# Patient Record
Sex: Male | Born: 1950 | Race: White | Hispanic: No | Marital: Single | Smoking: Never smoker
Health system: Western US, Academic
[De-identification: ages and names within clinical notes are randomized; demographics above are authoritative.]

## PROBLEM LIST (undated history)

## (undated) ENCOUNTER — Ambulatory Visit (HOSPITAL_BASED_OUTPATIENT_CLINIC_OR_DEPARTMENT_OTHER): Payer: Self-pay | Admitting: Anesthesiology

## (undated) ENCOUNTER — Ambulatory Visit (HOSPITAL_COMMUNITY): Payer: Self-pay | Admitting: Urology

## (undated) ENCOUNTER — Encounter (HOSPITAL_BASED_OUTPATIENT_CLINIC_OR_DEPARTMENT_OTHER): Admission: RE | Payer: Self-pay | Source: Ambulatory Visit

## (undated) ENCOUNTER — Ambulatory Visit (HOSPITAL_BASED_OUTPATIENT_CLINIC_OR_DEPARTMENT_OTHER): Payer: Medicaid Other | Admitting: Pain Medicine

## (undated) ENCOUNTER — Encounter (HOSPITAL_BASED_OUTPATIENT_CLINIC_OR_DEPARTMENT_OTHER): Payer: Self-pay

## (undated) ENCOUNTER — Ambulatory Visit (HOSPITAL_BASED_OUTPATIENT_CLINIC_OR_DEPARTMENT_OTHER): Payer: Self-pay | Admitting: Vascular & Interventional Radiology

## (undated) ENCOUNTER — Encounter (HOSPITAL_COMMUNITY): Payer: Self-pay

## (undated) ENCOUNTER — Ambulatory Visit (HOSPITAL_BASED_OUTPATIENT_CLINIC_OR_DEPARTMENT_OTHER): Payer: Medicare Other | Admitting: Anesthesiology

## (undated) ENCOUNTER — Ambulatory Visit (HOSPITAL_BASED_OUTPATIENT_CLINIC_OR_DEPARTMENT_OTHER): Admission: RE | Payer: Medicare Other | Source: Ambulatory Visit

## (undated) DIAGNOSIS — M25511 Pain in right shoulder: Secondary | ICD-10-CM

## (undated) DIAGNOSIS — I700 Atherosclerosis of aorta: Secondary | ICD-10-CM

## (undated) DIAGNOSIS — Z87891 Personal history of nicotine dependence: Secondary | ICD-10-CM

## (undated) DIAGNOSIS — R011 Cardiac murmur, unspecified: Secondary | ICD-10-CM

## (undated) DIAGNOSIS — J449 Chronic obstructive pulmonary disease, unspecified: Secondary | ICD-10-CM

## (undated) DIAGNOSIS — I6529 Occlusion and stenosis of unspecified carotid artery: Secondary | ICD-10-CM

## (undated) DIAGNOSIS — K5730 Diverticulosis of large intestine without perforation or abscess without bleeding: Secondary | ICD-10-CM

## (undated) DIAGNOSIS — I351 Nonrheumatic aortic (valve) insufficiency: Secondary | ICD-10-CM

## (undated) DIAGNOSIS — T7840XA Allergy, unspecified, initial encounter: Secondary | ICD-10-CM

## (undated) DIAGNOSIS — K760 Fatty (change of) liver, not elsewhere classified: Secondary | ICD-10-CM

## (undated) DIAGNOSIS — I2510 Atherosclerotic heart disease of native coronary artery without angina pectoris: Secondary | ICD-10-CM

## (undated) DIAGNOSIS — M1990 Unspecified osteoarthritis, unspecified site: Secondary | ICD-10-CM

## (undated) DIAGNOSIS — E785 Hyperlipidemia, unspecified: Secondary | ICD-10-CM

## (undated) DIAGNOSIS — K5792 Diverticulitis of intestine, part unspecified, without perforation or abscess without bleeding: Secondary | ICD-10-CM

## (undated) DIAGNOSIS — G510 Bell's palsy: Principal | ICD-10-CM

## (undated) DIAGNOSIS — R51 Headache: Secondary | ICD-10-CM

## (undated) DIAGNOSIS — Q620 Congenital hydronephrosis: Secondary | ICD-10-CM

## (undated) DIAGNOSIS — N289 Disorder of kidney and ureter, unspecified: Secondary | ICD-10-CM

## (undated) DIAGNOSIS — Z9359 Other cystostomy status: Secondary | ICD-10-CM

## (undated) DIAGNOSIS — M109 Gout, unspecified: Secondary | ICD-10-CM

## (undated) DIAGNOSIS — N200 Calculus of kidney: Secondary | ICD-10-CM

## (undated) DIAGNOSIS — H3320 Serous retinal detachment, unspecified eye: Secondary | ICD-10-CM

## (undated) DIAGNOSIS — N35919 Unspecified urethral stricture, male, unspecified site: Secondary | ICD-10-CM

## (undated) DIAGNOSIS — F329 Major depressive disorder, single episode, unspecified: Secondary | ICD-10-CM

## (undated) DIAGNOSIS — M130 Polyarthritis, unspecified: Secondary | ICD-10-CM

## (undated) DIAGNOSIS — R519 Headache, unspecified: Secondary | ICD-10-CM

## (undated) DIAGNOSIS — R319 Hematuria, unspecified: Secondary | ICD-10-CM

## (undated) DIAGNOSIS — M549 Dorsalgia, unspecified: Secondary | ICD-10-CM

## (undated) DIAGNOSIS — G8929 Other chronic pain: Secondary | ICD-10-CM

## (undated) HISTORY — PX: TRANSURETHRAL RESECTION OF PROSTATE: SHX73

## (undated) HISTORY — DX: Headache, unspecified: R51.9

## (undated) HISTORY — PX: APPENDECTOMY: SHX54

## (undated) HISTORY — DX: Headache: R51

## (undated) HISTORY — DX: Polyarthritis, unspecified: M13.0

## (undated) HISTORY — DX: Unspecified urethral stricture, male, unspecified site: N35.919

## (undated) HISTORY — DX: Hematuria, unspecified: R31.9

## (undated) HISTORY — PX: OTHER SURGICAL HISTORY: SHX170

## (undated) HISTORY — DX: Disorder of kidney and ureter, unspecified: N28.9

## (undated) HISTORY — DX: Calculus of kidney: N20.0

## (undated) HISTORY — DX: Congenital hydronephrosis: Q62.0

## (undated) HISTORY — PX: COLONOSCOPY: SHX174

## (undated) HISTORY — PX: CYSTOSCOPY W/ LASER LITHOTRIPSY: SHX1425

## (undated) HISTORY — DX: Other chronic pain: M54.9

## (undated) HISTORY — PX: SPINE SURGERY: SHX786

## (undated) HISTORY — DX: Other chronic pain: G89.29

## (undated) HISTORY — DX: Major depressive disorder, single episode, unspecified: F32.9

## (undated) HISTORY — DX: Other cystostomy status (CMS-HCC): Z93.59

## (undated) HISTORY — PX: CYSTOSCOPY: SHX1422B

## (undated) HISTORY — DX: Serous retinal detachment, unspecified eye: H33.20

## (undated) HISTORY — DX: Gout, unspecified: M10.9

## (undated) HISTORY — DX: Bell's palsy: G51.0

## (undated) HISTORY — DX: Pain in right shoulder: M25.511

## (undated) HISTORY — DX: Cardiac murmur, unspecified: R01.1

## (undated) HISTORY — DX: Personal history of nicotine dependence: Z87.891

## (undated) HISTORY — DX: Atherosclerotic heart disease of native coronary artery without angina pectoris: I25.10

## (undated) HISTORY — DX: Chronic obstructive pulmonary disease, unspecified: J44.9

## (undated) HISTORY — DX: Allergy, unspecified, initial encounter: T78.40XA

## (undated) HISTORY — PX: INCISE AND DRAIN ABCESS: PRO64

## (undated) HISTORY — DX: Diverticulitis of intestine, part unspecified, without perforation or abscess without bleeding: K57.92

## (undated) HISTORY — DX: Fatty (change of) liver, not elsewhere classified: K76.0

## (undated) HISTORY — PX: VASECTOMY: SHX75

## (undated) HISTORY — DX: Diverticulosis of large intestine without perforation or abscess without bleeding: K57.30

## (undated) HISTORY — DX: Nonrheumatic aortic (valve) insufficiency: I35.1

## (undated) HISTORY — DX: Atherosclerosis of aorta: I70.0

## (undated) HISTORY — DX: Hyperlipidemia, unspecified: E78.5

## (undated) HISTORY — DX: Unspecified osteoarthritis, unspecified site: M19.90

## (undated) HISTORY — DX: Occlusion and stenosis of unspecified carotid artery: I65.29

## (undated) SURGERY — PAIN INJECTION, SACROILIAC JOINT
Laterality: Bilateral

## (undated) SURGERY — CYSTOURETHROSCOPY, WITH DIRECT VISION INTERNAL URETHROTOMY
Anesthesia: General | Site: Urethra

## (undated) SURGERY — PAIN ESI LUMBAR TRANSFORAMINAL

## (undated) SURGERY — IR OTPT FLOURO CONSULT
Anesthesia: Local

## (undated) SURGERY — IR TUBE CHECK
Anesthesia: Local | Laterality: Left

## (undated) MED ORDER — ONDANSETRON HCL 4 MG/2ML IV SOLN
4.00 mg | INTRAMUSCULAR | Status: AC | PRN
Start: 2022-03-28 — End: 2022-03-28

## (undated) MED ORDER — EPINEPHRINE 0.3 MG/0.3ML IJ SOAJ
0.30 mg | INTRAMUSCULAR | Status: AC | PRN
Start: 2023-07-27 — End: 2023-07-27

## (undated) MED ORDER — ONDANSETRON HCL 4 MG/2ML IV SOLN
8.00 mg | Freq: Once | INTRAMUSCULAR | Status: AC
Start: 2023-02-17 — End: 2023-02-17

## (undated) MED ORDER — ACETAMINOPHEN 325 MG PO TABS
975.00 mg | ORAL_TABLET | Freq: Three times a day (TID) | ORAL | 0 refills | Status: AC
Start: 2019-09-10 — End: ?

## (undated) MED ORDER — GABAPENTIN 300 MG OR CAPS
300.00 mg | ORAL_CAPSULE | Freq: Three times a day (TID) | ORAL | 0 refills | Status: AC
Start: 2019-09-10 — End: ?

## (undated) MED ORDER — DULOXETINE HCL 20 MG OR CPEP
20.00 mg | ORAL_CAPSULE | Freq: Every day | ORAL | 3 refills | Status: AC
Start: 2022-06-26 — End: ?

## (undated) MED ORDER — SODIUM CHLORIDE 0.9 % IV SOLN
Freq: Once | INTRAVENOUS | Status: AC
Start: 2023-04-13 — End: 2023-03-30

## (undated) MED ORDER — LIDOCAINE ATOMIZER SYRINGE 4% SOLUTION (~~LOC~~)
0.50 mL | Freq: Once | Status: AC
Start: 2023-05-01 — End: 2023-05-01

## (undated) MED ORDER — PREGABALIN 100 MG OR CAPS
ORAL_CAPSULE | ORAL | 0 refills | Status: AC
Start: 2020-12-28 — End: ?

## (undated) MED ORDER — LIDOCAINE ATOMIZER SYRINGE 4% SOLUTION (~~LOC~~)
0.50 mL | Freq: Once | Status: AC
Start: 2023-05-02 — End: 2023-05-02

## (undated) MED ORDER — SODIUM CHLORIDE 0.9 % IV SOLN
INTRAVENOUS | Status: AC | PRN
Start: 2023-06-01 — End: ?

## (undated) MED ORDER — OXYMETAZOLINE HCL 0.05 % NA SOLN
2.00 | Freq: Once | NASAL | Status: AC
Start: 2023-05-02 — End: 2023-05-02

## (undated) MED ORDER — SODIUM CHLORIDE 0.9 % IV BOLUS
500.00 mL | INJECTION | Freq: Once | INTRAVENOUS | Status: AC | PRN
Start: 2023-02-17 — End: ?

## (undated) MED ORDER — SODIUM CHLORIDE 0.9 % IV SOLN
2000.0000 mg | Freq: Once | INTRAVENOUS | Status: AC
Start: 2021-10-26 — End: 2021-10-26

## (undated) MED ORDER — SODIUM CHLORIDE 0.9 % IV SOLN
INTRAVENOUS | Status: AC | PRN
Start: 2023-07-06 — End: ?

## (undated) MED ORDER — PREGABALIN 100 MG OR CAPS
100.0000 mg | ORAL_CAPSULE | Freq: Two times a day (BID) | ORAL | 0 refills | Status: AC
Start: 2023-07-03 — End: ?

## (undated) MED ORDER — PREGABALIN 75 MG OR CAPS
ORAL_CAPSULE | ORAL | 5 refills | Status: AC
Start: 2020-12-28 — End: ?

## (undated) MED ORDER — ONDANSETRON HCL 4 MG/2ML IV SOLN
8.00 mg | Freq: Once | INTRAMUSCULAR | Status: AC
Start: 2023-01-20 — End: 2023-01-20

## (undated) MED ORDER — LACTATED RINGERS IV SOLN
INTRAVENOUS | Status: AC
Start: 2021-04-29 — End: ?

## (undated) MED ORDER — LIDOCAINE VISCOUS 2 % MT SOLN
15.00 mL | Freq: Once | OROMUCOSAL | Status: AC
Start: 2023-05-02 — End: 2023-05-02

## (undated) MED ORDER — HYDROCORTISONE SOD SUCCINATE 100 MG IJ SOLR (CUSTOM)
100.00 mg | Freq: Once | INTRAMUSCULAR | Status: AC | PRN
Start: 2023-01-20 — End: 2023-01-20

## (undated) MED ORDER — ONDANSETRON HCL 4 MG/2ML IV SOLN
8.00 mg | Freq: Once | INTRAMUSCULAR | Status: AC
Start: 2023-06-01 — End: 2023-06-01

## (undated) MED ORDER — HEPARIN SODIUM (PORCINE) 5000 UNIT/ML IJ SOLN
5000.00 [IU] | Freq: Once | INTRAMUSCULAR | Status: AC
Start: 2022-08-18 — End: 2022-08-18

## (undated) MED ORDER — HEPARIN SODIUM (PORCINE) 5000 UNIT/ML IJ SOLN
5000.0000 [IU] | Freq: Once | INTRAMUSCULAR | Status: AC
Start: 2021-05-18 — End: 2021-05-18

## (undated) MED ORDER — ONDANSETRON HCL 4 MG/2ML IV SOLN
8.00 mg | Freq: Once | INTRAMUSCULAR | Status: AC
Start: 2023-04-13 — End: 2023-03-30

## (undated) MED ORDER — SODIUM CHLORIDE 0.9 % IV BOLUS
1000.0000 mL | INJECTION | Freq: Once | INTRAVENOUS | Status: AC
Start: 2023-02-17 — End: 2023-02-17

## (undated) MED ORDER — EPINEPHRINE 0.3 MG/0.3ML IJ SOAJ
0.30 mg | INTRAMUSCULAR | Status: AC | PRN
Start: 2023-02-17 — End: 2023-02-17

## (undated) MED ORDER — DIPHENHYDRAMINE HCL 50 MG/ML IJ SOLN
25.00 mg | Freq: Once | INTRAMUSCULAR | Status: AC | PRN
Start: 2023-07-20 — End: 2023-07-20

## (undated) MED ORDER — SODIUM CHLORIDE 0.9 % IV SOLN
0.75 mg/kg | Freq: Once | INTRAVENOUS | Status: AC
Start: 2023-06-08 — End: 2023-06-08

## (undated) MED ORDER — HEPARIN SODIUM LOCK FLUSH 100 UNIT/ML IJ SOLN
500.00 [IU] | Freq: Once | INTRAVENOUS | Status: AC | PRN
Start: 2022-03-28 — End: ?

## (undated) MED ORDER — ONDANSETRON HCL 4 MG/2ML IV SOLN
8.00 mg | Freq: Once | INTRAMUSCULAR | Status: AC
Start: 2023-07-27 — End: 2023-07-27

## (undated) MED ORDER — CELECOXIB 200 MG OR CAPS
200.00 mg | ORAL_CAPSULE | Freq: Once | ORAL | Status: AC
Start: 2022-08-18 — End: 2022-08-18

## (undated) MED ORDER — LIDOCAINE HCL 2% EX GEL (UROJET)
10.00 mL | Freq: Once | Status: AC | PRN
Start: 2022-04-18 — End: 2022-03-29

## (undated) MED ORDER — CELECOXIB 200 MG OR CAPS
200.00 mg | ORAL_CAPSULE | Freq: Once | ORAL | Status: AC
Start: 2022-05-03 — End: 2022-05-03

## (undated) MED ORDER — FAMOTIDINE (PF) 20 MG/2ML IV SOLN
20.00 mg | Freq: Once | INTRAVENOUS | Status: AC | PRN
Start: 2023-01-27 — End: 2023-01-27

## (undated) MED ORDER — ONDANSETRON HCL 4 MG/2ML IV SOLN
8.00 mg | Freq: Once | INTRAMUSCULAR | Status: AC
Start: 2023-03-03 — End: 2023-03-03

## (undated) MED ORDER — ALLOPURINOL 100 MG OR TABS
100.00 mg | ORAL_TABLET | Freq: Every day | ORAL | 0 refills | Status: AC
Start: 2023-09-18 — End: ?

## (undated) MED ORDER — CELECOXIB 200 MG OR CAPS
200.0000 mg | ORAL_CAPSULE | Freq: Once | ORAL | Status: AC
Start: 2021-05-18 — End: 2021-05-18

## (undated) MED ORDER — LACTATED RINGERS IV SOLN
INTRAVENOUS | Status: AC
Start: 2021-10-26 — End: ?

## (undated) MED ORDER — SODIUM CHLORIDE 0.9 % IV SOLN
Freq: Once | INTRAVENOUS | Status: AC
Start: 2023-07-20 — End: 2023-07-20

## (undated) MED ORDER — DULOXETINE HCL 20 MG OR CPEP
20.0000 mg | ORAL_CAPSULE | Freq: Every day | ORAL | 3 refills | Status: AC
Start: 2022-06-26 — End: ?

## (undated) MED ORDER — OXYMETAZOLINE HCL 0.05 % NA SOLN
2.00 | Freq: Once | NASAL | Status: AC
Start: 2023-05-01 — End: 2023-05-01

## (undated) MED ORDER — SODIUM CHLORIDE 0.9 % IV SOLN
INTRAVENOUS | Status: AC | PRN
Start: 2023-03-11 — End: ?

## (undated) MED ORDER — SODIUM CHLORIDE (PF) 0.9 % IJ SOLN
17.0000 mg | Freq: Once | INTRAVESICAL | Status: AC
Start: 2022-08-23 — End: 2022-08-16

## (undated) MED ORDER — ENFORTUMAB VEDOTIN-EJFV 30 MG IV SOLR
70.00 mg | Freq: Once | INTRAVENOUS | Status: AC
Start: 2023-05-18 — End: 2023-05-04

## (undated) MED ORDER — SODIUM CHLORIDE 0.9 % IV SOLN
Freq: Once | INTRAVENOUS | Status: AC
Start: 2023-01-20 — End: 2023-01-20

## (undated) MED ORDER — FAMOTIDINE (PF) 20 MG/2ML IV SOLN
20.00 mg | Freq: Once | INTRAVENOUS | Status: AC | PRN
Start: 2023-04-06 — End: 2023-03-23

## (undated) MED ORDER — FAMOTIDINE (PF) 20 MG/2ML IV SOLN
20.00 mg | Freq: Once | INTRAVENOUS | Status: AC | PRN
Start: 2023-07-27 — End: 2023-07-27

## (undated) MED ORDER — DIPHENHYDRAMINE HCL 50 MG/ML IJ SOLN
25.00 mg | Freq: Once | INTRAMUSCULAR | Status: AC | PRN
Start: 2023-01-20 — End: 2023-01-20

## (undated) MED ORDER — SODIUM CHLORIDE 0.9 % IV SOLN
1.00 mg/kg | Freq: Once | INTRAVENOUS | Status: AC
Start: 2023-04-06 — End: 2023-03-23

## (undated) MED ORDER — ALBUTEROL SULFATE (5 MG/ML) 0.5% IN NEBU
2.50 mg | INHALATION_SOLUTION | Freq: Once | RESPIRATORY_TRACT | Status: AC | PRN
Start: 2023-01-20 — End: 2023-01-20

## (undated) MED ORDER — OXYCODONE HCL 5 MG OR TABS
5.00 mg | ORAL_TABLET | ORAL | 0 refills | Status: AC | PRN
Start: 2022-06-26 — End: ?

## (undated) MED ORDER — DIPHENHYDRAMINE HCL 50 MG/ML IJ SOLN
25.00 mg | Freq: Once | INTRAMUSCULAR | Status: AC | PRN
Start: 2023-06-08 — End: 2023-06-08

## (undated) MED ORDER — EPINEPHRINE 0.3 MG/0.3ML IJ SOAJ
0.30 mg | INTRAMUSCULAR | Status: AC | PRN
Start: 2023-04-06 — End: 2023-03-23

## (undated) MED ORDER — OXYCODONE HCL 10 MG OR TABS
ORAL_TABLET | ORAL | 0 refills | Status: AC
Start: 2019-10-22 — End: ?

## (undated) MED ORDER — HYDROMORPHONE HCL 1 MG/ML IJ SOLN
0.50 mg | Freq: Once | INTRAMUSCULAR | Status: AC | PRN
Start: 2023-01-20 — End: ?

## (undated) MED ORDER — SODIUM CHLORIDE 0.9 % IV SOLN
200.0000 mg | Freq: Once | INTRAVENOUS | Status: AC
Start: 2023-06-29 — End: 2023-06-29

## (undated) MED ORDER — ALBUTEROL SULFATE (5 MG/ML) 0.5% IN NEBU
2.5000 mg | INHALATION_SOLUTION | Freq: Once | RESPIRATORY_TRACT | Status: AC | PRN
Start: 2023-06-29 — End: 2023-06-29

## (undated) MED ORDER — ENFORTUMAB VEDOTIN-EJFV 30 MG IV SOLR
50.00 mg | Freq: Once | INTRAVENOUS | Status: AC
Start: 2023-07-20 — End: 2023-07-20

## (undated) MED ORDER — DIPHENHYDRAMINE HCL 50 MG/ML IJ SOLN
25.0000 mg | Freq: Once | INTRAMUSCULAR | Status: AC | PRN
Start: 2023-07-06 — End: 2023-07-06

## (undated) MED ORDER — DIPHENHYDRAMINE HCL 50 MG/ML IJ SOLN
25.00 mg | Freq: Once | INTRAMUSCULAR | Status: AC | PRN
Start: 2023-08-11 — End: 2023-08-11

## (undated) MED ORDER — BUSPIRONE HCL 10 MG OR TABS
ORAL_TABLET | ORAL | 0 refills | Status: AC
Start: 2023-07-02 — End: ?

## (undated) MED ORDER — LIDOCAINE VISCOUS 2 % MT SOLN
15.00 mL | Freq: Once | OROMUCOSAL | Status: AC
Start: 2023-05-01 — End: 2023-05-01

## (undated) MED ORDER — CEFAZOLIN SODIUM-DEXTROSE 2-3 GM-%(50ML) IV SOLR
2000.00 mg | Freq: Once | INTRAVENOUS | Status: AC
Start: 2022-05-03 — End: 2022-05-03

## (undated) MED ORDER — EPINEPHRINE 0.3 MG/0.3ML IJ SOAJ
0.30 mg | INTRAMUSCULAR | Status: AC | PRN
Start: 2023-07-20 — End: 2023-07-20

## (undated) MED ORDER — SODIUM CHLORIDE (PF) 0.9 % IJ SOLN
50.0000 mg | Freq: Once | INTRAVESICAL | Status: AC
Start: 2022-08-09 — End: 2022-08-09

## (undated) MED ORDER — SODIUM CHLORIDE 0.9 % IV SOLN
200.00 mg | Freq: Once | INTRAVENOUS | Status: AC
Start: 2023-04-06 — End: 2023-03-23

## (undated) MED ORDER — MITOMYCIN (JELMYTO) 4 MG/ML PYELOCALYCEAL SOLUTION BLADDER INSTILLATION
60.0000 mg | Freq: Once | INTRAVESICAL | Status: AC
Start: 2021-08-05 — End: 2021-08-05

## (undated) MED ORDER — ENFORTUMAB VEDOTIN-EJFV 30 MG IV SOLR
70.00 mg | Freq: Once | INTRAVENOUS | Status: AC
Start: 2023-05-11 — End: 2023-04-27

## (undated) MED ORDER — LIDOCAINE 4 % EX PTCH
1.0000 | MEDICATED_PATCH | CUTANEOUS | 0 refills | Status: AC
Start: 2021-12-06 — End: ?

## (undated) MED ORDER — FAMOTIDINE (PF) 20 MG/2ML IV SOLN
20.00 mg | Freq: Once | INTRAVENOUS | Status: AC | PRN
Start: 2023-07-20 — End: 2023-07-20

## (undated) MED ORDER — SODIUM CHLORIDE (PF) 0.9 % IJ SOLN
50.0000 mg | Freq: Once | INTRAVESICAL | Status: AC
Start: 2022-07-29 — End: 2022-07-29

## (undated) MED ORDER — HYDROMORPHONE HCL 1 MG/ML IJ SOLN
1.00 mg | INTRAMUSCULAR | Status: AC | PRN
Start: 2022-03-28 — End: 2022-03-28

## (undated) MED ORDER — ZOLPIDEM TARTRATE 5 MG OR TABS
5.00 mg | ORAL_TABLET | Freq: Every evening | ORAL | 0 refills | Status: AC | PRN
Start: 2020-06-10 — End: ?

## (undated) MED ORDER — ALBUTEROL SULFATE (5 MG/ML) 0.5% IN NEBU
2.50 mg | INHALATION_SOLUTION | Freq: Once | RESPIRATORY_TRACT | Status: AC | PRN
Start: 2023-02-17 — End: 2023-02-17

## (undated) MED ORDER — ALBUTEROL SULFATE (5 MG/ML) 0.5% IN NEBU
2.50 mg | INHALATION_SOLUTION | Freq: Once | RESPIRATORY_TRACT | Status: AC | PRN
Start: 2023-03-11 — End: 2023-03-10

## (undated) MED ORDER — SODIUM CHLORIDE 0.9 % IV SOLN
200.00 mg | Freq: Once | INTRAVENOUS | Status: AC
Start: 2023-06-01 — End: 2023-06-01

## (undated) MED ORDER — SODIUM CHLORIDE 0.9 % IJ SOLN
50.00 mg | Freq: Once | INTRAMUSCULAR | Status: AC
Start: 2022-03-22 — End: 2022-03-08

## (undated) MED ORDER — LACTATED RINGERS IV SOLN
INTRAVENOUS | Status: AC
Start: 2021-05-18 — End: ?

## (undated) MED ORDER — HYDROCORTISONE SOD SUCCINATE 100 MG IJ SOLR (CUSTOM)
100.00 mg | Freq: Once | INTRAMUSCULAR | Status: AC | PRN
Start: 2023-08-11 — End: 2023-08-11

## (undated) MED ORDER — BCG LIVE 50 MG IS SUSR
50.00 mg | Freq: Once | INTRAVESICAL | Status: AC
Start: 2022-08-16 — End: 2022-08-02

## (undated) MED ORDER — SODIUM CHLORIDE (PF) 0.9 % IJ SOLN
50.0000 mg | Freq: Once | INTRAVESICAL | Status: AC
Start: 2022-08-16 — End: 2022-08-16

## (undated) MED ORDER — SODIUM CHLORIDE 0.9 % IV BOLUS
500.00 mL | INJECTION | Freq: Once | INTRAVENOUS | Status: AC | PRN
Start: 2023-08-11 — End: ?

## (undated) MED ORDER — EPINEPHRINE 0.3 MG/0.3ML IJ SOAJ
0.30 mg | INTRAMUSCULAR | Status: AC | PRN
Start: 2023-01-20 — End: 2023-01-20

## (undated) MED ORDER — SODIUM CHLORIDE 0.9 % IV BOLUS
500.0000 mL | INJECTION | Freq: Once | INTRAVENOUS | Status: AC | PRN
Start: 2023-05-11 — End: ?

## (undated) MED ORDER — HYDROXYZINE HCL 25 MG OR TABS
25.0000 mg | ORAL_TABLET | Freq: Three times a day (TID) | ORAL | 0 refills | Status: AC | PRN
Start: 2023-05-04 — End: ?

## (undated) MED ORDER — EPINEPHRINE 0.3 MG/0.3ML IJ SOAJ
0.30 mg | INTRAMUSCULAR | Status: AC | PRN
Start: 2023-04-13 — End: 2023-03-30

## (undated) MED ORDER — LACTATED RINGERS IV SOLN
INTRAVENOUS | Status: AC
Start: 2021-06-25 — End: ?

## (undated) MED ORDER — PEMBROLIZUMAB 100 MG/4ML IV SOLN
200.00 mg | Freq: Once | INTRAVENOUS | Status: AC
Start: 2023-07-20 — End: 2023-07-20

## (undated) MED ORDER — OXYBUTYNIN CHLORIDE 5 MG OR TABS
5.0000 mg | ORAL_TABLET | Freq: Three times a day (TID) | ORAL | 0 refills | Status: AC
Start: 2020-08-25 — End: ?

## (undated) MED ORDER — ALBUTEROL SULFATE (5 MG/ML) 0.5% IN NEBU
2.50 mg | INHALATION_SOLUTION | Freq: Once | RESPIRATORY_TRACT | Status: AC | PRN
Start: 2023-06-08 — End: 2023-06-08

## (undated) MED ORDER — PREGABALIN 100 MG OR CAPS
ORAL_CAPSULE | ORAL | 0 refills | Status: AC
Start: 2021-11-17 — End: ?

## (undated) MED ORDER — ALBUTEROL SULFATE (5 MG/ML) 0.5% IN NEBU
2.50 mg | INHALATION_SOLUTION | Freq: Once | RESPIRATORY_TRACT | Status: AC | PRN
Start: 2023-04-13 — End: 2023-03-30

## (undated) MED ORDER — SODIUM CHLORIDE 0.9 % IV SOLN
50.00 mg | Freq: Once | INTRAVENOUS | Status: AC
Start: 2023-07-27 — End: 2023-07-27

## (undated) MED ORDER — HYDROCORTISONE SOD SUCCINATE 100 MG IJ SOLR (CUSTOM)
100.00 mg | Freq: Once | INTRAMUSCULAR | Status: AC | PRN
Start: 2023-07-27 — End: 2023-07-27

## (undated) MED ORDER — SENNA 8.6 MG OR TABS
17.20 mg | ORAL_TABLET | Freq: Every morning | ORAL | 0 refills | Status: AC
Start: 2019-09-10 — End: ?

## (undated) MED ORDER — SODIUM CHLORIDE 0.9 % IV SOLN
Freq: Once | INTRAVENOUS | Status: AC
Start: 2023-06-29 — End: 2023-06-29

## (undated) MED ORDER — DIPHENHYDRAMINE HCL 50 MG/ML IJ SOLN
25.00 mg | Freq: Once | INTRAMUSCULAR | Status: AC | PRN
Start: 2023-03-03 — End: 2023-03-03

## (undated) MED ORDER — EPINEPHRINE 0.3 MG/0.3ML IJ SOAJ
0.3000 mg | INTRAMUSCULAR | Status: AC | PRN
Start: 2023-07-06 — End: 2023-07-06

## (undated) MED ORDER — SODIUM CHLORIDE 0.9 % IV SOLN
INTRAVENOUS | Status: AC | PRN
Start: 2023-07-27 — End: ?

## (undated) MED ORDER — BCG LIVE 50 MG IS SUSR
50.00 mg | Freq: Once | INTRAVESICAL | Status: AC
Start: 2022-07-26 — End: 2022-07-12

## (undated) MED ORDER — ACETAMINOPHEN 325 MG PO TABS
975.0000 mg | ORAL_TABLET | Freq: Once | ORAL | Status: AC
Start: 2021-05-18 — End: 2021-05-18

## (undated) MED ORDER — ONDANSETRON HCL 4 MG/2ML IV SOLN
8.0000 mg | Freq: Once | INTRAMUSCULAR | Status: AC
Start: 2023-07-06 — End: 2023-07-06

## (undated) MED ORDER — ALBUTEROL SULFATE (5 MG/ML) 0.5% IN NEBU
2.5000 mg | INHALATION_SOLUTION | Freq: Once | RESPIRATORY_TRACT | Status: AC | PRN
Start: 2023-05-11 — End: 2023-04-27

## (undated) MED ORDER — BUPIVACAINE HCL (PF) 0.25 % IJ SOLN
1.00 mL | Freq: Once | INTRAMUSCULAR | Status: AC
Start: 2018-06-26 — End: 2018-06-26

## (undated) MED ORDER — DIPHENHYDRAMINE HCL 50 MG/ML IJ SOLN
25.00 mg | Freq: Once | INTRAMUSCULAR | Status: AC | PRN
Start: 2023-03-11 — End: 2023-03-10

## (undated) MED ORDER — EPINEPHRINE 0.3 MG/0.3ML IJ SOAJ
0.30 mg | INTRAMUSCULAR | Status: AC | PRN
Start: 2023-03-03 — End: 2023-03-03

## (undated) MED ORDER — OXYCODONE HCL 5 MG OR TABS
ORAL_TABLET | ORAL | 0 refills | Status: AC
Start: 2019-09-10 — End: ?

## (undated) MED ORDER — SODIUM CHLORIDE 0.9 % IV BOLUS
500.00 mL | INJECTION | Freq: Once | INTRAVENOUS | Status: AC | PRN
Start: 2023-03-11 — End: ?

## (undated) MED ORDER — OXYCODONE HCL 5 MG OR TABS
5.0000 mg | ORAL_TABLET | ORAL | 0 refills | Status: AC | PRN
Start: 2020-10-20 — End: ?

## (undated) MED ORDER — EPINEPHRINE 0.3 MG/0.3ML IJ SOAJ
0.3000 mg | INTRAMUSCULAR | Status: AC | PRN
Start: 2023-06-29 — End: 2023-06-29

## (undated) MED ORDER — SODIUM CHLORIDE 0.9 % IV SOLN
1.25 mg/kg | Freq: Once | INTRAVENOUS | Status: AC
Start: 2023-03-10 — End: 2023-03-10

## (undated) MED ORDER — ENFORTUMAB VEDOTIN-EJFV 30 MG IV SOLR
0.75 mg/kg | Freq: Once | INTRAVENOUS | Status: AC
Start: 2023-06-01 — End: 2023-06-01

## (undated) MED ORDER — ALBUTEROL SULFATE (5 MG/ML) 0.5% IN NEBU
2.50 mg | INHALATION_SOLUTION | Freq: Once | RESPIRATORY_TRACT | Status: AC | PRN
Start: 2023-08-11 — End: 2023-08-11

## (undated) MED ORDER — TAMSULOSIN HCL 0.4 MG PO CAPS
0.4000 mg | ORAL_CAPSULE | Freq: Every day | ORAL | 3 refills | Status: AC
Start: 2021-12-31 — End: ?

## (undated) MED ORDER — ALBUTEROL SULFATE (5 MG/ML) 0.5% IN NEBU
2.50 mg | INHALATION_SOLUTION | Freq: Once | RESPIRATORY_TRACT | Status: AC | PRN
Start: 2023-04-06 — End: 2023-03-23

## (undated) MED ORDER — SODIUM CHLORIDE 0.9 % IV SOLN
INTRAVENOUS | Status: AC | PRN
Start: 2023-01-20 — End: ?

## (undated) MED ORDER — ONDANSETRON HCL 4 MG/2ML IV SOLN
8.00 mg | Freq: Once | INTRAMUSCULAR | Status: AC
Start: 2023-07-20 — End: 2023-07-20

## (undated) MED ORDER — BUPIVACAINE HCL (PF) 0.25 % IJ SOLN
10.0000 mL | Freq: Once | INTRAMUSCULAR | Status: AC
Start: 2021-06-28 — End: 2021-06-28

## (undated) MED ORDER — ONDANSETRON HCL 4 MG/2ML IV SOLN
8.00 mg | Freq: Once | INTRAMUSCULAR | Status: AC
Start: 2023-08-11 — End: 2023-08-11

## (undated) MED ORDER — SODIUM CHLORIDE 0.9 % IV SOLN
1.25 mg/kg | Freq: Once | INTRAVENOUS | Status: AC
Start: 2023-02-17 — End: 2023-02-17

## (undated) MED ORDER — HYDROXYZINE PAMOATE 25 MG OR CAPS
25.0000 mg | ORAL_CAPSULE | Freq: Three times a day (TID) | ORAL | 0 refills | Status: AC | PRN
Start: 2023-05-04 — End: ?

## (undated) MED ORDER — SODIUM CHLORIDE (PF) 0.9 % IJ SOLN
50.00 mg | Freq: Once | INTRAMUSCULAR | Status: AC
Start: 2022-08-02 — End: 2022-07-19

## (undated) MED ORDER — HYDROCORTISONE SOD SUCCINATE 100 MG IJ SOLR (CUSTOM)
100.00 mg | Freq: Once | INTRAMUSCULAR | Status: AC | PRN
Start: 2023-03-03 — End: 2023-03-03

## (undated) MED ORDER — HEPARIN SODIUM (PORCINE) 5000 UNIT/ML IJ SOLN
5000.0000 [IU] | Freq: Once | INTRAMUSCULAR | Status: AC
Start: 2021-10-26 — End: 2021-10-26

## (undated) MED ORDER — ZOLPIDEM TARTRATE 5 MG OR TABS
5.0000 mg | ORAL_TABLET | Freq: Every evening | ORAL | 0 refills | Status: AC | PRN
Start: 2020-10-20 — End: ?

## (undated) MED ORDER — ONDANSETRON HCL 4 MG/2ML IV SOLN
8.0000 mg | Freq: Once | INTRAMUSCULAR | Status: AC
Start: 2023-05-11 — End: 2023-04-27

## (undated) MED ORDER — BCG LIVE 50 MG IS SUSR
50.00 mg | Freq: Once | INTRAVESICAL | Status: AC
Start: 2022-04-04 — End: 2022-03-15

## (undated) MED ORDER — HYDROMORPHONE HCL 2 MG OR TABS
2.0000 mg | ORAL_TABLET | ORAL | 0 refills | Status: AC | PRN
Start: 2023-08-07 — End: 2023-11-05

## (undated) MED ORDER — SODIUM CHLORIDE 0.9 % IV SOLN
INTRAVENOUS | Status: AC | PRN
Start: 2023-05-18 — End: ?

## (undated) MED ORDER — LIDOCAINE HCL 2% EX GEL (UROJET)
10.00 mL | Freq: Once | Status: AC | PRN
Start: 2022-04-25 — End: 2022-04-05

## (undated) MED ORDER — IOHEXOL 240 MG/ML IJ SOLN
1.00 mL | Freq: Once | INTRAMUSCULAR | Status: AC
Start: 2017-10-18 — End: 2017-10-18

## (undated) MED ORDER — SODIUM CHLORIDE 0.9 % IV SOLN
Freq: Once | INTRAVENOUS | Status: AC
Start: 2023-05-18 — End: 2023-05-04

## (undated) MED ORDER — ACETAMINOPHEN 325 MG PO TABS
975.0000 mg | ORAL_TABLET | Freq: Once | ORAL | Status: AC
Start: 2021-06-25 — End: 2021-06-25

## (undated) MED ORDER — SODIUM CHLORIDE 0.9 % IV SOLN
50.00 mg | Freq: Once | INTRAVENOUS | Status: AC
Start: 2023-08-11 — End: 2023-08-11

## (undated) MED ORDER — HYDROMORPHONE HCL 2 MG OR TABS
ORAL_TABLET | ORAL | 0 refills | Status: AC
Start: 2023-09-09 — End: ?

## (undated) MED ORDER — FAMOTIDINE (PF) 20 MG/2ML IV SOLN
20.0000 mg | Freq: Once | INTRAVENOUS | Status: AC | PRN
Start: 2023-05-18 — End: 2023-05-04

## (undated) MED ORDER — HYDROCORTISONE SOD SUCCINATE 100 MG IJ SOLR (CUSTOM)
100.00 mg | Freq: Once | INTRAMUSCULAR | Status: AC | PRN
Start: 2023-04-13 — End: 2023-03-30

## (undated) MED ORDER — SODIUM CHLORIDE 0.9 % IV SOLN
Freq: Once | INTRAVENOUS | Status: AC
Start: 2023-06-08 — End: 2023-06-08

## (undated) MED ORDER — SODIUM CHLORIDE 0.9 % IV BOLUS
500.00 mL | INJECTION | Freq: Once | INTRAVENOUS | Status: AC | PRN
Start: 2023-04-13 — End: ?

## (undated) MED ORDER — FAMOTIDINE (PF) 20 MG/2ML IV SOLN
20.00 mg | Freq: Once | INTRAVENOUS | Status: AC | PRN
Start: 2023-02-17 — End: 2023-02-17

## (undated) MED ORDER — SODIUM CHLORIDE 0.9 % IV SOLN
Freq: Once | INTRAVENOUS | Status: AC
Start: 2023-07-06 — End: 2023-07-06

## (undated) MED ORDER — HYDROCORTISONE SOD SUCCINATE 100 MG IJ SOLR (CUSTOM)
100.00 mg | Freq: Once | INTRAMUSCULAR | Status: AC | PRN
Start: 2023-01-27 — End: 2023-01-27

## (undated) MED ORDER — DIPHENHYDRAMINE HCL 50 MG/ML IJ SOLN
25.00 mg | Freq: Once | INTRAMUSCULAR | Status: AC | PRN
Start: 2023-07-27 — End: 2023-07-27

## (undated) MED ORDER — HYDROXYZINE HCL 10 MG OR TABS
10.0000 mg | ORAL_TABLET | Freq: Three times a day (TID) | ORAL | 0 refills | Status: AC | PRN
Start: 2023-05-04 — End: ?

## (undated) MED ORDER — EPINEPHRINE 0.3 MG/0.3ML IJ SOAJ
0.3000 mg | INTRAMUSCULAR | Status: AC | PRN
Start: 2023-05-11 — End: 2023-04-27

## (undated) MED ORDER — LACTATED RINGERS IV SOLN
INTRAVENOUS | Status: AC
Start: 2019-08-27 — End: ?

## (undated) MED ORDER — PEMBROLIZUMAB 100 MG/4ML IV SOLN
200.00 mg | Freq: Once | INTRAVENOUS | Status: AC
Start: 2023-02-10 — End: 2023-02-10

## (undated) MED ORDER — FAMOTIDINE (PF) 20 MG/2ML IV SOLN
20.00 mg | Freq: Once | INTRAVENOUS | Status: AC | PRN
Start: 2023-06-08 — End: 2023-06-08

## (undated) MED ORDER — ENFORTUMAB VEDOTIN-EJFV 30 MG IV SOLR
100.00 mg | Freq: Once | INTRAVENOUS | Status: AC
Start: 2023-01-27 — End: 2023-01-27

## (undated) MED ORDER — SODIUM CHLORIDE 0.9 % IV SOLN
INTRAVENOUS | Status: AC | PRN
Start: 2023-07-20 — End: ?

## (undated) MED ORDER — SODIUM CHLORIDE 0.9 % IV BOLUS
500.00 mL | INJECTION | Freq: Once | INTRAVENOUS | Status: AC | PRN
Start: 2023-07-27 — End: ?

## (undated) MED ORDER — BCG LIVE 50 MG IS SUSR
16.70 mg | Freq: Once | INTRAVESICAL | Status: AC
Start: 2022-07-25 — End: 2022-07-25

## (undated) MED ORDER — LACTATED RINGERS IV SOLN
INTRAVENOUS | Status: AC
Start: 2022-05-03 — End: ?

## (undated) MED ORDER — ONDANSETRON HCL 4 MG/2ML IV SOLN
8.00 mg | Freq: Once | INTRAMUSCULAR | Status: AC
Start: 2023-06-08 — End: 2023-06-08

## (undated) MED ORDER — ACETAMINOPHEN 325 MG PO TABS
975.0000 mg | ORAL_TABLET | Freq: Once | ORAL | Status: AC
Start: 2021-10-26 — End: 2021-10-26

## (undated) MED ORDER — DIPHENHYDRAMINE HCL 50 MG/ML IJ SOLN
25.00 mg | Freq: Once | INTRAMUSCULAR | Status: AC | PRN
Start: 2023-02-17 — End: 2023-02-17

## (undated) MED ORDER — SODIUM CHLORIDE 0.9 % IV SOLN
Freq: Once | INTRAVENOUS | Status: AC
Start: 2023-08-11 — End: 2023-08-11

## (undated) MED ORDER — GABAPENTIN 100 MG OR CAPS
100.00 mg | ORAL_CAPSULE | Freq: Once | ORAL | Status: AC
Start: 2021-04-29 — End: 2021-04-29

## (undated) MED ORDER — SODIUM CHLORIDE 0.9 % IV BOLUS
500.0000 mL | INJECTION | Freq: Once | INTRAVENOUS | Status: AC | PRN
Start: 2023-06-29 — End: ?

## (undated) MED ORDER — OXYCODONE HCL 5 MG OR TABS
10.0000 mg | ORAL_TABLET | Freq: Four times a day (QID) | ORAL | 0 refills | Status: AC | PRN
Start: 2023-01-19 — End: ?

## (undated) MED ORDER — CELECOXIB 200 MG OR CAPS
200.0000 mg | ORAL_CAPSULE | Freq: Once | ORAL | Status: AC
Start: 2021-10-26 — End: 2021-10-26

## (undated) MED ORDER — SODIUM CHLORIDE (PF) 0.9 % IJ SOLN
50.0000 mg | Freq: Once | INTRAVESICAL | Status: AC
Start: 2022-08-02 — End: 2022-08-02

## (undated) MED ORDER — METOCLOPRAMIDE HCL 5 MG/ML IJ SOLN
10.00 mg | INTRAMUSCULAR | Status: AC | PRN
Start: 2022-03-28 — End: 2022-03-28

## (undated) MED ORDER — SENNA-TIME 8.6 MG OR TABS
ORAL_TABLET | ORAL | 0 refills | Status: AC
Start: 2021-09-16 — End: ?

## (undated) MED ORDER — SODIUM CHLORIDE 0.9 % IV SOLN
80.0000 mg | Freq: Once | INTRAVENOUS | Status: AC
Start: 2023-02-17 — End: 2023-02-17

## (undated) MED ORDER — SODIUM CHLORIDE 0.9 % IV SOLN
Freq: Once | INTRAVENOUS | Status: AC
Start: 2023-01-27 — End: 2023-01-27

## (undated) MED ORDER — OXYCODONE HCL 10 MG OR TABS
10.00 mg | ORAL_TABLET | ORAL | Status: AC | PRN
Start: 2023-04-27 — End: ?

## (undated) MED ORDER — SODIUM CHLORIDE 0.9 % IV SOLN
100.0000 mg | Freq: Once | INTRAVENOUS | Status: AC
Start: 2023-02-10 — End: 2023-02-10

## (undated) MED ORDER — SODIUM CHLORIDE 0.9 % IV SOLN
INTRAVENOUS | Status: AC | PRN
Start: 2023-03-03 — End: ?

## (undated) MED ORDER — SODIUM CHLORIDE 0.9 % IV SOLN
INTRAVENOUS | Status: AC | PRN
Start: 2023-04-13 — End: ?

## (undated) MED ORDER — SODIUM CHLORIDE 0.9 % IJ SOLN
4.00 mg | Freq: Once | INTRAMUSCULAR | Status: AC
Start: 2017-12-07 — End: ?

## (undated) MED ORDER — FAMOTIDINE (PF) 20 MG/2ML IV SOLN
20.0000 mg | Freq: Once | INTRAVENOUS | Status: AC | PRN
Start: 2023-06-29 — End: 2023-06-29

## (undated) MED ORDER — EPINEPHRINE 0.3 MG/0.3ML IJ SOAJ
0.30 mg | INTRAMUSCULAR | Status: AC | PRN
Start: 2023-02-10 — End: 2023-02-10

## (undated) MED ORDER — BCG LIVE 50 MG IS SUSR
50.00 mg | Freq: Once | INTRAVESICAL | Status: AC
Start: 2022-04-11 — End: 2022-03-22

## (undated) MED ORDER — SODIUM CHLORIDE 0.9 % IV SOLN
Freq: Once | INTRAVENOUS | Status: AC
Start: 2023-07-27 — End: 2023-07-27

## (undated) MED ORDER — FAMOTIDINE (PF) 20 MG/2ML IV SOLN
20.00 mg | Freq: Once | INTRAVENOUS | Status: AC | PRN
Start: 2023-01-20 — End: 2023-01-20

## (undated) MED ORDER — HYDROCORTISONE SOD SUCCINATE 100 MG IJ SOLR (CUSTOM)
100.00 mg | Freq: Once | INTRAMUSCULAR | Status: AC | PRN
Start: 2023-07-20 — End: 2023-07-20

## (undated) MED ORDER — SODIUM CHLORIDE 0.9 % IV SOLN
50.00 mg | Freq: Once | INTRAVENOUS | Status: AC
Start: 2023-05-18 — End: 2023-05-18

## (undated) MED ORDER — DIAZEPAM 5 MG OR TABS
5.00 mg | ORAL_TABLET | ORAL | 0 refills | Status: AC
Start: 2022-04-28 — End: 2022-04-28

## (undated) MED ORDER — LIDOCAINE HCL 2% EX GEL (UROJET)
10.00 mL | Freq: Once | Status: AC | PRN
Start: 2022-04-11 — End: 2022-03-22

## (undated) MED ORDER — SODIUM CHLORIDE 0.9 % IV SOLN
Freq: Once | INTRAVENOUS | Status: AC
Start: 2023-04-06 — End: 2023-03-23

## (undated) MED ORDER — LIDOCAINE HCL 2% EX GEL (UROJET)
10.00 mL | Freq: Once | Status: AC | PRN
Start: 2022-06-28 — End: 2022-06-28

## (undated) MED ORDER — SODIUM CHLORIDE 0.9 % IV SOLN
80.00 mg | Freq: Once | INTRAVENOUS | Status: AC
Start: 2023-04-13 — End: 2023-04-13

## (undated) MED ORDER — SODIUM CHLORIDE 0.9 % IV SOLN
50.0000 mg | Freq: Once | INTRAVENOUS | Status: AC
Start: 2023-06-29 — End: 2023-06-29

## (undated) MED ORDER — PEMBROLIZUMAB 100 MG/4ML IV SOLN
200.00 mg | Freq: Once | INTRAVENOUS | Status: AC
Start: 2023-01-20 — End: 2023-01-20

## (undated) MED ORDER — HYDROCORTISONE SOD SUCCINATE 100 MG IJ SOLR (CUSTOM)
100.0000 mg | Freq: Once | INTRAMUSCULAR | Status: AC | PRN
Start: 2023-05-18 — End: 2023-05-04

## (undated) MED ORDER — ONDANSETRON HCL 4 MG/2ML IV SOLN
8.0000 mg | Freq: Once | INTRAMUSCULAR | Status: AC
Start: 2023-06-29 — End: 2023-06-29

## (undated) MED ORDER — ALBUTEROL SULFATE (5 MG/ML) 0.5% IN NEBU
2.50 mg | INHALATION_SOLUTION | Freq: Once | RESPIRATORY_TRACT | Status: AC | PRN
Start: 2023-07-20 — End: 2023-07-20

## (undated) MED ORDER — FAMOTIDINE (PF) 20 MG/2ML IV SOLN
20.00 mg | Freq: Once | INTRAVENOUS | Status: AC | PRN
Start: 2023-03-03 — End: 2023-03-03

## (undated) MED ORDER — LIDOCAINE HCL 2% EX GEL (UROJET)
10.00 mL | Freq: Once | Status: AC | PRN
Start: 2022-03-01 — End: 2022-03-01

## (undated) MED ORDER — EPINEPHRINE 0.3 MG/0.3ML IJ SOAJ
0.30 mg | INTRAMUSCULAR | Status: AC | PRN
Start: 2023-01-27 — End: 2023-01-27

## (undated) MED ORDER — ALBUTEROL SULFATE (5 MG/ML) 0.5% IN NEBU
2.50 mg | INHALATION_SOLUTION | Freq: Once | RESPIRATORY_TRACT | Status: AC | PRN
Start: 2023-06-01 — End: 2023-06-01

## (undated) MED ORDER — ALBUTEROL SULFATE (5 MG/ML) 0.5% IN NEBU
2.50 mg | INHALATION_SOLUTION | Freq: Once | RESPIRATORY_TRACT | Status: AC | PRN
Start: 2023-02-10 — End: 2023-02-10

## (undated) MED ORDER — CEFAZOLIN SODIUM-DEXTROSE 2-3 GM-%(50ML) IV SOLR
2000.00 mg | Freq: Once | INTRAVENOUS | Status: AC
Start: 2022-08-18 — End: 2022-08-18

## (undated) MED ORDER — SODIUM CHLORIDE 0.9 % IV SOLN
200.0000 mg | Freq: Once | INTRAVENOUS | Status: AC
Start: 2023-05-11 — End: 2023-04-27

## (undated) MED ORDER — SODIUM CHLORIDE 0.9 % IV SOLN
Freq: Once | INTRAVENOUS | Status: AC
Start: 2023-06-01 — End: 2023-06-01

## (undated) MED ORDER — HEPARIN SODIUM (PORCINE) 5000 UNIT/ML IJ SOLN
5000.00 [IU] | Freq: Once | INTRAMUSCULAR | Status: AC
Start: 2022-05-03 — End: 2022-05-03

## (undated) MED ORDER — HYDROCORTISONE SOD SUCCINATE 100 MG IJ SOLR (CUSTOM)
100.0000 mg | Freq: Once | INTRAMUSCULAR | Status: AC | PRN
Start: 2023-06-29 — End: 2023-06-29

## (undated) MED ORDER — DIPHENHYDRAMINE HCL 50 MG/ML IJ SOLN
25.0000 mg | Freq: Once | INTRAMUSCULAR | Status: AC | PRN
Start: 2023-05-18 — End: 2023-05-04

## (undated) MED ORDER — SODIUM CHLORIDE 0.9 % IV SOLN
INTRAVENOUS | Status: AC | PRN
Start: 2023-01-27 — End: ?

## (undated) MED ORDER — SODIUM CHLORIDE 0.9 % IV SOLN
Freq: Once | INTRAVENOUS | Status: AC
Start: 2023-02-10 — End: 2023-02-10

## (undated) MED ORDER — SODIUM CHLORIDE 0.9 % IV BOLUS
1000.0000 mL | INJECTION | Freq: Once | INTRAVENOUS | Status: AC
Start: 2023-02-10 — End: 2023-02-10

## (undated) MED ORDER — SODIUM CHLORIDE 0.9 % IV SOLN
0.75 mg/kg | Freq: Once | INTRAVENOUS | Status: AC
Start: 2023-05-11 — End: 2023-05-11

## (undated) MED ORDER — PEMBROLIZUMAB 100 MG/4ML IV SOLN
200.00 mg | Freq: Once | INTRAVENOUS | Status: AC
Start: 2023-03-03 — End: 2023-03-03

## (undated) MED ORDER — SODIUM CHLORIDE (PF) 0.9 % IJ SOLN
16.70 mg | Freq: Once | INTRAMUSCULAR | Status: AC
Start: 2022-07-25 — End: 2022-07-25

## (undated) MED ORDER — FAMOTIDINE (PF) 20 MG/2ML IV SOLN
20.00 mg | Freq: Once | INTRAVENOUS | Status: AC | PRN
Start: 2023-04-13 — End: 2023-03-30

## (undated) MED ORDER — DULOXETINE HCL 20 MG OR CPEP
20.0000 mg | ORAL_CAPSULE | Freq: Every day | ORAL | Status: AC
Start: 2023-03-21 — End: ?

## (undated) MED ORDER — DIPHENHYDRAMINE HCL 50 MG/ML IJ SOLN
25.00 mg | Freq: Once | INTRAMUSCULAR | Status: AC | PRN
Start: 2023-04-06 — End: 2023-03-23

## (undated) MED ORDER — BCG LIVE 50 MG IS SUSR
50.00 mg | Freq: Once | INTRAVESICAL | Status: AC
Start: 2022-06-28 — End: 2022-06-28

## (undated) MED ORDER — DIPHENHYDRAMINE HCL 50 MG/ML IJ SOLN
25.00 mg | Freq: Once | INTRAMUSCULAR | Status: AC | PRN
Start: 2023-06-01 — End: 2023-06-01

## (undated) MED ORDER — SODIUM CHLORIDE 0.9 % IV BOLUS
500.0000 mL | INJECTION | Freq: Once | INTRAVENOUS | Status: AC | PRN
Start: 2023-07-06 — End: ?

## (undated) MED ORDER — SODIUM CHLORIDE 0.9 % IV BOLUS
1000.00 mL | INJECTION | Freq: Once | INTRAVENOUS | Status: AC
Start: 2023-05-04 — End: 2023-05-04

## (undated) MED ORDER — SODIUM CHLORIDE 0.9 % IV BOLUS
500.00 mL | INJECTION | Freq: Once | INTRAVENOUS | Status: AC | PRN
Start: 2023-03-03 — End: ?

## (undated) MED ORDER — SODIUM CHLORIDE 0.9 % IV SOLN
0.75 mg/kg | Freq: Once | INTRAVENOUS | Status: AC
Start: 2023-05-18 — End: 2023-05-18

## (undated) MED ORDER — BUPIVACAINE HCL (PF) 0.5 % IJ SOLN
1.00 mL | Freq: Once | INTRAMUSCULAR | Status: AC
Start: 2018-05-24 — End: 2018-05-24

## (undated) MED ORDER — ENFORTUMAB VEDOTIN-EJFV 30 MG IV SOLR
1.25 mg/kg | Freq: Once | INTRAVENOUS | Status: AC
Start: 2023-01-27 — End: 2023-01-27

## (undated) MED ORDER — PREGABALIN 100 MG OR CAPS
100.00 mg | ORAL_CAPSULE | Freq: Every evening | ORAL | 2 refills | Status: AC
Start: 2023-05-28 — End: ?

## (undated) MED ORDER — PREGABALIN 100 MG OR CAPS
100.0000 mg | ORAL_CAPSULE | Freq: Every evening | ORAL | 0 refills | Status: AC
Start: 2023-12-06 — End: ?

## (undated) MED ORDER — DULOXETINE HCL 20 MG OR CPEP
20.00 mg | ORAL_CAPSULE | Freq: Every day | ORAL | 3 refills | Status: AC
Start: 2022-07-27 — End: ?

## (undated) MED ORDER — LACTATED RINGERS IV SOLN
INTRAVENOUS | Status: AC
Start: 2022-08-18 — End: ?

## (undated) MED ORDER — CYCLOBENZAPRINE HCL 5 MG OR TABS
5.00 mg | ORAL_TABLET | Freq: Three times a day (TID) | ORAL | 0 refills | Status: AC | PRN
Start: 2023-01-05 — End: ?

## (undated) MED ORDER — SODIUM CHLORIDE 0.9 % IV BOLUS
1000.0000 mL | INJECTION | Freq: Once | INTRAVENOUS | Status: AC
Start: 2023-02-28 — End: 2023-02-24

## (undated) MED ORDER — PREGABALIN 100 MG OR CAPS
ORAL_CAPSULE | ORAL | 5 refills | Status: AC
Start: 2020-12-28 — End: ?

## (undated) MED ORDER — SODIUM CHLORIDE 0.9 % IV SOLN
INTRAVENOUS | Status: AC | PRN
Start: 2023-02-10 — End: ?

## (undated) MED ORDER — SODIUM CHLORIDE 0.9 % IV SOLN
Freq: Once | INTRAVENOUS | Status: AC
Start: 2023-03-03 — End: 2023-03-03

## (undated) MED ORDER — EPINEPHRINE 0.3 MG/0.3ML IJ SOAJ
0.30 mg | INTRAMUSCULAR | Status: AC | PRN
Start: 2023-03-11 — End: 2023-03-10

## (undated) MED ORDER — ALBUTEROL SULFATE (5 MG/ML) 0.5% IN NEBU
2.50 mg | INHALATION_SOLUTION | Freq: Once | RESPIRATORY_TRACT | Status: AC | PRN
Start: 2023-07-27 — End: 2023-07-27

## (undated) MED ORDER — SODIUM CHLORIDE 0.9 % IJ SOLN
50.00 mg | Freq: Once | INTRAMUSCULAR | Status: AC
Start: 2022-03-01 — End: 2022-03-01

## (undated) MED ORDER — SODIUM CHLORIDE 0.9 % IV SOLN
1.0000 mg/kg | Freq: Once | INTRAVENOUS | Status: AC
Start: 2023-05-04 — End: 2023-05-04

## (undated) MED ORDER — SODIUM CHLORIDE 0.9 % IV SOLN
INTRAVENOUS | Status: AC | PRN
Start: 2023-02-17 — End: ?

## (undated) MED ORDER — ONDANSETRON HCL 4 MG/2ML IV SOLN
8.00 mg | Freq: Once | INTRAMUSCULAR | Status: AC
Start: 2023-01-27 — End: 2023-01-27

## (undated) MED ORDER — BELLADONNA ALKALOIDS-OPIUM 16.2-60 MG RE SUPP
1.00 | Freq: Once | RECTAL | Status: AC
Start: 2021-04-29 — End: 2021-04-29

## (undated) MED ORDER — FAMOTIDINE (PF) 20 MG/2ML IV SOLN
20.00 mg | Freq: Once | INTRAVENOUS | Status: AC | PRN
Start: 2023-08-11 — End: 2023-08-11

## (undated) MED ORDER — SODIUM CHLORIDE 0.9 % IV SOLN
1.00 mg/kg | Freq: Once | INTRAVENOUS | Status: AC
Start: 2023-04-13 — End: 2023-03-30

## (undated) MED ORDER — LIDOCAINE HCL 2% EX GEL (UROJET)
10.00 mL | Freq: Once | Status: AC | PRN
Start: 2022-08-02 — End: 2022-07-19

## (undated) MED ORDER — BCG LIVE 50 MG IS SUSR
50.00 mg | Freq: Once | INTRAVESICAL | Status: AC
Start: 2022-08-09 — End: 2022-07-26

## (undated) MED ORDER — BUSPIRONE HCL 15 MG OR TABS
15.00 mg | ORAL_TABLET | Freq: Two times a day (BID) | ORAL | 1 refills | Status: AC
Start: 2023-08-24 — End: ?

## (undated) MED ORDER — BCG LIVE 50 MG IS SUSR
50.00 mg | Freq: Once | INTRAVESICAL | Status: AC
Start: 2022-04-18 — End: 2022-03-29

## (undated) MED ORDER — ACETAMINOPHEN 325 MG PO TABS
975.00 mg | ORAL_TABLET | Freq: Once | ORAL | Status: AC
Start: 2019-08-27 — End: 2019-08-27

## (undated) MED ORDER — ALBUTEROL SULFATE (5 MG/ML) 0.5% IN NEBU
2.5000 mg | INHALATION_SOLUTION | Freq: Once | RESPIRATORY_TRACT | Status: AC | PRN
Start: 2023-05-18 — End: 2023-05-04

## (undated) MED ORDER — SODIUM CHLORIDE 0.9 % IV SOLN
100.0000 mg | Freq: Once | INTRAVENOUS | Status: AC
Start: 2023-02-17 — End: 2023-02-17

## (undated) MED ORDER — PREGABALIN 75 MG OR CAPS
ORAL_CAPSULE | ORAL | 0 refills | Status: AC
Start: 2020-12-28 — End: ?

## (undated) MED ORDER — LIDOCAINE HCL 2% EX GEL (UROJET)
10.00 mL | Freq: Once | Status: AC | PRN
Start: 2022-08-15 — End: 2022-07-26

## (undated) MED ORDER — SODIUM CHLORIDE 0.9 % IV BOLUS
500.00 mL | INJECTION | Freq: Once | INTRAVENOUS | Status: AC | PRN
Start: 2023-01-27 — End: ?

## (undated) MED ORDER — ACETAMINOPHEN 325 MG PO TABS
975.00 mg | ORAL_TABLET | Freq: Once | ORAL | Status: AC
Start: 2022-05-03 — End: 2022-05-03

## (undated) MED ORDER — HYDROCORTISONE SOD SUCCINATE 100 MG IJ SOLR (CUSTOM)
100.00 mg | Freq: Once | INTRAMUSCULAR | Status: AC | PRN
Start: 2023-02-17 — End: 2023-02-17

## (undated) MED ORDER — SODIUM CHLORIDE 0.9 % IV SOLN
1.25 mg/kg | Freq: Once | INTRAVENOUS | Status: AC
Start: 2023-03-03 — End: 2023-03-03

## (undated) MED ORDER — ALBUTEROL SULFATE (5 MG/ML) 0.5% IN NEBU
2.5000 mg | INHALATION_SOLUTION | Freq: Once | RESPIRATORY_TRACT | Status: AC | PRN
Start: 2023-07-06 — End: 2023-07-06

## (undated) MED ORDER — SODIUM CHLORIDE 0.9 % IV SOLN
INTRAVENOUS | Status: AC | PRN
Start: 2023-08-11 — End: ?

## (undated) MED ORDER — EPINEPHRINE 0.3 MG/0.3ML IJ SOAJ
0.30 mg | INTRAMUSCULAR | Status: AC | PRN
Start: 2023-08-11 — End: 2023-08-11

## (undated) MED ORDER — FAMOTIDINE (PF) 20 MG/2ML IV SOLN
20.0000 mg | Freq: Once | INTRAVENOUS | Status: AC | PRN
Start: 2023-07-06 — End: 2023-07-06

## (undated) MED ORDER — SODIUM CHLORIDE 0.9 % IV SOLN
2000.0000 mg | Freq: Once | INTRAVENOUS | Status: AC
Start: 2021-05-18 — End: 2021-05-18

## (undated) MED ORDER — EPINEPHRINE 0.3 MG/0.3ML IJ SOAJ
0.30 mg | INTRAMUSCULAR | Status: AC | PRN
Start: 2023-06-01 — End: 2023-06-01

## (undated) MED ORDER — SODIUM CHLORIDE 0.9 % IJ SOLN
50.00 mg | Freq: Once | INTRAMUSCULAR | Status: AC
Start: 2022-04-25 — End: 2022-04-05

## (undated) MED ORDER — ACETAMINOPHEN 325 MG PO TABS
975.00 mg | ORAL_TABLET | Freq: Once | ORAL | Status: AC
Start: 2022-08-18 — End: 2022-08-18

## (undated) MED ORDER — ACETAMINOPHEN 325 MG PO TABS
975.00 mg | ORAL_TABLET | Freq: Once | ORAL | Status: AC
Start: 2021-04-29 — End: 2021-04-29

## (undated) MED ORDER — LIDOCAINE HCL 2% EX GEL (UROJET)
10.00 mL | Freq: Once | Status: AC | PRN
Start: 2022-03-22 — End: 2022-03-08

## (undated) MED ORDER — SODIUM CHLORIDE 0.9 % IV BOLUS
500.00 mL | INJECTION | Freq: Once | INTRAVENOUS | Status: AC | PRN
Start: 2023-06-08 — End: ?

## (undated) MED ORDER — EPINEPHRINE 0.3 MG/0.3ML IJ SOAJ
0.3000 mg | INTRAMUSCULAR | Status: AC | PRN
Start: 2023-05-18 — End: 2023-05-04

## (undated) MED ORDER — HYDROCORTISONE SOD SUCCINATE 100 MG IJ SOLR (CUSTOM)
100.00 mg | Freq: Once | INTRAMUSCULAR | Status: AC | PRN
Start: 2023-03-11 — End: 2023-03-10

## (undated) MED ORDER — DIPHENHYDRAMINE HCL 50 MG/ML IJ SOLN
25.00 mg | Freq: Once | INTRAMUSCULAR | Status: AC | PRN
Start: 2023-01-27 — End: 2023-01-27

## (undated) MED ORDER — SODIUM CHLORIDE 0.9 % IV BOLUS
500.00 mL | INJECTION | Freq: Once | INTRAVENOUS | Status: AC | PRN
Start: 2023-07-20 — End: ?

## (undated) MED ORDER — HYDROCORTISONE SOD SUCCINATE 100 MG IJ SOLR (CUSTOM)
100.00 mg | Freq: Once | INTRAMUSCULAR | Status: AC | PRN
Start: 2023-06-01 — End: 2023-06-01

## (undated) MED ORDER — SODIUM CHLORIDE 0.9 % IV BOLUS
500.00 mL | INJECTION | Freq: Once | INTRAVENOUS | Status: AC | PRN
Start: 2023-01-20 — End: ?

## (undated) MED ORDER — TIZANIDINE HCL 2 MG OR TABS
2.00 mg | ORAL_TABLET | Freq: Three times a day (TID) | ORAL | 0 refills | Status: AC | PRN
Start: 2019-09-10 — End: ?

## (undated) MED ORDER — SODIUM CHLORIDE 0.9 % IV SOLN
50.0000 mg | Freq: Once | INTRAVENOUS | Status: AC
Start: 2023-07-06 — End: 2023-07-06

## (undated) MED ORDER — PEMBROLIZUMAB 100 MG/4ML IV SOLN
200.00 mg | Freq: Once | INTRAVENOUS | Status: AC
Start: 2023-08-11 — End: 2023-08-11

## (undated) MED ORDER — SODIUM CHLORIDE 0.9 % IV BOLUS
500.00 mL | INJECTION | Freq: Once | INTRAVENOUS | Status: AC | PRN
Start: 2023-06-01 — End: ?

## (undated) MED ORDER — ENFORTUMAB VEDOTIN-EJFV 30 MG IV SOLR
50.00 mg | Freq: Once | INTRAVENOUS | Status: AC
Start: 2023-06-08 — End: 2023-06-08

## (undated) MED ORDER — SODIUM CHLORIDE 0.9 % IV SOLN
1.0000 mg/kg | Freq: Once | INTRAVENOUS | Status: AC
Start: 2023-02-17 — End: 2023-02-17

## (undated) MED ORDER — HYDROCODONE-ACETAMINOPHEN 5-325 MG OR TABS
1.00 | ORAL_TABLET | ORAL | Status: AC | PRN
Start: 2022-03-28 — End: 2022-03-28

## (undated) MED ORDER — DEXAMETHASONE SOD PHOSPHATE PF 10 MG/ML IJ SOLN
1.00 mg | Freq: Once | INTRAMUSCULAR | Status: AC
Start: 2017-10-18 — End: 2017-10-18

## (undated) MED ORDER — SODIUM CHLORIDE 0.9 % IV BOLUS
500.00 mL | INJECTION | Freq: Once | INTRAVENOUS | Status: AC | PRN
Start: 2023-02-10 — End: ?

## (undated) MED ORDER — FAMOTIDINE (PF) 20 MG/2ML IV SOLN
20.00 mg | Freq: Once | INTRAVENOUS | Status: AC | PRN
Start: 2023-03-11 — End: 2023-03-10

## (undated) MED ORDER — ONDANSETRON HCL 4 MG/2ML IV SOLN
8.00 mg | Freq: Once | INTRAMUSCULAR | Status: AC
Start: 2023-02-10 — End: 2023-02-10

## (undated) MED ORDER — HYDROXYZINE HCL 25 MG OR TABS
25.00 mg | ORAL_TABLET | Freq: Three times a day (TID) | ORAL | 0 refills | Status: AC | PRN
Start: 2023-05-17 — End: ?

## (undated) MED ORDER — LIDOCAINE HCL 2% EX GEL (UROJET)
10.00 mL | Freq: Once | Status: AC | PRN
Start: 2022-07-19 — End: 2022-07-05

## (undated) MED ORDER — ONDANSETRON HCL 4 MG/2ML IV SOLN
8.00 mg | Freq: Once | INTRAMUSCULAR | Status: AC
Start: 2023-03-11 — End: 2023-03-10

## (undated) MED ORDER — ONDANSETRON HCL 4 MG/2ML IV SOLN
8.0000 mg | Freq: Once | INTRAMUSCULAR | Status: AC
Start: 2023-05-18 — End: 2023-05-04

## (undated) MED ORDER — SODIUM CHLORIDE 0.9 % IV SOLN
INTRAVENOUS | Status: AC | PRN
Start: 2023-06-08 — End: ?

## (undated) MED ORDER — ONDANSETRON HCL 4 MG/2ML IV SOLN
8.00 mg | Freq: Once | INTRAMUSCULAR | Status: AC
Start: 2023-04-06 — End: 2023-03-23

## (undated) MED ORDER — LIDOCAINE HCL (PF) 1 % IJ SOLN
0.10 mL | INTRAMUSCULAR | Status: AC | PRN
Start: 2019-08-27 — End: ?

## (undated) MED ORDER — EPINEPHRINE 0.3 MG/0.3ML IJ SOAJ
0.30 mg | INTRAMUSCULAR | Status: AC | PRN
Start: 2023-06-08 — End: 2023-06-08

## (undated) MED ORDER — SODIUM CHLORIDE 0.9 % IV SOLN
Freq: Once | INTRAVENOUS | Status: AC
Start: 2023-02-17 — End: 2023-02-17

## (undated) MED ORDER — PROCHLORPERAZINE MALEATE 10 MG OR TABS
10.00 mg | ORAL_TABLET | Freq: Four times a day (QID) | ORAL | 5 refills | Status: AC | PRN
Start: 2023-02-25 — End: ?

## (undated) MED ORDER — SODIUM CHLORIDE 0.9 % IV SOLN
Freq: Once | INTRAVENOUS | Status: AC
Start: 2023-03-11 — End: 2023-03-10

## (undated) MED ORDER — HYDROCORTISONE SOD SUCCINATE 100 MG IJ SOLR (CUSTOM)
100.00 mg | Freq: Once | INTRAMUSCULAR | Status: AC | PRN
Start: 2023-04-06 — End: 2023-03-23

## (undated) MED ORDER — SODIUM CHLORIDE 0.9 % IV SOLN
Freq: Once | INTRAVENOUS | Status: AC
Start: 2023-05-11 — End: 2023-04-27

## (undated) MED ORDER — SODIUM CHLORIDE 0.9 % IV SOLN
INTRAVENOUS | Status: AC | PRN
Start: 2023-06-29 — End: ?

## (undated) MED ORDER — FAMOTIDINE (PF) 20 MG/2ML IV SOLN
20.00 mg | Freq: Once | INTRAVENOUS | Status: AC | PRN
Start: 2023-02-10 — End: 2023-02-10

## (undated) MED ORDER — HYDROCORTISONE SOD SUCCINATE 100 MG IJ SOLR (CUSTOM)
100.0000 mg | Freq: Once | INTRAMUSCULAR | Status: AC | PRN
Start: 2023-05-11 — End: 2023-04-27

## (undated) MED ORDER — BELLADONNA ALKALOIDS-OPIUM 16.2-60 MG RE SUPP
1.0000 | Freq: Once | RECTAL | Status: AC
Start: 2021-06-25 — End: 2021-06-25

## (undated) MED ORDER — ALBUTEROL SULFATE (5 MG/ML) 0.5% IN NEBU
2.50 mg | INHALATION_SOLUTION | Freq: Once | RESPIRATORY_TRACT | Status: AC | PRN
Start: 2023-01-27 — End: 2023-01-27

## (undated) MED ORDER — LIDOCAINE HCL 2% EX GEL (UROJET)
10.00 mL | Freq: Once | Status: AC | PRN
Start: 2022-07-26 — End: 2022-07-12

## (undated) MED ORDER — LIDOCAINE HCL 2 % IJ SOLN WRAPPED RECORD
10.00 mL | Freq: Once | INTRAMUSCULAR | Status: AC
Start: 2018-06-26 — End: 2018-06-26

## (undated) MED ORDER — HYDROCORTISONE SOD SUCCINATE 100 MG IJ SOLR (CUSTOM)
100.0000 mg | Freq: Once | INTRAMUSCULAR | Status: AC | PRN
Start: 2023-07-06 — End: 2023-07-06

## (undated) MED ORDER — SODIUM CHLORIDE 0.9 % IV BOLUS
500.00 mL | INJECTION | Freq: Once | INTRAVENOUS | Status: AC | PRN
Start: 2023-04-06 — End: ?

## (undated) MED ORDER — SODIUM CHLORIDE 0.9 % IV SOLN
INTRAVENOUS | Status: AC | PRN
Start: 2023-05-11 — End: ?

## (undated) MED ORDER — ENFORTUMAB VEDOTIN-EJFV 30 MG IV SOLR
1.25 mg/kg | Freq: Once | INTRAVENOUS | Status: AC
Start: 2023-01-20 — End: 2023-01-20

## (undated) MED ORDER — CIPROFLOXACIN HCL 500 MG OR TABS
500.00 mg | ORAL_TABLET | Freq: Once | ORAL | Status: AC
Start: 2023-03-06 — End: 2023-03-06

## (undated) MED ORDER — DOCUSATE SODIUM 250 MG OR CAPS
250.00 mg | ORAL_CAPSULE | Freq: Every evening | ORAL | 0 refills | Status: AC
Start: 2019-09-10 — End: ?

## (undated) MED ORDER — DIPHENHYDRAMINE HCL 50 MG/ML IJ SOLN
25.0000 mg | Freq: Once | INTRAMUSCULAR | Status: AC | PRN
Start: 2023-05-11 — End: 2023-04-27

## (undated) MED ORDER — SODIUM CHLORIDE 0.9 % IV SOLN
INTRAVENOUS | Status: AC | PRN
Start: 2023-04-06 — End: ?

## (undated) MED ORDER — DIPHENHYDRAMINE HCL 50 MG/ML IJ SOLN
25.00 mg | Freq: Once | INTRAMUSCULAR | Status: AC | PRN
Start: 2023-04-13 — End: 2023-03-30

## (undated) MED ORDER — SODIUM CHLORIDE 0.9 % IV SOLN
1.0000 mg/kg | Freq: Once | INTRAVENOUS | Status: AC
Start: 2023-04-27 — End: 2023-04-27

## (undated) MED ORDER — HYDROCORTISONE SOD SUCCINATE 100 MG IJ SOLR (CUSTOM)
100.00 mg | Freq: Once | INTRAMUSCULAR | Status: AC | PRN
Start: 2023-02-10 — End: 2023-02-10

## (undated) MED ORDER — SODIUM CHLORIDE 0.9 % IV SOLN
1.0000 mg/kg | Freq: Once | INTRAVENOUS | Status: AC
Start: 2023-02-10 — End: 2023-02-10

## (undated) MED ORDER — ALBUTEROL SULFATE (5 MG/ML) 0.5% IN NEBU
2.50 mg | INHALATION_SOLUTION | Freq: Once | RESPIRATORY_TRACT | Status: AC | PRN
Start: 2023-03-03 — End: 2023-03-03

## (undated) MED ORDER — DIPHENHYDRAMINE HCL 50 MG/ML IJ SOLN
25.00 mg | Freq: Once | INTRAMUSCULAR | Status: AC | PRN
Start: 2023-02-10 — End: 2023-02-10

## (undated) MED ORDER — SODIUM CHLORIDE 0.9 % IV BOLUS
500.0000 mL | INJECTION | Freq: Once | INTRAVENOUS | Status: AC | PRN
Start: 2023-05-18 — End: ?

## (undated) MED ORDER — BCG LIVE 50 MG IS SUSR
50.00 mg | Freq: Once | INTRAVESICAL | Status: AC
Start: 2022-07-19 — End: 2022-07-05

## (undated) MED ORDER — LIDOCAINE HCL 2% EX GEL (UROJET)
10.00 mL | Freq: Once | Status: AC | PRN
Start: 2022-08-23 — End: 2022-08-02

## (undated) MED ORDER — SODIUM CHLORIDE 0.9 % IV SOLN
1.00 mg/kg | Freq: Once | INTRAVENOUS | Status: AC
Start: 2023-03-11 — End: 2023-03-10

## (undated) MED ORDER — FAMOTIDINE (PF) 20 MG/2ML IV SOLN
20.00 mg | Freq: Once | INTRAVENOUS | Status: AC | PRN
Start: 2023-06-01 — End: 2023-06-01

## (undated) MED ORDER — DIPHENHYDRAMINE HCL 50 MG/ML IJ SOLN
25.0000 mg | Freq: Once | INTRAMUSCULAR | Status: AC | PRN
Start: 2023-06-29 — End: 2023-06-29

## (undated) MED ORDER — LIDOCAINE HCL 2% EX GEL (UROJET)
10.00 mL | Freq: Once | Status: AC | PRN
Start: 2022-04-04 — End: 2022-03-15

## (undated) MED ORDER — FAMOTIDINE (PF) 20 MG/2ML IV SOLN
20.0000 mg | Freq: Once | INTRAVENOUS | Status: AC | PRN
Start: 2023-05-11 — End: 2023-04-27

## (undated) MED ORDER — HYDROCORTISONE SOD SUCCINATE 100 MG IJ SOLR (CUSTOM)
100.00 mg | Freq: Once | INTRAMUSCULAR | Status: AC | PRN
Start: 2023-06-08 — End: 2023-06-08

## (undated) MED ORDER — SODIUM CHLORIDE 0.9 % IV SOLN
1.25 mg/kg | Freq: Once | INTRAVENOUS | Status: AC
Start: 2023-02-10 — End: 2023-02-10

---

## 1993-12-19 HISTORY — PX: NEPHRECTOMY: SHX65

## 2006-06-13 ENCOUNTER — Ambulatory Visit: Payer: Self-pay | Admitting: Family Medicine

## 2006-10-26 ENCOUNTER — Ambulatory Visit: Payer: Self-pay | Admitting: Family Medicine

## 2006-10-26 LAB — CONVERTED CEMR LAB: PSA: 1.56 ng/mL

## 2006-10-30 ENCOUNTER — Ambulatory Visit: Payer: Self-pay | Admitting: Family Medicine

## 2006-11-16 ENCOUNTER — Ambulatory Visit: Payer: Self-pay | Admitting: Internal Medicine

## 2006-11-30 ENCOUNTER — Ambulatory Visit: Payer: Self-pay | Admitting: Internal Medicine

## 2006-11-30 HISTORY — PX: COLONOSCOPY: SHX174

## 2006-11-30 LAB — HM COLONOSCOPY

## 2007-01-31 ENCOUNTER — Ambulatory Visit: Payer: Self-pay | Admitting: Family Medicine

## 2007-01-31 LAB — CONVERTED CEMR LAB
ALT: 30 units/L (ref 0–40)
AST: 22 units/L (ref 0–37)
Cholesterol: 169 mg/dL (ref 0–200)
HDL: 40 mg/dL (ref >39.0)
LDL Cholesterol: 112 mg/dL — ABNORMAL HIGH (ref 0–99)
Total CHOL/HDL Ratio: 4.2
Triglycerides: 85 mg/dL (ref 0–149)
VLDL: 17 mg/dL (ref 0–40)

## 2007-02-06 ENCOUNTER — Ambulatory Visit: Payer: Self-pay | Admitting: Family Medicine

## 2007-04-16 ENCOUNTER — Telehealth: Payer: Self-pay | Admitting: Family Medicine

## 2007-08-10 ENCOUNTER — Telehealth: Payer: Self-pay | Admitting: Family Medicine

## 2007-08-21 ENCOUNTER — Ambulatory Visit: Payer: Self-pay | Admitting: Family Medicine

## 2007-11-07 ENCOUNTER — Ambulatory Visit: Payer: Self-pay | Admitting: Family Medicine

## 2007-11-07 DIAGNOSIS — Z87891 Personal history of nicotine dependence: Secondary | ICD-10-CM | POA: Insufficient documentation

## 2007-11-07 DIAGNOSIS — K5730 Diverticulosis of large intestine without perforation or abscess without bleeding: Secondary | ICD-10-CM | POA: Insufficient documentation

## 2007-11-07 DIAGNOSIS — R7309 Other abnormal glucose: Secondary | ICD-10-CM | POA: Insufficient documentation

## 2007-11-07 DIAGNOSIS — Z8711 Personal history of peptic ulcer disease: Secondary | ICD-10-CM | POA: Insufficient documentation

## 2007-11-07 LAB — CONVERTED CEMR LAB
ALT: 28 units/L (ref 0–53)
AST: 22 units/L (ref 0–37)
Albumin: 3.6 g/dL (ref 3.5–5.2)
Alkaline Phosphatase: 58 units/L (ref 39–117)
BUN: 10 mg/dL (ref 6–23)
Basophils Absolute: 0 10*3/uL (ref 0.0–0.1)
Basophils Relative: 0.2 % (ref 0.0–1.0)
Bilirubin, Direct: 0.1 mg/dL (ref 0.0–0.3)
CO2: 30 meq/L (ref 19–32)
Calcium: 9.2 mg/dL (ref 8.4–10.5)
Chloride: 104 meq/L (ref 96–112)
Cholesterol: 156 mg/dL (ref 0–200)
Creatinine, Ser: 1.1 mg/dL (ref 0.4–1.5)
Eosinophils Absolute: 0.3 10*3/uL (ref 0.0–0.6)
Eosinophils Relative: 3.5 % (ref 0.0–5.0)
GFR calc Af Amer: 90 mL/min
GFR calc non Af Amer: 74 mL/min
Glucose, Bld: 95 mg/dL (ref 70–99)
HCT: 47.7 % (ref 39.0–52.0)
HDL: 50.4 mg/dL (ref >39.0)
Hemoglobin: 16.4 g/dL (ref 13.0–17.0)
LDL Cholesterol: 88 mg/dL (ref 0–99)
Lymphocytes Relative: 25.8 % (ref 12.0–46.0)
MCHC: 34.4 g/dL (ref 30.0–36.0)
MCV: 88.8 fL (ref 78.0–100.0)
Monocytes Absolute: 0.9 10*3/uL — ABNORMAL HIGH (ref 0.2–0.7)
Monocytes Relative: 9.6 % (ref 3.0–11.0)
Neutro Abs: 5.5 10*3/uL (ref 1.4–7.7)
Neutrophils Relative %: 60.9 % (ref 43.0–77.0)
PSA: 1.34 ng/mL (ref 0.10–4.00)
Platelets: 182 10*3/uL (ref 150–400)
Potassium: 4.2 meq/L (ref 3.5–5.1)
RBC: 5.37 M/uL (ref 4.22–5.81)
RDW: 12.5 % (ref 11.5–14.6)
Sodium: 138 meq/L (ref 135–145)
TSH: 1.66 microintl units/mL (ref 0.35–5.50)
Total Bilirubin: 0.8 mg/dL (ref 0.3–1.2)
Total CHOL/HDL Ratio: 3.1
Total Protein: 6.8 g/dL (ref 6.0–8.3)
Triglycerides: 90 mg/dL (ref 0–149)
VLDL: 18 mg/dL (ref 0–40)
WBC: 9 10*3/uL (ref 4.5–10.5)

## 2007-11-13 ENCOUNTER — Ambulatory Visit: Payer: Self-pay | Admitting: Family Medicine

## 2008-10-23 ENCOUNTER — Ambulatory Visit: Payer: Self-pay | Admitting: Family Medicine

## 2008-10-23 ENCOUNTER — Ambulatory Visit: Payer: Self-pay | Admitting: Cardiology

## 2008-10-23 LAB — CONVERTED CEMR LAB
ALT: 22 units/L (ref 0–53)
AST: 15 units/L (ref 0–37)
Albumin: 3.7 g/dL (ref 3.5–5.2)
Alkaline Phosphatase: 81 units/L (ref 39–117)
Amylase: 61 units/L (ref 27–131)
BUN: 14 mg/dL (ref 6–23)
Basophils Absolute: 0 10*3/uL (ref 0.0–0.1)
Basophils Relative: 0.3 % (ref 0.0–3.0)
Bilirubin, Direct: 0.1 mg/dL (ref 0.0–0.3)
CO2: 32 meq/L (ref 19–32)
Calcium: 9.3 mg/dL (ref 8.4–10.5)
Chloride: 100 meq/L (ref 96–112)
Creatinine, Ser: 1.1 mg/dL (ref 0.4–1.5)
Eosinophils Absolute: 0.3 10*3/uL (ref 0.0–0.7)
Eosinophils Relative: 2.1 % (ref 0.0–5.0)
GFR calc Af Amer: 90 mL/min
GFR calc non Af Amer: 74 mL/min
Glucose, Bld: 85 mg/dL (ref 70–99)
HCT: 47.7 % (ref 39.0–52.0)
Hemoglobin: 16.8 g/dL (ref 13.0–17.0)
Lipase: 20 units/L (ref 11.0–59.0)
Lymphocytes Relative: 20.5 % (ref 12.0–46.0)
MCHC: 35.3 g/dL (ref 30.0–36.0)
MCV: 89.4 fL (ref 78.0–100.0)
Monocytes Absolute: 1.3 10*3/uL — ABNORMAL HIGH (ref 0.1–1.0)
Monocytes Relative: 10.5 % (ref 3.0–12.0)
Neutro Abs: 7.9 10*3/uL — ABNORMAL HIGH (ref 1.4–7.7)
Neutrophils Relative %: 66.6 % (ref 43.0–77.0)
Platelets: 169 10*3/uL (ref 150–400)
Potassium: 4 meq/L (ref 3.5–5.1)
RBC: 5.33 M/uL (ref 4.22–5.81)
RDW: 12.2 % (ref 11.5–14.6)
Sodium: 139 meq/L (ref 135–145)
Total Bilirubin: 0.9 mg/dL (ref 0.3–1.2)
Total Protein: 7.6 g/dL (ref 6.0–8.3)
WBC: 12 10*3/uL — ABNORMAL HIGH (ref 4.5–10.5)

## 2008-10-24 ENCOUNTER — Telehealth (INDEPENDENT_AMBULATORY_CARE_PROVIDER_SITE_OTHER): Payer: Self-pay | Admitting: *Deleted

## 2008-10-24 ENCOUNTER — Encounter: Payer: Self-pay | Admitting: Family Medicine

## 2008-10-25 ENCOUNTER — Ambulatory Visit: Payer: Self-pay | Admitting: Family Medicine

## 2008-10-27 ENCOUNTER — Telehealth: Payer: Self-pay | Admitting: Family Medicine

## 2008-10-28 ENCOUNTER — Ambulatory Visit: Payer: Self-pay | Admitting: Family Medicine

## 2008-10-29 LAB — CONVERTED CEMR LAB: H Pylori IgG: NEGATIVE

## 2009-03-11 ENCOUNTER — Ambulatory Visit: Payer: Self-pay | Admitting: Family Medicine

## 2009-03-16 LAB — CONVERTED CEMR LAB
ALT: 18 units/L (ref 0–53)
AST: 15 units/L (ref 0–37)
Albumin: 3.6 g/dL (ref 3.5–5.2)
Alkaline Phosphatase: 58 units/L (ref 39–117)
BUN: 13 mg/dL (ref 6–23)
Basophils Absolute: 0 10*3/uL (ref 0.0–0.1)
Basophils Relative: 0.2 % (ref 0.0–3.0)
Bilirubin, Direct: 0.1 mg/dL (ref 0.0–0.3)
CO2: 30 meq/L (ref 19–32)
Calcium: 8.8 mg/dL (ref 8.4–10.5)
Chloride: 106 meq/L (ref 96–112)
Cholesterol: 166 mg/dL (ref 0–200)
Creatinine, Ser: 1.3 mg/dL (ref 0.4–1.5)
Creatinine,U: 331.3 mg/dL
Eosinophils Absolute: 0.3 10*3/uL (ref 0.0–0.7)
Eosinophils Relative: 3.6 % (ref 0.0–5.0)
GFR calc non Af Amer: 60.85 mL/min (ref >60)
Glucose, Bld: 88 mg/dL (ref 70–99)
HCT: 47.7 % (ref 39.0–52.0)
HDL: 47.8 mg/dL (ref >39.00)
Hemoglobin: 16.6 g/dL (ref 13.0–17.0)
LDL Cholesterol: 95 mg/dL (ref 0–99)
Lymphocytes Relative: 28.9 % (ref 12.0–46.0)
Lymphs Abs: 2.6 10*3/uL (ref 0.7–4.0)
MCHC: 34.8 g/dL (ref 30.0–36.0)
MCV: 89.6 fL (ref 78.0–100.0)
Microalb Creat Ratio: 2.7 mg/g (ref 0.0–30.0)
Microalb, Ur: 0.9 mg/dL (ref 0.0–1.9)
Monocytes Absolute: 0.9 10*3/uL (ref 0.1–1.0)
Monocytes Relative: 10.3 % (ref 3.0–12.0)
Neutro Abs: 5.3 10*3/uL (ref 1.4–7.7)
Neutrophils Relative %: 57 % (ref 43.0–77.0)
PSA: 1.56 ng/mL (ref 0.10–4.00)
Platelets: 169 10*3/uL (ref 150.0–400.0)
Potassium: 3.8 meq/L (ref 3.5–5.1)
RBC: 5.32 M/uL (ref 4.22–5.81)
RDW: 12.9 % (ref 11.5–14.6)
Sodium: 141 meq/L (ref 135–145)
TSH: 1.19 microintl units/mL (ref 0.35–5.50)
Total Bilirubin: 1 mg/dL (ref 0.3–1.2)
Total CHOL/HDL Ratio: 3
Total Protein: 6.8 g/dL (ref 6.0–8.3)
Triglycerides: 117 mg/dL (ref 0.0–149.0)
VLDL: 23.4 mg/dL (ref 0.0–40.0)
WBC: 9.1 10*3/uL (ref 4.5–10.5)

## 2009-03-17 ENCOUNTER — Ambulatory Visit: Payer: Self-pay | Admitting: Family Medicine

## 2009-12-28 ENCOUNTER — Telehealth: Payer: Self-pay | Admitting: Family Medicine

## 2010-07-20 ENCOUNTER — Telehealth (INDEPENDENT_AMBULATORY_CARE_PROVIDER_SITE_OTHER): Payer: Self-pay | Admitting: *Deleted

## 2010-07-21 ENCOUNTER — Ambulatory Visit: Payer: Self-pay | Admitting: Family Medicine

## 2010-07-21 LAB — CONVERTED CEMR LAB
ALT: 17 units/L (ref 0–53)
AST: 17 units/L (ref 0–37)
Albumin: 3.8 g/dL (ref 3.5–5.2)
Alkaline Phosphatase: 59 units/L (ref 39–117)
BUN: 16 mg/dL (ref 6–23)
Bilirubin, Direct: 0.1 mg/dL (ref 0.0–0.3)
CO2: 29 meq/L (ref 19–32)
Calcium: 9.2 mg/dL (ref 8.4–10.5)
Chloride: 102 meq/L (ref 96–112)
Creatinine, Ser: 1 mg/dL (ref 0.4–1.5)
GFR calc non Af Amer: 79.21 mL/min (ref >60)
Glucose, Bld: 87 mg/dL (ref 70–99)
PSA: 2.72 ng/mL (ref 0.10–4.00)
Potassium: 4.3 meq/L (ref 3.5–5.1)
Sodium: 141 meq/L (ref 135–145)
Total Bilirubin: 0.7 mg/dL (ref 0.3–1.2)
Total Protein: 6.8 g/dL (ref 6.0–8.3)

## 2010-07-26 ENCOUNTER — Ambulatory Visit: Payer: Self-pay | Admitting: Internal Medicine

## 2010-07-26 DIAGNOSIS — T753XXA Motion sickness, initial encounter: Secondary | ICD-10-CM | POA: Insufficient documentation

## 2010-11-22 ENCOUNTER — Ambulatory Visit: Payer: Self-pay | Admitting: Psychology

## 2010-12-08 ENCOUNTER — Ambulatory Visit: Payer: Self-pay | Admitting: Psychology

## 2010-12-22 ENCOUNTER — Encounter (INDEPENDENT_AMBULATORY_CARE_PROVIDER_SITE_OTHER): Payer: Self-pay | Admitting: *Deleted

## 2010-12-22 ENCOUNTER — Ambulatory Visit: Admit: 2010-12-22 | Payer: Self-pay | Admitting: Family Medicine

## 2011-01-18 NOTE — Progress Notes (Signed)
----   Converted from flag ---- ---- 07/20/2010 9:07 AM, Eustaquio Boyden  MD wrote: Can we draw CMP and PSA?  Dx 272.0 and V76.44   ---- 07/20/2010 7:55 AM, Liane Comber CMA (AAMA) wrote: Peri [facility] Morning! Pt is scheduled for cpx labs tomorrow, what labs to draw and dx codes to use? Thanks Tasha ------------------------------

## 2011-01-18 NOTE — Progress Notes (Signed)
Summary: Wants to know about [facility]  Phone Note Call from Patient Call back at Home Phone [phone]   Caller: Spouse Call For: Shaune Leeks MD/ Dr. Milinda Antis Summary of Call: Wife called and said that she got an email about [facility] Screening and it says that you can get 5 screenings for 150.00 and she want to know if it would be a good idea to do it or if it is a scam. I told her I didn't know ayhting about [facility]. Please advise.  Initial call taken by: Melody Comas,  December 28, 2009 2:37 PM  Follow-up for Phone Call        I will foward this to Dr Hetty Ely to see what he thinks  these are tests that are not normally recommended for random screening  let him know Dr Kathie Rhodes  is out for the week   Follow-up by: Judith Part MD,  December 28, 2009 3:17 PM  Additional Follow-up for Phone Call Additional follow up Details #1::        Advised pt's wife, she has decided against the pt having these tests done. Additional Follow-up by: Lowella Petties CMA,  December 28, 2009 3:27 PM

## 2011-01-18 NOTE — Assessment & Plan Note (Signed)
Summary: CPX AND ESTABH FROM [facility] NEEDS PATCH FOR SEA SICKNESS/DLO   Vital Signs:  Patient profile:   60 year old male Height:      68 inches Weight:      211.25 pounds BMI:     32.24 Temp:     98.4 degrees F oral Pulse rate:   76 / minute Pulse rhythm:   regular BP sitting:   118 / 80  (left arm) Cuff size:   regular  Vitals Entered By: Selena Batten Dance CMA Duncan Dull) (July 26, 2010 8:41 AM) CC: CPx/Needs motion sickness patch   History of Present Illness: CC: CPX  1. trip coming up for motion sickness.  Has tried dramamine.  Would like to try patch.    2. smoking - 1 ppd, started at age 27.  seriously thinking about quitting.  when ready will return.  h/o duodenal ulcer 26 yrs ago, no bleeding or issues since.  attack of diverticulitis last year.  Preventive Screening-Counseling & Management  Alcohol-Tobacco     Alcohol drinks/day: <1     Smoking Status: current     Smoking Cessation Counseling: yes     Packs/Day: 1.0     Year Started: 1970  Caffeine-Diet-Exercise     Caffeine use/day: 5     Caffeine Counseling: decrease use of caffeine  Safety-Violence-Falls     Seat Belt Use: yes      Drug Use:  never.    Colonoscopy  Procedure date:  11/30/2006  Findings:       Results: Polyp.  Results: Diverticulosis.         Current Medications (verified): 1)  Pravastatin Sodium 40 Mg Tabs (Pravastatin Sodium) .... 2 At Bedtime  By Mouth 2)  Transderm-Scop 1.5 Mg Pt72 (Scopolamine Base) .... Apply One Behind Ear At Least 4 Hours Prior To Exposure, May Change Q72 Hrs  Allergies: 1)  ! Erythromycin  Past History:  Past medical, surgical, family and social histories (including risk factors) reviewed for relevance to current acute and chronic problems.  Past Medical History: Diverticulosis, colon, with diverticulitis x1 duodenal ulcer HLD smoker  Past Surgical History: Reviewed history from 11/07/2007 and no changes required. Vasectomy x 2  Hemm I&D    Colonoscopy, divertics, 4 polyps 11/30/06  Family History: Reviewed history from 03/17/2009 and no changes required. Father: Died at age 75 of cirrhosis of the liver, with alcoholism, congestive heart failure, and emphysema from smoking; he had 4 heart attacks, the last one a few days prior to his death Mother: Alive 54  smoking COPD   Macular Degeneration, Catarracts bilat removed Siblings:4 sisters, one with depression and resolved alcohol abuse, epilepsy CV + Father deceased, MI x 4 HBP - none DM + PGM Prostate cancer - none Breast/Ovarian/Uterine cancer - maternal aunt with BRCA Depression + sister, ETOH ETOH/Drug abuse + father, sister Stroke - none Alzheimer's + aunt  No other CA     Social History: Reviewed history from 03/17/2009 and no changes required. 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         + tinnitus bilateral ears  Physical Exam  General:  Well-developed,well-nourished,in no acute distress; alert,appropriate and cooperative throughout examination Head:  Normocephalic and atraumatic without obvious abnormalities. No apparent alopecia or balding. Eyes:  No corneal or conjunctival inflammation noted. EOMI. Perrla.  Ears:  no external deformities.   Mouth:  Oral mucosa and oropharynx without lesions or exudates.  Teeth  in good repair. Neck:  No deformities, masses, or tenderness noted.  no thyromegaly noted Lungs:  Normal respiratory effort, chest expands symmetrically. Lungs are clear to auscultation, no crackles or wheezes. Heart:  Normal rate and regular rhythm. S1 and S2 normal without gallop, murmur, click, rub or other extra sounds. Abdomen:  Bowel sounds positive,abdomen soft and non-tender without masses, organomegaly or hernias noted. Rectal:  No external abnormalities noted. Normal sphincter tone. No rectal masses or tenderness.  mild-mod ext hemms, deflated. Prostate:  Prostate gland firm and smooth, no enlargement, nodularity, tenderness, mass, asymmetry or induration. 20-30gms. Msk:  full ROM, normal gait.  Pulses:  2+ periph pulses Extremities:  No clubbing, cyanosis, edema, or deformity noted with normal full range of motion of all joints.   Skin:  Intact without suspicious lesions or rashes   Impression & Recommendations:  Problem # 1:  HEALTH MAINTENANCE EXAM (ICD-V70.0)  Reviewed preventive care protocols, scheduled due services, and updated immunizations.  PSA vel 0.69, still low risk.  Rpt 1 year.  Colonoscopy ok 2007.  Problem # 2:  PURE HYPERCHOLESTEROLEMIA, 233/LDL 163 (ICD-272.0) advised restart pravastatin (off x months).  Return for FLP in 4 months to assess response. His updated medication list for this problem includes:    Pravastatin Sodium 40 Mg Tabs (Pravastatin sodium) .Marland Kitchen... 2 at bedtime  by mouth  Labs Reviewed: SGOT: 17 (07/21/2010)   SGPT: 17 (07/21/2010)   HDL:47.80 (03/11/2009), 50.4 (11/07/2007)  LDL:95 (03/11/2009), 88 (11/07/2007)  Chol:166 (03/11/2009), 156 (11/07/2007)  Trig:117.0 (03/11/2009), 90 (11/07/2007)  Problem # 3:  SPECIAL SCREENING MALIGNANT NEOPLASM OF PROSTATE (ICD-V76.44) PSA/DRE reassuring.  Problem # 4:  MOTION SICKNESS (ICD-994.6) scopalamine patch sent in today.  discussed possible side effects.  Problem # 5:  PEPTIC ULCER DISEASE, HX OF  (ICD-V12.71) aspirin use a few tiems a week recommended for CAD prevention.  Framingham risk 15%. Labs Reviewed: Hgb: 16.6 (03/11/2009)   Hct: 47.7 (03/11/2009)     Problem # 6:  NONDEPENDENT TOBACCO USE DISORDER (ICD-305.1)  Encouraged smoking cessation and discussed different methods for smoking cessation.   quitline number provided as well as website.  pt contemplative.  rtc when ready to quit.  Complete Medication List: 1)  Pravastatin Sodium 40 Mg Tabs (Pravastatin sodium) .... 2 at bedtime  by mouth 2)  Transderm-scop 1.5 Mg Pt72 (Scopolamine base) .... Apply one behind ear at least 4 hours prior to exposure, may change q72 hrs 3)  Baby Aspirin 81 Mg Chew (Aspirin) .... One a few times a week  Patient Instructions: 1)  Please restart pravastatin 2 daily. 2)  Prescription for scopalamine patch sent to your pharmacy. 3)  I'd recommend taking a baby aspirin a few times a week. 4)  Please return in 4-6 months for fasting cholesterol check [FLP 272.0].  Return in 1 year for next physical. 5)  Keep cutting back on smoking.  [place] Quitline: 1-800-QUIT-NOW for assistance with smoking cessation.  Quitline[place].com  Come see [name] if you want help. 6)  Pleasure to meet  you today.   Prescriptions: PRAVASTATIN SODIUM 40 MG TABS (PRAVASTATIN SODIUM) 2 at bedtime  by mouth  #180 Each x 3   Entered and Authorized by:   Eustaquio Boyden  MD   Signed by:   Eustaquio Boyden  MD on 07/26/2010   Method used:   Electronically to        [facility] [address] (retail)       [address], [address]       [place], [place]  [address]       Ph: [phone]       Fax: [phone]   RxID:   [id number] TRANSDERM-SCOP 1.5 MG PT72 (SCOPOLAMINE BASE) apply one behind ear at least 4 hours prior to exposure, may change Q72 hrs  #41 x 1   Entered and Authorized by:   Eustaquio Boyden  MD   Signed by:   Eustaquio Boyden  MD on 07/26/2010   Method used:   Electronically to         [facility] [address] (retail)       [address], [address]       [place], [place]  [address]       Ph: [phone]       Fax: [phone]   RxID:   [id number]   Current Allergies (reviewed today): ! ERYTHROMYCIN   Prevention & Chronic Care Immunizations   Influenza vaccine: Not documented   Influenza vaccine due: 08/19/2010    Tetanus booster: 10/20/2006: Td   Tetanus booster due: 10/20/2016    Pneumococcal vaccine: Not documented  Colorectal Screening   Hemoccult: Not documented   Hemoccult action/deferral: Not indicated  (07/26/2010)    Colonoscopy:  Results: Polyp.  Results: Diverticulosis.         (11/30/2006)   Colonoscopy due: 11/30/2016  Other Screening   PSA: 2.72  (07/21/2010)   PSA due due: 07/22/2011   Smoking status: current  (07/26/2010)   Smoking cessation counseling: yes  (07/26/2010)  Lipids   Total Cholesterol: 166  (03/11/2009)   LDL: 95  (03/11/2009)   LDL Direct: Not documented   HDL: 47.80  (03/11/2009)   Triglycerides: 117.0  (03/11/2009)    SGOT (AST): 17  (07/21/2010)   SGPT (ALT): 17  (07/21/2010)   Alkaline phosphatase: 59  (07/21/2010)   Total bilirubin: 0.7  (07/21/2010)    Lipid flowsheet reviewed?: Yes   Progress toward LDL goal: At goal  Self-Management Support :   Personal Goals (by the next clinic visit) :      Personal LDL goal: 130  (07/26/2010)    Lipid self-management support: Not documented

## 2011-01-20 NOTE — Letter (Signed)
Summary: Gerald No Show Letter  [facility]  [address] [place], [place] [address]   Phone: [phone]  Fax: [phone]    12/22/2010 MRN: [id number]  Avram LEGER [address] [place], [place]  [address]   Dear Mr. DEMELLO,   Our records indicate that you missed your scheduled appointment with __Laboratory___________________ on __1.4.2012__________.  Please contact this office to reschedule your appointment as soon as possible.  It is important that you keep your scheduled appointments with your physician, so we can provide you the best care possible.  Please be advised that there may be a charge for "no show" appointments.    Sincerely,    at [facility]

## 2011-08-16 ENCOUNTER — Other Ambulatory Visit: Payer: Self-pay | Admitting: *Deleted

## 2011-08-16 MED ORDER — PRAVASTATIN SODIUM 40 MG PO TABS: 80.0000 mg | ORAL_TABLET | Freq: Every day | ORAL | Status: DC

## 2011-08-16 NOTE — Telephone Encounter (Signed)
Re-sent pravastatin electronically.

## 2011-10-05 ENCOUNTER — Other Ambulatory Visit: Payer: Self-pay | Admitting: Family Medicine

## 2011-10-05 DIAGNOSIS — E785 Hyperlipidemia, unspecified: Secondary | ICD-10-CM | POA: Insufficient documentation

## 2011-10-05 DIAGNOSIS — R7309 Other abnormal glucose: Secondary | ICD-10-CM

## 2011-10-05 DIAGNOSIS — Z125 Encounter for screening for malignant neoplasm of prostate: Secondary | ICD-10-CM

## 2011-10-10 ENCOUNTER — Other Ambulatory Visit (INDEPENDENT_AMBULATORY_CARE_PROVIDER_SITE_OTHER): Payer: 59

## 2011-10-10 DIAGNOSIS — R7309 Other abnormal glucose: Secondary | ICD-10-CM

## 2011-10-10 DIAGNOSIS — Z125 Encounter for screening for malignant neoplasm of prostate: Secondary | ICD-10-CM

## 2011-10-10 DIAGNOSIS — E785 Hyperlipidemia, unspecified: Secondary | ICD-10-CM

## 2011-10-10 LAB — LIPID PANEL
Cholesterol: 212 mg/dL — ABNORMAL HIGH (ref 0–200)
HDL: 64 mg/dL (ref >39.00)
Total CHOL/HDL Ratio: 3
Triglycerides: 134 mg/dL (ref 0.0–149.0)
VLDL: 26.8 mg/dL (ref 0.0–40.0)

## 2011-10-10 LAB — BASIC METABOLIC PANEL
BUN: 17 mg/dL (ref 6–23)
CO2: 28 mEq/L (ref 19–32)
Calcium: 9.2 mg/dL (ref 8.4–10.5)
Chloride: 103 mEq/L (ref 96–112)
Creatinine, Ser: 1.1 mg/dL (ref 0.4–1.5)
GFR: 76.3 mL/min (ref >60.00)
Glucose, Bld: 90 mg/dL (ref 70–99)
Potassium: 4.3 mEq/L (ref 3.5–5.1)
Sodium: 139 mEq/L (ref 135–145)

## 2011-10-10 LAB — PSA: PSA: 2.12 ng/mL (ref 0.10–4.00)

## 2011-10-10 LAB — LDL CHOLESTEROL, DIRECT: Direct LDL: 130.1 mg/dL

## 2011-10-12 ENCOUNTER — Encounter: Payer: Self-pay | Admitting: Family Medicine

## 2011-10-13 ENCOUNTER — Encounter: Payer: Self-pay | Admitting: Family Medicine

## 2011-10-13 ENCOUNTER — Ambulatory Visit (INDEPENDENT_AMBULATORY_CARE_PROVIDER_SITE_OTHER): Payer: 59 | Admitting: Family Medicine

## 2011-10-13 DIAGNOSIS — F17200 Nicotine dependence, unspecified, uncomplicated: Secondary | ICD-10-CM

## 2011-10-13 DIAGNOSIS — Z0000 Encounter for general adult medical examination without abnormal findings: Secondary | ICD-10-CM | POA: Insufficient documentation

## 2011-10-13 DIAGNOSIS — Z1211 Encounter for screening for malignant neoplasm of colon: Secondary | ICD-10-CM

## 2011-10-13 LAB — POC HEMOCCULT BLD/STL (OFFICE/1-CARD/DIAGNOSTIC): Fecal Occult Blood, POC: NEGATIVE

## 2011-10-13 MED ORDER — PRAVASTATIN SODIUM 40 MG PO TABS: 80.0000 mg | ORAL_TABLET | Freq: Every day | ORAL | Status: DC

## 2011-10-13 NOTE — Assessment & Plan Note (Signed)
Reviewed preventative protocols, updated unless pt declined. Declines flu shot. UTD colonoscopy - hemoccult neg today. DRE/PSA reassuring.  Pt requests yearly screening.  Discussed risk/benefits.

## 2011-10-13 NOTE — Progress Notes (Signed)
Subjective:    Patient ID: Johnny Holland, male    DOB: September 05, 1951, 60 y.o.   MRN: [id number]  HPI CC: CPE  No complaints today.  Smoke free for 2 months!  Using e cig.  Quit date 08/13/2011.  HLD - compliant with pravastatin 80mg  at bedtime.  Uses 40mg  dose because [facility] doesn't have 80s.  Preventative: Colonoscopy 2007.  Diverticulosis Prostate - would like screening done. Tetanus 2007. Stopped flu shots - got sick with them. Wonders about shingles shot.  Medications and allergies reviewed and updated in chart.  Past histories reviewed and updated if relevant as below. Patient Active Problem List  Diagnoses  . NONDEPENDENT TOBACCO USE DISORDER  . DIVERTICULOSIS, COLON  . HYPERGLYCEMIA  . MOTION SICKNESS  . PEPTIC ULCER DISEASE, HX OF  . HLD (hyperlipidemia)   Past Medical History  Diagnosis Date  . Diverticulosis of colon   . Diverticulitis   . Duodenal ulcer   . HLD (hyperlipidemia)   . Tobacco use disorder   . Other abnormal glucose   . Motion sickness   . Colon polyps    Past Surgical History  Procedure Date  . Vasectomy     x 2  . Incise and drain abcess     Hemmorhoid  . Colonoscopy 11/30/06    diverticulosis; 4 polyps   History  Substance Use Topics  . Smoking status: Former Smoker    Quit date: 08/13/2011  . Smokeless tobacco: Not on file   Comment: Using electronic cigarettes now  . Alcohol Use: Yes     Social   Family History  Problem Relation Age of Onset  . Cirrhosis Father     + alcohol  . Heart failure Father   . Emphysema Father     + smoker  . Heart attack Father     x 4  . COPD Mother     + smoker  . Macular degeneration Mother   . Cataracts Mother     Bilateral--removed  . Diabetes Paternal Grandmother   . Breast cancer Maternal Aunt   . Depression Sister   . Alcohol abuse Sister   . Alcohol abuse Father   . Alzheimer's disease      Aunt   Allergies  Allergen Reactions  . Erythromycin     REACTION: ANAPHYLACTIC  SHOCK   No current outpatient prescriptions on file prior to visit.   Review of Systems  Constitutional: Negative for fever, chills, activity change, appetite change, fatigue and unexpected weight change.  HENT: Negative for hearing loss and neck pain.   Eyes: Negative for visual disturbance.  Respiratory: Negative for cough, chest tightness, shortness of breath and wheezing.   Cardiovascular: Negative for chest pain, palpitations and leg swelling.  Gastrointestinal: Negative for nausea, vomiting, abdominal pain, diarrhea, constipation, blood in stool and abdominal distention.  Genitourinary: Negative for hematuria and difficulty urinating.  Musculoskeletal: Negative for myalgias and arthralgias.  Skin: Negative for rash.  Neurological: Negative for dizziness, seizures, syncope and headaches.  Hematological: Does not bruise/bleed easily.  Psychiatric/Behavioral: Negative for dysphoric mood. The patient is not nervous/anxious.        Objective:   Physical Exam  Nursing note and vitals reviewed. Constitutional: He is oriented to person, place, and time. He appears well-developed and well-nourished. No distress.  HENT:  Head: Normocephalic and atraumatic.  Right Ear: External ear normal.  Left Ear: External ear normal.  Nose: Nose normal.  Mouth/Throat: Oropharynx is clear and moist. No oropharyngeal exudate.  Eyes: Conjunctivae and EOM are normal. Pupils are equal, round, and reactive to light. No scleral icterus.  Neck: Normal range of motion. Neck supple.  Cardiovascular: Normal rate, regular rhythm, normal heart sounds and intact distal pulses.   No murmur heard. Pulses:      Radial pulses are 2+ on the right side, and 2+ on the left side.  Pulmonary/Chest: Effort normal and breath sounds normal. No respiratory distress. He has no wheezes. He has no rales.  Abdominal: Soft. Bowel sounds are normal. He exhibits no distension and no mass. There is no tenderness. There is no  rebound and no guarding.  Genitourinary: Prostate normal. Rectal exam shows external hemorrhoid (right noninflammed). Rectal exam shows no internal hemorrhoid, no fissure, no mass, no tenderness and anal tone normal. Guaiac negative stool. Prostate is not enlarged and not tender.  Musculoskeletal: Normal range of motion.  Lymphadenopathy:    He has no cervical adenopathy.  Neurological: He is alert and oriented to person, place, and time.       CN grossly intact, station and gait intact  Skin: Skin is warm and dry. No rash noted.  Psychiatric: He has a normal mood and affect. His behavior is normal. Judgment and thought content normal.      Assessment & Plan:

## 2011-10-13 NOTE — Patient Instructions (Signed)
Good to see you today. Everything looking good today. Congratulations on stopping smoking!  Keep it up. Return to see me in 1 year or as needed.

## 2011-10-13 NOTE — Assessment & Plan Note (Signed)
Congratulated ,encouraged continued abstinence. 

## 2012-05-19 DEATH — deceased

## 2012-06-21 ENCOUNTER — Emergency Department: Payer: Self-pay | Admitting: Emergency Medicine

## 2012-10-09 ENCOUNTER — Ambulatory Visit (INDEPENDENT_AMBULATORY_CARE_PROVIDER_SITE_OTHER)
Admission: RE | Admit: 2012-10-09 | Discharge: 2012-10-09 | Disposition: A | Discharge status: Home / Self Care | Payer: 59 | Source: Ambulatory Visit | Attending: Family Medicine | Admitting: Family Medicine

## 2012-10-09 ENCOUNTER — Ambulatory Visit (INDEPENDENT_AMBULATORY_CARE_PROVIDER_SITE_OTHER): Payer: 59 | Admitting: Family Medicine

## 2012-10-09 ENCOUNTER — Encounter: Payer: Self-pay | Admitting: Family Medicine

## 2012-10-09 ENCOUNTER — Other Ambulatory Visit: Payer: 59

## 2012-10-09 VITALS — BP 128/82 | HR 64 | Temp 98.6°F | Wt 222.2 lb

## 2012-10-09 DIAGNOSIS — M25519 Pain in unspecified shoulder: Secondary | ICD-10-CM

## 2012-10-09 DIAGNOSIS — M25511 Pain in right shoulder: Secondary | ICD-10-CM

## 2012-10-09 DIAGNOSIS — M7581 Other shoulder lesions, right shoulder: Secondary | ICD-10-CM | POA: Insufficient documentation

## 2012-10-09 MED ORDER — NAPROXEN 500 MG PO TABS: ORAL_TABLET | ORAL | Status: DC

## 2012-10-09 MED ORDER — HYDROCODONE-ACETAMINOPHEN 5-500 MG PO TABS: 1.0000 | ORAL_TABLET | Freq: Three times a day (TID) | ORAL | Status: DC | PRN

## 2012-10-09 NOTE — Assessment & Plan Note (Addendum)
Given exam, concern for RTC tear.   Xray today to r/o dislocation or other bony pathology - clear on my read. Will refer to ortho for further evaluation of R shoulder pain. Treat with naprosyn twice daily with food and vicodin for breakthrough pain. Pt agrees with plan.

## 2012-10-09 NOTE — Progress Notes (Signed)
  Subjective:    Patient ID: Johnny Holland, male    DOB: 24-Oct-1951, 61 y.o.   MRN: [id number]  HPI CC: R shoulder pain  DOI: 10/08/2012 Laying on right side on floor reaching behind a dresser, felt painful pop in shoulder, trouble moving shoulder afterwards 2/2 pain.  Tried aleve which didn't help.  Heat to shoulder does help sometimes.  Unable to sleep last night 2/2 pain.  H/o R shoulder issues in past, initially injured at age 61.  Never as bad injury/pain as this pain.  Past Medical History  Diagnosis Date  . Diverticulosis of colon   . Diverticulitis   . Duodenal ulcer   . HLD (hyperlipidemia)   . History of smoking   . Colon polyps     colonoscopy 2007    Past Surgical History  Procedure Date  . Vasectomy     x 2  . Incise and drain abcess     Hemmorhoid  . Colonoscopy 11/30/06    diverticulosis; 4 polyps     Review of Systems Per HPI    Objective:   Physical Exam  Nursing note and vitals reviewed. Constitutional: He appears well-developed and well-nourished. No distress.  Musculoskeletal: He exhibits tenderness. He exhibits no edema.       Right shoulder: He exhibits decreased range of motion, tenderness and pain. He exhibits no bony tenderness, no swelling, no deformity and normal pulse.       Left shoulder: Normal.       No deformity. Holds R arm in adduction Tender to palpation at R shoulder inferior to coracoid process anteriorly as well as some discomfort to palpation posteriorly superior to shoulderblade. Significant pain with external rotation of right arm against resistance. Unable to actively or passively lift shoulder in abduction past 70 degrees. Unable to keep arm in external rotation  Skin: Skin is warm and dry. No rash noted.  Psychiatric: He has a normal mood and affect.       Assessment & Plan:

## 2012-10-09 NOTE — Patient Instructions (Addendum)
I'm worried about a rotator cuff tear. Treat with naprosyn twice daily with food and use hydrocodone as needed for breakthrough pain. I want to refer you to orthopedist for further evaluation - pass by Marion's office to set this up. Good to see you today, call us with questions. Don't take naprosyn with aleve or other over the counter medicine.

## 2012-10-10 ENCOUNTER — Other Ambulatory Visit: Payer: Self-pay | Admitting: Family Medicine

## 2012-10-10 ENCOUNTER — Other Ambulatory Visit: Payer: 59

## 2012-10-10 DIAGNOSIS — E785 Hyperlipidemia, unspecified: Secondary | ICD-10-CM

## 2012-10-10 DIAGNOSIS — Z125 Encounter for screening for malignant neoplasm of prostate: Secondary | ICD-10-CM

## 2012-10-12 ENCOUNTER — Other Ambulatory Visit (INDEPENDENT_AMBULATORY_CARE_PROVIDER_SITE_OTHER): Payer: 59

## 2012-10-12 ENCOUNTER — Encounter: Payer: 59 | Admitting: Family Medicine

## 2012-10-12 DIAGNOSIS — Z125 Encounter for screening for malignant neoplasm of prostate: Secondary | ICD-10-CM

## 2012-10-12 DIAGNOSIS — E785 Hyperlipidemia, unspecified: Secondary | ICD-10-CM

## 2012-10-12 LAB — BASIC METABOLIC PANEL
BUN: 20 mg/dL (ref 6–23)
CO2: 28 mEq/L (ref 19–32)
Calcium: 8.6 mg/dL (ref 8.4–10.5)
Chloride: 104 mEq/L (ref 96–112)
Creatinine, Ser: 1.1 mg/dL (ref 0.4–1.5)
GFR: 70.62 mL/min (ref >60.00)
Glucose, Bld: 87 mg/dL (ref 70–99)
Potassium: 4.4 mEq/L (ref 3.5–5.1)
Sodium: 138 mEq/L (ref 135–145)

## 2012-10-12 LAB — PSA: PSA: 2.11 ng/mL (ref 0.10–4.00)

## 2012-10-12 LAB — LIPID PANEL
Cholesterol: 150 mg/dL (ref 0–200)
HDL: 50.9 mg/dL (ref >39.00)
LDL Cholesterol: 87 mg/dL (ref 0–99)
Total CHOL/HDL Ratio: 3
Triglycerides: 61 mg/dL (ref 0.0–149.0)
VLDL: 12.2 mg/dL (ref 0.0–40.0)

## 2012-10-12 NOTE — Addendum Note (Signed)
Addended by: Alvina Chou on: 10/12/2012 09:42 AM   Modules accepted: Orders

## 2012-10-15 ENCOUNTER — Encounter: Payer: Self-pay | Admitting: Family Medicine

## 2012-10-15 ENCOUNTER — Ambulatory Visit (INDEPENDENT_AMBULATORY_CARE_PROVIDER_SITE_OTHER): Payer: 59 | Admitting: Family Medicine

## 2012-10-15 VITALS — BP 126/84 | HR 60 | Temp 97.9°F | Ht 68.0 in | Wt 222.2 lb

## 2012-10-15 DIAGNOSIS — I359 Nonrheumatic aortic valve disorder, unspecified: Secondary | ICD-10-CM | POA: Insufficient documentation

## 2012-10-15 DIAGNOSIS — M79672 Pain in left foot: Secondary | ICD-10-CM | POA: Insufficient documentation

## 2012-10-15 DIAGNOSIS — M79609 Pain in unspecified limb: Secondary | ICD-10-CM

## 2012-10-15 DIAGNOSIS — M25519 Pain in unspecified shoulder: Secondary | ICD-10-CM

## 2012-10-15 DIAGNOSIS — E785 Hyperlipidemia, unspecified: Secondary | ICD-10-CM

## 2012-10-15 DIAGNOSIS — M25511 Pain in right shoulder: Secondary | ICD-10-CM

## 2012-10-15 DIAGNOSIS — M25531 Pain in right wrist: Secondary | ICD-10-CM | POA: Insufficient documentation

## 2012-10-15 DIAGNOSIS — R011 Cardiac murmur, unspecified: Secondary | ICD-10-CM

## 2012-10-15 DIAGNOSIS — I351 Nonrheumatic aortic (valve) insufficiency: Secondary | ICD-10-CM | POA: Insufficient documentation

## 2012-10-15 DIAGNOSIS — R7309 Other abnormal glucose: Secondary | ICD-10-CM

## 2012-10-15 DIAGNOSIS — Z0000 Encounter for general adult medical examination without abnormal findings: Secondary | ICD-10-CM

## 2012-10-15 DIAGNOSIS — M25539 Pain in unspecified wrist: Secondary | ICD-10-CM

## 2012-10-15 HISTORY — DX: Nonrheumatic aortic (valve) insufficiency: I35.1

## 2012-10-15 MED ORDER — PRAVASTATIN SODIUM 40 MG PO TABS: 80.0000 mg | ORAL_TABLET | Freq: Every day | ORAL | Status: DC

## 2012-10-15 NOTE — Assessment & Plan Note (Signed)
Not typical of CTS but I do presume this is what is currently affecting him as well as mild wrist tendonitis. Treat with NSAIDs and provided with stretching exercises from [facility] pt advisor for CTS as well as wrist brace to use at night. If not better with this, pt will call me for referral to hand.

## 2012-10-15 NOTE — Patient Instructions (Addendum)
Good to see you today, call us with questions. For hand - use wrist brace at night time.  If not better, let me know for referral to hand doctor. Do stretching exercises as provided.

## 2012-10-15 NOTE — Assessment & Plan Note (Signed)
Much improved. Continue f/u with Dr. Dion Saucier.  MRI scheduled for later today.

## 2012-10-15 NOTE — Assessment & Plan Note (Signed)
New blowing murmur best at LUSB.  Will monitor.  Asxs.  If not resolved, consider checking echo.

## 2012-10-15 NOTE — Progress Notes (Addendum)
Subjective:    Patient ID: Johnny Holland, male    DOB: February 01, 1951, 61 y.o.   MRN: [id number]  HPI CC: CPE  See prior note for details.  Seen here last week, concern for RTC tear, so referred to ortho.  Seen by Dr. Dion Saucier.  Has MRI scheduled for today.  Actually shoulder better with NSAIDs and vicodin, but still with significant pain when raising shoulder above 90 degrees.  Wt Readings from Last 3 Encounters:  10/15/12 222 lb 4 oz (100.812 kg)  10/09/12 222 lb 4 oz (100.812 kg)  10/13/11 215 lb 12 oz (97.864 kg)  Poor dietary choices.  R hand pain going on for years.  Feels tightness across knuckles whenever makes fist.  Feels R hand swollen.  Denies numbness, denies weakness.  + h/o tendonitis of R hand.  + joint pains.  Naprosyn hasn't helped.  Also with L sole of foot pain going on for last several months - first 10 steps in morning are worse, also worse pain when standing up after prolonged sitting.  Naprosyn has significantly helped.  Sunscreen use discussed. Seatbelt use discussed.  Preventative: Fluvax - declines Td - 2007 Colonoscopy 2007 - no polyps, diverticulosis [facility]).  Rpt due 10 yrs. Prostate cancer screening - would like to continue.  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  Past histories reviewed and updated if relevant as below. Patient Active Problem List  Diagnosis  . History of smoking  . DIVERTICULOSIS, COLON  . HYPERGLYCEMIA  . MOTION SICKNESS  . PEPTIC ULCER DISEASE, HX OF  . HLD (hyperlipidemia)  . Healthcare maintenance  . Right shoulder pain   Past Medical History  Diagnosis Date  . Diverticulosis of colon   . Diverticulitis   . Duodenal ulcer   . HLD (hyperlipidemia)   . History of smoking   .  Colon polyps     colonoscopy 2007   Past Surgical History  Procedure Date  . Vasectomy     x 2  . Incise and drain abcess     Hemmorhoid  . Colonoscopy 11/30/06    diverticulosis; 4 polyps   History  Substance Use Topics  . Smoking status: Former Smoker -- 1.0 packs/day for 42 years    Quit date: 08/13/2011  . Smokeless tobacco: Never Used   Comment: Using electronic cigarettes now  . Alcohol Use: Yes     Social   Family History  Problem Relation Age of Onset  . Cirrhosis Father     + alcohol  . Heart failure Father   . Emphysema Father     + smoker  . Heart attack Father     x 4  . COPD Mother     + smoker  . Macular degeneration Mother   . Cataracts Mother     Bilateral--removed  . Diabetes Paternal Grandmother   . Breast cancer Maternal Aunt   . Depression Sister   . Alcohol abuse Sister   . Alcohol abuse Father   . Alzheimer's disease      Aunt   Allergies  Allergen Reactions  . Erythromycin     REACTION: ANAPHYLACTIC SHOCK   Current Outpatient Prescriptions on File Prior to Visit  Medication Sig Dispense Refill  . naproxen (NAPROSYN) 500 MG tablet Take one po bid x 1 week then  prn pain, take with food  60 tablet  0  . pravastatin (PRAVACHOL) 40 MG tablet Take 2 tablets (80 mg total) by mouth at bedtime.  180 tablet  3  . HYDROcodone-acetaminophen (VICODIN) 5-500 MG per tablet Take 1 tablet by mouth every 8 (eight) hours as needed for pain.  30 tablet  0     Review of Systems  Constitutional: Negative for fever, chills, activity change, appetite change, fatigue and unexpected weight change.  HENT: Negative for hearing loss and neck pain.   Eyes: Negative for visual disturbance.  Respiratory: Negative for cough, chest tightness, shortness of breath and wheezing.   Cardiovascular: Negative for chest pain, palpitations and leg swelling.  Gastrointestinal: Negative for nausea, vomiting, abdominal pain, diarrhea, constipation, blood in stool and abdominal  distention.  Genitourinary: Negative for hematuria and difficulty urinating.  Musculoskeletal: Negative for myalgias and arthralgias.  Skin: Negative for rash.  Neurological: Negative for dizziness, seizures, syncope and headaches.  Hematological: Does not bruise/bleed easily.  Psychiatric/Behavioral: Negative for dysphoric mood. The patient is not nervous/anxious.        Objective:   Physical Exam  Nursing note and vitals reviewed. Constitutional: He is oriented to person, place, and time. He appears well-developed and well-nourished. No distress.  HENT:  Head: Normocephalic and atraumatic.  Right Ear: External ear normal.  Left Ear: External ear normal.  Nose: Nose normal.  Mouth/Throat: Oropharynx is clear and moist. No oropharyngeal exudate.  Eyes: Conjunctivae normal and EOM are normal. Pupils are equal, round, and reactive to light. No scleral icterus.  Neck: Normal range of motion. Neck supple.  Cardiovascular: Normal rate, regular rhythm and intact distal pulses.   Murmur (blowing 3/6 systolic murmur) heard. Pulses:      Radial pulses are 2+ on the right side, and 2+ on the left side.  Pulmonary/Chest: Effort normal and breath sounds normal. No respiratory distress. He has no wheezes. He has no rales.  Abdominal: Soft. Bowel sounds are normal. He exhibits no distension and no mass. There is no tenderness. There is no rebound and no guarding.  Genitourinary: Prostate normal. Rectal exam shows external hemorrhoid (noninflammed). Rectal exam shows no internal hemorrhoid, no fissure, no mass, no tenderness and anal tone normal. Prostate is not enlarged and not tender.  Musculoskeletal: Normal range of motion. He exhibits no edema.       Pain along medial wrist and forearm of right hand. Pain with extension against resistance at R wrist. No deformity or atrophy noted.  Lymphadenopathy:    He has no cervical adenopathy.  Neurological: He is alert and oriented to person, place,  and time.       CN grossly intact, station and gait intact Neg tinel and phalen but ++ pain with tests at right wrist.  Skin: Skin is warm and dry. No rash noted.  Psychiatric: He has a normal mood and affect. His behavior is normal. Judgment and thought content normal.       Assessment & Plan:

## 2012-10-15 NOTE — Assessment & Plan Note (Signed)
No longer an issue.

## 2012-10-15 NOTE — Assessment & Plan Note (Signed)
Reviewed #s.  Good control on current regimen of 80mg  pravastatin (takes 40mg  2 daily because [facility] doesn't have 80mg  dose)

## 2012-10-15 NOTE — Assessment & Plan Note (Signed)
Preventative protocols reviewed and updated unless pt declined. Discussed healthy diet and lifestyle. Declines flu shot. UTD colonoscopy. DRE/PSA reassuring.  Pt requests to continue yearly screening.

## 2012-10-15 NOTE — Assessment & Plan Note (Signed)
Story consistent with plantar fasciitis - pt on his feet a good portion of every day. Improved with naprosyn. Discussed importance of stretching, provided with stretching exercises from [facility] pt advisor.

## 2012-10-24 ENCOUNTER — Encounter: Payer: Self-pay | Admitting: Family Medicine

## 2013-01-01 ENCOUNTER — Other Ambulatory Visit: Payer: Self-pay | Admitting: *Deleted

## 2013-01-01 MED ORDER — PRAVASTATIN SODIUM 40 MG PO TABS: 80.0000 mg | ORAL_TABLET | Freq: Every day | ORAL | Status: DC

## 2013-05-01 ENCOUNTER — Telehealth: Payer: Self-pay

## 2013-05-01 NOTE — Telephone Encounter (Signed)
Forms in your IN box for review/completion.

## 2013-05-01 NOTE — Telephone Encounter (Signed)
Pt is to start scoba diving training next week and needs form completed prior to classes. Pt will bring form this morning for Dr Sharen Hones to review to see if can fill out without being seen and if needs to be seen needs appt this week.

## 2013-05-01 NOTE — Telephone Encounter (Signed)
Johnny Holland dropped off a scuba diving form to be completed.

## 2013-05-02 DIAGNOSIS — Z0279 Encounter for issue of other medical certificate: Secondary | ICD-10-CM

## 2013-05-02 NOTE — Telephone Encounter (Signed)
Patient notified and paperwork placed up front for pick up.  

## 2013-05-02 NOTE — Telephone Encounter (Signed)
Filled out and placed in Kim's box. 

## 2013-07-16 ENCOUNTER — Other Ambulatory Visit: Payer: Self-pay

## 2013-07-16 MED ORDER — PRAVASTATIN SODIUM 40 MG PO TABS: 80.0000 mg | ORAL_TABLET | Freq: Every day | ORAL | Status: DC

## 2013-07-16 NOTE — Telephone Encounter (Signed)
Pt request refill pravastatin to [facility] [address]. Pt advised done.Pt has CPX already scheduled 09/2013.

## 2013-09-12 ENCOUNTER — Other Ambulatory Visit: Payer: Self-pay | Admitting: Family Medicine

## 2013-10-13 ENCOUNTER — Other Ambulatory Visit: Payer: Self-pay | Admitting: Family Medicine

## 2013-10-13 DIAGNOSIS — R011 Cardiac murmur, unspecified: Secondary | ICD-10-CM

## 2013-10-13 DIAGNOSIS — Z125 Encounter for screening for malignant neoplasm of prostate: Secondary | ICD-10-CM

## 2013-10-13 DIAGNOSIS — E785 Hyperlipidemia, unspecified: Secondary | ICD-10-CM

## 2013-10-14 ENCOUNTER — Other Ambulatory Visit (INDEPENDENT_AMBULATORY_CARE_PROVIDER_SITE_OTHER): Payer: 59

## 2013-10-14 DIAGNOSIS — R011 Cardiac murmur, unspecified: Secondary | ICD-10-CM

## 2013-10-14 DIAGNOSIS — Z87891 Personal history of nicotine dependence: Secondary | ICD-10-CM

## 2013-10-14 DIAGNOSIS — E785 Hyperlipidemia, unspecified: Secondary | ICD-10-CM

## 2013-10-14 DIAGNOSIS — Z125 Encounter for screening for malignant neoplasm of prostate: Secondary | ICD-10-CM

## 2013-10-14 DIAGNOSIS — Z0000 Encounter for general adult medical examination without abnormal findings: Secondary | ICD-10-CM

## 2013-10-14 DIAGNOSIS — K5730 Diverticulosis of large intestine without perforation or abscess without bleeding: Secondary | ICD-10-CM

## 2013-10-14 LAB — CBC WITH DIFFERENTIAL/PLATELET
Basophils Absolute: 0 10*3/uL (ref 0.0–0.1)
Basophils Relative: 0.4 % (ref 0.0–3.0)
Eosinophils Absolute: 0.2 10*3/uL (ref 0.0–0.7)
Eosinophils Relative: 1.9 % (ref 0.0–5.0)
HCT: 43.7 % (ref 39.0–52.0)
Hemoglobin: 14.8 g/dL (ref 13.0–17.0)
Lymphocytes Relative: 21.7 % (ref 12.0–46.0)
Lymphs Abs: 2 10*3/uL (ref 0.7–4.0)
MCHC: 33.8 g/dL (ref 30.0–36.0)
MCV: 86.1 fl (ref 78.0–100.0)
Monocytes Absolute: 0.9 10*3/uL (ref 0.1–1.0)
Monocytes Relative: 10.4 % (ref 3.0–12.0)
Neutro Abs: 6 10*3/uL (ref 1.4–7.7)
Neutrophils Relative %: 65.6 % (ref 43.0–77.0)
Platelets: 187 10*3/uL (ref 150.0–400.0)
RBC: 5.08 Mil/uL (ref 4.22–5.81)
RDW: 13.6 % (ref 11.5–14.6)
WBC: 9.1 10*3/uL (ref 4.5–10.5)

## 2013-10-14 LAB — BASIC METABOLIC PANEL
BUN: 17 mg/dL (ref 6–23)
CO2: 29 mEq/L (ref 19–32)
Calcium: 9.2 mg/dL (ref 8.4–10.5)
Chloride: 104 mEq/L (ref 96–112)
Creatinine, Ser: 1.3 mg/dL (ref 0.4–1.5)
GFR: 62.64 mL/min (ref >60.00)
Glucose, Bld: 99 mg/dL (ref 70–99)
Potassium: 4.7 mEq/L (ref 3.5–5.1)
Sodium: 140 mEq/L (ref 135–145)

## 2013-10-14 LAB — LIPID PANEL
Cholesterol: 190 mg/dL (ref 0–200)
HDL: 65.5 mg/dL (ref >39.00)
LDL Cholesterol: 107 mg/dL — ABNORMAL HIGH (ref 0–99)
Total CHOL/HDL Ratio: 3
Triglycerides: 86 mg/dL (ref 0.0–149.0)
VLDL: 17.2 mg/dL (ref 0.0–40.0)

## 2013-10-14 LAB — PSA: PSA: 1.93 ng/mL (ref 0.10–4.00)

## 2013-10-17 ENCOUNTER — Encounter: Payer: Self-pay | Admitting: Family Medicine

## 2013-10-17 ENCOUNTER — Ambulatory Visit (INDEPENDENT_AMBULATORY_CARE_PROVIDER_SITE_OTHER): Payer: 59 | Admitting: Family Medicine

## 2013-10-17 ENCOUNTER — Encounter: Payer: 59 | Admitting: Family Medicine

## 2013-10-17 VITALS — BP 140/78 | HR 64 | Temp 98.3°F | Ht 68.0 in | Wt 225.8 lb

## 2013-10-17 DIAGNOSIS — E785 Hyperlipidemia, unspecified: Secondary | ICD-10-CM

## 2013-10-17 DIAGNOSIS — Z0000 Encounter for general adult medical examination without abnormal findings: Secondary | ICD-10-CM

## 2013-10-17 DIAGNOSIS — K5730 Diverticulosis of large intestine without perforation or abscess without bleeding: Secondary | ICD-10-CM

## 2013-10-17 NOTE — Assessment & Plan Note (Signed)
Preventative protocols reviewed and updated unless pt declined. Discussed healthy diet and lifestyle.  DRE/PSA reassuring today. 

## 2013-10-17 NOTE — Progress Notes (Signed)
Subjective:    Patient ID: Johnny Holland, male    DOB: [birth year], 62 y.o.   MRN: [id number]  HPI CC: CPE  At home bp running slightly high (145/80).  Sunscreen use discussed.  No suspicious moles.  To see derm 11/2013.  Preventative:  Colonoscopy 2007 - no polyps, diverticulosis [facility]). Rpt due 10 yrs. Prostate cancer screening - would like to continue.  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  Past histories reviewed and updated if relevant as below. Patient Active Problem List   Diagnosis Date Noted  . Right wrist pain 10/15/2012  . Left foot pain 10/15/2012  . Systolic murmur 10/15/2012  . Tendinitis of right rotator cuff 10/09/2012  . Healthcare maintenance 10/13/2011  . HLD (hyperlipidemia) 10/05/2011  . MOTION SICKNESS 07/26/2010  . History of smoking 11/07/2007  . DIVERTICULOSIS, COLON 11/07/2007  . PEPTIC ULCER DISEASE, HX OF 11/07/2007   Past Medical History  Diagnosis Date  . Diverticulosis of colon   . Diverticulitis   . Duodenal ulcer   . HLD (hyperlipidemia)   . History of smoking    Past Surgical History  Procedure Laterality Date  . Vasectomy      x 2  . Incise and drain abcess      Hemmorhoid  . Colonoscopy  11/30/06    diverticulosis; no polyps   History  Substance Use Topics  . Smoking status: Former Smoker -- 1.00 packs/day for 42 years    Quit date: 08/13/2011  . Smokeless tobacco: Never Used     Comment: Using electronic cigarettes now  . Alcohol Use: Yes     Comment: Social   Family History  Problem Relation Age of Onset  . Cirrhosis Father     + alcohol  . Heart failure Father   . Emphysema Father      + smoker  . Heart attack Father 44    x 4  . COPD Mother     + smoker  . Macular degeneration Mother   . Cataracts Mother     Bilateral--removed  . Diabetes Paternal Grandmother   . Breast cancer Maternal Aunt   . Depression Sister   . Alcohol abuse Sister   . Alcohol abuse Father   . Alzheimer's disease      Aunt   Allergies  Allergen Reactions  . Erythromycin     REACTION: ANAPHYLACTIC SHOCK   Current Outpatient Prescriptions on File Prior to Visit  Medication Sig Dispense Refill  . HYDROcodone-acetaminophen (VICODIN) 5-500 MG per tablet Take 1 tablet by mouth every 8 (eight) hours as needed for pain.  30 tablet  0  . naproxen (NAPROSYN) 500 MG tablet Take one po bid x 1 week then prn pain, take with food  60 tablet  0  . pravastatin (PRAVACHOL) 40 MG tablet TAKE 2 TABLETS (80MG ) AT   BEDTIME  180 tablet  2   No current facility-administered medications on file prior to visit.      Review of Systems  Constitutional: Negative for fever, chills, activity change, appetite change, fatigue and unexpected weight change.  HENT: Negative for hearing loss.   Eyes: Negative for visual disturbance.  Respiratory: Negative for cough, chest tightness, shortness of  breath and wheezing.   Cardiovascular: Negative for chest pain and leg swelling.  Gastrointestinal: Negative for nausea, vomiting, abdominal pain, diarrhea, constipation, blood in stool and abdominal distention.  Genitourinary: Negative for hematuria and difficulty urinating.  Musculoskeletal: Negative for arthralgias, myalgias and neck pain.  Skin: Negative for rash.  Neurological: Negative for dizziness, seizures, syncope and headaches.  Hematological: Negative for adenopathy. Does not bruise/bleed easily.  Psychiatric/Behavioral: Negative for dysphoric mood. The patient is not nervous/anxious.        Objective:   Physical Exam  Nursing note and vitals reviewed. Constitutional: He is oriented to person, place,  and time. He appears well-developed and well-nourished. No distress.  HENT:  Head: Normocephalic and atraumatic.  Right Ear: Hearing, tympanic membrane, external ear and ear canal normal.  Left Ear: Hearing, tympanic membrane, external ear and ear canal normal.  Nose: Nose normal.  Mouth/Throat: Oropharynx is clear and moist. No oropharyngeal exudate.  Eyes: Conjunctivae and EOM are normal. Pupils are equal, round, and reactive to light. No scleral icterus.  Neck: Normal range of motion. Neck supple. Carotid bruit is not present. No thyromegaly present.  Cardiovascular: Normal rate, regular rhythm and intact distal pulses.   Murmur (2/6 SEM best at RUSB) heard. Pulses:      Radial pulses are 2+ on the right side, and 2+ on the left side.  Pulmonary/Chest: Effort normal and breath sounds normal. No respiratory distress. He has no wheezes. He has no rales.  Abdominal: Soft. Bowel sounds are normal. He exhibits no distension and no mass. There is no tenderness. There is no rebound and no guarding.  Genitourinary: Rectum normal and prostate normal. Rectal exam shows no external hemorrhoid, no internal hemorrhoid, no fissure, no mass, no tenderness and anal tone normal. Prostate is not enlarged (20gm) and not tender.  Musculoskeletal: Normal range of motion. He exhibits no edema.  Lymphadenopathy:    He has no cervical adenopathy.  Neurological: He is alert and oriented to person, place, and time.  CN grossly intact, station and gait intact  Skin: Skin is warm and dry. No rash noted.  Psychiatric: He has a normal mood and affect. His behavior is normal. Judgment and thought content normal.       Assessment & Plan:

## 2013-10-17 NOTE — Assessment & Plan Note (Signed)
Reviewed #s - given fmhx encouraged restart 80mg  pravastatin or monitor diet more closely.  Pt decided to increase pravastatin to 80mg  daily.

## 2013-10-17 NOTE — Patient Instructions (Signed)
Call your insurance about the shingles shot to see if it is covered or how much it would cost and where is cheaper (here or pharmacy).  If you want to receive here, call for nurse visit.  Either increase pravastatin to 80mg  daily or watch diet more closely. Good to see you today, call us with questions. Return as needdd or in 1 year for next physical.

## 2013-10-17 NOTE — Assessment & Plan Note (Signed)
Discussed need to be seen if persistent LLQ discomfort with fever.

## 2013-10-24 ENCOUNTER — Other Ambulatory Visit: Payer: Self-pay

## 2013-10-27 ENCOUNTER — Other Ambulatory Visit: Payer: Self-pay | Admitting: Family Medicine

## 2013-11-27 ENCOUNTER — Encounter: Payer: Self-pay | Admitting: Family Medicine

## 2013-12-03 ENCOUNTER — Telehealth: Payer: Self-pay

## 2013-12-03 MED ORDER — ZOSTER VACCINE LIVE 19400 UNT/0.65ML ~~LOC~~ SOLR: 0.6500 mL | Freq: Once | SUBCUTANEOUS | Status: DC

## 2013-12-03 NOTE — Telephone Encounter (Signed)
Pt left v/m requesting shingles vaccine order sent to [facility].pt wants to get vaccine this week.Please advise.

## 2013-12-03 NOTE — Telephone Encounter (Signed)
plz notify this has been sent in  To notify us when he receives it to update his chart.

## 2013-12-04 NOTE — Telephone Encounter (Signed)
Patient notified

## 2014-01-07 ENCOUNTER — Encounter: Payer: Self-pay | Admitting: Family Medicine

## 2014-08-06 ENCOUNTER — Other Ambulatory Visit: Payer: Self-pay | Admitting: *Deleted

## 2014-08-06 MED ORDER — PRAVASTATIN SODIUM 40 MG PO TABS: ORAL_TABLET | ORAL | Status: DC

## 2014-08-11 ENCOUNTER — Telehealth: Payer: Self-pay | Admitting: Family Medicine

## 2014-08-11 ENCOUNTER — Telehealth: Payer: Self-pay

## 2014-08-11 MED ORDER — PRAVASTATIN SODIUM 80 MG PO TABS: ORAL_TABLET | ORAL | Status: DC

## 2014-08-11 NOTE — Telephone Encounter (Signed)
Received a faxed refill request from pharmacy for Celecoxib. Medication is not on medication list. Is it okay to refill?

## 2014-08-11 NOTE — Telephone Encounter (Signed)
Johnny Holland said [facility] is less expensive than local pharmacy and request pravastatin 80 mg sent to [facility]. On med list is pravastatin 40 mg taking 2 @ hs. Pt request change to 80 mg taking one at hs. Please advise. Johnny Holland has already cancelled refill of pravastatin 40 mg to [facility] .

## 2014-08-11 NOTE — Telephone Encounter (Signed)
What dose and what sig was requested?

## 2014-08-11 NOTE — Telephone Encounter (Signed)
Johnny Holland called to ck on pravastatin refill; advised was requested and sent to [facility]. Johnny Holland voiced understanding and requested that [facility] be removed from pharmacy list.

## 2014-08-11 NOTE — Telephone Encounter (Signed)
plz notify this was sent to [facility]

## 2014-08-12 MED ORDER — CELECOXIB 200 MG PO CAPS: 200.0000 mg | ORAL_CAPSULE | Freq: Two times a day (BID) | ORAL | Status: DC | PRN

## 2014-08-12 NOTE — Telephone Encounter (Signed)
Message left notifying patient to call me back and advise.

## 2014-08-12 NOTE — Telephone Encounter (Signed)
200 mg 1 BID PRN. Requests 90 day supply if possible.

## 2014-08-12 NOTE — Telephone Encounter (Signed)
Can we check with patient what dose he's been taking of celebrex?

## 2014-08-12 NOTE — Telephone Encounter (Signed)
There was no dose or sig on the request. Form is in your in box.

## 2014-08-12 NOTE — Telephone Encounter (Signed)
Sent in with #90, as I want him to use PRN not daily.

## 2014-08-12 NOTE — Telephone Encounter (Signed)
Message left notifying patient.

## 2014-08-13 NOTE — Telephone Encounter (Signed)
Please close encounter if completed

## 2014-10-13 ENCOUNTER — Other Ambulatory Visit: Payer: Self-pay | Admitting: Family Medicine

## 2014-10-13 ENCOUNTER — Other Ambulatory Visit (INDEPENDENT_AMBULATORY_CARE_PROVIDER_SITE_OTHER): Payer: 59

## 2014-10-13 DIAGNOSIS — Z125 Encounter for screening for malignant neoplasm of prostate: Secondary | ICD-10-CM

## 2014-10-13 DIAGNOSIS — E785 Hyperlipidemia, unspecified: Secondary | ICD-10-CM

## 2014-10-13 LAB — COMPREHENSIVE METABOLIC PANEL
ALT: 20 U/L (ref 0–53)
AST: 24 U/L (ref 0–37)
Albumin: 3.3 g/dL — ABNORMAL LOW (ref 3.5–5.2)
Alkaline Phosphatase: 62 U/L (ref 39–117)
BUN: 15 mg/dL (ref 6–23)
CO2: 30 mEq/L (ref 19–32)
Calcium: 9.1 mg/dL (ref 8.4–10.5)
Chloride: 102 mEq/L (ref 96–112)
Creatinine, Ser: 1.2 mg/dL (ref 0.4–1.5)
GFR: 63.6 mL/min (ref >60.00)
Glucose, Bld: 90 mg/dL (ref 70–99)
Potassium: 4.3 mEq/L (ref 3.5–5.1)
Sodium: 139 mEq/L (ref 135–145)
Total Bilirubin: 0.7 mg/dL (ref 0.2–1.2)
Total Protein: 7.2 g/dL (ref 6.0–8.3)

## 2014-10-13 LAB — LIPID PANEL
Cholesterol: 183 mg/dL (ref 0–200)
HDL: 59.4 mg/dL (ref >39.00)
LDL Cholesterol: 98 mg/dL (ref 0–99)
NonHDL: 123.6
Total CHOL/HDL Ratio: 3
Triglycerides: 127 mg/dL (ref 0.0–149.0)
VLDL: 25.4 mg/dL (ref 0.0–40.0)

## 2014-10-13 LAB — PSA: PSA: 2.21 ng/mL (ref 0.10–4.00)

## 2014-10-20 ENCOUNTER — Ambulatory Visit (INDEPENDENT_AMBULATORY_CARE_PROVIDER_SITE_OTHER): Payer: 59 | Admitting: Family Medicine

## 2014-10-20 ENCOUNTER — Encounter: Payer: Self-pay | Admitting: Family Medicine

## 2014-10-20 VITALS — BP 136/74 | HR 64 | Temp 98.2°F | Ht 68.0 in | Wt 228.0 lb

## 2014-10-20 DIAGNOSIS — R011 Cardiac murmur, unspecified: Secondary | ICD-10-CM

## 2014-10-20 DIAGNOSIS — E785 Hyperlipidemia, unspecified: Secondary | ICD-10-CM

## 2014-10-20 DIAGNOSIS — Z0000 Encounter for general adult medical examination without abnormal findings: Secondary | ICD-10-CM

## 2014-10-20 MED ORDER — PRAVASTATIN SODIUM 80 MG PO TABS: ORAL_TABLET | ORAL | Status: DC

## 2014-10-20 NOTE — Assessment & Plan Note (Signed)
Preventative protocols reviewed and updated unless pt declined. Discussed healthy diet and lifestyle.  DRE/PSA reassuring

## 2014-10-20 NOTE — Assessment & Plan Note (Signed)
Stable, continue pravastatin

## 2014-10-20 NOTE — Assessment & Plan Note (Signed)
Anticipate AS - continue to monitor, overall unchanged from last year, pt asxs.

## 2014-10-20 NOTE — Patient Instructions (Signed)
Good to see you today, you are doing well. Return as needed or in 1 year for next physical.

## 2014-10-20 NOTE — Progress Notes (Signed)
BP 136/74 mmHg  Pulse 64  Temp(Src) 98.2 F (36.8 C)  Ht 5\' 8"  (1.727 m)  Wt 228 lb (103.42 kg)  BMI 34.68 kg/m2   CC: CPE  Subjective:    Patient ID: Johnny Holland, male    DOB: [birth year], 63 y.o.   MRN: [id number]  HPI: Johnny Holland is a 63 y.o. male presenting on 10/20/2014 for Annual Exam   Sunscreen use discussed. lots of sun exposure. No suspicious moles. Sees derm regularly  Preventative: Colonoscopy 2007 - no polyps, diverticulosis [facility]). Rpt due 10 yrs. Prostate cancer screening - would like to continue.  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  Allergies and medications reviewed and updated. Current Outpatient Prescriptions on File Prior to Visit  Medication Sig  . celecoxib (CELEBREX) 200 MG capsule Take 1 capsule (200 mg total) by mouth 2 (two) times daily as needed for moderate pain.  Marland Kitchen HYDROcodone-acetaminophen (VICODIN) 5-500 MG per tablet Take 1 tablet by mouth every 8 (eight) hours as needed for pain.   No current facility-administered medications on file prior to visit.    Review of Systems  Constitutional: Negative for fever, chills, activity change, appetite change, fatigue and unexpected weight change.  HENT: Negative for hearing loss.   Eyes: Negative for visual disturbance.  Respiratory: Positive for cough (recent chest cold). Negative for chest tightness, shortness of breath and wheezing.   Cardiovascular: Negative for chest pain, palpitations and leg swelling.  Gastrointestinal: Negative for nausea, vomiting, abdominal pain, diarrhea, constipation, blood in stool and abdominal distention.    Genitourinary: Negative for hematuria and difficulty urinating.  Musculoskeletal: Negative for myalgias, arthralgias and neck pain.  Skin: Negative for rash.  Neurological: Negative for dizziness, seizures, syncope and headaches (imrpoved when he quit smoking.).  Hematological: Negative for adenopathy. Does not bruise/bleed easily.  Psychiatric/Behavioral: Negative for dysphoric mood. The patient is not nervous/anxious.    Per HPI unless specifically indicated above    Objective:    BP 136/74 mmHg  Pulse 64  Temp(Src) 98.2 F (36.8 C)  Ht 5\' 8"  (1.727 m)  Wt 228 lb (103.42 kg)  BMI 34.68 kg/m2  Physical Exam  Constitutional: He is oriented to person, place, and time. He appears well-developed and well-nourished. No distress.  HENT:  Head: Normocephalic and atraumatic.  Right Ear: Hearing, tympanic membrane, external ear and ear canal normal.  Left Ear: Hearing, tympanic membrane, external ear and ear canal normal.  Nose: Nose normal.  Mouth/Throat: Uvula is midline, oropharynx is clear and moist and mucous membranes are normal. No oropharyngeal exudate, posterior oropharyngeal edema or posterior oropharyngeal erythema.  Eyes: Conjunctivae and EOM are normal. Pupils are equal, round, and reactive to light. No scleral icterus.  Neck: Normal range of motion. Neck supple. Carotid bruit is not present. No thyromegaly present.  Cardiovascular: Normal rate, regular rhythm and intact distal pulses.   Murmur (2/6 SEM best at LUSB with radiation to carotids) heard. Pulses:      Radial pulses are 2+ on the right side, and 2+ on the left side.  Pulmonary/Chest: Effort normal and breath sounds normal. No respiratory distress. He has no wheezes. He has no rales.  Abdominal: Soft. Bowel sounds are  normal. He exhibits no distension and no mass. There is no tenderness. There is no rebound and no guarding.  Genitourinary: Rectum normal and prostate normal. Rectal exam shows no external hemorrhoid,  no internal hemorrhoid, no fissure, no mass, no tenderness and anal tone normal. Prostate is not enlarged (20gm) and not tender.  Musculoskeletal: Normal range of motion. He exhibits no edema.  Lymphadenopathy:    He has no cervical adenopathy.  Neurological: He is alert and oriented to person, place, and time.  CN grossly intact, station and gait intact  Skin: Skin is warm and dry. No rash noted.  Psychiatric: He has a normal mood and affect. His behavior is normal. Judgment and thought content normal.  Nursing note and vitals reviewed.  Results for orders placed or performed in visit on 10/13/14  PSA  Result Value Ref Range   PSA 2.21 0.10 - 4.00 ng/mL  Comprehensive metabolic panel  Result Value Ref Range   Sodium 139 135 - 145 mEq/L   Potassium 4.3 3.5 - 5.1 mEq/L   Chloride 102 96 - 112 mEq/L   CO2 30 19 - 32 mEq/L   Glucose, Bld 90 70 - 99 mg/dL   BUN 15 6 - 23 mg/dL   Creatinine, Ser 1.2 0.4 - 1.5 mg/dL   Total Bilirubin 0.7 0.2 - 1.2 mg/dL   Alkaline Phosphatase 62 39 - 117 U/L   AST 24 0 - 37 U/L   ALT 20 0 - 53 U/L   Total Protein 7.2 6.0 - 8.3 g/dL   Albumin 3.3 (L) 3.5 - 5.2 g/dL   Calcium 9.1 8.4 - 10.5 mg/dL   GFR 63.60 >60.00 mL/min  Lipid panel  Result Value Ref Range   Cholesterol 183 0 - 200 mg/dL   Triglycerides 127.0 0.0 - 149.0 mg/dL   HDL 59.40 >39.00 mg/dL   VLDL 25.4 0.0 - 40.0 mg/dL   LDL Cholesterol 98 0 - 99 mg/dL   Total CHOL/HDL Ratio 3    NonHDL 123.60       Assessment & Plan:   Problem List Items Addressed This Visit    Systolic murmur    Anticipate AS - continue to monitor, overall unchanged from last year, pt asxs.    HLD (hyperlipidemia)    Stable, continue pravastatin.    Relevant Medications      pravastatin (PRAVACHOL) tablet   Healthcare maintenance - Primary    Preventative protocols reviewed and updated unless pt declined. Discussed healthy diet and lifestyle.  DRE/PSA reassuring        Follow up plan: No Follow-up  on file.

## 2015-07-21 ENCOUNTER — Telehealth: Payer: Self-pay

## 2015-07-21 MED ORDER — SCOPOLAMINE 1 MG/3DAYS TD PT72
1.0000 | MEDICATED_PATCH | TRANSDERMAL | Status: DC
Start: 2015-07-21 — End: 2016-05-05

## 2015-07-21 NOTE — Telephone Encounter (Signed)
V/M left; pt going on cruise and doing some diving also; request sea sick patch. Not on med list. Last annual exam on 10/23/2014. [facility].Please advise.

## 2015-07-21 NOTE — Telephone Encounter (Signed)
plz notify sent in. I've sent in 4 patches enough for 12 days at sea. Let me know if needs more.

## 2015-07-21 NOTE — Telephone Encounter (Signed)
Patient notified and said that he thinks 4 will be more than enough.

## 2015-09-19 DEATH — deceased

## 2015-09-25 HISTORY — PX: CT INSERTION OF SUPRAPUBIC CATH: HIS1566

## 2015-10-15 ENCOUNTER — Other Ambulatory Visit: Payer: 59

## 2015-10-22 ENCOUNTER — Encounter: Payer: 59 | Admitting: Family Medicine

## 2015-12-22 ENCOUNTER — Other Ambulatory Visit: Payer: Self-pay | Admitting: *Deleted

## 2015-12-22 MED ORDER — PRAVASTATIN SODIUM 80 MG PO TABS: ORAL_TABLET | ORAL | Status: DC

## 2016-01-04 ENCOUNTER — Other Ambulatory Visit: Payer: Self-pay

## 2016-01-06 ENCOUNTER — Encounter: Payer: Self-pay | Admitting: Family Medicine

## 2016-01-07 ENCOUNTER — Encounter: Payer: Self-pay | Admitting: Family Medicine

## 2016-01-08 ENCOUNTER — Other Ambulatory Visit: Payer: Self-pay | Admitting: Family Medicine

## 2016-01-08 ENCOUNTER — Other Ambulatory Visit (INDEPENDENT_AMBULATORY_CARE_PROVIDER_SITE_OTHER): Payer: 59

## 2016-01-08 DIAGNOSIS — Z125 Encounter for screening for malignant neoplasm of prostate: Secondary | ICD-10-CM

## 2016-01-08 DIAGNOSIS — Z1159 Encounter for screening for other viral diseases: Secondary | ICD-10-CM

## 2016-01-08 DIAGNOSIS — E785 Hyperlipidemia, unspecified: Secondary | ICD-10-CM

## 2016-01-08 LAB — COMPREHENSIVE METABOLIC PANEL
ALT: 20 U/L (ref 0–53)
AST: 18 U/L (ref 0–37)
Albumin: 4.2 g/dL (ref 3.5–5.2)
Alkaline Phosphatase: 65 U/L (ref 39–117)
BUN: 17 mg/dL (ref 6–23)
CO2: 29 mEq/L (ref 19–32)
Calcium: 9.4 mg/dL (ref 8.4–10.5)
Chloride: 103 mEq/L (ref 96–112)
Creatinine, Ser: 1.14 mg/dL (ref 0.40–1.50)
GFR: 69.15 mL/min (ref >60.00)
Glucose, Bld: 98 mg/dL (ref 70–99)
Potassium: 4.7 mEq/L (ref 3.5–5.1)
Sodium: 139 mEq/L (ref 135–145)
Total Bilirubin: 0.6 mg/dL (ref 0.2–1.2)
Total Protein: 7.5 g/dL (ref 6.0–8.3)

## 2016-01-08 LAB — LIPID PANEL
Cholesterol: 173 mg/dL (ref 0–200)
HDL: 60.4 mg/dL (ref >39.00)
LDL Cholesterol: 96 mg/dL (ref 0–99)
NonHDL: 112.28
Total CHOL/HDL Ratio: 3
Triglycerides: 81 mg/dL (ref 0.0–149.0)
VLDL: 16.2 mg/dL (ref 0.0–40.0)

## 2016-01-08 LAB — PSA: PSA: 2.07 ng/mL (ref 0.10–4.00)

## 2016-01-09 LAB — HEPATITIS C ANTIBODY: HCV Ab: NEGATIVE

## 2016-01-12 ENCOUNTER — Ambulatory Visit (INDEPENDENT_AMBULATORY_CARE_PROVIDER_SITE_OTHER): Payer: 59 | Admitting: Family Medicine

## 2016-01-12 ENCOUNTER — Encounter: Payer: Self-pay | Admitting: Family Medicine

## 2016-01-12 VITALS — BP 118/76 | HR 64 | Temp 98.5°F | Wt 229.0 lb

## 2016-01-12 DIAGNOSIS — I6529 Occlusion and stenosis of unspecified carotid artery: Secondary | ICD-10-CM | POA: Insufficient documentation

## 2016-01-12 DIAGNOSIS — E785 Hyperlipidemia, unspecified: Secondary | ICD-10-CM

## 2016-01-12 DIAGNOSIS — Z1211 Encounter for screening for malignant neoplasm of colon: Secondary | ICD-10-CM

## 2016-01-12 DIAGNOSIS — M7581 Other shoulder lesions, right shoulder: Secondary | ICD-10-CM

## 2016-01-12 DIAGNOSIS — R0989 Other specified symptoms and signs involving the circulatory and respiratory systems: Secondary | ICD-10-CM

## 2016-01-12 DIAGNOSIS — Z0000 Encounter for general adult medical examination without abnormal findings: Secondary | ICD-10-CM | POA: Diagnosis not present

## 2016-01-12 HISTORY — DX: Occlusion and stenosis of unspecified carotid artery: I65.29

## 2016-01-12 NOTE — Patient Instructions (Addendum)
HIV screen next visit. Pass by lab for stool kit.  Continue to cut down on diet soda intake - transition to water.  Pass by our referral coordinators to schedule carotid ultrasound. Return as needed or in 1 year for next physical.  Health Maintenance, Male A healthy lifestyle and preventative care can promote health and wellness.  Maintain regular health, dental, and eye exams.  Eat a healthy diet. Foods like vegetables, fruits, whole grains, low-fat dairy products, and lean protein foods contain the nutrients you need and are low in calories. Decrease your intake of foods high in solid fats, added sugars, and salt. Get information about a proper diet from your health care provider, if necessary.  Regular physical exercise is one of the most important things you can do for your health. Most adults should get at least 150 minutes of moderate-intensity exercise (any activity that increases your heart rate and causes you to sweat) each week. In addition, most adults need muscle-strengthening exercises on 2 or more days a week.   Maintain a healthy weight. The body mass index (BMI) is a screening tool to identify possible weight problems. It provides an estimate of body fat based on height and weight. Your health care provider can find your BMI and can help you achieve or maintain a healthy weight. For males 20 years and older:  A BMI below 18.5 is considered underweight.  A BMI of 18.5 to 24.9 is normal.  A BMI of 25 to 29.9 is considered overweight.  A BMI of 30 and above is considered obese.  Maintain normal blood lipids and cholesterol by exercising and minimizing your intake of saturated fat. Eat a balanced diet with plenty of fruits and vegetables. Blood tests for lipids and cholesterol should begin at age 37 and be repeated every 5 years. If your lipid or cholesterol levels are high, you are over age 42, or you are at high risk for heart disease, you may need your cholesterol levels  checked more frequently.Ongoing high lipid and cholesterol levels should be treated with medicines if diet and exercise are not working.  If you smoke, find out from your health care provider how to quit. If you do not use tobacco, do not start.  Lung cancer screening is recommended for adults aged 38-80 years who are at high risk for developing lung cancer because of a history of smoking. A yearly low-dose CT scan of the lungs is recommended for people who have at least a 30-pack-year history of smoking and are current smokers or have quit within the past 15 years. A pack year of smoking is smoking an average of 1 pack of cigarettes a day for 1 year (for example, a 30-pack-year history of smoking could mean smoking 1 pack a day for 30 years or 2 packs a day for 15 years). Yearly screening should continue until the smoker has stopped smoking for at least 15 years. Yearly screening should be stopped for people who develop a health problem that would prevent them from having lung cancer treatment.  If you choose to drink alcohol, do not have more than 2 drinks per day. One drink is considered to be 12 oz (360 mL) of beer, 5 oz (150 mL) of wine, or 1.5 oz (45 mL) of liquor.  Avoid the use of street drugs. Do not share needles with anyone. Ask for help if you need support or instructions about stopping the use of drugs.  High blood pressure causes heart disease and  increases the risk of stroke. High blood pressure is more likely to develop in:  People who have blood pressure in the end of the normal range (100-139/85-89 mm Hg).  People who are overweight or obese.  People who are African American.  If you are 56-55 years of age, have your blood pressure checked every 3-5 years. If you are 27 years of age or older, have your blood pressure checked every year. You should have your blood pressure measured twice--once when you are at a hospital or clinic, and once when you are not at a hospital or clinic.  Record the average of the two measurements. To check your blood pressure when you are not at a hospital or clinic, you can use:  An automated blood pressure machine at a pharmacy.  A home blood pressure monitor.  If you are 51-21 years old, ask your health care provider if you should take aspirin to prevent heart disease.  Diabetes screening involves taking a blood sample to check your fasting blood sugar level. This should be done once every 3 years after age 87 if you are at a normal weight and without risk factors for diabetes. Testing should be considered at a younger age or be carried out more frequently if you are overweight and have at least 1 risk factor for diabetes.  Colorectal cancer can be detected and often prevented. Most routine colorectal cancer screening begins at the age of 39 and continues through age 86. However, your health care provider may recommend screening at an earlier age if you have risk factors for colon cancer. On a yearly basis, your health care provider may provide home test kits to check for hidden blood in the stool. A small camera at the end of a tube may be used to directly examine the colon (sigmoidoscopy or colonoscopy) to detect the earliest forms of colorectal cancer. Talk to your health care provider about this at age 45 when routine screening begins. A direct exam of the colon should be repeated every 5-10 years through age 43, unless early forms of precancerous polyps or small growths are found.  People who are at an increased risk for hepatitis B should be screened for this virus. You are considered at high risk for hepatitis B if:  You were born in a country where hepatitis B occurs often. Talk with your health care provider about which countries are considered high risk.  Your parents were born in a high-risk country and you have not received a shot to protect against hepatitis B (hepatitis B vaccine).  You have HIV or AIDS.  You use needles to  inject street drugs.  You live with, or have sex with, someone who has hepatitis B.  You are a man who has sex with other men (MSM).  You get hemodialysis treatment.  You take certain medicines for conditions like cancer, organ transplantation, and autoimmune conditions.  Hepatitis C blood testing is recommended for all people born from [birth year] through 1963 and any individual with known risk factors for hepatitis C.  Healthy men should no longer receive prostate-specific antigen (PSA) blood tests as part of routine cancer screening. Talk to your health care provider about prostate cancer screening.  Testicular cancer screening is not recommended for adolescents or adult males who have no symptoms. Screening includes self-exam, a health care provider exam, and other screening tests. Consult with your health care provider about any symptoms you have or any concerns you have about testicular cancer.  Practice safe sex. Use condoms and avoid high-risk sexual practices to reduce the spread of sexually transmitted infections (STIs).  You should be screened for STIs, including gonorrhea and chlamydia if:  You are sexually active and are younger than 24 years.  You are older than 24 years, and your health care provider tells you that you are at risk for this type of infection.  Your sexual activity has changed since you were last screened, and you are at an increased risk for chlamydia or gonorrhea. Ask your health care provider if you are at risk.  If you are at risk of being infected with HIV, it is recommended that you take a prescription medicine daily to prevent HIV infection. This is called pre-exposure prophylaxis (PrEP). You are considered at risk if:  You are a man who has sex with other men (MSM).  You are a heterosexual man who is sexually active with multiple partners.  You take drugs by injection.  You are sexually active with a partner who has HIV.  Talk with your health care  provider about whether you are at high risk of being infected with HIV. If you choose to begin PrEP, you should first be tested for HIV. You should then be tested every 3 months for as long as you are taking PrEP.  Use sunscreen. Apply sunscreen liberally and repeatedly throughout the day. You should seek shade when your shadow is shorter than you. Protect yourself by wearing long sleeves, pants, a wide-brimmed hat, and sunglasses year round whenever you are outdoors.  Tell your health care provider of new moles or changes in moles, especially if there is a change in shape or color. Also, tell your health care provider if a mole is larger than the size of a pencil eraser.  A one-time screening for abdominal aortic aneurysm (AAA) and surgical repair of large AAAs by ultrasound is recommended for men aged 82-75 years who are current or former smokers.  Stay current with your vaccines (immunizations).   This information is not intended to replace advice given to you by your health care provider. Make sure you discuss any questions you have with your health care provider.   Document Released: 06/02/2008 Document Revised: 12/26/2014 Document Reviewed: 05/02/2011 Elsevier Interactive Patient Education [employer].

## 2016-01-12 NOTE — Assessment & Plan Note (Signed)
Improved with celebrex.

## 2016-01-12 NOTE — Assessment & Plan Note (Signed)
Preventative protocols reviewed and updated unless pt declined. Discussed healthy diet and lifestyle.  

## 2016-01-12 NOTE — Assessment & Plan Note (Signed)
Chronic, stable. Continue current regimen. 

## 2016-01-12 NOTE — Progress Notes (Signed)
BP 118/76 mmHg  Pulse 64  Temp(Src) 98.5 F (36.9 C) (Oral)  Wt 229 lb (103.874 kg)   CC: CPE  Subjective:    Patient ID: Johnny Holland, male    DOB: 1951/03/08, 65 y.o.   MRN: [id number]  HPI: Johnny Holland is a 65 y.o. male presenting on 01/12/2016 for Annual Exam   Mother passed away in 2023-10-10. R shoulder markedly improved after 2 wk course celebrex.   Preventative: Colonoscopy 2007 - no polyps, diverticulosis Carlean Purl). Rpt due this year.  Prostate cancer screening - will continue Fluvax - declines  Td - 2007 Zostavax - 2014 Seat belt use discussed Sunscreen use discussed.No suspicious moles.Sees derm regularly Scheduled to see eye doctor today.  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 Interim medical history since our last visit reviewed. Allergies and medications reviewed and updated. Current Outpatient Prescriptions on File Prior to Visit  Medication Sig  . pravastatin (PRAVACHOL) 80 MG tablet TAKE 1 TABLET AT BEDTIME   No current facility-administered medications on file prior to visit.    Review of Systems  Constitutional: Negative for fever, chills, activity change, appetite change, fatigue and unexpected weight change.  HENT: Positive for congestion (head colds x2) and sinus pressure. Negative for hearing loss.   Eyes: Negative for visual disturbance.  Respiratory: Negative for cough, chest tightness, shortness of breath and wheezing.   Cardiovascular: Negative for chest pain, palpitations and leg swelling.  Gastrointestinal: Negative for nausea, vomiting, abdominal pain, diarrhea, constipation, blood in stool and  abdominal distention.  Genitourinary: Negative for hematuria and difficulty urinating.  Musculoskeletal: Negative for myalgias, arthralgias and neck pain.       Burning ache in right 4th toe that wakes him up - ongoing for months.  Skin: Negative for rash.  Neurological: Negative for dizziness, seizures, syncope and headaches.  Hematological: Negative for adenopathy. Does not bruise/bleed easily.  Psychiatric/Behavioral: Negative for dysphoric mood. The patient is not nervous/anxious.    Per HPI unless specifically indicated in ROS section     Objective:    BP 118/76 mmHg  Pulse 64  Temp(Src) 98.5 F (36.9 C) (Oral)  Wt 229 lb (103.874 kg)  Wt Readings from Last 3 Encounters:  01/12/16 229 lb (103.874 kg)  10/20/14 228 lb (103.42 kg)  10/17/13 225 lb 12 oz (102.4 kg)    Physical Exam  Constitutional: He is oriented to person, place, and time. He appears well-developed and well-nourished. No distress.  HENT:  Head: Normocephalic and atraumatic.  Right Ear: Hearing, tympanic membrane, external ear and ear canal normal.  Left Ear: Hearing, tympanic membrane, external ear and ear canal normal.  Nose: Nose normal.  Mouth/Throat: Uvula is midline, oropharynx is clear and moist and mucous membranes are normal. No oropharyngeal exudate, posterior oropharyngeal edema or posterior oropharyngeal erythema.  Eyes: Conjunctivae and EOM are normal. Pupils are equal, round, and reactive to light. No scleral icterus.  Neck: Normal range of motion. Neck supple. Carotid bruit is present (faint L bruit). No thyromegaly present.  Cardiovascular: Normal rate, regular rhythm, normal heart sounds and intact distal pulses.   No murmur heard. Pulses:      Radial pulses are 2+ on the right side,  and 2+ on the left side.  Pulmonary/Chest: Effort normal and breath sounds normal. No respiratory distress. He has no wheezes. He has no rales.  Abdominal: Soft. Bowel sounds are normal. He exhibits no distension  and no mass. There is no tenderness. There is no rebound and no guarding.  Genitourinary: Rectum normal and prostate normal. Rectal exam shows no external hemorrhoid, no internal hemorrhoid, no fissure, no mass, no tenderness and anal tone normal. Prostate is not enlarged and not tender.  Musculoskeletal: Normal range of motion. He exhibits no edema.  2+ DP bilaterally Sensation intact No pain with axial loading at 4th right toe  Lymphadenopathy:    He has no cervical adenopathy.  Neurological: He is alert and oriented to person, place, and time.  CN grossly intact, station and gait intact  Skin: Skin is warm and dry. No rash noted.  Psychiatric: He has a normal mood and affect. His behavior is normal. Judgment and thought content normal.  Nursing note and vitals reviewed.  Results for orders placed or performed in visit on 01/08/16  PSA  Result Value Ref Range   PSA 2.07 0.10 - 4.00 ng/mL  Lipid panel  Result Value Ref Range   Cholesterol 173 0 - 200 mg/dL   Triglycerides 81.0 0.0 - 149.0 mg/dL   HDL 60.40 >39.00 mg/dL   VLDL 16.2 0.0 - 40.0 mg/dL   LDL Cholesterol 96 0 - 99 mg/dL   Total CHOL/HDL Ratio 3    NonHDL 112.28   Comprehensive metabolic panel  Result Value Ref Range   Sodium 139 135 - 145 mEq/L   Potassium 4.7 3.5 - 5.1 mEq/L   Chloride 103 96 - 112 mEq/L   CO2 29 19 - 32 mEq/L   Glucose, Bld 98 70 - 99 mg/dL   BUN 17 6 - 23 mg/dL   Creatinine, Ser 1.14 0.40 - 1.50 mg/dL   Total Bilirubin 0.6 0.2 - 1.2 mg/dL   Alkaline Phosphatase 65 39 - 117 U/L   AST 18 0 - 37 U/L   ALT 20 0 - 53 U/L   Total Protein 7.5 6.0 - 8.3 g/dL   Albumin 4.2 3.5 - 5.2 g/dL   Calcium 9.4 8.4 - 10.5 mg/dL   GFR 69.15 >60.00 mL/min      Assessment & Plan:  HIV screen next visit Problem List Items Addressed This Visit    Tendinitis of right rotator cuff    Improved with celebrex.       Left carotid bruit    Will order ultrasound for further evaluation      Relevant Orders    Carotid   HLD (hyperlipidemia)    Chronic, stable. Continue current regimen.      Healthcare maintenance - Primary    Preventative protocols reviewed and updated unless pt declined. Discussed healthy diet and lifestyle.        Other Visit Diagnoses    Special screening for malignant neoplasms, colon        Relevant Orders    Fecal occult blood, imunochemical        Follow up plan: Return in about 1 year (around 01/11/2017), or as needed, for annual exam, prior fasting for blood work.

## 2016-01-12 NOTE — Progress Notes (Signed)
Pre visit review using our clinic review tool, if applicable. No additional management support is needed unless otherwise documented below in the visit note. 

## 2016-01-12 NOTE — Assessment & Plan Note (Signed)
Will order ultrasound for further evaluation

## 2016-01-19 ENCOUNTER — Other Ambulatory Visit: Payer: 59

## 2016-01-19 DIAGNOSIS — Z1211 Encounter for screening for malignant neoplasm of colon: Secondary | ICD-10-CM

## 2016-01-19 LAB — FECAL OCCULT BLOOD, GUAIAC: Fecal Occult Blood: NEGATIVE

## 2016-01-19 LAB — FECAL OCCULT BLOOD, IMMUNOCHEMICAL: Fecal Occult Bld: NEGATIVE

## 2016-01-20 ENCOUNTER — Encounter: Payer: Self-pay | Admitting: *Deleted

## 2016-02-09 ENCOUNTER — Ambulatory Visit (HOSPITAL_COMMUNITY)
Admission: RE | Admit: 2016-02-09 | Discharge: 2016-02-09 | Disposition: A | Discharge status: Home / Self Care | Payer: 59 | Source: Ambulatory Visit | Attending: Family Medicine | Admitting: Family Medicine

## 2016-02-09 DIAGNOSIS — E785 Hyperlipidemia, unspecified: Secondary | ICD-10-CM | POA: Insufficient documentation

## 2016-02-09 DIAGNOSIS — R0989 Other specified symptoms and signs involving the circulatory and respiratory systems: Secondary | ICD-10-CM | POA: Insufficient documentation

## 2016-02-09 DIAGNOSIS — I6523 Occlusion and stenosis of bilateral carotid arteries: Secondary | ICD-10-CM | POA: Diagnosis not present

## 2016-02-14 ENCOUNTER — Encounter: Payer: Self-pay | Admitting: Family Medicine

## 2016-03-16 ENCOUNTER — Other Ambulatory Visit: Payer: Self-pay | Admitting: *Deleted

## 2016-03-16 MED ORDER — CELECOXIB 200 MG PO CAPS: 200.0000 mg | ORAL_CAPSULE | Freq: Two times a day (BID) | ORAL | Status: DC

## 2016-05-05 ENCOUNTER — Other Ambulatory Visit: Payer: Self-pay | Admitting: Family Medicine

## 2016-06-14 ENCOUNTER — Ambulatory Visit (INDEPENDENT_AMBULATORY_CARE_PROVIDER_SITE_OTHER): Payer: 59 | Admitting: Primary Care

## 2016-06-14 ENCOUNTER — Encounter: Payer: Self-pay | Admitting: Primary Care

## 2016-06-14 VITALS — BP 132/70 | HR 57 | Temp 98.7°F | Wt 220.0 lb

## 2016-06-14 DIAGNOSIS — R197 Diarrhea, unspecified: Secondary | ICD-10-CM

## 2016-06-14 LAB — COMPREHENSIVE METABOLIC PANEL
ALT: 24 U/L (ref 0–53)
AST: 19 U/L (ref 0–37)
Albumin: 4 g/dL (ref 3.5–5.2)
Alkaline Phosphatase: 70 U/L (ref 39–117)
BUN: 15 mg/dL (ref 6–23)
CO2: 29 mEq/L (ref 19–32)
Calcium: 9.1 mg/dL (ref 8.4–10.5)
Chloride: 105 mEq/L (ref 96–112)
Creatinine, Ser: 1.1 mg/dL (ref 0.40–1.50)
GFR: 71.96 mL/min (ref >60.00)
Glucose, Bld: 92 mg/dL (ref 70–99)
Potassium: 3.5 mEq/L (ref 3.5–5.1)
Sodium: 138 mEq/L (ref 135–145)
Total Bilirubin: 0.7 mg/dL (ref 0.2–1.2)
Total Protein: 7.4 g/dL (ref 6.0–8.3)

## 2016-06-14 LAB — CBC WITH DIFFERENTIAL/PLATELET
Basophils Absolute: 0 10*3/uL (ref 0.0–0.1)
Basophils Relative: 0.3 % (ref 0.0–3.0)
Eosinophils Absolute: 0.2 10*3/uL (ref 0.0–0.7)
Eosinophils Relative: 2.2 % (ref 0.0–5.0)
HCT: 42 % (ref 39.0–52.0)
Hemoglobin: 14.2 g/dL (ref 13.0–17.0)
Lymphocytes Relative: 20.6 % (ref 12.0–46.0)
Lymphs Abs: 1.7 10*3/uL (ref 0.7–4.0)
MCHC: 33.8 g/dL (ref 30.0–36.0)
MCV: 85.3 fl (ref 78.0–100.0)
Monocytes Absolute: 1.1 10*3/uL — ABNORMAL HIGH (ref 0.1–1.0)
Monocytes Relative: 12.6 % — ABNORMAL HIGH (ref 3.0–12.0)
Neutro Abs: 5.5 10*3/uL (ref 1.4–7.7)
Neutrophils Relative %: 64.3 % (ref 43.0–77.0)
Platelets: 222 10*3/uL (ref 150.0–400.0)
RBC: 4.93 Mil/uL (ref 4.22–5.81)
RDW: 13.5 % (ref 11.5–15.5)
WBC: 8.5 10*3/uL (ref 4.0–10.5)

## 2016-06-14 NOTE — Progress Notes (Signed)
Pre visit review using our clinic review tool, if applicable. No additional management support is needed unless otherwise documented below in the visit note. 

## 2016-06-14 NOTE — Progress Notes (Signed)
Subjective:    Patient ID: Johnny Holland, male    DOB: 1951/01/07, 65 y.o.   MRN: [id number]  HPI  Johnny Holland is a 65 year old male with a history of diverticulosis and PUD who presents today with a chief complaint of diarrhea. His diarrhea has been present for the past 8 days. He was recently on a trip to the [place] for which the diarrhea initiated. He was staying at an all inclusive resort and was careful not to eat or drink anything off of the resort. He's experiencing 5-6 episodes of diarrhea daily that will typically follow after eating.   He also reports abdominal pain that felt like his diverticulitis pain. This pain lasted for about 3 days and has since dissipated, but he does have residual discomfort to his right colon. Denies nausea, vomiting, bloody stools, fevers, chills. No one else in his family has his symptoms. He's tolerating fluids and solids without difficulty, although has experienced a decrease in appetite. He's taken Zantac and pepto bismol without improvement.   Review of Systems  Constitutional: Positive for appetite change. Negative for fever, chills and fatigue.  Respiratory: Negative for cough.   Gastrointestinal: Positive for abdominal pain and diarrhea. Negative for nausea, vomiting, constipation and blood in stool.  Musculoskeletal: Negative for myalgias.       Past Medical History  Diagnosis Date  . Diverticulosis of colon   . Diverticulitis   . Duodenal ulcer   . HLD (hyperlipidemia)   . History of smoking   . Right shoulder pain     impingement with partial RTC tear ([place])  . Carotid stenosis 01/12/2016    Minimal on [name] (01/2016) f/u PRN      Social History   Social History  . Marital Status: Unknown    Spouse Name: N/A  . Number of Children: 3  . Years of Education: N/A   Occupational History  . Sports administrator   Social History Main Topics  . Smoking status: Former Smoker -- 1.00 packs/day for 42  years    Quit date: 08/13/2011  . Smokeless tobacco: Never Used     Comment: Using electronic cigarettes now  . Alcohol Use: Yes     Comment: Social  . Drug Use: No  . Sexual Activity: Not on file   Other Topics Concern  . 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 Vasectomy      x 2  . Incise and drain abcess      Hemmorhoid  . Colonoscopy  11/30/06    diverticulosis; no polyps    Family History  Problem Relation Age of Onset  . Cirrhosis Father     + alcohol  . Heart failure Father   . Emphysema Father     + smoker  . Heart attack Father 30    x 4  . COPD Mother     + smoker  . Macular degeneration Mother   . Cataracts Mother     Bilateral--removed  . Diabetes Paternal Grandmother   . Breast cancer Maternal Aunt   . Depression Sister   . Alcohol abuse Sister   . Alcohol abuse Father   .  Alzheimer's disease      Aunt    Allergies  Allergen Reactions  . Erythromycin     REACTION: ANAPHYLACTIC SHOCK    Current Outpatient Prescriptions on File Prior to Visit  Medication Sig Dispense Refill  . celecoxib (CELEBREX) 200 MG capsule Take 1 capsule (200 mg total) by mouth 2 (two) times daily. 180 capsule 0  . pravastatin (PRAVACHOL) 80 MG tablet TAKE 1 TABLET AT BEDTIME 90 tablet 3  . TRANSDERM-SCOP, 1.5 MG, 1 MG/3DAYS APPLY 1 PATCH ONTO THE SKINEVERY 3 DAYS 4 patch 0   No current facility-administered medications on file prior to visit.    BP 132/70 mmHg  Pulse 57  Temp(Src) 98.7 F (37.1 C) (Oral)  Wt 220 lb (99.791 kg)  SpO2 97%    Objective:   Physical Exam  Constitutional: He appears well-nourished.  Does not appear acutely dehydrated  Neck: Neck supple.    Cardiovascular: Normal rate and regular rhythm.   Pulmonary/Chest: Effort normal and breath sounds normal.  Abdominal: Soft. Normal appearance and bowel sounds are normal. There is tenderness in the right upper quadrant and right lower quadrant. There is no rebound, no guarding, no tenderness at McBurney's point and negative Murphy's sign.  Skin: Skin is warm and dry.          Assessment & Plan:  Diarrhea:  Present for 8 days now, initiated during trip to [place] Exam today without evidence of acute dehydration. Is tolerating fluids and solids. Mild tenderness to right upper and middle colon. Suspect infectious, likely viral, but will rule out other causes.  CBC, CMP, Stool cultures pending. Discussed strict return precautions. Advised to stay hydrated and work on [employer]. Avoid Imodium at this time.

## 2016-06-14 NOTE — Addendum Note (Signed)
Addended by: Royann Shivers A on: 06/14/2016 04:40 PM   Modules accepted: Orders

## 2016-06-14 NOTE — Patient Instructions (Signed)
Complete lab work prior to leaving today. Also provide [name] with a stool specimen if possible. I will notify you of your results once received.   Ensure you are staying hydrated with water to avoid dehydration.  Please notify me if you develop nausea, vomiting, fevers, bloody stools.  It was a pleasure meeting you!  Food Choices to Help Relieve Diarrhea, Adult When you have diarrhea, the foods you eat and your eating habits are very important. Choosing the right foods and drinks can help relieve diarrhea. Also, because diarrhea can last up to 7 days, you need to replace lost fluids and electrolytes (such as sodium, potassium, and chloride) in order to help prevent dehydration.  WHAT GENERAL GUIDELINES DO I NEED TO FOLLOW?  Slowly drink 1 cup (8 oz) of fluid for each episode of diarrhea. If you are getting enough fluid, your urine will be clear or pale yellow.  Eat starchy foods. Some good choices include white rice, white toast, pasta, low-fiber cereal, baked potatoes (without the skin), saltine crackers, and bagels.  Avoid large servings of any cooked vegetables.  Limit fruit to two servings per day. A serving is  cup or 1 small piece.  Choose foods with less than 2 g of fiber per serving.  Limit fats to less than 8 tsp (38 g) per day.  Avoid fried foods.  Eat foods that have probiotics in them. Probiotics can be found in certain dairy products.  Avoid foods and beverages that may increase the speed at which food moves through the stomach and intestines (gastrointestinal tract). Things to avoid include:  High-fiber foods, such as dried fruit, raw fruits and vegetables, nuts, seeds, and whole grain foods.  Spicy foods and high-fat foods.  Foods and beverages sweetened with high-fructose corn syrup, honey, or sugar alcohols such as xylitol, sorbitol, and mannitol. WHAT FOODS ARE RECOMMENDED? Grains White rice. White, [place], or pita breads (fresh or toasted), including plain rolls,  buns, or bagels. White pasta. Saltine, soda, or graham crackers. Pretzels. Low-fiber cereal. Cooked cereals made with water (such as cornmeal, farina, or cream cereals). Plain muffins. Matzo. Melba toast. Zwieback.  Vegetables Potatoes (without the skin). Strained tomato and vegetable juices. Most well-cooked and canned vegetables without seeds. Tender lettuce. Fruits Cooked or canned applesauce, apricots, cherries, fruit cocktail, grapefruit, peaches, pears, or plums. Fresh bananas, apples without skin, cherries, grapes, cantaloupe, grapefruit, peaches, oranges, or plums.  Meat and Other Protein Products Baked or boiled chicken. Eggs. Tofu. Fish. Seafood. Smooth peanut butter. Ground or well-cooked tender beef, ham, veal, lamb, pork, or poultry.  Dairy Plain yogurt, kefir, and unsweetened liquid yogurt. Lactose-free milk, buttermilk, or soy milk. Plain hard cheese. Beverages Sport drinks. Clear broths. Diluted fruit juices (except prune). Regular, caffeine-free sodas such as ginger ale. Water. Decaffeinated teas. Oral rehydration solutions. Sugar-free beverages not sweetened with sugar alcohols. Other Bouillon, broth, or soups made from recommended foods.  The items listed above may not be a complete list of recommended foods or beverages. Contact your dietitian for more options. WHAT FOODS ARE NOT RECOMMENDED? Grains Whole grain, whole wheat, bran, or rye breads, rolls, pastas, crackers, and cereals. Wild or brown rice. Cereals that contain more than 2 g of fiber per serving. Corn tortillas or taco shells. Cooked or dry oatmeal. Granola. Popcorn. Vegetables Raw vegetables. Cabbage, broccoli, Brussels sprouts, artichokes, baked beans, beet greens, corn, kale, legumes, peas, sweet potatoes, and yams. Potato skins. Cooked spinach and cabbage. Fruits Dried fruit, including raisins and dates. Raw fruits. Stewed  or dried prunes. Fresh apples with skin, apricots, mangoes, pears, raspberries, and  strawberries.  Meat and Other Protein Products Chunky peanut butter. Nuts and seeds. Beans and lentils. Berniece Salines.  Dairy High-fat cheeses. Milk, chocolate milk, and beverages made with milk, such as milk shakes. Cream. Ice cream. Sweets and Desserts Sweet rolls, doughnuts, and sweet breads. Pancakes and waffles. Fats and Oils Butter. Cream sauces. Margarine. Salad oils. Plain salad dressings. Olives. Avocados.  Beverages Caffeinated beverages (such as coffee, tea, soda, or energy drinks). Alcoholic beverages. Fruit juices with pulp. Prune juice. Soft drinks sweetened with high-fructose corn syrup or sugar alcohols. Other Coconut. Hot sauce. Chili powder. Mayonnaise. Gravy. Cream-based or milk-based soups.  The items listed above may not be a complete list of foods and beverages to avoid. Contact your dietitian for more information. WHAT SHOULD I DO IF I BECOME DEHYDRATED? Diarrhea can sometimes lead to dehydration. Signs of dehydration include dark urine and dry mouth and skin. If you think you are dehydrated, you should rehydrate with an oral rehydration solution. These solutions can be purchased at pharmacies, retail stores, or online.  Drink -1 cup (120-240 mL) of oral rehydration solution each time you have an episode of diarrhea. If drinking this amount makes your diarrhea worse, try drinking smaller amounts more often. For example, drink 1-3 tsp (5-15 mL) every 5-10 minutes.  A general rule for staying hydrated is to drink 1-2 L of fluid per day. Talk to your health care provider about the specific amount you should be drinking each day. Drink enough fluids to keep your urine clear or pale yellow.   This information is not intended to replace advice given to you by your health care provider. Make sure you discuss any questions you have with your health care provider.   Document Released: 02/25/2004 Document Revised: 12/26/2014 Document Reviewed: 10/28/2013 Elsevier Interactive Patient  Education [employer].

## 2016-06-15 LAB — CLOSTRIDIUM DIFFICILE BY PCR: Toxigenic C. Difficile by PCR: NOT DETECTED

## 2016-06-17 ENCOUNTER — Telehealth: Payer: Self-pay | Admitting: Primary Care

## 2016-06-17 NOTE — Telephone Encounter (Signed)
Please notify patient that his stool cultures are looking normal. This is very reassuring. How's he feeling?

## 2016-06-17 NOTE — Telephone Encounter (Signed)
Spoken and notified patient of Kate's comments.   Patient stated that he is not any better. I went to ask Anda Kraft what can patient take. Anda Kraft stated to notified patient to take imodium and give [name] a call on Monday.   Spoken and notified patient of Kate's comments. Patient verbalized understanding.

## 2016-06-18 LAB — STOOL CULTURE

## 2016-06-19 ENCOUNTER — Encounter: Payer: Self-pay | Admitting: Primary Care

## 2016-06-20 NOTE — Progress Notes (Signed)
Pt called at 12:43pm to state he is feeling much better. Bowel movements have returned to regular. He will call if needs further help.

## 2016-06-23 ENCOUNTER — Other Ambulatory Visit: Payer: Self-pay | Admitting: Family Medicine

## 2016-10-14 ENCOUNTER — Ambulatory Visit: Payer: MEDICAID | Attending: Urology | Admitting: Urology

## 2016-10-14 ENCOUNTER — Encounter (HOSPITAL_BASED_OUTPATIENT_CLINIC_OR_DEPARTMENT_OTHER): Payer: Self-pay | Admitting: Urology

## 2016-10-14 VITALS — BP 118/73 | HR 89 | Temp 98.3°F | Resp 16 | Ht 68.0 in | Wt 187.0 lb

## 2016-10-14 DIAGNOSIS — Z9359 Other cystostomy status: Secondary | ICD-10-CM | POA: Insufficient documentation

## 2016-10-14 DIAGNOSIS — N359 Urethral stricture, unspecified: Secondary | ICD-10-CM | POA: Insufficient documentation

## 2016-10-14 MED ORDER — ALLOPURINOL 300 MG OR TABS: 300.00 mg | ORAL_TABLET | Freq: Every day | ORAL | Status: AC

## 2016-10-14 MED ORDER — ATENOLOL 25 MG OR TABS
25.00 mg | ORAL_TABLET | Freq: Every day | ORAL | Status: DC
Start: ? — End: 2020-07-29

## 2016-10-14 MED ORDER — AMLODIPINE 5 MG OR TABS: 10.00 mg | ORAL_TABLET | Freq: Every day | ORAL | Status: AC

## 2016-10-14 MED ORDER — TIZANIDINE HCL 4 MG OR CAPS
4.00 mg | ORAL_CAPSULE | Freq: Three times a day (TID) | ORAL | Status: DC
Start: ? — End: 2019-08-23

## 2016-10-14 NOTE — Progress Notes (Signed)
@APPTPROVNAME @  @APPTPROVADDR @    Reason for visit: Suprapubic catheter change  Demographics:        Date: 10/207/2017       Patient Name: Stephen [name]       Medical Record #: [id number]       DOB: Jul 25, 1951       Age: 65 year old       Sex: male    [place], [place]  Stephen [name]    HPI: 0000000 with complicated urologic history as listed below presents for SPT change. History was difficult to obtain since he is flustered and tangential with his story. 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 In 2015, he was found to have significant hematuria with clots and thinks that his urologist told him that his "bladder filled up with so much blood that it stretched and stopped working". He reports that when the urologist performed a cysto and clot evacuation, he might have had a bladder perforation. He did not need any surgery to repair this perforation. After the hematuria episode, he needed to start CIC. He reports that a few months into it, he was found to have a urethral stricture. He thinks that the stricture was caused by all the cystoscopies that he needed to undergo. He was then switched to a supra pubic tube. He reports that no one is routinely changing the SPT's. He just goes to the ER as needed for cathter changes and since they are reluctant to do these changes, they asked him to follow up with a urologist and hence he is here. Of note, he reports that he had an interstim placement for this "bladder stretching even though the rep said that it will not be fixed by interstim". But his records show that it was placed in 2012. 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 Prostate surgery in the past      Past Medical History:   Diagnosis Date   . Chronic suprapubic catheter (CMS-HCC)    . Congenital hydronephrosis    . Gout    . Hematuria    . Kidney stones    . Major depressive disorder, single episode    . Urethral stricture        Past Surgical History:   Procedure Laterality Date   . CT INSERTION OF SUPRAPUBIC CATH  09/25/2015   . NEPHRECTOMY Right 1995   . OTHER SURGICAL HISTORY      Interstim 01/29/2011       Allergies   Allergen Reactions   . Sulfa Drugs Unspecified       Current Outpatient Prescriptions   Medication Sig Dispense Refill   . allopurinol (ZYLOPRIM) 300 MG tablet Take 300 mg by mouth daily.     Marland Kitchen amLODIPINE (NORVASC) 5 MG tablet Take 10 mg by mouth daily.     Marland Kitchen atenolol (TENORMIN) 25 MG tablet Take 25 mg by mouth daily.     . tizanidine (ZANAFLEX) 4 MG capsule Take 4 mg by mouth 3 times daily.       No current  facility-administered medications for this visit.        Social History     Social History   . Marital status: Single     Spouse name: N/A   . Number of children: N/A   . Years of education: N/A     Occupational History   . Not on file.     Social History Main Topics   . Smoking status: Never Smoker   . Smokeless tobacco: Never Used   . Alcohol use Not on file   . Drug use: Not on file   . Sexual activity: No     Other Topics Concern   . Not on file     Social History Narrative   . No narrative on file       REVIEW OF SYSTEMS     Constitutional: denies fatigue, night sweats, weight loss, anorexia, fever.  Eyes: denies:  blurry vision, double vision.  Ears, Nose, Mouth, Throat: denies  sore throat, hearing loss.  CV: denies  palpitations, syncope, chest pain, orthopnea.  Resp: denies cough, shortness of breath.  GI: denies vomiting, nausea, abdominal pain, constipation, diarrhea.  GU: Please see HPI.  Musculoskeletal: denies joint pain, muscle weakness.  Integumentary: denies rash, itching.  Neuro: denies  headaches, numbness or tingling.  Psych: denies depressed mood.      Physical Exam:   Vitals:    10/14/16 0847   BP: 118/73   BP Location: Right arm   BP Patient Position: Sitting   BP cuff size: Regular   Pulse: 89   Resp: 16   Temp: 98.3 F (36.8 C)   TempSrc: Temporal Artery   SpO2: 97%   Weight: 84.8 kg (187 lb)   Height: 5\' 8"  (1.727 m)     GENERAL APPEARANCE: The patient is an alert, male in no acute distress who is pacing back and forth in the room.   ENT: Hearing and vision intact  LYMPH NODES: No palpable supra clavicular nodes.   CHEST: Non labored respirations.  ABDOMEN: Soft, Non tender and non distended. SPT with clear urine in place  BACK: No CVA ttp  EXTREMITIES: There is no edema or cyanosis.   SKIN: No rash    Labs/Diagnostic X-rays:    CT A/P 09/14/2016: Right kidney surgically absent. Mild left renal pelviectasis and perinephric stranding. Bladder decompressed with a foley. No bladder calculi    PSA 02/03/16: 0.39    UCx 04/10/15: ESBL sens to Ertapenem, Gent, Macrobid, Zosyn, Bactrim    Assessment/Plan:   0000000 with complicated urologic history that can best be summarized as below.   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 Prostate surgery    Plan:   - SPT changed in the office today without complications  - Offered teaching for SPT changes at home but he is not interested in it at this time  - Follow up in 1 month for a SPT change  - Discussed treatment options for urethral strictures since he reported that he had one. However records show that he actually has a [place]. Will discuss treatment options for [place] at next visit.    Thank you for allowing me to participate in this patient's care.   Sincerely,   Wyn Quaker, DO

## 2016-10-14 NOTE — Interdisciplinary (Signed)
Per Md order.  Pt. here for suprapubictube changed   Allergies verified  Correct patient  Correct site  Correct medicaiton   Correct procedure  16 SILICONE CLEAR CATHETER ,removed 16 F with difficulty at the time of removing catheter, removed very slowly from site  /replaced/irrigated with new foley catheter 49F with no difficulty,drained 146ml urine with small sediments . lidocaine jelly 2% given prior tolerated procedure well, attached to new  Leg drainage bag. Pt will follow   Up .

## 2016-10-14 NOTE — Patient Instructions (Signed)
Please follow up in 1 month for a catheter change    We will try to get medical records from your previous urologist in the interim

## 2016-11-14 ENCOUNTER — Ambulatory Visit: Payer: MEDICAID

## 2016-11-14 DIAGNOSIS — R339 Retention of urine, unspecified: Principal | ICD-10-CM

## 2016-11-14 NOTE — Interdisciplinary (Signed)
10:50    Per Md order.  Pt. here for suprapubictube changed   Allergies verified  Correct patient  Correct site  Correct medicaiton   Correct procedure  16 SILICONE CLEAR CATHETER ,removed 16 F with difficulty at the time of removing catheter, removed very slowly from site  /replaced/irrigated with new foley catheter 10F with no difficulty,drained 178ml  Clear urine  . lidocaine jelly 2% given prior tolerated procedure well, attached to new  Leg drainage bag. Pt will follow   Up .

## 2016-11-29 ENCOUNTER — Encounter: Payer: Self-pay | Admitting: Internal Medicine

## 2016-12-15 ENCOUNTER — Ambulatory Visit: Payer: Medicaid Other | Attending: Surgical

## 2016-12-15 ENCOUNTER — Encounter (HOSPITAL_BASED_OUTPATIENT_CLINIC_OR_DEPARTMENT_OTHER): Payer: Self-pay | Admitting: Urology

## 2016-12-15 DIAGNOSIS — Z9359 Other cystostomy status: Principal | ICD-10-CM

## 2016-12-15 NOTE — Interdisciplinary (Signed)
Order for Urine culture pended for review

## 2016-12-15 NOTE — Interdisciplinary (Signed)
Patient presents to clinic for routine SPT change, per standing order from MD. Patient ambulatory to clinic and in no acute distress.    Preparation:  Allergies verified with patient  Procedure explained  Patient verbalized understanding without objections  Patient positioned and prepped for procedure.    Procedure:  16 Fr SILICONE SPT catheter discontinued with no difficulty after fully deflating cath balloon - 6 mls of water aspirated from cath balloon. Patient tolerated well with minimal discomfort.  Under sterile technique patient prepped with betadine. 16 Fr SILICONE SPT catheter inserted into bladder with no difficulty. 8 mls of sterile water instilled within cath balloon. Bladder irrigated with 42ml sterile water. Clear, yellow urine drained - slight sediment noted. Patient tolerated procedure well with no discomfort. Connected catheter to leg bag to drain by gravity.     During procedure, patient reports foul odor to urine, along with abnormal drainage from SPT site. Urine culture pended for MD sign off.

## 2016-12-19 LAB — URINE CULTURE

## 2016-12-21 ENCOUNTER — Telehealth (INDEPENDENT_AMBULATORY_CARE_PROVIDER_SITE_OTHER): Payer: Self-pay | Admitting: Surgical

## 2016-12-21 NOTE — Telephone Encounter (Signed)
LVM for patient to call back to let us know if sx after SPT change.  If so, can prescribe abx, otherwise no need to treat.

## 2016-12-22 NOTE — Telephone Encounter (Signed)
LVM to call back let us know if he has sx after SPT change.

## 2016-12-23 NOTE — Telephone Encounter (Signed)
Pt calling to f/u on below messages.  Pt states he is feeling well, states was not feeling well during last procedure but now believes that was due to unrelated reasons.    Pt would like a call back to verify results of urine culture he had.    Best contact ph  [phone]

## 2016-12-23 NOTE — Telephone Encounter (Signed)
03:40    RN called & left message to VM to call back.  Message sent test results to [place].

## 2016-12-23 NOTE — Telephone Encounter (Signed)
Spoke with patient.  Confirmed that culture was positive, pt having low back pain but no distinct UTI symptoms.  Feeling much better than when he had his tube changed.  Abx not indicated at this time.  Pt understands, grateful for call.

## 2016-12-28 ENCOUNTER — Telehealth (HOSPITAL_BASED_OUTPATIENT_CLINIC_OR_DEPARTMENT_OTHER): Payer: Self-pay

## 2016-12-28 NOTE — Telephone Encounter (Signed)
LVM that he can try Online for medical supplies, [facility], [facility].

## 2016-12-28 NOTE — Telephone Encounter (Signed)
Pt last came in for a nurse visit 12/15/16 for a catheter change.  He normally gets special gauze that has a pre slit from his pharmacy and has also got some from Urology clinic.  Patient has a few left and is asking if there is a place he can purchase more, as pharmacy no longer carries the gauze.  Please contact patient at [phone] to assist.

## 2017-01-12 ENCOUNTER — Ambulatory Visit: Payer: Medicaid Other | Attending: Urology

## 2017-01-12 DIAGNOSIS — R339 Retention of urine, unspecified: Principal | ICD-10-CM | POA: Insufficient documentation

## 2017-01-12 NOTE — Interdisciplinary (Signed)
PER MD STANDING ORDER  Preparation:  Pt here for routine SPT   catheter change   Allergies verified with patient  Procedure explained  Patient verbalized understanding with no  objections  Patient positioned and prepped for procedure.    Procedure:  Verified the following;   Correct patient  Correct procedure  Correct side and site  Correct position  Removed old [id number] SILICONE foley catheter. . Deflated 9 ml of sterile water from catheter balloon  Prepped patient in sterile fashion with betadine prep, cleanse procedure site      . New 16 F SPT  Silicone foley inserted      Cleansed insertion site with betadine. Inserted foley  aseptically w/o any difficulty 63mls of sterile water  Inflated catheter  balloon. 49mL clear urine drained. Irrigated Therapist, art . Catheter was connected to (1)leg/ overnight bag     Post-procedure:  Patient tolerated procedure well.   Foley cath care instructions given to patient.   Verbalized good understanding of instructions given  Patient was discharged in stable condition   Pt will follow up on 3-4 weeks time

## 2017-01-17 ENCOUNTER — Other Ambulatory Visit: Payer: Self-pay | Admitting: Family Medicine

## 2017-01-19 DIAGNOSIS — J449 Chronic obstructive pulmonary disease, unspecified: Secondary | ICD-10-CM

## 2017-01-19 DIAGNOSIS — I700 Atherosclerosis of aorta: Secondary | ICD-10-CM | POA: Insufficient documentation

## 2017-01-19 DIAGNOSIS — K760 Fatty (change of) liver, not elsewhere classified: Secondary | ICD-10-CM

## 2017-01-19 DIAGNOSIS — I2510 Atherosclerotic heart disease of native coronary artery without angina pectoris: Secondary | ICD-10-CM

## 2017-01-19 HISTORY — DX: Atherosclerosis of aorta: I70.0

## 2017-01-19 HISTORY — DX: Chronic obstructive pulmonary disease, unspecified: J44.9

## 2017-01-19 HISTORY — DX: Fatty (change of) liver, not elsewhere classified: K76.0

## 2017-01-19 HISTORY — DX: Atherosclerotic heart disease of native coronary artery without angina pectoris: I25.10

## 2017-01-27 ENCOUNTER — Other Ambulatory Visit (INDEPENDENT_AMBULATORY_CARE_PROVIDER_SITE_OTHER): Payer: BLUE CROSS/BLUE SHIELD

## 2017-01-27 ENCOUNTER — Other Ambulatory Visit: Payer: Self-pay | Admitting: Family Medicine

## 2017-01-27 ENCOUNTER — Ambulatory Visit: Payer: Medicaid Other | Attending: Urology | Admitting: Urology

## 2017-01-27 ENCOUNTER — Encounter (HOSPITAL_BASED_OUTPATIENT_CLINIC_OR_DEPARTMENT_OTHER): Payer: Self-pay | Admitting: Urology

## 2017-01-27 VITALS — BP 137/75 | HR 54 | Temp 97.5°F | Resp 16

## 2017-01-27 DIAGNOSIS — Z125 Encounter for screening for malignant neoplasm of prostate: Secondary | ICD-10-CM

## 2017-01-27 DIAGNOSIS — E7800 Pure hypercholesterolemia, unspecified: Secondary | ICD-10-CM

## 2017-01-27 DIAGNOSIS — R339 Retention of urine, unspecified: Secondary | ICD-10-CM | POA: Insufficient documentation

## 2017-01-27 DIAGNOSIS — N200 Calculus of kidney: Secondary | ICD-10-CM | POA: Insufficient documentation

## 2017-01-27 LAB — LIPID PANEL
Cholesterol: 170 mg/dL (ref 0–200)
HDL: 55.2 mg/dL (ref >39.00)
LDL Cholesterol: 97 mg/dL (ref 0–99)
NonHDL: 114.77
Total CHOL/HDL Ratio: 3
Triglycerides: 87 mg/dL (ref 0.0–149.0)
VLDL: 17.4 mg/dL (ref 0.0–40.0)

## 2017-01-27 LAB — PSA: PSA: 2.56 ng/mL (ref 0.10–4.00)

## 2017-01-27 LAB — BASIC METABOLIC PANEL
BUN: 18 mg/dL (ref 6–23)
CO2: 29 mEq/L (ref 19–32)
Calcium: 8.9 mg/dL (ref 8.4–10.5)
Chloride: 101 mEq/L (ref 96–112)
Creatinine, Ser: 1.22 mg/dL (ref 0.40–1.50)
GFR: 63.73 mL/min (ref >60.00)
Glucose, Bld: 111 mg/dL — ABNORMAL HIGH (ref 70–99)
Potassium: 4 mEq/L (ref 3.5–5.1)
Sodium: 137 mEq/L (ref 135–145)

## 2017-01-27 MED ORDER — IBUPROFEN 200 MG OR TABS
200.00 mg | ORAL_TABLET | Freq: Four times a day (QID) | ORAL | Status: DC | PRN
Start: ? — End: 2019-08-23

## 2017-01-27 MED ORDER — MELOXICAM 7.5 MG OR TABS
ORAL_TABLET | ORAL | Status: DC
Start: 2017-01-12 — End: 2019-08-23

## 2017-01-27 NOTE — Progress Notes (Signed)
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 He reports that his SPT changes in the clinic have been going well. He denies fevers, chills, suprapubic or flank pain. He was not able to obtain any further medical records from his previous urologists. When asked about his general health, he reports that his arthritis has been worsening. He is interested in getting his [place] repaired so that he can void on his own again. He thinks that his bladder was "under active" at some point however he never had UDS. Regarding his sporadic care with multiple urologists, he reports that he was homeless at one point and is "trying to get his life together" now.     HPI 93/8101: 77Z with complicated urologic history as listed below presents for SPT change. History was difficult to obtain since he is flustered and tangential with his story. 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 In 2015, he was found to have significant hematuria with clots and thinks that his urologist told him that his "bladder filled up with so much blood that it stretched and stopped working". He reports that when the urologist performed a cysto and clot evacuation, he might have had a bladder perforation. He did not need any surgery to repair this perforation. After the hematuria episode, he needed to start CIC. He reports that a few months into it, he was found to have a urethral stricture. He thinks that the stricture was caused by all the cystoscopies that he needed to undergo.  He was then switched to a supra pubic tube. He reports that no one is routinely changing the SPT's. He just goes to the ER as needed for cathter changes and since they are reluctant to do these changes, they asked him to follow up with a urologist and hence he is here. Of note, he reports that he had an interstim placement for this "bladder stretching even though the rep said that it will not be fixed by interstim". But his records show that it was placed in 2012. Surgical time line per his written down records are as follows:    Lithotripsy 2005, 2006, 2007  Interstim 2012  SPT placement 09/25/2015  Last SPT change 08/23/2016 in the ER    History per medical records from [name] a few hours after his visit is as follows:  Cysto SPT 09/25/2015: Mild BNC  ? Prostate surgery in the past    Past Medical History:   Diagnosis Date    Chronic suprapubic catheter (CMS-HCC)     Congenital hydronephrosis     Gout     Hematuria     Kidney stones     Major depressive disorder, single episode     Urethral stricture        Past Surgical History:  Procedure Laterality Date    CT INSERTION OF SUPRAPUBIC CATH  09/25/2015    NEPHRECTOMY Right 1995    OTHER SURGICAL HISTORY      Interstim 01/29/2011       Allergies   Allergen Reactions    Sulfa Drugs Unspecified       Current Outpatient Prescriptions   Medication Sig Dispense Refill    allopurinol (ZYLOPRIM) 300 MG tablet Take 300 mg by mouth daily.      amLODIPINE (NORVASC) 5 MG tablet Take 10 mg by mouth daily.      atenolol (TENORMIN) 25 MG tablet Take 25 mg by mouth daily.      ibuprofen (MOTRIN) 200 MG tablet Take 200 mg by mouth every 6 hours as needed for Mild Pain (Pain Score 1-3).      meloxicam (MOBIC) 7.5 MG tablet        tizanidine (ZANAFLEX) 4 MG capsule Take 4 mg by mouth 3 times daily.       No current facility-administered medications for this visit.        Social History     Social History    Marital status: Single     Spouse name:  N/A    Number of children: N/A    Years of education: N/A     Occupational History    Not on file.     Social History Main Topics    Smoking status: Never Smoker    Smokeless tobacco: Never Used    Alcohol use Not on file    Drug use: Not on file    Sexual activity: No     Other Topics Concern    Not on file     Social History Narrative       REVIEW OF SYSTEMS   Constitutional: denies fatigue, night sweats, weight loss, anorexia, fever.  Eyes: denies:  blurry vision, double vision.  Ears, Nose, Mouth, Throat: denies  sore throat, hearing loss.  CV: denies  palpitations, syncope, chest pain, orthopnea.  Resp: denies cough, shortness of breath.  GI: denies vomiting, nausea, abdominal pain, constipation, diarrhea.  GU: Please see HPI.  Musculoskeletal: denies joint pain, muscle weakness.  Integumentary: denies rash, itching.  Neuro: denies headaches, numbness or tingling.  Psych: denies depressed mood.    Physical Exam:   Vitals:    01/27/17 0830   BP: 137/75   BP Location: Right arm   BP Patient Position: Sitting   BP cuff size: Regular   Pulse: 54   Resp: 16   Temp: 97.5 F (36.4 C)   TempSrc: Temporal Artery       GENERAL APPEARANCE: The patient is an alert, cooperative male in no acute distress.   ENT: Hearing and vision intact  LYMPH NODES: No palpable supra clavicular nodes.   CHEST: Non labored respirations.  ABDOMEN: Soft, Non tender and non distended. SPT site c/d/i  BACK: No CVA ttp  EXTREMITES: There is no edema or cyanosis. B/l finger contractures present  SKIN: No rash    Labs/Diagnostic X-rays:  No results found for: WBC, RBC, HGB, HCT, MCV, MCHC, RDW, PLT, MPV, SEGS, LYMPHS, MONOS, EOS, BASOS    No results found for: BUN, CREAT, CL, NA, K, [place], TBILI, ALB, TP, AST, ALK, BICARB, ALT, GLU    No results found for: PSA    No results found for: COLORUA, APPEARUA, GLUCOSEUA, BILIUA, [place], [place], [place], [place], [place], UROBILUA, NITRITEUA, LEUKESTUA, [place], RBCUA, [place], [place],  CRYSTALSUA, COMMENTSUA  CT A/P 09/14/2016: Right kidney surgically absent. Mild left renal pelviectasis and perinephric stranding. Bladder decompressed with a foley. 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 Prostate surgery    Interested in getting off SPT if possible    Plan:   Considering his history, it would be prudent of evaluate his bladder with UDS to see if it is functional.  If his bladder does function, we can consider a cysto to evaluate the extent of his stricture vs BNC and decide on surgical options.  Will order an RUS to evaluate for nephrolithiasis and hydro in a solitary kidney.  Follow up in 2 months with RUS and UDS results.  Will continue q monthly SPT changes in the clinic in the interim.    Thank you for allowing me to participate in this patient's care.   Sincerely,   Wyn Quaker, DO

## 2017-01-27 NOTE — Patient Instructions (Signed)
Please obtain the RUS and Urodynamics study and follow up with me with the results of the same

## 2017-01-31 ENCOUNTER — Encounter: Payer: Self-pay | Admitting: Family Medicine

## 2017-01-31 ENCOUNTER — Ambulatory Visit (INDEPENDENT_AMBULATORY_CARE_PROVIDER_SITE_OTHER): Payer: BLUE CROSS/BLUE SHIELD | Admitting: Family Medicine

## 2017-01-31 VITALS — BP 122/78 | HR 72 | Temp 98.4°F | Ht 68.0 in | Wt 232.0 lb

## 2017-01-31 DIAGNOSIS — R739 Hyperglycemia, unspecified: Secondary | ICD-10-CM

## 2017-01-31 DIAGNOSIS — H9319 Tinnitus, unspecified ear: Secondary | ICD-10-CM

## 2017-01-31 DIAGNOSIS — N529 Male erectile dysfunction, unspecified: Secondary | ICD-10-CM | POA: Insufficient documentation

## 2017-01-31 DIAGNOSIS — R011 Cardiac murmur, unspecified: Secondary | ICD-10-CM | POA: Diagnosis not present

## 2017-01-31 DIAGNOSIS — Z0000 Encounter for general adult medical examination without abnormal findings: Secondary | ICD-10-CM

## 2017-01-31 DIAGNOSIS — Z87891 Personal history of nicotine dependence: Secondary | ICD-10-CM | POA: Diagnosis not present

## 2017-01-31 DIAGNOSIS — E669 Obesity, unspecified: Secondary | ICD-10-CM | POA: Insufficient documentation

## 2017-01-31 DIAGNOSIS — Z6835 Body mass index (BMI) 35.0-35.9, adult: Secondary | ICD-10-CM | POA: Diagnosis not present

## 2017-01-31 DIAGNOSIS — E7800 Pure hypercholesterolemia, unspecified: Secondary | ICD-10-CM | POA: Diagnosis not present

## 2017-01-31 DIAGNOSIS — Z1211 Encounter for screening for malignant neoplasm of colon: Secondary | ICD-10-CM

## 2017-01-31 DIAGNOSIS — R7303 Prediabetes: Secondary | ICD-10-CM | POA: Insufficient documentation

## 2017-01-31 LAB — POC URINALSYSI DIPSTICK (AUTOMATED)
Bilirubin, UA: NEGATIVE
Blood, UA: NEGATIVE
Glucose, UA: NEGATIVE
Ketones, UA: NEGATIVE
Leukocytes, UA: NEGATIVE
Nitrite, UA: NEGATIVE
Protein, UA: NEGATIVE
Spec Grav, UA: >=1.03
Urobilinogen, UA: 0.2
pH, UA: 6

## 2017-01-31 MED ORDER — SILDENAFIL CITRATE 100 MG PO TABS: 50.0000 mg | ORAL_TABLET | Freq: Every day | ORAL | 3 refills | Status: DC | PRN

## 2017-01-31 NOTE — Addendum Note (Signed)
Addended by: Royann Shivers A on: 01/31/2017 10:54 AM   Modules accepted: Orders

## 2017-01-31 NOTE — Progress Notes (Signed)
BP 122/78   Pulse 72   Temp 98.4 F (36.9 C) (Oral)   Ht 5\' 8"  (1.727 m)   Wt 232 lb (105.2 kg)   BMI 35.28 kg/m    CC: CPE Subjective:    Patient ID: Johnny Holland, male    DOB: Jan 17, 1951, 66 y.o.   MRN: [id number]  HPI: Johnny Holland is a 66 y.o. male presenting on 01/31/2017 for Annual Exam   Retired, then restarted working part time at [employer].  Off diet sodas. Increased water.  Worsening tinnitus, affecting hearing.   Ongoing struggle with ED - maintaining erection. No chest pain or dyspnea with sex.   Preventative: Colonoscopy 2007 - no polyps, diverticulosis Carlean Purl). iFOB normal 2017. Requests continued iFOB Prostate cancer screening - continued.  Lung cancer screening - eligible, interested Fluvax - declines  Td - 2007  Zostavax - 2014 Seat belt use discussed Sunscreen use discussed.No suspicious moles.Sees derm regularly Scheduled to see eye doctor today. 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 Interim medical history since our last visit reviewed. Allergies and medications reviewed and updated. Current Outpatient Prescriptions on File Prior to Visit  Medication Sig  . pravastatin (PRAVACHOL) 80 MG tablet TAKE 1 TABLET AT BEDTIME  . TRANSDERM-SCOP, 1.5 MG, 1 MG/3DAYS APPLY 1 PATCH ONTO THE SKINEVERY 3 DAYS (Patient not taking: Reported on 01/31/2017)   No current facility-administered medications on file prior to visit.     Review of Systems  Constitutional: Negative for activity change, appetite change, chills, fatigue, fever and unexpected  weight change.  HENT: Negative for hearing loss.   Eyes: Negative for visual disturbance.  Respiratory: Positive for wheezing (mild). Negative for cough, chest tightness and shortness of breath.   Cardiovascular: Negative for chest pain, palpitations and leg swelling.  Gastrointestinal: Negative for abdominal distention, abdominal pain, blood in stool, constipation, diarrhea, nausea and vomiting.  Genitourinary: Negative for difficulty urinating and hematuria.  Musculoskeletal: Negative for arthralgias, myalgias and neck pain.  Skin: Negative for rash.  Neurological: Negative for dizziness, seizures, syncope and headaches.  Hematological: Negative for adenopathy. Does not bruise/bleed easily.  Psychiatric/Behavioral: Negative for dysphoric mood. The patient is not nervous/anxious.    Per HPI unless specifically indicated in ROS section     Objective:    BP 122/78   Pulse 72   Temp 98.4 F (36.9 C) (Oral)   Ht 5\' 8"  (1.727 m)   Wt 232 lb (105.2 kg)   BMI 35.28 kg/m   Wt Readings from Last 3 Encounters:  01/31/17 232 lb (105.2 kg)  06/14/16 220 lb (99.8 kg)  01/12/16 229 lb (103.9 kg)    Physical Exam  Constitutional: He is oriented to person, place, and time. He appears well-developed and well-nourished. No distress.  HENT:  Head: Normocephalic and atraumatic.  Right Ear: Hearing, tympanic membrane, external ear and ear canal normal.  Left Ear: Hearing, tympanic membrane, external ear and ear canal normal.  Nose: Nose normal.  Mouth/Throat: Uvula is midline, oropharynx is clear and moist and mucous membranes are normal. No oropharyngeal exudate, posterior oropharyngeal edema or posterior oropharyngeal erythema.  Eyes: Conjunctivae and EOM are normal. Pupils are equal, round, and reactive to light. No scleral icterus.  Neck: Normal range of motion. Neck supple. Carotid bruit is not present. No thyromegaly present.  Cardiovascular: Normal rate, regular rhythm and intact distal  pulses.   Murmur (3/6 SEM best at LUSB) heard. Pulses:      Radial pulses are 2+ on the right side, and 2+ on the left side.  Pulmonary/Chest: Effort normal and breath sounds normal. No respiratory distress. He has no wheezes. He has no rales.  Abdominal: Soft. Bowel sounds are normal. He exhibits no distension and no mass. There is no tenderness. There is no rebound and no guarding.  Genitourinary: Rectum normal and prostate normal. Rectal exam shows no external hemorrhoid, no internal hemorrhoid, no fissure, no mass, no tenderness and anal tone normal. Prostate is not enlarged (20gm) and not tender.  Musculoskeletal: Normal range of motion. He exhibits no edema.  Lymphadenopathy:    He has no cervical adenopathy.  Neurological: He is alert and oriented to person, place, and time.  CN grossly intact, station and gait intact  Skin: Skin is warm and dry. No rash noted.  Psychiatric: He has a normal mood and affect. His behavior is normal. Judgment and thought content normal.  Nursing note and vitals reviewed.  Results for orders placed or performed in visit on 01/27/17  Lipid panel  Result Value Ref Range   Cholesterol 170 0 - 200 mg/dL   Triglycerides 87.0 0.0 - 149.0 mg/dL   HDL 55.20 >39.00 mg/dL   VLDL 17.4 0.0 - 40.0 mg/dL   LDL Cholesterol 97 0 - 99 mg/dL   Total CHOL/HDL Ratio 3    NonHDL [id number]   Basic metabolic panel  Result Value Ref Range   Sodium 137 135 - 145 mEq/L   Potassium 4.0 3.5 - 5.1 mEq/L   Chloride 101 96 - 112 mEq/L   CO2 29 19 - 32 mEq/L   Glucose, Bld 111 (H) 70 - 99 mg/dL   BUN 18 6 - 23 mg/dL   Creatinine, Ser 1.22 0.40 - 1.50 mg/dL   Calcium 8.9 8.4 - 10.5 mg/dL   GFR 63.73 >60.00 mL/min  PSA  Result Value Ref Range   PSA 2.56 0.10 - 4.00 ng/mL      Assessment & Plan:   Problem List Items Addressed This Visit    Erectile dysfunction    Discussed causes.  Will start viagra 50-100mg  PRN Discussed common side effects including HA, monitoring for  priapism.       Healthcare maintenance - Primary    Preventative protocols reviewed and updated unless pt declined. Discussed healthy diet and lifestyle.       History of smoking    Quit late 2011. Discussed lung cancer screening CT scan - pt interested. Will refer.       HLD (hyperlipidemia)    Chronic, stable. Continue pravastatin 80mg  daily.       Relevant Medications   sildenafil (VIAGRA) 100 MG tablet   Hyperglycemia    New - discussed avoiding added sugars and sweetened beverages, rec increased regular exercise for goal weight loss.      Severe obesity (BMI 35.0-35.9 with comorbidity) ([place])    Discussed healthy diet and lifestyle changes to affect sustainable weight loss.       Systolic murmur    Anticipate AS - may be getting louder. Will check echocardiogram.       Relevant Orders   ECHOCARDIOGRAM COMPLETE  Tinnitus    Hearing screen today.        Other Visit Diagnoses    Special screening for malignant neoplasms, colon       Relevant Orders   Fecal occult blood, imunochemical   Ex-smoker       Relevant Orders   Ambulatory Referral for Lung Cancer Scre       Follow up plan: Return in about 1 year (around 01/31/2018) for annual exam, prior fasting for blood work.  Ria Bush, MD

## 2017-01-31 NOTE — Assessment & Plan Note (Signed)
Hearing screen today.

## 2017-01-31 NOTE — Assessment & Plan Note (Signed)
New - discussed avoiding added sugars and sweetened beverages, rec increased regular exercise for goal weight loss.

## 2017-01-31 NOTE — Assessment & Plan Note (Signed)
Discussed healthy diet and lifestyle changes to affect sustainable weight loss  

## 2017-01-31 NOTE — Assessment & Plan Note (Addendum)
Chronic, stable. Continue pravastatin 80mg daily. 

## 2017-01-31 NOTE — Patient Instructions (Addendum)
Urinalysis today  Hearing screen today.  Pass by lab to pick up stool kit. We will refer you for lung cancer screening CT.  We will order baseline heart ultrasound.  Try viagra - coupon provided today.  Return as needed or in 1 year or next physical.  Health Maintenance, Male A healthy lifestyle and preventative care can promote health and wellness.  Maintain regular health, dental, and eye exams.  Eat a healthy diet. Foods like vegetables, fruits, whole grains, low-fat dairy products, and lean protein foods contain the nutrients you need and are low in calories. Decrease your intake of foods high in solid fats, added sugars, and salt. Get information about a proper diet from your health care provider, if necessary.  Regular physical exercise is one of the most important things you can do for your health. Most adults should get at least 150 minutes of moderate-intensity exercise (any activity that increases your heart rate and causes you to sweat) each week. In addition, most adults need muscle-strengthening exercises on 2 or more days a week.   Maintain a healthy weight. The body mass index (BMI) is a screening tool to identify possible weight problems. It provides an estimate of body fat based on height and weight. Your health care provider can find your BMI and can help you achieve or maintain a healthy weight. For males 20 years and older:  A BMI below 18.5 is considered underweight.  A BMI of 18.5 to 24.9 is normal.  A BMI of 25 to 29.9 is considered overweight.  A BMI of 30 and above is considered obese.  Maintain normal blood lipids and cholesterol by exercising and minimizing your intake of saturated fat. Eat a balanced diet with plenty of fruits and vegetables. Blood tests for lipids and cholesterol should begin at age 2 and be repeated every 5 years. If your lipid or cholesterol levels are high, you are over age 35, or you are at high risk for heart disease, you may need your  cholesterol levels checked more frequently.Ongoing high lipid and cholesterol levels should be treated with medicines if diet and exercise are not working.  If you smoke, find out from your health care provider how to quit. If you do not use tobacco, do not start.  Lung cancer screening is recommended for adults aged 42-80 years who are at high risk for developing lung cancer because of a history of smoking. A yearly low-dose CT scan of the lungs is recommended for people who have at least a 30-pack-year history of smoking and are current smokers or have quit within the past 15 years. A pack year of smoking is smoking an average of 1 pack of cigarettes a day for 1 year (for example, a 30-pack-year history of smoking could mean smoking 1 pack a day for 30 years or 2 packs a day for 15 years). Yearly screening should continue until the smoker has stopped smoking for at least 15 years. Yearly screening should be stopped for people who develop a health problem that would prevent them from having lung cancer treatment.  If you choose to drink alcohol, do not have more than 2 drinks per day. One drink is considered to be 12 oz (360 mL) of beer, 5 oz (150 mL) of wine, or 1.5 oz (45 mL) of liquor.  Avoid the use of street drugs. Do not share needles with anyone. Ask for help if you need support or instructions about stopping the use of drugs.  High  blood pressure causes heart disease and increases the risk of stroke. High blood pressure is more likely to develop in:  People who have blood pressure in the end of the normal range (100-139/85-89 mm Hg).  People who are overweight or obese.  People who are African American.  If you are 61-50 years of age, have your blood pressure checked every 3-5 years. If you are 76 years of age or older, have your blood pressure checked every year. You should have your blood pressure measured twice-once when you are at a hospital or clinic, and once when you are not at a  hospital or clinic. Record the average of the two measurements. To check your blood pressure when you are not at a hospital or clinic, you can use:  An automated blood pressure machine at a pharmacy.  A home blood pressure monitor.  If you are 57-30 years old, ask your health care provider if you should take aspirin to prevent heart disease.  Diabetes screening involves taking a blood sample to check your fasting blood sugar level. This should be done once every 3 years after age 35 if you are at a normal weight and without risk factors for diabetes. Testing should be considered at a younger age or be carried out more frequently if you are overweight and have at least 1 risk factor for diabetes.  Colorectal cancer can be detected and often prevented. Most routine colorectal cancer screening begins at the age of 45 and continues through age 42. However, your health care provider may recommend screening at an earlier age if you have risk factors for colon cancer. On a yearly basis, your health care provider may provide home test kits to check for hidden blood in the stool. A small camera at the end of a tube may be used to directly examine the colon (sigmoidoscopy or colonoscopy) to detect the earliest forms of colorectal cancer. Talk to your health care provider about this at age 47 when routine screening begins. A direct exam of the colon should be repeated every 5-10 years through age 67, unless early forms of precancerous polyps or small growths are found.  People who are at an increased risk for hepatitis B should be screened for this virus. You are considered at high risk for hepatitis B if:  You were born in a country where hepatitis B occurs often. Talk with your health care provider about which countries are considered high risk.  Your parents were born in a high-risk country and you have not received a shot to protect against hepatitis B (hepatitis B vaccine).  You have HIV or AIDS.  You  use needles to inject street drugs.  You live with, or have sex with, someone who has hepatitis B.  You are a man who has sex with other men (MSM).  You get hemodialysis treatment.  You take certain medicines for conditions like cancer, organ transplantation, and autoimmune conditions.  Hepatitis C blood testing is recommended for all people born from [birth year] through 1965 and any individual with known risk factors for hepatitis C.  Healthy men should no longer receive prostate-specific antigen (PSA) blood tests as part of routine cancer screening. Talk to your health care provider about prostate cancer screening.  Testicular cancer screening is not recommended for adolescents or adult males who have no symptoms. Screening includes self-exam, a health care provider exam, and other screening tests. Consult with your health care provider about any symptoms you have or any concerns  you have about testicular cancer.  Practice safe sex. Use condoms and avoid high-risk sexual practices to reduce the spread of sexually transmitted infections (STIs).  You should be screened for STIs, including gonorrhea and chlamydia if:  You are sexually active and are younger than 24 years.  You are older than 24 years, and your health care provider tells you that you are at risk for this type of infection.  Your sexual activity has changed since you were last screened, and you are at an increased risk for chlamydia or gonorrhea. Ask your health care provider if you are at risk.  If you are at risk of being infected with HIV, it is recommended that you take a prescription medicine daily to prevent HIV infection. This is called pre-exposure prophylaxis (PrEP). You are considered at risk if:  You are a man who has sex with other men (MSM).  You are a heterosexual man who is sexually active with multiple partners.  You take drugs by injection.  You are sexually active with a partner who has HIV.  Talk with  your health care provider about whether you are at high risk of being infected with HIV. If you choose to begin PrEP, you should first be tested for HIV. You should then be tested every 3 months for as long as you are taking PrEP.  Use sunscreen. Apply sunscreen liberally and repeatedly throughout the day. You should seek shade when your shadow is shorter than you. Protect yourself by wearing long sleeves, pants, a wide-brimmed hat, and sunglasses year round whenever you are outdoors.  Tell your health care provider of new moles or changes in moles, especially if there is a change in shape or color. Also, tell your health care provider if a mole is larger than the size of a pencil eraser.  A one-time screening for abdominal aortic aneurysm (AAA) and surgical repair of large AAAs by ultrasound is recommended for men aged 14-75 years who are current or former smokers.  Stay current with your vaccines (immunizations). This information is not intended to replace advice given to you by your health care provider. Make sure you discuss any questions you have with your health care provider. Document Released: 06/02/2008 Document Revised: 12/26/2014 Document Reviewed: 09/08/2015 Elsevier Interactive Patient Education  2017 [employer].

## 2017-01-31 NOTE — Assessment & Plan Note (Addendum)
Quit late 2011. Discussed lung cancer screening CT scan - pt interested. Will refer.

## 2017-01-31 NOTE — Assessment & Plan Note (Signed)
Preventative protocols reviewed and updated unless pt declined. Discussed healthy diet and lifestyle.  

## 2017-01-31 NOTE — Assessment & Plan Note (Signed)
Discussed causes.  Will start viagra 50-100mg  PRN Discussed common side effects including HA, monitoring for priapism.

## 2017-01-31 NOTE — Progress Notes (Signed)
Pre visit review using our clinic review tool, if applicable. No additional management support is needed unless otherwise documented below in the visit note. 

## 2017-01-31 NOTE — Assessment & Plan Note (Signed)
Anticipate AS - may be getting louder. Will check echocardiogram.

## 2017-02-01 ENCOUNTER — Telehealth: Payer: Self-pay | Admitting: *Deleted

## 2017-02-01 NOTE — Telephone Encounter (Signed)
Received referral for low dose lung cancer screening CT scan. Voicemail left at phone number listed in EMR for patient to call me back to facilitate scheduling scan.  

## 2017-02-02 ENCOUNTER — Telehealth: Payer: Self-pay | Admitting: *Deleted

## 2017-02-02 DIAGNOSIS — Z87891 Personal history of nicotine dependence: Secondary | ICD-10-CM

## 2017-02-02 NOTE — Telephone Encounter (Signed)
Received referral for initial lung cancer screening scan. Contacted patient and obtained smoking history,(former, quit August 2011, 42 pack year) as well as answering questions related to screening process. Patient denies signs of lung cancer such as weight loss or hemoptysis. Patient denies comorbidity that would prevent curative treatment if lung cancer were found. Patient is tentatively scheduled for shared decision making visit and CT scan on 02/07/17, pending insurance approval from business office.

## 2017-02-07 ENCOUNTER — Ambulatory Visit
Admission: RE | Admit: 2017-02-07 | Discharge: 2017-02-07 | Disposition: A | Discharge status: Home / Self Care | Payer: BLUE CROSS/BLUE SHIELD | Source: Ambulatory Visit | Attending: Oncology | Admitting: Oncology

## 2017-02-07 ENCOUNTER — Inpatient Hospital Stay: Payer: BLUE CROSS/BLUE SHIELD | Attending: Oncology | Admitting: Oncology

## 2017-02-07 DIAGNOSIS — I700 Atherosclerosis of aorta: Secondary | ICD-10-CM | POA: Insufficient documentation

## 2017-02-07 DIAGNOSIS — Z122 Encounter for screening for malignant neoplasm of respiratory organs: Secondary | ICD-10-CM

## 2017-02-07 DIAGNOSIS — Z87891 Personal history of nicotine dependence: Secondary | ICD-10-CM

## 2017-02-07 DIAGNOSIS — K760 Fatty (change of) liver, not elsewhere classified: Secondary | ICD-10-CM | POA: Diagnosis not present

## 2017-02-07 DIAGNOSIS — I2510 Atherosclerotic heart disease of native coronary artery without angina pectoris: Secondary | ICD-10-CM | POA: Diagnosis not present

## 2017-02-09 ENCOUNTER — Ambulatory Visit: Payer: Medicaid Other | Attending: Urology

## 2017-02-09 ENCOUNTER — Telehealth (HOSPITAL_BASED_OUTPATIENT_CLINIC_OR_DEPARTMENT_OTHER): Payer: Self-pay | Admitting: Urology

## 2017-02-09 DIAGNOSIS — R339 Retention of urine, unspecified: Principal | ICD-10-CM | POA: Insufficient documentation

## 2017-02-09 DIAGNOSIS — Z9359 Other cystostomy status: Principal | ICD-10-CM

## 2017-02-09 NOTE — Telephone Encounter (Signed)
Disregard below, there is a standing order that is still valid. Routing to MD as Juluis Rainier and closing encounter.

## 2017-02-09 NOTE — Telephone Encounter (Signed)
Per MD LOV note pt to come in monthly for SPT silicone [id number] cath changes. Pended standing order at this time and routed to MD for review/signature.

## 2017-02-09 NOTE — Interdisciplinary (Signed)
Preparation:  Allergies verified with patient  Procedure explained  Patient verbalized understanding w/o objections  Patient positioned and prepped for procedure.    Procedure:  Verified the following;   Correct patient  Correct procedure  Correct side and site  Correct position    Patient presented to clinic for catheter change Per MD standing order. 24mls of water aspirated from cath balloon. Old Silicone [id number] foley catheter removed w/0 difficulty cath tip intact.  Cleansed insertion site with betadine. Inserted silicone foley cath [id number] aseptically w/o any difficulty 83mls of sterile H20 instilled within cath balloon. 229mL clear urine drained. Irrigated with sterile H2O. Catheter was connected to bag to drain by gravity.    Post-procedure:  Patient tolerated procedure well. 0 s/sx of any acute distress noted/reported.  Foley cath care instructions given to patient.   Verbalized good understanding of instructions given  Patient was discharged in stable condition

## 2017-02-11 NOTE — Progress Notes (Signed)
In accordance with CMS guidelines, patient has met eligibility criteria including age, absence of signs or symptoms of lung cancer.  Social History  Substance Use Topics  . Smoking status: Former Smoker    Packs/day: 1.00    Years: 42.00    Quit date: 08/13/2011  . Smokeless tobacco: Never Used     Comment: Using electronic cigarettes now  . Alcohol use Yes     Comment: Social     A shared decision-making session was conducted prior to the performance of CT scan. This includes one or more decision aids, includes benefits and harms of screening, follow-up diagnostic testing, over-diagnosis, false positive rate, and total radiation exposure.  Counseling on the importance of adherence to annual lung cancer LDCT screening, impact of co-morbidities, and ability or willingness to undergo diagnosis and treatment is imperative for compliance of the program.  Counseling on the importance of continued smoking cessation for former smokers; the importance of smoking cessation for current smokers, and information about tobacco cessation interventions have been given to patient including [place] and [facility] quit [place] programs.  Written order for lung cancer screening with LDCT has been given to the patient and any and all questions have been answered to the best of my abilities.   Yearly follow up will be coordinated by Burgess Estelle, Thoracic Navigator.

## 2017-02-13 ENCOUNTER — Other Ambulatory Visit (INDEPENDENT_AMBULATORY_CARE_PROVIDER_SITE_OTHER): Payer: BLUE CROSS/BLUE SHIELD

## 2017-02-13 ENCOUNTER — Telehealth: Payer: Self-pay | Admitting: *Deleted

## 2017-02-13 DIAGNOSIS — Z1211 Encounter for screening for malignant neoplasm of colon: Secondary | ICD-10-CM | POA: Diagnosis not present

## 2017-02-13 LAB — FECAL OCCULT BLOOD, IMMUNOCHEMICAL: Fecal Occult Bld: NEGATIVE

## 2017-02-13 LAB — FECAL OCCULT BLOOD, GUAIAC: Fecal Occult Blood: NEGATIVE

## 2017-02-13 NOTE — Telephone Encounter (Signed)
Notified patient of LDCT lung cancer screening results with recommendation for 12 month follow up imaging. Also notified of incidental finding noted below and encouraged to discuss with PCP. Patient verbalizes understanding. This note will be forwarded to PCP via Epic.  IMPRESSION: 1. Lung-RADS Category 1S, negative. Continue annual screening with low-dose chest CT without contrast in 12 months. 2. The "S" modifier above refers to potentially clinically significant non lung cancer related findings. Specifically, there is aortic atherosclerosis, in addition to left main and 3 vessel coronary artery disease. Please note that although the presence of coronary artery calcium documents the presence of coronary artery disease, the severity of this disease and any potential stenosis cannot be assessed on this non-gated CT examination. Assessment for potential risk factor modification, dietary therapy or pharmacologic therapy may be warranted, if clinically indicated. 3. There are calcifications of the aortic valve. Echocardiographic correlation for evaluation of potential valvular dysfunction may be warranted if clinically indicated. 4. Mild hepatic steatosis.

## 2017-02-14 ENCOUNTER — Encounter: Payer: Self-pay | Admitting: Family Medicine

## 2017-02-14 ENCOUNTER — Encounter: Payer: Self-pay | Admitting: *Deleted

## 2017-02-14 ENCOUNTER — Ambulatory Visit
Admission: RE | Admit: 2017-02-14 | Discharge: 2017-02-14 | Disposition: A | Discharge status: Home / Self Care | Payer: Medicaid Other | Attending: Body Imaging | Admitting: Body Imaging

## 2017-02-14 DIAGNOSIS — R339 Retention of urine, unspecified: Secondary | ICD-10-CM

## 2017-02-14 DIAGNOSIS — Z4682 Encounter for fitting and adjustment of non-vascular catheter: Secondary | ICD-10-CM | POA: Insufficient documentation

## 2017-02-14 DIAGNOSIS — N200 Calculus of kidney: Secondary | ICD-10-CM

## 2017-02-14 DIAGNOSIS — Z905 Acquired absence of kidney: Secondary | ICD-10-CM | POA: Insufficient documentation

## 2017-02-14 DIAGNOSIS — N1330 Unspecified hydronephrosis: Principal | ICD-10-CM | POA: Insufficient documentation

## 2017-02-16 ENCOUNTER — Ambulatory Visit (INDEPENDENT_AMBULATORY_CARE_PROVIDER_SITE_OTHER): Payer: BLUE CROSS/BLUE SHIELD

## 2017-02-16 ENCOUNTER — Other Ambulatory Visit: Payer: Self-pay

## 2017-02-16 ENCOUNTER — Other Ambulatory Visit: Payer: Self-pay | Admitting: Family Medicine

## 2017-02-16 DIAGNOSIS — R011 Cardiac murmur, unspecified: Secondary | ICD-10-CM

## 2017-02-16 DIAGNOSIS — I7789 Other specified disorders of arteries and arterioles: Secondary | ICD-10-CM

## 2017-02-20 ENCOUNTER — Ambulatory Visit (INDEPENDENT_AMBULATORY_CARE_PROVIDER_SITE_OTHER)
Admission: RE | Admit: 2017-02-20 | Discharge: 2017-02-20 | Disposition: A | Discharge status: Home / Self Care | Payer: BLUE CROSS/BLUE SHIELD | Source: Ambulatory Visit | Attending: Family Medicine | Admitting: Family Medicine

## 2017-02-20 DIAGNOSIS — R011 Cardiac murmur, unspecified: Secondary | ICD-10-CM

## 2017-02-20 DIAGNOSIS — I779 Disorder of arteries and arterioles, unspecified: Secondary | ICD-10-CM | POA: Diagnosis not present

## 2017-02-20 DIAGNOSIS — I7789 Other specified disorders of arteries and arterioles: Secondary | ICD-10-CM

## 2017-02-20 MED ORDER — IOPAMIDOL (ISOVUE-370) INJECTION 76%
100.0000 mL | Freq: Once | INTRAVENOUS | Status: AC | PRN
  Administered 2017-02-20: 100 mL via INTRAVENOUS

## 2017-02-23 ENCOUNTER — Telehealth: Payer: Self-pay | Admitting: Family Medicine

## 2017-02-23 ENCOUNTER — Telehealth (HOSPITAL_BASED_OUTPATIENT_CLINIC_OR_DEPARTMENT_OTHER): Payer: Self-pay | Admitting: Urology

## 2017-02-23 NOTE — Telephone Encounter (Signed)
Could you look at his results in Dr. Synthia Innocent absence, please? Thanks!

## 2017-02-23 NOTE — Telephone Encounter (Signed)
Pt called checking on his ct results.  He is at work he stated the best time to call him today would be between 11:30 and 12.

## 2017-02-23 NOTE — Telephone Encounter (Signed)
Message left advising patient.  

## 2017-02-23 NOTE — Telephone Encounter (Signed)
Call pt.  Good news.   1. No evidence of aortic root mass. 2. No acute findings in the thorax. 3. The thickening calcifications of the aortic valve redemonstrated. This isn't a new finding.   4. Aortic atherosclerosis, in addition to left main and 3 vessel coronary artery disease.  The the presence of coronary artery calcium documents the presence of coronary artery disease, the severity of this disease and any potential stenosis cannot be assessed on this non-gated CT examination. I'll defer that consideration to Dr. Darnell Level.  5. Mild diffuse bronchial wall thickening with mild centrilobular and paraseptal emphysema; imaging findings suggestive of underlying COPD.  Again I'll defer that to Dr. Darnell Level.   Long story short- no mass.  Thanks.

## 2017-02-23 NOTE — Telephone Encounter (Signed)
Pt calling requesting call with [name] result please assist by calling [phone] thank you.

## 2017-02-23 NOTE — Telephone Encounter (Signed)
Narrative   EXAM DESCRIPTION:  US KIDNEY COMPLETE    CLINICAL HISTORY:  S/p right nephrectomy    TECHNIQUE:  Per Protocol.    COMPARISON:  None available.    FINDINGS:  RIGHT KIDNEY:    S/p right nephrectomy            LEFT KIDNEY:    14.7 cm in long axis.    Within normal limits with no renal calculi. Mild left hydronephrosis.    Blood flow present.        BLADDER:    Suprapubic Foley catheter.    Suboptimally distended and unable to be completely evaluated.    CONCURRENT SUPERVISION:  I have reviewed the images and agree with the resident's interpretation.         Impression   IMPRESSION:  Mild left hydronephrosis. No renal stones.    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     Routing to MD for review.

## 2017-02-24 NOTE — Telephone Encounter (Signed)
Patient is calling to follow up his ultrasound results.  Please contact patient to further assist.

## 2017-02-26 ENCOUNTER — Encounter: Payer: Self-pay | Admitting: Family Medicine

## 2017-02-26 DIAGNOSIS — I351 Nonrheumatic aortic (valve) insufficiency: Secondary | ICD-10-CM

## 2017-02-27 ENCOUNTER — Telehealth (HOSPITAL_BASED_OUTPATIENT_CLINIC_OR_DEPARTMENT_OTHER): Payer: Self-pay | Admitting: Urology

## 2017-02-27 ENCOUNTER — Encounter (HOSPITAL_BASED_OUTPATIENT_CLINIC_OR_DEPARTMENT_OTHER): Payer: Self-pay | Admitting: Urology

## 2017-02-27 DIAGNOSIS — N1330 Unspecified hydronephrosis: Secondary | ICD-10-CM

## 2017-02-27 NOTE — Telephone Encounter (Signed)
Called patient at his home phone no [phone]) to inform him of his RUS results. 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 I asked him to obtain a BMP as soon as possible to evaluate for AKI. He also reports that he hasn't scheduled the VUDS yet. I asked him to call our office tomorrow during business hours to schedule the VUDS as soon as possible so that we can evaluate for reflux along with bladder function. He reports that he will do the same tomorrow.

## 2017-02-27 NOTE — Telephone Encounter (Signed)
Patient is returning Dr. Ephriam Jenkins call in regards of the results below.    Please advise:  [phone]

## 2017-02-28 ENCOUNTER — Other Ambulatory Visit: Payer: Medicaid Other | Attending: Surgical

## 2017-02-28 DIAGNOSIS — N1330 Unspecified hydronephrosis: Secondary | ICD-10-CM | POA: Insufficient documentation

## 2017-02-28 DIAGNOSIS — Z9359 Other cystostomy status: Principal | ICD-10-CM | POA: Insufficient documentation

## 2017-02-28 LAB — BASIC METABOLIC PANEL, BLOOD
Anion Gap: 15 mmol/L (ref 7–15)
BUN: 22 mg/dL (ref 8–23)
Bicarbonate: 25 mmol/L (ref 22–29)
Calcium: 9.3 mg/dL (ref 8.5–10.6)
Chloride: 100 mmol/L (ref 98–107)
Creatinine: 1.16 mg/dL (ref 0.67–1.17)
GFR: >60 mL/min
Glucose: 108 mg/dL — ABNORMAL HIGH (ref 70–99)
Potassium: 4.4 mmol/L (ref 3.5–5.1)
Sodium: 140 mmol/L (ref 136–145)

## 2017-03-02 ENCOUNTER — Encounter: Payer: Self-pay | Admitting: Family Medicine

## 2017-03-02 NOTE — Telephone Encounter (Signed)
This matter was handled in separate encounter by MD 02/27/17.

## 2017-03-03 LAB — URINE CULTURE

## 2017-03-06 ENCOUNTER — Telehealth (INDEPENDENT_AMBULATORY_CARE_PROVIDER_SITE_OTHER): Payer: Self-pay

## 2017-03-06 NOTE — Telephone Encounter (Signed)
Pt is being referred to have VUDS by Dr Reece Levy. Pt call back number [phone]. Please advise, thank you

## 2017-03-07 ENCOUNTER — Telehealth (HOSPITAL_BASED_OUTPATIENT_CLINIC_OR_DEPARTMENT_OTHER): Payer: Self-pay | Admitting: Urology

## 2017-03-07 NOTE — Telephone Encounter (Signed)
I have contacted patient, there's no answer. I left him a detailed message that his order for Video UDS has been received and I will now be able to schedule his appointment. Patient informed to return my call if he would like to schedule.    VIDEOURODYNAMICS SERVICE REQUEST   Ordering/Authorizing: Wyn Quaker, DO

## 2017-03-07 NOTE — Telephone Encounter (Signed)
Pt retuning call, aware once a date and time is available he will receive a call. Please advise, thank you

## 2017-03-07 NOTE — Telephone Encounter (Signed)
I called pt Stephen [name]  L/m stating that I do not have a date or time that I can give him, once I hear when Urology will be scheduling the VUDS .

## 2017-03-07 NOTE — Telephone Encounter (Signed)
Pt calling in regards to returning [facility] phone call and stated he will try tomorrow morning to schedule the appt.    Best call back number   [phone]

## 2017-03-08 ENCOUNTER — Telehealth (HOSPITAL_BASED_OUTPATIENT_CLINIC_OR_DEPARTMENT_OTHER): Payer: Self-pay | Admitting: Urology

## 2017-03-08 NOTE — Telephone Encounter (Addendum)
Questions for Scheduling Video Urodynamic Testing:     1. Is the patient ambulatory? Yes  a. (if yes, then ask if their wheel bound or gurney) - Neither   b. How is the patient moved or transferred at home? -self  c. What is the patient's height and weight? - 5'8, weight is: 190    2. What is the patient's best way to communicate? Verbal                         Preferred language? English                                       3. Does the patient have a urethral catheter, suprapubic catheter or use intermittent catheterization? Yes - SP Tube     4. When was the last time the patient was treated for a urinary tract infection? - pt unsure when he has the last UTI. His last urine culture was completed on 3/13.     5. Does the patient have a history of?                           Seizures? No                           Stroke? No                           Multiple systems atrophy? No                          Spinal cord injury (if yes, what level?): No       Appointment: 04/20/2017 at 10:30am (Dr. Colon Flattery)

## 2017-03-08 NOTE — Telephone Encounter (Signed)
I have called pt, there's no answer. I left him a message to return my call and provided pt with my direct number.

## 2017-03-08 NOTE — Telephone Encounter (Signed)
Pt calling back to see if he can schedule the VUDS states he missed a call from [place].     Please call pt back to schedule he will also be following up to get the appt made.     Pt may be reached at [phone], thank you,

## 2017-03-08 NOTE — Telephone Encounter (Signed)
Called patient at his home no ([phone]) to inform him of the results of his urine culture. 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 He will however need antibiotic coverage for his Urodynamics.

## 2017-03-09 ENCOUNTER — Ambulatory Visit: Payer: Medicaid Other | Attending: Urology

## 2017-03-09 DIAGNOSIS — Z9359 Other cystostomy status: Principal | ICD-10-CM | POA: Insufficient documentation

## 2017-03-09 NOTE — Interdisciplinary (Signed)
Preparation:  Allergies verified with patient  Procedure explained  Patient verbalized understanding w/o objections  Patient positioned and prepped for procedure.    Procedure:  Verified the following;   Correct patient  Correct procedure  Correct side and site  Correct position    Patient presented to clinic for catheter change Per MD standing order. 65mls of water aspirated from cath balloon. Old Fr16 foley catheter removed w/0 difficulty cath tip intact.  Cleansed insertion site with betadine. Inserted foley cath Fr16 aseptically w/o any difficulty 31mls of sterile H20 instilled within cath balloon.263mL clear urine drained. Irrigated with sterile H2O. Catheter was connected to bag to drain by gravity.    Post-procedure:  Patient tolerated procedure well. 0 s/sx of any acute distress noted/reported.  Foley cath care instructions given to patient.   Verbalized good understanding of instructions given  Patient was discharged in stable condition

## 2017-03-30 ENCOUNTER — Other Ambulatory Visit: Payer: Self-pay | Admitting: Family Medicine

## 2017-03-30 ENCOUNTER — Telehealth (HOSPITAL_BASED_OUTPATIENT_CLINIC_OR_DEPARTMENT_OTHER): Payer: Self-pay | Admitting: Urology

## 2017-03-30 NOTE — Telephone Encounter (Signed)
Will give order  to Dr.Ready to sign on 03/31/17

## 2017-03-30 NOTE — Telephone Encounter (Signed)
Pt calling today asking for Dr.Redy to place order for medical supplies.  Usually ordered by pt's PCP but is asking for Dr.Reddy to order urology supplies.      [place]  Ph: [phone]  Fax: not provided  Item number  [id number]- T Drane Sponges  Item number  [id number]- Reliamed paper tape

## 2017-04-06 ENCOUNTER — Ambulatory Visit: Payer: Medicaid Other | Attending: Urology

## 2017-04-06 DIAGNOSIS — Z9359 Other cystostomy status: Principal | ICD-10-CM | POA: Insufficient documentation

## 2017-04-06 NOTE — Interdisciplinary (Signed)
Preparation:  Allergies verified with patient  Procedure explained  Patient verbalized understanding w/o objections  Patient positioned and prepped for procedure.    Procedure:  Verified the following;   Correct patient  Correct procedure  Correct side and site  Correct position    Patient presented to clinic for catheter change Per MD standing order. 62mls of water aspirated from cath balloon. Old Fr16 foley catheter removed w/0 difficulty cath tip intact.  Cleansed insertion site with betadine. Inserted foley cath Fr16 aseptically w/o any difficulty 20mls of sterile H20 instilled within cath balloon. 273mL clear urine drained. Irrigated with sterile H2O. Catheter was connected to bag to drain by gravity.    Post-procedure:  Patient tolerated procedure well. 0 s/sx of any acute distress noted/reported.  Foley cath care instructions given to patient.   Verbalized good understanding of instructions given  Patient was discharged in stable condition

## 2017-04-14 ENCOUNTER — Ambulatory Visit (HOSPITAL_BASED_OUTPATIENT_CLINIC_OR_DEPARTMENT_OTHER): Payer: Medicaid Other | Admitting: Urology

## 2017-04-17 ENCOUNTER — Telehealth (HOSPITAL_BASED_OUTPATIENT_CLINIC_OR_DEPARTMENT_OTHER): Payer: Self-pay | Admitting: Urology

## 2017-04-17 NOTE — Telephone Encounter (Signed)
Patient is calling in regards of the urodynamics procedure scheduled on 5/3 and states he was supposed to start antibiotics prior to the procedure. No record found on file, but patient will like to confirm if any preparation prior.    Preferred pharmacy:  [id number] - [place], [place]      Please advise:  [phone]

## 2017-04-17 NOTE — Telephone Encounter (Signed)
Patient left me a voicemail asking about prep prior and if he needs antibiotics for VUDS scheduled on Thursday 5/3. Will route to PA to advise.

## 2017-04-18 ENCOUNTER — Telehealth (HOSPITAL_BASED_OUTPATIENT_CLINIC_OR_DEPARTMENT_OTHER): Payer: Self-pay | Admitting: Urology

## 2017-04-18 NOTE — Telephone Encounter (Signed)
Patient has an appt with Dr. Reece Levy 04/28/17.  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  Pt is requesting if clinic can contact the [place] to have a rep join Doctor and himself at his 04/28/17 appt.  Pt said this will determine if the implant needs to stay in or come out.  Please contact patient if needed at [phone]    [employer]    Ph:[phone]

## 2017-04-18 NOTE — Telephone Encounter (Signed)
LVM to call back.

## 2017-04-18 NOTE — Telephone Encounter (Signed)
I contacted patient there's no answer. I left a detailed message to inform patient there's no prep and he doesn't need antibiotic unless he is having symptoms. Provide urology phone number for pt to return call if he has any other questions.    Mahala Menghini, PA  You 16 hours ago (4:22 PM  Unless having symptoms does not need culture or abx (Routing comment)

## 2017-04-20 ENCOUNTER — Encounter (HOSPITAL_BASED_OUTPATIENT_CLINIC_OR_DEPARTMENT_OTHER): Payer: Self-pay | Admitting: Urology

## 2017-04-20 ENCOUNTER — Ambulatory Visit: Payer: Medicaid Other | Attending: Surgical | Admitting: Urology

## 2017-04-20 VITALS — BP 135/68 | HR 55

## 2017-04-20 DIAGNOSIS — N359 Urethral stricture, unspecified: Secondary | ICD-10-CM | POA: Insufficient documentation

## 2017-04-20 DIAGNOSIS — Z9359 Other cystostomy status: Secondary | ICD-10-CM | POA: Insufficient documentation

## 2017-04-20 DIAGNOSIS — R339 Retention of urine, unspecified: Principal | ICD-10-CM | POA: Insufficient documentation

## 2017-04-20 MED ORDER — GENTAMICIN SULFATE 40 MG/ML IJ SOLN
80.0000 mg | Freq: Once | INTRAMUSCULAR | Status: AC
Start: 2017-04-20 — End: 2017-04-20
  Administered 2017-04-20: 80 mg via INTRAMUSCULAR

## 2017-04-20 NOTE — Progress Notes (Signed)
VIDEOURODYNAMICS NURSING NOTE    Chief Complaint:   Chief Complaint   Patient presents with    Other     Chronic suprapubic catheter, VUDS       66 year old patient presents for videourodynamics     he did not have UTI symptoms    Procedure:  Patient arrived to clinic via ambulatory, alert and oriented x 3. Discussed with the patient testing plan and purpose, discussed possible complications due to testing, possible irritation, bleeding and infection regarding catheterization, signs/symptoms of infection and when to call physician. Pt verbalized understanding of the plan of care. Time out taken, verified patient's date of birth and name.  Vitals: BP 135/68 (BP Location: Right arm, BP Patient Position: Sitting, BP cuff size: Regular)   Pulse 55    Antibiotics given as Post procedure IM Injection: Gentamicin 80mg     Physician present during flouroscopy portion of the procedure.    Assessment:  Patient tolerated urodynamics without problems as ordered by Dr. Reece Levy.    Plan:  Pt to follow up with Dr. Reece Levy.

## 2017-04-20 NOTE — Progress Notes (Signed)
Date of Encounter: 04/20/2017      Indications for Procedure:  Patient is a 66 year old male who is referred by Dr. Reece Levy. He is scheduled for videourodynamic testing for the following indication:     ICD-10-CM ICD-9-CM   1. Urinary retention R33.9 788.20   2. Urethral stricture, unspecified stricture type N35.9 598.9   3. Suprapubic catheter (CMS-HCC) Z93.59 V44.59     Evaluation: The patient was evaluated using the [place] urodynamic equipment. A non-invasive uroflow study was not attempted. Patient presented with indwelling catheter.  He was catheterized for a residual of 0 mL.      A multichannel filling cystometry was performed. His urethra was then catheterized with a double lumen 7 French catheter in the bladder and a balloon catheter was placed in the rectum.  EMG surface electrodes were placed on the perineum. The study was conducted in the supine position. Baseline vesical pressure was 14 cm H2O,  the baseline abdominal pressure was 13 and the baseline detrusor pressure was 1. The bladder was filled at 30 mL/min. First desire was at 202 ml and maximum cystometric capacity was at 533mL determined by patient feeling desire to catheterize due to feeling of increasing pressure. Valsalva and cough maneuvers were performed beginning at a volume of 200 mL and then every 100 mL until [facility].Urodynamic stress incontinence was not demonstrated.  During filling the bladder demonstrated no detrusor overactivity although he did experience a small rise in vesical pressure to 26 cm H20 at 350cc of filling associated with a leak.  Bladder compliance was normal.    The patient remained supine for a pressure flow study. He initially felt the urge to void at 350cc.  At this time baseline vesical pressure was 28 cm H2O and abdominal pressure was 9 cm H2O with a baseline detrusor pressure of 18 cm H2O.  Patient voided volume of 20 ml.  Filling was restarted once his urge subsided and he was filled to his [facility] of 513mL.  He tried  to void twice more but was unable to initiate a void.  Pressure flow voiding mechanism was predominately valsalva. The post void residual obtained by catheterization was 500 mL.    Perineal surface patch EMG recordings were used during the study. They were synergistic .    Potassium testing was not performed.    Flouroscopy:  Images were obtained during storage and emptying.    Storage:   Closed bladder neck at rest and with strain, normal position of bladder in pelvis, no diverticuli however pt with some dilation of bladder on the right and posteriorly, no vesicoureteral reflux.    Emptying:  Patient voided 20 mLs, no BN funneling, PVR large, could not initiate a true void    After the study, a retrograde urethrogram was performed.  The meatus and very distal pendulous urethra were not well visualized.  The urethra appeared of normal caliber without evidence of stricture to ~1 inch below the symphysis, likely the EUS.    Impression:  Normal storage phase  Unable to assess voiding phase, no voluntary detrusor contraction noted.   No evidence of anterior urethral stricture on retrograde imaging    Of note, patient states his Interstim device has been off for many years but this was not confirmed before the study.    Plan:  Follow up with Dr. Reece Levy for review of urodynamics and further management.

## 2017-04-20 NOTE — Patient Instructions (Signed)
Patient Instructions - Urodynamics Aftercare    After your Urodynamics procedure you will be able to return to your normal activities.  You may have some burning or irritation when you urinate for the next 12-24 hours but this should resolve.      If you haven't already, please make an appointment with your physician, PA or NP to discuss the results of this test.  Please allow one week for the test to be read before your appointment.    Please call your doctor, NP or PA at [phone] if you have any of the following symptoms:    - burning/pain that lasts longer than 24 hours  - fever >100.4

## 2017-04-23 LAB — URINE CULTURE

## 2017-04-24 NOTE — Progress Notes (Addendum)
Received one dose of Cipro at time of VUD.  This won't cover the ESBL, however this represents colonization and if he remains asymptomatic he should not require treatment.    I reviewed the history and spoke with the patient .  I have examined the patient, was present for the entire study and I concur with the PA interpretation of the videourodynamic study.

## 2017-04-28 ENCOUNTER — Ambulatory Visit: Payer: Medicaid Other | Attending: Urology | Admitting: Urology

## 2017-04-28 VITALS — BP 98/73 | HR 60 | Temp 97.7°F | Resp 14 | Ht 68.0 in | Wt 199.0 lb

## 2017-04-28 DIAGNOSIS — N320 Bladder-neck obstruction: Secondary | ICD-10-CM | POA: Insufficient documentation

## 2017-04-28 DIAGNOSIS — R109 Unspecified abdominal pain: Secondary | ICD-10-CM | POA: Insufficient documentation

## 2017-04-28 NOTE — Patient Instructions (Signed)
Please follow up with Dr. Lucia Bitter regarding workup and treatment for possible bladder neck contracture    I will call [employer] and asked them to evaluate your Inters tim to see if its working.

## 2017-05-01 NOTE — Progress Notes (Signed)
@APPTPROVNAME @  @APPTPROVADDR @    Reason for visit: [place]  Demographics:        Date: 04/28/2017       Patient Name: Stephen [name]       Medical Record #: [id number]       DOB: February 22, 1951       Age: 66 year old       Sex: male    Wyn Quaker, DO  Department of Urology 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 He reports that his SPT changes in the clinic have been going well. He denies fevers, chills, suprapubic or flank pain. He was not able to obtain any further medical records from his previous urologists. When asked about his general health, he reports that his arthritis has been worsening. He is interested in getting his [place] repaired so that he can void on his own again. He thinks that his bladder was "under active" at some point however he never had UDS. Regarding his sporadic care with multiple urologists, he reports that he was homeless at one point and is "trying to get his life together" now.     HPI 95/6213: 10M with complicated urologic history as listed below presents for SPT change. History was difficult to obtain since he is flustered and tangential with his story. 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 In 2015, he was found to have significant hematuria with clots and thinks that his urologist told him that his "bladder filled up with so much blood that it stretched and stopped working". He reports that when the urologist performed a cysto and clot evacuation, he might have had a bladder perforation. He did not need any surgery to repair this perforation. After the hematuria episode, he needed to start CIC. He reports that a few months into it, he was found to have a urethral stricture. He thinks that the stricture was caused by all the  cystoscopies that he needed to undergo. He was then switched to a supra pubic tube. He reports that no one is routinely changing the SPT's. He just goes to the ER as needed for cathter changes and since they are reluctant to do these changes, they asked him to follow up with a urologist and hence he is here. Of note, he reports that he had an interstim placement for this "bladder stretching even though the rep said that it will not be fixed by interstim". But his records show that it was placed in 2012. Surgical time line per his written down records are as follows:    Lithotripsy 2005, 2006, 2007  Interstim 2012  SPT placement10/06/2015  Last SPT change 08/23/2016 in the ER    History per medical records from [facility] a few hours after his visit is as follows:  Cysto SPT 09/25/2015: Mild BNC  ? Prostate surgery in the past      Past Medical History:   Diagnosis Date    Chronic suprapubic catheter (CMS-HCC)     Congenital hydronephrosis     Gout     Hematuria     Kidney stones     Major depressive disorder, single episode     Urethral stricture  Past Surgical History:   Procedure Laterality Date    CT INSERTION OF SUPRAPUBIC CATH  09/25/2015    NEPHRECTOMY Right 1995    OTHER SURGICAL HISTORY      Interstim 01/29/2011       Allergies   Allergen Reactions    Sulfa Drugs Unspecified       Current Outpatient Prescriptions   Medication Sig Dispense Refill    allopurinol (ZYLOPRIM) 300 MG tablet Take 300 mg by mouth daily.      amLODIPINE (NORVASC) 5 MG tablet Take 10 mg by mouth daily.      atenolol (TENORMIN) 25 MG tablet Take 25 mg by mouth daily.      ibuprofen (MOTRIN) 200 MG tablet Take 200 mg by mouth every 6 hours as needed for Mild Pain (Pain Score 1-3).      meloxicam (MOBIC) 7.5 MG tablet        tizanidine (ZANAFLEX) 4 MG capsule Take 4 mg by mouth 3 times daily.       No current facility-administered medications for this visit.        Social History     Social History      Marital status: Single     Spouse name: N/A    Number of children: N/A    Years of education: N/A     Occupational History    Not on file.     Social History Main Topics    Smoking status: Never Smoker    Smokeless tobacco: Never Used    Alcohol use Not on file    Drug use: Not on file    Sexual activity: No     Other Topics Concern    Not on file     Social History Narrative       REVIEW OF SYSTEMS    Constitutional: denies fatigue, night sweats, weight loss, anorexia, fever.  Eyes: denies:  blurry vision, double vision.  Ears, Nose, Mouth, Throat: denies  sore throat, hearing loss.  CV: denies  palpitations, syncope, chest pain, orthopnea.  Resp: denies cough, shortness of breath.  GI: denies vomiting, nausea, abdominal pain, constipation, diarrhea.  GU: Please see HPI.  Musculoskeletal: denies joint pain, muscle weakness.  Integumentary: denies rash, itching.  Neuro: denies headaches, numbness or tingling.  Psych: denies depressed mood.    Physical Exam:   Vitals:    04/28/17 0955   BP: 98/73   BP Location: Left arm   BP Patient Position: Sitting   BP cuff size: Regular   Pulse: 60   Resp: 14   Temp: 97.7 F (36.5 C)   TempSrc: Temporal Artery   SpO2: 99%   Weight: 90.3 kg (199 lb)   Height: 5\' 8"  (1.727 m)     GENERAL APPEARANCE: The patient is an alert, cooperative male in no acute distress.   ENT: Hearing and vision intact  LYMPH NODES: No palpable supra clavicular nodes.   CHEST: Non labored respirations.  ABDOMEN: Soft, Non tender and non distended. SPT with clear urine in the bag  BACK: No CVA ttp   EXTREMITES: There is no edema or cyanosis.   SKIN: No rash    Labs/Diagnostic X-rays:  No results found for: WBC, RBC, HGB, HCT, MCV, MCHC, RDW, PLT, MPV, SEGS, LYMPHS, MONOS, EOS, BASOS    Lab Results   Component Value Date    BUN 22 02/28/2017    CREAT 1.16 02/28/2017    CL 100 02/28/2017    NA 140  02/28/2017    K 4.4 02/28/2017    [place] 9.3 02/28/2017    BICARB 25 02/28/2017    GLU 108 (H)  02/28/2017     VUDS w/ RUG (04/20/17):   Flouroscopy:  Images were obtained during storage and emptying.    Storage:   Closed bladder neck at rest and with strain, normal position of bladder in pelvis, no diverticuli however pt with some dilation of bladder on the right and posteriorly, no vesicoureteral reflux.    Emptying:  Patient voided 20 mLs, no BN funneling, PVR large, could not initiate a true void    After the study, a retrograde urethrogram was performed.  The meatus and very distal pendulous urethra were not well visualized.  The urethra appeared of normal caliber without evidence of stricture to ~1 inch below the symphysis, likely the EUS.    Impression:  Normal storage phase  Unable to assess voiding phase, no voluntary detrusor contraction noted.   No evidence of anterior urethral stricture on retrograde imaging    Of note, patient states his Interstim device has been off for many years but this was not confirmed before the study      RUS (02/14/17): IMPRESSION:  Mild left hydronephrosis. No renal stones.  S/p right nephrectomy with compensatory hypertrophy of the left kidney  Suprapubic Foley catheter.    CT A/P 09/14/2016: Right kidney surgically absent. Mild left renal pelviectasis and perinephric stranding. Bladder decompressed with a foley. No bladder calculi    PSA 02/03/16: 0.39    UCx 04/10/15: ESBL sens to Ertapenem, Gent, Macrobid, Zosyn, Bactrim    Assessment/Plan:   32Z with complicated history of  1) R nephrectomy as a child   2) Mild left hydro in solitary kidney with cr of 1.16  3) Interstim placement for ?voiding dysfunction. Not functional at present per patient.  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 Prostate surgery    Interested in getting off SPT if possible    Plan:   - UDS shows good storage phase however he was not able to initiate a void. PVR 500 ccs  - RUG showed no anterior urethra strictures  - VUDS report shows that bladder  neck was closed with straining. I wonder if this is secondary to the [facility]  - VUDS also didn't show VUR. His mild hydro may be physiologic since there was no obstruction on CT done on 09/14/16 and he is asymptomatic  - He will likely need a cysto to evaluate his bladder neck  - I will refer him to Dr. Lucia Bitter and leave the cysto up to her since she will perform any surgical intervention if needed.   - He wants the [employer] rep to interrogate his Interstim but they wont show up unless a physician orders it. I will call the [employer] service phone line to help co-ordinate a reps visit to the time he meets with Dr. Lucia Bitter.     Thank you for allowing me to participate in this patient's care.   Sincerely,   Wyn Quaker, DO

## 2017-05-04 ENCOUNTER — Ambulatory Visit: Payer: Medicaid Other | Attending: Urology

## 2017-05-04 DIAGNOSIS — N200 Calculus of kidney: Principal | ICD-10-CM

## 2017-05-08 ENCOUNTER — Telehealth (HOSPITAL_BASED_OUTPATIENT_CLINIC_OR_DEPARTMENT_OTHER): Payer: Self-pay | Admitting: Urology

## 2017-05-08 NOTE — Telephone Encounter (Signed)
Patient would like to schedule a follow up with Dr. Reece Levy.  Her schedule is not open, please contact patient at [phone] once her schedule is up.  Thank you

## 2017-05-09 ENCOUNTER — Ambulatory Visit
Admission: RE | Admit: 2017-05-09 | Discharge: 2017-05-09 | Disposition: A | Discharge status: Home / Self Care | Payer: Medicaid Other | Attending: Body Imaging | Admitting: Body Imaging

## 2017-05-09 DIAGNOSIS — N1330 Unspecified hydronephrosis: Principal | ICD-10-CM | POA: Insufficient documentation

## 2017-05-09 DIAGNOSIS — R109 Unspecified abdominal pain: Secondary | ICD-10-CM

## 2017-05-12 ENCOUNTER — Ambulatory Visit: Payer: Medicaid Other | Attending: Urology | Admitting: Urology

## 2017-05-12 ENCOUNTER — Encounter (HOSPITAL_BASED_OUTPATIENT_CLINIC_OR_DEPARTMENT_OTHER): Payer: Self-pay | Admitting: Urology

## 2017-05-12 VITALS — BP 125/67 | HR 52 | Temp 98.0°F | Resp 14 | Ht 68.0 in | Wt 199.0 lb

## 2017-05-12 DIAGNOSIS — N320 Bladder-neck obstruction: Secondary | ICD-10-CM | POA: Insufficient documentation

## 2017-05-12 DIAGNOSIS — N35013 Post-traumatic anterior urethral stricture: Principal | ICD-10-CM | POA: Insufficient documentation

## 2017-05-12 MED ORDER — LIDOCAINE HCL 2% EX GEL (UROJET)
10.0000 mL | Freq: Once | Status: AC
Start: 2017-05-12 — End: 2017-05-12
  Administered 2017-05-12: 10 mL via URETHRAL

## 2017-05-12 MED ORDER — ADVIL PM PO
ORAL | Status: DC | PRN
Start: ? — End: 2019-08-23

## 2017-05-12 NOTE — Interdisciplinary (Signed)
Patient prepped using sterile technique, Lidocaine Hydrochloride Jelly 1% (Urojet) inserted via urethra.   Pt tolerated well.  Pt left in satisfactory condition.    Urojet  Lot # [id number]  Exp 12/19    In room 1145  Time out 11:57  Procedure start 11:58  Procedure finish 12:00  Out room 1225

## 2017-05-12 NOTE — Progress Notes (Signed)
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     Patient has been seen by Dr. Reece Levy in clinic, had concerns for bladder dysfunction, therefore VUDS was completed     UDS shows good storage phase however he was not able to initiate a void. PVR 500 ccs,  - RUG showed no anterior urethra strictures  - VUDS report shows that bladder neck was closed with straining. I wonder if this is secondary to the [facility]  - VUDS also didn't show VUR. His mild hydro may be physiologic since there was no obstruction on CT done on 09/14/16 and he is asymptomatic.      He has had numerous surgeries     Lithotripsy 2005, 2006, 2007  Interstim 2012- Has not been working- Patient concerned with left thigh pain, thinks associated with SNS  SPT placement10/06/2015  Last SPT change 08/23/2016 in the ER    History per medical records from [facility] a few hours after his visit is as follows:    Cysto SPT 09/25/2015: Mild BNC  ? Prostate surgery in the past    Currently denies The patient denies gross hematuria, dysuria, fevers, chills, night sweats, abdominal pain, flank pain, pelvic pain, nausea, vomiting, diarrhea, chest pain, shortness of breath, feeling light-headed or dizzy, unintentional weight loss, bone pain, or changes in lower extremity sensation.         Past Medical History:   Diagnosis Date    Chronic suprapubic catheter (CMS-HCC)     Congenital hydronephrosis     Gout     Headache     Hematuria     Kidney disease     Kidney stones     Major depressive disorder, single episode     Polyarthropathy or polyarthritis of multiple sites     Urethral stricture        Past Surgical History:   Procedure Laterality Date    APPENDECTOMY      CT INSERTION OF SUPRAPUBIC  CATH  09/25/2015    CYSTOSCOPY      CYSTOSCOPY W/ LASER LITHOTRIPSY      NEPHRECTOMY Right 1995    OTHER SURGICAL HISTORY      Interstim 01/29/2011       Allergies   Allergen Reactions    Sulfa Drugs Unspecified       Current Outpatient Prescriptions   Medication Sig Dispense Refill    allopurinol (ZYLOPRIM) 300 MG tablet Take 300 mg by mouth daily.      amLODIPINE (NORVASC) 5 MG tablet Take 10 mg by mouth daily.      atenolol (TENORMIN) 25 MG tablet Take 25 mg by mouth daily.      ibuprofen (MOTRIN) 200 MG tablet Take 200 mg by mouth every 6 hours as needed for Mild Pain (Pain Score 1-3).      Ibuprofen-Diphenhydramine Cit (ADVIL PM PO) as needed (1 qhs).      meloxicam (MOBIC) 7.5 MG tablet        tizanidine (ZANAFLEX) 4 MG capsule Take 4 mg by mouth 3 times daily.       No current facility-administered medications for this visit.        Social History  Social History    Marital status: Single     Spouse name: N/A    Number of children: N/A    Years of education: N/A     Occupational History    Not on file.     Social History Main Topics    Smoking status: Never Smoker    Smokeless tobacco: Never Used    Alcohol use Not on file    Drug use: Not on file    Sexual activity: No     Other Topics Concern    Not on file     Social History Narrative       REVIEW OF SYSTEMS    Constitutional: denies fatigue, night sweats, weight loss, anorexia, fever.  Eyes: denies:  blurry vision, double vision.  Ears, Nose, Mouth, Throat: denies  sore throat, hearing loss.  CV: denies  palpitations, syncope, chest pain, orthopnea.  Resp: denies cough, shortness of breath.  GI: denies vomiting, nausea, abdominal pain, constipation, diarrhea.  GU: Please see HPI.  Musculoskeletal: denies joint pain, muscle weakness.  Integumentary: denies rash, itching.  Neuro: denies headaches, numbness or tingling.  Psych: denies depressed mood.    Physical Exam:   Vitals:    05/12/17 1000   BP: 125/67   BP Location: Left arm      BP Patient Position: Sitting   BP cuff size: Regular   Pulse: 52   Resp: 14   Temp: 98 F (36.7 C)   TempSrc: Oral   SpO2: 96%   Weight: 90.3 kg (199 lb)   Height: 5\' 8"  (1.727 m)     GENERAL APPEARANCE: The patient is an alert, cooperative male in no acute distress.   ENT: Hearing and vision intact  LYMPH NODES: No palpable supra clavicular nodes.   CHEST: Non labored respirations.  ABDOMEN: Soft, Non tender and non distended. SPT with clear urine in the bag  BACK: No CVA ttp   EXTREMITES: There is no edema or cyanosis.   SKIN: No rash    Labs/Diagnostic X-rays:  No results found for: WBC, RBC, HGB, HCT, MCV, MCHC, RDW, PLT, MPV, SEGS, LYMPHS, MONOS, EOS, BASOS    Lab Results   Component Value Date    BUN 22 02/28/2017    CREAT 1.16 02/28/2017    CL 100 02/28/2017    NA 140 02/28/2017    K 4.4 02/28/2017    [place] 9.3 02/28/2017    BICARB 25 02/28/2017    GLU 108 (H) 02/28/2017     VUDS w/ RUG (04/20/17):   Flouroscopy:  Images were obtained during storage and emptying.    Storage:   Closed bladder neck at rest and with strain, normal position of bladder in pelvis, no diverticuli however pt with some dilation of bladder on the right and posteriorly, no vesicoureteral reflux.    Emptying:  Patient voided 20 mLs, no BN funneling, PVR large, could not initiate a true void    After the study, a retrograde urethrogram was performed.  The meatus and very distal pendulous urethra were not well visualized.  The urethra appeared of normal caliber without evidence of stricture to ~1 inch below the symphysis, likely the EUS.      Cystoscopy 05/12/17:    Consent was obtained for an office cystoscopy. A time out was performed to confirm the MRN and procedure. The patient was prepped and draped in the usual sterile fashion. 2% lidocaine gel was inserted into the urethra. A 17 fr flexible cystoscope  was inserted into the meatus.     Findings:  Meatus-normal, no sign of LS  Penile urethra- normal  Bulbar urethra- normal  Membranous  urethra-normal. EUS looks intake  Prostatic urethra-Obliterated urethra, possible pinpoint lumen, smaller than a wire. Soft appearing    Patient tolerated the procedure without difficulty.      Impression:  Normal storage phase  Unable to assess voiding phase, no voluntary detrusor contraction noted.   No evidence of anterior urethral stricture on retrograde imaging    Of note, patient states his Interstim device has been off for many years but this was not confirmed before the study      RUS (02/14/17): IMPRESSION:  Mild left hydronephrosis. No renal stones.  S/p right nephrectomy with compensatory hypertrophy of the left kidney  Suprapubic Foley catheter.    CT A/P 09/14/2016: Right kidney surgically absent. Mild left renal pelviectasis and perinephric stranding. Bladder decompressed with a foley. No bladder calculi    PSA 02/03/16: 0.39    UCx 04/10/15: ESBL sens to Ertapenem, Gent, Macrobid, Zosyn, Bactrim    Assessment/Plan:     Patient has a complex urologic hx with numerous issues, but main issues are he is SPT depending, poor functioning bladder, BNC with obliterated urethra. Patient has a SNS, not working at this time.     On cystoscopy today, patient had obliterated urethra, but looked proximal to EUS.  We discussed in detail with the patient that we will need to open his obstruction first prior having SNS interrogation. We do no know if he will be able to urinate on his own, but after opening up his bladder neck, may be incontinent.     The risks, benefits and alternatives of the planned procedure have been discussed with the patient and/or her legal representative, all questions have been answered and they agree to proceed. The risks discussed include, but are not limited to the following:   -- The risk of bleeding requiring a blood transfusion. The risk of infection with the HIV virus (1 in 1-2 million, Hepatitis virus (1/500,000), and severe allergic reaction (1/50000) has been reviewed.   -- The  risks of infection requiring antibiotics, a prolonged hospital course, and possible reoperation have been reviewed.   -- Risks of wound complications, prolonged hospital stay  -- The risk of damage to adjacent organs ( bladder, ureters, nerves, and vessels) requiring further surgical intervention has been reviewed.   -- The risk of pain requiring IV analgesics has been reviewed.   -- The risk of medical complications including, but not limited to DVT, MI, CVA, and death has been reviewed.   -- The risk of failed procedure or need for repeat procedure      -DVIU with [facility]  UCx    - He wants the [employer] rep to interrogate his Interstim but they wont show up unless a physician orders it. I will call the [employer] service phone line to help co-ordinate a reps visit to the time he meets with Dr. Lucia Bitter.     ATTENDING NOTE:  I saw the patient with the resident whose note details the consultation/visit.  I performed key points of the physical exam and was present for the entire procedure. I agree with the assessment and recommendations described above. We discussed he may not be able to urinate even if we relieve the scar tissue. He is hopefully if we open the scar tissue, he can have his SNS revised and will be able to void via his  urethra. I discussed this may or may not happen. He understands the first step is opening the scar tissue.    Sharyne Peach Lucia Bitter, MD FACS

## 2017-05-15 ENCOUNTER — Encounter (INDEPENDENT_AMBULATORY_CARE_PROVIDER_SITE_OTHER): Payer: Medicaid Other | Admitting: Dermatology

## 2017-05-15 DIAGNOSIS — C44222 Squamous cell carcinoma of skin of right ear and external auricular canal: Secondary | ICD-10-CM

## 2017-05-16 ENCOUNTER — Telehealth (HOSPITAL_BASED_OUTPATIENT_CLINIC_OR_DEPARTMENT_OTHER): Payer: Self-pay | Admitting: Urology

## 2017-05-16 NOTE — Telephone Encounter (Addendum)
Patient was seen 05/12/17 and forgot to give the information of his last Urologist he had seen in the past.  Urologist name is Dr. Rollen Sox Roc with a ph: [phone].    Patient can be reached at [phone] if clinic may have any questions for him. Thank you

## 2017-05-18 ENCOUNTER — Telehealth (HOSPITAL_BASED_OUTPATIENT_CLINIC_OR_DEPARTMENT_OTHER): Payer: Self-pay

## 2017-05-18 DIAGNOSIS — N390 Urinary tract infection, site not specified: Principal | ICD-10-CM

## 2017-05-18 LAB — URINE CULTURE

## 2017-05-18 MED ORDER — FOSFOMYCIN TROMETHAMINE 3 GM OR PACK
3.00 g | PACK | Freq: Once | ORAL | 0 refills | Status: AC
Start: 2017-05-18 — End: 2017-05-18

## 2017-05-18 NOTE — Telephone Encounter (Signed)
Pt calling this morning asking to speak with [facility] again regarding medication prescribed.

## 2017-05-19 ENCOUNTER — Encounter (INDEPENDENT_AMBULATORY_CARE_PROVIDER_SITE_OTHER): Payer: Self-pay

## 2017-05-19 NOTE — Telephone Encounter (Signed)
Spoke to pt who states insurance will not cover fosfomycin ($110.00/dose). Sample was taken from SPT & pt is asymptomatic. When pt's surgery is scheduled, will obtain additional UCX.

## 2017-05-23 ENCOUNTER — Telehealth (HOSPITAL_BASED_OUTPATIENT_CLINIC_OR_DEPARTMENT_OTHER): Payer: Self-pay | Admitting: Urology

## 2017-05-23 DIAGNOSIS — N35013 Post-traumatic anterior urethral stricture: Principal | ICD-10-CM

## 2017-05-23 NOTE — Telephone Encounter (Signed)
L/M [phone] calling to schedule surgery (cysto w/DVIU and mito c injection) per Dr. Lucia Bitter epic order.  Left my direct number for call back.

## 2017-05-24 NOTE — Telephone Encounter (Signed)
Pt is calling requesting to speak with nurse, Haynes Dage in regards to message below and wants to know if he can get this approved somehow or a generic can be prescribed? Pt states he does not want to schedule the surgery without having the medication and wants to get this cleared as soon as possible. Please review and advise. Return call back to pt at [phone] to further assist.

## 2017-05-24 NOTE — Telephone Encounter (Signed)
Pt is calling back returning Ramona's call to schedule procedure appt. Please return call back to pt at [phone] .

## 2017-05-29 ENCOUNTER — Encounter (HOSPITAL_BASED_OUTPATIENT_CLINIC_OR_DEPARTMENT_OTHER): Payer: Self-pay

## 2017-05-29 DIAGNOSIS — N35012 Post-traumatic membranous urethral stricture: Principal | ICD-10-CM

## 2017-06-01 ENCOUNTER — Other Ambulatory Visit
Admit: 2017-06-01 | Discharge: 2017-06-01 | Disposition: A | Discharge status: Home / Self Care | Payer: Medicaid Other | Attending: Urology | Admitting: Urology

## 2017-06-01 ENCOUNTER — Ambulatory Visit: Payer: Medicaid Other | Attending: Urology

## 2017-06-01 DIAGNOSIS — N35013 Post-traumatic anterior urethral stricture: Principal | ICD-10-CM | POA: Insufficient documentation

## 2017-06-01 NOTE — Interdisciplinary (Signed)
Catheter Change:    Patient presents to clinic for routine catheter change, per standing order from MD. Patientambulatory to clinic and in no acute distress. No new sx or concerns reported.    Preparation:  Allergies verified with patient  Procedure explained  Patient verbalized understanding w/o objections  Patient positioned and prepped for procedure    Procedure:   16Fr indwelling catheter discontinued with no difficulty after fully deflating cath balloon - 10 mls of water aspirated from cath balloon. Patient tolerated well with minimal discomfort. Under sterile technique patient prepped with betadine. 16 Fr indwelling catheter inserted into bladder with no difficulty. 10 mls of sterile water instilled within cath balloon. Bladder irrigated with 10 ml sterile water. Clear, yellow urine drained.   Patient tolerated procedure well with no discomfort. Connected catheter to bag to drain by gravity. Patient stable at discharge.

## 2017-06-02 NOTE — Telephone Encounter (Signed)
I returned call patient to schedule surgery (Cysto w/DVIU) per Dr. Lucia Bitter epic order.        Per patient can not schedule surgery at this time because he has not started antibiotic Fosfomycin prescribed by Dr. Lucia Bitter due to medication requiring a PAR.    I do not see under medication orders or patient notes Fosfomycin was prescribed by Dr. Lucia Bitter.  Routing to Dr. Lucia Bitter and nurse Haynes Dage RN note to clarify.      If Fosfomycin was prescribed and requires PAR please route to nurse Delbert Phenix to submit urgent PAR.  Routing msg to Dr. Lucia Bitter, Haynes Dage RN and Carlos American LVN

## 2017-06-02 NOTE — Telephone Encounter (Incomplete)
Pt's UCX was repeated yesterday

## 2017-06-05 LAB — URINE CULTURE

## 2017-06-06 ENCOUNTER — Telehealth (HOSPITAL_BASED_OUTPATIENT_CLINIC_OR_DEPARTMENT_OTHER): Payer: Self-pay | Admitting: Urology

## 2017-06-06 NOTE — Telephone Encounter (Signed)
Routing results to provider   06/05/2017 12:01 PM - Electronic Interface To Epic, [facility] Lab Results   Component Results   Component Value Lab   Urine Culture (Abnormal) ESBL Escherichia coli   >100,000 colonies/mL CALM     Susceptibility    ESBL Escherichia coli     Not Specified     Amikacin <=8 mcg/mL Susceptible 1     Ampicillin >16 mcg/mL Resistant 2     Ampicillin/Sulbactam 16/8 mcg/mL Resistant 2     Cefazolin >16 mcg/mL Resistant 3     Cefepime 4 mcg/mL Resistant 3     Cefoxitin <=4 mcg/mL Resistant 3     Ceftazidime 4 mcg/mL Resistant 3     Ceftriaxone >32 mcg/mL Resistant 3     Ciprofloxacin >2 mcg/mL Resistant 3     Ertapenem <=0.25 mcg/mL Susceptible 3     Fosfomycin 2.0 mcg/mL Susceptible 1     Gentamicin <=2 mcg/mL Susceptible 1     Meropenem <=0.5 mcg/mL Susceptible 3     Nitrofurantoin <=16 mcg/mL Susceptible 4     Piperacillin/Tazobactam 8/4 mcg/mL Resistant 2     Tobramycin >8 mcg/mL Resistant 1     Trimethoprim/Sulfamethoxazole <=0.5/9.5 m... Susceptible 5

## 2017-06-07 ENCOUNTER — Telehealth (HOSPITAL_BASED_OUTPATIENT_CLINIC_OR_DEPARTMENT_OTHER): Payer: Self-pay | Admitting: Urology

## 2017-06-07 NOTE — Telephone Encounter (Signed)
[facility] is calling to request a status update on a PA for Monurol.  [facility] ph [phone]

## 2017-06-09 MED ORDER — FOSFOMYCIN TROMETHAMINE 3 GM OR PACK
3.0000 g | PACK | Freq: Every day | ORAL | 0 refills | Status: AC
Start: 2017-06-09 — End: 2017-06-12

## 2017-06-09 NOTE — Telephone Encounter (Signed)
Message forwarded to [facility] to submit for prior auth for fosfomycin.

## 2017-06-09 NOTE — Telephone Encounter (Addendum)
Faxed received, routing to clinical team for assistance.

## 2017-06-12 ENCOUNTER — Telehealth (HOSPITAL_BASED_OUTPATIENT_CLINIC_OR_DEPARTMENT_OTHER): Payer: Self-pay

## 2017-06-12 NOTE — Telephone Encounter (Signed)
Request for PA for fosfomycin forwarded to [facility].

## 2017-06-12 NOTE — Telephone Encounter (Signed)
Prior Authorization for fosfomycin (MONUROL) 3 g PACK  filled out and faxed, waiting for determination.

## 2017-06-16 ENCOUNTER — Telehealth (HOSPITAL_BASED_OUTPATIENT_CLINIC_OR_DEPARTMENT_OTHER): Payer: Self-pay | Admitting: Urology

## 2017-06-16 DIAGNOSIS — N390 Urinary tract infection, site not specified: Principal | ICD-10-CM

## 2017-06-16 NOTE — Telephone Encounter (Signed)
Pt calling regarding Rx fosfomycin (MONUROL) 3 g PACK , states he received a denial from insurance. Letter states this medication is not approved by the FDA and will require 2 healthcare studies that show this will help to treat pt. Pt is requesting to discuss this matter with Haynes Dage. Please contact pt

## 2017-06-23 ENCOUNTER — Telehealth (HOSPITAL_BASED_OUTPATIENT_CLINIC_OR_DEPARTMENT_OTHER): Payer: Self-pay | Admitting: Urology

## 2017-06-23 NOTE — Telephone Encounter (Signed)
Encounter opened in error

## 2017-06-27 NOTE — Telephone Encounter (Signed)
Spoke to pt. Pt stated that he received a letter of denial for his medication. Informed pt that sometimes this drug is not covered by his insurance and an appeal can be submitted and a message will be sent to Dr Lucia Bitter and Haynes Dage to advise if there is an alternative. Pt is going to fax over information received from insurance tomorrow and see if an appeal can happen.    Routing to [employer]

## 2017-06-27 NOTE — Telephone Encounter (Signed)
Pt calling today asking to speak with [facility] to discuss medication, pt states it is not covered by insurance.  Pt asking if there is another medication can be prescribed?

## 2017-06-29 ENCOUNTER — Ambulatory Visit: Payer: Medicaid Other | Attending: Urology

## 2017-06-29 ENCOUNTER — Telehealth (HOSPITAL_BASED_OUTPATIENT_CLINIC_OR_DEPARTMENT_OTHER): Payer: Self-pay | Admitting: Urology

## 2017-06-29 DIAGNOSIS — N35013 Post-traumatic anterior urethral stricture: Principal | ICD-10-CM

## 2017-06-29 NOTE — Telephone Encounter (Signed)
Patient is a previous patient of Dr.Reddy who is no longer in our clinic and has referred patient to [place] at [facility]. Patient presented to clinic for cath change, and current order has expired.     Potential new order pended, routing to MD for signature.   Patients last Office Visit:05/12/17

## 2017-06-29 NOTE — Interdisciplinary (Signed)
Catheter Change:  Patient presents to clinic for routine catheter change, per standing order from MD. Patientambulatory to clinic and in no acute distress. No new sx or concerns reported.    Preparation:  Allergies verified with patient  Procedure explained  Patient verbalized understanding w/o objections  Patient positioned and prepped for procedure    Procedure:  16 Fr indwelling catheter discontinued with no difficulty after fully deflating cath balloon - 10 mls of water aspirated from cath balloon. Patient tolerated well with minimal discomfort. Under sterile technique patient prepped with betadine. 16 Fr indwelling catheter inserted into bladder with no difficulty. 10 mls of sterile water instilled within cath balloon. Bladder irrigated with 100 ml sterile water. Clear, yellow urine drained.   Patient tolerated procedure well with no discomfort. Connected catheter to bag to drain by gravity. Patient stable at discharge.

## 2017-06-30 NOTE — Telephone Encounter (Signed)
Pt is calling regarding previous messages. Pt is requesting a call back from [place] at her earliest convenience. Pt states he believes diagnoses should be written on prescription in order for it to be approved.  Please assist. Thank you

## 2017-06-30 NOTE — Telephone Encounter (Signed)
Routing to [employer],  The prescription has the diagnosis of urinary tract infection without hematuria N39.0.    Unsure if Dr Lucia Bitter wanted to change the ABX or how to proceed.    Please advise     Call also placed to Carmelo RN in the clinic

## 2017-07-21 ENCOUNTER — Other Ambulatory Visit: Payer: Self-pay | Admitting: Family Medicine

## 2017-07-24 ENCOUNTER — Telehealth: Payer: Self-pay

## 2017-07-24 NOTE — Telephone Encounter (Signed)
Pt's wife (DPR signed) will ck with ins co about where less expensive to get tetanus shot; pt is due for tetanus injection. Pt's wife will cb if needed.

## 2017-07-24 NOTE — Telephone Encounter (Signed)
Received refill electronically Last refill 05/06/16 #4 Last office visit 01/31/17

## 2017-07-24 NOTE — Telephone Encounter (Signed)
Johnny Holland said tetanus shot is covered but not an adminstration fee or OV charge. Advised nurse visit would include admin fee. [facility]

## 2017-07-25 MED ORDER — SCOPOLAMINE 1 MG/3DAYS TD PT72: 1.0000 | MEDICATED_PATCH | TRANSDERMAL | 1 refills | Status: DC

## 2017-07-25 NOTE — Telephone Encounter (Signed)
Left detailed message on voicemail that script has been sent to the pharmacy per his request. (DPR)

## 2017-07-27 ENCOUNTER — Ambulatory Visit: Payer: Medicaid Other | Attending: Urology

## 2017-07-27 DIAGNOSIS — N35013 Post-traumatic anterior urethral stricture: Principal | ICD-10-CM | POA: Insufficient documentation

## 2017-07-27 NOTE — Interdisciplinary (Signed)
Catheter Change:    Patient presents to clinic for routine catheter change, per standing order from MD. Patientambulatory to clinic and in no acute distress. No new sx or concerns reported.    Preparation:  Allergies verified with patient  Procedure explained  Patient verbalized understanding w/o objections  Patient positioned and prepped for procedure    Procedure:  16 Fr indwelling catheter discontinued with no difficulty after fully deflating cath balloon - 8 mls of water aspirated from cath balloon. Patient tolerated well with minimal discomfort. Under sterile technique patient prepped with betadine. 16 Fr indwelling catheter inserted into bladder with no difficulty. 10 mls of sterile water instilled within cath balloon. Bladder irrigated with 80 ml sterile water. Clear, yellow urine drained.   Patient tolerated procedure well with no discomfort. Connected catheter to bag to drain by gravity. Patient stable at discharge.

## 2017-07-28 ENCOUNTER — Telehealth: Payer: Self-pay | Admitting: *Deleted

## 2017-07-28 MED ORDER — MECLIZINE HCL 25 MG PO TABS: 12.5000 mg | ORAL_TABLET | Freq: Two times a day (BID) | ORAL | 0 refills | Status: DC | PRN

## 2017-07-28 NOTE — Telephone Encounter (Signed)
Left detailed message on patient phone stating the mail service is completely out of generic and trade brand of the patches and will notify patient the Rx is on hold until patient is in need once they receive backorders. Patient can take meclizine for the moment until completely out and attempt to contact [facility] to see if they have receive their supplies. [facility] for [facility] to [facility] up

## 2017-07-28 NOTE — Telephone Encounter (Signed)
Could try oral meclizine - sent to pharmacy.

## 2017-07-28 NOTE — Telephone Encounter (Signed)
Spoke to Twyla at [facility] who states pts scopolamine patch is on back order and they are requesting a new Rx. Ok to sent to pharmacy or they can be reached at tele# on file. Ref# [id number]

## 2017-07-31 ENCOUNTER — Telehealth (HOSPITAL_BASED_OUTPATIENT_CLINIC_OR_DEPARTMENT_OTHER): Payer: Self-pay | Admitting: Urology

## 2017-07-31 MED ORDER — SCOPOLAMINE 1 MG/3DAYS TD PT72: 1.0000 | MEDICATED_PATCH | TRANSDERMAL | 1 refills | Status: DC

## 2017-07-31 NOTE — Telephone Encounter (Signed)
plz notify scopalamine patches sent to [facility].

## 2017-07-31 NOTE — Telephone Encounter (Signed)
Patient's wife left a voicemail stating that since the mail order pharmacy is out of the Scopolamine patches at this time they would like for the script to be sent to [facility].

## 2017-07-31 NOTE — Telephone Encounter (Signed)
Pt is requesting a call-back from [place] in regards to sx with Dr. Lucia Bitter. Pt would like to know what is going on with insurance/ getting cleared for sx. Please assist. Thank you

## 2017-07-31 NOTE — Telephone Encounter (Signed)
Pt returned call requesting to speak with nurse. Please [phone] thank you.

## 2017-07-31 NOTE — Telephone Encounter (Signed)
LVM for pt that will discuss case with Dr. Lucia Bitter tomorrow in terms of getting pt cleared for surgery (fosfomycin not covered by insurance).

## 2017-08-02 NOTE — Telephone Encounter (Signed)
Spoke to pt & relayed Dr. Edrick Oh plan to repeat UCX & if still in need of fosfomycin, we will pre-admit pt for surgery. Pt in agreement of plan & will call to book nurse visit to obtain UCX from SPT.

## 2017-08-03 ENCOUNTER — Telehealth (HOSPITAL_BASED_OUTPATIENT_CLINIC_OR_DEPARTMENT_OTHER): Payer: Self-pay | Admitting: Urology

## 2017-08-03 NOTE — Telephone Encounter (Signed)
Pt calling, would like to inform Latanya Presser that he will be stopping by on Tuesday at 10am to clinic for UA

## 2017-08-08 ENCOUNTER — Telehealth (HOSPITAL_BASED_OUTPATIENT_CLINIC_OR_DEPARTMENT_OTHER): Payer: Self-pay | Admitting: Urology

## 2017-08-08 ENCOUNTER — Ambulatory Visit (HOSPITAL_BASED_OUTPATIENT_CLINIC_OR_DEPARTMENT_OTHER): Payer: Medicaid Other

## 2017-08-08 ENCOUNTER — Ambulatory Visit: Payer: Medicaid Other | Attending: Urology

## 2017-08-08 ENCOUNTER — Other Ambulatory Visit (INDEPENDENT_AMBULATORY_CARE_PROVIDER_SITE_OTHER): Payer: Medicaid Other

## 2017-08-08 DIAGNOSIS — Z9359 Other cystostomy status: Principal | ICD-10-CM | POA: Insufficient documentation

## 2017-08-08 DIAGNOSIS — N35012 Post-traumatic membranous urethral stricture: Secondary | ICD-10-CM | POA: Insufficient documentation

## 2017-08-08 NOTE — Interdisciplinary (Signed)
Patient here for urine specimen collection per RN Haynes Dage.  Collected urine via SPT catheter.  Replaced leg bag per pt request.  Patient left in satisfactory condition.

## 2017-08-08 NOTE — Telephone Encounter (Signed)
Need to cancel 10/25 appt with provider hsieh.  Appt cancelled for now and rescheduled to 10/18 @ 2  Message left for pt to call back to confirm new appt.  CALL CENTER: Please confirm date and time and document this has been done.

## 2017-08-11 ENCOUNTER — Telehealth (HOSPITAL_BASED_OUTPATIENT_CLINIC_OR_DEPARTMENT_OTHER): Payer: Self-pay | Admitting: Urology

## 2017-08-11 NOTE — Telephone Encounter (Signed)
Patient is calling to request lab results from 8/21 by Dr.Buckley.

## 2017-08-12 LAB — URINE CULTURE

## 2017-08-14 NOTE — Telephone Encounter (Signed)
LVM for pt that UCX repeat is same as in past where fosfomycin is required. Requested call back on direct nurse line

## 2017-08-15 ENCOUNTER — Telehealth (HOSPITAL_BASED_OUTPATIENT_CLINIC_OR_DEPARTMENT_OTHER): Payer: Self-pay | Admitting: Urology

## 2017-08-15 DIAGNOSIS — N359 Urethral stricture, unspecified: Secondary | ICD-10-CM

## 2017-08-15 MED ORDER — FOSFOMYCIN TROMETHAMINE 3 GM OR PACK
3.00 g | PACK | ORAL | 0 refills | Status: AC
Start: 2017-08-15 — End: 2017-08-20

## 2017-08-15 NOTE — Telephone Encounter (Signed)
Will try obtain fosfomycin to treat urine culture prior to urethroplasty.  If not approved will need to be preadmitted for IV abx.

## 2017-08-16 NOTE — Telephone Encounter (Signed)
Spoke with pt this pt is seeing buckley, no further info needed.

## 2017-08-24 ENCOUNTER — Ambulatory Visit: Payer: Medicaid Other | Attending: Urology

## 2017-08-24 DIAGNOSIS — N35013 Post-traumatic anterior urethral stricture: Principal | ICD-10-CM | POA: Insufficient documentation

## 2017-08-24 NOTE — Interdisciplinary (Signed)
Patient here for Suprapubic catheter change per MD standing order.  Patient is alert and oriented. He denies pain or any discomfort   Name/DOB/Allergies verified. 16Fr indwelling foley catheter discontinued w/ no difficulty, catheter tip intact . 60mls of water aspirated from cath balloon. Under sterile technique; pt sterilely prepped w/ Iodine and Lidocaine HCL Jelly 2% instilled into bladder. 16Fr indwelling f/c inserted into bladder w/ no difficulty. 70mls of sterile water instilled within cath balloon. 90 mls urine drained. Bladder irrigated w/  Sterile water w/ return of clear urine. Pt tolerated procedure well w/ minimal discomfort. Connected to collection bag to drain by gravity. Catheter care education provided and verbalized understanding . Stable at discharge.

## 2017-09-21 ENCOUNTER — Ambulatory Visit: Payer: Medicaid Other | Attending: Urology

## 2017-09-21 DIAGNOSIS — N35013 Post-traumatic anterior urethral stricture: Principal | ICD-10-CM | POA: Insufficient documentation

## 2017-09-21 NOTE — Interdisciplinary (Signed)
Patient presents to clinic for routine SPT cath change, per standing order from MD.   Patient ambulatory to clinic and in no acute distress. No new symptoms or concerns reported.     Preparation:  Allergies verified with patient  Procedure explained  Patient verbalized understanding without objections  Patient positioned and prepped for procedure    Procedure:  16 Fr SPT catheter discontinued with no difficulty after fully deflating cath balloon - 7 mls of water aspirated from cath balloon. Patient tolerated well with minimal discomfort. Under sterile technique patient prepped with betadine. 16 Fr SPT catheter inserted into bladder with no difficulty. 10 mls of sterile water instilled within cath balloon. Bladder irrigated with 139ml sterile water. Clear, yellow urine drained.   Patient tolerated procedure well with no discomfort. Connected catheter to leg bag to drain by gravity. Catheter secured with stat lock. Patient stable at discharge.    Patient Education:  Foley cath care instructions given to patient   Verbalized good understanding of instructions given

## 2017-10-05 ENCOUNTER — Telehealth (HOSPITAL_BASED_OUTPATIENT_CLINIC_OR_DEPARTMENT_OTHER): Payer: Self-pay | Admitting: Urology

## 2017-10-05 DIAGNOSIS — Z87448 Personal history of other diseases of urinary system: Principal | ICD-10-CM

## 2017-10-05 NOTE — Telephone Encounter (Signed)
Pt is calling asking to speak w/RN Haynes Dage regarding a possible procedure and medication.  Pt did not elaborate further.    Requests call back  [phone]

## 2017-10-11 ENCOUNTER — Telehealth (HOSPITAL_BASED_OUTPATIENT_CLINIC_OR_DEPARTMENT_OTHER): Payer: Self-pay | Admitting: Urology

## 2017-10-11 DIAGNOSIS — N320 Bladder-neck obstruction: Secondary | ICD-10-CM | POA: Insufficient documentation

## 2017-10-11 NOTE — Telephone Encounter (Signed)
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  We will move forward with scheduling his surgery.  He should get a new urine culture approximately 2 weeks preop.  If there is a good PO option (bactrim would have been effective on his 08/08/2017) we will pretreat for at least 72hrs preop.  If there is not he will need to be preadmitted for 24hrs of IV abx.

## 2017-10-11 NOTE — Telephone Encounter (Signed)
Patient requesting to speak with RN Haynes Dage to discuss his medical and the possibility of surgery. Patient stated it's been ongoing for a month and he wishes to get this taken care of if possible.     Please advise [phone].

## 2017-10-11 NOTE — Telephone Encounter (Signed)
Left message for pt that Stephen [name] is currently on vacation. Advise to call back if another nurse can help out, if not message will be left to [place] that pt called.

## 2017-10-11 NOTE — Telephone Encounter (Addendum)
Patient is calling to request a status update for surgery scheduling with Dr.Buckley. Please contact the patient. He would also like to discuss an antibiotic.    Preferred phone [phone]  Ok to leave a detailed message.

## 2017-10-12 NOTE — Telephone Encounter (Signed)
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 We will move forward with scheduling his surgery. He should get a new urine culture approximately 2 weeks preop. If there is a good PO option (bactrim would have been effective on his 08/08/2017) we will pretreat for at least 72hrs preop. If there is not he will need to be preadmitted for 24hrs of IV abx.    (Routing comment)

## 2017-10-18 ENCOUNTER — Encounter (HOSPITAL_BASED_OUTPATIENT_CLINIC_OR_DEPARTMENT_OTHER): Payer: Self-pay | Admitting: Anesthesiology

## 2017-10-18 ENCOUNTER — Ambulatory Visit: Payer: Medicaid Other | Attending: Anesthesiology | Admitting: Anesthesiology

## 2017-10-18 VITALS — BP 138/61 | HR 50 | Temp 98.7°F

## 2017-10-18 DIAGNOSIS — M5416 Radiculopathy, lumbar region: Secondary | ICD-10-CM

## 2017-10-18 DIAGNOSIS — M5412 Radiculopathy, cervical region: Principal | ICD-10-CM | POA: Insufficient documentation

## 2017-10-18 NOTE — Patient Instructions (Signed)
*  Please allow up to 14 business days for the authorization to be processed and you will be contacted to schedule your procedure once it has been approved.  *Please call [phone] option #0     Follow Up Appointment  After procedure      If you have any questions please don't hesitate to call our clinic at [phone].      What is an Epidural Steroid Injection?   In the spine, the epidural space is the outermost portion of the spinal canal.     An epidural injection is a technique in which medication is introduced into this space, just outside the sheath that surrounds your nerves and spinal cord (dura mater). It can be performed in the neck (cervical) or low back (lumbar).     This injection includes a corticosteroid medication, sometimes mixed with a local anesthetic. This medication spreads through the epidural space near the site of injection and reduces the inflammation surrounding the nerves, hopefully relieving symptoms and breaking the pain cycle.     The injection is performed using X-ray guidance (fluoroscopy) to help your physician guide the needle into the proper location and also avoid surrounding tissues.     Conditions Commonly Treated with Epidural Steroid Injection:   Epidural steroid injections are thought to be most effective for treating pain that radiates into your limbs (also known as radiculopathy or sciatica), though they also can often be effective for simple back pain arising from the intervertebral discs and other structures in the spine.     Types of Epidural Steroid Injections:   Interlaminar Injection: After your skin is anesthetized, the needle will enter near mid-line of your back. Through a combination of the image guidance and a loss-of-resistance technique, the needle will be carefully guided into the epidural space. Contrast is then injected to confirm the needle placement. The medication is then delivered into the epidural space and spreads to the nerve roots on both  sides of the spine.     Transforaminal Injection: After your skin is anesthetized, the needle will enter from the side of the spine and be directed towards the opening (foramen) of the irritated nerve root. Contrast is then injected to confirm the needle location before medication is administered. This approach is more specific and treats one side at a time.     Caudal Epidural Injection: After your skin is anesthetized the needle enters the epidural space through an opening in your tailbone. Like the interlaminar injection, the medication will spread to both sides of your spine.     What are the risks?   Epidural steroid injections are considered safe in general.     However, as with any medical procedure, there are potential risks associated with the procedure. These include bleeding, infection, headache, and nerve damage. Furthermore, there are potential side effects of the corticosteroid medications also, which includes elevated blood sugars, elevated blood pressure, and temporary weakening of the immune system. Through the use of image guidance and sterile technique, we will take every measure to minimize these potential risks and maximize the therapeutic benefit.     How long does it last?   The amount and duration of pain relief will vary from person to person. Most patients report relief for about 2-3 months, though some patients will report relief that lasts longer than that.

## 2017-10-18 NOTE — Progress Notes (Signed)
PAIN NEW CONSULT NOTE    Referring Physician [name]  Primary Care Physician [name]    History of Illness:  This is a 66 year old male with a chief complaint of neck, low back and posterior back. This patient was seen for a pain consultation requested by [name]. 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  Low back pain started approx 10 years ago.  Worse on the left posterior low back and to his left posterior hamstring.  Pt c/o pain with sitting.  Pain is worse with everything.  Pain constant.  7/10.    Pt has had neck pain for many years.  C/l pain on his posterior neck.  Aching/burning, 7/10 pain.  Worse with turning his head from side to side.  No radicular pain down his arm but but he has right trapezius pain.    Pt had a pain management doctor and was not happy with his care, is transitioning his care to [facility]          Current Description of Symptoms:  Patient stated their pain today is 7/10. On the pain diagram today the patient shades in the areas of their neck, low back, posterior thigh.  They describe their pain as constant and excruciating pressure, aching and burning. Patient states their pain is associated with weakness and tightness. This pain has made it hard for the patient to walk, sleep, work, exercise and eat. Patient states pain is worse with can't predict.     Over the last 7 days the patient's pain has been at its worst 9/10, at best 8/10 and averages 10/10.   Significant PMH/PSH:   Past Medical History:   Diagnosis Date    Chronic suprapubic catheter (CMS-HCC)     Congenital hydronephrosis     Gout     Headache     Hematuria     Kidney disease     Kidney stones     Major depressive disorder, single episode     Polyarthropathy or polyarthritis of multiple sites     Urethral stricture      Past Surgical History:   Procedure Laterality Date    APPENDECTOMY      CT INSERTION OF SUPRAPUBIC CATH  09/25/2015    CYSTOSCOPY      CYSTOSCOPY W/ LASER LITHOTRIPSY       NEPHRECTOMY Right 1995    OTHER SURGICAL HISTORY      Interstim 01/29/2011     Social History     Social History    Marital status: Single     Spouse name: N/A    Number of children: N/A    Years of education: N/A     Occupational History    Not on file.     Social History Main Topics    Smoking status: Never Smoker    Smokeless tobacco: Never Used    Alcohol use Not on file    Drug use: Not on file    Sexual activity: No     Other Topics Concern    Not on file     Social History Narrative     Additional Social History:   Currently working/school: no  Open legal case related to pain: no  Alcohol or substance abuse/use: no  History of DUI: no. History of alcohol/substance abuse treatment: no  Current exercise: yes - walks  History of childhood sexual abuse: no  History of Depression/Anxiety/Mental Illness: no  Opioid Risk Tool Score:   Male Opioid Risk Tool  Family history of alcohol abuse: No  Family history of illegal drug use: No  Family history of prescription drugs use: No  Personal history of alcohol abuse: No  Personal history of illegal drug use : No  Personal history of prescription drug abuse : No  Age between 53 and 74: No  History of preadolescent sexual abuse: No  Psychological disease history of ADD, OCD, Bipolar, Schizophrenia: No  Psychological disease history of depression: Yes  Total score: 1  Risk Assessment (Low (0-3), Med (4-7), High (>8): Low Risk        Risk score based on score of opioid risk tool.    Family History   Problem Relation Age of Onset    Adopted: Yes    Family history unknown: Yes     Additional Family History:  Family history of Alcoholism: no  Family history of Substance Abuse: no    Review of Systems:   Review of Systems   Constitutional: Positive for activity change and fatigue. Negative for diaphoresis.   HENT: Negative for ear pain.    Eyes: Negative for photophobia.   Respiratory: Negative for chest tightness, shortness of breath and wheezing.       Cardiovascular: Negative for chest pain.   Gastrointestinal: Negative for abdominal distention, abdominal pain and constipation.   Endocrine: Negative for cold intolerance.   Musculoskeletal: Positive for arthralgias, back pain, myalgias and neck pain. Negative for joint swelling.   Skin: Negative for color change and rash.   Neurological: Positive for headaches. Negative for dizziness.   Psychiatric/Behavioral: Positive for dysphoric mood. Negative for agitation.           Diagnostic History:   Patient has had the following tests to evaluate their pain        Therapeutic History:   Patient has seen other pain providers to treat the current problem.   Prior interventional pain procedures include: Prior neck/lower back injections.  States the unknown neck injections didn't work  Non-interventional pain treatments include: Pt completed 6 weeks of physical therapy  Patient has tried the following pain medications: Has only tried ibuprofen and tylenol and tizanidine  Current Pain Medications:   Iburprofen  Tizanidine 4mg  TID    Current Outpatient Prescriptions   Medication Sig Dispense Refill    allopurinol (ZYLOPRIM) 300 MG tablet Take 300 mg by mouth daily.      amLODIPINE (NORVASC) 5 MG tablet Take 10 mg by mouth daily.      atenolol (TENORMIN) 25 MG tablet Take 25 mg by mouth daily.      ibuprofen (MOTRIN) 200 MG tablet Take 200 mg by mouth every 6 hours as needed for Mild Pain (Pain Score 1-3).      Ibuprofen-Diphenhydramine Cit (ADVIL PM PO) as needed (1 qhs).      meloxicam (MOBIC) 7.5 MG tablet        tizanidine (ZANAFLEX) 4 MG capsule Take 4 mg by mouth 3 times daily.       No current facility-administered medications for this visit.        Patient is not currentlyon any anticoagulation medications    Allergies   Allergen Reactions    Sulfa Drugs Unspecified       Physical Exam:   Physical Exam  Vitals: BP 138/61 (BP Location: Right arm, BP Patient Position: Sitting, BP cuff size: Regular)   Pulse 50    Temp 98.7 F (37.1 C) (Oral)  Physical Exam   Constitutional: He is well-developed, well-nourished, and in no distress. No distress.   HENT:   Head: Normocephalic and atraumatic.   Eyes: Pupils are equal, round, and reactive to light.   Pulmonary/Chest: Effort normal.   Abdominal:   Appears flat   Neurological: He is alert.   Skin: No rash noted. He is not diaphoretic.   Psychiatric: Affect normal.       C-Spine   Extension (normal 40): full without pain  Left Extension/Rotation: full without pain   Right Extension/Rotation: Limited with pain  Right Rotation (normal 50):   Limited with  Left Rotation (normal 50):  Limited with  Facet palpation: Right: tender; Left: tender    Upper Extremities  Shoulders ROM: full without pain  Shoulders Palpation: non-tender    Motor Strength  Shoulder Abduction:   5 bilaterally  Biceps:            5 bilaterally  Triceps:         5 bilaterally  Wrist Extension:         5 bilaterally  Inter-osseous:             5 bilaterally  Reflexes:    Musculoskeletal:   L-Spine    Flexion (normal 45):   Limited with pain  Extension (normal 25):   Limited with pain  Lateral Flexion Right (normal 25): Limited with pain  Lateral Flexion Left (normal 25): Limited with pain  Extension-Rotation Right:   Limited with pain  Extension-Rotation Left:   Limited with pain  Facet Palpation: Right: tender; Left: tender  Straight Leg Raise: Right negative; Left negative         Sacroiliac Joint   Palpation: Right: non-tender; Left: non-tender  Compression Test: Right negative; Left negative  Fortin Finger Test: Right negative; Left negative  Patrick's Test (Flexion Abduction Exter Rot): Right negative; Left negative    Lower-Extremities  Hips  (flex 100 ext 30 ab 40 ad 20 ir 40 er 45): full without pain  Knees  (flex 130): full without pain  Trochanteric bursa:  Right non-tender; Left non-tender  Motor Strength  Iliopsoas:  5 bilaterally  Quadriceps:  5 bilaterally  Hamstrings:    5  bilaterally  Ankle Dorsi-flexion:   5 bilaterally  Ankle Planar-flexion:  5 bilaterally  Ext-hallucis longus 5 bilaterally    Patient with myofascial pain. Examination of the tender areas using about 4kg/cm2 pressure with the examiner's nail blanching showed: Palpable taut bands were felt in the paraspinal, quadratus lomborum, mitifidus muscle with a positive jump sign. There was reproduction of a referred pain pattern upon stimulation of the trigger point.    Diagnosis  Encounter Diagnoses   Name Primary?    Cervical radiculopathy Yes    Lumbar radiculopathy        Assessment  This is a 66 year old male with neck and low axial back pain.  His cervical neck pain is likely cervical radiculopathy.  His low back pain is likely multifactorial with myofacial pain and lumbar radiculopathy.    Pt not on opiates and does not desire opiates.  Patient without bowel/bladder incontinence, no lower extremity weakness, no saddle anesthesia.  No back pain red flags on history or physical: Patient has no history of malignancy, unexplained weight loss, longstanding steroid or other immunosuppressant use, fevers, rigors, malaise, or recent infection. No history of IVDU or skin-popping.  Patient has no tenderness overlying spinous process and has a normal gait with retained ability to  walk on heels/ toes. No focal weakness, normal strenght both proximally and distally in lower and upper extremities     PLAN  Interventions: We are recommending a L5/S1 LESI and C7/T1 CESI in our procedures suite. He will first need a Cervical ESI before ordering a LESIThe pt has been educated regarding the risks (including bleeding, infection, increased pain, nerve damage, or allergic reaction), benefits, and alternatives. They state they understand and are eager to proceed. Once we assess the patient's response we may or may not consider pRFA of cervical spine    Medication recommendations: None    Tests/Other:  Cervical MRI prior to CESI    We also  recommend the patient start a low impact exercise program such as aqua therapy or recumbent bike as tolerated to improve cardiovascular function, core strength, and flexibility.     Follow-up: After procedure  Thank you for the consultation, please call with any questions.    Melina Modena, MD, Pain Fellow,was involved in the care of this patient.  I was responsible for all key aspects of the evaluation, treatment planning and discussion with the patient.

## 2017-10-19 ENCOUNTER — Ambulatory Visit: Payer: Medicaid Other | Attending: Urology

## 2017-10-19 ENCOUNTER — Telehealth (HOSPITAL_BASED_OUTPATIENT_CLINIC_OR_DEPARTMENT_OTHER): Payer: Self-pay | Admitting: Urology

## 2017-10-19 DIAGNOSIS — N35013 Post-traumatic anterior urethral stricture: Principal | ICD-10-CM | POA: Insufficient documentation

## 2017-10-19 NOTE — Telephone Encounter (Signed)
Spoke to patient.   Pt want to inform Dr Lucia Bitter that he has an interstim implant. Pt to undergo DVIU.   Informed pt I will inform Dr Lucia Bitter.

## 2017-10-19 NOTE — Telephone Encounter (Signed)
Patient is requesting a call back to discuss his inter steam implant.  Please contact pt to further assist at [phone]

## 2017-10-19 NOTE — Interdisciplinary (Signed)
Catheter Change:    Patient presents to clinic for routine catheter change, per standing order from MD. Patientambulatory to clinic and in no acute distress. No new sx or concerns reported.    Preparation:  Allergies verified with patient  Procedure explained  Patient verbalized understanding w/o objections  Patient positioned and prepped for procedure    Procedure:  16 Fr indwelling catheter discontinued with no difficulty after fully deflating cath balloon - 7 mls of water aspirated from cath balloon. Patient tolerated well with minimal discomfort. Under sterile technique patient prepped with betadine. 16 Fr indwelling catheter inserted into bladder with no difficulty. 10 mls of sterile water instilled within cath balloon. Bladder irrigated with 60 ml sterile water. Clear, yellow urine drained.   Patient tolerated procedure well with no discomfort. Connected catheter to bag to drain by gravity. Patient stable at discharge.

## 2017-10-20 ENCOUNTER — Telehealth (HOSPITAL_BASED_OUTPATIENT_CLINIC_OR_DEPARTMENT_OTHER): Payer: Self-pay | Admitting: Anesthesiology

## 2017-10-20 NOTE — Telephone Encounter (Signed)
Pt is returning call back from Dr. Edythe Lynn in regards to previous message to schedule MRI while having interstem implant. Please advise, he is requesting a call back again.

## 2017-10-20 NOTE — Telephone Encounter (Signed)
Pt states he thought he was getting an MRI of the lumbar spine but was told he can get an MRI of the cervical.

## 2017-10-20 NOTE — Telephone Encounter (Signed)
Called patient to discuss telephone call.  There was no answer.  I left a voicemail along with return clinic phone number.

## 2017-10-20 NOTE — Telephone Encounter (Signed)
Patient called regarding his consultation on 10/31. 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 Patient states that MRIs are not recommended with the implant. He is requesting a call back to further discuss his concerns at [phone]. Please advise.

## 2017-11-07 ENCOUNTER — Telehealth (HOSPITAL_BASED_OUTPATIENT_CLINIC_OR_DEPARTMENT_OTHER): Payer: Self-pay | Admitting: Anesthesiology

## 2017-11-07 NOTE — Telephone Encounter (Signed)
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  He can't get a cervical MRI.  We saw him in clinic and wanted a cervical MRI prior to a CESI.  Pt still wants procedure and the procedure clinic is calling to schedule.  Do you think he still needs his cervical MRI?

## 2017-11-07 NOTE — Telephone Encounter (Signed)
Patient called to inform Dr [name] that his epidural was approved, but that he is unable to have the MRI because Radiology will not perform it with his Medtronics implant. He is requesting a call back to further discuss his concern at [phone]. Please advise.

## 2017-11-15 NOTE — Addendum Note (Signed)
Addended by: Tera Mater on: 11/15/2017 02:47 PM     Modules accepted: Orders

## 2017-11-15 NOTE — Telephone Encounter (Signed)
Spoke to pt who is scheduled for surgery 12/10 & who is in need of a pre-op UCX. Pt states will have catheter changed at [facility] tomorrow. Instructed pt to ask nurse to take a cath sample for UCX. Agreed. Order placed. Message routed to [facility] to please take [facility]

## 2017-11-16 ENCOUNTER — Telehealth (HOSPITAL_BASED_OUTPATIENT_CLINIC_OR_DEPARTMENT_OTHER): Payer: Self-pay

## 2017-11-16 ENCOUNTER — Ambulatory Visit: Payer: Medicaid Other | Attending: Urology

## 2017-11-16 ENCOUNTER — Other Ambulatory Visit
Admission: RE | Admit: 2017-11-16 | Discharge: 2017-11-16 | Disposition: A | Discharge status: Home / Self Care | Payer: Medicaid Other | Attending: Urology | Admitting: Urology

## 2017-11-16 DIAGNOSIS — Z87448 Personal history of other diseases of urinary system: Principal | ICD-10-CM | POA: Insufficient documentation

## 2017-11-16 DIAGNOSIS — N35013 Post-traumatic anterior urethral stricture: Principal | ICD-10-CM | POA: Insufficient documentation

## 2017-11-16 NOTE — Telephone Encounter (Addendum)
Spoke to patient scheduled for surgery per Dr. Lucia Bitter epic order.  PT REQUESTING CALL FROM [facility] TO DISCUSS INJECTION HE'S SUPPOSED TO HAVE DAY BEFORE SURGERY. Pt gave urine sample today.    Also discussed with patient NPO status and asa precautions prior to surgery.        Patient aware will be unable to drive himself home will need to plan for designated family/friend to drive him day of discharge.    Surgery Plan:    [place] [facility] 12/07/2017 at 12noon   chk in 10am    H&P on dos    Consent in media     Surgery auth requested    *Routing to Dr. Lucia Bitter nurse Haynes Dage to review if any labs or additional prep needed prior to surgery.  If so Minna Antis to call patient with instructions*        ASPIRIN AND IBUPROFEN CAUTION SHEET  FOR SURGERY PATIENTS    For 7 days prior to surgery, do not take any medication containing aspirin or ibuprofen.  Please refer to the list below for some of the medications that contain aspirin and/or ibuprofen.  If it is necessary for you to take any medication, please use Tylenol or acetaminophen substitutes.     Advil     Gingkoba  Alka-Seltzer   Liquiprin  Alleve    Measurin  Anacin    Midol  APC     Motrin  ASA compound   Norgesic  Ascriptin    Novahistine with APC  Aspergum    Nuprin  Bufferin    PAC  CAMA    Percodan  Capron capsules   Phenaphen  Contact    Phensol  Cope     Relafen  Coricidin    Robaxisal  Counterpain   Sal-Fayne  Daprisal    Stanback  Darvon compound  Super Anahist  Dolene compound  Synalogos  Dristan    Talwin compound  Ecotrin    Trigesic  Edrisal    Triphen  Equagesic    Trilisate  Excedrin    Triaminic  Femcaps    Vanquish  Fiorinal    Vitamin E (or any other multi-vitamin)  Plavix    Zactirin    This list does not include every medication that contains aspirin or ibuprofen.  Before taking any medication prior to or after surgery, please read the label carefully for the active ingredients aspirin, salicylates, and/or ibuprofen.  If the medication  contains these ingredients do not use.  Please inform your physician of all medications you are taking, including non-prescription medications

## 2017-11-16 NOTE — Interdisciplinary (Signed)
PER MD STANDING ORDER  Preparation:  Pt here for routine 16 F SPT catheter change   Allergies verified with patient  Procedure explained  Patient verbalized understanding with no  objections  Patient positioned and prepped for procedure.    Procedure:  Verified the following;   Correct patient  Correct procedure  Correct side and site  Correct position  Removed old16F foley catheter. . Deflated 9 ml of sterile water from catheter balloon  Prepped patient in sterile fashion with betadine prep, cleanse procedure site           Cleansed insertion site with betadine. Inserted SPT   aseptically w/o any difficulty 10mls of sterile water  Inflated catheter  balloon. 110mL clear urine drained. Irrigated Therapist, art . Catheter was connected to leg bag  Post-procedure:  Patient tolerated procedure well.   Foley cath care instructions given to patient.   Verbalized good understanding of instructions given  Patient was discharged in stable condition   Pt will follow up on 3-4 weeks time

## 2017-11-17 NOTE — Telephone Encounter (Signed)
Called patient and instructed him to schedule his CESI

## 2017-11-19 LAB — URINE CULTURE

## 2017-11-20 ENCOUNTER — Ambulatory Visit (INDEPENDENT_AMBULATORY_CARE_PROVIDER_SITE_OTHER): Payer: BLUE CROSS/BLUE SHIELD | Admitting: Family Medicine

## 2017-11-20 ENCOUNTER — Ambulatory Visit (INDEPENDENT_AMBULATORY_CARE_PROVIDER_SITE_OTHER)
Admission: RE | Admit: 2017-11-20 | Discharge: 2017-11-20 | Disposition: A | Discharge status: Home / Self Care | Payer: BLUE CROSS/BLUE SHIELD | Source: Ambulatory Visit | Attending: Family Medicine | Admitting: Family Medicine

## 2017-11-20 ENCOUNTER — Encounter: Payer: Self-pay | Admitting: Family Medicine

## 2017-11-20 ENCOUNTER — Other Ambulatory Visit: Payer: Self-pay | Admitting: Family Medicine

## 2017-11-20 VITALS — BP 122/70 | HR 50 | Temp 97.9°F | Wt 238.0 lb

## 2017-11-20 DIAGNOSIS — M25551 Pain in right hip: Secondary | ICD-10-CM | POA: Insufficient documentation

## 2017-11-20 DIAGNOSIS — I351 Nonrheumatic aortic (valve) insufficiency: Secondary | ICD-10-CM | POA: Diagnosis not present

## 2017-11-20 DIAGNOSIS — M1611 Unilateral primary osteoarthritis, right hip: Secondary | ICD-10-CM | POA: Insufficient documentation

## 2017-11-20 NOTE — Assessment & Plan Note (Addendum)
No pain to palpation lateral hip at trochanteric bursa points against bursitis. Anticipate osteoarthritis. Check xrays to start evaluation. rec avoid repetitive exercise to conserve cartilage. Discussed OTC supplements (vit D, glucosamine), NSAID prn, consider ortho referral. Pt agrees with plan.

## 2017-11-20 NOTE — Patient Instructions (Signed)
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  May use ibuprofen 400mg  or aleve 220mg  as needed with meals.  Rest leg.  May start vitamin D 2000 units daily, glucosamine supplement for joint health.  If not improving, let us know for referral to orthopedist.

## 2017-11-20 NOTE — Assessment & Plan Note (Signed)
Will need echo updated 02/2018, sooner if worsening sxs.

## 2017-11-20 NOTE — Progress Notes (Signed)
   BP 122/70 (BP Location: Left Arm, Patient Position: Sitting, Cuff Size: Normal)   Pulse (!) 50   Temp 97.9 F (36.6 C) (Oral)   Wt 238 lb (108 kg)   SpO2 94%   BMI 36.19 kg/m    CC: R hip pain Subjective:    Patient ID: Johnny Holland, male    DOB: 1951/02/26, 66 y.o.   MRN: [id number]  HPI: Johnny Holland is a 66 y.o. male presenting on 11/20/2017 for Hip Pain (Right. Started 6-8 wks ago. Pain is dull and sometimes radiates down right leg to ankle. Also, feels sharp pain deep in hip and in right knee. Has not taken anything)   2 mo h/o R hip pain noticed worse at bedtime. Starts deep R hip, radiates down leg to foot. Denies inciting trauma/injury. No lower back pain. Treating with NSAID.   He has been moving heavy things over last several months (moving inlaws to a new house).   Relevant past medical, surgical, family and social history reviewed and updated as indicated. Interim medical history since our last visit reviewed. Allergies and medications reviewed and updated. Outpatient Medications Prior to Visit  Medication Sig Dispense Refill  . pravastatin (PRAVACHOL) 80 MG tablet TAKE 1 TABLET AT BEDTIME 90 tablet 2  . scopolamine (TRANSDERM-SCOP, 1.5 MG,) 1 MG/3DAYS Place 1 patch (1.5 mg total) onto the skin every 3 (three) days. 4 patch 1  . sildenafil (VIAGRA) 100 MG tablet Take 0.5-1 tablets (50-100 mg total) by mouth daily as needed for erectile dysfunction. 5 tablet 3   No facility-administered medications prior to visit.      Per HPI unless specifically indicated in ROS section below Review of Systems     Objective:    BP 122/70 (BP Location: Left Arm, Patient Position: Sitting, Cuff Size: Normal)   Pulse (!) 50   Temp 97.9 F (36.6 C) (Oral)   Wt 238 lb (108 kg)   SpO2 94%   BMI 36.19 kg/m   Wt Readings from Last 3 Encounters:  11/20/17 238 lb (108 kg)  01/31/17 232 lb (105.2 kg)  06/14/16 220 lb (99.8 kg)  Physical Exam  Constitutional: He is oriented to  person, place, and time and well-developed, well-nourished, and in no distress. No distress.  Neck: Carotid bruit is not present.  Cardiovascular:  Murmur (3/6 SEM USB) heard. Musculoskeletal: Normal range of motion. He exhibits no edema.  Neg SLR bilaterally. Pain with int rotation at R hip with seated testing, less pain with supine. Pain at groin with FABER. No pain at SIJ, GTB or sciatic notch bilaterally.   Neurological: He is alert and oriented to person, place, and time.  Nursing note and vitals reviewed.      Assessment & Plan:   Problem List Items Addressed This Visit    Aortic regurgitation    Will need echo updated 02/2018, sooner if worsening sxs.       Right hip pain - Primary    No pain to palpation lateral hip at trochanteric bursa points against bursitis. Anticipate osteoarthritis. Check xrays to start evaluation. rec avoid repetitive exercise to conserve cartilage. Discussed OTC supplements (vit D, glucosamine), NSAID prn, consider ortho referral. Pt agrees with plan.       Relevant Orders   DG HIP UNILAT WITH PELVIS 2-3 VIEWS RIGHT       Follow up plan: Return if symptoms worsen or fail to improve.  Ria Bush, MD

## 2017-11-23 ENCOUNTER — Other Ambulatory Visit (INDEPENDENT_AMBULATORY_CARE_PROVIDER_SITE_OTHER): Payer: Self-pay | Admitting: Urology

## 2017-11-23 ENCOUNTER — Other Ambulatory Visit (HOSPITAL_BASED_OUTPATIENT_CLINIC_OR_DEPARTMENT_OTHER): Payer: Self-pay | Admitting: Anesthesiology

## 2017-11-23 DIAGNOSIS — R52 Pain, unspecified: Principal | ICD-10-CM

## 2017-11-23 LAB — EMMI , ANESTHESIA (ADULT): EMMI Video Order Number: 12588184718

## 2017-11-23 LAB — EMMI , PATIENT SATISFACTION: HOSPITAL DISCHARGE EXPECTATIONS: EMMI Video Order Number: 11571598298

## 2017-11-24 ENCOUNTER — Ambulatory Visit
Admission: RE | Admit: 2017-11-24 | Discharge: 2017-11-24 | Disposition: A | Discharge status: Home / Self Care | Payer: Medicaid Other | Attending: Anesthesiology | Admitting: Anesthesiology

## 2017-11-24 ENCOUNTER — Encounter (HOSPITAL_BASED_OUTPATIENT_CLINIC_OR_DEPARTMENT_OTHER): Admission: RE | Disposition: A | Discharge status: Home Health | Payer: Self-pay | Attending: Anesthesiology

## 2017-11-24 ENCOUNTER — Ambulatory Visit: Payer: Medicaid Other | Attending: Anesthesiology

## 2017-11-24 DIAGNOSIS — Z882 Allergy status to sulfonamides status: Secondary | ICD-10-CM | POA: Insufficient documentation

## 2017-11-24 DIAGNOSIS — Z87442 Personal history of urinary calculi: Secondary | ICD-10-CM | POA: Insufficient documentation

## 2017-11-24 DIAGNOSIS — N289 Disorder of kidney and ureter, unspecified: Secondary | ICD-10-CM | POA: Insufficient documentation

## 2017-11-24 DIAGNOSIS — M5416 Radiculopathy, lumbar region: Principal | ICD-10-CM | POA: Insufficient documentation

## 2017-11-24 DIAGNOSIS — F329 Major depressive disorder, single episode, unspecified: Secondary | ICD-10-CM | POA: Insufficient documentation

## 2017-11-24 DIAGNOSIS — M109 Gout, unspecified: Secondary | ICD-10-CM | POA: Insufficient documentation

## 2017-11-24 DIAGNOSIS — R52 Pain, unspecified: Secondary | ICD-10-CM

## 2017-11-24 DIAGNOSIS — N35919 Unspecified urethral stricture, male, unspecified site: Secondary | ICD-10-CM | POA: Insufficient documentation

## 2017-11-24 DIAGNOSIS — Q620 Congenital hydronephrosis: Secondary | ICD-10-CM | POA: Insufficient documentation

## 2017-11-24 SURGERY — PAIN ESI LUMBAR WITH IMAGING
Anesthesia: Local | Laterality: Left | Wound class: Class I (Clean)

## 2017-11-24 MED ORDER — IOHEXOL 240 MG/ML IJ SOLN
INTRAMUSCULAR | Status: DC
Start: 2017-11-24 — End: 2017-11-24
  Filled 2017-11-24: qty 10

## 2017-11-24 MED ORDER — BETAMETHASONE ACET & SOD PHOS 6 (3-3) MG/ML IJ SUSP
INTRAMUSCULAR | Status: DC | PRN
Start: 2017-11-24 — End: 2017-11-24
  Administered 2017-11-24: 3 mg via EPIDURAL

## 2017-11-24 MED ORDER — IOHEXOL 240 MG/ML IJ SOLN
INTRAMUSCULAR | Status: DC | PRN
Start: 2017-11-24 — End: 2017-11-24
  Administered 2017-11-24: 2 mL via EPIDURAL

## 2017-11-24 MED ORDER — BETAMETHASONE ACET & SOD PHOS 6 (3-3) MG/ML IJ SUSP
INTRAMUSCULAR | Status: DC
Start: 2017-11-24 — End: 2017-11-24
  Filled 2017-11-24: qty 1

## 2017-11-24 MED ORDER — BUPIVACAINE HCL (PF) 0.25 % IJ SOLN
INTRAMUSCULAR | Status: DC
Start: 2017-11-24 — End: 2017-11-24
  Filled 2017-11-24: qty 10

## 2017-11-24 SURGICAL SUPPLY — 12 items
APPLICATOR CHLORAPREP 3ML, CLEAR (Misc Medical Supply) ×2 IMPLANT
GLOVE BIOGEL PI ULTRATOUCH SIZE 7 (Gloves/gowns) ×2 IMPLANT
GLOVE BIOGEL PI ULTRATOUCH SIZE 7.5 (Gloves/gowns) ×4
GLOVE BIOGEL PI ULTRATOUCH SIZE 8 (Gloves/gowns) IMPLANT
MARKER SECURELINE SURG SKIN (Misc Medical Supply) ×2 IMPLANT
NEEDLE BD HYPO 27G X 1.25" (Needles/punch/cannula/biopsy)
NEEDLE EPIDURAL TUOHY 20G X 4.5" (Needles/punch/cannula/biopsy)
NEEDLE SPINE QUINCKE 23G X 3.5" (Needles/punch/cannula/biopsy) IMPLANT
NEEDLE SPINE QUINCKE 25G X 3.5" (Needles/punch/cannula/biopsy) IMPLANT
SYRINGE HYPO LL 10CC (Needles/punch/cannula/biopsy) IMPLANT
SYRINGE HYPO LL 20CC (Needles/punch/cannula/biopsy) IMPLANT
TRAY SINGLE SHOT EPIDURAL (Procedure Packs/kits) ×2 IMPLANT

## 2017-11-24 NOTE — H&P (Signed)
Ambulatory Surgery/Invasive Procedure History and Physical      Primary Care Physician [name]    Chief Complaint:  low back and leg pain     66 year old male here for a lumbar ESI for pain control.    BP 120/69   Pulse 52   Temp 98.1 F (36.7 C)   SpO2 99%    Past Medical History:   Diagnosis Date    Chronic suprapubic catheter (CMS-HCC)     Congenital hydronephrosis     Gout     Headache     Hematuria     Kidney disease     Kidney stones     Major depressive disorder, single episode     Polyarthropathy or polyarthritis of multiple sites     Urethral stricture        Allergies   Allergen Reactions    Sulfa Drugs Unspecified       No current facility-administered medications for this encounter.        I have reviewed the past medical history, allergies and current medications as documented in the electronic health record.      Physical Exam       Can this patient make their own healthcare decisions?  Yes    Chest:  Breaths easily     Heart:  RRR    Abdomen:  Soft    Pain Management Needs/Options discussed.    No Advanced Directives.      Resuscitative Status:  Full Code, Full Care    Diagnosis:    ICD-10-CM ICD-9-CM    1. Lumbar radiculopathy M54.16 724.4     Added automatically from request for surgery [id number]   2. Pain R52 780.96        Procedure: Lumbar epidural steroid injection    This is the first injection of this type.     Discussed Risks, Benefits, and Alternatives to procedure.  Questions answered.  Patient voiced understanding and wished to proceed. Consent Signed.    See procedure note of same date.

## 2017-11-24 NOTE — Discharge Instructions (Signed)
Patient verbalized understanding of below instructions    Procedure Done Today: Lumbar epidural steroid injection with xray  Follow up will be a cervical epidural steroid injection that we will call to schedule.      Post Procedure Instructions  Continue present medication unless otherwise indicated.  Resume your normal diet after being discharge.  Ice pack to treatment site (no more than 20 minutes at a time) if needed for pain relief and/or muscle spasm.  Avoid strenuous activities and driving until tomorrow morning.  If you have a band-aid dressing, you may remove it tomorrow morning.  Resume normal activities tomorrow morning, unless otherwise directed.  No submersion in water for 24 hrs.  If your next appointment is at the Pain Clinic, then please call [phone] to make an appointment.  If your next appointment is another procedure, then please call [phone] to make an appointment.    Call the [facility],  [phone] and ask for the pain management provider on call for ANY sign of:  Temperature above 101.5 F  Redness or drainage at the treatment site  Severe, uncontrollable headache    Call [facility]

## 2017-11-24 NOTE — Op Note (Signed)
Procedure Note, [facility]    Preoperative Diagnosis: Lumbar radiculopathy    Postoperative Diagnosis: Lumbar radiculopathy    Procedure: 1. L5-S1 interlaminar lumbar  epidural steroid injection    2.   Fluoroscopy for needle guidance.  3.  Lumbar epidurography.     Surgeon/Staff: Ophelia Shoulder. [name], MD  Assistant: Earl Lites, M.D.    Anesthesia:  Lidocaine 1%       Fluoroscopy Time:   8     seconds                                 Indications: The patient c/o  Axial Low Back Pain and Lower Extremity Pain. The patient's history and physical findings are consistent with Lumbar radiculopathy. They are here for an injection at the site thought to be the source of their pain. This is the first injection performed for this patient.    Procedure in detail:  Written informed consent was obtained.  The chart was reviewed, questions were answered and the patient wished to proceed. The patient had no contraindications to the procedure.  Vital signs were stable. Standard monitoring was applied.  The patient, the procedure nurse, and the physician confirmed the site of injection after an official "time out." The skin over the injection site was also marked and confirmed as the site of the injection.    Localization Time Out:  An additional intraoperative timeout, specifically to confirm accurate localization, was conducted by the attending physician.     The patient remained awake and alert throughout the procedure.  The patient was placed in the prone position.  The skin was prepped with chlorhexidine and sterile drapes were applied. The skin and soft tissues were anesthetized with Lidocaine 1%. A  20 gauge 3.5 inch Touhy needle epidural needle was inserted percutaneously and advanced under fluoroscopic guidance using  AP, and lateral projections. Using loss of resistance to preservative free normal saline and a left sided interlaminar approach, the L5-S1 posterior epidural space was entered after the lamina was  contacted with the epidural needle.  No paraesthesias occurred and aspiration was negative   Epidurography and radiographic interpretation: Epidurography was performed by injection of Omnipaque 240 under live fluoroscopy.  Multiple Xray images were obtained.  Radiologic examination confirmed proper spread of the contrast medium within the epidural space.  There was no evidence of intrathecal or intravascular runoff..    The needle was injected with Betamethasone (6 mg/mL) 1 mL and Preservative free normal saline 2 mL.  The needle was removed.  A sterile bandage was applied.  The patient did not experience any hemodynamic or neurologic sequelae.  The attending physician was present during all critical points of the procedure.    The patient was transferred to the recovery area. Post operative instructions were explained and given to the  patient. The patient was discharged in stable health and given a procedure clinic appointment.

## 2017-11-28 ENCOUNTER — Encounter (HOSPITAL_BASED_OUTPATIENT_CLINIC_OR_DEPARTMENT_OTHER): Payer: Self-pay | Admitting: Urology

## 2017-11-28 DIAGNOSIS — N3000 Acute cystitis without hematuria: Principal | ICD-10-CM

## 2017-11-28 MED ORDER — NITROFURANTOIN MONOHYD MACRO 100 MG OR CAPS
100.00 mg | ORAL_CAPSULE | Freq: Two times a day (BID) | ORAL | 0 refills | Status: AC
Start: 2017-11-28 — End: 2017-12-05

## 2017-11-28 NOTE — Progress Notes (Signed)
Macrobid ordered and sent to preferred pharmacy. Nursing to call for him to start 1 week before surgery.

## 2017-11-30 ENCOUNTER — Telehealth (HOSPITAL_BASED_OUTPATIENT_CLINIC_OR_DEPARTMENT_OTHER): Payer: Self-pay | Admitting: Urology

## 2017-11-30 NOTE — Telephone Encounter (Signed)
RN called patient in which there was no answer at this time. Left a message, including clinic's phone number, for patient to call the clinic back.   E. Daryon Remmert RN [place]

## 2017-11-30 NOTE — Telephone Encounter (Signed)
Pt is calling, has upcoming surgery w/Dr Lucia Bitter on 12/20.  Pt asking to speak w/RN Haynes Dage about questions he has regarding surgery.    [phone]

## 2017-12-01 ENCOUNTER — Telehealth (HOSPITAL_BASED_OUTPATIENT_CLINIC_OR_DEPARTMENT_OTHER): Payer: Self-pay

## 2017-12-01 NOTE — Telephone Encounter (Signed)
LVM for pt to pick up & begin taking ABX today in preparation for surgery next Thurs., 12/20. Also mentioned pt to come in to clinic 12/19 for injection ABX. Will call later to verify that pt rec'd message

## 2017-12-01 NOTE — Telephone Encounter (Signed)
Apt scheduled with marianne per her request

## 2017-12-01 NOTE — Telephone Encounter (Signed)
Spoke to pt who states began pre-op Macrobid BID Monday, 12/10 & will come in to clinic at 2 PM Wed., 12/19 for nurse visit for Gentamycin 320 mg IM. Message routed to [place] staff to place nurse visit in [place] for Wed 12/19 at 2 PM.

## 2017-12-02 LAB — EMMI , PATIENT SATISFACTION: HOSPITAL VISIT EXPECTATIONS: EMMI Video Order Number: 18530885476

## 2017-12-06 ENCOUNTER — Ambulatory Visit: Payer: Medicaid Other | Attending: Urology

## 2017-12-06 VITALS — Wt 199.0 lb

## 2017-12-06 DIAGNOSIS — Z87448 Personal history of other diseases of urinary system: Principal | ICD-10-CM | POA: Insufficient documentation

## 2017-12-06 MED ORDER — GENTAMICIN SULFATE 40 MG/ML IJ SOLN
320.0000 mg | Freq: Once | INTRAMUSCULAR | Status: AC
Start: 2017-12-06 — End: 2017-12-06
  Administered 2017-12-06: 320 mg via INTRAMUSCULAR

## 2017-12-06 NOTE — Interdisciplinary (Signed)
Pt seen in clinic for pre-op ABX injection. Pt scheduled for DVIU &[facility] tomorrow at 1200 with Dr. Lucia Bitter. Gentamycin 320 mg given IM, Per Dr. Corky Sing order. All pre-op questions answered

## 2017-12-07 ENCOUNTER — Ambulatory Visit (HOSPITAL_BASED_OUTPATIENT_CLINIC_OR_DEPARTMENT_OTHER): Payer: Medicaid Other | Admitting: Anesthesiology

## 2017-12-07 ENCOUNTER — Ambulatory Visit (HOSPITAL_COMMUNITY): Payer: Medicaid Other | Admitting: Anesthesiology

## 2017-12-07 ENCOUNTER — Encounter (HOSPITAL_COMMUNITY): Admission: RE | Disposition: A | Discharge status: Home Health | Payer: Self-pay | Attending: Urology

## 2017-12-07 ENCOUNTER — Ambulatory Visit
Admission: RE | Admit: 2017-12-07 | Discharge: 2017-12-07 | Disposition: A | Discharge status: Home / Self Care | Payer: Medicaid Other | Attending: Urology | Admitting: Urology

## 2017-12-07 ENCOUNTER — Other Ambulatory Visit: Payer: Self-pay | Admitting: Urology

## 2017-12-07 DIAGNOSIS — M109 Gout, unspecified: Secondary | ICD-10-CM | POA: Insufficient documentation

## 2017-12-07 DIAGNOSIS — M130 Polyarthritis, unspecified: Secondary | ICD-10-CM | POA: Insufficient documentation

## 2017-12-07 DIAGNOSIS — F329 Major depressive disorder, single episode, unspecified: Secondary | ICD-10-CM | POA: Insufficient documentation

## 2017-12-07 DIAGNOSIS — Y836 Removal of other organ (partial) (total) as the cause of abnormal reaction of the patient, or of later complication, without mention of misadventure at the time of the procedure: Secondary | ICD-10-CM | POA: Insufficient documentation

## 2017-12-07 DIAGNOSIS — Z905 Acquired absence of kidney: Secondary | ICD-10-CM | POA: Insufficient documentation

## 2017-12-07 DIAGNOSIS — N319 Neuromuscular dysfunction of bladder, unspecified: Secondary | ICD-10-CM | POA: Insufficient documentation

## 2017-12-07 DIAGNOSIS — Z87442 Personal history of urinary calculi: Secondary | ICD-10-CM | POA: Insufficient documentation

## 2017-12-07 DIAGNOSIS — N9989 Other postprocedural complications and disorders of genitourinary system: Secondary | ICD-10-CM | POA: Insufficient documentation

## 2017-12-07 DIAGNOSIS — N289 Disorder of kidney and ureter, unspecified: Secondary | ICD-10-CM | POA: Insufficient documentation

## 2017-12-07 DIAGNOSIS — N320 Bladder-neck obstruction: Secondary | ICD-10-CM

## 2017-12-07 DIAGNOSIS — Z79899 Other long term (current) drug therapy: Secondary | ICD-10-CM | POA: Insufficient documentation

## 2017-12-07 DIAGNOSIS — Z435 Encounter for attention to cystostomy: Principal | ICD-10-CM | POA: Insufficient documentation

## 2017-12-07 SURGERY — CYSTOURETHROSCOPY, WITH DIRECT VISION INTERNAL URETHROTOMY
Anesthesia: General | Site: Urethra | Wound class: Class II (Clean Contaminated)

## 2017-12-07 MED ORDER — MEPERIDINE HCL 25 MG/ML IJ SOLN
12.5000 mg | INTRAMUSCULAR | Status: DC | PRN
Start: 2017-12-07 — End: 2017-12-07

## 2017-12-07 MED ORDER — DOCUSATE SODIUM 250 MG OR CAPS
250.0000 mg | ORAL_CAPSULE | Freq: Two times a day (BID) | ORAL | 0 refills | Status: DC
Start: 2017-12-07 — End: 2018-02-01

## 2017-12-07 MED ORDER — FENTANYL CITRATE (PF) 250 MCG/5ML IJ SOLN
INTRAMUSCULAR | Status: DC | PRN
Start: 2017-12-07 — End: 2017-12-07
  Administered 2017-12-07 (×4): 25 ug via INTRAVENOUS

## 2017-12-07 MED ORDER — ONDANSETRON HCL 4 MG/2ML IV SOLN
INTRAMUSCULAR | Status: DC | PRN
Start: 2017-12-07 — End: 2017-12-07
  Administered 2017-12-07: 4 mg via INTRAVENOUS

## 2017-12-07 MED ORDER — NALOXONE HCL 0.4 MG/ML IJ SOLN
0.1000 mg | INTRAMUSCULAR | Status: DC | PRN
Start: 2017-12-07 — End: 2017-12-07

## 2017-12-07 MED ORDER — FENTANYL CITRATE (PF) 100 MCG/2ML IJ SOLN
50.0000 ug | INTRAMUSCULAR | Status: DC | PRN
Start: 2017-12-07 — End: 2017-12-07

## 2017-12-07 MED ORDER — FENTANYL CITRATE (PF) 100 MCG/2ML IJ SOLN
25.0000 ug | INTRAMUSCULAR | Status: DC | PRN
Start: 2017-12-07 — End: 2017-12-07

## 2017-12-07 MED ORDER — OXYBUTYNIN CHLORIDE 5 MG OR TABS
ORAL_TABLET | ORAL | 0 refills | Status: AC
Start: 2017-12-07 — End: 2018-12-07

## 2017-12-07 MED ORDER — DEXAMETHASONE SODIUM PHOSPHATE 4 MG/ML IJ SOLN (CUSTOM)
INTRAMUSCULAR | Status: DC | PRN
Start: 2017-12-07 — End: 2017-12-07
  Administered 2017-12-07 (×2): 6 mg via INTRAVENOUS

## 2017-12-07 MED ORDER — ACETAMINOPHEN 10 MG/ML IV SOLN
INTRAVENOUS | Status: DC | PRN
Start: 2017-12-07 — End: 2017-12-07
  Administered 2017-12-07: 1000 mg via INTRAVENOUS

## 2017-12-07 MED ORDER — OXYBUTYNIN CHLORIDE 5 MG OR TABS
5.0000 mg | ORAL_TABLET | Freq: Three times a day (TID) | ORAL | 0 refills | Status: DC | PRN
Start: 2017-12-07 — End: 2018-02-01

## 2017-12-07 MED ORDER — SODIUM CHLORIDE 0.9 % IV SOLN
INTRAVENOUS | Status: DC | PRN
Start: 2017-12-07 — End: 2017-12-07
  Administered 2017-12-07: 1000 mg via INTRAVENOUS

## 2017-12-07 MED ORDER — LACTATED RINGERS IV SOLN
INTRAVENOUS | Status: DC | PRN
Start: 2017-12-07 — End: 2017-12-07
  Administered 2017-12-07: 13:00:00 via INTRAVENOUS

## 2017-12-07 MED ORDER — PROPOFOL 200 MG/20ML IV EMUL
INTRAVENOUS | Status: DC | PRN
Start: 2017-12-07 — End: 2017-12-07
  Administered 2017-12-07 (×2): 300 mg via INTRAVENOUS

## 2017-12-07 MED ORDER — LACTATED RINGERS IV SOLN
INTRAVENOUS | Status: DC
Start: 2017-12-07 — End: 2017-12-07

## 2017-12-07 MED ORDER — DOCUSATE SODIUM 250 MG OR CAPS
250.00 mg | ORAL_CAPSULE | ORAL | 0 refills | Status: DC
Start: 2017-12-07 — End: 2018-04-10

## 2017-12-07 MED ORDER — ONDANSETRON HCL 4 MG/2ML IV SOLN
4.0000 mg | Freq: Once | INTRAMUSCULAR | Status: DC | PRN
Start: 2017-12-07 — End: 2017-12-07

## 2017-12-07 MED ORDER — PHENAZOPYRIDINE HCL 100 MG OR TABS
100.00 mg | ORAL_TABLET | ORAL | 0 refills | Status: DC
Start: 2017-12-07 — End: 2018-06-24

## 2017-12-07 MED ORDER — HYDROCODONE-ACETAMINOPHEN 5-325 MG OR TABS
1.00 | ORAL_TABLET | ORAL | 0 refills | Status: AC
Start: 2017-12-07 — End: 2018-06-05

## 2017-12-07 MED ORDER — MITOMYCIN 5 MG IV SOLR
4.00 mg | Freq: Once | INTRAVENOUS | Status: AC
Start: 2017-12-07 — End: 2017-12-07
  Administered 2017-12-07: 4 mg via INTRALESIONAL
  Filled 2017-12-07: qty 4

## 2017-12-07 MED ORDER — OXYCODONE HCL 5 MG OR TABS
5.0000 mg | ORAL_TABLET | Freq: Once | ORAL | Status: DC | PRN
Start: 2017-12-07 — End: 2017-12-07

## 2017-12-07 MED ORDER — LIDOCAINE HCL (CARDIAC) 20 MG/ML IV SOLN
INTRAVENOUS | Status: DC | PRN
Start: 2017-12-07 — End: 2017-12-07
  Administered 2017-12-07: 100 mg via INTRAVENOUS

## 2017-12-07 MED ORDER — HYDROCODONE-ACETAMINOPHEN 5-325 MG OR TABS
1.0000 | ORAL_TABLET | Freq: Four times a day (QID) | ORAL | 0 refills | Status: DC | PRN
Start: 2017-12-07 — End: 2018-02-01

## 2017-12-07 MED ORDER — PHENAZOPYRIDINE HCL 100 MG OR TABS
100.0000 mg | ORAL_TABLET | Freq: Three times a day (TID) | ORAL | 0 refills | Status: DC | PRN
Start: 2017-12-07 — End: 2018-02-01

## 2017-12-07 MED FILL — HYDROCODONE/APAP TAB 5-325 MG: MG | 3 days supply | Qty: 12 | Fill #0

## 2017-12-07 MED FILL — OXYBUTYNIN CHLORIDE TAB 5 MG: MG | 5 days supply | Qty: 15 | Fill #0

## 2017-12-07 MED FILL — DOCUSATE CALCIUM CAP 250 MG: MG | 10 days supply | Qty: 20 | Fill #0

## 2017-12-07 MED FILL — PHENAZOPYRIDINE HCL TAB 100 MG: MG | 9 days supply | Qty: 20 | Fill #0

## 2017-12-07 SURGICAL SUPPLY — 18 items
BAG CYSTO DRAIN SKYTRON TABLE (Misc Surgical Supply) ×2 IMPLANT
BAG URINE DRAIN 200Ml (Drains/Catheter/Tubes/Reservoir) ×2
CATHETER FOLEY SILASTIC 16FR 5CC (Lines/Drains) ×2 IMPLANT
CATHETER FOLEY SILASTIC 18FR 5CC (Lines/Drains) ×2
CATHETER URETHRAL 5FR X 70CM OPEN ENDED (Drains/Catheter/Tubes/Reservoir) ×2 IMPLANT
CONTAINER PRECISION SPECIMEN, 4OZ- STERILE (Misc Medical Supply) ×2 IMPLANT
DRAIN BAG URINE 2000 ML (Drains/Catheter/Tubes/Reservoir) ×2 IMPLANT
GLOVE SURGICAL BIOGEL SIZE 6.5 (Gloves/gowns) ×8 IMPLANT
GOWN MICRO COOL LG BLUE, AAMI LVL 4 (Gloves/Gowns) ×4
GUIDEWIRE SENSOR DUAL FLEX STRAIGHT TIP .035 X 150CM NITINOL (Procedural wires/sheaths/catheters/balloons/dilators) ×2
NEEDLE SIDEKICK RIGID 14.6  INCH   21 GA (Needles/punch/cannula/biopsy) ×4
PAD GROUND VALLEYLAB REM ADULT E7507 (Misc Surgical Supply) ×2
SCALPEL URETHROTOME 20.5FR, LANCET 8667.98 (Disp Instruments) ×2 IMPLANT
SLEEVE SCD KNEE MEDIUM (Misc Medical Supply) ×2 IMPLANT
SOLUTION IRR BAG .9% N/S 3000ML (Non-Pharmacy Meds/Solutions) ×4
SOLUTION IRR POUR BTL H20 1000ML (Non-Pharmacy Meds/Solutions) ×2 IMPLANT
SURGICAL PACK CYSTO - SAME DAY (Procedure Packs/kits) ×2 IMPLANT
TUBING SUCTION MEDI-VAC 9/32" X 20' (Tubing/suction) ×2

## 2017-12-07 NOTE — Anesthesia Preprocedure Evaluation (Addendum)
ANESTHESIA PRE-OPERATIVE EVALUATION    Patient Information    Name: Stephen [name]    MRN: [id number]    DOB: Dec 11, 1951    Age: 66 year old    Sex: male  Procedure(s):  CYSTOURETHROSCOPY, WITH DIRECT VISION INTERNAL URETHROTOMY & Injection of Mitomycin C      Pre-op Vitals:   There were no vitals taken for this visit.        Primary language spoken:  English    ROS/Medical History:           General:  positive for Obesity,   Cardiovascular:  hypertension,     Anesthesia History:  negative anesthesia history ROS   Pulmonary:   negative pulmonary ROS     Neuro/Psych:   negative neuro/psych ROS   Hematology/Oncology:   hematologic/lymphatic negative      GI/Hepatic:  negative GI/hepatic ROS Infectious Disease:  negative for infectious disease     Renal:  negative renal ROS  Bladder neck contracture, urethral stricture Endocrine/Other:  arthritis,      Pregnancy History:   Pediatrics:    negative pediatric ROS       Pre Anesthesia Testing (PCC/CPC) notes/comments:                 Physical Exam    Airway:  Inter-inciser distance > 4 cm  Prognanth Able    Mallampati: II  Neck ROM: full  TM distance: > 6 cm  Short thick neck: No        Cardiovascular:  - cardiovascular exam normal         Pulmonary:  - pulmonary exam normal           Neuro/Neck/Skeletal/Skin:  - [place] ANE PHYS EXAM NEGATIVE ROS SKIN SKELETAL NEURO NECK          Dental:      Abdominal:      General: obesity   Abdomen: soft.     Additional Clinical Notes:               Last  OSA (STOP BANG) Score:  No Data Recorded    Last OSA  (STOP) Score for   No Data Recorded                 Past Medical History:   Diagnosis Date    Chronic suprapubic catheter (CMS-HCC)     Congenital hydronephrosis     Gout     Headache     Hematuria     Kidney disease     Kidney stones     Major depressive disorder, single episode     Polyarthropathy or polyarthritis of multiple sites     Urethral stricture      Past Surgical History:   Procedure Laterality Date    APPENDECTOMY       CT INSERTION OF SUPRAPUBIC CATH  09/25/2015    CYSTOSCOPY      CYSTOSCOPY W/ LASER LITHOTRIPSY      NEPHRECTOMY Right 1995    OTHER SURGICAL HISTORY      Interstim 01/29/2011     Social History     Tobacco Use    Smoking status: Never Smoker    Smokeless tobacco: Never Used   Substance Use Topics    Alcohol use: Not on file    Drug use: Not on file       No current facility-administered medications for this encounter.      Allergies   Allergen Reactions  Sulfa Drugs Unspecified       Labs and Other Data  Lab Results   Component Value Date    NA 140 02/28/2017    K 4.4 02/28/2017    CL 100 02/28/2017    BICARB 25 02/28/2017    BUN 22 02/28/2017    CREAT 1.16 02/28/2017    GLU 108 (H) 02/28/2017    [place] 9.3 02/28/2017     No results found for: AST, ALT, GGT, LDH, ALK, TP, ALB, TBILI, DBILI  No results found for: WBC, RBC, HGB, HCT, MCV, MCHC, RDW, PLT, PLCTEL, MPV, MPVH, SEG, LYMPHS, MONOS, EOS, BASOS  No results found for: INR, PTT  No results found for: ARTPH, ARTPO2, ARTPCO2    Anesthesia Plan:  Risks and Benefits of Anesthesia  I personally examined the patient immediately prior to the anesthetic and reviewed the pertinent medical history, drug and allergy history, laboratory and imaging studies and consultations. I have determined that the patient has had adequate assessment and testing.    Anesthetic techniques, invasive monitors, anesthetic drugs for induction, maintenance and post-operative analgesia, risks and alternatives have been explained to the patient and/or patient's representatives.    I have prescribed the anesthetic plan:         Planned anesthesia method: General         ASA 2 (Mild systemic disease)     Potential anesthesia problems identified and risks including but not limited to the following were discussed with patient and/or patient's representative: Adverse or allergic drug reaction, Administration of blood products, Recall, Ocular injury, Dental injury or sore throat, Nerve  injury, Injury to brain, heart and other organs and Death    No Beta Blocker Indicated: Patient not on beta blockersPlanned monitoring method: Routine monitoring    Informed Consent:  Anesthetic plan and risks discussed with Patient.    Plan discussed with Surgeon.

## 2017-12-07 NOTE — Anesthesia Postprocedure Evaluation (Signed)
Anesthesia Transfer of Care Note    Patient: Stephen [name]    Procedures performed: Procedure(s):  CYSTOURETHROSCOPY, WITH DIRECT VISION INTERNAL URETHROTOMY & Injection of Mitomycin C    Vital signs: stable           Anesthesia Post Note    Patient: Stephen [name]    Procedure(s) Performed: Procedure(s):  CYSTOURETHROSCOPY, WITH DIRECT VISION INTERNAL URETHROTOMY & Injection of Mitomycin C      Final anesthesia type: General    Patient location: PACU    Post anesthesia pain: adequate analgesia    Mental status: awake, alert  and oriented    Airway Patent: Yes    Last Vitals:   Vitals:    12/07/17 1500   BP: 121/64   Pulse: 56   Resp: 11   Temp:    SpO2: 93%       Post vital signs: stable    Hydration: adequate    N/V:no    Anesthetic complications: no    Plan of care per primary team.

## 2017-12-07 NOTE — H&P (Signed)
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     Patient has been seen by Dr. Reece Levy in clinic, had concerns for bladder dysfunction, therefore VUDS was completed     UDS shows good storage phase however he was not able to initiate a void. PVR 500 ccs,  - RUG showed no anterior urethra strictures  - VUDS report shows that bladder neck was closed with straining. I wonder if this is secondary to the [facility]  - VUDS also didn't show VUR. His mild hydro may be physiologic since there was no obstruction on CT done on 09/14/16 and he is asymptomatic.      He has had numerous surgeries     Lithotripsy 2005, 2006, 2007  Interstim 2012- Has not been working- Patient concerned with left thigh pain, thinks associated with SNS  SPT placement10/06/2015  Last SPT change 08/23/2016 in the ER    History per medical records from [facility] a few hours after his visit is as follows:    Cysto SPT 09/25/2015: Mild BNC  ? Prostate surgery in the past    Currently denies The patient denies gross hematuria, dysuria, fevers, chills, night sweats, abdominal pain, flank pain, pelvic pain, nausea, vomiting, diarrhea, chest pain, shortness of breath, feeling light-headed or dizzy, unintentional weight loss, bone pain, or changes in lower extremity sensation.       Has been taking Macrobid since Monday, received IM gentamicin in clinic yesterday.       ROS:  Per HPI    Past Medical and Surgical History:  Past Medical History:   Diagnosis Date    Chronic suprapubic catheter (CMS-HCC)     Congenital hydronephrosis     Gout     Headache     Hematuria     Kidney disease     Kidney stones     Major depressive disorder, single episode        Polyarthropathy or polyarthritis of multiple sites     Urethral stricture      Past Surgical History:   Procedure Laterality Date    APPENDECTOMY      CT INSERTION OF SUPRAPUBIC CATH  09/25/2015    CYSTOSCOPY      CYSTOSCOPY W/ LASER LITHOTRIPSY      NEPHRECTOMY Right 1995    OTHER SURGICAL HISTORY      Interstim 01/29/2011       Allergies:  Allergies   Allergen Reactions    Sulfa Drugs Unspecified       Medications:  No current facility-administered medications on file prior to encounter.      Current Outpatient Medications on File Prior to Encounter   Medication Sig Dispense Refill    allopurinol (ZYLOPRIM) 300 MG tablet Take 300 mg by mouth daily.      amLODIPINE (NORVASC) 5 MG tablet Take 10 mg by mouth daily.      atenolol (TENORMIN) 25 MG tablet Take 25 mg by mouth daily.      ibuprofen (MOTRIN) 200 MG tablet Take 200 mg by mouth every 6 hours as needed for Mild Pain (Pain Score 1-3).      Ibuprofen-Diphenhydramine Cit (ADVIL PM  PO) as needed (1 qhs).      meloxicam (MOBIC) 7.5 MG tablet        tizanidine (ZANAFLEX) 4 MG capsule Take 4 mg by mouth 3 times daily.         Social History:  Social History     Socioeconomic History    Marital status: Single     Spouse name: Not on file    Number of children: Not on file    Years of education: Not on file    Highest education level: Not on file   Occupational History    Not on file   Tobacco Use    Smoking status: Never Smoker    Smokeless tobacco: Never Used   Substance and Sexual Activity    Alcohol use: Not on file    Drug use: Not on file    Sexual activity: No   Social Activities of Daily Living Present    Not on file   Social History Narrative    Not on file       Family History:  Family History   Adopted: Yes   Family history unknown: Yes       ----------------------------------------------------------------------------------------------    Physical Exam:  No Data Recorded    No intake/output data recorded.     Gen-   NAD, alert  and oriented   Pulm-   Non-labored on room air   Abd/GU- Soft, NT, ND. GU deferred to OR.      Labs:   No results found for: WBC, RBC, HGB, HCT, MCV, MCHC, RDW, PLT, MPV    Lab Results   Component Value Date    NA 140 02/28/2017    K 4.4 02/28/2017    CL 100 02/28/2017    BICARB 25 02/28/2017    BUN 22 02/28/2017    CREAT 1.16 02/28/2017    GLU 108 (H) 02/28/2017    [place] 9.3 02/28/2017       No results found for: COLORUA, APPEARUA, GLUCOSEUA, BILIUA, KETONEUA, SGUA, BLOODUA, PHUA, PROTEINUA, UROBILUA, NITRITEUA, LEUKESTUA, WBCUA, RBCUA, EPITHCELLSUA, HYALINEUA, CRYSTALSUA, COMMENTSUA    No results found for: INR, PTT       Microbiology:  UCX 11/16/17  ESBL Escherichia coli   >100,000 colonies/mL   Produces Extended Spectrum Beta-Lactamase   Identification performed by [employer] [facility]( Maldi-ToF). This test   was developed and its performance characteristics determined by [facility]. It has not been cleared or   approved by the U.S. Food and Drug Administration. The FDA has   determined that such clearance or approval is not necessary.   CALM   Urine Culture (Abnormal) Abnormal   Staphylococcus aureus   >100,000 colonies/mL   Identification performed by Mass Spectrometry( Maldi-ToF). This test   was developed and its performance characteristics determined by [facility]. It has not been cleared or   approved by the U.S. Food and Drug Administration. The FDA has   determined that           ______________________________________________________________________  Assessment and Plan:  66yo M with complex urologic history with [place] s/p TURP.   - to OR for DVIU w/ [facility]    Staffed w/ attending, Dr. Lucia Bitter.  --Margaretha Sheffield, MD  Urology PGY-5  [phone]

## 2017-12-07 NOTE — Discharge Instructions (Signed)
Please remove your urethral catheter on Sunday morning. This is the catheter from your penis that is plugged. Do not remove your suprapubic catheter.    Post operative medications  -You may take pyridium for burning with urination. Be advised, it will make your urine [place]; this is normal.  -You may take oxybutynin for bladder spasm or pain. It may cause dry mouth and constipation.  -You may take norco as needed for pain. Once your pain improves, you may switch to tylenol.  -You should take colace twice per day while you're taking norco or any other narcotic.    Discharge Instructions  Some patients are sent home with a foley catheter (a hollow tube which drains urine from your bladder), while others go home urinating on their own. If you still have a catheter, you will be provided with instructions regarding its care.   It is common to have blood in the urine after your procedure. It may be pink or even red; inform your doctor if you have a significant amount of clots in the urine or if you are unable to urinate at all. Be sure to drink plenty of fluids at all times.   1. It is not uncommon to have some burning when you urinate or to even notice some stone fragments in your urine.   2. You may have an internal stent (a hollow tube that runs from the kidney to your bladder) after your procedure, helping the urine to drain down from the kidney to your bladder after your surgery. Some patients do not notice that they have a stent, while others complain of the sensation of needing to urinate frequently, burning on urination, or even some back pain (especially when they go to urinate). These sensations usually improve gradually, some faster than others. This is not uncommon, but may initially warrant the use of pain medication, which you were prescribed. While the stent is in place, your urine may continue to be bloody. Your urologist as an outpatient will remove this stent.   3. Drink at least 6-8 glasses of fluid per  day; minimize night-time drinking if this causes you to awaken regularly to urinate   4. You may resume your regular diet and regular medication regimen.   5. You may shower or bathe.   6. You will be given a prescription for pain medication; continue using this as long as your pain persists. As soon as Extra Strength Tylenol is adequate, you can switch to this instead - it is less constipating. If you have severe pain that does not improve with the pain medication or you have persistent vomiting, call your doctor.   7. As you have just underwent general anesthesia, you should refrain from driving, heavy lifting, smoking, alcohol consumption, or important decision   making for the next 24 hours. You may climb stairs and you may resume sexual activity.   8. Call your physician if you have a fever over 101F.   9. Make a follow up appointment with your urologist when you arrive home (or the next business day).   10. Call your urologist during normal business hours with any other questions.    Urology Contact Information:     If you have any questions about your hospital care, your medications, or if you have new or concerning symptoms soon after going home, and you need to contact your hospital physician, your hospital physician can be contacted in the following manner:      Dr. Edrick Oh  RN is Tera Mater [phone]). Her surgery scheduler is [facility] [phone]).    Clinic appointments: [phone]    Business Hours (Monday-Friday; 08:00AM - 4:30PM; Non-Urgent):  [facility] Office phone at [phone] or [phone]  [facility] Office phone at [phone]    After-Hours, Weekends/Holidays:  FOR EMERGENCY ISSUES ONLY, call [phone] and ask them to page the on-call urologist. If it is not an emergency, then wait for business hours and call the number above.    If your clinic appointment has not been scheduled before you leave the hospital and you do not receive any call  within 2 business days, call the clinic scheduler for your urologist's clinic to schedule the appointment.

## 2017-12-07 NOTE — Brief Op Note (Signed)
UROLOGY BRIEF OPERATIVE NOTE    CASE ID: [id number]    DATE: 12/07/2017  TIME: 2:27 PM    PREOPERATIVE DIAGNOSIS: bladder neck contracture    POSTOPERATIVE DIAGNOSIS: same    PROCEDURE:   Procedure(s):  CYSTOURETHROSCOPY, WITH DIRECT VISION INTERNAL URETHROTOMY & Injection of Mitomycin C    Primary: Stephen Artist, MD  Resident - Assisting: Sherlynn Stalls, MD    ANESTHESIA: general    Anesthesiologist: Aggie Hacker, MD     OR Staff:  Circulator: Clare Charon.; Philipp Deputy, RN  Scrub: Solmon Ice    FINDINGS: almost completely obliterated bladder neck though we were able to pass a wire, confirmed by cystoscopy via suprapubic channel. DVIU complete with 4 cuts. Mitomycin C injected in each groove. 63F catheter placed in urethra. SP tube changed for new 89F catheter. Urethral catheter plugged.    WOUND CLASSIFICATION:  Procedure(s):  CYSTOURETHROSCOPY, WITH DIRECT VISION INTERNAL URETHROTOMY & Injection of Mitomycin C - Wound Class: Class II (Clean Contaminated) - Incision Closure: N/A    WOUND CLOSURE STATUS:  Procedure(s):  CYSTOURETHROSCOPY, WITH DIRECT VISION INTERNAL URETHROTOMY & Injection of Mitomycin C - Wound Class: Class II (Clean Contaminated) - Incision Closure: N/A    SPECIMENS:  * No specimens in log *    Fluids/Blood Products:      IV Fluids: 500 ml crystalloid    Blood Products: 0    EBL: 20 ml    Urine Output: unmeasured    COMPLICATIONS: none    DISPOSITION:  Extubated in the OR, brought to PACU in stable condition  Ok to DC pending evaluation of hematuria  Will DC his urethral catheter on Sunday AM  Urology nursing visit in 4 weeks for SP tube change  Will RTC 8 weeks with cysto    Event Time In Time Out   In Facility 0934    Pre Procedure Start 1106    Pre Procedure Criteria Complete 1240    Surgeon Ready 1405    Room Ready 1411    In Room 1244    Incision 1326    Closing Started 1400    Closing Complete     Out of Room     In PACU     PACU Criteria Complete     In  Recovery     Recovery Criteria Complete     Ready For Visitors     Anesthesia Start 9861    Anesthesia Ready 1301    Anesthesia Stop 1417    Epidural to C-Section     Regional Anesthesia Start     Regional Anesthesia Stop     Regional Block Administered

## 2017-12-08 ENCOUNTER — Telehealth (HOSPITAL_BASED_OUTPATIENT_CLINIC_OR_DEPARTMENT_OTHER): Payer: Self-pay

## 2017-12-08 ENCOUNTER — Telehealth (HOSPITAL_BASED_OUTPATIENT_CLINIC_OR_DEPARTMENT_OTHER): Payer: Self-pay | Admitting: Urology

## 2017-12-08 ENCOUNTER — Encounter: Payer: Self-pay | Admitting: Hospital

## 2017-12-08 NOTE — Telephone Encounter (Signed)
Spoke with pt and scheduled both appts as requested. No further action needed, closing encounter.

## 2017-12-08 NOTE — Telephone Encounter (Signed)
Attempted to contact pt to schedule Post Op for Dr Lucia Bitter. Pt unavailable, LVM for pt to CB to schedule.      Call Center-When pt calls back, please transfer to me. [phone]. If unable to reach me, please refer to surgery scheduler Jamesetta So.

## 2017-12-08 NOTE — Telephone Encounter (Signed)
Pt is calling requesting to scheduled his post op appt. Per Dr. Maralyn Sago notes - Will RTC 8 weeks with cysto.    Attempt to contact FD - no answer.    Please review and return call back to pt at [phone] to further assist.

## 2017-12-08 NOTE — Telephone Encounter (Signed)
-----   Message from Sherlynn Stalls, MD sent at 12/07/2017  2:36 PM PST -----  Regarding: Lucia Bitter post op  Please schedule nursing visit in 4 weeks for SP tube exchange.    Please schedule cysto with Dr. Lucia Bitter in 8 weeks.        Thanks  Darrold Span

## 2017-12-08 NOTE — Op Note (Signed)
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     PROCEDURE PERFORMED:    1.  Complex cystoscopy.   2.  Incision of bladder neck with injection of Mitomycin-C into the  area.   3.  Suprapubic tube change.    SURGEON/STAFF:  Jolaine Artist, MD    ASSISTANT:  Dr. Darrold Span.    ANESTHESIA:  General.    CLINICAL INDICATIONS:  This is a gentleman who has had a known  complete obliteration of his bladder neck after TURP.  He is  currently managed with a suprapubic tube.  Urodynamics showed he was  unable to void to assess his bladder function.  He was counseled  about undergoing an incision of the bladder neck with placement of  Mitomycin-C in order to stabilize the area.  He understands this is  an off-label use of the medication.     After being well-informed, he wished to proceed forward.    OPERATIVE FINDINGS:  Near-complete obliteration.  We were able to get  a 0.035 wire through a tiny opening into the bladder.  We removed his  suprapubic tube in an antegrade fashion and placed the scope in the  bladder to confirm we were in the right location.        With this point, we did change the suprapubic tube sterilely on the  field, replaced it with a 16 green catheter with 10 cc of water in  the balloon.  The cystoscope was then brought back into the urethra.  We placed a DVIU camera into the urethra and were able to make  incisions at 2 o'clock, 4 o'clock, 8 o'clock, and 10 o'clock down to  healthy, bleeding tissue, or relatively healthy.  There was at least  blood present.  We injected approximately 7-8 cc of mitomycin-C in  the each of the grooves, 2 cc in each groove.  It was more  challenging at the 2 and 10 o'clock locations due to his anatomy.  We  were proximal to the external urinary sphincter at all times.  At the  end of the case, we placed an 18 catheter  across the urethra and  capped it.     He can take the 18 catheter out in 3-5 days.  He can do this on his  own.  He will keep the suprapubic tube in place until we can  demonstrate he can void.       Job #:  [id number]

## 2017-12-08 NOTE — Telephone Encounter (Signed)
LVM for pt that enquiring as to pt's post-op condition.

## 2017-12-08 NOTE — Telephone Encounter (Signed)
From: Mickle Mallory  To: Wyn Quaker, DO  Sent: 12/08/2017 11:18 AM PST  Subject: 20-Other    This is Stephen [name]/[id number] I still do my SPT change at [facility] you are not there anymore? Have been seeing Dr.Buckley for the procedures we talked about

## 2017-12-08 NOTE — Telephone Encounter (Signed)
Matter resolved. Please see previous encounter.

## 2017-12-11 ENCOUNTER — Other Ambulatory Visit: Payer: Self-pay | Admitting: Family Medicine

## 2017-12-14 ENCOUNTER — Ambulatory Visit (HOSPITAL_BASED_OUTPATIENT_CLINIC_OR_DEPARTMENT_OTHER): Payer: Medicaid Other

## 2018-01-04 ENCOUNTER — Ambulatory Visit: Payer: Medicaid Other | Attending: Urology

## 2018-01-04 ENCOUNTER — Ambulatory Visit (HOSPITAL_BASED_OUTPATIENT_CLINIC_OR_DEPARTMENT_OTHER): Payer: Medicaid Other

## 2018-01-04 ENCOUNTER — Telehealth (HOSPITAL_BASED_OUTPATIENT_CLINIC_OR_DEPARTMENT_OTHER): Payer: Self-pay

## 2018-01-04 DIAGNOSIS — N35013 Post-traumatic anterior urethral stricture: Principal | ICD-10-CM | POA: Insufficient documentation

## 2018-01-04 NOTE — Telephone Encounter (Signed)
Noted, closing encounter.

## 2018-01-04 NOTE — Interdisciplinary (Signed)
Catheter Change:    Patient presents to clinic for routine catheter change, per standing order from MD. Patientambulatory to clinic and in no acute distress. No new sx or concerns reported.    Chaperone offered, patient declined     Preparation:  Allergies verified with patient  Procedure explained  Patient verbalized understanding w/o objections  Patient positioned and prepped for procedure    Procedure:  16 Fr indwelling catheter discontinued with no difficulty after fully deflating cath balloon - 10 mls of water aspirated from cath balloon. Patient tolerated well with minimal discomfort. Under sterile technique patient prepped with betadine. 16 Fr indwelling catheter inserted into bladder with no difficulty. 10 mls of sterile water instilled within cath balloon. Bladder irrigated with 70 ml sterile water. Clear, yellow urine drained.   Patient tolerated procedure well with no discomfort. Connected catheter to bag to drain by gravity. Patient stable at discharge.

## 2018-01-04 NOTE — Telephone Encounter (Signed)
Pt is returning a call back to [place], pt is no longer having symptoms and said to disregard testing, pt will callback  If anything occurs.

## 2018-01-09 ENCOUNTER — Telehealth (HOSPITAL_BASED_OUTPATIENT_CLINIC_OR_DEPARTMENT_OTHER): Payer: Self-pay | Admitting: Urology

## 2018-01-09 NOTE — Telephone Encounter (Signed)
Pt is calling to follow up and states he was suppose to receive a call to schedule an appt with Dr. Neita Goodnight.    Pt wants to know if Dr. Lucia Bitter placed a referral? Pt states Dr. Lucia Bitter recommended Dr. Neita Goodnight to do a interstim removal/ procedure.    Pt also mentioned he had discussed this with nurse, Haynes Dage and is requesting to speak with a nurse.    Please review and advise. Return call back to pt at [phone] to further assist and discuss.

## 2018-01-15 ENCOUNTER — Telehealth (HOSPITAL_BASED_OUTPATIENT_CLINIC_OR_DEPARTMENT_OTHER): Payer: Self-pay | Admitting: Anesthesiology

## 2018-01-15 ENCOUNTER — Encounter (HOSPITAL_BASED_OUTPATIENT_CLINIC_OR_DEPARTMENT_OTHER): Payer: Self-pay | Admitting: Pain Medicine

## 2018-01-15 DIAGNOSIS — M5412 Radiculopathy, cervical region: Secondary | ICD-10-CM

## 2018-01-15 DIAGNOSIS — M5416 Radiculopathy, lumbar region: Principal | ICD-10-CM

## 2018-01-15 NOTE — Telephone Encounter (Signed)
Pt is requesting to speak with Nurse in regards to his cervical MRI. Per pt he is not able to do MRI so, he can have his CESI. Pt's sciatica has been getting worse and he is having difficulty walking, burning sensation and he is asking for possible a referral to a specialist and possible do a MRI. Please advise.

## 2018-01-22 ENCOUNTER — Telehealth (HOSPITAL_BASED_OUTPATIENT_CLINIC_OR_DEPARTMENT_OTHER): Payer: Self-pay

## 2018-01-22 ENCOUNTER — Telehealth (HOSPITAL_BASED_OUTPATIENT_CLINIC_OR_DEPARTMENT_OTHER): Payer: Self-pay | Admitting: Urology

## 2018-01-22 ENCOUNTER — Other Ambulatory Visit (HOSPITAL_BASED_OUTPATIENT_CLINIC_OR_DEPARTMENT_OTHER): Payer: Self-pay | Admitting: Anesthesiology

## 2018-01-22 DIAGNOSIS — R339 Retention of urine, unspecified: Secondary | ICD-10-CM

## 2018-01-22 DIAGNOSIS — Z419 Encounter for procedure for purposes other than remedying health state, unspecified: Principal | ICD-10-CM

## 2018-01-22 NOTE — Telephone Encounter (Signed)
Pt is calling to request an order for catheter change.   1. 2 x 2  2. Stat locks  3. Tape   Please advise.     [phone]

## 2018-01-22 NOTE — Telephone Encounter (Signed)
LVM for pt to call nurse direct # as not clear what pt is requesting.

## 2018-01-22 NOTE — Telephone Encounter (Signed)
Spoke to pt who is S/P DVIU & [facility] 12/202018. Has SPT until he demonstrates that he is able to void. Has app't with Dr. Lucia Bitter 2/13 for cysto. In the meantime, pt asking for order to be placed for 2X2's, Statlocks, & tape for SPT care. Order pended for Dr. Corky Sing sig.

## 2018-01-22 NOTE — Telephone Encounter (Signed)
Spoke to pt who gets his SPT changed monthly with [facility]. Sees Dr. Lucia Bitter for

## 2018-01-23 ENCOUNTER — Ambulatory Visit: Payer: Medicaid Other | Attending: Anesthesiology

## 2018-01-23 ENCOUNTER — Encounter (HOSPITAL_BASED_OUTPATIENT_CLINIC_OR_DEPARTMENT_OTHER): Admission: RE | Disposition: A | Discharge status: Home Health | Payer: Self-pay | Attending: Anesthesiology

## 2018-01-23 ENCOUNTER — Ambulatory Visit
Admission: RE | Admit: 2018-01-23 | Discharge: 2018-01-23 | Disposition: A | Discharge status: Home / Self Care | Payer: Medicaid Other | Attending: Anesthesiology | Admitting: Anesthesiology

## 2018-01-23 DIAGNOSIS — M5412 Radiculopathy, cervical region: Principal | ICD-10-CM | POA: Insufficient documentation

## 2018-01-23 DIAGNOSIS — N35919 Unspecified urethral stricture, male, unspecified site: Secondary | ICD-10-CM | POA: Insufficient documentation

## 2018-01-23 DIAGNOSIS — Z882 Allergy status to sulfonamides status: Secondary | ICD-10-CM | POA: Insufficient documentation

## 2018-01-23 DIAGNOSIS — Z419 Encounter for procedure for purposes other than remedying health state, unspecified: Secondary | ICD-10-CM

## 2018-01-23 DIAGNOSIS — Q620 Congenital hydronephrosis: Secondary | ICD-10-CM | POA: Insufficient documentation

## 2018-01-23 DIAGNOSIS — N289 Disorder of kidney and ureter, unspecified: Secondary | ICD-10-CM | POA: Insufficient documentation

## 2018-01-23 DIAGNOSIS — M130 Polyarthritis, unspecified: Secondary | ICD-10-CM | POA: Insufficient documentation

## 2018-01-23 DIAGNOSIS — Z87442 Personal history of urinary calculi: Secondary | ICD-10-CM | POA: Insufficient documentation

## 2018-01-23 DIAGNOSIS — F329 Major depressive disorder, single episode, unspecified: Secondary | ICD-10-CM | POA: Insufficient documentation

## 2018-01-23 SURGERY — PAIN ESI CERVICAL WITH IMAGING
Site: Neck

## 2018-01-23 MED ORDER — IOHEXOL 240 MG/ML IJ SOLN
INTRAMUSCULAR | Status: DC | PRN
Start: 2018-01-23 — End: 2018-01-23
  Administered 2018-01-23: 1 mL via EPIDURAL

## 2018-01-23 MED ORDER — DEXAMETHASONE SOD PHOSPHATE PF 10 MG/ML IJ SOLN
INTRAMUSCULAR | Status: DC
Start: 2018-01-23 — End: 2018-01-23
  Filled 2018-01-23: qty 1

## 2018-01-23 MED ORDER — IOHEXOL 240 MG/ML IJ SOLN
INTRAMUSCULAR | Status: DC
Start: 2018-01-23 — End: 2018-01-23
  Filled 2018-01-23: qty 10

## 2018-01-23 MED ORDER — DEXAMETHASONE SODIUM PHOSPHATE 10 MG/ML IJ SOLN (CUSTOM)
INTRAMUSCULAR | Status: DC | PRN
Start: 2018-01-23 — End: 2018-01-23
  Administered 2018-01-23: 5 mg via EPIDURAL

## 2018-01-23 SURGICAL SUPPLY — 12 items
APPLICATOR CHLORAPREP 3ML, CLEAR (Misc Medical Supply) ×2 IMPLANT
GLOVE BIOGEL PI ULTRATOUCH SIZE 7 (Gloves/gowns) ×2
GLOVE BIOGEL PI ULTRATOUCH SIZE 7.5 (Gloves/gowns) IMPLANT
GLOVE BIOGEL PI ULTRATOUCH SIZE 8 (Gloves/gowns)
MARKER SECURELINE SURG SKIN (Misc Medical Supply) ×2 IMPLANT
NEEDLE BD HYPO 27G X 1.25" (Needles/punch/cannula/biopsy) IMPLANT
NEEDLE EPIDURAL TUOHY 20G X 4.5" (Needles/punch/cannula/biopsy)
NEEDLE SPINE QUINCKE 23G X 3.5" (Needles/punch/cannula/biopsy) IMPLANT
NEEDLE SPINE QUINCKE 25G X 3.5" (Needles/punch/cannula/biopsy) IMPLANT
SYRINGE HYPO LL 10CC (Needles/punch/cannula/biopsy) IMPLANT
SYRINGE HYPO LL 20CC (Needles/punch/cannula/biopsy) IMPLANT
TRAY SINGLE SHOT EPIDURAL (Procedure Packs/kits) ×2 IMPLANT

## 2018-01-23 NOTE — H&P (Signed)
Ambulatory Surgery/Invasive Procedure History and Physical      Primary Care Physician [name]    Chief Complaint:  neck and arm pain     67 year old male here for a cervical ESI for pain control.    BP 151/84    Pulse 59    Temp 98.9 F (37.2 C)    Resp 12    SpO2 100%     Past Medical History:   Diagnosis Date    Chronic suprapubic catheter (CMS-HCC)     Congenital hydronephrosis     Gout     Headache     Hematuria     Kidney disease     Kidney stones     Major depressive disorder, single episode     Polyarthropathy or polyarthritis of multiple sites     Urethral stricture        Allergies   Allergen Reactions    Sulfa Drugs Unspecified       Current Facility-Administered Medications   Medication Dose Route Frequency Provider Last Rate Last Dose    dexamethasone (DECADRON) 10 MG/ML PF injection  - ADS OVERRIDE             iohexol (OMNIPAQUE 240) 240 MG/ML solution  - ADS OVERRIDE                I have reviewed the past medical history, allergies and current medications as documented in the electronic health record.      Physical Exam       Can this patient make their own healthcare decisions?  Yes    Chest:  Breaths easily     Heart:  RRR    Abdomen:  Soft    Pain Management Needs/Options discussed.    No Advanced Directives.      Resuscitative Status:  Full Code, Full Care    Diagnosis:    ICD-10-CM ICD-9-CM    1. Cervical radiculopathy M54.12 723.4     Added automatically from request for surgery [id number]   2. Surgery, elective Z41.9 V50.9        Procedure: Cervical epidural steroid injection    This is the first injection of this type.     Discussed Risks, Benefits, and Alternatives to procedure.  Questions answered.  Patient voiced understanding and wished to proceed. Consent Signed.    See procedure note of same date.

## 2018-01-23 NOTE — Telephone Encounter (Signed)
Needing advice regarding this.    Pt stopped by the office and would like to schedule interstim removal by Dr Neita Goodnight. Does pt need to be seen prior to removing? FD unsure as to how to schedule pt.    Please advise and route to front desk.    [phone] call to schedule patient-ok to leave a detailed message if no answer.

## 2018-01-23 NOTE — Op Note (Signed)
[facility] Note, [facility]    Preoperative Diagnosis: Cervical Radiculopathy    Postoperative Diagnosis: Cervical Radiculopathy    Procedure: 1. C7/T1 interlaminar cervical  epidural steroid injection    2.   Fluoroscopy for needle guidance.  3.  Cervical epidurography.     Surgeon/Staff: Ophelia Shoulder. [name], MD    Anesthesia:  Lidocaine 1%       Fluoroscopy Time:   8     seconds                                 Indications: The patient c/o  neck pain and arm apin. The patient's history and physical findings are consistent with Cervical Radiculopathy. They are here for an injection at the site thought to be the source of their pain. This is the first injection performed for this patient.    Procedure in detail:  Written informed consent was obtained.  The chart was reviewed, questions were answered and the patient wished to proceed. The patient had no contraindications to the procedure.  Vital signs were stable. Standard monitoring was applied.  The patient, the procedure nurse, and the physician confirmed the site of injection after an official "time out." The skin over the injection site was also marked and confirmed as the site of the injection.    Localization Time Out:  An additional intraoperative timeout, specifically to confirm accurate localization, was conducted by the attending physician.     The patient remained awake and alert throughout the procedure.  The patient was placed in the prone position.  The skin was prepped with chlorhexidine and sterile drapes were applied. The skin and soft tissues were anesthetized with Lidocaine 1%. A  20 gauge 3.5 inch Touhy needle epidural needle was inserted percutaneously and advanced under fluoroscopic guidance using  AP, and lateral projections. Using loss of resistance to preservative free normal saline and a left sided interlaminar approach, the C7/T1  posterior epidural space was entered after the lamina was contacted with the epidural needle.  No  paraesthesias occurred and aspiration was negative   Epidurography and radiographic interpretation: Epidurography was performed by injection of Omnipaque 240 under live fluoroscopy.  Multiple Xray images were obtained.  Radiologic examination confirmed proper spread of the contrast medium within the epidural space.  There was no evidence of intrathecal or intravascular runoff..    The needle was injected with Dexamethasone (10mg /mL)  0.5 mL and Preservative free normal saline 1.5 mL.  The needle was removed.  A sterile bandage was applied.  The patient did not experience any hemodynamic or neurologic sequelae.     The patient was transferred to the recovery area. Post operative instructions were explained and given to the  patient. The patient was discharged in stable health

## 2018-01-23 NOTE — Discharge Instructions (Signed)
Patient verbalized understanding of below instructions    Procedure Done Today: cervical epidural injection    Please follow-up in Pain Clinic to discuss issues    Post Procedure Instructions  Continue present medication unless otherwise indicated.  Resume your normal diet after being discharge.  Ice pack to treatment site (no more than 20 minutes at a time) if needed for pain relief and/or muscle spasm.  Avoid strenuous activities and driving until tomorrow morning.  If you have a band-aid dressing, you may remove it tomorrow morning.  Resume normal activities tomorrow morning, unless otherwise directed.  No submersion in water for 24 hrs.  If your next appointment is at the Pain Clinic, then please call [phone] to make an appointment.  If your next appointment is another procedure, then please call [phone] to make an appointment.    Call the [facility],  [phone] and ask for the pain management provider on call for ANY sign of:  Temperature above 101.5 F  Redness or drainage at the treatment site  Severe, uncontrollable headache    Call [place]

## 2018-01-30 NOTE — Progress Notes (Signed)
PCP: [name]  Date Today: 02/02/18     Chief complaint: BNC    HPI:   Stephen [name] is a 67 year old male here for follow-up of [place] [facility] & [place]:    1.  Complex cystoscopy.   2.  Incision of bladder neck with injection of Mitomycin-C into the area.   3.  Suprapubic tube change.    Current symptoms: no UTIs, low back pain, Has not urinated through his urethra yet    Medications:  Current Outpatient Medications on File Prior to Visit   Medication Sig Dispense Refill    allopurinol (ZYLOPRIM) 300 MG tablet Take 300 mg by mouth daily.      amLODIPINE (NORVASC) 5 MG tablet Take 10 mg by mouth daily.      atenolol (TENORMIN) 25 MG tablet Take 25 mg by mouth daily.      ibuprofen (MOTRIN) 200 MG tablet Take 200 mg by mouth every 6 hours as needed for Mild Pain (Pain Score 1-3).      Ibuprofen-Diphenhydramine Cit (ADVIL PM PO) as needed (1 qhs).      meloxicam (MOBIC) 7.5 MG tablet        tizanidine (ZANAFLEX) 4 MG capsule Take 4 mg by mouth 3 times daily.       No current facility-administered medications on file prior to visit.      OBJECTIVE:  Vital Signs:  BP 134/64 (BP Location: Left arm, BP Patient Position: Sitting, BP cuff size: Large)    Pulse 57    Temp 98.9 F (37.2 C) (Temporal)    Resp 18    Ht 5\' 7"  (1.702 m)    Wt 95.3 kg (210 lb)    BMI 32.89 kg/m     Physical:    GENERAL: Pleasant, cooperative and in no acute distress.   NEURO: Alert and Oriented x 3  HEENT: normalcephalic/atraumatic  NECK: supple, No LAD  RESP: non-labored breathing, no wheezing  BACK: No CVAT  GU: unchanged from last visit, SPT in place  EXT: warm, no edema     Cystoscopy:   The patient was prepped and draped in the usual sterile fashion. Lidocaine gel was injection into the urethra. A 42fr flexible cystoscope was introduce into the meatus.    Findings:  Meatus: normal  Anterior urethra: normal  Membranous urethra: normal  Prostatic Urethra: TURP defect 58fr opening into the bladder    Bladder: normal  mucosa, no masses or foreign bodies identified    Patient tolerated the procedure without difficulty. Findings were discussed with the patient.    ASSESSMENT:   67 year old male here for follow-up of BNI with [place].    PLAN:     - We will plan to repeat his cystoscopy in 3 months to re-evaluate the BN. He was able to void today. His SPT was clamped. If he is unable to void, he will unclamp his SPT.    He will lets [name] know how he is doing.    I personally reviewed the patient's history, and interviewed and examined the patient. I agree with the documentation completed by the fellow. I was present for the entire procedure. I directed the assessment and plan as stated above.       Sharyne Peach Lucia Bitter, MD FACS

## 2018-01-31 ENCOUNTER — Ambulatory Visit: Payer: Medicaid Other | Attending: Urology | Admitting: Urology

## 2018-01-31 ENCOUNTER — Encounter (HOSPITAL_BASED_OUTPATIENT_CLINIC_OR_DEPARTMENT_OTHER): Payer: Self-pay | Admitting: Urology

## 2018-01-31 VITALS — BP 134/64 | HR 57 | Temp 98.9°F | Resp 18 | Ht 67.0 in | Wt 210.0 lb

## 2018-01-31 DIAGNOSIS — N320 Bladder-neck obstruction: Secondary | ICD-10-CM

## 2018-01-31 DIAGNOSIS — R339 Retention of urine, unspecified: Principal | ICD-10-CM

## 2018-01-31 MED ORDER — LIDOCAINE HCL 2% EX GEL (UROJET)
10.00 mL | Freq: Once | Status: AC
Start: 2018-01-31 — End: 2018-02-01

## 2018-01-31 NOTE — Progress Notes (Deleted)
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   Since his last visit he has ***.    GU History   UDS shows good storage phase however he was not able to initiate a void. PVR 500 ccs,  - RUG showed no anterior urethra strictures  - VUDS report shows that bladder neck was closed with straining. I wonder if this is secondary to the [facility]  - VUDS also didn't show VUR. His mild hydro may be physiologic since there was no obstruction on CT done on 09/14/16 and he is asymptomatic.    No flowsheet data found.    Allergies   Allergen Reactions    Sulfa Drugs Unspecified       A comprehensive PFSH performed during a previous encounter in the office was re-examined and reviewed with the patient. There is nothing new to add today.   For details, please refer to my previous note in this chart, dated  *** .    Review of Systems       Objective:  Vitals: There were no vitals taken for this visit.  Neuro/psych:  Alert and oriented x 3 in a pleasant mood  Skin: Warm, dry and intact  {URO EXAM, SYSTEM SELECT:11690}    Diagnostic Studies Performed Today:  Uroflow:  Voided *** mLs with a Qmax of *** mLs/sec and a *** flow pattern.  PVR:  *** mLs obtained by  {CATHETERIZATION/BLADDER ZYYQ:825003}.    Lab Review:  BUN (mg/dL)   Date Value   02/28/2017 22     Creatinine (mg/dL)   Date Value   02/28/2017 1.16     No results found for: PSA  No results found for: UA    Assessment:  No diagnosis found.    Plan:  Based on my review of his symptoms, findings on physical exam and diagnostic studies performed the patient has ***.

## 2018-01-31 NOTE — Interdisciplinary (Signed)
Chief Complaint:   Chief Complaint   Patient presents with    Urethral Stricture       History:  67 year old patient who is here for cystoscopy.     Exam:    Vitals: BP 134/64 (BP Location: Left arm, BP Patient Position: Sitting, BP cuff size: Large)    Pulse 57    Temp 98.9 F (37.2 C) (Temporal)    Resp 18    Ht 5\' 7"  (1.702 m)    Wt 95.3 kg (210 lb)    BMI 32.89 kg/m     Procedure:   Patient was brought into the procedure room. A urine sample was not taken.Marland Kitchen Physician was notified if any signs/sympotoms of infection. A signed consent was obtained from the patient for the procedure.    No results found for: COLORUA, APPEARUA, GLUCOSEUA, BILIUA, KETONEUA, SGUA, BLOODUA, PHUA, PROTEINUA, UROBILUA, NITRITEUA, LEUKESTUA, WBCUA, RBCUA, EPITHCELLSUA, HYALINEUA, CRYSTALSUA, COMMENTSUA      2% lidocaine gel administered per urethra    Time out was called at 1210, making sure that the following elements were all correct:  Correct patient, procedure, side,  site and position.    Assessment/Plan:  See physician's note

## 2018-02-01 ENCOUNTER — Telehealth (HOSPITAL_BASED_OUTPATIENT_CLINIC_OR_DEPARTMENT_OTHER): Payer: Self-pay | Admitting: Urology

## 2018-02-01 ENCOUNTER — Ambulatory Visit
Admission: RE | Admit: 2018-02-01 | Discharge: 2018-02-01 | Disposition: A | Discharge status: Home / Self Care | Payer: Medicaid Other | Attending: Diagnostic Radiology | Admitting: Diagnostic Radiology

## 2018-02-01 ENCOUNTER — Encounter (HOSPITAL_BASED_OUTPATIENT_CLINIC_OR_DEPARTMENT_OTHER): Payer: Self-pay | Admitting: Urology

## 2018-02-01 ENCOUNTER — Ambulatory Visit (HOSPITAL_BASED_OUTPATIENT_CLINIC_OR_DEPARTMENT_OTHER): Payer: Medicaid Other | Admitting: Urology

## 2018-02-01 VITALS — BP 134/66 | HR 58 | Temp 99.0°F | Resp 18 | Ht 67.0 in | Wt 210.0 lb

## 2018-02-01 DIAGNOSIS — N320 Bladder-neck obstruction: Secondary | ICD-10-CM | POA: Insufficient documentation

## 2018-02-01 DIAGNOSIS — R339 Retention of urine, unspecified: Secondary | ICD-10-CM

## 2018-02-01 DIAGNOSIS — M5136 Other intervertebral disc degeneration, lumbar region: Principal | ICD-10-CM | POA: Insufficient documentation

## 2018-02-01 DIAGNOSIS — T85193D Other mechanical complication of implanted electronic neurostimulator, generator, subsequent encounter: Secondary | ICD-10-CM | POA: Insufficient documentation

## 2018-02-01 DIAGNOSIS — Z462 Encounter for fitting and adjustment of other devices related to nervous system and special senses: Secondary | ICD-10-CM | POA: Insufficient documentation

## 2018-02-01 DIAGNOSIS — M47897 Other spondylosis, lumbosacral region: Secondary | ICD-10-CM | POA: Insufficient documentation

## 2018-02-01 NOTE — Progress Notes (Signed)
UROLOGY CLINIC NOTE    CC: would like interstim removed.     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     Pt had interstim placed at outside urologist in 2013.  He states it never worked and that it has been turned off or non-functional for years.  He wants it removed because he has been having lower back pain and requires an MRI of his lumbar spine.      GU History  2007: Lithotripsy after which he developed clot retention  2010: TURP   Developed [place] was told he needed to catheterize  Recurrent [place]  2013 - Interstim placed; doesn't recall if he was able to urinate after, thinks he was still catheterizing.    Recurrent [place]  2016 - SPT placed   12/07/2017: DVIU with [facility] by Dr. Lucia Bitter     Cystoscopy 01/31/2018  Findings:  Meatus: normal  Anterior urethra: normal  Membranous urethra: normal  Prostatic Urethra: TURP defect 71fr opening into the bladder    Bladder: normal mucosa, no masses or foreign bodies identified    VUDS 04/20/2017  Impression:  Normal storage phase  Unable to assess voiding phase, no voluntary detrusor contraction noted.   No evidence of anterior urethral stricture on retrograde imaging    Of note, patient states his Interstim device has been off for many years but this was not confirmed before the study.      Attempted to interrogate interstim device today - unable to communicate with the device, likely due to dead battery.     PMH:  Past Medical History:   Diagnosis Date    Chronic suprapubic catheter (CMS-HCC)     Congenital hydronephrosis     Gout     Headache     Hematuria     Kidney disease     Kidney stones     Major depressive disorder, single episode      Polyarthropathy or polyarthritis of multiple sites     Urethral stricture      Patient Active Problem List   Diagnosis    Post-traumatic stricture of anterior urethra    Bladder neck contracture    Lumbar radiculopathy    Cervical radiculopathy    Incomplete bladder emptying     PSH:  Past Surgical History:   Procedure Laterality Date    APPENDECTOMY      CT INSERTION OF SUPRAPUBIC CATH  09/25/2015    CYSTOSCOPY      CYSTOSCOPY W/ LASER LITHOTRIPSY      NEPHRECTOMY Right 1995    OTHER SURGICAL HISTORY      Interstim 01/29/2011     Allergies   Allergen Reactions    Sulfa Drugs Unspecified     Social History     Socioeconomic History    Marital status: Single     Spouse name: Not on file    Number of children: Not on file    Years of education: Not on file    Highest education level: Not on file   Social Needs    Financial resource strain: Not on file    Food insecurity - worry: Not on file    Food insecurity - inability:  Not on file    Transportation needs - medical: Not on file    Transportation needs - non-medical: Not on file   Occupational History    Not on file   Tobacco Use    Smoking status: Never Smoker    Smokeless tobacco: Never Used   Substance and Sexual Activity    Alcohol use: Not on file    Drug use: Not on file    Sexual activity: No   Other Topics Concern    Not on file   Social History Narrative    Not on file     Family History   Adopted: Yes   Family history unknown: Yes       Current Outpatient Medications   Medication Sig    allopurinol (ZYLOPRIM) 300 MG tablet Take 300 mg by mouth daily.    amLODIPINE (NORVASC) 5 MG tablet Take 10 mg by mouth daily.    atenolol (TENORMIN) 25 MG tablet Take 25 mg by mouth daily.    ibuprofen (MOTRIN) 200 MG tablet Take 200 mg by mouth every 6 hours as needed for Mild Pain (Pain Score 1-3).    Ibuprofen-Diphenhydramine Cit (ADVIL PM PO) as needed (1 qhs).    meloxicam (MOBIC) 7.5 MG tablet      tizanidine (ZANAFLEX) 4 MG  capsule Take 4 mg by mouth 3 times daily.     No current facility-administered medications for this visit.         Review of Systems:   Review of Systems  Constitutional: Negative  Eyes: Glasses/Contacts  ENT: Negative  Cardiac: Negative  Pulmonary: Negative  Gastrointestional: Negative  Musculoskeletal: Pain in joints;Lower back pain;Muscle weakness  Skin: Negative  Neurologic: Negative  Psychiatric: Difficulty Sleeping  Endocrine: Dry skin;Sensitive to heat/cold  Blood Disease: Negative  Allergy: Negative  OB/Gyn: Negative    Physical Exam:   Vitals:    02/01/18 0938   BP: 134/66   BP Location: Left arm   BP Patient Position: Sitting   BP cuff size: Regular   Pulse: 58   Resp: 18   Temp: 99 F (37.2 C)   TempSrc: Temporal   SpO2: 98%   Weight: 95.3 kg (210 lb)   Height: 5\' 7"  (1.702 m)     GENERAL: The patient is an alert, overweight, cooperative male in no acute distress.   HEAD/NECK: normalcephalic/atraumatic; midline trachea.   PULM: unlabored breathing on room air without coughing or wheezing  ABD: soft, NT, ND; SPT in place, capped  BACK: no CVAT or midline tenderness  EXTREMITIES: No redness or swelling.   SKIN: There is no rash or cyanosis. Well healed scar over left upper buttocks with palpable battery beneath  NEURO: Alert and Oriented x 3, normal gait, ambulates without assistive device.      Results:   Lab Results   Component Value Date    NA 140 02/28/2017    K 4.4 02/28/2017    CL 100 02/28/2017    BICARB 25 02/28/2017    BUN 22 02/28/2017    CREAT 1.16 02/28/2017    GLU 108 (H) 02/28/2017    [place] 9.3 02/28/2017       PVR: 70mL    All pertinent lab results and imaging have been reviewed with this patient.      Assessment and Plan:    ICD-10-CM ICD-9-CM   1. Incomplete bladder emptying R33.9 788.21   2. Bladder neck contracture N32.0 596.0   3. Other mechanical complication of implanted  electronic neurostimulator, generator, subsequent encounter T85.193D [phone]     67 y/o M with h/o TURP, recurrent BNC  now s/p DVIU with [facility] on 12/07/2017.  Patient previously managed bladder with CIC and SPT, spontaneously voiding as of yesterday.  He had interstim placed in 2013 for incomplete emptying but this has been turned off for some time and is currently non-functional.  Due to patient need for MRI and non-function of device, we discussed option to remove device today.  We also discussed possibility of battery replacement but patient would like the device removed entirely.  We discussed the procedure in detail including risks such as infection, bleeding, damage to surrounding tissues.  He understands and wishes to proceed.     Plan:  Lumbosacral spine X-ray  OR for Interstim removal - consent signed      Etter Sjogren. Purvis Kilts    [facility]  Urology [facility]  [address]  [name], [place] [address]

## 2018-02-01 NOTE — Progress Notes (Signed)
Subjective:  I reviewed the history and spoke with the patient .    History of present illness (HPI): Briefly patient is presents for assessment of SNS   Review of Systems (ROS): As per  the PA note.  Past Medical, Family, Social History:  As per the PA note.    Objective:   BP 134/66 (BP Location: Left arm, BP Patient Position: Sitting, BP cuff size: Regular)    Pulse 58    Temp 99 F (37.2 C) (Temporal)    Resp 18    Ht 5\' 7"  (1.702 m)    Wt 95.3 kg (210 lb)    SpO2 98%    BMI 32.89 kg/m   I have examined the patient and I concur with the PA exam.    Assessment:    ICD-10-CM ICD-9-CM   1. Incomplete bladder emptying R33.9 788.21   2. Bladder neck contracture N32.0 596.0   3. Other mechanical complication of implanted electronic neurostimulator, generator, subsequent encounter T85.193D 996.2       Plan:  Reviewed with the PA physician.  I agree with the PA plan as documented.    Discussed bleeding, infection, failure to remove entire device, nerve damage.  Patient signed the informed consent.     See the PA note for further details.

## 2018-02-01 NOTE — Telephone Encounter (Signed)
Pt calling to provide MD's name of previous Urologist that started Morral A Anabel Bene in [place]  [facility]: [place] : [phone]

## 2018-02-01 NOTE — Patient Instructions (Signed)
-  Stephen [name]. Stephen Goodnight, MD  Co-Director, [place], Department of Urology   Professor of Urology    Stephen Herald, MD  Assistant Professor of Urology    Stephen [name], [place]    Nurse  Clinic Number [phone]  [id number]  Stephen [name], Stephen [name] [address]     Appointment Line #: [phone]     Stephen [name]  Office number # [phone]  Fax number # [phone]     Clinic Hours: 8-5pm     AFTER HOURS EMERGENCY NUMBER # [phone]  (Ask for the Urologist On-Call)

## 2018-02-06 ENCOUNTER — Telehealth: Payer: Self-pay | Admitting: *Deleted

## 2018-02-06 NOTE — Telephone Encounter (Signed)
Left message for patient to notify them that it is time to schedule annual low dose lung cancer screening CT scan. Instructed patient to call back to verify information prior to the scan being scheduled.  

## 2018-02-08 ENCOUNTER — Ambulatory Visit: Payer: Medicaid Other | Attending: Urology

## 2018-02-08 ENCOUNTER — Telehealth (HOSPITAL_BASED_OUTPATIENT_CLINIC_OR_DEPARTMENT_OTHER): Payer: Self-pay | Admitting: Urology

## 2018-02-08 DIAGNOSIS — R339 Retention of urine, unspecified: Principal | ICD-10-CM | POA: Insufficient documentation

## 2018-02-08 NOTE — Telephone Encounter (Signed)
Case Request Order Details   Procedure: INSERTION, NEUROSTIMULATOR, SACRAL REMOVAL  OR for Interstim removal - consent signed    Spoke with patient, he is aware I am working on coordinating a date and time for his surgical procedure and will let him know once I can confirm a date for his surgery. Pt understood and will wait on call back.

## 2018-02-08 NOTE — Interdisciplinary (Signed)
Catheter Change:    Patient presents to clinic for routine catheter change, per standing order from MD. Patientambulatory to clinic and in no acute distress. No new sx or concerns reported.    Chaperone offered, patient declined     Preparation:  Allergies verified with patient  Procedure explained  Patient verbalized understanding w/o objections  Patient positioned and prepped for procedure    Procedure:  Patient states he is doing "bladder training" so no bag is needed.   16 Fr indwelling catheter discontinued with no difficulty after fully deflating cath balloon - 10 mls of water aspirated from cath balloon. Patient tolerated well with minimal discomfort. Under sterile technique patient prepped with betadine. 16 Fr indwelling catheter inserted into bladder with no difficulty. 10 mls of sterile water instilled within cath balloon. Bladder irrigated with  47ml sterile water. Clear, yellow urine drained.   Patient tolerated procedure well with no discomfort. Patient stable at discharge.

## 2018-02-14 ENCOUNTER — Telehealth: Payer: Self-pay | Admitting: *Deleted

## 2018-02-14 NOTE — Telephone Encounter (Signed)
Left message for patient to notify them that it is time to schedule annual low dose lung cancer screening CT scan. Instructed patient to call back to verify information prior to the scan being scheduled.  

## 2018-02-16 ENCOUNTER — Telehealth: Payer: Self-pay | Admitting: *Deleted

## 2018-02-16 DIAGNOSIS — Z87891 Personal history of nicotine dependence: Secondary | ICD-10-CM

## 2018-02-16 DIAGNOSIS — Z122 Encounter for screening for malignant neoplasm of respiratory organs: Secondary | ICD-10-CM

## 2018-02-16 NOTE — Telephone Encounter (Signed)
Notified patient that annual lung cancer screening low dose CT scan is due currently or will be in near future. Confirmed that patient is within the age range of 55-77, and asymptomatic, (no signs or symptoms of lung cancer). Patient denies illness that would prevent curative treatment for lung cancer if found. Verified smoking history, (former, quit 8/12, 42 pack year). The shared decision making visit was done 02/07/17. Patient is agreeable for CT scan being scheduled.

## 2018-02-18 ENCOUNTER — Other Ambulatory Visit: Payer: Self-pay | Admitting: Family Medicine

## 2018-02-18 DIAGNOSIS — E7800 Pure hypercholesterolemia, unspecified: Secondary | ICD-10-CM

## 2018-02-18 DIAGNOSIS — R739 Hyperglycemia, unspecified: Secondary | ICD-10-CM

## 2018-02-18 DIAGNOSIS — Z125 Encounter for screening for malignant neoplasm of prostate: Secondary | ICD-10-CM

## 2018-02-21 ENCOUNTER — Ambulatory Visit: Admission: RE | Admit: 2018-02-21 | Payer: BLUE CROSS/BLUE SHIELD | Source: Ambulatory Visit

## 2018-02-21 ENCOUNTER — Ambulatory Visit
Admission: RE | Admit: 2018-02-21 | Discharge: 2018-02-21 | Disposition: A | Discharge status: Home / Self Care | Payer: BLUE CROSS/BLUE SHIELD | Source: Ambulatory Visit | Attending: Oncology | Admitting: Oncology

## 2018-02-21 ENCOUNTER — Other Ambulatory Visit (INDEPENDENT_AMBULATORY_CARE_PROVIDER_SITE_OTHER): Payer: BLUE CROSS/BLUE SHIELD

## 2018-02-21 DIAGNOSIS — E7800 Pure hypercholesterolemia, unspecified: Secondary | ICD-10-CM | POA: Diagnosis not present

## 2018-02-21 DIAGNOSIS — R739 Hyperglycemia, unspecified: Secondary | ICD-10-CM

## 2018-02-21 DIAGNOSIS — I700 Atherosclerosis of aorta: Secondary | ICD-10-CM | POA: Diagnosis not present

## 2018-02-21 DIAGNOSIS — J439 Emphysema, unspecified: Secondary | ICD-10-CM | POA: Insufficient documentation

## 2018-02-21 DIAGNOSIS — Z122 Encounter for screening for malignant neoplasm of respiratory organs: Secondary | ICD-10-CM | POA: Diagnosis present

## 2018-02-21 DIAGNOSIS — Z125 Encounter for screening for malignant neoplasm of prostate: Secondary | ICD-10-CM

## 2018-02-21 DIAGNOSIS — Z87891 Personal history of nicotine dependence: Secondary | ICD-10-CM

## 2018-02-21 LAB — COMPREHENSIVE METABOLIC PANEL
ALT: 18 U/L (ref 0–53)
AST: 14 U/L (ref 0–37)
Albumin: 4 g/dL (ref 3.5–5.2)
Alkaline Phosphatase: 60 U/L (ref 39–117)
BUN: 16 mg/dL (ref 6–23)
CO2: 31 mEq/L (ref 19–32)
Calcium: 9.7 mg/dL (ref 8.4–10.5)
Chloride: 101 mEq/L (ref 96–112)
Creatinine, Ser: 1.07 mg/dL (ref 0.40–1.50)
GFR: 73.89 mL/min (ref >60.00)
Glucose, Bld: 96 mg/dL (ref 70–99)
Potassium: 4.3 mEq/L (ref 3.5–5.1)
Sodium: 137 mEq/L (ref 135–145)
Total Bilirubin: 0.6 mg/dL (ref 0.2–1.2)
Total Protein: 7 g/dL (ref 6.0–8.3)

## 2018-02-21 LAB — LIPID PANEL
Cholesterol: 166 mg/dL (ref 0–200)
HDL: 56.1 mg/dL (ref >39.00)
LDL Cholesterol: 80 mg/dL (ref 0–99)
NonHDL: 109.77
Total CHOL/HDL Ratio: 3
Triglycerides: 149 mg/dL (ref 0.0–149.0)
VLDL: 29.8 mg/dL (ref 0.0–40.0)

## 2018-02-21 LAB — HEMOGLOBIN A1C: Hgb A1c MFr Bld: 6 % (ref 4.6–6.5)

## 2018-02-21 LAB — PSA: PSA: 2.73 ng/mL (ref 0.10–4.00)

## 2018-02-23 ENCOUNTER — Other Ambulatory Visit: Payer: BLUE CROSS/BLUE SHIELD

## 2018-02-23 NOTE — Telephone Encounter (Signed)
Patient calling to follow up with Collie Siad in regards to scheduling surgery interstim removal ordered by Dr. Neita Goodnight.      Patient aware Scherrie Merritts until this Monday 3/11 and will follow up with patient then.      Patient aware to expect call from Collie Siad early next week with status of surgery date.     Pt Phone# [phone]

## 2018-02-26 NOTE — Telephone Encounter (Signed)
Spoke with patient, had a cancellation for surgery for next week with Dr. Neita Goodnight. Also confirmed with [facility] [employer] with [employer] that he will be avalaible for the removal.     Surgery letter, ASA precaution list and pre-operative instructions have been emailed to patient: [email or web address]    Patient also aware NPO after midnight prior to surgery.    SURGERY PLAN:  Surgery Date: [facility] 3/19  Check in time: 10:00am  [facility]: Not available. Will informed Dr. Neita Goodnight he would need to complete H&P morning of surgery.  Planned Post-op Destination: Discharge to Home    Is Medical Clearance needed? No    Is urine culture completed?: No, since this is removal will ask provider if UCx is needed.    Sleep Apnea: No    Surgical Consent : has been signed and scanned into media.    Auth:  Will request as time gets closer per auth teams request.     I have informed the patient to please discontinue taking any aspirin/blood thinning medication and/or anti-inflammatory drugs at lease 7 days before their scheduled procedure.     ASPIRIN AND IBUPROFEN CAUTION SHEET  FOR SURGERY PATIENTS    For 7 days prior to surgery, do not take any medication containing aspirin or ibuprofen.  Please refer to the list below for some of the medications that contain aspirin and/or ibuprofen.  If it is necessary for you to take any medication, please use Tylenol or acetaminophen substitutes.    Advil    Gingkoba  Alka-Seltzer   Liquiprin  Alleve    Measurin  Anacin     Midol  APC     Motrin  ASA compound    Norgesic  Ascriptin    Novahistine with APC  Aspergum    Nuprin  Bufferin    PAC  CAMA    Percodan  Capron capsules   Phenaphen  Contact    Phensol  Cope     Plavix  Coricidin   Robaxisal  Counterpain    Sal-Fayne  Daprisal    Stanback  Darvon compound   Super Anahist  Dolene compound   Synalogos  Dristan     Talwin compound  Ecotrin     Trigesic  Edrisal     Triphen  Equagesic    Trilisate  Excedrin    Triaminic  Femcaps    Vanquish  Fiorinal    Vitamin  E (or any other multi-vitamin)  Gingkoba    Zactirin    This list does not include every medication that contains aspirin or ibuprofen.  Before taking any medication prior to or after surgery, please read the label carefully for the active ingredients aspirin, salicylates, and/or ibuprofen.  If the medication contains these ingredients do not use.  Please inform your physician of all medications you are taking, including non-prescription medications.    PRE-OPERATIVE NPO BOWEL PREPARATION    As soon as you wake up on the [place], please have a bowel movement if possible before you leave for the hospital.     Take your regular medications on the [place] (except Aspirin, Coumadin or Plavix).  If you regularly take any medication in the morning, especially insulin or other oral medication for diabetes you must discuss this with your doctor.  Please make sure that your doctor approves all the medications that you are taking.    DO NOT eat or drink anything after midnight on the night before your surgery.    REMEMBER:  You cannot take aspirin or other similar medications (Motrin, Ibuprofen, Naprosen) or blood thinners (coumadin, plavix) for 1 week prior to this procedure.      If you have any questions about this Bowel Preparation, please call the   Urology nurse line at # [phone].      PRE-OPERATIVE INSTRUCTIONS     If there is any change in your surgery time, you will be contacted by the operating room scheduling team after 5:00 p.m. the day before your surgery     1. For 7 days prior to surgery, please do not take any medications that contain aspirin, ibuprofen, or any non-steroidal anti-inflammatory medications.  If you must take pain medication, please take Tylenol or an acetaminophen substitute.  2. If you take prescription medications, check with the Anesthesiologist or your Physician/Cardiologist about whether you should take your medications on the day of surgery, especially blood  pressure and heart medications.    3. If you smoke, do not smoke for at least 24 hours prior to surgery.  Please be aware that [facility] is a non-smoking facility.  4. Wear comfortable, loose clothing to the hospital.  5. Leave all valuables at home.  This includes jewelry, credit cards, money (except for copayment for discharge medications).  6. ALL jewelry must be removed, including rings, earrings, necklaces, navel rings, etc.  7. If you wear contact lenses or glasses, bring a case for them.  Contact lenses must be removed prior to surgery.  8. Bring your insurance carrier cards and picture I.D. with you.  If your insurance plan is accepted at our [place] and you would like to have your discharge prescriptions filled there, you will need to bring money for your co-payment.  9. Please arrange for transportation home after your surgery and hospital stay. A taxi or shuttle bus is not acceptable! You will need to be accompanied home by a responsible adult.  You may want to have a pillow and/or blanket in the car for the ride home.  10. If you will be staying in the hospital, bring a robe, slippers and any items for grooming that you may need during your stay.   11. Please call your doctor if you have any questions regarding your surgery or if you develop any signs or symptoms of illness (fever, runny nose, cough, sore throat).    NOTE:   Your surgery may have to be cancelled if you:    1) You do not follow the fasting/bowel prep guidelines    2) You arrive late for check in at the hospital                3) You have not arranged for a responsible adult to accompany you home      From: Phonhthongsy, [place]   Sent: Monday, February 26, 2018 1:46 PM  To: '[email or web address]>  Subject: Surgery Confirmation with Dr. Neita Goodnight    Hello Mr. Olander,     Thank you for speaking with me earlier today regarding your upcoming surgery with Dr. Neita Goodnight. Please note your surgical letter, bowel prep  instructions and aspirin restriction form is attached. Please review it and call me with questions via Tel: [phone]. If you have any clinical questions, please feel free to contact our triage nurse at [phone] or [phone].     Please kindly reply to this email to acknowledge that you have received your surgery letter, bowel prep, pre-op instructions and Aspirin letters.  Thank you,  Ronnell Guadalajara Phonhthongsy  Clinical Coordinator   Department of Urology   [facility]  [address], [address]: [address]  [place], [place] [address]  Urology Admin Office # [phone]  Urology Admin Fax # [phone]  Patient Scheduling # [phone]  E   [email or web address]

## 2018-02-27 ENCOUNTER — Encounter: Payer: Self-pay | Admitting: Family Medicine

## 2018-02-27 ENCOUNTER — Ambulatory Visit (INDEPENDENT_AMBULATORY_CARE_PROVIDER_SITE_OTHER): Payer: BLUE CROSS/BLUE SHIELD | Admitting: Family Medicine

## 2018-02-27 ENCOUNTER — Other Ambulatory Visit (INDEPENDENT_AMBULATORY_CARE_PROVIDER_SITE_OTHER): Payer: Self-pay | Admitting: Urology

## 2018-02-27 VITALS — BP 124/62 | HR 60 | Temp 98.4°F | Ht 68.0 in | Wt 237.0 lb

## 2018-02-27 DIAGNOSIS — Z87891 Personal history of nicotine dependence: Secondary | ICD-10-CM

## 2018-02-27 DIAGNOSIS — J432 Centrilobular emphysema: Secondary | ICD-10-CM | POA: Diagnosis not present

## 2018-02-27 DIAGNOSIS — I2510 Atherosclerotic heart disease of native coronary artery without angina pectoris: Secondary | ICD-10-CM | POA: Diagnosis not present

## 2018-02-27 DIAGNOSIS — I700 Atherosclerosis of aorta: Secondary | ICD-10-CM

## 2018-02-27 DIAGNOSIS — M25551 Pain in right hip: Secondary | ICD-10-CM | POA: Diagnosis not present

## 2018-02-27 DIAGNOSIS — I351 Nonrheumatic aortic (valve) insufficiency: Secondary | ICD-10-CM

## 2018-02-27 DIAGNOSIS — R7303 Prediabetes: Secondary | ICD-10-CM | POA: Diagnosis not present

## 2018-02-27 DIAGNOSIS — Z1211 Encounter for screening for malignant neoplasm of colon: Secondary | ICD-10-CM

## 2018-02-27 DIAGNOSIS — E7800 Pure hypercholesterolemia, unspecified: Secondary | ICD-10-CM

## 2018-02-27 DIAGNOSIS — N529 Male erectile dysfunction, unspecified: Secondary | ICD-10-CM

## 2018-02-27 DIAGNOSIS — Z0000 Encounter for general adult medical examination without abnormal findings: Secondary | ICD-10-CM | POA: Diagnosis not present

## 2018-02-27 DIAGNOSIS — Z6835 Body mass index (BMI) 35.0-35.9, adult: Secondary | ICD-10-CM

## 2018-02-27 LAB — EMMI , PATIENT SATISFACTION: HOSPITAL DISCHARGE EXPECTATIONS: EMMI Video Order Number: 15772894059

## 2018-02-27 LAB — POC URINALSYSI DIPSTICK (AUTOMATED)
Bilirubin, UA: NEGATIVE
Blood, UA: NEGATIVE
Glucose, UA: NEGATIVE
Ketones, UA: NEGATIVE
Leukocytes, UA: NEGATIVE
Nitrite, UA: NEGATIVE
Protein, UA: NEGATIVE
Spec Grav, UA: >=1.03 — AB (ref 1.010–1.025)
Urobilinogen, UA: 0.2 E.U./dL
pH, UA: 6 (ref 5.0–8.0)

## 2018-02-27 MED ORDER — ASPIRIN 81 MG PO TABS: 81.0000 mg | ORAL_TABLET | Freq: Every day | ORAL | Status: AC

## 2018-02-27 MED ORDER — SILDENAFIL CITRATE 20 MG PO TABS: 80.0000 mg | ORAL_TABLET | Freq: Every day | ORAL | 3 refills | Status: DC | PRN

## 2018-02-27 MED ORDER — PRAVASTATIN SODIUM 80 MG PO TABS: 80.0000 mg | ORAL_TABLET | Freq: Every day | ORAL | 3 refills | Status: DC

## 2018-02-27 NOTE — Addendum Note (Signed)
Addended by: Brenton Grills on: 0/78/6754 49:20 AM   Modules accepted: Orders

## 2018-02-27 NOTE — Assessment & Plan Note (Signed)
Reviewed with patient, encouraged avoiding added sugars in diet.

## 2018-02-27 NOTE — Assessment & Plan Note (Signed)
Chronic, stable. Continue pravastatin 80mg  daily. The 10-year ASCVD risk score Mikey Bussing DC Brooke Bonito., et al., 2013) is: 9.1%   Values used to calculate the score:     Age: 67 years     Sex: Male     Is Non-Hispanic African American: No     Diabetic: No     Tobacco smoker: No     Systolic Blood Pressure: 449 mmHg     Is BP treated: No     HDL Cholesterol: 56.1 mg/dL     Total Cholesterol: 166 mg/dL

## 2018-02-27 NOTE — Patient Instructions (Addendum)
Price out generic sildenafil 50m 4-5 tablets at a time (sent locally) We will repeat heart ultrasound Start aspirin 825mdaily Pass by lab to pick up stool kit  Urinalysis today.  We will refer you to cardiology for a check on noted plaque in coronary arteries.   Health Maintenance, Male A healthy lifestyle and preventive care is important for your health and wellness. Ask your health care provider about what schedule of regular examinations is right for you. What should I know about weight and diet? Eat a Healthy Diet  Eat plenty of vegetables, fruits, whole grains, low-fat dairy products, and lean protein.  Do not eat a lot of foods high in solid fats, added sugars, or salt.  Maintain a Healthy Weight Regular exercise can help you achieve or maintain a healthy weight. You should:  Do at least 150 minutes of exercise each week. The exercise should increase your heart rate and make you sweat (moderate-intensity exercise).  Do strength-training exercises at least twice a week.  Watch Your Levels of Cholesterol and Blood Lipids  Have your blood tested for lipids and cholesterol every 5 years starting at 3575ears of age. If you are at high risk for heart disease, you should start having your blood tested when you are 20103ears old. You may need to have your cholesterol levels checked more often if: ? Your lipid or cholesterol levels are high. ? You are older than 5061ears of age. ? You are at high risk for heart disease.  What should I know about cancer screening? Many types of cancers can be detected early and may often be prevented. Lung Cancer  You should be screened every year for lung cancer if: ? You are a current smoker who has smoked for at least 30 years. ? You are a former smoker who has quit within the past 15 years.  Talk to your health care provider about your screening options, when you should start screening, and how often you should be screened.  Colorectal  Cancer  Routine colorectal cancer screening usually begins at 5087ears of age and should be repeated every 5-10 years until you are 7557ears old. You may need to be screened more often if early forms of precancerous polyps or small growths are found. Your health care provider may recommend screening at an earlier age if you have risk factors for colon cancer.  Your health care provider may recommend using home test kits to check for hidden blood in the stool.  A small camera at the end of a tube can be used to examine your colon (sigmoidoscopy or colonoscopy). This checks for the earliest forms of colorectal cancer.  Prostate and Testicular Cancer  Depending on your age and overall health, your health care provider may do certain tests to screen for prostate and testicular cancer.  Talk to your health care provider about any symptoms or concerns you have about testicular or prostate cancer.  Skin Cancer  Check your skin from head to toe regularly.  Tell your health care provider about any new moles or changes in moles, especially if: ? There is a change in a mole's size, shape, or color. ? You have a mole that is larger than a pencil eraser.  Always use sunscreen. Apply sunscreen liberally and repeat throughout the day.  Protect yourself by wearing long sleeves, pants, a wide-brimmed hat, and sunglasses when outside.  What should I know about heart disease, diabetes, and high blood pressure?  If you are 67-58 years of age, have your blood pressure checked every 3-5 years. If you are 21 years of age or older, have your blood pressure checked every year. You should have your blood pressure measured twice-once when you are at a hospital or clinic, and once when you are not at a hospital or clinic. Record the average of the two measurements. To check your blood pressure when you are not at a hospital or clinic, you can use: ? An automated blood pressure machine at a pharmacy. ? A home blood  pressure monitor.  Talk to your health care provider about your target blood pressure.  If you are between 13-53 years old, ask your health care provider if you should take aspirin to prevent heart disease.  Have regular diabetes screenings by checking your fasting blood sugar level. ? If you are at a normal weight and have a low risk for diabetes, have this test once every three years after the age of 53. ? If you are overweight and have a high risk for diabetes, consider being tested at a younger age or more often.  A one-time screening for abdominal aortic aneurysm (AAA) by ultrasound is recommended for men aged 36-75 years who are current or former smokers. What should I know about preventing infection? Hepatitis B If you have a higher risk for hepatitis B, you should be screened for this virus. Talk with your health care provider to find out if you are at risk for hepatitis B infection. Hepatitis C Blood testing is recommended for:  Everyone born from [birth year] through 1963.  Anyone with known risk factors for hepatitis C.  Sexually Transmitted Diseases (STDs)  You should be screened each year for STDs including gonorrhea and chlamydia if: ? You are sexually active and are younger than 65 years of age. ? You are older than 65 years of age and your health care provider tells you that you are at risk for this type of infection. ? Your sexual activity has changed since you were last screened and you are at an increased risk for chlamydia or gonorrhea. Ask your health care provider if you are at risk.  Talk with your health care provider about whether you are at high risk of being infected with HIV. Your health care provider may recommend a prescription medicine to help prevent HIV infection.  What else can I do?  Schedule regular health, dental, and eye exams.  Stay current with your vaccines (immunizations).  Do not use any tobacco products, such as cigarettes, chewing tobacco, and  e-cigarettes. If you need help quitting, ask your health care provider.  Limit alcohol intake to no more than 2 drinks per day. One drink equals 12 ounces of beer, 5 ounces of wine, or 1 ounces of hard liquor.  Do not use street drugs.  Do not share needles.  Ask your health care provider for help if you need support or information about quitting drugs.  Tell your health care provider if you often feel depressed.  Tell your health care provider if you have ever been abused or do not feel safe at home. This information is not intended to replace advice given to you by your health care provider. Make sure you discuss any questions you have with your health care provider. Document Released: 06/02/2008 Document Revised: 08/03/2016 Document Reviewed: 09/08/2015 Elsevier Interactive Patient Education  [employer].

## 2018-02-27 NOTE — Assessment & Plan Note (Signed)
Encouraged healthy diet and lifestyle changes to affect sustainable weight loss.  

## 2018-02-27 NOTE — Assessment & Plan Note (Signed)
Preventative protocols reviewed and updated unless pt declined. Discussed healthy diet and lifestyle.  

## 2018-02-27 NOTE — Assessment & Plan Note (Signed)
Update echo.

## 2018-02-27 NOTE — Assessment & Plan Note (Signed)
Undergoing lung cancer screening CT. Quit 2011

## 2018-02-27 NOTE — Assessment & Plan Note (Signed)
This has improved with changing wallet position

## 2018-02-27 NOTE — Assessment & Plan Note (Signed)
4v by CTA 2018. fmhx CAD. Continue statin. Start aspirin. Will refer to cards for further evaluation given endorsing chronic fatigue and dyspnea with mild exertion.

## 2018-02-27 NOTE — Progress Notes (Signed)
BP 124/62 (BP Location: Left Arm, Patient Position: Sitting, Cuff Size: Normal)   Pulse 60   Temp 98.4 F (36.9 C) (Oral)   Ht 5\' 8"  (1.727 m)   Wt 237 lb (107.5 kg)   SpO2 96%   BMI 36.04 kg/m    CC: CPE Subjective:    Patient ID: Johnny Holland, male    DOB: 1951-11-15, 67 y.o.   MRN: [id number]  HPI: Johnny Holland is a 67 y.o. male presenting on 02/27/2018 for Annual Exam (Requests sildenafil rx sent to [facility]. Wants to discuss lung screening.)   R hip pain - this has resolved.   Known AR - Echocardiogram (02/2017) showing calcified aortic valves, mild AS, mod AR, preserved EF 55-65%, mild LV dilation and LVH. Due for repeat echo. 4v CAD by CT scan (LAD and L circ) - strong fmhx CAD (father MI age 57, alcohol use). He had stress test years ago - unable to complete treadmill stress test at that time. Endorses increasing dyspnea and fatigue with exertion, becoming more noticeable. Denies chest pain with this.   Preventative: COLONOSCOPY 11/30/06 diverticulosis; no polyps. iFOB since then - requests repeat  Prostate cancer screening - yearly  Lung cancer screening - undergoing  Fluvax - declines Td - 2007, Tdap 2018 Zostavax - 2014 [facility]) Seat belt use discussed Sunscreen use discussed.No suspicious moles.Sees derm regularly.  Ex smoker - quit 2011 prior 1-2 ppd (42+ PY history). Continues vaping.  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 Interim medical history since our last visit reviewed. Allergies and medications reviewed and updated. Outpatient Medications Prior to Visit    Medication Sig Dispense Refill  . scopolamine (TRANSDERM-SCOP, 1.5 MG,) 1 MG/3DAYS Place 1 patch (1.5 mg total) onto the skin every 3 (three) days. 4 patch 1  . sildenafil (VIAGRA) 100 MG tablet TAKE 0.5-1 TABLETS (50-100 MG TOTAL) BY MOUTH DAILY AS NEEDED FOR ERECTILE DYSFUNCTION. 5 tablet 3  . pravastatin (PRAVACHOL) 80 MG tablet TAKE 1 TABLET AT BEDTIME 90 tablet 0   No facility-administered medications prior to visit.      Per HPI unless specifically indicated in ROS section below Review of Systems  Constitutional: Negative for activity change, appetite change, chills, fatigue, fever and unexpected weight change.  HENT: Negative for hearing loss.   Eyes: Negative for visual disturbance.  Respiratory: Positive for shortness of breath (with exertion). Negative for cough, chest tightness and wheezing.   Cardiovascular: Negative for chest pain, palpitations and leg swelling.  Gastrointestinal: Positive for abdominal pain (with overeating). Negative for abdominal distention, blood in stool, constipation, diarrhea, nausea and vomiting.  Genitourinary: Negative for difficulty urinating and hematuria.       "strong" dark urine   Musculoskeletal: Negative for arthralgias, myalgias and neck pain.  Skin: Negative for rash.  Neurological: Negative for dizziness, seizures, syncope and headaches.  Hematological: Negative for adenopathy. Does not bruise/bleed easily.  Psychiatric/Behavioral: Negative for dysphoric mood. The patient is not nervous/anxious.        Objective:    BP 124/62 (BP Location: Left Arm, Patient Position: Sitting, Cuff Size: Normal)   Pulse 60   Temp 98.4 F (36.9  C) (Oral)   Ht 5\' 8"  (1.727 m)   Wt 237 lb (107.5 kg)   SpO2 96%   BMI 36.04 kg/m   Wt Readings from Last 3 Encounters:  02/27/18 237 lb (107.5 kg)  02/21/18 230 lb (104.3 kg)  11/20/17 238 lb (108 kg)    Physical Exam  Constitutional: He is oriented to person, place, and time. He appears  well-developed and well-nourished. No distress.  HENT:  Head: Normocephalic and atraumatic.  Right Ear: Hearing, tympanic membrane, external ear and ear canal normal.  Left Ear: Hearing, tympanic membrane, external ear and ear canal normal.  Nose: Nose normal.  Mouth/Throat: Uvula is midline, oropharynx is clear and moist and mucous membranes are normal. No oropharyngeal exudate, posterior oropharyngeal edema or posterior oropharyngeal erythema.  Eyes: Conjunctivae and EOM are normal. Pupils are equal, round, and reactive to light. No scleral icterus.  Neck: Normal range of motion. Neck supple.  Cardiovascular: Normal rate, regular rhythm and intact distal pulses.  Murmur (3/6 systolic) heard. Pulses:      Radial pulses are 2+ on the right side, and 2+ on the left side.  Pulmonary/Chest: Effort normal and breath sounds normal. No respiratory distress. He has no wheezes. He has no rales.  Abdominal: Soft. Bowel sounds are normal. He exhibits no distension and no mass. There is no tenderness. There is no rebound and no guarding.  Genitourinary: Prostate normal. Rectal exam shows external hemorrhoid (non inflamed). Rectal exam shows no internal hemorrhoid, no fissure, no mass, no tenderness and anal tone normal. Prostate is not enlarged (15gm) and not tender.  Musculoskeletal: Normal range of motion. He exhibits no edema.  Lymphadenopathy:    He has no cervical adenopathy.  Neurological: He is alert and oriented to person, place, and time.  CN grossly intact, station and gait intact  Skin: Skin is warm and dry. No rash noted.  Psychiatric: He has a normal mood and affect. His behavior is normal. Judgment and thought content normal.  Nursing note and vitals reviewed.  Results for orders placed or performed in visit on 02/21/18  PSA  Result Value Ref Range   PSA 2.73 0.10 - 4.00 ng/mL  Hemoglobin A1c  Result Value Ref Range   Hgb A1c MFr Bld 6.0 4.6 - 6.5 %  Comprehensive metabolic panel    Result Value Ref Range   Sodium 137 135 - 145 mEq/L   Potassium 4.3 3.5 - 5.1 mEq/L   Chloride 101 96 - 112 mEq/L   CO2 31 19 - 32 mEq/L   Glucose, Bld 96 70 - 99 mg/dL   BUN 16 6 - 23 mg/dL   Creatinine, Ser 1.07 0.40 - 1.50 mg/dL   Total Bilirubin 0.6 0.2 - 1.2 mg/dL   Alkaline Phosphatase 60 39 - 117 U/L   AST 14 0 - 37 U/L   ALT 18 0 - 53 U/L   Total Protein 7.0 6.0 - 8.3 g/dL   Albumin 4.0 3.5 - 5.2 g/dL   Calcium 9.7 8.4 - 10.5 mg/dL   GFR 73.89 >60.00 mL/min  Lipid panel  Result Value Ref Range   Cholesterol 166 0 - 200 mg/dL   Triglycerides 149.0 0.0 - 149.0 mg/dL   HDL 56.10 >39.00 mg/dL   VLDL 29.8 0.0 - 40.0 mg/dL   LDL Cholesterol 80 0 - 99 mg/dL   Total CHOL/HDL Ratio 3    NonHDL 109.77       Assessment & Plan:   Problem List Items Addressed  This Visit    Aortic regurgitation    Update echo.       Relevant Medications   pravastatin (PRAVACHOL) 80 MG tablet   sildenafil (REVATIO) 20 MG tablet   aspirin 81 MG tablet   Other Relevant Orders   Ambulatory referral to Cardiology   ECHOCARDIOGRAM COMPLETE   CAD (coronary artery disease)    4v by CTA 2018. fmhx CAD. Continue statin. Start aspirin. Will refer to cards for further evaluation given endorsing chronic fatigue and dyspnea with mild exertion.       Relevant Medications   pravastatin (PRAVACHOL) 80 MG tablet   sildenafil (REVATIO) 20 MG tablet   aspirin 81 MG tablet   Other Relevant Orders   Ambulatory referral to Cardiology   COPD (chronic obstructive pulmonary disease) (HCC)    Mild by CT scan. eval heart, if stable consider spirometry to further eval dyspnea.       Erectile dysfunction    Discussed options - will price out generic sildenafil 20mg  dose - aware it will not be covered by insurance. If affordable, we can send in 90 d supply to [facility].       Healthcare maintenance - Primary    Preventative protocols reviewed and updated unless pt declined. Discussed healthy diet and  lifestyle.       HLD (hyperlipidemia)    Chronic, stable. Continue pravastatin 80mg  daily. The 10-year ASCVD risk score Mikey Bussing DC Brooke Bonito., et al., 2013) is: 9.1%   Values used to calculate the score:     Age: 27 years     Sex: Male     Is Non-Hispanic African American: No     Diabetic: No     Tobacco smoker: No     Systolic Blood Pressure: 299 mmHg     Is BP treated: No     HDL Cholesterol: 56.1 mg/dL     Total Cholesterol: 166 mg/dL       Relevant Medications   pravastatin (PRAVACHOL) 80 MG tablet   sildenafil (REVATIO) 20 MG tablet   aspirin 81 MG tablet   Personal history of tobacco use, presenting hazards to health    Undergoing lung cancer screening CT. Quit 2011      Prediabetes    Reviewed with patient, encouraged avoiding added sugars in diet.       Right hip pain    This has improved with changing wallet position      Severe obesity (BMI 35.0-35.9 with comorbidity) ([place])    Encouraged healthy diet and lifestyle changes to affect sustainable weight loss.       Thoracic aorta atherosclerosis (HCC)    Continue statin, start aspirin daily.      Relevant Medications   pravastatin (PRAVACHOL) 80 MG tablet   sildenafil (REVATIO) 20 MG tablet   aspirin 81 MG tablet    Other Visit Diagnoses    Special screening for malignant neoplasms, colon       Relevant Orders   Fecal occult blood, imunochemical       Meds ordered this encounter  Medications  . pravastatin (PRAVACHOL) 80 MG tablet    Sig: Take 1 tablet (80 mg total) by mouth at bedtime.    Dispense:  90 tablet    Refill:  3  . sildenafil (REVATIO) 20 MG tablet    Sig: Take 4-5 tablets (80-100 mg total) by mouth daily as needed (relations).    Dispense:  30 tablet    Refill:  3  . aspirin 81  MG tablet    Sig: Take 1 tablet (81 mg total) by mouth daily.   Orders Placed This Encounter  Procedures  . Fecal occult blood, imunochemical    Standing Status:   Future    Standing Expiration Date:   02/28/2019   . Ambulatory referral to Cardiology    Referral Priority:   Routine    Referral Type:   Consultation    Referral Reason:   Specialty Services Required    Requested Specialty:   Cardiology    Number of Visits Requested:   1  . ECHOCARDIOGRAM COMPLETE    Standing Status:   Future    Standing Expiration Date:   05/31/2019    Order Specific Question:   Where should this test be performed    Answer:   [facility]    Order Specific Question:   Perflutren DEFINITY (image enhancing agent) should be administered unless hypersensitivity or allergy exist    Answer:   Administer Perflutren    Order Specific Question:   Expected Date:    Answer:   1 month    Follow up plan: Return in about 1 year (around 02/28/2019) for annual exam, prior fasting for blood work.  Ria Bush, MD

## 2018-02-27 NOTE — Assessment & Plan Note (Signed)
Mild by CT scan. eval heart, if stable consider spirometry to further eval dyspnea.

## 2018-02-27 NOTE — Assessment & Plan Note (Signed)
Discussed options - will price out generic sildenafil 20mg  dose - aware it will not be covered by insurance. If affordable, we can send in 90 d supply to [facility].

## 2018-02-27 NOTE — Assessment & Plan Note (Signed)
Continue statin, start aspirin daily.

## 2018-02-27 NOTE — Telephone Encounter (Signed)
Received voicemail from patient requesting to discuss medication prior to surgery.    Patient also wants to know if his cath change can be done during surgery or if he should come to the clinic the day after since he is scheduled for a nurse visit 3/20 in [facility]

## 2018-02-27 NOTE — Telephone Encounter (Signed)
Called and spoke to patient using 2 identifiers. Patient stated that he was advised to take atenolol so he would like to take in the morning of his surgery, this RN advised he can with a little sip of water. Patient is also requesting to have his catheter changed on the day of procedure, this RN advised for patient to mention it to the staff upon checking so order can be placed and RN can do it and to still keep his appointment for nurse visit in case its not possible. Patient agreed and verbalized understanding.

## 2018-02-28 ENCOUNTER — Encounter: Payer: Self-pay | Admitting: *Deleted

## 2018-03-02 ENCOUNTER — Telehealth: Payer: Self-pay

## 2018-03-02 NOTE — Telephone Encounter (Signed)
Started PA for sildenafil citrate 20 mg tab, key:  [id number], PA case ID:  [id number], Rx #:  [id number]. Decision pending

## 2018-03-02 NOTE — Telephone Encounter (Signed)
Received voicemail from patient who states he is sick and was advise to cancel his surgical procedure for Tuesday 3/19    I called patient back and advise I will cancel his surgery and for him to give me a call back when he would like to reschedule this.    I have informed [facility] [employer] with [employer] that this case is cancelled for now.

## 2018-03-03 ENCOUNTER — Emergency Department (EMERGENCY_DEPARTMENT_HOSPITAL): Payer: Medicaid Other

## 2018-03-03 ENCOUNTER — Emergency Department
Admission: EM | Admit: 2018-03-03 | Discharge: 2018-03-03 | Disposition: A | Discharge status: Home / Self Care | Payer: Medicaid Other | Attending: Emergency Medicine | Admitting: Emergency Medicine

## 2018-03-03 DIAGNOSIS — F329 Major depressive disorder, single episode, unspecified: Secondary | ICD-10-CM | POA: Insufficient documentation

## 2018-03-03 DIAGNOSIS — Z882 Allergy status to sulfonamides status: Secondary | ICD-10-CM | POA: Insufficient documentation

## 2018-03-03 DIAGNOSIS — Z79899 Other long term (current) drug therapy: Secondary | ICD-10-CM | POA: Insufficient documentation

## 2018-03-03 DIAGNOSIS — Z87442 Personal history of urinary calculi: Secondary | ICD-10-CM | POA: Insufficient documentation

## 2018-03-03 DIAGNOSIS — N289 Disorder of kidney and ureter, unspecified: Secondary | ICD-10-CM | POA: Insufficient documentation

## 2018-03-03 DIAGNOSIS — Z905 Acquired absence of kidney: Secondary | ICD-10-CM | POA: Insufficient documentation

## 2018-03-03 DIAGNOSIS — M109 Gout, unspecified: Secondary | ICD-10-CM | POA: Insufficient documentation

## 2018-03-03 DIAGNOSIS — J111 Influenza due to unidentified influenza virus with other respiratory manifestations: Principal | ICD-10-CM | POA: Insufficient documentation

## 2018-03-03 DIAGNOSIS — R918 Other nonspecific abnormal finding of lung field: Secondary | ICD-10-CM

## 2018-03-03 LAB — ECG 12-LEAD
ATRIAL RATE: 63 {beats}/min
ECG INTERPRETATION: NORMAL
P AXIS: 47 degrees
PR INTERVAL: 186 ms
QRS INTERVAL/DURATION: 110 ms
QT: 408 ms
QTC INTERVAL: 417 ms
R AXIS: 6 degrees
T AXIS: 44 degrees
VENTRICULAR RATE: 63 {beats}/min

## 2018-03-03 LAB — MDIFF
Bands: 5 % (ref 0–15)
Number of Cells Counted: 114
Plt Est: ADEQUATE
RBC Comment: NORMAL
Reactive Lymphs: 3 %

## 2018-03-03 LAB — INFLUENZA A/B PANEL (CALM LAB)
Influenza A, Rapid: DETECTED — AB
Influenza B, Rapid: NOT DETECTED

## 2018-03-03 LAB — CBC WITH DIFF, BLOOD
ANC-Manual Mode: 5.1 10*3/uL (ref 1.6–7.0)
Abs Basophils: 0 10*3/uL (ref <0.1)
Abs Eosinophils: 0 10*3/uL (ref 0.1–0.5)
Abs Lymphs: 1.4 10*3/uL (ref 0.8–3.1)
Abs Monos: 0.8 10*3/uL (ref 0.2–0.8)
Basophils: 0 %
Eosinophils: 0 %
Hct: 38.5 % — ABNORMAL LOW (ref 40.0–50.0)
Hgb: 12.9 gm/dL — ABNORMAL LOW (ref 13.7–17.5)
Lymphocytes: 16 %
MCH: 31.1 pg (ref 26.0–32.0)
MCHC: 33.5 g/dL (ref 32.0–36.0)
MCV: 92.8 um3 (ref 79.0–95.0)
MPV: 10.2 fL (ref 9.4–12.4)
Monocytes: 11 %
Plt Count: 142 10*3/uL (ref 140–370)
RBC: 4.15 10*6/uL — ABNORMAL LOW (ref 4.60–6.10)
RDW: 13.8 % (ref 12.0–14.0)
Segs: 65 %
WBC: 7.3 10*3/uL (ref 4.0–10.0)

## 2018-03-03 LAB — COMPREHENSIVE METABOLIC PANEL, BLOOD
ALT (SGPT): 14 U/L (ref 0–41)
AST (SGOT): 27 U/L (ref 0–40)
Albumin: 4.7 g/dL (ref 3.5–5.2)
Alkaline Phos: 74 U/L (ref 40–129)
Anion Gap: 13 mmol/L (ref 7–15)
BUN: 24 mg/dL — ABNORMAL HIGH (ref 8–23)
Bicarbonate: 23 mmol/L (ref 22–29)
Bilirubin, Tot: 1.08 mg/dL (ref <1.2)
Calcium: 8.9 mg/dL (ref 8.5–10.6)
Chloride: 100 mmol/L (ref 98–107)
Creatinine: 1.63 mg/dL — ABNORMAL HIGH (ref 0.67–1.17)
GFR: 43 mL/min
Glucose: 113 mg/dL — ABNORMAL HIGH (ref 70–99)
Potassium: 3.7 mmol/L (ref 3.5–5.1)
Sodium: 136 mmol/L (ref 136–145)
Total Protein: 7.7 g/dL (ref 6.0–8.0)

## 2018-03-03 LAB — MAGNESIUM, BLOOD: Magnesium: 2 mg/dL (ref 1.6–2.4)

## 2018-03-03 MED ORDER — GUAIFENESIN-CODEINE 100-10 MG/5ML OR SYRP
5.00 mL | ORAL_SOLUTION | Freq: Three times a day (TID) | ORAL | 0 refills | Status: AC | PRN
Start: 2018-03-03 — End: 2018-03-10

## 2018-03-03 MED ORDER — BENZONATATE 100 MG OR CAPS
100.00 mg | ORAL_CAPSULE | Freq: Once | ORAL | Status: AC
Start: 2018-03-03 — End: 2018-03-03
  Administered 2018-03-03: 100 mg via ORAL
  Filled 2018-03-03: qty 1

## 2018-03-03 MED ORDER — ACETAMINOPHEN 325 MG PO TABS
650.0000 mg | ORAL_TABLET | Freq: Once | ORAL | Status: AC
Start: 2018-03-03 — End: 2018-03-03
  Administered 2018-03-03: 650 mg via ORAL
  Filled 2018-03-03: qty 2

## 2018-03-03 NOTE — Discharge Instructions (Signed)
Flu-Like Illness    You have been diagnosed with a flu-like illness.    A flu-like illness is caused by a virus. A viral infection is different from a bacterial infection because it cannot be killed with antibiotics. Viral infections are much more common than bacterial infections. Viral infections cause conditions like common cold, bronchitis, mononucleosis (mono) and pneumonia.     Common symptoms of a flu-like illness are:   Fever (temperature higher than 100.26F or 38C) and chills.   Cough.   Sore throat.   Headache.   Nausea (sick to the stomach) or vomiting (throwing up).   Diarrhea.   Muscle pains (myalgias).    At this time, it doesnt seem your symptoms are caused by anything dangerous. You do not need to stay in the hospital. You do not need treatment with antibiotics.    Though we dont believe your condition is dangerous right now, it is important to be careful. Sometimes a problem that seems mild can become serious later. This is why it is very important that you return here or go to the nearest Emergency Department if you are not improving or your symptoms are getting worse.    Some things you can try at home to improve symptoms are:   Acetaminophen (Tylenol) or NSAIDS like ibuprofen (Motrin) or naproxen (Aleve).   Over-the-counter decongestants.   Over-the-counter cough medications.    YOU SHOULD SEEK MEDICAL ATTENTION IMMEDIATELY, EITHER HERE OR AT THE NEAREST EMERGENCY DEPARTMENT, IF ANY OF THE FOLLOWING OCCUR:     Your symptoms get better and then come back or get worse. This may mean a bacterial infection is forming.   You have chest pain or shortness of breath.   You can't keep fluids down or your vomit is dark green.   You throw up blood or see blood in your stool. Blood might be bright red or dark red. It can also be black and look like tar.   You have a severe headache or notice you cannot bend your neck.    If you can't follow up with your doctor, or if at any time  you feel you need to be rechecked or seen again, come back here or go to the nearest emergency department.             - Take tamiflu as prescribed  - Take Robitussin for cough  - Drink plenty of water

## 2018-03-03 NOTE — ED Notes (Signed)
12 lead EKG completed ans signed by Dr. Emmit Pomfret. NO STEMI, no new orders at this time. Copy placed in EKG binder

## 2018-03-03 NOTE — ED Provider Notes (Signed)
Emergency Department Note  [facility] electronic medical record reviewed for pertinent medical history.     Nursing Triage Note:   Chief Complaint   Patient presents with    Flu Like Symptoms     Pt c/o flu symptoms x 1 wk, confirmed possitive by George Ina, pt reports temp at home of 103. Pt c/o sob, generalized body aches, and lymph swelling       HPI:   67 year old male with a PMH significant for depression, gout, congenital hydronephrosis s/p right nephrectomy who presents with flu like symptoms for 2 days. He reports congestion, rhinorrhea, dry cough, and shortness of breath. States he was seen at [facility] yesterday with a fever to 103. States he tested positive for the flu and was sent home with Tamiflu. He states that he has had his care at [facility] previously for his kidney and only went to [place] because it was convenient. He wanted to come to [place] today so that all of his care could be done here. Has not had any worsening of his symptoms. Feels like the tamiflu isn't working. States he is primarily concerned about his "bronchospasms". Reports that he is currently living with his sister, brother in law and niece all of whom have been sick.     Endorses fevers, chills, and sore throat from coughing. Cough is dry, denies hemoptysis. Denies chest pain. Denies n/v/abdominal pain/constipation/diarrhea.     HPI    Past Medical History:   Diagnosis Date    Chronic suprapubic catheter (CMS-HCC)     Congenital hydronephrosis     Gout     Headache     Hematuria     Kidney disease     Kidney stones     Major depressive disorder, single episode     Polyarthropathy or polyarthritis of multiple sites     Urethral stricture        Past Surgical History:   Procedure Laterality Date    APPENDECTOMY      CT INSERTION OF SUPRAPUBIC CATH  09/25/2015    CYSTOSCOPY      CYSTOSCOPY W/ LASER LITHOTRIPSY      NEPHRECTOMY Right 1995    OTHER SURGICAL HISTORY      Interstim 01/29/2011       Family History:      Family  History   Adopted: Yes   Family history unknown: Yes       Social History:  -tob, -EtOH, -drugs  lives with family in [place] with sister, brother in Sports [employer], and niece  Not working    Social History     Tobacco Use    Smoking status: Never Smoker    Smokeless tobacco: Never Used   Substance Use Topics    Alcohol use: Not on file    Drug use: Not on file       Medications:   Prior to Admission Medications   Prescriptions Last Dose Informant Patient Reported? Taking?   HYDROcodone-acetaminophen (NORCO) 5-325 MG tablet   No No   Sig: TAKE ONE TABLET BY MOUTH EVERY SIX HOURS AS NEEDED FOR PAIN   Ibuprofen-Diphenhydramine Cit (ADVIL PM PO)   Yes No   Sig: as needed (1 qhs).   allopurinol (ZYLOPRIM) 300 MG tablet   Yes No   Sig: Take 300 mg by mouth daily.   amLODIPINE (NORVASC) 5 MG tablet   Yes No   Sig: Take 10 mg by mouth daily.   atenolol (TENORMIN) 25 MG  tablet   Yes No   Sig: Take 25 mg by mouth daily.   docusate sodium (COLACE) 250 MG capsule   No No   Sig: TAKE 1 CAPSULE BY MOUTH 2 TIMES DAILY.   ibuprofen (MOTRIN) 200 MG tablet   Yes No   Sig: Take 200 mg by mouth every 6 hours as needed for Mild Pain (Pain Score 1-3).   meloxicam (MOBIC) 7.5 MG tablet   Yes No   Sig:     oxybutynin (DITROPAN) 5 MG tablet   No No   Sig: TAKE 1 TABLET BY MOUTH 3 TIMES DAILY AS NEEDED (BLADDER SPASM OR PAIN).   phenazopyridine (PYRIDIUM) 100 MG tablet   No No   Sig: TAKE 1 TABLET BY MOUTH 3 TIMES DAILY AS NEEDED FOR MILD PAIN   tizanidine (ZANAFLEX) 4 MG capsule   Yes No   Sig: Take 4 mg by mouth 3 times daily.      Facility-Administered Medications: None       Allergies: Sulfa drugs    Review of Systems:     Review of Systems   Constitutional: Negative for chills, fatigue and fever.   HENT:        See HPI   Eyes: Negative for redness.   Respiratory:        See HPI   Cardiovascular: Negative for chest pain, palpitations and leg swelling.   Gastrointestinal: Negative for abdominal pain, constipation, diarrhea, nausea and  vomiting.   Genitourinary: Negative for dysuria.   Musculoskeletal: Positive for arthralgias and myalgias.        Endorses chronic arthritis   Skin: Negative.    Neurological: Negative for dizziness, weakness, light-headedness and headaches.     All other systems reviewed and negative unless otherwise noted in the HPI or above. This was done per my custom and practice for systems appropriate to the chief complaint in an emergency department setting and varies depending on the quality of history that the patient is able to provide.      Physical Exam:   03/03/18  1401 03/03/18  1643   BP: 107/62 115/63   Pulse: 89 88   Resp: 18 18   Temp: (!) 100.5 F (38.1 C) 100.2 F (37.9 C)   SpO2: 93% 96%     Nursing note and vitals reviewed.     Physical Exam   Constitutional: He is oriented to person, place, and time. He appears well-developed and well-nourished. No distress.   HENT:   Head: Normocephalic and atraumatic.   Right Ear: External ear normal.   Left Ear: External ear normal.   Mouth/Throat: Oropharynx is clear and moist. No oropharyngeal exudate.   Mildly erythematous posterior pharynx   Eyes: Conjunctivae and EOM are normal. Pupils are equal, round, and reactive to light.   Cardiovascular: Normal rate, regular rhythm, normal heart sounds and intact distal pulses.   Pulmonary/Chest: Effort normal and breath sounds normal. No stridor. No respiratory distress. He has no wheezes. He has no rales.   Abdominal: Soft. There is no tenderness.   Ventral hernia present   Musculoskeletal: He exhibits no edema.   Neurological: He is alert and oriented to person, place, and time.   Skin: Skin is warm and dry. He is not diaphoretic.       Workup Review:     CXR  IMPRESSION:  Streaky bibasal opacities which may represent atelectasis although pneumonia is possible.    EKG  CBC, BMP  Rapid flu  Impression & Initial ED Plan:  67 year old  male presents with PMH significant for depression, gout, congenital hydronephrosis s/p  right nephrectomy who presents with flu-like symptoms for 2 day duration. Patient does not appear to be in respiratory distress, lungs clear on exam, satting well on RA, hemodynamically stable and CXR unremarkable. Mild fever to 100.6 upon admission however resolved during course of ED stay. No leukocytosis. Rapid flu positive.     Tessalon given for cough. Patient advised that we recommend the same treatment that [facility] recommended which is Tamiflu and medications for symptom management. He states he does not respond to tessalon and thus we will prescribe Robitussin for discharge.        I have discussed my evaluation and care plan for the patient with the attending physician Dr. Emmit Pomfret.     Armond Hang, MD  Resident  03/03/18 2123       Orpah Cobb, MD  03/08/18 [phone]

## 2018-03-03 NOTE — ED Notes (Signed)
DC instructions given to pt along with rx. Pt verbalizes understanding. Stable on discharge.

## 2018-03-03 NOTE — ED Notes (Signed)
MD at bedside for exam

## 2018-03-04 LAB — HIV 1/2 ANTIBODY & P24 ANTIGEN ASSAY, BLOOD: HIV 1/2 Antibody & P24 Antigen Assay: NONREACTIVE

## 2018-03-06 ENCOUNTER — Telehealth (HOSPITAL_BASED_OUTPATIENT_CLINIC_OR_DEPARTMENT_OTHER): Payer: Self-pay | Admitting: Urology

## 2018-03-06 NOTE — Telephone Encounter (Signed)
Called pt. LVM.    When pt calls back.  - why is the pt requesting a urine analysis?  - is pt experiencing any symptoms?

## 2018-03-06 NOTE — Telephone Encounter (Signed)
Pt is calling and would like order for Urine analysis.    Please assist

## 2018-03-07 ENCOUNTER — Ambulatory Visit: Payer: Medicaid Other | Attending: Urology

## 2018-03-07 DIAGNOSIS — R339 Retention of urine, unspecified: Secondary | ICD-10-CM | POA: Insufficient documentation

## 2018-03-07 DIAGNOSIS — Z9359 Other cystostomy status: Secondary | ICD-10-CM | POA: Insufficient documentation

## 2018-03-07 NOTE — Telephone Encounter (Signed)
Left message on vm per dpr informing pt his insurance does not cover sildenafil for ED so he will have to pay out of pocket.

## 2018-03-07 NOTE — Telephone Encounter (Addendum)
We don't need to do PAs for generic sildenafil for ED - as insurance will never cover this. I discuss this with patients when prescribed for ED. Just let pt know he will have to pay out of pocket.

## 2018-03-07 NOTE — Telephone Encounter (Signed)
Received faxed PA denial stating policy does not allow coverage of the requested medication unless the pt has dx of pulmonary arterial hypertension or secondary Raynaud's phenomenon.

## 2018-03-07 NOTE — Interdisciplinary (Signed)
PER MD STANDING ORDER  Preparation:  Pt here for routine  16 F catheter change   Allergies verified with patient  Procedure explained  Patient verbalized understanding with no  objections  Patient positioned and prepped for procedure.      Procedure:  Verified the following;   Correct patient  Correct procedure  Correct side and site  Correct position   Removed old 16  foley catheter. . Deflated 9 ml of sterile water from catheter balloon  Prepped patient in sterile fashion with betadine prep, cleanse procedure site           Cleansed insertion site with betadine. Inserted foley  aseptically w/o any difficulty 10mls of sterile water  Inflated catheter  balloon. 100mL clear urine drained. Irrigated with sterile water . Catheter was connected to leg/ overnight bag       Post-procedure:  Patient tolerated procedure well.   Foley cath care instructions given to patient.   Verbalized good understanding of instructions given  Patient was discharged in stable condition   Pt will follow up on 3-4 weeks time

## 2018-03-09 ENCOUNTER — Telehealth: Payer: Self-pay | Admitting: Cardiovascular Disease

## 2018-03-09 NOTE — Telephone Encounter (Signed)
Lmov for patient to call back need to reschedule Echo It was scheduled on a Vascular only day  Will try again at a later time

## 2018-03-12 NOTE — Telephone Encounter (Signed)
Lmov for patient to call back need to reschedule Echo It was scheduled on a Vascular only day  Will try again at a later time

## 2018-03-14 ENCOUNTER — Telehealth (HOSPITAL_BASED_OUTPATIENT_CLINIC_OR_DEPARTMENT_OTHER): Payer: Self-pay | Admitting: Urology

## 2018-03-14 NOTE — Telephone Encounter (Signed)
I contacted patient on phone number: [phone]. There's no answer. I left patient a message that I had a surgery cancellation for 4/30 and Legrand Como with [employer] is available  but only in the morning. Advise for patient to return my call if he wishes to reschedule his surgical procedure.     Case Request Order Details   Procedure: INSERTION, NEUROSTIMULATOR, SACRAL REMOVAL    Case Scheduling Comments  Need interstim rep and device for removal.

## 2018-03-15 ENCOUNTER — Encounter: Payer: Self-pay | Admitting: Cardiovascular Disease

## 2018-03-15 NOTE — Telephone Encounter (Signed)
lmov to reschedule echo.  Cancelled appt. Mailed Letter

## 2018-03-16 NOTE — Telephone Encounter (Signed)
Surgery letter, ASA precaution list and pre-operative instructions have been emailed to patient: [email or web address]     Patient also aware NPO after midnight prior to surgery.     SURGERY PLAN:  Surgery Date: [facility] 4/30  Check in time: 5:30am  [place]: MD to complete H&P morning or surgery.  Planned Post-op Destination: Discharge to Home

## 2018-03-22 ENCOUNTER — Other Ambulatory Visit (INDEPENDENT_AMBULATORY_CARE_PROVIDER_SITE_OTHER): Payer: BLUE CROSS/BLUE SHIELD

## 2018-03-22 DIAGNOSIS — Z1211 Encounter for screening for malignant neoplasm of colon: Secondary | ICD-10-CM | POA: Diagnosis not present

## 2018-03-22 LAB — FECAL OCCULT BLOOD, IMMUNOCHEMICAL: Fecal Occult Bld: NEGATIVE

## 2018-03-22 LAB — FECAL OCCULT BLOOD, GUAIAC: Fecal Occult Blood: NEGATIVE

## 2018-03-23 ENCOUNTER — Encounter: Payer: Self-pay | Admitting: Family Medicine

## 2018-03-26 ENCOUNTER — Other Ambulatory Visit: Payer: BLUE CROSS/BLUE SHIELD

## 2018-03-29 ENCOUNTER — Ambulatory Visit (INDEPENDENT_AMBULATORY_CARE_PROVIDER_SITE_OTHER): Payer: BLUE CROSS/BLUE SHIELD

## 2018-03-29 ENCOUNTER — Other Ambulatory Visit: Payer: Self-pay

## 2018-03-29 DIAGNOSIS — I351 Nonrheumatic aortic (valve) insufficiency: Secondary | ICD-10-CM | POA: Diagnosis not present

## 2018-04-02 ENCOUNTER — Telehealth (HOSPITAL_BASED_OUTPATIENT_CLINIC_OR_DEPARTMENT_OTHER): Payer: Self-pay | Admitting: Urology

## 2018-04-02 NOTE — Telephone Encounter (Signed)
PT is calling to return Sue's call. PT advised that this is in regards to his surgery with Dr. Neita Goodnight    Please advise: [phone]

## 2018-04-03 NOTE — Telephone Encounter (Signed)
Spoke with patient, informed him I had a cancellation for next Tuesday. patient has decided to move his surgical procedure up for 4/23    Surgery plan:  Date: 04/10/2018 [facility]  Check in time: 5:30am  Surgery time: 7:20am

## 2018-04-04 ENCOUNTER — Encounter (INDEPENDENT_AMBULATORY_CARE_PROVIDER_SITE_OTHER): Payer: Self-pay | Admitting: Otolaryngology

## 2018-04-04 ENCOUNTER — Ambulatory Visit (INDEPENDENT_AMBULATORY_CARE_PROVIDER_SITE_OTHER): Payer: Medicaid Other | Admitting: Otolaryngology

## 2018-04-04 ENCOUNTER — Ambulatory Visit: Payer: Medicaid Other | Attending: Urology

## 2018-04-04 VITALS — BP 124/64 | HR 60 | Temp 98.4°F | Resp 18 | Ht 66.0 in

## 2018-04-04 DIAGNOSIS — S01512A Laceration without foreign body of oral cavity, initial encounter: Secondary | ICD-10-CM

## 2018-04-04 DIAGNOSIS — N35013 Post-traumatic anterior urethral stricture: Secondary | ICD-10-CM | POA: Insufficient documentation

## 2018-04-04 DIAGNOSIS — K148 Other diseases of tongue: Secondary | ICD-10-CM

## 2018-04-04 DIAGNOSIS — R339 Retention of urine, unspecified: Principal | ICD-10-CM | POA: Insufficient documentation

## 2018-04-04 MED ORDER — LIDOCAINE VISCOUS 2 % MT SOLN
10.0000 mL | Freq: Four times a day (QID) | OROMUCOSAL | 0 refills | Status: AC
Start: 2018-04-04 — End: 2018-04-18

## 2018-04-04 NOTE — Patient Instructions (Signed)
SEE DENTIST ASAP.

## 2018-04-04 NOTE — Telephone Encounter (Signed)
Spoke with patient, informed him I had a cancellation for next Tuesday. patient has decided to move his surgical procedure up for 4/23    Surgery plan:  Date: 04/10/2018 [facility]  Check in time: 5:30am  Surgery time: 7:20am

## 2018-04-04 NOTE — Interdisciplinary (Signed)
Patient presents to clinic for SPT cath change, per standing order from MD. Patient ambulatory to clinic and in no acute distress.   No new symptoms or concerns reported.     Preparation:  Allergies verified with patient  Procedure explained  Patient verbalized understanding without objections  Patient positioned and prepped for procedure    Procedure:  16 Fr SPT catheter capped. Drained bladder prior to catheter reinsertion - approx 167ml in bladder.  16Fr SPT catheter discontinued with no difficulty after fully deflating cath balloon - 10 mls of water aspirated from cath balloon. Patient tolerated well with minimal discomfort. Under sterile technique patient's SPT site prepped with betadine. 16 Fr indwelling catheter inserted into bladder with no difficulty. 10 mls of sterile water instilled within cath balloon. Clear, yellow urine drained.     Patient tolerated procedure well with no discomfort. Catheter re-capped, per patient, and secured with stat lock. Patient stable at discharge.

## 2018-04-04 NOTE — Progress Notes (Signed)
[facility]     CHIEF COMPLAINT:  Tongue pain    HISTORY OF PRESENT ILLNESS:  This is a 67 year old male who is referred by Dr. Areta Haber for evaluation of Tongue mass. Several weeks ago had bronchopneumonia. Was given abx then developed thrush. Took nystatin but tongue has remained sore. Patient with bad gag. Has been unable to see dentist. Does not drink or smoke. No prior cancer.    Denies fevers / chills / weight loss / night sweats / loss of appetite.    Past Medical History:   Diagnosis Date    Chronic suprapubic catheter (CMS-HCC)     Congenital hydronephrosis     Gout     Headache     Hematuria     Kidney disease     Kidney stones     Major depressive disorder, single episode     Polyarthropathy or polyarthritis of multiple sites     Urethral stricture        Past Surgical History:   Procedure Laterality Date    APPENDECTOMY      CT INSERTION OF SUPRAPUBIC CATH  09/25/2015    CYSTOSCOPY      CYSTOSCOPY W/ LASER LITHOTRIPSY      NEPHRECTOMY Right 1995    OTHER SURGICAL HISTORY      Interstim 01/29/2011       Current Outpatient Medications on File Prior to Visit   Medication Sig Dispense Refill    allopurinol (ZYLOPRIM) 300 MG tablet Take 300 mg by mouth daily.      amLODIPINE (NORVASC) 5 MG tablet Take 10 mg by mouth daily.      atenolol (TENORMIN) 25 MG tablet Take 25 mg by mouth daily.      docusate sodium (COLACE) 250 MG capsule TAKE 1 CAPSULE BY MOUTH 2 TIMES DAILY. 20 each 0    HYDROcodone-acetaminophen (NORCO) 5-325 MG tablet TAKE ONE TABLET BY MOUTH EVERY SIX HOURS AS NEEDED FOR PAIN 12 each 0    ibuprofen (MOTRIN) 200 MG tablet Take 200 mg by mouth every 6 hours as needed for Mild Pain (Pain Score 1-3).      Ibuprofen-Diphenhydramine Cit (ADVIL PM PO) as needed (1 qhs).      meloxicam (MOBIC) 7.5 MG tablet        oxybutynin (DITROPAN) 5 MG tablet TAKE 1 TABLET BY MOUTH 3 TIMES DAILY AS NEEDED (BLADDER SPASM OR PAIN). 15 each 0     phenazopyridine (PYRIDIUM) 100 MG tablet TAKE 1 TABLET BY MOUTH 3 TIMES DAILY AS NEEDED FOR MILD PAIN 20 each 0    tizanidine (ZANAFLEX) 4 MG capsule Take 4 mg by mouth 3 times daily.       No current facility-administered medications on file prior to visit.        Allergies   Allergen Reactions    Sulfa Drugs Unspecified       Social History     Socioeconomic History    Marital status: Single     Spouse name: Not on file    Number of children: Not on file    Years of education: Not on file    Highest education level: Not on file   Occupational History    Not on file   Social Needs    Financial resource strain: Not on file    Food insecurity:     Worry: Not on file     Inability: Not  on file    Transportation needs:     Medical: Not on file     Non-medical: Not on file   Tobacco Use    Smoking status: Never Smoker    Smokeless tobacco: Never Used   Substance and Sexual Activity    Alcohol use: Not on file    Drug use: Not on file    Sexual activity: No   Lifestyle    Physical activity:     Days per week: Not on file     Minutes per session: Not on file    Stress: Not on file   Relationships    Social connections:     Talks on phone: Not on file     Gets together: Not on file     Attends religious service: Not on file     Active member of club or organization: Not on file     Attends meetings of clubs or organizations: Not on file     Relationship status: Not on file    Intimate partner violence:     Fear of current or ex partner: Not on file     Emotionally abused: Not on file     Physically abused: Not on file     Forced sexual activity: Not on file   Other Topics Concern    Not on file   Social History Narrative    Not on file       ROS:  A 14 system review was performed. All systems are negative, excepts as detailed in the HPI above.    PHYSICAL EXAMINATION  BP 124/64    Pulse 60    Temp 98.4 F (36.9 C) (Oral)    Resp 18    Ht 5\' 6"  (1.676 m)    SpO2 99%    BMI 33.49 kg/m   VOICE:  Mild  roughness  GENERAL:  No apparent distress.  Normal affect.  EARS:  Right:  External auditory canal patent.  Tympanic membrane intact.  Middle ear aerated.  Left:  External auditory canal patent.  Tympanic membrane intact.  Middle ear aerated.  NOSE: clear anteriorly.  Nasal mucosa healthy.  Septum midline.  ORAL CAVITY: no masses or ulcerations. Right lateral tongue ulcer, easily bleeds; adjacent molar is very sharp and appears to be culprit  OROPHARYNX: no masses or ulcerations.  Palate elevates midline.  NECK: No masses.  No cervical adenopathy.  No thyroid masses.  NEURO:  CN's V, VII, and XII intact and symmetrical.    PROCEDURE:  Flexible Laryngoscopy:    Flexible laryngoscopy was performed.  After anesthetization and verbal consent, an endoscope was inserted through the right nasal pasage.  The nasopharynx, base of tongue, pyriform sinus, and post-cricoid region were free of masses, lesions, and ulcerations.  The vocal folds adduct and abduct normally and symmetrically.  There is a patent glottic airway.  There is no edema and no erythema noted .  Endolaryngeal examination reveals no masses or lesions.    Right tongue laceration - adjacent to broken molar - very sharp    Patient Questionnaire   No flowsheet data found.   No flowsheet data found.   No flowsheet data found.   No flowsheet data found.     Daily hydration: No flowsheet data found.     ASSESSMENT &  PLAN:  (1). Tongue sore appears to be traumatic related to sharp and broken right lower molar  Dental referral ASAP  Magic mouthwash  RTC if  does not improve    Thank you for the referral of this patient to the [facility] [place].  Should you have any questions about the care of this patient or any other, please call us at [phone].

## 2018-04-06 ENCOUNTER — Telehealth (INDEPENDENT_AMBULATORY_CARE_PROVIDER_SITE_OTHER): Payer: Self-pay | Admitting: Otolaryngology

## 2018-04-06 NOTE — Telephone Encounter (Signed)
Called pt- left voicemail message with info requested.

## 2018-04-06 NOTE — Telephone Encounter (Signed)
Pt called requesting the spelling of name and the address of the oral surgeon he was recommended to see by Dr. Birdie Sons.    Pt stated Dr. Glenetta Hew in the [facility] center?       Please leave the spelling of the name and the clinic address on pts VM [phone] pt is trynig to email the oral surgeon thank you.

## 2018-04-09 ENCOUNTER — Telehealth (HOSPITAL_BASED_OUTPATIENT_CLINIC_OR_DEPARTMENT_OTHER): Payer: Self-pay | Admitting: Urology

## 2018-04-09 NOTE — Telephone Encounter (Signed)
Pt is calling states he is scheduled for surgery tomorrow with Dr. Neita Goodnight.    Pt states he didn't get anything in regards to his surgery instructions and preparations.    Pt states his surgery date/ time changed but wants to be sure and confirm.    Informed pt of per Sue's note from TE 3/29 but request to speak with Collie Siad for clarification.    Please review and return call back to pt at  [phone] to further assist and discuss.

## 2018-04-09 NOTE — Telephone Encounter (Signed)
Spoke with patient and confirmed his surgery for tomorrow.    Aware NPO after midnight prior to surgery.    Surgery plan:  Date: 4/23 [facility]  Check in time: 5:30am  Surgery time: 7:20am

## 2018-04-10 ENCOUNTER — Encounter (HOSPITAL_COMMUNITY): Admission: RE | Disposition: A | Discharge status: Home Health | Payer: Self-pay | Attending: Urology

## 2018-04-10 ENCOUNTER — Other Ambulatory Visit: Payer: Self-pay

## 2018-04-10 ENCOUNTER — Ambulatory Visit (HOSPITAL_BASED_OUTPATIENT_CLINIC_OR_DEPARTMENT_OTHER): Payer: Medicaid Other | Admitting: Anesthesiology

## 2018-04-10 ENCOUNTER — Ambulatory Visit (HOSPITAL_BASED_OUTPATIENT_CLINIC_OR_DEPARTMENT_OTHER): Payer: Medicaid Other

## 2018-04-10 ENCOUNTER — Ambulatory Visit
Admission: RE | Admit: 2018-04-10 | Discharge: 2018-04-10 | Disposition: A | Discharge status: Home / Self Care | Payer: Medicaid Other | Attending: Urology | Admitting: Urology

## 2018-04-10 ENCOUNTER — Ambulatory Visit (HOSPITAL_COMMUNITY): Payer: Medicaid Other | Admitting: Anesthesiology

## 2018-04-10 DIAGNOSIS — R339 Retention of urine, unspecified: Secondary | ICD-10-CM | POA: Insufficient documentation

## 2018-04-10 DIAGNOSIS — N35919 Unspecified urethral stricture, male, unspecified site: Secondary | ICD-10-CM | POA: Insufficient documentation

## 2018-04-10 DIAGNOSIS — Z882 Allergy status to sulfonamides status: Secondary | ICD-10-CM | POA: Insufficient documentation

## 2018-04-10 DIAGNOSIS — F329 Major depressive disorder, single episode, unspecified: Secondary | ICD-10-CM | POA: Insufficient documentation

## 2018-04-10 DIAGNOSIS — Z95818 Presence of other cardiac implants and grafts: Secondary | ICD-10-CM | POA: Insufficient documentation

## 2018-04-10 DIAGNOSIS — T85191S Other mechanical complication of implanted electronic neurostimulator (electrode) of peripheral nerve, sequela: Secondary | ICD-10-CM

## 2018-04-10 DIAGNOSIS — N289 Disorder of kidney and ureter, unspecified: Secondary | ICD-10-CM | POA: Insufficient documentation

## 2018-04-10 DIAGNOSIS — Z87442 Personal history of urinary calculi: Secondary | ICD-10-CM | POA: Insufficient documentation

## 2018-04-10 DIAGNOSIS — Z87448 Personal history of other diseases of urinary system: Secondary | ICD-10-CM

## 2018-04-10 DIAGNOSIS — M109 Gout, unspecified: Secondary | ICD-10-CM | POA: Insufficient documentation

## 2018-04-10 DIAGNOSIS — Z79899 Other long term (current) drug therapy: Secondary | ICD-10-CM | POA: Insufficient documentation

## 2018-04-10 DIAGNOSIS — Z462 Encounter for fitting and adjustment of other devices related to nervous system and special senses: Secondary | ICD-10-CM

## 2018-04-10 SURGERY — INSERTION, NEUROSTIMULATOR, SACRAL
Anesthesia: Monitored Anesthesia Care (MAC) | Wound class: Class I (Clean)

## 2018-04-10 MED ORDER — FENTANYL CITRATE (PF) 100 MCG/2ML IJ SOLN
50.0000 ug | INTRAMUSCULAR | Status: DC | PRN
Start: 2018-04-10 — End: 2018-04-10

## 2018-04-10 MED ORDER — ACETAMINOPHEN 10 MG/ML IV SOLN
INTRAVENOUS | Status: DC | PRN
Start: 2018-04-10 — End: 2018-04-10
  Administered 2018-04-10: 1000 mg via INTRAVENOUS

## 2018-04-10 MED ORDER — LABETALOL HCL 5 MG/ML IV SOLN
5.0000 mg | INTRAVENOUS | Status: DC | PRN
Start: 2018-04-10 — End: 2018-04-10

## 2018-04-10 MED ORDER — DOCUSATE SODIUM 250 MG OR CAPS
ORAL_CAPSULE | ORAL | 0 refills | Status: DC
Start: 2018-04-10 — End: 2019-08-23
  Filled 2018-04-10: qty 60, 30d supply, fill #0

## 2018-04-10 MED ORDER — EPHEDRINE SULFATE 50 MG/ML IJ SOLN
INTRAMUSCULAR | Status: DC | PRN
Start: 2018-04-10 — End: 2018-04-10
  Administered 2018-04-10: 10 mg via INTRAVENOUS

## 2018-04-10 MED ORDER — LIDOCAINE HCL (CARDIAC) 20 MG/ML IV SOLN
INTRAVENOUS | Status: DC | PRN
Start: 2018-04-10 — End: 2018-04-10
  Administered 2018-04-10: 40 mg via INTRAVENOUS

## 2018-04-10 MED ORDER — ONDANSETRON HCL 4 MG/2ML IV SOLN
4.0000 mg | Freq: Once | INTRAMUSCULAR | Status: DC | PRN
Start: 2018-04-10 — End: 2018-04-10

## 2018-04-10 MED ORDER — ONDANSETRON HCL 4 MG/2ML IV SOLN
INTRAMUSCULAR | Status: DC | PRN
Start: 2018-04-10 — End: 2018-04-10
  Administered 2018-04-10: 4 mg via INTRAVENOUS

## 2018-04-10 MED ORDER — PROPOFOL 200 MG/20ML IV EMUL
INTRAVENOUS | Status: DC | PRN
Start: 2018-04-10 — End: 2018-04-10
  Administered 2018-04-10: 20 mg via INTRAVENOUS
  Administered 2018-04-10: 30 mg via INTRAVENOUS

## 2018-04-10 MED ORDER — TRAMADOL HCL 50 MG OR TABS
ORAL_TABLET | ORAL | 0 refills | Status: DC
Start: 2018-04-10 — End: 2019-08-23
  Filled 2018-04-10: qty 10, 2d supply, fill #0

## 2018-04-10 MED ORDER — NALOXONE HCL 0.4 MG/ML IJ SOLN
0.1000 mg | INTRAMUSCULAR | Status: DC | PRN
Start: 2018-04-10 — End: 2018-04-10

## 2018-04-10 MED ORDER — PROPOFOL 1000 MG/100ML IV EMUL
INTRAVENOUS | Status: DC | PRN
Start: 2018-04-10 — End: 2018-04-10
  Administered 2018-04-10: 25 ug/kg/min via INTRAVENOUS
  Administered 2018-04-10: 75 ug/kg/min via INTRAVENOUS
  Administered 2018-04-10: 50 ug/kg/min via INTRAVENOUS

## 2018-04-10 MED ORDER — STERILE WATER FOR IRRIGATION IR SOLN
Status: AC | PRN
Start: 2018-04-10 — End: 2018-04-10
  Administered 2018-04-10: 08:00:00

## 2018-04-10 MED ORDER — MIDAZOLAM HCL 2 MG/2ML IJ SOLN
INTRAMUSCULAR | Status: DC | PRN
Start: 2018-04-10 — End: 2018-04-10
  Administered 2018-04-10: 1 mg via INTRAVENOUS

## 2018-04-10 MED ORDER — LIDOCAINE HCL (PF) 1 % IJ SOLN
0.1000 mL | Freq: Once | INTRAMUSCULAR | Status: DC | PRN
Start: 2018-04-10 — End: 2018-04-10

## 2018-04-10 MED ORDER — LACTATED RINGERS IV SOLN
INTRAVENOUS | Status: DC | PRN
Start: 2018-04-10 — End: 2018-04-10
  Administered 2018-04-10: 07:00:00 via INTRAVENOUS

## 2018-04-10 MED ORDER — LACTATED RINGERS IV SOLN
INTRAVENOUS | Status: DC
Start: 2018-04-10 — End: 2018-04-10

## 2018-04-10 MED ORDER — OXYCODONE HCL 5 MG OR TABS
5.0000 mg | ORAL_TABLET | Freq: Once | ORAL | Status: DC | PRN
Start: 2018-04-10 — End: 2018-04-10

## 2018-04-10 MED ORDER — LIDOCAINE-EPINEPHRINE 1 %-1:100000 IJ SOLN
INTRAMUSCULAR | Status: DC | PRN
Start: 2018-04-10 — End: 2018-04-10
  Administered 2018-04-10: 20 mL

## 2018-04-10 MED ORDER — CEFAZOLIN SODIUM 1 GM IJ SOLR
INTRAMUSCULAR | Status: DC | PRN
Start: 2018-04-10 — End: 2018-04-10
  Administered 2018-04-10: 2000 mg via INTRAVENOUS

## 2018-04-10 MED ORDER — FENTANYL CITRATE (PF) 100 MCG/2ML IJ SOLN
25.0000 ug | INTRAMUSCULAR | Status: DC | PRN
Start: 2018-04-10 — End: 2018-04-10

## 2018-04-10 SURGICAL SUPPLY — 24 items
BENZOIN TINCTURE AMPULE (Misc Medical Supply) ×2 IMPLANT
CLIPPER BLADE ASSY FOR 9670 CLIPPER (Knives/Blades)
DRAPE IOBAN 2 ANTIMICROBIAL 23" X 17" (Misc Surgical Supply) ×1
DRAPE LAPAROTOMY W/ARMBOARD COVERS (T-DRAPE) (Drapes/towels) ×2
DRAPE ORTHO BAR SHEET 100 X 60" (Drapes/towels) ×2 IMPLANT
FILM IOBAN 2 ANTIMICROBIAL 23" X 17" (Misc Surgical Supply) ×1 IMPLANT
GLOVE BIOGEL INDICATOR UNDERGLOVE SIZE 8 (Gloves/gowns) ×10 IMPLANT
GLOVE BIOGEL SUPER-SENSITIVE SIZE 8 (Gloves/gowns) ×10 IMPLANT
GOWN SIRUS XLG BLUE, AAMI LVL 4 (Gloves/Gowns) ×6 IMPLANT
NEEDLE BD HYPO 27G X 0.5" (Needles/punch/cannula/biopsy) ×2 IMPLANT
NEEDLE INTERSTIM FORAMEN 20GA 5.0" (Needles/punch/cannula/biopsy)
PAD GROUND VALLEYLAB REM ADULT E7507 (Misc Surgical Supply) ×2 IMPLANT
PREP TRAY WET SKIN SCRUB KENDALL (Prep Solutions) ×2
PROTECTOR ULNAR NERVE PAD, YELLOW (Misc Medical Supply) ×2 IMPLANT
SKIN AFFIX 0.4ML HV (Dressings/packing) ×2 IMPLANT
SOLUTION IRR POUR BTL 0.9% NS 1000ML (Non-Pharmacy Meds/Solutions) ×2
SOLUTION IRR POUR BTL H20 1000ML (Non-Pharmacy Meds/Solutions) ×2 IMPLANT
SPONGES BIOSEAL PEANUT IN HOLDER 5/PACK (Sponges) ×2
SURGICAL PACK BASIC MAJOR SET-UP (Procedure Packs/kits) ×2 IMPLANT
SUTURE MONOCRYL PLUS 4-0 27" PS-2 MCP426 (Suture) ×4 IMPLANT
SUTURE PERMA-HAND SILK 2-0 18" TIES A185H (Suture) ×2
SYRINGE 10CC LL CONTRL (Misc Medical Supply) ×2 IMPLANT
TOWELS OR BLUE 4-PACK STERILE, DISPOSABLE (Drapes/towels)
TUBE FRAZIER MEDI-VAC SUCTION 12FR (Tubing/Suction) ×2 IMPLANT

## 2018-04-10 NOTE — Progress Notes (Signed)
Cardiology Office Note  Date:  04/10/2018   ID:  Johnny Holland, DOB 1951-11-12, MRN [id number]  PCP:  Ria Bush, MD   No chief complaint on file.   HPI:   CAD Aortic valve disease COPD Morbid obesity Hyperlipidemia  echo (02/2017) showing calcified aortic valves, mild AS, mod AR, preserved EF 55-65%, mild LV dilation and LVH.    CAD by CT scan (LAD and L circ)   Echo 03/2018 - Mild to moderate aortioc valve reguritation, aortic valve   sclerosis without significant stenosis.  strong fmhx CAD (father MI age 46, alcohol use) . He had stress test 65 years ago - unable to complete treadmill stress test at that time.   Endorses increasing dyspnea and fatigue with exertion, becoming more noticeable. Denies chest pain with this.   Carotid u/s <39% B/L   PMH:   has a past medical history of Aortic regurgitation (10/15/2012), CAD (coronary artery disease) (01/2017), Carotid stenosis (01/12/2016), COPD (chronic obstructive pulmonary disease) ([place]) (01/2017), Diverticulitis, Diverticulosis of colon, Duodenal ulcer, Hepatic steatosis (01/2017), History of smoking, HLD (hyperlipidemia), Right shoulder pain, and Thoracic aorta atherosclerosis ([place]) (01/2017).  PSH:    Past Surgical History:  Procedure Laterality Date  . COLONOSCOPY  11/30/06   diverticulosis; no polyps  . INCISE AND DRAIN ABCESS     Hemmorhoid  . VASECTOMY     x 2    Current Outpatient Medications  Medication Sig Dispense Refill  . aspirin 81 MG tablet Take 1 tablet (81 mg total) by mouth daily.    . pravastatin (PRAVACHOL) 80 MG tablet Take 1 tablet (80 mg total) by mouth at bedtime. 90 tablet 3  . scopolamine (TRANSDERM-SCOP, 1.5 MG,) 1 MG/3DAYS Place 1 patch (1.5 mg total) onto the skin every 3 (three) days. 4 patch 1  . sildenafil (REVATIO) 20 MG tablet Take 4-5 tablets (80-100 mg total) by mouth daily as needed (relations). 30 tablet 3  . sildenafil (VIAGRA) 100 MG tablet TAKE 0.5-1 TABLETS (50-100 MG  TOTAL) BY MOUTH DAILY AS NEEDED FOR ERECTILE DYSFUNCTION. 5 tablet 3   No current facility-administered medications for this visit.      Allergies:   Erythromycin   Social History:  The patient  reports that he quit smoking about 6 years ago. He has a 42.00 pack-year smoking history. He has never used smokeless tobacco. He reports that he drinks alcohol. He reports that he does not use drugs.   Family History:   family history includes Alcohol abuse in his father and sister; Alzheimer's disease in his unknown relative; Breast cancer in his maternal aunt; COPD in his mother; Cataracts in his mother; Cirrhosis in his father; Depression in his sister; Diabetes in his paternal grandmother; Emphysema in his father; Heart attack (age of onset: 75) in his father; Heart failure in his father; Macular degeneration in his mother.    Review of Systems: ROS   PHYSICAL EXAM: VS:  There were no vitals taken for this visit. , BMI There is no height or weight on file to calculate BMI. GEN: Well nourished, well developed, in no acute distress HEENT: normal Neck: no JVD, carotid bruits, or masses Cardiac: RRR; no murmurs, rubs, or gallops,no edema  Respiratory:  clear to auscultation bilaterally, normal work of breathing GI: soft, nontender, nondistended, + BS MS: no deformity or atrophy Skin: warm and dry, no rash Neuro:  Strength and sensation are intact Psych: euthymic mood, full affect    Recent Labs: 02/21/2018: ALT 18; BUN 16;  Creatinine, Ser 1.07; Potassium 4.3; Sodium 137    Lipid Panel Lab Results  Component Value Date   CHOL 166 02/21/2018   HDL 56.10 02/21/2018   LDLCALC 80 02/21/2018   TRIG 149.0 02/21/2018      Wt Readings from Last 3 Encounters:  02/27/18 237 lb (107.5 kg)  02/21/18 230 lb (104.3 kg)  11/20/17 238 lb (108 kg)       ASSESSMENT AND PLAN:  No diagnosis found.   Disposition:   F/U  6 months  No orders of the defined types were placed in this  encounter.    Signed, Esmond Plants, M.D., Ph.D. 04/10/2018  [place]  This encounter was created in error - please disregard.

## 2018-04-10 NOTE — H&P (Signed)
UROLOGY HISTORY & PHYSICAL    CC:  interstim in situ    History of Present Illness:     Stephen [name] is a 67 year old male h/o BNC, underactive bladder s/p interstim who is in need of interstim removal so that he can undergo MRI of lumbar spine. No changes in his health since last visit 02/01/18.    ROS:  Review of Systems   Constitutional: Negative.    HENT: Negative.    Eyes: Negative.    Respiratory: Negative.    Cardiovascular: Negative.    Gastrointestinal: Negative.    Endocrine: Negative.    Genitourinary: Negative.    Musculoskeletal: Negative.    Skin: Negative.    Allergic/Immunologic: Negative.    Neurological: Negative.    Hematological: Negative.    Psychiatric/Behavioral: Negative.      Past Medical and Surgical History:  Past Medical History:   Diagnosis Date    Chronic suprapubic catheter (CMS-HCC)     Congenital hydronephrosis     Gout     Headache     Hematuria     Kidney disease     Kidney stones     Major depressive disorder, single episode     Polyarthropathy or polyarthritis of multiple sites     Urethral stricture      Past Surgical History:   Procedure Laterality Date    APPENDECTOMY      CT INSERTION OF SUPRAPUBIC CATH  09/25/2015    CYSTOSCOPY      CYSTOSCOPY W/ LASER LITHOTRIPSY      NEPHRECTOMY Right 1995    OTHER SURGICAL HISTORY      Interstim 01/29/2011       Allergies:  Allergies   Allergen Reactions    Sulfa Drugs Unspecified       Medications:  No current facility-administered medications on file prior to encounter.      Current Outpatient Medications on File Prior to Encounter   Medication Sig Dispense Refill    allopurinol (ZYLOPRIM) 300 MG tablet Take 300 mg by mouth daily.      amLODIPINE (NORVASC) 5 MG tablet Take 10 mg by mouth daily.      atenolol (TENORMIN) 25 MG tablet Take 25 mg by mouth daily.      docusate sodium (COLACE) 250 MG capsule TAKE 1 CAPSULE BY MOUTH 2 TIMES DAILY. 20 each 0    HYDROcodone-acetaminophen (NORCO) 5-325 MG tablet TAKE ONE  TABLET BY MOUTH EVERY SIX HOURS AS NEEDED FOR PAIN 12 each 0    ibuprofen (MOTRIN) 200 MG tablet Take 200 mg by mouth every 6 hours as needed for Mild Pain (Pain Score 1-3).      Ibuprofen-Diphenhydramine Cit (ADVIL PM PO) as needed (1 qhs).      meloxicam (MOBIC) 7.5 MG tablet        oxybutynin (DITROPAN) 5 MG tablet TAKE 1 TABLET BY MOUTH 3 TIMES DAILY AS NEEDED (BLADDER SPASM OR PAIN). 15 each 0    phenazopyridine (PYRIDIUM) 100 MG tablet TAKE 1 TABLET BY MOUTH 3 TIMES DAILY AS NEEDED FOR MILD PAIN 20 each 0    tizanidine (ZANAFLEX) 4 MG capsule Take 4 mg by mouth 3 times daily.         Social History:  Social History     Socioeconomic History    Marital status: Single     Spouse name: Not on file    Number of children: Not on file    Years of education: Not  on file    Highest education level: Not on file   Occupational History    Not on file   Tobacco Use    Smoking status: Never Smoker    Smokeless tobacco: Never Used   Substance and Sexual Activity    Alcohol use: Not on file    Drug use: Not on file    Sexual activity: No   Social Activities of Daily Living Present    Not on file   Social History Narrative    Not on file       Family History:  Family History   Adopted: Yes   Family history unknown: Yes       ----------------------------------------------------------------------------------------------    Physical Exam:  BP  Min: 125/77  Max: 125/77  Temp  Min: 97.3 F (36.3 C)  Max: 97.3 F (36.3 C)  Pulse  Min: 50  Max: 50  Resp  Min: 15  Max: 15  SpO2  Min: 99 %  Max: 99 %  Height  Min: 5\' 8"  (172.7 cm)  Max: 5\' 8"  (172.7 cm)  Weight  Min: 89.1 kg (196 lb 6.4 oz)  Max: 89.1 kg (196 lb 6.4 oz)    No intake/output data recorded.  GENERAL: Pleasant, cooperative and in no acute distress.   NEURO: Alert and Oriented x 3  HEENT: normalcephalic/atraumatic  NECK: midline trachea  PULM: non-labored breathing  CV: distal pulses intact  GI: soft, NT, ND  BACK: No CVAT  SKIN: no obvious rashes or  lesions  EXT: warm and well perfused.   PSYCH: mood appropriate     Labs:   Lab Results   Component Value Date    WBC 7.3 03/03/2018    RBC 4.15 (L) 03/03/2018    HGB 12.9 (L) 03/03/2018    HCT 38.5 (L) 03/03/2018    MCV 92.8 03/03/2018    MCHC 33.5 03/03/2018    RDW 13.8 03/03/2018    PLT 142 03/03/2018    MPV 10.2 03/03/2018       Lab Results   Component Value Date    NA 136 03/03/2018    K 3.7 03/03/2018    CL 100 03/03/2018    BICARB 23 03/03/2018    BUN 24 (H) 03/03/2018    CREAT 1.63 (H) 03/03/2018    GLU 113 (H) 03/03/2018    [place] 8.9 03/03/2018       ______________________________________________________________________  Assessment and Plan:  66 M w interstim in situ, here for interstim removal.    Proceed to OR.    Discussed with Dr. Neita Goodnight, attending of record.    ---  [name]. Jacqlyn Larsen, [facility], PGY3    Urology pagers  Please do not hesitate to contact the appropriate service with any questions or concerns.  [facility] ([facility]): [phone]  [facility]: [phone]

## 2018-04-10 NOTE — Op Note (Signed)
OPERATIVE NOTE    CASE ID: [id number]    DATE OF OPERATION: 04/10/2018    PREOPERATIVE DIAGNOSIS: History of Interstim placement    POSTOPERATIVE DIAGNOSIS: Same    PROCEDURE:  REMOVAL OF INTERSTIM NEUROSTIMULATOR LEAD AND BATTERY    Surgeon(s) and Role:     * Albo, Michael Edward, MD - Primary     * Greear, Garrick McLeod, MD - Resident - Assisting    ANESTHESIA: MAC    Anesthesiologist: Abanobi, Maryann U, MD     OR Staff:  Circulator: Padilla, Jose Jr.; Sorrel, Joshua Eusantos, RN  Scrub: Hagedon, Troy Brooks; Kennedy, Mark  X-Ray Tech: Macherzak, Terry    FINDINGS: Lead and battery/pulse generator remove intact in its entirety    INDICATIONS FOR PROCEDURE: The patient is a 67 year old male with a history of voiding dysfunction who had undergone prior Interstim placement and has not had good function from the device. He is in need of a lumbar spine MRI and desires device removal.    PROCEDURE IN DETAIL:  The patient was met and examined in the preoperative area. The indications, risks, benefits, and alternatives were explained to the patient.  All questions were answered and informed consent was obtained. The patient was then brought to the operating room and positioned supine on the surgical bed. Anesthesia was induced. The patient was placed in a prone position, prepped and draped in the usual sterile fashion. Antibiotics consisting of cefazolin 1g were administered. A final time out was performed, confirming the correct patient, patient information, procedure, imaging, and equipment.     We palpated the pulse generator over the left gluteal area and infiltrated the skin and subcutaneous tissues with 1% lidocaine with epinephrine. A 4 cm incision was made at the prior scar down to the capsule overlying the device. The capsule was incised and the device delivered. The lead screw was unscrewed and the battery/pulse generator passed off the field. A counter incision was made over the sacrum at the site of prior  insertion after infiltration with local anesthetic and the lead was tunneled back through this incision carefully. We then advanced the lead locking device through the lead, securing it, and using gentle traction to withdraw it through the incision. All four device electrodes were intact, as were the device tip and tines.    The capsule was incised in multiple places to ensure no fluid collection would develop. The subcutaneous tissue was approximated with 3-0 vicryl interrupted. The skin on both incision was closed with 4-0 monocryl subcuticular. Skin glue was applied.    The attending surgeon was present and scrubbed for the entirety of the case.    Procedure(s):  INSERTION, NEUROSTIMULATOR, SACRAL REMOVAL - Wound Class: Class I (Clean) - Incision Closure: Superficial Layers Only    SPECIMENS:  ID Type Source Tests Collected by Time Destination   A : EXPLANTED HARDWARES - BACK FOR GROSS ONLY Other Hardware Back PATHOLOGY TISSUE EXAM Albo, Michael Edward, MD 04/10/2018 0755        IMPLANTS:  None    Fluids/Blood Products:      IV Fluids: 300 ml crystalloid    Blood Products: none    EBL: 1 ml    Urine Output: unknown    COMPLICATIONS: None    DISPOSITION:  Patient emerged from sedation uneventfully and brought to PACU in stable condition  Discharge to home

## 2018-04-10 NOTE — Anesthesia Postprocedure Evaluation (Signed)
Anesthesia Transfer of Care Note    Patient: Stephen [name]    Procedures performed: Procedure(s):  INSERTION, NEUROSTIMULATOR, SACRAL REMOVAL    Vital signs: stable           Anesthesia Post Note    Patient: Stephen [name]    Procedure(s) Performed: Procedure(s):  INSERTION, NEUROSTIMULATOR, SACRAL REMOVAL      Final anesthesia type: Monitored Anesthesia Care    Patient location: PACU    Post anesthesia pain: adequate analgesia    Mental status: awake, alert  and oriented    Airway Patent: Yes    Last Vitals:   Vitals:    04/10/18 0915   BP: 106/62   Pulse: 56   Resp: 14   Temp:    SpO2: 98%       Post vital signs: stable    Hydration: adequate    N/V:no    Anesthetic complications: no    Plan of care per primary team.

## 2018-04-10 NOTE — Plan of Care (Signed)
Problem: Promotion of Perioperative Health and Safety  Goal: Promotion of Health and Safety of the Perioperative Patient  The patient remains safe, receives treatment appropriate to the surgical intervention and patient's physiological needs and is discharged or transferred to the appropriate level of care.    Information below is the current care plan.  Outcome: Progressing   04/10/18 0802   Patient /Family stated Goal   Patient /Family stated Goal tolerable pain level   Perioperative Plan of Care   Guidelines PACU   Individualized Interventions/Recommendations #1 provide restful environment   Individualized Interventions/Recommendations #2 (if applicable) encourage relaxation techniques

## 2018-04-10 NOTE — Anesthesia Preprocedure Evaluation (Addendum)
ANESTHESIA PRE-OPERATIVE EVALUATION    Patient Information    Name: Stephen [name]    MRN: [id number]    DOB: 05/16/1951    Age: 67 year old    Sex: male  Procedure(s):  INSERTION, NEUROSTIMULATOR, SACRAL REMOVAL (N/A )      Pre-op Vitals:   There were no vitals taken for this visit.        Primary language spoken:  English    ROS/Medical History:           General:  positive for Obesity,   Cardiovascular:  hypertension,     Anesthesia History:  negative anesthesia history ROS   Pulmonary:   negative pulmonary ROS     Neuro/Psych:   negative neuro/psych ROS   Hematology/Oncology:   hematologic/lymphatic negative      GI/Hepatic:  negative GI/hepatic ROS Infectious Disease:  negative for infectious disease     Renal:  negative renal ROS  Bladder neck contracture, urethral stricture Endocrine/Other:  arthritis,      Pregnancy History:   Pediatrics:    negative pediatric ROS       Pre Anesthesia Testing (PCC/CPC) notes/comments:                 Physical Exam    Airway:  Inter-inciser distance > 4 cm  Prognanth Able    Mallampati: II  Neck ROM: full  TM distance: > 6 cm  Short thick neck: No        Cardiovascular:  - cardiovascular exam normal         Pulmonary:  - pulmonary exam normal           Neuro/Neck/Skeletal/Skin:  - [place] ANE PHYS EXAM NEGATIVE ROS SKIN SKELETAL NEURO NECK          Dental:      Abdominal:      General: obesity   Abdomen: soft.     Additional Clinical Notes:               Last  OSA (STOP BANG) Score:  No Data Recorded    Last OSA  (STOP) Score for   No Data Recorded                 Past Medical History:   Diagnosis Date    Chronic suprapubic catheter (CMS-HCC)     Congenital hydronephrosis     Gout     Headache     Hematuria     Kidney disease     Kidney stones     Major depressive disorder, single episode     Polyarthropathy or polyarthritis of multiple sites     Urethral stricture      Past Surgical History:   Procedure Laterality Date    APPENDECTOMY      CT INSERTION OF SUPRAPUBIC CATH   09/25/2015    CYSTOSCOPY      CYSTOSCOPY W/ LASER LITHOTRIPSY      NEPHRECTOMY Right 1995    OTHER SURGICAL HISTORY      Interstim 01/29/2011     Social History     Tobacco Use    Smoking status: Never Smoker    Smokeless tobacco: Never Used   Substance Use Topics    Alcohol use: Not on file    Drug use: Not on file       No current facility-administered medications for this visit.      No current outpatient medications on file.     Facility-Administered Medications  Ordered in Other Visits   Medication Dose Route Frequency Provider Last Rate Last Dose    lactated ringers infusion   IntraVENOUS Continuous Steva Ready, MD        lidocaine, PF 1% injection 0.1 mL  0.1 mL IntraDERMAL Once PRN Steva Ready, MD         Allergies   Allergen Reactions    Sulfa Drugs Unspecified       Labs and Other Data  Lab Results   Component Value Date    NA 136 03/03/2018    K 3.7 03/03/2018    CL 100 03/03/2018    BICARB 23 03/03/2018    BUN 24 (H) 03/03/2018    CREAT 1.63 (H) 03/03/2018    GLU 113 (H) 03/03/2018    [place] 8.9 03/03/2018     Lab Results   Component Value Date    AST 27 03/03/2018    ALT 14 03/03/2018    ALK 74 03/03/2018    TP 7.7 03/03/2018    ALB 4.7 03/03/2018    TBILI 1.08 03/03/2018     Lab Results   Component Value Date    WBC 7.3 03/03/2018    RBC 4.15 (L) 03/03/2018    HGB 12.9 (L) 03/03/2018    HCT 38.5 (L) 03/03/2018    MCV 92.8 03/03/2018    MCHC 33.5 03/03/2018    RDW 13.8 03/03/2018    PLT 142 03/03/2018    MPV 10.2 03/03/2018    SEG 65 03/03/2018    LYMPHS 16 03/03/2018    MONOS 11 03/03/2018    EOS 0 03/03/2018    BASOS 0 03/03/2018     No results found for: INR, PTT  No results found for: ARTPH, ARTPO2, ARTPCO2    Anesthesia Plan:  Risks and Benefits of Anesthesia  I personally examined the patient immediately prior to the anesthetic and reviewed the pertinent medical history, drug and allergy history, laboratory and imaging studies and consultations. I have determined that the  patient has had adequate assessment and testing.    Anesthetic techniques, invasive monitors, anesthetic drugs for induction, maintenance and post-operative analgesia, risks and alternatives have been explained to the patient and/or patient's representatives.    I have prescribed the anesthetic plan:         Planned anesthesia method: Monitored Anesthesia Care         ASA 2 (Mild systemic disease)     Potential anesthesia problems identified and risks including but not limited to the following were discussed with patient and/or patient's representative: Adverse or allergic drug reaction, Administration of blood products, Recall, Ocular injury, Dental injury or sore throat, Nerve injury, Injury to brain, heart and other organs and Death    Planned monitoring method: Routine monitoring    Informed Consent:  Anesthetic plan and risks discussed with Patient.    Plan discussed with Surgeon.

## 2018-04-10 NOTE — Discharge Instructions (Addendum)
Urology Discharge Instructions    You may experience any of the following after an operation:   Soreness from urethra.   A sore throat if you were put to sleep and a breathing tube was placed in your throat.   Sleepiness from anesthesia or other drugs given to you prior to or during your surgery.   Fatigue just from having surgery.   Nausea occurs sometimes, but depends largely on your reaction to surgery and drugs.  These discomforts should improve rapidly and are usually gone the day following surgery.    You may speed your own recovery by the following precautions and self care:    1. Dressing and/or incision care:  n/a    2. Diet:   Progress to your normal diet as tolerated.     3. Activity:  Avoid driving or operating machinery while you require pain medication    4. Bathing:  You may take a shower today.     5. Medications:  Your physician will advise you on what medications to take for discomfort, and provide prescriptions as needed., Take only the medications your physician has prescribed. and Avoid aspirin for pain.    6. Return Appointment:  Follow up in 2 weeks for wound check    7. Other Instructions:  See below.    Call your doctor or come to the Emergency Department at [facility] if you have:   severe chills or fever   excessive bleeding from the site of your surgery, or if your dressing becomes soaked with blood   excessive pain, swelling, or odor at the site of your surgery   fainting, trouble breathing, or persistent dizziness   unable to urinate      Urology Contact Information:     If you have any questions about your hospital care, your medications, or if you have new or concerning symptoms soon after going home, and you need to contact your hospital physician, your hospital physician can be contacted in the following manner:      Dr. Jaci Carrel RN is Tera Mater [phone]). His surgery scheduler is Salvadore Farber [phone]).    Business Hours (Monday - Friday;  8:00 AM - 4:30 PM; Non-Urgent):  [facility] Office phone at [phone]  [facility] Office phone at [phone]    After-Hours, Weekends/Holidays:  FOR EMERGENCY ISSUES ONLY, call [phone] and ask them to page the on-call urologist. If it is not an emergency, then wait for business hours and call the number above.    If your clinic appointment has not been scheduled before you leave the hospital and you do not receive a call within 2 business days, call the clinic scheduler for your urologist's clinic to schedule the appointment.

## 2018-04-11 ENCOUNTER — Ambulatory Visit: Payer: BLUE CROSS/BLUE SHIELD | Admitting: Cardiovascular Disease

## 2018-04-11 ENCOUNTER — Telehealth: Payer: Self-pay

## 2018-04-11 ENCOUNTER — Encounter: Payer: Self-pay | Admitting: Cardiovascular Disease

## 2018-04-11 ENCOUNTER — Encounter: Payer: BLUE CROSS/BLUE SHIELD | Admitting: Cardiovascular Disease

## 2018-04-11 ENCOUNTER — Telehealth (HOSPITAL_BASED_OUTPATIENT_CLINIC_OR_DEPARTMENT_OTHER): Payer: Self-pay | Admitting: Anesthesiology

## 2018-04-11 ENCOUNTER — Encounter (HOSPITAL_BASED_OUTPATIENT_CLINIC_OR_DEPARTMENT_OTHER): Payer: Self-pay | Admitting: Pain Medicine

## 2018-04-11 DIAGNOSIS — F40240 Claustrophobia: Secondary | ICD-10-CM

## 2018-04-11 MED ORDER — DIAZEPAM 5 MG OR TABS
5.0000 mg | ORAL_TABLET | Freq: Four times a day (QID) | ORAL | 0 refills | Status: AC | PRN
Start: 2018-04-11 — End: 2018-04-12

## 2018-04-11 NOTE — Telephone Encounter (Signed)
Lmov for patient need to change time of appointment   Will try again at a later time

## 2018-04-11 NOTE — Telephone Encounter (Signed)
Pt called to request medication for MRI, to help sedate him before and during his MRI. Appointment for MRI date is 05/01/18. Please advise, thank you.

## 2018-04-13 NOTE — Telephone Encounter (Signed)
Done

## 2018-04-18 HISTORY — PX: CARDIOVASCULAR STRESS TEST: SHX262

## 2018-04-20 NOTE — Progress Notes (Signed)
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    No pain or discharge from incision.      GU History  2007: Lithotripsy after which he developed clot retention  2010: TURP   Developed [place] was told he needed to catheterize  Recurrent [place]  2013 - Interstim placed; doesn't recall if he was able to urinate after, thinks he was still catheterizing.    Recurrent [place]  2016 - SPT placed     VUDS 04/20/2017  Impression:  Normal storage phase  Unable to assess voiding phase, no voluntary detrusor contraction noted.   No evidence of anterior urethral stricture on retrograde imaging    12/07/2017: DVIU with [facility] by Dr. Lucia Bitter     01/31/2018 Cystoscopy   Findings:  Meatus: normal  Anterior urethra: normal  Membranous urethra: normal  Prostatic Urethra:TURP defect 22fr opening into the bladder    Bladder: normal mucosa, no masses or foreign bodies identified    01/31/18   SPT Capped and has been voiding per urethra since    04/10/18 Insterstim Removal       [facility] IPSS REVIEW FS 04/26/2018   Incomplete Emptying 0   Frequency 0   Intermittencey 0   Urgency 0   Weak Stream 0   Straining 1   Nocturia 0   IPSS Total Score 1   Quality of Life 2       Allergies   Allergen Reactions    Sulfa Drugs Unspecified       A comprehensive PFSH performed during a previous encounter in the office was re-examined and reviewed with the patient. There is nothing new to add today.   For details, please refer to my previous note in this chart, dated  02/01/18 .    Review of Systems  Review of Systems  Constitutional: Negative  Eyes: Glasses/Contacts  ENT: Negative  Cardiac: Negative  Pulmonary: Negative  Gastrointestional: Negative  Musculoskeletal: Pain in joints;Lower back pain;Muscle weakness  Skin:  Negative  Neurologic: Negative  Psychiatric: Difficulty Sleeping  Endocrine: Dry skin  Blood Disease: Negative  Allergy: Negative    Objective:  Vitals: BP 118/70 (BP Location: Left arm, BP Patient Position: Sitting, BP cuff size: Regular)    Pulse (!) 47    Temp 97.8 F (36.6 C) (Oral)    Resp 17    Ht 5\' 8"  (1.727 m)    Wt 89.4 kg (197 lb)    SpO2 98%    BMI 29.95 kg/m   Neuro/psych:  Alert and oriented x 3 in a pleasant mood  Skin: Warm, dry and intact    Incision looks good.      Assessment:    ICD-10-CM ICD-9-CM   1. Other mechanical complication of implanted electronic neurostimulator, generator, subsequent encounter T85.193D 996.2   2. Urinary retention R33.9 788.20       Plan:  Based on my review of his symptoms, findings on physical exam and diagnostic studies performed the patient will follow up with Dr. Lucia Bitter.

## 2018-04-23 ENCOUNTER — Other Ambulatory Visit: Payer: Self-pay

## 2018-04-26 ENCOUNTER — Encounter (HOSPITAL_BASED_OUTPATIENT_CLINIC_OR_DEPARTMENT_OTHER): Payer: Self-pay | Admitting: Urology

## 2018-04-26 ENCOUNTER — Ambulatory Visit: Payer: Medicaid Other | Admitting: Urology

## 2018-04-26 VITALS — BP 118/70 | HR 47 | Temp 97.8°F | Resp 17 | Ht 68.0 in | Wt 197.0 lb

## 2018-04-26 DIAGNOSIS — T85193D Other mechanical complication of implanted electronic neurostimulator, generator, subsequent encounter: Principal | ICD-10-CM

## 2018-04-26 DIAGNOSIS — R339 Retention of urine, unspecified: Secondary | ICD-10-CM

## 2018-04-26 NOTE — Interdisciplinary (Signed)
Patient was seen in the clinic today by Dr. Neita Goodnight. AVS instructions were provided.    Plan:   Continue catheter changes until appt with Dr. Lucia Bitter    Patient agreed and verbalized understanding.

## 2018-04-26 NOTE — Patient Instructions (Addendum)
Continue catheter changes monthly until follow up with Dr. Meribeth Mattes E. Neita Goodnight, MD  Co-Director, [facility], Department of Urology   Professor of Urology    Zane Herald, MD  Assistant Professor of Urology    Sarita Bottom, [place]    Leslie Andrea, RN  Clinic Number [phone]  [address], [address]  [place], [place] [address]     Appointment Line #: [phone]     [facility]  Office number # [phone]  Fax number # [phone]     Clinic Hours: 8-4:30pm     AFTER HOURS EMERGENCY NUMBER # [phone]  (Ask for the Urologist On-Call)

## 2018-04-27 ENCOUNTER — Encounter: Payer: Self-pay | Admitting: Cardiovascular Disease

## 2018-04-27 ENCOUNTER — Ambulatory Visit (INDEPENDENT_AMBULATORY_CARE_PROVIDER_SITE_OTHER): Payer: BLUE CROSS/BLUE SHIELD | Admitting: Cardiovascular Disease

## 2018-04-27 VITALS — BP 134/60 | HR 58 | Ht 68.0 in | Wt 238.5 lb

## 2018-04-27 DIAGNOSIS — Z720 Tobacco use: Secondary | ICD-10-CM | POA: Diagnosis not present

## 2018-04-27 DIAGNOSIS — R0602 Shortness of breath: Secondary | ICD-10-CM

## 2018-04-27 DIAGNOSIS — E785 Hyperlipidemia, unspecified: Secondary | ICD-10-CM | POA: Diagnosis not present

## 2018-04-27 DIAGNOSIS — I351 Nonrheumatic aortic (valve) insufficiency: Secondary | ICD-10-CM

## 2018-04-27 NOTE — Progress Notes (Signed)
Cardiology Office Note   Date:  04/27/2018   ID:  Johnny Holland, DOB Apr 27, 1951, MRN [id number]  PCP:  Ria Bush, MD  Cardiologist:   Kathlyn Sacramento, MD   Chief Complaint  Patient presents with  . other    Ref by Dr. Danise Mina for aortic valve disease. Meds reviewed by the pt. verbally. Pt. c/o LE edema, fatigue Johnny shortness of breath.       History of Present Illness:Johnny Holland is a 67 y.o. male who was referred by Dr. Danise Mina for evaluation of aortic valve disease Johnny coronary artery disease noticed on CT scan in 2018. He has chronic medical conditions that include COPD, hyperlipidemia, obesity tobacco use Johnny erectile dysfunction. He reports progressive exertional dyspnea over the years which he thinks is due to his lung disease Johnny physical deconditioning.  He does not exercise on a regular basis.  He denies any chest pain except for one isolated episode recently when he was at the beach.  No orthopnea, PND or leg edema.  No leg claudication.  He has prolonged history of tobacco use but currently uses E cigarettes with nicotine.  He has not been able to quit.  He reports family history of congestive heart failure as both his father Johnny grandfather died of congestive heart failure.  They were alcoholics Johnny heavy smokers.  Echocardiogram in April 2019 showed normal LV systolic function, grade 2 diastolic dysfunction, mildly calcified aortic valve without significant stenosis, mild to moderate aortic regurgitation Johnny no evidence of pulmonary hypertension.  Previous CT scan showed LAD Johnny left circumflex calcifications.   Past Medical History:  Diagnosis Date  . Aortic regurgitation 10/15/2012   Echocardiogram showing calcified aortic valves, mild AS, mod AR, preserved EF 55-65%, mild LV dilation Johnny LVH (02/2017). rec rpt 1 yr.   [place] CAD (coronary artery disease) 01/2017   4v by CT  . Carotid stenosis 01/12/2016   Minimal on [name] (01/2016) f/u PRN   . COPD  (chronic obstructive pulmonary disease) ([place]) 01/2017   mild paraseptal Johnny centrilobular by CT  . Diverticulitis   . Diverticulosis of colon   . Duodenal ulcer   . Hepatic steatosis 01/2017   by CT  . History of smoking   . HLD (hyperlipidemia)   . Right shoulder pain    impingement with partial RTC tear ([place])  . Thoracic aorta atherosclerosis ([place]) 01/2017   by CT    Past Surgical History:  Procedure Laterality Date  . COLONOSCOPY  11/30/06   diverticulosis; no polyps  . INCISE Johnny DRAIN ABCESS     Hemmorhoid  . VASECTOMY     x 2     Current Outpatient Medications  Medication Sig Dispense Refill  . aspirin 81 MG tablet Take 1 tablet (81 mg total) by mouth daily.    . Multiple Vitamin (MULTI-VITAMIN DAILY PO) Take by mouth.    . pravastatin (PRAVACHOL) 80 MG tablet Take 1 tablet (80 mg total) by mouth at bedtime. 90 tablet 3  . scopolamine (TRANSDERM-SCOP, 1.5 MG,) 1 MG/3DAYS Place 1 patch (1.5 mg total) onto the skin every 3 (three) days. 4 patch 1  . sildenafil (VIAGRA) 100 MG tablet TAKE 0.5-1 TABLETS (50-100 MG TOTAL) BY MOUTH DAILY AS NEEDED FOR ERECTILE DYSFUNCTION. 5 tablet 3   No current facility-administered medications for this visit.     Allergies:   Erythromycin    Social History:  The patient  reports that he quit smoking about 6  years ago. He has a 42.00 pack-year smoking history. He has never used smokeless tobacco. He reports that he drinks alcohol. He reports that he does not use drugs.   Family History:  The patient's family history includes Alcohol abuse in his father Johnny sister; Alzheimer's disease in his unknown relative; Breast cancer in his maternal aunt; COPD in his mother; Cataracts in his mother; Cirrhosis in his father; Depression in his sister; Diabetes in his paternal grandmother; Emphysema in his father; Heart attack (age of onset: 10) in his father; Heart failure in his father; Macular degeneration in his mother.    ROS:  Please see the  history of present illness.   Otherwise, review of systems are positive for none.   All other systems are reviewed Johnny negative.    PHYSICAL EXAM: VS:  BP 134/60 (BP Location: Right Arm, Patient Position: Sitting, Cuff Size: Normal)   Pulse (!) 58   Ht 5\' 8"  (1.727 m)   Wt 238 lb 8 oz (108.2 kg)   BMI 36.26 kg/m  , BMI Body mass index is 36.26 kg/m. GEN: Well nourished, well developed, in no acute distress  HEENT: normal  Neck: no JVD, carotid bruits, or masses Cardiac: RRR; no  rubs, or gallops,no edema .  2 out of 6 systolic ejection murmur in the aortic area which is early peaking with radiation to the carotid arteries Respiratory:  clear to auscultation bilaterally, normal work of breathing GI: soft, nontender, nondistended, + BS MS: no deformity or atrophy  Skin: warm Johnny dry, no rash Neuro:  Strength Johnny sensation are intact Psych: euthymic mood, full affect Vascular: Radial pulses slightly diminished on the right side Johnny normal on the left.  Distal pulses are normal.  EKG:  EKG is ordered today. The ekg ordered today demonstrates sinus bradycardia with left axis deviation Johnny possible old septal infarct.   Recent Labs: 02/21/2018: ALT 18; BUN 16; Creatinine, Ser 1.07; Potassium 4.3; Sodium 137    Lipid Panel    Component Value Date/Time   CHOL 166 02/21/2018 0840   TRIG 149.0 02/21/2018 0840   HDL 56.10 02/21/2018 0840   CHOLHDL 3 02/21/2018 0840   VLDL 29.8 02/21/2018 0840   LDLCALC 80 02/21/2018 0840   LDLDIRECT 130.1 10/10/2011 0938      Wt Readings from Last 3 Encounters:  04/27/18 238 lb 8 oz (108.2 kg)  02/27/18 237 lb (107.5 kg)  02/21/18 230 lb (104.3 kg)       PAD Screen 04/27/2018  Previous PAD dx? No  Previous surgical procedure? No  Pain with walking? No  Feet/toe relief with dangling? No  Painful, non-healing ulcers? No  Extremities discolored? No      ASSESSMENT Johnny PLAN:  1.  Exertional dyspnea: Certainly this could be due to  physical deconditioning Johnny lung disease related to prolonged tobacco use.  However, he has multiple risk factors for coronary artery disease Johnny thus I recommend evaluation with a pharmacologic nuclear stress test.  The patient had a previous treadmill stress test Johnny was not able to get his heart rate up to 85% maximal predicted heart rate. I discussed with him the importance of controlling his risk factors.  2.  Aortic valve disease: The patient has aortic murmur consistent with mild aortic stenosis.  There was mild to moderate aortic insufficiency.  Continue to monitor this clinically with repeat echocardiogram in 1 to 2 years.  3.  Hyperlipidemia: Currently on pravastatin with most recent LDL of 80.  4.  Tobacco use: I discussed with him the importance of smoking cessation.    Disposition:   FU with me in 1 year  Signed,  Kathlyn Sacramento, MD  04/27/2018 1:47 PM    [place]

## 2018-04-27 NOTE — Patient Instructions (Addendum)
Medication Instructions:  Your physician recommends that you continue on your current medications as directed. Please refer to the Current Medication list given to you today.   Labwork: None ordered  Testing/Procedures: Your physician has requested that you have a lexiscan myoview. For further information please visit [email or web address]. Please follow instruction sheet, as given.   Follow-Up: Your physician recommends that you schedule a follow-up appointment in: 1 year with Dr.Arida   Any Other Special Instructions Will Be Listed Below (If Applicable).     If you need a refill on your cardiac medications before your next appointment, please  call your pharmacy.   [place]  Your caregiver has ordered a Stress Test with nuclear imaging. The purpose of this test is to evaluate the blood supply to your heart muscle. This procedure is referred to as a "Non-Invasive Stress Test." This is because other than having an IV started in your vein, nothing is inserted or "invades" your body. Cardiac stress tests are done to find areas of poor blood flow to the heart by determining the extent of coronary artery disease (CAD). Some patients exercise on a treadmill, which naturally increases the blood flow to your heart, while others who are  unable to walk on a treadmill due to physical limitations have a pharmacologic/chemical stress agent called Lexiscan . This medicine will mimic walking on a treadmill by temporarily increasing your coronary blood flow.   Please note: these test may take anywhere between 2-4 hours to complete  PLEASE REPORT TO [facility] AT THE FIRST DESK WILL DIRECT YOU WHERE TO GO  Date of Procedure:_____________________________________  Arrival Time for Procedure:______________________________  Instructions regarding medication:    N/A_:  Hold other medications as  follows:_________________________________________________________________________________________________________________________________________________________________________________________________________________________________________________________________________________________  PLEASE NOTIFY THE OFFICE AT LEAST 24 HOURS IN ADVANCE IF YOU ARE UNABLE TO KEEP YOUR APPOINTMENT.  [phone] AND  PLEASE NOTIFY NUCLEAR MEDICINE AT [facility] AT LEAST 24 HOURS IN ADVANCE IF YOU ARE UNABLE TO KEEP YOUR APPOINTMENT. [phone]  How to prepare for your Myoview test:  1. Do not eat or drink after midnight 2. No caffeine for 24 hours prior to test 3. No smoking 24 hours prior to test. 4. Your medication may be taken with water.  If your doctor stopped a medication because of this test, do not take that medication. 5. Ladies, please do not wear dresses.  Skirts or pants are appropriate. Please wear a short sleeve shirt. 6. No perfume, cologne or lotion. 7. Wear comfortable walking shoes. No heels!

## 2018-05-01 ENCOUNTER — Ambulatory Visit
Admission: RE | Admit: 2018-05-01 | Discharge: 2018-05-01 | Disposition: A | Discharge status: Home / Self Care | Payer: Medicaid Other | Attending: Radiology | Admitting: Radiology

## 2018-05-01 DIAGNOSIS — M9961 Osseous and subluxation stenosis of intervertebral foramina of cervical region: Secondary | ICD-10-CM | POA: Insufficient documentation

## 2018-05-01 DIAGNOSIS — M5412 Radiculopathy, cervical region: Secondary | ICD-10-CM

## 2018-05-01 DIAGNOSIS — M4722 Other spondylosis with radiculopathy, cervical region: Secondary | ICD-10-CM | POA: Insufficient documentation

## 2018-05-01 DIAGNOSIS — M4802 Spinal stenosis, cervical region: Secondary | ICD-10-CM | POA: Insufficient documentation

## 2018-05-01 DIAGNOSIS — M8938 Hypertrophy of bone, other site: Secondary | ICD-10-CM | POA: Insufficient documentation

## 2018-05-03 ENCOUNTER — Ambulatory Visit: Payer: Medicaid Other | Attending: Urology

## 2018-05-03 DIAGNOSIS — R339 Retention of urine, unspecified: Principal | ICD-10-CM | POA: Insufficient documentation

## 2018-05-03 NOTE — Interdisciplinary (Signed)
Catheter Change:     Patient presents to clinic for routine catheter change, per standing order from MD.     Patient ambulatory:  yes   Chaperone: No  Patient in acute distress: No  New symptoms/concerns:/No     Preparation:  Allergies verified with patient   Procedure explained  Patient verbalized understanding w/o objections  Patient positioned and prepped for procedure      Procedure:  16 Fr Suprapubic catheter discontinued with no difficulty after fully deflating cath balloon - 10 mls of water aspirated from cath balloon. Patient tolerated well with minimal discomfort. Under sterile technique patient prepped with betadine. 16 Fr Suprapubic catheter inserted into bladder with no difficulty. 10 mls of sterile water instilled within cath balloon. Bladder irrigated with 40 ml sterile water. Clear, yellow urine drained. Patient tolerated procedure well with no discomfort. Connected cap (bladder training per MD) Patient stable at discharge.

## 2018-05-04 ENCOUNTER — Ambulatory Visit
Admission: RE | Admit: 2018-05-04 | Discharge: 2018-05-04 | Disposition: A | Discharge status: Home / Self Care | Payer: BLUE CROSS/BLUE SHIELD | Source: Ambulatory Visit | Attending: Cardiovascular Disease | Admitting: Cardiovascular Disease

## 2018-05-04 DIAGNOSIS — R0602 Shortness of breath: Secondary | ICD-10-CM | POA: Insufficient documentation

## 2018-05-04 LAB — NM MYOCAR MULTI W/SPECT W/WALL MOTION / EF
LV dias vol: 16 mL (ref 62–150)
LV sys vol: 79 mL
SDS: 0
SRS: 0
SSS: 0
TID: 1.06

## 2018-05-04 MED ORDER — TECHNETIUM TC 99M TETROFOSMIN IV KIT
30.0000 | PACK | Freq: Once | INTRAVENOUS | Status: AC | PRN
  Administered 2018-05-04: 33.16 via INTRAVENOUS

## 2018-05-04 MED ORDER — TECHNETIUM TC 99M TETROFOSMIN IV KIT
10.0000 | PACK | Freq: Once | INTRAVENOUS | Status: AC | PRN
  Administered 2018-05-04: 13.57 via INTRAVENOUS

## 2018-05-04 MED ORDER — REGADENOSON 0.4 MG/5ML IV SOLN
0.4000 mg | Freq: Once | INTRAVENOUS | Status: AC
  Administered 2018-05-04: 0.4 mg via INTRAVENOUS

## 2018-05-17 ENCOUNTER — Telehealth (HOSPITAL_BASED_OUTPATIENT_CLINIC_OR_DEPARTMENT_OTHER): Payer: Self-pay | Admitting: Anesthesiology

## 2018-05-17 NOTE — Telephone Encounter (Signed)
Will have our staff contact the patient tomorrow to inform him he should reach out to Dr. [name] via [place] in order to discuss the MRI results and the plan going forward.

## 2018-05-17 NOTE — Telephone Encounter (Signed)
Pt is requesting a call back on cervical spine MRI completed on 05/01/18, please advise.

## 2018-05-18 NOTE — Telephone Encounter (Signed)
Called patient. No answer. Left voicemail telling him that he should make a clinic appointment to discuss MRI and what will be the plan going forward based on the results.

## 2018-05-18 NOTE — Telephone Encounter (Signed)
Patient is requesting a call back regarding previous message to discuss MRI results because he says he does not know how to use MyChart. Please call back at [phone], please advise.

## 2018-05-21 ENCOUNTER — Other Ambulatory Visit: Payer: Self-pay

## 2018-05-24 ENCOUNTER — Encounter (HOSPITAL_BASED_OUTPATIENT_CLINIC_OR_DEPARTMENT_OTHER): Payer: Self-pay | Admitting: Anesthesiology

## 2018-05-24 ENCOUNTER — Ambulatory Visit: Payer: Medicaid Other | Attending: Anesthesiology | Admitting: Anesthesiology

## 2018-05-24 ENCOUNTER — Ambulatory Visit (HOSPITAL_BASED_OUTPATIENT_CLINIC_OR_DEPARTMENT_OTHER): Payer: Medicaid Other | Admitting: Anesthesiology

## 2018-05-24 VITALS — BP 125/59 | HR 50 | Temp 98.6°F

## 2018-05-24 DIAGNOSIS — M47817 Spondylosis without myelopathy or radiculopathy, lumbosacral region: Principal | ICD-10-CM | POA: Insufficient documentation

## 2018-05-24 NOTE — Patient Instructions (Signed)
Please allow up to 14 business days for the authorization to be processed and you will be contacted to schedule your procedure once it has been approved.    Please call [phone] To Check on Status of Authorization    Please call [phone] option #0 To Schedule Your Procedure once it's appoved    Follow Up Appointment  After procedure     If you have any questions please don't hesitate to call our clinic at [phone].    What are Facet Joints?  The facet joints are small paired joints that are located in the back of the spinal column.     The facet joints help to facilitate motions of the spine, such as bending and twisting. Like other joints in the body, the facet joints contain cartilage and synovial fluid that allow the surfaces to slide easily against each other. Each facet joint is surrounded by a fibrous capsule.    These joints are present throughout the spine, including the neck, mid-back, and lower back.     Why Do Facet Joints Become Painful?  As with other joints in the body, the facet joints can wear out over time. The cartilage within the joint can degenerate, or the capsule of the joint can be injured, leading to inflammation within the joint. Motions such as back-bending and twisting tend to increase the strain on these joints and provoke facet joint pain.     Pain signals from the joints are carried along small nerves called the medial branches.     What is a Medial Branch Block?  It is a diagnostic procedure used to determine if the facet joint is the source of your pain.     As noted above, the medial branch nerves carry pain signals from facet joints. A medial branch block is an injection of anesthetic medication onto these nerves, stopping them from transmitting pain signals for several hours after the procedure.     It is very important to pay attention to how your pain responds in the hours following the block. You should fill out a pain diary for your physician, recording your  pain scores at set intervals after the procedure.     If your pain significantly improves in the period after your block, then we have confirmed that the facet joints are indeed causing your pain, and we should proceed with the longer-term treatment, known as a radiofrequency ablation.     On the other hand, if the block did not have any effect on your pain, this suggests there may be another cause of your pain.     How is Medial Branch Block Performed?   Cleaning solution will be applied to the affected area, and then local anesthetic will be injected to numb the skin.     Needles will then be directed to the affected medial branches. Low-dose X-ray (fluoroscopy) will be utilized during the procedure to give your physician a series of real-time images that will ensure accurate placement and avoidance of injury to the surrounding tissues. After confirming the appropriate needle position, a small amount of local anesthetic will be injected at each level.     Risks and Complications  Medial branch blocks are considered very safe in general.   However, as with any medical procedure, there are potential risks associated with the procedure. These include bleeding, infection, headache, and nerve damage. Through the use of image guidance and sterile technique, we will take every measure to minimize these   potential risks and maximize the therapeutic benefit.

## 2018-05-24 NOTE — Progress Notes (Signed)
PAIN CLINIC FOLLOW UP NOTE    Primary Care Physician [name]    SUBJECTIVE:  This is a 67 year old male.  The patient is here for a follow up for: neck pain and low back pain.  He also has other nonspecific complaints including leg stiffness when initiating ambulation and leg swelling.  He has had a cervical and lumbar ESI with minimal relief.  Had a cervical MRI.  Most pain is in low back.     05/04/2018 1:32 PM - Electronic Interface To Epic, Radiant Results     Narrative     EXAM DESCRIPTION:  MRI CERVICAL SPINE W/O CONTRAST    CLINICAL HISTORY:  Persistent neck pain with cervical radiculopathy. Pain questionnaire reports midline neck pain extending to the bilateral shoulders. Patient also describes midline lower back pain extending into the bilateral legs.    TECHNIQUE:  MRI of the cervical spine was performed without intravenous contrast on a 3 Tesla magnet with acquisition of the following sequences: Localizer, sagittal T1, T2 and STIR. Axial T2 and 2D merge.    COMPARISON:  None    FINDINGS:  There is straightening of the normal cervical lordosis. There is 2-3 mm of spondylolisthesis of C7 on T1. Otherwise, the cervical alignment is maintained. The craniocervical alignment is maintained.    There are fibrovascular (Modic type 1) changes in the C7-T1 endplates, eccentric to the right. There are mixed predominantly fatty rather than fibrovascular (Modic type 1 and 2) endplate changes in E2-A8, with the fibrovascular changes eccentric to the left. There are fatty (Modic type 2) changes within the C5-C6 endplates. There are fatty changes within the left C2 through C7 facets and fibrovascular changes within the right C2 through C7 facets. There are minimal surrounding periarticular inflammatory changes bilaterally.    No abnormal signal within the posterior fossa.    There is no abnormal cord signal confirmed within 2 planes.    There is no prevertebral soft tissue swelling. The visualized paraspinal soft  tissues are otherwise unremarkable. The flow voids in the neck are preserved.    There are multilevel degenerative disc changes with facet arthropathy, asymmetric to the left. The axial levels are subsequently described:    C2-C3: Small posterior disc osteophyte complex. Infolding of the ligamentum flavum. Minimal spinal canal narrowing. Bilateral uncovertebral hypertrophy. Moderate left and mild right facet arthropathy causes mild left greater than right neural foraminal narrowing.    C3-C4: Posterior disc osteophyte complex, eccentric to the left. Infolding of the ligamentum flavum. There is minimal spinal canal narrowing. Asymmetric left uncovertebral hypertrophy with severe left and moderate facet arthropathy causes severe left and mild right neural foraminal narrowing.    C4-C5: Posterior disc osteophyte complex, eccentric to the left. Infolding of ligamentum flavum. There is mild spinal canal narrowing. Asymmetric left uncovertebral hypertrophy with extremely severe left and moderate right facet arthropathy causes severe left and mild right neural foraminal narrowing.    C5-C6: Moderate disc height loss with a posterior disc osteophyte complex. Infolding of the ligamentum flavum. Collectively this causes moderately severe spinal canal stenosis with contact of the ventral and dorsal cord without frank cord compression. Bilateral uncovertebral hypertrophy with moderately severe left and moderate right facet arthropathy causes severe bilateral neural foraminal narrowing.    C6-C7: Advanced disc height loss with a posterior disc osteophyte complex and infolding of ligamentum flavum with contact of the ventral and dorsal cord reflecting moderately spinal canal stenosis without frank compression of the cord. Severe  left and moderately severe right facet arthropathy results in moderate to severe left (in combination with assessment of the sagittal views) and moderate right neural foraminal narrowing.    C7-T1:  Spondylolisthesis with uncovering of the disc. Infolding of ligamentum flavum. There is mild spinal canal narrowing. Severe bilateral facet arthropathy in conjunction with the spondylolisthesis causes moderately severe right and mild left neural foraminal narrowing.    T1-T2: Only assessed on the sagittal images. Disc height loss with a posterior disc osteophyte complex. Infolding of ligamentum flavum. There is mild spinal canal narrowing. Moderate right and mild left facet arthropathy. Moderate right and mild left neural foraminal narrowing.    T2-T3: Only assessed on the sagittal images. Posterior disc osteophyte complex with infolding of ligamentum flavum causes mild spinal canal narrowing. Severe right and mild left facet arthropathy causes severe right and mild left neural foraminal narrowing.         Current Pain Description  Patient stated their pain score today is 8/10 . Since the last visit in Pain Clinic patient reports 20% improvement in their pain from pain treatments.  Patient reports 20% improvement in their pain with use of their current pain medications. On the pain diagram patient shades in the neck, low back, bilateral LEs. They describe their pain as constant, radiating. Patient states their pain is associated with weakness, tightness and muscle spasms.     This pain has made it hard for the patient to sleep, sit and exercise. Patient's highest pain level in the last week is 8/10. Patient's lowest pain level in the past week is 8/10. Patient's average pain level in the last week is: 8/10.    Pain Treatments  Previous pain procedures and response include:   Cervical ESI 01/23/2018  Lumbar ESI 11/24/2018  Patient is currently using the following pain medications: meloxicam, tizanidine which he feels is not helping.  This was being prescribed by his last pain physician.  When asked who is prescribing this now, he repsponded that I was taking it over.    Patient's significant social history is:      Social History     Socioeconomic History    Marital status: Single     Spouse name: Not on file    Number of children: Not on file    Years of education: Not on file    Highest education level: Not on file   Occupational History    Not on file   Social Needs    Financial resource strain: Not on file    Food insecurity:     Worry: Not on file     Inability: Not on file    Transportation needs:     Medical: Not on file     Non-medical: Not on file   Tobacco Use    Smoking status: Never Smoker    Smokeless tobacco: Never Used   Substance and Sexual Activity    Alcohol use: Not on file    Drug use: Not on file    Sexual activity: Never   Lifestyle    Physical activity:     Days per week: Not on file     Minutes per session: Not on file    Stress: Not on file   Relationships    Social connections:     Talks on phone: Not on file     Gets together: Not on file     Attends religious service: Not on file     Active member  of club or organization: Not on file     Attends meetings of clubs or organizations: Not on file     Relationship status: Not on file    Intimate partner violence:     Fear of current or ex partner: Not on file     Emotionally abused: Not on file     Physically abused: Not on file     Forced sexual activity: Not on file   Other Topics Concern    Not on file   Social History Narrative    Not on file       Review of Systems   General: Poor sleep  Cardiovascular: Negative  Gastrointestinal: Negative  Genito/Reproductive: Negative  Endocrine: Negative  Psychiatry: Difficulty falling asleep  EENT: Negative  Respiratory: Negative  Urinary: Negative  Musculoskeletal: Joint pain/swelling and Muscle pain  Skin: Dryness/itching  Neurological: Negative    Past medical history is significant for:   has a past medical history of Chronic suprapubic catheter (CMS-HCC), Congenital hydronephrosis, Gout, Headache, Hematuria, Kidney disease, Kidney stones, Major depressive disorder, single episode,  Polyarthropathy or polyarthritis of multiple sites, and Urethral stricture.    Current medications include:  Current Outpatient Medications   Medication Sig Dispense Refill    allopurinol (ZYLOPRIM) 300 MG tablet Take 300 mg by mouth daily.      amLODIPINE (NORVASC) 5 MG tablet Take 10 mg by mouth daily.      atenolol (TENORMIN) 25 MG tablet Take 25 mg by mouth daily.      docusate sodium (COLACE) 250 MG capsule Take 1 capsule by mouth twice daily while taking narcotic and as needed for constipation. 60 capsule 0    HYDROcodone-acetaminophen (NORCO) 5-325 MG tablet TAKE ONE TABLET BY MOUTH EVERY SIX HOURS AS NEEDED FOR PAIN 12 each 0    ibuprofen (MOTRIN) 200 MG tablet Take 200 mg by mouth every 6 hours as needed for Mild Pain (Pain Score 1-3).      Ibuprofen-Diphenhydramine Cit (ADVIL PM PO) as needed (1 qhs).      meloxicam (MOBIC) 7.5 MG tablet        oxybutynin (DITROPAN) 5 MG tablet TAKE 1 TABLET BY MOUTH 3 TIMES DAILY AS NEEDED (BLADDER SPASM OR PAIN). 15 each 0    phenazopyridine (PYRIDIUM) 100 MG tablet TAKE 1 TABLET BY MOUTH 3 TIMES DAILY AS NEEDED FOR MILD PAIN 20 each 0    tizanidine (ZANAFLEX) 4 MG capsule Take 4 mg by mouth 3 times daily.      traMADol (ULTRAM) 50 MG tablet Take 1-2 tablets by mouth every 6 hours as needed for severe pain. 10 tablet 0     No current facility-administered medications for this visit.        Patient's current allergies are:  Sulfa drugs    OBJECTIVE:  Physical Exam  Vitals: BP 125/59 (BP Location: Right arm, BP Patient Position: Sitting, BP cuff size: Large)    Pulse 50    Temp 98.6 F (37 C) (Oral)   General:  Well-developed, well-nourished, cooperative, in no acute distress.  Mental Status:  Alert, oriented x3. Speech is clear and fluent.  Affect:  Euthymic.  Skin:  No rashes or bruises.  HEENT:  Pupils equal, not pinpoint.  Pulmonary:  Breathing easily without tachypnea or bradypnea.  Cardiac:  No LE edema.  Abdomen:  Not distended.  Ambulation: Pt is able  to raise from a seated position without difficulty. Gait is not antalgic and the patient ambulates without assistance.  Musculoskeletal: Neck: limited ROM due to pain.  Diffusely tender with no focal areas of pain.   Back: Diffuse pain to palpation.  R > L.  Pain with extension and rotation.  Patrick's and FABERS negative.  SLR negative   Upper Ext: NL   Lower Ext: NL  Neurosensory: Motor exam: intact. Sensory exam intact.    No diagnosis found.    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  He was difficult to follow and not quite sure what his goals are.  I also made it clear to him that I would not prescribe medications and this would need to come from his PCP.  He also need to follow up with PCP to evaluation the LE swelling he reports (although I could not detect any) and his LE stiffness.  Since he reports no benefit from the meloxicam and tizanidine, I recommend stopping.  He was asking about lyrica feel it is reasonable to try but would need to discuss with his PCP.    PLAN  Medication Plan: Trial of lyrica 75 qhs.  May titrate up to 150 mg BID if tolerated.  Could also consider duloxetine 60 mg/day.    Procedure Plan: He is interested in trying a lumbar medial branch block and if successful, radiofrequency lesioning.    Follow-up: after injection.

## 2018-06-04 ENCOUNTER — Other Ambulatory Visit: Payer: Self-pay

## 2018-06-07 ENCOUNTER — Ambulatory Visit: Payer: Medicaid Other | Attending: Urology

## 2018-06-07 DIAGNOSIS — R339 Retention of urine, unspecified: Principal | ICD-10-CM | POA: Insufficient documentation

## 2018-06-07 NOTE — Interdisciplinary (Signed)
Catheter Change:    Patient presents to clinic for routine catheterchange, per standing order from MD.    Patient ambulatory:  yes   Chaperone: No  Patient in acute distress: No  New symptoms/concerns:/No    Preparation:  Allergies verified with patient   Procedure explained  Patient verbalized understanding w/o objections  Patient positioned and prepped for procedure    Procedure:  16Fr Suprapubic catheter discontinued with no difficulty after fully deflating cath balloon - 17mls of water aspirated from cath balloon. Patient tolerated well with minimal discomfort. Under sterile technique patient prepped with betadine. 16Fr Suprapubic catheter inserted into bladder with no difficulty. 72mls of sterile water instilled within cath balloon. Bladder irrigated with 40 ml sterile water. Clear, yellow urine drained. Patient tolerated procedure well with no discomfort. Connected cap (bladder training per MD) Patient stable at discharge.

## 2018-06-11 ENCOUNTER — Telehealth (HOSPITAL_BASED_OUTPATIENT_CLINIC_OR_DEPARTMENT_OTHER): Payer: Self-pay | Admitting: Anesthesiology

## 2018-06-11 NOTE — Telephone Encounter (Signed)
Patient is calling regarding the facet injections ordered on 05/24/2018. Patient was made aware that the request is still being processed by our office. He is requesting a call back with an update on the status of the authorization at ph#[phone]. Per patient, it is OK to leave a message. Please advise.

## 2018-06-11 NOTE — Telephone Encounter (Signed)
facet injections auth submitted. Patient is aware.

## 2018-06-24 ENCOUNTER — Emergency Department
Admission: EM | Admit: 2018-06-24 | Discharge: 2018-06-24 | Disposition: A | Discharge status: Home / Self Care | Payer: Medicaid Other | Attending: Emergency Medicine | Admitting: Emergency Medicine

## 2018-06-24 ENCOUNTER — Encounter (HOSPITAL_COMMUNITY): Payer: Self-pay

## 2018-06-24 DIAGNOSIS — N289 Disorder of kidney and ureter, unspecified: Secondary | ICD-10-CM | POA: Insufficient documentation

## 2018-06-24 DIAGNOSIS — M549 Dorsalgia, unspecified: Secondary | ICD-10-CM | POA: Insufficient documentation

## 2018-06-24 DIAGNOSIS — R319 Hematuria, unspecified: Principal | ICD-10-CM | POA: Insufficient documentation

## 2018-06-24 DIAGNOSIS — Z79899 Other long term (current) drug therapy: Secondary | ICD-10-CM | POA: Insufficient documentation

## 2018-06-24 DIAGNOSIS — Z9359 Other cystostomy status: Secondary | ICD-10-CM | POA: Insufficient documentation

## 2018-06-24 DIAGNOSIS — Z466 Encounter for fitting and adjustment of urinary device: Secondary | ICD-10-CM | POA: Insufficient documentation

## 2018-06-24 DIAGNOSIS — M109 Gout, unspecified: Secondary | ICD-10-CM | POA: Insufficient documentation

## 2018-06-24 DIAGNOSIS — G8929 Other chronic pain: Secondary | ICD-10-CM | POA: Insufficient documentation

## 2018-06-24 DIAGNOSIS — Z882 Allergy status to sulfonamides status: Secondary | ICD-10-CM | POA: Insufficient documentation

## 2018-06-24 DIAGNOSIS — Z87798 Personal history of other (corrected) congenital malformations: Secondary | ICD-10-CM | POA: Insufficient documentation

## 2018-06-24 DIAGNOSIS — R339 Retention of urine, unspecified: Secondary | ICD-10-CM | POA: Insufficient documentation

## 2018-06-24 DIAGNOSIS — Z905 Acquired absence of kidney: Secondary | ICD-10-CM | POA: Insufficient documentation

## 2018-06-24 DIAGNOSIS — Z87442 Personal history of urinary calculi: Secondary | ICD-10-CM | POA: Insufficient documentation

## 2018-06-24 LAB — URINALYSIS WITH CULTURE REFLEX, WHEN INDICATED
Bilirubin: NEGATIVE
Leuk Esterase: NEGATIVE
Nitrite: NEGATIVE
RBC: >50 — AB (ref 0–?)
Specific Gravity: 1.017 (ref 1.002–1.030)
Urobilinogen: NEGATIVE
pH: 6 (ref 5.0–8.0)

## 2018-06-24 LAB — BASIC METABOLIC PANEL, BLOOD
Anion Gap: 13 mmol/L (ref 7–15)
BUN: 21 mg/dL (ref 8–23)
Bicarbonate: 24 mmol/L (ref 22–29)
Calcium: 9.3 mg/dL (ref 8.5–10.6)
Chloride: 105 mmol/L (ref 98–107)
Creatinine: 1.19 mg/dL — ABNORMAL HIGH (ref 0.67–1.17)
GFR: >60 mL/min
Glucose: 144 mg/dL — ABNORMAL HIGH (ref 70–99)
Potassium: 3.8 mmol/L (ref 3.5–5.1)
Sodium: 142 mmol/L (ref 136–145)

## 2018-06-24 LAB — CBC WITH DIFF, BLOOD
ANC-Automated: 4.1 10*3/uL (ref 1.6–7.0)
Abs Basophils: 0 10*3/uL (ref <0.1)
Abs Eosinophils: 0.3 10*3/uL (ref 0.1–0.5)
Abs Lymphs: 3.7 10*3/uL — ABNORMAL HIGH (ref 0.8–3.1)
Abs Monos: 0.7 10*3/uL (ref 0.2–0.8)
Basophils: 0 %
Eosinophils: 3 %
Hct: 43.7 % (ref 40.0–50.0)
Hgb: 14.5 gm/dL (ref 13.7–17.5)
Lymphocytes: 42 %
MCH: 30.4 pg (ref 26.0–32.0)
MCHC: 33.2 g/dL (ref 32.0–36.0)
MCV: 91.6 um3 (ref 79.0–95.0)
MPV: 10 fL (ref 9.4–12.4)
Monocytes: 8 %
Plt Count: 205 10*3/uL (ref 140–370)
RBC: 4.77 10*6/uL (ref 4.60–6.10)
RDW: 14.4 % — ABNORMAL HIGH (ref 12.0–14.0)
Segs: 46 %
WBC: 8.9 10*3/uL (ref 4.0–10.0)

## 2018-06-24 LAB — PROTHROMBIN TIME, BLOOD
INR: 1
PT,Patient: 11.2 s (ref 9.7–12.5)

## 2018-06-24 LAB — APTT, BLOOD: PTT: 32 s (ref 25–34)

## 2018-06-24 MED ORDER — PHENAZOPYRIDINE HCL 100 MG OR TABS
100.0000 mg | ORAL_TABLET | Freq: Three times a day (TID) | ORAL | 0 refills | Status: DC
Start: 2018-06-24 — End: 2019-08-23

## 2018-06-24 MED ORDER — LIDOCAINE HCL 2% EX GEL (UROJET)
10.0000 mL | Freq: Once | Status: DC
Start: 2018-06-24 — End: 2018-06-24
  Filled 2018-06-24: qty 10

## 2018-06-24 NOTE — Consults (Signed)
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 Patient normally voids per urethra, occasionally has to uncap SPT to drain bladder.    Patient states that he woke up this morning and had hematuria when he voided through his urethra. He was only able to void a small amount. He uncapped his SPT and nothing came out. He presented to the ED. In the ED, the SPT was again uncapped without return. He was able to void small amounts of dark wine-colored urine through his urethra. Bladder scan showed >999cc in bladder.     Patient denies trauma and is unsure what caused the hematuria. He has had hematuria in the past.     Past Medical and Surgical History:  Past Medical History:   Diagnosis Date    Chronic suprapubic catheter (CMS-HCC)     Congenital hydronephrosis     Gout     Headache     Hematuria     Kidney disease     Kidney stones     Major depressive disorder, single episode     Polyarthropathy or polyarthritis of multiple sites     Urethral stricture      Past Surgical History:   Procedure Laterality Date    APPENDECTOMY      CT INSERTION OF SUPRAPUBIC CATH  09/25/2015    CYSTOSCOPY      CYSTOSCOPY W/ LASER LITHOTRIPSY      NEPHRECTOMY Right 1995    OTHER SURGICAL HISTORY      Interstim 01/29/2011       Allergies:  Allergies   Allergen Reactions    Sulfa Drugs Unspecified       Medications:  No current facility-administered medications on file prior to encounter.      Current Outpatient Medications on File Prior to Encounter   Medication Sig Dispense Refill    allopurinol (ZYLOPRIM) 300 MG tablet Take 300 mg by mouth daily.      amLODIPINE (NORVASC) 5 MG tablet Take  10 mg by mouth daily.      atenolol (TENORMIN) 25 MG tablet Take 25 mg by mouth daily.      docusate sodium (COLACE) 250 MG capsule Take 1 capsule by mouth twice daily while taking narcotic and as needed for constipation. 60 capsule 0    ibuprofen (MOTRIN) 200 MG tablet Take 200 mg by mouth every 6 hours as needed for Mild Pain (Pain Score 1-3).      Ibuprofen-Diphenhydramine Cit (ADVIL PM PO) as needed (1 qhs).      meloxicam (MOBIC) 7.5 MG tablet        oxybutynin (DITROPAN) 5 MG tablet TAKE 1 TABLET BY MOUTH 3 TIMES DAILY AS NEEDED (BLADDER SPASM OR PAIN). 15 each 0    phenazopyridine (PYRIDIUM) 100 MG tablet TAKE 1 TABLET BY MOUTH 3 TIMES DAILY AS NEEDED FOR MILD PAIN 20 each 0    tizanidine (ZANAFLEX) 4 MG capsule Take 4 mg by mouth 3 times daily.      traMADol (ULTRAM) 50 MG tablet Take 1-2 tablets by mouth every 6 hours as needed for severe pain. 10 tablet 0  Current Facility-Administered Medications   Medication    lidocaine (UROJET) 2 % topical jelly 10 mL     Current Outpatient Medications   Medication Sig    allopurinol (ZYLOPRIM) 300 MG tablet Take 300 mg by mouth daily.    amLODIPINE (NORVASC) 5 MG tablet Take 10 mg by mouth daily.    atenolol (TENORMIN) 25 MG tablet Take 25 mg by mouth daily.    docusate sodium (COLACE) 250 MG capsule Take 1 capsule by mouth twice daily while taking narcotic and as needed for constipation.    ibuprofen (MOTRIN) 200 MG tablet Take 200 mg by mouth every 6 hours as needed for Mild Pain (Pain Score 1-3).    Ibuprofen-Diphenhydramine Cit (ADVIL PM PO) as needed (1 qhs).    meloxicam (MOBIC) 7.5 MG tablet      oxybutynin (DITROPAN) 5 MG tablet TAKE 1 TABLET BY MOUTH 3 TIMES DAILY AS NEEDED (BLADDER SPASM OR PAIN).    phenazopyridine (PYRIDIUM) 100 MG tablet TAKE 1 TABLET BY MOUTH 3 TIMES DAILY AS NEEDED FOR MILD PAIN    tizanidine (ZANAFLEX) 4 MG capsule Take 4 mg by mouth 3 times daily.    traMADol (ULTRAM) 50 MG tablet Take 1-2 tablets by mouth  every 6 hours as needed for severe pain.       Social History:  Social History     Socioeconomic History    Marital status: Single     Spouse name: Not on file    Number of children: Not on file    Years of education: Not on file    Highest education level: Not on file   Occupational History    Not on file   Tobacco Use    Smoking status: Never Smoker    Smokeless tobacco: Never Used   Substance and Sexual Activity    Alcohol use: Not Currently    Drug use: Not on file    Sexual activity: Never   Social Activities of Daily Living Present    Not on file   Social History Narrative    Not on file       Family History:  Family History   Adopted: Yes   Family history unknown: Yes       ROS:  Positive for +hematuria.  A complete review of systems was performed and was otherwise negative except as noted in HPI above.    ----------------------------------------------------------------------------------------------    Physical Exam:  BP  Min: 139/70  Max: 139/70  Temp  Min: 98 F (36.7 C)  Max: 98 F (36.7 C)  Pulse  Min: 69  Max: 69  Resp  Min: 16  Max: 16  SpO2  Min: 98 %  Max: 98 %  Weight  Min: 88.5 kg (195 lb)  Max: 88.5 kg (195 lb)    No intake/output data recorded.    GENERAL: Pleasant, cooperative and in no acute distress.   NEURO: Alert and Oriented x 3  HEENT: normalcephalic/atraumatic  NECK: midline trachea  PULM: non-labored breathing on RA  GI: soft, nondistended, mild suprapubic tenderness, 16Fr SPT in place, capped  BACK: No CVAT  GU: Normal uncircumcised phallus, bilateral descended anodular testes and normal cord structures.  SKIN: no obvious rashes or lesions  EXT: warm and well perfused.   PSYCH: mood appropriate      Labs:   Lab Results   Component Value Date    WBC 8.9 06/24/2018    RBC 4.77 06/24/2018    HGB 14.5 06/24/2018  HCT 43.7 06/24/2018    MCV 91.6 06/24/2018    MCHC 33.2 06/24/2018    RDW 14.4 (H) 06/24/2018    PLT 205 06/24/2018    MPV 10.0 06/24/2018     Lab Results      Component Value Date    NA 142 06/24/2018    K 3.8 06/24/2018    CL 105 06/24/2018    BICARB 24 06/24/2018    BUN 21 06/24/2018    CREAT 1.19 (H) 06/24/2018    GLU 144 (H) 06/24/2018    Badger 9.3 06/24/2018       Lab Results   Component Value Date    COLORUA Red (A) 06/24/2018    APPEARUA Bloody (A) 06/24/2018    GLUCOSEUA 1+ (A) 06/24/2018    BILIUA Negative 06/24/2018    KETONEUA 1+ (A) 06/24/2018    SGUA 1.017 06/24/2018    BLOODUA 2+ (A) 06/24/2018    PHUA 6.0 06/24/2018    PROTEINUA 2+ (A) 06/24/2018    UROBILUA Negative 06/24/2018    NITRITEUA Negative 06/24/2018    LEUKESTUA Negative 06/24/2018    WBCUA 0-2 06/24/2018    RBCUA >50 (A) 06/24/2018       Lab Results   Component Value Date    INR 1.0 06/24/2018    PTT 32 06/24/2018         No results found for: PSA    Microbiology:  none    Imaging:  none    ______________________________________________________________________    Assessment and Plan:  67 year old male with h/o solitary L kidney, TURP c/b recurrent BNC previously managed with [facility] and then SPT, now s/p DVIU with [facility] on 12/07/2017 primarily voids through urethra presents today with hematuria.     SPT uncapped at bedside with immediate return of 1100cc merlot colored urine. Attempted manual irrigation through existing 16Fr SPT with minimal success. Up-sized SPT to 18Fr and manually irrigated bladder with 1L sterile water. Irrigated many small clots until urine was crystal clear. Attached SPT to drainage and continued to drain clear after ~37min observation. Appears that all blood/clot was old and no active bleeding persists.     -please monitor for the next few hours to ensure urine does not darken and that there is any persistent bleeding  -continue SPT to drainage for 1 week to monitor for bleeding  -if hematuria has resolved, okay to cap SPT in 1 week and return to voiding per urethra  -patient scheduled to undergo cystoscopy with Dr. Lucia Bitter on 8/13   -does not need SPT change prior to this  appointment, as it was exchanged today in the ED    Staffed with attending, Dr. Edwyna Perfect. Discussed with chief resident Dr. Rita Ohara, MD  [facility], PGY-2

## 2018-06-24 NOTE — ED Provider Notes (Signed)
Emergency Department Provider Note    Stephen [name]  MRN: [id number]  DOB: April 05, 1951    The Date of Service for the Emergency Room encounter is 06/24/2018  6:26 AM     History obtained from patient    Chief Complaint:  Chief Complaint   Patient presents with    Hematuria     woke  up this am and had one episode of hematuria.   pt has suprapubic cath x years.       HPI:  Stephen [name] is a 67 year old male w/ hx of congenital hydronephrosis s/p R nephrectomy, chronic suprapubic catheter, intermittent hematuria, chronic back pain followed by pain management who presents with hematuria. Pt reports he awoke this morning, noticed dark red urine, given history of clotting came here for further eval. Pt urinates primarily via urethra but does have suprapubic catheter that he uses if he is unable to completely void. Pt has chronic back pain that is unchanged, followed by pain management and gets injections, none recently, has had bladder stimulator in the past which was recently removed, follow by urology and has an upcoming appt for cystoscopy per patient. Has a hx of TURP In the past, bladder neck narrowing that required intervention. Pt otherwise not on thinners, no dysuria, no frequency or urgency. nO abdominal pain, n/v.     Patient's medical history has been reviewed today as available in EPIC chart.    ROS:   Constitutional: No Fever  Eyes: No vision changes, No discharge  ENT:  No sore throat  Cardiovascular: No Chest pain  Respiratory: No cough, No SOB   GI: No abd pain, No nausea, No vomiting  GU:No dysuria, + hematuria  MSK: No joint pain   Skin: No Rash   Neuro: No Weakness, No headache       Home Medications:  Prior to Admission Medications   Prescriptions Last Dose Informant Patient Reported? Taking?   Ibuprofen-Diphenhydramine Cit (ADVIL PM PO)   Yes No   Sig: as needed (1 qhs).   allopurinol (ZYLOPRIM) 300 MG tablet   Yes No   Sig: Take 300 mg by mouth daily.   amLODIPINE (NORVASC) 5 MG tablet   Yes No   Sig:  Take 10 mg by mouth daily.   atenolol (TENORMIN) 25 MG tablet   Yes No   Sig: Take 25 mg by mouth daily.   docusate sodium (COLACE) 250 MG capsule   No No   Sig: Take 1 capsule by mouth twice daily while taking narcotic and as needed for constipation.   ibuprofen (MOTRIN) 200 MG tablet   Yes No   Sig: Take 200 mg by mouth every 6 hours as needed for Mild Pain (Pain Score 1-3).   meloxicam (MOBIC) 7.5 MG tablet   Yes No   Sig:     oxybutynin (DITROPAN) 5 MG tablet   No No   Sig: TAKE 1 TABLET BY MOUTH 3 TIMES DAILY AS NEEDED (BLADDER SPASM OR PAIN).   phenazopyridine (PYRIDIUM) 100 MG tablet   No No   Sig: TAKE 1 TABLET BY MOUTH 3 TIMES DAILY AS NEEDED FOR MILD PAIN   tizanidine (ZANAFLEX) 4 MG capsule   Yes No   Sig: Take 4 mg by mouth 3 times daily.   traMADol (ULTRAM) 50 MG tablet   No No   Sig: Take 1-2 tablets by mouth every 6 hours as needed for severe pain.      Facility-Administered Medications: None  Allergies:   Sulfa drugs    Past Medical History:   Past Medical History:   Diagnosis Date    Chronic suprapubic catheter (CMS-HCC)     Congenital hydronephrosis     Gout     Headache     Hematuria     Kidney disease     Kidney stones     Major depressive disorder, single episode     Polyarthropathy or polyarthritis of multiple sites     Urethral stricture        Past Surgical History:   Past Surgical History:   Procedure Laterality Date    APPENDECTOMY      CT INSERTION OF SUPRAPUBIC CATH  09/25/2015    CYSTOSCOPY      CYSTOSCOPY W/ LASER LITHOTRIPSY      NEPHRECTOMY Right 1995    OTHER SURGICAL HISTORY      Interstim 01/29/2011       Family History:  Family History   Adopted: Yes   Family history unknown: Yes       Social History:   Denies drug use    PHYSICAL EXAM  Vitals:    06/24/18 0615   BP: 139/70   Pulse: 69   Resp: 16   Temp: 98 F (36.7 C)   SpO2: 98%   Weight: 88.5 kg (195 lb)     Vital Signs stable  SpO2 measured to be 98% and interpreted as wnl    Gen: alert, non-toxic  appearing,  NAD, very well appearing  HEENT:Atraumatic, EOMI, MMM  Neck: full ROM   CV: RRR, no murmurs  Pulm: CTA b/l, non-labored breathing   Abd/GU: soft, non-tender, no rebound/guarding, suprapubic catheter c/d/i, no blood at external meatus, otherwise normal GU   Back:no CVA tenderness  MSK: MAEx4  Neuro: Alert, acting appropriately, SILTx4, normal gait, 5/5 strength in lower extremities   Skin: wwp  Ext:  no LE edema    Results for orders placed or performed during the hospital encounter of 06/24/18   Urinalysis with Culture Reflex, when indicated   Result Value Ref Range    Type Clean catch     Color Red (A) Yellow    Appearance Bloody (A) Clear    Specific Gravity 1.017 1.002 - 1.030    pH 6.0 5.0 - 8.0    Protein 2+ (A) Negative    Glucose 1+ (A) Negative    Ketones 1+ (A) Negative    Bilirubin Negative Negative    Blood 2+ (A) Negative    Urobilinogen Negative Negative    Nitrite Negative Negative    Leuk Esterase Negative Negative    WBC 0-2 0-2/HPF    RBC >50 (A) 0-2/HPF    Bacteria None None-Rare/HPF   CBC w/ Diff Lavender   Result Value Ref Range    WBC 8.9 4.0 - 10.0 1000/mm3    RBC 4.77 4.60 - 6.10 mill/mm3    Hgb 14.5 13.7 - 17.5 gm/dL    Hct 43.7 40.0 - 50.0 %    MCV 91.6 79.0 - 95.0 um3    MCH 30.4 26.0 - 32.0 pgm    MCHC 33.2 32.0 - 36.0 g/dL    RDW 14.4 (H) 12.0 - 14.0 %    MPV 10.0 9.4 - 12.4 fL    Plt Count 205 140 - 370 1000/mm3    Segs 46 %    Lymphocytes 42 %    Monocytes 8 %    Eosinophils 3 %  Basophils 0 %    ANC-Automated 4.1 1.6 - 7.0 1000/mm3    Abs Lymphs 3.7 (H) 0.8 - 3.1 1000/mm3    Abs Monos 0.7 0.2 - 0.8 1000/mm3    Abs Eosinophils 0.3 <0.1 - 0.5 1000/mm3    Abs Basophils 0.0 <0.1 1000/mm3    Diff Type Automated    Basic Metabolic Panel, Blood Green Plasma Separator Tube   Result Value Ref Range    Glucose 144 (H) 70 - 99 mg/dL    BUN 21 8 - 23 mg/dL    Creatinine 1.19 (H) 0.67 - 1.17 mg/dL    GFR >60 mL/min    Sodium 142 136 - 145 mmol/L    Potassium 3.8 3.5 - 5.1 mmol/L     Chloride 105 98 - 107 mmol/L    Bicarbonate 24 22 - 29 mmol/L    Anion Gap 13 7 - 15 mmol/L    Calcium 9.3 8.5 - 10.6 mg/dL   aPTT, Blood Blue   Result Value Ref Range    PTT 32 25 - 34 sec   Prothrombin Time, Blood Blue   Result Value Ref Range    PT,Patient 11.2 9.7 - 12.5 sec    INR 1.0          Medical Decision-Making & ED Course  67 year old male w/ hx as noted above significant for suprapubic catheter, history of urinary tension and intermittent difficulty voiding who presents with painless hematuria.  Patient's renal function is at baseline, no significant anemia, coags are normal patient does not take blood thinners.  Patient's bladder scan was notable for 1 L of fluid.  Attempted to flush catheter at bedside but was unsuccessful.  Urology was consulted and they were able to flush and replaced with a larger catheter.  Patient had resolution of his hematuria.  He was observed for 2 hours without any return of bloody urine. No e/o UTI. Pt has close f/u with urology, per their request pyridium prescribed and catheter supplies provided.       Urology recs which were verbally discussed:  Observe for 2 hours to ensure that urine does not become dark again.  Give pyridium at discharge  Pt has f/u all ready scheduled  Leave catheter to drainage, if not longer bloody after 1 week can cap again and void normally.       Patient seen and discussed with ED attending, Dr. Raynaldo Opitz.            Gordan Payment, MD  Resident  06/24/18 1341       Ramond Marrow, MD  06/24/18 1346

## 2018-06-24 NOTE — ED Notes (Addendum)
PVR >999.  Unable to void. No output through suprapubic catheter.

## 2018-06-24 NOTE — ED Notes (Signed)
Pt to ED from home for c/o hematuria that started this morning. PT voids but has a suprapubic cath in place. Pt c/o flank pain in triage. Pt AOx4 in NAD MD at bedside for evaluations.

## 2018-06-24 NOTE — Discharge Instructions (Signed)
Please leave the suprapubic catheter open to drain for 1 week. If it does not get bloody again, you can cap it at that time.  Follow up as planned with urology.        Hematuria    You have been diagnosed with hematuria.    Hematuria is the medical term for having blood in the urine. There are many causes for this condition. Some are serious, while others are not. Injury, infection, prostate enlargement and kidney stones are the most common causes.    The main symptom of hematuria is red or dark-colored urine. Sometimes blood clots block the opening of the bladder. This makes it hard to urinate (pee).    Some of the less common causes of hematuria can be very serious. For this reason, it is important to follow up with your regular doctor or a urologist to make sure your hematuria does not have a serious cause.    YOU SHOULD SEEK MEDICAL ATTENTION IMMEDIATELY, EITHER HERE OR AT THE NEAREST EMERGENCY DEPARTMENT, IF ANY OF THE FOLLOWING OCCURS:   You can't urinate or empty your bladder.   Weakness or lightheadedness at any time.   Any worsening or new symptoms or any other concerns.

## 2018-06-24 NOTE — ED Notes (Signed)
Leg bag placed.  Patient understands use.  Discharge instructions reviewed and understood.  avs signed and filed.  No further questions

## 2018-06-25 ENCOUNTER — Other Ambulatory Visit (HOSPITAL_BASED_OUTPATIENT_CLINIC_OR_DEPARTMENT_OTHER): Payer: Self-pay | Admitting: Pain Medicine

## 2018-06-25 ENCOUNTER — Ambulatory Visit: Admit: 2018-06-25 | Discharge: 2018-06-25 | Disposition: A | Discharge status: Home / Self Care | Payer: Medicaid Other

## 2018-06-25 DIAGNOSIS — R52 Pain, unspecified: Principal | ICD-10-CM

## 2018-06-25 LAB — HCV ANTIBODY WITH REFLEX QUANT: Hepatitis C Ab: NONREACTIVE

## 2018-06-26 ENCOUNTER — Ambulatory Visit
Admission: RE | Admit: 2018-06-26 | Discharge: 2018-06-26 | Disposition: A | Discharge status: Home / Self Care | Payer: Medicaid Other | Attending: Pain Medicine | Admitting: Pain Medicine

## 2018-06-26 ENCOUNTER — Telehealth (HOSPITAL_BASED_OUTPATIENT_CLINIC_OR_DEPARTMENT_OTHER): Payer: Self-pay | Admitting: Urology

## 2018-06-26 ENCOUNTER — Encounter (HOSPITAL_BASED_OUTPATIENT_CLINIC_OR_DEPARTMENT_OTHER): Admission: RE | Disposition: A | Discharge status: Home Health | Payer: Self-pay | Attending: Pain Medicine

## 2018-06-26 ENCOUNTER — Ambulatory Visit: Payer: Medicaid Other

## 2018-06-26 DIAGNOSIS — M47817 Spondylosis without myelopathy or radiculopathy, lumbosacral region: Secondary | ICD-10-CM

## 2018-06-26 DIAGNOSIS — M109 Gout, unspecified: Secondary | ICD-10-CM | POA: Insufficient documentation

## 2018-06-26 DIAGNOSIS — F329 Major depressive disorder, single episode, unspecified: Secondary | ICD-10-CM | POA: Insufficient documentation

## 2018-06-26 DIAGNOSIS — Z87442 Personal history of urinary calculi: Secondary | ICD-10-CM | POA: Insufficient documentation

## 2018-06-26 DIAGNOSIS — M47816 Spondylosis without myelopathy or radiculopathy, lumbar region: Secondary | ICD-10-CM | POA: Insufficient documentation

## 2018-06-26 DIAGNOSIS — R52 Pain, unspecified: Secondary | ICD-10-CM

## 2018-06-26 DIAGNOSIS — Z882 Allergy status to sulfonamides status: Secondary | ICD-10-CM | POA: Insufficient documentation

## 2018-06-26 SURGERY — PAIN INJECTION, PARAVERT FACET LUMBAR OR SACRAL 1ST
Anesthesia: Local | Laterality: Right

## 2018-06-26 MED ORDER — IOHEXOL 240 MG/ML IJ SOLN
INTRAMUSCULAR | Status: DC
Start: 2018-06-26 — End: 2018-06-26
  Filled 2018-06-26: qty 10

## 2018-06-26 MED ORDER — BUPIVACAINE HCL (PF) 0.5 % IJ SOLN
INTRAMUSCULAR | Status: DC
Start: 2018-06-26 — End: 2018-06-26
  Filled 2018-06-26: qty 10

## 2018-06-26 MED ORDER — BUPIVACAINE HCL (PF) 0.5 % IJ SOLN
INTRAMUSCULAR | Status: DC | PRN
Start: 2018-06-26 — End: 2018-06-26
  Administered 2018-06-26: 3 mL via PERINEURAL

## 2018-06-26 MED ORDER — DEXAMETHASONE SOD PHOSPHATE PF 10 MG/ML IJ SOLN
INTRAMUSCULAR | Status: DC
Start: 2018-06-26 — End: 2018-06-26
  Filled 2018-06-26: qty 1

## 2018-06-26 SURGICAL SUPPLY — 8 items
APPLICATOR CHLORAPREP 3ML, CLEAR (Misc Medical Supply) ×2 IMPLANT
GLOVE BIOGEL PI ULTRATOUCH SIZE 7 (Gloves/gowns) IMPLANT
GLOVE BIOGEL PI ULTRATOUCH SIZE 7.5 (Gloves/gowns)
GLOVE BIOGEL PI ULTRATOUCH SIZE 8 (Gloves/gowns)
MARKER SECURELINE SURG SKIN (Misc Medical Supply) ×2 IMPLANT
NEEDLE SPINE QUINCKE 23G X 3.5" (Needles/punch/cannula/biopsy) IMPLANT
NEEDLE SPINE QUINCKE 25G X 3.5" (Needles/punch/cannula/biopsy) IMPLANT
TRAY SINGLE SHOT EPIDURAL (Procedure Packs/kits) ×2

## 2018-06-26 NOTE — H&P (Signed)
Ambulatory Surgery/Invasive Procedure History and Physical      Primary Care Physician [name]    Chief Complaint:  low back pain     67 year old male here for scheduled procedure.    BP 122/66    Pulse 54    Temp 98.2 F (36.8 C)    SpO2 98%     Past Medical History:   Diagnosis Date    Chronic suprapubic catheter (CMS-HCC)     Congenital hydronephrosis     Gout     Headache     Hematuria     Kidney disease     Kidney stones     Major depressive disorder, single episode     Polyarthropathy or polyarthritis of multiple sites     Urethral stricture        Allergies   Allergen Reactions    Sulfa Drugs Unspecified       Current Facility-Administered Medications   Medication Dose Route Frequency Provider Last Rate Last Dose    dexamethasone (DECADRON) 10 MG/ML PF injection  - ADS OVERRIDE             iohexol (OMNIPAQUE 240) 240 MG/ML solution  - ADS OVERRIDE                I have reviewed the past medical history, allergies and current medications as documented in the electronic health record.      Physical Exam       Can this patient make their own healthcare decisions?  Yes    Chest:  Breaths easily     Heart:  RRR    Abdomen:  Soft    Pain Management Needs/Options discussed.    No Advanced Directives.      Resuscitative Status:  Full Code, Full Care    Diagnosis:    ICD-10-CM ICD-9-CM    1. Lumbosacral spondylosis without myelopathy M47.817 721.3     Added automatically from request for surgery [id number]   2. Pain R52 780.96        Procedure: Diagnostic Lumbar medial branch block    The procedure is designed to be diagnostic.    Discussed Risks, Benefits, and Alternatives to procedure.  Questions answered.  Patient voiced understanding and wished to proceed. Consent Signed.    See procedure note of same date.

## 2018-06-26 NOTE — Telephone Encounter (Signed)
Hello Stephen [name]     Patient stopped by office  was wondering if you can call him, has concerns in regards to catheter. Please review and advise .

## 2018-06-26 NOTE — Discharge Instructions (Signed)
Patient verbalized understanding of below instructions    Procedure Done Today: Lumbar diagnostic medial branch block    Follow up with Radio Frequency Ablation. Ordered by Dr. Thereasa Parkin  Scheduling will contact you.    Post Procedure Instructions  Continue present medication unless otherwise indicated.  Resume your normal diet after being discharge.  Ice pack to treatment site (no more than 20 minutes at a time) if needed for pain relief and/or muscle spasm.  Avoid strenuous activities and driving until tomorrow morning.  If you have a band-aid dressing, you may remove it tomorrow morning.  Resume normal activities tomorrow morning, unless otherwise directed.  No submersion in water for 24 hrs.  If your next appointment is at the Pain Clinic, then please call [phone] to make an appointment.  If your next appointment is another procedure, then please call [phone] to make an appointment.    Call the [facility],  [phone] and ask for the pain management provider on call for ANY sign of:  Temperature above 101.5 F  Redness or drainage at the treatment site  Severe, uncontrollable headache    Call [place]

## 2018-06-26 NOTE — Op Note (Signed)
Procedure Note, [facility]    Preoperative Diagnosis: Lumbar spondylosis without myelopathy    Postoperative Diagnosis: Lumbar spondylosis without myelopathy     Procedure : 1.  Injection, anesthetic agent and/or steroid, facet nerve, lumbar or sacral: Left L3 medial branch, Left L4 medial branch and Left L5 medial branch  2. Fluoroscopy for needle guidance.      Surgeon/Staff: Peter Minium, MD  Assistant: Maryellen Pile, MD     Anesthesia:  Lidocaine 0.5%    Fluoroscopy Time:   6     seconds    Indications: The patient's history and physical findings include axial low back pain consistent with Lumbar spondylosis without myelopathy . The patient is here for injection of anesthetic to the paravertebral facet joint nerves and to determine if they are a candidate for radiofrequency ablation. The pain has been present for greater than 3 months.  The patient has failed conservative therapy.  History and physical exam are consistent with lumbar spondylosis with axial pain.  The procedure is designed to be diagnostic.    Procedure in detail:  Written informed consent was obtained.  The chart was reviewed, questions were answered and the patient wished to proceed. The patient had no contraindications to the procedure. Vital signs were stable. Standard monitoring was applied. The patient, the procedure nurse, and the physician confirmed the site of injection after an official "time out." The skin over the injection site was also marked and confirmed as the site of the injection.    Localization Time Out:  An additional intraoperative timeout, specifically to confirm accurate localization, was conducted by the attending physician.     The patient remained awake and alert throughout the procedure.  The patient was placed in the prone position.  The skin was prepped with chlorhexidine and sterile drapes were applied. The skin and soft tissues were anesthetized with Lidocaine 0.5% at each site.      At the  Left L3 medial branch, Left L4 medial branch and Left L5 medial branch levels, a 23  gauge 3.5 inch needle was inserted percutaneously and advanced under fluoroscopic guidance using dorsal, lateral, and oblique projections to its radiographic target. For each of the lumbar levels, the tip of the needle was placed at the junction of the superior articular process and the transverse process. No paraesthesias occurred and aspiration was negative.  Next, paravertebral facet joint nerve blockade was performed by injection of Bupivicaine (0.5%)  0.5 mL at each level.     The needles were removed. Sterile bandages were applied. The patient did not experience any hemodynamic or neurologic sequelae. The attending physician was present during all critical points of the procedure.    The patient was transferred to the recovery area. The patient reoprted approximately 50% relief of their pain. Post operative instructions were explained and given to the patient.  The patient was discharged in stable health and given a The patient's response to this procedure meets the criteria to proceed with radiofrequency ablation of the treated nerves and  they agreed to proceed with this procedure . They will be scheduled for the next available appointment.Marland Kitchen

## 2018-06-28 NOTE — Telephone Encounter (Signed)
Incoming call from Patient requesting to speak with nurse    Patient expressed frustration no one has  returned his call     Patient informs on Sunday he had a relapse and had blood clots, Patient wants to know if this happens again, how to reach if clinic is closed     Please reach Patient at [phone]

## 2018-06-28 NOTE — Telephone Encounter (Signed)
Called pt. 2 id verified.  Pt stated that he had a bleeding and clotting episode that started on Sunday.   Pt wanted to know if this happens again does he need to go to [facility] and get a cystoscopy done by someone other than Dr Lucia Bitter.  Pt stated that as of today his urine is clear and everything is fine he is just trying to be preventative and make a plan as he has not had bleeding in a while.  Pt is aware that Haynes Dage is out of the office today but a message will be sent to her and Dr. Lucia Bitter to advise in pts next steps.

## 2018-06-29 NOTE — Telephone Encounter (Signed)
Spoke to pt & discussed recent bladder bleed & visit to ED. Pt booked for cysto 8/5 with Dr. Lucia Bitter. Encouraged to call if any bleeding prior to app't

## 2018-07-05 ENCOUNTER — Ambulatory Visit (HOSPITAL_BASED_OUTPATIENT_CLINIC_OR_DEPARTMENT_OTHER): Payer: Medicaid Other

## 2018-07-10 ENCOUNTER — Other Ambulatory Visit: Payer: Self-pay | Admitting: Family Medicine

## 2018-07-13 ENCOUNTER — Telehealth (HOSPITAL_BASED_OUTPATIENT_CLINIC_OR_DEPARTMENT_OTHER): Payer: Self-pay | Admitting: Anesthesiology

## 2018-07-13 NOTE — Telephone Encounter (Signed)
Pt is f/up on PA status for L-lumbar RFA, pt aware order has not been process to [facility]. Please advise.

## 2018-07-24 ENCOUNTER — Other Ambulatory Visit: Payer: Self-pay

## 2018-07-25 ENCOUNTER — Other Ambulatory Visit: Payer: Self-pay

## 2018-07-31 ENCOUNTER — Ambulatory Visit: Payer: Medicaid Other | Attending: Urology | Admitting: Urology

## 2018-07-31 VITALS — BP 129/64 | HR 59 | Temp 98.9°F | Resp 17 | Ht 68.0 in | Wt 210.8 lb

## 2018-07-31 DIAGNOSIS — R339 Retention of urine, unspecified: Principal | ICD-10-CM | POA: Insufficient documentation

## 2018-07-31 DIAGNOSIS — N3289 Other specified disorders of bladder: Secondary | ICD-10-CM

## 2018-07-31 DIAGNOSIS — N320 Bladder-neck obstruction: Secondary | ICD-10-CM | POA: Insufficient documentation

## 2018-07-31 DIAGNOSIS — Z905 Acquired absence of kidney: Secondary | ICD-10-CM

## 2018-07-31 MED ORDER — LIDOCAINE HCL 2% EX GEL (UROJET)
10.0000 mL | Freq: Once | Status: AC
Start: 2018-07-31 — End: 2018-07-31
  Administered 2018-07-31: 10 mL via URETHRAL

## 2018-07-31 NOTE — Patient Instructions (Signed)
Patient Instructions - Cystoscopy After Care     Post-Procedure Information: For approximately 8-12 hours following the cystoscopy, you might have to urinate more frequently than usual and you may experience burning during urination. These minor discomforts should go away within 12-24 hours. If they continue, contact your doctor.     Your urine may be slightly red following your cystoscopy, this is normal. However, if your urine is bright red or if you are passing clots, call the Urology Clinic. If you are unable to urinate, try sitting in a tub of warm water to help you relax. Urinate into the bath water.      If you are still unable to urinate, call the Urology Clinic and ask to speak to the Urology Nurse.     If you develop a fever and/or chills, you may have an infection. Continue to take any antibiotics your doctor may have prescribed. Do not use an old prescription.     If you break out in a rash, you may be having an allergic reaction to a drug. Call your doctor immediately. Discontinue taking your medication until you have been advised by your doctor.    Severe pain in the lower abdomen or low back may be an indication of bladder spasms or an infection. Contact the doctor if you experience continued discomfort.    Call the clinic and ask to speak to the nurse if you experience any of the following:   bright red bleeding or blood clots in your urine   burning or frequent urination after 24 hours    inability to urinate   fever and/or chills   severe pain in lower abdomen or low back   you break out in a rash     If you have any questions and it is after business hours of 8am to 4:30pm, please call the page operator at # [phone] and ask to speak to the provider on-call.

## 2018-07-31 NOTE — Interdisciplinary (Signed)
Patient prepped using sterile technique, Lidocaine Hydrochloride Jelly 2% (Urojet) inserted via urethra. Pt tolerated well. Pt left in satisfactory condition.    Urojet  Lot # [id number]  Exp 03/21    In room 0937  Time out called  0958   Procedure start   0958            Procedure finish 1004  Out room 1020

## 2018-07-31 NOTE — Progress Notes (Signed)
PCP: [name]  Date Today: 07/31/18     Chief complaint: hematuria    HPI:   Stephen [name] is a 67 year old male here for follow-up of hematuria.    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 Lucia Bitter, was seen recently in ED 06/24/2018 due to hematuria, clot retention, hand irrigated through SPT.    Current symptoms: He presents today for cystoscopy.    Medications:  Current Outpatient Medications on File Prior to Visit   Medication Sig Dispense Refill   . allopurinol (ZYLOPRIM) 300 MG tablet Take 300 mg by mouth daily.     Marland Kitchen amLODIPINE (NORVASC) 5 MG tablet Take 10 mg by mouth daily.     Marland Kitchen atenolol (TENORMIN) 25 MG tablet Take 25 mg by mouth daily.     Marland Kitchen docusate sodium (COLACE) 250 MG capsule Take 1 capsule by mouth twice daily while taking narcotic and as needed for constipation. 60 capsule 0   . ibuprofen (MOTRIN) 200 MG tablet Take 200 mg by mouth every 6 hours as needed for Mild Pain (Pain Score 1-3).     . Ibuprofen-Diphenhydramine Cit (ADVIL PM PO) as needed (1 qhs).     . meloxicam (MOBIC) 7.5 MG tablet       . oxybutynin (DITROPAN) 5 MG tablet TAKE 1 TABLET BY MOUTH 3 TIMES DAILY AS NEEDED (BLADDER SPASM OR PAIN). 15 each 0   . phenazopyridine (PYRIDIUM) 100 MG tablet Take 1 tablet (100 mg) by mouth 3 times daily. 20 tablet 0   . tizanidine (ZANAFLEX) 4 MG capsule Take 4 mg by mouth 3 times daily.     . traMADol (ULTRAM) 50 MG tablet Take 1-2 tablets by mouth every 6 hours as needed for severe pain. 10 tablet 0     No current facility-administered medications on file prior to visit.        Review of systems: no flu like sxs         OBJECTIVE:  Vital Signs:  BP 129/64 (BP Location: Left arm, BP Patient Position: Sitting, BP cuff size: Large)   Pulse 59   Temp 98.9 F (37.2 C) (Temporal)   Resp 17   Ht 5\' 8"  (1.727 m)   Wt 95.6 kg (210 lb 12.8 oz)    SpO2 97%   BMI 32.05 kg/m     Physical:    General: No acute distress, pleasant  Neuro:  Alert, oriented  Head:  Normocephalic, atraumatic  Neck:  Midline trachea, supple  Chest:  Nonlabored, symmetric  CV:  Regular rate, rhythm  GI:  Soft, nontender, nondistended  GU:  No CVA tenderness, no suprapubic tenderness  Skin:  No obvious rashes, no lesions  Psych:  Appropriate mood    Labs:  Lab Results   Component Value Date    WBC 8.9 06/24/2018    RBC 4.77 06/24/2018    HGB 14.5 06/24/2018    HCT 43.7 06/24/2018    MCV 91.6 06/24/2018    MCHC 33.2 06/24/2018    RDW 14.4 (H) 06/24/2018    PLT 205 06/24/2018    MPV 10.0 06/24/2018       Lab Results   Component Value Date    NA 142 06/24/2018    K 3.8 06/24/2018    CL 105 06/24/2018    BICARB 24 06/24/2018  BUN 21 06/24/2018    CREAT 1.19 (H) 06/24/2018    GLU 144 (H) 06/24/2018    [place] 9.3 06/24/2018       Lab Results   Component Value Date    COLORUA Red (A) 06/24/2018    APPEARUA Bloody (A) 06/24/2018    GLUCOSEUA 1+ (A) 06/24/2018    BILIUA Negative 06/24/2018    KETONEUA 1+ (A) 06/24/2018    SGUA 1.017 06/24/2018    BLOODUA 2+ (A) 06/24/2018    PHUA 6.0 06/24/2018    PROTEINUA 2+ (A) 06/24/2018    UROBILUA Negative 06/24/2018    NITRITEUA Negative 06/24/2018    LEUKESTUA Negative 06/24/2018    WBCUA 0-2 06/24/2018    RBCUA >50 (A) 06/24/2018       Lab Results   Component Value Date    INR 1.0 06/24/2018    PTT 32 06/24/2018         Creatinine   Date/Time Value Ref Range Status   06/24/2018 06:27 AM 1.19 (H) 0.67 - 1.17 mg/dL Final   03/03/2018 08:16 PM 1.63 (H) 0.67 - 1.17 mg/dL Final   02/28/2017 11:00 AM 1.16 0.67 - 1.17 mg/dL Final       No results found for: PSA    Cystoscopy 07/31/18    The patient was prepped and draped in the usual sterile fashion. Lidocaine gel was injected into the urethra. A 34fr flexible cystoscope was introduced into the meatus.    Findings:  Meatus:    Normal    Urethra   Anterior: Normal   Membranous: Normal   Prostatic: Normal    Bladder   UO:  Present bilaterally   Mucosa: Normal   Trabeculation: Moderate-Severe   Stones: None   Diverticuli: Yes     Patient tolerated the procedure without difficulty. Findings were discussed with the patient.    Open BN- able to take the scope without resistance. 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 Lucia Bitter, was seen recently in ED 06/24/2018 due to hematuria, clot retention, hand irrigated through SPT      - RTC in Dec 4 months    Lowry Ram, MD  Urology fellow    I personally reviewed the patient's history, and interviewed and examined the patient. I agree with the documentation completed by the fellow. I was present for the entire procedure. I directed  the assessment and plan as stated above.   Repeat cystoscopy in 4 months- stable [place]. Lucia Bitter, MD FACS

## 2018-08-07 NOTE — Telephone Encounter (Signed)
Patient is calling back regarding the previous message. He is aware that the order is still being processed. Patient is requesting to be contacted once a response is received from [place]. Please advise.

## 2018-08-13 NOTE — Telephone Encounter (Signed)
Lumbar facet completed on 06/26/18.

## 2018-08-21 NOTE — Telephone Encounter (Signed)
Patient is calling back regarding the previous message. He is aware that the order is still being processed. Offered to send a message so that he can be contacted with an update, but pt declined. Per patient, "I'm sure they'll let me know when they finish processing it." Please note.

## 2018-08-21 NOTE — Telephone Encounter (Signed)
AUTH submitted

## 2018-08-24 ENCOUNTER — Encounter (HOSPITAL_BASED_OUTPATIENT_CLINIC_OR_DEPARTMENT_OTHER): Payer: Self-pay | Admitting: Physical Medicine & Rehabilitation

## 2018-08-24 NOTE — Telephone Encounter (Signed)
AUTH denied. Denial letter has been uploaded to the media
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
## 2018-08-31 NOTE — Telephone Encounter (Signed)
Pt is f/up on previous messages in regards to his L-lumbar RFA, pt aware MD has received his message in regards to his PT not working. Pt is aware an appeal can be possible done. Please advise.

## 2018-09-17 ENCOUNTER — Telehealth (HOSPITAL_BASED_OUTPATIENT_CLINIC_OR_DEPARTMENT_OTHER): Payer: Self-pay | Admitting: Anesthesiology

## 2018-09-17 NOTE — Telephone Encounter (Signed)
Patient is calling back regarding the previous MyChart message dated 08/24/2018. He is requesting a call back with an update at [phone]. Please advise.
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
## 2018-10-01 ENCOUNTER — Other Ambulatory Visit: Payer: Self-pay

## 2018-10-04 ENCOUNTER — Encounter (HOSPITAL_BASED_OUTPATIENT_CLINIC_OR_DEPARTMENT_OTHER): Payer: Self-pay | Admitting: Anesthesiology

## 2018-10-04 ENCOUNTER — Ambulatory Visit: Payer: Medicaid Other | Attending: Anesthesiology | Admitting: Anesthesiology

## 2018-10-04 VITALS — BP 115/70 | HR 58 | Temp 98.0°F

## 2018-10-04 DIAGNOSIS — M533 Sacrococcygeal disorders, not elsewhere classified: Secondary | ICD-10-CM | POA: Insufficient documentation

## 2018-10-04 DIAGNOSIS — M47817 Spondylosis without myelopathy or radiculopathy, lumbosacral region: Secondary | ICD-10-CM | POA: Insufficient documentation

## 2018-10-04 NOTE — Patient Instructions (Signed)
Please allow up to 14 business days for the authorization to be processed and you will be contacted to schedule your procedure once it has been approved.    Please call [phone] To Check on Status of Authorization    Please call [phone] option #0 To Schedule Your Procedure once it's appoved     Follow Up Appointment  After procedure      If you have any questions please don't hesitate to call our clinic at [phone].

## 2018-10-04 NOTE — Progress Notes (Signed)
Attending Attestation:    I personally interviewed and examined the patient on 10/04/2018 , and I have reviewed the note by Dr. Vernard Gambles from 10/04/2018 .    I agree w/ the fellow history, exam, assessment, and plan with the following additions:     Treatment Planning Summary:  He reports >50% pain reduction for 8 hours after the lumbar medial branch block.  He also has pain over his left SI joint.  Recommend proceeding with the left L3-S1 medial branch RFA.  If no relief, left sacroiliac joint injection.

## 2018-10-04 NOTE — Progress Notes (Signed)
PAIN CLINIC FOLLOW UP NOTE    Primary Care Physician [name]    SUBJECTIVE:  This is a 67 year old male.  The patient is here for a follow up for: neck pain and low back pain.  He also has other nonspecific complaints including leg stiffness when initiating ambulation and leg swelling.  He has had a cervical and lumbar ESI with minimal relief.  Had a cervical MRI.  Most pain is in low back.    Current Pain Description  Patient comes back after left L3-5 MBB on 06/26/2018. Patient states his pain was 8/10 prior to injection and 4/10 for 8 hours post MBB. Continues to report similar pain. Describes pain as dull, ache 'feels like arthritis'. Non radiating. Associated with tightness/spasms, but no weakness, sensation disturbances, or any neurological symptoms. Worse in the morning and movements. Denies any motor weakness, saddle anesthesia, bowel and bladder incontinences.    Patient also describes pain: similar arthritic pain in nature, isolated in left SI joint, worsen with sitting/immobility, without motor weakness and sensation disturbances.    Patient reports 20% improvement in their pain with use of their current pain medications. On the pain diagram patient shades in the neck, low back, bilateral buttocks.     Patient stated their pain score today is 8/10 . This pain has made it hard for the patient to sleep, sit and exercise. Patient's highest pain level in the last week is 8/10. Patient's lowest pain level in the past week is 8/10. Patient's average pain level in the last week is: 8/10. Interfered with enjoyment of life and general activity: 5/10 and 7/10 respectively.     Pain Treatments  Previous pain procedures and response include:   Left L3-5 MBB 06/26/2018  Cervical ESI 01/23/2018   Lumbar ESI 11/24/2018  Patient is currently using the following pain medications: meloxicam, tizanidine which he feels is not helping.  This was being prescribed by his last pain physician.  When asked who is prescribing this  now, he repsponded that I was taking it over.    Patient's significant social history is:   Social History     Socioeconomic History   . Marital status: Single     Spouse name: Not on file   . Number of children: Not on file   . Years of education: Not on file   . Highest education level: Not on file   Occupational History   . Not on file   Social Needs   . Financial resource strain: Not on file   . Food insecurity:     Worry: Not on file     Inability: Not on file   . Transportation needs:     Medical: Not on file     Non-medical: Not on file   Tobacco Use   . Smoking status: Never Smoker   . Smokeless tobacco: Never Used   Substance and Sexual Activity   . Alcohol use: Not Currently   . Drug use: Not on file   . Sexual activity: Never   Lifestyle   . Physical activity:     Days per week: Not on file     Minutes per session: Not on file   . Stress: Not on file   Relationships   . Social connections:     Talks on phone: Not on file     Gets together: Not on file     Attends religious service: Not on file  Active member of club or organization: Not on file     Attends meetings of clubs or organizations: Not on file     Relationship status: Not on file   . Intimate partner violence:     Fear of current or ex partner: Not on file     Emotionally abused: Not on file     Physically abused: Not on file     Forced sexual activity: Not on file   Other Topics Concern   . Not on file   Social History Narrative   . Not on file       Review of Systems   General: Poor sleep  Cardiovascular: Negative  Gastrointestinal: Negative  Genito/Reproductive: Negative  Endocrine: Negative  Psychiatry: Difficulty falling asleep  EENT: Negative  Respiratory: Negative  Urinary: Negative  Musculoskeletal: Joint pain/swelling and Muscle pain  Skin: Dryness/itching  Neurological: Negative    Past medical history is significant for:   has a past medical history of Chronic suprapubic catheter (CMS-HCC), Congenital hydronephrosis, Gout,  Headache, Hematuria, Kidney disease, Kidney stones, Major depressive disorder, single episode, Polyarthropathy or polyarthritis of multiple sites, and Urethral stricture.    Current medications include:  Current Outpatient Medications   Medication Sig Dispense Refill   . allopurinol (ZYLOPRIM) 300 MG tablet Take 300 mg by mouth daily.     Marland Kitchen amLODIPINE (NORVASC) 5 MG tablet Take 10 mg by mouth daily.     Marland Kitchen atenolol (TENORMIN) 25 MG tablet Take 25 mg by mouth daily.     Marland Kitchen docusate sodium (COLACE) 250 MG capsule Take 1 capsule by mouth twice daily while taking narcotic and as needed for constipation. 60 capsule 0   . ibuprofen (MOTRIN) 200 MG tablet Take 200 mg by mouth every 6 hours as needed for Mild Pain (Pain Score 1-3).     . Ibuprofen-Diphenhydramine Cit (ADVIL PM PO) as needed (1 qhs).     . meloxicam (MOBIC) 7.5 MG tablet       . oxybutynin (DITROPAN) 5 MG tablet TAKE 1 TABLET BY MOUTH 3 TIMES DAILY AS NEEDED (BLADDER SPASM OR PAIN). 15 each 0   . phenazopyridine (PYRIDIUM) 100 MG tablet Take 1 tablet (100 mg) by mouth 3 times daily. 20 tablet 0   . tizanidine (ZANAFLEX) 4 MG capsule Take 4 mg by mouth 3 times daily.     . traMADol (ULTRAM) 50 MG tablet Take 1-2 tablets by mouth every 6 hours as needed for severe pain. 10 tablet 0     No current facility-administered medications for this visit.        Patient's current allergies are:  Sulfa drugs    OBJECTIVE:  Physical Exam  Vitals: BP 115/70 (BP Location: Right arm, BP Patient Position: Sitting, BP cuff size: Large)   Pulse 58   Temp 98 F (36.7 C) (Oral)   General:  Well-developed, well-nourished, cooperative, in no acute distress.  Mental Status:  Alert, oriented x3. Speech is clear and fluent.  Affect:  Euthymic.  Skin:  No rashes or bruises.  HEENT:  Pupils equal, not pinpoint.  Pulmonary:  Breathing easily without tachypnea or bradypnea.  Cardiac:  No LE edema.  Abdomen:  Not distended.  Ambulation: Pt is able to raise from a seated position without  difficulty. Gait is not antalgic and the patient ambulates without assistance.     Musculoskeletal:   Neck: limited ROM due to pain.  Diffusely tender with no focal areas of pain.  Lumbar: +TTP over  lumbar facets and facet loading.  Pain with extension and rotation, otherwise good ROM.  Left SI: +Fortin fingers, Gaenselen's, Faber's  Upper Ext: NL  Lower Ext: NL  Neurosensory: Motor exam: intact. Sensory exam intact. Strength intact.    Encounter Diagnoses   Name Primary?   . Lumbosacral spondylosis without myelopathy Yes   . Sacroiliac joint dysfunction        ASSESSMENT  This is a 67 year old male with chronic low back pain who comes after left L3-5 MBB on 06/26/2018. Patient states his pain was 8/10 prior to injection and 4/10 for 8 hours post MBB. Given positive response to block, we will proceed with RFA.     Patient also has a component of SI joint pain given history (arthritic pain, isolated pain to over SI joint), physical exam (+Fortin's finger, Faber's, and Gaenslen's), we may consider SI joint injection in the future pending response to his RFA.    Of note, He was again difficult to follow and not quite sure what his goals are. Patient was not clear on why he could not communicate with PCP for his BLE swelling, although we re-emphasized the importance to follow up with his PCP.    PLAN  Medication Plan:   1. None at this time. We will proceed with interventions and may consider Cymbalta in the future.    Procedure Plan:   1. Again, given the positive response to diagnostic block, we will proceed with left L3-5 RFA.  2. We may consider left SI joint injection in the future.    Follow-up: after above      Jay Pain Fellow

## 2018-11-19 ENCOUNTER — Other Ambulatory Visit: Payer: Self-pay

## 2018-11-20 ENCOUNTER — Ambulatory Visit: Payer: BLUE CROSS/BLUE SHIELD | Admitting: Family Medicine

## 2018-11-21 ENCOUNTER — Ambulatory Visit (INDEPENDENT_AMBULATORY_CARE_PROVIDER_SITE_OTHER): Payer: Managed Care, Other (non HMO) | Admitting: Internal Medicine

## 2018-11-21 ENCOUNTER — Encounter

## 2018-11-21 ENCOUNTER — Encounter: Payer: Self-pay | Admitting: Internal Medicine

## 2018-11-21 VITALS — BP 126/72 | HR 84 | Temp 98.6°F | Wt 235.0 lb

## 2018-11-21 DIAGNOSIS — J0100 Acute maxillary sinusitis, unspecified: Secondary | ICD-10-CM

## 2018-11-21 DIAGNOSIS — J441 Chronic obstructive pulmonary disease with (acute) exacerbation: Secondary | ICD-10-CM

## 2018-11-21 MED ORDER — DEXAMETHASONE SODIUM PHOSPHATE 10 MG/ML IJ SOLN
10.0000 mg | Freq: Once | INTRAMUSCULAR | Status: AC
  Administered 2018-11-21: 10 mg via INTRAMUSCULAR

## 2018-11-21 MED ORDER — ALBUTEROL SULFATE HFA 108 (90 BASE) MCG/ACT IN AERS: 2.0000 | INHALATION_SPRAY | Freq: Four times a day (QID) | RESPIRATORY_TRACT | 0 refills | Status: DC | PRN

## 2018-11-21 MED ORDER — CEFTRIAXONE SODIUM 1 G IJ SOLR
1.0000 g | Freq: Once | INTRAMUSCULAR | Status: AC
  Administered 2018-11-21: 1 g via INTRAMUSCULAR

## 2018-11-21 NOTE — Addendum Note (Signed)
Addended by: Lurlean Nanny on: 11/21/2018 04:46 PM   Modules accepted: Orders

## 2018-11-21 NOTE — Patient Instructions (Signed)
Chronic Obstructive Pulmonary Disease Exacerbation  Chronic obstructive pulmonary disease (COPD) is a common lung problem. In COPD, the flow of air from the lungs is limited. COPD exacerbations are times that breathing gets worse and you need extra treatment. Without treatment they can be life threatening. If they happen often, your lungs can become more damaged. If your COPD gets worse, your doctor may treat you with:  ? Medicines.  ? Oxygen.  ? Different ways to clear your airway, such as using a mask.    Follow these instructions at home:  ? Do not smoke.  ? Avoid tobacco smoke and other things that bother your lungs.  ? If given, take your antibiotic medicine as told. Finish the medicine even if you start to feel better.  ? Only take medicines as told by your doctor.  ? Drink enough fluids to keep your pee (urine) clear or pale yellow (unless your doctor has told you not to).  ? Use a cool mist machine (vaporizer).  ? If you use oxygen or a machine that turns liquid medicine into a mist (nebulizer), continue to use them as told.  ? Keep up with shots (vaccinations) as told by your doctor.  ? Exercise regularly.  ? Eat healthy foods.  ? Keep all doctor visits as told.  Get help right away if:  ? You are very short of breath and it gets worse.  ? You have trouble talking.  ? You have bad chest pain.  ? You have blood in your spit (sputum).  ? You have a fever.  ? You keep throwing up (vomiting).  ? You feel weak, or you pass out (faint).  ? You feel confused.  ? You keep getting worse.  This information is not intended to replace advice given to you by your health care provider. Make sure you discuss any questions you have with your health care provider.  Document Released: 11/24/2011 Document Revised: 05/12/2016 Document Reviewed: 08/09/2013  Elsevier Interactive Patient Education ? 2017 Elsevier Inc.

## 2018-11-21 NOTE — Progress Notes (Signed)
HPI  Pt presents to the clinic today with c/o nasal congestion, cough and chest congestion. He reports this started 1 week ago. He is not blowing anything out of his nose. The cough is productive of green mucous. He denies runny nose, ear pain, sore throat or shortness of breath. He reports fever up to 100.0, chills and body aches. He has not tried anything OTC. He has a history of COPD. He has not had sick contacts that he is aware of.  Review of Systems     Past Medical History:  Diagnosis Date  . Aortic regurgitation 10/15/2012   Echocardiogram showing calcified aortic valves, mild AS, mod AR, preserved EF 55-65%, mild LV dilation and LVH (02/2017). rec rpt 1 yr.   Marland Kitchen CAD (coronary artery disease) 01/2017   4v by CT  . Carotid stenosis 01/12/2016   Minimal on [name] (01/2016) f/u PRN   . COPD (chronic obstructive pulmonary disease) ([place]) 01/2017   mild paraseptal and centrilobular by CT  . Diverticulitis   . Diverticulosis of colon   . Duodenal ulcer   . Hepatic steatosis 01/2017   by CT  . History of smoking   . HLD (hyperlipidemia)   . Right shoulder pain    impingement with partial RTC tear ([place])  . Thoracic aorta atherosclerosis ([place]) 01/2017   by CT    Family History  Problem Relation Age of Onset  . Cirrhosis Father        + alcohol  . Heart failure Father   . Emphysema Father        + smoker  . Heart attack Father 15       x 4  . Alcohol abuse Father   . COPD Mother        + smoker  . Macular degeneration Mother   . Cataracts Mother        Bilateral--removed  . Diabetes Paternal Grandmother   . Breast cancer Maternal Aunt   . Depression Sister   . Alcohol abuse Sister   . Alzheimer's disease Unknown        Aunt    Social History   Socioeconomic History  . Marital status: Married    Spouse name: Not on file  . Number of children: 3  . Years of education: Not on file  . Highest education level: Not on file  Occupational History  . Occupation:  Runner, broadcasting/film/video: [employer]  . Financial resource strain: Not on file  . Food insecurity:    Worry: Not on file    Inability: Not on file  . Transportation needs:    Medical: Not on file    Non-medical: Not on file  Tobacco Use  . Smoking status: Former Smoker    Packs/day: 1.00    Years: 42.00    Pack years: 42.00    Last attempt to quit: 08/13/2011    Years since quitting: 7.2  . Smokeless tobacco: Never Used  . Tobacco comment: Using electronic cigarettes now  Substance and Sexual Activity  . Alcohol use: Yes    Comment: Social  . Drug use: No  . Sexual activity: Not on file  Lifestyle  . Physical activity:    Days per week: Not on file    Minutes per session: Not on file  . Stress: Not on file  Relationships  . Social connections:    Talks on phone: Not on file    Gets together: Not  on file    Attends religious service: Not on file    Active member of club or organization: Not on file    Attends meetings of clubs or organizations: Not on file    Relationship status: Not on file  . Intimate partner violence:    Fear of current or ex partner: Not on file    Emotionally abused: Not on file    Physically abused: Not on file    Forced sexual activity: Not on file  Other Topics Concern  . 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 Erythromycin     REACTION: ANAPHYLACTIC SHOCK     Constitutional: Positive fever. Denies headache, fatigue, abrupt weight changes.  HEENT:  Positive facial pain, nasal congestion. Denies eye redness, ear pain, ringing in the ears, wax buildup, runny nose or bloody nose. Respiratory: Positive cough. Denies difficulty breathing or  shortness of breath.  Cardiovascular: Denies chest pain, chest tightness, palpitations or swelling in the hands or feet.   No other specific complaints in a complete review of systems (except as listed in HPI above).  Objective:   BP 126/72   Pulse 84   Temp 98.6 F (37 C) (Oral)   Wt 235 lb (106.6 kg)   SpO2 97%   BMI 35.73 kg/m   General: Appears his stated age, in NAD. HEENT: Head: normal shape and size, maxillary sinus tenderness noted;  Ears: Tm's gray and intact, normal light reflex; Nose: mucosa boggy and moist, septum midline; Throat/Mouth: + PND. Teeth present, mucosa pink and moist, no exudate noted, no lesions or ulcerations noted.  Neck:  No adenopathy noted.  Cardiovascular: Normal rate and rhythm.  Pulmonary/Chest: Normal effort and positive vesicular breath sounds with bilateral expiratory wheezing noted. No respiratory distress. No rales or ronchi noted.       Assessment & Plan:   Acute Maxillary Sinusitis, COPD Exacerbation:  Can use a Neti Pot which can be purchased from your local drug store. Decadron 10 mg IM today Rocephin 1 gm IM today RX for Albuterol inhaler Delsym as needed for cough  RTC as needed or if symptoms persist. Webb Silversmith, NP

## 2018-11-23 ENCOUNTER — Other Ambulatory Visit (HOSPITAL_BASED_OUTPATIENT_CLINIC_OR_DEPARTMENT_OTHER): Payer: Self-pay | Admitting: Anesthesiology

## 2018-11-23 DIAGNOSIS — M47817 Spondylosis without myelopathy or radiculopathy, lumbosacral region: Secondary | ICD-10-CM

## 2018-11-26 ENCOUNTER — Encounter (HOSPITAL_BASED_OUTPATIENT_CLINIC_OR_DEPARTMENT_OTHER): Admission: RE | Disposition: A | Discharge status: Home Health | Payer: Self-pay | Attending: Anesthesiology

## 2018-11-26 ENCOUNTER — Ambulatory Visit
Admission: RE | Admit: 2018-11-26 | Discharge: 2018-11-26 | Disposition: A | Discharge status: Home / Self Care | Payer: Medicare Other | Attending: Anesthesiology | Admitting: Anesthesiology

## 2018-11-26 ENCOUNTER — Ambulatory Visit: Payer: Medicaid Other

## 2018-11-26 DIAGNOSIS — F329 Major depressive disorder, single episode, unspecified: Secondary | ICD-10-CM | POA: Insufficient documentation

## 2018-11-26 DIAGNOSIS — M130 Polyarthritis, unspecified: Secondary | ICD-10-CM | POA: Insufficient documentation

## 2018-11-26 DIAGNOSIS — M109 Gout, unspecified: Secondary | ICD-10-CM | POA: Insufficient documentation

## 2018-11-26 DIAGNOSIS — M545 Low back pain: Secondary | ICD-10-CM | POA: Insufficient documentation

## 2018-11-26 DIAGNOSIS — Z882 Allergy status to sulfonamides status: Secondary | ICD-10-CM | POA: Insufficient documentation

## 2018-11-26 DIAGNOSIS — M47817 Spondylosis without myelopathy or radiculopathy, lumbosacral region: Secondary | ICD-10-CM

## 2018-11-26 DIAGNOSIS — M47816 Spondylosis without myelopathy or radiculopathy, lumbar region: Secondary | ICD-10-CM | POA: Insufficient documentation

## 2018-11-26 DIAGNOSIS — Z87442 Personal history of urinary calculi: Secondary | ICD-10-CM | POA: Insufficient documentation

## 2018-11-26 SURGERY — PAIN RFA PARAVERT FACET, LUMBAR, SINGLE

## 2018-11-26 MED ORDER — BUPIVACAINE HCL (PF) 0.5 % IJ SOLN
INTRAMUSCULAR | Status: DC
Start: 2018-11-26 — End: 2018-11-26
  Filled 2018-11-26: qty 10

## 2018-11-26 MED ORDER — DEXAMETHASONE SODIUM PHOSPHATE 10 MG/ML IJ SOLN (CUSTOM)
INTRAMUSCULAR | Status: DC
Start: 2018-11-26 — End: 2018-11-26
  Filled 2018-11-26: qty 1

## 2018-11-26 MED ORDER — DEXAMETHASONE SODIUM PHOSPHATE 10 MG/ML IJ SOLN (CUSTOM)
INTRAMUSCULAR | Status: DC | PRN
Start: 2018-11-26 — End: 2018-11-26
  Administered 2018-11-26: 5 mg via INTRAMUSCULAR

## 2018-11-26 MED ORDER — BUPIVACAINE HCL (PF) 0.5 % IJ SOLN
INTRAMUSCULAR | Status: DC | PRN
Start: 2018-11-26 — End: 2018-11-26
  Administered 2018-11-26: 1.5 mL

## 2018-11-26 MED ORDER — LIDOCAINE HCL (PF) 2 % IJ SOLN
INTRAMUSCULAR | Status: DC | PRN
Start: 2018-11-26 — End: 2018-11-26
  Administered 2018-11-26: 1.5 mL

## 2018-11-26 MED ORDER — LIDOCAINE HCL (PF) 2 % IJ SOLN
INTRAMUSCULAR | Status: DC
Start: 2018-11-26 — End: 2018-11-26
  Filled 2018-11-26: qty 5

## 2018-11-26 SURGICAL SUPPLY — 15 items
APPLICATOR CHLORAPREP 3ML, CLEAR (Misc Medical Supply) ×2 IMPLANT
CANNULA RF 16G X 100MM, CURVED SHARP (Needles/punch/cannula/biopsy) IMPLANT
CANNULA RF 20G X 100CM, CURVED SHARP, 10MM TIP (Needles/punch/cannula/biopsy) ×8
CANNULA RF 20G X 145CM, CURVED SHARP, 10MM TIP (Needles/punch/cannula/biopsy) IMPLANT
CANNULA RF 22G X 100CM, CURVED SHARP, 10MM TIP (Needles/punch/cannula/biopsy)
CANNULA RF 22G X 54CM, CURVED SHARP, 5MM TIP (Needles/punch/cannula/biopsy)
GLOVE BIOGEL PI ULTRATOUCH SIZE 7 (Gloves/gowns) IMPLANT
GLOVE BIOGEL PI ULTRATOUCH SIZE 7.5 (Gloves/gowns) IMPLANT
GLOVE BIOGEL PI ULTRATOUCH SIZE 8 (Gloves/gowns) IMPLANT
MARKER SECURELINE SURG SKIN (Misc Medical Supply) ×2 IMPLANT
PAD GROUNDING THERMOGARD (Misc Surgical Supply) ×2 IMPLANT
PROBE RF CURVED DISPOSABLE 20G 10CM (Needles/punch/cannula/biopsy) ×8
PROBE RF CURVED DISPOSABLE 22G 10CM (Needles/punch/cannula/biopsy) IMPLANT
PROBE RF DISPOSABLE 22G X 54MM (Needles/punch/cannula/biopsy) IMPLANT
TRAY SINGLE SHOT EPIDURAL (Procedure Packs/kits) ×2 IMPLANT

## 2018-11-26 NOTE — Discharge Instructions (Signed)
Patient verbalized understanding of below instructions    Procedure Done Today: left L3,4,5 high temperature radiofrequency ablation    Return to Pain Clinic as needed.  Please give at least a month for this procedure to work    Post Procedure Instructions  Continue present medication unless otherwise indicated.  Resume your normal diet after being discharge.  Ice pack to treatment site (no more than 20 minutes at a time) if needed for pain relief and/or muscle spasm.  Avoid strenuous activities and driving until tomorrow morning.  If you have a band-aid dressing, you may remove it tomorrow morning.  Resume normal activities tomorrow morning, unless otherwise directed.  No submersion in water for 24 hrs.  If your next appointment is at the Pain Clinic, then please call [phone] to make an appointment.  If your next appointment is another procedure, then please call [phone] to make an appointment.    Call the [facility],  [phone] and ask for the pain management provider on call for ANY sign of:  Temperature above 101.5 F  Redness or drainage at the treatment site  Severe, uncontrollable headache    Call [place]

## 2018-11-26 NOTE — Op Note (Signed)
Procedure Note, [facility]    Preoperative Diagnosis: Lumbar spondylosis without myelopathy    Postoperative Diagnosis: Lumbar spondylosis without myelopathy    Procedure:  1.  Destruction by radiofrequency thermocoagulation of paravertebral facet joint nerves;, left L4-5 and L5-S1 facet joint     2. Fluoroscopy for needle guidance.      Surgeon/Staff:  Sunday Corn, MD  Assistant: Elon Jester, M.D.    Anesthesia:  Lidocaine 1%                            Indications: The patient's history and physical findings include axial low back pain consistent with Lumbar spondylosis without myelopathy.  The patient is here for radiofrequency thermocoagulation of the paravertebral facet joint nerves to provide prolonged symptomatic pain relief.    History and physical exam are consistent with lumbar spondylosis with axial pain.  The patient has shown greater than 50% improvement of index pain with 0.73ml of local anesthetic for at least 3 hours.  The patient has had extended relief with this procedure at the same levels of greater than 50% for at least 3 months.  The procedure is designed to be therapeutic.    Procedure in detail:  Written informed consent was obtained.  The chart was reviewed, questions were answered and the patient wished to proceed. The patient had no contraindications to the procedure.  Vital signs were stable. Standard monitoring was applied.  The patient, the procedure nurse, and the physician confirmed the site of injection after an official "time out."  The skin over the injection site was also marked and confirmed as the site of the injection.    Localization Time Out:  An additional intraoperative timeout, specifically to confirm accurate localization, was conducted by the attending physician.    The patient remained awake and alert throughout the procedure.  The patient was placed in the prone position.  The skin was prepped with chlorhexidine and sterile drapes were  applied. The skin and soft tissues were anesthetized with Lidocaine 1% at each site.      At the L3-S1 levels, a 20 gauge 100 mm 10 mm curved sharp RF Cannula  was inserted percutaneously and advanced under fluoroscopic guidance using dorsal, lateral, and oblique projections to its radiographic target. For each of the lumbar levels, the tip of the needle was placed at the junction of the superior articular process and the transverse process.  For each of the sacral levels, the tip of the needle was placed at the superior lateral border of the respective neuroforamen.  No paraesthesias occurred and aspiration was negative  Needle placement was confirmed radiographically., Needle placement was confirmed with neurologic stimulation., Axial sensory perception threshold using 50 Hz sensory stimulation was achieved at 0.5V or less at each level., Extremity motor stimulation at 2 Hz was negative at a minimum of 1.5 V, or 2-3 times the sensory threshold at each level., Motor stimulation was positive for multifidus stimulation.  Next, paravertebral facet joint nerve blockade was performed by injection of 1 ml of a solution of Dexamethasone (10mg /mL)  0.5 mL, Lidocaine (2%) 2 mL and Bupivicaine (0.5%)  1.5 mL.    Each nerve was then treated at 80 degress C in continuous RF mode, for 1.5 min, each needle was rotated 90 degrees and repeated..    The needles were removed. Sterile bandages were applied. The patient did not experience any hemodynamic or neurologic sequelae. The attending physician  was present during all critical points of the procedure.     The patient was transferred to the recovery area.  Post operative instructions were explained and given to the patient.  The patient was discharged in stable health

## 2018-11-26 NOTE — H&P (Signed)
Ambulatory Surgery/Invasive Procedure History and Physical      Primary Care Physician [name]    Chief Complaint:  low back pain     67 year old male here for a left L3-S1 MBB RFA for pain control.    BP 146/76   Pulse 72   Temp 99 F (37.2 C)   Resp 10   SpO2 96%     Past Medical History:   Diagnosis Date   . Chronic suprapubic catheter (CMS-HCC)    . Congenital hydronephrosis    . Gout    . Headache    . Hematuria    . Kidney disease    . Kidney stones    . Major depressive disorder, single episode    . Polyarthropathy or polyarthritis of multiple sites    . Urethral stricture        Allergies   Allergen Reactions   . Sulfa Drugs Unspecified       Current Facility-Administered Medications   Medication Dose Route Frequency Provider Last Rate Last Dose   . bupivacaine 0.5 % PF injection  - ADS OVERRIDE            . bupivacaine 0.5 % PF injection    Intra-Op PRN Sunday Corn, MD   1.5 mL at 11/26/18 1551   . dexAMETHasone (DECADRON) 10 MG/ML 10 mg/mL injection  - ADS OVERRIDE            . dexAMETHasone (DECADRON) 10 mg/mL injection    Intra-Op PRN Sunday Corn, MD   5 mg at 11/26/18 1551   . lidocaine (XYLOCAINE) 2% PF injection  - ADS OVERRIDE            . lidocaine (XYLOCAINE) 2% PF injection  - ADS OVERRIDE            . lidocaine (XYLOCAINE) 2% PF injection    Intra-Op PRN Sunday Corn, MD   1.5 mL at 11/26/18 1551       I have reviewed the past medical history, allergies and current medications as documented in the electronic health record.      Physical Exam       Can this patient make their own healthcare decisions?  Yes    Chest:  Breaths easily     Heart:  RRR    Abdomen:  Soft    Pain Management Needs/Options discussed.    No Advanced Directives.      Resuscitative Status:  Full Code, Full Care    Diagnosis:    ICD-10-CM ICD-9-CM    1. Lumbosacral spondylosis without myelopathy M47.817 721.3        Procedure: Lumbar medial branch radiofrequency ablation    This is the  first injection of this type.     Discussed Risks, Benefits, and Alternatives to procedure.  Questions answered.  Patient voiced understanding and wished to proceed. Consent Signed.    See procedure note of same date.

## 2018-12-03 ENCOUNTER — Other Ambulatory Visit: Payer: Self-pay

## 2018-12-03 ENCOUNTER — Emergency Department
Admission: EM | Admit: 2018-12-03 | Discharge: 2018-12-04 | Disposition: A | Discharge status: Home / Self Care | Payer: Managed Care, Other (non HMO) | Attending: Emergency Medicine | Admitting: Emergency Medicine

## 2018-12-03 ENCOUNTER — Emergency Department: Payer: Managed Care, Other (non HMO)

## 2018-12-03 DIAGNOSIS — I2510 Atherosclerotic heart disease of native coronary artery without angina pectoris: Secondary | ICD-10-CM | POA: Insufficient documentation

## 2018-12-03 DIAGNOSIS — R202 Paresthesia of skin: Secondary | ICD-10-CM | POA: Diagnosis present

## 2018-12-03 DIAGNOSIS — Z7982 Long term (current) use of aspirin: Secondary | ICD-10-CM | POA: Insufficient documentation

## 2018-12-03 DIAGNOSIS — Z87891 Personal history of nicotine dependence: Secondary | ICD-10-CM | POA: Diagnosis not present

## 2018-12-03 DIAGNOSIS — J449 Chronic obstructive pulmonary disease, unspecified: Secondary | ICD-10-CM | POA: Diagnosis not present

## 2018-12-03 DIAGNOSIS — G510 Bell's palsy: Secondary | ICD-10-CM | POA: Insufficient documentation

## 2018-12-03 LAB — DIFFERENTIAL
Abs Immature Granulocytes: 0.06 10*3/uL (ref 0.00–0.07)
Basophils Absolute: 0 10*3/uL (ref 0.0–0.1)
Basophils Relative: 1 %
Eosinophils Absolute: 0.3 10*3/uL (ref 0.0–0.5)
Eosinophils Relative: 3 %
Immature Granulocytes: 1 %
Lymphocytes Relative: 21 %
Lymphs Abs: 1.9 10*3/uL (ref 0.7–4.0)
Monocytes Absolute: 0.8 10*3/uL (ref 0.1–1.0)
Monocytes Relative: 9 %
Neutro Abs: 5.8 10*3/uL (ref 1.7–7.7)
Neutrophils Relative %: 65 %

## 2018-12-03 LAB — COMPREHENSIVE METABOLIC PANEL
ALT: 26 U/L (ref 0–44)
AST: 27 U/L (ref 15–41)
Albumin: 3.9 g/dL (ref 3.5–5.0)
Alkaline Phosphatase: 69 U/L (ref 38–126)
Anion gap: 9 (ref 5–15)
BUN: 17 mg/dL (ref 8–23)
CO2: 25 mmol/L (ref 22–32)
Calcium: 8.6 mg/dL — ABNORMAL LOW (ref 8.9–10.3)
Chloride: 101 mmol/L (ref 98–111)
Creatinine, Ser: 1.24 mg/dL (ref 0.61–1.24)
GFR calc Af Amer: >60 mL/min (ref >60)
GFR calc non Af Amer: >60 mL/min (ref >60)
Glucose, Bld: 153 mg/dL — ABNORMAL HIGH (ref 70–99)
Potassium: 3.5 mmol/L (ref 3.5–5.1)
Sodium: 135 mmol/L (ref 135–145)
Total Bilirubin: 0.5 mg/dL (ref 0.3–1.2)
Total Protein: 7.2 g/dL (ref 6.5–8.1)

## 2018-12-03 LAB — CBC
HCT: 43.8 % (ref 39.0–52.0)
Hemoglobin: 14.6 g/dL (ref 13.0–17.0)
MCH: 29.2 pg (ref 26.0–34.0)
MCHC: 33.3 g/dL (ref 30.0–36.0)
MCV: 87.6 fL (ref 80.0–100.0)
Platelets: 233 10*3/uL (ref 150–400)
RBC: 5 MIL/uL (ref 4.22–5.81)
RDW: 12.6 % (ref 11.5–15.5)
WBC: 8.8 10*3/uL (ref 4.0–10.5)
nRBC: 0 % (ref 0.0–0.2)

## 2018-12-03 LAB — APTT: aPTT: 30 seconds (ref 24–36)

## 2018-12-03 LAB — PROTIME-INR
INR: 0.81
Prothrombin Time: 11.1 seconds — ABNORMAL LOW (ref 11.4–15.2)

## 2018-12-03 LAB — TROPONIN I: Troponin I: <0.03 ng/mL (ref <0.03)

## 2018-12-03 NOTE — ED Notes (Addendum)
As per patient noticed at 5pm  today he had right side facial drooping. Also can not blink right eye, feels numbness around lips. Awaiting mri r/o stroke vs bells palsy. nuero to consult.

## 2018-12-03 NOTE — ED Triage Notes (Addendum)
Pt arrives to ED via POV from home with c/o right-sided facial numbness since noon today with increasing numbness throughout the day. Pt denies any c/o numbness or weakness in his arms or legs, MAEW, grip is equal and strong bilaterally. Pt denies trouble speaking or understanding speech, mild right-sided facial droop is noticeable. Pt denies CP, no SHOB, no N/V/D or recent fever. Pt is A&O, in NAD; RR even, regular and unlabored.

## 2018-12-03 NOTE — ED Provider Notes (Addendum)
[facility] Emergency Department Provider Note   ____________________________________________   First MD Initiated Contact with Patient 12/03/18 2129     (approximate)  I have reviewed the triage vital signs and the nursing notes.   HISTORY  Chief Complaint Numbness   HPI Johnny Holland is a 67 y.o. male patient reports lip numbness starting around noon.  This progressed from the right side of the mouth to the whole lips and then he began having numbness on the right cheek and his right side of the mouth is not working right his right eyes not closing well either his forehead is working well though.  He had a headache last week but nothing now.  No other symptoms.  He did have a cold last week.   Past Medical History:  Diagnosis Date  . Aortic regurgitation 10/15/2012   Echocardiogram showing calcified aortic valves, mild AS, mod AR, preserved EF 55-65%, mild LV dilation and LVH (02/2017). rec rpt 1 yr.   Marland Kitchen CAD (coronary artery disease) 01/2017   4v by CT  . Carotid stenosis 01/12/2016   Minimal on [name] (01/2016) f/u PRN   . COPD (chronic obstructive pulmonary disease) ([place]) 01/2017   mild paraseptal and centrilobular by CT  . Diverticulitis   . Diverticulosis of colon   . Duodenal ulcer   . Hepatic steatosis 01/2017   by CT  . History of smoking   . HLD (hyperlipidemia)   . Right shoulder pain    impingement with partial RTC tear ([place])  . Thoracic aorta atherosclerosis ([place]) 01/2017   by CT    Patient Active Problem List   Diagnosis Date Noted  . Right hip pain 11/20/2017  . Severe obesity (BMI 35.0-35.9 with comorbidity) ([place]) 01/31/2017  . Prediabetes 01/31/2017  . Tinnitus 01/31/2017  . Erectile dysfunction 01/31/2017  . Thoracic aorta atherosclerosis ([place]) 01/19/2017  . CAD (coronary artery disease) 01/19/2017  . COPD (chronic obstructive pulmonary disease) ([place]) 01/19/2017  . Hepatic steatosis 01/19/2017  . Carotid stenosis  01/12/2016  . Aortic regurgitation 10/15/2012  . Tendinitis of right rotator cuff 10/09/2012  . Healthcare maintenance 10/13/2011  . HLD (hyperlipidemia) 10/05/2011  . MOTION SICKNESS 07/26/2010  . Personal history of tobacco use, presenting hazards to health 11/07/2007  . DIVERTICULOSIS, COLON 11/07/2007  . PEPTIC ULCER DISEASE, HX OF 11/07/2007    Past Surgical History:  Procedure Laterality Date  . COLONOSCOPY  11/30/06   diverticulosis; no polyps  . INCISE AND DRAIN ABCESS     Hemmorhoid  . VASECTOMY     x 2    Prior to Admission medications   Medication Sig Start Date End Date Taking? Authorizing Provider  albuterol (PROVENTIL HFA;VENTOLIN HFA) 108 (90 Base) MCG/ACT inhaler Inhale 2 puffs into the lungs every 6 (six) hours as needed for wheezing or shortness of breath. 11/21/18   Jearld Fenton, NP  aspirin 81 MG tablet Take 1 tablet (81 mg total) by mouth daily. 02/27/18   Ria Bush, MD  Multiple Vitamin (MULTI-VITAMIN DAILY PO) Take by mouth.    [provider]  pravastatin (PRAVACHOL) 80 MG tablet Take 1 tablet (80 mg total) by mouth at bedtime. 02/27/18   Ria Bush, MD  predniSONE (DELTASONE) 10 MG tablet Take 1 tablet (10 mg total) by mouth daily. Take 6 pills every day for 6 days then Take 4 pills a day for 2 days then Take 2 pills a day for 2 days then Take 1 pill a day  for 2 days then stop 12/04/18   Nena Polio, MD  scopolamine (TRANSDERM-SCOP, 1.5 MG,) 1 MG/3DAYS Place 1 patch (1.5 mg total) onto the skin every 3 (three) days. 07/31/17   Ria Bush, MD  sildenafil (VIAGRA) 100 MG tablet TAKE 0.5-1 TABLETS (50-100 MG TOTAL) BY MOUTH DAILY AS NEEDED FOR ERECTILE DYSFUNCTION. 07/11/18   Ria Bush, MD  valACYclovir (VALTREX) 1000 MG tablet Take 1 tablet (1,000 mg total) by mouth daily for 6 days. 12/04/18 12/10/18  Nena Polio, MD    Allergies Erythromycin  Family History  Problem Relation Age of Onset  . Cirrhosis Father         + alcohol  . Heart failure Father   . Emphysema Father        + smoker  . Heart attack Father 34       x 4  . Alcohol abuse Father   . COPD Mother        + smoker  . Macular degeneration Mother   . Cataracts Mother        Bilateral--removed  . Diabetes Paternal Grandmother   . Breast cancer Maternal Aunt   . Depression Sister   . Alcohol abuse Sister   . Alzheimer's disease Other        Aunt    Social History Social History   Tobacco Use  . Smoking status: Former Smoker    Packs/day: 1.00    Years: 42.00    Pack years: 42.00    Last attempt to quit: 08/13/2011    Years since quitting: 7.3  . Smokeless tobacco: Never Used  . Tobacco comment: Using electronic cigarettes now  Substance Use Topics  . Alcohol use: Yes    Comment: Social  . Drug use: No    Review of Systems  Constitutional: No fever/chills Eyes: No visual changes. ENT: No sore throat. Cardiovascular: Denies chest pain. Respiratory: Denies shortness of breath. Gastrointestinal: No abdominal pain.  No nausea, no vomiting.  No diarrhea.  No constipation. Genitourinary: Negative for dysuria. Musculoskeletal: Negative for back pain. Skin: Negative for rash. Neurological: See HPI ____________________________________________   PHYSICAL EXAM:  VITAL SIGNS: ED Triage Vitals  Enc Vitals Group     BP 12/03/18 1926 (!) 140/58     Pulse Rate 12/03/18 1926 (!) 57     Resp 12/03/18 1926 16     Temp 12/03/18 1926 98.7 F (37.1 C)     Temp Source 12/03/18 1926 Oral     SpO2 12/03/18 1926 95 %     Weight 12/03/18 1921 230 lb (104.3 kg)     Height 12/03/18 1921 5\' 8"  (1.727 m)     Head Circumference --      Peak Flow --      Pain Score 12/03/18 1921 0     Pain Loc --      Pain Edu? --      Excl. in [place]? --     Constitutional: Alert and oriented. Well appearing and in no acute distress. Eyes: Conjunctivae are normal. PER. EOMI. right eye not closing as well Head: Atraumatic. Nose: No  congestion/rhinnorhea. Mouth/Throat: Mucous membranes are moist.  Oropharynx non-erythematous. Neck: No stridor.  Cardiovascular: Normal rate, regular rhythm. Grossly normal heart sounds.  Good peripheral circulation. Respiratory: Normal respiratory effort.  No retractions. Lungs CTAB. Gastrointestinal: Soft and nontender. No distention. No abdominal bruits. No CVA tenderness. Musculoskeletal: No lower extremity tenderness nor edema.   Neurologic:  Normal speech and language.  There is some numbness on the right cheek and the whole lips.  Right side of the face is not working well with the exception of the forehead which is working normally bilaterally.  No tongue deviation t otherwise cranial nerves are working well. Skin:  Skin is warm, dry and intact. No rash noted. Psychiatric: Mood and affect are normal. Speech and behavior are normal.  ____________________________________________   LABS (all labs ordered are listed, but only abnormal results are displayed)  Labs Reviewed  PROTIME-INR - Abnormal; Notable for the following components:      Result Value   Prothrombin Time 11.1 (*)    All other components within normal limits  COMPREHENSIVE METABOLIC PANEL - Abnormal; Notable for the following components:   Glucose, Bld 153 (*)    Calcium 8.6 (*)    All other components within normal limits  APTT  CBC  DIFFERENTIAL  TROPONIN I  CBG MONITORING, ED   ____________________________________________  EKG EKG read and interpreted by me shows sinus bradycardia rate of 58 leftward axis no acute changes  ____________________________________________  RADIOLOGY  ED MD interpretation: MRI read by radiology shows only a right cerebellar stroke.  Official radiology report(s): Ct Head Wo Contrast  Result Date: 12/03/2018 CLINICAL DATA:  Pt arrives to ED via POV from home with c/o right-sided facial numbness since noon today with increasing numbness throughout the day. Pt denies any c/o  numbness or weakness in his arms, MAEW, grip is equal and strong bilaterally.*comment was truncated*TIA, initial exam EXAM: CT HEAD WITHOUT CONTRAST TECHNIQUE: Contiguous axial images were obtained from the base of the skull through the vertex without intravenous contrast. COMPARISON:  None. FINDINGS: Brain: No acute intracranial hemorrhage. No focal mass lesion. No CT evidence of acute infarction. No midline shift or mass effect. No hydrocephalus. Basilar cisterns are patent. Vascular: No hyperdense vessel or unexpected calcification. Skull: Normal. Negative for fracture or focal lesion. Sinuses/Orbits: Paranasal sinuses and mastoid air cells are clear. Orbits are clear. Other: None. IMPRESSION: Acute intracranial findings.  Normal head CT for age Electronically Signed   By: Suzy Bouchard M.D.   On: 12/03/2018 20:03   Mr Brain Wo Contrast  Result Date: 12/04/2018 CLINICAL DATA:  RIGHT facial droop and numbness. Assess for stroke versus Bell's palsy. History of hyperlipidemia. EXAM: MRI HEAD WITHOUT CONTRAST TECHNIQUE: Multiplanar, multiecho pulse sequences of the brain and surrounding structures were obtained without intravenous contrast. COMPARISON:  CT HEAD December 03, 2018 FINDINGS: INTRACRANIAL CONTENTS: No reduced diffusion to suggest acute ischemia. No susceptibility artifact to suggest hemorrhage. The ventricles and sulci are normal for patient's age. A few punctate supratentorial white matter FLAIR T2 hyperintensities. Old small RIGHT cerebellar infarct. No suspicious parenchymal signal, masses, mass effect. No abnormal extra-axial fluid collections. No extra-axial masses. VASCULAR: Normal major intracranial vascular flow voids present at skull base. SKULL AND UPPER CERVICAL SPINE: No abnormal sellar expansion. No suspicious calvarial bone marrow signal. Craniocervical junction maintained. SINUSES/ORBITS: Small bilateral maxillary sinus air-fluid levels.The included ocular globes and orbital  contents are non-suspicious. OTHER: None. IMPRESSION: 1. No acute intracranial process. 2. Mild chronic small vessel ischemic changes. Old small RIGHT cerebellar infarct. Electronically Signed   By: Elon Alas M.D.   On: 12/04/2018 00:57    ____________________________________________   PROCEDURES  Procedure(s) performed:   Procedures  Critical Care performed:   ____________________________________________   INITIAL IMPRESSION / ASSESSMENT AND PLAN / ED COURSE  Tele-neurology feels this is difficult to tell if it is Bell's palsy due  to the numbness if the MRI is negative go ahead and treat him for Bell's palsy.  MRI is negative we will therefore go ahead and treat him with Bell's palsy with prednisone and valacyclovir as was suggested by neurology.     Clinical Course as of Dec 04 112  Mon Dec 03, 2018  2358 Basophil: 1 [PM]    Clinical Course User Index [PM] Nena Polio, MD     ____________________________________________   FINAL CLINICAL IMPRESSION(S) / ED DIAGNOSES  Final diagnoses:  Bell's palsy     ED Discharge Orders         Ordered    predniSONE (DELTASONE) 10 MG tablet  Daily     12/04/18 0113    valACYclovir (VALTREX) 1000 MG tablet  Daily     12/04/18 0113           Note:  This document was prepared using Dragon voice recognition software and may include unintentional dictation errors.    Nena Polio, MD 12/04/18 2706    Nena Polio, MD 12/17/18 [phone]

## 2018-12-03 NOTE — ED Notes (Signed)
Tele stroke machine at patients bedside. Patient having tele consult NIH stroke scale 0 as per neurologist. Possible bells palsy.

## 2018-12-03 NOTE — Consult Note (Signed)
   [facility] Tele[facility]urology Consult Services   Impression:  Most likely Bell's palsy, has discomfort as well beind his right ear, but given has numbness and decrease sensation on exam, agree with MRI to r/o stroke.    Not a tpa candidate due to: LKN > 4.5 hours Does not meet LVO screening criteria (no aphasia, neglect, gaze deviation, dense hemiparesis, or visual field deficits on exam), therefore advanced imaging is not indicated.   Comments:  STAT  Recommendations:  - MRI brain ordered, if its neg for stroke then this confirms Bell's and ok to send home with neuro clinic follow up. Steroid and antiviral coarse. Eye patch and artificial tears. If stroke then need admission for stroke work up and ASA   Discussed with ED MD  Please call with questions   -----------------------------------------------------------------------------------------   CC Right face weakness  History of Present Illness   Patient is a 67 year old man who presented with right facial weakness. He said he noted he did bite his lip at lunch around noon today. Then around 5 pm he noted in mirror his face is weak on the right side. Also has numbness in his right side of his face.   Diagnostic:  CT head NAF  Exam:  Patient is in no apparent distress. Patient appears as stated age. Patient is well groomed and well-nourished.   NIHSS score:   NIH Stroke Scale: Level of consciousness ( Alert = 0 ), Ask patient current month and age ( Answers both correctly = 0 ), Ask patient to open and close eyes ( Obeys both correctly = 0 ), Best gaze ( Normal = 0 ), Visual field testing ( No visual field loss = 0 ), Facial paresis 2 ( Normal symmetric movement = 0 ), Motor function left arm ( Normal = 0 ), Motor function right arm ( Normal = 0 ), Motor function left leg ( Normal = 0 ), Motor function right leg ( Normal = 0 ), Limb ataxia ( No ataxia = 0 ), Sensory 1 ( Normal = 0 ), Best language ( No aphasia = 0 ), Dysarthria (  Normal articulation = 0 ), Extinction and inattention ( Normal = 0 ), Total score  3.     Medical Decision Making:  - Extensive number of diagnosis or management options are considered above.  - Extensive amount of complex data reviewed.  - High risk of complication and/or morbidity or mortality are associated with differential diagnostic considerations above.  - There may be Uncertain outcome and increased probability of prolonged functional impairment or high probability of severe prolonged functional impairment associated with some of these differential diagnosis.  Medical Data Reviewed:  1.Data reviewed include clinical labs, radiology,Medical Tests;  2.Tests results discussed w/performing or interpreting physician;  3.Obtaining/reviewing old medical records;  4.Obtaining case history from another source;  5.Independent review of image, tracing or specimen.   Patient was informed the Neurology Consult would happen via telehealth (remote video) and consented to receiving care in this manner.

## 2018-12-04 ENCOUNTER — Telehealth: Payer: Self-pay

## 2018-12-04 MED ORDER — VALACYCLOVIR HCL 1 G PO TABS: 1000.0000 mg | ORAL_TABLET | Freq: Every day | ORAL | 0 refills | Status: DC

## 2018-12-04 MED ORDER — PREDNISONE 20 MG PO TABS
60.0000 mg | ORAL_TABLET | Freq: Once | ORAL | Status: AC
  Administered 2018-12-04: 60 mg via ORAL
  Filled 2018-12-04: qty 3

## 2018-12-04 MED ORDER — PREDNISONE 10 MG PO TABS: 10.0000 mg | ORAL_TABLET | Freq: Every day | ORAL | 0 refills | Status: DC

## 2018-12-04 MED ORDER — VALACYCLOVIR HCL 500 MG PO TABS
1000.0000 mg | ORAL_TABLET | Freq: Once | ORAL | Status: AC
  Administered 2018-12-04: 1000 mg via ORAL
  Filled 2018-12-04: qty 2

## 2018-12-04 MED ORDER — VALACYCLOVIR HCL 1 G PO TABS: 1000.0000 mg | ORAL_TABLET | Freq: Every day | ORAL | 0 refills | Status: AC

## 2018-12-04 NOTE — Discharge Instructions (Signed)
Please follow-up with Dr. George Ina the eye doctor.  Call his office in the morning let him know you are in ER with Bell's palsy and having trouble closing your eye.  He should be out of see you quickly.  In the meantime use the paper tape to keep your eye taped shut at night and put artificial tears in your eye 6-10 times a day during the day.  Take the prednisone as directed.  That should help keep you from having a complete palsy.  Also take the valacyclovir once a day for 6 days.  Please return for worsening or any new symptoms.  Follow-up with your primary care doctor in the next few days as well.

## 2018-12-04 NOTE — Telephone Encounter (Signed)
[facility] faxed note; pts face is droopy and numbness on rt side of face. Per chart review tab pt went to [facility] ED on 12/03/18 and dx with bells palsy. Pt to FU with Dr Birder Robson, ophthalmologist. Copy of [place] note in Dr Gutierrez's in box.

## 2018-12-05 ENCOUNTER — Encounter: Payer: Self-pay | Admitting: Family Medicine

## 2018-12-05 DIAGNOSIS — G510 Bell's palsy: Secondary | ICD-10-CM

## 2018-12-06 ENCOUNTER — Encounter: Payer: Self-pay | Admitting: Family Medicine

## 2018-12-06 DIAGNOSIS — G510 Bell's palsy: Secondary | ICD-10-CM

## 2018-12-06 HISTORY — DX: Bell's palsy: G51.0

## 2018-12-10 ENCOUNTER — Telehealth (HOSPITAL_BASED_OUTPATIENT_CLINIC_OR_DEPARTMENT_OTHER): Payer: Self-pay | Admitting: Urology

## 2018-12-10 DIAGNOSIS — N320 Bladder-neck obstruction: Principal | ICD-10-CM

## 2018-12-10 NOTE — Telephone Encounter (Signed)
Spoke with pt using two pt identifiers.    States last week he experienced severe abdominal pain and nausea. States on 12/07/18 he went to [facility]  and was advised of and "incidental finding" on the MRI and CT that should be address with urology. Pt states he still has some pain and a little nausea now but pain is controlled and nausea has improved with medications provided at ED. Denies any new symptoms. ED precautions advised.    Pt states he has been speaking with nurse Haynes Dage in regards to this and wanted to inform her and Dr. Lucia Bitter. States he was given a copy of imaging reports, labs, and the imaging disc and will bring them into clinic this week for review.    Will route to provider and provider's nurse.

## 2018-12-10 NOTE — Telephone Encounter (Signed)
Pt is calling in regards to requesting a call back from [facility]. Pt states he is experiencing symptoms. Pt states he has severe abdominal pain and would like to be seen this week. Pt states something is wrong with his kidney.    Best call back number  [phone] (H)

## 2018-12-10 NOTE — Telephone Encounter (Signed)
Called pt, no answer, left message for pt to call.

## 2018-12-13 ENCOUNTER — Other Ambulatory Visit: Payer: Self-pay

## 2018-12-13 ENCOUNTER — Telehealth (HOSPITAL_BASED_OUTPATIENT_CLINIC_OR_DEPARTMENT_OTHER): Payer: Self-pay | Admitting: Anesthesiology

## 2018-12-13 NOTE — Telephone Encounter (Signed)
Called pt X2. No response. We will attempt tomorrow.

## 2018-12-13 NOTE — Telephone Encounter (Signed)
Pt came into clinic to inform Dr. [name] that he is having severe pain in his lower back and in the back of his thighs, especially in the morning. Pt stated that he has problems walking. Please advise. Thank you.

## 2018-12-14 ENCOUNTER — Encounter (HOSPITAL_BASED_OUTPATIENT_CLINIC_OR_DEPARTMENT_OTHER): Payer: Self-pay | Admitting: Hospital

## 2018-12-14 ENCOUNTER — Other Ambulatory Visit: Payer: Self-pay

## 2018-12-14 NOTE — Telephone Encounter (Signed)
My Chart message sent

## 2018-12-14 NOTE — Telephone Encounter (Signed)
Pt has been scheduled with PA on 12/20/18 with PA.

## 2018-12-14 NOTE — Telephone Encounter (Signed)
Called pt. No answer will send Mychart message    Reesa Chew, LVN Float

## 2018-12-20 ENCOUNTER — Encounter (HOSPITAL_BASED_OUTPATIENT_CLINIC_OR_DEPARTMENT_OTHER): Payer: Self-pay | Admitting: Physician Assistant

## 2018-12-20 ENCOUNTER — Ambulatory Visit: Payer: Medicare Other | Attending: Physician Assistant | Admitting: Physician Assistant

## 2018-12-20 VITALS — BP 129/75 | HR 56 | Temp 97.8°F | Resp 16 | Ht 68.0 in | Wt 210.8 lb

## 2018-12-20 DIAGNOSIS — G8929 Other chronic pain: Secondary | ICD-10-CM | POA: Insufficient documentation

## 2018-12-20 DIAGNOSIS — M533 Sacrococcygeal disorders, not elsewhere classified: Secondary | ICD-10-CM | POA: Insufficient documentation

## 2018-12-20 DIAGNOSIS — M5410 Radiculopathy, site unspecified: Secondary | ICD-10-CM | POA: Insufficient documentation

## 2018-12-20 DIAGNOSIS — M5416 Radiculopathy, lumbar region: Secondary | ICD-10-CM

## 2018-12-20 NOTE — Progress Notes (Signed)
PAIN CLINIC FOLLOW UP NOTE    Primary Care Physician [name]    Chief Complaint: Back Pain      SUBJECTIVE:  This is a 68 year old male with chronic neck and back pain who presents with ongoing low back pain and new onset of posterior thigh pain.   He states that burning pain involving posterior aspect of bilateral thighs began a couple of weeks ago. At times, feeling is so severe that he feels he cannot walk.  He also has intermittent pain in left gluteal pain.     He states that recently his kidneys are being worked up. He states that there may be a "neoplasm."   MRI Abdomen was recently performed.    He is resistant to PT since in the past it made his pain worse.     On the pain diagram today the patient shades in the areas of their neck and low back.     Since the last visit in Pain Clinic patient reports minimal improvement in their pain from pain procedures.  Patient reports 30% improvement in their pain with use of their current pain medications.  The patient is doing a home exercise program: walking    They describe their pain as pressure, burning, stinging and squeezing. Patient states their pain is associated with weakness, coldness and tightness. This pain has made it hard for the patient to walk, sleep, sit and exercise.    The patient denies saddle anesthesia or new onset bowel or bladder incontinence.    The patient stated their pain today is Pain Score: 10/10.   Over the past week the patient's pain has been at its worst 10/10, at best 10/10 and averages 10/10.   During the past week, it has interfered with enjoyment of life 10/10 and general activity 10/10.    Pain Treatments  Previous pain procedures, dates, and response include:  RFA Left 11/26/18 did not provide much relief   Left L3-5 MBB 06/26/2018  Cervical ESI 01/23/2018   Lumbar ESI 11/24/2018      Past Medical History:   Diagnosis Date   . Chronic suprapubic catheter (CMS-HCC)    . Congenital hydronephrosis    . Gout    . Headache    .  Hematuria    . Kidney disease    . Kidney stones    . Major depressive disorder, single episode    . Polyarthropathy or polyarthritis of multiple sites    . Urethral stricture      Past Surgical History:   Procedure Laterality Date   . CT INSERTION OF SUPRAPUBIC CATH  09/25/2015   . NEPHRECTOMY Right 1995   . APPENDECTOMY     . CYSTOSCOPY     . CYSTOSCOPY W/ LASER LITHOTRIPSY     . OTHER SURGICAL HISTORY      Interstim 01/29/2011     Current Outpatient Medications   Medication Sig Dispense Refill   . allopurinol (ZYLOPRIM) 300 MG tablet Take 300 mg by mouth daily.     Marland Kitchen amLODIPINE (NORVASC) 5 MG tablet Take 10 mg by mouth daily.     Marland Kitchen atenolol (TENORMIN) 25 MG tablet Take 25 mg by mouth daily.     Marland Kitchen docusate sodium (COLACE) 250 MG capsule Take 1 capsule by mouth twice daily while taking narcotic and as needed for constipation. 60 capsule 0   . ibuprofen (MOTRIN) 200 MG tablet Take 200 mg by mouth every 6 hours as needed for  Mild Pain (Pain Score 1-3).     . Ibuprofen-Diphenhydramine Cit (ADVIL PM PO) as needed (1 qhs).     . meloxicam (MOBIC) 7.5 MG tablet       . phenazopyridine (PYRIDIUM) 100 MG tablet Take 1 tablet (100 mg) by mouth 3 times daily. 20 tablet 0   . tizanidine (ZANAFLEX) 4 MG capsule Take 4 mg by mouth 3 times daily.     . traMADol (ULTRAM) 50 MG tablet Take 1-2 tablets by mouth every 6 hours as needed for severe pain. 10 tablet 0     No current facility-administered medications for this visit.      Allergies   Allergen Reactions   . Sulfa Drugs Unspecified     Social History     Socioeconomic History   . Marital status: Single     Spouse name: Not on file   . Number of children: Not on file   . Years of education: Not on file   . Highest education level: Not on file   Occupational History   . Not on file   Social Needs   . Financial resource strain: Not on file   . Food insecurity:     Worry: Not on file     Inability: Not on file   . Transportation needs:     Medical: Not on file     Non-medical:  Not on file   Tobacco Use   . Smoking status: Never Smoker   . Smokeless tobacco: Never Used   Substance and Sexual Activity   . Alcohol use: Not Currently   . Drug use: Not on file   . Sexual activity: Never   Lifestyle   . Physical activity:     Days per week: Not on file     Minutes per session: Not on file   . Stress: Not on file   Relationships   . Social connections:     Talks on phone: Not on file     Gets together: Not on file     Attends religious service: Not on file     Active member of club or organization: Not on file     Attends meetings of clubs or organizations: Not on file     Relationship status: Not on file   . Intimate partner violence:     Fear of current or ex partner: Not on file     Emotionally abused: Not on file     Physically abused: Not on file     Forced sexual activity: Not on file   Other Topics Concern   . Not on file   Social History Narrative   . Not on file     Family History   Adopted: Yes   Family history unknown: Yes       Review of Systems  Constitutional: Unusual fatigue  Eyes: Glasses/Contacts  ENT: Negative  Cardiac: Negative  Pulmonary: Negative  Gastrointestional: Negative  Musculoskeletal: Muscle weakness;Lower back pain;Pain in joints  Skin: Negative  Neurologic: Negative  Psychiatric: Difficulty Sleeping  Endocrine: Negative  Blood Disease: Negative  Allergy: Negative  OB/Gyn: Negative  Remainder of complete ROS is negative except as above and scanned under Media.    Physical Exam:   Vitals: BP 129/75 (BP Location: Left arm, BP Patient Position: Sitting, BP cuff size: Large)   Pulse 56   Temp 97.8 F (36.6 C) (Oral)   Resp 16   Ht 5\' 8"  (1.727 m)  Wt 95.6 kg (210 lb 12.2 oz)   SpO2 97%   BMI 32.05 kg/m   Constitutional: Vital signs listed above. Well-developed, well-nourished, and alert, no distress  Psych: alert and oriented. Speech is pressured. Affect is anxious  Eyes: Sclera white, conjunctiva clear, lids are without lag. Pupils equal, not pinpoint.  ENT:  Oropharynx clear and moist without erythema  CV:  Skin warm and dry. No lower extremity edema.  Respiratory:  Breathing easily without tachypnea or bradypnea. Not using accessory muscles.  GI/Abdomen: non-distended.  Skin: Skin color, texture, turgor normal. No rashes or lesions.  Musculoskeletal:    L-Spine    Facet Loading:  Right positive; Left positive  Straight Leg Raise: Right negative; Left negative  Lumbar taut, tender bands consistent with active trigger points: none     Sacroiliac Joint   Fortin Finger Test: Right positive; Left positive  .Tenderness with palpation of SI Joint bilaterally    Buttock   Buttock taut, tender bands consistent with active trigger points: Bilateral, piriformis    Neurological:  Mental Status; Awake, alert, oriented  Motor: Normal bulk and tone.                                      Left Right     Iliopsoas:  5/5 5/5  Quadriceps:  5/5 5/5  Hamstrings:    5/5 5/5  Ankle Dorsiflexion:   5/5 5/5  Ankle Plantarflexion:  5/5 5/5    Gait: Pt is able to raise from a seated position without difficulty. Gait  is not antalgic and the patient ambulates without assistance.   Labs and Imaging:    MRI Abdomen      Assessment and Plan:  Encounter Diagnoses   Name Primary?   . Radicular pain Yes   . Chronic SI joint pain        This is a 68 year old male  chronic neck and back pain who presents with ongoing low back pain and new onset of posterior thigh pain.   MRI Lumbar Spine has been ordered to further evaluate his complaints. Based on examination, Low back pain appears to be consistent with SI Joint. We have discussed recommendations for SI Joint Injection.   Additionally, we strongly advised on a course of PT to work on stretching techniques and strengthening. Patient is resistant to PT since he did not benefit in the past.     Interventional Pain Procedures: Bilateral SI Joint INjection    Regarding the above interventions, the patient has been educated regarding the risks (including  bleeding, infection, increased pain, nerve damage, or allergic reaction), benefits, and alternatives. The patient states he/she understands and is eager to proceed.    MRI Lumbar Spine has been ordered to further evalaute posterior thigh pain      Recommed PT but patient not willing to do since he states it did not help in the past    Follow-up: after above     Dr. Noland Fordyce was present in the clinic as the supervising physician and was immediately available.

## 2018-12-21 ENCOUNTER — Encounter (HOSPITAL_BASED_OUTPATIENT_CLINIC_OR_DEPARTMENT_OTHER): Payer: Self-pay | Admitting: Physician Assistant

## 2018-12-21 NOTE — Telephone Encounter (Signed)
Referral an medical records were received via fax, please see under Media    Advise if Patient should be scheduled with Dr Lucia Bitter or suggest Provider

## 2018-12-21 NOTE — Telephone Encounter (Signed)
Routed to scheduler for f/u w Dr. Lucia Bitter per notes:  PLAN:     - We will plan to repeat his cystoscopy in 3 months to re-evaluate the [facility]. He was able to void today. His SPT was clamped. If he is unable to void, he will unclamp his SPT.

## 2018-12-24 ENCOUNTER — Encounter: Payer: Self-pay | Admitting: Family Medicine

## 2018-12-24 ENCOUNTER — Telehealth (HOSPITAL_BASED_OUTPATIENT_CLINIC_OR_DEPARTMENT_OTHER): Payer: Self-pay | Admitting: Physician Assistant

## 2018-12-24 DIAGNOSIS — F40240 Claustrophobia: Secondary | ICD-10-CM

## 2018-12-24 MED ORDER — LORAZEPAM 1 MG OR TABS
1.00 mg | ORAL_TABLET | Freq: Once | ORAL | 0 refills | Status: AC
Start: 2018-12-24 — End: 2018-12-24

## 2018-12-24 NOTE — Telephone Encounter (Signed)
Per pt he is scheduled for lumbar spine MRI on 01/09/19 and he wants to know if MD/PA can assist on helping him get a sooner appt with Radiology. Pt is also requesting a medication (Ativan) for claustrophobic for MRI.       [id number] - [place], [place]

## 2018-12-24 NOTE — Telephone Encounter (Signed)
Pt called per previous messages. I offered to schedule CYSTO as mention below, but pt is requesting to speak with nurse in regards to MRI uploaded to epic. Pt will like to know results. Please advise pt.    [phone] ok LVM      If Cysto is to be scheduled please place order and referral for CYSTO.

## 2018-12-26 ENCOUNTER — Other Ambulatory Visit: Payer: Self-pay

## 2018-12-26 NOTE — Telephone Encounter (Signed)
Called patient and informed him.     Boone Master, DO  You; Lars Pinks, PA 2 days ago      Please advise patient that one time dose of At[facility]. Unfortunately, regarding scheduling, we do not have any influence. Thanks,     [employer]

## 2018-12-27 ENCOUNTER — Telehealth (HOSPITAL_BASED_OUTPATIENT_CLINIC_OR_DEPARTMENT_OTHER): Payer: Self-pay | Admitting: Urology

## 2018-12-27 NOTE — Telephone Encounter (Signed)
Per MD note:  Repeat cystoscopy in 4 months- stable Gordon and transferred to scheduling line for cysto appt.

## 2018-12-27 NOTE — Telephone Encounter (Signed)
Routing encounter to Dr Lucia Bitter and Team to review POC for pt.

## 2018-12-27 NOTE — Telephone Encounter (Signed)
Incoming call from pt navigator, wanted to clarify it was okay to reschedule pt for cysto, advised to reschedule for cysto with Dr. Maudie Mercury. Pt scheduled for cysto with Dr. Lucia Bitter on 01/01/19, will route to see if this is appropriate.    Stephen Ram, MD  Smitty Cords, RN; Jolaine Artist, MD; Tera Mater E 2 days ago      Since he needs a cystoscopy appointment, please cancel his clinic appointment on 12/28/2018 and switch to cysto.    Routing comment

## 2018-12-28 ENCOUNTER — Other Ambulatory Visit: Payer: Self-pay

## 2018-12-28 ENCOUNTER — Encounter (HOSPITAL_BASED_OUTPATIENT_CLINIC_OR_DEPARTMENT_OTHER): Payer: Self-pay | Admitting: Urology

## 2018-12-28 NOTE — Telephone Encounter (Signed)
Lowry Ram, MD  You 13 hours ago (5:34 PM)      Yes, thank you    Routing comment

## 2018-12-28 NOTE — Telephone Encounter (Signed)
Per Dr. Maudie Mercury, pt to have cysto at 1/14 app't

## 2018-12-31 NOTE — Progress Notes (Signed)
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 Lucia Bitter, was seen recently in ED 06/24/2018 due to hematuria, clot retention, hand irrigated through SPT.    Current symptoms: No hematuria, no UTI, no weight loss, no kidney pain    Medications:  Current Outpatient Medications on File Prior to Visit   Medication Sig Dispense Refill   . allopurinol (ZYLOPRIM) 300 MG tablet Take 300 mg by mouth daily.     Marland Kitchen amLODIPINE (NORVASC) 5 MG tablet Take 10 mg by mouth daily.     Marland Kitchen atenolol (TENORMIN) 25 MG tablet Take 25 mg by mouth daily.     Marland Kitchen docusate sodium (COLACE) 250 MG capsule Take 1 capsule by mouth twice daily while taking narcotic and as needed for constipation. 60 capsule 0   . ibuprofen (MOTRIN) 200 MG tablet Take 200 mg by mouth every 6 hours as needed for Mild Pain (Pain Score 1-3).     . Ibuprofen-Diphenhydramine Cit (ADVIL PM PO) as needed (1 qhs).     . meloxicam (MOBIC) 7.5 MG tablet       . phenazopyridine (PYRIDIUM) 100 MG tablet Take 1 tablet (100 mg) by mouth 3 times daily. 20 tablet 0   . tizanidine (ZANAFLEX) 4 MG capsule Take 4 mg by mouth 3 times daily.     . traMADol (ULTRAM) 50 MG tablet Take 1-2 tablets by mouth every 6 hours as needed for severe pain. 10 tablet 0     No current facility-administered medications on file prior to visit.        Review of systems: no flu like sxs         OBJECTIVE:  Vital Signs:  There were no vitals taken for this visit.    Physical:    General: No acute distress, pleasant  Neuro:  Alert, oriented  Head:  Normocephalic, atraumatic  Neck:  Midline trachea, supple  Chest:  Nonlabored, symmetric  CV:  Regular rate, rhythm  GI:  Soft, nontender, nondistended  GU:  No CVA tenderness, no suprapubic  tenderness  Skin:  No obvious rashes, no lesions  Psych:  Appropriate mood    Labs:  Lab Results   Component Value Date    WBC 8.9 06/24/2018    RBC 4.77 06/24/2018    HGB 14.5 06/24/2018    HCT 43.7 06/24/2018    MCV 91.6 06/24/2018    MCHC 33.2 06/24/2018    RDW 14.4 (H) 06/24/2018    PLT 205 06/24/2018    MPV 10.0 06/24/2018       Lab Results   Component Value Date    NA 142 06/24/2018    K 3.8 06/24/2018    CL 105 06/24/2018    BICARB 24 06/24/2018    BUN 21 06/24/2018    CREAT 1.19 (H) 06/24/2018    GLU 144 (H) 06/24/2018    Bridgeton 9.3 06/24/2018       Lab Results   Component Value Date    COLORUA Red (A) 06/24/2018    APPEARUA Bloody (A) 06/24/2018    GLUCOSEUA 1+ (A) 06/24/2018    BILIUA Negative 06/24/2018    KETONEUA 1+ (A) 06/24/2018  SGUA 1.017 06/24/2018    BLOODUA 2+ (A) 06/24/2018    PHUA 6.0 06/24/2018    PROTEINUA 2+ (A) 06/24/2018    UROBILUA Negative 06/24/2018    NITRITEUA Negative 06/24/2018    LEUKESTUA Negative 06/24/2018    WBCUA 0-2 06/24/2018    RBCUA >50 (A) 06/24/2018       Lab Results   Component Value Date    INR 1.0 06/24/2018    PTT 32 06/24/2018         Creatinine   Date/Time Value Ref Range Status   06/24/2018 06:27 AM 1.19 (H) 0.67 - 1.17 mg/dL Final   03/03/2018 08:16 PM 1.63 (H) 0.67 - 1.17 mg/dL Final   02/28/2017 11:00 AM 1.16 0.67 - 1.17 mg/dL Final       No results found for: PSA    Cystoscopy 07/31/2018   Trabeculation: Moderate-Severe   Stones: None   Diverticuli: Yes   Open BN- able to take the scope without resistance. SPT seen in the bladder    Flow study 07/31/2018  FR=13cc/sec  VOL=288cc  PVR=5cc  Curve= no sign of obstruction    Cystoscopy 01/01/2019    The patient was prepped and draped in the usual sterile fashion. Lidocaine gel was injected into the urethra. A 81fr flexible cystoscope was introduced into the meatus.    Findings:  Meatus:   Normal    Urethra   Anterior: Normal   Membranous: Normal   Prostatic: Patent  repair    Bladder   Mucosa: Normal   Trabeculation: severe   Stones: None   Diverticuli:        yes    Patient tolerated the procedure without difficulty. Findings were discussed with the patient.    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 The area is wide open.     I personally reviewed the patient's history, and interviewed and examined the patient. I agree with the documentation completed.    We will have his MRI reviewed by our radiologist.     Check Urine cytology.      Sharyne Peach Lucia Bitter, MD FACS

## 2019-01-01 ENCOUNTER — Other Ambulatory Visit: Payer: Self-pay | Admitting: Family Medicine

## 2019-01-01 ENCOUNTER — Ambulatory Visit: Payer: Medicare Other | Attending: Urology | Admitting: Urology

## 2019-01-01 VITALS — BP 126/66 | HR 57 | Temp 98.4°F | Resp 17 | Ht 68.0 in | Wt 220.7 lb

## 2019-01-01 DIAGNOSIS — N320 Bladder-neck obstruction: Secondary | ICD-10-CM | POA: Insufficient documentation

## 2019-01-01 DIAGNOSIS — R339 Retention of urine, unspecified: Principal | ICD-10-CM | POA: Insufficient documentation

## 2019-01-01 MED ORDER — LIDOCAINE HCL 2% EX GEL (UROJET)
10.00 mL | Freq: Once | Status: AC
Start: 2019-01-01 — End: 2019-01-01
  Administered 2019-01-01: 10 mL via URETHRAL

## 2019-01-01 MED ORDER — PRAVASTATIN SODIUM 80 MG PO TABS: 80.0000 mg | ORAL_TABLET | Freq: Every day | ORAL | 0 refills | Status: DC

## 2019-01-01 NOTE — Telephone Encounter (Signed)
Pt's wife Diane (on dpr) called office stating her husband was confused and that he needs all medications sent to [facility]. Pt's wife is requesting a call from Dr.G's CMA. Best # [phone]

## 2019-01-01 NOTE — Telephone Encounter (Signed)
Pt called office stating he will now use [facility] at [facility]. He needs all refills sent to this pharmacy from here on out. Pt is also requesting a refill on the Pravastatin as well.

## 2019-01-01 NOTE — Addendum Note (Signed)
Addended by: Brenton Grills on: 8/86/4847 20:72 PM   Modules accepted: Orders

## 2019-01-01 NOTE — Telephone Encounter (Addendum)
Viagra Last rx:  07/11/18, #5/3 Last OV:  11/21/18, acute Next OV: 03/01/19, CPE  Spoke with pt's wife, Diane (on dpr), confirming pt now uses [facility].    Updated pt's pharmacy list. E-scribed pravastatin.

## 2019-01-01 NOTE — Patient Instructions (Signed)
Patient Instructions - Cystoscopy After Care     Post-Procedure Information: For approximately 8-12 hours following the cystoscopy, you might have to urinate more frequently than usual and you may experience burning during urination. These minor discomforts should go away within 12-24 hours. If they continue, contact your doctor.     Your urine may be slightly red following your cystoscopy, this is normal. However, if your urine is bright red or if you are passing clots, call the Urology Clinic. If you are unable to urinate, try sitting in a tub of warm water to help you relax. Urinate into the bath water.      Call the Urology office at # [phone] and ask to speak to the Urology Nurse, if you experience any of the following:   bright red bleeding or blood clots in your urine   burning or frequent urination after 24 hours    inability to urinate   fever and/or chills   severe pain in lower abdomen or low back   you break out in a rash     If you have any questions and it is after business hours of 8am to 4:30pm, please call the page operator at # [phone] and ask to speak to the Urologist on-call.

## 2019-01-01 NOTE — Interdisciplinary (Signed)
09:Placed @ Tx#2, prep for cystoscopy, 2% Lidocaine UROJET applied  1000:Md arrived, Time out called  1000:START  1010:END

## 2019-01-02 MED ORDER — SILDENAFIL CITRATE 100 MG PO TABS: 50.0000 mg | ORAL_TABLET | Freq: Every day | ORAL | 3 refills | Status: DC | PRN

## 2019-01-08 ENCOUNTER — Ambulatory Visit (HOSPITAL_BASED_OUTPATIENT_CLINIC_OR_DEPARTMENT_OTHER): Payer: Medicaid Other

## 2019-01-09 ENCOUNTER — Ambulatory Visit (HOSPITAL_BASED_OUTPATIENT_CLINIC_OR_DEPARTMENT_OTHER): Payer: Medicaid Other

## 2019-01-14 ENCOUNTER — Ambulatory Visit
Admission: RE | Admit: 2019-01-14 | Discharge: 2019-01-14 | Disposition: A | Discharge status: Home / Self Care | Payer: Medicare Other | Attending: Diagnostic Radiology | Admitting: Diagnostic Radiology

## 2019-01-14 DIAGNOSIS — M5410 Radiculopathy, site unspecified: Secondary | ICD-10-CM

## 2019-01-14 DIAGNOSIS — M48061 Spinal stenosis, lumbar region without neurogenic claudication: Secondary | ICD-10-CM | POA: Insufficient documentation

## 2019-01-14 DIAGNOSIS — G834 Cauda equina syndrome: Secondary | ICD-10-CM | POA: Insufficient documentation

## 2019-01-14 DIAGNOSIS — M4316 Spondylolisthesis, lumbar region: Secondary | ICD-10-CM | POA: Insufficient documentation

## 2019-01-14 NOTE — Telephone Encounter (Signed)
Patient walked in clinic requesting a call back from MD/Nurse regarding Avitan, he wants to if the one dose will be enough, please call at [phone].

## 2019-01-17 ENCOUNTER — Telehealth (HOSPITAL_BASED_OUTPATIENT_CLINIC_OR_DEPARTMENT_OTHER): Payer: Self-pay | Admitting: Orthopaedic Surgery of the Spine

## 2019-01-17 ENCOUNTER — Telehealth (HOSPITAL_BASED_OUTPATIENT_CLINIC_OR_DEPARTMENT_OTHER): Payer: Self-pay | Admitting: Physician Assistant

## 2019-01-17 ENCOUNTER — Encounter (HOSPITAL_BASED_OUTPATIENT_CLINIC_OR_DEPARTMENT_OTHER): Payer: Self-pay | Admitting: Physician Assistant

## 2019-01-17 DIAGNOSIS — M5416 Radiculopathy, lumbar region: Secondary | ICD-10-CM

## 2019-01-17 NOTE — Telephone Encounter (Signed)
Left vm for pt to call back and schedule with Spine provider.   Please schedule and attach referral.

## 2019-01-17 NOTE — Progress Notes (Signed)
Left message for patient in regards to MRI Lumbar

## 2019-01-17 NOTE — Progress Notes (Signed)
MRI Lumbar Spine reviewed.  Case discussed with Dr. [name]. Will refer patient to Ortho

## 2019-01-25 ENCOUNTER — Other Ambulatory Visit: Payer: Self-pay

## 2019-01-28 ENCOUNTER — Ambulatory Visit: Payer: Medicare Other | Attending: Physician Assistant | Admitting: Nurse Practitioner

## 2019-01-28 VITALS — BP 131/67 | HR 55 | Temp 98.7°F

## 2019-01-28 DIAGNOSIS — M5416 Radiculopathy, lumbar region: Secondary | ICD-10-CM | POA: Insufficient documentation

## 2019-01-28 DIAGNOSIS — M48062 Spinal stenosis, lumbar region with neurogenic claudication: Principal | ICD-10-CM | POA: Insufficient documentation

## 2019-01-28 MED ORDER — ZYRTEC PO
10.00 mg | ORAL | Status: DC
Start: ? — End: 2021-06-02

## 2019-01-28 MED ORDER — CICLOPIROX 0.77 % EX GEL
Freq: Two times a day (BID) | CUTANEOUS | Status: DC
Start: ? — End: 2021-10-29

## 2019-01-28 MED ORDER — TRIAMCINOLONE ACETONIDE 0.1 % EX CREA
1.00 | TOPICAL_CREAM | Freq: Two times a day (BID) | CUTANEOUS | Status: DC
Start: ? — End: 2022-01-19

## 2019-01-28 NOTE — Progress Notes (Signed)
ORTHOPAEDIC SPINE SURGERY     Visit Type: Consult Visit    Requesting Provider: Lars Pinks    Reason for Visit: Back Pain; Neck Pain; and Leg Pain        History Of Present Illness: 68 year old male complains of back, bilateral leg, neck and left arm pain and buttocks pain. Patient rates his pain as 10. Pain is constant described as aching, burning and catching in the left buttocks. He is mostly c/o pain across the waste with some buttocks pain. He states he has difficulty walking due to calf cramping and thigh pain bilaterally. Patient has difficulty walking. The problem started greater than 1 year ago. However in the last few months the walking became more difficult.  The pain started with no precipitating event. Pain is made worse by activity, sitting and walking. Pain is reduced by rest, lying down and medications. Patient has tried  physical therapy for the last 2 months weekly in the last six months, medications and epidural steroid injections. Patient reports no changes in bowel and bladder function.    Allergies:   Allergies   Allergen Reactions   . Sulfa Drugs Unspecified       Medications:   Current Outpatient Medications   Medication Sig   . allopurinol (ZYLOPRIM) 300 MG tablet Take 300 mg by mouth daily.   Marland Kitchen amLODIPINE (NORVASC) 5 MG tablet Take 10 mg by mouth daily.   Marland Kitchen atenolol (TENORMIN) 25 MG tablet Take 25 mg by mouth daily.   . Cetirizine HCl (ZYRTEC PO) 10 mg.    . ciclopirox (LOPROX) 0.77 % GEL Apply topically 2 times daily.   Marland Kitchen docusate sodium (COLACE) 250 MG capsule Take 1 capsule by mouth twice daily while taking narcotic and as needed for constipation.   Marland Kitchen ibuprofen (MOTRIN) 200 MG tablet Take 200 mg by mouth every 6 hours as needed for Mild Pain (Pain Score 1-3).   . Ibuprofen-Diphenhydramine Cit (ADVIL PM PO) as needed (1 qhs).   . meloxicam (MOBIC) 7.5 MG tablet     . phenazopyridine (PYRIDIUM) 100 MG tablet Take 1 tablet (100 mg) by mouth 3 times daily.   . tizanidine (ZANAFLEX) 4  MG capsule Take 4 mg by mouth 3 times daily.   . traMADol (ULTRAM) 50 MG tablet Take 1-2 tablets by mouth every 6 hours as needed for severe pain.   Marland Kitchen triamcinolone (KENALOG) 0.1 % cream Apply 1 Application topically 2 times daily. Apply a thin layer as directed     No current facility-administered medications for this visit.                                 Past Medical History:  has a past medical history of Chronic suprapubic catheter (CMS-HCC), Congenital hydronephrosis, Gout, Headache, Hematuria, Kidney disease, Kidney stones, Major depressive disorder, single episode, Polyarthropathy or polyarthritis of multiple sites, and Urethral stricture.    Past Surgical History:  has a past surgical history that includes Nephrectomy (Right, 1995); CT Insertion Of Suprapubic Cath (09/25/2015); Other surgical history; Appendectomy; Cystoscopy; and Cystoscopy w/ laser lithotripsy.    Social History:  reports that he has never smoked. He has never used smokeless tobacco. He reports previous alcohol use.    Family History: He was adopted. Family history is unknown by patient.    Review of Systems:  CONSTITUTIONAL: No unexpected weight loss, fevers, chills, or anorexia. SKIN: Noncontributory. EARS, NOSE  AND THROAT: Noncontributory. EYES: Noncontributory. LUNGS: Noncontributory. CARDIAC: Noncontributory. GASTROINTESTINAL: Noncontributory. GENITOURINARY: Symptoms as noted above in HPI. NEUROLOGIC: Symptoms as noted above in HPI. MUSCULOSKELETAL: Symptoms as noted above in HPI.    Physical Examination:  BP 131/67   Pulse 55   Temp 98.7 F (37.1 C)   CONSTITUTIONAL: Well appearing and well groomed. PSYCHOLOGICAL: Alert and oriented and appropriate to situation. INTEGUMENT: Intact. VASCULAR: Radial and pedal pulses are intact and symmetric. EXTREMITIES: Bilateral upper and lower extremities have good range of motion and no significant deformities. GAIT: Brisk with good coordination.     CERVICAL SPINE: Nontender to palpation.  Spurling's test negative. Sensation intact of the upper extremities.    RANGE OF MOTION (in degrees)       RT LT  Lateral motion   80  80  Lateral bend   45  45  Flexion 40 degrees. Extension 35 degrees. Motion in all planes is nonpainful.    MOTOR    RT LT  Trapezius   5/5  5/5  Deltoid    5/5  5/5  Bicep    5/5  5/5  Tricep    5/5  5/5  Wrist flexor   5/5  5/5  Wrist extensor  5/5  5/5  Intrinsics   5/5  5/5  Grip    5/5  5/5    DEEP TENDON REFLEXES       RT  LT  Bicep    2+  2+  Brachioradialis  2+  2+  Tricep    2+  2+    THORACIC SPINE  Nontender to percussion. Alignment - normal, no paravertebral muscle fullness. Sensation intact. Beevor's negative. Abdominal reflexes are absent and symmetric.     LUMBAR SPINE  Nontender to percussion. Heel to toe walk without deficit. Sensation intact of the lower extremities. Trendelenburg sign negative. Skin intact.    RANGE OF MOTION        RT  LT  lateral bend, normal at 25 degrees 25  25  rotation, normal at 30 degrees 30  30  Flexion - normal at 40 degrees. Extension - normal at 10 degrees.    MOTOR STRENGTH       RT  LT  Iliopsoas   5/5  5/5  Quadricep   5/5  5/5  Anterior tibialis   5/5  5/5  Extensor hallucis longus 5/5  5/5  Gastrocsoleus  5/5  5/5    DEEP TENDON REFLEXES       RT  LT  Patellar   2+  2+  Achilles   2+  2+    PATHOLOGIC REFLEXES      RT LT  Clonus    Neg Neg  Babinski   Neg Neg  Hoffmans   Neg Neg    STRAIGHT LEG RAISING       RT  LT  Sitting @ 90 degrees   Neg Neg    IMAGING STUDIES:   04/2018 MRi  IMPRESSION:  Multilevel, multifactorial degenerative changes results in up to moderately severe spinal canal stenosis at C5-C6 where there is contact of the ventral and dorsal surfaces of the cervical spinal cord without frank cord compression or internal signal abnormality seen. There is an additional region of moderate spinal canal narrowing immediately inferiorly at C6-C7 with contact of the ventral surface of the cervical spinal cord.    Multilevel  severe facet hypertrophy, asymmetric to the left in the upper to mid cervical spine and asymmetric  to the right in the lower cervical and upper thoracic spine. In conjunction with uncovertebral hypertrophy this results in severe or moderately severe neuroforaminal narrowing on the left at C3-C4 and C4-C5, bilaterally at C5-C6 and C6-C7, and on the right at C7-T1 and T2-T3 with potential compression of the exiting nerve roots at these levels. Minimal periarticular inflammatory changes adjacent to the facet joints could also result in a component of paraspinal cervical pain.    Advanced degenerative disc changes as described above with mixed fatty and fibrovascular end plate changes at L7-L8, C6-C7 and C7-T1 which could be additional source of locoregional/midline pain.    MRI  01/14/19  IMPRESSION:  Compression of the cauda equina at L4-5, in the setting of severe spinal canal narrowing and spondylolisthesis. There is also contact of the exiting L4 nerve root and traversing L5 nerve roots at this level.  At L3-4, severe spinal canal narrowing with preservation of the CSF space.  The right kidney is absent.    OTHER MEDICAL RECORDS/IMAGING STUDIES REVIEWED: None    IMPRESSION: severe lumbar stenosis     PLAN/DISCUSSION: The plan is for an evaluation and possible surgical discussion with Dr. Earnest Conroy. We spent 45 minutes with this patient. More than 50 percent of this time was spent discussing the diagnosis and the treatment plan. All questions were answered and Stephen [name] understood and was satisfied with this plan.     FOLLOWUP: Return to clinic one week. The patient is encouraged to call us with any questions or problems in the interim. Our contact numbers were given to the patient.

## 2019-02-13 ENCOUNTER — Telehealth: Payer: Self-pay | Admitting: *Deleted

## 2019-02-13 NOTE — Telephone Encounter (Signed)
Left message for patient to notify them that it is time to schedule annual low dose lung cancer screening CT scan. Instructed patient to call back to verify information prior to the scan being scheduled.  

## 2019-02-15 ENCOUNTER — Other Ambulatory Visit: Payer: Self-pay

## 2019-02-16 ENCOUNTER — Telehealth: Payer: Self-pay

## 2019-02-16 NOTE — Telephone Encounter (Signed)
Call pt regarding lung screening. Left message to return call. 

## 2019-02-18 ENCOUNTER — Encounter (INDEPENDENT_AMBULATORY_CARE_PROVIDER_SITE_OTHER): Payer: Self-pay | Admitting: Orthopaedic Surgery of the Spine

## 2019-02-18 DIAGNOSIS — M545 Low back pain, unspecified: Secondary | ICD-10-CM

## 2019-02-19 ENCOUNTER — Telehealth: Payer: Self-pay | Admitting: *Deleted

## 2019-02-19 DIAGNOSIS — Z87891 Personal history of nicotine dependence: Secondary | ICD-10-CM

## 2019-02-19 DIAGNOSIS — Z122 Encounter for screening for malignant neoplasm of respiratory organs: Secondary | ICD-10-CM

## 2019-02-19 NOTE — Telephone Encounter (Signed)
Patient has been notified that annual lung cancer screening low dose CT scan is due currently or will be in near future. Confirmed that patient is within the age range of 55-77, and asymptomatic, (no signs or symptoms of lung cancer). Patient denies illness that would prevent curative treatment for lung cancer if found. Verified smoking history, (former, quit 07/20/2011, 42 pack year). The shared decision making visit was done 02/07/17. Patient is agreeable for CT scan being scheduled.

## 2019-02-20 ENCOUNTER — Ambulatory Visit (INDEPENDENT_AMBULATORY_CARE_PROVIDER_SITE_OTHER): Payer: Medicare Other | Admitting: Orthopaedic Surgery of the Spine

## 2019-02-20 ENCOUNTER — Encounter (INDEPENDENT_AMBULATORY_CARE_PROVIDER_SITE_OTHER): Payer: Self-pay | Admitting: Orthopaedic Surgery of the Spine

## 2019-02-20 ENCOUNTER — Inpatient Hospital Stay (INDEPENDENT_AMBULATORY_CARE_PROVIDER_SITE_OTHER): Admit: 2019-02-20 | Discharge: 2019-02-20 | Disposition: A | Discharge status: Home Health | Payer: Medicare Other

## 2019-02-20 VITALS — BP 138/77 | HR 58 | Temp 97.8°F | Ht 68.0 in | Wt 220.0 lb

## 2019-02-20 DIAGNOSIS — M5136 Other intervertebral disc degeneration, lumbar region: Secondary | ICD-10-CM

## 2019-02-20 DIAGNOSIS — M4316 Spondylolisthesis, lumbar region: Secondary | ICD-10-CM

## 2019-02-20 DIAGNOSIS — M4310 Spondylolisthesis, site unspecified: Secondary | ICD-10-CM

## 2019-02-20 DIAGNOSIS — M545 Low back pain, unspecified: Secondary | ICD-10-CM

## 2019-02-20 DIAGNOSIS — M48062 Spinal stenosis, lumbar region with neurogenic claudication: Secondary | ICD-10-CM

## 2019-02-20 MED ORDER — OMEPRAZOLE 20 MG OR CPDR
20.00 mg | DELAYED_RELEASE_CAPSULE | Freq: Every day | ORAL | Status: DC
Start: ? — End: 2021-06-02

## 2019-02-21 ENCOUNTER — Encounter: Payer: Self-pay | Admitting: *Deleted

## 2019-02-21 ENCOUNTER — Encounter (HOSPITAL_BASED_OUTPATIENT_CLINIC_OR_DEPARTMENT_OTHER): Payer: Self-pay | Admitting: Nurse Practitioner

## 2019-02-22 ENCOUNTER — Ambulatory Visit
Admission: RE | Admit: 2019-02-22 | Discharge: 2019-02-22 | Disposition: A | Discharge status: Home / Self Care | Payer: Managed Care, Other (non HMO) | Source: Ambulatory Visit | Attending: Nurse Practitioner | Admitting: Nurse Practitioner

## 2019-02-22 ENCOUNTER — Telehealth (HOSPITAL_BASED_OUTPATIENT_CLINIC_OR_DEPARTMENT_OTHER): Payer: Self-pay | Admitting: Orthopaedic Surgery of the Spine

## 2019-02-22 DIAGNOSIS — Z122 Encounter for screening for malignant neoplasm of respiratory organs: Secondary | ICD-10-CM

## 2019-02-22 DIAGNOSIS — Z87891 Personal history of nicotine dependence: Secondary | ICD-10-CM | POA: Insufficient documentation

## 2019-02-22 NOTE — Telephone Encounter (Signed)
From: Lovena Le  To: Sheria Lang, NP  Sent: 02/21/2019 2:21 PM PST  Subject: 2-Procedural Question    This is Mehmet Scally MB # [id number] saw you on February 10 in regards to my spinal situation saw the spinal surgeon yesterday Vinko Ziomislic please give me your professional opinion are continued injections with Doctor [name] going to be worth doing or is surgery going to be the way to go I am on a cross roads on this decision thanks for your input

## 2019-02-22 NOTE — Telephone Encounter (Signed)
Stephen [name]  requested callback for advice on:  Pt is calling in because he is going to forward with surgery, he states he already signed consent forms , and would like to get on the books for surgery , I explained there is no order yet , to give [name] some time to work on this and will call him back ,Pt states that he has been calling [name] to schedule ,  please advice       Treating Physician:Dr. Zlomislic       LOV notes/plan: 02/20/2019  No notes         Best number to be reached at:  [phone]          Route to RN pool if requesting clinical advice, route to MA if administrative advice

## 2019-02-23 ENCOUNTER — Other Ambulatory Visit: Payer: Self-pay | Admitting: Family Medicine

## 2019-02-23 DIAGNOSIS — K760 Fatty (change of) liver, not elsewhere classified: Secondary | ICD-10-CM

## 2019-02-23 DIAGNOSIS — Z125 Encounter for screening for malignant neoplasm of prostate: Secondary | ICD-10-CM

## 2019-02-23 DIAGNOSIS — R7303 Prediabetes: Secondary | ICD-10-CM

## 2019-02-23 DIAGNOSIS — E7800 Pure hypercholesterolemia, unspecified: Secondary | ICD-10-CM

## 2019-02-25 ENCOUNTER — Encounter: Payer: Self-pay | Admitting: *Deleted

## 2019-02-25 ENCOUNTER — Telehealth (HOSPITAL_BASED_OUTPATIENT_CLINIC_OR_DEPARTMENT_OTHER): Payer: Self-pay | Admitting: Orthopaedic Surgery of the Spine

## 2019-02-25 NOTE — Telephone Encounter (Signed)
A user error has taken place: encounter opened in error, closed for administrative reasons.

## 2019-02-25 NOTE — Telephone Encounter (Signed)
Routing to Dr. Earnest Conroy and team for surgery order. Will call pt back when order is complete.

## 2019-02-26 ENCOUNTER — Other Ambulatory Visit (INDEPENDENT_AMBULATORY_CARE_PROVIDER_SITE_OTHER): Payer: Managed Care, Other (non HMO)

## 2019-02-26 DIAGNOSIS — K760 Fatty (change of) liver, not elsewhere classified: Secondary | ICD-10-CM

## 2019-02-26 DIAGNOSIS — R7303 Prediabetes: Secondary | ICD-10-CM | POA: Diagnosis not present

## 2019-02-26 DIAGNOSIS — Z125 Encounter for screening for malignant neoplasm of prostate: Secondary | ICD-10-CM | POA: Diagnosis not present

## 2019-02-26 DIAGNOSIS — E7800 Pure hypercholesterolemia, unspecified: Secondary | ICD-10-CM | POA: Diagnosis not present

## 2019-02-26 LAB — BASIC METABOLIC PANEL
BUN: 23 mg/dL (ref 6–23)
CO2: 28 mEq/L (ref 19–32)
Calcium: 9.2 mg/dL (ref 8.4–10.5)
Chloride: 104 mEq/L (ref 96–112)
Creatinine, Ser: 1.15 mg/dL (ref 0.40–1.50)
GFR: 63.77 mL/min (ref >60.00)
Glucose, Bld: 93 mg/dL (ref 70–99)
Potassium: 4.3 mEq/L (ref 3.5–5.1)
Sodium: 140 mEq/L (ref 135–145)

## 2019-02-26 LAB — LIPID PANEL
Cholesterol: 167 mg/dL (ref 0–200)
HDL: 51.9 mg/dL (ref >39.00)
LDL Cholesterol: 85 mg/dL (ref 0–99)
NonHDL: 115.09
Total CHOL/HDL Ratio: 3
Triglycerides: 149 mg/dL (ref 0.0–149.0)
VLDL: 29.8 mg/dL (ref 0.0–40.0)

## 2019-02-26 LAB — TSH: TSH: 1.25 u[IU]/mL (ref 0.35–4.50)

## 2019-02-26 LAB — HEMOGLOBIN A1C: Hgb A1c MFr Bld: 5.9 % (ref 4.6–6.5)

## 2019-02-26 LAB — PSA: PSA: 3.59 ng/mL (ref 0.10–4.00)

## 2019-02-27 ENCOUNTER — Telehealth (HOSPITAL_BASED_OUTPATIENT_CLINIC_OR_DEPARTMENT_OTHER): Payer: Self-pay | Admitting: Physician Assistant

## 2019-02-27 NOTE — Telephone Encounter (Signed)
Pt is requesting medication for his arthritis and he will be scheduling a procedure with Dr. Earnest Conroy, please advise no further info provided.

## 2019-02-27 NOTE — Telephone Encounter (Signed)
Per Lattie Haw,     Based on review of chart, I can't find record of Stephen [name] prescribing any medication for patient. Please let him know to touch base with PCP since we never prescribed anything in past.

## 2019-02-28 NOTE — Progress Notes (Signed)
BP 118/62 (BP Location: Left Arm, Patient Position: Sitting, Cuff Size: Large)   Pulse (!) 57   Temp 98.6 F (37 C) (Oral)   Ht '5\' 8"'$  (1.727 m)   Wt 243 lb 3 oz (110.3 kg)   SpO2 94%   BMI 36.98 kg/m    CC: CPE Subjective:    Patient ID: Johnny Holland, male    DOB: Oct 13, 1951, 68 y.o.   MRN: [id number]  HPI: Johnny Holland is a 68 y.o. male presenting on 03/01/2019 for Annual Exam   Suffered R bell's palsy 11/2018, initially saw eye doctor. Fully recovered.   Ongoing weight gain noted. Poor dietary habits.   Preventative: COLONOSCOPY 11/30/06 diverticulosis; no polyps. iFOB since then - requests repeat  Prostate cancer screening -yearly Lung cancer screening - yearly, reassuring Fluvax - declines Td - 2007, Tdap 2018 prevnar13 02/2019 Zostavax - 2014 shingrix - completed ([facility]) Advanced directive discussion - has at home, wife is HCPOA. Asked to bring [name] copy.  Seat belt use discussed Sunscreen use discussed.No suspicious moles.Sees derm regularly.  Ex smoker - quit 2011 prior 1-2 ppd (42+ PY history). Continues vaping.  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 Interim medical history since our last visit reviewed. Allergies and medications reviewed and updated. Outpatient Medications Prior to Visit  Medication Sig Dispense Refill  . albuterol (PROVENTIL HFA;VENTOLIN HFA) 108 (90 Base) MCG/ACT inhaler Inhale 2 puffs into the lungs every 6 (six) hours as needed for wheezing or shortness of breath. 1 Inhaler 0  .  aspirin 81 MG tablet Take 1 tablet (81 mg total) by mouth daily.    . Multiple Vitamin (MULTI-VITAMIN DAILY PO) Take by mouth.    . pravastatin (PRAVACHOL) 80 MG tablet Take 1 tablet (80 mg total) by mouth at bedtime. 90 tablet 0  . scopolamine (TRANSDERM-SCOP, 1.5 MG,) 1 MG/3DAYS Place 1 patch (1.5 mg total) onto the skin every 3 (three) days. (Patient taking differently: Place 1 patch onto the skin every 3 (three) days. As needed) 4 patch 1  . sildenafil (VIAGRA) 100 MG tablet Take 0.5-1 tablets (50-100 mg total) by mouth daily as needed for erectile dysfunction. 15 tablet 3  . predniSONE (DELTASONE) 10 MG tablet Take 1 tablet (10 mg total) by mouth daily. Take 6 pills every day for 6 days then Take 4 pills a day for 2 days then Take 2 pills a day for 2 days then Take 1 pill a day for 2 days then stop 50 tablet 0   No facility-administered medications prior to visit.      Per HPI unless specifically indicated in ROS section below Review of Systems  Constitutional: Negative for activity change, appetite change, chills, fatigue, fever and unexpected weight change.  HENT: Negative for hearing loss.   Eyes: Negative for visual disturbance.  Respiratory: Negative for cough, chest tightness, shortness of breath and wheezing.   Cardiovascular: Negative for chest pain, palpitations and leg swelling.  Gastrointestinal: Negative for abdominal distention, abdominal pain, blood in stool, constipation, diarrhea, nausea and vomiting.  Genitourinary: Negative for difficulty  urinating and hematuria.  Musculoskeletal: Negative for arthralgias, myalgias and neck pain.  Skin: Negative for rash.  Neurological: Positive for headaches. Negative for dizziness, seizures and syncope.  Hematological: Negative for adenopathy. Does not bruise/bleed easily.  Psychiatric/Behavioral: Negative for dysphoric mood. The patient is nervous/anxious (intermittent quickly goes away).    Objective:    BP 118/62 (BP  Location: Left Arm, Patient Position: Sitting, Cuff Size: Large)   Pulse (!) 57   Temp 98.6 F (37 C) (Oral)   Ht '5\' 8"'$  (1.727 m)   Wt 243 lb 3 oz (110.3 kg)   SpO2 94%   BMI 36.98 kg/m   Wt Readings from Last 3 Encounters:  03/01/19 243 lb 3 oz (110.3 kg)  02/22/19 235 lb (106.6 kg)  12/03/18 230 lb (104.3 kg)    Physical Exam Vitals signs and nursing note reviewed.  Constitutional:      General: He is not in acute distress.    Appearance: Normal appearance. He is well-developed.  HENT:     Head: Normocephalic and atraumatic.     Right Ear: Hearing, tympanic membrane, ear canal and external ear normal.     Left Ear: Hearing, tympanic membrane, ear canal and external ear normal.     Nose: Nose normal. No congestion.     Mouth/Throat:     Mouth: Mucous membranes are moist.     Pharynx: Uvula midline. No oropharyngeal exudate or posterior oropharyngeal erythema.  Eyes:     General: No scleral icterus.    Conjunctiva/sclera: Conjunctivae normal.     Pupils: Pupils are equal, round, and reactive to light.  Neck:     Musculoskeletal: Normal range of motion and neck supple.     Vascular: No carotid bruit.  Cardiovascular:     Rate and Rhythm: Normal rate and regular rhythm.     Pulses: Normal pulses.          Radial pulses are 2+ on the right side and 2+ on the left side.     Heart sounds: Murmur (4/6 systolic at USB) present.  Pulmonary:     Effort: Pulmonary effort is normal. No respiratory distress.     Breath sounds: Normal breath sounds. No wheezing, rhonchi or rales.  Abdominal:     General: Bowel sounds are normal. There is no distension.     Palpations: Abdomen is soft. There is no mass.     Tenderness: There is no abdominal tenderness. There is no guarding or rebound.  Genitourinary:    Prostate: Enlarged (30gm). Not tender and no nodules present.     Rectum: External hemorrhoid (noninflamed) present. No mass, tenderness, anal fissure or internal hemorrhoid. Normal  anal tone.  Musculoskeletal: Normal range of motion.  Lymphadenopathy:     Cervical: No cervical adenopathy.  Skin:    General: Skin is warm and dry.     Findings: No rash.  Neurological:     Mental Status: He is alert and oriented to person, place, and time.     Comments: CN grossly intact, station and gait intact  Psychiatric:        Mood and Affect: Mood normal.        Behavior: Behavior normal.        Thought Content: Thought content normal.        Judgment: Judgment normal.       Results for orders placed or performed in visit on 57/84/69  Basic metabolic panel  Result Value Ref Range   Sodium 140  135 - 145 mEq/L   Potassium 4.3 3.5 - 5.1 mEq/L   Chloride 104 96 - 112 mEq/L   CO2 28 19 - 32 mEq/L   Glucose, Bld 93 70 - 99 mg/dL   BUN 23 6 - 23 mg/dL   Creatinine, Ser 1.15 0.40 - 1.50 mg/dL   Calcium 9.2 8.4 - 10.5 mg/dL   GFR 63.77 >60.00 mL/min  PSA  Result Value Ref Range   PSA 3.59 0.10 - 4.00 ng/mL  Hemoglobin A1c  Result Value Ref Range   Hgb A1c MFr Bld 5.9 4.6 - 6.5 %  TSH  Result Value Ref Range   TSH 1.25 0.35 - 4.50 uIU/mL  Lipid panel  Result Value Ref Range   Cholesterol 167 0 - 200 mg/dL   Triglycerides 149.0 0.0 - 149.0 mg/dL   HDL 51.90 >39.00 mg/dL   VLDL 29.8 0.0 - 40.0 mg/dL   LDL Cholesterol 85 0 - 99 mg/dL   Total CHOL/HDL Ratio 3    NonHDL 115.09    Depression screen PHQ 2/9 03/01/2019  Decreased Interest 0  Down, Depressed, Hopeless 1  PHQ - 2 Score 1    Assessment & Plan:   Problem List Items Addressed This Visit    Thoracic aorta atherosclerosis (HCC)    Continue aspirin, statin.       Severe obesity (BMI 35.0-35.9 with comorbidity) ([place])    Continue to encourage healthy diet and lifestyle changes to affect sustainable weight loss.       Right-sided Bell's palsy    Fortunately has recovered very well from this.       Prediabetes    Encouraged avoiding added sugars in diet.       Personal history of tobacco use,  presenting hazards to health    Reviewed latest reassuring CT scan last week      HLD (hyperlipidemia)    Chronic, stable. Continue current regimen. The 10-year ASCVD risk score Mikey Bussing DC Brooke Bonito., et al., 2013) is: 9.6%   Values used to calculate the score:     Age: 65 years     Sex: Male     Is Non-Hispanic African American: No     Diabetic: No     Tobacco smoker: No     Systolic Blood Pressure: 884 mmHg     Is BP treated: No     HDL Cholesterol: 51.9 mg/dL     Total Cholesterol: 167 mg/dL       Healthcare maintenance - Primary    Preventative protocols reviewed and updated unless pt declined. Discussed healthy diet and lifestyle.       COPD (chronic obstructive pulmonary disease) (HCC)    Mild by CT. Will recommend spirometry next visit.       CAD (coronary artery disease)    By CT. He did have reassuring stress test 04/2018.       Aortic regurgitation    Update echo. Pt would like to see cards Q2 yrs.       Relevant Orders   ECHOCARDIOGRAM COMPLETE   Advanced care planning/counseling discussion    Advanced directive discussion - has at home, wife is HCPOA. Asked to bring [name] copy.        Other Visit Diagnoses    Need for vaccination with 13-polyvalent pneumococcal conjugate vaccine       Relevant Orders   Pneumococcal conjugate vaccine 13-valent IM (Completed)   Special screening for malignant neoplasms, colon       Relevant Orders  Fecal occult blood, imunochemical       No orders of the defined types were placed in this encounter.  Orders Placed This Encounter  Procedures  . Fecal occult blood, imunochemical    Standing Status:   Future    Standing Expiration Date:   02/29/2020  . Pneumococcal conjugate vaccine 13-valent IM  . ECHOCARDIOGRAM COMPLETE    Standing Status:   Future    Standing Expiration Date:   05/31/2020    Order Specific Question:   Where should this test be performed    Answer:   [facility]    Order Specific Question:   Perflutren  DEFINITY (image enhancing agent) should be administered unless hypersensitivity or allergy exist    Answer:   Administer Perflutren    Order Specific Question:   Reason for exam-Echo    Answer:   Murmur  785.2 / R01.1    Order Specific Question:   Other Comments    Answer:   please schedule after 04/01/2019    Patient instructions: Pass by lab to pick up stool kit.  Prevnar today.  We will set you up for repeat ultrasound.  Bring [name] copy of your advanced directives to update chart.  Good to see you today. Return as needed or in 1 year for next physical.   Follow up plan: Return in about 1 year (around 02/29/2020) for annual exam, prior fasting for blood work.  Ria Bush, MD

## 2019-03-01 ENCOUNTER — Other Ambulatory Visit: Payer: Self-pay

## 2019-03-01 ENCOUNTER — Encounter: Payer: Self-pay | Admitting: Family Medicine

## 2019-03-01 ENCOUNTER — Ambulatory Visit (INDEPENDENT_AMBULATORY_CARE_PROVIDER_SITE_OTHER): Payer: Managed Care, Other (non HMO) | Admitting: Family Medicine

## 2019-03-01 VITALS — BP 118/62 | HR 57 | Temp 98.6°F | Ht 68.0 in | Wt 243.2 lb

## 2019-03-01 DIAGNOSIS — I351 Nonrheumatic aortic (valve) insufficiency: Secondary | ICD-10-CM | POA: Diagnosis not present

## 2019-03-01 DIAGNOSIS — Z0000 Encounter for general adult medical examination without abnormal findings: Secondary | ICD-10-CM | POA: Diagnosis not present

## 2019-03-01 DIAGNOSIS — Z1211 Encounter for screening for malignant neoplasm of colon: Secondary | ICD-10-CM

## 2019-03-01 DIAGNOSIS — I2510 Atherosclerotic heart disease of native coronary artery without angina pectoris: Secondary | ICD-10-CM

## 2019-03-01 DIAGNOSIS — Z23 Encounter for immunization: Secondary | ICD-10-CM | POA: Diagnosis not present

## 2019-03-01 DIAGNOSIS — Z6835 Body mass index (BMI) 35.0-35.9, adult: Secondary | ICD-10-CM

## 2019-03-01 DIAGNOSIS — G510 Bell's palsy: Secondary | ICD-10-CM

## 2019-03-01 DIAGNOSIS — E7800 Pure hypercholesterolemia, unspecified: Secondary | ICD-10-CM

## 2019-03-01 DIAGNOSIS — R7303 Prediabetes: Secondary | ICD-10-CM

## 2019-03-01 DIAGNOSIS — Z87891 Personal history of nicotine dependence: Secondary | ICD-10-CM

## 2019-03-01 DIAGNOSIS — I700 Atherosclerosis of aorta: Secondary | ICD-10-CM

## 2019-03-01 DIAGNOSIS — Z7189 Other specified counseling: Secondary | ICD-10-CM

## 2019-03-01 DIAGNOSIS — J432 Centrilobular emphysema: Secondary | ICD-10-CM

## 2019-03-01 NOTE — Assessment & Plan Note (Signed)
Mild by CT. Will recommend spirometry next visit.

## 2019-03-01 NOTE — Assessment & Plan Note (Signed)
Preventative protocols reviewed and updated unless pt declined. Discussed healthy diet and lifestyle.  

## 2019-03-01 NOTE — Assessment & Plan Note (Signed)
Continue aspirin, statin.  

## 2019-03-01 NOTE — Assessment & Plan Note (Signed)
Update echo. Pt would like to see cards Q2 yrs.

## 2019-03-01 NOTE — Assessment & Plan Note (Signed)
By CT. He did have reassuring stress test 04/2018.

## 2019-03-01 NOTE — Assessment & Plan Note (Signed)
Advanced directive discussion - has at home, wife is HCPOA. Asked to bring us copy. 

## 2019-03-01 NOTE — Assessment & Plan Note (Signed)
Continue to encourage healthy diet and lifestyle changes to affect sustainable weight loss.  

## 2019-03-01 NOTE — Assessment & Plan Note (Signed)
Reviewed latest reassuring CT scan last week

## 2019-03-01 NOTE — Assessment & Plan Note (Signed)
Chronic, stable. Continue current regimen. The 10-year ASCVD risk score Mikey Bussing DC Brooke Bonito., et al., 2013) is: 9.6%   Values used to calculate the score:     Age: 68 years     Sex: Male     Is Non-Hispanic African American: No     Diabetic: No     Tobacco smoker: No     Systolic Blood Pressure: 830 mmHg     Is BP treated: No     HDL Cholesterol: 51.9 mg/dL     Total Cholesterol: 167 mg/dL

## 2019-03-01 NOTE — Assessment & Plan Note (Signed)
Encouraged avoiding added sugars in diet.  

## 2019-03-01 NOTE — Patient Instructions (Addendum)
Pass by lab to pick up stool kit.  Prevnar today.  We will set you up for repeat ultrasound.  Bring [name] copy of your advanced directives to update chart.  Good to see you today. Return as needed or in 1 year for next physical.   Health Maintenance After Age 66 After age 58, you are at a higher risk for certain long-term diseases and infections as well as injuries from falls. Falls are a major cause of broken bones and head injuries in people who are older than age 86. Getting regular preventive care can help to keep you healthy and well. Preventive care includes getting regular testing and making lifestyle changes as recommended by your health care provider. Talk with your health care provider about:  Which screenings and tests you should have. A screening is a test that checks for a disease when you have no symptoms.  A diet and exercise plan that is right for you. What should I know about screenings and tests to prevent falls? Screening and testing are the best ways to find a health problem early. Early diagnosis and treatment give you the best chance of managing medical conditions that are common after age 31. Certain conditions and lifestyle choices may make you more likely to have a fall. Your health care provider may recommend:  Regular vision checks. Poor vision and conditions such as cataracts can make you more likely to have a fall. If you wear glasses, make sure to get your prescription updated if your vision changes.  Medicine review. Work with your health care provider to regularly review all of the medicines you are taking, including over-the-counter medicines. Ask your health care provider about any side effects that may make you more likely to have a fall. Tell your health care provider if any medicines that you take make you feel dizzy or sleepy.  Osteoporosis screening. Osteoporosis is a condition that causes the bones to get weaker. This can make the bones weak and cause them to  break more easily.  Blood pressure screening. Blood pressure changes and medicines to control blood pressure can make you feel dizzy.  Strength and balance checks. Your health care provider may recommend certain tests to check your strength and balance while standing, walking, or changing positions.  Foot health exam. Foot pain and numbness, as well as not wearing proper footwear, can make you more likely to have a fall.  Depression screening. You may be more likely to have a fall if you have a fear of falling, feel emotionally low, or feel unable to do activities that you used to do.  Alcohol use screening. Using too much alcohol can affect your balance and may make you more likely to have a fall. What actions can I take to lower my risk of falls? General instructions  Talk with your health care provider about your risks for falling. Tell your health care provider if: ? You fall. Be sure to tell your health care provider about all falls, even ones that seem minor. ? You feel dizzy, sleepy, or off-balance.  Take over-the-counter and prescription medicines only as told by your health care provider. These include any supplements.  Eat a healthy diet and maintain a healthy weight. A healthy diet includes low-fat dairy products, low-fat (lean) meats, and fiber from whole grains, beans, and lots of fruits and vegetables. Home safety  Remove any tripping hazards, such as rugs, cords, and clutter.  Install safety equipment such as grab bars in bathrooms  and safety rails on stairs.  Keep rooms and walkways well-lit. Activity   Follow a regular exercise program to stay fit. This will help you maintain your balance. Ask your health care provider what types of exercise are appropriate for you.  If you need a cane or walker, use it as recommended by your health care provider.  Wear supportive shoes that have nonskid soles. Lifestyle  Do not drink alcohol if your health care provider tells  you not to drink.  If you drink alcohol, limit how much you have: ? 0-1 drink a day for women. ? 0-2 drinks a day for men.  Be aware of how much alcohol is in your drink. In the [place] one drink equals one typical bottle of beer (12 oz), one-half glass of wine (5 oz), or one shot of hard liquor (1 oz).  Do not use any products that contain nicotine or tobacco, such as cigarettes and e-cigarettes. If you need help quitting, ask your health care provider. Summary  Having a healthy lifestyle and getting preventive care can help to protect your health and wellness after age 74.  Screening and testing are the best way to find a health problem early and help you avoid having a fall. Early diagnosis and treatment give you the best chance for managing medical conditions that are more common for people who are older than age 24.  Falls are a major cause of broken bones and head injuries in people who are older than age 46. Take precautions to prevent a fall at home.  Work with your health care provider to learn what changes you can make to improve your health and wellness and to prevent falls. This information is not intended to replace advice given to you by your health care provider. Make sure you discuss any questions you have with your health care provider. Document Released: 10/18/2017 Document Revised: 10/18/2017 Document Reviewed: 10/18/2017 Elsevier Interactive Patient Education  2019 [employer].

## 2019-03-01 NOTE — Assessment & Plan Note (Signed)
Fortunately has recovered very well from this.

## 2019-03-01 NOTE — Telephone Encounter (Signed)
Pt is f/up on previous message, pt aware he will need to contact PCP for medication management.

## 2019-03-06 ENCOUNTER — Encounter: Payer: Self-pay | Admitting: *Deleted

## 2019-03-18 ENCOUNTER — Other Ambulatory Visit (INDEPENDENT_AMBULATORY_CARE_PROVIDER_SITE_OTHER): Payer: Managed Care, Other (non HMO)

## 2019-03-18 ENCOUNTER — Encounter (HOSPITAL_BASED_OUTPATIENT_CLINIC_OR_DEPARTMENT_OTHER): Payer: Self-pay | Admitting: Anesthesiology

## 2019-03-18 DIAGNOSIS — Z1211 Encounter for screening for malignant neoplasm of colon: Secondary | ICD-10-CM | POA: Diagnosis not present

## 2019-03-18 LAB — FECAL OCCULT BLOOD, IMMUNOCHEMICAL: Fecal Occult Bld: NEGATIVE

## 2019-03-18 LAB — FECAL OCCULT BLOOD, GUAIAC: Fecal Occult Blood: NEGATIVE

## 2019-03-19 ENCOUNTER — Encounter: Payer: Self-pay | Admitting: Family Medicine

## 2019-03-19 NOTE — Telephone Encounter (Signed)
From: Lovena Le  To: Sunday Corn, MD  Sent: 03/18/2019 12:34 PM PDT  Subject: 2-Procedural Question    MR# [id number] saw Dr. Carollee Massed and his P.A. in regards to surgery as an option vs continuing injections for my progressive arthritis what is your advice on my situation? I know it's my call but I am at a crossroads on this/ I respect your opinion as you have seen so many patients in my situation/ thank you for sharing your Shaheed Schmuck / when will you be doing more procedures?

## 2019-03-20 ENCOUNTER — Encounter (HOSPITAL_BASED_OUTPATIENT_CLINIC_OR_DEPARTMENT_OTHER): Payer: Self-pay | Admitting: Hospital

## 2019-03-24 DIAGNOSIS — M48062 Spinal stenosis, lumbar region with neurogenic claudication: Secondary | ICD-10-CM | POA: Insufficient documentation

## 2019-03-24 DIAGNOSIS — M4310 Spondylolisthesis, site unspecified: Secondary | ICD-10-CM | POA: Insufficient documentation

## 2019-03-24 NOTE — Progress Notes (Addendum)
ORTHOPAEDIC SPINE SURGERY     Visit Type: New Visit    Requesting Provider: Sheria Lang    Reason for Visit: New Patient (LBP Bilateral thigh and calf pain)        History Of Present Illness: 68 year old male presents to spine clinic for initial evaluation of complaints of low back pain. He states that he has long history of low back pain that he has been able to tolerate, however over the past several months he has had progression of weakness, with associated paresthesias and numbness. Symptoms are worse with activity and relieved by rest.  He also has complaints of neck pain with associated upper extremity paresthesias.  Patient rates his pain as 8. He states that symptoms are worse with activity and relieved by rest. He has had extensive course of conservative management in the form of time, activity modification, pain medications, PT and injections without any sustained relief.  Patient reports no changes in bowel and bladder function.    Allergies:   Allergies   Allergen Reactions   . Sulfa Drugs Unspecified       Medications:   Current Outpatient Medications   Medication Sig   . allopurinol (ZYLOPRIM) 300 MG tablet Take 300 mg by mouth daily.   Marland Kitchen amLODIPINE (NORVASC) 5 MG tablet Take 10 mg by mouth daily.   Marland Kitchen atenolol (TENORMIN) 25 MG tablet Take 25 mg by mouth daily.   . Cetirizine HCl (ZYRTEC PO) 10 mg.    . ciclopirox (LOPROX) 0.77 % GEL Apply topically 2 times daily.   Marland Kitchen docusate sodium (COLACE) 250 MG capsule Take 1 capsule by mouth twice daily while taking narcotic and as needed for constipation.   Marland Kitchen ibuprofen (MOTRIN) 200 MG tablet Take 200 mg by mouth every 6 hours as needed for Mild Pain (Pain Score 1-3).   . Ibuprofen-Diphenhydramine Cit (ADVIL PM PO) as needed (1 qhs).   . meloxicam (MOBIC) 7.5 MG tablet     . omeprazole (PRILOSEC) 20 MG capsule Take 20 mg by mouth daily.   . phenazopyridine (PYRIDIUM) 100 MG tablet Take 1 tablet (100 mg) by mouth 3 times daily.   . tizanidine (ZANAFLEX) 4 MG  capsule Take 4 mg by mouth 3 times daily.   . traMADol (ULTRAM) 50 MG tablet Take 1-2 tablets by mouth every 6 hours as needed for severe pain.   Marland Kitchen triamcinolone (KENALOG) 0.1 % cream Apply 1 Application topically 2 times daily. Apply a thin layer as directed     No current facility-administered medications for this visit.                                 Past Medical History:  has a past medical history of Chronic suprapubic catheter (CMS-HCC), Congenital hydronephrosis, Gout, Headache, Hematuria, Kidney disease, Kidney stones, Major depressive disorder, single episode, Polyarthropathy or polyarthritis of multiple sites, and Urethral stricture.    Past Surgical History:  has a past surgical history that includes Nephrectomy (Right, 1995); CT Insertion Of Suprapubic Cath (09/25/2015); Other surgical history; Appendectomy; Cystoscopy; and Cystoscopy w/ laser lithotripsy.    Social History:  reports that he has never smoked. He has never used smokeless tobacco. He reports previous alcohol use.    Family History: He was adopted. Family history is unknown by patient.    Review of Systems:  CONSTITUTIONAL: No unexpected weight loss, fevers, chills, or anorexia. SKIN: Noncontributory. EARS, NOSE  AND THROAT: Noncontributory. EYES: Noncontributory. LUNGS: Noncontributory. CARDIAC: Noncontributory. GASTROINTESTINAL: Noncontributory. GENITOURINARY: Symptoms as noted above in HPI. NEUROLOGIC: Symptoms as noted above in HPI. MUSCULOSKELETAL: Symptoms as noted above in HPI.    Physical Examination:  BP 138/77 (BP Location: Left arm, BP Patient Position: Sitting, BP cuff size: Regular)   Pulse 58   Temp 97.8 F (36.6 C) (Oral)   Ht 5\' 8"  (1.727 m)   Wt 99.8 kg (220 lb)   BMI 33.45 kg/m   CONSTITUTIONAL: Well appearing and well groomed. PSYCHOLOGICAL: Alert and oriented and appropriate to situation. INTEGUMENT: Intact. VASCULAR: Radial and pedal pulses are intact and symmetric. EXTREMITIES: Bilateral upper and lower  extremities have good range of motion and no significant deformities. GAIT: Brisk with good coordination.     CERVICAL SPINE: Nontender to palpation. Spurling's test negative. Sensation intact of the upper extremities.    RANGE OF MOTION (in degrees)       RT LT  Lateral motion   80  80  Lateral bend   45  45  Flexion 40 degrees. Extension 35 degrees. Motion in all planes is nonpainful.    MOTOR    RT LT  Trapezius   5/5  5/5  Deltoid    5/5  5/5  Bicep    5/5  5/5  Tricep    5/5  5/5  Wrist flexor   5/5  5/5  Wrist extensor  5/5  5/5  Intrinsics   5/5  5/5  Grip    5/5  5/5    DEEP TENDON REFLEXES       RT  LT  Bicep    2+  2+  Brachioradialis  2+  2+  Tricep    2+  2+    THORACIC SPINE  Nontender to percussion. Alignment - normal, no paravertebral muscle fullness. Sensation intact. Beevor's negative. Abdominal reflexes are absent and symmetric.     LUMBAR SPINE  Nontender to percussion. Heel to toe walk without deficit. Sensation intact of the lower extremities. Trendelenburg sign negative. Skin intact.    RANGE OF MOTION        RT  LT  lateral bend, normal at 25 degrees 25  25  rotation, normal at 30 degrees 30  30  Flexion - normal at 40 degrees. Extension - normal at 10 degrees.    MOTOR STRENGTH       RT  LT  Iliopsoas   5/5  5/5  Quadricep   5/5  5/5  Anterior tibialis   5/5  5/5  Extensor hallucis longus 5/5  5/5  Gastrocsoleus  5/5  5/5    DEEP TENDON REFLEXES       RT  LT  Patellar   2+  2+  Achilles   2+  2+    PATHOLOGIC REFLEXES      RT LT  Clonus    Neg Neg  Babinski   Neg Neg  Hoffmans   Neg Neg    STRAIGHT LEG RAISING       RT  LT  Sitting @ 90 degrees   Neg Neg    IMAGING STUDIES:     MRI  01/14/19 shows:  IMPRESSION:  Compression of the cauda equina at L4-5, in the setting of severe spinal canal narrowing and spondylolisthesis. There is also contact of the exiting L4 nerve root and traversing L5 nerve roots at this level.  At L3-4, severe spinal canal narrowing with preservation of the CSF  space.  The right kidney is absent.    MRI cervical spine 2019 shows:  MRI cervical spine show:  IMPRESSION:  Multilevel, multifactorial degenerative changes results in up to moderately severe spinal canal stenosis at C5-C6 where there is contact of the ventral and dorsal surfaces of the cervical spinal cord without frank cord compression or internal signal abnormality seen. There is an additional region of moderate spinal canal narrowing immediately inferiorly at C6-C7 with contact of the ventral surface of the cervical spinal cord.    Multilevel severe facet hypertrophy, asymmetric to the left in the upper to mid cervical spine and asymmetric to the right in the lower cervical and upper thoracic spine. In conjunction with uncovertebral hypertrophy this results in severe or moderately severe neuroforaminal narrowing on the left at C3-C4 and C4-C5, bilaterally at C5-C6 and C6-C7, and on the right at C7-T1 and T2-T3 with potential compression of the exiting nerve roots at these levels. Minimal periarticular inflammatory changes adjacent to the facet joints could also result in a component of paraspinal cervical pain.    Advanced degenerative disc changes as described above with mixed fatty and fibrovascular end plate changes at O7-F6, C6-C7 and C7-T1 which could be additional source of locoregional/midline pain.    OTHER MEDICAL RECORDS/IMAGING STUDIES REVIEWED: None    IMPRESSION: No diagnosis found.    PLAN/DISCUSSION: Reviewed clinical history as well as exam results and imaging findings with patient in detail. 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 He has had extensive and exhaustive course of conservative management with time, activity modification, pain medications, PT and injections, without any sustained relief of symptoms.  He is interested in surgical address. We have explained that this would involve XLIF L3  thru L5 with laminectomy and PSIF L3 thru L5.  We have discussed the planned procedure as well as r/b/a in detail. I have described the surgery / procedure in detail.  All the patient's questions were answered. Lovena Le understood the possible complications of surgery /  procedure which include but are not limited to: ongoing pain, numbness in the extremities, weakness, nerve injury, dural tear, infection, paralysis, spinal cord injury, need for further surgery, failure of fusion, possible need for reoperation if the fusion fails, instrumentation failure to include the need for repositioning of the implant, breakage, prolonged recuperation, bleeding, need for blood transfusions which includes the risk of a blood reaction, infection or disease, medical complications including pneumonia, prolonged intubation requiring respiratory support, tracheostomy, heart attack, stroke, deep vein thrombosis, pulmonary embolism, blood vessel injury, anesthesia complications and even death.  There is a risk of unsuccessful results from either foreseen or unforeseen causes. Having understood the risks and benefits the patient asked [name] to proceed with surgery / procedure. He will contact our scheduler to set up a time for surgery. We spent 45 minutes with this patient. More than 50 percent of this time was spent discussing the diagnosis and the treatment plan. All questions were answered and Lux Meaders understood and was satisfied with this plan.     FOLLOWUP: Return to clinic for post-operative examination or six weeks. The patient is encouraged to call us with any questions or problems in the interim. Our contact numbers were given to the patient.

## 2019-04-15 NOTE — Telephone Encounter (Signed)
Mychart message sent to update on status of injections and procedures.

## 2019-04-17 ENCOUNTER — Telehealth: Payer: Self-pay | Admitting: Cardiovascular Disease

## 2019-04-17 ENCOUNTER — Encounter (HOSPITAL_BASED_OUTPATIENT_CLINIC_OR_DEPARTMENT_OTHER): Payer: Self-pay | Admitting: Orthopaedic Surgery of the Spine

## 2019-04-17 NOTE — Telephone Encounter (Signed)
Message fwd to [facility] scheduling.

## 2019-04-17 NOTE — Telephone Encounter (Signed)
  Patient is calling because someone called him yesterday regarding possibly changing his echo appt on Friday. I looked for a note to see what needed to be done but could not find a note. He said that he cannot get calls inside his work but he can get texts or leave a number where he can call back. He is okay to still come for his appt on Friday.

## 2019-04-18 ENCOUNTER — Other Ambulatory Visit: Payer: Self-pay | Admitting: Family Medicine

## 2019-04-18 NOTE — Telephone Encounter (Signed)
From: Stephen [name]  To: [name], MD  Sent: 7/40/9796 3:58 PM PDT  Subject: 2-Procedural Question    [name] has me on the surgery list going to talk with Dr. [name] one more time Monday May 4th yes my MRI picture (especially on the bottom left those discs stuck together and a bone spur) does not look like continued injections are going to save me my situation is not getting better especially my neck thank you for your message will get back with [name] after my talk with [name]/MR#[id number]

## 2019-04-19 ENCOUNTER — Other Ambulatory Visit: Payer: Managed Care, Other (non HMO)

## 2019-04-22 ENCOUNTER — Ambulatory Visit: Payer: Medicare Other | Admitting: Anesthesiology

## 2019-04-22 DIAGNOSIS — Z01812 Encounter for preprocedural laboratory examination: Secondary | ICD-10-CM | POA: Insufficient documentation

## 2019-04-22 DIAGNOSIS — M47812 Spondylosis without myelopathy or radiculopathy, cervical region: Secondary | ICD-10-CM

## 2019-04-22 DIAGNOSIS — M533 Sacrococcygeal disorders, not elsewhere classified: Secondary | ICD-10-CM

## 2019-04-22 DIAGNOSIS — M5416 Radiculopathy, lumbar region: Secondary | ICD-10-CM

## 2019-04-22 NOTE — Progress Notes (Addendum)
Pain Clinic Telemedicine MyChart Video Visit    ---------------------(data below generated by Nickola Major, DO)--------------------    Patient Verification & Telemedicine Consent:    Due to COVID-19 pandemic and a federally declared state of public health emergency, this service is being conducted via video.    I am proceeding with this evaluation at the direct request of the patient.  I have verified this is the correct patient and have obtained verbal consent and written consent from the patient/ surrogate to perform this voluntary telemedicine evaluation (including obtaining history, performing examination and reviewing data provided by the patient).   The patient/ surrogate has the right to refuse this evaluation.  I have explained risks (including potential loss of confidentiality), benefits, alternatives, and the potential need for subsequent face to face care. Patient/ surrogate understands that there is a risk of medical inaccuracies given that our recommendations will be made based on reported data (and we must therefore assume this information is accurate).  Knowing that there is a risk that this information is not reported accurately, and that the telemedicine video, audio, or data feed may be incomplete, the patient agrees to proceed with evaluation and holds [name] harmless knowing these risks. In this evaluation, we will be providing recommendations only.  The ultimate decision to follow, or not follow, these recommendations will be left to the bedside treating/ requesting practitioner.  The patient/ surrogate has been notified that other healthcare professionals (including students, residents and Metallurgist) may be involved in this audio-video evaluation.   All laws concerning confidentiality and patient access to medical records and copies of medical records apply to telemedicine.  The patient/ surrogate has received the [place] Notice of Privacy Practices.  I have reviewed this above  verification and consent paragraph with the patient/ surrogate.  If the patient is not capacitated to understand the above, and no surrogate is available, since this is not an emergency evaluation, the visit will be rescheduled until such time that the patient can consent, or the surrogate is available to consent.    Demographics:   Medical Record #: [id number]   Date: Apr 22, 2019   Patient Name: Stephen [name]   DOB: [birth year]  Age: 68 year old  Sex: male  Location: Home address on file      Evaluator(s):   Willys Salvino was evaluated by me today.    Clinic Location:  [facility]  [address]  [place] [address]    PAIN CLINIC FOLLOW UP NOTE    Primary Care Physician [name]    Chief Complaint: Low back pain     SUBJECTIVE:  This is a 68 year old male with chronic neck and low back pain presents for a follow up visit. Patient with long standing axial low back pain and more recent posterior thigh and gluteal burning pain. Since last encounter, patient had a MRI of lumbar spine which showed severe lumbar stenosis. He has seen Dr. Lelon Huh on 06/23/63 where they considered XLIF L3 thru L5 with laminectomy and PSIF L3 thru L5. Patient reports that the injections have not been helping him as much. He thinks his pain is progressively getting worse and that the conservative options have not provided much of any relief.     Regarding his neck pain, patient mostly had axial neck pain with radiation to left upper shoulder. Pain is worse with neck extension and rotation.  He is resistant to PT since in the past it made his pain worse.     Since the last visit in Pain Clinic patient reports minimal improvement in their pain from pain procedures. Patient reports 30% improvement in their pain with use of their current pain medications.  The patient is doing a home exercise program: walking    They describe their pain as pressure, stabing, stinging, sharp and  squeezing. Patient states their pain is associated with weakness, coldness and tightness. This pain has made it hard for the patient to walk, sleep, sit and exercise.    The patient denies saddle anesthesia or new onset bowel or bladder incontinence.    The patient stated their pain today is Pain Score: 9/10.   Over the past week the patient's pain has been at its worst 10/10, at best 8/10 and averages 9/10.   During the past week, it has interfered with enjoyment of life 10/10 and general activity 10/10.    Pain Treatments  Previous pain procedures, dates, and response include:  RFA Left L3-L5 11/26/18 did not provide much relief   Left L3-5 MBB 06/26/2018  Cervical ESI 01/23/2018   Lumbar ESI 11/24/2018    Past Medical History:   Diagnosis Date   . Chronic suprapubic catheter (CMS-HCC)    . Congenital hydronephrosis    . Gout    . Headache    . Hematuria    . Kidney disease    . Kidney stones    . Major depressive disorder, single episode    . Polyarthropathy or polyarthritis of multiple sites    . Urethral stricture      Past Surgical History:   Procedure Laterality Date   . CT INSERTION OF SUPRAPUBIC CATH  09/25/2015   . NEPHRECTOMY Right 1995   . APPENDECTOMY     . CYSTOSCOPY     . CYSTOSCOPY W/ LASER LITHOTRIPSY     . OTHER SURGICAL HISTORY      Interstim 01/29/2011     Current Outpatient Medications   Medication Sig Dispense Refill   . allopurinol (ZYLOPRIM) 300 MG tablet Take 300 mg by mouth daily.     Marland Kitchen amLODIPINE (NORVASC) 5 MG tablet Take 10 mg by mouth daily.     Marland Kitchen atenolol (TENORMIN) 25 MG tablet Take 25 mg by mouth daily.     . Cetirizine HCl (ZYRTEC PO) 10 mg.      . ciclopirox (LOPROX) 0.77 % GEL Apply topically 2 times daily.     Marland Kitchen docusate sodium (COLACE) 250 MG capsule Take 1 capsule by mouth twice daily while taking narcotic and as needed for constipation. 60 capsule 0   . ibuprofen (MOTRIN) 200 MG tablet Take 200 mg by mouth every 6 hours as needed for Mild Pain (Pain Score 1-3).     .  Ibuprofen-Diphenhydramine Cit (ADVIL PM PO) as needed (1 qhs).     . meloxicam (MOBIC) 7.5 MG tablet       . omeprazole (PRILOSEC) 20 MG capsule Take 20 mg by mouth daily.     . phenazopyridine (PYRIDIUM) 100 MG tablet Take 1 tablet (100 mg) by mouth 3 times daily. 20 tablet 0   . tizanidine (ZANAFLEX) 4 MG capsule Take 4 mg by mouth 3 times daily.     . traMADol (ULTRAM) 50 MG tablet Take 1-2 tablets by mouth every 6 hours as needed for severe pain. 10 tablet 0   . triamcinolone (KENALOG) 0.1 % cream Apply 1  Application topically 2 times daily. Apply a thin layer as directed       No current facility-administered medications for this visit.      Allergies   Allergen Reactions   . Sulfa Drugs Unspecified     Social History     Socioeconomic History   . Marital status: Single     Spouse name: Not on file   . Number of children: Not on file   . Years of education: Not on file   . Highest education level: Not on file   Occupational History   . Not on file   Social Needs   . Financial resource strain: Not on file   . Food insecurity:     Worry: Not on file     Inability: Not on file   . Transportation needs:     Medical: Not on file     Non-medical: Not on file   Tobacco Use   . Smoking status: Never Smoker   . Smokeless tobacco: Never Used   Substance and Sexual Activity   . Alcohol use: Not Currently   . Drug use: Not on file   . Sexual activity: Never   Lifestyle   . Physical activity:     Days per week: Not on file     Minutes per session: Not on file   . Stress: Not on file   Relationships   . Social connections:     Talks on phone: Not on file     Gets together: Not on file     Attends religious service: Not on file     Active member of club or organization: Not on file     Attends meetings of clubs or organizations: Not on file     Relationship status: Not on file   . Intimate partner violence:     Fear of current or ex partner: Not on file     Emotionally abused: Not on file     Physically abused: Not on file      Forced sexual activity: Not on file   Other Topics Concern   . Not on file   Social History Narrative   . Not on file     Family History   Adopted: Yes   Family history unknown: Yes       Review of Systems:  General: no fever  Respiratory: no cough  Remainder as her HPI    Physical Exam:   Vitals: Not obtained given Video Visit  Constitutional: Well-developed, well-nourished, and no distress, cooperative  Psych: alert, awake and oriented, oriented. Speech is clear/ normal. Affect is depressed.  Eyes: Sclera white, conjunctiva clear, lids are without lag. Pupils equal, not pinpoint. .  CV:  No lower extremity edema.  Respiratory:  Breathing easily without tachypnea or bradypnea. Not using accessory muscles.  Musculoskeletal:  Patient identified tender locations identified by patient and shared with examiner: Mid cervical region with pain on rotation and extension.   Provocative maneuvers demonstrated by patient to examiner: None performed due to pain and concern of fall and pain exacerbation.   Neuro:  MS: Awake, alert, fluent speech, memory intact  Motor: Moves all extremities spontaneously against gravity    Labs and Imaging:  MRI of Lumbar spine 01/14/19  "IMPRESSION:  Compression of the cauda equina at L4-5, in the setting of severe spinal canal narrowing and spondylolisthesis. There is also contact of the exiting L4 nerve root and traversing L5 nerve roots at  this level.    At L3-4, severe spinal canal narrowing with preservation of the CSF space.    The right kidney is absent."    Assessment and Plan:  Encounter Diagnoses   Name Primary?   . Pre-procedure lab exam Yes   . Lumbar radiculopathy    . Sacroiliac joint dysfunction    . Cervical spondylosis without myelopathy        This is a 68 year old male with chronic neck and low back pain due to cervical spondylosis and lumbar stenosis with claudications and radiculopathy who returns for follow up of LBP>neck pain. Recent MRI of lumbar spine, showed  compression of the cauda equina at L4-L5 and severe spinal narrowing at L3-L4. Patient has seen Dr. Lelon Huh on 08/25/27 where they considered XLIF L3 thru L5 with laminectomy and PSIF L3 thru L5. We think that it is less likely that the patient will benefit from continued lumbar injections and thus he should proceed with the surgery as he desires. Regarding patient's neck pain, patient does have marked cervical spondylosis on imaging with facetogenic pain referral patterns. As such, we will order for left C4-C6 Mbb with further consideration for RFA.     PLAN:  - Will order for left C4-C6 Mbb with further consideration of RFA with adequate response.  - Pt to undergo lumbar surgery with Dr. Earnest Conroy. We will follow up with his back pain after his surgery.      Regarding the above interventions, the patient has been educated regarding the risks (including bleeding, infection, increased pain, nerve damage, or allergic reaction), benefits, and alternatives. The patient states he/she understands and is eager to proceed.    Follow-up: after procedure    Total duration of encounter spent in pre-visit (reviewing last visit, reviewing Care Everywhere and reviewing recent images), intra-visit (updating relevant history, performing a video-based physical exam, creating a treatment plan and medical discussion with patient), and post-visit (note completion, placing of orders and coordination of care) excluding separately reportable services/procedures: 40 minutes.      Nickola Major, DO  [facility], was involved in the care of this patient.  I was responsible for all key aspects of the evaluation, treatment planning and discussion with the patient.

## 2019-05-24 ENCOUNTER — Other Ambulatory Visit: Payer: Self-pay

## 2019-05-27 ENCOUNTER — Telehealth: Payer: Self-pay | Admitting: Physician Assistant

## 2019-05-27 NOTE — Telephone Encounter (Signed)
Pending

## 2019-05-28 ENCOUNTER — Ambulatory Visit: Payer: Medicare Other | Attending: Family Medicine

## 2019-05-28 ENCOUNTER — Other Ambulatory Visit
Admit: 2019-05-28 | Discharge: 2019-05-28 | Disposition: A | Discharge status: Home / Self Care | Payer: Medicare Other | Attending: Anesthesiology | Admitting: Anesthesiology

## 2019-05-28 DIAGNOSIS — Z1159 Encounter for screening for other viral diseases: Secondary | ICD-10-CM | POA: Insufficient documentation

## 2019-05-28 DIAGNOSIS — Z01818 Encounter for other preprocedural examination: Secondary | ICD-10-CM

## 2019-05-28 DIAGNOSIS — Z01812 Encounter for preprocedural laboratory examination: Secondary | ICD-10-CM | POA: Insufficient documentation

## 2019-05-28 NOTE — Patient Instructions (Signed)
•Isolation information for Patients  (updated 04/22/2019)    Your health care provider will evaluate whether you can be cared for at home. If it is determined that you do not need hospitalization and can be isolated at home, you will be monitored by your health care provider.     You should follow the prevention steps below and even if your test is negative follow these guidelines including waiting to leave home until you are no longer contagious as per instructions at end of this document. Please follow any additional instructions for return to your workplace if you are an essential worker.    Contact our dedicated nurse line [phone] if you are noting worsening respiratory symptoms and need advice. This line is open 8 am -5 pm 7 days a week.  If sudden and severe worsening in symptoms please call 911 and let them know you are being tested or are COVID-19 positive.        Stay home except to get medical care  [place] has a Health order for quarantine and it is a misdemeanor if you are not following:  https://www.[place].gov/content/dam/sdc/hhsa/programs/phs/Epidemiology/covid19/HealthOfficerOrder-Isolation.pdf  • You should do no activities outside your home, except for getting medical care. Do not go to work, school, or public areas. Do not use public transportation, ride-sharing, or taxis.  • If you have a medical appointment, call the healthcare provider and tell them that you have or may have COVID-19. This will help the healthcare provider’s office take steps to keep other people from getting infected or exposed.  ; Have all essential items (eg groceries) delivered to your home and left at your doorstep. If you need assistance with this, call 211 to learn about available services.    Separate yourself from other people and animals in your home  • People: As much as possible, stay in a specific room and away from other people in your home. If available, you should use a separate  bathroom.  • Animals: Restrict contact with pets and other animals while you are sick with COVID-19, just like you would around other people. Avoid petting, snuggling, being kissed or licked, and sharing food with pets. When possible, have another member of your household care for your animals while you are sick. If you must care for your pet or be around animals while you are sick, wash your hands before and after you interact with them and wear a facemask.     Notify contacts of your illness if you test positive:  • Because people infected with COVID can spread the illness before they develop symptoms, you need to make a list of all people you’ve been in contact with from 48 hours prior to your first symptom through until you started home isolation.  • Contact the people on this list and inform them of your COVID19 diagnosis or potential diagnosis if you have symptoms but are not tested.   • Inform all of your contacts that they need to quarantine themselves in their homes for 14 days from the last point of contact.  They may leave their homes only to get medical care.    • If any of your contacts are an essential worker, they should contact their employer about their return to work policy.  If their employer does not have a policy, they may follow the CDC guidelines for exposed essential workers:  https://www.cdc.gov/coronavirus/2019-ncov/community/critical-workers/implementing-safety-practices.html     Wear a facemask   • You should wear a facemask when you are   around other people (e.g., sharing a room or vehicle) or pets and before you enter a health care provider’s office. If you are unable to wear a facemask (for example, because it causes trouble breathing), then people who live with you should not stay in the same room with you, or they should wear a facemask if they enter your room.    Cover your coughs and sneezes  • Cover your mouth and nose with a tissue when you cough or sneeze. Throw used tissues in a  lined trash can; immediately wash your hands with soap and water for at least 20 seconds or clean your hands with an alcohol-based hand sanitizer that contains 60 to 95% alcohol, covering all surfaces of your hands and rubbing them together until they feel dry. Soap and water should be used preferentially if hands are visibly dirty.    Clean your hands often  • Wash your hands often with soap and water for at least 20 seconds or clean your hands with an alcohol-based hand sanitizer that contains 60 to 95% alcohol, covering all surfaces of your hands and rubbing them together until they feel dry. Soap and water should be used preferentially if hands are visibly dirty. Avoid touching your eyes, nose, and mouth with unwashed hands.    Avoid sharing personal household items  • You should not share dishes, drinking glasses, cups, eating utensils, towels, or bedding with other people or pets in your home. After using these items, they should be washed thoroughly with soap and water.    Clean all “high-touch” surfaces everyday  • High touch surfaces include counters, tabletops, doorknobs, bathroom fixtures, toilets, phones, keyboards, tablets, and bedside tables. Also, clean any surfaces that may have blood, stool, or bodily fluids on them. Use a household cleaning spray or wipe, according to the label instructions. Labels contain instructions for safe and effective use of the cleaning product including precautions you should take when applying the product, such as wearing gloves and making sure you have good ventilation during use of the product.    Monitor your symptoms  • Seek prompt medical attention if your illness is worsening (e.g., difficulty breathing). Before seeking care, call your health care provider and tell them that you have, or are being evaluated for, COVID-19. Put on a facemask if you have one before you enter the facility. These steps will help the health care provider’s office to keep other people in  the office or waiting room from being infected or exposed.   Persons who are placed under active monitoring or facilitated self-monitoring should follow instructions provided by their local health department or occupational health professionals, as appropriate.  • If you have a medical emergency and need to call 911, notify the dispatch personnel that you have, or are being evaluated for COVID-19. If possible, put on a facemask before emergency medical services arrive.    Discontinuing home isolation  Patients with confirmed COVID-19 or with respiratory symptoms and a negative COVID-19 test should remain under home isolation precautions until the following three things have happened:    1. You have had no fever for at least 72 hours (that is three full days of no fever without the use medicine that reduces fevers)  AND  2. Other symptoms have improved (for example, when your cough or shortness of breath have improved)  AND  3. At least 10 days have passed since your symptoms first appeared    If you are an essential worker,   please contact your employer after you meet the above criteria to discuss their specific return to work policy. Please abide by their policies.    Please go to https://www.cdc.gov/coronavirus/2019-ncov/if-you-are-sick/index.html for additional information.

## 2019-05-28 NOTE — Interdisciplinary (Signed)
Holly Cate ,MA  Verified Patient with two identifiers.  Obtained nasal specimen from patient and submitted to lab. Patient given self care islation instructions to follow while waiting for test results.

## 2019-05-29 ENCOUNTER — Encounter (INDEPENDENT_AMBULATORY_CARE_PROVIDER_SITE_OTHER): Payer: Self-pay

## 2019-05-29 ENCOUNTER — Telehealth (HOSPITAL_BASED_OUTPATIENT_CLINIC_OR_DEPARTMENT_OTHER): Payer: Self-pay | Admitting: Orthopaedic Surgery of the Spine

## 2019-05-29 DIAGNOSIS — Z01818 Encounter for other preprocedural examination: Secondary | ICD-10-CM

## 2019-05-29 LAB — COVID-19 CORONAVIRUS DETECTION ASSAY AT ~~LOC~~ LAB: COVID-19 Coronavirus Result: NOT DETECTED

## 2019-05-29 NOTE — Telephone Encounter (Signed)
Left message for patient to call back to schedule surgery with Dr. Zlomislic.

## 2019-05-30 ENCOUNTER — Other Ambulatory Visit (HOSPITAL_BASED_OUTPATIENT_CLINIC_OR_DEPARTMENT_OTHER): Payer: Self-pay | Admitting: Anesthesiology

## 2019-05-30 DIAGNOSIS — Z01812 Encounter for preprocedural laboratory examination: Secondary | ICD-10-CM

## 2019-05-31 ENCOUNTER — Ambulatory Visit (INDEPENDENT_AMBULATORY_CARE_PROVIDER_SITE_OTHER): Payer: Managed Care, Other (non HMO)

## 2019-05-31 ENCOUNTER — Other Ambulatory Visit: Payer: Self-pay

## 2019-05-31 ENCOUNTER — Encounter (HOSPITAL_BASED_OUTPATIENT_CLINIC_OR_DEPARTMENT_OTHER): Admission: RE | Disposition: A | Discharge status: Home Health | Payer: Self-pay | Attending: Anesthesiology

## 2019-05-31 ENCOUNTER — Ambulatory Visit
Admission: RE | Admit: 2019-05-31 | Discharge: 2019-05-31 | Disposition: A | Discharge status: Home / Self Care | Payer: Medicare Other | Attending: Anesthesiology | Admitting: Anesthesiology

## 2019-05-31 ENCOUNTER — Ambulatory Visit: Payer: Medicare Other

## 2019-05-31 DIAGNOSIS — I351 Nonrheumatic aortic (valve) insufficiency: Secondary | ICD-10-CM | POA: Diagnosis not present

## 2019-05-31 DIAGNOSIS — Z01812 Encounter for preprocedural laboratory examination: Secondary | ICD-10-CM

## 2019-05-31 DIAGNOSIS — F329 Major depressive disorder, single episode, unspecified: Secondary | ICD-10-CM | POA: Insufficient documentation

## 2019-05-31 DIAGNOSIS — Z882 Allergy status to sulfonamides status: Secondary | ICD-10-CM | POA: Insufficient documentation

## 2019-05-31 DIAGNOSIS — M109 Gout, unspecified: Secondary | ICD-10-CM | POA: Insufficient documentation

## 2019-05-31 DIAGNOSIS — Z87442 Personal history of urinary calculi: Secondary | ICD-10-CM | POA: Insufficient documentation

## 2019-05-31 DIAGNOSIS — N35919 Unspecified urethral stricture, male, unspecified site: Secondary | ICD-10-CM | POA: Insufficient documentation

## 2019-05-31 DIAGNOSIS — M47812 Spondylosis without myelopathy or radiculopathy, cervical region: Secondary | ICD-10-CM | POA: Insufficient documentation

## 2019-05-31 SURGERY — PAIN INJECTION, PARAVERT FACET CERVICAL OR THORACIC 1ST
Anesthesia: Local | Laterality: Left

## 2019-05-31 MED ORDER — BUPIVACAINE HCL (PF) 0.5 % IJ SOLN
INTRAMUSCULAR | Status: AC
Start: 2019-05-31 — End: 2019-05-31
  Filled 2019-05-31: qty 10

## 2019-05-31 MED ORDER — BUPIVACAINE HCL (PF) 0.5 % IJ SOLN
INTRAMUSCULAR | Status: DC | PRN
Start: 2019-05-31 — End: 2019-05-31
  Administered 2019-05-31: 10:00:00 1.5 mL

## 2019-05-31 SURGICAL SUPPLY — 8 items
APPLICATOR CHLORAPREP 3ML, CLEAR (Misc Medical Supply) ×2
GLOVE BIOGEL PI ULTRATOUCH SIZE 7 (Gloves/gowns)
GLOVE BIOGEL PI ULTRATOUCH SIZE 7.5 (Gloves/gowns)
GLOVE BIOGEL PI ULTRATOUCH SIZE 8 (Gloves/gowns)
MARKER SECURELINE SURG SKIN (Misc Medical Supply) ×2 IMPLANT
NEEDLE SPINE QUINCKE 23G X 3.5" (Needles/punch/cannula/biopsy) IMPLANT
NEEDLE SPINE QUINCKE 25G X 3.5" (Needles/punch/cannula/biopsy) IMPLANT
TRAY SINGLE SHOT EPIDURAL (Procedure Packs/kits) ×2 IMPLANT

## 2019-05-31 NOTE — H&P (Signed)
Ambulatory Surgery/Invasive Procedure History and Physical      Primary Care Physician [name]    Chief Complaint:  neck pain     68 year old male here for a left C4-6 MBB diagnostic.    BP (P) 120/70   Pulse (!) (P) 46   Resp (P) 16   SpO2 (P) 99%     Past Medical History:   Diagnosis Date   . Chronic suprapubic catheter (CMS-HCC)    . Congenital hydronephrosis    . Gout    . Headache    . Hematuria    . Kidney disease    . Kidney stones    . Major depressive disorder, single episode    . Polyarthropathy or polyarthritis of multiple sites    . Urethral stricture        Allergies   Allergen Reactions   . Sulfa Drugs Unspecified       No current facility-administered medications for this encounter.        I have reviewed the past medical history, allergies and current medications as documented in the electronic health record.      Physical Exam       Can this patient make their own healthcare decisions?  Yes    Chest:  Breaths easily     Heart:  RRR    Abdomen:  Soft    Pain Management Needs/Options discussed.    No Advanced Directives.      Resuscitative Status:  Full Code, Full Care    Diagnosis:    ICD-10-CM ICD-9-CM    1. Pre-procedure lab exam Z01.812 V72.63     Added automatically from request for surgery [phone]   2. Cervical spondylosis without myelopathy M47.812 721.0     Added automatically from request for surgery [id number]   3. Pre-procedure lab exam Z01.812 V72.63        Procedure: Diagnostic Cervical medial branch block    This is the first injection of this type.     Discussed Risks, Benefits, and Alternatives to procedure.  Questions answered.  Patient voiced understanding and wished to proceed. Consent Signed.    See procedure note of same date.

## 2019-05-31 NOTE — Discharge Instructions (Signed)
Patient verbalized understanding of below instructions    Procedure Done Today: Left cervical medial branch block of C4, C5, C6    Follow up: complete a pain diary and make a TeleHealth video appointment in the pain clinic with Dr [name] in 1-2 weeks    Post Procedure Instructions  Continue present medication unless otherwise indicated.  Resume your normal diet after being discharge.  Ice pack to treatment site (no more than 20 minutes at a time) if needed for pain relief and/or muscle spasm.  Avoid strenuous activities and driving until tomorrow morning.  If you have a band-aid dressing, you may remove it tomorrow morning.  Resume normal activities tomorrow morning, unless otherwise directed.  No submersion in water for 24 hrs.  If your next appointment is at the Pain Clinic, then please call [phone] to make an appointment.  If your next appointment is another procedure, then please call [phone] to make an appointment.    Call the [facility] at [phone] and ask for the Regional Anesthesia Team on-call for ANY signs of:  Temperature above 101.5 F  Redness or drainage at the treatment site  Severe, uncontrollable headache    Call 911 IN CASE OF EMERGENCY

## 2019-05-31 NOTE — Op Note (Signed)
Procedure Note, [facility]    Preoperative Diagnosis: Cervical Spondylosis without myelopathy    Postoperative Diagnosis: Cervical Spondylosis without myelopathy    Procedure: 1. Cervical medial branch nerve blocks. Levels performed:  Left C4 medial branch  Left C5 medial branch  Left C6 medial branch    2.  Fluoroscopy for needle guidance.    Surgeon/Staff: Sunday Corn, MD  Assistant: Alcide Clever, M.D.    Anesthesia :none                        Indications: Cervical Spondylosis without myelopathy    Procedure in detail:  Written informed consent was obtained.  The chart was reviewed, questions were answered and the patient wished to proceed. The patient had no contraindications to the procedure.  Vital signs were stable. Standard monitoring was applied.  The patient, the procedure nurse, and the physician confirmed the site of injection after an official "time out."  The skin over the injection site was also marked and confirmed as the site of the injection.     Localization Time Out:  An additional intraoperative timeout, specifically to confirm accurate localization, was conducted by the attending physician.     The patient was placed in the prone position.  The skin was prepped with chlorhexidine and sterile drapes were applied.     A 25 gauge 3.5 inch needle was inserted under frontal, lateral and oblique fluoroscopic projections using a lateral approach on the left .  Each needle tip was advanced to contact the centroid of the lateral articular pillar at each respective level.   Aspiration of each needle was negative for blood, CSF and paresthesia prior to injection.    The nerve blocks were then performed by injecting 0.5 ml of local anesthetic solution through each needle.  The local anesthetic solution consisted of  Bupivicaine (0.5%)  0.25 mL.  The needles were removed.  A sterile bandage was applied.  The patient did not experience any untoward hemodynamic or neurologic sequelae. The  attending physician was present during all critical points of the procedure.    The patient was transferred to the recovery area. Post operative instructions were explained and given to the patient.  The patient was discharged in stable health

## 2019-06-11 ENCOUNTER — Encounter (HOSPITAL_BASED_OUTPATIENT_CLINIC_OR_DEPARTMENT_OTHER): Payer: Self-pay | Admitting: Physician Assistant

## 2019-06-11 ENCOUNTER — Ambulatory Visit: Payer: Medicare Other | Attending: Physician Assistant | Admitting: Physician Assistant

## 2019-06-11 DIAGNOSIS — Z01812 Encounter for preprocedural laboratory examination: Secondary | ICD-10-CM | POA: Insufficient documentation

## 2019-06-11 DIAGNOSIS — M47812 Spondylosis without myelopathy or radiculopathy, cervical region: Secondary | ICD-10-CM | POA: Insufficient documentation

## 2019-06-11 DIAGNOSIS — M5412 Radiculopathy, cervical region: Secondary | ICD-10-CM

## 2019-06-11 NOTE — Progress Notes (Signed)
PAIN CLINIC FOLLOW UP TELEMEDICINE NOTE    Primary Care Physician [name]    SUBJECTIVE:  This is a 68 year old male presenting for a tele video visit with chronic neck and low back pain following up for re-evaluation after undergoing a left C4-C6 diagnostic MBB on 05/31/19 and reports  80% relief of left cervical neck pain. He also reports that there was improvement in function and mobility. He states increased ROM. Continues to have pain with straining and sleeping. Ordered a new pillow and is hopeful that will be helpful.    Patient has been compliant with social isolation guidelines. Denies any cold or flu like symptoms.    Pain Treatments  Previous pain procedures and response include:   -05/31/19 left C4-C 6 diagnostic MBB (80% relief)  -RFA Left L3-L5 11/26/18(did not provide much relief)  -Left L3-5 MBB 06/26/2018  -Cervical ESI 01/23/2018   -Lumbar ESI 11/24/2018    Medications:  Meloxicam (No longer taking)  Tizanidine 6mg  BID      Current Pain Description  Patient states that their left neck and low back is most troublesome.    The patient stated their pain today is 5/10.     Patient's significant social history is:   Social History     Socioeconomic History   . Marital status: Single     Spouse name: Not on file   . Number of children: Not on file   . Years of education: Not on file   . Highest education level: Not on file   Occupational History   . Not on file   Social Needs   . Financial resource strain: Not on file   . Food insecurity:     Worry: Not on file     Inability: Not on file   . Transportation needs:     Medical: Not on file     Non-medical: Not on file   Tobacco Use   . Smoking status: Never Smoker   . Smokeless tobacco: Never Used   Substance and Sexual Activity   . Alcohol use: Not Currently   . Drug use: Not on file   . Sexual activity: Never   Lifestyle   . Physical activity:     Days per week: Not on file     Minutes per session: Not on file   . Stress: Not on file   Relationships   .  Social connections:     Talks on phone: Not on file     Gets together: Not on file     Attends religious service: Not on file     Active member of club or organization: Not on file     Attends meetings of clubs or organizations: Not on file     Relationship status: Not on file   . Intimate partner violence:     Fear of current or ex partner: Not on file     Emotionally abused: Not on file     Physically abused: Not on file     Forced sexual activity: Not on file   Other Topics Concern   . Not on file   Social History Narrative   . Not on file       Review of Systems:  General: No fever  Respiratory: No cough/wheezing  Gastrointestinal: No nausea/vomiting  Neurological: Paresthesias  Musculoskeletal: Joint and muscle pain    Past medical history is significant for:   has a past medical history of  Chronic suprapubic catheter (CMS-HCC), Congenital hydronephrosis, Gout, Headache, Hematuria, Kidney disease, Kidney stones, Major depressive disorder, single episode, Polyarthropathy or polyarthritis of multiple sites, and Urethral stricture.    Current medications include:  Current Outpatient Medications   Medication Sig Dispense Refill   . allopurinol (ZYLOPRIM) 300 MG tablet Take 300 mg by mouth daily.     Marland Kitchen amLODIPINE (NORVASC) 5 MG tablet Take 10 mg by mouth daily.     Marland Kitchen atenolol (TENORMIN) 25 MG tablet Take 25 mg by mouth daily.     . Cetirizine HCl (ZYRTEC PO) 10 mg.      . ciclopirox (LOPROX) 0.77 % GEL Apply topically 2 times daily.     Marland Kitchen docusate sodium (COLACE) 250 MG capsule Take 1 capsule by mouth twice daily while taking narcotic and as needed for constipation. 60 capsule 0   . ibuprofen (MOTRIN) 200 MG tablet Take 200 mg by mouth every 6 hours as needed for Mild Pain (Pain Score 1-3).     . Ibuprofen-Diphenhydramine Cit (ADVIL PM PO) as needed (1 qhs).     . meloxicam (MOBIC) 7.5 MG tablet       . omeprazole (PRILOSEC) 20 MG capsule Take 20 mg by mouth daily.     . phenazopyridine (PYRIDIUM) 100 MG tablet Take  1 tablet (100 mg) by mouth 3 times daily. 20 tablet 0   . tizanidine (ZANAFLEX) 4 MG capsule Take 4 mg by mouth 3 times daily.     . traMADol (ULTRAM) 50 MG tablet Take 1-2 tablets by mouth every 6 hours as needed for severe pain. 10 tablet 0   . triamcinolone (KENALOG) 0.1 % cream Apply 1 Application topically 2 times daily. Apply a thin layer as directed       No current facility-administered medications for this visit.        Patient's current allergies are:  Sulfa drugs    OBJECTIVE:  Physical Exam  General: alert, no distress, cooperative  Mental Status:  alert, oriented x 3. Speech is clear/ normal  Affect: euthymic  Eyes: Sclera white, conjunctiva clear, lids are without lag. Pupils not pinpoint.  CV:  No visible extremity edema.  Respiratory:  Breathing easily without tachypnea or bradypnea.  GI/Abdomen: Not-distended.  Skin: No rash visible  Musculoskeletal:    Neck: Patient identified tender locations and shared with examiner: Mid cervical region with pain on rotation and extension   Upper Ext: wnls   Lower Ext: wnls  Neurosensory: Motor exam: antigravity strength throughout.     ASSESSMENT  Encounter Diagnoses   Name Primary?   . Cervical spondylosis    . Pre-procedure lab exam    . Cervical spondylosis without myelopathy    . Cervical radiculopathy      This is a 68 year old male presenting for a tele-video visit with chronic neck and low back pain due to cervical spondylosis and lumbar stenosis with claudications and radiculopathy following up for re-evaluation after undergoing a left C4-C6 diagnostic MBB and reports 80% relief in pain and improvement in function and mobility.  Recommendation is to undergo a left C4-C6 RFA.  The risks, benefits, and alternatives are discussed (including coming to hospital en light of COVID-19) and patient is eager to proceed.    Patient aware that examination and assessment may be limited due to the nature of this telemedicine consultation visit and the inability to do  a hands-on physical examination.    PLAN  Medication Plan:   -No changes  Procedure Plan:   -left C4-C6 RFA    Follow-up: One month post procedure for re-evaluation.    Dr. Trinna Post was present in the clinic as the supervising physician and was immediately available.       ---------------------(data below generated by Elvera Bicker, PA)--------------------    Patient Verification & Telemedicine Consent:    I am proceeding with this evaluation at the direct request of the patient.  I have verified this is the correct patient and have obtained verbal consent and written consent from the patient/ surrogate to perform this voluntary telemedicine evaluation (including obtaining history, performing examination and reviewing data provided by the patient).   The patient/ surrogate has the right to refuse this evaluation.  I have explained risks (including potential loss of confidentiality), benefits, alternatives, and the potential need for subsequent face to face care. Patient/ surrogate understands that there is a risk of medical inaccuracies given that our recommendations will be made based on reported data (and we must therefore assume this information is accurate).  Knowing that there is a risk that this information is not reported accurately, and that the telemedicine video, audio, or data feed may be incomplete, the patient agrees to proceed with evaluation and holds [name] harmless knowing these risks. In this evaluation, we will be providing recommendations only.  The ultimate decision to follow, or not follow, these recommendations will be left to the bedside treating/ requesting practitioner.  The patient/ surrogate has been notified that other healthcare professionals (including students, residents and Metallurgist) may be involved in this audio-video evaluation.   All laws concerning confidentiality and patient access to medical records and copies of medical records apply to telemedicine.  The patient/  surrogate has received the [place] Notice of Privacy Practices.  I have reviewed this above verification and consent paragraph with the patient/ surrogate.  If the patient is not capacitated to understand the above, and no surrogate is available, since this is not an emergency evaluation, the visit will be rescheduled until such time that the patient can consent, or the surrogate is available to consent.    Demographics:   Medical Record #: [id number]   Date: June 11, 2019   Patient Name: Stephen [name]   DOB: [birth year]  Age: 51 year old  Sex: male  Location: Home address on file      Evaluator(s):   Brit Wernette was evaluated by me today.    Clinic Location:  [facility]   [facility]  [address]  [place] [address]    20 minutes of what became a 30 minute appointment was spent face to face with patient/caregiver in coordinating care and counseling for the below issues.

## 2019-06-23 ENCOUNTER — Other Ambulatory Visit: Payer: Self-pay

## 2019-06-26 ENCOUNTER — Other Ambulatory Visit: Payer: Self-pay

## 2019-06-28 ENCOUNTER — Ambulatory Visit: Payer: Medicare Other | Attending: Physician Assistant

## 2019-06-28 ENCOUNTER — Other Ambulatory Visit (HOSPITAL_BASED_OUTPATIENT_CLINIC_OR_DEPARTMENT_OTHER): Payer: Self-pay | Admitting: Anesthesiology

## 2019-06-28 DIAGNOSIS — Z01812 Encounter for preprocedural laboratory examination: Secondary | ICD-10-CM

## 2019-06-28 DIAGNOSIS — Z1159 Encounter for screening for other viral diseases: Secondary | ICD-10-CM | POA: Insufficient documentation

## 2019-06-28 DIAGNOSIS — Z01818 Encounter for other preprocedural examination: Secondary | ICD-10-CM | POA: Insufficient documentation

## 2019-06-28 DIAGNOSIS — M47812 Spondylosis without myelopathy or radiculopathy, cervical region: Secondary | ICD-10-CM

## 2019-06-28 NOTE — Patient Instructions (Signed)
Isolation and Result Information for [facility] Drive Up Testing  (updated 06/18/2019)     PLEASE KEEP THIS DOCUMENT UNTIL YOU RECEIVE YOUR RESULTS     RESULT INFORMATION:  If your COVID-19 Coronavirus Assay swab is resulted as Detected (Positive) you will receive a call from a Provider or Team that ordered your test, you will receive follow up monitoring for 10 days.      If your COVID-19 Coronavirus Assay swab is resulted as Not Detected (Negative) this suggests that the collected specimen from your nose did not have genetic material consistent with COVID-19. There is a small chance that the test could be falsely negative. Your negative results will be released only to MyChart, if you do not have MyChart we will communicate the results to you by telephone.      More information:  Your health care provider will evaluate whether you can be cared for at home. If it is determined that you do not need hospitalization and can be isolated at home, you will be monitored by your health care provider.      You should follow the prevention steps below and even if your test is negative follow these guidelines including waiting to leave home until you are no longer contagious as per instructions at end of this document.      Contact our dedicated nurse line [phone] if you are noting worsening respiratory symptoms and need advice. This line is open 8 am -5 pm 7 days a week.  If sudden and severe worsening in symptoms please call 911 and let them know you are being tested or are COVID-19 positive.         Stay home except to get medical care  [place] has a Health order for quarantine and it is a misdemeanor if you are not following:  https://www.[place].gov/content/dam/sdc/hhsa/programs/phs/Epidemiology/covid19/HealthOfficerOrder-Isolation.pdf  · You should do no activities outside your home, except for getting medical care. Do not go to work, school, or public areas. Do not use public transportation,  ride-sharing, or taxis.  · If you have a medical appointment, call the healthcare provider and tell them that you have or may have COVID-19. This will help the healthcare provider's office take steps to keep other people from getting infected or exposed.  · Have all essential items (e.g. groceries) delivered to your home and left at your doorstep. If you need assistance with this, call 211 to learn about available services.     Separate yourself from other people and animals in your home  · People: As much as possible, stay in a specific room and away from other people in your home. If available, you should use a separate bathroom.  · Animals: Restrict contact with pets and other animals while you are sick with COVID-19, just like you would around other people. Avoid petting, snuggling, being kissed or licked, and sharing food with pets. When possible, have another member of your household care for your animals while you are sick. If you must care for your pet or be around animals while you are sick, wash your hands before and after you interact with them and wear a facemask.      Notify contacts of your illness if you test positive:  · Because people infected with COVID can spread the illness before they develop symptoms, you need to make a list of all people you've been in contact with from 48 hours prior to your first symptom through until you started home isolation.  ·   Contact the people on this list and inform them of your COVID19 diagnosis or potential diagnosis if you have symptoms but are not tested.   · Inform all of your contacts that they need to quarantine themselves in their homes for 14 days from the last point of contact.  They may leave their homes only to get medical care.    · If any of your contacts are an essential worker, they should contact their employer about their return to work policy.  If their employer does not have a policy, they may follow the CDC guidelines for exposed essential  workers:  https://www.cdc.gov/coronavirus/2019-ncov/community/critical-workers/implementing-safety-practices.html      Wear a facemask   · You should wear a facemask when you are around other people (e.g., sharing a room or vehicle) or pets and before you enter a health care provider's office. If you are unable to wear a facemask (for example, because it causes trouble breathing), then people who live with you should not stay in the same room with you, or they should wear a facemask if they enter your room.     Cover your coughs and sneezes  · Cover your mouth and nose with a tissue when you cough or sneeze. Throw used tissues in a lined trash can; immediately wash your hands with soap and water for at least 20 seconds or clean your hands with an alcohol-based hand sanitizer that contains 60 to 95% alcohol, covering all surfaces of your hands and rubbing them together until they feel dry. Soap and water should be used preferentially if hands are visibly dirty.     Clean your hands often  · Wash your hands often with soap and water for at least 20 seconds or clean your hands with an alcohol-based hand sanitizer that contains 60 to 95% alcohol, covering all surfaces of your hands and rubbing them together until they feel dry. Soap and water should be used preferentially if hands are visibly dirty. Avoid touching your eyes, nose, and mouth with unwashed hands.     Avoid sharing personal household items  · You should not share dishes, drinking glasses, cups, eating utensils, towels, or bedding with other people or pets in your home. After using these items, they should be washed thoroughly with soap and water.     Clean all “high-touch” surfaces everyday  · High touch surfaces include counters, tabletops, doorknobs, bathroom fixtures, toilets, phones, keyboards, tablets, and bedside tables. Also, clean any surfaces that may have blood, stool, or bodily fluids on them. Use a household cleaning spray or wipe, according to  the label instructions. Labels contain instructions for safe and effective use of the cleaning product including precautions you should take when applying the product, such as wearing gloves and making sure you have good ventilation during use of the product.     Monitor your symptoms  · Seek prompt medical attention if your illness is worsening (e.g., difficulty breathing). Before seeking care, call your health care provider and tell them that you have, or are being evaluated for, COVID-19. Put on a facemask if you have one before you enter the facility. These steps will help the health care provider's office to keep other people in the office or waiting room from being infected or exposed.   Persons who are placed under active monitoring or facilitated self-monitoring should follow instructions provided by their local health department or occupational health professionals, as appropriate.  · If you have a medical emergency and need to call   911, notify the dispatch personnel that you have, or are being evaluated for COVID-19. If possible, put on a facemask before emergency medical services arrive.     Discontinuing home isolation  Patients with confirmed COVID-19 or with respiratory symptoms and a negative COVID-19 test should remain under home isolation precautions until the following three things have happened:     · You have had no fever for at least 72 hours (that is three full days of no fever without the use medicine that reduces fevers)  AND  · Other symptoms have improved (for example, when your cough or shortness of breath have improved)  AND  · At least 10 days have passed since your symptoms first appeared     · You should continue to use personal protective equipment.   · If you have any symptoms or questions please contact your Health Care Provider or Primary Care Physician for further guidance.  · Patients established with [facility] PCP please contact your PCP   · For those that do not have a [facility] PCP but  would like additional guidance from [facility] please schedule an express care video visit by contacting [phone] [email or web address]     Please go to https://www.cdc.gov/coronavirus/2019-ncov/if-you-are-sick/index.html for additional information.    If you are an essential worker, please contact your employer after you meet the above criteria to discuss their specific return to work policy. Please abide by their policies.      *For symptomatic [employer] employee please follow up with supervisor for return to work information. Please go to [email or web address] for additional information.

## 2019-06-28 NOTE — Interdisciplinary (Signed)
Stephen Orizaga MA Verified Patient with two identifiers.  Obtained nasal specimen from patient and submitted to lab. Patient given self care islation instructions to follow while waiting for test results.

## 2019-06-29 LAB — COVID-19 CORONAVIRUS DETECTION ASSAY AT ~~LOC~~ LAB: COVID-19 Coronavirus Result: NOT DETECTED

## 2019-07-01 ENCOUNTER — Ambulatory Visit
Admission: RE | Admit: 2019-07-01 | Discharge: 2019-07-01 | Disposition: A | Discharge status: Home / Self Care | Payer: Medicare Other | Attending: Anesthesiology | Admitting: Anesthesiology

## 2019-07-01 ENCOUNTER — Encounter (HOSPITAL_BASED_OUTPATIENT_CLINIC_OR_DEPARTMENT_OTHER): Admission: RE | Disposition: A | Discharge status: Home Health | Payer: Self-pay | Attending: Anesthesiology

## 2019-07-01 ENCOUNTER — Ambulatory Visit: Payer: Medicare Other

## 2019-07-01 DIAGNOSIS — F329 Major depressive disorder, single episode, unspecified: Secondary | ICD-10-CM | POA: Insufficient documentation

## 2019-07-01 DIAGNOSIS — M47812 Spondylosis without myelopathy or radiculopathy, cervical region: Secondary | ICD-10-CM | POA: Insufficient documentation

## 2019-07-01 DIAGNOSIS — M109 Gout, unspecified: Secondary | ICD-10-CM | POA: Insufficient documentation

## 2019-07-01 DIAGNOSIS — M130 Polyarthritis, unspecified: Secondary | ICD-10-CM | POA: Insufficient documentation

## 2019-07-01 DIAGNOSIS — Z880 Allergy status to penicillin: Secondary | ICD-10-CM | POA: Insufficient documentation

## 2019-07-01 DIAGNOSIS — Z882 Allergy status to sulfonamides status: Secondary | ICD-10-CM | POA: Insufficient documentation

## 2019-07-01 DIAGNOSIS — Z87442 Personal history of urinary calculi: Secondary | ICD-10-CM | POA: Insufficient documentation

## 2019-07-01 DIAGNOSIS — Z960 Presence of urogenital implants: Secondary | ICD-10-CM | POA: Insufficient documentation

## 2019-07-01 DIAGNOSIS — Z87448 Personal history of other diseases of urinary system: Secondary | ICD-10-CM | POA: Insufficient documentation

## 2019-07-01 SURGERY — PAIN RFA PARAVERT FACET, CERVICAL OR THORACIC, SINGLE
Anesthesia: Local | Laterality: Left

## 2019-07-01 MED ORDER — VITAMIN D PO
125.00 mg | Freq: Every day | ORAL | Status: DC
Start: ? — End: 2022-12-01

## 2019-07-01 MED ORDER — DEXAMETHASONE SOD PHOSPHATE PF 10 MG/ML IJ SOLN
INTRAMUSCULAR | Status: AC
Start: 2019-07-01 — End: 2019-07-01
  Filled 2019-07-01: qty 1

## 2019-07-01 MED ORDER — BUPIVACAINE HCL (PF) 0.5 % IJ SOLN
INTRAMUSCULAR | Status: DC | PRN
Start: 2019-07-01 — End: 2019-07-01
  Administered 2019-07-01: 09:00:00 1 mL

## 2019-07-01 MED ORDER — LAMISIL PO
Freq: Every day | ORAL | Status: DC
Start: ? — End: 2020-07-29

## 2019-07-01 MED ORDER — LIDOCAINE HCL (PF) 2 % IJ SOLN
INTRAMUSCULAR | Status: AC
Start: 2019-07-01 — End: 2019-07-01
  Filled 2019-07-01: qty 5

## 2019-07-01 MED ORDER — ALAWAY 0.025 % OP SOLN
OPHTHALMIC | Status: DC
Start: 2019-06-08 — End: 2022-01-19

## 2019-07-01 MED ORDER — OLOPATADINE HCL 0.1 % OP SOLN
OPHTHALMIC | Status: DC
Start: 2019-06-06 — End: 2019-08-23

## 2019-07-01 MED ORDER — BUPIVACAINE HCL (PF) 0.5 % IJ SOLN
INTRAMUSCULAR | Status: AC
Start: 2019-07-01 — End: 2019-07-01
  Filled 2019-07-01: qty 10

## 2019-07-01 MED ORDER — DEXAMETHASONE SODIUM PHOSPHATE 10 MG/ML IJ SOLN
INTRAMUSCULAR | Status: DC | PRN
Start: 2019-07-01 — End: 2019-07-01
  Administered 2019-07-01: 5 mg via INTRAVENOUS

## 2019-07-01 MED ORDER — LIDOCAINE HCL (PF) 2 % IJ SOLN
INTRAMUSCULAR | Status: DC | PRN
Start: 2019-07-01 — End: 2019-07-01
  Administered 2019-07-01: 1.5 mL

## 2019-07-01 SURGICAL SUPPLY — 15 items
APPLICATOR CHLORAPREP 3ML, CLEAR (Misc Medical Supply) ×2
CANNULA RF 16G X 100MM, CURVED SHARP (Needles/punch/cannula/biopsy) IMPLANT
CANNULA RF 20G X 100CM, CURVED SHARP, 10MM TIP (Needles/punch/cannula/biopsy) IMPLANT
CANNULA RF 20G X 145CM, CURVED SHARP, 10MM TIP (Needles/punch/cannula/biopsy) IMPLANT
CANNULA RF 22G X 100CM, CURVED SHARP, 10MM TIP (Needles/punch/cannula/biopsy)
CANNULA RF 22G X 54CM, CURVED SHARP, 5MM TIP (Needles/punch/cannula/biopsy) ×6 IMPLANT
GLOVE BIOGEL PI ULTRATOUCH SIZE 7 (Gloves/gowns) IMPLANT
GLOVE BIOGEL PI ULTRATOUCH SIZE 7.5 (Gloves/gowns) ×2 IMPLANT
GLOVE BIOGEL PI ULTRATOUCH SIZE 8 (Gloves/gowns) IMPLANT
MARKER SECURELINE SURG SKIN (Misc Medical Supply) ×2 IMPLANT
PAD GROUNDING THERMOGARD (Misc Surgical Supply) ×2 IMPLANT
PROBE RF CURVED DISPOSABLE 20G 10CM (Needles/punch/cannula/biopsy) IMPLANT
PROBE RF CURVED DISPOSABLE 22G 10CM (Needles/punch/cannula/biopsy) IMPLANT
PROBE RF DISPOSABLE 22G X 54MM (Needles/punch/cannula/biopsy) ×6 IMPLANT
TRAY SINGLE SHOT EPIDURAL (Procedure Packs/kits) ×2 IMPLANT

## 2019-07-01 NOTE — RN OR/Procedure Note (Signed)
Pt brought to procedure room via wheelchair. Chart reviewed.  Consent signed with Dr. [name] prior to procedure  Pt able to position self on procedure table without assistance or difficulty.  Pt placed on NIBP and Spo2 for monitoring  All VSS  Pt tolerated procedure well.

## 2019-07-01 NOTE — Op Note (Signed)
[facility] Note, [facility]    Preoperative Diagnosis: Cervical Spondylosis without myelopathy    Postoperative Diagnosis: Cervical Spondylosis without myelopathy    Procedure: 1.  Destruction by radiofrequency thermocoagulation of paravertebral facet joint nerves; left  C2/3 and C3/4  2. Fluoroscopy for needle guidance.     Surgeon/Staff: Ophelia Shoulder. [name], MD    Anesthesia :Lidocaine 1%                         Indications: The patient's history and physical findings include cervical pain consistent with Cervical Spondylosis without myelopathy.  The patient is here for radiofrequency thermocoagulation of paravertebral facet joint nerves to provide prolonged symptomatic pain relief.    History and physical exam are consistent with cervical spondylosis and no evidence of radicular symptoms.  The patient has had extended relief with this procedure at the same levels of greater than 50% for at least 2 months.  The procedure is designed to be therapeutic.    Procedure in detail:  Written informed consent was obtained.  The chart was reviewed, questions were answered and the patient wished to proceed. The patient had no contraindications to the procedure. Vital signs were stable. Standard monitoring was applied. The patient, the procedure nurse, and the physician confirmed the site of injection after an official "time out." The skin over the injection site was also marked and confirmed as the site of the injection.    Localization Time Out: A second timeout was performed for localization of the procedure and documented.    The patient remained awake and alert throughout the procedure.  The patient was placed in the prone position.  The skin was prepped with chlorhexidine and sterile drapes were applied. The skin and soft tissues were anesthetized with Lidocaine 1% at each site.      From a posterior approach, at the left C2/3 and C3/4 levels, a 22 gauge 54 mm 5 mm curved sharp RF Cannula  was inserted  percutaneously and advanced under fluoroscopic guidance using dorsal, lateral and oblique projections to its radiographic target. For each of the cervical levels, the tip of the needle was placed in the center of the respective lateral mass. No paraesthesias occurred and aspiration was negative.   Needle placement was confirmed radiographically., Needle placement was confirmed with neurologic stimulation., Axial sensory perception threshold using 50 Hz sensory stimulation was achieved at 0.5V or less at each level., Extremity motor stimulation at 2 Hz was negative at a minimum of 1.5 V, or 2-3 times the sensory threshold at each level., Motor stimulation was positive for multifidus stimulation..   Next, paravertebral facet joint nerve blockade was performed by injection of 1 ml of a solution of Dexamethasone (10mg /mL)  0.5 mL, Lidocaine (2%) 2 mL and Bupivicaine (0.5%)  1.5 mL at each level.     Each nerve was then treated at 80 degrees C in continuous RF mode for 60 seconds X4 after needle rotation 90 degrees at C3 and4.  After 2 attempts at the 3rd occipital, the pain was too much so treatment was done using the pulsed mode for 4 minutes at this level    The needles were removed. Sterile bandages were applied. The patient did not experience any hemodynamic or neurologic sequelae.   The patient was transferred to the recovery area.  Post operative instructions were explained and given to the patient.  The patient was discharged in stable health.

## 2019-07-01 NOTE — Discharge Instructions (Signed)
Patient verbalized understanding of below instructions    Procedure Done Today: Left cervical radiofreuqency ablation.  Please follow up with Pain Clinic 4-6 weeks via [place] video, phone or in person, call for appointment.    Post Procedure Instructions  Continue present medication unless otherwise indicated.  Resume your normal diet after being discharge.  Ice pack to treatment site (no more than 20 minutes at a time) if needed for pain relief and/or muscle spasm.  Avoid strenuous activities and driving until tomorrow morning.  If you have a band-aid dressing, you may remove it tomorrow morning.  Resume normal activities tomorrow morning, unless otherwise directed.  No submersion in water for 24 hrs.  If your next appointment is at the Pain Clinic, then please call [phone] to make an appointment.  If your next appointment is another procedure, then please call [phone] to make an appointment.    Call the [facility] at [phone] and ask for the Pain Management Fellow on-call for ANY signs of:  Temperature above 101.5 F  Redness or drainage at the treatment site  Severe, uncontrollable headache    Call 911 IN CASE OF EMERGENCY

## 2019-07-01 NOTE — H&P (Signed)
Ambulatory Surgery/Invasive Procedure History and Physical      Primary Care Physician [name]    Chief Complaint:  neck pain     68 year old male here for a left C2-4 MBB RFA  For pain control.    BP 139/81   Pulse 54   Temp 97.8 F (36.6 C)   Resp 14   SpO2 97%     Past Medical History:   Diagnosis Date   . Chronic suprapubic catheter (CMS-HCC)    . Congenital hydronephrosis    . Gout    . Headache    . Hematuria    . Kidney disease    . Kidney stones    . Major depressive disorder, single episode    . Polyarthropathy or polyarthritis of multiple sites    . Urethral stricture        Allergies   Allergen Reactions   . Amoxicillin Rash   . Sulfa Drugs Unspecified       No current facility-administered medications for this encounter.        I have reviewed the past medical history, allergies and current medications as documented in the electronic health record.      Physical Exam       Can this patient make their own healthcare decisions?  Yes    Chest:  Breaths easily     Heart:  RRR    Abdomen:  Soft    Pain Management Needs/Options discussed.    No Advanced Directives.      Resuscitative Status:  Full Code, Full Care    Diagnosis:    ICD-10-CM ICD-9-CM    1. Cervical spondylosis M47.812 721.0     Added automatically from request for surgery [id number]       Procedure: Cervical medial branch radiofrequency ablatioin    The patient has shown greater than 80% improvement of index pain with 0.28ml of local anesthetic for at least 6 hours.    Discussed Risks, Benefits, and Alternatives to procedure.  Questions answered.  Patient voiced understanding and wished to proceed. Consent Signed.    See procedure note of same date.

## 2019-07-05 ENCOUNTER — Other Ambulatory Visit: Payer: Self-pay | Admitting: Family Medicine

## 2019-07-12 ENCOUNTER — Telehealth: Payer: Managed Care, Other (non HMO) | Admitting: Physician Assistant

## 2019-07-16 ENCOUNTER — Telehealth (HOSPITAL_BASED_OUTPATIENT_CLINIC_OR_DEPARTMENT_OTHER): Payer: Self-pay | Admitting: Orthopaedic Surgery of the Spine

## 2019-07-16 NOTE — Telephone Encounter (Signed)
Returned patient call. Left message for patient to call back to schedule surgery with Dr. Lelon Huh. Please transfer call when patient calls back to schedule.

## 2019-07-17 ENCOUNTER — Encounter (HOSPITAL_BASED_OUTPATIENT_CLINIC_OR_DEPARTMENT_OTHER): Payer: Self-pay | Admitting: Orthopaedic Surgery of the Spine

## 2019-07-22 ENCOUNTER — Encounter: Payer: Self-pay | Admitting: Hospital

## 2019-07-23 ENCOUNTER — Encounter: Payer: Self-pay | Admitting: Hospital

## 2019-07-23 NOTE — Progress Notes (Signed)
Cardiology Office Note    Date:  07/26/2019   ID:  Johnny Holland, DOB Jul 20, 1951, MRN [id number]  PCP:  Ria Bush, MD  Cardiologist:  Kathlyn Sacramento, MD  Electrophysiologist:  None   Chief Complaint: Follow up  History of Present Illness:   Johnny Holland is a 68 y.o. male with history of CAD noted on piror CT imaging, aortic insufficiency, COPD secondary to tobacco abuse, HLD, Bell's palsy, diverticulosis, fatty liver disease, obesity, and ED who presents for follow up of his CAD and aortic valve disease.    Prior echo from 2018 showed an EF of 55-60%, mild LVH, no RWMA, Gr1DD, mild AS with moderate AI, an ill defined nonmobile density was noted in the aortic root area. Follow up CTA aorta in 02/2017 showed no evidence of aortic root mass with no acute findings. There was notation of aortic atherosclerosis as well an left main and 3 vessel coronary artery disease. Follow up echo in 03/2018 showed an EF of 60-65%, normal wall motion, Gr2DD, mild to moderate AI, mildly dilated left atrium, normal RVSF, PASP normal. Patient was evaluated by Dr. Fletcher Anon in 04/2018 for mild to moderate aortic valve insufficiency and multivessel CAD noted on prior CT imaging. He reported progressive exertional dyspnea over the years, which he had attributed to his lung disease and physical deconditioning. He underwent nuclear stress testing in 04/2018 that showed a small defect of mild severity in the apex location felt to likely represent artifact with an EF of 55-65%. This was a low risk study. He underwent follow up echo in 05/2019 that showed an EF of 60-65%, mildly dilated LV cavity with systolic dimension of 3.9 cm and diastolic dimension of 5.8 cm, diastolic dysfunction, normal RVSF, normal RV cavity size, mild MR, moderate calcification of the aortic valve with mild AI and mild AS.  Normal dimensions of the aortic root and ascending aorta.  Patient comes in doing well from a cardiac perspective.  He states he  continues to have stable exertional dyspnea and fatigue.  In the past, these have been attributed to his underlying aortic valve insufficiency, obesity, and physical deconditioning.  He denies any chest pain, palpitations, dizziness, presyncope, or syncope.  No lower extremity swelling, abdominal distention, orthopnea, PND, early satiety.  No falls since he was last seen.  No BRBPR or melena.  His weight does continue to increase and is up 5 pounds today compared to his last visit in the spring 2019.  Patient attributes this to portion size.  He also eats fast food 5 days/week for lunch while at work.  He does plan to retire later this year which will improve his lunch diet.  He has a goal of losing approximately 10 pounds over the next year.  Labs: 02/2019 - TC 167, TG 149, HDL 51, LDL 85, TSH normal, A1c 5.9, K+ 4.3, SCr 1.15 11/2018 - AST/ALT normal, albumin 3.9, HGB 14.6, PLT 233  Past Medical History:  Diagnosis Date   Aortic regurgitation 10/15/2012   Echocardiogram showing calcified aortic valves, mild AS, mod AR, preserved EF 55-65%, mild LV dilation and LVH (02/2017). rec rpt 1 yr.    CAD (coronary artery disease) 01/2017   4v by CT   Carotid stenosis 01/12/2016   Minimal on [name] (01/2016) f/u PRN    COPD (chronic obstructive pulmonary disease) ([place]) 01/2017   mild paraseptal and centrilobular by CT   Diverticulitis    Diverticulosis of colon    Duodenal ulcer  Hepatic steatosis 01/2017   by CT   History of smoking    HLD (hyperlipidemia)    Right shoulder pain    impingement with partial RTC tear ([place])   Right-sided Bell's palsy 12/06/2018   Thoracic aorta atherosclerosis ([place]) 01/2017   by CT    Past Surgical History:  Procedure Laterality Date   CARDIOVASCULAR STRESS TEST  04/2018   low risk study, no ischemia   COLONOSCOPY  11/30/06   diverticulosis; no polyps   INCISE AND DRAIN ABCESS     Hemmorhoid   VASECTOMY     x 2    Current  Medications: Current Meds  Medication Sig   albuterol (PROVENTIL HFA;VENTOLIN HFA) 108 (90 Base) MCG/ACT inhaler Inhale 2 puffs into the lungs every 6 (six) hours as needed for wheezing or shortness of breath.   aspirin 81 MG tablet Take 1 tablet (81 mg total) by mouth daily.   Multiple Vitamin (MULTI-VITAMIN DAILY PO) Take by mouth.   pravastatin (PRAVACHOL) 80 MG tablet TAKE 1 TABLET BY MOUTH AT BEDTIME. GENERIC EQUIVALENT FOR PRAVACHOL   scopolamine (TRANSDERM-SCOP, 1.5 MG,) 1 MG/3DAYS Place 1 patch (1.5 mg total) onto the skin every 3 (three) days. (Patient taking differently: Place 1 patch onto the skin every 3 (three) days. As needed)   sildenafil (VIAGRA) 100 MG tablet Take 0.5-1 tablets (50-100 mg total) by mouth daily as needed for erectile dysfunction.    Allergies:   Erythromycin   Social History   Socioeconomic History   Marital status: Married    Spouse name: Not on file   Number of children: 3   Years of education: Not on file   Highest education level: Not on file  Occupational History   Occupation: Runner, broadcasting/film/video: [employer] resource strain: Not on file   Food insecurity    Worry: Not on file    Inability: Not on file   Transportation needs    Medical: Not on file    Non-medical: Not on file  Tobacco Use   Smoking status: Former Smoker    Packs/day: 1.00    Years: 42.00    Pack years: 42.00    Quit date: 08/13/2011    Years since quitting: 7.9   Smokeless tobacco: Never Used   Tobacco comment: Using electronic cigarettes now  Substance and Sexual Activity   Alcohol use: Yes    Comment: Social   Drug use: No   Sexual activity: Not on file  Lifestyle   Physical activity    Days per week: Not on file    Minutes per session: Not on file   Stress: Not on file  Relationships   Social connections    Talks on phone: Not on file    Gets together: Not on file    Attends religious  service: Not on file    Active member of club or organization: Not on file    Attends meetings of clubs or organizations: Not on file    Relationship status: Not on file  Other Topics Concern   Not on file  Social History Narrative   Married; lives with wife   1 daughter, 2 sons; 2 grandchildren   Investment banker, 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  ROS:   Review of Systems  Constitutional: Positive for malaise/fatigue. Negative for chills, diaphoresis, fever and weight loss.  HENT: Negative for congestion.   Eyes: Negative for discharge and redness.  Respiratory: Positive for shortness of breath. Negative for cough, hemoptysis, sputum production and wheezing.   Cardiovascular: Negative for chest pain, palpitations, orthopnea, claudication, leg swelling and PND.  Gastrointestinal: Negative for abdominal pain, blood in stool, heartburn, melena, nausea and vomiting.  Genitourinary: Negative for hematuria.  Musculoskeletal: Negative for falls and myalgias.  Skin: Negative for rash.  Neurological: Positive for weakness. Negative for dizziness, tingling, tremors, sensory change, speech change, focal weakness and loss of consciousness.  Endo/Heme/Allergies: Does not bruise/bleed easily.  Psychiatric/Behavioral: Negative for substance abuse. The patient is not nervous/anxious.   All other systems reviewed and are  negative.    EKGs/Labs/Other Studies Reviewed:    Studies reviewed were summarized above. The additional studies were reviewed today:  2D Echo 05/2019: 1. The left ventricle has normal systolic function with an ejection fraction of 60-65%. The cavity size was mildly dilated. Left ventricular diastolic Doppler parameters are consistent with impaired relaxation.  2. The right ventricle has normal systolic function. The cavity was normal. There is no increase in right ventricular wall thickness.  3. The tricuspid valve is grossly normal.  4. The aortic valve was not well visualized. Moderate calcification of the aortic valve. Aortic valve regurgitation is mild by color flow Doppler. Mild stenosis of the aortic valve. __________  Myoview 04/2018:  There was no ST segment deviation noted during stress.  No T wave inversion was noted during stress.  Defect 1: There is a small defect of mild severity present in the apex location. likely artifact.  The study is normal.  This is a low risk study.  The left ventricular ejection fraction is normal (55-65%).  EKG:  EKG is ordered today.  The EKG ordered today demonstrates NSR, 60 bpm, left axis deviation, possible old septal infarct, no acute ST-T changes (unchanged from prior)  Recent Labs: 12/03/2018: ALT 26; Hemoglobin 14.6; Platelets 233 02/26/2019: BUN 23; Creatinine, Ser 1.15; Potassium 4.3; Sodium 140; TSH 1.25  Recent Lipid Panel    Component Value Date/Time   CHOL 167 02/26/2019 0804   TRIG 149.0 02/26/2019 0804   HDL 51.90 02/26/2019 0804   CHOLHDL 3 02/26/2019 0804   VLDL 29.8 02/26/2019 0804   LDLCALC 85 02/26/2019 0804   LDLDIRECT 130.1 10/10/2011 0938    PHYSICAL EXAM:    VS:  BP (!) 148/80    Pulse (!) 59    Ht 5\' 8"  (1.727 m)    Wt 243 lb 12.8 oz (110.6 kg)    SpO2 95% Comment: Room air   BMI 37.07 kg/m   BMI: Body mass index is 37.07 kg/m.  Physical Exam  Constitutional: He is oriented to person, place, and  time. He appears well-developed and well-nourished.  HENT:  Head: Normocephalic and atraumatic.  Eyes: Right eye exhibits no discharge. Left eye exhibits no discharge.  Neck: Normal range of motion. No JVD present.  Cardiovascular: Normal rate, regular rhythm, S1 normal and S2 normal. Exam reveals no distant heart sounds, no friction  rub, no midsystolic click and no opening snap.  Murmur heard. High-pitched blowing decrescendo early diastolic murmur is present with a grade of 1/6 at the upper right sternal border radiating to the apex. Pulses:      Posterior tibial pulses are 2+ on the right side and 2+ on the left side.  Pulmonary/Chest: Effort normal and breath sounds normal. No respiratory distress. He has no decreased breath sounds. He has no wheezes. He has no rales. He exhibits no tenderness.  Abdominal: Soft. He exhibits no distension. There is no abdominal tenderness.  Musculoskeletal:        General: No edema.  Neurological: He is alert and oriented to person, place, and time.  Skin: Skin is warm and dry. No cyanosis. Nails show no clubbing.  Psychiatric: He has a normal mood and affect. His speech is normal and behavior is normal. Judgment and thought content normal.    Wt Readings from Last 3 Encounters:  07/26/19 243 lb 12.8 oz (110.6 kg)  03/01/19 243 lb 3 oz (110.3 kg)  02/22/19 235 lb (106.6 kg)     ASSESSMENT & PLAN:   1. Exertional dyspnea with known multivessel CAD: Overall, symptoms are stable and have been present for the past several years.  Patient does have a long history of prior tobacco abuse.  Prior CTA of the chest demonstrated multivessel CAD including left main, LAD, LCx, and RCA.  Most recent echo did demonstrate stable to slightly improved aortic valve insufficiency.  His symptoms are likely multifactorial including obesity, physical deconditioning, COPD, underlying valvular heart disease, and possibly underlying ischemic heart disease.  In this setting, we  have agreed to proceed with coronary CTA to definitively evaluate for significant ischemia playing a role in his symptoms.  Continue aspirin and current dose of pravastatin for now.  2. Aortic insufficiency: Most recent echo from 05/2019 demonstrated stable to slightly improved AI with normal LV systolic function and mild LV cavity dilation not meeting requirement for surgical intervention.  Continue to monitor with annual echo.  3. Hyperlipidemia: Most recent LDL of 85 from 02/2019 with normal liver function from 11/2018.  Following patient's coronary CTA recommend transitioning from pravastatin to high intensity statin to achieve goal LDL less than 70.  We will await this transition to be undertaken after his coronary CTA as there can be some remodeling of unstable plaque with high intensity statins which could potentially falsely elevate his calcium score.  4. Obesity: Patient indicates he needs to do better with portion control and decrease his fast food consumption.  He has set a goal of a total of 10 pounds weight reduction over the next year.  Disposition: F/u with Dr. Fletcher Anon or an APP in 2 months.   Medication Adjustments/Labs and Tests Ordered: Current medicines are reviewed at length with the patient today.  Concerns regarding medicines are outlined above. Medication changes, Labs and Tests ordered today are summarized above and listed in the Patient Instructions accessible in Encounters.   Signed, Christell Faith, PA-C 07/26/2019 10:27 AM     [facility] [address] [place] [address] [place], [place] [address] [phone]

## 2019-07-26 ENCOUNTER — Ambulatory Visit (INDEPENDENT_AMBULATORY_CARE_PROVIDER_SITE_OTHER): Payer: Managed Care, Other (non HMO) | Admitting: Physician Assistant

## 2019-07-26 ENCOUNTER — Other Ambulatory Visit: Payer: Self-pay

## 2019-07-26 ENCOUNTER — Encounter: Payer: Self-pay | Admitting: Physician Assistant

## 2019-07-26 VITALS — BP 148/80 | HR 59 | Ht 68.0 in | Wt 243.8 lb

## 2019-07-26 DIAGNOSIS — Z01812 Encounter for preprocedural laboratory examination: Secondary | ICD-10-CM | POA: Diagnosis not present

## 2019-07-26 DIAGNOSIS — E785 Hyperlipidemia, unspecified: Secondary | ICD-10-CM

## 2019-07-26 DIAGNOSIS — I2510 Atherosclerotic heart disease of native coronary artery without angina pectoris: Secondary | ICD-10-CM | POA: Diagnosis not present

## 2019-07-26 DIAGNOSIS — E669 Obesity, unspecified: Secondary | ICD-10-CM

## 2019-07-26 DIAGNOSIS — R0602 Shortness of breath: Secondary | ICD-10-CM | POA: Diagnosis not present

## 2019-07-26 MED ORDER — METOPROLOL TARTRATE 100 MG PO TABS: ORAL_TABLET | ORAL | 0 refills | Status: DC

## 2019-07-26 NOTE — Patient Instructions (Signed)
Medication Instructions:  - Your physician recommends that you continue on your current medications as directed. Please refer to the Current Medication list given to you today.  If you need a refill on your cardiac medications before your next appointment, please call your pharmacy.   Lab work: - Your physician recommends that you have lab work today: BMP  If you have labs (blood work) drawn today and your tests are completely normal, you will receive your results only by: Marland Kitchen MyChart Message (if you have MyChart) OR . A paper copy in the mail If you have any lab test that is abnormal or we need to change your treatment, we will call you to review the results.  Testing/Procedures: - Your physician has recommended that you have a Cardiac CT  - Your cardiac CT will be scheduled at one of the below locations:   [facility] [address] [place], [place] [address] (336) [place] [address] [place], [place] [address] [phone]  Please arrive at _________________ on __________________  Please follow these instructions carefully (unless otherwise directed):  Hold all erectile dysfunction medications at least 48 hours prior to test.  On the Night Before the Test: . Be sure to Drink plenty of water. . Do not consume any caffeinated/decaffeinated beverages or chocolate 12 hours prior to your test. . Do not take any antihistamines 12 hours prior to your test. . If you take Metformin do not take 24 hours prior to test.  On the Day of the Test: . Drink plenty of water. Do not drink any water within one hour of the test. . Do not eat any food 4 hours prior to the test. . You may take your regular medications prior to the test.  . Take metoprolol (Lopressor) 100 mg x 1 dose  two hours prior to test (please check your heart rate prior to taking this medication, if your heart rate is < 55 bpm the morning of  your test, then do not take this) . HOLD Furosemide/Hydrochlorothiazide morning of the test. . FEMALES- please wear underwire-free bra if available   After the Test: . Drink plenty of water. . After receiving IV contrast, you may experience a mild flushed feeling. This is normal. . On occasion, you may experience a mild rash up to 24 hours after the test. This is not dangerous. If this occurs, you can take Benadryl 25 mg and increase your fluid intake. . If you experience trouble breathing, this can be serious. If it is severe call 911 IMMEDIATELY. If it is mild, please call our office. . If you take any of these medications: Glipizide/Metformin, Avandament, Glucavance, please do not take 48 hours after completing test.    Please contact the cardiac imaging nurse navigator should you have any questions/concerns Marchia Bond, RN Navigator Cardiac Imaging [facility] [phone] Office  [phone] Cell   Follow-Up: At [facility], you and your health needs are our priority.  As part of our continuing mission to provide you with exceptional heart care, we have created designated Provider Care Teams.  These Care Teams include your primary Cardiologist (physician) and Advanced Practice Providers (APPs -  Physician Assistants and Nurse Practitioners) who all work together to provide you with the care you need, when you need it. . in 2 months with Dr. Okey Regal, PA  Any Other Special Instructions Will Be Listed Below (If Applicable). -  N/A

## 2019-07-27 LAB — BASIC METABOLIC PANEL
BUN/Creatinine Ratio: 11 (ref 10–24)
BUN: 13 mg/dL (ref 8–27)
CO2: 25 mmol/L (ref 20–29)
Calcium: 9.7 mg/dL (ref 8.6–10.2)
Chloride: 102 mmol/L (ref 96–106)
Creatinine, Ser: 1.15 mg/dL (ref 0.76–1.27)
GFR calc Af Amer: 77 mL/min/{1.73_m2} (ref >59)
GFR calc non Af Amer: 66 mL/min/{1.73_m2} (ref >59)
Glucose: 93 mg/dL (ref 65–99)
Potassium: 4.7 mmol/L (ref 3.5–5.2)
Sodium: 141 mmol/L (ref 134–144)

## 2019-08-16 ENCOUNTER — Telehealth (HOSPITAL_COMMUNITY): Payer: Self-pay | Admitting: Emergency Medicine

## 2019-08-16 NOTE — Telephone Encounter (Signed)
Reaching out to patient to offer assistance regarding upcoming cardiac imaging study; pt verbalizes understanding of appt date/time, parking situation and where to check in, pre-test NPO status and medications ordered, and verified current allergies; name and call back number provided for further questions should they arise Jaidan Stachnik RN Navigator Cardiac Imaging [name] [phone] office [phone] cell 

## 2019-08-19 ENCOUNTER — Ambulatory Visit (HOSPITAL_COMMUNITY)
Admission: RE | Admit: 2019-08-19 | Discharge: 2019-08-19 | Disposition: A | Discharge status: Home / Self Care | Payer: Managed Care, Other (non HMO) | Source: Ambulatory Visit | Attending: Physician Assistant | Admitting: Physician Assistant

## 2019-08-19 ENCOUNTER — Encounter (HOSPITAL_COMMUNITY): Payer: Self-pay

## 2019-08-19 ENCOUNTER — Other Ambulatory Visit: Payer: Self-pay

## 2019-08-19 DIAGNOSIS — I2510 Atherosclerotic heart disease of native coronary artery without angina pectoris: Secondary | ICD-10-CM

## 2019-08-19 DIAGNOSIS — R0602 Shortness of breath: Secondary | ICD-10-CM | POA: Insufficient documentation

## 2019-08-19 MED ORDER — NITROGLYCERIN 0.4 MG SL SUBL
0.8000 mg | SUBLINGUAL_TABLET | Freq: Once | SUBLINGUAL | Status: AC
  Administered 2019-08-19: 08:00:00 0.8 mg via SUBLINGUAL

## 2019-08-19 MED ORDER — IOHEXOL 350 MG/ML SOLN
80.0000 mL | Freq: Once | INTRAVENOUS | Status: AC | PRN
  Administered 2019-08-19: 80 mL via INTRAVENOUS

## 2019-08-19 MED ORDER — NITROGLYCERIN 0.4 MG SL SUBL
SUBLINGUAL_TABLET | SUBLINGUAL | Status: AC
  Filled 2019-08-19: qty 2

## 2019-08-20 ENCOUNTER — Other Ambulatory Visit: Payer: Self-pay

## 2019-08-21 ENCOUNTER — Telehealth: Payer: Self-pay

## 2019-08-21 DIAGNOSIS — I2510 Atherosclerotic heart disease of native coronary artery without angina pectoris: Secondary | ICD-10-CM | POA: Diagnosis not present

## 2019-08-21 DIAGNOSIS — E7800 Pure hypercholesterolemia, unspecified: Secondary | ICD-10-CM

## 2019-08-21 MED ORDER — ATORVASTATIN CALCIUM 40 MG PO TABS: 40.0000 mg | ORAL_TABLET | Freq: Every day | ORAL | 3 refills | Status: DC

## 2019-08-21 NOTE — Telephone Encounter (Signed)
Patient made aware of Cor Ct results with verbal understanding. Patient is agree able with Christell Faith, PA recommendation. Patient will d/c pravastatin. Rx for Atorvastatin 40mg  daily sent to the patients mail order pharmacy. Orders for 2 mo fasting lipid and lft in Epic.

## 2019-08-21 NOTE — Telephone Encounter (Signed)
Called to give the patient Cor Ct results. lmctb.  Notes recorded by Rise Mu, PA-C on 08/20/2019 at 7:18 AM EDT  Cardiac findings:  Calcium score 580.  Proximal LAD lesion sent for FFR, await results.  LDL of 85 from 02/2019.  Once FFR is back, will need to change pravastatin to Lipitor 40 mg daily with planned recheck fasting lipid and liver function in ~ 8 weeks with goal LDL < 70.

## 2019-08-21 NOTE — Telephone Encounter (Signed)
-----   Message from Rise Mu, PA-C sent at 08/21/2019  3:57 PM EDT ----- FFR negative.  Aggressive risk factor modification is recommended. Please see result note on coronary CTA with recommendation to change pravastatin to Lipitor with dosage and follow up information.

## 2019-08-21 NOTE — Telephone Encounter (Signed)
Called to give the patient Cor CT results. lmtcb

## 2019-08-23 ENCOUNTER — Ambulatory Visit: Payer: Medicare Other | Admitting: Physician Assistant

## 2019-08-23 ENCOUNTER — Encounter (HOSPITAL_BASED_OUTPATIENT_CLINIC_OR_DEPARTMENT_OTHER): Payer: Self-pay | Admitting: Physician Assistant

## 2019-08-23 VITALS — BP 124/75 | HR 58 | Temp 97.9°F | Ht 68.0 in | Wt 208.0 lb

## 2019-08-23 DIAGNOSIS — Z01818 Encounter for other preprocedural examination: Secondary | ICD-10-CM

## 2019-08-23 MED ORDER — ZANAFLEX PO
ORAL | Status: DC
Start: ? — End: 2019-08-27

## 2019-08-23 MED ORDER — MUPIROCIN 2 % EX OINT
1.00 | TOPICAL_OINTMENT | Freq: Two times a day (BID) | CUTANEOUS | 0 refills | Status: DC
Start: 2019-08-23 — End: 2019-09-11

## 2019-08-23 MED ORDER — SYSTANE ULTRA OP
OPHTHALMIC | Status: DC
Start: ? — End: 2022-01-19

## 2019-08-23 NOTE — Patient Instructions (Addendum)
Marland KitchenMarland KitchenMarland KitchenMarland Kitchen....PREOPERATIVE SURGICAL INFORMATION    Your surgery is currently scheduled at [place], [facility], on 09/03/19  With a planned report time of 9:15am (this time may change, the surgery center will call or send mychart message the day before if this time needs to be changed)    [place], [facility], [place], [address] [place], [place] [phone]   Please check in at Main Admissions on the 1st floor    QUESTIONS   If you have any questions between now and the day of your surgery, please do not hesitate to call your surgeon    DAY OF SURGERY ARRIVAL TIME:  On the day of your Surgery/Procedure, please arrive at the time provided by the surgery/preop team.  If you have any questions regarding your arrival time, please call:   Preoperative Surgical Admissions at [facility]: [place]:     MEDICATIONS TO STOP 7 DAYS BEFORE SURGERY/PROCEDURE:   PLEASE HOLD ASPIRIN AND ALL NSAIDS (non-steroidal anti-inflammatory drugs) SUCH AS advil, aleve, motrin, ibuprofen, relafen, lodine, feldene, Diclofenac, voltaren, indomethacin, naproxen, celebrex, Mobic.    Please hold vitamins, supplements, herbs & fish oil.   If you do not have liver issues, Tylenol (acetaminophen) is okay    REGULAR PRESCRIPTION MEDICATIONS:   Regular prescription medications should be taken the day of surgery with sips of water    AFTER YOUR VISIT WITH [name], IF YOU START TAKING A NEW MEDICATION BEFORE SURGERY, PLEASE CALL us TO MAKE SURE IT IS SAFE TO TAKE & WON'T EFFECT YOUR SURGERY.       EATING/DRINKING   PLEASE DO NOT EAT OR DRINK ANYTHING AFTER MIDNIGHT THE NIGHT BEFORE SURGERY.      Preparing for your Surgery:   Please wear clean loose-fitting clothes and leave valuables at home   Bring a picture ID and your insurance card, and be prepared to pay your deductible or co-insurance by cash, check, or credit card when you arrive.   If you are going home after  your surgery, please make sure to arrange for an adult to drive you home.  If you do not do so, your surgery may be cancelled.      If you are a woman of child bearing age, please note that you may be asked to give a urine sample upon checkin.    On The Day of Your Surgery:    Check in at the location mentioned above   You will meet your anesthesia and surgery teams before surgery.   Once surgery is over, you will wake up in the recovery room where you will be able to see your friends/family.   Once your time in the recovery room is complete, you will either go home or be admitted as planned.   If you go home, someone will need to stay with you for the first 24 hours after surgery.     Additional information about what to expect before & after surgery is available online at:  [email or web address]    Or by searching "You-tube" for [place] before surgery and [place] after surgery    You medical records are available to you at [email or web address]  Select create account.  ------------------------------------------------------------------     Preparing for your surgery    Shower with Chlorhexidine (CHG) soap to prevent infection    Instructions:   You should shower with CHG soap a minimum of three times before your surgery,  or more often as directed by your surgeon.    Showering several times before surgery blocks germ growth and provides the best protection when used at least 3 times in a row.                      3 =                    +                  +               At least 3 showers       the morning the night the morning of   before surgery before surgery before surgery admission to surgery       Date:__9/14_____       Date:__9/14_____            Date:__9/15_____    How to shower with CHG Soap:  1. Rinse your body with warm water.  2. Wash your hair with regular shampoo. Rinse your hair with water. If you are having neck surgery, use CHG soap instead of your  regular shampoo to wash your hair. Rinse your hair with water.  3. Wet a clean sponge. Turn off the water. Apply CHG liberally.  4. Firmly massage all areas: neck, arms, chest, back, abdomen, hips, groin, genitals (external only) and buttocks. Clean your legs and feet and between your fingers and toes. Pay special attention to the site of your surgery and all surrounding skin. Ask for help to clean your back if you have a spinal surgery.   5. Lather again before rinsing.  6. Turn on the water and rinse CHG off your body.  7. Dry off with a clean towel.  8. Don't apply lotions or powders.   9. Use clean clothes and freshly laundered bed linens.          Repeat steps 1- 9 each time you shower.      Caution: When using CHG soap, avoid contact with eyes, nose, ear canals and mouth.      Important reminders:  . Do not use any other soaps or body wash when using CHG. Other soaps can block the CHG benefits.  . After showering, do not apply lotion, cream, powder, deodorant, or hair conditioner.  . Do not shave or remove body hair. Facial shaving is permitted. If you are having head surgery, ask your doctor whether you can shave.  . CHG is safe to use on minor wounds, rashes, burns, and over staples and stiches.  . Allergic reactions are rare but may occur. If you have an allergic reaction, stop using CHG and call your doctor if you have a skin irritation.  . If you are allergic to CHG, please follow the bathing instructions above using an over-the-counter regular soap instead of CHG.                          ----------------------------------------------------------------  MRSA DECOLONIZATION PROCESS  1. Nasal Ointment twice a day for 5 days leading up to date of surgery  2. Shower or Bathe with chlorhexidine soap (Hibiclens)  3. Wash bed sheets and pajamas at the start of decolonization process then wash everything, clothes/sheets/pajamas/pillow cases 1&2 days before.      How to use the nasal ointment (mupirocin 2%)    Your ointment requires a prescription and will come  in several small, single-use tubes or in one larger tube.   . If you have the small tubes, you should use half of a tube inside each nostril each time you apply the ointment. Throw away the small tube and use a new one next time.   . If you have the large tube, you should use a pea-sized amount of ointment inside each nostril each time you apply the ointment. Save the large tube and use it for all your doses.   1. Clean your hands using a sanitizer gel or wash with soap and water for 15 to 20 seconds just before using your ointment.   2. Tilt your head back and use a cotton swab to apply the ointment to the inside of each nostril.   3. Press your nostrils together and massage for about 1 minute.   4. Don't get the ointment near your eyes.   . If any of it gets into your eyes, rinse them well with cool water.   5. Apply the nasal ointment twice a day for 5 days unless otherwise directed by your doctor.   6. Clean your hands using a sanitizer gel or wash with soap and water (for 15 to 20 seconds) as soon as you are finished.   Do not use any topical medicines or inside the nose medicines (such as nasal sprays) during the 5 days you are using the ointment.       How to use the soap (4% chlorhexidine) (Hibiclens)   Your soap will come in either a bottle or in packets.   1. If you have packets, use two packets for each application in the shower or bath. If you have the bottle, use about 2 tablespoons of soap for each application in the shower or bath.   2. First, shampoo and rinse your hair with your usual shampoo. This is done first so the Hibiclens soap isn't washed off by your shampoo.   3. Using a clean washcloth, apply the Hibiclens to all areas, avoiding your face. Keep out of your eyes, ears and mouth. Make sure to wash your armpits, behind your ears and your knees, your groin area, and between any skin folds. The soap will not bubble or lather very much, and  that is fine.   . If you get the soap in your eyes, ears or mouth - rinse well with cool water.   4. When you've covered your whole body with the soap, do not rinse, but replenish the Hibiclens on your washcloth and repeat step 3.   5. When finished, leave the soap on your skin for 2 minutes.   6. Rinse the Hibiclens off your skin thoroughly.   Do not wash with any other soap or cleanser.   7. Dry off with a clean towel and put on clean clothing.   8. Using lotion for dry skin is OK, but do not use lotion if you are having a surgical procedure.

## 2019-08-26 NOTE — H&P (Cosign Needed)
PREOPERATIVE HISTORY & PHYSICAL EXAM      Referring Physician Self, Referred  Primary Care Physician [name]    History of Illness:  This is a 68 year old male here for preoperative exam prior to XLIF L3 thru L5 with laminectomy and PSIF L3 thru L5 with Dr. Lelon Huh on Q000111Q.       Allergies   Allergen Reactions   . Amoxicillin Rash   . Sulfa Drugs Unspecified       Patient Active Problem List   Diagnosis   . Post-traumatic stricture of anterior urethra   . Bladder neck contracture   . Lumbar radiculopathy   . Cervical radiculopathy   . Incomplete bladder emptying   . Lumbosacral spondylosis without myelopathy   . Chronic SI joint pain   . Degenerative spondylolisthesis   . Lumbar stenosis with neurogenic claudication   . Pre-procedure lab exam   . Cervical spondylosis without myelopathy   . Cervical spondylosis       Significant PMH/PSH:  Past Medical History:   Diagnosis Date   . Chronic suprapubic catheter (CMS-HCC)    . Congenital hydronephrosis    . Gout    . Headache    . Hematuria    . Kidney disease    . Kidney stones    . Major depressive disorder, single episode    . Polyarthropathy or polyarthritis of multiple sites    . Retinal detachment    . Urethral stricture      Past Surgical History:   Procedure Laterality Date   . CT INSERTION OF SUPRAPUBIC CATH  09/25/2015   . NEPHRECTOMY Right 1995   . APPENDECTOMY     . COLONOSCOPY     . CYSTOSCOPY     . CYSTOSCOPY W/ LASER LITHOTRIPSY     . OTHER SURGICAL HISTORY      Interstim 01/29/2011   . TRANSURETHRAL RESECTION OF PROSTATE       Social History     Socioeconomic History   . Marital status: Single     Spouse name: Not on file   . Number of children: Not on file   . Years of education: Not on file   . Highest education level: Not on file   Occupational History   . Not on file   Social Needs   . Financial resource strain: Not on file   . Food insecurity     Worry: Not on file     Inability: Not on file   . Transportation needs     Medical: Not on  file     Non-medical: Not on file   Tobacco Use   . Smoking status: Never Smoker   . Smokeless tobacco: Never Used   Substance and Sexual Activity   . Alcohol use: Not Currently   . Drug use: Not Currently   . Sexual activity: Never   Lifestyle   . Physical activity     Days per week: Not on file     Minutes per session: Not on file   . Stress: Not on file   Relationships   . Social Product manager on phone: Not on file     Gets together: Not on file     Attends religious service: Not on file     Active member of club or organization: Not on file     Attends meetings of clubs or organizations: Not on file     Relationship status:  Not on file   . Intimate partner violence     Fear of current or ex partner: Not on file     Emotionally abused: Not on file     Physically abused: Not on file     Forced sexual activity: Not on file   Other Topics Concern   . Not on file   Social History Narrative   . Not on file       Review of Systems:  General:  Negative for fevers, chills or night sweats.  Skin: Negative for rashes, sores or infection.  Eyes: Hx retinal detachment.  Negative for visual changes, diplopia or blurry vision.  Ears/Nose/Throat/Mouth: Negative for dental infection or problems.  Negative for sore throat or congestion.   Respiratory: Negative for cough, sputum production, wheezing, sleep apnea.  Cardiovascular: Hx HTN.  Negative for chest pain, palpitations, syncope, orthopnea or pnd.  Gastrointestinal: Negative for nausea, vomiting, diarrhea, melena or hematochezia.    Genitourinary: Hx kidney stones, urethral stricture, hematuria, hydronephrosis. Negative for recent dysuria, frequency, hesitancy or nocturia.  Musculoskeletal: See HPI  Neurologic: Negative for history of seizures, numbness, tingling or weakness.  Psychiatric: Depression  Hematologic/Lymphatic/Immunologic: Negative for anemia or bleeding problems.  Negative for DVT or PE history.  Endocrine: negative  Hx gout      Medications  Current  Outpatient Medications   Medication Sig Dispense Refill   . ALAWAY 0.025 % ophthalmic solution INSTILL 1 DROP INTO BOTH EYES TWICE A DAY AS DIRECTED     . allopurinol (ZYLOPRIM) 300 MG tablet Take 300 mg by mouth daily.     Marland Kitchen amLODIPINE (NORVASC) 5 MG tablet Take 10 mg by mouth daily.     Marland Kitchen atenolol (TENORMIN) 25 MG tablet Take 25 mg by mouth daily.     . Cetirizine HCl (ZYRTEC PO) 10 mg.      . ciclopirox (LOPROX) 0.77 % GEL Apply topically 2 times daily.     . mupirocin (BACTROBAN) 2 % ointment Apply 1 Application topically 2 times daily. Use a small amount as directed 2 times a day for 5 days before surgery. 1 Tube 0   . omeprazole (PRILOSEC) 20 MG capsule Take 20 mg by mouth daily.     [name] Glycol-Propyl Glycol (SYSTANE ULTRA OP)      . Terbinafine HCl (LAMISIL PO) Take by mouth daily.     Marland Kitchen tiZANidine HCl (ZANAFLEX PO)      . triamcinolone (KENALOG) 0.1 % cream Apply 1 Application topically 2 times daily. Apply a thin layer as directed     . VITAMIN D PO Take 500 mg by mouth daily.        No current facility-administered medications for this visit.        Patient is not currentlyon any anticoagulation medications    Physical Exam:     Vitals: BP 124/75 (BP Location: Left arm, BP Patient Position: Sitting, BP cuff size: Large)   Pulse 58   Temp 97.9 F (36.6 C) (Temporal)   Ht 5\' 8"  (1.727 m)   Wt 94.3 kg (208 lb)   BMI 31.63 kg/m     General: alert, no distress, cooperative  HEENT:  Pupils equal, not pinpoint.  Cardiac:  Regular rate and rhythm.  Pulmonary:clear to auscultation  Abdomen: nondistended  Neuro:normal without focal findings, mental status, speech normal, alert and oriented x iii, PERLA  Skin: Lumbar spine-skin color, texture, turgor normal. No rashes or lesions.  Musculoskeletal:  Pt ambulatory? YES  Labs/Tests/Imaging     Per anesthesia      Diagnosis  Encounter Diagnoses   Name Primary?   . Pre-op exam Yes       Assessment  Stephen [name] is a 68 year old male with history as  stated above presenting for preoperative evaluation prior to XLIF L3 thru L5 with laminectomy and PSIF L3 thru L5 with Dr. Lelon Huh on Q000111Q.        PLAN   Anesthesia pre-op: 08/27/19   Pt has covid testing scheduled.    Final clearance is made by anesthesia team on day of surgery   Per ortho department protocol, bactroban nasal prescribed twice daily for 5 days prior to surgery as MRSA prophylaxis   CHG given   Pt advised:   Nothing to eat or drink after midnight the night prior to surgery.   Take their medications as prescribed unless otherwise directed by anesthesia team   Hold any aspirin (unless advised otherwise by anesthesia), ibuprofen, aleve, naprosyn, celebrex, and other nonsteroidal anti-inflammatory drugs, fish oil, vitamins and supplements for 7 days prior to surgery.  If no liver disease, it is OK to take acetaminophen, (Tylenol) as needed.

## 2019-08-27 ENCOUNTER — Ambulatory Visit (INDEPENDENT_AMBULATORY_CARE_PROVIDER_SITE_OTHER): Payer: Medicare Other | Admitting: Nurse Practitioner

## 2019-08-27 ENCOUNTER — Encounter (INDEPENDENT_AMBULATORY_CARE_PROVIDER_SITE_OTHER): Payer: Self-pay | Admitting: Nurse Practitioner

## 2019-08-27 ENCOUNTER — Encounter (INDEPENDENT_AMBULATORY_CARE_PROVIDER_SITE_OTHER): Payer: Self-pay | Admitting: Anesthesiology

## 2019-08-27 DIAGNOSIS — Z01818 Encounter for other preprocedural examination: Secondary | ICD-10-CM

## 2019-08-27 NOTE — Patient Instructions (Signed)
PREOPERATIVE SURGICAL INFORMATION     Your surgery is currently scheduled at [facility] on 9/15   With a planned report time of [facility], [place], [address], [place], [place] [phone]   Please check in at Patient Services in [place] on 1st floor     [facility] (including [facility]): [employer] structure, Microbiologist structure, or Dance movement psychotherapist parking (7am-5pm at [employer]; 5am-5pm at [employer]) for same cost as self-parking in the front entrance of the [facility]   [email or web address]     QUESTIONS    If you have any questions between now and the day of your surgery, please do not hesitate to call:      [facility]: [phone]     DAY OF SURGERY ARRIVAL TIME:    On the day of your Surgery/Procedure, please arrive at the time provided by the surgery/preop team. If you have any questions regarding your arrival time, please call:     Preoperative Surgical Admissions at [place]:       Medications to hold prior to surgery:none       OK to take the following medications as scheduled with a small sip of water on the morning of surgery:  :  atenolol, prilosec allopurinol       PLEASE HOLD ALL NSAIDS (non-steroidal anti-inflammatory drugs) SUCH AS advil, aleve, motrin, ibuprofen, relafen, lodine, feldene, Diclofenac, voltaren, indomethacin, naproxen, celebrex, Mobic 7 days before surgery.        Please hold vitamins, supplements, herbs & fish oil 7 days before surgery.      It is OK to take acetaminophen (Tylenol) for pain around the time of surgery unless you have liver disease.       AFTER YOUR VISIT WITH [name], IF YOU START TAKING A NEW MEDICATION BEFORE SURGERY, PLEASE CALL us TO MAKE SURE IT IS SAFE TO TAKE &  WILL NOT AFFECT YOUR SURGERY.         OSA INSTRUCTIONS:      If you use a CPAP machine, please bring the entire CPAP machine, including mask and tubing, with you on the day of surgery.    TO DO LIST:    Surgical/procedure patients are required to have COVID 19 screening test 24-72 hrs prior to surgery.   Please call the scheduling line at [phone] between 8am and 5pm M-F to schedule an appointment if you do not have an appointment already.   Drive up testing locations at [place] require appointments, and are open 7 days a week except some holidays.             EATING/DRINKING          DO NOT EAT OR DRINK ANYTHING AFTER MIDNIGHT ON THE DAY OF SURGERY    Preparing for your Surgery:      Please wear clean loose-fitting clothes and leave valuables at home    Do not shave or remove body hair. Facial shaving is permitted. If you are having head surgery, ask your doctor whether you can shave.   Bring a picture ID and your insurance card, and be prepared to pay your deductible or co-insurance by cash, check, or credit card when you arrive.  If you are going home after your surgery, please make sure to arrange for an adult to drive you home. You CANNOT use UBER or LYFT. If you do not have a ride, your surgery may be cancelled.     Visitor policy during the XX123456 pandemic is subject to change. Current visitor policy can be found at [email or web address]     On The Day of Your Surgery:       Check in at the location mentioned above    If you are a woman of child bearing age, please note that you may be asked to give a urine sample upon check-in   You will meet your anesthesia and surgery teams in the preoperative holding area before surgery.    Once surgery is over, you will wake up in the recovery room.   If you go home, an adult chaperone will need to stay with you for the first 24 hours after surgery.    Visitor policy during the [id number] pandemic is subject to change.  Current visitor policy can be found at [email or web address]    A video about what to expect for the day of surgery can be found here:    [email or web address]  Or by searching "You-tube" for [place] before surgery and [place] after surgery     You medical records are available to you at [email or web address]      Preparing for your surgery    Shower with Chlorhexidine (CHG) soap to prevent infection    Instructions:   You should shower with CHG soap a minimum of three times before your surgery, or more often as directed by your surgeon.    Showering several times before surgery blocks germ growth and provides the best protection when used at least 3 times in a row.                      3 =                    +                  +               At least 3 showers       the morning the night the morning of   before surgery before surgery before surgery admission to surgery               Date:___9/14____       Date:_9/14______            Date:_9/15______    How to shower with CHG Soap:  1. Rinse your body with warm water.  2. Wash your hair with regular shampoo. Rinse your hair with water. If you are having neck surgery, use CHG soap instead of your regular shampoo to wash your hair. Rinse your hair with water.  3. Wet a clean sponge. Turn off the water. Apply CHG liberally.  4. Firmly massage all areas: neck, arms, chest, back, abdomen, hips, groin, genitals (external only) and buttocks. Clean your legs and feet and between your fingers and toes. Pay special attention to the site of your surgery and all surrounding skin. Ask for help to clean your back if you have a spinal surgery.   5. Lather again before rinsing.  6. Turn on the water and rinse CHG off your body.  7. Dry off with a clean towel.  8. Don't apply lotions or powders.  9. Use clean clothes and freshly laundered bed linens.          Repeat steps 1- 9 each time you shower.      Caution: When using CHG  soap, avoid contact with eyes, nose, ear canals and mouth.      Important reminders:  . Do not use any other soaps or body wash when using CHG. Other soaps can block the CHG benefits.  . After showering, do not apply lotion, cream, powder, deodorant, or hair conditioner.  . Do not shave or remove body hair. Facial shaving is permitted. If you are having head surgery, ask your doctor whether you can shave.  . CHG is safe to use on minor wounds, rashes, burns, and over staples and stiches.  . Allergic reactions are rare but may occur. If you have an allergic reaction, stop using CHG and call your doctor if you have a skin irritation.  . If you are allergic to CHG, please follow the bathing instructions above using an over-the-counter regular soap instead of CHG.

## 2019-08-27 NOTE — Addendum Note (Signed)
Addendum  created 08/27/19 1525 by Rosalie Doctor, NP    Flowsheet accepted

## 2019-08-27 NOTE — Anesthesia Preprocedure Evaluation (Addendum)
ANESTHESIA PRE-OPERATIVE EVALUATION    Patient Information    Name: Stephen [name]    MRN: [id number]    DOB: [birth year]    Age: 68 year old    Sex: male  Procedure(s):  FUSION, SPINE, LUMBAR, extreme lateral interbody fusion, 2 LEVELS  LAMINECTOMY, SPINE, LUMBAR, 2 LEVELS, WITH FUSION USING INSTRUMENTATION, POSTERIOR APPROACH      Pre-op Vitals:   BP 132/85 (BP Location: Right arm, BP Patient Position: Semi-Fowlers)   Pulse 55   Temp 36.4 C   Resp 18   Ht 5' 7.5" (1.715 m)   Wt 94.9 kg (209 lb 4.8 oz)   SpO2 97%   BMI 32.30 kg/m    BMI kg/m2: 32.3 kg/m2    Primary language spoken:  English    ROS/Medical History:      History of Present Illness: 68yo M (5'8" 94 kg BMI 33.5) w/ PMH of congenital hydronephrosis, complicated urologic hx and CLBP s/f XLIF L3-L5      General:  positive for Obesity,  not able to climb flight of stairs//Exercise tolaerance <4 mets,   Cardiovascular:  no CAD/Angina/MI/CABG/Stents,   hypertension,  Ekg sb w/ 1 st degree av block  Labs reviewed wnl    Anesthesia History:  negative anesthesia history ROS  no history of anesthetic complications,  no PONV,  no family history of anesthetic complications,   Pulmonary:   negative pulmonary ROS     Neuro/Psych:   negative neuro/psych ROS  psychiatric history,   Hematology/Oncology:   history of cancer (skin cancer ),      GI/Hepatic:  GERD (on protonix, well controlled), well controlled,  no liver disease,   Infectious Disease:  negative for infectious disease     Renal:  chronic renal disease,  Chronic suprabubic cath- in past now resolved   urethral stricture  Congenital hydronephrosis   Cr 1.19 Endocrine/Other:  no diabetes,  no history of thyroid disease,  arthritis,   back pain,  Chronic neck and low back pain following up for re-evaluation after undergoing a left C4-C6 diagnostic MBB     Hx venous stasis    Pregnancy History:   Pediatrics:         Pre Anesthesia Testing (PCC/CPC) notes/comments:    [facility] Test & records reviewed by [facility]  Provider.                                   Physical Exam    Airway:    Inter-inciser distance < 3 cm  Prognanth Able    Mallampati: II  Neck ROM: full  TM distance: < 4.0 cm  Short thick neck: No          Cardiovascular:  - cardiovascular exam normal         Pulmonary:  - pulmonary exam normal           Neuro/Neck/Skeletal/Skin:  - [place] ANE PHYS EXAM NEGATIVE ROS SKIN SKELETAL NEURO NECK          Dental:    Comment: Many missing teeth      Abdominal:   - normal exam         Additional Clinical Notes:               Last  OSA (STOP BANG) Score:  No data recorded    Last OSA  (STOP) Score for   No data recorded  Past Medical History:   Diagnosis Date   . Chronic suprapubic catheter (CMS-HCC)    . Congenital hydronephrosis    . Gout    . Headache    . Hematuria    . Kidney disease    . Kidney stones    . Major depressive disorder, single episode    . Polyarthropathy or polyarthritis of multiple sites    . Retinal detachment    . Urethral stricture      Past Surgical History:   Procedure Laterality Date   . CT INSERTION OF SUPRAPUBIC CATH  09/25/2015   . NEPHRECTOMY Right 1995   . APPENDECTOMY     . COLONOSCOPY     . CYSTOSCOPY     . CYSTOSCOPY W/ LASER LITHOTRIPSY     . OTHER SURGICAL HISTORY      Interstim 01/29/2011   . TRANSURETHRAL RESECTION OF PROSTATE       Social History     Tobacco Use   . Smoking status: Never Smoker   . Smokeless tobacco: Never Used   Substance Use Topics   . Alcohol use: Not Currently   . Drug use: Not Currently       Current Facility-Administered Medications   Medication Dose Route Frequency Provider Last Rate Last Dose   . lactated ringers infusion   IntraVENOUS Continuous Rosalie Doctor, NP 10 mL/hr at 09/06/19 0625 10 mL/hr at 09/06/19 1610   . lidocaine (XYLOCAINE) 1% PF injection 0.1 mL  0.1 mL IntraDERMAL Q5 Min PRN Rosalie Doctor, NP         Allergies   Allergen Reactions   . Amoxicillin Rash   . Sulfa Drugs Unspecified       Labs and Other Data  Lab Results     Component Value Date    NA 142 06/24/2018    K 3.8 06/24/2018    CL 105 06/24/2018    BICARB 24 06/24/2018    BUN 21 06/24/2018    CREAT 1.19 (H) 06/24/2018    GLU 144 (H) 06/24/2018    [place] 9.3 06/24/2018     Lab Results   Component Value Date    AST 27 03/03/2018    ALT 14 03/03/2018    ALK 74 03/03/2018    TP 7.7 03/03/2018    ALB 4.7 03/03/2018    TBILI 1.08 03/03/2018     Lab Results   Component Value Date    WBC 8.9 06/24/2018    RBC 4.77 06/24/2018    HGB 14.5 06/24/2018    HCT 43.7 06/24/2018    MCV 91.6 06/24/2018    MCHC 33.2 06/24/2018    RDW 14.4 (H) 06/24/2018    PLT 205 06/24/2018    MPV 10.0 06/24/2018    SEG 46 06/24/2018    LYMPHS 42 06/24/2018    MONOS 8 06/24/2018    EOS 3 06/24/2018    BASOS 0 06/24/2018     Lab Results   Component Value Date    INR 1.0 06/24/2018    PTT 32 06/24/2018     No results found for: ARTPH, ARTPO2, ARTPCO2    Anesthesia Plan:  Risks and Benefits of Anesthesia  I have personally performed an appropriate pre-anesthesia physical exam of the patient (including heart, lungs, and airway) prior to the anesthetic and reviewed the pertinent medical history, drug and allergy history, laboratory and imaging studies and consultations.   I have determined that the patient has had adequate assessment and testing.  I have validated the documentation of these elements of the patient exam and/or have made necessary changes to reflect my own observations during my pre-anesthesia exam.  Anesthetic techniques, invasive monitors, anesthetic drugs for induction, maintenance and post-operative analgesia, risks and alternatives have been explained to the patient and/or patient's representatives.    I have prescribed the anesthetic plan:         Planned anesthesia method: General         ASA 2 (Mild systemic disease)     Potential anesthesia problems identified and risks including but not limited to the following were discussed with patient and/or patient's representative: Adverse or allergic  drug reaction, Administration of blood products, Recall, Ocular injury, Dental injury or sore throat, Nerve injury and Injury to brain, heart and other organs    No Beta Blocker Indicated: Patient not on beta blockers    Planned monitoring method: Arterial line monitoring  Comments: (Limited TTE to assess ventricular function.  Mildly dilated RV with normal function.  LV with normal systolic function.  Impaired relaxation.  Low normal cardiac output.    Of note: Intubation was difficult.  Easy mask.  G3 with Mac 3 and cricoid.  Secured ETT with bougey.)    Informed Consent:  Anesthetic plan and risks discussed with Patient.  Use of blood products discussed with patient who.

## 2019-08-29 ENCOUNTER — Other Ambulatory Visit: Payer: Self-pay

## 2019-08-29 NOTE — Interdisciplinary (Signed)
Received records from Dr Areta Haber ( [facility]) reviewed by NP Melrose Nakayama, D on 08/29/19

## 2019-09-01 ENCOUNTER — Other Ambulatory Visit (INDEPENDENT_AMBULATORY_CARE_PROVIDER_SITE_OTHER): Payer: Medicare Other

## 2019-09-04 ENCOUNTER — Other Ambulatory Visit (INDEPENDENT_AMBULATORY_CARE_PROVIDER_SITE_OTHER): Payer: Medicare Other | Attending: Anesthesiology

## 2019-09-04 DIAGNOSIS — Z87442 Personal history of urinary calculi: Secondary | ICD-10-CM

## 2019-09-04 DIAGNOSIS — I129 Hypertensive chronic kidney disease with stage 1 through stage 4 chronic kidney disease, or unspecified chronic kidney disease: Secondary | ICD-10-CM | POA: Diagnosis present

## 2019-09-04 DIAGNOSIS — M2578 Osteophyte, vertebrae: Secondary | ICD-10-CM | POA: Diagnosis present

## 2019-09-04 DIAGNOSIS — Z1159 Encounter for screening for other viral diseases: Secondary | ICD-10-CM | POA: Insufficient documentation

## 2019-09-04 DIAGNOSIS — M48062 Spinal stenosis, lumbar region with neurogenic claudication: Principal | ICD-10-CM | POA: Diagnosis present

## 2019-09-04 DIAGNOSIS — E669 Obesity, unspecified: Secondary | ICD-10-CM | POA: Diagnosis present

## 2019-09-04 DIAGNOSIS — I440 Atrioventricular block, first degree: Secondary | ICD-10-CM | POA: Diagnosis present

## 2019-09-04 DIAGNOSIS — M533 Sacrococcygeal disorders, not elsewhere classified: Secondary | ICD-10-CM | POA: Diagnosis present

## 2019-09-04 DIAGNOSIS — K219 Gastro-esophageal reflux disease without esophagitis: Secondary | ICD-10-CM | POA: Diagnosis present

## 2019-09-04 DIAGNOSIS — Z87718 Personal history of other specified (corrected) congenital malformations of genitourinary system: Secondary | ICD-10-CM

## 2019-09-04 DIAGNOSIS — M4316 Spondylolisthesis, lumbar region: Secondary | ICD-10-CM | POA: Diagnosis present

## 2019-09-04 DIAGNOSIS — Z01818 Encounter for other preprocedural examination: Secondary | ICD-10-CM | POA: Insufficient documentation

## 2019-09-04 DIAGNOSIS — N189 Chronic kidney disease, unspecified: Secondary | ICD-10-CM | POA: Diagnosis present

## 2019-09-04 DIAGNOSIS — Z9079 Acquired absence of other genital organ(s): Secondary | ICD-10-CM

## 2019-09-04 DIAGNOSIS — M130 Polyarthritis, unspecified: Secondary | ICD-10-CM | POA: Diagnosis present

## 2019-09-04 DIAGNOSIS — G8929 Other chronic pain: Secondary | ICD-10-CM | POA: Diagnosis present

## 2019-09-04 DIAGNOSIS — M4722 Other spondylosis with radiculopathy, cervical region: Secondary | ICD-10-CM | POA: Diagnosis present

## 2019-09-04 DIAGNOSIS — Z882 Allergy status to sulfonamides status: Secondary | ICD-10-CM

## 2019-09-04 DIAGNOSIS — Z6832 Body mass index (BMI) 32.0-32.9, adult: Secondary | ICD-10-CM

## 2019-09-04 DIAGNOSIS — Z23 Encounter for immunization: Secondary | ICD-10-CM

## 2019-09-04 DIAGNOSIS — I10 Essential (primary) hypertension: Secondary | ICD-10-CM | POA: Diagnosis present

## 2019-09-04 DIAGNOSIS — M4727 Other spondylosis with radiculopathy, lumbosacral region: Secondary | ICD-10-CM | POA: Diagnosis present

## 2019-09-04 DIAGNOSIS — Z9049 Acquired absence of other specified parts of digestive tract: Secondary | ICD-10-CM

## 2019-09-04 DIAGNOSIS — Z85828 Personal history of other malignant neoplasm of skin: Secondary | ICD-10-CM

## 2019-09-04 DIAGNOSIS — Z905 Acquired absence of kidney: Secondary | ICD-10-CM

## 2019-09-04 DIAGNOSIS — M4186 Other forms of scoliosis, lumbar region: Secondary | ICD-10-CM | POA: Diagnosis present

## 2019-09-04 DIAGNOSIS — G834 Cauda equina syndrome: Secondary | ICD-10-CM | POA: Diagnosis present

## 2019-09-04 DIAGNOSIS — I878 Other specified disorders of veins: Secondary | ICD-10-CM | POA: Diagnosis present

## 2019-09-04 DIAGNOSIS — Z880 Allergy status to penicillin: Secondary | ICD-10-CM

## 2019-09-04 LAB — COVID-19 CORONAVIRUS DETECTION ASSAY AT ~~LOC~~ LAB: COVID-19 Coronavirus Result: NOT DETECTED

## 2019-09-04 NOTE — Patient Instructions (Signed)
Isolation and Result Information for [facility] Testing  (updated 07/28/2019)    PLEASE KEEP THIS DOCUMENT UNTIL YOU RECEIVE YOUR RESULTS    RESULT INFORMATION:  If your COVID-19 Coronavirus Assay swab is resulted as Detected (Positive) you will receive a call from a Provider or Team that ordered your test, you will receive follow up monitoring for 10 days.     If your COVID-19 Coronavirus Assay swab is resulted as Not Detected (Negative) this suggests that the collected specimen from your nose did not have genetic material consistent with COVID-19. There is a small chance that the test could be falsely negative. Your negative results will be released only to MyChart, if you do not have MyChart we will communicate the results to you by telephone.     More information:  Your health care provider will evaluate whether you can be cared for at home. If it is determined that you do not need hospitalization and can be isolated at home, you will be monitored by your health care provider.      You should follow the prevention steps below and even if your test is negative follow these guidelines including waiting to leave home until you are no longer contagious as per instructions at end of this document.     Contact our dedicated nurse line [phone] if you are noting worsening respiratory symptoms and need advice. This line is open 8 am -5 pm 7 days a week.  If sudden and severe worsening in symptoms please call 911 and let them know you are being tested or are COVID-19 positive.         Stay home except to get medical care  Stephen [name] has a Health order for quarantine and it is a misdemeanor if you are not following:  https://www.[place].gov/content/dam/sdc/hhsa/programs/phs/Epidemiology/covid19/HealthOfficerOrder-Isolation.pdf  · You should do no activities outside your home, except for getting medical care. Do not go to work, school, or public areas. Do not use public transportation, ride-sharing, or  taxis.  · If you have a medical appointment, call the healthcare provider and tell them that you have or may have COVID-19. This will help the healthcare provider's office take steps to keep other people from getting infected or exposed.  · Have all essential items (e.g. groceries) delivered to your home and left at your doorstep. If you need assistance with this, call 211 to learn about available services.     Separate yourself from other people and animals in your home  · People: As much as possible, stay in a specific room and away from other people in your home. If available, you should use a separate bathroom.  · Animals: Restrict contact with pets and other animals while you are sick with COVID-19, just like you would around other people. Avoid petting, snuggling, being kissed or licked, and sharing food with pets. When possible, have another member of your household care for your animals while you are sick. If you must care for your pet or be around animals while you are sick, wash your hands before and after you interact with them and wear a facemask.      Notify contacts of your illness if you test positive:  · Because people infected with COVID can spread the illness before they develop symptoms, you need to make a list of all people you've been in contact with from 48 hours prior to your first symptom through until you started home isolation.  · Contact the people on   this list and inform them of your COVID19 diagnosis or potential diagnosis if you have symptoms but are not tested.   · Inform all of your contacts that they need to quarantine themselves in their homes for 14 days from the last point of contact.  They may leave their homes only to get medical care.    · If any of your contacts are an essential worker, they should contact their employer about their return to work policy.  If their employer does not have a policy, they may follow the CDC guidelines for exposed essential workers:   https://www.cdc.gov/coronavirus/2019-ncov/community/critical-workers/implementing-safety-practices.html      Wear a facemask   · You should wear a facemask when you are around other people (e.g., sharing a room or vehicle) or pets and before you enter a health care provider's office. If you are unable to wear a facemask (for example, because it causes trouble breathing), then people who live with you should not stay in the same room with you, or they should wear a facemask if they enter your room.     Cover your coughs and sneezes  · Cover your mouth and nose with a tissue when you cough or sneeze. Throw used tissues in a lined trash can; immediately wash your hands with soap and water for at least 20 seconds or clean your hands with an alcohol-based hand sanitizer that contains 60 to 95% alcohol, covering all surfaces of your hands and rubbing them together until they feel dry. Soap and water should be used preferentially if hands are visibly dirty.     Clean your hands often  · Wash your hands often with soap and water for at least 20 seconds or clean your hands with an alcohol-based hand sanitizer that contains 60 to 95% alcohol, covering all surfaces of your hands and rubbing them together until they feel dry. Soap and water should be used preferentially if hands are visibly dirty. Avoid touching your eyes, nose, and mouth with unwashed hands.     Avoid sharing personal household items  · You should not share dishes, drinking glasses, cups, eating utensils, towels, or bedding with other people or pets in your home. After using these items, they should be washed thoroughly with soap and water.     Clean all “high-touch” surfaces everyday  · High touch surfaces include counters, tabletops, doorknobs, bathroom fixtures, toilets, phones, keyboards, tablets, and bedside tables. Also, clean any surfaces that may have blood, stool, or bodily fluids on them. Use a household cleaning spray or wipe, according to the label  instructions. Labels contain instructions for safe and effective use of the cleaning product including precautions you should take when applying the product, such as wearing gloves and making sure you have good ventilation during use of the product.     Monitor your symptoms  · Seek prompt medical attention if your illness is worsening (e.g., difficulty breathing). Before seeking care, call your health care provider and tell them that you have, or are being evaluated for, COVID-19. Put on a facemask if you have one before you enter the facility. These steps will help the health care provider's office to keep other people in the office or waiting room from being infected or exposed.   Persons who are placed under active monitoring or facilitated self-monitoring should follow instructions provided by their local health department or occupational health professionals, as appropriate.  · If you have a medical emergency and need to call 911, notify the dispatch   personnel that you have, or are being evaluated for COVID-19. If possible, put on a facemask before emergency medical services arrive.     Discontinuing home isolation**  Patients with confirmed COVID-19 or with respiratory symptoms and a negative COVID-19 test should remain under home isolation precautions until the following three things have happened:     • You have had no fever for at least 72 hours (that is three full days of no fever without the use medicine that reduces fevers)  AND  • Other symptoms have improved (for example, when your cough or shortness of breath have improved)  AND  • At least 10 days have passed since your symptoms first appeared     • You should continue to use personal protective equipment.   • If you have any symptoms or questions please contact your Health Care Provider or Primary Care Physician for further guidance.  • Patients established with [place] PCP please contact your PCP   • For those that do not have a [facility] PCP but would  like additional guidance from [place] please schedule an express care video visit by contacting [phone] [email or web address]    **For symptomatic [employer] employee please follow up with supervisor for return to work information. Please go to [email or web address] for additional information.     Please go to https://www.cdc.gov/coronavirus/2019-ncov/if-you-are-sick/index.html for additional information.    If you are an essential worker, please contact your employer after you meet the above criteria to discuss their specific return to work policy. Please abide by their policies.     Information for [employer] Students:  • If your results are negative, they will automatically be released to MyStudentChart.  • If your result is positive you will be called by a Student Health Provider and they will provide clinical guidance and initiate contact tracing.  • All [employer] Students will be provided daily medical checks until they are fully recovered  • [place] students living on campus with a positive test will be provided isolation housing on campus including daily meal support.  • Questions for [place] students: Please call [phone] to connect to an Advice Nurse both during clinic and after hours,or send a message to Ask-A-Nurse in MyStudentChart if you have questions

## 2019-09-04 NOTE — Interdisciplinary (Signed)
Verified patient with two identifiers. Obtained nasal specimen from patient and submitted to lab. Patient given self care isolation instructions to follow while waiting for test results    Aubert Choyce, MA

## 2019-09-05 ENCOUNTER — Other Ambulatory Visit (HOSPITAL_BASED_OUTPATIENT_CLINIC_OR_DEPARTMENT_OTHER): Payer: Self-pay | Admitting: Orthopaedic Surgery of the Spine

## 2019-09-05 DIAGNOSIS — R52 Pain, unspecified: Secondary | ICD-10-CM

## 2019-09-06 ENCOUNTER — Inpatient Hospital Stay (HOSPITAL_COMMUNITY): Payer: Medicare Other | Admitting: Student in an Organized Health Care Education/Training Program

## 2019-09-06 ENCOUNTER — Inpatient Hospital Stay
Admission: RE | Admit: 2019-09-06 | Discharge: 2019-09-12 | DRG: 454 | Disposition: A | Discharge status: Home Health | Payer: Medicare Other | Attending: Orthopaedic Surgery of the Spine | Admitting: Orthopaedic Surgery of the Spine

## 2019-09-06 ENCOUNTER — Inpatient Hospital Stay (HOSPITAL_COMMUNITY): Payer: Medicare Other | Admitting: Nurse Practitioner

## 2019-09-06 ENCOUNTER — Inpatient Hospital Stay (HOSPITAL_BASED_OUTPATIENT_CLINIC_OR_DEPARTMENT_OTHER): Payer: Medicare Other

## 2019-09-06 ENCOUNTER — Encounter (HOSPITAL_COMMUNITY)
Admission: RE | Disposition: A | Discharge status: Home Health | Payer: Self-pay | Attending: Orthopaedic Surgery of the Spine

## 2019-09-06 DIAGNOSIS — Z981 Arthrodesis status: Secondary | ICD-10-CM

## 2019-09-06 DIAGNOSIS — M4310 Spondylolisthesis, site unspecified: Secondary | ICD-10-CM

## 2019-09-06 DIAGNOSIS — M4726 Other spondylosis with radiculopathy, lumbar region: Secondary | ICD-10-CM

## 2019-09-06 DIAGNOSIS — M5116 Intervertebral disc disorders with radiculopathy, lumbar region: Secondary | ICD-10-CM

## 2019-09-06 DIAGNOSIS — M48062 Spinal stenosis, lumbar region with neurogenic claudication: Secondary | ICD-10-CM

## 2019-09-06 DIAGNOSIS — R2689 Other abnormalities of gait and mobility: Secondary | ICD-10-CM

## 2019-09-06 DIAGNOSIS — Z789 Other specified health status: Secondary | ICD-10-CM

## 2019-09-06 DIAGNOSIS — G549 Nerve root and plexus disorder, unspecified: Secondary | ICD-10-CM

## 2019-09-06 DIAGNOSIS — M4316 Spondylolisthesis, lumbar region: Secondary | ICD-10-CM

## 2019-09-06 DIAGNOSIS — R52 Pain, unspecified: Secondary | ICD-10-CM

## 2019-09-06 DIAGNOSIS — M48061 Spinal stenosis, lumbar region without neurogenic claudication: Secondary | ICD-10-CM

## 2019-09-06 DIAGNOSIS — G834 Cauda equina syndrome: Secondary | ICD-10-CM

## 2019-09-06 LAB — ABG+O2HBA+O2S A+O2CNA
BE, Art: 0.5 mmol/L (ref <=1.2)
BE, Art: -1.3 mmol/L (ref <=1.2)
FIO2: 58 %
FIO2: 49 %
HCO3, Art: 25 mmol/L (ref 23–29)
HCO3, Art: 24 mmol/L (ref 23–29)
Hct (Est), Art: 38 % — ABNORMAL LOW (ref 40–50)
Hct (Est), Art: 31 % — ABNORMAL LOW (ref 40–50)
Hgb, Art: 12.7 g/dL — ABNORMAL LOW (ref 14.0–17.0)
Hgb, Art: 10.2 g/dL — ABNORMAL LOW (ref 14.0–17.0)
O2 Content, Art: 17.9 vol % (ref 15.0–23.0)
O2 Content, Art: 14.4 vol % — ABNORMAL LOW (ref 15.0–23.0)
O2 Hgb, Art: 97.6 — ABNORMAL HIGH (ref 95.0–97.0)
O2 Hgb, Art: 97.3 — ABNORMAL HIGH (ref 95.0–97.0)
O2 Sat, Art: 100 % (ref 94.0–100.0)
O2 Sat, Art: 99.9 % (ref 94.0–100.0)
Temp: 34 'C
Temp: 37.7 'C
pCO2, Art (T): 36 mmHg (ref 36–46)
pCO2, Art (T): 48 mmHg — ABNORMAL HIGH (ref 36–46)
pCO2, Art (Uncorr): 41 mmHg (ref 36–46)
pCO2, Art (Uncorr): 47 mmHg (ref 36–46)
pH, Art (T): 7.44 (ref 7.35–7.46)
pH, Art (T): 7.32 — ABNORMAL LOW (ref 7.35–7.46)
pH, Art (Uncorr): 7.4 (ref 7.35–7.46)
pH, Art (Uncorr): 7.33 (ref 7.35–7.46)
pO2, Art (T): 207 mmHg — ABNORMAL HIGH (ref 74–109)
pO2, Art (T): 206 mmHg — ABNORMAL HIGH (ref 74–109)
pO2, Art (Uncorr): 221 mmHg (ref 74–109)
pO2, Art (Uncorr): 202 mmHg (ref 74–109)

## 2019-09-06 LAB — TYPE & SCREEN
ABO/RH: A POS
Antibody Screen: NEGATIVE

## 2019-09-06 LAB — CALCIUM, IONIZED, ARTERIAL
Ca Ionized, Art: 1.14 mmol/L (ref 1.13–1.32)
Ca Ionized, Art: 1.1 mmol/L — ABNORMAL LOW (ref 1.13–1.32)

## 2019-09-06 LAB — GLUCOSE, ARTERIAL WHOLE BLOOD
Glucose, Art: 130 mg/dL — ABNORMAL HIGH (ref 65–110)
Glucose, Art: 163 mg/dL — ABNORMAL HIGH (ref 65–110)

## 2019-09-06 LAB — SODIUM, ART WHOLE BLOOD
Sodium, Art: 137 mmol/L (ref 135–145)
Sodium, Art: 138 mmol/L (ref 135–145)

## 2019-09-06 LAB — ABO/RH CONFIRMATION: ABO/RH: A POS

## 2019-09-06 LAB — POTASSIUM, ART WHOLE BLOOD
Potassium, Art: 3.5 mmol/L (ref 3.5–5.0)
Potassium, Art: 4.4 mmol/L (ref 3.5–5.0)

## 2019-09-06 SURGERY — FUSION, SPINE, LUMBAR, XLIF, 2 LEVELS
Anesthesia: General | Site: Spine Lumbar | Wound class: Class I (Clean)

## 2019-09-06 MED ORDER — BUPIVACAINE HCL (PF) 0.5 % IJ SOLN
INTRAMUSCULAR | Status: AC
Start: 2019-09-06 — End: 2019-09-06
  Filled 2019-09-06: qty 30

## 2019-09-06 MED ORDER — LIDOCAINE-EPINEPHRINE 0.5 %-1:200000 IJ SOLN
INTRAMUSCULAR | Status: DC | PRN
Start: 2019-09-06 — End: 2019-09-06
  Administered 2019-09-06 (×2): 5 mL via SUBCUTANEOUS

## 2019-09-06 MED ORDER — ALBUMIN HUMAN 5 % IV SOLN
INTRAVENOUS | Status: AC
Start: 2019-09-06 — End: 2019-09-06
  Filled 2019-09-06: qty 2000

## 2019-09-06 MED ORDER — THROMBIN (RECOMBINANT) 20000 UNIT EX SOLR
CUTANEOUS | Status: DC | PRN
Start: 2019-09-06 — End: 2019-09-06
  Administered 2019-09-06: 20000 [IU] via TOPICAL

## 2019-09-06 MED ORDER — BISACODYL 10 MG RE SUPP
10.0000 mg | Freq: Every day | RECTAL | Status: DC | PRN
Start: 2019-09-06 — End: 2019-09-12

## 2019-09-06 MED ORDER — ROCURONIUM BROMIDE 100 MG/10ML IV SOLN
INTRAVENOUS | Status: DC | PRN
Start: 2019-09-06 — End: 2019-09-06
  Administered 2019-09-06 (×2): 20 mg via INTRAVENOUS
  Administered 2019-09-06: 08:00:00 60 mg via INTRAVENOUS

## 2019-09-06 MED ORDER — VITAMIN C 500 MG OR TABS
500.0000 mg | ORAL_TABLET | Freq: Two times a day (BID) | ORAL | Status: DC
Start: 2019-09-06 — End: 2019-09-12
  Administered 2019-09-07 – 2019-09-12 (×11): 500 mg via ORAL
  Filled 2019-09-06 (×12): qty 1

## 2019-09-06 MED ORDER — ONDANSETRON HCL 4 MG/2ML IV SOLN
INTRAMUSCULAR | Status: DC | PRN
Start: 2019-09-06 — End: 2019-09-06
  Administered 2019-09-06 (×2): 4 mg via INTRAVENOUS

## 2019-09-06 MED ORDER — DEXAMETHASONE SODIUM PHOSPHATE 4 MG/ML IJ SOLN (CUSTOM)
INTRAMUSCULAR | Status: DC | PRN
Start: 2019-09-06 — End: 2019-09-06
  Administered 2019-09-06: 10:00:00 10 mg via INTRAVENOUS

## 2019-09-06 MED ORDER — KETAMINE HCL 50 MG/5ML IJ SOSY
PREFILLED_SYRINGE | INTRAMUSCULAR | Status: AC
Start: 2019-09-06 — End: 2019-09-06
  Filled 2019-09-06: qty 5

## 2019-09-06 MED ORDER — FENTANYL CITRATE (PF) 250 MCG/5ML IJ SOLN
INTRAMUSCULAR | Status: AC
Start: 2019-09-06 — End: 2019-09-06
  Filled 2019-09-06: qty 5

## 2019-09-06 MED ORDER — ACETAMINOPHEN 10 MG/ML IV SOLN
INTRAVENOUS | Status: AC
Start: 2019-09-06 — End: 2019-09-06
  Filled 2019-09-06: qty 100

## 2019-09-06 MED ORDER — CEFTRIAXONE SODIUM 1 GM IJ SOLR
INTRAMUSCULAR | Status: AC
Start: 2019-09-06 — End: 2019-09-06
  Filled 2019-09-06: qty 1000

## 2019-09-06 MED ORDER — POLYETHYLENE GLYCOL 3350 OR PACK
17.0000 g | PACK | Freq: Every day | ORAL | Status: DC
Start: 2019-09-07 — End: 2019-09-12
  Administered 2019-09-09 – 2019-09-11 (×3): 17 g via ORAL
  Filled 2019-09-06 (×6): qty 1

## 2019-09-06 MED ORDER — HYDROMORPHONE HCL 1 MG/ML IJ SOLN
1.0000 mg | INTRAMUSCULAR | Status: AC | PRN
Start: 2019-09-06 — End: 2019-09-07
  Administered 2019-09-06 – 2019-09-07 (×2): 1 mg via INTRAVENOUS
  Filled 2019-09-06 (×2): qty 1

## 2019-09-06 MED ORDER — LIDOCAINE HCL (PF) 1 % IJ SOLN
0.1000 mL | INTRAMUSCULAR | Status: DC | PRN
Start: 2019-09-06 — End: 2019-09-06

## 2019-09-06 MED ORDER — MENTHOL 3 MG MT LOZG
1.0000 | LOZENGE | Freq: Three times a day (TID) | OROMUCOSAL | Status: DC | PRN
Start: 2019-09-06 — End: 2019-09-12

## 2019-09-06 MED ORDER — LACTATED RINGERS IV SOLN
INTRAVENOUS | Status: DC
Start: 2019-09-06 — End: 2019-09-06

## 2019-09-06 MED ORDER — CEFAZOLIN SODIUM 1 GM IJ SOLR
INTRAMUSCULAR | Status: DC | PRN
Start: 2019-09-06 — End: 2019-09-06
  Administered 2019-09-06: 10:00:00 1000 mg via INTRAVENOUS
  Administered 2019-09-06 (×2): 2000 mg via INTRAVENOUS

## 2019-09-06 MED ORDER — GABAPENTIN 300 MG OR CAPS
300.0000 mg | ORAL_CAPSULE | Freq: Three times a day (TID) | ORAL | Status: DC
Start: 2019-09-06 — End: 2019-09-12
  Administered 2019-09-06 – 2019-09-12 (×18): 300 mg via ORAL
  Filled 2019-09-06 (×17): qty 1

## 2019-09-06 MED ORDER — SODIUM CHLORIDE 0.9 % IV SOLN
2000.0000 mg | Freq: Three times a day (TID) | INTRAVENOUS | Status: DC
Start: 2019-09-06 — End: 2019-09-10
  Administered 2019-09-06 – 2019-09-10 (×11): 2000 mg via INTRAVENOUS
  Filled 2019-09-06 (×13): qty 2000

## 2019-09-06 MED ORDER — ACETAMINOPHEN 325 MG PO TABS
975.0000 mg | ORAL_TABLET | Freq: Once | ORAL | Status: AC
Start: 2019-09-06 — End: 2019-09-06
  Administered 2019-09-06 (×2): 975 mg via ORAL

## 2019-09-06 MED ORDER — PSYLLIUM 58.12 % PO PACK
1.0000 | PACK | Freq: Every day | ORAL | Status: DC
Start: 2019-09-07 — End: 2019-09-12
  Administered 2019-09-07 – 2019-09-12 (×6): 1 via ORAL
  Filled 2019-09-06 (×6): qty 1

## 2019-09-06 MED ORDER — HYDROMORPHONE HCL 1 MG/ML IJ SOLN
0.5000 mg | INTRAMUSCULAR | Status: DC | PRN
Start: 2019-09-06 — End: 2019-09-06

## 2019-09-06 MED ORDER — MINERAL OIL LIGHT OIL
TOPICAL_OIL | Status: AC
Start: 2019-09-06 — End: 2019-09-06
  Filled 2019-09-06: qty 10

## 2019-09-06 MED ORDER — ATENOLOL 25 MG OR TABS
25.0000 mg | ORAL_TABLET | Freq: Every day | ORAL | Status: DC
Start: 2019-09-07 — End: 2019-09-12
  Administered 2019-09-08 – 2019-09-10 (×3): 25 mg via ORAL
  Filled 2019-09-06 (×6): qty 1

## 2019-09-06 MED ORDER — EPHEDRINE SULFATE 50 MG/ML IJ SOLN
INTRAMUSCULAR | Status: DC | PRN
Start: 2019-09-06 — End: 2019-09-06
  Administered 2019-09-06 (×6): 5 mg via INTRAVENOUS

## 2019-09-06 MED ORDER — FAMOTIDINE 20 MG OR TABS
20.0000 mg | ORAL_TABLET | Freq: Two times a day (BID) | ORAL | Status: DC
Start: 2019-09-06 — End: 2019-09-12
  Administered 2019-09-06 – 2019-09-12 (×12): 20 mg via ORAL
  Filled 2019-09-06 (×12): qty 1

## 2019-09-06 MED ORDER — PROPOFOL 200 MG/20ML IV EMUL
INTRAVENOUS | Status: DC | PRN
Start: 2019-09-06 — End: 2019-09-06
  Administered 2019-09-06: 08:00:00 20 mg via INTRAVENOUS
  Administered 2019-09-06: 08:00:00 150 mg via INTRAVENOUS
  Administered 2019-09-06: 08:00:00 30 mg via INTRAVENOUS
  Administered 2019-09-06: 08:00:00 20 mg via INTRAVENOUS

## 2019-09-06 MED ORDER — ROPIVACAINE HCL 5 MG/ML IJ SOLN
INTRAMUSCULAR | Status: AC
Start: 2019-09-06 — End: 2019-09-06
  Filled 2019-09-06: qty 30

## 2019-09-06 MED ORDER — AMLODIPINE 5 MG OR TABS
5.0000 mg | ORAL_TABLET | Freq: Every day | ORAL | Status: DC
Start: 2019-09-07 — End: 2019-09-12
  Administered 2019-09-08 – 2019-09-11 (×4): 5 mg via ORAL
  Filled 2019-09-06 (×6): qty 1

## 2019-09-06 MED ORDER — ONDANSETRON HCL 4 MG/2ML IV SOLN
4.0000 mg | Freq: Once | INTRAMUSCULAR | Status: DC | PRN
Start: 2019-09-06 — End: 2019-09-06

## 2019-09-06 MED ORDER — OXYCODONE HCL 10 MG OR TABS
10.0000 mg | ORAL_TABLET | ORAL | Status: DC | PRN
Start: 2019-09-06 — End: 2019-09-11
  Administered 2019-09-06 – 2019-09-11 (×23): 10 mg via ORAL
  Filled 2019-09-06 (×23): qty 1

## 2019-09-06 MED ORDER — LIDOCAINE HCL 20 MG/ML IV INJECTION WRAPPED RECORD
INTRAVENOUS | Status: DC | PRN
Start: 2019-09-06 — End: 2019-09-06
  Administered 2019-09-06: 08:00:00 100 mg via INTRAVENOUS

## 2019-09-06 MED ORDER — PHENYLEPHRINE DILUTION 100 MCG/ML IJ SOLN
INTRAVENOUS | Status: DC | PRN
Start: 2019-09-06 — End: 2019-09-06
  Administered 2019-09-06 (×8): 100 ug via INTRAVENOUS

## 2019-09-06 MED ORDER — GLYCOPYRROLATE 1 MG/5ML IJ SOLN
INTRAMUSCULAR | Status: DC | PRN
Start: 2019-09-06 — End: 2019-09-06
  Administered 2019-09-06: 08:00:00 .3 mg via INTRAVENOUS

## 2019-09-06 MED ORDER — FENTANYL CITRATE (PF) 50 MCG/ML IJ SOLN (WRAPPED RECORD) ~~LOC~~
50.0000 ug | INTRAMUSCULAR | Status: DC | PRN
Start: 2019-09-06 — End: 2019-09-06

## 2019-09-06 MED ORDER — VANCOMYCIN HCL 1 GM IV SOLR
INTRAVENOUS | Status: AC
Start: 2019-09-06 — End: 2019-09-06
  Filled 2019-09-06: qty 2000

## 2019-09-06 MED ORDER — EPHEDRINE SULFATE (PRESSORS) 50 MG/ML IV SOLN
INTRAVENOUS | Status: DC | PRN
Start: 2019-09-06 — End: 2019-09-06
  Administered 2019-09-06 (×2): 50 mg via SUBCUTANEOUS

## 2019-09-06 MED ORDER — DEXAMETHASONE SODIUM PHOSPHATE 4 MG/ML IJ SOLN (CUSTOM)
6.0000 mg | Freq: Four times a day (QID) | INTRAMUSCULAR | Status: AC
Start: 2019-09-07 — End: 2019-09-07
  Administered 2019-09-06 – 2019-09-07 (×4): 6 mg via INTRAVENOUS
  Filled 2019-09-06 (×4): qty 2

## 2019-09-06 MED ORDER — DOCUSATE SODIUM 250 MG OR CAPS
250.0000 mg | ORAL_CAPSULE | Freq: Every evening | ORAL | Status: DC
Start: 2019-09-06 — End: 2019-09-12
  Administered 2019-09-07 – 2019-09-11 (×3): 250 mg via ORAL
  Filled 2019-09-06 (×6): qty 1

## 2019-09-06 MED ORDER — MAGNESIUM HYDROXIDE 400 MG/5ML OR SUSP
30.0000 mL | Freq: Every evening | ORAL | Status: DC | PRN
Start: 2019-09-06 — End: 2019-09-12

## 2019-09-06 MED ORDER — ACETAMINOPHEN 325 MG PO TABS
975.0000 mg | ORAL_TABLET | Freq: Three times a day (TID) | ORAL | Status: DC
Start: 2019-09-06 — End: 2019-09-12
  Administered 2019-09-06 – 2019-09-12 (×18): 975 mg via ORAL
  Filled 2019-09-06 (×17): qty 3

## 2019-09-06 MED ORDER — TIZANIDINE HCL 4 MG OR TABS
2.0000 mg | ORAL_TABLET | Freq: Three times a day (TID) | ORAL | Status: DC | PRN
Start: 2019-09-06 — End: 2019-09-10
  Administered 2019-09-06 – 2019-09-10 (×7): 2 mg via ORAL
  Filled 2019-09-06 (×7): qty 1

## 2019-09-06 MED ORDER — LACTATED RINGERS IV SOLN
INTRAVENOUS | Status: DC
Start: 2019-09-06 — End: 2019-09-06
  Administered 2019-09-06: 06:00:00 10 mL/h via INTRAVENOUS

## 2019-09-06 MED ORDER — ACETAMINOPHEN 325 MG PO TABS
650.0000 mg | ORAL_TABLET | ORAL | Status: DC | PRN
Start: 2019-09-06 — End: 2019-09-12

## 2019-09-06 MED ORDER — BUPIVACAINE HCL (PF) 0.5 % IJ SOLN
INTRAMUSCULAR | Status: DC | PRN
Start: 2019-09-06 — End: 2019-09-06
  Administered 2019-09-06 (×2): 5 mL via SUBCUTANEOUS

## 2019-09-06 MED ORDER — ACETAMINOPHEN 650 MG RE SUPP
650.0000 mg | RECTAL | Status: DC | PRN
Start: 2019-09-06 — End: 2019-09-12

## 2019-09-06 MED ORDER — TOBRAMYCIN SULFATE 80 MG/2ML IJ SOLN
INTRAMUSCULAR | Status: AC
Start: 2019-09-06 — End: 2019-09-06
  Filled 2019-09-06: qty 2

## 2019-09-06 MED ORDER — OXYCODONE HCL 5 MG OR TABS
5.0000 mg | ORAL_TABLET | ORAL | Status: DC | PRN
Start: 2019-09-06 — End: 2019-09-11
  Filled 2019-09-06: qty 1

## 2019-09-06 MED ORDER — ACETAMINOPHEN 10 MG/ML IV SOLN
INTRAVENOUS | Status: DC | PRN
Start: 2019-09-06 — End: 2019-09-06
  Administered 2019-09-06: 1000 mg via INTRAVENOUS

## 2019-09-06 MED ORDER — LIDOCAINE-EPINEPHRINE 0.5 %-1:200000 IJ SOLN
INTRAMUSCULAR | Status: AC
Start: 2019-09-06 — End: 2019-09-06
  Filled 2019-09-06: qty 50

## 2019-09-06 MED ORDER — SODIUM CHLORIDE 0.9 % IV SOLN
INTRAVENOUS | Status: DC | PRN
Start: 2019-09-06 — End: 2019-09-06
  Administered 2019-09-06: 09:00:00 15 ug/min via INTRAVENOUS

## 2019-09-06 MED ORDER — NALOXONE HCL 0.4 MG/ML IJ SOLN
0.1000 mg | INTRAMUSCULAR | Status: DC | PRN
Start: 2019-09-06 — End: 2019-09-06

## 2019-09-06 MED ORDER — FENTANYL CITRATE (PF) 50 MCG/ML IJ SOLN (WRAPPED RECORD) ~~LOC~~
25.0000 ug | INTRAMUSCULAR | Status: DC | PRN
Start: 2019-09-06 — End: 2019-09-06

## 2019-09-06 MED ORDER — SODIUM CHLORIDE 0.9 % IV SOLN
INTRAVENOUS | Status: DC
Start: 2019-09-06 — End: 2019-09-12
  Administered 2019-09-06: 21:00:00 via INTRAVENOUS

## 2019-09-06 MED ORDER — FENTANYL CITRATE (PF) 250 MCG/5ML IJ SOLN
INTRAMUSCULAR | Status: DC | PRN
Start: 2019-09-06 — End: 2019-09-06
  Administered 2019-09-06 (×3): 50 ug via INTRAVENOUS
  Administered 2019-09-06: 100 ug via INTRAVENOUS

## 2019-09-06 MED ORDER — THROMBIN (RECOMBINANT) 20000 UNIT EX SOLR
CUTANEOUS | Status: AC
Start: 2019-09-06 — End: 2019-09-06
  Filled 2019-09-06: qty 1

## 2019-09-06 MED ORDER — ROPIVACAINE HCL 5 MG/ML IJ SOLN
INTRAMUSCULAR | Status: DC | PRN
Start: 2019-09-06 — End: 2019-09-06
  Administered 2019-09-06 (×2): 30 mL via PERINEURAL

## 2019-09-06 MED ORDER — MULTI-VITAMINS PO TABS
1.0000 | ORAL_TABLET | Freq: Every day | ORAL | Status: DC
Start: 2019-09-07 — End: 2019-09-12
  Administered 2019-09-07 – 2019-09-12 (×6): 1 via ORAL
  Filled 2019-09-06 (×6): qty 1

## 2019-09-06 MED ORDER — VANCOMYCIN HCL 1 GM IV SOLR
INTRAVENOUS | Status: DC | PRN
Start: 2019-09-06 — End: 2019-09-06
  Administered 2019-09-06: 1000 mg via TOPICAL
  Administered 2019-09-06: 16:00:00 2000 mg via TOPICAL

## 2019-09-06 MED ORDER — LACTATED RINGERS IV SOLN
INTRAVENOUS | Status: DC | PRN
Start: 2019-09-06 — End: 2019-09-06
  Administered 2019-09-06 (×3): via INTRAVENOUS

## 2019-09-06 MED ORDER — SENNA 8.6 MG OR TABS
2.0000 | ORAL_TABLET | Freq: Every morning | ORAL | Status: DC
Start: 2019-09-07 — End: 2019-09-12
  Administered 2019-09-09 – 2019-09-11 (×4): 17.2 mg via ORAL
  Filled 2019-09-06 (×6): qty 2

## 2019-09-06 MED ORDER — LACTATED RINGERS IV SOLN
INTRAVENOUS | Status: DC | PRN
Start: 2019-09-06 — End: 2019-09-06
  Administered 2019-09-06 (×2): via INTRAVENOUS

## 2019-09-06 MED ORDER — PROPOFOL 1000 MG/100ML IV EMUL
INTRAVENOUS | Status: DC | PRN
Start: 2019-09-06 — End: 2019-09-06
  Administered 2019-09-06 (×2): 125 ug/kg/min via INTRAVENOUS

## 2019-09-06 MED ORDER — CETIRIZINE HCL 10 MG OR TABS
10.0000 mg | ORAL_TABLET | Freq: Every day | ORAL | Status: DC | PRN
Start: 2019-09-06 — End: 2019-09-12

## 2019-09-06 MED ORDER — SODIUM CHLORIDE 0.9 % IV SOLN
INTRAVENOUS | Status: DC | PRN
Start: 2019-09-06 — End: 2019-09-06
  Administered 2019-09-06: 08:00:00 via INTRAVENOUS

## 2019-09-06 MED ORDER — FAMOTIDINE IN NACL 20 MG/50ML IV SOLN
20.0000 mg | Freq: Two times a day (BID) | INTRAVENOUS | Status: DC
Start: 2019-09-06 — End: 2019-09-12
  Filled 2019-09-06: qty 50

## 2019-09-06 MED ORDER — HYDROMORPHONE HCL 1 MG/ML IJ SOLN
0.5000 mg | INTRAMUSCULAR | Status: AC | PRN
Start: 2019-09-06 — End: 2019-09-07

## 2019-09-06 MED ORDER — ALBUMIN HUMAN 5 % IV SOLN
INTRAVENOUS | Status: DC | PRN
Start: 2019-09-06 — End: 2019-09-06
  Administered 2019-09-06 (×2): via INTRAVENOUS

## 2019-09-06 MED ORDER — SUFENTANIL CITRATE 50 MCG/ML IV SOLN
INTRAVENOUS | Status: AC
Start: 2019-09-06 — End: 2019-09-06
  Filled 2019-09-06: qty 5

## 2019-09-06 MED ORDER — ACETAMINOPHEN 160 MG/5ML OR SOLN
650.0000 mg | ORAL | Status: DC | PRN
Start: 2019-09-06 — End: 2019-09-12

## 2019-09-06 MED ORDER — ONDANSETRON HCL 4 MG/2ML IV SOLN
4.0000 mg | Freq: Four times a day (QID) | INTRAMUSCULAR | Status: DC | PRN
Start: 2019-09-06 — End: 2019-09-12

## 2019-09-06 MED ORDER — KETAMINE HCL 50 MG/5ML IJ SOSY
PREFILLED_SYRINGE | INTRAMUSCULAR | Status: DC | PRN
Start: 2019-09-06 — End: 2019-09-06
  Administered 2019-09-06 (×4): 20 mg via INTRAVENOUS

## 2019-09-06 MED ORDER — SUFENTANIL CITRATE 50 MCG/ML IV SOLN
INTRAVENOUS | Status: DC | PRN
Start: 2019-09-06 — End: 2019-09-06
  Administered 2019-09-06 (×2): .008 ug/kg/min via INTRAVENOUS

## 2019-09-06 MED ORDER — VANCOMYCIN HCL 1 GM IV SOLR
INTRAVENOUS | Status: AC
Start: 2019-09-06 — End: 2019-09-06
  Filled 2019-09-06: qty 1000

## 2019-09-06 SURGICAL SUPPLY — 94 items
3M STERI-DRAPE 1000 (Drapes/towels) ×21 IMPLANT
BLADE SURGEON #10 STERILE (Knives/Blades) ×3 IMPLANT
BLADE SURGEON #15 STERILE (Knives/Blades) ×3 IMPLANT
BONE GRAFT SUB FORMAGRAFT XL BLOCK, LARGE, SYNTHETIC (Bone filler/cement- synthetic) ×6 IMPLANT
BRUSH SRGN SCRUB CHG 4% (Prep Solutions) ×6 IMPLANT
CAUTERY TIP EDGE BLADE ELECTRODE 2.75", INSULATED (Cautery) ×3 IMPLANT
CAUTERY TIP EDGE BLADE ELECTRODE 4.0", INSULATED (Cautery) ×3 IMPLANT
CAUTERY TIP EDGE BLADE ELECTRODE 6.5", INSULATING (Cautery) IMPLANT
CLIPPER BLADE ASSY FOR 9670 CLIPPER (Knives/Blades) ×3 IMPLANT
CONTAINER PRECISION SPECIMEN, 4OZ- STERILE (Misc Medical Supply) ×3 IMPLANT
CORD VALLEYLAB BIPOLAR FORCEP- SINGE USE (Cautery) ×6 IMPLANT
COROENT XLW 15"" 12 X 22 X 60MM (Plate) ×3 IMPLANT
COVER MAYO STAND 23 X 54" (Drapes/towels) ×3 IMPLANT
COVER SNAP KOVER BANDED BAG 30" X 36" (Drapes/towels) ×3 IMPLANT
DBM PUTTY EVO3 ACELL 10ML (Uncategorized implant- biologic) ×6 IMPLANT
DERMABOND ADVANCE 0.7ML (Suture) ×3 IMPLANT
DISSECTOR 3 ENDOPATH BLUNT TIP 5MM X 40.5CM (Lap/Endo/Arthroscopy) ×6 IMPLANT
DRAIN RELIAVAC FLAT 10MM X 20CM 0070440 (Lines/Drains) IMPLANT
DRAIN SUCTION EVAC RELIAVAC 100CC (Lines/Drains) ×3 IMPLANT
DRAPE C-ARMOR FLUOROSCAN IMAGING (Drapes/towels) ×6 IMPLANT
DRESSING MEPILEX BORDER 3 X 3 IN (Dressings/packing) ×3 IMPLANT
DRESSING MEPILEX BORDER LITE 2 X 5 IN (Dressings/packing) ×9 IMPLANT
DRESSING MEPILEX BORDER SACRUM 23 X 23CM (Dressings/packing) IMPLANT
DRESSING MEPILEX BORDER SACRUM SM 16 X 20CM (Dressings/packing) ×27 IMPLANT
DRESSING MEPILEX BORDER SILICONE 4" X 10" (Dressings/packing) ×3 IMPLANT
DRESSING TEGADERM 6" X 8" (Dressings/packing) ×6 IMPLANT
DRILL BIT STD SST TWIST 2 MM X 127MM (Drills/Bits/Burs/Taps/Reamers) ×18 IMPLANT
DRILL PRECISION NEURO 3.0 X 3.8MM (Drills/Bits/Burs/Taps/Reamers) ×3 IMPLANT
FORCEP BIPOLAR 8 1/2 1.0MM TIP SINGLE USE (Cautery) ×3 IMPLANT
FORCEP BIPOLAR BAYONET 10.5" 1.0MM TIP (Misc Medical Supply) ×3 IMPLANT
GLOVE BIOGEL INDICATOR UNDERGLOVE SIZE 7 (Gloves/Gowns) ×15 IMPLANT
GLOVE BIOGEL INDICATOR UNDERGLOVE SIZE 7.5 (Gloves/Gowns) ×6 IMPLANT
GLOVE BIOGEL INDICATOR UNDERGLOVE SIZE 8 (Gloves/Gowns) ×6 IMPLANT
GLOVE BIOGEL INDICATOR UNDERGLOVE SIZE 8.5 (Gloves/Gowns) ×6 IMPLANT
GLOVE BIOGEL PI INDICATOR SIZE 7 (Gloves/Gowns) ×9 IMPLANT
GLOVE BIOGEL PI INDICATOR SIZE 8.5 (Gloves/Gowns) ×6 IMPLANT
GLOVE BIOGEL PI ULTRATOUCH SIZE 6.5 (Gloves/Gowns) ×6 IMPLANT
GLOVE BIOGEL PI ULTRATOUCH SIZE 7 (Gloves/Gowns) ×3 IMPLANT
GLOVE BIOGEL PI ULTRATOUCH SIZE 8 (Gloves/Gowns) ×6 IMPLANT
GLOVE SURGEON BIOGEL SIZE 7 (Gloves/Gowns) ×9 IMPLANT
GLOVE SURGICAL BIOGEL SIZE 6.5 (Gloves/Gowns) ×6 IMPLANT
GLOVE SURGICAL BIOGEL SIZE 8 (Gloves/Gowns) ×6 IMPLANT
GLOVE SURGICAL BIOGEL SIZE 8.5 (Gloves/Gowns) ×6 IMPLANT
GOWN SURGICAL ULTRA LG BLUE, AAMI LVL 3 (Gloves/Gowns) ×6 IMPLANT
GOWN SURGICAL ULTRA XL BLUE, AAMI LVL 3 (Gloves/Gowns) ×6 IMPLANT
GOWN SURGICAL XXLG BLUE (Gloves/Gowns) ×6 IMPLANT
HEADREST PRONE POSITION (Patient Care Supply) IMPLANT
HEMOSTATIC MATRIX SURGIFLO W/O THROMBIN 2991 (Hemostatic agents/wax/sealants-absorbable) ×9 IMPLANT
HOVERMATT VELCRO SINGLE PATIENT USE 34" (NEW/REPROCESSED) (Patient Care Supply) ×3 IMPLANT
IMPLANT COROENT XLW 15 DEG 10 X 22 X 60MM ×3 IMPLANT
KIT DILATOR NEUROVISION NVM5 XLIF, DISP (Kits/Sets/Trays) ×3 IMPLANT
KIT INFUSE BONE GRAFT MEDIUM 5.6CC (Grafts) ×3 IMPLANT
KIT JACKSON PRONE VIEW PATIENT CARE KIT (Patient Care Supply) IMPLANT
KIT PT CARE JACKSON TABLE (Kits/Sets/Trays) IMPLANT
MILL BONE STRYKER MEDIUM, DISP (Misc Surgical Supply) ×3 IMPLANT
MODULE MAXCESS 4 (Misc Medical Supply) ×3 IMPLANT
MODULE NEUROVISION NEEDLE (Misc Medical Supply) ×3 IMPLANT
PACK AUTOGRAFT (Drape/Gowns/Gloves/Pack) IMPLANT
PACK SPINAL POSITIONING (Procedure Packs/kits) ×3 IMPLANT
PACK SPINAL SURGERY (Procedure Packs/kits) ×3 IMPLANT
PAD ARMBOARD CONV FOAM 2X8X20 (Misc Surgical Supply) ×12 IMPLANT
PAD GROUND VALLEYLAB REM ADULT E7507 (Misc Surgical Supply) ×9 IMPLANT
PROBE NERVE STIMULATOR PRASS W/PROTECTED PIN OD 0.5MM MONOPOLAR (Misc Surgical Supply) ×3 IMPLANT
PROTECTOR ULNAR NERVE PAD, YELLOW (Misc Surgical Supply) IMPLANT
PUTTY MAGNETOS 10CC 1-2MM (Bone/chips/putty) ×3 IMPLANT
PUTTY MAGNETOS 5CC 1-2MM (Bone/chips/putty) ×3 IMPLANT
ROD SPINE EXPEDIUM 5.5 X 75MM PRE-BENT (Spine cages/spacers/discs/fixation) ×6 IMPLANT
SCREW SET EXPEDIUM 5.5 SINGLE INNER (Screws/anchors/cables) ×18 IMPLANT
SCREW VIPER FENESTRATED CORTICAL 6 X 55MM (Screws/anchors/cables) ×18 IMPLANT
SEALER AQUAMANTYS 2.3 BIPOLAR (Lap/Endo/Arthroscopy) ×3 IMPLANT
SLEEVE SCD KNEE MEDIUM (Misc Medical Supply) ×3 IMPLANT
SOLUTION IRR POUR BTL 0.9% NS 1000ML (Non-Pharmacy Meds/Solutions) ×6 IMPLANT
SOLUTION IRR POUR BTL H20 1000ML (Non-Pharmacy Meds/Solutions) ×6 IMPLANT
SPONGE LAP RF DETECT 18" X 18" XRAY STERILE (Sponges) ×3 IMPLANT
SPONGE LAP RF DETECT 4" X 18" XRAY STERILE (5 PIECES) (Sponges) ×12 IMPLANT
SPONGE SURGIFOAM LARGE (GELFOAM) (Hemostatic agents/wax/sealants-absorbable) ×3 IMPLANT
STRAP POSITIONING KNEE/BODY, HOOK AND LOOP (Misc Surgical Supply) IMPLANT
SURGICAL PACK SPINAL STAGING (Procedure Packs/kits) ×3 IMPLANT
SURGILUBE JELLY 2OZ- STERILE (Misc Surgical Supply) ×3 IMPLANT
SUTURE BOOT YELLOW (Misc Medical Supply) IMPLANT
SUTURE ETHILON 2-0 30" FSLX (Suture) IMPLANT
SUTURE MONOCRYL PLUS 3-0 27" PS-2 (MCP427) (Suture) ×9 IMPLANT
SUTURE PROLENE 6-0 24" BV-1 (Suture)
SUTURE PROLENE 6-0 24" BV-1 8805H (Suture) IMPLANT
SUTURE STRATAFIX PDS PLUS 1 CTX 45CM (Suture) ×3 IMPLANT
SUTURE VICRYL PLUS 0 18" CT-1 CR VCP840D (Suture) IMPLANT
SUTURE VICRYL PLUS 0 18" MO-4 CR VCP701D (Suture) ×9 IMPLANT
SUTURE VICRYL PLUS 1 18" MO-4 CR VCP702D (Suture) ×6 IMPLANT
SUTURE VICRYL PLUS 1-0 18" CT-1 POPS (Suture) IMPLANT
SUTURE VICRYL PLUS 2-0 18" CT-1 (Suture) ×3
SUTURE VICRYL PLUS 2-0 18" CT-1 VCP839D (Suture) ×6 IMPLANT
TOWELS OR BLUE 4-PACK STERILE, DISPOSABLE (Drapes/towels) ×15 IMPLANT
TRAY FOLEY SURESTEP LUBRI-SIL I.C.16FR URIMETER, LF, TEMP SENSING (Lines/Drains) ×6 IMPLANT
WAX BONE 2.5 GRAM HEMOSTATIC (Hemostatic agents/wax/sealants-absorbable) ×12 IMPLANT

## 2019-09-06 NOTE — Anesthesia Procedure Notes (Signed)
Arterial Line Procedure Note  Procedure: Arterial Line Insertion Date & Time: Date & Time: 09/06/2019 8:00 AM   Preparation: Patient was prepped and draped in usual sterile fashion  Indications:multiple ABGs and hemodynamic monitoring  Location: left radial Universal Protocol: Universal Protocol: Verbal consent obtained, risks and benefits discussed, patient states understanding of the procedure being performed, the patient's understanding of the procedure matches consent given and required blood products, implants, devices, and special equipment available  Consent given by: Consent given by: patient  Patient identity confirmed: Patient identity confirmed by: arm band   Anesthesia:    Anesthetic total:  mL Sedation: patient sedated   Procedure Details: 20, Seldinger technique used and 2  Needle Gauge: 20  Number of insertion attempts 2  Ultrasound Guided Ultrasound Guided Procedure    Medications Administered at:  09/06/2019 8:00 AM  Post Procedure: dressing applied  dressing applied   Comments: I, Monte Fantasia, MD, was present for the entire procedure.    Monte Fantasia, MD  09/06/2019 11:35 AM

## 2019-09-06 NOTE — Plan of Care (Signed)
Problem: Promotion of Perioperative Health and Safety  Goal: Promotion of Health and Safety of the Perioperative Patient  Description: The patient remains safe, receives treatment appropriate to the surgical intervention and patient's physiological needs and is discharged or transferred to the appropriate level of care.    Information below is the current care plan.  Outcome: Progressing  Flowsheets (Taken 09/06/2019 1628)  Guidelines: PACU  Individualized Interventions/Recommendations #1: reposition for pain

## 2019-09-06 NOTE — Consults (Signed)
UROLOGY CONSULT NOTE    CONSULT RE:  Difficult foley placement    History of Present Illness:     Stephen [name] is a 68 year old here for back surgery, now with difficult foley placement. 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 Lucia [name].      Past Medical and Surgical History:  Past Medical History:   Diagnosis Date   . Chronic suprapubic catheter (CMS-HCC)    . Congenital hydronephrosis    . Gout    . Headache    . Hematuria    . Kidney disease    . Kidney stones    . Major depressive disorder, single episode    . Polyarthropathy or polyarthritis of multiple sites    . Retinal detachment    . Urethral stricture      Past Surgical History:   Procedure Laterality Date   . CT INSERTION OF SUPRAPUBIC CATH  09/25/2015   . NEPHRECTOMY Right 1995   . APPENDECTOMY     . COLONOSCOPY     . CYSTOSCOPY     . CYSTOSCOPY W/ LASER LITHOTRIPSY     . OTHER SURGICAL HISTORY      Interstim 01/29/2011   . TRANSURETHRAL RESECTION OF PROSTATE         Allergies:  Allergies   Allergen Reactions   . Amoxicillin Rash   . Sulfa Drugs Unspecified       Medications:  No current facility-administered medications on file prior to encounter.      Current Outpatient Medications on File Prior to Encounter   Medication Sig Dispense Refill   . allopurinol (ZYLOPRIM) 300 MG tablet Take 300 mg by mouth daily.     Marland Kitchen amLODIPINE (NORVASC) 5 MG tablet Take 10 mg by mouth daily.     Marland Kitchen atenolol (TENORMIN) 25 MG tablet Take 25 mg by mouth daily.     . Cetirizine HCl (ZYRTEC PO) 10 mg.      . ciclopirox (LOPROX) 0.77 % GEL Apply topically 2 times daily.     Marland Kitchen omeprazole (PRILOSEC) 20 MG capsule Take 20 mg by mouth daily.     Marland Kitchen triamcinolone (KENALOG) 0.1 % cream Apply 1 Application topically 2 times daily. Apply a thin layer as directed         Social History:  Social History     Socioeconomic  History   . Marital status: Single     Spouse name: Not on file   . Number of children: Not on file   . Years of education: Not on file   . Highest education level: Not on file   Occupational History   . Not on file   Tobacco Use   . Smoking status: Never Smoker   . Smokeless tobacco: Never Used   Substance and Sexual Activity   . Alcohol use: Not Currently   . Drug use: Not Currently   . Sexual activity: Never   Social Activities of Daily Living Present   . Not on file   Social History Narrative   . Not on file       Family History:  Family History   Adopted: Yes   Family history unknown: Yes       ----------------------------------------------------------------------------------------------    Physical Exam:  BP  Min: 132/85  Max: 132/85  Temp  Min:  97.5 F (36.4 C)  Max: 97.5 F (36.4 C)  Pulse  Min: 55  Max: 55  Resp  Min: 18  Max: 18  SpO2  Min: 97 %  Max: 97 %  Height  Min: 5' 7.5" (171.5 cm)  Max: 5' 7.5" (171.5 cm)  Weight  Min: 94.9 kg (209 lb 4.8 oz)  Max: 94.9 kg (209 lb 4.8 oz)    No intake/output data recorded.    HEAD: normalcephalic/atraumatic  NECK: midline trachea. No jugular venous distention  EYES: no erythema   CV: distal pulses intact  PULM/RESP: normal effort of breathing  GI: soft, ND, no masses.  EXTREMITES/HEME: Joints are normal without redness or swelling.   SKIN: There is no edema or cyanosis.     Laboratory :  No results for input(s): WBC, HGB, HCT, PLT, BAND, SEG, LYMPHS, MONOS in the last 72 hours.     No results for input(s): NA, K, CL, BICARB, BUN, CREAT, GLU, [place], MG, PHOS, IONCA in the last 72 hours.    Invalid input(s): CAI2  No results for input(s): ALK, AST, ALT, TBILI, DBILI, ALB in the last 72 hours.       No results for input(s): PT, PTT, INR in the last 72 hours.    Lab Results   Component Value Date    COLORUA Red (A) 06/24/2018    APPEARUA Bloody (A) 06/24/2018    GLUCOSEUA 1+ (A) 06/24/2018    BILIUA Negative 06/24/2018    KETONEUA 1+ (A) 06/24/2018    SGUA 1.017  06/24/2018    BLOODUA 2+ (A) 06/24/2018    PHUA 6.0 06/24/2018    PROTEINUA 2+ (A) 06/24/2018    UROBILUA Negative 06/24/2018    NITRITEUA Negative 06/24/2018    LEUKESTUA Negative 06/24/2018    WBCUA 0-2 06/24/2018    RBCUA >50 (A) 06/24/2018       No results found for: PSA    Lab Results   Component Value Date    CREAT 1.19 (H) 06/24/2018    CREAT 1.63 (H) 03/03/2018    CREAT 1.16 02/28/2017       Microbiology:   Microbiology Results (last 30 days)     Procedure Component Value - Date/Time    COVID-19 Coronavirus Detection Assay at [facility] [[id number]] Collected: 09/04/19 0800    Lab Status: Final result Updated: 09/04/19 2142     COVID-19 Source Nasal     COVID-19 Coronavirus Detection Not Detected     Comment: COVID-19 coronavirus RNA not detected by nucleic acid amplification.  A negative test does NOT completely  rule out COVID-19 infection or preclude   the possibility of recent exposure to COVID-19.  --------------------------------------------------   [facility]'s method of detection is nucleic acid detection.  This test was developed and its performance  characteristics determined by the [name]. It has not been cleared or   approved by the U.S. Food and Drug Administration (FDA).  The FDA has determined that such clearance or approval  is not necessary. This test is used for clinical purposes.  It should not be regarded as investigational or for   research. This laboratory is certified under the Clinical  Laboratory Improvement Amendments (CLIA) as qualified   to perform high complexity clinical laboratory testing.  .  Expected result: Not Detected               Radiology:  No results found.    ______________________________________________________________________  Assessment and Plan:  Stephen [name]  Stephen [name] is a 68 year old here for back surgery, now with difficult foley placement. 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 Lucia [name].    The patient was asleep on the OR table.     Nursing attempted a 37F foley and was able to get urine return but was not able to fully advance it into the bladder. I utilized a 95K silicone foley and was able to place it into the bladder without any issues. Clear yellow urine.    - foley per primary team. Would keep foley in place until mobilizing at bedside and not dependent on nacrotics.    If the foley needs to be replaced, it may be replaced by nursing. They should use a 93O or 67T silicone foley catheter with plenty of lube.      Discussed with Dr. Garlon Hatchet.      ---  Darrold Span, MD  [facility], PGY-4    Urology pagers  Please do not hesitate to contact the appropriate service with any questions or concerns.  Prudence Davidson ([facility]): [phone]  [facility]: [phone]

## 2019-09-06 NOTE — H&P (Signed)
HISTORY & PHYSICAL - INTERVAL ASSESSMENT    **ONLY TO BE USED IN ADDITION TO A HISTORY & PHYSICAL**    Stephen [name]  [id number]    Orthopaedic Spine Surgery Interval H&P Update    Stephen [name] is a 68 year old male presenting for elective spine surgery today as scheduled, 09/06/2019. Patient denies any interval changes from their most recent documented H&P, completed 08-23-19    Current Medical Status:  Unchanged    Medications / Allergies:  Unchanged    Review of Systems:  Unchanged    Physical Examination:  I have examined the patient today.  BP 132/85 (BP Location: Right arm, BP Patient Position: Semi-Fowlers)   Pulse 55   Temp 97.5 F (36.4 C)   Resp 18   Ht 5' 7.5" (1.715 m)   Wt 94.9 kg (209 lb 4.8 oz)   SpO2 97%   BMI 32.30 kg/m       General: patient awake, alert, and responding to commands; no apparent distress  Cardio: regular rate and rhythm per pulse  Respiratory: patient breathing quietly without use of accessory muscles    Bilateral Lower Extremity   General:        Atraumatic, normal muscle tone, no atrophy.   Motor:       Right           Left        Iliopsoas (L2/3)                     5/5           5/5         Quadriceps (L4)                    5/5           5/5        Tibialis Anterior (L4/5)           5/5           5/5        EHL (L5)                               5/5           5/5        GSC (S1)                              5/5           5/5   Sensation:        SILT in L2-S1 distributions.   Vascular:        Palpable DP pulse. Capillary refill <2s.     Laboratory or Clinical Data:  Unchanged    Modifications of Initial Care Plan:  Unchanged    Orthopaedic Spine Surgery Service - Consent Documentation  Discussed recommended operative procedure with patient in detail. Indications and procedural description provided in depth. All questions were answered. Specific risks discussed included but were not limited to blood loss, infection, nerve damage, partial or complete paralysis, bowel  or bladder dysfunction, development of adjacent segment disease, incomplete resolution of symptoms, symptomatic hardware, hardware failure, the possible need for additional operations, and the risks associated with anaesthesia including deep venous thrombosis, pulmonary embolism, stroke, heart attack, and potentially death. Patient verbalized an understanding of these risks and signed  consent can be found in the chart and EMR.    Frederic Jericho, MD  Orthopaedic Spine Fellow

## 2019-09-06 NOTE — Interdisciplinary (Signed)
2000 - Hand off report received from [place].

## 2019-09-07 LAB — BASIC METABOLIC PANEL, BLOOD
Anion Gap: 14 mmol/L (ref 7–15)
BUN: 17 mg/dL (ref 8–23)
Bicarbonate: 22 mmol/L (ref 22–29)
Calcium: 8.4 mg/dL — ABNORMAL LOW (ref 8.5–10.6)
Chloride: 105 mmol/L (ref 98–107)
Creatinine: 1.2 mg/dL — ABNORMAL HIGH (ref 0.67–1.17)
GFR: >60 mL/min
Glucose: 174 mg/dL — ABNORMAL HIGH (ref 70–99)
Potassium: 4.7 mmol/L (ref 3.5–5.1)
Sodium: 141 mmol/L (ref 136–145)

## 2019-09-07 LAB — HEMOGRAM, BLOOD
Hct: 26.5 % — ABNORMAL LOW (ref 40.0–50.0)
Hgb: 8.7 gm/dL — ABNORMAL LOW (ref 13.7–17.5)
MCH: 29.6 pg (ref 26.0–32.0)
MCHC: 32.8 g/dL (ref 32.0–36.0)
MCV: 90.1 um3 (ref 79.0–95.0)
MPV: 10.7 fL (ref 9.4–12.4)
Plt Count: 138 10*3/uL — ABNORMAL LOW (ref 140–370)
RBC: 2.94 10*6/uL — ABNORMAL LOW (ref 4.60–6.10)
RDW: 14.9 % — ABNORMAL HIGH (ref 12.0–14.0)
WBC: 12.7 10*3/uL — ABNORMAL HIGH (ref 4.0–10.0)

## 2019-09-07 NOTE — Interdisciplinary (Signed)
09/07/19 1619   Initial Assessment   CM Initial Assessment * Completed   Patient Information   Where was the patient admitted from? * Home   Prior to Level of Function * Ambulatory/Independent with ADL's   Assistive Device * Not applicable   Primary Caretaker(s) * Self;Family   Primary Contact Name, Number and Relationship [name] - sister, [phone]   Permission to Contact * Yes   Discharge Planning   Living Arrangements * Family Member   Type of [place] * [name]   Anticipated Discharge Dispostion/Needs Home with Family   Barriers to Discharge * Awaiting clinical improvement   Patient/Family/Other Engaged in Discharge Planning * Yes   Name, Relationship and Phone Number of Person Engaged in the Discharge Plan [name] - sister, [phone]   Patient Has Decision Making Capacity * Yes   Patient/Family/Legal/Surrogate Decision Maker Has Been Given a List Options And Choice In The Selection of Post-Acute Care Providers * Not Applicable   Family/Caregiver's Assessed for * Readiness, willingness, and ability to provide or support self-management activities;Readiness to provide care to the patient   Respite Care * Not Applicable   Patient/Family/Other Are In Agreement With Discharge Stephen [name]   Social Worker Consult   Do you need to see a Education officer, museum? * [name]   Readmission Risk Assessment   Readmission Within 30 Days of Discharge * [name]   Recent Hospitalizations (Within Last 6 Months) * [name]   High Risk For Readmission * [name]   Medical Necessity:    LOS at time of Initial Assessment: 1 Day 11 Hours  Pt admitted on 09/06/2019  5:06 AM    LACE+ Score: 26    Address verified as discharge address:   [place] [address] - [phone] (home)    PCP verified:  [name]  [address] [place] [address]  telephone [phone]  fax [phone]    Pharmacy:  [id number] - [place], [address]    PLOF:  independent    Hx of SNF placement:  none    Hx of Home health services:  none   - Use previous home health agency upon discharge (yes/[name]):     DME (includes ALL home equipment): none    Hx of HD/PD: none  Maintenance Dialysis History    Patient has [name] recorded history of maintenance dialysis.     Clinic:  Address:   Phone:  Chair schedule:  Transport:  Verified by:     DISCHARGE PLANNING    Support system:  [name] - sister, [phone]    Anticipated DC disposition (home, SNF, etc) :  home , pt lives with sister    Anticipated DC needs (HH, DME, none, etc): TBD      Anticipated barriers to discharge:  none     Transportation: sister will drive home            Expected discharge date:  TBD       Gwynne Edinger, RN

## 2019-09-07 NOTE — Progress Notes (Signed)
Orthopaedic Spine Surgery Progress Note for Stephen [name]  Current Length of Stay:   1 day - Admitted on: 09/06/2019    Subjective:  Had increased pain overnight, also 626ml drainage per RN from drain. Overall feeling OK, sitting up and eating     Objective:  Vital Signs:  BP 109/65 (BP Location: Right arm, BP Patient Position: Semi-Fowlers)   Pulse 73   Temp 98.1 F (36.7 C)   Resp 18   Ht 5\' 7"  (1.702 m)   Wt 94.3 kg (208 lb)   SpO2 95%   BMI 32.58 kg/m     Physical Exam:  General: patient awake, alert, and responding to commands; no apparent distress  Cardio: regular rate and rhythm per pulse  Respiratory: patient breathing quietly without use of accessory muscles  Neck/Back: dressings in place with some saturation, serosanguinous output in drain       Bilateral Lower Extremity   Motor:       Right           Left        Iliopsoas (L2/3)                     5/5           5/5         Quadriceps (L4)                    5/5           5/5        Tibialis Anterior (L4/5)           5/5           5/5        EHL (L5)                               5/5           5/5        GSC (S1)                              5/5           5/5        FHL (S2)                               5/5           5/5   Sensation:        SILT in L2-S15 distributions.   .     Laboratory Data:   Lab Results   Component Value Date    WBC 12.7 (H) 09/07/2019    HGB 8.7 (L) 09/07/2019    HCT 26.5 (L) 09/07/2019    PLT 138 (L) 09/07/2019       Input/Output:      Intake/Output Summary (Last 24 hours) at 09/07/2019 0828  Last data filed at 09/07/2019 0439  Gross per 24 hour   Intake 5781.25 ml   Output 4715 ml   Net 1066.25 ml           Current Medications:  Current Facility-Administered Medications   Medication   . acetaminophen (TYLENOL) tablet 650 mg    Or   . acetaminophen (TYLENOL) solution 650 mg    Or   . acetaminophen (  TYLENOL) suppository 650 mg   . acetaminophen (TYLENOL) tablet 975 mg   . amLODIPINE (NORVASC) tablet 5 mg   . ascorbic  acid (VITAMIN C) tablet 500 mg   . atenolol (TENORMIN) tablet 25 mg   . bisacodyl (DULCOLAX) suppository 10 mg   . ceFAZolin (ANCEF) 2,000 mg in sodium chloride 0.9 % 100 mL IVPB   . cetirizine (ZYRTEC) tablet 10 mg   . dexAMETHasone (DECADRON) injection 6 mg   . docusate sodium (COLACE) capsule 250 mg   . famotidine (PEPCID) IVPB 20 mg    Or   . famotidine (PEPCID) tablet 20 mg   . gabapentin (NEURONTIN) capsule 300 mg   . HYDROmorphone (DILAUDID) injection 0.5 mg   . HYDROmorphone (DILAUDID) injection 1 mg   . magnesium hydroxide (MILK OF MAGNESIA) suspension 30 mL   . menthol (CEPACOL) lozenge 3 mg   . multivitamin tablet 1 tablet   . ondansetron (ZOFRAN) injection 4 mg   . oxyCODONE (ROXICODONE) tablet 10 mg   . oxyCODONE (ROXICODONE) tablet 5 mg   . polyethylene glycol (MIRALAX) packet 17 g   . psyllium (METAMUCIL) 58.12 % packet 1 packet   . senna (SENOKOT) tablet 17.2 mg   . sodium chloride 0.9% infusion   . tiZANidine (ZANAFLEX) tablet 2 mg       Assessment:   68 year old male s/p XLIF L3-4, L4-5, PISF L3-5 (9/18; Dr. Earnest Conroy). Patient doing well and progressing appropriately.     Plan:  - Immobilization: LSO for mobilization   - Antibiotics: complete peri-operative   - DVT Prophylaxis: no pharmacologic prophylaxis; SCDs/TED hose, ambulation  - Pain Medication: multimodal   - Drain: maintain for now   - Foley: out when mobilizing   - Other: aggressive incentive spirometry  - Dispo: pending decreased drain output, XRs, PT clearance, pain control on oral pain medication, CM clearance      Corky Sing, MD  Resident Physician, Orthopaedic Surgery       Please page the Orthopaedic Spine Surgery Team with questions or concerns based on patient location:     Established spine floor patients at [facility]: Ortho Spine 1 - [phone]      Established spine floor patients at [facility]: Ortho Spine 2 - [phone]      New spine consultations not yet followed by spine team:    View [facility] on call for "SPINE CONSULT"  call schedule (Orthopaedic Spine versus Neurosurgery)   If Orthopaedic Spine is on call please page the [place] on call:   [place]: ORTHOPEDICS/TH   [facility]: ORTHOPEDICS/HC

## 2019-09-07 NOTE — Interdisciplinary (Signed)
Physical Therapy Evaluation    Admitting Physician:  Zlomislic, Vinko, MD  Admission Date 09/06/2019    Inpatient Diagnosis:   Problem List       Codes    Pain     ICD-10-CM: R52  ICD-9-CM: 780.96    Relevant Orders    X-Ray Fluoroscopy Up To 1 Hr - OR (Completed)    Decreased functional mobility     ICD-10-CM: R26.89  ICD-9-CM: 781.99          IP Start of Service   Start of Care: 09/07/19  Onset Date: 09/06/2019  Reason for referral: Decline in functional ability/mobility    Preferred [facility]         Past Medical History:   Diagnosis Date   . Chronic suprapubic catheter (CMS-HCC)    . Congenital hydronephrosis    . Gout    . Headache    . Hematuria    . Kidney disease    . Kidney stones    . Major depressive disorder, single episode    . Polyarthropathy or polyarthritis of multiple sites    . Retinal detachment    . Urethral stricture       Past Surgical History:   Procedure Laterality Date   . CT INSERTION OF SUPRAPUBIC CATH  09/25/2015   . NEPHRECTOMY Right 1995   . APPENDECTOMY     . COLONOSCOPY     . CYSTOSCOPY     . CYSTOSCOPY W/ LASER LITHOTRIPSY     . OTHER SURGICAL HISTORY      Interstim 01/29/2011   . TRANSURETHRAL RESECTION OF PROSTATE         PT Acute     Row Name 09/07/19 1200          Type of Visit    Type of Physical Therapy note  Physical Therapy Evaluation     Row Name 09/07/19 1200          Treatment Precautions/Restrictions    Precautions/Restrictions  Spine;Postsurgical/procedural     Other Precautions/Restrictions Information  LSO OOB, drain, catheter     Row Name 09/07/19 1200          Medical History    History of presenting condition  68 year old male POD#1 s/p XLIF L3-4, L4-5, PISF L3-5      Fall history  No falls reported in the last 6 months     [place] Name 09/07/19 1200          Functional History    Prior Level of Function  No deficits     Equipment required for mobility in the home  None;Cane     Other Functional History Information  Patient recently purchased a cane     Row Name  09/07/19 1200          Social History    Living Situation  Lives with [place] accessibility   Stairs present     Number of steps to enter home  2     Row Name 09/07/19 1200          Subjective    Subjective Information  Patient received supine in bed     Patient status  Patient agreeable to treatment;Nursing in agreement for treatment;Patient pain control adequate to participate in therapy     [place] Name 09/07/19 1200          Pain Assessment    Pain Asssessment Tool  Numeric Pain Rating Scale     Row Name 09/07/19 1200          Numeric Pain Rating Scale    Pain Intensity - rating at present  3     Pain Intensity- rating after treatment  4     Location  low back, premedicated by RN     [place] Name 09/07/19 1200          Objective    Overall Cognitive Status  Intact - no cognitive limitations or impairments noted     Communication  No communication limitations or impairments noted. Current status of hearing, speech and vision allow functional communication.     Coordination/Motor control  No limitations or impairments noted. Movement patterns are fluid and coordinated throughout     Balance  Balance limitations present     Static Sitting Balance  Normal - able to maintain steady balance without handhold support     Dynamic Sitting Balance  Good - accepts moderate challenge, able to maintain balance while picking object off floor     Static Standing Balance  Good - able to maintain balance without handhold support, limited postural sway     Dynamic Standing Balance  Good - accepts moderate challenge, able to maintain balance while picking object off floor     Other Balance Information  Able to maintain static standing balance without UE support     Extremity Assessment  Range of motion, strength,  muscle tone and/or sensation limitations present     LLE findings  WNL     RLE findings  RLE grossly >=3/5 throughout     Other  Extremity Assessment  Information  intact sensation in  BLEs     Functional Mobility  Functional mobility deficits present     Bed Mobility  Supervised     Bed Mobility Comments  via logroll with cues for sequencing     Transfers to/from Stand  Independent     Gait  Supervised     Gait Comments  Patient demonstrates step through gait pattern with good control and no LOB with cane.      Device used for ambulation/mobility  Cane     Ambulation Distance  277ft     Step Navigation  CGA to ascend/descend 3 low stairs with rail and cane               Eval cont.     [place] Name 09/07/19 1200          [place] AM-PAC: Basic Mobility    Assistance Needed to Turn from Back to Side While in a Flat Bed Without Using Bedrails  4 - None (independent)     Difficulty with Supine to Sit Transfer  3 - A little (supervised/min assist)     How Much Help Needed to Move to/from Bed to Chair  4 - None (independent)     Difficulty with Sit to Stand Transfer from Chair with Arms  4 - None (independent)     How Much Help Needed to Walk in Room  4 - None (independent)     How Much Help Needed to Climb 3-5 Steps with a Rail  3 - A little (supervised/min assist)     AMPAC Total Score  22     Assessment: AM-PAC Basic Mobility Impairment Rating  Score 19-22 - 20-39% impaired     Row Name 09/07/19 1200          Patient/Family Education  Learner(s)  Patient     Learner response to rehab patient education interventions  Verbalizes understanding;Able to return demonstrate teaching     Patient/family training comments  role of PT, PT plan of care, spine precautions, donning/doffing LSO     Row Name 09/07/19 1200          Assessment    Assessment  Patient did well with therapy today with ability to ambulate 246ft with supervision and negotiate stairs with cane and rail with CGA. Patient ind at baseline and lives with family however reports having minimal help at home. Patient is presenting below his functional baseline and would benefit from continued therapy to maximize functional mobility, balance and  safety. Anticipate patient will be able to discharge home once medically ready with recommendation for use of cane initially and [facility] PT however patient is requesting to go to a SNF. Inpatient PT will continue to follow and progress while in the hospital upating recommendations and d/c plan as needed.       Rehab Potential  Good     Row Name 09/07/19 1200          Patient stated Goal    Patient stated goal  to go to a rehab facility     [place] Name 09/07/19 1200          Goal 1 (Short Term)    Impairment  Gait impairment     Custom goal  Patient will be Mod I to ambulate household distances with [facility] to promote safe ambulation     Number of visits  3     Goal Status  New     Row Name 09/07/19 1200          Goal 2 (Short Term)    Impairment  Functional mobility limitation     Custom goal  Patient will demonstrate supervision to ascend/descend 2 stairs with [facility] to safely enter/exit home     Number of visits  2     Goal Status  New     Row Name 09/07/19 1200          Goal 3 (Short Term)    Impairment  Functional mobility limitation     Custom goal  Patient will be ind for bed mobility via logroll to promote safe transfers OOB     Number of visits  3     Goal [place] Name 09/07/19 1200          Goal 4 (Short Term)    Impairment  Education need     Custom goal  Patient will be ind to don/doff LSO to promote safe recovery     Number of visits  2     Goal Status  [place] Name 09/07/19 1200          Planned Therapy Interventions and Rationale    Gait Training  to normalize gait pattern and improve safety while ambulating with assistive device;to improve safety with stair navigation     Neuromuscular Re-Education  to improve safety during dynamic activities     Orthotic/Prosthetic Assessment and Training  to improve independence with donning/doffing device and skin care     Therapeutic Activities  to improve functional mobility and ability to navigate in the home and/or community;to improve transfers between  surfaces     Row Name 09/07/19 [place]  Treatment Plan Discussion and Agreement  Patient/family/caregiver stated understanding and agreement with the therapy plan     Row Name 09/07/19 1200          Treatment Plan    Continue therapy to address  Decline in functional ability/mobility     Frequency of treatment  7 times per week     Duration of treatment (number of visits)  2 visits     Status of treatment  Patient evaluated and will benefit from ongoing skilled therapy     [place] Name 09/07/19 1200          Patient Safety Considerations    Patient safety considerations  Patient left sitting at end of treatment;Call light left in reach and fall precautions in place     Patient assistive device requirements for safe ambulation  [place] Name 09/07/19 1200          Therapy Plan Communication    Therapy Plan Communication  Discussed therapy plan with Nursing and/or Physician;Encouraged out of bed with assistance by     Encouraged out of bed with assistance by  Nursing;Staff     Row Name 09/07/19 1200          Physical Therapy Patient Discharge Instructions    Your Physical Therapist suggests the following  Continue to follow your prescribed mobility precautions when moving in and out of bed and walking  as instructed;Supervision with walking is suggested for increased safety;Continue to use your assistive device as instructed when walking to improve your stability and prevent falls     [place] Name 09/07/19 1200          Type of Eval    Low Complexity [id number])  Completed     [place] Name 09/07/19 1200          Therapeutic Procedures    Gait Training [id number])  Assistive device training;Gait pattern analysis and treatment of deviations;Patient education;Postural alighnment/biomechanic training during gait;Stair/curb/obstacle navigation training;Weight shift and postural control activities during gait        Total TIMED Treatment (min)   20     Orthotic Mgmt/Training, initial encounter, each 15  minutes [id number]  Patient education;Skin care instruction;Wear time instruction;Orthotic/splint training        Total TIMED Treatment (min)   10     Row Name 09/07/19 1200          Treatment Time     Treatment start time  1030     Total TIMED Treatment  (min)  30     Total Treatment Time (min)  45         Post Acute Discharge Recommendations  Discharge Rehabilitation Reccomendations ([place] ONLY): If medically appropriate and available, patient demonstrates tolerance to participate in skilled therapy at the following anticipated level  Therapy level: Home health (patient asking to go a SNF)  Equipment recommendations: No equipment needed - patient has own equipment    The physical therapist of record is endorsed by evaluating physical therapist.

## 2019-09-07 NOTE — Plan of Care (Signed)
Problem: Promotion of Health and Safety  Goal: Promotion of Health and Safety  Description: The patient remains safe, receives appropriate treatment and achieves optimal outcomes (physically, psychosocially, and spiritually) within the limitations of the disease process by discharge.    Information below is the current care plan.  Outcome: Progressing  Flowsheets  Taken 09/07/2019 1748  Guidelines: Inpatient Nursing Guidelines  Individualized Interventions/Recommendations #1: Assess and treat pain using multimodal pain management.  Individualized Interventions/Recommendations #2 (if applicable): Educate pt regarding spine precautions and use of TLSO brace when oob.  Individualized Interventions/Recommendations #3 (if applicable): Monitor incision site for drainage, s/s of infection.  Outcome Evaluation (rationale for progressing/not progressing) every shift: Pt a/o x 4, VSS, pain well managed with current regimen.  Pt compliant with spine precautions and use of brace when oob.  Pt ambulated in hall several times throughout the day and was up to the chair for all meals.  Taken 09/07/2019 0730  Patient /Family stated Goal: pain management

## 2019-09-07 NOTE — Interdisciplinary (Signed)
Occupational Therapy Evaluation and Discharge    Admitting Physician:  Zlomislic, Vinko, MD  Admission Date 09/06/2019    Inpatient Diagnosis:   Problem List       Codes    Pain     ICD-10-CM: R52  ICD-9-CM: 780.96    Relevant Orders    X-Ray Fluoroscopy Up To 1 Hr - OR (Completed)    Decreased functional mobility     ICD-10-CM: R26.89  ICD-9-CM: 781.99    Decreased activities of daily living (ADL)     ICD-10-CM: Z78.9  ICD-9-CM: V49.89          IP Start of Service  Start of Care: 09/07/19  Reason for referral: Decline in performance of activities of daily living (ADL)    Preferred [place]         Past Medical History:   Diagnosis Date   . Chronic suprapubic catheter (CMS-HCC)    . Congenital hydronephrosis    . Gout    . Headache    . Hematuria    . Kidney disease    . Kidney stones    . Major depressive disorder, single episode    . Polyarthropathy or polyarthritis of multiple sites    . Retinal detachment    . Urethral stricture       Past Surgical History:   Procedure Laterality Date   . CT INSERTION OF SUPRAPUBIC CATH  09/25/2015   . NEPHRECTOMY Right 1995   . APPENDECTOMY     . COLONOSCOPY     . CYSTOSCOPY     . CYSTOSCOPY W/ LASER LITHOTRIPSY     . OTHER SURGICAL HISTORY      Interstim 01/29/2011   . TRANSURETHRAL RESECTION OF PROSTATE         OT Acute     Row Name 09/07/19 1200          Type of Visit    Type of Occupational Therapy note  Occupational Therapy Evaluation and Discharge     [place] Name 09/07/19 1200          Treatment Time    Treatment Start Time  [id number]     Total TIMED Treatment (min)  30     Total Treatment Time (min)  48     Row Name 09/07/19 1200          Treatment Precautions/Restrictions    Precautions/Restrictions  Fall;Postsurgical/procedural;Spine     Fall  Socks/charm     Other Precautions/Restrictions Information  LSO     Row Name 09/07/19 1200          Medical History    History of presenting condition  68 year old male s/p XLIF L3-4, L4-5, PISF L3-5     Fall history  No falls  reported in the last 6 months     [place] Name 09/07/19 1200          Functional History    Prior Level of Function  Minimal deficits     General ADL/Self-Care Assistance Needs  None- Independent with ADLs and self care     Equipment required for mobility in the home  [place]     Other Functional History Information  Pt bought a hurrycane, states he does not know why he bought it     [place] Name 09/07/19 1200          Social History    Living Situation  Lives alone     [place]  Home accessibility  Accessible with wheelchair or walker;Performs activities of daily living (ADL's) on one level     [place]  Pt reports his living situation is "complicated" and that he essentially lives alone     [place] Name 09/07/19 1200          Subjective    Patient status  Patient agreeable to treatment;Nursing in agreement for treatment     [place] Name 09/07/19 1200          Pain Assessment    Pain Asssessment Tool  Numeric Pain Rating Scale     Row Name 09/07/19 1200          Numeric Pain Rating Scale    Pain Intensity - rating at present  6     Pain Intensity- rating after treatment  7     Location  Spine     Row Name 09/07/19 1200          Activities of Daily Living (ADLs)    Self Feeding  Independent     Self Grooming  Independent     Upper Body Dressing  Independent     Lower Body Dressing  Independent     Bathing  Independent     Toileting  Independent     Toilet Transfers  Independent     [place] Name 09/07/19 1200          [place] AM-PAC: Daily Activity    Assistance Needed to Put on and Take off Regular Lower Body Clothing  4     Assistance Needed to Bathe, Including Washing, Rinsing, and Drying  4     Assistance Needed to Toilet Environmental manager, Bedpan, or Urinal)  4     Assistance Needed to Put on and Take off Regular Upper Body Clothing  4     Assistance Needed to Take Care of Personal Grooming Such as Brushing Teeth  4     Assistance Needed to Eat Meals  4     AM-PAC  Daily Activity Total Score  24     AMP-PAC Daily Activity Impairment rating  Score 24 - 0% impaired     Row Name 09/07/19 1200          Objective    Overall Cognitive Status  At baseline level     Other  Communication Information  Hyperverbal     Coordination/Motor control  No limitations or impairments noted. Movement patterns are fluid and coordinated throughout     Balance  No balance limitations or impairments noted. Patient is able to maintain balance during mobility skills and daily activities.     Extremity Assessment  Flexibility, strength, muscle tone and sensation grossly within functional limits throughout     Functional Mobility  No limitations or impairments in functional mobility noted. Patient independent in mobility activities of daily living     Other Objective Findings  Pt received seated at EOB, agreeable to therapy. LSO donned. Pt able to demo BLE figure four position; reports he wear slip on shoes at home. Independent ambulation within room; pt has hurry cane but observed to be able to ambulate without using it. Pt demonstrated toilet transfer independently. Transferred to chair, practiced donning/doffing LSO brace. Pt with poor adherence to spine precautions; educated on importance of maintaining precautions. Pt seated in chair at end of treatment with all needs in reach.            Eval  cont.     Row Name 09/07/19 1200          Patient/Family Education    Learner(s)  Patient     Learner response to rehab patient education interventions  Verbalizes understanding;Able to return demonstrate teaching     [place] Name 09/07/19 1200          Assessment    Assessment  Pt is independent for all ADLs and functional mobility; limited by spine precautions and pain. Pt educated on role of OT, ADL/home safety, and POC. Pt has no further need for skilled IP OT and is safe to d/c home but is requesting to d/c to SNF.     Rehab Potential  Good     Row Name 09/07/19 1200          Treatment Plan Disussion     Treatment Plan Discussion and Agreement  Patient support system determined and all questions were asked and answered;Patient/family/caregiver stated understanding and agreement with the therapy plan     [place] Name 09/07/19 1200          Treatment Plan    Duration of treatment (number of visits)  One time only, further treatment not indicated     Status of treatment  One time only treatment, further skilled therapy not indicated     [place] Name 09/07/19 1200          Patient Safety Considerations    Patient safety considerations  Patient left sitting at end of treatment;Call light left in reach and fall precautions in place     Patient assistive device requirements for safe ambulation  No device required     [place] Name 09/07/19 1200          Post Acute Discharge Recommendations    Discharge Rehabilitation Reccomendations ([place])  None- patient currently  has no further skilled therapy needs;If medically appropriate and available, patient demonstrates tolerance to participate in skilled therapy at the following anticipated level     Therapy level  Skilled nursing pt requesting to d/c to SNF     Equipment recommendations  No equipment needed - patient has own equipment     [place] Name 09/07/19 1200          Therapy Plan Communication    Therapy Plan Communication  Discussed therapy plan with Nursing and/or Physician     [place] Name 09/07/19 1200          Occupational Therapy Patient Discharge Instructions    Your Occupational Therapist suggests the following  Continue to complete your self care Activities of Daily Living as frequently as possible;Continue to use energy conservation, pursed lip breathing and self-pacing when completing your self care Activities of Daily Living;Continue to follow your prescribed mobility precautions when transferring to the chair and toilet as instructed     [place] Name 09/07/19 1300          Type of Eval    Low Complexity [phone])  Completed     [place] Name 09/07/19 1300          Therapeutic  Procedures    Self-Care/ADL Training [phone])  Activities of daily living training;Dressing;Grooming;Patient education;Personal hygiene;Safety procedures;Self-care activities of dally living         Total TIMED Treatment (min)  30           The occupational therapist of record is endorsed by evaluating occupational therapist.

## 2019-09-07 NOTE — Interdisciplinary (Signed)
Nutrition Note     Note Type: Chewing trigger    Current Diet Rx: Diet Regular    Current diet texture accepted; ONS declined; Relayed findings to RD.    Will continue to follow patient per approved [facility] Nutrition Prioritization Schedule guidelines. Nutrition Services remains available via [place] should patient medical status change.    Synetta Fail, DTR  09/07/19

## 2019-09-08 ENCOUNTER — Inpatient Hospital Stay (HOSPITAL_COMMUNITY): Payer: Medicare Other

## 2019-09-08 DIAGNOSIS — Z981 Arthrodesis status: Secondary | ICD-10-CM

## 2019-09-08 LAB — HEMOGRAM, BLOOD
Hct: 22.5 % — ABNORMAL LOW (ref 40.0–50.0)
Hgb: 7.5 gm/dL — ABNORMAL LOW (ref 13.7–17.5)
MCH: 29.6 pg (ref 26.0–32.0)
MCHC: 33.3 g/dL (ref 32.0–36.0)
MCV: 88.9 um3 (ref 79.0–95.0)
MPV: 10.9 fL (ref 9.4–12.4)
Plt Count: 120 10*3/uL — ABNORMAL LOW (ref 140–370)
RBC: 2.53 10*6/uL — ABNORMAL LOW (ref 4.60–6.10)
RDW: 15.2 % — ABNORMAL HIGH (ref 12.0–14.0)
WBC: 15 10*3/uL — ABNORMAL HIGH (ref 4.0–10.0)

## 2019-09-08 NOTE — Progress Notes (Signed)
Orthopaedic Spine Surgery Progress Note for Stephen [name]  Current Length of Stay:   2 days - Admitted on: 09/06/2019    Subjective:  Continues to have incisional back pain without leg symptoms when at rest, when standing or ambulating he continues to have anterior right thigh symptoms similar to pre-op. Drain with decreasing output, now 185/170 mL SS. H/H 7.5/22.5 from 8.7/26.5    Objective:  Vital Signs:  BP 99/53 (BP Location: Left arm, BP Patient Position: Semi-Fowlers)   Pulse 62   Temp 98.2 F (36.8 C)   Resp 18   Ht 5\' 7"  (1.702 m)   Wt 94.3 kg (208 lb)   SpO2 98%   BMI 32.58 kg/m     Physical Exam:  General: patient awake, alert, and responding to commands; no apparent distress  Cardio: regular rate and rhythm per pulse  Respiratory: patient breathing quietly without use of accessory muscles  Neck/Back: incisional dressing c/d/i, drain dressing with mild SS strike through    Bilateral Lower Extremity   Motor:       Right           Left        Iliopsoas (L2/3)                     5/5           5/5         Quadriceps (L4)                    5/5           5/5        Tibialis Anterior (L4/5)           5/5           5/5        EHL (L5)                               5/5           5/5        GSC (S1)                              5/5           5/5        FHL (S2)                               5/5           5/5   Sensation:        SILT in L2-S15 distributions.    Laboratory Data:   Lab Results   Component Value Date    WBC 15.0 (H) 09/08/2019    HGB 7.5 (L) 09/08/2019    HCT 22.5 (L) 09/08/2019    PLT 120 (L) 09/08/2019     Input/Output:      Intake/Output Summary (Last 24 hours) at 09/08/2019 0820  Last data filed at 09/08/2019 0559  Gross per 24 hour   Intake 900 ml   Output 2380 ml   Net -1480 ml       Current Medications:  Current Facility-Administered Medications   Medication   . acetaminophen (TYLENOL) tablet 650 mg    Or   . acetaminophen (TYLENOL) solution 650 mg  Or   . acetaminophen (TYLENOL)  suppository 650 mg   . acetaminophen (TYLENOL) tablet 975 mg   . amLODIPINE (NORVASC) tablet 5 mg   . ascorbic acid (VITAMIN C) tablet 500 mg   . atenolol (TENORMIN) tablet 25 mg   . bisacodyl (DULCOLAX) suppository 10 mg   . ceFAZolin (ANCEF) 2,000 mg in sodium chloride 0.9 % 100 mL IVPB   . cetirizine (ZYRTEC) tablet 10 mg   . docusate sodium (COLACE) capsule 250 mg   . famotidine (PEPCID) IVPB 20 mg    Or   . famotidine (PEPCID) tablet 20 mg   . gabapentin (NEURONTIN) capsule 300 mg   . magnesium hydroxide (MILK OF MAGNESIA) suspension 30 mL   . menthol (CEPACOL) lozenge 3 mg   . multivitamin tablet 1 tablet   . ondansetron (ZOFRAN) injection 4 mg   . oxyCODONE (ROXICODONE) tablet 10 mg   . oxyCODONE (ROXICODONE) tablet 5 mg   . polyethylene glycol (MIRALAX) packet 17 g   . psyllium (METAMUCIL) 58.12 % packet 1 packet   . senna (SENOKOT) tablet 17.2 mg   . sodium chloride 0.9% infusion   . tiZANidine (ZANAFLEX) tablet 2 mg     Assessment:   68 year old male s/p XLIF L3-4, L4-5, PISF L3-5 (9/18; Dr. Earnest Conroy). Patient doing well and progressing appropriately.     Plan:  - Immobilization: LSO for mobilization   - Antibiotics: continue while drain in place  - DVT Prophylaxis: no pharmacologic prophylaxis; SCDs/TED hose, ambulation  - Pain Medication: multimodal   - Drain: to self suction, strip and record Qshift  - Other: aggressive incentive spirometry  - Dispo: pending decreased drain output, XRs, PT clearance, pain control on oral pain medication, CM clearance    Ane Payment, MD  Orthopedic Spine Fellow    Please page the Orthopaedic Spine Surgery Team with questions or concerns based on patient location:     Established spine floor patients at [place]: Ortho Spine 1 - [phone]      Established spine floor patients at [facility]: Ortho Spine 2 - [phone]      New spine consultations not yet followed by spine team:    [facility] on call for "SPINE CONSULT" call schedule (Orthopaedic Spine versus  Neurosurgery)   If Orthopaedic Spine is on call please page the [place] on call:   [place]: ORTHOPEDICS/TH   [place]: ORTHOPEDICS/HC

## 2019-09-08 NOTE — Interdisciplinary (Signed)
Physical Therapy Daily Treatment Note    Admitting Physician:  Zlomislic, Vinko, MD  Admission Date 09/06/2019    Inpatient Diagnosis:   Problem List       Codes    Pain     ICD-10-CM: R52  ICD-9-CM: 780.96    Relevant Orders    X-Ray Fluoroscopy Up To 1 Hr - OR (Completed)    Decreased functional mobility     ICD-10-CM: R26.89  ICD-9-CM: 781.99    Decreased activities of daily living (ADL)     ICD-10-CM: Z78.9  ICD-9-CM: V49.89          IP Start of Service   Start of Care: 09/07/19  Onset Date: 09/06/2019  Reason for referral: Decline in functional ability/mobility    Preferred Bowlegs         Past Medical History:   Diagnosis Date   . Chronic suprapubic catheter (CMS-HCC)    . Congenital hydronephrosis    . Gout    . Headache    . Hematuria    . Kidney disease    . Kidney stones    . Major depressive disorder, single episode    . Polyarthropathy or polyarthritis of multiple sites    . Retinal detachment    . Urethral stricture       Past Surgical History:   Procedure Laterality Date   . CT INSERTION OF SUPRAPUBIC CATH  09/25/2015   . NEPHRECTOMY Right 1995   . APPENDECTOMY     . COLONOSCOPY     . CYSTOSCOPY     . CYSTOSCOPY W/ LASER LITHOTRIPSY     . OTHER SURGICAL HISTORY      Interstim 01/29/2011   . TRANSURETHRAL RESECTION OF PROSTATE         PT Acute     Row Name 09/08/19 1200          Type of Visit    Type of Physical Therapy note  Physical Therapy Daily Treatment Note     Row Name 09/08/19 1200          Treatment Precautions/Restrictions    Precautions/Restrictions  Spine;Postsurgical/procedural     Other Precautions/Restrictions Information  LSO OOB     Row Name 09/08/19 1200          Medical History    Fall history  No falls reported in the last 6 months     Stephen [name] Name 09/08/19 1200          Functional History    Prior Level of Function  No deficits     Equipment required for mobility in the home  None;Cane     Other Functional History Information  Patient recently purchased a cane     Row Name  09/08/19 1200          Social History    Living Situation  Lives with parent/family     Stephen [name] accessibility   Stairs present     Number of steps to enter home  2     Row Name 09/08/19 1200          Subjective    Subjective Information  states bed at where he is staying is in bad shape.      Patient status  Patient agreeable to treatment;Nursing in agreement for treatment;Patient pain control adequate to participate in therapy     Stephen [name] Name 09/08/19 1200          Pain Assessment  Pain Asssessment Tool  Numeric Pain Rating Scale     Row Name 09/08/19 1200          Numeric Pain Rating Scale    Pain Intensity - rating at present  2     Pain Intensity- rating after treatment  2     Location  low back, premedicated by RN     Stephen [name] Name 09/08/19 1200          Objective    Overall Cognitive Status  Intact - no cognitive limitations or impairments noted     Communication  No communication limitations or impairments noted. Current status of hearing, speech and vision allow functional communication.     Coordination/Motor control  No limitations or impairments noted. Movement patterns are fluid and coordinated throughout     Balance  Balance limitations present     Static Sitting Balance  Normal - able to maintain steady balance without handhold support     Dynamic Sitting Balance  Good - accepts moderate challenge, able to maintain balance while picking object off floor     Static Standing Balance  Good - able to maintain balance without handhold support, limited postural sway     Dynamic Standing Balance  Good - accepts moderate challenge, able to maintain balance while picking object off floor     Other Balance Information  Able to maintain static standing balance without UE support     Extremity Assessment  Range of motion, strength,  muscle tone and/or sensation limitations present     LLE findings  WNL     RLE findings  WNL     Other  Extremity Assessment  Information  intact sensation in BLEs       Functional Mobility  Functional mobility deficits present     Bed Mobility  Supervised     Bed Mobility Comments  via logroll with cues for sequencing     Transfers to/from Stand  Independent     Transfer Comments  able to transfer bed<->chair w/o AD     Gait  Modified independent     Gait Comments  Patient demonstrates step through gait pattern with good control and no LOB with cane. Able to walk from bed to chair w/o AD and good stability     Device used for ambulation/mobility  [employer]     Ambulation Distance  300     Step Navigation  Mod Indep with rail and cane     Other Objective Findings  Brace prep, fitting. standing heel raises with hand on support surface. Extensive education and practice in logroll technique, and brace management. Left up in a chair, all needs in reach.                Eval cont.     Stephen [name] Name 09/08/19 1200          [place] AM-PAC: Basic Mobility    Assistance Needed to Turn from Back to Side While in a Flat Bed Without Using Bedrails  4 - None (independent)     Difficulty with Supine to Sit Transfer  3 - A little (supervised/min assist)     How Much Help Needed to Move to/from Bed to Chair  4 - None (independent)     Difficulty with Sit to Stand Transfer from Chair with Arms  4 - None (independent)     How Much Help Needed to Walk in Room  4 - None (independent)     How Much  Help Needed to Climb 3-5 Steps with a Rail  3 - A little (supervised/min assist)     AMPAC Total Score  22     Assessment: AM-PAC Basic Mobility Impairment Rating  Score 19-22 - 20-39% impaired     Row Name 09/08/19 1200          Patient/Family Education    Learner(s)  Patient     Learner response to rehab patient education interventions  Verbalizes understanding;Able to return demonstrate teaching     Patient/family training comments  role of PT, PT plan of care, spine precautions, donning/doffing LSO     Row Name 09/08/19 1200          Assessment    Assessment Patient is mobilizing and ambulating well with a SPC  outside the room and no AD at bedside. Able to don/doff own brace, manage 2 steps with hand rail and cane. Required cueing for logroll sequencing and technique. Patient is reluctant to go home but from a PT prospective he is appropriate for DC home with his own cane, Standby supervision, and HHPT. Will benefit from ongoing IPPT while he remains in-house mainly to re-enforce logroll technique.      Rehab Potential  Excellent     Row Name 09/08/19 1200          Patient stated Goal    Patient stated goal  stay        Planned Therapy Interventions and Rationale    Gait Training  to normalize gait pattern and improve safety while ambulating with assistive device;to improve safety with stair navigation     Neuromuscular Re-Education  to improve safety during dynamic activities     Orthotic/Prosthetic Assessment and Training  to improve independence with donning/doffing device and skin care     Therapeutic Activities  to improve functional mobility and ability to navigate in the home and/or community;to improve transfers between surfaces     Row Name 09/08/19 1200          Treatment Plan Disussion    Treatment Plan Discussion and Agreement  Patient/family/caregiver stated understanding and agreement with the therapy plan     Row Name 09/08/19 1200          Treatment Plan    Continue therapy to address  Decline in functional ability/mobility     Frequency of treatment  7 times per week     Duration of treatment (number of visits)  2 visits     Status of treatment  Patient evaluated and will benefit from ongoing skilled therapy     Row Name 09/08/19 1200          Patient Safety Considerations    Patient safety considerations  Patient left sitting at end of treatment;Call light left in reach and fall precautions in place     Patient assistive device requirements for safe ambulation  Odelia Gage Name 09/08/19 1200          Therapy Plan Communication    Therapy Plan Communication  Discussed therapy plan with Nursing and/or  Physician;Encouraged out of bed with assistance by     Encouraged out of bed with assistance by  Nursing;Staff     Row Name 09/08/19 1200          Physical Therapy Patient Discharge Instructions    Your Physical Therapist suggests the following  Continue to follow your prescribed mobility precautions when moving in and out of bed and walking  as  instructed;Supervision with walking is suggested for increased safety;Continue to use your assistive device as instructed when walking to improve your stability and prevent falls     Row Name 09/08/19 1200          Type of Eval    Low Complexity [phone])  Completed     Row Name 09/08/19 1200          Therapeutic Procedures    Gait Training [phone])  Assistive device training;Gait pattern analysis and treatment of deviations;Patient education;Postural alighnment/biomechanic training during gait;Stair/curb/obstacle navigation training;Weight shift and postural control activities during gait        Total TIMED Treatment (min)   20     Neuromuscular re-education [id number])   Anticipatory postural adjustment training;Balance activities to improve control of center of gravity over base of support;Coordination activities;Dynamic balance training;Patient education;Postural control activities in relation to gravity and surgaces;Static balance training;Trunk alighment/stability activities        Total TIMED Treatment (min)   10     Therapeutic Activities ([id number])   Assistance/facilitation of bed mobility;Manual dexterity task training;Patient education        Total TIMED Treatment (min)   10     Therapeutic exercise  ([id number])   Balance reaches - lower extremity;Strengthening exercises        Total TIMED Treatment (min)   5     Orthotic Mgmt/Training, initial encounter, each 15 minutes [id number])  Patient education;Skin care instruction;Wear time instruction;Orthotic/splint training        Total TIMED Treatment (min)   10     Row Name 09/08/19 1200          Treatment Time     Treatment start  time  1030     Total TIMED Treatment  (min)  45     Total Treatment Time (min)  45         Post Acute Discharge Recommendations  Discharge Rehabilitation Reccomendations ([place] ONLY): If medically appropriate and available, patient demonstrates tolerance to participate in skilled therapy at the following anticipated level  Therapy level: Home health  Equipment recommendations: No equipment needed - patient has own equipment    The physical therapist of record is endorsed by evaluating physical therapist.

## 2019-09-09 MED ORDER — OXYCODONE HCL 5 MG OR TABS
5.0000 mg | ORAL_TABLET | ORAL | Status: DC | PRN
Start: 2019-09-09 — End: 2019-09-12
  Administered 2019-09-09 – 2019-09-11 (×4): 5 mg via ORAL
  Filled 2019-09-09 (×4): qty 1

## 2019-09-09 NOTE — Op Note (Addendum)
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 After anesthetic induction, sterile electrodes were placed for purposes of multimodal stimulation and recording.  Per the neurologist, baseline finding under anesthesia & intraoperative course by modality were reported to the surgeon  and anesthesiologist and a verbal acknowledgement received (details below):  -  Neuromuscular Junction (NMJ) testing: NMJ testing was explored at the [place] at baseline.  The reading neurologist provided comment at their discretion and as appropriate, and baseline NMJ responses were  communicated to the surgeon as ADEQUATE for motor element monitoring.  -  Electromyography (EMG): Recording electrodes were placed bilaterally in the following muscles to record free running  audible and visual electromyography (EMG):  Lower Extremity: PSOAS VASTUS LATERALIS TIBIALIS ANTERIOR ABDUCTOR HALLUCIS  ABDUCTOR DIGITI MINIMI PERONEUS LONGUS  The reading neurologist provided comment at their discretion and as appropriate, and baseline EMG responses were  communicated to the surgeon as ADEQUATE THROUGHOUT. See post-procedural notes for subsequent  procedural details.  -  Pedicle  Screw Testing: Triggered EMG stimulation of hardware was used to assess for compromise of the spinal cord or  associated roots. A baseline stimulation threshold of 92mA was utilized, and all thresholds were at or above this level.  The reading neurologist provided comment at their discretion and as appropriate, and the threshold responses  were communicated to the surgeon with acknowledgement (Please note strict criteria for appropriate placement can be  applied only to lumbar and sacral levels, otherwise at the discretion of the surgeon and/or reading neurologist).  -  Motor Evoked Potentials (MEP): Bite block placement was CONFIRMED. Stimulating electrodes were placed in the  scalp over the primary motor cortex to generate baseline trans-cranial electric MEPs with a centrally facilitated double  train of 2 and 7, an inter-train interval of 20uS, a pulse width of 75uS, and stimulation of 280V on the left & 280V on the  right. An attempt was made to record compound muscle action potentials bilaterally from the musculature noted below:  Upper Extremities: THENAR HYPOTHENAR  Lower Extremity: PSOAS VASTUS LATERALIS TIBIALIS ANTERIOR ABDUCTOR HALLUCIS  ABDUCTOR DIGITI MINIMI PERONEUS LONGUS  A reliable, reproducible MEP is not always generated from every muscle at baseline in patients due to multiple variables  (individual response to anesthesia, proximal musculature, underlying pathology, etc).  The reading neurologist provided comment at their discretion and as appropriate, and baseline MEP responses were  communicated to the surgeon as ADEQUATE THROUGHOUT. See post-procedural notes for subsequent  procedural details.  -  Somatosensory Evoked Potentials (SEP):  Upper Extremity SEPs: Bilateral ULNAR nerves were stimulated to generate a cortical SEP. Baseline stimulation  parameters included a repetition rate of 2.38 Hz, pulse duration of 427mS, with current intensities of 32mA on the left and  68mA on the right. First  cortical negativity (N20)  response latency was 21.1mS on the left & 22.35mS on the right, with  amplitudes of .73uV on the left & .94uV on the right.  Lower Extremity SEPs: Bilateral POSTERIOR TIBIAL nerves were stimulated to generate a cortical SEP. Baseline  stimulation parameters included a repetition rate of 2.38 Hz, pulse duration of 545mS, with current intensities of 82mA on  the left and 78mA on the right. First cortical positivity (P37) response latency was 47.89mS on the left & 46.69mS on the  right, with amplitudes of .88uV on the left &1.21uV on the right.  The reading neurologist provided comment at their discretion and as appropriate, and baseline SEP responses were  communicated to the surgeon as ADEQUATE THROUGHOUT. See post-procedural notes for subsequent  procedural details.  -  Electroencephalography (EEG): Scalp EEG was recorded from EIGHT channels covering the head. At baseline, per  neurologist, the overall voltage was NORMAL, the tracing was bilaterally SYMMETRICAL, and the frequency spectrum  varied between 4HZ  and 12HZ . Anesthesia was given information on presence of burst-suppression (B/S) in order to  assist in their determination of anesthetic depth; at baseline this was NO BURST SUPPRESSION.  The reading neurologist provided comment at their discretion and as appropriate, and this was communicated to  anesthesia/surgeons as appropriate. See post-procedural notes for subsequent details.  -  PROCEDURAL COURSE AND NOTES:  Per the neurologist, the surgeon and/or anesthesiologist was informed of the following post procedural notes and  acknowledged.  -  ANESTHETIC FADE: Anesthetic fade was WAS NOT OBSERVED in this case.  -  NMJ: NMJ demonstrated ADEQUATE motor responsiveness throughout the case.  -  EMG: NO SIGNIFICANT EMG ACTIVITY  -  MEP: NO ADVERSE EVENTS WERE OBSERVED  -  SEP: NO ADVERSE EVENTS WERE OBSERVED  -  EEG: NO ADVERSE EVENTS WERE OBSERVED Anesthesia staff was given data on B/S  at multiple points during  the procedure, as requested, in order to assist in their determination of anesthetic depth (the primary purpose for EEG in  this procedure).  -  Please see Professional Report below for neurologist interpretation.  -  PROFESSIONAL INTERPRETATION: (Dr. Barron Alvine MD)    Baseline Waveforms: (evaluated under conditions of general anesthesia unless otherwise noted):  NMJ Testing: Within normal limits/unremarkable.  Free Run EMG: Within normal limits/unremarkable.  MEP: Within normal limits/unremarkable in musculature wherein reliable baseline signals generated.  SEP: Within normal limits/unremarkable.  EEG: Within normal limits/unremarkable. Anesthetic depth was evaluated with EEG and discussed with anesthesia as appropriate, this was the primary purpose for EEG testing and considered essential to providing assistance to anesthesia in order to obtain optimal cortically-mediated evoked responses.     Post Procedural Notes:  NMJ Testing: The waveforms were substantively unchanged from baseline. No adverse electrodiagnostic events were encountered during NMJ testing/monitoring.  Free Run EMG: The waveforms were substantively unchanged from baseline. No adverse electrodiagnostic events were encountered during monitoring.  Triggered EMG/Direct Nerve Stimulation: Triggered EMG/direct Nerve stimulation successfully performed and screw testing reported as noted above.  MEP: The waveforms were substantively unchanged from baseline. No adverse electrodiagnostic events were encountered during monitoring.  SEP: The waveforms were substantively unchanged from baseline. No adverse electrodiagnostic events were encountered during monitoring.  EEG: EEG varied with the anesthetic regimen and multiple variables as above; anesthesia was notified as data requested to assist in their determination of anesthetic depth. No adverse electrodiagnostic events were encountered during monitoring.    IMPRESSION: This  was a successful multimodal intra-operative neurophysiological monitoring study. There was  no substantive evidence of intra-operative impairment of neural structures based upon monitoring in the modalities above, & further clinical correlation is recommended.    Perioperative remote real-time monitoring dedicated personally to this patient alone performed for 2 hour(s) & 23 minute(s).    In-room monitoring dedicated personally to this patient alone performed for  23  minute(s).        Barron Alvine MD    Associate Professor of Neurosciences  Director, Interventional Neurophysiology Service  [facility]

## 2019-09-09 NOTE — Discharge Instructions (Signed)
Surgeon:   Dr. Lelon Huh     Follow-up Appointment:  Your clinic appointment will be:    Future Appointments   Date Time Provider [place]   [id number]  [id number] PM Zlomislic, Illa Level, MD KOP Rosiland Oz       If this appointment was not scheduled while you were in the hospital then our clinic scheduler will call you in the next few days to schedule this appointment. If you do not hear from Stephen [name] within 3 days you should call the clinic at [phone] (for [facility]) or [phone] (for [place]). You should see Stephen [name] in clinic within 2 weeks from hospital discharge.       Reasons to Contact a Doctor Urgently:     Call 911 or return to the hospital immediately if: If you have chest pain, sudden onset shortness of breath, difficulty breathing, dizziness, lightheadedness, change in mental status or any other concerning issues.    If you develop a fever (>101), redness or drainage from the surgical incision site, swelling or redness of an extremity, profuse bleeding or excessive drainage from incisional site, or other concerning issues please call our office to arrange for an evaluation or go to the emergency department.     If you have any questions about your hospital care, your medications, or if you have new or concerning symptoms soon after going home from the hospital, your hospital physician can be contacted in the following manner:   [facility] operator at [phone].  [place] Phone Number: [phone] ([place]) or [phone] [facility]).    Once you are able to see your primary care physician (PCP), your PCP will then be responsible for further medication refills, or appointment referrals.      General Instructions for After Discharge    Your activity level at home should be:  As tolerated with no bending, twisting, or lifting over 5 lbs. (approximately a gallon of milk).    If you were given instructions to wear a back brace, please wear the brace when out of bed  except when in the shower.    Wound Care/Hygiene:  Please leave your surgical dressing on for 5 days from the date of surgery. No showering during that time. Sponge baths only.     Keep dressing clean and dry at all time.     Prior to discharge, ask your nurse to provide you with extra bandages should the existing one get dirty or fall off.    On day 5, take your dressing off and you may shower. Please pat the wound dry with a clean towel so you do not disturb any of the scab formation. Do not pull at the Dermabond, but leave it in place until it falls off on its own.  Cover the incision with sterile guaze and paper tape and change dressing daily until your first post operative visit.    Please call Stephen [name] if there is redness, swelling or drainage.     If any concerns with showering, then OK to leave all dressings in place and sponge bathe only.    Do not bathe in a tub or swim or Jacuzzi (ie do not submerge the incision) until instructed to do so by your surgeon.    Do not use ointments or creams on the incision.      You may notice some bruising around the incision.  This is not uncommon and should begin to go away within the first 2 weeks after  surgery.      Diet:  Your diet at home should be a regular diet.      Medications:    Take medications as prescribed. The short acting narcotic pain medication (usually Oxycodone) can be continued as needed, not to exceed the directions on the prescription (usually up to every 4 hours or 6 pills per day). To help wean off of the pain medications or to supplement your pain control you can use Tylenol to help with pain. If you run out of medication before your follow up visit, please let our clinic know so that we may authorize a refill if indicated. Please keep in mind it may take up to 72 hours for a refill to be authorized.    You may be given a one time prescription for Lidocaine patches. These are usually only covered once by insurance. However, 4% lidocaine patches can be  purchased over the counter if you run out.    Your medication list is located on this After Visit Summary in the Current Discharge Medication List section.  Your nurse will review this information with you before you leave the hospital.    It is very important for you to keep a current medication list with you in order to assist your doctors with your medical care.  Bring this After Visit Summary with you to your follow up appointments.    While you are taking the narcotic pain medication, take the stool softener and drink plenty of water to prevent constipation.    Do not drive while on narcotic medications.    Do not mix narcotic medications with alcohol.       What Needs to Happen Next After Discharge -- Appointments and Follow Up    You should have an office appointment about 2 weeks following your discharge from the hospital.  If you do not please call [phone] ([place]) or [phone] [facility]).     Generally, you should return to see your surgeon at the following intervals, but this may be individualized depending on special circumstances.    Any appointments already scheduled at [facility] clinics will be listed in the Future Appointments section at the top of this After Visit Summary.  Any appointments that have been requested, but have not yet been scheduled, will be listed below that under Post Discharge Referrals.

## 2019-09-09 NOTE — Plan of Care (Signed)
Problem: Promotion of Health and Safety  Goal: Promotion of Health and Safety  Description: The patient remains safe, receives appropriate treatment and achieves optimal outcomes (physically, psychosocially, and spiritually) within the limitations of the disease process by discharge.    Information below is the current care plan.  Outcome: Progressing  Flowsheets  Taken 09/09/2019 1458 by Towanda Octave, RN  Individualized Interventions/Recommendations #2 (if applicable): instructed pt to use call light for assistance  Taken 09/09/2019 0840 by Towanda Octave, RN  Patient /Family stated Goal: pain control  Taken 09/07/2019 1748 by Mackie Pai, RN  Guidelines: Inpatient Nursing Guidelines  Individualized Interventions/Recommendations #1: Assess and treat pain using multimodal pain management.  Individualized Interventions/Recommendations #3 (if applicable): Monitor incision site for drainage, s/s of infection.  Outcome Evaluation (rationale for progressing/not progressing) every shift: Pt a/o x 4, VSS, pain well managed with current regimen.  Pt compliant with spine precautions and use of brace when oob.  Pt ambulated in hall several times throughout the day and was up to the chair for all meals.

## 2019-09-09 NOTE — Interdisciplinary (Signed)
Physical Therapy Daily Treatment Note    Admitting Physician:  Zlomislic, Vinko, MD  Admission Date 09/06/2019    Inpatient Diagnosis:   Problem List       Codes    Pain     ICD-10-CM: R52  ICD-9-CM: 780.96    Relevant Orders    X-Ray Fluoroscopy Up To 1 Hr - OR (Completed)    Decreased functional mobility     ICD-10-CM: R26.89  ICD-9-CM: 781.99    Decreased activities of daily living (ADL)     ICD-10-CM: Z78.9  ICD-9-CM: V49.89    Spinal stenosis of lumbar region, unspecified whether neurogenic claudication present     ICD-10-CM: M48.061  ICD-9-CM: 724.02    Cauda equina compression (CMS-HCC)     ICD-10-CM: G83.4  ICD-9-CM: 344.60    Nerve root compression     ICD-10-CM: G54.9  ICD-9-CM: 729.2          IP Start of Service   Start of Care: 09/07/19  Onset Date: 09/06/2019  Reason for referral: Decline in functional ability/mobility    Preferred Language:English         Past Medical History:   Diagnosis Date   . Chronic suprapubic catheter (CMS-HCC)    . Congenital hydronephrosis    . Gout    . Headache    . Hematuria    . Kidney disease    . Kidney stones    . Major depressive disorder, single episode    . Polyarthropathy or polyarthritis of multiple sites    . Retinal detachment    . Urethral stricture       Past Surgical History:   Procedure Laterality Date   . CT INSERTION OF SUPRAPUBIC CATH  09/25/2015   . NEPHRECTOMY Right 1995   . APPENDECTOMY     . COLONOSCOPY     . CYSTOSCOPY     . CYSTOSCOPY W/ LASER LITHOTRIPSY     . OTHER SURGICAL HISTORY      Interstim 01/29/2011   . TRANSURETHRAL RESECTION OF PROSTATE         PT Acute     Row Name 09/09/19 1300          Type of Visit    Type of Physical Therapy note  Physical Therapy Daily Treatment Note     Row Name 09/09/19 1300          Treatment Precautions/Restrictions    Precautions/Restrictions  Spine;Postsurgical/procedural     Other Precautions/Restrictions Information  LSO OOB     Row Name 09/09/19 1300          Medical History    Fall history  No falls reported  in the last 6 months     [place] Name 09/09/19 1300          Functional History    Prior Level of Function  No deficits     Equipment required for mobility in the home  None;Cane     Other Functional History Information  Patient recently purchased a cane     Row Name 09/09/19 1300          Social History    Living Situation  Lives with parent/family     [place] accessibility   Stairs present     Number of steps to enter home  2     Row Name 09/09/19 1300          Subjective    Subjective Information  Pt received in supine and agreeable to PT     Patient status  Patient agreeable to treatment;Nursing in agreement for treatment;Patient pain control adequate to participate in therapy     [place] Name 09/09/19 1300          Pain Assessment    Pain Asssessment Tool  Numeric Pain Rating Scale     Row Name 09/09/19 1300          Numeric Pain Rating Scale    Pain Intensity - rating at present  6     Pain Intensity- rating after treatment  5     Location  low back, premedicated by RN     [place] Name 09/09/19 1300          Objective    Overall Cognitive Status  Intact - no cognitive limitations or impairments noted     Communication  No communication limitations or impairments noted. Current status of hearing, speech and vision allow functional communication.     Coordination/Motor control  No limitations or impairments noted. Movement patterns are fluid and coordinated throughout     Balance  Balance limitations present     Static Sitting Balance  Normal - able to maintain steady balance without handhold support     Dynamic Sitting Balance  Good - accepts moderate challenge, able to maintain balance while picking object off floor     Static Standing Balance  Good - able to maintain balance without handhold support, limited postural sway     Dynamic Standing Balance  Good - accepts moderate challenge, able to maintain balance while picking object off floor     Other Balance Information  Able to maintain static  standing balance without UE support     Functional Mobility  Functional mobility deficits present     Bed Mobility  Supervised     Bed Mobility Comments  via logroll with HOB flat and no rails, cues for sequencing     Transfers to/from Stand  Independent     Transfer Comments  able to transfer bed<->chair w/o AD     Gait  Modified independent     Gait Comments  Pt able to ambulate 400' x with SPC with SUPV, pt presents with steady step through pattern with no LOB noted during directional changes.     Device used for ambulation/mobility  [employer]  400     Step Navigation  Mod Indep with rail and cane 3 steps.     Other Objective Findings  Pt able to recall 3/3 spinal precaution with good compliance throughout. Pt able to don/dof LSO independently. Pt with good retrun demo of BEL seated exercises. Pt asymtpomatic throughout treatment with VSS. Pt left sitting in chair with all needs met, call light in hand and in NAD.               Eval cont.     [place] Name 09/09/19 1300          [place] AM-PAC: Basic Mobility    Assistance Needed to Turn from Back to Side While in a Flat Bed Without Using Bedrails  4 - None (independent)     Difficulty with Supine to Sit Transfer  3 - A little (supervised/min assist)     How Much Help Needed to Move to/from Bed to Chair  4 - None (independent)     Difficulty with Sit to Stand Transfer from Chair with Arms  4 - None (  independent)     How Much Help Needed to Walk in Room  4 - None (independent)     How Much Help Needed to Climb 3-5 Steps with a Rail  3 - A little (supervised/min assist)     AMPAC Total Score  22     Assessment: AM-PAC Basic Mobility Impairment Rating  Score 19-22 - 20-39% impaired     Row Name 09/09/19 1300          Patient/Family Education    Learner(s)  Patient     Learner response to rehab patient education interventions  Verbalizes understanding;Able to return demonstrate teaching     Patient/family training comments  role of PT, PT plan of care,  spine precautions, donning/doffing LSO     Row Name 09/09/19 1300          Assessment    Assessment  Pt demonstrating good activity tolerance with PT this date as indicated by meeting all IPPT goals. Pt overall Indep-SUPV for all functional mobility. Pt will be D/C from IPPT caseload, PTA to consult with primary PT for further PT needs. Anticipate patient will be able to discharge home once medically ready with recommendation for use of cane initially and HH PT if available.     Rehab Potential  Excellent     Row Name 09/09/19 1300          Patient stated Goal    Patient stated goal  to walk     [place] Name 09/09/19 1300          Goal 1 (Short Term)    Custom goal  Patient will be Mod I to ambulate household distances with [facility] to promote safe ambulation     Goal Status  Met     Row Name 09/09/19 1300          Goal 2 (Short Term)    Custom goal  Patient will demonstrate supervision to ascend/descend 2 stairs with SPC to safely enter/exit home     Goal Status  Met     Row Name 09/09/19 1300          Goal 3 (Short Term)    Custom goal  Patient will be ind for bed mobility via logroll to promote safe transfers OOB     Goal Status  Met     Row Name 09/09/19 1300          Goal 4 (Short Term)    Custom goal  Patient will be ind to don/doff LSO to promote safe recovery     Goal Status  Met     Row Name 09/09/19 1300          Planned Therapy Interventions and Rationale    Gait Training  to normalize gait pattern and improve safety while ambulating with assistive device;to improve safety with stair navigation     Neuromuscular Re-Education  to improve safety during dynamic activities     Orthotic/Prosthetic Assessment and Training  to improve independence with donning/doffing device and skin care     Therapeutic Activities  to improve functional mobility and ability to navigate in the home and/or community;to improve transfers between surfaces     Row Name 09/09/19 1300          Treatment Plan Disussion    Treatment Plan  Discussion and Agreement  Patient/family/caregiver stated understanding and agreement with the therapy plan     Row Name 09/09/19 1300  Treatment Plan    Frequency of treatment  Patient appropriate for discharge from therapy     Status of treatment  Patient appropriate for discharge from therapy     [place] Name 09/09/19 1300          Patient Safety Considerations    Patient safety considerations  Patient left sitting at end of treatment;Call light left in reach and fall precautions in place     Patient assistive device requirements for safe ambulation  [place] Name 09/09/19 1300          Therapy Plan Communication    Therapy Plan Communication  Discussed therapy plan with Nursing and/or Physician;Encouraged out of bed with assistance by     Encouraged out of bed with assistance by  Nursing;Staff     Row Name 09/09/19 1300          Physical Therapy Patient Discharge Instructions    Your Physical Therapist suggests the following  Continue to follow your prescribed mobility precautions when moving in and out of bed and walking  as instructed;Supervision with walking is suggested for increased safety;Continue to use your assistive device as instructed when walking to improve your stability and prevent falls     Row Name 09/09/19 1300          Therapeutic Procedures    Gait Training [phone])  Assistive device training;Gait pattern analysis and treatment of deviations;Patient education;Postural alighnment/biomechanic training during gait;Stair/curb/obstacle navigation training;Weight shift and postural control activities during gait        Total TIMED Treatment (min)   15     Neuromuscular re-education [phone])   Anticipatory postural adjustment training;Balance activities to improve control of center of gravity over base of support;Coordination activities;Dynamic balance training;Patient education;Postural control activities in relation to gravity and surgaces;Static balance training;Trunk alighment/stability  activities        Total TIMED Treatment (min)   10     Therapeutic Activities (93570)   Assistance/facilitation of bed mobility;Manual dexterity task training;Patient education        Total TIMED Treatment (min)   10     Therapeutic exercise  (97110)   Balance reaches - lower extremity;Strengthening exercises        Total TIMED Treatment (min)   5     Row Name 09/09/19 1300          Treatment Time     Treatment start time  1030     Total TIMED Treatment  (min)  45     Total Treatment Time (min)  40         Post Acute Discharge Recommendations  Discharge Rehabilitation Reccomendations ([place] ONLY): If medically appropriate and available, patient demonstrates tolerance to participate in skilled therapy at the following anticipated level  Therapy level: Home health  Equipment recommendations: No equipment needed - patient has own equipment    The physical therapist of record is endorsed by evaluating physical therapist.

## 2019-09-09 NOTE — Progress Notes (Signed)
Orthopaedic Spine Surgery Progress Note for Stephen [name]  Current Length of Stay:   3 days - Admitted on: 09/06/2019    DOS 09-06-19  Stage 1:   XLIF L3-4, L4-5  Stage 2:  PISF L3-5 with laminectomy     Subjective:  Back pain better controlled. Drain output downtrending, last shift 60cc. hgb 7.5 yesterday, denies SOB/CP/lightheadedness. No new weakness/paresthesias. Ambulating well with PT.    Objective:  Vital Signs:  BP 133/62 (BP Location: Left arm, BP Patient Position: Semi-Fowlers)   Pulse 58   Temp 98 F (36.7 C)   Resp 18   Ht 5\' 7"  (1.702 m)   Wt 94.3 kg (208 lb)   SpO2 97%   BMI 32.58 kg/m     Physical Exam:  General: patient awake, alert, and responding to commands; no apparent distress  Cardio: regular rate and rhythm per pulse  Respiratory: patient breathing quietly without use of accessory muscles  Neck/Back: incisional dressing c/d/i, drain dressing with SS strike through    Bilateral Lower Extremity   Motor:       Right           Left        Iliopsoas (L2/3)                     5/5           5/5         Quadriceps (L4)                    5/5           5/5        Tibialis Anterior (L4/5)           5/5           5/5        EHL (L5)                               5/5           5/5        GSC (S1)                              5/5           5/5        FHL (S2)                               5/5           5/5   Sensation:        SILT in L2-S15 distributions.    Laboratory Data:   Lab Results   Component Value Date    WBC 15.0 (H) 09/08/2019    HGB 7.5 (L) 09/08/2019    HCT 22.5 (L) 09/08/2019    PLT 120 (L) 09/08/2019     Input/Output:      Intake/Output Summary (Last 24 hours) at 09/09/2019 0831  Last data filed at 09/09/2019 0538  Gross per 24 hour   Intake 520 ml   Output 2390 ml   Net -1870 ml       Current Medications:  Current Facility-Administered Medications   Medication   . acetaminophen (TYLENOL) tablet 650 mg    Or   . acetaminophen (TYLENOL)  solution 650 mg    Or   . acetaminophen  (TYLENOL) suppository 650 mg   . acetaminophen (TYLENOL) tablet 975 mg   . amLODIPINE (NORVASC) tablet 5 mg   . ascorbic acid (VITAMIN C) tablet 500 mg   . atenolol (TENORMIN) tablet 25 mg   . bisacodyl (DULCOLAX) suppository 10 mg   . ceFAZolin (ANCEF) 2,000 mg in sodium chloride 0.9 % 100 mL IVPB   . cetirizine (ZYRTEC) tablet 10 mg   . docusate sodium (COLACE) capsule 250 mg   . famotidine (PEPCID) IVPB 20 mg    Or   . famotidine (PEPCID) tablet 20 mg   . gabapentin (NEURONTIN) capsule 300 mg   . magnesium hydroxide (MILK OF MAGNESIA) suspension 30 mL   . menthol (CEPACOL) lozenge 3 mg   . multivitamin tablet 1 tablet   . ondansetron (ZOFRAN) injection 4 mg   . oxyCODONE (ROXICODONE) tablet 10 mg   . oxyCODONE (ROXICODONE) tablet 5 mg   . polyethylene glycol (MIRALAX) packet 17 g   . psyllium (METAMUCIL) 58.12 % packet 1 packet   . senna (SENOKOT) tablet 17.2 mg   . sodium chloride 0.9% infusion   . tiZANidine (ZANAFLEX) tablet 2 mg     Assessment:   68 year old male s/p XLIF L3-4, L4-5, PISF L3-5 (9/18; Dr. Earnest Conroy). Patient doing well and progressing appropriately. XR demonstrates good position of fixation and alignment. Cleared by PT, pain controlled on PO. Drain output downtrending, anticipate dc drain later today    Plan:  - Immobilization: LSO for mobilization   - Antibiotics: continue while drain in place  - DVT Prophylaxis: no pharmacologic prophylaxis; SCDs/TED hose, ambulation  - Pain Medication: multimodal   - Drain: downtrending, possibly dc later today.   - Other: aggressive incentive spirometry  - Dispo: pending drain, CM clearance    Frederic Jericho, MD  Orthopaedic Spine Fellow    Please page the Orthopaedic Spine Surgery Team with questions or concerns based on patient location:     Established spine floor patients at [place]: Ortho Spine 1 - [phone]      Established spine floor patients at [facility]: Ortho Spine 2 - [phone]      New spine consultations not yet followed by spine team:     View [facility] on call for "SPINE CONSULT" call schedule (Orthopaedic Spine versus Neurosurgery)   If Orthopaedic Spine is on call please page the [facility] on call:   [place]: ORTHOPEDICS/TH   [place]: ORTHOPEDICS/HC

## 2019-09-09 NOTE — Interdisciplinary (Signed)
Procedure: [place]  [phone]) w/ Anterior Thoracic Extension [phone])  applied      Location: Spine  Instruction/Education Provided: yes    Ordering Physician:  Zlomislic, Vinko MD    [place] Product: No     Brace was issued over the weekend by [facility] team and was fit by PT/OT.

## 2019-09-09 NOTE — Op Note (Signed)
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 After anesthetic induction, sterile electrodes were placed for purposes of multimodal stimulation and recording.  Per the neurologist, baseline finding under anesthesia & intraoperative course by modality were reported to the surgeon  and anesthesiologist and a verbal acknowledgement received (details below):  -  Neuromuscular Junction (NMJ) testing: NMJ testing was explored at the [place] at baseline.  The reading neurologist provided comment at their discretion and as appropriate, and baseline NMJ responses were  communicated to the surgeon as ADEQUATE for motor element monitoring.  -  Electromyography (EMG): Recording electrodes were placed bilaterally in the following muscles to record free running  audible and visual electromyography (EMG):  Lower Extremity: PSOAS VASTUS LATERALIS TIBIALIS ANTERIOR ABDUCTOR HALLUCIS  ABDUCTOR DIGITI MINIMI PERONEUS LONGUS  The reading neurologist provided comment at their discretion and as appropriate, and baseline EMG responses were  communicated to the surgeon as ADEQUATE THROUGHOUT. See post-procedural notes for subsequent  procedural details.  -  Motor  Evoked Potentials (MEP): Bite block placement was CONFIRMED. Stimulating electrodes were placed in the  scalp over the primary motor cortex to generate baseline trans-cranial electric MEPs with a centrally facilitated double  train of 2 and 7, an inter-train interval of 20uS, a pulse width of 75uS, and stimulation of 200V on the left & 200V on the  right. An attempt was made to record compound muscle action potentials bilaterally from the musculature noted below:  Upper Extremities: THENAR HYPOTHENAR  Lower Extremity: PSOAS VASTUS LATERALIS TIBIALIS ANTERIOR ABDUCTOR HALLUCIS  ABDUCTOR DIGITI MINIMI PERONEUS LONGUS  A reliable, reproducible MEP is not always generated from every muscle at baseline in patients due to multiple variables  (individual response to anesthesia, proximal musculature, underlying pathology, etc).  The reading neurologist provided comment at their discretion and as appropriate, and baseline MEP responses were  communicated to the surgeon as ADEQUATE THROUGHOUT. See post-procedural notes for subsequent  procedural details.  -  Somatosensory Evoked Potentials (SEP):  Upper Extremity SEPs: Bilateral ULNAR nerves were stimulated to generate a cortical SEP. Baseline stimulation  parameters included a repetition rate of 2.38 Hz, pulse duration of 410mS, with current intensities of 41mA on the left and  33mA on the right. First cortical negativity (N20) response latency was 22.70mS on the left & 23.81mS on the right, with  amplitudes of .51uV on the left & .7uV on the right.  Lower Extremity SEPs: Bilateral SAPHENOUS nerves were stimulated to generate a cortical SEP. Baseline stimulation  parameters included a repetition rate of 2.38 Hz, pulse duration of 787mS, with current intensities of 85mA on the left and  47mA on the right. First cortical positivity (P37) response latency was 32.38mS on the left & 52mS on the right, with  amplitudes of .3uV on the left &.23uV on  the right.  The reading neurologist  provided comment at their discretion and as appropriate, and baseline SEP responses were  communicated to the surgeon as ADEQUATE THROUGHOUT. See post-procedural notes for subsequent  procedural details.  Electroencephalography (EEG): Scalp EEG was recorded from EIGHT channels covering the head. At baseline, per  neurologist, the overall voltage was NORMAL, the tracing was bilaterally SYMMETRICAL, and the frequency spectrum  varied between 4HZ  and 12HZ . Anesthesia was given information on presence of burst-suppression (B/S) in order to  assist in their determination of anesthetic depth; at baseline this was RELATIVE B/S.  The reading neurologist provided comment at their discretion and as appropriate, and this was communicated to  anesthesia/surgeons as appropriate. See post-procedural notes for subsequent details.  -  PROCEDURAL COURSE AND NOTES:  Per the neurologist, the surgeon and/or anesthesiologist was informed of the following post procedural notes and  acknowledged.  -  ANESTHETIC FADE: Anesthetic fade was WAS NOT OBSERVED in this case.  -  NMJ: NMJ demonstrated ADEQUATE motor responsiveness throughout the case.  -  EMG: NO SIGNIFICANT EMG ACTIVITY  -  MEP: NO ADVERSE EVENTS WERE OBSERVED  -  SEP: NO ADVERSE EVENTS WERE OBSERVED  -  EEG: NO ADVERSE EVENTS WERE OBSERVED Anesthesia staff was given data on B/S at multiple points during  the procedure, as requested, in order to assist in their determination of anesthetic depth (the primary purpose for EEG in  this procedure).  -  Please see Professional Report below for neurologist interpretation.  -  PROFESSIONAL INTERPRETATION: (Dr. Barron Alvine MD)    Baseline Waveforms: (evaluated under conditions of general anesthesia unless otherwise noted):  NMJ Testing: Within normal limits/unremarkable.  Free Run EMG: Within normal limits/unremarkable.  MEP: Within normal limits/unremarkable in musculature wherein reliable baseline signals generated.  SEP: Within  normal limits/unremarkable.  EEG: Within normal limits/unremarkable. Anesthetic depth was evaluated with EEG and discussed with anesthesia as appropriate, this was the primary purpose for EEG testing and considered essential to providing assistance to anesthesia in order to obtain optimal cortically-mediated evoked responses.     Post Procedural Notes:  NMJ Testing: The waveforms were substantively unchanged from baseline. No adverse electrodiagnostic events were encountered during NMJ testing/monitoring.  Free Run EMG: The waveforms were substantively unchanged from baseline. No adverse electrodiagnostic events were encountered during monitoring.  MEP: The waveforms were substantively unchanged from baseline. No adverse electrodiagnostic events were encountered during monitoring.  SEP: The waveforms were substantively unchanged from baseline. No adverse electrodiagnostic events were encountered during monitoring.  EEG: EEG varied with the anesthetic regimen and multiple variables as above; anesthesia was notified as data requested to assist in their determination of anesthetic depth. No adverse electrodiagnostic events were encountered during monitoring.    IMPRESSION: This was a successful multimodal intra-operative neurophysiological monitoring study. There was no substantive evidence of intra-operative impairment of neural structures based upon monitoring in the modalities above, & further clinical correlation is recommended.    Perioperative remote real-time monitoring dedicated personally to this patient alone performed for 1 hour(s) & 38 minute(s).    In-room monitoring dedicated personally to this patient alone performed for  23  minute(s).        Barron Alvine MD    Associate Professor of Neurosciences  Director, Interventional Neurophysiology Service  [place] [facility]

## 2019-09-10 MED ORDER — TIZANIDINE HCL 4 MG OR TABS
2.0000 mg | ORAL_TABLET | Freq: Four times a day (QID) | ORAL | Status: DC
Start: 2019-09-10 — End: 2019-09-12
  Administered 2019-09-10 – 2019-09-12 (×9): 2 mg via ORAL
  Filled 2019-09-10 (×8): qty 1

## 2019-09-10 MED ORDER — TIZANIDINE HCL 4 MG OR TABS
2.0000 mg | ORAL_TABLET | Freq: Three times a day (TID) | ORAL | Status: DC | PRN
Start: 2019-09-10 — End: 2019-09-12

## 2019-09-10 MED ORDER — TIZANIDINE HCL 4 MG OR TABS
4.0000 mg | ORAL_TABLET | Freq: Three times a day (TID) | ORAL | Status: DC | PRN
Start: 2019-09-10 — End: 2019-09-10

## 2019-09-10 MED ORDER — LIDOCAINE 4 % EX PTCH
1.0000 | MEDICATED_PATCH | CUTANEOUS | Status: DC
Start: 2019-09-10 — End: 2019-09-12
  Administered 2019-09-10 – 2019-09-12 (×3): 1 via TRANSDERMAL
  Filled 2019-09-10 (×3): qty 1

## 2019-09-10 NOTE — Discharge Summary (Signed)
Patient Name:  Stephen [name]    Principal Diagnosis (required):  Severe lumbar stenosis     Hospital Problem List (required):    Patient Active Problem List   Diagnosis   . Post-traumatic stricture of anterior urethra   . Bladder neck contracture   . Lumbar radiculopathy   . Cervical radiculopathy   . Incomplete bladder emptying   . Lumbosacral spondylosis without myelopathy   . Chronic SI joint pain   . Degenerative spondylolisthesis   . Lumbar stenosis with neurogenic claudication   . Pre-procedure lab exam   . Cervical spondylosis without myelopathy   . Cervical spondylosis   . S/P lumbar fusion       Additional Hospital Diagnoses ("rule out" or "suspected" diagnoses, etc.):  None         Principal Procedure During This Hospitalization (required):  Procedure(s):  FUSION, SPINE, LUMBAR, extreme lateral interbody fusion, 2 LEVELS, L3 through L5  LAMINECTOMY, SPINE, LUMBAR, 2 LEVELS, WITH FUSION USING INSTRUMENTATION, POSTERIOR APPROACH L3 through4 L5      Other Procedures Performed During This Hospitalization (required):  Foley placement in OR by Urology    Consultations Obtained During This Hospitalization:  PT  OT  Pain   CM  Neurology  Urology    Key consultant recommendations:  Therapy regimen  Discharge disposition      Reason for Admission to the Hospital / History of Present Illness:  Per H&P note: [name] - 08/23/19  "This is a 68 year old male here for preoperative exam prior to XLIF L3 thru L5 with laminectomy and PSIF L3 thru L5 with Dr. Lelon Huh on [id number]. "    Hospital Course by Problem (required):  The patient was admitted and taken to the operating room for the planned procedure.  The procedure proceeded uneventfully and without complication - please see operative details for report.  Post-operatively the patient was admitted to the ward for recovery.  The post-operative plan  included the following:  Mechanical DVT prophylaxis  IV and PO pain medication  Prophylactic  antibiotics  Working with PT/OT    Urology - 09/06/19  "Assessment and Plan:  Stephen [name] is a 30 year old here for back surgery, now with difficult foley placement. 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 Lucia Bitter.    The patient was asleep on the OR table.     Nursing attempted a 35F foley and was able to get urine return but was not able to fully advance it into the bladder. I utilized a [id number] silicone foley and was able to place it into the bladder without any issues. Clear yellow urine.    - foley per primary team. Would keep foley in place until mobilizing at bedside and not dependent on nacrotics.    If the foley needs to be replaced, it may be replaced by nursing. They should use a [id number] or [id number] silicone foley catheter with plenty of lube."      On the day of discharge the patient was medically stable, urinating without difficulty, tolerating oral diet, pain well controlled, and was cleared for discharge by PT/OT. The patient was given strict return precautions, wound care instructions, and activity restrictions and demonstrated clear understanding.      Required 2 additional nights for pain control.     Patient will follow-up in  2 weeks in clinic.    Tests Outstanding at Discharge Requiring Follow Up:  None    Discharge Condition (required):  Stable    Key Physical Exam Findings at Discharge:  Exam preformed by Nanine Means 9/21  General: patient awake, alert, and responding to commands; no apparent distress  Cardio: regular rate and rhythm per pulse  Respiratory: patient breathing quietly without use of accessory muscles  Neck/Back: incisional dressing c/d/i, drain dressing with SS strike through    Bilateral Lower Extremity              Motor:                                         Right           Left                   Iliopsoas (L2/3)                      5/5              5/5                            Quadriceps (L4)                    5/5              5/5                   Tibialis Anterior (L4/5)           5/5             5/5                   EHL (L5)                               5/5              5/5                   GSC (S1)                              5/5              5/5                   FHL (S2)                               5/5              5/5              Sensation:                   SILT in L2-S15 distributions.    Discharge Diet:  rgeular    Discharge Medications:     What To Do With Your Medications      START taking these medications      Add'l Info   acetaminophen 325 MG tablet  Commonly known as: TYLENOL  Take 2 tablets (  650 mg) by mouth every 6 hours as needed for Mild Pain (Pain Score 1-3).   Quantity: 90 tablet  Refills: 0     docusate sodium 250 MG capsule  Commonly known as: COLACE  Take 1 capsule (250 mg) by mouth 2 times daily as needed for Constipation.   Quantity: 60 capsule  Refills: 0     gabapentin 300 MG capsule  Commonly known as: NEURONTIN  Take 1 capsule (300 mg) by mouth 3 times daily.   Quantity: 90 capsule  Refills: 0     oxyCODONE 10 MG tablet  Commonly known as: ROXICODONE  Take 1 tablet (10 mg) by mouth every 4 hours as needed for Severe Pain (Pain Score 7-10).   Quantity: 50 tablet  Refills: 0     senna 8.6 MG tablet  Commonly known as: SENOKOT  Take 2 tablets (17.2 mg) by mouth every morning as needed for Constipation.   Quantity: 30 tablet  Refills: 0     tizanidine 2 MG tablet  Commonly known as: ZANAFLEX  Take 1 tablet (2 mg) by mouth every 8 hours as needed for muscle spasms   Quantity: 40 tablet  Refills: 0        CONTINUE taking these medications      Add'l Info   [place] 0.025 % ophthalmic solution  INSTILL 1 DROP INTO BOTH EYES TWICE A DAY AS DIRECTED  Generic drug: ketotifen   Refills: 0     allopurinol 300 MG tablet  Commonly known as: ZYLOPRIM  Take 300 mg by mouth daily.   Refills: 0      atenolol 25 MG tablet  Commonly known as: TENORMIN  Take 25 mg by mouth daily.   Refills: 0     ciclopirox 0.77 % Gel  Commonly known as: LOPROX  Apply topically 2 times daily.   Refills: 0     LAMISIL PO  Take by mouth daily.   Refills: 0     Norvasc 5 MG tablet  Take 10 mg by mouth daily.  Generic drug: amLODIPINE   Refills: 0     omeprazole 20 MG capsule  Commonly known as: PRILOSEC  Take 20 mg by mouth daily.   Refills: 0     SYSTANE ULTRA OP   Refills: 0     triamcinolone 0.1 % cream  Commonly known as: KENALOG  Apply 1 Application topically 2 times daily. Apply a thin layer as directed   Refills: 0     VITAMIN D PO  Take 500 mg by mouth daily.   Refills: 0     ZYRTEC PO  10 mg.   Refills: 0        STOP taking these medications    mupirocin 2 % ointment  Commonly known as: BACTROBAN           Where to Get Your Medications      These medications were sent to [facility] [id number], [place] [address]    Hours: Mon-Fri: 8:30am-7:00pm; Sat-Sun & Holidays: 9:00am-5:00pm Phone: [phone]    acetaminophen 325 MG tablet   docusate sodium 250 MG capsule   gabapentin 300 MG capsule   oxyCODONE 10 MG tablet   senna 8.6 MG tablet   tizanidine 2 MG tablet         Allergies:  Amoxicillin and Sulfa drugs    Discharge Disposition:  Home.    Discharge Code Status:  Full code / full  care  This code status is not changed from the time of admission.    Follow Up Appointments:    Scheduled appointments:  Future Appointments   Date Time Provider [place]   [id number]  [id number] PM Zlomislic, Illa Level, MD [facility]       For appointments requested for after discharge that have not yet been scheduled, refer to the [facility] Discharge Referrals section of the After Visit Summary.    Discharging 31 Contact Information:  [facility] operator at [phone].

## 2019-09-10 NOTE — Interdisciplinary (Signed)
Physical Therapy Discharge Summary    Admitting Physician:  Zlomislic, Vinko, MD  Admission Date 09/06/2019    Inpatient Diagnosis:   Problem List       Codes    Pain     ICD-10-CM: R52  ICD-9-CM: 780.96    Relevant Orders    X-Ray Fluoroscopy Up To 1 Hr - OR (Completed)    Decreased functional mobility     ICD-10-CM: R26.89  ICD-9-CM: 781.99    Decreased activities of daily living (ADL)     ICD-10-CM: Z78.9  ICD-9-CM: V49.89    Spinal stenosis of lumbar region, unspecified whether neurogenic claudication present     ICD-10-CM: M48.061  ICD-9-CM: 724.02    Cauda equina compression (CMS-HCC)     ICD-10-CM: G83.4  ICD-9-CM: 344.60    Nerve root compression     ICD-10-CM: G54.9  ICD-9-CM: 729.2          IP Start of Service   Start of Care: 09/07/19  Onset Date: 09/06/2019  Reason for referral: Decline in functional ability/mobility    Preferred Language:English         Past Medical History:   Diagnosis Date   . Chronic suprapubic catheter (CMS-HCC)    . Congenital hydronephrosis    . Gout    . Headache    . Hematuria    . Kidney disease    . Kidney stones    . Major depressive disorder, single episode    . Polyarthropathy or polyarthritis of multiple sites    . Retinal detachment    . Urethral stricture       Past Surgical History:   Procedure Laterality Date   . CT INSERTION OF SUPRAPUBIC CATH  09/25/2015   . NEPHRECTOMY Right 1995   . APPENDECTOMY     . COLONOSCOPY     . CYSTOSCOPY     . CYSTOSCOPY W/ LASER LITHOTRIPSY     . OTHER SURGICAL HISTORY      Interstim 01/29/2011   . TRANSURETHRAL RESECTION OF PROSTATE         PT Acute     Row Name 09/10/19 0700          Type of Visit    Type of Physical Therapy note  Physical Therapy Discharge Summary               Eval cont.     [place] Name 09/10/19 0700          [place] AM-PAC: Basic Mobility    Assistance Needed to Turn from Back to Side While in a Flat Bed Without Using Bedrails  4 - None (independent)     Difficulty with Supine to Sit Transfer  4 - None (independent)     How Much Help Needed to Move to/from Bed to Chair  4 - None (independent)     Difficulty with Sit to Stand Transfer from Chair with Arms  4 - None (independent)     How Much Help Needed to Walk in Room  4 - None (independent)     How Much Help Needed to Climb 3-5 Steps with a Rail  4 - None (independent)     AMPAC Total Score  24     Assessment: AM-PAC Basic Mobility Impairment Rating  Score 24 - 0% impaired     Row Name 09/10/19 0700          Assessment    Assessment  At this time, patient has met all goals set at  initial evaluation and does not demonstrate need for continued skilled PT during hospitalization. Anticipate safe discharge home with HHPT and family supervision when medically stable.      [place] Name 09/10/19 0700          Goal 1 (Short Term)    Impairment  Gait impairment     Custom goal  Patient will be Mod I to ambulate household distances with [facility] to promote safe ambulation     Goal Status  Met     Row Name 09/10/19 0700          Goal 2 (Short Term)    Impairment  Functional mobility limitation     Custom goal  Patient will demonstrate supervision to ascend/descend 2 stairs with SPC to safely enter/exit home     Goal Status  Met     Row Name 09/10/19 0700          Goal 3 (Short Term)    Impairment  Functional mobility limitation     Custom goal  Patient will be ind for bed mobility via logroll to promote safe transfers OOB     Goal Status  Met     Row Name 09/10/19 0700          Goal 4 (Short Term)    Impairment  Education need     Custom goal  Patient will be ind to don/doff LSO to promote safe recovery     Goal Status  Met     Row Name 09/10/19 0700          Treatment Plan    Frequency of treatment  Patient appropriate for discharge from therapy     Duration of treatment (number of visits)  Other (comment)     Status of treatment  Patient appropriate for discharge from therapy     [place] Name 09/10/19 0700          Discharge Report    Discharge Report Date  09/10/19     Reason for discharge  Goals met,  no further skilled treatment indicated     Patient participation  Excellent, patient participated in all treatment sessions     Patient compliance with therapy program  Excellent     Response to therapy  Excellent              The physical therapist of record is endorsed by evaluating physical therapist.

## 2019-09-10 NOTE — Interdisciplinary (Signed)
Anticipated discharge today pending PT clearance, pain control and final MD order.  Final discharge recommendations and arrangement discussed and agreed upon by patient .  No further DC planning needs. RN informed.      09/10/19 1013   Discharge Plan   Living Arrangements * Family Member  (Lives at sister's home but will be alone)   Patient/Family/Other Engaged in Discharge Planning * Yes   Name, Relationship and Phone Number of Person Engaged in the Discharge Plan Patient   Family/Caregiver's Assessed for * Readiness, willingness, and ability to provide or support self-management activities   Respite Care * Not Applicable   Patient/Family/Other Are In Agreement With Discharge Plan * Yes   Patient Has Decision Making Capacity * Yes   Verified phone number for DC location * Yes   Verified address for DC location * Yes   No DC address or phone # available for the patient * NA   Patient/Family/Legal/Surrogate Decision Maker Has Been Given a List Options And Choice In The Selection of Post-Acute Care Providers * Yes   CM discussed the following with pt, and/or family, and/or DPOA [place] has agreements with select post-acute care providers in the collaborative care network   Patient's Top 3 Discharge [place] Clearance Needed * No   If Medicare or Medicare Managed Care: Important Message from Medicare Given   If Medicare or Medicare Managed Care: Important Message from Medicare Given No   MOON   MOON Provided to Patient Not Applicable   Discharge Planning Needs   Actions Needed for Discharge Final DC Order - Post-acute care arranged   Planned living arrangements after discharge: Home   Does this patient have CM discharge planning needs? Yes   CM Needs Met? Yes   Discharge Transportation   Transportation * Family/Friend   Final Discharge Destination/Services   Final Discharge Destination/Services * DME;Home Health;Home   Home Health/Home [place]  ([place] and nurse visit.  Provider will call prior to initial visit 1-2 days after discharge)   Phone * [phone]   DME   [place] (DME) * Has a cane       [place]

## 2019-09-10 NOTE — Plan of Care (Signed)
Problem: Promotion of Health and Safety  Goal: Promotion of Health and Safety  Description: The patient remains safe, receives appropriate treatment and achieves optimal outcomes (physically, psychosocially, and spiritually) within the limitations of the disease process by discharge.    Information below is the current care plan.  Outcome: Progressing  Flowsheets  Taken 09/10/2019 1816  Individualized Interventions/Recommendations #1: Assess pain. Medicated with oxy prn  Individualized Interventions/Recommendations #2 (if applicable): Encouraged pt to call for assistance before getting out of bed  Individualized Interventions/Recommendations #3 (if applicable): Monitor back incision. Dressing changed this morning by MD  Outcome Evaluation (rationale for progressing/not progressing) every shift: Pt A/Ox 4, VSS, continue reporting back pain. medicated with oxy and zanaflex. No distress noted. Will continue to monitor.  Taken 09/10/2019 1046  Patient /Family stated Goal: less pain

## 2019-09-10 NOTE — Progress Notes (Signed)
Orthopaedic Spine Surgery Progress Note for Stephen [name]  Current Length of Stay:   4 days - Admitted on: 09/06/2019    DOS 09-06-19  Stage 1:   XLIF L3-4, L4-5  Stage 2:  PISF L3-5 with laminectomy     Subjective:  Back pain worse this AM. Focal to surgical site, non radiating. Denies SOB/CP/lightheadedness. No new weakness/paresthesias. Previously ambulating well with PT    Objective:  Vital Signs:  BP 131/58 (BP Location: Left arm, BP Patient Position: Semi-Fowlers)   Pulse 88   Temp 98 F (36.7 C)   Resp 18   Ht 5\' 7"  (1.702 m)   Wt 94.3 kg (208 lb)   SpO2 96%   BMI 32.58 kg/m     Physical Exam:  General: patient awake, alert, and responding to commands; no apparent distress  Cardio: regular rate and rhythm per pulse  Respiratory: patient breathing quietly without use of accessory muscles  Back: wound cdi, drain removed.    Bilateral Lower Extremity   Motor:       Right           Left        Iliopsoas (L2/3)                     4/5           5/5         Quadriceps (L4)                    5/5           5/5        Tibialis Anterior (L4/5)           5/5           5/5        EHL (L5)                               5/5           5/5        GSC (S1)                              5/5           5/5        FHL (S2)                               5/5           5/5   Sensation:        SILT in L2-S15 distributions.    Laboratory Data:   Lab Results   Component Value Date    WBC 15.0 (H) 09/08/2019    HGB 7.5 (L) 09/08/2019    HCT 22.5 (L) 09/08/2019    PLT 120 (L) 09/08/2019     Input/Output:      Intake/Output Summary (Last 24 hours) at 09/10/2019 1232  Last data filed at 09/10/2019 0615  Gross per 24 hour   Intake 700 ml   Output 20 ml   Net 680 ml       Current Medications:  Current Facility-Administered Medications   Medication   . acetaminophen (TYLENOL) tablet 650 mg    Or   . acetaminophen (TYLENOL) solution 650 mg  Or   . acetaminophen (TYLENOL) suppository 650 mg   . acetaminophen (TYLENOL) tablet 975 mg     . amLODIPINE (NORVASC) tablet 5 mg   . ascorbic acid (VITAMIN C) tablet 500 mg   . atenolol (TENORMIN) tablet 25 mg   . bisacodyl (DULCOLAX) suppository 10 mg   . ceFAZolin (ANCEF) 2,000 mg in sodium chloride 0.9 % 100 mL IVPB   . cetirizine (ZYRTEC) tablet 10 mg   . docusate sodium (COLACE) capsule 250 mg   . famotidine (PEPCID) IVPB 20 mg    Or   . famotidine (PEPCID) tablet 20 mg   . gabapentin (NEURONTIN) capsule 300 mg   . lidocaine (ASPERCREME) 4 % patch 1 patch   . magnesium hydroxide (MILK OF MAGNESIA) suspension 30 mL   . menthol (CEPACOL) lozenge 3 mg   . multivitamin tablet 1 tablet   . ondansetron (ZOFRAN) injection 4 mg   . oxyCODONE (ROXICODONE) tablet 10 mg   . oxyCODONE (ROXICODONE) tablet 5 mg   . oxyCODONE (ROXICODONE) tablet 5 mg   . polyethylene glycol (MIRALAX) packet 17 g   . psyllium (METAMUCIL) 58.12 % packet 1 packet   . senna (SENOKOT) tablet 17.2 mg   . sodium chloride 0.9% infusion   . tiZANidine (ZANAFLEX) tablet 2 mg   . tiZANidine (ZANAFLEX) tablet 2 mg     Assessment:   68 year old male s/p above procedure. Worsened pain issues this AM. Focal to surgical site. Will add lidocaine patch, increase tizandine frequency, consider increasing oxycodone, and reassess. Otherwise patient doing well and progressing appropriately, cleared by PT    Plan:  - Immobilization: LSO for mobilization   - DVT Prophylaxis: no pharmacologic prophylaxis; SCDs/TED hose, ambulation  - Pain Medication: multimodal   - Drain: removed  - Other: aggressive incentive spirometry  - Dispo: pending pain control, CM clearance    Frederic Jericho, MD  Orthopaedic Spine Fellow    Please page the Orthopaedic Spine Surgery Team with questions or concerns based on patient location:     Established spine floor patients at [place]: Ortho Spine 1 - [phone]      Established spine floor patients at [facility]: Ortho Spine 2 - [phone]      New spine consultations not yet followed by spine team:    View [facility] on  call for "SPINE CONSULT" call schedule (Orthopaedic Spine versus Neurosurgery)   If Orthopaedic Spine is on call please page the [place] on call:   [place]: ORTHOPEDICS/TH   [place]: ORTHOPEDICS/HC

## 2019-09-11 ENCOUNTER — Other Ambulatory Visit: Payer: Self-pay

## 2019-09-11 MED ORDER — DOCUSATE SODIUM 250 MG OR CAPS
250.0000 mg | ORAL_CAPSULE | Freq: Two times a day (BID) | ORAL | 0 refills | Status: DC | PRN
Start: 2019-09-11 — End: 2020-05-06
  Filled 2019-09-11: qty 60, 30d supply, fill #0

## 2019-09-11 MED ORDER — TIZANIDINE HCL 2 MG OR TABS
2.0000 mg | ORAL_TABLET | Freq: Three times a day (TID) | ORAL | 0 refills | Status: DC | PRN
Start: 2019-09-11 — End: 2019-10-09
  Filled 2019-09-11: qty 40, 14d supply, fill #0

## 2019-09-11 MED ORDER — GABAPENTIN 300 MG OR CAPS
300.0000 mg | ORAL_CAPSULE | Freq: Three times a day (TID) | ORAL | 0 refills | Status: DC
Start: 2019-09-11 — End: 2019-11-06
  Filled 2019-09-11: qty 90, 30d supply, fill #0

## 2019-09-11 MED ORDER — OXYCODONE HCL 5 MG OR TABS
15.0000 mg | ORAL_TABLET | ORAL | Status: DC | PRN
Start: 2019-09-11 — End: 2019-09-12
  Administered 2019-09-11 – 2019-09-12 (×3): 15 mg via ORAL
  Filled 2019-09-11 (×3): qty 1

## 2019-09-11 MED ORDER — OXYCODONE HCL 10 MG OR TABS
10.0000 mg | ORAL_TABLET | ORAL | Status: DC | PRN
Start: 2019-09-11 — End: 2019-09-12
  Administered 2019-09-11: 10 mg via ORAL
  Filled 2019-09-11: qty 1

## 2019-09-11 MED ORDER — PNEUMOCOCCAL VAC POLYVALENT 25 MCG/0.5ML IJ INJ (CUSTOM)
0.5000 mL | INJECTION | INTRAMUSCULAR | Status: AC
Start: 2019-09-11 — End: 2019-09-11
  Administered 2019-09-11: 0.5 mL via INTRAMUSCULAR
  Filled 2019-09-11: qty 0.5

## 2019-09-11 MED ORDER — ACETAMINOPHEN 325 MG PO TABS
650.0000 mg | ORAL_TABLET | Freq: Four times a day (QID) | ORAL | 0 refills | Status: DC | PRN
Start: 2019-09-11 — End: 2019-10-22
  Filled 2019-09-11: qty 90, 12d supply, fill #0

## 2019-09-11 MED ORDER — OXYCODONE HCL 10 MG OR TABS
10.0000 mg | ORAL_TABLET | ORAL | 0 refills | Status: DC | PRN
Start: 2019-09-11 — End: 2019-09-23
  Filled 2019-09-11: qty 50, 9d supply, fill #0

## 2019-09-11 MED ORDER — SENNA 8.6 MG OR TABS
17.2000 mg | ORAL_TABLET | ORAL | 0 refills | Status: DC | PRN
Start: 2019-09-11 — End: 2020-07-29
  Filled 2019-09-11: qty 30, 30d supply, fill #0

## 2019-09-11 MED ORDER — INFLUENZA VAC 65+ YRS WRAPPED RECORD (~~LOC~~)
1.0000 | PREFILLED_SYRINGE | INTRAMUSCULAR | Status: AC
Start: 2019-09-11 — End: 2019-09-11
  Administered 2019-09-11: 17:00:00 1 via INTRAMUSCULAR
  Filled 2019-09-11: qty 1

## 2019-09-11 NOTE — Plan of Care (Signed)
Problem: Promotion of Health and Safety  Goal: Promotion of Health and Safety  Description: The patient remains safe, receives appropriate treatment and achieves optimal outcomes (physically, psychosocially, and spiritually) within the limitations of the disease process by discharge.    Information below is the current care plan.  Flowsheets  Taken 09/11/2019 1931 by Beau Fanny, RN  Guidelines: Inpatient Nursing Guidelines  Outcome Evaluation (rationale for progressing/not progressing) every shift: remained alert and oreinted,  pain is controlled with po pain medication, voiding via urinal, incision with old serous drainage intact, tolerating regular diet, ptient did not want to go home today possible discharge tomorrow, up with assistance with unsteady gait, refuses to have bed alarm and he is getting up unattended, no acute distress noted at this time.  Taken 09/11/2019 0931 by Beau Fanny, RN  Patient /Family stated Goal: pain control  Taken 09/10/2019 1816 by Hassell Done, RN  Individualized Interventions/Recommendations #1: Assess pain. Medicated with oxy prn  Individualized Interventions/Recommendations #2 (if applicable): Encouraged pt to call for assistance before getting out of bed  Individualized Interventions/Recommendations #3 (if applicable): Monitor back incision. Dressing changed this morning by MD

## 2019-09-11 NOTE — Progress Notes (Signed)
Orthopaedic Spine Surgery Progress Note for Stephen [name]  Current Length of Stay:   5 days - Admitted on: 09/06/2019    DOS 09-06-19  Stage 1:   XLIF L3-4, L4-5  Stage 2:  PISF L3-5 with laminectomy     Subjective:  Back pain improved today, focal to surgical site, non radiating. Denies SOB/CP/lightheadedness. No new weakness/paresthesias. Ambulating around room with cane this AM.    Objective:  Vital Signs:  BP 143/70   Pulse 59   Temp 98.5 F (36.9 C)   Resp 17   Ht 5\' 7"  (1.702 m)   Wt 94.3 kg (208 lb)   SpO2 98%   BMI 32.58 kg/m     Physical Exam:  General: patient awake, alert, and responding to commands; no apparent distress  Cardio: regular rate and rhythm per pulse  Respiratory: patient breathing quietly without use of accessory muscles  Back: wound cdi, minimal strike through over drain site. drain removed.    Bilateral Lower Extremity   Motor:       Right           Left        Iliopsoas (L2/3)                     4/5           5/5         Quadriceps (L4)                    5/5           5/5        Tibialis Anterior (L4/5)           5/5           5/5        EHL (L5)                               5/5           5/5        GSC (S1)                              5/5           5/5        FHL (S2)                               5/5           5/5   Sensation:        SILT in L2-S15 distributions.    Laboratory Data:   Lab Results   Component Value Date    WBC 15.0 (H) 09/08/2019    HGB 7.5 (L) 09/08/2019    HCT 22.5 (L) 09/08/2019    PLT 120 (L) 09/08/2019     Input/Output:      Intake/Output Summary (Last 24 hours) at 09/11/2019 1023  Last data filed at 09/10/2019 1411  Gross per 24 hour   Intake 240 ml   Output -   Net 240 ml       Current Medications:  Current Facility-Administered Medications   Medication   . acetaminophen (TYLENOL) tablet 650 mg    Or   . acetaminophen (TYLENOL) solution 650 mg    Or   .  acetaminophen (TYLENOL) suppository 650 mg   . acetaminophen (TYLENOL) tablet 975 mg   .  amLODIPINE (NORVASC) tablet 5 mg   . ascorbic acid (VITAMIN C) tablet 500 mg   . atenolol (TENORMIN) tablet 25 mg   . bisacodyl (DULCOLAX) suppository 10 mg   . cetirizine (ZYRTEC) tablet 10 mg   . docusate sodium (COLACE) capsule 250 mg   . famotidine (PEPCID) IVPB 20 mg    Or   . famotidine (PEPCID) tablet 20 mg   . gabapentin (NEURONTIN) capsule 300 mg   . lidocaine (ASPERCREME) 4 % patch 1 patch   . magnesium hydroxide (MILK OF MAGNESIA) suspension 30 mL   . menthol (CEPACOL) lozenge 3 mg   . multivitamin tablet 1 tablet   . ondansetron (ZOFRAN) injection 4 mg   . oxyCODONE (ROXICODONE) tablet 10 mg   . oxyCODONE (ROXICODONE) tablet 15 mg   . oxyCODONE (ROXICODONE) tablet 5 mg   . polyethylene glycol (MIRALAX) packet 17 g   . psyllium (METAMUCIL) 58.12 % packet 1 packet   . senna (SENOKOT) tablet 17.2 mg   . sodium chloride 0.9% infusion   . tiZANidine (ZANAFLEX) tablet 2 mg   . tiZANidine (ZANAFLEX) tablet 2 mg     Assessment:   68 year old male s/p above procedure.  Back pain improved but still bothersome. Increased oxycodone. Pt agreeable with pain regimen. Dc today    Plan:  - Immobilization: LSO for mobilization   - DVT Prophylaxis: no pharmacologic prophylaxis; SCDs/TED hose, ambulation  - Pain Medication: multimodal, increased oxycodone  - Drain: removed  - Other: aggressive incentive spirometry  - Dispo: dc today    Frederic Jericho, MD  Orthopaedic Spine Fellow    Please page the Orthopaedic Spine Surgery Team with questions or concerns based on patient location:     Established spine floor patients at [facility]: Ortho Spine 1 - [phone]      Established spine floor patients at [facility]: Ortho Spine 2 - [phone]      New spine consultations not yet followed by spine team:    View [facility] on call for "SPINE CONSULT" call schedule (Orthopaedic Spine versus Neurosurgery)   If Orthopaedic Spine is on call please page the [place] on call:   [place]: ORTHOPEDICS/TH   [place]:  ORTHOPEDICS/HC

## 2019-09-11 NOTE — Interdisciplinary (Signed)
09/11/19 0932   Discharge Planning Needs   Does this patient have CM discharge planning needs? Yes   CM Needs Met? Yes     DC was held yesterday due to pain.  DC needs met.    [place]

## 2019-09-12 ENCOUNTER — Encounter (HOSPITAL_COMMUNITY): Payer: Self-pay | Admitting: Orthopaedic Surgery of the Spine

## 2019-09-12 ENCOUNTER — Other Ambulatory Visit: Payer: Self-pay

## 2019-09-12 NOTE — Plan of Care (Signed)
Problem: Promotion of Health and Safety  Goal: Promotion of Health and Safety  Description: The patient remains safe, receives appropriate treatment and achieves optimal outcomes (physically, psychosocially, and spiritually) within the limitations of the disease process by discharge.    Information below is the current care plan.  Flowsheets  Taken 09/12/2019 1057 by Kandy Garrison, RN  Outcome Evaluation (rationale for progressing/not progressing) every shift: A/O oob with cane. Reeducated on importance of spine precuations and weraing brace. Pt impulsive and wants to go home. Oxy continued as needed. Dressing changed by team this am. Refused bp meds this am,, will f\u with PCP. Plan for DC today. Sister will pick up at noon.Goals ongoing.  Taken 09/12/2019 [id number] by Kandy Garrison, RN  Patient /Family stated Goal: pain control   Taken 09/12/2019 0504 by Herbie Drape., RN  Individualized Interventions/Recommendations #1: oob with stand by assit with the use of fww/ cane  Individualized Interventions/Recommendations #2 (if applicable): prn pain meds given  Individualized Interventions/Recommendations #4 (if applicable): assessed surgical incision on back for any s/sx of infection  Individualized Interventions/Recommendations #5 (if applicable): LSO brace on while OOB  Taken 09/11/2019 1931 by Beau Fanny, RN  Guidelines: Inpatient Nursing Guidelines

## 2019-09-12 NOTE — Plan of Care (Signed)
Problem: Promotion of Health and Safety  Goal: Promotion of Health and Safety  Description: The patient remains safe, receives appropriate treatment and achieves optimal outcomes (physically, psychosocially, and spiritually) within the limitations of the disease process by discharge.    Information below is the current care plan.  Outcome: Progressing  Flowsheets  Taken 09/12/2019 0504  Individualized Interventions/Recommendations #1: oob with stand by assit with the use of fww/ cane  Individualized Interventions/Recommendations #2 (if applicable): prn pain meds given  Individualized Interventions/Recommendations #3 (if applicable): vitals checked  Individualized Interventions/Recommendations #4 (if applicable): assessed surgical incision on back for any s/sx of infection  Individualized Interventions/Recommendations #5 (if applicable): LSO brace on while OOB  Taken 09/11/2019 2000  Patient /Family stated Goal: "pain and rest"  Note: VSS, pain well-managed with current regimen, remains afebrile, oob with fww and walked around the unit with ccp, gait is even, but unsteady at times. Dressing on back is dry and intact with old, scant amt of serous drainage, LSO brace on while oob. Will continue to monitor, call-light within reach, pt refuses bed alarm and explained to pt about the risk,pt verbalizes and understands.

## 2019-09-12 NOTE — Plan of Care (Signed)
Patient Discharge/Education  AVS printed/explained to pt: Yes  Learner: patient  Language: English  Use of Interpreter: N/A  D/c medication or prescription: delivered at bedside  Method: Explanation and Handout  Treatment education given: Yes, dressing changed and extra dressing given to the patient  Response: Verbalizes and demonstrate understanding  Stable upon discharge: All belongings with the patient.

## 2019-09-12 NOTE — Progress Notes (Signed)
Orthopaedic Spine Surgery Progress Note for Stephen [name]  Current Length of Stay:   6 days - Admitted on: 09/06/2019    DOS 09-06-19  Stage 1:   XLIF L3-4, L4-5  Stage 2:  PISF L3-5 with laminectomy     Subjective:  Back pain much improved today. Pt is ambulating around his room. Denies SOB/CP/lightheadedness. No new weakness/paresthesias. Feels ready to go home, no complaints.    Objective:  Vital Signs:  BP 118/51   Pulse 72   Temp 98.3 F (36.8 C)   Resp 16   Ht 5\' 7"  (1.702 m)   Wt 94.3 kg (208 lb)   SpO2 98%   BMI 32.58 kg/m     Physical Exam:  General: patient awake, alert, and responding to commands; no apparent distress  Cardio: regular rate and rhythm per pulse  Respiratory: patient breathing quietly without use of accessory muscles  Back: dsg cdi    Bilateral Lower Extremity   Motor:       Right           Left        Iliopsoas (L2/3)                     4/5           5/5         Quadriceps (L4)                    5/5           5/5        Tibialis Anterior (L4/5)           5/5           5/5        EHL (L5)                               5/5           5/5        GSC (S1)                              5/5           5/5        FHL (S2)                               5/5           5/5   Sensation:        SILT in L2-S15 distributions.    Laboratory Data:   Lab Results   Component Value Date    WBC 15.0 (H) 09/08/2019    HGB 7.5 (L) 09/08/2019    HCT 22.5 (L) 09/08/2019    PLT 120 (L) 09/08/2019     Input/Output:      Intake/Output Summary (Last 24 hours) at 09/12/2019 0956  Last data filed at 09/11/2019 2000  Gross per 24 hour   Intake 770 ml   Output 500 ml   Net 270 ml       Current Medications:  Current Facility-Administered Medications   Medication   . acetaminophen (TYLENOL) tablet 650 mg    Or   . acetaminophen (TYLENOL) solution 650 mg    Or   . acetaminophen (TYLENOL) suppository 650  mg   . acetaminophen (TYLENOL) tablet 975 mg   . amLODIPINE (NORVASC) tablet 5 mg   . ascorbic acid (VITAMIN C)  tablet 500 mg   . atenolol (TENORMIN) tablet 25 mg   . bisacodyl (DULCOLAX) suppository 10 mg   . cetirizine (ZYRTEC) tablet 10 mg   . docusate sodium (COLACE) capsule 250 mg   . famotidine (PEPCID) IVPB 20 mg    Or   . famotidine (PEPCID) tablet 20 mg   . gabapentin (NEURONTIN) capsule 300 mg   . lidocaine (ASPERCREME) 4 % patch 1 patch   . magnesium hydroxide (MILK OF MAGNESIA) suspension 30 mL   . menthol (CEPACOL) lozenge 3 mg   . multivitamin tablet 1 tablet   . ondansetron (ZOFRAN) injection 4 mg   . oxyCODONE (ROXICODONE) tablet 10 mg   . oxyCODONE (ROXICODONE) tablet 15 mg   . oxyCODONE (ROXICODONE) tablet 5 mg   . polyethylene glycol (MIRALAX) packet 17 g   . psyllium (METAMUCIL) 58.12 % packet 1 packet   . senna (SENOKOT) tablet 17.2 mg   . sodium chloride 0.9% infusion   . tiZANidine (ZANAFLEX) tablet 2 mg   . tiZANidine (ZANAFLEX) tablet 2 mg     Assessment:   68 year old male s/p above procedure.  Back pain improved on pain regimen. Agreeable to dc today    Plan:  - Immobilization: LSO for mobilization   - DVT Prophylaxis: no pharmacologic prophylaxis; SCDs/TED hose, ambulation  - Pain Medication: multimodal,  - Drain: removed  - Other: aggressive incentive spirometry  - Dispo: dc today    Frederic Jericho, MD  Orthopaedic Spine Fellow    Please page the Orthopaedic Spine Surgery Team with questions or concerns based on patient location:     Established spine floor patients at [place]: Ortho Spine 1 - [phone]      Established spine floor patients at [facility]: Ortho Spine 2 - [phone]      New spine consultations not yet followed by spine team:    View [facility] on call for "SPINE CONSULT" call schedule (Orthopaedic Spine versus Neurosurgery)   If Orthopaedic Spine is on call please page the [place] on call:   [place]: ORTHOPEDICS/TH   [place]: ORTHOPEDICS/HC

## 2019-09-13 NOTE — Brief Op Note (Signed)
BRIEF OPERATIVE NOTE    DATE: 09/06/2019  TIME: 12:46 PM    PREOPERATIVE DIAGNOSIS:   1. 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 Far lateral interbody fusion L4-5, L3-4 with PEEK cages (Nuvasive), formagraft, rhBMP-2  2. Placement of biomechanical vertebral structural devices consisting of PEEK cages x 2  3. Interpretation of intraoperative fluoroscopy  4. Interpretation of intraoperative neuromonitoring (Nuva+evokes)    ATTENDING SURGEON: Decarlo Rivet  ASSISTANTS(s): Attenello    ANESTHESIA: GETA    FINDINGS: spondylolisthesis    WOUND CLASSIFICATION:Class I (clean)    WOUND CLOSURE STATUS:All layers of surgical incision (deep and superficial) were fully closed.    SPECIMENS: None    Fluids/Blood Products:      IV Fluids: 1500cc LR    Blood Products: None    EBL: 10cc    Urine Output: [id number]    COMPLICATIONS: None    DISPOSITION: Stable to Stage 2

## 2019-09-13 NOTE — Brief Op Note (Signed)
BRIEF OPERATIVE NOTE    DATE: 09/06/2019  TIME: 3:48 PM    PREOPERATIVE DIAGNOSIS:   1. 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 S/p Stage 1 XLIF L4-5, L3-4    POSTOPERATIVE DIAGNOSIS:   Same    PROCEDURE:   1. Laminectomy L4, partial lamy L5 and L3 with bilateral facetectomies and foraminotomies L3-4 and L4-5  2. Smith Peterson posterior column osteotomies L4-5 and L3-4  3. Decompression of cauda equina and exploration of nerve roots bilateral L5, L4, L3  4. Posterior segmental spinal instrumentation and fusion L3 thru L5 with screws and rods (Depuy Synthes), local bone autograft, DBM (accel), beta-tricalcium phosphate (Magnetos)  5. Interpretation of intraoperative fluoroscopy  6. Interpretation of intraoperative neuromonitoring    ATTENDING SURGEON: Advik Weatherspoon  ASSISTANTS(s): Attenello    ANESTHESIA: GETA    FINDINGS: severe stenosis, facet hypertrophy, locked facets    WOUND CLASSIFICATION:Class I (clean)    WOUND CLOSURE STATUS:All layers of surgical incision (deep and superficial) were fully closed.    SPECIMENS: None    Fluids/Blood Products:      IV Fluids: 1500cc LR, 1L albumin    Blood Products: None    EBL: 250cc    Urine Output: 0000000    COMPLICATIONS: None    DISPOSITION: Stable to PACU

## 2019-09-13 NOTE — Op Note (Signed)
DATE OF SERVICE:  09/06/2019    PREOPERATIVE DIAGNOSIS:    1.  Degenerative spondylolisthesis, L4-L5 and L3-L4.  2.  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    POSTOPERATIVE DIAGNOSIS:    1.  Degenerative spondylolisthesis, L4-L5 and L3-L4.  2.  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    PROCEDURE PERFORMED:    1.  Far lateral interbody fusion, L4-L5, L3-L4, with PEEK cages  (NuVasive), Formagraft, rhBMP-2.   2.  Placement of biomechanical vertebral structural devices consisting  of polyetheretherketone cages x2.   3.  Interpretation of intraoperative fluoroscopy.  4.  Interpretation of intraoperative neuromonitoring (NuVasive and  evoked potentials).     SURGEON/STAFF:  Janeece Blok, MD    ASSISTANT:  John Da Vinci Beryle Flock, MD     Note, there was no qualified orthopedic surgery resident available to  assist with this case.     ANESTHESIA:  General.    FINDINGS:  Spondylolisthesis.    WOUND CLASSIFICATION:  Class 1.    WOUND CLOSURE STATUS:  All layers closed.    SPECIMENS:  None.    IV FLUIDS:  1500 cc LR.    BLOOD PRODUCTS:  None.    ESTIMATED BLOOD LOSS:  10 cc.    URINE OUTPUT:  200 cc.    COMPLICATIONS:  None.    INDICATIONS:  Patient is a 69 year old male who has ongoing worsening  complaints of low back pain and radiating bilateral leg pain with  progression of feelings of numbness and weakness and fatigue.  He has  pain that is especially limited with activities to the point where he  is unable to stand for more than a few minutes at a time and unable  to walk more than 50 feet due to complaints of low back pain as well  as feelings of progressive lower extremity fatigue and pain and  weakness.  He has relief with stopping to sit and rest as well as  with lying flat.  However, otherwise has ongoing and worsening  complaints of pain, which are significantly activity limiting and  interfering with activities of daily living.  Imaging demonstrates  evidence  of degenerative spondylolisthesis L4-L5 as well as L3-L4  with associated facet hypertrophy and spondylosis with resultant  severe stenosis with near complete obliteration of the cauda equina,  which does correlate with the distribution of his symptoms of low  back pain as well as radicular pain and neurogenic claudication.  He  has had extensive and exhaustive conservative management in the form  of time, activity modification, pain medication, physical therapy,  and injections without any sustained relief of his symptoms and with  ongoing progression.  He was interested in definitive surgical  address as we did discuss additional conservative options.  We  explained that the surgery would involve staged completion of far  lateral interbody fusion L4-L5 and L3-L4, followed then by  laminectomies and posterior segmental spinal instrumentation and  fusion L3 through L5.  We discussed the planned procedure as well as  risks benefits and alternatives with him in detail.  Risks include,  but are not limited to infection, bleeding, damage to nerves and  vessels, dural tear, ongoing back pain as well as leg pain and/or  weakness and/or numbness and paresthesias, hip flexor and/or  quadriceps pain, weakness, numbness, paresthesias, hardware  complications or failure, pseudarthrosis or nonunion, additional  procedures, as well as risk of general anesthesia including but not  limited to DVT, PE, stroke,  myocardial infarction, loss of life.  He  expressed his understanding and willingness to proceed in the form of  a signed consent.     PROCEDURE NARRATIVE:  The patient was met in the preoperative holding  area, where we again reviewed the planned procedure with him and  answered all of his questions.  The operative site was marked, and he  was subsequently taken to the operating theater where he succumbed to  general endotracheal anesthesia and was placed in the left lateral  decubitus position with the right side up with  all of his extremities  and bony prominences appropriately padded and protected with an  axillary roll and positioned.  He received appropriate preoperative  antibiotic prophylaxis.  Neuromonitoring needles were placed.  Preoperative fluoroscopic images were obtained for level confirmation  and incisional planning.  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  However, given the  kyphoscoliosis deformity and inability to access from the left, this  made right-sided approach a necessity.  The area over the right flank  again was prepped and draped in the usual sterile fashion.  Prior to  proceeding, we performed a formal preoperative verification and  time-out procedure in which we confirmed patient identity by name,  number, and birth date, as well as the planned procedure and  availability of all necessary equipment.     A 15 blade scalpel used to make a 1 cm incision incorporating a very  small portion of his extensive prior right retroperitoneal flank  incision, and this was carried down through superficial layers using  electrocautery maintaining excellent hemostasis.  He did have  extensive scar tissue present.  However, we were able to navigate  through this and we identified the deep fascial layer which was then  bluntly penetrated allowing access to the retroperitoneal space.  There were extensive adhesions present in this region.  However, we  were able to gently develop the plane with blunt finger dissection.  At this point, a 2 cm incision was made directly in the right flank  region overlying the L4 vertebral body.  This was carried down  through superficial layers using electrocautery and maintaining  excellent hemostasis.  Patient again was obese with significant depth  of tissue planes, and we therefore used deep retractors to even allow  visualization of the oblique fascia.  We were, however, able to  have  adequate access, and we turned our attention to the level in line  with the L4-L5 disk space.  The fibers of the oblique muscle layers  were bluntly separated, following which the transversalis fascia was  then bluntly penetrated allowing access to the previously developed  retroperitoneal space.  We then sequentially placed dilators onto the  psoas muscle, and a tubular retractor was then positioned which  allowed confirmation of integrity of our planned approach with  presence of the psoas muscle within our field.  The innermost dilator  was then attached to neuromonitoring and under fluoroscopic and  neuromonitoring guidance, we traversed the psoas muscle onto the  lateral aspect of the L4-L5 disk space.  There were significant  osteophytes present laterally, which made access challenging, in  addition to spondylolisthesis at the L4-L5 level.  However, we were  able to safely navigate and achieve access, and after confirming  positioning, a guide pin was placed to secure position.  We then  sequentially placed dilators with neuromonitoring  guidance, and a  tubular retractor was then positioned again with neuromonitoring  guidance and attached to a rigid table with side arm.  AP and lateral  fluoroscopic images were obtained demonstrating appropriate  positioning, and at this point we then used neuromonitoring probe to  once again confirm integrity of our retractor field confirming that  the critical neurologic structures were well posterior to the  retractor blades.  We then proceeded with diskectomy in standard  fashion with the annulotomy knife followed by Cobb elevators to  develop the cartilaginous endplates and release the contralateral  annulus.  Pituitary and Kerrison rongeurs in addition to ring  curettes and endplate scrapers and rasps were then used to further  complete diskectomy and prepare the endplates, taking care not to  violate the bony endplate.  We then placed blunt paddle rotators  which  we sized up to 12 mm in height for restoration of disk height  and to mobilize the disk space, and then we proceeded with trialing.  We first started with a 10-degree lordotic 8 x 18 x 50 mm trial,  which had loose interference fit.  We were able to obtain AP and  lateral fluoroscopic imaging demonstrating appropriate positioning,  and at this point then proceeded with sizing up of the trial  ultimately to 15 degree lordotic and 12 x 22 x 55 mm in dimension,  which was impacted into position again with excellent interference  fit and improved position with restoration of segmental parameters as  well as indirect foraminal decompression with restoration of  foraminal volume.  After confirming appropriate positioning on AP and  lateral fluoroscopic imaging, we then completed definitive endplate  preparation with ring curettes and endplate scrapers and rasps, and I  proceeded with preparation of the definitive PEEK cage, which was  similarly sized (NuVasive) and prepared with Formagraft and rhBMP-2.  This was then impacted into position again under direct visual as  well as fluoroscopic guidance.  After confirming appropriate  positioning, bone wax was placed over the lateral aspect of the cage.   Hemostatic agents were placed.  Tubular retractor was then removed  under direct visual guidance confirming excellent hemostasis as well  as integrity of all tissue planes.     At this point, we then turned our attention to approach to the L3-L4  disk space.  In similar fashion, we identified the oblique muscle  fascia and the fibers of the oblique muscle layers were then bluntly  separated, following which the transversalis fascia was bluntly  penetrated allowing access to the previously developed  retroperitoneal space.  We then sequentially placed dilators onto the  psoas muscle, once more confirming integrity of our planned approach  with placement of the retractor and presence of the psoas muscle  within our field.   Innermost dilator was then attached to  neuromonitoring and under fluoroscopic and neuromonitoring guidance,  we traversed the psoas muscle again onto the lateral aspect of the  L3-L4 disk space.  After confirming appropriate positioning, a guide  pin was used to secure placement of the inner dilator, and we then  sequentially dilated up with neuromonitoring guidance, following  which a tubular retractor was placed and attached to rigid table side  arm.  AP and lateral fluoroscopic images were obtained demonstrating  appropriate positioning.  At this point, we then once more confirmed  integrity of our planned approach with neuromonitoring to evaluate  the retractor field, and we identified neurologic structures well  posterior  to the retractor blades.  We then proceeded with diskectomy  in standard fashion with annulotomy knife followed by Cobb elevators  to develop the cartilaginous endplates and release the contralateral  annulus.  Pituitary and Kerrison rongeurs in addition to ring  curettes and endplate scrapers and rasps were used to complete  diskectomy.  We then proceeded with trialing in similar fashion,  first with placement of blunt paddle rotators.  We then placed 10  degree lordotic 8 x 18 x 55 mm trial which had loose interference  fit.  However, we were able to evaluate appropriate positioning on AP  and lateral fluoroscopic imaging.  We then sized up to a 15-degree  lordotic 12 x 22 x 55 mm trial, which had excellent interference fit  with restoration of segmental parameters and improvement of foraminal  volume and disk height.  We completed definitive endplate preparation  and proceeded with placement of definitive PEEK cage (NuVasive),  which was prepared with Formagraft and rhBMP-2.  This was then  impacted in its position under direct visual as well as fluoroscopic  guidance with excellent interference fit.  Bone wax was placed over  the lateral aspect of the cage.  Hemostatic agents were  placed.  Tubular retractor was then removed under direct visual guidance  confirming excellent hemostasis as well as integrity of all tissue  planes.     At this point, we then proceeded with closure.  Both incisions were  similarly copiously irrigated, and we confirmed excellent hemostasis.   Vancomycin powder was placed.  Fascia was reapproximated with #1  Vicryl in figure-of-eight fashion.  Superficial layers were closed in  layered fashion with 0 and 2-0 Vicryl.  Skin was closed with 4-0  Monocryl in running subcuticular fashion.  Dermabond was placed as a  skin sealant.  Sterile dressings were placed.     DISPOSITION:  Patient tolerated first stage of surgery without  difficulty and is deemed appropriate to proceed with completion of  final stage of surgery, which will be dictated under separate  heading.     There were no neuromonitoring issues throughout this case.      Job #:  [id number]

## 2019-09-17 ENCOUNTER — Telehealth (HOSPITAL_BASED_OUTPATIENT_CLINIC_OR_DEPARTMENT_OTHER): Payer: Self-pay

## 2019-09-17 NOTE — Op Note (Signed)
DATE OF SERVICE:  09/06/2019    PREOPERATIVE DIAGNOSIS:    1.  Degenerative spondylolisthesis L4-L5 and L3-L4.  2.  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  3.  Status post stage 1 far lateral interbody fusion L4-L5, L3-L4.    POSTOPERATIVE DIAGNOSIS:    1.  Degenerative spondylolisthesis L4-L5 and L3-L4.  2.  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  3.  Status post stage 1 far lateral interbody fusion L4-L5, L3-L4.    PROCEDURE PERFORMED:    1.  Laminectomy L4, partial laminectomies L5 and L3 with bilateral  facetectomies and foraminotomies L3-L4 and L4-L5.   2.  Stephen [name] posterior column osteotomies, L4-L5 and L3-L4.  3.  Decompression of cauda equina and exploration of nerve roots  bilateral L5, L4, L3.   4.  Posterior segmental spinal instrumentation and fusion L3 through  L5 with screws and rods (DePuy Synthes), local bone autograft, DBM  (Accell), beta tricalcium phosphate (MagnetOs).   5.  Interpretation of intraoperative fluoroscopy.  6.  Interpretation of intraoperative neuromonitoring.    SURGEON/STAFF:  Noele Icenhour, MD    ASSISTANT:  John Da Vinci Beryle Flock, MD     Note, there was no qualified orthopedic surgery resident available to  assist with this case.     ANESTHESIA:  General.    FINDINGS:  Severe stenosis, facet hypertrophy, locked facets.    WOUND CLASSIFICATION:  Class 1.    WOULD CLOSURE STATUS:  All layers closed.    SPECIMENS:  None.    IV FLUIDS:  1500 cc LR, 1 L albumin.    BLOOD PRODUCTS:  None.    ESTIMATED BLOOD LOSS:  250 cc.    URINE OUTPUT:  300 cc.    COMPLICATIONS:  None.    INDICATIONS:  The patient is a 68 year old male who has ongoing  complaints of worsening low back pain as well as radiating bilateral  leg pain with progression of feelings of numbness and weakness  throughout bilateral lower extremities.  He notes that he is unable  to stand or walk for any prolonged length of time due to feelings of  weakness and his  leg giving way in addition to fatigability.  He has  relief with sitting to stop and rest, however, cannot walk more than  50 feet or stand for more than a few minutes at a time.  Imaging  demonstrates evidence of degenerative spondylolisthesis L4-L5 as well  as L3-L4 with associated facet hypertrophy and severe stenosis which  does correlate with the distribution of his symptoms.  He has had  extensive and exhaustive conservative management in the form of time,  activity modification, pain medications, physical therapy, and  injections without any sustained relief of his symptoms and with  ongoing progression.  We discussed options in extensive detail  including continued conservative measures, however, he was interested  in definitive options including surgical address.  We explained that  this would involve staged approach with far lateral interbody fusion  L4-L5 and L3-L4 followed by laminectomies and posterior segmental  spinal instrumentation and fusion L3 through L5.  We discussed the  planned procedure as well as risks, benefits, and alternatives with  him in detail.  Risks include but are not limited to infection,  bleeding, damage to nerves and vessels, dural tear, ongoing back pain  as well as leg pain and/or weakness and/or numbness and paresthesias,  hardware complications or failure, pseudarthrosis or nonunion,  additional procedures, as well as the risk  of general anesthesia  including but not limited to DVT, PE, stroke, myocardial infarction,  loss of life.  He expressed an understanding and willingness to  proceed in the form of a signed consent.     DESCRIPTION OF PROCEDURE:  The patient was met in the preoperative  holding area where we again reviewed the planned procedure with him  and answered all of his questions.  The operative site was marked and  he was subsequently taken to the operating theater where he succumbed  to general endotracheal anesthesia and was placed first in the  lateral  decubitus position with all of his extremities and bony  prominences appropriately padded and protected.  Neuromonitoring  needles were placed.  He received appropriate preoperative antibiotic  prophylaxis.  He then underwent the first stage of surgery consisting  of far lateral interbody fusion L4-L5 and L3-L4.  For additional  details with regard to this portion of the procedure, please refer to  the separately dictated operative note.  Upon completion of the first  stage, he was deemed appropriate to proceed with the 2nd stage of  surgery and as such was transferred from the lateral decubitus  position and placed prone on a Jackson frame with all his extremities  and bony prominences appropriately padded and protected.  Neuromonitoring needles were retained.  Antibiotic prophylaxis was  appropriately redosed.  The area over the low back was prepped and  draped in the usual sterile fashion.  Prior to proceeding we  performed a formal preoperative verification time-out procedure in  which we confirmed patient identity by name, number, and birth date,  as well as the planned procedure and availability of all necessary  equipment.     A 15-blade scalpel was used to make a midline incision extending from  L3 through L5 and this was carried down through superficial layers  using electrocautery maintaining excellent hemostasis.  We identified  lumbodorsal fascia which was then incised bilaterally and the deep  paraspinal muscles were elevated in subperiosteal fashion exposing  the underlying bony elements from L3 through L5.  A Kocher clamp was  placed at the L4-L5 interspinous ligament and lateral intraoperative  fluoroscopic image was obtained for level of confirmation.  This was  recorded as an intraoperative radiographic time-out procedure.  We  continued with exposure of the bony elements, placing deep retractors  again to facilitate visualization.  After adequate exposure was  achieved we then proceeded with  laminectomies.  A Leksell rongeur was  used to resect the spinous process and lamina of L4 in addition to  the cephalad aspect of the spinous process and lamina of L5 and  caudal aspect of the spinous process and lamina of L3.  We further  thinned out the lamina again with high-speed matchstick bur.  The  patient had significant facet hypertrophy with extension into the  interlaminar space with resultant severe stenosis as a result of bony  as well as capsular hypertrophy.  The large facets which were  overgrown and locked into position.  Therefore, a Leksell rongeur was  used to resect the inferior articular processes at L3 as well as L4  as well as a portion of the superior articular process to complete  facetectomies to facilitate mobilization of the spinal elements.  We  then turned our attention again to decompression using straight and  forward angle Epstein curettes to develop a plane between ligamentum  flavum and the overlying bony insertion on the undersurface of the  L4  lamina.  We then proceeded with completion of laminectomies with 3  and 4 mm Kerrison rongeur first completing central laminectomy of L4,  which we then widened from pedicle to pedicle.  There was extensive  ligamentum flavum hypertrophy resulting in severe stenosis with near  complete obliteration of the cauda equina at L4-L5 as well as L3-L4.  We completed in a meticulous fashion to elevate hypertrophic  ligamentum flavum, which was essentially scarred down to the cauda  equina in multiple locations and taking care not to violate the dura.   We achieved wide decompression again from pedicle to pedicle and  resected hypertrophied ligamentum flavum at L4-L5 and L3-L4,  achieving decompression of the cauda equina with marked improvement  in the overall caliber and turgor of the dura.  We then completed  partial laminectomy of the caudal aspect of L3 as well as cephalad  aspect of L5, once more with 3 and 4 mm Kerrison rongeurs to  achieve  first central decompression, which we then widened from pedicle to  pedicle to ensure wide decompression of the lateral recesses.  We  then completed bilateral facetectomies with a combination of  quarter-inch osteotome as well as high-speed matchstick bur at L3-L4,  as well as L4-L5 to allow access and decompression of the  subarticular region and further decompress the lateral recesses.  We  completed bilateral foraminotomies at both L3-L4 as well as L4-L5  with 2 and 3 mm Kerrison rongeurs to ensure wide decompression of the  exiting nerve roots.  At this point, in attempt to mobilize the spine  further given a large hypertrophic nature of continued presence of  residual facet components, we proceeded with posterior column  Smith-Petersen osteotomy.  A high-speed matchstick bur in addition to  Leksell rongeur was used to resect across the pars first of L3 which  then allowed [name] to remove the intra-articular process and we then  used a high-speed matchstick bur to resected the superior articular  process of L4 to the level of the superior edge of the pedicle.  Following which we thoroughly resected the periarticular process of  L3 as well as superior articular process of L4 to allow for a  thorough decompression of the exiting nerve roots.     In a similar fashion we then turned our attention to the L4-L5 level.   A high-speed matchstick bur was used to resect across the pars of  L4, allowing [name] to use a Leksell rongeur to resect the inferior  articular process of L4 and similarly we resected the superior  articular process of L5 to the level of the cephalad edge of the  pedicle of L5 to allow for thorough decompression and completion of  posterior column Smith-Petersen osteotomies at L3-L4, as well as  L4-L5.  We confirmed wide decompression of the exiting nerve roots  and exploration of L3, L4, and L5 roots which were found to be widely  decompressed and patent.     At this point we then turned our  attention to placement of  instrumentation.  We first copiously irrigated the wound and  confirmed excellent hemostasis.  We used visual anatomic landmarks to  identify planned starting points for pedicle screws at L3, L4, and  L5.  A high-speed matchstick bur was used to create pilot hole  bilaterally.  A gearshift pedicle finder was used to cannulate  pedicles bilaterally accessing the respective vertebral bodies of L3  through L5.  Radiographic markers were  placed.  AP and lateral  fluoroscopic images were obtained demonstrating appropriate  positioning of planned pedicle screw tracks.  We then proceeded with  placement of screws.  A 4.35 mm tap was used to prepare screw tracts  bilaterally at L3, L4, and L5.  Tap was tested with neuromonitoring  at each level and fell well within acceptable limits.  We then  proceeded with placement of screws through which 6 x 55 mm screws  were placed bilaterally at L3, L4, and L5 with adequate purchase  (DePuy Synthes Cortical Fix Expedium).  All screws were tested with  neuromonitoring and fell well within acceptable limits.  AP and  lateral fluoroscopic images were obtained demonstrating appropriate  positioning of screws.     At this point, we then proceeded with rod placement.  We sized two 75  mm rods which were contoured appropriately with a [place] bender to  introduce additional lordosis to allow for continued segmental  correction.  Rods were then placed bilaterally and reduced with  reduction towers and following which we proceeded with placement of  set screws.  Rods were then locked into position with torque limiting  screwdriver compressing as we final tightened between L4-L5 and L3-L4  in order to achieve additional segmental sagittal alignment  correction.  We then proceeded with preparation of the posterolateral  fusion bed.  A high-speed matchstick bur was used to decorticate the  bony elements including transverse processes as well as residual bony  elements  and lamina, and superior and inferior articular processes at  L3 through L5.  We then placed local bone autograft in addition to  demineralized bone matrix putty (Accell) into the posterolateral  fusion bed.  This was then reinforced with beta tricalcium phosphate  (MagnetOs).  We obtained final AP and lateral fluoroscopic imaging  demonstrating excellent positioning of instrumentation with  restoration of segmental parameters.     At this point, we then proceeded with closure.  Vancomycin powder was  placed.  A 10 mm flat drain was placed deep to the lumbodorsal  fascia.  Fascia was reapproximated with #1 Vicryl in figure-of-eight  fashion.  Superficial layers were closed in layered fashion with 0  and 2-0 Vicryl.  Skin was closed with 3-0 Monocryl in running  subcuticular fashion.  Dermabond was placed as a skin sealant.  Sterile dressings were placed.  Patient was then transferred to a  regular bed and extubated without difficulty.     DISPOSITION:  Patient returned to PACU in stable condition.  We will  continue mechanical DVT prophylaxis.  Mobilize with physical therapy  and LSO brace.  Continue drain with prophylactic antibiotics while  drain in place.     There were no neuromonitoring issues throughout this case.      Job #:  [id number]

## 2019-09-17 NOTE — Telephone Encounter (Signed)
Transitional Telephonic Nurse (TTN) Post Discharge Manual Follow-Up Telephone Encounter   Inpatient Encounter CSN: [id number]  Admission Date: 2019-09-06  Discharge Date: 2019-09-12  Autocall Status: No Response  Call Date: 2019-09-13  Contact Number: [phone]  Primary Diagnosis: DEGENERATIVE SPONDYLOLISTHESIS  Clinician: Fritz Pickerel MSN, RN  Phone: [phone]   Email: [email or web address]   2019-09-17 10:40:09 - Pending Resolution; No Answer/Voicemail 1st Attempt;     2019-09-17 10:44:13 - Patient Reached - All Issues Resolved; Encounter Closed   Contacted: Caregiver    Other Interventions: Educated Patient/Caregiver  Spoke with Stephen [name], pt's sister and authorized person to contact. She relayed that the pt is doing well, [place] came in today for dressing change, denies redness, discharge, bleeding or opening of the incision site. Denies fever, chills, nausea, vomiting, uncontrolled pain. She mentioned that the patient is taking all his home medications as ordered, denies any questions or issues with meds. Pt is managing his own meds and pain is well controlled at the moment. HH PT will be in tomorrow at 9 AM. Pt reports spontaneously voiding, ambulating, tolerating regular diet. Reviewed AVS instruction on activity: no BLT (bending, lifting over 5 lbs, twisting). She relayed that the pt is following this recommendation. Aware of the wound care/shower instructions. Reviewed the s/sx to seek immediate medical attention or return to the nearest ED as per AVS. She verbalized understanding. Reviewed upcoming appt with Dr. Lelon Huh (Ortho) on [id number] at 1320. Transportation provided by [employer].

## 2019-09-17 NOTE — Anesthesia Postprocedure Evaluation (Signed)
Anesthesia Post Note    Patient: Stephen [name]    Procedure(s) Performed: Procedure(s):  FUSION, SPINE, LUMBAR, extreme lateral interbody fusion, 2 LEVELS, L3 through L5  LAMINECTOMY, SPINE, LUMBAR, 2 LEVELS, WITH FUSION USING INSTRUMENTATION, POSTERIOR APPROACH L3 through4 L5      Final anesthesia type: General    Patient location: PACU    Post anesthesia pain: adequate analgesia    Mental status: awake, alert  and oriented    Airway Patent: Yes    Last Vitals:   Vitals Value Taken Time   BP 122/73 09/06/19 1845   Temp 36.6 C 09/06/19 1830   Pulse 82 09/06/19 1845   Resp 10 09/06/19 1845   SpO2 94 % 09/06/19 1845        Post vital signs: stable    Hydration: adequate    N/V:no    Anesthetic complications: no    Plan of care per primary team.

## 2019-09-20 ENCOUNTER — Other Ambulatory Visit: Payer: Self-pay

## 2019-09-23 ENCOUNTER — Ambulatory Visit
Admission: RE | Admit: 2019-09-23 | Discharge: 2019-09-23 | Disposition: A | Discharge status: Home / Self Care | Payer: Medicare Other | Attending: Orthopaedic Surgery of the Spine | Admitting: Orthopaedic Surgery of the Spine

## 2019-09-23 ENCOUNTER — Other Ambulatory Visit: Payer: Self-pay

## 2019-09-23 ENCOUNTER — Ambulatory Visit (HOSPITAL_BASED_OUTPATIENT_CLINIC_OR_DEPARTMENT_OTHER): Payer: Medicare Other | Admitting: Orthopaedic Surgery of the Spine

## 2019-09-23 ENCOUNTER — Encounter (HOSPITAL_BASED_OUTPATIENT_CLINIC_OR_DEPARTMENT_OTHER): Payer: Self-pay | Admitting: Orthopaedic Surgery of the Spine

## 2019-09-23 VITALS — BP 112/50 | HR 63 | Wt 208.0 lb

## 2019-09-23 DIAGNOSIS — Z981 Arthrodesis status: Secondary | ICD-10-CM

## 2019-09-23 DIAGNOSIS — Z09 Encounter for follow-up examination after completed treatment for conditions other than malignant neoplasm: Secondary | ICD-10-CM

## 2019-09-23 MED ORDER — TIZANIDINE HCL 4 MG OR TABS
4.0000 mg | ORAL_TABLET | Freq: Four times a day (QID) | ORAL | 0 refills | Status: DC | PRN
Start: 2019-09-23 — End: 2019-10-09
  Filled 2019-09-23: qty 60, 15d supply, fill #0

## 2019-09-23 MED ORDER — OXYCODONE HCL 10 MG OR TABS
ORAL_TABLET | ORAL | 0 refills | Status: DC
Start: 2019-09-23 — End: 2020-02-14
  Filled 2019-09-23: qty 90, 23d supply, fill #0

## 2019-09-23 NOTE — Interdisciplinary (Signed)
Applied steri strips to spine incision site and applied mepilex post op AG dressing to all incision sites per DR. Z

## 2019-09-23 NOTE — Patient Instructions (Signed)
Hello, my name is Abhay Godbolt I am the Medical Assistant who helped you today.     It was a pleasure assisting you today. If you have any questions please feel free to call us at [phone].     We are committed to making sure our patients receive the best care. We mail and e-mail satisfaction surveys randomly to patients after their visits. If you happen to get one, we'd greatly appreciate you taking a few minutes to fill it out to let us know what you liked best about your visit and if there is anything we can improve. We use that information to be sure you are receiving the best care, as well as to recognize staff members who do a great job.    Thank you for choosing [facility] for your healthcare needs. Have a great day!

## 2019-09-23 NOTE — Progress Notes (Signed)
[facility] [place] [facility]  Orthopaedic Spine - Clinic Note    PRIMARY CARE PROVIDER:   [name]    REASON FOR VISIT: Thoracolumbar Spine postoperative evaluation    DATE OF SURGERY: 09/06/19    PROCEDURE: XLIF L4/5 and L5/S1, PSIF L4-S1     Subjective:     Stephen [name] is a 68 year old male who presents to clinic for postoperative evaluation of above. The patient is doing well overall. He does have some muscular paraspinal back pain. He is using a cane to ambulate. He states that [place] and [employer] nursing have been excellent, and he is interested in [place]. He has been wearing his LSO brace without issue. He denies leg pain, weakness, numbness, tingling. He denies fever/chills.    Physical Examination:     Vital signs reviewed BP 112/50 (BP Location: Left arm, BP Patient Position: Sitting, BP cuff size: Regular)   Pulse 63   Wt 94.3 kg (208 lb)   BMI 32.58 kg/m   Psychiatric: Alert and oriented to person, place, and date. Normal affect.  General Appearance: healthy, alert, no distress, pleasant affect, cooperative.  HEENT: NC/AT, EOMI, sclera anicteric    CARDIO: RRR  PULM: NLB  ABD: ND  NEURO/MUSCULOSKELETAL:     Lumbar Spine Exam  General: Dressings c/d/i. Posterior back incision healing well with no erythema, fluctuance, or drainage. Distal most aspect of incision with slight delay in healing, but no evidence of infection. Lateral incision c/d/i.    Lower Extremity Motor Strength    Muscle Left/5 Right/5 Comment   Hip flexion (L2) 5 5    Knee extension (L3) 5 5    Tibialis anterior (L4) 5 5    Extensor hallucis (L5) 5 5    gastrocsoleus (S1) 5 5      Lower Extremity Sensation to Light Touch  Nerve Left Right Comment   L2 Intact Intact    L3 Intact Intact    L4 Intact Intact    L5 Intact Intact    S1 Intact Intact      Special Testing    Patellar Reflex:  2+ L, 2+ R  Achilles Reflex:  2+ L, 2+ R  Clonus    negative  Straight Leg Raise  Deferred due to post-op  state  Babinski   downgoing        Imaging Studies:     X-ray Lumbosacral Spine 2 Or 3 Views    Result Date: 09/23/2019  IMPRESSION: Redemonstration of posterior instrumented fixation and interbody grafts from L3-L5 without interval hardware complication/failure. Interbody grafts are not yet incorporated.  Other likely chronic findings are not significantly changed.    X-ray Lumbosacral Spine 2 Or 3 Views    Result Date: 09/08/2019  IMPRESSION: Satisfactory immediate postop alignment from L3 through L5 fixation/fusion.    Assessment:     ICD-10-CM ICD-9-CM    1. S/P lumbar fusion  Z98.1 V45.4 X-Ray Lumbosacral Spine 2 Or 3 Views      Ortho Cast Room Serv Req      Home Health     Plan:    Patient with slight delay in healing of most distal aspect of incision, but no evidence of infection or dehiscence. Silver mepilex dressing and steri strips applied. HHPT referral placed. The plan and findings on exam and/or imaging were discussed with the patient.  We discussed the standard postoperative course and reviewed possible signs of complications.  We recommended continuing  with LSO brace, HHOT, [place] nursing.  All the patient's questions were answered today in clinic.    Follow-up: 4 weeks    Patient seen and evaluated with Dr. Lelon Huh.    Rachael Darby, MD  Resident Physician  Department of Orthopaedic Surgery, [place]

## 2019-09-24 ENCOUNTER — Encounter (HOSPITAL_COMMUNITY): Payer: Self-pay | Admitting: Orthopaedic Surgery of the Spine

## 2019-09-25 NOTE — Addendum Note (Signed)
Addendum  created 09/25/19 1433 by Estelle Grumbles, MD    Clinical Note Signed

## 2019-09-27 ENCOUNTER — Other Ambulatory Visit: Payer: Self-pay

## 2019-09-27 ENCOUNTER — Telehealth (INDEPENDENT_AMBULATORY_CARE_PROVIDER_SITE_OTHER): Payer: Managed Care, Other (non HMO) | Admitting: Cardiovascular Disease

## 2019-09-27 ENCOUNTER — Encounter: Payer: Self-pay | Admitting: Cardiovascular Disease

## 2019-09-27 VITALS — HR 64 | Ht 68.0 in | Wt 240.0 lb

## 2019-09-27 DIAGNOSIS — I359 Nonrheumatic aortic valve disorder, unspecified: Secondary | ICD-10-CM

## 2019-09-27 DIAGNOSIS — I2510 Atherosclerotic heart disease of native coronary artery without angina pectoris: Secondary | ICD-10-CM | POA: Diagnosis not present

## 2019-09-27 DIAGNOSIS — E785 Hyperlipidemia, unspecified: Secondary | ICD-10-CM

## 2019-09-27 NOTE — Patient Instructions (Signed)
Medication Instructions:  Continue same medications  If you need a refill on your cardiac medications before your next appointment, please call your pharmacy.   Lab work: Labs to be done in November as planned If you have labs (blood work) drawn today and your tests are completely normal, you will receive your results only by: Marland Kitchen MyChart Message (if you have MyChart) OR . A paper copy in the mail If you have any lab test that is abnormal or we need to change your treatment, we will call you to review the results.  Testing/Procedures: None  Follow-Up: At [facility], you and your health needs are our priority.  As part of our continuing mission to provide you with exceptional heart care, we have created designated Provider Care Teams.  These Care Teams include your primary Cardiologist (physician) and Advanced Practice Providers (APPs -  Physician Assistants and Nurse Practitioners) who all work together to provide you with the care you need, when you need it. You will need a follow up appointment in 6 months.  Please call our office 2 months in advance to schedule this appointment.  You may see Kathlyn Sacramento, MD or one of the following Advanced Practice Providers on your designated Care Team:   Murray Hodgkins, NP Christell Faith, PA-C . Marrianne Mood, PA-C

## 2019-09-27 NOTE — Progress Notes (Signed)
Virtual Visit via Telephone Note   This visit type was conducted due to national recommendations for restrictions regarding the COVID-19 Pandemic (e.g. social distancing) in an effort to limit this patient's exposure and mitigate transmission in our community.  Due to his co-morbid illnesses, this patient is at least at moderate risk for complications without adequate follow up.  This format is felt to be most appropriate for this patient at this time.  The patient did not have access to video technology/had technical difficulties with video requiring transitioning to audio format only (telephone).  All issues noted in this document were discussed and addressed.  No physical exam could be performed with this format.  Please refer to the patient's chart for his  consent to telehealth for [facility].   Date:  09/27/2019   ID:  Johnny Holland, DOB [birth year], MRN [id number]  Patient Location: Home Provider Location: Office  PCP:  Ria Bush, MD  Cardiologist:  Kathlyn Sacramento, MD  Electrophysiologist:  None   Evaluation Performed:  Follow-Up Visit  Chief Complaint: Follow-up visit.  History of Present Illness:    Johnny Holland is a 68 y.o. male was reached via phone visit for follow-up regarding CAD noted on prior CT imaging and aortic insufficiency.  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  Echocardiogram in April 2019 showed normal LV systolic function, grade 2 diastolic dysfunction, mildly calcified aortic valve without significant stenosis, mild to moderate aortic regurgitation and no evidence of pulmonary hypertension.  Previous CT scan showed LAD and left circumflex calcifications. Nuclear stress test in 2019 showed small apical defect felt to represent artifact with normal ejection fraction. Most recent echocardiogram in June 2020 showed an EF of 60 to 65%,  moderately calcified aortic valve with mild aortic insufficiency and stenosis.    He was seen by Christell Faith in August of this year with worsening exertional dyspnea.  He underwent CTA of the coronary arteries which showed a calcium score of 580, nondominant RCA with 50% proximal stenosis and moderate proximal LAD stenosis.  No significant lesions by FFR.  Based on this, he was switched from pravastatin to atorvastatin.  He is doing very well with no chest pain, shortness of breath or myalgia.  The patient does not have symptoms concerning for COVID-19 infection (fever, chills, cough, or new shortness of breath).    Past Medical History:  Diagnosis Date  . Aortic regurgitation 10/15/2012   Echocardiogram showing calcified aortic valves, mild AS, mod AR, preserved EF 55-65%, mild LV dilation and LVH (02/2017). rec rpt 1 yr.   Marland Kitchen CAD (coronary artery disease) 01/2017   4v by CT  . Carotid stenosis 01/12/2016   Minimal on [name] (01/2016) f/u PRN   . COPD (chronic obstructive pulmonary disease) ([place]) 01/2017   mild paraseptal and centrilobular by CT  . Diverticulitis   . Diverticulosis of colon   . Duodenal ulcer   . Hepatic steatosis 01/2017   by CT  . History of smoking   . HLD (hyperlipidemia)   . Right shoulder pain    impingement with partial RTC tear ([place])  . Right-sided Bell's palsy 12/06/2018  . Thoracic aorta atherosclerosis ([place]) 01/2017   by CT   Past Surgical History:  Procedure Laterality Date  . CARDIOVASCULAR STRESS TEST  04/2018   low risk study, no ischemia  . COLONOSCOPY  11/30/06   diverticulosis; no polyps  . INCISE  AND DRAIN ABCESS     Hemmorhoid  . VASECTOMY     x 2     Current Meds  Medication Sig  . albuterol (PROVENTIL HFA;VENTOLIN HFA) 108 (90 Base) MCG/ACT inhaler Inhale 2 puffs into the lungs every 6 (six) hours as needed for wheezing or shortness of breath.  Marland Kitchen aspirin 81 MG tablet Take 1 tablet (81 mg total) by mouth daily.  Marland Kitchen atorvastatin (LIPITOR)  40 MG tablet Take 1 tablet (40 mg total) by mouth daily.  . Multiple Vitamin (MULTI-VITAMIN DAILY PO) Take by mouth.  . sildenafil (VIAGRA) 100 MG tablet Take 0.5-1 tablets (50-100 mg total) by mouth daily as needed for erectile dysfunction.     Allergies:   Erythromycin   Social History   Tobacco Use  . Smoking status: Former Smoker    Packs/day: 1.00    Years: 42.00    Pack years: 42.00    Quit date: 08/13/2011    Years since quitting: 8.1  . Smokeless tobacco: Never Used  . Tobacco comment: Using electronic cigarettes now  Substance Use Topics  . Alcohol use: Yes    Comment: Social  . Drug use: No     Family Hx: The patient's family history includes Alcohol abuse in his father and sister; Alzheimer's disease in an other family member; Breast cancer in his maternal aunt; COPD in his mother; Cataracts in his mother; Cirrhosis in his father; Depression in his sister; Diabetes in his paternal grandmother; Emphysema in his father; Heart attack (age of onset: 3) in his father; Heart failure in his father; Macular degeneration in his mother.  ROS:   Please see the history of present illness.     All other systems reviewed and are negative.   Prior CV studies:   The following studies were reviewed today:  I discussed results of recent cardiac CTA with him.  Labs/Other Tests and Data Reviewed:    EKG:  No ECG reviewed.  Recent Labs: 12/03/2018: ALT 26; Hemoglobin 14.6; Platelets 233 02/26/2019: TSH 1.25 07/26/2019: BUN 13; Creatinine, Ser 1.15; Potassium 4.7; Sodium 141   Recent Lipid Panel Lab Results  Component Value Date/Time   CHOL 167 02/26/2019 08:04 AM   TRIG 149.0 02/26/2019 08:04 AM   HDL 51.90 02/26/2019 08:04 AM   CHOLHDL 3 02/26/2019 08:04 AM   LDLCALC 85 02/26/2019 08:04 AM   LDLDIRECT 130.1 10/10/2011 09:38 AM    Wt Readings from Last 3 Encounters:  09/27/19 240 lb (108.9 kg)  07/26/19 243 lb 12.8 oz (110.6 kg)  03/01/19 243 lb 3 oz (110.3 kg)      Objective:    Vital Signs:  Pulse 64   Ht 5\' 8"  (1.727 m)   Wt 240 lb (108.9 kg)   BMI 36.49 kg/m    VITAL SIGNS:  reviewed  ASSESSMENT & PLAN:     1.  Coronary artery disease involving native coronary arteries without angina: Recent CTA showed moderate nonobstructive coronary artery disease.  No significant lesions by FFR.  I discussed with him the importance of aggressive medical therapy and I agree with the recent switch of pravastatin to atorvastatin.  Continue low-dose aspirin.  2.  Aortic valve disease: Most recent echocardiogram showed mild aortic stenosis and mild aortic insufficiency.  Recommend a follow-up echocardiogram in 2 years.  3.  Hyperlipidemia: He is tolerating atorvastatin very well.  He is coming back for repeat labs next month.  4.  Previous tobacco use: He quit.  COVID-19 Education: The signs  and symptoms of COVID-19 were discussed with the patient and how to seek care for testing (follow up with PCP or arrange E-visit).  The importance of social distancing was discussed today.  Time:   Today, I have spent 5 minutes with the patient with telehealth technology discussing the above problems.     Medication Adjustments/Labs and Tests Ordered: Current medicines are reviewed at length with the patient today.  Concerns regarding medicines are outlined above.   Tests Ordered: No orders of the defined types were placed in this encounter.   Medication Changes: No orders of the defined types were placed in this encounter.   Follow Up:  Either In Person or Virtual Visit in 6 month(s)  Signed, Kathlyn Sacramento, MD  09/27/2019 10:58 AM    [place]

## 2019-10-08 ENCOUNTER — Telehealth (HOSPITAL_BASED_OUTPATIENT_CLINIC_OR_DEPARTMENT_OTHER): Payer: Self-pay | Admitting: Orthopaedic Surgery of the Spine

## 2019-10-08 NOTE — Telephone Encounter (Signed)
DOS 09/06/19: L3-5 fusion. Dr. Earnest Conroy. Left message for call back.

## 2019-10-08 NOTE — Telephone Encounter (Signed)
Patient is calling stating that he is in a lot of pain.    He states that he is currently taking oxyCODONE (ROXICODONE) 10 MG tablet and it is not helping.    He is experiencing more pain than usual in the sacral area and he is doing PT and everything else he was told to do but its not helping.     He is having trouble getting out of bed.     Please f/u with patient to further assess.    Pharmacy of choice is:     [id number] [place], [place]   [address], [place] [address]   Phone:  [phone] Fax:  [phone]   DEA #:  [id number]

## 2019-10-08 NOTE — Telephone Encounter (Signed)
Spoke to pt: His pain has increased over the past few weeks to lumbar area. "It's hard to walk now." he has no new swelling or drainage to surgical site. No fever. No change in B/B function. I explained that he is asking for something stronger than Oxycodone 10mg  . He will need recheck with Gwenyth Ober for 10/22 11am. He will call his insurance for transport. If he can make an appt 10/21 at 2:30pm-please change to [facility] with [place].

## 2019-10-08 NOTE — Telephone Encounter (Signed)
LOV 09/23/19: Dr. Earnest Conroy:    The patient is doing well overall. He does have some muscular paraspinal back pain. He is using a cane to ambulate.     Left VM. He was doing well on 09/23/19 when seen by Dr. Earnest Conroy. Now with increased pain and Oxycodone 10mg  is not helping. He needs reassessment. There are open appts for Gwenyth Ober in [facility] Wednesday and [place] Thursday.

## 2019-10-08 NOTE — Telephone Encounter (Signed)
Pt returning [place] call.      Best # [phone]

## 2019-10-08 NOTE — Telephone Encounter (Signed)
Patient calling RN back.    Please try patient again.

## 2019-10-09 ENCOUNTER — Ambulatory Visit: Payer: Medicare Other | Attending: Nurse Practitioner | Admitting: Nurse Practitioner

## 2019-10-09 ENCOUNTER — Ambulatory Visit (HOSPITAL_BASED_OUTPATIENT_CLINIC_OR_DEPARTMENT_OTHER)
Admission: RE | Admit: 2019-10-09 | Discharge: 2019-10-09 | Disposition: A | Discharge status: Home Health | Payer: Medicare Other

## 2019-10-09 ENCOUNTER — Encounter (HOSPITAL_BASED_OUTPATIENT_CLINIC_OR_DEPARTMENT_OTHER): Payer: Self-pay | Admitting: Nurse Practitioner

## 2019-10-09 VITALS — BP 111/70 | HR 73 | Temp 98.6°F

## 2019-10-09 DIAGNOSIS — Z09 Encounter for follow-up examination after completed treatment for conditions other than malignant neoplasm: Secondary | ICD-10-CM

## 2019-10-09 DIAGNOSIS — Z981 Arthrodesis status: Secondary | ICD-10-CM

## 2019-10-09 DIAGNOSIS — M62838 Other muscle spasm: Secondary | ICD-10-CM

## 2019-10-09 DIAGNOSIS — Z4789 Encounter for other orthopedic aftercare: Secondary | ICD-10-CM

## 2019-10-09 MED ORDER — BACLOFEN 5 MG PO TABS
1.0000 | ORAL_TABLET | Freq: Three times a day (TID) | ORAL | 0 refills | Status: DC | PRN
Start: 2019-10-09 — End: 2019-11-06

## 2019-10-09 NOTE — Progress Notes (Signed)
[facility] [place] [facility]  Orthopaedic Spine - Clinic Note    PRIMARY CARE PROVIDER:   [name]    REASON FOR VISIT: Thoracolumbar Spine postoperative evaluation    DATE OF SURGERY: 09/06/19    PROCEDURE: XLIF L4/5 and L5/S1, PSIF L4-S1     Subjective:     Stephen [name] is a 68 year old male who presents to clinic for postoperative evaluation of above. The patient is doing well overall. He does have some muscular paraspinal back pain. He is using a cane to ambulate. He states that [place] and [place] nursing have been excellent, and he is interested in [place]. He has been wearing his LSO brace without issue. He denies leg pain, weakness, numbness, tingling. He denies fever/chills.    Interval History: patient states he has increased back pain and left sided buttocks and leg pain with change in positions and getting out of bed and up from the bathroom. No numbness or tingling down the leg.    Physical Examination:     Vital signs reviewed BP 111/70   Pulse 73   Temp 98.6 F (37 C) (Temporal)   Psychiatric: Alert and oriented to person, place, and date. Normal affect.  General Appearance: healthy, alert, no distress, pleasant affect, cooperative.  HEENT: NC/AT, EOMI, sclera anicteric    CARDIO: RRR  PULM: NLB  ABD: ND  NEURO/MUSCULOSKELETAL:     Lumbar Spine Exam  General: Dressings c/d/i. Posterior back incision healing well with no erythema, fluctuance, or drainage. Distal most aspect of incision with slight delay in healing, but no evidence of infection. Lateral incision c/d/i.    Lower Extremity Motor Strength    Muscle Left/5 Right/5 Comment   Hip flexion (L2) 5 5    Knee extension (L3) 5 5    Tibialis anterior (L4) 5 5    Extensor hallucis (L5) 5 5    gastrocsoleus (S1) 5 5      Lower Extremity Sensation to Light Touch  Nerve Left Right Comment   L2 Intact Intact    L3 Intact Intact    L4 Intact Intact    L5 Intact Intact    S1 Intact Intact      Special  Testing    Patellar Reflex:  2+ L, 2+ R  Achilles Reflex:  2+ L, 2+ R  Clonus    negative  Straight Leg Raise  Deferred due to post-op state  Babinski   downgoing        Imaging Studies:     X-ray Lumbosacral Spine 2 Or 3 Views    Result Date: 09/23/2019  IMPRESSION: Redemonstration of posterior instrumented fixation and interbody grafts from L3-L5 without interval hardware complication/failure. Interbody grafts are not yet incorporated.  Other likely chronic findings are not significantly changed.    X-ray Lumbosacral Spine 2 Or 3 Views    Result Date: 09/08/2019  IMPRESSION: Satisfactory immediate postop alignment from L3 through L5 fixation/fusion.    Assessment:   No diagnosis found.  Plan:    Patient with a healed incision. Will switch the Tizanidine to Baclofen. Will start Tylenol with Oxycodone. Will increase Gabapentin at night. Reassured the patient of his progress.   We discussed the standard postoperative course and reviewed possible signs of complications.  We recommended continuing with LSO brace, HHOT, [place] nursing.  All the patient's questions were answered today in clinic.    Follow-up: 4 weeks

## 2019-10-09 NOTE — Patient Instructions (Addendum)
Please take Tylenol along with your dose of Oxycodone. Do not exceed 3 G of tylenol per day.  Please stop Tizanidine and start Baclofen.   Please increase Gabapentin to 300mg  twice daily and 600mg  at night

## 2019-10-10 ENCOUNTER — Ambulatory Visit (HOSPITAL_BASED_OUTPATIENT_CLINIC_OR_DEPARTMENT_OTHER): Payer: Medicare Other | Admitting: Nurse Practitioner

## 2019-10-14 ENCOUNTER — Other Ambulatory Visit: Payer: Self-pay

## 2019-10-22 ENCOUNTER — Telehealth (HOSPITAL_BASED_OUTPATIENT_CLINIC_OR_DEPARTMENT_OTHER): Payer: Self-pay | Admitting: Nurse Practitioner

## 2019-10-22 DIAGNOSIS — Z981 Arthrodesis status: Secondary | ICD-10-CM

## 2019-10-22 MED ORDER — ACETAMINOPHEN 325 MG PO TABS
650.0000 mg | ORAL_TABLET | Freq: Four times a day (QID) | ORAL | 0 refills | Status: DC | PRN
Start: 2019-10-22 — End: 2020-07-10

## 2019-10-22 MED ORDER — OXYCODONE HCL 5 MG OR TABS
5.0000 mg | ORAL_TABLET | Freq: Four times a day (QID) | ORAL | 0 refills | Status: DC | PRN
Start: 2019-10-22 — End: 2019-11-06

## 2019-10-22 NOTE — Telephone Encounter (Signed)
Rn called pt, Pt having same pain was having previously, small spot in low back about the size of quarter and radiates. Pain is constant.   Baclofen, and gabapentin are only meds left  Pt has been out of pain meds for 3 days.     Pt is icing intermittently throughout day.   Discussed positioning to help with pain.   Pt denies new sy, no b/b issues.       Will route to provider to advise further for

## 2019-10-22 NOTE — Telephone Encounter (Signed)
Advised pt of new Rx. He v/u.

## 2019-10-22 NOTE — Telephone Encounter (Signed)
Ordered 5 mg Oxy and Tylenol

## 2019-10-22 NOTE — Telephone Encounter (Signed)
Stephen [name] 68 year old   ESTABLISHED PAT ONLY   Reason for call having pain, patient reports to have pain level 10/10. Patient feels this isn't normal. Patient having a difficult time sleeping. Patient would like something for pain.      Provider Name: Dr. Lelon Huh      If  Patient had surgery:FUSION, SPINE, LUMBAR, extreme lateral interbody fusion, 2 LEVELS, L3 through L5 09/06/2019      LOV Plan: 10/09/2019    Plan:    Patient with a healed incision. Will switch the Tizanidine to Baclofen. Will start Tylenol with Oxycodone. Will increase Gabapentin at night. Reassured the patient of his progress.   We discussed the standard postoperative course and reviewed possible signs of complications.  We recommended continuing with LSO brace, HHOT, [place] nursing.  All the patient's questions were answered today in clinic.    Follow-up: 4 weeks      Best time to call patient:  [phone]        Reminder:  Symptom calls should be responded to within 2 hours by RN. If you need a more urgent callback to patient, please use RN chat group and/or page. Use messaging guidelines for red-flag symptoms. Please verify that your patient has received callback with-in the requested timeframe.

## 2019-10-30 ENCOUNTER — Other Ambulatory Visit: Payer: Self-pay

## 2019-11-01 ENCOUNTER — Telehealth: Payer: Self-pay

## 2019-11-01 ENCOUNTER — Other Ambulatory Visit
Admission: RE | Admit: 2019-11-01 | Discharge: 2019-11-01 | Disposition: A | Discharge status: Home / Self Care | Payer: Managed Care, Other (non HMO) | Source: Ambulatory Visit | Attending: Physician Assistant | Admitting: Physician Assistant

## 2019-11-01 DIAGNOSIS — E7800 Pure hypercholesterolemia, unspecified: Secondary | ICD-10-CM

## 2019-11-01 DIAGNOSIS — I2510 Atherosclerotic heart disease of native coronary artery without angina pectoris: Secondary | ICD-10-CM

## 2019-11-01 LAB — LIPID PANEL
Cholesterol: 164 mg/dL (ref 0–200)
HDL: 55 mg/dL (ref >40)
LDL Cholesterol: 86 mg/dL (ref 0–99)
Total CHOL/HDL Ratio: 3 RATIO
Triglycerides: 113 mg/dL (ref <150)
VLDL: 23 mg/dL (ref 0–40)

## 2019-11-01 LAB — HEPATIC FUNCTION PANEL
ALT: 24 U/L (ref 0–44)
AST: 22 U/L (ref 15–41)
Albumin: 4 g/dL (ref 3.5–5.0)
Alkaline Phosphatase: 81 U/L (ref 38–126)
Bilirubin, Direct: <0.1 mg/dL (ref 0.0–0.2)
Total Bilirubin: 0.9 mg/dL (ref 0.3–1.2)
Total Protein: 7.7 g/dL (ref 6.5–8.1)

## 2019-11-01 MED ORDER — ATORVASTATIN CALCIUM 80 MG PO TABS: 80.0000 mg | ORAL_TABLET | Freq: Every day | ORAL | 3 refills | Status: DC

## 2019-11-01 NOTE — Telephone Encounter (Signed)
Call to patient to discuss results and POC from Christell Faith, [place].  Rx updated and blood ordered.   Advised pt to call for any further questions or concerns.

## 2019-11-01 NOTE — Telephone Encounter (Signed)
-----   Message from Rise Mu, PA-C sent at 11/01/2019 11:41 AM EST ----- Liver function normal. LDL remains above goal of 70. Please increase atorvastatin to 80 mg daily. Schedule fasting lipid panel and liver function approximately 3 months after the titration of atorvastatin.

## 2019-11-05 ENCOUNTER — Encounter (INDEPENDENT_AMBULATORY_CARE_PROVIDER_SITE_OTHER): Payer: Self-pay | Admitting: Orthopaedic Surgery of the Spine

## 2019-11-05 DIAGNOSIS — Z981 Arthrodesis status: Secondary | ICD-10-CM

## 2019-11-06 ENCOUNTER — Inpatient Hospital Stay (INDEPENDENT_AMBULATORY_CARE_PROVIDER_SITE_OTHER): Admit: 2019-11-06 | Discharge: 2019-11-06 | Disposition: A | Discharge status: Home Health | Payer: Medicare Other

## 2019-11-06 ENCOUNTER — Other Ambulatory Visit
Admission: RE | Admit: 2019-11-06 | Discharge: 2019-11-06 | Disposition: A | Discharge status: Home / Self Care | Payer: Medicare Other | Attending: Orthopaedic Surgery of the Spine | Admitting: Orthopaedic Surgery of the Spine

## 2019-11-06 ENCOUNTER — Ambulatory Visit (INDEPENDENT_AMBULATORY_CARE_PROVIDER_SITE_OTHER): Payer: Medicare Other | Admitting: Orthopaedic Surgery of the Spine

## 2019-11-06 VITALS — BP 115/69 | HR 50 | Temp 98.4°F | Ht 67.0 in | Wt 207.9 lb

## 2019-11-06 DIAGNOSIS — M5136 Other intervertebral disc degeneration, lumbar region: Secondary | ICD-10-CM

## 2019-11-06 DIAGNOSIS — Z09 Encounter for follow-up examination after completed treatment for conditions other than malignant neoplasm: Secondary | ICD-10-CM

## 2019-11-06 DIAGNOSIS — Z981 Arthrodesis status: Secondary | ICD-10-CM

## 2019-11-06 DIAGNOSIS — M5134 Other intervertebral disc degeneration, thoracic region: Secondary | ICD-10-CM

## 2019-11-06 DIAGNOSIS — D649 Anemia, unspecified: Secondary | ICD-10-CM

## 2019-11-06 DIAGNOSIS — M62838 Other muscle spasm: Secondary | ICD-10-CM

## 2019-11-06 LAB — CBC WITH DIFF, BLOOD
ANC-Automated: 3.8 10*3/uL (ref 1.6–7.0)
Abs Basophils: 0.1 10*3/uL
Abs Eosinophils: 0.1 10*3/uL (ref 0.0–0.5)
Abs Lymphs: 2.3 10*3/uL (ref 0.8–3.1)
Abs Monos: 0.6 10*3/uL (ref 0.2–0.8)
Basophils: 1 %
Eosinophils: 1 %
Hct: 33.3 % — ABNORMAL LOW (ref 40.0–50.0)
Hgb: 10 gm/dL — ABNORMAL LOW (ref 13.7–17.5)
Lymphocytes: 34 %
MCH: 25.3 pg — ABNORMAL LOW (ref 26.0–32.0)
MCHC: 30 g/dL — ABNORMAL LOW (ref 32.0–36.0)
MCV: 84.3 um3 (ref 79.0–95.0)
MPV: 10.6 fL (ref 9.4–12.4)
Monocytes: 8 %
Plt Count: 275 10*3/uL (ref 140–370)
RBC: 3.95 10*6/uL — ABNORMAL LOW (ref 4.60–6.10)
RDW: 15.9 % — ABNORMAL HIGH (ref 12.0–14.0)
Segs: 56 %
WBC: 6.8 10*3/uL (ref 4.0–10.0)

## 2019-11-06 MED ORDER — BACLOFEN 5 MG PO TABS
1.0000 | ORAL_TABLET | Freq: Three times a day (TID) | ORAL | 0 refills | Status: DC | PRN
Start: 2019-11-06 — End: 2020-05-06

## 2019-11-06 MED ORDER — OXYCODONE HCL 5 MG OR TABS
5.0000 mg | ORAL_TABLET | Freq: Three times a day (TID) | ORAL | 0 refills | Status: DC | PRN
Start: 2019-11-06 — End: 2020-02-14

## 2019-11-06 MED ORDER — GABAPENTIN 300 MG OR CAPS
300.0000 mg | ORAL_CAPSULE | Freq: Three times a day (TID) | ORAL | 0 refills | Status: DC
Start: 2019-11-06 — End: 2019-12-04

## 2019-11-18 NOTE — Progress Notes (Signed)
Attending Note:    Subjective:  I reviewed the history.  Patient interviewed and examined.  History of present illness (HPI):  Post-op Visit     Review of Systems (ROS): As per  the resident's note.  Past Medical, Family, Social History:  As per  the resident's  note.    Objective:   I have examined the patient and I concur with the resident's exam.    Assessment and plan reviewed with the resident physician.  I agree with the resident's plan as documented.    See the resident's note for further details.

## 2019-12-04 ENCOUNTER — Other Ambulatory Visit (HOSPITAL_BASED_OUTPATIENT_CLINIC_OR_DEPARTMENT_OTHER): Payer: Self-pay | Admitting: Orthopaedic Surgery of the Spine

## 2019-12-04 DIAGNOSIS — Z981 Arthrodesis status: Secondary | ICD-10-CM

## 2019-12-04 MED ORDER — GABAPENTIN 300 MG OR CAPS
300.0000 mg | ORAL_CAPSULE | Freq: Three times a day (TID) | ORAL | 0 refills | Status: DC
Start: 2019-12-04 — End: 2020-05-06

## 2019-12-04 NOTE — Telephone Encounter (Signed)
Received rf req for gabapentin 300 mg  Last rf: 11/07/2019  Last OV: 11/06/2019

## 2019-12-16 ENCOUNTER — Encounter (INDEPENDENT_AMBULATORY_CARE_PROVIDER_SITE_OTHER): Payer: Self-pay | Admitting: Orthopaedic Surgery of the Spine

## 2019-12-16 DIAGNOSIS — Z981 Arthrodesis status: Secondary | ICD-10-CM

## 2019-12-18 ENCOUNTER — Ambulatory Visit (INDEPENDENT_AMBULATORY_CARE_PROVIDER_SITE_OTHER): Payer: Medicare Other | Admitting: Orthopaedic Surgery of the Spine

## 2019-12-18 ENCOUNTER — Inpatient Hospital Stay (INDEPENDENT_AMBULATORY_CARE_PROVIDER_SITE_OTHER): Admit: 2019-12-18 | Discharge: 2019-12-18 | Disposition: A | Discharge status: Home Health | Payer: Medicare Other

## 2019-12-18 ENCOUNTER — Encounter (INDEPENDENT_AMBULATORY_CARE_PROVIDER_SITE_OTHER): Payer: Self-pay | Admitting: Orthopaedic Surgery of the Spine

## 2019-12-18 VITALS — BP 109/61 | HR 47 | Temp 97.8°F | Ht 67.0 in | Wt 202.0 lb

## 2019-12-18 DIAGNOSIS — Z981 Arthrodesis status: Secondary | ICD-10-CM

## 2019-12-18 DIAGNOSIS — Z4789 Encounter for other orthopedic aftercare: Secondary | ICD-10-CM

## 2019-12-18 MED ORDER — FERROUS SULFATE 325 (65 FE) MG OR TABS
325.0000 mg | ORAL_TABLET | Freq: Three times a day (TID) | ORAL | Status: DC
Start: ? — End: 2020-07-29

## 2019-12-18 MED ORDER — BENAZEPRIL-HYDROCHLOROTHIAZIDE 20-25 MG OR TABS
1.0000 | ORAL_TABLET | Freq: Every day | ORAL | Status: DC
Start: ? — End: 2020-07-29

## 2019-12-18 NOTE — Progress Notes (Signed)
Orthopaedics Spine Clinic Evaluation  12/18/2019    Patient ID:  Name: Akida Urman  MRN: [id number]  DOB: [birth year]    Reason for Visit: Follow-up.    DATE OF SURGERY: 09/06/19    PROCEDURE: XLIF L4/5 and L5/S1, PSIF L4-S1     HPI:   Secundino Haldane is a 68 year old male who presents to clinic for follow-up evaluation ~3.5 months from the above procedures. He continues to take gabapentin for pain but is no longer taking narcotic pain medication. He reports left buttocks pain that is almost always present. There is some radiation into his left lateral thigh but his most significant complaint is burning calf pain left greater than right. The pain is sometimes worse with activity but is not necessarily better with rest. Sometimes his calves bother him when is laying down as well. He also reports aggregating his low back while lifting something recently. He started outpatient physical therapy recently and continues with this. He continues to uses a bone stimulator. He also reports feeling colder than usual and states he continues to have anemia with Hgb 10. He recently started iron supplementation with his PCP.    Past medical history, past surgical history, social history, family history, allergies, and medications are unchanged from prior visit.    Physical Examination:   BP 109/61 (BP Location: Left arm, BP Patient Position: Sitting, BP cuff size: Regular)   Pulse (!) 47   Temp 97.8 F (36.6 C) (Temporal)   Ht 5\' 7"  (1.702 m)   Wt 91.6 kg (202 lb)   SpO2 98%   BMI 31.64 kg/m     General / Psychiatric: healthy, no distress, cooperative, AOx3, normal affect, appropriate  Cardio: RRR per peripheral pulses  Pulmonary: breathing quietly without use of accessory muscles  Neck/Back: Posterior and lateral incision well healed without surrounding erythema or active drainage    Bilateral Lower Extremity   Motor:       Right           Left        Iliopsoas (L2/3)                     5/5           5/5          Quadriceps (L4)                    5/5           5/5        Tibialis Anterior (L4/5)           5/5           5/5        EHL (L5)                               5/5           5/5        GSC (S1)                              5/5           5/5   Sensation:        SILT in L2-S1 distributions.   Vascular:        Toes WWP.    Imaging:   Radiographs of the  lumbar spine, AP and lateral, were obtained today 12/18/2019 and reviewed personally in clinic which demonstrate intact hardware and maintained alignment.      Assessment & Plan:  68 year old male presenting for ~3.5 month follow-up evaluation of XLIF L4/5 and L5/S1, PSIF L4-S1. He presents with symptoms consistent with aggravating his low back and left piriformis syndrome. Base on his most recent laboratory results his hemoglobin has been up-trending to 10 from 7.5 post-operatively.     - Continue outpatient physical therapy and expand focus to include left piriformis syndrome  - Continue gabapentin and use anti-inflammatories as needed for low back aggravation.   - Can come out of lumbar brace and use as needed     FOLLOWUP: Return to clinic after 3 months with XR lumbar spine, AP and lateral (order placed). The patient is encouraged to call us with any questions or problems in the interim. Our contact numbers were given to the patient.    Ane Payment, MD  Orthopedic Spine Fellow

## 2019-12-19 NOTE — Progress Notes (Signed)
[facility] [place] [facility]  Orthopaedic Spine - Clinic Note    PRIMARY CARE PROVIDER:   [name]    REASON FOR VISIT: Thoracolumbar Spine postoperative evaluation    DATE OF SURGERY: 09/06/19    PROCEDURE: XLIF L4/5 and L5/S1, PSIF L4-S1     Subjective:     Stephen [name] is a 68 year old male who presents to clinic for postoperative evaluation of above. The patient is doing well overall. He does have some muscular paraspinal back pain. He is using a cane to ambulate. He states that [place] and [place] nursing have been excellent, and he is interested in [place]. He has been wearing his LSO brace without issue. He denies leg pain, weakness, numbness, tingling. He denies fever/chills.    Physical Examination:     Vital signs reviewed BP 115/69 (BP Location: Left arm, BP Patient Position: Sitting, BP cuff size: Regular)   Pulse 50   Temp 98.4 F (36.9 C) (Temporal)   Ht 5\' 7"  (1.702 m)   Wt 94.3 kg (207 lb 14.3 oz)   BMI 32.56 kg/m   Psychiatric: Alert and oriented to person, place, and date. Normal affect.  General Appearance: healthy, alert, no distress, pleasant affect, cooperative.  HEENT: NC/AT, EOMI, sclera anicteric    CARDIO: RRR  PULM: NLB  ABD: ND  NEURO/MUSCULOSKELETAL:     Lumbar Spine Exam  General: Dressings c/d/i. Posterior back incision healing well with no erythema, fluctuance, or drainage. Distal most aspect of incision with slight delay in healing, but no evidence of infection. Lateral incision c/d/i.    Lower Extremity Motor Strength    Muscle Left/5 Right/5 Comment   Hip flexion (L2) 5 5    Knee extension (L3) 5 5    Tibialis anterior (L4) 5 5    Extensor hallucis (L5) 5 5    gastrocsoleus (S1) 5 5      Lower Extremity Sensation to Light Touch  Nerve Left Right Comment   L2 Intact Intact    L3 Intact Intact    L4 Intact Intact    L5 Intact Intact    S1 Intact Intact      Special Testing    Patellar Reflex:  2+ L, 2+ R  Achilles Reflex:  2+ L, 2+  R  Clonus    negative  Straight Leg Raise  Deferred due to post-op state  Babinski   downgoing        Imaging Studies:     X-ray Lumbosacral Spine 2 Or 3 Views    Result Date: 09/23/2019  IMPRESSION: Redemonstration of posterior instrumented fixation and interbody grafts from L3-L5 without interval hardware complication/failure. Interbody grafts are not yet incorporated.  Other likely chronic findings are not significantly changed.    X-ray Lumbosacral Spine 2 Or 3 Views    Result Date: 09/08/2019  IMPRESSION: Satisfactory immediate postop alignment from L3 through L5 fixation/fusion.    Assessment:     ICD-10-CM ICD-9-CM    1. S/P lumbar fusion  Z98.1 V45.4 Physical Therapy - Outside (Non-[place])      CBC w/ Diff [place]      oxyCODONE (ROXICODONE) 5 MG immediate release tablet      DISCONTINUED: gabapentin (NEURONTIN) 300 MG capsule   2. Anemia, unspecified type  D64.9 285.9 CBC w/ Diff [place]   3. Muscle spasms of both lower extremities  M62.838 728.85 Baclofen 5 MG TABS     Plan:  Reviewed clinical history as well as exam results and imaging findings with patient in detail. He has had interval progression of incision healing. We discussed ongoing wound care. Will plan for referral to PT.     All the patient's questions were answered today in clinic.    Follow-up: 6 weeks

## 2020-01-02 ENCOUNTER — Encounter: Payer: Self-pay | Admitting: Hospital

## 2020-01-18 NOTE — Progress Notes (Signed)
Attending Note:    Subjective:  I reviewed the history.  Patient interviewed and examined.  History of present illness (HPI):  Post-op Visit (pain 8/10 lower back)     Review of Systems (ROS): As per the fellow's note.  Past Medical, Family, Social History:  As per the fellow's  note.    Objective:   I have examined the patient and I concur with the fellow's exam.  Assessment and plan reviewed with the fellow. I agree with the fellow's plan as documented.  See the fellow's note for further details.

## 2020-01-20 ENCOUNTER — Other Ambulatory Visit: Payer: Self-pay | Admitting: Family Medicine

## 2020-01-27 ENCOUNTER — Encounter (INDEPENDENT_AMBULATORY_CARE_PROVIDER_SITE_OTHER): Payer: Self-pay

## 2020-01-27 ENCOUNTER — Encounter (INDEPENDENT_AMBULATORY_CARE_PROVIDER_SITE_OTHER): Payer: Self-pay | Admitting: Hospital

## 2020-02-04 ENCOUNTER — Encounter (INDEPENDENT_AMBULATORY_CARE_PROVIDER_SITE_OTHER): Payer: Self-pay | Admitting: Hospital

## 2020-02-12 ENCOUNTER — Other Ambulatory Visit: Payer: Self-pay

## 2020-02-14 ENCOUNTER — Telehealth (HOSPITAL_BASED_OUTPATIENT_CLINIC_OR_DEPARTMENT_OTHER): Payer: Self-pay | Admitting: Orthopaedic Surgery of the Spine

## 2020-02-14 DIAGNOSIS — Z981 Arthrodesis status: Secondary | ICD-10-CM

## 2020-02-14 MED ORDER — OXYCODONE HCL 5 MG OR TABS
5.0000 mg | ORAL_TABLET | Freq: Three times a day (TID) | ORAL | 0 refills | Status: DC | PRN
Start: 2020-02-14 — End: 2020-05-06

## 2020-02-14 NOTE — Telephone Encounter (Signed)
Pt calling to request pain medication. He is still having a lot of lower back pain.  He is scheduled for 02/19/20 f/u, requesting pain medication in the mean time.    [id number] [place] [facility] [place]

## 2020-02-14 NOTE — Telephone Encounter (Signed)
DOS 08/27/19: Lumbar fusion    Pt will have f/u with Dr. Earnest Conroy on 02/19/20.    Left VM to see if he is taking OTC meds for pain. He has not had refill of Oxycodone since 10/2019.

## 2020-02-14 NOTE — Telephone Encounter (Signed)
Eprescribed oxycodone 5mg  every 8 hrs #15.  This should be enough until he sees Dr Zlomislic.  He should resume OTC NSAIDs as well.

## 2020-02-14 NOTE — Telephone Encounter (Signed)
Pt states that he is not using any OTC pain meds, he is currently using Gabapentin. He is currently taking 300mg  3 times daily.

## 2020-02-14 NOTE — Telephone Encounter (Addendum)
I have left message : This should be enough until he sees Dr Zlomislic.  He should resume OTC NSAIDs as well.

## 2020-02-18 ENCOUNTER — Encounter: Payer: Self-pay | Admitting: *Deleted

## 2020-02-18 ENCOUNTER — Telehealth: Payer: Self-pay | Admitting: *Deleted

## 2020-02-18 ENCOUNTER — Encounter (INDEPENDENT_AMBULATORY_CARE_PROVIDER_SITE_OTHER): Payer: Self-pay | Admitting: Orthopaedic Surgery of the Spine

## 2020-02-18 ENCOUNTER — Telehealth (INDEPENDENT_AMBULATORY_CARE_PROVIDER_SITE_OTHER): Payer: Self-pay | Admitting: Orthopaedic Surgery of the Spine

## 2020-02-18 DIAGNOSIS — Z87891 Personal history of nicotine dependence: Secondary | ICD-10-CM

## 2020-02-18 DIAGNOSIS — Z981 Arthrodesis status: Secondary | ICD-10-CM

## 2020-02-18 NOTE — Telephone Encounter (Signed)
Left message for patient to notify them that it is time to schedule annual low dose lung cancer screening CT scan. Instructed patient to call back to verify information prior to the scan being scheduled.  

## 2020-02-18 NOTE — Telephone Encounter (Signed)
Tried to call patient regarding door issue at [employer]. Mailbox full unable to LVM.

## 2020-02-19 ENCOUNTER — Ambulatory Visit (INDEPENDENT_AMBULATORY_CARE_PROVIDER_SITE_OTHER): Payer: Medicare Other | Admitting: Orthopaedic Surgery of the Spine

## 2020-02-19 ENCOUNTER — Encounter (INDEPENDENT_AMBULATORY_CARE_PROVIDER_SITE_OTHER): Payer: Self-pay | Admitting: Orthopaedic Surgery of the Spine

## 2020-02-19 ENCOUNTER — Telehealth (INDEPENDENT_AMBULATORY_CARE_PROVIDER_SITE_OTHER): Payer: Self-pay | Admitting: Orthopaedic Surgery of the Spine

## 2020-02-19 ENCOUNTER — Inpatient Hospital Stay (INDEPENDENT_AMBULATORY_CARE_PROVIDER_SITE_OTHER): Admit: 2020-02-19 | Discharge: 2020-02-19 | Disposition: A | Discharge status: Home Health | Payer: Medicare Other

## 2020-02-19 VITALS — BP 123/74 | HR 58 | Temp 97.6°F | Ht 67.0 in | Wt 202.0 lb

## 2020-02-19 DIAGNOSIS — G8929 Other chronic pain: Secondary | ICD-10-CM

## 2020-02-19 DIAGNOSIS — M533 Sacrococcygeal disorders, not elsewhere classified: Secondary | ICD-10-CM

## 2020-02-19 DIAGNOSIS — Z981 Arthrodesis status: Secondary | ICD-10-CM

## 2020-02-19 DIAGNOSIS — M5416 Radiculopathy, lumbar region: Secondary | ICD-10-CM

## 2020-02-19 DIAGNOSIS — M7989 Other specified soft tissue disorders: Secondary | ICD-10-CM

## 2020-02-19 NOTE — Telephone Encounter (Signed)
Attempted to reach patient, he did not answer and mail box if full.  Wanted to let him know Dr. Earnest Conroy is running behind.

## 2020-02-26 NOTE — Telephone Encounter (Signed)
Patient has been notified that annual lung cancer screening low dose CT scan is due currently or will be in near future. Confirmed that patient is within the age range of 55-77, and asymptomatic, (no signs or symptoms of lung cancer). Patient denies illness that would prevent curative treatment for lung cancer if found. Verified smoking history, (former, quit 2012, 42 pack year). The shared decision making visit was done 02/07/17. Patient is agreeable for CT scan being scheduled.

## 2020-02-27 ENCOUNTER — Telehealth (HOSPITAL_BASED_OUTPATIENT_CLINIC_OR_DEPARTMENT_OTHER): Payer: Self-pay | Admitting: Orthopaedic Surgery of the Spine

## 2020-02-27 NOTE — Telephone Encounter (Signed)
Pt has 2 order for injections.  Case Request: PAIN INJECTION, SACROILIAC JOINT [SUR1] (Order [id number]      Case Request: PAIN ESI LUMBAR TRANSFORAMINAL [SUR1] (Order [id number]    Pt wants to know witch one should he have done first. Or both at the same time.     No dictation on file 02/19/20 visit

## 2020-02-27 NOTE — Telephone Encounter (Signed)
Patient called, message left.   Message sent to provider, will call him back.

## 2020-02-27 NOTE — Telephone Encounter (Signed)
Can not be done at the same time usually. He can do Si injections first then TFESI if the pain still persists

## 2020-02-28 ENCOUNTER — Other Ambulatory Visit: Payer: Self-pay

## 2020-02-28 ENCOUNTER — Ambulatory Visit
Admission: RE | Admit: 2020-02-28 | Discharge: 2020-02-28 | Disposition: A | Discharge status: Home / Self Care | Payer: Managed Care, Other (non HMO) | Source: Ambulatory Visit | Attending: Oncology | Admitting: Oncology

## 2020-02-28 DIAGNOSIS — Z87891 Personal history of nicotine dependence: Secondary | ICD-10-CM | POA: Diagnosis present

## 2020-03-02 NOTE — Telephone Encounter (Signed)
Patient called, advised he can start with getting the SI joint injection first.

## 2020-03-03 ENCOUNTER — Other Ambulatory Visit: Payer: Self-pay

## 2020-03-03 ENCOUNTER — Other Ambulatory Visit (INDEPENDENT_AMBULATORY_CARE_PROVIDER_SITE_OTHER): Payer: Managed Care, Other (non HMO)

## 2020-03-03 ENCOUNTER — Other Ambulatory Visit: Payer: Self-pay | Admitting: Family Medicine

## 2020-03-03 ENCOUNTER — Encounter: Payer: Self-pay | Admitting: *Deleted

## 2020-03-03 DIAGNOSIS — R7303 Prediabetes: Secondary | ICD-10-CM

## 2020-03-03 DIAGNOSIS — E7800 Pure hypercholesterolemia, unspecified: Secondary | ICD-10-CM

## 2020-03-03 DIAGNOSIS — Z125 Encounter for screening for malignant neoplasm of prostate: Secondary | ICD-10-CM

## 2020-03-03 LAB — COMPREHENSIVE METABOLIC PANEL
ALT: 48 U/L (ref 0–53)
AST: 35 U/L (ref 0–37)
Albumin: 3.8 g/dL (ref 3.5–5.2)
Alkaline Phosphatase: 229 U/L — ABNORMAL HIGH (ref 39–117)
BUN: 16 mg/dL (ref 6–23)
CO2: 30 mEq/L (ref 19–32)
Calcium: 9.1 mg/dL (ref 8.4–10.5)
Chloride: 101 mEq/L (ref 96–112)
Creatinine, Ser: 1.18 mg/dL (ref 0.40–1.50)
GFR: 61.71 mL/min (ref >60.00)
Glucose, Bld: 94 mg/dL (ref 70–99)
Potassium: 4.5 mEq/L (ref 3.5–5.1)
Sodium: 138 mEq/L (ref 135–145)
Total Bilirubin: 0.5 mg/dL (ref 0.2–1.2)
Total Protein: 6.8 g/dL (ref 6.0–8.3)

## 2020-03-03 LAB — PSA: PSA: 1.89 ng/mL (ref 0.10–4.00)

## 2020-03-03 LAB — LIPID PANEL
Cholesterol: 142 mg/dL (ref 0–200)
HDL: 57 mg/dL (ref >39.00)
LDL Cholesterol: 66 mg/dL (ref 0–99)
NonHDL: 84.63
Total CHOL/HDL Ratio: 2
Triglycerides: 94 mg/dL (ref 0.0–149.0)
VLDL: 18.8 mg/dL (ref 0.0–40.0)

## 2020-03-03 LAB — HEMOGLOBIN A1C: Hgb A1c MFr Bld: 6 % (ref 4.6–6.5)

## 2020-03-06 ENCOUNTER — Telehealth: Payer: Self-pay

## 2020-03-06 ENCOUNTER — Encounter: Payer: Self-pay | Admitting: Family Medicine

## 2020-03-06 ENCOUNTER — Other Ambulatory Visit: Payer: Self-pay

## 2020-03-06 ENCOUNTER — Ambulatory Visit (INDEPENDENT_AMBULATORY_CARE_PROVIDER_SITE_OTHER): Payer: Managed Care, Other (non HMO) | Admitting: Family Medicine

## 2020-03-06 VITALS — BP 124/62 | HR 62 | Temp 97.4°F | Ht 68.0 in | Wt 235.6 lb

## 2020-03-06 DIAGNOSIS — Z1211 Encounter for screening for malignant neoplasm of colon: Secondary | ICD-10-CM | POA: Diagnosis not present

## 2020-03-06 DIAGNOSIS — E7800 Pure hypercholesterolemia, unspecified: Secondary | ICD-10-CM | POA: Diagnosis not present

## 2020-03-06 DIAGNOSIS — R748 Abnormal levels of other serum enzymes: Secondary | ICD-10-CM

## 2020-03-06 DIAGNOSIS — Z87891 Personal history of nicotine dependence: Secondary | ICD-10-CM

## 2020-03-06 DIAGNOSIS — Z0000 Encounter for general adult medical examination without abnormal findings: Secondary | ICD-10-CM

## 2020-03-06 DIAGNOSIS — I2510 Atherosclerotic heart disease of native coronary artery without angina pectoris: Secondary | ICD-10-CM

## 2020-03-06 DIAGNOSIS — I700 Atherosclerosis of aorta: Secondary | ICD-10-CM

## 2020-03-06 DIAGNOSIS — Z6835 Body mass index (BMI) 35.0-35.9, adult: Secondary | ICD-10-CM

## 2020-03-06 DIAGNOSIS — K760 Fatty (change of) liver, not elsewhere classified: Secondary | ICD-10-CM

## 2020-03-06 DIAGNOSIS — J432 Centrilobular emphysema: Secondary | ICD-10-CM

## 2020-03-06 DIAGNOSIS — I351 Nonrheumatic aortic (valve) insufficiency: Secondary | ICD-10-CM

## 2020-03-06 DIAGNOSIS — R7303 Prediabetes: Secondary | ICD-10-CM

## 2020-03-06 NOTE — Telephone Encounter (Signed)
[place] Night - Client Nonclinical Telephone Record AccessNurse Client [place] Night - Client Client Site [place] Physician Ria Bush - MD Contact Type Call Who Is Calling Patient / Member / Family / Caregiver Caller Name Lavor Sacha Caller Phone Number [phone] Patient Name Johnny Holland Patient DOB 1951/05/15 Call Type Message Only Information Provided Reason for Call Request for General Office Information Initial Comment Caller states he is calling to confirm his appointment for 3pm tomorrow. Additional Comment Provided office hours Disp. Time Disposition Final User 03/05/2020 5:57:56 PM General Information Provided Yes Rulon Eisenmenger Call Closed By: Rulon Eisenmenger Transaction Date/Time: 03/05/2020 5:55:42 PM (ET)

## 2020-03-06 NOTE — Assessment & Plan Note (Signed)
Continue to encourage healthy diet and lifestyle choices.  

## 2020-03-06 NOTE — Assessment & Plan Note (Signed)
Chronic, improved on atorvastatin - continue. The 10-year ASCVD risk score Mikey Bussing DC Brooke Bonito., et al., 2013) is: 9.7%   Values used to calculate the score:     Age: 69 years     Sex: Male     Is Non-Hispanic African American: No     Diabetic: No     Tobacco smoker: No     Systolic Blood Pressure: [id number] mmHg     Is BP treated: No     HDL Cholesterol: 57 mg/dL     Total Cholesterol: 142 mg/dL

## 2020-03-06 NOTE — Assessment & Plan Note (Signed)
By CT - continue aspirin and statin.

## 2020-03-06 NOTE — Progress Notes (Signed)
This visit was conducted in person.  BP 124/62 (BP Location: Left Arm, Patient Position: Sitting, Cuff Size: Normal)   Pulse 62   Temp (!) 97.4 F (36.3 C) (Temporal)   Ht '5\' 8"'$  (1.727 m)   Wt 235 lb 9.6 oz (106.9 kg)   SpO2 93%   BMI 35.82 kg/m    CC: CPE Subjective:    Patient ID: Johnny Holland, male    DOB: 09-29-1951, 69 y.o.   MRN: [id number]  HPI: Johnny Holland is a 69 y.o. male presenting on 03/06/2020 for Annual Exam (dental procedure this morning - crown placed. )   Just had dental crown planned 2nd retirement planned in 2 weeks.  Son has been hired to replace him.   Sees cardiology.   Noted elevated ALP - denies abd pain or heartburn or nausea/vomiting or body aches or bone pains.  H/o duodenal ulcer 1980s  Preventative: COLONOSCOPY 11/30/06 diverticulosis; no polyps. iFOB since then - requests repeat Prostate cancer screening -yearly Lung cancer screening -yearly, reassuring Fluvax - declines Td - 2007, Tdap 2018  Prevnar-13 02/2019  Zostavax - 2014  Shingrix - completed 2019 ([facility])  COVID - declines  Advanced directive discussion - has at home, wife is HCPOA. Asked to bring [name] copy.  Seat belt use discussed  Sunscreen use discussed.No suspicious moles.Sees derm regularly.  Ex smoker - quit 2011 prior 1-2 ppd (42+ PY history). Continues vaping up to 2 cartridges/day. Has decided to stop nicotine this year 2021. Will work towards this.  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 Interim medical history  since our last visit reviewed. Allergies and medications reviewed and updated. Outpatient Medications Prior to Visit  Medication Sig Dispense Refill  . albuterol (PROVENTIL HFA;VENTOLIN HFA) 108 (90 Base) MCG/ACT inhaler Inhale 2 puffs into the lungs every 6 (six) hours as needed for wheezing or shortness of breath. 1 Inhaler 0  . aspirin 81 MG tablet Take 1 tablet (81 mg total) by mouth daily.    . Multiple Vitamin (MULTI-VITAMIN DAILY PO) Take by mouth.    Marland Kitchen scopolamine (TRANSDERM-SCOP, 1.5 MG,) 1 MG/3DAYS Place 1 patch (1.5 mg total) onto the skin every 3 (three) days. 4 patch 1  . sildenafil (VIAGRA) 100 MG tablet TAKE ONE-HALF TO ONE TABLET(50 TO '100MG'$  TOTAL) BY MOUTH DAILY AS NEEDED FOR ERECTILE DYSFUNCTION. GENERIC EQUIVALENT FOR VIAGRA 15 tablet 3  . atorvastatin (LIPITOR) 80 MG tablet Take 1 tablet (80 mg total) by mouth daily. 90 tablet 3   No facility-administered medications prior to visit.     Per HPI unless specifically indicated in ROS section below Review of Systems  Constitutional: Negative for activity change, appetite change, chills, fatigue, fever and unexpected weight change.  HENT: Negative for hearing loss.   Eyes: Negative for visual disturbance.  Respiratory: Positive for wheezing (occasional). Negative for cough, chest tightness and shortness of breath.   Cardiovascular: Negative for chest pain, palpitations and leg swelling.  Gastrointestinal: Negative for abdominal distention, abdominal pain, blood in stool, constipation, diarrhea, nausea and vomiting.  Genitourinary: Negative for difficulty urinating and hematuria.  Musculoskeletal: Negative  for arthralgias, myalgias and neck pain.  Skin: Negative for rash.  Neurological: Negative for dizziness, seizures, syncope and headaches.  Hematological: Negative for adenopathy. Does not bruise/bleed easily.  Psychiatric/Behavioral: Negative for dysphoric mood. The patient is not nervous/anxious.    Objective:    BP  124/62 (BP Location: Left Arm, Patient Position: Sitting, Cuff Size: Normal)   Pulse 62   Temp (!) 97.4 F (36.3 C) (Temporal)   Ht '5\' 8"'$  (1.727 m)   Wt 235 lb 9.6 oz (106.9 kg)   SpO2 93%   BMI 35.82 kg/m   Wt Readings from Last 3 Encounters:  03/06/20 235 lb 9.6 oz (106.9 kg)  02/28/20 234 lb (106.1 kg)  09/27/19 240 lb (108.9 kg)    Physical Exam Vitals and nursing note reviewed.  Constitutional:      General: He is not in acute distress.    Appearance: Normal appearance. He is well-developed. He is not ill-appearing.  HENT:     Head: Normocephalic and atraumatic.     Right Ear: Hearing, tympanic membrane, ear canal and external ear normal.     Left Ear: Hearing, tympanic membrane, ear canal and external ear normal.     Mouth/Throat:     Pharynx: Uvula midline.  Eyes:     General: No scleral icterus.    Extraocular Movements: Extraocular movements intact.     Conjunctiva/sclera: Conjunctivae normal.     Pupils: Pupils are equal, round, and reactive to light.  Cardiovascular:     Rate and Rhythm: Normal rate and regular rhythm.     Pulses: Normal pulses.          Radial pulses are 2+ on the right side and 2+ on the left side.     Heart sounds: Murmur (3/6 systolic at USB) present.  Pulmonary:     Effort: Pulmonary effort is normal. No respiratory distress.     Breath sounds: Normal breath sounds. No wheezing, rhonchi or rales.  Abdominal:     General: Abdomen is flat. Bowel sounds are normal. There is no distension.     Palpations: Abdomen is soft. There is no mass.     Tenderness: There is no abdominal tenderness. There is no guarding or rebound.     Hernia: No hernia is present.     Comments: No HSM  Musculoskeletal:        General: Normal range of motion.     Cervical back: Normal range of motion and neck supple.     Right lower leg: No edema.     Left lower leg: No edema.  Lymphadenopathy:     Cervical: No cervical adenopathy.  Skin:    General: Skin is warm  and dry.     Findings: No rash.  Neurological:     General: No focal deficit present.     Mental Status: He is alert and oriented to person, place, and time.     Comments: CN grossly intact, station and gait intact  Psychiatric:        Mood and Affect: Mood normal.        Behavior: Behavior normal.        Thought Content: Thought content normal.        Judgment: Judgment normal.       Results for orders placed or performed in visit on 03/03/20  PSA  Result Value Ref Range   PSA 1.89 0.10 - 4.00 ng/mL  Hemoglobin A1c  Result Value Ref Range   Hgb  A1c MFr Bld 6.0 4.6 - 6.5 %  Comprehensive metabolic panel  Result Value Ref Range   Sodium 138 135 - 145 mEq/L   Potassium 4.5 3.5 - 5.1 mEq/L   Chloride 101 96 - 112 mEq/L   CO2 30 19 - 32 mEq/L   Glucose, Bld 94 70 - 99 mg/dL   BUN 16 6 - 23 mg/dL   Creatinine, Ser 1.18 0.40 - 1.50 mg/dL   Total Bilirubin 0.5 0.2 - 1.2 mg/dL   Alkaline Phosphatase 229 (H) 39 - 117 U/L   AST 35 0 - 37 U/L   ALT 48 0 - 53 U/L   Total Protein 6.8 6.0 - 8.3 g/dL   Albumin 3.8 3.5 - 5.2 g/dL   GFR 61.71 >60.00 mL/min   Calcium 9.1 8.4 - 10.5 mg/dL  Lipid panel  Result Value Ref Range   Cholesterol 142 0 - 200 mg/dL   Triglycerides 94.0 0.0 - 149.0 mg/dL   HDL 57.00 >39.00 mg/dL   VLDL 18.8 0.0 - 40.0 mg/dL   LDL Cholesterol 66 0 - 99 mg/dL   Total CHOL/HDL Ratio 2    NonHDL 84.63    Assessment & Plan:  This visit occurred during the SARS-CoV-2 public health emergency.  Safety protocols were in place, including screening questions prior to the visit, additional usage of staff PPE, and extensive cleaning of exam room while observing appropriate contact time as indicated for disinfecting solutions.   Problem List Items Addressed This Visit    Thoracic aorta atherosclerosis ([place])    By CT - continue aspirin and statin.       Severe obesity (BMI 35.0-35.9 with comorbidity) ([place])    Continue to encourage healthy diet and lifestyle choices.         Prediabetes    Encouraged avoiding added sugars in diet.       Personal history of tobacco use, presenting hazards to health    Undergoing yearly CT. Encouraged full nicotine cessation. He currently vapes daily.       HLD (hyperlipidemia)    Chronic, improved on atorvastatin - continue. The 10-year ASCVD risk score Mikey Bussing DC Brooke Bonito., et al., 2013) is: 9.7%   Values used to calculate the score:     Age: 78 years     Sex: Male     Is Non-Hispanic African American: No     Diabetic: No     Tobacco smoker: No     Systolic Blood Pressure: 706 mmHg     Is BP treated: No     HDL Cholesterol: 57 mg/dL     Total Cholesterol: 142 mg/dL       Hepatic steatosis    H/o this.       Healthcare maintenance - Primary    Preventative protocols reviewed and updated unless pt declined. Discussed healthy diet and lifestyle.       Elevated alkaline phosphatase level    New, significantly elevated - this is a change from 4 months ago. Recheck today (fractionated) along with GGT. Consider updating abdominal imaging.       Relevant Orders   Gamma GT   Alkaline Phosphatase Isoenzymes   COPD (chronic obstructive pulmonary disease) ([place])    Present on CT. Patient asymptomatic.       CAD (coronary artery disease)    Continue aspirin, statin       Aortic regurgitation    Known h/o this, has seen cardiology planned echo Q2 yrs  Other Visit Diagnoses    Special screening for malignant neoplasms, colon       Relevant Orders   Fecal occult blood, imunochemical       No orders of the defined types were placed in this encounter.  Orders Placed This Encounter  Procedures  . Fecal occult blood, imunochemical    Standing Status:   Future    Standing Expiration Date:   03/06/2021  . Gamma GT  . Alkaline Phosphatase Isoenzymes    Patient instructions: Pass by lab to pick up stool kit.  Blood work today.  Congrats on upcoming retirement! Return for welcome to medicare visit when  you get medicare.   Follow up plan: Return if symptoms worsen or fail to improve.  Ria Bush, MD

## 2020-03-06 NOTE — Assessment & Plan Note (Addendum)
New, significantly elevated - this is a change from 4 months ago. Recheck today (fractionated) along with GGT. Consider updating abdominal imaging.

## 2020-03-06 NOTE — Assessment & Plan Note (Signed)
Preventative protocols reviewed and updated unless pt declined. Discussed healthy diet and lifestyle.  

## 2020-03-06 NOTE — Assessment & Plan Note (Signed)
Present on CT. Patient asymptomatic.

## 2020-03-06 NOTE — Assessment & Plan Note (Addendum)
Undergoing yearly CT. Encouraged full nicotine cessation. He currently vapes daily.

## 2020-03-06 NOTE — Assessment & Plan Note (Signed)
Continue aspirin, statin.  

## 2020-03-06 NOTE — Assessment & Plan Note (Signed)
Encouraged avoiding added sugars in diet.  

## 2020-03-06 NOTE — Telephone Encounter (Signed)
I left a detailed message on patient's voice mail w/ appointment time and covid screening.

## 2020-03-06 NOTE — Assessment & Plan Note (Signed)
H/o this.  ?

## 2020-03-06 NOTE — Patient Instructions (Addendum)
Pass by lab to pick up stool kit.  Blood work today.  Congrats on upcoming retirement! Return for welcome to medicare visit when you get medicare.   Health Maintenance After Age 67 After age 64, you are at a higher risk for certain long-term diseases and infections as well as injuries from falls. Falls are a major cause of broken bones and head injuries in people who are older than age 23. Getting regular preventive care can help to keep you healthy and well. Preventive care includes getting regular testing and making lifestyle changes as recommended by your health care provider. Talk with your health care provider about:  Which screenings and tests you should have. A screening is a test that checks for a disease when you have no symptoms.  A diet and exercise plan that is right for you. What should I know about screenings and tests to prevent falls? Screening and testing are the best ways to find a health problem early. Early diagnosis and treatment give you the best chance of managing medical conditions that are common after age 47. Certain conditions and lifestyle choices may make you more likely to have a fall. Your health care provider may recommend:  Regular vision checks. Poor vision and conditions such as cataracts can make you more likely to have a fall. If you wear glasses, make sure to get your prescription updated if your vision changes.  Medicine review. Work with your health care provider to regularly review all of the medicines you are taking, including over-the-counter medicines. Ask your health care provider about any side effects that may make you more likely to have a fall. Tell your health care provider if any medicines that you take make you feel dizzy or sleepy.  Osteoporosis screening. Osteoporosis is a condition that causes the bones to get weaker. This can make the bones weak and cause them to break more easily.  Blood pressure screening. Blood pressure changes and  medicines to control blood pressure can make you feel dizzy.  Strength and balance checks. Your health care provider may recommend certain tests to check your strength and balance while standing, walking, or changing positions.  Foot health exam. Foot pain and numbness, as well as not wearing proper footwear, can make you more likely to have a fall.  Depression screening. You may be more likely to have a fall if you have a fear of falling, feel emotionally low, or feel unable to do activities that you used to do.  Alcohol use screening. Using too much alcohol can affect your balance and may make you more likely to have a fall. What actions can I take to lower my risk of falls? General instructions  Talk with your health care provider about your risks for falling. Tell your health care provider if: ? You fall. Be sure to tell your health care provider about all falls, even ones that seem minor. ? You feel dizzy, sleepy, or off-balance.  Take over-the-counter and prescription medicines only as told by your health care provider. These include any supplements.  Eat a healthy diet and maintain a healthy weight. A healthy diet includes low-fat dairy products, low-fat (lean) meats, and fiber from whole grains, beans, and lots of fruits and vegetables. Home safety  Remove any tripping hazards, such as rugs, cords, and clutter.  Install safety equipment such as grab bars in bathrooms and safety rails on stairs.  Keep rooms and walkways well-lit. Activity   Follow a regular exercise program to  stay fit. This will help you maintain your balance. Ask your health care provider what types of exercise are appropriate for you.  If you need a cane or walker, use it as recommended by your health care provider.  Wear supportive shoes that have nonskid soles. Lifestyle  Do not drink alcohol if your health care provider tells you not to drink.  If you drink alcohol, limit how much you have: ? 0-1  drink a day for women. ? 0-2 drinks a day for men.  Be aware of how much alcohol is in your drink. In the [place] one drink equals one typical bottle of beer (12 oz), one-half glass of wine (5 oz), or one shot of hard liquor (1 oz).  Do not use any products that contain nicotine or tobacco, such as cigarettes and e-cigarettes. If you need help quitting, ask your health care provider. Summary  Having a healthy lifestyle and getting preventive care can help to protect your health and wellness after age 42.  Screening and testing are the best way to find a health problem early and help you avoid having a fall. Early diagnosis and treatment give you the best chance for managing medical conditions that are more common for people who are older than age 42.  Falls are a major cause of broken bones and head injuries in people who are older than age 8. Take precautions to prevent a fall at home.  Work with your health care provider to learn what changes you can make to improve your health and wellness and to prevent falls. This information is not intended to replace advice given to you by your health care provider. Make sure you discuss any questions you have with your health care provider. Document Revised: 03/28/2019 Document Reviewed: 10/18/2017 Elsevier Patient Education  2020 [employer].

## 2020-03-06 NOTE — Assessment & Plan Note (Addendum)
Known h/o this, has seen cardiology planned echo Q2 yrs

## 2020-03-07 ENCOUNTER — Encounter: Payer: Self-pay | Admitting: Family Medicine

## 2020-03-07 DIAGNOSIS — R748 Abnormal levels of other serum enzymes: Secondary | ICD-10-CM

## 2020-03-07 DIAGNOSIS — K760 Fatty (change of) liver, not elsewhere classified: Secondary | ICD-10-CM

## 2020-03-07 LAB — GAMMA GT: GGT: 349 U/L — ABNORMAL HIGH (ref 3–70)

## 2020-03-09 NOTE — Telephone Encounter (Signed)
See other mychart note.

## 2020-03-09 NOTE — Telephone Encounter (Signed)
GGT and ALP elevated since last check 10/2019 (atorvastatin was increased to 80mg  at that time). Fractionated ALP pending.  I suggested pt drop back to atorva 40mg  with recheck labs in 3 wks, and I will also check abd [name].  fyi to Dr Fletcher Anon.

## 2020-03-10 LAB — ALKALINE PHOSPHATASE ISOENZYMES
Alkaline phosphatase (APISO): 226 U/L — ABNORMAL HIGH (ref 35–144)
Bone Isoenzymes: 6 % — ABNORMAL LOW (ref 28–66)
Intestinal Isoenzymes: 2 % (ref 1–24)
Liver Isoenzymes: 81 % — ABNORMAL HIGH (ref 25–69)
Macrohepatic isoenzymes: 11 % — ABNORMAL HIGH

## 2020-03-13 ENCOUNTER — Telehealth: Payer: Self-pay

## 2020-03-13 ENCOUNTER — Other Ambulatory Visit: Payer: Self-pay

## 2020-03-13 ENCOUNTER — Other Ambulatory Visit (INDEPENDENT_AMBULATORY_CARE_PROVIDER_SITE_OTHER): Payer: Managed Care, Other (non HMO)

## 2020-03-13 ENCOUNTER — Encounter: Payer: Self-pay | Admitting: Family Medicine

## 2020-03-13 ENCOUNTER — Ambulatory Visit
Admission: RE | Admit: 2020-03-13 | Discharge: 2020-03-13 | Disposition: A | Discharge status: Home / Self Care | Payer: Managed Care, Other (non HMO) | Source: Ambulatory Visit | Attending: Family Medicine | Admitting: Family Medicine

## 2020-03-13 DIAGNOSIS — K760 Fatty (change of) liver, not elsewhere classified: Secondary | ICD-10-CM

## 2020-03-13 DIAGNOSIS — K824 Cholesterolosis of gallbladder: Secondary | ICD-10-CM | POA: Insufficient documentation

## 2020-03-13 DIAGNOSIS — R748 Abnormal levels of other serum enzymes: Secondary | ICD-10-CM

## 2020-03-13 DIAGNOSIS — Z1211 Encounter for screening for malignant neoplasm of colon: Secondary | ICD-10-CM | POA: Diagnosis not present

## 2020-03-13 DIAGNOSIS — I714 Abdominal aortic aneurysm, without rupture, unspecified: Secondary | ICD-10-CM

## 2020-03-13 DIAGNOSIS — K769 Liver disease, unspecified: Secondary | ICD-10-CM

## 2020-03-13 LAB — FECAL OCCULT BLOOD, IMMUNOCHEMICAL: Fecal Occult Bld: POSITIVE — AB

## 2020-03-13 NOTE — Telephone Encounter (Signed)
[facility] called in a positive IFOB @ 1400

## 2020-03-13 NOTE — Telephone Encounter (Addendum)
Spoke with patient.  Will refer to GI for positive iFOB.  Also reviewed [name] results - will proceed with CT scan for further eval of abnormal liver. Patient requests late PM on Mon Tues or Wed

## 2020-03-13 NOTE — Addendum Note (Signed)
Addended by: Ria Bush on: 03/13/2020 05:07 PM   Modules accepted: Orders

## 2020-03-18 ENCOUNTER — Ambulatory Visit (INDEPENDENT_AMBULATORY_CARE_PROVIDER_SITE_OTHER)
Admission: RE | Admit: 2020-03-18 | Discharge: 2020-03-18 | Disposition: A | Discharge status: Home / Self Care | Payer: Managed Care, Other (non HMO) | Source: Ambulatory Visit | Attending: Family Medicine | Admitting: Family Medicine

## 2020-03-18 ENCOUNTER — Other Ambulatory Visit: Payer: Self-pay

## 2020-03-18 DIAGNOSIS — R748 Abnormal levels of other serum enzymes: Secondary | ICD-10-CM

## 2020-03-18 DIAGNOSIS — I714 Abdominal aortic aneurysm, without rupture, unspecified: Secondary | ICD-10-CM

## 2020-03-18 DIAGNOSIS — K769 Liver disease, unspecified: Secondary | ICD-10-CM | POA: Diagnosis not present

## 2020-03-18 MED ORDER — IOHEXOL 300 MG/ML  SOLN
100.0000 mL | Freq: Once | INTRAMUSCULAR | Status: AC | PRN
  Administered 2020-03-18: 16:00:00 100 mL via INTRAVENOUS

## 2020-03-18 NOTE — Progress Notes (Signed)
[facility] Evaluation  03/18/2020    Patient ID:  Name: Stephen [name]  MRN: [id number]  DOB: 25-Nov-1951    Reason for Visit: Follow-up.    DATE OF SURGERY: 09/06/19    PROCEDURE: XLIF L3 thru L5; Laminectomy an PSIF L3 thru L5    HPI:   Stephen [name] is a 69 year old male who presents to clinic for follow-up evaluation 6 months from s/p lumbar decompression and fusion as noted above.  He continues to take gabapentin for pain but is no longer taking narcotic pain medication. He states that he continues to improve with regard to his activities although he does have some pain involving the left lumbosacral and SI joint region, with radiation into the left buttocks and hamstring as well as the hip. He also relates that he has been having issues with leg edema and swelling. He is trying to be more active and is walking more.  He does relate some ongoing left greater than right calf pain as well.  pain is sometimes worse with activity but is not necessarily better with rest. Sometimes his calves bother him when is laying down as well. He also reports aggregating his low back while lifting something recently. He started outpatient physical therapy recently and continues with this. He continues to uses a bone stimulator. He also reports feeling colder than usual and states he continues to have anemia with Hgb 10. He recently started iron supplementation with his PCP.    Past medical history, past surgical history, social history, family history, allergies, and medications are unchanged from prior visit.    Physical Examination:   BP 123/74 (BP Location: Left arm, BP Patient Position: Sitting, BP cuff size: Regular)   Pulse 58   Temp 97.6 F (36.4 C) (Oral)   Ht 5\' 7"  (1.702 m)   Wt 91.6 kg (202 lb)   BMI 31.64 kg/m     General / Psychiatric: healthy, no distress, cooperative, AOx3, normal affect, appropriate  Cardio: RRR per peripheral pulses  Pulmonary: breathing quietly without use of accessory  muscles  Neck/Back: Posterior and lateral incision well healed without surrounding erythema or active drainage    Bilateral Lower Extremity   Motor:       Right           Left        Iliopsoas (L2/3)                     5/5           5/5         Quadriceps (L4)                    5/5           5/5        Tibialis Anterior (L4/5)           5/5           5/5        EHL (L5)                               5/5           5/5        GSC (S1)  5/5           5/5   Sensation:        SILT in L2-S1 distributions.   Vascular:        Toes WWP.    Imaging:   Radiographs of the lumbar spine, AP and lateral, were obtained today 12/18/2019 and reviewed personally in clinic which demonstrate intact hardware and maintained alignment.      Assessment & Plan:  69 year old male presenting for 6 month follow-up evaluation of XLIF L3 thru L5 with PSIF L3 thru L5. Imaging looks great and he is doing well with regard to his surgical region. He presents with symptoms consistent with aggravating his low back and left piriformis syndrome, as well as possible component of left SI joint pain. We discussed options in detail. Placed referral for pain management for staged completion of left SI joint injection as well as left L5-S1 TFESI. Provided referral to vascular surgery for leg edema and swelling.Continue HEP and PT. Over 45 minutes were spent in discussion and counseling and review of plan and he was in agreement with plan as discussed.     FOLLOWUP: Return to clinic after 3 months with XR lumbar spine, AP and lateral (order placed). The patient is encouraged to call us with any questions or problems in the interim. Our contact numbers were given to the patient.

## 2020-03-19 ENCOUNTER — Other Ambulatory Visit: Payer: Self-pay

## 2020-03-19 ENCOUNTER — Encounter (HOSPITAL_BASED_OUTPATIENT_CLINIC_OR_DEPARTMENT_OTHER): Payer: Self-pay | Admitting: Physical Medicine & Rehabilitation

## 2020-03-21 ENCOUNTER — Other Ambulatory Visit: Payer: Medicare Other | Attending: Orthopaedic Surgery of the Spine

## 2020-03-21 DIAGNOSIS — M533 Sacrococcygeal disorders, not elsewhere classified: Secondary | ICD-10-CM | POA: Insufficient documentation

## 2020-03-21 DIAGNOSIS — Z981 Arthrodesis status: Secondary | ICD-10-CM | POA: Insufficient documentation

## 2020-03-21 DIAGNOSIS — G8929 Other chronic pain: Secondary | ICD-10-CM | POA: Insufficient documentation

## 2020-03-21 DIAGNOSIS — Z20828 Contact with and (suspected) exposure to other viral communicable diseases: Secondary | ICD-10-CM | POA: Insufficient documentation

## 2020-03-21 DIAGNOSIS — M5416 Radiculopathy, lumbar region: Secondary | ICD-10-CM | POA: Insufficient documentation

## 2020-03-21 LAB — COVID-19 CORONAVIRUS DETECTION ASSAY AT ~~LOC~~ LAB: COVID-19 Coronavirus Result: NOT DETECTED

## 2020-03-23 ENCOUNTER — Ambulatory Visit (INDEPENDENT_AMBULATORY_CARE_PROVIDER_SITE_OTHER): Payer: Self-pay | Admitting: Gastroenterology

## 2020-03-23 ENCOUNTER — Other Ambulatory Visit: Payer: Self-pay

## 2020-03-23 ENCOUNTER — Other Ambulatory Visit (HOSPITAL_BASED_OUTPATIENT_CLINIC_OR_DEPARTMENT_OTHER): Payer: Self-pay | Admitting: Physical Medicine & Rehabilitation

## 2020-03-23 DIAGNOSIS — Z1211 Encounter for screening for malignant neoplasm of colon: Secondary | ICD-10-CM

## 2020-03-23 DIAGNOSIS — M5416 Radiculopathy, lumbar region: Secondary | ICD-10-CM

## 2020-03-23 MED ORDER — PEG 3350-KCL-NA BICARB-NACL 420 G PO SOLR: 4000.0000 mL | Freq: Once | ORAL | 0 refills | Status: AC

## 2020-03-23 NOTE — Progress Notes (Signed)
Gastroenterology Pre-Procedure Review  Request Date: Monday 04/13/20 Requesting Physician: Dr. Marius Ditch  PATIENT REVIEW QUESTIONS: The patient responded to the following health history questions as indicated:    1. Are you having any GI issues? Recuring diverticulitis from time to tim3 2. Do you have a personal history of Polyps? no 3. Do you have a family history of Colon Cancer or Polyps? no 4. Diabetes Mellitus? no 5. Joint replacements in the past 12 months?no 6. Major health problems in the past 3 months?no 7. Any artificial heart valves, MVP, or defibrillator?no    MEDICATIONS & ALLERGIES:    Patient reports the following regarding taking any anticoagulation/antiplatelet therapy:   Plavix, Coumadin, Eliquis, Xarelto, Lovenox, Pradaxa, Brilinta, or Effient? no Aspirin? yes (81 mg daily)  Patient confirms/reports the following medications:  Current Outpatient Medications  Medication Sig Dispense Refill  . albuterol (PROVENTIL HFA;VENTOLIN HFA) 108 (90 Base) MCG/ACT inhaler Inhale 2 puffs into the lungs every 6 (six) hours as needed for wheezing or shortness of breath. 1 Inhaler 0  . aspirin 81 MG tablet Take 1 tablet (81 mg total) by mouth daily.    Marland Kitchen atorvastatin (LIPITOR) 80 MG tablet Take 1 tablet (80 mg total) by mouth daily. 90 tablet 3  . Multiple Vitamin (MULTI-VITAMIN DAILY PO) Take by mouth.    . polyethylene glycol-electrolytes (NULYTELY) 420 g solution Take 4,000 mLs by mouth once for 1 dose. 4000 mL 0  . scopolamine (TRANSDERM-SCOP, 1.5 MG,) 1 MG/3DAYS Place 1 patch (1.5 mg total) onto the skin every 3 (three) days. 4 patch 1  . sildenafil (VIAGRA) 100 MG tablet TAKE ONE-HALF TO ONE TABLET(50 TO 100MG  TOTAL) BY MOUTH DAILY AS NEEDED FOR ERECTILE DYSFUNCTION. GENERIC EQUIVALENT FOR VIAGRA 15 tablet 3   No current facility-administered medications for this visit.    Patient confirms/reports the following allergies:  Allergies  Allergen Reactions  . Erythromycin    REACTION: ANAPHYLACTIC SHOCK    No orders of the defined types were placed in this encounter.   AUTHORIZATION INFORMATION Primary Insurance: 1D#: Group #:  Secondary Insurance: 1D#: Group #:  SCHEDULE INFORMATION: Date: 04/13/20 Time: Location:[facility]

## 2020-03-24 ENCOUNTER — Encounter (HOSPITAL_BASED_OUTPATIENT_CLINIC_OR_DEPARTMENT_OTHER)
Admission: RE | Disposition: A | Discharge status: Home Health | Payer: Self-pay | Attending: Physical Medicine & Rehabilitation

## 2020-03-24 ENCOUNTER — Ambulatory Visit (HOSPITAL_BASED_OUTPATIENT_CLINIC_OR_DEPARTMENT_OTHER)
Admission: RE | Admit: 2020-03-24 | Discharge: 2020-03-24 | Disposition: A | Discharge status: Home Health | Payer: Medicare Other

## 2020-03-24 ENCOUNTER — Ambulatory Visit
Admission: RE | Admit: 2020-03-24 | Discharge: 2020-03-24 | Disposition: A | Discharge status: Home / Self Care | Payer: Medicare Other | Attending: Physical Medicine & Rehabilitation | Admitting: Physical Medicine & Rehabilitation

## 2020-03-24 DIAGNOSIS — Q620 Congenital hydronephrosis: Secondary | ICD-10-CM | POA: Insufficient documentation

## 2020-03-24 DIAGNOSIS — M130 Polyarthritis, unspecified: Secondary | ICD-10-CM | POA: Insufficient documentation

## 2020-03-24 DIAGNOSIS — M5416 Radiculopathy, lumbar region: Secondary | ICD-10-CM | POA: Insufficient documentation

## 2020-03-24 DIAGNOSIS — M109 Gout, unspecified: Secondary | ICD-10-CM | POA: Insufficient documentation

## 2020-03-24 DIAGNOSIS — M533 Sacrococcygeal disorders, not elsewhere classified: Secondary | ICD-10-CM | POA: Insufficient documentation

## 2020-03-24 DIAGNOSIS — Z981 Arthrodesis status: Secondary | ICD-10-CM | POA: Insufficient documentation

## 2020-03-24 DIAGNOSIS — Z87442 Personal history of urinary calculi: Secondary | ICD-10-CM | POA: Insufficient documentation

## 2020-03-24 DIAGNOSIS — Z960 Presence of urogenital implants: Secondary | ICD-10-CM | POA: Insufficient documentation

## 2020-03-24 DIAGNOSIS — N289 Disorder of kidney and ureter, unspecified: Secondary | ICD-10-CM | POA: Insufficient documentation

## 2020-03-24 DIAGNOSIS — Z882 Allergy status to sulfonamides status: Secondary | ICD-10-CM | POA: Insufficient documentation

## 2020-03-24 DIAGNOSIS — F329 Major depressive disorder, single episode, unspecified: Secondary | ICD-10-CM | POA: Insufficient documentation

## 2020-03-24 DIAGNOSIS — Z880 Allergy status to penicillin: Secondary | ICD-10-CM | POA: Insufficient documentation

## 2020-03-24 DIAGNOSIS — G8929 Other chronic pain: Secondary | ICD-10-CM | POA: Insufficient documentation

## 2020-03-24 SURGERY — PAIN INJECTION, SACROILIAC JOINT
Laterality: Left

## 2020-03-24 MED ORDER — BUPIVACAINE HCL (PF) 0.25 % IJ SOLN
INTRAMUSCULAR | Status: DC | PRN
Start: 2020-03-24 — End: 2020-03-24
  Administered 2020-03-24 (×2): 1.5 mL via PERINEURAL

## 2020-03-24 MED ORDER — TRIAMCINOLONE ACETONIDE 40 MG/ML IJ SUSP
INTRAMUSCULAR | Status: DC | PRN
Start: 2020-03-24 — End: 2020-03-24
  Administered 2020-03-24: 20 mg via PERINEURAL

## 2020-03-24 MED ORDER — TRIAMCINOLONE ACETONIDE 40 MG/ML IJ SUSP
INTRAMUSCULAR | Status: AC
Start: 2020-03-24 — End: 2020-03-24
  Filled 2020-03-24: qty 1

## 2020-03-24 MED ORDER — IOHEXOL 240 MG/ML IJ SOLN
INTRAMUSCULAR | Status: AC
Start: 2020-03-24 — End: 2020-03-24
  Filled 2020-03-24: qty 10

## 2020-03-24 MED ORDER — IOHEXOL 240 MG/ML IJ SOLN
INTRAMUSCULAR | Status: DC | PRN
Start: 2020-03-24 — End: 2020-03-24
  Administered 2020-03-24: 1 mL via PERINEURAL

## 2020-03-24 MED ORDER — BUPIVACAINE HCL (PF) 0.25 % IJ SOLN
INTRAMUSCULAR | Status: AC
Start: 2020-03-24 — End: 2020-03-24
  Filled 2020-03-24: qty 10

## 2020-03-24 SURGICAL SUPPLY — 14 items
APPLICATOR CHLORAPREP 3ML, CLEAR (Misc Medical Supply) ×2 IMPLANT
COVER ULTRASOUND PROBE W/ GEL (Drapes/towels) IMPLANT
GLOVE SURGEON BIOGEL SIZE 7 (Gloves/Gowns) ×2
GLOVE SURGEON BIOGEL SIZE 7.5 (Gloves/Gowns) ×2 IMPLANT
GLOVE SURGICAL BIOGEL SIZE 8 (Gloves/Gowns) ×2 IMPLANT
MARKER SECURELINE SURG SKIN (Misc Medical Supply) ×2
NEEDLE ECHOBLOCK MSK 21G X 3 1/8" (Needles/punch/cannula/biopsy) IMPLANT
NEEDLE ECHOBLOCK MSK 21G X 4" (Needles/punch/cannula/biopsy)
NEEDLE ECHOBLOCK MSK 22G X 2" (Needles/punch/cannula/biopsy)
NEEDLE SPINAL BD 22G X 3.5" (Needles/punch/cannula/biopsy) IMPLANT
NEEDLE SPINAL BD 25G X 3.5" (Needles/punch/cannula/biopsy)
NEEDLE SPINE QUINCKE 23G X 3.5" (Needles/punch/cannula/biopsy) ×2 IMPLANT
NEEDLE SPINE QUINCKE 25G X 3.5" (Needles/punch/cannula/biopsy) IMPLANT
TRAY SINGLE SHOT EPIDURAL (Procedure Packs/kits) ×2 IMPLANT

## 2020-03-24 NOTE — H&P (Signed)
Ambulatory Surgery/Invasive Procedure History and Physical      Primary Care Physician [name]    Chief Complaint:  Low back pain     69 year old male     BP 147/81 (BP Location: Left arm, BP Patient Position: Prone)    Pulse (!) 49    Temp 97.6 F (36.4 C)    Resp 10    SpO2 98%     Past Medical History:   Diagnosis Date    Chronic suprapubic catheter (CMS-HCC)     Congenital hydronephrosis     Gout     Headache     Hematuria     Kidney disease     Kidney stones     Major depressive disorder, single episode     Polyarthropathy or polyarthritis of multiple sites     Retinal detachment     Urethral stricture        Allergies   Allergen Reactions    Amoxicillin Rash    Sulfa Drugs Unspecified       No current facility-administered medications for this encounter.        I have reviewed the past medical history, allergies and current medications as documented in the electronic health record.      Physical Exam       Can this patient make their own healthcare decisions?  Yes    Chest:  Breaths easily     Heart:  RRR    Abdomen:  Soft    Pain Management Needs/Options discussed.    No Advanced Directives.      Resuscitative Status:  Full Code, Full Care    Diagnosis:sacroiliac pain    Procedure: sacroiliac joint injection    This is the first injection of this type.  The procedure is designed to be diagnostic and therapeutic.    Discussed Risks, Benefits, and Alternatives to procedure.  Questions answered.  Patient voiced understanding and wished to proceed. Consent Signed.    Risk include:    Minor adverse effects: Superficial infection, bleeding, bruising, headache, worsening of pain, lack of benefit, flushing or redness of the face and neck, elevated blood pressure, increased blood sugar, difficulty sleeping, changes in mood, nausea, or vomiting. A transient sensation of tingling or shooting during the injection.    Rare but serious adverse effects: Allergic reaction, internal bleeding, infection in  deeper tissue such as the discs or spine requiring prolonged or intravenous antibiotics or surgery, punctured lungs, changes in blood pressure, pulse and respirations, cardiac arrest, permanented injury to nerves or spinal cord, paralysis, stroke and even death.     See procedure note of same date.

## 2020-03-24 NOTE — Discharge Instructions (Signed)
Patient verbalized understanding of below instructions    Procedure Done Today: left sacroiliac joint injection    Follow-up in Orthopedics    Post Procedure Instructions  Continue present medication unless otherwise indicated.  Resume your normal diet after being discharge.  Ice pack to treatment site (no more than 20 minutes at a time) if needed for pain relief and/or muscle spasm.  Avoid strenuous activities and driving until tomorrow morning.  If you have a band-aid dressing, you may remove it tomorrow morning.  Resume normal activities tomorrow morning, unless otherwise directed.  No submersion in water for 24 hrs.      If your next appointment is at the Pain Clinic, then please call [phone] to make an appointment.    If your next appointment is another procedure, then please call [phone] to make an appointment.    If another procedure was ordered, please allow up to 14 business days for authorization to be processed and you will be contacted to schedule your procedure once it has been approved.  If you have not heard from anyone in 3 weeks, please call us for an update.      [facility], [address], [place], [place],  [place]    Call the [facility] at [phone] and ask for the Pain Management Fellow on-call for ANY signs of:  Temperature above 101.5 F  Redness or drainage at the treatment site  Severe, uncontrollable headache    Call 911 IN CASE OF EMERGENCY

## 2020-03-24 NOTE — Op Note (Signed)
[facility] Note, [facility]    Preoperative Diagnosis: Left  Sacroiliac Joint Dysfunction     Postoperative Diagnosis: Left  Sacroiliac Joint Dysfunction    Procedure:  1. Left  sacroiliac steroid injection    2.   Fluoroscopy for needle guidance.  3. Sacroiliac Joint Arthrogram     Surgeon/Staff:  Bobbie Stack MD, MHS  Assistant: Claud Kelp, M.D.    Anesthesia: Lidocaine 1%            Indications: The patient c/o  Buttocks Pain. The patient's history and physical findings are consistent with  left Sacroiliac Joint Dysfunction. They are here for an injection at the site thought to be the source of their pain. This is the first injection performed for this patient.     Procedure in detail:  Written informed consent was obtained.  The chart was reviewed, questions were answered and the patient wished to proceed. The patient had no contraindications to the procedure.  Vital signs were stable. Standard monitoring was applied.  The patient, the procedure nurse, and the physician confirmed the site of injection after an official "time out." The skin over the injection site was also marked and confirmed as the site of the injection.    Localization Time Out:  An additional intraoperative timeout, specifically to confirm accurate localization, was conducted by the attending physician.     The patient remained awake and alert throughout the procedure.  The patient was placed in the prone position.  The skin was prepped with chlorhexidine and sterile drapes were applied. The skin and soft tissues were anesthetized with Lidocaine 1%. A  22  gauge 3.5 inch needle was inserted percutaneously and advanced under fluoroscopic guidance using dorsal oblique, AP, and lateral projections.  The needle tip entered the left sacroiliac joint. No paraesthesias occurred and aspiration was negative  Fluoroscopy was also used in multiple planes to ensure proper and safe placement of the needle.  The joint was then inject with  Omnipaque 240 under live fluoroscopy and it show flow within the joint.    The needle(s) were injected with Triamcinolome (40 mg/mL)  0.5 mL and Bupivicaine (0.25%)  1.5 mL and removed.  A sterile bandage was applied.  The patient did not experience any hemodynamic or neurologic sequelae. The attending physician was present during all critical points of the procedure.    The patient was transferred to the recovery area.  Post operative instructions were explained and given to the patient. The patient was discharged in stable health and given a follow up clinic in 2 weeks with Ortho spine.

## 2020-03-30 ENCOUNTER — Telehealth (HOSPITAL_BASED_OUTPATIENT_CLINIC_OR_DEPARTMENT_OTHER): Payer: Self-pay | Admitting: Vascular Surgery

## 2020-03-30 NOTE — Telephone Encounter (Signed)
Patient is scheduled for in-clinic appointment.    Appointment scheduled for 04/13/20    1. Do you have or have you had in the last 24 hours: :  • Fever:  N  • New cough (not chronic) :  N  • Shortness of breath:  N  o If yes to any of these, we will not schedule and route an encounter to department specific triage for both NEW and RET patients to assess  1. If patient has an appointment, we will cancel the appointment & route a message to the triage pool.    2. Have you knowingly been exposed to anyone having any, some or all of the symptoms listed above?   N    3. Have you traveled outside of the [place] in the last 14 days? .   N    If scheduling appointment: schedule according to specific clinic guidelines.    If no to all questions, document and close encounter.  Do not route.      Did you inform patient of no visitor policy Y

## 2020-04-01 ENCOUNTER — Other Ambulatory Visit: Payer: Self-pay

## 2020-04-04 ENCOUNTER — Other Ambulatory Visit (HOSPITAL_BASED_OUTPATIENT_CLINIC_OR_DEPARTMENT_OTHER): Payer: Medicare Other

## 2020-04-05 ENCOUNTER — Other Ambulatory Visit: Payer: Medicare Other | Attending: Orthopaedic Surgery of the Spine

## 2020-04-05 DIAGNOSIS — Z981 Arthrodesis status: Secondary | ICD-10-CM | POA: Insufficient documentation

## 2020-04-05 DIAGNOSIS — M533 Sacrococcygeal disorders, not elsewhere classified: Secondary | ICD-10-CM | POA: Insufficient documentation

## 2020-04-05 DIAGNOSIS — M5416 Radiculopathy, lumbar region: Secondary | ICD-10-CM | POA: Insufficient documentation

## 2020-04-05 DIAGNOSIS — Z20822 Contact with and (suspected) exposure to covid-19: Secondary | ICD-10-CM | POA: Insufficient documentation

## 2020-04-05 DIAGNOSIS — G8929 Other chronic pain: Secondary | ICD-10-CM | POA: Insufficient documentation

## 2020-04-05 LAB — COVID-19 CORONAVIRUS DETECTION ASSAY AT ~~LOC~~ LAB: COVID-19 Coronavirus Result: NOT DETECTED

## 2020-04-07 ENCOUNTER — Other Ambulatory Visit (HOSPITAL_BASED_OUTPATIENT_CLINIC_OR_DEPARTMENT_OTHER): Payer: Self-pay | Admitting: Physical Medicine & Rehabilitation

## 2020-04-07 DIAGNOSIS — M5416 Radiculopathy, lumbar region: Secondary | ICD-10-CM

## 2020-04-08 ENCOUNTER — Ambulatory Visit (HOSPITAL_BASED_OUTPATIENT_CLINIC_OR_DEPARTMENT_OTHER): Payer: Medicare Other

## 2020-04-08 ENCOUNTER — Encounter (HOSPITAL_BASED_OUTPATIENT_CLINIC_OR_DEPARTMENT_OTHER)
Admission: RE | Disposition: A | Discharge status: Home Health | Payer: Self-pay | Attending: Physical Medicine & Rehabilitation

## 2020-04-08 ENCOUNTER — Ambulatory Visit
Admission: RE | Admit: 2020-04-08 | Discharge: 2020-04-08 | Disposition: A | Discharge status: Home / Self Care | Payer: Medicare Other | Attending: Physical Medicine & Rehabilitation | Admitting: Physical Medicine & Rehabilitation

## 2020-04-08 DIAGNOSIS — G8929 Other chronic pain: Secondary | ICD-10-CM | POA: Insufficient documentation

## 2020-04-08 DIAGNOSIS — Z981 Arthrodesis status: Secondary | ICD-10-CM | POA: Insufficient documentation

## 2020-04-08 DIAGNOSIS — Z882 Allergy status to sulfonamides status: Secondary | ICD-10-CM | POA: Insufficient documentation

## 2020-04-08 DIAGNOSIS — M5416 Radiculopathy, lumbar region: Secondary | ICD-10-CM | POA: Insufficient documentation

## 2020-04-08 DIAGNOSIS — M533 Sacrococcygeal disorders, not elsewhere classified: Secondary | ICD-10-CM | POA: Insufficient documentation

## 2020-04-08 DIAGNOSIS — Z87442 Personal history of urinary calculi: Secondary | ICD-10-CM | POA: Insufficient documentation

## 2020-04-08 DIAGNOSIS — Z880 Allergy status to penicillin: Secondary | ICD-10-CM | POA: Insufficient documentation

## 2020-04-08 SURGERY — PAIN ESI LUMBAR TRANSFORAMINAL
Laterality: Left

## 2020-04-08 MED ORDER — IOHEXOL 240 MG/ML IJ SOLN
INTRAMUSCULAR | Status: DC | PRN
Start: 2020-04-08 — End: 2020-04-08
  Administered 2020-04-08: 1 mL via EPIDURAL

## 2020-04-08 MED ORDER — DEXAMETHASONE SOD PHOSPHATE PF 10 MG/ML IJ SOLN
INTRAMUSCULAR | Status: AC
Start: 2020-04-08 — End: 2020-04-08
  Filled 2020-04-08: qty 1

## 2020-04-08 MED ORDER — DEXAMETHASONE SODIUM PHOSPHATE 10 MG/ML IJ SOLN (CUSTOM)
INTRAMUSCULAR | Status: DC | PRN
Start: 2020-04-08 — End: 2020-04-08
  Administered 2020-04-08: 10:00:00 5 mg via EPIDURAL

## 2020-04-08 MED ORDER — IOHEXOL 240 MG/ML IJ SOLN
INTRAMUSCULAR | Status: AC
Start: 2020-04-08 — End: 2020-04-08
  Filled 2020-04-08: qty 10

## 2020-04-08 SURGICAL SUPPLY — 10 items
APPLICATOR CHLORAPREP 3ML, CLEAR (Misc Medical Supply) ×2
CANNULA NASAL FLARED W/7' TUBE (Misc Medical Supply) IMPLANT
ELECTRODE MONITOR W/ GEL (Misc Medical Supply)
EXTENSION SET M20 LEN 2ML (Tubing/Suction) ×2
FILTERLINE ETC02  ~~LOC~~ (SHORT TERM) (Misc Medical Supply) IMPLANT
MARKER SECURELINE SURG SKIN (Misc Medical Supply) ×2 IMPLANT
NEEDLE PROTECT 18G X 1.5" (Needles/punch/cannula/biopsy) ×2 IMPLANT
NEEDLE SPINAL BD 22G X 3.5" (Needles/punch/cannula/biopsy)
NEEDLE SPINAL BD 25G X 3.5" (Needles/punch/cannula/biopsy) ×2
TRAY SINGLE SHOT EPIDURAL (Procedure Packs/kits) ×2 IMPLANT

## 2020-04-08 NOTE — Op Note (Signed)
Procedure Note, [facility]    Preoperative Diagnosis: Lumbar radiculopathy    Postoperative Diagnosis: Lumbar radiculopathy    Procedure: 1.Left    L5-S1   transforminal epidural steroid injection.   2.   Fluoroscopy for needle guidance.      Surgeon/Staff:  Bobbie Stack MD, MHS  Assistant: Arvella Merles, M.D.    Anesthesia:  Lidocaine 1%                                          Indications: The patient c/o Axial Low Back Pain and Lower Extremity Pain. Patient's history and physical findings are consistent with Lumbar radiculopathy. They are here for an injection at the site thought to be the source of their pain. This is the first injection performed for this patient.    Procedure in detail:  Written informed consent was obtained.  The chart was reviewed, questions were answered and the patient wished to proceed. The patient had no contraindications to the procedure. Vital signs were stable.   Standard monitoring was applied.  The patient, the procedure nurse, and the physician confirmed the site of injection after an official "time out." The skin over the injection site was also marked and confirmed as the site of the injection.    Localization Time Out:  An additional intraoperative timeout, specifically to confirm accurate localization, was conducted by the attending physician.     The patient remained awake and alert throughout the procedure.  The patient was placed in the prone position.  The skin was prepped with chlorhexidine and sterile drapes were applied. The skin and soft tissues were anesthetized with Lidocaine 1%.     At each level a 23  gauge 3.5 inch needle was inserted percutaneously and advanced under fluoroscopic guidance using dorsal oblique, AP, and lateral projections.  The needle tip entered the neural foramen at each level defined above to rest in the anterior epidural space. No paraesthesias occurred and aspiration was negative Epidurography and radiographic interpretation:  Epidurography was performed by injection of Omnipaque 240 under live fluoroscopy.  Multiple Xray images were obtained.  Radiologic examination confirmed proper spread of the contrast medium within the epidural space.  There was no evidence of intrathecal or intravascular runoff. Lumbar epidurography and radiographic interpretation:  Lumbar epidurography was performed by injection of Omnipaque 240 under live fluoroscopy. Radiologic examination demonstrated the following:  left L5 nerve root, epidural spread..     Each needle was then injected with Dexamethasone (10mg /mL)  0.5 mL and Preservative free normal saline 1 mL.  Each needle was removed.  A sterile bandage was applied.  The patient did not experience any hemodynamic or neurologic sequelae. The attending physician was present during all critical points of the procedure..    The patient was transferred to the recovery area. Post operative instructions were explained and given to the patient.  The patient was discharged in stable health and given a follow up clinic in 4 weeks with Ortho spine.

## 2020-04-08 NOTE — H&P (Signed)
Ambulatory Surgery/Invasive Procedure History and Physical      Primary Care Physician [name]    Chief Complaint:  low back and leg pain     69 year old male     BP 130/72    Pulse 51    Temp 97.7 F (36.5 C)    Resp 14    SpO2 97%     Past Medical History:   Diagnosis Date    Chronic suprapubic catheter (CMS-HCC)     Congenital hydronephrosis     Gout     Headache     Hematuria     Kidney disease     Kidney stones     Major depressive disorder, single episode     Polyarthropathy or polyarthritis of multiple sites     Retinal detachment     Urethral stricture        Allergies   Allergen Reactions    Amoxicillin Rash    Sulfa Drugs Unspecified       No current facility-administered medications for this encounter.        I have reviewed the past medical history, allergies and current medications as documented in the electronic health record.      Physical Exam       Can this patient make their own healthcare decisions?  Yes    Chest:  Breaths easily     Heart:  RRR    Abdomen:  Soft    Pain Management Needs/Options discussed.    No Advanced Directives.      Resuscitative Status:  Full Code, Full Care    Diagnosis:    ICD-10-CM ICD-9-CM    1. Chronic left SI joint pain  M53.3 724.6     G89.29 338.29     Added automatically from request for surgery [id number]   2. Lumbar radiculopathy  M54.16 724.4        Procedure: Lumbar transforaminal epidural steroid injection    This is the first injection of this type.  The procedure is designed to be diagnostic and therapeutic.    Discussed Risks, Benefits, and Alternatives to procedure.  Questions answered.  Patient voiced understanding and wished to proceed. Consent Signed.    Risk include:    Minor adverse effects: Superficial infection, bleeding, bruising, headache, worsening of pain, lack of benefit, flushing or redness of the face and neck, elevated blood pressure, increased blood sugar, difficulty sleeping, changes in mood, nausea, or vomiting. A  transient sensation of tingling or shooting during the injection.    Rare but serious adverse effects: Allergic reaction, internal bleeding, infection in deeper tissue such as the discs or spine requiring prolonged or intravenous antibiotics or surgery, punctured lungs, changes in blood pressure, pulse and respirations, cardiac arrest, permanented injury to nerves or spinal cord, paralysis, stroke and even death.     See procedure note of same date.

## 2020-04-08 NOTE — Discharge Instructions (Signed)
Patient verbalized understanding of below instructions    Procedure Done Today:   Left lumbar transforaminal epidural steroid injection.  Please follow up with Ortho Clinic.    Post Procedure Instructions  Continue present medication unless otherwise indicated.  Resume your normal diet after being discharge.  Ice pack to treatment site (no more than 20 minutes at a time) if needed for pain relief and/or muscle spasm.  Avoid strenuous activities and driving until tomorrow morning.  If you have a band-aid dressing, you may remove it tomorrow morning.  Resume normal activities tomorrow morning, unless otherwise directed.  No submersion in water for 24 hrs.      If your next appointment is at the Pain Clinic, then please call [phone] to make an appointment.    If your next appointment is another procedure, then please call [phone] to make an appointment.    If another procedure was ordered, please allow up to 14 business days for authorization to be processed and you will be contacted to schedule your procedure once it has been approved.  If you have not heard from anyone in 3 weeks, please call us for an update.      [facility], [address] [place], [place],  [place]    Call the [facility] at [phone] and ask for the Pain Management Fellow on-call for ANY signs of:  Temperature above 101.5 F  Redness or drainage at the treatment site  Severe, uncontrollable headache    Call 911 IN CASE OF EMERGENCY

## 2020-04-09 ENCOUNTER — Other Ambulatory Visit: Payer: Self-pay

## 2020-04-09 ENCOUNTER — Other Ambulatory Visit
Admission: RE | Admit: 2020-04-09 | Discharge: 2020-04-09 | Disposition: A | Discharge status: Home / Self Care | Payer: Managed Care, Other (non HMO) | Source: Ambulatory Visit | Attending: Gastroenterology | Admitting: Gastroenterology

## 2020-04-09 ENCOUNTER — Encounter (INDEPENDENT_AMBULATORY_CARE_PROVIDER_SITE_OTHER): Payer: Self-pay | Admitting: Orthopaedic Surgery of the Spine

## 2020-04-09 ENCOUNTER — Telehealth (HOSPITAL_BASED_OUTPATIENT_CLINIC_OR_DEPARTMENT_OTHER): Payer: Self-pay

## 2020-04-09 DIAGNOSIS — Z01812 Encounter for preprocedural laboratory examination: Secondary | ICD-10-CM | POA: Insufficient documentation

## 2020-04-09 DIAGNOSIS — Z20822 Contact with and (suspected) exposure to covid-19: Secondary | ICD-10-CM | POA: Diagnosis not present

## 2020-04-09 DIAGNOSIS — M7989 Other specified soft tissue disorders: Secondary | ICD-10-CM

## 2020-04-09 LAB — SARS CORONAVIRUS 2 (TAT 6-24 HRS): SARS Coronavirus 2: NEGATIVE

## 2020-04-09 NOTE — Telephone Encounter (Addendum)
CallerJavarus [name]  Phone # [phone]  Relationship to patient: Self  Notes:     Pt had [facility] done at a [facility],  a place in [place], and states he will bring the records of it with him to the appointment.     I notified the patient Dr. Orene Desanctis does not see for his Diagnose but pt indicated his referral is to dr. Orene Desanctis.   Please assist.    Caller has been advised of 48 hr turnaround time.

## 2020-04-09 NOTE — Telephone Encounter (Signed)
Patient's appointment with Dr Orene Desanctis has been cancelled on 04/26    Dr Orene Desanctis does not see for Leg swelling and US venous insufficiency is required     Please assist in rescheduling with [name] same day    Thank you

## 2020-04-09 NOTE — Telephone Encounter (Signed)
Hi can you please review this message patient being referred to Dr. Orene Desanctis for leg swelling.    Stephen [name]

## 2020-04-09 NOTE — Telephone Encounter (Signed)
If direct referral ok to see Stephen [name] per [employer]

## 2020-04-10 ENCOUNTER — Encounter: Payer: Self-pay | Admitting: Family

## 2020-04-10 ENCOUNTER — Ambulatory Visit (INDEPENDENT_AMBULATORY_CARE_PROVIDER_SITE_OTHER): Payer: Managed Care, Other (non HMO) | Admitting: Family

## 2020-04-10 VITALS — BP 128/64 | HR 56 | Ht 68.0 in | Wt 233.4 lb

## 2020-04-10 DIAGNOSIS — E785 Hyperlipidemia, unspecified: Secondary | ICD-10-CM

## 2020-04-10 DIAGNOSIS — I350 Nonrheumatic aortic (valve) stenosis: Secondary | ICD-10-CM | POA: Diagnosis not present

## 2020-04-10 DIAGNOSIS — Z720 Tobacco use: Secondary | ICD-10-CM | POA: Insufficient documentation

## 2020-04-10 DIAGNOSIS — I2510 Atherosclerotic heart disease of native coronary artery without angina pectoris: Secondary | ICD-10-CM | POA: Diagnosis not present

## 2020-04-10 DIAGNOSIS — I351 Nonrheumatic aortic (valve) insufficiency: Secondary | ICD-10-CM

## 2020-04-10 DIAGNOSIS — K760 Fatty (change of) liver, not elsewhere classified: Secondary | ICD-10-CM

## 2020-04-10 NOTE — Patient Instructions (Addendum)
Medication Instructions:  Continue your current medications.   *If you need a refill on your cardiac medications before your next appointment, please call your pharmacy*   Lab Work: Your physician recommends that you return for lab work today: lipid panel, CMET  Our goal if for your LDL or "lousy cholesterol" to be less than 70.   If you have labs (blood work) drawn today and your tests are completely normal, you will receive your results only by: Marland Kitchen MyChart Message (if you have MyChart) OR . A paper copy in the mail If you have any lab test that is abnormal or we need to change your treatment, we will call you to review the results.   Testing/Procedures:  Your EKG today shows sinus bradycardia which is a stable finding.   Follow-Up: At [facility], you and your health needs are our priority.  As part of our continuing mission to provide you with exceptional heart care, we have created designated Provider Care Teams.  These Care Teams include your primary Cardiologist (physician) and Advanced Practice Providers (APPs -  Physician Assistants and Nurse Practitioners) who all work together to provide you with the care you need, when you need it.  We recommend signing up for the patient portal called "MyChart".  Sign up information is provided on this After Visit Summary.  MyChart is used to connect with patients for Virtual Visits (Telemedicine).  Patients are able to view lab/test results, encounter notes, upcoming appointments, etc.  Non-urgent messages can be sent to your provider as well.   To learn more about what you can do with MyChart, go to [email or web address].    Your next appointment:  In 6 months with Dr. Fletcher Anon or APP

## 2020-04-10 NOTE — Progress Notes (Signed)
Office Visit    Patient Name: Johnny Holland Date of Encounter: 04/10/2020  Primary Care Provider:  Ria Bush, MD Primary Cardiologist:  Kathlyn Sacramento, MD Electrophysiologist:  None   Chief Complaint    Johnny Holland is a 69 y.o. 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 Aortic regurgitation 10/15/2012   Echocardiogram showing calcified aortic valves, mild AS, mod AR, preserved EF 55-65%, mild LV dilation and LVH (02/2017). rec rpt 1 yr.   Marland Kitchen CAD (coronary artery disease) 01/2017   4v by CT  . Carotid stenosis 01/12/2016   Minimal on [name] (01/2016) f/u PRN   . COPD (chronic obstructive pulmonary disease) ([place]) 01/2017   mild paraseptal and centrilobular by CT  . Diverticulitis   . Diverticulosis of colon   . Duodenal ulcer   . Hepatic steatosis 01/2017   by CT  . History of smoking   . HLD (hyperlipidemia)   . Right shoulder pain    impingement with partial RTC tear ([place])  . Right-sided Bell's palsy 12/06/2018  . Thoracic aorta atherosclerosis ([place]) 01/2017   by CT   Past Surgical History:  Procedure Laterality Date  . CARDIOVASCULAR STRESS TEST  04/2018   low risk study, no ischemia  . COLONOSCOPY  11/30/06   diverticulosis; no polyps  . INCISE AND DRAIN ABCESS     Hemmorhoid  . VASECTOMY     x 2    Allergies  Allergies  Allergen Reactions  . Erythromycin     REACTION: ANAPHYLACTIC SHOCK    History of Present Illness    Johnny Holland is a 69 y.o. 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 He was last seen 09/27/2019 by Dr. Fletcher Anon via telemedicine.   Echo April  [id number] normal LV systolic function, grade 2 diastolic dysfunction, mildly calcified aortic valve without significant stenosis, mild to moderate AI and no evidence of pulmonary hypertension.  Previous CT showing LAD and left circumflex calcifications.  Nuclear stress test 2019 with small apical defect felt to represent artifact with normal LVEF.  Most recent echo June 2020 with LVEF 60-65%, moderately calcified aortic valve with mild AI and stenosis.  He underwent CTA of the coronary arteries due to worsening exertional dyspnea with calcium score 580, nondominant RCA with 50% proximal stenosis and moderate proximal LAD stenosis.  No significant lesions by FFR.  At that time he was transitioned from pravastatin to atorvastatin.  Repeat LDL greater than 70 on atorvastatin 40 mg daily and as such he was transitioned to 80 mg.  Tells me he recently retired April 1.  He is spent the last 3 weeks [place].  He is very excited about being retired.    Reports intermittent lightheadedness that lasts seconds with position changes consistent with orthostatic hypotension.  Tells me this has been ongoing for many years and happens very rarely.  We discussed orthostatic hypotension  He reports no chest pain, pressure, tightness.  Reports no shortness of breath at rest or dyspnea on exertion.  Reports no edema, orthopnea, PND.  She has with me that he quit smoking greater  than 10 years ago but has been vaping with nicotine since that time.  He notes he needs to set a quit date and is hoping to quit this year.  Tells me he has not shared this with anyone else yet.  Tells me his PCP Dr. Danise Mina has offered multiple times to help him with nicotine patches or quitting plan.  Tells me he thinks that he just needs to quit "cold [place]'.  We had a long discussion regarding quitting nicotine use, approaches, resources.  In regards to his lipid therapy, tells me his ED is actually improved after being switched  from pravastatin to atorvastatin.  His LDL was 86 on atorvastatin 40 mg.  This then improved to LDL 66 while on atorvastatin 80 mg.  However, intermittent increase in his alkaline phosphatase and GGT but consistently normal AST and ALT.  Abdominal ultrasound 02/2020 showed heterogenous echotexture of hepatic parenchyma suggesting hepatic steatosis or other diffuse hepatocellular disease and 3.4 cm infrarenal aortic aneurysm recommended for repeat ultrasound in 3 years.  Subsequent CT 02/2020 showed hepatic steatosis, aortic aneurysm recommended for repeat ultrasound in 3 years, and aortic atherosclerosis.  He has established with GI for following of his hepatic steatosis and elevated ALP and GGT.  Has upcoming colonoscopy Monday.  EKGs/Labs/Other Studies Reviewed:   The following studies were reviewed today: Cardiac CTA 08/19/19 IMPRESSION: 1. Coronary artery calcium score 580 Agatston units. This places the patient in the 85th percentile for age and gender, suggesting high risk for future cardiac events.   2.  Nondominant RCA, possible 50% proximal stenosis.   3. Possibly up to moderate (51-69%) stenosis in the proximal LAD. Will send for FFR to assess.  CT  Abdomen 03/18/20 FINDINGS: Lower chest: Minimal scarring in the lingula. Lung bases are otherwise clear. Heart size normal. No pericardial or pleural effusion. Distal esophagus is unremarkable.   Hepatobiliary: Liver is slightly decreased in attenuation diffusely and measures at the upper limits of normal in size, 17.8 cm. Liver and gallbladder are otherwise unremarkable. No biliary ductal dilatation.   Pancreas: Negative.   Spleen: Negative.   Adrenals/Urinary Tract: Adrenal glands and right kidney are unremarkable. Subcentimeter low-attenuation lesion in the left kidney is too small to characterize but statistically, a cyst is likely. Scarring in the upper pole left kidney. Visualized portions of the ureters are decompressed.    Stomach/Bowel: Stomach and visualized portions of the small bowel and colon are unremarkable.   Vascular/Lymphatic: Atherosclerotic calcification of the aorta. Infrarenal aorta measures up to 3.4 cm (sagittal image 69). No pathologically enlarged lymph nodes.   Other: No free fluid.  Mesenteries and peritoneum are unremarkable.   Musculoskeletal: Degenerative changes in the spine. No worrisome lytic or sclerotic lesions.   IMPRESSION: 1. Hepatic steatosis. 2. Aortic aneurysm NOS (ICD10-I71.9). Recommend followup by ultrasound in 3 years. This recommendation follows ACR consensus guidelines: White Paper of the ACR Incidental Findings Committee II on Vascular Findings. J Am Coll Radiol 2013; 10:789-794. 3.  Aortic atherosclerosis (ICD10-I70.0  EKG:  EKG is ordered today.  The ekg ordered today demonstrates SB 56 bpm left axis deviation and no acute ST/T wave changes.   Recent Labs: 03/03/2020: ALT 48; BUN 16; Creatinine, Ser 1.18; Potassium 4.5; Sodium 138  Recent Lipid Panel    Component Value Date/Time   CHOL 142 03/03/2020 0735   TRIG 94.0 03/03/2020 0735   HDL 57.00 03/03/2020 0735   CHOLHDL 2 03/03/2020 0735   VLDL 18.8 03/03/2020 0735   LDLCALC 66  03/03/2020 0735   LDLDIRECT 130.1 10/10/2011 0938    Home Medications   Current Meds  Medication Sig  . albuterol (PROVENTIL HFA;VENTOLIN HFA) 108 (90 Base) MCG/ACT inhaler Inhale 2 puffs into the lungs every 6 (six) hours as needed for wheezing or shortness of breath.  Marland Kitchen atorvastatin (LIPITOR) 80 MG tablet Take 1 tablet (80 mg total) by mouth daily. (Patient taking differently: Take 40 mg by mouth daily. )  . scopolamine (TRANSDERM-SCOP, 1.5 MG,) 1 MG/3DAYS Place 1 patch (1.5 mg total) onto the skin every 3 (three) days. (Patient taking differently: Place 1 patch onto the skin as needed. )  . sildenafil (VIAGRA) 100 MG tablet TAKE ONE-HALF TO ONE TABLET(50 TO 100MG  TOTAL) BY MOUTH DAILY AS NEEDED FOR ERECTILE DYSFUNCTION.  GENERIC EQUIVALENT FOR VIAGRA    Review of Systems      Review of Systems  Constitution: Negative for chills, fever and malaise/fatigue.  Cardiovascular: Negative for chest pain, dyspnea on exertion, leg swelling, near-syncope, orthopnea, palpitations and syncope.  Respiratory: Negative for cough, shortness of breath and wheezing.   Gastrointestinal: Negative for nausea and vomiting.  Neurological: Negative for dizziness, light-headedness and weakness.   All other systems reviewed and are otherwise negative except as noted above.  Physical Exam    VS:  BP 128/64 (BP Location: Left Arm, Patient Position: Sitting, Cuff Size: Normal)   Pulse (!) 56   Ht 5\' 8"  (1.727 m)   Wt 233 lb 6 oz (105.9 kg)   SpO2 97%   BMI 35.48 kg/m  , BMI Body mass index is 35.48 kg/m. GEN: Well nourished, well developed, in no acute distress. HEENT: normal. Neck: Supple, no JVD, carotid bruits, or masses. Cardiac: RRR, no rubs, or gallops. Gr 1-2/6 murmur. No clubbing, cyanosis, edema.  Radials/DP/PT 2+ and equal bilaterally.  Respiratory:  Respirations regular and unlabored, clear to auscultation bilaterally. GI: Soft, nontender, nondistended, BS + x 4. MS: No deformity or atrophy. Skin: Warm and dry, no rash. Neuro:  Strength and sensation are intact. Psych: Normal affect.  Assessment & Plan    1. CAD - Stable with no anginal symptoms.  EKG with no acute changes.  No indication for ischemic evaluation at this time.  Continue GDMT including aspirin, statin.  No beta-blocker secondary to baseline bradycardia.  2. AV disease - Echo 05/31/19 LVEF 60-65%, gr1DD, RV normal size/function, AV with mild regurgitation and stenosis.  Stable murmur appreciated on exam.  Reports no lightheadedness, chest pain, shortness of breath.  Recommend repeat echocardiogram 05/2021 for monitoring.  3. HLD - Lipid panel 03/03/20 LDL 66.  Subsequent elevation in alkaline phosphatase with normal ALT and AST led to atorvastatin  being decreased to 40 mg. Repeat lipid, direct LDL, CMET today. If LDL >70, consider addition of Zetia 10mg  daily. He is following with GI for hepatic steatosis.   4. Vapes nicotine containing substance - Importance of nicotine cessation was discussed.  Tells me he plans to quit within the year.  We discussed resources for quitting including nicotine patches, Chantix, Wellbutrin.  Tells me he plans to reach out to his primary care. Encouraged him to call our office if he needs additional assistance.   5. Hepatic steatosis - New diagnosis. Established with GI. Upcoming colonoscopy for positive iFOB.   Disposition: Follow up in 6 month(s) with Dr. Fletcher Anon or APP.    Loel Dubonnet, NP 04/10/2020, 10:47 AM

## 2020-04-13 ENCOUNTER — Encounter
Admission: RE | Disposition: A | Discharge status: Home / Self Care | Payer: Self-pay | Source: Home / Self Care | Attending: Gastroenterology

## 2020-04-13 ENCOUNTER — Ambulatory Visit: Payer: Managed Care, Other (non HMO) | Admitting: Certified Registered Nurse Anesthetist

## 2020-04-13 ENCOUNTER — Ambulatory Visit
Admission: RE | Admit: 2020-04-13 | Discharge: 2020-04-13 | Disposition: A | Discharge status: Home / Self Care | Payer: Managed Care, Other (non HMO) | Attending: Gastroenterology | Admitting: Gastroenterology

## 2020-04-13 ENCOUNTER — Encounter: Payer: Self-pay | Admitting: Gastroenterology

## 2020-04-13 ENCOUNTER — Other Ambulatory Visit: Payer: Self-pay

## 2020-04-13 ENCOUNTER — Ambulatory Visit (HOSPITAL_BASED_OUTPATIENT_CLINIC_OR_DEPARTMENT_OTHER): Payer: Medicare Other | Admitting: Vascular Surgery

## 2020-04-13 ENCOUNTER — Encounter (HOSPITAL_BASED_OUTPATIENT_CLINIC_OR_DEPARTMENT_OTHER): Payer: Self-pay

## 2020-04-13 ENCOUNTER — Ambulatory Visit: Payer: Medicare Other | Attending: Orthopaedic Surgery of the Spine | Admitting: Vascular Surgery

## 2020-04-13 ENCOUNTER — Encounter (HOSPITAL_BASED_OUTPATIENT_CLINIC_OR_DEPARTMENT_OTHER): Payer: Self-pay | Admitting: Vascular Surgery

## 2020-04-13 ENCOUNTER — Telehealth (HOSPITAL_BASED_OUTPATIENT_CLINIC_OR_DEPARTMENT_OTHER): Payer: Self-pay | Admitting: Vascular Surgery

## 2020-04-13 ENCOUNTER — Encounter (HOSPITAL_COMMUNITY): Payer: Self-pay

## 2020-04-13 VITALS — BP 131/70 | HR 49 | Temp 98.1°F | Resp 20 | Ht 67.0 in | Wt 212.2 lb

## 2020-04-13 DIAGNOSIS — K5730 Diverticulosis of large intestine without perforation or abscess without bleeding: Secondary | ICD-10-CM | POA: Diagnosis not present

## 2020-04-13 DIAGNOSIS — Z87891 Personal history of nicotine dependence: Secondary | ICD-10-CM | POA: Insufficient documentation

## 2020-04-13 DIAGNOSIS — K760 Fatty (change of) liver, not elsewhere classified: Secondary | ICD-10-CM | POA: Insufficient documentation

## 2020-04-13 DIAGNOSIS — Z79899 Other long term (current) drug therapy: Secondary | ICD-10-CM | POA: Insufficient documentation

## 2020-04-13 DIAGNOSIS — K644 Residual hemorrhoidal skin tags: Secondary | ICD-10-CM | POA: Insufficient documentation

## 2020-04-13 DIAGNOSIS — N529 Male erectile dysfunction, unspecified: Secondary | ICD-10-CM | POA: Diagnosis not present

## 2020-04-13 DIAGNOSIS — I2510 Atherosclerotic heart disease of native coronary artery without angina pectoris: Secondary | ICD-10-CM | POA: Insufficient documentation

## 2020-04-13 DIAGNOSIS — Z7982 Long term (current) use of aspirin: Secondary | ICD-10-CM | POA: Diagnosis not present

## 2020-04-13 DIAGNOSIS — Z1211 Encounter for screening for malignant neoplasm of colon: Secondary | ICD-10-CM

## 2020-04-13 DIAGNOSIS — D128 Benign neoplasm of rectum: Secondary | ICD-10-CM | POA: Insufficient documentation

## 2020-04-13 DIAGNOSIS — Z8711 Personal history of peptic ulcer disease: Secondary | ICD-10-CM | POA: Insufficient documentation

## 2020-04-13 DIAGNOSIS — E785 Hyperlipidemia, unspecified: Secondary | ICD-10-CM | POA: Insufficient documentation

## 2020-04-13 DIAGNOSIS — K635 Polyp of colon: Secondary | ICD-10-CM

## 2020-04-13 DIAGNOSIS — K621 Rectal polyp: Secondary | ICD-10-CM | POA: Diagnosis not present

## 2020-04-13 DIAGNOSIS — J449 Chronic obstructive pulmonary disease, unspecified: Secondary | ICD-10-CM | POA: Diagnosis not present

## 2020-04-13 DIAGNOSIS — Z8249 Family history of ischemic heart disease and other diseases of the circulatory system: Secondary | ICD-10-CM | POA: Diagnosis not present

## 2020-04-13 DIAGNOSIS — D124 Benign neoplasm of descending colon: Secondary | ICD-10-CM | POA: Diagnosis not present

## 2020-04-13 DIAGNOSIS — Z881 Allergy status to other antibiotic agents status: Secondary | ICD-10-CM | POA: Diagnosis not present

## 2020-04-13 DIAGNOSIS — M7989 Other specified soft tissue disorders: Secondary | ICD-10-CM | POA: Insufficient documentation

## 2020-04-13 HISTORY — PX: COLONOSCOPY WITH PROPOFOL: SHX5780

## 2020-04-13 SURGERY — COLONOSCOPY WITH PROPOFOL
Anesthesia: General

## 2020-04-13 MED ORDER — LIDOCAINE HCL (CARDIAC) PF 100 MG/5ML IV SOSY
PREFILLED_SYRINGE | INTRAVENOUS | Status: DC | PRN
  Administered 2020-04-13: 50 mg via INTRAVENOUS

## 2020-04-13 MED ORDER — PROPOFOL 10 MG/ML IV BOLUS
INTRAVENOUS | Status: DC | PRN
  Administered 2020-04-13: 40 mg via INTRAVENOUS
  Administered 2020-04-13: 150 ug/kg/min via INTRAVENOUS

## 2020-04-13 MED ORDER — PROPOFOL 500 MG/50ML IV EMUL
INTRAVENOUS | Status: AC
  Filled 2020-04-13: qty 50

## 2020-04-13 MED ORDER — SODIUM CHLORIDE (PF) 0.9 % IJ SOLN
INTRAMUSCULAR | Status: DC | PRN
  Administered 2020-04-13: 6 mL

## 2020-04-13 MED ORDER — PHENYLEPHRINE HCL (PRESSORS) 10 MG/ML IV SOLN
INTRAVENOUS | Status: DC | PRN
  Administered 2020-04-13: 50 ug via INTRAVENOUS
  Administered 2020-04-13: 100 ug via INTRAVENOUS
  Administered 2020-04-13: 50 ug via INTRAVENOUS

## 2020-04-13 MED ORDER — PROPOFOL 10 MG/ML IV BOLUS
INTRAVENOUS | Status: AC
  Filled 2020-04-13: qty 20

## 2020-04-13 MED ORDER — GLYCOPYRROLATE 0.2 MG/ML IJ SOLN
INTRAMUSCULAR | Status: DC | PRN
  Administered 2020-04-13: .2 mg via INTRAVENOUS

## 2020-04-13 MED ORDER — SODIUM CHLORIDE 0.9 % IV SOLN: INTRAVENOUS | Status: DC

## 2020-04-13 NOTE — H&P (Signed)
Johnny Darby, MD [address]  [place], [place] [address]  Main: [phone]  Fax: [phone] Pager: [phone]  Primary Care Physician:  Ria Bush, MD Primary Gastroenterologist:  Dr. Cephas Holland  Pre-Procedure History & Physical: HPI:  Johnny Holland is a 69 y.o. male is here for an colonoscopy.   Past Medical History:  Diagnosis Date   Aortic regurgitation 10/15/2012   Echocardiogram showing calcified aortic valves, mild AS, mod AR, preserved EF 55-65%, mild LV dilation and LVH (02/2017). rec rpt 1 yr.    CAD (coronary artery disease) 01/2017   4v by CT   Carotid stenosis 01/12/2016   Minimal on [name] (01/2016) f/u PRN    COPD (chronic obstructive pulmonary disease) ([place]) 01/2017   mild paraseptal and centrilobular by CT   Diverticulitis    Diverticulosis of colon    Duodenal ulcer    Hepatic steatosis 01/2017   by CT   History of smoking    HLD (hyperlipidemia)    Right shoulder pain    impingement with partial RTC tear ([place])   Right-sided Bell's palsy 12/06/2018   Thoracic aorta atherosclerosis ([place]) 01/2017   by CT    Past Surgical History:  Procedure Laterality Date   CARDIOVASCULAR STRESS TEST  04/2018   low risk study, no ischemia   COLONOSCOPY  11/30/06   diverticulosis; no polyps   INCISE AND DRAIN ABCESS     Hemmorhoid   VASECTOMY     x 2    Prior to Admission medications   Medication Sig Start Date End Date Taking? Authorizing Provider  albuterol (PROVENTIL HFA;VENTOLIN HFA) 108 (90 Base) MCG/ACT inhaler Inhale 2 puffs into the lungs every 6 (six) hours as needed for wheezing or shortness of breath. 11/21/18   Jearld Fenton, NP  aspirin 81 MG tablet Take 1 tablet (81 mg total) by mouth daily. Patient not taking: Reported on 04/10/2020 02/27/18   Ria Bush, MD  atorvastatin (LIPITOR) 80 MG tablet Take 1 tablet (80 mg total) by mouth daily. Patient taking differently: Take 40 mg by mouth daily.  11/01/19 04/10/20   Rise Mu, PA-C  Multiple Vitamin (MULTI-VITAMIN DAILY PO) Take by mouth.    [provider]  scopolamine (TRANSDERM-SCOP, 1.5 MG,) 1 MG/3DAYS Place 1 patch (1.5 mg total) onto the skin every 3 (three) days. Patient taking differently: Place 1 patch onto the skin as needed.  07/31/17   Ria Bush, MD  sildenafil (VIAGRA) 100 MG tablet TAKE ONE-HALF TO ONE TABLET(50 TO 100MG  TOTAL) BY MOUTH DAILY AS NEEDED FOR ERECTILE DYSFUNCTION. GENERIC EQUIVALENT FOR VIAGRA 01/20/20   Ria Bush, MD    Allergies as of 03/23/2020 - Review Complete 03/18/2020  Allergen Reaction Noted   Erythromycin  08/21/2007    Family History  Problem Relation Age of Onset   Cirrhosis Father        + alcohol   Heart failure Father    Emphysema Father        + smoker   Heart attack Father 65       x 4   Alcohol abuse Father    COPD Mother        + smoker   Macular degeneration Mother    Cataracts Mother        Bilateral--removed   Diabetes Paternal Grandmother    Breast cancer Maternal Aunt    Depression Sister    Alcohol abuse Sister    Alzheimer's disease Other  Aunt    Social History   Socioeconomic History   Marital status: Married    Spouse name: Not on file   Number of children: 3   Years of education: Not on file   Highest education level: Not on file  Occupational History   Occupation: Runner, broadcasting/film/video: [employer]  Tobacco Use   Smoking status: Former Smoker    Packs/day: 1.00    Years: 42.00    Pack years: 42.00    Quit date: 08/13/2011    Years since quitting: 8.6   Smokeless tobacco: Never Used   Tobacco comment: Using electronic cigarettes now  Substance and Sexual Activity   Alcohol use: Yes    Comment: Social   Drug use: No   Sexual activity: Not on file  Other Topics Concern   Not on file  Social History Narrative   Married; lives with wife   1 daughter, 2 sons; 2 grandchildren   Investment banker, 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)   Activity: maintains 3 properties   Diet: poor diet, seldom water, occasional fruits/vegetables   Social Determinants of Radio broadcast assistant Strain:    Difficulty of Paying Living Expenses:   Food Insecurity:    Worried About Charity fundraiser in the Last Year:    Arboriculturist in the Last Year:   Transportation Needs:    Film/video editor (Medical):    Lack of Transportation (Non-Medical):   Physical Activity:    Days of Exercise per Week:    Minutes of Exercise per Session:   Stress:    Feeling of Stress :   Social Connections:    Frequency of Communication with Friends and Family:    Frequency of Social Gatherings with Friends and Family:    Attends Religious Services:    Active Member of Clubs or Organizations:    Attends Music therapist:    Marital Status:   Intimate Partner Violence:    Fear of Current or Ex-Partner:    Emotionally Abused:    Physically Abused:    Sexually Abused:     Review of Systems: See HPI, otherwise negative ROS  Physical Exam: BP 125/74   Pulse (!) 59   Temp (!) 96.6 F (35.9 C) (Temporal)   Resp 17   Ht 5\' 8"  (1.727 m)   Wt 224 lb (101.6 kg)   SpO2 95%   BMI 34.06 kg/m  General:   Alert,  pleasant and cooperative in NAD Head:  Normocephalic and atraumatic. Neck:  Supple; no masses or thyromegaly. Lungs:  Clear throughout to auscultation.    Heart:  Regular rate and rhythm. Abdomen:  Soft, nontender and nondistended. Normal bowel sounds, without guarding, and without rebound.   Neurologic:  Alert and  oriented x4;  grossly normal neurologically.  Impression/Plan: Johnny Holland is here for an colonoscopy to be performed for colon cancer screening  Risks, benefits, limitations, and alternatives regarding  colonoscopy have been reviewed with the patient.  Questions have been answered.  All parties agreeable.   Sherri Sear, MD  04/13/2020, 10:16 AM

## 2020-04-13 NOTE — Op Note (Signed)
[facility] Gastroenterology Patient Name: Johnny Holland Procedure Date: 04/13/2020 10:18 AM MRN: [id number] Account #: [id number] Date of Birth: Jul 24, 1951 Admit Type: Outpatient Age: 69 Room: [facility] ENDO ROOM 1 Gender: Male Note Status: Finalized Procedure:             Colonoscopy Indications:           Screening for colorectal malignant neoplasm Providers:             Lin Landsman MD, MD Medicines:             Monitored Anesthesia Care Complications:         No immediate complications. Estimated blood loss:                         Minimal. Procedure:             Pre-Anesthesia Assessment:                        - Prior to the procedure, a History and Physical was                         performed, and patient medications and allergies were                         reviewed. The patient is competent. The risks and                         benefits of the procedure and the sedation options and                         risks were discussed with the patient. All questions                         were answered and informed consent was obtained.                         Patient identification and proposed procedure were                         verified by the physician, the nurse, the                         anesthesiologist, the anesthetist and the technician                         in the pre-procedure area in the procedure room in the                         endoscopy suite. Mental Status Examination: alert and                         oriented. Airway Examination: normal oropharyngeal                         airway and neck mobility. Respiratory Examination:                         clear to auscultation. CV Examination: normal.  Prophylactic Antibiotics: The patient does not require                         prophylactic antibiotics. Prior Anticoagulants: The                         patient has taken no previous anticoagulant or           antiplatelet agents. ASA Grade Assessment: II - A                         patient with mild systemic disease. After reviewing                         the risks and benefits, the patient was deemed in                         satisfactory condition to undergo the procedure. The                         anesthesia plan was to use monitored anesthesia care                         (MAC). Immediately prior to administration of                         medications, the patient was re-assessed for adequacy                         to receive sedatives. The heart rate, respiratory                         rate, oxygen saturations, blood pressure, adequacy of                         pulmonary ventilation, and response to care were                         monitored throughout the procedure. The physical                         status of the patient was re-assessed after the                         procedure.                        After obtaining informed consent, the colonoscope was                         passed under direct vision. Throughout the procedure,                         the patient's blood pressure, pulse, and oxygen                         saturations were monitored continuously. The                         Colonoscope was  introduced through the anus and                         advanced to the the cecum, identified by appendiceal                         orifice and ileocecal valve. The colonoscopy was                         performed without difficulty. The patient tolerated                         the procedure well. The quality of the bowel                         preparation was adequate to identify polyps 6 mm and                         larger in size. Findings:      The perianal and digital rectal examinations were normal. Pertinent       negatives include normal sphincter tone and no palpable rectal lesions.      A 7 mm polyp was found in the descending colon. The polyp was  flat.       Preparations were made for mucosal resection. Saline was injected to       raise the lesion. Snare mucosal resection was performed. Resection and       retrieval were complete.      A 6 mm polyp was found in the rectum. The polyp was sessile. The polyp       was removed with a cold snare. Resection and retrieval were complete.      Multiple diverticula were found in the left colon.      Non-bleeding external hemorrhoids were found during retroflexion. The       hemorrhoids were medium-sized. Impression:            - One 7 mm polyp in the descending colon, removed with                         mucosal resection. Resected and retrieved.                        - One 6 mm polyp in the rectum, removed with a cold                         snare. Resected and retrieved.                        - Diverticulosis in the left colon.                        - Non-bleeding external hemorrhoids.                        - Mucosal resection was performed. Resection and                         retrieval were complete. Recommendation:        - Discharge patient to home (with escort).                        -  Resume previous diet today.                        - Continue present medications.                        - Await pathology results.                        - Repeat colonoscopy in 5 years for surveillance. Procedure Code(s):     --- Professional ---                        [phone], Colonoscopy, flexible; with endoscopic mucosal                         resection                        [id number], 79, Colonoscopy, flexible; with removal of                         tumor(s), polyp(s), or other lesion(s) by snare                         technique Diagnosis Code(s):     --- Professional ---                        Z12.11, Encounter for screening for malignant neoplasm                         of colon                        K63.5, Polyp of colon                        K62.1, Rectal polyp                         K57.30, Diverticulosis of large intestine without                         perforation or abscess without bleeding                        K64.4, Residual hemorrhoidal skin tags CPT copyright 2019 American Medical Association. All rights reserved. The codes documented in this report are preliminary and upon coder review may  be revised to meet current compliance requirements. Dr. Ulyess Mort Lin Landsman MD, MD 04/13/2020 10:43:05 AM This report has been signed electronically. Number of Addenda: 0 Note Initiated On: 04/13/2020 10:18 AM Scope Withdrawal Time: 0 hours 14 minutes 26 seconds  Total Procedure Duration: 0 hours 17 minutes 42 seconds  Estimated Blood Loss:  Estimated blood loss was minimal.      [facility]

## 2020-04-13 NOTE — Transfer of Care (Signed)
Immediate Anesthesia Transfer of Care Note  Patient: Johnny Holland  Procedure(s) Performed: COLONOSCOPY WITH PROPOFOL (N/A )  Patient Location: PACU  Anesthesia Type:General  Level of Consciousness: awake, alert  and oriented  Airway & Oxygen Therapy: Patient Spontanous Breathing and Patient connected to nasal cannula oxygen  Post-op Assessment: Report given to RN and Post -op Vital signs reviewed and stable  Post vital signs: Reviewed and stable  Last Vitals:  Vitals Value Taken Time  BP    Temp    Pulse    Resp    SpO2      Last Pain:  Vitals:   04/13/20 0947  TempSrc: Temporal  PainSc: 0-No pain         Complications: No apparent anesthesia complications

## 2020-04-13 NOTE — Anesthesia Preprocedure Evaluation (Addendum)
Anesthesia Evaluation  Patient identified by MRN, date of birth, ID band Patient awake    Reviewed: Allergy & Precautions, H&P , NPO status , Patient's Chart, lab work & pertinent test results  Airway Mallampati: III  TM Distance: >3 FB Neck ROM: full    Dental   Pulmonary COPD, former smoker,    breath sounds clear to auscultation       Cardiovascular (-) angina+ CAD  (-) dysrhythmias + Valvular Problems/Murmurs AS and AI  Rhythm:regular Rate:Normal  Carotid stenosis   Neuro/Psych negative neurological ROS  negative psych ROS   GI/Hepatic Neg liver ROS, PUD,   Endo/Other  negative endocrine ROS  Renal/GU negative Renal ROS  negative genitourinary   Musculoskeletal   Abdominal   Peds  Hematology negative hematology ROS (+)   Anesthesia Other Findings Past Medical History: 10/15/2012: Aortic regurgitation     Comment:  Echocardiogram showing calcified aortic valves, mild AS,              mod AR, preserved EF 55-65%, mild LV dilation and LVH               (02/2017). rec rpt 1 yr.  01/2017: CAD (coronary artery disease)     Comment:  4v by CT 01/12/2016: Carotid stenosis     Comment:  Minimal on [name] (01/2016) f/u PRN  01/2017: COPD (chronic obstructive pulmonary disease) ([place])     Comment:  mild paraseptal and centrilobular by CT No date: Diverticulitis No date: Diverticulosis of colon No date: Duodenal ulcer 01/2017: Hepatic steatosis     Comment:  by CT No date: History of smoking No date: HLD (hyperlipidemia) No date: Right shoulder pain     Comment:  impingement with partial RTC tear Mardelle Matte) 12/06/2018: Right-sided Bell's palsy 01/2017: Thoracic aorta atherosclerosis ([place])     Comment:  by CT  Past Surgical History: 04/2018: CARDIOVASCULAR STRESS TEST     Comment:  low risk study, no ischemia 11/30/06: COLONOSCOPY     Comment:  diverticulosis; no polyps No date: INCISE AND DRAIN ABCESS     Comment:   Hemmorhoid No date: VASECTOMY     Comment:  x 2     Reproductive/Obstetrics negative OB ROS                            Anesthesia Physical Anesthesia Plan  ASA: III  Anesthesia Plan: General   Post-op Pain Management:    Induction:   PONV Risk Score and Plan: Propofol infusion and TIVA  Airway Management Planned: Natural Airway and Nasal Cannula  Additional Equipment:   Intra-op Plan:   Post-operative Plan:   Informed Consent: I have reviewed the patients History and Physical, chart, labs and discussed the procedure including the risks, benefits and alternatives for the proposed anesthesia with the patient or authorized representative who has indicated his/her understanding and acceptance.     Dental Advisory Given  Plan Discussed with: Anesthesiologist  Anesthesia Plan Comments:         Anesthesia Quick Evaluation

## 2020-04-13 NOTE — Patient Instructions (Addendum)
We have ordered ultrasound of your legs. Please get it done at your earliest convenience.     We have ordered 15-20 mmHg compression stockings for you.  A referral to lymphedema clinic has been made. Please call them to set up an appointment.     Please go to one of there medical supply stores for compression stockings.    [facility] [employer]P[employer] COMPRESSION GARMENTS    * [place]    [address]   [place], [place] [address]    [place], [place] [address]  PH: [phone]     PH: [phone]   [place] [address]    [address]  [place], [place] [address]    [place], [place] [address]  PH: [phone]     PH: [phone]      * [facility]     [facility]         SUPPLY    [phone]    [address]  [place], [place] [address]     [place], [place] [address]  [place]: [phone]     PH: [phone]            [facility]    [place], [place] [address]    [place], [place] [address]  [place]: [phone]     [place]: [phone]      [facility]    [place]   [address]  [place], [place] [address]    [place], [place] [address]   [place]: [phone]     PH: [phone]        [facility]    [address]    [address] [place], [place] [address]    [place], [place] [address]  [place]: [phone]     [place]: [phone]        [place] [address]     [place], [place] [address]    [place], [place] [address]  [place]: [phone]     [place]: [phone]                [place]   [facility]    [address]   [place], [place] [address]    [place], [place] [address]  [place]: [phone]     [place]: [phone]    [place]    [place], [place] [address]    [place], [place] [address]  [place]: [phone]     [place]: [phone]          [place]    [address]  [place] [address]  [place]: [phone]        [place]  [place], [place] [address]    [place], [place] [address]  [place]: [phone]     [place]: [phone]               * [address], [address]    [address]  [place], [place] [address]    [place], [place] [address]  [place]: [phone]     [place]: [phone]          [place]    [place]    [place], [place] [address]    [place], [place] [address]  PH: [phone]      PH: [phone]            PLEASE NOTE, COMPRESSION STOCKING CAN ALSO BE ORDER ONLINE AT:  [email or web address]  [email or web address]  [email or web address]  [email or web address]   [email or web address] (10% coupon code: [facility])      * CERTIFIED IN CUSTOM COMPRESSION GARMENTS    * ALSO [employer] UPPER EXTREMITY LYMPHEDEMA PRODUCTS OR SPECIALIZED IN LYMPHEDEMA GARMENTS.

## 2020-04-13 NOTE — Progress Notes (Signed)
Date: April 13, 2020   Patient Name: Stephen [name]   Medical Record #: [id number]   DOB: [birth year]  Age: 69 year old  Sex: male    Referring MD:  Zlomislic, Vinko, MD  [id number] [place] [address]    Reason for Visit  Chief Complaint   Patient presents with    New Patient        History of Present Illness:     Stephen [name] is a 69 year old male w/ spondylolisthesis s/p PLIF and XLIF 09/06/19 who is here for calf pain and ankle swelling.     He reports 30-40 years of calf pain/leg swelling, feels as though legs are heavy and has been especially uncomfortable in the last few months. Pain in calves is worse when lying down, doesn't help when elevated. This pain has gotten worse so he came in today. Started getting cramping in the last few weeks, mainly when lying down. Hasn't tried compression socks.    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 Referred to lymphedema clinic, didn't go because they didn't call him.    Past Medical History  Past Medical History:   Diagnosis Date    Chronic suprapubic catheter (CMS-HCC)     Congenital hydronephrosis     Gout     Headache     Hematuria     Kidney disease     Kidney stones     Major depressive disorder, single episode     Polyarthropathy or polyarthritis of multiple sites     Retinal detachment     Urethral stricture    HTN  No CHF, h/o MI    Past Surgical History  Past Surgical History:   Procedure Laterality Date    CT INSERTION OF SUPRAPUBIC CATH  09/25/2015    NEPHRECTOMY Right 1995    APPENDECTOMY      COLONOSCOPY      CYSTOSCOPY      CYSTOSCOPY W/ LASER LITHOTRIPSY      OTHER SURGICAL HISTORY      Interstim 01/29/2011    TRANSURETHRAL RESECTION OF PROSTATE       XLIF L3-L5, Laminectomy and PSIF L3-L5 09/06/2019    Lumbar transforaminal epidural steroid injection 04/08/20  Sacroiliac joint injection 03/24/20    Allergies  Allergies   Allergen  Reactions    Amoxicillin Rash    Sulfa Drugs Unspecified     Does patient have allergy to contrast or iodine - No    Medications  Current Outpatient Medications   Medication Sig Dispense Refill    acetaminophen (TYLENOL) 325 MG tablet Take 2 tablets (650 mg) by mouth every 6 hours as needed for Mild Pain (Pain Score 1-3). 90 tablet 0    ALAWAY 0.025 % ophthalmic solution INSTILL 1 DROP INTO BOTH EYES TWICE A DAY AS DIRECTED      allopurinol (ZYLOPRIM) 300 MG tablet Take 300 mg by mouth daily.      amLODIPINE (NORVASC) 5 MG tablet Take 10 mg by mouth daily.      atenolol (TENORMIN) 25 MG tablet Take 25 mg by mouth daily.      Baclofen 5 MG TABS Take 1 tablet by mouth 3 times daily as needed (spasms). 50 tablet 0    benazepril-hydrochlorthiazide (LOTENSIN HCT) 20-25 MG tablet Take 1 tablet by mouth daily.      Cetirizine HCl (ZYRTEC PO) 10 mg.  ciclopirox (LOPROX) 0.77 % GEL Apply topically 2 times daily.      docusate sodium (COLACE) 250 MG capsule Take 1 capsule (250 mg) by mouth 2 times daily as needed for Constipation. 60 capsule 0    ferrous sulfate 325 (65 Fe) MG tablet Take 325 mg by mouth 3 times daily.      gabapentin (NEURONTIN) 300 MG capsule Take 1 capsule (300 mg) by mouth 3 times daily. 90 capsule 0    omeprazole (PRILOSEC) 20 MG capsule Take 20 mg by mouth daily.      oxyCODONE (ROXICODONE) 5 MG immediate release tablet Take 1 tablet (5 mg) by mouth every 8 hours as needed for Severe Pain (Pain Score 7-10). 15 tablet 0    Polyethyl Glycol-Propyl Glycol (SYSTANE ULTRA OP)       senna (SENOKOT) 8.6 MG tablet Take 2 tablets (17.2 mg) by mouth every morning as needed for Constipation. 30 tablet 0    Terbinafine HCl (LAMISIL PO) Take by mouth daily.      triamcinolone (KENALOG) 0.1 % cream Apply 1 Application topically 2 times daily. Apply a thin layer as directed      VITAMIN D PO Take 500 mg by mouth daily.        No current facility-administered medications for this visit.           Social History  Social History     Socioeconomic History    Marital status: Single     Spouse name: Not on file    Number of children: Not on file    Years of education: Not on file    Highest education level: Not on file   Occupational History    Not on file   Tobacco Use    Smoking status: Never Smoker    Smokeless tobacco: Never Used   Substance and Sexual Activity    Alcohol use: Not Currently    Drug use: Not Currently    Sexual activity: Never   Social Activities of Daily Living Present    Not on file   Social History Narrative    Not on file     Family History  Family History   Adopted: Yes   Family history unknown: Yes       Review of Systems    Pertinent items are noted in HPI. denies chest pain, Abdominal pain, SOB. Able to walk.    Physical Exam  Patient is a 69 year old male who appeared alert  BP 131/70 (BP Location: Left arm, BP Patient Position: Sitting, BP cuff size: Regular)    Pulse (!) 49    Temp 98.1 F (36.7 C) (Temporal)    Resp 20    Ht 5\' 7"  (1.702 m)    Wt 96.3 kg (212 lb 3.2 oz)    SpO2 97%    BMI 33.24 kg/m   Body mass index is 33.24 kg/m.  General Appearance: healthy, alert, no distress, pleasant affect, cooperative.  Heart:  rrr.  Lungs: non-labored on room air.  Abdomen: Abdomen soft, non-tender. No palpable masses  Extremities:  no cyanosis, clubbing, or edema, distal pulses normal and 1+ peripheral edema at ankles bilaterally. DP/PT palpable bilaterally.     Medical Decision Making  Test Results:  Results for orders placed or performed in visit on 04/05/20   COVID-19 Coronavirus Detection Assay at [facility]   Result Value Ref Range    COVID-19 Source Nasal     COVID-19 Coronavirus Result Not Detected  No new imaging    Assessment/Plan  Stephen [name] is a 69 year old male w/ spondylolisthesis s/p PLIF and XLIF 09/06/19 who is here for chronic leg pain and swelling. Unlikely due to arterial source due to normal pulses. Will assess for venous insufficiency with  ultrasound. Possibly due to nerve damage from lumbar spine disease    - 15-20 mmHg Compression stockings  - U/S Venous duplex bilat. LE to assess for venous insufficiency  - Refer to lymphedema clinic    Patient seen and evaluated with Dr. [name], [place]

## 2020-04-13 NOTE — Progress Notes (Signed)
I have personally seen Lovena Le with [facility].  I agree with the note written by the nurse practioner/resident/medical student.  The patient presents with a history of long standing b/l LE heaviness, mild swelling. Known Lumbar spine disease.  Pertinent physical exam findings are palpable pulses, no VV, mild edema.  Radiologic exams show outside duplex with mild GSV reflux/perforators.  My assessment is leg heavines.  My plan for care is repeat duplex for reflux, light prescription garment, has appt with lymphedema clinic.    Return to clinic as needed .    Betsey Amen MD, FACS  Professor of Surgery  [facility]

## 2020-04-13 NOTE — Telephone Encounter (Signed)
Filled out fitting form and faxed to [facility]. They will do the measurements. Called and left msg with pt to contact [facility]. Gave phone number.    Darrall Dears RN, Clinic Nurse   Vascular and Endovascular Surgery  [place] [facility] [place]

## 2020-04-13 NOTE — Telephone Encounter (Signed)
Faxed compression stocking DME order, face sheet, progress note to [facility]. Pt has not had US done with [name] yet. Scheduled 5/4.    Darrall Dears RN, Clinic Nurse   Vascular and Endovascular Surgery  [place] [facility] [place]

## 2020-04-14 ENCOUNTER — Encounter: Payer: Self-pay | Admitting: *Deleted

## 2020-04-14 LAB — LIPID PANEL
Chol/HDL Ratio: 2.4 ratio (ref 0.0–5.0)
Cholesterol, Total: 141 mg/dL (ref 100–199)
HDL: 60 mg/dL (ref >39)
LDL Chol Calc (NIH): 59 mg/dL (ref 0–99)
Triglycerides: 124 mg/dL (ref 0–149)
VLDL Cholesterol Cal: 22 mg/dL (ref 5–40)

## 2020-04-14 LAB — COMPREHENSIVE METABOLIC PANEL
ALT: 37 IU/L (ref 0–44)
AST: 29 IU/L (ref 0–40)
Albumin/Globulin Ratio: 1.4 (ref 1.2–2.2)
Albumin: 4 g/dL (ref 3.8–4.8)
Alkaline Phosphatase: 230 IU/L — ABNORMAL HIGH (ref 39–117)
BUN/Creatinine Ratio: 11 (ref 10–24)
BUN: 12 mg/dL (ref 8–27)
Bilirubin Total: 0.5 mg/dL (ref 0.0–1.2)
CO2: 22 mmol/L (ref 20–29)
Calcium: 8.9 mg/dL (ref 8.6–10.2)
Chloride: 107 mmol/L — ABNORMAL HIGH (ref 96–106)
Creatinine, Ser: 1.06 mg/dL (ref 0.76–1.27)
GFR calc Af Amer: 84 mL/min/{1.73_m2} (ref >59)
GFR calc non Af Amer: 73 mL/min/{1.73_m2} (ref >59)
Globulin, Total: 2.8 g/dL (ref 1.5–4.5)
Glucose: 111 mg/dL — ABNORMAL HIGH (ref 65–99)
Potassium: 4.6 mmol/L (ref 3.5–5.2)
Sodium: 141 mmol/L (ref 134–144)
Total Protein: 6.8 g/dL (ref 6.0–8.5)

## 2020-04-14 LAB — LDL CHOLESTEROL, DIRECT: LDL Direct: 65 mg/dL (ref 0–99)

## 2020-04-14 LAB — SURGICAL PATHOLOGY

## 2020-04-14 NOTE — Anesthesia Postprocedure Evaluation (Signed)
Anesthesia Post Note  Patient: Johnny Holland  Procedure(s) Performed: COLONOSCOPY WITH PROPOFOL (N/A )  Patient location during evaluation: PACU Anesthesia Type: General Level of consciousness: awake and alert Pain management: pain level controlled Vital Signs Assessment: post-procedure vital signs reviewed and stable Respiratory status: spontaneous breathing, nonlabored ventilation and respiratory function stable Cardiovascular status: blood pressure returned to baseline and stable Postop Assessment: no apparent nausea or vomiting Anesthetic complications: no     Last Vitals:  Vitals:   04/13/20 1050 04/13/20 1100  BP: (!) 98/55 121/64  Pulse: (!) 50 (!) 50  Resp: 18 13  Temp:    SpO2: 95% 97%    Last Pain:  Vitals:   04/13/20 1040  TempSrc: Temporal  PainSc:                  Tera Mater

## 2020-04-15 ENCOUNTER — Encounter: Payer: Self-pay | Admitting: Family Medicine

## 2020-04-15 ENCOUNTER — Encounter: Payer: Self-pay | Admitting: Gastroenterology

## 2020-04-18 ENCOUNTER — Other Ambulatory Visit: Payer: Self-pay

## 2020-04-21 ENCOUNTER — Ambulatory Visit
Admission: RE | Admit: 2020-04-21 | Discharge: 2020-04-21 | Disposition: A | Discharge status: Home / Self Care | Payer: Medicare Other | Attending: Registered Nurse | Admitting: Registered Nurse

## 2020-04-21 DIAGNOSIS — I872 Venous insufficiency (chronic) (peripheral): Secondary | ICD-10-CM

## 2020-04-21 DIAGNOSIS — M7989 Other specified soft tissue disorders: Secondary | ICD-10-CM | POA: Insufficient documentation

## 2020-04-22 ENCOUNTER — Other Ambulatory Visit: Payer: Self-pay

## 2020-04-28 ENCOUNTER — Ambulatory Visit: Payer: Medicare Other | Attending: Registered Nurse | Admitting: Rehabilitative and Restorative Service Providers"

## 2020-04-28 DIAGNOSIS — M7989 Other specified soft tissue disorders: Secondary | ICD-10-CM

## 2020-04-28 DIAGNOSIS — R600 Localized edema: Secondary | ICD-10-CM | POA: Insufficient documentation

## 2020-04-28 NOTE — Interdisciplinary (Signed)
Occupational Therapy Evaluation and Discharge      Current Functional Status        Lymphedema Life Impact Scale Evaluation Progress Note Discharge         1. Amount of pain associated w/ my lymphedema is 3     2. Amount of limb heaviness associated w/ my lymphedema is 4     3. Amount of skin tightness associated w/ my lymphedema is 3     4. The size of my swollen limb(s) seems 2     5. Lymphedema affects the movement of my swollen limb(s) 3     6. The strength in my swollen limb(s) is 3     7. Lymphedema affects my body image (how I think I look) 0     8. Lymphedema affects my socializing with others  0     9. Lymphedema affects my intimate relations w/ spouse or partner 0     10. Lymphedema "gets me down" (i.e., feelings of depression, frustration, or anger due to the lymphedema) 1     11. I must rely on others for help due to my lymphedema 0     12. I know what to do to manage my lymphedema 2     13. Lymphedema affects my abilities to perform self-care activities (i.e, eating, dressing, hygiene) 1     14. Lymphedema affects my ability to perform routine home/work-related activities  2     15. Lymphedema affects my performance of preferred leisure activities 3     16. Lymphedema affects the proper fit of clothing/shoes 1     17. Lymphedema affects my sleep 4     18. In the past year, I have become ill with an infection in my swollen limb requiring oral antibiotics or hospitalization 0     LLIS score 32 0 0   Percent of impairment  [id number] 0 0            Ordering Physician: Alcide Evener R         Outpatient Diagnosis     ICD-10-CM ICD-9-CM    1. Edema leg  R60.0 782.3    2. Leg swelling  M79.89 729.81        Preferred Language:English       Patient History   Medical History  Dominant side: Right  History of presenting condition: This 69 y/o male reports h/o "heavy calves" w/ pain in legs all his life. Pt has h/o spondylilothesisis and is s/p PLIF and XLIF 09/06/19. Pt had [name] which was (-) for DVT and (-)  for VI. Pt was measured for 15-20 mmHg compression stockings and is waiting for them to arrive at [facility] in [place]. Pt unsure of past family hx, as he was adopted. Pt is referred to OT for eval & tx.  Surgery/Procedure affecting therapy: PLIF, XLIP  Date of Surgery/Procedure: 09/06/19  Additional past medical history comments: 1 kidney- removed as a toddler.  Medications affecting therapy : No significant medications  Fall History: No reported falls in the last 6 months  Past Medical History:   Diagnosis Date    Chronic suprapubic catheter (CMS-HCC)     Congenital hydronephrosis     Gout     Headache     Hematuria     Kidney disease     Kidney stones     Major depressive disorder, single episode     Polyarthropathy or polyarthritis of multiple sites  Retinal detachment     Urethral stricture      Past Surgical History:   Procedure Laterality Date    CT INSERTION OF SUPRAPUBIC CATH  09/25/2015    NEPHRECTOMY Right 1995    APPENDECTOMY      COLONOSCOPY      CYSTOSCOPY      CYSTOSCOPY W/ LASER LITHOTRIPSY      OTHER SURGICAL HISTORY      Interstim 01/29/2011    TRANSURETHRAL RESECTION OF PROSTATE          Functional History  Prior Level of Function: No deficits  Communication : No communication impairment  Employment Status: Retired  Teacher, 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 Pt was seen by PT post-operatively.        Subjective  Subjective Information: Pt reports legs feel heavy.         Activity Restrictions   Activity Restrictions: None          Objective  Circumferential measurements     Circumferential Measurements:  Date 04/28/20 04/28/20     Area Measured L leg R leg     40 39 40     30 42.5 44.5     20 34.5 34     10 26.5 27     6  ankle 28.5  30     12  26.5 26.5     19 MTP 26 25.5                                                                                                  OT Lymphedema     Row Name 04/28/20 1300       Hand Dominance    Hand Dominance  Right    Row Name 04/28/20 1300       General ADL/Self-Care Assessment - Lymphedema    Assistance Level  Independent in all ADLs    Functional Mobility  Ambulates independently    Support System  Occasional support    Row Name 04/28/20 1300       Areas Affected    Right Lower Extremity  Ankle creases;Malleoli;Calf    Left Lower Extremity  Ankle creases;Malleoli;Calf    Row Name 04/28/20 1300       Skin Condition    Skin Condition  Color;Texture;Turgor    Skin Color  Mild redness    Skin Texture  Dry;Shiny    Skin Turgor  Firm    Other Skin Condition Findings  1+pitting edema in B legs    Lymphedema Stage (Fldi Scale)  1-Mild (spontaneously reversible)            Assessment: Pt presents w/ mild swelling in B feet, ankles, and lower legs:1+ pitting edema. Pt reports pain since adolesence with sx of "heaviness and tightness" in B legs. Pt states he has exercises that he was given in PT but needs to get back to doing them.  Pt ordered compression stockings and is awaiting arrival. Presentation of legs do not warrant compression bandaging at this time. Recommend pt engage in consistent performance of  "muscle pumping" exercises, including diaphragmatic breathing. Recommend pt begin wearing compression stockings daily, remove at night. Pt expressed reluctance to wearing garments. Inst pt to give it a try for least one week, as typically the use of compression garments can be beneficial in addressing sx of pain, heaviness and tightness. Discussed option for use of compression pump, however pt did not express interest. Pt fit w/ temporary compression using size F Tubigrip stockinettes on B lower legs. Inst pt to wear daily and remove at night, until garments received. Pt appears to understand all that was  discussed today. All questions answered. No further OT intervention indicated @ this time.    Plan  The plan of care was developed in conjunction with the patients goals. It was reviewed with the patient, including a review of the physical findings, proposed treatment, frequency and duration of treatment sessions, precautions, limitations and expected outcomes. The patient acknowledged understanding of all of the above and agreed to the treatment plan as stated.    Patient Goals :Patient Stated Goal: Figure out why legs are swollen.           Outpatient Treatment Plan  Amount of Treatment: 1 time per day  Frequency of Treatment: One time only, further treatment not indicated           Patient/Family Education  Learner(s): Patient  Patient/Family Training in Appropriate Therapeutic Interventions: Completed this visit  Education Topic(s): Exercise Program;Lymphedema Management          Treatment Today     OT Charging [place] Name 04/28/20 1300          Type of Eval    Low Complexity [id number])  Completed     Row Name 04/28/20 1300          Therapeutic Procedures    Manual Therapy ([id number])  Girth measurements/fit with compression garment: Tubigrip stockinettes size F;Patient education on lymphatic system and possible dx of exclusion being Primary Lymphedema, given hx of sx since adolescent years. [name] (-) for both DVT and VI. Pt educated on importance of exercise and consistent compression.         Total TIMED Treatment (min)  15     Therapeutic Exercise ([id number])  Range of motion exercises;"Muscle Pumping" Home Exercise Program;Home Exercise Program (HEP) demonstration and performance     Location  Lower extremity; Diaphragmatic breathing          Total TIMED Treatment (min)  15             Timed Treatment      Treatment Start Time: 1000  Total TIMED Treatment (min): 30  Total Treatment Time (min): 45

## 2020-05-05 ENCOUNTER — Encounter (INDEPENDENT_AMBULATORY_CARE_PROVIDER_SITE_OTHER): Payer: Self-pay | Admitting: Orthopaedic Surgery of the Spine

## 2020-05-05 DIAGNOSIS — Z981 Arthrodesis status: Secondary | ICD-10-CM

## 2020-05-06 ENCOUNTER — Encounter: Payer: Self-pay | Admitting: Family Medicine

## 2020-05-06 ENCOUNTER — Inpatient Hospital Stay (INDEPENDENT_AMBULATORY_CARE_PROVIDER_SITE_OTHER): Admit: 2020-05-06 | Discharge: 2020-05-06 | Disposition: A | Discharge status: Home Health | Payer: Medicare Other

## 2020-05-06 ENCOUNTER — Ambulatory Visit (INDEPENDENT_AMBULATORY_CARE_PROVIDER_SITE_OTHER): Payer: Medicare Other | Admitting: Orthopaedic Surgery of the Spine

## 2020-05-06 DIAGNOSIS — G8929 Other chronic pain: Secondary | ICD-10-CM

## 2020-05-06 DIAGNOSIS — M546 Pain in thoracic spine: Secondary | ICD-10-CM

## 2020-05-06 DIAGNOSIS — G6289 Other specified polyneuropathies: Secondary | ICD-10-CM

## 2020-05-06 DIAGNOSIS — Z981 Arthrodesis status: Secondary | ICD-10-CM

## 2020-05-06 DIAGNOSIS — G4700 Insomnia, unspecified: Secondary | ICD-10-CM

## 2020-05-06 DIAGNOSIS — M4310 Spondylolisthesis, site unspecified: Secondary | ICD-10-CM

## 2020-05-06 DIAGNOSIS — M5416 Radiculopathy, lumbar region: Secondary | ICD-10-CM

## 2020-05-06 DIAGNOSIS — M4316 Spondylolisthesis, lumbar region: Secondary | ICD-10-CM

## 2020-05-06 MED ORDER — ZOLPIDEM TARTRATE 5 MG OR TABS
5.0000 mg | ORAL_TABLET | Freq: Every evening | ORAL | 0 refills | Status: DC | PRN
Start: 2020-05-06 — End: 2020-07-10

## 2020-05-06 MED ORDER — LORAZEPAM 1 MG OR TABS
1.0000 mg | ORAL_TABLET | Freq: Once | ORAL | 0 refills | Status: AC
Start: 2020-05-06 — End: 2020-05-06

## 2020-05-06 MED ORDER — PREGABALIN 75 MG OR CAPS
75.0000 mg | ORAL_CAPSULE | Freq: Two times a day (BID) | ORAL | 0 refills | Status: DC
Start: 2020-05-06 — End: 2020-06-10

## 2020-05-06 NOTE — Progress Notes (Signed)
[facility] Evaluation  05/06/2020    Patient ID:  Name: Pradeep Keirsey  MRN: [id number]  DOB: Sep 15, 1951    Reason for Visit: Follow-up.    Procedure(s):  DOS: 09/06/2019 - XLIF L3 thru L5; Laminectomy an PSIF L3 thru L5    HPI:   Rajdeep Abbs is a 69 year old male who presents to clinic for follow-up evaluation 8 months from the above procedure.  He continues to have low back pain and left buttocks pain.  He states the left lower extremity symptoms he was having prior to surgery have improved.  Reports a longstanding history of bilateral lower extremities feeling heavy "like concrete".  He also reports bilateral calf burning pain that has been present for years.  He was evaluated by Dr. lane from vascular surgery who ordered a bilateral lower extremity ultrasound and referred him to lymphedema clinic where they recommended compression stockings.  He also underwent Left sacroiliac steroid injection on 4/6 and Left L5-S1 transforminal epidural steroid injection on 4/21 both with Dr. Bridgett Larsson.  Neither injection provided any relief.    Past medical history, past surgical history, social history, family history, allergies, and medications are unchanged from prior visit.    Physical Examination:   There were no vitals taken for this visit.    General / Psychiatric: healthy, no distress, cooperative, AOx3, normal affect, appropriate  Cardio: RRR per peripheral pulses  Pulmonary: breathing quietly without use of accessory muscles  Back: Posterior and lateral incision well healed without surrounding erythema or active drainage    Bilateral Lower Extremity   Motor:       Right           Left        Iliopsoas (L2/3)                     5/5           5/5         Quadriceps (L4)                    5/5           5/5        Tibialis Anterior (L4/5)           5/5           5/5        EHL (L5)                               5/5           5/5        GSC (S1)                              5/5           5/5   Sensation:         SILT in L2-S1 distributions.   Vascular:        Toes WWP.    Imaging:   Radiographs of the lumbar were obtained and reviewed personally in clinic which demonstrate intact hardware.  There has been development of L5-S1 spondylolisthesis since prior to surgery.    Assessment & Plan:  69 year old male presenting for follow-up evaluation approximately 8 months from L3 through L5 XLIF/PSIF who presents with continued low back and left buttocks  pain.  Radiographs demonstrate intact hardware and maintained alignment through the surgical area.  However there has been development of L5-S1 spondylolisthesis.  Given this change and continued low back and buttock symptoms, we will proceed with an MRI the lumbar spine.  He is also having thoracic back symptoms therefore we will obtain an MRI of the thoracic spine.  Pending these results plan will be for stage I L5-S1 ALIF and Stage 2 revision posterior fusion.    - MRI lumbar and thoracic spine  - Ativan 1 mg prior to MRI  - Lyrica 75 mg BID #60 tablets  - Ambien 5 mg PRN nightly #30 tablets    Plan for   Stage 1: ALIF L5-S1  Stage 2: revision posterior fusion with extension to S1, possible pelvis     FOLLOWUP: Return to clinic after MRI for imaging completed a and vascular surgery evaluation for anterior approach to the spine. The patient is encouraged to call us with any questions or problems in the interim. Our contact numbers were given to the patient.    Orthopaedic Spine Surgery Service - Consent Documentation  Discussed recommended operative procedure with patient in detail. Indications and procedural description provided in depth. All questions answered. Risks of the surgery were discussed which included but were not limited to - blood loss, superficial or deep infection, damage to nerve roots or the spinal cord resulting in anaesthesia, paralysis, or loss of bowel or bladder function, dural tear requiring repair and immobilization, incomplete resolution of  symptoms, need for future procedures, chronic pain, cement extravasation, iatrogenic fracture, retrograde ejaculation, and risks associated with anaesthesia including deep venous thrombosis, pulmonary embolism, heart attack, stroke, or possibly death. Patient verbalized an understanding of these risks and signed consent can be found in the chart and EMR.    Ane Payment, MD  Orthopedic Spine Fellow

## 2020-05-07 ENCOUNTER — Ambulatory Visit (HOSPITAL_BASED_OUTPATIENT_CLINIC_OR_DEPARTMENT_OTHER): Payer: Medicare Other | Admitting: Rehabilitative and Restorative Service Providers"

## 2020-05-15 ENCOUNTER — Ambulatory Visit (HOSPITAL_BASED_OUTPATIENT_CLINIC_OR_DEPARTMENT_OTHER): Payer: Medicare Other | Admitting: Rehabilitative and Restorative Service Providers"

## 2020-05-20 ENCOUNTER — Other Ambulatory Visit: Payer: Self-pay

## 2020-05-20 NOTE — Progress Notes (Signed)
Attending Note:    Subjective:  I reviewed the history.  Patient interviewed and examined.  History of present illness (HPI):  Low Back Pain     Review of Systems (ROS): As per the fellow's note.  Past Medical, Family, Social History:  As per the fellow's  note.    Objective:   I have examined the patient and I concur with the fellow's exam.  Assessment and plan reviewed with the fellow. I agree with the fellow's plan as documented.  See the fellow's note for further details.

## 2020-05-21 ENCOUNTER — Ambulatory Visit (HOSPITAL_BASED_OUTPATIENT_CLINIC_OR_DEPARTMENT_OTHER): Payer: Medicare Other | Admitting: Rehabilitative and Restorative Service Providers"

## 2020-05-22 ENCOUNTER — Inpatient Hospital Stay (INDEPENDENT_AMBULATORY_CARE_PROVIDER_SITE_OTHER)
Admit: 2020-05-22 | Discharge: 2020-05-22 | Disposition: A | Discharge status: Home Health | Payer: Medicare Other | Attending: Orthopaedic Surgery of the Spine | Admitting: Orthopaedic Surgery of the Spine

## 2020-05-22 DIAGNOSIS — G8929 Other chronic pain: Secondary | ICD-10-CM

## 2020-05-22 DIAGNOSIS — M4310 Spondylolisthesis, site unspecified: Secondary | ICD-10-CM

## 2020-05-22 DIAGNOSIS — M5416 Radiculopathy, lumbar region: Secondary | ICD-10-CM

## 2020-05-27 ENCOUNTER — Ambulatory Visit (HOSPITAL_BASED_OUTPATIENT_CLINIC_OR_DEPARTMENT_OTHER): Payer: Medicare Other | Admitting: Rehabilitative and Restorative Service Providers"

## 2020-06-01 ENCOUNTER — Encounter (INDEPENDENT_AMBULATORY_CARE_PROVIDER_SITE_OTHER): Payer: Self-pay | Admitting: Orthopaedic Surgery of the Spine

## 2020-06-02 NOTE — Telephone Encounter (Signed)
From: Stephen [name]  To: [name], MD  Sent: 8/41/6606 3:38 PM PDT  Subject: 2-Procedural Question    saw you for a visit May 19 had a MRI JUNE 4 just wondering about any plans for my medical treatment surgery or? [id number] Thank You

## 2020-06-03 ENCOUNTER — Encounter (HOSPITAL_BASED_OUTPATIENT_CLINIC_OR_DEPARTMENT_OTHER): Payer: Self-pay | Admitting: Orthopaedic Surgery of the Spine

## 2020-06-03 ENCOUNTER — Telehealth (HOSPITAL_BASED_OUTPATIENT_CLINIC_OR_DEPARTMENT_OTHER): Payer: Self-pay | Admitting: Orthopaedic Surgery of the Spine

## 2020-06-03 NOTE — Telephone Encounter (Signed)
Good afternoon Stephen [name],    I have scheduled your surgery with Dr. Lelon Huh on 05/31/23. I have send you the surgery information letter via Mychart please review and contact me with any questions at [phone] and ask to be transferred to [facility].    I have tried calling on your cell phone number but have not been able to contact you as voicemail is full. Please call me to confirm surgery.    Thank you  Jeanett Schlein

## 2020-06-03 NOTE — Telephone Encounter (Signed)
Returned patient call. Unable to leave message voicemail full.

## 2020-06-04 ENCOUNTER — Encounter: Payer: Self-pay | Admitting: Hospital

## 2020-06-08 ENCOUNTER — Encounter (HOSPITAL_COMMUNITY): Payer: Self-pay | Admitting: Orthopaedic Surgery of the Spine

## 2020-06-09 NOTE — Telephone Encounter (Signed)
From: Lovena Le  To: [name], MD  Sent: 03/02/1760 4:40 PM PDT  Subject: 20-Other    This is Stephen [name] YW#[id number] please prescribe something for this back pain it is constant and affecting many areas I am awaiting refills on Lyrica and Ambien my surgery is not until late August thank you

## 2020-06-09 NOTE — Telephone Encounter (Signed)
Please schdule a follow up for this patient.

## 2020-06-10 ENCOUNTER — Other Ambulatory Visit (HOSPITAL_BASED_OUTPATIENT_CLINIC_OR_DEPARTMENT_OTHER): Payer: Self-pay | Admitting: Orthopaedic Surgery of the Spine

## 2020-06-10 DIAGNOSIS — G4700 Insomnia, unspecified: Secondary | ICD-10-CM

## 2020-06-10 DIAGNOSIS — G6289 Other specified polyneuropathies: Secondary | ICD-10-CM

## 2020-06-10 NOTE — Telephone Encounter (Signed)
DOS: 08/18/20 Lumbar fusion    Refill request sent to provider.

## 2020-06-10 NOTE — Telephone Encounter (Signed)
Prescription Refill Request -     Please update preferred Pharmacy in patient Registration prior to sending a message, if the pharmacy is not on file. This step is crucial, patients may want a prescription filled at a different pharmacy each time and we need to assure that we have that information in our system.    Done.       Preferred Pharmacy for e-prescription [employer] insurance will not be approved at [facility]. [facility] and [facility] will approve):  [id number] - [place], [place]      Preferred clinic pick-up location or desires medication to be sent electronically to pharmacy if possible?  E-Prescribe      What order is the Preferred Pharmacy for this prescription in registration (1,2,3) ?  1      Medication:       pregabalin (LYRICA) 75 MG capsule (Order# [id number])  Date and Time: 05/06/2020 11:41 AM Department: [facility] Ordering/Authorizing: Zlomislic, Vinko, MD     zolpidem (AMBIEN) 5 MG tablet (Order# [id number])  Date and Time: 05/06/2020 11:39 AM Department: [facility] Ordering/Authorizing: Zlomislic, Vinko, MD         *Patient would also like to have a prescription for some type of pain       mediation.  He is currently not taking any.*    Date of last refill:   05/06/20    How many Pills/Tablets does the patient have left?  0    Name of original prescribing physician:  Dr. Lelon Huh     DOS:  40/09/2724    LOV notes and plan:  05/06/20  Assessment & Plan:  69 year old male presenting for follow-up evaluation approximately 8 months from L3 through L5 XLIF/PSIF who presents with continued low back and left buttocks pain.  Radiographs demonstrate intact hardware and maintained alignment through the surgical area.  However there has been development of L5-S1 spondylolisthesis.  Given this change and continued low back and buttock symptoms, we will proceed with an MRI the lumbar spine.  He is also having thoracic back  symptoms therefore we will obtain an MRI of the thoracic spine.  Pending these results plan will be for stage I L5-S1 ALIF and Stage 2 revision posterior fusion.    - MRI lumbar and thoracic spine  - Ativan 1 mg prior to MRI  - Lyrica 75 mg BID #60 tablets  - Ambien 5 mg PRN nightly #30 tablets    Plan for   Stage 1: ALIF L5-S1  Stage 2: revision posterior fusion with extension to S1, possible pelvis    FOLLOWUP: Return to clinic after MRI for imaging completed a and vascular surgery evaluation for anterior approach to the spine. The patient is encouraged to call us with any questions or problems in the interim. Our contact numbers were given to the patient.    Orthopaedic Spine Surgery Service - Consent Documentation  Discussed recommended operative procedure with patient in detail. Indications and procedural description provided in depth. All questions answered. Risks of the surgery were discussed which included but were not limited to - blood loss, superficial or deep infection, damage to nerve roots or the spinal cord resulting in anaesthesia, paralysis, or loss of bowel or bladder function, dural tear requiring repair and immobilization, incomplete resolution of symptoms, need for future procedures, chronic pain, cement extravasation, iatrogenic fracture, retrograde ejaculation, and risks associated with anaesthesia including deep venous thrombosis, pulmonary embolism,  heart attack, stroke, or possibly death. Patient verbalized an understanding of these risks and signed consent can be found in the chart and EMR.    Ane Payment, MD  Orthopedic Spine Fellow    Best callback phone number and best time:  Phone# [phone]        Department Policy: We are only able to prescribe medications for patients that are current established patients (seen within 6 months, unless their notes state return in a year).       Please allow up to up to 72 hours for our office to process your medications request    Please  verify any new medication allergies with patient, Please review chart to see notes from last refill request if patient has obtained previous refills of same medication.

## 2020-06-11 MED ORDER — PREGABALIN 75 MG OR CAPS
75.0000 mg | ORAL_CAPSULE | Freq: Two times a day (BID) | ORAL | 0 refills | Status: DC
Start: 2020-06-11 — End: 2020-07-10

## 2020-06-11 NOTE — Telephone Encounter (Signed)
Refilled lyrica. Pt informed to go to PCP for Ambien refill. Left VM.

## 2020-06-11 NOTE — Telephone Encounter (Signed)
Refilled Lyrica, but Stephen [name] would have to be from PCP

## 2020-06-16 ENCOUNTER — Encounter (HOSPITAL_COMMUNITY): Payer: Self-pay | Admitting: Orthopaedic Surgery of the Spine

## 2020-06-17 NOTE — Telephone Encounter (Signed)
From: Lovena Le  To: [name], MD  Sent: 0/37/9444 3:52 PM PDT  Subject: 2-Procedural Question    QF#[id number] am I going to get an information needed packet to take to my GP that is needed for pre-op? I have an appointment on July9 and [phone]

## 2020-06-18 NOTE — Telephone Encounter (Signed)
Returned patient call.. Informed him medical clearance letter was faxed to his PCP Dr. Areta Haber. Please call with any questions at [phone].

## 2020-07-06 ENCOUNTER — Encounter (HOSPITAL_COMMUNITY): Payer: Self-pay | Admitting: Orthopaedic Surgery of the Spine

## 2020-07-07 NOTE — Telephone Encounter (Signed)
From: Lovena Le  To: [name], MD  Sent: 9/43/2761 4:46 PM PDT  Subject: 20-Other    Would you order some pain medicine for my back it's radiating all over I know my surgery is late August it would help me my GP does not want to order ambien I do not sleep as this discomfort is more in my lumbar and my legs at rest time thanks

## 2020-07-09 ENCOUNTER — Encounter (HOSPITAL_BASED_OUTPATIENT_CLINIC_OR_DEPARTMENT_OTHER): Payer: Self-pay | Admitting: Nurse Practitioner

## 2020-07-09 DIAGNOSIS — G6289 Other specified polyneuropathies: Secondary | ICD-10-CM

## 2020-07-09 DIAGNOSIS — Z981 Arthrodesis status: Secondary | ICD-10-CM

## 2020-07-09 DIAGNOSIS — G4700 Insomnia, unspecified: Secondary | ICD-10-CM

## 2020-07-10 MED ORDER — ZOLPIDEM TARTRATE 5 MG OR TABS
5.0000 mg | ORAL_TABLET | Freq: Every evening | ORAL | 0 refills | Status: DC | PRN
Start: 2020-07-10 — End: 2020-08-18

## 2020-07-10 MED ORDER — ACETAMINOPHEN 325 MG PO TABS
650.0000 mg | ORAL_TABLET | Freq: Four times a day (QID) | ORAL | 0 refills | Status: DC | PRN
Start: 2020-07-10 — End: 2020-08-22

## 2020-07-10 MED ORDER — PREGABALIN 75 MG OR CAPS
75.0000 mg | ORAL_CAPSULE | Freq: Two times a day (BID) | ORAL | 0 refills | Status: DC
Start: 2020-07-10 — End: 2020-08-18

## 2020-07-10 NOTE — Telephone Encounter (Signed)
Noted  Will route to provider to advise further for rx as per LOV   Lyrica 75 mg BID #60 tablets  - Ambien 5 mg PRN nightly #30 tablets  Prev encounter notes PCP to fill Lorrin Mais however PCP is declining

## 2020-07-10 NOTE — Telephone Encounter (Signed)
From: Lovena Le  To: Sheria Lang, NP  Sent: 07/09/2020 12:03 PM PDT  Subject: 20-Other    Asking for some pain medication for my whole back and spine area more progressive now whole back area sitting walking very uncomfortable not sleeping well at all my GP does not want to order Ambien as Dr.V did on my last visit any suggestions would help my surgery is August end Thanks did leave a message for Dr.V office a few days ago I know they are busy

## 2020-07-10 NOTE — Telephone Encounter (Signed)
Covering for [place], NP.  Pt with upcoming spine surgery.  Requested refills sent.

## 2020-07-18 ENCOUNTER — Encounter: Payer: Self-pay | Admitting: Family Medicine

## 2020-07-18 ENCOUNTER — Encounter: Payer: Self-pay | Admitting: Family

## 2020-07-18 DIAGNOSIS — R748 Abnormal levels of other serum enzymes: Secondary | ICD-10-CM

## 2020-07-20 ENCOUNTER — Other Ambulatory Visit: Payer: Self-pay

## 2020-07-20 MED ORDER — ATORVASTATIN CALCIUM 80 MG PO TABS: 40.0000 mg | ORAL_TABLET | Freq: Every day | ORAL | 0 refills | Status: DC

## 2020-07-29 ENCOUNTER — Encounter (HOSPITAL_BASED_OUTPATIENT_CLINIC_OR_DEPARTMENT_OTHER): Payer: Self-pay | Admitting: Physician Assistant

## 2020-07-29 ENCOUNTER — Ambulatory Visit: Payer: Medicare Other | Admitting: Physician Assistant

## 2020-07-29 DIAGNOSIS — Z981 Arthrodesis status: Secondary | ICD-10-CM

## 2020-07-29 DIAGNOSIS — Z01818 Encounter for other preprocedural examination: Secondary | ICD-10-CM

## 2020-07-29 DIAGNOSIS — M4310 Spondylolisthesis, site unspecified: Secondary | ICD-10-CM

## 2020-07-29 DIAGNOSIS — M5416 Radiculopathy, lumbar region: Secondary | ICD-10-CM

## 2020-07-29 MED ORDER — MUPIROCIN 2 % EX OINT
1.0000 | TOPICAL_OINTMENT | Freq: Two times a day (BID) | CUTANEOUS | 0 refills | Status: DC
Start: 2020-08-13 — End: 2020-08-22

## 2020-07-29 MED ORDER — CHLORHEXIDINE GLUCONATE 4 % EX LIQD (CUSTOM)
CUTANEOUS | 0 refills | Status: DC
Start: 2020-07-29 — End: 2020-08-22

## 2020-07-29 NOTE — H&P (Cosign Needed)
---------------------(data below generated by Caprice Renshaw, PA)--------------------     Patient Verification & Telemedicine Consent & Financial Waiver:    1.   Identity: I have verified this patient's identity to be accurate.  2.   Consent: I verify consent has been secured in one of the following methods: (a) obtained written/ online attestation consent (via MyChartVideoVisit pathway), (b) the spoke-side provider has obtained verbal or written consent from patient/surrogate (if this is a "provider to provider" evaluation), or (c) in all other cases, I have personally obtained verbal consent from the patient/ surrogate (noting all elements below) to perform this voluntary telemedicine evaluation (including obtaining history, performing examination and reviewing data provided by the patient).   The patient/ surrogate has the right to refuse this evaluation.  I have explained risks (including potential loss of confidentiality), benefits, alternatives, and the potential need for subsequent face to face care. Patient/ surrogate understands that there is a risk of medical inaccuracies given that our recommendations will be made based on reported data (and we must therefore assume this information is accurate).  Knowing that there is a risk that this information is not reported accurately, and that the telemedicine video, audio, or data feed may be incomplete, the patient agrees to proceed with evaluation and holds [name] harmless knowing these risks.  3.   Healthcare Team: The patient/ surrogate has been notified that other healthcare professionals (including students, residents and Metallurgist) may be involved in this audio-video evaluation.   All laws concerning confidentiality and patient access to medical records and copies of medical records apply to telemedicine.  4.   Privacy: If this is a Radiographer, therapeutic Visit, the patient/ surrogate has received the [place] Notice of Privacy Practices via E-Checkin process.   For all other video visit techniques, I have verbally provided the patient/ surrogate with the [place] in [place] ([email or web address]) or Spanish ([email or web address]://[email or web address]://[email or web address]).  The patient/ surrogate acknowledges both being provided the NPP link, and has been offered to have the NPP mailed to the patient/ surrogate by [name] mail.  The patient/ surrogate has voiced understanding an acknowledgement of receipt of this NPP web address.  If the patient/surrogate has elected to receive the NPP via [name] mail, I verify that the NPP will be sent promptly to the patient/surrogate via [name] mail.  5.   Capacity: I have reviewed this above verification and consent paragraph with the patient/ surrogate and the patient is capacitated or has a surrogate. If the patient is not capacitated to understand the above, and no surrogate is available, since this is not an emergency evaluation, the visit will be rescheduled until such time that the patient can consent, or the surrogate is available to consent. If this is an emergency evaluation and the patient is not capacitated to understand the above, and no surrogate is available, I am proceeding with this evaluation as this is felt to be an emergency setting and no appropriate specialist is available at the bedside to perform these evaluations.  6.   Financial Waiver: If this is a Radiographer, therapeutic Visit, the patient has been made aware of the financial waiver via E-Checkin process.  For all other video visit techniques, an E-Checkin process is not performed.  As such, I have personally verbally informed the patient/ surrogate that this evaluation will be a billable encounter similar to an in-person clinic visit, and the patient/ surrogate has agreed to pay the fee for services rendered.  If we are billing insurance for the  patient's telehealth visit, his out-of-pocket cost will be determined based on his plan and will be billed to him.  The  patient/ surrogate has also been informed that if the patient does not have insurance or does not wish to use insurance, [place] [employer] price for a primary care telehealth visit is $59.00 and specialist telehealth visit is $88.00.  I have further informed the patient/ surrogate that in the event the patient has additional services provided in conjunction with the specialty visit (Ex. Psychotherapy services), those services will be billed at the current rate less a 45% discount.  7.   Intra-State Location: The patient/ surrogate attests to understanding that if the patient accesses these services from a location outside of [place], that the patient does so at the patient's own risk and initiative and that the patient is ultimately responsible for compliance with any laws or regulations associated with the patient's use.  8.   Specific Use:The patient/ surrogate understands that [place] makes no representation that materials or servicesdelivered via telecommunication services, or listed on telemedicine websites, are appropriate or available for use in any other location.           Demographics:  Medical Record #: [id number]  Date: July 29, 2020  Patient Name: Stephen [name]  DOB: [birth year]  Age: 69 year old  Sex: male  Location: Home address on file     Evaluator(s):  Percival Glasheen was evaluated by me today.    Clinic Location: [facility]  [address]  [place] [address]     PREOPERATIVE HISTORY & PHYSICAL EXAM      Referring Physician Self, Referred  Primary Care Physician [name]    History of Illness:  This is a 69 year old male here for preoperative exam prior to Stage 1: ALIF L5-S1 ; Stage 2 revision laminectomy and PSIF L3 thru S1 by Dr Zlomislic on 01/06/13.       Allergies   Allergen Reactions    Amoxicillin Rash    Sulfa Drugs Unspecified       Patient Active Problem List   Diagnosis    Post-traumatic stricture of anterior urethra     Bladder neck contracture    Lumbar radiculopathy    Cervical radiculopathy    Incomplete bladder emptying    Lumbosacral spondylosis without myelopathy    Chronic SI joint pain    Degenerative spondylolisthesis    Lumbar stenosis with neurogenic claudication    Pre-procedure lab exam    Cervical spondylosis without myelopathy    Cervical spondylosis    S/P lumbar fusion    Chronic left SI joint pain    Chronic midline thoracic back pain       Significant PMH/PSH:  Past Medical History:   Diagnosis Date    Chronic suprapubic catheter (CMS-HCC)     Congenital hydronephrosis     Gout     Headache     Hematuria     Kidney disease     Kidney stones     Major depressive disorder, single episode     Polyarthropathy or polyarthritis of multiple sites     Retinal detachment     Urethral stricture      Past Surgical History:   Procedure Laterality Date    CT INSERTION OF SUPRAPUBIC CATH  09/25/2015    NEPHRECTOMY Right 1995    APPENDECTOMY      COLONOSCOPY  CYSTOSCOPY      CYSTOSCOPY W/ LASER LITHOTRIPSY      OTHER SURGICAL HISTORY      Interstim 01/29/2011    TRANSURETHRAL RESECTION OF PROSTATE       Social History     Socioeconomic History    Marital status: Single     Spouse name: Not on file    Number of children: Not on file    Years of education: Not on file    Highest education level: Not on file   Occupational History    Not on file   Tobacco Use    Smoking status: Never Smoker    Smokeless tobacco: Never Used   Substance and Sexual Activity    Alcohol use: Not Currently    Drug use: Not Currently    Sexual activity: Never   Other Topics Concern    Not on file   Social History Narrative    Not on file     Social Determinants of Health     Financial Resource Strain:     Difficulty of Paying Living Expenses:    Food Insecurity:     Worried About Charity fundraiser in the Last Year:     Arboriculturist in the Last Year:    Transportation Needs:     Lexicographer (Medical):     Lack of Transportation (Non-Medical):    Physical Activity:     Days of Exercise per Week:     Minutes of Exercise per Session:    Stress:     Feeling of Stress :    Social Connections:     Frequency of Communication with Friends and Family:     Frequency of Social Gatherings with Friends and Family:     Attends Religious Services:     Active Member of Clubs or Organizations:     Attends Music therapist:     Marital Status:    Intimate Partner Violence:     Fear of Current or Ex-Partner:     Emotionally Abused:     Physically Abused:     Sexually Abused:        Medications  Current Outpatient Medications   Medication Sig Dispense Refill    acetaminophen (TYLENOL) 325 MG tablet Take 2 tablets (650 mg) by mouth every 6 hours as needed for Mild Pain (Pain Score 1-3). 90 tablet 0    ALAWAY 0.025 % ophthalmic solution INSTILL 1 DROP INTO BOTH EYES TWICE A DAY AS DIRECTED      allopurinol (ZYLOPRIM) 300 MG tablet Take 300 mg by mouth daily.      amLODIPINE (NORVASC) 5 MG tablet Take 10 mg by mouth daily.      Cetirizine HCl (ZYRTEC PO) 10 mg.       chlorhexidine (HIBICLENS) 4 % liquid Take two showers the day before surgery (morning and evening) and one the morning of.  Rinse thoroughly. 240 mL 0    ciclopirox (LOPROX) 0.77 % GEL Apply topically 2 times daily.      [START ON 08/13/2020] mupirocin (BACTROBAN) 2 % ointment Apply 1 Application topically 2 times daily. Use a small amount as directed 1 each 0    omeprazole (PRILOSEC) 20 MG capsule Take 20 mg by mouth daily.      Polyethyl Glycol-Propyl Glycol (SYSTANE ULTRA OP)       pregabalin (LYRICA) 75 MG capsule Take 1 capsule (75 mg) by mouth 2 times daily. 60 capsule  0    triamcinolone (KENALOG) 0.1 % cream Apply 1 Application topically 2 times daily. Apply a thin layer as directed      VITAMIN D PO Take 500 mg by mouth daily.       zolpidem (AMBIEN) 5 MG tablet Take 1 tablet (5 mg) by mouth nightly  as needed for Insomnia. 30 tablet 0     No current facility-administered medications for this visit.       Patient is not currentlyon any anticoagulation medications    Review of Systems:  General:  Negative for fevers, chills or night sweats.  Skin: Negative for rashes, sores or infection.  Eyes: Negative for visual changes, diplopia or blurry vision.  Ears/Nose/Throat/Mouth: Negative for dental infection or problems.  Negative for sore throat or congestion.   Respiratory: Negative for cough, sputum production, wheezing, sleep apnea.  Cardiovascular: Negative for chest pain, palpitations, syncope, orthopnea or pnd.  Gastrointestinal: Negative for nausea, vomiting, diarrhea, melena or hematochezia.    Genitourinary: Negative for dysuria, frequency, hesitancy or nocturia.  Musculoskeletal: See HPI  Neurologic: Negative for history of seizures.  Psychiatric: negative  Hematologic/Lymphatic/Immunologic: Negative for anemia or bleeding problems.  Negative for DVT or PE history.  Endocrine: negative    Physical Exam:     Vitals: There were no vitals taken for this visit.    PHYSICAL EXAM:   VITALS: There were no vitals taken for this visit., There is no height or weight on file to calculate BMI.   GENERAL: A+Ox3. INAD. Well appearing and well groomed.   MENTAL STATUS: Pleasant and cooperative. Alert and oriented x3 with normal mood and affect.   Vascular: Extremities appear grossly well perfused   Skin: No rashes/wounds/lesions/ulcers. Skin appears dry.   Respiratory: No respiratory distress   Cardiac: Equal chest expansion with respiration.  No cyanosis.       Labs/Tests/Imaging     Per anesthesia      Diagnosis  Encounter Diagnoses   Name Primary?    Degenerative spondylolisthesis Yes    Lumbar radiculopathy     S/P lumbar fusion     Pre-op exam        Assessment  Eidan Muellner is a 69 year old male with history as stated above presenting for preoperative evaluation prior to Stage 1: ALIF L5-S1 ; Stage 2  revision laminectomy and PSIF L3 thru S1 by Dr Zlomislic on 5/70/17.        PLAN   Anesthesia pre-op:  08/03/20   Pt has covid testing scheduled.  08/15/20   Vascular clearance for ALIF not completed.  Pt advised to make appt with vascular team.   PCP clearance completed per pt on 7/14.  Will request records.   Final clearance is made by anesthesia team on day of surgery   Per ortho department protocol, bactroban nasal prescribed twice daily for 5 days prior to surgery as MRSA prophylaxis   CHG given   Pt advised:   Nothing to eat or drink after midnight the night prior to surgery.   Take their medications as prescribed unless otherwise directed by anesthesia team   Hold any aspirin (unless advised otherwise by anesthesia), ibuprofen, aleve, naprosyn, celebrex, and other nonsteroidal anti-inflammatory drugs, fish oil, vitamins and supplements for 7 days prior to surgery.  If no liver disease, it is OK to take acetaminophen, (Tylenol) as needed.    Caprice Renshaw, PA-C  Orthopaedic Surgery Physician Assistant  Supervising Physician - R Kenn File, MD, PhD

## 2020-07-29 NOTE — Progress Notes (Deleted)
PREOPERATIVE HISTORY & PHYSICAL EXAM      Referring Physician Self, Referred  Primary Care Physician [name]    History of Illness:  This is a 69 year old male here for preoperative exam prior to Stage 1: ALIF L5-S1 ; Stage 2 revision laminectomy and PSIF L3 thru S1 by Dr Zlomislic on 2/40/97.       Allergies   Allergen Reactions    Amoxicillin Rash    Sulfa Drugs Unspecified       Patient Active Problem List   Diagnosis    Post-traumatic stricture of anterior urethra    Bladder neck contracture    Lumbar radiculopathy    Cervical radiculopathy    Incomplete bladder emptying    Lumbosacral spondylosis without myelopathy    Chronic SI joint pain    Degenerative spondylolisthesis    Lumbar stenosis with neurogenic claudication    Pre-procedure lab exam    Cervical spondylosis without myelopathy    Cervical spondylosis    S/P lumbar fusion    Chronic left SI joint pain    Chronic midline thoracic back pain       Significant PMH/PSH:  Past Medical History:   Diagnosis Date    Chronic suprapubic catheter (CMS-HCC)     Congenital hydronephrosis     Gout     Headache     Hematuria     Kidney disease     Kidney stones     Major depressive disorder, single episode     Polyarthropathy or polyarthritis of multiple sites     Retinal detachment     Urethral stricture      Past Surgical History:   Procedure Laterality Date    CT INSERTION OF SUPRAPUBIC CATH  09/25/2015    NEPHRECTOMY Right 1995    APPENDECTOMY      COLONOSCOPY      CYSTOSCOPY      CYSTOSCOPY W/ LASER LITHOTRIPSY      OTHER SURGICAL HISTORY      Interstim 01/29/2011    TRANSURETHRAL RESECTION OF PROSTATE       Social History     Socioeconomic History    Marital status: Single     Spouse name: Not on file    Number of children: Not on file    Years of education: Not on file    Highest education level: Not on file   Occupational History    Not on file   Tobacco Use    Smoking status: Never Smoker    Smokeless tobacco:  Never Used   Substance and Sexual Activity    Alcohol use: Not Currently    Drug use: Not Currently    Sexual activity: Never   Other Topics Concern    Not on file   Social History Narrative    Not on file     Social Determinants of Health     Financial Resource Strain:     Difficulty of Paying Living Expenses:    Food Insecurity:     Worried About Charity fundraiser in the Last Year:     Arboriculturist in the Last Year:    Transportation Needs:     Lack of Transportation (Medical):     Lack of Transportation (Non-Medical):    Physical Activity:     Days of Exercise per Week:     Minutes of Exercise per Session:    Stress:     Feeling of Stress :  Social Connections:     Frequency of Communication with Friends and Family:     Frequency of Social Gatherings with Friends and Family:     Attends Religious Services:     Active Member of Clubs or Organizations:     Attends Music therapist:     Marital Status:    Intimate Partner Violence:     Fear of Current or Ex-Partner:     Emotionally Abused:     Physically Abused:     Sexually Abused:        Medications  Current Outpatient Medications   Medication Sig Dispense Refill    acetaminophen (TYLENOL) 325 MG tablet Take 2 tablets (650 mg) by mouth every 6 hours as needed for Mild Pain (Pain Score 1-3). 90 tablet 0    ALAWAY 0.025 % ophthalmic solution INSTILL 1 DROP INTO BOTH EYES TWICE A DAY AS DIRECTED      allopurinol (ZYLOPRIM) 300 MG tablet Take 300 mg by mouth daily.      amLODIPINE (NORVASC) 5 MG tablet Take 10 mg by mouth daily.      benazepril-hydrochlorthiazide (LOTENSIN HCT) 20-25 MG tablet Take 1 tablet by mouth daily.      Cetirizine HCl (ZYRTEC PO) 10 mg.       ciclopirox (LOPROX) 0.77 % GEL Apply topically 2 times daily.      omeprazole (PRILOSEC) 20 MG capsule Take 20 mg by mouth daily.      Polyethyl Glycol-Propyl Glycol (SYSTANE ULTRA OP)       pregabalin (LYRICA) 75 MG capsule Take 1 capsule (75 mg) by  mouth 2 times daily. 60 capsule 0    triamcinolone (KENALOG) 0.1 % cream Apply 1 Application topically 2 times daily. Apply a thin layer as directed      VITAMIN D PO Take 500 mg by mouth daily.       zolpidem (AMBIEN) 5 MG tablet Take 1 tablet (5 mg) by mouth nightly as needed for Insomnia. 30 tablet 0     No current facility-administered medications for this visit.       Patient {ON ANTICOG QMGQ:67619}    Review of Systems:  General:  Negative for fevers, chills or night sweats.  Skin: Negative for rashes, sores or infection.  Eyes: Negative for visual changes, diplopia or blurry vision.  Ears/Nose/Throat/Mouth: Negative for dental infection or problems.  Negative for sore throat or congestion.   Respiratory: Negative for cough, sputum production, wheezing, sleep apnea.  Cardiovascular: Negative for chest pain, palpitations, syncope, orthopnea or pnd.  Gastrointestinal: Negative for nausea, vomiting, diarrhea, melena or hematochezia.    Genitourinary: Negative for dysuria, frequency, hesitancy or nocturia.  Musculoskeletal: See HPI  Neurologic: Negative for history of seizures.  Psychiatric: negative  Hematologic/Lymphatic/Immunologic: Negative for anemia or bleeding problems.  Negative for DVT or PE history.  Endocrine: negative    Physical Exam:     Vitals: There were no vitals taken for this visit.    General: {GENERAL APPEARANCE:50}  HEENT:  Pupils equal, not pinpoint.  Cardiac:  {Cardiac Exam:14559}  Pulmonary:{LUNGS BRIEF EXAM:404}  Abdomen: nondistended  Neuro:{neuro:5902::"normal without focal findings","mental status, speech normal, alert and oriented x iii","PERLA","reflexes normal and symmetric"}  Skin: {SKIN EXAM:120131}  Musculoskeletal:  Pt ambulatory? {YES/NO:11202}    Labs/Tests/Imaging     Per anesthesia      Diagnosis  No diagnosis found.    Assessment  Stephen [name] is a 69 year old male with history as stated above presenting for  preoperative evaluation prior to Stage 1: ALIF L5-S1 ;  Stage 2 revision laminectomy and PSIF L3 thru S1 by Dr Zlomislic on 2/35/36.        PLAN   Anesthesia pre-op:  08/03/20   Pt has covid testing scheduled.  08/15/20   Vascular clearance for ALIF ***   PCP clearance ***   Final clearance is made by anesthesia team on day of surgery   Per ortho department protocol, bactroban nasal prescribed twice daily for 5 days prior to surgery as MRSA prophylaxis   CHG given   Pt advised:   Nothing to eat or drink after midnight the night prior to surgery.   Take their medications as prescribed unless otherwise directed by anesthesia team   Hold any aspirin (unless advised otherwise by anesthesia), ibuprofen, aleve, naprosyn, celebrex, and other nonsteroidal anti-inflammatory drugs, fish oil, vitamins and supplements for 7 days prior to surgery.  If no liver disease, it is OK to take acetaminophen, (Tylenol) as needed.    Caprice Renshaw, PA-C  Orthopaedic Surgery Physician Assistant  Supervising Physician - R Kenn File, MD, PhD

## 2020-07-29 NOTE — Patient Instructions (Addendum)
PREOPERATIVE SURGICAL INFORMATION    Your surgery is currently scheduled at [place], [facility], on 08/18/20  With a planned report time of 5:20am (this time may change, the surgery center will call or send mychart message the day before if this time needs to be changed)      [place], [facility], [place], [address] [place], [place] [address]   Please check in at Main Admissions on the 1st floor    QUESTIONS   If you have any questions between now and the day of your surgery, please do not hesitate to call your surgeon    DAY OF SURGERY ARRIVAL TIME:  On the day of your Surgery/Procedure, please arrive at the time provided by the surgery/preop team.  If you have any questions regarding your arrival time, please call:   Preoperative Surgical Admissions at [facility]: [place]:     MEDICATIONS TO STOP 7 DAYS BEFORE SURGERY/PROCEDURE:   PLEASE HOLD ASPIRIN AND ALL NSAIDS (non-steroidal anti-inflammatory drugs) SUCH AS advil, aleve, motrin, ibuprofen, relafen, lodine, feldene, Diclofenac, voltaren, indomethacin, naproxen, celebrex, Mobic.    Please hold vitamins, supplements, herbs & fish oil.   If you do not have liver issues, Tylenol (acetaminophen) is okay    REGULAR PRESCRIPTION MEDICATIONS:   Regular prescription medications should be taken the day of surgery with sips of water.   AFTER YOUR VISIT WITH [name], IF YOU START TAKING A NEW MEDICATION BEFORE SURGERY, PLEASE CALL us TO MAKE SURE IT IS SAFE TO TAKE & WON'T EFFECT YOUR SURGERY.      TO DO LIST:   Bring Cpap supplies with you on day of surgery if you have sleep apnea         EATING/DRINKING   PLEASE DO NOT EAT OR DRINK ANYTHING AFTER MIDNIGHT THE NIGHT BEFORE SURGERY.      Preparing for your Surgery:   Please wear clean loose-fitting clothes and leave valuables at home   Bring a picture ID and your insurance card, and be prepared to pay your deductible  or co-insurance by cash, check, or credit card when you arrive.   If you are going home after your surgery, please make sure to arrange for an adult to drive you home.  If you do not do so, your surgery may be cancelled.      If you are a woman of child bearing age, please note that you may be asked to give a urine sample upon checkin.    On The Day of Your Surgery:    Check in at the location mentioned above   You will meet your anesthesia and surgery teams before surgery.   Once surgery is over, you will wake up in the recovery room where you will be able to see your friends/family.   Once your time in the recovery room is complete, you will either go home or be admitted as planned.   If you go home, someone will need to stay with you for the first 24 hours after surgery.     Additional information about what to expect before & after surgery is available online at:  [email or web address]    Or by searching You-tube for [place] before surgery and [place] after surgery    You medical records are available to you at [email or web address]  Select create account.  ------------------------------------------------------------------     Preparing for your surgery    Shower  with Chlorhexidine (CHG) soap to prevent infection    Instructions:   You should shower with CHG soap a minimum of three times before your surgery, or more often as directed by your surgeon.    Showering several times before surgery blocks germ growth and provides the best protection when used at least 3 times in a row.                      3 =                    +                  +               At least 3 showers       the morning the night the morning of   before surgery before surgery before surgery admission to surgery               Date:_______       Date:_______            Date:_______    How to shower with CHG Soap:  1. Rinse your body with warm water.  2. Wash your hair with regular  shampoo. Rinse your hair with water. If you are having neck surgery, use CHG soap instead of your regular shampoo to wash your hair. Rinse your hair with water.  3. Wet a clean sponge. Turn off the water. Apply CHG liberally.  4. Firmly massage all areas: neck, arms, chest, back, abdomen, hips, groin, genitals (external only) and buttocks. Clean your legs and feet and between your fingers and toes. Pay special attention to the site of your surgery and all surrounding skin. Ask for help to clean your back if you have a spinal surgery.   5. Lather again before rinsing.  6. Turn on the water and rinse CHG off your body.  7. Dry off with a clean towel.  8. Dont apply lotions or powders.   9. Use clean clothes and freshly laundered bed linens.          Repeat steps 1- 9 each time you shower.      Caution: When using CHG soap, avoid contact with eyes, nose, ear canals and mouth.      Important reminders:   Do not use any other soaps or body wash when using CHG. Other soaps can block the CHG benefits.   After showering, do not apply lotion, cream, powder, deodorant, or hair conditioner.   Do not shave or remove body hair. Facial shaving is permitted. If you are having head surgery, ask your doctor whether you can shave.   CHG is safe to use on minor wounds, rashes, burns, and over staples and stiches.   Allergic reactions are rare but may occur. If you have an allergic reaction, stop using CHG and call your doctor if you have a skin irritation.   If you are allergic to CHG, please follow the bathing instructions above using an over-the-counter regular soap instead of CHG.                          ----------------------------------------------------------------  MRSA DECOLONIZATION PROCESS  1. Nasal Ointment twice a day for 5 days leading up to date of surgery  2. Shower or Bathe with chlorhexidine soap (Hibiclens)  3. Wash bed sheets and pajamas at the start  of decolonization process then wash everything,  clothes/sheets/pajamas/pillow cases 1&2 days before.      How to use the nasal ointment (mupirocin 2%)   Your ointment requires a prescription and will come in several small, single-use tubes or in one larger tube.    If you have the small tubes, you should use half of a tube inside each nostril each time you apply the ointment. Throw away the small tube and use a new one next time.    If you have the large tube, you should use a pea-sized amount of ointment inside each nostril each time you apply the ointment. Save the large tube and use it for all your doses.   1. Clean your hands using a sanitizer gel or wash with soap and water for 15 to 20 seconds just before using your ointment.   2. Tilt your head back and use a cotton swab to apply the ointment to the inside of each nostril.   3. Press your nostrils together and massage for about 1 minute.   4. Dont get the ointment near your eyes.    If any of it gets into your eyes, rinse them well with cool water.   5. Apply the nasal ointment twice a day for 5 days unless otherwise directed by your doctor.   6. Clean your hands using a sanitizer gel or wash with soap and water (for 15 to 20 seconds) as soon as you are finished.   Do not use any topical medicines or inside the nose medicines (such as nasal sprays) during the 5 days you are using the ointment.       How to use the soap (4% chlorhexidine) (Hibiclens)   Your soap will come in either a bottle or in packets.   1. If you have packets, use two packets for each application in the shower or bath. If you have the bottle, use about 2 tablespoons of soap for each application in the shower or bath.   2. First, shampoo and rinse your hair with your usual shampoo. This is done first so the Hibiclens soap isnt washed off by your shampoo.   3. Using a clean washcloth, apply the Hibiclens to all areas, avoiding your face. Keep out of your eyes, ears and mouth. Make sure to wash your armpits, behind your ears and your  knees, your groin area, and between any skin folds. The soap will not bubble or lather very much, and that is fine.    If you get the soap in your eyes, ears or mouth - rinse well with cool water.   4. When youve covered your whole body with the soap, do not rinse, but replenish the Hibiclens on your washcloth and repeat step 3.   5. When finished, leave the soap on your skin for 2 minutes.   6. Rinse the Hibiclens off your skin thoroughly.   Do not wash with any other soap or cleanser.   7. Dry off with a clean towel and put on clean clothing.   8. Using lotion for dry skin is OK, but do not use lotion if you are having a surgical procedure.

## 2020-07-30 ENCOUNTER — Encounter (HOSPITAL_COMMUNITY): Payer: Self-pay | Admitting: Orthopaedic Surgery of the Spine

## 2020-08-02 ENCOUNTER — Other Ambulatory Visit: Payer: Self-pay

## 2020-08-03 ENCOUNTER — Encounter (HOSPITAL_BASED_OUTPATIENT_CLINIC_OR_DEPARTMENT_OTHER): Payer: Self-pay | Admitting: Family

## 2020-08-03 ENCOUNTER — Ambulatory Visit: Payer: Medicare Other | Attending: Family | Admitting: Family

## 2020-08-03 ENCOUNTER — Other Ambulatory Visit: Payer: Self-pay

## 2020-08-03 ENCOUNTER — Ambulatory Visit (HOSPITAL_BASED_OUTPATIENT_CLINIC_OR_DEPARTMENT_OTHER): Payer: Medicare Other | Admitting: Vascular Surgery

## 2020-08-03 ENCOUNTER — Telehealth (HOSPITAL_BASED_OUTPATIENT_CLINIC_OR_DEPARTMENT_OTHER): Payer: Self-pay | Admitting: Vascular Surgery

## 2020-08-03 DIAGNOSIS — Z981 Arthrodesis status: Secondary | ICD-10-CM | POA: Insufficient documentation

## 2020-08-03 DIAGNOSIS — M47816 Spondylosis without myelopathy or radiculopathy, lumbar region: Secondary | ICD-10-CM

## 2020-08-03 DIAGNOSIS — M4310 Spondylolisthesis, site unspecified: Secondary | ICD-10-CM | POA: Insufficient documentation

## 2020-08-03 DIAGNOSIS — M5416 Radiculopathy, lumbar region: Secondary | ICD-10-CM | POA: Insufficient documentation

## 2020-08-03 DIAGNOSIS — Z01818 Encounter for other preprocedural examination: Secondary | ICD-10-CM | POA: Insufficient documentation

## 2020-08-03 NOTE — Progress Notes (Signed)
Patient rescheduled for in person evaluation.

## 2020-08-03 NOTE — Telephone Encounter (Signed)
Outgoing call to patient to schedule 1 week in clinic follow up. Spoke with patient and scheduled patient at 4:15pm with Dr. Orene Desanctis. Provided patient with clinic address and patient confirmed appointment. Thank you.

## 2020-08-03 NOTE — Progress Notes (Unsigned)
Video visit    Date: August 03, 2020   Patient Name: Stephen [name]   Medical Record #: [id number]   DOB: Apr 10, 1951  Age: 69 year old  Sex: male    Referring MD:  Betsey Amen, MD  [address]  [place],  [place] [address]    Reason for Visit  Chief Complaint   Patient presents with   . 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 Here for consult for ALIF approach.   Stage 1: ALIF L5-S1 ; Stage 2 revision laminectomy and PSIF L3 thru S1 by Dr Zlomislic on 9/56/21.     Prior abdominal surgery: appendectomy. 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   Prior spine surgery: lumbar fusion 08/2019    MI or CVA: denies  Prior vascular disease: denies    5"7- 211lbs        Past Medical History  Past Medical History:   Diagnosis Date   . Chronic suprapubic catheter (CMS-HCC)    . Congenital hydronephrosis    . Gout    . Headache    . Hematuria    . Kidney disease    . Kidney stones    . Major depressive disorder, single episode    . Polyarthropathy or polyarthritis of multiple sites    . Retinal detachment    . Urethral stricture        Past Surgical History  Past Surgical History:   Procedure Laterality Date   . CT INSERTION OF SUPRAPUBIC CATH  09/25/2015   . NEPHRECTOMY Right 1995   . APPENDECTOMY     . COLONOSCOPY     . CYSTOSCOPY     . CYSTOSCOPY W/ LASER LITHOTRIPSY     . OTHER SURGICAL HISTORY      Interstim 01/29/2011   . TRANSURETHRAL RESECTION OF PROSTATE         Allergies  Allergies   Allergen Reactions   . Amoxicillin Rash   . Sulfa Drugs Unspecified       Medications  Current Outpatient Medications   Medication Sig Dispense Refill   . acetaminophen (TYLENOL) 325 MG tablet Take 2 tablets (650 mg) by mouth every 6 hours as needed for Mild Pain (Pain Score 1-3). 90 tablet 0   . ALAWAY 0.025 % ophthalmic solution INSTILL 1 DROP  INTO BOTH EYES TWICE A DAY AS DIRECTED     . allopurinol (ZYLOPRIM) 300 MG tablet Take 300 mg by mouth daily.     Marland Kitchen amLODIPINE (NORVASC) 5 MG tablet Take 10 mg by mouth daily.     . Cetirizine HCl (ZYRTEC PO) 10 mg.      . chlorhexidine (HIBICLENS) 4 % liquid Take two showers the day before surgery (morning and evening) and one the morning of.  Rinse thoroughly. 240 mL 0   . ciclopirox (LOPROX) 0.77 % GEL Apply topically 2 times daily.     Derrill Memo ON 08/13/2020] mupirocin (BACTROBAN) 2 % ointment Apply 1 Application topically 2 times daily. Use a small amount as directed 1 each 0   . omeprazole (PRILOSEC) 20 MG capsule Take 20 mg by mouth daily.     [name] Glycol-Propyl Glycol (SYSTANE ULTRA OP)      .  pregabalin (LYRICA) 75 MG capsule Take 1 capsule (75 mg) by mouth 2 times daily. 60 capsule 0   . triamcinolone (KENALOG) 0.1 % cream Apply 1 Application topically 2 times daily. Apply a thin layer as directed     . VITAMIN D PO Take 500 mg by mouth daily.      Marland Kitchen zolpidem (AMBIEN) 5 MG tablet Take 1 tablet (5 mg) by mouth nightly as needed for Insomnia. 30 tablet 0     No current facility-administered medications for this visit.       Social History  Social History     Socioeconomic History   . Marital status: Single     Spouse name: Not on file   . Number of children: Not on file   . Years of education: Not on file   . Highest education level: Not on file   Occupational History   . Not on file   Tobacco Use   . Smoking status: Never Smoker   . Smokeless tobacco: Never Used   Substance and Sexual Activity   . Alcohol use: No   . Drug use: Not Currently   . Sexual activity: Not Currently     Partners: Female   Social Activities of Daily Living Present   . Military Service No   . Blood Transfusions Yes   . Caffeine Concern No   . Occupational Exposure No   . Hobby Hazards No   . Sleep Concern Yes     Comment: due to my lower back discomfort and leg discomfort   . Stress Concern No   . Weight Concern No   . Special  Diet No   . Back Care Yes     Comment: Am very careful with any activity   . Exercises Regularly Yes   . Seat Belt Use Yes   . Performs Self-Exams Yes   Social History Narrative   . Not on file         Family History  Family History   Adopted: Yes   Family history unknown: Yes       Review of Systems    Pertinent items are noted in HPI. denies chest pain, Abdominal pain, SOB.    Physical Exam  None - video visit    Medical Decision Making  Test Results:      Impression:  Lumbar DDD    Plan:  Needs in person visit due to hx of abdominal surgery. Will review MRI with patient at that time.         Patient seen and evaluated with Dr. Orene Desanctis on video    Stephen Evener, NP  Vascular surgery    ---------------------(data below generated by Elmarie Mainland, NP)--------------------     Patient Verification & Telemedicine Consent & Financial Waiver:    1.   Identity: I have verified this patient's identity to be accurate.  2.   Consent: I verify consent has been secured in one of the following methods: (a) obtained written/ online attestation consent (via MyChartVideoVisit pathway), (b) the spoke-side provider has obtained verbal or written consent from patient/surrogate (if this is a "provider to provider" evaluation), or (c) in all other cases, I have personally obtained verbal consent from the patient/ surrogate (noting all elements below) to perform this voluntary telemedicine evaluation (including obtaining history, performing examination and reviewing data provided by the patient).   The patient/ surrogate has the right to refuse this evaluation.  I have explained risks (including potential loss  of confidentiality), benefits, alternatives, and the potential need for subsequent face to face care. Patient/ surrogate understands that there is a risk of medical inaccuracies given that our recommendations will be made based on reported data (and we must therefore assume this information is accurate).  Knowing that there  is a risk that this information is not reported accurately, and that the telemedicine video, audio, or data feed may be incomplete, the patient agrees to proceed with evaluation and holds [name] harmless knowing these risks.  3.   Healthcare Team: The patient/ surrogate has been notified that other healthcare professionals (including students, residents and Metallurgist) may be involved in this audio-video evaluation.   All laws concerning confidentiality and patient access to medical records and copies of medical records apply to telemedicine.  4.   Privacy: If this is a Radiographer, therapeutic Visit, the patient/ surrogate has received the [place] Notice of Privacy Practices via E-Checkin process.  For all other video visit techniques, I have verbally provided the patient/ surrogate with the [place] in [place] ([email or web address]) or Spanish ([email or web address]://[email or web address]).  The patient/ surrogate acknowledges both being provided the NPP link, and has been offered to have the NPP mailed to the patient/ surrogate by [name] mail.  The patient/ surrogate has voiced understanding an acknowledgement of receipt of this NPP web address.  If the patient/surrogate has elected to receive the NPP via [name] mail, I verify that the NPP will be sent promptly to the patient/surrogate via [name] mail.  5.   Capacity: I have reviewed this above verification and consent paragraph with the patient/ surrogate and the patient is capacitated or has a surrogate. If the patient is not capacitated to understand the above, and no surrogate is available, since this is not an emergency evaluation, the visit will be rescheduled until such time that the patient can consent, or the surrogate is available to consent. If this is an emergency evaluation and the patient is not capacitated to understand the above, and no surrogate is available, I am proceeding with this evaluation as this is felt to be an emergency  setting and no appropriate specialist is available at the bedside to perform these evaluations.  6.   Financial Waiver: If this is a Radiographer, therapeutic Visit, the patient has been made aware of the financial waiver via E-Checkin process.  For all other video visit techniques, an E-Checkin process is not performed.  As such, I have personally verbally informed the patient/ surrogate that this evaluation will be a billable encounter similar to an in-person clinic visit, and the patient/ surrogate has agreed to pay the fee for services rendered.  If we are billing insurance for the patient's telehealth visit, his out-of-pocket cost will be determined based on his plan and will be billed to him.  The patient/ surrogate has also been informed that if the patient does not have insurance or does not wish to use insurance, [facility] [employer] price for a primary care telehealth visit is $59.00 and specialist telehealth visit is $88.00.  I have further informed the patient/ surrogate that in the event the patient has additional services provided in conjunction with the specialty visit (Ex. Psychotherapy services), those services will be billed at the current rate less a 45% discount.  7.   Intra-State Location: The patient/ surrogate attests to understanding that if the patient accesses these services from a location outside of [place], that the patient does so at the patient's own risk and initiative  and that the patient is ultimately responsible for compliance with any laws or regulations associated with the patient's use.  8.   Specific Use:The patient/ surrogate understands that [facility] makes no representation that materials or servicesdelivered via telecommunication services, or listed on telemedicine websites, are appropriate or available for use in any other location.           Demographics:  Medical Record #: [id number]  Date: August 03, 2020  Patient Name: Stephen [name]  DOB: October 15, 1951  Age: 69 year old  Sex:  male  Location: Home address on file     Evaluator(s):  Stephen [name] was evaluated by Dr. Orene Desanctis and myself today.    Clinic Location: [facility]  [address]  [place] [address]

## 2020-08-03 NOTE — Anesthesia Preprocedure Evaluation (Addendum)
ANESTHESIA PRE-OPERATIVE EVALUATION    Patient Information    Name: Stephen [name]    MRN: [id number]    DOB: 01-Oct-1951    Age: 69 year old    Sex: male  Procedure(s):  Stage 1: Anterior lumbar interbody fusion with possible bone morphogenic protein, lumbar 5- sacral 1  Stage 2: Lumbar decompression and fusions with instrumentation, allograft versus autograft, lumbar 5-sacral 1  ANTERIOR SPINE EXPOSURE FOR ANTERIOR LUMBAR INTERBODY FUSION      Pre-op Vitals:   There were no vitals taken for this visit.        Primary language spoken:  English    ROS/Medical History:          General:  positive for Obesity,  able to climb flight of stairs/Exercise tolaerance >4 mets,  able to dress/bathe self,   Cardiovascular:  hypertension,  Walks to house and yard, stairs 2/2 back pain he is limited. Denies SOB or C/P   Anesthesia History:  negative anesthesia history ROS  no PONV,  Previous Grade III needed bougie Pulmonary:   negative pulmonary ROS  no sleep apnea,     Neuro/Psych:   psychiatric history,   Hematology/Oncology:   anemia (on past labs),  no history of cancer,      GI/Hepatic:  GERD,   Infectious Disease:  no hepatitis,  no HIV,     Renal:  H/o urethral stricture in past Endocrine/Other:  arthritis,   back pain,     Pregnancy History:   Pediatrics:         Pre Anesthesia Testing (PCC/CPC) notes/comments:                 Physical Exam    Airway:    Inter-inciser distance < 3 cm  Prognanth Able    Mallampati: III  Neck ROM: limited  TM distance: 4-5 cm  Short thick neck: No          Cardiovascular:    Rhythm: regular   Rate: normal         Pulmonary:       breath sounds clear to auscultation        Neuro/Neck/Skeletal/Skin:      Dental:    Comment: Multiple missing      Abdominal:      General: obesity     Additional Clinical Notes:               Last  OSA (STOP BANG) Score:  No data recorded    Last OSA  (STOP) Score for   No data recorded                 Past Medical History:   Diagnosis Date    Chronic  suprapubic catheter (CMS-HCC)     Congenital hydronephrosis     Gout     Headache     Hematuria     Kidney disease     Kidney stones     Major depressive disorder, single episode     Polyarthropathy or polyarthritis of multiple sites     Retinal detachment     Urethral stricture      Past Surgical History:   Procedure Laterality Date    CT INSERTION OF SUPRAPUBIC CATH  09/25/2015    NEPHRECTOMY Right 1995    APPENDECTOMY      COLONOSCOPY      CYSTOSCOPY      CYSTOSCOPY W/ LASER LITHOTRIPSY      OTHER SURGICAL HISTORY  Interstim 01/29/2011    TRANSURETHRAL RESECTION OF PROSTATE       Social History     Tobacco Use    Smoking status: Never Smoker    Smokeless tobacco: Never Used   Substance Use Topics    Alcohol use: No    Drug use: Not Currently        Alcohol Use:     Frequency of Alcohol Consumption:     Average Number of Drinks:     Frequency of Binge Drinking:        Current Outpatient Medications   Medication Sig Dispense Refill    acetaminophen (TYLENOL) 325 MG tablet Take 2 tablets (650 mg) by mouth every 6 hours as needed for Mild Pain (Pain Score 1-3). 90 tablet 0    ALAWAY 0.025 % ophthalmic solution INSTILL 1 DROP INTO BOTH EYES TWICE A DAY AS DIRECTED      allopurinol (ZYLOPRIM) 300 MG tablet Take 300 mg by mouth daily.      amLODIPINE (NORVASC) 5 MG tablet Take 10 mg by mouth daily.      Cetirizine HCl (ZYRTEC PO) 10 mg.       chlorhexidine (HIBICLENS) 4 % liquid Take two showers the day before surgery (morning and evening) and one the morning of.  Rinse thoroughly. 240 mL 0    ciclopirox (LOPROX) 0.77 % GEL Apply topically 2 times daily.      [START ON 08/13/2020] mupirocin (BACTROBAN) 2 % ointment Apply 1 Application topically 2 times daily. Use a small amount as directed 1 each 0    omeprazole (PRILOSEC) 20 MG capsule Take 20 mg by mouth daily.      Polyethyl Glycol-Propyl Glycol (SYSTANE ULTRA OP)       pregabalin (LYRICA) 75 MG capsule Take 1 capsule (75 mg) by  mouth 2 times daily. 60 capsule 0    triamcinolone (KENALOG) 0.1 % cream Apply 1 Application topically 2 times daily. Apply a thin layer as directed      VITAMIN D PO Take 500 mg by mouth daily.       zolpidem (AMBIEN) 5 MG tablet Take 1 tablet (5 mg) by mouth nightly as needed for Insomnia. 30 tablet 0     No current facility-administered medications for this visit.     Allergies   Allergen Reactions    Amoxicillin Rash    Sulfa Drugs Unspecified       Labs and Other Data  Lab Results   Component Value Date    NA 141 09/07/2019    K 4.7 09/07/2019    CL 105 09/07/2019    BICARB 22 09/07/2019    BUN 17 09/07/2019    CREAT 1.20 (H) 09/07/2019    GLU 174 (H) 09/07/2019    [place] 8.4 (L) 09/07/2019     Lab Results   Component Value Date    AST 27 03/03/2018    ALT 14 03/03/2018    ALK 74 03/03/2018    TP 7.7 03/03/2018    ALB 4.7 03/03/2018    TBILI 1.08 03/03/2018     Lab Results   Component Value Date    WBC 6.8 11/06/2019    RBC 3.95 (L) 11/06/2019    HGB 10.0 (L) 11/06/2019    HCT 33.3 (L) 11/06/2019    MCV 84.3 11/06/2019    MCHC 30.0 (L) 11/06/2019    RDW 15.9 (H) 11/06/2019    PLT 275 11/06/2019    MPV 10.6 11/06/2019  SEG 56 11/06/2019    LYMPHS 34 11/06/2019    MONOS 8 11/06/2019    EOS 1 11/06/2019    BASOS 1 11/06/2019     Lab Results   Component Value Date    INR 1.0 06/24/2018    PTT 32 06/24/2018     Lab Results   Component Value Date    ARTPH 7.32 (L) 09/06/2019    ARTPO2 206 (H) 09/06/2019    ARTPCO2 48 (H) 09/06/2019       Anesthesia Plan:  Risks and Benefits of Anesthesia  I have personally performed an appropriate pre-anesthesia physical exam of the patient (including heart, lungs, and airway) prior to the anesthetic and reviewed the pertinent medical history, drug and allergy history, laboratory and imaging studies and consultations.   I have determined that the patient has had adequate assessment and testing.  I have validated the documentation of these elements of the patient exam and/or have  made necessary changes to reflect my own observations during my pre-anesthesia exam.  Anesthetic techniques, invasive monitors, anesthetic drugs for induction, maintenance and post-operative analgesia, risks and alternatives have been explained to the patient and/or patient's representatives.    I have prescribed the anesthetic plan:         Planned anesthesia method: General         ASA 2 (Mild systemic disease)     Potential anesthesia problems identified and risks including but not limited to the following were discussed with patient and/or patient's representative: Adverse or allergic drug reaction, Recall, Dental injury or sore throat, Nerve injury, Injury to brain, heart and other organs and Patient declined further  discussion of risks of anesthesia        Planned monitoring method: Routine monitoring    Informed Consent:  Anesthetic plan and risks discussed with Patient.    Plan discussed with CRNA, Attending and Surgeon.

## 2020-08-03 NOTE — Patient Instructions (Signed)
PREOPERATIVE SURGICAL INFORMATION     Your surgery is currently scheduled at [facility] on 08/18/2020  The scheduler will be contacting you with the check in time      [facility], [place], [address] [place], [place] [address]   Please check in at Patient Services in [place] on 1st floor     [facility] (including [facility]): [employer] structure, Microbiologist structure, or Dance movement psychotherapist parking (7am-5pm at [employer]; 5am-5pm at [employer]) for same cost as self-parking in the front entrance of the [facility]   [email or web address]     QUESTIONS    If you have any questions between now and the day of your surgery, please do not hesitate to call:      [facility]: [phone]     DAY OF SURGERY ARRIVAL TIME:    On the day of your Surgery/Procedure, please arrive at the time provided by the surgery/preop team. If you have any questions regarding your arrival time, please call:     Preoperative Surgical Admissions at [facility]:         OK to take the following medications as scheduled with a small sip of water on the morning of surgery: Allopurinol, Lyrica, Omeprazole     PLEASE HOLD ALL NSAIDS (non-steroidal anti-inflammatory drugs) SUCH AS advil, aleve, motrin, ibuprofen, relafen, lodine, feldene, Diclofenac, voltaren, indomethacin, naproxen, celebrex, Mobic 7 days before surgery.        Please hold vitamins, supplements, herbs & fish oil 7 days before surgery. Vitamin D is OK     It is OK to take acetaminophen (Tylenol) for pain around the time of surgery unless you have liver disease.       AFTER YOUR VISIT WITH [name], IF YOU START TAKING A NEW MEDICATION BEFORE SURGERY, PLEASE CALL us TO MAKE SURE IT IS SAFE TO TAKE & WILL  NOT AFFECT YOUR SURGERY.         TO DO LIST:     Labs to be done prior to surgery:  Please go to the LAB. Locations and hours can be found at [email or web address] Call before you go as some locations require appointments.      EATING/DRINKING        DO NOT EAT OR DRINK ANYTHING AFTER MIDNIGHT ON THE DAY OF SURGERY    Preparing for your Surgery:      Please wear clean loose-fitting clothes and leave valuables at home    Do not shave or remove body hair. Facial shaving is permitted. If you are having head surgery, ask your doctor whether you can shave.   Bring a picture ID and your insurance card, and be prepared to pay your deductible or co-insurance by cash, check, or credit card when you arrive.    If you are going home after your surgery, please make sure to arrange for an adult to drive you home. You CANNOT use UBER or LYFT. If you do not have a ride, your surgery may be cancelled.     Visitor policy during the PXTGG-26 pandemic is subject to change. Current visitor policy can be found at [email or web address]     On The Day of Your Surgery:  Check in at the location mentioned above    If you are a woman of child bearing age, please note that you may be asked to give a urine sample upon check-in   You will meet your anesthesia and surgery teams in the preoperative holding area before surgery.    Once surgery is over, you will wake up in the recovery room.   If you go home, an adult chaperone will need to stay with you for the first 24 hours after surgery.    Visitor policy during the [id number] pandemic is subject to change. Current visitor policy can be found at [email or web address]    A video about what to expect for the day of surgery can be found here:    [email or web address]  Or by searching "You-tube" for [place] before surgery and [place] after surgery     You medical  records are available to you at [email or web address]

## 2020-08-10 ENCOUNTER — Ambulatory Visit: Payer: Medicare Other | Attending: Vascular Surgery | Admitting: Vascular Surgery

## 2020-08-10 VITALS — BP 129/73 | HR 67 | Temp 98.1°F | Ht 67.0 in | Wt 213.5 lb

## 2020-08-10 DIAGNOSIS — M48062 Spinal stenosis, lumbar region with neurogenic claudication: Secondary | ICD-10-CM

## 2020-08-10 NOTE — Progress Notes (Signed)
Vascular Surgery Consult Note    Reason for consult:  ALIF, anterior approach    History of present illness:  Stephen [name] is a 69 year old year-old male with history of degenerative disc disease who presents to discuss anterior lumbar interbody fusion. Patient has had multiple abdominal surgeries including appendectomy, right nephrectomy, and suprapubic catheter placement.  No longer has the suprapubic catheter. No midline or left sided surgeries.     Past Medical History:   Diagnosis Date    Chronic suprapubic catheter (CMS-HCC)     Congenital hydronephrosis     Gout     Headache     Hematuria     Kidney disease     Kidney stones     Major depressive disorder, single episode     Polyarthropathy or polyarthritis of multiple sites     Retinal detachment     Urethral stricture        Past Surgical History:   Procedure Laterality Date    CT INSERTION OF SUPRAPUBIC CATH  09/25/2015    NEPHRECTOMY Right 1995    APPENDECTOMY      COLONOSCOPY      CYSTOSCOPY      CYSTOSCOPY W/ LASER LITHOTRIPSY      OTHER SURGICAL HISTORY      Interstim 01/29/2011    TRANSURETHRAL RESECTION OF PROSTATE         Social History     Socioeconomic History    Marital status: Single     Spouse name: Not on file    Number of children: Not on file    Years of education: Not on file    Highest education level: Not on file   Occupational History    Not on file   Tobacco Use    Smoking status: Never Smoker    Smokeless tobacco: Never Used   Substance and Sexual Activity    Alcohol use: No    Drug use: Not Currently    Sexual activity: Not Currently     Partners: Female   Social Activities of Daily Living Present    Military Service No    Blood Transfusions Yes    Caffeine Concern No    Occupational Exposure No    Hobby Hazards No    Sleep Concern Yes     Comment: due to my lower back discomfort and leg discomfort    Stress Concern No    Weight Concern No    Special Diet No    Back Care Yes     Comment: Am  very careful with any activity    Exercises Regularly Yes    Seat Belt Use Yes    Performs Self-Exams Yes   Social History Narrative    Not on file        Family History   Adopted: Yes   Family history unknown: Yes       Current Outpatient Medications   Medication Sig    acetaminophen (TYLENOL) 325 MG tablet Take 2 tablets (650 mg) by mouth every 6 hours as needed for Mild Pain (Pain Score 1-3).    ALAWAY 0.025 % ophthalmic solution INSTILL 1 DROP INTO BOTH EYES TWICE A DAY AS DIRECTED    allopurinol (ZYLOPRIM) 300 MG tablet Take 300 mg by mouth daily.    amLODIPINE (NORVASC) 5 MG tablet Take 10 mg by mouth daily.    Cetirizine HCl (ZYRTEC PO) 10 mg.     chlorhexidine (HIBICLENS) 4 % liquid Take two  showers the day before surgery (morning and evening) and one the morning of.  Rinse thoroughly.    ciclopirox (LOPROX) 0.77 % GEL Apply topically 2 times daily.    [START ON 08/13/2020] mupirocin (BACTROBAN) 2 % ointment Apply 1 Application topically 2 times daily. Use a small amount as directed    omeprazole (PRILOSEC) 20 MG capsule Take 20 mg by mouth daily.    Polyethyl Glycol-Propyl Glycol (SYSTANE ULTRA OP)     pregabalin (LYRICA) 75 MG capsule Take 1 capsule (75 mg) by mouth 2 times daily.    triamcinolone (KENALOG) 0.1 % cream Apply 1 Application topically 2 times daily. Apply a thin layer as directed    VITAMIN D PO Take 500 mg by mouth daily.     zolpidem (AMBIEN) 5 MG tablet Take 1 tablet (5 mg) by mouth nightly as needed for Insomnia.     No current facility-administered medications for this visit.       Allergies   Allergen Reactions    Amoxicillin Rash    Sulfa Drugs Unspecified       Review of Systems  Negative per HPI;   Constitutional: negative for night sweats, weight loss, malaise.   HEENT: negative for headaches, vision problems   CV: negative for chest pain   Resp: no SOB   GI: negative for diarrhea   GU: negative for dysuria, hematuria.     Exam:  BP 129/73 (BP Location: Left arm,  BP Patient Position: Sitting, BP cuff size: Regular)    Pulse 67    Temp 98.1 F (36.7 C) (Temporal)    Ht 5\' 7"  (1.702 m)    Wt 96.8 kg (213 lb 8 oz)    SpO2 99%    BMI 33.44 kg/m   A0x3, NAD  Breathing unlabored on RA  Soft abd, central adiposity. Well healed right flank incision.   No pedal edema    Labs:  Lab Results   Component Value Date    WBC 6.8 11/06/2019    BAND 5 03/03/2018    HGB 10.0 (L) 11/06/2019    HCT 33.3 (L) 11/06/2019    PLT 275 11/06/2019     @IPBRIEFLAB (INR,PTT)@  Lab Results   Component Value Date    NA 141 09/07/2019    K 4.7 09/07/2019    CL 105 09/07/2019    BICARB 22 09/07/2019    BUN 17 09/07/2019    CREAT 1.20 (H) 09/07/2019    GLU 174 (H) 09/07/2019    [place] 8.4 (L) 09/07/2019    MG 2.0 03/03/2018       Imaging:  MRI Lumbar spine:   L4-L5: Postsurgical level. Minimal osseous fusion. Improved patency of the spinal canal, with mild canal stenosis secondary to aforementioned collection. Suboptimally characterized neural foramina. Probable mild-to-moderate right and up-to-moderate left foraminal stenosis with contact of the exiting left L4 nerve root, with possible slight mass effect; foraminal findings are not well characterized due to susceptibility artifact, though appeared to significantly improved compared to the pre-surgical study.    L5-S1: New grade 1 anterolisthesis with small disc bulge/uncovering. Increased now severe facet arthropathy. Increased moderate-to-severe right foraminal stenosis with contact of the exiting right L5 nerve root, though without definite compression. Increased moderate-to-severe left neural foramen stenosis with contact and possible slight mass effect on the exiting left L5 nerve root. Foramina are suboptimally characterized due to hardware artifact.      Assessment & Recommendations:  69 year old year-old male with history of degenerative  disc disease who presents to discuss anterior lumbar interbody fusion. Plan for 1-level fusion (L5-S1). Imaging looks  like it is a level amenable to anterior approach. Anticipate some retroperitoneal scarring from previous nephrectomy. Encourage weight loss before surgery.    - Plan for ALIF L5-S1  - Encourage weight loss    Dawson Bills, MD (he/him)  General Surgery, PGY-6

## 2020-08-10 NOTE — Progress Notes (Signed)
I have personally seen Stephen [name] with [facility].  I agree with the note written by the nurse practioner/resident/medical student.  The patient presents with a history of lumbar fusion with adjacent segment degeneration.   Request 5-1 ALIF, Z.  Pertinent physical exam findings are BMI 33, R nephrectomy, SP tube scar.  Radiologic exams show MRI with previous fusion, anatomy adequate.  My assessment is 5-1 ALIF, previous PLIF.  My plan for care is OK to proceed.    Betsey Amen MD, FACS  Professor of Surgery  [facility]

## 2020-08-11 ENCOUNTER — Other Ambulatory Visit: Payer: Self-pay

## 2020-08-13 ENCOUNTER — Other Ambulatory Visit (INDEPENDENT_AMBULATORY_CARE_PROVIDER_SITE_OTHER): Payer: Self-pay | Admitting: Orthopaedic Surgery of the Spine

## 2020-08-13 DIAGNOSIS — M5416 Radiculopathy, lumbar region: Secondary | ICD-10-CM

## 2020-08-13 DIAGNOSIS — M4310 Spondylolisthesis, site unspecified: Secondary | ICD-10-CM

## 2020-08-13 DIAGNOSIS — G4700 Insomnia, unspecified: Secondary | ICD-10-CM

## 2020-08-14 ENCOUNTER — Encounter (HOSPITAL_BASED_OUTPATIENT_CLINIC_OR_DEPARTMENT_OTHER): Payer: Self-pay | Admitting: Physician Assistant

## 2020-08-15 ENCOUNTER — Other Ambulatory Visit (INDEPENDENT_AMBULATORY_CARE_PROVIDER_SITE_OTHER): Payer: Medicare Other

## 2020-08-15 DIAGNOSIS — M4722 Other spondylosis with radiculopathy, cervical region: Secondary | ICD-10-CM | POA: Diagnosis present

## 2020-08-15 DIAGNOSIS — M5416 Radiculopathy, lumbar region: Secondary | ICD-10-CM | POA: Insufficient documentation

## 2020-08-15 DIAGNOSIS — M48062 Spinal stenosis, lumbar region with neurogenic claudication: Secondary | ICD-10-CM | POA: Diagnosis present

## 2020-08-15 DIAGNOSIS — D62 Acute posthemorrhagic anemia: Secondary | ICD-10-CM | POA: Diagnosis not present

## 2020-08-15 DIAGNOSIS — M4317 Spondylolisthesis, lumbosacral region: Secondary | ICD-10-CM | POA: Diagnosis present

## 2020-08-15 DIAGNOSIS — Z87442 Personal history of urinary calculi: Secondary | ICD-10-CM

## 2020-08-15 DIAGNOSIS — M48061 Spinal stenosis, lumbar region without neurogenic claudication: Secondary | ICD-10-CM | POA: Diagnosis present

## 2020-08-15 DIAGNOSIS — M130 Polyarthritis, unspecified: Secondary | ICD-10-CM | POA: Diagnosis present

## 2020-08-15 DIAGNOSIS — Z6831 Body mass index (BMI) 31.0-31.9, adult: Secondary | ICD-10-CM

## 2020-08-15 DIAGNOSIS — M4726 Other spondylosis with radiculopathy, lumbar region: Principal | ICD-10-CM | POA: Diagnosis present

## 2020-08-15 DIAGNOSIS — Z9359 Other cystostomy status: Secondary | ICD-10-CM

## 2020-08-15 DIAGNOSIS — Z79899 Other long term (current) drug therapy: Secondary | ICD-10-CM

## 2020-08-15 DIAGNOSIS — G4700 Insomnia, unspecified: Secondary | ICD-10-CM | POA: Diagnosis present

## 2020-08-15 DIAGNOSIS — Z905 Acquired absence of kidney: Secondary | ICD-10-CM

## 2020-08-15 DIAGNOSIS — E669 Obesity, unspecified: Secondary | ICD-10-CM | POA: Diagnosis present

## 2020-08-15 DIAGNOSIS — M4310 Spondylolisthesis, site unspecified: Secondary | ICD-10-CM | POA: Insufficient documentation

## 2020-08-15 DIAGNOSIS — M2578 Osteophyte, vertebrae: Secondary | ICD-10-CM | POA: Diagnosis present

## 2020-08-15 DIAGNOSIS — I10 Essential (primary) hypertension: Secondary | ICD-10-CM | POA: Diagnosis present

## 2020-08-15 LAB — COVID-19 CORONAVIRUS DETECTION ASSAY AT ~~LOC~~ LAB: COVID-19 Coronavirus Result: NOT DETECTED

## 2020-08-17 ENCOUNTER — Encounter: Payer: Self-pay | Admitting: Hospital

## 2020-08-17 ENCOUNTER — Encounter (HOSPITAL_BASED_OUTPATIENT_CLINIC_OR_DEPARTMENT_OTHER): Payer: Self-pay | Admitting: Nurse Practitioner

## 2020-08-17 ENCOUNTER — Encounter (INDEPENDENT_AMBULATORY_CARE_PROVIDER_SITE_OTHER): Payer: Self-pay | Admitting: Hospital

## 2020-08-17 ENCOUNTER — Other Ambulatory Visit (HOSPITAL_BASED_OUTPATIENT_CLINIC_OR_DEPARTMENT_OTHER): Payer: Self-pay | Admitting: Orthopaedic Surgery of the Spine

## 2020-08-17 DIAGNOSIS — R52 Pain, unspecified: Secondary | ICD-10-CM

## 2020-08-17 DIAGNOSIS — G6289 Other specified polyneuropathies: Secondary | ICD-10-CM

## 2020-08-17 NOTE — Telephone Encounter (Signed)
Duplicate message. 

## 2020-08-17 NOTE — Telephone Encounter (Signed)
From: Lovena Le  To: Sheria Lang, NP  Sent: 08/17/2020 8:51 AM PDT  Subject: 20-Other    trying to get refills been out of Lyrica and Ambien a week now 11/08/1951

## 2020-08-17 NOTE — Telephone Encounter (Signed)
From: Lovena Le  To: Scharlene Corn, PA  Sent: 08/14/2020 1:22 PM PDT  Subject: 20-Other    PT of Dr Illa Level trying to get refills on my Lyrica and Ambien been out all week [phone] [birth year]53/05/[birth year] thanks

## 2020-08-17 NOTE — Telephone Encounter (Signed)
Called [facility].    Patient is due for     Lyrica 75mg  1 pill twice daily # 60    Ambien 5mg  #30 1 pill at bedtime as needed.    Attempted to tee up order, unable, routed to [facility]

## 2020-08-18 ENCOUNTER — Inpatient Hospital Stay
Admission: RE | Admit: 2020-08-18 | Discharge: 2020-08-22 | DRG: 454 | Disposition: A | Discharge status: Home Health | Payer: Medicare Other | Attending: Orthopaedic Surgery of the Spine | Admitting: Orthopaedic Surgery of the Spine

## 2020-08-18 ENCOUNTER — Encounter (HOSPITAL_COMMUNITY)
Admission: RE | Disposition: A | Discharge status: Home Health | Payer: Self-pay | Attending: Orthopaedic Surgery of the Spine

## 2020-08-18 ENCOUNTER — Inpatient Hospital Stay (HOSPITAL_COMMUNITY): Payer: Medicare Other | Admitting: Anesthesiology

## 2020-08-18 ENCOUNTER — Other Ambulatory Visit (HOSPITAL_BASED_OUTPATIENT_CLINIC_OR_DEPARTMENT_OTHER): Payer: Self-pay

## 2020-08-18 ENCOUNTER — Inpatient Hospital Stay (HOSPITAL_COMMUNITY): Payer: Medicare Other | Admitting: Family

## 2020-08-18 ENCOUNTER — Inpatient Hospital Stay (HOSPITAL_BASED_OUTPATIENT_CLINIC_OR_DEPARTMENT_OTHER): Payer: Medicare Other

## 2020-08-18 DIAGNOSIS — D62 Acute posthemorrhagic anemia: Secondary | ICD-10-CM

## 2020-08-18 DIAGNOSIS — M4310 Spondylolisthesis, site unspecified: Secondary | ICD-10-CM | POA: Insufficient documentation

## 2020-08-18 DIAGNOSIS — M130 Polyarthritis, unspecified: Secondary | ICD-10-CM | POA: Diagnosis present

## 2020-08-18 DIAGNOSIS — Z9359 Other cystostomy status: Secondary | ICD-10-CM

## 2020-08-18 DIAGNOSIS — G6289 Other specified polyneuropathies: Secondary | ICD-10-CM

## 2020-08-18 DIAGNOSIS — Z789 Other specified health status: Secondary | ICD-10-CM

## 2020-08-18 DIAGNOSIS — M546 Pain in thoracic spine: Secondary | ICD-10-CM | POA: Insufficient documentation

## 2020-08-18 DIAGNOSIS — M2578 Osteophyte, vertebrae: Secondary | ICD-10-CM | POA: Diagnosis present

## 2020-08-18 DIAGNOSIS — M48061 Spinal stenosis, lumbar region without neurogenic claudication: Secondary | ICD-10-CM

## 2020-08-18 DIAGNOSIS — K5900 Constipation, unspecified: Secondary | ICD-10-CM

## 2020-08-18 DIAGNOSIS — M4726 Other spondylosis with radiculopathy, lumbar region: Secondary | ICD-10-CM

## 2020-08-18 DIAGNOSIS — Z87442 Personal history of urinary calculi: Secondary | ICD-10-CM

## 2020-08-18 DIAGNOSIS — Z981 Arthrodesis status: Secondary | ICD-10-CM

## 2020-08-18 DIAGNOSIS — R52 Pain, unspecified: Secondary | ICD-10-CM

## 2020-08-18 DIAGNOSIS — I10 Essential (primary) hypertension: Secondary | ICD-10-CM | POA: Diagnosis present

## 2020-08-18 DIAGNOSIS — Z7409 Other reduced mobility: Secondary | ICD-10-CM

## 2020-08-18 DIAGNOSIS — G4700 Insomnia, unspecified: Secondary | ICD-10-CM

## 2020-08-18 DIAGNOSIS — M5117 Intervertebral disc disorders with radiculopathy, lumbosacral region: Secondary | ICD-10-CM

## 2020-08-18 DIAGNOSIS — E669 Obesity, unspecified: Secondary | ICD-10-CM | POA: Diagnosis present

## 2020-08-18 DIAGNOSIS — Z79899 Other long term (current) drug therapy: Secondary | ICD-10-CM

## 2020-08-18 DIAGNOSIS — M4317 Spondylolisthesis, lumbosacral region: Secondary | ICD-10-CM | POA: Diagnosis present

## 2020-08-18 DIAGNOSIS — Z905 Acquired absence of kidney: Secondary | ICD-10-CM

## 2020-08-18 DIAGNOSIS — M5416 Radiculopathy, lumbar region: Secondary | ICD-10-CM | POA: Insufficient documentation

## 2020-08-18 DIAGNOSIS — G8929 Other chronic pain: Secondary | ICD-10-CM | POA: Insufficient documentation

## 2020-08-18 DIAGNOSIS — Z6831 Body mass index (BMI) 31.0-31.9, adult: Secondary | ICD-10-CM

## 2020-08-18 DIAGNOSIS — Z472 Encounter for removal of internal fixation device: Secondary | ICD-10-CM

## 2020-08-18 LAB — ABG+O2HBA+O2S A+O2CNA
Arterial pF Ratio: 308 mmHg
Arterial pF Ratio: 454 mmHg
Arterial pF Ratio: 373 mmHg
BE, Art: 0.5 mmol/L (ref <=1.2)
BE, Art: 0.1 mmol/L (ref <=1.2)
BE, Art: -1.6 mmol/L (ref <=1.2)
FIO2: 50 %
FIO2: 100 %
FIO2: 60 %
HCO3, Art: 25 mmol/L (ref 23–29)
HCO3, Art: 25 mmol/L (ref 23–29)
HCO3, Art: 24 mmol/L (ref 23–29)
Hct (Est), Art: 38 % — ABNORMAL LOW (ref 40–50)
Hct (Est), Art: 37 % — ABNORMAL LOW (ref 40–50)
Hct (Est), Art: 34 % — ABNORMAL LOW (ref 40–50)
Hgb, Art: 12.6 g/dL — ABNORMAL LOW (ref 14.0–17.0)
Hgb, Art: 12.3 g/dL — ABNORMAL LOW (ref 14.0–17.0)
Hgb, Art: 11.3 g/dL — ABNORMAL LOW (ref 14.0–17.0)
O2 Content, Art: 17.6 vol % (ref 15.0–23.0)
O2 Content, Art: 18 vol % (ref 15.0–23.0)
O2 Content, Art: 16 vol % (ref 15.0–23.0)
O2 Hgb, Art: 97.8 — ABNORMAL HIGH (ref 95.0–97.0)
O2 Hgb, Art: 96.9 (ref 95.0–97.0)
O2 Hgb, Art: 97.6 — ABNORMAL HIGH (ref 95.0–97.0)
O2 Sat, Art: 100.4 % — ABNORMAL HIGH (ref 94.0–100.0)
O2 Sat, Art: 100.2 % — ABNORMAL HIGH (ref 94.0–100.0)
O2 Sat, Art: 100.2 % — ABNORMAL HIGH (ref 94.0–100.0)
Temp: 35.5 'C
Temp: 36.1 'C
Temp: 35.7 'C
pCO2, Art (T): 35 mmHg — ABNORMAL LOW (ref 36–46)
pCO2, Art (T): 47 mmHg — ABNORMAL HIGH (ref 36–46)
pCO2, Art (T): 35 mmHg — ABNORMAL LOW (ref 36–46)
pCO2, Art (Uncorr): 37 mmHg (ref 36–46)
pCO2, Art (Uncorr): 49 mmHg (ref 36–46)
pCO2, Art (Uncorr): 37 mmHg (ref 36–46)
pH, Art (T): 7.45 (ref 7.35–7.46)
pH, Art (T): 7.35 (ref 7.35–7.46)
pH, Art (T): 7.42 (ref 7.35–7.46)
pH, Art (Uncorr): 7.43 (ref 7.35–7.46)
pH, Art (Uncorr): 7.34 (ref 7.35–7.46)
pH, Art (Uncorr): 7.4 (ref 7.35–7.46)
pO2, Art (T): 145 mmHg — ABNORMAL HIGH (ref 74–109)
pO2, Art (T): 448 mmHg — ABNORMAL HIGH (ref 74–109)
pO2, Art (T): 218 mmHg — ABNORMAL HIGH (ref 74–109)
pO2, Art (Uncorr): 154 mmHg (ref 74–109)
pO2, Art (Uncorr): 454 mmHg (ref 74–109)
pO2, Art (Uncorr): 224 mmHg (ref 74–109)

## 2020-08-18 LAB — COMPREHENSIVE METABOLIC PANEL, BLOOD
ALT (SGPT): 9 U/L (ref 0–41)
AST (SGOT): 27 U/L (ref 0–40)
Albumin: 4.7 g/dL (ref 3.5–5.2)
Alkaline Phos: 64 U/L (ref 40–129)
Anion Gap: 14 mmol/L (ref 7–15)
BUN: 14 mg/dL (ref 8–23)
Bicarbonate: 22 mmol/L (ref 22–29)
Bilirubin, Tot: 1 mg/dL (ref <1.2)
Calcium: 8.6 mg/dL (ref 8.5–10.6)
Chloride: 105 mmol/L (ref 98–107)
Creatinine: 1.03 mg/dL (ref 0.67–1.17)
GFR: >60 mL/min
Glucose: 185 mg/dL — ABNORMAL HIGH (ref 70–99)
Potassium: 4.3 mmol/L (ref 3.5–5.1)
Sodium: 141 mmol/L (ref 136–145)
Total Protein: 6.8 g/dL (ref 6.0–8.0)

## 2020-08-18 LAB — PROTHROMBIN TIME, BLOOD
INR: 1.2
PT,Patient: 12.6 s — ABNORMAL HIGH (ref 9.7–12.5)

## 2020-08-18 LAB — CBC WITH DIFF, BLOOD
ANC-Automated: 10.8 10*3/uL — ABNORMAL HIGH (ref 1.6–7.0)
Abs Basophils: 0 10*3/uL
Abs Eosinophils: 0 10*3/uL (ref 0.0–0.5)
Abs Lymphs: 0.7 10*3/uL — ABNORMAL LOW (ref 0.8–3.1)
Abs Monos: 0.6 10*3/uL (ref 0.2–0.8)
Basophils: 0 %
Eosinophils: 0 %
Hct: 36.5 % — ABNORMAL LOW (ref 40.0–50.0)
Hgb: 12 gm/dL — ABNORMAL LOW (ref 13.7–17.5)
Lymphocytes: 6 %
MCH: 29.8 pg (ref 26.0–32.0)
MCHC: 32.9 g/dL (ref 32.0–36.0)
MCV: 90.6 um3 (ref 79.0–95.0)
MPV: 10.4 fL (ref 9.4–12.4)
Monocytes: 5 %
Plt Count: 178 10*3/uL (ref 140–370)
RBC: 4.03 10*6/uL — ABNORMAL LOW (ref 4.60–6.10)
RDW: 14.6 % — ABNORMAL HIGH (ref 12.0–14.0)
Segs: 89 %
WBC: 12.2 10*3/uL — ABNORMAL HIGH (ref 4.0–10.0)

## 2020-08-18 LAB — SODIUM, ART WHOLE BLOOD
Sodium, Art: 138 mmol/L (ref 135–145)
Sodium, Art: 138 mmol/L (ref 135–145)
Sodium, Art: 135 mmol/L (ref 135–145)

## 2020-08-18 LAB — CALCIUM, IONIZED, ARTERIAL
Ca Ionized, Art: 1.12 mmol/L — ABNORMAL LOW (ref 1.13–1.32)
Ca Ionized, Art: 1.11 mmol/L — ABNORMAL LOW (ref 1.13–1.32)
Ca Ionized, Art: 1.03 mmol/L — ABNORMAL LOW (ref 1.13–1.32)

## 2020-08-18 LAB — GLUCOSE, ARTERIAL WHOLE BLOOD
Glucose, Art: 146 mg/dL — ABNORMAL HIGH (ref 65–110)
Glucose, Art: 174 mg/dL — ABNORMAL HIGH (ref 65–110)
Glucose, Art: 171 mg/dL — ABNORMAL HIGH (ref 65–110)

## 2020-08-18 LAB — TYPE & SCREEN
ABO/RH: A POS
Antibody Screen: NEGATIVE

## 2020-08-18 LAB — POTASSIUM, ART WHOLE BLOOD
Potassium, Art: 3.6 mmol/L (ref 3.5–5.0)
Potassium, Art: 4 mmol/L (ref 3.5–5.0)
Potassium, Art: 4 mmol/L (ref 3.5–5.0)

## 2020-08-18 LAB — APTT, BLOOD: PTT: 28 s (ref 25–34)

## 2020-08-18 SURGERY — FUSION, SPINE, ANTERIOR SPINAL COLUMN, LUMBAR, ANTERIOR APPROACH
Anesthesia: General | Site: Spine Lumbar | Wound class: Class I (Clean)

## 2020-08-18 MED ORDER — BISACODYL 10 MG RE SUPP
10.0000 mg | Freq: Every day | RECTAL | Status: DC | PRN
Start: 2020-08-18 — End: 2020-08-22

## 2020-08-18 MED ORDER — ALBUMIN HUMAN 5 % IV SOLN
INTRAVENOUS | Status: DC | PRN
Start: 2020-08-18 — End: 2020-08-18

## 2020-08-18 MED ORDER — ONDANSETRON HCL 4 MG/2ML IV SOLN
4.0000 mg | Freq: Once | INTRAMUSCULAR | Status: DC | PRN
Start: 2020-08-18 — End: 2020-08-18

## 2020-08-18 MED ORDER — OXYCODONE HCL 10 MG OR TABS
10.0000 mg | ORAL_TABLET | ORAL | Status: DC | PRN
Start: 2020-08-18 — End: 2020-08-22
  Administered 2020-08-18 – 2020-08-22 (×12): 10 mg via ORAL
  Filled 2020-08-18 (×12): qty 1

## 2020-08-18 MED ORDER — PROPOFOL 200 MG/20ML IV EMUL
INTRAVENOUS | Status: DC | PRN
Start: 2020-08-18 — End: 2020-08-18
  Administered 2020-08-18: 160 mg via INTRAVENOUS

## 2020-08-18 MED ORDER — MINERAL OIL LIGHT OIL
TOPICAL_OIL | Status: DC | PRN
Start: 2020-08-18 — End: 2020-08-18
  Administered 2020-08-18: 1 via TOPICAL

## 2020-08-18 MED ORDER — ZOLPIDEM TARTRATE 5 MG OR TABS
5.0000 mg | ORAL_TABLET | Freq: Every evening | ORAL | 0 refills | Status: DC | PRN
Start: 2020-08-18 — End: 2021-12-01

## 2020-08-18 MED ORDER — SUGAMMADEX SODIUM 200 MG/2ML IV SOLN
INTRAVENOUS | Status: AC
Start: 2020-08-18 — End: ?
  Filled 2020-08-18: qty 2

## 2020-08-18 MED ORDER — FENTANYL CITRATE (PF) 50 MCG/ML IJ SOLN (WRAPPED RECORD) ~~LOC~~
50.0000 ug | INTRAMUSCULAR | Status: DC | PRN
Start: 2020-08-18 — End: 2020-08-18
  Administered 2020-08-18: 50 ug via INTRAVENOUS
  Filled 2020-08-18: qty 1

## 2020-08-18 MED ORDER — ROCURONIUM BROMIDE 100 MG/10ML IV SOLN
INTRAVENOUS | Status: DC | PRN
Start: 2020-08-18 — End: 2020-08-18
  Administered 2020-08-18: 30 mg via INTRAVENOUS
  Administered 2020-08-18: 70 mg via INTRAVENOUS
  Administered 2020-08-18: 30 mg via INTRAVENOUS
  Administered 2020-08-18: 15 mg via INTRAVENOUS

## 2020-08-18 MED ORDER — LACTATED RINGERS IV SOLN
INTRAVENOUS | Status: DC
Start: 2020-08-18 — End: 2020-08-18
  Administered 2020-08-18: 1 mL via INTRAVENOUS

## 2020-08-18 MED ORDER — FENTANYL CITRATE (PF) 100 MCG/2ML IJ SOLN
INTRAMUSCULAR | Status: AC
Start: 2020-08-18 — End: ?
  Filled 2020-08-18: qty 2

## 2020-08-18 MED ORDER — LIDOCAINE HCL 2 % IJ SOLN WRAPPED RECORD
INTRAMUSCULAR | Status: DC | PRN
Start: 2020-08-18 — End: 2020-08-18
  Administered 2020-08-18: 80 mg via INTRAVENOUS

## 2020-08-18 MED ORDER — THROMBIN (RECOMBINANT) 20000 UNIT EX SOLR
CUTANEOUS | Status: DC | PRN
Start: 2020-08-18 — End: 2020-08-18
  Administered 2020-08-18: 20000 [IU] via TOPICAL

## 2020-08-18 MED ORDER — OXYCODONE HCL 5 MG OR TABS
5.0000 mg | ORAL_TABLET | ORAL | Status: DC | PRN
Start: 2020-08-18 — End: 2020-08-22
  Administered 2020-08-22: 5 mg via ORAL
  Filled 2020-08-18: qty 1

## 2020-08-18 MED ORDER — ONDANSETRON HCL 4 MG/2ML IV SOLN
4.0000 mg | Freq: Four times a day (QID) | INTRAMUSCULAR | Status: DC | PRN
Start: 2020-08-18 — End: 2020-08-22

## 2020-08-18 MED ORDER — ROPIVACAINE HCL 5 MG/ML IJ SOLN
INTRAMUSCULAR | Status: AC
Start: 2020-08-18 — End: ?
  Filled 2020-08-18: qty 30

## 2020-08-18 MED ORDER — ALBUMIN HUMAN 5 % IV SOLN
INTRAVENOUS | Status: AC
Start: 2020-08-18 — End: ?
  Filled 2020-08-18: qty 1000

## 2020-08-18 MED ORDER — ACETAMINOPHEN 325 MG PO TABS
975.0000 mg | ORAL_TABLET | Freq: Three times a day (TID) | ORAL | Status: DC
Start: 2020-08-18 — End: 2020-08-22
  Administered 2020-08-18 – 2020-08-22 (×11): 975 mg via ORAL
  Filled 2020-08-18 (×11): qty 3

## 2020-08-18 MED ORDER — METOPROLOL TARTRATE 5 MG/5ML IV SOLN
5.0000 mg | INTRAVENOUS | Status: DC | PRN
Start: 2020-08-18 — End: 2020-08-18

## 2020-08-18 MED ORDER — VANCOMYCIN HCL 1 GM IV SOLR
INTRAVENOUS | Status: DC | PRN
Start: 2020-08-18 — End: 2020-08-18
  Administered 2020-08-18: 1000 mg via TOPICAL
  Administered 2020-08-18: 2000 mg via TOPICAL

## 2020-08-18 MED ORDER — PROPOFOL 1000 MG/100ML IV EMUL
INTRAVENOUS | Status: DC | PRN
Start: 2020-08-18 — End: 2020-08-18
  Administered 2020-08-18: 50 ug/kg/min via INTRAVENOUS
  Administered 2020-08-18: 100 ug/kg/min via INTRAVENOUS
  Administered 2020-08-18: 125 ug/kg/min via INTRAVENOUS
  Administered 2020-08-18: 75 ug/kg/min via INTRAVENOUS
  Administered 2020-08-18: 50 ug/kg/min via INTRAVENOUS
  Administered 2020-08-18: 100 ug/kg/min via INTRAVENOUS

## 2020-08-18 MED ORDER — BUPIVACAINE HCL (PF) 0.5 % IJ SOLN
INTRAMUSCULAR | Status: AC
Start: 2020-08-18 — End: ?
  Filled 2020-08-18: qty 20

## 2020-08-18 MED ORDER — ONDANSETRON HCL 4 MG/2ML IV SOLN
INTRAMUSCULAR | Status: DC | PRN
Start: 2020-08-18 — End: 2020-08-18
  Administered 2020-08-18 (×2): 4 mg via INTRAVENOUS

## 2020-08-18 MED ORDER — MIDAZOLAM HCL 2 MG/2ML IJ SOLN
INTRAMUSCULAR | Status: DC | PRN
Start: 2020-08-18 — End: 2020-08-18
  Administered 2020-08-18: 2 mg via INTRAVENOUS

## 2020-08-18 MED ORDER — OXYCODONE HCL 5 MG OR TABS
5.0000 mg | ORAL_TABLET | Freq: Once | ORAL | Status: DC | PRN
Start: 2020-08-18 — End: 2020-08-18

## 2020-08-18 MED ORDER — THROMBIN (RECOMBINANT) 20000 UNIT EX SOLR
CUTANEOUS | Status: AC
Start: 2020-08-18 — End: ?
  Filled 2020-08-18: qty 1

## 2020-08-18 MED ORDER — MIDAZOLAM HCL 2 MG/2ML IJ SOLN
INTRAMUSCULAR | Status: AC
Start: 2020-08-18 — End: ?
  Filled 2020-08-18: qty 2

## 2020-08-18 MED ORDER — HYDROMORPHONE HCL 1 MG/ML IJ SOLN
0.5000 mg | INTRAMUSCULAR | Status: AC | PRN
Start: 2020-08-18 — End: 2020-08-19

## 2020-08-18 MED ORDER — DEXAMETHASONE SODIUM PHOSPHATE 4 MG/ML IJ SOLN (CUSTOM)
INTRAMUSCULAR | Status: DC | PRN
Start: 2020-08-18 — End: 2020-08-18
  Administered 2020-08-18 (×2): 10 mg via INTRAVENOUS

## 2020-08-18 MED ORDER — HEPARIN SODIUM (PORCINE) 20000 UNIT/ML IJ SOLN
INTRAMUSCULAR | Status: DC | PRN
Start: 2020-08-18 — End: 2020-08-18
  Administered 2020-08-18 (×2): 1000 mL

## 2020-08-18 MED ORDER — POLYETHYLENE GLYCOL 3350 OR PACK
17.0000 g | PACK | Freq: Every day | ORAL | Status: DC
Start: 2020-08-19 — End: 2020-08-22
  Administered 2020-08-19 – 2020-08-22 (×3): 17 g via ORAL
  Filled 2020-08-18 (×3): qty 1

## 2020-08-18 MED ORDER — LIDOCAINE-EPINEPHRINE 1 %-1:100000 IJ SOLN
INTRAMUSCULAR | Status: AC
Start: 2020-08-18 — End: ?
  Filled 2020-08-18: qty 30

## 2020-08-18 MED ORDER — SODIUM CHLORIDE 0.9 % IV SOLN
1000.0000 mg | Freq: Three times a day (TID) | INTRAVENOUS | Status: DC
Start: 2020-08-18 — End: 2020-08-22
  Administered 2020-08-18 – 2020-08-22 (×13): 1000 mg via INTRAVENOUS
  Filled 2020-08-18 (×12): qty 1000

## 2020-08-18 MED ORDER — BUPIVACAINE HCL (PF) 0.5 % IJ SOLN
INTRAMUSCULAR | Status: AC
Start: 2020-08-18 — End: ?
  Filled 2020-08-18: qty 30

## 2020-08-18 MED ORDER — SODIUM CHLORIDE 0.9 % IV SOLN
INTRAVENOUS | Status: DC | PRN
Start: 2020-08-18 — End: 2020-08-18

## 2020-08-18 MED ORDER — SUGAMMADEX SODIUM 200 MG/2ML IV SOLN
INTRAVENOUS | Status: DC | PRN
Start: 2020-08-18 — End: 2020-08-18
  Administered 2020-08-18: 400 mg via INTRAVENOUS

## 2020-08-18 MED ORDER — SENNA 8.6 MG OR TABS
2.0000 | ORAL_TABLET | Freq: Every morning | ORAL | Status: DC
Start: 2020-08-19 — End: 2020-08-22
  Administered 2020-08-19 – 2020-08-22 (×4): 17.2 mg via ORAL
  Filled 2020-08-18 (×3): qty 2

## 2020-08-18 MED ORDER — HYDROMORPHONE HCL 1 MG/ML IJ SOLN
1.0000 mg | INTRAMUSCULAR | Status: AC | PRN
Start: 2020-08-18 — End: 2020-08-19
  Administered 2020-08-19: 1 mg via INTRAVENOUS
  Filled 2020-08-18: qty 1

## 2020-08-18 MED ORDER — MINERAL OIL LIGHT OIL
TOPICAL_OIL | Status: AC
Start: 2020-08-18 — End: ?
  Filled 2020-08-18: qty 20

## 2020-08-18 MED ORDER — LIDOCAINE HCL (PF) 1 % IJ SOLN
0.1000 mL | Freq: Once | INTRAMUSCULAR | Status: DC | PRN
Start: 2020-08-18 — End: 2020-08-18

## 2020-08-18 MED ORDER — FENTANYL CITRATE (PF) 50 MCG/ML IJ SOLN (WRAPPED RECORD) ~~LOC~~
25.0000 ug | INTRAMUSCULAR | Status: DC | PRN
Start: 2020-08-18 — End: 2020-08-18
  Administered 2020-08-18: 25 ug via INTRAVENOUS
  Filled 2020-08-18: qty 1

## 2020-08-18 MED ORDER — PHENYLEPHRINE HCL 10 MG/ML INJECTION WRAPPED RECORD
INTRAMUSCULAR | Status: DC | PRN
Start: 2020-08-18 — End: 2020-08-18
  Administered 2020-08-18: 10 ug/min via INTRAVENOUS
  Administered 2020-08-18: 20 ug/min via INTRAVENOUS

## 2020-08-18 MED ORDER — MEPERIDINE HCL 25 MG/ML IJ SOLN
12.5000 mg | INTRAMUSCULAR | Status: DC | PRN
Start: 2020-08-18 — End: 2020-08-18

## 2020-08-18 MED ORDER — MENTHOL 3 MG MT LOZG
1.0000 | LOZENGE | Freq: Three times a day (TID) | OROMUCOSAL | Status: DC | PRN
Start: 2020-08-18 — End: 2020-08-22

## 2020-08-18 MED ORDER — DOCUSATE SODIUM 100 MG OR CAPS
200.0000 mg | ORAL_CAPSULE | Freq: Every evening | ORAL | Status: DC
Start: 2020-08-18 — End: 2020-08-22
  Administered 2020-08-18 – 2020-08-21 (×3): 200 mg via ORAL
  Filled 2020-08-18 (×4): qty 2

## 2020-08-18 MED ORDER — DEXAMETHASONE SODIUM PHOSPHATE 4 MG/ML IJ SOLN (CUSTOM)
4.0000 mg | Freq: Four times a day (QID) | INTRAMUSCULAR | Status: DC
Start: 2020-08-18 — End: 2020-08-22
  Administered 2020-08-18 – 2020-08-22 (×15): 4 mg via INTRAVENOUS
  Filled 2020-08-18 (×15): qty 1

## 2020-08-18 MED ORDER — FENTANYL CITRATE (PF) 250 MCG/5ML IJ SOLN
INTRAMUSCULAR | Status: DC | PRN
Start: 2020-08-18 — End: 2020-08-18
  Administered 2020-08-18 (×3): 100 ug via INTRAVENOUS
  Administered 2020-08-18: 50 ug via INTRAVENOUS

## 2020-08-18 MED ORDER — PREGABALIN 75 MG OR CAPS
75.0000 mg | ORAL_CAPSULE | Freq: Two times a day (BID) | ORAL | 2 refills | Status: DC
Start: 2020-08-18 — End: 2020-09-30

## 2020-08-18 MED ORDER — DIPHENHYDRAMINE HCL 50 MG/ML IJ SOLN
12.5000 mg | Freq: Once | INTRAMUSCULAR | Status: DC | PRN
Start: 2020-08-18 — End: 2020-08-18

## 2020-08-18 MED ORDER — HYDROMORPHONE HCL 1 MG/ML IJ SOLN
0.5000 mg | INTRAMUSCULAR | Status: DC | PRN
Start: 2020-08-18 — End: 2020-08-18
  Administered 2020-08-18 (×3): 0.5 mg via INTRAVENOUS
  Filled 2020-08-18 (×3): qty 0.5

## 2020-08-18 MED ORDER — VANCOMYCIN HCL 1 GM IV SOLR
INTRAVENOUS | Status: AC
Start: 2020-08-18 — End: ?
  Filled 2020-08-18: qty 4000

## 2020-08-18 MED ORDER — EPHEDRINE SULFATE 50 MG/ML IJ SOLN
INTRAMUSCULAR | Status: DC | PRN
Start: 2020-08-18 — End: 2020-08-18
  Administered 2020-08-18: 10 mg via INTRAVENOUS

## 2020-08-18 MED ORDER — LACTATED RINGERS IV SOLN
INTRAVENOUS | Status: DC
Start: 2020-08-18 — End: 2020-08-18

## 2020-08-18 MED ORDER — MULTI-VITAMINS PO TABS
1.0000 | ORAL_TABLET | Freq: Every day | ORAL | Status: DC
Start: 2020-08-19 — End: 2020-08-22
  Administered 2020-08-19 – 2020-08-22 (×4): 1 via ORAL
  Filled 2020-08-18 (×4): qty 1

## 2020-08-18 MED ORDER — LACTATED RINGERS IV SOLN
INTRAVENOUS | Status: DC | PRN
Start: 2020-08-18 — End: 2020-08-18

## 2020-08-18 MED ORDER — LACTATED RINGERS IV SOLN
INTRAVENOUS | Status: DC
Start: 2020-08-18 — End: 2020-08-22

## 2020-08-18 MED ORDER — NALOXONE HCL 0.4 MG/ML IJ SOLN
0.1000 mg | INTRAMUSCULAR | Status: DC | PRN
Start: 2020-08-18 — End: 2020-08-18

## 2020-08-18 MED ORDER — HEPARIN SODIUM (PORCINE) 20000 UNIT/ML IJ SOLN
INTRAMUSCULAR | Status: AC
Start: 2020-08-18 — End: ?
  Filled 2020-08-18: qty 2

## 2020-08-18 MED ORDER — ACETAMINOPHEN 325 MG PO TABS
975.0000 mg | ORAL_TABLET | Freq: Once | ORAL | Status: AC
Start: 2020-08-18 — End: 2020-08-18
  Administered 2020-08-18: 975 mg via ORAL
  Filled 2020-08-18: qty 3

## 2020-08-18 MED ORDER — CEFAZOLIN SODIUM 1 GM IJ SOLR
INTRAMUSCULAR | Status: DC | PRN
Start: 2020-08-18 — End: 2020-08-18
  Administered 2020-08-18 (×2): 2000 mg via INTRAVENOUS

## 2020-08-18 MED ORDER — FENTANYL CITRATE (PF) 250 MCG/5ML IJ SOLN
INTRAMUSCULAR | Status: AC
Start: 2020-08-18 — End: ?
  Filled 2020-08-18: qty 5

## 2020-08-18 MED ORDER — GABAPENTIN 300 MG OR CAPS
300.0000 mg | ORAL_CAPSULE | Freq: Three times a day (TID) | ORAL | Status: DC
Start: 2020-08-18 — End: 2020-08-19
  Administered 2020-08-18 – 2020-08-19 (×2): 300 mg via ORAL
  Filled 2020-08-18 (×2): qty 1

## 2020-08-18 MED ORDER — LIDOCAINE-EPINEPHRINE 1 %-1:100000 IJ SOLN
INTRAMUSCULAR | Status: DC | PRN
Start: 2020-08-18 — End: 2020-08-18
  Administered 2020-08-18 (×2): 10 mL

## 2020-08-18 SURGICAL SUPPLY — 120 items
ALLGRAFT AMNIOBAND MEMBRANE 4CM X 4CM (Skin substitutes/matrix) ×4 IMPLANT
ASSEMBLY SUCTION AAL ASPIRATION AND ANTICOAGULATION LINE 1/4" (Perfusion supply) ×4 IMPLANT
BASIN SINGLE BASIC STERILE (Misc Surgical Supply) ×8 IMPLANT
BLADE SURGEON #10 STERILE (Knives/Blades) ×4 IMPLANT
BLANKET BAIR HUGGER LOWER BODY (Misc Medical Supply) ×4 IMPLANT
BRUSH SRGN SCRUB CHG 4% (Prep Solutions) ×4 IMPLANT
CAUTERY TIP EDGE BLADE ELECTRODE 2.75", INSULATED (Cautery) ×4 IMPLANT
CAUTERY TIP EDGE BLADE ELECTRODE 6.5", INSULATING (Cautery) ×4 IMPLANT
CLIP HEMOSTATIC HORIZON LRG, TITANIUM (6), ~~LOC~~ (Staplers and staple reloads) ×4 IMPLANT
CLIP HEMOSTATIC HORIZON MED/LRG, TITANIUM (6), GREEN (Staplers and staple reloads) ×4 IMPLANT
CLIP HEMOSTATIC HORIZON MEDIUM, TITANIUM (6), BLUE (Staplers and staple reloads) ×8 IMPLANT
CLIP HEMOSTATIC HORIZON SMALL, TITANIUM (6), YELLOW (Staplers and staple reloads) ×8 IMPLANT
CLIPPER BLADE ASSY FOR 9670 CLIPPER (Knives/Blades) ×4 IMPLANT
CONTAINER PRECISION SPECIMEN, 4OZ- STERILE (Misc Medical Supply) ×12 IMPLANT
CONTAINER SPECIMEN 4 OZ (Misc Medical Supply) ×16 IMPLANT
CORD VALLEYLAB BIPOLAR FORCEP- SINGE USE (Cautery) ×4 IMPLANT
COVER SNAP KOVER BANDED BAG 30" X 36" (Drapes/towels) ×4 IMPLANT
DBM PUTTY EVO3 ACELL 10ML (Bone/chips/putty) ×8 IMPLANT
DERMABOND ADVANCE 0.7ML (Suture) ×8 IMPLANT
DRAIN RELIAVAC FLAT 10MM X 20CM 0070440 (Lines/Drains) ×4 IMPLANT
DRAIN SUCTION EVAC RELIAVAC 100CC (Lines/Drains) ×4 IMPLANT
DRAPE C-ARMOR FLUOROSCAN IMAGING (Drapes/towels) ×4 IMPLANT
DRAPE ECOLAB SOLUTION WARMER 44" X 44" {ORS-100} (Drapes/towels) ×4 IMPLANT
DRESSING 4X4 SPONGE DRAIN - 6 PLY STRL (Dressings/packing) ×4 IMPLANT
DRESSING MEPILEX BORDER SACRUM 23 X 23CM (Dressings/packing) ×4 IMPLANT
DRESSING MEPILEX BORDER SACRUM SM 16 X 20CM (Dressings/packing) ×40 IMPLANT
DRESSING MEPILEX BORDER SILICONE 4" X 10" (Dressings/packing) ×8 IMPLANT
DRILL BIT STD SST TWIST 2 MM X 127MM (Drills/Bits/Burs/Taps/Reamers) ×24 IMPLANT
DRILL PRECISION NEURO 3.0 X 3.8MM (Drills/Bits/Burs/Taps/Reamers) ×4 IMPLANT
EQUIPMENT COVER SNAP KAP 18" DOME STERILE (Drapes/towels) IMPLANT
FORCEP BIPOLAR 8 1/2 1.0MM TIP SINGLE USE (Cautery) ×4 IMPLANT
FORCEP BIPOLAR BAYONET 10.5" 1.0MM TIP (Misc Medical Supply) ×4 IMPLANT
GLOVE BIOGEL INDICATOR UNDERGLOVE SIZE 7 (Gloves/Gowns) ×12 IMPLANT
GLOVE BIOGEL INDICATOR UNDERGLOVE SIZE 7.5 (Gloves/Gowns) ×8 IMPLANT
GLOVE BIOGEL INDICATOR UNDERGLOVE SIZE 8.5 (Gloves/Gowns) ×8 IMPLANT
GLOVE BIOGEL PI INDICATOR SIZE 7 (Gloves/Gowns) ×8 IMPLANT
GLOVE BIOGEL PI INDICATOR SIZE 8.5 (Gloves/Gowns) ×32 IMPLANT
GLOVE BIOGEL PI ULTRATOUCH SIZE 6.5 (Gloves/Gowns) ×12 IMPLANT
GLOVE BIOGEL PI ULTRATOUCH SIZE 8 (Gloves/Gowns) ×16 IMPLANT
GLOVE BIOGEL PI ULTRATOUCH SIZE 8.5 (Gloves/Gowns) ×20 IMPLANT
GLOVE SURGEON BIOGEL SIZE 7 (Gloves/Gowns) ×16 IMPLANT
GLOVE SURGEON BIOGEL SIZE 7.5 (Gloves/Gowns) ×20 IMPLANT
GLOVE SURGICAL BIOGEL SIZE 6.5 (Gloves/Gowns) ×20 IMPLANT
GLOVE SURGICAL BIOGEL SIZE 8 (Gloves/Gowns) ×8 IMPLANT
GLOVE SURGICAL BIOGEL SIZE 8.5 (Gloves/Gowns) ×12 IMPLANT
GOWN SURGICAL ULTRA XL BLUE, AAMI LVL 3 (Gloves/Gowns) ×28 IMPLANT
GOWN SURGICAL XXLG BLUE (Gloves/Gowns) ×8 IMPLANT
GRAFT VIVIGEN FORMABLE 5CC (Bone/chips/putty) ×4 IMPLANT
HEADREST PRONE POSITION (Patient Care Supply) IMPLANT
HEMOSTAT SURGICEL FIBRILLAR 4" X 4" (Hemostatic agents/wax/sealants-absorbable) ×4 IMPLANT
HEMOSTATIC MATRIX SURGIFLO W/O THROMBIN 2991 (Hemostatic agents/wax/sealants-absorbable) ×8 IMPLANT
HOVERMATT VELCRO SINGLE PATIENT USE 34" (NEW/REPROCESSED) (Patient Care Supply) IMPLANT
KIT INFUSE BONE GRAFT MEDIUM 5.6CC (Grafts) ×4 IMPLANT
KIT INFUSE BONE GRAFT SMALL (Grafts) ×4 IMPLANT
KIT JACKSON PRONE VIEW PATIENT CARE KIT (Patient Care Supply) IMPLANT
KIT PT CARE JACKSON TABLE (Kits/Sets/Trays) IMPLANT
LIGASURE MARYLAND JAW 23CM OPEN SEALER/ DIVIDER (Lap/Endo/Arthroscopy) IMPLANT
LOOP VESSEL VASC BLUE MAXI (Misc Surgical Supply) IMPLANT
LOOP VESSEL VASC RED MAXI (Misc Surgical Supply) IMPLANT
LOOP VESSEL VASC WHITE MINI SILICONE (Misc Surgical Supply) IMPLANT
MILL BONE STRYKER MEDIUM, DISP (Misc Surgical Supply) ×4 IMPLANT
PACK SPINAL POSITIONING (Procedure Packs/kits) ×4 IMPLANT
PACK SPINAL SURGERY (Procedure Packs/kits) ×4 IMPLANT
PAD GROUND VALLEYLAB REM ADULT E7507 (Misc Surgical Supply) ×12 IMPLANT
PEANUT SPONGES BIOSEAL IN HOLDER 5/PACK (Sponges) ×21 IMPLANT
PERF ONLY RESERVOIR BLOOD COLLECTION XRES* (Perfusion supply) ×4 IMPLANT
PERF ONLY XTRA VACUUM EXTENSION LINE VE* (Perfusion supply) ×4 IMPLANT
PROTECTOR ULNAR NERVE PAD, YELLOW (Misc Surgical Supply) ×8 IMPLANT
PUTTY MAGNETOS 10CC 1-2MM (Bone/chips/putty) ×8 IMPLANT
ROD SPINE EXPEDIUM 5.5 X 85MM PRE-BENT (Spine cages/spacers/discs/fixation) ×8 IMPLANT
SCREW SET EXPEDIUM 5.5 SINGLE INNER (Screws/anchors/cables) ×36 IMPLANT
SCREW SYNFIX EVOLUTION TI 4 X 25MM, LCK, FINE TIP (2 PCS) (Screws/anchors/cables) ×8 IMPLANT
SCREW VIPER FENESTRATED CORTICAL  6 X 40MM (Screws/anchors/cables) ×8 IMPLANT
SCREW VIPER FENESTRATED CORTICAL  7 X 50MM (Screws/anchors/cables) ×8 IMPLANT
SEALER AQUAMANTYS 2.3 BIPOLAR (Lap/Endo/Arthroscopy) ×4 IMPLANT
SENSOR ADULT ADHESIVE LNCS (Misc Medical Supply) ×4 IMPLANT
SLEEVE PROTECTION SYNFIX FOR SCREWDRIVER/AWL (Misc Surgical Supply) ×4 IMPLANT
SLEEVE SCD KNEE MEDIUM (Misc Medical Supply) ×4 IMPLANT
SLEEVE SYNFIX EVOLUTION THREAD LOCK (Disp Instruments) ×8 IMPLANT
SOLUTION IRR POUR BTL 0.9% NS 1000ML (Non-Pharmacy Meds/Solutions) ×12 IMPLANT
SOLUTION IRR POUR BTL H20 1000ML (Non-Pharmacy Meds/Solutions) ×4 IMPLANT
SOLUTION IV 0.9% NS 1000ML (Non-Pharmacy Meds/Solutions) ×4 IMPLANT
SPACER SYNFIX EVOLUTION MED 18°, A 19MM, P 10.9MM (Spine cages/spacers/discs/fixation) ×4 IMPLANT
SPONGE LAP RF DETECT 18" X 18" XRAY STERILE (Sponges) ×12 IMPLANT
SPONGE LAP RF DETECT 4" X 18" XRAY STERILE (5 PIECES) (Sponges) IMPLANT
SPONGE SURGIFOAM LARGE (GELFOAM) (Hemostatic agents/wax/sealants-absorbable) ×4 IMPLANT
SPONGES BIOSEAL PEANUT IN HOLDER 5/PACK (Sponges) ×28
STAPLER PROXIMATE SKIN 35 WIDE (Staplers and staple reloads) ×4 IMPLANT
STRAP POSITIONING KNEE/BODY, HOOK AND LOOP (Misc Surgical Supply) ×8 IMPLANT
SURGICAL PACK SPINAL STAGING (Procedure Packs/kits) ×4 IMPLANT
SUTURE BOOT YELLOW (Misc Medical Supply) IMPLANT
SUTURE ETHILON 2-0 30" FSLX (Suture) IMPLANT
SUTURE MONOCRYL 3-0 27" PS-2 Y427H (Suture) IMPLANT
SUTURE MONOCRYL PLUS 3-0 27" PS-2 (MCP427) (Suture) ×4 IMPLANT
SUTURE MONOCRYL PLUS 4-0 27" PS-2 MCP426 (Suture) ×8 IMPLANT
SUTURE PDS PLUS #1 96" LOOPED TP-1 (Suture) IMPLANT
SUTURE PDS PLUS 1 54" TP-1 (Suture) ×8 IMPLANT
SUTURE PERMA-HAND SILK 0 30" FS-1 A306H (Suture) ×4
SUTURE PERMA-HAND SILK 0 30" TIE (Suture) ×3 IMPLANT
SUTURE PERMA-HAND SILK 2-0 12-30" A305 (Suture) ×4 IMPLANT
SUTURE PERMA-HAND SILK 3-0 24" (Suture) ×4
SUTURE PERMA-HAND SILK 3-0 30" TIE (Suture) ×3 IMPLANT
SUTURE PROLENE 3-0 54" SH, DBL ARM (D5526) (Suture) IMPLANT
SUTURE PROLENE 4-0 36"" DBL BB (Suture) IMPLANT
SUTURE PROLENE 5-0 36"" DBL BB (Suture) IMPLANT
SUTURE PROLENE 6-0 24" BV-1 (Suture)
SUTURE PROLENE 6-0 24" BV-1 8805H (Suture) IMPLANT
SUTURE STRATAFIX PDS PLUS 1 CTX 45CM (Suture) IMPLANT
SUTURE VICRYL 2-0 36" CT-1 (Suture) ×2
SUTURE VICRYL 2-0 36" CT-1 VCP945H (Suture) ×6 IMPLANT
SUTURE VICRYL PLUS 0 18" MO-4 CR VCP701D (Suture) ×8 IMPLANT
SUTURE VICRYL PLUS 1 18" MO-4 CR VCP702D (Suture) ×8 IMPLANT
SUTURE VICRYL PLUS 2-0 18" CT-1 (Suture) ×2
SUTURE VICRYL PLUS 2-0 18" CT-1 VCP839D (Suture) ×6 IMPLANT
SUTURE VICRYL PLUS 3-0 27" SH VCP416 (Suture) ×8 IMPLANT
SWAB COTTON TIP STERILE 6 (Misc Medical Supply) IMPLANT
SYRINGE HYPO LL 3CC (Needles/punch/cannula/biopsy) ×4 IMPLANT
TAP DUAL LEAD 5MM (Drills/Bits/Burs/Taps/Reamers) ×4 IMPLANT
TOWELS OR BLUE 4-PACK STERILE, DISPOSABLE (Drapes/towels) ×12 IMPLANT
WAX BONE 2.5 GRAM HEMOSTATIC (Hemostatic agents/wax/sealants-absorbable) ×12 IMPLANT

## 2020-08-18 NOTE — Plan of Care (Signed)
Problem: Promotion of Perioperative Health and Safety  Goal: Promotion of Health and Safety of the Perioperative Patient  Description: The patient remains safe, receives treatment appropriate to the surgical intervention and patient's physiological needs and is discharged or transferred to the appropriate level of care.    Information below is the current care plan.  Outcome: Progressing  Flowsheets (Taken 08/18/2020 1605)  Guidelines: PACU  Individualized Interventions/Recommendations #1: anticipate needs

## 2020-08-18 NOTE — Plan of Care (Signed)
Problem: Promotion of Health and Safety  Goal: Promotion of Health and Safety  Description: The patient remains safe, receives appropriate treatment and achieves optimal outcomes (physically, psychosocially, and spiritually) within the limitations of the disease process by discharge.    Information below is the current care plan.  08/18/2020 2059 by Britt Boozer, RN  Outcome: Progressing  Flowsheets  Taken 08/18/2020 2058  Guidelines: Inpatient Nursing Guidelines  Individualized Interventions/Recommendations #1: address pain problems  Individualized Interventions/Recommendations #2 (if applicable): provide comfort  Individualized Interventions/Recommendations #3 (if applicable): encourage rest  Individualized Interventions/Recommendations #4 (if applicable): assist in turning  Taken 08/18/2020 1900  Patient /Family stated Goal: decrease pain  08/18/2020 2058 by Britt Boozer, RN  Outcome: Progressing  Flowsheets  Taken 08/18/2020 2058  Guidelines: Inpatient Nursing Guidelines  Individualized Interventions/Recommendations #1: address pain problems  Individualized Interventions/Recommendations #2 (if applicable): provide comfort  Individualized Interventions/Recommendations #3 (if applicable): encourage rest  Individualized Interventions/Recommendations #4 (if applicable): assist in turning  Taken 08/18/2020 1900  Patient /Family stated Goal: decrease pain

## 2020-08-18 NOTE — Brief Op Note (Signed)
BRIEF OPERATIVE NOTE    DATE: 08/18/2020  TIME: 10:19 AM    PREOPERATIVE DIAGNOSIS:   1. Degenerative spondylosis and stenosis with radiculopathy  2. S/p remote lumbar fusion with adjacent segment degeneration    POSTOPERATIVE DIAGNOSIS:   Same    PROCEDURE:   1. Anterior retroperitoneal approach per vascular surgery  2. Anterior lumbar interbody fusion L5-S1 with PEEK cage (Depuy synthes), formagraft, rhBMP-2  3. Placement of biomechanical vertebral structural device consisting of PEEK cage  4. Anterior spinal instrumentation L5-S1 with anterior plate and screws (Depuy Synthes)  5. Interpretation of intraoperative fluoroscopy    ATTENDING SURGEON: Lailoni Baquera  ASSISTANTS(s): choi, chun    ANESTHESIA: GETA    FINDINGS: ddd, spondylosis stenosis    WOUND CLASSIFICATION:Class I (clean)    WOUND CLOSURE STATUS:All layers of surgical incision (deep and superficial) were fully closed.    SPECIMENS: disc culture to micro    Fluids/Blood Products:      IV Fluids: 1L LR    Blood Products: None    EBL: 50cc    Urine Output: 709UK    COMPLICATIONS: None    DISPOSITION: Stable to stage 2

## 2020-08-18 NOTE — Plan of Care (Signed)
Problem: Promotion of Health and Safety  Goal: Promotion of Health and Safety  Description: The patient remains safe, receives appropriate treatment and achieves optimal outcomes (physically, psychosocially, and spiritually) within the limitations of the disease process by discharge.    Information below is the current care plan.  Outcome: Progressing  Flowsheets  Taken 08/18/2020 2058  Guidelines: Inpatient Nursing Guidelines  Individualized Interventions/Recommendations #1: address pain problems  Individualized Interventions/Recommendations #2 (if applicable): provide comfort  Individualized Interventions/Recommendations #3 (if applicable): encourage rest  Individualized Interventions/Recommendations #4 (if applicable): assist in turning  Taken 08/18/2020 1900  Patient /Family stated Goal: decrease pain

## 2020-08-18 NOTE — Brief Op Note (Signed)
BRIEF OPERATIVE NOTE    DATE: 08/18/2020  TIME: 11:05 AM    PREOPERATIVE DIAGNOSIS: Degenerative spondylosis and stenosis with radiculopathy    POSTOPERATIVE DIAGNOSIS: same    PROCEDURE INFORMATION:  Procedure(s):  Stage 1: Anterior lumbar interbody fusion with possible bone morphogenic protein, lumbar 5- sacral 1  Stage 2: Lumbar decompression and fusions with instrumentation, allograft versus autograft, lumbar 5-sacral 1  ANTERIOR SPINE EXPOSURE FOR ANTERIOR LUMBAR INTERBODY FUSION - Wound Class: Class I (Clean) - Incision Closure: Deep and Superficial Layers    ATTENDING SURGEON:   Surgeon(s) and Role:  Panel 1:     * Zlomislic, Vinko, MD - Primary     * Tommy Rainwater, MD - Fellow  Panel 2:     * Betsey Amen, MD - Primary     * Dumitru, Lyn Records, MD - Resident - Assisting     * Wellington Hampshire, MD    ANESTHESIA: General    FINDINGS: Anterior exposure done to the level of L5-S1. Extensive inflammation noted with scarring suggestive of active or former infection. See Orthopedic Surgery note for complete operative findings for the second panel.    SPECIMENS:   ID Type Source Tests Collected by Time Destination   1 : Lumber 5 - Sacral 1 Disc  Tissue Spine ANAEROBIC CULTURE, FUNGAL CULTURE, AFB CULTURE W/STAIN, TISSUE CULTURE W/GRAM STAIN, AEROBIC (Canceled), GENERAL AEROBIC CULTURE, OR ONLY Zlomislic, Vinko, MD 5/85/2778 1051    A : lumbar 5-sacral 1 disc Disc Spine PATHOLOGY TISSUE EXAM Zlomislic, Vinko, MD 2/42/3536 0940           Fluids/Blood Products:      IV Fluids: per anesthesia report    Blood Products: none    EBL: 41mL for vascular exposure portion    Urine Output: see anesthesia report     COMPLICATIONS: none    DISPOSITION: PACU

## 2020-08-18 NOTE — Plan of Care (Signed)
Problem: Promotion of Perioperative Health and Safety  Goal: Promotion of Health and Safety of the Perioperative Patient  Description: The patient remains safe, receives treatment appropriate to the surgical intervention and patient's physiological needs and is discharged or transferred to the appropriate level of care.    Information below is the current care plan.  Outcome: Progressing  Flowsheets  Taken 08/18/2020 2041  Guidelines: PACU  Individualized Interventions/Recommendations #1: assist in perioperative needs  Taken 08/18/2020 1900  Patient /Family stated Goal: decrease pain

## 2020-08-18 NOTE — H&P (Signed)
RAPID RECOVERY UNIT ADMISSION/PROGRESS NOTE    HPI: Stephen [name] is a 69 year old male with PMH HTN, degenerative spondylolithesis and lumbar radiculopathy now s/p Stage 1: ALIF L5-S1 ; Stage 2 revision laminectomy and PSIF L3 thru S1 by Dr Zlomislic on 2/68/34.    Op Course:   Vascular exposure findings: Anterior exposure done to the level of L5-S1. Extensive inflammation noted with scarring suggestive of active or former infection. See Orthopedic Surgery note for complete operative findings for the second panel.  Ortho spine findings: ddd, spondylosis stenosis    Fluids/Blood Products:    IV Fluids:2L crystalloid, 1L albumin     Blood Products: None    EBL: 350 cc    Urine Output: 800cc    Vitals:  Temperature:  [97 F (36.1 C)] 97 F (36.1 C) (08/31 0555)  Blood pressure (BP): (146)/(74) 146/74 (08/31 0555)  Heart Rate:  [64] 64 (08/31 0555)  Respirations:  [18] 18 (08/31 0555)  Pain Score: 10 (08/31 0636)  SpO2:  [97 %] 97 % (08/31 0555)   BP  Min: 146/74  Max: 146/74  Temp  Min: 97 F (36.1 C)  Max: 97 F (36.1 C)  Pulse  Min: 64  Max: 64  Resp  Min: 18  Max: 18  SpO2  Min: 97 %  Max: 97 %  Height  Min: 5' 7.87" (172.4 cm)  Max: 5' 7.87" (172.4 cm)  Weight  Min: 94.7 kg (208 lb 12.8 oz)  Max: 94.7 kg (208 lb 12.8 oz)      Physical Exam:  General: NAD, somnolent   Neuro/Mental Status: somnolent but arousable and responsive to voice  CV:RRR, no m/r/g  Pulmonary: CTAB  Abd: hypoactive bowel sounds, soft, nontender, nondistended, ALIF incision c/d/i  Extremities: warm, well perfused, 2+ pulses    Medications:  Scheduled Meds      Home Meds:  No current facility-administered medications on file prior to encounter.     Current Outpatient Medications on File Prior to Encounter   Medication Sig Dispense Refill    ALAWAY 0.025 % ophthalmic solution INSTILL 1 DROP INTO BOTH EYES TWICE A DAY AS DIRECTED      allopurinol (ZYLOPRIM) 300 MG tablet Take 300 mg by mouth daily.      amLODIPINE (NORVASC) 5 MG tablet  Take 10 mg by mouth daily.      Cetirizine HCl (ZYRTEC PO) 10 mg.       ciclopirox (LOPROX) 0.77 % GEL Apply topically 2 times daily.      omeprazole (PRILOSEC) 20 MG capsule Take 20 mg by mouth daily.      Polyethyl Glycol-Propyl Glycol (SYSTANE ULTRA OP)       triamcinolone (KENALOG) 0.1 % cream Apply 1 Application topically 2 times daily. Apply a thin layer as directed      VITAMIN D PO Take 500 mg by mouth daily.           Radiologic Data:  None     Assessment and Plan:    69 year old male with PMH HTN, degenerative spondylolithesis and lumbar radiculopathy now s/p Stage 1: ALIF L5-S1 ; Stage 2 revision laminectomy and PSIF L3 thru S1 by Dr Zlomislic on 1/96/22.    Neuro:  #Acute post operative pain:  - Multimodal pain control (tylenol scheduled, gabapentin, dilaudid)    #s/p ALIF L5-S1, PSIF L3-S1  -decadron 51m q6h     CV:   HDS    Pulm:  #post op hypoxemia  -  Vent settings/Supplemental O2: simple face mask, transition to NC then RA as tolerated     Renal/Fluid/Lytes:  No results for input(s): NA, K, CL, BICARB, BUN, CREAT, GLU, [place], MG, PHOS, IONCA in the last 72 hours.    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  No results for input(s): ALK, AST, ALT, TBILI, DBILI, ALB in the last 72 hours.      ID:  No results for input(s): WBC in the last 72 hours.   #periop ppx  -ancef 1g q8h    Heme:  No results for input(s): WBC, HGB, HCT, PLT, BAND, SEG in the last 72 hours.  No results for input(s): PT, PTT, INR in the last 72 hours.    Invalid input(s): FBG     #acute blood loss anemia   Hgb 11.3/Hct 34 on last intraop ABG, EBL 350cc  HDS  -monitor drain output   -CBC in am    Endocrine:  no h/o DM    Dispo: RRU for 24  hours    FASTHUGs BID / ABCDEF LIBERATION BUNDLE:  Feedings:yes   Analgesia:yes   Sedation:Not applicable    Thromboprophylaxis:yes   Head-of-bed elevation:Not applicable  Ulcer prophylaxis:Not applicable   Glycemic control:Not applicable   Spontaneous breathing trial: Not applicable    Bowel care: yes   Indwelling catheter removal: yes   De-escalation of antibiotics: yes    Note started by Melina Copa, NP; Completed by Dayton Bailiff, MD  ACCM/RRU  RRU pager x [phone]

## 2020-08-18 NOTE — Event / Update (Signed)
Pt in recovery, has been non verbal but following simple commands and shakes head yes/no but otherwise does not smile or open mouth/stick out tongue and does not track well with eyes. No unilateral deficit noted, MAE, wiggles toes and squeezes hands. Unsure if having difficulty coming out of anesthesia, MD Songolo with ACCM called to bedside for eval. Will continue to monitor, only given 52mcg fentanyl in PACU mode.

## 2020-08-18 NOTE — Anesthesia Postprocedure Evaluation (Signed)
Anesthesia Post Note    Patient: Stephen [name]    Procedure(s) Performed: Procedure(s):  Stage 1: Anterior lumbar interbody fusion, bone morphogenic protein, lumbar 5- sacral 1  Stage 2: Lumbar decompression and fusions with instrumentation, allograft versus autograft, lumbar 5-sacral 1  ANTERIOR SPINE EXPOSURE FOR ANTERIOR LUMBAR INTERBODY FUSION      Final anesthesia type: General    Patient location: PACU    Post anesthesia pain: adequate analgesia    Mental status: awake, alert  and oriented    Airway Patent: Yes    Last Vitals:   Vitals Value Taken Time   BP 122/72 08/18/20 1700   Temp 36.2 C 08/18/20 1621   Pulse 77 08/18/20 1709   Resp 16 08/18/20 1709   SpO2 93 % 08/18/20 1709   Vitals shown include unvalidated device data.     Post vital signs: stable    Hydration: adequate    N/V:no    Anesthetic complications: no    Plan of care per primary team.

## 2020-08-18 NOTE — Anesthesia Procedure Notes (Addendum)
Arterial Line Procedure Note  Procedure:  Date & Time: 08/18/2020 8:00 AM   Preparation: Patient was prepped and draped in usual sterile fashion  Indications:hemodynamic monitoring  Location: right radial Universal Protocol: risks and benefits discussed, patient states understanding of the procedure being performed, the patient's understanding of the procedure matches consent given, procedure consent matches procedure scheduled, relevant documents present and verified, test results available and properly labeled, site marked, imaging studies available, required blood products, implants, devices, and special equipment available and Immediately prior to procedure a time out was called to verify the correct patient, procedure, equipment, support staff and site/side marked as required  Consent given by: Consent given by: patient  Patient identity confirmed: Patient identity confirmed by: verbally with patient and arm band   Anesthesia: see MAR for details   Anesthetic total:  mL Sedation: patient sedated   Procedure Details: 20 and 1  Needle Gauge: 20  Number of insertion attempts 1  No Ultrasound Guidance Utilized    Medications Administered at:  08/18/2020 8:00 AM  Post Procedure: dressing applied and patient tolerated the procedure well with no immediate complications  dressing applied   Comments: I, Ardeen Fillers, MD, was present for the entire procedure.    Ardeen Fillers, MD  08/18/2020 8:00 AM

## 2020-08-18 NOTE — H&P (Addendum)
HISTORY & PHYSICAL - INTERVAL ASSESSMENT     Based on [place] Visit on 07/29/2020      **ONLY TO BE USED IN ADDITION TO A HISTORY & PHYSICAL**   Based on   Kharon Hixon  [id number]     This interval assessment is required for History & Physical completed less than 30 days prior to the admission or surgery. A History & Physical completed more than 30 days prior to the admission or surgery must be repeated.     Current Medical Status:  Unchanged     Medications / Allergies:  Unchanged     Review of Systems:  Denies fevers, chills, nausea, vomiting, shortness of breath     Physical Examination:  I have examined the patient today.  Unchanged     Laboratory or Clinical Data:  Unchanged    BP 146/74 (BP Location: Right arm, BP Patient Position: Sitting)    Pulse 64    Temp 97 F (36.1 C)    Resp 18    Ht 5' 7.87" (1.724 m)    Wt 94.7 kg (208 lb 12.8 oz)    SpO2 97%    BMI 31.87 kg/m      Modifications of Initial Care Plan:  Unchanged     Karan Ramnauth A. Moss Mc, M.D.  Orthopaedic Surgery  [facility] [place]    Cell: [phone]  Pager: [phone]

## 2020-08-18 NOTE — Brief Op Note (Signed)
BRIEF OPERATIVE NOTE    DATE: 08/18/2020  TIME: 3:35 PM    PREOPERATIVE DIAGNOSIS:   1. Degenerative spondylosis and stenosis with radiculopathy  2. S/p remote lumbar fusion with adjacent segment degeneration  3. S/p stage 1 ALIF L5-S1    POSTOPERATIVE DIAGNOSIS:   Same    PROCEDURE:   1. Removal of retained instrumentation L3 thru L5 consisting of screws and rods (Depuy Synthes)  2. Exploration of fusion L3 thru L5  3. Revision laminectomy L5, partial laminectomy S1 with bilateral facetectomies and foraminotomies L5-S1  3. Decompression of cauda equina and exploration of nerve roots bilateral S1, L5  4. Posterior segmental spinal instrumentation and fusion L3 thru S1 with screws and rods (Depuy Synthes), local bone autograft, DBM (accel), rhBMP-2, beta tricalcium phosphate (magnetos)  5. Interpretation of intraoperative fluoroscopy    ATTENDING SURGEON: Cataleia Gade  ASSISTANTS(s): Maxcine Ham    ANESTHESIA: GETA    FINDINGS: stenosis, spondylosis    WOUND CLASSIFICATION:Class I (clean)    WOUND CLOSURE STATUS:All layers of surgical incision (deep and superficial) were fully closed.    SPECIMENS: Retained screws to path for gross    Fluids/Blood Products:      IV Fluids: 2L LR, 1L albumin    Blood Products: None    EBL: 350cc    Urine Output: 276RW    COMPLICATIONS: Nonce    DISPOSITION: Stable to PACU

## 2020-08-18 NOTE — Op Note (Unsigned)
DATE OF SERVICE:  08/18/2020    PREOPERATIVE DIAGNOSIS:  Lumbar degenerative spine disease, level  L5-S1.     POSTOPERATIVE DIAGNOSIS:  Lumbar degenerative spine disease, level  L5-S1.     PROCEDURE PERFORMED:    1. Anterior exposure for lumbar interbody fusion levels L5-S1.  2. Supervision and interpretation of intraoperative fluoroscopy.  3. Placement of amniotic tissue 4 x 4 square cm, level L5-S1.    SURGEON/STAFF:  Betsey Amen, MD    CO-SURGEONIlla Level Zlomislic, MD    ANESTHESIA:  General.    ESTIMATED BLOOD LOSS:  40 cc.    COMPLICATIONS:  None.    DRAINS:  None.    SPECIMENS:  L5-S1 disk.    CULTURES:  L5-S1 disk culture.    INDICATION FOR PROCEDURE:  This is a 69 year old male with a history  of previous lumbar fusion.  He has a degenerating segment of the  L5-S1 below the previous fusion.  Anterior exposure was requested by  Dr. Lelon Huh and his team.  The patient was seen and counseled in  clinic prior to the procedure.  Risks and benefits were discussed  with the patient.  Risks including bleeding, infection, need for  further operation, damage to abdominal wall, abdominal wall hernia,  failure to heal, abdominal wall wound, damage to intraabdominal  structures including bowel, bladder, and ureter, injury to iliac  artery and vein, need for vascular repair, risk of deep venous  thrombosis, risk of retrograde ejaculation, risk of postoperative  fluid collection requiring drainage.  The patient understands these  risk and agrees to the procedure.     DESCRIPTION OF PROCEDURE:  The patient was brought to the operating  room.  He was placed under general anesthesia.  His abdominal wall  was sterilely prepped and draped.  Time-out procedure was performed.  A left paramedian incision was made between the umbilicus and pubis.  The soft tissue was divided with Bovie electrocautery.  The anterior  rectus sheath was opened along its course with Bovie electrocautery.  The rectus muscle was mobilized from medial  to lateral.  The  posterior rectus sheath was incised with Metzenbaum scissors.  The  peritoneum was swept from lateral to medial.  An Omni retractor was  placed.  It should be noted there was considerable inflammation  likely either due to the previous fusion and/or the possibility of  infection.  There was a heavy rind of tissue over the L5-S1 disk  space.  This was scored with Bovie electrocautery.  The middle sacral  artery and vein were surrounded with a right angle clamp.  This was  clipped both proximally and distally and divided with Metzenbaum  scissors.  The soft tissue was difficult to clear due to its heavy  adherence.  The posterior retroperitoneum was scored medial to the  right and left iliac veins to clear enough space.  A needle was  placed in midline position.  This was confirmed on AP and lateral  fluoroscopy.  Dr. Lelon Huh and his team then performed the disk  removal and fusion.  A specimen of the disk was sent both for  permanent section as well as culture.  Once the implant had been  successfully performed, Dr. Lelon Huh and his team was satisfied.  The retroperitoneum was inspected for bleeding and hemostasis was  achieved.  The disk level was then treated with vancomycin powder,  fibrillar topical hemostatic agent.  A 4 x 4 square cm piece of  AmnioBand was  then placed over the disk level.  The retractors were  removed.  A small rent in the peritoneum was closed with a single  figure-of-eight 3-0 Vicryl suture.  An AP radiograph was performed  which showed no evidence of retained foreign bodies.  The anterior  rectus sheath was then closed using running #1 PDS suture.  The soft  tissue was closed with 2-0 Vicryl subcutaneous suture and the skin  was closed with 4-0 Monocryl subcuticular suture.  Dermabond adhesive  and sterile dressings were placed.  The patient was then repositioned  for the posterior fusion.  Please see Dr. Zlomislic's dictated notes.   There were no complications for  this portion of the procedure.       Job #:  [id number]

## 2020-08-18 NOTE — Telephone Encounter (Signed)
Refills sent to provider. Do you want Ambien to PCP?

## 2020-08-18 NOTE — Plan of Care (Signed)
Problem: Promotion of Perioperative Health and Safety  Goal: Promotion of Health and Safety of the Perioperative Patient  Description: The patient remains safe, receives treatment appropriate to the surgical intervention and patient's physiological needs and is discharged or transferred to the appropriate level of care.    Information below is the current care plan.  08/18/2020 2057 by Britt Boozer, RN  Outcome: Resolved  08/18/2020 2041 by Britt Boozer, RN  Outcome: Progressing  Flowsheets  Taken 08/18/2020 2041  Guidelines: PACU  Individualized Interventions/Recommendations #1: assist in perioperative needs  Taken 08/18/2020 1900  Patient /Family stated Goal: decrease pain

## 2020-08-18 NOTE — Telephone Encounter (Signed)
I can do both for now.

## 2020-08-19 LAB — BASIC METABOLIC PANEL, BLOOD
Anion Gap: 13 mmol/L (ref 7–15)
BUN: 14 mg/dL (ref 8–23)
Bicarbonate: 25 mmol/L (ref 22–29)
Calcium: 8.7 mg/dL (ref 8.5–10.6)
Chloride: 106 mmol/L (ref 98–107)
Creatinine: 1.02 mg/dL (ref 0.67–1.17)
GFR: >60 mL/min
Glucose: 160 mg/dL — ABNORMAL HIGH (ref 70–99)
Potassium: 4.5 mmol/L (ref 3.5–5.1)
Sodium: 144 mmol/L (ref 136–145)

## 2020-08-19 LAB — HEMOGRAM, BLOOD
Hct: 34.8 % — ABNORMAL LOW (ref 40.0–50.0)
Hgb: 11.7 gm/dL — ABNORMAL LOW (ref 13.7–17.5)
MCH: 30.4 pg (ref 26.0–32.0)
MCHC: 33.6 g/dL (ref 32.0–36.0)
MCV: 90.4 um3 (ref 79.0–95.0)
MPV: 11 fL (ref 9.4–12.4)
Plt Count: 187 10*3/uL (ref 140–370)
RBC: 3.85 10*6/uL — ABNORMAL LOW (ref 4.60–6.10)
RDW: 14.7 % — ABNORMAL HIGH (ref 12.0–14.0)
WBC: 13.8 10*3/uL — ABNORMAL HIGH (ref 4.0–10.0)

## 2020-08-19 MED ORDER — MELATONIN 5 MG OR TABS
5.0000 mg | ORAL_TABLET | Freq: Every evening | ORAL | Status: DC
Start: 2020-08-19 — End: 2020-08-22
  Administered 2020-08-19 – 2020-08-21 (×4): 5 mg via ORAL
  Filled 2020-08-19 (×3): qty 1

## 2020-08-19 MED ORDER — PREGABALIN 75 MG OR CAPS
75.0000 mg | ORAL_CAPSULE | Freq: Two times a day (BID) | ORAL | Status: DC
Start: 2020-08-19 — End: 2020-08-22
  Administered 2020-08-19 – 2020-08-22 (×7): 75 mg via ORAL
  Filled 2020-08-19 (×7): qty 1

## 2020-08-19 MED ORDER — ALLOPURINOL 300 MG OR TABS
300.0000 mg | ORAL_TABLET | Freq: Every day | ORAL | Status: DC
Start: 2020-08-19 — End: 2020-08-22
  Administered 2020-08-19 – 2020-08-22 (×4): 300 mg via ORAL
  Filled 2020-08-19: qty 1
  Filled 2020-08-19: qty 3
  Filled 2020-08-19 (×2): qty 1

## 2020-08-19 MED ORDER — AMLODIPINE 5 MG OR TABS
5.0000 mg | ORAL_TABLET | Freq: Every day | ORAL | Status: DC
Start: 2020-08-19 — End: 2020-08-19

## 2020-08-19 MED ORDER — AMLODIPINE 5 MG OR TABS
5.0000 mg | ORAL_TABLET | Freq: Every evening | ORAL | Status: DC
Start: 2020-08-19 — End: 2020-08-22
  Administered 2020-08-19 – 2020-08-21 (×3): 5 mg via ORAL
  Filled 2020-08-19 (×3): qty 1

## 2020-08-19 MED ORDER — TIZANIDINE HCL 4 MG OR TABS
2.0000 mg | ORAL_TABLET | Freq: Three times a day (TID) | ORAL | Status: DC | PRN
Start: 2020-08-19 — End: 2020-08-22
  Administered 2020-08-19: 2 mg via ORAL
  Filled 2020-08-19: qty 1

## 2020-08-19 NOTE — Progress Notes (Signed)
Orthopedics Spine Progress Note  08/19/2020    Patient ID:  Name: Stephen [name]  MRN: [id number]  DOB: 1951-03-31    Procedures:  08/18/2020 L5-S1 ALIF, revision L3-S1 PSIF and decompression    Subjective:  Overall no issues overnight. Had some difficulty sleeping because of pillows not optimal. Denies cp/sob/f/c/n/v. No new numbness/tingling.    Objective:  Vital Signs:  BP (!) 126/95 (BP Location: Right arm, BP Patient Position: Semi-Fowlers)    Pulse 75    Temp 98.1 F (36.7 C)    Resp 24    Ht 5' 7.87" (1.724 m)    Wt 94.7 kg (208 lb 12.8 oz)    SpO2 100%    BMI 31.87 kg/m     Physical Exam:  General: patient awake, alert, and responding to commands; no apparent distress  Cardio: regular rate and rhythm per pulse  Respiratory: patient breathing comfortably without use of accessory muscles  Neck/Back/Abdomen: dressings in place, clean/dry/intact, Drain in place, output ss        Bilateral Lower Extremity   Motor:       Right           Left        Iliopsoas (L2/3)                     5/5           5/5         Quadriceps (L4)                    5/5           5/5        Tibialis Anterior (L4/5)           5/5           5/5        EHL (L5)                               5/5           5/5        GSC (S1)                              5/5           5/5   Sensation:        SILT in L2-S1 distributions.      Vascular Exam:        Warm and well perfused distally    Laboratory Data:   Lab Results   Component Value Date    WBC 13.8 (H) 08/19/2020    HGB 11.7 (L) 08/19/2020    HCT 34.8 (L) 08/19/2020    PLT 187 08/19/2020     Lab Results   Component Value Date    NA 144 08/19/2020    K 4.5 08/19/2020    CL 106 08/19/2020    BICARB 25 08/19/2020    BUN 14 08/19/2020    CREAT 1.02 08/19/2020    GLU 160 (H) 08/19/2020    [place] 8.7 08/19/2020     Lab Results   Component Value Date    INR 1.2 08/18/2020    PTT 28 08/18/2020         Input/Output:    Intake/Output Summary (Last 24 hours) at 08/19/2020 0911  Last data filed at 08/19/2020  0800  Gross per 24 hour   Intake 3354.17 ml   Output 4830 ml   Net -1475.83 ml       Current Medications:  Current Facility-Administered Medications   Medication    acetaminophen (TYLENOL) tablet 975 mg    allopurinol (ZYLOPRIM) tablet 300 mg    amLODIPINE (NORVASC) tablet 5 mg    bisacodyl (DULCOLAX) suppository 10 mg    ceFAZolin (ANCEF) 1,000 mg in sodium chloride 0.9 % 50 mL IVPB    dexAMETHasone (DECADRON) injection 4 mg    docusate sodium (COLACE) capsule 200 mg    HYDROmorphone (DILAUDID) injection 0.5 mg    HYDROmorphone (DILAUDID) injection 1 mg    lactated ringers infusion    melatonin tablet 5 mg    menthol (CEPACOL) lozenge 3 mg    multivitamin tablet 1 tablet    ondansetron (ZOFRAN) injection 4 mg    oxyCODONE (ROXICODONE) tablet 10 mg    oxyCODONE (ROXICODONE) tablet 5 mg    polyethylene glycol (MIRALAX) packet 17 g    pregabalin (LYRICA) capsule 75 mg    senna (SENOKOT) tablet 17.2 mg    tiZANidine (ZANAFLEX) tablet 2 mg       Assessment/Plan:  69 year old male 1 Day Post-Op s/p above procedures. Patient doing well and progressing appropriately.   - Antibiotics: ancef while drain in place  - acute post op anemia, asymptomatic  - DVT Prophylaxis: no pharmacologic prophylaxis; SCDs/TED hose, ambulation  - Pain Medication: multimodal  - Diet: per vascular  - PT/OT:OOB with TLSO brace  - Post op imaging: upright XR pending  - Drain: continue  - Foley: DC today  - Other: aggressive incentive spirometry    - Dispo: pending  pain control on oral pain medication, CM clearance, PT clearance (need for continued inpatient physical therapy evaluation to assess patient's mobility, in order to ensure a safe dispo plan)    Tommy Rainwater, MD  Orthopedic Spine Fellow      Please page the Orthopaedic Spine Surgery Team with questions or concerns based on patient location:     Established spine floor patients at [facility]: Ortho Spine 1 - [phone]      Established spine floor patients at [facility]:  Ortho Spine 2 - [phone]      New spine consultations not yet followed by spine team:    [facility] on call for "SPINE CONSULT" call schedule (Orthopaedic Spine versus Neurosurgery)   If Orthopaedic Spine is on call please page the PRIMARY CONSULT RESIDENT on call:   [place]: ORTHOPEDICS/TH   [place]: ORTHOPEDICS/HC

## 2020-08-19 NOTE — Progress Notes (Signed)
Vascular Surgery Progress Note    Patient Name: Stephen [name]  MRN: [id number]  Hospital Day:   1 day - Admitted on: 08/18/2020  Primary Vascular Complaint: anterior spine exposure    Past 24 hour events:   OR yesterday for spinal procedure: ALIF L5-S1 and then posterior instrumentation.    Subjective:  Patient complains of primarily posterior back pain today. Is surprised that he doesn't feel more abdominal discomfort.  Is hungry but denies flatus/bowel movements. No other acute complaints.     Objective  Last Vitals  Temperature:  [97.2 F (36.2 C)-98.3 F (36.8 C)] 97.9 F (36.6 C) (09/01 0400)  Blood pressure (BP): (111-151)/(58-106) 133/89 (09/01 0628)  Heart Rate:  [61-85] 63 (09/01 0628)  Respirations:  [9-21] 15 (09/01 0628)  Pain Score: 5 (09/01 0600)  O2 Device: None (Room air) (09/01 0628)  O2 Flow Rate (L/min):  [2 l/min-6 l/min] 2 l/min (09/01 0000)  SpO2:  [94 %-99 %] 96 % (09/01 0628)  Intake/Output                       08/18/20 0600 - 08/19/20 0559 08/19/20 0600 - 08/20/20 0559     [id number] 6213-0865 Total 0600-1759 7846-9629 Total                 Intake    P.O.  --  90 90  --  -- --    I.V.  3000  864.2 3864.2  50  -- 50    Total Intake 3000 954.2 3954.2 50 -- 50       Output    Urine  1125  2320 3445  100  -- 100    Drains  100  470 570  --  -- --    Blood  350  -- 350  --  -- --    Total Output 1575 2790 4365 100 -- 100       Net I/O     1425 -1835.8 -410.8 -50 -- -50          Physical Exam  General: NAD, A+Ox3  Cardiac: RRR  Pulm: Non-labored  Abdomen: Incision site CDI.  No erythema. No palpable collections. Soft. Non-distended  Extremities: Warm and well perfused, no significant edema    Labs:  Lab Results   Component Value Date    WBC 13.8 (H) 08/19/2020    RBC 3.85 (L) 08/19/2020    HGB 11.7 (L) 08/19/2020    HCT 34.8 (L) 08/19/2020    MCV 90.4 08/19/2020    MCHC 33.6 08/19/2020    RDW 14.7 (H) 08/19/2020    PLT 187 08/19/2020    MPV 11.0 08/19/2020     Lab Results   Component Value  Date    NA 144 08/19/2020    K 4.5 08/19/2020    CL 106 08/19/2020    BICARB 25 08/19/2020    BUN 14 08/19/2020    CREAT 1.02 08/19/2020    GLU 160 (H) 08/19/2020    [place] 8.7 08/19/2020    MG 2.0 03/03/2018     Lab Results   Component Value Date    INR 1.2 08/18/2020    PTT 28 08/18/2020       Microbiology:  No results found for: BLOODCULT  No results found for: CXBS      Assessment & Plan  Assessment:  69 year old male POD #1 s/p ALIF L5-S1 and posterior spinal instrumentation    Plan:  Advance diet to sips and chips today   OK for PO meds   Bowel regimen: colace [place], senna [place], miralax [place], bisacodyl PRN   Remainder of care per primary team    Myrtie Hawk, PA-C  Vascular Surgery  08/19/20  7:08 AM

## 2020-08-19 NOTE — Interdisciplinary (Signed)
Occupational Therapy Evaluation    Admitting Physician:  Zlomislic, Vinko, MD  Admission Date 08/18/2020    Inpatient Diagnosis:   Problem List       Codes    Pain     ICD-10-CM: R52  ICD-9-CM: 780.96    Relevant Orders    X-Ray Fluoroscopy Up To 1 Hr - OR (Completed)    Impaired functional mobility, balance, gait, and endurance     ICD-10-CM: Z74.09  ICD-9-CM: V49.89    Decreased activities of daily living (ADL)     ICD-10-CM: Z78.9  ICD-9-CM: V49.89          IP Start of Service  Start of Care: 08/19/20  Reason for referral: Decline in functional ability/mobility;Decline in performance of activities of daily living (ADL)    Preferred [place]         Past Medical History:   Diagnosis Date    Chronic suprapubic catheter (CMS-HCC)     Congenital hydronephrosis     Gout     Headache     Hematuria     Kidney disease     Kidney stones     Major depressive disorder, single episode     Polyarthropathy or polyarthritis of multiple sites     Retinal detachment     Urethral stricture       Past Surgical History:   Procedure Laterality Date    CT INSERTION OF SUPRAPUBIC CATH  09/25/2015    NEPHRECTOMY Right 1995    APPENDECTOMY      COLONOSCOPY      CYSTOSCOPY      CYSTOSCOPY W/ LASER LITHOTRIPSY      OTHER SURGICAL HISTORY      Interstim 01/29/2011    TRANSURETHRAL RESECTION OF PROSTATE         OT Acute     Row Name 08/19/20 1400          Type of Visit    Type of Occupational Therapy note  Occupational Therapy Evaluation     Row Name 08/19/20 1400          Treatment Time    Treatment Start Time  1250     Total TIMED Treatment (min)  45     Total Treatment Time (min)  60     Row Name 08/19/20 1400          Treatment Precautions/Restrictions    Precautions/Restrictions  Fall;Postsurgical/procedural;Spine     Fall  Socks/charm     Row Name 08/19/20 1400          Medical History    History of presenting condition  Kalijah Zeiss is a 69 year old male with PMH HTN, degenerative spondylolithesis and  lumbar radiculopathy now s/p Stage 1: ALIF L5-S1 ; Stage 2 revision laminectomy and PSIF L3 thru S1 by Dr Zlomislic on 05/24/29.     [place] Name 08/19/20 1400          Functional History    Prior Level of Function  No deficits     General ADL/Self-Care Assistance Needs  None- Independent with ADLs and self care     Other ADL/General Self-Care Information  Increased time for LB dressing     Equipment required for mobility in the home  Cane no AD usually     Row Name 08/19/20 1400          Social History    Living Situation  Lives with family sister, unable to provide assist  [place] accessibility  Accessible with wheelchair or walker;Performs activities of daily living (ADL's) on one level     Bathroom accessibility  [place] Name 08/19/20 1400          Subjective    Subjective information  Reporting some incontinence with urination     Patient status  Patient agreeable to treatment;Nursing in agreement for treatment;Patient pain control adequate to participate in therapy     [place] Name 08/19/20 1400          Pain Assessment    Pain Asssessment Tool  Numeric Pain Rating Scale     Row Name 08/19/20 1400          Numeric Pain Rating Scale    Pain Intensity - rating at present  5     Pain Intensity- rating after treatment  5     Location  spine incision     Row Name 08/19/20 1400          Activities of Daily Living (ADLs)    Upper Body Dressing  Maximum assistance (50-75% assistance)     Other Upper Body Dressing Information  Instructed in TLSO management prior to putting on, attempts to don incorrectly 3x before therapist assited in proper form, requires cues to redirect and attend to task      Lower Body Dressing  Minimum assistance (25% assistance)     Other Lower Body Dressing Information  to don underwear, able to demo [place] Name 08/19/20 1400          [place] AM-PAC: Daily Activity    Assistance Needed to Put on and Take off Regular Lower Body Clothing  3     Assistance  Needed to Bathe, Including Washing, Rinsing, and Drying  3     Assistance Needed to Toilet Environmental manager, Bedpan, or Urinal)  4     Assistance Needed to Put on and Take off Regular Upper Body Clothing  2     Assistance Needed to Take Care of Personal Grooming Such as Brushing Teeth  4     Assistance Needed to Eat Meals  4     AM-PAC Daily Activity Total Score  20     AMP-PAC Daily Activity Impairment rating  Score 20-22 - 20-39% impaired     Row Name 08/19/20 1400          Objective    Overall Cognitive Status  Intact - no cognitive limitations or impairments noted;At baseline level     Other  Cognitive Status Information  Hyperverbal, tangential in speech, requires cues to attend to task     Communication  No communication limitations or impairments noted. Current status of hearing, speech and vision allow functional communication.     Coordination/Motor control  No limitations or impairments noted. Movement patterns are fluid and coordinated throughout     Balance  Balance limitations present     Static Sitting Balance  Good - able to maintain balance without handhold support, limited postural sway     Dynamic Sitting Balance  Good - accepts moderate challenge, able to maintain balance while picking object off floor     Static Standing Balance  Fair - able to maintain balance with handhold support, may require occasional minimal assistance     Dynamic Standing Balance  Fair - accepts minimal challenge, able to maintain balance while turning head/trunk     Extremity Assessment  Range of motion, strength,  muscle tone and/or sensation limitations present     LUE findings  WFL     RUE findings  WFL     LLE findings  See PT eval     RLE findings  See PT eval     Functional Mobility  Functional mobility deficits present     Bed Mobility  Supervised     Bed Mobility Comments  Education and cuing for log roll and adherence to precautions     Transfers to/from Stand  Supervised     Transfer Comments  SBA     Ambulation during  functional tasks  Supervised SBA, cues for upright posture     Device used for ambulation/mobility  Front wheeled walker     Ambulation Distance  around RN unit to simulate home environement         OT Acute Tool Box     Row Name 08/19/20 1400          Cognition Assessment    Overall Cognitive Status  Intact - no cognitive limitations or impairments noted;At baseline level             Eval cont.     [place] Name 08/19/20 1400          Patient/Family Education    Learner(s)  Patient     Learner response to rehab patient education interventions  Verbalizes understanding;Needs reinforcement     Row Name 08/19/20 1400          Assessment    Assessment  Patient seen for OT EVAL, tolerates well. Educated on role of OT, OT POC, spine precautions and TLSO wearing protocol. Patient educated on compensatory tech for ADL management. Requires increased cuing and assist for TLSO management. FUnctioning below his baseline, req MAXA for UB dressing, MINA for LB dressing and SBA for functional mob using FWW. Pt limited by pain, dec attention, dec activity tolerance and dec upright balance. Will benefit from skilled IP OT to address deficits in order to maximize safety/IND with OOB ADLs/mobility. Pt safe to d/c home once medically cleared and IP OT goals met/TLSO brace goal met.      [place] Name 08/19/20 1400          Goal 1 (Short Term)    Impairment  Activities of Daily Living - Upper Body Dressing     Custom goal  Don/doff TLSO with SUP and no cues     Number of visits  3     Goal Status  New     Row Name 08/19/20 1400          Goal 2 (Short Term)    Impairment  Activities of Daily Living - Lower Body Dressing     Custom goal  SUP for LB dressing      Number of visits  3     Goal Status  [place] Name 08/19/20 1400          Treatment Plan    Continue therapy to address  Decline in functional ability/mobility;Decline in performance of activities of daily living (ADL)     Frequency of treatment  6 times per week     Duration of  treatment (number of visits)  Treatment will continue while in hospital and in need of skilled therapy services     Status of treatment  Patient evaluated and will benefit from ongoing skilled therapy     [place] Name 08/19/20 1400  Patient Safety Considerations    Patient safety considerations  Patient left sitting at end of treatment;Call light left in reach and fall precautions in place     Patient assistive device requirements for safe ambulation  [place] Name 08/19/20 1400          Post Acute Discharge Recommendations    Discharge Rehabilitation Reccomendations ([place])  None- patient currently  has no further skilled therapy needs     Equipment recommendations  [place] Name 08/19/20 1400          Therapy Plan Communication    Therapy Plan Communication  Discussed therapy plan and patient's mobility status with Case Manager;Discussed therapy plan with Nursing and/or Physician     Row Name 08/19/20 1400          Occupational Therapy Patient Discharge Instructions    Your Occupational Therapist suggests the following  Continue to follow your prescribed mobility precautions when transferring to the chair and toilet as instructed;Continue to complete your self care Activities of Daily Living as frequently as possible;Continue to use energy conservation, pursed lip breathing and self-pacing when completing your self care Activities of Daily Living;Supervision is suggested when you     Supervision is suggested when you  dress;bath     Row Name 08/19/20 1500          Type of Eval    Low Complexity [phone])  Completed     Row Name 08/19/20 1500          Therapeutic Procedures    Therapeutic Activities ([id number])  Assistance/facilitation of bed mobility;Dynamic activities to improve performance of functional tasks/activities;Facilitation of safety awareness/responses during functional tasks;Progressive mobilization to improve functional independence;Patient education;Transfer training with  weight shift and direction change         Total TIMED Treatment (min)  15     Self-Care/ADL Training [phone])  Activities of daily living training;Patient education;Personal hygiene;Safety procedures;Self-care activities of dally living;Grooming;Dressing;Compensatory training         Total TIMED Treatment (min)  30           The occupational therapist of record is endorsed by evaluating occupational therapist.

## 2020-08-19 NOTE — Interdisciplinary (Signed)
Physical Therapy Evaluation    Admitting Physician:  Zlomislic, Vinko, MD  Admission Date 08/18/2020    Inpatient Diagnosis:   Problem List       Codes    Pain     ICD-10-CM: R52  ICD-9-CM: 780.96    Relevant Orders    X-Ray Fluoroscopy Up To 1 Hr - OR (Completed)    Impaired functional mobility, balance, gait, and endurance     ICD-10-CM: Z74.09  ICD-9-CM: V49.89          IP Start of Service   Start of Care: 08/19/20  Onset Date: 08/18/2020  Reason for referral: Activity tolerance limitation;Range of motion/strength limitations;Decline in functional ability/mobility;Safety/judgement impairment    Preferred Language:English         Past Medical History:   Diagnosis Date   . Chronic suprapubic catheter (CMS-HCC)    . Congenital hydronephrosis    . Gout    . Headache    . Hematuria    . Kidney disease    . Kidney stones    . Major depressive disorder, single episode    . Polyarthropathy or polyarthritis of multiple sites    . Retinal detachment    . Urethral stricture       Past Surgical History:   Procedure Laterality Date   . CT INSERTION OF SUPRAPUBIC CATH  09/25/2015   . NEPHRECTOMY Right 1995   . APPENDECTOMY     . COLONOSCOPY     . CYSTOSCOPY     . CYSTOSCOPY W/ LASER LITHOTRIPSY     . OTHER SURGICAL HISTORY      Interstim 01/29/2011   . TRANSURETHRAL RESECTION OF PROSTATE         PT Acute     Row Name 08/19/20 1100          Type of Visit    Type of Physical Therapy note  Physical Therapy Evaluation     Row Name 08/19/20 1100          Treatment Precautions/Restrictions    Precautions/Restrictions  Fall;Multiple lines;Postsurgical/procedural;Spine     Fall  Socks/charm;Bed/chair alarm     Other Precautions/Restrictions Information  spinal precautions with TLSO for OOB mobility; telemetry, PIV, JP drain, foley catheter      Row Name 08/19/20 1100          Medical History    History of presenting condition  Pt "is a 69 year old male with PMH HTN, degenerative spondylolithesis and lumbar radiculopathy now s/p Stage 1:  ALIF L5-S1 ; Stage 2 revision laminectomy and PSIF L3 thru S1 by Dr Zlomislic on 3/53/61."      Fall history  No falls reported in the last 6 months     [place] Name 08/19/20 1100          Functional History    Prior Level of Function  No deficits     Equipment required for mobility in the home  None     Other Functional History Information  independent PTA, no DME use. voiced owning SPC within home- "hurry cane"     [place] Name 08/19/20 1100          Social History    Living Situation  Lives with parent/family     [place] accessibility   Accessible with wheelchair or walker     Other Social History Information  lives in a [facility] without steps to enter at sisters home, family able to assist  some as needed. was using bus for transportation prior to admission.      [place] Name 08/19/20 1100          Subjective    Subjective Information  pt pleasant, R sidelying in bed, agreeable to PT.      Patient status  Patient agreeable to treatment;Nursing in agreement for treatment;Patient pain control adequate to participate in therapy     Row Name 08/19/20 1100          Pain Assessment    Pain Asssessment Tool  Numeric Pain Rating Scale     Row Name 08/19/20 1100          Numeric Pain Rating Scale    Pain Intensity - rating at present  8     Pain Intensity- rating after treatment  8     Location  back at site of surgical incision, did not increase during mobility. RN aware and voiced premedicated pt prior to PT arrival.      [place] Name 08/19/20 1100          Objective    Overall Cognitive Status  Intact - no cognitive limitations or impairments noted     Other  Cognitive Status Information  oriented x3-4, able to follow all simple 1-2 step commands. hyperverbal requiring cues to redirect      Communication  No communication limitations or impairments noted. Current status of hearing, speech and vision allow functional communication.     Coordination/Motor control  No limitations or impairments noted. Movement  patterns are fluid and coordinated throughout     Balance  Balance limitations present     Static Sitting Balance  Good - able to maintain balance without handhold support, limited postural sway     Dynamic Sitting Balance  Fair - accepts minimal challenge, able to maintain balance while turning head/trunk     Static Standing Balance  Good - able to maintain balance without handhold support, limited postural sway     Dynamic Standing Balance  Fair - accepts minimal challenge, able to maintain balance while turning head/trunk     Other Balance Information  improved stability with FWW use vs no AD at this time due to increased postural sway and use of bed rail once in standing for stability without Ad use.      Extremity Assessment  Range of motion, strength,  muscle tone and/or sensation limitations present     LLE findings  hip flexion: 4/5, knee extension/flexion: 4/5, ankle DF/PF: WFL; ROM WFL     RLE findings  hip flexion: 4/5, knee extension/flexion: 4/5, ankle DF/PF: WFL; ROM WFL     Other  Extremity Assessment  Information  light touch sensation intact BLE     Functional Mobility  Functional mobility deficits present     Bed Mobility  Supervised/Modified independent     Bed Mobility Comments  cues for log roll technique with good return demo noted, increased time needed to perform. HOB slightly elevated + bed rail use - has grab bar/pole at home for assistance OOB.     Transfers to/from Stand  [facility]     Transfer Comments  cues for hand/foot placement and posture once in standing. performed with/without AD use but pt reaching for bedrail for additional stability without AD use. improved balance with FWW during transfers at this time.      Gait  - CGA     Gait Comments  gait with FWW use noted with decreased B  foot clearace, decreased B heel strike, decreased step length, decreased cadence and increased trunk flexion with tendency to progress FWW too far forward despite cues given for posture/correct FWW  positioning. pt also with tendency to ambulation outside FWW base of support when turning. no LOB noted, denied dizziness or increased pain reports.      Device used for [employer] used Comments  improved balance with FWW use      Ambulation Distance  150 ft x2 - standing rest break needed 2/2 fatigue.      Other Objective Findings  pt educated on spinal precautions as well as TLSO brace fit/wear/use - verbalized understanding but needs continued education. Pt required maxA to donn TLSO brace following adjustment of fit by PT. Hand over hand assistance needed for task performance and continued VC/TCS on use - needs continued education on fit/wear/use. Pt with minor dizziness reports initially in standing, VSS an did not increase during mobility.   Repositioned pt sitting bedside chair at end of session, VSS and RN aware.     BP sitting EOB: 117/78 (92) - asymptomatic  BP sitting x10 minutes: 106/67 (79) - asymptomatic  BP standing: 110/68 (80) - minor dizziness reported.  BP sitting post gait: 111/76 (87) - asymptomatic               Eval cont.     [place] Name 08/19/20 1100          [place] AM-PAC: Basic Mobility    Assistance Needed to Turn from Back to Side While in a Flat Bed Without Using Bedrails  4 - None (independent)     Difficulty with Supine to Sit Transfer  4 - None (independent)     How Much Help Needed to Move to/from Bed to Chair  3 - A little (supervised/min assist)     Difficulty with Sit to Stand Transfer from Chair with Arms  3 - A little (supervised/min assist)     How Much Help Needed to Walk in Room  3 - A little (supervised/min assist)     How Much Help Needed to Climb 3-5 Steps with a Rail  3 - A little (supervised/min assist)     AMPAC Total Score  20     Assessment: AM-PAC Basic Mobility Impairment Rating  Score 19-22 - 20-39% impaired     [place] Name 08/19/20 1100          Patient/Family Education    Learner(s)  Patient     Learner response to rehab  patient education interventions  Verbalizes understanding;Able to return demonstrate teaching;Needs reinforcement     Patient/family training comments  role of IPPT, safety, importance of mobility. spinal precautions/maintenance, TLSO brace fit/wear/use     [place] Name 08/19/20 1100          Assessment    Assessment  Pt currently able to perform bed mobility with S/mod indep, transfers with/without FWW and CGA. Pt able to ambulate 150 ft x2 with FWW and CGA - standing rest breaks as needed, max cues for posture/correct FWW positioning/use throughout session. All functional mobility currently limited due to decreased activity tolerance, decreased static/dyanamic standing balance, post op pain reports and post op spinal precautions. Pt would benefit from continued skilled IP PT to address current functional mobility deficits and increase independence with all functional mobility tasks. Once medically cleared for d/c and IP PT goals met, anticipate pt to return to home environment with family  assistance/S, FWW use and outpatient PT (requesting outaptient vs HHPT) - if available.     Recommend OOB to chair 3x/day for meals, ambulate with FWW and nursing assistance daily.        Rehab Potential  Good     Row Name 08/19/20 1100          Patient stated Goal    Patient stated goal  none stated      [place] Name 08/19/20 1100          Goal 1 (Short Term)    Impairment  Functional mobility limitation     Custom goal  pt will be able to perform all functional transfers with LRAD and S/mod indep consistently to allow for safe OOB mobilitly .     Number of visits  2-4     Goal Status  New     Row Name 08/19/20 1100          Goal 2 (Short Term)    Impairment  Gait impairment     Custom goal  pt will be able to perform ambulation of 250 ft with LRAD and S/mod indep to allow for safe household/community mobility upon return to home environment.      Number of visits  3-5     Goal Status  New     Row Name 08/19/20 1100          Goal 3  (Short Term)    Impairment  Education need     Custom goal  pt will be able to recall/maintain spianl precautions and be able to donn/doff TLSO brace mod indep to allow for safe OOB mobility prior to return to home environment.      Number of visits  4-6     Goal Status  New     Row Name 08/19/20 1100          Planned Therapy Interventions and Rationale    Gait Training  to normalize gait pattern and improve safety while ambulating with assistive device;to normalize gait pattern and improve safety while ambulating     Neuromuscular Re-Education  to improve safety during dynamic activities     Orthotic/Prosthetic Assessment and Training  to improve independence with donning/doffing device and skin care;to increase knowledge of proper wear and care of orthotic/splint     Therapeutic Activities  to improve functional mobility and ability to navigate in the home and/or community;to improve transfers between surfaces     Theraputic Exercise  to increase strength to allow greater independence with functional mobility skills;to improve activity tolerance to allow greater independence with functional mobility skills     [place] Name 08/19/20 1100          Treatment Plan Disussion    Treatment Plan Discussion and Agreement  Patient/family/caregiver stated understanding and agreement with the therapy plan;No family/caregiver available     [place] Name 08/19/20 1100          Treatment Plan    Continue therapy to address  Decline in functional ability/mobility;Activity tolerance limitation;Range of Motion/Strength limitations;Safety/judgement impairment     Frequency of treatment  7 times per week     Duration of treatment (number of visits)  While patient is hospitalized and in need of skilled therapy services     Status of treatment  Patient evaluated and will benefit from ongoing skilled therapy     Interdisciplinary Recommendations  Occupational Therapy consult     [place] Name 08/19/20 1100  Patient Safety Considerations     Patient safety considerations  Patient left sitting at end of treatment;Call light left in reach and fall precautions in place;Patient left  in appropriate pressure relieving position;Patient may be at risk for falls;Nursing notified of safety considerations at end of treatment VSS, asymptomatic     Patient assistive device requirements for safe ambulation  [place] Name 08/19/20 1100          Therapy Plan Communication    Therapy Plan Communication  Discussed therapy plan with Nursing and/or Physician;Encouraged out of bed with assistance by     Encouraged out of bed with assistance by  Nursing chair for meals, ambulate with FWW and RN assist      [place] Name 08/19/20 1100          Physical Therapy Patient Discharge Instructions    Your Physical Therapist suggests the following  Continue to complete your home exercise program daily as instructed;Continue to follow your prescribed mobility precautions when moving in and out of bed and walking  as instructed;Supervision with walking is suggested for increased safety;Continue to use your assistive device as instructed when walking to improve your stability and prevent falls     [place] Name 08/19/20 1100          Type of Eval    Low Complexity [phone])  Completed     Row Name 08/19/20 1100          Therapeutic Procedures    Gait Training [phone])  Assistive device training;Dynamic activities while walking;Gait pattern analysis and treatment of deviations;Patient education;Postural alighnment/biomechanic training during gait;Weight shift and postural control activities during gait        Total TIMED Treatment (min)   15     Therapeutic Activities (89381)   Assistance/facilitation of bed mobility;Dynamic activities to improve performance of  functional tasks/activities;Graded physical assist for functional activities;Functional activities;Patient education;Progressive mobilization to improve functional independence;Transfer training with weight shift and direction  change;Weight shift activities to improve safety in unsupported sitting or standing        Total TIMED Treatment (min)   15     Orthotic Mgmt/Training, initial encounter, each 15 minutes (01751)  Patient education;Safety precaution training;Skin care instruction;Wear time instruction -TLSO brace use/fit/wear        Total TIMED Treatment (min)   10     Row Name 08/19/20 1100          Treatment Time     Total TIMED Treatment  (min)  - 40     Total Treatment Time (min)  55     Treatment start time  0904         Post Acute Discharge Recommendations  Discharge Rehabilitation Reccomendations ([place]): If medically appropriate and available, patient demonstrates tolerance to participate in skilled therapy at the following anticipated level  Therapy level: Outpatient (-pt prefers outpatient vs HHPT. )  Equipment recommendations: To be determined as patient progresses in therapy;Loyal Buba Justification: Patient safety and mobility is enhanced by the use of a walker. Patient has indicated agreement to utilize the walker during Mobility Related Activities of Daily Living (MRADLs) and is able to complete MRADLs in a more timely manner using a walker.    The physical therapist of record is endorsed by evaluating physical therapist.

## 2020-08-19 NOTE — Interdisciplinary (Signed)
08/19/20 1128   Initial Assessment   CM Initial Assessment * Completed   Patient Information   Where was the patient admitted from? * Home   Prior to Level of Function * Ambulatory/Independent with ADL's   Assistive Device * Cane;BS commode   Primary Caretaker(s) * Family   Primary Contact Name, Number and Relationship * [name], Sister, [phone]   Permission to Contact * Not Applicable   Discharge Planning   Living Arrangements * Family Member   Available Assistance/Support System * None  (Lives with sister and brother-in-law , however, pt cannot depend on their support)   Type of Residence * [place] * No   Anticipated Discharge Dispostion/Needs Home;Home with Family   Patient's Discharge Goal(s) Home;Other (Comment)  (Pt wants outpatient instead of HH)   Barriers to Discharge * None   Patient/Family/Other Engaged in Discharge Planning * Yes   Name, Relationship and Phone Number of Person Engaged in the Discharge Plan self   Patient Has Decision Making Capacity * Yes   Patient/Family/Legal/Surrogate Decision Maker Has Been Given a List Options And Choice In The Selection of Post-Acute Care Providers * Not Applicable  (Pt declined HH at this time. Pt prefers outpatient)   Family/Caregiver's Assessed for * Readiness, willingness, and ability to provide or support self-management activities;Readiness to provide care to the patient   Respite Care * Not Applicable   Patient/Family/Other Are In Agreement With Discharge Plan * To be determined   Public Health Clearance Needed * Not Applicable   Social Worker Consult   Do you need to see a Education officer, museum? * Yes  (Resources for other housing  housing options)   Readmission Risk Assessment   Readmission Within 30 Days of Discharge * No   Recent Hospitalizations (Within Last 6 Months) * No   High Risk For Readmission * No       Medical Necessity:  POD #1  PROCEDURE INFORMATION:  Procedure(s):  Stage 1: Anterior lumbar interbody fusion  with possible bone morphogenic protein, lumbar 5- sacral 1  Stage 2: Lumbar decompression and fusions with instrumentation, allograft versus autograft, lumbar 5-sacral 1  ANTERIOR SPINE EXPOSURE FOR ANTERIOR LUMBAR INTERBODY FUSION - Wound Class: Class I (Clean) - Incision Closure: Deep and Superficial Layers       LOS at time of Initial Assessment: 1 Day 6 Hours  Pt admitted on 08/18/2020  5:05 AM  LACE+ Score: 26    PCP verified:  [name]  [address] / [place] [address]  Phone: [phone]  Fax: [phone]  Pharmacy:  [id number] [place] - [address]    PLOF: Independent  Hx of SNF placement: none  [facility]  DME:  cane, commode       DISCHARGE PLANNING  Case Manager met with patient for initial DC planning.  Confirmed phone number and address on face sheet.  Patient will return to address in face sheet  Support system: None. Pt lives with sister and brother-in-law, however, he cannot depend on their support.  Pt's [employer] Parcelprovides transportation to his appts.  Anticipated DC disposition (home, SNF, etc) :  Pending PT/OT recommendation. Pt will Return to his home address upon DC ( sister's home ). Address on FS.  Anticipated DC needs (HH, DME, none, etc):  Pending PT/OT recommendation. Pt declined HH at this time. He wants Outpt instead.  SW  consulted to see pt for housing  resources per his request    Anticipated barriers to discharge: none  Transportation:  Environmental health practitioner      Expected discharge date:  2-3 days      Case Manager will continue to assess needs for safe transition to home or next level of care.

## 2020-08-19 NOTE — Plan of Care (Signed)
Problem: Promotion of Health and Safety  Goal: Promotion of Health and Safety  Description: The patient remains safe, receives appropriate treatment and achieves optimal outcomes (physically, psychosocially, and spiritually) within the limitations of the disease process by discharge.    Information below is the current care plan.  08/19/2020 0908 by Leanord Hawking, RN  Flowsheets  Taken 08/19/2020 0800 by Leanord Hawking, RN  Patient /Family stated Goal: Pain control  Taken 08/18/2020 2058 by Britt Boozer, RN  Guidelines: Inpatient Nursing Guidelines  Individualized Interventions/Recommendations #2 (if applicable): provide comfort  Individualized Interventions/Recommendations #3 (if applicable): encourage rest  08/19/2020 0907 by Leanord Hawking, RN  Outcome: Progressing

## 2020-08-19 NOTE — Progress Notes (Signed)
RAPID RECOVERY UNIT ADMISSION/PROGRESS NOTE    HPI: Stephen [name] is a 69 year old male with PMH HTN, degenerative spondylolithesis and lumbar radiculopathy now s/p Stage 1: ALIF L5-S1 ; Stage 2 revision laminectomy and PSIF L3 thru S1 by Dr Zlomislic on 1/61/09.    Op Course:   Vascular exposure findings: Anterior exposure done to the level of L5-S1. Extensive inflammation noted with scarring suggestive of active or former infection. See Orthopedic Surgery note for complete operative findings for the second panel.  Ortho spine findings: ddd, spondylosis stenosis    Fluids/Blood Products:    IV Fluids:2L crystalloid, 1L albumin     Blood Products: None    EBL: 350 cc    Urine Output: 800cc    24 hour events:  -noted high JP output drainage--f/u H/H stable and remained HDS      Vitals:  Temperature:  [97.2 F (36.2 C)-98.3 F (36.8 C)] 98.1 F (36.7 C) (09/01 0800)  Blood pressure (BP): (106-151)/(58-106) 106/67 (09/01 0930)  Heart Rate:  [61-85] 82 (09/01 0930)  Respirations:  [9-24] 14 (09/01 0930)  Pain Score: Patient Sleeping, Respiratory Assessment Done (09/01 0911)  O2 Device: None (Room air) (09/01 0930)  O2 Flow Rate (L/min):  [2 l/min-6 l/min] 2 l/min (09/01 0000)  SpO2:  [94 %-100 %] 97 % (09/01 0930)   BP  Min: 106/67  Max: 151/99  Temp  Min: 97.2 F (36.2 C)  Max: 98.3 F (36.8 C)  Pulse  Min: 61  Max: 85  Resp  Min: 9  Max: 24  SpO2  Min: 94 %  Max: 100 %      Physical Exam:  General: NAD, lying on side  Neuro/Mental Status: MAE, no focal deficits  CV:RRR  Pulmonary: CTAB  Abd: hypoactive bowel sounds, soft, nontender, nondistended, ALIF incision c/d/i  Extremities: warm, well perfused, 2+ pulses    Medications:  Scheduled Meds   acetaminophen  975 mg Q8H    allopurinol  300 mg Daily    amLODIPINE  5 mg HS    ceFAZolin (ANCEF) IV  1,000 mg Q8H    dexAMETHasone  4 mg Q6H    docusate sodium  200 mg HS    melatonin  5 mg HS    multivitamin  1 tablet Daily    polyethylene glycol  17 g  Daily    pregabalin  75 mg BID    senna  2 tablet QAM       Home Meds:  No current facility-administered medications on file prior to encounter.     Current Outpatient Medications on File Prior to Encounter   Medication Sig Dispense Refill    ALAWAY 0.025 % ophthalmic solution INSTILL 1 DROP INTO BOTH EYES TWICE A DAY AS DIRECTED      allopurinol (ZYLOPRIM) 300 MG tablet Take 300 mg by mouth daily.      amLODIPINE (NORVASC) 5 MG tablet Take 10 mg by mouth daily.      Cetirizine HCl (ZYRTEC PO) 10 mg.       ciclopirox (LOPROX) 0.77 % GEL Apply topically 2 times daily.      omeprazole (PRILOSEC) 20 MG capsule Take 20 mg by mouth daily.      Polyethyl Glycol-Propyl Glycol (SYSTANE ULTRA OP)       triamcinolone (KENALOG) 0.1 % cream Apply 1 Application topically 2 times daily. Apply a thin layer as directed      VITAMIN D PO Take 500 mg by mouth daily.  Radiologic Data:  None     Assessment and Plan:    69 year old male with PMH HTN, degenerative spondylolithesis and lumbar radiculopathy now s/p Stage 1: ALIF L5-S1 ; Stage 2 revision laminectomy and PSIF L3 thru S1 by Dr Zlomislic on 8/78/67.    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  #urethral strictures  -making adequate u/o, ~100/hr  -gap 13/ bicarb 25  -creat stable  -dc foley after PT  -LR at 50 cc/hr given limited diet    GI: Diet Sips/Ice Chips  Recent Labs     08/18/20  2215   ALK 64   AST 27   ALT 9   TBILI 1.00   ALB 4.7      -vascular recommending sips chips for diet  -senna/colace    ID:  Recent Labs     08/18/20  2215 08/19/20  0550   WBC 12.2* 13.8*      #periop ppx  -ancef 1g q8h  -wbc 12.2  -tmax 98.7    Heme:  Recent Labs     08/18/20  2215 08/19/20  0550   WBC 12.2* 13.8*   HGB 12.0* 11.7*   HCT 36.5* 34.8*   PLT 178 187   SEG 89  --      Recent Labs     08/18/20  2215   PT 12.6*   PTT 28   INR 1.2        #acute blood loss anemia   Hgb 11.3/Hct 34 on last intraop ABG, EBL 350cc  HDS  -h/h now 11/34.8--stable    #high JP output  -JP serosang--orthospine team aware of high jp output  -cont IV fluids   -H/H stable    Endocrine:  no h/o DM    Dispo: tx to med surg    FASTHUGs BID / ABCDEF LIBERATION BUNDLE:  Feedings:yes   Analgesia:yes   Sedation:Not applicable    Thromboprophylaxis:needs to be addreessed   Head-of-bed elevation:Not applicable  Ulcer prophylaxis:Not applicable   Glycemic control:Not applicable   Spontaneous breathing trial: Not applicable    Bowel care: yes   Indwelling catheter removal: yes   De-escalation of antibiotics: yes    KMelina Copa NP  ACCM/RRU  RRU pager x [phone]

## 2020-08-19 NOTE — Interdisciplinary (Signed)
Procedure: [place]  [phone]) w/ Anterior Thoracic Extension [phone])  applied      Location: Spine  Instruction/Education Provided: yes    Ordering Physician: Zlomislic, Vinko MD    Breg Product: No     Brace was issued by [facility] [place] staff    Brace was fit and applied by PT staff

## 2020-08-20 ENCOUNTER — Inpatient Hospital Stay (HOSPITAL_BASED_OUTPATIENT_CLINIC_OR_DEPARTMENT_OTHER): Payer: Medicare Other

## 2020-08-20 DIAGNOSIS — Z981 Arthrodesis status: Secondary | ICD-10-CM

## 2020-08-20 LAB — PREPARE/CROSSMATCH PRBCS
Barcoded ABO/RH: 6200
Expiration: 202109012359
Type: A POS

## 2020-08-20 MED ORDER — OXYBUTYNIN CHLORIDE 5 MG OR TABS
2.5000 mg | ORAL_TABLET | Freq: Three times a day (TID) | ORAL | Status: AC
Start: 2020-08-20 — End: 2020-08-22
  Administered 2020-08-20 – 2020-08-22 (×6): 2.5 mg via ORAL
  Filled 2020-08-20 (×6): qty 1

## 2020-08-20 NOTE — Interdisciplinary (Signed)
Physical Therapy Discharge Summary    Admitting Physician:  Zlomislic, Vinko, MD  Admission Date 08/18/2020    Inpatient Diagnosis:   Problem List       Codes    Pain     ICD-10-CM: R52  ICD-9-CM: 780.96    Relevant Orders    X-Ray Fluoroscopy Up To 1 Hr - OR (Completed)    Impaired functional mobility, balance, gait, and endurance     ICD-10-CM: Z74.09  ICD-9-CM: V49.89    Decreased activities of daily living (ADL)     ICD-10-CM: Z78.9  ICD-9-CM: V49.89          IP Start of Service   Start of Care: 08/19/20  Onset Date: 08/18/2020  Reason for referral: Activity tolerance limitation;Range of motion/strength limitations;Decline in functional ability/mobility;Safety/judgement impairment    Preferred Language:English         Past Medical History:   Diagnosis Date   . Chronic suprapubic catheter (CMS-HCC)    . Congenital hydronephrosis    . Gout    . Headache    . Hematuria    . Kidney disease    . Kidney stones    . Major depressive disorder, single episode    . Polyarthropathy or polyarthritis of multiple sites    . Retinal detachment    . Urethral stricture       Past Surgical History:   Procedure Laterality Date   . CT INSERTION OF SUPRAPUBIC CATH  09/25/2015   . NEPHRECTOMY Right 1995   . APPENDECTOMY     . COLONOSCOPY     . CYSTOSCOPY     . CYSTOSCOPY W/ LASER LITHOTRIPSY     . OTHER SURGICAL HISTORY      Interstim 01/29/2011   . TRANSURETHRAL RESECTION OF PROSTATE         PT Acute     Row Name 08/20/20 0900          Type of Visit    Type of Physical Therapy note  Physical Therapy Discharge Summary     Row Name 08/20/20 0900          Medical History    History of presenting condition  Pt "is a 69 year old male with PMH HTN, degenerative spondylolithesis and lumbar radiculopathy now s/p Stage 1: ALIF L5-S1 ; Stage 2 revision laminectomy and PSIF L3 thru S1 by Dr Zlomislic on 0/25/42."      Fall history  No falls reported in the last 6 months     [place] Name 08/20/20 0900          Subjective    Subjective Information   Patient perseverating on his urination this morning. He reports he dribbled in the room and bathroom and had to change his socks, underwear and gown.      Patient status  Patient agreeable to treatment;Nursing in agreement for treatment;Patient pain control adequate to participate in therapy     Row Name 08/20/20 0900          Pain Assessment    Pain Asssessment Tool  Numeric Pain Rating Scale     Row Name 08/20/20 0900          Numeric Pain Rating Scale    Pain Intensity - rating at present  6     Pain Intensity- rating after treatment  6     Location  back incision     Row Name 08/20/20 0900          Objective  Overall Cognitive Status  Intact - no cognitive limitations or impairments noted     Communication  No communication limitations or impairments noted. Current status of hearing, speech and vision allow functional communication.     Other  Communication Information  hyperverbal     Coordination/Motor control  No limitations or impairments noted. Movement patterns are fluid and coordinated throughout     Balance  Balance limitations present     Static Sitting Balance  Good - able to maintain balance without handhold support, limited postural sway     Dynamic Sitting Balance  Good - accepts moderate challenge, able to maintain balance while picking object off floor     Static Standing Balance  Good - able to maintain balance without handhold support, limited postural sway     Dynamic Standing Balance  Fair - accepts minimal challenge, able to maintain balance while turning head/trunk     Other Balance Information  with SPC in standing     Bed Mobility  Modified independent     Bed Mobility Comments  Patient is able to perform supine>sit with log roll and + bed features     Transfers to/from Stand  Modified independent     Transfer Comments  Patient is able to perform sit to stand from bed with [facility]     Gait  Modified independent     Gait Comments  Patient is able to ambulate 1000' with SPC and no LOB. Patient  was able to demonstrate step through gait pattern and B heel strike. Pt uses SPC for support for improved balance. Patient demonstrated upright posture with forward gaze. Pt had no difficulty with turns or obstacle navigation     Device used for ambulation/mobility  Cane     Ambulation Distance  1000'     Other Objective Findings  Patient educated on importance of log roll and to abide by spinal precautions. Pt was able to don TLSO with no difficulty and recall 3/3 spinal precautions. Patient left sitting in chair with all needs met.                Eval cont.     [place] Name 08/20/20 0900          [place] AM-PAC: Basic Mobility    Assistance Needed to Turn from Back to Side While in a Flat Bed Without Using Bedrails  4 - None (independent)     Difficulty with Supine to Sit Transfer  4 - None (independent)     How Much Help Needed to Move to/from Bed to Chair  4 - None (independent)     Difficulty with Sit to Stand Transfer from Chair with Arms  4 - None (independent)     How Much Help Needed to Walk in Room  4 - None (independent)     How Much Help Needed to Climb 3-5 Steps with a Rail  4 - None (independent)     AMPAC Total Score  24     Assessment: AM-PAC Basic Mobility Impairment Rating  Score 24 - 0% impaired     Row Name 08/20/20 0900          Patient/Family Education    Learner(s)  Patient     Learner response to rehab patient education interventions  Verbalizes understanding;Able to return demonstrate teaching;Needs reinforcement     Patient/family training comments  role of IPPT, safety, importance of mobility. spinal precautions/maintenance, TLSO brace fit/wear/use     Row Name 08/20/20 0900  Assessment    Assessment  Patient seen for skilled IP PT treatment session. Patient is able to ambulate 1000' with mod I and SPC. Patient was able to don TLSO and recall spinal precautions. Pt has met all goals and has no further IP PT needs. Pt to DC to nursing care for remainder of admission. Once pt is medically  stable recommend pt DC with HH PT and no DME     Rehab Potential  Good     Row Name 08/20/20 0900          Patient stated Goal    Patient stated goal  none stated      [place] Name 08/20/20 0900          Goal 1 (Short Term)    Custom goal  pt will be able to perform all functional transfers with LRAD and S/mod indep consistently to allow for safe OOB mobilitly .     Goal Status  Met     Row Name 08/20/20 0900          Goal 2 (Short Term)    Custom goal  pt will be able to perform ambulation of 250 ft with LRAD and S/mod indep to allow for safe household/community mobility upon return to home environment.      Goal Status  Met     Row Name 08/20/20 0900          Goal 3 (Short Term)    Custom goal  pt will be able to recall/maintain spianl precautions and be able to donn/doff TLSO brace mod indep to allow for safe OOB mobility prior to return to home environment.      Goal Status  Met     Row Name 08/20/20 0900          Planned Therapy Interventions and Rationale    Gait Training  to normalize gait pattern and improve safety while ambulating with assistive device;to normalize gait pattern and improve safety while ambulating     Neuromuscular Re-Education  to improve safety during dynamic activities     Orthotic/Prosthetic Assessment and Training  to improve independence with donning/doffing device and skin care;to increase knowledge of proper wear and care of orthotic/splint     Therapeutic Activities  to improve functional mobility and ability to navigate in the home and/or community;to improve transfers between surfaces     Theraputic Exercise  to increase strength to allow greater independence with functional mobility skills;to improve activity tolerance to allow greater independence with functional mobility skills     Row Name 08/20/20 0900          Treatment Plan Disussion    Treatment Plan Discussion and Agreement  Patient/family/caregiver stated understanding and agreement with the therapy plan;No family/caregiver  available     Row Name 08/20/20 0900          Treatment Plan    Frequency of treatment  Patient appropriate for discharge from therapy     Status of treatment  Patient appropriate for discharge from therapy     [employer] Name 08/20/20 0900          Patient Safety Considerations    Patient safety considerations  Patient left sitting at end of treatment;Call light left in reach and fall precautions in place;Patient left  in appropriate pressure relieving position;Patient may be at risk for falls;Nursing notified of safety considerations at end of treatment     Patient assistive device requirements for safe ambulation  Walker;[employer]  [place] Name 08/20/20 0900          Therapy Plan Communication    Therapy Plan Communication  Discussed therapy plan with Nursing and/or Physician;Encouraged out of bed with assistance by     Encouraged out of bed with assistance by  Nursing     Row Name 08/20/20 0900          Physical Therapy Patient Discharge Instructions    Your Physical Therapist suggests the following  Continue to complete your home exercise program daily as instructed;Continue to follow your prescribed mobility precautions when moving in and out of bed and walking  as instructed;Supervision with walking is suggested for increased safety;Continue to use your assistive device as instructed when walking to improve your stability and prevent falls     Row Name 08/20/20 0900          Therapeutic Procedures    Gait Training [phone])  Assistive device training;Dynamic activities while walking;Gait pattern analysis and treatment of deviations;Patient education;Postural alighnment/biomechanic training during gait;Weight shift and postural control activities during gait        Total TIMED Treatment (min)   30     Therapeutic Activities (35009)   Assistance/facilitation of bed mobility;Dynamic activities to improve performance of  functional tasks/activities;Graded physical assist for functional activities;Functional activities;Patient  education;Progressive mobilization to improve functional independence;Transfer training with weight shift and direction change;Weight shift activities to improve safety in unsupported sitting or standing        Total TIMED Treatment (min)   15     Row Name 08/20/20 0900          Treatment Time     Total TIMED Treatment  (min)  45     Total Treatment Time (min)  45     Treatment start time  0915         Post Acute Discharge Recommendations  Discharge Rehabilitation Reccomendations ([place]): If medically appropriate and available, patient demonstrates tolerance to participate in skilled therapy at the following anticipated level  Therapy level: Home health  Equipment recommendations: To be determined as patient progresses in therapy;Loyal [place] Justification: Patient safety and mobility is enhanced by the use of a walker. Patient has indicated agreement to utilize the walker during Mobility Related Activities of Daily Living (MRADLs) and is able to complete MRADLs in a more timely manner using a walker.    The physical therapist of record is endorsed by evaluating physical therapist.

## 2020-08-20 NOTE — Interdisciplinary (Signed)
Occupational Therapy Discharge Summary    Admitting Physician:  Zlomislic, Vinko, MD  Admission Date 08/18/2020    Inpatient Diagnosis:   Problem List       Codes    Pain     ICD-10-CM: R52  ICD-9-CM: 780.96    Relevant Orders    X-Ray Fluoroscopy Up To 1 Hr - OR (Completed)    Impaired functional mobility, balance, gait, and endurance     ICD-10-CM: Z74.09  ICD-9-CM: V49.89    Decreased activities of daily living (ADL)     ICD-10-CM: Z78.9  ICD-9-CM: V49.89        IP Start of Service  Start of Care: 08/19/20  Reason for referral: Decline in functional ability/mobility;Decline in performance of activities of daily living (ADL)    Preferred [place]    Past Medical History:   Diagnosis Date   . Chronic suprapubic catheter (CMS-HCC)    . Congenital hydronephrosis    . Gout    . Headache    . Hematuria    . Kidney disease    . Kidney stones    . Major depressive disorder, single episode    . Polyarthropathy or polyarthritis of multiple sites    . Retinal detachment    . Urethral stricture       Past Surgical History:   Procedure Laterality Date   . CT INSERTION OF SUPRAPUBIC CATH  09/25/2015   . NEPHRECTOMY Right 1995   . APPENDECTOMY     . COLONOSCOPY     . CYSTOSCOPY     . CYSTOSCOPY W/ LASER LITHOTRIPSY     . OTHER SURGICAL HISTORY      Interstim 01/29/2011   . TRANSURETHRAL RESECTION OF PROSTATE         OT Acute     Row Name 08/20/20 1300          Type of Visit    Type of Occupational Therapy note  Occupational Therapy Discharge Summary     Row Name 08/20/20 1300          Treatment Time    Treatment Start Time  1000     Total TIMED Treatment (min)  45     Total Treatment Time (min)  45     Row Name 08/20/20 1300          Treatment Precautions/Restrictions    Precautions/Restrictions  Fall;Postsurgical/procedural;Spine     Fall  Socks/charm     Row Name 08/20/20 1300          Medical History    History of presenting condition  Stephen [name] is a 69 year old male with PMH HTN, degenerative spondylolithesis  and lumbar radiculopathy now s/p Stage 1: ALIF L5-S1 ; Stage 2 revision laminectomy and PSIF L3 thru S1 by Dr Zlomislic on 04/10/52.     [place] Name 08/20/20 1300          Subjective    Subjective information  Pt reporting concern with urinary incontinence, RN and NP aware.      Patient status  Patient agreeable to treatment;Nursing in agreement for treatment;Patient pain control adequate to participate in therapy     Row Name 08/20/20 1300          Pain Assessment    Pain Asssessment Tool  Numeric Pain Rating Scale     Row Name 08/20/20 1300          Numeric Pain Rating Scale    Pain Intensity - rating at present  4     Pain Intensity- rating after treatment  4     Location  spine incision     Row Name 08/20/20 1300          Activities of Daily Living (ADLs)    Self Feeding  Independent     Self Grooming  Independent     Other Self Grooming Information  standing at sink, completing oral care and washing face.      Upper Body Dressing  Independent     Other Upper Body Dressing Information  demos good understanding of brace management      Lower Body Dressing  Independent     Other Lower Body Dressing Information  Pt demos partial figure 4 and functional reach for LB dressing within precautions.      Toileting  Independent     Other Toileting Information  standing at toilet, pericare     Toilet Transfers  Modified independent     Other Toilet Transfers Information  using R grab bar     Row Name 08/20/20 1300          [place] AM-PAC: Daily Activity    Assistance Needed to Put on and Take off Regular Lower Body Clothing  4     Assistance Needed to Bathe, Including Washing, Rinsing, and Drying  3     Assistance Needed to Toilet [employer], Bedpan, or Urinal)  4     Assistance Needed to Put on and Take off Regular Upper Body Clothing  4     Assistance Needed to Take Care of Personal Grooming Such as Brushing Teeth  4     Assistance Needed to Eat Meals  4     AM-PAC Daily Activity Total Score  23     AMP-PAC Daily Activity  Impairment rating  Score 24 - 0% impaired     Row Name 08/20/20 1300          Objective    Overall Cognitive Status  Intact - no cognitive limitations or impairments noted;At baseline level     Other  Cognitive Status Information  Hyperverbal, tangential in speech, requires cues to attend to task     Communication  No communication limitations or impairments noted. Current status of hearing, speech and vision allow functional communication.     Coordination/Motor control  No limitations or impairments noted. Movement patterns are fluid and coordinated throughout     Balance  Balance limitations present     Static Sitting Balance  Good - able to maintain balance without handhold support, limited postural sway     Dynamic Sitting Balance  Good - accepts moderate challenge, able to maintain balance while picking object off floor     Static Standing Balance  Good - able to maintain balance without handhold support, limited postural sway     Dynamic Standing Balance  Fair - accepts minimal challenge, able to maintain balance while turning head/trunk     Extremity Assessment  Range of motion, strength,  muscle tone and/or sensation limitations present     LUE findings  WFL     RUE findings  WFL     LLE findings  See PT eval     RLE findings  See PT eval     Functional Mobility  Functional mobility deficits present     Bed Mobility Comments  NT, received, sitting in chair.      Transfers to/from Stand  Modified independent     Transfer Comments  from chair with unilateral UE support (cane)     Ambulation during functional tasks  Modified independent;Supervised     Device used for ambulation/mobility  Cane     Ambulation Distance  Pt demos functional mobility without AD, SUP short household distances. Pt mod I with SPC cane.      Other Objective Findings Pt received sitting in chair upon OT arrival and is agreeable to therapy. Pt demos good recall of spine precautions and carryover of brace management education. Pt  completes functional mobility in room, without AD with SUP, mod I with SPC and good safety. Pt reports continued questions or concerns for safety with ADL's and mobility upon d/c home. Pt left seated in chair with call light in place and all needs met at this time. RN updated.          OT Acute Tool Box     Row Name 08/20/20 1300          Cognition Assessment    Overall Cognitive Status  Intact - no cognitive limitations or impairments noted;At baseline level         Eval cont.     [place] Name 08/20/20 1300          Patient/Family Education    Learner(s)  Patient     Learner response to rehab patient education interventions  Verbalizes understanding;Able to return demonstrate teaching     Patient/family training comments  POC, precautions and implciations for ADL's and mobility, brace management.      [place] Name 08/20/20 1300          Assessment    Assessment Pt tolerated session well this date,  Demonstrating good carryover of precautions and brace management from previous session. Pt is overall Mod I for OOB ADL's and functional mobility with distant SUP suggested for bathing.     Pt has met all IP OT goals and has no continued skilled therapy needs at this time. Anticipate safe d/c home, when medically stable.      [place] Name 08/20/20 1300          Patient stated Goal    Patient stated goal  figure out incontinence.     [place] Name 08/20/20 1300          Goal 1 (Short Term)    Impairment  Activities of Daily Living - Upper Body Dressing     Custom goal  Don/doff TLSO with SUP and no cues     Goal Status  Met     Row Name 08/20/20 1300          Goal 2 (Short Term)    Impairment  Activities of Daily Living - Lower Body Dressing     Custom goal  SUP for LB dressing      Goal Status  Met     Row Name 08/20/20 1300          Treatment Plan Disussion    Treatment Plan Discussion and Agreement  Patient/family/caregiver stated understanding and agreement with the therapy plan     Row Name 08/20/20 1300          Treatment Plan     Duration of treatment (number of visits)  One time only, further treatment not indicated     Status of treatment  One time only treatment, further skilled therapy not indicated     [place] Name 08/20/20 1300          Patient Safety Considerations    Patient  safety considerations  Patient left sitting at end of treatment;Call light left in reach and fall precautions in place;Nursing notified of safety considerations at end of treatment     Patient assistive device requirements for safe ambulation  [place] Name 08/20/20 1300          Post Acute Discharge Recommendations    Discharge Rehabilitation Reccomendations ([place])  None- patient currently  has no further skilled therapy needs     Equipment recommendations  [place] Name 08/20/20 1300          Therapy Plan Communication    Therapy Plan Communication  Discussed therapy plan and patient's mobility status with Case Manager;Discussed therapy plan with Nursing and/or Physician     Row Name 08/20/20 1300          Occupational Therapy Patient Discharge Instructions    Your Occupational Therapist suggests the following  Continue to follow your prescribed mobility precautions when transferring to the chair and toilet as instructed;Continue to complete your self care Activities of Daily Living as frequently as possible;Continue to use energy conservation, pursed lip breathing and self-pacing when completing your self care Activities of Daily Living;Supervision is suggested when you     [place] Name 08/20/20 1300          Discharge Report    Discharge Report Date  08/20/20     Reason for discharge  Goals met, no further skilled treatment indicated     Discharge Destination  home     Patient participation  Excellent, patient participated in all treatment sessions     Patient compliance with therapy program  Excellent     Response to therapy  Excellent     Row Name 08/20/20 1300          Therapeutic Procedures    Therapeutic Activities ([id number])   Assistance/facilitation of bed mobility;Dynamic activities to improve performance of functional tasks/activities;Facilitation of safety awareness/responses during functional tasks;Progressive mobilization to improve functional independence;Patient education;Transfer training with weight shift and direction change;Weight shift activities to improve safety in unsupported sitting or standing;Graded physical assist for functional activities;Functional activities         Total TIMED Treatment (min)  15     Self-Care/ADL Training [phone])  Activities of daily living training;Patient education;Personal hygiene;Safety procedures;Self-care activities of dally living;Grooming;Dressing;Compensatory training         Total TIMED Treatment (min)  30           The occupational therapist of record is endorsed by evaluating occupational therapist.

## 2020-08-20 NOTE — Progress Notes (Signed)
Orthopedics Spine Progress Note  08/20/2020    Patient ID:  Name: Stephen [name]  MRN: [id number]  DOB: 06-15-1951    Procedures:  08/18/2020 L5-S1 ALIF, revision L3-S1 PSIF and decompression    Subjective:  Pain well controlled. Did well with PT. Voiding regularly but having some residual dribbling which he thinks is related to his prior bladder issues. Passing lots of gas. Wants to eat. Denies cp/sob/f/c/n/v. No new numbness/tingling.    Objective:  Vital Signs:  BP 121/55 (BP Location: Left arm, BP Patient Position: Semi-Fowlers)   Pulse 62   Temp 97.6 F (36.4 C)   Resp 16   Ht 5' 7.87" (1.724 m)   Wt 94.7 kg (208 lb 12.8 oz)   SpO2 96%   BMI 31.87 kg/m     Physical Exam:  General: patient awake, alert, and responding to commands; no apparent distress  Cardio: regular rate and rhythm per pulse  Respiratory: patient breathing comfortably without use of accessory muscles  Neck/Back/Abdomen: dressings in place, clean/dry/intact, Drain in place, output ss        Bilateral Lower Extremity   Motor:       Right           Left        Iliopsoas (L2/3)                     5/5           5/5         Quadriceps (L4)                    5/5           5/5        Tibialis Anterior (L4/5)           5/5           5/5        EHL (L5)                               5/5           5/5        GSC (S1)                              5/5           5/5   Sensation:        SILT in L2-S1 distributions.      Vascular Exam:        Warm and well perfused distally    Laboratory Data:   Lab Results   Component Value Date    WBC 13.8 (H) 08/19/2020    HGB 11.7 (L) 08/19/2020    HCT 34.8 (L) 08/19/2020    PLT 187 08/19/2020     Lab Results   Component Value Date    NA 144 08/19/2020    K 4.5 08/19/2020    CL 106 08/19/2020    BICARB 25 08/19/2020    BUN 14 08/19/2020    CREAT 1.02 08/19/2020    GLU 160 (H) 08/19/2020     8.7 08/19/2020     Lab Results   Component Value Date    INR 1.2 08/18/2020    PTT 28 08/18/2020          Input/Output:    Intake/Output Summary (Last  24 hours) at 08/20/2020 0704  Last data filed at 08/20/2020 0600  Gross per 24 hour   Intake 720 ml   Output 2010 ml   Net -1290 ml       Current Medications:  Current Facility-Administered Medications   Medication   . acetaminophen (TYLENOL) tablet 975 mg   . allopurinol (ZYLOPRIM) tablet 300 mg   . amLODIPINE (NORVASC) tablet 5 mg   . bisacodyl (DULCOLAX) suppository 10 mg   . ceFAZolin (ANCEF) 1,000 mg in sodium chloride 0.9 % 50 mL IVPB   . dexAMETHasone (DECADRON) injection 4 mg   . docusate sodium (COLACE) capsule 200 mg   . lactated ringers infusion   . melatonin tablet 5 mg   . menthol (CEPACOL) lozenge 3 mg   . multivitamin tablet 1 tablet   . ondansetron (ZOFRAN) injection 4 mg   . oxyCODONE (ROXICODONE) tablet 10 mg   . oxyCODONE (ROXICODONE) tablet 5 mg   . polyethylene glycol (MIRALAX) packet 17 g   . pregabalin (LYRICA) capsule 75 mg   . senna (SENOKOT) tablet 17.2 mg   . tiZANidine (ZANAFLEX) tablet 2 mg       Assessment/Plan:  69 year old male 2 Days Post-Op s/p above procedures. Patient doing well and progressing appropriately.   - Antibiotics: ancef while drain in place  - acute post op anemia, asymptomatic  - DVT Prophylaxis: no pharmacologic prophylaxis; SCDs/TED hose, ambulation  - Pain Medication: multimodal  - Diet: per vascular  - PT/OT:OOB with TLSO brace  - Post op imaging: upright XR pending  - Drain: continue  - Foley: DCed  - Other: aggressive incentive spirometry    - Dispo: pending  pain control on oral pain medication, CM clearance, PT clearance (need for continued inpatient physical therapy evaluation to assess patient's mobility, in order to ensure a safe dispo plan)    Tommy Rainwater, MD  Orthopedic Spine Fellow      Please page the Orthopaedic Spine Surgery Team with questions or concerns based on patient location:     Established spine floor patients at [place]: Ortho Spine 1 - [phone]      Established spine floor patients at  [facility]: Ortho Spine 2 - [phone]      New spine consultations not yet followed by spine team:    [facility] on call for "SPINE CONSULT" call schedule (Orthopaedic Spine versus Neurosurgery)   If Orthopaedic Spine is on call please page the PRIMARY CONSULT RESIDENT on call:   [place]: ORTHOPEDICS/TH   [place]: ORTHOPEDICS/HC

## 2020-08-20 NOTE — Plan of Care (Signed)
Problem: Promotion of Health and Safety  Goal: Promotion of Health and Safety  Description: The patient remains safe, receives appropriate treatment and achieves optimal outcomes (physically, psychosocially, and spiritually) within the limitations of the disease process by discharge.    Information below is the current care plan.  Outcome: Progressing  Flowsheets  Taken 08/20/2020 1617 by Towanda Octave, RN  Individualized Interventions/Recommendations #1: assess pt's vital signs and pain level  Individualized Interventions/Recommendations #2 (if applicable): medicated with oxycodone according to pain level  Individualized Interventions/Recommendations #3 (if applicable): ambulate with PT, using walker and TLSO brace one  Individualized Interventions/Recommendations #4 (if applicable): cont to monitor JP drain output  Outcome Evaluation (rationale for progressing/not progressing) every shift: Pt remained calm and cooperative, vital signs stable, pain is well controlled with oxy prn, needs attended and will cont to monitor.  Taken 08/20/2020 0808 by Towanda Octave, RN  Patient /Family stated Goal: pain control  Taken 08/18/2020 2058 by Britt Boozer, RN  Guidelines: Inpatient Nursing Guidelines

## 2020-08-20 NOTE — Progress Notes (Signed)
Vascular Surgery Progress Note    Patient Name: Stephen [name]  MRN: [id number]  Hospital Day:   2 days - Admitted on: 08/18/2020  Primary Vascular Complaint: anterior spine exposure    Past 24 hour events:   Started on sips and chips diet yesterday.    Subjective:  Patient denies nausea, vomiting. Passing flatus but no bowel movement yet. Tolerated sips and chips, hungry this AM. Reports some dribbling with urination which he has experienced in the past     Objective  Last Vitals  Temperature:  [97.6 F (36.4 C)-98.2 F (36.8 C)] 97.9 F (36.6 C) (09/02 0808)  Blood pressure (BP): (104-132)/(55-77) 132/61 (09/02 0808)  Heart Rate:  [60-91] 60 (09/02 0808)  Respirations:  [12-19] 18 (09/02 0808)  Pain Score: 8 (09/02 0834)  O2 Device: None (Room air) (09/02 0536)  SpO2:  [95 %-98 %] 97 % (09/02 0808)    Intake/Output                       08/19/20 0600 - 08/20/20 0559 08/20/20 0600 - 08/21/20 0559     [id number] 9528-4132 Total 0600-1759 4401-0272 Total                 Intake    P.O.  250  120 370  --  -- --    I.V.  500  -- 500  --  -- --    Total Intake 750 120 870 -- -- --       Output    Urine  1050  500 1550  --  -- --    Drains  260  240 500  60  -- 60    Total Output 1310 740 2050 60 -- 60       Net I/O     -560 -620 -1180 -60 -- -60          Physical Exam  General: NAD, A+Ox3  Cardiac: RRR  Pulm: Non-labored  Abdomen: Incision site CDI with small ecchymoses. No palpable collections. Soft. Non-distended  Extremities: Warm and well perfused, no significant edema    Labs:    Lab Results   Component Value Date    WBC 13.8 (H) 08/19/2020    RBC 3.85 (L) 08/19/2020    HGB 11.7 (L) 08/19/2020    HCT 34.8 (L) 08/19/2020    MCV 90.4 08/19/2020    MCHC 33.6 08/19/2020    RDW 14.7 (H) 08/19/2020    PLT 187 08/19/2020    MPV 11.0 08/19/2020     Lab Results   Component Value Date    NA 144 08/19/2020    K 4.5 08/19/2020    CL 106 08/19/2020    BICARB 25 08/19/2020    BUN 14 08/19/2020    CREAT 1.02 08/19/2020    GLU  160 (H) 08/19/2020    [place] 8.7 08/19/2020    MG 2.0 03/03/2018     Lab Results   Component Value Date    INR 1.2 08/18/2020    PTT 28 08/18/2020       Microbiology:  No results found for: BLOODCULT  No results found for: CXBS      Assessment & Plan  Assessment:  69 year old male POD #2 s/p ALIF L5-S1 and posterior spinal instrumentation    Plan:   Advance diet to clear liquids today   OK for PO meds   Bowel regimen: colace [place], senna [place], miralax [place], bisacodyl PRN  Remainder of care per primary team    Patient discussed with Vascular Surgery Fellow Dr. Claiborne Rigg, MD  Vascular Surgery   Floor [phone]  Consult [phone]  ICU [phone]

## 2020-08-20 NOTE — Interdisciplinary (Addendum)
POD#2 for spine surgery.  Patient agreeable to send referral to [facility].  He was serviced by same agency last year.  Possible DC tomorrow pending medical clearance.  No DME needed.  CM to follow.     13:28 - patient accepted by [facility] for PT/RN

## 2020-08-21 LAB — GENERAL AEROBIC CULTURE, OR ONLY: General Aerobic Culture, OR Only: NO GROWTH

## 2020-08-21 LAB — HEMOGRAM, BLOOD
Hct: 33.4 % — ABNORMAL LOW (ref 40.0–50.0)
Hgb: 11.2 gm/dL — ABNORMAL LOW (ref 13.7–17.5)
MCH: 30 pg (ref 26.0–32.0)
MCHC: 33.5 g/dL (ref 32.0–36.0)
MCV: 89.5 um3 (ref 79.0–95.0)
MPV: 11.2 fL (ref 9.4–12.4)
Plt Count: 166 10*3/uL (ref 140–370)
RBC: 3.73 10*6/uL — ABNORMAL LOW (ref 4.60–6.10)
RDW: 14.9 % — ABNORMAL HIGH (ref 12.0–14.0)
WBC: 15 10*3/uL — ABNORMAL HIGH (ref 4.0–10.0)

## 2020-08-21 NOTE — Plan of Care (Signed)
Problem: Promotion of Health and Safety  Goal: Promotion of Health and Safety  Description: The patient remains safe, receives appropriate treatment and achieves optimal outcomes (physically, psychosocially, and spiritually) within the limitations of the disease process by discharge.    Information below is the current care plan.  Outcome: Progressing  Flowsheets  Taken 08/21/2020 1740 by Stephanie Coup, RN  Guidelines: Inpatient Nursing Guidelines  Outcome Evaluation (rationale for progressing/not progressing) every shift: Pt. AOX4, NAD, MAE. TLSO when OOB. Report got from [facility]. continue with POC  Taken 08/21/2020 0900 by Mackie Pai, RN  Patient /Family stated Goal: go home soon

## 2020-08-21 NOTE — Progress Notes (Signed)
Orthopedics Spine Progress Note  08/21/2020    Patient ID:  Name: Stephen [name]  MRN: [id number]  DOB: May 28, 1951    Procedures:  08/18/2020 L5-S1 ALIF, revision L3-S1 PSIF and decompression    Subjective:  Did well yesterday. Walked 1053ft with PT. Pain well controlled. All of prior leg pain resolved. Very pleased. Passing lots of gas and thinks he may need to stool today.  Denies cp/sob/f/c/n/v. No new numbness/tingling.    Objective:  Vital Signs:  BP 120/64 (BP Location: Right arm, BP Patient Position: Semi-Fowlers)   Pulse 52   Temp 97.4 F (36.3 C)   Resp 18   Ht 5' 7.87" (1.724 m)   Wt 94.7 kg (208 lb 12.8 oz)   SpO2 96%   BMI 31.87 kg/m     Physical Exam:  General: patient awake, alert, and responding to commands; no apparent distress  Cardio: regular rate and rhythm per pulse  Respiratory: patient breathing comfortably without use of accessory muscles  Neck/Back/Abdomen: dressings in place, clean/dry/intact, Drain in place, output ss        Bilateral Lower Extremity   Motor:       Right           Left        Iliopsoas (L2/3)                     5/5           5/5         Quadriceps (L4)                    5/5           5/5        Tibialis Anterior (L4/5)           5/5           5/5        EHL (L5)                               5/5           5/5        GSC (S1)                              5/5           5/5   Sensation:        SILT in L2-S1 distributions.      Vascular Exam:        Warm and well perfused distally    Laboratory Data:   Lab Results   Component Value Date    WBC 13.8 (H) 08/19/2020    HGB 11.7 (L) 08/19/2020    HCT 34.8 (L) 08/19/2020    PLT 187 08/19/2020     Lab Results   Component Value Date    NA 144 08/19/2020    K 4.5 08/19/2020    CL 106 08/19/2020    BICARB 25 08/19/2020    BUN 14 08/19/2020    CREAT 1.02 08/19/2020    GLU 160 (H) 08/19/2020    [place] 8.7 08/19/2020     No results found for: INR, PTT      Input/Output:    Intake/Output Summary (Last 24 hours) at 08/21/2020 0734  Last  data filed at 08/21/2020 0556  Gross per 24  hour   Intake 2377.5 ml   Output 1980 ml   Net 397.5 ml       Current Medications:  Current Facility-Administered Medications   Medication   . acetaminophen (TYLENOL) tablet 975 mg   . allopurinol (ZYLOPRIM) tablet 300 mg   . amLODIPINE (NORVASC) tablet 5 mg   . bisacodyl (DULCOLAX) suppository 10 mg   . ceFAZolin (ANCEF) 1,000 mg in sodium chloride 0.9 % 50 mL IVPB   . dexAMETHasone (DECADRON) injection 4 mg   . docusate sodium (COLACE) capsule 200 mg   . lactated ringers infusion   . melatonin tablet 5 mg   . menthol (CEPACOL) lozenge 3 mg   . multivitamin tablet 1 tablet   . ondansetron (ZOFRAN) injection 4 mg   . oxybutynin (DITROPAN) tablet 2.5 mg   . oxyCODONE (ROXICODONE) tablet 10 mg   . oxyCODONE (ROXICODONE) tablet 5 mg   . polyethylene glycol (MIRALAX) packet 17 g   . pregabalin (LYRICA) capsule 75 mg   . senna (SENOKOT) tablet 17.2 mg   . tiZANidine (ZANAFLEX) tablet 2 mg       Assessment/Plan:  69 year old male 3 Days Post-Op s/p above procedures. Patient doing well and progressing appropriately.   - Antibiotics: ancef while drain in place  - acute post op anemia, asymptomatic  - DVT Prophylaxis: no pharmacologic prophylaxis; SCDs/TED hose, ambulation  - Pain Medication: multimodal  - Diet: per vascular  - PT/OT:OOB with TLSO brace  - Post op imaging: upright XR pending  - Drain: continue  - Foley: DCed  - Other: aggressive incentive spirometry    - Dispo: pending drain management, diet and return of bowel function    Tommy Rainwater, MD  Orthopedic Spine Fellow      Please page the Orthopaedic Spine Surgery Team with questions or concerns based on patient location:     Established spine floor patients at [place]: Ortho Spine 1 - [phone]      Established spine floor patients at [facility]: Ortho Spine 2 - [phone]      New spine consultations not yet followed by spine team:    View [facility] on call for "SPINE CONSULT" call schedule (Orthopaedic Spine  versus Neurosurgery)   If Orthopaedic Spine is on call please page the [place] on call:   [place]: ORTHOPEDICS/TH   [place]: ORTHOPEDICS/HC

## 2020-08-21 NOTE — Plan of Care (Signed)
Problem: Promotion of Health and Safety  Goal: Promotion of Health and Safety  Description: The patient remains safe, receives appropriate treatment and achieves optimal outcomes (physically, psychosocially, and spiritually) within the limitations of the disease process by discharge.    Information below is the current care plan.  Outcome: Progressing  Flowsheets  Taken 08/21/2020 0520 by Flora Lipps, RN  Individualized Interventions/Recommendations #3 (if applicable): Reinforced fall precautions with patient and instructed patient to call for assistance as needed.  Outcome Evaluation (rationale for progressing/not progressing) every shift: Patient's VSS, afebrile, A&Ox4, tolerating clear diet, pain well controlled with current pain regimen, drain output moderate, incisions sites, CDI.  Taken 08/20/2020 2100 by Flora Lipps, RN  Patient /Family stated Goal: sleep tonight  Taken 08/20/2020 1617 by Towanda Octave, RN  Individualized Interventions/Recommendations #1: assess pt's vital signs and pain level  Individualized Interventions/Recommendations #2 (if applicable): medicated with oxycodone according to pain level  Individualized Interventions/Recommendations #4 (if applicable): cont to monitor JP drain output  Taken 08/18/2020 2058 by Britt Boozer, RN  Guidelines: Inpatient Nursing Guidelines

## 2020-08-21 NOTE — Progress Notes (Signed)
Vascular Surgery Progress Note    Patient Name: Stephen [name]  MRN: [id number]  Hospital Day:   3 days - Admitted on: 08/18/2020  Primary Vascular Complaint: Anterior spine exposure      Subjective:  Patient reports tolerating clear liquid diet. No bowel movements yet. Tolerating OOB with brace. Pain well controlled.     Objective  Last Vitals  Temperature:  [97.4 F (36.3 C)-98.4 F (36.9 C)] 97.6 F (36.4 C) (09/03 1217)  Blood pressure (BP): (120-145)/(59-79) 145/79 (09/03 1217)  Heart Rate:  [52-62] 58 (09/03 1217)  Respirations:  [18-22] 18 (09/03 1217)  Pain Score: NA (pre med, non-pain or scheduled) (09/03 1404)  O2 Device: None (Room air) (09/03 0749)  SpO2:  [95 %-98 %] 97 % (09/03 1217)  Intake/Output                       08/20/20 0600 - 08/21/20 0559 08/21/20 0600 - 08/22/20 0559     5520-8022 3361-2244 Total 0600-1759 9753-0051 Total                 Intake    P.O.  760  -- 760  240  -- 240    I.V.  1617.5  -- 1617.5  50  -- 50    Total Intake 2377.5 -- 2377.5 290 -- 290       Output    Urine  --  1600 1600  --  -- --    Drains  190  250 440  60  -- 60    Total Output 190 1850 2040 60 -- 60       Net I/O     2187.5 -1850 337.5 230 -- 230          Physical Exam  General: NAD, A+Ox3  Cardiac: RRR  Pulm: Non-labored  Abdomen: Soft, non-tender  Extremities: No edema, warm.     Labs:  Lab Results   Component Value Date    WBC 15.0 (H) 08/21/2020    RBC 3.73 (L) 08/21/2020    HGB 11.2 (L) 08/21/2020    HCT 33.4 (L) 08/21/2020    MCV 89.5 08/21/2020    MCHC 33.5 08/21/2020    RDW 14.9 (H) 08/21/2020    PLT 166 08/21/2020    MPV 11.2 08/21/2020     Lab Results   Component Value Date    NA 144 08/19/2020    K 4.5 08/19/2020    CL 106 08/19/2020    BICARB 25 08/19/2020    BUN 14 08/19/2020    CREAT 1.02 08/19/2020    GLU 160 (H) 08/19/2020    [place] 8.7 08/19/2020    MG 2.0 03/03/2018     No results found for: INR, PTT    Microbiology:  No results found for: BLOODCULT  No results found for: CXBS      Assessment &  Plan  Assessment:  69 year old male POD# 3 s/p ALIF    Plan:   Bowel regimen   Advance diet to regular   Care per primary team    Patient discussed with Vascular Surgery Fellow    Myrtie Hawk, [place]  Vascular Surgery  08/21/20  3:39 PM

## 2020-08-21 NOTE — Discharge Instructions (Signed)
Surgeon:   Dr. Lelon Huh    Follow-up Appointment:  Your clinic appointment will be:    Future Appointments   Date Time Provider [place]   09/10/2020 10:00 AM Sheria Lang, NP [facility]       If this appointment was not scheduled while you were in the hospital then our clinic scheduler will call you in the next few days to schedule this appointment. If you do not hear from [name] within 3 days you should call the clinic at [phone] (for [facility]) or [phone] (for [place]). You should see [name] in clinic within 2 weeks from hospital discharge.       Reasons to Contact a Doctor Urgently:     Call 911 or return to the hospital immediately if: If you have chest pain, sudden onset shortness of breath, difficulty breathing, dizziness, lightheadedness, change in mental status or any other concerning issues.    If you develop a fever (>101), redness or drainage from the surgical incision site, swelling or redness of an extremity, profuse bleeding or excessive drainage from incisional site, or other concerning issues please call our office to arrange for an evaluation or go to the emergency department.     If you have any questions about your hospital care, your medications, or if you have new or concerning symptoms soon after going home from the hospital, your hospital physician can be contacted in the following manner:   [facility] operator at [phone].  [place] Phone Number: [phone] ([place]) or [phone] [facility]).    Once you are able to see your primary care physician (PCP), your PCP will then be responsible for further medication refills, or appointment referrals.      General Instructions for After Discharge    Your activity level at home should be:  As tolerated with no bending, twisting, or lifting over 5 lbs. (approximately a gallon of milk).    If you were given instructions to wear a back brace (TLSO) , please wear the brace when out of  bed except when in the shower.      Wound Care/Hygiene:  Please leave your surgical dressing on for 5 days from the date of surgery. No showering during that time. Sponge baths only.     Keep dressing clean and dry at all time.     Prior to discharge, ask your nurse to provide you with extra bandages should the existing one get dirty or fall off.    On day 5 (September 5th) , take your dressing off and you may shower. After you shower Please pat the wound dry with a clean towel so you do not disturb any of the scab formation. Do not pull at the Dermabond, but leave it in place until it falls off on its own.  Cover the incision with sterile guaze and paper tape and change dressing daily until your first post operative visit.    Please call us if there is redness, swelling or drainage.     If any concerns with showering, then OK to leave all dressings in place and sponge bathe only.    Do not bathe in a tub or swim or Jacuzzi (ie do not submerge the incision) until instructed to do so by your surgeon.    Do not use ointments or creams on the incision.      You may notice some bruising around the incision.  This is not uncommon and should begin  to go away within the first 2 weeks after surgery.      Diet:    Medications:    Take medications as prescribed. The short acting narcotic pain medication (usually Oxycodone) can be continued as needed, not to exceed the directions on the prescription (usually up to every 4 hours or 6 pills per day). To help wean off of the pain medications or to supplement your pain control you can use Tylenol to help with pain. If you run out of medication before your follow up visit, please let our clinic know so that we may authorize a refill if indicated. Please keep in mind it may take up to 72 hours for a refill to be authorized.    You may be given a one time prescription for Lidocaine patches. These are usually only covered once by insurance. However, 4% lidocaine patches can be  purchased over the counter if you run out.    Your medication list is located on this After Visit Summary in the Current Discharge Medication List section.  Your nurse will review this information with you before you leave the hospital.    It is very important for you to keep a current medication list with you in order to assist your doctors with your medical care.  Bring this After Visit Summary with you to your follow up appointments.    While you are taking the narcotic pain medication, take the stool softener and drink plenty of water to prevent constipation.    Do not drive while on narcotic medications.    Do not mix narcotic medications with alcohol.       What Needs to Happen Next After Discharge -- Appointments and Follow Up    You should have an office appointment about 2 weeks following your discharge from the hospital.  If you do not please call [phone] ([place]) or [phone] [facility]).     Generally, you should return to see your surgeon at the following intervals, but this may be individualized depending on special circumstances.    Any appointments already scheduled at [place] clinics will be listed in the Future Appointments section at the top of this After Visit Summary.  Any appointments that have been requested, but have not yet been scheduled, will be listed below that under Post Discharge Referrals.

## 2020-08-22 ENCOUNTER — Other Ambulatory Visit: Payer: Self-pay

## 2020-08-22 MED ORDER — PNEUMOCOCCAL VAC POLYVALENT 25 MCG/0.5ML IJ INJ (CUSTOM)
0.5000 mL | INJECTION | INTRAMUSCULAR | Status: DC
Start: 2020-08-22 — End: 2020-08-22

## 2020-08-22 MED ORDER — SENNA 8.6 MG OR TABS
17.2000 mg | ORAL_TABLET | Freq: Every morning | ORAL | 0 refills | Status: DC
Start: 2020-08-22 — End: 2020-09-07
  Filled 2020-08-22: qty 15, 7d supply, fill #0

## 2020-08-22 MED ORDER — OXYCODONE HCL 5 MG OR TABS
5.0000 mg | ORAL_TABLET | ORAL | 0 refills | Status: DC | PRN
Start: 2020-08-22 — End: 2020-09-03
  Filled 2020-08-22: qty 60, 10d supply, fill #0

## 2020-08-22 MED ORDER — ACETAMINOPHEN 325 MG PO TABS
975.0000 mg | ORAL_TABLET | Freq: Three times a day (TID) | ORAL | 0 refills | Status: DC
Start: 2020-08-22 — End: 2020-09-21
  Filled 2020-08-22: qty 100, 11d supply, fill #0

## 2020-08-22 MED ORDER — DOCUSATE SODIUM 100 MG OR CAPS
200.0000 mg | ORAL_CAPSULE | Freq: Every evening | ORAL | 0 refills | Status: DC
Start: 2020-08-22 — End: 2020-09-07
  Filled 2020-08-22: qty 15, 7d supply, fill #0

## 2020-08-22 NOTE — Interdisciplinary (Signed)
08/22/20 1413   [place], [place], [place], [place], [place]  [phone] RN/PT   Final Discharge Destination/Services   Final Discharge Destination/Services * Home;Home Health

## 2020-08-22 NOTE — Plan of Care (Signed)
Problem: Promotion of Health and Safety  Goal: Promotion of Health and Safety  Description: The patient remains safe, receives appropriate treatment and achieves optimal outcomes (physically, psychosocially, and spiritually) within the limitations of the disease process by discharge.    Information below is the current care plan.  Outcome: Progressing  Flowsheets  Taken 08/22/2020 0406 by Flora Lipps, RN  Outcome Evaluation (rationale for progressing/not progressing) every shift: Patient's VSS, afebrile, tolerating (and enjoying) diet, pain well controlled with current pain regimen, incision sites intact. Drain output moderate.  Taken 08/21/2020 2100 by Flora Lipps, RN  Patient /Family stated Goal: sleep tonight  Taken 08/21/2020 1740 by Stephanie Coup, RN  Guidelines: Inpatient Nursing Guidelines  Taken 08/21/2020 0520 by Flora Lipps, RN  Individualized Interventions/Recommendations #3 (if applicable): Reinforced fall precautions with patient and instructed patient to call for assistance as needed.  Taken 08/20/2020 1617 by Towanda Octave, RN  Individualized Interventions/Recommendations #1: assess pt's vital signs and pain level  Individualized Interventions/Recommendations #2 (if applicable): medicated with oxycodone according to pain level  Individualized Interventions/Recommendations #4 (if applicable): cont to monitor JP drain output

## 2020-08-22 NOTE — Plan of Care (Addendum)
Problem: Promotion of Health and Safety  Goal: Promotion of Health and Safety  Description: The patient remains safe, receives appropriate treatment and achieves optimal outcomes (physically, psychosocially, and spiritually) within the limitations of the disease process by discharge.    Information below is the current care plan.  Outcome: Discharged  Flowsheets  Taken 08/22/2020 1544  Guidelines: Inpatient Nursing Guidelines  Outcome Evaluation (rationale for progressing/not progressing) every shift: Pt. able to talk to SW prior to d/c  Taken 08/22/2020 0810  Patient /Family stated Goal: " to go home"  Note:   Patient Discharge/Education  AVS printed/explained to pt: Yes  Learner: patient  Language: English  Use of Interpreter: N/A  D/c medication or prescription: will be delivered at bedside  Method: Explanation and Handout  Treatment education given: Yes  Response: Verbalizes and demonstrate understanding  Stable upon discharge: Yes;  all belongings with the patient. Will eat dinner first prior to d/c     D/cd w/c transport

## 2020-08-22 NOTE — Op Note (Unsigned)
DATE OF SERVICE:  08/18/2020    PREOPERATIVE DIAGNOSIS:    1. Degenerative spondylosis and stenosis with radiculopathy.  2. Status post remote lumbar fusion L3 through L5 with adjacent  segment degeneration.     POSTOPERATIVE DIAGNOSIS:    1. Degenerative spondylosis and stenosis with radiculopathy.  2. Status post remote lumbar fusion L3 through L5 with adjacent  segment degeneration.     PROCEDURE PERFORMED:    1. Anterior retroperitoneal approach per Vascular Surgery.  2. Anterior lumbar interbody fusion L5-S1 with PEEK cage (DePuy  Synthes), Formagraft, rhBMP-2.   3. Placement of biomechanical vertebral structural device consisting  of polyetheretherketone cage.   4. Anterior spinal instrumentation L5-S1 with anterior plate and  screws (DePuy Synthes), placed independent and separate from  interbody cage device.   5. Anterior lumbar diskectomy L5-S1 with decompression of cauda  equina and exploration of nerve roots bilateral L5-S1.   6. Interpretation of intraoperative fluoroscopy.    SURGEON/STAFF:  Obadiah Dennard, MD    ATTENDING SURGEON:  Jeramiah Mccaughey, MD (for spine).   Darryl Nestle, MD (for vascular).     Note, the presence of Dr. Orene Desanctis was required due to complexity of  anterior retroperitoneal approach for vertebral column access.     ASSISTANTS:  Verdon Cummins, MD.   Wellington Hampshire, MD.     Note, there was no qualified orthopedic surgery resident available to  assist with this case.     ANESTHESIA:  General.    FINDINGS:    1. Degenerative disk disease, spondylosis.  2. Stenosis.    WOUND CLASSIFICATION:  Class 1.    WOUND CLOSURE STATUS:  All layers closed.    SPECIMENS:  Disk cultures to Micro.    INTRAVENOUS FLUIDS:  1 L LR.    BLOOD PRODUCTS:  None.    ESTIMATED BLOOD LOSS:  50 cc.    URINE OUTPUT:  200 cc.    COMPLICATIONS:  None.    INDICATIONS:  Patient is a 69 year old male who has ongoing worsening  complaints of low back pain and radiating bilateral lower extremity  pain.  He is well  known to Spine Service having previously undergone  lumbar decompression and fusion following which he had done very  well, however, over the last several months he has developed  recurrent low back pain with pain radiating into the gluteal region  and down the posterior legs.  He states that symptoms have become  increasingly painful, significantly limiting his activities and  hobbies as well as quality of life.  He is unable to stand or walk  for any significant length of time and has significant limitation of  activities of daily living.  He has had extensive and exhaustive  conservative management in the form of time, activity modification,  pain medication, physical therapy, and injections without any relief  of his symptoms and with ongoing progression.  Imaging demonstrates  evidence of prior lumbar fusion L3 through L5 with retained  instrumentation with associated degenerative disk disease and  spondylosis at L5-S1 with severe foraminal stenosis and disk collapse  which does correlate with the distribution of his symptoms.  We  discussed options again in extensive detail and he was interested in  surgical address which we explained would involve staged approach  with anterior lumbar interbody fusion L5-S1 with anterior  retroperitoneal approach per Vascular Surgery Service, followed then  by staged completion of revision lumbar decompression and fusion L3  through sacrum.  We have discussed  the planned procedure as well as  risks, benefits, and alternatives with him in detail.  Risks include,  but are not limited to infection, bleeding, damage to nerves and  vessels, dural tear, ongoing back pain as well as leg pain and/or  weakness and/or numbness and paresthesias, hardware complications or  failure, pseudarthrosis or nonunion, additional procedures, as well  as the risk of general anesthesia including but not limited to DVT,  PE, stroke, myocardial infarction, loss of life.  He expressed  his  understanding and willingness to proceed in the form of a signed  consent.     PROCEDURE NARRATIVE:  The patient was met in the preoperative holding  area where we again reviewed the planned procedure with him and  answered all of his questions.  Operative site was marked and he was  subsequently taken to the operating theater where he succumbed to  general endotracheal anesthesia and was placed supine on a regular  table with all of his extremities and bony prominences appropriately  padded and protected.  He received appropriate preoperative  antibiotic prophylaxis.  The area over the anterior abdomen was then  prepped and draped in usual sterile fashion.  Prior to proceeding, we  performed a formal preoperative verification and time-out procedure  in which we confirmed patient identity by name, number and birth date  as well as the planned procedure and availability of all necessary  equipment.     Anterior retroperitoneal approach was carried out per the Vascular  Surgery service.  For additional details, please refer to separately  dictated operative note.  Upon accessing the vertebral column, a  spinal needle was placed at the L5-S1 disk space and AP and lateral  fluoroscopic imaging was obtained for level of confirmation.  This  was recorded as an intraoperative radiographic time-out procedure.  The Vascular team continued exposure and placed retractors.  At this  point, we then proceeded with diskectomy in standard fashion with  annulotomy knife followed by Cobb elevators to develop the  cartilaginous endplates and we completed diskectomy with pituitary  and Kerrison rongeurs.  There was significant degenerative disk  disease and disk collapse with marked dissolution of the disk and  there was phlegmonous changes throughout the disk space in addition  over the anterior disk, concerning for potential inflammatory versus  infectious process.  As such, disk cultures were sent to Micro and  Pathology for  further evaluation.  We continued diskectomy in  standard fashion with Cobb curettes to debride the cartilaginous  endplates, taking care not to violate the bony endplates.  We  continued circumferential release of the annulus and a high-speed  matchstick bur was used to resect prominent bony sclerotic endplates  as well as to resect the posterior disk osteophyte complex, which was  quite severe and resulting in severe central as well as foraminal  stenosis.  Straight and forward angle Cobb curettes were used to  develop a plane between the posterior disk osteophyte complex and PLL  and the underlying dura and we used 2 and 4 mm Kerrison rongeurs to  further resect the posterior disk osteophyte complex and PLL, first  centrally, which we then widened bilaterally, ensuring wide  decompression of the cauda equina centrally as well as the exiting  nerve roots bilaterally at L5-S1.  This allowed for further  mobilization of the disk space.  We then proceeded with trialing.  We  trialed a medium footprint 18 degree lordotic cage, which we sized up  to 19 mm in height, which demonstrated excellent restoration of  segmental parameters and foraminal volume and disk height on AP and  lateral fluoroscopic imaging.  We therefore then proceeded with the  preparation of the definitive PEEK cage (DePuy Synthes) which was  similarly sized and prepared with Formagraft and rhBMP-2.  This was  inserted under direct visual as well as fluoroscopic guidance with  excellent interference fit.  We then proceeded with anterior  instrumentation, performed separately and independent of the cage  device.  Anterior plate was sized and we used a 15 mm awl to prepare  screw tracts into both L5 and S1.  5 x 25 mm screws were positioned  into L5 and S1 with adequate purchase and locked into the anterior  plate with torque-limiting screwdriver.  We obtained final AP and  lateral fluoroscopic imaging demonstrating excellent positioning  of  instrumentation with restoration of segmental parameters as well as  disk height and foraminal volume.     At this point, we confirmed excellent hemostasis.  Wound was  copiously irrigated.  Wound closure was carried out per the Vascular  Surgery service.  For additional details, please refer to separately  dictated operative note.     DISPOSITION:  Upon completion of first stage of surgery, the patient  was deemed appropriate to proceed with completion of second stage of  surgery, which will be dictated under separate heading.       Job #:  [id number]

## 2020-08-22 NOTE — Interdisciplinary (Signed)
08/22/20 1444   Assessment   Assessment Type Discharge   Referral Information   Referral Type Community Resources/Referrals   Plans/Interventions/Discharge   Plan/Interventions Resources given   Discharge Information for Patient - THIS GROUP FILES TO THE PATIENT'S AVS   Information from Social Work [facility] [phone]; Care Placement Services [phone]; [facility] [employer] [facility] management services [phone]       Social Work:  Per Therapist, sports, pt requested to speak w/ social work prior to discharge today. This sw spoke with pt who requested assistance with applying for disability and alternative living at [facility]; SW provided brief overview of process, education, and navigation of systems; provided resources for pt to follow through on on own; pt verbalized appreciation and plan to f/u. Plan for pt discharge to previous living situation w/ sister and has arranged d/c transport himself. Please page weekend social worker should additional needs arise this weekend, otherwise please page assigned social worker.  Weekend pager: [address], LCSW

## 2020-08-22 NOTE — Progress Notes (Signed)
Orthopedics Spine Progress Note  08/22/2020    Patient ID:  Name: Stephen [name]  MRN: [id number]  DOB: October 29, 1951    Procedures:  08/18/2020 L5-S1 ALIF, revision L3-S1 PSIF and decompression    Subjective:  Continues to be very happy that prior leg pain has resolved. Back pain is well controlled. Denies new numbness/tingling.     Objective:  Vital Signs:  BP 120/55 (BP Location: Right arm, BP Patient Position: Semi-Fowlers)   Pulse 51   Temp 97.3 F (36.3 C)   Resp 18   Ht 5' 7.87" (1.724 m)   Wt 94.7 kg (208 lb 12.8 oz)   SpO2 95%   BMI 31.87 kg/m     Physical Exam:  General: patient awake, alert, and responding to commands; no apparent distress  Cardio: regular rate and rhythm per pulse  Respiratory: patient breathing comfortably without use of accessory muscles  Neck/Back/Abdomen: incision c/d/i, dressing changed, Drain in place, output ss (140/60) - removed        Bilateral Lower Extremity   Motor:       Right           Left        Iliopsoas (L2/3)                     5/5           5/5         Quadriceps (L4)                    5/5           5/5        Tibialis Anterior (L4/5)           5/5           5/5        EHL (L5)                               5/5           5/5        GSC (S1)                              5/5           5/5   Sensation:        SILT in L2-S1 distributions.      Vascular Exam:        Warm and well perfused distally    Laboratory Data:   Lab Results   Component Value Date    WBC 15.0 (H) 08/21/2020    HGB 11.2 (L) 08/21/2020    HCT 33.4 (L) 08/21/2020    PLT 166 08/21/2020     Lab Results   Component Value Date    NA 144 08/19/2020    K 4.5 08/19/2020    CL 106 08/19/2020    BICARB 25 08/19/2020    BUN 14 08/19/2020    CREAT 1.02 08/19/2020    GLU 160 (H) 08/19/2020    [place] 8.7 08/19/2020     No results found for: INR, PTT      Input/Output:    Intake/Output Summary (Last 24 hours) at 08/22/2020 1012  Last data filed at 08/22/2020 0855  Gross per 24 hour   Intake 1270 ml   Output 2251 ml  Net -981 ml       Current Medications:  Current Facility-Administered Medications   Medication   . acetaminophen (TYLENOL) tablet 975 mg   . allopurinol (ZYLOPRIM) tablet 300 mg   . amLODIPINE (NORVASC) tablet 5 mg   . bisacodyl (DULCOLAX) suppository 10 mg   . ceFAZolin (ANCEF) 1,000 mg in sodium chloride 0.9 % 50 mL IVPB   . dexAMETHasone (DECADRON) injection 4 mg   . docusate sodium (COLACE) capsule 200 mg   . lactated ringers infusion   . melatonin tablet 5 mg   . menthol (CEPACOL) lozenge 3 mg   . multivitamin tablet 1 tablet   . ondansetron (ZOFRAN) injection 4 mg   . oxyCODONE (ROXICODONE) tablet 10 mg   . oxyCODONE (ROXICODONE) tablet 5 mg   . polyethylene glycol (MIRALAX) packet 17 g   . pregabalin (LYRICA) capsule 75 mg   . senna (SENOKOT) tablet 17.2 mg   . tiZANidine (ZANAFLEX) tablet 2 mg       Assessment/Plan:  69 year old male 4 Days Post-Op s/p above procedures. Patient doing well and progressing appropriately.   - Antibiotics: dc ancef  - acute post op anemia, asymptomatic  - DVT Prophylaxis: no pharmacologic prophylaxis; SCDs/TED hose, ambulation  - Pain Medication: multimodal  - Diet: per vascular  - PT/OT:OOB with TLSO brace  - Post op imaging: upright XR pending  - Drain: removed today  - Foley: DCed  - Other: aggressive incentive spirometry    - Dispo: home today with HH-PT    Isaac Bliss, MD  Orthopedic Surgery Resident      Please page the Orthopaedic Spine Surgery Team with questions or concerns based on patient location:     Established spine floor patients at [facility]: Ortho Spine 1 - [phone]      Established spine floor patients at [facility]: Ortho Spine 2 - [phone]      New spine consultations not yet followed by spine team:    [facility] on call for "SPINE CONSULT" call schedule (Orthopaedic Spine versus Neurosurgery)   If Orthopaedic Spine is on call please page the [place] on call:   [place]: ORTHOPEDICS/TH   [place]: ORTHOPEDICS/HC

## 2020-08-25 ENCOUNTER — Telehealth (HOSPITAL_BASED_OUTPATIENT_CLINIC_OR_DEPARTMENT_OTHER): Payer: Self-pay | Admitting: Urology

## 2020-08-25 ENCOUNTER — Telehealth (HOSPITAL_BASED_OUTPATIENT_CLINIC_OR_DEPARTMENT_OTHER): Payer: Self-pay | Admitting: Orthopaedic Surgery of the Spine

## 2020-08-25 DIAGNOSIS — N39492 Postural (urinary) incontinence: Secondary | ICD-10-CM

## 2020-08-25 DIAGNOSIS — N320 Bladder-neck obstruction: Secondary | ICD-10-CM

## 2020-08-25 LAB — ANAEROBIC CULTURE

## 2020-08-25 NOTE — Telephone Encounter (Signed)
This would have to be a urology call

## 2020-08-25 NOTE — Telephone Encounter (Signed)
Spoke with pt using two pt identifiers.    Pt states he since his spinal surgery 1 week ago he has been dribbling urine and has episodes in which "a lot of urine shoots out." Denies any other symptoms.    Denies any gross hematuria, dysuria, frequency, urgency, cloudy urine or foul odor, fevers, chills, night sweats, abdominal pain/pressure/distention, flank pain, pelvic pain, nausea, or vomiting. ER precautions advised.    Order pended for Oxybutynin, will route to MD to review.

## 2020-08-25 NOTE — Telephone Encounter (Signed)
Called pt, no answer, left message for a return call.

## 2020-08-25 NOTE — Telephone Encounter (Signed)
Stephen [name] 69 year old   ESTABLISHED PAT ONLY     Caller's Name, Relationship to patient:  [name], Self        Reason for call (including the symptoms/concerns patient has):   Patient is having a lot of drainage from his foley. He says ever time he gets up he has alot of drainage. He would like to further discuss with RN/MD in case he needs an urgent referral to Urology.      Current Pain Level (0 Lowest -10 highest) :   7-8        Provider Name:  Dr. Earnest Conroy       If  Patient had surgery: (Procedure Name) (DOS)  08/18/20  Procedure: Stage 1: Anterior lumbar interbody fusion, bone morphogenic protein, lumbar 5- sacral 1   Procedure: Stage 2: Lumbar decompression and fusions with instrumentation, allograft versus autograft, lumbar 5-sacral     LOV Date and Plan:  07/29/20  [facility]  Assessment  Stephen [name] is a 69 year old male with history as stated above presenting for preoperative evaluation prior to Stage 1: ALIF L5-S1 ; Stage 2 revision laminectomy and PSIF L3 thru S1 by Dr Zlomislic on 6/64/40.        PLAN   Anesthesia pre-op:  08/03/20   Pt has covid testing scheduled.  08/15/20   Vascular clearance for ALIF not completed.  Pt advised to make appt with vascular team.   PCP clearance completed per pt on 7/14.  Will request records.   Final clearance is made by anesthesia team on day of surgery   Per ortho department protocol, bactroban nasal prescribed twice daily for 5 days prior to surgery as MRSA prophylaxis   CHG given   Pt advised:  ? Nothing to eat or drink after midnight the night prior to surgery.  ? Take their medications as prescribed unless otherwise directed by anesthesia team  ? Hold any aspirin (unless advised otherwise by anesthesia), ibuprofen, aleve, naprosyn, celebrex, and other nonsteroidal anti-inflammatory drugs, fish oil, vitamins and supplements for 7 days prior to surgery.  If no liver disease, it is OK to take acetaminophen, (Tylenol) as needed.    Caprice Renshaw,  PA-C          Best time to call patient and/or [place], and best contact ph# (RN please document outbound call):  Phone# [phone]          Reminder:  Symptom calls should be responded to within 2 hours by RN. If you need a more urgent callback to patient, please use RN chat group and/or page. Use messaging guidelines for red-flag symptoms. Please verify that your patient has received callback with-in the requested timeframe.

## 2020-08-25 NOTE — Telephone Encounter (Addendum)
There was an order to d/c foley catheter on 08/19/20 by NP K. [place]  Per documentation from IP team the foley catheter was d/c'd    RN called pt to clarify, no answer. Left pt a voicemail advising to call back to [employer].       Pt called back- his catheter is out but he has dribbling of urine.   Pt denies abd pain, min straining with urination, no bad smell. Any time pt stands up he has leaking urine. Denies bowel incontinence.     Pt will contact urology now and Will route to provider to review and advise further- pt is wondering if he should be on medication- from ortho vs urology.

## 2020-08-25 NOTE — Telephone Encounter (Signed)
Spoke with pt using two pt identifiers.    Offered for to have a RN visit for a PVR +/- catheter placement.He may have urinary retention and this is overflow incontinence, so I don't want to give him ditropan without knowing if his bladder can empty. Pt voiced understanding. Will call with any new or worsening symptoms.    States he needs at least three days to get a ride. Spoke with nurse supervisor and pt will come into clinic on Friday 08/28/20 at 9 am. Will route to front desk to assist with scheduling.

## 2020-08-25 NOTE — Telephone Encounter (Signed)
Left Vm to f/u with urology.

## 2020-08-25 NOTE — Telephone Encounter (Signed)
Patient had Spinal surgery last Tuesday and cath removed.  Since then he is having uncontrolled leakage/drinage.  He would like to be seen with MD or someone in our clinic.  He needs a 3 day notice for transportion issues.  Pt to be reached at [phone].  Thank you

## 2020-08-26 ENCOUNTER — Telehealth (HOSPITAL_BASED_OUTPATIENT_CLINIC_OR_DEPARTMENT_OTHER): Payer: Self-pay | Admitting: Orthopaedic Surgery of the Spine

## 2020-08-26 NOTE — Telephone Encounter (Addendum)
DOS: 08/18/20: Lumbar fusion    Returned call to patient-

## 2020-08-26 NOTE — Telephone Encounter (Signed)
Pt was discharge on Saturday. He states that he has had drainage on his lumbar wound. He wanted to wait until Friday to "drop of a picture." Difficulty understanding my request that his drainage is of concern. We needed pix today. He is at clinic and will have nurse help send. No fever.

## 2020-08-26 NOTE — Telephone Encounter (Signed)
Stephen [name] 69 year old   ESTABLISHED PAT ONLY     Caller's Name, Relationship to patient:  Stephen [name], Self         Reason for call (including the symptoms/concerns patient has):   Patient is concerned with the way his incision looks.    States he has some drainage. It's red and irritated.     He would like to send a picture and discuss with RN.       Current Pain Level (0 Lowest -10 highest) :           Provider Name:  Dr. Earnest Conroy       If  Patient had surgery: (Procedure Name) (DOS)  08/18/20  Procedure: Stage 1: Anterior lumbar interbody fusion, bone morphogenic protein, lumbar 5- sacral 1  Procedure: Stage 2: Lumbar decompression and fusions with instrumentation, allograft versus autograft, lumbar 5-sacral 1    LOV Date and Plan:  07/29/20  [facility]  H&P   Assessment  Stephen [name] is a 69 year old male with history as stated above presenting for preoperative evaluation prior to Stage 1: ALIF L5-S1 ; Stage 2 revision laminectomy and PSIF L3 thru S1 by Dr Zlomislic on 6/80/88.        PLAN   Anesthesia pre-op:  08/03/20   Pt has covid testing scheduled.  08/15/20   Vascular clearance for ALIF not completed.  Pt advised to make appt with vascular team.   PCP clearance completed per pt on 7/14.  Will request records.   Final clearance is made by anesthesia team on day of surgery   Per ortho department protocol, bactroban nasal prescribed twice daily for 5 days prior to surgery as MRSA prophylaxis   CHG given   Pt advised:  ? Nothing to eat or drink after midnight the night prior to surgery.  ? Take their medications as prescribed unless otherwise directed by anesthesia team  ? Hold any aspirin (unless advised otherwise by anesthesia), ibuprofen, aleve, naprosyn, celebrex, and other nonsteroidal anti-inflammatory drugs, fish oil, vitamins and supplements for 7 days prior to surgery.  If no liver disease, it is OK to take acetaminophen, (Tylenol) as needed.    Caprice Renshaw, PA-C  Orthopaedic Surgery  Physician Assistant  Supervising Physician - R Kenn File, MD, PhD          Best time to call patient and/or [place], and best contact ph# (RN please document outbound call):  Anytime, Phone# [phone]          Reminder:  Symptom calls should be responded to within 2 hours by RN. If you need a more urgent callback to patient, please use RN chat group and/or page. Use messaging guidelines for red-flag symptoms. Please verify that your patient has received callback with-in the requested timeframe.

## 2020-08-27 ENCOUNTER — Ambulatory Visit: Payer: Medicare Other | Attending: Nurse Practitioner | Admitting: Nurse Practitioner

## 2020-08-27 ENCOUNTER — Telehealth (HOSPITAL_BASED_OUTPATIENT_CLINIC_OR_DEPARTMENT_OTHER): Payer: Self-pay

## 2020-08-27 DIAGNOSIS — Z9889 Other specified postprocedural states: Secondary | ICD-10-CM | POA: Insufficient documentation

## 2020-08-27 DIAGNOSIS — Z09 Encounter for follow-up examination after completed treatment for conditions other than malignant neoplasm: Secondary | ICD-10-CM

## 2020-08-27 MED ORDER — DOXYCYCLINE HYCLATE 100 MG OR CAPS
100.0000 mg | ORAL_CAPSULE | Freq: Two times a day (BID) | ORAL | 0 refills | Status: DC
Start: 2020-08-27 — End: 2020-11-24

## 2020-08-27 NOTE — Telephone Encounter (Signed)
Patient returning Bell call. He will have his phone with him now. Please follow up with patient.

## 2020-08-27 NOTE — Telephone Encounter (Signed)
Left message for pt to CB ASAP-Increased drainage.

## 2020-08-27 NOTE — Telephone Encounter (Signed)
Patient is calling back to advise he can make it today.     Please confirm time of appt.       Thanks

## 2020-08-27 NOTE — Telephone Encounter (Addendum)
Transitional Telephonic Nurse (TTN) Post Discharge Manual Follow-Up Telephone Encounter   Inpatient Encounter CSN: [id number]  Admission Date: 2020-08-18  Discharge Date: 2020-08-22  Autocall Status: No Response  Call Date: 2020-08-23  Contact Number: [phone]  Primary Diagnosis: CHRONIC MIDLINE THORACIC BACK PAIN  Clinician: Dana Allan MSN, RN  Phone: [phone]   Email: [email or web address]   2020-08-27 10:30:51 - Pending Resolution; No Answer/Voicemail 1st Attempt;   Left message at above number; review of Epic shows patient with clinic appointment 08/27/20 at 12:30 today    2020-08-27 16:12:21 - Patient Not Reached; Encounter Closed

## 2020-08-27 NOTE — Telephone Encounter (Signed)
Spoke to pt and he can not make appt today. He does not think that he can have transportation. He will call back and confirm.     He will need to go to the ED due to increased drainage.

## 2020-08-27 NOTE — Progress Notes (Signed)
Spine Surgery Post Operative Visit  08/22/2020 Dr. Earnest Conroy  PROCEDURE PERFORMED:    1.  Laminectomy L4, partial laminectomies L5 and L3 with bilateral  facetectomies and foraminotomies L3-L4 and L4-L5.   2.  Maylene Roes posterior column osteotomies, L4-L5 and L3-L4.  3.  Decompression of cauda equina and exploration of nerve roots  bilateral L5, L4, L3.   4.  Posterior segmental spinal instrumentation and fusion L3 through  L5 with screws and rods (DePuy Synthes), local bone autograft, DBM  (Accell), beta tricalcium phosphate (MagnetOs).     Subjective: The patient returns today now approximately 5 days following the above surgery.  The patient c/o posterior incision redness and drainage. There drainage was present 2 days ago, it has stopped in the last few days. He feels that the brace is rubbing at the incision The patient rates his pain at 10 today. The patient denies problems with the incision or any fevers, chills or night sweats. He   is wearing the brace and tolerating it well.    Objective: Examination of the incision reveals some redness but no drainage from the incision.       Imaging: n/a  Impression: Patient doing well status post     Plan: We have again discussed post-operative instructions. Silver dressing to wound and will start antibiotics. With strict follow up in 1-2 weeks depending on the healing of the incision.   All of the patient's questions were answered. We will plan on seeing the patient back in the office in 1 weeks. If any problems develop prior to that time the patient will let us know and we can see him right away.

## 2020-08-27 NOTE — Interdisciplinary (Signed)
Procedure: Mepilex AG Border Dressing  and Extra Dressings Supplied      Location: Spine  Instruction/Education Provided: yes    Ordering Physician: Bilenkaya, Irina NP    Breg Product: No

## 2020-08-28 ENCOUNTER — Ambulatory Visit: Payer: Medicare Other | Attending: Urology

## 2020-08-28 ENCOUNTER — Telehealth (HOSPITAL_BASED_OUTPATIENT_CLINIC_OR_DEPARTMENT_OTHER): Payer: Self-pay | Admitting: Urology

## 2020-08-28 DIAGNOSIS — N320 Bladder-neck obstruction: Secondary | ICD-10-CM

## 2020-08-28 DIAGNOSIS — N3281 Overactive bladder: Secondary | ICD-10-CM

## 2020-08-28 MED ORDER — OXYBUTYNIN CHLORIDE 5 MG OR TABS
5.0000 mg | ORAL_TABLET | Freq: Three times a day (TID) | ORAL | 4 refills | Status: DC
Start: 2020-08-28 — End: 2021-04-29

## 2020-08-28 NOTE — Interdisciplinary (Addendum)
Patient came in ambulatory alert and oriented X 3. 2 patient's identifiers verified.   Pt here for PVR scan ordered by Dr. Lucia Bitter.   PVR scan 48 mL. Informed patient's Dr. Lucia Bitter plan. Discharge home and pt states understanding.

## 2020-08-28 NOTE — Op Note (Signed)
DATE OF SERVICE:  08/18/2020    PREOPERATIVE DIAGNOSIS:    1. Degenerative spondylosis and stenosis with radiculopathy.  2. Status post remote lumbar fusion L3 through L5 with adjacent  segment degeneration.   3. Status post stage I anterior lumbar interbody fusion L5-S1.    POSTOPERATIVE DIAGNOSIS:    1. Degenerative spondylosis and stenosis with radiculopathy.  2. Status post remote lumbar fusion L3 through L5 with adjacent  segment degeneration.   3. Status post stage I anterior lumbar interbody fusion L5-S1.    PROCEDURE PERFORMED:    1. Removal of retained instrumentation L3 through L5, consisting of  screws and rods (DePuy Synthes).   2. Exploration of fusion L3 through L5 (intact).  3. Revision laminectomy L5, partial laminectomy S1 with bilateral  facetectomies and foraminotomies L5-S1.   4. Decompression of cauda equina and exploration of nerve roots  bilateral S1, L5.   5. Posterior segmental spinal instrumentation and fusion L3 through  S1 with screws and rods (DePuy Synthes), local bone autograft, DBM  (Accell), rhBMP-2, beta tricalcium phosphate (MagnetOs).   6. Interpretation of intraoperative fluoroscopy.    SURGEON/STAFF:  Micai Apolinar, MD    ASSISTANT:  Verdon Cummins, MD.   Wellington Hampshire, MD.     Note, there was no qualified orthopedic surgery resident available to  assist with this case.     ANESTHESIA:  General.    FINDINGS:  Severe stenosis, prior fusion L3 through L5 intact.    WOUND CLASSIFICATION:  Class 1.    WOUND CLOSURE STATUS:  All layers closed.    SPECIMENS:  Retained instrumentation to Path for gross.    INTRAVENOUS FLUIDS:  2 L LR, 1 L albumin.    BLOOD PRODUCTS:  None.    ESTIMATED BLOOD LOSS:  350 cc.    URINE OUTPUT:  400 cc.    COMPLICATIONS:  None.    INDICATIONS:  Patient is a 69 year old male who has had progression  of low back pain and severe radiating bilateral lower extremity pain  with which extends from his gluteal region down the back of his legs  to his feet  and ankles.  He has a complex history with regard to his  lumbar spine, having previously undergone lumbar decompression and  fusion L3 through L5 about 1 year ago following which he did very  well with complete resolution of his complaints of low back pain and  radiculopathy at that time.  He had been at his baseline with return  to activities up until a few months ago, at which point he developed  recurrent onset of pain with complaints of worsening low back and  lower extremity radicular pain as previously noted.  Imaging  demonstrates evidence of marked interval progression of significant  degenerative disk disease and spondylosis at L5-S1 just caudal to the  prior instrumented fusion from L3 through L5 with associated severe  foraminal stenosis which does correlate with the distribution of his  symptoms.  He underwent extensive and exhaustive course of  conservative management including time, activity modification, pain  medication, physical therapy, and injections without any sustained  relief of his symptoms and with ongoing progression.  We discussed  options in detail.  He was interested in surgical address which we  explained would involve staged completion of anterior lumbar  interbody fusion L5-S1 with anterior retroperitoneal approach  followed then by revision laminectomy and posterior spinal  instrumentation and fusion L3 through S1.  We have discussed the  planned procedure previously including risks, benefits and  alternatives.  Risks include, but are not limited to infection,  bleeding, damage to nerves and vessels, dural tear, ongoing back pain  as well as leg pain and/or weakness and/or numbness and paresthesias,  hardware complications or failure, pseudarthrosis or nonunion,  additional procedures, as well as risk of general anesthesia  including but not limited to DVT, PE, stroke, myocardial infarction,  loss of life.  He expresses understanding and willingness to proceed  in the form of a  signed consent.     PROCEDURE NARRATIVE:  The patient was met in the preoperative holding  area where we again reviewed the planned procedure with him and  answered all of his questions.  Operative site was marked and he was  subsequently taken to the operating theater where he succumbed to  general endotracheal anesthesia and was placed first supine on a  regular table with all of his extremities and bony prominences  appropriately padded and protected.  He received appropriate  preoperative antibiotic prophylaxis.  He then underwent first stage  of surgery consisting of anterior lumbar interbody fusion L5-S1 with  anterior retroperitoneal approach.  For additional details, please  refer to separately dictated operative notes.  Upon completion of  first stage of surgery, he was deemed appropriate to proceed with  completion of second stage and as such was transferred from supine  and placed prone on a Jackson frame with all of his extremities and  bony prominences appropriately padded and protected once more.  Antibiotic prophylaxis was appropriately redosed.  The area over the  low back was prepped and draped in the usual sterile fashion.  Prior  to proceeding, we performed a formal preoperative verification and  time-out procedure once more, in which we confirmed patient identity  by name, number and birth date as well as the planned procedure and  availability of all necessary equipment.     A 15 blade scalpel was used to make a midline incision from L3  through S1 reopening a prior midline incision and we continued  dissection through superficial layers using electrocautery,  maintaining excellent hemostasis.  There was extensive scar present,  especially deep to the lumbodorsal fascia which was identified and  incised, and the deep paraspinal muscles were elevated in  subperiosteal fashion exposing the bony elements from L3 through S1  including the retained instrumentation from L3 through L5 which was  easily  identified.  Deep retractors were placed to facilitate  visualization.  We used vertebral body counts based on known  positioning of retained instrumentation, confirming intraoperative  levels.  We continued exposure again of bony elements from L3 through  S1.  At this point, we proceeded with removal of retained  instrumentation consisting of screws and rods from L3 through L5.  A  standard proprietary instrumentation was used to remove screws and  rods without difficulty and instrumentation was sent to Pathology for  gross.  We then proceeded with exploration of fusion.  Large Cobb  curettes were used to debride the posterolateral bony fusion mass  which appeared to be well incorporated.  We then used Epstein  curettes to segmentally stress L3-L4 as well as L4-L5 levels, which  demonstrated no evidence of movement in axial, coronal, or sagittal  plane, consistent with intact fusion.  At this point, we then  proceeded with placement of instrumentation.  7 x 50 mm pedicle  screws were placed at the L3, L4, and L5 levels without  difficulty in  sequential fashion (DePuy Synthes).  We then used standard visual  anatomic landmarks to identify planned starting point for pedicle  screw placement at the S1 pedicle.  High-speed matchstick bur was  used to create pilot hole and Gearshift pedicle finder was then used  to cannulate the pedicles bilaterally accessing the S1 vertebral  body.  Radiographic markers were placed.  AP and lateral fluoroscopic  imaging was obtained demonstrating appropriate positioning of planned  pedicle screw tracks at S1 as well as previously placed segmental  pedicle screw instrumentation from L3 through L5.  We then used a  4.35 mm tap to prepare screw tracts at S1 and positioned 7 x 40 mm  pedicle screws with excellent purchase.  Again, we obtained AP and  lateral fluoroscopic imaging demonstrating excellent positioning of  instrumentation.     At this point, we then proceeded with revision  lumbar decompression.  Large Cobb curettes were used to debride the posterolateral bony  fusion mass and we were then able to elevate epidural scar which was  confluent with the medial column of the posterolateral fusion.  We in  meticulous fashion proceeded with elevation and resection of epidural  scar tissue and at this point then proceeded with revision  laminectomy of L5 as well as partial laminectomy of S1.  Leksell  rongeur as well as Garment/textile technologist was used to resect the  spinous process as well as the residual lamina of L5.  Straight and  forward angle Epstein curettes were used to develop a plane between  ligamentum flavum and the overlying insertion on the undersurface of  the L5 lamina at the L5-S1 interlaminar space.  We then used  high-speed matchstick bur again to further thin out the residual  lamina of L5 as well as cephalad aspect of the S1 lamina.  3 and 4 mm  Kerrison rongeurs were then used to complete laminectomy first  centrally, which we widened bilaterally from pedicle to pedicle to  ensure wide decompression of the cauda equina.  After completing  revision laminectomy of L5, we then completed partial laminectomy of  the cephalad aspect of S1 in similar fashion with 3 and 4 mm Kerrison  rongeurs, first decompressed centrally which we then widened  bilaterally from pedicle to pedicle.  We then completed bilateral  facetectomies and foraminotomies, again with combination of  high-speed matchstick bur as well as 3 and 4 mm Kerrison rongeur at  L5-S1 in order to achieve thorough decompression of the exiting nerve  roots.  There was severe compression of the exiting nerve root and we  therefore proceeded in meticulous fashion with foraminotomies with 2  and 3 mm Kerrison rongeurs to resect the inferior articular process  of L5 as well as superior articular process of S1, ensuring wide  decompression of the exiting nerve roots, which was confirmed both  visually as well as with  palpation with a Surveyor, quantity.     At this point, we then proceeded with preparation of the  posterolateral fusion bed.  A high-speed matchstick bur was used to  decorticate the bony elements from L3 through the sacrum.  Local bone  autograft was then combined with demineralized bone matrix putty  (Accell) and rhBMP-2, and placed into the posterolateral fusion bed.  This was then reinforced with beta tricalcium phosphate (MagnetOs).  We then sized two 130 mm rods, which were contoured with a [place]  bender, to introduce appropriate lordosis.  Rods were then placed  without difficulty and set screws were positioned.  We proceeded with  final tightening with torque limiting screwdriver, compressing as we  final tightened between L5-S1 for restoration of segmental  parameters.  After this was complete, we obtained final AP and  lateral fluoroscopic imaging, again demonstrating excellent placement  of instrumentation with interval improvement of segmental parameters  at L5-S1 and restoration of foraminal volume and disk height.     We then proceeded with closure.  We confirmed excellent hemostasis.  A 10 mm flat drain was placed deep to the lumbodorsal fascia.  Fascia  was reapproximated with #1 Vicryl in figure-of-eight fashion.  Superficial layers were closed in layered fashion with 0 and 2-0  Vicryl.  Skin was closed with 3-0 Monocryl in running subcuticular  fashion.  Dermabond was placed as a skin sealant.  Sterile dressings  were placed.  The patient was then transferred to a regular bed and  extubated without difficulty.     DISPOSITION:  The patient returned to PACU in stable condition.  We  will continue mechanical DVT prophylaxis.  Mobilize with physical  therapy in LSO brace.  Continue to trend drain output with  prophylactic antibiotics while drain in place.       Job #:  [id number]

## 2020-08-28 NOTE — Telephone Encounter (Signed)
Pt came in today for PVR scan (48 mL)   Per message pt need PVR result prior starting Ditropan.     Pended order to Ditropan, routing to MD to sign if appropriate.

## 2020-08-30 NOTE — Discharge Summary (Signed)
Patient Name:  Stephen [name]    Principal Diagnosis (required):  Degenerative spondylosis and stenosis with radiculopathy, s/p remote lumbar fusion with adjacent segment degeneration    Hospital Problem List (required):    Patient Active Problem List   Diagnosis    Post-traumatic stricture of anterior urethra    Bladder neck contracture    Lumbar radiculopathy    Cervical radiculopathy    Incomplete bladder emptying    Lumbosacral spondylosis without myelopathy    Chronic SI joint pain    Degenerative spondylolisthesis    Lumbar stenosis with neurogenic claudication    Pre-procedure lab exam    Cervical spondylosis without myelopathy    Cervical spondylosis    S/P lumbar fusion    Chronic left SI joint pain    Chronic midline thoracic back pain    Spondylolisthesis at L5-S1 level       Additional Hospital Diagnoses ("rule out" or "suspected" diagnoses, etc.):  None         Principal Procedure During This Hospitalization (required):  Stage 1 (08/18/20):  1. Anterior retroperitoneal approach per vascular surgery  2. Anterior lumbar interbody fusion L5-S1 with PEEK cage (Depuy synthes), formagraft, rhBMP-2  3. Placement of biomechanical vertebral structural device consisting of PEEK cage  4. Anterior spinal instrumentation L5-S1 with anterior plate and screws (Depuy Synthes)  5. Interpretation of intraoperative fluoroscopy    Stage 2 (08/18/20):  1. Removal of retained instrumentation L3 thru L5 consisting of screws and rods (Depuy Synthes)  2. Exploration of fusion L3 thru L5  3. Revision laminectomy L5, partial laminectomy S1 with bilateral facetectomies and foraminotomies L5-S1  3. Decompression of cauda equina and exploration of nerve roots bilateral S1, L5  4. Posterior segmental spinal instrumentation and fusion L3 thru S1 with screws and rods (Depuy Synthes), local bone autograft, DBM (accel), rhBMP-2, beta tricalcium phosphate (magnetos)  5. Interpretation of intraoperative  fluoroscopy    Other Procedures Performed During This Hospitalization (required):  None    Consultations Obtained During This Hospitalization:  Vascular Surgery  PT  OT  CM    Key consultant recommendations:  Therapy regimen  Discharge disposition      Reason for Admission to the Hospital / History of Present Illness:  Per H&P note: 05/20/20  "Stephen [name] is a 69 year old male who presents to clinic for follow-up evaluation 8 months from the above procedure.  He continues to have low back pain and left buttocks pain.  He states the left lower extremity symptoms he was having prior to surgery have improved.  Reports a longstanding history of bilateral lower extremities feeling heavy like concrete.  He also reports bilateral calf burning pain that has been present for years.  He was evaluated by Dr. lane from vascular surgery who ordered a bilateral lower extremity ultrasound and referred him to lymphedema clinic where they recommended compression stockings.  He also underwent Leftsacroiliac steroid injection on 4/6 and LeftL5-S1transforminal epidural steroid injection on 4/21 both with Dr. Bridgett Larsson.  Neither injection provided any relief."    Hospital Course by Problem (required):  The patient was admitted and taken to the operating room for the planned procedure.  The procedure proceeded uneventfully and without complication - please see operative details for report.  Post-operatively the patient was admitted to the ward for recovery.  The post-operative plan  included the following:  - Antibiotics: ancef while drain in place  - acute post op anemia, asymptomatic  - DVT Prophylaxis: no pharmacologic  prophylaxis; SCDs/TED hose, ambulation  - Pain Medication: multimodal  - Diet: per vascular  - PT/OT:OOB with TLSO brace  - Post op imaging: upright XR pending  - Drain: continue  - Foley: DC today  - Other: aggressive incentive spirometry    On the day of discharge the patient was medically stable, urinating  without difficulty, tolerating regular oral diet, pain well controlled, and was cleared for discharge by PT/OT. The patient was given strict return precautions, wound care instructions, and activity restrictions and demonstrated clear understanding.      Patient will follow-up in 2 weeks in clinic.    Tests Outstanding at Discharge Requiring Follow Up:  None    Discharge Condition (required):  Stable    Key Physical Exam Findings at Discharge:  General: patient awake, alert, and responding to commands; no apparent distress  Cardio: regular rate and rhythm per pulse  Respiratory: patient breathing comfortably without use of accessory muscles  Neck/Back/Abdomen: incision c/d/i, dressing changed, Drain in place, output ss (140/60) - removed prior to discharge  Bilateral Lower Extremity              Motor:                                         Right           Left                   Iliopsoas (L2/3)                     5/5              5/5                            Quadriceps (L4)                    5/5              5/5                   Tibialis Anterior (L4/5)           5/5             5/5                   EHL (L5)                               5/5              5/5                   GSC (S1)                              5/5              5/5              Sensation:                   SILT in L2-S1 distributions.  Vascular Exam:                   Warm and well perfused distally    Discharge Diet:  Regular diet    Discharge Medications:     What To Do With Your Medications      START taking these medications      Add'l Info   docusate sodium 100 MG capsule  Commonly known as: COLACE  Take 2 capsules (200 mg) by mouth at bedtime.   Quantity: 15 capsule  Refills: 0     Geri-kot 8.6 MG tablet  Take 2 tablets (17.2 mg) by mouth every morning.  Generic drug: senna   Quantity: 15 tablet  Refills: 0     oxyCODONE 5 MG immediate release tablet  Commonly known as: ROXICODONE  Take 1 to 2 tablets by  mouth every 4 hours as needed for Moderate to Severe pain   Quantity: 60 tablet  Refills: 0        CHANGE how you take these medications      Add'l Info   acetaminophen 325 MG tablet  Commonly known as: TYLENOL  Take 3 tablets (975 mg) by mouth every 8 hours.   Quantity: 130 tablet  Refills: 0  What changed:    how much to take   when to take this   reasons to take this        CONTINUE taking these medications      Add'l Info   Alaway 0.025 % ophthalmic solution  INSTILL 1 DROP INTO BOTH EYES TWICE A DAY AS DIRECTED  Generic drug: ketotifen   Refills: 0     allopurinol 300 MG tablet  Commonly known as: ZYLOPRIM  Take 300 mg by mouth daily.   Refills: 0     ciclopirox 0.77 % Gel  Commonly known as: LOPROX  Apply topically 2 times daily.   Refills: 0     Norvasc 5 MG tablet  Take 10 mg by mouth daily.  Generic drug: amLODIPINE   Refills: 0     omeprazole 20 MG capsule  Commonly known as: PRILOSEC  Take 20 mg by mouth daily.   Refills: 0     pregabalin 75 MG capsule  Commonly known as: LYRICA  Take 1 capsule (75 mg) by mouth 2 times daily.   Quantity: 60 capsule  Refills: 2     SYSTANE ULTRA OP   Refills: 0     triamcinolone 0.1 % cream  Commonly known as: KENALOG  Apply 1 Application topically 2 times daily. Apply a thin layer as directed   Refills: 0     VITAMIN D PO  Take 500 mg by mouth daily.   Refills: 0     zolpidem 5 MG tablet  Commonly known as: AMBIEN  Take 1 tablet (5 mg) by mouth nightly as needed for Insomnia.   Quantity: 30 tablet  Refills: 0     ZYRTEC PO  10 mg.   Refills: 0        STOP taking these medications    chlorhexidine 4 % liquid  Commonly known as: HIBICLENS     mupirocin 2 % ointment  Commonly known as: BACTROBAN           Where to Get Your Medications      These medications were sent to [id number] - [place], [place]  [address], [place] [address]    Hours: Mon-Fri  8am-9pm, Sat 9am-6pm, Sun 10am-6pm Phone: [phone]    pregabalin 75 MG capsule   zolpidem 5  MG tablet     These medications were sent to [id number], [place] [address]    Hours: Mon-Fri: 8:30am-7:00pm; Sat-Sun & Holidays: 9:00am-5:00pm Phone: [phone]    acetaminophen 325 MG tablet   docusate sodium 100 MG capsule   Geri-kot 8.6 MG tablet   oxyCODONE 5 MG immediate release tablet         Allergies:  Amoxicillin and Sulfa drugs    Discharge Disposition:  Home with home care services:  home physical therapy.    Discharge Code Status:  Full code / full care  This code status is not changed from the time of admission.    Follow Up Appointments:    Scheduled appointments:  Future Appointments   Date Time Provider [place]   09/10/2020 10:00 AM Sheria Lang, NP [facility]       For appointments requested for after discharge that have not yet been scheduled, refer to the Post Discharge Referrals section of the After Visit Summary.    Discharging 30 Contact Information:  [facility] operator at [phone].

## 2020-08-31 NOTE — Telephone Encounter (Signed)
Left message to patient's answering machine  Informing that the medication Ditropan has been approved and [facility].

## 2020-08-31 NOTE — Telephone Encounter (Signed)
Incoming call from patient, confirming medication was picked up and he is taking it, no further action needed.

## 2020-09-03 ENCOUNTER — Other Ambulatory Visit (HOSPITAL_BASED_OUTPATIENT_CLINIC_OR_DEPARTMENT_OTHER): Payer: Self-pay | Admitting: Orthopaedic Surgery of the Spine

## 2020-09-03 DIAGNOSIS — R52 Pain, unspecified: Secondary | ICD-10-CM

## 2020-09-03 DIAGNOSIS — Z981 Arthrodesis status: Secondary | ICD-10-CM

## 2020-09-03 MED ORDER — OXYCODONE HCL 5 MG OR TABS
5.0000 mg | ORAL_TABLET | ORAL | 0 refills | Status: DC | PRN
Start: 2020-09-03 — End: 2020-09-14

## 2020-09-03 NOTE — Telephone Encounter (Signed)
Prescription Refill Request -     Please update preferred Pharmacy in patient Registration prior to sending a message, if the pharmacy is not on file. This step is crucial, patients may want a prescription filled at a different pharmacy each time and we need to assure that we have that information in our system.          Preferred Pharmacy for e-prescription ([id number] -  [place], [place] - [address]        Preferred clinic pick-up location or desires medication to be sent electronically to pharmacy if possible?        What order is the Preferred Pharmacy for this prescription in registration (1,2,3) ?  1        Medication:                   oxyCODONE (ROXICODONE) 5 MG immediate release tablet (Order# [id number])        Date of last refill: 08/22/2020      How many Pills/Tablets does the patient have left? 2 tablets       Name of original prescribing physician: Dr. Maylene Roes      DOS: 08/18/2020      LOV notes and plan: 08/27/2020    Plan: We have again discussed post-operative instructions. Silver dressing to wound and will start antibiotics. With strict follow up in 1-2 weeks depending on the healing of the incision.   All of the patient's questions were answered. We will plan on seeing the patient back in the office in 1 weeks. If any problems develop prior to that time the patient will let us know and we can see him right away        Best callback phone number and best time: [phone]          Department Policy: We are only able to prescribe medications for patients that are current established patients (seen within 6 months, unless their notes state return in a year).       Please allow up to up to 72 hours for our office to process your medications request    Please verify any new medication allergies with patient, Please review chart to see notes from last refill request if patient has obtained previous refills of same medication.

## 2020-09-03 NOTE — Telephone Encounter (Signed)
Routing to Karolee Ohs NP for pain med refill approval if approprriate.  Pt is still with pain on his left lower buttock area.  Oxy helps ease the pain.    RTC 09/10/20  08/18/20 Status post lumbar spine surgery for decompression of spinal cord  =====================================================  Per pt's report , he has this unresolved left lower buttock burning-shooting pain 8/10 with every leg movement he makes, when pt takes pan med Oxy, pain drops to @ least 4/10.Requesting refill till his pain is controlled.    Takes 1 tab Oxy q 4 hours with minimal relief.  Pt seldom takes 2 tabs,

## 2020-09-07 ENCOUNTER — Telehealth (HOSPITAL_BASED_OUTPATIENT_CLINIC_OR_DEPARTMENT_OTHER): Payer: Self-pay | Admitting: Orthopaedic Surgery of the Spine

## 2020-09-07 DIAGNOSIS — Z981 Arthrodesis status: Secondary | ICD-10-CM

## 2020-09-07 DIAGNOSIS — K5900 Constipation, unspecified: Secondary | ICD-10-CM

## 2020-09-07 MED ORDER — SENNA 8.6 MG OR TABS
17.2000 mg | ORAL_TABLET | Freq: Every morning | ORAL | 0 refills | Status: DC
Start: 2020-09-07 — End: 2020-09-23

## 2020-09-07 MED ORDER — DOCUSATE SODIUM 100 MG OR CAPS
200.0000 mg | ORAL_CAPSULE | Freq: Every evening | ORAL | 0 refills | Status: DC
Start: 2020-09-07 — End: 2020-09-23

## 2020-09-07 NOTE — Telephone Encounter (Signed)
Stephen [name] 69 year old   ESTABLISHED PAT ONLY     Caller's Name, Relationship to patient:          Reason for call (including the symptoms/concerns patient has):     Patient is calling to reports burning in his back down R leg. Patient states the pain is bad the morning. Also requesting Rx refill for senna (SENOKOT) 8.6 MG tablet and docusate sodium (COLACE) 100 MG capsule .    [id number] - [place], [place] - [address]      Current Pain Level (0 Lowest -10 highest) : 10          Provider Name: Dr Earnest Conroy        If  Patient had surgery:     DATE OF SERVICE:  08/18/2020    PREOPERATIVE DIAGNOSIS:  Lumbar degenerative spine disease, level  L5-S1.     POSTOPERATIVE DIAGNOSIS:  Lumbar degenerative spine disease, level  L5-S1.     PROCEDURE PERFORMED:    1. Anterior exposure for lumbar interbody fusion levels L5-S1.  2. Supervision and interpretation of intraoperative fluoroscopy.  3. Placement of amniotic tissue 4 x 4 square cm, level L5-S1.    LOV Date and Plan:    Plan: We have again discussed post-operative instructions. Silver dressing to wound and will start antibiotics. With strict follow up in 1-2 weeks depending on the healing of the incision.   All of the patient's questions were answered. We will plan on seeing the patient back in the office in 1 weeks. If any problems develop prior to that time the patient will let us know and we can see him right away.        Best time to call patient and/or [place], and best contact ph# (RN please document outbound call):            Reminder:  Symptom calls should be responded to within 2 hours by RN. If you need a more urgent callback to patient, please use RN chat group and/or page. Use messaging guidelines for red-flag symptoms. Please verify that your patient has received callback with-in the requested timeframe.

## 2020-09-07 NOTE — Telephone Encounter (Signed)
Sent to provider 

## 2020-09-08 ENCOUNTER — Other Ambulatory Visit (INDEPENDENT_AMBULATORY_CARE_PROVIDER_SITE_OTHER): Payer: Medicare Other

## 2020-09-08 ENCOUNTER — Other Ambulatory Visit: Payer: Managed Care, Other (non HMO)

## 2020-09-08 ENCOUNTER — Other Ambulatory Visit: Payer: Self-pay

## 2020-09-08 ENCOUNTER — Encounter (HOSPITAL_BASED_OUTPATIENT_CLINIC_OR_DEPARTMENT_OTHER): Payer: Self-pay | Admitting: Nurse Practitioner

## 2020-09-08 DIAGNOSIS — R748 Abnormal levels of other serum enzymes: Secondary | ICD-10-CM | POA: Diagnosis not present

## 2020-09-08 DIAGNOSIS — Z981 Arthrodesis status: Secondary | ICD-10-CM

## 2020-09-08 LAB — HEPATIC FUNCTION PANEL
ALT: 22 U/L (ref 0–53)
AST: 21 U/L (ref 0–37)
Albumin: 3.8 g/dL (ref 3.5–5.2)
Alkaline Phosphatase: 106 U/L (ref 39–117)
Bilirubin, Direct: 0.1 mg/dL (ref 0.0–0.3)
Total Bilirubin: 0.6 mg/dL (ref 0.2–1.2)
Total Protein: 6.8 g/dL (ref 6.0–8.3)

## 2020-09-09 ENCOUNTER — Other Ambulatory Visit: Payer: Self-pay

## 2020-09-10 ENCOUNTER — Ambulatory Visit
Admission: RE | Admit: 2020-09-10 | Discharge: 2020-09-10 | Disposition: A | Discharge status: Home / Self Care | Payer: Medicare Other | Attending: Nurse Practitioner | Admitting: Nurse Practitioner

## 2020-09-10 ENCOUNTER — Encounter (HOSPITAL_BASED_OUTPATIENT_CLINIC_OR_DEPARTMENT_OTHER): Payer: Self-pay | Admitting: Nurse Practitioner

## 2020-09-10 ENCOUNTER — Ambulatory Visit (HOSPITAL_BASED_OUTPATIENT_CLINIC_OR_DEPARTMENT_OTHER): Payer: Medicare Other | Admitting: Nurse Practitioner

## 2020-09-10 VITALS — BP 99/69 | HR 81 | Temp 99.2°F | Resp 18 | Ht 67.87 in | Wt 208.8 lb

## 2020-09-10 DIAGNOSIS — Z981 Arthrodesis status: Secondary | ICD-10-CM

## 2020-09-10 DIAGNOSIS — M5136 Other intervertebral disc degeneration, lumbar region: Secondary | ICD-10-CM

## 2020-09-10 DIAGNOSIS — Z09 Encounter for follow-up examination after completed treatment for conditions other than malignant neoplasm: Secondary | ICD-10-CM

## 2020-09-10 NOTE — Addendum Note (Signed)
Addended by: Renato Gails on: 09/10/2020 11:02 AM     Modules accepted: Orders

## 2020-09-10 NOTE — Progress Notes (Signed)
Spine Surgery Post Operative Visit  08/22/2020 Dr. Earnest Conroy  PROCEDURE PERFORMED:    1.  Laminectomy L4, partial laminectomies L5 and L3 with bilateral  facetectomies and foraminotomies L3-L4 and L4-L5.   2.  Maylene Roes posterior column osteotomies, L4-L5 and L3-L4.  3.  Decompression of cauda equina and exploration of nerve roots  bilateral L5, L4, L3.   4.  Posterior segmental spinal instrumentation and fusion L3 through  L5 with screws and rods (DePuy Synthes), local bone autograft, DBM  (Accell), beta tricalcium phosphate (MagnetOs).     Subjective: The patient returns today now approximately 5 days following the above surgery.  The patient c/o posterior incision redness and drainage. There drainage was present 2 days ago, it has stopped in the last few days. He feels that the brace is rubbing at the incision The patient rates his pain at 10 today. The patient denies problems with the incision or any fevers, chills or night sweats. He   is wearing the brace and tolerating it well.    INterval History: wound healing improved. No breakdown.. using silver dressings    Objective: Examination of the incision reveals improved redness, there is no breakdown in incision  Imaging: n/a  Impression: Patient doing well status post above surgery, pain improved.     Plan: We have again discussed post-operative instructions. Silver dressing to wound and will start antibiotics.  F/u in 4 weeks.    All of the patient's questions were answered. We will plan on seeing the patient back in the office in 1 weeks. If any problems develop prior to that time the patient will let us know and we can see him right away.

## 2020-09-10 NOTE — Interdisciplinary (Signed)
Mepitel AG applied to lumbar incison, one extra dressing provided per Renato Gails NP

## 2020-09-10 NOTE — Patient Instructions (Signed)
Home health RN will change dressing every 3-5 days additional 2 times. Then discontinue

## 2020-09-13 ENCOUNTER — Encounter (HOSPITAL_BASED_OUTPATIENT_CLINIC_OR_DEPARTMENT_OTHER): Payer: Self-pay | Admitting: Nurse Practitioner

## 2020-09-14 ENCOUNTER — Other Ambulatory Visit (HOSPITAL_BASED_OUTPATIENT_CLINIC_OR_DEPARTMENT_OTHER): Payer: Self-pay | Admitting: Orthopaedic Surgery of the Spine

## 2020-09-14 DIAGNOSIS — R52 Pain, unspecified: Secondary | ICD-10-CM

## 2020-09-14 DIAGNOSIS — Z981 Arthrodesis status: Secondary | ICD-10-CM

## 2020-09-14 MED ORDER — OXYCODONE HCL 5 MG OR TABS
5.0000 mg | ORAL_TABLET | ORAL | 0 refills | Status: DC | PRN
Start: 2020-09-14 — End: 2020-09-21

## 2020-09-14 NOTE — Telephone Encounter (Signed)
DOS 08/18/20: L/ S fusion Dr. Earnest Conroy   refill sent to provider.

## 2020-09-14 NOTE — Telephone Encounter (Signed)
From: Lovena Le  To: Sheria Lang, NP  Sent: 09/13/2020 4:32 PM PDT  Subject: 20-Other    Thank you for working with me I am out of my pain med hope you can get me a refill

## 2020-09-14 NOTE — Telephone Encounter (Signed)
Prescription Refill Request -     Please update preferred Pharmacy in patient Registration prior to sending a message, if the pharmacy is not on file. This step is crucial, patients may want a prescription filled at a different pharmacy each time and we need to assure that we have that information in our system.          Preferred Pharmacy for e-prescription [employer] insurance will not be approved [facility]. [facility] and [facility] will approve):        Preferred clinic pick-up location or desires medication to be sent electronically to pharmacy if possible?        What order is the Preferred Pharmacy for this prescription in registration (1,2,3) ? [id number] - [place], [place]   [address], [place] [address]   Phone:  [phone] Fax:  [phone]   DEA #:  [id number]        Medication: oxyCODONE (ROXICODONE) 5 MG immediate release tablet                           Date of last refill: 09/03/2020      How many Pills/Tablets does the patient have left? none      Name of original prescribing physician:    Cecile Hearing, NP           DOS: [place] INTERBODY FUSION 8/31.          LOV notes and plan: Plan: We have again discussed post-operative instructions. Silver dressing to wound and will start antibiotics.  F/u in 4 weeks.    All of the patient's questions were answered. We will plan on seeing the patient back in the office in 1 weeks. If any problems develop prior to that time the patient will let us know and we can see him right away.        Best callback phone number and best time:  [phone]          Department Policy: We are only able to prescribe medications for patients that are current established patients (seen within 6 months, unless their notes state return in a year).       Please allow up to up to 72 hours for our office to process your medications request    Please verify any new medication allergies with patient, Please  review chart to see notes from last refill request if patient has obtained previous refills of same medication.

## 2020-09-14 NOTE — Telephone Encounter (Signed)
Pt informed of pain med refill.

## 2020-09-15 LAB — FUNGAL CULTURE: Fungus Culture Result: NO GROWTH

## 2020-09-21 ENCOUNTER — Emergency Department
Admission: EM | Admit: 2020-09-21 | Discharge: 2020-09-21 | Disposition: A | Discharge status: Home / Self Care | Payer: Medicare Other | Attending: Emergency Medicine | Admitting: Emergency Medicine

## 2020-09-21 ENCOUNTER — Emergency Department (HOSPITAL_COMMUNITY): Payer: Medicare Other

## 2020-09-21 ENCOUNTER — Telehealth (HOSPITAL_BASED_OUTPATIENT_CLINIC_OR_DEPARTMENT_OTHER): Payer: Self-pay | Admitting: Nurse Practitioner

## 2020-09-21 DIAGNOSIS — W19XXXA Unspecified fall, initial encounter: Secondary | ICD-10-CM

## 2020-09-21 DIAGNOSIS — R52 Pain, unspecified: Secondary | ICD-10-CM

## 2020-09-21 DIAGNOSIS — R32 Unspecified urinary incontinence: Secondary | ICD-10-CM | POA: Insufficient documentation

## 2020-09-21 DIAGNOSIS — Z87442 Personal history of urinary calculi: Secondary | ICD-10-CM | POA: Insufficient documentation

## 2020-09-21 DIAGNOSIS — Z882 Allergy status to sulfonamides status: Secondary | ICD-10-CM | POA: Insufficient documentation

## 2020-09-21 DIAGNOSIS — G8918 Other acute postprocedural pain: Secondary | ICD-10-CM | POA: Insufficient documentation

## 2020-09-21 DIAGNOSIS — Z043 Encounter for examination and observation following other accident: Secondary | ICD-10-CM

## 2020-09-21 DIAGNOSIS — Z9889 Other specified postprocedural states: Secondary | ICD-10-CM | POA: Insufficient documentation

## 2020-09-21 DIAGNOSIS — F329 Major depressive disorder, single episode, unspecified: Secondary | ICD-10-CM | POA: Insufficient documentation

## 2020-09-21 DIAGNOSIS — M79651 Pain in right thigh: Secondary | ICD-10-CM | POA: Insufficient documentation

## 2020-09-21 DIAGNOSIS — Z981 Arthrodesis status: Secondary | ICD-10-CM

## 2020-09-21 DIAGNOSIS — G8929 Other chronic pain: Secondary | ICD-10-CM

## 2020-09-21 DIAGNOSIS — Z880 Allergy status to penicillin: Secondary | ICD-10-CM | POA: Insufficient documentation

## 2020-09-21 DIAGNOSIS — M545 Low back pain, unspecified: Secondary | ICD-10-CM | POA: Insufficient documentation

## 2020-09-21 DIAGNOSIS — M79652 Pain in left thigh: Secondary | ICD-10-CM | POA: Insufficient documentation

## 2020-09-21 DIAGNOSIS — R5383 Other fatigue: Secondary | ICD-10-CM | POA: Insufficient documentation

## 2020-09-21 DIAGNOSIS — Z9049 Acquired absence of other specified parts of digestive tract: Secondary | ICD-10-CM | POA: Insufficient documentation

## 2020-09-21 DIAGNOSIS — Z79899 Other long term (current) drug therapy: Secondary | ICD-10-CM | POA: Insufficient documentation

## 2020-09-21 DIAGNOSIS — Z905 Acquired absence of kidney: Secondary | ICD-10-CM | POA: Insufficient documentation

## 2020-09-21 MED ORDER — OXYCODONE HCL 5 MG OR TABS
5.0000 mg | ORAL_TABLET | ORAL | 0 refills | Status: DC | PRN
Start: 2020-09-21 — End: 2020-10-21

## 2020-09-21 MED ORDER — ACETAMINOPHEN 325 MG PO TABS
975.0000 mg | ORAL_TABLET | Freq: Three times a day (TID) | ORAL | 0 refills | Status: DC
Start: 2020-09-21 — End: 2020-10-20

## 2020-09-21 MED ORDER — ACETAMINOPHEN 325 MG PO TABS
975.0000 mg | ORAL_TABLET | Freq: Once | ORAL | Status: AC
Start: 2020-09-21 — End: 2020-09-21
  Administered 2020-09-21 (×2): 975 mg via ORAL
  Filled 2020-09-21: qty 3

## 2020-09-21 NOTE — ED Notes (Signed)
Pt is A&O x4. Pt vss. Pt is able to amulate to lobby w/ steady gait. Pt breathing is equal and unlabored. Pt is able to speak in full sentences without difficulty. Pt cleared for discharge by ermd. Pt verbalizes understanding of plan of care and need for f/u with pcp.

## 2020-09-21 NOTE — Consults (Cosign Needed)
ORTHOPEDIC SPINE SURGERY CONSULT NOTE  10/09/2020    Consulted by: emergency department  Reason for Consult: post op pain w/ recent ground level fall    08/22/2020 Dr. Earnest Conroy  PROCEDURE PERFORMED:   1. Laminectomy L4, partial laminectomies L5 and L3 with bilateral  facetectomies and foraminotomies L3-L4 and L4-L5.   2. Maylene Roes posterior column osteotomies, L4-L5 and L3-L4.  3. Decompression of cauda equina and exploration of nerve roots  bilateral L5, L4, L3.   4. Posterior segmental spinal instrumentation and fusion L3 through  L5 with screws and rods (DePuy Synthes), local bone autograft, DBM  (Accell), beta tricalcium phosphate (MagnetOs).     HPI: 69 year old male s/p above surgery sent to ER by Renato Gails due GLF two days ago. Notes worsening raidcular leg pain and fatigue since surgery. Also ran out of oxycodone and was in need of refill. Endorses feeling generally weak. Endorses low back pain and anterior thigh pain bilaterally. Has history of urinary incontinence, which has been stable and unchanged. No new bowel incontinence. No paresthesias. Remains ambulatory with cane. Was last seen in clinic 09/10/20 by Renato Gails, who prescribed patient course of oral antibiotics due to concerns for incisional erythema and drainage. Patient completed antibiotics and has been undergoing weekly silver mepilex dressing changes with interval improvement in incision. Denies fever or chills. Has been compliant with his brace. Has follow-up scheduled with Renato Gails 10/05/20.    PMH/PSH:  Past Medical History:   Diagnosis Date    Chronic suprapubic catheter (CMS-HCC)     Congenital hydronephrosis     Gout     Headache     Hematuria     Kidney disease     Kidney stones     Major depressive disorder, single episode     Polyarthropathy or polyarthritis of multiple sites     Retinal detachment     Urethral stricture      Past Surgical History:   Procedure Laterality Date    CT INSERTION OF  SUPRAPUBIC CATH  09/25/2015    NEPHRECTOMY Right 1995    APPENDECTOMY      COLONOSCOPY      CYSTOSCOPY      CYSTOSCOPY W/ LASER LITHOTRIPSY      OTHER SURGICAL HISTORY      Interstim 01/29/2011    TRANSURETHRAL RESECTION OF PROSTATE          Medications:  No current facility-administered medications for this encounter.     Current Outpatient Medications   Medication Sig    acetaminophen (TYLENOL) 325 MG tablet Take 3 tablets (975 mg) by mouth every 8 hours.    ALAWAY 0.025 % ophthalmic solution INSTILL 1 DROP INTO BOTH EYES TWICE A DAY AS DIRECTED    allopurinol (ZYLOPRIM) 300 MG tablet Take 300 mg by mouth daily.    amLODIPINE (NORVASC) 5 MG tablet Take 10 mg by mouth daily.    Cetirizine HCl (ZYRTEC PO) 10 mg.     ciclopirox (LOPROX) 0.77 % GEL Apply topically 2 times daily.    docusate sodium (COLACE) 100 MG capsule Take 2 capsules (200 mg) by mouth at bedtime.    doxyCYCLINE (VIBRAMYCIN) 100 MG capsule Take 1 capsule (100 mg) by mouth 2 times daily.    omeprazole (PRILOSEC) 20 MG capsule Take 20 mg by mouth daily.    oxybutynin (DITROPAN) 5 MG tablet Take 1 tablet (5 mg) by mouth 3 times daily.    oxyCODONE (ROXICODONE) 5 MG immediate release tablet  Take 1 to 2 tablets by mouth every 4 hours as needed for Moderate to Severe pain    Polyethyl Glycol-Propyl Glycol (SYSTANE ULTRA OP)     pregabalin (LYRICA) 75 MG capsule Take 1 capsule (75 mg) by mouth 2 times daily.    senna (SENOKOT) 8.6 MG tablet Take 2 tablets (17.2 mg) by mouth every morning.    triamcinolone (KENALOG) 0.1 % cream Apply 1 Application topically 2 times daily. Apply a thin layer as directed    VITAMIN D PO Take 500 mg by mouth daily.     zolpidem (AMBIEN) 5 MG tablet Take 1 tablet (5 mg) by mouth nightly as needed for Insomnia.       Allergies:  Allergies   Allergen Reactions    Amoxicillin Rash    Sulfa Drugs Unspecified       Social History:  Smoking: denies  Alcohol: denies  Illicits: denies  Activity: independent  ambulator with use of cane  Occupation: retired  Living situation: lives in [place] History:  Non-contributory    Review of Systems:    Negative except as in HPI    Physical Exam:  BP 132/67    Pulse 56    Temp 98 F (36.7 C)    Resp 16    Ht 5\' 7"  (1.702 m)    Wt 91.7 kg (202 lb 2.6 oz)    SpO2 96%    BMI 31.66 kg/m   Wt Readings from Last 1 Encounters:   09/19/2020 91.7 kg (202 lb 2.6 oz)       General: patient awake, alert, and responding to commands; no apparent distress  HEENT: hearing and vision grossly intact  Cardio: regular rate and rhythm per peripheral pulses  Respiratory: patient breathing quietly without use of accessory muscles    Lumbar Spine Exam  General: incision c/d/i with minimal erythema and no drainage; no effusion, TTP, or step-offs noted at lumbar spine    Lower Extremity Motor Strength    Muscle Left/5 Right/5 Comment   Hip flexion (L2) 4+ 4+    Knee extension (L3) 5 5-    Tibialis anterior (L4) 5 5    Extensor hallucis (L5) 5 5    gastrocsoleus (S1) 5 5      Lower Extremity Sensation to Light Touch  Nerve Left Right Comment   L2 Intact Intact    L3 Intact Intact    L4 Intact Intact    L5 Intact Intact    S1 Intact Intact      Special Testing    Patellar Reflex:  3+ L, 2+ R  Achilles Reflex:  1+ L, 1+ R  Clonus    0 beats bilaterally  Straight Leg Raise  Neg bilaterally  Babinski   Neg bilaterally      Labs: no new labs    Imaging:  Radiographs demonstrate intact hardware and no evidence of complication.    Assessment:  69 year old male s/p above surgery sent to ER by Renato Gails due GLF two days ago. Please note patient elected to leave prior to staffing consult. He was agitated and eager to discharge upon my initial presentation. No acute hardware complication or failure noted on plain radiographs. Patient with continued pain in the post-operative setting and recently ran out of pain meds. Endorses generalized fatigue of unclear etiology. No UMN signs on exam and mild pain  dependent strength deficits in bilateral hip flexors.    Plan & Recommendations:  -  Patient elected to leave AMA prior to staffing consult and discussion with staff  - Follow-up scheduled with ortho spine NP 10/05/20    Please contact ortho on call with any questions or new concerns.    Case discussed with fellow on call, Dr. Maylene Roes.    Attending of record for this encounter is Dr. Lelon Huh.    Please page the Orthopaedic Spine Surgery Team with questions or concerns based on patient location:      Established spine floor patients at [place] [facility] [place] [facility]: Ortho Spine 1 - [phone]       Established spine floor patients at [facility]: Ortho Spine 2 - [phone]       New spine consultations not yet followed by spine team:    View [facility] on call for "SPINE CONSULT" call schedule (Orthopaedic Spine versus Neurosurgery)   If Orthopaedic Spine is on call please page the [place] on call:   [place]: ORTHOPEDICS/TH   [place]: ORTHOPEDICS/HC    Rachael Darby, MD  Resident Physician PGY-4  Department of Orthopaedic Surgery, [place]

## 2020-09-21 NOTE — Discharge Instructions (Signed)
Follow-up with your orthopedic spine physician in clinic.  Return to the emergency department if you are having urinary or bowel incontinence, P of severe pain not controlled with the medications prescribed, have severe weakness in your lower extremities and cannot walk

## 2020-09-21 NOTE — Telephone Encounter (Signed)
FYI, relayed below message.     Patient states he is walking into the ER now.

## 2020-09-21 NOTE — Telephone Encounter (Signed)
Stephen [name] 68 year old   ESTABLISHED PAT ONLY     Caller's Name, Relationship to patient: self patient          Reason for call thigh and leg pain. Patient is very weak. He is not able to sleep. He is very tired all the time. Pain level has increased. He is not getting and relieve from the pain medication.    He also reports he had a fall 10/01 because he tripped. Patient advice he didn't hit anything. He reports to be on a very small room.      Current Pain Level (0 Lowest -10 highest) : 10/10          Provider Name: Renato Gails        If  Patient had surgery: 08/18/2020 Stage 1: Anterior lumbar interbody fusion, bone morphogenic protein, lumbar 5- sacral 1          LOV Date and Plan:09/10/2020  Plan: We have again discussed post-operative instructions. Silver dressing to wound and will start antibiotics.  F/u in 4 weeks.    All of the patient's questions were answered. We will plan on seeing the patient back in the office in 1 weeks. If any problems develop prior to that time the patient will let us know and we can see him right away            Best time - [phone]              Reminder:  Symptom calls should be responded to within 2 hours by RN. If you need a more urgent callback to patient, please use RN chat group and/or page. Use messaging guidelines for red-flag symptoms. Please verify that your patient has received callback with-in the requested timeframe.

## 2020-09-21 NOTE — ED Notes (Signed)
Ortho at bedside.

## 2020-09-21 NOTE — Telephone Encounter (Signed)
Refills sent.  Also order placed for thoracolumbar xray.    If lyrica is helping, can add on a 25 mg to night dose.  Let me know if he feels it helps to increase it.

## 2020-09-21 NOTE — Telephone Encounter (Signed)
I have left provider message:   Refills sent. Also order placed for thoracolumbar xray.    If lyrica is helping, can add on a 25 mg to night dose.       Left message if he goes to the ED they will do xray.

## 2020-09-21 NOTE — Telephone Encounter (Addendum)
DOS 08/18/20: Lumbar fusion: Dr Earnest Conroy  LOV: 09/10/20 Gwenyth Ober, NP    Patient states that he is feeling overall weakness, arms, hands. Legs. He has pain levels at 10. "Most of my pain is in my back. "    He does not have a fever.   States that his wound is not draining or red. He is taking 1 tablet of Oxycodone "every 4-6 hours and I am trying to stop."   He is not taking tylenol.   He is taking lyrica 75mg  BID.    Long conversation about his "overall weakness. I have advised to go to the ED but he does not want too. I have discussed with [place].   He will take Oxycodone 10mg  very 4 hurs ATC for the next 48 hours. Will need a refill.     I discussed this with him and then again he states, "Well I don't know that I need this."  I explained that you are calling us with a pain level of 10. He agrees to this.     He will start tylenol again.    At the end of our conversation he states that he fell on 10/2. It is not clear if he fell to his back but states, "I rolled over a few times."   I explained that whenever he has had spine surgery and then falls, he will need to go to the ED for a complete assessment. He agrees to this.   I have changed the f/u appt with Irina to 10/18.

## 2020-09-21 NOTE — ED Notes (Signed)
Dr. Stone at bedside

## 2020-09-21 NOTE — ED Provider Notes (Signed)
Emergency Department Note  [facility] electronic medical record reviewed for pertinent medical history.     Patient: Stephen [name], MRN [id number], DOB [birth year]  The Date of Service for the Emergency Room encounter is 09/21/2020  3:36 PM     Nursing Triage Note:   Chief Complaint   Patient presents with    Back Pain     x 1 month after spinal surgery, c/o back pain and bilateral leg pain, per pt ran out of oxycode a couple of days ago.        History of Present Illness:   Arlee Santosuosso is a 69 year old male with PMHx spinal surgery (L3, 4, 5 laminectomies, decompression of cauda equina and exploration of nerve roots L3 through L5, posterior spinal instrumentation and fusion L3 through L5) on 08/22/2020 with Dr. Earnest Conroy. on chronic opioids who presents with low back pain in the setting of running out of his oxycodone.  Patient states that he has had left gluteal pain with associated bilateral thigh pain for approximately 3 weeks.  Patient recently ran out of his oxycodone and feels like his pain is not well controlled without that medication.  He had a recent fall 10/2 without back trauma where he tripped and fell onto his side.  Denies trauma consciousness.  Patient has not had worsened weakness or numbness to his lower extremities.  Has had mild urinary incontinence at baseline since the surgery which is improving over time.  No fecal incontinence.  No saddle anesthesia.  Denies fevers or chills or drainage from his back or abdominal wounds.  Called the orthopedic self online nurse-practitioner who refilled his oxycodone and recommended presentation to the emergency department for an x-ray of his lumbar spine.  He has a follow-up appointment with Orthopedics on 10/18.     Review of Systems:  All other systems reviewed and negative unless otherwise noted in the HPI.    Past Medical History:   Past Medical History:   Diagnosis Date    Chronic suprapubic catheter (CMS-HCC)     Congenital hydronephrosis     Gout      Headache     Hematuria     Kidney disease     Kidney stones     Major depressive disorder, single episode     Polyarthropathy or polyarthritis of multiple sites     Retinal detachment     Urethral stricture        Past Surgical History:   Past Surgical History:   Procedure Laterality Date    CT INSERTION OF SUPRAPUBIC CATH  09/25/2015    NEPHRECTOMY Right 1995    APPENDECTOMY      COLONOSCOPY      CYSTOSCOPY      CYSTOSCOPY W/ LASER LITHOTRIPSY      OTHER SURGICAL HISTORY      Interstim 01/29/2011    TRANSURETHRAL RESECTION OF PROSTATE         Family History:   Family History   Adopted: Yes   Family history unknown: Yes       Social History:  Social History     Tobacco Use    Smoking status: Never Smoker    Smokeless tobacco: Never Used   Substance Use Topics    Alcohol use: No    Drug use: Not Currently       Medications:   Prior to Admission Medications   Prescriptions Last Dose Informant Patient Reported? Taking?   ALAWAY 0.025 % ophthalmic solution  Yes No   Sig: INSTILL 1 DROP INTO BOTH EYES TWICE A DAY AS DIRECTED   Cetirizine HCl (ZYRTEC PO)   Yes No   Sig: 10 mg.    Polyethyl Glycol-Propyl Glycol (SYSTANE ULTRA OP)   Yes No   VITAMIN D PO   Yes No   Sig: Take 500 mg by mouth daily.    acetaminophen (TYLENOL) 325 MG tablet   No No   Sig: Take 3 tablets (975 mg) by mouth every 8 hours.   allopurinol (ZYLOPRIM) 300 MG tablet   Yes No   Sig: Take 300 mg by mouth daily.   amLODIPINE (NORVASC) 5 MG tablet   Yes No   Sig: Take 10 mg by mouth daily.   ciclopirox (LOPROX) 0.77 % GEL   Yes No   Sig: Apply topically 2 times daily.   docusate sodium (COLACE) 100 MG capsule   No No   Sig: Take 2 capsules (200 mg) by mouth at bedtime.   doxyCYCLINE (VIBRAMYCIN) 100 MG capsule   No No   Sig: Take 1 capsule (100 mg) by mouth 2 times daily.   omeprazole (PRILOSEC) 20 MG capsule   Yes No   Sig: Take 20 mg by mouth daily.   oxyCODONE (ROXICODONE) 5 MG immediate release tablet   No No   Sig: Take 1 to 2  tablets by mouth every 4 hours as needed for Moderate to Severe pain   oxybutynin (DITROPAN) 5 MG tablet   No No   Sig: Take 1 tablet (5 mg) by mouth 3 times daily.   pregabalin (LYRICA) 75 MG capsule   No No   Sig: Take 1 capsule (75 mg) by mouth 2 times daily.   senna (SENOKOT) 8.6 MG tablet   No No   Sig: Take 2 tablets (17.2 mg) by mouth every morning.   triamcinolone (KENALOG) 0.1 % cream   Yes No   Sig: Apply 1 Application topically 2 times daily. Apply a thin layer as directed   zolpidem (AMBIEN) 5 MG tablet   No No   Sig: Take 1 tablet (5 mg) by mouth nightly as needed for Insomnia.      Facility-Administered Medications: None       Allergies: Amoxicillin and Sulfa drugs      Physical Exam:  Vital signs reviewed and noted:   09/21/20  1158 09/21/20  1559 09/21/20  1621 09/21/20  1853   BP: 154/74  138/67 132/67   Pulse: 95  56 56   Resp: 16  17 16    Temp: 98 F (36.7 C)  98 F (36.7 C)    SpO2: 98% 98% 94% 96%     General: alert, no distress, well appearing, sitting comfortably in bed  Head: moist mucous membranes   Eyes: pupils round and reactive bilaterally   Neck: no lymphadenopathy   CV: RRR, no murmurs   Pulm: Breathing comfortably, clear to ascultation bilaterally  Abdomen: soft, non distended, non tender, midline surgical scar well approximated, healing, no surrounding erythema, her discharge, induration   Back: no spinal tenderness to palpations, midline surgical scar, healing appropriately, no areas of erythema or drainage , no fluctuance or induration, no palpable step off, no sign of trauma. Negative straight leg raise, mild L gluteal TTL  Lower extremities: No TTP along all bones in extremities  - ROM- full active & passive at hip, knee, ankle.    - SILT L1-S1  - 5 / 5 hip flexion BL;  -  5 / 5 knee extension BL;  - 5 / 5 plantarflexion BL;  - 5 / 5 dorsiflexion BL;  - 5 / 5 great toe extension BL;  - Distal pulses intact  - No edema  Neuro: Awake, alert, and oriented. Mentation appropriate.  Speech normal. No facial droop. Uvula midline. MAE. Ambulates with steady gait.   Psychiatric: Normal affect. Mood not labile nor depressed.   Skin: No rashes, lesions, or wounds.   Rectal: deferred    Orders:   Orders Placed This Encounter   Procedures    X-Ray Lumbosacral Spine 2 Or 3 Views     Medications   acetaminophen (TYLENOL) tablet 975 mg (975 mg Oral Given 09/21/20 1720)       Diagnostic Imaging Studies and Lab Results  X-Ray Lumbosacral Spine 2 Or 3 Views   Final Result   IMPRESSION:   No change from the prior radiograph on 09/10/2020. No acute osseous abnormality.      Redemonstration of posterior instrumented fixation and interbody fusions of L3-S1 without interval hardware complication or failure.           ED Course:   Workup Review as of Sep 22 1405   Agustina Caroli [facility] Documentation   Mon Sep 21, 2020   2131 Pt would like to leave      2131 XR no change      1909 Ortho wull fu      1903 Letitia Libra paged          Medical Decision Making:  Patient is a 69 year old male with lumbar spinal fusion on 8/31 who presented with chronic low back pain.  Patient is most likely presenting with chronic low back pain in the setting of running out of his oxycodone.  Recent fall reported patient does not have midline lumbar spinal tenderness to palpation so low suspicion for hardware fracture but cannot be ruled out so will obtain x-ray.  No new neurologic symptoms to suggest nerve compression.  No signs of cauda equina or cord compression.  No evidence of infection on exam or by vitals.  Patient's outpatient oxycodone was refilled and he has cooperative follow-up with [name].    X-ray of the lumbar spine reveals no changed from priors.  Orthopedics consulted given he has a recent surgical patient.  Final recommendations pending.  Patient requested discharge prior to official recommendations being provided fluid felt that he was comfortable with following up outpatient rather than staying for full  recommendations.    Diagnosis:  Chronic low back pain, post-surgical    Disposition:  Home with Ortho fu     I have discussed my thought process and plan for the patient with the attending physician.    This note was created using voice recognition technology.  Due to environmental circumstances and may contain grammatical errors, punctuation errors, and spelling errors.     Agustina Caroli, MD  [facility] Resident, Romie Levee, Charyl Dancer, MD  Resident  09/22/20 [address], [place], [place]  09/22/20 1408

## 2020-09-22 ENCOUNTER — Other Ambulatory Visit (HOSPITAL_BASED_OUTPATIENT_CLINIC_OR_DEPARTMENT_OTHER): Payer: Self-pay | Admitting: Nurse Practitioner

## 2020-09-22 DIAGNOSIS — Z981 Arthrodesis status: Secondary | ICD-10-CM

## 2020-09-22 DIAGNOSIS — G6289 Other specified polyneuropathies: Secondary | ICD-10-CM

## 2020-09-22 NOTE — Telephone Encounter (Signed)
It appears pt has 2 RFs.    Called [facility] at [phone]. Confirmed with Erlene Quan and he states he will fill the 75 mg capsule rx. Pt will have one remaining RF after this is filled.     Called pt and LVM that [facility] 75 [facility] him and that he has 1 remaining RF left. Encouraged pt to return call to clinic with any additional questions.

## 2020-09-22 NOTE — Telephone Encounter (Signed)
Patient requesting refill on the following medications    Prescription Refill Request -     Please update preferred Pharmacy in patient Registration prior to sending a message, if the pharmacy is not on file. This step is crucial, patients may want a prescription filled at a different pharmacy each time and we need to assure that we have that information in our system.          Preferred Pharmacy for e-prescription [employer] insurance will not be approved at [facility]. [facility] and [facility] will approve):        Preferred clinic pick-up location or desires medication to be sent electronically to pharmacy if possible?  [id number] [place] [place]      What order is the Preferred Pharmacy for this prescription in registration (1,2,3) ?  1    Medication: Lyrica 75 +25 -  Per nurse patient can up dosage patient is requesting this                        Date of last refill: 08/18/20      How many Pills/Tablets does the patient have left? 0      Name of original prescribing physician:Capozzi, Wandra Feinstein, NP      DOS: 07/19/20  Medication: docusate sodium (COLACE) 100 MG capsule     How many Pills/Tablets does the patient have left? 0    LOV notes and plan:  09/10/20  Plan: We have again discussed post-operative instructions. Silver dressing to wound and will start antibiotics.  F/u in 4 weeks.    All of the patient's questions were answered. We will plan on seeing the patient back in the office in 1 weeks. If any problems develop prior to that time the patient will let us know and we can see him right away.      Best callback phone number and best time:  [phone]        Department Policy: We are only able to prescribe medications for patients that are current established patients (seen within 6 months, unless their notes state return in a year).       Please allow up to up to 72 hours for our office to process your medications request    Please verify any new medication allergies with patient,  Please review chart to see notes from last refill request if patient has obtained previous refills of same medication.

## 2020-09-22 NOTE — ED Follow-up Note (Signed)
Follow-up type: Callback       Routine ED Patient Call Back    Patient unable to be contacted, no message left     Pia Jedlicka, MD  Emergency Medicine PGY-4

## 2020-09-23 ENCOUNTER — Other Ambulatory Visit (HOSPITAL_BASED_OUTPATIENT_CLINIC_OR_DEPARTMENT_OTHER): Payer: Self-pay | Admitting: Orthopaedic Surgery of the Spine

## 2020-09-23 ENCOUNTER — Telehealth (HOSPITAL_BASED_OUTPATIENT_CLINIC_OR_DEPARTMENT_OTHER): Payer: Self-pay | Admitting: Orthopaedic Surgery of the Spine

## 2020-09-23 DIAGNOSIS — Z981 Arthrodesis status: Secondary | ICD-10-CM

## 2020-09-23 DIAGNOSIS — K5900 Constipation, unspecified: Secondary | ICD-10-CM

## 2020-09-23 MED ORDER — SENNA 8.6 MG OR TABS
17.2000 mg | ORAL_TABLET | Freq: Every morning | ORAL | 0 refills | Status: DC
Start: 2020-09-23 — End: 2021-06-02

## 2020-09-23 MED ORDER — DOCUSATE SODIUM 100 MG OR CAPS
200.0000 mg | ORAL_CAPSULE | Freq: Every evening | ORAL | 0 refills | Status: DC
Start: 2020-09-23 — End: 2020-09-23

## 2020-09-23 MED ORDER — DOCUSATE SODIUM 100 MG OR CAPS
200.0000 mg | ORAL_CAPSULE | Freq: Every evening | ORAL | 0 refills | Status: DC
Start: 2020-09-23 — End: 2021-06-02

## 2020-09-23 NOTE — Telephone Encounter (Signed)
Lovena Le  requested callback for advice on:  Patient is requesting  all back from the RN to discuss his pain medication regimen.    He would like specific instructions on how to take the Tylenol and oxyCodone for optimal pain control.      Treating Physician:   Dr. Earnest Conroy     DOS:   08/18/20  Procedure: Stage 1: Anterior lumbar interbody fusion, bone morphogenic protein, lumbar 5- sacral 1  Procedure: Stage 2: Lumbar decompression and fusions with instrumentation, allograft versus autograft, lumbar 5-sacral 1    LOV notes/plan:   09/10/20  Impression: Patient doing well status post above surgery, pain improved.     Plan: We have again discussed post-operative instructions. Silver dressing to wound and will start antibiotics.  F/u in 4 weeks.    All of the patient's questions were answered. We will plan on seeing the patient back in the office in 1 weeks. If any problems develop prior to that time the patient will let us know and we can see him right away.    Best number to be reached at:  Ph# [phone]          Route to RN pool if requesting clinical advice, route to MA if administrative advice

## 2020-09-23 NOTE — Telephone Encounter (Signed)
Refill sent to provider

## 2020-09-23 NOTE — Telephone Encounter (Signed)
DOS: 08/18/20 L/S fusion    Spoke to pt about his pain meds. I have reviewed his Oxycodone. He went to ED on 10/4. They have continued his meds.     Will send to Provider for Lyrica.   He will need to have 100mg  every night. He already has 75mg  for am dosing.

## 2020-09-23 NOTE — Telephone Encounter (Signed)
Prescription Refill Request -     Please update preferred Pharmacy in patient Registration prior to sending a message, if the pharmacy is not on file. This step is crucial, patients may want a prescription filled at a different pharmacy each time and we need to assure that we have that information in our system.    Done.       Preferred Pharmacy for e-prescription [employer] insurance will not be approved [facility]. [facility] and [facility] will approve):  [id number] [place], [place]   [address], [place] [address]   Phone:  [phone] Fax:  [phone]   DEA #:  [id number]      Preferred clinic pick-up location or desires medication to be sent electronically to pharmacy if possible?  E-Prescribe       What order is the Preferred Pharmacy for this prescription in registration (1,2,3) ?  1      Medication:    docusate sodium (COLACE) 100 MG capsule (Order# [id number])    Date and Time: 09/07/2020 10:06 AM Department: [facility] Ordering/Authorizing: Cecile Hearing, NP                         senna (SENOKOT) 8.6 MG tablet (Order# [id number])    Date and Time: 09/07/2020 10:06 AM Department: [facility] Ordering/Authorizing: Cecile Hearing, NP       Date of last refill:   09/07/20    How many Pills/Tablets does the patient have left?      Name of original prescribing physician:  NP, Capozzi     DOS:  08/18/20    LOV notes and plan:  09/10/20  Impression: Patient doing well status post above surgery, pain improved.     Plan: We have again discussed post-operative instructions. Silver dressing to wound and will start antibiotics.  F/u in 4 weeks.    All of the patient's questions were answered. We will plan on seeing the patient back in the office in 1 weeks. If any problems develop prior to that time the patient will let us know and we can see him right away.        Best callback phone number and best time:  [phone]        Department Policy: We are  only able to prescribe medications for patients that are current established patients (seen within 6 months, unless their notes state return in a year).       Please allow up to up to 72 hours for our office to process your medications request    Please verify any new medication allergies with patient, Please review chart to see notes from last refill request if patient has obtained previous refills of same medication.

## 2020-09-29 ENCOUNTER — Other Ambulatory Visit: Payer: Self-pay

## 2020-09-30 ENCOUNTER — Encounter (HOSPITAL_BASED_OUTPATIENT_CLINIC_OR_DEPARTMENT_OTHER): Payer: Self-pay | Admitting: Nurse Practitioner

## 2020-09-30 MED ORDER — PREGABALIN 75 MG OR CAPS
ORAL_CAPSULE | ORAL | 0 refills | Status: DC
Start: 2020-09-30 — End: 2020-11-24

## 2020-09-30 MED ORDER — PREGABALIN 100 MG OR CAPS
ORAL_CAPSULE | ORAL | 0 refills | Status: DC
Start: 2020-09-30 — End: 2020-10-29

## 2020-09-30 NOTE — Progress Notes (Signed)
Eprecription sent:    lyrica 75 mg in am #30  lyrica 100 mg in pm #30

## 2020-09-30 NOTE — Telephone Encounter (Signed)
Pt is calling to f/u on previous encounter.

## 2020-09-30 NOTE — Telephone Encounter (Signed)
Refills sent to provider.

## 2020-10-01 NOTE — Telephone Encounter (Signed)
Left VM for pt: His Lyrica 100mg  is at pharmacy.

## 2020-10-01 NOTE — Telephone Encounter (Signed)
Called pt and LVM informing that requested prescriptions have been sent to pharmacy.

## 2020-10-05 ENCOUNTER — Ambulatory Visit (HOSPITAL_BASED_OUTPATIENT_CLINIC_OR_DEPARTMENT_OTHER): Payer: Medicare Other | Admitting: Nurse Practitioner

## 2020-10-05 ENCOUNTER — Ambulatory Visit
Admission: RE | Admit: 2020-10-05 | Discharge: 2020-10-05 | Disposition: A | Discharge status: Home / Self Care | Payer: Medicare Other | Attending: Nurse Practitioner | Admitting: Nurse Practitioner

## 2020-10-05 DIAGNOSIS — Z4789 Encounter for other orthopedic aftercare: Secondary | ICD-10-CM

## 2020-10-05 DIAGNOSIS — Z09 Encounter for follow-up examination after completed treatment for conditions other than malignant neoplasm: Secondary | ICD-10-CM

## 2020-10-05 DIAGNOSIS — W19XXXA Unspecified fall, initial encounter: Secondary | ICD-10-CM | POA: Insufficient documentation

## 2020-10-05 DIAGNOSIS — Z981 Arthrodesis status: Secondary | ICD-10-CM | POA: Insufficient documentation

## 2020-10-05 NOTE — Progress Notes (Signed)
Spine Surgery Post Operative Visit  08/22/2020 Dr. Earnest Conroy  PROCEDURE PERFORMED:    1.  Laminectomy L4, partial laminectomies L5 and L3 with bilateral  facetectomies and foraminotomies L3-L4 and L4-L5.   2.  Maylene Roes posterior column osteotomies, L4-L5 and L3-L4.  3.  Decompression of cauda equina and exploration of nerve roots  bilateral L5, L4, L3.   4.  Posterior segmental spinal instrumentation and fusion L3 through  L5 with screws and rods ([employer]ynthes), local bone autograft, DBM  ([employer]ell), beta tricalcium phosphate (MagnetOs).     Subjective:  He is now about 6 weeks after the surgery. The patient rates his pain at 9 today, however when he sits down his pain is at 4-5. The patient denies problems with the incision or any fevers, chills or night sweats. He   is wearing the brace and tolerating it well. He is c/o of new onset of diarrhea that has started in the last few weeks. He  Has had abdominal pain accompanying that. He has decreased appetite.     INterval History: wound healed.     Objective: Examination of the incision reveals improved redness, there is no breakdown in incision  Imaging:   IMPRESSION:    No acute fracture or dislocation.    Redemonstration of posterior instrumented fixation and interbody fusions from L3-S1 without interval hardware complication/failure. Spinal alignment is not significantly changed.    Other chronic findings, including mild multilevel degenerative disc disease above the level of instrumentation, not significantly changed.    Impression: Patient doing well status post above surgery, pain improved.     Plan:    F/u in 4 weeks.  Recommended patient sees his PCP for diarrhea work up. And he states he has an appointment in 2 days.  All of the patient's questions were answered. We will plan on seeing the patient back in the office in 4 weeks. If any problems develop prior to that time the patient will let us know and we can see him right away.

## 2020-10-08 ENCOUNTER — Ambulatory Visit (HOSPITAL_BASED_OUTPATIENT_CLINIC_OR_DEPARTMENT_OTHER): Payer: Medicare Other | Admitting: Nurse Practitioner

## 2020-10-18 ENCOUNTER — Other Ambulatory Visit: Payer: Self-pay | Admitting: Family

## 2020-10-19 ENCOUNTER — Telehealth (HOSPITAL_BASED_OUTPATIENT_CLINIC_OR_DEPARTMENT_OTHER): Payer: Self-pay | Admitting: Nurse Practitioner

## 2020-10-19 LAB — AFB CULTURE W/STAIN
AFB Culture Result: NO GROWTH
AFB Smear Result: NEGATIVE

## 2020-10-19 NOTE — Telephone Encounter (Signed)
..  Called patient and left voicemail about need to cancel and reschedule appointment with NP. Bilenkaya due to ooo. When patient calls back, please confirm rescheduled appointment to 12/7 at 9:30am.      Call center: Please add encounter note once patient has been rescheduled to indicate matter is resolved and edit appointment note if needed.

## 2020-10-20 ENCOUNTER — Encounter: Payer: Self-pay | Admitting: Cardiovascular Disease

## 2020-10-20 ENCOUNTER — Ambulatory Visit (INDEPENDENT_AMBULATORY_CARE_PROVIDER_SITE_OTHER): Payer: Medicare Other | Admitting: Cardiovascular Disease

## 2020-10-20 ENCOUNTER — Other Ambulatory Visit: Payer: Self-pay

## 2020-10-20 ENCOUNTER — Other Ambulatory Visit (HOSPITAL_BASED_OUTPATIENT_CLINIC_OR_DEPARTMENT_OTHER): Payer: Self-pay | Admitting: Hospital

## 2020-10-20 VITALS — BP 138/78 | HR 51 | Ht 68.0 in | Wt 218.5 lb

## 2020-10-20 DIAGNOSIS — I6523 Occlusion and stenosis of bilateral carotid arteries: Secondary | ICD-10-CM | POA: Diagnosis not present

## 2020-10-20 DIAGNOSIS — I351 Nonrheumatic aortic (valve) insufficiency: Secondary | ICD-10-CM | POA: Diagnosis not present

## 2020-10-20 DIAGNOSIS — I2510 Atherosclerotic heart disease of native coronary artery without angina pectoris: Secondary | ICD-10-CM

## 2020-10-20 DIAGNOSIS — I714 Abdominal aortic aneurysm, without rupture, unspecified: Secondary | ICD-10-CM

## 2020-10-20 DIAGNOSIS — Z981 Arthrodesis status: Secondary | ICD-10-CM

## 2020-10-20 DIAGNOSIS — R52 Pain, unspecified: Secondary | ICD-10-CM

## 2020-10-20 DIAGNOSIS — G4700 Insomnia, unspecified: Secondary | ICD-10-CM

## 2020-10-20 NOTE — Patient Instructions (Signed)
Medication Instructions:  Your physician recommends that you continue on your current medications as directed. Please refer to the Current Medication list given to you today.  *If you need a refill on your cardiac medications before your next appointment, please call your pharmacy*   Lab Work: None ordered If you have labs (blood work) drawn today and your tests are completely normal, you will receive your results only by: Marland Kitchen MyChart Message (if you have MyChart) OR . A paper copy in the mail If you have any lab test that is abnormal or we need to change your treatment, we will call you to review the results.   Testing/Procedures: Your physician has requested that you have an echocardiogram. Echocardiography is a painless test that uses sound waves to create images of your heart. It provides your doctor with information about the size and shape of your heart and how well your heart's chambers and valves are working. This procedure takes approximately one hour. There are no restrictions for this procedure. (To be scheduled in June 2022)   Follow-Up: At [facility], you and your health needs are our priority.  As part of our continuing mission to provide you with exceptional heart care, we have created designated Provider Care Teams.  These Care Teams include your primary Cardiologist (physician) and Advanced Practice Providers (APPs -  Physician Assistants and Nurse Practitioners) who all work together to provide you with the care you need, when you need it.  We recommend signing up for the patient portal called "MyChart".  Sign up information is provided on this After Visit Summary.  MyChart is used to connect with patients for Virtual Visits (Telemedicine).  Patients are able to view lab/test results, encounter notes, upcoming appointments, etc.  Non-urgent messages can be sent to your provider as well.   To learn more about what you can do with MyChart, go to [email or web address].     Your next appointment:   12 month(s)  The format for your next appointment:   In Person  Provider:   You may see Kathlyn Sacramento, MD or one of the following Advanced Practice Providers on your designated Care Team:    Murray Hodgkins, NP  Christell Faith, PA-C  Marrianne Mood, PA-C  Cadence Kathlen Mody, [place]    Other Instructions N/A

## 2020-10-20 NOTE — Progress Notes (Signed)
Cardiology Office Note   Date:  10/20/2020   ID:  Johnny Holland, DOB 12/20/1950, MRN [id number]  PCP:  Ria Bush, MD  Cardiologist:   Kathlyn Sacramento, MD   Chief Complaint  Patient presents with  . OTHER    6 month f/u c/o shoulder discomfort discuss midsternum. Meds reviewed verbally with pt.      History of Present Illness:And Johnny Holland is a 69 y.o. male who is here today for a follow-up visit regarding nonobstructive coronary artery disease and aortic valve disease.   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  Echocardiogram in April 2019 showed normal LV systolic function, grade 2 diastolic dysfunction, mildly calcified aortic valve without significant stenosis, mild to moderate aortic regurgitation and no evidence of pulmonary hypertension. Nuclear stress test in 2019 showed small apical defect felt to represent artifact with normal ejection fraction. Most recent echocardiogram in June 2020 showed an EF of 60 to 65%, moderately calcified aortic valve with mild aortic insufficiency and stenosis.    CTA of the coronary arteries done in August 2020 showed a calcium score of 580, nondominant RCA with 50% proximal stenosis and moderate proximal LAD stenosis.  No significant lesions by FFR.  He had myalgia with high-dose atorvastatin that resolved after decreasing the dose to 40 mg once daily.  He has been doing well with no recent chest pain, shortness of breath or palpitations.  He injured his right shoulder again and he is known to have rotator cuff injury.  He had abdominal aortic ultrasound done in March of this year which showed small infrarenal aortic aneurysm measuring 3.4 cm.  Past Medical History:  Diagnosis Date  . Aortic regurgitation 10/15/2012   Echocardiogram showing calcified aortic valves, mild AS, mod AR, preserved EF 55-65%, mild LV  dilation and LVH (02/2017). rec rpt 1 yr.   Marland Kitchen CAD (coronary artery disease) 01/2017   4v by CT  . Carotid stenosis 01/12/2016   Minimal on [name] (01/2016) f/u PRN   . COPD (chronic obstructive pulmonary disease) ([place]) 01/2017   mild paraseptal and centrilobular by CT  . Diverticulitis   . Diverticulosis of colon   . Duodenal ulcer   . Hepatic steatosis 01/2017   by CT  . History of smoking   . HLD (hyperlipidemia)   . Right shoulder pain    impingement with partial RTC tear ([place])  . Right-sided Bell's palsy 12/06/2018  . Thoracic aorta atherosclerosis ([place]) 01/2017   by CT    Past Surgical History:  Procedure Laterality Date  . CARDIOVASCULAR STRESS TEST  04/2018   low risk study, no ischemia  . COLONOSCOPY  11/30/06   diverticulosis; no polyps  . COLONOSCOPY WITH PROPOFOL N/A 04/13/2020   TA x2, divrticulosis, rpt 5 yrs (Vanga, Tally Due, MD)  . INCISE AND DRAIN ABCESS     Hemmorhoid  . VASECTOMY     x 2     Current Outpatient Medications  Medication Sig Dispense Refill  . albuterol (PROVENTIL HFA;VENTOLIN HFA) 108 (90 Base) MCG/ACT inhaler Inhale 2 puffs into the lungs every 6 (six) hours as needed for wheezing or shortness of breath. 1 Inhaler 0  . aspirin 81 MG tablet Take 1 tablet (81 mg total) by mouth daily.    Marland Kitchen atorvastatin (LIPITOR) 80 MG tablet TAKE 1/2 TABLET(40MG  TOTAL)DAILY 45 tablet 0  . Multiple Vitamin (MULTI-VITAMIN DAILY PO) Take by mouth.    Marland Kitchen  scopolamine (TRANSDERM-SCOP, 1.5 MG,) 1 MG/3DAYS Place 1 patch (1.5 mg total) onto the skin every 3 (three) days. (Patient taking differently: Place 1 patch onto the skin as needed. ) 4 patch 1  . sildenafil (VIAGRA) 100 MG tablet TAKE ONE-HALF TO ONE TABLET(50 TO 100MG  TOTAL) BY MOUTH DAILY AS NEEDED FOR ERECTILE DYSFUNCTION. GENERIC EQUIVALENT FOR VIAGRA 15 tablet 3   No current facility-administered medications for this visit.    Allergies:   Erythromycin    Social History:  The patient  reports that he  quit smoking about 9 years ago. He has a 42.00 pack-year smoking history. He has never used smokeless tobacco. He reports current alcohol use. He reports that he does not use drugs.   Family History:  The patient's family history includes Alcohol abuse in his father and sister; Alzheimer's disease in an other family member; Breast cancer in his maternal aunt; COPD in his mother; Cataracts in his mother; Cirrhosis in his father; Depression in his sister; Diabetes in his paternal grandmother; Emphysema in his father; Heart attack (age of onset: 11) in his father; Heart failure in his father; Macular degeneration in his mother.    ROS:  Please see the history of present illness.   Otherwise, review of systems are positive for none.   All other systems are reviewed and negative.    PHYSICAL EXAM: VS:  BP 138/78 (BP Location: Left Arm, Patient Position: Sitting, Cuff Size: Normal)   Pulse (!) 51   Ht 5\' 8"  (1.727 m)   Wt 218 lb 8 oz (99.1 kg)   SpO2 98%   BMI 33.22 kg/m  , BMI Body mass index is 33.22 kg/m. GEN: Well nourished, well developed, in no acute distress  HEENT: normal  Neck: no JVD, carotid bruits, or masses Cardiac: RRR; no  rubs, or gallops,no edema .  2 / 6 systolic ejection murmur in the aortic area which is early peaking with radiation to the carotid arteries Respiratory:  clear to auscultation bilaterally, normal work of breathing GI: soft, nontender, nondistended, + BS MS: no deformity or atrophy  Skin: warm and dry, no rash Neuro:  Strength and sensation are intact Psych: euthymic mood, full affect   EKG:  EKG is ordered today. The ekg ordered today demonstrates sinus bradycardia with left axis deviation and poor R wave progression in the anterior leads.  Recent Labs: 04/10/2020: BUN 12; Creatinine, Ser 1.06; Potassium 4.6; Sodium 141 09/08/2020: ALT 22    Lipid Panel    Component Value Date/Time   CHOL 141 04/10/2020 1127   TRIG 124 04/10/2020 1127   HDL 60  04/10/2020 1127   CHOLHDL 2.4 04/10/2020 1127   CHOLHDL 2 03/03/2020 0735   VLDL 18.8 03/03/2020 0735   LDLCALC 59 04/10/2020 1127   LDLDIRECT 65 04/10/2020 1127   LDLDIRECT 130.1 10/10/2011 0938      Wt Readings from Last 3 Encounters:  10/20/20 218 lb 8 oz (99.1 kg)  04/13/20 224 lb (101.6 kg)  04/10/20 233 lb 6 oz (105.9 kg)       PAD Screen 04/27/2018  Previous PAD dx? No  Previous surgical procedure? No  Pain with walking? No  Feet/toe relief with dangling? No  Painful, non-healing ulcers? No  Extremities discolored? No      ASSESSMENT AND PLAN:  1.  Coronary artery disease involving native coronary arteries without angina: The patient is known to have mild to moderate nonobstructive coronary artery disease on previous CTA.  Recommend continuing medical  therapy.  He is currently with no anginal symptoms.  Continue low-dose aspirin.    2.  Aortic valve disease: Most recent echocardiogram showed mild aortic stenosis and mild aortic insufficiency.  I requested a follow-up echocardiogram to be done in June of next year.  3.  Hyperlipidemia: He had myalgia with high-dose atorvastatin that resolved after decreasing the dose to 40 mg daily.  Most recent lipid profile showed an LDL of 59.  4.  Previous tobacco use: He quit.  5.  Small abdominal aortic aneurysm.  This measured 3.4 cm in March of this year.  The plan is to repeat duplex ultrasound in March 2023.  Disposition:   FU with me in 1 year  Signed,  Kathlyn Sacramento, MD  10/20/2020 1:49 PM    [place]

## 2020-10-21 ENCOUNTER — Other Ambulatory Visit (HOSPITAL_BASED_OUTPATIENT_CLINIC_OR_DEPARTMENT_OTHER): Payer: Self-pay

## 2020-10-21 DIAGNOSIS — Z981 Arthrodesis status: Secondary | ICD-10-CM

## 2020-10-21 DIAGNOSIS — R52 Pain, unspecified: Secondary | ICD-10-CM

## 2020-10-21 MED ORDER — ACETAMINOPHEN 325 MG PO TABS
975.0000 mg | ORAL_TABLET | Freq: Three times a day (TID) | ORAL | 0 refills | Status: DC
Start: 2020-10-21 — End: 2021-05-10

## 2020-10-21 MED ORDER — OXYCODONE HCL 5 MG OR TABS
5.0000 mg | ORAL_TABLET | ORAL | 0 refills | Status: DC | PRN
Start: 2020-10-21 — End: 2020-11-24

## 2020-10-21 NOTE — Telephone Encounter (Signed)
Left message for pt: We have refilled his tylenol. He will need to see PCP for Ambien refill.

## 2020-10-21 NOTE — Telephone Encounter (Addendum)
Prescription Refill Request -     Please update preferred Pharmacy in patient Registration prior to sending a message, if the pharmacy is not on file. This step is crucial, patients may want a prescription filled at a different pharmacy each time and we need to assure that we have that information in our system.          Preferred Pharmacy for e-prescription ([id number] - [place], [place] - [address]      Preferred clinic pick-up location or desires medication to be sent electronically to pharmacy if possible?        What order is the Preferred Pharmacy for this prescription in registration (1,2,3) ?  1      Medication:               oxyCODONE (ROXICODONE) 5 MG immediate release tablet (Order# [id number])      Date of last refill: 09/21/2020      How many Pills/Tablets does the patient have left? 2 tablets left      Name of original prescribing physician: : Cecile Hearing, NP      DOS: 08/18/2020      LOV notes and plan: 10/05/2020  Plan:    F/u in 4 weeks.  Recommended patient sees his PCP for diarrhea work up. And he states he has an appointment in 2 days.  All of the patient's questions were answered. We will plan on seeing the patient back in the office in 4 weeks. If any problems develop prior to that time the patient will let us know and we can see him right away.        Best callback phone number and best time: [phone]          Department Policy: We are only able to prescribe medications for patients that are current established patients (seen within 6 months, unless their notes state return in a year).       Please allow up to up to 72 hours for our office to process your medications request    Please verify any new medication allergies with patient, Please review chart to see notes from last refill request if patient has obtained previous refills of same medication.

## 2020-10-21 NOTE — Telephone Encounter (Signed)
DOS: 08/18/20  LOV:  10/05/20  LRD: 09/21/20    I will forward refill request to provider for review.

## 2020-10-21 NOTE — Addendum Note (Signed)
Addended byLoreli Dollar on: 10/21/2020 03:10 PM     Modules accepted: Orders

## 2020-10-21 NOTE — Telephone Encounter (Signed)
DOS 08/18/20. L/S fusion. Will send to provider for PCP for refill sleep med.   10/05/20  Irina, NP--rating pain at 9. Order for Oxycodone is still at 5mg  1-2 tabs every 4 hours prn.       Not on Gabapentin, muscle relaxants . Will send to provider to consider request for refills.

## 2020-10-21 NOTE — Addendum Note (Signed)
Addended by: Renato Gails on: 10/21/2020 03:12 PM     Modules accepted: Orders

## 2020-10-27 ENCOUNTER — Encounter: Payer: Self-pay | Admitting: Family Medicine

## 2020-10-29 ENCOUNTER — Other Ambulatory Visit (HOSPITAL_BASED_OUTPATIENT_CLINIC_OR_DEPARTMENT_OTHER): Payer: Self-pay | Admitting: Hospital

## 2020-10-29 DIAGNOSIS — Z981 Arthrodesis status: Secondary | ICD-10-CM

## 2020-10-29 NOTE — Telephone Encounter (Signed)
Please call pharmacy to clarify what is being requested.  Request may need to go to cardiology office if they ordered med.

## 2020-10-30 ENCOUNTER — Encounter: Payer: Self-pay | Admitting: Family

## 2020-10-30 MED ORDER — PREGABALIN 100 MG OR CAPS
ORAL_CAPSULE | ORAL | 0 refills | Status: DC
Start: 2020-10-30 — End: 2020-11-24

## 2020-10-30 MED ORDER — ATORVASTATIN CALCIUM 80 MG PO TABS: ORAL_TABLET | ORAL | 3 refills | Status: DC

## 2020-10-30 NOTE — Telephone Encounter (Signed)
Spoke with [facility] about rx.  They confirmed it came from [facility].    Spoke with pt informing him he would need to contact cards about refill.  Pt verbalizes understanding.

## 2020-11-16 ENCOUNTER — Ambulatory Visit (HOSPITAL_BASED_OUTPATIENT_CLINIC_OR_DEPARTMENT_OTHER): Payer: Medicare Other | Admitting: Nurse Practitioner

## 2020-11-18 DIAGNOSIS — U071 COVID-19: Secondary | ICD-10-CM

## 2020-11-18 HISTORY — DX: COVID-19: U07.1

## 2020-11-24 ENCOUNTER — Ambulatory Visit (HOSPITAL_BASED_OUTPATIENT_CLINIC_OR_DEPARTMENT_OTHER): Payer: Medicare Other | Admitting: Nurse Practitioner

## 2020-11-24 ENCOUNTER — Ambulatory Visit
Admission: RE | Admit: 2020-11-24 | Discharge: 2020-11-24 | Disposition: A | Discharge status: Home / Self Care | Payer: Medicare Other | Attending: Nurse Practitioner | Admitting: Nurse Practitioner

## 2020-11-24 ENCOUNTER — Encounter (HOSPITAL_BASED_OUTPATIENT_CLINIC_OR_DEPARTMENT_OTHER): Payer: Self-pay | Admitting: Nurse Practitioner

## 2020-11-24 VITALS — BP 119/64 | HR 58 | Temp 98.0°F | Resp 18 | Ht 67.0 in | Wt 202.2 lb

## 2020-11-24 DIAGNOSIS — M545 Low back pain, unspecified: Secondary | ICD-10-CM | POA: Insufficient documentation

## 2020-11-24 DIAGNOSIS — Z981 Arthrodesis status: Secondary | ICD-10-CM

## 2020-11-24 DIAGNOSIS — Z9889 Other specified postprocedural states: Secondary | ICD-10-CM

## 2020-11-24 DIAGNOSIS — Z4789 Encounter for other orthopedic aftercare: Secondary | ICD-10-CM

## 2020-11-24 MED ORDER — PREGABALIN 75 MG OR CAPS
ORAL_CAPSULE | ORAL | 0 refills | Status: DC
Start: 2020-11-24 — End: 2020-12-28

## 2020-11-24 MED ORDER — PREGABALIN 100 MG OR CAPS
ORAL_CAPSULE | ORAL | 0 refills | Status: DC
Start: 2020-11-24 — End: 2020-12-28

## 2020-11-24 NOTE — Progress Notes (Signed)
Spine Surgery Post Operative Visit  08/22/2020 Dr. Earnest Conroy  PROCEDURE PERFORMED:    1.  Laminectomy L4, partial laminectomies L5 and L3 with bilateral  facetectomies and foraminotomies L3-L4 and L4-L5.   2.  Maylene Roes posterior column osteotomies, L4-L5 and L3-L4.  3.  Decompression of cauda equina and exploration of nerve roots  bilateral L5, L4, L3.   4.  Posterior segmental spinal instrumentation and fusion L3 through  L5 with screws and rods (DePuy Synthes), local bone autograft, DBM  (Accell), beta tricalcium phosphate (MagnetOs).     Subjective:  He is now about 3 months after the surgery. The patient rates his pain at   today, however when he sits down his pain is at 4-5. The patient denies problems with the incision or any fevers, chills or night sweats. He   is wearing the brace and tolerating it well.  Feels that his walking improved.   Has some bilaiteral buttocks pain with sitting and when lays down in bed.   Objective: Examination of the incision reveals a healed incision    Imaging:   IMPRESSION:    No acute fracture or dislocation.    Redemonstration of posterior instrumented fixation and interbody fusions from L3-S1 without interval hardware complication/failure. Spinal alignment is not significantly changed.    Other chronic findings, including mild multilevel degenerative disc disease above the level of instrumentation, not significantly changed.    Impression: Patient doing well status post above surgery, pain improved.     Plan:     Will start Pt, may start to wean out of the brace. . We will plan on seeing the patient back in the office in 2 mopnths. If any problems develop prior to that time the patient will let us know and we can see him right away.

## 2020-12-14 ENCOUNTER — Telehealth: Payer: Self-pay | Admitting: Family Medicine

## 2020-12-14 NOTE — Telephone Encounter (Addendum)
I've reported pt information to mAb infusion team.  First day of symptoms 12/12/2020.  Looks like COVID test is pending.  plz call tomorrow for update on symptoms.

## 2020-12-16 ENCOUNTER — Telehealth: Payer: Self-pay | Admitting: Family

## 2020-12-16 ENCOUNTER — Encounter: Payer: Self-pay | Admitting: Family Medicine

## 2020-12-16 NOTE — Telephone Encounter (Signed)
Glad to hear. Plz call again on Monday for final update on symptoms.

## 2020-12-16 NOTE — Telephone Encounter (Signed)
Spoke with pt asking for an update.  States he is feeling great.  Sore throat, headache and everything is gone.  Says he declined infusion since he was feeling better.

## 2020-12-16 NOTE — Telephone Encounter (Signed)
Called to Discuss with patient about Covid symptoms and the use of the monoclonal antibody infusion for those with mild to moderate Covid symptoms and at a high risk of hospitalization.     Pt appears to qualify for this infusion due to co-morbid conditions and/or a member of an at-risk group in accordance with the FDA Emergency Use Authorization.    Johnny Holland began having symptoms on 12/25. He is unvaccinated. On 12/27 he was experiencing mild symptoms including headache and sore throat. COVID testing was positive at [facility]. Qualifying risk factors include BMI >25, COPD, hyperlipidemia, older age, and coronary artery disease.   Unable to get in contact with Mr. Knutzen. Left a voicemail with hotline number and sent MyChart with additional information.   Marcos Eke, NP 12/16/2020 10:54 AM

## 2020-12-17 NOTE — Telephone Encounter (Signed)
Noted  

## 2020-12-27 ENCOUNTER — Other Ambulatory Visit (HOSPITAL_BASED_OUTPATIENT_CLINIC_OR_DEPARTMENT_OTHER): Payer: Self-pay | Admitting: Nurse Practitioner

## 2020-12-27 DIAGNOSIS — Z981 Arthrodesis status: Secondary | ICD-10-CM

## 2020-12-28 ENCOUNTER — Other Ambulatory Visit (HOSPITAL_BASED_OUTPATIENT_CLINIC_OR_DEPARTMENT_OTHER): Payer: Self-pay | Admitting: Nurse Practitioner

## 2020-12-28 DIAGNOSIS — Z981 Arthrodesis status: Secondary | ICD-10-CM

## 2020-12-28 MED ORDER — PREGABALIN 100 MG OR CAPS
ORAL_CAPSULE | ORAL | 5 refills | Status: DC
Start: 2020-12-28 — End: 2020-12-30

## 2020-12-28 MED ORDER — PREGABALIN 75 MG OR CAPS
ORAL_CAPSULE | ORAL | 5 refills | Status: DC
Start: 2020-12-28 — End: 2020-12-30

## 2020-12-28 NOTE — Telephone Encounter (Signed)
RX already done today

## 2020-12-29 NOTE — Telephone Encounter (Signed)
Pt needs both rx.  75mg  in morning and 100mg  in evening.

## 2020-12-29 NOTE — Telephone Encounter (Signed)
Routing to provider to clarify, there were 2 different refills completed yesterday, one for 75mg  and one for 100mg .

## 2020-12-29 NOTE — Telephone Encounter (Signed)
Left Vm for pt with provider message:  Pt needs both rx.  75mg  in morning and 100mg  in evening.

## 2020-12-29 NOTE — Telephone Encounter (Signed)
Patient is calling back state he was told his Rx refill for Lyrica was declined.

## 2020-12-29 NOTE — Telephone Encounter (Signed)
RN called to f/u with pt to find out issue with rx for Lyrica.  NO ANSWER, LVM FOR CB

## 2020-12-30 MED ORDER — PREGABALIN 75 MG OR CAPS
ORAL_CAPSULE | ORAL | 5 refills | Status: DC
Start: 2020-12-30 — End: 2021-06-30

## 2020-12-30 MED ORDER — PREGABALIN 100 MG OR CAPS
ORAL_CAPSULE | ORAL | 5 refills | Status: DC
Start: 2020-12-30 — End: 2021-06-29

## 2020-12-30 NOTE — Telephone Encounter (Signed)
Noted will re send to provide e script

## 2020-12-30 NOTE — Telephone Encounter (Signed)
Pt returning phone call and would like to speak with someone to get a refill on lyrica. Pt was declined refill.

## 2020-12-30 NOTE — Telephone Encounter (Signed)
Patient would like his Rx's routed electronically to his pharmacy, in [place] it looks like the Rx's were printed but he wants them E-Prescribed:    pregabalin (LYRICA) 100 MG capsule (Order# [id number])    Date and Time: 12/28/2020 9:52 AM Department: [place] Ordering: Timmie Foerster, NP Authorizing: Zlomislic, Vinko, MD     pregabalin (LYRICA) 75 MG capsule (Order# [id number])    Date and Time: 12/28/2020 9:52 AM Department: [facility] Ordering: Timmie Foerster, NP Authorizing: Zlomislic, Vinko, MD     Please send message via My Chart when matter resolved.     Pt phone# [phone]

## 2020-12-30 NOTE — Addendum Note (Signed)
Addended by: Elwin Mocha on: 12/30/2020 12:41 PM     Modules accepted: Orders

## 2021-01-18 ENCOUNTER — Telehealth (HOSPITAL_BASED_OUTPATIENT_CLINIC_OR_DEPARTMENT_OTHER): Payer: Self-pay | Admitting: Urology

## 2021-01-18 NOTE — Telephone Encounter (Signed)
error 

## 2021-01-26 MED ORDER — ATORVASTATIN CALCIUM 80 MG PO TABS: ORAL_TABLET | ORAL | 3 refills | Status: DC

## 2021-02-02 ENCOUNTER — Ambulatory Visit
Admission: RE | Admit: 2021-02-02 | Discharge: 2021-02-02 | Disposition: A | Discharge status: Home / Self Care | Payer: Medicare Other | Attending: Medical | Admitting: Medical

## 2021-02-02 ENCOUNTER — Ambulatory Visit (HOSPITAL_BASED_OUTPATIENT_CLINIC_OR_DEPARTMENT_OTHER): Payer: Medicare Other | Admitting: Nurse Practitioner

## 2021-02-02 ENCOUNTER — Encounter (HOSPITAL_BASED_OUTPATIENT_CLINIC_OR_DEPARTMENT_OTHER): Payer: Self-pay | Admitting: Nurse Practitioner

## 2021-02-02 DIAGNOSIS — Z981 Arthrodesis status: Secondary | ICD-10-CM

## 2021-02-02 NOTE — Progress Notes (Signed)
Division of Spine Surgery    Post Op Progress Note    Date Of Surgery:   08/18/2020    Chief Complaint:   Post Op from Stage 1: Anterior lumbar interbody fusion, bone morphogenic protein, lumbar 5- sacral 1, Stage 2: Lumbar decompression and fusions with instrumentation, allograft versus autograft, lumbar 5-sacral 1, and Anterior Spine Exposure For Anterior Lumbar Interbody Fusion     HPI:  Stephen [name] is a 70 year old male who returns for his post operative visit and is doing very well.  He only has some occasional midline lower back discomfort with activity.  He reports no radiating symptoms down his lower extremities.  He has been going to physical therapy once a week.  He no longer wears the back brace.  He is not taking any pain medications.  He denies any fevers or chills.    Physical Exam:  General: 70 year old male in no acute distress.   Vitals: There were no vitals filed for this visit.  Motor:       Right  Left    Iliopsoas (L2/3)   5/5   5/5    Quads (L4)   5/5   5/5   Tib Ant (L4/5)    5/5     5/5   EHL (L5)    5/5   5/5   Gastroc (S1)   5/5   5/5    Sensory:   Bilaterally, intact to light touch from L1-S1    Reflexes:   Bilaterally, no hoffmans or clonus    Skin:   Incision is clean, dry and intact without signs of erythema or drainage.  He has chronic lower extremity lymph edema bilaterally.    Vascular:   2+ DP/PT    Radiographs:  Radiographs from today were reviewed and are notable for stable hardware L3-S1.  No changes from prior xrays    Impression:  Post Op from Above Surgery    Plan:  Continue physical therapy and HEP.      After a discussion we were able to answer all of his questions and he voiced understanding of this plan and will follow up in 6 months.    On follow up he will need repeat radiographs of the lumbar spine.    Kallie Edward, PA-C  Physician Assistant, Orthopaedic Spine Surgery    Attending Physician  R. Kenn File, MD  Orthopaedic Spine Surgery

## 2021-02-17 ENCOUNTER — Telehealth: Payer: Self-pay | Admitting: *Deleted

## 2021-02-17 ENCOUNTER — Telehealth (HOSPITAL_BASED_OUTPATIENT_CLINIC_OR_DEPARTMENT_OTHER): Payer: Self-pay | Admitting: Nurse Practitioner

## 2021-02-17 NOTE — Telephone Encounter (Signed)
Attempted to contact patient to schedule lung screening. Left message to call Shawn at [phone]. 

## 2021-02-17 NOTE — Telephone Encounter (Signed)
[facility] sent over progress notes to the fax#[phone] on 02/17/2021. PT facility is requesting a signed copy back.  Please advise.    Fax: [phone]

## 2021-02-18 NOTE — Telephone Encounter (Signed)
Routed to forms

## 2021-02-19 ENCOUNTER — Telehealth: Payer: Self-pay | Admitting: *Deleted

## 2021-02-19 NOTE — Telephone Encounter (Signed)
Pt scheduled for LCS on 3/15 at 9:30.

## 2021-02-22 ENCOUNTER — Other Ambulatory Visit: Payer: Self-pay | Admitting: *Deleted

## 2021-02-22 DIAGNOSIS — Z87891 Personal history of nicotine dependence: Secondary | ICD-10-CM

## 2021-02-22 DIAGNOSIS — Z122 Encounter for screening for malignant neoplasm of respiratory organs: Secondary | ICD-10-CM

## 2021-02-22 NOTE — Progress Notes (Signed)
Contacted and scheduled for annual lung screening scan. Patient is a former smoker, quit 07/2011, 42 pack year history.

## 2021-02-23 NOTE — Telephone Encounter (Signed)
Faxed POC to [phone].  Matter resolved

## 2021-03-02 ENCOUNTER — Ambulatory Visit
Admission: RE | Admit: 2021-03-02 | Discharge: 2021-03-02 | Disposition: A | Discharge status: Home / Self Care | Payer: Medicare Other | Source: Ambulatory Visit | Attending: Nurse Practitioner | Admitting: Nurse Practitioner

## 2021-03-02 ENCOUNTER — Other Ambulatory Visit: Payer: Self-pay

## 2021-03-02 DIAGNOSIS — Z122 Encounter for screening for malignant neoplasm of respiratory organs: Secondary | ICD-10-CM

## 2021-03-02 DIAGNOSIS — Z87891 Personal history of nicotine dependence: Secondary | ICD-10-CM

## 2021-03-04 ENCOUNTER — Other Ambulatory Visit: Payer: Self-pay | Admitting: Family Medicine

## 2021-03-04 DIAGNOSIS — Z125 Encounter for screening for malignant neoplasm of prostate: Secondary | ICD-10-CM

## 2021-03-04 DIAGNOSIS — R7303 Prediabetes: Secondary | ICD-10-CM

## 2021-03-04 DIAGNOSIS — E7800 Pure hypercholesterolemia, unspecified: Secondary | ICD-10-CM

## 2021-03-04 DIAGNOSIS — R748 Abnormal levels of other serum enzymes: Secondary | ICD-10-CM

## 2021-03-05 ENCOUNTER — Other Ambulatory Visit (INDEPENDENT_AMBULATORY_CARE_PROVIDER_SITE_OTHER): Payer: Medicare Other

## 2021-03-05 ENCOUNTER — Other Ambulatory Visit: Payer: Self-pay

## 2021-03-05 DIAGNOSIS — R7303 Prediabetes: Secondary | ICD-10-CM

## 2021-03-05 DIAGNOSIS — R748 Abnormal levels of other serum enzymes: Secondary | ICD-10-CM

## 2021-03-05 DIAGNOSIS — E7800 Pure hypercholesterolemia, unspecified: Secondary | ICD-10-CM | POA: Diagnosis not present

## 2021-03-05 DIAGNOSIS — Z125 Encounter for screening for malignant neoplasm of prostate: Secondary | ICD-10-CM | POA: Diagnosis not present

## 2021-03-05 LAB — LIPID PANEL
Cholesterol: 139 mg/dL (ref 0–200)
HDL: 53.9 mg/dL (ref >39.00)
LDL Cholesterol: 67 mg/dL (ref 0–99)
NonHDL: 85.27
Total CHOL/HDL Ratio: 3
Triglycerides: 93 mg/dL (ref 0.0–149.0)
VLDL: 18.6 mg/dL (ref 0.0–40.0)

## 2021-03-05 LAB — CBC WITH DIFFERENTIAL/PLATELET
Basophils Absolute: 0 10*3/uL (ref 0.0–0.1)
Basophils Relative: 0.6 % (ref 0.0–3.0)
Eosinophils Absolute: 0.2 10*3/uL (ref 0.0–0.7)
Eosinophils Relative: 2.1 % (ref 0.0–5.0)
HCT: 44.1 % (ref 39.0–52.0)
Hemoglobin: 14.9 g/dL (ref 13.0–17.0)
Lymphocytes Relative: 26.7 % (ref 12.0–46.0)
Lymphs Abs: 1.9 10*3/uL (ref 0.7–4.0)
MCHC: 33.8 g/dL (ref 30.0–36.0)
MCV: 87.4 fl (ref 78.0–100.0)
Monocytes Absolute: 0.8 10*3/uL (ref 0.1–1.0)
Monocytes Relative: 11.1 % (ref 3.0–12.0)
Neutro Abs: 4.3 10*3/uL (ref 1.4–7.7)
Neutrophils Relative %: 59.5 % (ref 43.0–77.0)
Platelets: 183 10*3/uL (ref 150.0–400.0)
RBC: 5.04 Mil/uL (ref 4.22–5.81)
RDW: 13.7 % (ref 11.5–15.5)
WBC: 7.2 10*3/uL (ref 4.0–10.5)

## 2021-03-05 LAB — COMPREHENSIVE METABOLIC PANEL
ALT: 17 U/L (ref 0–53)
AST: 16 U/L (ref 0–37)
Albumin: 4 g/dL (ref 3.5–5.2)
Alkaline Phosphatase: 94 U/L (ref 39–117)
BUN: 14 mg/dL (ref 6–23)
CO2: 30 mEq/L (ref 19–32)
Calcium: 9.4 mg/dL (ref 8.4–10.5)
Chloride: 104 mEq/L (ref 96–112)
Creatinine, Ser: 1.03 mg/dL (ref 0.40–1.50)
GFR: 75.28 mL/min (ref >60.00)
Glucose, Bld: 86 mg/dL (ref 70–99)
Potassium: 4.4 mEq/L (ref 3.5–5.1)
Sodium: 140 mEq/L (ref 135–145)
Total Bilirubin: 1 mg/dL (ref 0.2–1.2)
Total Protein: 6.9 g/dL (ref 6.0–8.3)

## 2021-03-05 LAB — HEMOGLOBIN A1C: Hgb A1c MFr Bld: 5.7 % (ref 4.6–6.5)

## 2021-03-05 LAB — PSA, MEDICARE: PSA: 2.57 ng/ml (ref 0.10–4.00)

## 2021-03-08 ENCOUNTER — Encounter: Payer: Self-pay | Admitting: Family Medicine

## 2021-03-08 ENCOUNTER — Ambulatory Visit (INDEPENDENT_AMBULATORY_CARE_PROVIDER_SITE_OTHER): Payer: Medicare Other | Admitting: Family Medicine

## 2021-03-08 ENCOUNTER — Other Ambulatory Visit: Payer: Self-pay

## 2021-03-08 VITALS — BP 110/60 | HR 54 | Temp 97.2°F | Ht 68.0 in | Wt 220.1 lb

## 2021-03-08 DIAGNOSIS — Z0000 Encounter for general adult medical examination without abnormal findings: Secondary | ICD-10-CM | POA: Diagnosis not present

## 2021-03-08 DIAGNOSIS — I351 Nonrheumatic aortic (valve) insufficiency: Secondary | ICD-10-CM

## 2021-03-08 DIAGNOSIS — Z7189 Other specified counseling: Secondary | ICD-10-CM | POA: Insufficient documentation

## 2021-03-08 DIAGNOSIS — I350 Nonrheumatic aortic (valve) stenosis: Secondary | ICD-10-CM

## 2021-03-08 DIAGNOSIS — E7800 Pure hypercholesterolemia, unspecified: Secondary | ICD-10-CM

## 2021-03-08 DIAGNOSIS — N529 Male erectile dysfunction, unspecified: Secondary | ICD-10-CM

## 2021-03-08 DIAGNOSIS — K760 Fatty (change of) liver, not elsewhere classified: Secondary | ICD-10-CM

## 2021-03-08 DIAGNOSIS — Z720 Tobacco use: Secondary | ICD-10-CM | POA: Diagnosis not present

## 2021-03-08 DIAGNOSIS — I700 Atherosclerosis of aorta: Secondary | ICD-10-CM

## 2021-03-08 DIAGNOSIS — I714 Abdominal aortic aneurysm, without rupture, unspecified: Secondary | ICD-10-CM

## 2021-03-08 DIAGNOSIS — J432 Centrilobular emphysema: Secondary | ICD-10-CM

## 2021-03-08 DIAGNOSIS — R7303 Prediabetes: Secondary | ICD-10-CM

## 2021-03-08 DIAGNOSIS — E669 Obesity, unspecified: Secondary | ICD-10-CM

## 2021-03-08 DIAGNOSIS — I2510 Atherosclerotic heart disease of native coronary artery without angina pectoris: Secondary | ICD-10-CM

## 2021-03-08 DIAGNOSIS — I6529 Occlusion and stenosis of unspecified carotid artery: Secondary | ICD-10-CM

## 2021-03-08 DIAGNOSIS — R748 Abnormal levels of other serum enzymes: Secondary | ICD-10-CM

## 2021-03-08 MED ORDER — SILDENAFIL CITRATE 100 MG PO TABS: 50.0000 mg | ORAL_TABLET | ORAL | 3 refills | Status: DC | PRN

## 2021-03-08 NOTE — Assessment & Plan Note (Addendum)
Sees cardiology yearly w/ echocardiogram.

## 2021-03-08 NOTE — Assessment & Plan Note (Signed)
viagra dosing effective.

## 2021-03-08 NOTE — Assessment & Plan Note (Signed)
Continued vaping. Encouraged full cessation. Precontemplative. Continues lung cancer screening CTs.

## 2021-03-08 NOTE — Assessment & Plan Note (Signed)
By imaging. Pt asymptomatic.

## 2021-03-08 NOTE — Progress Notes (Signed)
Patient ID: Johnny Holland, male    DOB: Dec 25, 1950, 70 y.o.   MRN: [id number]  This visit was conducted in person.  BP 110/60 (BP Location: Left Arm, Patient Position: Sitting, Cuff Size: Normal)   Pulse (!) 54   Temp (!) 97.2 F (36.2 C) (Temporal)   Ht 5\' 8"  (1.727 m)   Wt 220 lb 2 oz (99.8 kg)   SpO2 94%   BMI 33.47 kg/m    CC: CPE Subjective:   HPI: Johnny Holland is a 70 y.o. male presenting on 03/08/2021 for Medicare Wellness   Esophageal air fluid level - denies GERD or dysphagia symptoms.  12/2020 mild COVID - symptoms have resolved except for dry mouth.   Fatty liver by CT abdomen 02/2020.   Preventative: COLONOSCOPY WITH PROPOFOL 04/13/2020 - TA x2, divrticulosis, rpt 5 yrs (Vanga, Tally Due, MD) Prostate cancer screening -yearly, no significant nocturia  Lung cancer screening -yearly, reassuring  Fluvax - declines COVID - declines  Td - 2007, Tdap 2018  Prevnar-13 02/2019, pneumovax today  Zostavax - 2014  Shingrix - 08/2017, 12/2017 ([facility])  Advanced directive discussion - has at home, wife is HCPOA. Asked to bring [name] copy. Seat belt use discussed  Sunscreen use discussed.No suspicious moles.Sees derm regularly.  Ex smoker - quit smoking 2011 prior 1-2 ppd (42+ PY history). Continues vaping 1 cartridge/day.  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 Interim medical history since our last visit reviewed. Allergies and medications reviewed and updated. Outpatient Medications Prior to Visit   Medication Sig Dispense Refill  . albuterol (PROVENTIL HFA;VENTOLIN HFA) 108 (90 Base) MCG/ACT inhaler Inhale 2 puffs into the lungs every 6 (six) hours as needed for wheezing or shortness of breath. 1 Inhaler 0  . aspirin 81 MG tablet Take 1 tablet (81 mg total) by mouth daily.    Marland Kitchen atorvastatin (LIPITOR) 80 MG tablet TAKE 1/2 TABLET(40MG  TOTAL)DAILY 45 tablet 3  . Multiple Vitamin (MULTI-VITAMIN DAILY PO) Take by mouth.    Marland Kitchen scopolamine (TRANSDERM-SCOP, 1.5 MG,) 1 MG/3DAYS Place 1 patch (1.5 mg total) onto the skin every 3 (three) days. (Patient taking differently: Place 1 patch onto the skin as needed.) 4 patch 1  . sildenafil (VIAGRA) 100 MG tablet TAKE ONE-HALF TO ONE TABLET(50 TO 100MG  TOTAL) BY MOUTH DAILY AS NEEDED FOR ERECTILE DYSFUNCTION. GENERIC EQUIVALENT FOR VIAGRA 15 tablet 3   No facility-administered medications prior to visit.     Per HPI unless specifically indicated in ROS section below Review of Systems Objective:  BP 110/60 (BP Location: Left Arm, Patient Position: Sitting, Cuff Size: Normal)   Pulse (!) 54   Temp (!) 97.2 F (36.2 C) (Temporal)   Ht 5\' 8"  (1.727 m)   Wt 220 lb 2 oz (99.8 kg)   SpO2 94%   BMI 33.47 kg/m   Wt Readings from Last 3 Encounters:  03/08/21 220 lb 2 oz (99.8 kg)  03/02/21 218 lb (98.9 kg)  10/20/20 218 lb 8 oz (99.1 kg)      Physical Exam Vitals and nursing note reviewed.  Constitutional:      General: He is not in acute distress.    Appearance: Normal appearance. He is well-developed. He is not ill-appearing.  HENT:     Head: Normocephalic and atraumatic.     Right Ear: Hearing, tympanic membrane, ear canal and external ear normal.     Left Ear: Hearing, tympanic membrane, ear canal and external ear normal.     Ears:     Comments: Wears hearing aides    Mouth/Throat:     Pharynx: Uvula midline.  Eyes:     General: No scleral icterus.    Extraocular Movements: Extraocular movements intact.     Conjunctiva/sclera:  Conjunctivae normal.     Pupils: Pupils are equal, round, and reactive to light.  Cardiovascular:     Rate and Rhythm: Normal rate and regular rhythm.     Pulses: Normal pulses.          Radial pulses are 2+ on the right side and 2+ on the left side.     Heart sounds: Murmur (3/6 systolic best USB) heard.    Pulmonary:     Effort: Pulmonary effort is normal. No respiratory distress.     Breath sounds: Normal breath sounds. No wheezing, rhonchi or rales.  Abdominal:     General: Abdomen is flat. Bowel sounds are normal. There is no distension.     Palpations: Abdomen is soft. There is no mass.     Tenderness: There is no abdominal tenderness. There is no guarding or rebound.     Hernia: No hernia is present.  Musculoskeletal:        General: Normal range of motion.     Cervical back: Normal range of motion and neck supple.     Right lower leg: No edema.     Left lower leg: No edema.  Lymphadenopathy:     Cervical: No cervical adenopathy.  Skin:    General: Skin is warm and dry.     Findings: No rash.  Neurological:     General: No focal deficit present.     Mental Status: He is alert and oriented to person, place, and time.     Comments:  CN grossly intact, station and gait intact Recall 2/3, 3/3 with cue Calculation 5/5 DLROW  Psychiatric:        Mood and Affect: Mood normal.        Behavior: Behavior normal.        Thought Content: Thought content normal.        Judgment: Judgment normal.       Results for orders placed or performed in visit on 03/05/21  CBC with Differential/Platelet  Result Value Ref Range   WBC 7.2 4.0 - 10.5 K/uL   RBC 5.04 4.22 - 5.81 Mil/uL   Hemoglobin 14.9 13.0 - 17.0 g/dL   HCT 44.1 39.0 - 52.0 %   MCV 87.4 78.0 - 100.0 fl   MCHC 33.8 30.0 - 36.0 g/dL   RDW 13.7 11.5 - 15.5 %   Platelets 183.0 150.0 - 400.0 K/uL   Neutrophils Relative % 59.5 43.0 - 77.0 %   Lymphocytes Relative 26.7 12.0 - 46.0 %   Monocytes Relative 11.1 3.0 - 12.0 %    Eosinophils Relative 2.1 0.0 - 5.0 %   Basophils Relative 0.6 0.0 - 3.0 %   Neutro Abs 4.3 1.4 - 7.7 K/uL   Lymphs Abs 1.9 0.7 - 4.0 K/uL   Monocytes Absolute 0.8 0.1 - 1.0 K/uL  Eosinophils Absolute 0.2 0.0 - 0.7 K/uL   Basophils Absolute 0.0 0.0 - 0.1 K/uL  PSA, Medicare  Result Value Ref Range   PSA 2.57 0.10 - 4.00 ng/ml  Hemoglobin A1c  Result Value Ref Range   Hgb A1c MFr Bld 5.7 4.6 - 6.5 %  Comprehensive metabolic panel  Result Value Ref Range   Sodium 140 135 - 145 mEq/L   Potassium 4.4 3.5 - 5.1 mEq/L   Chloride 104 96 - 112 mEq/L   CO2 30 19 - 32 mEq/L   Glucose, Bld 86 70 - 99 mg/dL   BUN 14 6 - 23 mg/dL   Creatinine, Ser 1.03 0.40 - 1.50 mg/dL   Total Bilirubin 1.0 0.2 - 1.2 mg/dL   Alkaline Phosphatase 94 39 - 117 U/L   AST 16 0 - 37 U/L   ALT 17 0 - 53 U/L   Total Protein 6.9 6.0 - 8.3 g/dL   Albumin 4.0 3.5 - 5.2 g/dL   GFR 75.28 >60.00 mL/min   Calcium 9.4 8.4 - 10.5 mg/dL  Lipid panel  Result Value Ref Range   Cholesterol 139 0 - 200 mg/dL   Triglycerides 93.0 0.0 - 149.0 mg/dL   HDL 53.90 >39.00 mg/dL   VLDL 18.6 0.0 - 40.0 mg/dL   LDL Cholesterol 67 0 - 99 mg/dL   Total CHOL/HDL Ratio 3    NonHDL 85.27    Assessment & Plan:  This visit occurred during the SARS-CoV-2 public health emergency.  Safety protocols were in place, including screening questions prior to the visit, additional usage of staff PPE, and extensive cleaning of exam room while observing appropriate contact time as indicated for disinfecting solutions.   Problem List Items Addressed This Visit    HLD (hyperlipidemia)    Chronic, stable. Continue atorvastatin 80mg  1/2 dose The 10-year ASCVD risk score Mikey Bussing DC Jr., et al., 2013) is: 8.8%   Values used to calculate the score:     Age: 84 years     Sex: Male     Is Non-Hispanic African American: No     Diabetic: No     Tobacco smoker: No     Systolic Blood Pressure: 202 mmHg     Is BP treated: No     HDL Cholesterol: 53.9  mg/dL     Total Cholesterol: 139 mg/dL       Relevant Medications   sildenafil (VIAGRA) 100 MG tablet   Aortic regurgitation    Sees cardiology yearly w/ echocardiogram.       Relevant Medications   sildenafil (VIAGRA) 100 MG tablet   Carotid stenosis    Minimal - largely referred from heart murmur      Relevant Medications   sildenafil (VIAGRA) 100 MG tablet   Obesity, Class I, BMI 30-34.9    Congratulated on 15 lb weight loss since last year. Encouraged ongoing efforts towards healthy diet and lifestyle choices to affect sustainable weight loss.       Prediabetes    Stable period - will continue to monitor.       Erectile dysfunction    viagra dosing effective.       Thoracic aorta atherosclerosis (HCC)    Continue aspirin, statin.       Relevant Medications   sildenafil (VIAGRA) 100 MG tablet   CAD (coronary artery disease)   Relevant Medications   sildenafil (VIAGRA) 100 MG tablet   COPD (chronic obstructive pulmonary disease) ([place])    By imaging. Pt  asymptomatic.       Hepatic steatosis    H/o this by prior imaging, anticipate improvement with 15 lb weight loss over the past year.       Elevated alkaline phosphatase level    H/o this, now levels normal - anticipate improvement due to 15 lb weight loss over the past year.       Abdominal aortic aneurysm (AAA) without rupture ([place])    Will be due for rpt [name] 02/2023      Relevant Medications   sildenafil (VIAGRA) 100 MG tablet   Mild aortic stenosis    Sees cards yearly.       Relevant Medications   sildenafil (VIAGRA) 100 MG tablet   Vapes nicotine containing substance    Continued vaping. Encouraged full cessation. Precontemplative. Continues lung cancer screening CTs.       Medicare annual wellness visit, initial - Primary    I have personally reviewed the Medicare Annual Wellness questionnaire and have noted 1. The patient's medical and social history 2. Their use of alcohol, tobacco or  illicit drugs 3. Their current medications and supplements 4. The patient's functional ability including ADL's, fall risks, home safety risks and hearing or visual impairment. Cognitive function has been assessed and addressed as indicated.  5. Diet and physical activity 6. Evidence for depression or mood disorders The patients weight, height, BMI have been recorded in the chart. I have made referrals, counseling and provided education to the patient based on review of the above and I have provided the pt with a written personalized care plan for preventive services. Provider list updated.. See scanned questionairre as needed for further documentation. Reviewed preventative protocols and updated unless pt declined.       Advanced directives, counseling/discussion    Advanced directive discussion - has at home, wife is HCPOA. Asked to bring [name] copy.          Meds ordered this encounter  Medications  . sildenafil (VIAGRA) 100 MG tablet    Sig: Take 0.5-1 tablets (50-100 mg total) by mouth as needed for erectile dysfunction.    Dispense:  15 tablet    Refill:  3   No orders of the defined types were placed in this encounter.   Patient instructions: Bring [name] a copy of your living will/advanced directive to update your chart.  You are doing well today. Return as needed or in 1 year for next wellness visit.   Follow up plan: Return in about 1 year (around 03/08/2022) for medicare wellness visit.  Ria Bush, MD

## 2021-03-08 NOTE — Assessment & Plan Note (Signed)
H/o this, now levels normal - anticipate improvement due to 15 lb weight loss over the past year.

## 2021-03-08 NOTE — Assessment & Plan Note (Addendum)
Will be due for rpt [name] 02/2023

## 2021-03-08 NOTE — Assessment & Plan Note (Signed)
Sees cards yearly.

## 2021-03-08 NOTE — Assessment & Plan Note (Signed)

## 2021-03-08 NOTE — Assessment & Plan Note (Signed)
H/o this by prior imaging, anticipate improvement with 15 lb weight loss over the past year.

## 2021-03-08 NOTE — Assessment & Plan Note (Signed)
Continue aspirin, statin.  

## 2021-03-08 NOTE — Assessment & Plan Note (Signed)
Stable period - will continue to monitor.

## 2021-03-08 NOTE — Assessment & Plan Note (Signed)
Advanced directive discussion - has at home, wife is HCPOA. Asked to bring [name] copy.

## 2021-03-08 NOTE — Assessment & Plan Note (Signed)
Minimal - largely referred from heart murmur

## 2021-03-08 NOTE — Assessment & Plan Note (Signed)
Congratulated on 15 lb weight loss since last year. Encouraged ongoing efforts towards healthy diet and lifestyle choices to affect sustainable weight loss.

## 2021-03-08 NOTE — Patient Instructions (Addendum)
Bring [name] a copy of your living will/advanced directive to update your chart.  You are doing well today. Return as needed or in 1 year for next wellness visit.   Health Maintenance After Age 68 After age 39, you are at a higher risk for certain long-term diseases and infections as well as injuries from falls. Falls are a major cause of broken bones and head injuries in people who are older than age 5. Getting regular preventive care can help to keep you healthy and well. Preventive care includes getting regular testing and making lifestyle changes as recommended by your health care provider. Talk with your health care provider about:  Which screenings and tests you should have. A screening is a test that checks for a disease when you have no symptoms.  A diet and exercise plan that is right for you. What should I know about screenings and tests to prevent falls? Screening and testing are the best ways to find a health problem early. Early diagnosis and treatment give you the best chance of managing medical conditions that are common after age 54. Certain conditions and lifestyle choices may make you more likely to have a fall. Your health care provider may recommend:  Regular vision checks. Poor vision and conditions such as cataracts can make you more likely to have a fall. If you wear glasses, make sure to get your prescription updated if your vision changes.  Medicine review. Work with your health care provider to regularly review all of the medicines you are taking, including over-the-counter medicines. Ask your health care provider about any side effects that may make you more likely to have a fall. Tell your health care provider if any medicines that you take make you feel dizzy or sleepy.  Osteoporosis screening. Osteoporosis is a condition that causes the bones to get weaker. This can make the bones weak and cause them to break more easily.  Blood pressure screening. Blood pressure changes  and medicines to control blood pressure can make you feel dizzy.  Strength and balance checks. Your health care provider may recommend certain tests to check your strength and balance while standing, walking, or changing positions.  Foot health exam. Foot pain and numbness, as well as not wearing proper footwear, can make you more likely to have a fall.  Depression screening. You may be more likely to have a fall if you have a fear of falling, feel emotionally low, or feel unable to do activities that you used to do.  Alcohol use screening. Using too much alcohol can affect your balance and may make you more likely to have a fall. What actions can I take to lower my risk of falls? General instructions  Talk with your health care provider about your risks for falling. Tell your health care provider if: ? You fall. Be sure to tell your health care provider about all falls, even ones that seem minor. ? You feel dizzy, sleepy, or off-balance.  Take over-the-counter and prescription medicines only as told by your health care provider. These include any supplements.  Eat a healthy diet and maintain a healthy weight. A healthy diet includes low-fat dairy products, low-fat (lean) meats, and fiber from whole grains, beans, and lots of fruits and vegetables. Home safety  Remove any tripping hazards, such as rugs, cords, and clutter.  Install safety equipment such as grab bars in bathrooms and safety rails on stairs.  Keep rooms and walkways well-lit. Activity  Follow a regular exercise  program to stay fit. This will help you maintain your balance. Ask your health care provider what types of exercise are appropriate for you.  If you need a cane or walker, use it as recommended by your health care provider.  Wear supportive shoes that have nonskid soles.   Lifestyle  Do not drink alcohol if your health care provider tells you not to drink.  If you drink alcohol, limit how much you  have: ? 0-1 drink a day for women. ? 0-2 drinks a day for men.  Be aware of how much alcohol is in your drink. In the [place] one drink equals one typical bottle of beer (12 oz), one-half glass of wine (5 oz), or one shot of hard liquor (1 oz).  Do not use any products that contain nicotine or tobacco, such as cigarettes and e-cigarettes. If you need help quitting, ask your health care provider. Summary  Having a healthy lifestyle and getting preventive care can help to protect your health and wellness after age 3.  Screening and testing are the best way to find a health problem early and help you avoid having a fall. Early diagnosis and treatment give you the best chance for managing medical conditions that are more common for people who are older than age 70.  Falls are a major cause of broken bones and head injuries in people who are older than age 58. Take precautions to prevent a fall at home.  Work with your health care provider to learn what changes you can make to improve your health and wellness and to prevent falls. This information is not intended to replace advice given to you by your health care provider. Make sure you discuss any questions you have with your health care provider. Document Revised: 03/28/2019 Document Reviewed: 10/18/2017 Elsevier Patient Education  2021 [employer].

## 2021-03-08 NOTE — Assessment & Plan Note (Signed)
Chronic, stable. Continue atorvastatin 80mg  1/2 dose The 10-year ASCVD risk score Mikey Bussing DC Jr., et al., 2013) is: 8.8%   Values used to calculate the score:     Age: 70 years     Sex: Male     Is Non-Hispanic African American: No     Diabetic: No     Tobacco smoker: No     Systolic Blood Pressure: 161 mmHg     Is BP treated: No     HDL Cholesterol: 53.9 mg/dL     Total Cholesterol: 139 mg/dL

## 2021-03-09 NOTE — Progress Notes (Signed)
Department of Urology  Clinic Note    Referring Provider: Self, Referred  Chief complaint: No chief complaint on file.      IMPRESSION AND TREATMENT PLAN   Stephen [name] is a 70 year old male with congenital hydronephrosis at 70 yo, TURP and recurrent BNC previously managed with [facility], then SPT, now s/p DVIU with [facility] 12/07/2017:    #BNC 2/2 TURP s/p BNI with [facility] (12/07/2017)  -IPSS  -Open on last cysto 12/2018  -Able to void 560ml with PVR 55ml. Could perform timed voiding Q6 hours with additional voids during the day and before bed, as well as other behavioral management including elevating legs early in the evening to mobilize fluid.  -Schedule for cystoscopy    #Solitary left kidney, hydronephrosis  [name] with hydronephrosis and debris. Could be due to upper or lower tract obstruction.   -Obtain BMP today  -Schedule for CTU to evaluate kidney drainage    RTC: schedule for cysto, obtain CTU prior to next appt    All of the patient's questions were answered to satisfaction and completion.    Dixon Boos, MD  Urology PGY3    Burnetta Sabin, MD  03/10/2021 10:49 AM     I reviewed the patient's history, pertinent labs and imaging and counseled the patient. I agree with the documentation completed.    Cysto- confirm BN is open, PVR= low on repeat, Cr today and CT Urogram if normal Cr.    Consider- Nuc Med study     Sharyne Peach. 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 Lucia Bitter, was seen in ED 06/24/2018 due to hematuria, clot retention, hand irrigated through SPT.    Interval history:  Has lower back spasms. H/o spinal surgery. No new pain in left flank.   Voids ~1348ml in the morning. Another void with 400-550ml. Gets urge to void in the middle of the night but does not  get up.  Unsure how much he drinks. Has lymphedema in his legs. Wears compression stockings sometimes.  Denies UTIs, f/c/n/v, dysuria, gross hematuria. Feels he empties all the way.    Creatinine 08/19/20: 1.02    Abd [name] 01/11/21  IMPRESSION:    Marked left-sided hydronephrosis with debris in the renal collecting system. Please correlate for superimposed infection.     Hepatomegaly with hepatic steatosis.    IPSS Form--patient completed  [place] URO IPSS REVIEW FS 01/01/2019 04/26/2018   Incomplete Emptying 0 0   Frequency 0 0   Intermittencey 0 0   Urgency 0 0   Weak Stream 0 0   Straining 1 1   Nocturia 1 0   IPSS Total Score 2 1   Quality of Life 3 2       No flowsheet data found.    REVIEW OF SYSTEMS         PATIENT HISTORY     Past Medical History:   Diagnosis Date    Chronic suprapubic catheter (CMS-HCC)     Congenital hydronephrosis     Gout     Headache     Hematuria     Kidney disease     Kidney stones     Major depressive disorder, single episode  Polyarthropathy or polyarthritis of multiple sites     Retinal detachment     Urethral stricture      Past Surgical History:   Procedure Laterality Date    CT INSERTION OF SUPRAPUBIC CATH  09/25/2015    NEPHRECTOMY Right 1995    APPENDECTOMY      COLONOSCOPY      CYSTOSCOPY      CYSTOSCOPY W/ LASER LITHOTRIPSY      OTHER SURGICAL HISTORY      Interstim 01/29/2011    TRANSURETHRAL RESECTION OF PROSTATE         FAMILY HISTORY     Family History   Adopted: Yes   Family history unknown: Yes       SOCIAL HISTORY     Social History     Tobacco Use    Smoking status: Never Smoker    Smokeless tobacco: Never Used   Substance Use Topics    Alcohol use: No       ALLERGIES     Allergies   Allergen Reactions    Amoxicillin Rash    Sulfa Drugs Unspecified       PHYSICAL EXAMINATION      BP 141/61 (BP Location: Left arm, BP Patient Position: Sitting, BP cuff size: Large)    Pulse 67    Temp 97.2 F (36.2 C) (Temporal)    Resp 16    SpO2 96%      Physical exam  chaperone offered by staff : declined     Exam:   General: No acute distress  Neuro:  Alert, oriented  Head: Normocephalic, atraumatic  Neck: Midline trachea  Chest: Nonlabored, symmetric  CV: Regular rate  GI: Soft, nontender, nondistended  Back: no CVAT  GU: deferred  Ext: mild peripheral edema  Skin: No obvious rashes, no lesions  Psych: Appropriate mood      PROCEDURES     Cystoscopy 07/31/2018              Trabeculation: Moderate-Severe              Stones:            None              Diverticuli:       Yes   Open BN- able to take the scope without resistance. SPT seen in the bladder    Flow study 07/31/2018  FR=13cc/sec  VOL=288cc  PVR=5cc  Curve= no sign of obstruction    Cystoscopy 01/01/2019    The patient was prepped and draped in the usual sterile fashion. Lidocaine gel was injected into the urethra. A 61fr flexible cystoscope was introduced into the meatus.    Findings:  Meatus:                       Normal    Urethra              Anterior:          Normal              Membranous:  Normal              Prostatic:         Patent repair    Bladder              Mucosa:          Normal              Trabeculation: severe  Stones:            None              Diverticuli:        yes    Patient tolerated the procedure without difficulty. Findings were discussed with the patient.    Flow study  03/10/2021  Maximum flow: 33.3 ml/sec  Average flow:  22.2 mL/sec  Total volume voided: 501 ml  PVR:   5 ml  Pattern:  Plateau    LABORATORY STUDIES     Lab Results   Component Value Date    NA 144 08/19/2020    K 4.5 08/19/2020    CL 106 08/19/2020    BUN 14 08/19/2020    CREAT 1.02 08/19/2020    GFRNON >60 08/19/2020    [place] 8.7 08/19/2020       Lab Results   Component Value Date    WBC 15.0 (H) 08/21/2020    HGB 11.2 (L) 08/21/2020    HCT 33.4 (L) 08/21/2020    MCV 89.5 08/21/2020    PLT 166 08/21/2020       No results found for: PSA    IMAGING   The following studies were personally reviewed with findings  as described:    RBUS 05/09/17:   LK: 13.8cm  Mild hydronephrosis    Abd [name] 01/11/21  IMPRESSION:    Marked left-sided hydronephrosis with debris in the renal collecting system. Please correlate for superimposed infection.     Hepatomegaly with hepatic steatosis.    HOME MEDICATIONS     Current Outpatient Medications on File Prior to Visit   Medication Sig Dispense Refill    acetaminophen (TYLENOL) 325 MG tablet Take 3 tablets (975 mg) by mouth every 8 hours. 130 tablet 0    ALAWAY 0.025 % ophthalmic solution INSTILL 1 DROP INTO BOTH EYES TWICE A DAY AS DIRECTED      allopurinol (ZYLOPRIM) 300 MG tablet Take 300 mg by mouth daily.      amLODIPINE (NORVASC) 5 MG tablet Take 10 mg by mouth daily.      Cetirizine HCl (ZYRTEC PO) 10 mg.       ciclopirox (LOPROX) 0.77 % GEL Apply topically 2 times daily.      docusate sodium (COLACE) 100 MG capsule Take 2 capsules (200 mg) by mouth at bedtime. 60 capsule 0    omeprazole (PRILOSEC) 20 MG capsule Take 20 mg by mouth daily.      oxybutynin (DITROPAN) 5 MG tablet Take 1 tablet (5 mg) by mouth 3 times daily. 336 tablet 4    Polyethyl Glycol-Propyl Glycol (SYSTANE ULTRA OP)       pregabalin (LYRICA) 100 MG capsule TAKE 1 CAPSULE BY MOUTH DAILY AT BEDTIME 30 capsule 5    pregabalin (LYRICA) 75 MG capsule TAKE 1 CAPSULE BY MOUTH EVERY MORNING 30 capsule 5    senna (SENOKOT) 8.6 MG tablet Take 2 tablets (17.2 mg) by mouth every morning. 15 tablet 0    triamcinolone (KENALOG) 0.1 % cream Apply 1 Application topically 2 times daily. Apply a thin layer as directed      VITAMIN D PO Take 500 mg by mouth daily.       zolpidem (AMBIEN) 5 MG tablet Take 1 tablet (5 mg) by mouth nightly as needed for Insomnia. 30 tablet 0     No current facility-administered medications on file prior to visit.

## 2021-03-10 ENCOUNTER — Encounter: Payer: Self-pay | Admitting: *Deleted

## 2021-03-10 ENCOUNTER — Other Ambulatory Visit (INDEPENDENT_AMBULATORY_CARE_PROVIDER_SITE_OTHER): Payer: Medicare Other | Attending: Urology

## 2021-03-10 ENCOUNTER — Encounter (HOSPITAL_BASED_OUTPATIENT_CLINIC_OR_DEPARTMENT_OTHER): Payer: Self-pay

## 2021-03-10 ENCOUNTER — Ambulatory Visit: Payer: Medicare Other | Attending: Urology | Admitting: Urology

## 2021-03-10 ENCOUNTER — Encounter (HOSPITAL_BASED_OUTPATIENT_CLINIC_OR_DEPARTMENT_OTHER): Payer: Self-pay | Admitting: Urology

## 2021-03-10 VITALS — BP 141/61 | HR 67 | Temp 97.2°F | Resp 16

## 2021-03-10 DIAGNOSIS — Z905 Acquired absence of kidney: Secondary | ICD-10-CM | POA: Insufficient documentation

## 2021-03-10 DIAGNOSIS — N1330 Unspecified hydronephrosis: Secondary | ICD-10-CM

## 2021-03-10 DIAGNOSIS — N320 Bladder-neck obstruction: Secondary | ICD-10-CM | POA: Insufficient documentation

## 2021-03-10 LAB — BASIC METABOLIC PANEL, BLOOD
Anion Gap: 13 mmol/L (ref 7–15)
BUN: 18 mg/dL (ref 8–23)
Bicarbonate: 25 mmol/L (ref 22–29)
Calcium: 9.7 mg/dL (ref 8.5–10.6)
Chloride: 104 mmol/L (ref 98–107)
Creatinine: 1.19 mg/dL — ABNORMAL HIGH (ref 0.67–1.17)
GFR: >60 mL/min
Glucose: 95 mg/dL (ref 70–99)
Potassium: 4.6 mmol/L (ref 3.5–5.1)
Sodium: 142 mmol/L (ref 136–145)

## 2021-03-10 NOTE — Patient Instructions (Addendum)
PLAN:  1. Complete BMP bloodwork today   2. Call radiology and schedule CT Urogram [phone]  3. Schedule cystoscopy with Dr.Buckley within two weeks (after CT is scheduled)    Cystoscopy Procedure      Please review Patient Instructions Below:    Purpose:  Your physician or PA/NP has ordered this test to examine your urethra and bladder under direct vision through a camera called a scope.  With this procedure we can take a biopsy, collect urine, and better assess urinary structures.    Procedure Preparation: For a cystoscopy performed under local anesthesia, there are no restrictions.  You may eat and drink normally and take your medications, unless your physician gives you other instructions.  Occasionally, antibiotics are ordered for a short time before and following the cystoscopy.    Procedure:  Procedure: If you are male, you may lie on the exam table with your legs in a frog leg position, or you may be positioned in stirrups.  If you are a male, you will lie flat on your back.  Please let someone know if you have concerns about your positioning.  A local anesthetic will be instilled into the urethra.  Then, your physician, PA, or NP will insert a small scope.  As the scope passes through the opening into the bladder, you may feel a desire to urinate; this will pass. The scope will usually be within the body for 1-2 minutes; however, the entire procedure, including setting up, takes approximately 30 minutes.     Post-Procedure Information: For approximately 8-12 hours following the cystoscopy, you may feel the urge to urinate more frequently than usual or you may experience burning with urination.  These discomforts should be minor and should resolve no more than 24 hours after the procedure.  If they continue, please contact the clinic.     Your urine may be slightly red following the cystoscopy; this is normal.  If your urine is bright red or with many clots, please call us immediately.      If you  have the urge to urinate and you are unable to do so, try sitting in a tub of warm water to help you relax and urinate into the bath water.  If you are still unable to urinate, please call us.     Call the Urology office at [phone] and ask to speak to a nurse if you experience any of the following:    Bright red bleeding or many blood clots in your urine   Burning or frequent urination after 24 hours   Inability to urinate   Fever greater than 100.4 degrees Fahrenheit and/or chills   Severe pain in the back or lower abdomen   Rash    If you have any questions and it is after business hours (8am to 5pm), please call the page operator at [phone] and ask to speak to the Urologist on call.           Cecile Hearing, MD     Mailing Address:  [facility], Mail [address]: [id number]  [place], [place] [address]     Clinic Address:  [facility]  Urology Department  [address] [place], [place]  [place], [place] [address]  Appointment Line # [phone]        Executive Assistant/Surgery Coordinator  Inocencio Homes  Office number # [phone]          Clinic Hours: Monday-Friday  8:00am-5:00pm   (Closed Weekends and Holidays)     AFTER HOURS EMERGENCY NUMBER # [phone]  (Ask for the Urologist On-Call)

## 2021-03-10 NOTE — Interdisciplinary (Signed)
PVR Result: 5ml  Catheter: no  Ultrasound: yes

## 2021-03-11 ENCOUNTER — Other Ambulatory Visit: Payer: Self-pay

## 2021-03-12 ENCOUNTER — Telehealth (HOSPITAL_BASED_OUTPATIENT_CLINIC_OR_DEPARTMENT_OTHER): Payer: Self-pay | Admitting: Urology

## 2021-03-12 NOTE — Telephone Encounter (Signed)
-----   Message from Jolaine Artist, MD sent at 03/12/2021  9:17 AM PDT -----  Regarding: RE: Anesthesia / IV Sedation  That works for me. Always better to not have to do sedation.    jcb    ----- Message -----  From: Maudry Diego  Sent: 03/11/2021   5:24 PM PDT  To: Jolaine Artist, MD  Subject: Anesthesia / IV Sedation                         Hello Dr. Lucia Bitter, Patient Stephen [name] MRN [id number] called to schedule his CT Uro that was placed 03/10/21 order was marked with Anesthesia/ IV Sedation. Per patients request to have CT Scan done without Anesthesia or IV Sedation will take Valium medication instead. I did advise patient to verify with you prior to scheduling but patient insisted just wanted to clarify with you.

## 2021-03-12 NOTE — Telephone Encounter (Signed)
Attempted call to preferred phone number on file, no answer, left voicemail to return call to [phone] to advise pt that radiology contacted Dr.Buckley and okay to have CT WITHOUT IV sedation/anesthia. Pt opt to take valium prior to study and would like to advise pt that is okay as long as pt has driver to and from CT appt

## 2021-03-15 NOTE — Telephone Encounter (Signed)
Pt is returning call from previous message. Pt states he would like to keep the CT with IV sedation/anesthia.     Pt states he was not sure if his insurance would cover. Pt is requesting for order to be kept. Please advise/assist.    [phone]

## 2021-03-15 NOTE — Telephone Encounter (Signed)
Routing to radiology team and primary nurse to follow up

## 2021-03-25 ENCOUNTER — Encounter: Payer: Self-pay | Admitting: Family Medicine

## 2021-04-01 ENCOUNTER — Other Ambulatory Visit: Payer: Self-pay

## 2021-04-08 ENCOUNTER — Ambulatory Visit (INDEPENDENT_AMBULATORY_CARE_PROVIDER_SITE_OTHER): Payer: Medicare Other

## 2021-04-08 DIAGNOSIS — Z01818 Encounter for other preprocedural examination: Secondary | ICD-10-CM

## 2021-04-08 NOTE — Interdisciplinary (Signed)
Anesthesia Preparedness Clinic [facility]) RN PHONE CALL NOTE    Phone call to patient from [facility] RN today.     Confirmed medical history & that medications listed in Epic are accurate and up to date.     Patient scheduled for procedure 04/22/2021 at [facility].    Confirmed preoperative instructions with patient including NPO instructions.  Discussed pt with [facility] anesthesia (Dr Lennette Bihari) as pt requesting NPO instructions to be modified.  Pt to have nothing solid to eat after midnight and clear liquids after midnight.  Pt to hold clear liquids 2 hours prior to procedure.  Instructions sent via mychart.    Planning your surgery information sent to patient via mychart.    No questions or concerns at this time.

## 2021-04-08 NOTE — Patient Instructions (Signed)
PREOPERATIVE SURGICAL INFORMATION     Your procedure is currently scheduled at [facility] on 04/22/2021  The scheduler will be contacting you with the check in time                                                                         [facility], [place], [place], [place], [place] [address]    Please check in at [employer], [place], 1st floor.     PARKING    For [employer]:   [employer] manager (at the end of [address]) or in [phone] (at [employer] and [place]: [place] parking is available between 8 a.m. and 4:30 p.m. weekdays at the main entrance to the [employer] on [address]. The cost is the same as self parking. With a handicapped placard, [place] parking is free.   [email or web address]       QUESTIONS    If you have any questions between now and the day of your surgery, please do not hesitate to call:     [facility]: [phone]     DAY OF SURGERY ARRIVAL TIME:    On the day of your Surgery/Procedure, please arrive at the time provided by the surgery/preop team. If you have any questions regarding your arrival time, please call:     Preoperative Surgical Admissions at [facility]: [place]:       MEDICATION INSTRUCTIONS BEFORE SURGERY/PROCEDURE:    OK to take your regular morning prescription medications as scheduled with a small sip of water on the morning of procedure.    PLEASE HOLD ALL NSAIDS (non-steroidal anti-inflammatory drugs) SUCH AS advil, aleve, motrin, ibuprofen, relafen, lodine, feldene, Diclofenac, voltaren, indomethacin, naproxen, celebrex, Mobic 7 days before surgery.       Please hold vitamins, supplements, herbs & fish oil 7 days before surgery.     It is OK to take acetaminophen (Tylenol) for pain around the time of surgery unless you have liver disease.      AFTER YOUR VISIT WITH  [name], IF YOU START TAKING A NEW MEDICATION BEFORE SURGERY, PLEASE CALL us TO MAKE SURE IT IS SAFE TO TAKE & WILL NOT AFFECT YOUR SURGERY.         OSA INSTRUCTIONS:     KOP/HC/JMC: If you use a CPAP machine, please bring the entire machine, including mask and tubing, with you on the day of surgery.       EATING/DRINKING     PLEASE DO NOT EAT ANY SOLID FOODS AFTER MIDNIGHT THE NIGHT BEFORE PROCEDURE.   You may continue to drink CLEAR liquids up until 2 hours prior to your scheduled procedure start time. A list of drinks you CAN have includes:   Black coffee -- NO cream, NO sugar   Apple Juice   Water   Cranberry Juice   Please only choose drinks from this list. Everything else is NOT allowed.     Preparing for your Surgery:     Please wear clean loose-fitting clothes and leave valuables at home   Do not shave or remove  body hair. Facial shaving is permitted. If you are having head surgery, ask your doctor whether you can shave.  Bring a picture ID and your insurance card, and be prepared to pay your deductible or co-insurance by cash, check, or credit card when you arrive.   If you are going home after your surgery, please make sure to arrange for an adult to drive you home. You CANNOT use UBER or LYFT. If you do not have a ride, your surgery may be cancelled.       On The Day of Your Surgery:      Check in at the location mentioned above   If you are a woman of child bearing age, please note that you may be asked to give a urine sample upon check-in  You will meet your anesthesia and surgery teams in the preoperative holding area before surgery.   Once surgery is over, you will wake up in the recovery room.  If you go home, an adult chaperone will need to stay with you for the first 24 hours after surgery.   Visitor policy during the EVOJJ-00 pandemic is subject to change. Current visitor policy can be found at [email or web address]      A video about what to expect for the day of surgery  can be found here:    [email or web address]  Or by searching You-tube for [place] before surgery and [place] after surgery     You medical records are available to you at [email or web address] click sign up now.

## 2021-04-09 ENCOUNTER — Ambulatory Visit (HOSPITAL_BASED_OUTPATIENT_CLINIC_OR_DEPARTMENT_OTHER): Payer: Medicare Other

## 2021-04-14 ENCOUNTER — Ambulatory Visit (HOSPITAL_BASED_OUTPATIENT_CLINIC_OR_DEPARTMENT_OTHER): Payer: Medicare Other | Admitting: Urology

## 2021-04-15 ENCOUNTER — Other Ambulatory Visit: Payer: Self-pay

## 2021-04-20 ENCOUNTER — Ambulatory Visit (HOSPITAL_BASED_OUTPATIENT_CLINIC_OR_DEPARTMENT_OTHER): Admit: 2021-04-20 | Payer: Medicare Other

## 2021-04-21 ENCOUNTER — Other Ambulatory Visit: Payer: Self-pay

## 2021-04-21 NOTE — Anesthesia Preprocedure Evaluation (Addendum)
ANESTHESIA PRE-OPERATIVE EVALUATION    Patient Information    Name: Stephen [name]    MRN: [id number]    DOB: 1951/08/17    Age: 70 year old    Sex: male  Procedure(s):  CT WITH ANESTHESIA UROGRAPHY      Pre-op Vitals:   There were no vitals taken for this visit.        Primary language spoken:  English    ROS/Medical History:      History of Present Illness: Stephen [name] is a 71 year old male with congenital hydronephrosis at 70 yo, TURP and recurrent BNC previously managed with [facility], then SPT, now s/p DVIU with [facility] 12/07/2017. Hx of extensive lumbar-sacral spinal fusion.    Pt now s/f CT with anesthesia urography.    General:  positive for Obesity,  able to climb flight of stairs/Exercise tolaerance >4 mets,  able to dress/bathe self,   Cardiovascular:  no CAD/Angina/MI/CABG/Stents, Anti-platelet drugs: No,   no LV failure/CHF,   hypertension,  no pacemaker,  no ICD,  Walks to house and yard, stairs 2/2 back pain he is limited. Denies SOB or C/P.    Baseline HR 40-50's on monitor.   Anesthesia History:  negative anesthesia history ROS  no history of anesthetic complications,  no PONV,  chronic pain patient,  no family history of anesthetic complications,  Previous Grade III needed [facility] Pulmonary:   negative pulmonary ROS  no COPD,  no sleep apnea,     Neuro/Psych:   negative for TIA/CVA,  no seizures,  psychiatric history,  Multiple spinal surgeries; Pt ambulatory, used to use a cain for assistance. Hematology/Oncology:   anemia (on past labs),  no history of cancer,      GI/Hepatic:  GERD,  no liver disease,   Infectious Disease:  no hepatitis,  no HIV,     Renal:  H/o urethral stricture and TURP in the past. Hx of prior nephrectomy 2/2 congenital hydronephrosis.     BLE edematous and erythemic.  Endocrine/Other:  no diabetes,  arthritis,   back pain,  Hx of lumbar-sacral spinal fusion; multiple back surgeries.   Pregnancy History:   Pediatrics:         Pre Anesthesia Testing (PCC/CPC) notes/comments:                  Physical Exam    Airway:      Comment: Pt previously intubated with a glidescope for spinal surgery; G1 view per notes. Inter-inciser distance 3-4 cm    Mallampati: III  Neck ROM: limited  TM distance: 5-6 cm  Short thick neck: No          Cardiovascular:  - cardiovascular exam normal   Rhythm: regular   Rate: normal         Pulmonary:  - pulmonary exam normal           Neuro/Neck/Skeletal/Skin:    Comment: psoriasis  Positive for Skin abnormalities and Skeletal abnormalities       Dental:    Comment: Multiple missing; poor dentition      Abdominal:      General: obesity     Additional Clinical Notes:               Last  OSA (STOP BANG) Score:  No data recorded    Last OSA  (STOP) Score for   Has a physician diagnosed you with sleep apnea?: No  Do you use a CPAP at home?: No  Past Medical History:   Diagnosis Date    Chronic suprapubic catheter (CMS-HCC)     Congenital hydronephrosis     Gout     Headache     Hematuria     Kidney disease     Kidney stones     Major depressive disorder, single episode     Polyarthropathy or polyarthritis of multiple sites     Retinal detachment     Urethral stricture      Past Surgical History:   Procedure Laterality Date    CT INSERTION OF SUPRAPUBIC CATH  09/25/2015    NEPHRECTOMY Right 1995    APPENDECTOMY      COLONOSCOPY      CYSTOSCOPY      CYSTOSCOPY W/ LASER LITHOTRIPSY      OTHER SURGICAL HISTORY      Interstim 01/29/2011    TRANSURETHRAL RESECTION OF PROSTATE       Social History     Socioeconomic History    Marital status: Single   Tobacco Use    Smoking status: Never Smoker    Smokeless tobacco: Never Used   Substance and Sexual Activity    Alcohol use: No    Drug use: Not Currently    Sexual activity: Not Currently     Partners: Female   Other Topics Concern    Military Service No    Blood Transfusions Yes    Caffeine Concern No    Occupational Exposure No    Hobby Hazards No    Sleep Concern Yes     Comment:  due to my lower back discomfort and leg discomfort    Stress Concern No    Weight Concern No    Special Diet No    Back Care Yes     Comment: Am very careful with any activity    Exercises Regularly Yes    Seat Belt Use Yes    Performs Self-Exams Yes     Alcohol Use: Not on file       No current facility-administered medications for this encounter.     Current Outpatient Medications   Medication Sig Dispense Refill    acetaminophen (TYLENOL) 325 MG tablet Take 3 tablets (975 mg) by mouth every 8 hours. 130 tablet 0    ALAWAY 0.025 % ophthalmic solution INSTILL 1 DROP INTO BOTH EYES TWICE A DAY AS DIRECTED      allopurinol (ZYLOPRIM) 300 MG tablet Take 300 mg by mouth daily.      amLODIPINE (NORVASC) 5 MG tablet Take 10 mg by mouth daily.      Cetirizine HCl (ZYRTEC PO) 10 mg.       ciclopirox (LOPROX) 0.77 % GEL Apply topically 2 times daily.      docusate sodium (COLACE) 100 MG capsule Take 2 capsules (200 mg) by mouth at bedtime. 60 capsule 0    omeprazole (PRILOSEC) 20 MG capsule Take 20 mg by mouth daily.      oxybutynin (DITROPAN) 5 MG tablet Take 1 tablet (5 mg) by mouth 3 times daily. 336 tablet 4    Polyethyl Glycol-Propyl Glycol (SYSTANE ULTRA OP)       pregabalin (LYRICA) 100 MG capsule TAKE 1 CAPSULE BY MOUTH DAILY AT BEDTIME 30 capsule 5    pregabalin (LYRICA) 75 MG capsule TAKE 1 CAPSULE BY MOUTH EVERY MORNING 30 capsule 5    senna (SENOKOT) 8.6 MG tablet Take 2 tablets (17.2 mg) by mouth every morning. 15 tablet 0  triamcinolone (KENALOG) 0.1 % cream Apply 1 Application topically 2 times daily. Apply a thin layer as directed      VITAMIN D PO Take 500 mg by mouth daily.       zolpidem (AMBIEN) 5 MG tablet Take 1 tablet (5 mg) by mouth nightly as needed for Insomnia. 30 tablet 0     Allergies   Allergen Reactions    Amoxicillin Rash    Sulfa Drugs Unspecified       Labs and Other Data  Lab Results   Component Value Date    NA 142 03/10/2021    K 4.6 03/10/2021    CL 104  03/10/2021    BICARB 25 03/10/2021    BUN 18 03/10/2021    CREAT 1.19 (H) 03/10/2021    GLU 95 03/10/2021    [place] 9.7 03/10/2021     Lab Results   Component Value Date    AST 27 08/18/2020    ALT 9 08/18/2020    ALK 64 08/18/2020    TP 6.8 08/18/2020    ALB 4.7 08/18/2020    TBILI 1.00 08/18/2020     Lab Results   Component Value Date    WBC 15.0 (H) 08/21/2020    RBC 3.73 (L) 08/21/2020    HGB 11.2 (L) 08/21/2020    HCT 33.4 (L) 08/21/2020    MCV 89.5 08/21/2020    MCHC 33.5 08/21/2020    RDW 14.9 (H) 08/21/2020    PLT 166 08/21/2020    MPV 11.2 08/21/2020    SEG 89 08/18/2020    LYMPHS 6 08/18/2020    MONOS 5 08/18/2020    EOS 0 08/18/2020    BASOS 0 08/18/2020     Lab Results   Component Value Date    INR 1.2 08/18/2020    PTT 28 08/18/2020     Lab Results   Component Value Date    ARTPH 7.42 08/18/2020    ARTPO2 218 (H) 08/18/2020    ARTPCO2 35 (L) 08/18/2020       Anesthesia Plan:  Risks and Benefits of Anesthesia  I have personally performed an appropriate pre-anesthesia physical exam of the patient (including heart, lungs, and airway) prior to the anesthetic and reviewed the pertinent medical history, drug and allergy history, laboratory and imaging studies and consultations.   I have determined that the patient has had adequate assessment and testing.  I have validated the documentation of these elements of the patient exam and/or have made necessary changes to reflect my own observations during my pre-anesthesia exam.  Anesthetic techniques, invasive monitors, anesthetic drugs for induction, maintenance and post-operative analgesia, risks and alternatives have been explained to the patient and/or patient's representatives.    I have prescribed the anesthetic plan:         Planned anesthesia method: Monitored Anesthesia Care         ASA 3 (Severe systemic disease)     Potential anesthesia problems identified and risks including but not limited to the following were discussed with patient and/or patient's  representative: Adverse or allergic drug reaction, Recall, Ocular injury, Dental injury or sore throat, Injury to brain, heart and other organs and Death    No Beta Blocker Indicated: Does not meet criteria and Patient not on beta blockers    Planned monitoring method: Routine monitoring    Informed Consent:  Anesthetic plan and risks discussed with Patient.    Plan discussed with CRNA, Attending, OR Nurse and Surgeon.

## 2021-04-22 ENCOUNTER — Ambulatory Visit (HOSPITAL_BASED_OUTPATIENT_CLINIC_OR_DEPARTMENT_OTHER)
Admission: RE | Admit: 2021-04-22 | Discharge: 2021-04-22 | Disposition: A | Discharge status: Home Health | Payer: Medicare Other | Attending: Urology | Admitting: Urology

## 2021-04-22 ENCOUNTER — Ambulatory Visit
Admission: RE | Admit: 2021-04-22 | Discharge: 2021-04-22 | Disposition: A | Discharge status: Home / Self Care | Payer: Medicare Other | Attending: Anesthesiology | Admitting: Anesthesiology

## 2021-04-22 ENCOUNTER — Ambulatory Visit (HOSPITAL_BASED_OUTPATIENT_CLINIC_OR_DEPARTMENT_OTHER): Payer: Medicare Other | Admitting: Certified Registered Nurse Anesthetist

## 2021-04-22 ENCOUNTER — Encounter (HOSPITAL_BASED_OUTPATIENT_CLINIC_OR_DEPARTMENT_OTHER): Admission: RE | Disposition: A | Discharge status: Home Health | Payer: Self-pay | Attending: Anesthesiology

## 2021-04-22 DIAGNOSIS — E669 Obesity, unspecified: Secondary | ICD-10-CM

## 2021-04-22 DIAGNOSIS — Z905 Acquired absence of kidney: Secondary | ICD-10-CM

## 2021-04-22 DIAGNOSIS — Z9049 Acquired absence of other specified parts of digestive tract: Secondary | ICD-10-CM | POA: Insufficient documentation

## 2021-04-22 DIAGNOSIS — M549 Dorsalgia, unspecified: Secondary | ICD-10-CM

## 2021-04-22 DIAGNOSIS — L409 Psoriasis, unspecified: Secondary | ICD-10-CM | POA: Insufficient documentation

## 2021-04-22 DIAGNOSIS — Z880 Allergy status to penicillin: Secondary | ICD-10-CM | POA: Insufficient documentation

## 2021-04-22 DIAGNOSIS — K219 Gastro-esophageal reflux disease without esophagitis: Secondary | ICD-10-CM

## 2021-04-22 DIAGNOSIS — Z9079 Acquired absence of other genital organ(s): Secondary | ICD-10-CM | POA: Insufficient documentation

## 2021-04-22 DIAGNOSIS — I10 Essential (primary) hypertension: Secondary | ICD-10-CM

## 2021-04-22 DIAGNOSIS — Z79899 Other long term (current) drug therapy: Secondary | ICD-10-CM | POA: Insufficient documentation

## 2021-04-22 DIAGNOSIS — N2889 Other specified disorders of kidney and ureter: Secondary | ICD-10-CM

## 2021-04-22 DIAGNOSIS — N1330 Unspecified hydronephrosis: Secondary | ICD-10-CM

## 2021-04-22 DIAGNOSIS — Z981 Arthrodesis status: Secondary | ICD-10-CM | POA: Insufficient documentation

## 2021-04-22 DIAGNOSIS — Z882 Allergy status to sulfonamides status: Secondary | ICD-10-CM | POA: Insufficient documentation

## 2021-04-22 DIAGNOSIS — M1990 Unspecified osteoarthritis, unspecified site: Secondary | ICD-10-CM

## 2021-04-22 DIAGNOSIS — D649 Anemia, unspecified: Secondary | ICD-10-CM

## 2021-04-22 SURGERY — CT WITH ANESTHESIA
Anesthesia: Monitored Anesthesia Care (MAC)

## 2021-04-22 MED ORDER — IOHEXOL 350 MG/ML IV SOLN
100.0000 mL | Freq: Once | INTRAVENOUS | Status: AC
Start: 2021-04-22 — End: 2021-04-22
  Administered 2021-04-22: 100 mL via INTRAVENOUS
  Filled 2021-04-22: qty 100

## 2021-04-22 MED ORDER — SODIUM CHLORIDE 0.9 % IV SOLN
INTRAVENOUS | Status: DC | PRN
Start: 2021-04-22 — End: 2021-04-22

## 2021-04-22 MED ORDER — MIDAZOLAM HCL 2 MG/2ML IJ SOLN
INTRAMUSCULAR | Status: DC | PRN
Start: 2021-04-22 — End: 2021-04-22
  Administered 2021-04-22 (×4): 2 mg via INTRAVENOUS

## 2021-04-22 NOTE — Anesthesia Postprocedure Evaluation (Signed)
Anesthesia Post Note    Patient: Stephen [name]    Procedure(s) Performed: Procedure(s):  CT WITH ANESTHESIA UROGRAPHY      Final anesthesia type: Monitored Anesthesia Care    Patient location: PACU    Post anesthesia pain: adequate analgesia    Mental status: awake, alert  and oriented    Airway Patent: Yes    Last Vitals:   Vitals Value Taken Time   BP 135/71 04/22/21 1717   Temp 36.5 04/22/21 1717   Pulse 52 04/22/21 1717   Resp 14 04/22/21 1717   SpO2 98 04/22/21 1717        Post vital signs: stable    Hydration: adequate    N/V:no    Anesthetic complications: no    Plan of care per primary team.

## 2021-04-22 NOTE — Progress Notes (Signed)
All monitoring and airway management done by CRNA Arline Asp

## 2021-04-27 NOTE — Progress Notes (Signed)
Department of Urology  Clinic Note    Referring Provider: Jolaine [name]  Chief complaint: No chief complaint on file.      IMPRESSION AND TREATMENT PLAN   Stephen [name] is a 70 year old male with congenital hydronephrosis at 70 yo, TURP and recurrent BNC previously managed with [facility], then SPT, now s/p DVIU with [facility] 12/07/2017:    #BNC 2/2 TURP s/p BNI with [facility] (12/07/2017)  -IPSS  -Open on last cysto 12/2018  -Able to void 542ml with PVR 5ml. Could perform timed voiding Q6 hours with additional voids during the day and before bed, as well as other behavioral management including elevating legs early in the evening to mobilize fluid.  -Cysto - patent BN    #Solitary left kidney, hydronephrosis 2/2 enhancing filling defect (2.2 cm) concerning for Stephen [name].    -Urologic Oncology referral; discussed with Dr. Sallyanne [name]    #Gross hematuria with right anterior bladder wall thickening and enhancing renal pelvic mass (2.2 cm)  -CTU complete   -Cytology sent  -UA and BMP today  -Cysto with asymmetric bladder, unable to completely evaluate right posterior; he will need a complete repeat cysto at the time of his URS    RTC: Urologic Oncology Referral for enhancing renal pelvis mass    All of the patient's questions were answered to satisfaction and completion.      Stephen Sabin, MD  04/27/2021 8:33 PM         HISTORY OF PRESENT ILLNESS   Stephen [name] is a 70 year old male with a history of solitary left kidney after right nephrectomy for congenital hydronephrosis at 70 yo, TURP and recurrent BNC previously managed with [facility], then SPT, now s/p DVIU with [facility] 12/07/2017 with Dr. Lucia [name], was seen in ED 06/24/2018 due to hematuria, clot retention, hand irrigated through SPT.    Interval history:  Has lower back spasms. H/o spinal surgery. No new pain in left flank.   Voids ~1317ml in the morning. Another void with 400-541ml. Gets urge to void in the middle of the night but does not get up.  Unsure how much he drinks.  Has lymphedema in his legs. Wears compression stockings sometimes.  Denies UTIs, f/c/n/v, dysuria, gross hematuria. Feels he empties all the way.    Creatinine 08/19/20: 1.02        Hepatomegaly with hepatic steatosis.    Interval-  Newly identified left renal pelvic enhancing mass.      IPSS Form--patient completed  [place] [facility] IPSS REVIEW FS 04/21/2021 01/01/2019 04/26/2018   Incomplete Emptying 0 0 0   Frequency 0 0 0   Intermittencey 0 0 0   Urgency 0 0 0   Weak Stream 0 0 0   Straining 5 1 1    Nocturia 0 1 0   IPSS Total Score 5 2 1    Quality of Life 3 3 2        No flowsheet data found.    REVIEW OF SYSTEMS         PATIENT HISTORY     Past Medical History:   Diagnosis Date    Chronic suprapubic catheter (CMS-HCC)     Congenital hydronephrosis     Gout     Headache     Hematuria     Kidney disease     Kidney stones     Major depressive disorder, single episode     Polyarthropathy or polyarthritis of multiple sites     Retinal detachment  Urethral stricture      Past Surgical History:   Procedure Laterality Date    CT INSERTION OF SUPRAPUBIC CATH  09/25/2015    NEPHRECTOMY Right 1995    APPENDECTOMY      COLONOSCOPY      CYSTOSCOPY      CYSTOSCOPY W/ LASER LITHOTRIPSY      OTHER SURGICAL HISTORY      Interstim 01/29/2011    TRANSURETHRAL RESECTION OF PROSTATE         FAMILY HISTORY     Family History   Adopted: Yes   Family history unknown: Yes       SOCIAL HISTORY     Social History     Tobacco Use    Smoking status: Never Smoker    Smokeless tobacco: Never Used   Substance Use Topics    Alcohol use: No       ALLERGIES     Allergies   Allergen Reactions    Amoxicillin Rash    Sulfa Drugs Unspecified       PHYSICAL EXAMINATION      There were no vitals taken for this visit.     Physical exam chaperone offered by staff : declined     Exam:   General: No acute distress  Neuro:  Alert, oriented  Head: Normocephalic, atraumatic  Neck: Midline trachea  Chest: Nonlabored, symmetric  CV: Regular rate  GI:  Soft, nontender, nondistended  Back: no CVAT  GU: Meatus patent   DRE deferred   Ext: mild peripheral edema  Skin: No obvious rashes, no lesions  Psych: Appropriate mood      PROCEDURES     Cystoscopy 07/31/2018              Trabeculation: Moderate-Severe              Stones:            None              Diverticuli:       Yes   Open BN- able to take the scope without resistance. SPT seen in the bladder    Flow study 07/31/2018  FR=13cc/sec  VOL=288cc  PVR=5cc  Curve= no sign of obstruction    Cystoscopy 01/01/2019    The patient was prepped and draped in the usual sterile fashion. Lidocaine gel was injected into the urethra. A 67fr flexible cystoscope was introduced into the meatus.    Findings:  Meatus:                       Normal    Urethra              Anterior:          Normal              Membranous:  Normal              Prostatic:         Patent repair    Bladder              Mucosa:          Normal              Trabeculation: severe              Stones:            None              Diverticuli:  yes    Patient tolerated the procedure without difficulty. Findings were discussed with the patient.    Flow study  03/10/2021  Maximum flow: 33.3 ml/sec  Average flow:  22.2 mL/sec  Total volume voided: 501 ml  PVR:   5 ml  Pattern:  Plateau    Cystoscopy 04/28/21  Consent was obtained for an office cystoscopy. A time out was performed to confirm the MRN and procedure. The patient was prepped and draped in the usual sterile fashion. 2% lidocaine gel was inserted into the urethra. A 17 fr flexible cystoscope was inserted into the meatus.     Findings:  Meatus- normal, no sign of LS  Penile urethra- normal  Bulbar urethra- normal  Membranous urethra- normal  Prostatic urethra- normal with open ~20 Fr bladder neck ring  Bladder mucosa- no obvious mucosal abnormalities, but difficult to examine completely due to patient not tolerating well, moderate trabeculations  Will need a  Repeat Cysto at the time of URS  to fully examine the bladder     Patient tolerated the procedure without difficulty.      LABORATORY STUDIES     Lab Results   Component Value Date    NA 142 03/10/2021    K 4.6 03/10/2021    CL 104 03/10/2021    BUN 18 03/10/2021    CREAT 1.19 (H) 03/10/2021    GFRNON >60 03/10/2021    [place] 9.7 03/10/2021       Lab Results   Component Value Date    WBC 15.0 (H) 08/21/2020    HGB 11.2 (L) 08/21/2020    HCT 33.4 (L) 08/21/2020    MCV 89.5 08/21/2020    PLT 166 08/21/2020       No results found for: PSA    IMAGING   The following studies were personally reviewed with findings as described:    RBUS 05/09/17:   LK: 13.8cm  Mild hydronephrosis    Abd [name] 01/11/21  IMPRESSION:    Marked left-sided hydronephrosis with debris in the renal collecting system. Please correlate for superimposed infection.     Hepatomegaly with hepatic steatosis.    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    Moderate left ureteropelvic junctional and mild right anterior bladder wall urothelial thickening and irregularity, which are indeterminate, but may also represent additional sites of urothelial disease.    Moderate left hydronephrosis.    Status post right nephrectomy.    No evidence of metastatic disease in the abdomen or pelvis outside the urothelial tract.                  HOME MEDICATIONS     Current Outpatient Medications on File Prior to Visit   Medication Sig Dispense Refill    acetaminophen (TYLENOL) 325 MG tablet Take 3 tablets (975 mg) by mouth every 8 hours. 130 tablet 0    ALAWAY 0.025 % ophthalmic solution INSTILL 1 DROP INTO BOTH EYES TWICE A DAY AS DIRECTED      allopurinol (ZYLOPRIM) 300 MG tablet Take 300 mg by mouth daily.      amLODIPINE (NORVASC) 5 MG tablet Take 10 mg by mouth daily.      Cetirizine HCl (ZYRTEC PO) 10 mg.       ciclopirox (LOPROX) 0.77 % GEL Apply topically 2 times  daily.      docusate sodium (  COLACE) 100 MG capsule Take 2 capsules (200 mg) by mouth at bedtime. 60 capsule 0    omeprazole (PRILOSEC) 20 MG capsule Take 20 mg by mouth daily.      oxybutynin (DITROPAN) 5 MG tablet Take 1 tablet (5 mg) by mouth 3 times daily. 336 tablet 4    Polyethyl Glycol-Propyl Glycol (SYSTANE ULTRA OP)       pregabalin (LYRICA) 100 MG capsule TAKE 1 CAPSULE BY MOUTH DAILY AT BEDTIME 30 capsule 5    pregabalin (LYRICA) 75 MG capsule TAKE 1 CAPSULE BY MOUTH EVERY MORNING 30 capsule 5    senna (SENOKOT) 8.6 MG tablet Take 2 tablets (17.2 mg) by mouth every morning. 15 tablet 0    triamcinolone (KENALOG) 0.1 % cream Apply 1 Application topically 2 times daily. Apply a thin layer as directed      VITAMIN D PO Take 500 mg by mouth daily.       zolpidem (AMBIEN) 5 MG tablet Take 1 tablet (5 mg) by mouth nightly as needed for Insomnia. 30 tablet 0     No current facility-administered medications on file prior to visit.

## 2021-04-28 ENCOUNTER — Other Ambulatory Visit (INDEPENDENT_AMBULATORY_CARE_PROVIDER_SITE_OTHER): Payer: Medicare Other

## 2021-04-28 ENCOUNTER — Ambulatory Visit (HOSPITAL_BASED_OUTPATIENT_CLINIC_OR_DEPARTMENT_OTHER): Payer: Medicare Other | Admitting: Urology

## 2021-04-28 ENCOUNTER — Encounter (HOSPITAL_BASED_OUTPATIENT_CLINIC_OR_DEPARTMENT_OTHER): Payer: Self-pay | Admitting: Student in an Organized Health Care Education/Training Program

## 2021-04-28 ENCOUNTER — Ambulatory Visit: Payer: Medicare Other | Attending: Urology | Admitting: Student in an Organized Health Care Education/Training Program

## 2021-04-28 VITALS — BP 136/67 | HR 58 | Temp 98.5°F | Resp 16 | Ht 67.0 in | Wt 206.0 lb

## 2021-04-28 DIAGNOSIS — N2889 Other specified disorders of kidney and ureter: Secondary | ICD-10-CM | POA: Insufficient documentation

## 2021-04-28 DIAGNOSIS — N320 Bladder-neck obstruction: Secondary | ICD-10-CM | POA: Insufficient documentation

## 2021-04-28 DIAGNOSIS — R310 Gross hematuria: Secondary | ICD-10-CM

## 2021-04-28 LAB — URINALYSIS WITH CULTURE REFLEX, WHEN INDICATED
Bilirubin: NEGATIVE
Glucose: NEGATIVE
Ketones: NEGATIVE
Leuk Esterase: NEGATIVE Leu/uL
Nitrite: NEGATIVE
Protein: NEGATIVE
Specific Gravity: 1.013 (ref 1.002–1.030)
Urobilinogen: NEGATIVE
pH: 5.5 (ref 5.0–8.0)

## 2021-04-28 LAB — BASIC METABOLIC PANEL, BLOOD
Anion Gap: 13 mmol/L (ref 7–15)
BUN: 21 mg/dL (ref 8–23)
Bicarbonate: 24 mmol/L (ref 22–29)
Calcium: 10 mg/dL (ref 8.5–10.6)
Chloride: 102 mmol/L (ref 98–107)
Creatinine: 1.25 mg/dL — ABNORMAL HIGH (ref 0.67–1.17)
GFR: 58 mL/min
Glucose: 96 mg/dL (ref 70–99)
Potassium: 4.3 mmol/L (ref 3.5–5.1)
Sodium: 139 mmol/L (ref 136–145)
eGFR Based on CKD-EPI 2021 Equation: >60 mL/min

## 2021-04-28 MED ORDER — LIDOCAINE HCL 2% EX GEL (UROJET)
10.0000 mL | Freq: Once | Status: AC
Start: 2021-04-28 — End: 2021-04-28
  Administered 2021-04-28 (×2): 10 mL via URETHRAL

## 2021-04-28 MED ORDER — LIDOCAINE HCL 2% EX GEL (UROJET)
Status: AC
Start: 2021-04-28 — End: ?
  Filled 2021-04-28: qty 10

## 2021-04-28 NOTE — Interdisciplinary (Signed)
Patient was seen in clinic today by Dr. Bronson Curb. AVS instructions were provided.    Plan:  1. We will submit a urine sample today for urinalysis and cytology   Results for cytology will typically take about 1 week  2. Complete BMP bloodwork today   3. Call radiology and schedule CT scan at [phone]  4. You have been referred to [facility]     Patient verbalized understanding and agrees with the plan.

## 2021-04-28 NOTE — Interdisciplinary (Signed)
PVR Result: 107ml  Catheter: no  Ultrasound: yes

## 2021-04-28 NOTE — Patient Instructions (Addendum)
Plan:   We will submit a urine sample today for urinalysis and cytology   Results for cytology will typically take about 1 week  2. Complete BMP bloodwork today   3. Call radiology and schedule CT scan at [phone]  4. You have been referred to Dr.Bagrodia     Arvil Persons, MD     Mailing Address:  [facility], Mail [address]: [address]  [place], [place] [address]     Clinic Address:  [facility]  Urology Department  [address] [place], [place]  [place], [place] [address]  Appointment Line # [phone]        Executive Assistant/Surgery Coordinator  Inocencio Homes  Office number # [phone]          Clinic Hours: Monday-Friday 8:00am-5:00pm   (Closed Weekends and Holidays)     AFTER HOURS EMERGENCY NUMBER # [phone]  (Ask for the Urologist On-Call)

## 2021-04-28 NOTE — Interdisciplinary (Signed)
0955 Patient placed in room tx #4 for Cystoscopy  1010 prep completed using sterile technique, 2% lidocaine inserted into the urethra pt tolerated well  1024 MD arrived, time out called  1025 START  1032 END    Time out of room 1045

## 2021-04-29 ENCOUNTER — Telehealth (HOSPITAL_BASED_OUTPATIENT_CLINIC_OR_DEPARTMENT_OTHER): Payer: Self-pay | Admitting: Urology

## 2021-04-29 ENCOUNTER — Other Ambulatory Visit: Payer: Self-pay

## 2021-04-29 ENCOUNTER — Encounter (HOSPITAL_BASED_OUTPATIENT_CLINIC_OR_DEPARTMENT_OTHER): Payer: Self-pay | Admitting: Urology

## 2021-04-29 DIAGNOSIS — N2889 Other specified disorders of kidney and ureter: Secondary | ICD-10-CM

## 2021-04-29 MED ORDER — GABAPENTIN 100 MG OR CAPS
100.0000 mg | ORAL_CAPSULE | Freq: Three times a day (TID) | ORAL | 0 refills | Status: DC
Start: 2021-04-29 — End: 2021-06-25

## 2021-04-29 MED ORDER — PHENAZOPYRIDINE HCL 100 MG OR TABS
100.0000 mg | ORAL_TABLET | Freq: Three times a day (TID) | ORAL | 0 refills | Status: AC
Start: 2021-04-29 — End: 2021-05-06

## 2021-04-29 NOTE — Telephone Encounter (Signed)
SURGERY: Diagnostic Ureteroscopy with laser, possible biopsy, ureteral stent placement, gemcitabine instillation, possible TURBT    Surgeon: Dr. Baron Hamper with patient - scheduled surgery at [facility] and provided plan below.    Reviewed pre op instructions with patient - NPO after midnight,Transportation with family/friend after discharge     Reviewed [facility] visitor policy - provide proof of vaccination or negative COVID test within 72 hours of the visit    Pt verbalized understanding    SURGERY PLAN  DATE - 05/18  CHECK IN - 6:20 AM  APC - 05/16  COVID-19 test - not needed  POV - 05/31  [Patient aware surgery time is subject to change]    QUESTIONS  Sleep Apnea - no  Pacemaker/Cardiac Issues - no  Catheter/Port/NT - no  Blood Thinners -  no  Smoking? - no   [Patient aware to discontinue any blood thinning medications/vitamins 7 days prior to surgery]    Hospitalized w/in 3 months - no  Able to walk to walk up 2 flight of stairs - yes  Difficulty w/ anesthesia or airway - no      Urine Culture - to be completed w/in 30 days - completed on 05/11    Is Medical Clearance needed - not needed    Surgery Letter - to be sent via MyChart      Surgical Consent - media     Auth:  Will request as time gets closer per auth teams request.       *NOTES IF ANY

## 2021-04-30 ENCOUNTER — Other Ambulatory Visit (HOSPITAL_BASED_OUTPATIENT_CLINIC_OR_DEPARTMENT_OTHER): Payer: Self-pay | Admitting: Urology

## 2021-05-03 ENCOUNTER — Ambulatory Visit (INDEPENDENT_AMBULATORY_CARE_PROVIDER_SITE_OTHER): Payer: Medicare Other

## 2021-05-03 ENCOUNTER — Encounter (INDEPENDENT_AMBULATORY_CARE_PROVIDER_SITE_OTHER): Payer: Self-pay

## 2021-05-03 DIAGNOSIS — Z01818 Encounter for other preprocedural examination: Secondary | ICD-10-CM

## 2021-05-03 NOTE — Interdisciplinary (Signed)
Anesthesia Preparedness Clinic [facility]) RN PHONE CALL NOTE    Phone call to patient from [facility] RN today.     Confirmed medical history & that medications listed in Epic are accurate and up to date.     Patient scheduled for surgery on 05/05/21 at [facility]    Confirmed preoperative instructions with patient including NPO instructions.     Planning your surgery information sent to patient via mychart    No questions or concerns at this time.

## 2021-05-03 NOTE — H&P (Addendum)
Urology H&P    Patient Name: Stephen [name]  MRN: [id number]    Date: 05/03/21        HPI: Stephen [name] is a 70 year old male that presents for surgery.    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 Could perform timed voiding Q6 hours with additional voids during the day and before bed, as well as other behavioral management including elevating legs early in the evening to mobilize fluid.  -Cysto - patent BN    #Solitary left kidney, hydronephrosis 2/2 enhancing filling defect (2.2 cm) concerning for [place].      #Gross hematuria with right anterior bladder wall thickening and enhancing renal pelvic mass (2.2 cm)  -Cysto with asymmetric bladder, unable to completely evaluate right posterior; he will need a complete repeat cysto at the time of his URS    UA 5/11 neg for UTI; 11-20RBC  Cytology 5/11 atypia    Past Medical History:   Diagnosis Date    Chronic suprapubic catheter (CMS-HCC)     Congenital hydronephrosis     Gout     Headache     Hematuria     Kidney disease     Kidney stones     Major depressive disorder, single episode     Polyarthropathy or polyarthritis of multiple sites     Retinal detachment     Urethral stricture        Past Surgical History:   Procedure Laterality Date    CT INSERTION OF SUPRAPUBIC CATH  09/25/2015    NEPHRECTOMY Right 1995    APPENDECTOMY      COLONOSCOPY      CYSTOSCOPY      CYSTOSCOPY W/ LASER LITHOTRIPSY      OTHER SURGICAL HISTORY      Interstim 01/29/2011    TRANSURETHRAL RESECTION OF PROSTATE         Medications:  No current facility-administered medications on file prior to encounter.     Current Outpatient Medications on File Prior to Encounter   Medication Sig Dispense Refill    acetaminophen (TYLENOL) 325 MG tablet  Take 3 tablets (975 mg) by mouth every 8 hours. 130 tablet 0    ALAWAY 0.025 % ophthalmic solution INSTILL 1 DROP INTO BOTH EYES TWICE A DAY AS DIRECTED      allopurinol (ZYLOPRIM) 300 MG tablet Take 300 mg by mouth daily.      amLODIPINE (NORVASC) 5 MG tablet Take 10 mg by mouth daily.      Cetirizine HCl (ZYRTEC PO) 10 mg.       ciclopirox (LOPROX) 0.77 % GEL Apply topically 2 times daily.      docusate sodium (COLACE) 100 MG capsule Take 2 capsules (200 mg) by mouth at bedtime. 60 capsule 0    gabapentin (NEURONTIN) 100 MG capsule Take 1 capsule (100 mg) by mouth 3 times daily for 7 days. Start taking after your procedure is completed. 21 capsule 0    omeprazole (PRILOSEC) 20 MG capsule Take 20 mg by mouth daily.      [DISCONTINUED] oxybutynin (DITROPAN) 5 MG tablet Take 1 tablet (5 mg) by mouth 3 times daily. 336 tablet 4    phenazopyridine (PYRIDIUM) 100 MG tablet Take 1 tablet (100 mg) by mouth 3 times daily  for 7 days. Start taking after your procedure is completed. 21 tablet 0    Polyethyl Glycol-Propyl Glycol (SYSTANE ULTRA OP)       pregabalin (LYRICA) 100 MG capsule TAKE 1 CAPSULE BY MOUTH DAILY AT BEDTIME 30 capsule 5    pregabalin (LYRICA) 75 MG capsule TAKE 1 CAPSULE BY MOUTH EVERY MORNING 30 capsule 5    senna (SENOKOT) 8.6 MG tablet Take 2 tablets (17.2 mg) by mouth every morning. 15 tablet 0    triamcinolone (KENALOG) 0.1 % cream Apply 1 Application topically 2 times daily. Apply a thin layer as directed      VITAMIN D PO Take 500 mg by mouth daily.       zolpidem (AMBIEN) 5 MG tablet Take 1 tablet (5 mg) by mouth nightly as needed for Insomnia. 30 tablet 0       ROS  General: Denies fatigue, weakness  HEENT: Denies hearing loss, sore throat  CV:  Denies chest pain, palpitations  Chest:  Denies shortness of breath, coughing  GI:  Denies nausea, vomit  GU:  See HPI  MSK:  Denies joint paint, back pain  Skin:  Denies itching, rash  Neuro:  Denies headache, dizziness  Psych:  Denies  insomnia, depression  Endocrine: Denies intolerance to cold, heat  Heme:  Denies anemia, unexplained swollen areas    Vitals:  Pain Score: 9 (05/18 0626)  There were no vitals filed for this visit.      Intake and Output:  Intake/Output     None          Physical Exam:   GENERAL: The patient is an alert, cooperative, no acute distress.   HEAD: normalcephalic/atraumatic  NECK: midline trachea. No jugular venous distention  EYES: no erythema   CV: distal pulses intact  PULM/RESP: normal effort of breathing  GI: soft, NT, ND, no masses.  BACK: no CVAT  EXTREMITES/HEME: Joints are normal without redness or swelling.   SKIN: There is no edema or cyanosis.   NEURO: no motor or sensory deficits. Alert and Oriented x 3  PSYCH: mood appropriate    Labs:  Lab Results   Component Value Date    NA 139 04/28/2021    K 4.3 04/28/2021    CL 102 04/28/2021    BICARB 24 04/28/2021    BUN 21 04/28/2021    CREAT 1.25 (H) 04/28/2021    GLU 96 04/28/2021     10.0 04/28/2021       Lab Results   Component Value Date    WBC 15.0 (H) 08/21/2020    RBC 3.73 (L) 08/21/2020    HGB 11.2 (L) 08/21/2020    HCT 33.4 (L) 08/21/2020    MCV 89.5 08/21/2020    MCHC 33.5 08/21/2020    RDW 14.9 (H) 08/21/2020    PLT 166 08/21/2020    MPV 11.2 08/21/2020       Lab Results   Component Value Date    COLORUA Colorless 04/28/2021    APPEARUA Clear 04/28/2021    GLUCOSEUA Negative 04/28/2021    BILIUA Negative 04/28/2021    KETONEUA Negative 04/28/2021    SGUA 1.013 04/28/2021    BLOODUA 1+ (A) 04/28/2021    PHUA 5.5 04/28/2021    PROTEINUA Negative 04/28/2021    UROBILUA Negative 04/28/2021    NITRITEUA Negative 04/28/2021    LEUKESTUA Negative 04/28/2021    WBCUA 0-2 04/28/2021    RBCUA 11-20 (A) 04/28/2021  Micro:  Microbiology Results (last 30 days)     ** No results found for the last 720 hours. 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 Moderate left ureteropelvic junctional and mild right anterior bladder wall urothelial thickening and irregularity, which are indeterminate, but may also represent additional sites of urothelial disease. Moderate left hydronephrosis. Status post right nephrectomy. No evidence of metastatic disease in the abdomen or pelvis outside the urothelial tract.       ASSESSMENT / RECOMMENDATIONS:  Stephen [name] is a 70 year old male that is presenting for surgery.     - To OR for diagnostic L URS, possible biopsy, possible stent, gemcitabine instillation, possible TURBT      -------------------------------------  Patient seen and discussed with attending.

## 2021-05-03 NOTE — Patient Instructions (Signed)
PREOPERATIVE SURGICAL INFORMATION     Your surgery is currently scheduled at [facility] on 05/05/21  The scheduler will be contacting you with the check in time      [facility], [place], [address] [place], [place] [address]  Please check in at Patient Services in [place] on 1st floor     [facility] (including [facility]): [employer] structure, Microbiologist structure, or Dance movement psychotherapist parking (7am-5pm at [employer]; 5am-5pm at [employer]) for same cost as self-parking in the front entrance of the [facility]   [email or web address]     QUESTIONS    If you have any questions between now and the day of your surgery, please do not hesitate to call:     [facility]: [phone]     DAY OF SURGERY ARRIVAL TIME:    On the day of your Surgery/Procedure, please arrive at the time provided by the surgery/preop team. If you have any questions regarding your arrival time, please call:    Preoperative Surgical Admissions at [facility]:        OK to take the following medications as scheduled with a small sip of water on the morning of surgery: lyrica, amlodipine, allopurinol    PLEASE HOLD ALL NSAIDS (non-steroidal anti-inflammatory drugs) SUCH AS advil, aleve, motrin, ibuprofen, relafen, lodine, feldene, Diclofenac, voltaren, indomethacin, naproxen, celebrex, Mobic 7 days before surgery.       Please hold vitamins, supplements, herbs & fish oil 7 days before surgery.     It is OK to take acetaminophen (Tylenol) for pain around the time of surgery unless you have liver disease.      AFTER YOUR VISIT WITH [name], IF YOU START TAKING A NEW MEDICATION BEFORE SURGERY, PLEASE CALL us TO MAKE SURE IT IS SAFE TO TAKE & WILL NOT AFFECT YOUR SURGERY.         OSA  INSTRUCTIONS:     If you use a CPAP machine, please bring the entire CPAP machine, including mask and tubing, with you on the day of surgery.       EATING/DRINKING        DO NOT EAT OR DRINK ANYTHING AFTER MIDNIGHT ON THE DAY OF SURGERY    Preparing for your Surgery:     Please wear clean loose-fitting clothes and leave valuables at home   Do not shave or remove body hair. Facial shaving is permitted. If you are having head surgery, ask your doctor whether you can shave.  Bring a picture ID and your insurance card, and be prepared to pay your deductible or co-insurance by cash, check, or credit card when you arrive.   If you are going home after your surgery, please make sure to arrange for an adult to drive you home. You CANNOT use UBER or LYFT. If you do not have a ride, your surgery may be cancelled.    Visitor policy during the NIDPO-24 pandemic is subject to change. Current visitor policy can be found at [email or web address]     On The Day of Your Surgery:      Check in at the location mentioned above   If you are a woman of child bearing age, please note that you may be asked  to give a urine sample upon check-in  You will meet your anesthesia and surgery teams in the preoperative holding area before surgery.   Once surgery is over, you will wake up in the recovery room.  If you go home, an adult chaperone will need to stay with you for the first 24 hours after surgery.   Visitor policy during the YIFOY-77 pandemic is subject to change. Current visitor policy can be found at [email or web address]    A video about what to expect for the day of surgery can be found here:    [email or web address]  Or by searching You-tube for [place] before surgery and [place] after surgery     Your medical records are available to you at [email or web address]

## 2021-05-04 ENCOUNTER — Other Ambulatory Visit (HOSPITAL_BASED_OUTPATIENT_CLINIC_OR_DEPARTMENT_OTHER): Payer: Self-pay | Admitting: Urology

## 2021-05-04 DIAGNOSIS — R52 Pain, unspecified: Secondary | ICD-10-CM

## 2021-05-04 NOTE — Anesthesia Preprocedure Evaluation (Addendum)
ANESTHESIA PRE-OPERATIVE EVALUATION    Patient Information    Name: Stephen [name]    MRN: [id number]    DOB: 22-Jan-1951    Age: 70 year old    Sex: male  Procedure(s):  CT WITH ANESTHESIA UROGRAPHY      Pre-op Vitals:   There were no vitals taken for this visit.        Primary language spoken:  English    ROS/Medical History:      History of Present Illness: Stephen [name] is a 70 year old male with congenital hydronephrosis at 70 yo, TURP and recurrent BNC previously managed with [facility], then SPT, now s/p DVIU with [facility] 12/07/2017. Hx of extensive lumbar-sacral spinal fusion.    Pt now s/f Diagnostic ureteroscopy with laser, ureteral stent placement, gemcitabine instillation, possible biopsy, possible TRANSURETHRAL RESECTION OF BLADDER TUMOR    General:  positive for Obesity,  able to climb flight of stairs/Exercise tolaerance >4 mets,   Cardiovascular:  no CAD/Angina/MI/CABG/Stents, Anti-platelet drugs: No,   no LV failure/CHF,   hypertension,  Walks to house and yard, stairs 2/2 back pain he is limited. Denies SOB or C/P.    Baseline HR 40-50's on monitor.   Anesthesia History:  negative anesthesia history ROS  no history of anesthetic complications,  no PONV,  chronic pain patient,  Previous Grade III needed bougie Pulmonary:   negative pulmonary ROS  no asthma,  no COPD,  no sleep apnea,     Neuro/Psych:   negative for TIA/CVA,  no seizures,  Multiple spinal surgeries Hematology/Oncology:   anemia (on past labs),      GI/Hepatic:  GERD, well controlled,  no liver disease,   Infectious Disease:     Renal:  H/o urethral stricture and TURP in the past. Hx of prior nephrectomy 2/2 congenital hydronephrosis.     BLE edematous and erythemic.  Endocrine/Other:  no diabetes,  arthritis,   back pain,  Hx of lumbar-sacral spinal fusion; multiple back surgeries.   Pregnancy History:   Pediatrics:         Pre Anesthesia Testing (PCC/CPC) notes/comments:                 Physical Exam    Airway:      Comment: Pt previously  intubated with a glidescope for spinal surgery; G1 view per notes. Inter-inciser distance 3-4 cm    Mallampati: III  Neck ROM: limited  TM distance: 5-6 cm  Short thick neck: No          Cardiovascular:  - cardiovascular exam normal   Rhythm: regular   Rate: normal         Pulmonary:  - pulmonary exam normal           Neuro/Neck/Skeletal/Skin:    Comment: psoriasis  Positive for Skin abnormalities and Skeletal abnormalities       Dental:    Comment: Multiple missing; poor dentition      Abdominal:      General: obesity     Additional Clinical Notes:               Last  OSA (STOP BANG) Score:  No data recorded    Last OSA  (STOP) Score for   Has a physician diagnosed you with sleep apnea?: No  Do you use a CPAP at home?: No                   Past Medical History:   Diagnosis  Date    Chronic suprapubic catheter (CMS-HCC)     Congenital hydronephrosis     Gout     Headache     Hematuria     Kidney disease     Kidney stones     Major depressive disorder, single episode     Polyarthropathy or polyarthritis of multiple sites     Retinal detachment     Urethral stricture      Past Surgical History:   Procedure Laterality Date    CT INSERTION OF SUPRAPUBIC CATH  09/25/2015    NEPHRECTOMY Right 1995    APPENDECTOMY      COLONOSCOPY      CYSTOSCOPY      CYSTOSCOPY W/ LASER LITHOTRIPSY      OTHER SURGICAL HISTORY      Interstim 01/29/2011    TRANSURETHRAL RESECTION OF PROSTATE       Social History     Socioeconomic History    Marital status: Single   Tobacco Use    Smoking status: Never Smoker    Smokeless tobacco: Never Used   Substance and Sexual Activity    Alcohol use: No    Drug use: Not Currently    Sexual activity: Not Currently     Partners: Female   Other Topics Concern    Military Service No    Blood Transfusions Yes    Caffeine Concern No    Occupational Exposure No    Hobby Hazards No    Sleep Concern Yes     Comment: due to my lower back discomfort and leg discomfort    Stress  Concern No    Weight Concern No    Special Diet No    Back Care Yes     Comment: Am very careful with any activity    Exercises Regularly Yes    Seat Belt Use Yes    Performs Self-Exams Yes     Alcohol Use: Not on file       Current Outpatient Medications   Medication Sig Dispense Refill    acetaminophen (TYLENOL) 325 MG tablet Take 3 tablets (975 mg) by mouth every 8 hours. 130 tablet 0    ALAWAY 0.025 % ophthalmic solution INSTILL 1 DROP INTO BOTH EYES TWICE A DAY AS DIRECTED      allopurinol (ZYLOPRIM) 300 MG tablet Take 300 mg by mouth daily.      amLODIPINE (NORVASC) 5 MG tablet Take 10 mg by mouth daily.      Cetirizine HCl (ZYRTEC PO) 10 mg.       ciclopirox (LOPROX) 0.77 % GEL Apply topically 2 times daily.      docusate sodium (COLACE) 100 MG capsule Take 2 capsules (200 mg) by mouth at bedtime. 60 capsule 0    gabapentin (NEURONTIN) 100 MG capsule Take 1 capsule (100 mg) by mouth 3 times daily for 7 days. Start taking after your procedure is completed. 21 capsule 0    omeprazole (PRILOSEC) 20 MG capsule Take 20 mg by mouth daily.      phenazopyridine (PYRIDIUM) 100 MG tablet Take 1 tablet (100 mg) by mouth 3 times daily for 7 days. Start taking after your procedure is completed. 21 tablet 0    Polyethyl Glycol-Propyl Glycol (SYSTANE ULTRA OP)       pregabalin (LYRICA) 100 MG capsule TAKE 1 CAPSULE BY MOUTH DAILY AT BEDTIME 30 capsule 5    pregabalin (LYRICA) 75 MG capsule TAKE 1 CAPSULE BY MOUTH EVERY MORNING 30 capsule  5    senna (SENOKOT) 8.6 MG tablet Take 2 tablets (17.2 mg) by mouth every morning. 15 tablet 0    triamcinolone (KENALOG) 0.1 % cream Apply 1 Application topically 2 times daily. Apply a thin layer as directed      VITAMIN D PO Take 500 mg by mouth daily.       zolpidem (AMBIEN) 5 MG tablet Take 1 tablet (5 mg) by mouth nightly as needed for Insomnia. 30 tablet 0     No current facility-administered medications for this visit.     Allergies   Allergen Reactions     Amoxicillin Rash    Sulfa Drugs Unspecified       Labs and Other Data  Lab Results   Component Value Date    NA 139 04/28/2021    K 4.3 04/28/2021    CL 102 04/28/2021    BICARB 24 04/28/2021    BUN 21 04/28/2021    CREAT 1.25 (H) 04/28/2021    GLU 96 04/28/2021    CA 10.0 04/28/2021     Lab Results   Component Value Date    AST 27 08/18/2020    ALT 9 08/18/2020    ALK 64 08/18/2020    TP 6.8 08/18/2020    ALB 4.7 08/18/2020    TBILI 1.00 08/18/2020     Lab Results   Component Value Date    WBC 15.0 (H) 08/21/2020    RBC 3.73 (L) 08/21/2020    HGB 11.2 (L) 08/21/2020    HCT 33.4 (L) 08/21/2020    MCV 89.5 08/21/2020    MCHC 33.5 08/21/2020    RDW 14.9 (H) 08/21/2020    PLT 166 08/21/2020    MPV 11.2 08/21/2020    SEG 89 08/18/2020    LYMPHS 6 08/18/2020    MONOS 5 08/18/2020    EOS 0 08/18/2020    BASOS 0 08/18/2020     Lab Results   Component Value Date    INR 1.2 08/18/2020    PTT 28 08/18/2020     Lab Results   Component Value Date    ARTPH 7.42 08/18/2020    ARTPO2 218 (H) 08/18/2020    ARTPCO2 35 (L) 08/18/2020       Anesthesia Plan:  Risks and Benefits of Anesthesia  I have personally performed an appropriate pre-anesthesia physical exam of the patient (including heart, lungs, and airway) prior to the anesthetic and reviewed the pertinent medical history, drug and allergy history, laboratory and imaging studies and consultations.   I have determined that the patient has had adequate assessment and testing.  I have validated the documentation of these elements of the patient exam and/or have made necessary changes to reflect my own observations during my pre-anesthesia exam.  Anesthetic techniques, invasive monitors, anesthetic drugs for induction, maintenance and post-operative analgesia, risks and alternatives have been explained to the patient and/or patient's representatives.    I have prescribed the anesthetic plan:         Planned anesthesia method: General         ASA 2 (Mild systemic disease)      Potential anesthesia problems identified and risks including but not limited to the following were discussed with patient and/or patient's representative: Adverse or allergic drug reaction, Ocular injury, Dental injury or sore throat, Injury to brain, heart and other organs, Death and Recall    No Beta Blocker Indicated: Does not meet criteria and Patient not on beta blockers  Planned monitoring method: Routine monitoring    Informed Consent:  Anesthetic plan and risks discussed with Patient.    Plan discussed with OR Nurse and Surgeon.

## 2021-05-04 NOTE — Telephone Encounter (Addendum)
Pt calling in regards to surgery tomorrow. Pt states he has some Hibiclens soap and will like to know if he can use it before surgery. Pt will like a call or a message to MyChart. Please assist    Thank you     [phone]

## 2021-05-04 NOTE — Telephone Encounter (Signed)
Routing to Anne-Marie to assist.

## 2021-05-04 NOTE — Telephone Encounter (Signed)
Left msg for pt - advised ok to use hibiclens soap before surgery since we have patients with incision based surgery use CHG soap prior.    Also, confirm surgery check in time at 6:20 AM at [facility] and NPO after midnight.    Request call back at [phone] if he has any other questions.

## 2021-05-05 ENCOUNTER — Ambulatory Visit (HOSPITAL_BASED_OUTPATIENT_CLINIC_OR_DEPARTMENT_OTHER): Payer: Medicare Other

## 2021-05-05 ENCOUNTER — Other Ambulatory Visit: Payer: Self-pay

## 2021-05-05 ENCOUNTER — Ambulatory Visit (HOSPITAL_BASED_OUTPATIENT_CLINIC_OR_DEPARTMENT_OTHER): Payer: Medicare Other | Admitting: Student in an Organized Health Care Education/Training Program

## 2021-05-05 ENCOUNTER — Encounter (HOSPITAL_COMMUNITY): Admission: RE | Disposition: A | Discharge status: Home Health | Payer: Self-pay | Attending: Urology

## 2021-05-05 ENCOUNTER — Ambulatory Visit
Admission: RE | Admit: 2021-05-05 | Discharge: 2021-05-05 | Disposition: A | Discharge status: Home / Self Care | Payer: Medicare Other | Attending: Urology | Admitting: Urology

## 2021-05-05 ENCOUNTER — Ambulatory Visit (HOSPITAL_COMMUNITY): Payer: Medicare Other | Admitting: Student in an Organized Health Care Education/Training Program

## 2021-05-05 ENCOUNTER — Encounter (HOSPITAL_COMMUNITY): Payer: Self-pay

## 2021-05-05 DIAGNOSIS — R310 Gross hematuria: Secondary | ICD-10-CM | POA: Insufficient documentation

## 2021-05-05 DIAGNOSIS — C642 Malignant neoplasm of left kidney, except renal pelvis: Secondary | ICD-10-CM

## 2021-05-05 DIAGNOSIS — Z79899 Other long term (current) drug therapy: Secondary | ICD-10-CM | POA: Insufficient documentation

## 2021-05-05 DIAGNOSIS — Z9049 Acquired absence of other specified parts of digestive tract: Secondary | ICD-10-CM | POA: Insufficient documentation

## 2021-05-05 DIAGNOSIS — Z882 Allergy status to sulfonamides status: Secondary | ICD-10-CM | POA: Insufficient documentation

## 2021-05-05 DIAGNOSIS — Z905 Acquired absence of kidney: Secondary | ICD-10-CM | POA: Insufficient documentation

## 2021-05-05 DIAGNOSIS — M109 Gout, unspecified: Secondary | ICD-10-CM | POA: Insufficient documentation

## 2021-05-05 DIAGNOSIS — C652 Malignant neoplasm of left renal pelvis: Secondary | ICD-10-CM | POA: Insufficient documentation

## 2021-05-05 DIAGNOSIS — Z881 Allergy status to other antibiotic agents status: Secondary | ICD-10-CM | POA: Insufficient documentation

## 2021-05-05 DIAGNOSIS — N3289 Other specified disorders of bladder: Secondary | ICD-10-CM | POA: Insufficient documentation

## 2021-05-05 DIAGNOSIS — R52 Pain, unspecified: Secondary | ICD-10-CM

## 2021-05-05 DIAGNOSIS — N2889 Other specified disorders of kidney and ureter: Secondary | ICD-10-CM | POA: Insufficient documentation

## 2021-05-05 DIAGNOSIS — Z6833 Body mass index (BMI) 33.0-33.9, adult: Secondary | ICD-10-CM | POA: Insufficient documentation

## 2021-05-05 DIAGNOSIS — E669 Obesity, unspecified: Secondary | ICD-10-CM | POA: Insufficient documentation

## 2021-05-05 LAB — COVID-19 BINAXNOW ANTIGEN (POCT): COVID-19 Antigen (POCT): NEGATIVE

## 2021-05-05 SURGERY — URETEROSCOPY, NON-STONE
Anesthesia: General | Site: Ureter | Wound class: Class II (Clean Contaminated)

## 2021-05-05 MED ORDER — BELLADONNA ALKALOIDS-OPIUM 16.2-60 MG RE SUPP
1.0000 | Freq: Once | RECTAL | Status: AC
Start: 2021-05-05 — End: 2021-05-05
  Administered 2021-05-05: 60 mg via RECTAL

## 2021-05-05 MED ORDER — ACETAMINOPHEN 325 MG PO TABS
975.0000 mg | ORAL_TABLET | Freq: Once | ORAL | Status: AC
Start: 2021-05-05 — End: 2021-05-05
  Administered 2021-05-05: 975 mg via ORAL
  Filled 2021-05-05: qty 3

## 2021-05-05 MED ORDER — FAMOTIDINE (PF) 20 MG/2ML IV SOLN
INTRAVENOUS | Status: DC | PRN
Start: 2021-05-05 — End: 2021-05-05
  Administered 2021-05-05: 20 mg via INTRAVENOUS

## 2021-05-05 MED ORDER — FENTANYL CITRATE (PF) 100 MCG/2ML IJ SOLN
INTRAMUSCULAR | Status: AC
Start: 2021-05-05 — End: ?
  Filled 2021-05-05: qty 2

## 2021-05-05 MED ORDER — LIDOCAINE HCL 2% EX GEL (UROJET)
Status: AC
Start: 2021-05-05 — End: ?
  Filled 2021-05-05: qty 10

## 2021-05-05 MED ORDER — OXYCODONE HCL 5 MG OR TABS
5.0000 mg | ORAL_TABLET | Freq: Once | ORAL | Status: DC | PRN
Start: 2021-05-05 — End: 2021-05-05

## 2021-05-05 MED ORDER — STERILE WATER FOR INJECTION IJ SOLN
1000.0000 mg | Freq: Once | INTRAVENOUS | Status: AC
Start: 2021-05-05 — End: 2021-05-05
  Administered 2021-05-05: 1000 mg via INTRAVESICAL
  Filled 2021-05-05: qty 1000

## 2021-05-05 MED ORDER — SUGAMMADEX SODIUM 200 MG/2ML IV SOLN
INTRAVENOUS | Status: AC
Start: 2021-05-05 — End: ?
  Filled 2021-05-05: qty 2

## 2021-05-05 MED ORDER — LACTATED RINGERS IV SOLN
INTRAVENOUS | Status: DC | PRN
Start: 2021-05-05 — End: 2021-05-05

## 2021-05-05 MED ORDER — GLYCOPYRROLATE 1 MG/5ML IJ SOLN
INTRAMUSCULAR | Status: DC | PRN
Start: 2021-05-05 — End: 2021-05-05
  Administered 2021-05-05: .1 mg via INTRAVENOUS

## 2021-05-05 MED ORDER — PROPOFOL 200 MG/20ML IV EMUL
INTRAVENOUS | Status: DC | PRN
Start: 2021-05-05 — End: 2021-05-05
  Administered 2021-05-05: 50 mg via INTRAVENOUS
  Administered 2021-05-05: 150 mg via INTRAVENOUS

## 2021-05-05 MED ORDER — LACTATED RINGERS IV SOLN
INTRAVENOUS | Status: DC
Start: 2021-05-05 — End: 2021-05-05
  Administered 2021-05-05: 1 mL via INTRAVENOUS

## 2021-05-05 MED ORDER — FENTANYL CITRATE (PF) 50 MCG/ML IJ SOLN (WRAPPED RECORD) ~~LOC~~
25.0000 ug | INTRAMUSCULAR | Status: DC | PRN
Start: 2021-05-05 — End: 2021-05-05

## 2021-05-05 MED ORDER — BELLADONNA ALKALOIDS-OPIUM 16.2-60 MG RE SUPP
RECTAL | Status: AC
Start: 2021-05-05 — End: ?
  Filled 2021-05-05: qty 1

## 2021-05-05 MED ORDER — LIDOCAINE HCL 2 % IJ SOLN WRAPPED RECORD
INTRAMUSCULAR | Status: DC | PRN
Start: 2021-05-05 — End: 2021-05-05
  Administered 2021-05-05 (×2): 100 mg via INTRAVENOUS

## 2021-05-05 MED ORDER — FENTANYL CITRATE (PF) 250 MCG/5ML IJ SOLN
INTRAMUSCULAR | Status: DC | PRN
Start: 2021-05-05 — End: 2021-05-05
  Administered 2021-05-05 (×3): 50 ug via INTRAVENOUS

## 2021-05-05 MED ORDER — ONDANSETRON HCL 4 MG/2ML IV SOLN
INTRAMUSCULAR | Status: DC | PRN
Start: 2021-05-05 — End: 2021-05-05
  Administered 2021-05-05 (×2): 4 mg via INTRAVENOUS

## 2021-05-05 MED ORDER — FENTANYL CITRATE (PF) 50 MCG/ML IJ SOLN (WRAPPED RECORD) ~~LOC~~
50.0000 ug | INTRAMUSCULAR | Status: DC | PRN
Start: 2021-05-05 — End: 2021-05-05

## 2021-05-05 MED ORDER — OXYBUTYNIN CHLORIDE 5 MG OR TABS
5.0000 mg | ORAL_TABLET | Freq: Three times a day (TID) | ORAL | 0 refills | Status: DC | PRN
Start: 2021-05-05 — End: 2021-06-02
  Filled 2021-05-05: qty 30, 10d supply, fill #0

## 2021-05-05 MED ORDER — DEXAMETHASONE SODIUM PHOSPHATE 4 MG/ML IJ SOLN (CUSTOM)
INTRAMUSCULAR | Status: DC | PRN
Start: 2021-05-05 — End: 2021-05-05
  Administered 2021-05-05 (×2): 4 mg via INTRAVENOUS

## 2021-05-05 MED ORDER — GABAPENTIN 100 MG OR CAPS
100.0000 mg | ORAL_CAPSULE | Freq: Once | ORAL | Status: AC
Start: 2021-05-05 — End: 2021-05-05
  Administered 2021-05-05: 100 mg via ORAL
  Filled 2021-05-05: qty 1

## 2021-05-05 MED ORDER — ROCURONIUM BROMIDE 100 MG/10ML IV SOLN
INTRAVENOUS | Status: DC | PRN
Start: 2021-05-05 — End: 2021-05-05
  Administered 2021-05-05: 50 mg via INTRAVENOUS

## 2021-05-05 MED ORDER — HYDROMORPHONE HCL 1 MG/ML IJ SOLN
0.5000 mg | INTRAMUSCULAR | Status: DC | PRN
Start: 2021-05-05 — End: 2021-05-05

## 2021-05-05 MED ORDER — MIDAZOLAM HCL 2 MG/2ML IJ SOLN
INTRAMUSCULAR | Status: AC
Start: 2021-05-05 — End: ?
  Filled 2021-05-05: qty 2

## 2021-05-05 MED ORDER — CEFAZOLIN SODIUM 1 GM IJ SOLR
INTRAMUSCULAR | Status: DC | PRN
Start: 2021-05-05 — End: 2021-05-05
  Administered 2021-05-05 (×2): 2000 mg via INTRAVENOUS

## 2021-05-05 MED ORDER — NALOXONE HCL 0.4 MG/ML IJ SOLN
0.1000 mg | INTRAMUSCULAR | Status: DC | PRN
Start: 2021-05-05 — End: 2021-05-05

## 2021-05-05 MED ORDER — LIDOCAINE HCL 2% EX GEL (UROJET)
Status: DC | PRN
Start: 2021-05-05 — End: 2021-05-05
  Administered 2021-05-05: 10 mL via URETHRAL

## 2021-05-05 MED ORDER — IOTHALAMATE MEGLUMINE 60 % IJ SOLN
INTRAMUSCULAR | Status: AC
Start: 2021-05-05 — End: ?
  Filled 2021-05-05: qty 50

## 2021-05-05 MED ORDER — ONDANSETRON HCL 4 MG/2ML IV SOLN
4.0000 mg | Freq: Once | INTRAMUSCULAR | Status: DC | PRN
Start: 2021-05-05 — End: 2021-05-05

## 2021-05-05 MED ORDER — SUGAMMADEX SODIUM 200 MG/2ML IV SOLN
INTRAVENOUS | Status: DC | PRN
Start: 2021-05-05 — End: 2021-05-05
  Administered 2021-05-05 (×2): 200 mg via INTRAVENOUS

## 2021-05-05 MED ORDER — MIDAZOLAM HCL 2 MG/2ML IJ SOLN
INTRAMUSCULAR | Status: DC | PRN
Start: 2021-05-05 — End: 2021-05-05
  Administered 2021-05-05: 2 mg via INTRAVENOUS

## 2021-05-05 SURGICAL SUPPLY — 40 items
BAG CYSTO DRAIN SKYTRON TABLE (Misc Surgical Supply) ×3
BAG URINE DRAIN 200Ml (Lines/Drains)
BASKET ZERO TIP NITINOL 1.9FR Ø12MM X 120CM (Procedural wires/sheaths/catheters/balloons/dilators) ×3 IMPLANT
CATHETER FOLEY BARDEX I.C. 18FR 30CC, 3-WAY (Lines/Drains) IMPLANT
CATHETER PLUG WITH DRAINAGE TUBE COVER (Misc Medical Supply) IMPLANT
CATHETER URETERAL OPEN END 5FR X 70CM (Procedural wires/sheaths/catheters/balloons/dilators) ×3 IMPLANT
CONTAINER CHEMOGUARD 7 GALLON - 9300 CAMPUS PT DRIVE ONLY (Misc Medical Supply)
CONTAINER PRECISION SPECIMEN, 4OZ- STERILE (Misc Medical Supply) ×3 IMPLANT
DRAIN BAG URINE 2000 ML (Lines/Drains) IMPLANT
DRAPE X-RAY C ARM 41" X 74" (Drapes/towels) ×3 IMPLANT
DRESSING TELFA STRL 3" X 8" (Dressings/packing) IMPLANT
ELECTRODE CUTTING BIPOLAR 22FR 12/30 DEGREE (Cautery) IMPLANT
ELECTRODE CUTTING BIPOLAR 24FR 12/30 DEGREE (Cautery) IMPLANT
ELECTRODE CUTTING LOOP ROUND MONOPOLAR 22FR 12/30 DEGREE (Cautery) IMPLANT
ELECTRODE MONOPOLAR COAGULATOR BALL SHAPE 22-24FR 12/30 DEGREE (Cautery)
EVACUATOR BLADDER UROVAC (Misc Medical Supply) IMPLANT
FEE FORTEC HOLMIUM LASER HIGH WATT W/ NON FORTEC LASER (Services) ×3
FIBER LASER HOLMIUM 200 (Misc Medical Supply) ×3 IMPLANT
GLOVE SURGEON BIOGEL SIZE 7 (Gloves/Gowns) ×9 IMPLANT
GLOVE SURGEON BIOGEL SIZE 7.5 (Gloves/Gowns) ×3 IMPLANT
GLOVE SURGICAL BIOGEL SIZE 6.5 (Gloves/Gowns) ×3 IMPLANT
GOWN SURGICAL ULTRA XL BLUE, AAMI LVL 3 (Gloves/Gowns) ×3 IMPLANT
GUIDEWIRE SENSOR DUAL FLEX STRAIGHT TIP .035 X 150CM NITINOL (Procedural wires/sheaths/catheters/balloons/dilators) ×6
HOLDER FOLEY CATH SECUREMENT DEVICE STATLOCK (Patient Care Supply) IMPLANT
JMC ONLY- O.R. CAMERA COVER LIGHT, STERILE (Misc Surgical Supply)
KIT SPILL CHEMO USP 800 (Kits/Sets/Trays) IMPLANT
PAD GROUND VALLEYLAB REM ADULT E7507 (Cautery) ×3 IMPLANT
POSITIONER HEAD FOAM 9" DONUT (Misc Medical Supply) ×3 IMPLANT
SCRUB, SURGICAL, EXIDINE, 4%, CHG, 4OZ (Prep Solutions) ×3
SHEATH URETERAL NAVIGATOR HD 12/14FR X 46CM (Procedural wires/sheaths/catheters/balloons/dilators) ×3 IMPLANT
SLEEVE SCD KNEE MEDIUM (Patient Care Supply) ×3 IMPLANT
SOLUTION IRR BAG .9% N/S 3000ML (Non-Pharmacy Meds/Solutions) ×6
SOLUTION IRR BAG H20 3000ML (Non-Pharmacy Meds/Solutions) IMPLANT
SOLUTION IRR POUR BTL 0.9% NS 1000ML (Non-Pharmacy Meds/Solutions) ×3 IMPLANT
SOLUTION IRR POUR BTL H20 1000ML (Non-Pharmacy Meds/Solutions) ×3
SPOON METAL STERILE (Disp Instruments) IMPLANT
STENT ASCERTA FIRM 6FR X 26CM (Ureteral Stents) ×3 IMPLANT
SURGICAL PACK CYSTO - SAME DAY (Procedure Packs/kits) ×3
TUBING SUCTION MEDI-VAC 9/32" X 20' (Tubing/Suction) ×3 IMPLANT
VALVE ENDOSCOPIC ADJUSTABLE UROSEAL (Misc Surgical Supply) ×3 IMPLANT

## 2021-05-05 NOTE — H&P (Signed)
HISTORY & PHYSICAL - INTERVAL ASSESSMENT    **ONLY TO BE USED IN ADDITION TO A HISTORY & PHYSICAL**    Stephen [name]  [id number]      This interval H&P update references the history and physical documentation from this date:  05/03/2021    Current Medical Status:  Unchanged    Medications / Allergies:  Unchanged    Review of Systems:  Unchanged    Physical Examination:  BP 115/76 (BP Location: Right arm, BP Patient Position: Semi-Fowlers)   Pulse 56   Temp 97.5 F (36.4 C)   Resp 18   Ht '5\' 6"'  (1.676 m)   Wt 93.7 kg (206 lb 8 oz)   SpO2 98%   BMI 33.33 kg/m   I have examined the patient today.  Unchanged    Laboratory or Clinical Data:  Lab Results   Component Value Date    CO19 Not Detected 08/15/2020     Lab Results   Component Value Date    NA 139 04/28/2021    K 4.3 04/28/2021    CL 102 04/28/2021    BICARB 24 04/28/2021    BUN 21 04/28/2021    CREAT 1.25 (H) 04/28/2021    GLU 96 04/28/2021    [place] 10.0 04/28/2021     Lab Results   Component Value Date    WBC 15.0 (H) 08/21/2020    RBC 3.73 (L) 08/21/2020    HGB 11.2 (L) 08/21/2020    HCT 33.4 (L) 08/21/2020    MCV 89.5 08/21/2020    MCHC 33.5 08/21/2020    RDW 14.9 (H) 08/21/2020    PLT 166 08/21/2020    MPV 11.2 08/21/2020    SEG 89 08/18/2020    LYMPHS 6 08/18/2020    MONOS 5 08/18/2020    EOS 0 08/18/2020    BASOS 0 08/18/2020     Lab Results   Component Value Date    AST 27 08/18/2020    ALT 9 08/18/2020    ALK 64 08/18/2020    TP 6.8 08/18/2020    ALB 4.7 08/18/2020    TBILI 1.00 08/18/2020     No results found for: INR, PTT      Modifications of Initial Care Plan:  The care plan has been discussed with the attending physician of record and remains unchanged. This was discussed with the patient today on the morning of the procedure. The patient understands the current plan and verbally consents to the proposed treatment.     Polo Riley Javier-Desloges     05/05/21     8:03 AM

## 2021-05-05 NOTE — Discharge Instructions (Signed)
You have a stent placed in your left ureter. This may cause an increase in the number of times you urinate, blood in urine, and burning on urination. Take prescribed medication or Tylenol for your discomfort.    Medications: OK to restart medications as indicated on your discharge list. You have been provided narcotics pain medication for pain. If tylenol is enough, take that. Pain medication can cause constipation. Take stool softener (Docusate) while taking pain medication. Do not operate heavy machinery or drive while taking pain medication.    You may also be provided with Tamsulosin. Please take as prescribed for stent pain/discomfort.   Take oxybutynin for bladder spasms (side effects include dry eyes, dry mouth, constipation)    Please seek immediate medical attention or come to the emergency department if you experience fevers >101.4, pain not controlled by pain meds and intractable nausea and vomiting, chest pain or shortness of breath, inability to urinate.    Expect to see blood tinged urine after this procedure. As long as you are able to urinate, this should resolve on its own.    You will follow up with [name] in 2 weeks. Someone will call you to schedule this appointment.     Urology Contact Information:     If you have any questions about your hospital care, your medications, or if you have new or concerning symptoms soon after going home, and you need to contact your hospital physician, your hospital physician can be contacted in the following manner:    Business Hours (Monday - Friday; 8:00 AM - 4:30 PM): [phone]    After-Hours, Weekends/Holidays:  FOR EMERGENCY ISSUES ONLY, call [phone] and ask them to page the on-call Urologist. If it is not an emergency, then wait for business hours and call one of the numbers above.    If your clinic appointment has not been scheduled before you leave the hospital and you do not receive a call within 2 business days, call the clinic scheduler for your  urologist's clinic to schedule the appointment.

## 2021-05-05 NOTE — Brief Op Note (Signed)
BRIEF OPERATIVE NOTE    DATE: 05/05/2021  TIME: 10:37 AM    PREOPERATIVE DIAGNOSIS: upper tract mass, solitary left kidney    POSTOPERATIVE DIAGNOSIS: same    PROCEDURE INFORMATION:  Procedure(s):  Diagnostic ureteroscopy with laser, ureteral stent placement, gemcitabine instillation, biopsy, - Wound Class: Class II (Clean Contaminated) - Incision Closure: No Incision / NA    ATTENDING SURGEON:   Surgeon(s) and Role:     * Carlota Raspberry, MD - Primary     * Macie Burows, MD - Resident - Assisting     * Floydene Flock, MD - Resident - Assisting    ANESTHESIA: general    FINDINGS: 1) large papillary mass in renal pelvis, about 3cm, with two small satellite lesions.   2) all lesions fulgurated or removed endoscopically  3) cytology sent  4) 21fr x 26cm stent placed    Implant Name Type Inv. Item Serial No. Manufacturer Lot No. LRB No. Used Action   STENT ASCERTA FIRM 6FR X [id number] - [id number] Ureteral Stents STENT ASCERTA FIRM 6FR X 32NV  [employer]  Left 1 Implanted         SPECIMENS:   ID Type Source Tests Collected by Time Destination   1 : Left kidney cytology Body Fluid Kidney, Left CYTOPATH NON GYN Carlota Raspberry, MD 05/05/2021 1019    A : LEFT RENAL PELVIC TUMOR Tissue Kidney, Left PATHOLOGY TISSUE Veronia Beets, MD 05/05/2021 0954          Fluids/Blood Products:      IV Fluids: 500cc    Blood Products: none    EBL: 41ml    Urine Output: unknown    COMPLICATIONS: none    DISPOSITION: PACU  Post op gemcitabine  Follow up two weeks for path

## 2021-05-05 NOTE — Discharge Summary (Shared)
[facility]   Discharge Summary      Date Today:  05/05/21     Patient Name:  Stephen [name]  MRN:    [id number]  PCP:   [name]  Attending:   Carlota Raspberry, MD  Service:   Urology    Principal Diagnosis (required):  Left renal mass [facility] Problem List (required):  Active Hospital Problems    Diagnosis    *Left renal mass [N28.89]      Resolved Hospital Problems   No resolved problems to display.     Past Medical History:   Diagnosis Date    Chronic suprapubic catheter (CMS-HCC)     Congenital hydronephrosis     Gout     Headache     Hematuria     Kidney disease     Kidney stones     Major depressive disorder, single episode     Polyarthropathy or polyarthritis of multiple sites     Retinal detachment     Urethral stricture      Past Surgical History:   Procedure Laterality Date    CT INSERTION OF SUPRAPUBIC CATH  09/25/2015    NEPHRECTOMY Right 1995    APPENDECTOMY      COLONOSCOPY      CYSTOSCOPY      CYSTOSCOPY W/ LASER LITHOTRIPSY      OTHER SURGICAL HISTORY      Interstim 01/29/2011    TRANSURETHRAL RESECTION OF PROSTATE         Additional Hospital Diagnoses ("rule out" or "suspected" diagnoses, etc.):  ***None    Principal Procedure During This Hospitalization (required):  Procedure(s):  Diagnostic ureteroscopy with laser, ureteral stent placement, gemcitabine instillation, possible biopsy,  possible TRANSURETHRAL RESECTION OF BLADDER TUMOR on 05/05/2021    Other Procedures Performed During This Hospitalization (required):  CT Urography    Result Date: 04/22/2021  Narrative: EXAM DESCRIPTION: CT UROGRAPHY CLINICAL HISTORY: Marked left hydronephrosis, solitary kidney, evaluate drainage. TECHNIQUE: COVERAGE: Abdomen and pelvis IV CONTRAST: 100 ml Omnipaque 350 PHASES ACQUIRED: Noncontrast, nephrographic, delayed supine and prone POSITIVE ORAL CONTRAST GIVEN: No ADVERSE EVENTS: None RECONSTRUCTIONS: Axial 2.65mm and sagittal/coronal 29mm Up-to-date CT equipment and  radiation dose reduction techniques were employed. CTDIvol: 20.4 - 22.8 mGy. DLP: 8841 mGy-cm. COMPARISON: None FINDINGS: LUNG BASES: Bilateral basal atelectasis. LIVER:Right hepatic subcentimeter hypoenhancing focus, too small to characterize, possibly small cyst. BILIARY:Unremarkable PANCREAS: Unremarkable SPLEEN:Unremarkable ADRENAL GLANDS: Unremarkable KIDNEYS: Postsurgical changes of a right nephrectomy. Left anterior renal pelvic mural-based enhancing filling defect measuring 22 x 12 mm, with scattered calcifications. No other nephrolithiasis. Moderate left hydronephrosis. Moderate left ureteropelvic junctional urothelial thickening and irregularity. STOMACH/DUODENUM: Unremarkable VASCULATURE: Moderate atherosclerotic calcifications. Replaced right hepatic artery. LYMPHATIC: Unremarkable SMALL & LARGE BOWEL: Normal appendix. Extensive diverticulosis. No diverticulitis. BLADDER/PELVIC ORGANS: Mild right anterior bladder wall thickening and irregularity. Postprocedural changes of TURP. BONES/SOFT TISSUES: Multilevel spine degenerative changes. Bilateral inguinal hernias containing fat. Postsurgical changes of posterior lumbar laminectomy and fusion. OTHER: None CONCURRENT SUPERVISION: I have reviewed the images and agree with the resident interpretation. DOSE STATEMENT: "[place] CT scanners employ modern techniques for CT dose reduction, including protocol review, automatic exposure control, and iterative reconstruction techniques. 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 Moderate left ureteropelvic junctional and mild right anterior bladder wall urothelial thickening and irregularity, which are  indeterminate, but may also represent additional sites of urothelial disease. Moderate left hydronephrosis. Status post right nephrectomy. No evidence of metastatic disease in the abdomen or pelvis outside the urothelial tract.       Consultations Obtained During This Hospitalization:  {Consulting Services:12736}    Key consultant recommendations:  ***    Reason for Admission to the Hospital / History of Present Illness:  Stephen [name] is a 70 year old male who has a past medical history of Chronic suprapubic catheter (CMS-HCC), Congenital hydronephrosis, Gout, Headache, Hematuria, Kidney disease, Kidney stones, Major depressive disorder, single episode, Polyarthropathy or polyarthritis of multiple sites, Retinal detachment, and Urethral stricture.    He has no past medical history of AF (atrial fibrillation) (CMS-HCC), Asthma, Chronic kidney disease (CKD), Chronic obstructive airway disease (CMS-HCC), Congestive heart failure (CMS-HCC), Heart disease, Hypertension, or Hypothyroidism. He was admitted with Left renal mass [N28.89]. He underwent Diagnostic ureteroscopy with laser, ureteral stent placement, gemcitabine instillation, possible biopsy,, possible TRANSURETHRAL RESECTION OF BLADDER TUMOR for Left renal mass [N28.89] by Dr. Carlota Raspberry, [place] by Problem (required):  Stephen [name] underwent Diagnostic ureteroscopy with laser, ureteral stent placement, gemcitabine instillation, possible biopsy,, possible TRANSURETHRAL RESECTION OF BLADDER TUMOR for Left renal mass [N28.89] by Dr. Carlota Raspberry, MD. The patient tolerated the procedure well. Post-operatively, patient was admitted to the hospital for recovery. The patient had an uneventful post-operative course.     On day of discharge, the patient's pain was well controlled on oral pain medications, voided without assistance, ambulated without difficulty, and tolerated a Diet NPO; Yes; Patient NPO for procedure; Stone procedure  diet. The patient was discharged {with/without} a drain. He was discharged on Day of Surgery to ***. Post discharge plan includes follow up with Dr. Carlota Raspberry, MD  *** in 1-2 weeks. Pt expressed understanding of follow up plans.    Tests Outstanding at Discharge Requiring Follow Up:  * No specimens in log *    Discharge Condition (required):  Stable.    Key Physical Exam Findings at Discharge:  Physical examination is significant for:  General: alert and oriented x3, no apparent distress  Respiratory: nonlabored respirations  Cardiovascular: regular rate and rhythm, well-perfused  Abdomen: soft, ***appropriately tender, nondistended  Genitourinary: no CVA tenderness, foley in place draining clear urine    Discharge Diet:    Diet NPO; Yes; Patient NPO for procedure; Stone procedure    Discharge Medications:  {Fire This Link Only When ALL Discharge Medication Documentation Has Been Completed:13193}    Allergies:  Allergies   Allergen Reactions    Amoxicillin Rash    Sulfa Drugs Unspecified       Discharge Disposition:  Home.    Discharge Code Status: Orders Placed This Encounter      Full Code - Call Code    This code status is not changed from the time of admission.    Follow Up Appointments:  Scheduled appointments:  {Fire This Link Only When Discharge Summary is Ready for Final Signature:13194}    For appointments requested for after discharge that have not yet been scheduled, refer to the Post Discharge Referrals section of the After Visit Summary.

## 2021-05-05 NOTE — Anesthesia Postprocedure Evaluation (Signed)
Anesthesia Post Note    Patient: Stephen [name]    Procedure(s) Performed: Procedure(s):  Diagnostic ureteroscopy with laser, ureteral stent placement, gemcitabine instillation, biopsy,      Final anesthesia type: General    Patient location: PACU    Post anesthesia pain: adequate analgesia    Mental status: awake, alert  and oriented    Airway Patent: Yes    Last Vitals:   Vitals Value Taken Time   BP 153/83 05/05/21 1105   Temp 36 C 05/05/21 1050   Pulse 70 05/05/21 1113   Resp 24 05/05/21 1113   SpO2 90 % 05/05/21 1113   Vitals shown include unvalidated device data.     Post vital signs: stable    Hydration: adequate    N/V:no    Anesthetic complications: no    Plan of care per primary team.

## 2021-05-05 NOTE — Plan of Care (Signed)
Problem: Promotion of Perioperative Health and Safety  Goal: Promotion of Health and Safety of the Perioperative Patient  Description: The patient remains safe, receives treatment appropriate to the surgical intervention and patient's physiological needs and is discharged or transferred to the appropriate level of care.    Information below is the current care plan.  Outcome: Progressing  Flowsheets (Taken 05/05/2021 1032)  Patient /Family stated Goal: UTA  Guidelines: PACU  Individualized Interventions/Recommendations #1: anticipate needs  Individualized Interventions/Recommendations #2 (if applicable): provide restful environment

## 2021-05-05 NOTE — Op Note (Signed)
DATE OF SERVICE:  05/05/2021    PREOPERATIVE DIAGNOSIS:  Solitary left kidney, upper tract renal  pelvic mass.     POSTOPERATIVE DIAGNOSIS:  Solitary left kidney, upper tract renal  pelvic mass.     PROCEDURE PERFORMED:  Diagnostic left ureteroscopy with laser  fulguration and removal of tumor, left ureteral stent placement,  intravesical gemcitabine installation.     SURGEON/STAFF:  Carlota Raspberry, MD    ASSISTANTS:  Macie Burows, MD   Vi Alfonse Spruce    ANESTHESIA:  General.    FINDINGS:    1. Large papillary mass in renal pelvis about 3 cm in size with 2  small satellite lesions.   2. All lesions fulgurated or removed endoscopically.  3. Cytology sent.  4. 6-French x 26 cm stent placed under fluoroscopic guidance.    SPECIMENS:  Left kidney cytology and left renal pelvic tumor for  permanent.     IV FLUID:  0.5 L crystalloid.    BLOOD PRODUCTS:  None.    ESTIMATED BLOOD LOSS:  5 cc.    URINE OUTPUT:  None.    COMPLICATIONS:  None.    DISPOSITION:  PACU.    INDICATIONS FOR PROCEDURE:  This is a 70 year old gentleman with  history of a right nephrectomy in the past, who presented and was  found to have a renal pelvis tumor in his solitary kidney on the  left.  He was counseled on all options and elected to proceed with  diagnostic and potentially therapeutic ureteroscopy.     PROCEDURE IN DETAIL:  Patient was met in the preoperative area.  Informed consent was obtained.  He was taken to the operating room in  stable condition.  SCDs were applied prior to the induction of  general anesthesia.  He was given Ancef for perioperative antibiotic  prophylaxis.  He had a negative urine culture preoperatively.  He was  prepped and draped in standard fashion, with a final safety time-out  to confirm the correct patient and procedure.  We began by  lubricating and inserting a 21-French rigid cystoscope into the  bladder.  Of note, he had a somewhat pendulous urethra with a  somewhat fixed bladder neck which was  evident from his prior  endoscopy in the past.  We performed a complete cystoscopic survey  and noted there were no abnormal lesions in the bladder.  We then  cannulated his left ureteral orifice which was actually somewhat  challenging given the somewhat distorted bladder trigone architecture  and placed a wire into the kidney under fluoroscopic guidance.  We  then passed a semi-rigid ureteroscope into the kidney and immediately  located a large 3 cm renal pelvis tumor.  Using a 200 nanometer  holmium laser fiber, we attempted to disassociate this tumor from its  urothelial base.  We performed approximately 70% of this and then  decided that the other aspect which was more lateral would be best  handled with the flexible ureteroscope given the somewhat challenging  angle with the semi-rigid scope.  On our way out, we noted there were  2 small satellite papillary lesions which were completely fulgurated  with the fiber.  We then placed a 2nd safety wire through an access  sheath and placed a 12 14 x 46 cm access sheath into the kidney under  fluoroscopic guidance. Using a flexible ureteroscope we were able to  completely disassociate the remainder of the renal pelvis tumor and  basket retrieved the specimen completely in multiple  passes.  We  performed a complete nephroscopy at the conclusion and did not locate  any other concerning areas.  We sent a post treatment cytology for  analysis as well.  We then concluded by placing a 6-French x 26 cm  double-J curled stent with fluoroscopic guidance and visually saw a  distal curl in the bladder.  We then placed a 20-French 2 way  catheter and then used intravesical gemcitabine for an hour  instillation.  The attending physician, Dr. Carlota Raspberry, was  present and scrubbed for all key portions of the procedure.       Job #:  [id number]

## 2021-05-08 ENCOUNTER — Encounter (HOSPITAL_BASED_OUTPATIENT_CLINIC_OR_DEPARTMENT_OTHER): Payer: Self-pay | Admitting: Nurse Practitioner

## 2021-05-10 ENCOUNTER — Other Ambulatory Visit (HOSPITAL_BASED_OUTPATIENT_CLINIC_OR_DEPARTMENT_OTHER): Payer: Self-pay | Admitting: Nurse Practitioner

## 2021-05-10 DIAGNOSIS — Z981 Arthrodesis status: Secondary | ICD-10-CM

## 2021-05-10 MED ORDER — ACETAMINOPHEN 325 MG PO TABS
975.0000 mg | ORAL_TABLET | Freq: Three times a day (TID) | ORAL | 0 refills | Status: DC
Start: 2021-05-10 — End: 2021-08-19

## 2021-05-10 NOTE — Telephone Encounter (Signed)
RN called pt.   LBP started to increase in the last 2-3 weeks, no trauma/precipitating event  Sharp pain   Worse with movement  Denies n/t.   Pt has not has not tried any medications. Takes Lyrica and allopurinol.     Extra strength 1000 mg Q8h and NSAID OTC per bottle dosing instructions.   Pt will try icing as well too.   Advised no BLT's    Will route to provider to review and advise.

## 2021-05-10 NOTE — Telephone Encounter (Signed)
Routed to triage rn as te

## 2021-05-10 NOTE — Telephone Encounter (Signed)
Patient following up on message bellow, per patient would like pain medication before 05/24/21.    Ph: [phone].

## 2021-05-10 NOTE — Telephone Encounter (Signed)
From: Stephen [name]  To: Sheria Lang, NP  Sent: 05/08/2021 8:18 PM PDT  Subject: Pain control     Please order me something for my back pain constant whole lower back mainly base of spine I see you onJune 6 it might be muscle my back is what it is you have done a lot glad I am still mobile /thank you/ [id number]

## 2021-05-10 NOTE — Telephone Encounter (Signed)
Per NP Bilenkaya,  Tylenol was sent to the pharmacy.  MyChart message sent to patient.  (last log in was 05-10-21)

## 2021-05-10 NOTE — Telephone Encounter (Signed)
See my chart message

## 2021-05-10 NOTE — Telephone Encounter (Signed)
MyChart Message on 05/08/21  Please order me something for my back pain constant whole lower back mainly base of spine I see you onJune 6 it might be muscle my back is what it is you have done a lot glad I am still mobile /thank you/ [id number]    DOS 08/18/20 Lumbar fusion L3-S1  LOV 02/02/21- WNL, doing well, f/u in 6 mo    Left pt a voicemail advising to call back to [employer].

## 2021-05-11 ENCOUNTER — Other Ambulatory Visit: Payer: Self-pay

## 2021-05-11 ENCOUNTER — Encounter (HOSPITAL_BASED_OUTPATIENT_CLINIC_OR_DEPARTMENT_OTHER): Payer: Self-pay

## 2021-05-12 ENCOUNTER — Encounter (HOSPITAL_COMMUNITY): Payer: Self-pay | Admitting: Urology

## 2021-05-13 ENCOUNTER — Other Ambulatory Visit: Payer: Self-pay

## 2021-05-17 ENCOUNTER — Other Ambulatory Visit: Payer: Self-pay

## 2021-05-18 ENCOUNTER — Ambulatory Visit: Payer: Medicare Other | Attending: Urology | Admitting: Urology

## 2021-05-18 ENCOUNTER — Encounter (HOSPITAL_BASED_OUTPATIENT_CLINIC_OR_DEPARTMENT_OTHER): Payer: Self-pay | Admitting: Nurse Practitioner

## 2021-05-18 ENCOUNTER — Other Ambulatory Visit (HOSPITAL_BASED_OUTPATIENT_CLINIC_OR_DEPARTMENT_OTHER): Payer: Medicare Other

## 2021-05-18 ENCOUNTER — Encounter (HOSPITAL_BASED_OUTPATIENT_CLINIC_OR_DEPARTMENT_OTHER): Payer: Self-pay | Admitting: Urology

## 2021-05-18 ENCOUNTER — Other Ambulatory Visit (HOSPITAL_BASED_OUTPATIENT_CLINIC_OR_DEPARTMENT_OTHER): Payer: Self-pay | Admitting: Urology

## 2021-05-18 VITALS — BP 128/65 | HR 53 | Temp 98.1°F | Resp 18 | Ht 66.0 in | Wt 203.0 lb

## 2021-05-18 DIAGNOSIS — R310 Gross hematuria: Secondary | ICD-10-CM

## 2021-05-18 DIAGNOSIS — M545 Low back pain, unspecified: Secondary | ICD-10-CM

## 2021-05-18 DIAGNOSIS — N2889 Other specified disorders of kidney and ureter: Secondary | ICD-10-CM | POA: Insufficient documentation

## 2021-05-18 LAB — EMMI , ANESTHESIA (ADULT): EMMI Video Order Number: 14164509797

## 2021-05-18 LAB — EMMI, NOSE TO TOES PRE-OPERATIVE CLEANSING: EMMI Video Order Number: 18828915704

## 2021-05-18 MED ORDER — VITAMIN C 500 MG/5ML OR SYRP
ORAL_SOLUTION | ORAL | Status: DC
Start: ? — End: 2022-01-19

## 2021-05-18 NOTE — Progress Notes (Signed)
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  CT urography on 04/22/2021 confirmed a filling defect in the anterior left renal pelvis.  Chest CT has been ordered but not performed yet.  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  He tolerated the procedure well.  Stent is still in place.      Review of Systems: No TIA's or unusual headaches, no dysphagia.  No prolonged cough. No dyspnea or chest pain on exertion.  No abdominal pain, change in bowel habits, black or bloody stools.  No urinary tract or BPH symptoms.  No new or unusual musculoskeletal symptoms.    Physical Exam:  There were no vitals filed for this visit.   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    -we discussed the pathology and how the most definitive option from an oncologic perspective would be radical nephro ureterectomy with lymphadenectomy bladder cuff excision with or without neoadjuvant chemotherapy.  This would leave the patient anephric and dialysis dependent until transplant could be considered.    Alternatively, endoscopic management could be considered though this is not a standard option for high-grade urothelial carcinoma.  This would entail repeat ureteroscopy  in 4-6 weeks with repeat biopsies and ablation and depending on the status of the upper tracts, postoperative intracavitary chemotherapy could be instilled.    This may be preferable from a quality of life perspective, but again is not the most definitive oncologic option.    I will order tumor sequencing in the event that he has microsatellite instability or FGFR3 mutations that may make him more susceptible to systemic therapy, targeted therapy or immunotherapy.  Finally upfront systemic therapy and endoscopic management would be another consideration.

## 2021-05-18 NOTE — Patient Instructions (Addendum)
PLAN:   -Bloodwork today at [facility]  -Repeat Ureterscopy 4-6 weeks        Carlota Raspberry MD  Department of Urology     [facility] - Urology  Monday-Friday 8:00- 5:00p.m. (Closed Holidays and Weekends)     For any question or to schedule any appointment call [phone]    AFTER HOURS EMERGENCY NUMBER [phone]  Ask for the Urologist/Oncologist Physician On-Call.        [place] - Urology  Fax number: [phone]     [id number] Assistant/Surgery [place]   Phone [phone]  Fax [phone]    [place]   Phone [phone] Option 2  Chemotherapy appointments  [place] Monday - Friday 7:30AM - 9:30PM  Holidays/Weekends 8:00AM -- 6:00PM     Radiation Oncology   Phone [phone]    Radiation appointments     MRI    Phone [phone] or [phone]  MRI appointments     Radiology Scheduling   Phone [phone]   [name], CT Scans, or studies.       Social Worker  Agnes Lawrence, [place]  Phone [phone]      [employer] [phone]     Ostomy nurse  "STAR" Carmie End, Shelly Bombard, BSN, [facility]  Phone [phone]  Fax [phone]  Clinic days: Tuesday/Thursday  Office days: Wednesday/Friday

## 2021-05-20 ENCOUNTER — Telehealth (HOSPITAL_BASED_OUTPATIENT_CLINIC_OR_DEPARTMENT_OTHER): Payer: Self-pay | Admitting: Urology

## 2021-05-20 ENCOUNTER — Ambulatory Visit
Admission: RE | Admit: 2021-05-20 | Discharge: 2021-05-20 | Disposition: A | Discharge status: Home / Self Care | Payer: Medicare Other | Attending: Student in an Organized Health Care Education/Training Program | Admitting: Student in an Organized Health Care Education/Training Program

## 2021-05-20 DIAGNOSIS — R1900 Intra-abdominal and pelvic swelling, mass and lump, unspecified site: Secondary | ICD-10-CM

## 2021-05-20 DIAGNOSIS — R310 Gross hematuria: Secondary | ICD-10-CM | POA: Insufficient documentation

## 2021-05-20 DIAGNOSIS — N2889 Other specified disorders of kidney and ureter: Secondary | ICD-10-CM | POA: Insufficient documentation

## 2021-05-20 LAB — URINE CULTURE: Urine Culture Result: NO GROWTH

## 2021-05-20 MED ORDER — IOHEXOL 350 MG/ML IV SOLN
75.0000 mL | Freq: Once | INTRAVENOUS | Status: AC
Start: 2021-05-20 — End: 2021-05-20
  Administered 2021-05-20: 75 mL via INTRAVENOUS
  Filled 2021-05-20: qty 75

## 2021-05-20 NOTE — Telephone Encounter (Signed)
SURGERY: Diagnostic ureteroscopy with laser, ureteral stent placement, gemcitabine instillation, biopsy     Surgeon: Dr. Sallyanne Kuster    Received surgical order in Epic from Dr. Sallyanne Kuster    Per Nurse Vicente Males, schedule pt in 4-6 weeks from 05/31     I contacted patient at [phone] to discuss surgery scheduling, there's no answer.     I left a detailed message and provided patient with my direct number to return my call to coordinate.    1st attempt to contact pt, sent MyChart msg as well      ===View-only below this line===  ----- Message -----  From: Bethena Roys  Sent: 05/18/2021  10:04 AM PDT  To: Lucianne Muss    Please schedule patient for URS in 4-6 weeks.  Urine collected.

## 2021-05-21 NOTE — Telephone Encounter (Signed)
Spoke with patient - scheduled surgery at [facility] and provided plan below.    Reviewed pre op instructions with patient - NPO after midnight    Reviewed Transportation with family/friend after discharge   - Please arrange for transportation home after your surgery and hospital stay. A taxi, shuttle bus, uber or lyft is NOT acceptable! You will need to be accompanied home by a responsible adult.  - You must have a friend or relative accompany you on the day of surgery.  This person will be asked to review discharge instructions with the nurse and to take you home. If you do not bring someone, your surgery may be cancelled.   - You CANNOT drive yourself home or use public transportation. You CANNOT use a ride-share service (Uber/Lyft) or a taxi unless you have your friend or relative accompany you    **Pt will have medical transportation**    Reviewed [place] visitor policy - provide proof of vaccination or negative COVID test within 72 hours of the visit    Pt verbalized understanding    SURGERY PLAN  DATE - 06/29   CHECK IN - 12:30 PM (needs to be notified 3-5 days if time changes)   APC - 06/15  COVID-19 test - not needed   POV - 07/08  [Patient aware surgery time is subject to change]    QUESTIONS  Sleep Apnea - no  Pacemaker/Cardiac Issues - no  Catheter/Port/NT - no  Blood Thinners -  no  Smoking? - no   [Patient aware to discontinue any blood thinning medications/vitamins 7 days prior to surgery]    Hospitalized w/in 3 months - no  Able to walk to walk up 2 flight of stairs - yes  Difficulty w/ anesthesia or airway - no    Urine Culture - to be completed w/in 30 days - completed on 05/31    Is Medical Clearance needed - not needed    Surgery Letter - to be sent via MyChart      Surgical Consent - media     Auth:  Will request as time gets closer per auth teams request.       *NOTES IF ANY

## 2021-05-22 ENCOUNTER — Other Ambulatory Visit (HOSPITAL_BASED_OUTPATIENT_CLINIC_OR_DEPARTMENT_OTHER): Payer: Self-pay | Admitting: Urology

## 2021-05-22 LAB — EMMI , PATIENT SATISFACTION: HOSPITAL DISCHARGE EXPECTATIONS: EMMI Video Order Number: 17884382494

## 2021-05-22 LAB — EMMI,  PAIN MANAGEMENT: ACUTE IN HOSPITAL: EMMI Video Order Number: 16002623532

## 2021-05-24 ENCOUNTER — Ambulatory Visit: Payer: Medicare Other | Attending: Nurse Practitioner | Admitting: Nurse Practitioner

## 2021-05-24 ENCOUNTER — Encounter (HOSPITAL_BASED_OUTPATIENT_CLINIC_OR_DEPARTMENT_OTHER): Payer: Self-pay | Admitting: Nurse Practitioner

## 2021-05-24 ENCOUNTER — Encounter (HOSPITAL_BASED_OUTPATIENT_CLINIC_OR_DEPARTMENT_OTHER): Payer: Self-pay | Admitting: Hospital

## 2021-05-24 ENCOUNTER — Ambulatory Visit (HOSPITAL_BASED_OUTPATIENT_CLINIC_OR_DEPARTMENT_OTHER): Payer: Medicare Other | Admitting: Nurse Practitioner

## 2021-05-24 DIAGNOSIS — M549 Dorsalgia, unspecified: Secondary | ICD-10-CM

## 2021-05-24 MED ORDER — TIZANIDINE HCL 4 MG OR TABS
4.0000 mg | ORAL_TABLET | Freq: Four times a day (QID) | ORAL | 0 refills | Status: DC | PRN
Start: 2021-05-24 — End: 2021-08-05

## 2021-05-24 NOTE — Progress Notes (Signed)
Division of Spine Surgery    Post Op Progress Note    Date Of Surgery:   05/05/2021    Chief Complaint:   Post Op from ALIF L5-S1    HPI:  Stephen [name] is a 70 year old male who returns for his post operative visit and is doing very well.  He only has some occasional midline lower back discomfort with activity.  He reports no radiating symptoms down his lower extremities.  He has been going to physical therapy once a week.  He no longer wears the back brace.  He is not taking any pain medications.  He denies any fevers or chills.    Interval history: states he has lowe back pain across the waste that is always present. He is ambulating. He has difficulty getting to PT due to transportation issues.     Physical Exam:   differed due to type of visit.  Radiographs:  none     Impression:  Post Op from Above Surgery with persistent pain.     Plan:  Continue physical therapy and HEP.  Will obtain an Xray. Will refer to pain clinic for consideration of injections.  F/u in 6 weeks     ---------------------(data below generated by Sheria Lang, NP)--------------------     Patient Verification & Telemedicine Consent & Financial Waiver:    1.   Identity: I have verified this patient's identity to be accurate.  2.   Consent: I verify consent has been secured in one of the following methods: (a) obtained written/ online attestation consent (via MyChartVideoVisit pathway), (b) the spoke-side provider has obtained verbal or written consent from patient/surrogate (if this is a "provider to provider" evaluation), or (c) in all other cases, I have personally obtained verbal consent from the patient/ surrogate (noting all elements below) to perform this voluntary telemedicine evaluation (including obtaining history, performing examination and reviewing data provided by the patient).   The patient/ surrogate has the right to refuse this evaluation.  I have explained risks (including potential loss of confidentiality),  benefits, alternatives, and the potential need for subsequent face to face care. Patient/ surrogate understands that there is a risk of medical inaccuracies given that our recommendations will be made based on reported data (and we must therefore assume this information is accurate).  Knowing that there is a risk that this information is not reported accurately, and that the telemedicine video, audio, or data feed may be incomplete, the patient agrees to proceed with evaluation and holds [name] harmless knowing these risks.  3.   Healthcare Team: The patient/ surrogate has been notified that other healthcare professionals (including students, residents and Metallurgist) may be involved in this audio-video evaluation.   All laws concerning confidentiality and patient access to medical records and copies of medical records apply to telemedicine.  4.   Privacy: If this is a Radiographer, therapeutic Visit, the patient/ surrogate has received the [place] Notice of Privacy Practices via E-Checkin process.  For all other video visit techniques, I have verbally provided the patient/ surrogate with the [place] in [place] ([email or web address]) or Spanish ([email or web address]).  The patient/ surrogate acknowledges both being provided the NPP link, and has been offered to have the NPP mailed to the patient/ surrogate by [name] mail.  The patient/ surrogate has voiced understanding an acknowledgement of receipt of this NPP web address.  If the patient/surrogate has elected to receive the NPP via [name] mail, I verify that the NPP will be  sent promptly to the patient/surrogate via [name] mail.  5.   Capacity: I have reviewed this above verification and consent paragraph with the patient/ surrogate and the patient is capacitated or has a surrogate. If the patient is not capacitated to understand the above, and no surrogate is available, since this is not an emergency evaluation, the visit  will be rescheduled until such time that the patient can consent, or the surrogate is available to consent. If this is an emergency evaluation and the patient is not capacitated to understand the above, and no surrogate is available, I am proceeding with this evaluation as this is felt to be an emergency setting and no appropriate specialist is available at the bedside to perform these evaluations.  6.   Financial Waiver: If this is a Radiographer, therapeutic Visit, the patient has been made aware of the financial waiver via E-Checkin process.  For all other video visit techniques, an E-Checkin process is not performed.  As such, I have personally verbally informed the patient/ surrogate that this evaluation will be a billable encounter similar to an in-person clinic visit, and the patient/ surrogate has agreed to pay the fee for services rendered.  If we are billing insurance for the patient's telehealth visit, his out-of-pocket cost will be determined based on his plan and will be billed to him.  The patient/ surrogate has also been informed that if the patient does not have insurance or does not wish to use insurance, [place] [employer] price for a primary care telehealth visit is $59.00 and specialist telehealth visit is $88.00.  I have further informed the patient/ surrogate that in the event the patient has additional services provided in conjunction with the specialty visit (Ex. Psychotherapy services), those services will be billed at the current rate less a 45% discount.  7.   Intra-State Location: The patient/ surrogate attests to understanding that if the patient accesses these services from a location outside of [place], that the patient does so at the patient's own risk and initiative and that the patient is ultimately responsible for compliance with any laws or regulations associated with the patient's use.  8.   Specific Use:The patient/ surrogate understands that [facility] makes no representation that  materials or servicesdelivered via telecommunication services, or listed on telemedicine websites, are appropriate or available for use in any other location.           Demographics:  Medical Record #: [id number]  Date: May 24, 2021  Patient Name: Stephen [name]  DOB: 04-11-1951  Age: 70 year old  Sex: male  Location: Home address on file     Evaluator(s):  Tesean Stump was evaluated by me today.    Clinic Location: [facility]  [address]  [place] [address]      .

## 2021-05-26 ENCOUNTER — Other Ambulatory Visit: Payer: Self-pay

## 2021-05-27 ENCOUNTER — Ambulatory Visit
Admission: RE | Admit: 2021-05-27 | Discharge: 2021-05-27 | Disposition: A | Discharge status: Home / Self Care | Payer: Medicare Other | Attending: Nurse Practitioner | Admitting: Nurse Practitioner

## 2021-05-27 DIAGNOSIS — M549 Dorsalgia, unspecified: Secondary | ICD-10-CM | POA: Insufficient documentation

## 2021-05-27 DIAGNOSIS — M4317 Spondylolisthesis, lumbosacral region: Secondary | ICD-10-CM

## 2021-05-27 DIAGNOSIS — Z981 Arthrodesis status: Secondary | ICD-10-CM

## 2021-05-29 ENCOUNTER — Encounter (HOSPITAL_BASED_OUTPATIENT_CLINIC_OR_DEPARTMENT_OTHER): Payer: Self-pay | Admitting: Nurse Practitioner

## 2021-05-31 NOTE — Telephone Encounter (Signed)
05-24-21     Impression:  Post Op from Above Surgery with persistent pain.     Plan:  Continue physical therapy and HEP.  Will obtain an Xray. Will refer to pain clinic for consideration of injections.  F/u in 6 weeks

## 2021-05-31 NOTE — Telephone Encounter (Signed)
From: Lovena Le  To: Sheria Lang, NP  Sent: 05/29/2021 10:51 AM PDT  Subject: Question regarding X-RAY LUMBOSACRAL SPINE 2 OR 3 VIEWS    Thank you for your time and patience hope to you are feeling better what is a simple explanation from my X-ray that is causing my back discomfort at the base of my spine? 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

## 2021-06-01 ENCOUNTER — Other Ambulatory Visit: Payer: Self-pay

## 2021-06-01 ENCOUNTER — Ambulatory Visit (INDEPENDENT_AMBULATORY_CARE_PROVIDER_SITE_OTHER): Payer: Medicare Other

## 2021-06-01 DIAGNOSIS — I351 Nonrheumatic aortic (valve) insufficiency: Secondary | ICD-10-CM | POA: Diagnosis not present

## 2021-06-01 LAB — ECHOCARDIOGRAM COMPLETE
AR max vel: 2.31 cm2
AV Area VTI: 2.36 cm2
AV Area mean vel: 2.31 cm2
AV Mean grad: 16 mmHg
AV Peak grad: 27.7 mmHg
Ao pk vel: 2.63 m/s
Area-P 1/2: 2.29 cm2
Calc EF: 57.4 %
P 1/2 time: 646 msec
S' Lateral: 3.3 cm
Single Plane A2C EF: 58.9 %
Single Plane A4C EF: 57.6 %

## 2021-06-02 ENCOUNTER — Ambulatory Visit (INDEPENDENT_AMBULATORY_CARE_PROVIDER_SITE_OTHER): Payer: Medicare Other

## 2021-06-02 ENCOUNTER — Encounter (INDEPENDENT_AMBULATORY_CARE_PROVIDER_SITE_OTHER): Payer: Self-pay

## 2021-06-02 DIAGNOSIS — Z01818 Encounter for other preprocedural examination: Secondary | ICD-10-CM

## 2021-06-02 NOTE — Patient Instructions (Signed)
PREOPERATIVE SURGICAL INFORMATION     Your surgery is currently scheduled at [facility] on 06/16/21  The scheduler will be contacting you with the check in time      [facility], [place], [address], [place] [address]  Please check in at Patient Services in [place] on 1st floor     [facility] (including [facility]): [employer] structure, Microbiologist structure, or Dance movement psychotherapist parking (7am-5pm at [employer]; 5am-5pm at [employer]) for same cost as self-parking in the front entrance of the [facility]   [email or web address]     QUESTIONS    If you have any questions between now and the day of your surgery, please do not hesitate to call:     [facility]: [phone]     DAY OF SURGERY ARRIVAL TIME:    On the day of your Surgery/Procedure, please arrive at the time provided by the surgery/preop team. If you have any questions regarding your arrival time, please call:    Preoperative Surgical Admissions at [facility]:     OK to take the following medications as scheduled with a small sip of water on the morning of surgery: Amlodipine    PLEASE HOLD ALL NSAIDS (non-steroidal anti-inflammatory drugs) SUCH AS advil, aleve, motrin, ibuprofen, relafen, lodine, feldene, Diclofenac, voltaren, indomethacin, naproxen, celebrex, Mobic 7 days before surgery (starting on 06/09/21).       Please hold vitamins, supplements, herbs & fish oil 7 days before surgery (starting on 06/09/21).     It is OK to take acetaminophen (Tylenol) for pain around the time of surgery unless you have liver disease.      AFTER YOUR VISIT WITH [name], IF YOU START TAKING A NEW MEDICATION BEFORE SURGERY, PLEASE CALL us TO MAKE SURE IT IS SAFE TO TAKE & WILL NOT AFFECT YOUR  SURGERY.         OSA INSTRUCTIONS:     If you use a CPAP machine, please bring the entire CPAP machine, including mask and tubing, with you on the day of surgery.    TO DO LIST:         EATING/DRINKING     DO NOT EAT OR DRINK ANYTHING AFTER MIDNIGHT ON THE DAY OF SURGERY    Preparing for your Surgery:     Please wear clean loose-fitting clothes and leave valuables at home   Do not shave or remove body hair. Facial shaving is permitted. If you are having head surgery, ask your doctor whether you can shave.  Bring a picture ID and your insurance card, and be prepared to pay your deductible or co-insurance by cash, check, or credit card when you arrive.   If you are going home after your surgery, please make sure to arrange for an adult to drive you home. You CANNOT use UBER or LYFT. If you do not have a ride, your surgery may be cancelled.    Visitor policy during the WUXLK-44 pandemic is subject to change. Current visitor policy can be found at [email or web address]     On The Day of Your Surgery:      Check in at the location mentioned above   COVID testing may be done on arrival to the Pre-op  or Procedural areas according to the current CDPH mandate.   If you are a woman of child bearing age, please note that you may be asked to give a urine sample upon check-in  You will meet your anesthesia and surgery teams in the preoperative holding area before surgery.   Once surgery is over, you will wake up in the recovery room.  If you go home, an adult chaperone will need to stay with you for the first 24 hours after surgery.   Visitor policy during the YBOFB-51 pandemic is subject to change. Current visitor policy can be found at [email or web address]    A video about what to expect for the day of surgery can be found here:    [email or web address]  Or by searching You-tube for [place] before surgery and [place] after surgery     You medical  records are available to you at [email or web address]

## 2021-06-02 NOTE — Interdisciplinary (Signed)
Anesthesia Preparedness Clinic [facility]) RN PHONE CALL NOTE    Phone call to patient from [facility] RN today.     Confirmed medical history & that medications listed in Epic are accurate and up to date. Reviewed anesthesia notes from 8/00/34, no complications. Denies any SOB, chest pain, palpitations, dizziness or syncope. No changes to health since the last procedure 4 weeks ago. Pt c/o chronic back pain from the arthritis. Able to walk 2 blocks and climb 2 flights of stairs but its difficult d/t pain.     Patient scheduled for surgery on 06/16/21 at [facility].    Confirmed preoperative instructions with patient including NPO instructions.     Planning your surgery information sent to patient via mychart.    No questions or concerns at this time.

## 2021-06-03 ENCOUNTER — Other Ambulatory Visit: Payer: Self-pay

## 2021-06-03 NOTE — Telephone Encounter (Signed)
TC to pt, he was scheduled for 6/23. cx appt he was just seen 6/6, and told to come back in 6 weeks.     Per pt he sent mychart message regarding xray. Would like to know her thoughts, he is in a lot of pain. Should he get more PT, would that help?   Knows about the pain clinic referral scheduled w/[facility] 7/11. He just is just concerned about it more than just muscle related, wants to be sure nothing further is going on w/his back.

## 2021-06-04 ENCOUNTER — Encounter (HOSPITAL_BASED_OUTPATIENT_CLINIC_OR_DEPARTMENT_OTHER): Payer: Self-pay

## 2021-06-04 ENCOUNTER — Ambulatory Visit (HOSPITAL_BASED_OUTPATIENT_CLINIC_OR_DEPARTMENT_OTHER): Payer: Medicare Other | Admitting: Urology

## 2021-06-08 NOTE — Telephone Encounter (Signed)
Follow up with pain clinic. Return to our clinic in 3 months post last visit.

## 2021-06-09 ENCOUNTER — Encounter: Payer: Self-pay | Admitting: Hospital

## 2021-06-09 NOTE — Telephone Encounter (Signed)
Mellody Memos 6 days ago     LT       TC to pt, he was scheduled for 6/23. cx appt he was just seen 6/6, and told to come back in 6 weeks.     Per pt he sent mychart message regarding xray. Would like to know her thoughts, he is in a lot of pain. Should he get more PT, would that help?   Knows about the pain clinic referral scheduled w/[facility] 7/11. He just is just concerned about it more than just muscle related, wants to be sure nothing further is going on w/his back.          Documentation

## 2021-06-10 ENCOUNTER — Ambulatory Visit (HOSPITAL_BASED_OUTPATIENT_CLINIC_OR_DEPARTMENT_OTHER): Payer: Medicare Other | Admitting: Nurse Practitioner

## 2021-06-11 ENCOUNTER — Encounter (HOSPITAL_BASED_OUTPATIENT_CLINIC_OR_DEPARTMENT_OTHER): Payer: Self-pay | Admitting: Nurse Practitioner

## 2021-06-15 ENCOUNTER — Other Ambulatory Visit (HOSPITAL_BASED_OUTPATIENT_CLINIC_OR_DEPARTMENT_OTHER): Payer: Self-pay | Admitting: Urology

## 2021-06-15 ENCOUNTER — Telehealth (HOSPITAL_BASED_OUTPATIENT_CLINIC_OR_DEPARTMENT_OTHER): Payer: Self-pay | Admitting: Urology

## 2021-06-15 DIAGNOSIS — R52 Pain, unspecified: Secondary | ICD-10-CM

## 2021-06-15 NOTE — Telephone Encounter (Signed)
BUMPED FROM DR. BAGRODIA'S 07/08 SCHEDULE    Left msg for pt advising of new appt time at 4:15 PM on July 8.    Request call back at [phone] to reschedule if needed.    Sent MyChart msg to patient as well

## 2021-06-15 NOTE — Anesthesia Preprocedure Evaluation (Addendum)
ANESTHESIA PRE-OPERATIVE EVALUATION    Patient Information    Name: Stephen [name]    MRN: [id number]    DOB: Jan 15, 1951    Age: 70 year old    Sex: male  Procedure(s):  CT WITH ANESTHESIA UROGRAPHY      Pre-op Vitals:   BP 145/76 (BP Location: Right arm, BP Patient Position: Semi-Fowlers)    Pulse 54    Temp 36.4 C    Resp 16    Ht '5\' 6"'  (1.676 m)    Wt 92.7 kg (204 lb 5.9 oz)    SpO2 98%    BMI 32.99 kg/m    BMI kg/m2: 32.99 kg/m2    Primary language spoken:  English    ROS/Medical History:      History of Present Illness: Stephen [name] is a 70 year old male with congenital hydronephrosis at 70 yo, TURP and recurrent BNC previously managed with [facility], then SPT, now s/p DVIU with [facility] 12/07/2017. Hx of extensive lumbar-sacral spinal fusion.    Pt now s/f Diagnostic ureteroscopy with laser, ureteral stent placement, gemcitabine instillation, possible biopsy, LEFT ureter    General:  positive for Obesity,  able to climb flight of stairs/Exercise tolaerance >4 mets,  Previous airway note:    05/05/21;; Placed By Anesthesia, Insertion Attempts 1; Laryngoscope View Grade 1; Laryngoscopy Technique Glidescope (T3); Airway Device ETT - Cuffed; Size (mm) 7.0 Cardiovascular:  no CAD/Angina/MI/CABG/Stents, Anti-platelet drugs: No,   no LV failure/CHF,   hypertension,  Walks to house and yard, stairs 2/2 back pain he is limited. Denies SOB or C/P.    Baseline HR 40-50's on monitor.   Anesthesia History:  negative anesthesia history ROS  no history of anesthetic complications,  no PONV,  chronic pain patient,  Previous Grade III needed bougie Pulmonary:   negative pulmonary ROS  no asthma,  no COPD,  no sleep apnea,     Neuro/Psych:   negative for TIA/CVA,  no seizures,  psychiatric history,  Multiple spinal surgeries Hematology/Oncology:   anemia (on past labs),      GI/Hepatic:  GERD,  no liver disease,   Infectious Disease:     Renal:  H/o urethral stricture and TURP in the past. Hx of prior nephrectomy 2/2 congenital  hydronephrosis.     BLE edematous and erythemic.  Endocrine/Other:  no diabetes,  arthritis,   back pain,  Hx of lumbar-sacral spinal fusion; multiple back surgeries.   Pregnancy History:   Pediatrics:         Pre Anesthesia Testing (PCC/CPC) notes/comments:                 Physical Exam    Airway:      Comment: Pt previously intubated with a glidescope for spinal surgery; G1 view per notes. Inter-inciser distance 3-4 cm    Mallampati: III  Neck ROM: limited  TM distance: 5-6 cm  Short thick neck: No          Cardiovascular:  - cardiovascular exam normal   Rhythm: regular   Rate: normal         Pulmonary:  - pulmonary exam normal           Neuro/Neck/Skeletal/Skin:    Comment: psoriasis  Positive for Skin abnormalities and Skeletal abnormalities       Dental:    Comment: Multiple missing; extremely poor dentition, with many missing teeth and all remaining dentition shows active decay with enamel fractures.      Abdominal:  General: obesity     Additional Clinical Notes:               Last  OSA (STOP BANG) Score:  No data recorded    Last OSA  (STOP) Score for   Has a physician diagnosed you with sleep apnea?: No  Do you use a CPAP at home?: No        Has a physician diagnosed you with sleep apnea?: No  Do you use a CPAP at home?: No  OSA total score (A score of 2 or more is high risk. Offer patient sleep study.): 0    Past Medical History:   Diagnosis Date    Chronic back pain     Congenital hydronephrosis     Gout     Headache     Hematuria     Kidney disease     Kidney stones     Major depressive disorder, single episode     Polyarthropathy or polyarthritis of multiple sites     Retinal detachment     Urethral stricture      Past Surgical History:   Procedure Laterality Date    CT INSERTION OF SUPRAPUBIC CATH  09/25/2015    NEPHRECTOMY Right 1995    APPENDECTOMY      COLONOSCOPY      CYSTOSCOPY      CYSTOSCOPY W/ LASER LITHOTRIPSY      OTHER SURGICAL HISTORY      Interstim 01/29/2011     SPINE SURGERY  09/21    Lumbar-sacral fusion    TRANSURETHRAL RESECTION OF PROSTATE       Social History     Socioeconomic History    Marital status: Single   Tobacco Use    Smoking status: Never Smoker    Smokeless tobacco: Never Used   Substance and Sexual Activity    Alcohol use: No    Drug use: Not Currently    Sexual activity: Not Currently     Partners: Female   Other Topics Concern    Military Service No    Blood Transfusions Yes    Caffeine Concern No    Occupational Exposure No    Hobby Hazards No    Sleep Concern Yes     Comment: due to my lower back discomfort and leg discomfort    Stress Concern Yes     Comment: Health/ housing/ financial    Weight Concern No    Special Diet No    Back Care Yes     Comment: Am very careful with any activity    Exercises Regularly Yes    Seat Belt Use Yes    Performs Self-Exams Yes    Bike Helmet Use No     Alcohol Use: Not on file       Current Facility-Administered Medications   Medication Dose Route Frequency Provider Last Rate Last Admin    ceFAZolin in dextrose (ANCEF) IVPB 2,000 mg  2,000 mg IntraVENOUS Once Carlota Raspberry, MD        gemCITAbine (GEMZAR) 1,000 mg in sterile water (PF) 50 mL chemo instillation  1,000 mg IntraVESICAL Once Carlota Raspberry, MD        heparin injection 5,000 Units  5,000 Units Subcutaneous Once Carlota Raspberry, MD        iohexol (OMNIPAQUE 240) 240 MG/ML 10 mL in sodium chloride 0.9 % 10 mL solution    Intra-Op PRN Carlota Raspberry, MD   Given at 06/16/21 1657  lactated ringers infusion   IntraVENOUS Continuous Carlota Raspberry, MD 30 mL/hr at 06/16/21 1434 New Bag at 06/16/21 1434     Facility-Administered Medications Ordered in Other Encounters   Medication Dose Route Frequency Provider Last Rate Last Admin    ceFAZolin (ANCEF) injection   IntraVENOUS Intra-Op PRN Terrance Mass, CRNA   2,000 mg at 06/16/21 1647    dexAMETHasone (DECADRON) injection   IntraVENOUS Intra-Op PRN Terrance Mass, CRNA   4 mg at 06/16/21 1644    fentaNYL injection   IntraVENOUS Intra-Op PRN Terrance Mass, CRNA   100 mcg at 06/16/21 1637    lactated ringers infusion   IntraVENOUS Intra-Op Continuous Terrance Mass, CRNA   New Bag at 06/16/21 1634    lidocaine 2% injection   IntraVENOUS Intra-Op PRN Terrance Mass, CRNA   100 mg at 06/16/21 1637    midazolam (VERSED) injection   IntraVENOUS Intra-Op PRN Terrance Mass, CRNA   2 mg at 06/16/21 1637    ondansetron (ZOFRAN) injection   IntraVENOUS Intra-Op PRN Terrance Mass, CRNA   4 mg at 06/16/21 1656    propofol (DIPRIVAN) injection (200 mg/20 mL)   IntraVENOUS Intra-Op PRN Terrance Mass, CRNA   150 mg at 06/16/21 1637    rocuronium (ZEMURON) injection   IntraVENOUS Intra-Op PRN Terrance Mass, CRNA   50 mg at 06/16/21 1638     Allergies   Allergen Reactions    Amoxicillin Rash    Sulfa Drugs Unspecified       Labs and Other Data  Lab Results   Component Value Date    NA 139 04/28/2021    K 4.3 04/28/2021    CL 102 04/28/2021    BICARB 24 04/28/2021    BUN 21 04/28/2021    CREAT 1.25 (H) 04/28/2021    GLU 96 04/28/2021    CA 10.0 04/28/2021     Lab Results   Component Value Date    AST 27 08/18/2020    ALT 9 08/18/2020    ALK 64 08/18/2020    TP 6.8 08/18/2020    ALB 4.7 08/18/2020    TBILI 1.00 08/18/2020     Lab Results   Component Value Date    WBC 15.0 (H) 08/21/2020    RBC 3.73 (L) 08/21/2020    HGB 11.2 (L) 08/21/2020    HCT 33.4 (L) 08/21/2020    MCV 89.5 08/21/2020    MCHC 33.5 08/21/2020    RDW 14.9 (H) 08/21/2020    PLT 166 08/21/2020    MPV 11.2 08/21/2020    SEG 89 08/18/2020    LYMPHS 6 08/18/2020    MONOS 5 08/18/2020    EOS 0 08/18/2020    BASOS 0 08/18/2020     Lab Results   Component Value Date    INR 1.2 08/18/2020    PTT 28 08/18/2020     Lab Results   Component Value Date    ARTPH 7.42 08/18/2020    ARTPO2 218 (H) 08/18/2020    ARTPCO2 35 (L) 08/18/2020        Anesthesia Plan:  Risks and Benefits of Anesthesia  I have personally performed an appropriate pre-anesthesia physical exam of the patient (including heart, lungs, and airway) prior to the anesthetic and reviewed the pertinent medical history, drug and allergy history, laboratory and imaging studies and consultations.   I have determined that the patient has had adequate assessment and testing.  I have  validated the documentation of these elements of the patient exam and/or have made necessary changes to reflect my own observations during my pre-anesthesia exam.  Anesthetic techniques, invasive monitors, anesthetic drugs for induction, maintenance and post-operative analgesia, risks and alternatives have been explained to the patient and/or patient's representatives.    I have prescribed the anesthetic plan:         Planned anesthesia method: General         ASA 3 (Severe systemic disease)     Potential anesthesia problems identified and risks including but not limited to the following were discussed with patient and/or patient's representative: Adverse or allergic drug reaction, Ocular injury, Dental injury or sore throat, Injury to brain, heart and other organs, Death, Recall, Nerve injury and Administration of blood products    No Beta Blocker Indicated: Does not meet criteria and Patient not on beta blockers    Planned monitoring method: Routine monitoring    Informed Consent:  Anesthetic plan and risks discussed with Patient.    Plan discussed with Surgeon, CRNA and Attending.

## 2021-06-16 ENCOUNTER — Other Ambulatory Visit: Payer: Self-pay

## 2021-06-16 ENCOUNTER — Ambulatory Visit
Admission: RE | Admit: 2021-06-16 | Discharge: 2021-06-16 | Disposition: A | Discharge status: Home / Self Care | Payer: Medicare Other | Attending: Urology | Admitting: Urology

## 2021-06-16 ENCOUNTER — Ambulatory Visit (HOSPITAL_BASED_OUTPATIENT_CLINIC_OR_DEPARTMENT_OTHER): Payer: Medicare Other

## 2021-06-16 ENCOUNTER — Ambulatory Visit (HOSPITAL_BASED_OUTPATIENT_CLINIC_OR_DEPARTMENT_OTHER): Payer: Medicare Other | Admitting: Certified Registered Nurse Anesthetist

## 2021-06-16 ENCOUNTER — Ambulatory Visit (HOSPITAL_COMMUNITY): Payer: Medicare Other | Admitting: Certified Registered Nurse Anesthetist

## 2021-06-16 ENCOUNTER — Encounter (HOSPITAL_COMMUNITY): Admission: RE | Disposition: A | Discharge status: Home Health | Payer: Self-pay | Attending: Urology

## 2021-06-16 DIAGNOSIS — Z881 Allergy status to other antibiotic agents status: Secondary | ICD-10-CM | POA: Insufficient documentation

## 2021-06-16 DIAGNOSIS — Z466 Encounter for fitting and adjustment of urinary device: Secondary | ICD-10-CM | POA: Insufficient documentation

## 2021-06-16 DIAGNOSIS — N2889 Other specified disorders of kidney and ureter: Secondary | ICD-10-CM

## 2021-06-16 DIAGNOSIS — Z882 Allergy status to sulfonamides status: Secondary | ICD-10-CM | POA: Insufficient documentation

## 2021-06-16 DIAGNOSIS — C652 Malignant neoplasm of left renal pelvis: Secondary | ICD-10-CM

## 2021-06-16 DIAGNOSIS — R52 Pain, unspecified: Secondary | ICD-10-CM

## 2021-06-16 DIAGNOSIS — Z905 Acquired absence of kidney: Secondary | ICD-10-CM | POA: Insufficient documentation

## 2021-06-16 DIAGNOSIS — C662 Malignant neoplasm of left ureter: Secondary | ICD-10-CM

## 2021-06-16 DIAGNOSIS — Z79899 Other long term (current) drug therapy: Secondary | ICD-10-CM | POA: Insufficient documentation

## 2021-06-16 LAB — COVID-19 BINAXNOW ANTIGEN (POCT): COVID-19 Antigen (POCT): NEGATIVE

## 2021-06-16 SURGERY — URETEROSCOPY, NON-STONE
Anesthesia: General | Site: Ureter | Laterality: Left

## 2021-06-16 MED ORDER — FENTANYL CITRATE (PF) 100 MCG/2ML IJ SOLN
INTRAMUSCULAR | Status: AC
Start: 2021-06-16 — End: ?
  Filled 2021-06-16: qty 2

## 2021-06-16 MED ORDER — MIDAZOLAM HCL 2 MG/2ML IJ SOLN
INTRAMUSCULAR | Status: AC
Start: 2021-06-16 — End: ?
  Filled 2021-06-16: qty 2

## 2021-06-16 MED ORDER — ONDANSETRON HCL 4 MG/2ML IV SOLN
INTRAMUSCULAR | Status: DC | PRN
Start: 2021-06-16 — End: 2021-06-16
  Administered 2021-06-16: 4 mg via INTRAVENOUS

## 2021-06-16 MED ORDER — SUGAMMADEX SODIUM 200 MG/2ML IV SOLN
INTRAVENOUS | Status: AC
Start: 2021-06-16 — End: ?
  Filled 2021-06-16: qty 2

## 2021-06-16 MED ORDER — LACTATED RINGERS IV SOLN
INTRAVENOUS | Status: DC
Start: 2021-06-16 — End: 2021-06-17

## 2021-06-16 MED ORDER — NALOXONE HCL 0.4 MG/ML IJ SOLN
0.1000 mg | INTRAMUSCULAR | Status: DC | PRN
Start: 2021-06-16 — End: 2021-06-17

## 2021-06-16 MED ORDER — IOHEXOL 240 MG/ML IJ SOLN
INTRAMUSCULAR | Status: DC | PRN
Start: 2021-06-16 — End: 2021-06-16

## 2021-06-16 MED ORDER — FENTANYL CITRATE (PF) 250 MCG/5ML IJ SOLN
INTRAMUSCULAR | Status: DC | PRN
Start: 2021-06-16 — End: 2021-06-16
  Administered 2021-06-16: 100 ug via INTRAVENOUS

## 2021-06-16 MED ORDER — LIDOCAINE HCL 2% EX GEL (UROJET)
Status: AC
Start: 2021-06-16 — End: ?
  Filled 2021-06-16: qty 10

## 2021-06-16 MED ORDER — ACETAMINOPHEN 325 MG PO TABS
975.0000 mg | ORAL_TABLET | Freq: Once | ORAL | Status: AC
Start: 2021-06-16 — End: 2021-06-16
  Administered 2021-06-16: 975 mg via ORAL
  Filled 2021-06-16: qty 3

## 2021-06-16 MED ORDER — FENTANYL CITRATE (PF) 50 MCG/ML IJ SOLN (WRAPPED RECORD) ~~LOC~~
25.0000 ug | INTRAMUSCULAR | Status: DC | PRN
Start: 2021-06-16 — End: 2021-06-17

## 2021-06-16 MED ORDER — DIPHENHYDRAMINE HCL 50 MG/ML IJ SOLN
12.5000 mg | Freq: Once | INTRAMUSCULAR | Status: DC | PRN
Start: 2021-06-16 — End: 2021-06-17

## 2021-06-16 MED ORDER — LIDOCAINE HCL 2 % IJ SOLN WRAPPED RECORD
INTRAMUSCULAR | Status: DC | PRN
Start: 2021-06-16 — End: 2021-06-16
  Administered 2021-06-16 (×2): 100 mg via INTRAVENOUS

## 2021-06-16 MED ORDER — CEFAZOLIN SODIUM 1 GM IJ SOLR
INTRAMUSCULAR | Status: DC | PRN
Start: 2021-06-16 — End: 2021-06-16
  Administered 2021-06-16: 2000 mg via INTRAVENOUS

## 2021-06-16 MED ORDER — ROCURONIUM BROMIDE 100 MG/10ML IV SOLN
INTRAVENOUS | Status: DC | PRN
Start: 2021-06-16 — End: 2021-06-16
  Administered 2021-06-16: 50 mg via INTRAVENOUS

## 2021-06-16 MED ORDER — CEFAZOLIN SODIUM-DEXTROSE 2-4 GM/100ML-% IV SOLN
2000.0000 mg | Freq: Once | INTRAVENOUS | Status: DC
Start: 2021-06-16 — End: 2021-06-17

## 2021-06-16 MED ORDER — LACTATED RINGERS IV SOLN
INTRAVENOUS | Status: DC | PRN
Start: 2021-06-16 — End: 2021-06-16

## 2021-06-16 MED ORDER — HEPARIN SODIUM (PORCINE) 5000 UNIT/ML IJ SOLN
5000.0000 [IU] | Freq: Once | INTRAMUSCULAR | Status: DC
Start: 2021-06-16 — End: 2021-06-17

## 2021-06-16 MED ORDER — PHENAZOPYRIDINE HCL 200 MG OR TABS
200.0000 mg | ORAL_TABLET | Freq: Three times a day (TID) | ORAL | 0 refills | Status: DC
Start: 2021-06-16 — End: 2021-08-05
  Filled 2021-06-16: qty 6, 2d supply, fill #0

## 2021-06-16 MED ORDER — FENTANYL CITRATE (PF) 50 MCG/ML IJ SOLN (WRAPPED RECORD) ~~LOC~~
50.0000 ug | INTRAMUSCULAR | Status: DC | PRN
Start: 2021-06-16 — End: 2021-06-17

## 2021-06-16 MED ORDER — MIDAZOLAM HCL 2 MG/2ML IJ SOLN
INTRAMUSCULAR | Status: DC | PRN
Start: 2021-06-16 — End: 2021-06-16
  Administered 2021-06-16: 2 mg via INTRAVENOUS

## 2021-06-16 MED ORDER — ONDANSETRON HCL 4 MG/2ML IV SOLN
4.0000 mg | Freq: Once | INTRAMUSCULAR | Status: DC | PRN
Start: 2021-06-16 — End: 2021-06-17

## 2021-06-16 MED ORDER — PROPOFOL 200 MG/20ML IV EMUL
INTRAVENOUS | Status: DC | PRN
Start: 2021-06-16 — End: 2021-06-16
  Administered 2021-06-16 (×2): 150 mg via INTRAVENOUS

## 2021-06-16 MED ORDER — SUGAMMADEX SODIUM 200 MG/2ML IV SOLN
INTRAVENOUS | Status: DC | PRN
Start: 2021-06-16 — End: 2021-06-16
  Administered 2021-06-16: 200 mg via INTRAVENOUS

## 2021-06-16 MED ORDER — IOHEXOL 240 MG/ML IJ SOLN
INTRAMUSCULAR | Status: AC
Start: 2021-06-16 — End: ?
  Filled 2021-06-16: qty 20

## 2021-06-16 MED ORDER — DEXAMETHASONE SODIUM PHOSPHATE 4 MG/ML IJ SOLN (CUSTOM)
INTRAMUSCULAR | Status: DC | PRN
Start: 2021-06-16 — End: 2021-06-16
  Administered 2021-06-16 (×2): 4 mg via INTRAVENOUS

## 2021-06-16 MED ORDER — STERILE WATER FOR INJECTION IJ SOLN
1000.0000 mg | Freq: Once | INTRAVENOUS | Status: AC
Start: 2021-06-16 — End: 2021-06-16
  Administered 2021-06-16 (×2): 1000 mg via INTRAVESICAL
  Filled 2021-06-16: qty 1000

## 2021-06-16 SURGICAL SUPPLY — 35 items
BAG CYSTO DRAIN SKYTRON TABLE (Misc Surgical Supply) ×2 IMPLANT
BAG URINE DRAIN 200Ml (Lines/Drains) ×2
BASKET SEGURA RETRIEVAL 2.4F X 120CM X 16MM 0 TIP (Misc Medical Supply) ×2 IMPLANT
CATHETER FOLEY BARDEX I.C. 16FR 5CC, 2-WAY (Lines/Drains) ×2 IMPLANT
CATHETER PLUG WITH DRAINAGE TUBE COVER (Misc Medical Supply) ×2
CATHETER URETERAL OPEN END 5FR X 70CM (Procedural wires/sheaths/catheters/balloons/dilators) ×2
CATHETER URETHRAL DUAL LUMEN 10 FR X 54 CM (Lines/Drains) ×2 IMPLANT
CONTAINER PRECISION SPECIMEN, 4OZ- STERILE (Misc Medical Supply) ×2
DRAIN BAG URINE 2000 ML (Lines/Drains) ×1 IMPLANT
DRAPE X-RAY C ARM 41" X 74" (Drapes/towels) IMPLANT
DRESSING TELFA STRL 3" X 8" (Dressings/packing) ×2 IMPLANT
FEE RENTAL HOLMIUM LASER (Services) ×2
FIBER (Laser Fibers/Probes) ×2 IMPLANT
FORCEP BIOPSY BIGOPSY BACKLOADING 2.4FR X 115CM (Procedural wires/sheaths/catheters/balloons/dilators) IMPLANT
FORCEPS BIOPSY URETEROSCOPIC PIRANHA 3F 1.1MM X 115 CM FLEXIBLE (Procedural wires/sheaths/catheters/balloons/dilators)
FUEL SURCHARGE (Services) ×2 IMPLANT
GLOVE BIOGEL PI INDICATOR SIZE 6 (Gloves/Gowns) ×2
GLOVE SURGEON BIOGEL SIZE 7.5 (Gloves/Gowns) ×2 IMPLANT
GLOVE SURGICAL BIOGEL SIZE 6 (Gloves/Gowns) ×2
GOWN SURGICAL ULTRA LG BLUE, AAMI LVL 3 (Gloves/Gowns) ×2 IMPLANT
GUIDEWIRE SENSOR DUAL FLEX STRAIGHT TIP .035 X 150CM NITINOL (Procedural wires/sheaths/catheters/balloons/dilators) ×2 IMPLANT
GUIDEWIRE SENSOR STRAIGHT .038 X 150CMNITINOL (Procedural wires/sheaths/catheters/balloons/dilators) ×2 IMPLANT
HOLDER FOLEY CATH SECUREMENT DEVICE STATLOCK (Patient Care Supply) IMPLANT
NEEDLE PROTECT 22G X 1.5" (Needles/punch/cannula/biopsy) IMPLANT
SCRUB, SURGICAL, EXIDINE, 4%, CHG, 4OZ (Prep Solutions) ×2
SHEATH URETERAL NAVAGATOR HD 12/14 FR X 36CM (Procedural wires/sheaths/catheters/balloons/dilators) ×2 IMPLANT
SLEEVE SCD KNEE MEDIUM (Patient Care Supply) ×2
SOLUTION IRR BAG .9% N/S 3000ML (Non-Pharmacy Meds/Solutions) ×4
SOLUTION IRR POUR BTL 0.9% NS 1000ML (Non-Pharmacy Meds/Solutions) ×2
SOLUTION IRR POUR BTL H20 1000ML (Non-Pharmacy Meds/Solutions) ×2 IMPLANT
STENT ASCERTA FIRM 6FR X 26CM (Ureteral Stents) ×2 IMPLANT
SURGICAL PACK CYSTO - SAME DAY (Procedure Packs/kits) ×2 IMPLANT
TOWELS OR BLUE 4-PACK STERILE, DISPOSABLE (Drapes/towels) ×2 IMPLANT
TUBING SUCTION MEDI-VAC 9/32" X 20' (Tubing/Suction) ×2 IMPLANT
VALVE ENDOSCOPIC ADJUSTABLE UROSEAL (Misc Surgical Supply) ×2 IMPLANT

## 2021-06-16 NOTE — Brief Op Note (Signed)
BRIEF OPERATIVE NOTE     DATE: 06/16/2021  TIME: 6:22 PM    CASE ID: [id number]    PREOPERATIVE DIAGNOSIS: Left renal mass [N28.89]    POSTOPERATIVE DIAGNOSIS: same    PROCEDURE:   Procedure(s):  Diagnostic ureteroscopy with laser, left ureteral stent placement, gemcitabine instillation, biopsy-LEFT - Wound Class: N/A - Incision Closure: No Incision / NA  * Missing case tracking time(s) *    SURGEONS:  Surgeon(s) and Role:     * Carlota Raspberry, MD - Primary     * Aleksei Goodlin, Tawny Hopping, MD - Resident - Assisting     * Sarajane Marek, MD - Resident - Assisting     * Penny Pia, MD - Fellow    NURSING:  Circulator: Ruthy Dick, RN; Jory Sims, RN  Scrub: Lois Huxley Dysico, RN; [place], [facility]  X-Ray Tech: Natasha Bence, Leanna Sato    ASSISTANT(S):      ANESTHESIA:   * No anesthesia type entered *  Anesthesiologist: Ancil Linsey, MD; Delos Haring, MD  CRNA: Terrance Mass, CRNA     FINDINGS:   1. 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 Placement of 6x26Fr JJ stent  3. Instillation of 60cc gemcitabine intervesicularly      WOUND CLASSIFICATION:  Procedure(s):  Diagnostic ureteroscopy with laser, left ureteral stent placement, gemcitabine instillation, biopsy-LEFT - Wound Class: N/A - Incision Closure: No Incision / NA    WOUND CLOSURE STATUS:  Procedure(s):  Diagnostic ureteroscopy with laser, left ureteral stent placement, gemcitabine instillation, biopsy-LEFT - Wound Class: N/A - Incision Closure: No Incision / NA    SPECIMENS:  ID Type Source Tests Collected by Time Destination   A : left ureteral tumor Tissue Ureter, Left PATHOLOGY TISSUE Veronia Beets, MD 06/16/2021 1707        IMPLANTS:     Implant Name Type Inv. Item Serial No. Manufacturer Lot No. LRB No. Used Action   STENT ASCERTA FIRM 6FR X [id number] - [id number] Ureteral Stents STENT ASCERTA FIRM 6FR X [id number]  [employer]  [id number] Left 1 Implanted       Fluids/Blood Products:      IV Fluids: 900cc crystalloid    Blood Products: None    EBL: 5cc    Urine Output: None    COMPLICATIONS: None    DISPOSITION:   Gemcitabine instillation x1 hr  Discharge to home      Alinda Dooms, MD  Urology PGY1  Pager: [phone]

## 2021-06-16 NOTE — Anesthesia Postprocedure Evaluation (Signed)
Anesthesia Post Note    Patient: Stephen [name]    Procedure(s) Performed: Procedure(s):  Diagnostic ureteroscopy with laser, left ureteral stent placement, gemcitabine instillation, biopsy-LEFT      Final anesthesia type: General    Patient location: PACU    Post anesthesia pain: adequate analgesia    Mental status: awake, alert  and oriented    Airway Patent: Yes    Last Vitals:   Vitals Value Taken Time   BP 131/71 06/16/21 2030   Temp 36.4 C 06/16/21 1900   Pulse 71 06/16/21 2031   Resp 19 06/16/21 2031   SpO2 93 % 06/16/21 2031   Vitals shown include unvalidated device data.     Post vital signs: stable    Hydration: adequate    N/V:no    Anesthetic complications: no    Plan of care per primary team.

## 2021-06-16 NOTE — Plan of Care (Signed)
Problem: Promotion of Perioperative Health and Safety  Goal: Promotion of Health and Safety of the Perioperative Patient  Description: The patient remains safe, receives treatment appropriate to the surgical intervention and patient's physiological needs and is discharged or transferred to the appropriate level of care.    Information below is the current care plan.  Outcome: Resolved  Flowsheets  Taken 06/16/2021 1904  Guidelines: PACU  Individualized Interventions/Recommendations #1: anticipate needs  Individualized Interventions/Recommendations #2 (if applicable): contact medical transport  Taken 06/16/2021 1830  Patient /Family stated Goal: To know why its different than last time

## 2021-06-16 NOTE — H&P (Signed)
HISTORY & PHYSICAL - INTERVAL ASSESSMENT    **ONLY TO BE USED IN ADDITION TO A HISTORY & PHYSICAL**    Camila Boyd Yiu  [id number]      This interval H&P update references the history and physical documentation from this date:  05/18/21    Current Medical Status:  Unchanged    Medications / Allergies:  Unchanged    Review of Systems:  Unchanged    Physical Examination:  BP 145/76 (BP Location: Right arm, BP Patient Position: Semi-Fowlers)   Pulse 54   Temp 97.5 F (36.4 C)   Resp 16   Ht 5' 6" (1.676 m)   Wt 92.7 kg (204 lb 5.9 oz)   SpO2 98%   BMI 32.99 kg/m   I have examined the patient today.  Unchanged    Laboratory or Clinical Data:  Lab Results   Component Value Date    CO19 Not Detected 08/15/2020     Lab Results   Component Value Date    NA 139 04/28/2021    K 4.3 04/28/2021    CL 102 04/28/2021    BICARB 24 04/28/2021    BUN 21 04/28/2021    CREAT 1.25 (H) 04/28/2021    GLU 96 04/28/2021    CA 10.0 04/28/2021     Lab Results   Component Value Date    WBC 15.0 (H) 08/21/2020    RBC 3.73 (L) 08/21/2020    HGB 11.2 (L) 08/21/2020    HCT 33.4 (L) 08/21/2020    MCV 89.5 08/21/2020    MCHC 33.5 08/21/2020    RDW 14.9 (H) 08/21/2020    PLT 166 08/21/2020    MPV 11.2 08/21/2020    SEG 89 08/18/2020    LYMPHS 6 08/18/2020    MONOS 5 08/18/2020    EOS 0 08/18/2020    BASOS 0 08/18/2020     Lab Results   Component Value Date    AST 27 08/18/2020    ALT 9 08/18/2020    ALK 64 08/18/2020    TP 6.8 08/18/2020    ALB 4.7 08/18/2020    TBILI 1.00 08/18/2020     No results found for: INR, PTT      Modifications of Initial Care Plan:  The care plan has been discussed with the attending physician of record and remains unchanged. This was discussed with the patient today on the morning of the procedure. The patient understands the current plan and verbally consents to the proposed treatment.     Margaret Frances Meagher     06/16/21     3:40 PM

## 2021-06-16 NOTE — Discharge Instructions (Signed)
Discharge Instructions Following Ureteroscopy      It is normal to have some burning when you urinate.      It is common to have blood in the urine after this procedure. It may be pink or even red; inform your doctor if you have a significant amount of clots in the urine or if you are unable to urinate at all. Be sure to drink plenty of fluids at all times.    You may have an internal stent (a hollow tube that runs from the kidney to your bladder), helping the urine to drain down from the kidney to your bladder after your surgery. Some patients do not notice that they have a stent, while others complain of the sensation of needing to urinate frequently, burning on urination, or even some back pain (especially when they go to urinate). These sensations usually improve gradually, some faster than others. This is not uncommon, but may initially warrant the use of pain medication, which you were prescribed. While the stent is in place, your urine may continue to be bloody. Your urologist will remove this stent as an outpatient. Occasionally these stents are left with a string exiting your urethra, so they can be removed by yourself at home. If this is the case, you will be instructed as to when you should remove the stent.     Some patients are sent home with a foley catheter (a hollow tube which drains urine from your bladder), while others go home urinating on their own. If you still have a catheter, you will be provided with instructions regarding its care.     Drink at least 6-8 glasses of fluid per day; minimize night-time drinking if this causes you to awaken regularly to urinate.    You may resume your regular diet and regular medication regimen.     You may shower or bathe.     You will be given a prescription for multiple pain medications; Take these medications as prescribed. Taking multiple different types of pain medications has been shown to work better than a stronger narcotic pain medication for these  types of procedures. Generally, taking Extra Strength Tylenol around the clock is adequate. If you have severe pain that does not improve with the pain medication, or you have persistent vomiting, call your doctor. If you have been provided narcotic pain medications, stop taking them as soon as possible. Also, narcotic pain medication can cause constipation, so take a stool softener (Docusate) while taking these. Do not operate heavy machinery or drive while taking narcotics.    Additional medications for pain and stent discomfort that you may be prescribed: Take oxybutynin for bladder spasms (side effects include dry eyes, dry mouth, constipation). Take pyridium for burning pain in your urethra. This will cause your urine to turn , which is completely normal. You may also be provided with Tamsulosin, which helps relax the urinary tract.    As you have just undergone general anesthesia, you should refrain from driving, heavy lifting, smoking, alcohol consumption, or important decision making for the next 24 hours. You may climb stairs and you may resume sexual activity.     Call your physician if you have a fever over 101F.     Make a follow up appointment with your urologist. A clinic scheduler should call you within the next three business days to schedule an appointment. If you do not hear from somebody, please call the number listed below.     For all   other questions, contact the urology clinic using the following numbers.      Urology Contact Information:     If you have any questions about your hospital care, your medications, or if you have new or concerning symptoms soon after going home, and you need to contact your hospital physician, your hospital physician can be contacted in the following manner:    Business Hours (Monday - Friday; 8:00 AM - 4:30 PM): [phone]    After-Hours, Weekends/Holidays:  FOR EMERGENCY ISSUES ONLY, call [phone] and ask them to page the on-call Urologist. If it  is not an emergency, then wait for business hours and call one of the numbers above.    If your clinic appointment has not been scheduled before you leave the hospital and you do not receive a call within 2 business days, call the clinic scheduler for your urologist's clinic to schedule the appointment.

## 2021-06-17 NOTE — Op Note (Signed)
UROLOGY OPERATIVE REPORT    PATIENT NAME: Stephen [name]    MRN: [id number]    DATE OF OPERATION: 06/16/21    CASE ID: '@[email or web address]' @    ATTENDING SURGEON: [id number]    ASSISTANT SURGEON(S):   1. Alinda Dooms, MD  2. Sarajane Marek, MD    PREOPERATIVE DIGNOSIS: Upper tract urothelial carcinoma    POSTOPERATIVE DIAGNOSIS: Upper tract urothelial carcinoma    PROCEDURE(s):   1. Diagnostic ureteroscopy with laser, left ureteral stent placement, gemcitabine instillation, biopsy    ANESTHESIA: General    ANTIBIOTICS: Ancef     OPERATIVE FINDINGS:  1. 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 Placement of 6x26Fr JJ stent  3. Instillation of 60cc gemcitabine intervesicularly      INDICATIONS FOR PROCEDURE:  This is a 70 year old male with history of solitary kidney with UTUC presenting for endoscopic management.    PROCEDURE IN DETAIL:  The patient was met and examined in the preoperative area. The indications, risks, benefits, and alternatives were explained to the patient.  All questions were answered and informed consent was obtained. The patient was then brought to the operating room and positioned supine on the surgical bed. Sequential compression devices were applied to bilateral lower extremities. Prior to induction of anesthesia, OR brief was performed confirming the correct patient, patient information, procedure and equipment. Once anesthesia was induced, the patient was placed in a lithotomy position, prepped and draped in the usual sterile fashion, and antibiotics were administered.    After a time-out was performed, the 21-French rigid cystoscope was passed through the urethra into the bladder.     The left sided ureteral stent was identified and brought to the meatus with rigid graspers. A straight sensor wire was then fed through the ureteral stent and advanced to the level of the kidney, verified with fluoroscopy. The stent was then removed.    We then used a dual lumen  catheter to introduce a second wire which was confirmed to be in the renal pelvis by fluoroscopy. This was secured as the safety wire.    We then introduced a 11x13x45 ureteral access sheath over a wire. Once this was confirmed to reach the renal pelvis we removed the inner sheath and wire. We introduced the flexible ureteroscope and performed a systematic survey of the left kidney which revealed a 2-3cm papillary tumor in the renal pelvis. Using a combination of basket and laser we were able to visually clear the pelvis of viable tumor. We obtained several tissue specimens for pathologic analysis.    We then placed a 6x26 cm ureteral stent without strings strings.  This was advanced up to the renal pelvis without difficulty and correct location of the proximal curl was confirmed under fluoroscopy. The distal curl was then confirmed in the bladder. We then emptied the bladder and instilled 60cc gemcitabine for 1 hour with plans to remove in PACU.     The attending surgeon, Dr. Sallyanne Kuster, was present for the key portions of the case.    SPECIMENS:   Left renal tumor    ESTIMATED BLOOD LOSS: 5cc    URINE OUTPUT: None    COMPLICATIONS: None    DISPOSITION: The patient was successfully extubated and taken to the post-anesthesia care unit in stable condition accompanied by a member of the surgical team    POST-OPERATIVE PLAN:  F.u with Dr. Sallyanne Kuster to review pathology  Maintain ureteral stent

## 2021-06-21 ENCOUNTER — Other Ambulatory Visit: Payer: Self-pay

## 2021-06-23 ENCOUNTER — Encounter (HOSPITAL_COMMUNITY): Payer: Self-pay | Admitting: Urology

## 2021-06-25 ENCOUNTER — Ambulatory Visit: Payer: Medicare Other | Attending: Urology | Admitting: Urology

## 2021-06-25 ENCOUNTER — Encounter (HOSPITAL_BASED_OUTPATIENT_CLINIC_OR_DEPARTMENT_OTHER): Payer: Self-pay | Admitting: Urology

## 2021-06-25 VITALS — BP 121/65 | HR 63 | Temp 97.2°F | Resp 17 | Ht 66.0 in | Wt 205.6 lb

## 2021-06-25 DIAGNOSIS — C679 Malignant neoplasm of bladder, unspecified: Secondary | ICD-10-CM | POA: Insufficient documentation

## 2021-06-25 DIAGNOSIS — C662 Malignant neoplasm of left ureter: Secondary | ICD-10-CM | POA: Insufficient documentation

## 2021-06-25 LAB — URINALYSIS
Bilirubin: NEGATIVE
Glucose: NEGATIVE
Ketones: NEGATIVE
Leuk Esterase: 500 Leu/uL — AB
Nitrite: NEGATIVE
RBC: >50 — AB (ref 0–?)
Specific Gravity: 1.018 (ref 1.002–1.030)
Urobilinogen: NEGATIVE
pH: 6 (ref 5.0–8.0)

## 2021-06-25 NOTE — Interdisciplinary (Signed)
Patient signed [facility] Eligibility form and [place] Release of Information.  Patient is aware that eligibility form and release of information will be sent to [employer] for eligibility determination for the Patient Assistance Program.  Provided patient resource site for [facility].  Consent for procedure signed.  Patient aware and verbalized understanding that upon beginning treatment with Jel[facility]to he will be receiving treatment for 6 consecutive weeks initially and that schedule for maintenance thereafter will be discussed closer to completion of initial treatment.  He also understands that this procedure is an outpatient procedure and will be released same day if procedure is uneventful.

## 2021-06-25 NOTE — Patient Instructions (Signed)
PLAN:   -For more information on Jelmyto, visit: [email or web address]    Morene Antu will contact you to schedule procedure  Please complete pretreatment lab work 4 business days before procedure  Morene Antu will let you know if COVID test is required prior to surgery   If you are taking blood thinners, please obtain clearance from the prescribing physician to hold them prior to surgery   PLEASE HOLD ALL NSAIDS (non-steroidal anti-inflammatory drugs) SUCH AS advil, aleve, motrin, ibuprofen, relafen, lodine, feldene, Diclofenac, voltaren, indomethacin, naproxen, celebrex, Mobic 7 days before surgery.   Please hold vitamins, supplements, herbs & fish oil 7 days before surgery.  See below for a more extensive list of medications that may thin your blood.    -For more information on Urology, please visit: [email or web address]      Carlota Raspberry MD  Department of Urology     [facility] - Urology  Monday-Friday 8:00- 5:00p.m. (Closed Holidays and Weekends)     For any question or to schedule any appointment call [phone]    AFTER HOURS EMERGENCY NUMBER [phone]  Ask for the Urologist/Oncologist Physician On-Call.        [facility] - Urology  Fax number: [phone]     Clinical Nurse  Vicente Males  Phone: [phone]  Fax: [phone]    Administrative Assistant/Surgery [place]   Phone [phone]  Fax [phone]    [place]   Phone [phone] Option 2  Chemotherapy appointments  [place] Monday - Friday 7:30AM - 9:30PM  Holidays/Weekends 8:00AM -- 6:00PM     Radiation Oncology   Phone [phone]    Radiation appointments     MRI    Phone [phone] or [phone]  MRI appointments     Radiology Scheduling   Phone [phone]   [name], CT Scans, or studies.       Social Worker  Agnes Lawrence, [place]  Phone [phone]      [employer]  [phone]     Ostomy nurse  "STAR" Carmie End, Shelly Bombard, BSN, [facility]  Phone [phone]  Fax [phone]  Clinic days: Tuesday/Thursday  Office days: Wednesday/Friday

## 2021-06-25 NOTE — Progress Notes (Signed)
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  CT urography on 04/22/2021 confirmed a filling defect in the anterior left renal pelvis.  Chest CT has been ordered but not performed yet.  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  He tolerated the procedure well.      Repeat ureteroscopy on June 16, 2021 confirmed primary low-grade disease.  The patient is feeling well on the whole and returning to normal activities.  They deny any significant pain, problems with eating, or difficulty with bowel movements.        Review of Systems: No TIA's or unusual headaches, no dysphagia.  No prolonged cough. No dyspnea or chest pain on exertion.  No abdominal pain, change in bowel habits, black or bloody stools.  No urinary tract or BPH symptoms.  No new or unusual musculoskeletal symptoms.    Physical Exam:  There were no vitals filed for this visit.   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    -we discussed the pathology and how the most definitive option from an oncologic perspective would be radical nephro ureterectomy with lymphadenectomy bladder cuff excision with or without neoadjuvant chemotherapy.  This would leave the patient anephric and  dialysis dependent until transplant could be considered.    Alternatively, endoscopic management could be considered though this is not a standard option for high-grade urothelial carcinoma. Postoperative intracavitary chemotherapy will be instilled.    This may be preferable from a quality of life perspective, but again is not the most definitive oncologic option.    I will order tumor sequencing in the event that he has microsatellite instability or FGFR3 mutations that may make him more susceptible to systemic therapy, targeted therapy or immunotherapy.  Finally upfront systemic therapy and endoscopic management would be another consideration.

## 2021-06-28 ENCOUNTER — Encounter (HOSPITAL_BASED_OUTPATIENT_CLINIC_OR_DEPARTMENT_OTHER): Payer: Self-pay | Admitting: Anesthesiology

## 2021-06-28 ENCOUNTER — Ambulatory Visit: Payer: Medicare Other | Attending: Anesthesiology | Admitting: Anesthesiology

## 2021-06-28 VITALS — Temp 97.5°F | Resp 16

## 2021-06-28 DIAGNOSIS — M539 Dorsopathy, unspecified: Secondary | ICD-10-CM | POA: Insufficient documentation

## 2021-06-28 LAB — URINE CULTURE: Urine Culture Result: <10000 — AB

## 2021-06-28 MED ORDER — NITROFURANTOIN MONOHYD MACRO 100 MG OR CAPS
100.0000 mg | ORAL_CAPSULE | Freq: Two times a day (BID) | ORAL | 0 refills | Status: DC
Start: 2021-06-28 — End: 2021-08-05

## 2021-06-28 NOTE — Progress Notes (Signed)
PAIN CLINIC FOLLOW UP NOTE    Primary Care Physician [name]    Chief Complaint: Low Back Pain (Chronic; radiating to legs)      SUBJECTIVE:  This is a 70 year old male with a chief complaint of low back pain.  The patient is here for a follow up of back pain    S/p ALIF L5-S1 05/05/2021  Referred to pain clinic for consideration of injections    Location: 1-2 months, lower back pain. No radiating. Around L4-S1. When he moves around, he has LBP  The surgery in May 2022 was LBP in his lower left buttock radiating down his left leg. Reports that current pain is different. He does not think he had any triggers for this current pain. He denies radiation of pain to legs but does note he has had burning pain in his legs and was told he might have lymphedema.    The patient has seen a physical therapist to treat the current problem.   Over the last 12 months, they have done over 10 sessions and over the last 6 months they have done over 10 sessions.   PT helped partially but had difficulty continuing PT due to changes in his PT provider  The patient is doing a home exercise program: walks around home, does stationary bicycle (though recently decreased because of the pain and not to exacerbate in setting of LBP)    Pain Treatments  Previous pain procedures and response include:   - 05/05/21: ALIF L5-S1  - 08/22/20: Laminectomy L4, partial laminectomies L5 and L3 with bilateral  facetectomies and foraminotomies L3-L4 and L4-L5. Posterior segmental spinal instrumentation and fusion L3 through L5   -05/31/19 left C4-C 6 diagnostic MBB (80% relief)  -RFA LeftL3-L512/9/19(did not provide much relief)  -Left L3-5 MBB 06/26/2018  -Cervical ESI 01/23/2018 (did not provide relief)  -Lumbar ESI 11/24/2018 (did not provide relief)    Patient is currently using the following pain medications:   None    Current Pain Description  On the pain diagram today the patient shades in the areas of their lower back.    Since the last visit in Pain  Clinic patient reports N/A improvement in their pain from pain procedures.  Patient reports N/A improvement in their pain with use of their current pain medications. They describe their pain as pressure, nagging and squeezing. Patient states their pain is associated with weakness and tingling. This pain has made it hard for the patient to walk, sleep, work and exercise.    The patient stated their pain today is Pain Score: 8/10.   Over the past week the patient's pain has been at its worst 9/10, at best 3/10 and averages 7/10.   During the past week, it has interfered with enjoyment of life 8/10 and general activity 8/10.    OBJECTIVE:  Physical Exam  Vitals: Temp 97.5 F (36.4 C)    Resp 16   General: healthy  Respiratory:  Breathing easily without tachypnea or bradypnea. Not using accessory muscles.  GI/Abdomen: non-distended  Ambulation: Pt is able to raise from a seated position without difficulty. the patient ambulates without assistance.   Musculoskeletal:    Back: tender to palpation L3-S1 and lateral sides of back muscles. Negative straight leg test. No SI joint TTP    Imaging:  L-spine XR 05/27/21-  No acute osseous abnormality.  Status post revision lumbar spinal fusion without hardware complication/failure.  Unchanged anterolisthesis of L5 on S1.  ASSESSMENT  Encounter Diagnoses   Name Primary?    Dorsopathy Yes     This is a 70 year old male here with low back pain, likely MSK origin given TTP on exam across L spine and lumbar muscles     PLAN  Medication Plan: No changes    Procedure Plan: Referral for Trigger point injections    Follow-up: After above procedure    Medical Decision Making  Today reviewed notes from prior visit(s) with [place] [facility] for Pain Medicine    Risks applicable to today's encounter and procedural risks relevant to the patient include:   decision regarding procedure(s) with identified patient or procedure risk factors with identified patient or procedure risk factors  risk of  worsening of pain in setting of chronic pain disorder with possible central sensitization    Total duration of encounter spent in pre-visit (reviewing last visit, reviewing prior Epic notes and reviewing images), intra-visit (creating a treatment plan and medical discussion with patient), and post-visit (note completion and placing of orders) on the day of the encounter, excluding separately reportable services/procedures: 30 minutes.    Seen and discussed with Dr. Glendell Docker, MD  Internal Medicine PGY-3  Pager [phone]

## 2021-06-28 NOTE — Progress Notes (Signed)
Attending Note:     Subjective:   I reviewed the history.   Patient interviewed and examined.   This is a 70 year old male who has a past medical history of Chronic back pain, Congenital hydronephrosis, Gout, Headache, Hematuria, Kidney disease, Kidney stones, Major depressive disorder, single episode, Polyarthropathy or polyarthritis of multiple sites, Retinal detachment, and Urethral stricture. he is here today for Low Back Pain (Chronic; radiating to legs)    This is a follow-up.    [facility] Patient Questionnaire  Main reason for today's visit: Severe lower back pain base of spine  Any other concerns: ongoing medical issue  Have you had a pain procedure since your last visit? No  Since your LAST visit to the pain clinic until NOW, how much relief have pain medications provided? 0% (no relief)  Over the last 12 months, how many sessions of physical therapy have you done? 1-7  Of those sessions of physical therapy, how many were over the last 6 months? 1-7  If you had physical therapy, did it help? Didn't help  Do you do home exercises? Yes  What does the pain feel like? Pressure;Aching;Radiating;Squeezing;Sharp;Pinching;Heavy  Do you have? Weakness;Muscle Spasms;Tightness  Does the pain make it hard for you to: Walk;Sit;Exercise  Rate your pain at its worst in the past week: 7  Rate your pain at its least in the past week: 7  Rate your pain on average in the past week: 7  What number best describes how, during the past week, pain has interfered with your enjoyment of life: 7  What number best describes how, during the past week, pain has interfered with your general activity: 7  PEG Score: 7  Have you had any new studies done to evaluate your pain since your last visit? Yes  Please list: X-Ray    Objective:   I have examined the patient and I concur with the fellow physician/resident physician/medical student exam as documented.   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 Moderate left ureteropelvic junctional and mild right anterior bladder wall urothelial thickening and irregularity, which are indeterminate, but may also represent additional sites of urothelial disease. Moderate left hydronephrosis. Status post right nephrectomy. No evidence of metastatic disease in the abdomen or pelvis outside the urothelial tract.     CT Chest With Contrast    Result Date: 05/20/2021  IMPRESSION: No evidence of metastatic disease in the thorax    X-Ray Thoracolumbar Spine 2 Views    Result Date: 10/05/2020  IMPRESSION: No acute fracture or dislocation. Redemonstration of posterior instrumented fixation and interbody fusions from L3-S1 without interval hardware complication/failure. Spinal alignment is not significantly changed. Other chronic findings, including mild multilevel degenerative disc disease above the level of instrumentation, not significantly changed.    X-Ray Lumbosacral Spine 2 Or 3 Views    Result Date: 05/27/2021  IMPRESSION: No acute osseous abnormality. Status post revision lumbar spinal fusion without hardware complication/failure. Unchanged anterolisthesis of L5 on S1.     X-Ray Lumbosacral Spine 2 Or 3 Views    Result Date: 02/02/2021  IMPRESSION: Redemonstration of revision surgery, consisting of interbody fusion at L5-S1 and extension of posterior hardware down to S1. As compared to postoperative radiographs dating back to 08/20/2020, there is gradual anterolisthesis of L5 on S1, now measuring approximately 9 mm. This may in  part be related to differences in projection. Hardware are grossly intact, including more remote posterior instrumented fixation and interbody fusions from L3-L5. Redemonstration of grade 1 retrolisthesis of L2 on L3. 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 Bones are demineralized.  Vascular  calcifications are present.    X-Ray Lumbosacral Spine 2 Or 3 Views    Result Date: 11/24/2020  IMPRESSION: No acute osseous abnormality of the lumbar spine. No complications at combined interbody and posterior instrumented fusion from L3 through S1.     X-Ray Lumbosacral Spine 2 Or 3 Views    Result Date: 09/21/2020  IMPRESSION: No change from the prior radiograph on 09/10/2020. No acute osseous abnormality. Redemonstration of posterior instrumented fixation and interbody fusions of L3-S1 without interval hardware complication or failure.     X-Ray Lumbosacral Spine 2 Or 3 Views    Result Date: 09/10/2020  IMPRESSION: Redemonstration of posterior instrumented fixation and interbody fusions from L3-S1 without interval hardware complication/failure. Lumbar alignment is not significantly changed. Multilevel degenerative disc disease above the level of instrumentation, not significantly changed.    X-Ray Lumbosacral Spine 2 Or 3 Views    Result Date: 08/20/2020  IMPRESSION: Good alignment status post additional anterior and posterior instrumented spinal fusion down to S1 with new morselized bone graft.     Assessment and plan reviewed with the fellow physician/resident physician/medical student.   I agree with the fellow physician/resident physician/medical student as documented.     Patient last seen 04/22/2019.  He has since had a fusion from L3-S1.  This has relieved his leg pain but now has a new lumbar pain.  Exam is consistent with myofascial.  Discussed trigger point injections.    Encounter Diagnoses   Name Primary?    Dorsopathy Yes       Harl was seen today for low back pain.    Diagnoses and all orders for this visit:    Dorsopathy  -     Case Request: Trigger point injection low back muscles; Standing  -     bupivacaine 0.25 % PF injection 10 mL    Other orders  -     Vital signs; Standing  -     Nursing Misc Order: Verify Consent for Procedure; Standing  -     Glucose (POC); Standing  -     Prothrombin Time,  Blood Blue; Standing  -     INR & Protime (POCT); Standing  -     Oxygen Non-Protocol; Standing  -     Discharge Patient - Review & Print AVS; Standing        Patient Instructions     What is a Trigger Point?  These are tight, dysfunctional, and painful bands of muscle that are often located in the neck, upper back, and lower back.     These trigger points are usually very tender to applied pressure, and can often radiates pain outwards when pressed. Over time, this muscular tension can reduce blood flow to the painful regions and worsen the pain cycle.     This condition is often referred to as myofascial pain syndrome by healthcare providers.     What is a Doctor, hospital?  A trigger point injection involves the use of a small needle to administer local anesthetic medication directly into painful areas within the muscles. This helps to relax the muscles and break the cycle of dysfunction. Your physician may also use a technique known as dry needling to help  break up the tight tissue and stimulate increased blood flow to the area.      In certain cases, your physician may choose to inject Botox (botulinum toxin) into the trigger points. This medication reduces muscle contraction at a molecular level, and can also be very effective in treating myofascial pain.     How Are Trigger Point Injections Performed?  You will be asked to sit or lie in a position in which the affected are be accessible to your physician.     Cleaning solution is applied to site of the pain. Your physician will then use a small needle to inject some local anesthetic medication into each of the painful areas of the affected muscles.     For trigger point injections of certain structures, such as the piriformis muscle, image guidance with either ultrasound or fluoroscopy (X-ray) will be utilized to ensure accurate placement of the needle.     Risks and Complications:  Trigger point injections are considered very safe in general.      However, as with any minor medical procedure, there are potential risks. This includes soreness, bleeding, bruising, or infection. If the procedure is done in the upper back or chest, then pneumothorax (collapsed lung) is also a potential rare complication.     We will take every measure to minimize these potential risks and maximize the therapeutic benefit.       I have examined the patient, discussed the findings, reviewed the plan, and answered all questions with the patient.  See the fellow physician/resident physician/medical student note for further details.    Medical Decision Making  Today reviewed notes from prior visit(s) with [facility], reviewed MRI L-spine report, reviewed XR L-spine report and independently reviewed XR L-spine    Risks applicable to today's encounter and procedural risks relevant to the patient include:   decision regarding procedure(s) with identified patient or procedure risk factors with identified patient or procedure risk factors  risk of worsening of pain in setting of chronic pain disorder with possible central sensitization    Total duration of encounter spent in pre-visit (reviewing last visit, reviewing prior Epic notes and reviewing images), intra-visit (performing physical exam, creating a treatment plan and medical discussion with patient), and post-visit (note completion and placing of orders) on the day of the encounter, excluding separately reportable services/procedures: 30 minutes.

## 2021-06-28 NOTE — Patient Instructions (Addendum)
What is a Trigger Point?  These are tight, dysfunctional, and painful bands of muscle that are often located in the neck, upper back, and lower back.     These trigger points are usually very tender to applied pressure, and can often radiates pain outwards when pressed. Over time, this muscular tension can reduce blood flow to the painful regions and worsen the pain cycle.     This condition is often referred to as myofascial pain syndrome by healthcare providers.     What is a Trigger Point Injection?  A trigger point injection involves the use of a small needle to administer local anesthetic medication directly into painful areas within the muscles. This helps to relax the muscles and break the cycle of dysfunction. Your physician may also use a technique known as dry needling to help break up the tight tissue and stimulate increased blood flow to the area.      In certain cases, your physician may choose to inject Botox (botulinum toxin) into the trigger points. This medication reduces muscle contraction at a molecular level, and can also be very effective in treating myofascial pain.     How Are Trigger Point Injections Performed?  You will be asked to sit or lie in a position in which the affected are be accessible to your physician.     Cleaning solution is applied to site of the pain. Your physician will then use a small needle to inject some local anesthetic medication into each of the painful areas of the affected muscles.     For trigger point injections of certain structures, such as the piriformis muscle, image guidance with either ultrasound or fluoroscopy (X-ray) will be utilized to ensure accurate placement of the needle.     Risks and Complications:  Trigger point injections are considered very safe in general.     However, as with any minor medical procedure, there are potential risks. This includes soreness, bleeding, bruising, or infection. If the procedure is done in the upper back or chest,  then pneumothorax (collapsed lung) is also a potential rare complication.     We will take every measure to minimize these potential risks and maximize the therapeutic benefit.    For procedure orders that are for first available procedures, please allow up to 10 business days for your procedure order to be submitted to your insurance and you will be contacted to schedule your procedure once it has been approved. If you have not been contacted in 10 business days, please give the clinic a call for an update on your authorization.     For procedures that are ordered for the future upcoming months, the order will be submitted to your insurance one month prior to the procedure due date.     Please call [phone] to check on the status of your authorization   Please call [phone] to schedule your procedure if you have not been contacted once your procedure is approved     Follow up Appointment Please call to make a follow up appointment after your procedure or series of procedures for documentation purposed to submit to your insurance for future procedure approvals.

## 2021-06-29 ENCOUNTER — Encounter (HOSPITAL_BASED_OUTPATIENT_CLINIC_OR_DEPARTMENT_OTHER): Payer: Self-pay | Admitting: Urology

## 2021-06-29 ENCOUNTER — Other Ambulatory Visit (HOSPITAL_BASED_OUTPATIENT_CLINIC_OR_DEPARTMENT_OTHER): Payer: Self-pay | Admitting: Nurse Practitioner

## 2021-06-29 ENCOUNTER — Telehealth (HOSPITAL_BASED_OUTPATIENT_CLINIC_OR_DEPARTMENT_OTHER): Payer: Self-pay | Admitting: Urology

## 2021-06-29 DIAGNOSIS — Z981 Arthrodesis status: Secondary | ICD-10-CM

## 2021-06-29 NOTE — Telephone Encounter (Addendum)
Per Dr. Sallyanne Kuster, urine culture noted with bacteria.  Antibiotic has been sent to pharmacy on file.    Left message to call back.  Please inform patient of above upon call return.

## 2021-06-30 ENCOUNTER — Telehealth (HOSPITAL_BASED_OUTPATIENT_CLINIC_OR_DEPARTMENT_OTHER): Payer: Self-pay | Admitting: Urology

## 2021-06-30 MED ORDER — PREGABALIN 100 MG OR CAPS
ORAL_CAPSULE | ORAL | 5 refills | Status: DC
Start: 2021-06-30 — End: 2021-09-14

## 2021-06-30 NOTE — Telephone Encounter (Signed)
DOS 07/2020 L/S fusion   LOV: 05/24/21  Gwenyth Ober, NP      Refill sent to provider.

## 2021-06-30 NOTE — Telephone Encounter (Signed)
SURGERY: CYSTOSCOPY, WITH RETROGRADE PYELOGRAM    Surgeon: Dr. Sallyanne Kuster    Left msg for pt - advised I have orders to schedule procedure starting mid August. I am still working on confirming dates and will call Stephen [name] to review once confirmed.

## 2021-07-01 ENCOUNTER — Other Ambulatory Visit: Payer: Self-pay

## 2021-07-01 ENCOUNTER — Encounter (HOSPITAL_BASED_OUTPATIENT_CLINIC_OR_DEPARTMENT_OTHER): Payer: Self-pay | Admitting: Hospital

## 2021-07-01 NOTE — Telephone Encounter (Signed)
Spoke with patient - reviewed scheduling process with him - enrollment form to be completed, find OR time,  will need to provide urine sample 4-5 days prior to each procedure, and procedure is outpatient so will need a driver.    Advised patient I will call him once I can confirm details for surgery.    Pt verbalized understanding.

## 2021-07-07 ENCOUNTER — Telehealth (HOSPITAL_BASED_OUTPATIENT_CLINIC_OR_DEPARTMENT_OTHER): Payer: Self-pay | Admitting: Anesthesiology

## 2021-07-07 NOTE — Telephone Encounter (Signed)
Patient called needing to CX and reschedule procedure appt. July 21, 22 due to being exposed to COVID-19. Please contact patient to reschedule.

## 2021-07-08 DIAGNOSIS — M539 Dorsopathy, unspecified: Secondary | ICD-10-CM | POA: Insufficient documentation

## 2021-07-12 NOTE — Telephone Encounter (Signed)
SURGERY: CYSTOSCOPY, WITH RETROGRADE PYELOGRAM WITH [place] INSTILLATION    Surgeon: Dr. Baron Hamper with pt - request call back tomorrow at 10 AM, but asked to review dates. Provides surgery dates below.    SURGERY PLAN  *Schedule 1 dose/wk for 6 weeks at 10 AM or 11 AM only at [facility] - 08/18   CHECK IN - 8:00 AM    DATE -  08/25  CHECK IN - 8:00 AM    DATE - 09/01  CHECK IN - 8:00 AM    DATE - 09/08  CHECK IN - 8:00 AM    DATE -  09/15  CHECK IN - 8:00 AM    DATE -  09/22  CHECK IN - 8:00 AM    APC - 08/10  COVID-19 test - not needed  POV - TBD

## 2021-07-13 NOTE — Telephone Encounter (Signed)
Left msg for pt - apologized I wasn't able to call at 10 AM. Request call back at [phone] to advise when he would be available to connect,

## 2021-07-13 NOTE — Telephone Encounter (Signed)
SURGERY: CYSTOSCOPY, WITH RETROGRADE PYELOGRAM WITH [place] INSTILLATION    Surgeon: Dr. Sallyanne Kuster    **TO BE SCHEDULED AT [facility]**    Enrollment form  - Enrollment for emailed to [facility] on 7/26    Spoke with patient - scheduled surgery at [facility] and provided plan below.    Reviewed pre op instructions with patient  - NPO after midnight  - Complete urine culture 4-5 days prior to each procedure    Reviewed Transportation with family/friend after discharge   - Please arrange for transportation home after your surgery and hospital stay. A taxi, shuttle bus, uber or lyft is NOT acceptable! You will need to be accompanied home by a responsible adult.  - You must have a friend or relative accompany you on the day of surgery.  This person will be asked to review discharge instructions with the nurse and to take you home. If you do not bring someone, your surgery may be cancelled.   - You CANNOT drive yourself home or use public transportation. You CANNOT use a ride-share service (Uber/Lyft) or a taxi unless you have your friend or relative accompany you    Reviewed [facility] visitor policy - provide proof of vaccination or negative COVID test within 72 hours of the visit    Pt aware time may change to the afternoon    Pt verbalized understanding    SURGERY PLAN  *Schedule 1 dose/wk for 6 weeks at 10 AM or 11 AM only at [facility] - 08/18   CHECK IN - 8:00 AM    DATE -  08/25  CHECK IN - 8:00 AM    DATE - 09/01  CHECK IN - 8:00 AM    DATE - 09/08  CHECK IN - 8:00 AM    DATE -  09/15  CHECK IN - 8:00 AM    DATE -  09/22  CHECK IN - 8:00 AM    APC - 08/10  COVID-19 test - not needed  POV - TBD  [Patient aware surgery time is subject to change]    QUESTIONS  Sleep Apnea -no  Pacemaker/Cardiac Issues -no  Catheter/Port/NT -no  Blood Thinners -no  Smoking? - no   [Patient aware to discontinue any blood thinning medications/vitamins 7 days prior to surgery]    Hospitalized w/in 3 months -no  Able to walk to  walk up 2 flight of stairs -yes  Difficulty w/ anesthesia or airway -no    ENROLLMENT FORM COMPLETED - Yes  [should be completed and submitted prior to scheduling procedure]    Urine Culture - to be completed w/in 4-5 days prior to each procedure - pt aware to complete on sundays at [facility] prior to all scheduled procedure.    Is Medical Clearance needed - not needed    Surgery Letter - to be sent via MyChart      Surgical Consent - media     Auth:  Will request as time gets closer per auth teams request.       *NOTES IF ANY

## 2021-07-13 NOTE — Telephone Encounter (Signed)
Enrollment form emailed to [employer] at [email or web address]

## 2021-07-14 ENCOUNTER — Telehealth (HOSPITAL_BASED_OUTPATIENT_CLINIC_OR_DEPARTMENT_OTHER): Payer: Self-pay | Admitting: Nurse Practitioner

## 2021-07-14 DIAGNOSIS — Z981 Arthrodesis status: Secondary | ICD-10-CM

## 2021-07-14 MED ORDER — PREGABALIN 75 MG OR CAPS
75.0000 mg | ORAL_CAPSULE | Freq: Every morning | ORAL | 0 refills | Status: DC
Start: 2021-07-14 — End: 2021-08-10

## 2021-07-14 NOTE — Telephone Encounter (Addendum)
Pt informed of refill via VM.   I have attempted to contact this patient by phone with the following results: called patient, no answer, left voicemail to return call to clinic, phone number included..  Mychart message also sent to pt with Orthopedics Call back number. Call Center [phone]     CALL CENTER:  When patient calls back: Let him know his prescription    For Lyrica 75 mg was sent to Pharmacy and ready for pickup.

## 2021-07-14 NOTE — Telephone Encounter (Signed)
Sent to pharmacy 

## 2021-07-14 NOTE — Telephone Encounter (Signed)
Patient is requesting 75 MG Capsule    Medication: pregabalin (LYRICA) 75 MG capsule (Order# [id number])    Date of last refill: 06/30/21      How many Pills/Tablets does the patient have left? 1      Name of original prescribing physician: Renato Gails NP      Preferred clinic pick-up location or desires medication to be sent electronically to pharmacy if possible?    Pharmacy  [id number] [place] [place]  [address] [id number]  Phone:  [phone]:  [phone]  DEA #:  [id number]      What order is the Preferred Pharmacy for this prescription in registration (1,2,3) ?    1        Best callback phone number and best time:    [phone]      Department Policy: We are only able to prescribe medications for patients that are current established patients (seen within 6 months, unless their notes state return in a year).       Please allow up to up to 72 hours for our office to process your medications request    Please verify any new medication allergies with patient, Please review chart to see notes from last refill request if patient has obtained previous refills of same medication.

## 2021-07-14 NOTE — Telephone Encounter (Signed)
Received fax re: missing dx info on [facility] form.    Covering AA called [employer] support at [phone].    Spoke with [employer]. 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     Anderson Malta states that since this is "off label diagnosis" she will need to check with her supervisor. Was placed on hold.    Per Supervisor, will need to provide diagnosis from what is  "on label" (any of the 6 boxes).    Routing to [employer] for review and notification. Please modify the form and add the correct diagnosis on the box and can then re-send.     [employer] contact info below  Phone: [phone]  Hours of operation: M-F 8am - 8pm ET    Adding Morene Antu for notification.

## 2021-07-14 NOTE — Telephone Encounter (Signed)
Medication Refill Request      Rx:  pregabalin (LYRICA)  75 MG capsule   Last Refill Date (LRD):  12/30/20   DOS:n/a   LOV: 05/24/21   NOV:   Future Appointments   Date Time Provider [place]   07/28/2021 10:30 AM NURSE D [place] ANES [facility]    Per [facility] 05/24/21 discharge instructions:       [facility] pt has refill for Lyriac 100 mg, but no refill left for the '75mg'$  ,

## 2021-07-15 ENCOUNTER — Other Ambulatory Visit: Payer: Self-pay

## 2021-07-15 NOTE — Telephone Encounter (Signed)
Artis Delay, LVN  You; [place], [place] Mentes; Artis Delay, LVN Yesterday (11:14 AM)     Will discuss with Dr. Sallyanne Kuster once he is back in clinic on 07/20/21.    Routing comment

## 2021-07-19 NOTE — Telephone Encounter (Signed)
Pt asking if he can complete urine culture on Saturday instead

## 2021-07-20 ENCOUNTER — Encounter (HOSPITAL_BASED_OUTPATIENT_CLINIC_OR_DEPARTMENT_OTHER): Payer: Self-pay | Admitting: Hospital

## 2021-07-21 NOTE — Telephone Encounter (Signed)
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

## 2021-07-22 ENCOUNTER — Other Ambulatory Visit: Payer: Self-pay

## 2021-07-22 ENCOUNTER — Ambulatory Visit
Admission: RE | Admit: 2021-07-22 | Discharge: 2021-07-22 | Disposition: A | Discharge status: Home / Self Care | Payer: Medicare Other | Attending: Anesthesiology | Admitting: Anesthesiology

## 2021-07-22 ENCOUNTER — Encounter (HOSPITAL_BASED_OUTPATIENT_CLINIC_OR_DEPARTMENT_OTHER): Admission: RE | Disposition: A | Discharge status: Home Health | Payer: Self-pay | Attending: Anesthesiology

## 2021-07-22 DIAGNOSIS — M549 Dorsalgia, unspecified: Secondary | ICD-10-CM

## 2021-07-22 DIAGNOSIS — M539 Dorsopathy, unspecified: Secondary | ICD-10-CM | POA: Insufficient documentation

## 2021-07-22 DIAGNOSIS — M5387 Other specified dorsopathies, lumbosacral region: Secondary | ICD-10-CM

## 2021-07-22 DIAGNOSIS — Z87442 Personal history of urinary calculi: Secondary | ICD-10-CM | POA: Insufficient documentation

## 2021-07-22 DIAGNOSIS — Z882 Allergy status to sulfonamides status: Secondary | ICD-10-CM | POA: Insufficient documentation

## 2021-07-22 DIAGNOSIS — Z880 Allergy status to penicillin: Secondary | ICD-10-CM | POA: Insufficient documentation

## 2021-07-22 SURGERY — PAIN TPI (2 OR MORE MUSCLES)
Anesthesia: Local | Laterality: Bilateral

## 2021-07-22 MED ORDER — BUPIVACAINE HCL (PF) 0.25 % IJ SOLN
INTRAMUSCULAR | Status: DC | PRN
Start: 2021-07-22 — End: 2021-07-22
  Administered 2021-07-22: 9 mL

## 2021-07-22 MED ORDER — BUPIVACAINE HCL (PF) 0.25 % IJ SOLN
INTRAMUSCULAR | Status: AC
Start: 2021-07-22 — End: ?
  Filled 2021-07-22: qty 10

## 2021-07-22 SURGICAL SUPPLY — 16 items
APPLICATOR CHLORAPREP 3ML, CLEAR (Prep Solutions) ×2 IMPLANT
COVER ULTRASOUND PROBE W/ GEL (Drapes/towels) IMPLANT
MARKER SECURELINE SURG SKIN (Misc Medical Supply) ×2 IMPLANT
NEEDLE BD HYPO 27G X 1.25" (Needles/punch/cannula/biopsy) IMPLANT
NEEDLE BD HYPO 30G X 1" (Needles/punch/cannula/biopsy) ×2
NEEDLE ECHOBLOCK MSK 21G X 3 1/8" (Needles/punch/cannula/biopsy)
NEEDLE ECHOBLOCK MSK 21G X 4" (Needles/punch/cannula/biopsy)
NEEDLE ECHOBLOCK MSK 22G X 2" (Needles/punch/cannula/biopsy) IMPLANT
NEEDLE PROTECT 18G X 1.5" (Needles/punch/cannula/biopsy) ×2 IMPLANT
NEEDLE PROTECT 25G X 1.5" (Needles/punch/cannula/biopsy) IMPLANT
NEEDLE SPINE QUINCKE 23G X 3.5" (Needles/punch/cannula/biopsy) IMPLANT
NEEDLE SPINE QUINCKE 23G X 5" (Needles/punch/cannula/biopsy)
NEEDLE SPINE QUINCKE 25G X 3.5" (Needles/punch/cannula/biopsy)
NEEDLE SPINE QUINCKE 25G X 6" (Needles/punch/cannula/biopsy) IMPLANT
TOWELS OR BLUE 4-PACK STERILE, DISPOSABLE (Drapes/towels)
TRAY SINGLE SHOT EPIDURAL (Procedure Packs/kits) ×2

## 2021-07-22 NOTE — H&P (Signed)
Ambulatory Surgery/Invasive Procedure History and Physical      Primary Care Physician [name]    Chief Complaint:  low back pain     70 year old male     There were no vitals taken for this visit.    Past Medical History:   Diagnosis Date    Chronic back pain     Congenital hydronephrosis     Gout     Headache     Hematuria     Kidney disease     Kidney stones     Major depressive disorder, single episode     Polyarthropathy or polyarthritis of multiple sites     Retinal detachment     Urethral stricture        Allergies   Allergen Reactions    Amoxicillin Rash    Sulfa Drugs Unspecified       No current facility-administered medications for this encounter.       I have reviewed the past medical history, allergies and current medications as documented in the electronic health record.      Physical Exam       Can this patient make their own healthcare decisions?  Yes    Chest:  Breaths easily     Heart:  RRR    Abdomen:  Soft    Pain Management Needs/Options discussed.    No Advanced Directives.      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    Discussed Risks, Benefits, and Alternatives to procedure.  Questions answered.  Patient voiced understanding and wished to proceed. Consent Signed.    Risk include:    Minor adverse effects: Superficial infection, bleeding, bruising, headache, worsening of pain, lack of benefit, flushing or redness of the face and neck, elevated blood pressure, increased blood sugar, difficulty sleeping, changes in mood, nausea, or vomiting. A transient sensation of tingling or shooting during the injection.    Rare but serious adverse effects: Allergic reaction, internal bleeding, infection in deeper tissue such as the discs or spine requiring prolonged or  intravenous antibiotics or surgery, punctured lungs, changes in blood pressure, pulse and respirations, cardiac arrest, permanent injury to nerves or spinal cord, paralysis, stroke and even death.     See procedure note of same date.

## 2021-07-22 NOTE — Op Note (Signed)
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 This is the first such procedure performed for this patient.    Procedure in detail:  Written informed consent was obtained.  The chart was reviewed, questions were answered and the patient wished to proceed. The patient had no contraindications to the procedure.  Vital signs were stable. Standard monitoring was applied.  The patient and the physician assistant confirmed the site of injection after an official "time out."     Localization Time Out:  An additional intraoperative timeout, specifically to confirm accurate localization, was conducted by the physician assistant.     The patient remained awake and alert throughout the procedure.  The patient was placed in the sitting position.  The skin was prepped with chlorhexidine and sterile drapes were applied.  Using palpation of bony and soft tissue landmarks, active trigger points were located in the muscle groups listed above.    Next these muscles were injected with a total of Bupivacaine (0.25%)  9 mL using a 30 gauge 1 inch needle. No paresthesias occurred. The needle was removed. The patient did not experience any hemodynamic or neurologic sequelae.      Post operative instructions were explained and given to the patient. The patient tolerated the procedure well and was discharged in stable health.    Dr. Trinna Post  is the attending physician and was immediately available.

## 2021-07-22 NOTE — Telephone Encounter (Signed)
Routing to Nurse Vicente Males to enter all lab orders needed prior to pt's Jelmyto procedures

## 2021-07-22 NOTE — RN OR/Procedure Note (Signed)
Chart reviewed.  Consent signed with PA Halter prior to procedure.  Pt able to position self on procedure gurney.  Pt placed on NIBP and Spo2 for monitoring.  All VSS  Pt tolerated procedure well.  Pt recovered in the room and assessed by PA Halter and discharged instructions were given.  Patient cleared for discharge.  Patient was able to ambulate and left home in good health.

## 2021-07-24 ENCOUNTER — Other Ambulatory Visit: Payer: Self-pay

## 2021-07-26 NOTE — Telephone Encounter (Signed)
Spoke with pt - advised to complete labs this Friday per Nurse Vicente Males.     Advised surgery time may change for each week and I will confirm every week when he needs to check in for the following week. At this time, reviewed the following surgery dates/times with patient:    DATE -08/18  CHECK IN -9:00 AM    DATE -08/25  CHECK IN -1:00 PM    DATE -09/01  CHECK IN -8:00 AM     DATE -09/08  CHECK IN -8:00 AM    DATE -09/15  CHECK IN -8:00 AM    DATE -09/22  CHECK IN -1:00 PM    [facility] -08/10  COVID-19 test -not needed    Pt agreed and verbalized understanding.

## 2021-07-28 ENCOUNTER — Ambulatory Visit (INDEPENDENT_AMBULATORY_CARE_PROVIDER_SITE_OTHER): Payer: Medicare Other

## 2021-07-28 DIAGNOSIS — Z01818 Encounter for other preprocedural examination: Secondary | ICD-10-CM

## 2021-07-28 NOTE — Patient Instructions (Signed)
Your surgery is currently scheduled at [facility] on 08/05/2021  The scheduler will be contacting you with the check in time                                                                                 [place], [place], [place] [phone]  Check in and Operating room is down the elevator on the lower level (LL)    [place] structure, Microbiologist structure, or Dance movement psychotherapist parking (7am-5pm at [employer] entrance and [facility] Social research officer, government; 5am-5pm at [employer] by Emergency room/Labor and Delivery) for same cost as self-parking.  [email or web address]       QUESTIONS    If you have any questions between now and the day of your surgery, please do not hesitate to call:     [facility]: [place] [phone]      DAY OF SURGERY ARRIVAL TIME:    On the day of your Surgery/Procedure, please arrive at the time provided by the surgeon's clinic.  [facility] surgery Pre-op team will contact you the day before surgery.      MEDICATION INSTRUCTIONS BEFORE SURGERY/PROCEDURE:     OK to take your regular morning prescription medications as scheduled with a small sip of water on the morning of surgery.    PLEASE HOLD ALL NSAIDS (non-steroidal anti-inflammatory drugs) SUCH AS advil, aleve, motrin, ibuprofen, relafen, lodine, feldene, Diclofenac, voltaren, indomethacin, naproxen, celebrex, Mobic 7 days before surgery.       Please hold vitamins, supplements, herbs & fish oil 7 days before surgery.     It is OK to take acetaminophen (Tylenol) for pain around the time of surgery unless you have liver disease.      AFTER YOUR VISIT WITH [name], IF YOU START TAKING A NEW MEDICATION BEFORE SURGERY, PLEASE CALL us TO MAKE SURE IT IS SAFE TO TAKE & WILL NOT AFFECT YOUR SURGERY.         OSA INSTRUCTIONS:     If you use a CPAP machine, please bring  the entire machine, including mask and tubing, with you on the day of surgery.       EATING/DRINKING     DO NOT EAT OR DRINK ANYTHING AFTER MIDNIGHT ON THE DAY OF SURGERY      Preparing for your Surgery:     Please wear clean loose-fitting clothes and leave valuables at home   Do not shave or remove body hair. Facial shaving is permitted. If you are having head/face surgery, ask your surgeon's office whether you can shave.  Bring a picture ID and your insurance card, and be prepared to pay your deductible or co-insurance by cash, check, or credit card when you arrive.   All patients [facility] go home after their surgery, Please make sure to arrange for an adult to drive you home. You CANNOT use UBER or LYFT. If you do not have a ride, your surgery may be cancelled.       On The Day of Your Surgery:      Check in at  the location mentioned above   COVID testing may be done on arrival to the Pre-op or Procedural areas according to the current [facility]PH mandate.   If you are a woman of child bearing age, please note that you may be asked to give a urine sample upon check-in  You will meet your anesthesia and surgery teams in the preoperative holding area before surgery.   Once surgery is over, you will wake up in the recovery room.  An adult chaperone will need to stay with you for the first 24 hours after surgery.   Visitor policy during the MUWBQ-93 pandemic is subject to change. Current visitor policy can be found at [email or web address]      A video about what to expect for the day of surgery can be found here:    [email or web address]  Or by searching You-tube for [place] before surgery and [place] after surgery     Your medical records are available to you at [email or web address] click sign up now.

## 2021-07-28 NOTE — Interdisciplinary (Signed)
Anesthesia Preparedness Clinic [facility]) RN PHONE CALL NOTE    Phone call to patient from [facility]: Lambert Rhonda W RN today.     Confirmed medical history & that medications listed in Epic are accurate and up to date.    Last anesthesia event 06/16/2021 and no anesthesai complications.  Stated no change in medical condition since last surgery.  Pt denies any cardiac or pulmonary issues at this time.     Discussed pt with [facility] anesthesia (Dr Lennette Bihari) and ok for [facility].    Patient scheduled for surgery on 08/05/2021 at [facility].    Confirmed preoperative instructions with patient including NPO instructions.     Planning your surgery information sent to patient via mychart.    No questions or concerns at this time.

## 2021-07-29 ENCOUNTER — Other Ambulatory Visit: Payer: Self-pay

## 2021-07-30 ENCOUNTER — Other Ambulatory Visit: Payer: Medicare Other | Attending: Urology

## 2021-07-30 DIAGNOSIS — C679 Malignant neoplasm of bladder, unspecified: Secondary | ICD-10-CM | POA: Insufficient documentation

## 2021-07-30 DIAGNOSIS — N2889 Other specified disorders of kidney and ureter: Secondary | ICD-10-CM | POA: Insufficient documentation

## 2021-07-30 LAB — CBC WITH DIFF, BLOOD
ANC-Automated: 2.5 10*3/uL (ref 1.6–7.0)
Abs Basophils: 0 10*3/uL (ref <0.2)
Abs Eosinophils: 0.1 10*3/uL (ref 0.0–0.5)
Abs Lymphs: 2.2 10*3/uL (ref 0.8–3.1)
Abs Monos: 0.5 10*3/uL (ref 0.2–0.8)
Basophils: 1 %
Eosinophils: 3 %
Hct: 42.4 % (ref 40.0–50.0)
Hgb: 14.5 gm/dL (ref 13.7–17.5)
Lymphocytes: 41 %
MCH: 30.8 pg (ref 26.0–32.0)
MCHC: 34.2 g/dL (ref 32.0–36.0)
MCV: 90 um3 (ref 79.0–95.0)
MPV: 10.9 fL (ref 9.4–12.4)
Monocytes: 10 %
Plt Count: 180 10*3/uL (ref 140–370)
RBC: 4.71 10*6/uL (ref 4.60–6.10)
RDW: 13.8 % (ref 12.0–14.0)
Segs: 46 %
WBC: 5.4 10*3/uL (ref 4.0–10.0)

## 2021-07-30 LAB — URINALYSIS WITH CULTURE REFLEX, WHEN INDICATED
Bilirubin: NEGATIVE
Glucose: NEGATIVE
Ketones: NEGATIVE
Leuk Esterase: 75 Leu/uL — AB
Nitrite: NEGATIVE
Protein: NEGATIVE
Specific Gravity: 1.009 (ref 1.002–1.030)
Urobilinogen: NEGATIVE
pH: 6 (ref 5.0–8.0)

## 2021-07-31 LAB — URINE CULTURE: Urine Culture Result: NO GROWTH

## 2021-08-02 ENCOUNTER — Encounter (HOSPITAL_BASED_OUTPATIENT_CLINIC_OR_DEPARTMENT_OTHER): Payer: Self-pay | Admitting: Urology

## 2021-08-02 DIAGNOSIS — N1330 Unspecified hydronephrosis: Secondary | ICD-10-CM

## 2021-08-02 DIAGNOSIS — C662 Malignant neoplasm of left ureter: Secondary | ICD-10-CM

## 2021-08-04 ENCOUNTER — Other Ambulatory Visit (HOSPITAL_BASED_OUTPATIENT_CLINIC_OR_DEPARTMENT_OTHER): Payer: Self-pay | Admitting: Urology

## 2021-08-04 ENCOUNTER — Encounter (HOSPITAL_BASED_OUTPATIENT_CLINIC_OR_DEPARTMENT_OTHER): Payer: Self-pay

## 2021-08-04 ENCOUNTER — Telehealth (HOSPITAL_BASED_OUTPATIENT_CLINIC_OR_DEPARTMENT_OTHER): Payer: Self-pay | Admitting: Urology

## 2021-08-04 DIAGNOSIS — Z419 Encounter for procedure for purposes other than remedying health state, unspecified: Secondary | ICD-10-CM

## 2021-08-04 NOTE — Telephone Encounter (Signed)
Left message to call back    Please confirm that patient feeling well/asymptomatic (free from flank pain, fever, hematuria, etc).

## 2021-08-04 NOTE — Telephone Encounter (Signed)
Pt calling back per prior message pt states he just missed [place] phone call.

## 2021-08-04 NOTE — Telephone Encounter (Signed)
Pt is calling to confirm appmnt for tomorrow, he said he will in early.    Ph:[phone]

## 2021-08-04 NOTE — Telephone Encounter (Signed)
Left message to call back    Courtesy call to see how patient is doing.  Would like to confirm tomorrow's procedure and that patient feeling well/asymptomatic (free from flank pain, fever, hematuria, etc).

## 2021-08-04 NOTE — Telephone Encounter (Signed)
Called and spoke with patient.  Confirms he is feeling well/asymptomatic(free fromflank pain, fever, hematuria, etc).

## 2021-08-05 ENCOUNTER — Ambulatory Visit
Admission: RE | Admit: 2021-08-05 | Discharge: 2021-08-05 | Disposition: A | Discharge status: Home / Self Care | Payer: Medicare Other | Attending: Urology | Admitting: Urology

## 2021-08-05 ENCOUNTER — Other Ambulatory Visit: Payer: Self-pay

## 2021-08-05 ENCOUNTER — Ambulatory Visit (HOSPITAL_BASED_OUTPATIENT_CLINIC_OR_DEPARTMENT_OTHER): Payer: Medicare Other | Admitting: Anesthesiology

## 2021-08-05 ENCOUNTER — Encounter (HOSPITAL_BASED_OUTPATIENT_CLINIC_OR_DEPARTMENT_OTHER): Admission: RE | Disposition: A | Discharge status: Home Health | Payer: Self-pay | Attending: Urology

## 2021-08-05 ENCOUNTER — Ambulatory Visit (HOSPITAL_BASED_OUTPATIENT_CLINIC_OR_DEPARTMENT_OTHER): Payer: Medicare Other

## 2021-08-05 DIAGNOSIS — C662 Malignant neoplasm of left ureter: Secondary | ICD-10-CM | POA: Insufficient documentation

## 2021-08-05 DIAGNOSIS — E669 Obesity, unspecified: Secondary | ICD-10-CM

## 2021-08-05 DIAGNOSIS — G8929 Other chronic pain: Secondary | ICD-10-CM

## 2021-08-05 DIAGNOSIS — Z9049 Acquired absence of other specified parts of digestive tract: Secondary | ICD-10-CM | POA: Insufficient documentation

## 2021-08-05 DIAGNOSIS — Z880 Allergy status to penicillin: Secondary | ICD-10-CM | POA: Insufficient documentation

## 2021-08-05 DIAGNOSIS — K219 Gastro-esophageal reflux disease without esophagitis: Secondary | ICD-10-CM

## 2021-08-05 DIAGNOSIS — C679 Malignant neoplasm of bladder, unspecified: Secondary | ICD-10-CM

## 2021-08-05 DIAGNOSIS — I10 Essential (primary) hypertension: Secondary | ICD-10-CM

## 2021-08-05 DIAGNOSIS — Z87442 Personal history of urinary calculi: Secondary | ICD-10-CM | POA: Insufficient documentation

## 2021-08-05 DIAGNOSIS — Z905 Acquired absence of kidney: Secondary | ICD-10-CM | POA: Insufficient documentation

## 2021-08-05 DIAGNOSIS — Z419 Encounter for procedure for purposes other than remedying health state, unspecified: Secondary | ICD-10-CM

## 2021-08-05 DIAGNOSIS — Z9889 Other specified postprocedural states: Secondary | ICD-10-CM

## 2021-08-05 DIAGNOSIS — C676 Malignant neoplasm of ureteric orifice: Secondary | ICD-10-CM

## 2021-08-05 DIAGNOSIS — Z882 Allergy status to sulfonamides status: Secondary | ICD-10-CM | POA: Insufficient documentation

## 2021-08-05 DIAGNOSIS — C652 Malignant neoplasm of left renal pelvis: Secondary | ICD-10-CM

## 2021-08-05 LAB — COVID-19 BINAXNOW ANTIGEN (POCT): COVID-19 Antigen (POCT): NEGATIVE

## 2021-08-05 SURGERY — CYSTOSCOPY, WITH RETROGRADE PYELOGRAM
Anesthesia: General | Site: Pelvis

## 2021-08-05 MED ORDER — LACTATED RINGERS IV SOLN
INTRAVENOUS | Status: DC
Start: 2021-08-05 — End: 2021-08-05

## 2021-08-05 MED ORDER — LACTATED RINGERS IV SOLN
INTRAVENOUS | Status: DC | PRN
Start: 2021-08-05 — End: 2021-08-05

## 2021-08-05 MED ORDER — ONDANSETRON HCL 4 MG/2ML IV SOLN
4.0000 mg | Freq: Once | INTRAMUSCULAR | Status: DC | PRN
Start: 2021-08-05 — End: 2021-08-05

## 2021-08-05 MED ORDER — IOHEXOL 240 MG/ML IJ SOLN
INTRAMUSCULAR | Status: DC | PRN
Start: 2021-08-05 — End: 2021-08-05
  Administered 2021-08-05 (×2): 20 mL

## 2021-08-05 MED ORDER — PROPOFOL 200 MG/20ML IV EMUL
INTRAVENOUS | Status: DC | PRN
Start: 2021-08-05 — End: 2021-08-05
  Administered 2021-08-05 (×2): 150 mg via INTRAVENOUS

## 2021-08-05 MED ORDER — DIPHENHYDRAMINE HCL 50 MG/ML IJ SOLN
12.5000 mg | Freq: Once | INTRAMUSCULAR | Status: DC | PRN
Start: 2021-08-05 — End: 2021-08-05

## 2021-08-05 MED ORDER — VANCOMYCIN HCL 1 GM IV SOLR
INTRAVENOUS | Status: DC | PRN
Start: 2021-08-05 — End: 2021-08-05
  Administered 2021-08-05 (×2): 1000 mg via INTRAVENOUS

## 2021-08-05 MED ORDER — DEXAMETHASONE SODIUM PHOSPHATE 4 MG/ML IJ SOLN (CUSTOM)
INTRAMUSCULAR | Status: DC | PRN
Start: 2021-08-05 — End: 2021-08-05
  Administered 2021-08-05 (×2): 8 mg via INTRAVENOUS

## 2021-08-05 MED ORDER — FENTANYL CITRATE (PF) 250 MCG/5ML IJ SOLN
INTRAMUSCULAR | Status: DC | PRN
Start: 2021-08-05 — End: 2021-08-05
  Administered 2021-08-05 (×2): 100 ug via INTRAVENOUS

## 2021-08-05 MED ORDER — CIPROFLOXACIN HCL 500 MG OR TABS
500.0000 mg | ORAL_TABLET | Freq: Two times a day (BID) | ORAL | 0 refills | Status: DC
Start: 2021-08-05 — End: 2021-08-12

## 2021-08-05 MED ORDER — MIDAZOLAM HCL 2 MG/2ML IJ SOLN
INTRAMUSCULAR | Status: AC
Start: 2021-08-05 — End: ?
  Filled 2021-08-05: qty 2

## 2021-08-05 MED ORDER — EPINEPHRINE DILUTION 10 MCG/ML IJ SOLN
INTRATRACHEAL | Status: DC | PRN
Start: 2021-08-05 — End: 2021-08-05
  Administered 2021-08-05 (×2): 1 ug via INTRAVENOUS

## 2021-08-05 MED ORDER — PROPOFOL 1000 MG/100ML IV EMUL
INTRAVENOUS | Status: DC | PRN
Start: 2021-08-05 — End: 2021-08-05
  Administered 2021-08-05: 30 ug/kg/min via INTRAVENOUS

## 2021-08-05 MED ORDER — FENTANYL CITRATE (PF) 50 MCG/ML IJ SOLN (WRAPPED RECORD) ~~LOC~~
25.0000 ug | INTRAMUSCULAR | Status: DC | PRN
Start: 2021-08-05 — End: 2021-08-05

## 2021-08-05 MED ORDER — GLYCOPYRROLATE 1 MG/5ML IJ SOLN
INTRAMUSCULAR | Status: DC | PRN
Start: 2021-08-05 — End: 2021-08-05
  Administered 2021-08-05 (×3): .2 mg via INTRAVENOUS

## 2021-08-05 MED ORDER — MIDAZOLAM HCL 2 MG/2ML IJ SOLN
INTRAMUSCULAR | Status: DC | PRN
Start: 2021-08-05 — End: 2021-08-05
  Administered 2021-08-05 (×2): 2 mg via INTRAVENOUS

## 2021-08-05 MED ORDER — ONDANSETRON HCL 4 MG/2ML IV SOLN
INTRAMUSCULAR | Status: DC | PRN
Start: 2021-08-05 — End: 2021-08-05
  Administered 2021-08-05 (×2): 4 mg via INTRAVENOUS

## 2021-08-05 MED ORDER — FENTANYL CITRATE (PF) 50 MCG/ML IJ SOLN (WRAPPED RECORD) ~~LOC~~
50.0000 ug | INTRAMUSCULAR | Status: DC | PRN
Start: 2021-08-05 — End: 2021-08-05

## 2021-08-05 MED ORDER — LIDOCAINE HCL 2% EX GEL (UROJET)
Status: AC
Start: 2021-08-05 — End: ?
  Filled 2021-08-05: qty 10

## 2021-08-05 MED ORDER — MITOMYCIN 80 (2 X 40) MG UL SOLR
60.0000 mg | Freq: Once | URETERAL | Status: DC
Start: 2021-08-05 — End: 2021-08-05

## 2021-08-05 MED ORDER — HYDROMORPHONE HCL 1 MG/ML IJ SOLN
0.5000 mg | INTRAMUSCULAR | Status: DC | PRN
Start: 2021-08-05 — End: 2021-08-05

## 2021-08-05 MED ORDER — BELLADONNA ALKALOIDS-OPIUM 16.2-60 MG RE SUPP
1.0000 | Freq: Once | RECTAL | Status: DC
Start: 2021-08-05 — End: 2021-08-05

## 2021-08-05 MED ORDER — EPHEDRINE SULFATE (PRESSORS) 50 MG/ML IV SOLN
INTRAVENOUS | Status: DC | PRN
Start: 2021-08-05 — End: 2021-08-05
  Administered 2021-08-05 (×2): 7.5 mg via INTRAVENOUS

## 2021-08-05 MED ORDER — OXYCODONE HCL 5 MG OR TABS
5.0000 mg | ORAL_TABLET | Freq: Once | ORAL | Status: DC | PRN
Start: 2021-08-05 — End: 2021-08-05

## 2021-08-05 MED ORDER — FENTANYL CITRATE (PF) 100 MCG/2ML IJ SOLN
INTRAMUSCULAR | Status: AC
Start: 2021-08-05 — End: ?
  Filled 2021-08-05: qty 2

## 2021-08-05 MED ORDER — MITOMYCIN (JELMYTO) 4 MG/ML PYELOCALYCEAL SOLUTION BLADDER INSTILLATION
60.0000 mg | Freq: Once | INTRAVESICAL | Status: AC
Start: 2021-08-05 — End: 2021-08-05
  Administered 2021-08-05: 60 mg via INTRAVESICAL
  Filled 2021-08-05: qty 15

## 2021-08-05 MED ORDER — NALOXONE HCL 0.4 MG/ML IJ SOLN
0.1000 mg | INTRAMUSCULAR | Status: DC | PRN
Start: 2021-08-05 — End: 2021-08-05

## 2021-08-05 MED ORDER — ACETAMINOPHEN 325 MG PO TABS
975.0000 mg | ORAL_TABLET | Freq: Once | ORAL | Status: AC
Start: 2021-08-05 — End: 2021-08-05
  Administered 2021-08-05 (×2): 975 mg via ORAL
  Filled 2021-08-05: qty 3

## 2021-08-05 SURGICAL SUPPLY — 14 items
BAG CYSTO DRAIN SKYTRON TABLE (Misc Surgical Supply) ×2 IMPLANT
CATHETER URETERAL OPEN END 5FR X 70CM (Procedural wires/sheaths/catheters/balloons/dilators) IMPLANT
GLOVE BIOGEL PI INDICATOR SIZE 6.5 (Gloves/Gowns) ×2
GLOVE BIOGEL PI ULTRATOUCH SIZE 6.5 (Gloves/Gowns) ×2 IMPLANT
GLOVE SURGEON BIOGEL SIZE 7.5 (Gloves/Gowns) ×2 IMPLANT
GUIDEWIRE SENSOR DUAL FLEX STRAIGHT TIP .035 X 150CM NITINOL (Procedural wires/sheaths/catheters/balloons/dilators) ×2 IMPLANT
SCRUB, SURGICAL, EXIDINE, 4%, CHG, 4OZ (Prep Solutions) ×2
SLEEVE SCD KNEE MEDIUM (Patient Care Supply) ×2 IMPLANT
SOLUTION IRR BAG .9% N/S 3000ML (Non-Pharmacy Meds/Solutions) ×2
SOLUTION IRR POUR BTL 0.9% NS 1000ML (Non-Pharmacy Meds/Solutions) IMPLANT
SURGICAL PACK CYSTO - SAME DAY (Procedure Packs/kits) ×2 IMPLANT
TRAY FOLEY SURESTEP LUBRI-SIL I.C.16FR URIMETER, LF (Lines/Drains)
TUBING SUCTION MEDI-VAC 9/32" X 20' (Tubing/Suction) ×2
UNDERPAD 30X36 (Patient Care Supply) ×2 IMPLANT

## 2021-08-05 NOTE — Anesthesia Preprocedure Evaluation (Addendum)
ANESTHESIA PRE-OPERATIVE EVALUATION    Patient Information    Name: Stephen [name]    MRN: [id number]    DOB: December 31, 1950    Age: 70 year old    Sex: male  Procedure(s) with comments:  CYSTOSCOPY, WITH RETROGRADE PYELOGRAM WITH JELMYTO INSTILLATION - with jelmyto, will be done once/week for six weeks consecutive      Pre-op Vitals:   BP 122/81 (BP Location: Left arm, BP Patient Position: Sitting)    Pulse 54    Temp 36 C    Resp 17    Ht '5\' 6"'  (1.676 m)    Wt 92.2 kg (203 lb 4.8 oz)    SpO2 97%    BMI 32.81 kg/m    BMI kg/m2: 32.81 kg/m2    Primary language spoken:  English    ROS/Medical History:      History of Present Illness: Stephen [name] is a 70 year old male with congenital hydronephrosis at 70 yo, TURP and recurrent BNC previously managed with [facility], then SPT, now s/p DVIU with [facility] 12/07/2017. Hx of extensive lumbar-sacral spinal fusion.    Found to have UTUC planned for cystoscopy w/ retrograde pyelogram with jelmyto instillation.     General:  positive for Obesity,  able to climb flight of stairs/Exercise tolaerance >4 mets,   Cardiovascular:  no CAD/Angina/MI/CABG/Stents, Anti-platelet drugs: No,   no LV failure/CHF,   hypertension,  Walks to house and yard, stairs 2/2 back pain he is limited. Denies SOB or C/P.    Baseline HR 40-50's on monitor.   Anesthesia History:  negative anesthesia history ROS  no history of anesthetic complications,  no PONV,  chronic pain patient,  Previous Grade III needed bougie.    Most recent airway note:   05/05/21; Placed By Anesthesia, Insertion Attempts 1; Laryngoscope View Grade 1; Laryngoscopy Technique Glidescope (T3); Airway Device ETT - Cuffed; Size (mm) 7.0 Pulmonary:   negative pulmonary ROS  no asthma,  no COPD,  no sleep apnea,     Neuro/Psych:   negative for TIA/CVA,  no seizures,  psychiatric history,  Multiple spinal surgeries Hematology/Oncology:       GI/Hepatic:  GERD,  no liver disease,   Infectious Disease:     Renal:  H/o urethral stricture and TURP in  the past. Hx of prior nephrectomy 2/2 congenital hydronephrosis.     BLE edematous and erythemic.  Endocrine/Other:  no diabetes,  arthritis,   back pain,  Hx of lumbar-sacral spinal fusion; multiple back surgeries.   Pregnancy History:   Pediatrics:         Pre Anesthesia Testing (PCC/CPC) notes/comments:                 Physical Exam    Airway:      Comment: Beard Inter-inciser distance > 4 cm  Prognanth Able    Mallampati: III  Neck ROM: full  TM distance: 5-6 cm  Short thick neck: No          Cardiovascular:    Comment: Bradycardia   Rhythm: regular   Rate: abnormal         Pulmonary:  - pulmonary exam normal      breath sounds clear to auscultation        Neuro/Neck/Skeletal/Skin:  - Neck/Neuro/Skeletal/Skin exam normal          Dental:    Comment: Multiple cracked and broken teeth, enamel fractures, poor dentition    Abdominal:      General:  obesity     Additional Clinical Notes:               Last  OSA (STOP BANG) Score:  No data recorded    Last OSA  (STOP) Score for   Has a physician diagnosed you with sleep apnea?: No  Do you use a CPAP at home?: No  Do you snore loudly (loud enough to be heard through a closed door)?: 0  Do you often feel tired, fatigued or sleepy during the day?: 1  Has anyone observed that you stop breathing while you are sleeping?: 0  Have you ever been treated for high blood pressure?: 1  OSA total score (A score of 2 or more is high risk. Offer patient sleep study.): 2                   Past Medical History:   Diagnosis Date    Chronic back pain     Congenital hydronephrosis     Gout     Headache     Hematuria     Kidney disease     Kidney stones     Major depressive disorder, single episode     Polyarthropathy or polyarthritis of multiple sites     Retinal detachment     Urethral stricture      Past Surgical History:   Procedure Laterality Date    CT INSERTION OF SUPRAPUBIC CATH  09/25/2015    NEPHRECTOMY Right 1995    APPENDECTOMY      COLONOSCOPY      CYSTOSCOPY       CYSTOSCOPY W/ LASER LITHOTRIPSY      OTHER SURGICAL HISTORY      Interstim 01/29/2011    SPINE SURGERY  09/21    Lumbar-sacral fusion    TRANSURETHRAL RESECTION OF PROSTATE       Social History     Socioeconomic History    Marital status: Single   Tobacco Use    Smoking status: Never Smoker    Smokeless tobacco: Never Used   Substance and Sexual Activity    Alcohol use: No    Drug use: Not Currently    Sexual activity: Not Currently     Partners: Female   Other Topics Concern    Military Service No    Blood Transfusions Yes    Caffeine Concern No    Occupational Exposure No    Hobby Hazards No    Sleep Concern Yes     Comment: due to my lower back discomfort and leg discomfort    Stress Concern Yes     Comment: Health/ housing/ financial    Weight Concern No    Special Diet No    Back Care Yes     Comment: Am very careful with any activity    Exercises Regularly Yes    Seat Belt Use Yes    Performs Self-Exams Yes    Bike Helmet Use No     Alcohol Use: Not on file       Current Facility-Administered Medications   Medication Dose Route Frequency Provider Last Rate Last Admin    lactated ringers infusion   IntraVENOUS Continuous Carlota Raspberry, MD 30 mL/hr at 08/05/21 1200 New Bag at 08/05/21 1200    mitomycin (JELMYTO) 4 mg/mL pyelocalyceal solution bladder instillation 60 mg  60 mg IntraVESICAL Once Carlota Raspberry, MD         Allergies   Allergen Reactions    Amoxicillin Rash  Sulfa Drugs Unspecified       Labs and Other Data  Lab Results   Component Value Date    NA 139 04/28/2021    K 4.3 04/28/2021    CL 102 04/28/2021    BICARB 24 04/28/2021    BUN 21 04/28/2021    CREAT 1.25 (H) 04/28/2021    GLU 96 04/28/2021    [place] 10.0 04/28/2021     Lab Results   Component Value Date    AST 27 08/18/2020    ALT 9 08/18/2020    ALK 64 08/18/2020    TP 6.8 08/18/2020    ALB 4.7 08/18/2020    TBILI 1.00 08/18/2020     Lab Results   Component Value Date    WBC 5.4 07/30/2021    RBC 4.71  07/30/2021    HGB 14.5 07/30/2021    HCT 42.4 07/30/2021    MCV 90.0 07/30/2021    MCHC 34.2 07/30/2021    RDW 13.8 07/30/2021    PLT 180 07/30/2021    MPV 10.9 07/30/2021    SEG 46 07/30/2021    LYMPHS 41 07/30/2021    MONOS 10 07/30/2021    EOS 3 07/30/2021    BASOS 1 07/30/2021     Lab Results   Component Value Date    INR 1.2 08/18/2020    PTT 28 08/18/2020     Lab Results   Component Value Date    ARTPH 7.42 08/18/2020    ARTPO2 218 (H) 08/18/2020    ARTPCO2 35 (L) 08/18/2020       Anesthesia Plan:  Risks and Benefits of Anesthesia  I have personally performed an appropriate pre-anesthesia physical exam of the patient (including heart, lungs, and airway) prior to the anesthetic and reviewed the pertinent medical history, drug and allergy history, laboratory and imaging studies and consultations.   I have determined that the patient has had adequate assessment and testing.  I have validated the documentation of these elements of the patient exam and/or have made necessary changes to reflect my own observations during my pre-anesthesia exam.  Anesthetic techniques, invasive monitors, anesthetic drugs for induction, maintenance and post-operative analgesia, risks and alternatives have been explained to the patient and/or patient's representatives.    I have prescribed the anesthetic plan:         Planned anesthesia method: General         ASA 3 (Severe systemic disease)     Potential anesthesia problems identified and risks including but not limited to the following were discussed with patient and/or patient's representative: Adverse or allergic drug reaction, Recall, Dental injury or sore throat, Ocular injury and Injury to brain, heart and other organs    No Beta Blocker Indicated: Does not meet criteria    Planned monitoring method: Routine monitoring    Informed Consent:  Anesthetic plan and risks discussed with Patient.    Plan discussed with Resident and Attending.

## 2021-08-05 NOTE — Anesthesia Postprocedure Evaluation (Signed)
Anesthesia Post Note    Patient: Stephen [name]    Procedure(s) Performed: Procedure(s) with comments:  CYSTOSCOPY, WITH RETROGRADE PYELOGRAM WITH JELMYTO INSTILLATION - with jelmyto, will be done once/week for six weeks consecutive      Final anesthesia type: General    Patient location: PACU    Post anesthesia pain: adequate analgesia    Mental status: awake, alert  and oriented    Airway Patent: Yes    Last Vitals:   Vitals Value Taken Time   BP 122/80 08/05/21 1555   Temp 36.3 C 08/05/21 1550   Pulse 67 08/05/21 1555   Resp 16 08/05/21 1550   SpO2 97 % 08/05/21 1556   Vitals shown include unvalidated device data.     Post vital signs: stable    Hydration: adequate    N/V:no    Anesthetic complications: no    Plan of care per primary team.

## 2021-08-05 NOTE — Plan of Care (Signed)
Problem: Promotion of Perioperative Health and Safety  Goal: Promotion of Health and Safety of the Perioperative Patient  Description: The patient remains safe, receives treatment appropriate to the surgical intervention and patient's physiological needs and is discharged or transferred to the appropriate level of care.    Information below is the current care plan.  Outcome: Progressing  Flowsheets (Taken 08/05/2021 1527)  Guidelines: PACU

## 2021-08-05 NOTE — Discharge Instructions (Signed)
Resume all previous activities.    Resume all previous medications. Take cipro x3 days.    Please call [place] if unable to urinate, fever, nausea, vomiting, or chills.

## 2021-08-05 NOTE — H&P (Signed)
UROLOGY HISTORY AND PHYSICAL    Chief complaint:  UTUC    History of Present Illness:     Stephen [name] is a 70 year old male with UTUC s/p URS.    Past Medical and Surgical History:  Past Medical History:   Diagnosis Date    Chronic back pain     Congenital hydronephrosis     Gout     Headache     Hematuria     Kidney disease     Kidney stones     Major depressive disorder, single episode     Polyarthropathy or polyarthritis of multiple sites     Retinal detachment     Urethral stricture      Past Surgical History:   Procedure Laterality Date    CT INSERTION OF SUPRAPUBIC CATH  09/25/2015    NEPHRECTOMY Right 1995    APPENDECTOMY      COLONOSCOPY      CYSTOSCOPY      CYSTOSCOPY W/ LASER LITHOTRIPSY      OTHER SURGICAL HISTORY      Interstim 01/29/2011    SPINE SURGERY  09/21    Lumbar-sacral fusion    TRANSURETHRAL RESECTION OF PROSTATE         Allergies:  Allergies   Allergen Reactions    Amoxicillin Rash    Sulfa Drugs Unspecified       Medications:  Current Facility-Administered Medications   Medication    lactated ringers infusion    mitomycin (JELMYTO) 4 mg/mL pyelocalyceal solution bladder instillation 60 mg       Social History:  Social History     Socioeconomic History    Marital status: Single   Tobacco Use    Smoking status: Never Smoker    Smokeless tobacco: Never Used   Substance and Sexual Activity    Alcohol use: No    Drug use: Not Currently    Sexual activity: Not Currently     Partners: Female   Social Activities of Daily Living Present    Military Service No    Blood Transfusions Yes    Caffeine Concern No    Occupational Exposure No    Hobby Hazards No    Sleep Concern Yes     Comment: due to my lower back discomfort and leg discomfort    Stress Concern Yes     Comment: Health/ housing/ financial    Weight Concern No    Special Diet No    Back Care Yes     Comment: Am very careful with any activity    Exercises Regularly Yes    Seat Belt Use Yes     Performs Self-Exams Yes    Bike Helmet Use No       Family History:  Family History   Adopted: Yes   Family history unknown: Yes       Review of Systems:  Constitutional: denies fatigue, night sweats, weight loss, fever.  Eyes: denies changes in vision  Ears, Nose, Mouth, Throat: denies difficulty swallowing, sore throat, nosebleeds, rhinorrhea.  CV: denies palpitations, chest pain, lower extremity edema, or claudication.  Resp: denies productive cough, shortness of breath, wheezing, dyspnea on exertion.  GI: denies heartburn, hematemesis, bleeding from rectum, melena, jaundice, constipation or diarrhea.  GU: Per HPI  Musculoskeletal: denies joint stiffness, joint swelling, joint pain, joint redness, back pain, or muscle pain.  Neuro: denies confusion, loss of consciousness, headaches, paralysis/weakness, numbness or tingling.  Endo: denies symptoms of diabetes  Heme/Lymphatic: denies abnormal bleeding, bruising, or swollen lymph nodes.    Physical Exam:  BP 122/81 (BP Location: Left arm, BP Patient Position: Sitting)    Pulse 54    Temp 96.8 F (36 C)    Resp 17    Ht '5\' 6"'  (1.676 m)    Wt 92.2 kg (203 lb 4.8 oz)    SpO2 97%    BMI 32.81 kg/m   GEN: NAD, awake, alert/oriented   HEENT: EOMI, atraumatic  CARDIO: Regular rate and rhythm, warm extremities  PULM: Non-labored breathing on room air, speaking in full sentences  GI: Abdomen soft, non-distended, non-tender  GU: Deferred  SKIN: warm and dry  NEURO: No gross neurological deficits      Labs and Other Data:  No results found for: NA, K, CL, BICARB, BUN, CREAT, GLU, [place]  No results found for: WBC, HGB, HCT, PLT, SEG, BAND, LYMPHS, MONOS, EOS, NRBC  No results found for: AST, ALT, ALK, TBILI, DBILI, TP, ALB  No results found for: INR, PTT      Imaging:  No results found.    Assessment and Plan:  70 year old male with UTUC s/p URS presenting for repeat procedure    - Proceed to OR for planned procedure    Discussed with attending Dr. Sallyanne Kuster.    Alinda Dooms, MD  Urology PGY2    08/05/21, 1:34 PM

## 2021-08-05 NOTE — Anesthesia Preprocedure Evaluation (Deleted)
ANESTHESIA PRE-OPERATIVE EVALUATION    Patient Information    Name: Stephen [name]    MRN: [id number]    DOB: 02/08/1951    Age: 70 year old    Sex: male  Procedure(s) with comments:  CYSTOSCOPY, WITH RETROGRADE PYELOGRAM WITH JELMYTO INSTILLATION - with jelmyto, will be done once/week for six weeks consecutive      Pre-op Vitals:   Temp 36 C    Ht '5\' 6"'  (1.676 m)    Wt 92.2 kg (203 lb 4.8 oz)    BMI 32.81 kg/m    BMI kg/m2: 32.81 kg/m2    Primary language spoken:  English    ROS/Medical History:          General:  positive for Obesity,   Cardiovascular:  hypertension,     Anesthesia History:  history of difficult intubation,   Pulmonary:   negative pulmonary ROS     Neuro/Psych:   psychiatric history (depression),   Hematology/Oncology:   hematologic/lymphatic negative      GI/Hepatic:  GERD,   Infectious Disease:  negative for infectious disease     Renal:  Congenital hydronephrosis  Kidney stones  Urethral stricture  Hematuria   Endocrine/Other:  arthritis,   back pain,  Gout   Pregnancy History:   Pediatrics:         Pre Anesthesia Testing (PCC/CPC) notes/comments:                 Physical Exam    Airway:    Inter-inciser distance 3-4 cm    Mallampati: III  Neck ROM: limited  TM distance: 5-6 cm            Cardiovascular:  - cardiovascular exam normal         Pulmonary:  - pulmonary exam normal           Neuro/Neck/Skeletal/Skin:  - Neck/Neuro/Skeletal/Skin exam normal          Dental:    Comment: Multiple missing; extremely poor dentition, with many missing teeth and all remaining dentition shows active decay with enamel fractures.      Abdominal:              Last  OSA (STOP BANG) Score:  No data recorded    Last OSA  (STOP) Score for   Has a physician diagnosed you with sleep apnea?: No  Do you use a CPAP at home?: No  Do you snore loudly (loud enough to be heard through a closed door)?: 0  Do you often feel tired, fatigued or sleepy during the day?: 1  Has anyone observed that you stop breathing while you  are sleeping?: 0  Have you ever been treated for high blood pressure?: 1  OSA total score (A score of 2 or more is high risk. Offer patient sleep study.): 2                   Past Medical History:   Diagnosis Date    Chronic back pain     Congenital hydronephrosis     Gout     Headache     Hematuria     Kidney disease     Kidney stones     Major depressive disorder, single episode     Polyarthropathy or polyarthritis of multiple sites     Retinal detachment     Urethral stricture      Past Surgical History:   Procedure Laterality Date    CT INSERTION  OF SUPRAPUBIC CATH  09/25/2015    NEPHRECTOMY Right 1995    APPENDECTOMY      COLONOSCOPY      CYSTOSCOPY      CYSTOSCOPY W/ LASER LITHOTRIPSY      OTHER SURGICAL HISTORY      Interstim 01/29/2011    SPINE SURGERY  09/21    Lumbar-sacral fusion    TRANSURETHRAL RESECTION OF PROSTATE       Social History     Socioeconomic History    Marital status: Single   Tobacco Use    Smoking status: Never Smoker    Smokeless tobacco: Never Used   Substance and Sexual Activity    Alcohol use: No    Drug use: Not Currently    Sexual activity: Not Currently     Partners: Female   Other Topics Concern    Military Service No    Blood Transfusions Yes    Caffeine Concern No    Occupational Exposure No    Hobby Hazards No    Sleep Concern Yes     Comment: due to my lower back discomfort and leg discomfort    Stress Concern Yes     Comment: Health/ housing/ financial    Weight Concern No    Special Diet No    Back Care Yes     Comment: Am very careful with any activity    Exercises Regularly Yes    Seat Belt Use Yes    Performs Self-Exams Yes    Bike Helmet Use No     Alcohol Use: Not on file       Current Facility-Administered Medications   Medication Dose Route Frequency Provider Last Rate Last Admin    acetaminophen (TYLENOL) tablet 975 mg  975 mg Oral Once Carlota Raspberry, MD        lactated ringers infusion   IntraVENOUS Continuous Carlota Raspberry, MD        mitomycin (JELMYTO) 4 mg/mL pyelocalyceal solution bladder instillation 60 mg  60 mg IntraVESICAL Once Carlota Raspberry, MD         Allergies   Allergen Reactions    Amoxicillin Rash    Sulfa Drugs Unspecified       Labs and Other Data  Lab Results   Component Value Date    NA 139 04/28/2021    K 4.3 04/28/2021    CL 102 04/28/2021    BICARB 24 04/28/2021    BUN 21 04/28/2021    CREAT 1.25 (H) 04/28/2021    GLU 96 04/28/2021    [place] 10.0 04/28/2021     Lab Results   Component Value Date    AST 27 08/18/2020    ALT 9 08/18/2020    ALK 64 08/18/2020    TP 6.8 08/18/2020    ALB 4.7 08/18/2020    TBILI 1.00 08/18/2020     Lab Results   Component Value Date    WBC 5.4 07/30/2021    RBC 4.71 07/30/2021    HGB 14.5 07/30/2021    HCT 42.4 07/30/2021    MCV 90.0 07/30/2021    MCHC 34.2 07/30/2021    RDW 13.8 07/30/2021    PLT 180 07/30/2021    MPV 10.9 07/30/2021    SEG 46 07/30/2021    LYMPHS 41 07/30/2021    MONOS 10 07/30/2021    EOS 3 07/30/2021    BASOS 1 07/30/2021     Lab Results   Component Value Date    INR  1.2 08/18/2020    PTT 28 08/18/2020     Lab Results   Component Value Date    ARTPH 7.42 08/18/2020    ARTPO2 218 (H) 08/18/2020    ARTPCO2 35 (L) 08/18/2020       Anesthesia Plan:  Risks and Benefits of Anesthesia  I have personally performed an appropriate pre-anesthesia physical exam of the patient (including heart, lungs, and airway) prior to the anesthetic and reviewed the pertinent medical history, drug and allergy history, laboratory and imaging studies and consultations.   I have determined that the patient has had adequate assessment and testing.  I have validated the documentation of these elements of the patient exam and/or have made necessary changes to reflect my own observations during my pre-anesthesia exam.  Anesthetic techniques, invasive monitors, anesthetic drugs for induction, maintenance and post-operative analgesia, risks and alternatives have been explained to the patient  and/or patient's representatives.    I have prescribed the anesthetic plan:         Planned anesthesia method: General         ASA 3 (Severe systemic disease)           Planned monitoring method: Routine monitoring    Informed Consent:  Anesthetic plan and risks discussed with Patient.

## 2021-08-06 NOTE — Op Note (Signed)
UROLOGY OPERATIVE REPORT    PATIENT NAME: Stephen [name]    MRN: [id number]    DATE OF OPERATION: 08/05/21    CASE ID: [id number]    ATTENDING SURGEON: Carlota Raspberry, MD    ASSISTANT SURGEON(S):   1. Alinda Dooms, MD    PREOPERATIVE DIGNOSIS: recurrent UTUC    POSTOPERATIVE DIAGNOSIS: same    PROCEDURE(s):   1. Cystoscopy, retrograde pyelogram, insertion of Jelmyto    ANESTHESIA: Gen    ANTIBIOTICS: Vancomycin     OPERATIVE FINDINGS:  1. Normal cystoscopy  2. Left retrograde pyelogram without filling defects, average contrast volume ~15cc  3. Instillation of Jelmyto     INDICATIONS FOR PROCEDURE:  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 He is now s/p ureteroscopy with laser fulguration of lesions 05/05/21 and 06/16/21. He has elected to proceed with mitomycin injections for disease control. He presents today for first instillation.    PROCEDURE IN DETAIL:  The patient was met and examined in the preoperative area. The indications, risks, benefits, and alternatives were explained to the patient.  All questions were answered and informed consent was obtained. The patient was then brought to the operating room and positioned supine on the surgical bed. Sequential compression devices were applied to bilateral lower extremities. Prior to induction of anesthesia, OR brief was performed confirming the correct patient, patient information, procedure and equipment. Once anesthesia was induced, the patient was placed in a lithotomy position, prepped and draped in the usual sterile fashion, and antibiotics were administered.    After a timeout was performed, we began by entering the bladder with a 21Fr cystoscope. The bladder did not have any lesions. We grasped the indwelling left ureteral stent with flexible graspers and brought it to the meatus. We then placed a straight sensor wire through the stent to the level of the renal pelvis  confirmed under fluorsoscopy. The Jelmyto injection ureteral catheter was placed over the wire to the level of the L UPJ under fluoroscopy. We then proceeded to obtain retrograde pyelogram x3 for volumetric estimation of the left renal pelvis. This averaged to 15cc. We then proceeded to connect the Jelmyto instillation apparatus to the ureteral catheter. The Jelmyto was injected without difficulty. The ureteral catheter was then removed to conclude the case.    The attending surgeon, Dr. Sallyanne Kuster, was present for the entirety of the case.    SPECIMENS:   None    ESTIMATED BLOOD LOSS: None    URINE OUTPUT:  None recorded    COMPLICATIONS:   None    DISPOSITION: The patient was successfully extubated and taken to the post-anesthesia care unit in stable condition accompanied by a member of the surgical team    POST-OPERATIVE PLAN:  Proceed with serial Jelmyto instillations as scheduled

## 2021-08-07 LAB — URINE CULTURE: Urine Culture Result: NO GROWTH

## 2021-08-10 ENCOUNTER — Other Ambulatory Visit (HOSPITAL_BASED_OUTPATIENT_CLINIC_OR_DEPARTMENT_OTHER): Payer: Self-pay | Admitting: Nurse Practitioner

## 2021-08-10 DIAGNOSIS — Z981 Arthrodesis status: Secondary | ICD-10-CM

## 2021-08-10 MED ORDER — PREGABALIN 75 MG OR CAPS
75.0000 mg | ORAL_CAPSULE | Freq: Every morning | ORAL | 0 refills | Status: DC
Start: 2021-08-10 — End: 2021-09-14

## 2021-08-10 NOTE — Telephone Encounter (Signed)
Spoke to patient via phone to let patient know that provider has sent refill directly to pharmacy. Patient can contact pharmacy to see if medication is ready for pickup.

## 2021-08-10 NOTE — Telephone Encounter (Signed)
Medication: pregabalin (LYRICA) 75 MG capsule                            Date of last refill: 07/14/2021    How many Pills/Tablets does the patient have left?  2 days left      Name of original prescribing physician:  Renato Gails    DOS:      Preferred Pharmacy for e-prescription [facility] insurance will not be approved at [facility]. [facility] and [facility] will approve):        Preferred clinic pick-up location or desires medication to be sent electronically to pharmacy if possible?        What order is the Preferred Pharmacy for this prescription in registration (1,2,3) ?  [id number] [place]  [address] [place] [address]  Phone:  [phone]:  [phone]        Best callback phone number and best time:    [phone]       Department Policy: We are only able to prescribe medications for patients that are current established patients (seen within 6 months, unless their notes state return in a year).       Please allow up to up to 72 hours for our office to process your medications request    Please verify any new medication allergies with patient, Please review chart to see notes from last refill request if patient has obtained previous refills of same medication.

## 2021-08-10 NOTE — Telephone Encounter (Signed)
Medication refilled via Electronic Prescribing of Controlled Substances directly to pharmacy.  Please let patient know refill sent directly to pharmacy.

## 2021-08-10 NOTE — Telephone Encounter (Signed)
DOS: 08/18/2020; Stage 1: Anterior lumbar interbody fusion, bone morphogenic protein, lumbar 5- sacral 1 - Dr. Lelon Huh  LOV: [id number] [place] [facility]; Back pain - Jason Coop    Medication refill sent to provider.    Do not send back to me. Please send to ortho RN pool.

## 2021-08-11 ENCOUNTER — Other Ambulatory Visit (HOSPITAL_BASED_OUTPATIENT_CLINIC_OR_DEPARTMENT_OTHER): Payer: Self-pay | Admitting: Urology

## 2021-08-11 DIAGNOSIS — Z419 Encounter for procedure for purposes other than remedying health state, unspecified: Secondary | ICD-10-CM

## 2021-08-12 ENCOUNTER — Ambulatory Visit (HOSPITAL_BASED_OUTPATIENT_CLINIC_OR_DEPARTMENT_OTHER): Payer: Medicare Other | Admitting: Anesthesiology

## 2021-08-12 ENCOUNTER — Other Ambulatory Visit: Payer: Self-pay

## 2021-08-12 ENCOUNTER — Encounter (HOSPITAL_BASED_OUTPATIENT_CLINIC_OR_DEPARTMENT_OTHER): Admission: RE | Disposition: A | Discharge status: Home Health | Payer: Self-pay | Attending: Urology

## 2021-08-12 ENCOUNTER — Ambulatory Visit (HOSPITAL_BASED_OUTPATIENT_CLINIC_OR_DEPARTMENT_OTHER): Payer: Medicare Other

## 2021-08-12 ENCOUNTER — Ambulatory Visit
Admission: RE | Admit: 2021-08-12 | Discharge: 2021-08-12 | Disposition: A | Discharge status: Home / Self Care | Payer: Medicare Other | Attending: Urology | Admitting: Urology

## 2021-08-12 DIAGNOSIS — Z6832 Body mass index (BMI) 32.0-32.9, adult: Secondary | ICD-10-CM | POA: Insufficient documentation

## 2021-08-12 DIAGNOSIS — C662 Malignant neoplasm of left ureter: Secondary | ICD-10-CM

## 2021-08-12 DIAGNOSIS — I10 Essential (primary) hypertension: Secondary | ICD-10-CM

## 2021-08-12 DIAGNOSIS — G8929 Other chronic pain: Secondary | ICD-10-CM

## 2021-08-12 DIAGNOSIS — C679 Malignant neoplasm of bladder, unspecified: Secondary | ICD-10-CM

## 2021-08-12 DIAGNOSIS — Z01818 Encounter for other preprocedural examination: Secondary | ICD-10-CM

## 2021-08-12 DIAGNOSIS — Z905 Acquired absence of kidney: Secondary | ICD-10-CM | POA: Insufficient documentation

## 2021-08-12 DIAGNOSIS — Z87442 Personal history of urinary calculi: Secondary | ICD-10-CM | POA: Insufficient documentation

## 2021-08-12 DIAGNOSIS — Z9889 Other specified postprocedural states: Secondary | ICD-10-CM | POA: Insufficient documentation

## 2021-08-12 DIAGNOSIS — Z419 Encounter for procedure for purposes other than remedying health state, unspecified: Secondary | ICD-10-CM

## 2021-08-12 DIAGNOSIS — Z9049 Acquired absence of other specified parts of digestive tract: Secondary | ICD-10-CM | POA: Insufficient documentation

## 2021-08-12 DIAGNOSIS — E669 Obesity, unspecified: Secondary | ICD-10-CM | POA: Insufficient documentation

## 2021-08-12 DIAGNOSIS — K219 Gastro-esophageal reflux disease without esophagitis: Secondary | ICD-10-CM

## 2021-08-12 DIAGNOSIS — Z880 Allergy status to penicillin: Secondary | ICD-10-CM | POA: Insufficient documentation

## 2021-08-12 DIAGNOSIS — C652 Malignant neoplasm of left renal pelvis: Secondary | ICD-10-CM

## 2021-08-12 DIAGNOSIS — Z882 Allergy status to sulfonamides status: Secondary | ICD-10-CM | POA: Insufficient documentation

## 2021-08-12 SURGERY — CYSTOSCOPY, WITH RETROGRADE PYELOGRAM
Anesthesia: General | Wound class: Class II (Clean Contaminated)

## 2021-08-12 MED ORDER — OXYCODONE HCL 5 MG OR TABS
5.0000 mg | ORAL_TABLET | Freq: Once | ORAL | Status: DC | PRN
Start: 2021-08-12 — End: 2021-08-12

## 2021-08-12 MED ORDER — GLYCOPYRROLATE 1 MG/5ML IJ SOLN
INTRAMUSCULAR | Status: DC | PRN
Start: 2021-08-12 — End: 2021-08-12
  Administered 2021-08-12: 10:00:00 .2 mg via INTRAVENOUS
  Administered 2021-08-12 (×2): .1 mg via INTRAVENOUS

## 2021-08-12 MED ORDER — ONDANSETRON HCL 4 MG/2ML IV SOLN
INTRAMUSCULAR | Status: DC | PRN
Start: 2021-08-12 — End: 2021-08-12
  Administered 2021-08-12 (×2): 4 mg via INTRAVENOUS

## 2021-08-12 MED ORDER — DEXAMETHASONE SODIUM PHOSPHATE 4 MG/ML IJ SOLN (CUSTOM)
INTRAMUSCULAR | Status: DC | PRN
Start: 2021-08-12 — End: 2021-08-12
  Administered 2021-08-12 (×2): 4 mg via INTRAVENOUS

## 2021-08-12 MED ORDER — IOTHALAMATE MEGLUMINE 60 % IJ SOLN
INTRAMUSCULAR | Status: AC
Start: 2021-08-12 — End: ?
  Filled 2021-08-12: qty 50

## 2021-08-12 MED ORDER — MIDAZOLAM HCL 2 MG/2ML IJ SOLN
INTRAMUSCULAR | Status: DC | PRN
Start: 2021-08-12 — End: 2021-08-12
  Administered 2021-08-12 (×3): 1 mg via INTRAVENOUS

## 2021-08-12 MED ORDER — MIDAZOLAM HCL 2 MG/2ML IJ SOLN
INTRAMUSCULAR | Status: AC
Start: 2021-08-12 — End: ?
  Filled 2021-08-12: qty 2

## 2021-08-12 MED ORDER — LACTATED RINGERS IV SOLN
INTRAVENOUS | Status: DC | PRN
Start: 2021-08-12 — End: 2021-08-12

## 2021-08-12 MED ORDER — FENTANYL CITRATE (PF) 250 MCG/5ML IJ SOLN
INTRAMUSCULAR | Status: DC | PRN
Start: 2021-08-12 — End: 2021-08-12
  Administered 2021-08-12 (×2): 50 ug via INTRAVENOUS

## 2021-08-12 MED ORDER — SODIUM CHLORIDE 0.9 % IV SOLN
INTRAVENOUS | Status: DC | PRN
Start: 2021-08-12 — End: 2021-08-12
  Administered 2021-08-12 (×2): 1000 mg via INTRAVENOUS

## 2021-08-12 MED ORDER — ACETAMINOPHEN 325 MG PO TABS
975.0000 mg | ORAL_TABLET | Freq: Once | ORAL | Status: AC
Start: 2021-08-12 — End: 2021-08-12
  Administered 2021-08-12 (×2): 975 mg via ORAL
  Filled 2021-08-12: qty 3

## 2021-08-12 MED ORDER — ONDANSETRON HCL 4 MG/2ML IV SOLN
4.0000 mg | Freq: Once | INTRAMUSCULAR | Status: DC | PRN
Start: 2021-08-12 — End: 2021-08-12

## 2021-08-12 MED ORDER — NALOXONE HCL 0.4 MG/ML IJ SOLN
0.1000 mg | INTRAMUSCULAR | Status: DC | PRN
Start: 2021-08-12 — End: 2021-08-12

## 2021-08-12 MED ORDER — MITOMYCIN (JELMYTO) 4 MG/ML PYELOCALYCEAL SOLUTION BLADDER INSTILLATION
60.0000 mg | Freq: Once | INTRAVESICAL | Status: AC
Start: 2021-08-12 — End: 2021-08-12
  Administered 2021-08-12: 11:00:00 60 mg via INTRAVESICAL
  Filled 2021-08-12: qty 15

## 2021-08-12 MED ORDER — LIDOCAINE HCL 2 % IJ SOLN WRAPPED RECORD
INTRAMUSCULAR | Status: DC | PRN
Start: 2021-08-12 — End: 2021-08-12
  Administered 2021-08-12 (×2): 90 mg via INTRAVENOUS

## 2021-08-12 MED ORDER — PROPOFOL 200 MG/20ML IV EMUL
INTRAVENOUS | Status: DC | PRN
Start: 2021-08-12 — End: 2021-08-12
  Administered 2021-08-12 (×2): 160 mg via INTRAVENOUS

## 2021-08-12 MED ORDER — FENTANYL CITRATE (PF) 50 MCG/ML IJ SOLN (WRAPPED RECORD) ~~LOC~~
50.0000 ug | INTRAMUSCULAR | Status: DC | PRN
Start: 2021-08-12 — End: 2021-08-12

## 2021-08-12 MED ORDER — FENTANYL CITRATE (PF) 50 MCG/ML IJ SOLN (WRAPPED RECORD) ~~LOC~~
25.0000 ug | INTRAMUSCULAR | Status: DC | PRN
Start: 2021-08-12 — End: 2021-08-12

## 2021-08-12 MED ORDER — FENTANYL CITRATE (PF) 100 MCG/2ML IJ SOLN
INTRAMUSCULAR | Status: AC
Start: 2021-08-12 — End: ?
  Filled 2021-08-12: qty 2

## 2021-08-12 MED ORDER — FAMOTIDINE (PF) 20 MG/2ML IV SOLN
INTRAVENOUS | Status: DC | PRN
Start: 2021-08-12 — End: 2021-08-12
  Administered 2021-08-12 (×2): 20 mg via INTRAVENOUS

## 2021-08-12 MED ORDER — LIDOCAINE HCL (PF) 1 % IJ SOLN
0.1000 mL | Freq: Once | INTRAMUSCULAR | Status: DC | PRN
Start: 2021-08-12 — End: 2021-08-12

## 2021-08-12 MED ORDER — LACTATED RINGERS IV SOLN
INTRAVENOUS | Status: DC
Start: 2021-08-12 — End: 2021-08-12

## 2021-08-12 MED ORDER — DIPHENHYDRAMINE HCL 50 MG/ML IJ SOLN
12.5000 mg | Freq: Once | INTRAMUSCULAR | Status: DC | PRN
Start: 2021-08-12 — End: 2021-08-12

## 2021-08-12 SURGICAL SUPPLY — 14 items
BAG CYSTO DRAIN SKYTRON TABLE (Misc Surgical Supply) ×2 IMPLANT
CATHETER URETERAL OPEN END 5FR X 70CM (Procedural wires/sheaths/catheters/balloons/dilators) ×2 IMPLANT
GLOVE BIOGEL PI INDICATOR SIZE 7 (Gloves/Gowns) ×2 IMPLANT
GLOVE SURGEON BIOGEL SIZE 7.5 (Gloves/Gowns) ×4
GUIDEWIRE SENSOR DUAL FLEX STRAIGHT TIP .035 X 150CM NITINOL (Procedural wires/sheaths/catheters/balloons/dilators) ×2 IMPLANT
SCRUB, SURGICAL, EXIDINE, 4%, CHG, 4OZ (Prep Solutions) ×2 IMPLANT
SLEEVE SCD KNEE MEDIUM (Patient Care Supply) ×2 IMPLANT
SOLUTION IRR BAG .9% N/S 3000ML (Non-Pharmacy Meds/Solutions) ×4 IMPLANT
SOLUTION IRR POUR BTL 0.9% NS 1000ML (Non-Pharmacy Meds/Solutions) IMPLANT
SURGICAL PACK CYSTO - SAME DAY (Procedure Packs/kits) ×2
SYRINGE HYPO LL 30CC (Needles/punch/cannula/biopsy) ×2 IMPLANT
TRAY FOLEY SURESTEP LUBRI-SIL I.C.16FR URIMETER, LF (Lines/Drains)
TUBING SUCTION MEDI-VAC 9/32" X 20' (Tubing/Suction) ×2 IMPLANT
UNDERPAD 30X36 (Patient Care Supply) ×2

## 2021-08-12 NOTE — Anesthesia Preprocedure Evaluation (Addendum)
ANESTHESIA PRE-OPERATIVE EVALUATION    Patient Information    Name: Stephen [name]    MRN: [id number]    DOB: 18-Mar-1951    Age: 70 year old    Sex: male  Procedure(s) with comments:  CYSTOSCOPY, WITH RETROGRADE PYELOGRAM WITH JELMYTO INSTILLATION - with jelmyto, will be done once/week for six weeks consecutive      Pre-op Vitals:   BP 117/75 (BP Location: Left arm, BP Patient Position: Sitting)    Pulse 50    Temp 36.1 C    Resp 17    Ht _0  (1.676 m)    Wt 92.2 kg (203 lb 3.2 oz)    SpO2 97%    BMI 32.80 kg/m    BMI kg/m2: 32.8 kg/m2    Primary language spoken:  English    ROS/Medical History:      History of Present Illness: Stephen [name] is a 70 year old male with congenital hydronephrosis at 70 yo, TURP and recurrent BNC previously managed with [facility], then SPT, now s/p DVIU with [facility] 12/07/2017. Hx of extensive lumbar-sacral spinal fusion.    Found to have UTUC planned for cystoscopy w/ retrograde pyelogram with jelmyto instillation.     General:  positive for Obesity,  able to climb flight of stairs/Exercise tolaerance >4 mets,  BMI 32.8 Cardiovascular:  no CAD/Angina/MI/CABG/Stents, Anti-platelet drugs: No,   no LV failure/CHF,   hypertension,  Walks to house and yard, stairs 2/2 back pain he is limited. Denies SOB or C/P.    Baseline HR 40-50's on monitor.   Anesthesia History:  negative anesthesia history ROS  no history of anesthetic complications,  no PONV,  chronic pain patient,  Previous Grade III needed bougie, glidescope used on most recent intubation without issue    Most recent anesthetic (for same procedure) - LMA #4 Pulmonary:   negative pulmonary ROS  no asthma,  no COPD,  no sleep apnea,     Neuro/Psych:   negative for TIA/CVA,  no seizures,  psychiatric history,  Multiple spinal surgeries Hematology/Oncology:   hematologic/lymphatic negative      GI/Hepatic:  GERD,  no liver disease,   Infectious Disease:  negative for infectious disease     Renal:  H/o urethral stricture and TURP in the  past. Hx of prior nephrectomy 2/2 congenital hydronephrosis.     BLE edematous and erythemic.  Endocrine/Other:  no diabetes,  arthritis,   back pain,  Hx of lumbar-sacral spinal fusion; multiple back surgeries.   Pregnancy History:   Pediatrics:         Pre Anesthesia Testing (PCC/CPC) notes/comments:                 Physical Exam    Airway:      Comment: Beard Inter-inciser distance 3-4 cm    Mallampati: III  Neck ROM: full  TM distance: 5-6 cm  Short thick neck: No          Cardiovascular:  - cardiovascular exam normal   Comment: Bradycardia         Pulmonary:  - pulmonary exam normal           Neuro/Neck/Skeletal/Skin:  - Neck/Neuro/Skeletal/Skin exam normal          Dental:    Comment: Multiple cracked and broken teeth, enamel fractures, poor dentition    Abdominal:      General: obesity     Additional Clinical Notes:  Last  OSA (STOP BANG) Score:  No data recorded    Last OSA  (STOP) Score for   Has a physician diagnosed you with sleep apnea?: No  Do you use a CPAP at home?: No  Do you snore loudly (loud enough to be heard through a closed door)?: 0  Do you often feel tired, fatigued or sleepy during the day?: 1  Has anyone observed that you stop breathing while you are sleeping?: 0  Have you ever been treated for high blood pressure?: 1  OSA total score (A score of 2 or more is high risk. Offer patient sleep study.): 2                   Past Medical History:   Diagnosis Date    Chronic back pain     Congenital hydronephrosis     Gout     Headache     Hematuria     Kidney disease     Kidney stones     Major depressive disorder, single episode     Polyarthropathy or polyarthritis of multiple sites     Retinal detachment     Urethral stricture      Past Surgical History:   Procedure Laterality Date    CT INSERTION OF SUPRAPUBIC CATH  09/25/2015    NEPHRECTOMY Right 1995    APPENDECTOMY      COLONOSCOPY      CYSTOSCOPY      CYSTOSCOPY W/ LASER LITHOTRIPSY      OTHER SURGICAL HISTORY       Interstim 01/29/2011    SPINE SURGERY  09/21    Lumbar-sacral fusion    TRANSURETHRAL RESECTION OF PROSTATE       Social History     Socioeconomic History    Marital status: Single   Tobacco Use    Smoking status: Never Smoker    Smokeless tobacco: Never Used   Substance and Sexual Activity    Alcohol use: No    Drug use: Not Currently    Sexual activity: Not Currently     Partners: Female   Other Topics Concern    Military Service No    Blood Transfusions Yes    Caffeine Concern No    Occupational Exposure No    Hobby Hazards No    Sleep Concern Yes     Comment: due to my lower back discomfort and leg discomfort    Stress Concern Yes     Comment: Health/ housing/ financial    Weight Concern No    Special Diet No    Back Care Yes     Comment: Am very careful with any activity    Exercises Regularly Yes    Seat Belt Use Yes    Performs Self-Exams Yes    Bike Helmet Use No     Alcohol Use: Not on file       Current Facility-Administered Medications   Medication Dose Route Frequency Provider Last Rate Last Admin    acetaminophen (TYLENOL) tablet 975 mg  975 mg Oral Once Carlota Raspberry, MD        lactated ringers infusion   IntraVENOUS Continuous Carlota Raspberry, MD        lidocaine (XYLOCAINE) 1% PF injection 0.1 mL  0.1 mL IntraDERMAL Once PRN Carlota Raspberry, MD        mitomycin (JELMYTO) 4 mg/mL pyelocalyceal solution bladder instillation 60 mg  60 mg IntraVESICAL Once Carlota Raspberry, MD  Allergies   Allergen Reactions    Amoxicillin Rash    Sulfa Drugs Unspecified       Labs and Other Data  Lab Results   Component Value Date    NA 139 04/28/2021    K 4.3 04/28/2021    CL 102 04/28/2021    BICARB 24 04/28/2021    BUN 21 04/28/2021    CREAT 1.25 (H) 04/28/2021    GLU 96 04/28/2021    [place] 10.0 04/28/2021     Lab Results   Component Value Date    AST 27 08/18/2020    ALT 9 08/18/2020    ALK 64 08/18/2020    TP 6.8 08/18/2020    ALB 4.7 08/18/2020    TBILI 1.00 08/18/2020      Lab Results   Component Value Date    WBC 5.4 07/30/2021    RBC 4.71 07/30/2021    HGB 14.5 07/30/2021    HCT 42.4 07/30/2021    MCV 90.0 07/30/2021    MCHC 34.2 07/30/2021    RDW 13.8 07/30/2021    PLT 180 07/30/2021    MPV 10.9 07/30/2021    SEG 46 07/30/2021    LYMPHS 41 07/30/2021    MONOS 10 07/30/2021    EOS 3 07/30/2021    BASOS 1 07/30/2021     Lab Results   Component Value Date    INR 1.2 08/18/2020    PTT 28 08/18/2020     Lab Results   Component Value Date    ARTPH 7.42 08/18/2020    ARTPO2 218 (H) 08/18/2020    ARTPCO2 35 (L) 08/18/2020       Anesthesia Plan:  Risks and Benefits of Anesthesia  I have personally performed an appropriate pre-anesthesia physical exam of the patient (including heart, lungs, and airway) prior to the anesthetic and reviewed the pertinent medical history, drug and allergy history, laboratory and imaging studies and consultations.   I have determined that the patient has had adequate assessment and testing.  I have validated the documentation of these elements of the patient exam and/or have made necessary changes to reflect my own observations during my pre-anesthesia exam.  Anesthetic techniques, invasive monitors, anesthetic drugs for induction, maintenance and post-operative analgesia, risks and alternatives have been explained to the patient and/or patient's representatives.    I have prescribed the anesthetic plan:         Planned anesthesia method: General         ASA 3 (Severe systemic disease)     Potential anesthesia problems identified and risks including but not limited to the following were discussed with patient and/or patient's representative: Adverse or allergic drug reaction, Ocular injury, Dental injury or sore throat, Injury to brain, heart and other organs and Recall    No Beta Blocker Indicated: Does not meet criteria and Patient not on beta blockers    Planned monitoring method: Routine monitoring    Informed Consent:  Anesthetic plan and risks  discussed with Patient.    Plan discussed with OR Nurse and Surgeon.

## 2021-08-12 NOTE — Discharge Instructions (Signed)
Please call the office if you are unable to void or for fevers >100.4,

## 2021-08-12 NOTE — H&P (Signed)
UROLOGY HISTORY AND PHYSICAL    Chief complaint:  UTUC.    History of Present Illness:     Stephen [name] is a 70 year old male with UTUC.     Past Medical and Surgical History:  Past Medical History:   Diagnosis Date    Chronic back pain     Congenital hydronephrosis     Gout     Headache     Hematuria     Kidney disease     Kidney stones     Major depressive disorder, single episode     Polyarthropathy or polyarthritis of multiple sites     Retinal detachment     Urethral stricture      Past Surgical History:   Procedure Laterality Date    CT INSERTION OF SUPRAPUBIC CATH  09/25/2015    NEPHRECTOMY Right 1995    APPENDECTOMY      COLONOSCOPY      CYSTOSCOPY      CYSTOSCOPY W/ LASER LITHOTRIPSY      OTHER SURGICAL HISTORY      Interstim 01/29/2011    SPINE SURGERY  09/21    Lumbar-sacral fusion    TRANSURETHRAL RESECTION OF PROSTATE         Allergies:  Allergies   Allergen Reactions    Amoxicillin Rash    Sulfa Drugs Unspecified       Medications:  Current Facility-Administered Medications   Medication    lactated ringers infusion    lidocaine (XYLOCAINE) 1% PF injection 0.1 mL    mitomycin (JELMYTO) 4 mg/mL pyelocalyceal solution bladder instillation 60 mg       Social History:  Social History     Socioeconomic History    Marital status: Single   Tobacco Use    Smoking status: Never Smoker    Smokeless tobacco: Never Used   Substance and Sexual Activity    Alcohol use: No    Drug use: Not Currently    Sexual activity: Not Currently     Partners: Female   Social Activities of Daily Living Present    Military Service No    Blood Transfusions Yes    Caffeine Concern No    Occupational Exposure No    Hobby Hazards No    Sleep Concern Yes     Comment: due to my lower back discomfort and leg discomfort    Stress Concern Yes     Comment: Health/ housing/ financial    Weight Concern No    Special Diet No    Back Care Yes     Comment: Am very careful with any activity     Exercises Regularly Yes    Seat Belt Use Yes    Performs Self-Exams Yes    Bike Helmet Use No       Family History:  Family History   Adopted: Yes   Family history unknown: Yes       Review of Systems:  Constitutional: denies fatigue, night sweats, weight loss, fever.  Eyes: denies changes in vision  Ears, Nose, Mouth, Throat: denies difficulty swallowing, sore throat, nosebleeds, rhinorrhea.  CV: denies palpitations, chest pain, lower extremity edema, or claudication.  Resp: denies productive cough, shortness of breath, wheezing, dyspnea on exertion.  GI: denies heartburn, hematemesis, bleeding from rectum, melena, jaundice, constipation or diarrhea.  GU: Per HPI  Musculoskeletal: denies joint stiffness, joint swelling, joint pain, joint redness, back pain, or muscle pain.  Neuro: denies confusion, loss of consciousness, headaches, paralysis/weakness,  numbness or tingling.  Endo: denies symptoms of diabetes  Heme/Lymphatic: denies abnormal bleeding, bruising, or swollen lymph nodes.    Physical Exam:  BP 117/75 (BP Location: Left arm, BP Patient Position: Sitting)    Pulse 50    Temp 97 F (36.1 C)    Resp 17    Ht _0  (1.676 m)    Wt 92.2 kg (203 lb 3.2 oz)    SpO2 97%    BMI 32.80 kg/m   GEN: NAD, awake, alert/oriented   HEENT: EOMI, atraumatic  CARDIO: Regular rate and rhythm, warm extremities  PULM: Non-labored breathing on room air, speaking in full sentences  GI: Abdomen soft, non-distended, non-tender  GU: Deferred  SKIN: warm and dry  NEURO: No gross neurological deficits      Labs and Other Data:  No results found for: NA, K, CL, BICARB, BUN, CREAT, GLU, [place]  No results found for: WBC, HGB, HCT, PLT, SEG, BAND, LYMPHS, MONOS, EOS, NRBC  No results found for: AST, ALT, ALK, TBILI, DBILI, TP, ALB  No results found for: INR, PTT      Imaging:  X-Ray Fluoroscopy Up To 1 Hr - OR    Result Date: 08/05/2021  Narrative: This exam was to provide Fluoroscopic Guidance.  No Radiologist interpretation will be  given.      Assessment and Plan:  70 year old male with UTUC.     - Proceed to OR for planned procedure    Discussed with attending Dr. Earl Lites, MD  Urology PGY2    08/12/21, 9:46 AM

## 2021-08-12 NOTE — Anesthesia Postprocedure Evaluation (Signed)
Anesthesia Post Note    Patient: Stephen [name]    Procedure(s) Performed: Procedure(s) with comments:  CYSTOSCOPY, WITH RETROGRADE JELMYTO INSTILLATION - with jelmyto, will be done once/week for six weeks consecutive      Final anesthesia type: General    Patient location: PACU    Post anesthesia pain: adequate analgesia    Mental status: awake, alert  and oriented    Airway Patent: Yes    Last Vitals:   Vitals Value Taken Time   BP 117/58 08/12/21 1100   Temp 36.1 C 08/12/21 1046   Pulse 54 08/12/21 1101   Resp 12 08/12/21 1101   SpO2 97 % 08/12/21 1100   Vitals shown include unvalidated device data.     Post vital signs: stable    Hydration: adequate    N/V:no    Anesthetic complications: no    Plan of care per primary team.

## 2021-08-12 NOTE — Op Note (Signed)
UROLOGY OPERATIVE REPORT    PATIENT NAME: Stephen [name]    MRN: [id number]    DATE OF OPERATION: 08/12/21    CASE ID: [id number]    ATTENDING SURGEON: Carlota Raspberry, MD    ASSISTANT SURGEON(S):   1. Alinda Dooms, MD    PREOPERATIVE DIGNOSIS: UTUC    POSTOPERATIVE DIAGNOSIS: same    PROCEDURE(s):   1. Cystoscopy with retrograde instillation of Jelmyto    ANESTHESIA: General    ANTIBIOTICS: Vancomycin     OPERATIVE FINDINGS:  1. Early dysplastic/papillary changes around L UO c/f stent edema vs recurrence  2. 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 He is now s/p ureteroscopy with laser fulguration of lesions 05/05/21 and 06/16/21. He has elected to proceed with mitomycin injections for disease control. He presents today for second instillation.    PROCEDURE IN DETAIL:  The patient was met and examined in the preoperative area. The indications, risks, benefits, and alternatives were explained to the patient.  All questions were answered and informed consent was obtained. The patient was then brought to the operating room and positioned supine on the surgical bed. Sequential compression devices were applied to bilateral lower extremities. Prior to induction of anesthesia, OR brief was performed confirming the correct patient, patient information, procedure and equipment. Once anesthesia was induced, the patient was placed in a lithotomy position, prepped and draped in the usual sterile fashion, and antibiotics were administered.    After a timeout was performed, we began by entering the bladder with a 21Fr cystoscope. We noted early dysplastic/papillary changes around left UO concerning for stent edema versus recurrence. We then cannulated the left UO with a straight sensor wire which was advanced to the level of the renal pelvis confirmed under fluorsoscopy. The Jelmyto  injection ureteral catheter was placed over the wire to the level of the L UPJ under fluoroscopy. We then proceeded to connect the Jelmyto instillation apparatus to the ureteral catheter. The Jelmyto was injected without difficulty. The ureteral catheter was then removed to conclude the case.    The attending surgeon, Dr. Sallyanne Kuster, was present for the entirety of the case.    SPECIMENS:   None    ESTIMATED BLOOD LOSS:   None    URINE OUTPUT:  None recorded    COMPLICATIONS:   None    DISPOSITION: The patient was successfully extubated and taken to the post-anesthesia care unit in stable condition accompanied by a member of the surgical team    POST-OPERATIVE PLAN:  Proceed with Jelmyto instillation next week  Urine culture prior  Add bladder biopsy and TURBT

## 2021-08-12 NOTE — Addendum Note (Signed)
Addendum  created 08/12/21 1103 by Lanier Prude, MD    Clinical Note Signed

## 2021-08-13 ENCOUNTER — Other Ambulatory Visit: Payer: Self-pay

## 2021-08-14 LAB — URINE CULTURE: Urine Culture Result: NO GROWTH

## 2021-08-18 ENCOUNTER — Other Ambulatory Visit (HOSPITAL_BASED_OUTPATIENT_CLINIC_OR_DEPARTMENT_OTHER): Payer: Self-pay | Admitting: Urology

## 2021-08-18 DIAGNOSIS — Z419 Encounter for procedure for purposes other than remedying health state, unspecified: Secondary | ICD-10-CM

## 2021-08-19 ENCOUNTER — Ambulatory Visit (HOSPITAL_BASED_OUTPATIENT_CLINIC_OR_DEPARTMENT_OTHER): Payer: Medicare Other | Admitting: Anesthesiology

## 2021-08-19 ENCOUNTER — Ambulatory Visit
Admission: RE | Admit: 2021-08-19 | Discharge: 2021-08-19 | Disposition: A | Discharge status: Home / Self Care | Payer: Medicare Other | Attending: Urology | Admitting: Urology

## 2021-08-19 ENCOUNTER — Ambulatory Visit (HOSPITAL_BASED_OUTPATIENT_CLINIC_OR_DEPARTMENT_OTHER): Payer: Medicare Other

## 2021-08-19 ENCOUNTER — Encounter (HOSPITAL_BASED_OUTPATIENT_CLINIC_OR_DEPARTMENT_OTHER): Admission: RE | Disposition: A | Discharge status: Home Health | Payer: Self-pay | Attending: Urology

## 2021-08-19 DIAGNOSIS — Z981 Arthrodesis status: Secondary | ICD-10-CM

## 2021-08-19 DIAGNOSIS — Z419 Encounter for procedure for purposes other than remedying health state, unspecified: Secondary | ICD-10-CM

## 2021-08-19 DIAGNOSIS — Z8554 Personal history of malignant neoplasm of ureter: Secondary | ICD-10-CM | POA: Insufficient documentation

## 2021-08-19 DIAGNOSIS — C679 Malignant neoplasm of bladder, unspecified: Secondary | ICD-10-CM | POA: Insufficient documentation

## 2021-08-19 DIAGNOSIS — Z905 Acquired absence of kidney: Secondary | ICD-10-CM | POA: Insufficient documentation

## 2021-08-19 DIAGNOSIS — Z881 Allergy status to other antibiotic agents status: Secondary | ICD-10-CM | POA: Insufficient documentation

## 2021-08-19 DIAGNOSIS — Z85528 Personal history of other malignant neoplasm of kidney: Secondary | ICD-10-CM | POA: Insufficient documentation

## 2021-08-19 DIAGNOSIS — Z8551 Personal history of malignant neoplasm of bladder: Secondary | ICD-10-CM | POA: Insufficient documentation

## 2021-08-19 DIAGNOSIS — Z882 Allergy status to sulfonamides status: Secondary | ICD-10-CM | POA: Insufficient documentation

## 2021-08-19 DIAGNOSIS — C672 Malignant neoplasm of lateral wall of bladder: Secondary | ICD-10-CM

## 2021-08-19 SURGERY — CYSTOSCOPY, WITH RETROGRADE PYELOGRAM
Anesthesia: General | Wound class: Class II (Clean Contaminated)

## 2021-08-19 MED ORDER — AMISULPRIDE (ANTIEMETIC) 10 MG/4ML IV SOLN
10.0000 mg | Freq: Once | INTRAVENOUS | Status: DC
Start: 2021-08-19 — End: 2021-08-19

## 2021-08-19 MED ORDER — ACETAMINOPHEN 325 MG PO TABS
650.0000 mg | ORAL_TABLET | Freq: Four times a day (QID) | ORAL | 0 refills | Status: DC | PRN
Start: 2021-08-19 — End: 2021-08-26

## 2021-08-19 MED ORDER — NALOXONE HCL 0.4 MG/ML IJ SOLN
0.1000 mg | INTRAMUSCULAR | Status: DC | PRN
Start: 2021-08-19 — End: 2021-08-19

## 2021-08-19 MED ORDER — LIDOCAINE INFUSION FOR PAIN
INTRAVENOUS | Status: DC | PRN
Start: 2021-08-19 — End: 2021-08-19
  Administered 2021-08-19 (×2): 30 mg via INTRAVENOUS

## 2021-08-19 MED ORDER — ONDANSETRON HCL 4 MG/2ML IV SOLN
4.0000 mg | Freq: Once | INTRAMUSCULAR | Status: DC | PRN
Start: 2021-08-19 — End: 2021-08-19

## 2021-08-19 MED ORDER — LIDOCAINE HCL (PF) 1 % IJ SOLN
0.1000 mL | Freq: Once | INTRAMUSCULAR | Status: DC | PRN
Start: 2021-08-19 — End: 2021-08-19

## 2021-08-19 MED ORDER — ACETAMINOPHEN 325 MG PO TABS
975.0000 mg | ORAL_TABLET | Freq: Once | ORAL | Status: AC
Start: 2021-08-19 — End: 2021-08-19
  Administered 2021-08-19 (×2): 975 mg via ORAL

## 2021-08-19 MED ORDER — OXYCODONE HCL 5 MG OR TABS
5.0000 mg | ORAL_TABLET | Freq: Once | ORAL | Status: DC | PRN
Start: 2021-08-19 — End: 2021-08-19
  Administered 2021-08-19 (×2): 5 mg via ORAL
  Filled 2021-08-19: qty 1

## 2021-08-19 MED ORDER — CEFAZOLIN SODIUM 1 GM IJ SOLR
INTRAMUSCULAR | Status: DC | PRN
Start: 2021-08-19 — End: 2021-08-19
  Administered 2021-08-19 (×2): 2000 mg via INTRAVENOUS

## 2021-08-19 MED ORDER — MITOMYCIN (JELMYTO) 4 MG/ML PYELOCALYCEAL SOLUTION BLADDER INSTILLATION
60.0000 mg | Freq: Once | INTRAVESICAL | Status: AC
Start: 2021-08-19 — End: 2021-08-19
  Administered 2021-08-19 (×2): 60 mg via INTRAVESICAL
  Filled 2021-08-19: qty 15

## 2021-08-19 MED ORDER — SODIUM CHLORIDE 0.9 % IV SOLN
12.5000 mg | Freq: Four times a day (QID) | INTRAVENOUS | Status: DC | PRN
Start: 2021-08-19 — End: 2021-08-19

## 2021-08-19 MED ORDER — DEXAMETHASONE SODIUM PHOSPHATE 4 MG/ML IJ SOLN (CUSTOM)
INTRAMUSCULAR | Status: DC | PRN
Start: 2021-08-19 — End: 2021-08-19
  Administered 2021-08-19 (×2): 6 mg via INTRAVENOUS

## 2021-08-19 MED ORDER — FENTANYL CITRATE (PF) 50 MCG/ML IJ SOLN (WRAPPED RECORD) ~~LOC~~
25.0000 ug | INTRAMUSCULAR | Status: DC | PRN
Start: 2021-08-19 — End: 2021-08-19

## 2021-08-19 MED ORDER — SENNA 8.6 MG OR TABS
8.6000 mg | ORAL_TABLET | Freq: Every day | ORAL | 0 refills | Status: DC
Start: 2021-08-19 — End: 2022-12-01

## 2021-08-19 MED ORDER — ONDANSETRON HCL 4 MG/2ML IV SOLN
INTRAMUSCULAR | Status: DC | PRN
Start: 2021-08-19 — End: 2021-08-19
  Administered 2021-08-19 (×2): 4 mg via INTRAVENOUS

## 2021-08-19 MED ORDER — MENTHOL 3 MG MT LOZG
1.0000 | LOZENGE | OROMUCOSAL | Status: DC | PRN
Start: 2021-08-19 — End: 2021-08-19

## 2021-08-19 MED ORDER — FENTANYL CITRATE (PF) 50 MCG/ML IJ SOLN (WRAPPED RECORD) ~~LOC~~
50.0000 ug | INTRAMUSCULAR | Status: DC | PRN
Start: 2021-08-19 — End: 2021-08-19

## 2021-08-19 MED ORDER — LACTATED RINGERS IV SOLN
INTRAVENOUS | Status: DC | PRN
Start: 2021-08-19 — End: 2021-08-19

## 2021-08-19 MED ORDER — HYDROMORPHONE HCL 1 MG/ML IJ SOLN
0.5000 mg | INTRAMUSCULAR | Status: DC | PRN
Start: 2021-08-19 — End: 2021-08-19

## 2021-08-19 MED ORDER — LACTATED RINGERS IV SOLN
INTRAVENOUS | Status: DC
Start: 2021-08-19 — End: 2021-08-19

## 2021-08-19 MED ORDER — PROPOFOL 200 MG/20ML IV EMUL
INTRAVENOUS | Status: DC | PRN
Start: 2021-08-19 — End: 2021-08-19
  Administered 2021-08-19 (×2): 150 mg via INTRAVENOUS

## 2021-08-19 SURGICAL SUPPLY — 13 items
BAG CYSTO DRAIN SKYTRON TABLE (Misc Surgical Supply) ×2 IMPLANT
CATHETER URETERAL OPEN END 5FR X 70CM (Procedural wires/sheaths/catheters/balloons/dilators) ×2 IMPLANT
GLOVE SURGEON BIOGEL SIZE 7.5 (Gloves/Gowns) ×2 IMPLANT
GUIDEWIRE SENSOR DUAL FLEX STRAIGHT TIP .035 X 150CM NITINOL (Procedural wires/sheaths/catheters/balloons/dilators) ×2 IMPLANT
SCRUB, SURGICAL, EXIDINE, 4%, CHG, 4OZ (Prep Solutions) ×2 IMPLANT
SLEEVE SCD KNEE MEDIUM (Patient Care Supply) ×2 IMPLANT
SOLUTION IRR BAG .9% N/S 3000ML (Non-Pharmacy Meds/Solutions) ×2 IMPLANT
SOLUTION IRR POUR BTL 0.9% NS 1000ML (Non-Pharmacy Meds/Solutions) IMPLANT
SURGICAL PACK CYSTO - SAME DAY (Procedure Packs/kits) ×2 IMPLANT
SYRINGE HYPO LL 30CC (Needles/punch/cannula/biopsy) ×2 IMPLANT
TRAY FOLEY SURESTEP LUBRI-SIL I.C.16FR URIMETER, LF (Lines/Drains) IMPLANT
TUBING SUCTION MEDI-VAC 9/32" X 20' (Tubing/Suction) ×2 IMPLANT
UNDERPAD 30X36 (Patient Care Supply) ×2 IMPLANT

## 2021-08-19 NOTE — Discharge Instructions (Signed)
[facility] Department of Urology      Postoperative Instructions      Diagnosis and Reason for Admission    Date of Procedure: 08/19/21     You were admitted to the hospital for the following reason(s):  upper tract urothelial cancer    Your full diagnosis list is located on this After Visit Summary in the Hospital Problems section.    What [facility] Hospital Stay    The main tests and treatments done for you during this hospitalization were:    Gel Mito instillation      Instructions for After Discharge    Diet:  Your diet at home should be a regular diet.    Activity:  You may resume light normal daily activities such as walking.  Specific activity restrictions:    Do not drive while taking narcotic pain medications.  Do not drive with a urinary catheter in place.  Do not work with heavy or complex machinery while taking narcotic pain medications.  Do not lift more than 10 pounds, perform vigorous or strenuous activity for 6 weeks.    Wound/Tube Care (if you have an incision or a tube):  Wound or tube care instructions:  Keep area clean and dry.  Do not scrub any incision sites.  Showering is OK and pat dry the surgical site, with care as instructed.  Do not submerge area under water until fully healed and at least 3 weeks.    If you previously had a drain, it is not uncommon for the prior drain site to drain fluid for 2-3 days until the skin heals.  Please replace with new gauze and medical tape daily after showering and as needed. You may be given gauze at the time of discharge and/or you can also obtain surgical gauze at any pharmacy.     Your medication list is located on this After Visit Summary in the Current Discharge Medication List section.  Your nurse will review this information with you before you leave the hospital.  New Medications or Changes:  Tylenole    It is very important for you to keep a current medication list with you in order to assist your doctors with your medical care.  Bring  this After Visit Summary with you to your follow up appointments.    Follow Up:  Our schedulers will call you to set up your next appointment. If you do not hear from [name] in 1-2 business days, please call our clinic to discuss  Plan to follow up with Dr. Sallyanne Kuster    Reasons to Contact a Doctor Urgently    Call 911 or return to the hospital immediately if:  Shortness of breath or difficulty breathing.  Chest pains or palpitations.    You should contact either your primary care physician or your hospital physician for any of the following reasons:   Increased or uncontrolled pain.  Severe abdominal pain.  Nausea and vomiting.  Severe abdominal bloating.  Redness or swelling at the surgical (or wound) site.  Discharge or leakage from the surgical (or wound) site.  Fevers or chills.  Bleeding from surgical (or wound) site.    How to contact us:  Urology Contact Information:     If you have any questions about your hospital care, your medications, or if you have new or concerning symptoms soon after going home, and you need to contact your hospital physician, your hospital physician can be contacted in the following manner:  Business Hours (Monday - Friday; 8:00 AM - 4:30 PM):  [facility] Office phone at [phone]  [facility] Office phone at [phone]  [facility] [place] phone at [phone]    After-Hours, Weekends/Holidays:  [place], call [phone] and ask them to page the on-call Urologist. If it is not an emergency, then wait for business hours and call one of the numbers above.    If your clinic appointment has not been scheduled before you leave the hospital and you do not receive a call within 2 business days, call the clinic scheduler for your urologist's clinic to schedule the appointment.        What Needs to Happen Next After Discharge -- Appointments and Follow Up    Any appointments already scheduled at [facility] will be listed in the Future  Appointments section at the top of this After Visit Summary.  Any appointments that have been requested, but have not yet been scheduled, will be listed below that under Post Discharge Referrals.      Additional Information for You from your Case Manager and/or Social Worker (if applicable)        Additional Information for You from your Inpatient Therapists (if applicable)        Additional Information for You from your Discharging Nurse (if applicable)        Handouts Given to You (if applicable)

## 2021-08-19 NOTE — Anesthesia Preprocedure Evaluation (Addendum)
ANESTHESIA PRE-OPERATIVE EVALUATION    Patient Information    Name: Stephen [name]    MRN: [id number]    DOB: Jan 27, 1951    Age: 70 year old    Sex: male  Procedure(s) with comments:  CYSTOSCOPY, [place] - with jelmyto, will be done once/week for six weeks consecutive      Pre-op Vitals:   There were no vitals taken for this visit.        Primary language spoken:  English    ROS/Medical History:      History of Present Illness: CYSTOSCOPY, WITH RETROGRADE PYELOGRAM WITH JELMYTO INSTILLATION    General:  negative for General ROS  able to climb flight of stairs/Exercise tolaerance >4 mets,   Cardiovascular:  negative cardio ROS     Anesthesia History:  negative anesthesia history ROS  Previous Grade III needed bougie, glidescope used on most recent intubation without issue Pulmonary:   negative pulmonary ROS     Neuro/Psych:   psychiatric history,   Hematology/Oncology:   history of cancer,      GI/Hepatic:  negative GI/hepatic ROS Infectious Disease:     Renal:  chronic renal disease,  Kidney Stones Endocrine/Other:  arthritis,   Gout   Pregnancy History:   Pediatrics:         Pre Anesthesia Testing (PCC/CPC) notes/comments:                 Physical Exam    Airway:    Inter-inciser distance > 4 cm  Prognanth Able    Mallampati: II  Neck ROM: full  TM distance: 5-6 cm  Short thick neck: No          Cardiovascular:  - cardiovascular exam normal         Pulmonary:  - pulmonary exam normal           Neuro/Neck/Skeletal/Skin:      Dental:    Comment: Multiple broken, missing and rotted teeth.    Abdominal:              Last  OSA (STOP BANG) Score:  No data recorded    Last OSA  (STOP) Score for   Has a physician diagnosed you with sleep apnea?: No  Do you use a CPAP at home?: No  Do you snore loudly (loud enough to be heard through a closed door)?: 0  Do you often feel tired, fatigued or sleepy during the day?: 1  Has anyone observed that you stop breathing while you are  sleeping?: 0  Have you ever been treated for high blood pressure?: 1  OSA total score (A score of 2 or more is high risk. Offer patient sleep study.): 2                   Past Medical History:   Diagnosis Date    Chronic back pain     Congenital hydronephrosis     Gout     Headache     Hematuria     Kidney disease     Kidney stones     Major depressive disorder, single episode     Polyarthropathy or polyarthritis of multiple sites     Retinal detachment     Urethral stricture      Past Surgical History:   Procedure Laterality Date    CT INSERTION OF SUPRAPUBIC CATH  09/25/2015    NEPHRECTOMY Right 1995    APPENDECTOMY  COLONOSCOPY      CYSTOSCOPY      CYSTOSCOPY W/ LASER LITHOTRIPSY      OTHER SURGICAL HISTORY      Interstim 01/29/2011    SPINE SURGERY  09/21    Lumbar-sacral fusion    TRANSURETHRAL RESECTION OF PROSTATE       Social History     Socioeconomic History    Marital status: Single   Tobacco Use    Smoking status: Never Smoker    Smokeless tobacco: Never Used   Substance and Sexual Activity    Alcohol use: No    Drug use: Not Currently    Sexual activity: Not Currently     Partners: Female   Other Topics Concern    Military Service No    Blood Transfusions Yes    Caffeine Concern No    Occupational Exposure No    Hobby Hazards No    Sleep Concern Yes     Comment: due to my lower back discomfort and leg discomfort    Stress Concern Yes     Comment: Health/ housing/ financial    Weight Concern No    Special Diet No    Back Care Yes     Comment: Am very careful with any activity    Exercises Regularly Yes    Seat Belt Use Yes    Performs Self-Exams Yes    Bike Helmet Use No     Alcohol Use: Not on file       Current Facility-Administered Medications   Medication Dose Route Frequency Provider Last Rate Last Admin    acetaminophen (TYLENOL) tablet 975 mg  975 mg Oral Once Carlota Raspberry, MD        lactated ringers infusion   IntraVENOUS Continuous Carlota Raspberry, MD        lidocaine (XYLOCAINE) 1% PF injection 0.1 mL  0.1 mL IntraDERMAL Once PRN Carlota Raspberry, MD         Allergies   Allergen Reactions    Amoxicillin Rash    Sulfa Drugs Unspecified       Labs and Other Data  Lab Results   Component Value Date    NA 139 04/28/2021    K 4.3 04/28/2021    CL 102 04/28/2021    BICARB 24 04/28/2021    BUN 21 04/28/2021    CREAT 1.25 (H) 04/28/2021    GLU 96 04/28/2021    [place] 10.0 04/28/2021     Lab Results   Component Value Date    AST 27 08/18/2020    ALT 9 08/18/2020    ALK 64 08/18/2020    TP 6.8 08/18/2020    ALB 4.7 08/18/2020    TBILI 1.00 08/18/2020     Lab Results   Component Value Date    WBC 5.4 07/30/2021    RBC 4.71 07/30/2021    HGB 14.5 07/30/2021    HCT 42.4 07/30/2021    MCV 90.0 07/30/2021    MCHC 34.2 07/30/2021    RDW 13.8 07/30/2021    PLT 180 07/30/2021    MPV 10.9 07/30/2021    SEG 46 07/30/2021    LYMPHS 41 07/30/2021    MONOS 10 07/30/2021    EOS 3 07/30/2021    BASOS 1 07/30/2021     Lab Results   Component Value Date    INR 1.2 08/18/2020    PTT 28 08/18/2020     Lab Results   Component Value Date  ARTPH 7.42 08/18/2020    ARTPO2 218 (H) 08/18/2020    ARTPCO2 35 (L) 08/18/2020       Anesthesia Plan:  Risks and Benefits of Anesthesia  I have personally performed an appropriate pre-anesthesia physical exam of the patient (including heart, lungs, and airway) prior to the anesthetic and reviewed the pertinent medical history, drug and allergy history, laboratory and imaging studies and consultations.   I have determined that the patient has had adequate assessment and testing.  I have validated the documentation of these elements of the patient exam and/or have made necessary changes to reflect my own observations during my pre-anesthesia exam.  Anesthetic techniques, invasive monitors, anesthetic drugs for induction, maintenance and post-operative analgesia, risks and alternatives have been explained to the patient and/or patient's  representatives.    I have prescribed the anesthetic plan:         Planned anesthesia method: General         ASA 3 (Severe systemic disease)     Potential anesthesia problems identified and risks including but not limited to the following were discussed with patient and/or patient's representative: Adverse or allergic drug reaction and Dental injury or sore throat    No Beta Blocker Indicated: Patient not on beta blockers    Planned monitoring method: Routine monitoring    Informed Consent:  Anesthetic plan and risks discussed with Patient.

## 2021-08-19 NOTE — Op Note (Signed)
DATE OF SERVICE:  08/19/2021    PREOPERATIVE DIAGNOSIS:    UTUC (left) bladder lesion    POSTOPERATIVE DIAGNOSIS:    UTUC (left) bladder lesion    PROCEDURE PERFORMED:    1. Cystoscopy.  2. Gel mitomycin installation, left kidney.  3. Bladder biopsy with fulguration.    SURGEON/STAFF:  Carlota Raspberry, MD    FINDINGS:    1. Erythematous changes indistinguishable from papillary tumor  adjacent to the left UO, biopsied and cauterized.   2. Gel mitomycin installation, unremarkable.    COMPLICATION:  None.    BLOOD LOSS:  Minimal.    ANESTHESIA:  General endotracheal.    ANESTHESIOLOGIST:  Please see Anesthesia record.    SPECIMENS:  Bladder tumor.    COMPLICATIONS:  None.    ATTESTATION:  I was present for all portions of the case.    POSTOPERATIVE PLAN:  The patient will be discharged home and have gel  mitomycin instilled again.     INDICATIONS FOR SURGERY:  The patient is a 70 year old male, solitary  kidney, low-grade upper tract urothelial carcinoma, who was counseled  on management options and elected for gel mitomycin installation.  Risks, benefits, and alternatives extensively discussed.     DESCRIPTION OF PROCEDURE:  Patient was brought to the operative room,  confirmed by name, date of birth, and MRN.  Placed on the operating  room table.  General anesthesia was induced.  Appropriate lines were  placed.  IV antibiotic was given.  Placed in dorsal lithotomy  position.  Extreme care was taken to pad all pressure points.  Prepped and draped in standard sterile fashion.  Time-out was  performed.  With a 30-degree lens and 21.5-French sheath, the bladder  was surveyed.  Left UO was identified, intubated with a Sensor wire,  and subsequently an angiographic catheter was advanced over this into  the renal pelvis.  15 mm of gel mitomycin in a liquid state was  instilled into the renal pelvis over the course of 1 minute and   then the catheter remained in place for 1 minute and then withdrawn.  We re-entered with a  cystoscope.  Cold cup biopsy x2 was taken of a  papillary-appearing lesion indistinguishable from edema versus cancer  recurrence.  These were cauterized.  The UO was visualized and  protected.  The patient's bladder was emptied.  He was extubated and  taken to PACU in good condition.       Job #:  [id number]

## 2021-08-19 NOTE — H&P (Signed)
HISTORY & PHYSICAL - INTERVAL ASSESSMENT    **ONLY TO BE USED IN ADDITION TO A HISTORY & PHYSICAL**    Stephen [name]  [id number]      This interval H&P update references the history and physical documentation from this date:  08/12/21    Current Medical Status:  Unchanged    Medications / Allergies:  Unchanged    Review of Systems:  Unchanged    Physical Examination:  BP 130/71 (BP Location: Left arm, BP Patient Position: Sitting)   Pulse 52   Temp 96.8 F (36 C)   Resp 16   Ht '5\' 6"'  (1.676 m)   Wt 92 kg (202 lb 12.8 oz)   SpO2 97%   BMI 32.73 kg/m   I have examined the patient today.  Unchanged    Laboratory or Clinical Data:  Lab Results   Component Value Date    CO19 Not Detected 08/15/2020     Lab Results   Component Value Date    NA 139 04/28/2021    K 4.3 04/28/2021    CL 102 04/28/2021    BICARB 24 04/28/2021    BUN 21 04/28/2021    CREAT 1.25 (H) 04/28/2021    GLU 96 04/28/2021    [place] 10.0 04/28/2021     Lab Results   Component Value Date    WBC 5.4 07/30/2021    RBC 4.71 07/30/2021    HGB 14.5 07/30/2021    HCT 42.4 07/30/2021    MCV 90.0 07/30/2021    MCHC 34.2 07/30/2021    RDW 13.8 07/30/2021    PLT 180 07/30/2021    MPV 10.9 07/30/2021    SEG 46 07/30/2021    LYMPHS 41 07/30/2021    MONOS 10 07/30/2021    EOS 3 07/30/2021    BASOS 1 07/30/2021     Lab Results   Component Value Date    AST 27 08/18/2020    ALT 9 08/18/2020    ALK 64 08/18/2020    TP 6.8 08/18/2020    ALB 4.7 08/18/2020    TBILI 1.00 08/18/2020     No results found for: INR, PTT      Modifications of Initial Care Plan:  The care plan has been discussed with the attending physician of record and remains unchanged. This was discussed with the patient today on the morning of the procedure. The patient understands the current plan and verbally consents to the proposed treatment.     Stephen [name] Stephen [name]     08/19/21     8:34 AM

## 2021-08-19 NOTE — Anesthesia Postprocedure Evaluation (Signed)
Anesthesia Post Note    Patient: Stephen [name]    Procedure(s) Performed: Procedure(s) with comments:  CYSTOSCOPY, WITH JELMYTO INSTILLATION,  bladder biopsy - with jelmyto, will be done once/week for six weeks consecutive      Final anesthesia type: General    Patient location: PACU    Post anesthesia pain: adequate analgesia    Mental status: awake, alert  and oriented    Airway Patent: Yes    Last Vitals:   Vitals Value Taken Time   BP 130/95 08/19/21 1100   Temp 36.6 C 08/19/21 1100   Pulse 53 08/19/21 1100   Resp 14 08/19/21 1100   SpO2 96 % 08/19/21 1100        Post vital signs: stable    Hydration: adequate    N/V:no    Anesthetic complications: no    Plan of care per primary team.

## 2021-08-20 ENCOUNTER — Ambulatory Visit: Payer: Medicare Other | Attending: Physician Assistant | Admitting: Physician Assistant

## 2021-08-20 ENCOUNTER — Encounter (HOSPITAL_BASED_OUTPATIENT_CLINIC_OR_DEPARTMENT_OTHER): Payer: Self-pay | Admitting: Physician Assistant

## 2021-08-20 VITALS — Temp 99.1°F

## 2021-08-20 DIAGNOSIS — M5442 Lumbago with sciatica, left side: Secondary | ICD-10-CM | POA: Insufficient documentation

## 2021-08-20 DIAGNOSIS — M4310 Spondylolisthesis, site unspecified: Secondary | ICD-10-CM

## 2021-08-20 DIAGNOSIS — M533 Sacrococcygeal disorders, not elsewhere classified: Secondary | ICD-10-CM | POA: Insufficient documentation

## 2021-08-20 DIAGNOSIS — G8929 Other chronic pain: Secondary | ICD-10-CM | POA: Insufficient documentation

## 2021-08-20 DIAGNOSIS — Z981 Arthrodesis status: Secondary | ICD-10-CM | POA: Insufficient documentation

## 2021-08-20 NOTE — Progress Notes (Signed)
PAIN CLINIC FOLLOW UP NOTE    Primary Care Physician [name]    SUBJECTIVE:  This is a 70 year old male with chronic neck and low back pain s/p ALIF L5-S1 05/05/2021 following up for re-evaluation after undergoing a lumbar TPIs on 07/22/21 and reports minimal relief in pain. Back pain is located above and below the  level of the belt line. Denies any radiation. Pain is associated with numbness in left foot most notable with sitting.  Pain is exacerbated with sitting,leaning forward,standing and walking.  Denies any pain with coughing, sneezing or Valsalva like maneuvers Pain is reduced with "nothing". No red flag symptoms He is interested in referral to discuss medicinal cannabis.    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 Since the last visit in Pain Clinic patient reports 10% improvement in their pain from pain treatments. On the pain diagram patient shades in the low back. They describe their pain as constant, pressure, tingling, nagging, radiating, cramping and sharp. Pain is associated with numbness,weakness and muscle spasms.    Patient's highest pain level in the last week is 8/10. Patient's lowest pain level in the past week is 8/10. Patient's average pain level in the last week is: 8/10.    Patient's significant social history is:   Social History     Socioeconomic History    Marital status: Single     Spouse name: Not on file    Number of children: Not on file    Years of education: Not on file    Highest education level: Not on file   Occupational History    Not on file   Tobacco Use    Smoking status:  Never Smoker    Smokeless tobacco: Never Used   Substance and Sexual Activity    Alcohol use: No    Drug use: Not Currently    Sexual activity: Not Currently     Partners: Female   Other Topics Concern    Military Service No    Blood Transfusions Yes    Caffeine Concern No    Occupational Exposure No    Hobby Hazards No    Sleep Concern Yes     Comment: due to my lower back discomfort and leg discomfort    Stress Concern Yes     Comment: Health/ housing/ financial    Weight Concern No    Special Diet No    Back Care Yes     Comment: Am very careful with any activity    Exercises Regularly Yes    Seat Belt Use Yes    Performs Self-Exams Yes    Bike Helmet Use No   Social History Narrative    Not on file     Social Determinants of Health     Financial Resource Strain: Not on file   Food Insecurity: Not on file   Transportation Needs: Not on file   Physical Activity: Not on file   Stress: Not on file   Social Connections: Not on file   Intimate Partner Violence: Not on file   Housing  Stability: Not on file       Past medical history is significant for:   has a past medical history of Chronic back pain, Congenital hydronephrosis, Gout, Headache, Hematuria, Kidney disease, Kidney stones, Major depressive disorder, single episode, Polyarthropathy or polyarthritis of multiple sites, Retinal detachment, and Urethral stricture.    He has no past medical history of AF (atrial fibrillation) (CMS-HCC), Asthma, Chronic kidney disease (CKD), Chronic obstructive airway disease (CMS-HCC), Congestive heart failure (CMS-HCC), Heart disease, Hypertension, or Hypothyroidism.    Current medications include:  Current Outpatient Medications   Medication Sig Dispense Refill    acetaminophen (TYLENOL) 325 MG tablet Take 2 tablets (650 mg) by mouth every 6 hours as needed for Mild Pain (Pain Score 1-3). 30 tablet 0    ALAWAY 0.025 % ophthalmic solution INSTILL 1 DROP INTO BOTH EYES TWICE A DAY AS DIRECTED       allopurinol (ZYLOPRIM) 300 MG tablet Take 300 mg by mouth daily.      amLODIPINE (NORVASC) 5 MG tablet Take 10 mg by mouth daily.      ascorbic acid (VITAMIN C) 500 MG/5ML suspension       ciclopirox (LOPROX) 0.77 % GEL Apply topically 2 times daily.      Polyethyl Glycol-Propyl Glycol (SYSTANE ULTRA OP)       pregabalin (LYRICA) 100 MG capsule TAKE 1 CAPSULE BY MOUTH DAILY AT BEDTIME 30 capsule 5    pregabalin (LYRICA) 75 MG capsule Take 1 capsule (75 mg) by mouth every morning. 30 capsule 0    senna (SENOKOT) 8.6 MG tablet Take 1 tablet (8.6 mg) by mouth daily. 30 tablet 0    triamcinolone (KENALOG) 0.1 % cream Apply 1 Application topically 2 times daily. Apply a thin layer as directed      VITAMIN D PO Take 125 mg by mouth daily.      zolpidem (AMBIEN) 5 MG tablet Take 1 tablet (5 mg) by mouth nightly as needed for Insomnia. 30 tablet 0     No current facility-administered medications for this visit.       Patient's current allergies are:  Amoxicillin and Sulfa drugs    OBJECTIVE:  Physical Exam  Vitals: Temp 99.1 F (37.3 C) (Temporal)   General: no distress, cooperative  Mental Status:  alert, oriented x 3. Speech is clear/ normal  Affect: anxious  Skin:  No rashes or bruises.  HEENT:  Pupils equal, not pinpoint.  Pulmonary:  Breathing easily without tachypnea or bradypnea.  Cardiac:  No LE edema.  Abdomen:  Not distended.  Ambulation: Pt is able to raise from a seated position without difficulty. Gait is not antalgic and the patient ambulates without assistance.   Musculoskeletal:    Back: tender to palpation of the midline spine, bilateral PSIS and SI joint. Increase pain with flexion > extension   Upper Ext: wnls   Lower Ext: wnls      ASSESSMENT  Encounter Diagnoses   Name Primary?    Chronic bilateral low back pain with left-sided sciatica Yes    S/P lumbar fusion     Chronic left SI joint pain     Degenerative spondylolisthesis      This is a 70 year old male undergoing treatment for left  upper tract urothelial carcinoma with chronic neck and low back pain s/p ALIF L5-S1 05/05/2021 following up for re-evaluation after undergoing a lumbar TPIs on 07/22/21 and reports minimal relief in pain. Pain is likely multifactorial including lumbar spondylosis, discogenic pain  as well as SI joint pathology. He is not interested in injections at this time. Requesting referral to [facility] for discussion of medicinal cannabis.    PLAN  Medication Plan:   Referral to [facility] to see [facility]. Will provide medicinal cannabis paperwork at a later time.    Procedure Plan:   Defers at this time    Other:  Interested in low back pain studies. Phone number to coordinator is provided    Follow-up:   As needed    Dr. Noland Fordyce is the attending physician and was immediately available.      Total duration of encounter spent in pre-visit (reviewing last visit, reviewing prior Epic notes and reviewing images), intra-visit (updating relevant history, performing physical exam, creating a treatment plan and medical discussion with patient), and post-visit (note completion and placing of orders) on the day of the encounter, excluding separately reportable services/procedures: 30 minutes.

## 2021-08-20 NOTE — Interdisciplinary (Signed)
Patient is ambulatory via cane.

## 2021-08-21 LAB — URINE CULTURE: Urine Culture Result: NO GROWTH

## 2021-08-24 ENCOUNTER — Other Ambulatory Visit: Payer: Self-pay

## 2021-08-24 NOTE — Telephone Encounter (Signed)
Spoke with pt - advised surgery check in time changed to 7 AM on 09/08. Pt states he arranged pick up at 6 AM, so should not be an issue.

## 2021-08-25 ENCOUNTER — Other Ambulatory Visit (HOSPITAL_BASED_OUTPATIENT_CLINIC_OR_DEPARTMENT_OTHER): Payer: Self-pay | Admitting: Urology

## 2021-08-25 DIAGNOSIS — Z419 Encounter for procedure for purposes other than remedying health state, unspecified: Secondary | ICD-10-CM

## 2021-08-26 ENCOUNTER — Encounter (HOSPITAL_BASED_OUTPATIENT_CLINIC_OR_DEPARTMENT_OTHER): Admission: RE | Disposition: A | Discharge status: Home Health | Payer: Self-pay | Attending: Urology

## 2021-08-26 ENCOUNTER — Ambulatory Visit (HOSPITAL_BASED_OUTPATIENT_CLINIC_OR_DEPARTMENT_OTHER): Payer: Medicare Other | Admitting: Anesthesiology

## 2021-08-26 ENCOUNTER — Ambulatory Visit
Admission: RE | Admit: 2021-08-26 | Discharge: 2021-08-26 | Disposition: A | Discharge status: Home / Self Care | Payer: Medicare Other | Attending: Urology | Admitting: Urology

## 2021-08-26 ENCOUNTER — Ambulatory Visit (HOSPITAL_BASED_OUTPATIENT_CLINIC_OR_DEPARTMENT_OTHER): Payer: Medicare Other

## 2021-08-26 DIAGNOSIS — Z881 Allergy status to other antibiotic agents status: Secondary | ICD-10-CM | POA: Insufficient documentation

## 2021-08-26 DIAGNOSIS — Z79899 Other long term (current) drug therapy: Secondary | ICD-10-CM

## 2021-08-26 DIAGNOSIS — Z419 Encounter for procedure for purposes other than remedying health state, unspecified: Secondary | ICD-10-CM

## 2021-08-26 DIAGNOSIS — Z905 Acquired absence of kidney: Secondary | ICD-10-CM

## 2021-08-26 DIAGNOSIS — C652 Malignant neoplasm of left renal pelvis: Secondary | ICD-10-CM

## 2021-08-26 DIAGNOSIS — C679 Malignant neoplasm of bladder, unspecified: Secondary | ICD-10-CM

## 2021-08-26 DIAGNOSIS — N2889 Other specified disorders of kidney and ureter: Secondary | ICD-10-CM

## 2021-08-26 DIAGNOSIS — Z882 Allergy status to sulfonamides status: Secondary | ICD-10-CM | POA: Insufficient documentation

## 2021-08-26 DIAGNOSIS — Z8551 Personal history of malignant neoplasm of bladder: Secondary | ICD-10-CM | POA: Insufficient documentation

## 2021-08-26 DIAGNOSIS — N3090 Cystitis, unspecified without hematuria: Secondary | ICD-10-CM

## 2021-08-26 DIAGNOSIS — C662 Malignant neoplasm of left ureter: Secondary | ICD-10-CM | POA: Insufficient documentation

## 2021-08-26 SURGERY — CYSTOSCOPY, WITH RETROGRADE PYELOGRAM
Anesthesia: General | Wound class: Class II (Clean Contaminated)

## 2021-08-26 MED ORDER — LACTATED RINGERS IV SOLN
INTRAVENOUS | Status: DC | PRN
Start: 2021-08-26 — End: 2021-08-26

## 2021-08-26 MED ORDER — MENTHOL 3 MG MT LOZG
1.0000 | LOZENGE | OROMUCOSAL | Status: DC | PRN
Start: 2021-08-26 — End: 2021-08-26

## 2021-08-26 MED ORDER — FENTANYL CITRATE (PF) 250 MCG/5ML IJ SOLN
INTRAMUSCULAR | Status: DC | PRN
Start: 2021-08-26 — End: 2021-08-26
  Administered 2021-08-26 (×2): 100 ug via INTRAVENOUS

## 2021-08-26 MED ORDER — MIDAZOLAM HCL 2 MG/2ML IJ SOLN
INTRAMUSCULAR | Status: AC
Start: 2021-08-26 — End: ?
  Filled 2021-08-26: qty 2

## 2021-08-26 MED ORDER — OXYCODONE HCL 5 MG OR TABS
5.0000 mg | ORAL_TABLET | Freq: Once | ORAL | Status: DC | PRN
Start: 2021-08-26 — End: 2021-08-26

## 2021-08-26 MED ORDER — HYDROMORPHONE HCL 1 MG/ML IJ SOLN
0.5000 mg | INTRAMUSCULAR | Status: DC | PRN
Start: 2021-08-26 — End: 2021-08-26

## 2021-08-26 MED ORDER — NALOXONE HCL 0.4 MG/ML IJ SOLN
0.1000 mg | INTRAMUSCULAR | Status: DC | PRN
Start: 2021-08-26 — End: 2021-08-26

## 2021-08-26 MED ORDER — FENTANYL CITRATE (PF) 50 MCG/ML IJ SOLN (WRAPPED RECORD) ~~LOC~~
50.0000 ug | INTRAMUSCULAR | Status: DC | PRN
Start: 2021-08-26 — End: 2021-08-26

## 2021-08-26 MED ORDER — DEXAMETHASONE SODIUM PHOSPHATE 4 MG/ML IJ SOLN (CUSTOM)
INTRAMUSCULAR | Status: DC | PRN
Start: 2021-08-26 — End: 2021-08-26
  Administered 2021-08-26 (×2): 6 mg via INTRAVENOUS

## 2021-08-26 MED ORDER — GLYCOPYRROLATE 1 MG/5ML IJ SOLN
INTRAMUSCULAR | Status: DC | PRN
Start: 2021-08-26 — End: 2021-08-26
  Administered 2021-08-26 (×2): .2 mg via INTRAVENOUS

## 2021-08-26 MED ORDER — LACTATED RINGERS IV SOLN
INTRAVENOUS | Status: DC
Start: 2021-08-26 — End: 2021-08-26

## 2021-08-26 MED ORDER — MITOMYCIN (JELMYTO) 4 MG/ML PYELOCALYCEAL SOLUTION BLADDER INSTILLATION
60.0000 mg | Freq: Once | INTRAVESICAL | Status: AC
Start: 2021-08-26 — End: 2021-08-26
  Administered 2021-08-26 (×2): 60 mg via INTRAVESICAL
  Filled 2021-08-26: qty 15

## 2021-08-26 MED ORDER — PROPOFOL 200 MG/20ML IV EMUL
INTRAVENOUS | Status: DC | PRN
Start: 2021-08-26 — End: 2021-08-26
  Administered 2021-08-26 (×2): 150 mg via INTRAVENOUS

## 2021-08-26 MED ORDER — MIDAZOLAM HCL 2 MG/2ML IJ SOLN
INTRAMUSCULAR | Status: DC | PRN
Start: 2021-08-26 — End: 2021-08-26
  Administered 2021-08-26 (×2): 2 mg via INTRAVENOUS

## 2021-08-26 MED ORDER — ONDANSETRON HCL 4 MG/2ML IV SOLN
4.0000 mg | Freq: Once | INTRAMUSCULAR | Status: DC | PRN
Start: 2021-08-26 — End: 2021-08-26

## 2021-08-26 MED ORDER — FENTANYL CITRATE (PF) 100 MCG/2ML IJ SOLN
INTRAMUSCULAR | Status: AC
Start: 2021-08-26 — End: ?
  Filled 2021-08-26: qty 2

## 2021-08-26 MED ORDER — FENTANYL CITRATE (PF) 50 MCG/ML IJ SOLN (WRAPPED RECORD) ~~LOC~~
25.0000 ug | INTRAMUSCULAR | Status: DC | PRN
Start: 2021-08-26 — End: 2021-08-26

## 2021-08-26 MED ORDER — IOHEXOL 240 MG/ML IJ SOLN
INTRAMUSCULAR | Status: DC | PRN
Start: 2021-08-26 — End: 2021-08-26
  Administered 2021-08-26 (×2): 50 mL via INTRAVENOUS

## 2021-08-26 MED ORDER — CEFAZOLIN SODIUM 1 GM IJ SOLR
INTRAMUSCULAR | Status: DC | PRN
Start: 2021-08-26 — End: 2021-08-26
  Administered 2021-08-26 (×2): 2000 mg via INTRAVENOUS

## 2021-08-26 MED ORDER — LIDOCAINE HCL (PF) 1 % IJ SOLN
0.1000 mL | Freq: Once | INTRAMUSCULAR | Status: DC | PRN
Start: 2021-08-26 — End: 2021-08-26

## 2021-08-26 MED ORDER — LIDOCAINE HCL 2 % IJ SOLN WRAPPED RECORD
INTRAMUSCULAR | Status: DC | PRN
Start: 2021-08-26 — End: 2021-08-26
  Administered 2021-08-26 (×2): 50 mg via INTRAVENOUS

## 2021-08-26 MED ORDER — ACETAMINOPHEN 325 MG PO TABS
975.0000 mg | ORAL_TABLET | Freq: Once | ORAL | Status: AC
Start: 2021-08-26 — End: 2021-08-26
  Administered 2021-08-26 (×2): 975 mg via ORAL
  Filled 2021-08-26: qty 3

## 2021-08-26 SURGICAL SUPPLY — 15 items
BAG CYSTO DRAIN SKYTRON TABLE (Misc Surgical Supply) ×2 IMPLANT
CATHETER URETERAL OPEN END 5FR X 70CM (Procedural wires/sheaths/catheters/balloons/dilators) ×2 IMPLANT
ELECTRODE CUTTING BIPOLAR 22FR 12/30 DEGREE (Cautery) ×2 IMPLANT
ELECTRODE CUTTING LOOP ROUND MONOPOLAR 22FR 12/30 DEGREE (Cautery) ×2 IMPLANT
GLOVE SURGEON BIOGEL SIZE 7.5 (Gloves/Gowns) ×2 IMPLANT
GUIDEWIRE SENSOR DUAL FLEX STRAIGHT TIP .035 X 150CM NITINOL (Procedural wires/sheaths/catheters/balloons/dilators) ×2 IMPLANT
SCRUB, SURGICAL, EXIDINE, 4%, CHG, 4OZ (Prep Solutions) IMPLANT
SLEEVE SCD KNEE MEDIUM (Patient Care Supply) ×2 IMPLANT
SOLUTION IRR BAG H20 3000ML (Non-Pharmacy Meds/Solutions) ×4 IMPLANT
SOLUTION IRR POUR BTL 0.9% NS 1000ML (Non-Pharmacy Meds/Solutions) IMPLANT
SURGICAL PACK CYSTO - SAME DAY (Procedure Packs/kits) ×2 IMPLANT
SYRINGE HYPO LL 30CC (Needles/punch/cannula/biopsy) ×2 IMPLANT
TRAY FOLEY SURESTEP LUBRI-SIL I.C.16FR URIMETER, LF (Lines/Drains) IMPLANT
TUBING SUCTION MEDI-VAC 9/32" X 20' (Tubing/Suction) ×2 IMPLANT
UNDERPAD 30X36 (Patient Care Supply) ×2 IMPLANT

## 2021-08-26 NOTE — Plan of Care (Signed)
Problem: Promotion of Perioperative Health and Safety  Goal: Promotion of Health and Safety of the Perioperative Patient  Description: The patient remains safe, receives treatment appropriate to the surgical intervention and patient's physiological needs and is discharged or transferred to the appropriate level of care.    Information below is the current care plan.  Outcome: Progressing  Flowsheets (Taken 08/26/2021 1155)  Guidelines: PACU  Individualized Interventions/Recommendations #1: manage pain

## 2021-08-26 NOTE — Op Note (Signed)
PREOPERATIVE DIAGNOSIS:    UTUC (left) bladder lesion    POSTOPERATIVE DIAGNOSIS:    UTUC (left) bladder lesion    PROCEDURE PERFORMED:    1. Cystoscopy.  2. Gel mitomycin installation, left kidney.  3. Transurethral resection of bladder tumor    SURGEON/STAFF:  Carlota Raspberry, MD  RESIDENTReeves Forth    FINDINGS:    1. Biopsy proven urothelial cancer near left UO resected and cauterized.   2. Gel mitomycin installation, unremarkable.    COMPLICATION:  None.    BLOOD LOSS:  Minimal.    ANESTHESIA:  General endotracheal.    ANESTHESIOLOGIST:  Please see Anesthesia record.    SPECIMENS:  Bladder tumor.    COMPLICATIONS:  None.    ATTESTATION:  I was present for all portions of the case.    POSTOPERATIVE PLAN:  The patient will be discharged home and have gel  mitomycin instilled again.     INDICATIONS FOR SURGERY:  The patient is a 70 year old male, solitary  kidney, low-grade upper tract urothelial carcinoma, who was counseled  on management options and elected for gel mitomycin installation.  Risks, benefits, and alternatives extensively discussed.     DESCRIPTION OF PROCEDURE:  Patient was brought to the operative room,  confirmed by name, date of birth, and MRN.  Placed on the operating  room table.  General anesthesia was induced.  Appropriate lines were  placed.  IV antibiotic was given.  Placed in dorsal lithotomy  position.  Extreme care was taken to pad all pressure points.  Prepped and draped in standard sterile fashion.  Time-out was  performed.  With a 30-degree lens and 21.5-French sheath, the bladder  was surveyed.  Left UO was identified, intubated with a Sensor wire,  and subsequently an angiographic catheter was advanced over this into  the renal pelvis.  15 mm of gel mitomycin in a liquid state was  instilled into the renal pelvis over the course of 1 minute and   then the catheter remained in place for 1 minute and then withdrawn.  We re-entered with a resectoscopy.  Any abnormal  appearing mucosa on left lateral wall was resected These were cauterized.  The UO was visualized and  protected.  The patient's bladder was emptied.  He was extubated and  taken to PACU in good condition.

## 2021-08-26 NOTE — Discharge Instructions (Signed)
If you have a foley catheter, your foley catheter will stay in place until your follow-up appointment with Urology for removal.  You may resume your normal activity and your normal diet as tolerated.   Your nurse will teach you how to take care of the catheter before you go home.    OK to restart all home medications except aspirin/aleve/ibuprofen, plavix, coumadin, lovenox until notified by your physician at clinic visit.   You may be provided with pyridium for bladder discomfort or bladder spasms. Take this medication as needed. This medication turns your urine Hartman which is expected.      You may see blood in your urine which is normal.This will clear up over time. Drinking lots of water helps clear up urine faster. Seek immediate attention if abdominal pain, passing large clots, urine turns ketch-up color or stops flowing and you cannot empty your bladder accompanied by abdominal pain, fevers >101.4, pain not controlled by pain medication and intractable nausea and vomiting, chest pain or shortness of breath, inability to urinate.     You will be called to schedule a follow up appointment for our outpatient Urology Clinic to discuss your pathology results.

## 2021-08-26 NOTE — Anesthesia Preprocedure Evaluation (Addendum)
ANESTHESIA PRE-OPERATIVE EVALUATION    Patient Information    Name: Stephen [name]    MRN: [id number]    DOB: September 02, 1951    Age: 70 year old    Sex: male  Procedure(s) with comments:  CYSTOSCOPY, [place] - with jelmyto, will be done once/week for six weeks consecutive      Pre-op Vitals:   There were no vitals taken for this visit.        Primary language spoken:  English    ROS/Medical History:      History of Present Illness: 70 yo:  CYSTOSCOPY, WITH RETROGRADE PYELOGRAM WITH JELMYTO INSTILLATION, TURP, possible stent    General:  negative for General ROS  able to climb flight of stairs/Exercise tolaerance >4 mets,   Cardiovascular:  negative cardio ROS     Anesthesia History:  negative anesthesia history ROS  Previous Grade III needed bougie, glidescope used on most recent intubation without issue Pulmonary:   negative pulmonary ROS     Neuro/Psych:   psychiatric history,   Hematology/Oncology:   history of cancer,      GI/Hepatic:  negative GI/hepatic ROS Infectious Disease:     Renal:  chronic renal disease,  Kidney Stones Endocrine/Other:  arthritis,   back pain,  Gout   Pregnancy History:   Pediatrics:         Pre Anesthesia Testing (PCC/CPC) notes/comments:                 Physical Exam    Airway:    Inter-inciser distance 3-4 cm  Prognanth Able    Mallampati: III  Neck ROM: limited  TM distance: 5-6 cm  Short thick neck: No          Cardiovascular:  - cardiovascular exam normal         Pulmonary:  - pulmonary exam normal           Neuro/Neck/Skeletal/Skin:  - Neck/Neuro/Skeletal/Skin exam normal          Dental:    Comment: Multiple broken, missing and rotted teeth.      Abdominal:      General: obesity     Additional Clinical Notes:               Last  OSA (STOP BANG) Score:  No data recorded    Last OSA  (STOP) Score for   Has a physician diagnosed you with sleep apnea?: No  Do you use a CPAP at home?: No  Do you snore loudly (loud enough to be heard through a  closed door)?: 0  Do you often feel tired, fatigued or sleepy during the day?: 1  Has anyone observed that you stop breathing while you are sleeping?: 0  Have you ever been treated for high blood pressure?: 1  OSA total score (A score of 2 or more is high risk. Offer patient sleep study.): 2                   Past Medical History:   Diagnosis Date    Chronic back pain     Congenital hydronephrosis     Gout     Headache     Hematuria     Kidney disease     Kidney stones     Major depressive disorder, single episode     Polyarthropathy or polyarthritis of multiple sites     Retinal detachment     Urethral stricture  Past Surgical History:   Procedure Laterality Date    CT INSERTION OF SUPRAPUBIC CATH  09/25/2015    NEPHRECTOMY Right 1995    APPENDECTOMY      COLONOSCOPY      CYSTOSCOPY      CYSTOSCOPY W/ LASER LITHOTRIPSY      OTHER SURGICAL HISTORY      Interstim 01/29/2011    SPINE SURGERY  09/21    Lumbar-sacral fusion    TRANSURETHRAL RESECTION OF PROSTATE       Social History     Socioeconomic History    Marital status: Single   Tobacco Use    Smoking status: Never Smoker    Smokeless tobacco: Never Used   Substance and Sexual Activity    Alcohol use: No    Drug use: Not Currently    Sexual activity: Not Currently     Partners: Female   Other Topics Concern    Military Service No    Blood Transfusions Yes    Caffeine Concern No    Occupational Exposure No    Hobby Hazards No    Sleep Concern Yes     Comment: due to my lower back discomfort and leg discomfort    Stress Concern Yes     Comment: Health/ housing/ financial    Weight Concern No    Special Diet No    Back Care Yes     Comment: Am very careful with any activity    Exercises Regularly Yes    Seat Belt Use Yes    Performs Self-Exams Yes    Bike Helmet Use No     Alcohol Use: Not on file       No current facility-administered medications for this visit.     No current outpatient medications on file.      Facility-Administered Medications Ordered in Other Visits   Medication Dose Route Frequency Provider Last Rate Last Admin    lactated ringers infusion   IntraVENOUS Continuous Carlota Raspberry, MD 30 mL/hr at 08/26/21 0710 New Bag at 08/26/21 0710    lidocaine (XYLOCAINE) 1% PF injection 0.1 mL  0.1 mL IntraDERMAL Once PRN Carlota Raspberry, MD        mitomycin (JELMYTO) 4 mg/mL pyelocalyceal solution bladder instillation 60 mg  60 mg IntraVESICAL Once Carlota Raspberry, MD         Allergies   Allergen Reactions    Amoxicillin Rash    Sulfa Drugs Unspecified       Labs and Other Data  Lab Results   Component Value Date    NA 139 04/28/2021    K 4.3 04/28/2021    CL 102 04/28/2021    BICARB 24 04/28/2021    BUN 21 04/28/2021    CREAT 1.25 (H) 04/28/2021    GLU 96 04/28/2021    [place] 10.0 04/28/2021     Lab Results   Component Value Date    AST 27 08/18/2020    ALT 9 08/18/2020    ALK 64 08/18/2020    TP 6.8 08/18/2020    ALB 4.7 08/18/2020    TBILI 1.00 08/18/2020     Lab Results   Component Value Date    WBC 5.4 07/30/2021    RBC 4.71 07/30/2021    HGB 14.5 07/30/2021    HCT 42.4 07/30/2021    MCV 90.0 07/30/2021    MCHC 34.2 07/30/2021    RDW 13.8 07/30/2021    PLT 180 07/30/2021    MPV 10.9  07/30/2021    SEG 46 07/30/2021    LYMPHS 41 07/30/2021    MONOS 10 07/30/2021    EOS 3 07/30/2021    BASOS 1 07/30/2021     Lab Results   Component Value Date    INR 1.2 08/18/2020    PTT 28 08/18/2020     Lab Results   Component Value Date    ARTPH 7.42 08/18/2020    ARTPO2 218 (H) 08/18/2020    ARTPCO2 35 (L) 08/18/2020       Anesthesia Plan:  Risks and Benefits of Anesthesia  I have personally performed an appropriate pre-anesthesia physical exam of the patient (including heart, lungs, and airway) prior to the anesthetic and reviewed the pertinent medical history, drug and allergy history, laboratory and imaging studies and consultations.   I have determined that the patient has had adequate assessment and testing.  I  have validated the documentation of these elements of the patient exam and/or have made necessary changes to reflect my own observations during my pre-anesthesia exam.  Anesthetic techniques, invasive monitors, anesthetic drugs for induction, maintenance and post-operative analgesia, risks and alternatives have been explained to the patient and/or patient's representatives.    I have prescribed the anesthetic plan:         Planned anesthesia method: General         ASA 3 (Severe systemic disease)     Potential anesthesia problems identified and risks including but not limited to the following were discussed with patient and/or patient's representative: Adverse or allergic drug reaction, Dental injury or sore throat and Injury to brain, heart and other organs    No Beta Blocker Indicated: Patient not on beta blockers    Planned monitoring method: Routine monitoring    Informed Consent:  Anesthetic plan and risks discussed with Patient.

## 2021-08-26 NOTE — H&P (Signed)
HISTORY & PHYSICAL - INTERVAL ASSESSMENT    **ONLY TO BE USED IN ADDITION TO A HISTORY & PHYSICAL**    Stephen [name]  [id number]      This interval H&P update references the history and physical documentation from this date:  08/19/21    Current Medical Status:  Unchanged    Medications / Allergies:  Unchanged    Review of Systems:  Unchanged    Physical Examination:  BP 127/74 (BP Location: Left arm, BP Patient Position: Sitting)   Pulse 52   Temp 97.2 F (36.2 C)   Resp 17   Ht '5\' 6"'  (1.676 m)   Wt 91.5 kg (201 lb 11.2 oz)   SpO2 95%   BMI 32.56 kg/m   I have examined the patient today.  Unchanged    Laboratory or Clinical Data:  Lab Results   Component Value Date    CO19 Not Detected 08/15/2020     Lab Results   Component Value Date    NA 139 04/28/2021    K 4.3 04/28/2021    CL 102 04/28/2021    BICARB 24 04/28/2021    BUN 21 04/28/2021    CREAT 1.25 (H) 04/28/2021    GLU 96 04/28/2021    [place] 10.0 04/28/2021     Lab Results   Component Value Date    WBC 5.4 07/30/2021    RBC 4.71 07/30/2021    HGB 14.5 07/30/2021    HCT 42.4 07/30/2021    MCV 90.0 07/30/2021    MCHC 34.2 07/30/2021    RDW 13.8 07/30/2021    PLT 180 07/30/2021    MPV 10.9 07/30/2021    SEG 46 07/30/2021    LYMPHS 41 07/30/2021    MONOS 10 07/30/2021    EOS 3 07/30/2021    BASOS 1 07/30/2021     Lab Results   Component Value Date    AST 27 08/18/2020    ALT 9 08/18/2020    ALK 64 08/18/2020    TP 6.8 08/18/2020    ALB 4.7 08/18/2020    TBILI 1.00 08/18/2020     No results found for: INR, PTT      Modifications of Initial Care Plan:  The care plan has been discussed with the attending physician of record and remains unchanged. This was discussed with the patient today on the morning of the procedure. The patient understands the current plan and verbally consents to the proposed treatment.     Tawny Hopping Nailea Whitehorn     08/26/21     10:26 AM

## 2021-08-26 NOTE — Anesthesia Postprocedure Evaluation (Signed)
Anesthesia Post Note    Patient: Stephen [name]    Procedure(s) Performed: Procedure(s) with comments:  CYSTOSCOPY, WITH RETROGRADE PYELOGRAM WITH JELMYTO INSTILLATION,TRANSURETHRAL RESECTION OF BLADDER TUMOR - with jelmyto, will be done once/week for six weeks consecutive      Final anesthesia type: General    Patient location: PACU    Post anesthesia pain: adequate analgesia    Mental status: awake, alert  and oriented    Airway Patent: Yes    Last Vitals:   Vitals Value Taken Time   BP 116/66 08/26/21 1145   Temp 36.7 C 08/26/21 1138   Pulse 53 08/26/21 1149   Resp 10 08/26/21 1149   SpO2 96 % 08/26/21 1149   Vitals shown include unvalidated device data.     Post vital signs: stable    Hydration: adequate    N/V:no    Anesthetic complications: no    Plan of care per primary team.

## 2021-08-27 ENCOUNTER — Telehealth (HOSPITAL_BASED_OUTPATIENT_CLINIC_OR_DEPARTMENT_OTHER): Payer: Self-pay | Admitting: Urology

## 2021-08-27 NOTE — Telephone Encounter (Signed)
Stephen [name] / Medical Oncology Social Work Telephone note:    Today I received an epic message from Dr. Victorino December office that pt sent a MyChart message: "I need a case manager to discuss my options of where to go should my health gets more involved. Staying where I am at will not be an option.  My nurse yesterday said a referral will need to come from Dr.B"    I performed a chart review.  This pt has upper tract urothelial carcinoma and has had surgical intervention with Dr. Sallyanne Kuster. He has other significant health issues and is managed by [facility].     Prior RN CM notes from inpatient stay dated 08/19/20 show that pt resided with family at that time : "Lives with sister and brother-in-law , however, pt cannot depend on their support".  Pt has had  services from [facility]    I called pt and left a message.  Will await return call.    I also called [facility] and was given the number of pt.'s case manager, Stephen [name], direct office # [phone] or [phone].  This may be a helpful contact for our staff for pt's resource needs.    Dr. Victorino December office updated via epic message.    [name], [place] Social Worker  [phone]

## 2021-08-27 NOTE — Telephone Encounter (Signed)
Hi Jane,    Are you able to assist this patient with his request? Sounds like he needs assistance with where to stay if his health gets more involved. Appreciate you looking into this.    Thank you,  Lisette Abu, Lynnell Chad "[name]"  You 1 hour ago (9:38 AM)     DW      [id number] I need a case manager to discuss my options of where to go should my health gets more involved staying where I am at will not be an option my nurse yesterday said a referral will need to come from Dr.B

## 2021-08-28 LAB — URINE CULTURE: Urine Culture Result: <10000

## 2021-08-29 ENCOUNTER — Other Ambulatory Visit: Payer: Self-pay

## 2021-08-29 ENCOUNTER — Encounter (HOSPITAL_BASED_OUTPATIENT_CLINIC_OR_DEPARTMENT_OTHER): Payer: Self-pay | Admitting: Physician Assistant

## 2021-08-30 ENCOUNTER — Telehealth (HOSPITAL_BASED_OUTPATIENT_CLINIC_OR_DEPARTMENT_OTHER): Payer: Self-pay

## 2021-08-30 NOTE — Telephone Encounter (Signed)
[place] / Medical Oncology Social Work Telephone note:    Interview data:  Stephen [name] returned my call today.  He presented as open, talkative, a bit pressured speech to tell his story.  He said he currently lives with his sister, but her husband and daughter are not supportive of him staying there.  He is vulnerable with his cancer and health condition and does not want the other family members to know.  And, even with this knowledge, they would still want him to leave.    Stephen [name] has tried homeless resources by calling 2-1-1, but they were not helpful.  He has a Education officer, museum through [place], Stephen [name], and has worked with her but to no avail for housing placement.    Stephen [name] stated that he has lost everything d/t relationship where someone took advantage of him.  He also stated cancer treatment has been a financial hardship.  He said he gets about $900/month in social security which leave little to live on Stephen [name], which is understandable.    Stephen [name] uses the bus for transportation and also [place] transportation services for medical appts.  He expresses sincere gratitude for the help from [place].    I reviewed housing placement program in [place] that provides an in-person housing assessment "VI-SPDAT" (Vulnerability IndexService Prioritization Decision Assistance Tool).  I strongly encouraged Stephen [name] to call 2-1-1 or go to [facility] in [place] and make an appt for an intake.  Informed him that they have appts available Mondays and Tuesdays from 8:30 am to 1 pm.  Also informed Stephen [name] that this is not emergency housing but long term solution.  Told him to emphasize his basic needs priorities and share his vulnerability (e.g. cancer treatment) during assessment.    Stephen [name] and I discussed that he does not meet the medical criteria for a SNF.  I informed him that such placements only occur from inpatient when an individual becomes significantly impaired physically.  He v/u.    Offered several  times to connect Stephen [name] via 3-way phone call to [place] for a transportation grant that could pay somewhere between $100-$300.  Informed Stephen [name] he will only qualify while on chemotherapy.  I offered to complete app for him, he just needs to be on the call to request.  Stephen [name] said he'd think about it, but he'd have to call back when he had privacy b/c he doesn't want his sister's family to know his actions.  (They will eavesdrop on his phone calls.)    Intervention:  Anticipatory guidance, Collaboration with medical team, Promoted positive self advocacy skills, and Provided empathic listening.       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  Pt is able to voice his resource needs and has complex psychosocial history.  Pt was open to social work intervention and expressed thanks for my call.     Plan:   E-mail sent to Stephen [name] ([email or web address]) with PDF explaining homeless assessment program, "VI-SPDAT".   Message left for Stephen [name], case Metallurgist, of [employer].  Stephen [name] gave me verbal permission to call her.   Stephen [name] strongly encouraged to call me back, so I can connect him via 3-way call to [place] for a  grant.   Dr. Sallyanne [name] and team updated via epic message.   SW to follow as needed.    Stephen [name], [place] Social Worker  [phone]

## 2021-09-01 ENCOUNTER — Other Ambulatory Visit (HOSPITAL_BASED_OUTPATIENT_CLINIC_OR_DEPARTMENT_OTHER): Payer: Self-pay | Admitting: Urology

## 2021-09-01 DIAGNOSIS — Z419 Encounter for procedure for purposes other than remedying health state, unspecified: Secondary | ICD-10-CM

## 2021-09-02 ENCOUNTER — Encounter (HOSPITAL_BASED_OUTPATIENT_CLINIC_OR_DEPARTMENT_OTHER): Payer: Self-pay | Admitting: Physician Assistant

## 2021-09-02 ENCOUNTER — Encounter (HOSPITAL_BASED_OUTPATIENT_CLINIC_OR_DEPARTMENT_OTHER): Payer: Self-pay | Admitting: Urology

## 2021-09-02 ENCOUNTER — Ambulatory Visit (HOSPITAL_BASED_OUTPATIENT_CLINIC_OR_DEPARTMENT_OTHER): Payer: Medicare Other

## 2021-09-02 ENCOUNTER — Encounter (HOSPITAL_BASED_OUTPATIENT_CLINIC_OR_DEPARTMENT_OTHER): Admission: RE | Disposition: A | Discharge status: Home Health | Payer: Self-pay | Attending: Urology

## 2021-09-02 ENCOUNTER — Ambulatory Visit (HOSPITAL_BASED_OUTPATIENT_CLINIC_OR_DEPARTMENT_OTHER): Payer: Medicare Other | Admitting: Anesthesiology

## 2021-09-02 ENCOUNTER — Ambulatory Visit
Admission: RE | Admit: 2021-09-02 | Discharge: 2021-09-02 | Disposition: A | Discharge status: Home / Self Care | Payer: Medicare Other | Attending: Urology | Admitting: Urology

## 2021-09-02 DIAGNOSIS — Z79899 Other long term (current) drug therapy: Secondary | ICD-10-CM | POA: Insufficient documentation

## 2021-09-02 DIAGNOSIS — Z419 Encounter for procedure for purposes other than remedying health state, unspecified: Secondary | ICD-10-CM

## 2021-09-02 DIAGNOSIS — Z881 Allergy status to other antibiotic agents status: Secondary | ICD-10-CM | POA: Insufficient documentation

## 2021-09-02 DIAGNOSIS — Z882 Allergy status to sulfonamides status: Secondary | ICD-10-CM | POA: Insufficient documentation

## 2021-09-02 DIAGNOSIS — Z6832 Body mass index (BMI) 32.0-32.9, adult: Secondary | ICD-10-CM | POA: Insufficient documentation

## 2021-09-02 DIAGNOSIS — C679 Malignant neoplasm of bladder, unspecified: Secondary | ICD-10-CM | POA: Insufficient documentation

## 2021-09-02 DIAGNOSIS — N2889 Other specified disorders of kidney and ureter: Secondary | ICD-10-CM

## 2021-09-02 DIAGNOSIS — C662 Malignant neoplasm of left ureter: Secondary | ICD-10-CM | POA: Insufficient documentation

## 2021-09-02 DIAGNOSIS — E669 Obesity, unspecified: Secondary | ICD-10-CM | POA: Insufficient documentation

## 2021-09-02 DIAGNOSIS — Z905 Acquired absence of kidney: Secondary | ICD-10-CM | POA: Insufficient documentation

## 2021-09-02 SURGERY — CYSTOSCOPY, WITH RETROGRADE PYELOGRAM
Anesthesia: General | Wound class: Class II (Clean Contaminated)

## 2021-09-02 MED ORDER — PROPOFOL 200 MG/20ML IV EMUL
INTRAVENOUS | Status: DC | PRN
Start: 2021-09-02 — End: 2021-09-02
  Administered 2021-09-02 (×2): 200 mg via INTRAVENOUS

## 2021-09-02 MED ORDER — ACETAMINOPHEN 325 MG PO TABS
650.0000 mg | ORAL_TABLET | ORAL | 0 refills | Status: DC | PRN
Start: 2021-09-02 — End: 2021-11-11

## 2021-09-02 MED ORDER — LACTATED RINGERS IV SOLN
INTRAVENOUS | Status: DC | PRN
Start: 2021-09-02 — End: 2021-09-02

## 2021-09-02 MED ORDER — LIDOCAINE HCL (PF) 1 % IJ SOLN
0.1000 mL | Freq: Once | INTRAMUSCULAR | Status: DC | PRN
Start: 2021-09-02 — End: 2021-09-02

## 2021-09-02 MED ORDER — NALOXONE HCL 0.4 MG/ML IJ SOLN
0.1000 mg | INTRAMUSCULAR | Status: DC | PRN
Start: 2021-09-02 — End: 2021-09-02

## 2021-09-02 MED ORDER — DIPHENHYDRAMINE HCL 50 MG/ML IJ SOLN
12.5000 mg | Freq: Once | INTRAMUSCULAR | Status: DC | PRN
Start: 2021-09-02 — End: 2021-09-02

## 2021-09-02 MED ORDER — MIDAZOLAM HCL 2 MG/2ML IJ SOLN
INTRAMUSCULAR | Status: AC
Start: 2021-09-02 — End: ?
  Filled 2021-09-02: qty 2

## 2021-09-02 MED ORDER — ACETAMINOPHEN 325 MG PO TABS
975.0000 mg | ORAL_TABLET | Freq: Once | ORAL | Status: AC
Start: 2021-09-02 — End: 2021-09-02
  Administered 2021-09-02 (×2): 975 mg via ORAL
  Filled 2021-09-02: qty 3

## 2021-09-02 MED ORDER — MITOMYCIN (JELMYTO) 4 MG/ML PYELOCALYCEAL SOLUTION BLADDER INSTILLATION
60.0000 mg | Freq: Once | INTRAVESICAL | Status: DC
Start: 2021-09-02 — End: 2021-09-02
  Filled 2021-09-02: qty 15

## 2021-09-02 MED ORDER — FENTANYL CITRATE (PF) 50 MCG/ML IJ SOLN (WRAPPED RECORD) ~~LOC~~
50.0000 ug | INTRAMUSCULAR | Status: DC | PRN
Start: 2021-09-02 — End: 2021-09-02

## 2021-09-02 MED ORDER — LIDOCAINE HCL 2 % IJ SOLN WRAPPED RECORD
INTRAMUSCULAR | Status: DC | PRN
Start: 2021-09-02 — End: 2021-09-02
  Administered 2021-09-02 (×2): 80 mg via INTRAVENOUS

## 2021-09-02 MED ORDER — ONDANSETRON HCL 4 MG/2ML IV SOLN
INTRAMUSCULAR | Status: DC | PRN
Start: 2021-09-02 — End: 2021-09-02
  Administered 2021-09-02 (×2): 4 mg via INTRAVENOUS

## 2021-09-02 MED ORDER — LACTATED RINGERS IV SOLN
INTRAVENOUS | Status: DC
Start: 2021-09-02 — End: 2021-09-02

## 2021-09-02 MED ORDER — IOHEXOL 240 MG/ML IJ SOLN
INTRAMUSCULAR | Status: DC | PRN
Start: 2021-09-02 — End: 2021-09-02
  Administered 2021-09-02 (×2): 5 mL

## 2021-09-02 MED ORDER — MIDAZOLAM HCL 2 MG/2ML IJ SOLN
INTRAMUSCULAR | Status: DC | PRN
Start: 2021-09-02 — End: 2021-09-02
  Administered 2021-09-02 (×2): 2 mg via INTRAVENOUS

## 2021-09-02 MED ORDER — CEFAZOLIN SODIUM 1 GM IJ SOLR
INTRAMUSCULAR | Status: DC | PRN
Start: 2021-09-02 — End: 2021-09-02
  Administered 2021-09-02 (×2): 2000 mg via INTRAVENOUS

## 2021-09-02 MED ORDER — FENTANYL CITRATE (PF) 50 MCG/ML IJ SOLN (WRAPPED RECORD) ~~LOC~~
25.0000 ug | INTRAMUSCULAR | Status: DC | PRN
Start: 2021-09-02 — End: 2021-09-02

## 2021-09-02 MED ORDER — ONDANSETRON HCL 4 MG/2ML IV SOLN
4.0000 mg | Freq: Once | INTRAMUSCULAR | Status: DC | PRN
Start: 2021-09-02 — End: 2021-09-02

## 2021-09-02 MED ORDER — MITOMYCIN (JELMYTO) 4 MG/ML PYELOCALYCEAL SOLUTION BLADDER INSTILLATION
INTRAVESICAL | Status: DC | PRN
Start: 2021-09-02 — End: 2021-09-02
  Administered 2021-09-02 (×2): 60 mg via INTRAVESICAL

## 2021-09-02 MED ORDER — OXYCODONE HCL 5 MG OR TABS
5.0000 mg | ORAL_TABLET | Freq: Once | ORAL | Status: DC | PRN
Start: 2021-09-02 — End: 2021-09-02

## 2021-09-02 MED ORDER — DEXAMETHASONE SODIUM PHOSPHATE 4 MG/ML IJ SOLN (CUSTOM)
INTRAMUSCULAR | Status: DC | PRN
Start: 2021-09-02 — End: 2021-09-02
  Administered 2021-09-02 (×2): 6 mg via INTRAVENOUS

## 2021-09-02 MED ORDER — FENTANYL CITRATE (PF) 100 MCG/2ML IJ SOLN
INTRAMUSCULAR | Status: AC
Start: 2021-09-02 — End: ?
  Filled 2021-09-02: qty 2

## 2021-09-02 MED ORDER — HYDROMORPHONE HCL 1 MG/ML IJ SOLN
0.5000 mg | INTRAMUSCULAR | Status: DC | PRN
Start: 2021-09-02 — End: 2021-09-02

## 2021-09-02 SURGICAL SUPPLY — 16 items
BAG CYSTO DRAIN SKYTRON TABLE (Misc Surgical Supply) ×2 IMPLANT
CATHETER URETERAL OPEN END 5FR X 70CM (Procedural wires/sheaths/catheters/balloons/dilators) ×2 IMPLANT
GLOVE BIOGEL PI INDICATOR SIZE 7 (Gloves/Gowns) ×2 IMPLANT
GLOVE BIOGEL PI ULTRATOUCH SIZE 7.5 (Gloves/Gowns) ×2
GLOVE SURGEON BIOGEL SIZE 7.5 (Gloves/Gowns) ×4 IMPLANT
GOWN SURGICAL ULTRA LG BLUE, AAMI LVL 3 (Gloves/Gowns) ×2 IMPLANT
GUIDEWIRE SENSOR DUAL FLEX STRAIGHT TIP .035 X 150CM NITINOL (Procedural wires/sheaths/catheters/balloons/dilators) ×2 IMPLANT
SCRUB, SURGICAL, EXIDINE, 4%, CHG, 4OZ (Prep Solutions) ×2
SLEEVE SCD KNEE MEDIUM (Patient Care Supply) ×2 IMPLANT
SOLUTION IRR BAG .9% N/S 3000ML (Non-Pharmacy Meds/Solutions) ×4 IMPLANT
SOLUTION IRR POUR BTL 0.9% NS 1000ML (Non-Pharmacy Meds/Solutions) IMPLANT
SURGICAL PACK CYSTO - SAME DAY (Procedure Packs/kits) ×2
SYRINGE HYPO LL 30CC (Needles/punch/cannula/biopsy) ×2 IMPLANT
TRAY FOLEY SURESTEP LUBRI-SIL I.C.16FR URIMETER, LF (Lines/Drains)
TUBING SUCTION MEDI-VAC 9/32" X 20' (Tubing/Suction) ×2
UNDERPAD 30X36 (Patient Care Supply) ×2

## 2021-09-02 NOTE — Brief Op Note (Signed)
BRIEF OPERATIVE NOTE     CASE ID: [id number]    DATE: 09/02/2021  TIME: 9:59 AM    Preoperative Diagnosis:  Malignant neoplasm of urinary bladder, unspecified site (CMS-HCC) [C67.9]  Postoperative Diagnosis: Urothelial carcinoma, left proximal ureter    Procedure:  Procedure(s):  CYSTOSCOPY, WITH RETROGRADE PYELOGRAM WITH JELMYTO INSTILLATION - Wound Class: Class II (Clean Contaminated) - Incision Closure: No Incision / NA     Surgeons:  Primary: Carlota Raspberry, MD  Resident - Assisting: Bernardo Heater, MD    Anesthesia:   Anesthesiologist: Johney Maine, MD     OR Staff  Circulator: Si Gaul, RN; Joline Maxcy, RN  Scrub: Lawerance Cruel    Findings: Left renal pelvis dilated on retrograde pyelogram, no changes compared to previous    Wound classification :  Procedure(s):  CYSTOSCOPY, WITH RETROGRADE PYELOGRAM WITH JELMYTO INSTILLATION - Wound Class: Class II (Clean Contaminated) - Incision Closure: No Incision / NA    Wound closure status:  Procedure(s):  CYSTOSCOPY, WITH RETROGRADE PYELOGRAM WITH JELMYTO INSTILLATION - Wound Class: Class II (Clean Contaminated) - Incision Closure: No Incision / NA    Specimens: * No specimens in log *    Implants: * No implants in log *    Antibiotics:  Ancef    DVT Prophylaxis: SCD    Chemo Prophylaxis: No; Clarify why Brief proecedure    Fluids/Blood Products:    IV Fluids: 300 cc    Blood Products: None    EBL: 0 cc    Urine Output: Not Recorded     Complications: None    Dispo: PACU then home    Event Time In   In Facility 0704   Pre Procedure Start 0733   PreOp Nurse Complete 0821   Pre Procedure Tasks Complete 0852   Room Setup Start 0851   Room Ready for Patient 0900   In Room 0902   Incision 0915   Closing Started 0938   Closing Complete 0939   Out of Room 0943   Room Cleanup Start    Room Cleanup End    In PACU 0945   PACU Criteria Complete    In Recovery    Recovery Criteria Complete    Ready For Visitors    Anesthesia Start 0902   Anesthesia  Ready 0907   Anesthesia Stop 0954   Epidural to C-Section    Regional Anesthesia Start    Regional Anesthesia Stop    Regional Block Administered         Bernardo Heater, MD  Urology PGY4

## 2021-09-02 NOTE — Plan of Care (Signed)
Problem: Promotion of Perioperative Health and Safety  Goal: Promotion of Health and Safety of the Perioperative Patient  Description: The patient remains safe, receives treatment appropriate to the surgical intervention and patient's physiological needs and is discharged or transferred to the appropriate level of care.    Information below is the current care plan.  Outcome: Progressing  Flowsheets (Taken 09/02/2021 [id number])  Guidelines: PACU  Individualized Interventions/Recommendations #1: reposition and medicate for pain goal

## 2021-09-02 NOTE — Plan of Care (Signed)
Problem: Promotion of Perioperative Health and Safety  Goal: Promotion of Health and Safety of the Perioperative Patient  Description: The patient remains safe, receives treatment appropriate to the surgical intervention and patient's physiological needs and is discharged or transferred to the appropriate level of care.    Information below is the current care plan.  09/02/2021 1050 by Norton Pastel, RN  Outcome: Discharged  09/02/2021 0952 by Norton Pastel, RN  Outcome: Progressing  Flowsheets (Taken 09/02/2021 [phone])  Guidelines: PACU  Individualized Interventions/Recommendations #1: reposition and medicate for pain goal

## 2021-09-02 NOTE — H&P (Signed)
HISTORY & PHYSICAL - INTERVAL ASSESSMENT    **ONLY TO BE USED IN ADDITION TO A HISTORY & PHYSICAL**    Stephen [name]  [id number]      This interval H&P update references the history and physical documentation from this date:  08/26/21    Current Medical Status:  Unchanged    Medications / Allergies:  Unchanged    Review of Systems:  Unchanged    Physical Examination:  BP 115/68 (BP Location: Left arm, BP Patient Position: Sitting)   Pulse 52   Temp 97 F (36.1 C)   Resp 16   Ht 5' 6" (1.676 m)   Wt 90.3 kg (199 lb 1.6 oz)   SpO2 97%   BMI 32.14 kg/m   I have examined the patient today.  Unchanged    Laboratory or Clinical Data:  Lab Results   Component Value Date    CO19 Not Detected 08/15/2020     Lab Results   Component Value Date    NA 139 04/28/2021    K 4.3 04/28/2021    CL 102 04/28/2021    BICARB 24 04/28/2021    BUN 21 04/28/2021    CREAT 1.25 (H) 04/28/2021    GLU 96 04/28/2021    [place] 10.0 04/28/2021     Lab Results   Component Value Date    WBC 5.4 07/30/2021    RBC 4.71 07/30/2021    HGB 14.5 07/30/2021    HCT 42.4 07/30/2021    MCV 90.0 07/30/2021    MCHC 34.2 07/30/2021    RDW 13.8 07/30/2021    PLT 180 07/30/2021    MPV 10.9 07/30/2021    SEG 46 07/30/2021    LYMPHS 41 07/30/2021    MONOS 10 07/30/2021    EOS 3 07/30/2021    BASOS 1 07/30/2021     Lab Results   Component Value Date    AST 27 08/18/2020    ALT 9 08/18/2020    ALK 64 08/18/2020    TP 6.8 08/18/2020    ALB 4.7 08/18/2020    TBILI 1.00 08/18/2020     No results found for: INR, PTT      Modifications of Initial Care Plan:  The care plan has been discussed with the attending physician of record and remains unchanged. This was discussed with the patient today on the morning of the procedure. The patient understands the current plan and verbally consents to the proposed treatment.     Gerardo Territo     09/02/21     8:50 AM

## 2021-09-02 NOTE — Op Note (Signed)
PREOPERATIVE DIAGNOSIS:UTUC (left) bladder lesion    POSTOPERATIVE DIAGNOSIS:UTUC (left) bladder lesion    PROCEDURE PERFORMED:   1. Cystoscopy.  2. Gel mitomycin installation, left kidney.  3. Retrograde pyelogram, left    SURGEON/STAFF: Carlota Raspberry, MD  RESIDENTKenn File     FINDINGS:   1. Left retrograde pyelogram showed dilated renal pelvis, otherwise unremarkable  2. Gel mitomycin installation, unremarkable.    COMPLICATION: None.    BLOOD LOSS: Minimal.    ANESTHESIA: General endotracheal.    ANESTHESIOLOGIST: Please see Anesthesia record.    SPECIMENS: NONE.    COMPLICATIONS: None.    ATTESTATION: I was present for all portions of the case.    POSTOPERATIVE PLAN: The patient will be discharged home and have gel  mitomycin instilled again.     INDICATIONS FOR SURGERY: The patient is a 70 year old male, solitary  kidney, low-grade upper tract urothelial carcinoma, who was counseled  on management options and elected for gel mitomycin installation.  Risks, benefits, and alternatives extensively discussed.     DESCRIPTION OF PROCEDURE: Patient was brought to the operative room, confirmed by name, date of birth, and MRN. Placed on the operating room table. General anesthesia was induced. Appropriate lines were  placed. IV antibiotic (ancef) was given. Placed in dorsal lithotomy position. Extreme care was taken to pad all pressure points.    Prepped and draped in standard sterile fashion. Time-out was  performed. With a 30-degree lens and 21-French sheath, the bladder was surveyed. Left UO was identified, intubated with a Sensor wire, and subsequently an angiographic catheter was advanced over this into the renal pelvis, location confirmed under fluoroscopy. 7 ccs of contrast were injected through angiographic catheter and images were taken of the left renal pelvis, which showed dilation but was otherwise unremarkable. Contrast and urine were aspirated out and 75 ccs  were withdrawn. Cystoscope was removed and gel mitomycin applicator was attached to angiographic catheter. 15 mm of gel mitomycin in a liquid state was  instilled into the renal pelvis over the course of 1 minute and   then the catheter remained in place for 1 minute and then withdrawn.    The patient's bladder was emptied. He was extubated and  taken to PACU in good condition.

## 2021-09-02 NOTE — Interval H&P Note (Deleted)
[facility]  History and Physical - INTERVAL ASSESSMENT  **ONLY TO BE USED IN ADDITION TO A HISTORY & PHYSICAL**      Date Today:  09/02/21     Patient Name:  Stephen [name]  MRN:    [id number]  Room#:   [place] PERIOP/[facility] PERIOP  Attending:  Dr. Carlota Raspberry, MD  Service:   Urology     This interval H&P update references the history and physical documentation from this date:  08/26/21    Vitals:    08/26/21 1208 08/26/21 1215 08/26/21 1230 08/26/21 1415   BP:  114/60 113/60 123/68   BP Location:       BP Patient Position:       Pulse: (!) 49 (!) 48 (!) 44    Resp: '10 10 16    '$ Temp: 97.6 F (36.4 C)      SpO2: 94% 95% 94%    Weight:       Height:           Current Medical Status:  Unchanged    Medications / Allergies:  Unchanged    Review of Systems:  Unchanged    Physical Examination:  Unchanged.   I have personally examined the patient today    Laboratory or Clinical Data:  Unchanged    Modifications of Initial Care Plan:  Unchanged    ASSESSMENT/PLAN:    - Proceed to OR for Jelmyto instillation as planned without changes or modifications     Seen and discussed with attending physician, Dr. Sallyanne Kuster    ___  Bernardo Heater, MD  Urology PGY1  Pager #: [phone]

## 2021-09-02 NOTE — Anesthesia Preprocedure Evaluation (Addendum)
ANESTHESIA PRE-OPERATIVE EVALUATION    Patient Information    Name: Stephen [name]    MRN: [id number]    DOB: November 03, 1951    Age: 70 year old    Sex: male  Procedure(s) with comments:  CYSTOSCOPY, WITH RETROGRADE PYELOGRAM WITH JELMYTO INSTILLATION - with jelmyto, will be done once/week for six weeks consecutive      Pre-op Vitals:   BP 115/68 (BP Location: Left arm, BP Patient Position: Sitting)    Pulse 52    Temp 36.1 C    Resp 16    Ht _0  (1.676 m)    Wt 90.3 kg (199 lb 1.6 oz)    SpO2 97%    BMI 32.14 kg/m    BMI kg/m2: 32.14 kg/m2    Primary language spoken:  English    ROS/Medical History:          General:  positive for Obesity,   Cardiovascular:  negative cardio ROS     Anesthesia History:  history of difficult intubation,  Previous Grade III needed bougie, glidescope used on most recent intubation without issue Pulmonary:   negative pulmonary ROS     Neuro/Psych:   psychiatric history (depression),   Hematology/Oncology:   history of cancer,      GI/Hepatic:  negative GI/hepatic ROS Infectious Disease:  negative for infectious disease     Renal:  chronic renal disease,  Congenital hydronephrosis  Kidney stones  Urethral stricture  Hematuria  S/p nephrectomy    Endocrine/Other:  arthritis,   back pain,     Pregnancy History:   Pediatrics:         Pre Anesthesia Testing (PCC/CPC) notes/comments:                 Physical Exam    Airway:    Inter-inciser distance 3-4 cm  Prognanth Able    Mallampati: III  Neck ROM: full  TM distance: 5-6 cm  Short thick neck: No          Cardiovascular:  - cardiovascular exam normal         Pulmonary:  - pulmonary exam normal           Neuro/Neck/Skeletal/Skin:  - Neck/Neuro/Skeletal/Skin exam normal          Dental:    Comment: Multiple missing and chipped, poor dentition     Abdominal:              Last  OSA (STOP BANG) Score:  No data recorded    Last OSA  (STOP) Score for   Has a physician diagnosed you with sleep apnea?: No  Do you use a CPAP at home?: No  Do you  snore loudly (loud enough to be heard through a closed door)?: 0  Do you often feel tired, fatigued or sleepy during the day?: 0  Has anyone observed that you stop breathing while you are sleeping?: 0  Have you ever been treated for high blood pressure?: 1  OSA total score (A score of 2 or more is high risk. Offer patient sleep study.): 1                   Past Medical History:   Diagnosis Date    Chronic back pain     Congenital hydronephrosis     Gout     Headache     Hematuria     Kidney disease     Kidney stones     Major depressive  disorder, single episode     Polyarthropathy or polyarthritis of multiple sites     Retinal detachment     Urethral stricture      Past Surgical History:   Procedure Laterality Date    CT INSERTION OF SUPRAPUBIC CATH  09/25/2015    NEPHRECTOMY Right 1995    APPENDECTOMY      COLONOSCOPY      CYSTOSCOPY      CYSTOSCOPY W/ LASER LITHOTRIPSY      OTHER SURGICAL HISTORY      Interstim 01/29/2011    SPINE SURGERY  09/21    Lumbar-sacral fusion    TRANSURETHRAL RESECTION OF PROSTATE       Social History     Socioeconomic History    Marital status: Single   Tobacco Use    Smoking status: Never Smoker    Smokeless tobacco: Never Used   Substance and Sexual Activity    Alcohol use: No    Drug use: Not Currently    Sexual activity: Not Currently     Partners: Female   Other Topics Concern    Military Service No    Blood Transfusions Yes    Caffeine Concern No    Occupational Exposure No    Hobby Hazards No    Sleep Concern Yes     Comment: due to my lower back discomfort and leg discomfort    Stress Concern Yes     Comment: Health/ housing/ financial    Weight Concern No    Special Diet No    Back Care Yes     Comment: Am very careful with any activity    Exercises Regularly Yes    Seat Belt Use Yes    Performs Self-Exams Yes    Bike Helmet Use No     Alcohol Use: Not on file       Current Facility-Administered Medications   Medication Dose Route  Frequency Provider Last Rate Last Admin    lactated ringers infusion   IntraVENOUS Continuous Carlota Raspberry, MD 30 mL/hr at 09/02/21 0800 New Bag at 09/02/21 0800    lidocaine (XYLOCAINE) 1% PF injection 0.1 mL  0.1 mL IntraDERMAL Once PRN Carlota Raspberry, MD        mitomycin (JELMYTO) 4 mg/mL pyelocalyceal solution bladder instillation 60 mg  60 mg IntraVESICAL Once Carlota Raspberry, MD         Allergies   Allergen Reactions    Amoxicillin Rash    Sulfa Drugs Unspecified       Labs and Other Data  Lab Results   Component Value Date    NA 139 04/28/2021    K 4.3 04/28/2021    CL 102 04/28/2021    BICARB 24 04/28/2021    BUN 21 04/28/2021    CREAT 1.25 (H) 04/28/2021    GLU 96 04/28/2021    [place] 10.0 04/28/2021     Lab Results   Component Value Date    AST 27 08/18/2020    ALT 9 08/18/2020    ALK 64 08/18/2020    TP 6.8 08/18/2020    ALB 4.7 08/18/2020    TBILI 1.00 08/18/2020     Lab Results   Component Value Date    WBC 5.4 07/30/2021    RBC 4.71 07/30/2021    HGB 14.5 07/30/2021    HCT 42.4 07/30/2021    MCV 90.0 07/30/2021    MCHC 34.2 07/30/2021    RDW 13.8 07/30/2021    PLT 180  07/30/2021    MPV 10.9 07/30/2021    SEG 46 07/30/2021    LYMPHS 41 07/30/2021    MONOS 10 07/30/2021    EOS 3 07/30/2021    BASOS 1 07/30/2021     Lab Results   Component Value Date    INR 1.2 08/18/2020    PTT 28 08/18/2020     Lab Results   Component Value Date    ARTPH 7.42 08/18/2020    ARTPO2 218 (H) 08/18/2020    ARTPCO2 35 (L) 08/18/2020       Anesthesia Plan:  Risks and Benefits of Anesthesia  I have personally performed an appropriate pre-anesthesia physical exam of the patient (including heart, lungs, and airway) prior to the anesthetic and reviewed the pertinent medical history, drug and allergy history, laboratory and imaging studies and consultations.   I have determined that the patient has had adequate assessment and testing.  I have validated the documentation of these elements of the patient exam and/or have made  necessary changes to reflect my own observations during my pre-anesthesia exam.  Anesthetic techniques, invasive monitors, anesthetic drugs for induction, maintenance and post-operative analgesia, risks and alternatives have been explained to the patient and/or patient's representatives.    I have prescribed the anesthetic plan:         Planned anesthesia method: General         ASA 3 (Severe systemic disease)           Planned monitoring method: Routine monitoring    Informed Consent:  Anesthetic plan and risks discussed with Patient.

## 2021-09-02 NOTE — Discharge Instructions (Addendum)
You may resume your normal activity and your normal diet as tolerated.       OK to restart all home medications except aspirin/aleve/ibuprofen, plavix, coumadin, lovenox until notified by your physician at clinic visit.   You may be provided with pyridium for bladder discomfort or bladder spasms. Take this medication as needed. This medication turns your urine Dawson which is expected.      You may see blood in your urine which is normal.This will clear up over time. Drinking lots of water helps clear up urine faster. Seek immediate attention if abdominal pain, passing large clots, urine turns ketch-up color or stops flowing and you cannot empty your bladder accompanied by abdominal pain, fevers >101.4, pain not controlled by pain medication and intractable nausea and vomiting, chest pain or shortness of breath, inability to urinate.    Please return for your next appointment in 1 week to have additional gel mitomycin instillation.

## 2021-09-02 NOTE — Anesthesia Postprocedure Evaluation (Signed)
Anesthesia Post Note    Patient: Stephen [name]    Procedure(s) Performed: Procedure(s) with comments:  CYSTOSCOPY, WITH RETROGRADE PYELOGRAM WITH JELMYTO INSTILLATION - with jelmyto, will be done once/week for six weeks consecutive      Final anesthesia type: General    Patient location: PACU    Post anesthesia pain: adequate analgesia    Mental status: awake, alert  and oriented    Airway Patent: Yes    Last Vitals:   Vitals Value Taken Time   BP 114/62 09/02/21 0955   Temp 36.4 C 09/02/21 0945   Pulse 44 09/02/21 0956   Resp 9 09/02/21 0956   SpO2 98 % 09/02/21 0956   Vitals shown include unvalidated device data.     Post vital signs: stable    Hydration: adequate    N/V:no    Anesthetic complications: no    Plan of care per primary team.

## 2021-09-03 ENCOUNTER — Telehealth (HOSPITAL_BASED_OUTPATIENT_CLINIC_OR_DEPARTMENT_OTHER): Payer: Self-pay

## 2021-09-03 ENCOUNTER — Other Ambulatory Visit: Payer: Self-pay

## 2021-09-03 NOTE — Telephone Encounter (Addendum)
[place] / Medical Oncology Social Work Telephone note:    Current status:  Stephen [name] called to update me that he has an appt with [facility] for a VI-SPDAT housing assessment application.  He will be meeting with their housing coordinator on Monday, 9/19 at 11:00 am.  We talked about him being prepared with certain documents.  I encouraged him that the agency was very easy to work with when I called.  He voiced agreement and expressed feeling positive about their assistance.    Stephen [name] has an appt in Orthopedics at [facility] on Tuesday at 9:00 am for his back.  We agreed to meet in the waiting room 2nd floor of [facility] at 10:30 am, so I can assist with a call to [employer] for transportation grant.    Impression:  Overall, Stephen [name] appears cooperative and motivated to f/u on resource referrals.  He remains open to social work and seems focused on problem solving to improve his psychosocial situation.  Stephen [name] also presents with a good understanding of his medical issues and treatment plan.  He continues to express appreciation for Dr. Victorino December intervention.    Summary/Plan:   Pt to meet with community housing coordinator on 9/19.   SW to meet with pt on 9/20 at 10:30 am to facilitate call to [employer] for grant application.    SW to follow as needed.    Update:  Larene Beach, Education officer, museum from [place], returned my call today.  According to [facility], she and Stephen [name] exchanged voicemails, but she was never able to reach him d/t him not answering his phone.  Due to no contact, she closed his case.  However, Larene Beach is willing to provide support and resrouces as needed.  We discussed that current resource needs are being met to extent possible, and I reassured her that Stephen [name] is able to access needed support from [place] (like transportation).  For now, Estevan Ryder SW will keep case closed unless otherwise indicated.     [name], [place] Social Worker  [phone]

## 2021-09-04 LAB — URINE CULTURE: Urine Culture Result: NO GROWTH

## 2021-09-07 ENCOUNTER — Encounter (HOSPITAL_BASED_OUTPATIENT_CLINIC_OR_DEPARTMENT_OTHER): Payer: Self-pay | Admitting: Nurse Practitioner

## 2021-09-07 ENCOUNTER — Ambulatory Visit (HOSPITAL_BASED_OUTPATIENT_CLINIC_OR_DEPARTMENT_OTHER): Admit: 2021-09-07 | Discharge: 2021-09-07 | Disposition: A | Discharge status: Home Health | Payer: Medicare Other

## 2021-09-07 ENCOUNTER — Encounter (HOSPITAL_BASED_OUTPATIENT_CLINIC_OR_DEPARTMENT_OTHER): Payer: Self-pay

## 2021-09-07 ENCOUNTER — Ambulatory Visit: Payer: Medicare Other | Attending: Nurse Practitioner | Admitting: Nurse Practitioner

## 2021-09-07 DIAGNOSIS — Z981 Arthrodesis status: Secondary | ICD-10-CM

## 2021-09-07 DIAGNOSIS — M5136 Other intervertebral disc degeneration, lumbar region: Secondary | ICD-10-CM

## 2021-09-07 DIAGNOSIS — M4316 Spondylolisthesis, lumbar region: Secondary | ICD-10-CM

## 2021-09-07 NOTE — Progress Notes (Signed)
[place] / Medical Oncology Social Work note:    [name] had an Orthopedic appt today and we agreed to meet afterwards in order for him to apply for a grant.  We met in consult room in Urology clinic.    Assisted with call to [place] to apply for transportation grant.  An application was then e-mailed to me, and [name] gave me permission to fill out on his behalf.  [name] is aware he needs to provide a copy of his Clinical biochemist.    [name] also mentioned he has been to [employer] to fill out housing application.  He is unsure about the type of housing help to be offered through that program.  He was also concerned "how am I going to move?" as all his household belongings are in storage.  He then commented "maybe I should stay" at his sister's, even though it is not optimal.  I provided supportive counseling and active listening.  Encouraged him to make the best decision possible for himself.    Summary/Plan:   [name] thanked me for helping him connect with [facility] and apply for grant.     [name] gave me permission to fill out and submit grant on his behalf.     [name] to provide me with copy his SS award letter, as required for grant app.     SW to follow as needed.    [name], [place] Social Worker  [phone]

## 2021-09-07 NOTE — Progress Notes (Signed)
Division of Spine Surgery    Post Op Progress Note    Date Of Surgery:   08/18/2020    Chief Complaint:   Post Op from ALIF L5-S1    HPI:  Stephen [name] is a 70 year old male who returns for his post operative visit and is doing very well.  He only has some occasional midline lower back discomfort with activity.  He reports no radiating symptoms down his lower extremities.  He has been going to physical therapy once a week.  He no longer wears the back brace.  He is not taking any pain medications.  He denies any fevers or chills.    Interval history: went to see the pain clinic and is planning injections with Dr. [name]. He has pain across the waste and bilateral Si joints. He is currently undergoing chemotherapy for urethral carcinoma. He did not qualify for cannabis program due to insurance.      Physical Exam:  Bilateral Si tenderness  Bilaterally:   Motor: 5/5 all lower extremity groups including: IP/Quad/TA/EHL/GSC   Sensory: Intact to light touch from L1 - S1 Distributions   Reflexes: No clonus noted   Vascular: 2+ DP/PT    Radiographs:   Status post revision lumbar spinal fusion without hardware complication/failure.    Unchanged anterolisthesis of L5 on S1.     Impression:  Progressed DDD above the fusion with possible pain related to that level. Would benefit from facet as well as si injections.     Plan:  Will follow up with the pain clinic for consideration of injections, facet and SI.   F/u in 6 months   Time spend reviewing chart, outside records, communication and imaging studies in preparation for visit, facet -to- face time, counseling, completing order, and documenting encounter: 30  minutes

## 2021-09-08 ENCOUNTER — Other Ambulatory Visit (HOSPITAL_BASED_OUTPATIENT_CLINIC_OR_DEPARTMENT_OTHER): Payer: Self-pay | Admitting: Urology

## 2021-09-08 DIAGNOSIS — Z419 Encounter for procedure for purposes other than remedying health state, unspecified: Secondary | ICD-10-CM

## 2021-09-09 ENCOUNTER — Ambulatory Visit (HOSPITAL_BASED_OUTPATIENT_CLINIC_OR_DEPARTMENT_OTHER): Payer: Medicare Other

## 2021-09-09 ENCOUNTER — Ambulatory Visit
Admission: RE | Admit: 2021-09-09 | Discharge: 2021-09-09 | Disposition: A | Discharge status: Home / Self Care | Payer: Medicare Other | Attending: Urology | Admitting: Urology

## 2021-09-09 ENCOUNTER — Ambulatory Visit (HOSPITAL_BASED_OUTPATIENT_CLINIC_OR_DEPARTMENT_OTHER): Payer: Medicare Other | Admitting: Anesthesiology

## 2021-09-09 ENCOUNTER — Encounter (HOSPITAL_BASED_OUTPATIENT_CLINIC_OR_DEPARTMENT_OTHER): Admission: RE | Disposition: A | Discharge status: Home Health | Payer: Self-pay | Attending: Urology

## 2021-09-09 DIAGNOSIS — N2889 Other specified disorders of kidney and ureter: Secondary | ICD-10-CM

## 2021-09-09 DIAGNOSIS — Z8554 Personal history of malignant neoplasm of ureter: Secondary | ICD-10-CM | POA: Insufficient documentation

## 2021-09-09 DIAGNOSIS — Z419 Encounter for procedure for purposes other than remedying health state, unspecified: Secondary | ICD-10-CM

## 2021-09-09 DIAGNOSIS — Z6831 Body mass index (BMI) 31.0-31.9, adult: Secondary | ICD-10-CM | POA: Insufficient documentation

## 2021-09-09 DIAGNOSIS — Z981 Arthrodesis status: Secondary | ICD-10-CM | POA: Insufficient documentation

## 2021-09-09 DIAGNOSIS — C679 Malignant neoplasm of bladder, unspecified: Secondary | ICD-10-CM

## 2021-09-09 DIAGNOSIS — Z85528 Personal history of other malignant neoplasm of kidney: Secondary | ICD-10-CM | POA: Insufficient documentation

## 2021-09-09 DIAGNOSIS — Z882 Allergy status to sulfonamides status: Secondary | ICD-10-CM | POA: Insufficient documentation

## 2021-09-09 DIAGNOSIS — M5136 Other intervertebral disc degeneration, lumbar region: Secondary | ICD-10-CM | POA: Insufficient documentation

## 2021-09-09 DIAGNOSIS — E669 Obesity, unspecified: Secondary | ICD-10-CM | POA: Insufficient documentation

## 2021-09-09 DIAGNOSIS — Z905 Acquired absence of kidney: Secondary | ICD-10-CM | POA: Insufficient documentation

## 2021-09-09 DIAGNOSIS — Z880 Allergy status to penicillin: Secondary | ICD-10-CM | POA: Insufficient documentation

## 2021-09-09 DIAGNOSIS — M4317 Spondylolisthesis, lumbosacral region: Secondary | ICD-10-CM | POA: Insufficient documentation

## 2021-09-09 LAB — BASIC METABOLIC PANEL, BLOOD
Anion Gap: 11 mmol/L (ref 7–15)
BUN: 19 mg/dL (ref 8–23)
Bicarbonate: 25 mmol/L (ref 22–29)
Calcium: 8.9 mg/dL (ref 8.5–10.6)
Chloride: 103 mmol/L (ref 98–107)
Creatinine: 1.28 mg/dL — ABNORMAL HIGH (ref 0.67–1.17)
GFR: 56 mL/min
Glucose: 130 mg/dL — ABNORMAL HIGH (ref 70–99)
Potassium: 4 mmol/L (ref 3.5–5.1)
Sodium: 139 mmol/L (ref 136–145)
eGFR Based on CKD-EPI 2021 Equation: >60 mL/min

## 2021-09-09 SURGERY — CYSTOSCOPY, WITH RETROGRADE PYELOGRAM
Anesthesia: General | Site: Urethra | Wound class: Class II (Clean Contaminated)

## 2021-09-09 MED ORDER — OXYCODONE HCL 5 MG OR TABS
5.0000 mg | ORAL_TABLET | Freq: Once | ORAL | Status: DC | PRN
Start: 2021-09-09 — End: 2021-09-09

## 2021-09-09 MED ORDER — FENTANYL CITRATE (PF) 250 MCG/5ML IJ SOLN
INTRAMUSCULAR | Status: DC | PRN
Start: 2021-09-09 — End: 2021-09-09
  Administered 2021-09-09 (×2): 50 ug via INTRAVENOUS

## 2021-09-09 MED ORDER — EPHEDRINE SULFATE (PRESSORS) 50 MG/ML IV SOLN
INTRAVENOUS | Status: DC | PRN
Start: 2021-09-09 — End: 2021-09-09
  Administered 2021-09-09 (×2): 10 mg via INTRAVENOUS

## 2021-09-09 MED ORDER — LACTATED RINGERS IV SOLN
INTRAVENOUS | Status: DC
Start: 2021-09-09 — End: 2021-09-09

## 2021-09-09 MED ORDER — NALOXONE HCL 0.4 MG/ML IJ SOLN
0.1000 mg | INTRAMUSCULAR | Status: DC | PRN
Start: 2021-09-09 — End: 2021-09-09

## 2021-09-09 MED ORDER — MIDAZOLAM HCL 2 MG/2ML IJ SOLN
INTRAMUSCULAR | Status: AC
Start: 2021-09-09 — End: ?
  Filled 2021-09-09: qty 2

## 2021-09-09 MED ORDER — CEFAZOLIN SODIUM 1 GM IJ SOLR
INTRAMUSCULAR | Status: DC | PRN
Start: 2021-09-09 — End: 2021-09-09
  Administered 2021-09-09 (×2): 2000 mg via INTRAVENOUS

## 2021-09-09 MED ORDER — MIDAZOLAM HCL 2 MG/2ML IJ SOLN
INTRAMUSCULAR | Status: DC | PRN
Start: 2021-09-09 — End: 2021-09-09
  Administered 2021-09-09 (×2): 2 mg via INTRAVENOUS

## 2021-09-09 MED ORDER — ONDANSETRON HCL 4 MG/2ML IV SOLN
INTRAMUSCULAR | Status: DC | PRN
Start: 2021-09-09 — End: 2021-09-09
  Administered 2021-09-09 (×2): 4 mg via INTRAVENOUS

## 2021-09-09 MED ORDER — FENTANYL CITRATE (PF) 100 MCG/2ML IJ SOLN
INTRAMUSCULAR | Status: AC
Start: 2021-09-09 — End: ?
  Filled 2021-09-09: qty 2

## 2021-09-09 MED ORDER — ONDANSETRON HCL 4 MG/2ML IV SOLN
4.0000 mg | Freq: Once | INTRAMUSCULAR | Status: DC | PRN
Start: 2021-09-09 — End: 2021-09-09

## 2021-09-09 MED ORDER — DIPHENHYDRAMINE HCL 50 MG/ML IJ SOLN
12.5000 mg | Freq: Once | INTRAMUSCULAR | Status: DC | PRN
Start: 2021-09-09 — End: 2021-09-09

## 2021-09-09 MED ORDER — HYDROMORPHONE HCL 1 MG/ML IJ SOLN
0.5000 mg | INTRAMUSCULAR | Status: DC | PRN
Start: 2021-09-09 — End: 2021-09-09

## 2021-09-09 MED ORDER — MITOMYCIN (JELMYTO) 4 MG/ML PYELOCALYCEAL SOLUTION BLADDER INSTILLATION
INTRAVESICAL | Status: DC | PRN
Start: 2021-09-09 — End: 2021-09-09
  Administered 2021-09-09 (×2): 60 mg via INTRAVESICAL

## 2021-09-09 MED ORDER — ACETAMINOPHEN 325 MG PO TABS
975.0000 mg | ORAL_TABLET | Freq: Once | ORAL | Status: AC
Start: 2021-09-09 — End: 2021-09-09
  Administered 2021-09-09 (×2): 975 mg via ORAL
  Filled 2021-09-09: qty 3

## 2021-09-09 MED ORDER — LACTATED RINGERS IV SOLN
INTRAVENOUS | Status: DC | PRN
Start: 2021-09-09 — End: 2021-09-09

## 2021-09-09 MED ORDER — LIDOCAINE HCL (PF) 1 % IJ SOLN
0.1000 mL | Freq: Once | INTRAMUSCULAR | Status: DC | PRN
Start: 2021-09-09 — End: 2021-09-09

## 2021-09-09 MED ORDER — DEXAMETHASONE SODIUM PHOSPHATE 4 MG/ML IJ SOLN (CUSTOM)
INTRAMUSCULAR | Status: DC | PRN
Start: 2021-09-09 — End: 2021-09-09
  Administered 2021-09-09 (×2): 6 mg via INTRAVENOUS

## 2021-09-09 MED ORDER — FENTANYL CITRATE (PF) 50 MCG/ML IJ SOLN (WRAPPED RECORD) ~~LOC~~
25.0000 ug | INTRAMUSCULAR | Status: DC | PRN
Start: 2021-09-09 — End: 2021-09-09

## 2021-09-09 MED ORDER — MITOMYCIN (JELMYTO) 4 MG/ML PYELOCALYCEAL SOLUTION BLADDER INSTILLATION
60.0000 mg | Freq: Once | INTRAVESICAL | Status: DC
Start: 2021-09-09 — End: 2021-09-09
  Filled 2021-09-09: qty 15

## 2021-09-09 MED ORDER — FENTANYL CITRATE (PF) 50 MCG/ML IJ SOLN (WRAPPED RECORD) ~~LOC~~
50.0000 ug | INTRAMUSCULAR | Status: DC | PRN
Start: 2021-09-09 — End: 2021-09-09

## 2021-09-09 MED ORDER — PROPOFOL 200 MG/20ML IV EMUL
INTRAVENOUS | Status: DC | PRN
Start: 2021-09-09 — End: 2021-09-09
  Administered 2021-09-09 (×2): 200 mg via INTRAVENOUS

## 2021-09-09 MED ORDER — LIDOCAINE HCL 2 % IJ SOLN WRAPPED RECORD
INTRAMUSCULAR | Status: DC | PRN
Start: 2021-09-09 — End: 2021-09-09
  Administered 2021-09-09 (×2): 80 mg via INTRAVENOUS

## 2021-09-09 SURGICAL SUPPLY — 20 items
BAG CYSTO DRAIN SKYTRON TABLE (Misc Surgical Supply)
CATHETER URETERAL OPEN END 5FR X 70CM (Procedural wires/sheaths/catheters/balloons/dilators) ×2 IMPLANT
DRAPE URO CATCHER BAG-STERILE (Drapes/towels) ×2 IMPLANT
GLOVE BIOGEL PI INDICATOR SIZE 8.5 (Gloves/Gowns) ×2 IMPLANT
GLOVE SURGEON BIOGEL SIZE 7.5 (Gloves/Gowns) ×2
GLOVE SURGICAL BIOGEL SIZE 8 (Gloves/Gowns) ×2 IMPLANT
GOWN SURGICAL ULTRA XL BLUE, AAMI LVL 3 (Gloves/Gowns) ×2
GUIDEWIRE SENSOR DUAL FLEX ANGLED TIP .035 X 150CM NITINOL (Procedural wires/sheaths/catheters/balloons/dilators) ×2 IMPLANT
GUIDEWIRE SENSOR DUAL FLEX STRAIGHT TIP .035 X 150CM NITINOL (Procedural wires/sheaths/catheters/balloons/dilators)
GUIDEWIRE SENSOR STRAIGHT .038 X 150CMNITINOL (Procedural wires/sheaths/catheters/balloons/dilators) ×2 IMPLANT
PAD ARMBOARD CONV FOAM 2X8X20 (Patient Care Supply) ×2
SCRUB, SURGICAL, EXIDINE, 4%, CHG, 4OZ (Prep Solutions) ×2
SLEEVE SCD KNEE MEDIUM (Patient Care Supply) ×2 IMPLANT
SOLUTION IRR BAG .9% N/S 3000ML (Non-Pharmacy Meds/Solutions) IMPLANT
SOLUTION IRR POUR BTL 0.9% NS 1000ML (Non-Pharmacy Meds/Solutions)
SURGICAL PACK CYSTO - SAME DAY (Procedure Packs/kits) ×2
SYRINGE HYPO LL 30CC (Needles/punch/cannula/biopsy) ×2 IMPLANT
TRAY FOLEY SURESTEP LUBRI-SIL I.C.16FR URIMETER, LF (Lines/Drains) IMPLANT
TUBING SUCTION MEDI-VAC 9/32" X 20' (Tubing/Suction) ×2
UNDERPAD 30X36 (Patient Care Supply) ×2 IMPLANT

## 2021-09-09 NOTE — H&P (Signed)
UROLOGY HISTORY AND PHYSICAL    Chief complaint:  UTUC    History of Present Illness:     Stephen [name] is a 70 year old male with UTUC s/p URS.    Past Medical and Surgical History:  Past Medical History:   Diagnosis Date    Chronic back pain     Congenital hydronephrosis     Gout     Headache     Hematuria     Kidney disease     Kidney stones     Major depressive disorder, single episode     Polyarthropathy or polyarthritis of multiple sites     Retinal detachment     Urethral stricture      Past Surgical History:   Procedure Laterality Date    CT INSERTION OF SUPRAPUBIC CATH  09/25/2015    NEPHRECTOMY Right 1995    APPENDECTOMY      COLONOSCOPY      CYSTOSCOPY      CYSTOSCOPY W/ LASER LITHOTRIPSY      OTHER SURGICAL HISTORY      Interstim 01/29/2011    SPINE SURGERY  09/21    Lumbar-sacral fusion    TRANSURETHRAL RESECTION OF PROSTATE         Allergies:  Allergies   Allergen Reactions    Amoxicillin Rash    Sulfa Drugs Unspecified       Medications:  Current Facility-Administered Medications   Medication    lactated ringers infusion    lidocaine (XYLOCAINE) 1% PF injection 0.1 mL    mitomycin (JELMYTO) 4 mg/mL pyelocalyceal solution bladder instillation 60 mg       Social History:  Social History     Socioeconomic History    Marital status: Single   Tobacco Use    Smoking status: Never Smoker    Smokeless tobacco: Never Used   Substance and Sexual Activity    Alcohol use: No    Drug use: Not Currently    Sexual activity: Not Currently     Partners: Female   Social Activities of Daily Living Present    Military Service No    Blood Transfusions Yes    Caffeine Concern No    Occupational Exposure No    Hobby Hazards No    Sleep Concern Yes     Comment: due to my lower back discomfort and leg discomfort    Stress Concern Yes     Comment: Health/ housing/ financial    Weight Concern No    Special Diet No    Back Care Yes     Comment: Am very careful with any activity     Exercises Regularly Yes    Seat Belt Use Yes    Performs Self-Exams Yes    Bike Helmet Use No       Family History:  Family History   Adopted: Yes   Family history unknown: Yes       Review of Systems:  Constitutional: denies fatigue, night sweats, weight loss, fever.  Eyes: denies changes in vision  Ears, Nose, Mouth, Throat: denies difficulty swallowing, sore throat, nosebleeds, rhinorrhea.  CV: denies palpitations, chest pain, lower extremity edema, or claudication.  Resp: denies productive cough, shortness of breath, wheezing, dyspnea on exertion.  GI: denies heartburn, hematemesis, bleeding from rectum, melena, jaundice, constipation or diarrhea.  GU: Per HPI  Musculoskeletal: denies joint stiffness, joint swelling, joint pain, joint redness, back pain, or muscle pain.  Neuro: denies confusion, loss of consciousness, headaches,  paralysis/weakness, numbness or tingling.  Endo: denies symptoms of diabetes  Heme/Lymphatic: denies abnormal bleeding, bruising, or swollen lymph nodes.    Physical Exam:  BP 126/64 (BP Location: Left arm, BP Patient Position: Sitting)    Pulse 51    Temp 97 F (36.1 C)    Resp 16    Ht '5\' 6"'  (1.676 m)    Wt 89.7 kg (197 lb 12.8 oz)    SpO2 96%    BMI 31.93 kg/m   GEN: NAD, awake, alert/oriented   HEENT: EOMI, atraumatic  CARDIO: Regular rate and rhythm, warm extremities  PULM: Non-labored breathing on room air, speaking in full sentences  GI: Abdomen soft, non-distended, non-tender  GU: Deferred  SKIN: warm and dry  NEURO: No gross neurological deficits      Labs and Other Data:  No results found for: NA, K, CL, BICARB, BUN, CREAT, GLU, Ferron  No results found for: WBC, HGB, HCT, PLT, SEG, BAND, LYMPHS, MONOS, EOS, NRBC  No results found for: AST, ALT, ALK, TBILI, DBILI, TP, ALB  No results found for: INR, PTT      Imaging:  X-Ray Fluoroscopy Up To 1 Hr - OR    Result Date: 09/02/2021  Narrative: This exam was to provide Fluoroscopic Guidance.  No Radiologist interpretation will be  given.    X-Ray Fluoroscopy Up To 1 Hr - OR    Result Date: 08/26/2021  Narrative: This exam was to provide Fluoroscopic Guidance.  No Radiologist interpretation will be given.    X-Ray Fluoroscopy Up To 1 Hr - OR    Result Date: 08/19/2021  Narrative: This exam was to provide Fluoroscopic Guidance.  No Radiologist interpretation will be given.    X-Ray Fluoroscopy Up To 1 Hr - OR    Result Date: 08/12/2021  Narrative: This exam was to provide Fluoroscopic Guidance.  No Radiologist interpretation will be given.    X-Ray Lumbosacral Spine 2 Or 3 Views    Result Date: 09/07/2021  Narrative: EXAM DESCRIPTION: X-RAY LUMBOSACRAL SPINE 2 OR 3 VIEWS CLINICAL HISTORY:  S/P lumbar fusion TECHNIQUE: Two views lumbosacral spine COMPARISON: Lumbosacral spine radiographs 05/27/2021 FINDINGS: Redemonstration of posterior instrumented fusion from L3-S1, with interbody cages at L3-L4, L4-L5 and L5-S1. No evidence of hardware complication. Alignment is unchanged. Unchanged grade 1 retrolisthesis of L2 on L3 with moderate degenerative disc disease. Similar 10 mm, grade 2 anterolisthesis of L5 on S1, unchanged. There is no acute osseous abnormality. 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 Unchanged moderate degenerative disc disease at L2-L3 with grade 1 retrolisthesis of L2 on L3.      Assessment and Plan:  70 year old male with UTUC s/p URS presenting for repeat procedure (Jelmyto instillation)    - Proceed to OR for planned procedure    Discussed with attending Dr. Merril Abbe, MD  Urology PGY1    09/09/21, 9:29 AM

## 2021-09-09 NOTE — Op Note (Signed)
PREOPERATIVE DIAGNOSIS:UTUC (left) bladder lesion    POSTOPERATIVE DIAGNOSIS:UTUC (left) bladder lesion    PROCEDURE PERFORMED:   1. Cystoscopy.  2. Gel mitomycin installation, left kidney.  3.Retrograde pyelogram, left    SURGEON/STAFF: Carlota Raspberry, MD  RESIDENTKenn File     FINDINGS:   1.Left retrograde pyelogram showed dilated renal pelvis, otherwise unremarkable  2. Gel mitomycin installation, unremarkable.    COMPLICATION: None.    BLOOD LOSS: Minimal.    ANESTHESIA: General endotracheal.    ANESTHESIOLOGIST: Please see Anesthesia record.    SPECIMENS: NONE.    COMPLICATIONS: None.    ATTESTATION: I was present for all portions of the case.    POSTOPERATIVE PLAN: The patient will be discharged home and have gel  mitomycin instilled again.     INDICATIONS FOR SURGERY: The patient is a 70 year old male, solitary  kidney, low-grade upper tract urothelial carcinoma, who was counseled  on management options and elected for gel mitomycin installation.  Risks, benefits, and alternatives extensively discussed.     DESCRIPTION OF PROCEDURE: Patient was brought to the operative room, confirmed by name, date of birth, and MRN. Placed on the operating room table. General anesthesia was induced. Appropriate lines were  placed. IV antibiotic (ancef) was given. Placed in dorsal lithotomy position. Extreme care was taken to pad all pressure points.    Prepped and draped in standard sterile fashion. Time-out was  performed. With a 30-degree lens and 21-French sheath, the bladder was surveyed. Left UO was identified, intubated with a Sensor wire, and subsequently an angiographic catheter was advanced over this into the renal pelvis, location confirmed under fluoroscopy. Gel mitomycin applicator was attached to angiographic catheter. 15 mm of gel mitomycin in a liquid state was  instilled into the renal pelvis over the course of 1 minute and   then the catheter remained in  place for 1 minute and then withdrawn.    The patient's bladder was emptied. He was extubated and  taken to PACU in good condition.

## 2021-09-09 NOTE — Anesthesia Preprocedure Evaluation (Signed)
ANESTHESIA PRE-OPERATIVE EVALUATION    Patient Information    Name: Stephen [name]    MRN: [id number]    DOB: 1951/06/12    Age: 70 year old    Sex: male  Procedure(s) with comments:  CYSTOSCOPY, WITH RETROGRADE PYELOGRAM WITH JELMYTO INSTILLATION - with jelmyto, will be done once/week for six weeks consecutive      Pre-op Vitals:   BP 126/64 (BP Location: Left arm, BP Patient Position: Sitting)    Pulse 51    Temp 36.1 C    Resp 16    Ht 5' 6" (1.676 m)    Wt 89.7 kg (197 lb 12.8 oz)    SpO2 96%    BMI 31.93 kg/m    BMI kg/m2: 31.93 kg/m2    Primary language spoken:  English    ROS/Medical History:          General:  positive for Obesity,  not able to dress/bathe self,   Cardiovascular:  negative cardio ROS     Anesthesia History:  history of difficult intubation,  Previous G3V needing bougie w/ DL intubation   H/o easy glidescope intubation Pulmonary:   negative pulmonary ROS     Neuro/Psych:   psychiatric history (depression),   Hematology/Oncology:   history of cancer,      GI/Hepatic:  negative GI/hepatic ROS Infectious Disease:  negative for infectious disease     Renal:  chronic renal disease, CRI,  Congenital hydronephrosis  Kidney stones  Urethral stricture  Hematuria  S/p nephrectomy    Endocrine/Other:  arthritis,   back pain,     Pregnancy History:   Pediatrics:         Pre Anesthesia Testing (PCC/CPC) notes/comments:                 Physical Exam    Airway:    Inter-inciser distance 3-4 cm  Prognanth Able    Mallampati: III  Neck ROM: full  TM distance: 5-6 cm  Short thick neck: No          Cardiovascular:  - cardiovascular exam normal         Pulmonary:  - pulmonary exam normal           Neuro/Neck/Skeletal/Skin:  - Neck/Neuro/Skeletal/Skin exam normal          Dental:  - normal exam    Abdominal:              Last  OSA (STOP BANG) Score:  No data recorded    Last OSA  (STOP) Score for   Has a physician diagnosed you with sleep apnea?: No  Do you use a CPAP at home?: No  Do you snore loudly (loud  enough to be heard through a closed door)?: 0  Do you often feel tired, fatigued or sleepy during the day?: 0  Has anyone observed that you stop breathing while you are sleeping?: 0  Have you ever been treated for high blood pressure?: 1  OSA total score (A score of 2 or more is high risk. Offer patient sleep study.): 1                   Past Medical History:   Diagnosis Date    Chronic back pain     Congenital hydronephrosis     Gout     Headache     Hematuria     Kidney disease     Kidney stones     Major depressive  disorder, single episode     Polyarthropathy or polyarthritis of multiple sites     Retinal detachment     Urethral stricture      Past Surgical History:   Procedure Laterality Date    CT INSERTION OF SUPRAPUBIC CATH  09/25/2015    NEPHRECTOMY Right 1995    APPENDECTOMY      COLONOSCOPY      CYSTOSCOPY      CYSTOSCOPY W/ LASER LITHOTRIPSY      OTHER SURGICAL HISTORY      Interstim 01/29/2011    SPINE SURGERY  09/21    Lumbar-sacral fusion    TRANSURETHRAL RESECTION OF PROSTATE       Social History     Socioeconomic History    Marital status: Single   Tobacco Use    Smoking status: Never Smoker    Smokeless tobacco: Never Used   Substance and Sexual Activity    Alcohol use: No    Drug use: Not Currently    Sexual activity: Not Currently     Partners: Female   Other Topics Concern    Military Service No    Blood Transfusions Yes    Caffeine Concern No    Occupational Exposure No    Hobby Hazards No    Sleep Concern Yes     Comment: due to my lower back discomfort and leg discomfort    Stress Concern Yes     Comment: Health/ housing/ financial    Weight Concern No    Special Diet No    Back Care Yes     Comment: Am very careful with any activity    Exercises Regularly Yes    Seat Belt Use Yes    Performs Self-Exams Yes    Bike Helmet Use No     Alcohol Use: Not on file       Current Facility-Administered Medications   Medication Dose Route Frequency Provider Last  Rate Last Admin    acetaminophen (TYLENOL) tablet 975 mg  975 mg Oral Once Carlota Raspberry, MD        lactated ringers infusion   IntraVENOUS Continuous Carlota Raspberry, MD        lidocaine (XYLOCAINE) 1% PF injection 0.1 mL  0.1 mL IntraDERMAL Once PRN Carlota Raspberry, MD         Allergies   Allergen Reactions    Amoxicillin Rash    Sulfa Drugs Unspecified       Labs and Other Data  Lab Results   Component Value Date    NA 139 04/28/2021    K 4.3 04/28/2021    CL 102 04/28/2021    BICARB 24 04/28/2021    BUN 21 04/28/2021    CREAT 1.25 (H) 04/28/2021    GLU 96 04/28/2021     10.0 04/28/2021     Lab Results   Component Value Date    AST 27 08/18/2020    ALT 9 08/18/2020    ALK 64 08/18/2020    TP 6.8 08/18/2020    ALB 4.7 08/18/2020    TBILI 1.00 08/18/2020     Lab Results   Component Value Date    WBC 5.4 07/30/2021    RBC 4.71 07/30/2021    HGB 14.5 07/30/2021    HCT 42.4 07/30/2021    MCV 90.0 07/30/2021    MCHC 34.2 07/30/2021    RDW 13.8 07/30/2021    PLT 180 07/30/2021    MPV 10.9 07/30/2021    SEG  46 07/30/2021    LYMPHS 41 07/30/2021    MONOS 10 07/30/2021    EOS 3 07/30/2021    BASOS 1 07/30/2021     Lab Results   Component Value Date    INR 1.2 08/18/2020    PTT 28 08/18/2020     Lab Results   Component Value Date    ARTPH 7.42 08/18/2020    ARTPO2 218 (H) 08/18/2020    ARTPCO2 35 (L) 08/18/2020       Anesthesia Plan:  Risks and Benefits of Anesthesia  I have personally performed an appropriate pre-anesthesia physical exam of the patient (including heart, lungs, and airway) prior to the anesthetic and reviewed the pertinent medical history, drug and allergy history, laboratory and imaging studies and consultations.   I have determined that the patient has had adequate assessment and testing.  I have validated the documentation of these elements of the patient exam and/or have made necessary changes to reflect my own observations during my pre-anesthesia exam.  Anesthetic techniques, invasive  monitors, anesthetic drugs for induction, maintenance and post-operative analgesia, risks and alternatives have been explained to the patient and/or patient's representatives.    I have prescribed the anesthetic plan:         Planned anesthesia method: General         ASA 3 (Severe systemic disease)           Planned monitoring method: Routine monitoring    Informed Consent:  Anesthetic plan and risks discussed with Patient.

## 2021-09-09 NOTE — Brief Op Note (Signed)
BRIEF OPERATIVE NOTE     CASE ID: [id number]    DATE: 09/09/2021  TIME: 11:13 AM    Preoperative Diagnosis:  Malignant neoplasm of urinary bladder, unspecified site (CMS-HCC) [C67.9]  Postoperative Diagnosis: Same    Procedure:  Procedure(s):  CYSTOSCOPY, WITH RETROGRADE PYELOGRAM WITH JELMYTO INSTILLATION - Wound Class: Class II (Clean Contaminated) - Incision Closure: No Incision / NA     Surgeons:  Primary: Carlota Raspberry, MD  Resident - Assisting: Bernardo Heater, MD    Anesthesia:   Anesthesiologist: Johney Maine, MD     OR Staff  Circulator: Starr Sinclair, RN  Scrub: Roxanna Mew    Findings: L UO cannulated, advanced catheter, injected gel mitomycin with applicator    Wound classification :  Procedure(s):  CYSTOSCOPY, WITH RETROGRADE PYELOGRAM WITH JELMYTO INSTILLATION - Wound Class: Class II (Clean Contaminated) - Incision Closure: No Incision / NA    Wound closure status:  Procedure(s):  CYSTOSCOPY, WITH RETROGRADE PYELOGRAM WITH JELMYTO INSTILLATION - Wound Class: Class II (Clean Contaminated) - Incision Closure: No Incision / NA    Specimens: * No specimens in log *    Implants: * No implants in log *    Antibiotics: ancef    DVT Prophylaxis: SCD    Chemo Prophylaxis: No; Clarify why brief procedure    Fluids/Blood Products:    IV Fluids: 400 cc    Blood Products: None    EBL: 0 cc    Urine Output: Not Recorded     Complications: None    Dispo: PACU then home    Event Time In   In [place]   Pre Procedure Start 0832   PreOp Nurse Complete 0915   Pre Procedure Tasks Complete 0934   Room Setup Start 1014   Room Ready for Patient 1020   In Room 1033   Incision 1053   Closing Started 1110   Closing Complete 1110   Out of Room    Room Cleanup Start    Room Cleanup End    In PACU    PACU Criteria Complete    In Recovery    Recovery Criteria Complete    Ready For Visitors    Anesthesia Start 1033   Anesthesia Ready 1046   Anesthesia Stop    Epidural to C-Section    Regional Anesthesia Start     Regional Anesthesia Stop    Regional Block Administered         C. Lianne Moris, MD  Urology PGY4

## 2021-09-09 NOTE — Discharge Instructions (Addendum)
Diagnosis and Reason for Admission    You were admitted to the hospital for the following reason(s): Urothelial carcinoma    Your full diagnosis list is located on this After Visit Summary in the Hospital Problems section.    What [facility] Stay    The main tests and treatments done for you during this hospitalization were:    Cytoscopy and gel mitomycin instillation    The following evaluation is still important to complete after discharge from the hospital:  none    Instructions for After Discharge    Your diet at home should be a regular diet.    Your activity level at home should be:  As tolerated    Specific activity restrictions:    Do not drive while taking narcotic pain medications.    Wound or tube care instructions:  Ok to shower and bathe    Your medication list is located on this After Visit Summary in the Current Discharge Medication List section.  Your nurse will review this information with you before you leave the hospital.    Reasons to Contact a Doctor Urgently    Call or go to ED if:  Increased or uncontrolled pain.  Severe abdominal pain.  Nausea and vomiting.  Discharge or leakage from the surgical (or wound) site.  Fevers or chills.  Shortness of breath or difficulty breathing.  Chest pains or palpitations.  Inability to urinate    If you have any questions about your hospital care, your medications, or if you have new or concerning symptoms soon after going home from the hospital, and you need to contact your hospital physician, your hospital physician can be contacted in the following manner:  [facility] operator at [phone].    Once you are able to see your primary care physician (PCP), your PCP will then be responsible for further medication refills, or appointment referrals.    What Needs to Happen Next After Discharge -- Appointments and Follow Up    Any appointments already scheduled at [facility] will be listed in the Future Appointments section at the  top of this After Visit Summary.  Any appointments that have been requested, but have not yet been scheduled, will be listed below that under Orders After Discharge.    Sometimes tests performed in the hospital do not yet have results by the time a patient goes home.  The following key tests will need to be followed up at your next appointment: None    Additional Information for You from your Case Manager and/or Social Worker       Additional Information for You from your Discharging Nurse    Follow up with Dr. Sallyanne Kuster, someone will call you regarding an appointment

## 2021-09-09 NOTE — Anesthesia Postprocedure Evaluation (Signed)
Anesthesia Post Note    Patient: Stephen [name]    Procedure(s) Performed: Procedure(s) with comments:  CYSTOSCOPY, WITH RETROGRADE PYELOGRAM WITH JELMYTO INSTILLATION - with jelmyto, will be done once/week for six weeks consecutive      Final anesthesia type: General    Patient location: PACU    Post anesthesia pain: adequate analgesia    Mental status: awake, alert  and oriented    Airway Patent: Yes    Last Vitals:   Vitals Value Taken Time   BP 119/64 09/09/21 1120   Temp 36.7 09/09/21 1129   Pulse 62 09/09/21 1128   Resp 10 09/09/21 1128   SpO2 95 % 09/09/21 1128   Vitals shown include unvalidated device data.     Post vital signs: stable    Hydration: adequate    N/V:no    Anesthetic complications: no    Plan of care per primary team.

## 2021-09-09 NOTE — Plan of Care (Signed)
Problem: Promotion of Perioperative Health and Safety  Goal: Promotion of Health and Safety of the Perioperative Patient  Description: The patient remains safe, receives treatment appropriate to the surgical intervention and patient's physiological needs and is discharged or transferred to the appropriate level of care.    Information below is the current care plan.  Outcome: Progressing  Flowsheets (Taken 09/09/2021 1113)  Individualized Interventions/Recommendations #1: pain control

## 2021-09-10 ENCOUNTER — Telehealth (INDEPENDENT_AMBULATORY_CARE_PROVIDER_SITE_OTHER): Payer: Self-pay

## 2021-09-10 NOTE — Telephone Encounter (Signed)
New Patient Pre-Visit Planning    Mychart offered: Yes    Establish Visit Provider: Jeanie Sewer, MD    Establish Visit Date: 11/01/2021    Agenda Setting:    1) new/discuss medical issues    2) n/a   3) n/a      Send to:     [id number] - [place], [place]  [place] [address]  Phone: [phone] Fax: [phone]        Previous Medical Provider/Facility:    n/a

## 2021-09-11 LAB — URINE CULTURE: Urine Culture Result: NO GROWTH

## 2021-09-13 ENCOUNTER — Encounter (HOSPITAL_BASED_OUTPATIENT_CLINIC_OR_DEPARTMENT_OTHER): Payer: Self-pay | Admitting: Urology

## 2021-09-13 ENCOUNTER — Other Ambulatory Visit (HOSPITAL_BASED_OUTPATIENT_CLINIC_OR_DEPARTMENT_OTHER): Payer: Self-pay | Admitting: Physician Assistant

## 2021-09-13 DIAGNOSIS — C662 Malignant neoplasm of left ureter: Secondary | ICD-10-CM

## 2021-09-13 DIAGNOSIS — Z981 Arthrodesis status: Secondary | ICD-10-CM

## 2021-09-14 ENCOUNTER — Encounter (HOSPITAL_BASED_OUTPATIENT_CLINIC_OR_DEPARTMENT_OTHER): Payer: Self-pay | Admitting: Hospital

## 2021-09-14 DIAGNOSIS — C662 Malignant neoplasm of left ureter: Secondary | ICD-10-CM

## 2021-09-14 MED ORDER — PREGABALIN 75 MG OR CAPS
ORAL_CAPSULE | ORAL | 0 refills | Status: DC
Start: 2021-09-14 — End: 2021-10-05

## 2021-09-14 NOTE — Telephone Encounter (Signed)
Received msg from Dr. Sallyanne Kuster    ===View-only below this line===  ----- Message -----  From: Carlota Raspberry, MD  Sent: 09/13/2021   2:50 PM PDT  To: Artis Delay, LVN, Lucianne Muss  Subject: needs to see me in 6 weeks with MR urogram p*      ----- Message -----  From: Electronic Interface To Epic, [facility] Lab Results  Sent: 09/10/2021  12:11 PM PDT  To: Carlota Raspberry, MD

## 2021-09-15 NOTE — Telephone Encounter (Signed)
Routing to Dr. Sallyanne Kuster to advise if MRI is appropriate for pt since he has a hardware in his back per patient.

## 2021-09-16 ENCOUNTER — Other Ambulatory Visit (HOSPITAL_BASED_OUTPATIENT_CLINIC_OR_DEPARTMENT_OTHER): Payer: Self-pay | Admitting: Urology

## 2021-09-16 ENCOUNTER — Telehealth (HOSPITAL_BASED_OUTPATIENT_CLINIC_OR_DEPARTMENT_OTHER): Payer: Self-pay

## 2021-09-16 DIAGNOSIS — C679 Malignant neoplasm of bladder, unspecified: Secondary | ICD-10-CM

## 2021-09-16 NOTE — Telephone Encounter (Signed)
[place] / Medical Oncology Social Work Telephone note:    Outgoing call to [employer] [phone]) to verify if they received [name]'s application.  They did not have it on file, as they are unable to un-encrypt my e-mail.    Application was then faxed ([phone]), and the rep Hassan Rowan) called me back to verify it was received.    I will call back in one week to check on status and date check will be mailed.    Will update [name] when I have more information.    [name], [place] Social Worker  [phone]

## 2021-09-23 NOTE — Telephone Encounter (Signed)
Dr. Sallyanne Kuster - pt requesting valium for MRI

## 2021-09-27 ENCOUNTER — Encounter (HOSPITAL_BASED_OUTPATIENT_CLINIC_OR_DEPARTMENT_OTHER): Payer: Self-pay | Admitting: Hospital

## 2021-09-27 ENCOUNTER — Telehealth (HOSPITAL_BASED_OUTPATIENT_CLINIC_OR_DEPARTMENT_OTHER): Payer: Self-pay

## 2021-09-27 NOTE — Telephone Encounter (Signed)
[place] / Medical Oncology Social Work Telephone note:    Interview data:  Stephen [name] called for ongoing support. He shared his many efforts for obtaining health care, dental care, and dealing with psychosocial challenges of living with his sister's family.  He may have housing options with the agency I connected him to for a housing navigator.  He will be receiving a check from [facility] in a couple weeks.    Stephen [name] asked that we meet on 11/08 after he finishes his appt with Dr. Sallyanne Kuster.  Stephen [name] may have results from tests (labs, MRI) and expects to have a treatment plan outlined that day.    Intervention:  I provided empathic listening, validation of feelings, focus on client strengths.    Encouraged Stephen [name] to f/u with housing referral and continue self advocacy efforts with is health care.    Plan:  Stephen [name] and I agreed to meet in the clinic on 11/08 around 11:30 or after appt with Sallyanne Kuster is completed.    [name], [place] Social Worker  [phone]

## 2021-09-28 MED ORDER — DIAZEPAM 5 MG OR TABS
5.0000 mg | ORAL_TABLET | Freq: Once | ORAL | 0 refills | Status: AC
Start: 2021-09-28 — End: 2021-09-28

## 2021-09-28 NOTE — Addendum Note (Signed)
Addended by: Carlota Raspberry on: 09/28/2021 09:00 AM     Modules accepted: Orders

## 2021-10-01 ENCOUNTER — Other Ambulatory Visit: Payer: Medicare Other | Attending: Urology

## 2021-10-01 DIAGNOSIS — N1330 Unspecified hydronephrosis: Secondary | ICD-10-CM

## 2021-10-01 DIAGNOSIS — C679 Malignant neoplasm of bladder, unspecified: Secondary | ICD-10-CM | POA: Insufficient documentation

## 2021-10-01 DIAGNOSIS — C662 Malignant neoplasm of left ureter: Secondary | ICD-10-CM | POA: Insufficient documentation

## 2021-10-01 LAB — COMPREHENSIVE METABOLIC PANEL, BLOOD
ALT (SGPT): 12 U/L (ref 0–41)
AST (SGOT): 20 U/L (ref 0–40)
Albumin: 5.2 g/dL (ref 3.5–5.2)
Alkaline Phos: 103 U/L (ref 40–129)
Anion Gap: 12 mmol/L (ref 7–15)
BUN: 14 mg/dL (ref 8–23)
Bicarbonate: 28 mmol/L (ref 22–29)
Bilirubin, Tot: 1.83 mg/dL — ABNORMAL HIGH (ref <1.2)
Calcium: 9.6 mg/dL (ref 8.5–10.6)
Chloride: 103 mmol/L (ref 98–107)
Creatinine: 1.32 mg/dL — ABNORMAL HIGH (ref 0.67–1.17)
GFR: 54 mL/min
Glucose: 120 mg/dL — ABNORMAL HIGH (ref 70–99)
Potassium: 4.3 mmol/L (ref 3.5–5.1)
Sodium: 143 mmol/L (ref 136–145)
Total Protein: 7.3 g/dL (ref 6.0–8.0)
eGFR Based on CKD-EPI 2021 Equation: 59 mL/min

## 2021-10-01 LAB — URIC ACID, BLOOD: Uric Acid: 5.2 mg/dL (ref 3.4–7.0)

## 2021-10-03 LAB — URINE CULTURE

## 2021-10-04 ENCOUNTER — Other Ambulatory Visit: Payer: Self-pay

## 2021-10-05 ENCOUNTER — Encounter (HOSPITAL_BASED_OUTPATIENT_CLINIC_OR_DEPARTMENT_OTHER): Payer: Self-pay | Admitting: Nurse Practitioner

## 2021-10-05 ENCOUNTER — Other Ambulatory Visit (HOSPITAL_BASED_OUTPATIENT_CLINIC_OR_DEPARTMENT_OTHER): Payer: Self-pay | Admitting: Nurse Practitioner

## 2021-10-05 DIAGNOSIS — Z981 Arthrodesis status: Secondary | ICD-10-CM

## 2021-10-05 MED ORDER — PREGABALIN 100 MG OR CAPS
ORAL_CAPSULE | ORAL | 0 refills | Status: DC
Start: 2021-10-05 — End: 2021-10-22

## 2021-10-07 NOTE — Telephone Encounter (Signed)
From: Lovena Le  To: Sheria Lang, NP  Sent: 10/05/2021 2:35 PM PDT  Subject: medication request     out of my Lyrica 100 mg it is not on my medication list MB#[id number]

## 2021-10-08 ENCOUNTER — Encounter (HOSPITAL_BASED_OUTPATIENT_CLINIC_OR_DEPARTMENT_OTHER): Payer: Self-pay | Admitting: Urology

## 2021-10-08 NOTE — Telephone Encounter (Signed)
Routing to [employer] to review patient's message and advise. Please note, pt completed 6 Jelmyto treatments a few weeks ago.

## 2021-10-08 NOTE — Telephone Encounter (Signed)
See encounter dated 10/10.

## 2021-10-11 ENCOUNTER — Ambulatory Visit: Payer: Medicare Other | Attending: Anesthesiology | Admitting: Anesthesiology

## 2021-10-11 ENCOUNTER — Encounter (HOSPITAL_BASED_OUTPATIENT_CLINIC_OR_DEPARTMENT_OTHER): Payer: Self-pay | Admitting: Hospital

## 2021-10-11 VITALS — Temp 97.4°F

## 2021-10-11 DIAGNOSIS — M47817 Spondylosis without myelopathy or radiculopathy, lumbosacral region: Secondary | ICD-10-CM | POA: Insufficient documentation

## 2021-10-11 MED ORDER — DULOXETINE HCL 20 MG OR CPEP
20.0000 mg | ORAL_CAPSULE | Freq: Every day | ORAL | 3 refills | Status: DC
Start: 2021-10-11 — End: 2022-07-28

## 2021-10-11 NOTE — Progress Notes (Signed)
PAIN CLINIC FOLLOW UP NOTE    Primary Care Physician Stephen [name]    Chief Complaint: Low Back Pain (Chronic, severe, progression), Ankle Pain (Bilateral, chronic, severe, progression), Foot Pain (Bilateral, chronic, severe, progression), and Leg Pain (Bilateral, chronic, severe, progression)      SUBJECTIVE:  This is a 70 year old male who  has a past medical history of Chronic back pain, Congenital hydronephrosis, Gout, Headache, Hematuria, Kidney disease, Kidney stones, Major depressive disorder, single episode, Polyarthropathy or polyarthritis of multiple sites, Retinal detachment, and Urethral stricture. and  has a past surgical history that includes Nephrectomy (Right, 1995); CT Insertion Of Suprapubic Cath (09/25/2015); Other surgical history; Appendectomy; Cystoscopy; Cystoscopy w/ laser lithotripsy; Colonoscopy; Transurethral resection of prostate; and Spine surgery (09/21). presenting for Low Back Pain (Chronic, severe, progression), Ankle Pain (Bilateral, chronic, severe, progression), Foot Pain (Bilateral, chronic, severe, progression), and Leg Pain (Bilateral, chronic, severe, progression)      [facility]  Main reason for today's visit: Continuous entire lower back pain radiating to other areas inner right thigh cramping as left ankle still have bilateral calf heaviness  Any other concerns: just would like to qualify for the [place] /CBD program interested if symbalta would work want to maintain ADL just with less discomfort  Have you had a pain procedure since your last visit? No  What procedure(s)?    How much relief have pain procedures provided?    Since your LAST visit to the pain clinic until NOW, how much relief have pain medications provided? Not Applicable  Over the last 12 months, how many sessions of physical therapy have you done? Zero  Of those sessions of physical therapy, how many were over the last 6 months? Zero  If you had physical therapy,  did it help? Helped partially  Do you do home exercises? Yes  What exercises do you do? walking some bicycle riding stretches  What does the pain feel like? Pressure;Tingling;Pulsing;Aching;Numbing;Radiating;Cramping;Squeezing;Itching;Pinching;Heavy  Do you have? Numbness;Increased Sweating;Weakness;Muscle Spasms;Tightness  Does the pain make it hard for you to: Walk;Sleep;Sit;Exercise  What other things are hard for you to do?    Rate your pain at its worst in the past week: 8  Rate your pain at its least in the past week: 8  Rate your pain on average in the past week: 8  What number best describes how, during the past week, pain has interfered with your enjoyment of life: 6  What number best describes how, during the past week, pain has interfered with your general activity: 6  PEG Score: 7  Have you had any new studies done to evaluate your pain since your last visit? No  Please list:      On the pain diagram today the patient shades in the areas of their low back.    The patient reports no relief from any of the injections we have done.  Pain is in the low lumbar region.  He wants to try medical cannabis but cannot afford the dosing consultation.  Also c/o pressure in his bilateral lower extremities worse at night.    The patient denies saddle anesthesia or bowel or bladder incontinence.    The patient stated their pain today is Pain Score: 6/10.       Pain Treatments  Previous pain procedures, dates, and response include:  11/24/2017 Lumbar ESI minimal relief  01/23/2018 Cervical ESI minimal relief  05/31/2019 Left L3-5 MBB >50% relief  11/26/2018 Left L3-5  MBB RFA No relief  03/24/2020 Left SI joint injection minimal relief  04/08/2020 Left L5-S1 TF ESI minimal relief  07/22/2021 TPI low back - no relief      Patient is currently using the following pain medications:  pregabalin - not helpful    Past Medical History:   Diagnosis Date    Chronic back pain     Congenital hydronephrosis     Gout     Headache      Hematuria     Kidney disease     Kidney stones     Major depressive disorder, single episode     Polyarthropathy or polyarthritis of multiple sites     Retinal detachment     Urethral stricture      Past Surgical History:   Procedure Laterality Date    CT INSERTION OF SUPRAPUBIC CATH  09/25/2015    NEPHRECTOMY Right 1995    APPENDECTOMY      COLONOSCOPY      CYSTOSCOPY      CYSTOSCOPY W/ LASER LITHOTRIPSY      OTHER SURGICAL HISTORY      Interstim 01/29/2011    SPINE SURGERY  09/21    Lumbar-sacral fusion    TRANSURETHRAL RESECTION OF PROSTATE       Current Outpatient Medications   Medication Sig Dispense Refill    acetaminophen (TYLENOL) 325 MG tablet Take 2 tablets (650 mg) by mouth every 4 hours as needed for Mild Pain (Pain Score 1-3). 30 tablet 0    ALAWAY 0.025 % ophthalmic solution INSTILL 1 DROP INTO BOTH EYES TWICE A DAY AS DIRECTED      allopurinol (ZYLOPRIM) 300 MG tablet Take 300 mg by mouth daily.      amLODIPINE (NORVASC) 5 MG tablet Take 10 mg by mouth daily.      ascorbic acid (VITAMIN C) 500 MG/5ML suspension       ciclopirox (LOPROX) 0.77 % GEL Apply topically 2 times daily.      DULoxetine (CYMBALTA) 20 MG CR capsule Take 1 capsule (20 mg) by mouth daily. 90 capsule 3    Polyethyl Glycol-Propyl Glycol (SYSTANE ULTRA OP)       pregabalin (LYRICA) 100 MG capsule TAKE 1 CAPSULE BY MOUTH EVERYDAY AT BEDTIME 30 capsule 0    senna (SENOKOT) 8.6 MG tablet Take 1 tablet (8.6 mg) by mouth daily. 30 tablet 0    triamcinolone (KENALOG) 0.1 % cream Apply 1 Application topically 2 times daily. Apply a thin layer as directed      VITAMIN D PO Take 125 mg by mouth daily.      zolpidem (AMBIEN) 5 MG tablet Take 1 tablet (5 mg) by mouth nightly as needed for Insomnia. 30 tablet 0     No current facility-administered medications for this visit.     Allergies   Allergen Reactions    Amoxicillin Rash    Sulfa Drugs Unspecified     Social History     Socioeconomic History    Marital  status: Single     Spouse name: Not on file    Number of children: Not on file    Years of education: Not on file    Highest education level: Not on file   Occupational History    Not on file   Tobacco Use    Smoking status: Never    Smokeless tobacco: Never   Substance and Sexual Activity    Alcohol use: No    Drug use: Not  Currently    Sexual activity: Not Currently     Partners: Female   Other Topics Concern    Military Service No    Blood Transfusions Yes    Caffeine Concern No    Occupational Exposure No    Hobby Hazards No    Sleep Concern Yes     Comment: due to my lower back discomfort and leg discomfort    Stress Concern Yes     Comment: Health/ housing/ financial    Weight Concern No    Special Diet No    Back Care Yes     Comment: Am very careful with any activity    Exercises Regularly Yes    Seat Belt Use Yes    Performs Self-Exams Yes    Bike Helmet Use No   Social History Narrative    Not on file     Social Determinants of Health     Financial Resource Strain: Not on file   Food Insecurity: Not on file   Transportation Needs: Not on file   Physical Activity: Not on file   Stress: Not on file   Social Connections: Not on file   Intimate Partner Violence: Not on file   Housing Stability: Not on file     Family History   Adopted: Yes   Family history unknown: Yes       Physical Exam:   Vitals: Temp 97.4 F (36.3 C) (Temporal)   Constitutional: Vital signs listed above. Well-developed, well-nourished, and no distress  Psych: alert, oriented. Speech is clear/ normal. Affect is euthymic.  Eyes: Sclera white, conjunctiva clear, lids are without lag. Pupils equal, not pinpoint.    Musculoskeletal:  L-Spine    Facet Loading:  Right positive; Left positive  Lumbar taut, tender bands consistent with active trigger points: Bilateral, lumbar paraspinals, quadratus lumborum     Sacroiliac Joint   Fortin Finger Test: Right positive; Left positive    Buttock   Buttock taut, tender bands  consistent with active trigger points: none      Neurological:  Mental Status; Awake, alert, oriented  Cranial Nerves: II-XII grossly intact  Motor: Normal bulk and tone.   Iliopsoas:  5/5 5/5  Quadriceps:  5/5 5/5  Hamstrings:    5/5 5/5  Ankle Dorsiflexion:   5/5 5/5  Ankle Plantarflexion:  5/5 5/5  Gait: Pt is able to raise from a seated position without difficulty. Gait  is not antalgic and the patient ambulates without assistance.   Normal casual, toes, heels, tandem.    Labs and Imaging:  X-Ray Lumbosacral Spine 2 Or 3 Views    Result Date: 09/07/2021  IMPRESSION: No complication in the B2-W4 posterior instrumented fusion and interbody cage placement as described above. Unchanged moderate degenerative disc disease at L2-L3 with grade 1 retrolisthesis of L2 on L3.    X-Ray Lumbosacral Spine 2 Or 3 Views    Result Date: 05/27/2021  IMPRESSION: No acute osseous abnormality. Status post revision lumbar spinal fusion without hardware complication/failure. Unchanged anterolisthesis of L5 on S1.     X-Ray Lumbosacral Spine 2 Or 3 Views    Result Date: 02/02/2021  IMPRESSION: Redemonstration of revision surgery, consisting of interbody fusion at L5-S1 and extension of posterior hardware down to S1. As compared to postoperative radiographs dating back to 08/20/2020, there is gradual anterolisthesis of L5 on S1, now measuring approximately 9 mm. This may in part be related to differences in projection. Hardware are grossly intact, including more remote  posterior instrumented fixation and interbody fusions from L3-L5. Redemonstration of grade 1 retrolisthesis of L2 on L3. 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 Bones are demineralized.  Vascular calcifications are present.    X-Ray Lumbosacral Spine 2 Or 3 Views    Result Date: 11/24/2020  IMPRESSION: No acute osseous abnormality of the lumbar spine. No complications at combined interbody and posterior instrumented fusion from L3 through S1.         Assessment and Plan:  Encounter Diagnoses   Name Primary?    Lumbosacral spondylosis without myelopathy Yes       This is a 70 year old male who  has a past medical history of Chronic back pain, Congenital hydronephrosis, Gout, Headache, Hematuria, Kidney disease, Kidney stones, Major depressive disorder, single episode, Polyarthropathy or polyarthritis of multiple sites, Retinal detachment, and Urethral stricture. and  has a past surgical history that includes Nephrectomy (Right, 1995); CT Insertion Of Suprapubic Cath (09/25/2015); Other surgical history; Appendectomy; Cystoscopy; Cystoscopy w/ laser lithotripsy; Colonoscopy; Transurethral resection of prostate; and Spine surgery (09/21). who presents for Low Back Pain (Chronic, severe, progression), Ankle Pain (Bilateral, chronic, severe, progression), Foot Pain (Bilateral, chronic, severe, progression), and Leg Pain (Bilateral, chronic, severe, progression)    Recommend cymblata 20 mg qAM.  Titrate up to 60 mg a day.  I will provide him with some cannabis dosing guidelines.    Stephen [name] was seen today for low back pain, ankle pain, foot pain and leg pain.    Diagnoses and all orders for this visit:    Lumbosacral spondylosis without myelopathy  Overview:  Added automatically from request for surgery [id number]    Orders:  -     DULoxetine (CYMBALTA) 20 MG CR capsule; Take 1 capsule (20 mg) by mouth daily.      Patient Instructions   Dr [name] has ordered cymbalta for your pain.  He will submit recommendations for titrating this medication to your primary care physician     Dosing Guidelines for Cannabis to Treat Pain    The primary active ingredient in Cannabis is delta-9THC which has long been studied as an analgesic compound.  It has been shown to be effective in pain caused by nervous system injury.    The most common model for dosing of cannabis and associated products is a self-titration model.  This is where the initial dose is very low and the dose is only  increased to tolerance to side effects and dose escalation is stopped when achieving pain relief.    Some studies have shown that increasing dose of THC too high may actually worsen, rather than improve pain. There are no human trials of CBD for treating pain, as pharmacologists have never reported CBD to be analgesic.  CBDs main action may be by providing anti-anxiety benefit, but has also been shown to decrease the side-effects of THC.    Start by using low dose administration    At low dose, there is less chance of side effects that will impair any function.   The lowest effective dose is very patient specific, and when using a low dose there is reduced chance of building tolerance.    Inhaled cannabis can provide very rapid effects (within 30 minutes) and short duration (around 2 hours).  Oral administration has a much slower and variable onset (between 2-4 hours) but will provide more prolonged relief (between 4-8 hours depending on individual metabolism).    Vaporization is the best method for inhalation which  avoids the tar and irritants when the cannabis is combusted into smoke.  It is recommended to vaporize organically grown cannabis flower and avoid inhaling cannabis oil concentrates with vape pens.  The extraction process for the oils may have unknown contaminates.  Inhalation is not recommended if you are immunocompromised as the cannabis flower may have fungus contamination.  Check with your doctor about whether it is safe for you to inhale cannabis.    Many licensed dispensaries have a variety of formulations with different ratios of THC to CBD. The dose needed will vary from person to person and the best approach is a self-titration model.  This entails starting with a low dose of THC, and then slowly increasing to your tolerance. Look for cannabis chemotypes for inhalation that are CBD dominant meaning that the [facility] content is 5% or less or start with a low oral dose of about 2 mg.      Any  liquid preparation you purchase should be clearly labeled providing a specific milligram (amount of drug) per milliliter (amount of liquid) potency (mg/ml).  In addition, ask to see the dropper in the bottle as is should have markings so you know how much of a milliliter you are taking.  This allows for accurate dosing using the marked eyedropper or you can also use a 1 milliliter syringe to draw up your dose (like an insulin syringe without a needle).  Dosing by number of drops is not accurate.  The cannabis oral form should be taken 2-3 times daily or as needed and to-tolerance. It is suggested to initially wait 6 hours between doses, until you know how long the effect lasts for you.  This is highly variable across individuals.                               The content of CBD and THC is important. For most patients, a CBD:THC ratio of approximately 1:1 is broadly effective and well tolerated.  If you find you are very sensitive to the [facility] (have dizziness, anxiety, nausea or cognitive effects), then shop for a 20:1 or 30:1 ratio of CBD to THC. This will allow for starting your THC dose at less than 1 milligram and work up slowly. If you tolerate the high CBD to THC ratios during the day, you may find using the 1:1 ratio at night will improve your sleep.    Use caution when receiving advice from the retail sales people in cannabis dispensaries. Remember that they are there for sales and have a conflict of interest when serving patients.  Be educated about what to purchase and dont allow them to sell you products that you are not seeking.        Follow-up: with primary care    Medical Decision Making  Today reviewed notes from prior visit(s) with [facility], reviewed note(s) from orthopedics and reviewed XR L-spine report    Total duration of encounter spent in pre-visit (reviewing last visit, reviewing prior Epic notes and reviewing images), intra-visit (performing physical exam and medical  discussion with patient), and post-visit (note completion and placing of orders) on the day of the encounter, excluding separately reportable services/procedures: 20 minutes.

## 2021-10-11 NOTE — Patient Instructions (Signed)
Dr [name] has ordered cymbalta for your pain.  He will submit recommendations for titrating this medication to your primary care physician     Dosing Guidelines for Cannabis to Treat Pain    The primary active ingredient in Cannabis is delta-9THC which has long been studied as an analgesic compound.  It has been shown to be effective in pain caused by nervous system injury.    The most common model for dosing of cannabis and associated products is a self-titration model.  This is where the initial dose is very low and the dose is only increased to tolerance to side effects and dose escalation is stopped when achieving pain relief.    Some studies have shown that increasing dose of THC too high may actually worsen, rather than improve pain. There are no human trials of CBD for treating pain, as pharmacologists have never reported CBD to be analgesic.  CBDs main action may be by providing anti-anxiety benefit, but has also been shown to decrease the side-effects of THC.    Start by using low dose administration   At low dose, there is less chance of side effects that will impair any function.  The lowest effective dose is very patient specific, and when using a low dose there is reduced chance of building tolerance.    Inhaled cannabis can provide very rapid effects (within 30 minutes) and short duration (around 2 hours).  Oral administration has a much slower and variable onset (between 2-4 hours) but will provide more prolonged relief (between 4-8 hours depending on individual metabolism).    Vaporization is the best method for inhalation which avoids the tar and irritants when the cannabis is combusted into smoke.  It is recommended to vaporize organically grown cannabis flower and avoid inhaling cannabis oil concentrates with vape pens.  The extraction process for the oils may have unknown contaminates.  Inhalation is not recommended if you are immunocompromised as the cannabis flower may have fungus  contamination.  Check with your doctor about whether it is safe for you to inhale cannabis.    Many licensed dispensaries have a variety of formulations with different ratios of THC to CBD. The dose needed will vary from person to person and the best approach is a self-titration model.  This entails starting with a low dose of THC, and then slowly increasing to your tolerance. Look for cannabis chemotypes for inhalation that are CBD dominant meaning that the [facility] content is 5% or less or start with a low oral dose of about 2 mg.      Any liquid preparation you purchase should be clearly labeled providing a specific milligram (amount of drug) per milliliter (amount of liquid) potency (mg/ml).  In addition, ask to see the dropper in the bottle as is should have markings so you know how much of a milliliter you are taking.  This allows for accurate dosing using the marked eyedropper or you can also use a 1 milliliter syringe to draw up your dose (like an insulin syringe without a needle).  Dosing by number of drops is not accurate.  The cannabis oral form should be taken 2-3 times daily or as needed and to-tolerance. It is suggested to initially wait 6 hours between doses, until you know how long the effect lasts for you.  This is highly variable across individuals.  The content of CBD and THC is important. For most patients, a CBD:THC ratio of approximately 1:1 is broadly effective and well tolerated.  If you find you are very sensitive to the [facility] (have dizziness, anxiety, nausea or cognitive effects), then shop for a 20:1 or 30:1 ratio of CBD to THC. This will allow for starting your THC dose at less than 1 milligram and work up slowly. If you tolerate the high CBD to THC ratios during the day, you may find using the 1:1 ratio at night will improve your sleep.    Use caution when receiving advice from the retail sales people in cannabis dispensaries. Remember that they are there for  sales and have a conflict of interest when serving patients.  Be educated about what to purchase and dont allow them to sell you products that you are not seeking.

## 2021-10-12 ENCOUNTER — Other Ambulatory Visit: Payer: Self-pay | Admitting: Cardiovascular Disease

## 2021-10-14 ENCOUNTER — Telehealth (HOSPITAL_BASED_OUTPATIENT_CLINIC_OR_DEPARTMENT_OTHER): Payer: Self-pay

## 2021-10-14 NOTE — Telephone Encounter (Signed)
[place] / Medical Oncology Social Work Telephone note:    Received an e-mail today from [place] stating he'd received the grant award from [email or web address]  He wanted their contact info, so he could call and thank them... "it has helped".    I sent him e-mail response ([email or web address] and [email or web address]) with their phone number and website.    I will follow up in person on 11/08  after his appt with Dr. Sallyanne Kuster.     Will continue to follow PRN.  [name] reaches out when needed.    [name], [place] Social Worker  [phone]

## 2021-10-15 ENCOUNTER — Encounter (HOSPITAL_BASED_OUTPATIENT_CLINIC_OR_DEPARTMENT_OTHER): Payer: Self-pay | Admitting: Nurse Practitioner

## 2021-10-18 ENCOUNTER — Telehealth (HOSPITAL_BASED_OUTPATIENT_CLINIC_OR_DEPARTMENT_OTHER): Payer: Self-pay

## 2021-10-18 ENCOUNTER — Other Ambulatory Visit: Payer: Self-pay

## 2021-10-18 NOTE — Telephone Encounter (Signed)
[place] / Medical Oncology Social Work Telephone note:    Stephen [name] left me a general voicemail to check in today.  Said he received the grant but the envelope from [employer] had "cancer" written on envelope and he does not want family to know if his diagnosis.  He understandably values his privacy.    Stephen [name] wanted to confirm that we are meeting on 11/8 after Dr. Sallyanne Kuster appt.      Called Stephen [name] back to acknowledge I receivde his voicemial and e-mail and that I still plan to meet with him on 11/08.    [name], [place] Social Worker  [phone]

## 2021-10-19 NOTE — Telephone Encounter (Signed)
From: Lovena Le  To: Sheria Lang, NP  Sent: 10/15/2021 12:26 PM PDT  Subject: Medication refill request     I need a refill on my Lyrica75 it is not on my med list thanks [facility]

## 2021-10-19 NOTE — Telephone Encounter (Signed)
Routing to RN pool.

## 2021-10-20 ENCOUNTER — Telehealth (HOSPITAL_BASED_OUTPATIENT_CLINIC_OR_DEPARTMENT_OTHER): Payer: Self-pay

## 2021-10-20 NOTE — Telephone Encounter (Signed)
Left VM for pt: Does he want 75mg  or 100mg  of Lyrica.

## 2021-10-20 NOTE — Telephone Encounter (Signed)
Called patient. No answer. Left voicemail to call RN directly at [facility]    DOS: 08/18/2020; Dr. Earnest Conroy  Stage 1: Anterior lumbar interbody fusion, bone morphogenic protein, lumbar 5- sacral 1  Stage 2: Lumbar decompression and fusions with instrumentation, allograft versus autograft, lumbar 5-sacral 1    LOV: 09/07/21        RN called patient. No answer. Per MyChart message, the patient states he is taking Lyrica 75 mg HS, but RX is for Lyrica 100 mg HS.    Need clarification prior to sending to provider.

## 2021-10-22 ENCOUNTER — Other Ambulatory Visit (HOSPITAL_BASED_OUTPATIENT_CLINIC_OR_DEPARTMENT_OTHER): Payer: Self-pay

## 2021-10-22 DIAGNOSIS — R52 Pain, unspecified: Secondary | ICD-10-CM

## 2021-10-22 DIAGNOSIS — Z981 Arthrodesis status: Secondary | ICD-10-CM

## 2021-10-22 MED ORDER — PREGABALIN 75 MG OR CAPS
75.0000 mg | ORAL_CAPSULE | Freq: Every day | ORAL | 0 refills | Status: DC
Start: 2021-10-22 — End: 2021-11-02

## 2021-10-22 MED ORDER — PREGABALIN 100 MG OR CAPS
100.0000 mg | ORAL_CAPSULE | Freq: Every evening | ORAL | 0 refills | Status: DC
Start: 2021-10-22 — End: 2021-11-02

## 2021-10-22 NOTE — Telephone Encounter (Signed)
Prescription sent to pharmacy.

## 2021-10-22 NOTE — Telephone Encounter (Signed)
Patient also discusses his pain to right leg.   "Dr [name] said there is nothing else he can do to help me. I just have this sharp pain running down my right leg and it hurts so bad. "    He does not report weakness in either leg or new onset of B/B incontinence.     I have may [place] with Dr Earnest Conroy for Monday. If he orders CT or MRI, I have asked pt to ask for this STAT. He has inp person visit on 11/08/21.

## 2021-10-22 NOTE — Telephone Encounter (Signed)
There are 2 prescriptions for lyrica.  Which one is he taking?

## 2021-10-22 NOTE — Telephone Encounter (Signed)
Per MyChart message, patient needs a refill of Lyrica.    He takes Lyrica 75 mg in AM  And  Lyrica 100 mg HS.    Will tee up for Provider Review.

## 2021-10-22 NOTE — Telephone Encounter (Signed)
He takes Lyrica 100mg  at [facility] and 75mg  in am.

## 2021-10-25 ENCOUNTER — Ambulatory Visit
Admission: RE | Admit: 2021-10-25 | Discharge: 2021-10-25 | Disposition: A | Discharge status: Home / Self Care | Payer: Medicare Other | Attending: Urology | Admitting: Urology

## 2021-10-25 ENCOUNTER — Ambulatory Visit: Payer: Medicare Other | Admitting: Orthopaedic Surgery of the Spine

## 2021-10-25 DIAGNOSIS — M5416 Radiculopathy, lumbar region: Secondary | ICD-10-CM

## 2021-10-25 DIAGNOSIS — Z981 Arthrodesis status: Secondary | ICD-10-CM | POA: Insufficient documentation

## 2021-10-25 DIAGNOSIS — Z905 Acquired absence of kidney: Secondary | ICD-10-CM

## 2021-10-25 DIAGNOSIS — N1330 Unspecified hydronephrosis: Secondary | ICD-10-CM

## 2021-10-25 DIAGNOSIS — M4310 Spondylolisthesis, site unspecified: Secondary | ICD-10-CM | POA: Insufficient documentation

## 2021-10-25 DIAGNOSIS — M48061 Spinal stenosis, lumbar region without neurogenic claudication: Secondary | ICD-10-CM | POA: Insufficient documentation

## 2021-10-25 DIAGNOSIS — Z8554 Personal history of malignant neoplasm of ureter: Secondary | ICD-10-CM

## 2021-10-25 DIAGNOSIS — Z08 Encounter for follow-up examination after completed treatment for malignant neoplasm: Secondary | ICD-10-CM

## 2021-10-25 DIAGNOSIS — C662 Malignant neoplasm of left ureter: Secondary | ICD-10-CM | POA: Insufficient documentation

## 2021-10-25 MED ORDER — LORAZEPAM 1 MG OR TABS
1.0000 mg | ORAL_TABLET | Freq: Four times a day (QID) | ORAL | 0 refills | Status: DC | PRN
Start: 2021-10-25 — End: 2021-12-01

## 2021-10-25 MED ORDER — GADOBUTROL 1 MMOL/ML IV SOLN (WRAPPED RECORD)
INTRAVENOUS | Status: AC
Start: 2021-10-25 — End: ?
  Filled 2021-10-25: qty 10

## 2021-10-25 MED ORDER — GADOBUTROL 1 MMOL/ML IV SOLN (WRAPPED RECORD)
8.5000 mL | Freq: Once | INTRAVENOUS | Status: AC
Start: 2021-10-25 — End: 2021-10-25
  Administered 2021-10-25 (×2): 8.5 mL via INTRAVENOUS

## 2021-10-25 NOTE — Telephone Encounter (Signed)
Informed patient of refill sent to his [facility]. He confirms receipt.

## 2021-10-26 ENCOUNTER — Encounter (HOSPITAL_BASED_OUTPATIENT_CLINIC_OR_DEPARTMENT_OTHER): Payer: Self-pay | Admitting: Urology

## 2021-10-26 ENCOUNTER — Ambulatory Visit (HOSPITAL_BASED_OUTPATIENT_CLINIC_OR_DEPARTMENT_OTHER): Payer: Medicare Other | Admitting: Urology

## 2021-10-26 ENCOUNTER — Encounter (HOSPITAL_BASED_OUTPATIENT_CLINIC_OR_DEPARTMENT_OTHER): Payer: Self-pay

## 2021-10-26 VITALS — BP 133/65 | HR 61 | Temp 98.0°F | Resp 16 | Ht 66.0 in | Wt 202.0 lb

## 2021-10-26 DIAGNOSIS — G8929 Other chronic pain: Secondary | ICD-10-CM | POA: Diagnosis present

## 2021-10-26 DIAGNOSIS — C662 Malignant neoplasm of left ureter: Secondary | ICD-10-CM | POA: Diagnosis present

## 2021-10-26 DIAGNOSIS — M48061 Spinal stenosis, lumbar region without neurogenic claudication: Secondary | ICD-10-CM | POA: Diagnosis present

## 2021-10-26 DIAGNOSIS — M130 Polyarthritis, unspecified: Secondary | ICD-10-CM | POA: Diagnosis present

## 2021-10-26 DIAGNOSIS — Z8551 Personal history of malignant neoplasm of bladder: Secondary | ICD-10-CM

## 2021-10-26 DIAGNOSIS — Z882 Allergy status to sulfonamides status: Secondary | ICD-10-CM

## 2021-10-26 DIAGNOSIS — R339 Retention of urine, unspecified: Secondary | ICD-10-CM | POA: Diagnosis present

## 2021-10-26 DIAGNOSIS — Z905 Acquired absence of kidney: Secondary | ICD-10-CM

## 2021-10-26 DIAGNOSIS — Z9079 Acquired absence of other genital organ(s): Secondary | ICD-10-CM

## 2021-10-26 DIAGNOSIS — N35919 Unspecified urethral stricture, male, unspecified site: Secondary | ICD-10-CM | POA: Diagnosis present

## 2021-10-26 DIAGNOSIS — M109 Gout, unspecified: Secondary | ICD-10-CM | POA: Diagnosis present

## 2021-10-26 DIAGNOSIS — Z79899 Other long term (current) drug therapy: Secondary | ICD-10-CM

## 2021-10-26 DIAGNOSIS — G834 Cauda equina syndrome: Secondary | ICD-10-CM | POA: Diagnosis present

## 2021-10-26 DIAGNOSIS — Q620 Congenital hydronephrosis: Secondary | ICD-10-CM

## 2021-10-26 DIAGNOSIS — Z20822 Contact with and (suspected) exposure to covid-19: Secondary | ICD-10-CM | POA: Diagnosis present

## 2021-10-26 DIAGNOSIS — F329 Major depressive disorder, single episode, unspecified: Secondary | ICD-10-CM | POA: Diagnosis present

## 2021-10-26 DIAGNOSIS — Z981 Arthrodesis status: Secondary | ICD-10-CM

## 2021-10-26 DIAGNOSIS — M5116 Intervertebral disc disorders with radiculopathy, lumbar region: Principal | ICD-10-CM | POA: Diagnosis present

## 2021-10-26 DIAGNOSIS — D62 Acute posthemorrhagic anemia: Secondary | ICD-10-CM | POA: Diagnosis present

## 2021-10-26 DIAGNOSIS — Z880 Allergy status to penicillin: Secondary | ICD-10-CM

## 2021-10-26 LAB — EMMI , ANESTHESIA (ADULT): EMMI Video Order Number: 13976818974

## 2021-10-26 LAB — EMMI, NOSE TO TOES PRE-OPERATIVE CLEANSING: EMMI Video Order Number: 14898831343

## 2021-10-26 MED ORDER — PREGABALIN 100 MG OR CAPS
ORAL_CAPSULE | ORAL | 0 refills | Status: DC
Start: 2021-10-26 — End: 2021-11-02

## 2021-10-26 MED ORDER — PREGABALIN 75 MG OR CAPS
75.0000 mg | ORAL_CAPSULE | Freq: Every day | ORAL | 0 refills | Status: DC
Start: 2021-10-26 — End: 2021-11-02

## 2021-10-26 NOTE — Patient Instructions (Addendum)
PLAN:    Morene Antu will contact you in 3-5 business days to schedule surgery  Please submit a urine sample 2-3 weeks before surgery  Morene Antu will let you know if COVID test is required prior to surgery   If you are taking blood thinners, please obtain clearance from the prescribing physician to hold them prior to surgery   PLEASE HOLD ALL NSAIDS (non-steroidal anti-inflammatory drugs) SUCH AS advil, aleve, motrin, ibuprofen, relafen, lodine, feldene, Diclofenac, voltaren, indomethacin, naproxen, celebrex, Mobic 7 days before surgery.   Please hold vitamins, supplements, herbs & fish oil 7 days before surgery.  See below for a more extensive list of medications that may thin your blood.      Follow up in 1 week after your scheduled ureteroscopy with Dr. Sallyanne Kuster (Cysto, stent removal)    You have been referred to the Department of Renal for a clinic consult. Please call to schedule an appointment at your earliest convenience.  Insurance varies from person to person and you may also need an insurance authorization prior to being seen. Renal Department scheduling phone number:  [phone].     For more information on Urology, please visit:   [email or web address]    [email or web address]      Carlota Raspberry MD  Department of Urology     [facility] - Urology  Monday-Friday 8:00- 5:00p.m. (Closed Holidays and Weekends)     For any question or to schedule any appointment call [phone]    AFTER HOURS EMERGENCY NUMBER [phone]  Ask for the Urologist/Oncologist Physician On-Call.        [facility] - Urology  Fax number: [phone]     Clinical Nurse  Vicente Males  Phone: [phone]  Fax: [phone]    Administrative Assistant/Surgery [place]   Phone [phone]  Fax [phone]    [place]   Phone [phone] Option 2  Chemotherapy appointments  [place] Monday - Friday 7:30AM -  9:30PM  Holidays/Weekends 8:00AM -- 6:00PM     Radiation Oncology   Phone [phone]    Radiation appointments     MRI    Phone [phone] or [phone]  MRI appointments     Radiology Scheduling   Phone [phone]   [name], CT Scans, or studies.       Social Worker  Agnes Lawrence, LCSW  Phone [phone]      Ostomy Nurse  "STAR" Carmie End, RN, BSN, [employer] [phone]  Fax [phone]  Clinic days: Tuesday/Thursday  Office days: Wednesday/Friday

## 2021-10-26 NOTE — Progress Notes (Signed)
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  CT urography on 04/22/2021 confirmed a filling defect in the anterior left renal pelvis.  Chest CT has been ordered but not performed yet.  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  He tolerated the procedure well.      He received 6 instillations of gelatinous mitomycin between August 05, 2021 and September 09, 2021.    MR urogram from October 26, 2021 with mild thickening in the left renal pelvis otherwise unremarkable.    Baseline creatinine has remained stable at 1.32    Notably, TaHG bladder cancer on 08-19-2021; repeat on 08-26-2021 no cancer.    Review of Systems: No TIA's or unusual headaches, no dysphagia.  No prolonged cough. No dyspnea or chest pain on exertion.  No abdominal pain, change in bowel habits, black or bloody stools.  No urinary tract or BPH symptoms.  No new or unusual musculoskeletal symptoms.    Physical Exam:  There were no vitals filed for this visit.   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    -I am overall quite pleased with the way that [name] has done.  At this point I would like to perform a ureteroscopy in approximately 6 weeks to investigate his upper tracts  as well as bladder    -nephrology referral

## 2021-10-26 NOTE — Telephone Encounter (Signed)
signed

## 2021-10-27 ENCOUNTER — Encounter (HOSPITAL_BASED_OUTPATIENT_CLINIC_OR_DEPARTMENT_OTHER): Payer: Self-pay | Admitting: Urology

## 2021-10-27 NOTE — Progress Notes (Signed)
[facility] / Medical Oncology Social Work Note    Current status:  Stephen [name] returned to clinic to see Dr. Sallyanne Kuster for 6 week post op visit.  Pt requested to meet with this LCSW in person for ongoing psychosocial support.  As per Artis Delay, LVN, pt is stable and doing well.  Pt also related that Dr. Sallyanne Kuster was pleased with his progress.  Stephen [name] stated he will have another surgical procedure in the next few weeks.    Stephen [name] and I reviewed his social situation.  While he's not content with the support from his sister's family, his current living situation is safe and stable.  His sister is a support to him but her husband and daughter remain a challenge.  I encouraged Stephen [name] to utilize self care measure and coping strategies to deal with the stressors at home.  He was receptive.    Stephen [name] changed topics several times, relating his other health (back pain) and social concerns.  He eventually returned to discussion of his cancer, and he's aware he's currently cancer free and very grateful for this.  He expressed appreciation for Dr. Victorino December care.    In the past, I have provided referrals for transportation [employer] transportation services and $100 [place]), housing (2-1-1 and [facility] services to complete [place]-SPDAT application for housing assessment).  Stephen [name] also has a bus pass for transportation to appts.     Plan:   Consult with Artis Delay, LVN.   Stephen [name] to contact me PRN for ongoing social support.   Will follow PRN.    [name], [place] Social Worker  [phone]

## 2021-10-28 ENCOUNTER — Encounter (HOSPITAL_BASED_OUTPATIENT_CLINIC_OR_DEPARTMENT_OTHER): Payer: Self-pay | Admitting: Orthopaedic Surgery of the Spine

## 2021-10-28 ENCOUNTER — Telehealth (INDEPENDENT_AMBULATORY_CARE_PROVIDER_SITE_OTHER): Payer: Self-pay

## 2021-10-28 ENCOUNTER — Other Ambulatory Visit: Payer: Self-pay

## 2021-10-28 LAB — URINE CULTURE

## 2021-10-28 NOTE — Telephone Encounter (Signed)
From: Lovena Le  To: [name], MD  Sent: 20/35/5974 7:59 AM PST  Subject: MRI for back    November 19 at 1 PM at [facility] for my back MRI lucky I got that hope to see you November 21 for my appointment is there anything for this nerve pain in my thigh and back for now? its really painful/ MR#[id number]/Stephen [name]/ thanks for your phone call Monday

## 2021-10-28 NOTE — Telephone Encounter (Signed)
Patient is being referred to Nephrology  for Ureter malignant neoplasm, left      Referring Provider: Carlota Raspberry, MD    Patients PCP (update PCP on chart if needed) :     Please review and advise on how to proceed with patient    Internal or External Referral internal         If external referral- were referral & notes scanned    Authorization expiration date:10/26/2022    Best Call Back Number: [phone]    Best Call Back Time: any time    Is it ok to leave a message? yes     Was pt informed of turnaround for review? yes

## 2021-10-29 ENCOUNTER — Emergency Department (HOSPITAL_BASED_OUTPATIENT_CLINIC_OR_DEPARTMENT_OTHER): Payer: Medicare Other

## 2021-10-29 ENCOUNTER — Encounter (HOSPITAL_BASED_OUTPATIENT_CLINIC_OR_DEPARTMENT_OTHER): Payer: Self-pay

## 2021-10-29 ENCOUNTER — Emergency Department (HOSPITAL_COMMUNITY): Payer: Medicare Other

## 2021-10-29 ENCOUNTER — Inpatient Hospital Stay
Admission: EM | Admit: 2021-10-29 | Discharge: 2021-11-02 | DRG: 552 | Disposition: A | Discharge status: Home / Self Care | Payer: Medicare Other | Attending: Orthopaedic Surgery of the Spine | Admitting: Orthopaedic Surgery of the Spine

## 2021-10-29 DIAGNOSIS — Z905 Acquired absence of kidney: Secondary | ICD-10-CM

## 2021-10-29 DIAGNOSIS — M2578 Osteophyte, vertebrae: Secondary | ICD-10-CM

## 2021-10-29 DIAGNOSIS — M4317 Spondylolisthesis, lumbosacral region: Secondary | ICD-10-CM

## 2021-10-29 DIAGNOSIS — M5126 Other intervertebral disc displacement, lumbar region: Secondary | ICD-10-CM

## 2021-10-29 DIAGNOSIS — Z882 Allergy status to sulfonamides status: Secondary | ICD-10-CM

## 2021-10-29 DIAGNOSIS — M5116 Intervertebral disc disorders with radiculopathy, lumbar region: Principal | ICD-10-CM | POA: Diagnosis present

## 2021-10-29 DIAGNOSIS — Z880 Allergy status to penicillin: Secondary | ICD-10-CM

## 2021-10-29 DIAGNOSIS — N35919 Unspecified urethral stricture, male, unspecified site: Secondary | ICD-10-CM | POA: Diagnosis present

## 2021-10-29 DIAGNOSIS — M47817 Spondylosis without myelopathy or radiculopathy, lumbosacral region: Secondary | ICD-10-CM

## 2021-10-29 DIAGNOSIS — Z8554 Personal history of malignant neoplasm of ureter: Secondary | ICD-10-CM

## 2021-10-29 DIAGNOSIS — G8929 Other chronic pain: Secondary | ICD-10-CM | POA: Diagnosis present

## 2021-10-29 DIAGNOSIS — D62 Acute posthemorrhagic anemia: Secondary | ICD-10-CM | POA: Diagnosis present

## 2021-10-29 DIAGNOSIS — Z20822 Contact with and (suspected) exposure to covid-19: Secondary | ICD-10-CM | POA: Diagnosis present

## 2021-10-29 DIAGNOSIS — Z9079 Acquired absence of other genital organ(s): Secondary | ICD-10-CM

## 2021-10-29 DIAGNOSIS — M109 Gout, unspecified: Secondary | ICD-10-CM | POA: Diagnosis present

## 2021-10-29 DIAGNOSIS — Z8551 Personal history of malignant neoplasm of bladder: Secondary | ICD-10-CM

## 2021-10-29 DIAGNOSIS — R2689 Other abnormalities of gait and mobility: Secondary | ICD-10-CM

## 2021-10-29 DIAGNOSIS — M130 Polyarthritis, unspecified: Secondary | ICD-10-CM | POA: Diagnosis present

## 2021-10-29 DIAGNOSIS — M5136 Other intervertebral disc degeneration, lumbar region: Secondary | ICD-10-CM

## 2021-10-29 DIAGNOSIS — F329 Major depressive disorder, single episode, unspecified: Secondary | ICD-10-CM | POA: Diagnosis present

## 2021-10-29 DIAGNOSIS — M48061 Spinal stenosis, lumbar region without neurogenic claudication: Secondary | ICD-10-CM | POA: Diagnosis present

## 2021-10-29 DIAGNOSIS — G834 Cauda equina syndrome: Secondary | ICD-10-CM | POA: Diagnosis present

## 2021-10-29 DIAGNOSIS — R339 Retention of urine, unspecified: Secondary | ICD-10-CM | POA: Diagnosis present

## 2021-10-29 DIAGNOSIS — Z79899 Other long term (current) drug therapy: Secondary | ICD-10-CM

## 2021-10-29 DIAGNOSIS — Q620 Congenital hydronephrosis: Secondary | ICD-10-CM

## 2021-10-29 DIAGNOSIS — C662 Malignant neoplasm of left ureter: Secondary | ICD-10-CM | POA: Diagnosis present

## 2021-10-29 DIAGNOSIS — M4807 Spinal stenosis, lumbosacral region: Secondary | ICD-10-CM

## 2021-10-29 DIAGNOSIS — Z981 Arthrodesis status: Secondary | ICD-10-CM

## 2021-10-29 LAB — CBC WITH DIFF, BLOOD
ANC-Automated: 8 10*3/uL — ABNORMAL HIGH (ref 1.6–7.0)
Abs Basophils: 0 10*3/uL (ref <0.2)
Abs Eosinophils: 0 10*3/uL (ref 0.0–0.5)
Abs Lymphs: 1.1 10*3/uL (ref 0.8–3.1)
Abs Monos: 0.6 10*3/uL (ref 0.2–0.8)
Basophils: 0 %
Eosinophils: 0 %
Hct: 45.2 % (ref 40.0–50.0)
Hgb: 15.9 gm/dL (ref 13.7–17.5)
Imm Gran Abs: 0.1 10*3/uL — ABNORMAL HIGH (ref <0.1)
Lymphocytes: 11 %
MCH: 31.3 pg (ref 26.0–32.0)
MCHC: 35.2 g/dL (ref 32.0–36.0)
MCV: 89 um3 (ref 79.0–95.0)
MPV: 10.8 fL (ref 9.4–12.4)
Monocytes: 6 %
Plt Count: 191 10*3/uL (ref 140–370)
RBC: 5.08 10*6/uL (ref 4.60–6.10)
RDW: 13.4 % (ref 12.0–14.0)
Segs: 82 %
WBC: 9.8 10*3/uL (ref 4.0–10.0)

## 2021-10-29 LAB — URINALYSIS WITH CULTURE REFLEX, WHEN INDICATED
Bilirubin: NEGATIVE
Blood: NEGATIVE
Glucose: NEGATIVE
Ketones: NEGATIVE
Leuk Esterase: NEGATIVE Leu/uL
Nitrite: NEGATIVE
Specific Gravity: 1.013 (ref 1.002–1.030)
Urobilinogen: NEGATIVE
pH: 7 (ref 5.0–8.0)

## 2021-10-29 LAB — BASIC METABOLIC PANEL, BLOOD
Anion Gap: 15 mmol/L (ref 7–15)
BUN: 18 mg/dL (ref 8–23)
Bicarbonate: 26 mmol/L (ref 22–29)
Calcium: 9.8 mg/dL (ref 8.5–10.6)
Chloride: 102 mmol/L (ref 98–107)
Creatinine: 1.1 mg/dL (ref 0.67–1.17)
Glucose: 126 mg/dL — ABNORMAL HIGH (ref 70–99)
Potassium: 3.5 mmol/L (ref 3.5–5.1)
Sodium: 143 mmol/L (ref 136–145)
eGFR Based on CKD-EPI 2021 Equation: >60 mL/min

## 2021-10-29 LAB — INFLUENZA A/B & SARS-COV-2 PCR COMBO FOR RAPID RESPONSE LAB
Influenza A PCR, RRL: NOT DETECTED
Influenza B PCR, RRL: NOT DETECTED
SARS-CoV-2 PCR, RRL: NOT DETECTED

## 2021-10-29 LAB — PROTHROMBIN TIME, BLOOD
INR: 1
PT,Patient: 11.2 s (ref 9.7–12.5)

## 2021-10-29 LAB — APTT, BLOOD: PTT: 30 s (ref 27–36)

## 2021-10-29 MED ORDER — MORPHINE SULFATE 4 MG/ML IJ SOLN
4.0000 mg | Freq: Once | INTRAMUSCULAR | Status: AC
Start: 2021-10-29 — End: 2021-10-29
  Administered 2021-10-29 (×2): 4 mg via INTRAVENOUS
  Filled 2021-10-29: qty 1

## 2021-10-29 MED ORDER — GADOBUTROL 1 MMOL/ML IV SOLN (WRAPPED RECORD)
9.0000 mL | Freq: Once | INTRAVENOUS | Status: AC
Start: 2021-10-29 — End: 2021-10-29
  Administered 2021-10-29 (×2): 9 mL via INTRAVENOUS
  Filled 2021-10-29: qty 9

## 2021-10-29 MED ORDER — GADOBUTROL 1 MMOL/ML IV SOLN (WRAPPED RECORD)
INTRAVENOUS | Status: AC
Start: 2021-10-29 — End: ?
  Filled 2021-10-29: qty 10

## 2021-10-29 MED ORDER — LORAZEPAM 2 MG/ML IJ SOLN
1.0000 mg | Freq: Once | INTRAMUSCULAR | Status: AC
Start: 2021-10-29 — End: 2021-10-29
  Administered 2021-10-29 (×2): 1 mg via INTRAVENOUS
  Filled 2021-10-29: qty 1

## 2021-10-29 MED ORDER — KETOROLAC TROMETHAMINE 15 MG/ML IJ SOLN
15.0000 mg | Freq: Once | INTRAMUSCULAR | Status: AC
Start: 2021-10-29 — End: 2021-10-29
  Administered 2021-10-29 (×2): 15 mg via INTRAVENOUS
  Filled 2021-10-29: qty 1

## 2021-10-29 MED ORDER — DEXAMETHASONE SOD PHOSPHATE PF 10 MG/ML IJ SOLN
6.0000 mg | Freq: Four times a day (QID) | INTRAMUSCULAR | Status: DC
Start: 2021-10-30 — End: 2021-11-02
  Administered 2021-10-30 – 2021-11-02 (×16): 6 mg via INTRAVENOUS
  Filled 2021-10-29 (×15): qty 1

## 2021-10-29 MED ORDER — ACETAMINOPHEN 325 MG PO TABS
975.0000 mg | ORAL_TABLET | Freq: Once | ORAL | Status: AC
Start: 2021-10-29 — End: 2021-10-29
  Administered 2021-10-29 (×2): 975 mg via ORAL
  Filled 2021-10-29: qty 3

## 2021-10-29 MED ORDER — LIDOCAINE 4 % EX PTCH
1.0000 | MEDICATED_PATCH | Freq: Once | CUTANEOUS | Status: AC
Start: 2021-10-29 — End: 2021-10-29
  Administered 2021-10-29 (×2): 1 via TRANSDERMAL
  Filled 2021-10-29: qty 1

## 2021-10-29 MED ORDER — SODIUM CHLORIDE 0.9 % IV SOLN
INTRAVENOUS | Status: DC
Start: 2021-10-30 — End: 2021-11-02

## 2021-10-29 MED ORDER — MORPHINE SULFATE 4 MG/ML IJ SOLN
4.0000 mg | INTRAMUSCULAR | Status: DC | PRN
Start: 2021-10-29 — End: 2021-10-31
  Administered 2021-10-29 (×4): 4 mg via INTRAVENOUS
  Filled 2021-10-29 (×4): qty 1

## 2021-10-29 NOTE — ED Notes (Signed)
Pt ambulated w/ shuffled gait unassisted to restroom. Pt states he has been having trouble controlling his bowels.

## 2021-10-29 NOTE — ED Provider Notes (Signed)
History  The date of service for the Emergency Department encounter is 10/29/2021  5:51 AM     Patient is a 70 year old male who  has a past medical history of Chronic back pain, Congenital hydronephrosis, Gout, Headache, Hematuria, Kidney disease, Kidney stones, Major depressive disorder, single episode, Polyarthropathy or polyarthritis of multiple sites, Retinal detachment, and Urethral stricture.    He has no past medical history of AF (atrial fibrillation) (CMS-HCC), Asthma, Chronic kidney disease (CKD), Chronic obstructive airway disease (CMS-HCC), Congestive heart failure (CMS-HCC), Heart disease, Hypertension, or Hypothyroidism. Patient presents to ED with acute on chronic low back pain.  Patient states he has had back surgery in the past, but generally his back pain is mild and tolerable.  Over the past few days he states his pain in the low back has increased, and is radiating to his right thigh.  Patient also endorses some difficulty urinating which she states is new.  No dysuria or hematuria.  No fever chills.  No chest pain or dyspnea, no abdominal pain no flank pain.    Of note, patient has history of malignant neoplasm of the ureter, but states he has completed his treatment and is no longer on chemotherapy.    Patient states the back pain is dull and constant.  He says is difficult to walk.    No fall or trauma.  Patient states he contacted his spine specialist who ordered an outpatient MRI, but as his pain was getting worse and he was having difficulty ambulating, he presents to ED today.    PMD: [name]    Past Medical History:   Diagnosis Date   . Chronic back pain    . Congenital hydronephrosis    . Gout    . Headache    . Hematuria    . Kidney disease    . Kidney stones    . Major depressive disorder, single episode    . Polyarthropathy or polyarthritis of multiple sites    . Retinal detachment    . Urethral stricture        Past Surgical History:   Procedure Laterality Date   . CT  INSERTION OF SUPRAPUBIC CATH  09/25/2015   . NEPHRECTOMY Right 1995   . APPENDECTOMY     . COLONOSCOPY     . CYSTOSCOPY     . CYSTOSCOPY W/ LASER LITHOTRIPSY     . OTHER SURGICAL HISTORY      Interstim 01/29/2011   . SPINE SURGERY  09/21    Lumbar-sacral fusion   . TRANSURETHRAL RESECTION OF PROSTATE         Family History   Adopted: Yes   Family history unknown: Yes       Social History     Socioeconomic History   . Marital status: Single   Tobacco Use   . Smoking status: Never   . Smokeless tobacco: Never   Substance and Sexual Activity   . Alcohol use: No   . Drug use: Not Currently   . Sexual activity: Not Currently     Partners: Female   Other Topics Concern   . Military Service No   . Blood Transfusions Yes   . Caffeine Concern No   . Occupational Exposure No   . Hobby Hazards No   . Sleep Concern Yes     Comment: due to my lower back discomfort and leg discomfort   . Stress Concern Yes     Comment: Health/  housing/ financial   . Weight Concern No   . Special Diet No   . Back Care Yes     Comment: Am very careful with any activity   . Exercises Regularly Yes   . Seat Belt Use Yes   . Performs Self-Exams Yes   . Bike Helmet Use No       Home Medication List  Prior to Admission Medications   Prescriptions Last Dose Informant Patient Reported? Taking?   ALAWAY 0.025 % ophthalmic solution   Yes No   Sig: INSTILL 1 DROP INTO BOTH EYES TWICE A DAY AS DIRECTED   DULoxetine (CYMBALTA) 20 MG CR capsule   No No   Sig: Take 1 capsule (20 mg) by mouth daily.   LORazepam (ATIVAN) 1 MG tablet   No No   Sig: Take 1 tablet (1 mg) by mouth every 6 hours as needed for Anxiety.   Polyethyl Glycol-Propyl Glycol (SYSTANE ULTRA OP)   Yes No   VITAMIN D PO   Yes No   Sig: Take 125 mg by mouth daily.   acetaminophen (TYLENOL) 325 MG tablet   No No   Sig: Take 2 tablets (650 mg) by mouth every 4 hours as needed for Mild Pain (Pain Score 1-3).   allopurinol (ZYLOPRIM) 300 MG tablet   Yes No   Sig: Take 300 mg by mouth daily.    amLODIPINE (NORVASC) 5 MG tablet   Yes No   Sig: Take 10 mg by mouth daily.   ascorbic acid (VITAMIN C) 500 MG/5ML suspension   Yes No   ciclopirox (LOPROX) 0.77 % GEL   Yes No   Sig: Apply topically 2 times daily.   pregabalin (LYRICA) 100 MG capsule   No No   Sig: TAKE 1 CAPSULE BY MOUTH EVERYDAY AT BEDTIME   pregabalin (LYRICA) 100 MG capsule   No No   Sig: Take 1 capsule (100 mg) by mouth at bedtime.   pregabalin (LYRICA) 75 MG capsule   No No   Sig: Take 1 capsule (75 mg) by mouth daily.   pregabalin (LYRICA) 75 MG capsule   No No   Sig: Take 1 capsule (75 mg) by mouth daily.   senna (SENOKOT) 8.6 MG tablet   No No   Sig: Take 1 tablet (8.6 mg) by mouth daily.   triamcinolone (KENALOG) 0.1 % cream   Yes No   Sig: Apply 1 Application topically 2 times daily. Apply a thin layer as directed   zolpidem (AMBIEN) 5 MG tablet   No No   Sig: Take 1 tablet (5 mg) by mouth nightly as needed for Insomnia.      Facility-Administered Medications: None       Review of Systems:   ROS was performed with pertinent positives and negatives noted in the HPI; all other systems are negative. This was done per my custom and practice for systems appropriate to the chief complaint in an Emergency Department setting    Physical Exam     10/29/21  0557 10/29/21  0558   BP: 142/80    Pulse: 89    Resp: 16    Temp: 97.7 F (36.5 C) 97.7 F (36.5 C)   SpO2: 97%        VS noted and nl, O2 sat wnl    Pt appears uncomfortable  Speaks in full sentences with fluid speech   Face and scalp atraumatic  OP dry  Neck supple, no cervical spine tenderness  RRR, no murms  Chest with nl RR, CTAB  abd soft NTND, no r/g  Back no CVAT  No thoracic spine tenderness to palpation  Lumbar spine with healed surgical scar.  There is no midline tenderness to palpation, but there is right paraspinous tenderness in the lumbar region.  No swelling erythema or warmth noted  extrs wwp no edema  Awake alert, MAE and follows commands.  Lower extremity strength 5-/5  bilaterally, limited due to pain.  Sensation to light touch is intact throughout.  Patient is hyperreflexic to the left lower extremity with 3-4 beats of ankle clonus on the left.  Patellar reflexes are normal on the right, no ankle clonus on the right.  Skin warm, dry, no rashes or lesions    Impression/MDM:   Older gentleman with acute on chronic back pain, with concern for urinary retention, as well as hyperreflexia on the left lower extremity.  Patient has history of neoplasm of the ureter, with worsening back pain, and urinary retention.  Concern for mass effect, cauda equina.    Initial ED Plan:   Pain control, preop labs, and MRI of the lumbar spine.  We will measure postvoid residual, and place Foley catheter if warranted.    ED Course: The subsequent ED course, lab/imaging results, and final disposition of the patient are documented in a separate continuation/progress note. Please see that note for details.            Fernande Bras, MD  10/29/21 [phone]

## 2021-10-29 NOTE — ED Floor Report (Signed)
ED to IP Handoff    Report created by Donneta Romberg, RN at 11:10 PM 10/29/2021.     HANDOFF REPORT UPDATE/CHANGES (changes in patient status/care/events prior to transfer)  By who:  Time:   Additional information:                                                                                                                                                     Kion Huntsberry is a 70 year old male.    Brief Summary of ED Visit (to include focused assessment and neuro status):  Patient is a 70 year old male who  has a past medical history of Chronic back pain, Congenital hydronephrosis, Gout, Headache, Hematuria, Kidney disease, Kidney stones, Major depressive disorder, single episode, Polyarthropathy or polyarthritis of multiple sites, Retinal detachment, and Urethral stricture.     He has no past medical history of AF (atrial fibrillation) (CMS-HCC), Asthma, Chronic kidney disease (CKD), Chronic obstructive airway disease (CMS-HCC), Congestive heart failure (CMS-HCC), Heart disease, Hypertension, or Hypothyroidism. Patient presents to ED with acute on chronic low back pain.  Patient states he has had back surgery in the past, but generally his back pain is mild and tolerable.  Over the past few days he states his pain in the low back has increased, and is radiating to his right thigh.  Patient also endorses some difficulty urinating which she states is new.  No dysuria or hematuria.  No fever chills.  No chest pain or dyspnea, no abdominal pain no flank pain.     Of note, patient has history of malignant neoplasm of the ureter, but states he has completed his treatment and is no longer on chemotherapy.     Patient states the back pain is dull and constant.  He says is difficult to walk.     No fall or trauma.  Patient states he contacted his spine specialist who ordered an outpatient MRI, but as his pain was getting worse and he was having difficulty ambulating, he presents to ED today.    RN shift assessment  exceptions to WDL: pain to right leg     Any significant events and interventions with responses:  urinary retention pt was voiding but still retaining urine    Radiologic studies not completed:   (None unless otherwise noted)    Chief Complaint   Patient presents with    Low Back Pain     Pt BIBA c/o Right leg to lower back pain. Outpatient MRI scheduled for this Saturday but couldn't wait.        Admitted for: Lumbar Adjacent Segment Disease with Spondylolisthesis    Code Status:  Please refer to In-pt admitting doctors orders     Level of Care: MS     Sepsis Section:    Is patient septic? no If  yes, complete below:    BC x 2 drawn? no  If No explain:      Repeat lactate needed? no  If Yes, when is it due?      Have TWO Blood Pressures (with MAPs) been documented AFTER crystalloids completed?  yes     All initial antibiotics given?  no  If No, explain:      Amount of IV fluids received 0 mL  _________________________________________________________________    Is patient on Heparin? no If yes, complete below:     Time Heparin bolus was given:     Additional drips patient is on:     Cardiac rhythm:    Oxygen Delivery: None    Past Medical History:   Diagnosis Date    Chronic back pain     Congenital hydronephrosis     Gout     Headache     Hematuria     Kidney disease     Kidney stones     Major depressive disorder, single episode     Polyarthropathy or polyarthritis of multiple sites     Retinal detachment     Urethral stricture        Past Surgical History:   Procedure Laterality Date    CT INSERTION OF SUPRAPUBIC CATH  09/25/2015    NEPHRECTOMY Right 1995    APPENDECTOMY      COLONOSCOPY      CYSTOSCOPY      CYSTOSCOPY W/ LASER LITHOTRIPSY      OTHER SURGICAL HISTORY      Interstim 01/29/2011    SPINE SURGERY  09/21    Lumbar-sacral fusion    TRANSURETHRAL RESECTION OF PROSTATE         Allergies: Amoxicillin and Sulfa drugs    ED Fall Risk: (!) Yes    Skin issues:  no    >> If yes, note areas of skin breakdown.  See appropriate photos.      Ambulatory:  yes    Sitter needed: no    CSSRS Suicide Risk Level Screening in Triage: Minimal Risk    Does this patient have a history of aggressive behavior and/or is this patient a green banner patient? no    Isolation Required: no     >> If yes , what type of isolation:     Is patient in custody?  no    Is patient in restraints? no    Swallow Screen:      Swallow Screen Result:      Was the patient made NPO & a Speech Language Pathology Consult Ordered? no    Vitals:    10/29/21 1132 10/29/21 1545 10/29/21 1957 10/29/21 2259   BP: 143/76 144/83 153/82 121/57   BP Location: Right arm  Right arm    BP Patient Position: Lying right side  Lying right side Sitting   Pulse: 66 66 66 69   Resp: 16 18 18 18    Temp:   98 F (36.7 C) 97.8 F (36.6 C)   SpO2: 96% 98% 96% 96%   Weight:           Tubes Collected: (S) Blue, Yellow SST, Green PST, Lavender (10/29/21 0817 : Alvera Novel, RN)    Lab Results   Component Value Date    WBC 9.8 10/29/2021    RBC 5.08 10/29/2021    HGB 15.9 10/29/2021    HCT 45.2 10/29/2021    MCV 89.0 10/29/2021    MCHC 35.2 10/29/2021  RDW 13.4 10/29/2021    PLT 191 10/29/2021    MPV 10.8 10/29/2021       Lab Results   Component Value Date    NA 143 10/29/2021    K 3.5 10/29/2021    CL 102 10/29/2021    BICARB 26 10/29/2021    BUN 18 10/29/2021    CREAT 1.10 10/29/2021    GLU 126 (H) 10/29/2021    Frederic 9.8 10/29/2021       No results found for: BNP, PHOS, MG, LACTATE, AMMONIA, IONCA, ARTIONCA    No results found for: CPK, CKMBH, TROPONIN    No results found for: PH, PCO2, O2CONTENT, IVHC3, IVBE, O2SAT, UNPH, UNPCO2, ARTPH, ARTPCO2, ARTO2CNT, IAHC3, IABE, ARTO2SAT, UNAPH, UNAPCO2    No results found for this visit on 10/29/21.      Patient Lines/Drains/Airways Status       Active PICC Line / CVC Line / PIV Line / Drain / Airway / Intraosseous Line / Epidural Line / ART Line / Line Type / Wound / Pressure Ulcer Injury       Name Placement date Placement time Site Days     Peripheral IV - 20 G Left Antecubital 10/29/21  0817  Antecubital  less than 1    Indwelling Urinary Catheter -  10/29/21 RN 16 fr 10 ml Yes 10/29/21  2300  --  less than 1                        ED Handoff Report is ready for review.  Admitting RN may reach Emergency Department RN, Donneta Romberg, RN, at [phone] with any questions.

## 2021-10-29 NOTE — ED Notes (Signed)
Notified mri staff meds are ordered, awaiting eta, plan to give 20 min prior to mri scan.

## 2021-10-29 NOTE — ED Notes (Signed)
Pt urgently ambulated to restroom by himself. Pt educated on use of call bell and need to let staff know due to his fall risk status. Pt states "he can walk by himself" and prefers the restroom to the commode. Pt provided with walker and placed near restroom for easy access. Call bell placed within reach. This Office manager aware of need for frequent rounds.

## 2021-10-29 NOTE — ED Notes (Signed)
Pt requested more pain medicine. Pt will not give a pain number on the pain scale.

## 2021-10-29 NOTE — EMS Narrative (Addendum)
Alert:  [id number]  [id number]  [id number]  BLS07  BLS-Basic /EMT  Incident Location Type: Y92.00  [address]  [id number]  [id number]    CC:    Impression:  Primary Symptom: R10.84 - Generalized abdominal pain  Provider's Primary Impression: R10.84 - Generalized abdominal pain  Lower Acuity Nyoka Cowden)    HPI:Pt Age: 70 Years; Gender: Male; Is there any reason to suspect patient   may HAVE or may have been EXPOSED to COVID-19: No; Provide specific details   for suspecting patient may HAVE or have been EXPOSED to COVID-19: ;Primary   Impression: Abdominal Pain/Problems;BLS07 arrived on scene to find a 70   year old male with a chief complaint of nerve pain to his right upper leg   radiating to his back. Patient states his pain to his right leg started   about 1 week ago, patient states his pain started to become unbearable   yesterday morning and activated 911 due to increased pain and having no   transportation to the hospital. Patient is alert and oriented X3, patient   has a patent airway, patient is breathing full and effective, patient has   no signs of major bleeding or trauma. Patient states he has no head or neck   pain, patient states he has pain radiating from his Right thigh, all the   way to his lower back due to nerve damage. Patients vitals are all within   normal limits. Secondary unremarkable.Patient was assisted onto gurney by   crew of BLS07, patient was further assessed and vitals were taken in back   of ambulance.Base Called: [id number] en route to [place] transporting   code 50, full report given to RN upon arrival.    Medical History: not recorded    Allergies to Medications: not recorded    Environmental and Food Allergies:    Current Medications:    Assessment:    Cardiac Arrest:  No    EMS Airway:    EMS Medications:    EMS Procedures:  Date/Time Procedure Performed: 2022-11-11T04:57:16.000-08:00  Procedure Performed Prior to this Unit's EMS Care: No  Procedure: Pulse Oximetry Monitored  Procedure  Complication: None  Response to Procedure: Unchanged    EMS Exam:    Patient Demographics:  Last Name: [name]  First Name: Stephen: [id number]  Patient's Home State: [place] of Residence: [name]  Gender: Male  Race: White  Age: 69  Age Units: Years    Demographics Practitioner:    Advanced Directives:    Demographics Payment:  Medi-Cal  Stephen [name]    Incident Times:  Unit Notified by Dispatch Date/Time: 2022-11-11T04:40:59.000-08:00  Unit En Route Date/Time: 2022-11-11T04:42:06.000-08:00  Unit Arrived on Scene Date/Time: 2022-11-11T04:53:08.000-08:00  Arrived at Patient Date/Time: 2022-11-11T05:01:08.000-08:00  Unit Left Scene Date/Time: 2022-11-11T05:01:05.000-08:00  Patient Arrived at Destination Date/Time: 2022-11-11T05:40:29.000-08:00

## 2021-10-29 NOTE — ED MD Progress Note (Signed)
Results for orders placed or performed during the hospital encounter of 85/27/78   Basic Metabolic Panel, Blood Green Plasma Separator Tube   Result Value Ref Range    Glucose 126 (H) 70 - 99 mg/dL    BUN 18 8 - 23 mg/dL    Creatinine 1.10 0.67 - 1.17 mg/dL    eGFR Based on CKD-EPI 2021 Equation >60 mL/min    Sodium 143 136 - 145 mmol/L    Potassium 3.5 3.5 - 5.1 mmol/L    Chloride 102 98 - 107 mmol/L    Bicarbonate 26 22 - 29 mmol/L    Anion Gap 15 7 - 15 mmol/L    Calcium 9.8 8.5 - 10.6 mg/dL   CBC w/ Diff Lavender   Result Value Ref Range    WBC 9.8 4.0 - 10.0 1000/mm3    RBC 5.08 4.60 - 6.10 mill/mm3    Hgb 15.9 13.7 - 17.5 gm/dL    Hct 45.2 40.0 - 50.0 %    MCV 89.0 79.0 - 95.0 um3    MCH 31.3 26.0 - 32.0 pgm    MCHC 35.2 32.0 - 36.0 g/dL    RDW 13.4 12.0 - 14.0 %    MPV 10.8 9.4 - 12.4 fL    Plt Count 191 140 - 370 1000/mm3    Segs 82 %    Lymphocytes 11 %    Monocytes 6 %    Eosinophils 0 %    Basophils 0 %    ANC-Automated 8.0 (H) 1.6 - 7.0 1000/mm3    Imm Gran Abs 0.1 (H) <0.1 1000/mm3    Abs Lymphs 1.1 0.8 - 3.1 1000/mm3    Abs Monos 0.6 0.2 - 0.8 1000/mm3    Abs Eosinophils 0.0 0.0 - 0.5 1000/mm3    Abs Basophils 0.0 <0.2 1000/mm3    Diff Type Automated    aPTT, Blood Blue   Result Value Ref Range    PTT 30 27 - 36 sec   Prothrombin Time, Blood Blue   Result Value Ref Range    PT,Patient 11.2 9.7 - 12.5 sec    INR 1.0      Chemistry normal  CBC normal  INR normal    PVR: > 999 mL (repeated and verified)     MRI L Spine:  Pending at time of dictation.    I have signed out patient care to the oncoming physician who will follow up on MRI results and make final disposition.

## 2021-10-29 NOTE — ED Notes (Signed)
Rounding on pt- pt denies of new or worsening symptoms. Repositioned self, vitals rechecked, pt has call light in reach. Need PVR, MRI. Pt verbalized understanding.

## 2021-10-29 NOTE — ED Notes (Addendum)
Past Medical History:   Diagnosis Date    Chronic back pain     Congenital hydronephrosis     Gout     Headache     Hematuria     Kidney disease     Kidney stones     Major depressive disorder, single episode     Polyarthropathy or polyarthritis of multiple sites     Retinal detachment     Urethral stricture

## 2021-10-29 NOTE — ED Notes (Addendum)
Spent another 30 minutes with pt. Pt agreeable to change into patient gown, nonslip fallrisk socks on, placed urinal in front of pt and call light in reach. Awaiting bloodtests, xr results, and need mri scan. Again instructed pt to call for help if he needs to urinate as pt is high fall risk. Pvr was also again discussed. Pt verbalized understanding.

## 2021-10-29 NOTE — ED Notes (Signed)
Pt up to restroom to void- pt using walker

## 2021-10-29 NOTE — ED MD Progress Note (Signed)
ED Course as of 10/29/21 1722   [name]'s Documentation   Fri Oct 29, 2021   1703 Received a message from Radiology.  Concern for worsening cauda equina.  Will consult spine surgery.   1625 S/o AM. Dispo per MRI. Hx of GU Stephen [name]. Also hx of acute on chronic back pain. S/p surgery to L spine. Pain radiating to RLE. Urine retention.  - MRI

## 2021-10-29 NOTE — ED Notes (Signed)
Pt does not want catheter at this time. Pt states he will try to urinate more while he is waiting for results to come in.

## 2021-10-29 NOTE — ED Notes (Signed)
Pt reported claustrophobia associated with mri, notified Dr. Ferdinand Lango.

## 2021-10-29 NOTE — ED Notes (Signed)
Mri staff [name] at bedside for mri screening.

## 2021-10-29 NOTE — ED Notes (Addendum)
Pt is at xr at this time. Nonslip fallrisk socks placed on pt. Spent about 20 minutes with this pt, discussing plan of care, plan for bloodwork, pain medicine, xr, mri, pt talking in full clear sentences, denies recent fall/injury, denies previous blood clot, denies cp/sob, reports new right thigh pain, ongoing low back pain, pmh spinal surgeries, has left kidney, received chemo. Repeatedly asked patient to pls ask for help if needed, educated multiple times on call light use, not get out of bed, use urinal, pt agreeable at this time, states "I use the urinal at home." Will round on pt frequently. Pvr discussed, pt stated he urinated "over an hour ago." Will plan for pvr next urination.

## 2021-10-29 NOTE — ED Notes (Signed)
PVR=>999 mL. Notified pt, pt in disbelief, stating he fully emptied his bladder and does not need catheter. Pt again tried to empty his bladder, able to urinate 400 additional mL into urinal, again repeated the PVR and bladder scan shows >942ml again. Notified Dr. Ferdinand Lango. Pt does not believe the bladder scan is accurate. Notified MD. Durene Cal pt to mri at this time with mri staff.

## 2021-10-29 NOTE — H&P (Signed)
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 Does not use cane or walker at baseline (before a couple days ago). Pain started RIGHT medial knee on Tues/Wed this week and now involves the whole right anterior/medial thigh. Severe constant throbbing/burning pain.     I asked the patient to be VERY specific about his urinary symptoms. He says he is urinating the exact same amount at the exact same times of the day. Not straining to pee. No hesitancy. He thinks he is peeing exactly the same today as he was a week ago. He denies numbness around the penis, testes, anus; no saddle anesthesia. Sometimes he says it feels 'weird' to have a bowel movement, maybe 'loose' but he denies frank incontinence or is unwilling to endorse.    Cramping in LEFT calf and ankle is chronic. Hx of BLE edema, unsure if venous stasis or lymphedema.    PVR in ED with >923ml, patient voided 410ml, bladder scan again >922ml  Then straight cath performed with 1519ml output    Past Medical History:  Past Medical History:   Diagnosis Date   . Chronic back pain    . Congenital hydronephrosis    . Gout    . Headache    . Hematuria    . Kidney disease    . Kidney stones    . Major depressive disorder, single episode    . Polyarthropathy or polyarthritis of multiple sites    . Retinal  detachment    . Urethral stricture        Past Surgical History:  Past Surgical History:   Procedure Laterality Date   . CT INSERTION OF SUPRAPUBIC CATH  09/25/2015   . NEPHRECTOMY Right 1995   . APPENDECTOMY     . COLONOSCOPY     . CYSTOSCOPY     . CYSTOSCOPY W/ LASER LITHOTRIPSY     . OTHER SURGICAL HISTORY      Interstim 01/29/2011   . SPINE SURGERY  09/21    Lumbar-sacral fusion   . TRANSURETHRAL RESECTION OF PROSTATE         Medications:  Prior to Admission Medications   Prescriptions Last Dose Informant Patient Reported? Taking?   ALAWAY 0.025 % ophthalmic solution   Yes No   Sig: INSTILL 1 DROP INTO BOTH EYES TWICE A DAY AS DIRECTED   DULoxetine (CYMBALTA) 20 MG CR capsule   No No   Sig: Take 1 capsule (20 mg) by mouth daily.   LORazepam (ATIVAN) 1 MG tablet   No No   Sig: Take 1 tablet (1 mg) by mouth every 6 hours as needed for Anxiety.   Polyethyl Glycol-Propyl Glycol (SYSTANE ULTRA OP)   Yes No   VITAMIN D PO   Yes No   Sig: Take 125  mg by mouth daily.   acetaminophen (TYLENOL) 325 MG tablet   No No   Sig: Take 2 tablets (650 mg) by mouth every 4 hours as needed for Mild Pain (Pain Score 1-3).   allopurinol (ZYLOPRIM) 300 MG tablet   Yes No   Sig: Take 300 mg by mouth daily.   amLODIPINE (NORVASC) 5 MG tablet   Yes No   Sig: Take 10 mg by mouth daily.   ascorbic acid (VITAMIN C) 500 MG/5ML suspension   Yes No   ciclopirox (LOPROX) 0.77 % GEL   Yes No   Sig: Apply topically 2 times daily.   pregabalin (LYRICA) 100 MG capsule   No No   Sig: TAKE 1 CAPSULE BY MOUTH EVERYDAY AT BEDTIME   pregabalin (LYRICA) 100 MG capsule   No No   Sig: Take 1 capsule (100 mg) by mouth at bedtime.   pregabalin (LYRICA) 75 MG capsule   No No   Sig: Take 1 capsule (75 mg) by mouth daily.   pregabalin (LYRICA) 75 MG capsule   No No   Sig: Take 1 capsule (75 mg) by mouth daily.   senna (SENOKOT) 8.6 MG tablet   No No   Sig: Take 1 tablet (8.6 mg) by mouth daily.   triamcinolone (KENALOG) 0.1 % cream   Yes No   Sig: Apply 1  Application topically 2 times daily. Apply a thin layer as directed   zolpidem (AMBIEN) 5 MG tablet   No No   Sig: Take 1 tablet (5 mg) by mouth nightly as needed for Insomnia.      Facility-Administered Medications: None       Allergies:  Amoxicillin and Sulfa drugs    Family History:  Non-contributory    Social History:  Social History     Socioeconomic History   . Marital status: Single   Tobacco Use   . Smoking status: Never   . Smokeless tobacco: Never   Substance and Sexual Activity   . Alcohol use: No   . Drug use: Not Currently   . Sexual activity: Not Currently     Partners: Female   Other Topics Concern   . Military Service No   . Blood Transfusions Yes   . Caffeine Concern No   . Occupational Exposure No   . Hobby Hazards No   . Sleep Concern Yes     Comment: due to my lower back discomfort and leg discomfort   . Stress Concern Yes     Comment: Health/ housing/ financial   . Weight Concern No   . Special Diet No   . Back Care Yes     Comment: Am very careful with any activity   . Exercises Regularly Yes   . Seat Belt Use Yes   . Performs Self-Exams Yes   . Bike Helmet Use No        Review of Systems:  - constitutional (f/c/wt loss)  - eyes (blurry vision)  - ears, nose, mouth, throat (pain, congestion, dysphagia)  - cardiovascular (chest pain, palpitations)  - respiratory (sob, cough)  - gastrointestinal (n/v/diarrhea)  - genitourinary (blood, pain)  + musculoskeletal: back pain, leg pain  - integumentary (rash)  - neurological (numbness, weakness)  - psychiatric (depression)  - endocrine (wt loss or gain, excess hunger or thirst)  - hematologic/ lymphatic (easy bleeding or bruising)  - allergic/ immunologic (HIV, AIDS, allergies)     Physical Exam:  Vital Signs:   10/29/21  1029 10/29/21  1132 10/29/21  1545 10/29/21  1957   BP: 135/82 143/76 144/83 153/82   Pulse: 82 66 66 66   Resp: 17 16 18 18    Temp: 98.1 F (36.7 C)   98 F (36.7 C)   SpO2: 98% 96% 98% 96%      Body mass index is 32.45  kg/m.    General: no acute distress  HEENT: hearing & vision grossly intact  CV: regular rate per peripheral pulses  Pulm: non-labored breathing  Abd: soft, non-distended  Neuro: alert, awake, and oriented x 4    Spine Exam  Skin: atraumatic, incision healed  No cervical, no thoracic, no lumbar, no sacral tenderness  No palpable stepoffs  Endorses paraspinal muscle soreness    Bilateral Lower Extremity   General:        Atraumatic, normal muscle tone, no atrophy, appropriate active range of motion   Motor:       Right           Left        Iliopsoas (L2/3)                     5-/5           5/5         Quadriceps (L4)                    5/5           5/5        Tibialis Anterior (L4/5)           5/5           5/5        EHL (L5)                               5/5           5/5        GSC (S1)                              5/5           5/5        FHL (S2)                               5/5           5/5   Reflexes:        Patellar (L3/4)                        3+               3+        Achilles (S1)                           2+              2+   Sensation:        SILT in L2-S1 distributions. SILT to the entire groin, genitals, perineal and perianal regions.   Special Tests:        1 beat clonus RLE. 3 beats clonus LLE. Rectal exam by ED reportedly with normal tone.  Labs:  Lab Results   Component Value Date    NA 143 10/29/2021    K 3.5 10/29/2021    CL 102 10/29/2021    BICARB 26 10/29/2021    BUN 18 10/29/2021    CREAT 1.10 10/29/2021    GLU 126 (H) 10/29/2021    [place] 9.8 10/29/2021     Lab Results   Component Value Date    WBC 9.8 10/29/2021    HGB 15.9 10/29/2021    HCT 45.2 10/29/2021    PLT 191 10/29/2021     Lab Results   Component Value Date    INR 1.0 10/29/2021    PTT 30 10/29/2021       Imaging:  New MRI L spine with progression of L2-L3 ASD and DDD, causing severe canal stenosis. Retrolisthesis at this level has progressed. Severe right NFS at this level too, moderate on the left.    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 No further inpatient testing.  - decadron IV  - multimodal pain regimen    Please page the Orthopaedic Spine Surgery Team with questions or concerns based on patient location:     Established spine floor patients at [place]: Ortho Spine 1 - [phone]      Established spine floor patients at [facility]: Ortho Spine 2 - [phone]      New spine consultations not yet followed by spine team:    View [facility] on call for "[place]" call schedule (Orthopaedic Spine versus Neurosurgery)   If Orthopaedic Spine is on call please page the [place] on call:   [place]: ORTHOPEDICS/TH   [place]: ORTHOPEDICS/HC    Patient discussed with fellow on call, Dr. Mamie Nick.  The attending of record for this encounter is Dr. Lelon Huh.    Darlys Gales, [place] [facility]

## 2021-10-30 MED ORDER — AMLODIPINE 10 MG OR TABS
10.0000 mg | ORAL_TABLET | Freq: Every day | ORAL | Status: DC
Start: 2021-10-30 — End: 2021-11-02
  Administered 2021-10-30 – 2021-11-02 (×5): 10 mg via ORAL
  Filled 2021-10-30 (×2): qty 1
  Filled 2021-10-30: qty 2
  Filled 2021-10-30: qty 1

## 2021-10-30 MED ORDER — MORPHINE SULFATE 2 MG/ML IJ SOLN
2.0000 mg | INTRAMUSCULAR | Status: DC | PRN
Start: 2021-10-30 — End: 2021-11-02
  Administered 2021-10-31 (×2): 2 mg via INTRAVENOUS
  Filled 2021-10-30: qty 1

## 2021-10-30 MED ORDER — OXYCODONE HCL 10 MG OR TABS
10.0000 mg | ORAL_TABLET | ORAL | Status: DC | PRN
Start: 2021-10-30 — End: 2021-10-30
  Administered 2021-10-30 (×2): 10 mg via ORAL
  Filled 2021-10-30: qty 1

## 2021-10-30 MED ORDER — KETOROLAC TROMETHAMINE 15 MG/ML IJ SOLN
15.0000 mg | Freq: Four times a day (QID) | INTRAMUSCULAR | Status: DC
Start: 2021-10-30 — End: 2021-11-02
  Administered 2021-10-30 – 2021-11-02 (×15): 15 mg via INTRAVENOUS
  Filled 2021-10-30 (×14): qty 1

## 2021-10-30 MED ORDER — OXYCODONE HCL 5 MG OR TABS
5.0000 mg | ORAL_TABLET | ORAL | Status: DC | PRN
Start: 2021-10-30 — End: 2021-10-30

## 2021-10-30 MED ORDER — NALOXONE HCL 0.4 MG/ML IJ SOLN
0.1000 mg | INTRAMUSCULAR | Status: DC | PRN
Start: 2021-10-30 — End: 2021-11-02

## 2021-10-30 MED ORDER — OXYCODONE HCL 10 MG OR TABS
10.0000 mg | ORAL_TABLET | ORAL | Status: DC | PRN
Start: 2021-10-30 — End: 2021-11-02
  Administered 2021-10-30 (×2): 10 mg via ORAL
  Filled 2021-10-30: qty 1

## 2021-10-30 MED ORDER — ZOLPIDEM TARTRATE 5 MG OR TABS
5.0000 mg | ORAL_TABLET | Freq: Every evening | ORAL | Status: DC | PRN
Start: 2021-10-30 — End: 2021-11-02

## 2021-10-30 MED ORDER — OXYCODONE HCL 5 MG OR TABS
15.0000 mg | ORAL_TABLET | ORAL | Status: DC | PRN
Start: 2021-10-30 — End: 2021-11-02
  Administered 2021-10-30 – 2021-11-02 (×11): 15 mg via ORAL
  Filled 2021-10-30 (×11): qty 1

## 2021-10-30 MED ORDER — VITAMIN C 500 MG/5ML OR LIQD
500.0000 mg | Freq: Every day | ORAL | Status: DC
Start: 2021-10-30 — End: 2021-11-02
  Administered 2021-10-30 – 2021-11-02 (×5): 500 mg via ORAL
  Filled 2021-10-30 (×7): qty 5

## 2021-10-30 MED ORDER — DULOXETINE HCL 20 MG OR CPEP
20.0000 mg | ORAL_CAPSULE | Freq: Every day | ORAL | Status: DC
Start: 2021-10-30 — End: 2021-11-02
  Administered 2021-10-30 – 2021-11-02 (×5): 20 mg via ORAL
  Filled 2021-10-30 (×4): qty 1

## 2021-10-30 MED ORDER — OXYCODONE HCL 5 MG OR TABS
5.0000 mg | ORAL_TABLET | ORAL | Status: DC | PRN
Start: 2021-10-30 — End: 2021-11-02

## 2021-10-30 MED ORDER — ACETAMINOPHEN 325 MG PO TABS
975.0000 mg | ORAL_TABLET | Freq: Three times a day (TID) | ORAL | Status: DC
Start: 2021-10-30 — End: 2021-11-02
  Administered 2021-10-30 – 2021-11-02 (×12): 975 mg via ORAL
  Filled 2021-10-30 (×11): qty 3

## 2021-10-30 MED ORDER — ALLOPURINOL 300 MG OR TABS
300.0000 mg | ORAL_TABLET | Freq: Every day | ORAL | Status: DC
Start: 2021-10-30 — End: 2021-11-02
  Administered 2021-10-30 – 2021-11-02 (×5): 300 mg via ORAL
  Filled 2021-10-30: qty 3
  Filled 2021-10-30 (×3): qty 1

## 2021-10-30 MED ORDER — DEXAMETHASONE SOD PHOSPHATE PF 10 MG/ML IJ SOLN
6.0000 mg | Freq: Four times a day (QID) | INTRAMUSCULAR | Status: DC
Start: 2021-10-30 — End: 2021-10-30

## 2021-10-30 MED ORDER — PREGABALIN 100 MG OR CAPS
100.0000 mg | ORAL_CAPSULE | Freq: Every evening | ORAL | Status: DC
Start: 2021-10-30 — End: 2021-11-01
  Administered 2021-10-30 – 2021-10-31 (×4): 100 mg via ORAL
  Filled 2021-10-30 (×3): qty 1

## 2021-10-30 MED ORDER — LORAZEPAM 1 MG OR TABS
1.0000 mg | ORAL_TABLET | Freq: Four times a day (QID) | ORAL | Status: DC | PRN
Start: 2021-10-30 — End: 2021-11-02
  Administered 2021-10-31 – 2021-11-01 (×3): 1 mg via ORAL
  Filled 2021-10-30 (×2): qty 1

## 2021-10-30 NOTE — Interdisciplinary (Signed)
10/30/21 1637   Vital Signs   Observations Admit   Temperature 97.8 F (36.6 C)   Temp source Oral   Heart Rate 76   Source Monitor   Respirations 20   Blood pressure (BP) 147/77   MAP (mmHg) 100   BP Source Monitor   BP Location Right arm   BP Patient Position Semi-Fowlers   Oxygen Therapy   SpO2 97 %   O2 Device None (Room air)   Sedation & Agitation   RASS Score 0   POSS 1   Self Report   Pain Score 6   Patient's Stated Pain Goal 5   Pain Site Assessment   Pain Orientation (primary site) Right   Pain Location Back;Leg   Pain Intervention(s) Rest   Patient admitted from [place]. A&Ox4. In NAD. MAE x4 strong/equal. Denies numbness/tingling. Reports back and right leg pain. Was medicated w/ Roxicodone prior to transfer. Oriented patient to room, call light and continuation of MD orders.

## 2021-10-30 NOTE — Progress Notes (Cosign Needed)
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  * acute right L3 radiculopathy  * Concern for cauda equina syndrome    DOS:  09/06/19 XLIF L3-L5, PSF Y7-W2 (Zlomislic)  9/56/21 ALIF H0-Q6, revision PSF V7-Q4 (Zlomislic)    Chief Complaint: acute on chronic back pain and R thigh pain    Subjective:  Doing well   Pain controlled   Still using foley   Reports pain improving     Objective:  Vital Signs:  BP 147/77 (BP Location: Right arm, BP Patient Position: Semi-Fowlers)   Pulse 76   Temp 97.8 F (36.6 C)   Resp 20   Ht 5\' 6"  (1.676 m)   Wt 91.2 kg (201 lb 1 oz)   SpO2 97%   BMI 32.45 kg/m     Physical Exam:  General: patient awake, alert, and responding to commands; no apparent distress  Cardio: regular rate and rhythm per pulse  Respiratory: patient breathing comfortably without use of accessory muscles    Bilateral Lower Extremity   Motor:       Right           Left        Iliopsoas (L2/3)                     5/5           5/5         Quadriceps (L4)                    5/5           5/5        Tibialis Anterior (L4/5)           5/5           5/5        EHL (L5)                               5/5           5/5        GSC (S1)                              5/5           5/5   Sensation:        SILT in L2-S1 distributions.   Special Tests:        Downgoing plantar response. No clonus.    Vascular Exam:        Warm and well perfused distally    Laboratory Data:   Lab Results   Component Value Date    WBC 9.8 10/29/2021    HGB 15.9 10/29/2021    HCT 45.2 10/29/2021    PLT 191 10/29/2021     Lab Results   Component Value Date    NA 143 10/29/2021    K 3.5 10/29/2021    CL 102 10/29/2021    BICARB 26 10/29/2021    BUN 18 10/29/2021    CREAT 1.10 10/29/2021    GLU 126 (H) 10/29/2021    [place] 9.8 10/29/2021     Lab Results   Component Value Date    INR 1.0 10/29/2021    PTT 30 10/29/2021  Input/Output:    Intake/Output Summary (Last 24 hours) at  10/30/2021 1653  Last data filed at 10/29/2021 2300  Gross per 24 hour   Intake --   Output 1900 ml   Net -1900 ml       Current Medications:  Current Facility-Administered Medications   Medication   . acetaminophen (TYLENOL) tablet 975 mg   . allopurinol (ZYLOPRIM) tablet 300 mg   . amLODIPINE (NORVASC) tablet 10 mg   . ascorbic acid (VITAMIN C) liquid 500 mg   . dexAMETHasone (DECADRON) PF injection 6 mg   . DULoxetine (CYMBALTA) CR capsule 20 mg   . ketOROLAC (TORADOL) injection 15 mg   . LORazepam (ATIVAN) tablet 1 mg   . oxyCODONE (ROXICODONE) tablet 5 mg    Or   . morphine injection 2 mg   . morphine injection 4 mg   . nalOXone (NARCAN) injection 0.1 mg   . oxyCODONE (ROXICODONE) tablet 10 mg    Or   . oxyCODONE (ROXICODONE) tablet 15 mg   . pregabalin (LYRICA) capsule 100 mg   . sodium chloride 0.9% infusion   . zolpidem (AMBIEN) tablet 5 mg       Assessment:   71 year old male   s/p above issue patient doing well and progressing appropriately.   - Acute Blood Loss Anemia with component of hemodilution    Plan:  - PT: OOBTC no restrictions  - Pain Medication: Multimodal if pain persists consut acute pain   - Diet: regular  - Foley: keep foley until dc by urology in 1 week in urology clinic  - Other: aggressive incentive spirometry  - Dispo: pending, PT clearance, pain control on oral pain medication, CM clearance, PT clearance (need for continued inpatient physical therapy evaluation to assess patient's mobility, in order to ensure a safe dispo plan)    Please page the Orthopaedic Spine Surgery Team with questions or concerns based on patient location:     Established spine floor patients at [place]: Ortho Spine 1 - [phone]      Established spine floor patients at [facility]: Ortho Spine 2 - [phone]      New spine consultations not yet followed by spine team:    View [facility] on call for "SPINE CONSULT" call schedule (Orthopaedic Spine versus Neurosurgery)   If Orthopaedic Spine is on call  please page the [place] on call:   [place]: ORTHOPEDICS/TH   [place]: ORTHOPEDICS/HC

## 2021-10-30 NOTE — ED Notes (Signed)
Bed: 39  Expected date:   Expected time:   Means of arrival:   Comments:  13

## 2021-10-30 NOTE — Event / Update (Signed)
Brief GU Note    Urology asked to evaluate acute urinary retention.     Ortho has ruled out CE syndrome.     Per primary team he states that he normally retains urine; per RN reports he has PVRs >1000 on scan and 1500cc with straight cath.     Discussed that a foley catheter should be placed and maintained at least 7d before repeat void trial attempt. Okay to start flomax 0.4mg  nightly if not contraindicated.    Please page urology prior to discharge to arrange for void trial in clinic.    Alecia Lemming, MD, MBA  Urology, PGY-3  [facility]

## 2021-10-30 NOTE — Interdisciplinary (Signed)
10/30/21 1323   Initial Assessment   CM Initial Assessment * Completed   Readmission Risk Assessment   Readmission Within 30 Days of Discharge * No   Recent Hospitalizations (Within Last 6 Months) * No   High Risk For Readmission * Yes   High Risk Indicators Anticipated long term health care needs (e.g. new diabetic, CHF, Stroke, MI, etc.)   Action Taken To Prevent Readmission After Discharge * PT OT ST evaluation and recommendation   Recommendations to the Physician* Case management consult;Social worker evaluation   Patient Information   Where was the patient admitted from? * Home   Address on Facesheet correct?* Yes   Patient contact phone number on Facesheet correct? Yes   PCP listed on Facesheet correct? Yes   Prior to Level of Function * Use Assisted Devices;Independent with ADL's   [place] [facility] * Yes   Type of Home Care Services on Admission Home PT   Home Service Name,  Address and Phone Number on Admission [facility]   Additional Services on Admission Not Applicable   Available Assistance/Support System  and Prior Community Resources Family member(s)   Primary Caretaker(s) * Self;Family   Primary Family/Caregiver Contact Name, Number and Relationship * [name], sister, [phone]   Permission to Contact * Yes   Social Worker Consult   Do you need to see a Education officer, museum? * No   Income Information   Income Source Product manager (Comment)  Estevan Ryder)   Military History Not Applicable   Veterans Affiliation No   Do you have difficulty affording your medications No   Discharge Planning   Living Arrangements * Family Member   Type of Residence * One Story Home   Stairs/Steps to home  No   Patient's Discharge Goal(s) Home Health;Home   Anticipated Discharge Disposition/Needs Home with Family;Home   Anticipated DischargeTransportation *  Taxi   Anticipated Discharge Transportation Details will need transport set up   Barriers to  Discharge * None   Patient Engaged in Discharge Planning * Yes   Others Engaged in Discharge Plan: Name, Relationship and Phone Number patient   Family/Caregiver's Assessed for * Readiness to provide care to the patient;Readiness, willingness, and ability to provide or support self-management activities   Respite Care * Not Applicable   Patient/Family/Other Are In Agreement With Discharge Plan * To be determined   Public Health Clearance Needed * Not Applicable     Medical Necessity: low back pain    LOS at time of Initial Assessment: 1 Day 7 Hours  Pt admitted on 10/29/2021  5:51 AM    LACE+ Score: 70    Address verified as discharge address: verified with pt  [place] [address] - [phone] (home)    PCP verified:  [name]  [address] / [place] [address]  telephone [phone]  fax [phone]    [id number] [address]    PLOF:  iADL's and uses cane    Hx of SNF placement:  none    Hx of Home health services:  [facility]   - Use previous home health agency upon discharge (yes/no): yes    DME (includes ALL home equipment): cane    Hx of HD/PD: n/a   Maintenance Dialysis History    Patient has no recorded history of maintenance dialysis.     Clinic:  Address:  Phone:  Chair schedule:  Transport:  Verified by:     DISCHARGE PLANNING    Support system:  CM met with pt at bedside.  He stated that he lives in a [facility] with her sister and brother in [place].  He stated that he has had [facility] in the past but does not feel that he needs it until after the surgery and then he wants to use [facility] again.     Anticipated DC disposition (home, SNF, etc) :  home with family    Anticipated DC needs (HH, DME, none, etc):  pending PT/OT/MD recs.  Pt declined [place] until after surgery and then he wants to use [facility] again.      Anticipated barriers to discharge:   none    Transportation: will need transport set up            Expected discharge  date:  pending clinical progression       Marlon Pel, RN

## 2021-10-31 LAB — MRSA SURVEILLANCE CULTURE

## 2021-10-31 NOTE — Progress Notes (Cosign Needed)
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  * acute right L3 radiculopathy  * Concern for cauda equina syndrome    DOS:  09/06/19 XLIF L3-L5, PSF X3-G1 (Zlomislic)  08/16/92 ALIF Z1-I9, revision PSF C7-E9 (Zlomislic)    Chief Complaint: acute on chronic back pain and R thigh pain    Subjective:  Doing well   Pain controlled   Still using foley   Reports pain improving  However had flare up last night and wants surgery     Objective:  Vital Signs:  BP 157/80 (BP Location: Right arm, BP Patient Position: Semi-Fowlers)   Pulse 63   Temp 97.9 F (36.6 C)   Resp 17   Ht 5\' 6"  (1.676 m)   Wt 91.2 kg (201 lb 1 oz)   SpO2 97%   BMI 32.45 kg/m     Physical Exam:  General: patient awake, alert, and responding to commands; no apparent distress  Cardio: regular rate and rhythm per pulse  Respiratory: patient breathing comfortably without use of accessory muscles    Bilateral Lower Extremity   Motor:       Right           Left        Iliopsoas (L2/3)                     5/5           5/5         Quadriceps (L4)                    5/5           5/5        Tibialis Anterior (L4/5)           5/5           5/5        EHL (L5)                               5/5           5/5        GSC (S1)                              5/5           5/5   Sensation:        SILT in L2-S1 distributions.   Special Tests:        Downgoing plantar response. No clonus.    Vascular Exam:        Warm and well perfused distally    Laboratory Data:   Lab Results   Component Value Date    WBC 9.8 10/29/2021    HGB 15.9 10/29/2021    HCT 45.2 10/29/2021    PLT 191 10/29/2021     Lab Results   Component Value Date    NA 143 10/29/2021    K 3.5 10/29/2021    CL 102 10/29/2021    BICARB 26 10/29/2021    BUN 18 10/29/2021    CREAT 1.10 10/29/2021    GLU 126 (H) 10/29/2021    [place] 9.8 10/29/2021     No results found for: INR, PTT      Input/Output:  Intake/Output Summary (Last 24 hours) at  10/31/2021 1838  Last data filed at 10/31/2021 1739  Gross per 24 hour   Intake 3828.75 ml   Output 2200 ml   Net 1628.75 ml       Current Medications:  Current Facility-Administered Medications   Medication   . acetaminophen (TYLENOL) tablet 975 mg   . allopurinol (ZYLOPRIM) tablet 300 mg   . amLODIPINE (NORVASC) tablet 10 mg   . ascorbic acid (VITAMIN C) liquid 500 mg   . dexAMETHasone (DECADRON) PF injection 6 mg   . DULoxetine (CYMBALTA) CR capsule 20 mg   . ketOROLAC (TORADOL) injection 15 mg   . LORazepam (ATIVAN) tablet 1 mg   . oxyCODONE (ROXICODONE) tablet 5 mg    Or   . morphine injection 2 mg   . nalOXone (NARCAN) injection 0.1 mg   . oxyCODONE (ROXICODONE) tablet 10 mg    Or   . oxyCODONE (ROXICODONE) tablet 15 mg   . pregabalin (LYRICA) capsule 100 mg   . sodium chloride 0.9% infusion   . zolpidem (AMBIEN) tablet 5 mg       Assessment:   70 year old male   s/p above issue patient doing well and progressing appropriately.   - Acute Blood Loss Anemia with component of hemodilution    Plan:  - PT: OOBTC no restrictions  - Pain Medication: Multimodal if pain persists consut acute pain   - Diet: regular  - Foley: keep foley until dc by urology in 1 week in urology clinic  - Other: aggressive incentive spirometry  - Dispo: pending, PT clearance, pain control on oral pain medication, CM clearance, PT clearance (need for continued inpatient physical therapy evaluation to assess patient's mobility, in order to ensure a safe dispo plan)  -possible or this admission pending OR availability     Please page the Orthopaedic Spine Surgery Team with questions or concerns based on patient location:     Established spine floor patients at [name]: Ortho Spine 1 - [phone]      Established spine floor patients at [facility]: Ortho Spine 2 - [phone]      New spine consultations not yet followed by spine team:    View [facility] on call for "SPINE CONSULT" call schedule (Orthopaedic Spine versus  Neurosurgery)   If Orthopaedic Spine is on call please page the [place] on call:   [place]: ORTHOPEDICS/TH   [place]: ORTHOPEDICS/HC

## 2021-10-31 NOTE — Plan of Care (Signed)
Problem: Promotion of Health and Safety  Goal: Promotion of Health and Safety  Description: The patient remains safe, receives appropriate treatment and achieves optimal outcomes (physically, psychosocially, and spiritually) within the limitations of the disease process by discharge.    Information below is the current care plan.  Outcome: Not Progressing  Flowsheets  Taken 10/31/2021 1406  Guidelines: Inpatient Nursing Guidelines  Individualized Interventions/Recommendations #1: Monitor pain level. Administer multimodal pain regimen.  Individualized Interventions/Recommendations #2 (if applicable): Assist with OOB activity. Collaborate with PT.  Outcome Evaluation (rationale for progressing/not progressing) every shift: Patient A&Ox4. In NAD. VS w/n ordered parameters. MAE x4 strong/equal. Denies numbness/tingling. Reports lower back and right leg pain. Medicated w/ PRN Roxicodone and scheduled Toradol, Tylenol and decadron. Patient participated with PT this afternoon.   Taken 10/31/2021 7253  Patient /Family stated Goal: pain control  Note:

## 2021-10-31 NOTE — Interdisciplinary (Signed)
Physical Therapy Evaluation    Admitting Physician:  Zlomislic, Vinko, MD  Admission Date 10/29/2021    Inpatient Diagnosis:   Problem List       Codes    Decreased functional mobility     ICD-10-CM: R26.89  ICD-9-CM: 781.99          IP Start of Service   Start of Care: 10/31/21  Onset Date: 10/29/2021  Reason for referral: Decline in functional ability/mobility    Preferred [place]         Past Medical History:   Diagnosis Date   . Chronic back pain    . Congenital hydronephrosis    . Gout    . Headache    . Hematuria    . Kidney disease    . Kidney stones    . Major depressive disorder, single episode    . Polyarthropathy or polyarthritis of multiple sites    . Retinal detachment    . Urethral stricture       Past Surgical History:   Procedure Laterality Date   . CT INSERTION OF SUPRAPUBIC CATH  09/25/2015   . NEPHRECTOMY Right 1995   . APPENDECTOMY     . COLONOSCOPY     . CYSTOSCOPY     . CYSTOSCOPY W/ LASER LITHOTRIPSY     . OTHER SURGICAL HISTORY      Interstim 01/29/2011   . SPINE SURGERY  09/21    Lumbar-sacral fusion   . 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 Sister able to assist as needed upon d/c     Row Name 10/31/21 1400          Subjective    Subjective Information Pt states that he is having a lot of pain in his R thigh but would like to try to walk     Patient status Nursing in agreement for treatment;Patient agreeable to treatment     [place] Name 10/31/21 1400  Pain Assessment    Pain Asssessment Tool Numeric Pain Rating Scale     Row Name 10/31/21 1400          Numeric Pain Rating Scale    Pain Intensity - rating at present 10     Pain Intensity- rating after treatment 10     Location R anterior thigh     Row Name 10/31/21 1400          Objective    Overall Cognitive Status Intact - no cognitive limitations or impairments noted     Other  Cognitive Status Information decreased safety awareness     Communication No communication limitations or impairments noted. Current status of hearing, speech and vision allow functional communication.     Coordination/Motor control No limitations or impairments noted. Movement patterns are fluid and coordinated throughout     Balance Balance limitations present     Static Sitting Balance Normal - able to maintain steady balance without handhold support     Dynamic Sitting Balance Good - accepts moderate challenge, able to maintain balance while picking object off floor     Static Standing Balance Good - able to maintain balance without handhold support, limited postural sway     Dynamic Standing Balance Fair - accepts minimal challenge, able to maintain balance while turning head/trunk     Other Balance Information Utilized FWW for standing balance     Extremity Assessment Range of motion, strength,  muscle tone and/or sensation  limitations present     LLE findings grossly 5/5, sensation intact     RLE findings grossly 5/5, sensation intact     Functional Mobility Functional mobility deficits present     Bed Mobility Comments NT, started and ended session sitting at EOB. Educated on use of log roll technique.     Transfers to/from Stand Modified independent     Transfer Comments with FWW     Gait Modified independent     Gait Comments Pt ambulates with decreased gait speed and forward flexed posture. Pt stops every few feet to lean forward/rest forearms on FWW. Informed pt that this is unsafe and encouraged to take seated rest breaks as opposed to leaning. Distance limited by R thigh pain.     Device used for [employer] 20 ft. Frequent standing rest breaks.     Other Objective Findings Educated on spine precautions and encouraged to maintain spine precautions to prevent exacerbating symptoms. Educated on safety and importance of pacing.                      Eval cont.     [place] Name 10/31/21 1400          [place] AM-PAC: Basic Mobility    Assistance Needed to Turn from Back to Side While in a Flat Bed Without Using Bedrails 4 - None (independent)     Difficulty with Supine to Sit Transfer 4 - None (independent)     How Much Help Needed to Move to/from Bed to Chair 4 - None (independent)     Difficulty with Sit to Stand Transfer from Chair with Arms 4 - None (independent)     How Much Help Needed to Walk in Room 4 - None (independent)     How Much Help Needed to Climb 3-5 Steps with a Rail 3 - A little (supervised/min assist)     AMPAC Total Score 23  Assessment: AM-PAC Basic Mobility Impairment Rating Score 23 - 1-19% impaired     Row Name 10/31/21 1400          Patient/Family Education    Learner(s) Patient     Learner response to rehab patient education interventions Verbalizes understanding     Patient/family training comments PT role in care, PT [employer] Name 10/31/21 1400           Assessment    Assessment Stephen [name] is a 70 year old male with PMH notable for above spine surgery, solitary kidney, transurethral resection of the prostate recurrent bladder neck contracture, and 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 Pt demonstrates deficits in functional mobility secondary to RLE pain, which is preventing pt from ambulating >20 ft. However, pt does not currently require any assistance for mobility. Therefore, will place pt on hold for PT and follow up once plan for hospital stay/possible surgery is established. If pt is to have surgery, PT will follow up post-op as appropriate. At this time, would rec d/c home with assist from sister, FWW, and OP PT once medically stable.     Rehab Potential Good     Row Name 10/31/21 1400          Patient stated Goal    Patient stated goal to have surgery so his pain improves     Row Name 10/31/21 1400          Goal 1 (Short Term)    Impairment Gait impairment     Custom goal Pt will be able to walk 150 ft with LRAD and SBA to allow for safe community ambulation     Number of visits 3-5     Goal Status New     Row Name 10/31/21 1400          Planned Therapy Interventions and Rationale    Gait Training to normalize gait pattern and improve safety while ambulating     Neuromuscular Re-Education to improve safety during dynamic activities;to improve kinesthetic awareness and postural control     Therapeutic Activities to improve functional mobility and ability to navigate in the home and/or community;to improve transfers between surfaces     Theraputic Exercise to increase strength to allow greater independence with functional mobility skills;to improve activity tolerance to allow greater independence with functional mobility skills     Row Name 10/31/21 1400          Treatment Plan Disussion    Treatment Plan Discussion and Agreement  Patient/family/caregiver stated understanding and agreement with the therapy plan     [place] Name 10/31/21 1400          Treatment Plan    Frequency of treatment Hold therapy at this time     [place] Name 10/31/21 1400          Patient Safety Considerations    Patient safety considerations Patient left sitting at end of treatment;Call light left in reach and fall precautions in place;Patient may be at risk for falls;Nursing notified of safety considerations at end of treatment     Patient assistive device requirements for safe ambulation [place] Name 10/31/21 1400          Therapy Plan Communication    Therapy Plan Communication Discussed therapy plan with Nursing and/or Physician;Encouraged out of bed with assistance by  Encouraged out of bed with assistance by Nursing;Staff     Row Name 10/31/21 1400          Physical Therapy Patient Discharge Instructions    Your Physical Therapist suggests the following Continue to follow your prescribed mobility precautions when moving in and out of bed and walking  as instructed     Row Name 10/31/21 1500          Type of Eval    Low Complexity [phone]) Completed     Row Name 10/31/21 1500          Therapeutic Procedures    Gait Training [phone]) Dynamic activities while walking;Assistive device training;Gait pattern analysis and treatment of deviations;Patient education        Total TIMED Treatment (min)  15     Row Name 10/31/21 1400          Treatment Time     Total TIMED Treatment  (min) 15     Total Treatment Time (min) 30     Treatment start time 1400               Post Acute Discharge Recommendations  Discharge Rehabilitation Recommendations : If available, recommend discharge to supervised living situation;Patient requires assistance;If available, patient would benefit from ongoing therapy  Level of Care : Home with assitance  Therapy type : Outpatient  Current Level of Assistance : Modified Independent with device  Equipment recommendations: Loyal Buba  Justification: Patient safety and mobility is enhanced by the use of a walker. Patient has indicated agreement to utilize the walker during Mobility Related Activities of Daily Living (MRADLs) and is able to complete MRADLs in a more timely manner using a walker.    The physical therapist of record is endorsed by evaluating physical therapist.

## 2021-11-01 ENCOUNTER — Inpatient Hospital Stay (HOSPITAL_BASED_OUTPATIENT_CLINIC_OR_DEPARTMENT_OTHER): Payer: Medicare Other

## 2021-11-01 ENCOUNTER — Telehealth (HOSPITAL_BASED_OUTPATIENT_CLINIC_OR_DEPARTMENT_OTHER): Payer: Self-pay | Admitting: Urology

## 2021-11-01 ENCOUNTER — Ambulatory Visit (INDEPENDENT_AMBULATORY_CARE_PROVIDER_SITE_OTHER): Payer: Medicare Other | Admitting: Student in an Organized Health Care Education/Training Program

## 2021-11-01 DIAGNOSIS — M545 Low back pain, unspecified: Secondary | ICD-10-CM

## 2021-11-01 LAB — TYPE & SCREEN
ABO/RH: A POS
Antibody Screen: NEGATIVE

## 2021-11-01 MED ORDER — PREGABALIN 100 MG OR CAPS
100.0000 mg | ORAL_CAPSULE | Freq: Two times a day (BID) | ORAL | Status: DC
Start: 2021-11-01 — End: 2021-11-02
  Administered 2021-11-01 – 2021-11-02 (×3): 100 mg via ORAL
  Filled 2021-11-01 (×2): qty 1

## 2021-11-01 NOTE — Telephone Encounter (Signed)
SURGERY: Procedure: Left ureteroscopy, possible biopsy, with laser fulguration of lesion, possible cytology washings, possible insertion of ureteral stent     Surgeon: Dr. Sallyanne Kuster    Received surgical order in Epic from Dr. Sallyanne Kuster     I contacted patient at [phone] to discuss surgery scheduling, there's no answer.     I left a detailed message and provided patient with my direct number to return my call to coordinate.    1st attempt to contact pt, sent MyChart msg as well    Pt currently in the hospital

## 2021-11-01 NOTE — Discharge Instructions (Addendum)
Surgeon:   Dr. Illa Level Zlomislic      Follow-up Appointment:  Your clinic appointment will be:    Future Appointments   Date Time Provider [place]   11/06/2021  1:00 PM TH MOBILE MRI [facility] MRI [place]   54/08/8118  1:47 PM Zlomislic, Illa Level, MD [facility]       . You should see [name] in clinic within 1 week from hospital discharge.   If you need to get ahold of spine clinic the number is [phone]    A surgery order will be put in by Dr. Illa Level Zlomislic to get surgery scheduled as an outpatient.     A referral for urology was placed for you to follow-up in 1 week.  The urology clinic number is [phone]      Reasons to Contact a Doctor Urgently:     Call 911 or return to the hospital immediately if: If you have chest pain, sudden onset shortness of breath, difficulty breathing, dizziness, lightheadedness, change in mental status or any other concerning issues.    If you develop a fever (>101), redness or drainage from the surgical incision site, swelling or redness of an extremity, profuse bleeding or excessive drainage from incisional site, or other concerning issues please call our office to arrange for an evaluation or go to the emergency department.     If you have any questions about your hospital care, your medications, or if you have new or concerning symptoms soon after going home from the hospital, your hospital physician can be contacted in the following manner:   [facility] operator at [phone].  [place] Phone Number: [phone] ([place]) or [phone] [facility]).    Once you are able to see your primary care physician (PCP), your PCP will then be responsible for further medication refills, or appointment referrals.      General Instructions for After Discharge    Your activity level at home should be:  As tolerated, no heavy lifting or twisting    Diet:  Your diet at home should be a regular diet.      Medications:    Take medications as  prescribed. The short acting narcotic pain medication (usually Oxycodone) can be continued as needed, not to exceed the directions on the prescription (usually up to every 4 hours or 6 pills per day). To help wean off of the pain medications or to supplement your pain control you can use Tylenol to help with pain. If you run out of medication before your follow up visit, please let our clinic know so that we may authorize a refill if indicated. Please keep in mind it may take up to 72 hours for a refill to be authorized.      Your medication list is located on this After Visit Summary in the Current Discharge Medication List section.  Your nurse will review this information with you before you leave the hospital.    It is very important for you to keep a current medication list with you in order to assist your doctors with your medical care.  Bring this After Visit Summary with you to your follow up appointments.    While you are taking the narcotic pain medication, take the stool softener and drink plenty of water to prevent constipation.    Do not drive while on narcotic medications.    Do not mix narcotic medications with alcohol.       What Needs to Happen  Next After Discharge -- Appointments and Follow Up    You should have an office appointment about 1- 2 weeks following your discharge from the hospital. If you do not please call [phone] ([place]) or [phone] [facility]).     Generally, you should return to see your surgeon at the following intervals, but this may be individualized depending on special circumstances.    Any appointments already scheduled at [place] clinics will be listed in the Future Appointments section at the top of this After Visit Summary.  Any appointments that have been requested, but have not yet been scheduled, will be listed below that under Post Discharge Referrals.

## 2021-11-01 NOTE — Interdisciplinary (Signed)
11/01/21 1059   Follow Up/Progress   Is the Patient Medically Stable for Discharge * No   CM Discharge Arrangements Complete No   Barriers to Discharge * None   Where was the patient admitted from? * Home   Anticipated Discharge Disposition/Needs Home   Post Acute Services Referred To Not Applicable   Respite Care * Not Applicable   Patient/Family/Other Are In Agreement With Discharge Plan * To be determined   Public Health Clearance Needed * Not Applicable   Anticipated DischargeTransportation *  Taxi   Anticipated Discharge Transportation Details will need transport set up   11/01/21  11:03 AM    Medical Intervention(s) requiring continued Hospital Stay:  Pain control, ambulation    Anticipated discharge plan/needs:  Pt declined FWW    Barriers to Discharge:  none    Action Steps/Follow Up:  Pending medical clearance    CM met with pt at bedside. PT recommended FWW. Patient declined walker. He stated at his home he does not have room to use a walker and he feels he will be able to manage with his cane. CM discussed PT recommendations for FWW for safety, pt continued to decline.     Charlaine Dalton, RN  Care Manager

## 2021-11-01 NOTE — Progress Notes (Cosign Needed)
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  * acute right L3 radiculopathy  * Concern for cauda equina syndrome    DOS:  09/06/19 XLIF L3-L5, PSF C5-E5 (Zlomislic)  2/77/82 ALIF U2-P5, revision PSF T6-R4 (Zlomislic)    Chief Complaint: acute on chronic back pain and R thigh pain    Subjective:  Doing well   Pain controlled   Still using foley   Reports pain improving  Got out of bed   Wants surgery   Have explained that or availability and staffing issues are making that extremely difficult     Objective:  Vital Signs:  BP 151/69 (BP Location: Left arm, BP Patient Position: Semi-Fowlers)   Pulse 67   Temp 98.3 F (36.8 C)   Resp 18   Ht 5\' 6"  (1.676 m)   Wt 91.2 kg (201 lb 1 oz)   SpO2 95%   BMI 32.45 kg/m     Physical Exam:  General: patient awake, alert, and responding to commands; no apparent distress  Cardio: regular rate and rhythm per pulse  Respiratory: patient breathing comfortably without use of accessory muscles    Bilateral Lower Extremity   Motor:       Right           Left        Iliopsoas (L2/3)                     5/5           5/5         Quadriceps (L4)                    5/5           5/5        Tibialis Anterior (L4/5)           5/5           5/5        EHL (L5)                               5/5           5/5        GSC (S1)                              5/5           5/5   Sensation:        SILT in L2-S1 distributions.   Special Tests:        Downgoing plantar response. No clonus.    Vascular Exam:        Warm and well perfused distally    Laboratory Data:   Lab Results   Component Value Date    WBC 9.8 10/29/2021    HGB 15.9 10/29/2021    HCT 45.2 10/29/2021    PLT 191 10/29/2021     Lab Results   Component Value Date    NA 143 10/29/2021    K 3.5 10/29/2021    CL 102 10/29/2021    BICARB 26 10/29/2021    BUN 18 10/29/2021    CREAT 1.10 10/29/2021    GLU 126 (H) 10/29/2021    [place] 9.8 10/29/2021     No  results found  for: INR, PTT      Input/Output:    Intake/Output Summary (Last 24 hours) at 11/01/2021 0805  Last data filed at 11/01/2021 0600  Gross per 24 hour   Intake 3515 ml   Output 2700 ml   Net 815 ml       Current Medications:  Current Facility-Administered Medications   Medication   . acetaminophen (TYLENOL) tablet 975 mg   . allopurinol (ZYLOPRIM) tablet 300 mg   . amLODIPINE (NORVASC) tablet 10 mg   . ascorbic acid (VITAMIN C) liquid 500 mg   . dexAMETHasone (DECADRON) PF injection 6 mg   . DULoxetine (CYMBALTA) CR capsule 20 mg   . ketOROLAC (TORADOL) injection 15 mg   . LORazepam (ATIVAN) tablet 1 mg   . oxyCODONE (ROXICODONE) tablet 5 mg    Or   . morphine injection 2 mg   . nalOXone (NARCAN) injection 0.1 mg   . oxyCODONE (ROXICODONE) tablet 10 mg    Or   . oxyCODONE (ROXICODONE) tablet 15 mg   . pregabalin (LYRICA) capsule 100 mg   . sodium chloride 0.9% infusion   . zolpidem (AMBIEN) tablet 5 mg       Assessment:   70 year old male   s/p above issue patient doing well and progressing appropriately.   - Acute Blood Loss Anemia with component of hemodilution    Plan:  - PT: OOBTC no restrictions  - Pain Medication: Multimodal if pain persists consut acute pain   - Diet: regular  - Foley: keep foley until dc by urology in 1 week in urology clinic  - Other: aggressive incentive spirometry  - Dispo: pending, PT clearance, pain control on oral pain medication, CM clearance, PT clearance (need for continued inpatient physical therapy evaluation to assess patient's mobility, in order to ensure a safe dispo plan)  -possible or this admission pending OR availability     Please page the Orthopaedic Spine Surgery Team with questions or concerns based on patient location:     Established spine floor patients at [place]: Ortho Spine 1 - [phone]      Established spine floor patients at [facility]: Ortho Spine 2 - [phone]      New spine consultations not yet followed by spine team:    View [facility] on call for  "SPINE CONSULT" call schedule (Orthopaedic Spine versus Neurosurgery)   If Orthopaedic Spine is on call please page the [place] on call:   [place]: ORTHOPEDICS/TH   [place]: ORTHOPEDICS/HC

## 2021-11-02 ENCOUNTER — Other Ambulatory Visit: Payer: Self-pay

## 2021-11-02 MED ORDER — PREGABALIN 100 MG OR CAPS
ORAL_CAPSULE | ORAL | 0 refills | Status: DC
Start: 2021-11-02 — End: 2021-11-16
  Filled 2021-11-02: qty 30, 30d supply, fill #0

## 2021-11-02 MED ORDER — INFLUENZA VAC 65+ YRS WRAPPED RECORD (~~LOC~~)
1.0000 | PREFILLED_SYRINGE | INTRAMUSCULAR | Status: AC
Start: 2021-11-02 — End: 2021-11-02

## 2021-11-02 MED ORDER — NALOXONE HCL 4 MG/0.1ML NA LIQD
NASAL | 0 refills | Status: DC
Start: 2021-11-02 — End: 2023-01-23
  Filled 2021-11-02: qty 2, 1d supply, fill #0

## 2021-11-02 MED ORDER — METHYLPREDNISOLONE 4 MG OR KIT
ORAL_TABLET | ORAL | 0 refills | Status: DC
Start: 2021-11-02 — End: 2021-11-11
  Filled 2021-11-02: qty 21, 6d supply, fill #0

## 2021-11-02 MED ORDER — OXYCODONE HCL 5 MG OR TABS
5.0000 mg | ORAL_TABLET | Freq: Four times a day (QID) | ORAL | 0 refills | Status: DC | PRN
Start: 2021-11-02 — End: 2021-11-11
  Filled 2021-11-02: qty 42, 7d supply, fill #0

## 2021-11-02 MED ORDER — LIDOCAINE 4 % EX PTCH
1.0000 | MEDICATED_PATCH | CUTANEOUS | 0 refills | Status: DC
Start: 2021-11-02 — End: 2021-12-01
  Filled 2021-11-02: qty 10, 10d supply, fill #0

## 2021-11-02 NOTE — Interdisciplinary (Signed)
DC home today.  PT recommending outpatient therapy and FWW.  FWW given bedside.  SW to assist with transport home. No other home DC needs reported by patient.

## 2021-11-02 NOTE — Discharge Summary (Signed)
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 Post-traumatic stricture of anterior urethra   . Bladder neck contracture   . Lumbar radiculopathy   . Cervical radiculopathy   . Incomplete bladder emptying   . Lumbosacral spondylosis without myelopathy   . Chronic SI joint pain   . Degenerative spondylolisthesis   . Lumbar stenosis with neurogenic claudication   . Pre-procedure lab exam   . Cervical spondylosis without myelopathy   . Cervical spondylosis   . S/P lumbar fusion   . Chronic left SI joint pain   . Chronic midline thoracic back pain   . Spondylolisthesis at L5-S1 level   . Left renal mass   . Malignant neoplasm of urinary bladder, unspecified site (CMS-HCC)   . Dorsopathy   . Ureter malignant neoplasm, left (CMS-HCC)   . Cauda equina compression (CMS-HCC)       Additional Hospital Diagnoses ("rule out" or "suspected" diagnoses, etc.):  acute urinary retention         Principal Procedure During This Hospitalization (required):  No surgical interventions    Other Procedures Performed During This Hospitalization (required):  MRI imaging of L spine  CT L spine    Consultations Obtained During This Hospitalization:  PT  OT  CM  Urology    Key consultant recommendations:  Therapy regimen: as tolerated  Discharge disposition: home      Hospital Course by Problem (required):  The patient was admitted to the ortho unit for closer monitoring.       On the day of discharge the patient was medically stable, urinating via foley cath, tolerating oral diet, pain well controlled, and was cleared for discharge by PT/OT. The patient was given strict return precautions, and activity restrictions and demonstrated clear understanding.      Patient will follow-up in 1- 2 weeks in clinic.    Tests Outstanding at Discharge  Requiring Follow Up:  follow up in uro clinic for void trial   Follow up in spine clinic for surgery planning     Discharge Condition (required):  Stable    Key Physical Exam Findings at Discharge:  Exam preformed by Dr. Mamie Nick  General: patient awake, alert, and responding to commands; no apparent distress  Cardio: regular rate and rhythm per pulse  Respiratory: patient breathing comfortably without use of accessory muscles    Bilateral Lower Extremity              Motor:                                         Right           Left                   Iliopsoas (L2/3)                     5/5              5/5                            Quadriceps (  L4)                    5/5              5/5                   Tibialis Anterior (L4/5)           5/5             5/5                   EHL (L5)                               5/5              5/5                   GSC (S1)                              5/5              5/5              Sensation:                   SILT in L2-S1 distributions.              Special Tests:                   Downgoing plantar response. No clonus.               Vascular Exam:                   Warm and well perfused distally      Discharge Diet:  reg    Discharge Medications:     What To Do With Your Medications      START taking these medications      Add'l Info   Lidocaine Pain Relief 4 % patch  Apply 1 patch topically every 24 hours. Leave patch on for 12 hours, then remove for 12 hours.  Generic drug: lidocaine   Quantity: 15 patch  Refills: 0     methylPREDNISolone 4 MG tablet  Commonly known as: MEDROL DOSEPACK  Take as directed on package   Quantity: 21 each  Refills: 0     naloxone 4 mg/0.1 mL nasal spray  Commonly known as: NARCAN  For suspected opioid overdose, call 911! Then spray once in one nostril. Repeat after 3 minutes if no or minimal response using a new spray in other nostril.   Quantity: 2 each  Refills: 0     oxyCODONE 5 MG immediate release tablet  Commonly known as:  ROXICODONE  Take 1 to 2 tablets by mouth every 6 hours as needed for Moderate Pain (Pain Score 4-6) or Severe Pain (Pain Score 7-10)   Quantity: 45 tablet  Refills: 0        CHANGE how you take these medications      Add'l Info   pregabalin 100 MG capsule  Commonly known as: LYRICA  TAKE 1 CAPSULE BY MOUTH EVERY NIGHT AT BEDTIME   Quantity: 30 capsule  Refills: 0  What changed:    additional instructions   Another medication with the same name was removed. Continue taking this  medication, and follow the directions you see here.        CONTINUE taking these medications      Add'l Info   acetaminophen 325 MG tablet  Commonly known as: TYLENOL  Take 2 tablets (650 mg) by mouth every 4 hours as needed for Mild Pain (Pain Score 1-3).   Quantity: 30 tablet  Refills: 0     [place] 0.025 % ophthalmic solution  INSTILL 1 DROP INTO BOTH EYES TWICE A DAY AS DIRECTED  Generic drug: ketotifen   Refills: 0     allopurinol 300 MG tablet  Commonly known as: ZYLOPRIM  Take 300 mg by mouth daily.   Refills: 0     amLODIPINE 5 MG tablet  Commonly known as: NORVASC  Take 10 mg by mouth daily.   Refills: 0     ascorbic acid 500 MG/5ML suspension  Commonly known as: VITAMIN C   Refills: 0     DULoxetine 20 MG CR capsule  Commonly known as: CYMBALTA  Take 1 capsule (20 mg) by mouth daily.   Quantity: 90 capsule  Refills: 3     LORazepam 1 MG tablet  Commonly known as: ATIVAN  Take 1 tablet (1 mg) by mouth every 6 hours as needed for Anxiety.   Quantity: 2 tablet  Refills: 0     senna 8.6 MG tablet  Commonly known as: SENOKOT  Take 1 tablet (8.6 mg) by mouth daily.   Quantity: 30 tablet  Refills: 0     SYSTANE ULTRA OP   Refills: 0     triamcinolone 0.1 % cream  Commonly known as: KENALOG  Apply 1 Application topically 2 times daily. Apply a thin layer as directed   Refills: 0     VITAMIN D PO  Take 125 mg by mouth daily.   Refills: 0     zolpidem 5 MG tablet  Commonly known as: AMBIEN  Take 1 tablet (5 mg) by mouth nightly as needed for  Insomnia.   Quantity: 30 tablet  Refills: 0           Where to Get Your Medications      These medications were sent to [facility] [id number], [place] [address]    Hours: Mon-Fri: 8:30am-7:00pm; Sat-Sun & Holidays: 9:00am-5:00pm Phone: [phone]    Lidocaine Pain Relief 4 % patch   methylPREDNISolone 4 MG tablet   naloxone 4 mg/0.1 mL nasal spray   oxyCODONE 5 MG immediate release tablet   pregabalin 100 MG capsule         Allergies:  Amoxicillin and Sulfa drugs    Discharge Disposition:  Home.    Discharge Code Status:  Full code / full care  This code status is not changed from the time of admission.    Follow Up Appointments:    Scheduled appointments:  Future Appointments   Date Time Provider [place]   11/06/2021  1:00 PM TH MOBILE MRI [facility] MRI Winona [place]ato   [id number]  0:92 PM Zlomislic, Illa Level, MD [facility]       For appointments requested for after discharge that have not yet been scheduled, refer to the [facility] Discharge Referrals section of the After Visit Summary.    Discharging 60 Contact Information:  [facility] operator at [phone].

## 2021-11-02 NOTE — Interdisciplinary (Addendum)
Patient given written and verbal d/c instructions.  Instructions included s&s to report, wound care, d/c medications, diet, activity, and f/u appointments.  Pt going home with Foley catheter. Leg bag teaching and supplies provided.  Reviewed med rec of home medications with patient.   Copy of discharge instructions given to patient.  Patient verbalizes understanding for discharge, all questions addressed.   Condition stable for discharge.   Influenza declined since already received for 2022  IV removed with tip intact x1  Pharmacy will deliver home meds to bedside  SW to arrange transportation home.

## 2021-11-02 NOTE — Plan of Care (Signed)
Problem: Promotion of Health and Safety  Goal: Promotion of Health and Safety  Description: The patient remains safe, receives appropriate treatment and achieves optimal outcomes (physically, psychosocially, and spiritually) within the limitations of the disease process by discharge.    Information below is the current care plan.  Outcome: Progressing  Flowsheets  Taken 11/02/2021 1016 by Kalman Jewels, RN  Guidelines: Inpatient Nursing Guidelines  Individualized Interventions/Recommendations #3 (if applicable): monitor I&O, critical foley in place  Outcome Evaluation (rationale for progressing/not progressing) every shift: VSS, afebrile, Ambulating in room with assistance devise. Declining PRN pain meds. Encouraged to use call light when he needs assitance getting OOB. Will continue to monitor  Taken 11/02/2021 0830 by Kalman Jewels, RN  Patient /Family stated Goal: Pain control  Taken 10/31/2021 1406 by Sherald Barge, RN  Individualized Interventions/Recommendations #1: Monitor pain level. Administer multimodal pain regimen.  Individualized Interventions/Recommendations #2 (if applicable): Assist with OOB activity. Collaborate with PT.  Note:

## 2021-11-02 NOTE — Progress Notes (Cosign Needed)
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  * acute right L3 radiculopathy  * Concern for cauda equina syndrome    DOS:  09/06/19 XLIF L3-L5, PSF W6-F6 (Zlomislic)  07/30/74 ALIF T7-G0, revision PSF F7-C9 (Zlomislic)    Chief Complaint: acute on chronic back pain and R thigh pain    Subjective:  Doing well   Pain controlled   Still using foley   Reports pain improving  Got out of bed  Surgery scheduled in outpt setting  Will be discharged today     Objective:  Vital Signs:  BP 157/83 (BP Location: Right arm, BP Patient Position: Semi-Fowlers)   Pulse 74   Temp 98 F (36.7 C)   Resp 18   Ht 5\' 6"  (1.676 m)   Wt 91.2 kg (201 lb 1 oz)   SpO2 95%   BMI 32.45 kg/m     Physical Exam:  General: patient awake, alert, and responding to commands; no apparent distress  Cardio: regular rate and rhythm per pulse  Respiratory: patient breathing comfortably without use of accessory muscles    Bilateral Lower Extremity   Motor:       Right           Left        Iliopsoas (L2/3)                     5/5           5/5         Quadriceps (L4)                    5/5           5/5        Tibialis Anterior (L4/5)           5/5           5/5        EHL (L5)                               5/5           5/5        GSC (S1)                              5/5           5/5   Sensation:        SILT in L2-S1 distributions.   Special Tests:        Downgoing plantar response. No clonus.    Vascular Exam:        Warm and well perfused distally    Laboratory Data:   Lab Results   Component Value Date    WBC 9.8 10/29/2021    HGB 15.9 10/29/2021    HCT 45.2 10/29/2021    PLT 191 10/29/2021     Lab Results   Component Value Date    NA 143 10/29/2021    K 3.5 10/29/2021    CL 102 10/29/2021    BICARB 26 10/29/2021    BUN 18 10/29/2021    CREAT 1.10 10/29/2021    GLU 126 (H) 10/29/2021    [place] 9.8 10/29/2021     No results found for: INR, PTT  Input/Output:    Intake/Output  Summary (Last 24 hours) at 11/02/2021 1121  Last data filed at 11/02/2021 1110  Gross per 24 hour   Intake 980 ml   Output 4800 ml   Net -3820 ml       Current Medications:  Current Facility-Administered Medications   Medication   . acetaminophen (TYLENOL) tablet 975 mg   . allopurinol (ZYLOPRIM) tablet 300 mg   . amLODIPINE (NORVASC) tablet 10 mg   . ascorbic acid (VITAMIN C) liquid 500 mg   . dexAMETHasone (DECADRON) PF injection 6 mg   . DULoxetine (CYMBALTA) CR capsule 20 mg   . ketOROLAC (TORADOL) injection 15 mg   . LORazepam (ATIVAN) tablet 1 mg   . oxyCODONE (ROXICODONE) tablet 5 mg    Or   . morphine injection 2 mg   . nalOXone (NARCAN) injection 0.1 mg   . oxyCODONE (ROXICODONE) tablet 10 mg    Or   . oxyCODONE (ROXICODONE) tablet 15 mg   . pregabalin (LYRICA) capsule 100 mg   . sodium chloride 0.9% infusion   . zolpidem (AMBIEN) tablet 5 mg       Assessment:   70 year old male   s/p above issue patient doing well and progressing appropriately.   - Acute Blood Loss Anemia with component of hemodilution    Plan:  - PT: OOBTC no restrictions  - Pain Medication: Multimodal if pain persists consut acute pain   - Diet: regular  - Foley: keep foley until dc by urology in 1 week in urology clinic  - Other: aggressive incentive spirometry  - Dispo: scheduled for outpt surgery have given him Jeanetts Avalos number instructed to call her after discharge. Will follow up with urology for void trial and with [name] in 1-2weeks     Please page the Orthopaedic Spine Surgery Team with questions or concerns based on patient location:     Established spine floor patients at [place]: Ortho Spine 1 - [phone]      Established spine floor patients at [facility]: Ortho Spine 2 - [phone]      New spine consultations not yet followed by spine team:    View [facility] on call for "SPINE CONSULT" call schedule (Orthopaedic Spine versus Neurosurgery)   If Orthopaedic Spine is on call please page the [place]  on call:   [place]: ORTHOPEDICS/TH   [place]: ORTHOPEDICS/HC

## 2021-11-02 NOTE — Interdisciplinary (Signed)
11/02/21 1316   Assessment   Assessment Type Discharge  (d/c transportation)   Referral Information   Referral Type Community Resources/Referrals     Verbal Consult: D/C transportation    Intervention: SW met with pt at bedside and confirmed need for transport. SW confirmed pt address and access to home upon arrival. SW created taxi voucher and provided to bedside RN to utilize once pt is fully ready to discharge. Pt expressed appreciation for time spent.      SW team remains available as needed during patient admission.

## 2021-11-02 NOTE — Telephone Encounter (Signed)
Agent called pt to schedule new pt appt, no answer. Agent lvm to please call back. CC please assist when pt calls back.

## 2021-11-02 NOTE — Plan of Care (Signed)
Problem: Promotion of Health and Safety  Goal: Promotion of Health and Safety  Description: The patient remains safe, receives appropriate treatment and achieves optimal outcomes (physically, psychosocially, and spiritually) within the limitations of the disease process by discharge.    Information below is the current care plan.  Outcome: Progressing  Flowsheets  Taken 11/02/2021 0302 by Su Ley, RN  Outcome Evaluation (rationale for progressing/not progressing) every shift:   Patient resting comfortably in bed.  Bed in low position, call ight within reach.  Patient encouraged to use call light when needing assistance or when getting up from bed.  Patient transported to have CT of spine completed   tolerated well.  STAT lab completed as ordered.  Patient with c/o pain to right leg, prn given x1 with relief noted.  Patient continues on IOV fluids as ordered   tolerating well.  Assisted patient to ambulate at side of bed x1 person via walker.  Continues with foley in place draining clear and yellow urine   tolerating well.  Will continue to monitor.  Taken 11/01/2021 2010 by Su Ley, RN  Patient /Family stated Goal:   rest   know if hes having surgery or not tomorrow  Taken 11/01/2021 1824 by Kalman Jewels, RN  Guidelines: Inpatient Nursing Guidelines  Note:

## 2021-11-03 ENCOUNTER — Other Ambulatory Visit: Payer: Self-pay

## 2021-11-03 ENCOUNTER — Telehealth (HOSPITAL_BASED_OUTPATIENT_CLINIC_OR_DEPARTMENT_OTHER): Payer: Self-pay | Admitting: Orthopaedic Surgery of the Spine

## 2021-11-03 NOTE — Telephone Encounter (Signed)
msg received in surgery scheduling pool.    Routing to [employer] to review and assist.

## 2021-11-03 NOTE — Telephone Encounter (Signed)
Population Health Team RN Case  Manager Patient Outreach 2nd Call Attempt    Placed call  to pt. No answer, left message  No calls received after two TCM outreach attempts.    Plan  Refer to [facility] triage team if [facility] eligible pt    Close case due to unable to reach pt.    Monia Sabal, BSN, RN, CCM  TOC Case Manager, Care Connections Hub    [place] [place] [facility]  [email or web address]  [phone]

## 2021-11-03 NOTE — Telephone Encounter (Signed)
Spoke with pt - states he went to ER and was supposed to be scheduled for ortho surgery, but was bumped. He is now waiting to reschedule ortho surgery, but was also advised to schedule follow up with Urology in 1 week.    Advised pt I was calling to schedule surgery with Dr. Sallyanne Kuster, but pt states he would like to focus on scheduling ortho surgery first since he's in pain.     Asked to schedule 1 week follow up appt he was recommended to schedule with Urology. Advised, per referral, appt is for void trial. Appt scheduled on 11/22 at 9:30 AM.    Pt request call back to schedule surgery with Dr. Sallyanne Kuster.    Routing to Dr. Sallyanne Kuster as Juluis Rainier. Also, please enter nurse order for void trial.

## 2021-11-03 NOTE — Telephone Encounter (Signed)
Population Health Team RN Case Manager Patient Outreach  1st Call Attempt     Notes    First TCM call to follow up post discharge from hospital.  Placed call  to pt.  No answer.  Left message and requested a call back    Plan  Follow up with 2nd attempt phone call if no call received    Stephen [name], BSN, RN, CCM  TOC Case Manager, Care Connections Hub    [facility]  [email or web address]  [phone]

## 2021-11-03 NOTE — Telephone Encounter (Signed)
Pt returned call and left VM asking what procedure is about since he was not aware.    Left msg for pt - advised per Dr. Victorino December notes, he would like to schedule surgery in about 6 weeks to look at his upper tracts and bladder. Advised I can schedule on 12/21, request call back to confirm availability. I will also send him MyChart msg.

## 2021-11-03 NOTE — Telephone Encounter (Signed)
Pt is returning the phone call for surgery scheduler. Please assist, thank you.  Urgent request         [phone]

## 2021-11-03 NOTE — Telephone Encounter (Signed)
Stephen [name]  requested callback for advice on:  Pt had MRI of lumbar 10/29/21  He is scheduled for another MRI on 11/06/21. Wants to know if he needs to have another MRI 11/06/21. Will he need to f/u with Dr Zlomislic 59/74/16 please call to advice.   Pt wants to schedule surgery.         Treating Physician:V Zlomislic       LOV notes/plan:  Plan:  Will follow up with the pain clinic for consideration of injections, facet and SI.   F/u in 6 months   Time spend reviewing chart, outside records, communication and imaging studies in preparation for visit, facet -to- face time, counseling, completing order, and documenting encounter: 30  minutes        Best number to be reached at: [phone]            Route to RN pool if requesting clinical advice, route to MA if administrative advice    If an action is needed from the call center please route to P Ortho Scheduling Pool (not directly to cc staff)

## 2021-11-03 NOTE — Telephone Encounter (Signed)
Routed to Renato Gails NP to review triage below to review and advise further.   ===============================================================================    Reason for call:   Pt had MRI of lumbar 10/29/21;   He is scheduled for another MRI on 11/06/21.   Wants to know if he needs to have another MRI 11/06/21.   Will he need to f/u with Dr Zlomislic 12/04/23   Pt wants to schedule surgery     DOS: 08/18/20 state I anterior lumbar interbody fusion (Dr. Earnest Conroy)   LOV: 09/07/21 status post lumbar fusion   NOV: 11/08/21 (discuss MRI results)     MRI Impression from 10/29/21:

## 2021-11-04 ENCOUNTER — Inpatient Hospital Stay (HOSPITAL_COMMUNITY): Payer: Medicare Other

## 2021-11-04 ENCOUNTER — Telehealth (HOSPITAL_BASED_OUTPATIENT_CLINIC_OR_DEPARTMENT_OTHER): Payer: Self-pay | Admitting: Orthopaedic Surgery of the Spine

## 2021-11-04 ENCOUNTER — Emergency Department: Admit: 2021-11-04 | Payer: Medicare Other

## 2021-11-04 ENCOUNTER — Inpatient Hospital Stay
Admission: EM | Admit: 2021-11-04 | Discharge: 2021-11-12 | DRG: 454 | Disposition: A | Discharge status: Home Health | Payer: Medicare Other | Attending: Orthopaedic Surgery of the Spine | Admitting: Orthopaedic Surgery of the Spine

## 2021-11-04 DIAGNOSIS — M48061 Spinal stenosis, lumbar region without neurogenic claudication: Secondary | ICD-10-CM

## 2021-11-04 DIAGNOSIS — C662 Malignant neoplasm of left ureter: Secondary | ICD-10-CM | POA: Diagnosis present

## 2021-11-04 DIAGNOSIS — Z6832 Body mass index (BMI) 32.0-32.9, adult: Secondary | ICD-10-CM

## 2021-11-04 DIAGNOSIS — N189 Chronic kidney disease, unspecified: Secondary | ICD-10-CM | POA: Diagnosis present

## 2021-11-04 DIAGNOSIS — D62 Acute posthemorrhagic anemia: Secondary | ICD-10-CM | POA: Diagnosis not present

## 2021-11-04 DIAGNOSIS — Z79899 Other long term (current) drug therapy: Secondary | ICD-10-CM

## 2021-11-04 DIAGNOSIS — M48062 Spinal stenosis, lumbar region with neurogenic claudication: Principal | ICD-10-CM | POA: Diagnosis present

## 2021-11-04 DIAGNOSIS — M5136 Other intervertebral disc degeneration, lumbar region: Secondary | ICD-10-CM

## 2021-11-04 DIAGNOSIS — R2689 Other abnormalities of gait and mobility: Secondary | ICD-10-CM

## 2021-11-04 DIAGNOSIS — Z20822 Contact with and (suspected) exposure to covid-19: Secondary | ICD-10-CM | POA: Diagnosis present

## 2021-11-04 DIAGNOSIS — M4316 Spondylolisthesis, lumbar region: Secondary | ICD-10-CM

## 2021-11-04 DIAGNOSIS — Z880 Allergy status to penicillin: Secondary | ICD-10-CM

## 2021-11-04 DIAGNOSIS — M5416 Radiculopathy, lumbar region: Secondary | ICD-10-CM | POA: Diagnosis present

## 2021-11-04 DIAGNOSIS — Z905 Acquired absence of kidney: Secondary | ICD-10-CM

## 2021-11-04 DIAGNOSIS — Z789 Other specified health status: Secondary | ICD-10-CM

## 2021-11-04 DIAGNOSIS — Z01818 Encounter for other preprocedural examination: Secondary | ICD-10-CM

## 2021-11-04 DIAGNOSIS — Z882 Allergy status to sulfonamides status: Secondary | ICD-10-CM

## 2021-11-04 DIAGNOSIS — M109 Gout, unspecified: Secondary | ICD-10-CM | POA: Diagnosis present

## 2021-11-04 DIAGNOSIS — R52 Pain, unspecified: Secondary | ICD-10-CM

## 2021-11-04 DIAGNOSIS — Z981 Arthrodesis status: Secondary | ICD-10-CM

## 2021-11-04 DIAGNOSIS — E669 Obesity, unspecified: Secondary | ICD-10-CM | POA: Diagnosis present

## 2021-11-04 DIAGNOSIS — G8929 Other chronic pain: Secondary | ICD-10-CM | POA: Diagnosis present

## 2021-11-04 DIAGNOSIS — M4317 Spondylolisthesis, lumbosacral region: Secondary | ICD-10-CM | POA: Diagnosis present

## 2021-11-04 DIAGNOSIS — G834 Cauda equina syndrome: Secondary | ICD-10-CM | POA: Diagnosis present

## 2021-11-04 DIAGNOSIS — F329 Major depressive disorder, single episode, unspecified: Secondary | ICD-10-CM | POA: Diagnosis present

## 2021-11-04 LAB — COMPREHENSIVE METABOLIC PANEL, BLOOD
ALT (SGPT): 19 U/L (ref 0–41)
AST (SGOT): 21 U/L (ref 0–40)
Albumin: 4.6 g/dL (ref 3.5–5.2)
Alkaline Phos: 74 U/L (ref 40–129)
Anion Gap: 15 mmol/L (ref 7–15)
BUN: 38 mg/dL — ABNORMAL HIGH (ref 8–23)
Bicarbonate: 26 mmol/L (ref 22–29)
Bilirubin, Tot: 1.42 mg/dL — ABNORMAL HIGH (ref <1.2)
Calcium: 9.6 mg/dL (ref 8.5–10.6)
Chloride: 99 mmol/L (ref 98–107)
Creatinine: 0.94 mg/dL (ref 0.67–1.17)
Glucose: 170 mg/dL — ABNORMAL HIGH (ref 70–99)
Potassium: 4.4 mmol/L (ref 3.5–5.1)
Sodium: 140 mmol/L (ref 136–145)
Total Protein: 7.1 g/dL (ref 6.0–8.0)
eGFR Based on CKD-EPI 2021 Equation: >60 mL/min

## 2021-11-04 LAB — CBC WITH DIFF, BLOOD
ANC-Manual Mode: 12.7 10*3/uL — ABNORMAL HIGH (ref 1.6–7.0)
Abs Basophils: 0 10*3/uL (ref <0.2)
Abs Eosinophils: 0 10*3/uL (ref 0.0–0.5)
Abs Lymphs: 1 10*3/uL (ref 0.8–3.1)
Abs Monos: 0.9 10*3/uL — ABNORMAL HIGH (ref 0.2–0.8)
Basophils: 0 %
Eosinophils: 0 %
Hct: 45.3 % (ref 40.0–50.0)
Hgb: 15.9 gm/dL (ref 13.7–17.5)
Lymphocytes: 7 %
MCH: 30.8 pg (ref 26.0–32.0)
MCHC: 35.1 g/dL (ref 32.0–36.0)
MCV: 87.6 um3 (ref 79.0–95.0)
MPV: 10.7 fL (ref 9.4–12.4)
Monocytes: 6 %
Plt Count: 223 10*3/uL (ref 140–370)
RBC: 5.17 10*6/uL (ref 4.60–6.10)
RDW: 13.4 % (ref 12.0–14.0)
Segs: 87 %
WBC: 14.6 10*3/uL — ABNORMAL HIGH (ref 4.0–10.0)

## 2021-11-04 LAB — MDIFF
Number of Cells Counted: 115
Plt Est: ADEQUATE

## 2021-11-04 LAB — URINALYSIS WITH CULTURE REFLEX, WHEN INDICATED
Bilirubin: NEGATIVE
Blood: NEGATIVE
Glucose: NEGATIVE
Ketones: NEGATIVE
Leuk Esterase: 25 Leu/uL — AB
Nitrite: NEGATIVE
Specific Gravity: 1.02 (ref 1.002–1.030)
Urobilinogen: NEGATIVE
pH: 5.5 (ref 5.0–8.0)

## 2021-11-04 LAB — PROTHROMBIN TIME, BLOOD
INR: 1
PT,Patient: 10.9 s (ref 9.7–12.5)

## 2021-11-04 LAB — INFLUENZA A/B & SARS-COV-2 PCR COMBO FOR RAPID RESPONSE LAB
Influenza A PCR, RRL: NOT DETECTED
Influenza B PCR, RRL: NOT DETECTED
SARS-CoV-2 PCR, RRL: NOT DETECTED

## 2021-11-04 LAB — APTT, BLOOD: PTT: 24 s — ABNORMAL LOW (ref 27–36)

## 2021-11-04 MED ORDER — DULOXETINE HCL 20 MG OR CPEP
20.0000 mg | ORAL_CAPSULE | Freq: Every day | ORAL | Status: DC
Start: 2021-11-04 — End: 2021-11-12
  Administered 2021-11-05 – 2021-11-12 (×9): 20 mg via ORAL
  Filled 2021-11-04 (×8): qty 1

## 2021-11-04 MED ORDER — AMLODIPINE 10 MG OR TABS
10.0000 mg | ORAL_TABLET | Freq: Every day | ORAL | Status: DC
Start: 2021-11-04 — End: 2021-11-12
  Administered 2021-11-05 – 2021-11-12 (×9): 10 mg via ORAL
  Filled 2021-11-04 (×9): qty 1

## 2021-11-04 MED ORDER — ALLOPURINOL 300 MG OR TABS
300.0000 mg | ORAL_TABLET | Freq: Every day | ORAL | Status: DC
Start: 2021-11-04 — End: 2021-11-12
  Administered 2021-11-05 – 2021-11-12 (×9): 300 mg via ORAL
  Filled 2021-11-04: qty 3
  Filled 2021-11-04: qty 1
  Filled 2021-11-04 (×2): qty 3
  Filled 2021-11-04: qty 1
  Filled 2021-11-04 (×2): qty 3
  Filled 2021-11-04 (×2): qty 1

## 2021-11-04 MED ORDER — ACETAMINOPHEN 325 MG PO TABS
975.0000 mg | ORAL_TABLET | Freq: Three times a day (TID) | ORAL | Status: DC
Start: 2021-11-04 — End: 2021-11-12
  Administered 2021-11-04 – 2021-11-12 (×20): 975 mg via ORAL
  Filled 2021-11-04 (×19): qty 3

## 2021-11-04 MED ORDER — OXYCODONE HCL 10 MG OR TABS
10.0000 mg | ORAL_TABLET | ORAL | Status: DC | PRN
Start: 2021-11-04 — End: 2021-11-06
  Administered 2021-11-04 – 2021-11-06 (×5): 10 mg via ORAL
  Filled 2021-11-04 (×4): qty 1

## 2021-11-04 MED ORDER — HYDROMORPHONE HCL 1 MG/ML IJ SOLN
1.0000 mg | Freq: Once | INTRAMUSCULAR | Status: AC
Start: 2021-11-04 — End: 2021-11-04
  Administered 2021-11-04 (×2): 1 mg via INTRAVENOUS
  Filled 2021-11-04: qty 1

## 2021-11-04 MED ORDER — KETOROLAC TROMETHAMINE 15 MG/ML IJ SOLN
15.0000 mg | Freq: Once | INTRAMUSCULAR | Status: AC
Start: 2021-11-04 — End: 2021-11-04
  Administered 2021-11-04 (×2): 15 mg via INTRAVENOUS
  Filled 2021-11-04: qty 1

## 2021-11-04 MED ORDER — PREGABALIN 100 MG OR CAPS
100.0000 mg | ORAL_CAPSULE | Freq: Every evening | ORAL | Status: DC
Start: 2021-11-04 — End: 2021-11-12
  Administered 2021-11-04 – 2021-11-11 (×8): 100 mg via ORAL
  Filled 2021-11-04 (×7): qty 1

## 2021-11-04 MED ORDER — LIDOCAINE 4 % EX PTCH
1.0000 | MEDICATED_PATCH | CUTANEOUS | Status: DC
Start: 2021-11-04 — End: 2021-11-12
  Administered 2021-11-04 – 2021-11-12 (×7): 1 via TRANSDERMAL
  Filled 2021-11-04 (×8): qty 1

## 2021-11-04 MED ORDER — OXYCODONE HCL 5 MG OR TABS
5.0000 mg | ORAL_TABLET | ORAL | Status: DC | PRN
Start: 2021-11-04 — End: 2021-11-06

## 2021-11-04 MED ORDER — ONDANSETRON HCL 4 MG/2ML IV SOLN
4.0000 mg | Freq: Once | INTRAMUSCULAR | Status: AC
Start: 2021-11-04 — End: 2021-11-04
  Administered 2021-11-04 (×2): 4 mg via INTRAVENOUS
  Filled 2021-11-04: qty 2

## 2021-11-04 MED ORDER — SENNA 8.6 MG OR TABS
8.6000 mg | ORAL_TABLET | Freq: Every day | ORAL | Status: DC
Start: 2021-11-04 — End: 2021-11-09
  Administered 2021-11-04 – 2021-11-09 (×7): 8.6 mg via ORAL
  Filled 2021-11-04 (×6): qty 1

## 2021-11-04 NOTE — Telephone Encounter (Signed)
[facility] 10/25/21 Dr Earnest Conroy no notes  ED 10/29/21: MRI:    Postsurgical changes from prior L3 with adjacent level disease at L2-L3-S1 spinal fusion, in conjunction with right paracentral disc extrusion and retrolisthesis, resulting in severe compression of the cauda equina    Spoke to pt and he is in severe pain to L/S and bilateral legs.He is 'barely" able to walk at home. He continues with foley.He was seen in ED this week and surgery was cancelled.     He will go back to the ED for assessment now. Will send ED referral and notify surgeon.

## 2021-11-04 NOTE — ED Notes (Signed)
Genie RN at bedside evaluating patient

## 2021-11-04 NOTE — ED Geriatric Nursing Note (Signed)
Chief Complaint   Stephen [name] is a 70 year old year old male that presented to the Emergency Department today with the chief complaint of: Leg Pain (BIBM from home, has Right leg pain that stems from pre-existing chronic nerve/back pain. Had a recent MRI and CT done last Thursday. Was discharge home with pain control, came back due to unrelieved pain.)     Pertinent Medical History   Past Medical History:   Diagnosis Date    Chronic back pain     Congenital hydronephrosis     Gout     Headache     Hematuria     Kidney disease     Kidney stones     Major depressive disorder, single episode     Polyarthropathy or polyarthritis of multiple sites     Retinal detachment     Urethral stricture       Emergency Contact   Extended Emergency Contact Information  Primary Emergency Contact: Latina Craver [place] Phone: [phone]  Mobile Phone: [phone]  Relation: SISTER  Other needs: None  Preferred language: English  Interpreter needed? No   PCP   [name] - [phone]   Consent   Lovena Le gave verbal consent to comprehensive geriatric assessment. The Emergency Department Attending Provider is aware of geriatric screening.       Their ISAR (Identify Seniors At Risk) score today was ISAR Score (Scores 2 or higher indicate High Risk): 3.    A score of 2 or > means if they are discharged home, it could result in a poor outcome.       4Ms   The 4Ms framework guides the geriatric nursing assessment: Mobility, Mentation, Medications, what Matters.     Home Environment    Who do you live with? Family Member   Type of Residence: Private Home   Are there stairs in the home? No   Suspicion of Abuse    Elder Abuse Suspicion Index (EASI) NEGATIVE  A positive EASI should be reported to APS and/or Education officer, museum.     4Ms: Mobility    Do you use any DME:     Yes; Cane and Walker   Have you had any falls in the past 3 months?   No   GUG Score= GUG Score: 2 - Very slightly  abnormal  Consider a Physical Therapy Consult for a patient with a GUG Score of 3 or higher.   Physical Therapy Referral? No   Pt provided with a walker or cane? No   KATZ ADLs Activities of Daily Living   Bathing Bathing: 1 - Independence   Dressing Dressing: 1 - Independence   Toileting Toileting: 1 - Independence   Transferring Transferring: 1 - Independence   Continence Continence: 1 - Independence   Feeding Feeding: 1 - Independence   KATZ Score KATZ Total (Score of 3 or less is Abnormal): 6           IADL's Instrumental Activities of Daily Living   Ability to Use Telephone:   Independent   Shopping:   Independent   Food Preparation:   Independent   Housekeeping:   Independent   Laundry:   Independent   Mode of Transportation:   Independent   Responsible for Own Medications:   Independent   Ability to Handle Finances:   Independent     Caregiver Strain    Caregiver Strain Score     Social Work  referral? No   Caregiver Gender No Caregiver   4Ms: Medications    BEERS List Medication(s)     Pharmacy Referral Yes   4Ms: Mentation    Dementia: AMT4 Score  Abbreviated Mental Test 4 AMT4 Score: 4  A score of <4 suggests possible cognitive impairment   Alzheimer's: BAS  Brief Alzheimer's Screen BAS Total Score: 37  A score of <23 suggests possible cognitive impairment   Delirium: CAM-ICU  Confusion Assessment Method for the Intensive Care Unit NEGATIVE   Depression: PHQ2 & PHQ9  Patient Health Questionnaire PHQ2 Total: 0  A score of 3+ suggests possible depressive disorder.     Total Score               Depression Severity        1-4                       Minimal depression        5-9                       Mild depression       10-14                    Moderate depression       15-19                    Mod/severe depress       20-27                    Severe depression   Referral None   Nutrition    Last six (6) documented weights Date Weight Recorded 10/29/2021 10/26/2021 10/26/2021 09/15/2021 09/09/2021 09/02/2021    Metric 91.2 kg 91.627 kg 91.627 kg 89.359 kg 89.721 kg 90.311 kg   Pounds/Ounces 201 lb 1 oz 202 lb 202 lb 197 lb 197 lb 12.8 oz 199 lb 1.6 oz      MNA Score  Mini Nutritional Assessment MNA Score (8 or less is abnormal): 14  Consider a Nutrition Consult when MNA Score < 11   Referral None   4Ms: What Matters    What Matters  Example: What matters the most to you? What does a good day look like for you? What health goal is most important to you? Pt came to the ER with the C/O RT leg pain D/T his chronic back pain.  Hx of Cauda Equina Compression and admission for the same problem on 10/29/21.  Denies a need for any home health.     Palliative performance scale : 70%   Disposition Admit   GENIE Referrals    Referrals today Pharmacy and [facility]   PCP Updated? No               GENIE Communication   See notes         Screened by   Geriatric Emergency Nurse Initiative Expert (GENIE)  Jamelle Rushing, RN  [facility]  11/04/2021 1:44 PM

## 2021-11-04 NOTE — ED MD Progress Note (Signed)
ED Attending Documentation    Briefly, the patient is a 70 year old male presenting with back and leg pain.  Patient was recently hospitalized at this facility from 10/29/2021 through 11/02/2021 for management of cauda equina syndrome.  Patient was to undergo decompressive surgery but this had to be pushed back and patient was instead discharged home with plan for outpatient surgery.  Since returning home, patient reports increasing pain across the lower portion of his back wrapping around into the right thigh.  States that the discomfort has become unbearable and as result he return to ED for re-evaluation.  He denies any new numbness or weakness within his lower extremities.  Does have difficulty standing and walking because of the pain.  He denies any saddle anesthesia.  Foley catheter was placed during hospitalization and has been outputting urine appropriately with no penile or GU symptoms.  He otherwise denies any fever, chest pain, dyspnea, or palpitations.  Does have constipation from home analgesics.  Denies any recent traumatic injury.    Physical examination demonstrates alert and interactive individual.  Cardiopulmonary auscultation without significant abnormality.  Abdominal exam with minimal distension but otherwise no significant abnormalities.  Patient does have some mild paraspinal tenderness on the right lower lumbar spine.  No current midline spinal tenderness or step-off.  Patient does also report pain within the right anteromedial thigh though there is no discernible tenderness or soft tissue abnormality in this region.  Patient appears to have full strength and sensation in lower extremities.  No saddle anesthesia.  Overall hemodynamically stable with no evidence of respiratory distress or fever.    Suspect symptoms likely due to previously diagnosed cauda equina syndrome.  Thankfully, no evidence of progression in terms of new neurologic compromise or deficits as compared to prior  hospitalization.  Will obtain screening laboratory studies.  Will discuss patient with ortho spine.  Further management pending workup but anticipate admission.    Please refer to separate resident/APP ED provider note from today for additional information on history, findings, and plan of care.

## 2021-11-04 NOTE — ED Notes (Signed)
Geriatric Emergency Medicine Pharmacist Consult  The following medication list has been reviewed for BEERS criteria and geriatric considerations (including, but not limited to, drug-drug, drug-disease, and drug-age interactions).  The below finding reflect medications that should typically be avoided in most older patients, medications that should be avoided in older patients with certain conditions, medications that should be used with caution because of benefits that may offset risks, medication interactions, and changes in dosing based on kidney function.  However, these recommendations reflect potentially inappropriate medications that may clinically benefit the patient and in no means are meant to be a substitution for clinical decisions made by the provider.    No current facility-administered medications on file prior to encounter.     Current Outpatient Medications on File Prior to Encounter   Medication Sig Dispense Refill    acetaminophen (TYLENOL) 325 MG tablet Take 2 tablets (650 mg) by mouth every 4 hours as needed for Mild Pain (Pain Score 1-3). 30 tablet 0    ALAWAY 0.025 % ophthalmic solution INSTILL 1 DROP INTO BOTH EYES TWICE A DAY AS DIRECTED      allopurinol (ZYLOPRIM) 300 MG tablet Take 300 mg by mouth daily.      amLODIPINE (NORVASC) 5 MG tablet Take 10 mg by mouth daily.      ascorbic acid (VITAMIN C) 500 MG/5ML suspension       DULoxetine (CYMBALTA) 20 MG CR capsule Take 1 capsule (20 mg) by mouth daily. 90 capsule 3    [DISCONTINUED] gabapentin (NEURONTIN) 100 MG capsule Take 1 capsule (100 mg) by mouth 3 times daily for 7 days. Start taking after your procedure is completed. 21 capsule 0    lidocaine (ASPERCREME) 4 % patch Apply 1 patch topically every 24 hours. Leave patch on for 12 hours, then remove for 12 hours. 15 patch 0    LORazepam (ATIVAN) 1 MG tablet Take 1 tablet (1 mg) by mouth every 6 hours as needed for Anxiety. 2 tablet 0    methylPREDNISolone (MEDROL DOSEPACK) 4 MG  tablet Take as directed on package 21 each 0    naloxone (NARCAN) 4 mg/0.1 mL nasal spray For suspected opioid overdose, call 911! Then spray once in one nostril. Repeat after 3 minutes if no or minimal response using a new spray in other nostril. 2 each 0    oxyCODONE (ROXICODONE) 5 MG immediate release tablet Take 1 to 2 tablets by mouth every 6 hours as needed for Moderate Pain (Pain Score 4-6) or Severe Pain (Pain Score 7-10) 45 tablet 0    Polyethyl Glycol-Propyl Glycol (SYSTANE ULTRA OP)       pregabalin (LYRICA) 100 MG capsule TAKE 1 CAPSULE BY MOUTH EVERY NIGHT AT BEDTIME 30 capsule 0    [DISCONTINUED] pregabalin (LYRICA) 100 MG capsule TAKE 1 CAPSULE BY MOUTH EVERYDAY AT BEDTIME 30 capsule 0    [DISCONTINUED] pregabalin (LYRICA) 100 MG capsule Take 1 capsule (100 mg) by mouth at bedtime. 30 capsule 0    [DISCONTINUED] pregabalin (LYRICA) 75 MG capsule Take 1 capsule (75 mg) by mouth daily. 30 capsule 0    [DISCONTINUED] pregabalin (LYRICA) 75 MG capsule Take 1 capsule (75 mg) by mouth daily. 30 capsule 0    senna (SENOKOT) 8.6 MG tablet Take 1 tablet (8.6 mg) by mouth daily. 30 tablet 0    triamcinolone (KENALOG) 0.1 % cream Apply 1 Application topically 2 times daily. Apply a thin layer as directed      VITAMIN D PO Take 125  mg by mouth daily.      zolpidem (AMBIEN) 5 MG tablet Take 1 tablet (5 mg) by mouth nightly as needed for Insomnia. 30 tablet 0       Pharmacist findings and recommendations:    Lorazepam, Zolpidem  - Benzodiazepines and "Z" hypnotics increase the risk of cognitive impairment, delirium, falls, and fractures; potency and duration are increased in elderly patients.  Tolerance, psychological and physical dependence may occur with prolonged use (beyond 2 to 4 weeks).  These agents should be avoided whenever possible.  If prescribed, evaluate the need for continued treatment prior to extending therapy duration.  - Recommended alternatives for insomnia: Nonpharmacologic  approaches should be utilized first. If nonpharmacologic approaches are ineffective, pharmacologic options include: ramelteon, mirtazapine, trazodone, gabapentin (if concomitant neuropathic pain or restless leg syndrome, with dose adjusted for renal function).  - Recommended alternatives for anxiety: SSRI (except paroxetine and fluoxetine), SNRI, mirtazapine, buspirone, gabapentin (with dose adjusted for renal function).    Oxycodone  - Although not included on the BEERS list individually, opioids increase the risk of sedation, falls, and respiratory depression.  Risks must be weighed against benefits of therapy in selected individuals.  - Recommend continuing therapy only if benefits outweigh risks; using the lowest dose necessary to relieve severe pain; and encouraging use of non-opioid analgesics (acetaminophen).    Duloxetine + Lorazepam + Oxycodone + Pregabalin + Zolpidem  - The combination of opioids and benzodiazepines increases the risk of overdose.  - The combination of opioids with gabapentin or pregabalin increases the risk of severe sedation-related adverse events, including respiratory depression.  - The combination of any three or more CNS-active agents (including antidepressants, anti-seizure medications, opioids, benzodiazepines, and antipsychotics) increases the risk of falls and fractures.  - It is recommended to avoid these combinations when possible, or to use the lowest dose and duration necessary when use is unavoidable.      Radene Gunning, PharmD, BCPS  Staff Pharmacist  [place]      References:    [place] Abbreviated Beers Criteria for Potentially Inappropriate Medication Use in Older Adults:  https://pulse.ComputerFly.si.pdf    American Geriatrics Society 2019 Updated AGS Beers Criteria for Potentially Inappropriate Medication Use in Older Adults:  LimitBuy.nl.97847

## 2021-11-04 NOTE — ED Notes (Signed)
Bed: 1AMB  Expected date:   Expected time:   Means of arrival:   Comments:  44 M, leg pain

## 2021-11-04 NOTE — ED Provider Notes (Signed)
Emergency Department Note  [name] electronic medical record reviewed for pertinent medical history.     Nursing Triage Note:   Chief Complaint   Patient presents with    Leg Pain     BIBM from home, has Right leg pain that stems from pre-existing chronic nerve/back pain. Had a recent MRI and CT done last Thursday. Was discharge home with pain control, came back due to unrelieved pain.       HPI:   70 year old male with a PMH significant for lumbar spine disc protrusion, recent ED visit on 10/29/2021 with diagnosis of severe cauda equina, presented emergency department with for back pain.  Patient endorses pain in the legs bilaterally as well, primarily on the right.  Patient states that there is an area of the right anterior thigh that seems to have decreased sensation however patient endorses normal sensation of the distal lower extremities.  Patient has a Foley in place that was placed during his previous presentation to the hospital or urinary retention.  Patient denies bowel incontinence, fever, new weakness or numbness, acute worsening of pain.    HPI    Past Medical History:   Diagnosis Date    Chronic back pain     Congenital hydronephrosis     Gout     Headache     Hematuria     Kidney disease     Kidney stones     Major depressive disorder, single episode     Polyarthropathy or polyarthritis of multiple sites     Retinal detachment     Urethral stricture        Past Surgical History:   Procedure Laterality Date    CT INSERTION OF SUPRAPUBIC CATH  09/25/2015    NEPHRECTOMY Right 1995    APPENDECTOMY      COLONOSCOPY      CYSTOSCOPY      CYSTOSCOPY W/ LASER LITHOTRIPSY      OTHER SURGICAL HISTORY      Interstim 01/29/2011    SPINE SURGERY  09/21    Lumbar-sacral fusion    TRANSURETHRAL RESECTION OF PROSTATE         Family History:  For past Medical/Surgical/Social/Family History, refer to HPI, unless explicitly noted here.    Family History   Adopted: Yes   Family history unknown: Yes        Social History [facility]):    Medications:   Prior to Admission Medications   Prescriptions Last Dose Informant Patient Reported? Taking?   ALAWAY 0.025 % ophthalmic solution   Yes No   Sig: INSTILL 1 DROP INTO BOTH EYES TWICE A DAY AS DIRECTED   DULoxetine (CYMBALTA) 20 MG CR capsule   No No   Sig: Take 1 capsule (20 mg) by mouth daily.   LORazepam (ATIVAN) 1 MG tablet   No No   Sig: Take 1 tablet (1 mg) by mouth every 6 hours as needed for Anxiety.   Polyethyl Glycol-Propyl Glycol (SYSTANE ULTRA OP)   Yes No   VITAMIN D PO   Yes No   Sig: Take 125 mg by mouth daily.   acetaminophen (TYLENOL) 325 MG tablet   No No   Sig: Take 2 tablets (650 mg) by mouth every 4 hours as needed for Mild Pain (Pain Score 1-3).   allopurinol (ZYLOPRIM) 300 MG tablet   Yes No   Sig: Take 300 mg by mouth daily.   amLODIPINE (NORVASC) 5 MG tablet   Yes No  Sig: Take 10 mg by mouth daily.   ascorbic acid (VITAMIN C) 500 MG/5ML suspension   Yes No   lidocaine (ASPERCREME) 4 % patch   No No   Sig: Apply 1 patch topically every 24 hours. Leave patch on for 12 hours, then remove for 12 hours.   methylPREDNISolone (MEDROL DOSEPACK) 4 MG tablet   No No   Sig: Take as directed on package   naloxone (NARCAN) 4 mg/0.1 mL nasal spray   No No   Sig: For suspected opioid overdose, call 911! Then spray once in one nostril. Repeat after 3 minutes if no or minimal response using a new spray in other nostril.   oxyCODONE (ROXICODONE) 5 MG immediate release tablet   No No   Sig: Take 1 to 2 tablets by mouth every 6 hours as needed for Moderate Pain (Pain Score 4-6) or Severe Pain (Pain Score 7-10)   pregabalin (LYRICA) 100 MG capsule   No No   Sig: TAKE 1 CAPSULE BY MOUTH EVERY NIGHT AT BEDTIME   senna (SENOKOT) 8.6 MG tablet   No No   Sig: Take 1 tablet (8.6 mg) by mouth daily.   triamcinolone (KENALOG) 0.1 % cream   Yes No   Sig: Apply 1 Application topically 2 times daily. Apply a thin layer as directed   zolpidem (AMBIEN) 5 MG tablet   No No   Sig:  Take 1 tablet (5 mg) by mouth nightly as needed for Insomnia.      Facility-Administered Medications: None       Allergies: Amoxicillin and Sulfa drugs    Review of Systems:   Review of Systems   Constitutional: Negative for fever.   Gastrointestinal: Negative for abdominal pain.   Neurological: Negative for weakness and numbness.   All other systems reviewed and are negative.    All other systems reviewed and negative unless otherwise noted in the HPI or above. This was done per my custom and practice for systems appropriate to the chief complaint in an emergency department setting and varies depending on the quality of history that the patient is able to provide.      Physical Exam:   11/04/21  1111   BP: 148/84   Pulse: 83   Resp: 19   Temp: 98.4 F (36.9 C)   SpO2: 98%     Orthostatic Vitals & Visual Acuity (all recorded)     ED Orthostatic Vitals     Row Name 11/04/21 1116       Visual Acuity    Visual Acuity Assess/Not Assessed Not Assessed    Row Name 11/04/21 11:11:07       Orthostatic Vital Signs    Resp 19    SpO2 98 %            Nursing note and vitals reviewed.     Physical Exam  Vitals and nursing note reviewed.   Constitutional:       General: He is not in acute distress.     Appearance: Normal appearance. He is not ill-appearing, toxic-appearing or diaphoretic.   HENT:      Head: Normocephalic.   Eyes:      Conjunctiva/sclera: Conjunctivae normal.   Cardiovascular:      Rate and Rhythm: Normal rate.   Abdominal:      General: Abdomen is flat. There is no distension.      Palpations: Abdomen is soft.      Tenderness: There is no abdominal tenderness.  There is no guarding.   Musculoskeletal:      Cervical back: No rigidity or tenderness.      Right lower leg: No edema.      Left lower leg: No edema.   Skin:     General: Skin is warm and dry.   Neurological:      Mental Status: He is alert.      Sensory: No sensory deficit (Sensation intact in the L4, L5, S1 nerve distributions of the feet bilaterally).       Motor: No weakness (Dorsiflexion, plantar flexion 5/5 bilaterally).   Psychiatric:         Mood and Affect: Mood normal.         Behavior: Behavior normal.         Workup Review:  ED Course as of 11/04/21 1220   Dorthula Rue Documentation   Thu Nov 04, 2021   1212 Ortho will see the patient    1205 Paged ortho      Labs Reviewed   URINALYSIS WITH CULTURE REFLEX, WHEN INDICATED - Abnormal; Notable for the following components:       Result Value    Protein 1+ (*)     Leuk Esterase 25 (*)     All other components within normal limits   COMPREHENSIVE METABOLIC PANEL, BLOOD - Abnormal; Notable for the following components:    Glucose 170 (*)     BUN 38 (*)     Bilirubin, Tot 1.42 (*)     All other components within normal limits   CBC WITH DIFF, BLOOD - Abnormal; Notable for the following components:    WBC 14.6 (*)     ANC-Manual Mode 12.7 (*)     Abs Monos 0.9 (*)     All other components within normal limits   APTT, BLOOD - Abnormal; Notable for the following components:    PTT 24 (*)     All other components within normal limits   PROTHROMBIN TIME, BLOOD   MDIFF   INFLUENZA A/B & SARS-COV-2 PCR COMBO FOR RAPID RESPONSE LAB    Narrative:     Collect using a flocked swab in the COPAN UTM only.  Is the patient a [employer] employee?->No   URINE CULTURE   MRSA SURVEILLANCE CULTURE     X-Ray Chest Single View    (Results Pending)     Medications   allopurinol (ZYLOPRIM) tablet 300 mg ( Oral Not given 11/04/21 1529)   amLODIPINE (NORVASC) tablet 10 mg (10 mg Oral Not given 11/04/21 1500)   DULoxetine (CYMBALTA) CR capsule 20 mg (20 mg Oral Not given 11/04/21 1500)   lidocaine (ASPERCREME) 4 % patch 1 patch (1 patch Transdermal Patch Applied 11/04/21 1531)   pregabalin (LYRICA) capsule 100 mg (has no administration in time range)   senna (SENOKOT) tablet 8.6 mg (8.6 mg Oral Given 11/04/21 1529)   acetaminophen (TYLENOL) tablet 975 mg (975 mg Oral Given 11/04/21 1527)   oxyCODONE (ROXICODONE) tablet 5 mg  ( Oral See Alternative 11/04/21 1530)     Or   oxyCODONE (ROXICODONE) tablet 10 mg (10 mg Oral Given 11/04/21 1530)   oxyCODONE (ROXICODONE) tablet 5 mg (has no administration in time range)   ketOROLAC (TORADOL) injection 15 mg (15 mg IntraVENOUS Given 11/04/21 1206)   HYDROmorphone (DILAUDID) injection 1 mg (1 mg IntraVENOUS Given 11/04/21 1314)   ondansetron (ZOFRAN) injection 4 mg (4 mg IntraVENOUS Given 11/04/21 1314)  ED EKG Documentation:     ED EKG Note      ED EKG Interpretation by You, Elberta Leatherwood, MD at 11/04/2021  1:54 PM    Version 1 of 1    Author: Dennis Bast, Elberta Leatherwood, MD Service: -- Author Type: Attending Physician      Filed: 11/04/2021  1:54 PM Date of Service: 11/04/2021  1:54 PM Status:   Signed    Editor: You, Elberta Leatherwood, MD (Attending Physician)       ED EKG Interpretation    EKG: Normal Sinus Rhythm with Normal Axis and nonspecific ST and T wave   changes.           ED Orders:  Orders Placed This Encounter   Procedures    X-Ray Chest Single View    Urinalysis with Culture Reflex, when indicated    Hemogram Lavender    Basic Metabolic Panel, Blood Green Plasma Separator Tube    CMP    CBC w/ Diff Lavender    aPTT, Blood Blue    Prothrombin Time, Blood Blue    MDIFF    IP Consult to Orthopedics, Spine    ECG 12 Lead       Impression & ED Plan:  70 year old  male presents with severe back pain radiating to the legs.  Unable to obtain PVR as patient has a Foley catheter in place however patient denies bowel or bladder incontinence.  Per review of medical record, patient had an MRI on 10/29/2021 that is needed that there was severe cauda equina compression.  Orthopedic surgery spine consulted who saw the patient.  Patient admitted to Ortho Spine.    I have discussed my evaluation and care plan for the patient with the attending physician Dr. Dennis Bast.        Judy Pimple, MD  Resident  11/04/21 1556       You, Elberta Leatherwood, MD  11/04/21 [place]

## 2021-11-04 NOTE — Telephone Encounter (Signed)
The MRI he got is sufficient

## 2021-11-04 NOTE — Telephone Encounter (Signed)
Patient is calling wanting information on when he can schedule his surgery, he is requesting  a call back .please follow up with pt     Best call back number: [phone]

## 2021-11-04 NOTE — Goals of Care (Signed)
Pt alert and oriented. VSS. Afebrile. Pain managed w/ prn medication w/ some relief. Urine bag intact and patent. Education made about cares.

## 2021-11-04 NOTE — ED Notes (Signed)
Given report to [facility]

## 2021-11-04 NOTE — Telephone Encounter (Signed)
RN called and left a voicemail for pt on the phone about provider recommendations:     Per provider the MRI from 10/29/21 and pt does not need the MRI on 11/06/21     RN advised that pt call back with any questions at [phone]     Encounter closed

## 2021-11-04 NOTE — ED Notes (Signed)
Patient has a leg foley catheter. No need for PVR per dr. Lynder Parents

## 2021-11-04 NOTE — ED Floor Report (Signed)
ED to [place] created by Deandre Stansel Rogers Seeds, RN at 2:39 PM 11/04/2021.     HANDOFF REPORT UPDATE/CHANGES (changes in patient status/care/events prior to transfer)  By who:  Time:   Additional information:                                                                                                                                                     Stephen [name] is a 70 year old male.    Brief Summary of ED Visit (to include focused assessment and neuro status):    70 year old male with a PMH significant for lumbar spine disc protrusion, recent ED visit on 10/29/2021 with diagnosis of severe cauda equina, presented emergency department with for back pain.  Patient endorses pain in the legs bilaterally as well, primarily on the right.  Patient states that there is an area of the right anterior thigh that seems to have decreased sensation however patient endorses normal sensation of the distal lower extremities.  Patient has a Foley in place that was placed during his previous presentation to the hospital or urinary retention.  Patient denies bowel incontinence, fever, new weakness or numbness, acute worsening of pain.     RN shift assessment exceptions to WDL:     Any significant events and interventions with responses:      Radiologic studies not completed:   (None unless otherwise noted)    Chief Complaint   Patient presents with    Leg Pain     BIBM from home, has Right leg pain that stems from pre-existing chronic nerve/back pain. Had a recent MRI and CT done last Thursday. Was discharge home with pain control, came back due to unrelieved pain.       Admitted for: cauda equia compression    Code Status:  Please refer to In-pt admitting doctors orders     Level of Care: medsurg     Sepsis Section:    Is patient septic? no If yes, complete below:    BC x 2 drawn? no  If No explain:      Repeat lactate needed? no  If Yes, when is it due?      Have TWO Blood Pressures (with MAPs) been  documented AFTER crystalloids completed?  no     All initial antibiotics given?  no  If No, explain:      Amount of IV fluids received 0 mL  _________________________________________________________________    Is patient on Heparin? no If yes, complete below:     Time Heparin bolus was given:     Additional drips patient is on:     Cardiac rhythm:     Oxygen Delivery: None    Past Medical History:   Diagnosis Date    Chronic back pain  Congenital hydronephrosis     Gout     Headache     Hematuria     Kidney disease     Kidney stones     Major depressive disorder, single episode     Polyarthropathy or polyarthritis of multiple sites     Retinal detachment     Urethral stricture        Past Surgical History:   Procedure Laterality Date    CT INSERTION OF SUPRAPUBIC CATH  09/25/2015    NEPHRECTOMY Right 1995    APPENDECTOMY      COLONOSCOPY      CYSTOSCOPY      CYSTOSCOPY W/ LASER LITHOTRIPSY      OTHER SURGICAL HISTORY      Interstim 01/29/2011    SPINE SURGERY  09/21    Lumbar-sacral fusion    TRANSURETHRAL RESECTION OF PROSTATE         Allergies: Amoxicillin and Sulfa drugs    ED Fall Risk:      Skin issues:  no    >> If yes, note areas of skin breakdown. See appropriate photos.      Ambulatory: yes with a cane    Sitter needed: no    CSSRS Suicide Risk Level Screening in Triage: Minimal Risk    Does this patient have a history of aggressive behavior and/or is this patient a green banner patient? no    Isolation Required: no     >> If yes , what type of isolation:     Is patient in custody?  no    Is patient in restraints? no    Swallow Screen:      Swallow Screen Result:      Was the patient made NPO & a Speech Language Pathology Consult Ordered? no    Vitals:    11/04/21 1111 11/04/21 1437   BP: 148/84 135/83   BP Location: Left arm Left arm   BP Patient Position: Semi-Fowlers Semi-Fowlers   Pulse: 83 75   Resp: 19 16   Temp: 98.4 F (36.9 C) 98 F (36.7 C)   SpO2: 98% 99%            Lab Results   Component  Value Date    WBC 14.6 (H) 11/04/2021    RBC 5.17 11/04/2021    HGB 15.9 11/04/2021    HCT 45.3 11/04/2021    MCV 87.6 11/04/2021    MCHC 35.1 11/04/2021    RDW 13.4 11/04/2021    PLT 223 11/04/2021    MPV 10.7 11/04/2021       Lab Results   Component Value Date    NA 140 11/04/2021    K 4.4 11/04/2021    CL 99 11/04/2021    BICARB 26 11/04/2021    BUN 38 (H) 11/04/2021    CREAT 0.94 11/04/2021    GLU 170 (H) 11/04/2021    [place] 9.6 11/04/2021       No results found for: BNP, PHOS, MG, LACTATE, AMMONIA, IONCA, ARTIONCA    No results found for: CPK, CKMBH, TROPONIN    No results found for: PH, PCO2, O2CONTENT, IVHC3, IVBE, O2SAT, UNPH, UNPCO2, ARTPH, ARTPCO2, ARTO2CNT, IAHC3, IABE, ARTO2SAT, UNAPH, UNAPCO2    No results found for this visit on 11/04/21.      Patient Lines/Drains/Airways Status       Active PICC Line / CVC Line / PIV Line / Drain / Airway / Intraosseous Line / Epidural Line / ART Line /  Line Type / Wound / Pressure Ulcer Injury       Name Placement date Placement time Site Days    Peripheral IV - 20 G Left Antecubital 11/04/21  1315  Antecubital  less than 1    Indwelling Urinary Catheter -  10/29/21 RN 16 fr 10 ml Yes 10/29/21  2300  --  5                        ED Handoff Report is ready for review.  Admitting RN may reach Emergency Department RN, [place], RN, at [phone] with any questions.

## 2021-11-04 NOTE — ED Notes (Signed)
Dr. You at bedside evaluating patient

## 2021-11-04 NOTE — Telephone Encounter (Signed)
Stephen [name] 70 year old   ESTABLISHED PAT ONLY     Caller's Name, Relationship to patient: [name], [name]          Reason for call (including the symptoms/concerns patient has): patient is unable to walk, pt was admitted 10/29/2021 discharged  11/02/2021, patient is having severe pain from back to whole R leg. Patient is expressing excruciating pain.        Current Pain Level(0 Lowest -10 highest : 10/10          Provider Name: Zlomislic,Vinko        If  Patient had surgery: (Procedure Name) (DOS) N/A      LOV Date and Plan:  10/25/2021 unable to find plan          Best time to call patient and/or [place], and best contact ph# (RN please document outbound call): [phone] , anytime is ok to call back, ok to leave detailed message            Reminder:  Symptom calls should be responded to within 2 hours by RN. If you need a more urgent callback to patient, please use RN chat group and/or page. Use messaging guidelines for red-flag symptoms. Please verify that your patient has received callback with-in the requested timeframe.

## 2021-11-04 NOTE — ED Notes (Addendum)
Ortho/Spine MD's at bedside evaluating patient

## 2021-11-04 NOTE — EMS Narrative (Addendum)
Alert:  [id number]  [id number]  [id number]  [id number]  BLS-Basic /EMT  Incident Location Type: Y92.00  [address]  [id number]  [id number]    CC:  Extremity-Lower    Impression:  Primary Symptom: M79.6 - Pain in right arm  Provider's Primary Impression: G89.1 - Acute pain due to trauma  Lower Acuity Nyoka Cowden)    HPI:Pt Age: 70 Years; Gender: Male; Is there any reason to suspect patient   may HAVE or may have been EXPOSED to COVID-19: No; Provide specific details   for suspecting patient may HAVE or have been EXPOSED to COVID-19: ;Primary   Impression: Body Pain (non-traumatic);[id number] responded to private residence   for a 70 year old male standing in front yard awaiting EMTs arrival   complaining of right leg pain, non traumatic. Pt states that he has been   dealing with nerve issues in his back causing him to experience moderate to   severe pain in his right leg. Pt has been seen for this issue recently and   was discharged with prescriptions to help combat the pain. Pt was also   scheduled to have surgery soon however it was rescheduled for a later date.   The pain has not subsided with prescribed meds and pt states that he is   not able to wait for scheduled surgery which led him to activate 911 today   for tx to [facility] for further care and eval. Date/Time: 11/04/2021   10:02:11; Heart Assessment: Not Done; ABCs intact. A/Ox4 GCS 15. Pulse   rapid and regular at the radial site. Pt breathing 20 full and effective   99% on room air. Lungs clear. Eyes Perl, skins warm, pink and dry. Temp   99.1. Pt complains 10/10 leg pain, dull in nature, constant. Pt states that   the pain is related to a nerve issue his back. No outward signs of   bruising or trauma noted to right leg/back. Pain is preexisting. Denies any   new pain/injuries. Denies any recent falls. Pt MAEx4 PMS intact x 4. Pt   capable of ambulating with cane. Pt denies CP/SOB. Denies nausea/vomiting.   Stroke scale negative. Secondary assessment unremarkable. Pt  assisted to   gurney and placed in poc. All safety precautions taken. Vitals monitored.Pt   Transported to [employer] code 50 without any change in condition. Pt   assisted to hospital bed and turned over to rn.     Medical History: not recorded    Allergies to Medications: not recorded    Environmental and Food Allergies:    Current Medications:    Assessment:  Not Done    Cardiac Arrest:  No    EMS Airway:    EMS Medications:    EMS Procedures:  Date/Time Procedure Performed: 2022-11-17T10:02:11.000-08:00  Procedure Performed Prior to this Unit's EMS Care: No  Procedure: Pulse Oximetry Monitored  Procedure Complication: None  Response to Procedure: Unchanged    EMS Exam:    Patient Demographics:  Last Name: [name]  First Name: Stephen  Patient's Home Address: [place]: [id number]  [place]: [phone]  Patient's Home State: [place]  Patient's Home ZIP Code: [place] of Residence: [name]  Gender: Male  Race: White  Age: 33  Age Units: Years    Demographics Practitioner:    Advanced Directives:    Demographics Payment:  No Insurance Identified  Medicare  [id number]  Stephen [name]  Incident Times:  Unit Notified by Dispatch Date/Time: 2022-11-17T09:38:47.000-08:00  Unit En Route Date/Time: 2022-11-17T09:39:34.000-08:00  Unit Arrived on Scene Date/Time: 2022-11-17T09:56:24.000-08:00  Arrived at Patient Date/Time: 2022-11-17T09:58:00.000-08:00  Unit Left Scene Date/Time: 2022-11-17T09:59:56.000-08:00  Patient Arrived at Destination Date/Time: 2022-11-17T10:34:18.000-08:00  Destination Patient Transfer of Care Date/Time:   2022-11-17T11:07:00.000-08:00

## 2021-11-04 NOTE — ED EKG Interpretation (Signed)
ED EKG Interpretation    EKG: Normal Sinus Rhythm with Normal Axis and nonspecific ST and T wave changes.

## 2021-11-04 NOTE — Consults (Cosign Needed)
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     Patient was admitted on 11/11 to the orthopaedic spine service after patient presented to the ED w/ severe back pain and inability to walk. MRI at the time found severe spinal stenosis and he was admitted to the service for pain management and possible surgery. Patient was discharged on 11/15 for scheduling of outpatient surgery 2/2 lack of acute motor/sensory symptoms, controlled pain, and difficulties w/ inpatient scheduling.     Patient returns to the ED today for continued uncontrolled pain since his discharge. Denies change in severity of pain since onset approximately 2 weeks ago. Notes that it radiates from his back to the inside of his R thigh and knee and feels like a stabbing sensation. He has been on a multitude of pain medication prescribed by outpatient pain clinic that was not helpful in controlling his pain. Denies numbness, tingling in his LE or groin area.Marland Kitchen Has had a foley since prior admission due to chronic urinary retention with planned appointment with urology for a void trial tomorrow. Denies any bowel incontinence.     Review of Systems:  Negative other than described in HPI    PMH:  Past Medical History:   Diagnosis Date    Chronic back pain     Congenital hydronephrosis     Gout     Headache     Hematuria     Kidney disease     Kidney stones     Major depressive disorder, single episode      Polyarthropathy or polyarthritis of multiple sites     Retinal detachment     Urethral stricture         PSH:  Past Surgical History:   Procedure Laterality Date    CT INSERTION OF SUPRAPUBIC CATH  09/25/2015    NEPHRECTOMY Right 1995    APPENDECTOMY      COLONOSCOPY      CYSTOSCOPY      CYSTOSCOPY W/ LASER LITHOTRIPSY      OTHER SURGICAL HISTORY      Interstim 01/29/2011    SPINE SURGERY  09/21    Lumbar-sacral fusion    TRANSURETHRAL RESECTION OF PROSTATE          Medications:  No current facility-administered medications for this encounter.     Current Outpatient Medications   Medication Sig    acetaminophen (TYLENOL) 325 MG tablet Take 2 tablets (650 mg) by mouth every 4 hours as needed for Mild Pain (Pain Score 1-3).    ALAWAY 0.025 % ophthalmic solution INSTILL 1 DROP INTO BOTH EYES TWICE A DAY AS DIRECTED    allopurinol (ZYLOPRIM) 300 MG tablet Take 300 mg by mouth daily.    amLODIPINE (NORVASC) 5 MG tablet Take 10 mg by mouth daily.    ascorbic acid (VITAMIN C) 500 MG/5ML suspension     DULoxetine (CYMBALTA) 20 MG CR capsule Take 1 capsule (20 mg) by mouth  daily.    lidocaine (ASPERCREME) 4 % patch Apply 1 patch topically every 24 hours. Leave patch on for 12 hours, then remove for 12 hours.    LORazepam (ATIVAN) 1 MG tablet Take 1 tablet (1 mg) by mouth every 6 hours as needed for Anxiety.    methylPREDNISolone (MEDROL DOSEPACK) 4 MG tablet Take as directed on package    naloxone (NARCAN) 4 mg/0.1 mL nasal spray For suspected opioid overdose, call 911! Then spray once in one nostril. Repeat after 3 minutes if no or minimal response using a new spray in other nostril.    oxyCODONE (ROXICODONE) 5 MG immediate release tablet Take 1 to 2 tablets by mouth every 6 hours as needed for Moderate Pain (Pain Score 4-6) or Severe Pain (Pain Score 7-10)    Polyethyl Glycol-Propyl Glycol (SYSTANE ULTRA OP)     pregabalin (LYRICA) 100 MG capsule TAKE 1 CAPSULE BY MOUTH EVERY NIGHT AT BEDTIME     senna (SENOKOT) 8.6 MG tablet Take 1 tablet (8.6 mg) by mouth daily.    triamcinolone (KENALOG) 0.1 % cream Apply 1 Application topically 2 times daily. Apply a thin layer as directed    VITAMIN D PO Take 125 mg by mouth daily.    zolpidem (AMBIEN) 5 MG tablet Take 1 tablet (5 mg) by mouth nightly as needed for Insomnia.        Allergies:  Allergies   Allergen Reactions    Amoxicillin Rash    Sulfa Drugs Unspecified     Family History:   Non-contributory     Physical Exam:  BP 148/84 (BP Location: Left arm, BP Patient Position: Semi-Fowlers)    Pulse 83    Temp 98.4 F (36.9 C)    Resp 19    SpO2 98%     General: patient awake, alert, and responding to commands; no apparent distress  HEENT: hearing and vision grossly intact  Cardio: regular rate and rhythm per peripheral pulses  Respiratory: patient breathing quietly without use of accessory muscles    Spine Exam:    5/5 upper extremity grip strength w/ negative hoffman's    Lower Extremity Motor Strength  Muscle Left/5 Right/5 Comment   Hip flexion (L2) 5 5    Knee extension (L3) 5 5    Tibialis anterior (L4) 5 5    Extensor hallucis (L5) 5 5    Gastrocsoleus (S1) 5 5      Lower Extremity Sensation to Light Touch  Nerve Left Right Comment   L2 Intact Intact    L3 Intact Intact    L4 Intact Intact    L5 Intact Intact    S1 Intact Intact      Lower Extremity Special Testing        Left   Right  Clonus    Positive  Positive  Patellar Reflex:  3+   3+  Vascular:   2+ DP/PT  2+ DP/PT      Labs:  Lab Results   Component Value Date    NA 143 10/29/2021    K 3.5 10/29/2021    CL 102 10/29/2021    BICARB 26 10/29/2021    BUN 18 10/29/2021    CREAT 1.10 10/29/2021    GLU 126 (H) 10/29/2021    [place] 9.8 10/29/2021     Lab Results   Component Value Date    WBC 9.8 10/29/2021    HGB 15.9 10/29/2021    HCT 45.2 10/29/2021    PLT 191  10/29/2021     No results found for: INR, PTT  No results found for: CRP  No results found for: ESR    Imaging:  MRI Lumbar spine  (11/11):  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 Plan to admit patient for pain control with surgical scheduling pending.     Plan/Recs:  - admit to ortho spine for pain control   - Plan for OR Monday   - multimodal pain regimen  - all questions and concerns addressed at bedside    Attending of record is Dr. Lelon Huh    Horton Chin, MD  Resident Physician  [facility]

## 2021-11-04 NOTE — ED Notes (Addendum)
Per Dr. Aileen Fass, not PVR need as pt has indwelling Foley cath draining adequetly.

## 2021-11-05 LAB — HEMOGRAM, BLOOD
Hct: 40 % (ref 40.0–50.0)
Hgb: 13.9 gm/dL (ref 13.7–17.5)
MCH: 31 pg (ref 26.0–32.0)
MCHC: 34.8 g/dL (ref 32.0–36.0)
MCV: 89.3 um3 (ref 79.0–95.0)
MPV: 10.1 fL (ref 9.4–12.4)
Plt Count: 163 10*3/uL (ref 140–370)
RBC: 4.48 10*6/uL — ABNORMAL LOW (ref 4.60–6.10)
RDW: 13.6 % (ref 12.0–14.0)
WBC: 11.8 10*3/uL — ABNORMAL HIGH (ref 4.0–10.0)

## 2021-11-05 LAB — BASIC METABOLIC PANEL, BLOOD
Anion Gap: 9 mmol/L (ref 7–15)
BUN: 39 mg/dL — ABNORMAL HIGH (ref 8–23)
Bicarbonate: 27 mmol/L (ref 22–29)
Calcium: 8.3 mg/dL — ABNORMAL LOW (ref 8.5–10.6)
Chloride: 103 mmol/L (ref 98–107)
Creatinine: 1.16 mg/dL (ref 0.67–1.17)
Glucose: 118 mg/dL — ABNORMAL HIGH (ref 70–99)
Potassium: 4 mmol/L (ref 3.5–5.1)
Sodium: 139 mmol/L (ref 136–145)
eGFR Based on CKD-EPI 2021 Equation: >60 mL/min

## 2021-11-05 LAB — ECG 12-LEAD
ATRIAL RATE: 65 {beats}/min
ECG INTERPRETATION: NORMAL
P AXIS: 52 degrees
PR INTERVAL: 156 ms
QRS INTERVAL/DURATION: 98 ms
QT: 392 ms
QTc (Bazett): 407 ms
R AXIS: -14 degrees
T AXIS: 46 degrees
VENTRICULAR RATE: 65 {beats}/min

## 2021-11-05 LAB — MRSA SURVEILLANCE CULTURE

## 2021-11-05 MED ORDER — FAMOTIDINE 20 MG OR TABS
20.0000 mg | ORAL_TABLET | Freq: Two times a day (BID) | ORAL | Status: DC
Start: 2021-11-05 — End: 2021-11-12
  Administered 2021-11-05 – 2021-11-12 (×15): 20 mg via ORAL
  Filled 2021-11-05 (×14): qty 1

## 2021-11-05 MED ORDER — LORAZEPAM 1 MG OR TABS
1.0000 mg | ORAL_TABLET | Freq: Every evening | ORAL | Status: DC | PRN
Start: 2021-11-05 — End: 2021-11-12
  Administered 2021-11-05 – 2021-11-11 (×8): 1 mg via ORAL
  Filled 2021-11-05 (×7): qty 1

## 2021-11-05 MED ORDER — KETOROLAC TROMETHAMINE 15 MG/ML IJ SOLN
15.0000 mg | Freq: Once | INTRAMUSCULAR | Status: AC
Start: 2021-11-05 — End: 2021-11-05
  Administered 2021-11-05 (×2): 15 mg via INTRAVENOUS
  Filled 2021-11-05: qty 1

## 2021-11-05 MED ORDER — LORAZEPAM 2 MG/ML IJ SOLN
1.0000 mg | Freq: Once | INTRAMUSCULAR | Status: DC
Start: 2021-11-05 — End: 2021-11-05

## 2021-11-05 NOTE — Interdisciplinary (Signed)
[place] Name 11/05/21 1018       Therapy Contact Note    Contact Time 1015    Therapy not provided at this time as Other (comment)      Additional Comments OT consult received, chart reviewed. Per MD note, pain likely secondary to severe cord compression noted on prior MRI and scheduled for the OR on Monday.     Will hold off for OT evalulation until post-op and clear exisiting order. Recommend ADL participation and functional mobility with nursing staff until surgery.     Please re-order skilled OT post-op. Thank you.

## 2021-11-05 NOTE — Plan of Care (Signed)
Problem: Promotion of Health and Safety  Goal: Promotion of Health and Safety  Description: The patient remains safe, receives appropriate treatment and achieves optimal outcomes (physically, psychosocially, and spiritually) within the limitations of the disease process by discharge.    Information below is the current care plan.  Outcome: Progressing  Flowsheets  Taken 11/05/2021 1527  Guidelines: Inpatient Nursing Guidelines  Individualized Interventions/Recommendations #1: Asess and tx pain as needed.  Individualized Interventions/Recommendations #2 (if applicable): Safety and fall precations implemented due to pain in RLE/generalized weakness.  Individualized Interventions/Recommendations #3 (if applicable): Encourage OOB activities as tolerated.  Individualized Interventions/Recommendations #4 (if applicable): Monitor for BM, currently constipated, on scheduled senna.  Outcome Evaluation (rationale for progressing/not progressing) every shift: A&Ox4, VSS. Reporting persistent pain to RLE, PRN Oxycodone ineffective per patient. 1 x dose of Ketorolac given with temporary relief. Foley catheter remains in place, draining adequately. Good appetite. No BM on shift, 1 attempt made. Ambulating in room with 1 person assist and cane. Patient open to idea of PCA, due to lack of pain releif, team aware. Will continue to monitor.  Taken 11/05/2021 0754  Patient /Family stated Goal: pain control  Note:

## 2021-11-05 NOTE — Progress Notes (Signed)
[facility] Progress Note for Stephen [name]  Current Length of Stay:   1 day - Admitted on: 11/04/2021    Surgery:  09/06/19 XLIF L3-L5, PSF D9-I3 ([facility])  3/82/50 ALIF N3-Z7, revision PSF Q7-H4 ([facility])    Subjective:  Has primarily R knee pain that has been stable. Pain relatively well controlled.  Denies new numbness/tingling/weakness in extremities.     Objective:  Vital Signs:  BP 110/70 (BP Location: Right arm, BP Patient Position: Semi-Fowlers)   Pulse 60   Temp 98.3 F (36.8 C)   Resp 17   Ht 5\' 6"  (1.676 m)   Wt 88.8 kg (195 lb 12.3 oz)   SpO2 94%   BMI 31.60 kg/m     Physical Exam:  General: patient awake, alert, and responding to commands; no apparent distress  HEENT: hearing and vision grossly intact  Cardio: regular rate and rhythm per peripheral pulses  Respiratory: patient breathing quietly without use of accessory muscles    Spine Exam:    5/5 upper extremity grip strength w/ negative hoffman's    Lower Extremity Motor Strength  Muscle Left/5 Right/5 Comment   Hip flexion (L2) 5 5    Knee extension (L3) 5 5    Tibialis anterior (L4) 5 5    Extensor hallucis (L5) 5 5    Gastrocsoleus (S1) 5 5      Lower Extremity Sensation to Light Touch  Nerve Left Right Comment   L2 Intact Intact    L3 Intact Intact    L4 Intact Intact    L5 Intact Intact    S1 Intact Intact      Lower Extremity Special Testing                                                                  Left                              Right  Clonus                                     Positive                       Positive  Patellar Reflex:                       3+                                3+  Vascular:                                 2+ DP/PT                    2+ DP/PT      Laboratory Data:   Lab Results   Component Value Date    WBC 14.6 (H) 11/04/2021    HGB 15.9 11/04/2021    HCT 45.3 11/04/2021  PLT 223 11/04/2021       Input/Output:    Intake/Output Summary (Last 24 hours) at  11/05/2021 0653  Last data filed at 11/05/2021 0547  Gross per 24 hour   Intake 240 ml   Output 1650 ml   Net -1410 ml       Current Medications:  Current Facility-Administered Medications   Medication   . acetaminophen (TYLENOL) tablet 975 mg   . allopurinol (ZYLOPRIM) tablet 300 mg   . amLODIPINE (NORVASC) tablet 10 mg   . DULoxetine (CYMBALTA) CR capsule 20 mg   . famotidine (PEPCID) tablet 20 mg   . lidocaine (ASPERCREME) 4 % patch 1 patch   . LORazepam (ATIVAN) tablet 1 mg   . oxyCODONE (ROXICODONE) tablet 5 mg    Or   . oxyCODONE (ROXICODONE) tablet 10 mg   . oxyCODONE (ROXICODONE) tablet 5 mg   . pregabalin (LYRICA) capsule 100 mg   . 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 Plan to admit patient for pain control with surgical scheduling pending.     Plan/Recs:  - admit to ortho spine for pain control   - Plan for OR Monday   - multimodal pain regimen  - all questions and concerns addressed at bedside      Please page the Orthopaedic Spine Surgery Team with questions or concerns based on patient location:     Established spine floor patients at [facility]: Ortho Spine 1 - [phone]      Established spine floor patients at [facility]: Ortho Spine 2 - [phone]      New spine consultations not yet followed by spine team:    View [facility] on call for "SPINE CONSULT" call schedule (Orthopaedic Spine versus Neurosurgery)   If Orthopaedic Spine is on call please page the [facility] on call:   [place]: ORTHOPEDICS/TH   [facility]: ORTHOPEDICS/HC

## 2021-11-05 NOTE — Plan of Care (Signed)
Problem: Promotion of Health and Safety  Goal: Promotion of Health and Safety  Description: The patient remains safe, receives appropriate treatment and achieves optimal outcomes (physically, psychosocially, and spiritually) within the limitations of the disease process by discharge.    Information below is the current care plan.  11/05/2021 0104 by Berdine Addison, RN  Outcome: Progressing  Flowsheets (Taken 11/05/2021 0104)  Individualized Interventions/Recommendations #1: oriented to room and floor, instructed to use call light for needs and assistance  Individualized Interventions/Recommendations #2 (if applicable): safety and fall precautions implemented, bed at low position, call light within reach, bed alarm on  Individualized Interventions/Recommendations #3 (if applicable): assessed and monitored level of pain and give pain med as needed  Individualized Interventions/Recommendations #4 (if applicable): ativan given for sleep  Outcome Evaluation (rationale for progressing/not progressing) every shift: VSS, afebrile, not in distress, pain controlled with current pain regimen. will continue plan of care  Note:          11/05/2021 0051 by Casimir Barcellos, Enid Baas, RN  Outcome: Progressing  Note:

## 2021-11-05 NOTE — Interdisciplinary (Signed)
PT Contact     [place] Name 11/05/21 1428       Therapy Contact Note    Contact Time 1400    Additional Comments PT orders were received and chart reviewed. Per MD note, pain likely secondary to severe cord compression noted on prior MRI and scheduled for the OR on Monday.  Will hold  PT evalulation until post-op and discontinue exisiting order. Recommend functional mobility with nursing staff until surgery.  Please re-order Physical Therapy post-op. Thank you.

## 2021-11-06 ENCOUNTER — Ambulatory Visit (HOSPITAL_BASED_OUTPATIENT_CLINIC_OR_DEPARTMENT_OTHER): Payer: Medicare Other

## 2021-11-06 LAB — URINE CULTURE: Urine Culture Result: NO GROWTH

## 2021-11-06 MED ORDER — OXYCODONE HCL 10 MG OR TABS
10.0000 mg | ORAL_TABLET | ORAL | Status: DC | PRN
Start: 2021-11-06 — End: 2021-11-07
  Administered 2021-11-06 (×2): 10 mg via ORAL
  Filled 2021-11-06: qty 1

## 2021-11-06 MED ORDER — OXYCODONE HCL 5 MG OR TABS
15.0000 mg | ORAL_TABLET | ORAL | Status: DC | PRN
Start: 2021-11-06 — End: 2021-11-07
  Administered 2021-11-06 – 2021-11-07 (×6): 15 mg via ORAL
  Filled 2021-11-06 (×5): qty 1

## 2021-11-06 MED ORDER — OXYCODONE HCL 10 MG OR TABS
10.0000 mg | ORAL_TABLET | ORAL | Status: DC | PRN
Start: 2021-11-06 — End: 2021-11-07

## 2021-11-06 NOTE — Telephone Encounter (Signed)
Returned patient call. Left voicemail to please call me back to schedule surgery.

## 2021-11-06 NOTE — Interdisciplinary (Signed)
11/06/21 1111   Initial Assessment   CM Initial Assessment * Completed   Readmission Risk Assessment   Readmission Within 30 Days of Discharge * Yes   Admission Was* Unplanned   Explanation* Pain not under control   Did you see your doctor before coming back to hospital Yes   Did you have the help you needed at home to take care of yourself Yes   Were you able to get your medications upon discharge? Yes   30 Day Readmission Comments* Pain likely secondary to severe cord compression noted on prior MRI   Recent Hospitalizations (Within Last 6 Months) * Yes   High Risk For Readmission * Yes   High Risk Indicators Multiple diagnoses and comorbidities;Chronic illness   Action Taken To Prevent Readmission After Discharge * Home health referral;Social work evaluation and recommendations;PT OT ST evaluation and recommendation;Hospital pharmacy intervention at the bedside   Recommendations to the Physician* To be determined   Patient Information   Where was the patient admitted from? * Home   Address on Facesheet correct?* Yes   Patient contact phone number on Facesheet correct? Yes   PCP listed on Facesheet correct? Yes   Prior to Level of Function * Ambulatory;Independent with ADL's   Assistive Device * Walker;Cane   Home Care Services * Yes   Type of Home Care Services on Admission None   Additional Services on Admission Not Applicable   Available Assistance/Support System  and Prior Community Resources Family member(s)   Primary Caretaker(s) * Family   Primary Family/Caregiver Contact Name, Number and Relationship * sister [name] [phone]   Permission to Contact * Yes   Social Worker Consult   Do you need to see a Education officer, museum? * Yes   Income Information   Income Source Designer, multimedia History Not Applicable   V[employer] Affiliation No   Do you have difficulty affording your medications No   Discharge Planning   Living Arrangements * Family Member   Type of Residence *  One Story Home   Stairs/Steps to home  No   Patient's Discharge Goal(s) Home;Home Health   Anticipated Discharge Disposition/Needs Home   Anticipated DischargeTransportation *  Family/Friend   Barriers to Discharge * None   Patient Engaged in Discharge Planning * Yes   PATIENT CHOICE: Patient/Family/Legal/Surrogate Decision Maker Has Been Given a List Options And Choice In The Selection of Post-Acute Care Providers Yes   CM discussed the following with pt, and/or family, and/or DPOA If patient has chosen [facility] or [facility] for post-acute care, they were informed that we partner with, and have a financial interest in these organizations   Family/Caregiver's Assessed for * Readiness, willingness, and ability to provide or support self-management activities   Respite Care * Not Applicable   Patient/Family/Other Are In Agreement With Discharge Plan * Yes   Vaccinated for [place] Clearance Needed * No   Medical Necessity:    Pt with recent admission for spinal stenosis (11/11), readmitted for back pain and leg pain.   Pain likely secondary to severe cord compression noted on prior MRI. Plan to admit patient for pain control with surgery on Monday.        LACE+ Score: 88    Address verified as discharge address:   [address] [place] [address] [phone] (home)    PCP verified:  Hassell Done  [place] / [place] [address]  telephone [phone]  fax [phone]    Pharmacy:  [id number] [place] - [id number]    PLOF:  I-ADLs    Hx of SNF placement:  none    [place] [facility]  [facility]  - Use previous home health agency upon discharge (yes/no): yes    DME (includes ALL home equipment): cane, FWW    Hx of HD/PD:  Maintenance Dialysis History    Patient has no recorded history of maintenance dialysis.     Clinic:  Address:   Phone:  Chair schedule:  Transport:  Verified by:     DISCHARGE  PLANNING    Support system:  sister, brother in law    Anticipated DC disposition (home, SNF, etc) :  TBD    Anticipated DC needs (HH, DME, none, etc):  TBD    Anticipated barriers to discharge:   none    Transportation: family             Expected discharge date:  2-3days     Met with patient at the bedside. He [facility] his sister and brother in [facility] is I-ADLs, started using cane, mostly to manage pain. He was complaining of pain during the assessment, asking for PCA. Surgery scheduled for Monday.       SMarjory Sneddon RN, MSN, CCM  Case Manager  [phone]

## 2021-11-06 NOTE — Telephone Encounter (Signed)
Patient scheduled for surgery 11/08/21.

## 2021-11-06 NOTE — Progress Notes (Signed)
[facility] Progress Note for Stephen [name]  Current Length of Stay:   2 days - Admitted on: 11/04/2021    Surgery:  09/06/19 XLIF L3-L5, PSF O0-B5 ([facility]lic)  5/97/41 ALIF U3-A4, revision PSF T3-M4 ([facility]islic)    Subjective:  Con;'t leg pain  Objective:  Vital Signs:  BP (!) 164/80 (BP Location: Right arm, BP Patient Position: Semi-Fowlers)   Pulse 65   Temp 98 F (36.7 C)   Resp 18   Ht 5\' 6"  (1.676 m)   Wt 88.8 kg (195 lb 12.3 oz)   SpO2 99%   BMI 31.60 kg/m     Physical Exam:  General: patient awake, alert, and responding to commands; no apparent distress  HEENT: hearing and vision grossly intact  Cardio: regular rate and rhythm per peripheral pulses  Respiratory: patient breathing quietly without use of accessory muscles    Spine Exam:    5/5 upper extremity grip strength w/ negative hoffman's    Lower Extremity Motor Strength  Muscle Left/5 Right/5 Comment   Hip flexion (L2) 5 5    Knee extension (L3) 5 5    Tibialis anterior (L4) 5 5    Extensor hallucis (L5) 5 5    Gastrocsoleus (S1) 5 5      Lower Extremity Sensation to Light Touch  Nerve Left Right Comment   L2 Intact Intact    L3 Intact Intact    L4 Intact Intact    L5 Intact Intact    S1 Intact Intact      Lower Extremity Special Testing                                                                  Left                              Right  Clonus                                     Positive                       Positive  Patellar Reflex:                       3+                                3+  Vascular:                                 2+ DP/PT                    2+ DP/PT      Laboratory Data:   Lab Results   Component Value Date    WBC 11.8 (H) 11/05/2021    HGB 13.9 11/05/2021    HCT 40.0 11/05/2021    PLT 163 11/05/2021       Input/Output:    Intake/Output Summary (Last 24  hours) at 11/05/2021 0653  Last data filed at 11/05/2021 0547  Gross per 24 hour   Intake 240 ml   Output 1650 ml   Net -1410 ml        Current Medications:  Current Facility-Administered Medications   Medication   . acetaminophen (TYLENOL) tablet 975 mg   . allopurinol (ZYLOPRIM) tablet 300 mg   . amLODIPINE (NORVASC) tablet 10 mg   . DULoxetine (CYMBALTA) CR capsule 20 mg   . famotidine (PEPCID) tablet 20 mg   . lidocaine (ASPERCREME) 4 % patch 1 patch   . LORazepam (ATIVAN) tablet 1 mg   . oxyCODONE (ROXICODONE) tablet 10 mg    Or   . oxyCODONE (ROXICODONE) tablet 15 mg   . oxyCODONE (ROXICODONE) tablet 10 mg   . pregabalin (LYRICA) capsule 100 mg   . 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: Ortho Spine 1 - [phone]      Established spine floor patients at [facility]: Ortho Spine 2 - [phone]      New spine consultations not yet followed by spine team:    [facility] on call for "SPINE CONSULT" call schedule (Orthopaedic Spine versus Neurosurgery)   If Orthopaedic Spine is on call please page the [facility] on call:   [place]: ORTHOPEDICS/TH   [facility]: ORTHOPEDICS/HC

## 2021-11-06 NOTE — Plan of Care (Signed)
Problem: Promotion of Health and Safety  Goal: Promotion of Health and Safety  Description: The patient remains safe, receives appropriate treatment and achieves optimal outcomes (physically, psychosocially, and spiritually) within the limitations of the disease process by discharge.    Information below is the current care plan.  11/06/2021 1832 by Maryruth Hancock, RN  Outcome: Progressing  Flowsheets  Taken 11/06/2021 1832 by Maryruth Hancock, RN  Patient /Family stated Goal: pain relief  Outcome Evaluation (rationale for progressing/not progressing) every shift: A&OX4. Pt c/o of 10/10 back and leg pain. MD increased Oxycodone dose from 5-10 mg to 10-15 mg PRN. 15 mg dose effective for patient. Still waiting on patient to have BM. On scheduled Senna. Decompressive surgery pending, scheduled for Monday 11/21.  Taken 11/05/2021 1527 by Chrystie Nose, RN  Guidelines: Inpatient Nursing Guidelines  Individualized Interventions/Recommendations #1: Asess and tx pain as needed.  Individualized Interventions/Recommendations #2 (if applicable): Safety and fall precations implemented due to pain in RLE/generalized weakness.  Individualized Interventions/Recommendations #3 (if applicable): Encourage OOB activities as tolerated.  Individualized Interventions/Recommendations #4 (if applicable): Monitor for BM, currently constipated, on scheduled senna.  Note:

## 2021-11-07 LAB — TYPE & SCREEN
ABO/RH: A POS
Antibody Screen: NEGATIVE

## 2021-11-07 MED ORDER — HYDROMORPHONE PCA 0.2 MG/ML SYRINGE
INTRAMUSCULAR | Status: AC
Start: 2021-11-07 — End: ?
  Filled 2021-11-07 (×4): qty 50

## 2021-11-07 NOTE — Progress Notes (Signed)
[facility] Progress Note  [place]  Current Length of Stay:   3 days - Admitted on: 11/04/2021    Surgery:  09/06/19 XLIF L3-L5, PSF Z9-D3 ([facility]lic)  5/70/17 ALIF B9-T9, revision PSF Q3-E0 (Zlomislic)    Subjective:  OR tomorrow  NPO at midnight     Objective:  Vital Signs:  BP 123/67 (BP Location: Right arm, BP Patient Position: Semi-Fowlers)   Pulse 68   Temp 97.7 F (36.5 C)   Resp 16   Ht 5\' 6"  (1.676 m)   Wt 88.8 kg (195 lb 12.3 oz)   SpO2 96%   BMI 31.60 kg/m     Physical Exam:  General: patient awake, alert, and responding to commands; no apparent distress  HEENT: hearing and vision grossly intact  Cardio: regular rate and rhythm per peripheral pulses  Respiratory: patient breathing quietly without use of accessory muscles    Spine Exam:    5/5 upper extremity grip strength w/ negative hoffman's    Lower Extremity Motor Strength  Muscle Left/5 Right/5 Comment   Hip flexion (L2) 5 5    Knee extension (L3) 5 5    Tibialis anterior (L4) 5 5    Extensor hallucis (L5) 5 5    Gastrocsoleus (S1) 5 5      Lower Extremity Sensation to Light Touch  Nerve Left Right Comment   L2 Intact Intact    L3 Intact Intact    L4 Intact Intact    L5 Intact Intact    S1 Intact Intact      Lower Extremity Special Testing                                                                  Left                              Right  Clonus                                     Positive                       Positive  Patellar Reflex:                       3+                                3+  Vascular:                                 2+ DP/PT                    2+ DP/PT      Laboratory Data:   Lab Results   Component Value Date    WBC 11.8 (H) 11/05/2021    HGB 13.9 11/05/2021    HCT 40.0 11/05/2021    PLT 163 11/05/2021       Input/Output:    Intake/Output  Summary (Last 24 hours) at 11/05/2021 0653  Last data filed at 11/05/2021 0547  Gross per 24 hour   Intake 240 ml   Output 1650 ml   Net -1410 ml        Current Medications:  Current Facility-Administered Medications   Medication   . acetaminophen (TYLENOL) tablet 975 mg   . allopurinol (ZYLOPRIM) tablet 300 mg   . amLODIPINE (NORVASC) tablet 10 mg   . DULoxetine (CYMBALTA) CR capsule 20 mg   . famotidine (PEPCID) tablet 20 mg   . HYDROmorphone (DILAUDID) 0.2 mg/mL PCA syringe   . lidocaine (ASPERCREME) 4 % patch 1 patch   . LORazepam (ATIVAN) tablet 1 mg   . pregabalin (LYRICA) capsule 100 mg   . 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, and recent admission for spinal stenosis (11/11) who presents with continued lower back pain and leg pain      Plan/Recs:  - admit to ortho spine for pain control   - Plan for OR Monday   - multimodal pain regimen  - all questions and concerns addressed at bedside      Please page the Orthopaedic Spine Surgery Team with questions or concerns based on patient location:     Established spine floor patients at [place]: Ortho Spine 1 - [phone]      Established spine floor patients at [facility]): Ortho Spine 2 - [phone]      New spine consultations not yet followed by spine team:    [facility] on call for "SPINE CONSULT" call schedule (Orthopaedic Spine versus Neurosurgery)   If Orthopaedic Spine is on call please page the [place] on call:   [place]: ORTHOPEDICS/TH   [place]: ORTHOPEDICS/HC

## 2021-11-07 NOTE — Anesthesia Preprocedure Evaluation (Signed)
ANESTHESIA PRE-OPERATIVE EVALUATION    Patient Information    Name: Stephen [name]    MRN: [id number]    DOB: Dec 26, 1950    Age: 70 year old    Sex: male  Procedure(s):  Revision lumbar spine, posterior, lumbar Lumbar 1-Pelvis  FUSION, SPINE, LUMBAR, XLIF, 1 LEVEL      Pre-op Vitals:   There were no vitals taken for this visit.        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 Does not use cane or walker at baseline (before a couple days ago). Pain started RIGHT medial knee on Tues/Wed this week and now involves the whole right anterior/medial thigh. Severe constant throbbing/burning pain.         Previous Surgeries:  09/06/19 XLIF L3-L5, PSF B2-W4 ([facility])  1/32/44 ALIF W1-U2, revision PSF V2-Z3 (Zlomislic)    General:  positive for Obesity,  not able to climb flight of stairs//Exercise tolaerance <4 mets,   Cardiovascular:  EKG 11/22: NSR   Anesthesia History:  history of difficult intubation,  G3V required bougie with DL  [facility] Pulmonary:      Neuro/Psych:   psychiatric history,  Depression Hematology/Oncology:   history of cancer,      GI/Hepatic:   Infectious Disease:     Renal:  chronic renal disease,  Congenital Hydronephrosis s/p nephrectomy (R) Endocrine/Other:  arthritis,   back pain,     Pregnancy History:   Pediatrics:         Pre Anesthesia Testing (PCC/CPC) notes/comments:                 Physical Exam    Airway:    Inter-inciser distance > 4 cm  Prognanth Unable    Mallampati: II  Neck ROM: limited  TM distance: > 6 cm  Short thick neck: No          Cardiovascular:  - cardiovascular exam normal         Pulmonary:  - pulmonary exam normal            Neuro/Neck/Skeletal/Skin:  - Neck/Neuro/Skeletal/Skin exam normal          Dental:    Comment: Multiple decayed teeth       Abdominal:   - normal exam         Additional Clinical Notes:               Last  OSA (STOP BANG) Score:  No data recorded    Last OSA  (STOP) Score for   No data recorded                 Past Medical History:   Diagnosis Date   . Chronic back pain    . Congenital hydronephrosis    . Gout    . Headache    . Hematuria    . Kidney disease    . Kidney stones    . Major depressive disorder, single episode    . Polyarthropathy or polyarthritis of multiple sites    . Retinal detachment    . Urethral stricture      Past Surgical History:   Procedure  Laterality Date   . CT INSERTION OF SUPRAPUBIC CATH  09/25/2015   . NEPHRECTOMY Right 1995   . APPENDECTOMY     . COLONOSCOPY     . CYSTOSCOPY     . CYSTOSCOPY W/ LASER LITHOTRIPSY     . OTHER SURGICAL HISTORY      Interstim 01/29/2011   . SPINE SURGERY  09/21    Lumbar-sacral fusion   . TRANSURETHRAL RESECTION OF PROSTATE       Social History     Socioeconomic History   . Marital status: Single   Tobacco Use   . Smoking status: Never   . Smokeless tobacco: Never   Substance and Sexual Activity   . Alcohol use: No   . Drug use: Not Currently   . Sexual activity: Not Currently     Partners: Female   Other Topics Concern   . Military Service No   . Blood Transfusions Yes   . Caffeine Concern No   . Occupational Exposure No   . Hobby Hazards No   . Sleep Concern Yes     Comment: due to my lower back discomfort and leg discomfort   . Stress Concern Yes     Comment: Health/ housing/ financial   . Weight Concern No   . Special Diet No   . Back Care Yes     Comment: Am very careful with any activity   . Exercises Regularly Yes   . Seat Belt Use Yes   . Performs Self-Exams Yes   . Bike Helmet Use No     Alcohol Use: Not on file       No current facility-administered medications for this encounter.     No current outpatient medications on file.      Facility-Administered Medications Ordered in Other Encounters   Medication Dose Route Frequency Provider Last Rate Last Admin   . acetaminophen (TYLENOL) tablet 975 mg  975 mg Oral Q8H Korrapati, Avinaash, MD   975 mg at 11/07/21 1323   . allopurinol (ZYLOPRIM) tablet 300 mg  300 mg Oral Daily Horton Chin, MD   300 mg at 11/07/21 0813   . amLODIPINE (NORVASC) tablet 10 mg  10 mg Oral Daily Horton Chin, MD   10 mg at 11/07/21 0814   . DULoxetine (CYMBALTA) CR capsule 20 mg  20 mg Oral Daily Horton Chin, MD   20 mg at 11/07/21 0813   . famotidine (PEPCID) tablet 20 mg  20 mg Oral Q12H Hulbert, Latrelle Dodrill, MD   20 mg at 11/07/21 0813   . HYDROmorphone (DILAUDID) 0.2 mg/mL PCA syringe   IntraVENOUS Continuous Clide Dales, MD   New Syringe/Cartridge at 11/07/21 1034   . lidocaine (ASPERCREME) 4 % patch 1 patch  1 patch Transdermal Q24H Horton Chin, MD   1 patch at 11/05/21 0800   . LORazepam (ATIVAN) tablet 1 mg  1 mg Oral Nightly PRN Jaci Lazier, MD   1 mg at 11/06/21 2007   . pregabalin (LYRICA) capsule 100 mg  100 mg Oral HS Korrapati, Avinaash, MD   100 mg at 11/06/21 2141   . senna (SENOKOT) tablet 8.6 mg  8.6 mg Oral Daily Korrapati, Avinaash, MD   8.6 mg at 11/07/21 0813     Allergies   Allergen Reactions   . Amoxicillin Rash   . Sulfa Drugs Unspecified       Labs and Other Data  Lab Results   Component Value Date  NA 139 11/05/2021    K 4.0 11/05/2021    CL 103 11/05/2021    BICARB 27 11/05/2021    BUN 39 (H) 11/05/2021    CREAT 1.16 11/05/2021    GLU 118 (H) 11/05/2021    [place] 8.3 (L) 11/05/2021     Lab Results   Component Value Date    AST 21 11/04/2021    ALT 19 11/04/2021    ALK 74 11/04/2021    TP 7.1 11/04/2021    ALB 4.6 11/04/2021    TBILI 1.42 (H) 11/04/2021     Lab Results   Component Value Date    WBC 11.8 (H) 11/05/2021    RBC 4.48 (L) 11/05/2021    HGB 13.9 11/05/2021    HCT 40.0 11/05/2021    MCV 89.3 11/05/2021    MCHC 34.8 11/05/2021    RDW 13.6  11/05/2021    PLT 163 11/05/2021    MPV 10.1 11/05/2021    SEG 87 11/04/2021    LYMPHS 7 11/04/2021    MONOS 6 11/04/2021    EOS 0 11/04/2021    BASOS 0 11/04/2021     Lab Results   Component Value Date    INR 1.0 11/04/2021    PTT 24 (L) 11/04/2021     Lab Results   Component Value Date    ARTPH 7.42 08/18/2020    ARTPO2 218 (H) 08/18/2020    ARTPCO2 35 (L) 08/18/2020       Anesthesia Plan:  Risks and Benefits of Anesthesia  I have personally performed an appropriate pre-anesthesia physical exam of the patient (including heart, lungs, and airway) prior to the anesthetic and reviewed the pertinent medical history, drug and allergy history, laboratory and imaging studies and consultations.   I have determined that the patient has had adequate assessment and testing.  I have validated the documentation of these elements of the patient exam and/or have made necessary changes to reflect my own observations during my pre-anesthesia exam.  Anesthetic techniques, invasive monitors, anesthetic drugs for induction, maintenance and post-operative analgesia, risks and alternatives have been explained to the patient and/or patient's representatives.    I have prescribed the anesthetic plan:         Planned anesthesia method: General         ASA 3 (Severe systemic disease)     Potential anesthesia problems identified and risks including but not limited to the following were discussed with patient and/or patient's representative: Adverse or allergic drug reaction, Administration of blood products, Recall, Ocular injury, Dental injury or sore throat, Nerve injury, Injury to brain, heart and other organs and Patient declined further  discussion of risks of anesthesia    No Beta Blocker Indicated: Patient not on beta blockers    Planned monitoring method: Arterial line monitoring    Informed Consent:  Anesthetic plan and risks discussed with Patient.    Plan discussed with CRNA, Attending, Surgeon and OR Nurse.

## 2021-11-07 NOTE — Plan of Care (Addendum)
Problem: Promotion of Health and Safety  Goal: Promotion of Health and Safety  Description: The patient remains safe, receives appropriate treatment and achieves optimal outcomes (physically, psychosocially, and spiritually) within the limitations of the disease process by discharge.    Information below is the current care plan.  Outcome: Progressing  Flowsheets  Taken 11/07/2021 0119  Guidelines: Inpatient Nursing Guidelines  Individualized Interventions/Recommendations #1: Encouraged use of call light for assistance. Indwelling foley catheter unobstructed, draining good volume of clear, yellow urine. Hourly rounds ongoing. Patient safety maintained.  Individualized Interventions/Recommendations #2 (if applicable): Vital signs stable and afebrile. Pain controlled. Medications administered as ordered. Patient rested well without any signs of distress.  Individualized Interventions/Recommendations #3 (if applicable): Patient care clustered to encourage rest and healing.  Taken 11/06/2021 2000  Patient /Family stated Goal: Pain relief and rest

## 2021-11-08 ENCOUNTER — Inpatient Hospital Stay (HOSPITAL_COMMUNITY): Admission: RE | Admit: 2021-11-08 | Payer: Medicare Other | Admitting: Orthopaedic Surgery of the Spine

## 2021-11-08 ENCOUNTER — Inpatient Hospital Stay (HOSPITAL_COMMUNITY): Payer: Medicare Other | Admitting: Chronic Disease Hospital

## 2021-11-08 ENCOUNTER — Ambulatory Visit (HOSPITAL_BASED_OUTPATIENT_CLINIC_OR_DEPARTMENT_OTHER): Payer: Medicare Other | Admitting: Orthopaedic Surgery of the Spine

## 2021-11-08 ENCOUNTER — Encounter (HOSPITAL_COMMUNITY)
Admission: EM | Disposition: A | Discharge status: Home Health | Payer: Self-pay | Attending: Orthopaedic Surgery of the Spine

## 2021-11-08 ENCOUNTER — Inpatient Hospital Stay (HOSPITAL_BASED_OUTPATIENT_CLINIC_OR_DEPARTMENT_OTHER): Payer: Medicare Other

## 2021-11-08 DIAGNOSIS — M4316 Spondylolisthesis, lumbar region: Secondary | ICD-10-CM

## 2021-11-08 DIAGNOSIS — G834 Cauda equina syndrome: Secondary | ICD-10-CM

## 2021-11-08 DIAGNOSIS — Z472 Encounter for removal of internal fixation device: Secondary | ICD-10-CM

## 2021-11-08 DIAGNOSIS — Z981 Arthrodesis status: Secondary | ICD-10-CM

## 2021-11-08 DIAGNOSIS — M48062 Spinal stenosis, lumbar region with neurogenic claudication: Principal | ICD-10-CM

## 2021-11-08 DIAGNOSIS — M48061 Spinal stenosis, lumbar region without neurogenic claudication: Secondary | ICD-10-CM

## 2021-11-08 DIAGNOSIS — M5416 Radiculopathy, lumbar region: Secondary | ICD-10-CM

## 2021-11-08 DIAGNOSIS — M5136 Other intervertebral disc degeneration, lumbar region: Secondary | ICD-10-CM

## 2021-11-08 LAB — ABG+O2HBA+O2S A+O2CNA
Arterial pF Ratio: 271 mmHg
Arterial pF Ratio: 290 mmHg
Arterial pF Ratio: 350 mmHg
BE, Art: 3.5 mmol/L — ABNORMAL HIGH (ref <=1.2)
BE, Art: 1.7 mmol/L — ABNORMAL HIGH (ref <=1.2)
BE, Art: 0.9 mmol/L (ref <=1.2)
FIO2: 80 %
FIO2: 80 %
FIO2: 60 %
HCO3, Art: 28 mmol/L (ref 23–29)
HCO3, Art: 26 mmol/L (ref 23–29)
HCO3, Art: 26 mmol/L (ref 23–29)
Hct (Est), Art: 38 % — ABNORMAL LOW (ref 40–50)
Hct (Est), Art: 37 % — ABNORMAL LOW (ref 40–50)
Hct (Est), Art: 38 % — ABNORMAL LOW (ref 40–50)
Hgb, Art: 12.8 g/dL — ABNORMAL LOW (ref 14.0–17.0)
Hgb, Art: 12.4 g/dL — ABNORMAL LOW (ref 14.0–17.0)
Hgb, Art: 12.7 g/dL — ABNORMAL LOW (ref 14.0–17.0)
O2 Content, Art: 17.9 vol % (ref 15.0–23.0)
O2 Content, Art: 17.5 vol % (ref 15.0–23.0)
O2 Content, Art: 17.8 vol % (ref 15.0–23.0)
O2 Hgb, Art: 96.8 % (ref 95.0–97.0)
O2 Hgb, Art: 97.3 % — ABNORMAL HIGH (ref 95.0–97.0)
O2 Hgb, Art: 97.2 % — ABNORMAL HIGH (ref 95.0–97.0)
O2 Sat, Art: 99.3 % (ref 94.0–100.0)
O2 Sat, Art: 100.5 % — ABNORMAL HIGH (ref 94.0–100.0)
O2 Sat, Art: 100.4 % — ABNORMAL HIGH (ref 94.0–100.0)
Temp: 35 'C
Temp: 35 'C
Temp: 37.5 'C
pCO2, Art (T): 43 mmHg (ref 36–46)
pCO2, Art (T): 38 mmHg (ref 36–46)
pCO2, Art (T): 32 mmHg — ABNORMAL LOW (ref 36–46)
pCO2, Art (Uncorr): 47 mmHg — ABNORMAL HIGH (ref 36–46)
pCO2, Art (Uncorr): 42 mmHg (ref 36–46)
pCO2, Art (Uncorr): 31 mmHg — ABNORMAL LOW (ref 36–46)
pH, Art (T): 7.43 (ref 7.35–7.46)
pH, Art (T): 7.44 (ref 7.35–7.46)
pH, Art (T): 7.48 — ABNORMAL HIGH (ref 7.35–7.46)
pH, Art (Uncorr): 7.4 (ref 7.35–7.46)
pH, Art (Uncorr): 7.41 (ref 7.35–7.46)
pH, Art (Uncorr): 7.49 — ABNORMAL HIGH (ref 7.35–7.46)
pO2, Art (T): 207 mmHg — ABNORMAL HIGH (ref 74–109)
pO2, Art (T): 223 mmHg — ABNORMAL HIGH (ref 74–109)
pO2, Art (T): 213 mmHg — ABNORMAL HIGH (ref 74–109)
pO2, Art (Uncorr): 217 mmHg — ABNORMAL HIGH (ref 74–109)
pO2, Art (Uncorr): 232 mmHg — ABNORMAL HIGH (ref 74–109)
pO2, Art (Uncorr): 210 mmHg — ABNORMAL HIGH (ref 74–109)

## 2021-11-08 LAB — GLUCOSE, ARTERIAL WHOLE BLOOD
Glucose, Art: 126 mg/dL — ABNORMAL HIGH (ref 65–110)
Glucose, Art: 166 mg/dL — ABNORMAL HIGH (ref 65–110)
Glucose, Art: 121 mg/dL — ABNORMAL HIGH (ref 65–110)

## 2021-11-08 LAB — CALCIUM, IONIZED, ARTERIAL
Ca Ionized, Art: 1.08 mmol/L — ABNORMAL LOW (ref 1.13–1.32)
Ca Ionized, Art: 1.06 mmol/L — ABNORMAL LOW (ref 1.13–1.32)
Ca Ionized, Art: 1.1 mmol/L — ABNORMAL LOW (ref 1.13–1.32)

## 2021-11-08 LAB — SODIUM, ART WHOLE BLOOD
Sodium, Art: 132 mmol/L — ABNORMAL LOW (ref 135–145)
Sodium, Art: 131 mmol/L — ABNORMAL LOW (ref 135–145)
Sodium, Art: 130 mmol/L — ABNORMAL LOW (ref 135–145)

## 2021-11-08 LAB — POTASSIUM, ART WHOLE BLOOD
Potassium, Art: 3.7 mmol/L (ref 3.5–5.0)
Potassium, Art: 4.1 mmol/L (ref 3.5–5.0)
Potassium, Art: 4.2 mmol/L (ref 3.5–5.0)

## 2021-11-08 SURGERY — REVISION, FUSION, SPINE, LUMBAR, POSTERIOR APPROACH
Anesthesia: General | Site: Spine Lumbar | Wound class: Class I (Clean)

## 2021-11-08 MED ORDER — THROMBIN (RECOMBINANT) 20000 UNIT EX SOLR
CUTANEOUS | Status: AC
Start: 2021-11-08 — End: ?
  Filled 2021-11-08: qty 2

## 2021-11-08 MED ORDER — DIPHENHYDRAMINE HCL 50 MG/ML IJ SOLN
12.5000 mg | Freq: Once | INTRAMUSCULAR | Status: DC | PRN
Start: 2021-11-08 — End: 2021-11-09

## 2021-11-08 MED ORDER — PROPOFOL 200 MG/20ML IV EMUL
INTRAVENOUS | Status: DC | PRN
Start: 2021-11-08 — End: 2021-11-08
  Administered 2021-11-08: 14:00:00 150 mg via INTRAVENOUS
  Administered 2021-11-08: 30 mg via INTRAVENOUS
  Administered 2021-11-08: 150 mg via INTRAVENOUS

## 2021-11-08 MED ORDER — HYDROMORPHONE HCL 1 MG/ML IJ SOLN
INTRAMUSCULAR | Status: DC
Start: 2021-11-08 — End: 2021-11-09
  Filled 2021-11-08: qty 1

## 2021-11-08 MED ORDER — SODIUM CHLORIDE 0.9 % IV SOLN
INTRAVENOUS | Status: DC | PRN
Start: 2021-11-08 — End: 2021-11-08

## 2021-11-08 MED ORDER — KETAMINE HCL 50 MG/5ML IJ SOSY
PREFILLED_SYRINGE | INTRAMUSCULAR | Status: DC | PRN
Start: 2021-11-08 — End: 2021-11-08
  Administered 2021-11-08 (×2): 10 mg via INTRAVENOUS
  Administered 2021-11-08: 15 mg via INTRAVENOUS
  Administered 2021-11-08: 45 mg via INTRAVENOUS
  Administered 2021-11-08 (×3): 10 mg via INTRAVENOUS

## 2021-11-08 MED ORDER — NALOXONE HCL 0.4 MG/ML IJ SOLN
0.1000 mg | INTRAMUSCULAR | Status: DC | PRN
Start: 2021-11-08 — End: 2021-11-09

## 2021-11-08 MED ORDER — FENTANYL CITRATE (PF) 50 MCG/ML IJ SOLN (WRAPPED RECORD) ~~LOC~~
50.0000 ug | INTRAMUSCULAR | Status: DC | PRN
Start: 2021-11-08 — End: 2021-11-09

## 2021-11-08 MED ORDER — LACTATED RINGERS IV SOLN
INTRAVENOUS | Status: AC
Start: 2021-11-08 — End: 2021-11-09

## 2021-11-08 MED ORDER — DEXAMETHASONE SODIUM PHOSPHATE 4 MG/ML IJ SOLN (CUSTOM)
INTRAMUSCULAR | Status: DC | PRN
Start: 2021-11-08 — End: 2021-11-08
  Administered 2021-11-08 (×2): 10 mg via INTRAVENOUS

## 2021-11-08 MED ORDER — FENTANYL CITRATE (PF) 100 MCG/2ML IJ SOLN
INTRAMUSCULAR | Status: AC
Start: 2021-11-08 — End: ?
  Filled 2021-11-08: qty 2

## 2021-11-08 MED ORDER — HYDROMORPHONE HCL 1 MG/ML IJ SOLN
0.5000 mg | INTRAMUSCULAR | Status: DC | PRN
Start: 2021-11-08 — End: 2021-11-09

## 2021-11-08 MED ORDER — ROPIVACAINE HCL 5 MG/ML IJ SOLN
INTRAMUSCULAR | Status: AC
Start: 2021-11-08 — End: ?
  Filled 2021-11-08: qty 30

## 2021-11-08 MED ORDER — SUCCINYLCHOLINE CHLORIDE 20 MG/ML IJ SOLN
INTRAMUSCULAR | Status: DC | PRN
Start: 2021-11-08 — End: 2021-11-08
  Administered 2021-11-08 (×2): 120 mg via INTRAVENOUS

## 2021-11-08 MED ORDER — FENTANYL CITRATE (PF) 250 MCG/5ML IJ SOLN
INTRAMUSCULAR | Status: DC | PRN
Start: 2021-11-08 — End: 2021-11-08
  Administered 2021-11-08 (×5): 50 ug via INTRAVENOUS

## 2021-11-08 MED ORDER — TRANEXAMIC ACID 1000 MG/10ML IV SOLN
INTRAVENOUS | Status: AC
Start: 2021-11-08 — End: ?
  Filled 2021-11-08: qty 20

## 2021-11-08 MED ORDER — ALBUMIN HUMAN 5 % IV SOLN
INTRAVENOUS | Status: DC | PRN
Start: 2021-11-08 — End: 2021-11-08

## 2021-11-08 MED ORDER — PHENYLEPHRINE HCL-NACL 25-0.9 MG/250ML-% IV SOLN
INTRAVENOUS | Status: DC | PRN
Start: 2021-11-08 — End: 2021-11-08
  Administered 2021-11-08: 25 ug/min via INTRAVENOUS
  Administered 2021-11-08 (×3): 15 ug/min via INTRAVENOUS

## 2021-11-08 MED ORDER — ALBUMIN HUMAN 5 % IV SOLN
INTRAVENOUS | Status: AC
Start: 2021-11-08 — End: ?
  Filled 2021-11-08: qty 500

## 2021-11-08 MED ORDER — VANCOMYCIN HCL 1 GM IV SOLR
INTRAVENOUS | Status: AC
Start: 2021-11-08 — End: ?
  Filled 2021-11-08: qty 4000

## 2021-11-08 MED ORDER — CEFTRIAXONE SODIUM 1 GM IJ SOLR
INTRAMUSCULAR | Status: AC
Start: 2021-11-08 — End: ?
  Filled 2021-11-08: qty 1000

## 2021-11-08 MED ORDER — LIDOCAINE-EPINEPHRINE 1 %-1:100000 IJ SOLN
INTRAMUSCULAR | Status: AC
Start: 2021-11-08 — End: ?
  Filled 2021-11-08: qty 30

## 2021-11-08 MED ORDER — OXYCODONE HCL 5 MG OR TABS
5.0000 mg | ORAL_TABLET | Freq: Once | ORAL | Status: DC | PRN
Start: 2021-11-08 — End: 2021-11-09

## 2021-11-08 MED ORDER — DEXMEDETOMIDINE HCL 200 MCG/2ML IV SOLN
INTRAVENOUS | Status: DC | PRN
Start: 2021-11-08 — End: 2021-11-08
  Administered 2021-11-08 (×2): 2 ug via INTRAVENOUS
  Administered 2021-11-08: 4 ug via INTRAVENOUS

## 2021-11-08 MED ORDER — VANCOMYCIN HCL 1 GM IV SOLR
INTRAVENOUS | Status: DC | PRN
Start: 2021-11-08 — End: 2021-11-08
  Administered 2021-11-08 (×2): 2000 mg via TOPICAL
  Administered 2021-11-08: 1000 mg via TOPICAL

## 2021-11-08 MED ORDER — FENTANYL CITRATE (PF) 50 MCG/ML IJ SOLN (WRAPPED RECORD) ~~LOC~~
25.0000 ug | INTRAMUSCULAR | Status: DC | PRN
Start: 2021-11-08 — End: 2021-11-09

## 2021-11-08 MED ORDER — ROPIVACAINE HCL 5 MG/ML IJ SOLN
INTRAMUSCULAR | Status: DC | PRN
Start: 2021-11-08 — End: 2021-11-08
  Administered 2021-11-08 (×2): 20 mL

## 2021-11-08 MED ORDER — PROPOFOL 1000 MG/100ML IV EMUL
INTRAVENOUS | Status: DC | PRN
Start: 2021-11-08 — End: 2021-11-08
  Administered 2021-11-08: 120 ug/kg/min via INTRAVENOUS
  Administered 2021-11-08: 140 ug/kg/min via INTRAVENOUS
  Administered 2021-11-08: 100 ug/kg/min via INTRAVENOUS
  Administered 2021-11-08 (×2): 125 ug/kg/min via INTRAVENOUS
  Administered 2021-11-08: 14:00:00 100 ug/kg/min via INTRAVENOUS
  Administered 2021-11-08: 150 ug/kg/min via INTRAVENOUS
  Administered 2021-11-08: 100 ug/kg/min via INTRAVENOUS

## 2021-11-08 MED ORDER — ONDANSETRON HCL 4 MG/2ML IV SOLN
4.0000 mg | Freq: Once | INTRAMUSCULAR | Status: DC | PRN
Start: 2021-11-08 — End: 2021-11-09

## 2021-11-08 MED ORDER — THROMBIN (RECOMBINANT) 20000 UNIT EX SOLR
CUTANEOUS | Status: DC | PRN
Start: 2021-11-08 — End: 2021-11-08
  Administered 2021-11-08 (×2): 20000 [IU] via TOPICAL

## 2021-11-08 MED ORDER — ACETAMINOPHEN 10 MG/ML IV SOLN
1000.0000 mg | Freq: Once | INTRAVENOUS | Status: AC
Start: 2021-11-08 — End: 2021-11-08
  Administered 2021-11-08 (×2): 1000 mg via INTRAVENOUS
  Filled 2021-11-08: qty 100

## 2021-11-08 MED ORDER — LACTATED RINGERS IV SOLN
INTRAVENOUS | Status: DC | PRN
Start: 2021-11-08 — End: 2021-11-08

## 2021-11-08 MED ORDER — CEFAZOLIN SODIUM 1 GM IJ SOLR
INTRAMUSCULAR | Status: DC | PRN
Start: 2021-11-08 — End: 2021-11-08
  Administered 2021-11-08 (×4): 2000 mg via INTRAVENOUS

## 2021-11-08 MED ORDER — CALCIUM CHLORIDE 10 % IV SOLN
INTRAVENOUS | Status: DC | PRN
Start: 2021-11-08 — End: 2021-11-08
  Administered 2021-11-08 (×3): 500 mg via INTRAVENOUS

## 2021-11-08 MED ORDER — SODIUM CHLORIDE 0.9 % IV SOLN
INTRAVENOUS | Status: DC | PRN
Start: 2021-11-08 — End: 2021-11-08
  Administered 2021-11-08 (×2): 1000 mg via INTRAVENOUS

## 2021-11-08 MED ORDER — LIDOCAINE-EPINEPHRINE 1 %-1:100000 IJ SOLN
INTRAMUSCULAR | Status: DC | PRN
Start: 2021-11-08 — End: 2021-11-08
  Administered 2021-11-08 (×3): 10 mL

## 2021-11-08 MED ORDER — GLYCOPYRROLATE 1 MG/5ML IJ SOLN
INTRAMUSCULAR | Status: DC | PRN
Start: 2021-11-08 — End: 2021-11-08
  Administered 2021-11-08 (×2): .2 mg via INTRAVENOUS

## 2021-11-08 MED ORDER — KETAMINE HCL 50 MG/5ML IJ SOSY
PREFILLED_SYRINGE | INTRAMUSCULAR | Status: AC
Start: 2021-11-08 — End: ?
  Filled 2021-11-08: qty 5

## 2021-11-08 MED ORDER — LIDOCAINE HCL 2 % IJ SOLN WRAPPED RECORD
INTRAMUSCULAR | Status: DC | PRN
Start: 2021-11-08 — End: 2021-11-08
  Administered 2021-11-08 (×2): 80 mg via INTRAVENOUS

## 2021-11-08 MED ORDER — PROPOFOL 1000 MG/100ML IV EMUL
INTRAVENOUS | Status: AC
Start: 2021-11-08 — End: ?
  Filled 2021-11-08: qty 400

## 2021-11-08 MED ORDER — ONDANSETRON HCL 4 MG/2ML IV SOLN
INTRAMUSCULAR | Status: DC | PRN
Start: 2021-11-08 — End: 2021-11-08
  Administered 2021-11-08 (×3): 4 mg via INTRAVENOUS

## 2021-11-08 MED ORDER — BUPIVACAINE HCL (PF) 0.5 % IJ SOLN
INTRAMUSCULAR | Status: AC
Start: 2021-11-08 — End: ?
  Filled 2021-11-08: qty 30

## 2021-11-08 MED ORDER — HYDROMORPHONE HCL 1 MG/ML IJ SOLN
1.0000 mg | Freq: Once | INTRAMUSCULAR | Status: AC
Start: 2021-11-08 — End: 2021-11-08
  Administered 2021-11-08 (×2): 1 mg via INTRAVENOUS

## 2021-11-08 MED ORDER — SODIUM CHLORIDE 0.9 % IV SOLN
5.0000 mg/kg | Freq: Once | INTRAVENOUS | Status: DC
Start: 2021-11-08 — End: 2021-11-09
  Filled 2021-11-08: qty 8

## 2021-11-08 SURGICAL SUPPLY — 72 items
3M STERI-DRAPE 1000 (Drapes/towels) ×3 IMPLANT
BASIN SINGLE BASIC STERILE (Misc Surgical Supply) ×3 IMPLANT
BLANKET BAIR HUGGER LOWER BODY (Misc Medical Supply) ×3 IMPLANT
BLANKET BAIR HUGGER UPPER BODY (Patient Care Supply) ×3 IMPLANT
BRUSH SRGN SCRUB CHG 4% (Prep Solutions) ×3 IMPLANT
CAUTERY TIP EDGE BLADE ELECTRODE 6.5", INSULATING (Cautery) ×3 IMPLANT
CONTAINER PRECISION SPECIMEN, 4OZ- STERILE (Misc Medical Supply) ×9 IMPLANT
CORD VALLEYLAB BIPOLAR FORCEP- SINGE USE (Cautery) ×6 IMPLANT
COUNTER NEEDLE SHARPS DISP (Needles/punch/cannula/biopsy) ×3
DBM PUTTY EVO3 ACELL 10ML (Bone/chips/putty) ×3 IMPLANT
DERMABOND ADVANCE 0.7ML (Dressings/packing) ×3 IMPLANT
DISSECTOR 3 ENDOPATH BLUNT TIP 5MM X 40.5CM (Lap/Endo/Arthroscopy) ×3 IMPLANT
DRAIN RELIAVAC FLAT 10MM X 20CM 0070440 (Lines/Drains) ×3 IMPLANT
DRAIN SUCTION EVAC RELIAVAC 100CC (Lines/Drains) ×3
DRAPE C-ARMOR FLUOROSCAN IMAGING (Drapes/towels) ×3 IMPLANT
DRESSING MEPILEX BORDER LITE 2 X 5 IN (Dressings/packing) IMPLANT
DRESSING MEPILEX BORDER SILICON 4X4 (Dressings/packing) ×30 IMPLANT
DRESSING MEPILEX BORDER SILICONE 4" X 10" (Dressings/packing) ×3 IMPLANT
DRESSING TEGADERM 8" X 12" (Dressings/packing) ×3 IMPLANT
DRILL BIT STD SST TWIST 2 MM X 127MM (Drills/Bits/Burs/Taps/Reamers) ×12
DRILL PRECISION NEURO 3.0 X 3.8MM (Drills/Bits/Burs/Taps/Reamers) ×6 IMPLANT
FORCEP BIPOLAR 8 1/2 1.0MM TIP SINGLE USE (Cautery) ×6 IMPLANT
GLOVE BIOGEL INDICATOR UNDERGLOVE SIZE 8.5 (Gloves/Gowns) ×12 IMPLANT
GLOVE BIOGEL PI INDICATOR SIZE 7.5 (Gloves/Gowns) ×24 IMPLANT
GLOVE SURGICAL BIOGEL SIZE 8.5 (Gloves/Gowns) ×12 IMPLANT
GOWN SIRUS XLG BLUE, AAMI LVL 4 (Gloves/Gowns) ×15 IMPLANT
GOWN SURGICAL ULTRA XL BLUE, AAMI LVL 3 (Gloves/Gowns) ×9 IMPLANT
GOWN SURGICAL XXLG BLUE (Gloves/Gowns) ×6
HEADREST PRONE POSITION (Patient Care Supply) ×3 IMPLANT
HEMOSTATIC MATRIX SURGIFLO W/O THROMBIN 2991 (Hemostatic agents/wax/sealants-absorbable) ×9
JMC ONLY- O.R. CAMERA COVER LIGHT, STERILE (Misc Surgical Supply) ×3 IMPLANT
KIT DILATOR NEUROVISION NVM5 XLIF, DISP (Procedure Packs/kits) ×3 IMPLANT
KIT INFUSE BONE GRAFT SMALL (Grafts) ×3 IMPLANT
KIT PT CARE JACKSON TABLE (Patient Care Supply) ×3 IMPLANT
MILL BONE STRYKER MEDIUM, DISP (Misc Surgical Supply) ×3 IMPLANT
MODULE MAXCESS 4 (Procedure Packs/kits) ×3 IMPLANT
PACK SPINAL SURGERY (Procedure Packs/kits) ×3 IMPLANT
PAD ARMBOARD CONV FOAM 2X8X20 (Patient Care Supply) ×3 IMPLANT
PAD GROUND VALLEYLAB REM ADULT E7507 (Cautery) ×6 IMPLANT
PROBE NERVE STIMULATOR PRASS W/PROTECTED PIN OD 0.5MM MONOPOLAR (Misc Surgical Supply) ×3 IMPLANT
PROTECTOR ULNAR NERVE PAD, YELLOW (Patient Care Supply) ×3
PUTTY MAGNETOS 10CC 1-2MM (Bone/chips/putty) ×3 IMPLANT
PUTTY STRATOFUSE FIBER 5CC (Bone/chips/putty) ×3 IMPLANT
ROD SPINE EXPEDIUM 5.5 X 120MM PRE-BENT (Spine cages/spacers/discs/fixation) ×6 IMPLANT
SCREW SET EXPEDIUM 5.5 SINGLE INNER (Screws/anchors/cables) ×30 IMPLANT
SCREW VIPER FENESTRATED CORTICAL 6 X 55MM (Screws/anchors/cables) ×6 IMPLANT
SEALER AQUAMANTYS 2.3 BIPOLAR (Cautery) ×3 IMPLANT
SKIN CLOSURE SYSTEM PRINEO 22CM (Suture) ×3 IMPLANT
SOLUTION IRR POUR BTL 0.9% NS 1000ML (Non-Pharmacy Meds/Solutions) ×6 IMPLANT
SOLUTION IRR POUR BTL H20 1000ML (Non-Pharmacy Meds/Solutions) ×3 IMPLANT
SPONGE LAP RF DETECT 18" X 18" XRAY STERILE (Sponges) ×3 IMPLANT
SPONGE LAP RF DETECT 4" X 18" XRAY STERILE (5 PIECES) (Sponges) ×6 IMPLANT
SPONGE SURGIFOAM LARGE (GELFOAM) (Hemostatic agents/wax/sealants-absorbable) ×3 IMPLANT
STRAND ISOTIS MEDIUM 5CC (Bone/chips/putty) ×3 IMPLANT
STRAND ISOTIS SMALL 2.5CC (Bone/chips/putty) ×3 IMPLANT
STRAP POSITIONING KNEE/BODY, HOOK AND LOOP (Patient Care Supply) ×3 IMPLANT
SURGICAL PACK SPINAL STAGING (Procedure Packs/kits) ×3 IMPLANT
SUTURE 3-0 MONOCRYL PLUS 3-0 (Suture) ×3 IMPLANT
SUTURE ETHILON 2-0 18" FS (Suture) ×12 IMPLANT
SUTURE ETHILON 2-0 30" FSLX (Suture) ×9
SUTURE VICRYL PLUS 0 18" CT-1 CR VCP840D (Suture) ×3 IMPLANT
SUTURE VICRYL PLUS 0 18" MO-4 CR VCP701D (Suture) ×9 IMPLANT
SUTURE VICRYL PLUS 0 27" UR6 (VCP603) (Suture) ×6
SUTURE VICRYL PLUS 0 27" UR6 VCP603 (Suture) ×4 IMPLANT
SUTURE VICRYL PLUS 1 18" MO-4 CR VCP702D (Suture) ×9
SUTURE VICRYL PLUS 2-0 18" CT-1 (Suture) ×9
SUTURE VICRYL PLUS 2-0 18" CT-1 VCP839D (Suture) ×6 IMPLANT
SUTURE VICRYL PLUS 2-0 27" CT-2 VCP269 (Suture) ×9 IMPLANT
SYSTEM MAXVIEW LATERAL X-SHORT (Kits/Sets/Trays) ×3 IMPLANT
TOWELS OR BLUE 4-PACK STERILE, DISPOSABLE (Drapes/towels) ×12 IMPLANT
WAVEFORM-L IMPLANT 23 X 10 X 55MM 15 DEG (Spine cages/spacers/discs/fixation) ×3 IMPLANT
YANKAUER BULB TIP ON/OFF CONTROL STERILE (Tubing/Suction) ×3 IMPLANT

## 2021-11-08 NOTE — Plan of Care (Signed)
Problem: Promotion of Health and Safety  Goal: Promotion of Health and Safety  Description: The patient remains safe, receives appropriate treatment and achieves optimal outcomes (physically, psychosocially, and spiritually) within the limitations of the disease process by discharge.    Information below is the current care plan.  Flowsheets (Taken 11/07/2021 1940)  Individualized Interventions/Recommendations #1:   Fall precaution   Upper bed rails raised with bed kept in its lowest and locked position. Call bell secured in bed within patient's reach. Advised to call whenever help or assistance is needed. Personal cane at bedside.  Individualized Interventions/Recommendations #2 (if applicable):   Assess for pain   Rates pain at 6/10 to his lower back. PCA in place and is aware to press the PCA button for pain control. Will continue to monitor.  Individualized Interventions/Recommendations #3 (if applicable): Re-emphasized instructions to be NPO post midnight tonight for surgery tomorrow. Pt is aware.  Individualized Interventions/Recommendations #4 (if applicable): Cluster nursing care and interventions to promote rest and sleep.  Note:

## 2021-11-08 NOTE — Progress Notes (Cosign Needed)
[facility] Progress Note  [place]  Current Length of Stay:   4 days - Admitted on: 11/04/2021    Surgery:  09/06/19 XLIF L3-L5, PSF W2-O3 ([facility])  7/85/88 ALIF F0-Y7, revision PSF X4-J2 (Zlomislic)    Subjective:  No acute events overnight  Pain controlled  OR today  NPO    Objective:  Vital Signs:  BP 109/65 (BP Location: Right arm, BP Patient Position: Sitting)   Pulse 66   Temp 98.1 F (36.7 C)   Resp 16   Ht 5\' 6"  (1.676 m)   Wt 88.8 kg (195 lb 12.3 oz)   SpO2 97%   BMI 31.60 kg/m     Physical Exam:  General: patient awake, alert, and responding to commands; no apparent distress  HEENT: hearing and vision grossly intact  Cardio: regular rate and rhythm per peripheral pulses  Respiratory: patient breathing quietly without use of accessory muscles    Spine Exam:    5/5 upper extremity grip strength w/ negative hoffman's    Lower Extremity Motor Strength  Muscle Left/5 Right/5 Comment   Hip flexion (L2) 5 5    Knee extension (L3) 5 5    Tibialis anterior (L4) 5 5    Extensor hallucis (L5) 5 5    Gastrocsoleus (S1) 5 5      Lower Extremity Sensation to Light Touch  Nerve Left Right Comment   L2 Intact Intact    L3 Intact Intact    L4 Intact Intact    L5 Intact Intact    S1 Intact Intact      Lower Extremity Special Testing                                                                  Left                              Right  Clonus                                     Positive                       Positive  Patellar Reflex:                       3+                                3+  Vascular:                                 2+ DP/PT                    2+ DP/PT      Laboratory Data:   Lab Results   Component Value Date    WBC 11.8 (H) 11/05/2021    HGB 13.9 11/05/2021    HCT 40.0 11/05/2021    PLT 163 11/05/2021  Input/Output:    Intake/Output Summary (Last 24 hours) at 11/05/2021 0653  Last data filed at 11/05/2021 0547  Gross per 24 hour   Intake 240 ml   Output  1650 ml   Net -1410 ml       Current Medications:  Current Facility-Administered Medications   Medication   . acetaminophen (TYLENOL) tablet 975 mg   . allopurinol (ZYLOPRIM) tablet 300 mg   . amLODIPINE (NORVASC) tablet 10 mg   . DULoxetine (CYMBALTA) CR capsule 20 mg   . famotidine (PEPCID) tablet 20 mg   . HYDROmorphone (DILAUDID) 0.2 mg/mL PCA syringe   . lidocaine (ASPERCREME) 4 % patch 1 patch   . LORazepam (ATIVAN) tablet 1 mg   . pregabalin (LYRICA) capsule 100 mg   . 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, and recent admission for spinal stenosis (11/11) who presents with continued lower back pain and leg pain      Plan/Recs:  - admit to ortho spine for pain control   - OR today 1 level XLIF and revision L1- Pelvis   - multimodal pain regimen  - all questions and concerns addressed at bedside       Please page the Orthopaedic Spine Surgery Team with questions or concerns based on patient location:     Established spine floor patients at [place]: Ortho Spine 1 - [phone]      Established spine floor patients at [facility]: Ortho Spine 2 - [phone]      New spine consultations not yet followed by spine team:    View [facility] on call for "SPINE CONSULT" call schedule (Orthopaedic Spine versus Neurosurgery)   If Orthopaedic Spine is on call please page the [place] on call:   [place]: ORTHOPEDICS/TH   [place]: ORTHOPEDICS/HC

## 2021-11-08 NOTE — Brief Op Note (Signed)
BRIEF OPERATIVE NOTE    DATE: 11/08/2021  TIME: 8:54 PM    PREOPERATIVE DIAGNOSIS:   1. Degenerative spondylolisthesis  2. 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 S/p remote lumbar fusion with ASD  4. S/p stage 1 XLIF L2-3    POSTOPERATIVE DIAGNOSIS:   Same    PROCEDURE:   1. ROI L3 thru sacrum consisting of rods (Depuy synthes)  2. Exploration of fusion L3 thru sacrum (intact)  3. Revision posterior spinal instrumentation and fusion L2 thru sacrum consisting of screws and rods (Depuy Synthes), local bone autograft, DBM, beta tricalcium phosphate (Magneots)  4. Revision lamy L4, L3, partial L2 lamy with bilateral facetectomies and foraminotomies L2-3, L3-4  5. Decompression of cauda equina and exploration of nerve roots bilateral L4, L3, L2  6. Interpretation of intraoperative fluoroscopy  7. Interpretation of intraoperative neuromonitoring    ATTENDING SURGEON: Cynithia Hakimi  ASSISTANTS(s): Lavell Anchors    ANESTHESIA: GETA    FINDINGS: stenosis    WOUND CLASSIFICATION:Class I (clean)    WOUND CLOSURE STATUS:All layers of surgical incision (deep and superficial) were fully closed.    SPECIMENS: rods to path     Fluids/Blood Products:      IV Fluids: 2L LR    Blood Products: None    EBL: 400cc    Urine Output: 546TK    COMPLICATIONS: None    DISPOSITION: Stable to PACU

## 2021-11-08 NOTE — Plan of Care (Signed)
Problem: Promotion of Health and Safety  Goal: Promotion of Health and Safety  Description: The patient remains safe, receives appropriate treatment and achieves optimal outcomes (physically, psychosocially, and spiritually) within the limitations of the disease process by discharge.    Information below is the current care plan.  Outcome: Progressing  Flowsheets  Taken 11/08/2021 1051 by Alfonso Ramus, RN  Individualized Interventions/Recommendations #1: Assess for pain, respiratory depression, and mentation. PCA running.  Individualized Interventions/Recommendations #2 (if applicable): Complete CHG and preop checklist  Outcome Evaluation (rationale for progressing/not progressing) every shift: Alert and oriented x4. Neurovascular check intact, however, pt having shooting pain down right leg. Pain 10/10 today b/c patient stating he "tweaked" his back when walking overnight. Good output via foley catheter. No substantial BM in a week per pt. Ambulating with assistive device. NPO for surgery today. Still in OR.   Taken 11/08/2021 0719 by Alfonso Ramus, RN  Patient /Family stated Goal: pain control and get surgery done  Taken 11/07/2021 1940 by Merril Abbe, RN  Individualized Interventions/Recommendations #4 (if applicable): Cluster nursing care and interventions to promote rest and sleep.  Note:

## 2021-11-08 NOTE — Progress Notes (Addendum)
ORTHOPAEDIC SPINE SURGERY     Visit Type: Office Visit    Reason For Visit: No chief complaint on file.       History Of Present Illness: 70 year old male returns to spine clinic for follow up evaluation of complaints of low back pain. He states that he has worsening low back pain with intermittent radiating leg pain. Patient is seen today by video telehealth as a result of covid pandemic.  He has pain with activities relieved with rest.  Patient rates his pain as  . Patient reports no changes in bowel and bladder function. Patient returns to discuss surgery.    Allergies:   Allergies   Allergen Reactions   . Amoxicillin Rash   . Sulfa Drugs Unspecified       Medications:   No current facility-administered medications for this visit.     No current outpatient medications on file.     Facility-Administered Medications Ordered in Other Visits   Medication   . [MAR Hold] acetaminophen (TYLENOL) tablet 975 mg   . [MAR Hold] allopurinol (ZYLOPRIM) tablet 300 mg   . [MAR Hold] amLODIPINE (NORVASC) tablet 10 mg   . diphenhydrAMINE (BENADRYL) injection 12.5 mg   . [MAR Hold] DULoxetine (CYMBALTA) CR capsule 20 mg   . [MAR Hold] famotidine (PEPCID) tablet 20 mg   . fentaNYL injection 25 mcg   . fentaNYL injection 50 mcg   . gentamicin 320 mg in sodium chloride 0.9 % 100 mL IVPB   . HYDROmorphone (DILAUDID) 0.2 mg/mL PCA syringe   . HYDROmorphone (DILAUDID) 1 MG/ML injection  - ADS OVERRIDE   . HYDROmorphone (DILAUDID) injection 0.5 mg   . lactated ringers infusion   . [MAR Hold] lidocaine (ASPERCREME) 4 % patch 1 patch   . [MAR Hold] LORazepam (ATIVAN) tablet 1 mg   . nalOXone (NARCAN) injection 0.1 mg   . ondansetron (ZOFRAN) injection 4 mg   . oxyCODONE (ROXICODONE) tablet 5 mg   . [MAR Hold] pregabalin (LYRICA) capsule 100 mg   . [MAR Hold] senna (SENOKOT) tablet 8.6 mg         Review of Systems:  CONSTITUTIONAL: No unexpected weight loss, fevers, chills, or anorexia. SKIN: Noncontributory. GENITOURINARY: Symptoms as  noted above in HPI. NEUROLOGIC: Symptoms as noted above in HPI. MUSCULOSKELETAL: Symptoms as noted above in HPI.    Physical Examination:  There were no vitals taken for this visit.  CONSTITUTIONAL: Well appearing and well groomed. PSYCHOLOGICAL: Alert and oriented and appropriate to situation. INTEGUMENT: Intact. VASCULAR: Radial and pedal pulses are intact and symmetric. EXTREMITIES: Bilateral upper and lower extremities have good range of motion and no significant deformities. GAIT: Brisk with good coordination.     CERVICAL SPINE: Nontender to palpation. Spurling's test negative. Sensation intact of the upper extremities.    RANGE OF MOTION (in degrees)       RT LT  Lateral motion   80  80  Lateral bend   45  45  Flexion 40 degrees. Extension 35 degrees. Motion in all planes is nonpainful.    MOTOR    RT LT  Trapezius   5/5  5/5  Deltoid    5/5  5/5  Bicep    5/5  5/5  Tricep    5/5  5/5  Wrist flexor   5/5  5/5  Wrist extensor  5/5  5/5  Intrinsics   5/5  5/5  Grip    5/5  5/5    DEEP TENDON  REFLEXES       RT  LT  Bicep    2+  2+  Brachioradialis  2+  2+  Tricep    2+  2+    THORACIC SPINE  Nontender to percussion. Alignment - normal, no paravertebral muscle fullness. Sensation intact. Beevor's negative. Abdominal reflexes are absent and symmetric.     LUMBAR SPINE  Tender to percussion. Heel to toe walk without deficit. Sensation intact of the lower extremities. Trendelenburg sign positive (on left). Skin intact.    RANGE OF MOTION        RT  LT  lateral bend, normal at 25 degrees 25  25  rotation, normal at 30 degrees 30  30  Flexion - normal at 40 degrees. Extension - normal at 10 degrees.    MOTOR STRENGTH       RT  LT  Iliopsoas   5/5  5/5  Quadricep   5/5  5/5  Anterior tibialis   5/5  5/5  Extensor hallucis longus 5/5  5/5  Gastrocsoleus  5/5  5/5    DEEP TENDON REFLEXES       RT  LT  Patellar   2+  2+  Achilles   2+  2+    PATHOLOGIC  REFLEXES      RT LT  Clonus    Neg Neg  Babinski   Neg Neg  Hoffmans   Neg Neg    STRAIGHT LEG RAISING       RT  LT  Sitting @ 90 degrees   Neg Neg    IMAGING STUDIES:   Plain radiographs lumbar spine shows:  IMPRESSION:  No acute findings.        OTHER MEDICAL RECORDS/IMAGING STUDIES REVIEWED: None    IMPRESSION:   Encounter Diagnoses   Name Primary?   . Degenerative spondylolisthesis Yes   . Spinal stenosis of lumbar region with radiculopathy         PLAN/DISCUSSION: Reviewed clinical history as well as exam results and imaging findings with patient and family in detail. Imaging shows evidence of ddd and spondylosis with stenosis and radiculopathy which may correlate with distribution and progression of symptoms. We have discussed plan for surgery if indicated which would involve revision XLIF L2 thru L3 followed by lumbar decompression and fusion L2 thru sacrum. Will order MRI lumbar spine for further evaluation and referral to pain management in the interim.  All questions were answered and Stephen [name] understood and was satisfied with this plan.       FOLLOWUP: Return to clinic: six weeks. The patient is encouraged to call us with any questions or problems in the interim. Our contact numbers were given to the patient.

## 2021-11-08 NOTE — Plan of Care (Signed)
Problem: Promotion of Perioperative Health and Safety  Goal: Promotion of Health and Safety of the Perioperative Patient  Description: The patient remains safe, receives treatment appropriate to the surgical intervention and patient's physiological needs and is discharged or transferred to the appropriate level of care.    Information below is the current care plan.  Outcome: Resolved  Flowsheets (Taken 11/08/2021 2244)  Guidelines: PACU  Individualized Interventions/Recommendations #1: anticipate needs  Individualized Interventions/Recommendations #2 (if applicable): provide warm blankets prn

## 2021-11-08 NOTE — H&P (Signed)
[facility] Progress Note  [place]  Current Length of Stay:   4 days - Admitted on: 11/04/2021    Surgery:  09/06/19 XLIF L3-L5, PSF A2-Z3 (Zlomislic)  0/86/57 ALIF Q4-O9, revision PSF G2-X5 (Zlomislic)    Subjective:  No acute events overnight  Pain controlled  OR today  NPO    Objective:  Vital Signs:  BP 142/72 (BP Location: Left arm)   Pulse 68   Temp 98.2 F (36.8 C)   Resp 16   Ht 5\' 6"  (1.676 m)   Wt 91.2 kg (201 lb 1 oz)   SpO2 100%   BMI 32.45 kg/m     Physical Exam:  General: patient awake, alert, and responding to commands; no apparent distress  HEENT: hearing and vision grossly intact  Cardio: regular rate and rhythm per peripheral pulses  Respiratory: patient breathing quietly without use of accessory muscles    Spine Exam:    5/5 upper extremity grip strength w/ negative hoffman's    Lower Extremity Motor Strength  Muscle Left/5 Right/5 Comment   Hip flexion (L2) 5 5    Knee extension (L3) 5 5    Tibialis anterior (L4) 5 5    Extensor hallucis (L5) 5 5    Gastrocsoleus (S1) 5 5      Lower Extremity Sensation to Light Touch  Nerve Left Right Comment   L2 Intact Intact    L3 Intact Intact    L4 Intact Intact    L5 Intact Intact    S1 Intact Intact      Lower Extremity Special Testing                                                                  Left                              Right  Clonus                                     Positive                       Positive  Patellar Reflex:                       3+                                3+  Vascular:                                 2+ DP/PT                    2+ DP/PT      Laboratory Data:   Lab Results   Component Value Date    WBC 11.8 (H) 11/05/2021    HGB 13.9 11/05/2021    HCT 40.0 11/05/2021    PLT 163 11/05/2021       Input/Output:  Intake/Output Summary (Last 24 hours) at 11/05/2021 0653  Last data filed at 11/05/2021 0547  Gross per 24 hour   Intake 240 ml   Output 1650 ml   Net -1410 ml        Current Medications:  Current Facility-Administered Medications   Medication   . [MAR Hold] acetaminophen (TYLENOL) tablet 975 mg   . [MAR Hold] allopurinol (ZYLOPRIM) tablet 300 mg   . [MAR Hold] amLODIPINE (NORVASC) tablet 10 mg   . [MAR Hold] DULoxetine (CYMBALTA) CR capsule 20 mg   . [MAR Hold] famotidine (PEPCID) tablet 20 mg   . HYDROmorphone (DILAUDID) 0.2 mg/mL PCA syringe   . [MAR Hold] lidocaine (ASPERCREME) 4 % patch 1 patch   . [MAR Hold] LORazepam (ATIVAN) tablet 1 mg   . [MAR Hold] pregabalin (LYRICA) capsule 100 mg   . 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, and recent admission for spinal stenosis (11/11) who presents with continued lower back pain and leg pain      Plan/Recs:  - admit to ortho spine for pain control   - OR today 1 level XLIF L2-L3 and revision T10- Pelvis   - multimodal pain regimen  - all questions and concerns addressed at bedside       Please page the Orthopaedic Spine Surgery Team with questions or concerns based on patient location:     Established spine floor patients at [place]: Ortho Spine 1 - [phone]      Established spine floor patients at [facility]: Ortho Spine 2 - [phone]      New spine consultations not yet followed by spine team:    View [facility] on call for "SPINE CONSULT" call schedule (Orthopaedic Spine versus Neurosurgery)   If Orthopaedic Spine is on call please page the [facility] on call:   : ORTHOPEDICS/TH   [facility]: ORTHOPEDICS/HC

## 2021-11-08 NOTE — Plan of Care (Signed)
Pt to be transferred from [place] to 3W after PACU. Belongings bagged, patient labels placed, and at [place] while waiting for assigned bed on 3W. PCA Dilaudid that was disconnected prior to OR was wasted with 2nd RN witness and documented in pyxis.

## 2021-11-08 NOTE — Anesthesia Postprocedure Evaluation (Signed)
Anesthesia Post Note    Patient: Stephen [name]    Procedure(s) Performed: Procedure(s):  Revision lumbar spine, posterior, lumbar Lumbar 2-Sacrum  FUSION, SPINE, LUMBAR, XLIF, 1 LEVEL      Final anesthesia type: General    Patient location: PACU    Post anesthesia pain: adequate analgesia    Mental status: awake, alert  and oriented    Airway Patent: Yes    Last Vitals:   Vitals Value Taken Time   BP 121/67 11/08/21 2245   Temp 36.1 C 11/08/21 2220   Pulse 74 11/08/21 2253   Resp 13 11/08/21 2253   SpO2 97 % 11/08/21 2253   Vitals shown include unvalidated device data.     Post vital signs: stable    Hydration: adequate    N/V:no    Anesthetic complications: no    Plan of care per primary team.

## 2021-11-08 NOTE — Anesthesia Procedure Notes (Addendum)
Arterial Line Procedure Note  Procedure: Arterial Line Insertion Date & Time: 11/08/2021 1:52 PM   Preparation: Patient was prepped and draped in usual sterile fashion  Indications:hemodynamic monitoring  Location: left radial Universal Protocol: Verbal consent obtained, risks and benefits discussed, patient states understanding of the procedure being performed, the patient's understanding of the procedure matches consent given, procedure consent matches procedure scheduled, relevant documents present and verified, test results available and properly labeled, site marked, imaging studies available, required blood products, implants, devices, and special equipment available and Immediately prior to procedure a time out was called to verify the correct patient, procedure, equipment, support staff and site/side marked as required    Consent given by: patient  Patient identity confirmed by: verbally with patient and arm band   Anesthesia: Anesthesia method: under general anesthesia.    Sedation: patient sedated   Procedure Details:     Needle Gauge: 20        No Ultrasound Guidance Utilized    Post Procedure:   dressing applied    patient tolerated the procedure well with no immediate complications Comments:

## 2021-11-08 NOTE — Brief Op Note (Signed)
BRIEF OPERATIVE NOTE    DATE: 11/08/2021  TIME: 3:51 PM    PREOPERATIVE DIAGNOSIS:   1. Degenerative spondylolisthesis  2. 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 S/p remote lumbar fusion with ASD    POSTOPERATIVE DIAGNOSIS:   Same    PROCEDURE:   1. Far lateral interbody fusion L2-3 with titanium cage (Seaspine), DBM, rhBMP-2  2. Placement of biomechanical vertebral structural device consisting of titanium cage  3. Use of intraoperative microscope (viseon)  4. Interpretation of intraoperative flurosocopy  5. Interpretation of intraoperative neuromonitoring (Nuva+evokes)    ATTENDING SURGEON: Gwynneth Fabio  ASSISTANTS(s): Lovena Le    ANESTHESIA: GETA    FINDINGS: spondy, ddd    WOUND CLASSIFICATION:Class I (clean)    WOUND CLOSURE STATUS:All layers of surgical incision (deep and superficial) were fully closed.    SPECIMENS: None    Fluids/Blood Products:      IV Fluids: 1L LR    Blood Products: none    EBL: 5cc    Urine Output: 89QJ    COMPLICATIONS: None    DISPOSITION: Stable to stage 2

## 2021-11-09 ENCOUNTER — Inpatient Hospital Stay (HOSPITAL_BASED_OUTPATIENT_CLINIC_OR_DEPARTMENT_OTHER): Payer: Medicare Other

## 2021-11-09 ENCOUNTER — Ambulatory Visit (HOSPITAL_BASED_OUTPATIENT_CLINIC_OR_DEPARTMENT_OTHER): Payer: Self-pay

## 2021-11-09 DIAGNOSIS — Z9889 Other specified postprocedural states: Secondary | ICD-10-CM

## 2021-11-09 DIAGNOSIS — Z981 Arthrodesis status: Secondary | ICD-10-CM

## 2021-11-09 LAB — PREPARE/CROSSMATCH PRBCS
Barcoded ABO/RH: 6200
Barcoded ABO/RH: 6200
Barcoded ABO/RH: 6200
Barcoded ABO/RH: 6200
Expiration: 202211252359
Expiration: 202211252359
Expiration: 202211252359
Expiration: 202211252359
Type: A POS
Type: A POS
Type: A POS
Type: A POS

## 2021-11-09 LAB — COMPREHENSIVE METABOLIC PANEL, BLOOD
ALT (SGPT): 17 U/L (ref 0–41)
AST (SGOT): 23 U/L (ref 0–40)
Albumin: 3.9 g/dL (ref 3.5–5.2)
Alkaline Phos: 64 U/L (ref 40–129)
Anion Gap: 12 mmol/L (ref 7–15)
BUN: 18 mg/dL (ref 8–23)
Bicarbonate: 25 mmol/L (ref 22–29)
Bilirubin, Tot: 0.79 mg/dL (ref <1.2)
Calcium: 8.8 mg/dL (ref 8.5–10.6)
Chloride: 100 mmol/L (ref 98–107)
Creatinine: 1.1 mg/dL (ref 0.67–1.17)
Glucose: 206 mg/dL — ABNORMAL HIGH (ref 70–99)
Potassium: 4.8 mmol/L (ref 3.5–5.1)
Sodium: 137 mmol/L (ref 136–145)
Total Protein: 6 g/dL (ref 6.0–8.0)
eGFR Based on CKD-EPI 2021 Equation: >60 mL/min

## 2021-11-09 LAB — CBC WITH DIFF, BLOOD
ANC-Automated: 16.2 10*3/uL — ABNORMAL HIGH (ref 1.6–7.0)
Abs Basophils: 0 10*3/uL (ref <0.2)
Abs Eosinophils: 0 10*3/uL (ref 0.0–0.5)
Abs Lymphs: 0.7 10*3/uL — ABNORMAL LOW (ref 0.8–3.1)
Abs Monos: 0.6 10*3/uL (ref 0.2–0.8)
Basophils: 0 %
Eosinophils: 0 %
Hct: 34.1 % — ABNORMAL LOW (ref 40.0–50.0)
Hgb: 11.9 gm/dL — ABNORMAL LOW (ref 13.7–17.5)
Imm Gran %: 1 % — ABNORMAL HIGH (ref <1)
Imm Gran Abs: 0.2 10*3/uL (ref <0.1)
Lymphocytes: 4 %
MCH: 30.9 pg (ref 26.0–32.0)
MCHC: 34.9 g/dL (ref 32.0–36.0)
MCV: 88.6 um3 (ref 79.0–95.0)
MPV: 10.5 fL (ref 9.4–12.4)
Monocytes: 4 %
Plt Count: 162 10*3/uL (ref 140–370)
RBC: 3.85 10*6/uL — ABNORMAL LOW (ref 4.60–6.10)
RDW: 13.3 % (ref 12.0–14.0)
Segs: 91 %
WBC: 17.7 10*3/uL — ABNORMAL HIGH (ref 4.0–10.0)

## 2021-11-09 MED ORDER — OXYCODONE HCL 5 MG OR TABS
5.0000 mg | ORAL_TABLET | ORAL | Status: DC | PRN
Start: 2021-11-09 — End: 2021-11-12
  Administered 2021-11-09 (×2): 5 mg via ORAL
  Filled 2021-11-09: qty 1

## 2021-11-09 MED ORDER — MAGNESIUM HYDROXIDE 400 MG/5ML OR SUSP
30.0000 mL | Freq: Two times a day (BID) | ORAL | Status: DC | PRN
Start: 2021-11-09 — End: 2021-11-12
  Administered 2021-11-11 (×2): 30 mL via ORAL
  Filled 2021-11-09: qty 30

## 2021-11-09 MED ORDER — OXYCODONE HCL 10 MG OR TABS
10.0000 mg | ORAL_TABLET | ORAL | Status: DC | PRN
Start: 2021-11-09 — End: 2021-11-12
  Administered 2021-11-09 – 2021-11-12 (×13): 10 mg via ORAL
  Filled 2021-11-09 (×12): qty 1

## 2021-11-09 MED ORDER — SENNA 8.6 MG OR TABS
2.0000 | ORAL_TABLET | Freq: Every day | ORAL | Status: DC
Start: 2021-11-09 — End: 2021-11-12
  Administered 2021-11-10 – 2021-11-11 (×3): 17.2 mg via ORAL
  Filled 2021-11-09 (×3): qty 2

## 2021-11-09 MED ORDER — HYDROMORPHONE HCL 1 MG/ML IJ SOLN
0.5000 mg | INTRAMUSCULAR | Status: AC | PRN
Start: 2021-11-09 — End: 2021-11-10

## 2021-11-09 MED ORDER — OXYCODONE HCL 5 MG OR TABS
5.0000 mg | ORAL_TABLET | ORAL | Status: DC | PRN
Start: 2021-11-09 — End: 2021-11-12
  Administered 2021-11-09 – 2021-11-10 (×3): 5 mg via ORAL
  Filled 2021-11-09 (×2): qty 1

## 2021-11-09 MED ORDER — CEFAZOLIN SODIUM-DEXTROSE 2-4/5 GM/100ML-% IV SOLN WRAPPER
2000.0000 mg | Freq: Three times a day (TID) | INTRAVENOUS | Status: DC
Start: 2021-11-09 — End: 2021-11-12
  Administered 2021-11-09 – 2021-11-12 (×10): 2000 mg via INTRAVENOUS
  Filled 2021-11-09 (×9): qty 100

## 2021-11-09 MED ORDER — BISACODYL 10 MG RE SUPP
10.0000 mg | RECTAL | Status: DC
Start: 2021-11-11 — End: 2021-11-12

## 2021-11-09 MED ORDER — CEFAZOLIN SODIUM-DEXTROSE 2-4/5 GM/100ML-% IV SOLN WRAPPER
2000.0000 mg | Freq: Three times a day (TID) | INTRAVENOUS | Status: DC
Start: 2021-11-09 — End: 2021-11-09
  Administered 2021-11-09 (×2): 2000 mg via INTRAVENOUS
  Filled 2021-11-09: qty 100

## 2021-11-09 MED ORDER — ONDANSETRON HCL 4 MG/2ML IV SOLN
4.0000 mg | Freq: Four times a day (QID) | INTRAMUSCULAR | Status: DC | PRN
Start: 2021-11-09 — End: 2021-11-12

## 2021-11-09 MED ORDER — ACETAMINOPHEN 325 MG PO TABS
975.0000 mg | ORAL_TABLET | Freq: Three times a day (TID) | ORAL | Status: DC
Start: 2021-11-09 — End: 2021-11-09

## 2021-11-09 MED ORDER — LORAZEPAM 1 MG OR TABS
1.0000 mg | ORAL_TABLET | Freq: Four times a day (QID) | ORAL | Status: DC | PRN
Start: 2021-11-09 — End: 2021-11-12

## 2021-11-09 MED ORDER — BISACODYL 10 MG RE SUPP
10.0000 mg | Freq: Every evening | RECTAL | Status: AC
Start: 2021-11-09 — End: 2021-11-11

## 2021-11-09 MED ORDER — DOCUSATE SODIUM 100 MG OR CAPS
100.0000 mg | ORAL_CAPSULE | Freq: Three times a day (TID) | ORAL | Status: DC
Start: 2021-11-09 — End: 2021-11-12
  Administered 2021-11-09 – 2021-11-11 (×7): 100 mg via ORAL
  Filled 2021-11-09 (×7): qty 1

## 2021-11-09 MED ORDER — DEXAMETHASONE SODIUM PHOSPHATE 4 MG/ML IJ SOLN (CUSTOM)
4.0000 mg | Freq: Four times a day (QID) | INTRAMUSCULAR | Status: AC
Start: 2021-11-09 — End: 2021-11-09
  Administered 2021-11-09 (×5): 4 mg via INTRAVENOUS
  Filled 2021-11-09 (×4): qty 1

## 2021-11-09 MED ORDER — POLYETHYLENE GLYCOL 3350 OR PACK
17.0000 g | PACK | Freq: Every day | ORAL | Status: DC
Start: 2021-11-09 — End: 2021-11-12
  Administered 2021-11-09 – 2021-11-11 (×4): 17 g via ORAL
  Filled 2021-11-09 (×3): qty 1

## 2021-11-09 MED ORDER — MORPHINE SULFATE 2 MG/ML IJ SOLN
2.0000 mg | INTRAMUSCULAR | Status: DC | PRN
Start: 2021-11-09 — End: 2021-11-09

## 2021-11-09 NOTE — Op Note (Signed)
[facility] - Procedure Report  -  Patient Name: Stephen [name], Stephen [name]  Date of Birth: 1951/03/06  Medical Record Number: [id number]  Pre/Post-Operative Diagnosis: Severe lumbar spinal stenosis, spondylolisthesis, degenerative disc disease, and radiculopathy  Date of Procedure: 11/08/21  Begin/End Record Time: 1443 to 1540  Surgeon(s): ZLOMISLIC  Procedure: Stage 1 of 2/lateral approach - Lumbar lateral interbody fusion (XLIF) L2/3 - FIRST OF TWO UNIQUE PROCEDURES PERFORMED IN [place] WITH TWO DISTINCT SURGERIES ON THE SAME PERSON IN THE SAME DAY  Technologist/Technical documentation: Ileana Ladd PhD/CNIM  Neurologist: Barron Alvine, MD  Procedural Notes:  -  PRE-INCISION BASELINE DATA:  -  The surgeon(s) requested surgical neurophysiology in order to protect/identify neural elements at risk during the   operation. After anesthetic induction, sterile electrodes were placed for purposes of multimodal stimulation and recording.   Per the neurologist, baseline finding under anesthesia & intraoperative course by modality were reported to the surgeon   and anesthesiologist and a verbal acknowledgement received (details below):  -  Neuromuscular Junction (NMJ) testing: NMJ testing was explored at the [place] at baseline.   The reading neurologist provided comment at their discretion and as appropriate, and baseline NMJ responses were  communicated to the surgeon as ADEQUATE for motor element monitoring.   -  Electromyography (EMG): Recording electrodes were placed bilaterally in the following muscles to record free running   audible and visual electromyography (EMG):   Upper Extremity: N/A    Lower Extremity: PSOAS ADDUCTOR LONGUS VASTUS LATERALIS VASTUS MEDIALIS TIBIALIS ANTERIOR   GASTROCNEMIUS ABDUCTOR HALLUCIS   The reading neurologist provided comment at their discretion and as appropriate, and baseline EMG responses were   communicated to the surgeon as ADEQUATE THROUGHOUT. See  post-procedural notes for subsequent   procedural details.   -  Motor Evoked Potentials (MEP): Bite block placement was CONFIRMED. Stimulating electrodes were placed in the   scalp over the primary motor cortex to generate baseline trans-cranial electric MEPs with a centrally facilitated double   train of 2 and 7, an inter-train interval of 20uS, a pulse width of 75uS, and stimulation of 220V on the left & 160V on the   right. An attempt was made to record compound muscle action potentials bilaterally from the musculature noted below:  Upper Extremities: THENAR HYPOTHENAR   Lower Extremities: PSOAS ADDUCTOR LONGUS VASTUS LATERALIS VASTUS MEDIALIS   TIBIALIS ANTERIOR GASTROCNEMIUS ABDUCTOR HALLUCIS   A reliable, reproducible MEP is not always generated from every muscle at baseline in patients due to multiple variables   (individual response to anesthesia, proximal musculature, underlying pathology, etc).   The reading neurologist provided comment at their discretion and as appropriate, and baseline MEP responses were   communicated to the surgeon as ADEQUATE THROUGHOUT. See post-procedural notes for subsequent   procedural details.   -  Somatosensory Evoked Potentials (SEP):   Upper Extremity SEPs: Bilateral ULNAR nerves were stimulated to generate a cortical SEP. Baseline stimulation   parameters included a repetition rate of 2.79 Hz, pulse duration of 460mS, with current intensities of 60mA on the left and   52mA on the right. First cortical negativity (N20) response latency was 23.30mS on the left & 24.75mS on the right, with   amplitudes of 1.69uV on the left & 1.22uV on the right.  Lower Extremity SEPs: Bilateral SAPHENOUS nerves were stimulated to generate a cortical SEP. Baseline stimulation  parameters included a repetition rate of 2.79 Hz, pulse duration of 556mS, with current intensities of 76mA  on the left and   20mA on the right. First cortical positivity (P37) response latency was 40.67mS on the left &  41.90mS on the right, with   amplitudes of 0.91uV on the left & 0.38 uV on the right.   The reading neurologist provided comment at their discretion and as appropriate, and b aseline SEP responses were   communicated to the surgeon as PARTIALLY/MINIMALLY PRESENT, Right lower saphenous response was very   small at baseline despite optimization attempts. See post-procedural notes for subsequent procedural details.   -  Electroencephalography (EEG): Scalp EEG was recorded from EIGHT channels covering the head. At baseline, per   neurologist, the overall voltage was NORMAL , the tracing was bilaterally SYMMETRICAL , and the frequency spectrum   varied between 1HZ  and 12HZ . Anesthesia was given information on presence of burst-suppression (B/S) in order to   assist in their determination of anesthetic depth; at baseline this was >50% B/S.  The reading neurologist provided comment at their discretion and as appropriate, and this was communicated to   anesthesia/surgeons as appropriate. See post-procedural notes for subsequent details.  -  PROCEDURAL COURSE AND NOTES:  Per the neurologist, the surgeon and/or anesthesiologist was informed of the following post procedural notes and   acknowledged.  -  ANESTHETIC FADE: Anesthetic fade was WAS NOT OBSERVED in this case.  -  NMJ: NMJ demonstrated ADEQUATE motor responsiveness throughout the case.   -  EMG: NO SIGNIFICANT EMG ACTIVITY   -  MEP: NO ADVERSE EVENTS WERE OBSERVED   -  SEP: NO ADVERSE EVENTS WERE OBSERVED   -  EEG: NO ADVERSE EVENTS WERE OBSERVED Anesthesia staff was given data on B/S at multiple points during   the procedure, as requested, in order to assist in their determination of anesthetic depth (the primary purpose for EEG in   this procedure).   -  Please see Professional Report below for neurologist interpretation.  -  PROFESSIONAL INTERPRETATION: (Dr. Barron Alvine MD)    Baseline Waveforms: (evaluated under conditions of general anesthesia unless  otherwise noted):  NMJ Testing: Within normal limits/unremarkable.  Free Run EMG: Within normal limits/unremarkable; additionally the surgeons employed an independent, surgeon-directed neurophysiological stimulator in order to identify the Lumbar nerves and roots.  MEP: Within normal limits/unremarkable in musculature wherein reliable baseline signals generated.  SEP: Within normal limits/unremarkable in the upper extremities, lower extremities present bilaterally but smaller as typical of saphenous and required modified presence-absence alarm criteria.  EEG: Within normal limits/unremarkable. Anesthetic depth was evaluated with EEG and discussed with anesthesia as appropriate, this was the primary purpose for EEG testing and considered essential to providing assistance to anesthesia in order to obtain optimal cortically-mediated evoked responses.     Post Procedural Notes:  NMJ Testing: The waveforms were substantively unchanged from baseline. No adverse electrodiagnostic events were encountered during NMJ testing/monitoring.  Free Run EMG: The waveforms were substantively unchanged from baseline. No adverse electrodiagnostic events were encountered during monitoring.  MEP: The waveforms were substantively unchanged from baseline. No adverse electrodiagnostic events were encountered during monitoring.  SEP: The waveforms were substantively unchanged from baseline. No adverse electrodiagnostic events were encountered during monitoring.  EEG: EEG varied with the anesthetic regimen and multiple variables as above; anesthesia was notified as data requested to assist in their determination of anesthetic depth. No adverse electrodiagnostic events were encountered during monitoring.    IMPRESSION: This was a successful multimodal intra-operative neurophysiological monitoring study. There was no substantive evidence of intra-operative impairment  of neural structures based upon monitoring in the modalities above, & further  clinical correlation is recommended.    The case was monitored in its entirety; perioperative remote real-time monitoring dedicated personally to this patient alone performed for 1 hour(s) & 8 minute(s).    In-room monitoring dedicated personally to this patient alone performed for  8  minute(s).          Barron Alvine MD    Professor of Runner, broadcasting/film/video, Interventional Neurophysiology Service  [place] [facility]

## 2021-11-09 NOTE — Interdisciplinary (Signed)
Occupational Therapy Evaluation    Admitting Physician:  Zlomislic, Vinko, MD  Admission Date 11/04/2021    Inpatient Diagnosis:   Problem List       Codes    Pain     ICD-10-CM: R52  ICD-9-CM: 780.96    Relevant Orders    Consult/Referral to Primary Care    Decreased functional mobility     ICD-10-CM: R26.89  ICD-9-CM: 781.99    Spinal stenosis of lumbar region with radiculopathy     ICD-10-CM: M48.061, M54.16  ICD-9-CM: 724.02, 724.4    Spondylolisthesis of lumbar region     ICD-10-CM: M43.16  ICD-9-CM: 738.4    DDD (degenerative disc disease), lumbar     ICD-10-CM: M51.36  ICD-9-CM: 722.52    Decreased activities of daily living (ADL)     ICD-10-CM: Z78.9  ICD-9-CM: V49.89          IP Start of Service  Start of Care: 11/09/21  Onset Date: 11/04/2021  Reason for referral: Activity tolerance limitation;Decline in functional ability/mobility;Decline in performance of activities of daily living (ADL)    Preferred [place]         Past Medical History:   Diagnosis Date   . Chronic back pain    . Congenital hydronephrosis    . Gout    . Headache    . Hematuria    . Kidney disease    . Kidney stones    . Major depressive disorder, single episode    . Polyarthropathy or polyarthritis of multiple sites    . Retinal detachment    . Urethral stricture       Past Surgical History:   Procedure Laterality Date   . CT INSERTION OF SUPRAPUBIC CATH  09/25/2015   . NEPHRECTOMY Right 1995   . APPENDECTOMY     . COLONOSCOPY     . CYSTOSCOPY     . CYSTOSCOPY W/ LASER LITHOTRIPSY     . OTHER SURGICAL HISTORY      Interstim 01/29/2011   . SPINE SURGERY  09/21    Lumbar-sacral fusion   . TRANSURETHRAL RESECTION OF PROSTATE          OT Acute     Row Name 11/09/21 1500          Type of Visit    Type of Occupational Therapy note Occupational Therapy Evaluation     Row Name 11/09/21 1500          Treatment Time    Treatment Start Time 1130     Total TIMED Treatment (min) 30     Total Treatment Time (min) 42     Row Name 11/09/21  1500          Treatment Precautions/Restrictions    Precautions/Restrictions Fall;Multiple lines;Postsurgical/procedural;Spine     Fall Socks/charm;Bed/chair alarm     Other Precautions/Restrictions Information lumbar spine precautions, TLSO when [place] Name 11/09/21 1500          Medical History    History of presenting condition Pt is a 70 year old male with hx of XLIF L3-L5, PSF L3-L5 (2020 and ALIF L5-S1, revision PSF L3-S1 (2021) admitted to [facility] on 11/04/2021 with continued lower back pain and leg pain. 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, spinal stenosis  [place] Name 11/09/21 1500          Functional History    Prior Level of Function Minimal deficits     General ADL/Self-Care Assistance Needs Independent with ADLs and self care using adaptive device/equipment     Equipment required for mobility in the home Cane;Walker     Other Functional History Information PTA pt was mod (I) with ADLs, IADLs and mobility using cane     Row Name 11/09/21 1500          Social History    Living Situation Lives with family  sister and brother in law     Home accessibility Performs activities of daily living (ADL's) on one level     Bathroom accessibility [place] Name 11/09/21 1500          Subjective    Subjective information "I am just very tired and in pain."     Patient status Patient agreeable to treatment;Nursing in agreement for treatment;Patient pain control adequate to participate in therapy;Nursing notified of increased pain after treatment     Row Name 11/09/21 1500          Pain Assessment    Pain Asssessment Tool Numeric Pain Rating Scale     Other Pain Assessment Information 0-10     Row Name 11/09/21 1500          Numeric Pain Rating Scale    Pain Intensity - rating at present 10      Pain Intensity- rating after treatment 10     Location surgical site     Row Name 11/09/21 1500          Activities of Daily Living (ADLs)    Self Grooming Supervised     Other Self Grooming Information standing at sink; max cueing to maintain precautions     Upper Body Dressing Minimum assistance (25% assistance);Moderate assistance (25-50% assistance)     Other Upper Body Dressing Information donning/doffing TLSO brace     Toilet Transfers Supervised     Other Toilet Transfers Information walk to bathroom and transfer onto standard toilet     Row Name 11/09/21 1500          [place] AM-PAC: Daily Activity    Assistance Needed to Put on and Take off Regular Lower Body Clothing 2     Assistance Needed to Bathe, Including Washing, Rinsing, and Drying 3     Assistance Needed to Toilet [employer], Bedpan, or Urinal) 3     Assistance Needed to Put on and Take off Regular Upper Body Clothing 3     Assistance Needed to Take Care of Personal Grooming Such as Brushing Teeth 3     Assistance Needed to Eat Meals 4     AM-PAC Daily Activity Total Score 18     AMP-PAC Daily Activity Impairment rating Score 14-19 - 40-59% impaired     Row Name 11/09/21 1500          Objective    Overall Cognitive Status At baseline level;Unable to assess     Other  Cognitive Status Information A&Ox4, max cueing to attend to task due to increased distraction, difficulty retaining information and education     Communication No communication limitations or impairments noted. Current status of hearing, speech and vision allow functional communication.     Coordination/Motor control No limitations or impairments noted. Movement patterns are fluid and coordinated throughout     Balance Balance limitations present  Static Sitting Balance Normal - able to maintain steady balance without handhold support     Dynamic Sitting Balance Good - accepts moderate challenge, able to maintain balance while picking object off floor     Static Standing Balance Good -  able to maintain balance without handhold support, limited postural sway     Dynamic Standing Balance Good - accepts moderate challenge, able to maintain balance while picking object off floor     Other Balance Information able to stand at sink without UE support for grooming tasks     Extremity Assessment Flexibility, strength, muscle tone and sensation grossly within functional limits throughout     Functional Mobility Functional mobility deficits present     Bed Mobility Supervised     Bed Mobility Comments supine >< sit utilizing log roll technique with mod cueing     Transfers to/from Stand Supervised     Transfer Comments sit >< stand bed and standard toilet     Ambulation during functional tasks Supervised     Device used for ambulation/mobility Front wheeled walker     Ambulation Distance short distance mobility in room     Other Objective Findings Pt received sitting upright in bed, in NAD and agreeable for therapy. Pt able to recall 3/3 spinal precautions at start of session. Supine >< sit performed with supervision with mod cueing for proper log roll technique. Sitting EOB, pt required mod A to don TLSO appropriately with max cueing for proper technique and sequencing, as well as cueing to maintain precautions due to excessive twisting. Pt unwilling to simulate LB dressing at this time after AE education/demonstration, reporting he knows how to perform however when talking therapist through it, reported he would bend to perform. All functional transfers and mobility within room performed with supervision using RW, including toilet transfer and walking to and from bathroom. Cueing required for proper placement of RW and to avoid twisting when attempting to leave RW off to the side for standing tasks. Oral care and hand hygiene performed with supervision in standing, with cues for widen BOS and to avoid excessive bending with spitting/rinsing. Pt assisted back to bed and left comfortable with all needs in  reach and alarm on. Worksheet provided on precautions.                 OT Acute Tool Box     Row Name 11/09/21 1500          Cognition Assessment    Overall Cognitive Status At baseline level;Unable to assess                    Eval cont.     [place] Name 11/09/21 1500          Patient/Family Education    Learner(s) Patient     Learner response to rehab patient education interventions Needs reinforcement     Patient/family training comments OT role, POC, spinal precautions, AE training, bed mobility     Row Name 11/09/21 1500          Assessment    Assessment Pt presenting to OT below baseline level of function due to safety awareness impairments, and difficulty maintaining spinal precautions. Max cueing required throughout session to maintain precautions during session, and pt demonstrating poor receptiveness of education and training. Pt reports he lives with family that are not able to help him, however reported to other therapist they would be able to help. Pt with poor attention, inconsistent responses  to questions, difficulty maintaining precautions and impulsivity at this time. Supervision required for all transfers and mobility, with cueing for pacing self and utilizing RW appropriately. Anticipate d/c to home with supervision/assist. OT to follow-up with pt 1-2 more sessions to ensure carryover of precautions during ADLs and mobility.      Rehab Potential Good     Row Name 11/09/21 1500          Patient stated Goal    Patient stated goal Discharge home     [place] Name 11/09/21 1500          Goal 1 (Short Term)    Impairment Safety/judgement     Custom goal Pt will recall and maintain 3/3 spinal precautions 100% of the time during ADLs/mobility     Number of visits 1-2     Goal [place] Name 11/09/21 1500          Goal 2 (Short Term)    Impairment Activities of Daily Living - Lower Body Dressing     Custom goal Pt will perform LB dressing in sitting/standing positions with supervision utilizing AE as  needed     Number of visits 1-2     Goal Status [place] Name 11/09/21 1500          Goal 3 (Short Term)    Impairment Activities of Daily Living - Upper Body Dressing     Custom goal Pt will don/doff TLSO brace with supervision and min verbal cueing at EOB     Number of visits 1-2     Goal Status [place] Name 11/09/21 1500          Planned Therapy Interventions and Rationale    Patient Education to increase independence in functional activities;to increase independence with correct body mechanics technique during functional activities     Self-Care/ADL Training to improve independence with compensatory strategies;to improve independence with adaptive equipment;to improve home safety;to improve safety when completing daily activities and self care     Therapeutic Activities to improve ability to perform self care and ADL's     [place] Name 11/09/21 1500          Treatment Plan Disussion    Treatment Plan Discussion and Agreement Patient support system determined and all questions were asked and answered     [place] Name 11/09/21 1500          Treatment Plan    Continue therapy to address Activity tolerance limitation;Decline in functional ability/mobility;Decline in performance of activities of daily living (ADL)     Frequency of treatment 5 times per week     Duration of treatment (number of visits) Treatment will continue while in hospital and in need of skilled therapy services     Status of treatment Patient evaluated and will benefit from ongoing skilled therapy     [place] Name 11/09/21 1500          Patient Safety Considerations    Patient safety considerations Patient returned to bed at end of treatment;Call light left in reach and fall precautions in place;Patient left  in appropriate pressure relieving position;Patient may be at risk for falls;Nursing notified of safety considerations at end of treatment     Patient assistive device requirements for safe ambulation [place] Name 11/09/21 1500           Post Acute Discharge Recommendations    Discharge  Rehabilitation Recommendations If available, recommend discharge to supervised living situation;Patient requires assistance     Deficits Mobility ADLs;Self-Care ADLs     Level of Care  Home with assitance     Therapy Justification Home safety assessment needs     Current Level of Assistance  Minimum assistance (25% assistance)     Equipment recommendations To be determined as patient progresses in therapy     Row Name 11/09/21 1500          Patient Mobilization Recommendations (as tolerated)     Toileting Patient able to use bathroom     Ambulation Patient to ambulate with nursing two times per day shift, once per night shift     Out of bed Patient out of bed to chair for all meals     Device  Walker     Safe patient handling equipment  None needed     [place] Name 11/09/21 1500          Therapy Plan Communication    Therapy Plan Communication Discussed therapy plan with Nursing and/or Physician     [place] Name 11/09/21 1500          Occupational Therapy Patient Discharge Instructions    Your Occupational Therapist suggests the following Continue to follow your prescribed mobility precautions when transferring to the chair and toilet as instructed;Continue to complete your self care Activities of Daily Living as frequently as possible;Supervision is suggested when you     Supervision is suggested when you bath;dress;toilet     Row Name 11/09/21 1500          Type of Eval    Low Complexity [id number]) Completed     Row Name 11/09/21 1500          Therapeutic Procedures    Therapeutic Activities (33832) Assistance/facilitation of bed mobility;Dynamic activities to improve performance of functional tasks/activities;Facilitation of safety awareness/responses during functional tasks;Functional activities;Progressive mobilization to improve functional independence;Patient education;Simulation of functional activities;Transfer training with weight shift and direction change          Total TIMED Treatment (min) 15     Self-Care/ADL Training [phone]) Activities of daily living training;Dressing;Grooming;Patient education;Personal hygiene;Safety procedures;Self-care activities of dally living         Total TIMED Treatment (min) 15                 The occupational therapist of record is endorsed by evaluating occupational therapist.

## 2021-11-09 NOTE — Progress Notes (Cosign Needed)
Orthopedics Spine Progress Note  11/09/2021    Patient ID:  Name: Stephen [name]  MRN: [id number]  DOB: 1951/09/07    Procedures:  11/08/21  XLIF L2-L3  PSIF L2-S1    Subjective:  Doing well pain stable   No acute events overnight  Getting out of bed on his own     Objective:  Vital Signs:  BP 118/87 (BP Location: Left arm, BP Patient Position: Sitting)   Pulse 104   Temp 98.2 F (36.8 C)   Resp 17   Ht 5\' 6"  (1.676 m)   Wt 91.2 kg (201 lb 1 oz)   SpO2 95%   BMI 32.45 kg/m     Physical Exam:  General: patient awake, alert, and responding to commands; no apparent distress  Cardio: regular rate and rhythm per pulse  Respiratory: patient breathing comfortably without use of accessory muscles  Neck/Back/Abdomen: dressings in place, clean/dry/intact, Drain in place, output ss      Bilateral Lower Extremity   Motor:       Right           Left        Iliopsoas (L2/3)                     5/5           5/5         Quadriceps (L4)                    5/5           5/5        Tibialis Anterior (L4/5)           5/5           5/5        EHL (L5)                               5/5           5/5        GSC (S1)                              5/5           5/5   Sensation:        SILT in L2-S1 distributions.   Special Tests:        Downgoing plantar response. No clonus.    Vascular Exam:        Warm and well perfused distally    Laboratory Data:   Lab Results   Component Value Date    WBC 17.7 (H) 11/09/2021    HGB 11.9 (L) 11/09/2021    HCT 34.1 (L) 11/09/2021    PLT 162 11/09/2021     Lab Results   Component Value Date    NA 137 11/09/2021    K 4.8 11/09/2021    CL 100 11/09/2021    BICARB 25 11/09/2021    BUN 18 11/09/2021    CREAT 1.10 11/09/2021    GLU 206 (H) 11/09/2021    [place] 8.8 11/09/2021     No results found for: INR, PTT      Input/Output:    Intake/Output Summary (Last 24 hours) at 11/09/2021 1356  Last data filed at 11/09/2021 1216  Gross per 24 hour   Intake 2820 ml  Output 4090 ml   Net -1270 ml       Current  Medications:  Current Facility-Administered Medications   Medication   . acetaminophen (TYLENOL) tablet 975 mg   . allopurinol (ZYLOPRIM) tablet 300 mg   . amLODIPINE (NORVASC) tablet 10 mg   . bisacodyl (DULCOLAX) suppository 10 mg    Followed by   . [START ON 11/11/2021] bisacodyl (DULCOLAX) suppository 10 mg   . ceFAZolin in dextrose (ANCEF) IVPB 2,000 mg   . dexAMETHasone (DECADRON) injection 4 mg   . docusate sodium (COLACE) capsule 100 mg   . DULoxetine (CYMBALTA) CR capsule 20 mg   . famotidine (PEPCID) tablet 20 mg   . HYDROmorphone (DILAUDID) injection 0.5 mg   . lidocaine (ASPERCREME) 4 % patch 1 patch   . LORazepam (ATIVAN) tablet 1 mg   . LORazepam (ATIVAN) tablet 1 mg   . magnesium hydroxide (MILK OF MAGNESIA) suspension 30 mL   . ondansetron (ZOFRAN) injection 4 mg   . oxyCODONE (ROXICODONE) tablet 5 mg    Or   . oxyCODONE (ROXICODONE) tablet 10 mg   . oxyCODONE (ROXICODONE) tablet 5 mg   . polyethylene glycol (MIRALAX) packet 17 g   . pregabalin (LYRICA) capsule 100 mg   . senna (SENOKOT) tablet 17.2 mg       Assessment:   71 year old male 1 Day Post-Op s/p above procedures. Patient doing well and progressing appropriately.   - Acute Blood Loss Anemia with component of hemodilution    Plan:  - PT: OOBTC w/ TLSO  - Antibiotics: complete peri-operative   - DVT Prophylaxis: no pharmacologic prophylaxis; SCDs/TED hose, ambulation  - Pain Medication: multimodal   - Diet: regular  - Post op imaging: POD 2 upright xrays  - Drain: suctions  - Foley: dc  - Other: aggressive incentive spirometry  - Dispo: pending , PT clearance, pain control on oral pain medication, CM clearance, PT clearance (need for continued inpatient physical therapy evaluation to assess patient's mobility, in order to ensure a safe dispo plan)        Please page the Orthopaedic Spine Surgery Team with questions or concerns based on patient location:     Established spine floor patients at [facility]: Ortho Spine 1 - [phone]       Established spine floor patients at [facility]: Ortho Spine 2 - [phone]      New spine consultations not yet followed by spine team:    View [facility] on call for "SPINE CONSULT" call schedule (Orthopaedic Spine versus Neurosurgery)   If Orthopaedic Spine is on call please page the [place] on call:   [place]: ORTHOPEDICS/TH   [place]: ORTHOPEDICS/HC

## 2021-11-09 NOTE — Interdisciplinary (Signed)
11/09/21 1222   Readmission Risk   Readmission Within 30 Days of Discharge * Yes   Follow Up/Progress   Is the Patient Medically Stable for Discharge * No   CM Discharge Arrangements Complete No   Where was the patient admitted from? * Home   Barriers to Discharge * None   Anticipated Discharge Disposition/Needs Home   Does the patient have a complex care manager? No   Post Acute Services Referred To [facility] agencies status  Accepted   Family/Caregiver's Assessed for * Readiness, willingness, and ability to provide or support self-management activities   Respite Care * Not Applicable   Patient/Family/Other Are In Agreement With Discharge Plan * To be determined   Public Health Clearance Needed * No   Actions Needed for Discharge Results:  consults/recommendations   Anticipated DischargeTransportation *  Family/Friend       11/09/21  12:24 PM    Medical Intervention(s) requiring continued Hospital Stay:  11/08/2021 Procedure(s):  Revision lumbar spine, posterior, lumbar Lumbar 2-Sacrum  FUSION, SPINE, LUMBAR, XLIF, 1 LEVEL  - PT/OT  - pain mgt  - post op milestone    Anticipated discharge plan/needs:  Patient was IPLOF, lives with sister and brother in law , [facility] , has cane and FWW  HH referral initiated to [facility].  Pending PT/OT recs and final HH orders,     Barriers to Discharge:  None     Action Steps/Follow Up:  Ongoing DCP     Tamera Punt, RN  Care Manager

## 2021-11-09 NOTE — Discharge Instructions (Signed)
Surgeon:   Dr. Lelon Huh    Follow-up Appointment:  Your clinic appointment will be:    Future Appointments   Date Time Provider [place]   11/09/2021  9:30 AM [facility] Shirl Harris     An order has been entered for you to follow up in ortho spine clinic for a post op visit in 2 weeks.   A scheduler will call you to arrange.     If this appointment was not scheduled while you were in the hospital then our clinic scheduler will call you in the next few days to schedule this appointment. If you do not hear from [name] within 3 days you should call the clinic at [phone] (for [facility]) or [phone] (for [place]). You should see [name] in clinic within 2 weeks from hospital discharge.       Reasons to Contact a Doctor Urgently:     Call 911 or return to the hospital immediately if: If you have chest pain, sudden onset shortness of breath, difficulty breathing, dizziness, lightheadedness, change in mental status or any other concerning issues.    If you develop a fever (>101), redness or drainage from the surgical incision site, swelling or redness of an extremity, profuse bleeding or excessive drainage from incisional site, or other concerning issues please call our office to arrange for an evaluation or go to the emergency department.     If you have any questions about your hospital care, your medications, or if you have new or concerning symptoms soon after going home from the hospital, your hospital physician can be contacted in the following manner:   [facility] operator at [phone].  [place] Phone Number: [phone] ([place]) or [phone] [facility]).    Once you are able to see your primary care physician (PCP), your PCP will then be responsible for further medication refills, or appointment referrals.      General Instructions for After Discharge    Your activity level at home should be: no bending, twisting, or lifting over 5 lbs. (approximately a  gallon of milk).  No strenuous or strengthening exercises to be done     you were given instructions to wear a back brace, please wear the brace when out of bed except when in the shower.        Wound Care/Hygiene:  Please leave your surgical dressing on for 5 days from the date of surgery. No showering during that time. Sponge baths only.     Keep dressing clean and dry at all time.     Prior to discharge, ask your nurse to provide you with extra bandages should the existing one get dirty or fall off.    On day 5, take your dressing off and you may shower. Please pat the wound dry with a clean towel so you do not disturb any of the scab formation. Do not pull at the Dermabond, but leave it in place until it falls off on its own.  Cover the incision with sterile guaze and paper tape and change dressing daily until your first post operative visit.    Please call us if there is redness, swelling or drainage.     If any concerns with showering, then OK to leave all dressings in place and sponge bathe only.    Do not bathe in a tub or swim or Jacuzzi (ie do not submerge the incision) until instructed to do so by your surgeon.  Do not use ointments or creams on the incision.      You may notice some bruising around the incision.  This is not uncommon and should begin to go away within the first 2 weeks after surgery.      Medications:    Take medications as prescribed. The short acting narcotic pain medication (usually Oxycodone) can be continued as needed, not to exceed the directions on the prescription (usually up to every 4 hours or 6 pills per day). To help wean off of the pain medications or to supplement your pain control you can use Tylenol to help with pain. If you run out of medication before your follow up visit, please let our clinic know so that we may authorize a refill if indicated. Please keep in mind it may take up to 72 hours for a refill to be authorized.    You may be given a one time prescription  for Lidocaine patches. These are usually only covered once by insurance. However, 4% lidocaine patches can be purchased over the counter if you run out.    Your medication list is located on this After Visit Summary in the Current Discharge Medication List section.  Your nurse will review this information with you before you leave the hospital.    It is very important for you to keep a current medication list with you in order to assist your doctors with your medical care.  Bring this After Visit Summary with you to your follow up appointments.    While you are taking the narcotic pain medication, take the stool softener and drink plenty of water to prevent constipation.    Do not drive while on narcotic medications.    Do not mix narcotic medications with alcohol.       What Needs to Happen Next After Discharge -- Appointments and Follow Up    You should have an office appointment about 2 weeks following your discharge from the hospital.  If you do not please call [phone] ([place]) or [phone] [facility]).     Generally, you should return to see your surgeon at the following intervals, but this may be individualized depending on special circumstances.    Any appointments already scheduled at [place] clinics will be listed in the Future Appointments section at the top of this After Visit Summary.  Any appointments that have been requested, but have not yet been scheduled, will be listed below that under Post Discharge Referrals.

## 2021-11-09 NOTE — Plan of Care (Signed)
Problem: Promotion of Health and Safety  Goal: Promotion of Health and Safety  Description: The patient remains safe, receives appropriate treatment and achieves optimal outcomes (physically, psychosocially, and spiritually) within the limitations of the disease process by discharge.    Information below is the current care plan.  Outcome: Progressing  Flowsheets  Taken 11/09/2021 0100 by Blenda Nicely, RN  Patient /Family stated Goal: Get some rest  Taken 11/08/2021 1051 by Alfonso Ramus, RN  Individualized Interventions/Recommendations #1: Assess for pain, respiratory depression, and mentation. PCA running.  Taken 11/07/2021 1940 by Merril Abbe, RN  Individualized Interventions/Recommendations #4 (if applicable): Cluster nursing care and interventions to promote rest and sleep.  Note: Aox4, forgetful. Pt remains on PCA pump and complains of back pain. Room air with CO2 monitor connected. VSS. Foley in place- 1440mL out overnight. JP drained 160cc serousanguinos.

## 2021-11-09 NOTE — Op Note (Signed)
[place] Interventional Neurophysiology Service - Procedure Report  -  Patient Name: Stephen [name], Stephen [name]  Date of Birth: 25-Aug-1951  Medical Record Number: [id number]  Pre/Post-Operative Diagnosis: Severe lumbar spinal stenosis, spondylolisthesis, degenerative disc disease, and radiculopathy  Date of Procedure: 11/08/21  Begin/End Record Time: 4627 to 2159  Surgeon(s): ZLOMISLIC  Procedure: Stage 2 of 2/posterior approach - Lumbar revision of posterior instrumented fusion - SECOND OF TWO UNIQUE PROCEDURES PERFORMED IN [place] WITH TWO DISTINCT SURGERIES ON THE SAME PERSON IN THE SAME DAY  Technologist/Technical documentation: Ileana Ladd PhD/CNIM  Neurologist: Barron Alvine, MD  Procedural Notes:  -  PRE-INCISION BASELINE DATA:  -  The surgeon(s) requested surgical neurophysiology in order to protect/identify neural elements at risk during the   operation. After anesthetic induction, sterile electrodes were placed for purposes of multimodal stimulation and recording.   Per the neurologist, baseline finding under anesthesia & intraoperative course by modality were reported to the surgeon   and anesthesiologist and a verbal acknowledgement received (details below):  -  Neuromuscular Junction (NMJ) testing: NMJ testing was explored at the [place] at baseline.   The reading neurologist provided comment at their discretion and as appropriate, and baseline NMJ responses were  communicated to the surgeon as ADEQUATE for motor element monitoring.   -  Electromyography (EMG): Recording electrodes were placed bilaterally in the following muscles to record free running   audible and visual electromyography (EMG):   Upper Extremity: N/A   Lower Extremity: PSOAS ADDUCTOR LONGUS VASTUS LATERALIS VASTUS MEDIALIS TIBIALIS ANTERIOR   GASTROCNEMIUS ABDUCTOR HALLUCIS   The reading neurologist provided comment at their discretion and as appropriate, and baseline EMG responses were   communicated to the surgeon as ADEQUATE THROUGHOUT.  See post-procedural notes for subsequent   procedural details.   -  Motor Evoked Potentials (MEP): Bite block placement was CONFIRMED. Stimulating electrodes were placed in the   scalp over the primary motor cortex to generate baseline trans-cranial electric MEPs with a centrally facilitated double   train of 2 and 7, an inter-train interval of 20uS, a pulse width of 75uS, and stimulation of 220V on the left & 160V on the   right. An attempt was made to record compound muscle action potentials bilaterally from the musculature noted below:   Upper Extremities: THENAR HYPOTHENAR   Lower Extremities: PSOAS ADDUCTOR LONGUS VASTUS LATERALIS VASTUS MEDIALIS   TIBIALIS ANTERIOR GASTROCNEMIUS ABDUCTOR HALLUCIS   A reliable, reproducible MEP is not always generated from every muscle at baseline in patients due to multiple variables   (individual response to anesthesia, proximal musculature, underlying pathology, etc).   The reading neurologist provided comment at their discretion and as appropriate, and baseline MEP responses were   communicated to the surgeon as PARTIALLY/MINIMALLY PRESENT, Left adductor response was extremely small.  See post-procedural notes for subsequent procedural details.   -  Somatosensory Evoked Potentials (SEP):   Upper Extremity SEPs: Bilateral ULNAR nerves were stimulated to generate a cortical SEP. Baseline stimulation   parameters included a repetition rate of 2.79 Hz, pulse duration of 423mS, with current intensities of 48mA on the left and   39mA on the right. First cortical negativity (N20) response latency was 23.25mS on the left & 24.75mS on the right, with   amplitudes of 1.69uV on the left & 1.22uV on the right.  Lower Extremity SEPs: Bilateral POSTERIOR TIBIAL nerves were stimulated to generate a cortical SEP. Baseline   stimulation parameters included a repetition rate of 2.79 Hz, pulse duration  of 539mS, with current intensities of 87mA on   the left and 64mA on the right. First  cortical positivity (P37) response latency was 39mS on the left & 50.20mS on the right,   with amplitudes of 1.27uV on the left &1.66uV on the right.   The reading neurologist provided comment at their discretion and as appropriate, and b aseline SEP responses were   communicated to the surgeon as ADEQUATE THROUGHOUT. See post-procedural notes for subsequent   procedural details.   -  Electroencephalography (EEG): Scalp EEG was recorded from EIGHT channels covering the head. At baseline, per   neurologist, the overall voltage was NORMAL , the tracing was bilaterally SYMMETRICAL , and the frequency spectrum   varied between 1HZ  and 12HZ . Anesthesia was given information on presence of burst-suppression (B/S) in order to   assist in their determination of anesthetic depth; at baseline this was >50% B/S.  The reading neurologist provided comment at their discretion and as appropriate, and this was communicated to   anesthesia/surgeons as appropriate. See post-procedural notes for subsequent details.  -  PROCEDURAL COURSE AND NOTES:  Per the neurologist, the surgeon and/or anesthesiologist was informed of the following post procedural notes and   acknowledged.  -  ANESTHETIC FADE: Anesthetic fade was OBSERVED TO A SMALL DEGREE in this case.  -  NMJ: NMJ demonstrated ADEQUATE motor responsiveness throughout the case.   -  EMG: NO SIGNIFICANT EMG ACTIVITY   -  MEP: NO ADVERSE EVENTS WERE OBSERVED   -  SEP: NO ADVERSE EVENTS WERE OBSERVED   -  EEG: NO ADVERSE EVENTS WERE OBSERVED Anesthesia staff was given data on B/S at multiple points during   the procedure, as requested, in order to assist in their determination of anesthetic depth (the primary purpose for EEG in   this procedure).   -  Please see Professional Report below for neurologist interpretation.  -  PROFESSIONAL INTERPRETATION: (Dr. Barron Alvine MD)    Baseline Waveforms: (evaluated under conditions of general anesthesia unless otherwise noted):  NMJ Testing:  Within normal limits/unremarkable.  Free Run EMG: Within normal limits/unremarkable.  MEP: Within normal limits/unremarkable in musculature wherein reliable baseline signals generated.  SEP: Within normal limits/unremarkable.  EEG: Within normal limits/unremarkable. Anesthetic depth was evaluated with EEG and discussed with anesthesia as appropriate, this was the primary purpose for EEG testing and considered essential to providing assistance to anesthesia in order to obtain optimal cortically-mediated evoked responses.     Post Procedural Notes:  NMJ Testing: The waveforms were substantively unchanged from baseline. No adverse electrodiagnostic events were encountered during NMJ testing/monitoring.  Free Run EMG: The waveforms were substantively unchanged from baseline. No adverse electrodiagnostic events were encountered during monitoring.  MEP: The waveforms were substantively unchanged from baseline. No adverse electrodiagnostic events were encountered during monitoring.  SEP: The waveforms were substantively unchanged from baseline. No adverse electrodiagnostic events were encountered during monitoring.  EEG: EEG varied with the anesthetic regimen and multiple variables as above; anesthesia was notified as data requested to assist in their determination of anesthetic depth. No adverse electrodiagnostic events were encountered during monitoring.    IMPRESSION: This was a successful multimodal intra-operative neurophysiological monitoring study. There was no substantive evidence of intra-operative impairment of neural structures based upon monitoring in the modalities above, & further clinical correlation is recommended.    The case was monitored in its entirety; perioperative remote real-time monitoring dedicated personally to this patient alone performed for 2 hour(s) & 53 minute(s).  Barron Alvine MD    Professor of Runner, broadcasting/film/video, Interventional Neurophysiology Service  [place] [facility]  [facility]

## 2021-11-09 NOTE — Interdisciplinary (Signed)
Physical Therapy Evaluation    Admitting Physician:  Zlomislic, Vinko, MD  Admission Date 11/04/2021    Inpatient Diagnosis:   Problem List       Codes    Pain     ICD-10-CM: R52  ICD-9-CM: 780.96    Relevant Orders    Consult/Referral to Primary Care    Decreased functional mobility     ICD-10-CM: R26.89  ICD-9-CM: 781.99          IP Start of Service   Start of Care: 11/09/21  Onset Date: 11/04/21  Reason for referral: Decline in functional ability/mobility    Preferred [place]         Past Medical History:   Diagnosis Date   . Chronic back pain    . Congenital hydronephrosis    . Gout    . Headache    . Hematuria    . Kidney disease    . Kidney stones    . Major depressive disorder, single episode    . Polyarthropathy or polyarthritis of multiple sites    . Retinal detachment    . Urethral stricture       Past Surgical History:   Procedure Laterality Date   . CT INSERTION OF SUPRAPUBIC CATH  09/25/2015   . NEPHRECTOMY Right 1995   . APPENDECTOMY     . COLONOSCOPY     . CYSTOSCOPY     . CYSTOSCOPY W/ LASER LITHOTRIPSY     . OTHER SURGICAL HISTORY      Interstim 01/29/2011   . SPINE SURGERY  09/21    Lumbar-sacral fusion   . TRANSURETHRAL RESECTION OF PROSTATE          PT Acute     Row Name 11/09/21 1400          Type of Visit    Type of Physical Therapy note Physical Therapy Evaluation     Row Name 11/09/21 1400          Treatment Precautions/Restrictions    Precautions/Restrictions Fall;Spine     Fall Socks/charm     Other Precautions/Restrictions Information TLSO OOB     Row Name 11/09/21 1400          Medical History    History of presenting condition Patient is a 70 year old male s/p revision lateral interbody fusion L2-3     Fall history No falls reported in the last 6 months     Stephen [name] Name 11/09/21 1400          Functional History    Prior Level of Function Minimal deficits     Equipment required for mobility in the home Stephen [name]     Other Functional History Information uses cane as needed, owns Stephen [name] Name 11/09/21 1400          Social History    Living Situation Lives with parent/family     Stephen [name] accessibility  Accessible with wheelchair or walker     Other Social History Information Pt lives with his sister and brother in [place]. Sister able to assist as needed upon d/c     Row Name 11/09/21 1400          Subjective    Subjective Information Patient reports that he wants to walk     Patient status Patient agreeable to treatment;Nursing in agreement for treatment     Stephen [name] Name 11/09/21 1400  Pain Assessment    Pain Asssessment Tool Numeric Pain Rating Scale     Row Name 11/09/21 1400          Numeric Pain Rating Scale    Pain Intensity - rating at present 9     Pain Intensity- rating after treatment 9     Location low back and RLE     Row Name 11/09/21 1400          Objective    Overall Cognitive Status Impaired     Other  Cognitive Status Information A&O x4, but with decreased safety awareness, forgetful - forgot he had surgery yesterday but then corrects himself     Communication No communication limitations or impairments noted. Current status of hearing, speech and vision allow functional communication.     Coordination/Motor control No limitations or impairments noted. Movement patterns are fluid and coordinated throughout     Balance Balance limitations present     Static Sitting Balance Normal - able to maintain steady balance without handhold support     Dynamic Sitting Balance Good - accepts moderate challenge, able to maintain balance while picking object off floor     Static Standing Balance Good - able to maintain balance without handhold support, limited postural sway     Dynamic Standing Balance Fair - accepts minimal challenge, able to maintain balance while turning head/trunk     Other Balance Information standing balance with UE support on FWW     Extremity Assessment Flexibility, strength, muscle tone and sensation grossly within functional limits throughout      Other  Extremity Assessment  Information reports mild increase in sensitivity to RLE with light touch sensation - intact     Functional Mobility Functional mobility deficits present     Bed Mobility Supervised     Bed Mobility Comments OOB towards patient's R     Transfers to/from Stand Supervised     Transfer Comments with use of FWW, cues for safety     Gait Other (comments)  SBA     Gait Comments decreased cadence, forward flexed posture, moderate verbal cues for upright posture with fair effect, standing (but forward flexed - leaning on walker) rest breaks required due to pain and fatigue, short step length bilaterally, no LOB     Device used for ambulation/mobility Front wheeled walker     Ambulation Distance 200 feet total with multiple standing rest breaks     Other Objective Findings Patient presented supine in bed, agreeable to PT. Patient assisted to don TLSO at EOB. Patient performed mobility as described above, requiring frequent cueing for trunk/hip extension. Patient left seated EOB at end of session with needs met.                      Eval cont.     Stephen [name] Name 11/09/21 1400          Patient/Family Education    Learner(s) Patient     Learner response to rehab patient education interventions Verbalizes understanding     Patient/family training comments role of PT, safe mobility, spine precautions     Row Name 11/09/21 1400          Assessment    Assessment Patient is a 70 year old male s/p revision lateral interbody fusion L2-3. Patient presents below his functional baseline, limited by pain, impaired balance, and decreased activity tolerance impacting his overall functional mobility. Patient will benefit from skilled PT, likely 1-2 more  sessions, to address deficits identified. Recommend DC home with assist and HHPT when appropriate.     Rehab Potential Good     Row Name 11/09/21 1400          Patient stated Goal    Patient stated goal to walk     Row Name 11/09/21 1400          Goal 1 (Short Term)     Impairment Gait impairment     Custom goal Patient will require no more than supervision for ambulation x200+ feet with FWW     Number of visits 2     Goal Status New     Row Name 11/09/21 1400          Goal 2 (Short Term)    Impairment Balance impairment     Balance To improve postural control and safety in standing, patient able to maintain static standing balance without handhold support and limited postural sway     Number of visits 2     Goal Status New     Row Name 11/09/21 1400          Planned Therapy Interventions and Rationale    Gait Training to normalize gait pattern and improve safety while ambulating with assistive device     Neuromuscular Re-Education to improve safety during dynamic activities;to improve kinesthetic awareness and postural control     Therapeutic Activities to improve functional mobility and ability to navigate in the home and/or community;to improve transfers between surfaces     Theraputic Exercise to increase strength to allow greater independence with functional mobility skills     Row Name 11/09/21 1400          Treatment Plan Disussion    Treatment Plan Discussion and Agreement Patient/family/caregiver stated understanding and agreement with the therapy plan     [place] Name 11/09/21 1400          Treatment Plan    Continue therapy to address Decline in functional ability/mobility     Frequency of treatment 7 times per week     Duration of treatment (number of visits) While patient is hospitalized and in need of skilled therapy services     Status of treatment Patient evaluated and will benefit from ongoing skilled therapy     Row Name 11/09/21 1400          Patient Safety Considerations    Patient safety considerations Patient left sitting at end of treatment;Call light left in reach and fall precautions in place;Nursing notified of safety considerations at end of treatment     Patient assistive device requirements for safe ambulation [place] Name 11/09/21 [place] Communication Discussed therapy plan with Nursing and/or Physician;Encouraged out of bed with assistance by     Encouraged out of bed with assistance by Nursing;Staff     Row Name 11/09/21 1400          Physical Therapy Patient Discharge Instructions    Your Physical Therapist suggests the following Supervision with walking is suggested for increased safety;Continue to use your assistive device as instructed when walking to improve your stability and prevent falls     Row Name 11/09/21 1400          Type of Eval    Low Complexity (70350) Completed     Row Name 11/09/21 1400  Therapeutic Procedures    Gait Training [phone]) Assistive device training;Dynamic activities while walking;Gait pattern analysis and treatment of deviations;Patient education        Total TIMED Treatment (min)  15     Therapeutic Activities [phone])  Assistance/facilitation of bed mobility;Patient education;Progressive mobilization to improve functional independence;Transfer training with weight shift and direction change        Total TIMED Treatment (min)  15     Row Name 11/09/21 1400          Treatment Time     Total TIMED Treatment  (min) 30     Total Treatment Time (min) 40     Treatment start time 1030               Post Acute Discharge Recommendations  Discharge Rehabilitation Recommendations : If available, recommend discharge to supervised living situation;If available, patient would benefit from ongoing therapy  Level of Care : Home with assitance  Therapy type : Home health  Therapy Justification: Mobility ADL training needs  Current Level of Assistance : Other (Comments) (SBA)  Equipment recommendations: No equipment needed - patient has own equipment    The physical therapist of record is endorsed by evaluating physical therapist.

## 2021-11-10 ENCOUNTER — Inpatient Hospital Stay (HOSPITAL_BASED_OUTPATIENT_CLINIC_OR_DEPARTMENT_OTHER): Payer: Medicare Other

## 2021-11-10 LAB — MRSA SURVEILLANCE CULTURE

## 2021-11-10 NOTE — Plan of Care (Signed)
Problem: Promotion of Health and Safety  Goal: Promotion of Health and Safety  Description: The patient remains safe, receives appropriate treatment and achieves optimal outcomes (physically, psychosocially, and spiritually) within the limitations of the disease process by discharge.    Information below is the current care plan.  Outcome: Progressing  Flowsheets  Taken 11/10/2021 1312 by Anne Ng, RN  Individualized Interventions/Recommendations #1: Monitor pain and manage w/ prns  Individualized Interventions/Recommendations #2 (if applicable): Promote mobility, TLSO when OOB, ambulate x4, PT/OT  Individualized Interventions/Recommendations #3 (if applicable): Monitor I&Os, JP to gravity for now per MD  Individualized Interventions/Recommendations #4 (if applicable): Maintain safety precautions - general spine, fall  Outcome Evaluation (rationale for progressing/not progressing) every shift: A&Ox4. VSS, afebrile, RA. Moderate-severe back pain, oxycodone given x1. Neurovasc intact - cap refill +2, full sensation w/o numbness/tingling. Able to ambulate x3, works well w/ PT/OT, TLSO brace when OOB. Back surgical site dressing c/d/i w/o s/s of infection. Per MD will keep JP to gravity for now, 157ml total of bloody output. Tolerating diet, passing gas, voiding. Xray of TL spine done.  Taken 11/10/2021 0800 by Anne Ng, RN  Patient /Family stated Goal: Ambulate 3x today  Taken 11/07/2021 0119 by Lovie Macadamia, RN  Guidelines: Inpatient Nursing Guidelines  Note:

## 2021-11-10 NOTE — Interdisciplinary (Signed)
Occupational Therapy Discharge Summary    Admitting Physician:  Zlomislic, Vinko, MD  Admission Date 11/04/2021    Inpatient Diagnosis:   Problem List       Codes    Pain     ICD-10-CM: R52  ICD-9-CM: 780.96    Relevant Orders    Consult/Referral to Primary Care    Decreased functional mobility     ICD-10-CM: R26.89  ICD-9-CM: 781.99    Spinal stenosis of lumbar region with radiculopathy     ICD-10-CM: M48.061, M54.16  ICD-9-CM: 724.02, 724.4    Spondylolisthesis of lumbar region     ICD-10-CM: M43.16  ICD-9-CM: 738.4    DDD (degenerative disc disease), lumbar     ICD-10-CM: M51.36  ICD-9-CM: 722.52    Decreased activities of daily living (ADL)     ICD-10-CM: Z78.9  ICD-9-CM: V49.89          IP Start of Service  Start of Care: 11/09/21  Onset Date: 11/04/2021  Reason for referral: Activity tolerance limitation;Decline in functional ability/mobility;Decline in performance of activities of daily living (ADL)    Preferred [facility]         Past Medical History:   Diagnosis Date   . Chronic back pain    . Congenital hydronephrosis    . Gout    . Headache    . Hematuria    . Kidney disease    . Kidney stones    . Major depressive disorder, single episode    . Polyarthropathy or polyarthritis of multiple sites    . Retinal detachment    . Urethral stricture       Past Surgical History:   Procedure Laterality Date   . CT INSERTION OF SUPRAPUBIC CATH  09/25/2015   . NEPHRECTOMY Right 1995   . APPENDECTOMY     . COLONOSCOPY     . CYSTOSCOPY     . CYSTOSCOPY W/ LASER LITHOTRIPSY     . OTHER SURGICAL HISTORY      Interstim 01/29/2011   . SPINE SURGERY  09/21    Lumbar-sacral fusion   . TRANSURETHRAL RESECTION OF PROSTATE          OT Acute     Row Name 11/10/21 1400          Type of Visit    Type of Occupational Therapy note Occupational Therapy Discharge Summary     Row Name 11/10/21 1400          Treatment Time    Treatment Start Time 9485     Total TIMED Treatment (min) 30     Total Treatment Time (min) 25     Row  Name 11/10/21 1400          Treatment Precautions/Restrictions    Precautions/Restrictions Fall;Postsurgical/procedural;Spine     Fall Socks/charm     Other Precautions/Restrictions Information lumbar spine precautions, TLSO when [place] Name 11/10/21 1400          Medical History    History of presenting condition Pt is a 70 year old male with hx of XLIF L3-L5, PSF L3-L5 (2020 and ALIF L5-S1, revision PSF L3-S1 (2021) admitted to [facility] on 11/04/2021 with continued lower back pain and leg pain. 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 Pt lives with his sister and additional family members. Pt states his sister can provide SUP/assist as needed.     [place] Name 11/10/21 1400          Subjective    Subjective information Pt feeling well and agreeable to therapy     Patient status Patient agreeable to treatment;Nursing in agreement for treatment;Patient pain control adequate to participate in therapy;Nursing notified of increased pain after treatment     Row Name 11/10/21 1400          Pain Assessment    Pain Asssessment Tool Numeric  Pain Rating Scale     Row Name 11/10/21 1400          Numeric Pain Rating Scale    Pain Intensity - rating at present 0     Pain Intensity- rating after treatment 0     Location no c/o pain     Row Name 11/10/21 1400          Activities of Daily Living (ADLs)    Self Feeding Independent     Upper Body Dressing Independent     Lower Body Dressing Independent     Other Lower Body Dressing Information don/doff pants     Toilet Transfers Independent     Other Toilet Transfers Information STS from regular toilet     [place] Name 11/10/21 1400          [place] AM-PAC: Daily Activity    Assistance Needed to Put on and Take off Regular Lower Body Clothing 4     Assistance Needed to Bathe, Including Washing, Rinsing, and Drying 3     Assistance Needed to Toilet Environmental manager, Bedpan, or Urinal) 4     Assistance Needed to Put on and Take off Regular Upper Body Clothing 4     Assistance Needed to Take Care of Personal Grooming Such as Brushing Teeth 4     Assistance Needed to Eat Meals 4     AM-PAC Daily Activity Total Score 23     AMP-PAC Daily Activity Impairment rating Score 23 - 1-19% impaired     Row Name 11/10/21 1400          Objective    Overall Cognitive Status At baseline level;Unable to assess     Other  Cognitive Status Information A&Ox 4, baseline     Communication No communication limitations or impairments noted. Current status of hearing, speech and vision allow functional communication.     Coordination/Motor control No limitations or impairments noted. Movement patterns are fluid and coordinated throughout     Balance Balance limitations present     Static Sitting Balance Normal -  able to maintain steady balance without handhold support     Dynamic Sitting Balance Good - accepts moderate challenge, able to maintain balance while picking object off floor     Static Standing Balance Good - able to maintain balance without handhold support, limited postural sway     Dynamic Standing Balance Good - accepts moderate  challenge, able to maintain balance while picking object off floor     Extremity Assessment Flexibility, strength, muscle tone and sensation grossly within functional limits throughout     Functional Mobility Functional mobility deficits present     Bed Mobility Other (comments)     Bed Mobility Comments Pt received sitting up in chair     Transfers to/from Stand Modified independent     Transfer Comments STS from bedside chair and toilet w/ FWW     Ambulation during functional tasks Supervised     Device used for ambulation/mobility Front wheeled walker     Ambulation Distance within room and hallway     Device used Comments FWW     Other Objective Findings Pt received sitting up in chair adn agreeable to therapy. Pt able to recall 3/3 spine precautions IND. Pt educated on LB dressing via figure 4 and able to return demo to don/doff pants IND. Pt demos donning TLSO brace w/ SUP. Pt demos STS and functioanl mobility to bathroom w/ FWW and MOD I. Pt demos toilet transfer IND. Pt demos functional mobility in hallway to increase activity tolerance w/ FWW and SUP. Pt returns to room and left sitting up in chair w/ call bell and all needs met. RN made aware.                OT Acute Tool Box     Row Name 11/10/21 1400          Cognition Assessment    Overall Cognitive Status At baseline level;Unable to assess                    Eval cont.     [place] Name 11/10/21 1400          Patient/Family Education    Learner(s) Patient     Learner response to rehab patient education interventions Verbalizes understanding;Able to return demonstrate teaching     Patient/family training comments OT role, POC, spinal precautions, AE training, bed mobility     Row Name 11/10/21 1400          Assessment    Assessment Pt presents as overall SUP-IND for ADL's and functional mobility. Pt presents with good return for LB dressing, TLSO mgmt, and all functional transfers and mobility. Pt appears to be functioning at/near baseline wiht no further  skilled IPOT needs at this time. Anticipate safe d/c home when medically stable. Recommend shower chair however pt declining to use.     Rehab Potential Good     Row Name 11/10/21 1400          Patient stated Goal    Patient stated goal Discharge home     [place] Name 11/10/21 1400          Goal 1 (Short Term)    Impairment Safety/judgement     Custom goal Pt will recall and maintain 3/3 spinal precautions 100% of the time during ADLs/mobility     Number of visits 1-2     Goal Status Met     Row Name 11/10/21 1400  Goal 2 (Short Term)    Impairment Activities of Daily Living - Lower Body Dressing     Custom goal Pt will perform LB dressing in sitting/standing positions with supervision utilizing AE as needed     Number of visits 1-2     Goal Status Met     Row Name 11/10/21 1400          Goal 3 (Short Term)    Impairment Activities of Daily Living - Upper Body Dressing     Custom goal Pt will don/doff TLSO brace with supervision and min verbal cueing at EOB     Number of visits 1-2     Goal Status Met     Row Name 11/10/21 1400          Treatment Plan Discussion    Treatment Plan Discussion and Agreement Patient support system determined and all questions were asked and answered     [place] Name 11/10/21 1400          Treatment Plan    Continue therapy to address Other (comments)     Frequency of treatment Other (comments)     Duration of treatment (number of visits) Other (comments)     Status of treatment Patient appropriate for discharge from therapy     [place] Name 11/10/21 1400          Patient Safety Considerations    Patient safety considerations Patient returned to bed at end of treatment;Call light left in reach and fall precautions in place;Patient left  in appropriate pressure relieving position;Patient may be at risk for falls;Nursing notified of safety considerations at end of treatment     Patient assistive device requirements for safe ambulation [place] Name 11/10/21 1400          Post Acute  Discharge Recommendations    Discharge Rehabilitation Recommendations If available, recommend discharge to supervised living situation;Patient requires assistance     Deficits Mobility ADLs;Self-Care ADLs     Level of Care  Home with assistance     Therapy Justification Home safety assessment needs     Current Level of Assistance  Supervised;Modified Independent with device     Equipment recommendations Shower Chair/Bench  OT rec shower chair however pt declining recommendation     Row Name 11/10/21 1400          Patient Mobilization Recommendations (as tolerated)     Toileting Patient able to use bathroom     Ambulation Patient to ambulate with nursing two times per day shift, once per night shift     Out of bed Patient out of bed to chair for all meals     Device  Walker     Safe patient handling equipment  None needed     [place] Name 11/10/21 1400          Therapy Plan Communication    Therapy Plan Communication Discussed therapy plan with Nursing and/or Physician     [place] Name 11/10/21 1400          Occupational Therapy Patient Discharge Instructions    Your Occupational Therapist suggests the following Continue to follow your prescribed mobility precautions when transferring to the chair and toilet as instructed;Continue to complete your self care Activities of Daily Living as frequently as possible;Supervision is suggested when you     Supervision is suggested when you bath;dress;toilet     Row Name 11/10/21 1400  Discharge Report    Discharge Report Date 11/10/21     Reason for discharge Goals met, no further skilled treatment indicated     Discharge [place]     Patient participation Excellent, patient participated in all treatment sessions     Patient compliance with therapy program Excellent     Response to therapy Excellent     Row Name 11/10/21 1400          Therapeutic Procedures    Therapeutic Activities (57017) Dynamic activities to improve performance of functional  tasks/activities;Facilitation of safety awareness/responses during functional tasks;Functional activities;Progressive mobilization to improve functional independence;Patient education;Simulation of functional activities;Transfer training with weight shift and direction change;Graded physical assist for functional activities         Total TIMED Treatment (min) 15     Self-Care/ADL Training [phone] Self-care activities of dally living;Safety procedures;Patient education;Dressing;Other (comment)  TLSO mgmt         Total TIMED Treatment (min) 10                 The occupational therapist of record is endorsed by evaluating occupational therapist.

## 2021-11-10 NOTE — Progress Notes (Signed)
Orthopedics Spine Progress Note  11/10/2021    Patient ID:  Name: Stephen [name]  MRN: [id number]  DOB: Jan 05, 1951    Procedures:  11/08/21  XLIF L2-L3  PSIF L2-S1    Subjective:  Doing well  Reporting mod back/surg site pain  Leg pain resolved     Objective:  Vital Signs:  BP (!) 97/55 (BP Location: Left arm, BP Patient Position: Semi-Fowlers)   Pulse 63   Temp 97.9 F (36.6 C)   Resp 18   Ht 5\' 6"  (1.676 m)   Wt 91.2 kg (201 lb 1 oz)   SpO2 95%   BMI 32.45 kg/m     Physical Exam:  General: patient awake, alert, and responding to commands; no apparent distress  Cardio: regular rate and rhythm per pulse  Respiratory: patient breathing comfortably without use of accessory muscles  Neck/Back/Abdomen: dressings in place, clean/dry/intact, Drain in place, output ss      Bilateral Lower Extremity   Motor:       Right           Left        Iliopsoas (L2/3)                     5/5           5/5         Quadriceps (L4)                    5/5           5/5        Tibialis Anterior (L4/5)           5/5           5/5        EHL (L5)                               5/5           5/5        GSC (S1)                              5/5           5/5   Sensation:        SILT in L2-S1 distributions.   Special Tests:        Downgoing plantar response. No clonus.    Vascular Exam:        Warm and well perfused distally    Laboratory Data:   Lab Results   Component Value Date    WBC 17.7 (H) 11/09/2021    HGB 11.9 (L) 11/09/2021    HCT 34.1 (L) 11/09/2021    PLT 162 11/09/2021     Lab Results   Component Value Date    NA 137 11/09/2021    K 4.8 11/09/2021    CL 100 11/09/2021    BICARB 25 11/09/2021    BUN 18 11/09/2021    CREAT 1.10 11/09/2021    GLU 206 (H) 11/09/2021    [place] 8.8 11/09/2021     No results found for: INR, PTT      Input/Output:    Intake/Output Summary (Last 24 hours) at 11/10/2021 0802  Last data filed at 11/10/2021 0654  Gross per 24 hour   Intake 920 ml   Output 970 ml  Net -50 ml       Current  Medications:  Current Facility-Administered Medications   Medication   . acetaminophen (TYLENOL) tablet 975 mg   . allopurinol (ZYLOPRIM) tablet 300 mg   . amLODIPINE (NORVASC) tablet 10 mg   . bisacodyl (DULCOLAX) suppository 10 mg    Followed by   . [START ON 11/11/2021] bisacodyl (DULCOLAX) suppository 10 mg   . ceFAZolin in dextrose (ANCEF) IVPB 2,000 mg   . docusate sodium (COLACE) capsule 100 mg   . DULoxetine (CYMBALTA) CR capsule 20 mg   . famotidine (PEPCID) tablet 20 mg   . HYDROmorphone (DILAUDID) injection 0.5 mg   . lidocaine (ASPERCREME) 4 % patch 1 patch   . LORazepam (ATIVAN) tablet 1 mg   . LORazepam (ATIVAN) tablet 1 mg   . magnesium hydroxide (MILK OF MAGNESIA) suspension 30 mL   . ondansetron (ZOFRAN) injection 4 mg   . oxyCODONE (ROXICODONE) tablet 5 mg    Or   . oxyCODONE (ROXICODONE) tablet 10 mg   . oxyCODONE (ROXICODONE) tablet 5 mg   . polyethylene glycol (MIRALAX) packet 17 g   . pregabalin (LYRICA) capsule 100 mg   . senna (SENOKOT) tablet 17.2 mg       Assessment:   70 year old male 2 Days Post-Op s/p above procedures. Patient doing well and progressing appropriately.   - Acute Blood Loss Anemia with component of hemodilution    Plan:  - PT: OOBTC w/ TLSO  - Antibiotics: complete peri-operative   - DVT Prophylaxis: no pharmacologic prophylaxis; SCDs/TED hose, ambulation  - Pain Medication: multimodal   - Diet: regular  - Post op imaging: POD 2 upright xrays  - Drain: to gravity currently   - Foley: dc  - Other: aggressive incentive spirometry  - Dispo: pending , PT clearance, pain control on oral pain medication, CM clearance, PT clearance (need for continued inpatient physical therapy evaluation to assess patient's mobility, in order to ensure a safe dispo plan)        Please page the Orthopaedic Spine Surgery Team with questions or concerns based on patient location:     Established spine floor patients at [facility]: Ortho Spine 1 - [phone]      Established spine floor patients at  [facility]: Ortho Spine 2 - [phone]      New spine consultations not yet followed by spine team:    [facility] on call for "SPINE CONSULT" call schedule (Orthopaedic Spine versus Neurosurgery)   If Orthopaedic Spine is on call please page the [facility] on call:   [facility] [place]: ORTHOPEDICS/TH   [facility]: ORTHOPEDICS/HC

## 2021-11-10 NOTE — Interdisciplinary (Signed)
Physical Therapy Daily Treatment Note    Admitting Physician:  Zlomislic, Vinko, MD  Admission Date 11/04/2021    Inpatient Diagnosis:   Problem List       Codes    Pain     ICD-10-CM: R52  ICD-9-CM: 780.96    Relevant Orders    Consult/Referral to Primary Care    Decreased functional mobility     ICD-10-CM: R26.89  ICD-9-CM: 781.99    Spinal stenosis of lumbar region with radiculopathy     ICD-10-CM: M48.061, M54.16  ICD-9-CM: 724.02, 724.4    Spondylolisthesis of lumbar region     ICD-10-CM: M43.16  ICD-9-CM: 738.4    DDD (degenerative disc disease), lumbar     ICD-10-CM: M51.36  ICD-9-CM: 722.52    Decreased activities of daily living (ADL)     ICD-10-CM: Z78.9  ICD-9-CM: V49.89          IP Start of Service   Start of Care: 11/09/21  Onset Date: 11/04/21  Reason for referral: Decline in functional ability/mobility    Preferred [place]         Past Medical History:   Diagnosis Date   . Chronic back pain    . Congenital hydronephrosis    . Gout    . Headache    . Hematuria    . Kidney disease    . Kidney stones    . Major depressive disorder, single episode    . Polyarthropathy or polyarthritis of multiple sites    . Retinal detachment    . Urethral stricture       Past Surgical History:   Procedure Laterality Date   . CT INSERTION OF SUPRAPUBIC CATH  09/25/2015   . NEPHRECTOMY Right 1995   . APPENDECTOMY     . COLONOSCOPY     . CYSTOSCOPY     . CYSTOSCOPY W/ LASER LITHOTRIPSY     . OTHER SURGICAL HISTORY      Interstim 01/29/2011   . SPINE SURGERY  09/21    Lumbar-sacral fusion   . TRANSURETHRAL RESECTION OF PROSTATE          PT Acute     Row Name 11/10/21 1300          Type of Visit    Type of Physical Therapy note Physical Therapy Daily Treatment Note     Row Name 11/10/21 1300          Treatment Precautions/Restrictions    Precautions/Restrictions Fall;Postsurgical/procedural;Spine     Fall Socks/charm     Other Precautions/Restrictions Information TLSO OOB, spine precautions, drain     Row Name  11/10/21 1300          Subjective    Subjective Information Patient received sitting EOB     Patient status Patient agreeable to treatment;Nursing in agreement for treatment;Patient pain control adequate to participate in therapy     [place] Name 11/10/21 1300          Pain Assessment    Pain Asssessment Tool FACES Scale     Row Name 11/10/21 1300          FACES Scale    FACES Scale Face 4- hurts a little more     Pain Location back     Row Name 11/10/21 1300          Objective    Overall Cognitive Status Impaired     Other  Cognitive Status Information oriented to name and DOB but decreased safety awareness with difficulty  following recommendations and easily distracted     Communication No communication limitations or impairments noted. Current status of hearing, speech and vision allow functional communication.     Coordination/Motor control No limitations or impairments noted. Movement patterns are fluid and coordinated throughout     Balance Balance limitations present     Static Sitting Balance Normal - able to maintain steady balance without handhold support     Dynamic Sitting Balance Good - accepts moderate challenge, able to maintain balance while picking object off floor     Static Standing Balance Good - able to maintain balance without handhold support, limited postural sway     Dynamic Standing Balance Fair - accepts minimal challenge, able to maintain balance while turning head/trunk     Other Balance Information Able to maintain static standing balance without UE support     Extremity Assessment Range of motion, strength,  muscle tone and/or sensation limitations present     LUE findings WNL     RUE findings WNL     LLE findings LLE strength grossly >=3/5 throughout     RLE findings RLE strength grossly >=3/5 throughout     Functional Mobility Functional mobility deficits present     Transfers to/from Stand Supervised     Transfer Comments STS with and without use of FWW with use of chair or hand rail      Gait Supervised     Gait Comments Patient demonstrates forward flexed posture with cues for hip and shoulder extension to improve upright posture with difficulty maintaining.     Device used for [employer] used Comments cues for proper postioning within [employer]     Ambulation Distance 457f     Other Objective Findings Reviewed spine precautions with patient requiring adjustment of brace with incorrect placement of straps.     Patient educated on therex including seated BLE hip flexion and LAQ and standing marching and mini squats with UE support.                       Eval cont.     [place] 11/10/21 1300          [place] AM-PAC: Basic Mobility    Assistance Needed to Turn from Back to Side While in a Flat Bed Without Using Bedrails 4 - None (independent)     Difficulty with Supine to Sit Transfer 4 - None (independent)     How Much Help Needed to Move to/from Bed to Chair 4 - None (independent)     Difficulty with Sit to Stand Transfer from Chair with Arms 4 - None (independent)     How Much Help Needed to Walk in Room 3 - A little (supervised/min assist)     How Much Help Needed to Climb 3-5 Steps with a Rail 3 - A little (supervised/min assist)     AMPAC Total Score 22     Assessment: AM-PAC Basic Mobility Impairment Rating Score 19-22 - 20-39% impaired     Row Name 11/10/21 1300          Patient/Family Education    Learner(s) Patient     Learner response to rehab patient education interventions Verbalizes understanding;Able to return demonstrate teaching;Needs reinforcement     Patient/family training comments role of PT, PT plan of care, spine precautions, properly wearing TLSO, importance of mobility and HEP     Row Name 11/10/21 1300  Assessment    Assessment Patient did well with PT today with ability to ambulate 468f with supervision and FWW however slightly impulsive with decreased safety awareness. Patient is presenting below his functional baseline and  would benefit from continued therapy to maximize functional mobility, balance, strength and safety. Currently recommend supervision, use of a FWW for ambulation and ongoing therapy with ability to discharge home once medically ready. Inpatient PT will continue to follow and progress while in the hospital updating recommendations and d/c plan as needed with patient encouraged to ambulate 2-3x/day daily.     Rehab Potential Good     Row Name 11/10/21 1300          Patient stated Goal    Patient stated goal to walk     Row Name 11/10/21 1300          Goal 1 (Short Term)    Impairment Gait impairment     Custom goal Patient will require no more than supervision for ambulation x200+ feet with FWW     Goal Status Met     Row Name 11/10/21 1300          Goal 2 (Short Term)    Impairment Balance impairment     Balance To improve postural control and safety in standing, patient able to maintain static standing balance without handhold support and limited postural sway     Goal Status Met     Row Name 11/10/21 1300          Goal 3 (Short Term)    Impairment Education need     Custom goal Patient will be ind to don/doff TLSO brace correcntly     Number of visits 2-3     Goal Status New     Row Name 11/10/21 1300          Goal 4 (Short Term)    Impairment Safety/judgement impairment     Safety/judgement Patient able to verbalize good understanding of presented safety recommendations     Number of visits 2-3     Goal Status [place] 11/10/21 1300          Planned Therapy Interventions and Rationale    Gait Training to normalize gait pattern and improve safety while ambulating;to normalize gait pattern and improve safety while ambulating with assistive device     Neuromuscular Re-Education to improve safety during dynamic activities     Orthotic/Prosthetic Assessment and Training to improve independence with donning/doffing device and skin care;to increase knowledge of proper wear and care of orthotic/splint      Therapeutic Activities to improve functional mobility and ability to navigate in the home and/or community     Theraputic Exercise to increase strength to allow greater independence with functional mobility skills;to improve activity tolerance to allow greater independence with functional mobility skills     Row Name 11/10/21 1300          Treatment Plan Disussion    Treatment Plan Discussion and Agreement Patient/family/caregiver stated understanding and agreement with the therapy plan     Row Name 11/10/21 1300          Treatment Plan    Continue therapy to address Decline in functional ability/mobility     Frequency of treatment 7 times per week     Duration of treatment (number of visits) While patient is hospitalized and in need of skilled therapy services     Status of treatment Patient evaluated and  will benefit from ongoing skilled therapy     Row Name 11/10/21 1300          Patient Safety Considerations    Patient safety considerations Patient left sitting at end of treatment;Call light left in reach and fall precautions in place     Patient assistive device requirements for safe ambulation [place] Name 11/10/21 1300          Therapy Plan Communication    Therapy Plan Communication Discussed therapy plan and patient's mobility status with Case Manager;Discussed therapy plan with Nursing and/or Physician;Encouraged out of bed with assistance by     Encouraged out of bed with assistance by Nursing;Staff     Row Name 11/10/21 1300          Physical Therapy Patient Discharge Instructions    Your Physical Therapist suggests the following Continue to complete your home exercise program daily as instructed;Continue to follow your prescribed mobility precautions when moving in and out of bed and walking  as instructed;Continue to use correct body mechanics when moving in and out of bed as instructed;Supervision with walking is suggested for increased safety;Continue to use your assistive device as  instructed when walking to improve your stability and prevent falls     Row Name 11/10/21 1300          Therapeutic Procedures    Gait Training [phone]) Assistive device training;Gait pattern analysis and treatment of deviations;Patient education;Postural alighnment/biomechanic training during gait;Weight shift and postural control activities during gait        Total TIMED Treatment (min)  15     Therapeutic exercise  ([id number])  Home Exercise Program (HEP) demonstration and performance;Patient education;Strengthening exercises        Total TIMED Treatment (min)  15     Orthotic Mgmt/Training, initial encounter, each 15 minutes ([id number]) Patient education;Wear time instruction;Orthotic/splint fitting        Total TIMED Treatment (min)  15     Row Name 11/10/21 1300          Treatment Time     Total TIMED Treatment  (min) 45     Total Treatment Time (min) 45     Treatment start time 1000               Post Acute Discharge Recommendations  Discharge Rehabilitation Recommendations : If available, recommend discharge to supervised living situation;Patient requires assistance;If available, patient would benefit from ongoing therapy  Deficits: Mobility ADLs;Self-Care ADLs  Level of Care : Home with assitance  Therapy type : Home health  Therapy Justification: Home safety assessment needs;Mobility ADL training needs  Current Level of Assistance : Supervised  Equipment recommendations: No equipment needed - patient has own equipment    The physical therapist of record is endorsed by evaluating physical therapist.

## 2021-11-10 NOTE — Plan of Care (Signed)
Problem: Promotion of Health and Safety  Goal: Promotion of Health and Safety  Description: The patient remains safe, receives appropriate treatment and achieves optimal outcomes (physically, psychosocially, and spiritually) within the limitations of the disease process by discharge.    Information below is the current care plan.  Outcome: Progressing  Flowsheets  Taken 11/10/2021 0000 by Deliah [place], RN  Individualized Interventions/Recommendations #1: Assess for pain using Numeric Scale. Administer prn pain med as ordered  Individualized Interventions/Recommendations #2 (if applicable): TLSO when OOB  Individualized Interventions/Recommendations #3 (if applicable): Administer IV abx as ordered  Individualized Interventions/Recommendations #4 (if applicable): Provide safe and restful environment  Outcome Evaluation (rationale for progressing/not progressing) every shift: Patient resting in his room. Complained of back pain. PRN pain med given as ordered. Back & L side chest  incision covered with mepilex C/D/I. VS stable.  Taken 11/09/2021 2000 by Deliah [place], RN  Patient /Family stated Goal: sleep  Taken 11/07/2021 0119 by Lovie Macadamia, RN  Guidelines: Inpatient Nursing Guidelines  Note:

## 2021-11-11 ENCOUNTER — Other Ambulatory Visit: Payer: Self-pay | Admitting: Cardiovascular Disease

## 2021-11-11 ENCOUNTER — Other Ambulatory Visit: Payer: Self-pay

## 2021-11-11 LAB — COMPREHENSIVE METABOLIC PANEL, BLOOD
ALT (SGPT): 8 U/L (ref 0–41)
AST (SGOT): 17 U/L (ref 0–40)
Albumin: 3.3 g/dL — ABNORMAL LOW (ref 3.5–5.2)
Alkaline Phos: 62 U/L (ref 40–129)
Anion Gap: 10 mmol/L (ref 7–15)
BUN: 23 mg/dL (ref 8–23)
Bicarbonate: 25 mmol/L (ref 22–29)
Bilirubin, Tot: 0.59 mg/dL (ref <1.2)
Calcium: 8.3 mg/dL — ABNORMAL LOW (ref 8.5–10.6)
Chloride: 104 mmol/L (ref 98–107)
Creatinine: 1.04 mg/dL (ref 0.67–1.17)
Glucose: 120 mg/dL — ABNORMAL HIGH (ref 70–99)
Potassium: 4.5 mmol/L (ref 3.5–5.1)
Sodium: 139 mmol/L (ref 136–145)
Total Protein: 5.4 g/dL — ABNORMAL LOW (ref 6.0–8.0)
eGFR Based on CKD-EPI 2021 Equation: >60 mL/min

## 2021-11-11 LAB — CBC WITH DIFF, BLOOD
ANC-Automated: 8.2 10*3/uL — ABNORMAL HIGH (ref 1.6–7.0)
Abs Basophils: 0 10*3/uL (ref <0.2)
Abs Eosinophils: 0 10*3/uL (ref 0.0–0.5)
Abs Lymphs: 2.1 10*3/uL (ref 0.8–3.1)
Abs Monos: 1.2 10*3/uL — ABNORMAL HIGH (ref 0.2–0.8)
Basophils: 0 %
Eosinophils: 0 %
Hct: 31.3 % — ABNORMAL LOW (ref 40.0–50.0)
Hgb: 10.2 gm/dL — ABNORMAL LOW (ref 13.7–17.5)
Imm Gran %: 1 % — ABNORMAL HIGH (ref <1)
Imm Gran Abs: 0.1 10*3/uL — ABNORMAL HIGH (ref <0.1)
Lymphocytes: 18 %
MCH: 30.4 pg (ref 26.0–32.0)
MCHC: 32.6 g/dL (ref 32.0–36.0)
MCV: 93.2 um3 (ref 79.0–95.0)
MPV: 10.2 fL (ref 9.4–12.4)
Monocytes: 10 %
Plt Count: 155 10*3/uL (ref 140–370)
RBC: 3.36 10*6/uL — ABNORMAL LOW (ref 4.60–6.10)
RDW: 13.7 % (ref 12.0–14.0)
Segs: 70 %
WBC: 11.7 10*3/uL — ABNORMAL HIGH (ref 4.0–10.0)

## 2021-11-11 MED ORDER — DOCUSATE SODIUM 100 MG OR CAPS
100.0000 mg | ORAL_CAPSULE | Freq: Three times a day (TID) | ORAL | 0 refills | Status: DC
Start: 2021-11-11 — End: 2021-11-23
  Filled 2021-11-11: qty 15, 5d supply, fill #0

## 2021-11-11 MED ORDER — BISACODYL 10 MG RE SUPP
10.0000 mg | Freq: Once | RECTAL | Status: AC
Start: 2021-11-11 — End: 2021-11-11
  Administered 2021-11-11 (×2): 10 mg via RECTAL
  Filled 2021-11-11: qty 1

## 2021-11-11 MED ORDER — LACTULOSE ENEMA RE SOLN
300.0000 mL | Freq: Once | Status: DC
Start: 2021-11-11 — End: 2021-11-12
  Filled 2021-11-11: qty 300

## 2021-11-11 MED ORDER — OXYCODONE HCL 5 MG OR TABS
5.0000 mg | ORAL_TABLET | ORAL | 0 refills | Status: DC | PRN
Start: 2021-11-11 — End: 2021-11-23
  Filled 2021-11-11: qty 50, 9d supply, fill #0

## 2021-11-11 MED ORDER — FAMOTIDINE 20 MG OR TABS
20.0000 mg | ORAL_TABLET | Freq: Two times a day (BID) | ORAL | 0 refills | Status: DC
Start: 2021-11-11 — End: 2021-12-01
  Filled 2021-11-11: qty 60, 30d supply, fill #0

## 2021-11-11 NOTE — Interdisciplinary (Signed)
Physical Therapy Daily Treatment Note    Admitting Physician:  Zlomislic, Vinko, MD  Admission Date 11/04/2021    Inpatient Diagnosis:   Problem List       Codes    Pain     ICD-10-CM: R52  ICD-9-CM: 780.96    Relevant Orders    Consult/Referral to Primary Care    Decreased functional mobility     ICD-10-CM: R26.89  ICD-9-CM: 781.99    Spinal stenosis of lumbar region with radiculopathy     ICD-10-CM: M48.061, M54.16  ICD-9-CM: 724.02, 724.4    Spondylolisthesis of lumbar region     ICD-10-CM: M43.16  ICD-9-CM: 738.4    DDD (degenerative disc disease), lumbar     ICD-10-CM: M51.36  ICD-9-CM: 722.52    Decreased activities of daily living (ADL)     ICD-10-CM: Z78.9  ICD-9-CM: V49.89          IP Start of Service   Start of Care: 11/09/21  Onset Date: 11/04/21  Reason for referral: Decline in functional ability/mobility    Preferred Language:English         Past Medical History:   Diagnosis Date    Chronic back pain     Congenital hydronephrosis     Gout     Headache     Hematuria     Kidney disease     Kidney stones     Major depressive disorder, single episode     Polyarthropathy or polyarthritis of multiple sites     Retinal detachment     Urethral stricture       Past Surgical History:   Procedure Laterality Date    CT INSERTION OF SUPRAPUBIC CATH  09/25/2015    NEPHRECTOMY Right 1995    APPENDECTOMY      COLONOSCOPY      CYSTOSCOPY      CYSTOSCOPY W/ LASER LITHOTRIPSY      OTHER SURGICAL HISTORY      Interstim 01/29/2011    SPINE SURGERY  09/21    Lumbar-sacral fusion    TRANSURETHRAL RESECTION OF PROSTATE          PT Acute     Row Name 11/11/21 0900          Type of Visit    Type of Physical Therapy note Physical Therapy Daily Treatment Note     Row Name 11/11/21 0900          Treatment Precautions/Restrictions    Precautions/Restrictions Fall;Postsurgical/procedural;Spine     Fall Socks/charm     Other Precautions/Restrictions Information TLSO OOB, spine precautions, drain     Row Name  11/11/21 0900          Medical History    History of presenting condition Patient is a 70 year old male s/p revision lateral interbody fusion L2-3     Fall history No falls reported in the last 6 months     [place] Name 11/11/21 0900          Functional History    Prior Level of Function Minimal deficits     Equipment required for mobility in the home [place]     Other Functional History Information uses cane as needed, owns [place] Name 11/11/21 0900          Social History    Living Situation Lives with parent/family     [place] accessibility  Accessible with wheelchair or walker  Other Social History Information Pt lives with his sister and brother in [place]. Sister able to assist as needed upon d/c     [place] Name 11/11/21 0900          Subjective    Subjective Information "I think I over did it yesterady"     Patient status Patient agreeable to treatment;Nursing in agreement for treatment;Patient pain control adequate to participate in therapy     [place] Name 11/11/21 0900          Pain Assessment    Pain Asssessment Tool Numeric Pain Rating Scale     Row Name 11/11/21 0900          Numeric Pain Rating Scale    Pain Intensity - rating at present 9     Pain Intensity- rating after treatment 8     Location low back and RLE     Row Name 11/11/21 0900          Objective    Overall Cognitive Status Intact - no cognitive limitations or impairments noted     Other  Cognitive Status Information A&O x 4, increased processing time     Communication No communication limitations or impairments noted. Current status of hearing, speech and vision allow functional communication.     Coordination/Motor control No limitations or impairments noted. Movement patterns are fluid and coordinated throughout     Balance Balance limitations present     Static Sitting Balance Normal - able to maintain steady balance without handhold support     Dynamic Sitting Balance Good - accepts moderate challenge, able to maintain  balance while picking object off floor     Static Standing Balance Good - able to maintain balance without handhold support, limited postural sway     Dynamic Standing Balance Fair - accepts minimal challenge, able to maintain balance while turning head/trunk     Other Balance Information standing balance enhanced with FWW, close SBA for stnading balance without AD     Functional Mobility Functional mobility deficits present     Bed Mobility Supervised     Bed Mobility Comments SUPV for bed mobility without bed features, cues for log roll sequencing     Transfers to/from Stand Supervised     Transfer Comments SUPV for STS and stand step transfers with FWW, cues for hand placement and AD management     Gait Supervised     Gait Comments SUPV for gait training with FWW 150'x1, Pt demo'd steady step through gait pattern, cues for maintain closer proximity within AD and upright standing posture with fair return     Device used for ambulation/mobility Front wheeled walker     Ambulation Distance 150'     Other Objective Findings Pt received in supine and agreeable to PT treatment. Pt able to state 3/3 spine precautions and demo'd goog complianace throughout. Pt able to don/dof TLSO brace independently with SUPV. Pt educated on donning brace for all OOB activities- pt verbalized understnading. Skilled interventions focused on safety with bed mobility, transfer training and gait training with FWW. Pt asymaptomtic throughout PT session with VSS. Pt left on toilet, instructed on using call light when finished with toileting task, RN aware.                      Eval cont.     [place] Name 11/11/21 0900          [place] AM-PAC: Basic Mobility    Assistance  Needed to Turn from Back to Side While in a Flat Bed Without Using Bedrails 4 - None (independent)     Difficulty with Supine to Sit Transfer 4 - None (independent)     How Much Help Needed to Move to/from Bed to Chair 4 - None (independent)     Difficulty with Sit to Stand  Transfer from Chair with Arms 4 - None (independent)     How Much Help Needed to Walk in Room 3 - A little (supervised/min assist)     How Much Help Needed to Climb 3-5 Steps with a Rail 3 - A little (supervised/min assist)     AMPAC Total Score 22     Assessment: AM-PAC Basic Mobility Impairment Rating Score 19-22 - 20-39% impaired     Row Name 11/11/21 0900          Patient/Family Education    Learner(s) Patient     Learner response to rehab patient education interventions Verbalizes understanding;Able to return demonstrate teaching;Needs reinforcement     Patient/family training comments role of PT, PT plan of care, spine precautions, properly wearing TLSO, importance of mobility and HEP     Row Name 11/11/21 0900          Assessment    Assessment Pt making steady progress with PT and demo'd good ability to don/dof TLSO brace. Pt overall SUPV for all OOB mobility and is safest with FWW at this time. Pt has met all IPPT goals and will be cleared from caseload. Once medically stable, recommend D/C to home with family assistance, FWW for all OOB mobility and HHPT if available.     Rehab Potential Good     Row Name 11/11/21 0900          Patient stated Goal    Patient stated goal to have a BM     Row Name 11/11/21 0900          Goal 1 (Short Term)    Custom goal Patient will require no more than supervision for ambulation x200+ feet with FWW     Goal Status Completed;Met     Row Name 11/11/21 0900          Goal 2 (Short Term)    Balance To improve postural control and safety in standing, patient able to maintain static standing balance without handhold support and limited postural sway     Goal Status Completed;Met     Row Name 11/11/21 0900          Goal 3 (Short Term)    Custom goal Patient will be ind to don/doff TLSO brace correcntly     Goal Status Completed;Met     Row Name 11/11/21 0900          Goal 4 (Short Term)    Safety/judgement Patient able to verbalize good understanding of presented safety  recommendations     Goal Status Completed;Met     Row Name 11/11/21 0900          Planned Therapy Interventions and Rationale    Gait Training to normalize gait pattern and improve safety while ambulating;to normalize gait pattern and improve safety while ambulating with assistive device     Neuromuscular Re-Education to improve safety during dynamic activities     Orthotic/Prosthetic Assessment and Training to improve independence with donning/doffing device and skin care;to increase knowledge of proper wear and care of orthotic/splint     Therapeutic Activities to improve functional mobility and ability to   navigate in the home and/or community     Theraputic Exercise to increase strength to allow greater independence with functional mobility skills;to improve activity tolerance to allow greater independence with functional mobility skills     Row Name 11/11/21 0900          Treatment Plan Disussion    Treatment Plan Discussion and Agreement Patient/family/caregiver stated understanding and agreement with the therapy plan     Row Name 11/11/21 0900          Treatment Plan    Frequency of treatment Patient appropriate for discharge from therapy     Status of treatment Patient appropriate for discharge from therapy     [place] Name 11/11/21 0900          Patient Safety Considerations    Patient safety considerations Patient left sitting at end of treatment;Call light left in reach and fall precautions in place     Patient assistive device requirements for safe ambulation [place] Name 11/11/21 0900          Therapy Plan Communication    Therapy Plan Communication Discussed therapy plan and patient's mobility status with Case Manager;Discussed therapy plan with Nursing and/or Physician;Encouraged out of bed with assistance by     Encouraged out of bed with assistance by Nursing;Staff     Row Name 11/11/21 0900          Physical Therapy Patient Discharge Instructions    Your Physical Therapist suggests the following  Continue to complete your home exercise program daily as instructed;Continue to follow your prescribed mobility precautions when moving in and out of bed and walking  as instructed;Continue to use correct body mechanics when moving in and out of bed as instructed;Supervision with walking is suggested for increased safety;Continue to use your assistive device as instructed when walking to improve your stability and prevent falls     Row Name 11/11/21 0900          Therapeutic Procedures    Gait Training [phone]) Assistive device training;Gait pattern analysis and treatment of deviations;Patient education;Postural alighnment/biomechanic training during gait;Weight shift and postural control activities during gait        Total TIMED Treatment (min)  10     Therapeutic Activities (28315)  Assistance/facilitation of bed mobility;Patient education;Progressive mobilization to improve functional independence;Transfer training with weight shift and direction change        Total TIMED Treatment (min)  20     Row Name 11/11/21 0900          Treatment Time     Total TIMED Treatment  (min) 30     Total Treatment Time (min) 30     Treatment start time 0915               Post Acute Discharge Recommendations  Discharge Rehabilitation Recommendations : If available, recommend discharge to supervised living situation;Patient requires assistance;If available, patient would benefit from ongoing therapy  Deficits: Mobility ADLs;Self-Care ADLs  Level of Care : Home with assitance  Therapy type : Home health  Therapy Justification: Home safety assessment needs;Mobility ADL training needs  Current Level of Assistance : Supervised  Equipment recommendations: No equipment needed - patient has own equipment  Walker Justification: Patient safety and mobility is enhanced by the use of a walker. Patient has indicated agreement to utilize the walker during Mobility Related Activities of Daily Living (MRADLs) and is able to complete MRADLs in a  more timely manner using  a walker.    The physical therapist of record is endorsed by evaluating physical therapist.

## 2021-11-11 NOTE — Discharge Summary (Signed)
Patient Name:  Stephen [name]    Principal Diagnosis (required):    Lumbar spinal stenosis    Hospital Problem List (required):  Patient Active Problem List   Diagnosis    Post-traumatic stricture of anterior urethra    Bladder neck contracture    Lumbar radiculopathy    Cervical radiculopathy    Incomplete bladder emptying    Lumbosacral spondylosis without myelopathy    Chronic SI joint pain    Degenerative spondylolisthesis    Lumbar stenosis with neurogenic claudication    Pre-procedure lab exam    Cervical spondylosis without myelopathy    Cervical spondylosis    S/P lumbar fusion    Chronic left SI joint pain    Chronic midline thoracic back pain    Spondylolisthesis at L5-S1 level    Left renal mass    Malignant neoplasm of urinary bladder, unspecified site (CMS-HCC)    Dorsopathy    Ureter malignant neoplasm, left (CMS-HCC)    Cauda equina compression (CMS-HCC)       Additional Hospital Diagnoses ("rule out" or "suspected" diagnoses, etc.):  None    Principal Procedure During This Hospitalization (required):  11/08/21  XLIF L2-L3  PSIF L2-S1    Other Procedures Performed During This Hospitalization (required):  None    Consultations Obtained During This Hospitalization:  PT  OT  CM  SW       Key consultant recommendations:  Pain management  Physical therapy regimen  Discharge disposition      Reason for Admission to the Hospital / History of Present Illness:  Per preop H&P  Stephen [name] is a 70 year old male with PMH notable for above spine surgery, solitary kidney, transurethral resection of the prostate recurrent bladder neck contracture, and 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 Does not use cane or walker at baseline (before a couple days ago). Pain started RIGHT medial knee on Tues/Wed this week and now involves the whole right anterior/medial thigh. Severe constant  throbbing/burning pain.      I asked the patient to be VERY specific about his urinary symptoms. He says he is urinating the exact same amount at the exact same times of the day. Not straining to pee. No hesitancy. He thinks he is peeing exactly the same today as he was a week ago. He denies numbness around the penis, testes, anus; no saddle anesthesia. Sometimes he says it feels 'weird' to have a bowel movement, maybe 'loose' but he denies frank incontinence or is unwilling to endorse.     Cramping in LEFT calf and ankle is chronic. Hx of BLE edema, unsure if venous stasis or lymphedema.     PVR in ED with >927ml, patient voided 44ml, bladder scan again >928ml  Then straight cath performed with 1567ml output    Hospital Course by Problem (required):  The patient presented to the preoperative holding area on day of surgery and admission. The patient was taken to the operating room for the planned operation. The operation proceeded uneventfully and without complication - please see operative details for report.  The patient was transferred to the PACU.     Post-operatively the patient was admitted to the orthopedic floor.  Postoperative care included:  Foley removal on POD#1  Mechanical DVT ppx  PO pain medication, IV steroids,   24 hours of prophylactic antibiotics  Working with PT and OT, and  use of a brace    A hemogram was also performed and was consistent with expected acute blood loss anemia; the patient remained asymptomatic and stable throughout the hospitalization.      Patient was ready for Discharge on POD#5.    On the day of discharge the patient was medically stable, urinating without difficulty, tolerating oral diet, pain well controlled, and was cleared for discharge by PT/OT. Patient will have HHPT and RN evaluation. The patient was given strict return precautions, wound care instructions, and activity restrictions and demonstrated clear understanding.       Patient will follow-up in 2 weeks in  clinic.    Tests Outstanding at Discharge Requiring Follow Up:  None    Discharge Condition (required):  Stable    Key Physical Exam Findings at Discharge:  Bilateral Lower Extremity              Motor:                                         Right           Left                   Iliopsoas (L2/3)                     5/5              5/5                            Quadriceps (L4)                    5/5              5/5                   Tibialis Anterior (L4/5)           5/5             5/5                   EHL (L5)                               5/5              5/5                   GSC (S1)                              5/5              5/5              Sensation:                   SILT in L2-S1 distributions.              Special Tests:                   Downgoing plantar response. No clonus.               Vascular Exam:  Warm and well perfused distally    Discharge Diet:  Regular.      Discharge Medications:     What To Do With Your Medications        START taking these medications        Add'l Info   docusate sodium 100 MG capsule  Commonly known as: COLACE  Take 1 capsule (100 mg) by mouth 3 times daily for 5 days.   Quantity: 15 capsule  Refills: 0     famotidine 20 MG tablet  Commonly known as: PEPCID  Take 1 tablet (20 mg) by mouth every 12 hours.   Quantity: 60 tablet  Refills: 0            CHANGE how you take these medications        Add'l Info   oxyCODONE 5 MG immediate release tablet  Commonly known as: ROXICODONE  Take 1 to 2 tablets by mouth every 3 hours as needed for Moderate Pain or Severe Pain (Pain Score 7-10).   Quantity: 50 tablet  Refills: 0  What changed:   when to take this  reasons to take this            CONTINUE taking these medications        Add'l Info   [place] 0.025 % ophthalmic solution  INSTILL 1 DROP INTO BOTH EYES TWICE A DAY AS DIRECTED  Generic drug: ketotifen   Refills: 0     allopurinol 300 MG tablet  Commonly known as: ZYLOPRIM  Take 300 mg by mouth daily.    Refills: 0     amLODIPINE 5 MG tablet  Commonly known as: NORVASC  Take 10 mg by mouth daily.   Refills: 0     ascorbic acid 500 MG/5ML suspension  Commonly known as: VITAMIN C   Refills: 0     DULoxetine 20 MG CR capsule  Commonly known as: CYMBALTA  Take 1 capsule (20 mg) by mouth daily.   Quantity: 90 capsule  Refills: 3     Lidocaine Pain Relief 4 % patch  Apply 1 patch topically every 24 hours. Leave patch on for 12 hours, then remove for 12 hours.  Generic drug: lidocaine   Quantity: 15 patch  Refills: 0     LORazepam 1 MG tablet  Commonly known as: ATIVAN  Take 1 tablet (1 mg) by mouth every 6 hours as needed for Anxiety.   Quantity: 2 tablet  Refills: 0     naloxone 4 mg/0.1 mL nasal spray  Commonly known as: NARCAN  For suspected opioid overdose, call 911! Then spray once in one nostril. Repeat after 3 minutes if no or minimal response using a new spray in other nostril.   Quantity: 2 each  Refills: 0     pregabalin 100 MG capsule  Commonly known as: LYRICA  TAKE 1 CAPSULE BY MOUTH EVERY NIGHT AT BEDTIME   Quantity: 30 capsule  Refills: 0     senna 8.6 MG tablet  Commonly known as: SENOKOT  Take 1 tablet (8.6 mg) by mouth daily.   Quantity: 30 tablet  Refills: 0     SYSTANE ULTRA OP   Refills: 0     triamcinolone 0.1 % cream  Commonly known as: KENALOG  Apply 1 Application topically 2 times daily. Apply a thin layer as directed   Refills: 0     VITAMIN D PO  Take 125 mg by mouth daily.   Refills: 0  zolpidem 5 MG tablet  Commonly known as: AMBIEN  Take 1 tablet (5 mg) by mouth nightly as needed for Insomnia.   Quantity: 30 tablet  Refills: 0            STOP taking these medications      acetaminophen 325 MG tablet  Commonly known as: TYLENOL     methylPREDNISolone 4 MG tablet  Commonly known as: MEDROL DOSEPACK               Where to Get Your Medications        These medications were sent to [facility] [id number], [place] [address]      Hours: Mon-Fri:  8:30am-7:00pm; Sat-Sun & Holidays: 9:00am-5:00pm Phone: [phone]   docusate sodium 100 MG capsule  famotidine 20 MG tablet  oxyCODONE 5 MG immediate release tablet         Allergies:  Amoxicillin and Sulfa drugs    Discharge Disposition:  Home.    Discharge Code Status:  Full code / full care  This code status is not changed from the time of admission.    Follow Up Appointments:    Scheduled appointments:  No future appointments.    For appointments requested for after discharge that have not yet been scheduled, refer to the Post Discharge Referrals section of the After Visit Summary.    Discharging 25 Contact Information:  [facility] operator at [phone].

## 2021-11-11 NOTE — Progress Notes (Signed)
Orthopedics Spine Progress Note  11/11/2021    Patient ID:  Name: Stephen [name]  MRN: [id number]  DOB: [birth year]    Procedures:  11/08/21  XLIF L2-L3  PSIF L2-S1    Subjective:  Doing well  Reporting mod back/surg site pain  Leg pain resolved though endorsing right knee pain due to "overdoing it yesterday"    Objective:  Vital Signs:  BP (!) 98/54 (BP Location: Left arm, BP Patient Position: Sitting)   Pulse 62   Temp 98.8 F (37.1 C)   Resp 18   Ht 5\' 6"  (1.676 m)   Wt 91.2 kg (201 lb 1 oz)   SpO2 97%   BMI 32.45 kg/m     Physical Exam:  General: patient awake, alert, and responding to commands; no apparent distress  Cardio: regular rate and rhythm per pulse  Respiratory: patient breathing comfortably without use of accessory muscles  Neck/Back/Abdomen: dressings in place, clean/dry/intact, Drain in place, output ss      Bilateral Lower Extremity   Motor:       Right           Left        Iliopsoas (L2/3)                     5/5           5/5         Quadriceps (L4)                    5/5           5/5        Tibialis Anterior (L4/5)           5/5           5/5        EHL (L5)                               5/5           5/5        GSC (S1)                              5/5           5/5   Sensation:        SILT in L2-S1 distributions.   Special Tests:        Downgoing plantar response. No clonus.    Vascular Exam:        Warm and well perfused distally    Laboratory Data:   Lab Results   Component Value Date    WBC 11.7 (H) 11/11/2021    HGB 10.2 (L) 11/11/2021    HCT 31.3 (L) 11/11/2021    PLT 155 11/11/2021     Lab Results   Component Value Date    NA 139 11/11/2021    K 4.5 11/11/2021    CL 104 11/11/2021    BICARB 25 11/11/2021    BUN 23 11/11/2021    CREAT 1.04 11/11/2021    GLU 120 (H) 11/11/2021    [place] 8.3 (L) 11/11/2021     No results found for: INR, PTT      Input/Output:    Intake/Output Summary (Last 24 hours) at 11/11/2021 0741  Last data filed at 11/11/2021 0543  Gross per 24 hour  Intake 600  ml   Output 590 ml   Net 10 ml       Current Medications:  Current Facility-Administered Medications   Medication   . acetaminophen (TYLENOL) tablet 975 mg   . allopurinol (ZYLOPRIM) tablet 300 mg   . amLODIPINE (NORVASC) tablet 10 mg   . bisacodyl (DULCOLAX) suppository 10 mg   . bisacodyl (DULCOLAX) suppository 10 mg    Followed by   . bisacodyl (DULCOLAX) suppository 10 mg   . ceFAZolin in dextrose (ANCEF) IVPB 2,000 mg   . docusate sodium (COLACE) capsule 100 mg   . DULoxetine (CYMBALTA) CR capsule 20 mg   . famotidine (PEPCID) tablet 20 mg   . lidocaine (ASPERCREME) 4 % patch 1 patch   . LORazepam (ATIVAN) tablet 1 mg   . LORazepam (ATIVAN) tablet 1 mg   . magnesium hydroxide (MILK OF MAGNESIA) suspension 30 mL   . ondansetron (ZOFRAN) injection 4 mg   . oxyCODONE (ROXICODONE) tablet 5 mg    Or   . oxyCODONE (ROXICODONE) tablet 10 mg   . oxyCODONE (ROXICODONE) tablet 5 mg   . polyethylene glycol (MIRALAX) packet 17 g   . pregabalin (LYRICA) capsule 100 mg   . senna (SENOKOT) tablet 17.2 mg       Assessment:   70 year old male 3 Days Post-Op s/p above procedures. Patient doing well and progressing appropriately.   - Acute Blood Loss Anemia with component of hemodilution    Plan:  - PT: OOBTC w/ TLSO  - Antibiotics: complete peri-operative   - DVT Prophylaxis: no pharmacologic prophylaxis; SCDs/TED hose, ambulation  - Pain Medication: multimodal   - Diet: regular  - Post op imaging: uprights complete  - Drain: to gravity currently   - Foley: dc  - Other: aggressive incentive spirometry  - Dispo: pending , PT clearance, pain control on oral pain medication, CM clearance, PT clearance (need for continued inpatient physical therapy evaluation to assess patient's mobility, in order to ensure a safe dispo plan)    Bonnetta Barry, MD  Plan discussed with fellow surgeon, Dr. Lovena Le and attending surgeon, Dr. Zlomislic        Please page the Orthopaedic Spine Surgery Team with questions or concerns based on patient  location:     Established spine floor patients at [employer]: Ortho Spine 1 - [phone]      Established spine floor patients at [facility]: Ortho Spine 2 - [phone]      New spine consultations not yet followed by spine team:    View [facility] on call for "SPINE CONSULT" call schedule (Orthopaedic Spine versus Neurosurgery)   If Orthopaedic Spine is on call please page the PRIMARY CONSULT RESIDENT on call:   [place]: ORTHOPEDICS/TH   [place]: ORTHOPEDICS/HC

## 2021-11-11 NOTE — Interdisciplinary (Signed)
Nutrition Note     Note Type: Initial Screening      HPI Per MD: 70 year old male 2 Days Post-Op s/p above procedures. Patient doing well and progressing appropriately.   - Acute Blood Loss Anemia with component of hemodilution    Nutrition Summary:  Current Diet Rx: Diet Regular  Nutritional Supplement - Oral ERAS: Gum; Deliver Supplements: TID  Source of Information: Chart Review;Spoke with patient  Barriers to Intake: None: good appetite & oral intake  Additional Comments: Pt reports good appetite and PO. Data shows: good PO.  Percentage of Meals Eaten: >75% of meals  Tray Items Taken for the past 168 hrs:   Number of Items Taken Number of Items on Tray Diet Tolerance   11/05/21 0935 4 4 Tolerates   11/05/21 1444 3 3 Tolerates   11/05/21 2000 -- -- Tolerates   11/06/21 0920 4 4 Tolerates   11/06/21 1422 3 3 Tolerates   11/06/21 1757 4 4 Tolerates   11/06/21 2000 3 4 Tolerates   11/07/21 0847 3 3 Tolerates   11/07/21 1203 3 3 Tolerates   11/07/21 1940 -- -- Tolerates   11/09/21 0815 3 3 Tolerates   11/09/21 1322 3 3 Tolerates   11/10/21 0800 4 4 Tolerates   11/10/21 1200 4 4 Tolerates   11/10/21 1700 4 4 Tolerates     No data found.    Anthropometrics:  Height: '5\' 6"'  (167.6 cm)  Weight For Nutrition Equations: 88.5 kg (195 lb) (most recent wt via bed scale 11/17)     Ideal Body Weight (kg): 64.36  Percent of Ideal Body Weight: 137.43 %  BMI for Nutrition Calculations: 31.47  Usual Body Weight (Dietary): 91.2 kg (201 lb) (per review)  Change from UBW (%): -2.99 %     Weight Hx: No sig wt changes. Pt denies any wt changes.  Wt Readings from Last 20 Encounters:   11/08/21 91.2 kg (201 lb 1 oz)   10/29/21 91.2 kg (201 lb 1 oz)   10/26/21 91.6 kg (202 lb)   10/26/21 91.6 kg (202 lb)   09/15/21 89.4 kg (197 lb)   09/09/21 89.7 kg (197 lb 12.8 oz)   09/02/21 90.3 kg (199 lb 1.6 oz)   08/26/21 91.5 kg (201 lb 11.2 oz)   08/19/21 92 kg (202 lb 12.8 oz)   08/12/21 92.2 kg (203 lb 3.2 oz)   08/05/21 92.2 kg (203 lb 4.8 oz)    06/25/21 93.3 kg (205 lb 9.6 oz)   06/16/21 92.7 kg (204 lb 5.9 oz)   05/18/21 92.1 kg (203 lb)   05/05/21 93.7 kg (206 lb 8 oz)   04/28/21 93.4 kg (206 lb)   11/24/20 91.7 kg (202 lb 2.6 oz)   09/21/20 91.7 kg (202 lb 2.6 oz)   09/10/20 94.7 kg (208 lb 12.4 oz)   08/18/20 94.7 kg (208 lb 12.8 oz)     Weights (last 14 days)     Date/Time Weight Weight Source Percentage Weight Change (%) Who    11/08/21 1150 91.2 kg (201 lb 1 oz) --  2.7 % MS    Weight Source: reported weight by Ronney Asters, RN at 11/08/21 1150    11/04/21 2353 88.8 kg (195 lb 12.3 oz) Bed scale 0 % MD          Clinical Considerations:   Allergies:   Allergies   Allergen Reactions    Amoxicillin Rash    Sulfa Drugs Unspecified  GI:  Stool Assessment for the past 168 hrs:   Stool (mL) Stool Occurrence   11/05/21 0754 -- 0   11/06/21 1710 -- 1   11/08/21 0500 0 ml --   11/08/21 1000 -- 0   11/08/21 1140 -- 0       Skin Integrity:  Skin Integrity (WDL): Exceptions to WDL  Skin Integrity  Generalized Skin Integrity: Redness;Other (Comment) (redness to previous neuromonitoring sites)    Wounds/Incisions:  Impaired Skin Integrity -  Skin tear Hip Anterior (Active)       Incision -  11/08/21 1517 Back Lower (Active)       Incision -  11/08/21 2220 Abdomen Lateral;Left;Upper (Active)       Pressure Injuries:       Edema:         Labs: reviewed   Recent Labs     11/09/21  0440 11/11/21  0537   NA 137 139   K 4.8 4.5   CL 100 104   BICARB 25 25   BUN 18 23   CREAT 1.10 1.04   GLU 206* 120*   [place] 8.8 8.3*   ALK 64 62   ALT 17 8   AST 23 17   TBILI 0.79 0.59   ALB 3.9 3.3*   WBC 17.7* 11.7*   ABSNEUTRO 16.2* 8.2*       No results found for: CHOL, HDL, LDLCALC, TRIG, LDLDIRECT    No results found for: A1C    No results for input(s): GLUCPOCT in the last 72 hours.    No results found for: VITD25HYDROX, VITAMIND25HY, VD2, VD3, VDT    Medications  IV:   Scheduled:    acetaminophen  975 mg Q8H    allopurinol  300 mg Daily    amLODIPINE  10 mg Daily     bisacodyl  10 mg Once    bisacodyl  10 mg HS    Followed by    bisacodyl  10 mg Q48H NR    ceFAZolin (ANCEF) IV  2,000 mg Q8H    docusate sodium  100 mg TID    DULoxetine  20 mg Daily    famotidine  20 mg Q12H    lidocaine  1 patch Q24H    polyethylene glycol  17 g Daily    pregabalin  100 mg HS    senna  2 tablet Daily       Discharge: pending clinical course    Education: when clinically appropriate    RD/DTR to monitor/evaluate: labs, wt trend, and po/nutrition support status, and S/S of new skin concerns.  Relayed findings to RD.    Will continue to follow patient per approved [place] Nutrition Prioritization Schedule guidelines. Nutrition Services remains available via [place] should patient medical status change.    Baird Kay, [place]    11/11/21

## 2021-11-12 ENCOUNTER — Other Ambulatory Visit: Payer: Self-pay

## 2021-11-12 LAB — COMPREHENSIVE METABOLIC PANEL, BLOOD
ALT (SGPT): 6 U/L (ref 0–41)
AST (SGOT): 13 U/L (ref 0–40)
Albumin: 3.3 g/dL — ABNORMAL LOW (ref 3.5–5.2)
Alkaline Phos: 56 U/L (ref 40–129)
Anion Gap: 9 mmol/L (ref 7–15)
BUN: 20 mg/dL (ref 8–23)
Bicarbonate: 28 mmol/L (ref 22–29)
Bilirubin, Tot: 0.6 mg/dL (ref <1.2)
Calcium: 8.4 mg/dL — ABNORMAL LOW (ref 8.5–10.6)
Chloride: 103 mmol/L (ref 98–107)
Creatinine: 1.03 mg/dL (ref 0.67–1.17)
Glucose: 130 mg/dL — ABNORMAL HIGH (ref 70–99)
Potassium: 3.9 mmol/L (ref 3.5–5.1)
Sodium: 140 mmol/L (ref 136–145)
Total Protein: 5.4 g/dL — ABNORMAL LOW (ref 6.0–8.0)
eGFR Based on CKD-EPI 2021 Equation: >60 mL/min

## 2021-11-12 LAB — CBC WITH DIFF, BLOOD
ANC-Automated: 6.1 10*3/uL (ref 1.6–7.0)
Abs Basophils: 0 10*3/uL (ref <0.2)
Abs Eosinophils: 0.1 10*3/uL (ref 0.0–0.5)
Abs Lymphs: 1.7 10*3/uL (ref 0.8–3.1)
Abs Monos: 0.7 10*3/uL (ref 0.2–0.8)
Basophils: 0 %
Eosinophils: 1 %
Hct: 30.2 % — ABNORMAL LOW (ref 40.0–50.0)
Hgb: 10.2 gm/dL — ABNORMAL LOW (ref 13.7–17.5)
Imm Gran %: 1 % — ABNORMAL HIGH (ref <1)
Imm Gran Abs: 0.1 10*3/uL — ABNORMAL HIGH (ref <0.1)
Lymphocytes: 20 %
MCH: 30.9 pg (ref 26.0–32.0)
MCHC: 33.8 g/dL (ref 32.0–36.0)
MCV: 91.5 um3 (ref 79.0–95.0)
MPV: 10.4 fL (ref 9.4–12.4)
Monocytes: 8 %
Plt Count: 185 10*3/uL (ref 140–370)
RBC: 3.3 10*6/uL — ABNORMAL LOW (ref 4.60–6.10)
RDW: 13.6 % (ref 12.0–14.0)
Segs: 70 %
WBC: 8.8 10*3/uL (ref 4.0–10.0)

## 2021-11-12 MED ORDER — INFLUENZA VAC 65+ YRS WRAPPED RECORD (~~LOC~~)
1.0000 | PREFILLED_SYRINGE | INTRAMUSCULAR | Status: AC
Start: 2021-11-12 — End: 2021-11-12

## 2021-11-12 MED ORDER — PNEUMOCOCCAL VAC POLYVALENT 25 MCG/0.5ML IJ INJ (CUSTOM)
0.5000 mL | INJECTION | INTRAMUSCULAR | Status: DC
Start: 2021-11-12 — End: 2021-11-12

## 2021-11-12 NOTE — Progress Notes (Signed)
Orthopedics Spine Progress Note  11/12/2021    Patient ID:  Name: Stephen [name]  MRN: [id number]  DOB: [birth year]    Procedures:  11/08/21  XLIF L2-L3  PSIF L2-S1    Subjective:  Feeling well this morning  Mod R knee pain this morning but preop leg pain still resolved     Objective:  Vital Signs:  BP (!) 107/51 (BP Location: Right arm, BP Patient Position: Semi-Fowlers)   Pulse 64   Temp 98.4 F (36.9 C)   Resp 18   Ht 5\' 6"  (1.676 m)   Wt 91.2 kg (201 lb 1 oz)   SpO2 95%   BMI 32.45 kg/m     Physical Exam:  General: patient awake, alert, and responding to commands; no apparent distress  Cardio: regular rate and rhythm per pulse  Respiratory: patient breathing comfortably without use of accessory muscles  Neck/Back/Abdomen: dressings in place, clean/dry/intact, Drain in place, output ss      Bilateral Lower Extremity   Motor:       Right           Left        Iliopsoas (L2/3)                     5/5           5/5         Quadriceps (L4)                    5/5           5/5        Tibialis Anterior (L4/5)           5/5           5/5        EHL (L5)                               5/5           5/5        GSC (S1)                              5/5           5/5   Sensation:        SILT in L2-S1 distributions.   Special Tests:        Downgoing plantar response. No clonus.    Vascular Exam:        Warm and well perfused distally    Laboratory Data:   Lab Results   Component Value Date    WBC 8.8 11/12/2021    HGB 10.2 (L) 11/12/2021    HCT 30.2 (L) 11/12/2021    PLT 185 11/12/2021     Lab Results   Component Value Date    NA 140 11/12/2021    K 3.9 11/12/2021    CL 103 11/12/2021    BICARB 28 11/12/2021    BUN 20 11/12/2021    CREAT 1.03 11/12/2021    GLU 130 (H) 11/12/2021     8.4 (L) 11/12/2021     No results found for: INR, PTT      Input/Output:    Intake/Output Summary (Last 24 hours) at 11/12/2021 0956  Last data filed at 11/12/2021 0900  Gross per 24 hour   Intake 680 ml  Output 140 ml   Net 540 ml        Current Medications:  Current Facility-Administered Medications   Medication   . acetaminophen (TYLENOL) tablet 975 mg   . allopurinol (ZYLOPRIM) tablet 300 mg   . amLODIPINE (NORVASC) tablet 10 mg   . bisacodyl (DULCOLAX) suppository 10 mg   . ceFAZolin in dextrose (ANCEF) IVPB 2,000 mg   . docusate sodium (COLACE) capsule 100 mg   . DULoxetine (CYMBALTA) CR capsule 20 mg   . famotidine (PEPCID) tablet 20 mg   . lactulose enema (CEPHULAC) enema 300 mL   . lidocaine (ASPERCREME) 4 % patch 1 patch   . LORazepam (ATIVAN) tablet 1 mg   . LORazepam (ATIVAN) tablet 1 mg   . magnesium hydroxide (MILK OF MAGNESIA) suspension 30 mL   . ondansetron (ZOFRAN) injection 4 mg   . oxyCODONE (ROXICODONE) tablet 5 mg    Or   . oxyCODONE (ROXICODONE) tablet 10 mg   . oxyCODONE (ROXICODONE) tablet 5 mg   . polyethylene glycol (MIRALAX) packet 17 g   . pregabalin (LYRICA) capsule 100 mg   . senna (SENOKOT) tablet 17.2 mg       Assessment:   70 year old male 4 Days Post-Op s/p above procedures. Patient doing well and progressing appropriately.   - Acute Blood Loss Anemia with component of hemodilution    Plan:  - PT: OOBTC w/ TLSO  - Antibiotics: complete peri-operative   - DVT Prophylaxis: no pharmacologic prophylaxis; SCDs/TED hose, ambulation  - Pain Medication: multimodal   - Diet: regular  - Post op imaging: uprights complete  - Drain: to gravity currently   - Foley: dc  - Other: aggressive incentive spirometry  - Dispo: pending , PT clearance, pain control on oral pain medication, CM clearance, PT clearance (need for continued inpatient physical therapy evaluation to assess patient's mobility, in order to ensure a safe dispo plan)    Freida Busman A. Lovena Le, MD  Spine Surgery Fellow  Dept. Of Orthopaedic Surgery  P: [phone]          Please page the Orthopaedic Spine Surgery Team with questions or concerns based on patient location:     Established spine floor patients at [facility]: Ortho Spine 1 - [phone]      Established  spine floor patients at [facility]: Ortho Spine 2 - [phone]      New spine consultations not yet followed by spine team:    View [facility] on call for "SPINE CONSULT" call schedule (Orthopaedic Spine versus Neurosurgery)   If Orthopaedic Spine is on call please page the [place] on call:   [place]: ORTHOPEDICS/TH   [place]: ORTHOPEDICS/HC

## 2021-11-12 NOTE — Plan of Care (Signed)
Problem: Promotion of Health and Safety  Goal: Promotion of Health and Safety  Description: The patient remains safe, receives appropriate treatment and achieves optimal outcomes (physically, psychosocially, and spiritually) within the limitations of the disease process by discharge.    Information below is the current care plan.  Outcome: Progressing  Flowsheets  Taken 11/12/2021 1013  Guidelines: Inpatient Nursing Guidelines  Taken 11/12/2021 0920  Patient /Family stated Goal: walk  Note:      Pt alert and oriented x4, often anxious at times but cooperative with care. Neuros intact, no new deficits. AVSS, afebrile. NSR on monitor. Sp02 stable on RA. Encourage IS use.   Voiding without difficulty via bathroom.   LBM >3 overnight/diarrhea  s/t extensive BM regimen- held supplements this shift.  DSGs CDI, no drainage. Small shallow intact/scabbed skin tear to left upper leg/groin area- remains CDI no current s/s infection. JP to gravity drainage per order, output as documented.  Pain controlled on current regimen. Cleared with PT. Ambulated halls x3 this AM with FWW and TLSO, tolerated well.   Plan to dc home with services when cleared.

## 2021-11-12 NOTE — Plan of Care (Addendum)
Pt cleared for discharge home. AVS/Discharge, and new medication education provided-pt states understanding. Pt aware of follow up appts. DSG supplies and wound/incision instructions given . DC pharmacy to deliver medications to bedside prior to leaving hospital. All belongings in pt possession. Will transport pt off unit via wheelchair when appropriate    Transport pending for possible after 1430- will remove PIV at that time prior to leaving.       Addendum @ 5259- PIV removed, tip intact. Patient transported off unit via wheelchair with all belongings.      @1500 - picked up by scheduled lyft driver plate [place] # [id number]

## 2021-11-12 NOTE — Interdisciplinary (Signed)
11/12/21 1136   Assessment   Assessment Type Initial   Referral Information   Referral Type Discharge Planning   Social Assessment   Primary Family/Caregiver Contact Name, Number and Relationship * sister [name] [phone]   Social Determinants of Health   Has discharge transport been arranged? Yes   Discharge Transportation Details *  Ambulatory transport (curb to curb) requested for 2:30pm pick up; Confirmation #[id number]   Transportation Company/Phone Number *  Manuel[employer] Neptune [phone]     SOCIAL WORK: Responding to request to assist with arranging transportation home for the patient, who is expected to discharge today.     SW spoke with the patient and verified pt's home address ([place] [id number]) and cell phone number [phone]); pt expressed appreciation for assistance in arranging transport.     SW then called patient's managed [employer] plan [employer] [phone]) and requested ambulatory pick up for 2:30pm. SW provided patient's cell and nurse's station number as points of contact. Reference #: [id number]    SW updated bedside RN and CM of arrangements made.    PLAN: Pt expected to discharge today with transportation arranged on patient's behalf.    Corena Pilgrim, LCSW  Pager: [phone]

## 2021-11-12 NOTE — Interdisciplinary (Signed)
11/12/21 1049   Discharge Plan   Is the Patient Medically Stable for Discharge * Yes   CM Discharge Arrangements Complete Yes   Living Arrangements * Family Member   Post Acute Services Referred To [place]   Patient Engaged in Discharge Planning * Yes   Family/Caregiver's Assessed for * Readiness, willingness, and ability to provide or support self-management activities   Respite Care * Not Applicable   Patient/Family/Other Are In Agreement With Discharge Plan * Yes   PATIENT CHOICE: Patient/Family/Legal/Surrogate Decision Maker Has Been Given a List Options And Choice In The Selection of Post-Acute Care Providers Yes   Discharge Music therapist   Final Discharge Destination/Services   Final Discharge Destination/Services [facility] [place]   Phone [phone]     Cleared for DC home.  [place] informed to contact patient.  No other home DC needs reported.

## 2021-11-15 ENCOUNTER — Other Ambulatory Visit: Payer: Self-pay | Admitting: Case Management

## 2021-11-15 ENCOUNTER — Telehealth (HOSPITAL_BASED_OUTPATIENT_CLINIC_OR_DEPARTMENT_OTHER): Payer: Self-pay | Admitting: Orthopaedic Surgery of the Spine

## 2021-11-15 NOTE — Telephone Encounter (Signed)
Please schedule F/U appointment for refills. Thank you! °

## 2021-11-15 NOTE — Telephone Encounter (Signed)
Population Health Team RN Case  Manager Patient Outreach 2nd Call Attempt    Placed call  to pt. No answer, left message  No calls received after two TCM outreach attempts.    Plan  Close case due to unable to reach pt.

## 2021-11-15 NOTE — Telephone Encounter (Signed)
Open in error

## 2021-11-15 NOTE — Telephone Encounter (Signed)
RN Called and left voicemail for pt that appoint was rescheduled to the below time         RN advised pt to callback with any questions     If pt calls please let them know that the appointment was changed to 11/23/21 at 4:00 with Renato Gails

## 2021-11-15 NOTE — Telephone Encounter (Signed)
Population Health Team RN Case Manager Patient Outreach  1st Call Attempt     Notes    First TCM call to follow up post discharge from hospital.  Placed call  to pt.  No answer.  Left message and requested a call back    Plan  Follow up with 2nd attempt phone call if no call received

## 2021-11-15 NOTE — Op Note (Unsigned)
DATE OF SERVICE:    11/12/2021    PREOPERATIVE DIAGNOSIS:    1. Degenerative spondylolisthesis.  2. 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  3. Status post remote lumbar fusion L3 through sacrum with adjacent  segment degeneration and stenosis.   4. Status post stage I far lateral interbody fusion L2-L3.    POSTOPERATIVE DIAGNOSIS:    1. Degenerative spondylolisthesis.  2. 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  3. Status post remote lumbar fusion L3 through sacrum with adjacent  segment degeneration and stenosis.   4. Status post stage I far lateral interbody fusion L2-L3.    PROCEDURE PERFORMED:    1. Removal of instrumentation L3 through sacrum consisting of rods  and screws (DePuy Synthes).   2. Exploration of fusion L3 through S1 (intact).  3. Revision posterior segmental spinal instrumentation and fusion L2  through sacrum, consisting of screws and rods (DePuy Synthes), local  bone autograft, DBM, beta tricalcium phosphate (Magnetos).   4. Revision laminectomies L4, L3, partial laminectomy, L2 with  bilateral facetectomies and foraminotomies L2-L3, L3-L4.   5. Decompression of cauda equina and exploration of nerve roots  bilateral L4, L3, L2.   6. Interpretation of intraoperative fluoroscopy.  7. Interpretation of intraoperative neuromonitoring.    SURGEON/STAFF:  Illa Level Zaccheus Edmister, MD    ASSISTANT STAFF:    1. Chauncy Passy, MD.  2. Bonnetta Barry, MD.   3. Daivd Council, MD.        Note, there was no qualified orthopedic surgery resident available to  assist with this case.     ANESTHESIA:  General.    EXAM FINDINGS:  Severe stenosis.    WOUND CLASSIFICATION:  Class 1.    WOUND CLOSURE STATUS:  All layers closed.    SPECIMENS:  Rods to path.    IV FLUIDS:  2 L LR.    BLOOD PRODUCTS:  None.    BLOOD LOSS:  400 cc.    URINE OUTPUT:  200 cc.    COMPLICATIONS:  None.    INDICATIONS:  Patient is a 70 year old male with multiple medical  issues who has ongoing worsening  complaints of low back pain as well  as radiating bilateral lower extremity pain with associated numbness  and weakness.  He has a complex history with regard to his lumbar  spine having previously undergone lumbar fusion several years ago  from L3 through S1 following which he had marked improvement in his  back pain as well as radicular symptoms with return to activities.  He has noted significant deterioration of his symptoms over the last  several months to the point where he has presented to the emergency  department where imaging demonstrated evidence of retrolisthesis  L2-L3 in the setting of prior lumbar fusion L3 through S1, which  appeared to be well formed status post wide decompression.  There was  evidence of adjacent segment degeneration, severe stenosis which may correlate  with the distribution and progression of his symptoms.  We discussed  options in detail including possible management electively, however,  he was interested in immediate surgical address.  We therefore  discussed the planned procedure as well as risks, benefits,  alternatives with plan for staged completion of far-lateral interbody  fusion L2-L3 followed by revision laminectomy and posterior spinal  instrumentation and fusion L2 through sacrum.  We discussed the  planned procedure as well as risks, benefits and alternatives in  detail.  Risks include, but not limited to infection, bleeding,  damage  to nerves, vessels, dural tear, ongoing back pain as well as  leg pain and/or weakness and/or numbness and paresthesias, hardware  complications or failure, pseudarthrosis or nonunion, additional  procedures, as well as risk of general anesthesia including but not  limited to DVT, PE, stroke, myocardial infarction, loss of life.  He  expressed understanding and willingness to proceed in the form of a  signed consent.     PROCEDURE NARRATIVE:  Patient was met in the preoperative holding  area where we again reviewed the planned procedure  with him and  answered all his questions.  Operative site was marked he was  subsequently taken to the operating theater where he succumbed to  general endotracheal anesthesia and was placed in the lateral  decubitus position with all his extremities and bony prominences  appropriately padded and protected.  He underwent first stage of  surgery consisting of far lateral interbody fusion L2-L3.  For  additional details, please refer to separately dictated operative  note.  Upon completion he was deemed appropriate to proceed and was  as such was transferred and placed prone on a Jackson frame with all  his extremities and bony prominences appropriately padded and  protected.  Neuromonitoring needles were retained.  Antibiotic  prophylaxis was appropriately redosed.  The area over the low back  was prepped and draped in usual sterile fashion.  Prior to  proceeding, we again performed a formal preoperative verification  time-out procedure in which we confirmed patient identity by name,  number, birth date as well as the planned procedure and availability  of all necessary equipment.     A 15 blade scalpel was used to make midline incision extending in  line with his prior incision which was reopened and slightly extended  cephalad.  We continued dissection through superficial layers using  electrocautery, maintaining excellent hemostasis where there was  extensive scar tissue, however, excellent healing.  We identified  lumbodorsal fascia which was then incised bilaterally and the deep  paraspinal muscles were elevated in subperiosteal fashion to expose the bony elements as  well as retained instrumentation from L3 through sacrum, which helped  facilitate visualization and we utilized a position of  instrumentation for level confirmation.  This was recorded as  intraoperative radiographic time-out procedure.  We continued  exposure of the bony elements at the L2 level and at this point,  proceeded with removal of  retained instrumentation consisting of set  screws and rods from L3 through sacrum using proprietary  instrumentation (DePuy Synthes).  This was performed without  difficulty.  We then proceeded to proceed with exploration of fusion,  both visually as well as with mechanical stressing.  There was  extensive and robust posterolateral bony fusion, which had grown over  the rods and this was removed in part with osteotomes to allow  adequate exposure of retained instrumentation.  We then continued to  evaluate the posterolateral fusion bed, which again was robust and  intact.  We then proceeded with mechanical stressing of screws in the  sagittal as well as coronal and axial planes which appeared to be  well intact with no evidence of segmental motion.  At this point, we  then proceeded with instrumentation at the adjacent L2 level.  Again,  we used standard visual anatomic landmarks to identify planned  starting point for pedicle screw placement.  High-speed matchstick  bur was used to create a pilot hole bilaterally at the start points  for the L2 pedicle.  Gearshift pedicle finder was then used to  cannulate the pedicles, accessing the L2 vertebral body and  radiographic markers were placed.  AP and lateral fluoroscopic images  were obtained demonstrating appropriate positioning.  We then  proceeded with screw placement.  A 4.35 mm tap was used to prepare  screw tracts and we positioned 6 x 55 mm screws with adequate  purchase.  We again obtained AP and lateral fluoroscopic imaging  demonstrating adequate positioning of instrumentation.     At this point, we then proceeded with revision lumbar decompression.  A Leksell rongeur as well as high-speed matchstick bur was used to  resect the spinous processes of the lamina of L4 as well as L3 and  caudal portion of L2.  There was extensive epidural scar tissue  present over the area prior laminectomy and a defect from prior  laminectomy bed and we used large Cobb  curettes to elevate scar from  the posterolateral bony fusion off the medial columns and we were  then able to elevate en bloc fashion extensive epidural scar tissue  off the underlying dura which was removed without difficulty.  We  then used large Cobb curettes as well as straight and forward angle  Epstein curettes to identify the bony margins of the prior  laminectomy defect to all 4 and straight and forward angle Epstein  curettes again were used to develop a plane between epidural scar and  the overlying bony elements including an adequate plane.  We further  thinned out the residual bone of L4 and L3 with high-speed matchstick  bur and straight and forward angle Epstein curettes were again used  to delineate the plane between of the bone and underlying dura and  scar tissue.  We therefore then proceeded with decompression with  revision laminectomy of L4 followed by L3 with 3 and 4 mm Kerrison  rongeurs achieving thorough decompression of the cauda equina  centrally.  We then widened the laminectomies from pedicle to  pedicle, ensuring thorough decompression of the canal and proceeded  with cephalad.  At the L2-L3 level, there was extensive ligamentum  flavum hypertrophy with near severe stenosis with complete  obliteration of the cauda equina.  3 and 4 mm Kerrison rongeurs were  used to resect hypertrophic ligamentum flavum as well as complete  partial laminectomy of the caudal portion of the L2 lamina to achieve  thorough decompression of the cauda equina.  We then completed  bilateral facetectomies at L2-L3 as well as L3-L4 in addition to  bilateral foraminotomies to ensure thorough decompression of the  exiting nerve roots as well as mobilize the L5 level for correction  of segmental sagittal alignment using combination of high-speed  matchstick bur, as well as 3 and 4 mm Kerrison rongeurs to resect the  inferior articular process of L2 and L3 as well as superior articular  processes of L3 and L4.  Upon  completion of revision laminectomies,  there was marked improvement in overall caliber and turgor of the  dura with restoration of parameters and we confirmed wide patency of  the exiting nerve roots L4, L3, L2 bilaterally both visually as well  as with palpation with Surveyor, quantity.     At this point, we then proceeded with sizing and contouring of rods.  We sized two 85 mm rods which were contoured with a [place] bender to  introduce appropriate lordosis for segmental sagittal correction.  Rods were secured distally without difficulty with set screws and  reduction towers were used to help facilitate rod positioning and  placement.  We then sequentially placed set screws bilaterally and  proceeded with final tightening with torque-limiting screwdriver  segmentally from L2 through the sacrum with revision of the fusion  bed.  A high-speed matchstick bur was used to decorticate the bony  elements posterior laterally from L2 through the sacrum.  This was  then packed with local bone autograft as well as DBM and was  subsequently then reinforced with beta tricalcium phosphate ( Magnetos).  We then obtained final AP and lateral fluoroscopic imaging  demonstrating excellent positioning of instrumentation with  restoration of segmental parameters.     We then proceeded with closure.  A 10 mm flat drain was placed deep  to the lumbodorsal fascia.  Vancomycin powder was placed.  Fascia was  reapproximated with #1 Vicryl.  Superficial layers were closed in  layered fashion with 0 and 2-0 Vicryl.  Skin was closed with 2-0  nylon.  Sterile dressings were placed.  Patient was then transferred  to a regular bed and extubated without difficulty.     DISPOSITION:  Patient returned to PACU in stable condition.  We will  continue mechanical DVT prophylaxis and mobilize with TLSO brace with  physical therapy.  Continue to trend drain output with prophylactic  antibiotics while drain in place.     There were no neuromonitoring  issues throughout this case.      Job #:  [id number]

## 2021-11-15 NOTE — Telephone Encounter (Signed)
Patient calling to scheduled 2 week Post-op appointment for surgery on 11/09/21. 1st available appointment on 12/06/21 which patient is scheduled for, as well as placed on watlist.Requesting a call back to discuss sooner post op time and date.      Please assist        Cb [phone]

## 2021-11-15 NOTE — Op Note (Unsigned)
DATE OF SERVICE:  11/08/2021    PREOPERATIVE DIAGNOSIS:    1. Degenerative spondylolisthesis.  2. Lumbar stenosis and claudication.  3. Status post remote lumbar fusion L3 through S1 with adjacent  segment degeneration and stenosis.     POSTOPERATIVE DIAGNOSIS:    1. Degenerative spondylolisthesis.  2. Lumbar stenosis and claudication.  3. Status post remote lumbar fusion L3 through S1 with adjacent  segment degeneration and stenosis.     PROCEDURE PERFORMED:    1. Far lateral interbody fusion L2-L3 with titanium cage (C-spine),  DBM, rhBMP-2.   2. Placement of biomechanical vertebral structural device consisting  of titanium cage.   3. Use of intraoperative microscope (Viseon).   4. Interpretation of intraoperative fluoroscopy.  5. Interpretation of intraoperative neuromonitoring (NuVasive and  evoked potentials).     SURGEON/STAFF:  Ahmyah Gidley, MD    ASSISTANT:  Lacey Jensen, MD     Note, there was no qualified orthopaedic surgery resident available to assist with this case.    ANESTHESIA:  General.    FINDINGS:  Spondylolisthesis, degenerative disk disease.    WOUND CLASSIFICATION:  Class 1.    WOUND CLOSURE STATUS:  All layers closed.    SPECIMENS:  None.    IV FLUIDS:  1 L LR.    BLOOD PRODUCTS:  None.    BLOOD LOSS:  5 cc.    URINE OUTPUT:  75 cc.    COMPLICATIONS:  None.    INDICATIONS:  Patient is a 70 year old male who has ongoing worsening  complaints of back pain as well as radiating bilateral lower  extremity pain with associated numbness and weakness.  He has a  complex history with regard to his lumbar spine having previously  undergone lumbar decompression and fusion several years ago from L3  through S1 following which he has had an uneventful course and had  marked improvement in his symptoms with return to regular activities.   He notes over the last several months, he has had progressive  worsening of symptoms to the point where he presented to the  emergency department with acute and  worsening complaints of back pain  as well as lower extremity pain and numbness and weakness as noted.  Imaging shows evidence of prior lumbar fusion intact from L3 through  S1 with well-formed fusion with adjacent segment degeneration and  retrolisthesis at L2-L3 with associated severe stenosis which may  correlate with the distribution and progression of his symptoms.  We  discussed options in detail and he was interested in surgical  address, which he did wish to expedite as much as possible given  worsening subjective symptoms.  We did attempt to coordinate  electively as an outpatient.  However, given inability to mobilize,  plan was to proceed with definitive surgical address during this  admission with plan for staged completion of far lateral interbody  fusion, L2-L3, followed then by revision laminectomy and posterior  spinal instrumentation and fusion L2 through sacrum.  We discussed  the planned procedure as well as risks, benefits and alternatives in  detail.  Risks include, but not limited to infection, bleeding,  damage to nerves vessels, dural tear, ongoing back pain as well as  leg pain and/or weakness and/or numbness and paresthesias, hardware  complications or failure, pseudarthrosis or nonunion, additional  procedures, as well as risk of general anesthesia including but not  limited to DVT, PE, stroke, myocardial infarction, loss of life.  He  expressed understanding and willingness to proceed in  the form of a  signed consent.     PROCEDURE NARRATIVE:  Patient was met in the preoperative holding  area where we again reviewed the planned procedure with him and  answered all his questions.  Operative site was marked. He was  subsequently taken to the operating theater where he succumbed to  general endotracheal anesthesia and was placed in the lateral  decubitus position with all his extremities and bony prominences  appropriately padded and protected.  Neuromonitoring needles were  placed.  He  received appropriate preoperative antibiotic prophylaxis.   The area over the flank was prepped and draped in usual sterile  fashion.  Prior to proceeding, we performed a formal preoperative  verification time-out procedure in which we confirmed patient  identity by name number birth date as well as the planned procedure  and availability of all necessary equipment.     A 15 blade scalpel used to make a 1 cm transverse incision in the  posterolateral flank region just lateral to the paraspinal muscles  and this was carried down through superficial layers using  electrocautery, maintaining excellent hemostasis.  We identified the  fascial plane which was then bluntly penetrated, allowing access to  the retroperitoneal space which was developed with gentle blunt  finger dissection.  At this point, a 2 cm transverse incision was  made directly in line with the L3-L3 disk space in the lateral flank  region.  This was carried down through superficial layers using  electrocautery, maintaining excellent hemostasis.  We identified the  oblique fascia and the fibers of the oblique muscle layers bluntly  separated allowing access to the previously developed retroperitoneal  space.  We then sequentially placed dilators onto the psoas muscle  and tubular retractor was positioned and we confirmed integrity of  our planned approach with presence of the psoas muscle within our  field.  Innermost dilator was then attached to neuromonitoring and  under fluoroscopic and neuromonitoring guidance, we traversed the  psoas muscle onto the lateral aspect of the L2-L3 disk space there  was significant lateral osteophytes as well as retrolisthesis.  We  were able to ultimately achieve adequate positioning and placement.  Again, this was secured with a guide pin.  We sequentially dilated up  under neuromonitoring guidance and tubular retractor was positioned  and attached to rigid table side arm.  We obtained AP and lateral  fluoroscopic  imaging demonstrating appropriate positioning.  Neuromonitoring probe was used to evaluate the retractor field,  identifying neurologic structures well posterior to the retractor  blades.  We then attached the microscope to the retractor blade and  the procedure was carried out under microscopic visualization  (Viseon).  We then proceeded with diskectomy in standard fashion with  annulotomy knife followed by Cobb elevators to develop the  cartilaginous endplates and release the contralateral annulus.  Pituitary and Kerrison rongeurs in addition to ring curettes,  endplate scrapers, and rasps were used to complete diskectomy.  There  was significant disk space collapse, especially asymmetric making  access to the disk space challenging.  We therefore used blunt  distractors to develop the disk space, 1st starting with a 4 x 16,  followed then by 6 x 18 mm paddle distractor to develop the disk  space and complete diskectomy.  We then proceeded with trialing and sized  up to a 15-degree lordotic 10 x 23 x 55 mm trial, which had excellent  interference fit with restoration of segmental parameters.  We  completed definitive endplate preparation with ring curettes and  endplate scrapers and rasps.  After confirming appropriate  positioning of trial, we then proceeded with preparation of  definitive titanium cage (C-spine) which was similarly sized and  packed with DBM as well as rhBMP-2.  The cage was then inserted under  direct visual as well as fluoroscopic guidance with excellent  interference fit and restoration of segmental parameters as well as  disk height and foraminal volume.  Bone wax was placed over the  lateral aspect of the cage.  Hemostatic agents were placed.  Tubular  retractor was removed under direct visual guidance confirming  excellent hemostasis as well as integrity of all tissue planes.  We  then obtained final AP and lateral fluoroscopic imaging demonstrating  excellent positioning of  instrumentation with restoration of  segmental parameters.  We then proceeded with closure.  Incisions  were copiously irrigated.  Vancomycin powder was placed.  Fascia was  reapproximated with #1 Vicryl.  Superficial layers were closed in  layered fashion with 0 and 2-0 Vicryl.  Skin was closed with 3-0  Monocryl.  Sterile dressings were placed.     DISPOSITION:  Patient tolerated first stage of surgery without  difficulty and is deemed appropriate to proceed with completion of  second stage which will be dictated under separate heading.     There were no neuromonitoring issues throughout this case.      Job #:  [id number]

## 2021-11-15 NOTE — Telephone Encounter (Signed)
OUTBOUND CALL: Left voicemail to please call [facility] to confirm upcoming appointment ph. [phone]. I have also sent my chart message.     When patient calls back please document and close encounter.

## 2021-11-16 ENCOUNTER — Other Ambulatory Visit (HOSPITAL_BASED_OUTPATIENT_CLINIC_OR_DEPARTMENT_OTHER): Payer: Self-pay | Admitting: Nurse Practitioner

## 2021-11-16 DIAGNOSIS — Z981 Arthrodesis status: Secondary | ICD-10-CM

## 2021-11-16 MED ORDER — PREGABALIN 100 MG OR CAPS
ORAL_CAPSULE | ORAL | 0 refills | Status: DC
Start: 2021-11-16 — End: 2022-02-14

## 2021-11-16 NOTE — Telephone Encounter (Signed)
LVM to schedule appt

## 2021-11-17 ENCOUNTER — Other Ambulatory Visit (HOSPITAL_BASED_OUTPATIENT_CLINIC_OR_DEPARTMENT_OTHER): Payer: Self-pay | Admitting: Nurse Practitioner

## 2021-11-17 DIAGNOSIS — Z981 Arthrodesis status: Secondary | ICD-10-CM

## 2021-11-17 NOTE — Telephone Encounter (Signed)
DOS 11/08/21  Revision lumbar spine, posterior, lumbar Lumbar 2-Sacrum    Code     Will send for refill.

## 2021-11-19 ENCOUNTER — Other Ambulatory Visit: Payer: Self-pay | Admitting: Cardiovascular Disease

## 2021-11-19 ENCOUNTER — Encounter: Payer: Self-pay | Admitting: Cardiovascular Disease

## 2021-11-19 ENCOUNTER — Telehealth (HOSPITAL_BASED_OUTPATIENT_CLINIC_OR_DEPARTMENT_OTHER): Payer: Self-pay | Admitting: Orthopaedic Surgery of the Spine

## 2021-11-19 DIAGNOSIS — M5416 Radiculopathy, lumbar region: Secondary | ICD-10-CM

## 2021-11-19 DIAGNOSIS — G8918 Other acute postprocedural pain: Secondary | ICD-10-CM

## 2021-11-19 NOTE — Telephone Encounter (Signed)
DOS: 11/08/21- Revision Lumbar Spine      Re: Outpatient Physical therapy

## 2021-11-19 NOTE — Telephone Encounter (Signed)
Noted that medication refill was sent on 11-16-21.  No further action needed at this time, matter resolved and encounter closed.

## 2021-11-19 NOTE — Telephone Encounter (Signed)
Did HH PT assessment, pt demonstrates mobility without assistive devices. Recommending outpatient PT.

## 2021-11-22 NOTE — Telephone Encounter (Signed)
Outpatient pt order and facesheet faxed to [facility] in [place]  [phone]

## 2021-11-23 ENCOUNTER — Ambulatory Visit: Payer: Medicare Other | Attending: Nurse Practitioner | Admitting: Nurse Practitioner

## 2021-11-23 ENCOUNTER — Ambulatory Visit (HOSPITAL_BASED_OUTPATIENT_CLINIC_OR_DEPARTMENT_OTHER): Admit: 2021-11-23 | Discharge: 2021-11-23 | Disposition: A | Discharge status: Home Health | Payer: Medicare Other

## 2021-11-23 DIAGNOSIS — M5416 Radiculopathy, lumbar region: Secondary | ICD-10-CM

## 2021-11-23 DIAGNOSIS — Z9889 Other specified postprocedural states: Secondary | ICD-10-CM

## 2021-11-23 DIAGNOSIS — Z981 Arthrodesis status: Secondary | ICD-10-CM

## 2021-11-23 DIAGNOSIS — Z09 Encounter for follow-up examination after completed treatment for conditions other than malignant neoplasm: Secondary | ICD-10-CM

## 2021-11-23 MED ORDER — DOCUSATE SODIUM 100 MG OR CAPS
100.0000 mg | ORAL_CAPSULE | Freq: Three times a day (TID) | ORAL | 0 refills | Status: DC
Start: 2021-11-23 — End: 2022-01-19

## 2021-11-23 MED ORDER — OXYCODONE HCL 5 MG OR TABS
5.0000 mg | ORAL_TABLET | Freq: Two times a day (BID) | ORAL | 0 refills | Status: DC | PRN
Start: 2021-11-23 — End: 2022-08-02

## 2021-11-23 NOTE — Interdisciplinary (Signed)
Procedure: D/C sutures:  Steri strips applied      Location: Spine  Instruction/Education Provided: yes    Ordering Physician: Bilenkaya, Irina NP    Breg Product: No

## 2021-11-23 NOTE — Telephone Encounter (Signed)
SURGERY: Left ureteroscopy, possible biopsy, with laser fulguration of lesion, possible cytology washings, possible insertion of ureteral stent     Surgeon: Dr. Baron Hamper with patient - advised Dr. Sallyanne Kuster ordered surgery to be scheduled approximately 6 weeks from last visit on 11/08. Provided pt with next available surgery date on 01/06 with check in at 8 AM at [facility]. Pt agreed and request to send the rest of the information via MyChart since he has another appt.

## 2021-11-23 NOTE — Telephone Encounter (Signed)
Pt left me a voicemail message asking to schedule follow up with Dr. Sallyanne Kuster.    Left msg for pt - request call back to schedule procedure with Dr. Sallyanne Kuster. Will also send MyChart msg

## 2021-11-23 NOTE — Progress Notes (Signed)
[facility]  Spine Service Progress Note    Date Of Surgery: 11/12/21    Chief Complaint:   Post op from   PROCEDURE PERFORMED:    1. Removal of instrumentation L3 through sacrum consisting of rods  and screws (DePuy Synthes).   2. Exploration of fusion L3 through S1 (intact).  3. Revision posterior segmental spinal instrumentation and fusion L2  through sacrum, consisting of screws and rods (DePuy Synthes), local  bone autograft, DBM, beta tricalcium phosphate (Magnetos).   4. Revision laminectomies L4, L3, partial laminectomy, L2 with  bilateral facetectomies and foraminotomies L2-L3, L3-L4.   5. Decompression of cauda equina and exploration of nerve roots  bilateral L4, L3, L2.     1. Far lateral interbody fusion L2-L3 with titanium cage (C-spine),  DBM, rhBMP-2.   2. Placement of biomechanical vertebral structural device consisting  of titanium cage.   3. Use of intraoperative microscope (Viseon).     HPI:  Stephen [name] is a 70 year old male who returns for his post operative visit and is doing well.  They note improvement in pain and denies any fevers or chills.    Physical Exam:  General: 70 year old male in no acute distress.   Vitals: There were no vitals filed for this visit.  Motor:      Right  Left    Iliopsoas   5/5   5/5    Quads   5/5   5/5   Tib Ant    5/5     5/5   EHL   5/5   5/5   Gastroc   5/5   5/5    Sensory:   Bilaterally, intact to light touch from L1-S1    Reflexes:   Bilaterally, no hoffmans or clonus    Skin:   Incision is clean, dry and intact without signs of erythema or drainage. Sutures removed    Vascular:   2+ DP/PT    Radiographs:  Radiographs from current were reviewed and are notable for no changes since post op    Impression:  Post Op from [facility] Surgery    Plan:  Continue bracing, pain management and PT>    After a discussion we were able to answer all of his questions and he  voiced understanding of this plan and will follow up in 4  weeks.    On follow up he  will  need repeat radiographs.

## 2021-11-24 NOTE — Telephone Encounter (Signed)
APC call scheduled on 12/21

## 2021-11-26 ENCOUNTER — Telehealth: Payer: Self-pay | Admitting: Cardiovascular Disease

## 2021-11-26 ENCOUNTER — Other Ambulatory Visit (HOSPITAL_BASED_OUTPATIENT_CLINIC_OR_DEPARTMENT_OTHER): Payer: Self-pay | Admitting: Urology

## 2021-11-26 DIAGNOSIS — E785 Hyperlipidemia, unspecified: Secondary | ICD-10-CM

## 2021-11-26 LAB — EMMI,  PAIN MANAGEMENT: ACUTE IN HOSPITAL: EMMI Video Order Number: 19160168027

## 2021-11-26 LAB — EMMI , ANESTHESIA (ADULT): EMMI Video Order Number: 14381037119

## 2021-11-26 LAB — EMMI , PATIENT SATISFACTION: HOSPITAL DISCHARGE EXPECTATIONS: EMMI Video Order Number: 17216936835

## 2021-11-26 NOTE — Telephone Encounter (Signed)
Lab orders placed for fasting lipid and lft.  Ok to schedule a lab appt and o/v shortly after the lab.

## 2021-11-26 NOTE — Telephone Encounter (Signed)
Patient overdue for visit and wants labs (lipid mentioned) prior to visit.

## 2021-11-27 ENCOUNTER — Encounter (HOSPITAL_COMMUNITY): Payer: Self-pay | Admitting: Orthopaedic Surgery of the Spine

## 2021-12-01 ENCOUNTER — Inpatient Hospital Stay
Admission: EM | Admit: 2021-12-01 | Discharge: 2021-12-06 | DRG: 177 | Disposition: A | Discharge status: Home / Self Care | Payer: Medicare Other | Attending: Internal Medicine | Admitting: Internal Medicine

## 2021-12-01 ENCOUNTER — Emergency Department (HOSPITAL_BASED_OUTPATIENT_CLINIC_OR_DEPARTMENT_OTHER): Payer: Medicare Other

## 2021-12-01 ENCOUNTER — Other Ambulatory Visit: Payer: Self-pay | Admitting: Internal Medicine

## 2021-12-01 ENCOUNTER — Encounter (HOSPITAL_BASED_OUTPATIENT_CLINIC_OR_DEPARTMENT_OTHER): Payer: Self-pay | Admitting: Student in an Organized Health Care Education/Training Program

## 2021-12-01 DIAGNOSIS — R197 Diarrhea, unspecified: Secondary | ICD-10-CM | POA: Diagnosis not present

## 2021-12-01 DIAGNOSIS — F329 Major depressive disorder, single episode, unspecified: Secondary | ICD-10-CM | POA: Diagnosis present

## 2021-12-01 DIAGNOSIS — G8929 Other chronic pain: Secondary | ICD-10-CM | POA: Diagnosis present

## 2021-12-01 DIAGNOSIS — C689 Malignant neoplasm of urinary organ, unspecified: Secondary | ICD-10-CM

## 2021-12-01 DIAGNOSIS — M1711 Unilateral primary osteoarthritis, right knee: Secondary | ICD-10-CM

## 2021-12-01 DIAGNOSIS — Z981 Arthrodesis status: Secondary | ICD-10-CM

## 2021-12-01 DIAGNOSIS — Z880 Allergy status to penicillin: Secondary | ICD-10-CM

## 2021-12-01 DIAGNOSIS — K5900 Constipation, unspecified: Secondary | ICD-10-CM | POA: Diagnosis present

## 2021-12-01 DIAGNOSIS — N1330 Unspecified hydronephrosis: Secondary | ICD-10-CM | POA: Diagnosis present

## 2021-12-01 DIAGNOSIS — Z8551 Personal history of malignant neoplasm of bladder: Secondary | ICD-10-CM

## 2021-12-01 DIAGNOSIS — I10 Essential (primary) hypertension: Secondary | ICD-10-CM | POA: Diagnosis present

## 2021-12-01 DIAGNOSIS — Z8554 Personal history of malignant neoplasm of ureter: Secondary | ICD-10-CM

## 2021-12-01 DIAGNOSIS — M109 Gout, unspecified: Secondary | ICD-10-CM | POA: Diagnosis present

## 2021-12-01 DIAGNOSIS — Z9049 Acquired absence of other specified parts of digestive tract: Secondary | ICD-10-CM

## 2021-12-01 DIAGNOSIS — R5383 Other fatigue: Secondary | ICD-10-CM | POA: Diagnosis present

## 2021-12-01 DIAGNOSIS — M5416 Radiculopathy, lumbar region: Secondary | ICD-10-CM | POA: Diagnosis present

## 2021-12-01 DIAGNOSIS — J9811 Atelectasis: Secondary | ICD-10-CM | POA: Diagnosis present

## 2021-12-01 DIAGNOSIS — R339 Retention of urine, unspecified: Secondary | ICD-10-CM | POA: Diagnosis present

## 2021-12-01 DIAGNOSIS — Z9889 Other specified postprocedural states: Secondary | ICD-10-CM

## 2021-12-01 DIAGNOSIS — Z79899 Other long term (current) drug therapy: Secondary | ICD-10-CM

## 2021-12-01 DIAGNOSIS — J189 Pneumonia, unspecified organism: Secondary | ICD-10-CM

## 2021-12-01 DIAGNOSIS — Z9221 Personal history of antineoplastic chemotherapy: Secondary | ICD-10-CM

## 2021-12-01 DIAGNOSIS — Z5320 Procedure and treatment not carried out because of patient's decision for unspecified reasons: Secondary | ICD-10-CM | POA: Diagnosis not present

## 2021-12-01 DIAGNOSIS — Z905 Acquired absence of kidney: Secondary | ICD-10-CM

## 2021-12-01 DIAGNOSIS — M130 Polyarthritis, unspecified: Secondary | ICD-10-CM | POA: Diagnosis present

## 2021-12-01 DIAGNOSIS — J9601 Acute respiratory failure with hypoxia: Secondary | ICD-10-CM | POA: Diagnosis present

## 2021-12-01 DIAGNOSIS — Z882 Allergy status to sulfonamides status: Secondary | ICD-10-CM

## 2021-12-01 DIAGNOSIS — U071 COVID-19: Principal | ICD-10-CM | POA: Diagnosis present

## 2021-12-01 DIAGNOSIS — C662 Malignant neoplasm of left ureter: Secondary | ICD-10-CM | POA: Diagnosis present

## 2021-12-01 DIAGNOSIS — Z7409 Other reduced mobility: Secondary | ICD-10-CM

## 2021-12-01 DIAGNOSIS — J984 Other disorders of lung: Secondary | ICD-10-CM | POA: Diagnosis present

## 2021-12-01 DIAGNOSIS — Z87442 Personal history of urinary calculi: Secondary | ICD-10-CM

## 2021-12-01 DIAGNOSIS — Z9079 Acquired absence of other genital organ(s): Secondary | ICD-10-CM

## 2021-12-01 DIAGNOSIS — N320 Bladder-neck obstruction: Secondary | ICD-10-CM | POA: Diagnosis present

## 2021-12-01 DIAGNOSIS — J1282 Pneumonia due to coronavirus disease 2019: Secondary | ICD-10-CM | POA: Diagnosis present

## 2021-12-01 DIAGNOSIS — M79604 Pain in right leg: Secondary | ICD-10-CM

## 2021-12-01 DIAGNOSIS — Z2889 Immunization not carried out for other reason: Secondary | ICD-10-CM

## 2021-12-01 DIAGNOSIS — R338 Other retention of urine: Secondary | ICD-10-CM

## 2021-12-01 LAB — CBC WITH DIFF, BLOOD
ANC-Automated: 6 10*3/uL (ref 1.6–7.0)
Abs Basophils: 0 10*3/uL (ref <0.2)
Abs Eosinophils: 0 10*3/uL (ref 0.0–0.5)
Abs Lymphs: 1.2 10*3/uL (ref 0.8–3.1)
Abs Monos: 0.7 10*3/uL (ref 0.2–0.8)
Basophils: 0 %
Eosinophils: 0 %
Hct: 31.8 % — ABNORMAL LOW (ref 40.0–50.0)
Hgb: 11 gm/dL — ABNORMAL LOW (ref 13.7–17.5)
Lymphocytes: 15 %
MCH: 31 pg (ref 26.0–32.0)
MCHC: 34.6 g/dL (ref 32.0–36.0)
MCV: 89.6 um3 (ref 79.0–95.0)
MPV: 9.5 fL (ref 9.4–12.4)
Monocytes: 9 %
Plt Count: 205 10*3/uL (ref 140–370)
RBC: 3.55 10*6/uL — ABNORMAL LOW (ref 4.60–6.10)
RDW: 13.4 % (ref 12.0–14.0)
Segs: 76 %
WBC: 7.9 10*3/uL (ref 4.0–10.0)

## 2021-12-01 LAB — COMPREHENSIVE METABOLIC PANEL, BLOOD
ALT (SGPT): 7 U/L (ref 0–41)
AST (SGOT): 14 U/L (ref 0–40)
Albumin: 3.9 g/dL (ref 3.5–5.2)
Alkaline Phos: 101 U/L (ref 40–129)
Anion Gap: 16 mmol/L — ABNORMAL HIGH (ref 7–15)
BUN: 16 mg/dL (ref 8–23)
Bicarbonate: 23 mmol/L (ref 22–29)
Bilirubin, Tot: 0.96 mg/dL (ref <1.2)
Calcium: 8.5 mg/dL (ref 8.5–10.6)
Chloride: 97 mmol/L — ABNORMAL LOW (ref 98–107)
Creatinine: 1.1 mg/dL (ref 0.67–1.17)
Glucose: 178 mg/dL — ABNORMAL HIGH (ref 70–99)
Potassium: 4 mmol/L (ref 3.5–5.1)
Sodium: 136 mmol/L (ref 136–145)
Total Protein: 6.5 g/dL (ref 6.0–8.0)
eGFR Based on CKD-EPI 2021 Equation: >60 mL/min

## 2021-12-01 LAB — INFLUENZA A/B & SARS-COV-2 PCR COMBO FOR RAPID RESPONSE LAB
Influenza A PCR, RRL: NOT DETECTED
Influenza B PCR, RRL: NOT DETECTED
SARS-CoV-2 PCR, RRL: DETECTED — AB

## 2021-12-01 LAB — URINALYSIS WITH CULTURE REFLEX, WHEN INDICATED
Bilirubin: NEGATIVE
Blood: NEGATIVE
Glucose: NEGATIVE
Hyaline Cast: >5 — AB (ref 0–?)
Ketones: NEGATIVE
Leuk Esterase: NEGATIVE Leu/uL
Nitrite: NEGATIVE
Specific Gravity: 1.016 (ref 1.002–1.030)
Urobilinogen: NEGATIVE
pH: 5.5 (ref 5.0–8.0)

## 2021-12-01 MED ORDER — ONDANSETRON 4 MG OR TBDP
8.0000 mg | ORAL_TABLET | Freq: Three times a day (TID) | ORAL | Status: DC | PRN
Start: 2021-12-01 — End: 2021-12-06
  Administered 2021-12-01 – 2021-12-05 (×10): 8 mg via ORAL
  Filled 2021-12-01 (×9): qty 2

## 2021-12-01 MED ORDER — DOCUSATE SODIUM 100 MG OR CAPS
100.0000 mg | ORAL_CAPSULE | Freq: Three times a day (TID) | ORAL | Status: DC
Start: 2021-12-02 — End: 2021-12-06
  Administered 2021-12-02 – 2021-12-05 (×7): 100 mg via ORAL
  Filled 2021-12-01 (×6): qty 1

## 2021-12-01 MED ORDER — AMLODIPINE 10 MG OR TABS
10.0000 mg | ORAL_TABLET | Freq: Every day | ORAL | Status: DC
Start: 2021-12-02 — End: 2021-12-06
  Filled 2021-12-01: qty 2

## 2021-12-01 MED ORDER — IPRATROPIUM-ALBUTEROL 0.5-2.5 (3) MG/3ML IN SOLN
3.0000 mL | Freq: Four times a day (QID) | RESPIRATORY_TRACT | Status: DC | PRN
Start: 2021-12-01 — End: 2021-12-06
  Administered 2021-12-02 (×2): 3 mL via RESPIRATORY_TRACT
  Filled 2021-12-01: qty 1

## 2021-12-01 MED ORDER — ALLOPURINOL 100 MG OR TABS
300.0000 mg | ORAL_TABLET | Freq: Every day | ORAL | Status: DC
Start: 2021-12-02 — End: 2021-12-06
  Administered 2021-12-02 – 2021-12-05 (×5): 300 mg via ORAL
  Filled 2021-12-01 (×4): qty 3

## 2021-12-01 MED ORDER — PREGABALIN 100 MG OR CAPS
100.0000 mg | ORAL_CAPSULE | Freq: Every evening | ORAL | Status: DC
Start: 2021-12-01 — End: 2021-12-06
  Administered 2021-12-01 – 2021-12-05 (×6): 100 mg via ORAL
  Filled 2021-12-01 (×4): qty 2
  Filled 2021-12-01: qty 1

## 2021-12-01 MED ORDER — ALBUTEROL SULFATE 108 (90 BASE) MCG/ACT IN AERS
2.0000 | INHALATION_SPRAY | Freq: Once | RESPIRATORY_TRACT | Status: AC
Start: 2021-12-01 — End: 2021-12-01
  Administered 2021-12-01 (×2): 2 via RESPIRATORY_TRACT
  Filled 2021-12-01: qty 6.7

## 2021-12-01 MED ORDER — ALBUTEROL SULFATE 108 (90 BASE) MCG/ACT IN AERS
2.0000 | INHALATION_SPRAY | RESPIRATORY_TRACT | 0 refills | Status: DC | PRN
Start: 2021-12-01 — End: 2022-09-29
  Filled 2021-12-02: qty 6.7, 16d supply, fill #0

## 2021-12-01 MED ORDER — SODIUM CHLORIDE 0.9 % IV SOLN
100.0000 mg | INTRAVENOUS | Status: DC
Start: 2021-12-02 — End: 2021-12-05
  Administered 2021-12-02 – 2021-12-04 (×4): 100 mg via INTRAVENOUS
  Filled 2021-12-01 (×3): qty 100

## 2021-12-01 MED ORDER — DULOXETINE HCL 20 MG OR CPEP
20.0000 mg | ORAL_CAPSULE | Freq: Every day | ORAL | Status: DC
Start: 2021-12-02 — End: 2021-12-06
  Administered 2021-12-02 – 2021-12-05 (×5): 20 mg via ORAL
  Filled 2021-12-01 (×4): qty 1

## 2021-12-01 MED ORDER — HYDROCODONE-ACETAMINOPHEN 5-325 MG OR TABS
1.0000 | ORAL_TABLET | Freq: Once | ORAL | Status: AC
Start: 2021-12-01 — End: 2021-12-01
  Administered 2021-12-01 (×2): 1 via ORAL
  Filled 2021-12-01: qty 1

## 2021-12-01 MED ORDER — SODIUM CHLORIDE 0.9 % IV SOLN
200.0000 mg | Freq: Once | INTRAVENOUS | Status: AC
Start: 2021-12-01 — End: 2021-12-01
  Administered 2021-12-01 (×2): 200 mg via INTRAVENOUS
  Filled 2021-12-01: qty 200

## 2021-12-01 MED ORDER — SODIUM CHLORIDE 0.9% TKO INFUSION
INTRAVENOUS | Status: DC | PRN
Start: 2021-12-01 — End: 2021-12-06

## 2021-12-01 MED ORDER — OXYCODONE HCL 5 MG OR TABS
5.0000 mg | ORAL_TABLET | Freq: Four times a day (QID) | ORAL | Status: DC | PRN
Start: 2021-12-01 — End: 2021-12-06
  Administered 2021-12-05 (×2): 5 mg via ORAL
  Filled 2021-12-01: qty 1

## 2021-12-01 MED ORDER — ONDANSETRON 4 MG OR TBDP
4.0000 mg | ORAL_TABLET | Freq: Once | ORAL | Status: AC
Start: 2021-12-01 — End: 2021-12-01
  Administered 2021-12-01 (×2): 4 mg via ORAL
  Filled 2021-12-01: qty 1

## 2021-12-01 MED ORDER — SODIUM CHLORIDE 0.9 % IJ SOLN (CUSTOM)
3.0000 mL | Freq: Three times a day (TID) | INTRAMUSCULAR | Status: DC
Start: 2021-12-01 — End: 2021-12-06
  Administered 2021-12-01 – 2021-12-06 (×14): 3 mL via INTRAVENOUS

## 2021-12-01 MED ORDER — SODIUM CHLORIDE 0.9 % IJ SOLN (CUSTOM)
3.0000 mL | INTRAMUSCULAR | Status: DC | PRN
Start: 2021-12-01 — End: 2021-12-06

## 2021-12-01 MED ORDER — HEPARIN SODIUM (PORCINE) 5000 UNIT/ML IJ SOLN
5000.0000 [IU] | Freq: Three times a day (TID) | INTRAMUSCULAR | Status: DC
Start: 2021-12-01 — End: 2021-12-06
  Administered 2021-12-01 – 2021-12-06 (×15): 5000 [IU] via SUBCUTANEOUS
  Filled 2021-12-01 (×14): qty 1

## 2021-12-01 MED ORDER — DEXAMETHASONE 2 MG OR TABS
6.0000 mg | ORAL_TABLET | Freq: Every day | ORAL | Status: DC
Start: 2021-12-02 — End: 2021-12-02

## 2021-12-01 NOTE — ED Floor Report (Signed)
ED to IP Handoff    Report created by Jearld Fenton, RN at 9:43 PM 12/01/2021.     HANDOFF REPORT UPDATE/CHANGES (changes in patient status/care/events prior to transfer)  By who:  Time:   Additional information:                                                                                                                                                     Stephen [name] is a 70 year old male.    Brief Summary of ED Visit (to include focused assessment and neuro status):    Pt presented via [facility] with a c/o RLE pain as well as decreased urinary output.  Pt's hx includes spinal surgery 11/08/2021 L2-L4 interbody fusion (steri strips are dry and intact).   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  Post void residual was 400cc therefore a foley catheter was placed, currently connected to a leg bag.   Pt was found to be Covid (+) and his room air saturations were consistently 87% and 91-93% on 3 liters of oxygen.  RN shift assessment exceptions to WDL:  n/a    Any significant events and interventions with responses:  n/a    Radiologic studies not completed: n/a  (None unless otherwise noted)    Chief Complaint   Patient presents with    Leg Pain     R leg pain x "a while" with recent worsening, unable to get comfortable at home and sleep. POC glucose 262. Denies recent trauma/injury to extremity, did have procedure recently.        Admitted for: Covid    Code Status:  Please refer to In-pt admitting doctors orders     Level of Care: MST    Sepsis Section:    Is patient septic? n/af yes, complete below:    BC x 2 drawn? yes If No explain:  n/a    Repeat lactate needed? N/aIf Yes, when is it due?  N/a    Have TWO Blood Pressures (with MAPs) been documented AFTER crystalloids completed? N/a    All initial antibiotics given?  N/a If No, explain:  n/a    Amount of IV fluids received 0  mL  _________________________________________________________________    Is patient on Heparin? N/aIf yes, complete below:     Time Heparin bolus was given: n/a    Additional drips patient is on: n/a    Cardiac rhythm: Sinus Rhythm    Oxygen Delivery: n/a    Past Medical History:   Diagnosis Date    Chronic back pain     Congenital hydronephrosis     Gout     Headache     Hematuria     Kidney disease  Kidney stones     Major depressive disorder, single episode     Polyarthropathy or polyarthritis of multiple sites     Retinal detachment     Urethral stricture        Past Surgical History:   Procedure Laterality Date    CT INSERTION OF SUPRAPUBIC CATH  09/25/2015    NEPHRECTOMY Right 1995    APPENDECTOMY      COLONOSCOPY      CYSTOSCOPY      CYSTOSCOPY W/ LASER LITHOTRIPSY      OTHER SURGICAL HISTORY      Interstim 01/29/2011    SPINE SURGERY  09/21    Lumbar-sacral fusion    TRANSURETHRAL RESECTION OF PROSTATE         Allergies: Amoxicillin and Sulfa drugs    ED Fall Risk: No    Skin issues:  n/a    >> If yes, note areas of skin breakdown. See appropriate photos.      Ambulatory:  yes    Sitter needed:n/a    CSSRS Suicide Risk Level Screening in Triage: n/a    Does this patient have a history of aggressive behavior and/or is this patient a green banner patient? N/a    Isolation Required: n/a    >> If yes , what type of isolation: n/a    Is patient in custody? N/a    Is patient in restraints? N/a    Swallow Screen:  n/a    Swallow Screen Result:  n/a    Was the patient made NPO & a Speech Language Pathology Consult Ordered? N/a    Vitals:    12/01/21 1938 12/01/21 1939 12/01/21 2043 12/01/21 2123   BP:  122/57 114/52 139/72   BP Location:   Right arm Left arm   BP Patient Position:   Sitting Semi-Fowlers   Pulse:   82 72   Resp:   22 18   Temp:       SpO2: (!) 87% 94% 93% 99%   Weight:       Height:                Lab Results   Component Value Date    WBC 7.9 12/01/2021    RBC 3.55 (L) 12/01/2021    HGB 11.0 (L)  12/01/2021    HCT 31.8 (L) 12/01/2021    MCV 89.6 12/01/2021    MCHC 34.6 12/01/2021    RDW 13.4 12/01/2021    PLT 205 12/01/2021    MPV 9.5 12/01/2021       Lab Results   Component Value Date    NA 136 12/01/2021    K 4.0 12/01/2021    CL 97 (L) 12/01/2021    BICARB 23 12/01/2021    BUN 16 12/01/2021    CREAT 1.10 12/01/2021    GLU 178 (H) 12/01/2021    [place] 8.5 12/01/2021       No results found for: BNP, PHOS, MG, LACTATE, AMMONIA, IONCA, ARTIONCA    No results found for: CPK, CKMBH, TROPONIN    No results found for: PH, PCO2, O2CONTENT, IVHC3, IVBE, O2SAT, UNPH, UNPCO2, ARTPH, ARTPCO2, ARTO2CNT, IAHC3, IABE, ARTO2SAT, UNAPH, UNAPCO2    No results found for this visit on 12/01/21.      Patient Lines/Drains/Airways Status       Active PICC Line / CVC Line / PIV Line / Drain / Airway / Intraosseous Line / Epidural Line / ART Line / Line Type / Wound /  Pressure Ulcer Injury       Name Placement date Placement time Site Days    Peripheral IV - 20 G Left Forearm 12/01/21  1422  Forearm  less than 1    Indwelling Urinary Catheter -  12/01/21 RN Coude 16 fr 10 ml Yes 12/01/21  1631  Coude  less than 1                        ED Handoff Report is ready for review.  Admitting RN may reach Emergency Department RN, Jearld Fenton, RN, at [phone] with any questions.

## 2021-12-01 NOTE — ED Notes (Signed)
ERMD at bedside.   Pt co feeling he is retaining urine.   Bladder scanner performed  337ml

## 2021-12-01 NOTE — Consults (Signed)
[facility]  Urology Consult Note    Patient Name: Stephen [name] MRN: [id number] Room#: 18/18     Requesting physician: Dr. Sigmund Hazel, Clint Lipps, *    Reason for Consult: Urinary retention    History of Present Illness: Stephen [name] is a 70 year old male with hx solitary left kidney, UTUC s/p Jelmyto instillations 08/06/21-09/09/21 presenting with acute urinary retention and fatigue.    Patient states he has only been able to pee drops at a time for the past week. Denies hematuria, fevers, n/v. No flank pain.    ED course:  HDS,AF  + Covid  Cr 1.17 (~baseline)  UA bland  No leukocytosis  RBUS mild-mod L hydronephrosis, similar to prior      Past Medical History:   Diagnosis Date   . Chronic back pain    . Congenital hydronephrosis    . Gout    . Headache    . Hematuria    . Kidney disease    . Kidney stones    . Major depressive disorder, single episode    . Polyarthropathy or polyarthritis of multiple sites    . Retinal detachment    . Urethral stricture        Past Surgical History:   Procedure Laterality Date   . CT INSERTION OF SUPRAPUBIC CATH  09/25/2015   . NEPHRECTOMY Right 1995   . APPENDECTOMY     . COLONOSCOPY     . CYSTOSCOPY     . CYSTOSCOPY W/ LASER LITHOTRIPSY     . OTHER SURGICAL HISTORY      Interstim 01/29/2011   . SPINE SURGERY  09/21    Lumbar-sacral fusion   . TRANSURETHRAL RESECTION OF PROSTATE         No current facility-administered medications on file prior to encounter.     Current Outpatient Medications on File Prior to Encounter   Medication Sig Dispense Refill   . ALAWAY 0.025 % ophthalmic solution INSTILL 1 DROP INTO BOTH EYES TWICE A DAY AS DIRECTED     . allopurinol (ZYLOPRIM) 300 MG tablet Take 300 mg by mouth daily.     Marland Kitchen amLODIPINE (NORVASC) 5 MG tablet Take 10 mg by mouth daily.     Marland Kitchen ascorbic acid (VITAMIN C) 500 MG/5ML suspension      . docusate sodium (COLACE) 100 MG capsule Take 1 capsule (100 mg) by mouth 3 times daily for 5 days. 15 capsule 0   . DULoxetine  (CYMBALTA) 20 MG CR capsule Take 1 capsule (20 mg) by mouth daily. 90 capsule 3   . famotidine (PEPCID) 20 MG tablet Take 1 tablet (20 mg) by mouth every 12 hours. 60 tablet 0   . [DISCONTINUED] gabapentin (NEURONTIN) 100 MG capsule Take 1 capsule (100 mg) by mouth 3 times daily for 7 days. Start taking after your procedure is completed. 21 capsule 0   . lidocaine (ASPERCREME) 4 % patch Apply 1 patch topically every 24 hours. Leave patch on for 12 hours, then remove for 12 hours. 15 patch 0   . LORazepam (ATIVAN) 1 MG tablet Take 1 tablet (1 mg) by mouth every 6 hours as needed for Anxiety. 2 tablet 0   . naloxone (NARCAN) 4 mg/0.1 mL nasal spray For suspected opioid overdose, call 911! Then spray once in one nostril. Repeat after 3 minutes if no or minimal response using a new spray in other nostril. 2 each 0   . oxyCODONE (ROXICODONE) 5  MG immediate release tablet Take 1 tablet (5 mg) by mouth 2 times daily as needed for Moderate Pain (Pain Score 4-6) or Severe Pain (Pain Score 7-10). 30 tablet 0   . Polyethyl Glycol-Propyl Glycol (SYSTANE ULTRA OP)      . pregabalin (LYRICA) 100 MG capsule TAKE 1 CAPSULE BY MOUTH EVERYDAY AT BEDTIME 30 capsule 0   . senna (SENOKOT) 8.6 MG tablet Take 1 tablet (8.6 mg) by mouth daily. 30 tablet 0   . triamcinolone (KENALOG) 0.1 % cream Apply 1 Application topically 2 times daily. Apply a thin layer as directed     . VITAMIN D PO Take 125 mg by mouth daily.     Marland Kitchen zolpidem (AMBIEN) 5 MG tablet Take 1 tablet (5 mg) by mouth nightly as needed for Insomnia. 30 tablet 0       Allergies   Allergen Reactions   . Amoxicillin Rash   . Sulfa Drugs Unspecified        Family History   Adopted: Yes   Family history unknown: Yes        Social History:  Tobacco: The patient reports that he has never smoked. He has never used smokeless tobacco.  Alcohol: The patient reports no history of alcohol use.  Drugs: The patient reports that he does not currently use drugs.    Review of Systems:   Per  HPI    Physical Exam:  BP 115/55   Pulse 89   Temp 99.6 F (37.6 C)   Resp 18   Ht '5\' 6"'  (1.676 m)   Wt 90 kg (198 lb 6.6 oz)   SpO2 93%   BMI 32.02 kg/m    GENERAL: Pleasant, cooperative and in no acute distress.   NEURO: Alert and Oriented x 3  HEENT: normalcephalic/atraumatic  NECK: midline trachea  PULM: non-labored breathing  GI: soft, NT, ND  BACK: No CVAT  GU: FC CYU.  EXT: warm and well perfused.     Laboratory :  CBC  Recent Labs     12/01/21  1329   WBC 7.9   HGB 11.0*   HCT 31.8*   PLT 205   SEG 76   LYMPHS 15   MONOS 9      Chemistry  Recent Labs     12/01/21  1329   NA 136   K 4.0   CL 97*   BICARB 23   BUN 16   CREAT 1.10   GLU 178*   [place] 8.5     Recent Labs     12/01/21  1329   ALK 101   AST 14   ALT 7   TBILI 0.96   ALB 3.9        Coags  No results for input(s): PT, PTT, INR in the last 72 hours.  UA  Lab Results   Component Value Date    COLORUA Light-Yellow 12/01/2021    APPEARUA Clear 12/01/2021    GLUCOSEUA Negative 12/01/2021    BILIUA Negative 12/01/2021    KETONEUA Negative 12/01/2021    SGUA 1.016 12/01/2021    BLOODUA Negative 12/01/2021    PHUA 5.5 12/01/2021    PROTEINUA Trace (A) 12/01/2021    UROBILUA Negative 12/01/2021    NITRITEUA Negative 12/01/2021    LEUKESTUA Negative 12/01/2021    WBCUA 3-5 (A) 12/01/2021    RBCUA 0-2 12/01/2021    HYALINEUA >5 (A) 12/01/2021       Microbiology:   Follow up  Radiology:  X-Ray Knee 1 Or 2 Views - Right    Result Date: 12/01/2021  Narrative: EXAM DESCRIPTION:  X-RAY KNEE 1 OR 2 VIEWS - RIGHT CLINICAL HISTORY: May need seen injury. COMPARISON: Concurrently performed lower extremity duplex ultrasound FINDINGS: Minimal tricompartmental degenerative changes with tiny marginal osteophytes. No acute fracture or dislocation.  No knee joint effusion.    Signed by: Lubertha South 12/01/2021 17:28:11    Impression: IMPRESSION: Minimal tricompartmental degeneration of the knee joint.    X-Ray Fluoroscopy Up To 1 Hr - OR    Result Date:  11/08/2021  Narrative: This exam was to provide Fluoroscopic Guidance.  No Radiologist interpretation will be given.    [name] Lower Extremity Venous Duplex Bilateral    Result Date: 12/01/2021  Narrative: EXAM DESCRIPTION: [name] LOWER EXTREMITY VENOUS DUPLEX BILATERAL CLINICAL HISTORY: Right leg pain. Recent procedure. TECHNIQUE: Real time grey scale and color Doppler were performed. Spectral Doppler waveforms were obtained. COMPARISON: None. FINDINGS: RIGHT: Common femoral vein: Normal compressibility and flow. Deep femoral vein: Normal flow Greater saphenous vein: Normal compressibility and flow. Femoral vein: Normal compressibility and flow. Popliteal vein: Normal compressibility and flow. LEFT: Common femoral vein: Normal compressibility and flow. Deep femoral vein: Normal flow Greater saphenous vein: Normal compressibility and flow. Femoral vein: Normal compressibility and flow. Popliteal vein: Normal compressibility and flow. Color flow was normal. Spectral waveforms are within normal limits. CONCURRENT SUPERVISION: I have reviewed the images and agree with the resident's interpretation.    Preliminary created by: Wilfred Lacy     Impression: IMPRESSION: No evidence of deep venous thrombosis in the examined veins of the bilateral lower extremities.     X-Ray Chest Single View    Result Date: 11/04/2021  Narrative: EXAM DESCRIPTION: X-RAY CHEST SINGLE VIEW CLINICAL HISTORY: Preoperative evaluation COMPARISON: 03/03/2018 TECHNIQUE: Frontal chest radiograph. FINDINGS: Lungs are well expanded and clear. No pleural effusion or pneumothorax. Normal trachea and hilar regions. Cardiomediastinal silhouette is within normal limits. Mild aortic calcifications. No acute osseous abnormality.    Signed by: Rolly Salter 11/04/2021 16:07:30    Impression: IMPRESSION: No acute findings.    X-Ray Thoracolumbar Spine 2 Views    Result Date: 11/10/2021  Narrative: EXAM DESCRIPTION: X-RAY THORACOLUMBAR SPINE 2 VIEWS CLINICAL  HISTORY: Postoperative evaluation. TECHNIQUE: Two views of the thoracolumbar spine COMPARISON: Lumbar spine radiographs 11/09/2021 FINDINGS: The spine is imaged from mid T4 to lower T5 on the AP view and from mid T4 to the L4-L5 intervertebral disc space on the lateral view. There is partial visualization of recent revision fusion with extension of posterior instrumented fusion to L2 and interbody cage placement at L2-L3. Surgical drain, postoperative soft tissue swelling, and immature bone graft material remain visible. There is no acute fracture. The vertebral body heights are maintained. Spinal alignment is preserved. There is mild multilevel degenerative disc disease throughout the imaged thoracic spine with multilevel bridging ossification suggestive of diffuse idiopathic skeletal hyperostosis.    Signed by: Dwaine Deter 11/10/2021 15:25:41    Impression: IMPRESSION: No acute fracture of the imaged thoracolumbar spine. Recent revision spinal fusion from L2 to S1 is incompletely imaged on these radiographs and is better assessed on previous day lumbosacral spine radiographs.    X-Ray Lumbosacral Spine 2 Or 3 Views    Result Date: 11/23/2021  Narrative: EXAM DESCRIPTION: X-RAY LUMBOSACRAL SPINE 2 OR 3 VIEWS CLINICAL HISTORY: Lumbar surgery TECHNIQUE: AP and lateral views of the lumbosacral spine COMPARISON: Lumbosacral spine radiographs 11/09/2021 FINDINGS: Again noted are recent changes of  revision spinal fixation with posterior instrumented fixation extending from L2 through S1, recently placed XLIF cage at L2-L3, and pre-existing XLIF PEEK cages at L3-L4 and L4-L5 and ALIF at L5-S1. Immature bone graft material remains visible posteriorly. There has been no interval change in alignment or hardware complication. There is similar mild retrolisthesis of L2 on L3. There is persistent soft tissue swelling. Previously noted surgical drain has been removed. Mild multilevel intervertebral disc height loss in the lower  thoracic spine is similar to the prior imaging. The sacroiliac joints and visible portions of the hip joints are congruent.    Signed by: Dwaine Deter 11/23/2021 16:16:18    Impression: IMPRESSION: No complications at recent revision spinal fusion, with combined interbody and posterior instrumented fixation from L2 through S1.    X-Ray Lumbosacral Spine 2 Or 3 Views    Result Date: 11/09/2021  Narrative: EXAM DESCRIPTION: X-RAY LUMBOSACRAL SPINE 2 OR 3 VIEWS CLINICAL HISTORY: Status post lumbar spine implanted hardware COMPARISON: Multiple prior studies, most recently 10/29/2021 TECHNIQUE: Two views of the lumbar spine taken upright with the patient wearing a TLSO. FINDINGS:  The patient is now status post revision of the previously seen spinal fixation with extension one level superiorly with placement of additional L2 transpedicular fixation with new interconnecting rods posteriorly and placement of XLIF interbody metallic cage at F7/5. No significant change to the more inferior fixation with XLIF interbody PEEK cages at L3/4 and L4/5 and ALIF at L5/S1. drain in situ posteriorly. Partial correction of previously seen L2/3 retrolisthesis.    Signed by: Lynnae Prude 11/09/2021 15:13:36    Impression: IMPRESSION: Satisfactory appearance status post extension of spinal fixation of to L2.    CT L-Spine W/O IV Contrast    Result Date: 11/02/2021  Narrative: EXAM DESCRIPTION: CT L-SPINE W/O IV CONTRAST CLINICAL HISTORY: Acute on chronic lower back pain and acute right L3 radiculopathy. Concern for cauda equinus syndrome. 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 TECHNIQUE: Helical CT was obtained from T12 through S1 without contrast and reconstructed in axial, sagittal, and coronal planes using soft tissue and bone window algorithms. Up-to-date CT equipment and radiation dose reduction techniques were employed. CTDIvol: 34.2 mGy. DLP: 970 mGy-cm. COMPARISON:  MRI of the lumbar spine from 10/29/2021. Radiographs of lumbosacral spine from 10/29/2021. CT urography from 04/22/2021. MRI of the lumbar spine from 01/14/2019. FINDINGS: The bones are demineralized. There are 5 lumbar-type vertebral bodies. Posterior fixation extending bilaterally from L3 through S1 is redemonstrated with anterior interbody grafts without hardware complication. No acute fracture. The graft remains unincorporated. There is persistent grade 1 anterolisthesis of L5 on S1 compared to CT from 04/22/2021. There is moderate intervertebral disc space narrowing at L2-L3 where there is also unchanged, degenerative retrolisthesis of L2 on L3 of 0.7 cm . 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 The partially imaged lung apices are unremarkable. Mild atherosclerotic calcification of the partially imaged abdominal aorta. CONCURRENT SUPERVISION: I have reviewed the images and agree with the fellow's report.    Preliminary created by: Chaney Malling, MeNore Signed by: Pandora Leiter 11/02/2021 15:11:59    Impression: IMPRESSION: Post-surgical changes from prior L3-S1 posterior spinal fusion interbody fusion at these levels. No evidence of hardware complication. No significant change in the alignment compared to the MRI of the lumbar spine from 10/29/2021. Refer to  the report from that exam regarding the degree of spinal canal and neural foraminal stenosis.     US Kidney Complete    Result Date: 12/01/2021  Narrative: EXAM DESCRIPTION: US KIDNEY COMPLETE CLINICAL HISTORY: History of urothelial cancer with prior right nephrectomy and a resection of a renal pelvic filling defect on the left. Evaluate for left-sided hydronephrosis TECHNIQUE: Per Protocol COMPARISON: MR urogram 10/25/2021 FINDINGS: RIGHT KIDNEY: Surgically absent. LEFT KIDNEY: 14.2 cm in long axis.  Persistent mild-to-moderate hydronephrosis, which is overall similar compared to MR 10/25/2021. No obstructing stones visualized. Hydronephrosis slightly improved but persistent on postvoid imaging. Flow is present in the main renal artery and vein. BLADDER: Heavily trabeculated. CONCURRENT SUPERVISION: I have reviewed the images and agree with the resident's interpretation.    Preliminary created by: Johny Blamer     Impression: IMPRESSION: Persistent stable mild-to-moderate hydronephrosis, which is overall similar compared to MR 10/25/2021. No obstructing stones visualized.        Assessment:  Lymon Kidney is a 70 year old male with hx solitary left kidney, UTUC s/p Jelmyto instillations 08/06/21-09/09/21 presenting with acute urinary retention and fatigue.    No e/o UTI or new obstruction. Stable hydronephrosis.    Recommendations:  - No indication for acute urologic intervention.  - Maintain FC x1 week, urology to arrange TOV    Thank you for involving [name] in the care of this patient. We will be available if you have further questions or concerns.    Discussed with chief resident and attending Dr. Sallyanne Kuster.    Alinda Dooms, MD  Urology PGY2    Urology pagers  Please do not hesitate to contact the appropriate service with any questions or concerns.  Prudence Davidson ([facility]): [phone]  [facility]: [phone]

## 2021-12-01 NOTE — Discharge Instructions (Addendum)
Diagnosis and Reason for Admission    You were admitted to the hospital for the following reason(s):  Acute hypoxemic respiratory failure, due to COVID and pneumonia    Your full diagnosis list is located on this After Visit Summary in the Hospital Problems section.    What Happened During [place] Hospital Stay  You were admitted into the hospital for acute shortness of breath and were found to be COVID positive. We performed a chest x-ray in the ED which suggested a pneumonia. It is likely that you developed an overlying bacterial pneumonia which is common in patients who have COVID as it makes them more susceptible to additional infection. We started you on antiviral and steroid treatment for COVID and IV antibiotics for bacterial pneumonia, and saw you improve during the hospital stay. You were eventually transitioned to room air, and have been oxygenating well even while walking.     When you discharge from the hospital, it is important that you:  - Take one more day of Augmentin (two tablets in one day). This will complete the antibiotic course for your pneumonia.   - Follow up with urology to discuss your foley catheter. Referral provided.  - Follow up with orthopedics for post-operative evaluation. Referral provided.   - Follow up with your primary care doctor in 1-2 weeks.     Instructions for After Discharge    Your diet at home should be a regular diet.    Your activity level at home should be:  regular activity.    Specific activity restrictions:    None    Wound or tube care instructions:  None    Your medication list is located on this After Visit Summary in the Current Discharge Medication List section.  Your nurse will review this information with you before you leave the hospital.    It is very important for you to keep a current medication list with you in order to assist your doctors with your medical care.  Bring this After Visit Summary with you to your follow up appointments.    Reasons to Contact  a Doctor Urgently    Call 911 or return to the hospital immediately if:  your breathing worsens, cough worsens, you develop lightheadedness/dizziness, acute difficulty defecating or urinating, acute back pain, numbness/tingling of your legs, fevers/chills.    You should contact either your primary care physician or your hospital physician for any of the following reasons:  any problems taking your medications or above indications.    Your hospital physician at the time of hospital discharge is  Dr. Jodelle Gross .    Your physicians strive to explain things in a way you can understand. If you have any questions about your hospital care, your medications, or if you have new or concerning symptoms soon after going home from the hospital, and you need to contact your hospital physician, they can be contacted in the following manner:     During business hours (Monday-Friday 8 a.m.-5 p.m.), please contact the hospital medicine office at [phone].  Outside of business hours, please contact the [facility] operator at [phone] who can page the on-call physician.       New problems and symptoms unrelated to your hospitalization are best addressed by your primary outpatient physician (PCP), as are requests for medication refills or appointment referrals after hospital discharge.  However, if new issues arise before you can see or contact your PCP, your hospital physician may be able  to assist with these issues during this time.    What Needs to Happen Next After Discharge -- Appointments and Follow Up    Any appointments already scheduled at [facility] clinics will be listed in the Future Appointments section at the top of this After Visit Summary.      Sometimes tests performed in the hospital do not yet have results by the time a patient goes home.  The following key tests will need to be followed up at your next appointment: None    Medical Home Information    Your primary care provider or clinic currently  on file at [facility] is: Areta Haber, Mingo Amber currently have an advance directive or living will on file at [facility]: Yes    Handouts Given to You (if applicable)

## 2021-12-01 NOTE — ED Notes (Signed)
Pts problem list from 11/04/21  ER  Visit    Patient Active Problem List   Diagnosis    Post-traumatic stricture of anterior urethra    Bladder neck contracture    Lumbar radiculopathy    Cervical radiculopathy    Incomplete bladder emptying    Lumbosacral spondylosis without myelopathy    Chronic SI joint pain    Degenerative spondylolisthesis    Lumbar stenosis with neurogenic claudication    Pre-procedure lab exam    Cervical spondylosis without myelopathy    Cervical spondylosis    S/P lumbar fusion    Chronic left SI joint pain    Chronic midline thoracic back pain    Spondylolisthesis at L5-S1 level    Left renal mass    Malignant neoplasm of urinary bladder, unspecified site (CMS-HCC)    Dorsopathy    Ureter malignant neoplasm, left (CMS-HCC)    Cauda equina compression (CMS-HCC

## 2021-12-01 NOTE — ED Notes (Signed)
Repeat vs sat 89-92%   resps even unlabored speaks in full sentences    Dr informed and aware   CXR ordered and albuterol inhaler ordered

## 2021-12-01 NOTE — ED Notes (Signed)
Iv attempt x2 unsuccessfull    COVID swab sent to lab

## 2021-12-01 NOTE — ED Notes (Signed)
Pt aox3 requesting/adamant about sitting at edge of bed   despite possible fall precautions.   Pt with his own rubber soled slippers on.   Pt declines/refuses fall precautions   Pt sitting at edge of bed

## 2021-12-01 NOTE — ED Notes (Signed)
Hand off to [employer]

## 2021-12-01 NOTE — ED Notes (Signed)
Foley bag changed to leg bag.

## 2021-12-01 NOTE — ED Notes (Signed)
To US via gurney.

## 2021-12-01 NOTE — ED Notes (Signed)
Pt COVID +   Charge RN informed Pt moved to room 18.  Droplet/Airborne precautions on door..  Pt informed and aware to wear mask when anyone enters room or around others.

## 2021-12-01 NOTE — H&P (Signed)
MEDICINE SERVICE HISTORY AND PHYSICAL    Attending MD:   Elaina Pattee, MD    Chief Complaint     Low UOP      History of Present Illness   Stephen [name] is a 70 year old male with PMhx of solitary kideny, MDD, Urothelial [place] on recently completed chemotherapy, and recent XLIF & PSIL on 11/21 severe cord compression for  who presented for decreased urinary output    Thinks that the cough had been going on about 1wk. Worse over the past couple of days. Low energy. Denies fevers. Feeling constipated. Endorses nausea and poor appetite. Bothered by sore throat.hadn't noted any other changes    Overall felt that he was doing well after the surgery.     In the ED, HR 89, BP 115/55, RR 18, afebrile. Bladder scan demonstrated ~400cc retained urine and a foley was placed. It was noted that he was hypoxic and COVID + and he was admitted for further manatement    Patient Background     Past Medical and Surgical History:  Past Medical History:   Diagnosis Date   . Chronic back pain    . Congenital hydronephrosis    . Gout    . Headache    . Hematuria    . Kidney disease    . Kidney stones    . Major depressive disorder, single episode    . Polyarthropathy or polyarthritis of multiple sites    . Retinal detachment    . Urethral stricture      Allergies:  Allergies   Allergen Reactions   . Amoxicillin Rash   . Sulfa Drugs Unspecified       Medications:  Prior to Admission Medications   Prescriptions Last Dose Informant Patient Reported? Taking?   ALAWAY 0.025 % ophthalmic solution   Yes No   Sig: INSTILL 1 DROP INTO BOTH EYES TWICE A DAY AS DIRECTED   DULoxetine (CYMBALTA) 20 MG CR capsule   No No   Sig: Take 1 capsule (20 mg) by mouth daily.   LORazepam (ATIVAN) 1 MG tablet   No No   Sig: Take 1 tablet (1 mg) by mouth every 6 hours as needed for Anxiety.   Polyethyl Glycol-Propyl Glycol (SYSTANE ULTRA OP)   Yes No   VITAMIN D PO   Yes No   Sig: Take 125 mg by mouth daily.   allopurinol (ZYLOPRIM) 300 MG tablet   Yes No   Sig:  Take 300 mg by mouth daily.   amLODIPINE (NORVASC) 5 MG tablet   Yes No   Sig: Take 10 mg by mouth daily.   ascorbic acid (VITAMIN C) 500 MG/5ML suspension   Yes No   docusate sodium (COLACE) 100 MG capsule   No No   Sig: Take 1 capsule (100 mg) by mouth 3 times daily for 5 days.   famotidine (PEPCID) 20 MG tablet   No No   Sig: Take 1 tablet (20 mg) by mouth every 12 hours.   lidocaine (ASPERCREME) 4 % patch   No No   Sig: Apply 1 patch topically every 24 hours. Leave patch on for 12 hours, then remove for 12 hours.   naloxone (NARCAN) 4 mg/0.1 mL nasal spray   No No   Sig: For suspected opioid overdose, call 911! Then spray once in one nostril. Repeat after 3 minutes if no or minimal response using a new spray in other nostril.   oxyCODONE (ROXICODONE) 5 MG  immediate release tablet   No No   Sig: Take 1 tablet (5 mg) by mouth 2 times daily as needed for Moderate Pain (Pain Score 4-6) or Severe Pain (Pain Score 7-10).   pregabalin (LYRICA) 100 MG capsule   No No   Sig: TAKE 1 CAPSULE BY MOUTH EVERYDAY AT BEDTIME   senna (SENOKOT) 8.6 MG tablet   No No   Sig: Take 1 tablet (8.6 mg) by mouth daily.   triamcinolone (KENALOG) 0.1 % cream   Yes No   Sig: Apply 1 Application topically 2 times daily. Apply a thin layer as directed   zolpidem (AMBIEN) 5 MG tablet   No No   Sig: Take 1 tablet (5 mg) by mouth nightly as needed for Insomnia.      Facility-Administered Medications: None     Social History:  He reports that he has never smoked. He has never used smokeless tobacco.  He reports no history of alcohol use.  He reports that he does not currently use drugs.    Family History:  Family History   Adopted: Yes   Family history unknown: Yes     Review of Systems:  Review of Systems   Constitutional: Positive for malaise/fatigue. Negative for chills and fever.   Respiratory: Positive for cough, sputum production and shortness of breath.    Cardiovascular: Negative for chest pain, palpitations and orthopnea.    Genitourinary: Negative for dysuria and urgency.       Physical Exam       BP 122/57   Pulse 68   Temp 99.6 F (37.6 C)   Resp 24   Ht '5\' 6"'  (1.676 m)   Wt 90 kg (198 lb 6.6 oz)   SpO2 94%   BMI 32.02 kg/m   Physical Exam  Constitutional:       General: He is awake. He is not in acute distress.     Appearance: Normal appearance. He is well-developed. He is not ill-appearing or toxic-appearing.   HENT:      Head: Normocephalic and atraumatic.   Eyes:      Extraocular Movements: Extraocular movements intact.      Pupils: Pupils are equal, round, and reactive to light.   Pulmonary:      Effort: Pulmonary effort is normal.      Breath sounds: Wheezing, rhonchi and rales present.   Musculoskeletal:         General: Normal range of motion.      Cervical back: Normal range of motion.   Neurological:      General: No focal deficit present.      Mental Status: He is alert and oriented to person, place, and time. Mental status is at baseline.   Psychiatric:         Mood and Affect: Mood normal.         Behavior: Behavior normal. Behavior is cooperative.         Thought Content: Thought content normal.         Judgment: Judgment normal.         Data   Labs  WBC 7.9 (12/14) HGB 11.0* (12/14) PLT 205 (12/14)    HCT 31.8* (12/14)      %Neutrophils 76 (12/14) %Bands   %Lymphs 15 (12/14) %Monos 9 (12/14) %Eos 0 (12/14) %Basos 0 (12/14)  Na 136 (12/14) CL 97* (12/14) BUN 16 (12/14) GLU   178* (12/14)   K 4.0 (12/14) CO2 23 (12/14) Cr 1.10 (12/14)  Dean 8.5 (12/14) Mg   Phos    TP 6.5 (12/14) ALT 7 (12/14) TBILI 0.96 (12/14) ALK PHOS  101 (12/14)   ALB 3.9 (12/14) AST 14 (12/14) DBILI        PT   PTT     INR         Urinalysis  pH 5.5 (12/14) Spec Grav 1.016 (12/14) Glucose Negative (12/14) Ketones Negative (12/14)   Blood Negative (12/14) Protein Trace* (12/14) Urobilinogen Negative (12/14) Leuk Est Negative (12/14)   Nitrite Negative (12/14) WBCs 3-5* (12/14) RBC 0-2 (12/14) Bacteria Rare (12/14)   Epith Cells    Crystals   Comments        Imaging:  X-Ray Knee 1 Or 2 Views - Right    Result Date: 12/01/2021  IMPRESSION: Minimal tricompartmental degeneration of the knee joint.    [name] Lower Extremity Venous Duplex Bilateral    Result Date: 12/01/2021  IMPRESSION: No evidence of deep venous thrombosis in the examined veins of the bilateral lower extremities.     US Kidney Complete    Result Date: 12/01/2021  IMPRESSION: Persistent stable mild-to-moderate hydronephrosis, which is overall similar compared to MR 10/25/2021. No obstructing stones visualized.       Assessment and Care Plan   Stephen [name] is a 70 year old male with PMhx of HTN, solitary kideny, MDD, Gout Urothelial [place] on recently completed chemotherapy, and recent XLIF & PSIL on 11/21 severe cord compression for  who presented for decreased urinary output and was admitted for incidentally found COVID PNA    #COVID  1wk of symptoms prior to presentation. Noted to be hypoxic  - Start remdesivir/Dexamethasone  - Contact precautions    #urinary retention  #L urothelial [place] recently completed chemotherapy  - urology consulted in ED and now with Coude catheter  - Appreciate Urology assistance, will f/u note    #Low back pain  - cont home Cymbalta, lyrica    #Gout  - Cont home allopurinol    #HTN  - Cont home amlodipine    Inpatient Checklist  Patient Lines/Drains/Airways Status     Active PICC Line / CVC Line / PIV Line / Drain / Airway / Intraosseous Line / Epidural Line / ART Line / Line Type / Wound / Pressure Ulcer Injury     Name Placement date Placement time Site Days    Peripheral IV - 20 G Left Forearm 12/01/21  1422  Forearm  less than 1    Indwelling Urinary Catheter -  12/01/21 RN Coude 16 fr 10 ml Yes 12/01/21  1631  Coude  less than 1              VTE prophylaxis: SQ heparin  Diet: Diet NPO; No; Patient NPO for procedure; uro  IV fluids: No IV fluids  GI: n/a  PT/OT: not ordered on admit    This plan and alternatives have been discussed with the  patient.    Code Status:  History    [facility]  Pager [phone]

## 2021-12-01 NOTE — ED Provider Notes (Signed)
Emergency Department Note  [facility] electronic medical record reviewed for pertinent medical history.     Patient: Stephen [name], MRN [id number], DOB January 06, 1951  The Date of Service for the Emergency Room encounter is 12/01/2021 12:27 PM     Nursing Triage Note :   Chief Complaint   Patient presents with   . Leg Pain     R leg pain x "a while" with recent worsening, unable to get comfortable at home and sleep. POC glucose 262. Denies recent trauma/injury to extremity, did have procedure recently.      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  Patient is a poor historian, however, patient reports decreased urinary output over the past couple of days where he has been only urinating a "small cup" each day.  He denies any dysuria, abdominal pain as well as hematuria.  He denies any low back pain.  He denies any fevers, rectal numbness, bowel incontinence.  States that right knee pain is chronic and has been ongoing for months.  He denies any recent injuries. Scheduled for ureteroscopy on 1/6.    Seen by ortho spine on 12/6 s/p interbody fusion of L2-L3 that went well on 11/21.     Recent lumbosacral radiograph on 12/6:  Signed by: Dwaine Deter 11/23/2021 16:16:18  IMPRESSION:  IMPRESSION:  No complications at recent revision spinal fusion, with combined interbody and posterior instrumented fixation from L2 through S1.    Past Medical History :   Past Medical History:   Diagnosis Date   . Chronic back pain    . Congenital hydronephrosis    . Gout    . Headache    . Hematuria    . Kidney disease    . Kidney stones    . Major depressive disorder, single episode    . Polyarthropathy or polyarthritis of multiple sites    . Retinal  detachment    . Urethral stricture      Reviewed    Past Surgical history :   Past Surgical History:   Procedure Laterality Date   . CT INSERTION OF SUPRAPUBIC CATH  09/25/2015   . NEPHRECTOMY Right 1995   . APPENDECTOMY     . COLONOSCOPY     . CYSTOSCOPY     . CYSTOSCOPY W/ LASER LITHOTRIPSY     . OTHER SURGICAL HISTORY      Interstim 01/29/2011   . SPINE SURGERY  09/21    Lumbar-sacral fusion   . TRANSURETHRAL RESECTION OF PROSTATE       Reviewed    Family History:   Family History   Adopted: Yes   Family history unknown: Yes     Reviewed    Social History:  Social History     Socioeconomic History   . Marital status: Single   Tobacco Use   . Smoking status: Never   . Smokeless tobacco: Never   Substance and Sexual Activity   . Alcohol use: No   . Drug use: Not Currently   . Sexual activity: Not Currently     Partners: Female   Other Topics Concern   . Military Service No   . Blood Transfusions Yes   . Caffeine Concern No   .  Occupational Exposure No   . Hobby Hazards No   . Sleep Concern Yes     Comment: due to my lower back discomfort and leg discomfort   . Stress Concern Yes     Comment: Health/ housing/ financial   . Weight Concern No   . Special Diet No   . Back Care Yes     Comment: Am very careful with any activity   . Exercises Regularly Yes   . Seat Belt Use Yes   . Performs Self-Exams Yes   . Bike Helmet Use No     Medications:   Prior to Admission Medications   Prescriptions Last Dose Informant Patient Reported? Taking?   ALAWAY 0.025 % ophthalmic solution   Yes No   Sig: INSTILL 1 DROP INTO BOTH EYES TWICE A DAY AS DIRECTED   DULoxetine (CYMBALTA) 20 MG CR capsule   No No   Sig: Take 1 capsule (20 mg) by mouth daily.   LORazepam (ATIVAN) 1 MG tablet   No No   Sig: Take 1 tablet (1 mg) by mouth every 6 hours as needed for Anxiety.   Polyethyl Glycol-Propyl Glycol (SYSTANE ULTRA OP)   Yes No   VITAMIN D PO   Yes No   Sig: Take 125 mg by mouth daily.   allopurinol (ZYLOPRIM) 300 MG tablet   Yes No   Sig:  Take 300 mg by mouth daily.   amLODIPINE (NORVASC) 5 MG tablet   Yes No   Sig: Take 10 mg by mouth daily.   ascorbic acid (VITAMIN C) 500 MG/5ML suspension   Yes No   docusate sodium (COLACE) 100 MG capsule   No No   Sig: Take 1 capsule (100 mg) by mouth 3 times daily for 5 days.   famotidine (PEPCID) 20 MG tablet   No No   Sig: Take 1 tablet (20 mg) by mouth every 12 hours.   lidocaine (ASPERCREME) 4 % patch   No No   Sig: Apply 1 patch topically every 24 hours. Leave patch on for 12 hours, then remove for 12 hours.   naloxone (NARCAN) 4 mg/0.1 mL nasal spray   No No   Sig: For suspected opioid overdose, call 911! Then spray once in one nostril. Repeat after 3 minutes if no or minimal response using a new spray in other nostril.   oxyCODONE (ROXICODONE) 5 MG immediate release tablet   No No   Sig: Take 1 tablet (5 mg) by mouth 2 times daily as needed for Moderate Pain (Pain Score 4-6) or Severe Pain (Pain Score 7-10).   pregabalin (LYRICA) 100 MG capsule   No No   Sig: TAKE 1 CAPSULE BY MOUTH EVERYDAY AT BEDTIME   senna (SENOKOT) 8.6 MG tablet   No No   Sig: Take 1 tablet (8.6 mg) by mouth daily.   triamcinolone (KENALOG) 0.1 % cream   Yes No   Sig: Apply 1 Application topically 2 times daily. Apply a thin layer as directed   zolpidem (AMBIEN) 5 MG tablet   No No   Sig: Take 1 tablet (5 mg) by mouth nightly as needed for Insomnia.      Facility-Administered Medications: None     Allergies: Amoxicillin and Sulfa drugs    ROS:   All other systems reviewed and negative unless otherwise noted in the HPI or above. This was done per my custom and practice for systems appropriate to the chief complaint in an emergency department setting  and varies depending on the quality of history that the patient is able to provide.      Physical Exam:   12/01/21  1242   BP: 115/55   Pulse: 89   Resp: 18   Temp: 99.6 F (37.6 C)   SpO2: 93%     Nursing note and vitals reviewed. Pt not hypoxic.  Gen: A&Ox4, patient is in NAD  HEENT:  NC/AT, PERRL  Neck: Supple, trachea midline with normal ROM  Cardiovascular: RRR no m/r/g  Pulmonary/Chest: CTAB, no increased WOB.  GI: Soft. NT/ND, PVR 390s  Back: NTTP   MSK: no edema, no tenderness, FROM and 5/5 strength of BLLEs  Neuro: CN II-XII grossly intact, normal speech, sensation intact, good coordination  Skin: Warm, dry, incision site of low back c/d/i  Psych: Appropriate mood and affect    ED Course & Clinical Decision Making:  70 year old male with PMHx as listed in HPI presents with urinary retention and chronic RLE pain. Both issues have been ongoing for some time. Vitals are overall unremarkable. On exam, patient's back is nontender to palpation. Incision site is clean, dry and intact.  Doubt cauda equina or acute spinal etiology of his pain.  He denies any preceding injury to the right knee, but will obtain radiographs although doubt fracture or dislocation. PVR obtained at bedside close to 400 cc. Abdomen is soft and nontender. He does have known history of unilateral left-sided kidney with ureteral carcinoma.  Suspect possible progression of his cancer.  He does have an a procedure with Urology in January.  Will obtain basic labs to assess renal function, renal ultrasound and urinalysis. Will place Foley here in the ED.    Initial workup as below. Will continue to reassess patient while in the ER and adjust workup as needed. Symptomatic control as below. Disposition pending workup.    Orders Placed This Encounter   Procedures   . X-Ray Knee 1 Or 2 Views - Right   . US Kidney Complete   . [name] Lower Extremity Venous Duplex Bilateral   . CMP   . CBC w/ Diff [place]   . Urinalysis with Culture Reflex, when indicated   . IP Consult to Urology     Medications   HYDROcodone-acetaminophen (NORCO) 5-325 MG tablet 1 tablet (1 tablet Oral Given 12/01/21 1350)     ED Course  ED Course as of 12/01/21 2207   Quenten Nawaz, [place] Documentation   Wed Dec 01, 2021   1354 Urology would like to be paged once labs  are back   1350 Urology paged   1257 PVR 396       Others' Documentation   Wed Dec 01, 2021   2206 P/w urinary retention, covid, + oxygen requirement. Admitted.  [KS]   2206 S/o JS: urinary retention, new O2 requirement with COVID, admitted for COVID hypoxia  [facility] paged for admission new 3L o2 requirement, desats to 87% on RA on my ambulation trial. Reports 3 days of cough. Vaccinated and boosted. Hx cancer, CXR pending. No smoking history, nonproductive cough [JS]   1906 Repeat vs sat 89-92% [JS]   1837 Satting 90 - 92% at rest, reports SOB. COVID+, cxr and ambulatory sat ordered. DC cancelled [JS]   1517 [name] Lower Extremity Venous Duplex Bilateral  IMPRESSION:  No evidence of deep venous thrombosis in the examined veins of the bilateral lower extremities. 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 PVF 400 [JS]      ED Course User Index  [[place]] Arefieva, Christa Arthor Captain, MD  [JS] Consuella Lose Norberta Keens, MD  [[place]] Heloise Ochoa Job Founds, MD     ED Clinical Impression    ICD-10-CM ICD-9-CM    1. Urinary retention  R33.9 788.20       2. Other chronic pain  G89.29 338.29       3. History of cancer of ureter  Z85.54 V10.59         I have discussed my thought process and plan for the patient with the attending physician, Sigmund Hazel, Clint Lipps, *.    This note was created using voice recognition technology.  Due to environmental circumstances and may contain errors.  The errors include but are not limited to grammatical errors, punctuation errors, spelling errors, etc.     Janeice Robinson, MD  Emergency Medicine Resident, PGY2  [facility]       Despina Hick,  MD  Resident  12/01/21 1428       Sigmund Hazel, Clint Lipps, MD  12/01/21 [phone]

## 2021-12-02 ENCOUNTER — Other Ambulatory Visit: Payer: Self-pay

## 2021-12-02 ENCOUNTER — Inpatient Hospital Stay (HOSPITAL_BASED_OUTPATIENT_CLINIC_OR_DEPARTMENT_OTHER): Payer: Medicare Other

## 2021-12-02 DIAGNOSIS — J189 Pneumonia, unspecified organism: Secondary | ICD-10-CM

## 2021-12-02 DIAGNOSIS — J1282 Pneumonia due to coronavirus disease 2019: Secondary | ICD-10-CM

## 2021-12-02 DIAGNOSIS — J9601 Acute respiratory failure with hypoxia: Secondary | ICD-10-CM

## 2021-12-02 DIAGNOSIS — J9811 Atelectasis: Secondary | ICD-10-CM

## 2021-12-02 DIAGNOSIS — U071 COVID-19: Principal | ICD-10-CM

## 2021-12-02 DIAGNOSIS — R918 Other nonspecific abnormal finding of lung field: Secondary | ICD-10-CM

## 2021-12-02 LAB — PROCALCITONIN, BLOOD
Procalcitonin: 4.3 ng/mL — ABNORMAL HIGH (ref 0.00–0.08)
Procalcitonin: 3.07 ng/mL — ABNORMAL HIGH (ref 0.00–0.08)

## 2021-12-02 LAB — CBC WITH DIFF, BLOOD
ANC-Automated: 4.9 10*3/uL (ref 1.6–7.0)
Abs Basophils: 0 10*3/uL (ref <0.2)
Abs Eosinophils: 0 10*3/uL (ref 0.0–0.5)
Abs Lymphs: 1.9 10*3/uL (ref 0.8–3.1)
Abs Monos: 0.7 10*3/uL (ref 0.2–0.8)
Basophils: 0 %
Eosinophils: 0 %
Hct: 31.4 % — ABNORMAL LOW (ref 40.0–50.0)
Hgb: 10.3 gm/dL — ABNORMAL LOW (ref 13.7–17.5)
Lymphocytes: 25 %
MCH: 29.9 pg (ref 26.0–32.0)
MCHC: 32.8 g/dL (ref 32.0–36.0)
MCV: 91.3 um3 (ref 79.0–95.0)
MPV: 9.7 fL (ref 9.4–12.4)
Monocytes: 9 %
Plt Count: 202 10*3/uL (ref 140–370)
RBC: 3.44 10*6/uL — ABNORMAL LOW (ref 4.60–6.10)
RDW: 13.4 % (ref 12.0–14.0)
Segs: 66 %
WBC: 7.5 10*3/uL (ref 4.0–10.0)

## 2021-12-02 LAB — COMPREHENSIVE METABOLIC PANEL, BLOOD
ALT (SGPT): 7 U/L (ref 0–41)
AST (SGOT): 14 U/L (ref 0–40)
Albumin: 3.5 g/dL (ref 3.5–5.2)
Alkaline Phos: 88 U/L (ref 40–129)
Anion Gap: 13 mmol/L (ref 7–15)
BUN: 22 mg/dL (ref 8–23)
Bicarbonate: 25 mmol/L (ref 22–29)
Bilirubin, Tot: 0.67 mg/dL (ref <1.2)
Calcium: 8.5 mg/dL (ref 8.5–10.6)
Chloride: 101 mmol/L (ref 98–107)
Creatinine: 1.17 mg/dL (ref 0.67–1.17)
Glucose: 139 mg/dL — ABNORMAL HIGH (ref 70–99)
Potassium: 3.5 mmol/L (ref 3.5–5.1)
Sodium: 139 mmol/L (ref 136–145)
Total Protein: 6.4 g/dL (ref 6.0–8.0)
eGFR Based on CKD-EPI 2021 Equation: >60 mL/min

## 2021-12-02 LAB — PRO BNP, BLOOD: BNPP: 166 pg/mL (ref 0–899)

## 2021-12-02 LAB — SED RATE, BLOOD: Sed Rate: 41 mm/hr — ABNORMAL HIGH (ref 0–20)

## 2021-12-02 LAB — D-DIMER HIGHLY SENSITIVE, BLOOD: D-Dimer HS: 1003 ng/mL D-DU — ABNORMAL HIGH (ref <241)

## 2021-12-02 LAB — TROPONIN T GEN 5: Troponin T Gen 5: 26 ng/L — ABNORMAL HIGH (ref <22)

## 2021-12-02 LAB — PHOSPHORUS, BLOOD: Phosphorous: 3.8 mg/dL (ref 2.7–4.5)

## 2021-12-02 LAB — C-REACTIVE PROTEIN, BLOOD: CRP: 18.94 mg/dL — ABNORMAL HIGH (ref <0.5)

## 2021-12-02 LAB — MAGNESIUM, BLOOD: Magnesium: 2 mg/dL (ref 1.6–2.4)

## 2021-12-02 MED ORDER — SODIUM CHLORIDE 0.9 % IV SOLN
1000.0000 mg | INTRAVENOUS | Status: DC
Start: 2021-12-02 — End: 2021-12-05
  Administered 2021-12-02 – 2021-12-05 (×5): 1000 mg via INTRAVENOUS
  Filled 2021-12-02 (×4): qty 1000

## 2021-12-02 MED ORDER — VANCOMYCIN 2 G IN 500 ML NS PREMIX IVPB
2000.0000 mg | Freq: Once | INTRAVENOUS | Status: AC
Start: 2021-12-02 — End: 2021-12-02
  Administered 2021-12-02 (×2): 2000 mg via INTRAVENOUS
  Filled 2021-12-02: qty 500

## 2021-12-02 MED ORDER — IOHEXOL 350 MG/ML IV SOLN
100.0000 mL | Freq: Once | INTRAVENOUS | Status: AC
Start: 2021-12-02 — End: 2021-12-02
  Administered 2021-12-02 (×2): 80 mL via INTRAVENOUS
  Filled 2021-12-02: qty 100

## 2021-12-02 MED ORDER — DEXAMETHASONE 2 MG OR TABS
6.0000 mg | ORAL_TABLET | Freq: Every day | ORAL | Status: DC
Start: 2021-12-02 — End: 2021-12-05
  Administered 2021-12-02 – 2021-12-05 (×5): 6 mg via ORAL
  Filled 2021-12-02 (×4): qty 3

## 2021-12-02 MED ORDER — VANCOMYCIN PER PHARMACY
INTRAVENOUS | Status: DC
Start: 2021-12-02 — End: 2021-12-02

## 2021-12-02 MED ORDER — BENZONATATE 100 MG OR CAPS
100.0000 mg | ORAL_CAPSULE | Freq: Three times a day (TID) | ORAL | Status: DC | PRN
Start: 2021-12-02 — End: 2021-12-06

## 2021-12-02 MED ORDER — AZITHROMYCIN 250 MG OR TABS
500.0000 mg | ORAL_TABLET | Freq: Every day | ORAL | Status: DC
Start: 2021-12-02 — End: 2021-12-05
  Administered 2021-12-02 – 2021-12-05 (×5): 500 mg via ORAL
  Filled 2021-12-02 (×4): qty 2

## 2021-12-02 MED ORDER — LACTATED RINGERS IV SOLN
Freq: Once | INTRAVENOUS | Status: AC
Start: 2021-12-02 — End: 2021-12-02

## 2021-12-02 MED ORDER — VANCOMYCIN HCL 750 MG IV SOLR
750.0000 mg | Freq: Two times a day (BID) | INTRAVENOUS | Status: DC
Start: 2021-12-02 — End: 2021-12-03
  Administered 2021-12-02 – 2021-12-03 (×3): 750 mg via INTRAVENOUS
  Filled 2021-12-02 (×2): qty 750

## 2021-12-02 NOTE — Interdisciplinary (Addendum)
12/02/21 1016   Initial Assessment   CM Initial Assessment * Completed   Readmission Risk Assessment   Readmission Within 30 Days of Discharge * Yes   Admission Was* Unplanned   Explanation* Re-admitted for acute medical condition unrelated to prior admission or preexisting chronic conditions   Did you see your doctor before coming back to hospital Yes   Did you have the help you needed at home to take care of yourself Yes   Were you able to get your medications upon discharge? Yes   2 Day Readmission Comments* 70 year old male with PMhx of solitary kideny, MDD, Urothelial [place] on recently completed chemotherapy, and recent XLIF & PSIL on 11/21 severe cord compression for  who presented for decreased urinary output     Thinks that the cough had been going on about 1wk. Worse over the past couple of days. Low energy. Denies fevers. Feeling constipated. Endorses nausea and poor appetite. Bothered by sore throat.hadn't noted any other changes. COVID +   Recent Hospitalizations (Within Last 6 Months) * Yes   High Risk For Readmission * No   Action Taken To Prevent Readmission After Discharge * Not Applicable   Patient Information   Where was the patient admitted from? * Home   Address on Facesheet correct?* Yes   Patient contact phone number on Facesheet correct? Yes   PCP listed on Facesheet correct? Yes   Prior to Level of Function * Ambulatory   Assistive Device * [place] * No   Additional Services on Admission Not Applicable   Available Assistance/Support System  and Prior Community Resources Family member(s)   Primary Caretaker(s) * Self   Primary Family/Caregiver Contact Name, Number and Relationship * Sister [name] [phone]   Permission to Contact * Yes   Social Worker Consult   Do you need to see a Education officer, museum? * No   Discharge Planning   Living Arrangements on Admission* Family Member   Type of Residence * One Story Home   Stairs/Steps to home  No   Patient's Discharge Goal(s) Home    Anticipated Discharge Disposition/Needs Home with Family   Anticipated DischargeTransportation *  Taxi   Barriers to Discharge * Clinical reason   Patient Engaged in Discharge Planning * Yes   Others Engaged in Discharge Plan: Name, Relationship and Phone Number na   Family/Caregiver's Assessed for * Not Applicable   Respite Care * Not Applicable   Patient/Family/Other Are In Agreement With Discharge Plan * To be determined   Vaccinated for COVID - 19 Yes   Public Health Clearance Needed * No   Medical Necessity:   70 year old male with PMhx of solitary kideny, MDD, Urothelial [place] on recently completed chemotherapy, and recent XLIF & PSIL on 11/21 severe cord compression for  who presented for decreased urinary output     Thinks that the cough had been going on about 1wk. Worse over the past couple of days. Low energy. Denies fevers. Feeling constipated. Endorses nausea and poor appetite. Bothered by sore throat.hadn't noted any other changes. COVID +    Interpreter Used: na    LACE+ Score: 70      PCP verified:  [name]  [address] / [place] [address]  telephone [phone]  fax [phone]    Pharmacy:  [id number] - [place] - [address]    PLOF: IND    SNF Hx & preference:  none  HH Hx & preference:  none  DME Hx & preference: can4     DISCHARGE PLANNING    Social Support: sister    Anticipated DC disposition: home with sister    Anticipated DC needs: transportation, CM to order a PCP f/u     Anticipated barriers to discharge: Covid, transportation Futures trader @ discharge: bus or taxi            Covid Vax status: x2         Jerolyn Shin, RN   Case Freight forwarder

## 2021-12-02 NOTE — Plan of Care (Signed)
Problem: Promotion of Health and Safety  Goal: Promotion of Health and Safety  Description: The patient remains safe, receives appropriate treatment and achieves optimal outcomes (physically, psychosocially, and spiritually) within the limitations of the disease process by discharge.    Information below is the current care plan.  Outcome: Progressing  Flowsheets  Taken 12/02/2021 0351  Guidelines: Inpatient Nursing Guidelines  Individualized Interventions/Recommendations #1: PRN med for Nausea  Individualized Interventions/Recommendations #2 (if applicable): Pain control  Individualized Interventions/Recommendations #3 (if applicable): Increased need for Oxygen. RT called to bedside, MD notifited and aware  Taken 12/02/2021 0000  Patient /Family stated Goal: to rest overnight  Note:

## 2021-12-02 NOTE — Progress Notes (Signed)
Vancomycin Initiation Note  Vancomycin Indication: Pneumonia (Goal: Trough 15 - 20 mg/L, AUC 400-600 mg-h/L)  70 year old male, Height: 5\' 6"  (167.6 cm), Weight: 90 kg (198 lb 6.6 oz), Kidney Function: SCr: 1.17 mg/dL.    Assessment / Plan:    Initial Pharmacokinetic Parameters: Volume of distribution: 52 L, Clearance: 2.8 L/h, Half-life: 13h   Give a loading dose of 2000mg . Will start 750mg  IV every 12 hours for an estimated steady state trough of 15.5 mg/L and AUC 540.69 mg-h/L.    Monitor kidney function and plan trough level on 12/17 or sooner if clinically indicated.    Pharmacist will continue to monitor and make adjustments as needed.    Talitha Givens, PHARMD

## 2021-12-02 NOTE — Consults (Cosign Needed)
ORTHOPAEDIC SPINE SURGERY CONSULT NOTE  12/02/21    Consulting Service: Vicenta Dunning, MD, IM    CC: Urinary retention s/p XLIF/PSIL L2-3 revision fusion 11/21 with Dr. 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     Patient states he is only able to urinate a few drops at time. Prior to this, the patient states that it has taken him a long time to urinate since his foley was removed following his most recent procedure. In ED, PVRs had been measured at 390 cc and 400 cc at different times. Patient evaluated by urology who placed foley 12/14. States he has been constipated since discharge. Last BM 5 days ago.      Patient denies any pain in legs, pain well controlled in back, some incisional back pain when he lays on it. Endorses fevers/chills for the past few days. Able to ambulate since surgery without issues. Denies saddle anesthesia. Patient states that he has also had cough and difficulty breathing recently, tested positive for COVID 12/14    Review of Systems:  Constitutional: See HPI  Eyes: negative.  Ears, Nose, Mouth, Throat: negative.  CV: negative.  Resp: See HPI  GI: negative.  GU: negative.  Musculoskeletal: see HPI.  Integumentary: negative.  Neuro: negative.  Psych: negative.  Endo: negative.  Heme/Lymphatic: negative.    PMH:  Past Medical History:   Diagnosis Date    Chronic back pain     Congenital hydronephrosis     Gout     Headache     Hematuria     Kidney disease     Kidney stones     Major depressive disorder, single episode     Polyarthropathy or polyarthritis of multiple sites     Retinal detachment     Urethral stricture         PSH:  Past Surgical History:   Procedure Laterality Date    CT INSERTION OF SUPRAPUBIC CATH  09/25/2015    NEPHRECTOMY Right 1995    APPENDECTOMY      COLONOSCOPY      CYSTOSCOPY       CYSTOSCOPY W/ LASER LITHOTRIPSY      OTHER SURGICAL HISTORY      Interstim 01/29/2011    SPINE SURGERY  09/21    Lumbar-sacral fusion    TRANSURETHRAL RESECTION OF PROSTATE          Medications:  Current Facility-Administered Medications   Medication    allopurinol (ZYLOPRIM) tablet 300 mg    [Manual MAR Hold] amLODIPINE (NORVASC) tablet 10 mg    azithromycin (ZITHROMAX) tablet 500 mg    cefTRIAXone (ROCEPHIN) 1,000 mg in sodium chloride 0.9 % 50 mL IVPB    dexAMETHasone (DECADRON) tablet 6 mg    docusate sodium (COLACE) capsule 100 mg    DULoxetine (CYMBALTA) CR capsule 20 mg    heparin injection 5,000 Units    ipratropium-albuterol (DUO-NEB) 0.5-3 MG/3ML nebulizer solution 3 mL    ondansetron (ZOFRAN ODT) disintegrating tablet 8 mg    oxyCODONE (ROXICODONE) tablet 5 mg    pregabalin (LYRICA) capsule 100 mg    remdesivir (VEKLURY) 100 mg in sodium chloride 0.9 % 250 mL IVPB    sodium chloride 0.9 % flush 3 mL    sodium chloride 0.9 % flush 3 mL  sodium chloride 0.9 % TKO infusion    vancomycin (VANCOCIN) 750 mg in sodium chloride 0.9 % 250 mL IVPB     Current Outpatient Medications   Medication Sig    ALAWAY 0.025 % ophthalmic solution INSTILL 1 DROP INTO BOTH EYES TWICE A DAY AS DIRECTED    albuterol 108 (90 Base) MCG/ACT inhaler Inhale 2 puffs by mouth every 4 hours as needed for Wheezing.    allopurinol (ZYLOPRIM) 300 MG tablet Take 300 mg by mouth daily.    amLODIPINE (NORVASC) 5 MG tablet Take 10 mg by mouth daily.    ascorbic acid (VITAMIN C) 500 MG/5ML suspension     docusate sodium (COLACE) 100 MG capsule Take 1 capsule (100 mg) by mouth 3 times daily for 5 days.    DULoxetine (CYMBALTA) 20 MG CR capsule Take 1 capsule (20 mg) by mouth daily.    naloxone (NARCAN) 4 mg/0.1 mL nasal spray For suspected opioid overdose, call 911! Then spray once in one nostril. Repeat after 3 minutes if no or minimal response using a new spray in other nostril.    oxyCODONE (ROXICODONE) 5 MG  immediate release tablet Take 1 tablet (5 mg) by mouth 2 times daily as needed for Moderate Pain (Pain Score 4-6) or Severe Pain (Pain Score 7-10).    Polyethyl Glycol-Propyl Glycol (SYSTANE ULTRA OP)     pregabalin (LYRICA) 100 MG capsule TAKE 1 CAPSULE BY MOUTH EVERYDAY AT BEDTIME    senna (SENOKOT) 8.6 MG tablet Take 1 tablet (8.6 mg) by mouth daily.    triamcinolone (KENALOG) 0.1 % cream Apply 1 Application topically 2 times daily. Apply a thin layer as directed    VITAMIN D PO Take 125 mg by mouth daily.        Allergies:  Allergies   Allergen Reactions    Amoxicillin Rash    Sulfa Drugs Unspecified       Social History:  Occupation: Retired Marine scientist  Alcohol: Denies  Tobacco: Denies   Illicits: Denies     Family History:   Non-contributory     Physical Exam:  BP 104/62    Pulse 51    Temp 97.8 F (36.6 C)    Resp 20    Ht _0  (1.676 m)    Wt 90 kg (198 lb 6.6 oz)    SpO2 98%    BMI 32.02 kg/m     General: patient awake, alert, and responding to commands; no apparent distress  HEENT: hearing and vision grossly intact  Cardio: regular rate and rhythm per peripheral pulses  Respiratory: patient breathing quietly without use of accessory muscles    Musculoskeletal:    Spine: Full painless neck ROM. No midline tenderness or step-off deformities of C/T/L spine    General: 70 year old male in no acute distress.   Vitals: There were no vitals filed for this visit.  Motor:                                       Right                Left         Iliopsoas                       5/5  5/5         Quads                5/5                   5/5   Tib Ant                          5/5                   5/5   EHL                   5/5                   5/5   Gastroc                        5/5                   5/5    Sensory:   Bilaterally, intact to light touch from L1-S1    Reflexes:   Bilaterally, no hoffmans or clonus    Skin:   Incision is clean, dry and intact without signs of erythema  or drainage. Steri strips in place    DRE: good rectal tone, no blood. Normal sensation.     Vascular:   2+ DP/PT    Labs:  Lab Results   Component Value Date    NA 139 12/02/2021    K 3.5 12/02/2021    CL 101 12/02/2021    BICARB 25 12/02/2021    BUN 22 12/02/2021    CREAT 1.17 12/02/2021    GLU 139 (H) 12/02/2021    [place] 8.5 12/02/2021     Lab Results   Component Value Date    WBC 7.5 12/02/2021    HGB 10.3 (L) 12/02/2021    HCT 31.4 (L) 12/02/2021    PLT 202 12/02/2021     No results found for: INR, PTT  Lab Results   Component Value Date/Time    CRP 18.94 (H) 12/02/2021 03:03 AM     Lab Results   Component Value Date/Time    ESR 41 (H) 12/02/2021 03:03 AM       Imaging:    MRI L spine 12/03/2021    IMPRESSION:  1. The last well-formed disc is labeled L5-S1, similar to prior lumbar spine MRI.    2. Since the prior lumbar spine MRI, interval partial L2 and complete L3 laminectomies as well as L2-3 interbody fusion. Interval revision posterior fixation, now from L2-S1. There are additional postsurgical changes related to past L4 laminectomy, L3-4 and L4-5 interbody fusions, and L5-S1 anterior interbody fusion. There is a postsurgical 9.8 x 4.1 cm fluid collection extending from L2-L5 with mild rim enhancement.    3. Retrolisthesis of L2 on L3 has decreased from 5 to 3 mm while grade 2 anterolisthesis of L5 on S1 remains stable at 10 mm.    4. L2-3 spinal canal narrowing has substantially improved with mild residual overall narrowing. However, the previously seen right paracentral-subarticular inferiorly-directed disc extrusion has enlarged from 15 x 6 to 17 x 7 mm (CC x AP; Se 2, Im 8), associated with complete right lateral recess narrowing. Edema within the extruded component compatible with recent progression. Mild left and mild-moderate right foraminal narrowing, improved on both sides from prior lumbar spine MRI.  5. L3-4 spinal canal narrowing has improved with no residual. Mild left foraminal  narrowing, slightly improved. Mild-moderate right foraminal narrowing, slightly improved.    6. No significant spinal canal narrowing in the remaining visualized thoracic and lumbar levels. Persistent moderate-severe left and moderate right L4-5 and severe bilateral L5-S1 foraminal narrowing.    7. There is marrow edema in the L2 vertebral body and L3 superior endplate, likely related to stress reaction and degenerative changes. Infectious process felt to be less likely. No definite abnormal intrathecal enhancement, although evaluation is limited by fixation and motion artifacts.      CT L spine 12/03/2021    IMPRESSION:  Post revision posterior instrumented fusion and interbody fusion of L2-S1 with expected postsurgical changes in the posterior paraspinous soft tissues, better evaluated on contrast-enhanced MRI performed on same date.    No acute fracture or subluxation. No evidence of hardware complication.    No significant spinal canal narrowing. Multilevel neural foraminal narrowing, moderate to severe at L4-L5 and L5-S1; better evaluated on MRI.      X-Ray Knee 1 Or 2 Views - Right    Result Date: 12/01/2021  IMPRESSION: Minimal tricompartmental degeneration of the knee joint.    [name] Lower Extremity Venous Duplex Bilateral    Result Date: 12/01/2021  IMPRESSION: No evidence of deep venous thrombosis in the examined veins of the bilateral lower extremities.     CTA Pulmonary Embolus    Result Date: 12/02/2021  IMPRESSION: 1. No evidence of pulmonary embolism. 2. Multifocal pneumonia. 3. Ectatic ascending aorta. 4. Mild amount of coronary artery calcifications. 5. Few additional findings above.     X-Ray Chest Single View    Result Date: 12/02/2021  IMPRESSION: Increased bibasal opacities of atelectasis/pneumonia with possible right pleural effusion.     X-Ray Chest Single View    Result Date: 12/01/2021  IMPRESSION: Findings compatible with pneumonia.    X-Ray Chest Single View    Result Date:  11/04/2021  IMPRESSION: No acute findings.    X-Ray Thoracolumbar Spine 2 Views    Result Date: 11/10/2021  IMPRESSION: No acute fracture of the imaged thoracolumbar spine. Recent revision spinal fusion from L2 to S1 is incompletely imaged on these radiographs and is better assessed on previous day lumbosacral spine radiographs.    X-Ray Lumbosacral Spine 2 Or 3 Views    Result Date: 11/23/2021  IMPRESSION: No complications at recent revision spinal fusion, with combined interbody and posterior instrumented fixation from L2 through S1.    X-Ray Lumbosacral Spine 2 Or 3 Views    Result Date: 11/09/2021  IMPRESSION: Satisfactory appearance status post extension of spinal fixation of to L2.    US Kidney Complete    Result Date: 12/01/2021  IMPRESSION: Persistent stable mild-to-moderate hydronephrosis, which is overall similar compared to MR 10/25/2021. No obstructing stones visualized.     I personally reviewed other pertinent MSK imaging and failed to identify other acute traumatic MSK pathology.      Assessment:  Stephen [name] is a 70 year old male who presents with acute on chronic urinary retention in setting of L2-3 XLIF performed approximately 4 weeks ago. Neuro exam in lower extremities and DRE reassuring for no acute cord compression or cauda equina. No concern for cord compression etiology of urinary retention. Recommend continued evaluation by urology for obstructive etiologies. Plan for follow up in Orthopedic Spine clinic in 2 weeks.     Plan/Recs:  - Treatment Plan: Continue post-op course as planned  -  Activity: WBAT BLE  - Immobilization: None  - VTE ppx: Per primary team    - Antibiotics: Per primary team  - Pain control: multimodal pain control with PO meds, minimize IV   - Imaging: no further imaging workup required at this time.  - Diet: Per primary team    Dispo/Follow up plan: Plan for follow up in Ortho Spine clinic in 2 weeks.    Case discussed with Spine Fellow, Dr. Mamie Nick. Attending of  record is Dr. Esperanza Sheets MD  LT, [facility], [place]  PGY-2 Orthopedic Surgery      For non-urgent communications, direct messaging via Epic/Haiku is preferred     Please Note: Plan and Recs are not final until this note is signed

## 2021-12-02 NOTE — Interdisciplinary (Signed)
Pg'd 1st call MD following Msg:     Re: ED18 Stephen [name]. Hello Pt. was on 2L NC increased to 7L NC due to desats. Pls place Oxygen protocol order. Thank you

## 2021-12-02 NOTE — Progress Notes (Signed)
Stephen [name] Hospital day:   1 day - Admitted on: 12/01/2021    Stephen [name] is a 70 year old male with pertinent history of of solitary L kidney, urothelial cancer recently completed chemo, MDD, transurethral resection of the prostate, recent XLIF L2-L3 and revision T10-pelvis on 11/21, who presents for acutely decreased urinary output, and AHRF c/f superimposed PNA on COVID.     24 Hour - Interval Events   Admitted    Subjective   Feeling OK overall. 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 Endorsed some shortness of breath but states it feels better than when he came in. No fevers/chills.     Physical Exam   Temp  Min: 97.6 F (36.4 C)  Max: 98.5 F (36.9 C)  Pulse  Min: 51  Max: 82  BP  Min: 86/49  Max: 139/72  Resp  Min: 16  Max: 24  SpO2  Min: 87 %  Max: 99 %    BP 104/62   Pulse 51   Temp 97.8 F (36.6 C)   Resp 20   Ht 5\' 6"  (1.676 m)   Wt 90 kg (198 lb 6.6 oz)   SpO2 98%   BMI 32.02 kg/m  O2 Device: Nasal cannula O2 Flow Rate (L/min): 7 l/min    12/14 0600 - 12/15 0559  In: 360 [P.O.:360]  Out: 600 [Urine:600]  Urine x 0 Stool x 0 Emesis x 0       GEN: Awake, alert, in NAD  HEENT: PERRL, EOMI, normal conjunctivae  CV: RRR, no murmurs  Resp: Faint wheezes, crackles b/l  GI: Soft, nontender, nondistended  MSK: Mild tenderness to palpation over incision site at lumbar spine, which is c/d/i. No surrounding erythema or warmth. Full strength and ROM in all four extremities.  Neuro: AOx4. No focal neuro deficits.     Pertinent Labs and Imaging and Meds       WBC 7.5 (12/15) HGB 10.3* (12/15) PLT 202 (12/15)    HCT 31.4* (12/15)      %Neutrophils 66 (12/15) %Bands   %Lymphs 25 (12/15) %Monos 9 (12/15) %Eos 0 (12/15) %Basos 0 (12/15)  Na 139 (12/15) CL 101 (12/15) BUN 22 (12/15) GLU   139* (12/15)   K 3.5 (12/15) CO2 25 (12/15) Cr 1.17 (12/15)      Stephen [name] 8.5 (12/15) Mg 2.0 (12/15) Phos 3.8 (12/15)      Scheduled Medications  . allopurinol   300 mg Daily   . [Manual MAR Hold] amLODIPINE  10 mg Daily   . azithromycin  500 mg Daily   . dexAMETHasone  6 mg Daily   . docusate sodium  100 mg TID   . DULoxetine  20 mg Daily   . heparin  5,000 Units Q8H   . pregabalin  100 mg HS   . remdesivir (VEKLURY) IVPB  100 mg Q24H NR   . sodium chloride  3 mL Q8H     Continuous Medications  . cefTRIAXone (ROCEPHIN) IV 1,000 mg (12/02/21 2836)   . sodium chloride     . vancomycin (VANCOCIN) IVPB       PRN Medications  . ipratropium-albuterol  3 mL Q6H PRN   . ondansetron  8 mg Q8H PRN   . oxyCODONE  5 mg Q6H PRN   . sodium chloride  3 mL PRN   . sodium chloride   Continuous PRN  CXR 12/15  IMPRESSION:   Findings compatible with pneumonia.    CXR 12/15  IMPRESSION:   Increased bibasal opacities of atelectasis/pneumonia with possible right pleural effusion.     Renal U/S 12/14  IMPRESSION:   Persistent stable mild-to-moderate hydronephrosis, which is overall similar compared to MR 10/25/2021. No obstructing stones visualized.     Assessment / Plan   Stephen [name] is a 85 M with PMHx of solitary L kidney, urothelial cancer recently completed chemo, MDD, transurethral resection of the prostate, recent XLIF L2-L3 and revision T10-pelvis on 11/21, who presents for acutely decreased urinary output, and AHRF c/f superimposed PNA on COVID.     # AHRF  # COVID   # C/f superimposed bacterial PNA   Presented with cough and dyspnea, noted to be hypoxemic and placed on 2LNC. Found to be COVID positive; patient had 2 COVID vaccines a couple years ago, no booster vaccines since. CXR with opacities suggestive of right atelectasis from mucous plugging, and possible PNA. Patient had acutely increased O2 requirements overnight to 6L; repeat CXR without significant change. Given acute hypoxemia will obtain CTA PE, start on IV abx for PNA coverage, and start COVID treatment.   - F/u CTA PE  - Trend procal, f/u RPNA, MRSA nares, sputum culture, bcx   - Start Vanc/CTX/azithro (12/15 -  )  - Remdes (12/15 - )  - Dex (12/15 - )  - Secretion clearance protocol   - Wean O2 as able    # Urinary retention  Found to be retaining 300-400cc urine, foley placed. Patient is a poor historian but reports urinary retention began sometime after surgery in November. Urology was consulted in ED and advised that there was no indication for acute urologic intervention. Patient is w/o saddle anesthesia, lower ext numbness/tingling, or bowel incontinence. Has localized tenderness to incision site which is c/d/i.   - Continue foley  - Urology consulted, appreciate recs   - Will arrange outpatient f/u if still with foley on D/C  - Consult ortho, recs appreciated    - Ordered MRI L spine w&wo con    # L urothelial cancer   - FYI heme/onc in AM    # HTN  - Cont home amlodipine     # Gout  - Cont home allopurinol    # MDD  - Cont home duloxetine  - Cont home pregabalin       Disposition Planning 12/02/2021:  Remaining discharge needs: clinical improvement and consultant input (ortho, uro)   Discharge location: To be determined  Outpatient Follow-up Needed: TBD  Estimated Discharge: >72 hours    FOLEY   VTE prophylaxis: SQ heparin  Diet: Diet Regular  Last BM:    IV fluids: No IV fluids  Access: PIVs  Code status: Full Code      Patient care was discussed in detail with Stephen Dunning, MD.    Stephen [name]  Internal Medicine, PGY-1

## 2021-12-03 ENCOUNTER — Inpatient Hospital Stay (HOSPITAL_BASED_OUTPATIENT_CLINIC_OR_DEPARTMENT_OTHER): Payer: Medicare Other

## 2021-12-03 ENCOUNTER — Other Ambulatory Visit (HOSPITAL_BASED_OUTPATIENT_CLINIC_OR_DEPARTMENT_OTHER): Payer: Self-pay

## 2021-12-03 ENCOUNTER — Telehealth (HOSPITAL_BASED_OUTPATIENT_CLINIC_OR_DEPARTMENT_OTHER): Payer: Self-pay | Admitting: Orthopaedic Surgery of the Spine

## 2021-12-03 DIAGNOSIS — M48061 Spinal stenosis, lumbar region without neurogenic claudication: Secondary | ICD-10-CM

## 2021-12-03 DIAGNOSIS — M4726 Other spondylosis with radiculopathy, lumbar region: Secondary | ICD-10-CM

## 2021-12-03 DIAGNOSIS — M4807 Spinal stenosis, lumbosacral region: Secondary | ICD-10-CM

## 2021-12-03 DIAGNOSIS — M4317 Spondylolisthesis, lumbosacral region: Secondary | ICD-10-CM

## 2021-12-03 DIAGNOSIS — M4316 Spondylolisthesis, lumbar region: Secondary | ICD-10-CM

## 2021-12-03 DIAGNOSIS — Z981 Arthrodesis status: Secondary | ICD-10-CM

## 2021-12-03 DIAGNOSIS — M5126 Other intervertebral disc displacement, lumbar region: Secondary | ICD-10-CM

## 2021-12-03 LAB — ECG 12-LEAD
ATRIAL RATE: 51 {beats}/min
P AXIS: 67 degrees
PR INTERVAL: 188 ms
QRS INTERVAL/DURATION: 102 ms
QT: 426 ms
QTc (Bazett): 392 ms
R AXIS: 47 degrees
T AXIS: 6 degrees
VENTRICULAR RATE: 51 {beats}/min

## 2021-12-03 LAB — CBC WITH DIFF, BLOOD
ANC-Automated: 4 10*3/uL (ref 1.6–7.0)
Abs Basophils: 0 10*3/uL (ref <0.2)
Abs Eosinophils: 0 10*3/uL (ref 0.0–0.5)
Abs Lymphs: 1.3 10*3/uL (ref 0.8–3.1)
Abs Monos: 0.4 10*3/uL (ref 0.2–0.8)
Basophils: 0 %
Eosinophils: 0 %
Hct: 29.2 % — ABNORMAL LOW (ref 40.0–50.0)
Hgb: 9.9 gm/dL — ABNORMAL LOW (ref 13.7–17.5)
Lymphocytes: 23 %
MCH: 30.7 pg (ref 26.0–32.0)
MCHC: 33.9 g/dL (ref 32.0–36.0)
MCV: 90.4 um3 (ref 79.0–95.0)
MPV: 9.8 fL (ref 9.4–12.4)
Monocytes: 6 %
Plt Count: 192 10*3/uL (ref 140–370)
RBC: 3.23 10*6/uL — ABNORMAL LOW (ref 4.60–6.10)
RDW: 13.3 % (ref 12.0–14.0)
Segs: 70 %
WBC: 5.7 10*3/uL (ref 4.0–10.0)

## 2021-12-03 LAB — BASIC METABOLIC PANEL, BLOOD
Anion Gap: 11 mmol/L (ref 7–15)
BUN: 25 mg/dL — ABNORMAL HIGH (ref 8–23)
Bicarbonate: 25 mmol/L (ref 22–29)
Calcium: 8.8 mg/dL (ref 8.5–10.6)
Chloride: 105 mmol/L (ref 98–107)
Creatinine: 1.05 mg/dL (ref 0.67–1.17)
Glucose: 168 mg/dL — ABNORMAL HIGH (ref 70–99)
Potassium: 4.1 mmol/L (ref 3.5–5.1)
Sodium: 141 mmol/L (ref 136–145)
eGFR Based on CKD-EPI 2021 Equation: >60 mL/min

## 2021-12-03 LAB — MAGNESIUM, BLOOD: Magnesium: 2 mg/dL (ref 1.6–2.4)

## 2021-12-03 LAB — PHOSPHORUS, BLOOD: Phosphorous: 3.9 mg/dL (ref 2.7–4.5)

## 2021-12-03 LAB — MRSA SURVEILLANCE CULTURE

## 2021-12-03 LAB — PROCALCITONIN, BLOOD: Procalcitonin: 2.5 ng/mL — ABNORMAL HIGH (ref 0.00–0.08)

## 2021-12-03 MED ORDER — LORAZEPAM 2 MG/ML IJ SOLN
1.0000 mg | Freq: Once | INTRAMUSCULAR | Status: AC | PRN
Start: 2021-12-03 — End: 2021-12-03
  Administered 2021-12-03 (×2): 1 mg via INTRAVENOUS
  Filled 2021-12-03: qty 1

## 2021-12-03 MED ORDER — MENTHOL 3 MG MT LOZG
1.0000 | LOZENGE | OROMUCOSAL | Status: DC | PRN
Start: 2021-12-03 — End: 2021-12-06
  Administered 2021-12-04 (×2): 1 via BUCCAL
  Filled 2021-12-03: qty 1

## 2021-12-03 MED ORDER — GADOBUTROL 1 MMOL/ML IV SOLN (WRAPPED RECORD)
INTRAVENOUS | Status: AC
Start: 2021-12-03 — End: ?
  Filled 2021-12-03: qty 10

## 2021-12-03 MED ORDER — LORAZEPAM 0.5 MG OR TABS
1.0000 mg | ORAL_TABLET | Freq: Once | ORAL | Status: DC
Start: 2021-12-03 — End: 2021-12-03

## 2021-12-03 MED ORDER — GADOBUTROL 1 MMOL/ML IV SOLN (WRAPPED RECORD)
10.0000 mL | Freq: Once | INTRAVENOUS | Status: AC
Start: 2021-12-03 — End: 2021-12-03
  Administered 2021-12-03 (×2): 10 mL via INTRAVENOUS
  Filled 2021-12-03: qty 10

## 2021-12-03 MED ORDER — NALOXONE HCL 0.4 MG/ML IJ SOLN
0.1000 mg | INTRAMUSCULAR | Status: DC | PRN
Start: 2021-12-03 — End: 2021-12-06

## 2021-12-03 MED ORDER — GUAIFENESIN-DM 100-10 MG/5ML OR SYRP
10.0000 mL | ORAL_SOLUTION | ORAL | Status: DC | PRN
Start: 2021-12-03 — End: 2021-12-06
  Administered 2021-12-03 – 2021-12-05 (×4): 10 mL via ORAL
  Filled 2021-12-03 (×4): qty 10

## 2021-12-03 MED ORDER — LORAZEPAM 0.5 MG OR TABS
1.0000 mg | ORAL_TABLET | Freq: Once | ORAL | Status: DC | PRN
Start: 2021-12-03 — End: 2021-12-03

## 2021-12-03 NOTE — Telephone Encounter (Signed)
Janett Billow calling from [employer] . States that a [facility] certification addendum was Faxed over on  12/01/21 and 12/02/21 To the number [phone]. Requesting that the 5 part form be signed and faxed back to [phone] as soon as possible    Please assist        CB [phone]

## 2021-12-03 NOTE — Progress Notes (Signed)
[facility] day:   2 days - Admitted on: 12/01/2021    Stephen [name] is a 70 year old male with pertinent history of of solitary L kidney, urothelial cancer recently completed chemo, MDD, transurethral resection of the prostate, recent XLIF L2-L3 and revision T10-pelvis on 11/21, who presents for acutely decreased urinary output, and AHRF c/f superimposed PNA on COVID.     24 Hour - Interval Events   NAOE    Subjective   Doing fine. Breathing and cough improving. States he does not think the urinary retention is related to his back surgery, however is willing to do the MRI. States will need something to help with his claustrophobia.     Physical Exam   Temp  Min: 97.6 F (36.4 C)  Max: 98 F (36.7 C)  Pulse  Min: 44  Max: 81  BP  Min: 86/49  Max: 113/49  Resp  Min: 14  Max: 22  SpO2  Min: 94 %  Max: 100 %    BP 109/62 (BP Location: Right arm, BP Patient Position: Semi-Fowlers)   Pulse (!) 44   Temp 97.8 F (36.6 C)   Resp 15   Ht 5\' 6"  (1.676 m)   Wt 90 kg (198 lb 6.6 oz)   SpO2 98%   BMI 32.02 kg/m  O2 Device: Nasal cannula O2 Flow Rate (L/min): 4 l/min    12/15 0600 - 12/16 0559  In: 2992 [P.O.:840; I.V.:803]  Out: 1700 [Urine:1700]  Urine x 0 Stool x 1 Emesis x 0       GEN: Awake, alert, in NAD  HEENT: PERRL, EOMI, normal conjunctivae  CV: RRR, no murmurs  Resp: Faint wheezes, crackles b/l  GI: Soft, nontender, nondistended  MSK: Mild tenderness to palpation over incision site at lumbar spine, which is c/d/i. No surrounding erythema or warmth. Full strength and ROM in all four extremities.  Neuro: AOx4. No focal neuro deficits.     Pertinent Labs and Imaging and Meds       WBC 5.7 (12/16) HGB 9.9* (12/16) PLT 192 (12/16)    HCT 29.2* (12/16)      %Neutrophils 70 (12/16) %Bands   %Lymphs 23 (12/16) %Monos 6 (12/16) %Eos 0 (12/16) %Basos 0 (12/16)  Na 141 (12/16) CL 105 (12/16) BUN 25* (12/16) GLU   168* (12/16)   K 4.1 (12/16) CO2 25 (12/16) Cr 1.05 (12/16)      [place] 8.8  (12/16) Mg 2.0 (12/16) Phos 3.9 (12/16)      Scheduled Medications  . allopurinol  300 mg Daily   . [Manual MAR Hold] amLODIPINE  10 mg Daily   . azithromycin  500 mg Daily   . dexAMETHasone  6 mg Daily   . docusate sodium  100 mg TID   . DULoxetine  20 mg Daily   . heparin  5,000 Units Q8H   . pregabalin  100 mg HS   . remdesivir (VEKLURY) IVPB  100 mg Q24H NR   . sodium chloride  3 mL Q8H     Continuous Medications  . cefTRIAXone (ROCEPHIN) IV 1,000 mg (12/03/21 4268)   . sodium chloride     . vancomycin (VANCOCIN) IVPB 750 mg (12/02/21 2101)     PRN Medications  . benzonatate  100 mg Q8H PRN   . ipratropium-albuterol  3 mL Q6H PRN   . ondansetron  8 mg Q8H PRN   . oxyCODONE  5 mg  Q6H PRN   . sodium chloride  3 mL PRN   . sodium chloride   Continuous PRN        CXR 12/15  IMPRESSION:   Findings compatible with pneumonia.    CXR 12/15  IMPRESSION:   Increased bibasal opacities of atelectasis/pneumonia with possible right pleural effusion.     Renal U/S 12/14  IMPRESSION:   Persistent stable mild-to-moderate hydronephrosis, which is overall similar compared to MR 10/25/2021. No obstructing stones visualized.     Assessment / Plan   Stephen [name] is a 89 M with PMHx of solitary L kidney, urothelial cancer recently completed chemo, MDD, transurethral resection of the prostate, recent XLIF L2-L3 and revision T10-pelvis on 11/21, who presents for acutely decreased urinary output, and AHRF c/f superimposed PNA on COVID.     # AHRF  # COVID   # C/f superimposed bacterial PNA   Presented with cough and dyspnea, noted to be hypoxemic and placed on 2LNC. Found to be COVID positive; patient had 2 COVID vaccines a couple years ago, no booster vaccines since. CXR with opacities suggestive of right atelectasis from mucous plugging, and possible PNA. Patient had acutely increased O2 requirements overnight to 6L; repeat CXR without significant change. Given acute hypoxemia will obtain CTA PE, start on IV abx for PNA coverage,  and start COVID treatment.   - Trend procal, f/u RPNA, MRSA nares, sputum culture, bcx   - Cont vanc/CTX/azithro (12/15 - ); will peel back vanc pending MRSA   - Remdes (12/15 - )  - Dex (12/15 - )  - Secretion clearance protocol   - Wean O2 as able    # Urinary retention  Found to be retaining 300-400cc urine, foley placed. Patient is a poor historian but reports urinary retention began sometime after surgery in November. Urology was consulted in ED and advised that there was no indication for acute urologic intervention. Patient is w/o saddle anesthesia, lower ext numbness/tingling, or bowel incontinence. Has localized tenderness to incision site which is c/d/i.   - Continue foley  - Urology consulted, appreciate recs   - Will arrange outpatient f/u if still with foley on D/C  - Consult ortho, recs appreciated    - Ordered MRI L spine w&wo con    # L urothelial cancer   - FYI heme/onc in AM    # HTN  - Cont home amlodipine     # Gout  - Cont home allopurinol    # MDD  - Cont home duloxetine  - Cont home pregabalin       Disposition Planning 12/03/2021:  Remaining discharge needs: clinical improvement and consultant input (ortho, uro)   Discharge location: To be determined  Outpatient Follow-up Needed: TBD  Estimated Discharge: >72 hours    FOLEY   VTE prophylaxis: SQ heparin  Diet: Diet Regular  Last BM: 12/03/21 0000   IV fluids: No IV fluids  Access: PIVs  Code status: Full Code      Patient care was discussed in detail with Vicenta Dunning, MD.    Meade Maw  Internal Medicine, PGY-1

## 2021-12-03 NOTE — Telephone Encounter (Signed)
Routed to forms

## 2021-12-03 NOTE — Plan of Care (Signed)
Problem: Promotion of Health and Safety  Goal: Promotion of Health and Safety  Description: The patient remains safe, receives appropriate treatment and achieves optimal outcomes (physically, psychosocially, and spiritually) within the limitations of the disease process by discharge.    Information below is the current care plan.  Outcome: Progressing  Flowsheets  Taken 12/03/2021 3582 by Princella Ion, RN  Outcome Evaluation (rationale for progressing/not progressing) every shift: Pt given PO zofran for nausea. Currently denies pain. Oxygen needs stable, no increase of demand.  Taken 12/02/2021 0845 by Sharyon Medicus, RN  Patient /Family stated Goal: Go home when I'm better  Taken 12/02/2021 0351 by Judye Bos, RN  Guidelines: Inpatient Nursing Guidelines  Individualized Interventions/Recommendations #1: PRN med for Nausea  Individualized Interventions/Recommendations #2 (if applicable): Pain control  Individualized Interventions/Recommendations #3 (if applicable): Increased need for Oxygen. RT called to bedside, MD notifited and aware  Note:

## 2021-12-04 ENCOUNTER — Other Ambulatory Visit: Payer: Self-pay

## 2021-12-04 LAB — CBC WITH DIFF, BLOOD
ANC-Automated: 5.4 10*3/uL (ref 1.6–7.0)
Abs Basophils: 0 10*3/uL (ref <0.2)
Abs Eosinophils: 0 10*3/uL (ref 0.0–0.5)
Abs Lymphs: 1.7 10*3/uL (ref 0.8–3.1)
Abs Monos: 0.4 10*3/uL (ref 0.2–0.8)
Basophils: 0 %
Eosinophils: 0 %
Hct: 31.9 % — ABNORMAL LOW (ref 40.0–50.0)
Hgb: 10.7 gm/dL — ABNORMAL LOW (ref 13.7–17.5)
Lymphocytes: 23 %
MCH: 30.7 pg (ref 26.0–32.0)
MCHC: 33.5 g/dL (ref 32.0–36.0)
MCV: 91.4 um3 (ref 79.0–95.0)
MPV: 10.3 fL (ref 9.4–12.4)
Monocytes: 5 %
Plt Count: 238 10*3/uL (ref 140–370)
RBC: 3.49 10*6/uL — ABNORMAL LOW (ref 4.60–6.10)
RDW: 13.4 % (ref 12.0–14.0)
Segs: 71 %
WBC: 7.6 10*3/uL (ref 4.0–10.0)

## 2021-12-04 LAB — BASIC METABOLIC PANEL, BLOOD
Anion Gap: 12 mmol/L (ref 7–15)
BUN: 22 mg/dL (ref 8–23)
Bicarbonate: 26 mmol/L (ref 22–29)
Calcium: 9.1 mg/dL (ref 8.5–10.6)
Chloride: 107 mmol/L (ref 98–107)
Creatinine: 0.95 mg/dL (ref 0.67–1.17)
Glucose: 143 mg/dL — ABNORMAL HIGH (ref 70–99)
Potassium: 4 mmol/L (ref 3.5–5.1)
Sodium: 145 mmol/L (ref 136–145)
eGFR Based on CKD-EPI 2021 Equation: >60 mL/min

## 2021-12-04 LAB — PHOSPHORUS, BLOOD: Phosphorous: 3.7 mg/dL (ref 2.7–4.5)

## 2021-12-04 LAB — MAGNESIUM, BLOOD: Magnesium: 1.9 mg/dL (ref 1.6–2.4)

## 2021-12-04 MED ORDER — HYDROMORPHONE HCL 1 MG/ML IJ SOLN
0.5000 mg | Freq: Once | INTRAMUSCULAR | Status: AC
Start: 2021-12-04 — End: 2021-12-04
  Administered 2021-12-04 (×2): 0.5 mg via INTRAVENOUS
  Filled 2021-12-04: qty 1

## 2021-12-04 NOTE — Progress Notes (Signed)
Hospital Medicine Progress Note    Admitted on: 12/01/2021  2 Days 18 Hours    Subjective:  O2 4l->RA  Still with cough  Reporting some intermittent diffuse abdominal pain but somewhat improved this morning  +chronic nausea, stable  Denies emesis  Denies fevers, chills  Breathing improved     Objective:    Vitals:  Temperature:  [97.6 F (36.4 C)-98.2 F (36.8 C)] 97.8 F (36.6 C) (12/17 0300)  Blood pressure (BP): (95-135)/(49-63) 126/63 (12/17 0300)  Heart Rate:  [40-60] 45 (12/17 0300)  Respirations:  [12-20] 14 (12/17 0300)  Pain Score: 6 (12/17 0640)  O2 Device: None (Room air) (12/17 0300)  O2 Flow Rate (L/min):  [4 l/min-6 l/min] 4 l/min (12/16 1600)  SpO2:  [95 %-100 %] 95 % (12/17 0300)      Intake/Output Summary (Last 24 hours) at 12/04/2021 0707  Last data filed at 12/04/2021 0400  Gross per 24 hour   Intake 650 ml   Output 1565 ml   Net -915 ml     Physical Exam:  General: no acute distress, sitting comfortably in bed, answers questions appropriately, conversant  HEENT: oropharynx clear, MMM  CVS: regular rate and rhythm, no murmurs, rubs or gallops  Pulm: no increased work of breathing, clear to auscultation bilaterally, no wheezes, rales or rhonci  GI: normoactive bowel sounds, soft, mild epigastric TTP without rebound, non distended  Extrem: warm and well perfused, no lower extremity edema  Skin: no rashes, lesions or ulcers  Neuro: Alert and oriented x3    Labs:  Recent Labs     12/01/21  1329 12/02/21  0303 12/03/21  0355 12/04/21  0456   NA 136 139 141 145   K 4.0 3.5 4.1 4.0   CL 97* 101 105 107   BICARB _0 BUN 16 22 25* 22   CREAT 1.10 1.17 1.05 0.95   [place] 8.5 8.5 8.8 9.1   MG  --  2.0 2.0 1.9   PHOS  --  3.8 3.9 3.7   TP 6.5 6.4  --   --    ALB 3.9 3.5  --   --    TBILI 0.96 0.67  --   --    AST 14 14  --   --    ALT 7 7  --   --    ALK 101 88  --   --        Recent Labs     12/02/21  0303 12/03/21  0355 12/04/21  0456   WBC 7.5 5.7 7.6   HGB 10.3* 9.9* 10.7*   HCT 31.4* 29.2* 31.9*    MCV 91.3 90.4 91.4   PLT 202 192 238   SEG 66 70 71   LYMPHS _1 MONOS _2 EOS 0 0 0       No results for input(s): PTT, INR in the last 72 hours.    Recent Labs     12/02/21  0303   TROPONIN 26*       Lab Results   Component Value Date    COLORUA Light-Yellow 12/01/2021    APPEARUA Clear 12/01/2021    GLUCOSEUA Negative 12/01/2021    BILIUA Negative 12/01/2021    KETONEUA Negative 12/01/2021    SGUA 1.016 12/01/2021    BLOODUA Negative 12/01/2021    PHUA 5.5 12/01/2021    PROTEINUA Trace (A) 12/01/2021  UROBILUA Negative 12/01/2021    NITRITEUA Negative 12/01/2021    LEUKESTUA Negative 12/01/2021    WBCUA 3-5 (A) 12/01/2021    RBCUA 0-2 12/01/2021    HYALINEUA >5 (A) 12/01/2021         Microbiology:  COVID/Influenza 12/14: +COVID  MRSA nares 12/15: negative for mrsa    Antibiotics:  Ceftriaxone 12/15-  Vancomycin 12/15-  Azithro 12/15-    Imaging:  cxr 12/15:  Increased bibasal opacities of atelectasis/pneumonia with possible right pleural effusion.    ctpe 12/15:  1. No evidence of pulmonary embolism.  2. Multifocal pneumonia.  3. Ectatic ascending aorta.  4. Mild amount of coronary artery calcifications.  5. Few additional findings above.    MRI Lumbar Spine 12/16:  1. The last well-formed disc is labeled L5-S1, similar to prior lumbar spine MRI.  2. Since the prior lumbar spine MRI, interval partial L2 and complete L3 laminectomies as well as L2-3 interbody fusion. Interval revision posterior fixation, now from L2-S1. There are additional postsurgical changes related to past L4 laminectomy, L3-4 and L4-5 interbody fusions, and L5-S1 anterior interbody fusion. There is a postsurgical 9.8 x 4.1 cm fluid collection extending from L2-L5 with mild rim enhancement.  3. Retrolisthesis of L2 on L3 has decreased from 5 to 3 mm while grade 2 anterolisthesis of L5 on S1 remains stable at 10 mm.  4. L2-3 spinal canal narrowing has substantially improved with mild residual overall narrowing. However, the  previously seen right paracentral-subarticular inferiorly-directed disc extrusion has enlarged from 15 x 6 to 17 x 7 mm (CC x AP; Se 2, Im 8), associated with complete right lateral recess narrowing. Edema within the extruded component compatible with recent progression. Mild left and mild-moderate right foraminal narrowing, improved on both sides from prior lumbar spine MRI.  5. L3-4 spinal canal narrowing has improved with no residual. Mild left foraminal narrowing, slightly improved. Mild-moderate right foraminal narrowing, slightly improved.  6. No significant spinal canal narrowing in the remaining visualized thoracic and lumbar levels. Persistent moderate-severe left and moderate right L4-5 and severe bilateral L5-S1 foraminal narrowing.  7. There is marrow edema in the L2 vertebral body and L3 superior endplate, likely related to stress reaction and degenerative changes. Infectious process felt to be less likely. No definite abnormal intrathecal enhancement, although evaluation is limited by fixation and motion artifacts.    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     # AHRF  # COVID   # C/f superimposed bacterial PNA   Presented with cough and dyspnea, noted to be hypoxemic and placed on 2L with rapid rise to 7L. Found to be COVID positive as well as imaging with progressing multifocal pneumonia. Overall improving on RA today. CTPE negative for PE. MRSA nares negative.   - Cont CTX/azithro (12/15 - )   -dc vanc given negative mrsa nares  - Remdes (12/15 - )  - Dex (12/15 - )  -  Secretion clearance protocol   - Wean O2 as able - on RA this morning     # Urinary retention  Found to be retaining 300-400cc urine, foley placed. Patient  is a poor historian but reports urinary retention began sometime after surgery in November. Urology was consulted in ED and advised that there was no indication for acute urologic intervention but recommended maintaining foley and follow up with urology. Patient is w/o saddle anesthesia, lower ext numbness/tingling, or bowel incontinence. Has localized tenderness to incision site which is c/d/i. Given recent ortho spine surgery lumbar spine fusion in setting of severe compression, ortho spine consulted, recommended MRI lumbar spine. Showing post surgical fluid collection ?mild rim enchancing, will discuss with ortho post surgical changes vs infectious.  - Continue foley  - Urology consulted, appreciate recs              - Will arrange outpatient f/u if still with foley on D/C  - Consult ortho, recs appreciated               - discuss MRI findings with ortho    # L urothelial cancer   - FYI heme/onc at discharge     # HTN  - Cont home amlodipine     # Gout  - Cont home allopurinol    # MDD  - Cont home duloxetine  - Cont home pregabalin    #FEN: regular    #PPx: SQH    #Code: [place]    # Dispo: imporved oxygen requirement and discussion with ortho regarding MRI spine findings; anticipate 24-48 hours       Jodelle Gross, [place]  [phone]      Medications:  Scheduled Meds:  . allopurinol  300 mg Daily   . [Manual MAR Hold] amLODIPINE  10 mg Daily   . azithromycin  500 mg Daily   . dexAMETHasone  6 mg Daily   . docusate sodium  100 mg TID   . DULoxetine  20 mg Daily   . heparin  5,000 Units Q8H   . pregabalin  100 mg HS   . remdesivir (VEKLURY) IVPB  100 mg Q24H NR   . sodium chloride  3 mL Q8H      Continuous Infusions:  . cefTRIAXone (ROCEPHIN) IV 1,000 mg (12/04/21 0641)   . sodium chloride       PRN Meds:  . benzonatate  100 mg Q8H PRN   .  guaiFENesin-dextromethorphan  10 mL Q4H PRN   . ipratropium-albuterol  3 mL Q6H PRN   . menthol  1 lozenge Q2H PRN   . nalOXone  0.1 mg Q2 Min PRN   . ondansetron  8 mg Q8H PRN   . oxyCODONE  5 mg Q6H PRN   . sodium chloride  3 mL PRN   . sodium chloride   Continuous PRN           Allergies:  Allergies   Allergen Reactions   . Amoxicillin Rash   . Sulfa Drugs Unspecified       Lines:  Patient Lines/Drains/Airways Status     Active PICC Line / CVC Line / PIV Line / Drain / Airway / Intraosseous Line / Epidural Line / ART Line / Line Type     Name Placement date Placement time Site Days    Peripheral IV - 18 G Left Forearm 12/03/21  2220  Forearm  less than 1    Indwelling Urinary Catheter -  12/01/21 RN Coude 16 fr 10 ml Yes 12/01/21  1631  Coude  2

## 2021-12-05 ENCOUNTER — Other Ambulatory Visit: Payer: Self-pay

## 2021-12-05 LAB — BASIC METABOLIC PANEL, BLOOD
Anion Gap: 12 mmol/L (ref 7–15)
BUN: 18 mg/dL (ref 8–23)
Bicarbonate: 26 mmol/L (ref 22–29)
Calcium: 8.7 mg/dL (ref 8.5–10.6)
Chloride: 103 mmol/L (ref 98–107)
Creatinine: 0.92 mg/dL (ref 0.67–1.17)
Glucose: 145 mg/dL — ABNORMAL HIGH (ref 70–99)
Potassium: 3.6 mmol/L (ref 3.5–5.1)
Sodium: 141 mmol/L (ref 136–145)
eGFR Based on CKD-EPI 2021 Equation: >60 mL/min

## 2021-12-05 LAB — CBC WITH DIFF, BLOOD
ANC-Automated: 3.5 10*3/uL (ref 1.6–7.0)
Abs Basophils: 0 10*3/uL (ref <0.2)
Abs Eosinophils: 0 10*3/uL (ref 0.0–0.5)
Abs Lymphs: 2.1 10*3/uL (ref 0.8–3.1)
Abs Monos: 0.5 10*3/uL (ref 0.2–0.8)
Basophils: 0 %
Eosinophils: 0 %
Hct: 31.2 % — ABNORMAL LOW (ref 40.0–50.0)
Hgb: 10.5 gm/dL — ABNORMAL LOW (ref 13.7–17.5)
Imm Gran %: 1 % — ABNORMAL HIGH (ref <1)
Imm Gran Abs: 0.1 10*3/uL — ABNORMAL HIGH (ref <0.1)
Lymphocytes: 33 %
MCH: 30.2 pg (ref 26.0–32.0)
MCHC: 33.7 g/dL (ref 32.0–36.0)
MCV: 89.7 um3 (ref 79.0–95.0)
MPV: 10.4 fL (ref 9.4–12.4)
Monocytes: 8 %
Plt Count: 224 10*3/uL (ref 140–370)
RBC: 3.48 10*6/uL — ABNORMAL LOW (ref 4.60–6.10)
RDW: 13.3 % (ref 12.0–14.0)
Segs: 57 %
WBC: 6.1 10*3/uL (ref 4.0–10.0)

## 2021-12-05 LAB — PHOSPHORUS, BLOOD: Phosphorous: 3.8 mg/dL (ref 2.7–4.5)

## 2021-12-05 LAB — MAGNESIUM, BLOOD: Magnesium: 1.9 mg/dL (ref 1.6–2.4)

## 2021-12-05 MED ORDER — MAGNESIUM SULFATE 2 GM/50ML IV SOLN
2.0000 g | Freq: Once | INTRAVENOUS | Status: AC
Start: 2021-12-05 — End: 2021-12-05
  Administered 2021-12-05 (×2): 2 g via INTRAVENOUS
  Filled 2021-12-05: qty 50

## 2021-12-05 MED ORDER — AMOXICILLIN-POT CLAVULANATE 875-125 MG OR TABS
1.0000 | ORAL_TABLET | Freq: Two times a day (BID) | ORAL | Status: DC
Start: 2021-12-06 — End: 2021-12-06

## 2021-12-05 NOTE — Progress Notes (Signed)
Stephen [name] day:   4 days - Admitted on: 12/01/2021    Stephen [name] is a 70 year old male with pertinent history of of solitary L kidney, urothelial cancer recently completed chemo, MDD, transurethral resection of the prostate, recent XLIF L2-L3 and revision T10-pelvis on 11/21, who presents for acutely decreased urinary output, and AHRF c/f superimposed PNA on COVID.     24 Hour - Interval Events   NAOE    Subjective   Reporting unchanged fatigue, otherwise endorses breathing better. Still with lingering cough, helped by cough syrup. No chest pain, lightheadedness/dizziness.     Physical Exam   Temp  Min: 97.8 F (36.6 C)  Max: 98.7 F (37.1 C)  Pulse  Min: 42  Max: 68  BP  Min: 121/59  Max: 139/73  Resp  Min: 11  Max: 21  SpO2  Min: 92 %  Max: 100 %    BP 136/74 (BP Location: Left arm, BP Patient Position: Other (Comment))   Pulse (!) 46   Temp 98.7 F (37.1 C)   Resp 13   Ht 5\' 6"  (1.676 m)   Wt 90 kg (198 lb 6.6 oz)   SpO2 94%   BMI 32.02 kg/m  O2 Device: None (Room air)      12/17 0600 - 12/18 0559  In: 480 [P.O.:480]  Out: 2375 [Urine:2375]  Urine x 0 Stool x 0 Emesis x 0       GEN: Awake, alert, in NAD  HEENT: PERRL, EOMI, normal conjunctivae  CV: RRR, no murmurs  Resp: CTAB  GI: Soft, nontender, nondistended  MSK: Mild tenderness to palpation over incision site at lumbar spine, which is c/d/i. No surrounding erythema or warmth. Full strength and ROM in all four extremities.  Neuro: AOx4. No focal neuro deficits.     Pertinent Labs and Imaging and Meds       WBC 6.1 (12/18) HGB 10.5* (12/18) PLT 224 (12/18)    HCT 31.2* (12/18)      %Neutrophils 57 (12/18) %Bands   %Lymphs 33 (12/18) %Monos 8 (12/18) %Eos 0 (12/18) %Basos 0 (12/18)  Na 141 (12/18) CL 103 (12/18) BUN 18 (12/18) GLU   145* (12/18)   K 3.6 (12/18) CO2 26 (12/18) Cr 0.92 (12/18)      [place] 8.7 (12/18) Mg 1.9 (12/18) Phos 3.8 (12/18)      Scheduled Medications  . allopurinol  300 mg Daily   . [Manual MAR  Hold] amLODIPINE  10 mg Daily   . [START ON 12/06/2021] amoxicillin-clavulanate  1 tablet Q12H   . docusate sodium  100 mg TID   . DULoxetine  20 mg Daily   . heparin  5,000 Units Q8H   . magnesium sulfate  2 g Once   . pregabalin  100 mg HS   . sodium chloride  3 mL Q8H     Continuous Medications  . sodium chloride       PRN Medications  . benzonatate  100 mg Q8H PRN   . guaiFENesin-dextromethorphan  10 mL Q4H PRN   . ipratropium-albuterol  3 mL Q6H PRN   . menthol  1 lozenge Q2H PRN   . nalOXone  0.1 mg Q2 Min PRN   . ondansetron  8 mg Q8H PRN   . oxyCODONE  5 mg Q6H PRN   . sodium chloride  3 mL PRN   . sodium chloride   Continuous PRN  CXR 12/15  IMPRESSION:   Findings compatible with pneumonia.    CXR 12/15  IMPRESSION:   Increased bibasal opacities of atelectasis/pneumonia with possible right pleural effusion.     Renal U/S 12/14  IMPRESSION:   Persistent stable mild-to-moderate hydronephrosis, which is overall similar compared to MR 10/25/2021. No obstructing stones visualized.     MRI L spine 12/16:  IMPRESSION:  1. The last well-formed disc is labeled L5-S1, similar to prior lumbar spine MRI.    2. Since the prior lumbar spine MRI, interval partial L2 and complete L3 laminectomies as well as L2-3 interbody fusion. Interval revision posterior fixation, now from L2-S1. There are additional postsurgical changes related to past L4 laminectomy, L3-4 and L4-5 interbody fusions, and L5-S1 anterior interbody fusion. There is a postsurgical 9.8 x 4.1 cm fluid collection extending from L2-L5 with mild rim enhancement.    3. Retrolisthesis of L2 on L3 has decreased from 5 to 3 mm while grade 2 anterolisthesis of L5 on S1 remains stable at 10 mm.    4. L2-3 spinal canal narrowing has substantially improved with mild residual overall narrowing. However, the previously seen right paracentral-subarticular inferiorly-directed disc extrusion has enlarged from 15 x 6 to 17 x 7 mm (CC x AP; Se 2, Im 8), associated  with complete right lateral recess narrowing. Edema within the extruded component compatible with recent progression. Mild left and mild-moderate right foraminal narrowing, improved on both sides from prior lumbar spine MRI.    5. L3-4 spinal canal narrowing has improved with no residual. Mild left foraminal narrowing, slightly improved. Mild-moderate right foraminal narrowing, slightly improved.    6. No significant spinal canal narrowing in the remaining visualized thoracic and lumbar levels. Persistent moderate-severe left and moderate right L4-5 and severe bilateral L5-S1 foraminal narrowing.    7. There is marrow edema in the L2 vertebral body and L3 superior endplate, likely related to stress reaction and degenerative changes. Infectious process felt to be less likely. No definite abnormal intrathecal enhancement, although evaluation is limited by fixation and motion artifacts.    Assessment / Plan   Stephen [name] is a 71 M with PMHx of solitary L kidney, urothelial cancer recently completed chemo, MDD, transurethral resection of the prostate, recent XLIF L2-L3 and revision T10-pelvis on 11/21, who presents for acutely decreased urinary output, and AHRF c/f superimposed PNA on COVID.     # AHRF  # COVID   # C/f superimposed bacterial PNA   Presented with cough and dyspnea, noted to be hypoxemic and placed on 2LNC. Found to be COVID positive; patient had 2 COVID vaccines a couple years ago, no booster vaccines since. CXR with opacities suggestive of right atelectasis from mucous plugging, and possible PNA. Patient had acutely increased O2 requirements overnight to 6L; repeat CXR without significant change. Given acute hypoxemia will obtain CTA PE, start on IV abx for PNA coverage, and start COVID treatment. Patient now transitioned to RA.  - Ambulatory O2 today  - PT eval today  - CTX (12/15 - 12/18) --> augmentin (12/19)  - D/c remdes (12/15 - 12/18)  - D/c dex (12/15 - 12/18)  - Secretion clearance  protocol   - Wean O2 as able    # Urinary retention  Found to be retaining 300-400cc urine, foley placed. Patient is a poor historian but reports urinary retention began sometime after surgery in November. Urology was consulted in ED and advised that there was no indication for acute urologic intervention. Patient  is w/o saddle anesthesia, lower ext numbness/tingling, or bowel incontinence. Has localized tenderness to incision site which is c/d/i.   - Continue foley  - Urology consulted, appreciate recs   - Will arrange outpatient f/u   - Consult ortho, recs appreciated    - MRI L spine not concerning   - Will f/u with ortho outpatient    # L urothelial cancer   - FYI heme/onc in AM    # HTN  - Cont home amlodipine     # Gout  - Cont home allopurinol    # MDD  - Cont home duloxetine  - Cont home pregabalin       Disposition Planning 12/05/2021:  Remaining discharge needs: clinical improvement and consultant input (ortho, uro)   Discharge location: To be determined  Outpatient Follow-up Needed: TBD  Estimated Discharge: 24-48 hours    FOLEY   VTE prophylaxis: SQ heparin  Diet: Diet Regular  Last BM: 12/03/21 1800   IV fluids: No IV fluids  Access: PIVs  Code status: Full Code      Patient care was discussed in detail with Vicenta Dunning, MD.    Meade Maw  Internal Medicine, PGY-1

## 2021-12-06 ENCOUNTER — Other Ambulatory Visit: Payer: Self-pay | Admitting: *Deleted

## 2021-12-06 ENCOUNTER — Other Ambulatory Visit: Payer: Medicare Other

## 2021-12-06 ENCOUNTER — Other Ambulatory Visit
Admission: RE | Admit: 2021-12-06 | Discharge: 2021-12-06 | Disposition: A | Discharge status: Home / Self Care | Payer: Medicare Other | Attending: Cardiovascular Disease | Admitting: Cardiovascular Disease

## 2021-12-06 ENCOUNTER — Other Ambulatory Visit: Payer: Self-pay

## 2021-12-06 ENCOUNTER — Other Ambulatory Visit (HOSPITAL_COMMUNITY): Payer: Self-pay | Admitting: Internal Medicine

## 2021-12-06 ENCOUNTER — Inpatient Hospital Stay (HOSPITAL_BASED_OUTPATIENT_CLINIC_OR_DEPARTMENT_OTHER): Payer: Medicare Other

## 2021-12-06 ENCOUNTER — Ambulatory Visit (HOSPITAL_BASED_OUTPATIENT_CLINIC_OR_DEPARTMENT_OTHER): Payer: Medicare Other | Admitting: Nurse Practitioner

## 2021-12-06 DIAGNOSIS — E785 Hyperlipidemia, unspecified: Secondary | ICD-10-CM | POA: Diagnosis present

## 2021-12-06 DIAGNOSIS — R109 Unspecified abdominal pain: Secondary | ICD-10-CM

## 2021-12-06 DIAGNOSIS — G8929 Other chronic pain: Secondary | ICD-10-CM

## 2021-12-06 LAB — EMMI , HEART FAILURE - SODIUM: EMMI Video Order Number: 16590990582

## 2021-12-06 LAB — BASIC METABOLIC PANEL, BLOOD
Anion Gap: 12 mmol/L (ref 7–15)
BUN: 16 mg/dL (ref 8–23)
Bicarbonate: 27 mmol/L (ref 22–29)
Calcium: 9 mg/dL (ref 8.5–10.6)
Chloride: 100 mmol/L (ref 98–107)
Creatinine: 1 mg/dL (ref 0.67–1.17)
Glucose: 121 mg/dL — ABNORMAL HIGH (ref 70–99)
Potassium: 3.6 mmol/L (ref 3.5–5.1)
Sodium: 139 mmol/L (ref 136–145)
eGFR Based on CKD-EPI 2021 Equation: >60 mL/min

## 2021-12-06 LAB — CBC WITH DIFF, BLOOD
ANC-Automated: 4.4 10*3/uL (ref 1.6–7.0)
Abs Basophils: 0 10*3/uL (ref <0.2)
Abs Eosinophils: 0 10*3/uL (ref 0.0–0.5)
Abs Lymphs: 2.4 10*3/uL (ref 0.8–3.1)
Abs Monos: 0.7 10*3/uL (ref 0.2–0.8)
Basophils: 1 %
Eosinophils: 0 %
Hct: 32.6 % — ABNORMAL LOW (ref 40.0–50.0)
Hgb: 11.2 gm/dL — ABNORMAL LOW (ref 13.7–17.5)
Imm Gran %: 3 % (ref <1)
Imm Gran Abs: 0.2 10*3/uL (ref <0.1)
Lymphocytes: 31 %
MCH: 30.4 pg (ref 26.0–32.0)
MCHC: 34.4 g/dL (ref 32.0–36.0)
MCV: 88.6 um3 (ref 79.0–95.0)
MPV: 9.7 fL (ref 9.4–12.4)
Monocytes: 9 %
Plt Count: 232 10*3/uL (ref 140–370)
RBC: 3.68 10*6/uL — ABNORMAL LOW (ref 4.60–6.10)
RDW: 13.2 % (ref 12.0–14.0)
Segs: 57 %
WBC: 7.8 10*3/uL (ref 4.0–10.0)

## 2021-12-06 LAB — EMMI , HEART FAILURE: EMMI Video Order Number: 16967699519

## 2021-12-06 LAB — EMMI , HYPERTENSION: EMMI Video Order Number: 10515325990

## 2021-12-06 LAB — EMMI , HEART FAILURE - DAILY WEIGHT: EMMI Video Order Number: 18195456860

## 2021-12-06 LAB — PHOSPHORUS, BLOOD: Phosphorous: 4 mg/dL (ref 2.7–4.5)

## 2021-12-06 LAB — MAGNESIUM, BLOOD: Magnesium: 2 mg/dL (ref 1.6–2.4)

## 2021-12-06 LAB — LIPID PANEL
Cholesterol: 171 mg/dL (ref 0–200)
HDL: 54 mg/dL (ref >40)
LDL Cholesterol: 76 mg/dL (ref 0–99)
Total CHOL/HDL Ratio: 3.2 RATIO
Triglycerides: 203 mg/dL — ABNORMAL HIGH (ref <150)
VLDL: 41 mg/dL — ABNORMAL HIGH (ref 0–40)

## 2021-12-06 LAB — HEPATIC FUNCTION PANEL
ALT: 24 U/L (ref 0–44)
AST: 24 U/L (ref 15–41)
Albumin: 3.9 g/dL (ref 3.5–5.0)
Alkaline Phosphatase: 96 U/L (ref 38–126)
Bilirubin, Direct: <0.1 mg/dL (ref 0.0–0.2)
Total Bilirubin: 1 mg/dL (ref 0.3–1.2)
Total Protein: 7.6 g/dL (ref 6.5–8.1)

## 2021-12-06 MED ORDER — POLYETHYLENE GLYCOL 3350 OR PACK
17.0000 g | PACK | Freq: Every day | ORAL | Status: DC
Start: 2021-12-06 — End: 2021-12-06

## 2021-12-06 MED ORDER — LIDOCAINE VISCOUS 2 % MT SOLN
10.0000 mL | Freq: Once | OROMUCOSAL | Status: AC
Start: 2021-12-06 — End: 2021-12-06
  Administered 2021-12-06 (×2): 10 mL via ORAL
  Filled 2021-12-06: qty 15

## 2021-12-06 MED ORDER — LIDOCAINE 4 % EX PTCH
1.0000 | MEDICATED_PATCH | CUTANEOUS | 0 refills | Status: DC
Start: 2021-12-06 — End: 2022-12-01

## 2021-12-06 MED ORDER — HYDROMORPHONE HCL 1 MG/ML IJ SOLN
1.0000 mg | Freq: Once | INTRAMUSCULAR | Status: AC
Start: 2021-12-06 — End: 2021-12-06
  Administered 2021-12-06 (×2): 1 mg via INTRAVENOUS
  Filled 2021-12-06: qty 1

## 2021-12-06 MED ORDER — INFLUENZA VAC 65+ YRS WRAPPED RECORD (~~LOC~~)
1.0000 | PREFILLED_SYRINGE | INTRAMUSCULAR | Status: AC
Start: 2021-12-06 — End: 2021-12-06
  Filled 2021-12-06: qty 1

## 2021-12-06 MED ORDER — ONDANSETRON 8 MG OR TBDP
8.0000 mg | ORAL_TABLET | Freq: Three times a day (TID) | ORAL | 0 refills | Status: DC | PRN
Start: 2021-12-06 — End: 2022-09-15

## 2021-12-06 MED ORDER — POTASSIUM CHLORIDE 20 MEQ OR PACK
20.0000 meq | PACK | Freq: Once | ORAL | Status: DC
Start: 2021-12-06 — End: 2021-12-06

## 2021-12-06 MED ORDER — SIMETHICONE 80 MG OR CHEW
80.0000 mg | CHEWABLE_TABLET | Freq: Four times a day (QID) | ORAL | Status: DC | PRN
Start: 2021-12-06 — End: 2021-12-06

## 2021-12-06 MED ORDER — ALUM & MAG HYDROXIDE-SIMETH 200-200-20 MG/5ML OR SUSP
30.0000 mL | Freq: Once | ORAL | Status: AC
Start: 2021-12-06 — End: 2021-12-06
  Administered 2021-12-06 (×2): 30 mL via ORAL
  Filled 2021-12-06: qty 30

## 2021-12-06 MED ORDER — PNEUMOCOCCAL VAC POLYVALENT 25 MCG/0.5ML IJ INJ (CUSTOM)
0.5000 mL | INJECTION | INTRAMUSCULAR | Status: DC
Start: 2021-12-06 — End: 2021-12-06

## 2021-12-06 MED ORDER — POLYETHYLENE GLYCOL 3350 OR POWD
17.0000 g | Freq: Every day | ORAL | 0 refills | Status: DC
Start: 2021-12-06 — End: 2021-12-24
  Filled 2021-12-06: qty 255, fill #0
  Filled 2021-12-06: qty 238, 14d supply, fill #0

## 2021-12-06 MED ORDER — LIDOCAINE 4 % EX PTCH
1.0000 | MEDICATED_PATCH | CUTANEOUS | Status: DC
Start: 2021-12-06 — End: 2021-12-06

## 2021-12-06 MED ORDER — BISACODYL 10 MG RE SUPP
10.0000 mg | Freq: Once | RECTAL | Status: DC
Start: 2021-12-06 — End: 2021-12-06
  Filled 2021-12-06: qty 1

## 2021-12-06 MED ORDER — AMOXICILLIN-POT CLAVULANATE 875-125 MG OR TABS
1.0000 | ORAL_TABLET | Freq: Two times a day (BID) | ORAL | 0 refills | Status: AC
Start: 2021-12-06 — End: 2021-12-07

## 2021-12-06 MED ORDER — BENZONATATE 100 MG OR CAPS
100.0000 mg | ORAL_CAPSULE | Freq: Three times a day (TID) | ORAL | 0 refills | Status: DC | PRN
Start: 2021-12-06 — End: 2022-01-19

## 2021-12-06 MED ORDER — GUAIFENESIN-DM 100-10 MG/5ML OR SYRP
10.0000 mL | ORAL_SOLUTION | ORAL | 0 refills | Status: DC | PRN
Start: 2021-12-06 — End: 2022-01-19

## 2021-12-06 MED ORDER — MENTHOL 3 MG MT LOZG
1.0000 | LOZENGE | OROMUCOSAL | 0 refills | Status: DC | PRN
Start: 2021-12-06 — End: 2022-01-19

## 2021-12-06 MED ORDER — ONDANSETRON HCL 4 MG/2ML IV SOLN
4.0000 mg | Freq: Four times a day (QID) | INTRAMUSCULAR | Status: DC | PRN
Start: 2021-12-06 — End: 2021-12-06
  Administered 2021-12-06 (×2): 4 mg via INTRAVENOUS
  Filled 2021-12-06: qty 2

## 2021-12-06 MED ORDER — ATORVASTATIN CALCIUM 80 MG PO TABS: ORAL_TABLET | ORAL | 0 refills | Status: DC

## 2021-12-06 NOTE — Interdisciplinary (Signed)
PIV removed, f/c changed over to a leg bag, med to bed completed, reviewed AVS with pt. Pt stated understanding of instructions, verified he had all of his belongings, and stated he will call his sister to come pick him up in about an hour.

## 2021-12-06 NOTE — Plan of Care (Signed)
Problem: Promotion of Health and Safety  Goal: Promotion of Health and Safety  Description: The patient remains safe, receives appropriate treatment and achieves optimal outcomes (physically, psychosocially, and spiritually) within the limitations of the disease process by discharge.    Information below is the current care plan.  Outcome: Progressing  Flowsheets  Taken 12/06/2021 1542 by Horald Chestnut, RN  Individualized Interventions/Recommendations #1: discharge to home  Outcome Evaluation (rationale for progressing/not progressing) every shift: pt d/c to home in care of sister. pt states he will take miralax and colace when he gets home, declining the med he requested here. pt stated understanding of AVS, f/c changed over to leg bag.  Taken 12/06/2021 0941 by Horald Chestnut, RN  Patient /Family stated Goal: "get a leg bag for my catheter when I go home"  Taken 12/02/2021 0351 by Judye Bos, RN  Guidelines: Inpatient Nursing Guidelines  Note:

## 2021-12-06 NOTE — Telephone Encounter (Signed)
Form has not been received to my knowledge, will keep an eye out for it.

## 2021-12-06 NOTE — Interdisciplinary (Signed)
Physical Therapy Evaluation and Discharge    Admitting Physician:  Vicenta Dunning, MD  Admission Date 12/01/2021    Inpatient Diagnosis:   Problem List       Codes    Urinary retention    -  Primary ICD-10-CM: R33.9  ICD-9-CM: 788.20    Relevant Orders    Consult/Referral to Urology    Orthopedics Clinic    Other chronic pain     ICD-10-CM: G89.29  ICD-9-CM: 338.29    Relevant Medications    lidocaine (ASPERCREME) 4 % patch    History of cancer of ureter     ICD-10-CM: Z85.54  ICD-9-CM: V10.59    Relevant Orders    Consult/Referral to Urology    Lab test positive for detection of COVID-19 virus     ICD-10-CM: U07.1  ICD-9-CM: 079.89    Relevant Medications    albuterol 108 (90 Base) MCG/ACT inhaler    Pneumonia of both lungs due to infectious organism, unspecified part of lung     ICD-10-CM: J18.9  ICD-9-CM: 483.8    Relevant Medications    amoxicillin-clavulanate (AUGMENTIN) 875-125 MG tablet    benzonatate (TESSALON) 100 MG capsule    guaiFENesin-dextromethorphan (ROBITUSSIN DM) 100-10 MG/5ML syrup    menthol (CEPACOL) 3 MG lozenge    ondansetron (ZOFRAN ODT) 8 MG disintegrating tablet    Impaired mobility     ICD-10-CM: Z74.09  ICD-9-CM: 799.89          IP Start of Service   Start of Care: 12/06/21  Reason for referral: Activity tolerance limitation;Decline in functional ability/mobility;Range of motion/strength limitations    Preferred [place]         Past Medical History:   Diagnosis Date   . Chronic back pain    . Congenital hydronephrosis    . Gout    . Headache    . Hematuria    . Kidney disease    . Kidney stones    . Major depressive disorder, single episode    . Polyarthropathy or polyarthritis of multiple sites    . Retinal detachment    . Urethral stricture       Past Surgical History:   Procedure Laterality Date   . CT INSERTION OF SUPRAPUBIC CATH  09/25/2015   . NEPHRECTOMY Right 1995   . APPENDECTOMY     . COLONOSCOPY     . CYSTOSCOPY     . CYSTOSCOPY W/ LASER LITHOTRIPSY     . OTHER  SURGICAL HISTORY      Interstim 01/29/2011   . SPINE SURGERY  09/21    Lumbar-sacral fusion   . TRANSURETHRAL RESECTION OF PROSTATE          PT Acute     Row Name 12/06/21 1200          Type of Visit    Type of Physical Therapy note Physical Therapy Evaluation and Discharge     Row Name 12/06/21 1200          Treatment Precautions/Restrictions    Precautions/Restrictions Fall;Isolation;Spine     Fall Socks/charm     Row Name 12/06/21 1200          Medical History    Fall history No falls reported in the last 6 months     [place] Name 12/06/21 1200          Functional History    Prior Level of Function No deficits     Equipment required for mobility in the home  [place] Name 12/06/21 1200          Social History    Living Situation Lives with parent/family     [place] accessibility  Performs activities of daily living (ADL's) on one level     Other Social History Information lives with sister     [place] Name 12/06/21 1200          Subjective    Subjective Information "I just need to get this stomach pain figured out"     Patient status Patient agreeable to treatment;Nursing in agreement for treatment     [place] Name 12/06/21 1200          Pain Assessment    Pain Asssessment Tool Numeric Pain Rating Scale     Row Name 12/06/21 1200          Numeric Pain Rating Scale    Pain Intensity - rating at present 5     Pain Intensity- rating after treatment 5     Location abdomen     Row Name 12/06/21 1200          Objective    Overall Cognitive Status Intact - no cognitive limitations or impairments noted     Communication No communication limitations or impairments noted. Current status of hearing, speech and vision allow functional communication.     Coordination/Motor control No limitations or impairments noted. Movement patterns are fluid and coordinated throughout     Balance Balance limitations present     Static Sitting Balance Normal - able to maintain steady balance without handhold support      Dynamic Sitting Balance Normal - accepts maximal challenge and can shift weight easily within full range in all directions     Static Standing Balance Good - able to maintain balance without handhold support, limited postural sway     Dynamic Standing Balance Good - accepts moderate challenge, able to maintain balance while picking object off floor     Extremity Assessment Flexibility, strength, muscle tone and sensation grossly within functional limits throughout     Functional Mobility Functional mobility deficits present     Bed Mobility Independent     Bed Mobility Comments good compliance with logroll/spinal precautions     Transfers to/from Stand Independent     Gait Independent     Gait Comments step through pattern, steady, no LOB, c/o abdominal pain     Device used for ambulation/mobility None     Ambulation Distance 50' in room     Other Objective Findings VSS on RA. Pt states he is at his baseline mobility level. Pt preoccupied with abdominal pain and coordination of ride home during session.                      Eval cont.     [place] Name 12/06/21 1200          [place] AM-PAC: Basic Mobility    Assistance Needed to Turn from Back to Side While in a Flat Bed Without Using Bedrails 4 - None (independent)     Difficulty with Supine to Sit Transfer 4 - None (independent)     How Much Help Needed to Move to/from Bed to Chair 4 - None (independent)     Difficulty with Sit to Stand Transfer from Chair with Arms 4 - None (independent)     How Much Help Needed to Walk in Room 4 -  None (independent)     How Much Help Needed to Climb 3-5 Steps with a Rail 4 - None (independent)     AMPAC Total Score 24     Assessment: AM-PAC Basic Mobility Impairment Rating Score 24 - 0% impaired     Row Name 12/06/21 1200          Patient/Family Education    Learner(s) Patient     Learner response to rehab patient education interventions Verbalizes understanding;Able to return demonstrate teaching     [place] Name 12/06/21 1200           Assessment    Assessment Pt demonstrates positive response to PT eval as evidenced by good tolerance of OOB mobility and ambulation in room at baseline independent level. Pt feels that his is at his baseline and has been independently ambulating in room as needed. Advised pt to follow up with OP PT upon d/c, pt agreeable. Pt has no further IP PT needs at this time, PT to sign off.     Rehab Potential Good     Row Name 12/06/21 1200          Patient stated Goal    Patient stated goal have less abodminal pain     Row Name 12/06/21 1200          Treatment Plan    Frequency of treatment Patient appropriate for discharge from therapy     [place] Name 12/06/21 1200          Therapy Plan Communication    Therapy Plan Communication Discussed therapy plan and patient's mobility status with Case Manager;Discussed therapy plan with Nursing and/or Physician     Row Name 12/06/21 1200          Type of Eval    Low Complexity [phone]) Completed     [place] Name 12/06/21 1200          Treatment Time     Total TIMED Treatment  (min) 15     Total Treatment Time (min) 20     Treatment start time 1110               Post Acute Discharge Recommendations  Discharge Rehabilitation Recommendations : Patient appropriate for discharge to independent living situation;If available, patient would benefit from ongoing therapy  Therapy type : Outpatient  Equipment recommendations: No equipment needed - patient has own equipment    The physical therapist of record is endorsed by evaluating physical therapist.

## 2021-12-06 NOTE — Plan of Care (Signed)
Problem: Promotion of Health and Safety  Goal: Promotion of Health and Safety  Description: The patient remains safe, receives appropriate treatment and achieves optimal outcomes (physically, psychosocially, and spiritually) within the limitations of the disease process by discharge.    Information below is the current care plan.  Outcome: Progressing  Flowsheets  Taken 12/06/2021 0346 by Garnet Koyanagi, RN  Individualized Interventions/Recommendations #1: give prn zofran for nausea  Individualized Interventions/Recommendations #2 (if applicable): monitor pain and treat at needed  Individualized Interventions/Recommendations #3 (if applicable): monitor vitals and telemetry  Outcome Evaluation (rationale for progressing/not progressing) every shift: pt continues to progress. VSS. afebrile. endorsing mild back pain - given scheduled Lyrica with effect. Pt continues to endorse nausea - given prn zofran with good effect. Safety precautions maintained. Will continue to monitor  Taken 12/02/2021 0845 by Sharyon Medicus, RN  Patient /Family stated Goal: Go home when I'm better  Note:

## 2021-12-06 NOTE — Progress Notes (Signed)
[facility] Hospital day:   5 days - Admitted on: 12/01/2021    Stephen [name] is a 70 year old male with pertinent history of of solitary L kidney, urothelial cancer recently completed chemo, MDD, transurethral resection of the prostate, recent XLIF L2-L3 and revision T10-pelvis on 11/21, who presents for acutely decreased urinary output, and AHRF c/f superimposed PNA on COVID.     24 Hour - Interval Events   6 PM - held docusate as having diarrhea   2 AM - IV zofran   5 AM - abdominal pain - assessed, no guarding, diffuse pain to deep palpation, VS NL; gave simethicone, GI cocktail, lidoacine patch     Subjective   States abdominal pain improved this morning but wary about having the pain return.   No nausea/vomiting.  Denies having diarrhea yesterday. States his last bm was actually several days ago in ED.     Physical Exam   Temp  Min: 97.3 F (36.3 C)  Max: 98.7 F (37.1 C)  Pulse  Min: 44  Max: 54  BP  Min: 116/65  Max: 156/71  Resp  Min: 13  Max: 18  SpO2  Min: 98 %  Max: 99 %    BP 116/65 (BP Location: Right arm, BP Patient Position: Semi-Fowlers)   Pulse 54   Temp 97.8 F (36.6 C)   Resp 17   Ht 5\' 6"  (1.676 m)   Wt 83.3 kg (183 lb 10.3 oz)   SpO2 98%   BMI 29.64 kg/m  O2 Device: None (Room air)      12/18 0600 - 12/19 0559  In: 240 [P.O.:240]  Out: 1800 [Urine:1800]  Urine x 0 Stool x 0 Emesis x 0       GEN: Awake, alert, in NAD  HEENT: PERRL, EOMI, normal conjunctivae  CV: RRR, no murmurs   Resp: CTAB  GI: Soft, nontender, nondistended  MSK: Mild tenderness to palpation over incision site at lumbar spine, which is c/d/i. No surrounding erythema or warmth. Full strength and ROM in all four extremities.  Neuro: AOx4. No focal neuro deficits.     Pertinent Labs and Imaging and Meds       WBC 7.8 (12/19) HGB 11.2* (12/19) PLT 232 (12/19)    HCT 32.6* (12/19)      %Neutrophils 57 (12/19) %Bands   %Lymphs 31 (12/19) %Monos 9 (12/19) %Eos 0 (12/19) %Basos 1 (12/19)  Na 139  (12/19) CL 100 (12/19) BUN 16 (12/19) GLU   121* (12/19)   K 3.6 (12/19) CO2 27 (12/19) Cr 1.00 (12/19)      [place] 9.0 (12/19) Mg 2.0 (12/19) Phos 4.0 (12/19)      Scheduled Medications  . allopurinol  300 mg Daily   . [Manual MAR Hold] amLODIPINE  10 mg Daily   . amoxicillin-clavulanate  1 tablet Q12H   . bisacodyl  10 mg Once   . [Manual MAR Hold] docusate sodium  100 mg TID   . DULoxetine  20 mg Daily   . heparin  5,000 Units Q8H   . influenza vaccine >=65 yrs  1 Dose Prior to discharge   . lidocaine  1 patch Q24H   . pneumococcal 23-valent vaccine  0.5 mL Prior to discharge   . potassium chloride  20 mEq Once   . potassium chloride  20 mEq Once   . pregabalin  100 mg HS   . sodium chloride  3 mL  Q8H     Continuous Medications  . sodium chloride       PRN Medications  . benzonatate  100 mg Q8H PRN   . guaiFENesin-dextromethorphan  10 mL Q4H PRN   . ipratropium-albuterol  3 mL Q6H PRN   . menthol  1 lozenge Q2H PRN   . nalOXone  0.1 mg Q2 Min PRN   . ondansetron  8 mg Q8H PRN   . ondansetron  4 mg Q6H PRN   . oxyCODONE  5 mg Q6H PRN   . simethicone  80 mg Q6H PRN   . sodium chloride  3 mL PRN   . sodium chloride   Continuous PRN        CXR 12/15  IMPRESSION:   Findings compatible with pneumonia.    CXR 12/15  IMPRESSION:   Increased bibasal opacities of atelectasis/pneumonia with possible right pleural effusion.     Renal U/S 12/14  IMPRESSION:   Persistent stable mild-to-moderate hydronephrosis, which is overall similar compared to MR 10/25/2021. No obstructing stones visualized.     MRI L spine 12/16:  IMPRESSION:  1. The last well-formed disc is labeled L5-S1, similar to prior lumbar spine MRI.    2. Since the prior lumbar spine MRI, interval partial L2 and complete L3 laminectomies as well as L2-3 interbody fusion. Interval revision posterior fixation, now from L2-S1. There are additional postsurgical changes related to past L4 laminectomy, L3-4 and L4-5 interbody fusions, and L5-S1 anterior interbody fusion.  There is a postsurgical 9.8 x 4.1 cm fluid collection extending from L2-L5 with mild rim enhancement.    3. Retrolisthesis of L2 on L3 has decreased from 5 to 3 mm while grade 2 anterolisthesis of L5 on S1 remains stable at 10 mm.    4. L2-3 spinal canal narrowing has substantially improved with mild residual overall narrowing. However, the previously seen right paracentral-subarticular inferiorly-directed disc extrusion has enlarged from 15 x 6 to 17 x 7 mm (CC x AP; Se 2, Im 8), associated with complete right lateral recess narrowing. Edema within the extruded component compatible with recent progression. Mild left and mild-moderate right foraminal narrowing, improved on both sides from prior lumbar spine MRI.    5. L3-4 spinal canal narrowing has improved with no residual. Mild left foraminal narrowing, slightly improved. Mild-moderate right foraminal narrowing, slightly improved.    6. No significant spinal canal narrowing in the remaining visualized thoracic and lumbar levels. Persistent moderate-severe left and moderate right L4-5 and severe bilateral L5-S1 foraminal narrowing.    7. There is marrow edema in the L2 vertebral body and L3 superior endplate, likely related to stress reaction and degenerative changes. Infectious process felt to be less likely. No definite abnormal intrathecal enhancement, although evaluation is limited by fixation and motion artifacts.    Assessment / Plan   Stephen [name] is a 70 M with PMHx of solitary L kidney, urothelial cancer recently completed chemo, MDD, transurethral resection of the prostate, recent XLIF L2-L3 and revision T10-pelvis on 11/21, who presents for acutely decreased urinary output, and AHRF c/f superimposed PNA on COVID.     # AHRF  # COVID   # C/f superimposed bacterial PNA   Presented with cough and dyspnea, noted to be hypoxemic and placed on 2LNC. Found to be COVID positive; patient had 2 COVID vaccines a couple years ago, no booster vaccines  since. CXR with opacities suggestive of right atelectasis from mucous plugging, and possible PNA. Patient had acutely increased  O2 requirements overnight to 6L; repeat CXR without significant change. Given acute hypoxemia will obtain CTA PE, start on IV abx for PNA coverage, and start COVID treatment. Patient now transitioned to RA.  - PT eval    - Patient refused PT given he can ambulate well   - Per nursing patient has been able to ambulate in the room, with good amb O2 sats (high 90s).  - CTX (12/15 - 12/18) --> augmentin (12/19)  - S/p remdes (12/15 - 12/18)  - S/p dex (12/15 - 12/18)    # Urinary retention  Found to be retaining 300-400cc urine, foley placed. Patient is a poor historian but reports urinary retention began sometime after surgery in November. Urology was consulted in ED and advised that there was no indication for acute urologic intervention. Patient is w/o saddle anesthesia, lower ext numbness/tingling, or bowel incontinence. Has localized tenderness to incision site which is c/d/i.   - Continue foley  - Urology consulted, appreciate recs   - Will arrange outpatient f/u   - Consult ortho, recs appreciated    - MRI L spine not concerning   - Will f/u with ortho outpatient    # L urothelial cancer   - FYI heme/onc in AM    # HTN  - Cont home amlodipine     # Gout  - Cont home allopurinol    # MDD  - Cont home duloxetine  - Cont home pregabalin       Disposition Planning 12/06/2021:  Remaining discharge needs: clinical improvement   Discharge location: Home  Outpatient Follow-up Needed: TBD  Estimated Discharge: 24 hours   (Today)  [place]   VTE prophylaxis: SQ heparin  Diet: Diet Regular  Last BM: 12/03/21 1800   IV fluids: No IV fluids  Access: PIVs  Code status: Full Code      Patient care was discussed in detail with Vicenta Dunning, MD.    Meade Maw  Internal Medicine, PGY-1

## 2021-12-07 ENCOUNTER — Other Ambulatory Visit: Payer: Self-pay

## 2021-12-07 NOTE — Telephone Encounter (Signed)
Population Health Team RN Case  Manager Patient Outreach 2nd Call Attempt    Placed call  to pt. No answer, left message  No calls received after two TCM outreach attempts.    Plan  Close case due to unable to reach pt.

## 2021-12-07 NOTE — Anesthesia Preprocedure Evaluation (Addendum)
ANESTHESIA PRE-OPERATIVE EVALUATION    Patient Information    Name: Stephen [name]    MRN: [id number]    DOB: 09/12/1951    Age: 70 year old    Sex: male  Procedure(s):  Left ureteroscopy, possible biopsy, with laser fulguration of lesion, possible cytology washings, possible insertion of ureteral stent      Pre-op Vitals:   There were no vitals taken for this visit.        Primary language spoken:  English    ROS/Medical History:      History of Present Illness: 70 year old male schedule for Left ureteroscopy, possible biopsy, with laser fulguration of lesion, possible cytology washings, possible insertion of ureteral stent on 12/24/21 with Dr. Bagrodia in Main OR [place]   ADM 12/01/2021-12/06/21 for acute on chronic urinary retention in setting of L2-3 XLIF performed approximately 4 weeks ago. Neuro exam in lower extremities and DRE reassuring for no acute cord compression or cauda equina. No concern for cord compression etiology of urinary retention       Previous Surgeries:  09/06/19 XLIF L3-L5, PSF L3-L5 ([facility])  08/18/20 ALIF L5-S1, revision PSF L3-S1 ([facility])    General:  positive for Obesity,  Patient C/O constipation and abdominal pain at time of call, advised if not resolved to either call his PCP or go to the ED. Also advised to contact Dr. Bagrodia as needed an update on status, he agreed   States he is able to walk with a cane, and can go 1 1/2-2 blocks.    Cardiovascular:  no CAD/Angina/MI/CABG/Stents,   hypertension,  EKG 11/22: SB rate 51    Anesthesia History:  no history of anesthetic complications,  history of difficult intubation,  PONV,  chronic pain patient (Oxycodone as needed ),  Lumbar surgery 11/08/21; Placement Time 1346; Placed By CRNA (specify); Insertion Attempts 1; Laryngoscope View Grade 1; Laryngoscopy Technique Glidescope (size 4); Size (mm) 7.0; Placement Verified By Auscultation, Capnometry; Removal Date 11/08/21      G3V required bougie with DL  [facility] Pulmonary:    recent URI/pneumonia ( States he has Pneumonia while in the hospital ),  States he had pneumonia while in the hospital from 12/01/21-12/06/21 for issues with urinary retention,   states it has resolved, he Currently he does not have a productive cough or fever.     CTA Pulmonary Embolus     Result Date: 12/02/2021  IMPRESSION: 1. No evidence of pulmonary embolism. 2. Multifocal pneumonia. 3. Ectatic ascending aorta. 4. Mild amount of coronary artery calcifications. 5. Few additional findings above.      X-Ray Chest Single View    Result Date: 12/02/2021  IMPRESSION: Increased bibasal opacities of atelectasis/pneumonia with possible right pleural effusion.       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    Infectious Disease:  negative for infectious disease     Renal:  chronic renal disease,  Congenital Hydronephrosis s/p nephrectomy (R) Endocrine/Other:  arthritis,   back pain,  MRI L spine 12/03/2021     IMPRESSION:  1. The last well-formed disc is labeled L5-S1, similar to prior lumbar spine MRI.     2. Since the prior lumbar spine MRI, interval partial L2 and complete L3 laminectomies as well as L2-3   interbody fusion. Interval revision posterior fixation, now from L2-S1. There are additional postsurgical changes related to past L4 laminectomy, L3-4 and L4-5 interbody fusions, and L5-S1 anterior interbody fusion. There is a postsurgical 9.8 x 4.1 cm fluid collection extending from L2-L5 with mild rim enhancement.     3. Retrolisthesis of L2 on L3 has decreased from 5 to 3 mm while grade 2 anterolisthesis of L5 on S1 remains stable at 10 mm.     4. L2-3 spinal canal narrowing has substantially improved with mild residual overall narrowing.  However, the previously seen right paracentral-subarticular inferiorly-directed disc extrusion has enlarged from 15 x 6 to 17 x 7 mm (CC x AP; Se 2, Im 8), associated with complete right lateral recess narrowing. Edema within the extruded component compatible with recent progression. Mild left and mild-moderate right foraminal narrowing, improved on both sides from prior lumbar spine MRI.     5. L3-4 spinal canal narrowing has improved with no residual. Mild left foraminal narrowing, slightly improved. Mild-moderate right foraminal narrowing, slightly improved.     6. No significant spinal canal narrowing in the remaining visualized thoracic and lumbar levels. Persistent moderate-severe left and moderate right L4-5 and severe bilateral L5-S1 foraminal narrowing.     7. There is marrow edema in the L2 vertebral body and L3 superior endplate, likely related to stress reaction and degenerative changes. Infectious process felt to be less likely. No definite abnormal intrathecal enhancement, although evaluation is limited by fixation and motion artifacts.        CT L spine 12/03/2021     IMPRESSION:  Post revision posterior instrumented fusion and interbody fusion of L2-S1 with expected postsurgical changes in the posterior paraspinous soft tissues, better evaluated on contrast-enhanced MRI performed on same date.     No acute fracture or subluxation. No evidence of hardware complication.     No significant spinal canal narrowing. Multilevel neural foraminal narrowing, moderate to severe at L4-L5 and L5-S1; better evaluated on MRI.      Pregnancy History:   Pediatrics:         Pre Anesthesia Testing (PCC/CPC) notes/comments:    PCC Test & records reviewed by PCC Provider.                                   Physical Exam    Airway:    Inter-inciser distance > 4 cm  Prognanth Able    Mallampati: II  Neck ROM: full  TM distance: 5-6 cm  Short thick neck: No          Cardiovascular:  - cardiovascular exam normal          Pulmonary:  - pulmonary exam normal           Neuro/Neck/Skeletal/Skin:  - Neck/Neuro/Skeletal/Skin exam normal          Dental:        Abdominal:   - normal exam         Additional Clinical Notes:               Last  OSA (STOP BANG) Score:    Has a physician diagnosed you with sleep apnea?: No  Do you use a CPAP at home?: No  Do you snore loudly (loud enough to be heard through a closed door)?: No  Do you often feel tired, fatigued or sleepy during the day?: No  Has anyone observed that you stop breathing while you are sleeping?: No  Have you ever   been treated for high blood pressure?: Yes  OSA total score (A score of 2 or more is high risk. Offer patient sleep study.): 1       Past Medical History:   Diagnosis Date    Chronic back pain     Congenital hydronephrosis     Gout     Headache     Hematuria     Kidney disease     Kidney stones     Major depressive disorder, single episode     Polyarthropathy or polyarthritis of multiple sites     Retinal detachment     Urethral stricture      Past Surgical History:   Procedure Laterality Date    CT INSERTION OF SUPRAPUBIC CATH  09/25/2015    NEPHRECTOMY Right 1995    APPENDECTOMY      COLONOSCOPY      CYSTOSCOPY      CYSTOSCOPY W/ LASER LITHOTRIPSY      OTHER SURGICAL HISTORY      Interstim 01/29/2011    SPINE SURGERY  09/21    Lumbar-sacral fusion    TRANSURETHRAL RESECTION OF PROSTATE       Social History     Socioeconomic History    Marital status: Single   Tobacco Use    Smoking status: Never    Smokeless tobacco: Never   Substance and Sexual Activity    Alcohol use: No    Drug use: Not Currently    Sexual activity: Not Currently     Partners: Female   Other Topics Concern    Military Service No    Blood Transfusions Yes    Caffeine Concern No    Occupational Exposure No    Hobby Hazards No    Sleep Concern Yes     Comment: due to my lower back discomfort and leg discomfort    Stress Concern Yes     Comment: Health/ housing/  financial    Weight Concern No    Special Diet No    Back Care Yes     Comment: Am very careful with any activity    Exercises Regularly Yes    Seat Belt Use Yes    Performs Self-Exams Yes    Bike Helmet Use No     Alcohol Use: Not on file       Current Outpatient Medications   Medication Sig Dispense Refill    ALAWAY 0.025 % ophthalmic solution INSTILL 1 DROP INTO BOTH EYES TWICE A DAY AS DIRECTED      albuterol 108 (90 Base) MCG/ACT inhaler Inhale 2 puffs by mouth every 4 hours as needed for Wheezing. 6.7 g 0    allopurinol (ZYLOPRIM) 300 MG tablet Take 300 mg by mouth daily.      amLODIPINE (NORVASC) 5 MG tablet Take 10 mg by mouth daily.      ascorbic acid (VITAMIN C) 500 MG/5ML suspension       benzonatate (TESSALON) 100 MG capsule Take 1 capsule (100 mg) by mouth every 8 hours as needed for Cough. 30 capsule 0    docusate sodium (COLACE) 100 MG capsule Take 1 capsule (100 mg) by mouth 3 times daily for 5 days. 15 capsule 0    DULoxetine (CYMBALTA) 20 MG CR capsule Take 1 capsule (20 mg) by mouth daily. 90 capsule 3    guaiFENesin-dextromethorphan (ROBITUSSIN DM) 100-10 MG/5ML syrup Take 10 mL by mouth every 4 hours as needed for Cough. 560 mL 0  lidocaine (ASPERCREME) 4 % patch Apply 1 patch topically every 24 hours. Leave patch on for 12 hours, then remove for 12 hours. 30 patch 0   • menthol (CEPACOL) 3 MG lozenge Take 1 lozenge by mouth every 2 hours as needed for Pain. 60 lozenge 0   • naloxone (NARCAN) 4 mg/0.1 mL nasal spray For suspected opioid overdose, call 911! Then spray once in one nostril. Repeat after 3 minutes if no or minimal response using a new spray in other nostril. 2 each 0   • ondansetron (ZOFRAN ODT) 8 MG disintegrating tablet Take 1 tablet (8 mg) on or under the tongue every 8 hours as needed for Nausea/Vomiting. 10 tablet 0   • oxyCODONE (ROXICODONE) 5 MG immediate release tablet Take 1 tablet (5 mg) by mouth 2 times daily as needed for Moderate Pain (Pain Score 4-6)  or Severe Pain (Pain Score 7-10). 30 tablet 0   • Polyethyl Glycol-Propyl Glycol (SYSTANE ULTRA OP)      • polyethylene glycol (GLYCOLAX) 17 GM/SCOOP powder Take (1 capful) 17 g by mouth daily. Mix with 4 to 8 ounces of fluid (water, juice, soda, coffee, or tea) prior to administration. 238 g 0   • pregabalin (LYRICA) 100 MG capsule TAKE 1 CAPSULE BY MOUTH EVERYDAY AT BEDTIME 30 capsule 0   • senna (SENOKOT) 8.6 MG tablet Take 1 tablet (8.6 mg) by mouth daily. 30 tablet 0   • triamcinolone (KENALOG) 0.1 % cream Apply 1 Application topically 2 times daily. Apply a thin layer as directed     • VITAMIN D PO Take 125 mg by mouth daily.       No current facility-administered medications for this visit.     Allergies   Allergen Reactions   • Amoxicillin Rash   • Sulfa Drugs Unspecified       Labs and Other Data  Lab Results   Component Value Date    NA 139 12/06/2021    K 3.6 12/06/2021    CL 100 12/06/2021    BICARB 27 12/06/2021    BUN 16 12/06/2021    CREAT 1.00 12/06/2021    GLU 121 (H) 12/06/2021    CA 9.0 12/06/2021     Lab Results   Component Value Date    AST 14 12/02/2021    ALT 7 12/02/2021    ALK 88 12/02/2021    TP 6.4 12/02/2021    ALB 3.5 12/02/2021    TBILI 0.67 12/02/2021     Lab Results   Component Value Date    WBC 7.8 12/06/2021    RBC 3.68 (L) 12/06/2021    HGB 11.2 (L) 12/06/2021    HCT 32.6 (L) 12/06/2021    MCV 88.6 12/06/2021    MCHC 34.4 12/06/2021    RDW 13.2 12/06/2021    PLT 232 12/06/2021    MPV 9.7 12/06/2021    SEG 57 12/06/2021    LYMPHS 31 12/06/2021    MONOS 9 12/06/2021    EOS 0 12/06/2021    BASOS 1 12/06/2021     Lab Results   Component Value Date    INR 1.0 11/04/2021    PTT 24 (L) 11/04/2021     Lab Results   Component Value Date    ARTPH 7.48 (H) 11/08/2021    ARTPO2 213 (H) 11/08/2021    ARTPCO2 32 (L) 11/08/2021     No results found for: A1C    Anesthesia Plan:  Risks and Benefits of   Anesthesia  I have personally performed an appropriate pre-anesthesia physical exam of the patient  (including heart, lungs, and airway) prior to the anesthetic and reviewed the pertinent medical history, drug and allergy history, laboratory and imaging studies and consultations.   I have determined that the patient has had adequate assessment and testing.  I have validated the documentation of these elements of the patient exam and/or have made necessary changes to reflect my own observations during my pre-anesthesia exam.  Anesthetic techniques, invasive monitors, anesthetic drugs for induction, maintenance and post-operative analgesia, risks and alternatives have been explained to the patient and/or patient's representatives.    I have prescribed the anesthetic plan:         Planned anesthesia method: General         ASA 3 (Severe systemic disease)     Potential anesthesia problems identified and risks including but not limited to the following were discussed with patient and/or patient's representative: Adverse or allergic drug reaction, Recall, Ocular injury, Nerve injury and Injury to brain, heart and other organs    No Beta Blocker Indicated: Patient not on beta blockers    Planned monitoring method: Routine monitoring    Informed Consent:  Anesthetic plan and risks discussed with Patient.    Plan discussed with OR Nurse, Surgeon, Attending, Resident and CRNA.

## 2021-12-07 NOTE — Telephone Encounter (Signed)
Population Health Team RN Case Manager Patient Outreach  1st Call Attempt     Notes    First TCM call to follow up post discharge from hospital.  Placed call  to pt.  No answer.  Left message and requested a call back    Plan  Follow up with 2nd attempt phone call if no call received

## 2021-12-08 ENCOUNTER — Other Ambulatory Visit: Payer: Self-pay

## 2021-12-08 ENCOUNTER — Ambulatory Visit (INDEPENDENT_AMBULATORY_CARE_PROVIDER_SITE_OTHER): Payer: Medicare Other | Admitting: Nurse Practitioner

## 2021-12-08 DIAGNOSIS — Z01818 Encounter for other preprocedural examination: Secondary | ICD-10-CM

## 2021-12-08 NOTE — Patient Instructions (Signed)
PREOPERATIVE SURGICAL INFORMATION     Your surgery is currently scheduled at [facility] on 12/24/21  The scheduler will be contacting you with the check in time      [facility], [place], [address] [place], [place] [address]  Please check in at Patient Services in [place] on 1st floor     [facility] (including [facility]): [employer] structure, Microbiologist structure, or Dance movement psychotherapist parking (7am-5pm at [employer]; 5am-5pm at [employer]) for same cost as self-parking in the front entrance of the [facility]   [email or web address]     QUESTIONS    If you have any questions between now and the day of your surgery, please do not hesitate to call:     [facility]: [phone]     DAY OF SURGERY ARRIVAL TIME:    On the day of your Surgery/Procedure, please arrive at the time provided by the surgeon's office. If you have any questions regarding your arrival time, please call the surgeon's office:     Preoperative Surgical Admissions at [facility]:      Medications to hold prior to surgery: none     OK to take the remaining medications as scheduled with a small sip of water on the morning of surgery:     PLEASE HOLD ALL NSAIDS (non-steroidal anti-inflammatory drugs) SUCH AS advil, aleve, motrin, ibuprofen, relafen, lodine, feldene, Diclofenac, voltaren, indomethacin, naproxen, celebrex, Mobic 7 days before surgery.       Please hold vitamins, supplements, CO-Q10, Glucosamine, herbs & fish oil 7 days before surgery. Vitamin C, D are ok to take.    It is OK to take acetaminophen (Tylenol) for pain around the time of surgery unless you have liver disease.      AFTER YOUR VISIT WITH [name], IF YOU START TAKING A NEW MEDICATION BEFORE SURGERY, PLEASE  CALL us TO MAKE SURE IT IS SAFE TO TAKE & WILL NOT AFFECT YOUR SURGERY.          EATING/DRINKING       DO NOT EAT OR DRINK ANYTHING AFTER MIDNIGHT ON THE DAY OF SURGERY    Preparing for your Surgery:     Please wear clean loose-fitting clothes and leave valuables at home   Do not shave or remove body hair. Facial shaving is permitted. If you are having head surgery, ask your doctor whether you can shave.  Bring a picture ID and your insurance card, and be prepared to pay your deductible or co-insurance by cash, check, or credit card when you arrive.   If you are going home after your surgery, please make sure to arrange for an adult to drive you home. You CANNOT use UBER or LYFT. If you do not have a ride, your surgery may be cancelled.    Visitor policy during the QIONG-29 pandemic is subject to change. Current visitor policy can be found at [email or web address]     On The Day of Your Surgery:      Check in at the location mentioned above   COVID testing may be done on arrival to the Pre-op or Procedural areas according to the current [facility] mandate.   If you are a woman of child bearing age, please note  child bearing age, please note that you may be asked to give a urine sample upon check-in  You will meet your anesthesia and surgery teams in the preoperative holding area before surgery.   Once surgery is over, you will wake up in the recovery room.  If you go home, an adult chaperone will need to stay with you for the first 24 hours after surgery.   Visitor policy during the COVID-19 pandemic is subject to change. Current visitor policy can be found at [email or web address]    A video about what to expect for the day of surgery can be found here:    https://www.youtube.com/watch?v=[email or web address]_[email or web address]  Or by searching "You-tube" for [place] before surgery and [place] after surgery     You medical records are available to you at [email or web address]

## 2021-12-09 ENCOUNTER — Ambulatory Visit (HOSPITAL_BASED_OUTPATIENT_CLINIC_OR_DEPARTMENT_OTHER): Payer: Medicare Other

## 2021-12-10 ENCOUNTER — Encounter (HOSPITAL_COMMUNITY): Payer: Self-pay | Admitting: Internal Medicine

## 2021-12-10 DIAGNOSIS — F329 Major depressive disorder, single episode, unspecified: Secondary | ICD-10-CM

## 2021-12-10 DIAGNOSIS — I10 Essential (primary) hypertension: Secondary | ICD-10-CM

## 2021-12-10 DIAGNOSIS — J189 Pneumonia, unspecified organism: Secondary | ICD-10-CM

## 2021-12-10 DIAGNOSIS — C689 Malignant neoplasm of urinary organ, unspecified: Secondary | ICD-10-CM

## 2021-12-10 DIAGNOSIS — R338 Other retention of urine: Secondary | ICD-10-CM

## 2021-12-10 DIAGNOSIS — J9601 Acute respiratory failure with hypoxia: Secondary | ICD-10-CM

## 2021-12-10 DIAGNOSIS — M109 Gout, unspecified: Secondary | ICD-10-CM

## 2021-12-10 DIAGNOSIS — U071 COVID-19: Secondary | ICD-10-CM

## 2021-12-10 HISTORY — DX: Essential (primary) hypertension: I10

## 2021-12-10 LAB — PREPARE FFP/THAWED PLASMA

## 2021-12-10 NOTE — Discharge Summary (Signed)
Date of Admission:  12/01/2021  Date of Discharge:  12/06/2021    Patient Name:  Stephen [name]    Principal Diagnosis (required):  Community acquired pneumonia      Hospital Problem List (required):  Active Hospital Problems    Diagnosis   . *Community acquired pneumonia [J18.9]   . COVID-19 virus infection [U07.1]   . Acute urinary retention [R33.8]   . HTN (hypertension) [I10]   . Gout [M10.9]   . MDD (major depressive disorder) [F32.9]   . 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 Acute hypoxemic respiratory failure (CMS-HCC) [J96.01]       Additional Hospital Diagnoses ("rule out" or "suspected" diagnoses, etc.):       Principal Procedure During This Hospitalization (required):  CT imaging of chest    Other Procedures Performed During This Hospitalization (required):  MRI imaging of L-spine    Procedure results are available in Chart Review in Epic.  For those providers external to [facility], the key procedure results are listed below:  X-Ray Knee 1 Or 2 Views - Right    Result Date: 12/01/2021  IMPRESSION: Minimal tricompartmental degeneration of the knee joint.    [name] Lower Extremity Venous Duplex Bilateral    Result Date: 12/03/2021  IMPRESSION: No evidence of deep venous thrombosis in the examined veins of the bilateral lower extremities.     CTA Pulmonary Embolus    Result Date: 12/02/2021  IMPRESSION: 1. No evidence of pulmonary embolism. 2. Multifocal pneumonia. 3. Ectatic ascending aorta. 4. Mild amount of coronary artery calcifications. 5. Few additional findings above.     X-Ray Lumbosacral Spine 2 Or 3 Views    Result Date: 11/23/2021  IMPRESSION: No complications at recent revision spinal fusion, with combined interbody and posterior instrumented fixation from L2 through S1.    CT L-Spine W/O IV Contrast    Result Date: 12/03/2021  IMPRESSION: Post revision posterior instrumented fusion and interbody fusion of L2-S1 with expected postsurgical changes in the  posterior paraspinous soft tissues, better evaluated on contrast-enhanced MRI performed on same date. No acute fracture or subluxation. No evidence of hardware complication. No significant spinal canal narrowing. Multilevel neural foraminal narrowing, moderate to severe at L4-L5 and L5-S1; better evaluated on MRI.     MRI Lumbar Spine W/WO IV Contrast    Result Date: 12/03/2021  IMPRESSION: 1. The last well-formed disc is labeled L5-S1, similar to prior lumbar spine MRI. 2. Since the prior lumbar spine MRI, interval partial L2 and complete L3 laminectomies as well as L2-3 interbody fusion. Interval revision posterior fixation, now from L2-S1. There are additional postsurgical changes related to past L4 laminectomy, L3-4 and L4-5 interbody fusions, and L5-S1 anterior interbody fusion. There is a postsurgical 9.8 x 4.1 cm fluid collection extending from L2-L5 with mild rim enhancement. 3. Retrolisthesis of L2 on L3 has decreased from 5 to 3 mm while grade 2 anterolisthesis of L5 on S1 remains stable at 10 mm. 4. L2-3 spinal canal narrowing has substantially improved with mild residual overall narrowing. However, the previously seen right paracentral-subarticular inferiorly-directed disc extrusion has enlarged from 15 x 6 to 17 x 7 mm (CC x AP; Se 2, Im 8), associated with complete right lateral recess narrowing. Edema within the extruded component compatible with recent progression. Mild left and mild-moderate right foraminal narrowing, improved on both sides from prior lumbar spine MRI. 5. L3-4 spinal canal narrowing has improved with no  residual. Mild left foraminal narrowing, slightly improved. Mild-moderate right foraminal narrowing, slightly improved. 6. No significant spinal canal narrowing in the remaining visualized thoracic and lumbar levels. Persistent moderate-severe left and moderate right L4-5 and severe bilateral L5-S1 foraminal narrowing. 7. There is marrow edema in the L2 vertebral body and L3 superior  endplate, likely related to stress reaction and degenerative changes. Infectious process felt to be less likely. No definite abnormal intrathecal enhancement, although evaluation is limited by fixation and motion artifacts.     US Kidney Complete    Result Date: 12/03/2021  IMPRESSION: Persistent stable mild-to-moderate hydronephrosis, which is overall similar compared to MR 10/25/2021. No obstructing stones visualized.       Consultations Obtained During This Hospitalization:  Orthopedic Surgery - Spine  Urology    Key consultant recommendations:  Orthopedic Surgery - Spine Urology   Assessment:  Stephen [name] is a 70 year old male who presents with acute on chronic urinary retention in setting of L2-3 XLIF performed approximately 4 weeks ago. Neuro exam in lower extremities and DRE reassuring for no acute cord compression or cauda equina. No concern for cord compression etiology of urinary retention. Recommend continued evaluation by urology for obstructive etiologies. Plan for follow up in Orthopedic Spine clinic in 2 weeks.     Plan/Recs:  - Treatment Plan: Continue post-op course as planned  - Activity: WBAT BLE  - Immobilization: None  - VTE ppx: Per primary team    - Antibiotics: Per primary team  - Pain control: multimodal pain control with PO meds, minimize IV   - Imaging: no further imaging workup required at this time.  - Diet: Per primary team    Dispo/Follow up plan: Plan for follow up in Ortho Spine clinic in 2 weeks. Assessment:  Stephen [name] is a 70 year old male with hx solitary left kidney, UTUC s/p Jelmyto instillations 08/06/21-09/09/21 presenting with acute urinary retention and fatigue.    No e/o UTI or new obstruction. Stable hydronephrosis.    Recommendations:  - No indication for acute urologic intervention.  - Maintain FC x1 week, urology to arrange TOV         Reason for Admission to the Hospital / History of Present Illness:  Stephen [name] is a 70 year old male with PMhx of  solitary kideny, MDD, Urothelial [place] on recently completed chemotherapy, and recent XLIF & PSIL on 11/21 severe cord compression for  who presented for decreased urinary output    Thinks that the cough had been going on about 1wk. Worse over the past couple of days. Low energy. Denies fevers. Feeling constipated. Endorses nausea and poor appetite. Bothered by sore throat.hadn't noted any other changes    Overall felt that he was doing well after the surgery.     In the ED, HR 89, BP 115/55, RR 18, afebrile. Bladder scan demonstrated ~400cc retained urine and a foley was placed. It was noted that he was hypoxic and COVID + and he was admitted for further [facility] Course by Problem (required):  # Acute hypoxemic respiratory failure, resolved  # COVID infection  # Superimposed CAP  Presented with cough and dyspnea, noted to be hypoxemic and placed on 2LNC. Found to be COVID positive; patient had 2 COVID vaccines a couple years ago, no booster vaccines since. CXR with opacities suggestive of right atelectasis from mucous plugging, and possible PNA. Patient had acutely increased O2 requirements overnight to 6L, so a CTPE  was obtained and negative. The patient was treated with ceftriaxone for 4 days and discharged on amox-clav to finish 5 day course. He also was treated with remdesivir and dexamethasone while hypoxemic (12/15 - 12/18). He improved appropriately on this regimen and was discharged.   - Augmentin to finish 5-day total course of antibiotics  - Albuterol inhaler PRN  - Benzonatate and guaifenesin PRN    # Urinary retention  # s/p L2-3 XLIF  Found to be retaining 300-400cc urine requiring Foley placement. Unclear chronicity but reports urinary retention began sometime after L2-3 XLIF approximately 4 weeks prior to admission. Obtained an MRI and ortho was consulted with no concern for cord compression. Urology was consulted in ED and advised that there was no indication for acute urologic  intervention.   - Maintain Foley catheter on discharge  - Follow up in Urology clinic  - Follow up in Ortho Spine clinic in 2 weeks    # HTN: Cont home amlodipine   # Gout: Cont home allopurinol  # MDD: Cont home duloxetine and pregabalin  # L urothelial cancer: Cont urology follow up    Tests Outstanding at Discharge Requiring Follow Up:  There are no tests or other items that require follow up after hospital discharge.      Discharge Condition (required):  Improved.    Key Physical Exam Findings at Discharge:  Mental Status Exam: Patient is alert and oriented to person, place, time, and situation.  No significant physical examination findings at the time of discharge.    Discharge Diet:  Low-salt.    Discharge Medications:     What To Do With Your Medications      START taking these medications      Add'l Info   benzonatate 100 MG capsule  Commonly known as: TESSALON  Take 1 capsule (100 mg) by mouth every 8 hours as needed for Cough.   Quantity: 30 capsule  Refills: 0     guaiFENesin-dextromethorphan 100-10 MG/5ML syrup  Commonly known as: ROBITUSSIN DM  Take 10 mL by mouth every 4 hours as needed for Cough.   Quantity: 560 mL  Refills: 0     lidocaine 4 % patch  Commonly known as: ASPERCREME  Apply 1 patch topically every 24 hours. Leave patch on for 12 hours, then remove for 12 hours.   Quantity: 30 patch  Refills: 0     menthol 3 MG lozenge  Commonly known as: CEPACOL  Take 1 lozenge by mouth every 2 hours as needed for Pain.   Quantity: 60 lozenge  Refills: 0     ondansetron 8 MG disintegrating tablet  Commonly known as: ZOFRAN ODT  Take 1 tablet (8 mg) on or under the tongue every 8 hours as needed for Nausea/Vomiting.   Quantity: 10 tablet  Refills: 0     polyethylene glycol 17 GM/SCOOP powder  Commonly known as: GLYCOLAX  Take (1 capful) 17 g by mouth daily. Mix with 4 to 8 ounces of fluid (water, juice, soda, coffee, or tea) prior to administration.   Quantity: 238 g  Refills: 0     Proventil HFA 108 (90  Base) MCG/ACT inhaler  Inhale 2 puffs by mouth every 4 hours as needed for Wheezing.  Generic drug: albuterol   Quantity: 6.7 g  Refills: 0        CONTINUE taking these medications      Add'l Info   [place] 0.025 % ophthalmic solution  INSTILL 1 DROP INTO BOTH  EYES TWICE A DAY AS DIRECTED  Generic drug: ketotifen   Refills: 0     allopurinol 300 MG tablet  Commonly known as: ZYLOPRIM  Take 300 mg by mouth daily.   Refills: 0     amLODIPINE 5 MG tablet  Commonly known as: NORVASC  Take 10 mg by mouth daily.   Refills: 0     ascorbic acid 500 MG/5ML suspension  Commonly known as: VITAMIN C   Refills: 0     docusate sodium 100 MG capsule  Commonly known as: COLACE  Take 1 capsule (100 mg) by mouth 3 times daily for 5 days.   Quantity: 15 capsule  Refills: 0     DULoxetine 20 MG CR capsule  Commonly known as: CYMBALTA  Take 1 capsule (20 mg) by mouth daily.   Quantity: 90 capsule  Refills: 3     naloxone 4 mg/0.1 mL nasal spray  Commonly known as: NARCAN  For suspected opioid overdose, call 911! Then spray once in one nostril. Repeat after 3 minutes if no or minimal response using a new spray in other nostril.   Quantity: 2 each  Refills: 0     oxyCODONE 5 MG immediate release tablet  Commonly known as: ROXICODONE  Take 1 tablet (5 mg) by mouth 2 times daily as needed for Moderate Pain (Pain Score 4-6) or Severe Pain (Pain Score 7-10).   Quantity: 30 tablet  Refills: 0     pregabalin 100 MG capsule  Commonly known as: LYRICA  TAKE 1 CAPSULE BY MOUTH EVERYDAY AT BEDTIME   Quantity: 30 capsule  Refills: 0     senna 8.6 MG tablet  Commonly known as: SENOKOT  Take 1 tablet (8.6 mg) by mouth daily.   Quantity: 30 tablet  Refills: 0     SYSTANE ULTRA OP   Refills: 0     triamcinolone 0.1 % cream  Commonly known as: KENALOG  Apply 1 Application topically 2 times daily. Apply a thin layer as directed   Refills: 0     VITAMIN D PO  Take 125 mg by mouth daily.   Refills: 0        ASK your doctor about these medications      Add'l  Info   amoxicillin-clavulanate 875-125 MG tablet  Commonly known as: AUGMENTIN  Take 1 tablet by mouth every 12 hours for 2 doses Indications: Pneumonia.  Ask about: Should I take this medication?   Quantity: 2 tablet  Refills: 0           Where to Get Your Medications      These medications were sent to [id number] [place] [place]  [address] [place] [address]    Hours: Mon-Fri 8am-9pm, Sat [place] 10am-6pm Phone: [phone]    amoxicillin-clavulanate 875-125 MG tablet   benzonatate 100 MG capsule   guaiFENesin-dextromethorphan 100-10 MG/5ML syrup   lidocaine 4 % patch   menthol 3 MG lozenge   ondansetron 8 MG disintegrating tablet     These medications were sent to [facility]  [address], [place] [address]    Hours: Mon-Fri: 8:30am-7:00pm; Sat-Sun & Holidays: 9:00am-5:00pm Phone: [phone]    Proventil HFA 108 (90 Base) MCG/ACT inhaler     These medications were sent to [facility]  [id number], [place] [address]    Hours: Mon-Fri: 8:30am-7:00pm; Sat-Sun &  Holidays: 9:00am-5:00pm Phone: [phone]    polyethylene glycol 17 GM/SCOOP powder         Allergies:  Allergies   Allergen Reactions   . Amoxicillin Rash   . Sulfa Drugs Unspecified       Discharge Disposition:  Home.        Discharge Code Status:  Full code / full care  This code status is not changed from the time of admission.    Follow Up Appointments:    Already Scheduled:  Future Appointments   Date Time Provider [place]   12/16/2021  9:00 AM [facility]   04/15/2022  9:00 AM Arlana Lindau, MD [facility]       PostDischarge Referrals and Orders     Consult/Referral to Urology      Orthopedics Clinic          Certain appointment types may not appear in this section. Refer to the Post Discharge Referrals section of the After Visit Summary for further information.    Discharging  14 Contact Information:  [facility] phone triage at [phone].

## 2021-12-13 ENCOUNTER — Encounter (HOSPITAL_BASED_OUTPATIENT_CLINIC_OR_DEPARTMENT_OTHER): Payer: Self-pay | Admitting: Urology

## 2021-12-14 ENCOUNTER — Telehealth (HOSPITAL_BASED_OUTPATIENT_CLINIC_OR_DEPARTMENT_OTHER): Payer: Self-pay | Admitting: Urology

## 2021-12-14 DIAGNOSIS — R339 Retention of urine, unspecified: Secondary | ICD-10-CM

## 2021-12-14 NOTE — Telephone Encounter (Signed)
Pt is requesting if he is able to change his catheter instead of taking it out on his nurse visit appt. States pt would like to keep his catheter until 01/06. Please assist, thank you.        [phone]

## 2021-12-14 NOTE — Telephone Encounter (Signed)
RN called patient. No response. LV to return call.

## 2021-12-14 NOTE — Telephone Encounter (Signed)
Pt is returning a call back to speak with an RN , please advise pt thank you, [phone]

## 2021-12-14 NOTE — Telephone Encounter (Signed)
RN called patient using 2 ID's. Patient states he is scheduled to see NV on 12/29 to remove the catheter. Pt is asking cath can be changed instead. Would like to have catheter until his surgery on 12/24/21.  Patient states he is afraid he will go into retention over the holidays.  Will route message to provide and team

## 2021-12-14 NOTE — Telephone Encounter (Signed)
Faxed 12/22    Matter resolved

## 2021-12-15 NOTE — Addendum Note (Signed)
Addended by: Artis Delay on: 12/15/2021 10:00 AM     Modules accepted: Orders

## 2021-12-15 NOTE — Telephone Encounter (Signed)
Discussed with Dr. Sallyanne Kuster.  States OK to do cath change.  Also, need to collect/send out urine for culture and he would like patient to start on Flomax 0.4mg  daily.  Orders entered.

## 2021-12-16 ENCOUNTER — Telehealth (HOSPITAL_BASED_OUTPATIENT_CLINIC_OR_DEPARTMENT_OTHER): Payer: Self-pay | Admitting: Urology

## 2021-12-16 ENCOUNTER — Ambulatory Visit: Payer: Medicare Other | Attending: Urology

## 2021-12-16 DIAGNOSIS — R339 Retention of urine, unspecified: Secondary | ICD-10-CM | POA: Insufficient documentation

## 2021-12-16 MED ORDER — LIDOCAINE HCL 2% EX GEL (UROJET)
10.0000 mL | Freq: Every day | Status: DC | PRN
Start: 2021-12-16 — End: 2022-01-19
  Administered 2021-12-16 (×2): 10 mL via URETHRAL

## 2021-12-16 MED ORDER — LIDOCAINE HCL 2% EX GEL (UROJET)
Status: AC
Start: 2021-12-16 — End: ?
  Filled 2021-12-16: qty 10

## 2021-12-16 NOTE — Telephone Encounter (Signed)
Please sign pended order for lidocaine. Thanks

## 2021-12-16 NOTE — Interdisciplinary (Signed)
History:  70 year old patient  who is managed with indwelling urethral catheter here for catheter change. Procedure explained to patient and agreed.     Denies problems with UTIs, incontinence or difficulty draining.       There has been no interval change in the patient's history as outlined, reviewed and updated in the electronic medical record.  ROS as indicated in electronic record, all other systems negative.     Exam:    Catheter site appears clean and intact. Urine appears normal.       8 mL sterile water aspirated from catheter balloon port.  [place] catheter removed without problems. Tip intact.  Cleansed urethra with betadine using sterile technique  16 [place] catheter inserted without problems.  Lidocaine 2% (urojet) administered into the urethra per patient request.  10 mL sterile water instilled to catheter balloon port.    Irrigated catheter with 90 ml sterile water with 90 ml return.   Patient tolerated procedure well.     Assessment:  Urine sample collected and sent to lab room.     Plan:   1.ED precautions discussed.  2.Maintain adequate hydration.  3.Patient given appropriate instructions for home care of catheter, bag and tubing    Patient discharged in stable condition.

## 2021-12-16 NOTE — Patient Instructions (Signed)
CATHETER CARE  While at home, feel free to use the large bag or the leg bag during the day. Most patients find it most convenient to use the large bag at night. Drink 4-6 glasses of water in a 24-hour period.  This helps keep your urine clear.  It is normal for your urine to be pink-tinged during the next 2 weeks, especially with walking and bowel movements.  Increasing fluids will usually make the urine clear again.  If your catheter is not draining, make sure that it is not kinked. If there are no kinks and the urine is still not flowing, please call me immediately.  You may notice a pink colored mucus type discharge at the tip of your penis.  This is normal.  You can use a warm soapy washcloth to clean the area and then apply antibiotic ointment such as bacitracin or Neosporin.       Leaking around the catheter - When you are up walking around, you may have leakage around the catheter.  This can usually be managed through the use of pads or other absorbent materials. If you are concerned about the catheter not draining properly, please call the Urology Nurse (please see nurse lines below):        Urinary Tract Infection - Urinary tract infections may sometimes occur. These are usually not serious. They may manifest in several different ways. Before the catheter is removed, the urine may become permanently cloudy, and/or you may develop some painful white drainage around the catheter. After the catheter is removed, you may have burning with urination, blood in the urine, increased frequency of urination, lower abdominal pain, and strong urges to urinate. Please call the Urology Nurse to discuss your symptoms.    

## 2021-12-18 LAB — URINE CULTURE: Urine Culture Result: NO GROWTH

## 2021-12-21 ENCOUNTER — Telehealth (HOSPITAL_BASED_OUTPATIENT_CLINIC_OR_DEPARTMENT_OTHER): Payer: Self-pay

## 2021-12-21 NOTE — H&P (Signed)
Urology History & Physical     Chief Complaint:     HPI:     71 year old male with left upper tract disease here for follow up URS after Jelmyto course    NPO status:> 8 hours  Blood thinners: denies  No recent fever/chills, flu like sxs, cough, dysuria, nausea/vomiting    Past Medical History:  Past Medical History:   Diagnosis Date   . Chronic back pain    . Congenital hydronephrosis    . Gout    . Headache    . Hematuria    . HTN (hypertension) 12/10/2021   . Kidney disease    . Kidney stones    . Major depressive disorder, single episode    . Polyarthropathy or polyarthritis of multiple sites    . Retinal detachment    . Urethral stricture        Past Surgical History:  Past Surgical History:   Procedure Laterality Date   . CT INSERTION OF SUPRAPUBIC CATH  09/25/2015   . NEPHRECTOMY Right 1995   . APPENDECTOMY     . COLONOSCOPY     . CYSTOSCOPY     . CYSTOSCOPY W/ LASER LITHOTRIPSY     . OTHER SURGICAL HISTORY      Interstim 01/29/2011   . SPINE SURGERY  09/21    Lumbar-sacral fusion   . TRANSURETHRAL RESECTION OF PROSTATE         Medications:  Facility-Administered Medications Prior to Admission   Medication Dose Route Frequency Provider Last Rate Last Admin   . lidocaine (UROJET) 2 % topical jelly 10 mL  10 mL Urethral Daily PRN Carlota Raspberry, MD   10 mL at 12/16/21 1133     Medications Prior to Admission   Medication Sig Dispense Refill Last Dose   . ALAWAY 0.025 % ophthalmic solution INSTILL 1 DROP INTO BOTH EYES TWICE A DAY AS DIRECTED   12/23/2021   . albuterol 108 (90 Base) MCG/ACT inhaler Inhale 2 puffs by mouth every 4 hours as needed for Wheezing. 6.7 g 0 Past Month   . allopurinol (ZYLOPRIM) 300 MG tablet Take 300 mg by mouth daily.   12/23/2021   . amLODIPINE (NORVASC) 5 MG tablet Take 10 mg by mouth daily.   12/23/2021   . ascorbic acid (VITAMIN C) 500 MG/5ML suspension    12/23/2021   . benzonatate (TESSALON) 100 MG capsule Take 1 capsule (100 mg) by mouth every 8 hours as needed for Cough. 30 capsule 0  Past Week   . docusate sodium (COLACE) 100 MG capsule Take 1 capsule (100 mg) by mouth 3 times daily for 5 days. 15 capsule 0    . DULoxetine (CYMBALTA) 20 MG CR capsule Take 1 capsule (20 mg) by mouth daily. 90 capsule 3 12/23/2021   . guaiFENesin-dextromethorphan (ROBITUSSIN DM) 100-10 MG/5ML syrup Take 10 mL by mouth every 4 hours as needed for Cough. 560 mL 0    . lidocaine (ASPERCREME) 4 % patch Apply 1 patch topically every 24 hours. Leave patch on for 12 hours, then remove for 12 hours. 30 patch 0    . menthol (CEPACOL) 3 MG lozenge Take 1 lozenge by mouth every 2 hours as needed for Pain. 60 lozenge 0    . naloxone (NARCAN) 4 mg/0.1 mL nasal spray For suspected opioid overdose, call 911! Then spray once in one nostril. Repeat after 3 minutes if no or minimal response using a new spray in other nostril. 2 each  0    . ondansetron (ZOFRAN ODT) 8 MG disintegrating tablet Take 1 tablet (8 mg) on or under the tongue every 8 hours as needed for Nausea/Vomiting. 10 tablet 0    . oxyCODONE (ROXICODONE) 5 MG immediate release tablet Take 1 tablet (5 mg) by mouth 2 times daily as needed for Moderate Pain (Pain Score 4-6) or Severe Pain (Pain Score 7-10). 30 tablet 0    . Polyethyl Glycol-Propyl Glycol (SYSTANE ULTRA OP)       . polyethylene glycol (GLYCOLAX) 17 GM/SCOOP powder Take (1 capful) 17 g by mouth daily. Mix with 4 to 8 ounces of fluid (water, juice, soda, coffee, or tea) prior to administration. 238 g 0    . pregabalin (LYRICA) 100 MG capsule TAKE 1 CAPSULE BY MOUTH EVERYDAY AT BEDTIME 30 capsule 0 12/23/2021   . senna (SENOKOT) 8.6 MG tablet Take 1 tablet (8.6 mg) by mouth daily. 30 tablet 0    . triamcinolone (KENALOG) 0.1 % cream Apply 1 Application topically 2 times daily. Apply a thin layer as directed      . VITAMIN D PO Take 125 mg by mouth daily.            Current Facility-Administered Medications:   .  lactated ringers infusion, , IntraVENOUS, Continuous, Carlota Raspberry, MD, Last Rate: 30 mL/hr at  12/24/21 0835, New Bag at 12/24/21 0835  .  tamsulosin (FLOMAX) capsule 0.4 mg, 0.4 mg, Oral, Once, Eilene Ghazi, MD    Allergies:  Allergies   Allergen Reactions   . Amoxicillin Rash   . Sulfa Drugs Unspecified       Social History:  unchanged    Family History:  Family History   Adopted: Yes   Family history unknown: Yes       Review of Systems:  Reviewed and negative except as per HPI    Objective:  BP 119/70 (BP Location: Right arm, BP Patient Position: Sitting)   Pulse 58   Temp 96.8 F (36 C)   Resp 14   SpO2 97%     Exam  Gen: NAD, appears comfortable  Neuro: A&O x3  Lungs: Normal work of breathing on room air  Cardiac: regular rate  Abd: Soft, non tender, non distended  GU:no SPT  Ext: wwp    Imaging:   Reviewed MRU    Assessment/Plan:  71 year old male with solitary left kidney, left UTUC s/p laser ablation and Jelmyto, here for follow up URS  PMHx: Urothelial cance (HG Ta bladder, HG L UTUC), HTN, obesity, chronic back pain, GERD,   PSURGx: XLIF L2-L3,   Meds:  All: amoxicillin, sulfa  Labs: UCx negative, Cr 1.00/GFR >60, hgb 11.2  Imaging: 10/2021 MRU with mild hydro, no filling defect, nothing in bladder    Plan:  -flomax now  -urine cytology  -cysto  -L URS (solitary), possible laser ablation  -Ancef    Andris Flurry, M.D.   Dept. Of Urology PGY5

## 2021-12-21 NOTE — Telephone Encounter (Addendum)
[place] / Medical Oncology Social Work Telephone note:    Stephen [name] is known to me from several prior interventions.  He left a voicemail, stating that he is having a procedure on 12/24/21 (urine cytology  -cysto  -L URS (solitary), possible laser ablation  -Ancef),   and he would like to talk with SW for supportive intervention.    Today, I spoke with Stephen [name] for 15 minutes.  He said he had a quiet holiday.  He's preparing for his procedure.  He hopes his dental problems do not preclude him from his surgery.    He said he's having significant dental problems and cannot afford the anesthesia for his dental work.  I provided a link for new dental clinic, [email or web address]   He will also contact [facility].  I encouraged him to f/u.     We agreed to meet to briefly touch base when he follows up with Dr. Sallyanne Kuster at his next visit.     [name], [place] Social Worker  [phone]

## 2021-12-23 ENCOUNTER — Other Ambulatory Visit (HOSPITAL_BASED_OUTPATIENT_CLINIC_OR_DEPARTMENT_OTHER): Payer: Self-pay | Admitting: Urology

## 2021-12-23 ENCOUNTER — Encounter (HOSPITAL_BASED_OUTPATIENT_CLINIC_OR_DEPARTMENT_OTHER): Payer: Self-pay | Admitting: Orthopaedic Surgery of the Spine

## 2021-12-23 ENCOUNTER — Encounter: Payer: Self-pay | Admitting: Hospital

## 2021-12-23 DIAGNOSIS — R52 Pain, unspecified: Secondary | ICD-10-CM

## 2021-12-24 ENCOUNTER — Ambulatory Visit (HOSPITAL_BASED_OUTPATIENT_CLINIC_OR_DEPARTMENT_OTHER): Payer: Medicare Other | Admitting: Nurse Practitioner

## 2021-12-24 ENCOUNTER — Telehealth (HOSPITAL_BASED_OUTPATIENT_CLINIC_OR_DEPARTMENT_OTHER): Payer: Self-pay | Admitting: Orthopaedic Surgery of the Spine

## 2021-12-24 ENCOUNTER — Ambulatory Visit (HOSPITAL_COMMUNITY): Payer: Medicare Other | Admitting: Nurse Practitioner

## 2021-12-24 ENCOUNTER — Other Ambulatory Visit: Payer: Self-pay

## 2021-12-24 ENCOUNTER — Encounter (HOSPITAL_COMMUNITY): Admission: RE | Disposition: A | Discharge status: Home Health | Payer: Self-pay | Attending: Urology

## 2021-12-24 ENCOUNTER — Ambulatory Visit
Admission: RE | Admit: 2021-12-24 | Discharge: 2021-12-24 | Disposition: A | Discharge status: Home / Self Care | Payer: Medicare Other | Attending: Urology | Admitting: Urology

## 2021-12-24 ENCOUNTER — Ambulatory Visit (HOSPITAL_BASED_OUTPATIENT_CLINIC_OR_DEPARTMENT_OTHER): Payer: Medicare Other

## 2021-12-24 DIAGNOSIS — N189 Chronic kidney disease, unspecified: Secondary | ICD-10-CM

## 2021-12-24 DIAGNOSIS — C669 Malignant neoplasm of unspecified ureter: Secondary | ICD-10-CM

## 2021-12-24 DIAGNOSIS — C662 Malignant neoplasm of left ureter: Secondary | ICD-10-CM

## 2021-12-24 DIAGNOSIS — C689 Malignant neoplasm of urinary organ, unspecified: Secondary | ICD-10-CM | POA: Insufficient documentation

## 2021-12-24 DIAGNOSIS — K219 Gastro-esophageal reflux disease without esophagitis: Secondary | ICD-10-CM | POA: Insufficient documentation

## 2021-12-24 DIAGNOSIS — G8929 Other chronic pain: Secondary | ICD-10-CM

## 2021-12-24 DIAGNOSIS — Z9221 Personal history of antineoplastic chemotherapy: Secondary | ICD-10-CM

## 2021-12-24 DIAGNOSIS — E669 Obesity, unspecified: Secondary | ICD-10-CM

## 2021-12-24 DIAGNOSIS — Z882 Allergy status to sulfonamides status: Secondary | ICD-10-CM | POA: Insufficient documentation

## 2021-12-24 DIAGNOSIS — F419 Anxiety disorder, unspecified: Secondary | ICD-10-CM

## 2021-12-24 DIAGNOSIS — F32A Depression, unspecified: Secondary | ICD-10-CM

## 2021-12-24 DIAGNOSIS — R8290 Unspecified abnormal findings in urine: Secondary | ICD-10-CM

## 2021-12-24 DIAGNOSIS — I10 Essential (primary) hypertension: Secondary | ICD-10-CM

## 2021-12-24 DIAGNOSIS — D4959 Neoplasm of unspecified behavior of other genitourinary organ: Secondary | ICD-10-CM

## 2021-12-24 DIAGNOSIS — Z9049 Acquired absence of other specified parts of digestive tract: Secondary | ICD-10-CM | POA: Insufficient documentation

## 2021-12-24 DIAGNOSIS — Z87442 Personal history of urinary calculi: Secondary | ICD-10-CM | POA: Insufficient documentation

## 2021-12-24 DIAGNOSIS — Z880 Allergy status to penicillin: Secondary | ICD-10-CM | POA: Insufficient documentation

## 2021-12-24 DIAGNOSIS — Z905 Acquired absence of kidney: Secondary | ICD-10-CM

## 2021-12-24 DIAGNOSIS — R52 Pain, unspecified: Secondary | ICD-10-CM

## 2021-12-24 DIAGNOSIS — R338 Other retention of urine: Secondary | ICD-10-CM

## 2021-12-24 DIAGNOSIS — N200 Calculus of kidney: Secondary | ICD-10-CM | POA: Insufficient documentation

## 2021-12-24 DIAGNOSIS — D494 Neoplasm of unspecified behavior of bladder: Secondary | ICD-10-CM

## 2021-12-24 DIAGNOSIS — M62838 Other muscle spasm: Secondary | ICD-10-CM

## 2021-12-24 DIAGNOSIS — Z79899 Other long term (current) drug therapy: Secondary | ICD-10-CM

## 2021-12-24 DIAGNOSIS — M109 Gout, unspecified: Secondary | ICD-10-CM | POA: Insufficient documentation

## 2021-12-24 DIAGNOSIS — Q620 Congenital hydronephrosis: Secondary | ICD-10-CM | POA: Insufficient documentation

## 2021-12-24 SURGERY — URETEROSCOPY, NON-STONE
Anesthesia: General | Site: Ureter | Laterality: Left | Wound class: Class II (Clean Contaminated)

## 2021-12-24 MED ORDER — STERILE WATER FOR INJECTION IJ SOLN
1000.0000 mg | Freq: Once | INTRAVENOUS | Status: AC
Start: 2021-12-24 — End: 2021-12-24
  Administered 2021-12-24 (×2): 1000 mg via INTRAVESICAL
  Filled 2021-12-24: qty 1000

## 2021-12-24 MED ORDER — TIZANIDINE HCL 2 MG OR TABS
2.0000 mg | ORAL_TABLET | Freq: Three times a day (TID) | ORAL | 0 refills | Status: DC | PRN
Start: 2021-12-24 — End: 2022-02-15

## 2021-12-24 MED ORDER — DEXAMETHASONE SODIUM PHOSPHATE 4 MG/ML IJ SOLN (CUSTOM)
INTRAMUSCULAR | Status: DC | PRN
Start: 2021-12-24 — End: 2021-12-24
  Administered 2021-12-24 (×2): 4 mg via INTRAVENOUS

## 2021-12-24 MED ORDER — TAMSULOSIN HCL 0.4 MG PO CAPS
0.4000 mg | ORAL_CAPSULE | Freq: Every day | ORAL | 0 refills | Status: DC
Start: 2021-12-24 — End: 2021-12-30
  Filled 2021-12-24: qty 7, 7d supply, fill #0

## 2021-12-24 MED ORDER — POLYETHYLENE GLYCOL 3350 OR POWD
17.0000 g | Freq: Every day | ORAL | 0 refills | Status: DC
Start: 2021-12-24 — End: 2022-12-01
  Filled 2021-12-24: qty 238, 14d supply, fill #0

## 2021-12-24 MED ORDER — DIPHENHYDRAMINE HCL 50 MG/ML IJ SOLN
12.5000 mg | Freq: Once | INTRAMUSCULAR | Status: DC | PRN
Start: 2021-12-24 — End: 2021-12-24

## 2021-12-24 MED ORDER — CEFAZOLIN SODIUM 1 GM IJ SOLR
INTRAMUSCULAR | Status: DC | PRN
Start: 2021-12-24 — End: 2021-12-24
  Administered 2021-12-24 (×2): 2000 mg via INTRAVENOUS

## 2021-12-24 MED ORDER — LACTATED RINGERS IV SOLN
INTRAVENOUS | Status: DC | PRN
Start: 2021-12-24 — End: 2021-12-24

## 2021-12-24 MED ORDER — FENTANYL CITRATE (PF) 100 MCG/2ML IJ SOLN
INTRAMUSCULAR | Status: AC
Start: 2021-12-24 — End: ?
  Filled 2021-12-24: qty 2

## 2021-12-24 MED ORDER — ONDANSETRON HCL 4 MG/2ML IV SOLN
INTRAMUSCULAR | Status: DC | PRN
Start: 2021-12-24 — End: 2021-12-24
  Administered 2021-12-24 (×2): 4 mg via INTRAVENOUS

## 2021-12-24 MED ORDER — MEPERIDINE HCL 25 MG/ML IJ SOLN
12.5000 mg | INTRAMUSCULAR | Status: DC | PRN
Start: 2021-12-24 — End: 2021-12-24

## 2021-12-24 MED ORDER — TAMSULOSIN HCL 0.4 MG PO CAPS
0.4000 mg | ORAL_CAPSULE | Freq: Once | ORAL | Status: AC
Start: 2021-12-24 — End: 2021-12-24
  Administered 2021-12-24 (×2): 0.4 mg via ORAL
  Filled 2021-12-24: qty 1

## 2021-12-24 MED ORDER — ACETAMINOPHEN 325 MG PO TABS
650.0000 mg | ORAL_TABLET | Freq: Four times a day (QID) | ORAL | 0 refills | Status: DC | PRN
Start: 2021-12-24 — End: 2022-06-01
  Filled 2021-12-24: qty 30, 4d supply, fill #0

## 2021-12-24 MED ORDER — PROPOFOL 200 MG/20ML IV EMUL
INTRAVENOUS | Status: DC | PRN
Start: 2021-12-24 — End: 2021-12-24
  Administered 2021-12-24 (×2): 130 mg via INTRAVENOUS

## 2021-12-24 MED ORDER — GLYCOPYRROLATE 1 MG/5ML IJ SOLN
INTRAMUSCULAR | Status: DC | PRN
Start: 2021-12-24 — End: 2021-12-24
  Administered 2021-12-24 (×2): .2 mg via INTRAVENOUS

## 2021-12-24 MED ORDER — ACETAMINOPHEN 325 MG PO TABS
975.0000 mg | ORAL_TABLET | Freq: Once | ORAL | Status: AC
Start: 2021-12-24 — End: 2021-12-24
  Administered 2021-12-24 (×2): 975 mg via ORAL
  Filled 2021-12-24: qty 3

## 2021-12-24 MED ORDER — NALOXONE HCL 0.4 MG/ML IJ SOLN
0.1000 mg | INTRAMUSCULAR | Status: DC | PRN
Start: 2021-12-24 — End: 2021-12-24

## 2021-12-24 MED ORDER — IOHEXOL 240 MG/ML IJ SOLN
INTRAMUSCULAR | Status: AC
Start: 2021-12-24 — End: ?
  Filled 2021-12-24: qty 30

## 2021-12-24 MED ORDER — LIDOCAINE HCL 2% EX GEL (UROJET)
Status: AC
Start: 2021-12-24 — End: ?
  Filled 2021-12-24: qty 10

## 2021-12-24 MED ORDER — ONDANSETRON HCL 4 MG/2ML IV SOLN
4.0000 mg | Freq: Once | INTRAMUSCULAR | Status: DC | PRN
Start: 2021-12-24 — End: 2021-12-24

## 2021-12-24 MED ORDER — IOHEXOL 240 MG/ML IJ SOLN
INTRAMUSCULAR | Status: DC | PRN
Start: 2021-12-24 — End: 2021-12-24
  Administered 2021-12-24 (×2): 30 mL

## 2021-12-24 MED ORDER — FENTANYL CITRATE (PF) 50 MCG/ML IJ SOLN (WRAPPED RECORD) ~~LOC~~
25.0000 ug | INTRAMUSCULAR | Status: DC | PRN
Start: 2021-12-24 — End: 2021-12-24

## 2021-12-24 MED ORDER — HEPARIN SODIUM (PORCINE) 5000 UNIT/ML IJ SOLN
5000.0000 [IU] | Freq: Once | INTRAMUSCULAR | Status: AC
Start: 2021-12-24 — End: 2021-12-24
  Administered 2021-12-24 (×2): 5000 [IU] via SUBCUTANEOUS
  Filled 2021-12-24: qty 1

## 2021-12-24 MED ORDER — DOCUSATE SODIUM 250 MG OR CAPS
250.0000 mg | ORAL_CAPSULE | Freq: Two times a day (BID) | ORAL | 0 refills | Status: DC
Start: 2021-12-24 — End: 2022-12-01
  Filled 2021-12-24: qty 60, 30d supply, fill #0

## 2021-12-24 MED ORDER — PHENAZOPYRIDINE HCL 99.5 MG PO TABS
100.0000 mg | ORAL_TABLET | Freq: Three times a day (TID) | ORAL | 0 refills | Status: DC | PRN
Start: 2021-12-24 — End: 2022-01-19
  Filled 2021-12-24: qty 12, 4d supply, fill #0

## 2021-12-24 MED ORDER — LIDOCAINE HCL 2% EX GEL (UROJET)
Status: DC | PRN
Start: 2021-12-24 — End: 2021-12-24
  Administered 2021-12-24 (×2): 10 mL via URETHRAL

## 2021-12-24 MED ORDER — LIDOCAINE HCL 2 % IJ SOLN WRAPPED RECORD
INTRAMUSCULAR | Status: DC | PRN
Start: 2021-12-24 — End: 2021-12-24
  Administered 2021-12-24 (×2): 80 mg via INTRAVENOUS

## 2021-12-24 MED ORDER — LACTATED RINGERS IV SOLN
INTRAVENOUS | Status: DC
Start: 2021-12-24 — End: 2021-12-24

## 2021-12-24 SURGICAL SUPPLY — 42 items
BAG CYSTO DRAIN SKYTRON TABLE (Misc Surgical Supply) ×2 IMPLANT
BAG PRESSURE INFUSOR 3000ML (Misc Medical Supply) ×4
BAG URINE DRAIN 200Ml (Lines/Drains) ×2
BASKET ZERO TIP NITINOL 1.9FR Ø12MM X 120CM (Procedural wires/sheaths/catheters/balloons/dilators) ×2
CATHETER FOLEY BARDEX I.C. 16FR 5CC, 2-WAY (Lines/Drains) ×2
CATHETER URETERAL OPEN END 5FR X 70CM (Procedural wires/sheaths/catheters/balloons/dilators) ×2
CATHETER URETHRAL DUAL LUMEN 10 FR X 54 CM (Lines/Drains) ×2
CONTAINER PRECISION SPECIMEN, 4OZ- STERILE (Misc Medical Supply) ×10 IMPLANT
DELIVERY FEE (Services) ×2
DRAIN BAG URINE 2000 ML (Lines/Drains) ×1 IMPLANT
DRAPE X-RAY C ARM 41" X 74" (Drapes/towels)
DRESSING TEGADERM HP 4X4 (Dressings/packing) ×2
DRESSING TELFA STRL 3" X 8" (Dressings/packing) ×2
ELECTRODE CUTTING BIPOLAR 22FR 12/30 DEGREE (Cautery) ×2 IMPLANT
FEE FORTEC HOLMIUM LASER HIGH WATT W/ NON FORTEC LASER (Rentals) ×2 IMPLANT
FIBER LASER HOLMIUM 200 (Misc Medical Supply) ×2
FORCEP BIOPSY BIGOPSY BACKLOADING 2.4FR X 115CM (Procedural wires/sheaths/catheters/balloons/dilators) IMPLANT
FORCEPS BIOPSY URETEROSCOPIC PIRANHA 3F 1.1MM X 115 CM FLEXIBLE (Procedural wires/sheaths/catheters/balloons/dilators)
GLOVE BIOGEL PI INDICATOR SIZE 7.5 (Gloves/Gowns) ×2
GLOVE BIOGEL PI ULTRATOUCH SIZE 6 (Gloves/Gowns) ×2 IMPLANT
GLOVE BIOGEL PI ULTRATOUCH SIZE 7.5 (Gloves/Gowns) ×2 IMPLANT
GLOVE SURGEON BIOGEL SIZE 7.5 (Gloves/Gowns) ×4
GLOVE SURGICAL BIOGEL SIZE 6.5 (Gloves/Gowns) ×4 IMPLANT
GOWN SIRUS SM BLUE, AAMI LVL 3 (Gloves/Gowns) ×2 IMPLANT
GUIDEWIRE SENSOR DUAL FLEX ANGLED TIP .035 X 150CM NITINOL (Procedural wires/sheaths/catheters/balloons/dilators) ×2
GUIDEWIRE SENSOR DUAL FLEX STRAIGHT TIP .035 X 150CM NITINOL (Procedural wires/sheaths/catheters/balloons/dilators) ×4 IMPLANT
HOLDER FOLEY CATH SECUREMENT DEVICE STATLOCK (Patient Care Supply) ×2 IMPLANT
NEEDLE PROTECT 22G X 1.5" (Needles/punch/cannula/biopsy) ×2
PAD GROUND VALLEYLAB REM ADULT E7507 (Cautery) ×2 IMPLANT
SCRUB, SURGICAL, EXIDINE, 4%, CHG, 4OZ (Prep Solutions) IMPLANT
SHEATH URETERAL NAVIGATOR 11F/13F X 46CM (Procedural wires/sheaths/catheters/balloons/dilators) ×2
SLEEVE SCD KNEE MEDIUM (Patient Care Supply) ×2 IMPLANT
SOLUTION IRR BAG .9% N/S 3000ML (Non-Pharmacy Meds/Solutions) ×6 IMPLANT
SOLUTION IRR POUR BTL 0.9% NS 1000ML (Non-Pharmacy Meds/Solutions) IMPLANT
SOLUTION IRR POUR BTL H20 1000ML (Non-Pharmacy Meds/Solutions)
STENT ASCERTA FIRM 6FR X 24CM (Ureteral Stents) ×2 IMPLANT
STRAP POSITIONING KNEE/BODY, HOOK AND LOOP (Patient Care Supply) ×2 IMPLANT
SURGICAL PACK CYSTO - SAME DAY (Procedure Packs/kits) ×2 IMPLANT
SYRINGE CATH TIP 60ML (Misc Surgical Supply) ×2
TOWELS OR BLUE 4-PACK STERILE, DISPOSABLE (Drapes/towels) ×2 IMPLANT
TUBING SUCTION MEDI-VAC 9/32" X 20' (Tubing/Suction) ×2
VALVE ENDOSCOPIC ADJUSTABLE UROSEAL (Misc Surgical Supply) ×2 IMPLANT

## 2021-12-24 NOTE — Telephone Encounter (Signed)
Routing to NP [place] to advise  DOS: 11/08/21  ===========================  Spoke to pt, reported that due to his recent hospitalization,   he's been staying in bed most of the time which caused his tight-feeling discomfort on his lower back.     Pt is Requesting from Dr Earnest Conroy for a muscle relaxant, pt believes that his muscles are stiff due to low activity.  Over all he is recovering well from surgery.    Explained to pt that he needs to discuss his pain issues with Pain mgt MD/ PCP,   but I will also let Dr Earnest Conroy team know of his request as well.    Last visit notes 11/23/21:  Plan:  Continue bracing, pain management and PT    Pt continues to wear brace and has not started PT due to his sickness, has an appt w/ PCP in 3 weeks.

## 2021-12-24 NOTE — Telephone Encounter (Signed)
msg via mychart

## 2021-12-24 NOTE — Telephone Encounter (Signed)
From: Lovena Le  To: [name], MD  Sent: 01/26/1187 8:38 AM PST  Subject: med refills not on my list     may I get a muscle relaxer for my lower back I am using heat and my brace also would like some colace thank you/MR#[id number]

## 2021-12-24 NOTE — Telephone Encounter (Signed)
Will route to RN pool

## 2021-12-24 NOTE — Discharge Instructions (Signed)
Discharge Instructions Following Ureteroscopy       Procedure Performed: left ureteroscopy, bladder tumor resection    Date of Procedure:  12/24/21    Notable Findings/Diagnosis: small tumor in left kidney, mid ureter. All removed    Foley Catheter:  Some patients are sent home with a foley catheter (a hollow tube which drains urine from your bladder), while others go home urinating on their own. If you still have a catheter, you will be provided with instructions regarding its care before you leave. Our schedulers will call you in the next 2-3 business days to set up an appointment for catheter removal. Call the urology clinic if you do not hear from [name]    Ureteral Stent: You have a ureteral stent on the left side  You may have an internal stent (a hollow tube that runs from the kidney to your bladder) after your procedure, helping the urine to drain down from the kidney to your bladder after your surgery. Some patients do not notice that they have a stent, while others complain of the sensation of needing to urinate frequently, burning on urination, or even some back pain (especially when they go to urinate). These sensations usually improve gradually, some faster than others. This is not uncommon, but may initially warrant the use of pain medication, which you were prescribed. While the stent is in place, your urine may continue to be bloody.     Maintain the stent until your follow up with Dr Sallyanne Kuster    Expectations:  It is common to have blood in the urine after your procedure. It may be pink or even red; inform your doctor if you have a significant amount of clots in the urine or if you are unable to urinate at all. Be sure to drink plenty of fluids at all times. It is not uncommon to have some burning when you urinate or to even notice some stone fragments in your urine.    Recommendations:  Drink at least 6-8 glasses of fluid per day; minimize night-time drinking if this causes you to awaken regularly to  urinate   You may resume your regular diet and regular medication regimen  You may shower or bathe.   You may resume your normal level of activity   For pain control, take tylenol and ibuprofen alternating around the clock for the first few days after surgery. If you have an internal ureteral stent, it may cause discomfort which is normal. The goal is control pain/discomfort, it may not resolve completely until it is removed. Take narcotics if tylenol or ibuprofen are not enough but do not operate machinery (I.e. drive vehicles) while taking them due to risk of sedation. Also causes constipation so take stool softeners when taking narcotics  As you have just underwent general anesthesia, you should refrain from driving, heavy lifting, smoking, alcohol consumption, or important decision   making for the next 24 hours. You may climb stairs and you may resume sexual activity.     Medications:  Pyridium - this helps reduce burning pain with urination (or burning pain in the urethra if you have a catheter). Take up to three times per day only as needed for burning pain. Do not take for more than a few days. You may notice [place]-red colored urine while taking this medication which is normal  Tylenol for pain control  Flomax    Follow Up:  Our schedulers will call you to set up a follow appointment or to book  a second stage surgery. If you do not hear from them in 2-3 business days, give [name] a call at the number below and we will sort it out    Urology Contact Information:   Urology contact information   If you have questions or concerns, please call [phone] or contact our team via the MyChart portal. For urgent matters, please call for a timelier response.       Our care team is available Monday-Friday 8:00am-5:00pm. For urgent matters after hours or on weekends, the on-call urologist may be paged though the operator by calling [phone]. For non-urgent matters, please contact during business hours.       In the  event of a life-threatening emergency, please seek care in the emergency department or call 911.

## 2021-12-24 NOTE — Brief Op Note (Signed)
PREOPERATIVE DIAGNOSIS: upper tract urothelialca    POSTOPERATIVE DIAGNOSIS: same    PROCEDURE:   Procedure(s):  Left ureteroscopy, biopsy, with laser fulgration, stone removal, TURBT, cytology washings, insertion of ureteral stent    Primary: Stephen Raspberry, MD  Resident - Assisting: Eilene Ghazi, MD  Fellow: Harland German, MD    ANESTHESIA: Gen ETT    Anesthesiologist: Jacelyn Pi, DO  CRNA: Kevin Fenton, CRNA     OR Staff:  Circulator: Sherry Ruffing, RN; Cletis Athens, RN  Scrub: Dionne Ano  X-Ray Tech: Lenoria Chime    FINDINGS:   -difficult to visualize UO, resected and found  -left URS with <1cm papillary tumorx2 and 6-12mm calculus  -tumor fulgurated, calculus dusted  -[id number] stent placed  -completion TUR around left ureter with bladder specimens sent  -hemostatic  -fulgurated      WOUND CLASSIFICATION:  Procedure(s):  Left ureteroscopy, biopsy, with laser fulgration, stone removal, TURBT, cytology washings, insertion of ureteral stent - Wound Class: Class II (Clean Contaminated) - Incision Closure: No Incision / NA    WOUND CLOSURE STATUS:  Procedure(s):  Left ureteroscopy, biopsy, with laser fulgration, stone removal, TURBT, cytology washings, insertion of ureteral stent - Wound Class: Class II (Clean Contaminated) - Incision Closure: No Incision / NA    SPECIMENS:  ID Type Source Tests Collected by Time Destination   1 : Left renal pelvis urine for cytology Urine Urine CYTOPATH NON GYN Stephen Raspberry, MD 12/24/2021 1051    2 : Left renal pelvis urine for cytology POST ablation Urine Urine CYTOPATH NON GYN Stephen Raspberry, MD 12/24/2021 1100    3 : Left kidney stones Calculus Kidney, Left ANAEROBIC CULTURE, FUNGAL CULTURE, BODY SITE CULTURE W/GRAM STAIN, AEROBIC ONLY, GENERAL AEROBIC CULTURE, OR ONLY Stephen Raspberry, MD 12/24/2021 1124    A : Bladder tumor Tissue Bladder PATHOLOGY TISSUE Veronia Beets, MD 12/24/2021 1047    B : Left mid ureter tumor Tissue Ureter, Left  PATHOLOGY TISSUE Veronia Beets, MD 12/24/2021 1115    C : Left kidnet stone - stone analysis Calculus Kidney, Left PATHOLOGY TISSUE Veronia Beets, MD 12/24/2021 1129        Fluids/Blood Products:      IV Fluids: 600 crystalloid    Blood Products: none    EBL: 15 ml    Urine Output: unmeasured    COMPLICATIONS: none    DISPOSITION:  Extubated in the OR, brought to PACU in stable condition  -home today with foley, flomax    The attending for the case, Dr. Sallyanne Kuster,  was present for the procedure    Event Time In   In [place]   Pre Procedure Start 0731   PreOp Nurse Complete 0833   Pre Procedure Tasks Complete 0909   Room Setup Start 0900   Room Ready for Patient 0940   In Room 0953   Incision 1016   Closing Started 1120   Closing Complete 1134   Out of Room    Room Cleanup Start    Room Cleanup End    In PACU    PACU Criteria Complete    PACU Hold    PACU Hold Complete    In Recovery    Recovery Criteria Complete    Ready For Visitors    Holding in [place]    Anesthesia Start [phone]   Anesthesia Ready 1003   Anesthesia Stop    Epidural to C-Section    Regional Anesthesia Start  Regional Anesthesia Stop    Regional Block Administered

## 2021-12-24 NOTE — Telephone Encounter (Signed)
Rx for Tizanidine sent to pharmacy.

## 2021-12-25 NOTE — Op Note (Unsigned)
DATE OF SERVICE:  12/24/2021    PREOPERATIVE DIAGNOSIS:  Upper tract urothelial cancer.    POSTOPERATIVE DIAGNOSIS:  Upper tract urothelial cancer.    PROCEDURE PERFORMED:    laser lithotripsy, ureteroscopy with tumor laser ablation, transurethral resection of bladder tumor 2-5cm    SURGEON/STAFF:  Carlota Raspberry, MD    ASSISTANT:  Andris Flurry, MD; Harland German, MD    METHOD OF ANESTHESIA:  General.    IV FLUIDS:  600 cc.    URINE OUTPUT:  Unmeasured.    COMPLICATIONS:  None.    ESTIMATED BLOOD LOSS:  10 cc.    SPECIMENS:  Urine cytology and bladder tumor and upper tract tumor.    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  Most recently, he underwent 6 rounds of instillation of Jelmyto and  subsequently is here for a followup ureteroscopy to confirm treatment  for his previous papillary mass of the kidney.     PROCEDURE IN DETAIL:  The patient was met in the preoperative area,  where patient and procedure were confirmed and consent obtained.  All  questions were answered in full.  A full huddle was performed.  He  was given Ancef for antibiotic prophylaxis, and SCDs were placed for  DVT prophylaxis.  He was prepped and draped in the usual sterile  fashion in dorsal lithotomy.  All pressure points were padded.  A  full time-out was performed.     We began with a 19.5Fr [place] rigid cystoscope.  He had no urethral  abnormalities, had a bladder neck defect from his prior TUR.  We  attempted to find the left UO of the solitary kidney but were unable  to identify it.  We therefore switched to a resectoscope and resected in the area of the  presumed UO with some dysplastic changes.  We identified the ureter,  placed 2 wires, advanced an 11/13 x 46 access sheath after a  10-French dual-lumen was  utilized.  We identified 2 papillary tumors,  both 5 mm or less, in the renal pelvis which were laser ablated and  basketed and sent for pathology.  He also had an incidental approximately 7 to  8 mm stone which was dusted.  Remaining upper tract collecting system  survey was negative up until the mid-to-distal ureter, where we found  an additional papillary tumor with images on fluoro demonstrating location.  This was laser  ablated and the rest of the ureter cleared.  We placed a 6 x 24  double-J stent at the end of procedure.  We then utilized a  transurethral resection of the periureteral orifice in the bladder given the  dysplastic changes.  Those were sent for pathology.  Meticulous  hemostasis was obtained, and a 16-French catheter was placed at the  end of the case.  He was instilled with gemcitabine and monitored in  PACU after emergence from anesthesia without complication.  The  attending surgeon, Dr. Carlota Raspberry, was present for the entire  procedure.       Job #:  [id number]

## 2021-12-26 LAB — GENERAL AEROBIC CULTURE, OR ONLY: General Aerobic Culture, OR Only: NO GROWTH

## 2021-12-28 ENCOUNTER — Other Ambulatory Visit: Payer: Self-pay | Admitting: Cardiovascular Disease

## 2021-12-28 ENCOUNTER — Telehealth (HOSPITAL_BASED_OUTPATIENT_CLINIC_OR_DEPARTMENT_OTHER): Payer: Self-pay | Admitting: Urology

## 2021-12-28 NOTE — Telephone Encounter (Signed)
Pt returned phone call. Pt scheduled VT 01/04/2022 unable to do this week due to transportation. Follow up with Dr. Sallyanne Kuster scheduled 01/25/2022.  Routing as [employer]

## 2021-12-28 NOTE — Telephone Encounter (Signed)
Left message for patient to please call our office and set up appts.    CC, VT needs to before 11am this any day this week.    THanks

## 2021-12-28 NOTE — Telephone Encounter (Signed)
-----   Message from Eilene Ghazi, MD sent at 12/24/2021  7:07 PM PST -----  Regarding: Follow up  Hi team    Please arrange follow up with Dr Sallyanne Kuster in 4-6 weeks, nursing visit in 5-6 days for void trial. Order placed.    Thanks,  [employer]

## 2021-12-29 ENCOUNTER — Encounter (INDEPENDENT_AMBULATORY_CARE_PROVIDER_SITE_OTHER): Payer: Self-pay | Admitting: Hospital

## 2021-12-29 ENCOUNTER — Encounter (INDEPENDENT_AMBULATORY_CARE_PROVIDER_SITE_OTHER): Payer: Self-pay | Admitting: Nephrology

## 2021-12-29 DIAGNOSIS — N289 Disorder of kidney and ureter, unspecified: Secondary | ICD-10-CM

## 2021-12-30 IMAGING — CT CT CHEST LUNG CANCER SCREENING LOW DOSE W/O CM
2 of 5 series · 15 of 40 positions shown, 18 images · non-contrast
Comparison: 02/22/2019.

CLINICAL DATA: Former smoker, quit in the last 15 years, 42
pack-year history.

EXAM:
CT CHEST WITHOUT CONTRAST LOW-DOSE FOR LUNG CANCER SCREENING
TECHNIQUE: Multidetector CT imaging of the chest was performed following the
standard protocol without IV contrast.

[Series 3: lung 1.00 · axial · 0.73mm/px · z∈[-1212,-901]mm · 12 of 345 slices shown, 15 images]
[im 17/345  mediastinal]
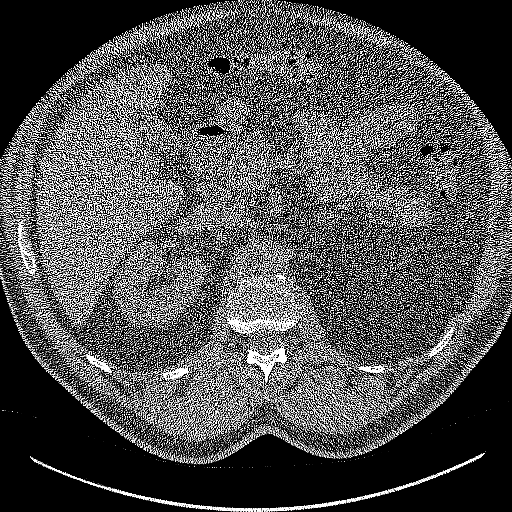
[im 17/345  lung]
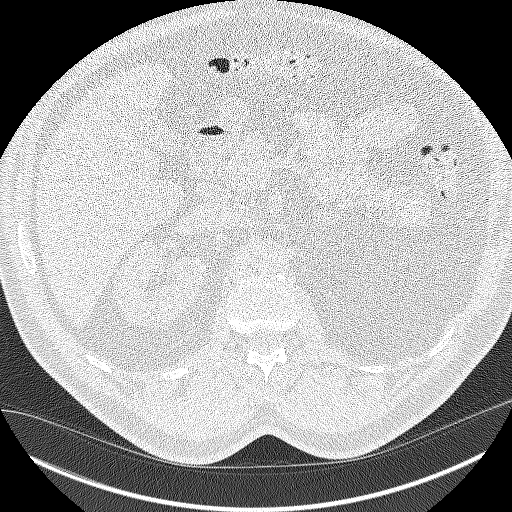
[im 50/345  lung]
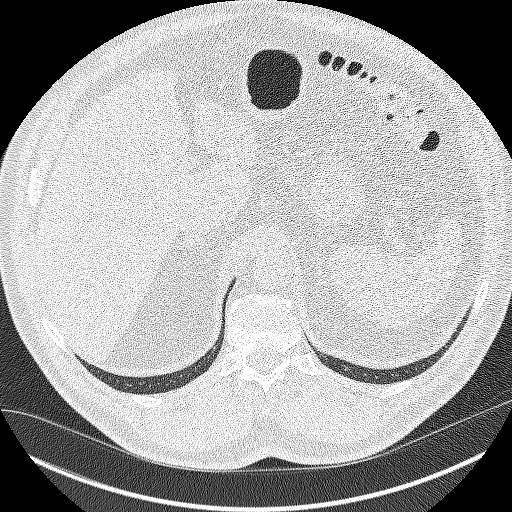
[im 82/345  lung]
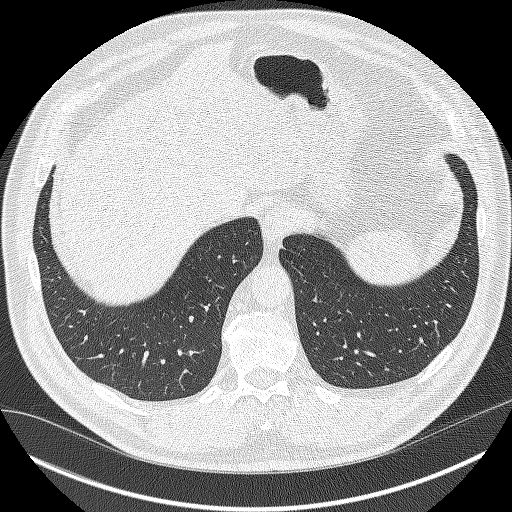
[im 99/345  lung]
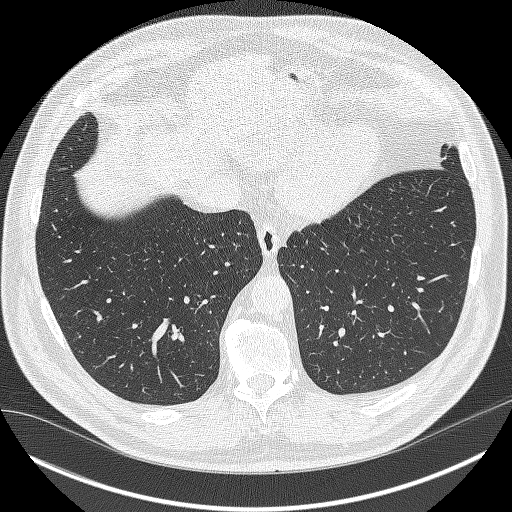
[im 132/345  mediastinal]
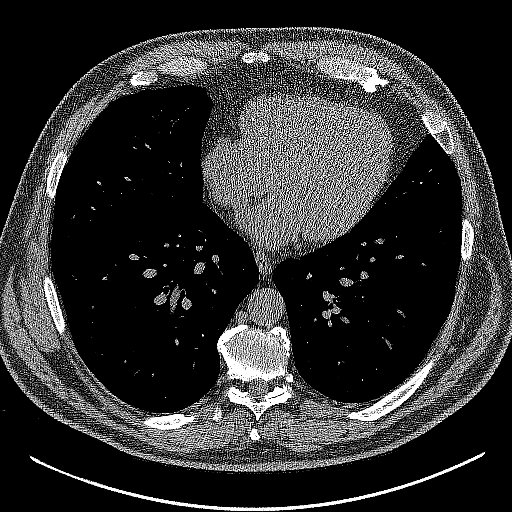
[im 132/345  lung]
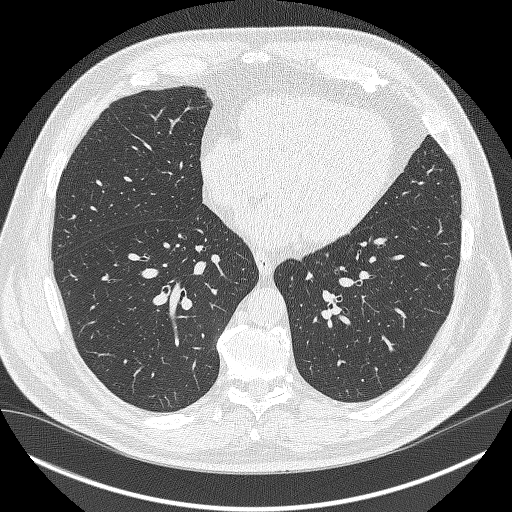
[im 164/345  lung]
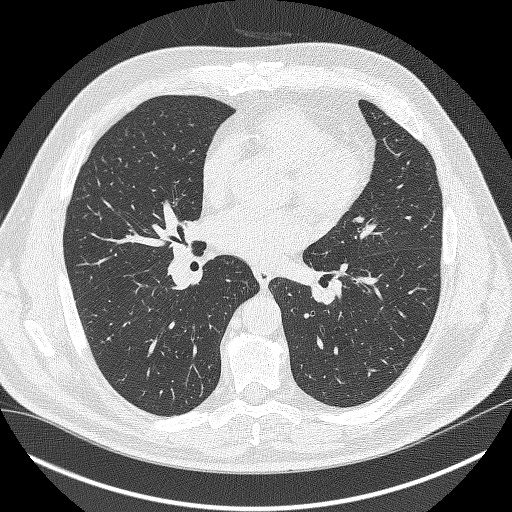
[im 181/345  lung]
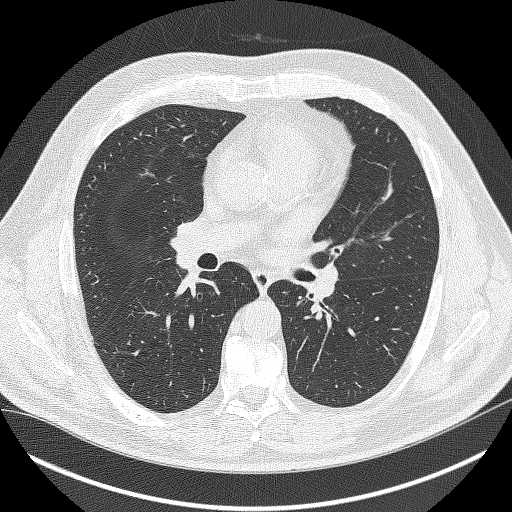
[im 213/345  lung]
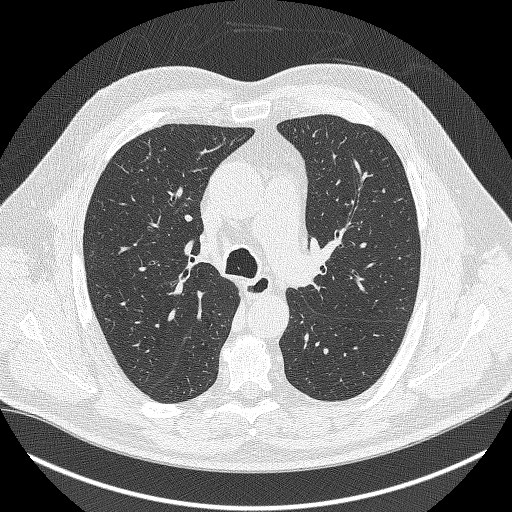
[im 246/345  mediastinal]
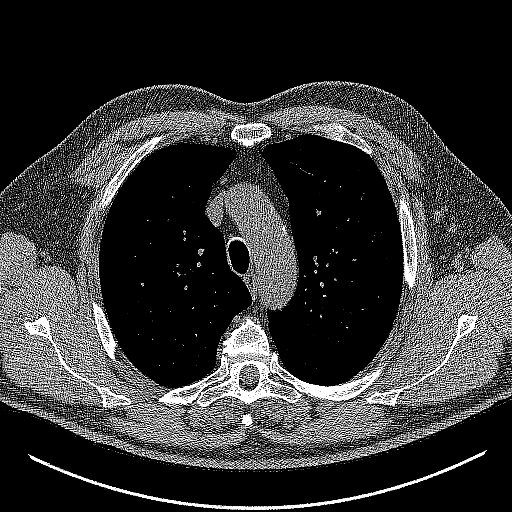
[im 246/345  lung]
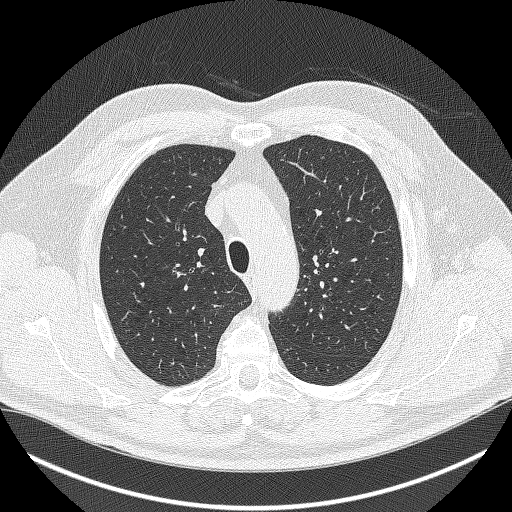
[im 263/345  lung]
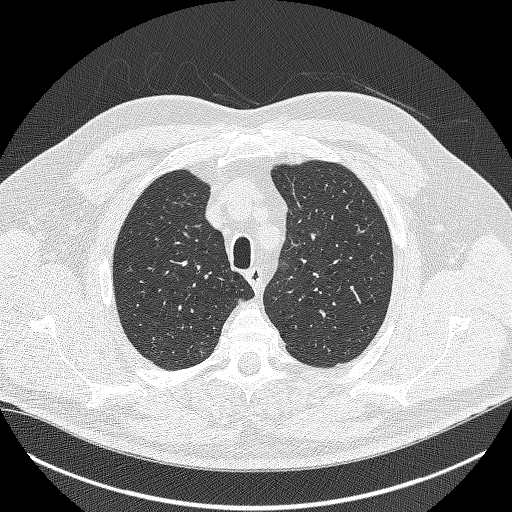
[im 295/345  lung]
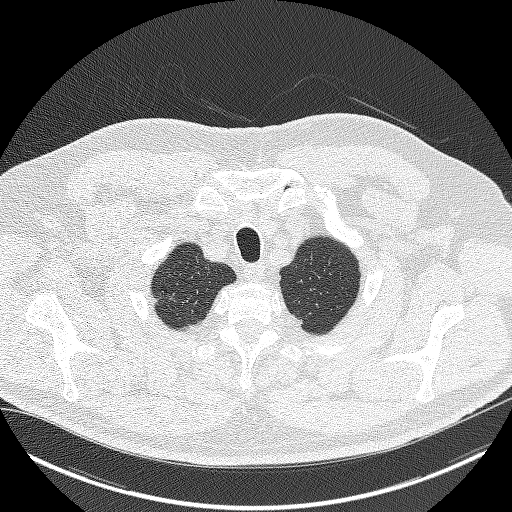
[im 328/345  lung]
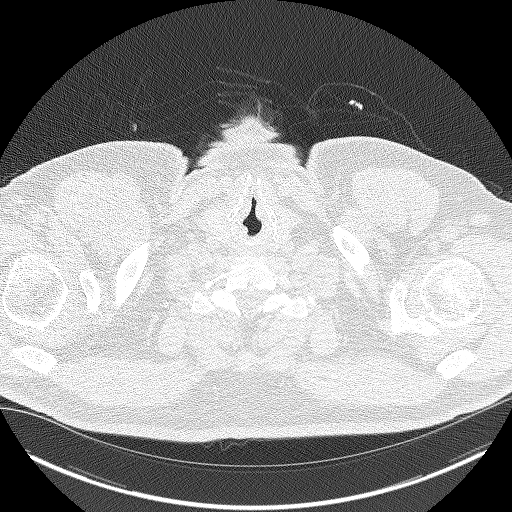

[Series 4: coronals lung 1.00 cor · coronal · 0.67mm/px · 3 of 370 slices shown]
[im 74/370  lung]
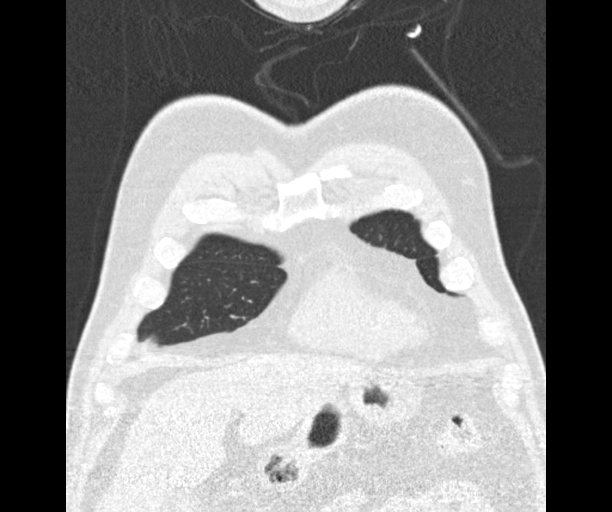
[im 148/370  lung]
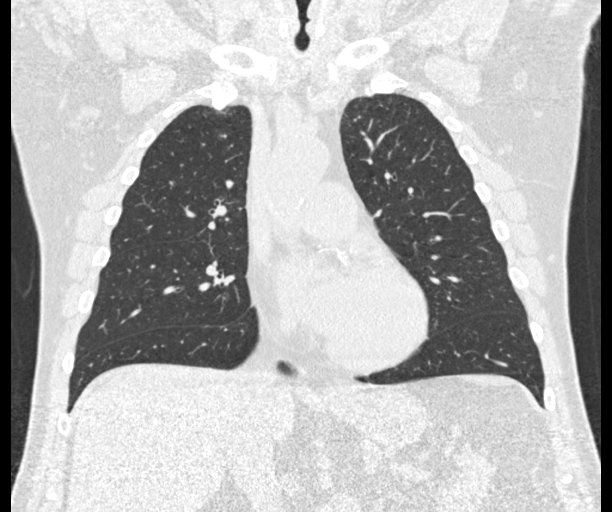
[im 222/370  lung]
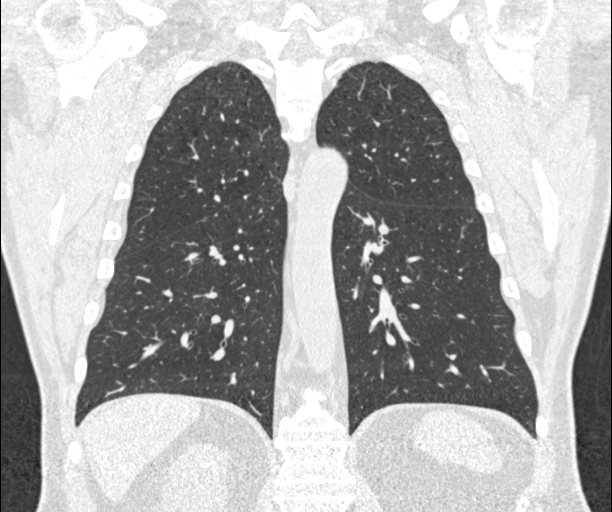

[15 of 40 positions shown; findings below may reference images not displayed]

FINDINGS: Cardiovascular: Atherosclerotic calcification of the aorta aortic
valve and coronary arteries. Pulmonic trunk is enlarged. Heart size
normal. No pericardial effusion.

Mediastinum/Nodes: No pathologically enlarged mediastinal or
axillary lymph nodes. Hilar regions are difficult definitively
evaluate without IV contrast but appear grossly unremarkable.
Esophagus is grossly unremarkable.

Lungs/Pleura: Scattered peribronchovascular nodularity is likely
postinfectious in etiology. Centrilobular emphysema. Pulmonary
nodules measure 4.5 mm or less in size, as before. No new or
worrisome pulmonary nodules. No pleural fluid. Adherent debris in
the trachea. Airway is otherwise unremarkable.

Upper Abdomen: Visualized portions of the liver, gallbladder,
adrenal glands, kidneys, spleen, pancreas, stomach and bowel are
grossly unremarkable.

Musculoskeletal: Degenerative changes in the spine.
IMPRESSION: 1. Lung-RADS 2, benign appearance or behavior. Continue annual
screening with low-dose chest CT without contrast in 12 months.
2. Aortic atherosclerosis (8U7LF-TK4.4). Coronary artery
calcification.
3. Enlarged pulmonic trunk, indicative of pulmonary arterial
hypertension.
4.  Emphysema ([id number].X).

## 2021-12-30 MED ORDER — TAMSULOSIN HCL 0.4 MG PO CAPS
0.4000 mg | ORAL_CAPSULE | Freq: Every day | ORAL | 3 refills | Status: DC
Start: 2021-12-30 — End: 2022-01-24

## 2021-12-30 NOTE — Addendum Note (Signed)
Addended by: Carlota Raspberry on: 12/30/2021 10:02 AM     Modules accepted: Orders

## 2021-12-31 ENCOUNTER — Other Ambulatory Visit: Payer: Self-pay

## 2021-12-31 ENCOUNTER — Ambulatory Visit (INDEPENDENT_AMBULATORY_CARE_PROVIDER_SITE_OTHER): Payer: Medicare Other | Admitting: Cardiovascular Disease

## 2021-12-31 ENCOUNTER — Encounter: Payer: Self-pay | Admitting: Cardiovascular Disease

## 2021-12-31 ENCOUNTER — Other Ambulatory Visit (HOSPITAL_BASED_OUTPATIENT_CLINIC_OR_DEPARTMENT_OTHER): Payer: Self-pay | Admitting: Urology

## 2021-12-31 VITALS — BP 120/60 | HR 61 | Ht 68.0 in | Wt 221.5 lb

## 2021-12-31 DIAGNOSIS — I359 Nonrheumatic aortic valve disorder, unspecified: Secondary | ICD-10-CM | POA: Diagnosis not present

## 2021-12-31 DIAGNOSIS — I2510 Atherosclerotic heart disease of native coronary artery without angina pectoris: Secondary | ICD-10-CM | POA: Diagnosis not present

## 2021-12-31 DIAGNOSIS — I714 Abdominal aortic aneurysm, without rupture, unspecified: Secondary | ICD-10-CM

## 2021-12-31 DIAGNOSIS — E785 Hyperlipidemia, unspecified: Secondary | ICD-10-CM

## 2021-12-31 DIAGNOSIS — Z720 Tobacco use: Secondary | ICD-10-CM

## 2021-12-31 DIAGNOSIS — R339 Retention of urine, unspecified: Secondary | ICD-10-CM

## 2021-12-31 LAB — ANAEROBIC CULTURE

## 2021-12-31 MED ORDER — ATORVASTATIN CALCIUM 80 MG PO TABS: ORAL_TABLET | ORAL | 3 refills | Status: DC

## 2021-12-31 NOTE — Patient Instructions (Signed)
Medication Instructions:  Your physician recommends that you continue on your current medications as directed. Please refer to the Current Medication list given to you today.  A refill for Atorvastatin has been sent to your mail order pharmacy for 90 days supply.  *If you need a refill on your cardiac medications before your next appointment, please call your pharmacy*   Lab Work: None ordered  If you have labs (blood work) drawn today and your tests are completely normal, you will receive your results only by: [place] (if you have MyChart) OR A paper copy in the mail If you have any lab test that is abnormal or we need to change your treatment, we will call you to review the results.   Testing/Procedures: Your physician has requested that you have an abdominal aorta duplex. During this test, an ultrasound is used to evaluate the aorta. Allow 30 minutes for this exam. Do not eat after midnight the day before and avoid carbonated beverages (To be scheduled in March 2023)   Follow-Up: At [facility], you and your health needs are our priority.  As part of our continuing mission to provide you with exceptional heart care, we have created designated Provider Care Teams.  These Care Teams include your primary Cardiologist (physician) and Advanced Practice Providers (APPs -  Physician Assistants and Nurse Practitioners) who all work together to provide you with the care you need, when you need it.  We recommend signing up for the patient portal called "MyChart".  Sign up information is provided on this After Visit Summary.  MyChart is used to connect with patients for Virtual Visits (Telemedicine).  Patients are able to view lab/test results, encounter notes, upcoming appointments, etc.  Non-urgent messages can be sent to your provider as well.   To learn more about what you can do with MyChart, go to [email or web address].    Your next appointment:   Your physician wants you to  follow-up in: 1 year You will receive a reminder letter in the mail two months in advance. If you don't receive a letter, please call our office to schedule the follow-up appointment.   The format for your next appointment:   In Person  Provider:   You may see Kathlyn Sacramento, MD or one of the following Advanced Practice Providers on your designated Care Team:   Murray Hodgkins, NP Christell Faith, PA-C Cadence Kathlen Mody, PA-C{    Other Instructions N/A

## 2021-12-31 NOTE — Progress Notes (Signed)
Cardiology Office Note   Date:  12/31/2021   ID:  Johnny Holland, DOB March 19, 1951, MRN [id number]  PCP:  Ria Bush, MD  Cardiologist:   Kathlyn Sacramento, MD   Chief Complaint  Patient presents with   Other    12 month f/u no complaints today. Meds reviewed verbally with pt.      History of Present Illness:And Johnny Holland is a 71 y.o. male who is here today for a follow-up visit regarding nonobstructive coronary artery disease and aortic valve disease.   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  Echocardiogram in April 2019 showed normal LV systolic function, grade 2 diastolic dysfunction, mildly calcified aortic valve without significant stenosis, mild to moderate aortic regurgitation and no evidence of pulmonary hypertension. Nuclear stress test in 2019 showed small apical defect felt to represent artifact with normal ejection fraction. Most recent echocardiogram in June 2020 showed an EF of 60 to 65%, moderately calcified aortic valve with mild aortic insufficiency and stenosis.    CTA of the coronary arteries done in August 2020 showed a calcium score of 580, nondominant RCA with 50% proximal stenosis and moderate proximal LAD stenosis.  No significant lesions by FFR.  He had myalgia with high-dose atorvastatin that resolved after decreasing the dose to 40 mg once daily.  He is known to have small abdominal aortic aneurysm. He has been doing well with no chest pain, shortness of breath, abdominal pain or palpitations.  He does not smoke but continues to vape.   Past Medical History:  Diagnosis Date   Aortic regurgitation 10/15/2012   Echocardiogram showing calcified aortic valves, mild AS, mod AR, preserved EF 55-65%, mild LV dilation and LVH (02/2017). rec rpt 1 yr.    CAD (coronary artery disease) 01/2017   4v by CT   Carotid stenosis 01/12/2016    Minimal on [name] (01/2016) f/u PRN    COPD (chronic obstructive pulmonary disease) ([place]) 01/2017   mild paraseptal and centrilobular by CT   COVID-19 virus infection 11/2020   Diverticulitis    Diverticulosis of colon    Duodenal ulcer    Hepatic steatosis 01/2017   by CT   History of smoking    HLD (hyperlipidemia)    Right shoulder pain    impingement with partial RTC tear ([place])   Right-sided Bell's palsy 12/06/2018   Thoracic aorta atherosclerosis ([place]) 01/2017   by CT    Past Surgical History:  Procedure Laterality Date   CARDIOVASCULAR STRESS TEST  04/2018   low risk study, no ischemia   COLONOSCOPY  11/30/06   diverticulosis; no polyps   COLONOSCOPY WITH PROPOFOL N/A 04/13/2020   TA x2, divrticulosis, rpt 5 yrs (Vanga, Tally Due, MD)   INCISE AND DRAIN ABCESS     Hemmorhoid   VASECTOMY     x 2     Current Outpatient Medications  Medication Sig Dispense Refill   albuterol (PROVENTIL HFA;VENTOLIN HFA) 108 (90 Base) MCG/ACT inhaler Inhale 2 puffs into the lungs every 6 (six) hours as needed for wheezing or shortness of breath. 1 Inhaler 0   aspirin 81 MG tablet Take 1 tablet (81 mg total) by mouth daily.     atorvastatin (LIPITOR) 80 MG tablet TAKE 1/2 TABLET BY MOUTH ONCE DAILY 15 tablet 0   Multiple Vitamin (MULTI-VITAMIN DAILY PO) Take by mouth.     sildenafil (VIAGRA) 100 MG tablet Take  0.5-1 tablets (50-100 mg total) by mouth as needed for erectile dysfunction. 15 tablet 3   No current facility-administered medications for this visit.    Allergies:   Erythromycin    Social History:  The patient  reports that he quit smoking about 10 years ago. He has a 42.00 pack-year smoking history. He has never used smokeless tobacco. He reports current alcohol use. He reports that he does not use drugs.   Family History:  The patient's family history includes Alcohol abuse in his father and sister; Alzheimer's disease in an other family member; Breast cancer in his  maternal aunt; COPD in his mother; Cataracts in his mother; Cirrhosis in his father; Depression in his sister; Diabetes in his paternal grandmother; Emphysema in his father; Heart attack (age of onset: 74) in his father; Heart failure in his father; Macular degeneration in his mother.    ROS:  Please see the history of present illness.   Otherwise, review of systems are positive for none.   All other systems are reviewed and negative.    PHYSICAL EXAM: VS:  BP 120/60 (BP Location: Left Arm, Patient Position: Sitting, Cuff Size: Normal)    Pulse 61    Ht 5\' 8"  (1.727 m)    Wt 221 lb 8 oz (100.5 kg)    SpO2 96%    BMI 33.68 kg/m  , BMI Body mass index is 33.68 kg/m. GEN: Well nourished, well developed, in no acute distress  HEENT: normal  Neck: no JVD, carotid bruits, or masses Cardiac: RRR; no  rubs, or gallops,no edema .  2 / 6 systolic ejection murmur in the aortic area which is early peaking with radiation to the carotid arteries Respiratory:  clear to auscultation bilaterally, normal work of breathing GI: soft, nontender, nondistended, + BS MS: no deformity or atrophy  Skin: warm and dry, no rash Neuro:  Strength and sensation are intact Psych: euthymic mood, full affect   EKG:  EKG is ordered today. The ekg ordered today demonstrates normal sinus rhythm with left anterior fascicular block  Recent Labs: 03/05/2021: BUN 14; Creatinine, Ser 1.03; Hemoglobin 14.9; Platelets 183.0; Potassium 4.4; Sodium 140 12/06/2021: ALT 24    Lipid Panel    Component Value Date/Time   CHOL 171 12/06/2021 0953   CHOL 141 04/10/2020 1127   TRIG 203 (H) 12/06/2021 0953   HDL 54 12/06/2021 0953   HDL 60 04/10/2020 1127   CHOLHDL 3.2 12/06/2021 0953   VLDL 41 (H) 12/06/2021 0953   LDLCALC 76 12/06/2021 0953   LDLCALC 59 04/10/2020 1127   LDLDIRECT 65 04/10/2020 1127   LDLDIRECT 130.1 10/10/2011 0938      Wt Readings from Last 3 Encounters:  12/31/21 221 lb 8 oz (100.5 kg)  03/08/21 220 lb  2 oz (99.8 kg)  03/02/21 218 lb (98.9 kg)       PAD Screen 04/27/2018  Previous PAD dx? No  Previous surgical procedure? No  Pain with walking? No  Feet/toe relief with dangling? No  Painful, non-healing ulcers? No  Extremities discolored? No      ASSESSMENT AND PLAN:  1.  Coronary artery disease involving native coronary arteries without angina: The patient is known to have mild to moderate nonobstructive coronary artery disease on previous CTA.  He is doing well with no anginal symptoms.  Continue medical therapy.  2.  Aortic valve disease: I reviewed the results of most recent echocardiogram that was done in June 2022 and showed normal LV systolic  function with mild aortic stenosis with mean gradient of 16 mmHg.  Repeat echocardiogram in June 2024.  3.  Hyperlipidemia: He had myalgia with high-dose atorvastatin that resolved after decreasing the dose to 40 mg daily.  I reviewed most recent lipid profile which showed an LDL of 76.  He was out of atorvastatin for 4 days when his lipid profile was checked.  I refilled atorvastatin today.  4.  tobacco use: He quit cigarette smoking but continues to vape.  I discussed with him the importance of cessation.  5.  Small abdominal aortic aneurysm.  This measured 3.4 cm in March on most recent study.  I requested a follow-up ultrasound to be done in March.  Disposition:   FU with me in 1 year  Signed,  Kathlyn Sacramento, MD  12/31/2021 10:28 AM    [place]

## 2021-12-31 NOTE — Telephone Encounter (Signed)
TC to pt using 2 identifiers in regards to rx refill approved and sent to preferred pharmacy.

## 2022-01-02 ENCOUNTER — Encounter: Payer: Self-pay | Admitting: Family Medicine

## 2022-01-03 ENCOUNTER — Encounter (HOSPITAL_BASED_OUTPATIENT_CLINIC_OR_DEPARTMENT_OTHER): Payer: Self-pay | Admitting: Nurse Practitioner

## 2022-01-04 ENCOUNTER — Ambulatory Visit: Payer: Medicare Other

## 2022-01-04 DIAGNOSIS — R339 Retention of urine, unspecified: Secondary | ICD-10-CM

## 2022-01-04 NOTE — Interdisciplinary (Signed)
Patient is seen in nurse visit for voiding trial, patient stated he wants to leave the catheter in.     Voiding trial is not done, will route chart to provider as fyi. Patient reported he was not feeling well because of his chemo therapy. He wants to leave the catheter, stated he is not able to come back in clinic if he goes into retention due to transportation issues.     Assessed catheter, draining yellow clear urine. Adjusted the stat lock per pt request. No blood or blood clots noted in the tubing or bag.     Patient has a follow up appointment with Dr Sallyanne Kuster 01/25/22.

## 2022-01-06 ENCOUNTER — Telehealth (HOSPITAL_BASED_OUTPATIENT_CLINIC_OR_DEPARTMENT_OTHER): Payer: Self-pay | Admitting: Nurse Practitioner

## 2022-01-06 NOTE — Telephone Encounter (Signed)
Closing Mychart Message- New TE created and routed to RN for triage.

## 2022-01-06 NOTE — Telephone Encounter (Signed)
From: Lovena Le  To: Sheria Lang, NP  Sent: 01/03/2022 4:36 PM PST  Subject: lower back discomfort     is there anything to help this lower back discomfort? It feels like the muscles are so tight using heat my daily care routine is very uncomfortable sometimes my legs are affected maybe a strong pain patch or? [id number]#[phone] thank you

## 2022-01-06 NOTE — Telephone Encounter (Signed)
Pt returning call requesting c/b in regards to message below      Phone # [phone] , anytime is ok to c/b

## 2022-01-06 NOTE — Telephone Encounter (Signed)
RN called and left a voicemail for pt to callback for further assessment   =====================================================================    MyChart Message 01/03/22:       DOS: 11/08/21 revision lumbar spine, posterior, lumbar 2-sacrum (Dr. Earnest Conroy)   LOV: 11/23/21   NOV: none

## 2022-01-06 NOTE — Telephone Encounter (Signed)
Called patient. No answer. Left voicemail to call [facility] at [phone].    Call Center: When patient calls back, please route to Ortho RN Pool. We handle triage call backs as a team.     DOS: 11/08/21; DR. Z; 1. Removal of instrumentation L3 through sacrum consisting of rods  and screws ([employer]).   2. Exploration of fusion L3 through S1 (intact).  3. Revision posterior segmental spinal instrumentation and fusion L2  through sacrum, consisting of screws and rods (DePuy Synthes), local  bone autograft, DBM, beta tricalcium phosphate ([employer]).   4. Revision laminectomies L4, L3, partial laminectomy, L2 with  bilateral facetectomies and foraminotomies L2-L3, L3-L4.   5. Decompression of cauda equina and exploration of nerve roots  bilateral L4, L3, L2.     LOV: 11/23/21; NP [place]a  Per LOV:   Plan:  Continue bracing, pain management and PT>    After a discussion we were able to answer all of his questions and he  voiced understanding of this plan and will follow up in 4 weeks.    On follow up he  will  need repeat radiograph

## 2022-01-06 NOTE — Telephone Encounter (Signed)
Symptoms message received on 01/03/2022

## 2022-01-07 NOTE — Telephone Encounter (Signed)
Pt calling stating he missed kimberly call and would like for kimberly to call her back

## 2022-01-10 ENCOUNTER — Inpatient Hospital Stay (HOSPITAL_BASED_OUTPATIENT_CLINIC_OR_DEPARTMENT_OTHER): Payer: Medicare Other

## 2022-01-10 ENCOUNTER — Inpatient Hospital Stay
Admission: EM | Admit: 2022-01-10 | Discharge: 2022-01-19 | DRG: 698 | Disposition: A | Discharge status: Skilled Nursing Facility | Payer: Medicare Other | Attending: Internal Medicine | Admitting: Internal Medicine

## 2022-01-10 ENCOUNTER — Emergency Department (HOSPITAL_COMMUNITY): Payer: Medicare Other

## 2022-01-10 ENCOUNTER — Telehealth (HOSPITAL_BASED_OUTPATIENT_CLINIC_OR_DEPARTMENT_OTHER): Payer: Self-pay | Admitting: Urology

## 2022-01-10 DIAGNOSIS — N390 Urinary tract infection, site not specified: Secondary | ICD-10-CM

## 2022-01-10 DIAGNOSIS — Z905 Acquired absence of kidney: Secondary | ICD-10-CM

## 2022-01-10 DIAGNOSIS — R12 Heartburn: Secondary | ICD-10-CM

## 2022-01-10 DIAGNOSIS — A4181 Sepsis due to Enterococcus: Secondary | ICD-10-CM | POA: Diagnosis present

## 2022-01-10 DIAGNOSIS — Z20822 Contact with and (suspected) exposure to covid-19: Secondary | ICD-10-CM | POA: Diagnosis present

## 2022-01-10 DIAGNOSIS — D849 Immunodeficiency, unspecified: Secondary | ICD-10-CM

## 2022-01-10 DIAGNOSIS — Z79899 Other long term (current) drug therapy: Secondary | ICD-10-CM

## 2022-01-10 DIAGNOSIS — C679 Malignant neoplasm of bladder, unspecified: Secondary | ICD-10-CM | POA: Diagnosis present

## 2022-01-10 DIAGNOSIS — N136 Pyonephrosis: Secondary | ICD-10-CM | POA: Diagnosis present

## 2022-01-10 DIAGNOSIS — N179 Acute kidney failure, unspecified: Secondary | ICD-10-CM | POA: Diagnosis present

## 2022-01-10 DIAGNOSIS — N1330 Unspecified hydronephrosis: Secondary | ICD-10-CM | POA: Diagnosis present

## 2022-01-10 DIAGNOSIS — Z9079 Acquired absence of other genital organ(s): Secondary | ICD-10-CM

## 2022-01-10 DIAGNOSIS — R2689 Other abnormalities of gait and mobility: Secondary | ICD-10-CM

## 2022-01-10 DIAGNOSIS — Y846 Urinary catheterization as the cause of abnormal reaction of the patient, or of later complication, without mention of misadventure at the time of the procedure: Secondary | ICD-10-CM | POA: Diagnosis present

## 2022-01-10 DIAGNOSIS — Z8616 Personal history of COVID-19: Secondary | ICD-10-CM

## 2022-01-10 DIAGNOSIS — K5900 Constipation, unspecified: Secondary | ICD-10-CM | POA: Diagnosis present

## 2022-01-10 DIAGNOSIS — Z9221 Personal history of antineoplastic chemotherapy: Secondary | ICD-10-CM

## 2022-01-10 DIAGNOSIS — G4700 Insomnia, unspecified: Secondary | ICD-10-CM

## 2022-01-10 DIAGNOSIS — Z880 Allergy status to penicillin: Secondary | ICD-10-CM

## 2022-01-10 DIAGNOSIS — I10 Essential (primary) hypertension: Secondary | ICD-10-CM | POA: Diagnosis present

## 2022-01-10 DIAGNOSIS — Z8551 Personal history of malignant neoplasm of bladder: Secondary | ICD-10-CM

## 2022-01-10 DIAGNOSIS — D649 Anemia, unspecified: Secondary | ICD-10-CM | POA: Diagnosis present

## 2022-01-10 DIAGNOSIS — Z789 Other specified health status: Secondary | ICD-10-CM

## 2022-01-10 DIAGNOSIS — Z9049 Acquired absence of other specified parts of digestive tract: Secondary | ICD-10-CM

## 2022-01-10 DIAGNOSIS — A419 Sepsis, unspecified organism: Secondary | ICD-10-CM

## 2022-01-10 DIAGNOSIS — N12 Tubulo-interstitial nephritis, not specified as acute or chronic: Secondary | ICD-10-CM | POA: Diagnosis present

## 2022-01-10 DIAGNOSIS — T83511A Infection and inflammatory reaction due to indwelling urethral catheter, initial encounter: Principal | ICD-10-CM | POA: Diagnosis present

## 2022-01-10 DIAGNOSIS — Z981 Arthrodesis status: Secondary | ICD-10-CM

## 2022-01-10 DIAGNOSIS — M109 Gout, unspecified: Secondary | ICD-10-CM | POA: Diagnosis present

## 2022-01-10 DIAGNOSIS — C662 Malignant neoplasm of left ureter: Secondary | ICD-10-CM

## 2022-01-10 DIAGNOSIS — R748 Abnormal levels of other serum enzymes: Secondary | ICD-10-CM | POA: Diagnosis not present

## 2022-01-10 DIAGNOSIS — N135 Crossing vessel and stricture of ureter without hydronephrosis: Secondary | ICD-10-CM | POA: Diagnosis present

## 2022-01-10 DIAGNOSIS — G8929 Other chronic pain: Secondary | ICD-10-CM | POA: Diagnosis present

## 2022-01-10 DIAGNOSIS — Q620 Congenital hydronephrosis: Secondary | ICD-10-CM

## 2022-01-10 DIAGNOSIS — Z882 Allergy status to sulfonamides status: Secondary | ICD-10-CM

## 2022-01-10 DIAGNOSIS — G9341 Metabolic encephalopathy: Secondary | ICD-10-CM | POA: Diagnosis not present

## 2022-01-10 DIAGNOSIS — R509 Fever, unspecified: Secondary | ICD-10-CM

## 2022-01-10 DIAGNOSIS — F329 Major depressive disorder, single episode, unspecified: Secondary | ICD-10-CM | POA: Diagnosis present

## 2022-01-10 LAB — CBC WITH DIFF, BLOOD
ANC-Automated: 12.8 10*3/uL — ABNORMAL HIGH (ref 1.6–7.0)
Abs Basophils: 0 10*3/uL (ref <0.2)
Abs Eosinophils: 0 10*3/uL (ref 0.0–0.5)
Abs Lymphs: 1.3 10*3/uL (ref 0.8–3.1)
Abs Monos: 1.2 10*3/uL — ABNORMAL HIGH (ref 0.2–0.8)
Basophils: 0 %
Eosinophils: 0 %
Hct: 36.3 % — ABNORMAL LOW (ref 40.0–50.0)
Hgb: 12.4 gm/dL — ABNORMAL LOW (ref 13.7–17.5)
Lymphocytes: 9 %
MCH: 29.6 pg (ref 26.0–32.0)
MCHC: 34.2 g/dL (ref 32.0–36.0)
MCV: 86.6 um3 (ref 79.0–95.0)
MPV: 9.7 fL (ref 9.4–12.4)
Monocytes: 8 %
Plt Count: 297 10*3/uL (ref 140–370)
RBC: 4.19 10*6/uL — ABNORMAL LOW (ref 4.60–6.10)
RDW: 14.9 % — ABNORMAL HIGH (ref 12.0–14.0)
Segs: 83 %
WBC: 15.4 10*3/uL — ABNORMAL HIGH (ref 4.0–10.0)

## 2022-01-10 LAB — COMPREHENSIVE METABOLIC PANEL, BLOOD
ALT (SGPT): 11 U/L (ref 0–41)
AST (SGOT): 30 U/L (ref 0–40)
Albumin: 4.3 g/dL (ref 3.5–5.2)
Alkaline Phos: 111 U/L (ref 40–129)
Anion Gap: 16 mmol/L — ABNORMAL HIGH (ref 7–15)
BUN: 29 mg/dL — ABNORMAL HIGH (ref 8–23)
Bicarbonate: 22 mmol/L (ref 22–29)
Bilirubin, Tot: 1.15 mg/dL (ref <1.2)
Calcium: 9.4 mg/dL (ref 8.5–10.6)
Chloride: 95 mmol/L — ABNORMAL LOW (ref 98–107)
Creatinine: 2.62 mg/dL — ABNORMAL HIGH (ref 0.67–1.17)
Glucose: 115 mg/dL — ABNORMAL HIGH (ref 70–99)
Potassium: 5 mmol/L (ref 3.5–5.1)
Sodium: 133 mmol/L — ABNORMAL LOW (ref 136–145)
Total Protein: 7.2 g/dL (ref 6.0–8.0)
eGFR Based on CKD-EPI 2021 Equation: 26 mL/min

## 2022-01-10 LAB — URINE ELECTROLYTES
Chloride, Urine: 47 mmol/L
Potassium, Urine: 39 mmol/L
Sodium, Urine: 70 mmol/L

## 2022-01-10 LAB — LACTATE, BLOOD: Lactate: 1.7 mmol/L (ref 0.5–2.0)

## 2022-01-10 LAB — URINALYSIS WITH CULTURE REFLEX, WHEN INDICATED
Bilirubin: NEGATIVE
Glucose: NEGATIVE
Ketones: NEGATIVE
Leuk Esterase: 500 Leu/uL — AB
Nitrite: NEGATIVE
RBC: >50 — AB (ref 0–?)
Specific Gravity: 1.015 (ref 1.002–1.030)
Urobilinogen: NEGATIVE
WBC: >50 — AB (ref 0–?)
pH: 6.5 (ref 5.0–8.0)

## 2022-01-10 LAB — INFLUENZA A/B & SARS-COV-2 PCR COMBO FOR RAPID RESPONSE LAB
Influenza A PCR, RRL: NOT DETECTED
Influenza B PCR, RRL: NOT DETECTED
SARS-CoV-2 PCR, RRL: NOT DETECTED

## 2022-01-10 MED ORDER — SODIUM CHLORIDE 0.9 % IV SOLN
1000.0000 mg | INTRAVENOUS | Status: DC
Start: 2022-01-10 — End: 2022-01-11
  Administered 2022-01-10 – 2022-01-11 (×3): 1000 mg via INTRAVENOUS
  Filled 2022-01-10 (×2): qty 1000

## 2022-01-10 MED ORDER — ACETAMINOPHEN 325 MG PO TABS
650.0000 mg | ORAL_TABLET | Freq: Four times a day (QID) | ORAL | Status: DC | PRN
Start: 2022-01-10 — End: 2022-01-12
  Administered 2022-01-10 – 2022-01-12 (×5): 650 mg via ORAL
  Filled 2022-01-10 (×4): qty 2

## 2022-01-10 MED ORDER — LACTATED RINGERS IV SOLN
Freq: Once | INTRAVENOUS | Status: AC
Start: 2022-01-10 — End: 2022-01-10

## 2022-01-10 NOTE — ED Notes (Signed)
Foley catheter output > 152ml. Foley catheter balloon inflated with 80ml without issue.

## 2022-01-10 NOTE — Telephone Encounter (Signed)
RN call to pt using 2 IDs. He is having severe pain to his catheter and left kidney 8/10 he states. He is having very cloudy urine output may be fragments he states.  States he had temp of 99.5 but is actively shaking. RN recommends ER for work up. He will be going to nearest [facility] he states.    Advised to f/u as indicated. Routed to MD for awareness.

## 2022-01-10 NOTE — Telephone Encounter (Signed)
Symptom Call        Who is reporting the symptoms? Pt    What are the symptoms: cloudy urine. Shacking due to pain, might have UTI he states. Pt says he has pain on tip of catheter.    Pain level 0-10: 6    When did the symptoms start: today    Where is the pain located:  tip of catheter    Is there any bleeding: no    Best way to contact patient: [phone]    Is it OK to leave a voicemail: yes    Was patient informed of turnaround time: yes    Was patient informed of ED Precautions: yes

## 2022-01-10 NOTE — ED Notes (Signed)
Bed: 20  Expected date:   Expected time:   Means of arrival:   Comments:  [place] when clean

## 2022-01-10 NOTE — ED Provider Notes (Signed)
ED Provider Note  [facility] electronic medical record reviewed for pertinent medical history.     Stephen [name]  MRN: [id number]  DOB: 12/18/1951  PMD: [name]    CC: Medical Problem (Pt presents to ER c/o "shaking" denies fever/+Folly catheter with pain at insertion site with "nasty looking urine."//No date: Chronic back pain/No date: Congenital hydronephrosis/No date: Gout/No date: Headache/No date: Hematuria/12/10/2021: HTN (hypertension)/No date: Kidney disease/No date: Kidney stones/No date: Major depressive disorder, single episode/No date: Polyarthropathy or polyarthritis of multiple sites/No date: Retinal detachment/No date: Urethral stricture/)    Chief Complaint   Patient presents with    Medical Problem     Pt presents to ER c/o "shaking" denies fever  +Folly catheter with pain at insertion site with "nasty looking urine."    No date: Chronic back pain  No date: Congenital hydronephrosis  No date: Gout  No date: Headache  No date: Hematuria  12/10/2021: HTN (hypertension)  No date: Kidney disease  No date: Kidney stones  No date: Major depressive disorder, single episode  No date: Polyarthropathy or polyarthritis of multiple sites  No date: Retinal detachment  No date: Urethral stricture         HPI: Stephen [name] is a 71 year old male  has a past medical history of Chronic back pain, Congenital hydronephrosis, Gout, Headache, Hematuria, HTN (hypertension) (12/10/2021), Kidney disease, Kidney stones, Major depressive disorder, single episode, Polyarthropathy or polyarthritis of multiple sites, Retinal detachment, and Urethral stricture.    He has no past medical history of AF (atrial fibrillation) (CMS-HCC), Asthma, Chronic kidney disease (CKD), Chronic obstructive airway disease (CMS-HCC), Congestive heart failure (CMS-HCC), Heart disease, or Hypothyroidism. who presents with with chills, bilateral flank pain, left greater than right in addition to cloudy urine from his Foley catheter.  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    PMHx: see HPI  PSHx:  has a past surgical history that includes Nephrectomy (Right, 1995); CT Insertion Of Suprapubic Cath (09/25/2015); Other surgical history; Appendectomy; Cystoscopy; Cystoscopy w/ laser lithotripsy; Colonoscopy; Transurethral resection of prostate; and Spine surgery (09/21).  Shx:  reports that he has never smoked. He has never used smokeless tobacco. He reports that he does not currently use drugs. He reports that he does not drink alcohol.  FamHx: He was adopted. Family history is unknown by patient.  Meds:   Current Outpatient Medications   Medication Instructions    acetaminophen (TYLENOL) 650 mg, Oral, EVERY 6 HOURS PRN    ALAWAY 0.025 % ophthalmic solution INSTILL 1 DROP INTO BOTH EYES TWICE A DAY AS DIRECTED    albuterol 108 (90 Base) MCG/ACT inhaler 2 puffs, Inhalation, EVERY 4 HOURS PRN    allopurinol (ZYLOPRIM) 300 mg, Oral, DAILY    amLODIPINE (NORVASC) 10 mg, Oral, DAILY    ascorbic acid (VITAMIN C) 500 MG/5ML suspension No dose, route, or frequency recorded.    benzonatate (TESSALON) 100 mg, Oral, EVERY 8 HOURS PRN    docusate sodium (COLACE) 100 mg, Oral, 3 TIMES DAILY    docusate sodium (COLACE) 250 mg, Oral, 2 TIMES DAILY    DULoxetine (CYMBALTA) 20 mg, Oral, DAILY    guaiFENesin-dextromethorphan (ROBITUSSIN DM) 100-10 MG/5ML syrup 10 mL, Oral, EVERY 4 HOURS PRN    lidocaine (ASPERCREME) 4 % patch 1 patch, Transdermal, EVERY 24 HOURS, Leave patch on for 12 hours, then remove for 12 hours.    menthol (CEPACOL) 3 MG lozenge 1 lozenge,  Oral, EVERY 2 HOURS PRN    naloxone (NARCAN) 4 mg/0.1 mL nasal spray For suspected opioid overdose, call 911! Then spray once in one nostril. Repeat after 3 minutes if no or minimal response using a new spray in other nostril.    ondansetron (ZOFRAN ODT) 8 mg, Oral, EVERY 8 HOURS PRN    oxyCODONE (ROXICODONE) 5 mg, Oral, 2 TIMES DAILY PRN    Phenazopyridine HCl  99.5 MG TABS Take 1 tablet (100 mg) by mouth 3 times daily as needed for burning pain with urination, or urethral pain.    Polyethyl Glycol-Propyl Glycol (SYSTANE ULTRA OP) No dose, route, or frequency recorded.    polyethylene glycol (GLYCOLAX) 17 g, Oral, DAILY, Mix with 4 to 8 ounces of fluid (water, juice, soda, coffee, or tea) prior to administration.    pregabalin (LYRICA) 100 MG capsule TAKE 1 CAPSULE BY MOUTH EVERYDAY AT BEDTIME    senna (SENOKOT) 8.6 mg, Oral, DAILY    tamsulosin (FLOMAX) 0.4 mg, Oral, DAILY    tizanidine (ZANAFLEX) 2 mg, Oral, EVERY 8 HOURS PRN    triamcinolone (KENALOG) 0.1 % cream 1 Application, Topical, 2 TIMES DAILY, Apply a thin layer as directed     VITAMIN D PO 125 mg, Oral, DAILY     ALL: Amoxicillin and Sulfa drugs  ROS: all other systems reviewed and negative as per my custom and practice for the above complaint    PHYSICAL EXAM  BP 123/63 (BP Location: Left arm, BP Patient Position: Supine)    Pulse 82    Temp 100.1 F (37.8 C)    Resp 16    Ht 5\' 6"  (1.676 m)    Wt 88.2 kg (194 lb 7.1 oz)    SpO2 98%    BMI 31.38 kg/m       General:  Pleasant, well appearing and nontoxic. Normal capillary refill  Eyes:  No injection. Anictertic.  HENT/Neck:  Ears and nose externally normal.  Oropharynx clear, airway patent. Neck supple.   Cardiovascular:  Regular rate, normal rhythm, no murmurs.  Respiratory:  No respiratory distress, No accessory muscle use.   GI:  Soft, non-distended, + ttp to suprapubic region  GU: + left costovertebral angle tenderness.  Integument:  Warm and dry. No rash. No lesions.   Neurologic:  Alert, normal speech and mentation, nonfocal.      Orders Placed This Encounter   Procedures    X-Ray Chest Frontal And Lateral    US Kidney Complete    CBC w/ Diff Lavender    CMP    Urinalysis with Culture Reflex, when indicated    URINE ELECTROLYTES    Basic Metabolic Panel, Blood Green Plasma Separator Tube    CBC w/ Diff Lavender    Prothrombin Time,  Blood Blue    aPTT, Blood Blue      Medications   cefTRIAXone (ROCEPHIN) 1,000 mg in sodium chloride 0.9 % 50 mL IVPB (1,000 mg IntraVENOUS New Bag 01/10/22 1820)   acetaminophen (TYLENOL) tablet 650 mg (650 mg Oral Given 01/10/22 2111)   allopurinol (ZYLOPRIM) tablet 300 mg (has no administration in time range)   amLODIPINE (NORVASC) tablet 10 mg (has no administration in time range)   docusate sodium (COLACE) capsule 250 mg (250 mg Oral Given 01/11/22 0021)   DULoxetine (CYMBALTA) CR capsule 20 mg (has no administration in time range)   oxyCODONE (ROXICODONE) tablet 5 mg (has no administration in time range)   pregabalin (LYRICA) capsule 100 mg (100 mg  Oral Given 01/11/22 0021)   tiZANidine (ZANAFLEX) tablet 2 mg (has no administration in time range)   sodium chloride 0.9 % flush 3 mL (has no administration in time range)   sodium chloride 0.9 % flush 3 mL (has no administration in time range)   sodium chloride 0.9 % TKO infusion (has no administration in time range)   polyethylene glycol (MIRALAX) packet 17 g (has no administration in time range)   aluminum-magnesium-simethicone (MAG-AL PLUS) 200-200-20 MG/5ML suspension 30 mL (has no administration in time range)   heparin injection 5,000 Units (has no administration in time range)   lactated ringers 1,000 mL IV bolus ( IntraVENOUS Completed 01/10/22 2105)        Other independent historian: patient  I have reviewed external records: Yes    ASSESSMENT/PLAN/MDM:   71 year old with left flank pain, fevers, chills and indwelling Foley with cloudy urine  -patient grossly nontoxic appearing  -vitals notable for fever of 101.2  -exam with left CVA tenderness in the setting of single kidney  -highly concerning for pyelonephritis  -plan to treat with IV antibiotics and anticipate admission    ED updates    ED Course as of 01/11/22 0042   Vevelyn Francois J's Documentation   Mon Jan 10, 2022   1830 WBC(!): 15.4   1831 Creatinine(!): 2.62  Noted leukocytosis and acute renal  failure in the setting of CVA tenderness, consistent with pyelonephritis.  Treated with antibiotics, ceftriaxone, pending admission            ED Diagnoses:  1. Pyelonephritis        2. Sepsis, due to unspecified organism, unspecified whether acute organ dysfunction present (CMS-HCC)        3. AKI (acute kidney injury) (CMS-HCC)            Consultations: I discussed the patient's care with the consulting services/consultants, if any, listed below.    Data Reviewed:          Risk of Complications and/or Morbidity:            Discharge instructions explained to patient at length and the patient voiced understanding and will return if any new or concerning issues arise       Eilene Ghazi, MD  01/11/22 [phone]

## 2022-01-10 NOTE — Telephone Encounter (Signed)
Called patient. No answer. Left voicemail to call [facility] at [phone].    Call Center: When patient calls back, please route to Ortho RN Pool. We handle triage call backs as a team.

## 2022-01-10 NOTE — ED Floor Report (Signed)
ED to [place] created by Ernestine Conrad, RN at 8:38 PM 01/10/2022.     HANDOFF REPORT UPDATE/CHANGES (changes in patient status/care/events prior to transfer)  By who:  Time:   Additional information:                                                                                                                                                     Stephen [name] is a 71 year old male.    Brief Summary of ED Visit (to include focused assessment and neuro status):      Pt is Aox4, ambulatory.    HPE per ED MD:  Medical Problem (Pt presents to ER c/o "shaking" denies fever/+Folly catheter with pain at insertion site with "nasty looking urine."//No date: Chronic back pain/No date: Congenital hydronephrosis/No date: Gout/No date: Headache/No date: Hematuria/12/10/2021: HTN (hypertension)/No date: Kidney disease/No date: Kidney stones/No date: Major depressive disorder, single episode/No date: Polyarthropathy or polyarthritis of multiple sites/No date: Retinal detachment/No date: Urethral stricture/)    RN shift assessment exceptions to WDL:   Please see above    Any significant events and interventions with responses:    Medications   cefTRIAXone (ROCEPHIN) 1,000 mg in sodium chloride 0.9 % 50 mL IVPB (1,000 mg IntraVENOUS New Bag 01/10/22 1820)   lactated ringers 1,000 mL IV bolus ( IntraVENOUS New Bag 01/10/22 1820)     X-Ray Chest Frontal And Lateral   Final Result   IMPRESSION:   No acute findings. No pneumonia.      US Kidney Complete    (Results Pending)         Radiologic studies not completed: [name]  (None unless otherwise noted)    Chief Complaint   Patient presents with    Medical Problem     Pt presents to ER c/o "shaking" denies fever  +Folly catheter with pain at insertion site with "nasty looking urine."    No date: Chronic back pain  No date: Congenital hydronephrosis  No date: Gout  No date: Headache  No date: Hematuria  12/10/2021: HTN (hypertension)  No date: Kidney disease  No date: Kidney  stones  No date: Major depressive disorder, single episode  No date: Polyarthropathy or polyarthritis of multiple sites  No date: Retinal detachment  No date: Urethral stricture         Admitted for: Pyelonephritis    Code Status:  Please refer to In-pt admitting doctors orders     Level of Care: MS     Sepsis Section:    Is patient septic? no If yes, complete below:    BC x 2 drawn? No     Repeat lactate needed? no  If Yes, when is it due?  N/a    Have TWO Blood Pressures (with MAPs) been documented AFTER crystalloids completed?  no     All initial antibiotics given?  yes  If No, explain:  n/a    Amount of IV fluids received 1000 mL  _________________________________________________________________      Cardiac rhythm: Sinus rhythm    Oxygen Delivery: None    Past Medical History:   Diagnosis Date    Chronic back pain     Congenital hydronephrosis     Gout     Headache     Hematuria     HTN (hypertension) 12/10/2021    Kidney disease     Kidney stones     Major depressive disorder, single episode     Polyarthropathy or polyarthritis of multiple sites     Retinal detachment     Urethral stricture        Past Surgical History:   Procedure Laterality Date    CT INSERTION OF SUPRAPUBIC CATH  09/25/2015    NEPHRECTOMY Right 1995    APPENDECTOMY      COLONOSCOPY      CYSTOSCOPY      CYSTOSCOPY W/ LASER LITHOTRIPSY      OTHER SURGICAL HISTORY      Interstim 01/29/2011    SPINE SURGERY  09/21    Lumbar-sacral fusion    TRANSURETHRAL RESECTION OF PROSTATE         Allergies: Amoxicillin and Sulfa drugs    ED Fall Risk: (!) Yes    Skin issues:  no    >> If yes, note areas of skin breakdown. See appropriate photos.      Ambulatory:  yes    Sitter needed: no    CSSRS Suicide Risk Level Screening in Triage: Minimal Risk    Does this patient have a history of aggressive behavior and/or is this patient a green banner patient? no    Isolation Required: no     >> If yes , what type of isolation: n/a    Is patient in custody?   no    Is patient in restraints? no    Swallow Screen:      Swallow Screen Result:      Was the patient made NPO & a Speech Language Pathology Consult Ordered? no    Vitals:    01/10/22 1538 01/10/22 1843 01/10/22 2000   BP: 137/64 138/61 123/63   BP Location: Right arm Right arm Left arm   BP Patient Position: Sitting Sitting Supine   Pulse: 81 86 82   Resp: 16 14 16    Temp: (!) 101.2 F (38.4 C)  100.1 F (37.8 C)   SpO2: 97% 98% 98%   Weight: 88.2 kg (194 lb 7.1 oz)     Height: 5\' 6"  (1.676 m)              Lab Results   Component Value Date    WBC 15.4 (H) 01/10/2022    RBC 4.19 (L) 01/10/2022    HGB 12.4 (L) 01/10/2022    HCT 36.3 (L) 01/10/2022    MCV 86.6 01/10/2022    MCHC 34.2 01/10/2022    RDW 14.9 (H) 01/10/2022    PLT 297 01/10/2022    MPV 9.7 01/10/2022       Lab Results   Component Value Date    NA 133 (L) 01/10/2022    K 5.0 01/10/2022    CL 95 (L) 01/10/2022    BICARB 22 01/10/2022    BUN 29 (H) 01/10/2022    CREAT 2.62 (H) 01/10/2022    GLU 115 (  H) 01/10/2022    [place] 9.4 01/10/2022       Lab Results   Component Value Date    LACTATE 1.7 01/10/2022       No results found for: CPK, CKMBH, TROPONIN    No results found for: PH, PCO2, O2CONTENT, IVHC3, IVBE, O2SAT, UNPH, UNPCO2, ARTPH, ARTPCO2, ARTO2CNT, IAHC3, IABE, ARTO2SAT, UNAPH, UNAPCO2    No results found for this visit on 01/10/22.      Patient Lines/Drains/Airways Status       Active PICC Line / CVC Line / PIV Line / Drain / Airway / Intraosseous Line / Epidural Line / ART Line / Line Type / Wound / Pressure Ulcer Injury       Name Placement date Placement time Site Days    Peripheral IV - 20 G Right Antecubital 01/10/22  1558  Antecubital  less than 1    Indwelling Urinary Catheter -  01/10/22 RN Latex free 16 fr  Yes 01/10/22  1836  Latex free  less than 1    Stent -  Ureteral (left) 12/24/21  1102  Ureteral (left)  17                        ED Handoff Report is ready for review.  Admitting RN may reach Emergency Department RN, Ernestine Conrad, RN,  at [phone] with any questions.

## 2022-01-10 NOTE — Telephone Encounter (Signed)
DOS: 11/08/21 revision lumbar spine, posterior, lumbar 2-sacrum (Dr. Earnest Conroy)   LOV: 11/23/21   NOV: none         Returned call to patient. Reports that he is currently at [facility] ED.   He states worsening lower back pain since revision.  He States he is currently at [facility] ER waiting  101 fever, chills, lower back pain   +foley catheter  He is in the waiting room.   Given [place] phone number [phone] for any further questions/concerns.

## 2022-01-10 NOTE — ED Notes (Signed)
US at bedside

## 2022-01-10 NOTE — H&P (Addendum)
HOSPITAL MEDICINE HISTORY AND PHYSICAL    Chief Complaint  shaking    History of Present Illness  Patient is a 71 y.o. male with PMH HTN, depression, arthritis, urothelial [place] s/p chemo and surgery, solitary L kidney and chronic indwelling foley presenting with cloudy urine and shakes.    Patient reports that over the last week he has developed pain around his foley insertion site and urine has turned more brown/cloudy or "rust" colored. Also has been endorsing left flank/back pain and LLQ abdominal pan. Foley has been draining appropriately without any sign of obstruction. Took his temp today and was noted to be 99.5 so decided to come to the ed.    On arrival here patient was HDS but febrile to 101.2.  Labs notable for creatinine of 2.62 (baseline of 1.0), WBC 15.4. blood culture sent and UA pending, started on empiric CTX given UTI hx and concern for pyelonephritis. No CT obtained given elevated creatinine.    Review of Systems  A 12-point review of systems was conducted and negative except as otherwise noted in the history of present illness.    Past Medical History  Past Medical History:   Diagnosis Date   . Chronic back pain    . Congenital hydronephrosis    . Gout    . Headache    . Hematuria    . HTN (hypertension) 12/10/2021   . Kidney disease    . Kidney stones    . Major depressive disorder, single episode    . Polyarthropathy or polyarthritis of multiple sites    . Retinal detachment    . Urethral stricture        Past Surgical History  Past Surgical History:   Procedure Laterality Date   . CT INSERTION OF SUPRAPUBIC CATH  09/25/2015   . NEPHRECTOMY Right 1995   . APPENDECTOMY     . COLONOSCOPY     . CYSTOSCOPY     . CYSTOSCOPY W/ LASER LITHOTRIPSY     . OTHER SURGICAL HISTORY      Interstim 01/29/2011   . SPINE SURGERY  09/21    Lumbar-sacral fusion   . TRANSURETHRAL RESECTION OF PROSTATE         Medications  Current Facility-Administered Medications on File Prior to Encounter   Medication Dose Route  Frequency Provider Last Rate Last Admin   . lidocaine (UROJET) 2 % topical jelly 10 mL  10 mL Urethral Daily PRN Carlota Raspberry, MD   10 mL at 12/16/21 1133     Current Outpatient Medications on File Prior to Encounter   Medication Sig Dispense Refill   . acetaminophen (TYLENOL) 325 MG tablet Take 2 tablets (650 mg) by mouth every 6 hours as needed for Mild Pain (Pain Score 1-3) (alternate with motrin). 30 tablet 0   . ALAWAY 0.025 % ophthalmic solution INSTILL 1 DROP INTO BOTH EYES TWICE A DAY AS DIRECTED     . albuterol 108 (90 Base) MCG/ACT inhaler Inhale 2 puffs by mouth every 4 hours as needed for Wheezing. 6.7 g 0   . allopurinol (ZYLOPRIM) 300 MG tablet Take 300 mg by mouth daily.     Marland Kitchen amLODIPINE (NORVASC) 5 MG tablet Take 10 mg by mouth daily.     Marland Kitchen ascorbic acid (VITAMIN C) 500 MG/5ML suspension      . benzonatate (TESSALON) 100 MG capsule Take 1 capsule (100 mg) by mouth every 8 hours as needed for Cough. 30 capsule 0   . docusate  sodium (COLACE) 100 MG capsule Take 1 capsule (100 mg) by mouth 3 times daily for 5 days. 15 capsule 0   . docusate sodium (COLACE) 250 MG capsule Take 1 capsule (250 mg) by mouth 2 times daily. 60 capsule 0   . DULoxetine (CYMBALTA) 20 MG CR capsule Take 1 capsule (20 mg) by mouth daily. 90 capsule 3   . [DISCONTINUED] gabapentin (NEURONTIN) 100 MG capsule Take 1 capsule (100 mg) by mouth 3 times daily for 7 days. Start taking after your procedure is completed. 21 capsule 0   . guaiFENesin-dextromethorphan (ROBITUSSIN DM) 100-10 MG/5ML syrup Take 10 mL by mouth every 4 hours as needed for Cough. 560 mL 0   . lidocaine (ASPERCREME) 4 % patch Apply 1 patch topically every 24 hours. Leave patch on for 12 hours, then remove for 12 hours. 30 patch 0   . menthol (CEPACOL) 3 MG lozenge Take 1 lozenge by mouth every 2 hours as needed for Pain. 60 lozenge 0   . naloxone (NARCAN) 4 mg/0.1 mL nasal spray For suspected opioid overdose, call 911! Then spray once in one nostril. Repeat  after 3 minutes if no or minimal response using a new spray in other nostril. 2 each 0   . ondansetron (ZOFRAN ODT) 8 MG disintegrating tablet Take 1 tablet (8 mg) on or under the tongue every 8 hours as needed for Nausea/Vomiting. 10 tablet 0   . oxyCODONE (ROXICODONE) 5 MG immediate release tablet Take 1 tablet (5 mg) by mouth 2 times daily as needed for Moderate Pain (Pain Score 4-6) or Severe Pain (Pain Score 7-10). 30 tablet 0   . Phenazopyridine HCl 99.5 MG TABS Take 1 tablet (100 mg) by mouth 3 times daily as needed for burning pain with urination, or urethral pain. 12 tablet 0   . Polyethyl Glycol-Propyl Glycol (SYSTANE ULTRA OP)      . polyethylene glycol (GLYCOLAX) 17 GM/SCOOP powder Take 17 g by mouth daily. Mix with 4 to 8 ounces of fluid (water, juice, soda, coffee, or tea) prior to administration. 255 g 0   . pregabalin (LYRICA) 100 MG capsule TAKE 1 CAPSULE BY MOUTH EVERYDAY AT BEDTIME 30 capsule 0   . senna (SENOKOT) 8.6 MG tablet Take 1 tablet (8.6 mg) by mouth daily. 30 tablet 0   . tamsulosin (FLOMAX) 0.4 MG capsule Take 1 capsule (0.4 mg) by mouth daily. 30 capsule 3   . tizanidine (ZANAFLEX) 2 MG tablet Take 1 tablet (2 mg) by mouth every 8 hours as needed (spasm). 40 tablet 0   . triamcinolone (KENALOG) 0.1 % cream Apply 1 Application topically 2 times daily. Apply a thin layer as directed     . VITAMIN D PO Take 125 mg by mouth daily.         Allergies  Amoxicillin and Sulfa drugs    Family History  Family History   Adopted: Yes   Family history unknown: Yes       Social History  Social History     Socioeconomic History   . Marital status: Single   Tobacco Use   . Smoking status: Never   . Smokeless tobacco: Never   Substance and Sexual Activity   . Alcohol use: No   . Drug use: Not Currently   . Sexual activity: Not Currently     Partners: Female   Other Topics Concern   . Military Service No   . Blood Transfusions Yes   . Caffeine Concern No   .  Occupational Exposure No   . Hobby Hazards No    . Sleep Concern Yes     Comment: due to my lower back discomfort and leg discomfort   . Stress Concern Yes     Comment: Health/ housing/ financial   . Weight Concern No   . Special Diet No   . Back Care Yes     Comment: Am very careful with any activity   . Exercises Regularly Yes   . Seat Belt Use Yes   . Performs Self-Exams Yes   . Bike Helmet Use No       Physical Exam  Constitutional:       General: He is not in acute distress.     Appearance: He is not ill-appearing.   HENT:      Head: Normocephalic and atraumatic.      Mouth/Throat:      Mouth: Mucous membranes are moist.      Pharynx: Oropharynx is clear.   Eyes:      Extraocular Movements: Extraocular movements intact.      Pupils: Pupils are equal, round, and reactive to light.   Cardiovascular:      Rate and Rhythm: Normal rate and regular rhythm.      Heart sounds: No murmur heard.    No friction rub. No gallop.   Pulmonary:      Effort: No respiratory distress.      Breath sounds: No wheezing or rales.   Abdominal:      General: There is no distension.      Palpations: Abdomen is soft.      Tenderness: There is abdominal tenderness (LLQ).   Musculoskeletal:      Cervical back: Normal range of motion and neck supple.      Right lower leg: No edema.      Left lower leg: No edema.   Skin:     General: Skin is warm and dry.   Neurological:      General: No focal deficit present.      Mental Status: He is alert and oriented to person, place, and time.   Psychiatric:         Mood and Affect: Mood normal.         Behavior: Behavior normal.         Temperature:  [101.2 F (38.4 C)] 101.2 F (38.4 C) (01/23 1538)  Blood pressure (BP): (137-138)/(61-64) 138/61 (01/23 1843)  Heart Rate:  [81-86] 86 (01/23 1843)  Respirations:  [14-16] 14 (01/23 1843)  Pain Score: 9 (01/23 1843)  O2 Device: None (Room air) (01/23 1843)  SpO2:  [97 %-98 %] 98 % (01/23 1843)    Weights (last 3 days)     Date/Time Weight Wt change from last wt to today (g)  Who    01/10/22 1538  88.2 kg (194 lb 7.1 oz) N/A AC          Labs    Lab Results   Component Value Date    WBC 15.4 (H) 01/10/2022    RBC 4.19 (L) 01/10/2022    HGB 12.4 (L) 01/10/2022    HCT 36.3 (L) 01/10/2022    MCV 86.6 01/10/2022    MCHC 34.2 01/10/2022    RDW 14.9 (H) 01/10/2022    PLT 297 01/10/2022    MPV 9.7 01/10/2022    SEG 83 01/10/2022    LYMPHS 9 01/10/2022    MONOS 8 01/10/2022    EOS 0 01/10/2022  BASOS 0 01/10/2022     Lab Results   Component Value Date    NA 133 (L) 01/10/2022    K 5.0 01/10/2022    CL 95 (L) 01/10/2022    BICARB 22 01/10/2022    BUN 29 (H) 01/10/2022    CREAT 2.62 (H) 01/10/2022    GLU 115 (H) 01/10/2022    [place] 9.4 01/10/2022        Lab Results   Component Value Date    AST 30 01/10/2022    ALT 11 01/10/2022    ALK 111 01/10/2022    TP 7.2 01/10/2022    ALB 4.3 01/10/2022    TBILI 1.15 01/10/2022     No results found for: INR, PTT  Lab Results   Component Value Date    URINECULTURE (A) 06/25/2021     Enterococcus faecalis  10,000 - 50,000 CFU/mL  High Level Gentamicin Resistant  Identification performed by [employer]( [facility]ToF). This test  was developed and its performance characteristics determined by [facility]. It has not been cleared or  approved by the U.S. Food and Drug Administration.  The FDA has  determined that such clearance or approval is not necessary.      URINECULTURE (A) 11/16/2017     ESBL Escherichia coli  >100,000 colonies/mL  Produces Extended Spectrum Beta-Lactamase  Identification performed by [employer] [facility]( [facility]ToF). This test  was developed and its performance characteristics determined by [facility]. It has not been cleared or  approved by the U.S. Food and Drug Administration.  The FDA has  determined that such clearance or approval is not necessary.      URINECULTURE (A) 11/16/2017     Staphylococcus aureus  >100,000 colonies/mL  Identification performed by [employer]( [facility]di-ToF). This  test  was developed and its performance characteristics determined by [facility]. It has not been cleared or  approved by the U.S. Food and Drug Administration.  The FDA has  determined that such clearance or approval is not necessary.         Imaging    X-Ray Chest Frontal And Lateral    Result Date: 01/10/2022  EXAM DESCRIPTION: X-RAY CHEST FRONTAL AND LATERAL CLINICAL HISTORY: Fever, immunocompromised COMPARISON: 12/02/2021 TECHNIQUE: Frontal and lateral chest radiograph FINDINGS: Lungs are well expanded and clear. No pleural effusion or pneumothorax. Normal trachea and hilar regions. Cardiomediastinal silhouette is within normal limits. No acute osseous abnormality. Degenerative changes of the thoracic spine.    Signed by: Rolly Salter 01/10/2022 17:34:24    IMPRESSION: No acute findings. No pneumonia.      Assessment and Plan    #Pyelonephritis: Patient here with L flank pain, fever, dysuria consistent with clinical pyelonephritis. UA with >500 LE and >50 WBC. In the past has grown ESBL ecoli (2018) and enterococcus however not septic and overall well appearing so will epirically cover with CTX for now until results return.   - CTX  - f/u ucx, blood cultures  - foley exchanged in the ED  - left kidney U/s ordered - if evidence of hydro or other concerning finding c/s urology for eval    #AKI: Creatinine up to 2.6 from normal BL. ? Due to infection vs anatomic issue in setting of fairly recent manipulation/stent placement.  - kidney u/s ordered - pending result uro consult  - f/u ua  - urine lytes    #chronic urinary retention: since L2-L3 XLIF, foley in  place.   - continue tamsulosin  - foley exchanged    #Urothelial cancer: s/p Left ureteroscopy, biopsy, with laser fulgration, stone removal, TURBT, cytology washings, insertion of ureteral stent on 1/6.  left URS with <1cm papillary tumor x 2 and 6-8 mm calculus.  - per uro note -tumor fulgurated, stone removed  - path 1/6  showing Focal High-grade urothelial dysplasia in a background of acute and chronic inflammation with reactive urothelial changes  - f/u urology - scheduled to see them on 2/7 for tx planning    #HTN: continue amlodipine  #Gout: continue allopurinol  #Chronic back pain: continue duloxetine, lyrica    Lenis Noon, MD  Division of [facility] Medicine

## 2022-01-10 NOTE — ED Notes (Addendum)
Removed pt's old foley cath.   Removed 80mls of fluid from balloon.   Carefully removed and disposed of foley cath.   Cleansed site and placed new latex free 16 fr foley.  No resistance, smooth insertion.   No urine draining.   Left foley cath w balloon uninflated.   Foley is secured w leg tape clamp.   Will observe. Ts.

## 2022-01-11 ENCOUNTER — Encounter (HOSPITAL_COMMUNITY)
Admission: EM | Disposition: A | Discharge status: Skilled Nursing Facility | Payer: Self-pay | Attending: Internal Medicine

## 2022-01-11 ENCOUNTER — Inpatient Hospital Stay (HOSPITAL_BASED_OUTPATIENT_CLINIC_OR_DEPARTMENT_OTHER): Payer: Medicare Other

## 2022-01-11 ENCOUNTER — Inpatient Hospital Stay (HOSPITAL_COMMUNITY): Payer: Medicare Other

## 2022-01-11 DIAGNOSIS — T83511A Infection and inflammatory reaction due to indwelling urethral catheter, initial encounter: Principal | ICD-10-CM

## 2022-01-11 DIAGNOSIS — N139 Obstructive and reflux uropathy, unspecified: Secondary | ICD-10-CM

## 2022-01-11 DIAGNOSIS — N1330 Unspecified hydronephrosis: Secondary | ICD-10-CM | POA: Diagnosis present

## 2022-01-11 DIAGNOSIS — N135 Crossing vessel and stricture of ureter without hydronephrosis: Secondary | ICD-10-CM | POA: Diagnosis present

## 2022-01-11 DIAGNOSIS — N179 Acute kidney failure, unspecified: Secondary | ICD-10-CM | POA: Diagnosis present

## 2022-01-11 DIAGNOSIS — N390 Urinary tract infection, site not specified: Secondary | ICD-10-CM

## 2022-01-11 DIAGNOSIS — C679 Malignant neoplasm of bladder, unspecified: Secondary | ICD-10-CM

## 2022-01-11 DIAGNOSIS — A419 Sepsis, unspecified organism: Secondary | ICD-10-CM

## 2022-01-11 LAB — CBC WITH DIFF, BLOOD
ANC-Automated: 15.4 10*3/uL — ABNORMAL HIGH (ref 1.6–7.0)
Abs Basophils: 0 10*3/uL (ref <0.2)
Abs Eosinophils: 0 10*3/uL (ref 0.0–0.5)
Abs Lymphs: 1.3 10*3/uL (ref 0.8–3.1)
Abs Monos: 1.7 10*3/uL — ABNORMAL HIGH (ref 0.2–0.8)
Basophils: 0 %
Eosinophils: 0 %
Hct: 31.6 % — ABNORMAL LOW (ref 40.0–50.0)
Hgb: 11.1 gm/dL — ABNORMAL LOW (ref 13.7–17.5)
Imm Gran %: 1 % — ABNORMAL HIGH (ref <1)
Imm Gran Abs: 0.1 10*3/uL — ABNORMAL HIGH (ref <0.1)
Lymphocytes: 7 %
MCH: 30.2 pg (ref 26.0–32.0)
MCHC: 35.1 g/dL (ref 32.0–36.0)
MCV: 86.1 um3 (ref 79.0–95.0)
MPV: 10.3 fL (ref 9.4–12.4)
Monocytes: 9 %
Plt Count: 258 10*3/uL (ref 140–370)
RBC: 3.67 10*6/uL — ABNORMAL LOW (ref 4.60–6.10)
RDW: 14.8 % — ABNORMAL HIGH (ref 12.0–14.0)
Segs: 83 %
WBC: 18.6 10*3/uL — ABNORMAL HIGH (ref 4.0–10.0)

## 2022-01-11 LAB — BASIC METABOLIC PANEL, BLOOD
Anion Gap: 14 mmol/L (ref 7–15)
BUN: 33 mg/dL — ABNORMAL HIGH (ref 8–23)
Bicarbonate: 21 mmol/L — ABNORMAL LOW (ref 22–29)
Calcium: 8.2 mg/dL — ABNORMAL LOW (ref 8.5–10.6)
Chloride: 95 mmol/L — ABNORMAL LOW (ref 98–107)
Creatinine: 2.89 mg/dL — ABNORMAL HIGH (ref 0.67–1.17)
Glucose: 137 mg/dL — ABNORMAL HIGH (ref 70–99)
Potassium: 4.9 mmol/L (ref 3.5–5.1)
Sodium: 130 mmol/L — ABNORMAL LOW (ref 136–145)
eGFR Based on CKD-EPI 2021 Equation: 23 mL/min

## 2022-01-11 LAB — APTT, BLOOD
PTT: 29 s (ref 27–36)
PTT: 31 s (ref 27–36)

## 2022-01-11 LAB — PROTHROMBIN TIME, BLOOD
INR: 1.2
INR: 1.5
PT,Patient: 13.7 s — ABNORMAL HIGH (ref 9.7–12.5)
PT,Patient: 16.6 s — ABNORMAL HIGH (ref 9.7–12.5)

## 2022-01-11 SURGERY — IR NEPHROSTOMY TUBE
Anesthesia: Local

## 2022-01-11 MED ORDER — AMLODIPINE 10 MG OR TABS
10.0000 mg | ORAL_TABLET | Freq: Every day | ORAL | Status: DC
Start: 2022-01-11 — End: 2022-01-19
  Administered 2022-01-11 – 2022-01-19 (×10): 10 mg via ORAL
  Filled 2022-01-11: qty 1
  Filled 2022-01-11: qty 2
  Filled 2022-01-11 (×3): qty 1
  Filled 2022-01-11: qty 2
  Filled 2022-01-11 (×3): qty 1

## 2022-01-11 MED ORDER — TIZANIDINE HCL 4 MG OR TABS
2.0000 mg | ORAL_TABLET | Freq: Three times a day (TID) | ORAL | Status: DC | PRN
Start: 2022-01-11 — End: 2022-01-19
  Administered 2022-01-11 – 2022-01-16 (×5): 2 mg via ORAL
  Filled 2022-01-11 (×4): qty 1

## 2022-01-11 MED ORDER — LIDOCAINE HCL (PF) 1 % IJ SOLN
INTRAMUSCULAR | Status: AC
Start: 2022-01-11 — End: 2022-01-11
  Filled 2022-01-11: qty 30

## 2022-01-11 MED ORDER — DULOXETINE HCL 20 MG OR CPEP
20.0000 mg | ORAL_CAPSULE | Freq: Every day | ORAL | Status: DC
Start: 2022-01-11 — End: 2022-01-19
  Administered 2022-01-11 – 2022-01-19 (×10): 20 mg via ORAL
  Filled 2022-01-11 (×9): qty 1

## 2022-01-11 MED ORDER — PREGABALIN 100 MG OR CAPS
100.0000 mg | ORAL_CAPSULE | Freq: Every evening | ORAL | Status: DC
Start: 2022-01-11 — End: 2022-01-19
  Administered 2022-01-11 – 2022-01-18 (×10): 100 mg via ORAL
  Filled 2022-01-11 (×9): qty 1

## 2022-01-11 MED ORDER — ALUM & MAG HYDROXIDE-SIMETH 200-200-20 MG/5ML OR SUSP
30.0000 mL | Freq: Four times a day (QID) | ORAL | Status: DC | PRN
Start: 2022-01-11 — End: 2022-01-19

## 2022-01-11 MED ORDER — TAMSULOSIN HCL 0.4 MG PO CAPS
0.4000 mg | ORAL_CAPSULE | Freq: Every day | ORAL | Status: DC
Start: 2022-01-11 — End: 2022-01-19
  Administered 2022-01-11 – 2022-01-19 (×10): 0.4 mg via ORAL
  Filled 2022-01-11 (×9): qty 1

## 2022-01-11 MED ORDER — ALLOPURINOL 100 MG OR TABS
300.0000 mg | ORAL_TABLET | Freq: Every day | ORAL | Status: DC
Start: 2022-01-11 — End: 2022-01-19
  Administered 2022-01-11 – 2022-01-19 (×10): 300 mg via ORAL
  Filled 2022-01-11 (×5): qty 3
  Filled 2022-01-11: qty 1
  Filled 2022-01-11 (×3): qty 3

## 2022-01-11 MED ORDER — IODIXANOL 320 MG/ML IV SOLN
INTRAVENOUS | Status: AC
Start: 2022-01-11 — End: 2022-01-11
  Filled 2022-01-11: qty 50

## 2022-01-11 MED ORDER — SODIUM CHLORIDE 0.9 % IJ SOLN (CUSTOM)
3.0000 mL | Freq: Three times a day (TID) | INTRAMUSCULAR | Status: DC
Start: 2022-01-11 — End: 2022-01-19
  Administered 2022-01-11 – 2022-01-18 (×18): 3 mL via INTRAVENOUS

## 2022-01-11 MED ORDER — FENTANYL CITRATE (PF) 100 MCG/2ML IJ SOLN
INTRAMUSCULAR | Status: AC
Start: 2022-01-11 — End: 2022-01-11
  Filled 2022-01-11: qty 2

## 2022-01-11 MED ORDER — DOCUSATE SODIUM 250 MG OR CAPS
250.0000 mg | ORAL_CAPSULE | Freq: Two times a day (BID) | ORAL | Status: DC
Start: 2022-01-11 — End: 2022-01-19
  Administered 2022-01-11 – 2022-01-19 (×12): 250 mg via ORAL
  Filled 2022-01-11 (×16): qty 1

## 2022-01-11 MED ORDER — SODIUM CHLORIDE 0.9% TKO INFUSION
INTRAVENOUS | Status: DC | PRN
Start: 2022-01-11 — End: 2022-01-19

## 2022-01-11 MED ORDER — SODIUM CHLORIDE 0.9 % IJ SOLN (CUSTOM)
3.0000 mL | INTRAMUSCULAR | Status: DC | PRN
Start: 2022-01-11 — End: 2022-01-19

## 2022-01-11 MED ORDER — VANCOMYCIN 1750 MG IN 500 ML NS PREMIX IVPB
1750.0000 mg | Freq: Once | INTRAVENOUS | Status: AC
Start: 2022-01-11 — End: 2022-01-11
  Administered 2022-01-11 (×2): 1750 mg via INTRAVENOUS
  Filled 2022-01-11: qty 500

## 2022-01-11 MED ORDER — LORAZEPAM 0.5 MG OR TABS
0.5000 mg | ORAL_TABLET | Freq: Four times a day (QID) | ORAL | Status: DC | PRN
Start: 2022-01-11 — End: 2022-01-19

## 2022-01-11 MED ORDER — LIDOCAINE HCL 1 % IJ SOLN
INTRAMUSCULAR | Status: DC | PRN
Start: 2022-01-11 — End: 2022-01-11
  Administered 2022-01-11 (×2): 10 mL via INTRADERMAL

## 2022-01-11 MED ORDER — OXYCODONE HCL 5 MG OR TABS
5.0000 mg | ORAL_TABLET | Freq: Two times a day (BID) | ORAL | Status: DC | PRN
Start: 2022-01-11 — End: 2022-01-12
  Administered 2022-01-11 – 2022-01-12 (×4): 5 mg via ORAL
  Filled 2022-01-11 (×4): qty 1

## 2022-01-11 MED ORDER — VANCOMYCIN PER PHARMACY
INTRAVENOUS | Status: DC
Start: 2022-01-11 — End: 2022-01-12

## 2022-01-11 MED ORDER — FENTANYL CITRATE (PF) 100 MCG/2ML IJ SOLN
INTRAMUSCULAR | Status: DC | PRN
Start: 2022-01-11 — End: 2022-01-11
  Administered 2022-01-11 (×2): 50 ug via INTRAVENOUS

## 2022-01-11 MED ORDER — POLYETHYLENE GLYCOL 3350 OR PACK
17.0000 g | PACK | Freq: Every day | ORAL | Status: DC | PRN
Start: 2022-01-11 — End: 2022-01-19
  Administered 2022-01-11 – 2022-01-13 (×4): 17 g via ORAL
  Filled 2022-01-11 (×3): qty 1

## 2022-01-11 MED ORDER — HEPARIN SODIUM (PORCINE) 5000 UNIT/ML IJ SOLN
5000.0000 [IU] | Freq: Three times a day (TID) | INTRAMUSCULAR | Status: DC
Start: 2022-01-11 — End: 2022-01-19
  Administered 2022-01-11 – 2022-01-19 (×25): 5000 [IU] via SUBCUTANEOUS
  Filled 2022-01-11 (×24): qty 1

## 2022-01-11 SURGICAL SUPPLY — 23 items
APPLICATOR CHLORAPREP 26ML, ~~LOC~~ (Prep Solutions) ×2
BAG DRAINAGE NEPHROSTOMY 600ML (Misc Medical Supply) ×4 IMPLANT
CANNULA NASAL OXYGEN (Misc Medical Supply) ×2
CATHETER DRAIN MULTIPURPOSE MAC-LOC 8.5FR X 25CM (Lines/Drains) ×2
COVER PROBE MICROTEK INTRAOPERATIVE 5" X 96" PC1308 (Drapes/towels) ×2 IMPLANT
DECANTER BAG-A-JET STERILE (Misc Medical Supply) ×2
DILATOR AQ HYDROPHILIC 8FR X 20CM (Procedural wires/sheaths/catheters/balloons/dilators) ×2 IMPLANT
DRAPE SHEET MEDIUM STERILE 40X70IN (Drapes/towels) ×2
DRESSING SPONGE 2X2X4 PLY (Dressings/packing) ×2
DRESSING SPONGE IV 2X2 STRL (Dressings/packing) ×2 IMPLANT
DRESSING TEGADERM HP 4X4 (Dressings/packing) ×2 IMPLANT
DRESSING TEGADERM I.V. ADVANCED 4X4 1688 (Dressings/packing) ×2
GLOVE BIOGEL PI ULTRATOUCH SIZE 6.5 (Gloves/Gowns) ×6
GLOVE BIOGEL PI ULTRATOUCH SIZE 7.5 (Gloves/Gowns) ×8 IMPLANT
GUIDEWIRE AMPLATZ SUPER STIFF 0.035" X 75CM, 7CM TAPER, STRAIGHT (Procedural wires/sheaths/catheters/balloons/dilators) ×2 IMPLANT
GUIDEWIRE STARTER J-CURVED FIXED CORE 0.035" X 150CM, 8CM TAPER, 15MM J TIP (Procedural wires/sheaths/catheters/balloons/dilators) IMPLANT
INTRODUCER ACCUSTICK II (Needles/punch/cannula/biopsy) IMPLANT
INTRODUCER ACCUSTICK II 0.038" J TIP WIRE & 0.018" NITINOL WIRE (Procedural wires/sheaths/catheters/balloons/dilators) ×2
INTRODUCER SHEATH GREBSET 5FR X 30 CM W/70CM GUIDEWIRE (Procedural wires/sheaths/catheters/balloons/dilators)
PREP CHLOROPREP 10.5ML 1-STEP (Prep Solutions)
PROCEDURE PACK - IR NON-VASCULAR (Procedure Packs/kits) ×2 IMPLANT
SUTURE ETHILON 2-0 18" FS (Suture) ×4
TOWELS OR BLUE 4-PACK STERILE, DISPOSABLE (Drapes/towels) ×2 IMPLANT

## 2022-01-11 NOTE — Addendum Note (Signed)
Addended by: Janan Ridge on: 01/11/2022 09:31 AM   Modules accepted: Orders

## 2022-01-11 NOTE — Interdisciplinary (Signed)
01/11/22 1557   Initial Assessment   CM Initial Assessment * Completed   Readmission Risk Assessment   Readmission Within 30 Days of Discharge * No   Recent Hospitalizations (Within Last 6 Months) * Yes   High Risk For Readmission * Yes   Action Taken To Prevent Readmission After Discharge * Other (Comment)   Recommendations to the Physician* To be determined   Patient Information   Where was the patient admitted from? * Home   Address on Facesheet correct?* Yes   Prior to Level of Function * Ambulatory;Use Assisted Devices;Independent with ADL's   Assistive Device * Cane;Walker   Home Care Services * No   Available Assistance/Support System  and Prior Community Resources Family member(s)   Primary Caretaker(s) * Self;Family   Primary Family/Caregiver Contact Name, Number and Relationship * Evert,Pamela ([place])   [phone]   Permission to Contact * Yes   Social Worker Consult   Do you need to see a Education officer, museum? * No   Discharge Planning   Living Arrangements on Admission* Family Member   Type of Residence * One Story Home   Patient's Discharge Goal(s) Home   Anticipated Discharge Disposition/Needs Home with Family   Anticipated DischargeTransportation *  Family/Friend   Anticipated Discharge Transportation Details [name])   [phone] will pick up   Barriers to Discharge * Clinical reason   Patient Engaged in Discharge Planning * Yes   Family/Caregiver's Assessed for * Not Applicable   Respite Care * Not Applicable   Patient/Family/Other Are In Agreement With Discharge Plan * Yes   Vaccinated for COVID - 19 No   Public Health Clearance Needed * No     Medical Necessity:  71 y.o. male with PMH HTN, depression, arthritis, urothelial [place] s/p chemo and surgery, solitary L kidney and chronic indwelling foley presenting with cloudy urine and shakes.    Interpreter Used: no    LACE+ Score: 70      PCP verified:  [name]  [address] / [place] [address]  telephone [phone]  fax  [phone]    Pharmacy:  Networked reference to record PHR     PLOF: Independent/ambulatory with assistive device    SNF Hx & preference:  N/a and no preference  HH Hx & preference:  N/a and no preference  DME Hx & preference: cane, FWW     DISCHARGE PLANNING    Social Support: family    Anticipated DC disposition: home    Anticipated DC needs: none    Anticipated barriers to discharge: clinical improvement, otherwise n/a    Anticipated Transportation @ discharge: sister will pick up            Covid Vax status: unvaccinated    Expected discharge date: pending clinical progression     Darrin Luis, RN   Case Manager

## 2022-01-11 NOTE — Progress Notes (Signed)
Hospital Medicine   Daily Progress Note   Brief Hospital Summary   Stephen [name] is a 71 year old male with h/o HTN, depression, arthritis, solitary L kidney, and urothelial cancer s/p chemo and surgery with chronic foley admitted on 01/10/2022 with sepsis 2/2 CAUTI and AKI.     24 Hour Course/Overnight Events   Admitted and started on ceftriaxone.     Subjective/Review of Systems   Complains of rigors today. Denies significant pain or nausea.     Physical Exam   Temp  Min: 98.3 F (36.8 C)  Max: 100.7 F (38.2 C)  Pulse  Min: 67  Max: 88  BP  Min: 96/43  Max: 153/77  Resp  Min: 14  Max: 22  SpO2  Min: 95 %  Max: 100 %    BP (!) 124/59    Pulse 74    Temp 98.8 F (37.1 C)    Resp 19    Ht 5\' 6"  (1.676 m)    Wt 88.2 kg (194 lb 7.1 oz)    SpO2 100%    BMI 31.38 kg/m  O2 Device: None (Room air) O2 Flow Rate (L/min): 2 l/min    01/23 0600 - 01/24 0559  In: 1120 [P.O.:120; I.V.:1000]  Out: 260 [Urine:260]  Net: 860  Urine x 0 Stool x 0 Emesis x 0     Gen: Awake, lying in bed in NAD  HEENT: MMM  CV: RRR, no M/R/G  Pulm: CTAB, no W/R/R  GI: soft, NT, ND, NABS  Extr: No edema, normal muscle bulk  Skin: Warm, Dry, No Rashes  Neuro: A&Ox3    Data     WBC 18.6* (01/24) HGB 11.1* (01/24) PLT 258 (01/24)    HCT 31.6* (01/24)      Na 130* (01/24) CL 95* (01/24) BUN 33* (01/24) GLU   137* (01/24)   K 4.9 (01/24) CO2 21* (01/24) Cr 2.89* (01/24)      PT 16.6* (01/24) PTT 31 (01/24)   INR 1.5 (01/24)     Inpatient Medications     Inpatient Medications   Scheduled Medications   allopurinol  300 mg Daily    amLODIPINE  10 mg Daily    docusate sodium  250 mg BID    DULoxetine  20 mg Daily    heparin  5,000 Units Q8H    pregabalin  100 mg Nightly    sodium chloride  3 mL Q8H    tamsulosin  0.4 mg Daily    VANCOMYCIN PER PHARMACIST   As Directed      Continuous Medications   cefTRIAXone (ROCEPHIN) IV 1,000 mg (01/11/22 1728)    sodium chloride        PRN Medications   acetaminophen  650 mg Q6H PRN     aluminum-magnesium-simethicone  30 mL Q6H PRN    lidocaine  10 mL Daily PRN    LORazepam  0.5 mg Q6H PRN    oxyCODONE  5 mg BID PRN    polyethylene glycol  17 g Daily PRN    sodium chloride  3 mL PRN    sodium chloride   Continuous PRN    tizanidine  2 mg Q8H PRN                Assessment & Plan   1) Sepsis 2/2 pyelonephritis and CAUTI and E faecalis bacteremia -   - changed ceftriaxone to vancomycin  - obtaining repeat blood cultures  - follow-up urine and  blood culture sensitivities  - foley exchanged on admission    2) AKI 2/2 hydronephrosis 2/2 malignant ureteral obstruction -  - urology consulted  - IR consulted and placed L PCN today  - follow-up repeat Cr after PCN placement    3)Urothelial cancer -  - f/u with urology on 2/7 for treatment planning    4) HTN - cont amlodipine 10mg  daily    5) Gout - cont allopurinol daily, may need to dose adjust if Cr does not quickly improve    6) Chronic back pain - cont duloxetine and [place] [facility] Problems    Diagnosis POA    *Sepsis (CMS-HCC) [A41.9] Yes    Urinary tract infection associated with indwelling urethral catheter (CMS-HCC) [T83.511A, N39.0] Yes    AKI (acute kidney injury) (CMS-HCC) [N17.9] Yes    Ureteral obstruction [N13.5] Yes    Hydronephrosis [N13.30] Yes    Pyelonephritis [N12] Yes    Malignant neoplasm of urinary bladder, unspecified site (CMS-HCC) [C67.9] Yes      Resolved Hospital Problems   No resolved problems to display.      Inpatient Checklist    Disposition Planning 01/11/2022:  Remaining discharge needs: clinical improvement and consultant input (urology)   Discharge location: To be determined  Outpatient Follow-up Needed: Urology, oncology  Estimated Discharge: 24-48 hours    Foley: L PCN  VTE prophylaxis: SQ heparin  Diet: Diet Regular  Last Stool Occurrence:    IV fluids: No IV fluids  Access: PIVs  Level of Care: [place]  Code status: Full Code        Greer Pickerel, MD MPH  Associate Professor  of [facility]  Pager [phone]

## 2022-01-11 NOTE — Progress Notes (Signed)
Vancomycin Initiation Note  Vancomycin Indication: Bloodstream Infection (Goal: Trough 10 - 20 mg/L, AUC 400-600 mg-h/L)  71 year old male, Height: 5\' 6"  (167.6 cm), Weight: 88.2 kg (194 lb 7.1 oz), Kidney Function: SCr: 2.89 mg/dL, AKI.    Assessment / Plan:   Patient with AKI, Scr 2.89 today which is elevated from his baseline of 0.9-1.1.   Give a loading dose of 1750 mg once now and then dose by levels.  Monitor kidney function and plan random level on 1/25 or sooner if clinically indicated.    Pharmacist will continue to monitor and make adjustments as needed.    Levonne Spiller, PharmD, BCCCP  Staff Pharmacist

## 2022-01-11 NOTE — ED Notes (Signed)
Bed: MEDED03  Expected date:   Expected time:   Means of arrival:   Comments:  20

## 2022-01-11 NOTE — Consults (Signed)
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 Left flank and LLQ pain brewing over that period. No n/v. Temp on presentation 101.2, WBC 15, AKI to Cr 2.6 from baseline ~1. No CP/SOB. Does feel more tired and constipated    Currently still with pain though somewhat improved. Remains fatigued.     Blood thinners: denies  Last PO intake:1:30pm 01/11/22; since made NPO      Past Medical and Surgical History:  Past Medical History:   Diagnosis Date    Chronic back pain     Congenital hydronephrosis     Gout     Headache     Hematuria     HTN (hypertension) 12/10/2021    Kidney disease     Kidney stones     Major depressive disorder, single episode     Polyarthropathy or polyarthritis of multiple sites     Retinal detachment     Urethral stricture      Past Surgical History:   Procedure Laterality Date    CT INSERTION OF SUPRAPUBIC CATH  09/25/2015    NEPHRECTOMY Right 1995    APPENDECTOMY      COLONOSCOPY      CYSTOSCOPY      CYSTOSCOPY W/ LASER LITHOTRIPSY      OTHER SURGICAL HISTORY      Interstim 01/29/2011    SPINE SURGERY  09/21    Lumbar-sacral fusion    TRANSURETHRAL RESECTION OF PROSTATE         Allergies:  Allergies   Allergen Reactions    Amoxicillin Rash    Sulfa Drugs Unspecified       Medications:  Current Facility-Administered Medications on File Prior to Encounter   Medication Dose Route Frequency Provider Last Rate Last Admin    lidocaine (UROJET) 2 % topical jelly 10 mL  10 mL Urethral Daily PRN Carlota Raspberry, MD   10 mL at 12/16/21 1133      Current Outpatient Medications on File Prior to Encounter   Medication Sig Dispense Refill    acetaminophen (TYLENOL) 325 MG tablet Take 2 tablets (650 mg) by mouth every 6 hours as needed for Mild Pain (Pain Score 1-3) (alternate with motrin). 30 tablet 0    ALAWAY 0.025 % ophthalmic solution INSTILL 1 DROP INTO BOTH EYES TWICE A DAY AS DIRECTED      albuterol 108 (90 Base) MCG/ACT inhaler Inhale 2 puffs by mouth every 4 hours as needed for Wheezing. 6.7 g 0    allopurinol (ZYLOPRIM) 300 MG tablet Take 300 mg by mouth daily.      amLODIPINE (NORVASC) 5 MG tablet Take 10 mg by mouth daily.      ascorbic acid (VITAMIN C) 500 MG/5ML suspension       benzonatate (TESSALON) 100 MG capsule Take 1 capsule (100 mg) by mouth every 8 hours as needed for Cough. 30 capsule 0    docusate sodium (COLACE) 100 MG capsule Take 1 capsule (100 mg) by mouth 3 times daily for  5 days. 15 capsule 0    docusate sodium (COLACE) 250 MG capsule Take 1 capsule (250 mg) by mouth 2 times daily. 60 capsule 0    DULoxetine (CYMBALTA) 20 MG CR capsule Take 1 capsule (20 mg) by mouth daily. 90 capsule 3    [DISCONTINUED] gabapentin (NEURONTIN) 100 MG capsule Take 1 capsule (100 mg) by mouth 3 times daily for 7 days. Start taking after your procedure is completed. 21 capsule 0    guaiFENesin-dextromethorphan (ROBITUSSIN DM) 100-10 MG/5ML syrup Take 10 mL by mouth every 4 hours as needed for Cough. 560 mL 0    lidocaine (ASPERCREME) 4 % patch Apply 1 patch topically every 24 hours. Leave patch on for 12 hours, then remove for 12 hours. 30 patch 0    menthol (CEPACOL) 3 MG lozenge Take 1 lozenge by mouth every 2 hours as needed for Pain. 60 lozenge 0    naloxone (NARCAN) 4 mg/0.1 mL nasal spray For suspected opioid overdose, call 911! Then spray once in one nostril. Repeat after 3 minutes if no or minimal response using a new spray in other nostril. 2 each 0    ondansetron (ZOFRAN ODT) 8 MG disintegrating tablet Take 1 tablet (8  mg) on or under the tongue every 8 hours as needed for Nausea/Vomiting. 10 tablet 0    oxyCODONE (ROXICODONE) 5 MG immediate release tablet Take 1 tablet (5 mg) by mouth 2 times daily as needed for Moderate Pain (Pain Score 4-6) or Severe Pain (Pain Score 7-10). 30 tablet 0    Phenazopyridine HCl 99.5 MG TABS Take 1 tablet (100 mg) by mouth 3 times daily as needed for burning pain with urination, or urethral pain. 12 tablet 0    Polyethyl Glycol-Propyl Glycol (SYSTANE ULTRA OP)       polyethylene glycol (GLYCOLAX) 17 GM/SCOOP powder Take 17 g by mouth daily. Mix with 4 to 8 ounces of fluid (water, juice, soda, coffee, or tea) prior to administration. 255 g 0    pregabalin (LYRICA) 100 MG capsule TAKE 1 CAPSULE BY MOUTH EVERYDAY AT BEDTIME 30 capsule 0    senna (SENOKOT) 8.6 MG tablet Take 1 tablet (8.6 mg) by mouth daily. 30 tablet 0    tamsulosin (FLOMAX) 0.4 MG capsule Take 1 capsule (0.4 mg) by mouth daily. 30 capsule 3    tizanidine (ZANAFLEX) 2 MG tablet Take 1 tablet (2 mg) by mouth every 8 hours as needed (spasm). 40 tablet 0    triamcinolone (KENALOG) 0.1 % cream Apply 1 Application topically 2 times daily. Apply a thin layer as directed      VITAMIN D PO Take 125 mg by mouth daily.         Social History:  Social History     Socioeconomic History    Marital status: Single   Tobacco Use    Smoking status: Never    Smokeless tobacco: Never   Substance and Sexual Activity    Alcohol use: No    Drug use: Not Currently    Sexual activity: Not Currently     Partners: Female   Social Activities of Daily Living Present    Military Service No    Blood Transfusions Yes    Caffeine Concern No    Occupational Exposure No    Hobby Hazards No    Sleep Concern Yes     Comment: due to my lower back discomfort and leg discomfort    Stress Concern Yes     Comment:  Health/ housing/ financial    Weight Concern No    Special Diet No    Back Care Yes     Comment: Am very careful with any activity     Exercises Regularly Yes    Seat Belt Use Yes    Performs Self-Exams Yes    Bike Helmet Use No       Family History:  Family History   Adopted: Yes   Family history unknown: Yes       ----------------------------------------------------------------------------------------------    Physical Exam:  BP  Min: 96/43  Max: 153/77  Temp  Min: 98.3 F (36.8 C)  Max: 101.2 F (38.4 C)  Pulse  Min: 68  Max: 88  Resp  Min: 14  Max: 22  SpO2  Min: 95 %  Max: 100 %  Height  Min: '5\' 6"'  (167.6 cm)  Max: '5\' 6"'  (167.6 cm)  Weight  Min: 88.2 kg (194 lb 7.1 oz)  Max: 88.2 kg (194 lb 7.1 oz)    01/23 0600 - 01/24 0559  In: 1120 [P.O.:120; I.V.:1000]  Out: 260 [Urine:260]  Chaperone: visible from nursing station  GENERAL: cooperative, tired  PULM: non-labored breathing  GI: soft, NT, ND  BACK: L CVAT+  GU: cloudy urine in foley, mild to no suprapubic tenderness  SKIN: no new significant rashes or lesions  EXT: warm and well perfused.   PSYCH: mood appropriate      Laboratory :  Recent Labs     01/10/22  1557 01/11/22  0322   WBC 15.4* 18.6*   HGB 12.4* 11.1*   HCT 36.3* 31.6*   PLT 297 258   SEG 83 83   LYMPHS 9 7   MONOS 8 9        Recent Labs     01/10/22  1557 01/11/22  0322   NA 133* 130*   K 5.0 4.9   CL 95* 95*   BICARB 22 21*   BUN 29* 33*   CREAT 2.62* 2.89*   GLU 115* 137*   [place] 9.4 8.2*     Recent Labs     01/10/22  1557   ALK 111   AST 30   ALT 11   TBILI 1.15   ALB 4.3          Recent Labs     01/11/22  0322 01/11/22  1407   PT 13.7* 16.6*   PTT 29 31   INR 1.2 1.5       Lab Results   Component Value Date    COLORUA [place] (A) 01/10/2022    APPEARUA Cloudy 01/10/2022    GLUCOSEUA Negative 01/10/2022    BILIUA Negative 01/10/2022    KETONEUA Negative 01/10/2022    SGUA 1.015 01/10/2022    BLOODUA 3+ (A) 01/10/2022    PHUA 6.5 01/10/2022    PROTEINUA 3+ (A) 01/10/2022    UROBILUA Negative 01/10/2022    NITRITEUA Negative 01/10/2022    LEUKESTUA 500 (A) 01/10/2022    WBCUA >50 (A) 01/10/2022    RBCUA >50 (A) 01/10/2022     HYALINEUA >5 (A) 12/01/2021       No results found for: PSA    Lab Results   Component Value Date    CREAT 2.89 (H) 01/11/2022    CREAT 2.62 (H) 01/10/2022    CREAT 1.00 12/06/2021    CREAT 0.92 12/05/2021    CREAT 0.95 12/04/2021    CREAT 1.05 12/03/2021    CREAT 1.17  12/02/2021    CREAT 1.10 12/01/2021    CREAT 1.03 11/12/2021    CREAT 1.04 11/11/2021       Microbiology:   Microbiology Results (last 30 days)     Procedure Component Value - Date/Time    Blood Culture Blood Culture Set Blood [[id number]] Collected: 01/11/22 0945    Lab Status: Preliminary result Specimen: Blood Updated: 01/11/22 1315     Blood Culture Result Culture in progress.    Blood Culture Blood Culture Set Blood [[id number]] Collected: 01/11/22 0940    Lab Status: Preliminary result Specimen: Blood Updated: 01/11/22 1315     Blood Culture Result Culture in progress.    Blood Culture Blood Culture Set Blood [[id number]]  (Abnormal) Collected: 01/10/22 2116    Lab Status: Preliminary result Specimen: Blood Updated: 01/11/22 1211     Blood Culture Result --     Anaerobic Bottle Gram stain:  Gram positive cocci in chains  .  Aerobic Bottle Gram stain:  Gram positive cocci in chains      Urine Culture Urine [[id number]] Collected: 01/10/22 2004    Lab Status: Preliminary result Specimen: Urine Updated: 01/11/22 1130     Urine Culture Result Further report to follow    Blood Culture Blood Culture Set Blood [[id number]]  (Abnormal) Collected: 01/10/22 1558    Lab Status: Preliminary result Specimen: Blood Updated: 01/11/22 0904     Blood Culture Result --     Anaerobic Bottle Gram stain:  Gram positive cocci in pairs and chains  Called to and read back KK:[id number] Jamito RN on 01/11/2022 @ 06:01   By [id number]  Aerobic Bottle Gram stain:  Gram positive cocci in pairs and chains       Blood Culture ENTEROCOCCUS FAECALIS DNA DETECTED  vancomycin resistance DNA not detected  .  This blood culture was tested by the Verigene multiplex  assay for DNA from the  following bacterial targets:  Staphylococcus aureus, S.epidermidis, S.lugdunensis,  other Staph species, Enterococcus faecalis, E.faecium,  Streptococcus agalactiae (Group B), S.pneumoniae,   S.pyogenes (Group A), S.anginosus group, other   Streptococcus species, Listeria species, mecA  (methicillin resistance), and vanA/B (vancomycin  resistance). Negative results do not preclude an   infection caused by other bacteria or yeast not  screened for by this test or a mixed infection.   Results of nucleic acid testing are preliminary and  will be verified by culture results, as detection of  antibiotic resistance genes may not always correspond  to antibiotic resistance phenotype.      Influenza A/B & SARS-CoV-2 PCR Combo for Rapid Response Lab Nasal-Pharyngeal [[id number]] Collected: 01/10/22 1558    Lab Status: Final result Specimen: Nasal-Pharyngeal Updated: 01/10/22 1735     SARS-CoV-2 PCR, RRL Not Detected     Comment: SARS-CoV-2 RNA not detected. A negative   test does NOT completely rule out   infection or preclude the possibility of   recent exposure to SARS-CoV-2 (COVID-19).    This result has been reported to the [facility] and   Epidemiology Unit as required by  [employer],   [place].          Influenza A PCR, RRL Not Detected     Comment: Influenza A virus RNA not detected. This   result should not be used as the sole   basis for making clinical treatment   decisions, and should be used in   conjunction with other clinical  findings.          Influenza B PCR, RRL Not Detected     Comment: Influenza B virus RNA not detected. This   result should not be used as the sole   basis for making clinical treatment   decisions, and should be used in   conjunction with other clinical findings.  ------------------------------------------  This test uses a nucleic acid   amplification and reverse transcriptase   PCR assay for the qualitative detection  of  nucleic acids from Influenza A,   Influenza B, and SARS-CoV-2 (COVID-19)   viruses. This assay has received   Emergency Use Authorization from the [name]   Food and Drug Administration (FDA). This   test was developed and its performance   characteristics determined by [facility]. This  test is for clinical purposes and should   not be regarded as investigational or for  research use. [facility] are certified under  the Clinical Laboratory Improvement   Amendments (CLIA) as qualified to perform  high complexity laboratory testing.         Narrative:      Collect using a flocked swab in the designated M4RT viral  transport medium. Other swab types or VTM tubes will be  rejected.  Is the patient a [employer] employee?->No    Anaerobic Culture Sterile Container Kidney, Left [[id number]] Collected: 12/24/21 1124    Lab Status: Final result Specimen: Calculus from Kidney, Left Updated: 12/31/21 8938     Anaerobic Culture Result No Anaerobes isolated    Fungus Culture Sterile Container Kidney, Left [[id number]] Collected: 12/24/21 1124    Lab Status: Preliminary result Specimen: Calculus from Kidney, Left Updated: 01/05/22 0606     Fungus Culture Result --     Culture in progress.  No fungal growth to date      General Aerobic Culture, OR Only Sterile Container Kidney, Left [[id number]] Collected: 12/24/21 1124    Lab Status: Final result Specimen: Calculus from Kidney, Left Updated: 12/26/21 1946     Gram Stain Result --     Rare white blood cells  No organisms seen       General Aerobic Culture, OR Only No Growth    Urine Culture Urine [[id number]] Collected: 12/16/21 0930    Lab Status: Final result Specimen: Urine Updated: 12/18/21 0946     Urine Culture Result No Growth          Radiology:  Personally reviewed RBUS and CT. Proximal stent curl in pelvis, distla curl looks to be entereing prostatic urethra. Foley balloon in correct position. 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 Stent appears to be in position that should be draining, concerning for stent failure. Unclear why given only 3 weeks since placement and bladder decompressed with foley.    Recommend:  -urgent IR decompression with PCN. Spoke to IR team this PM. Antegrade nephrostogram low yield with stent.   -maintain  NPO  -INR 1.5, PTT 16; unclear why slightly elevated as not on anticoagulation  -maintain foley until urology follow up  -maintain stent, will remove electively in future once PCN in place  -daily BCx; culture specific abx  -trend Cr, WBC  -dispo: has 01/25/22 outpt urology follow up for his upper tract recurrence on 12/22/24 (path not definitive but had clearly visible ureteral small papillary tumor)    ---  Gigi Gin. Donnie Aho, MD [place] [facility]       Urology pagers  Please do not hesitate to contact the appropriate service with any questions or concerns.  Prudence Davidson ([facility]): [phone]  [place]: [phone]

## 2022-01-11 NOTE — Consults (Signed)
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 Stephen [name] is a 71 year old male with a history of solitary left kidney, left UTUC s/p laser ablation and Jelmyto URS/TUR on 12/22/21 with stent placement at that time, who presented with flank pain, found to be febrile to 101.2 with worsening AKI and concern for urosepsis. VIR consulted for urgent PCN placement given appropriate positioning of ureteral stent on CT and persistent hydronephrosis concerning for pyonephrosis.    Past Medical History: See admission history and physical.    Current Facility-Administered Medications   Medication    acetaminophen (TYLENOL) tablet 650 mg    allopurinol (ZYLOPRIM) tablet 300 mg    aluminum-magnesium-simethicone (MAG-AL PLUS) 200-200-20 MG/5ML suspension 30 mL    amLODIPINE (NORVASC) tablet 10 mg    cefTRIAXone (ROCEPHIN) 1,000 mg in sodium chloride 0.9 % 50 mL IVPB    docusate sodium (COLACE) capsule 250 mg    DULoxetine (CYMBALTA) CR capsule 20 mg    heparin injection 5,000 Units    lidocaine (UROJET) 2 % topical jelly 10 mL    LORazepam (ATIVAN) tablet 0.5 mg    oxyCODONE (ROXICODONE) tablet 5 mg    polyethylene glycol (MIRALAX) packet 17 g    pregabalin (LYRICA) capsule 100 mg    sodium chloride 0.9 %  flush 3 mL    sodium chloride 0.9 % flush 3 mL    sodium chloride 0.9 % TKO infusion    tamsulosin (FLOMAX) capsule 0.4 mg    tiZANidine (ZANAFLEX) tablet 2 mg    VANCOMYCIN PER PHARMACIST     Current Outpatient Medications   Medication Sig    acetaminophen (TYLENOL) 325 MG tablet Take 2 tablets (650 mg) by mouth every 6 hours as needed for Mild Pain (Pain Score 1-3) (alternate with motrin).    ALAWAY 0.025 % ophthalmic solution INSTILL 1 DROP INTO BOTH EYES TWICE A DAY AS DIRECTED    albuterol 108 (90 Base) MCG/ACT inhaler Inhale 2 puffs by mouth every 4 hours as needed for Wheezing.    allopurinol (ZYLOPRIM) 300 MG tablet Take 300 mg by mouth daily.    amLODIPINE (NORVASC) 5 MG tablet Take 10 mg by mouth daily.    ascorbic acid (VITAMIN C) 500 MG/5ML suspension     benzonatate (TESSALON) 100 MG capsule Take 1 capsule (100 mg) by mouth every 8 hours as needed for Cough.    docusate sodium (COLACE) 100 MG capsule Take 1 capsule (100 mg) by mouth 3 times daily for 5 days.    docusate sodium (COLACE) 250 MG capsule Take 1 capsule (250 mg) by mouth 2 times daily.    DULoxetine (CYMBALTA) 20 MG CR capsule Take  1 capsule (20 mg) by mouth daily.    guaiFENesin-dextromethorphan (ROBITUSSIN DM) 100-10 MG/5ML syrup Take 10 mL by mouth every 4 hours as needed for Cough.    lidocaine (ASPERCREME) 4 % patch Apply 1 patch topically every 24 hours. Leave patch on for 12 hours, then remove for 12 hours.    menthol (CEPACOL) 3 MG lozenge Take 1 lozenge by mouth every 2 hours as needed for Pain.    naloxone (NARCAN) 4 mg/0.1 mL nasal spray For suspected opioid overdose, call 911! Then spray once in one nostril. Repeat after 3 minutes if no or minimal response using a new spray in other nostril.    ondansetron (ZOFRAN ODT) 8 MG disintegrating tablet Take 1 tablet (8 mg) on or under the tongue every 8 hours as needed for Nausea/Vomiting.    oxyCODONE (ROXICODONE) 5 MG immediate release tablet Take 1 tablet (5 mg)  by mouth 2 times daily as needed for Moderate Pain (Pain Score 4-6) or Severe Pain (Pain Score 7-10).    Phenazopyridine HCl 99.5 MG TABS Take 1 tablet (100 mg) by mouth 3 times daily as needed for burning pain with urination, or urethral pain.    Polyethyl Glycol-Propyl Glycol (SYSTANE ULTRA OP)     polyethylene glycol (GLYCOLAX) 17 GM/SCOOP powder Take 17 g by mouth daily. Mix with 4 to 8 ounces of fluid (water, juice, soda, coffee, or tea) prior to administration.    pregabalin (LYRICA) 100 MG capsule TAKE 1 CAPSULE BY MOUTH EVERYDAY AT BEDTIME    senna (SENOKOT) 8.6 MG tablet Take 1 tablet (8.6 mg) by mouth daily.    tamsulosin (FLOMAX) 0.4 MG capsule Take 1 capsule (0.4 mg) by mouth daily.    tizanidine (ZANAFLEX) 2 MG tablet Take 1 tablet (2 mg) by mouth every 8 hours as needed (spasm).    triamcinolone (KENALOG) 0.1 % cream Apply 1 Application topically 2 times daily. Apply a thin layer as directed    VITAMIN D PO Take 125 mg by mouth daily.       Physical Exam:   Temperature:  [98.3 F (36.8 C)-100.7 F (38.2 C)] 98.8 F (37.1 C) (01/24 1300)  Blood pressure (BP): (96-153)/(43-77) 120/45 (01/24 1511)  Heart Rate:  [67-88] 67 (01/24 1511)  Respirations:  [14-22] 17 (01/24 1511)  Pain Score: 8 (01/24 1143)  O2 Device: None (Room air) (01/24 1511)  SpO2:  [95 %-100 %] 96 % (01/24 1511)     Laboratory data:   Lab Results   Component Value Date    INR 1.5 01/11/2022    PTT 31 01/11/2022     Lab Results   Component Value Date    WBC 18.6 (H) 01/11/2022    HGB 11.1 (L) 01/11/2022    HCT 31.6 (L) 01/11/2022    PLT 258 01/11/2022    LYMPHS 7 01/11/2022     Lab Results   Component Value Date    NA 130 (L) 01/11/2022    K 4.9 01/11/2022    CL 95 (L) 01/11/2022    BICARB 21 (L) 01/11/2022    BUN 33 (H) 01/11/2022    CREAT 2.89 (H) 01/11/2022    GLU 137 (H) 01/11/2022    Marvin 8.2 (L) 01/11/2022     Lab Results   Component Value Date    AST 30 01/10/2022    ALT 11 01/10/2022    ALK 111 01/10/2022    TBILI 1.15  01/10/2022    TP 7.2 01/10/2022  ALB 4.3 01/10/2022         Relevant Imaging:  CT Abdomen/Pelvis 01/11/22:      ASSESSMENT & RECOMMENDATIONS     71 year old male with solitary left kidney with obstruction and UTI. Current stent not function.    - Plan urgent left PCN, local anesthetic only    Case discussed with VIR attending physician Dr. Loree Fee.    Gweneth Dimitri, MD, PGY-5  Resident Physician, Interventional Radiology  [phone]    Interventional Radiology Pagers:    Working Hours (M-F, 7a-5p)  [place]  [place]  [facility] [phone]    After Hours (M-F, 5p-7a and weekends)   [facility]: [phone]

## 2022-01-11 NOTE — Interdisciplinary (Signed)
Received report from Katharine Look, [place] for patietn going to [facility]

## 2022-01-11 NOTE — Telephone Encounter (Signed)
Patient sent a MyChart due to lower back pain.  Presented to the ED yesterday with a dx of pyelonephritis.    Currently admitted to the hospital.  Matter resolved

## 2022-01-11 NOTE — Interdisciplinary (Signed)
Physical Therapy Evaluation and Discharge    Admitting Physician:  Meryl Crutch, MD  Admission Date 01/10/2022    Inpatient Diagnosis:   Problem List       Codes    Sepsis, due to unspecified organism, unspecified whether acute organ dysfunction present (CMS-HCC)     ICD-10-CM: A41.9  ICD-9-CM: 038.9, 995.91    AKI (acute kidney injury) (CMS-HCC)     ICD-10-CM: N17.9  ICD-9-CM: 584.9    Decreased functional mobility     ICD-10-CM: R26.89  ICD-9-CM: 781.99          IP Start of Service   Start of Care: 01/11/22  Onset Date: 01/10/22  Reason for referral: Decline in functional ability/mobility    Preferred Language:English         Past Medical History:   Diagnosis Date    Chronic back pain     Congenital hydronephrosis     Gout     Headache     Hematuria     HTN (hypertension) 12/10/2021    Kidney disease     Kidney stones     Major depressive disorder, single episode     Polyarthropathy or polyarthritis of multiple sites     Retinal detachment     Urethral stricture       Past Surgical History:   Procedure Laterality Date    CT INSERTION OF SUPRAPUBIC CATH  09/25/2015    NEPHRECTOMY Right 1995    APPENDECTOMY      COLONOSCOPY      CYSTOSCOPY      CYSTOSCOPY W/ LASER LITHOTRIPSY      OTHER SURGICAL HISTORY      Interstim 01/29/2011    SPINE SURGERY  09/21    Lumbar-sacral fusion    TRANSURETHRAL RESECTION OF PROSTATE          PT Acute     Row Name 01/11/22 1300          Type of Visit    Type of Physical Therapy note Physical Therapy Evaluation and Discharge     [place] Name 01/11/22 1300          Treatment Precautions/Restrictions    Precautions/Restrictions Fall     Fall Socks/charm     Row Name 01/11/22 1300          Medical History    History of presenting condition Patient is a 71 year old male admitted with pyelonephritis     Fall history No falls reported in the last 6 months     Other Past Medical History Information HTN, depression, arthritis, urothelial cancer s/p chemo and surgery,  solitary L kidney, chronic indswelling foley     Row Name 01/11/22 1300          Functional History    Prior Level of Function Minimal deficits     Equipment required for mobility in the home Cane;Walker     Other Functional History Information primarily uses [facility], also owns [place] Name 01/11/22 1300          Social History    Living Situation Lives with parent/family     [place] accessibility  Accessible with wheelchair or walker     Other Social History Information lives with sister who can provide assist     [place] Name 01/11/22 1300          Subjective    Subjective Information Patient reports that he has  walked with the cane around the room     Patient status Patient agreeable to treatment;Nursing in agreement for treatment     Row Name 01/11/22 1300          Pain Assessment    Pain Asssessment Tool Numeric Pain Rating Scale     Row Name 01/11/22 1300          Numeric Pain Rating Scale    Pain Intensity - rating at present 5     Pain Intensity- rating after treatment 5     Location back     Row Name 01/11/22 1300          Objective    Overall Cognitive Status At baseline level     Other  Cognitive Status Information decreased safety awareness, impulsive at times     Communication No communication limitations or impairments noted. Current status of hearing, speech and vision allow functional communication.     Coordination/Motor control No limitations or impairments noted. Movement patterns are fluid and coordinated throughout     Balance Balance limitations present     Static Sitting Balance Good - able to maintain balance without handhold support, limited postural sway     Dynamic Sitting Balance Fair - accepts minimal challenge, able to maintain balance while turning head/trunk     Static Standing Balance Good - able to maintain balance without handhold support, limited postural sway     Dynamic Standing Balance Fair - accepts minimal challenge, able to maintain balance while turning  head/trunk     Other Balance Information standing balance with BUE support on FWW, fair static standing balance with unilateral UE support on [facility]     Extremity Assessment Range of motion, strength,  muscle tone and/or sensation limitations present     LLE findings knee/ankle grossly 4 to 4-/5 strength, hip flex 3+/5     RLE findings knee/ankle grossly 4 to 4-/5 strength, hip flex 3+/5     Functional Mobility Functional mobility deficits present     Bed Mobility Modified independent     Bed Mobility Comments OOB towards patient's R, HOB elevated     Transfers to/from Stand Supervised     Transfer Comments from bed with [facility]     Gait Supervised;Other (comments)  SBA     Gait Comments SBA with SPC with shuffling gait and decreased cadence, improved to supervision with FWW     Device used for ambulation/mobility Cane;Front wheeled walker     Ambulation Distance 200 feet with SPC, 200 feet with FWW     Other Objective Findings Patient presented supine in bed, agreeable to PT. Patient performed mobility as described above, initially with cane, then with FWW. Patient demonstrated improved gait stability with use of FWW, recommended to use FWW instead of SPC to minimize fall risk, patient expressed understanding. Patient returned to bed and left with needs met.                      Eval cont.     [place] Name 01/11/22 1300          Patient/Family Education    Learner(s) Patient     Learner response to rehab patient education interventions Verbalizes understanding     Patient/family training comments role of PT, safe mobility, minimizing fall risk, appropriate AD     Row Name 01/11/22 1300          Assessment    Assessment Patient is a 71 year  old male admitted with pyelonephritis. Patient presents slightly below his functional baseline, limited by impaired balance. Patient demonstrates ability to use FWW safely with improvement in gait stability, recommended to use FWW instead of SPC to minimize fall risk. No further IP PT needs  at this time. Recommend DC home with assist when appropriate.     [place] Name 01/11/22 1300          Patient stated Goal    Patient stated goal none stated     [place] Name 01/11/22 1300          Treatment Plan Disussion    Treatment Plan Discussion and Agreement Patient/family/caregiver stated understanding and agreement with the therapy plan     Row Name 01/11/22 1300          Treatment Plan    Frequency of treatment Patient appropriate for discharge from therapy     Duration of treatment (number of visits) One time only, further treatment not indicated     Status of treatment One time only treatment, further skilled therapy not indicated     [place] Name 01/11/22 1300          Patient Safety Considerations    Patient safety considerations Patient returned to bed at end of treatment     Patient assistive device requirements for safe ambulation [place] Name 01/11/22 1300          Therapy Plan Communication    Therapy Plan Communication Discussed therapy plan with Nursing and/or Physician;Encouraged out of bed with assistance by     Encouraged out of bed with assistance by Nursing;Staff     Row Name 01/11/22 1300          Physical Therapy Patient Discharge Instructions    Your Physical Therapist suggests the following Supervision with walking is suggested for increased safety;Continue to use your assistive device as instructed when walking to improve your stability and prevent falls     Row Name 01/11/22 1300          Type of Eval    Low Complexity [phone]) Completed     Row Name 01/11/22 1300          Therapeutic Procedures    Gait Training [phone]) Assistive device training;Dynamic activities while walking;Gait pattern analysis and treatment of deviations;Patient education        Total TIMED Treatment (min)  25     Row Name 01/11/22 1300          Treatment Time     Total TIMED Treatment  (min) --  25     Total Treatment Time (min) 40     Treatment start time 1105               Post Acute Discharge  Recommendations  Discharge Rehabilitation Recommendations : If available, recommend discharge to supervised living situation  Level of Care : Home with assitance  Current Level of Assistance : Supervised  Equipment recommendations: No equipment needed - patient has own equipment    The physical therapist of record is endorsed by evaluating physical therapist.

## 2022-01-11 NOTE — Anesthesia Postprocedure Evaluation (Signed)
Anesthesia Post Note    Patient: Stephen [name]    Procedure(s) Performed: Procedure(s):  Left ureteroscopy, biopsy, with laser fulgration, stone removal, TURBT, cytology washings, insertion of ureteral stent      Final anesthesia type: General    Patient location: PACU    Post anesthesia pain: adequate analgesia    Mental status: awake, alert  and oriented    Airway Patent: Yes    Last Vitals:   Vitals Value Taken Time   BP 112/63 12/24/21 1500   Temp 36.5 C 12/24/21 1500   Pulse 84 12/24/21 1500   Resp 14 12/24/21 1500   SpO2 98 % 12/24/21 1500        Post vital signs: stable    Hydration: adequate    N/V:no    Anesthetic complications: no    Plan of care per primary team.

## 2022-01-11 NOTE — Brief Op Note (Signed)
BRIEF OPERATIVE NOTE    DATE: 01/11/2022  TIME: 5:04 PM    PREOPERATIVE DIAGNOSIS: Left obstructive uropathy, sepsis    POSTOPERATIVE DIAGNOSIS: Same    PROCEDURE INFORMATION:  Procedure(s):  IR NEPHROSTOMY TUBE - Wound Class: N/A - Incision Closure: No Incision / NA    ATTENDING SURGEON:   Surgeon(s) and Role:     * Baili Stang, MD - Primary    ANESTHESIA: Anxiolysis    FINDINGS: Technically successful image-guided placement of left percutaneous nephrostomy tube    SPECIMENS: 10 mL cloudy urine sent to lab for microbiology    Fluids/Blood Products:      IV Fluids: None    Blood Products: None    EBL: Minimal    Urine Output: None    COMPLICATIONS: None immediate    DISPOSITION:  1. Return to inpatient room  2. Follow-up urine cultures  3. IR will manage PCN; please contact with questions

## 2022-01-12 ENCOUNTER — Inpatient Hospital Stay (HOSPITAL_COMMUNITY): Payer: Medicare Other

## 2022-01-12 DIAGNOSIS — R918 Other nonspecific abnormal finding of lung field: Secondary | ICD-10-CM

## 2022-01-12 DIAGNOSIS — J189 Pneumonia, unspecified organism: Secondary | ICD-10-CM

## 2022-01-12 DIAGNOSIS — R6520 Severe sepsis without septic shock: Secondary | ICD-10-CM

## 2022-01-12 DIAGNOSIS — A4181 Sepsis due to Enterococcus: Secondary | ICD-10-CM

## 2022-01-12 DIAGNOSIS — R4182 Altered mental status, unspecified: Secondary | ICD-10-CM

## 2022-01-12 LAB — BASIC METABOLIC PANEL, BLOOD
Anion Gap: 15 mmol/L (ref 7–15)
BUN: 41 mg/dL — ABNORMAL HIGH (ref 8–23)
Bicarbonate: 22 mmol/L (ref 22–29)
Calcium: 8.9 mg/dL (ref 8.5–10.6)
Chloride: 96 mmol/L — ABNORMAL LOW (ref 98–107)
Creatinine: 3.21 mg/dL — ABNORMAL HIGH (ref 0.67–1.17)
Glucose: 127 mg/dL — ABNORMAL HIGH (ref 70–99)
Potassium: 4.1 mmol/L (ref 3.5–5.1)
Sodium: 133 mmol/L — ABNORMAL LOW (ref 136–145)
eGFR Based on CKD-EPI 2021 Equation: 20 mL/min

## 2022-01-12 LAB — HEMOGRAM, BLOOD
Hct: 30.8 % — ABNORMAL LOW (ref 40.0–50.0)
Hgb: 10.3 gm/dL — ABNORMAL LOW (ref 13.7–17.5)
MCH: 29.6 pg (ref 26.0–32.0)
MCHC: 33.4 g/dL (ref 32.0–36.0)
MCV: 88.5 um3 (ref 79.0–95.0)
MPV: 10.1 fL (ref 9.4–12.4)
Plt Count: 239 10*3/uL (ref 140–370)
RBC: 3.48 10*6/uL — ABNORMAL LOW (ref 4.60–6.10)
RDW: 14.8 % — ABNORMAL HIGH (ref 12.0–14.0)
WBC: 14.5 10*3/uL — ABNORMAL HIGH (ref 4.0–10.0)

## 2022-01-12 LAB — MAGNESIUM, BLOOD: Magnesium: 2.1 mg/dL (ref 1.6–2.4)

## 2022-01-12 LAB — URINE CULTURE: Urine Culture Result: <10000 — AB

## 2022-01-12 LAB — MRSA SURVEILLANCE CULTURE

## 2022-01-12 LAB — VANCOMYCIN, RANDOM: Vancomycin, Random: 11.2 ug/mL

## 2022-01-12 LAB — PHOSPHORUS, BLOOD: Phosphorous: 4.3 mg/dL (ref 2.7–4.5)

## 2022-01-12 MED ORDER — SODIUM CHLORIDE 0.9 % IV SOLN
2000.0000 mg | INTRAVENOUS | Status: DC
Start: 2022-01-12 — End: 2022-01-12

## 2022-01-12 MED ORDER — ACETAMINOPHEN 325 MG PO TABS
650.0000 mg | ORAL_TABLET | Freq: Four times a day (QID) | ORAL | Status: DC | PRN
Start: 2022-01-12 — End: 2022-01-19
  Administered 2022-01-12 – 2022-01-14 (×5): 650 mg via ORAL
  Filled 2022-01-12 (×5): qty 2

## 2022-01-12 MED ORDER — SODIUM CHLORIDE 0.9 % IV SOLN
8.0000 mg/kg | INTRAVENOUS | Status: DC
Start: 2022-01-12 — End: 2022-01-14
  Administered 2022-01-12 (×2): 700 mg via INTRAVENOUS
  Filled 2022-01-12: qty 700

## 2022-01-12 MED ORDER — SODIUM CHLORIDE 0.9 % IV SOLN
2000.0000 mg | Freq: Two times a day (BID) | INTRAVENOUS | Status: DC
Start: 2022-01-12 — End: 2022-01-19
  Administered 2022-01-12 – 2022-01-19 (×15): 2000 mg via INTRAVENOUS
  Filled 2022-01-12 (×14): qty 2000

## 2022-01-12 MED ORDER — VANCOMYCIN HCL 1.5 G IV SOLR
1500.0000 mg | Freq: Once | INTRAVENOUS | Status: DC
Start: 2022-01-12 — End: 2022-01-12
  Filled 2022-01-12: qty 1500

## 2022-01-12 MED ORDER — OXYCODONE HCL 5 MG OR TABS
5.0000 mg | ORAL_TABLET | Freq: Two times a day (BID) | ORAL | Status: DC | PRN
Start: 2022-01-12 — End: 2022-01-19
  Administered 2022-01-13 – 2022-01-19 (×8): 5 mg via ORAL
  Filled 2022-01-12 (×7): qty 1

## 2022-01-12 MED ORDER — LACTATED RINGERS IV SOLN
Freq: Once | INTRAVENOUS | Status: AC
Start: 2022-01-12 — End: 2022-01-13

## 2022-01-12 NOTE — Progress Notes (Signed)
Pharmacokinetics Note - Vancomycin  Vancomycin Indication: Bloodstream Infection (Goal: Trough 10 - 20 mg/L, AUC 400-600 mg-h/L), started 1/24  Vancomycin level: 11.2 mg/L on 1/25 at 0542, random, not at steady state  Kidney Function: SCr: 3.21 mg/dL, changed from baseline, worsening      Assessment / Plan:   Pharmacokinetic Parameters: Volume of distribution: 51.3 L, Clearance: 2.78 L/h, Half-life: 12.8 h  Renal function continues to worsen, unable to determine appropriate scheduled doe at this time. Pt level likely < 10 mcg/mL at this time.   Will redose with vancomycin 1500mg  x once. Monitor kidney function and plan random level on 1/26 or sooner if clinically indicated.    Pharmacist will continue to monitor and make adjustments as needed.    Estelle Grumbles, [place]

## 2022-01-12 NOTE — Consults (Signed)
INFECTIOUS DISEASES INPATIENT CONSULT   Date:  01/12/22   Requesting Physician: Oneita Kras, MD  Reason for Consultation: E faecalis bacteremia  ID Attending:  Dr. 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     History obtained from chart. Patient very altered during this encounter. Awake but answers few questions.     Pt recently underwent L ureteroscopy with biopsy, laser fulgration, stone removal and TURBT with insertion of a ureteral stent on 12/24/21. He subsequently developed flank pain and fevers a few days prior to presentation.    On presentation, he was febrile to 101.2. VS otherwise WNL. Labs were notable for a leukocytosis to 15.4 and AKI (Cr 2.62 from BL of 1). He was started on ceftriaxone and vancomycin. IR was consulted and placed a L PCN on 1/24.    Blood cultures and urine cultures from admission subsequently grew E faecalis. He referred last night to 102.8.     Today, his WBC is 14.5 down from 18.6 yesterday. E faecalis susceptibilties have returned (sensitive to amp and vanc).     Pt has a listed amoxicillin allergy; however, he is unable to provide details of this allergy given his current cognitive status. Per his nurse, he was more alert and communicative this morning.     Antibiotics:   Ceftriaxone 1/23-  Vancomycin 1/24-    Past Medical and Surgical History:  Past Medical History:   Diagnosis Date    Chronic back pain     Congenital hydronephrosis     Gout     Headache     Hematuria     HTN (hypertension) 12/10/2021    Kidney disease     Kidney stones     Major depressive disorder, single episode     Polyarthropathy or polyarthritis of multiple sites     Retinal detachment     Urethral stricture      Past  Surgical History:   Procedure Laterality Date    CT INSERTION OF SUPRAPUBIC CATH  09/25/2015    NEPHRECTOMY Right 1995    APPENDECTOMY      COLONOSCOPY      CYSTOSCOPY      CYSTOSCOPY W/ LASER LITHOTRIPSY      OTHER SURGICAL HISTORY      Interstim 01/29/2011    SPINE SURGERY  09/21    Lumbar-sacral fusion    TRANSURETHRAL RESECTION OF PROSTATE           Allergies   Allergen Reactions    Amoxicillin Rash    Sulfa Drugs Unspecified       Social History:   Social History     Socioeconomic History    Marital status: Single   Tobacco Use    Smoking status: Never    Smokeless tobacco: Never   Substance and Sexual Activity    Alcohol use: No    Drug use: Not Currently    Sexual activity: Not Currently     Partners: Female   Social Activities of Daily Living Present    Military Service No    Blood Transfusions Yes    Caffeine Concern No    Occupational Exposure No  Hobby Hazards No    Sleep Concern Yes     Comment: due to my lower back discomfort and leg discomfort    Stress Concern Yes     Comment: Health/ housing/ financial    Weight Concern No    Special Diet No    Back Care Yes     Comment: Am very careful with any activity    Exercises Regularly Yes    Seat Belt Use Yes    Performs Self-Exams Yes    Bike Helmet Use No       Family History:   Family History   Adopted: Yes   Family history unknown: Yes       Physical Exam:   01/11/22  2011 01/11/22  2136 01/11/22  2353 01/12/22  0309   BP: 113/62   (!) 100/42   Pulse: 75   55   Resp: 20   18   Temp: (!) 102.8 F (39.3 C) (!) 101.6 F (38.7 C) 98.6 F (37 C) 98.3 F (36.8 C)   SpO2: 93%   98%       GEN: Pt laying in bed, diaphoretic, awake but not answering questions appropriately  HEENT: EOMI. No scleral icterus. MMM.  CV: RRR. Normal S1 and S2. No murmurs, rubs, or gallops.   PULM: Normal WOB on room air. CTA bilaterally anteriorly. No rales, rhonchi, or wheezes.   ABD: Normoactive bowel sounds. Soft, non-tender, non-distended. PCN  arising from L flank without surrounding erythema. Draining clear urine.   MSK: Warm, well perfused. No joint erythema or tenderness. No splinter hemorrhages.  SKIN: Warm, dry. No obvious rashes, lesions, or jaundice  NEURO: Face symmetric. Not answering questions appropriately.    Patient Lines/Drains/Airways Status     Active PICC Line / CVC Line / PIV Line / Drain / Airway / Intraosseous Line / Epidural Line / ART Line / Line Type / Wound / Pressure Ulcer Injury     Name Placement date Placement time Site Days    Peripheral IV - 20 G Right Antecubital 01/10/22  1558  Antecubital  1    Nephrostomy -  Left #1 01/11/22  1648  Left  less than 1    Indwelling Urinary Catheter -  01/10/22 RN Latex free 16 fr  Yes 01/10/22  1836  Latex free  1    Stent -  Ureteral (left) 12/24/21  1102  Ureteral (left)  19                Labs:   Recent Labs     01/10/22  1557 01/11/22  0322 01/12/22  0542   NA 133* 130* 133*   K 5.0 4.9 4.1   CL 95* 95* 96*   BICARB 22 21* 22   BUN 29* 33* 41*   CREAT 2.62* 2.89* 3.21*   [place] 9.4 8.2* 8.9   MG  --   --  2.1   PHOS  --   --  4.3   TP 7.2  --   --    ALB 4.3  --   --    TBILI 1.15  --   --    AST 30  --   --    ALT 11  --   --    ALK 111  --   --        Recent Labs     01/10/22  1557 01/11/22  0322 01/12/22  0542   WBC 15.4* 18.6* 14.5*  HGB 12.4* 11.1* 10.3*   HCT 36.3* 31.6* 30.8*   MCV 86.6 86.1 88.5   PLT 297 258 239   SEG 83 83  --    LYMPHS 9 7  --    MONOS 8 9  --    EOS 0 0  --        Recent Labs     01/11/22  0322 01/11/22  1407   PTT 29 31   INR 1.2 1.5       Microbiologic data:  Blood  1/23 - 4/4 E faecalis  1/24 - GPCs in pairs and chains in 1/4 bottles  1/25 - CIP    Respiratory  1/24 MRSA nares - negative    Urine  1/23 - >100k E faecalis (R gent, S amp, vanc MIC 1)  1/24 L nephrostomy - >100k E faecalis     Imaging:  CT Abd/Pelvis 1/23  moderate left hydronephrosis, unchanged compared to 04/22/2021 CT urography.    Renal US 1/23  Moderate left hydronephrosis, mildly increased  compared to prior. Partially visualized left nephroureteral stent in the left renal pelvis.    Status post right nephrectomy.    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     #High grade E faecalis bacteremia  #Pyelonephritis  #Solitary L kidney, upper tract urothelial cancer  #AKI  Septic on presentation with E faecalis bacteremia and E faecalis growing from urine. S/p PCN on 1/24. Re-fevered last night and very altered today. Recommend double coverage of Enterococcus for empiric endocarditis treatment while awaiting additional work-up. No stigmata of endocarditis on exam. Amox allergy listed but unable to obtain details from pt at this time.   - Stop vancomycin, start daptomycin and increase ceftriaxone dose to 2g q12 hours   - Appreciate pharmacy's a/w dapto dosing in setting of AKI   - Check CPK with am labs, monitor weekly while on dapto  - CT head and CT chest non-con to eval for septic emboli and for etiology of AMS  - TTE  - Cont daily blood cultures until 1 set is negative for 48 hours    Recommendations discussed with primary team. We will cont to follow.     Patient seen and examined with ID attending, Dr. Gabriel Carina, who agrees with the above assessment and plan.     Louis Matte, MD  Fellow, Division of Infectious Diseases  [facility]

## 2022-01-12 NOTE — ED Notes (Signed)
At approx 1925 the pt was en route to CT and aspirated water and began to choke on the stretcher. Pt then began to get restless and cough profusely, unable to be redirected, kept asking, "where's my cell phone". Pt O2 at that time was 87% on room air, 2L NC was placed on the pt and his O2 status went to 92%. HR 95. Pt has been afebrile and declining mentation throughout the day. MD paged, and notified and requested he be moved out of MEDED overflow for better monitoring of sepsis.

## 2022-01-12 NOTE — ED Notes (Signed)
Bed: 27  Expected date:   Expected time:   Means of arrival:   Comments:  MED[facility] 12

## 2022-01-12 NOTE — Progress Notes (Signed)
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    Now s/p L PCN placement by IR on 1/24      Subjective/Events:   Febrile overnight, having chills  Pain well controlled  No n/v  Not ambulating  L flank pain improved  PCN and foley draining CYU    Scheduled Meds   allopurinol  300 mg Daily    amLODIPINE  10 mg Daily    docusate sodium  250 mg BID    DULoxetine  20 mg Daily    heparin  5,000 Units Q8H    pregabalin  100 mg Nightly    sodium chloride  3 mL Q8H    tamsulosin  0.4 mg Daily    VANCOMYCIN PER PHARMACIST   As Directed     PRN Meds   acetaminophen  650 mg Q6H PRN    aluminum-magnesium-simethicone  30 mL Q6H PRN    lidocaine  10 mL Daily PRN    LORazepam  0.5 mg Q6H PRN    oxyCODONE  5 mg BID PRN    polyethylene glycol  17 g Daily PRN    sodium chloride  3 mL PRN    sodium chloride   Continuous PRN    tizanidine  2 mg Q8H PRN     IV Meds   sodium chloride           Objective:  Vital Signs:   Latest Entry  Range (last 24 hours)    Temperature: 98.3 F (36.8 C)  Temp  Avg: 99.8 F (37.7 C)  Min: 98.3 F (36.8 C)  Max: 102.8 F (39.3 C)    Blood pressure (BP): (!) 100/42  BP  Min: 96/43  Max: 132/68    Heart Rate: 55  Pulse  Avg: 72.4  Min: 55  Max: 86    Respirations: 18  Resp  Avg: 18.6  Min: 17  Max: 21    SpO2: 98 %  SpO2  Avg: 97.1 %  Min: 93 %  Max: 100 %        No data recorded       Intake/Output (Last day):  01/24 0600 - 01/25 0559  In: 1080 [P.O.:1080]  Out: 2815 [Urine:2815]      Physical Exam:  Chaperone: visible from nursing station  GENERAL: cooperative, tired  PULM: non-labored breathing  GI: soft, NT, ND  BACK: L PCN draining CYU  GU: cloudy urine in foley, mild to no suprapubic tenderness  SKIN: no new significant rashes or lesions  EXT: warm and well perfused.   PSYCH: mood appropriate      Laboratory data:   Recent Labs     01/10/22  1557 01/11/22  0322 01/12/22  0542   WBC 15.4* 18.6* 14.5*   RBC 4.19* 3.67* 3.48*   HGB 12.4*  11.1* 10.3*   HCT 36.3* 31.6* 30.8*   MCV 86.6 86.1 88.5   MCHC 34.2 35.1 33.4   RDW 14.9* 14.8* 14.8*   PLT 297 258 239   MPV 9.7 10.3 10.1   SEG 83 83  --    LYMPHS 9 7  --    MONOS 8 9  --    EOS 0 0  --    BASOS 0 0  --      Recent Labs     01/10/22  1557 01/11/22  0322 01/12/22  0542   NA 133* 130* 133*   K 5.0 4.9 4.1   CL 95* 95* 96*   BICARB 22 21* 22   BUN 29* 33* 41*   CREAT 2.62* 2.89* 3.21*   GLU 115* 137* 127*   [place] 9.4 8.2* 8.9   ALB 4.3  --   --    MG  --   --  2.1   PHOS  --   --  4.3     Recent Labs     01/10/22  1557   TBILI 1.15   TP 7.2   AST 30   ALK 111   ALT 11     Recent Labs     01/11/22  0322 01/11/22  1407   PT 13.7* 16.6*   PTT 29 31   INR 1.2 1.5       Lab Results   Component Value Date    COLORUA [place] II (A) 01/10/2022    APPEARUA Cloudy 01/10/2022    GLUCOSEUA Negative 01/10/2022    BILIUA Negative 01/10/2022    KETONEUA Negative 01/10/2022    SGUA 1.015 01/10/2022    BLOODUA 3+ (A) 01/10/2022    PHUA 6.5 01/10/2022    PROTEINUA 3+ (A) 01/10/2022    UROBILUA Negative 01/10/2022    NITRITEUA Negative 01/10/2022    LEUKESTUA 500 (A) 01/10/2022    WBCUA >50 (A) 01/10/2022    RBCUA >50 (A) 01/10/2022    HYALINEUA >5 (A) 12/01/2021       Microbiology:  Microbiology Results (last 30 days)     Procedure Component Value - Date/Time    Blood Culture Blood Culture Set Blood [[id number]] Collected: 01/11/22 0945    Lab  Status: Preliminary result Specimen: Blood Updated: 01/11/22 1315     Blood Culture Result Culture in progress.    Blood Culture Blood Culture Set Blood [[id number]]  (Abnormal) Collected: 01/11/22 0940    Lab Status: Preliminary result Specimen: Blood Updated: 01/12/22 0237     Blood Culture Result --     Anaerobic Bottle Gram stain:  Gram positive cocci in pairs and chains      Blood Culture Blood Culture Set Blood [[id number]]  (Abnormal) Collected: 01/10/22 2116    Lab Status: Preliminary result Specimen: Blood Updated: 01/11/22 1211     Blood Culture Result --     Anaerobic Bottle Gram stain:  Gram positive cocci in chains  .  Aerobic Bottle Gram stain:  Gram positive cocci in chains      Urine Culture Urine [[id number]] Collected: 01/10/22 2004    Lab Status: Preliminary result Specimen: Urine Updated: 01/11/22 1130     Urine Culture Result Further report to follow    Blood Culture Blood Culture Set Blood [[id number]]  (Abnormal) Collected: 01/10/22 1558    Lab Status: Preliminary result Specimen: Blood Updated: 01/11/22 0904     Blood Culture Result --     Anaerobic Bottle Gram stain:  Gram positive cocci in pairs and chains  Called to and read back YI:[id number] Jamito RN on 01/11/2022 @ 06:01   By OEV:[id number]  Aerobic Bottle Gram stain:  Gram positive cocci in pairs and chains       Blood Culture ENTEROCOCCUS FAECALIS DNA DETECTED  vancomycin resistance DNA not detected  .  This blood culture was tested by the Verigene multiplex  assay for DNA from the following bacterial targets:  Staphylococcus aureus, S.epidermidis, S.lugdunensis,  other Staph species, Enterococcus faecalis, E.faecium,  Streptococcus agalactiae (Group B), S.pneumoniae,   S.pyogenes (Group A), S.anginosus group, other   Streptococcus species, Listeria species, mecA  (methicillin resistance), and vanA/B (vancomycin  resistance). Negative results do not preclude an   infection caused by other bacteria or yeast not  screened for by this test or a  mixed infection.   Results of nucleic acid testing are preliminary and  will be verified by culture results, as detection of  antibiotic resistance genes may not always correspond  to antibiotic resistance phenotype.      Influenza A/B & SARS-CoV-2 PCR Combo for Rapid Response Lab Nasal-Pharyngeal [[id number]] Collected: 01/10/22 1558    Lab Status: Final result Specimen: Nasal-Pharyngeal Updated: 01/10/22 1735     SARS-CoV-2 PCR, RRL Not Detected     Comment: SARS-CoV-2 RNA not detected. A negative   test does NOT completely rule out   infection or preclude the possibility of   recent exposure to SARS-CoV-2 (COVID-19).    This result has been reported to the [facility] and   Epidemiology Unit as required by  [employer],   [place].          Influenza A PCR, RRL Not Detected     Comment: Influenza A virus RNA not detected. This   result should not be used as the sole   basis for making clinical treatment   decisions, and should be used in   conjunction with other clinical findings.          Influenza B PCR, RRL Not Detected     Comment: Influenza B virus RNA not detected. This   result should not be used as the sole   basis for making clinical treatment   decisions, and should be used in   conjunction with other clinical findings.  ------------------------------------------  This test uses a nucleic acid   amplification and reverse transcriptase   PCR assay for the qualitative detection of  nucleic acids from Influenza A,   Influenza B, and SARS-CoV-2 (COVID-19)   viruses. This assay has received   Emergency Use Authorization from the [name]   Food and Drug Administration (FDA). This   test was developed and its performance   characteristics determined by [facility]. This  test is for clinical purposes and should   not be regarded as investigational or for  research use. [facility] are  certified under  the Clinical Laboratory Improvement   Amendments (CLIA) as qualified to perform  high complexity laboratory testing.         Narrative:      Collect using a flocked swab in the designated M4RT viral  transport medium. Other swab types or VTM tubes will be  rejected.  Is the patient a [employer] employee?->No    Anaerobic Culture Sterile Container Kidney, Left [[id number]] Collected: 12/24/21 1124    Lab  Status: Final result Specimen: Calculus from Kidney, Left Updated: 12/31/21 0623     Anaerobic Culture Result No Anaerobes isolated    Fungus Culture Sterile Container Kidney, Left [[id number]] Collected: 12/24/21 1124    Lab Status: Preliminary result Specimen: Calculus from Kidney, Left Updated: 01/12/22 0606     Fungus Culture Result --     Culture in progress.  No fungal growth to date  No fungal growth to date      General Aerobic Culture, OR Only Sterile Container Kidney, Left [[id number]] Collected: 12/24/21 1124    Lab Status: Final result Specimen: Calculus from Kidney, Left Updated: 12/26/21 1946     Gram Stain Result --     Rare white blood cells  No organisms seen       General Aerobic Culture, OR Only No Growth    Urine Culture Urine [[id number]] Collected: 12/16/21 0930    Lab Status: Final result Specimen: Urine Updated: 12/18/21 0946     Urine Culture Result No Growth          Imaging:  CT AP 1/25  IMPRESSION:  CT scan of the abdomen and pelvis without IV contrast.    moderate left hydronephrosis, unchanged compared to 04/22/2021 CT urography.        Assessment and Plan:  Jarome Trull is a 71 year old male w/ UCC and solitary kidney with AKI, urosepsis and hydronephrosis with stent in place now s/p IR Left PCN placement on 1/24    Febrile overnight, yellow urine out of PCN, foley catheter in place. Would recommend broadening antibiotics while patient is still febrile if not improving. Recommend placing SCDS and DVT ppx given lower extremity swelling     Patient seen with team on rounds and  to be discussed w/ attending, Dr. Sallyanne Kuster.      ---  Stanton Kidney, MD  Urology, PGY-2  [facility]    Urology pagers  Please do not hesitate to contact the appropriate service with any questions or concerns.  Prudence Davidson ([facility]): [phone]  [place]: [phone]

## 2022-01-12 NOTE — Interdisciplinary (Signed)
01/12/22 1503   Assessment   Assessment Type Initial   Referral Information   Referral Type Discharge Planning   Social Assessment   Where was the patient admitted from? * Home   Prior to Level of Function * Use Assisted Devices  (Pt utilizes a cane to ambulate)   Assistive Device * Manufacturing engineer) * Self   Primary Family/Caregiver Contact Name, Number and Relationship * Pt sister, [name]: [phone]   Is Patient Minor? No   Advance Directive Health Care Information Given to Patient   (Unclear if pt has a notarized AD form as pt could not recall)   Interpreter Used? Not Needed   Education Not a Student   Social Determinants of Health   Living Arrangements on Admission* Family Member   Available Assistance/Support System * Family member(s)   Patient's Discharge Goal(s) Home   Has discharge transport been arranged? Yes  (Family will likely provide a ride upon d/c)   Civil engineer, contracting   Patient Engaged in Discharge Planning * No   [place] PCP  (Pt claims of being established with a PCP but could not recall their name)   Medication Compliance Adheres to medication  (Pt shared that he has regular access to his meds and he usually manages them)   Involvement with [place] [employer] History Not Applicable   Mental Health Assessment   Past Mental Health Issues Pt could not answer the questions   Mental Status Impaired Memory/Concentration   Behavioral Assessment Other (Comment)  (Unable to answer some of the questions asked by SW)   Physical Assessment Other (Comment)  (Did not assess at this time)   Mental Status - Orientation A&O X3, place, time and event   Plans/Interventions/Discharge   Plan/Interventions Other (comment)  (TBD)   Anticipated Discharge Destination Unable to determine at this time     SW initial assessment    Impression: SW met with pt at bed side and introduced self and role: pt is lying in bed and  agreed to speak to SW. During the assessment, pt was unable to answer many of SW questions. Pt kept staring at the ceiling and it took SW a few attempts each question to have pt partially engaged in the conversation.   Pt was A&O to person, place and time (day only. Pt was unable to verbalize the year and the month). Pt was unable to verbalized who the President is and said "who knows these days?"    Information: pt is a single, [place] speaking, Caucasian, 71 y/o male who was admitted 2/2 shaking and cloudy urine. PMH includes: HTN, depression, arthritis, urothelial  s/p chemo and surgery, solitary L kidney and chronic indwelling foley.    Pt lives with his sister, [name], and her spouse, Simona Huh. Pt was using a cane before the admission and states he was able to drive. Pt receives Social Security income.     Due to the fact that pt was unable to complete the assessment, SW thought appropriate contacting his sister to collect collateral and relevant information.   [name] was called and shared the following information.    Pt has been living with her, her spouse, and their daughter for 3 years. Before that, he was living with a woman he met online and he almost became homeless so his sister took him in.   He does receive Social Security income  as his sole form of income and he's the only one with access to his finances.   To Pamela's knowledge, pt does not have an Advanced healthcare directives form.     Pt does not drive his car, he has not for a while. He takes the bus; [name] was unsure if he gets lost or not but, so far, he has always returned home.     Pt appears to be managing his medications; however, [name] shared that he goes to the pharmacy "often" and she's not sure if his compliant with the medications or if he takes more than he needs.   He's very reserved about his healthcare to the point that, once, he returned home from a procedure [name] did not know he had.     Lastly, [name] shared that he  has been forgetful and he often "talks to himself." It's unclear if pt was hallucinating or not. This, along with the memory issues, has been an "ongoing problem" per [name].     Plan  - SW shared the above with the attending MD, Dr. Lyndel Safe, and requested a Psych eval to determine capacity   - SW will cont to follow as needed          Rober Minion  Ph: [phone]  Pager: [phone]  Weekend/holiday SW pager: [phone]

## 2022-01-12 NOTE — Progress Notes (Signed)
[facility]  2 Days 1 Hour    Subjective/Interval History:  Patient spiked fever overnight.  Unable to provide relevant history.  He was mostly sleepy responding to verbal cues at times.  Blood cultures positive for staph epi ID consulted.     Objective:    Vital Signs:    01/12/22  1500 01/12/22  1503 01/12/22  1617 01/12/22  1810   BP: 119/53   (P) 119/53   Pulse: 76   (P) 76   Resp: 18   (P) 16   Temp: 100 F (37.8 C)      SpO2: (!) 87% 93% 93% (P) 92%       Physical Exam:  Vitals reviewed  GEN:  Sleepy seldom arousable  NECK -  supple, ROM intact  Resp - No inc WOB  CVS - RRR, S1, S2 +  Abd - soft, NT/ND, +BS  Neuro - limited exam, CT but arousable moving all extremities.  Psych - Oriented x2 (self, place) responded after multiple verbal stimuli.    Laboratory data:   Lab Results   Component Value Date    NA 133 (L) 01/12/2022    K 4.1 01/12/2022    CL 96 (L) 01/12/2022    BICARB 22 01/12/2022    BUN 41 (H) 01/12/2022    CREAT 3.21 (H) 01/12/2022    GLU 127 (H) 01/12/2022    [place] 8.9 01/12/2022     Lab Results   Component Value Date    WBC 14.5 (H) 01/12/2022    HGB 10.3 (L) 01/12/2022    HCT 30.8 (L) 01/12/2022    PLT 239 01/12/2022    SEG 83 01/11/2022    LYMPHS 7 01/11/2022    MONOS 9 01/11/2022    EOS 0 01/11/2022     No results found for: AST, ALT, ALK, TBILI, DBILI, TP, ALB  Lab Results   Component Value Date    INR 1.5 01/11/2022    PTT 31 01/11/2022     No results found for: CPK, CKMBH, TROPONIN  Lab Results   Component Value Date    PHUA 6.5 01/10/2022    SGUA 1.015 01/10/2022    GLUCOSEUA Negative 01/10/2022    KETONEUA Negative 01/10/2022    BLOODUA 3+ (A) 01/10/2022    PROTEINUA 3+ (A) 01/10/2022    LEUKESTUA 500 (A) 01/10/2022    NITRITEUA Negative 01/10/2022    WBCUA >50 (A) 01/10/2022    RBCUA >50 (A) 01/10/2022       Assessment and Plan:    #Sepsis d/t  pyelonephritis and CAUTI and E faecalis bacteremia  # acute encephalopathy, metabolic   - d/c vanco ( 1/02)   -  repeat blood cultures daily until clear  - ID consult  - IV daptomycin and ceftriaxone (1/25-)   -CT scan of head and chest without contrast to evaluate for septic emboli    # AKI hydronephrosis d/t malignant ureteral obstruction and sepsis. CR worse today 1-->2.62--> 2.89--> 3.21  - urology consulted  - IR placed L PCN 1/24    # Urothelial cancer -  - f/u with urology on 2/7 for treatment planning    #HTN - cont amlodipine 62m daily    # Gout - cont allopurinol daily, may need to dose adjust if Cr does not quickly improve    # Chronic back pain - cont duloxetine and lyrica    Disposition Planning=  Remaining discharge needs: clinical  improvement and consultant input (urology)   Discharge location: To be determined  Outpatient Follow-up Needed: Urology, oncology  Estimated Discharge: > 72 hrs    Foley: L PCN  VTE prophylaxis: SQ heparin  Diet: Diet Regular  Last Stool Occurrence:    Level of Care: [place] + tele  Code status: Full Code  Guarded prognosis     DOS: 01/12/22    I independently examined the patient, reviewed radiology, EKG (as available) and previous records.     Oneita Kras, MD, [facility] Medicine  PID # [id number]

## 2022-01-12 NOTE — Progress Notes (Signed)
VASCULAR & INTERVENTIONAL RADIOLOGY INPATIENT PROGRESS NOTE      ID: 71 year old male who is post-procedure day 1 status-post left PCN placement.    Overnight/Interval: Febrile to 39.3.    Subjective: Feels improved today. Constipated.    Objective:  Vitals:  Temperature:  [98.3 F (36.8 C)-102.8 F (39.3 C)] 98.3 F (36.8 C) (01/25 0309)  Blood pressure (BP): (96-132)/(42-68) 100/42 (01/25 0309)  Heart Rate:  [55-86] 55 (01/25 0309)  Respirations:  [17-21] 18 (01/25 0309)  Pain Score: NA (fever) (01/25 0551)  O2 Device: None (Room air) (01/25 0309)  O2 Flow Rate (L/min):  [2 l/min] 2 l/min (01/24 1628)  SpO2:  [93 %-100 %] 98 % (01/25 0309)    24hr i/o's:  Intake/Output                       01/11/22 0600 - 01/12/22 0559 01/12/22 0600 - 01/13/22 0559     [id number] 7902-4097 Total 0600-1759 3532-9924 Total                 Intake    P.O.  600  480 1080  --  -- --    Total Intake [phone] -- -- --       Output    Urine  865  1950 2815  --  -- --    Total Output 865 1950 2815 -- -- --       Net I/O     -265 -1470 -1735 -- -- --          Patient Lines/Drains/Airways Status     Active PICC Line / CVC Line / PIV Line / Drain / Airway / Intraosseous Line / Epidural Line / ART Line / Line Type     Name Placement date Placement time Site Days    Peripheral IV - 20 G Right Antecubital 01/10/22  1558  Antecubital  1    Nephrostomy -  Left #1 01/11/22  1648  Left  less than 1    Indwelling Urinary Catheter -  01/10/22 RN Latex free 16 fr  Yes 01/10/22  1836  Latex free  1    Stent -  Ureteral (left) 12/24/21  1102  Ureteral (left)  18                Physical exam:   Gen: NAD  Pulm: Unlabored breathing  Ext: WWP  Abd: Left PCN in place draining clear yellow urine.    Scheduled meds   allopurinol  300 mg Daily    amLODIPINE  10 mg Daily    docusate sodium  250 mg BID    DULoxetine  20 mg Daily    heparin  5,000 Units Q8H    pregabalin  100 mg Nightly    sodium chloride  3 mL Q8H    tamsulosin  0.4 mg Daily     VANCOMYCIN PER PHARMACIST   As Directed     IV meds   sodium chloride       PRN meds   acetaminophen  650 mg Q6H PRN    aluminum-magnesium-simethicone  30 mL Q6H PRN    lidocaine  10 mL Daily PRN    LORazepam  0.5 mg Q6H PRN    oxyCODONE  5 mg BID PRN    polyethylene glycol  17 g Daily PRN    sodium chloride  3 mL PRN    sodium chloride   Continuous PRN  tizanidine  2 mg Q8H PRN       Labs:   Recent Labs     01/10/22  1557 01/11/22  0322   NA 133* 130*   K 5.0 4.9   CL 95* 95*   BICARB 22 21*   BUN 29* 33*   CREAT 2.62* 2.89*   [place] 9.4 8.2*   TP 7.2  --    ALB 4.3  --    TBILI 1.15  --    AST 30  --    ALT 11  --    ALK 111  --       Recent Labs     01/10/22  1557 01/11/22  0322   WBC 15.4* 18.6*   HGB 12.4* 11.1*   HCT 36.3* 31.6*   MCV 86.6 86.1   PLT 297 258   SEG 83 83   LYMPHS 9 7   MONOS 8 9   EOS 0 0      Recent Labs     01/11/22  0322 01/11/22  1407   PTT 29 31   INR 1.2 1.5       Assessment:  71 year old male who is post-procedure day 1 status-post left PCN placement.    Recommendations:  - PCN to gravity  - Follow-up with IR in 8-12 weeks for routine PCN exchange (ordered)    Case will be discussed with VIR attending physician Dr. Thomasena Edis.    Gweneth Dimitri, MD, PGY-5  Resident Physician, Interventional Radiology  [phone]    Interventional Radiology Pagers:    Working Hours (M-F, 7a-5p)  [place]  [facility] - [phone]    After Hours (M-F, 5p-7a and weekends)   All Hospitals: [phone]

## 2022-01-12 NOTE — Progress Notes (Signed)
Vascular and Interventional Radiology Progress Note     Reason For Encounter: Stephen [name] is a 71 year old male s/p IR PCN drain placement(s)    Subjective:  Pt still feeling shaky, no pain complaints regarding the nephrostomy tube.    Physical Exam:  Temperature:  [98.3 F (36.8 C)-102.8 F (39.3 C)] 98.3 F (36.8 C) (01/25 0309)  Blood pressure (BP): (96-124)/(42-65) 100/42 (01/25 0309)  Heart Rate:  [55-75] 55 (01/25 0309)  Respirations:  [17-21] 18 (01/25 0309)  Pain Score: 7 (01/25 0854)  O2 Device: None (Room air) (01/25 0309)  O2 Flow Rate (L/min):  [2 l/min] 2 l/min (01/24 1628)  SpO2:  [93 %-100 %] 98 % (01/25 0309)  BP (!) 100/42 (BP Location: Right arm, BP Patient Position: Semi-Fowlers)    Pulse 55    Temp 98.3 F (36.8 C)    Resp 18    Ht _0  (1.676 m)    Wt 88.2 kg (194 lb 7.1 oz)    SpO2 98%    BMI 31.38 kg/m   01/24 0600 - 01/25 0559  In: 1080 [P.O.:1080]  Out: 2815 [Urine:2815]      GENERAL: no acute distress, answers appropriately  CHEST: non-labored respirations  ABDOMEN: soft non-tender, no flank pain, clear urine draining into collection bag from L PCN.     Labs:  Lab Results   Component Value Date    WBC 14.5 (H) 01/12/2022    HGB 10.3 (L) 01/12/2022    HCT 30.8 (L) 01/12/2022    PLT 239 01/12/2022    LYMPHS 7 01/11/2022     Lab Results   Component Value Date    INR 1.5 01/11/2022    PTT 31 01/11/2022     Lab Results   Component Value Date    NA 133 (L) 01/12/2022    K 4.1 01/12/2022    CL 96 (L) 01/12/2022    BICARB 22 01/12/2022    BUN 41 (H) 01/12/2022    CREAT 3.21 (H) 01/12/2022    GLU 127 (H) 01/12/2022    [place] 8.9 01/12/2022     Lab Results   Component Value Date    AST 30 01/10/2022    ALT 11 01/10/2022    ALK 111 01/10/2022    TBILI 1.15 01/10/2022    TP 7.2 01/10/2022    ALB 4.3 01/10/2022       Micro  pending    Imaging        ASSESSMENT AND PLAN  71 year old year old with 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    Recommendations:  In-house:  -  Follow outputs. Follow up cultures.  - Continue routine nephrostomy care.      If discharged home:  -Continue nephrostomy care at home  -Record outputs.   -May be discharged with tube in place. Please place discharge referral to IR for routine drain exchange in 8-12 weeks     PT INSTRUCTIONS:   1. Empty bag into the toilet as needed and maintain a daily log of the amount emptied.  2. Change site dressing every three days, or earlier if needed.  3. NO tub bath or submersion. May cover drain site with plastic for showering.  4. Secure the bag at ALL TIMES to the patient, using safety pins to clothing or belt. DO NOT secure drainage bag to the bed or linens  5. Call the hospital operator at [phone] and ask for the IR resident on  call for bleeding, swelling, or leakage at the drain site, or new onset of bleeding in drainage bag.   6. The tubes will need to be evaluated every 8-12 weeks. VIR Scheduling will call you to set up this appointment.     Discussed case with VIR attending: Dr. Haig Prophet Esbeydi Manago PA-C  Vascular and Interventional Radiology    Interventional Radiology Pagers:     Working Hours (M-F, 7a-5p)  [place]  [place]  [facility] [phone]     After Hours (M-F, 5p-7a and weekends)   All Hospitals: [phone]

## 2022-01-13 ENCOUNTER — Encounter (HOSPITAL_BASED_OUTPATIENT_CLINIC_OR_DEPARTMENT_OTHER): Payer: Self-pay | Admitting: Student in an Organized Health Care Education/Training Program

## 2022-01-13 DIAGNOSIS — N200 Calculus of kidney: Secondary | ICD-10-CM

## 2022-01-13 LAB — COMPREHENSIVE METABOLIC PANEL, BLOOD
ALT (SGPT): 11 U/L (ref 0–41)
AST (SGOT): 19 U/L (ref 0–40)
Albumin: 2.8 g/dL — ABNORMAL LOW (ref 3.5–5.2)
Alkaline Phos: 73 U/L (ref 40–129)
Anion Gap: 13 mmol/L (ref 7–15)
BUN: 28 mg/dL — ABNORMAL HIGH (ref 8–23)
Bicarbonate: 22 mmol/L (ref 22–29)
Bilirubin, Tot: 0.46 mg/dL (ref <1.2)
Calcium: 8.4 mg/dL — ABNORMAL LOW (ref 8.5–10.6)
Chloride: 100 mmol/L (ref 98–107)
Creatinine: 1.77 mg/dL — ABNORMAL HIGH (ref 0.67–1.17)
Glucose: 121 mg/dL — ABNORMAL HIGH (ref 70–99)
Potassium: 3.6 mmol/L (ref 3.5–5.1)
Sodium: 135 mmol/L — ABNORMAL LOW (ref 136–145)
Total Protein: 5.6 g/dL — ABNORMAL LOW (ref 6.0–8.0)
eGFR Based on CKD-EPI 2021 Equation: 41 mL/min

## 2022-01-13 LAB — CBC WITH DIFF, BLOOD
ANC-Automated: 3.8 10*3/uL (ref 1.6–7.0)
Abs Basophils: 0 10*3/uL (ref <0.2)
Abs Eosinophils: 0 10*3/uL (ref 0.0–0.5)
Abs Lymphs: 0.5 10*3/uL — ABNORMAL LOW (ref 0.8–3.1)
Abs Monos: 0.4 10*3/uL (ref 0.2–0.8)
Basophils: 0 %
Eosinophils: 0 %
Hct: 28.9 % — ABNORMAL LOW (ref 40.0–50.0)
Hgb: 9.3 gm/dL — ABNORMAL LOW (ref 13.7–17.5)
Lymphocytes: 11 %
MCH: 28.4 pg (ref 26.0–32.0)
MCHC: 32.2 g/dL (ref 32.0–36.0)
MCV: 88.4 um3 (ref 79.0–95.0)
MPV: 10.4 fL (ref 9.4–12.4)
Monocytes: 8 %
Plt Count: 215 10*3/uL (ref 140–370)
RBC: 3.27 10*6/uL — ABNORMAL LOW (ref 4.60–6.10)
RDW: 14.8 % — ABNORMAL HIGH (ref 12.0–14.0)
Segs: 81 %
WBC: 4.7 10*3/uL (ref 4.0–10.0)

## 2022-01-13 LAB — LACTATE, BLOOD: Lactate: 0.9 mmol/L (ref 0.5–2.0)

## 2022-01-13 LAB — URINE CULTURE

## 2022-01-13 LAB — BLOOD CULTURE: Blood Culture: DETECTED — AB

## 2022-01-13 LAB — PROCALCITONIN, BLOOD: Procalcitonin: 1.73 ng/mL — ABNORMAL HIGH (ref 0.00–0.08)

## 2022-01-13 LAB — CPK-CREATINE PHOSPHOKINASE, BLOOD: CPK: 287 U/L — ABNORMAL HIGH (ref 0–175)

## 2022-01-13 NOTE — Progress Notes (Signed)
DAILY PROGRESS NOTE      Current Hospital  2 Days 14 Hours    Subjective/Interval History:  Afebrile since yesterday.  Patient feeling not better sitting up and interactive.  Discussed with Infectious Disease that recommended consulting Allergy and immunology for desensitization from penicillin allergy.  After extensive review of records is recommended to try 10% dose challenge with penicillin.  Discussed infectious disease and that nodule recommendations with the patient.  Also noted that he has bacteremia and may need extended course of antibiotics to 6 weeks.  Patient was anxious about the long duration however was understanding of the fact.  Also noted that he may need transesophageal echocardiogram if transthoracic echo is negative.  Total time spent more than 35 minutes, with 50% face-to-face counseling and coordination of care.    Objective:    Vital Signs:    01/12/22  2238 01/12/22  2323 01/13/22  0143 01/13/22  0400   BP:  (!) '96/45 97/53 94/54 '   Pulse:  64 50 51   Resp:       Temp: 98.5 F (36.9 C) 98.8 F (37.1 C)  98.7 F (37.1 C)   SpO2:  92% 93% 92%       Physical Exam:  Vitals reviewed  GEN:  AAO x3, NAD  Resp - No inc WOB  CVS - RRR, S1, S2 +  Abd - soft, NT/ND, +BS  Neuro - no acute focal neuro deficits  Psych - Oriented x3    Laboratory data:   Lab Results   Component Value Date    NA 135 (L) 01/13/2022    K 3.6 01/13/2022    CL 100 01/13/2022    BICARB 22 01/13/2022    BUN 28 (H) 01/13/2022    CREAT 1.77 (H) 01/13/2022    GLU 121 (H) 01/13/2022    [place] 8.4 (L) 01/13/2022     Lab Results   Component Value Date    WBC 4.7 01/13/2022    HGB 9.3 (L) 01/13/2022    HCT 28.9 (L) 01/13/2022    PLT 215 01/13/2022    SEG 81 01/13/2022    LYMPHS 11 01/13/2022    MONOS 8 01/13/2022    EOS 0 01/13/2022     Lab Results   Component Value Date    AST 19 01/13/2022    ALT 11 01/13/2022    ALK 73 01/13/2022    TBILI 0.46 01/13/2022    TP 5.6 (L) 01/13/2022    ALB 2.8 (L) 01/13/2022     Lab Results   Component  Value Date    INR 1.5 01/11/2022    PTT 31 01/11/2022     Lab Results   Component Value Date    CPK 287 (H) 01/13/2022     No results found for: PHUA, [place], [place], KETONEUA, [place], [place], [place], [place], [place], RBCUA, EPITHCELLSUA, CRYSTALSUA, COMMENTSUA    Assessment and Plan:    #Sepsis d/t  pyelonephritis and CAUTI and E faecalis bacteremia  # acute encephalopathy, metabolic - resolving  - WBC improved now WNL  - d/ced vanco ( 1/25)   - repeat blood cultures daily until clear  - ID consulted  - IV daptomycin and ceftriaxone (1/25-)   - Allergy consulted: rec challenge with 10% dose of ampicillin --> 90%   -CT scan of head and chest without contrast to evaluate for septic emboli> multifocal PNA but no septic emboli   - TTE if neg may need TEE    #  AKI hydronephrosis d/t malignant ureteral obstruction and sepsis. CR worse today 1-->2.62--> 2.89--> 3.21--> 1.77 ( unclear if sudden drop is lab error)  - urology following  - IR placed L PCN 1/24  - D/c foley 1/26    # Urothelial cancer -  - f/u with urology on 2/7 for treatment planning    #HTN - cont amlodipine 91m daily    # Gout - cont allopurinol daily, may need to dose adjust if Cr does not quickly improve    # Chronic back pain - cont duloxetine and lyrica    # Concern for Schizophrenia:   - Psych c/s though with acute illness, assessment may be affected     Disposition Planning  Remaining discharge needs: clinical improvement and consultant input (ID)   Discharge location: To be determined  Outpatient Follow-up Needed: Urology, oncology  Estimated Discharge: > 72 hrs    Foley: L PCN  VTE prophylaxis: SQ heparin  Diet: Diet Regular  Code status: Full Code  Guarded prognosis     DOS: 01/13/22    I independently examined the patient, reviewed radiology, EKG (as available) and previous records.     VOneita Kras MD, [facility]  PID # [id number]

## 2022-01-13 NOTE — Consults (Signed)
Allergy/Immunology New Patient Telemedicine Consultation    Referring MD: Oneita Kras, MD    Reason for consultation: concern for amoxicillin allergy    Chief complaint: amoxicillin allergy label    HPI: Stephen [name] is a 71 year old year old male w/ PMHx/o solitary L kidney and L upper tract urothelial cancer s/p ureteral stent who presented with pyelonephritis and bacteremia 2/2 E faecalis on 01/10/22.  Allergy and Immunology is being consulted for evaluation of amoxicillin allergy label given ID recommendations for ampicillin therapy x6 weeks duration for bacteremia/endocarditis.    The patient currently has altered mental status and has been determined unable to provide a reliable history.  Reportedly, he had a mild rash with amoxicillin in the past first noted in the [facility] on 07/01/2019 as "rash" with no additional clinical information.      The patient was given a prescription for amoxicillin-clavulanate on 12/07/2021 at discharge after receiving ceftriaxone while inpatient.  It is unclear if the patient took the medication or developed a rash at this time; however, there is no mention of a severe hypersensitivity reaction (anaphylaxis) anywhere in the chart.          Review of Systems:  Unable to assess due to clinical condition      Past Medical History:  Past Medical History:   Diagnosis Date   . Chronic back pain    . Congenital hydronephrosis    . Gout    . Headache    . Hematuria    . HTN (hypertension) 12/10/2021   . Kidney disease    . Kidney stones    . Major depressive disorder, single episode    . Polyarthropathy or polyarthritis of multiple sites    . Retinal detachment    . Urethral stricture        PSHx  Past Surgical History:   Procedure Laterality Date   . CT INSERTION OF SUPRAPUBIC CATH  09/25/2015   . NEPHRECTOMY Right 1995   . APPENDECTOMY     . COLONOSCOPY     . CYSTOSCOPY     . CYSTOSCOPY W/ LASER LITHOTRIPSY     . OTHER SURGICAL HISTORY      Interstim 01/29/2011   . SPINE SURGERY   09/21    Lumbar-sacral fusion   . TRANSURETHRAL RESECTION OF PROSTATE         Allergies   Allergen Reactions   . Amoxicillin Rash   . Sulfa Drugs Unspecified       Current Facility-Administered Medications   Medication Dose Route Frequency Provider Last Rate Last Admin   . acetaminophen (TYLENOL) tablet 650 mg  650 mg Oral Q6H PRN Ermalene Postin, MD   650 mg at 01/12/22 2041   . allopurinol (ZYLOPRIM) tablet 300 mg  300 mg Oral Daily Dupart, Roma Kayser, MD   300 mg at 01/13/22 0847   . aluminum-magnesium-simethicone (MAG-AL PLUS) 200-200-20 MG/5ML suspension 30 mL  30 mL Oral Q6H PRN Dupart, Roma Kayser, MD       . amLODIPINE (NORVASC) tablet 10 mg  10 mg Oral Daily Dupart, Roma Kayser, MD   10 mg at 01/13/22 0847   . cefTRIAXone (ROCEPHIN) 2,000 mg in sodium chloride 0.9 % 50 mL IVPB  2,000 mg IntraVENOUS Q12H Oneita Kras, MD 100 mL/hr at 01/13/22 1756 2,000 mg at 01/13/22 1756   . DAPTOmycin (CUBICIN) 700 mg in sodium chloride 0.9 % 50 mL IVPB  8 mg/kg IntraVENOUS Q48H NR Oneita Kras, MD  100 mL/hr at 01/12/22 1747 700 mg at 01/12/22 1747   . docusate sodium (COLACE) capsule 250 mg  250 mg Oral BID Lenis Noon, MD   250 mg at 01/13/22 0848   . DULoxetine (CYMBALTA) CR capsule 20 mg  20 mg Oral Daily Dupart, Roma Kayser, MD   20 mg at 01/13/22 0847   . heparin injection 5,000 Units  5,000 Units Subcutaneous Q8H Dupart, Roma Kayser, MD   5,000 Units at 01/13/22 1418   . lidocaine (UROJET) 2 % topical jelly 10 mL  10 mL Urethral Daily PRN Carlota Raspberry, MD   10 mL at 12/16/21 1133   . LORazepam (ATIVAN) tablet 0.5 mg  0.5 mg Oral Q6H PRN Meryl Crutch, MD       . oxyCODONE (ROXICODONE) tablet 5 mg  5 mg Oral Q12H PRN Radene Gunning, PHARMD   5 mg at 01/13/22 0850   . polyethylene glycol (MIRALAX) packet 17 g  17 g Oral Daily PRN Dupart, Roma Kayser, MD   17 g at 01/13/22 1240   . pregabalin (LYRICA) capsule 100 mg  100 mg Oral Nightly Dupart, Roma Kayser, MD   100 mg at 01/12/22 2042   . sodium  chloride 0.9 % flush 3 mL  3 mL IntraVENOUS Q8H Dupart, Roma Kayser, MD   3 mL at 01/12/22 1400   . sodium chloride 0.9 % flush 3 mL  3 mL IntraVENOUS PRN Dupart, Roma Kayser, MD       . sodium chloride 0.9 % TKO infusion   IntraVENOUS Continuous PRN Dupart, Roma Kayser, MD       . tamsulosin (FLOMAX) capsule 0.4 mg  0.4 mg Oral Daily Dupart, Roma Kayser, MD   0.4 mg at 01/13/22 0848   . tiZANidine (ZANAFLEX) tablet 2 mg  2 mg Oral Q8H PRN Dupart, Roma Kayser, MD   2 mg at 01/11/22 2035     Current Outpatient Medications   Medication Sig Dispense Refill   . acetaminophen (TYLENOL) 325 MG tablet Take 2 tablets (650 mg) by mouth every 6 hours as needed for Mild Pain (Pain Score 1-3) (alternate with motrin). 30 tablet 0   . ALAWAY 0.025 % ophthalmic solution INSTILL 1 DROP INTO BOTH EYES TWICE A DAY AS DIRECTED     . albuterol 108 (90 Base) MCG/ACT inhaler Inhale 2 puffs by mouth every 4 hours as needed for Wheezing. 6.7 g 0   . allopurinol (ZYLOPRIM) 300 MG tablet Take 300 mg by mouth daily.     Marland Kitchen amLODIPINE (NORVASC) 5 MG tablet Take 10 mg by mouth daily.     Marland Kitchen ascorbic acid (VITAMIN C) 500 MG/5ML suspension      . benzonatate (TESSALON) 100 MG capsule Take 1 capsule (100 mg) by mouth every 8 hours as needed for Cough. 30 capsule 0   . docusate sodium (COLACE) 100 MG capsule Take 1 capsule (100 mg) by mouth 3 times daily for 5 days. 15 capsule 0   . docusate sodium (COLACE) 250 MG capsule Take 1 capsule (250 mg) by mouth 2 times daily. 60 capsule 0   . DULoxetine (CYMBALTA) 20 MG CR capsule Take 1 capsule (20 mg) by mouth daily. 90 capsule 3   . guaiFENesin-dextromethorphan (ROBITUSSIN DM) 100-10 MG/5ML syrup Take 10 mL by mouth every 4 hours as needed for Cough. 560 mL 0   . lidocaine (ASPERCREME) 4 % patch Apply 1 patch topically every 24 hours. Leave patch on for 12 hours, then  remove for 12 hours. 30 patch 0   . menthol (CEPACOL) 3 MG lozenge Take 1 lozenge by mouth every 2 hours as needed for Pain. 60 lozenge 0   .  naloxone (NARCAN) 4 mg/0.1 mL nasal spray For suspected opioid overdose, call 911! Then spray once in one nostril. Repeat after 3 minutes if no or minimal response using a new spray in other nostril. 2 each 0   . ondansetron (ZOFRAN ODT) 8 MG disintegrating tablet Take 1 tablet (8 mg) on or under the tongue every 8 hours as needed for Nausea/Vomiting. 10 tablet 0   . oxyCODONE (ROXICODONE) 5 MG immediate release tablet Take 1 tablet (5 mg) by mouth 2 times daily as needed for Moderate Pain (Pain Score 4-6) or Severe Pain (Pain Score 7-10). 30 tablet 0   . Phenazopyridine HCl 99.5 MG TABS Take 1 tablet (100 mg) by mouth 3 times daily as needed for burning pain with urination, or urethral pain. 12 tablet 0   . Polyethyl Glycol-Propyl Glycol (SYSTANE ULTRA OP)      . polyethylene glycol (GLYCOLAX) 17 GM/SCOOP powder Take 17 g by mouth daily. Mix with 4 to 8 ounces of fluid (water, juice, soda, coffee, or tea) prior to administration. 255 g 0   . pregabalin (LYRICA) 100 MG capsule TAKE 1 CAPSULE BY MOUTH EVERYDAY AT BEDTIME 30 capsule 0   . senna (SENOKOT) 8.6 MG tablet Take 1 tablet (8.6 mg) by mouth daily. 30 tablet 0   . tamsulosin (FLOMAX) 0.4 MG capsule Take 1 capsule (0.4 mg) by mouth daily. 30 capsule 3   . tizanidine (ZANAFLEX) 2 MG tablet Take 1 tablet (2 mg) by mouth every 8 hours as needed (spasm). 40 tablet 0   . triamcinolone (KENALOG) 0.1 % cream Apply 1 Application topically 2 times daily. Apply a thin layer as directed     . VITAMIN D PO Take 125 mg by mouth daily.         Family History:  No family history of allergies, atopic dermatitis, asthma, autoimmune disease, or immunodeficiency    Vitals:   01/13/22  0400 01/13/22  0844 01/13/22  1214 01/13/22  1646   BP: 94/54 104/60 95/53 109/54   Pulse: 51 61 61 52   Resp:  '17 18 18   ' Temp: 98.7 F (37.1 C) 98.8 F (37.1 C) 98.7 F (37.1 C) 98.4 F (36.9 C)   SpO2: 92% 95% 97% 99%       Laboratory values:  All labs reviewed pertinent labs below:  Lab  Results   Component Value Date    WBC 4.7 01/13/2022    RBC 3.27 (L) 01/13/2022    HGB 9.3 (L) 01/13/2022    HCT 28.9 (L) 01/13/2022    MCV 88.4 01/13/2022    MCHC 32.2 01/13/2022    RDW 14.8 (H) 01/13/2022    PLT 215 01/13/2022    MPV 10.4 01/13/2022       Lab Results   Component Value Date    BUN 28 (H) 01/13/2022    CREAT 1.77 (H) 01/13/2022    CL 100 01/13/2022    NA 135 (L) 01/13/2022    K 3.6 01/13/2022    [place] 8.4 (L) 01/13/2022    TBILI 0.46 01/13/2022    ALB 2.8 (L) 01/13/2022    TP 5.6 (L) 01/13/2022    AST 19 01/13/2022    ALK 73 01/13/2022    BICARB 22 01/13/2022    ALT 11 01/13/2022  GLU 121 (H) 01/13/2022       Assessment/Recommendations:  Cormick Moss is a 71 year old year old male w/ PMHx/osolitary L kidney and L upper tract urothelial cancer s/p ureteral stent who presented with pyelonephritis and bacteremia 2/2 E faecalis on 01/10/22.  Allergy and Immunology is being consulted for evaluation of amoxicillin allergy label given ID recommendations for ampicillin therapy x6 weeks duration for bacteremia/endocarditis..    Given the patient's current mental status, additional history cannot be acquired.  Per the EMR, the patient has a history of a "rash," and per history from other providers noted in the EMR, the rash was "mild."  The patient was prescribed Augmentin (containing amoxicillin) on 12/07/2021 at discharge; while we are unable to ascertain if he took this medication, there is again no mention of a severe hypersensitivity reaction.      Given the patient has never been noted to have a severe hypersensitivity reaction to a beta-lactam antibiotic, it would be reasonable to proceed with a graded challenge under close observation.      We recommend administering a 10% test dose of the desired dose of ampicillin with 30 minutes of close monitoring, particularly of vital signs, including respiratory rate, heart rate, and blood pressure, followed by the remaining 90% of the desired dose 30  minutes after the initial test dose with an addition 60 minutes of monitoring.  Epinephrine should be at bedside and given for any symptoms of anaphylaxis.      If the patient tolerates the dose without any symptoms, the following doses of ampicillin can be given without monitoring.  If the patient has a reaction, we recommend treating and then proceeding with desensitization in the ICU setting.  A desensitization protocol can be found in the IP Gen Med Desensitization order set.     Plan reviewed by Dr. Vida Roller.     Hoy Morn, MD  [facility]  Fellow, PGY-5  [place] Pager: [phone]

## 2022-01-13 NOTE — Interdisciplinary (Signed)
Report given to [place] RN on [place].

## 2022-01-13 NOTE — Progress Notes (Signed)
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    Now s/p L PCN placement by IR on 1/24      Subjective/Events:   Febrile overnight, having chills  Pain well controlled  No n/v  Not ambulating  L flank pain   PCN and foley draining CYU  ID consulted yesterday to get TTE, CT head negative, CT chest with improved multifocal pneumonia  Scheduled Meds  . allopurinol  300 mg Daily   . amLODIPINE  10 mg Daily   . docusate sodium  250 mg BID   . DULoxetine  20 mg Daily   . heparin  5,000 Units Q8H   . pregabalin  100 mg Nightly   . sodium chloride  3 mL Q8H   . tamsulosin  0.4 mg Daily     PRN Meds  . acetaminophen  650 mg Q6H PRN   . aluminum-magnesium-simethicone  30 mL Q6H PRN   . lidocaine  10 mL Daily PRN   . LORazepam  0.5 mg Q6H PRN   . oxyCODONE  5 mg Q12H PRN   . polyethylene glycol  17 g Daily PRN   . sodium chloride  3 mL PRN   . sodium chloride   Continuous PRN   . tizanidine  2 mg Q8H PRN     IV Meds  . cefTRIAXone (ROCEPHIN) IV 2,000 mg (01/13/22 0526)   . DAPTOmycin (CUBICIN) IVPB 700 mg (01/12/22 1747)   . sodium chloride           Objective:  Vital Signs:   Latest Entry  Range (last 24 hours)    Temperature: 98.7 F (37.1 C)  Temp  Avg: 100.2 F (37.9 C)  Min: 98.5 F (36.9 C)  Max: 102.3 F (39.1 C)    Blood pressure (BP): 94/54  BP  Min: 79/63  Max: 138/48    Heart Rate: 51   Pulse  Avg: 70.3  Min: 50  Max: 80    Respirations: 20  Resp  Avg: 18.7  Min: 18  Max: 20    SpO2: 92 %  SpO2  Avg: 92 %  Min: 87 %  Max: 94 %       No data recorded       Intake/Output (Last day):  01/25 0600 - 01/26 0559  In: 5277 [I.V.:1053]  Out: 2885 [Urine:2885]      Physical Exam:  Chaperone: visible from nursing station  GENERAL: cooperative, tired  PULM: non-labored breathing  GI: soft, NT, ND  BACK: L PCN draining CYU  GU: cloudy urine in foley, mild to no suprapubic tenderness  SKIN: no new significant rashes or lesions  EXT: warm and well perfused.   PSYCH: mood appropriate      Laboratory data:   Recent Labs     01/11/22  1610 01/12/22  0542 01/13/22  0425   WBC 18.6* 14.5* 4.7   RBC 3.67* 3.48* 3.27*   HGB 11.1* 10.3* 9.3*   HCT 31.6* 30.8* 28.9*   MCV 86.1 88.5 88.4   MCHC 35.1 33.4 32.2   RDW 14.8* 14.8* 14.8*   PLT 258 239 215   MPV 10.3 10.1 10.4   SEG 83  --  81   LYMPHS 7  --  11   MONOS 9  --  8   EOS 0  --  0   BASOS 0  --  0     Recent Labs     01/10/22  1557 01/11/22  0322 01/12/22  0542 01/13/22  0425   NA 133*   < > 133* 135*   K 5.0   < > 4.1 3.6   CL 95*   < > 96* 100   BICARB 22   < > 22 22   BUN 29*   < > 41* 28*   CREAT 2.62*   < > 3.21* 1.77*   GLU 115*   < > 127* 121*   [place] 9.4   < > 8.9 8.4*   ALB 4.3  --   --  2.8*   MG  --   --  2.1  --    PHOS  --   --  4.3  --     < > = values in this interval not displayed.     Recent Labs     01/10/22  1557 01/13/22  0425   TBILI 1.15 0.46   TP 7.2 5.6*   AST 30 19   ALK 111 73   ALT 11 11     Recent Labs     01/11/22  0322 01/11/22  1407   PT 13.7* 16.6*   PTT 29 31   INR 1.2 1.5       Lab Results   Component Value Date    COLORUA [place] (A) 01/10/2022    APPEARUA Cloudy 01/10/2022    GLUCOSEUA Negative 01/10/2022    BILIUA Negative 01/10/2022    KETONEUA Negative 01/10/2022    SGUA 1.015 01/10/2022    BLOODUA 3+ (A) 01/10/2022    PHUA 6.5 01/10/2022    PROTEINUA 3+ (A) 01/10/2022    UROBILUA Negative 01/10/2022    NITRITEUA Negative  01/10/2022    LEUKESTUA 500 (A) 01/10/2022    WBCUA >50 (A) 01/10/2022    RBCUA >50 (A) 01/10/2022    HYALINEUA >5 (A) 12/01/2021       Microbiology:  Microbiology Results (last 30 days)     Procedure Component Value - Date/Time    Blood Culture Blood Culture Set Blood [[id number]] Collected: 01/11/22 0945    Lab Status: Preliminary result Specimen: Blood Updated: 01/11/22 1315     Blood Culture Result Culture in progress.    Blood Culture Blood Culture Set Blood [[id number]]  (Abnormal) Collected: 01/11/22 0940    Lab Status: Preliminary result Specimen: Blood Updated: 01/12/22 0237     Blood Culture Result --     Anaerobic Bottle Gram stain:  Gram positive cocci in pairs and chains      Blood Culture Blood Culture Set Blood [[id number]]  (Abnormal) Collected: 01/10/22 2116    Lab Status: Preliminary result Specimen: Blood Updated: 01/11/22 1211     Blood Culture Result --     Anaerobic Bottle Gram stain:  Gram positive cocci in chains  .  Aerobic Bottle Gram  stain:  Gram positive cocci in chains      Urine Culture Urine [[id number]] Collected: 01/10/22 2004    Lab Status: Preliminary result Specimen: Urine Updated: 01/11/22 1130     Urine Culture Result Further report to follow    Blood Culture Blood Culture Set Blood [[id number]]  (Abnormal) Collected: 01/10/22 1558    Lab Status: Preliminary result Specimen: Blood Updated: 01/11/22 0904     Blood Culture Result --     Anaerobic Bottle Gram stain:  Gram positive cocci in pairs and chains  Called to and read back IO:[email or web address] Jamito RN on 01/11/2022 @ 06:01   By MWU:[id number]  Aerobic Bottle Gram stain:  Gram positive cocci in pairs and chains       Blood Culture ENTEROCOCCUS FAECALIS DNA DETECTED  vancomycin resistance DNA not detected  .  This blood culture was tested by the Verigene multiplex  assay for DNA from the following bacterial targets:  Staphylococcus aureus, S.epidermidis, S.lugdunensis,  other Staph species, Enterococcus faecalis, E.faecium,  Streptococcus  agalactiae (Group B), S.pneumoniae,   S.pyogenes (Group A), S.anginosus group, other   Streptococcus species, Listeria species, mecA  (methicillin resistance), and vanA/B (vancomycin  resistance). Negative results do not preclude an   infection caused by other bacteria or yeast not  screened for by this test or a mixed infection.   Results of nucleic acid testing are preliminary and  will be verified by culture results, as detection of  antibiotic resistance genes may not always correspond  to antibiotic resistance phenotype.      Influenza A/B & SARS-CoV-2 PCR Combo for Rapid Response Lab Nasal-Pharyngeal [[id number]] Collected: 01/10/22 1558    Lab Status: Final result Specimen: Nasal-Pharyngeal Updated: 01/10/22 1735     SARS-CoV-2 PCR, RRL Not Detected     Comment: SARS-CoV-2 RNA not detected. A negative   test does NOT completely rule out   infection or preclude the possibility of   recent exposure to SARS-CoV-2 (COVID-19).    This result has been reported to the [facility] and   Epidemiology Unit as required by  [employer],   [place].          Influenza A PCR, RRL Not Detected     Comment: Influenza A virus RNA not detected. This   result should not be used as the sole   basis for making clinical treatment   decisions, and should be used in   conjunction with other clinical findings.          Influenza B PCR, RRL Not Detected     Comment: Influenza B virus RNA not detected. This   result should not be used as the sole   basis for making clinical treatment   decisions, and should be used in   conjunction with other clinical findings.  ------------------------------------------  This test uses a nucleic acid   amplification and reverse transcriptase   PCR assay for the qualitative detection of  nucleic acids from Influenza A,   Influenza B, and SARS-CoV-2 (COVID-19)   viruses. This assay has received   Emergency Use Authorization from the [name]    Food and Drug Administration (FDA). This   test was developed and its performance   characteristics determined by [facility]. This  test is for clinical purposes and should   not be regarded as investigational or for  research use. [facility] are certified under  the Clinical Laboratory Improvement   Amendments (CLIA) as qualified to perform  high complexity laboratory testing.         Narrative:      Collect using a flocked swab in the designated M4RT viral  transport medium. Other swab types or VTM tubes will be  rejected.  Is the patient a [facility] employee?->No    Anaerobic Culture Sterile Container Kidney, Left [[id number]] Collected: 12/24/21 1124    Lab Status: Final result Specimen: Calculus from Kidney, Left Updated: 12/31/21 8676     Anaerobic Culture Result No Anaerobes isolated    Fungus Culture Sterile Container Kidney, Left [[id number]] Collected: 12/24/21 1124    Lab Status: Preliminary result Specimen: Calculus from Kidney, Left Updated: 01/12/22 0606     Fungus Culture Result --     Culture in progress.  No fungal growth to date  No fungal growth to date      General Aerobic Culture, OR Only Sterile Container Kidney, Left [[id number]] Collected: 12/24/21 1124    Lab Status: Final result Specimen: Calculus from Kidney, Left Updated: 12/26/21 1946     Gram Stain Result --     Rare white blood cells  No organisms seen       General Aerobic Culture, OR Only No Growth    Urine Culture Urine [[id number]] Collected: 12/16/21 0930    Lab Status: Final result Specimen: Urine Updated: 12/18/21 0946     Urine Culture Result No Growth          Imaging:  CT AP 1/25  IMPRESSION:  CT scan of the abdomen and pelvis without IV contrast.    moderate left hydronephrosis, unchanged compared to 04/22/2021 CT urography.        Assessment and Plan:  Reuven Braver is a 71 year old male w/ UCC and solitary kidney with AKI, urosepsis and hydronephrosis with  stent in place now s/p IR Left PCN placement on 1/24    Febrile overnight again, yellow urine out of PCN, foley catheter in place.  ID consulted, abx broadened and imaging showed possible pneumonia and CT head normal. Getting TTE. Recommend bladder scan to ensure catheter is draining bladder completely    Patient seen with team on rounds and to be discussed w/ attending, Dr. Sallyanne Kuster.      ---  Stanton Kidney, MD  Urology, PGY-2  [facility]    Urology pagers  Please do not hesitate to contact the appropriate service with any questions or concerns.  Prudence Davidson ([facility]): [phone]  [place]: [phone]

## 2022-01-13 NOTE — Plan of Care (Signed)
Problem: Promotion of Health and Safety  Goal: Promotion of Health and Safety  Description: The patient remains safe, receives appropriate treatment and achieves optimal outcomes (physically, psychosocially, and spiritually) within the limitations of the disease process by discharge.    Information below is the current care plan.  Outcome: Progressing  Flowsheets (Taken 01/13/2022 8527)  Individualized Interventions/Recommendations #1: Monitor L nephrostomy, need frequent emptying of bag.  Individualized Interventions/Recommendations #2 (if applicable): Fall Precaution in place, call light within reach, personal belonging within reach, frequent rounding.  Individualized Interventions/Recommendations #3 (if applicable): Monitor s/s infection.  Outcome Evaluation (rationale for progressing/not progressing) every shift: Patient with sepsis d/t pyelonephritis, CAUTI and E faecalis bacteremia. A&O x4, restless beginning of shift, after having a BM, pt calmed down. BP soft side, 1LR bolus given, lactate level is 0.9. Tmax 102, tylenol was given, afebrile through out the shift. Patient requiring 2L oxygen, desats to 85% on RA. continue to monitor.

## 2022-01-13 NOTE — Interdisciplinary (Signed)
Occupational Therapy Evaluation and Discharge    Admitting Physician:  Oneita Kras, MD  Admission Date 01/10/2022    Inpatient Diagnosis:   Problem List       Codes    Sepsis, due to unspecified organism, unspecified whether acute organ dysfunction present (CMS-HCC)     ICD-10-CM: A41.9  ICD-9-CM: 038.9, 995.91    Decreased functional mobility     ICD-10-CM: R26.89  ICD-9-CM: 781.99    Decreased activities of daily living (ADL)     ICD-10-CM: Z78.9  ICD-9-CM: V49.89        IP Start of Service  Start of Care: 01/13/22  Reason for referral: Decline in performance of activities of daily living (ADL)    Preferred [place]    Past Medical History:   Diagnosis Date   . Chronic back pain    . Congenital hydronephrosis    . Gout    . Headache    . Hematuria    . HTN (hypertension) 12/10/2021   . Kidney disease    . Kidney stones    . Major depressive disorder, single episode    . Polyarthropathy or polyarthritis of multiple sites    . Retinal detachment    . Urethral stricture       Past Surgical History:   Procedure Laterality Date   . CT INSERTION OF SUPRAPUBIC CATH  09/25/2015   . NEPHRECTOMY Right 1995   . APPENDECTOMY     . COLONOSCOPY     . CYSTOSCOPY     . CYSTOSCOPY W/ LASER LITHOTRIPSY     . OTHER SURGICAL HISTORY      Interstim 01/29/2011   . SPINE SURGERY  09/21    Lumbar-sacral fusion   . 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 (12/24/21) who presented on 1/23 with sepsis 2/2 pyelonephritis w/ E faecalis bacteremia s/p PCN     Fall history No falls reported in the last 6 months     [place] Name 01/13/22 1120          Functional History    Prior Level of Function No deficits     General ADL/Self-Care Assistance Needs Independent with ADLs and self care using adaptive device/equipment     Equipment required for mobility in the home [place] Name 01/13/22 1120          Social History    Living Situation Lives with family     [place] accessibility Performs activities of daily living (ADL's) on one level     Other  Social History Information Pt lives with his sister, brother in law and niece in a 1 level home. Pt reports indep with ADL and uses SPC for functional mobility. Pt has a walker.     [place] Name 01/13/22 1120          Subjective    Patient status Patient agreeable to treatment;Nursing in agreement for treatment;Patient pain control adequate to participate in therapy     [place] Name 01/13/22 1120          Pain Assessment    Pain Asssessment Tool Numeric Pain Rating Scale     Other Pain Assessment Information unrated back pain     Row Name 01/13/22 1120          Activities of Daily Living (ADLs)    Self Grooming Supervised     Other Self Grooming Information Simulated at counter top with SUP     Toilet Transfers Supervised     Other Toilet Transfers Information Simulated with SUP     Row Name 01/13/22 1120          [place] AM-PAC: Daily Activity    Assistance Needed to Put on and Take off Regular Lower Body Clothing 3     Assistance Needed to Bathe, Including Washing, Rinsing, and Drying 3     Assistance Needed to Toilet Environmental manager, Bedpan, or Urinal) 3     Assistance Needed to Put on and Take off Regular Upper Body Clothing 4     Assistance Needed to Take Care of Personal Grooming Such as Brushing Teeth 3     Assistance Needed to Eat Meals 4     AM-PAC Daily Activity Total Score 20     AMP-PAC Daily Activity  Impairment rating Score 20-22 - 20-39% impaired     Row Name 01/13/22 1120          Objective    Overall Cognitive Status At baseline level     Other  Cognitive Status Information impaired attention, very distractible, perseverative, tangential in conversation     Communication No communication limitations or impairments noted. Current status of hearing, speech and vision allow functional communication.     Coordination/Motor control No limitations or impairments noted. Movement patterns are fluid and coordinated throughout     Balance Balance limitations present     Static Sitting Balance Good - able to maintain balance without handhold support, limited postural sway     Dynamic Sitting Balance Good - accepts moderate challenge, able to maintain balance while picking object off floor     Static Standing Balance Good - able to maintain balance without handhold support, limited postural sway     Dynamic Standing Balance Fair - accepts minimal challenge, able to maintain balance while turning head/trunk     Extremity Assessment Flexibility, strength, muscle tone and sensation grossly within functional limits throughout     Functional Mobility Functional mobility deficits present     Bed Mobility Supervised     Bed Mobility Comments Sup <> sit with SUP     Transfers to/from Stand Supervised     Transfer Comments Sit <> stand from EOB with SUP and use of FWW     Ambulation during functional tasks Supervised     Device used for ambulation/mobility Front wheeled walker     Ambulation Distance Pt ambulated household distances with SUP, cued for safety given distractibility     Other Objective Findings Pt received supine in bed amenable to therapy. NC on the floor,  pt satting at >90% on RA. Sup > sit with SUP. Good static sitting balance. BP stable. Functional transfers/mobility on unit with SUP and FWW. Minor LOB with turns, cued for safety as pt impulsive/distractible. Pt hyperverbal/perseverative, frequent redirection  required. Pt SUP to return to supine. All needs met, call bell in reach, RN notified.                OT Acute Tool Box     Row Name 01/13/22 1120          Cognition Assessment    Overall Cognitive Status At baseline level                Eval cont.     [place] Name 01/13/22 1120          Patient/Family Education    Learner(s) Patient     Learner response to rehab patient education interventions Verbalizes understanding;Needs reinforcement     Row Name 01/13/22 1120          Assessment    Assessment Pt presents to OT primarily limited by pain, deconditioning, impaired balance, impulsivity and impaired attention. Pt is currently participating in ADL/functional mobility at a SUP level with use of FWW and has support at home. Discussed home safety and explored potential DME needs. At this time, pt with no further acute OT needs. Anticipate safe d/c home once medically stable with SUP for ADL/functional mobility and increased assistance with IADL/community level tasks. Also recommend FWW and use of shower chair given fall risk. OT will sign off.     [place] Name 01/13/22 1120          Treatment Plan Disussion    Treatment Plan Discussion and Agreement Patient support system determined and all questions were asked and answered;Patient/family/caregiver stated understanding and agreement with the therapy plan     [place] Name 01/13/22 1120          Treatment Plan    Frequency of treatment Patient appropriate for discharge from therapy     Duration of treatment (number of visits) One time only, further treatment not indicated     Status of treatment One time only treatment, further skilled therapy not indicated     [place] Name 01/13/22 1120          Patient Safety Considerations    Patient safety considerations Patient returned to bed at end of treatment;Call light left in reach and fall precautions in place;Patient may be at risk for falls;Nursing notified of safety considerations at end of treatment     Patient assistive device  requirements for safe ambulation [place] Name 01/13/22 1120          Post Acute Discharge Recommendations    Discharge Rehabilitation Recommendations If available, recommend discharge to supervised living situation     Level of Care  Home with assitance     Current Level of Assistance  Supervised     Equipment recommendations [place] Name 01/13/22 1120          Patient Mobilization Recommendations (as tolerated)     Toileting Patient able to use bathroom     Ambulation Patient to ambulate with nursing two times per day shift, once per night shift     Out of bed Patient out of bed to chair for all meals     Device  Walker     Safe patient handling equipment  None needed  [place] Name 01/13/22 1120          Therapy Plan Communication    Therapy Plan Communication Discussed therapy plan with Nursing and/or Physician     [place] Name 01/13/22 1120          Occupational Therapy Patient Discharge Instructions    Your Occupational Therapist suggests the following Continue to follow your prescribed mobility precautions when transferring to the chair and toilet as instructed;Continue to complete your self care Activities of Daily Living as frequently as possible;Continue to use energy conservation, pursed lip breathing and self-pacing when completing your self care Activities of Daily Living;Supervision is suggested when you     Supervision is suggested when you bath;toilet     [place] Name 01/13/22 1120          Type of Eval    Low Complexity [phone]) Completed     [place] Name 01/13/22 1120          Therapeutic Procedures    Therapeutic Activities (26415) Assistance/facilitation of bed mobility;Dynamic activities to improve performance of functional tasks/activities;Facilitation of safety awareness/responses during functional tasks;Graded physical assist for functional activities;Patient education;Progressive mobilization to improve functional independence;Transfer training with weight shift and direction  change         Total TIMED Treatment (min) 30               The occupational therapist of record is endorsed by evaluating occupational therapist.

## 2022-01-13 NOTE — Plan of Care (Signed)
Problem: Promotion of Health and Safety  Goal: Promotion of Health and Safety  Description: The patient remains safe, receives appropriate treatment and achieves optimal outcomes (physically, psychosocially, and spiritually) within the limitations of the disease process by discharge.    Information below is the current care plan.  Flowsheets  Taken 01/13/2022 1847 by Andrena Mews, RN  Outcome Evaluation (rationale for progressing/not progressing) every shift: Resumed care at 1400. Vitals stable, BP improving to 282S systolic. BM x2 on shift, pt anxious surround BMs. Denied pain. Satting well on RmO2. No UOP via urethra, only through PCN.  Taken 01/13/2022 0800 by Royston Cowper, RN  Patient /Family stated Goal: pain control  Note:

## 2022-01-13 NOTE — Progress Notes (Signed)
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     Interval Events:   - Aspiration event while headed to CT scanner yesterday afternoon; now requiring 2L NC  - Febrile last night to 102.3  - Leukocytosis and Cr improved; WBC down to 4.7 today from 14.5, and Cr 1.77 from 3.21  - Repeat blood cultures from yesterday now growing GPCs in pairs and chains     Mr. Redner reports feeling better today. He is much more alert than yesterday though still expresses some confusion. He notes that prior to admission, he was not having any output from his foley despite drinking a lot of water.     Today, he reports having some rigors with fevers and ongoing L flank/abdominal pain. He denies any N/V.      He reports that he developed a mild rash when given amoxicillin in the past. He notes that this was a few months ago; however, per chart review, he has had the amoxicillin allergy listed for many years.     Antibiotics:   Ceftriaxone 2g q12 1/25-  Daptomycin 1/25-    Prior  Vancomycin 1/24-  Ceftriaxone 1g q24 1/23-1/24    Allergies   Allergen Reactions   . Amoxicillin Rash   . Sulfa Drugs Unspecified       Objective:  Vitals:    01/12/22  2238 01/12/22  2323 01/13/22  0143 01/13/22  0400   BP:  (!) '96/45 97/53 94/54 '   Pulse:  64 50 51   Resp:       Temp: 98.5 F (36.9 C) 98.8 F (37.1 C)  98.7 F (37.1 C)   SpO2:  92% 93% 92%       GEN: Pt laying in bed, awake and alert, answering most questions appropriately b  HEENT: EOMI. No scleral icterus. MMM.  CV: RRR. Normal S1 and S2. No murmurs, rubs, or gallops.   PULM: Normal WOB on room air. CTA bilaterally anteriorly. No rales, rhonchi, or wheezes.   ABD: Normoactive bowel sounds. PCN arising from L flank  without surrounding erythema. Draining clear urine. R flank tender to palpation.  MSK: Warm, well perfused. No joint erythema or tenderness. No splinter hemorrhages.  SKIN: Warm, dry. No obvious rashes, lesions, or jaundice  NEURO: Face symmetric. Moving all 4 extremities.    Patient Lines/Drains/Airways Status     Active PICC Line / CVC Line / PIV Line / Drain / Airway / Intraosseous Line / Epidural Line / ART Line / Line Type / Wound / Pressure Ulcer Injury     Name Placement date Placement time Site Days    Peripheral IV - 22 G Left Hand 01/12/22  2130  Hand  less than 1    Nephrostomy -  Left #1 01/11/22  1648  Left  1    Indwelling Urinary Catheter -  01/10/22 RN Latex free 16 fr  Yes 01/10/22  1836  Latex free  2    Stent -  Ureteral (left) 12/24/21  1102  Ureteral (left)  19                  Labs:   Recent  Labs     01/10/22  1557 01/11/22  0322 01/12/22  0542 01/13/22  0425   NA 133* 130* 133* 135*   K 5.0 4.9 4.1 3.6   CL 95* 95* 96* 100   BICARB 22 21* 22 22   BUN 29* 33* 41* 28*   CREAT 2.62* 2.89* 3.21* 1.77*   [place] 9.4 8.2* 8.9 8.4*   MG  --   --  2.1  --    PHOS  --   --  4.3  --    TP 7.2  --   --  5.6*   ALB 4.3  --   --  2.8*   TBILI 1.15  --   --  0.46   AST 30  --   --  19   ALT 11  --   --  11   ALK 111  --   --  73       Recent Labs     01/10/22  1557 01/11/22  0322 01/12/22  0542 01/13/22  0425   WBC 15.4* 18.6* 14.5* 4.7   HGB 12.4* 11.1* 10.3* 9.3*   HCT 36.3* 31.6* 30.8* 28.9*   MCV 86.6 86.1 88.5 88.4   PLT 297 258 239 215   SEG 83 83  --  81   LYMPHS 9 7  --  11   MONOS 8 9  --  8   EOS 0 0  --  0       Recent Labs     01/11/22  0322 01/11/22  1407   PTT 29 31   INR 1.2 1.5     CPK   Date Value Ref Range Status   01/13/2022 287 0 - 175 U/L Final       Micro data:  Blood  1/23 - 4/4 E faecalis  1/24 - GPCs in pairs and chains in 2/4 bottles  1/25 - GPCs in pairs and chains in 1/4 bottles    Respiratory  1/24 MRSA nares - negative    Urine  1/23 - >100k E faecalis (R gent, S amp, vanc MIC  1)    1/24 L nephrostomy - >100k E faecalis     Imaging:  CT Chest 1/25  Marked improvement of multifocal pneumonia with some residual clustered nodular opacities which likely reflect airways centered infection rather than hematogenous spread. No new or worsened areas. No specific imaging features to suggest septic emboli.    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    CT Head 1/25 - no acute abnormalities    CT Abd/Pelvis 1/23  moderate left hydronephrosis, unchanged compared to 04/22/2021 CT urography.    Renal US 1/23  Moderate left hydronephrosis, mildly increased compared to prior. Partially visualized left nephroureteral stent in the left renal pelvis.    Status post right nephrectomy.    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     #High grade E faecalis bacteremia  #Pyelonephritis  #Solitary L kidney, upper tract urothelial cancer  #AKI  Septic on presentation with E faecalis bacteremia. S/p PCN on 1/24. Persistently febrile, but mental status, leukocytosis, and AKI improving. Blood cultures persistently positive. Recommend continuing double coverage of Enterococcus for empiric endocarditis treatment while awaiting additional work-up for endocarditis.   -  Cont daptomycin and ceftriaxone dose to 2g q12 hours              - Appreciate pharmacy's a/w dapto dosing in setting of AKI              - Last CPK 287 (1/26) - recommend monitoring daily while on daptomycin  - TTE  - Cont daily blood cultures until 1 set is negative for 48 hours  - Allergy consult as below    #Amoxicillin allergy  Pt reports mild rash with amox in the past though patient remains somewhat altered: he reports developing the rash a few months ago but the allergy has been on record for many years.   - Ampicillin is  preferred agent for Enterococcus faecalis bacteremia/endocarditis, so we recommend allergy consult to trial abx.     Recommendations discussed with primary team. We will cont to follow.     Patient seen and examined with ID attending, Dr. Gabriel Carina, who agrees with the above assessment and plan.     Louis Matte, MD  Fellow, Division of Infectious Diseases   [facility]

## 2022-01-14 LAB — COMPREHENSIVE METABOLIC PANEL, BLOOD
ALT (SGPT): 24 U/L (ref 0–41)
AST (SGOT): 34 U/L (ref 0–40)
Albumin: 2.7 g/dL — ABNORMAL LOW (ref 3.5–5.2)
Alkaline Phos: 95 U/L (ref 40–129)
Anion Gap: 11 mmol/L (ref 7–15)
BUN: 18 mg/dL (ref 8–23)
Bicarbonate: 23 mmol/L (ref 22–29)
Bilirubin, Tot: 0.26 mg/dL (ref <1.2)
Calcium: 8.4 mg/dL — ABNORMAL LOW (ref 8.5–10.6)
Chloride: 103 mmol/L (ref 98–107)
Creatinine: 1.33 mg/dL — ABNORMAL HIGH (ref 0.67–1.17)
Glucose: 111 mg/dL — ABNORMAL HIGH (ref 70–99)
Potassium: 3.5 mmol/L (ref 3.5–5.1)
Sodium: 137 mmol/L (ref 136–145)
Total Protein: 5.6 g/dL — ABNORMAL LOW (ref 6.0–8.0)
eGFR Based on CKD-EPI 2021 Equation: 58 mL/min

## 2022-01-14 LAB — BLOOD CULTURE

## 2022-01-14 LAB — CBC WITH DIFF, BLOOD
ANC-Automated: 2.3 10*3/uL (ref 1.6–7.0)
Abs Basophils: 0 10*3/uL (ref <0.2)
Abs Eosinophils: 0.1 10*3/uL (ref 0.0–0.5)
Abs Lymphs: 1.3 10*3/uL (ref 0.8–3.1)
Abs Monos: 0.7 10*3/uL (ref 0.2–0.8)
Basophils: 1 %
Eosinophils: 3 %
Hct: 27.1 % — ABNORMAL LOW (ref 40.0–50.0)
Hgb: 9.2 gm/dL — ABNORMAL LOW (ref 13.7–17.5)
Lymphocytes: 30 %
MCH: 29.5 pg (ref 26.0–32.0)
MCHC: 33.9 g/dL (ref 32.0–36.0)
MCV: 86.9 um3 (ref 79.0–95.0)
MPV: 10.4 fL (ref 9.4–12.4)
Monocytes: 15 %
Plt Count: 211 10*3/uL (ref 140–370)
RBC: 3.12 10*6/uL — ABNORMAL LOW (ref 4.60–6.10)
RDW: 14.7 % — ABNORMAL HIGH (ref 12.0–14.0)
Segs: 52 %
WBC: 4.5 10*3/uL (ref 4.0–10.0)

## 2022-01-14 LAB — URINE CULTURE

## 2022-01-14 LAB — 2D ECHO WITH IMAGE ENHANCEMENT AGENT IF NECESSARY
IVC Diameter: 1.34 cm
LA Volume Index: 40.7 ml/m²
LV Ejection Fraction: 75 %

## 2022-01-14 LAB — CPK-CREATINE PHOSPHOKINASE, BLOOD: CPK: 190 U/L — ABNORMAL HIGH (ref 0–175)

## 2022-01-14 MED ORDER — DIPHENHYDRAMINE HCL 50 MG/ML IJ SOLN
25.0000 mg | Freq: Once | INTRAMUSCULAR | Status: AC | PRN
Start: 2022-01-14 — End: 2022-01-15

## 2022-01-14 MED ORDER — SODIUM CHLORIDE 0.9 % IV SOLN
10.0000 mL/h | INTRAVENOUS | Status: AC
Start: 2022-01-14 — End: 2022-01-15

## 2022-01-14 MED ORDER — SODIUM CHLORIDE 0.9 % IV SOLN
200.0000 mg | Freq: Once | INTRAVENOUS | Status: AC
Start: 2022-01-14 — End: 2022-01-14
  Administered 2022-01-14 (×2): 200 mg via INTRAVENOUS
  Filled 2022-01-14: qty 200

## 2022-01-14 MED ORDER — SODIUM CHLORIDE 0.9 % IV SOLN
2000.0000 mg | INTRAVENOUS | Status: DC
Start: 2022-01-14 — End: 2022-01-19
  Administered 2022-01-14 – 2022-01-19 (×28): 2000 mg via INTRAVENOUS
  Filled 2022-01-14 (×27): qty 2000

## 2022-01-14 MED ORDER — SODIUM CHLORIDE 0.9 % IV SOLN
8.0000 mg/kg | INTRAVENOUS | Status: DC
Start: 2022-01-14 — End: 2022-01-15
  Administered 2022-01-14 (×2): 700 mg via INTRAVENOUS
  Filled 2022-01-14: qty 700

## 2022-01-14 MED ORDER — HYDROCORTISONE SOD SUCCINATE 100 MG IJ SOLR (CUSTOM)
100.0000 mg | Freq: Once | INTRAMUSCULAR | Status: AC | PRN
Start: 2022-01-14 — End: 2022-01-15

## 2022-01-14 MED ORDER — ALBUTEROL SULFATE (5 MG/ML) 0.5% IN NEBU
2.5000 mg | INHALATION_SOLUTION | RESPIRATORY_TRACT | Status: DC | PRN
Start: 2022-01-14 — End: 2022-01-19

## 2022-01-14 MED ORDER — EPINEPHRINE 0.3 MG/0.3ML IJ SOAJ
0.3000 mg | INTRAMUSCULAR | Status: AC | PRN
Start: 2022-01-14 — End: 2022-01-15

## 2022-01-14 MED ORDER — SODIUM CHLORIDE 0.9 % IV SOLN
1800.0000 mg | Freq: Once | INTRAVENOUS | Status: AC
Start: 2022-01-14 — End: 2022-01-14
  Administered 2022-01-14 (×2): 1800 mg via INTRAVENOUS
  Filled 2022-01-14: qty 1800

## 2022-01-14 NOTE — Progress Notes (Signed)
DAILY PROGRESS NOTE      Current Hospital  4 Days 2 Hours    Subjective/Interval History:  Afebrile for 48 hours.  Patient underwent echocardiogram.  Overall feeling lot better.  He was still concerned about infection and long-term antibiotic use.  I discussed infectious disease recommendations of obtaining the TEE if TTE was negative.  Ampicillin antibiotic challenge ordered on the floor.  Patient reported chunks of mucus during urination.  Discussed discharge planning and difficult family dynamics.  Patient denied any concerns for mood disorder and refused psych consult.    Objective:    Vital Signs:    01/14/22  0400 01/14/22  0753 01/14/22  1451 01/14/22  1601   BP: (!) 102/56 (!) 92/49 (!) 98/50    Pulse: 54  56    Resp: '18 18 17 17   ' Temp: 97.8 F (36.6 C)  97.5 F (36.4 C)    SpO2: 95% 97% 97%        Physical Exam:  Vitals reviewed  GEN:  AAO x3, NAD  Resp - No inc WOB  CVS - RRR, S1, S2 +  Abd - soft, NT/ND, +BS  Neuro - no acute focal neuro deficits  Psych - Oriented x3    Laboratory data:   Lab Results   Component Value Date    NA 137 01/14/2022    K 3.5 01/14/2022    CL 103 01/14/2022    BICARB 23 01/14/2022    BUN 18 01/14/2022    CREAT 1.33 (H) 01/14/2022    GLU 111 (H) 01/14/2022    [place] 8.4 (L) 01/14/2022     Lab Results   Component Value Date    WBC 4.5 01/14/2022    HGB 9.2 (L) 01/14/2022    HCT 27.1 (L) 01/14/2022    PLT 211 01/14/2022    SEG 52 01/14/2022    LYMPHS 30 01/14/2022    MONOS 15 01/14/2022    EOS 3 01/14/2022     Lab Results   Component Value Date    AST 34 01/14/2022    ALT 24 01/14/2022    ALK 95 01/14/2022    TBILI 0.26 01/14/2022    TP 5.6 (L) 01/14/2022    ALB 2.7 (L) 01/14/2022     No results found for: INR, PTT  Lab Results   Component Value Date    CPK 190 (H) 01/14/2022    CPK 287 (H) 01/13/2022     No results found for: PHUA, [place], [place], KETONEUA, [place], [place], [place], [place], [place], RBCUA, EPITHCELLSUA, CRYSTALSUA, COMMENTSUA    Assessment and  Plan:    #Sepsis d/t  pyelonephritis and CAUTI and E faecalis bacteremia  # acute encephalopathy, metabolic - resolving  - WBC improved now WNL  - d/ced vanco ( 1/25)   - repeat blood cultures daily until clear  - ID consulted  - IV daptomycin and ceftriaxone (1/25-)  can switch to intravenous ampicillin if passes the challenge  - Allergy consulted: rec challenge with 10% dose of ampicillin --> 90% (1/27)  -CT scan of head and chest without contrast to evaluate for septic emboli> multifocal PNA but no septic emboli   - TTE if neg may need TEE    # AKI hydronephrosis d/t malignant ureteral obstruction and sepsis. CR worse today 1-->2.62--> 2.89--> 3.21--> 1.77--> 1.33  - urology following  - IR placed L PCN 1/24  - D/ced foley 1/26    # Urothelial cancer -  - f/u with urology  on 2/7 for treatment planning    #HTN - cont amlodipine 71m daily    # Gout - cont allopurinol daily, may need to dose adjust if Cr does not quickly improve    # Chronic back pain - cont duloxetine and lyrica    # Concern for Schizophrenia:   - refused psych consultation did not show overt signs of schizophrenia    Disposition Planning  Remaining discharge needs: clinical improvement and consultant input (ID)   Discharge location: To be determined  Outpatient Follow-up Needed: Urology, oncology  Estimated Discharge: > 72 hrs    Foley: L PCN  VTE prophylaxis: SQ heparin  Diet: Diet Regular  Code status: Full Code  Guarded prognosis     DOS: 01/14/22    I independently examined the patient, reviewed radiology, EKG (as available) and previous records.     VOneita Kras MD, FVibra Hospital Of BoiseMedicine  PID # [id number]

## 2022-01-14 NOTE — Plan of Care (Signed)
Problem: Promotion of Health and Safety  Goal: Promotion of Health and Safety  Description: The patient remains safe, receives appropriate treatment and achieves optimal outcomes (physically, psychosocially, and spiritually) within the limitations of the disease process by discharge.    Information below is the current care plan.  Outcome: Progressing  Flowsheets  Taken 01/14/2022 0012  Guidelines: Inpatient Nursing Guidelines  Individualized Interventions/Recommendations #1: Encouraged use of call light for assistance. Hourly rounds ongoing. High fall risk precautions maintained. Patient safety maintained.  Individualized Interventions/Recommendations #2 (if applicable): Left nephrostomy draining good volume of cloudy yellow urine. Will continue to monitor. Vital signs stable and afebrile. Pain and nausea controlled. Medications administered as ordered. Patient rested well without any signs of distress.  Individualized Interventions/Recommendations #3 (if applicable): Patient care clustered to encourage rest and healing.  Taken 01/13/2022 2000  Patient /Family stated Goal: to get better

## 2022-01-14 NOTE — Interdisciplinary (Signed)
01/14/22 1414   Follow Up/Progress   Is the Patient Medically Stable for Discharge * No   Where was the patient admitted from? * Home   Barriers to Discharge * None   Anticipated Discharge Disposition/Needs Home with Family   Post Acute Services Referred To TBD   Family/Caregiver's Assessed for * Not Applicable   Respite Care * Not Applicable   Patient/Family/Other Are In Agreement With Discharge Plan * To be determined   Public Health Clearance Needed * Not Applicable   Anticipated DischargeTransportation *  Family/Friend   Anticipated Discharge Transportation Details [name])   [phone] will pick up   01/14/22  2:15 PM    Medical Intervention(s) requiring continued Hospital Stay:  Sepsis 2/2 pyelonephritis w/ E faecalis bacteremia   s/p L PCN placement by IR on 1/24  IV abx  daily blood cultures until 1 set is negative for 48 hours    Anticipated discharge plan/needs:  Home, pending recs for IV ABX  CM to follow for needs    Barriers to Discharge:  none    Alberteen Sam, RN  Care Manager

## 2022-01-14 NOTE — Progress Notes (Signed)
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     Interval Events:   - NAEO, afebrile since 1/25  - Allergy consulted to assist with ampicillin challenge  - Cr continues to improve, 1.33 today from 1.77 yesterday  - TTE without evidence of vegetations    Mr. Willden reports some L flank discomfort around his PCN but otherwise feeling ok. Denies any new symptoms.     Antibiotics:   Ceftriaxone 2g q12 1/25-  Daptomycin 1/25-    Prior  Vancomycin 1/24-  Ceftriaxone 1g q24 1/23-1/24    Allergies   Allergen Reactions   . Amoxicillin Rash   . Sulfa Drugs Unspecified       Objective:  Vitals:    01/14/22  0000 01/14/22  0200 01/14/22  0400 01/14/22  0753   BP:   (!) 102/56 (!) 92/49   Pulse:   54    Resp: '16 16 18    ' Temp:   97.8 F (36.6 C)    SpO2:   95% 97%       GEN: Pt laying in bed, awake and alert, pleasantly conversant  HEENT: EOMI. No scleral icterus. MMM.  CV: Well perfused  PULM: Normal WOB on room air.   ABD:. PCN arising from L flank without surrounding erythema. Draining clear urine. R flank tender to palpation.  MSK: Warm, well perfused. No joint erythema or tenderness. No splinter hemorrhages.  SKIN: Warm, dry. No obvious rashes, lesions, or jaundice  NEURO: Face symmetric. Moving all 4 extremities.    Patient Lines/Drains/Airways Status     Active PICC Line / CVC Line / PIV Line / Drain / Airway / Intraosseous Line / Epidural Line / ART Line / Line Type / Wound / Pressure Ulcer Injury     Name Placement date Placement time Site Days    Peripheral IV - 22 G Left Hand 01/12/22  2130  Hand  1    Nephrostomy -  Left #1 01/11/22  1648  Left  2    Stent -  Ureteral (left) 12/24/21  1102  Ureteral (left)  20                  Labs:   Recent  Labs     01/12/22  0542 01/13/22  0425 01/14/22  0610   NA 133* 135* 137   K 4.1 3.6 3.5   CL 96* 100 103   BICARB '22 22 23   ' BUN 41* 28* 18   CREAT 3.21* 1.77* 1.33*   [place] 8.9 8.4* 8.4*   MG 2.1  --   --    PHOS 4.3  --   --    TP  --  5.6* 5.6*   ALB  --  2.8* 2.7*   TBILI  --  0.46 0.26   AST  --  19 34   ALT  --  11 24   ALK  --  73 95       Recent Labs     01/12/22  0542 01/13/22  0425 01/14/22  0610   WBC 14.5* 4.7 4.5   HGB 10.3* 9.3* 9.2*   HCT  30.8* 28.9* 27.1*   MCV 88.5 88.4 86.9   PLT 239 215 211   SEG  --  81 52   LYMPHS  --  11 30   MONOS  --  8 15   EOS  --  0 3       Recent Labs     01/11/22  1407   PTT 31   INR 1.5     CPK   Date Value Ref Range Status   01/14/2022 190 0 - 175 U/L Final   01/13/2022 287 0 - 175 U/L Final       Micro data:  Blood  1/23 - 4/4 E faecalis  1/24 - 2/4 E faecalis  1/25 - GPCs in pairs and chains in 1/4 bottles  1/26 - CIP    Respiratory  1/24 MRSA nares - negative    Urine  1/23 - >100k E faecalis (R gent, S amp, vanc MIC 1)    1/24 L nephrostomy - >100k E faecalis     Imaging:  CT Chest 1/25  Marked improvement of multifocal pneumonia with some residual clustered nodular opacities which likely reflect airways centered infection rather than hematogenous spread. No new or worsened areas. No specific imaging features to suggest septic emboli.    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    CT Head 1/25 - no acute abnormalities    CT Abd/Pelvis 1/23  moderate left hydronephrosis, unchanged compared to 04/22/2021 CT urography.    Renal US 1/23  Moderate left hydronephrosis, mildly increased compared to prior. Partially visualized left nephroureteral stent in the left renal pelvis.    Status post right nephrectomy.    TTE 1/27  Summary:   1. The left ventricular size is normal. The left ventricular systolic function is normal.   2. Indeterminate left ventricular diastolic function.   3. The right ventricular size is normal and systolic function is  normal.   4. Aortic valve sclerosis is present.   5. Mild aortic valve regurgitation observed with color flow Doppler.   6. Dilated aortic root.   7. Pulmonary artery pressure could not be measured because there was insufficient tricuspid regurgitation.   8. No evidence of valvular vegetation noted. Would consider TEE if clinically indicated.   9. No previous study for comparison.    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     #High grade E faecalis bacteremia  #Pyelonephritis  #Solitary L kidney, upper tract urothelial cancer  #AKI  #Amoxicillin allergy  Septic on presentation with E faecalis bacteremia. S/p PCN on 1/24. Persistently febrile, but mental status, leukocytosis, and AKI improving. Blood cultures persistently positive. Recommend continuing double coverage of Enterococcus for empiric endocarditis treatment while awaiting additional work-up for endocarditis. Preferred regimen since the E faecalis is penicillin sensitive is ampicillin 2g q4 + ceftriaxone 2g q12. TTE negative, but given high grade bacteremia, recommend TEE.   - Graded challenge of ampicillin per Allergy recommendations with a goal dose of ampicillin 2g q4  - If tolerates, d/c daptomycin and continue double coverage with Ceftriaxone 2g q12 and Ampcillin 2g q4  - Cardiology consult for TEE  - Cont daily blood cultures until 1 set is negative for 48 hours    Recommendations discussed with primary team. We  will cont to follow.     Patient seen and examined with ID attending, Dr. Gabriel Carina, who agrees with the above assessment and plan.     Louis Matte, MD  Fellow, Division of Infectious Diseases  [facility]

## 2022-01-15 DIAGNOSIS — C662 Malignant neoplasm of left ureter: Secondary | ICD-10-CM

## 2022-01-15 DIAGNOSIS — R2689 Other abnormalities of gait and mobility: Secondary | ICD-10-CM

## 2022-01-15 DIAGNOSIS — R7989 Other specified abnormal findings of blood chemistry: Secondary | ICD-10-CM

## 2022-01-15 DIAGNOSIS — R7881 Bacteremia: Secondary | ICD-10-CM

## 2022-01-15 DIAGNOSIS — Z789 Other specified health status: Secondary | ICD-10-CM

## 2022-01-15 LAB — COMPREHENSIVE METABOLIC PANEL, BLOOD
ALT (SGPT): 53 U/L — ABNORMAL HIGH (ref 0–41)
AST (SGOT): 78 U/L — ABNORMAL HIGH (ref 0–40)
Albumin: 2.7 g/dL — ABNORMAL LOW (ref 3.5–5.2)
Alkaline Phos: 170 U/L — ABNORMAL HIGH (ref 40–129)
Anion Gap: 12 mmol/L (ref 7–15)
BUN: 15 mg/dL (ref 8–23)
Bicarbonate: 25 mmol/L (ref 22–29)
Bilirubin, Tot: 0.17 mg/dL (ref <1.2)
Calcium: 8 mg/dL — ABNORMAL LOW (ref 8.5–10.6)
Chloride: 106 mmol/L (ref 98–107)
Creatinine: 1.19 mg/dL — ABNORMAL HIGH (ref 0.67–1.17)
Glucose: 106 mg/dL — ABNORMAL HIGH (ref 70–99)
Potassium: 3.9 mmol/L (ref 3.5–5.1)
Sodium: 143 mmol/L (ref 136–145)
Total Protein: 5.5 g/dL — ABNORMAL LOW (ref 6.0–8.0)
eGFR Based on CKD-EPI 2021 Equation: >60 mL/min

## 2022-01-15 LAB — CBC WITH DIFF, BLOOD
ANC-Automated: 2.3 10*3/uL (ref 1.6–7.0)
Abs Basophils: 0 10*3/uL (ref <0.2)
Abs Eosinophils: 0.1 10*3/uL (ref 0.0–0.5)
Abs Lymphs: 1.4 10*3/uL (ref 0.8–3.1)
Abs Monos: 0.5 10*3/uL (ref 0.2–0.8)
Basophils: 1 %
Eosinophils: 3 %
Hct: 30.9 % — ABNORMAL LOW (ref 40.0–50.0)
Hgb: 9.8 gm/dL — ABNORMAL LOW (ref 13.7–17.5)
Lymphocytes: 32 %
MCH: 28.3 pg (ref 26.0–32.0)
MCHC: 31.7 g/dL — ABNORMAL LOW (ref 32.0–36.0)
MCV: 89.3 um3 (ref 79.0–95.0)
MPV: 10.3 fL (ref 9.4–12.4)
Monocytes: 11 %
Plt Count: 244 10*3/uL (ref 140–370)
RBC: 3.46 10*6/uL — ABNORMAL LOW (ref 4.60–6.10)
RDW: 14.8 % — ABNORMAL HIGH (ref 12.0–14.0)
Segs: 53 %
WBC: 4.3 10*3/uL (ref 4.0–10.0)

## 2022-01-15 LAB — CPK-CREATINE PHOSPHOKINASE, BLOOD: CPK: 93 U/L (ref 0–175)

## 2022-01-15 MED ORDER — LACTATED RINGERS IV SOLN
INTRAVENOUS | Status: AC
Start: 2022-01-15 — End: 2022-01-15

## 2022-01-15 MED ORDER — MELATONIN 5 MG OR TABS
10.0000 mg | ORAL_TABLET | Freq: Every evening | ORAL | Status: AC
Start: 2022-01-15 — End: 2022-01-15
  Administered 2022-01-15 (×2): 10 mg via ORAL
  Filled 2022-01-15: qty 2

## 2022-01-15 NOTE — Progress Notes (Signed)
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     Interval Events:   - Afebrile since 1/25  - Tolerated ampicillin challenge yesterday and now is on ceftriaxone and ampicillin  - Creatinine continues to improve, 1.19 today from 1.33 yesterday  - TTE without evidence of vegetations, TEE planned for Monday.    Reports minimal L flank discomfort around his PCN. No new symptoms.     Antibiotics:   Ceftriaxone 2g q12 1/25-  Ampicillin 2g q8h 1/27-    Prior  Vancomycin 1/24-  Ceftriaxone 1g q24 1/23-1/24  Daptomycin 1/25-1/27  Allergies   Allergen Reactions   . Amoxicillin Rash   . Sulfa Drugs Unspecified       Objective:  Vitals:    01/15/22  0400 01/15/22  0516 01/15/22  0615 01/15/22  0756   BP: (!) 110/57   (!) 105/53   Pulse: 50   51   Resp: '18 17 17 18   ' Temp: 97.8 F (36.6 C)   98 F (36.7 C)   SpO2: 97%   97%       GEN: Pt laying in bed, awake and alert, very talkative.  HEENT: EOMI. No scleral icterus. MMM.  CV: Well perfused, rrr no m, r, g  PULM: CTAB.   ABD:. PCN arising from L flank with no surrounding erythema. Draining clear urine. R flank slightly tender to palpation.  MSK: Warm, well perfused. No joint erythema or tenderness. No splinter hemorrhages.  SKIN: Warm, dry. No obvious rashes, lesions, or jaundice  NEURO: Face symmetric. Moving all 4 extremities, excellent coordination.    Patient Lines/Drains/Airways Status     Active PICC Line / CVC Line / PIV Line / Drain / Airway / Intraosseous Line / Epidural Line / ART Line / Line Type / Wound / Pressure Ulcer Injury     Name Placement date Placement time Site Days    Peripheral IV - 22 G Left Hand 01/12/22  2130  Hand  2    Nephrostomy -  Left #1 01/11/22  1648  Left  3    Stent -   Ureteral (left) 12/24/21  1102  Ureteral (left)  21                  Labs:   Recent Labs     01/13/22  0425 01/14/22  0610 01/15/22  0622   NA 135* 137 143   K 3.6 3.5 3.9   CL 100 103 106   BICARB '22 23 25   ' BUN 28* 18 15   CREAT 1.77* 1.33* 1.19*   [place] 8.4* 8.4* 8.0*   TP 5.6* 5.6* 5.5*   ALB 2.8* 2.7* 2.7*   TBILI 0.46 0.26 0.17   AST 19 34 78*   ALT 11 24 53*   ALK 73 95 170*       Recent Labs     01/13/22  0425 01/14/22  0610 01/15/22  0622   WBC 4.7 4.5 4.3   HGB 9.3* 9.2* 9.8*   HCT 28.9* 27.1* 30.9*   MCV 88.4 86.9 89.3   PLT 215 211 244   SEG 81 52 53  LYMPHS 11 30 32   MONOS '8 15 11   ' EOS 0 3 3       No results for input(s): PTT, INR in the last 72 hours.  CPK   Date Value Ref Range Status   01/15/2022 93 0 - 175 U/L Final   01/14/2022 190 0 - 175 U/L Final   01/13/2022 287 0 - 175 U/L Final       Micro data:  Blood  1/23 - 4/4 E faecalis  Susceptibility     Enterococcus faecalis     MIC     Ampicillin 1 mcg/mL Susceptible 1     Daptomycin 2 mcg/mL Susceptible 2     Gentamicin-Syn >500 mcg/mL Resistant 2     Linezolid <=1 mcg/mL Susceptible 2     Penicillin G 4 mcg/mL Susceptible 1     Vancomycin 1 mcg/mL Susceptible 2               1/24 - 2/4 E faecalis  1/25 - E. fecalis in 1/4 bottles growing under 24h  1/26 - No growth for 24h, pdg    Respiratory  1/24 MRSA nares - negative    Urine  1/23 - >100k E faecalis (R gent, S amp, vanc MIC 1)    1/24 L nephrostomy - >100k E faecalis     Imaging:  CT Chest 1/25  Marked improvement of multifocal pneumonia with some residual clustered nodular opacities which likely reflect airways centered infection rather than hematogenous spread. No new or worsened areas. No specific imaging features to suggest septic emboli.    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    CT Head 1/25 - no acute abnormalities    CT Abd/Pelvis 1/23  moderate left hydronephrosis, unchanged compared to 04/22/2021 CT urography.    Renal US 1/23  Moderate left  hydronephrosis, mildly increased compared to prior. Partially visualized left nephroureteral stent in the left renal pelvis.    Status post right nephrectomy.    TTE 1/27  Summary:   1. The left ventricular size is normal. The left ventricular systolic function is normal.   2. Indeterminate left ventricular diastolic function.   3. The right ventricular size is normal and systolic function is normal.   4. Aortic valve sclerosis is present.   5. Mild aortic valve regurgitation observed with color flow Doppler.   6. Dilated aortic root.   7. Pulmonary artery pressure could not be measured because there was insufficient tricuspid regurgitation.   8. No evidence of valvular vegetation noted. Would consider TEE if clinically indicated.   9. No previous study for comparison.    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     #High grade E faecalis bacteremia  #Pyelonephritis  Septic on presentation with E faecalis bacteremia. S/p PCN on 1/24. Persistently febrile, but mental status, leukocytosis, and AKI improving. Blood cultures on 1/25 grew out slower and only in the anaerobic bottle, which suggests a response to antibiotics. Recommend continuing ampicillin and ceftriaxone while awaiting additional work-up for endocarditis. TTE negative, but given high grade bacteremia, TEE is scheduled for Monday.   - Cont daily blood cultures until 1 set is negative for 48 hours  -Please continue ampicillin and ceftriaxone.   #Solitary L kidney, upper tract  urothelial cancer  #AKI  Improving.   Recommendations discussed with primary team. We will cont to follow.       Thurston Pounds, MD, FIDSA, AAHIVS, Hem Onc ID attending

## 2022-01-15 NOTE — Plan of Care (Signed)
Problem: Promotion of Health and Safety  Goal: Promotion of Health and Safety  Description: The patient remains safe, receives appropriate treatment and achieves optimal outcomes (physically, psychosocially, and spiritually) within the limitations of the disease process by discharge.    Information below is the current care plan.  Outcome: Progressing  Flowsheets  Taken 01/15/2022 1034  Individualized Interventions/Recommendations #2 (if applicable): Monitor nephrostomy tube  Individualized Interventions/Recommendations #3 (if applicable): ABX ampicillin and rocephin  Individualized Interventions/Recommendations #4 (if applicable): Monitor Pain level  Outcome Evaluation (rationale for progressing/not progressing) every shift: Pt AOx4 Calm and Cooperative. VSS. No signs of distress as this time. COntinue giving ABX. Monitor pain and will ambulate with patient in the hallway. Will continue to monitor.  Taken 01/15/2022 0840  Patient /Family stated Goal: rest, feel better  Note:

## 2022-01-15 NOTE — Plan of Care (Signed)
Problem: Promotion of Health and Safety  Goal: Promotion of Health and Safety  Description: The patient remains safe, receives appropriate treatment and achieves optimal outcomes (physically, psychosocially, and spiritually) within the limitations of the disease process by discharge.    Information below is the current care plan.  Outcome: Progressing  Flowsheets  Taken 01/15/2022 0317 by Ruben Gottron, RN  Individualized Interventions/Recommendations #1: Hourly rounding done, safety management and fall prevention implemented, bed in low position, non-skid socks placed, call light within reach at all times, kept clean and dry.  Individualized Interventions/Recommendations #2 (if applicable): Lt PCN tube intact and draining yellow cloudy urine output, good amount, will cont to monitor.  Individualized Interventions/Recommendations #3 (if applicable): Cont with ABT Rocephin and Ampicillin as ordered tolerated well, no adverse reaction, will cont to monitor.  Individualized Interventions/Recommendations #4 (if applicable): Monitor pain level, medicated with Tylenol 627m for back pain and effective. kept comfortable.  Individualized Interventions/Recommendations #5 (if applicable): Clustered care to promote rest and healing.  Outcome Evaluation (rationale for progressing/not progressing) every shift: Pt AO X4, VSS, afebrile, pain meds effective, afebrile, not in any acute respiratory distress, cont with ABT as ordered, no adverse reaction , L PCN intact and draining well, all needs attended and were met, placed call light within reach at all times, kept clean and dry. Explained POC, verbalized understanding will cont current POC.  Taken 01/14/2022 1952 by BRuben Gottron RN  Patient /Family stated Goal: to have a  good rest  Taken 01/14/2022 0012 by OLovie Macadamia RN  Guidelines: Inpatient Nursing Guidelines  Note:

## 2022-01-15 NOTE — Interdisciplinary (Signed)
Pt: Stephen [name], Kessner at rm; 308 pt is requesting for sleeping pill can we get an order, thank you.     Awaiting MD order

## 2022-01-15 NOTE — Progress Notes (Signed)
Internal Medicine Daily Progress Note    Stephen [name] 71 year old male    Subjective:  Assuming care from Dr. Lyndel Safe.  No acute events overnight.  Says he felt "crummy" this morning.  Urine is a little "crusty", but seems to be clearing up.  Bowel movements soft, diarrhea has improved.  We discussed TEE and patient is amenable.    Objective:    Vital Signs:  Temperature:  [97.5 F (36.4 C)-98.3 F (36.8 C)] 98 F (36.7 C) (01/28 0756)  Blood pressure (BP): (98-110)/(50-57) 105/53 (01/28 0756)  Heart Rate:  [50-60] 51 (01/28 0756)  Respirations:  [17-18] 18 (01/28 0756)  Pain Score: 8 (01/28 0756)  O2 Device: None (Room air) (01/28 0756)  SpO2:  [97 %-98 %] 97 % (01/28 0756)    Meds:   . allopurinol  300 mg Daily   . amLODIPINE  10 mg Daily   . ampicillin (OMNIPEN) IVPB  2,000 mg Q4H   . cefTRIAXone (ROCEPHIN) IV  2,000 mg Q12H   . docusate sodium  250 mg BID   . DULoxetine  20 mg Daily   . heparin  5,000 Units Q8H   . pregabalin  100 mg Nightly   . sodium chloride  3 mL Q8H   . tamsulosin  0.4 mg Daily     . acetaminophen  650 mg Q6H PRN   . albuterol  2.5 mg Q20 Min PRN   . aluminum-magnesium-simethicone  30 mL Q6H PRN   . diphenhydrAMINE  25 mg Once PRN   . EPINEPHrine  0.3 mg Q5 Min PRN   . hydrocortisone sodium succinate  100 mg Once PRN   . LORazepam  0.5 mg Q6H PRN   . oxyCODONE  5 mg Q12H PRN   . polyethylene glycol  17 g Daily PRN   . sodium chloride  3 mL PRN   . sodium chloride   Continuous PRN   . tizanidine  2 mg Q8H PRN       Physical Exam:  GEN: NAD  HEENT: EOMI, PERRL, MMM  HEART: RRR, normal S1/S2, no M/R/G  LUNGS: CTAB, no W/R/R  GI: non-distended, NABS, soft, non-tender to palpation  GU: left PCN draining clear yellow urine  EXT: no edema    Laboratory data:   Lab Results   Component Value Date    WBC 4.3 01/15/2022    RBC 3.46 (L) 01/15/2022    HGB 9.8 (L) 01/15/2022    HCT 30.9 (L) 01/15/2022    MCV 89.3 01/15/2022    MCHC 31.7 (L) 01/15/2022    RDW 14.8 (H) 01/15/2022    PLT 244  01/15/2022    MPV 10.3 01/15/2022    SEG 53 01/15/2022    LYMPHS 32 01/15/2022    MONOS 11 01/15/2022    EOS 3 01/15/2022    BASOS 1 01/15/2022     Lab Results   Component Value Date    NA 143 01/15/2022    K 3.9 01/15/2022    CL 106 01/15/2022    BICARB 25 01/15/2022    BUN 15 01/15/2022    CREAT 1.19 (H) 01/15/2022    GLU 106 (H) 01/15/2022     8.0 (L) 01/15/2022        Lab Results   Component Value Date    AST 78 (H) 01/15/2022    ALT 53 (H) 01/15/2022    ALK 170 (H) 01/15/2022    TP 5.5 (L) 01/15/2022  ALB 2.7 (L) 01/15/2022    TBILI 0.17 01/15/2022     No results found for: INR, PTT    I have personally reviewed the labs, images, and objective data.    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  - Cr now improving  - urology following  - IR placed L PCN 1/24  - D/ced foley 1/26    #LFT elevation  - possibly related to antibiotics (ampicillin and ceftriaxone both known to raise LFTs)  - consider imaging if continues to increase  - continue to monitor    # Urothelial cancer  - f/u with urology on 2/7 for treatment planning    #HTN - cont amlodipine 21m daily    # Gout - cont allopurinol daily, may need to dose adjust if Cr does not quickly improve    # Chronic back pain - cont duloxetine and lyrica    # Concern for Schizophrenia   - refused psych consultation, did not show overt signs of  schizophrenia    Disposition Planning  Remaining discharge needs:clinical improvement and consultant input (ID)  Discharge location:To be determined, will consult PT/OT for dispo planning  Outpatient Follow-up Needed:Urology, oncology  Estimated Discharge:> 72 hrs  Foley:L PCN  VTE prophylaxis:SQ heparin  Diet:Diet Regular  Code status:Full Code    YSheilah Pigeon MD  Medicine Attending  [facility]

## 2022-01-16 LAB — CBC WITH DIFF, BLOOD
ANC-Automated: 2.8 10*3/uL (ref 1.6–7.0)
Abs Basophils: 0 10*3/uL (ref <0.2)
Abs Eosinophils: 0.2 10*3/uL (ref 0.0–0.5)
Abs Lymphs: 1.9 10*3/uL (ref 0.8–3.1)
Abs Monos: 0.6 10*3/uL (ref 0.2–0.8)
Basophils: 0 %
Eosinophils: 3 %
Hct: 29.6 % — ABNORMAL LOW (ref 40.0–50.0)
Hgb: 10 gm/dL — ABNORMAL LOW (ref 13.7–17.5)
Lymphocytes: 36 %
MCH: 29.4 pg (ref 26.0–32.0)
MCHC: 33.8 g/dL (ref 32.0–36.0)
MCV: 87.1 um3 (ref 79.0–95.0)
MPV: 9.9 fL (ref 9.4–12.4)
Monocytes: 10 %
Plt Count: 248 10*3/uL (ref 140–370)
RBC: 3.4 10*6/uL — ABNORMAL LOW (ref 4.60–6.10)
RDW: 15 % — ABNORMAL HIGH (ref 12.0–14.0)
Segs: 51 %
WBC: 5.5 10*3/uL (ref 4.0–10.0)

## 2022-01-16 LAB — COMPREHENSIVE METABOLIC PANEL, BLOOD
ALT (SGPT): 54 U/L — ABNORMAL HIGH (ref 0–41)
AST (SGOT): 51 U/L — ABNORMAL HIGH (ref 0–40)
Albumin: 3 g/dL — ABNORMAL LOW (ref 3.5–5.2)
Alkaline Phos: 190 U/L — ABNORMAL HIGH (ref 40–129)
Anion Gap: 12 mmol/L (ref 7–15)
BUN: 11 mg/dL (ref 8–23)
Bicarbonate: 25 mmol/L (ref 22–29)
Bilirubin, Tot: 0.38 mg/dL (ref <1.2)
Calcium: 8.3 mg/dL — ABNORMAL LOW (ref 8.5–10.6)
Chloride: 106 mmol/L (ref 98–107)
Creatinine: 1.18 mg/dL — ABNORMAL HIGH (ref 0.67–1.17)
Glucose: 103 mg/dL — ABNORMAL HIGH (ref 70–99)
Potassium: 3.8 mmol/L (ref 3.5–5.1)
Sodium: 143 mmol/L (ref 136–145)
Total Protein: 5.6 g/dL — ABNORMAL LOW (ref 6.0–8.0)
eGFR Based on CKD-EPI 2021 Equation: >60 mL/min

## 2022-01-16 LAB — BLOOD CULTURE: Blood Culture Result: NO GROWTH

## 2022-01-16 MED ORDER — MELATONIN 5 MG OR TABS
5.0000 mg | ORAL_TABLET | Freq: Every evening | ORAL | Status: DC
Start: 2022-01-16 — End: 2022-01-19
  Administered 2022-01-16 – 2022-01-18 (×4): 5 mg via ORAL
  Filled 2022-01-16 (×3): qty 1

## 2022-01-16 NOTE — Plan of Care (Addendum)
Problem: Promotion of Health and Safety  Goal: Promotion of Health and Safety  Description: The patient remains safe, receives appropriate treatment and achieves optimal outcomes (physically, psychosocially, and spiritually) within the limitations of the disease process by discharge.    Information below is the current care plan.  Outcome: Progressing  Flowsheets  Taken 01/16/2022 1511  Individualized Interventions/Recommendations #1: Fall precautions in place. Frequent rounding, bed in lowest position, wheels locked, non slip foot wear, personal and call bell within reach. Encourage calling for assistance and SBA when ambulating in hallway.  Individualized Interventions/Recommendations #2 (if applicable): Maintain nephrostomy tube and monitor output.  Individualized Interventions/Recommendations #3 (if applicable): Assess and manage lower back pain, administer pharmacological and non pharmacological interventions.  Individualized Interventions/Recommendations #4 (if applicable): Maintain IV access to administer IV abx.  Individualized Interventions/Recommendations #5 (if applicable): Cluster care to minimize disruption and promote healing environment.  Taken 01/16/2022 0756  Patient /Family stated Goal: To rest        A&Ox4. VSS. Afebrile. C/o lower back pain, per pt chronic, oxycodone and zanaflex given, refer to [facility]. C/o "tired" and generalized weakness. Pt ambulated hallway with SBA and cane. Pt showered. L nephrostomy patent and draining to gravity. Urine cloudy this AM, encouraged PO intake. IV infiltrated and painful. New IV placed in R forearm. Plan is IV abx pending blood cx. Blood cx x2 collected.

## 2022-01-16 NOTE — Progress Notes (Signed)
[facility] LOS: 5 Days 17 Hours      Subjective/Interval History:  Doing ok today.  Still fatigued although he is better than when he first came in.  Hoping to be able to shower today.  No chest pain or SOB.    Objective:    Vital Signs:   Temperature:  [97.3 F (36.3 C)-98.8 F (37.1 C)] 97.3 F (36.3 C) (01/29 0756)  Blood pressure (BP): (106-129)/(49-69) 106/49 (01/29 0756)  Heart Rate:  [55-63] 57 (01/29 0756)  Respirations:  [16-18] 18 (01/29 0953)  Pain Score: Patient Sleeping, Respiratory Assessment Done (01/29 0953)  O2 Device: None (Room air) (01/29 0756)  SpO2:  [94 %-99 %] 94 % (01/29 0756)     01/28 0600 - 01/29 0559  In: 440 [P.O.:440]  Out: 1600 [Urine:1600]    Physical Exam:  GENERAL:  NAD, conversant    HEENT:  Sclera anicteric   CARDIOVASCULAR:  RRR, no m/r/g,   LUNGS: CTAB, no accessory muscle use  ABDOMEN: +BS, soft, NTTP, left PCN with yellow urine  EXTREMITIES: WWP, no edema  SKIN: No rashes noted  PSYCH: A&Ox3    Laboratory data:   Lab Results   Component Value Date    NA 143 01/16/2022    K 3.8 01/16/2022    CL 106 01/16/2022    BICARB 25 01/16/2022    BUN 11 01/16/2022    CREAT 1.18 (H) 01/16/2022    GLU 103 (H) 01/16/2022    [place] 8.3 (L) 01/16/2022     Lab Results   Component Value Date    WBC 5.5 01/16/2022    HGB 10.0 (L) 01/16/2022    HCT 29.6 (L) 01/16/2022    PLT 248 01/16/2022    SEG 51 01/16/2022    LYMPHS 36 01/16/2022    MONOS 10 01/16/2022    EOS 3 01/16/2022       Assessment and Plan:  #Sepsis d/t pyelonephritis and CAUTI and E faecalis bacteremia  # acute encephalopathy, metabolic - resolving  - surveillance blood ctx neg (1/26), repeating today.  - ID consulted  - allergy consulted for prior rash with penicillin, challenged with ampicillin and now tolerating well  - continue IV ampicillin/ceftriaxone  - discontinue daptomycin (1/28) given tolerating ampicillin  - d/ced vanco ( 1/25)  -CT scan of head and chest without contrast to evaluate for  septic emboli-->multifocal PNA but no septic emboli. 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  - Cr now improving  - urology following  - IR placed L PCN 1/24  - D/cedfoley 1/26    #LFT elevation  - possibly related to antibiotics (ampicillin and ceftriaxone both known to raise LFTs)  - If continues to rise would recommend liver US   - continue to monitor, check GGT     # Urothelial cancer  - f/u with urology on 2/7 for treatment planning    #HTN - cont amlodipine 10mg  daily    # Gout - cont allopurinol daily     # Chronic back pain - cont duloxetine and lyrica    # Concern for Schizophrenia  -refused psych consultation, did not show overt signs of schizophrenia    Disposition Planning  Remaining discharge needs:clinical improvement and consultant input (ID)  Discharge location:To be determined, will consult  PT/OT for dispo planning  Outpatient Follow-up Needed:Urology, oncology  Estimated Discharge:> 72 hrs  Foley:L PCN  VTE prophylaxis:SQ heparin  Diet:Diet Regular  Code status:Full Code      Tereso Newcomer MD  Clinical Professor  Division of [facility] Medicine  Pager [phone]  I spent 40 minutes of time on the patient care unit reviewing the patient's chart, examining the patient, communicating with the patient's care providers and/or family members and reviewing diagnostic studies.

## 2022-01-16 NOTE — Plan of Care (Signed)
Problem: Promotion of Health and Safety  Goal: Promotion of Health and Safety  Description: The patient remains safe, receives appropriate treatment and achieves optimal outcomes (physically, psychosocially, and spiritually) within the limitations of the disease process by discharge.    Information below is the current care plan.  Outcome: Progressing  Flowsheets  Taken 01/16/2022 0339 by Ruben Gottron, RN  Individualized Interventions/Recommendations #3 (if applicable): Monitor pain level, kept comfortable.  Individualized Interventions/Recommendations #4 (if applicable): Cont with ABt Ampicillin and Rocephin as ordered, no adverse reaction, will cont to monitor.  Outcome Evaluation (rationale for progressing/not progressing) every shift: Pt AOX4, verbalized understanding with POC, VSS, afebrile, no s/s of respiratory distress, skin is warm and dry to touch, Lt PCN, intact and draining well, all needs attended and were met, placed call light within reach at all times, kept clean and dry, will cont  with current POC.  Taken 01/15/2022 1942 by Ruben Gottron, RN  Patient /Family stated Goal: to have a good sleep  Taken 01/15/2022 1034 by Duke Salvia, RN  Individualized Interventions/Recommendations #2 (if applicable): Monitor nephrostomy tube  Taken 01/15/2022 0317 by Ruben Gottron, RN  Individualized Interventions/Recommendations #1: Hourly rounding done, safety management and fall prevention implemented, bed in low position, non-skid socks placed, call light within reach at all times, kept clean and dry.  Individualized Interventions/Recommendations #5 (if applicable): Clustered care to promote rest and healing.  Taken 01/14/2022 0012 by Lovie Macadamia, RN  Guidelines: Inpatient Nursing Guidelines  Note:

## 2022-01-17 ENCOUNTER — Inpatient Hospital Stay (HOSPITAL_COMMUNITY): Payer: Medicare Other

## 2022-01-17 ENCOUNTER — Encounter (HOSPITAL_COMMUNITY)
Admission: EM | Disposition: A | Discharge status: Skilled Nursing Facility | Payer: Self-pay | Attending: Internal Medicine

## 2022-01-17 DIAGNOSIS — R7401 Elevation of levels of liver transaminase levels: Secondary | ICD-10-CM

## 2022-01-17 DIAGNOSIS — B952 Enterococcus as the cause of diseases classified elsewhere: Secondary | ICD-10-CM

## 2022-01-17 DIAGNOSIS — D72829 Elevated white blood cell count, unspecified: Secondary | ICD-10-CM

## 2022-01-17 LAB — COMPREHENSIVE METABOLIC PANEL, BLOOD
ALT (SGPT): 47 U/L — ABNORMAL HIGH (ref 0–41)
AST (SGOT): 42 U/L — ABNORMAL HIGH (ref 0–40)
Albumin: 3.1 g/dL — ABNORMAL LOW (ref 3.5–5.2)
Alkaline Phos: 173 U/L — ABNORMAL HIGH (ref 40–129)
Anion Gap: 12 mmol/L (ref 7–15)
BUN: 9 mg/dL (ref 8–23)
Bicarbonate: 24 mmol/L (ref 22–29)
Bilirubin, Tot: 0.31 mg/dL (ref <1.2)
Calcium: 8.6 mg/dL (ref 8.5–10.6)
Chloride: 105 mmol/L (ref 98–107)
Creatinine: 1.17 mg/dL (ref 0.67–1.17)
Glucose: 104 mg/dL — ABNORMAL HIGH (ref 70–99)
Potassium: 3.9 mmol/L (ref 3.5–5.1)
Sodium: 141 mmol/L (ref 136–145)
Total Protein: 5.7 g/dL — ABNORMAL LOW (ref 6.0–8.0)
eGFR Based on CKD-EPI 2021 Equation: >60 mL/min

## 2022-01-17 LAB — CBC WITH DIFF, BLOOD
ANC-Automated: 3.2 10*3/uL (ref 1.6–7.0)
Abs Basophils: 0.1 10*3/uL (ref <0.2)
Abs Eosinophils: 0.2 10*3/uL (ref 0.0–0.5)
Abs Lymphs: 1.9 10*3/uL (ref 0.8–3.1)
Abs Monos: 0.6 10*3/uL (ref 0.2–0.8)
Basophils: 1 %
Eosinophils: 3 %
Hct: 30.1 % — ABNORMAL LOW (ref 40.0–50.0)
Hgb: 9.8 gm/dL — ABNORMAL LOW (ref 13.7–17.5)
Imm Gran %: 1 % — ABNORMAL HIGH (ref <1)
Imm Gran Abs: 0.1 10*3/uL — ABNORMAL HIGH (ref <0.1)
Lymphocytes: 32 %
MCH: 28.7 pg (ref 26.0–32.0)
MCHC: 32.6 g/dL (ref 32.0–36.0)
MCV: 88 um3 (ref 79.0–95.0)
MPV: 9.6 fL (ref 9.4–12.4)
Monocytes: 10 %
Plt Count: 287 10*3/uL (ref 140–370)
RBC: 3.42 10*6/uL — ABNORMAL LOW (ref 4.60–6.10)
RDW: 14.9 % — ABNORMAL HIGH (ref 12.0–14.0)
Segs: 54 %
WBC: 5.9 10*3/uL (ref 4.0–10.0)

## 2022-01-17 LAB — C-REACTIVE PROTEIN, BLOOD: CRP: 5.47 mg/dL — ABNORMAL HIGH (ref <0.5)

## 2022-01-17 LAB — GAMMA GLUTAMYL TRANSFERASE, BLOOD: GGT: 131 U/L — ABNORMAL HIGH (ref 0–60)

## 2022-01-17 LAB — PROTHROMBIN TIME, BLOOD
INR: 1.1
PT,Patient: 12.5 s (ref 9.7–12.5)

## 2022-01-17 LAB — BLOOD CULTURE: Blood Culture Result: NO GROWTH

## 2022-01-17 SURGERY — ECHOCARDIOGRAM, TRANSESOPHAGEAL, IN POSTANESTHESIA CARE UNIT (PACU)

## 2022-01-17 MED ORDER — FENTANYL CITRATE (PF) 100 MCG/2ML IJ SOLN
INTRAMUSCULAR | Status: DC | PRN
Start: 2022-01-17 — End: 2022-01-17
  Administered 2022-01-17 (×3): 25 ug via INTRAVENOUS

## 2022-01-17 MED ORDER — TRAZODONE HCL 50 MG OR TABS
25.0000 mg | ORAL_TABLET | Freq: Every evening | ORAL | Status: DC
Start: 2022-01-17 — End: 2022-01-19
  Administered 2022-01-17 – 2022-01-18 (×3): 25 mg via ORAL
  Filled 2022-01-17 (×2): qty 1

## 2022-01-17 MED ORDER — HEPARIN SODIUM LOCK FLUSH 100 UNIT/ML IJ SOLN CUSTOM
100.0000 [IU] | Freq: Once | INTRAVENOUS | Status: DC | PRN
Start: 2022-01-17 — End: 2022-01-18

## 2022-01-17 MED ORDER — LIDOCAINE HCL 1 % IJ SOLN
5.0000 mL | Freq: Once | INTRAMUSCULAR | Status: DC | PRN
Start: 2022-01-17 — End: 2022-01-18

## 2022-01-17 MED ORDER — LACTATED RINGERS IV SOLN
INTRAVENOUS | Status: DC
Start: 2022-01-17 — End: 2022-01-17

## 2022-01-17 MED ORDER — SODIUM CHLORIDE 0.9 % IJ SOLN (CUSTOM)
10.0000 mL | INTRAMUSCULAR | Status: DC | PRN
Start: 2022-01-17 — End: 2022-01-18

## 2022-01-17 MED ORDER — MIDAZOLAM HCL 5 MG/5ML IJ SOLN
INTRAMUSCULAR | Status: DC | PRN
Start: 2022-01-17 — End: 2022-01-17
  Administered 2022-01-17 (×4): 1 mg via INTRAVENOUS

## 2022-01-17 MED ORDER — MIDAZOLAM HCL 5 MG/5ML IJ SOLN
5.0000 mg | INTRAMUSCULAR | Status: AC
Start: 2022-01-17 — End: 2022-01-17
  Filled 2022-01-17: qty 5

## 2022-01-17 MED ORDER — SODIUM CHLORIDE 0.9 % IJ SOLN (CUSTOM)
10.0000 mL | Freq: Three times a day (TID) | INTRAMUSCULAR | Status: DC
Start: 2022-01-17 — End: 2022-01-18
  Administered 2022-01-17 (×2): 10 mL

## 2022-01-17 MED ORDER — FENTANYL CITRATE (PF) 50 MCG/ML IJ SOLN (WRAPPED RECORD) ~~LOC~~
100.0000 ug | INTRAMUSCULAR | Status: AC
Start: 2022-01-17 — End: 2022-01-17
  Filled 2022-01-17: qty 2

## 2022-01-17 NOTE — Plan of Care (Signed)
Problem: Promotion of Health and Safety  Goal: Promotion of Health and Safety  Description: The patient remains safe, receives appropriate treatment and achieves optimal outcomes (physically, psychosocially, and spiritually) within the limitations of the disease process by discharge.    Information below is the current care plan.  Outcome: Progressing  Flowsheets  Taken 01/16/2022 1950 by Ruben Gottron, RN  Patient /Family stated Goal: get a good sleep  Taken 01/16/2022 1511 by Emeterio Reeve, RN  Individualized Interventions/Recommendations #1: Fall precautions in place. Frequent rounding, bed in lowest position, wheels locked, non slip foot wear, personal and call bell within reach. Encourage calling for assistance and SBA when ambulating in hallway.  Individualized Interventions/Recommendations #2 (if applicable): Maintain nephrostomy tube and monitor output.  Individualized Interventions/Recommendations #3 (if applicable): Assess and manage lower back pain, administer pharmacological and non pharmacological interventions.  Individualized Interventions/Recommendations #4 (if applicable): Maintain IV access to administer IV abx.  Individualized Interventions/Recommendations #5 (if applicable): Cluster care to minimize disruption and promote healing environment.  Taken 01/16/2022 0339 by Ruben Gottron, RN  Outcome Evaluation (rationale for progressing/not progressing) every shift: Pt AOX4, verbalized understanding with POC, VSS, afebrile, no s/s of respiratory distress, skin is warm and dry to touch, Lt PCN, intact and draining well, all needs attended and were met, placed call light within reach at all times, kept clean and dry, will cont  with current POC.  Taken 01/14/2022 0012 by Lovie Macadamia, RN  Guidelines: Inpatient Nursing Guidelines  Note:

## 2022-01-17 NOTE — Interdisciplinary (Signed)
01/17/22 1539   Follow Up/Progress   Is the Patient Medically Stable for Discharge * No   Where was the patient admitted from? * Home   Barriers to Discharge * None   Anticipated Discharge Disposition/Needs Home with Family;SNF;HH RN;IV infusion/antibiotics   Post Acute Services Referred To TBD   Family/Caregiver's Assessed for * Not Applicable   Respite Care * Not Applicable   Patient/Family/Other Are In Agreement With Discharge Plan * To be determined   Public Health Clearance Needed * Not Applicable   Anticipated DischargeTransportation *  Car   Anticipated Discharge Transportation Details TBD   01/17/22  3:40 PM    Medical Intervention(s) requiring continued Hospital Stay:  sepsis secondary to CAUTI leading to E. Faecalis bacteremia as well as AKI secondary to obstructive uropathy s/p left PCN placement.     IV abx, TEE today, ID recs once TEE performed  PT/OT eval    Anticipated discharge plan/needs:  Home vs SNF pending ID recs       Barriers to Discharge:  none    Alberteen Sam, RN  Care Manager

## 2022-01-17 NOTE — Progress Notes (Signed)
Cardiology Procedural Sedation    Sedation Plan: Moderate sedation is planned for this procedure.    Airway History & Assessment: None    ASA Score:  3    ASA 1: Healthy patient without organic, biochemical, or psychiatric disease   ASA 2: A patient with mild systemic disease. No significant impact on daily activity. Unlikely impact on anesthesia or surgery.   ASA 3: Significant or severe systemic disease that limits normal activity. Significant impact on daily activity.Likely impact on anesthesia or surgery.    ASA 4: Severe disease that is a constant threat to life or requires intensive therapy. Serious limitation of daily activity. Major impact on anesthesia and surgery.    ASA 5: Moribund patient who is likely to die in the next 24 hours with or without surgery.    ASA 6: Brain-dead organ donor.     Airway (Mallimpati) Score:  Class II - Soft palate, uvula, and fauces are visible.   Class I: Soft palate, uvula, fauces, and pillars are visible   Class II: Soft palate, uvula, and fauces are visible   Class III: Soft palate and base of the uvula are visible    Class IV: Only the hard palate visible     I have assessed the patient's status prior to this procedure.      Forestine Chute, MD  Cardiology Fellow, PGY-4

## 2022-01-17 NOTE — Plan of Care (Signed)
Problem: Promotion of Health and Safety  Goal: Promotion of Health and Safety  Description: The patient remains safe, receives appropriate treatment and achieves optimal outcomes (physically, psychosocially, and spiritually) within the limitations of the disease process by discharge.    Information below is the current care plan.  Outcome: Progressing  Flowsheets  Taken 01/17/2022 1746 by Lincoln Brigham, RN  Guidelines: Inpatient Nursing Guidelines  Taken 01/17/2022 0800 by Lincoln Brigham, RN  Patient /Family stated Goal: get well  Taken 01/16/2022 1511 by Emeterio Reeve, RN  Individualized Interventions/Recommendations #1: Fall precautions in place. Frequent rounding, bed in lowest position, wheels locked, non slip foot wear, personal and call bell within reach. Encourage calling for assistance and SBA when ambulating in hallway.  Individualized Interventions/Recommendations #2 (if applicable): Maintain nephrostomy tube and monitor output.  Individualized Interventions/Recommendations #3 (if applicable): Assess and manage lower back pain, administer pharmacological and non pharmacological interventions.  Taken 01/16/2022 0339 by Ruben Gottron, RN  Outcome Evaluation (rationale for progressing/not progressing) every shift: Pt AOX4, verbalized understanding with POC, VSS, afebrile, no s/s of respiratory distress, skin is warm and dry to touch, Lt PCN, intact and draining well, all needs attended and were met, placed call light within reach at all times, kept clean and dry, will cont  with current POC.  Note:

## 2022-01-17 NOTE — Progress Notes (Signed)
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     Interval Events:   - Afebrile since 1/25  - NPO for TEE this afternoon    Stephen [name] reports feeling well overall today. He notes having some diarrhea and abd discomfort. He also has ongoing chronic back pain. He denies having any issues with his PCN. No other concerns today.     Antibiotics:   Ceftriaxone 2g q12 1/25-  Ampicillin 2g q8h 1/27-    Prior  Vancomycin 1/24-  Ceftriaxone 1g q24 1/23-1/24  Daptomycin 1/25-1/27    Allergies   Allergen Reactions   . Amoxicillin Rash   . Sulfa Drugs Unspecified       Objective:  Vitals:    01/16/22  1950 01/17/22  0450 01/17/22  0752 01/17/22  0856   BP: (!) 113/58 121/61 (!) 103/52 (!) (P) 107/55   Pulse: 53 51 58 (P) 55   Resp: _0 Temp: 97.3 F (36.3 C) 97.3 F (36.3 C) 97.4 F (36.3 C)    SpO2: 99% 96% 97%        GEN: Pt laying in bed, awake and alert  HEENT: EOMI. No scleral icterus. MMM.  CV: Well perfused  PULM: Normal WOB on RA, no cough  ABD. PCN arising from L flank with no surrounding erythema. Draining clear urine.   MSK: Warm, well perfused. No joint erythema or tenderness. No splinter hemorrhages.  SKIN: Warm, dry. No obvious rashes, lesions, or jaundice  NEURO: Face symmetric. Moving all 4 extremities, excellent coordination.    Patient Lines/Drains/Airways Status     Active PICC Line / CVC Line / PIV Line / Drain / Airway / Intraosseous Line / Epidural Line / ART Line / Line Type / Wound / Pressure Ulcer Injury     Name Placement date Placement time Site Days    Peripheral IV - 22 G Right Forearm 01/16/22  1506  Forearm  less than 1    Nephrostomy -  Left #1 01/11/22  1648  Left  5    Stent -  Ureteral (left) 12/24/21  1102  Ureteral  (left)  23                  Labs:   Recent Labs     01/15/22  0622 01/16/22  0617 01/17/22  0615   NA 143 143 141   K 3.9 3.8 3.9   CL 106 106 105   BICARB _1 BUN _2 CREAT 1.19* 1.18* 1.17   [place] 8.0* 8.3* 8.6   TP 5.5* 5.6* 5.7*   ALB 2.7* 3.0* 3.1*   TBILI 0.17 0.38 0.31   AST 78* 51* 42*   ALT 53* 54* 47*   ALK 170* 190* 173*       Recent Labs     01/15/22  0622 01/16/22  0617 01/17/22  0615   WBC 4.3 5.5 5.9   HGB 9.8* 10.0* 9.8*   HCT 30.9* 29.6* 30.1*   MCV 89.3 87.1 88.0   PLT 244 248 287   SEG 53 51 54   LYMPHS 32 36 32  MONOS _0 EOS _1 Recent Labs     01/17/22  0615   INR 1.1     CPK   Date Value Ref Range Status   01/15/2022 93 0 - 175 U/L Final   01/14/2022 190 0 - 175 U/L Final   01/13/2022 287 0 - 175 U/L Final       Micro data:  Blood  1/23 - 4/4 E faecalis  Susceptibility     Enterococcus faecalis     MIC     Ampicillin 1 mcg/mL Susceptible 1     Daptomycin 2 mcg/mL Susceptible 2     Gentamicin-Syn >500 mcg/mL Resistant 2     Linezolid <=1 mcg/mL Susceptible 2     Penicillin G 4 mcg/mL Susceptible 1     Vancomycin 1 mcg/mL Susceptible 2               1/24 - 2/4 E faecalis  1/25 - E. fecalis in 1/4 bottles growing under 24h  1/26 - NGTD  1/29 - NGTD    Respiratory  1/24 MRSA nares - negative    Urine  1/23 - >100k E faecalis (R gent, S amp, vanc MIC 1)    1/24 L nephrostomy - >100k E faecalis     Imaging:  CT Chest 1/25  Marked improvement of multifocal pneumonia with some residual clustered nodular opacities which likely reflect airways centered infection rather than hematogenous spread. No new or worsened areas. No specific imaging features to suggest septic emboli.    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    CT Head 1/25 - no acute abnormalities    CT Abd/Pelvis 1/23  moderate left hydronephrosis, unchanged compared to 04/22/2021 CT urography.    Renal US 1/23  Moderate left hydronephrosis, mildly increased compared to prior. Partially  visualized left nephroureteral stent in the left renal pelvis.    Status post right nephrectomy.    TTE 1/27  Summary:   1. The left ventricular size is normal. The left ventricular systolic function is normal.   2. Indeterminate left ventricular diastolic function.   3. The right ventricular size is normal and systolic function is normal.   4. Aortic valve sclerosis is present.   5. Mild aortic valve regurgitation observed with color flow Doppler.   6. Dilated aortic root.   7. Pulmonary artery pressure could not be measured because there was insufficient tricuspid regurgitation.   8. No evidence of valvular vegetation noted. Would consider TEE if clinically indicated.   9. No previous study for comparison.    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     #High grade E faecalis bacteremia  #Pyelonephritis  S/p PCN on 1/24. Blood cultures on 1/25 grew out slower and only in the anaerobic bottle, which suggests a response to antibiotics. Recommend continuing ampicillin and ceftriaxone while awaiting additional work-up for endocarditis. TTE negative, but given high grade bacteremia, TEE is scheduled for today  - Continue ampicillin and ceftriaxone  - F/u TEE; if negative, can narrow to ampicillin alone with anticipated 2 week course from first negative blood culture  - Can stop getting daily blood cultures    #Solitary L kidney, upper tract urothelial cancer  #  AKI - Improved    Recommendations discussed with primary team. We will cont to follow. Final abx recs pending TEE results.     Patient discussed with ID attending, Dr. Jake Michaelis, who agrees with the above assessment and plan.     Louis Matte, MD  Fellow, Division of Infectious Diseases  [facility]      ID Attending note    I have seen and  examined the patient. I reviewed and edited the fellow's note, Dr. Malcolm Metro and agree with the documented findings and plan of care, which reflects our discussion.    High-grade Enterococcus faecalis bacteremia secondary to complicated UTI in the setting of single kidney, now with left PCN placed on 1/24 for obstructive uropathy in the setting of sepsis.  Daily fever and positive blood culture on 01/24 and 25th while on ceftriaxone (no Enterococcus faecalis activity), 1 dose daptomycin on 01/26, then started on ampicillin on 01/27 in addition to ceftriaxone, patient has been afebrile since 1/26, 1st negative blood culture on 01/26-TTE negative for vegetation, pending TEE today, although his persistent bacteremia was prior to initiation of appropriate antibiotics and less likely indicative of deep-seated infection.  Leukocytosis resolved, kidney function within normal limits, mildly elevated liver enzymes noted.  Please see fellow's note for more details.    Janne Napoleon, MD MPH  Infectious Diseases Attending         This document was created using a voice recognition transcribing system. Incorrect words or phrases may have been missed during proofreading. Please interpret accordingly.   I spent a total of 50 minutes exclusive of separately reportable services caring for this patient today to include reviewing and ordering labs and other diagnostic testing, reviewing their medical records, seeing the patient, communicating with the patient's care providers,  and documenting in the medical record.

## 2022-01-17 NOTE — Progress Notes (Signed)
Medicine Attending Daily Progress Note    ID: Stephen [name] is a 71 year old male with a pmhx HTN, depression, urothelial cancer s/p chemotherapy and surgery, solitary L kidney w chronic indwelling foley catheter who p/w rigors, found to have sepsis secondary to CAUTI leading to E. Faecalis bacteremia as well as AKI secondary to obstructive uropathy s/p left PCN placement.     Interval events:   No acute events overnight    Subjective:   Doing well this AM, states he is just waiting for the TEE.  Does endorse some constipation but does not want any laxatives.     Objective:   Meds:  . allopurinol  300 mg Daily   . amLODIPINE  10 mg Daily   . ampicillin (OMNIPEN) IVPB  2,000 mg Q4H   . cefTRIAXone (ROCEPHIN) IV  2,000 mg Q12H   . docusate sodium  250 mg BID   . DULoxetine  20 mg Daily   . heparin  5,000 Units Q8H   . melatonin  5 mg HS   . pregabalin  100 mg Nightly   . sodium chloride  3 mL Q8H   . tamsulosin  0.4 mg Daily     . acetaminophen  650 mg Q6H PRN   . albuterol  2.5 mg Q20 Min PRN   . aluminum-magnesium-simethicone  30 mL Q6H PRN   . LORazepam  0.5 mg Q6H PRN   . oxyCODONE  5 mg Q12H PRN   . polyethylene glycol  17 g Daily PRN   . sodium chloride  3 mL PRN   . sodium chloride   Continuous PRN   . tizanidine  2 mg Q8H PRN      . sodium chloride         Vitals:    Temp  Avg: 97.6 F (36.4 C)  Min: 97.3 F (36.3 C)  Max: 98.2 F (36.8 C)  Pulse  Avg: 53.8  Min: 51  Max: 58  BP  Min: 103/52  Max: 121/61  No data recorded  Resp  Avg: 17.5  Min: 17  Max: 18  SpO2  Avg: 97.8 %  Min: 96 %  Max: 99 %     I/O:  Intake/Output                       01/16/22 0600 - 01/17/22 0559 01/17/22 0600 - 01/18/22 0559     [id number] 1478-2956 Total 0600-1759 2130-8657 Total                 Intake    P.O.  920  240 1160  --  -- --    I.V.  203  50 253  100  -- 100    Total Intake 1123 290 1413 100 -- 100       Output    Urine  1575  1450 3025  1050  -- 1050    Total Output 1575 1450 3025 1050 -- 1050       Net I/O     -452  -1160 -1612 -950 -- -950          Physical Exam  General: NAD, appears comfortable laying in bed  HEENT: NCAT, MMM, oropharynx wnl  CV: Normal S1S2, RRR, no m/r/g  Lungs: CTAB, no w/r/r  Abdomen: Soft, NT/ND; Normoactive BS; left PCN tube in place  Extremities: No clubbing, cyanosis, edema  Neuro: AAOX4  Skin: No rashes, skin breakdown  Labs  Recent Labs     01/16/22  0617 01/17/22  0615   WBC 5.5 5.9   HGB 10.0* 9.8*   HCT 29.6* 30.1*   PLT 248 287   NA 143 141   K 3.8 3.9   CL 106 105   BICARB 25 24   BUN 11 9   CREAT 1.18* 1.17   GLU 103* 104*   [place] 8.3* 8.6   TBILI 0.38 0.31   ALK 190* 173*   AST 51* 42*   ALT 54* 47*   ALB 3.0* 3.1*   PT  --  12.5   INR  --  1.1   CRP  --  5.47*       Microbiology  UCx: E. Faecalis  BCx (1/25): 1/4 bottles E. Faecalis  BCx (1/26): NGTD    Imaging  TTE:   1. The left ventricular size is normal. The left ventricular systolic function is normal.   2. Indeterminate left ventricular diastolic function.   3. The right ventricular size is normal and systolic function is normal.   4. Aortic valve sclerosis is present.   5. Mild aortic valve regurgitation observed with color flow Doppler.   6. Dilated aortic root.   7. Pulmonary artery pressure could not be measured because there was insufficient tricuspid regurgitation.   8. No evidence of valvular vegetation noted. Would consider TEE if clinically indicated.   9. No previous study for comparison.    Data: I have reviewed all labs, imaging, micro, EKG, and interpreted them myself. I have also reviewed all consultations and spoken to consulting teams personally.     Assessment/Plan:   The patient is a 71 year old male with a pmhx HTN, depression, urothelial cancer s/p chemotherapy and surgery, solitary L kidney w chronic indwelling foley catheter who p/w rigors, found to have sepsis secondary to CAUTI leading to E. Faecalis bacteremia as well as AKI secondary to obstructive uropathy s/p left PCN placement.     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   -L PCN placed on 1/24  -foley removed on 1/26    # Elevated LFTs  -attributed to abx, stable and trending down    # HTN  -cont amlodipine    # Gout  -cont allopurinol    # Chronic back pain  -cont duloxetine and lyrica    # Concern for schizophrenia  -patient refused psych consultation; has not exhibited any overt signs of schizophrenia     # FEN/GI: NPO for TEE  # PPX: SQH  # DISPO: pending ID recs; anticipate prolonged IV abx  Orders Placed This Encounter      Full Code - Call Code      Albertha Ghee, MD  Assistant Clinical Professor  Division of [facility] Medicine  Pager [phone]

## 2022-01-17 NOTE — Interdisciplinary (Signed)
PT Contact     Row Name 01/17/22 1730       Therapy Contact Note    Contact Time 7218    Therapy not provided at this time as Patient at functional baseline and has no skilled need for therapy.    Additional Comments Patient is up ad-lib in the room

## 2022-01-18 ENCOUNTER — Inpatient Hospital Stay (HOSPITAL_BASED_OUTPATIENT_CLINIC_OR_DEPARTMENT_OTHER): Payer: Medicare Other

## 2022-01-18 DIAGNOSIS — Z452 Encounter for adjustment and management of vascular access device: Secondary | ICD-10-CM

## 2022-01-18 LAB — BLOOD CULTURE
Blood Culture Result: NO GROWTH
Blood Culture Result: NO GROWTH

## 2022-01-18 MED ORDER — HEPARIN SODIUM LOCK FLUSH 100 UNIT/ML IJ SOLN CUSTOM
100.0000 [IU] | Freq: Once | INTRAVENOUS | Status: DC | PRN
Start: 2022-01-18 — End: 2022-01-19

## 2022-01-18 MED ORDER — THERA M PLUS OR TABS
1.0000 | ORAL_TABLET | Freq: Every day | ORAL | Status: DC
Start: 2022-01-18 — End: 2022-01-19
  Administered 2022-01-18 – 2022-01-19 (×3): 1 via ORAL
  Filled 2022-01-18 (×2): qty 1

## 2022-01-18 MED ORDER — LIDOCAINE HCL 1 % IJ SOLN
5.0000 mL | Freq: Once | INTRAMUSCULAR | Status: AC | PRN
Start: 2022-01-18 — End: 2022-01-19

## 2022-01-18 MED ORDER — SODIUM CHLORIDE 0.9 % IJ SOLN (CUSTOM)
10.0000 mL | INTRAMUSCULAR | Status: DC | PRN
Start: 2022-01-18 — End: 2022-01-19

## 2022-01-18 MED ORDER — SODIUM CHLORIDE 0.9 % IJ SOLN (CUSTOM)
10.0000 mL | Freq: Three times a day (TID) | INTRAMUSCULAR | Status: DC
Start: 2022-01-18 — End: 2022-01-19
  Administered 2022-01-18 – 2022-01-19 (×3): 10 mL

## 2022-01-18 NOTE — Interdisciplinary (Signed)
01/18/22 1608   Assessment   Assessment Type Progress/Follow-up   Referral Information   Referral Type Discharge Planning   Plans/Interventions/Discharge   Anticipated Discharge Destination SNF     SW follow up note for long hospital stay    SW met with pt at bed side and re-introduced self and role. Pt was A&O X4 and verbalized agreement in talking to SW. Pt appeared much more alert than when this SW assessed him upon admission in the ED.   Pt was able to answer all SW questions without hesitation nor pauses. Pt thought process appeared to be linear and clear.    Information: pt is a single, [place] speaking, Caucasian, 71 y/o male who was admitted 2/2 shaking and cloudy urine. PMH includes: HTN, depression, arthritis, urothelial O'Neill s/p chemo and surgery, solitary L kidney and chronic indwelling foley.    Pt lives with his sister, [name], and her spouse, Simona Huh. Pt was using a cane before the admission and states he was able to drive. Pt receives $1000.00/month through [employer] income and EBT.     Advanced healthcare directives form: on file uploaded. Pt's sister is his DPOA for healthcare.     Dispo: SNF short term. CM arranging it.     Coping: pt verbalized coping appropriately with this unplanned hospitalization. He verbalized appreciation for SW f/u and sheared that he has already spoken to the CM about the next level of care.    No other SW needs identified at this time.     Beverely Risen, LCSW  Ph: [phone]  Pager: [place] pager: [phone]

## 2022-01-18 NOTE — Progress Notes (Signed)
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     Interval Events:   - Afebrile since 1/25  - TEE without vegetations    Stephen [name] reports feeling well today. He denies having any issues with his PCN, no new pain.     Antibiotics:   Ceftriaxone 2g q12 1/25-  Ampicillin 2g q8h 1/27-    Prior  Vancomycin 1/24-  Ceftriaxone 1g q24 1/23-1/24  Daptomycin 1/25-1/27    Allergies   Allergen Reactions   . Amoxicillin Rash   . Sulfa Drugs Unspecified       Objective:  Vitals:    01/17/22  1606 01/17/22  1940 01/18/22  0417 01/18/22  0420   BP: (!) 91/52 (!) 115/58 (!) 92/49 (!) 94/54   Pulse: 69 61 51 51   Resp: _0 Temp: 97.6 F (36.4 C) 97.3 F (36.3 C) 97.4 F (36.3 C)    SpO2: 96% 99% 94%        GEN: Pt sitting up on the edge of his bed in NAD.   HEENT: EOMI. No scleral icterus. MMM.  CV: Well perfused  PULM: Normal WOB on RA, no cough  ABD. PCN arising from L flank with no surrounding erythema. Draining clear urine.   MSK: Warm, well perfused. No joint erythema or tenderness. No splinter hemorrhages.  SKIN: Warm, dry. No obvious rashes, lesions, or jaundice  NEURO: Face symmetric. Moving all 4 extremities, excellent coordination.    Patient Lines/Drains/Airways Status     Active PICC Line / CVC Line / PIV Line / Drain / Airway / Intraosseous Line / Epidural Line / ART Line / Line Type / Wound / Pressure Ulcer Injury     Name Placement date Placement time Site Days    Peripheral IV - 22 G Right Forearm 01/16/22  1506  Forearm  1    Nephrostomy -  Left #1 01/11/22  1648  Left  6    Stent -  Ureteral (left) 12/24/21  1102  Ureteral (left)  24                  Labs:   Recent Labs     01/16/22  0617 01/17/22  0615   NA 143 141   K 3.8 3.9   CL  106 105   BICARB 25 24   BUN 11 9   CREAT 1.18* 1.17   [place] 8.3* 8.6   TP 5.6* 5.7*   ALB 3.0* 3.1*   TBILI 0.38 0.31   AST 51* 42*   ALT 54* 47*   ALK 190* 173*       Recent Labs     01/16/22  0617 01/17/22  0615   WBC 5.5 5.9   HGB 10.0* 9.8*   HCT 29.6* 30.1*   MCV 87.1 88.0   PLT 248 287   SEG 51 54   LYMPHS 36 32   MONOS 10 10   EOS 3 3       Recent Labs     01/17/22  0615   INR 1.1     CPK   Date Value Ref Range Status   01/15/2022  93 0 - 175 U/L Final   01/14/2022 190 0 - 175 U/L Final   01/13/2022 287 0 - 175 U/L Final       Micro data:  Blood  1/23 - 4/4 E faecalis  Susceptibility     Enterococcus faecalis     MIC     Ampicillin 1 mcg/mL Susceptible 1     Daptomycin 2 mcg/mL Susceptible 2     Gentamicin-Syn >500 mcg/mL Resistant 2     Linezolid <=1 mcg/mL Susceptible 2     Penicillin G 4 mcg/mL Susceptible 1     Vancomycin 1 mcg/mL Susceptible 2               1/24 - 2/4 E faecalis  1/25 - E. fecalis in 1/4 bottles growing under 24h  1/26 - NGTD  1/29 - NGTD    Respiratory  1/24 MRSA nares - negative    Urine  1/23 - >100k E faecalis (R gent, S amp, vanc MIC 1)    1/24 L nephrostomy - >100k E faecalis     Imaging:  CT Chest 1/25  Marked improvement of multifocal pneumonia with some residual clustered nodular opacities which likely reflect airways centered infection rather than hematogenous spread. No new or worsened areas. No specific imaging features to suggest septic emboli.    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    CT Head 1/25 - no acute abnormalities    CT Abd/Pelvis 1/23  moderate left hydronephrosis, unchanged compared to 04/22/2021 CT urography.    Renal US 1/23  Moderate left hydronephrosis, mildly increased compared to prior. Partially visualized left nephroureteral stent in the left renal pelvis.    Status post right nephrectomy.    TTE 1/27  Summary:   1. The left ventricular size is normal. The left ventricular systolic function is normal.   2. Indeterminate left  ventricular diastolic function.   3. The right ventricular size is normal and systolic function is normal.   4. Aortic valve sclerosis is present.   5. Mild aortic valve regurgitation observed with color flow Doppler.   6. Dilated aortic root.   7. Pulmonary artery pressure could not be measured because there was insufficient tricuspid regurgitation.   8. No evidence of valvular vegetation noted. Would consider TEE if clinically indicated.   9. No previous study for comparison.    TEE 1/31   1. The left ventricular size is normal. The left ventricular systolic function is normal.   2. The right ventricular size is normal and systolic function is normal.   3. No evidence of any gross valvular vegetations or perivalvular abscess   4. Compared to prior study , no significant change compared with prior TTE.    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     #High grade E faecalis bacteremia  # urosepsis  #Complicated UTI/ Pyelonephritis  S/p PCN on 1/24 for obstructive uropathy in the setting of sepsis. E faecalis grew from urine and blood. TTE and TEE negative yesterday; therefore, recommend treating E faecalis bacteremia/pyelo for 2 weeks. Favor starting from initiation of ampicllin as he was on ceftriaxone alone at beginning of hospitalization, which does not provide adequate coverage. Can narrow to ampicllin alone now that endocarditis has been ruled out.  -  Can d/c ceftriaxone  - Cont ampicillin alone for 2 week course from initiation of ampicillin (1/27 - 2/9)   - Ok for PICC- PICC line can be removed after last doe of antibiotic   - OPAT recs below   - Weekly labs and ID f/u at end of abx course    --  OPAT Recommendations:     1. Drug: Ampicillin continuous infusion preferred if possible by [facility] otherwise 2g  q4hrs  2. Duration: 2 weeks (1/27 - 2/9)  3. Monitoring Labs: Please obtain weekly CMP, CBC w/diff, and fax results to [phone] ATTN: OPAT MD  4. Please enroll the patient in the OPAT (Outpatient Parenteral Antibiotic Therapy) program through the Epic inpatient OPAT order set (enter "OPAT" in order sets)    It is essential for monitoring efficacy and adverse effects of therapy that the above labs are ordered. Please ensure that the above orders are placed prior to discharge.   --    #Solitary L kidney, upper tract urothelial cancer  #AKI - Improved    Recommendations discussed with primary team. We will sign off at this time. Please, do not hesitate to reach out if you have any additional questions.     Patient discussed with ID attending, Dr. Jake Michaelis, who agrees with the above assessment and plan.     Louis Matte, MD  Fellow, Division of Infectious Diseases  [facility]    ID Attending note    I have seen and examined the patient. I reviewed and edited the fellow's note, Dr. Malcolm Metro and agree with the documented findings and plan of care, which reflects our discussion.      Was informed by the primary team that SNF is not accepting patient with ampicillin due to frequent dosing, ( would not accept continuous infusion as well), recommended daptomycin 57m/kg IV daily with checking CPK at baseline then weekly while on daptomycin.  Would avoid vancomycin if possible due to recent AKI in the setting of single kidney.  Primary team to contact case manager regarding ampicillin as well as daptomycin.  Please see fellow's note for more details.      MJanne Napoleon MD MPH  Infectious Diseases Attending         This document was created using a voice recognition transcribing system. Incorrect words or phrases may have been missed during proofreading. Please interpret accordingly.   I spent a total of 50 minutes exclusive of separately reportable services caring for this patient today to include reviewing and  ordering labs and other diagnostic testing, reviewing their medical records, seeing the patient, communicating with the patient's care providers,  and documenting in the medical record.

## 2022-01-18 NOTE — Interdisciplinary (Signed)
DCP:      This CM met with pt at bedside to discuss dcp to SNF to finish IV abx.  Pt agreeable to SNF and does not have preference in facility.  Task sent to [facility].  Pt ready for dc 01/19/22.  PICC line in place.     Alberteen Sam, RN  Care Manager

## 2022-01-18 NOTE — Progress Notes (Signed)
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     Subjective / Interval Events   Doing well overall   TEE results from yesterday noted, no valvular issues.  Need PICC line placement for IV ABX until 01/27/2022  Planning for SNF placement likely tomorrow     Objective   Temperature:  [97.3 F (36.3 C)-97.8 F (36.6 C)] 97.8 F (36.6 C) (01/31 1600)  Blood pressure (BP): (92-118)/(49-59) 118/59 (01/31 1600)  Heart Rate:  [51-62] 62 (01/31 1600)  Respirations:  [18] 18 (01/31 1600)  Pain Score: 4 (01/31 0944)  O2 Device: None (Room air) (01/31 0417)  SpO2:  [94 %-99 %] 97 % (01/31 1600)  01/30 0600 - 01/31 0559  In: 980 [P.O.:480; I.V.:500]  Out: 3380 [Urine:3380]      Physical Exam  Const: Sitting at bedside, not in pain or acute distress  Head: Normo Cephalic and atraumatic, neck is supple, no JVD  Eyes: Conjunctivae and lids normal  CV: Regular rhythm and normal rate, no murmur/gallop, no leg edema, warm extremities  Resp: Normal effort, Clear to ausculation bilaterally, no wheezes/rales/rhonchi  GI: Abdomen is soft, nondistended, non tender, normoactive bowel sounds  GU: Voids, Lft PCN to gravity bag  MSK: No clubbing or cyanosis, no deformities  Skin: No rashes, lesions or ulcers  Neuro: A/O x3, no facial asymmetry, speech is clear, 5/5 strength in all four extremities    Pertinent Labs & Imaging     Na 141 (01/30) CL 105 (01/30) BUN 9 (01/30) GLU   104* (01/30)   K 3.9 (01/30) CO2 24 (01/30) Cr 1.17 (01/30)      WBC 5.9 (01/30) HGB 9.8* (01/30) PLT 287 (01/30)    HCT 30.1* (01/30)      X-Ray Chest Frontal And Lateral    Result Date: 01/10/2022  IMPRESSION: No acute findings. No pneumonia.    X-Ray Chest Single View    Result Date:  01/18/2022  IMPRESSION: Right arm PICC followed to the mid superior vena cava, in good position. New linear right greater left basal opacities are suggestive of subsegmental atelectasis. However, early sequela of aspiration or developing multifocal pneumonia possible in the appropriate clinical context. Recommend continued attention on follow-up radiographs.    IR Nephrostomy Tube    Result Date: 01/11/2022  IMPRESSION:   Technically successful image-guided left percutaneous nephrostomy tube placement.     CT Head W/O Contrast    Result Date: 01/12/2022  IMPRESSION: 1. No acute intracranial abnormality.    CT Chest W/O Contrast    Result Date: 01/12/2022  IMPRESSION: Marked improvement of multifocal pneumonia with some residual clustered nodular opacities which likely reflect airways centered infection rather than hematogenous spread. No new or worsened areas. No specific imaging features to suggest septic emboli. 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     CT Abdomen And Pelvis W/O Contrast    Result Date: 01/11/2022  IMPRESSION:   CT scan of the abdomen and pelvis without IV contrast. moderate left hydronephrosis, unchanged compared to 04/22/2021 CT urography. Note that assessment of solid organs is limited in the absence of intravenous contrast.     US Kidney Complete    Result Date: 01/10/2022  IMPRESSION: Moderate left hydronephrosis, mildly increased  compared to prior. Partially visualized left nephroureteral stent in the left renal pelvis. Status post right nephrectomy.        Current Medications     Scheduled Medications  . allopurinol  300 mg Daily   . amLODIPINE  10 mg Daily   . ampicillin (OMNIPEN) IVPB  2,000 mg Q4H   . cefTRIAXone (ROCEPHIN) IV  2,000 mg Q12H   . docusate sodium  250 mg BID   . DULoxetine  20 mg Daily   . heparin  5,000 Units Q8H   . melatonin  5 mg HS   . multivitamin with minerals  1 tablet Daily   . pregabalin  100 mg Nightly   . sodium chloride  10 mL Q8H   . sodium  chloride  3 mL Q8H   . tamsulosin  0.4 mg Daily   . traZODone  25 mg HS     Continuous Medications  . sodium chloride       PRN Medications  . acetaminophen  650 mg Q6H PRN   . albuterol  2.5 mg Q20 Min PRN   . aluminum-magnesium-simethicone  30 mL Q6H PRN   . heparin  100 Units Once PRN   . heparin  100 Units Once PRN   . lidocaine  5 mL Once PRN   . LORazepam  0.5 mg Q6H PRN   . oxyCODONE  5 mg Q12H PRN   . polyethylene glycol  17 g Daily PRN   . sodium chloride  10 mL PRN   . sodium chloride  3 mL PRN   . sodium chloride   Continuous PRN   . tizanidine  2 mg Q8H PRN       Hospital Problem List   Active Hospital Problems    Diagnosis POA   . *Sepsis (CMS-HCC) [A41.9] Yes   . Urinary tract infection associated with indwelling urethral catheter (CMS-HCC) [T83.511A, N39.0] Yes   . AKI (acute kidney injury) (CMS-HCC) [N17.9] Yes   . Ureteral obstruction [N13.5] Yes   . Hydronephrosis [N13.30] Yes   . Pyelonephritis [N12] Yes   . Malignant neoplasm of urinary bladder, unspecified site (CMS-HCC) [C67.9] Yes      Resolved Hospital Problems   No resolved problems to display.       Assessment / Plan   Stephen [name] is a 71 year old male with a pmhx HTN, depression, urothelial cancer s/p chemotherapy and surgery, solitary L kidney w chronic indwelling foley catheter who p/w rigors, found to have sepsis secondary to CAUTI leading to E. Faecalis bacteremia as well as AKI secondary to obstructive uropathy s/p left PCN placement.    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   - Status post left PCN placed on 1/24 by IR  - Indwelling foley removed on 1/26  - Improved AKI with resolution of obstruction  - Urology consult noted  - Outpatient Urology follow up    #  Urothelial cancer  - Follow up with urology on 2/7 for treatment planning as outpatient    # Elevated LFTs- Improving  - May be attributed to abx,   - Monitor LFTs, stable and trending down    # Primary HTN  - Continue amlodipine    # Gout  -Continue allopurinol    # Normocytic chronic Anemia  - MVI with mineral supplements  - Check Iron, B12, Folate, ferritin in am      # Chronic back pain  -Continue duloxetine and lyrica    # Concern for schizophrenia  - Patient refused psych consultation; has not exhibited any overt signs of schizophrenia during the stay    Level of Care: [facility]   Disposition Planning 01/18/2022:  Discharge barriers: clinical improvement  Discharge location: Skilled nursing facility  Outpatient Referrals Needed: PCP, ID, Urology  Estimated Discharge: 24-48 hours        Foley: PCN on the left  VTE prophylaxis: SQ heparin heparin-100 Units,heparin-100 Units,heparin-5,000 Units  Code status: Full Code Last BM: 01/15/22 0300   IV fluids: No IV fluids  Access: PIVs  Diet: Diet Regular     I spent 55 minutes of time on the patient care unit reviewing the patient's chart, examining the patient, communicating with the patient's care providers and/or family members and reviewing diagnostic studies.    Tawny Asal MD  [phone]  [facility]

## 2022-01-18 NOTE — Plan of Care (Signed)
Problem: Promotion of Health and Safety  Goal: Promotion of Health and Safety  Description: The patient remains safe, receives appropriate treatment and achieves optimal outcomes (physically, psychosocially, and spiritually) within the limitations of the disease process by discharge.    Information below is the current care plan.  Outcome: Progressing  Flowsheets  Taken 01/18/2022 1516 by Lincoln Brigham, RN  Guidelines: Inpatient Nursing Guidelines  Taken 01/18/2022 0800 by Lincoln Brigham, RN  Patient /Family stated Goal: Take a walk later  Taken 01/18/2022 0138 by Raquel Sarna, RN  Outcome Evaluation (rationale for progressing/not progressing) every shift: Pt alert oriented x 4, denie pain, L nephrostomy draining clear yellowish color utrine output. Continue on IV Ampicillin infusing and tol. well no a/e noted. VSS afebrile. Continue to monitor.  Taken 01/16/2022 1511 by Emeterio Reeve, RN  Individualized Interventions/Recommendations #1: Fall precautions in place. Frequent rounding, bed in lowest position, wheels locked, non slip foot wear, personal and call bell within reach. Encourage calling for assistance and SBA when ambulating in hallway.  Individualized Interventions/Recommendations #2 (if applicable): Maintain nephrostomy tube and monitor output.  Individualized Interventions/Recommendations #3 (if applicable): Assess and manage lower back pain, administer pharmacological and non pharmacological interventions.  Individualized Interventions/Recommendations #5 (if applicable): Cluster care to minimize disruption and promote healing environment.  Note:

## 2022-01-18 NOTE — Plan of Care (Signed)
Problem: Promotion of Health and Safety  Goal: Promotion of Health and Safety  Description: The patient remains safe, receives appropriate treatment and achieves optimal outcomes (physically, psychosocially, and spiritually) within the limitations of the disease process by discharge.    Information below is the current care plan.  Outcome: Progressing  Flowsheets  Taken 01/18/2022 0138 by Raquel Sarna, RN  Outcome Evaluation (rationale for progressing/not progressing) every shift: Pt alert oriented x 4, denie pain, L nephrostomy draining clear yellowish color utrine output. Continue on IV Ampicillin infusing and tol. well no a/e noted. VSS afebrile. Continue to monitor.  Taken 01/17/2022 1746 by Lincoln Brigham, RN  Guidelines: Inpatient Nursing Guidelines  Taken 01/17/2022 0800 by Lincoln Brigham, RN  Patient /Family stated Goal: get well  Taken 01/16/2022 1511 by Emeterio Reeve, RN  Individualized Interventions/Recommendations #1: Fall precautions in place. Frequent rounding, bed in lowest position, wheels locked, non slip foot wear, personal and call bell within reach. Encourage calling for assistance and SBA when ambulating in hallway.  Individualized Interventions/Recommendations #2 (if applicable): Maintain nephrostomy tube and monitor output.  Individualized Interventions/Recommendations #3 (if applicable): Assess and manage lower back pain, administer pharmacological and non pharmacological interventions.  Individualized Interventions/Recommendations #4 (if applicable): Maintain IV access to administer IV abx.  Individualized Interventions/Recommendations #5 (if applicable): Cluster care to minimize disruption and promote healing environment.  Note:

## 2022-01-18 NOTE — Procedures (Signed)
Stephen [name] is a 71 year old male patient.    ICD-10-CM ICD-9-CM   1. Pyelonephritis  N12 590.80   2. Sepsis, due to unspecified organism, unspecified whether acute organ dysfunction present (CMS-HCC)  A41.9 038.9     995.91   3. AKI (acute kidney injury) (CMS-HCC)  N17.9 584.9   4. Decreased functional mobility  R26.89 781.99   5. Decreased activities of daily living (ADL)  Z78.9 V49.89   6. Ureter malignant neoplasm, left (CMS-HCC)  C66.2 189.2     Past Medical History:   Diagnosis Date   . Chronic back pain    . Congenital hydronephrosis    . Gout    . Headache    . Hematuria    . HTN (hypertension) 12/10/2021   . Kidney disease    . Kidney stones    . Major depressive disorder, single episode    . Polyarthropathy or polyarthritis of multiple sites    . Retinal detachment    . Urethral stricture      Blood pressure (!) 97/56, pulse 55, temperature 97.8 F (36.6 C), resp. rate 18, height 5\' 6"  (1.676 m), weight 88.2 kg (194 lb 7.1 oz), SpO2 96 %.    PICC Placement      Date/Time: 01/18/2022 12:10 PM      Universal Protocol  Consent: Verbal consent obtained. Written consent obtained.  Risks and benefits: risks, benefits and alternatives were discussed  Consent given by: patient  Patient understanding: patient states understanding of the procedure being performed  Patient consent: the patient's understanding of the procedure matches consent given  Procedure consent: procedure consent matches procedure scheduled  Relevant documents: relevant documents present and verified  Test results: test results available and properly labeled  Site marked: the operative site was marked  Imaging studies: imaging studies available  Patient identity confirmed: verbally with patient and arm band  Time out: Immediately prior to procedure a "time out" was called to verify the correct patient, procedure, equipment, support staff and site/side marked as required.      Time out performed with: Burnis Medin, RN    Indications  and Staff  Indications: prolonged antibiotic therapy  Insertion location/unit: [facility]  Reason for insertion: new indication  Performed by: Audelia Acton, RN  Attending present: No  Additional staff members:  Burnis Medin, RN           Procedure Details  Anesthesia: local infiltration  Local Anesthetic: lidocaine 1% without epinephrine  Anesthetic total: 2 mL  Patient was not sedated  Skin prep used?: chlorhexidine gluconate  Skin prep dry before first puncture: Yes      Patient position: supine  Insertion side: right  Insertion site: basilic    Catheter type: PICC  Tunneled: No   Catheter lumen: single lumen    Is this line for Dialysis: No  Brand (adult): Bard    Lot #: [id number]    Power rated for IV contrast: Yes  Antimicrobial catheter used: No  Ultrasound guidance: yes  Sterile ultrasound technique: sterile gel and sterile probe covers were used.Indications for ultrasound: safety  Ultrasound guided placement steps: vessel located, needle entry and guide wire removed  Sterile precautions used: sterile gown, cap, mask/eye shield, sterile gloves, head to toe drape on patient and hand hygiene  Catheter securement: subcutaneous securement    Post Procedure  Post-procedure: dressing applied  Estimated Blood Loss: 2 mL    Complications: none  Follow up: portable chest xray ordered and X-ray done, line in place, OK to use  Number of puncture attempts: 1  Line placement successful: Yes    Catheter tip location: SVC    Final length internal catheter: 39  Final length external catheter (cm): 3  Patient tolerance: patient tolerated the procedure well with no immediate complications      Comments: 53/2 R Basilic     All guidewires and stylet were intact, all sharps were accounted for.                          Audelia Acton, RN  01/18/2022

## 2022-01-18 NOTE — Interdisciplinary (Signed)
2151: Paged on call MD for an order to "Okay use of PICC line"     Moazzam, Jonell Cluck, MD at 2200 placed an order to use PICC.

## 2022-01-19 LAB — LIVER PANEL, BLOOD
ALT (SGPT): 25 U/L (ref 0–41)
AST (SGOT): 16 U/L (ref 0–40)
Albumin: 2.9 g/dL — ABNORMAL LOW (ref 3.5–5.2)
Alkaline Phos: 129 U/L (ref 40–129)
Bilirubin, Dir: <0.2 mg/dL (ref <0.2)
Bilirubin, Tot: 0.17 mg/dL (ref <1.2)
Total Protein: 5.4 g/dL — ABNORMAL LOW (ref 6.0–8.0)

## 2022-01-19 LAB — CBC WITH DIFF, BLOOD
ANC-Automated: 3.4 10*3/uL (ref 1.6–7.0)
Abs Basophils: 0 10*3/uL (ref <0.2)
Abs Eosinophils: 0.2 10*3/uL (ref 0.0–0.5)
Abs Lymphs: 2.1 10*3/uL (ref 0.8–3.1)
Abs Monos: 0.5 10*3/uL (ref 0.2–0.8)
Basophils: 1 %
Eosinophils: 4 %
Hct: 28.3 % — ABNORMAL LOW (ref 40.0–50.0)
Hgb: 8.9 gm/dL — ABNORMAL LOW (ref 13.7–17.5)
Imm Gran %: 2 % (ref <1)
Imm Gran Abs: 0.1 10*3/uL — ABNORMAL HIGH (ref <0.1)
Lymphocytes: 33 %
MCH: 28.5 pg (ref 26.0–32.0)
MCHC: 31.4 g/dL — ABNORMAL LOW (ref 32.0–36.0)
MCV: 90.7 um3 (ref 79.0–95.0)
MPV: 10 fL (ref 9.4–12.4)
Monocytes: 7 %
Plt Count: 333 10*3/uL (ref 140–370)
RBC: 3.12 10*6/uL — ABNORMAL LOW (ref 4.60–6.10)
RDW: 15.1 % — ABNORMAL HIGH (ref 12.0–14.0)
Segs: 54 %
WBC: 6.4 10*3/uL (ref 4.0–10.0)

## 2022-01-19 LAB — BASIC METABOLIC PANEL, BLOOD
Anion Gap: 11 mmol/L (ref 7–15)
BUN: 12 mg/dL (ref 8–23)
Bicarbonate: 26 mmol/L (ref 22–29)
Calcium: 8.3 mg/dL — ABNORMAL LOW (ref 8.5–10.6)
Chloride: 105 mmol/L (ref 98–107)
Creatinine: 1.26 mg/dL — ABNORMAL HIGH (ref 0.67–1.17)
Glucose: 109 mg/dL — ABNORMAL HIGH (ref 70–99)
Potassium: 3.6 mmol/L (ref 3.5–5.1)
Sodium: 142 mmol/L (ref 136–145)
eGFR Based on CKD-EPI 2021 Equation: >60 mL/min

## 2022-01-19 LAB — IBC - IRON BINDING CAPACITY
Iron Saturation: 18 %
Iron: 35 ug/dL — ABNORMAL LOW (ref 59–158)
Total IBC: 196 ug/dL (ref 148–506)
UIBC: 161 ug/dL (ref 112–346)

## 2022-01-19 LAB — RBC FOLATE, BLOOD
Hct: 28.3
RBC Folate: >1240 ng/mL — ABNORMAL HIGH (ref 209–640)

## 2022-01-19 LAB — LDH, BLOOD: LDH: 172 U/L (ref 25–175)

## 2022-01-19 LAB — FERRITIN, BLOOD: Ferritin: 134 ng/mL (ref 30–400)

## 2022-01-19 LAB — TSH, BLOOD: TSH: 1.32 u[IU]/mL (ref 0.27–4.20)

## 2022-01-19 LAB — VITAMIN B12, BLOOD: Vitamin B12: 426 pg/mL (ref 232–1245)

## 2022-01-19 MED ORDER — PNEUMOCOCCAL VAC POLYVALENT 25 MCG/0.5ML IJ INJ (CUSTOM)
0.5000 mL | INJECTION | INTRAMUSCULAR | Status: DC
Start: 2022-01-19 — End: 2022-01-19

## 2022-01-19 MED ORDER — TRAZODONE HCL 50 MG OR TABS
25.0000 mg | ORAL_TABLET | Freq: Every evening | ORAL | 0 refills | Status: DC
Start: 2022-01-19 — End: 2023-03-24

## 2022-01-19 MED ORDER — SODIUM CHLORIDE 0.9 % IV SOLN
8.0000 mg/kg | INTRAVENOUS | 0 refills | Status: AC
Start: 2022-01-20 — End: 2022-01-28

## 2022-01-19 MED ORDER — HEPARIN SODIUM (PORCINE) 5000 UNIT/ML IJ SOLN
5000.0000 [IU] | Freq: Three times a day (TID) | INTRAMUSCULAR | 0 refills | Status: DC
Start: 2022-01-19 — End: 2022-08-18

## 2022-01-19 MED ORDER — THERA M PLUS OR TABS
1.0000 | ORAL_TABLET | Freq: Every day | ORAL | 1 refills | Status: AC
Start: 2022-01-20 — End: ?

## 2022-01-19 MED ORDER — ALUM & MAG HYDROXIDE-SIMETH 200-200-20 MG/5ML OR SUSP
30.0000 mL | Freq: Four times a day (QID) | ORAL | 0 refills | Status: DC | PRN
Start: 2022-01-19 — End: 2022-09-15

## 2022-01-19 MED ORDER — BISACODYL 10 MG RE SUPP
10.0000 mg | Freq: Every day | RECTAL | Status: DC | PRN
Start: 2022-01-19 — End: 2022-01-19

## 2022-01-19 MED ORDER — BISACODYL 10 MG RE SUPP
10.0000 mg | Freq: Once | RECTAL | Status: AC
Start: 2022-01-19 — End: 2022-01-19
  Administered 2022-01-19 (×2): 10 mg via RECTAL
  Filled 2022-01-19: qty 1

## 2022-01-19 MED ORDER — BISACODYL 10 MG RE SUPP
10.0000 mg | Freq: Every day | RECTAL | 0 refills | Status: DC | PRN
Start: 2022-01-19 — End: 2022-09-15

## 2022-01-19 MED ORDER — MELATONIN 5 MG OR TABS
5.0000 mg | ORAL_TABLET | Freq: Every evening | ORAL | 0 refills | Status: DC
Start: 2022-01-19 — End: 2022-12-01

## 2022-01-19 MED ORDER — ALLOPURINOL 100 MG OR TABS
100.0000 mg | ORAL_TABLET | Freq: Every day | ORAL | 0 refills | Status: AC
Start: 2022-01-19 — End: ?

## 2022-01-19 MED ORDER — INFLUENZA VAC 65+ YRS WRAPPED RECORD (~~LOC~~)
1.0000 | PREFILLED_SYRINGE | INTRAMUSCULAR | Status: AC
Start: 2022-01-19 — End: 2022-01-19

## 2022-01-19 MED ORDER — HEPARIN SODIUM LOCK FLUSH 100 UNIT/ML IJ SOLN CUSTOM
100.0000 [IU] | Freq: Four times a day (QID) | INTRAVENOUS | 0 refills | Status: DC | PRN
Start: 2022-01-19 — End: 2022-09-15

## 2022-01-19 MED ORDER — SODIUM CHLORIDE 0.9 % IV SOLN
8.0000 mg/kg | INTRAVENOUS | Status: DC
Start: 2022-01-19 — End: 2022-01-19
  Administered 2022-01-19 (×2): 600 mg via INTRAVENOUS
  Filled 2022-01-19: qty 600

## 2022-01-19 MED ORDER — SODIUM CHLORIDE 0.9 % IJ SOLN (CUSTOM)
10.0000 mL | Freq: Three times a day (TID) | INTRAMUSCULAR | 0 refills | Status: DC
Start: 2022-01-19 — End: 2022-09-15

## 2022-01-19 NOTE — Interdisciplinary (Signed)
Discussed AVS discharge instructions with the patient. The patient was educated regarding medications, follow ups, diet, activity, [place]   The patient verbalized understanding and was able to state signs and symptoms of infection and when to seek immediate medical attention.  PIV was removed with catheter intact, occlusive dressing was applied.  Pt verified that they gathered all of their personal belongings and will be leaving with them. Pt [facility]

## 2022-01-19 NOTE — Discharge Instructions (Addendum)
Diagnosis and Reason for Admission    You were admitted to the hospital for the following reason(s):   # Sepsis secondary to CAUTI and bacteremia  # Acute metabolic encephalopathy, resolved  - BC on 1/26 and 1/29 with NGTD  - Urine culture with Enterococcus faecalis  - Patient had reported allergy to PCN but challenged with ampicillin and tolerating well  - Was on IV ampicillin until 2/01,, also on ceftriaxone 1/31.   - Starting IV Daptomycin 71m/kg as recommended by ID until 01/27/22   - Weekly labs starting Friday CBC, CMP, CRP, ESR, CPK while on IV BAX, remove PICC after IV ABX regimen is done.  - Negative TEE for Endocarditis  - PICC line placement 1/31   - DC to SNF today.     # Obstructive Uropathy with AKI   # Left hydronephrosis secondary to malignant ureteral obstruction   - Status post left PCN placed on 1/24 by IR  - Indwelling foley removed on 1/26  - Improved AKI with resolution of obstruction  - Urology consult noted  - Outpatient Urology follow up     # Urothelial cancer  - Follow up with urology on 2/7 for treatment planning as outpatient  Your full diagnosis list is located on this After Visit Summary in the Hospital Problems section.    [facility]  IV Antibiotics provided, need to complete 14 days of treatment until January 27 2022.  Left Nephrostomy tube placed .  Need to follow up with Urology in 2 weeks,  and ID in 1-2 weeks  besides PCP as outpatient.  Weekly labs while on IV antibiotics including CBC, CMP, CPK, ESR, CRP.  DC PICC line after antibiotic course is complete.    The main tests and treatments done for you during this hospitalization were:      Na 142 (02/01) CL 105 (02/01) BUN 12 (02/01) GLU   109* (02/01)   K 3.6 (02/01) CO2 26 (02/01) Cr 1.26* (02/01)       WBC 6.4 (02/01) HGB 8.9* (02/01) PLT 333 (02/01)    HCT 28.3* (02/01)       X-Ray Chest Frontal And Lateral     Result Date: 01/10/2022  IMPRESSION: No acute findings. No pneumonia.     X-Ray Chest  Single View     Result Date: 01/18/2022  IMPRESSION: Right arm PICC followed to the mid superior vena cava, in good position. New linear right greater left basal opacities are suggestive of subsegmental atelectasis. However, early sequela of aspiration or developing multifocal pneumonia possible in the appropriate clinical context. Recommend continued attention on follow-up radiographs.     IR Nephrostomy Tube     Result Date: 01/11/2022  IMPRESSION:   Technically successful image-guided left percutaneous nephrostomy tube placement.      CT Head W/O Contrast     Result Date: 01/12/2022  IMPRESSION: 1. No acute intracranial abnormality.     CT Chest W/O Contrast     Result Date: 01/12/2022  IMPRESSION: Marked improvement of multifocal pneumonia with some residual clustered nodular opacities which likely reflect airways centered infection rather than hematogenous spread. No new or worsened areas. No specific imaging features to suggest septic emboli. 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      CT Abdomen And Pelvis W/O Contrast     Result Date: 01/11/2022  IMPRESSION:   CT scan of the abdomen and pelvis without IV contrast. moderate left hydronephrosis, unchanged compared  to 04/22/2021 CT urography. Note that assessment of solid organs is limited in the absence of intravenous contrast.      US Kidney Complete     Result Date: 01/10/2022  IMPRESSION: Moderate left hydronephrosis, mildly increased compared to prior. Partially visualized left nephroureteral stent in the left renal pelvis. Status post right nephrectomy.     The following evaluation is still important to complete after discharge from the hospital:  Weekly labs  x2 as noted above starting Friday 01/21/22, CBC, CMP, CPK, ESR, CRP.  Remove PICC line after antibiotic therapy is done.  Urology follow up in 2 weeks.  ID follow up in the clinic in 1-2 weeks.    Daily PT and OT treatment    Instructions for After Discharge    Your diet at home should  be a regular diet.    Your activity level at home should be:  as much exercise or activity as you can tolerate.    Specific activity restrictions:    None    Wound or tube care instructions:  Keep area clean and dry.  Change Nephrostomy tube dressing every 2 days, dry dressing.    Your medication list is located on this After Visit Summary in the Current Discharge Medication List section.  Your nurse will review this information with you before you leave the hospital.    It is very important for you to keep a current medication list with you in order to assist your doctors with your medical care.  Bring this After Visit Summary with you to your follow up appointments.         Reasons to Contact a Doctor Urgently    Call 911 or return to the hospital immediately if:  Shortness of breath, chest pain, Fever, Low blood pressure, Any neurological changes.    You should contact either your primary care physician or your hospital physician for any of the following reasons:  Any new symptoms.    Your hospital physician at the time of hospital discharge is  Tawny Asal MD .    Your physicians strive to explain things in a way you can understand. If you have any questions about your hospital care, your medications, or if you have new or concerning symptoms soon after going home from the hospital, and you need to contact your hospital physician, they can be contacted in the following manner:     During business hours (Monday-Friday 8 a.m.-5 p.m.), please contact the hospital medicine office at [phone].  Outside of business hours, please contact the [facility] operator at [phone] who can page the on-call physician.       New problems and symptoms unrelated to your hospitalization are best addressed by your primary outpatient physician (PCP), as are requests for medication refills or appointment referrals after hospital discharge.  However, if new issues arise before you can see or contact your PCP, your  hospital physician may be able to assist with these issues during this time.    What Needs to Happen Next After Discharge -- Appointments and Follow Up    Any appointments already scheduled at [name] clinics will be listed in the Future Appointments section at the top of this After Visit Summary.      Sometimes tests performed in the hospital do not yet have results by the time a patient goes home.  The following key tests will need to be followed up at your next appointment: None    Medical  Home Information    Your primary care provider or clinic currently on file at [facility] is: Areta Haber, [place] Amber currently have an advance directive or living will on file at [facility]: Yes    Handouts Given to You (if applicable)

## 2022-01-19 NOTE — Plan of Care (Signed)
Problem: Promotion of Health and Safety  Goal: Promotion of Health and Safety  Description: The patient remains safe, receives appropriate treatment and achieves optimal outcomes (physically, psychosocially, and spiritually) within the limitations of the disease process by discharge.    Information below is the current care plan.  Outcome: Progressing  Flowsheets  Taken 01/19/2022 0152  Individualized Interventions/Recommendations #1: Patient's care clustered to promote rest and healing  Individualized Interventions/Recommendations #2 (if applicable): Monitored output from left PCN Tube.  Outcome Evaluation (rationale for progressing/not progressing) every shift: Patient calm and cooperative with care, tolerating IV ABX, PCN draining cloudy yellow urine, Pain managed with prn Oxycodone. In bed and sleeping at this time, VSS will contiue to monitor.  Taken 01/18/2022 2012  Patient /Family stated Goal: Get some rest and sleep  Note:

## 2022-01-19 NOTE — Discharge Summary (Addendum)
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 Post-traumatic stricture of anterior urethra   . Bladder neck contracture   . Lumbar radiculopathy   . Cervical radiculopathy   . Incomplete bladder emptying   . Lumbosacral spondylosis without myelopathy   . Chronic SI joint pain   . Degenerative spondylolisthesis   . Lumbar stenosis with neurogenic claudication   . Pre-procedure lab exam   . Cervical spondylosis without myelopathy   . Cervical spondylosis   . S/P lumbar fusion   . Chronic left SI joint pain   . Chronic midline thoracic back pain   . Spondylolisthesis at L5-S1 level   . Left renal mass   . Malignant neoplasm of urinary bladder, unspecified site (CMS-HCC)   . Dorsopathy   . Ureter malignant neoplasm, left (CMS-HCC)   . Cauda equina compression (CMS-HCC)   . COVID-19 virus infection   . Community acquired pneumonia   . Acute urinary retention   . HTN (hypertension)   . Gout   . MDD (major depressive disorder)   . Urothelial cancer (CMS-HCC)   . Urinary tract infection associated with indwelling urethral catheter (CMS-HCC)   . Ureteral obstruction   . Hydronephrosis         Additional Hospital Diagnoses ("rule out" or "suspected" diagnoses, etc.):  # Primary HTN  # Gout  # Normocytic chronic Anemia  # Chronic back pain    Principal Procedure During This Hospitalization (required):  IR Nephrostomy Tube    Result Date: 01/11/2022  IMPRESSION:   Technically successful image-guided left percutaneous nephrostomy tube placement.    PICC line placement 01/18/2022      Other Procedures Performed During This  Hospitalization (required):  Na 142 (02/01) CL 105 (02/01) BUN 12 (02/01) GLU   109* (02/01)   K 3.6 (02/01) CO2 26 (02/01) Cr 1.26* (02/01)      WBC 6.4 (02/01) HGB 8.9* (02/01) PLT 333 (02/01)    HCT 28.3* (02/01)      X-Ray Chest Frontal And Lateral    Result Date: 01/10/2022  IMPRESSION: No acute findings. No pneumonia.    X-Ray Chest Single View    Result Date: 01/18/2022  IMPRESSION: Right arm PICC followed to the mid superior vena cava, in good position. New linear right greater left basal opacities are suggestive of subsegmental atelectasis. However, early sequela of aspiration or developing multifocal pneumonia possible in the appropriate clinical context. Recommend continued attention on follow-up radiographs.    IR Nephrostomy Tube    Result Date: 01/11/2022  IMPRESSION:   Technically successful image-guided left percutaneous nephrostomy tube placement.     CT Head W/O Contrast    Result Date: 01/12/2022  IMPRESSION: 1. No acute intracranial abnormality.    CT Chest W/O Contrast    Result Date: 01/12/2022  IMPRESSION: Marked improvement of multifocal pneumonia with some residual clustered nodular opacities which likely reflect airways centered infection rather than hematogenous spread. No new or worsened areas. No specific imaging features to suggest septic emboli. 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  CT Abdomen And Pelvis W/O Contrast    Result Date: 01/11/2022  IMPRESSION:   CT scan of the abdomen and pelvis without IV contrast. moderate left hydronephrosis, unchanged compared to 04/22/2021 CT urography. Note that assessment of solid organs is limited in the absence of intravenous contrast.     US Kidney Complete    Result Date: 01/10/2022  IMPRESSION: Moderate left hydronephrosis, mildly increased compared to prior. Partially visualized left nephroureteral stent in the left renal pelvis. Status post right nephrectomy        Consultations Obtained During This  Hospitalization:  Cardiology  Case Management  Infectious Diseases  Interventional Radiology  Nutrition Services  Occupational Therapy  Physical Therapy  Urology    Key consultant recommendations:  ID   Continue IV Antibiotics until Jan 27, 2022, on Daptomycin 698m daily.  Weekly labs while on IV ABX CBC, CMP, CPK, ESR, CRP    Urology  PCN placement  Outpatient follow up    Reason for Admission to the Hospital / History of Present Illness:  Chief Complaint  shaking    History of Present Illness  Patient is a 71y.o. male with PMH HTN, depression, arthritis, urothelial [place] s/p chemo and surgery, solitary L kidney and chronic indwelling foley presenting with cloudy urine and shakes.    Patient reports that over the last week he has developed pain around his foley insertion site and urine has turned more brown/cloudy or "rust" colored. Also has been endorsing left flank/back pain and LLQ abdominal pan. Foley has been draining appropriately without any sign of obstruction. Took his temp today and was noted to be 99.5 so decided to come to the ed.    On arrival here patient was HDS but febrile to 101.2.  Labs notable for creatinine of 2.62 (baseline of 1.0), WBC 15.4. blood culture sent and UA pending, started on empiric CTX given UTI hx and concern for pyelonephritis. No CT obtained given elevated creatinine.    Hospital Course by Problem (required):  DDraylon [name] a89year oldmale with a pmhx HTN, depression, urothelial cancer s/p chemotherapy and surgery, solitary L kidney w chronic indwelling foley catheter who p/w rigors, found to have sepsis secondary to CAUTI leading to E. Faecalis bacteremia as well as AKI secondary to obstructive uropathy s/p left PCN placement.    #Sepsis secondary to CAUTI and bacteremia  # Acute metabolic encephalopathy, resolved  - BC on 1/26 and 1/29 with NGTD  - Urine culture with Enterococcus faecalis  - Patient had reported allergy to PCN but challenged with ampicillin and  tolerating well  - Was on IV ampicillin until 2/01,, also on ceftriaxone 1/31.   - Starting IV Daptomycin 869mkg 600 mg daily, as recommended by ID until 01/27/22   - Weekly labs starting Friday CBC, CMP, CRP, ESR, CPK while on IV BAX, remove PICC after IV ABX regimen is done.  - Negative TEE for Endocarditis  - PICC line placement 1/31  - DC to SNF today.    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    # Urothelial cancer  - Follow up with urology on 2/7 for treatment planning as outpatient    #Elevated LFTs-  Improving  - May be attributed to abx,   - Monitor LFTs, stable and trending down    # Primary HTN  - Bp is stable, holding  Amlodipine for now.    # Gout  -Continue allopurinol, 100 mg PO daily    # Normocytic chronic Anemia  - MVI with mineral supplements  - Check Iron, B12, Folate, ferritin in am      # Chronic back pain  -Continue duloxetine and lyrica    # Concern for schizophrenia  - Patient refused psych consultation; has not exhibited any overt signs of schizophreniaduring the stay    Tests Outstanding at Discharge Requiring Follow Up:  There are no tests or other items that require follow up after hospital discharge.      Discharge Condition (required):  Improved.    Key Physical Exam Findings at Discharge:  Mental Status Exam: Patient is alert and oriented to person, place, time, and situation.  No significant physical examination findings at the time of discharge.    Discharge Diet:  Regular.    Discharge Medications:     What To Do With Your Medications      START taking these medications      Add'l Info   aluminum-magnesium-simethicone 200-200-20 MG/5ML suspension  Commonly known as: MAG-AL PLUS  Take 30 mL by mouth  every 6 hours as needed for Heartburn.   Quantity: 240 mL  Refills: 0     bisacodyl 10 MG suppository  Commonly known as: DULCOLAX  Insert 1 suppository (10 mg) rectally daily as needed for Constipation.   Quantity: 10 suppository  Refills: 0     DAPTOmycin (CUBICIN) 588.8 mg in sodium chloride 100 mL IVPB  Inject 588.8 mg into vein every 24 hours for 8 doses Indications: Gary BLOODSTREAM INFECTION.  Start taking on: January 20, 2022   Quantity: 600 mg  Refills: 0     heparin 100 UNIT/ML injection  1 mL (100 Units) by IntraCATHETER route every 6 hours as needed (PICC care).   Quantity: 30 each  Refills: 0     heparin 5000 UNIT/ML injection  Inject 1 mL (5,000 Units) under the skin every 8 hours.   Quantity: 90 each  Refills: 0     melatonin 5 MG tablet  Take 1 tablet (5 mg) by mouth at bedtime.   Quantity: 30 tablet  Refills: 0     multivitamin with minerals Tabs tablet  Take 1 tablet by mouth daily.  Start taking on: January 20, 2022   Quantity: 30 tablet  Refills: 1     sodium chloride 0.9 % flush  10 mL by IntraCATHETER route every 8 hours.   Quantity: 90 each  Refills: 0     traZODone 50 MG tablet  Commonly known as: DESYREL  Take 0.5 tablets (25 mg) by mouth at bedtime.   Quantity: 30 tablet  Refills: 0        CHANGE how you take these medications      Add'l Info   allopurinol 100 MG tablet  Commonly known as: ZYLOPRIM  Take 1 tablet (100 mg) by mouth daily.   Quantity: 30 tablet  Refills: 0  What changed:    medication strength   how much to take     docusate sodium 250 MG capsule  Commonly known as: COLACE  Take 1 capsule (250 mg) by mouth 2 times daily.   Quantity: 60 capsule  Refills: 0  What changed: Another medication with the same  name was removed. Continue taking this medication, and follow the directions you see here.        CONTINUE taking these medications      Add'l Info   acetaminophen 325 MG tablet  Commonly known as: TYLENOL  Take 2 tablets (650 mg) by mouth every 6 hours as needed for Mild Pain  (Pain Score 1-3) (alternate with motrin).   Quantity: 30 tablet  Refills: 0     DULoxetine 20 MG CR capsule  Commonly known as: CYMBALTA  Take 1 capsule (20 mg) by mouth daily.   Quantity: 90 capsule  Refills: 3     lidocaine 4 % patch  Commonly known as: ASPERCREME  Apply 1 patch topically every 24 hours. Leave patch on for 12 hours, then remove for 12 hours.   Quantity: 30 patch  Refills: 0     naloxone 4 mg/0.1 mL nasal spray  Commonly known as: NARCAN  For suspected opioid overdose, call 911! Then spray once in one nostril. Repeat after 3 minutes if no or minimal response using a new spray in other nostril.   Quantity: 2 each  Refills: 0     ondansetron 8 MG disintegrating tablet  Commonly known as: ZOFRAN ODT  Take 1 tablet (8 mg) on or under the tongue every 8 hours as needed for Nausea/Vomiting.   Quantity: 10 tablet  Refills: 0     oxyCODONE 5 MG immediate release tablet  Commonly known as: ROXICODONE  Take 1 tablet (5 mg) by mouth 2 times daily as needed for Moderate Pain (Pain Score 4-6) or Severe Pain (Pain Score 7-10).   Quantity: 30 tablet  Refills: 0     polyethylene glycol 17 GM/SCOOP powder  Commonly known as: GLYCOLAX  Take 17 g by mouth daily. Mix with 4 to 8 ounces of fluid (water, juice, soda, coffee, or tea) prior to administration.   Quantity: 255 g  Refills: 0     pregabalin 100 MG capsule  Commonly known as: LYRICA  TAKE 1 CAPSULE BY MOUTH EVERYDAY AT BEDTIME   Quantity: 30 capsule  Refills: 0     Proventil HFA 108 (90 Base) MCG/ACT inhaler  Inhale 2 puffs by mouth every 4 hours as needed for Wheezing.  Generic drug: albuterol   Quantity: 6.7 g  Refills: 0     senna 8.6 MG tablet  Commonly known as: SENOKOT  Take 1 tablet (8.6 mg) by mouth daily.   Quantity: 30 tablet  Refills: 0     tamsulosin 0.4 MG capsule  Commonly known as: FLOMAX  Take 1 capsule (0.4 mg) by mouth daily.   Quantity: 30 capsule  Refills: 3     tizanidine 2 MG tablet  Commonly known as: ZANAFLEX  Take 1 tablet (2 mg) by  mouth every 8 hours as needed (spasm).   Quantity: 40 tablet  Refills: 0     VITAMIN D PO  Take 125 mg by mouth daily.   Refills: 0        STOP taking these medications    [place]away 0.025 % ophthalmic solution  Generic drug: ketotifen     amLODIPINE 5 MG tablet  Commonly known as: NORVASC     ascorbic acid 500 MG/5ML suspension  Commonly known as: VITAMIN C     benzonatate 100 MG capsule  Commonly known as: TESSALON     GNP Urinary Pain Relief Max St 99.5 MG Tabs  Generic drug: Phenazopyridine HCl     guaiFENesin-dextromethorphan 100-10  MG/5ML syrup  Commonly known as: ROBITUSSIN DM     menthol 3 MG lozenge  Commonly known as: CEPACOL     SYSTANE ULTRA OP     triamcinolone 0.1 % cream  Commonly known as: KENALOG           Where to Get Your Medications      Please check with staff for printed prescription or if prescription was faxed to your pharmacy.    Bring a paper prescription for each of these medications   allopurinol 100 MG tablet   aluminum-magnesium-simethicone 200-200-20 MG/5ML suspension   bisacodyl 10 MG suppository   DAPTOmycin (CUBICIN) 588.8 mg in sodium chloride 100 mL IVPB   heparin 100 UNIT/ML injection   heparin 5000 UNIT/ML injection   melatonin 5 MG tablet   multivitamin with minerals Tabs tablet   sodium chloride 0.9 % flush   traZODone 50 MG tablet         Allergies:  Allergies   Allergen Reactions   . Amoxicillin Rash     Not allergic. Patient tolerating ampicillin,    . Sulfa Drugs Unspecified       Discharge Disposition:  Skilled nursing facility.        Discharge Code Status:  Full code / full care  This code status is not changed from the time of admission.    Follow Up Appointments:    Already Scheduled:  Future Appointments   Date Time Provider [place]   01/25/2022  1:00 PM Carlota Raspberry, MD [facility]   04/15/2022  9:00 AM Arlana Lindau, MD [facility]       PostDischarge Referrals and Orders     Consult/Referral to Primary Care      Consult/Referral  to Urology      Interventional Radiology - Consult/Referral      Outpatient Parenteral ATX Therapy (OPAT)          Certain appointment types may not appear in this section. Refer to the Post Discharge Referrals section of the After Visit Summary for further information.    Discharging 15 Contact Information:  [facility] Medicine phone triage at [phone].    I spent >30 min coordinating this patients care for discharge which includes:    -Discharge Medication Reconciliation    -Counseling patient on new medications and treatment plan    -Coordinating follow up appointments    Tawny Asal MD  [phone]  [place]

## 2022-01-19 NOTE — Interdisciplinary (Signed)
01/19/22 1217   Discharge Plan   Is the Patient Medically Stable for Discharge * Yes   Living Arrangements on Admission* Family Member   Post Acute Services Referred To SNF/Subacute facilities   SNF/Subacute facilities status Accepted   Patient Engaged in Discharge Planning * Yes   Family/Caregiver's Assessed for * Not Applicable   Respite Care * Not Applicable   Patient/Family/Other Are In Agreement With Discharge Plan * Yes   PATIENT CHOICE: Patient/Family/Legal/Surrogate Decision Maker Has Been Given a List Options And Choice In The Selection of Post-Acute Care Providers Yes   CM discussed the following with pt, and/or family, and/or DPOA [place] has agreements with select post-acute care providers in the collaborative care network;If patient has chosen [facility] or [facility] for post-acute care, they were informed that we partner with, and have a financial interest in these organizations   Patient's Top 3 Discharge Facility Choices  any SNF near here   [place] * Not Applicable   Discharge Planning Needs   Primary Family/Caregiver Contact Name, Number and Relationship * Pt sister, [name]: [phone]   Final Discharge Transportation   Transportation *  Ambulance   Date 01/19/22   Time 1400   Discharge Transportation Details *  [place] Phone: [phone]   Team notified of transport  Yes   Final Discharge Destination/Services   Final Discharge Destination/Services * [place]   Facility/SNF Transfer   Receiving Physician/Contact Name * Dr. Duke Salvia   [facility] * [facility] - [address], [place], [place] [address] [phone]   Room and Bed Number 302A   Name and Address of Facility Transferring To [place] [facility] Phone: [phone]   Facility Phone/Fax * # FOR REPORT [phone]       Pt to dc to [facility]  for IV abx until 01/28/22, pt agreeable with dcp.  Pt states he will return to sisters home upon dc from [place].    Alberteen Sam, RN  Care Manager

## 2022-01-19 NOTE — Progress Notes (Addendum)
Pharmacist Discharge Medication Reconciliation    After Visit Summary (AVS) medication list was reviewed by pharmacist.     Discharge medication reconciliation performed by pharmacist.   Discussed with primary team the following:    . Amlodipine: confirmed discontinuation d/t low BP's.    Of note, this does not include a review on prescription coverage.  If further assistance with coverage is needed, please call the pharmacy where the discharge medications were sent.    Rolanda Jay, PharmD

## 2022-01-19 NOTE — Progress Notes (Addendum)
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     Subjective / Interval Events   Doing well overall   ABX changed to Dapto daily dose until 2/09  Weekly labs starting Friday CBC, CMP, CRP, ESR, CPK while on IV BAX, remove PICC after IV ABX regimen is done.  PICC line placed for IV ABX until 01/27/2022  Planning for SNF placement likely today    Objective   Temperature:  [97.2 F (36.2 C)-97.9 F (36.6 C)] 97.9 F (36.6 C) (02/01 0801)  Blood pressure (BP): (99-118)/(54-60) 108/60 (02/01 0913)  Heart Rate:  [50-63] 63 (02/01 0913)  Respirations:  [16-19] 16 (02/01 0801)  Pain Score: Patient Sleeping, Respiratory Assessment Done (02/01 0557)  SpO2:  [95 %-99 %] 99 % (02/01 0801)  01/31 0600 - 02/01 0559  In: 2458 [P.O.:1020; I.V.:700]  Out: 1700 [Urine:1700]      Physical Exam  Const: Sitting at bedside, not in pain or acute distress  Head: Normo Cephalic and atraumatic, neck is supple, no JVD  Eyes: Conjunctivae and lids normal  CV: Regular rhythm and normal rate, no murmur/gallop, no leg edema, warm extremities  Resp: Normal effort, Clear to ausculation bilaterally, no wheezes/rales/rhonchi  GI: Abdomen is soft, nondistended, non tender, normoactive bowel sounds  GU: Voids, Lft PCN to gravity bag  MSK: No clubbing or cyanosis, no deformities  Skin: No rashes, lesions or ulcers  Neuro: A/O x3, no facial asymmetry, speech is clear, 5/5 strength in all four extremities    Pertinent Labs & Imaging     Na 142 (02/01) CL 105 (02/01) BUN 12 (02/01) GLU   109* (02/01)   K 3.6 (02/01) CO2 26 (02/01) Cr 1.26* (02/01)      WBC 6.4 (02/01) HGB 8.9* (02/01) PLT 333 (02/01)    HCT 28.3* (02/01)      X-Ray Chest Frontal And Lateral    Result Date:  01/10/2022  IMPRESSION: No acute findings. No pneumonia.    X-Ray Chest Single View    Result Date: 01/18/2022  IMPRESSION: Right arm PICC followed to the mid superior vena cava, in good position. New linear right greater left basal opacities are suggestive of subsegmental atelectasis. However, early sequela of aspiration or developing multifocal pneumonia possible in the appropriate clinical context. Recommend continued attention on follow-up radiographs.    IR Nephrostomy Tube    Result Date: 01/11/2022  IMPRESSION:   Technically successful image-guided left percutaneous nephrostomy tube placement.     CT Head W/O Contrast    Result Date: 01/12/2022  IMPRESSION: 1. No acute intracranial abnormality.    CT Chest W/O Contrast    Result Date: 01/12/2022  IMPRESSION: Marked improvement of multifocal pneumonia with some residual clustered nodular opacities which likely reflect airways centered infection rather than hematogenous spread. No new or worsened areas. No specific imaging features to suggest septic emboli. 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     CT Abdomen And Pelvis W/O Contrast    Result Date: 01/11/2022  IMPRESSION:   CT scan of the abdomen and pelvis without IV contrast. moderate left hydronephrosis, unchanged compared to 04/22/2021 CT urography. Note that assessment of solid organs is limited in the absence of intravenous contrast.  US Kidney Complete    Result Date: 01/10/2022  IMPRESSION: Moderate left hydronephrosis, mildly increased compared to prior. Partially visualized left nephroureteral stent in the left renal pelvis. Status post right nephrectomy.        Current Medications     Scheduled Medications  . allopurinol  300 mg Daily   . amLODIPINE  10 mg Daily   . DAPTOmycin (CUBICIN) IVPB  8 mg/kg (Adjusted) Q24H NR   . docusate sodium  250 mg BID   . DULoxetine  20 mg Daily   . heparin  5,000 Units Q8H   . melatonin  5 mg HS   . multivitamin with minerals  1 tablet Daily   .  pregabalin  100 mg Nightly   . sodium chloride  10 mL Q8H   . sodium chloride  3 mL Q8H   . tamsulosin  0.4 mg Daily   . traZODone  25 mg HS     Continuous Medications  . sodium chloride       PRN Medications  . acetaminophen  650 mg Q6H PRN   . albuterol  2.5 mg Q20 Min PRN   . aluminum-magnesium-simethicone  30 mL Q6H PRN   . bisacodyl  10 mg Daily PRN   . heparin  100 Units Once PRN   . heparin  100 Units Once PRN   . LORazepam  0.5 mg Q6H PRN   . oxyCODONE  5 mg Q12H PRN   . polyethylene glycol  17 g Daily PRN   . sodium chloride  10 mL PRN   . sodium chloride  3 mL PRN   . sodium chloride   Continuous PRN   . tizanidine  2 mg Q8H PRN       Hospital Problem List   Active Hospital Problems    Diagnosis POA   . *Sepsis (CMS-HCC) [A41.9] Yes   . Urinary tract infection associated with indwelling urethral catheter (CMS-HCC) [T83.511A, N39.0] Yes   . AKI (acute kidney injury) (CMS-HCC) [N17.9] Yes   . Ureteral obstruction [N13.5] Yes   . Hydronephrosis [N13.30] Yes   . Pyelonephritis [N12] Yes   . Malignant neoplasm of urinary bladder, unspecified site (CMS-HCC) [C67.9] Yes      Resolved Hospital Problems   No resolved problems to display.       Assessment / Plan   Stephen [name] is a 71 year old male with a pmhx HTN, depression, urothelial cancer s/p chemotherapy and surgery, solitary L kidney w chronic indwelling foley catheter who p/w rigors, found to have sepsis secondary to CAUTI leading to E. Faecalis bacteremia as well as AKI secondary to obstructive uropathy s/p left PCN placement.    # Sepsis secondary to CAUTI and bacteremia  # Acute metabolic encephalopathy, resolved  - BC on 1/26 and 1/29 with NGTD  - Urine culture with Enterococcus faecalis  - Patient had reported allergy to PCN but challenged with ampicillin and tolerating well  - Was on IV ampicillin until 2/01,, also on ceftriaxone 1/31.   - Starting IV Daptomycin 7m/kg as recommended by ID until 01/27/22   - Weekly labs starting Friday CBC, CMP,  CRP, ESR, CPK while on IV BAX, remove PICC after IV ABX regimen is done.  - Negative TEE for Endocarditis  - PICC line placement 1/31  - DC to SNF today.    # Obstructive Uropathy with AKI   # Left hydronephrosis secondary to malignant ureteral obstruction   - Status post left  PCN placed on 1/24 by IR  - Indwelling foley removed on 1/26  - Improved AKI with resolution of obstruction  - Urology consult noted  - Outpatient Urology follow up    # Urothelial cancer  - Follow up with urology on 2/7 for treatment planning as outpatient    # Elevated LFTs- Improving  - May be attributed to abx,   - Monitor LFTs, stable and trending down    # Primary HTN  - Bp is stable, holding  Amlodipine for now.    # Gout  -Continue allopurinol    # Normocytic chronic Anemia  - MVI with mineral supplements  - Check Iron, B12, Folate, ferritin in am      # Chronic back pain  -Continue duloxetine and lyrica    # Concern for schizophrenia  - Patient refused psych consultation; has not exhibited any overt signs of schizophrenia during the stay    [facility] Care: [facility]   Disposition Planning 01/19/2022:  Discharge barriers: none Discharge location: Skilled nursing facility  Outpatient Referrals Needed: PCP, ID, Urology  Estimated Discharge: today     Foley: PCN on the left  VTE prophylaxis: SQ heparin heparin-100 Units,heparin-100 Units,heparin-5,000 Units  Code status: Full Code Last BM: 01/15/22 0300   IV fluids: No IV fluids  Access: PIVs  Diet: Diet Regular     I spent 55 minutes of time on the patient care unit reviewing the patient's chart, examining the patient, communicating with the patient's care providers and/or family members and reviewing diagnostic studies. DC planning.    Tawny Asal MD  [phone]  [facility]

## 2022-01-20 ENCOUNTER — Telehealth (HOSPITAL_BASED_OUTPATIENT_CLINIC_OR_DEPARTMENT_OTHER): Payer: Self-pay | Admitting: Infectious Disease

## 2022-01-20 ENCOUNTER — Telehealth (HOSPITAL_BASED_OUTPATIENT_CLINIC_OR_DEPARTMENT_OTHER): Payer: Self-pay

## 2022-01-20 DIAGNOSIS — B999 Unspecified infectious disease: Secondary | ICD-10-CM

## 2022-01-20 NOTE — Telephone Encounter (Addendum)
[place] / Medical Oncology Social Work Telephone note:    Current status:  Incoming calls X 2 from [place].  He is known to this LCSW from prior clinic visits/phone calls.  Today, we spoke for 10 minutes.      Stephen [name] shared that he was hospitalized at [facility] for an infection from 01/10/22 to 01/19/22.  He was d/c'd yesterday to a SNF, [facility].  Probably for 7-8 days.  He expects to make his appt on 2/7 with Dr. Sallyanne Kuster at 1 pm.  We will do our best to meet for supportive counseling, as per Stephen [name]'s request.  He is thankful for the conversation today with this LCSW.    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  He is comfortable and feels well cared for.  He says they have lots of services available, and he is taking advantage of these to improve his heath.  He expresses great relief staying at the [facility], not only for his health, but for a respite from his family dynamic.  He lives in the home of his sister, her husband, and their adult child.  He reports there is tension, and he only trusts his sister.      Summary/Plan:  Stephen [name] expresses sincere appreciation for the assistance of inpatient case manager, Alberteen Sam, RN.  Message sent to inpatient CM on pt behalf to thank her for the placement.    Stephen [name] presents as friendly and thoughts seem logical, clear, and directed.    Stephen [name] and I tentatively plan to meet after his clinic visit on 2/7.    Will follow as needed.    [name], [place] Social Worker  [phone]

## 2022-01-20 NOTE — Telephone Encounter (Addendum)
OUTPATIENT PARENTERAL ANTIBIOTIC THERAPY (OPAT) ENROLLMENT NOTE      Enrollment Date: 01/19/22  Name: Stephen [name]   DOB: 27-May-1951   Age: 71 year old  MRN: [id number]     OPAT Provider:    Janne Napoleon, MD   First Appointment:    N/A   Type of Line Access:   PICC Single Lumen   [Date Inserted 01/18/22 ]   PICC Site:    Location:  R  basilic    PICC Line Length    Internal length (cm):     39       External length (cm):    3        Planned Abx End Date:     01/27/22   OPAT Diagnosis:    Bloodstream Infection      OPAT IV ANTIBIOTICS     1.   DAPTOmycin (CUBICIN) 588.8 MG IV every 24 hours    PRE/POST APPOINTMENT LABS,IMAGING AND OTHER TESTS      Labs:   CBC w/diff - CMP - ESR - CRP - CPK   Imaging:  Per last ID note, no imaging or tests requested prior to ID appt.      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     #High gradeE faecalis bacteremia  # urosepsis  #Complicated UTI/ Pyelonephritis  S/p PCN on 1/24 for obstructive uropathy in the setting of sepsis. E faecalis grew from urine and blood. TTE and TEE negative yesterday; therefore, recommend treating E faecalis bacteremia/pyelo for 2 weeks. Favor starting from initiation of ampicllin as he was on ceftriaxone alone at beginning of hospitalization, which does not provide adequate coverage. Can narrow to ampicllin alone now that endocarditis has been ruled out.  - Can d/c ceftriaxone  - Cont ampicillin alone for 2 week course from initiation of ampicillin (1/27 - 2/9)              - Ok for PICC- PICC line can be removed after last doe of antibiotic              - OPAT recs below               - Weekly labs and ID f/u at end of abx course    --  OPAT Recommendations:     1. Drug: Ampicillin continuous infusion preferred if possible by [facility] otherwise 2g q4hrs  2. Duration: 2 weeks (1/27 - 2/9)  3. Monitoring Labs: Please obtain weekly CMP, CBC w/diff, and fax results to [phone] ATTN: OPAT MD  4. Please enroll the patient in the OPAT (Outpatient Parenteral Antibiotic Therapy) program through the Epic inpatient OPAT order set (enter "OPAT" in order sets)    It is essential for monitoring efficacy and adverse effects of therapy that the above labs are ordered. Please ensure that the above orders are placed prior to discharge.   --    #  Solitary L kidney, upper tract urothelial cancer  #AKI - Improved    Recommendations discussed with primary team. We will sign off at this time. Please, do not hesitate to reach out if you have any additional questions.     Patient discussed with ID attending, Dr. Jake Michaelis, who agrees with the above assessment and plan.     Louis Matte, MD  Fellow, Division of Infectious Diseases  [facility]    ID Attending note    I have seen and examined the patient. I reviewed and edited the fellow's note, Dr. Malcolm Metro and agree with the documented findings and plan of care, which reflects our discussion.      Was informed by the primary team that SNF is not accepting patient with ampicillin due to frequent dosing, ( would not accept continuous infusion as well), recommended daptomycin 24m/kg IV daily with checking CPK at baseline then weekly while on daptomycin.  Would avoid vancomycin if possible due to recent AKI in the setting of single kidney.  Primary team to contact case manager regarding ampicillin as well as daptomycin.  Please see fellow's note for more details.      MJanne Napoleon MD MPH  Infectious Diseases Attending         This patient's antibiotics and weekly labs will be managed by the OPAT team.      The patient will be seen in clinic on  N/A-  no follow up needed        The above information has been confirmed with the patient's Infusion Providers.

## 2022-01-21 DIAGNOSIS — R339 Retention of urine, unspecified: Secondary | ICD-10-CM

## 2022-01-21 DIAGNOSIS — C689 Malignant neoplasm of urinary organ, unspecified: Secondary | ICD-10-CM

## 2022-01-21 DIAGNOSIS — N179 Acute kidney failure, unspecified: Secondary | ICD-10-CM

## 2022-01-21 DIAGNOSIS — N12 Tubulo-interstitial nephritis, not specified as acute or chronic: Secondary | ICD-10-CM

## 2022-01-21 LAB — BLOOD CULTURE
Blood Culture Result: NO GROWTH
Blood Culture Result: NO GROWTH

## 2022-01-22 ENCOUNTER — Other Ambulatory Visit (HOSPITAL_BASED_OUTPATIENT_CLINIC_OR_DEPARTMENT_OTHER): Payer: Self-pay | Admitting: Urology

## 2022-01-22 DIAGNOSIS — R339 Retention of urine, unspecified: Secondary | ICD-10-CM

## 2022-01-24 MED ORDER — TAMSULOSIN HCL 0.4 MG PO CAPS
ORAL_CAPSULE | ORAL | 3 refills | Status: DC
Start: 2022-01-24 — End: 2022-06-24

## 2022-01-24 NOTE — Patient Instructions (Incomplete)
You have been referred to the Department of Interventional Radiology for a clinic consult. This should be completed 10 days from today (02/04/22).  Please call to schedule an appointment at your earliest convenience.  Insurance varies from person to person and you may also need an insurance authorization prior to being seen.     Interventional Radiology Department scheduling phone number: [phone].      Cystoscopy, Care After  Refer to this sheet in the next few weeks. These instructions provide you with information about caring for yourself after your procedure. Your health care provider may also give you more specific instructions. Your treatment has been planned according to current medical practices, but problems sometimes occur. Call your health care provider if you have any problems or questions after your procedure.  What can I expect after the procedure?  After the procedure, it is common to have:  Mild pain when you urinate. Pain should stop within a few minutes after you urinate. This may last for up to 1 week.  A small amount of blood in your urine for several days.  Feeling like you need to urinate but producing only a small amount of urine.    Follow these instructions at home:    Medicines  Take over-the-counter and prescription medicines only as told by your health care provider.  If you were prescribed an antibiotic medicine, take it as told by your health care provider. Do not stop taking the antibiotic even if you start to feel better.  General instructions    Return to your normal activities as told by your health care provider. Ask your health care provider what activities are safe for you.  Do not drive for 24 hours if you received a sedative.  Watch for any blood in your urine. If the amount of blood in your urine increases, call your health care provider.  Follow instructions from your health care provider about eating or drinking restrictions.  If a tissue sample was removed for testing  (biopsy) during your procedure, it is your responsibility to get your test results. Ask your health care provider or the department performing the test when your results will be ready.  Drink enough fluid to keep your urine clear or pale yellow.  Keep all follow-up visits as told by your health care provider. This is important.  Contact a health care provider if:  You have pain that gets worse or does not get better with medicine, especially pain when you urinate.  You have difficulty urinating.  Get help right away if:  You have more blood in your urine.  You have blood clots in your urine.  You have abdominal pain.  You have a fever or chills.  You are unable to urinate.  This information is not intended to replace advice given to you by your health care provider. Make sure you discuss any questions you have with your health care provider.  Document Released: 06/24/2005 Document Revised: 05/12/2016 Document Reviewed: 10/22/2015  Elsevier Interactive Patient Education  [employer].

## 2022-01-25 ENCOUNTER — Ambulatory Visit: Payer: Medicare Other | Attending: Urology | Admitting: Urology

## 2022-01-25 ENCOUNTER — Encounter (HOSPITAL_BASED_OUTPATIENT_CLINIC_OR_DEPARTMENT_OTHER): Payer: Self-pay

## 2022-01-25 ENCOUNTER — Encounter (HOSPITAL_BASED_OUTPATIENT_CLINIC_OR_DEPARTMENT_OTHER): Payer: Self-pay | Admitting: Urology

## 2022-01-25 VITALS — BP 113/64 | HR 66 | Temp 97.4°F | Resp 16 | Ht 66.0 in | Wt 181.0 lb

## 2022-01-25 DIAGNOSIS — C679 Malignant neoplasm of bladder, unspecified: Secondary | ICD-10-CM | POA: Insufficient documentation

## 2022-01-25 DIAGNOSIS — R5381 Other malaise: Secondary | ICD-10-CM

## 2022-01-25 DIAGNOSIS — N200 Calculus of kidney: Secondary | ICD-10-CM | POA: Insufficient documentation

## 2022-01-25 DIAGNOSIS — N179 Acute kidney failure, unspecified: Secondary | ICD-10-CM

## 2022-01-25 DIAGNOSIS — N390 Urinary tract infection, site not specified: Secondary | ICD-10-CM

## 2022-01-25 DIAGNOSIS — T83511S Infection and inflammatory reaction due to indwelling urethral catheter, sequela: Secondary | ICD-10-CM

## 2022-01-25 DIAGNOSIS — R339 Retention of urine, unspecified: Secondary | ICD-10-CM | POA: Insufficient documentation

## 2022-01-25 LAB — FUNGAL CULTURE: Fungus Culture Result: NO GROWTH

## 2022-01-25 MED ORDER — LIDOCAINE HCL 2% EX GEL (UROJET)
Status: AC
Start: 2022-01-25 — End: 2022-01-25
  Filled 2022-01-25: qty 10

## 2022-01-25 MED ORDER — LIDOCAINE HCL 2% EX GEL (UROJET)
10.0000 mL | Freq: Once | Status: AC
Start: 2022-01-25 — End: 2022-01-25
  Administered 2022-01-25 (×2): 10 mL via URETHRAL

## 2022-01-25 NOTE — Addendum Note (Signed)
Addended by: Marca Ancona on: 01/25/2022 01:04 PM     Modules accepted: Orders

## 2022-01-25 NOTE — Progress Notes (Unsigned)
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  CT urography on 04/22/2021 confirmed a filling defect in the anterior left renal pelvis.  Chest CT has been ordered but not performed yet.  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  He tolerated the procedure well.      He received 6 instillations of gelatinous mitomycin between August 05, 2021 and September 09, 2021.    MR urogram from October 26, 2021 with mild thickening in the left renal pelvis otherwise unremarkable.    Baseline creatinine has remained pre stable at 1.32    Notably, Ta[facility] bladder cancer on 08-19-2021; repeat on 08-26-2021 no cancer.    He received ureteroscopy and transurethral resection of bladder tumor on December 24, 2021 which was notable for small volume low-grade appearing tumors in the renal pelvis as well as a tumor in the mid ureter.  Final pathology from the bladder was notable for high-grade urothelial dysplasia.    He was readmitted for urinary tract infection and hydronephrosis despite a stent the nephrostomy tube was placed on January 11, 2022.    Review of Systems: No TIA's or unusual headaches, no dysphagia.  No prolonged cough. No dyspnea or chest pain on exertion.  No abdominal pain, change in bowel habits, black or bloody stools.  No urinary tract or BPH symptoms.  No new or unusual musculoskeletal symptoms.    Physical Exam:  There were no vitals filed for this visit.   Patient in no acute distress  Alert and Oriented  Normocephalic, atraumatic  Abdomen soft, non tender  No lower extremity edema    PROCEDURE: A time out was conducted  immediately prior to the procedure in agreement with the patient and team, confirming the following: patient name, MRN, date of birth, consent, procedure, site, side, marking, and correct equipment/implant, if applicable.    The patient was given the risks and benefits of cystoscopy.  He was given antibiotics. He was prepped and draped in standard surgical fashion. Flexible cystoscopy was performed with a 16-French flexible cystoscope. His anterior urethra appeared normal. His prostatic urethra was surgically resected.  The stent was grasped and removed intact.  Prior surgical site appeared to have inflammatory tissue. There is no evidence of erythema, patchy areas, or papillary lesions. Complete inspection, including retroflexion, was performed, and barbotage cytology was obtained.        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    -Mr. Stephen [name] has had a difficult postoperative course including a UTI.  At this point I would like to obtain an antegrade nephrostogram in approximately 2 weeks and then plan for 6 additional cycles of Jelmyto installation.    -bladder cancer: we reviewed the natural history of non-muscle-invasive bladder cancer including features regarding recurrence and progression.  We briefly reviewed risk stratification based on EORTC risk table parameters and indication for intravesical therapy based on these data.  Given his tumor characteristics, we will  initiate intravesical therapy at this time.  He will follow up in 3 months for surveillance cystoscopy

## 2022-01-25 NOTE — Interdisciplinary (Unsigned)
1255 Patient placed in room tx #1 for Cystoscopy with stent removal. Procedure explained to patient and agreed.  1308 prep completed using sterile technique, 2% lidocaine inserted into the urethra pt tolerated well  1347 MD arrived, time out called  1348 START  1400 END      Patient discharged in stable condition.      Time out of room 1420

## 2022-01-25 NOTE — Progress Notes (Signed)
[place] / Medical Oncology Social Work Note    Interview Data:  Met with Stephen [name] in Urology clinic, as per his request for ongoing supportive intervention.  Stephen [name] and I spoke in the procedure exam room for 15 minutes as he waited for Dr. Sallyanne Kuster to perform cystoscopy.    Stephen [name] openly shared his struggles with the family and getting along with is BIL, however, his sister remains supportive.  Stephen [name] firmly believes he cannot afford his own apt in [place] ([place]), and it appeared he has not followed up on prior [place] referrals for housing.  I encouraged Stephen [name] to maintain his home with his sister, as it seeeds to be the most affordable option.      Stephen [name] had several questions/concerns about his current medical status and a couple "spots" that he wonders are cancer, "or maybe they got it all".  I encouraged him several times to outline his questions and talk to Dr. Sallyanne Kuster.  It seemed that Stephen [name] was wondering if his cancer had metastasized, but this was difficult to clarify.  Again, I encouragd him to talk to the doctor.      Action taken/Plan:  Consult with nursing, Nelly Rout, LVN, re: Stephen [name]'s concerns.  She will pass this on to Dr. Sallyanne Kuster.    Stephen [name] has my phone number and contacts me as needed.    Will follow PRN.    [name], [place] Social Worker  [phone]      Nelly Rout [place], LVN   [place], [place]

## 2022-01-26 ENCOUNTER — Encounter (HOSPITAL_BASED_OUTPATIENT_CLINIC_OR_DEPARTMENT_OTHER): Payer: Self-pay | Admitting: Urology

## 2022-01-26 ENCOUNTER — Telehealth (HOSPITAL_BASED_OUTPATIENT_CLINIC_OR_DEPARTMENT_OTHER): Payer: Self-pay

## 2022-01-26 NOTE — Telephone Encounter (Signed)
Date: January 26, 2022   Patient Name: Stephen [name]   DOB: [birth year]  Age: 71 year old    Faxed order to [facility] to:    D/c PICC Line after last dose 01/27/22    [facility] [place]:  [phone]   Fax: [phone]

## 2022-01-27 LAB — URINE CULTURE

## 2022-01-28 MED ORDER — FLUCONAZOLE 150 MG OR TABS
150.0000 mg | ORAL_TABLET | Freq: Every day | ORAL | 0 refills | Status: AC
Start: 2022-01-28 — End: 2022-02-04

## 2022-01-28 NOTE — Addendum Note (Signed)
Addended by: Carlota Raspberry on: 01/28/2022 04:48 PM     Modules accepted: Orders

## 2022-02-02 NOTE — Telephone Encounter (Signed)
OUTPATIENT PARENTERAL ANTIBIOTIC THERAPY (OPAT) DISCHARGE NOTE     Delayed entry: OPAT Discharge Summary  Patient Name: Stephen [name]   DOB: 04-Mar-1951   Age: 71 year old    Patient completed IV antibiotic treatment with Cubicin     Per clinic orders 01/25/22           Confirmed PICC line removal with [facility] at [facility]     Patient discharged from OPAT effective 01/29/22

## 2022-02-03 ENCOUNTER — Other Ambulatory Visit (HOSPITAL_BASED_OUTPATIENT_CLINIC_OR_DEPARTMENT_OTHER): Payer: Self-pay | Admitting: Urology

## 2022-02-03 DIAGNOSIS — C679 Malignant neoplasm of bladder, unspecified: Secondary | ICD-10-CM

## 2022-02-03 DIAGNOSIS — N200 Calculus of kidney: Secondary | ICD-10-CM

## 2022-02-04 ENCOUNTER — Other Ambulatory Visit (HOSPITAL_BASED_OUTPATIENT_CLINIC_OR_DEPARTMENT_OTHER): Payer: Self-pay | Admitting: Surgery

## 2022-02-04 ENCOUNTER — Other Ambulatory Visit: Payer: Self-pay

## 2022-02-04 LAB — EMMI , ANESTHESIA (ADULT): EMMI Video Order Number: 14297727076

## 2022-02-04 LAB — EMMI,  PAIN MANAGEMENT: ACUTE IN HOSPITAL: EMMI Video Order Number: 19815062658

## 2022-02-04 LAB — EMMI , PATIENT SATISFACTION: HOSPITAL DISCHARGE EXPECTATIONS: EMMI Video Order Number: 17971308162

## 2022-02-08 ENCOUNTER — Encounter (HOSPITAL_BASED_OUTPATIENT_CLINIC_OR_DEPARTMENT_OTHER): Payer: Self-pay | Admitting: Vascular & Interventional Radiology

## 2022-02-08 ENCOUNTER — Telehealth (HOSPITAL_BASED_OUTPATIENT_CLINIC_OR_DEPARTMENT_OTHER): Payer: Self-pay | Admitting: Urology

## 2022-02-08 MED ORDER — IODIXANOL 320 MG/ML IV SOLN
INTRAVENOUS | Status: DC | PRN
Start: 2022-01-11 — End: 2022-02-08
  Administered 2022-01-11 (×2): 20 mL

## 2022-02-08 NOTE — Telephone Encounter (Signed)
Called pt, appt scheduled.

## 2022-02-08 NOTE — Telephone Encounter (Signed)
Caller: Pt   Relationship to patient: Self   Phone # Pt [phone]  Provider: Dr. Sallyanne Kuster  Notes:     Pt calling to schedule 1 month follow up for 3/7 per last visit Dr. Sallyanne Kuster. Next avail w/ MD is 4/21 that pt declined requesting for appt. on 3/7. Per pt starting more chemo and April is too far.  Please adivse.     Caller has been advised of 2 hr turnaround time.

## 2022-02-09 ENCOUNTER — Telehealth (HOSPITAL_BASED_OUTPATIENT_CLINIC_OR_DEPARTMENT_OTHER): Payer: Self-pay | Admitting: Urology

## 2022-02-09 NOTE — Telephone Encounter (Addendum)
Received paper fax from [facility] store number [phone] requesting refills for medication Tamsulosin. Patient has three additional refills. LVM informing patient to please call [facility] to request a refill. No further action required.

## 2022-02-11 ENCOUNTER — Encounter (HOSPITAL_BASED_OUTPATIENT_CLINIC_OR_DEPARTMENT_OTHER)
Admission: RE | Disposition: A | Discharge status: Home Health | Payer: Self-pay | Source: Ambulatory Visit | Attending: Surgery

## 2022-02-11 ENCOUNTER — Ambulatory Visit (HOSPITAL_BASED_OUTPATIENT_CLINIC_OR_DEPARTMENT_OTHER): Payer: Medicare Other

## 2022-02-11 ENCOUNTER — Encounter (HOSPITAL_BASED_OUTPATIENT_CLINIC_OR_DEPARTMENT_OTHER): Payer: Self-pay | Admitting: Surgery

## 2022-02-11 ENCOUNTER — Ambulatory Visit
Admission: RE | Admit: 2022-02-11 | Discharge: 2022-02-11 | Disposition: A | Discharge status: Home / Self Care | Payer: Medicare Other | Source: Ambulatory Visit | Attending: Surgery | Admitting: Surgery

## 2022-02-11 DIAGNOSIS — C679 Malignant neoplasm of bladder, unspecified: Secondary | ICD-10-CM

## 2022-02-11 DIAGNOSIS — N1330 Unspecified hydronephrosis: Secondary | ICD-10-CM | POA: Insufficient documentation

## 2022-02-11 DIAGNOSIS — N200 Calculus of kidney: Secondary | ICD-10-CM

## 2022-02-11 LAB — COVID-19 CORONAVIRUS VIRAL SEQUENCING (INVESTIGATIONAL ONLY)

## 2022-02-11 SURGERY — IR TUBE CHECK
Anesthesia: Moderate Sedation - by non-anesthesia staff only

## 2022-02-11 MED ORDER — FENTANYL CITRATE (PF) 100 MCG/2ML IJ SOLN
INTRAMUSCULAR | Status: DC | PRN
Start: 2022-02-11 — End: 2022-02-11
  Administered 2022-02-11 (×3): 50 ug via INTRAVENOUS

## 2022-02-11 MED ORDER — IODIXANOL 320 MG/ML IV SOLN
INTRAVENOUS | Status: DC | PRN
Start: 2022-02-11 — End: 2022-02-11
  Administered 2022-02-11 (×2): 25 mL

## 2022-02-11 MED ORDER — MIDAZOLAM HCL 2 MG/2ML IJ SOLN
INTRAMUSCULAR | Status: DC | PRN
Start: 2022-02-11 — End: 2022-02-11
  Administered 2022-02-11 (×3): 1 mg via INTRAVENOUS

## 2022-02-11 MED ORDER — LIDOCAINE HCL 1 % IJ SOLN
INTRAMUSCULAR | Status: DC | PRN
Start: 2022-02-11 — End: 2022-02-11
  Administered 2022-02-11 (×2): 5 mL via INTRADERMAL

## 2022-02-11 MED ORDER — FENTANYL CITRATE (PF) 100 MCG/2ML IJ SOLN
INTRAMUSCULAR | Status: AC
Start: 2022-02-11 — End: 2022-02-11
  Filled 2022-02-11: qty 2

## 2022-02-11 MED ORDER — LIDOCAINE HCL 1 % IJ SOLN
INTRAMUSCULAR | Status: AC
Start: 2022-02-11 — End: 2022-02-11
  Filled 2022-02-11: qty 10

## 2022-02-11 MED ORDER — IODIXANOL 320 MG/ML IV SOLN
INTRAVENOUS | Status: AC
Start: 2022-02-11 — End: 2022-02-11
  Filled 2022-02-11: qty 50

## 2022-02-11 MED ORDER — MIDAZOLAM HCL 2 MG/2ML IJ SOLN
INTRAMUSCULAR | Status: AC
Start: 2022-02-11 — End: 2022-02-11
  Filled 2022-02-11: qty 2

## 2022-02-11 SURGICAL SUPPLY — 11 items
APPLICATOR CHLORAPREP 26ML, ~~LOC~~ (Prep Solutions)
BAG DRAINAGE NEPHROSTOMY 600ML (Misc Medical Supply) ×2
CATHETER DRAIN MULTIPURPOSE MAC-LOC 8.5FR X 25CM (Lines/Drains) ×2 IMPLANT
COVER PROBE MICROTEK INTRAOPERATIVE 5" X 96" PC1308 (Drapes/towels) IMPLANT
DECANTER BAG-A-JET STERILE (Misc Medical Supply) ×2 IMPLANT
GUIDEWIRE AMPLATZ SUPER STIFF 0.035" X 75CM, 7CM TAPER, STRAIGHT (Procedural wires/sheaths/catheters/balloons/dilators) ×2
NEEDLE BLUNT FILL 18G X 1.5" (Needles/punch/cannula/biopsy) ×2 IMPLANT
PREP CHLOROPREP 10.5ML 1-STEP (Prep Solutions) IMPLANT
PROCEDURE PACK - IR NON-VASCULAR (Procedure Packs/kits) ×2 IMPLANT
SYRINGE HYPO LL 20CC (Needles/punch/cannula/biopsy) ×2 IMPLANT
TOWELS OR BLUE 4-PACK STERILE, DISPOSABLE (Drapes/towels) IMPLANT

## 2022-02-11 NOTE — H&P (Signed)
HISTORY & PHYSICAL - INTERVAL ASSESSMENT    **ONLY TO BE USED IN ADDITION TO A HISTORY & PHYSICAL**    Stephen [name]  [id number]      This interval assessment is required for History & Physical completed less than 30 days but more than 24 hours prior to the admission or surgery. A History & Physical completed more than 30 days prior to the admission or surgery must be repeated.  See H & P from Dr. Sallyanne Kuster on 01/25/2022.      Current Medical Status:  Unchanged    Medications / Allergies:  Unchanged    Review of Systems:  Unchanged    Physical Examination:  I have examined the patient today. Pertinent portions of the exam are unchanged    Laboratory or Clinical Data:  Unchanged    Modifications of Initial Care Plan:  Unchanged    PRE-PROCEDURE RECORD FOR SEDATION    Planned procedure:  Procedure(s):  ANTEGRADE NEPHROSTOGRAM    Planned type of sedation:   Moderate sedation.  I have discussed this procedure, its potential risks and complications, the risks associated with refusal of the procedure, and alternatives to the procedure with this patient.  Planned sedation route:  IV.      Sedation/Anesthesia History:    The patient has no history of an adverse reaction to sedation or anesthesia.    I have assessed the patient's status immediately prior to this procedure:  Yes  I have discussed pain management needs and options for the patient with the patient or caregiver:  Yes    Sedation options, risks, and plans have been discussed with the patient or caregiver.  Questions were answered.  The patient or caregiver agrees to proceed as planned.    Airway History and Assessment:      Airway Class:  Class II.  Neck range of motion  normal.    Potential airway issues?  None    ASA classification of physical status:  3.  A patient with severe systemic disease that limits activity but is not incapacitating.    ATTENDING RADIOLOGIST: Dr. Christa See    The patient has consented to the procedure.    Stephen [name]     02/11/22      3:33 PM

## 2022-02-11 NOTE — Brief Op Note (Signed)
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    POSTOPERATIVE DIAGNOSIS: Same as above    PROCEDURE: PCN check/change    ATTENDING PHYSICIAN: Elliette Seabolt    ANESTHESIA/MEDICATIONS:   Intra-op Administered Meds from 02/11/2022 1606 to 02/11/2022 1639     Date/Time Order Dose Route Action Action by Comments    02/11/2022 1621 PST fentaNYL injection 50 mcg IntraVENOUS Given Marko Stai, RN --    02/11/2022 1616 PST fentaNYL injection 50 mcg IntraVENOUS Given Marko Stai, RN --    02/11/2022 1633 PST iodixanol (VISIPAQUE) 320 MG/ML injection 25 mL IntraDRAIN Given Thomasena Edis, MD --    02/11/2022 1625 PST lidocaine 1% injection 5 mL IntraDERMAL Given Thomasena Edis, MD --    02/11/2022 1621 PST midazolam (VERSED) injection 1 mg IntraVENOUS Given Marko Stai, RN --    02/11/2022 1616 PST midazolam (VERSED) injection 1 mg IntraVENOUS Given Marko Stai, RN --          FINDINGS: Minimal egress of contrast into the bladder. Successful 8.5 Fr nephrostomy tube change.    COMPLICATIONS: None    DISPOSITION: Return to home in stable condition.

## 2022-02-11 NOTE — Discharge Instructions (Signed)
General : Nephrostogram     SEDATION     If you were given sedative or pain medications used during the procedure you are considered legally impaired for 24 hours. Medications used for sedation can remain in your system for up to 24 hours. Please do not drive, sign any legal documents or drink alcohol during this time.     You may experience temporary alterations in memory or amnesia. If this has not resolved after 24 hours please contact us.     PAIN MANAGEMENT     You may use over-the-counter acetaminophen (TYLENOL) for minor discomfort, if you are not otherwise restricted from using this medication or any other prescribed pain medication by the doctor that performed your procedure.     WHEN TO CALL YOUR PHYSICIAN      Fever (temperature > 100 F) and / or chills    Severe pain / chest pain    Bleeding, redness / drainage or new swelling around the incision site    Shortness of breath    Nausea or vomiting     Tubes/drains/lines:     If you have a drain or tube in place, no swimming, baths, or soaking in hot tubs.     MEDICATIONS     Please resume taking your regular medications, unless otherwise told by your doctor.     PHYSICIAN PERFORMING YOUR PROCEDURE: Dr. Christa See    ADDITIONAL INSTRUCTIONS:     CONTACT us:     During regular business hours, M-F 8:00 to 5:00, please call [phone] [facility]) or [phone] [facility] / [facility])     After Hours: Please call the [facility] Page Operator at [phone] and ask for the Interventional Radiologist On-Call.     Please call the  Page Operator at [phone] and ask for the Neurosurgery doctor On-Call (for Neuro patients)

## 2022-02-11 NOTE — Plan of Care (Signed)
Problem: Promotion of Perioperative Health and Safety  Goal: Promotion of Health and Safety of the Perioperative Patient  Description: The patient remains safe, receives treatment appropriate to the surgical intervention and patient's physiological needs and is discharged or transferred to the appropriate level of care.    Information below is the current care plan.  Outcome: Progressing  Flowsheets (Taken 02/11/2022 1545)  Patient /Family stated Goal: Have a successful kidney tube change  Guidelines: PACU  Individualized Interventions/Recommendations #1: Keep patient safe and comfortable throughout stay.

## 2022-02-14 ENCOUNTER — Other Ambulatory Visit (HOSPITAL_BASED_OUTPATIENT_CLINIC_OR_DEPARTMENT_OTHER): Payer: Self-pay | Admitting: Nurse Practitioner

## 2022-02-14 DIAGNOSIS — Z981 Arthrodesis status: Secondary | ICD-10-CM

## 2022-02-15 ENCOUNTER — Other Ambulatory Visit (HOSPITAL_BASED_OUTPATIENT_CLINIC_OR_DEPARTMENT_OTHER): Payer: Self-pay | Admitting: Medical

## 2022-02-15 DIAGNOSIS — M62838 Other muscle spasm: Secondary | ICD-10-CM

## 2022-02-15 MED ORDER — PREGABALIN 100 MG OR CAPS
ORAL_CAPSULE | ORAL | 0 refills | Status: DC
Start: 2022-02-15 — End: 2022-03-22

## 2022-02-15 NOTE — Telephone Encounter (Signed)
LOV- 11/23/2021    Plan:  Continue bracing, pain management and PT>    After a discussion we were able to answer all of his questions and he  voiced understanding of this plan and will follow up in 4 weeks.    On follow up he  will  need repeat radiographs.      Last refilled - 12/24/2021

## 2022-02-15 NOTE — Telephone Encounter (Signed)
DOS 11/08/21  Revision lumbar spine posterior, Lumbar 2-Sacrumine  Dr Zlomislic  LOV 27/12/92  NOV: Not scheduled.  Would you like for [name] to schedule?    LRD 11/16/21    Routing refill request to Provider.

## 2022-02-16 MED ORDER — TIZANIDINE HCL 2 MG OR TABS
2.0000 mg | ORAL_TABLET | Freq: Three times a day (TID) | ORAL | 0 refills | Status: DC | PRN
Start: 2022-02-16 — End: 2023-01-23

## 2022-02-19 ENCOUNTER — Encounter: Payer: Self-pay | Admitting: Family Medicine

## 2022-02-22 ENCOUNTER — Ambulatory Visit (INDEPENDENT_AMBULATORY_CARE_PROVIDER_SITE_OTHER): Payer: Medicare Other

## 2022-02-22 ENCOUNTER — Other Ambulatory Visit: Payer: Self-pay

## 2022-02-22 ENCOUNTER — Encounter (HOSPITAL_BASED_OUTPATIENT_CLINIC_OR_DEPARTMENT_OTHER): Payer: Self-pay | Admitting: Urology

## 2022-02-22 ENCOUNTER — Ambulatory Visit: Payer: Medicare Other | Attending: Internal Medicine | Admitting: Urology

## 2022-02-22 VITALS — BP 117/63 | HR 80 | Temp 98.3°F | Resp 16 | Ht 66.0 in | Wt 195.0 lb

## 2022-02-22 DIAGNOSIS — I714 Abdominal aortic aneurysm, without rupture, unspecified: Secondary | ICD-10-CM

## 2022-02-22 DIAGNOSIS — I7141 Pararenal abdominal aortic aneurysm, without rupture: Secondary | ICD-10-CM | POA: Diagnosis not present

## 2022-02-22 DIAGNOSIS — C642 Malignant neoplasm of left kidney, except renal pelvis: Secondary | ICD-10-CM | POA: Insufficient documentation

## 2022-02-22 DIAGNOSIS — C678 Malignant neoplasm of overlapping sites of bladder: Secondary | ICD-10-CM | POA: Insufficient documentation

## 2022-02-22 DIAGNOSIS — C662 Malignant neoplasm of left ureter: Secondary | ICD-10-CM | POA: Insufficient documentation

## 2022-02-22 MED ORDER — METHYLPREDNISOLONE 4 MG OR KIT
ORAL_TABLET | ORAL | 0 refills | Status: DC
Start: 2022-02-22 — End: 2022-09-15

## 2022-02-22 MED ORDER — SILDENAFIL CITRATE 20 MG PO TABS: 40.0000 mg | ORAL_TABLET | Freq: Every day | ORAL | 1 refills | Status: DC | PRN

## 2022-02-22 NOTE — Patient Instructions (Signed)
PLAN:    Schedule a follow up visit in 5 weeks with Dr. Sallyanne Kuster and schedule nurse visit to start BCG next week.(Please stop by the front desk)   Call to schedule imaging and complete in 4 weeks prior to next visit You have been referred to the Department of Interventional Radiology for a clinic consult. Please call to schedule an appointment at your earliest convenience.  Insurance varies from person to person and you may also need an insurance authorization prior to being seen.     Interventional Radiology Department scheduling phone number: [phone].     Proceed to your retail pharmacy to pick up your medication    For more information on Urology, please visit:   [email or web address]    [email or web address]      Carlota Raspberry MD  Department of Urology     [facility] - Urology  Monday-Friday 8:00- 5:00p.m. (Closed Holidays and Weekends)     For any question or to schedule any appointment call [phone]    AFTER HOURS EMERGENCY NUMBER [phone]  Ask for the Urologist/Oncologist Physician On-Call.        [facility] - Urology  Fax number: [phone]     Clinical Nurse  Vicente Males  Phone: [phone]  Fax: [phone]    Administrative Assistant/[facility]   Phone: [phone]  Fax: [phone]    Social Worker  Agnes Lawrence, LCSW  Phone: [phone]

## 2022-02-22 NOTE — Progress Notes (Signed)
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  CT urography on 04/22/2021 confirmed a filling defect in the anterior left renal pelvis.  Chest CT has been ordered but not performed yet.  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  He tolerated the procedure well.      He received 6 instillations of gelatinous mitomycin between August 05, 2021 and September 09, 2021.    MR urogram from October 26, 2021 with mild thickening in the left renal pelvis otherwise unremarkable.    Baseline creatinine has remained pre stable at 1.32    Notably, TaHG bladder cancer on 08-19-2021; repeat on 08-26-2021 no cancer.    He received ureteroscopy and transurethral resection of bladder tumor on December 24, 2021 which was notable for small volume low-grade appearing tumors in the renal pelvis as well as a tumor in the mid ureter.  Final pathology from the bladder was notable for high-grade urothelial dysplasia.    He was readmitted for urinary tract infection and hydronephrosis despite a stent the nephrostomy tube was placed on January 11, 2022.  Stent removal has been performed and nephrostogram on 02/11/2022 shows minimal drainage of contrast into the bladder.      Review of Systems: No TIA's or unusual headaches, no dysphagia.  No prolonged cough. No dyspnea or chest pain on exertion.  No abdominal pain, change in bowel habits, black or bloody stools.  No urinary tract or BPH symptoms.  No new or unusual musculoskeletal symptoms.    Physical Exam:  There were no vitals filed for this visit.   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    -installation of additional mitomycin does not seem like a safe option as it stands given the fact that he has decreased drainage from the nephrostomy tube.  I will initiate a course of steroids and repeat a nephrostogram in 2 weeks.    -bladder cancer: we reviewed the natural history of non-muscle-invasive bladder cancer including features regarding recurrence and progression.  We briefly reviewed risk stratification based on EORTC risk table parameters and indication for intravesical therapy based on these data.  Given his tumor characteristics, we will initiate intravesical therapy at this time.  He will follow up in 3 months for surveillance cystoscopy

## 2022-02-23 ENCOUNTER — Telehealth (HOSPITAL_BASED_OUTPATIENT_CLINIC_OR_DEPARTMENT_OTHER): Payer: Self-pay | Admitting: Urology

## 2022-02-23 NOTE — Telephone Encounter (Signed)
Pt calling to schedule BCG appt. Pt requesting a call back. Please assist  Thank you    [phone]

## 2022-02-23 NOTE — Telephone Encounter (Signed)
Called pt today to schedule antegrade nephrostogram in 4 weeks no answer left message

## 2022-02-23 NOTE — Telephone Encounter (Signed)
Staff message routed to MD/NP for review.

## 2022-02-25 ENCOUNTER — Other Ambulatory Visit: Payer: Self-pay

## 2022-02-25 NOTE — Telephone Encounter (Signed)
Edrick Kins, NP  Georga Kaufmann, Madelin Rear, MD, PhD  Reviewed and approved for BCG: High-grade urothelial dysplasia

## 2022-02-28 ENCOUNTER — Telehealth (HOSPITAL_BASED_OUTPATIENT_CLINIC_OR_DEPARTMENT_OTHER): Payer: Self-pay | Admitting: Urology

## 2022-02-28 NOTE — Telephone Encounter (Signed)
Received faxed PA request for sildenafil 20 mg tab.  Submitted PA, key:  [id number], Rx:  [id number].  Decision pending.  ?

## 2022-02-28 NOTE — Telephone Encounter (Signed)
Called pt to schedule BCG treatments.    States he would like to speak to Vicente Males first prior to scheduling. Is confused regarding AVS instructions as well as medication that he needs to take 1 week prior to starting BCG.     Requesting a call back.

## 2022-02-28 NOTE — Telephone Encounter (Signed)
Pt left me a voicemail msg - states there's a problem with BCG orders Dr. Sallyanne Kuster entered since he is not receiving any calls. Request call back at [phone].

## 2022-03-01 NOTE — Telephone Encounter (Signed)
Pt is asking to Speak With Stephen [name] regarding Orders For procedures and has Question regarding RX.    Please call:  [phone]

## 2022-03-01 NOTE — Telephone Encounter (Addendum)
Spoke with patient - advised I am unsure of the plan and will need to ask a nurse to review with him. Patient is concerned about when to take Medrol since he was advised to take it 6 days prior to BCG, but Dr. Sallyanne Kuster wants to star BCG next week. Pt also not sure when he should schedule follow up and imaging, states he is confused with instructions provided to him.    Advised I will ask nurse to review and contact him with treatment plan once confirmed.    Routing to [employer] to review treatment plan with pt.

## 2022-03-01 NOTE — Telephone Encounter (Signed)
1) please reach out to patient and schedule the BCG appointment  - based off of this date, patient is to take medrol pills the 6 days leading up to appointment  2) patient is scheduled with IR 4/10 per patient - please confirm this with him since it isn't showing in appointment desk  3) schedule an office visit ~1 week after radiology so about 4/17

## 2022-03-03 ENCOUNTER — Encounter (HOSPITAL_BASED_OUTPATIENT_CLINIC_OR_DEPARTMENT_OTHER): Payer: Self-pay | Admitting: Physician Assistant

## 2022-03-03 ENCOUNTER — Ambulatory Visit: Payer: Medicare Other | Attending: Physician Assistant | Admitting: Physician Assistant

## 2022-03-03 DIAGNOSIS — M549 Dorsalgia, unspecified: Secondary | ICD-10-CM | POA: Insufficient documentation

## 2022-03-03 DIAGNOSIS — M7918 Myalgia, other site: Secondary | ICD-10-CM

## 2022-03-03 DIAGNOSIS — M47817 Spondylosis without myelopathy or radiculopathy, lumbosacral region: Secondary | ICD-10-CM | POA: Insufficient documentation

## 2022-03-03 DIAGNOSIS — Z981 Arthrodesis status: Secondary | ICD-10-CM | POA: Insufficient documentation

## 2022-03-03 DIAGNOSIS — C642 Malignant neoplasm of left kidney, except renal pelvis: Secondary | ICD-10-CM

## 2022-03-03 DIAGNOSIS — M4310 Spondylolisthesis, site unspecified: Secondary | ICD-10-CM | POA: Insufficient documentation

## 2022-03-03 NOTE — Patient Instructions (Addendum)
Topical treatments you can try:  - BenGay: Camphor (4%), Menthol (72%), Methyl Salicylate (90%)  - SalonPas: Camphor (3.1%), Menthol (6%), Methyl Salicylate (21%)  - Lidocaine (SalonPas lidocaine 4% and others)  - Capsaicin (SalonPas-Hot and others)  - Menthol (mint oil)  - Methyl salicylate  - Camphor  - Arnica  - Diclofenac       Will discuss order with the Cancer team. Please follow up with ortho spine.

## 2022-03-03 NOTE — Interdisciplinary (Signed)
Patient is ambulatory via cane.

## 2022-03-03 NOTE — Progress Notes (Signed)
PAIN CLINIC FOLLOW UP NOTE    Primary Care Physician Laurine Blazer    SUBJECTIVE:  This is a 71 year old male currently being treated for urothelial Bar[place] s/p chemo and surgery with chronic neck and low back pain s/p ALIF L5-S1 05/05/21 now s/p revision with Dr. Lelon Huh on 64/33/29 presenting with complaints of bilateral low back pain.. Denies any radiation of this pain. Pain is constant but is worse with leaning forward "It's hard to put my socks on" .Complains of significant muscle tightness on both sides. "Its just muscles".  He is unable to state what makes pain better.  Denies any red flag symptoms.  Not interested in medication management.    Of note: presence of a left nephrostomy tube      Pain Treatments  Previous pain procedures and response include:   -07/22/21 Lumbar TPIs (minimal relief)  -05/31/19 left C4-C 6 diagnostic MBB (80% relief)  -RFA LeftL3-L512/9/19(did not provide much relief)  -Left L3-5 MBB 06/26/2018  -Cervical ESI 01/23/2018   -Lumbar ESI 11/24/2018  Pain medication  -Tylenol as needed  -Lyrica  -Cymbalta  -Tizanidine    [facility] Return Patient Questionnaire  Main reason for today's visit: setup pain procedure for neck and lower back pain  Any other concerns: dental health  Have you had a pain procedure since your last visit? Yes  What procedure(s)?    How much relief have pain procedures provided?    Since your LAST visit to the pain clinic until NOW, how much relief have pain medications provided? Not Applicable  Over the last 12 months, how many sessions of physical therapy have you done? Zero  Of those sessions of physical therapy, how many were over the last 6 months?    If you had physical therapy, did it help?    Do you do home exercises? Yes  What exercises do you do? walking stretching  What does the pain feel like? Pressure;Tingling;Aching;Radiating;Burning;Squeezing;Heavy  Do you have? Increased Sweating;Weakness;Tingling;Tightness  Does the pain make it  hard for you to: Walk;Sleep;Sit;Exercise  What other things are hard for you to do?    Rate your pain at its worst in the past week: 9  Rate your pain at its least in the past week: 9  Rate your pain on average in the past week: 9  What number best describes how, during the past week, pain has interfered with your enjoyment of life: 7  What number best describes how, during the past week, pain has interfered with your general activity: 7  PEG Score: 8  Have you had any new studies done to evaluate your pain since your last visit? No    Patient's significant social history is:   Social History     Socioeconomic History   . Marital status: Single     Spouse name: Not on file   . Number of children: Not on file   . Years of education: Not on file   . Highest education level: Not on file   Occupational History   . Not on file   Tobacco Use   . Smoking status: Never   . Smokeless tobacco: Never   Substance and Sexual Activity   . Alcohol use: No   . Drug use: Not Currently   . Sexual activity: Not Currently     Partners: Female   Other Topics Concern   . Military Service No   . Blood Transfusions Yes   . Caffeine  Concern No   . Occupational Exposure No   . Hobby Hazards No   . Sleep Concern Yes     Comment: due to my lower back discomfort and leg discomfort   . Stress Concern Yes     Comment: Health/ housing/ financial   . Weight Concern No   . Special Diet No   . Back Care Yes     Comment: Am very careful with any activity   . Exercises Regularly Yes   . Seat Belt Use Yes   . Performs Self-Exams Yes   . Bike Helmet Use No   Social History Narrative   . Not on file     Social Determinants of Health     Financial Resource Strain: Not on file   Food Insecurity: Not on file   Transportation Needs: Not on file   Physical Activity: Not on file   Stress: Not on file   Social Connections: Not on file   Intimate Partner Violence: Not on file   Housing Stability: Not on file       Past medical history is significant for:   has a  past medical history of Chronic back pain, Congenital hydronephrosis, Gout, Headache, Hematuria, HTN (hypertension) (12/10/2021), Kidney disease, Kidney stones, Major depressive disorder, single episode, Polyarthropathy or polyarthritis of multiple sites, Retinal detachment, and Urethral stricture.    He has no past medical history of AF (atrial fibrillation) (CMS-HCC), Asthma, Chronic kidney disease (CKD), Chronic obstructive airway disease (CMS-HCC), Congestive heart failure (CMS-HCC), Heart disease, or Hypothyroidism.    Current medications include:  Current Outpatient Medications   Medication Sig Dispense Refill   . acetaminophen (TYLENOL) 325 MG tablet Take 2 tablets (650 mg) by mouth every 6 hours as needed for Mild Pain (Pain Score 1-3) (alternate with motrin). 30 tablet 0   . albuterol 108 (90 Base) MCG/ACT inhaler Inhale 2 puffs by mouth every 4 hours as needed for Wheezing. 6.7 g 0   . allopurinol (ZYLOPRIM) 100 MG tablet Take 1 tablet (100 mg) by mouth daily. 30 tablet 0   . aluminum-magnesium-simethicone (MAG-AL PLUS) 854-627-03 MG/5ML suspension Take 30 mL by mouth every 6 hours as needed for Heartburn. 240 mL 0   . bisacodyl (DULCOLAX) 10 MG suppository Insert 1 suppository (10 mg) rectally daily as needed for Constipation. 10 suppository 0   . docusate sodium (COLACE) 250 MG capsule Take 1 capsule (250 mg) by mouth 2 times daily. 60 capsule 0   . DULoxetine (CYMBALTA) 20 MG CR capsule Take 1 capsule (20 mg) by mouth daily. 90 capsule 3   . heparin 100 UNIT/ML injection 1 mL (100 Units) by IntraCATHETER route every 6 hours as needed (PICC care). 30 each 0   . heparin 5000 UNIT/ML injection Inject 1 mL (5,000 Units) under the skin every 8 hours. 90 each 0   . lidocaine (ASPERCREME) 4 % patch Apply 1 patch topically every 24 hours. Leave patch on for 12 hours, then remove for 12 hours. 30 patch 0   . melatonin 5 MG tablet Take 1 tablet (5 mg) by mouth at bedtime. 30 tablet 0   . methylPREDNISolone  (MEDROL DOSEPACK) 4 MG tablet Take as directed on package 1 each 0   . Multiple Vitamins-Minerals (MULTIVITAMIN WITH MINERALS) TABS tablet Take 1 tablet by mouth daily. 30 tablet 1   . naloxone (NARCAN) 4 mg/0.1 mL nasal spray For suspected opioid overdose, call 911! Then spray once in one nostril. Repeat after 3 minutes  if no or minimal response using a new spray in other nostril. 2 each 0   . ondansetron (ZOFRAN ODT) 8 MG disintegrating tablet Take 1 tablet (8 mg) on or under the tongue every 8 hours as needed for Nausea/Vomiting. 10 tablet 0   . oxyCODONE (ROXICODONE) 5 MG immediate release tablet Take 1 tablet (5 mg) by mouth 2 times daily as needed for Moderate Pain (Pain Score 4-6) or Severe Pain (Pain Score 7-10). 30 tablet 0   . polyethylene glycol (GLYCOLAX) 17 GM/SCOOP powder Take 17 g by mouth daily. Mix with 4 to 8 ounces of fluid (water, juice, soda, coffee, or tea) prior to administration. 255 g 0   . pregabalin (LYRICA) 100 MG capsule TAKE 1 CAPSULE BY MOUTH EVERYDAY AT BEDTIME 30 capsule 0   . senna (SENOKOT) 8.6 MG tablet Take 1 tablet (8.6 mg) by mouth daily. 30 tablet 0   . sodium chloride 0.9 % flush 10 mL by IntraCATHETER route every 8 hours. 90 each 0   . tamsulosin (FLOMAX) 0.4 MG capsule TAKE 1 CAPSULE BY MOUTH EVERY DAY 30 capsule 3   . tizanidine (ZANAFLEX) 2 MG tablet Take 1 tablet (2 mg) by mouth every 8 hours as needed (spasm). 40 tablet 0   . traZODone (DESYREL) 50 MG tablet Take 0.5 tablets (25 mg) by mouth at bedtime. 30 tablet 0   . VITAMIN D PO Take 125 mg by mouth daily.       No current facility-administered medications for this visit.       Patient's current allergies are:  Amoxicillin and Sulfa drugs    OBJECTIVE:  Physical Exam  Vitals: BP 122/81 (BP Location: Left arm, BP Patient Position: Sitting, BP cuff size: Regular)   Pulse 56   Temp 97.8 F (36.6 C) (Temporal)   General: no distress, cooperative  Mental Status:  alert, oriented x 3. Speech is clear/ normal  Affect:  anxious  Skin:  No rashes or bruises.  HEENT:  Pupils equal, not pinpoint.  Pulmonary:  Breathing easily without tachypnea or bradypnea.  Cardiac:  No LE edema.  Abdomen:  Not distended.  Ambulation: Pt is able to raise from a seated position without difficulty. Gait is not antalgic and the patient ambulates without assistance.   Musculoskeletal:    Back: tender to palpation over the midline spine, bilateral lumbar paravertebrals and surrounding lumbar muscles. Increase pain with flexion > extension   Upper Ext: wnls   Lower Ext: wnls      ASSESSMENT  Encounter Diagnoses   Name Primary?   . Myalgia, other site    . Lumbosacral spondylosis without myelopathy    . S/P lumbar fusion    . Degenerative spondylolisthesis    . Urothelial carcinoma of kidney, left (CMS-HCC)    . Trigger point with back pain      This is a 71 year old male undergoing treatment for left upper tract urothelial carcinoma status post chemo and surgery with left nephrostomy tube with chronic neck and low back pain s/p ALIF L5-S1 05/05/2021 now s/p revision with Dr. Lelon Huh on 33/00/76 presenting with complaints of bilateral low back pain.  Back pain is located axially in denies any radiation.  Significant myofascial component to his pain.  Will consider lumbar trigger point injections but will obtain clearance from Urology secondary to nephrostomy tube in region.      PLAN  Medication Plan:   Topical treatments you can try:  - BenGay: Camphor (4%), Menthol (  37%), Methyl Salicylate (48%)  - SalonPas: Camphor (3.1%), Menthol (6%), Methyl Salicylate (27%)  - Lidocaine (SalonPas lidocaine 4% and others)  - Capsaicin (SalonPas-Hot and others)  - Menthol (mint oil)  - Methyl salicylate  - Camphor  - Arnica  - Diclofenac     -discussed medicinal cannabis with patient in the past.  Not interested at this time.     Procedure Plan:   -Bilateral lumbar trigger point injections.  Will obtain clearance from Urology before proceeding.    Other:  Encouraged to  follow back up with Ortho Spine.  Consider PT.    Follow-up:   As needed    Dr. Herbie Drape is the attending physician and was immediately available.      Total duration of encounter spent in pre-visit (reviewing last visit, reviewing prior Epic notes and reviewing images), intra-visit (updating relevant history, performing physical exam, creating a treatment plan and medical discussion with patient), and post-visit (note completion and placing of orders) on the day of the encounter, excluding separately reportable services/procedures: 30 minutes.     Addendum: Clearance was received from Carlota Raspberry, MD to proceed with lumbar trigger point injections. TPIs are ordered.

## 2022-03-04 NOTE — Telephone Encounter (Signed)
LMTCB    Per previous documentation, patient has already spoken with [facility] RN and concerns were clarified at time of call.

## 2022-03-04 NOTE — Telephone Encounter (Signed)
Called pt to schedule BCG.     Pt began asking procedure/medication questions. Informed I could not answer as I am not a nurse. Pt states hes been waiting for a call back from [place] [facility]. Does not want to proceed until he questions have been answered.

## 2022-03-04 NOTE — Telephone Encounter (Signed)
Called pt, left message to call back and schedule BCG.

## 2022-03-04 NOTE — Telephone Encounter (Signed)
Caller: Patient  Phone # [phone]  Provider: Dr. Sallyanne Kuster  Notes:     Patient returning [facility] call to schedule BCG, attempted to contact [facility] and no answer or VM.     Informed patient a message will be sent to [facility] assist with scheduling.     Please assist.    Caller has been advised of 24-48 hr turnaround time.

## 2022-03-07 ENCOUNTER — Encounter (HOSPITAL_BASED_OUTPATIENT_CLINIC_OR_DEPARTMENT_OTHER): Payer: Self-pay | Admitting: Physician Assistant

## 2022-03-07 DIAGNOSIS — M549 Dorsalgia, unspecified: Secondary | ICD-10-CM

## 2022-03-07 DIAGNOSIS — M7918 Myalgia, other site: Secondary | ICD-10-CM | POA: Insufficient documentation

## 2022-03-07 NOTE — Telephone Encounter (Signed)
Caller: Patient  Relationship to patient: Self  Phone # [phone]  Preferred Method of Communication: Call back    Notes: Patient would like to speak with Ana-RN, did not want to specify reason for call but said it was in regards to his procedure ordered by Dr.Bagrodia.          Please assist     Caller has been advised of  72 hr turnaround time.

## 2022-03-07 NOTE — Telephone Encounter (Signed)
Pt calling to speak w/ Vicente Males "RN" stating has been awaiting call back. Pt states he has questions to talk to Vicente Males about before scheduling procedure. Please advise.     Best call back: [phone]

## 2022-03-07 NOTE — Telephone Encounter (Signed)
Pt is calling back to speak with Vicente Males.  Please call:[phone]

## 2022-03-08 NOTE — Telephone Encounter (Signed)
Spoke with pt using two pt identifiers.    States he was calling to ask about Medrol because he had not received prescription but he contact pharmacy and this is available for pick up. He would like to set up BCG. Informed will route message to [facility] team to reach out to him to schedule. Voiced understanding and appreciation for call back.

## 2022-03-08 NOTE — Telephone Encounter (Signed)
Called pt, no answer, left message for a return call.     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    -installation of additional mitomycin does not seem like a safe option as it stands given the fact that he has decreased drainage from the nephrostomy tube.  I will initiate a course of steroids and repeat a nephrostogram in 2 weeks.    -bladder cancer: we reviewed the natural history of non-muscle-invasive bladder cancer including features regarding recurrence and progression.  We briefly reviewed risk stratification based on EORTC risk table parameters and indication for intravesical therapy based on these data.  Given his tumor characteristics, we will initiate intravesical therapy at this time.  He will follow up in 3 months for surveillance cystoscopy

## 2022-03-08 NOTE — Telephone Encounter (Signed)
Pt is calling to schedule BCG, pt was having concerns about the medrol pill and wanted to clarify how long before he needs to take it before the BCG but is ready to schedule. Please assist, thank you.    [phone]

## 2022-03-09 NOTE — Telephone Encounter (Signed)
Caller: pt  Relationship to patient: self  Phone # [phone]  Provider: Sallyanne Kuster  Notes:     Patient is calling to speak to [place] in regards to scheduling his BCG so he can start his medication. Requesting call back. Please assist

## 2022-03-11 NOTE — Telephone Encounter (Signed)
Called pt, BCG appts scheduled.

## 2022-03-14 ENCOUNTER — Other Ambulatory Visit (HOSPITAL_BASED_OUTPATIENT_CLINIC_OR_DEPARTMENT_OTHER): Payer: Self-pay | Admitting: Urology

## 2022-03-14 DIAGNOSIS — C678 Malignant neoplasm of overlapping sites of bladder: Secondary | ICD-10-CM

## 2022-03-15 ENCOUNTER — Other Ambulatory Visit (HOSPITAL_BASED_OUTPATIENT_CLINIC_OR_DEPARTMENT_OTHER): Payer: Self-pay | Admitting: Surgery

## 2022-03-15 LAB — EMMI,  PAIN MANAGEMENT: ACUTE IN HOSPITAL: EMMI Video Order Number: 12811737027

## 2022-03-15 LAB — EMMI , ANESTHESIA (ADULT): EMMI Video Order Number: 12355855227

## 2022-03-15 LAB — EMMI , PATIENT SATISFACTION: HOSPITAL DISCHARGE EXPECTATIONS: EMMI Video Order Number: 12705382740

## 2022-03-17 ENCOUNTER — Other Ambulatory Visit: Payer: Self-pay

## 2022-03-18 ENCOUNTER — Telehealth (HOSPITAL_BASED_OUTPATIENT_CLINIC_OR_DEPARTMENT_OTHER): Payer: Self-pay

## 2022-03-18 NOTE — Telephone Encounter (Signed)
[facility] / Medical Oncology Social Work Telephone note:    Pt left me a message he wanted to meet on Monday 4/3 when he comes to [facility] for ongoing social work support.    I called him back and left message that I can meet on that day after 10:15 or 10:30.    Will follow up PRN.    [name], [place] Social Worker  [phone]

## 2022-03-21 ENCOUNTER — Encounter (HOSPITAL_BASED_OUTPATIENT_CLINIC_OR_DEPARTMENT_OTHER): Payer: Self-pay

## 2022-03-21 ENCOUNTER — Ambulatory Visit: Payer: Medicare Other | Attending: Urology

## 2022-03-21 VITALS — BP 122/69 | HR 64 | Temp 97.5°F | Resp 16 | Ht 67.0 in | Wt 186.6 lb

## 2022-03-21 DIAGNOSIS — C642 Malignant neoplasm of left kidney, except renal pelvis: Secondary | ICD-10-CM | POA: Insufficient documentation

## 2022-03-21 DIAGNOSIS — C679 Malignant neoplasm of bladder, unspecified: Secondary | ICD-10-CM | POA: Insufficient documentation

## 2022-03-21 MED ORDER — ALAVERT PO
10.00 mg | Freq: Every day | ORAL | Status: DC
Start: ? — End: 2023-02-27

## 2022-03-21 MED ORDER — TRIAMCINOLONE ACETONIDE 0.1 % EX CREA
1.00 | TOPICAL_CREAM | Freq: Two times a day (BID) | CUTANEOUS | Status: DC
Start: ? — End: 2023-04-10

## 2022-03-21 MED ORDER — CICLOPIROX 0.77 % EX GEL
Freq: Two times a day (BID) | CUTANEOUS | Status: DC
Start: ? — End: 2022-12-01

## 2022-03-21 MED ORDER — KETOTIFEN FUMARATE 0.025 % OP SOLN: 1.00 [drp] | Freq: Two times a day (BID) | OPHTHALMIC | Status: AC

## 2022-03-21 MED ORDER — SODIUM CHLORIDE 0.9 % IJ SOLN (CUSTOM)
25.0000 mg | Freq: Once | INTRAVESICAL | Status: AC
Start: 2022-03-21 — End: 2022-03-21
  Administered 2022-03-21 (×2): 25 mg via INTRAVESICAL
  Filled 2022-03-21: qty 25

## 2022-03-21 MED ORDER — LIDOCAINE HCL 2% EX GEL (UROJET)
10.0000 mL | Freq: Once | Status: DC | PRN
Start: 2022-03-21 — End: 2022-03-21
  Administered 2022-03-21 (×2): 10 mL via URETHRAL

## 2022-03-21 MED ORDER — LIDOCAINE HCL 2% EX GEL (UROJET)
Status: AC
Start: 2022-03-21 — End: 2022-03-21
  Filled 2022-03-21: qty 10

## 2022-03-21 MED ORDER — SYSTANE ULTRA OP
OPHTHALMIC | Status: DC
Start: ? — End: 2022-12-01

## 2022-03-21 MED ORDER — ASCORBIC ACID 500 MG OR TABS
500.00 mg | ORAL_TABLET | Freq: Every day | ORAL | Status: DC
Start: ? — End: 2022-12-01

## 2022-03-21 NOTE — Patient Instructions (Signed)
BCG Treatment for Bladder Cancer    WHAT IS BCG?  Bacillus Calmette-Guerin (BCG) is a type of bacterium used in bladder cancer to prevent the cancer from coming back after surgery. It is placed into the bladder through a catheter (tube). BCG is also given as a vaccine for tuberculosis (TB) in some countries.    HOW DOES BCG WORK?  When given in the bladder, BCG causes an immune response that brings disease- fighting cells to the bladder. Your immune system can then fight against the cancer cells that are found in the lining of the bladder.    WHAT IS THE USUAL COURSE OF TREATMENT?  Once a week for 6 weeks or once a week for 3 weeks or monthly, as directed by your physician. After your last treatment, you will be scheduled for a cystoscopy in 4-6 weeks.    WHAT DO I NEED TO TELL MY PROVIDER BEFORE I RECEIVE BCG?  If you have ever had an bad reaction to BCG  If you have any of these health problems:  Active TB  Any cancer beside bladder cancer  HIV/AIDS  Weak immune system  Any infection or fever, or if you are taking antibiotics  Blood in the urine  Problems with urinary retention or incontinence  If you are receiving cancer therapy (for example, chemotherapy, radiation) or medications that suppress your immune system (for example, steroids).  If you are taking a diuretic (water pill).  If you had a biopsy, transuretheral resection of bladder tumor (TURBT), or bladder catheterization that caused injury or bleeding within the past two weeks.  If you are pregnant or may become pregnant during treatment. Do not become pregnant while on therapy.  If you are breastfeeding or plan to breastfeed during treatment. We recommend stopping breastfeeding while on BCG.    BEFORE BCG TREATMENT  Do not drink caffeine or alcohol on treatment days.  Purchase household bleach. You will need it at home after treatment to prevent BCG from entering the water system.  Do not drink fluids for four (4) hours before treatment.  Please  bring an absorbent pad or underwear if you normally experience leaks.    WHAT CAN I EXPECT DURING BCG TREATMENT?  BCG will be given in a treatment room at the Urology Clinic or infusion center.  Do not drink fluids for the two (2) hours while BCG is in your bladder.  If you feel any discomfort during treatment, notify your nurse immediately.  Positioning: The bladder is an expandable sac-like organ that contracts when it is empty. The inner lining of the bladder tucks in the folds and expands out to accommodate liquid. For this reason, no turning or lying is necessary. You may position yourself in whatever position you prefer while at home.  At each visit for BCG treatment, you will be asked for a urine sample. If you have signs or symptoms of a bladder infection or blood in the urine, you will not receive BCG that day.  If you are having treatment you will be asked to undress from the waist down and given a sheet to cover yourself.  The nurse will ask you to lie on your back on the bed; they will clean your genital area.  A catheter will be placed into your urethra (tube that carries urine out of your body). This catheter will let any urine drain from your bladder. Once the urine is drained, BCG will be placed into your bladder through the same catheter.    Lidocaine gel, a numbing medication, may be used to make the catheter placement more comfortable.  After BCG is given, the catheter is removed and you will be allowed to go home.  You will hold the BCG solution in your bladder for two (2) hours; you may urinate (pee) sooner if absolutely necessary.  After 2 hours, urinate in your home toilet.  Both men and women should sit on the toilet to urinate to prevent splashing of urine.  Do not flush the toilet! Disinfect urine for 15 minutes with two (2) cups of household bleach before flushing.  Wash your hands thoroughly with warm water and soap.    Follow these same instructions every time you urinate for the first six  (6) hours, and plan to stay home and use the same toilet for those six (6) hours.    AFTER BCG TREATMENT  You may return to your normal activities and social contact. You do not need to restrict your movements and regular routine. You can eat and drink as you normally do.  Drink plenty of fluids after treatment to flush BCG out of your bladder.  If urine contacts your skin, thoroughly wash the area with soap and water.  If any clothes or linens have urine on them, wash separately in a washing machine on the hot cycle with bleach.  If urine splashes or spills on the floor, toilet seat, or other areas, cover the area with bleach-soaked paper towels for fifteen (15) minutes, then clean with soap and water.  If you are sexually active, you or your partner must wear a condom during the treatment course and for one (1) week after treatment has ended.    SIDE EFFECTS  You may feel painful or difficult urination, burning, urgency, frequency or see blood in urine (hematuria).  Flu-like symptoms: Fever, malaise, chills, fatigue, generalized aches and pains    Most patients have mild symptoms of bladder irritation. You may also have flu-like symptoms. Symptoms usually begin 4 to 6 hours after BCG is given and last 2 to 3 days after treatment. Side effects may be worse each time you have a BCG treatment.    The following symptoms may be signs of a rare, but serious systemic BCG infection. Contact your provider or nurse Urology Clinic ([phone]), on-call urologist ([phone]) during weekends, nights, and holidays,  or go to the emergency room immediately if you experience any of the following symptoms:  Fever higher than 103?F anytime OR Fever higher than 101.3?F that lasts for more than 48 hours  Increase in urinary symptoms and flu-like symptoms lasting more than 48 hours  Signs of liver problems like dark urine, feeling tired, not hungry, upset stomach or stomach pain, light-colored stools, throwing up, or yellow skin  or eyes  Joint pain, skin rash, cough  Extreme fatigue (unable to take care of yourself)    Call 911 immediately if you experience any of the following symptoms:  Shortness of breath  Confusion  Dizziness or lightheadedness

## 2022-03-21 NOTE — Interdisciplinary (Signed)
Patient arrived ambulatory, alert and without signs or symptoms of distress     Patient presents for 1st dose of BCG. Patient voided 5 cc urine, no gross hematuria noted.  Patient denies s/s of UTI, dysuria/frequency in urination/flank/suprapubic pain. VSS.     Patient has a left nephrostomy tube and voids very little. Discussed with Dr. Sallyanne Kuster who approved continuation with BCG treatment with capped foley catheter.       Procedure was explained by nurse and understood by patient.      Handout re: BCG given to patient.      Area prepped with Betadine in sterile technique. Lidocaine 2% topical jelly instilled into urethra. 16 FR straight foley catheter inserted in standard sterile procedure, bladder drained. BCG 25 mg in 50 ml normal saline instilled into bladder. Catheter capped for 2 hours and then bladder drained. Catheter removed. BCG disposed of per protocol.      Patient was instructed to retain the solution in the bladder for a minimum 2 hrs.,      Procedure well tolerated by the patient.     Information/teaching given to patient including s/s of infection/bleeding/symptom control and when to notify MD, patient verbalized understanding.      Patient discharged to home in stable condition.

## 2022-03-21 NOTE — Progress Notes (Addendum)
[facility] / Medical Oncology Social Work Note    Current status:  Met with Stephen [name] today in the clinic treatment room as per his request while he received his BCG therapy.  Stephen [name] displayed a good understanding of his current bladder [place] status and treatment plan.  He voiced satisfaction with his care and intent to comply with weekly BCG treatments (today is day 1 of 6).    He also provided an update on his recent health issues, such as primary care (est with new PCP at [place], [place], [phone]), dental, GI, and vascular issues.  Referred Stephen [name] to review his concerns with his PCP; he said he'd take this under advisement.    Stephen [name] also remains on a housing waiting list though a community agency.  He is maintaining his personal information to remain active on the list.      Stephen [name] briefly discusses his family dynamics, living with his sister, brother- in-law and niece. Situation remains complex but liveable    Intervention:  Provided redirection, empathic listening and supportive counseling, anticipatory guidance.  Collaborated with Wayland Salinas, RN.  Promoted positive coping strengths/skills and self-care.  Focused on client strengths.    Plan:   Reviewed my contact info and how Stephen [name] can reach me by phone in the future.   Pt to call back as needed for general support.    Will follow PRN.    [name], [place] Social Worker  [phone]

## 2022-03-22 ENCOUNTER — Other Ambulatory Visit (HOSPITAL_BASED_OUTPATIENT_CLINIC_OR_DEPARTMENT_OTHER): Payer: Self-pay | Admitting: Nurse Practitioner

## 2022-03-22 ENCOUNTER — Other Ambulatory Visit: Payer: Self-pay

## 2022-03-22 DIAGNOSIS — Z981 Arthrodesis status: Secondary | ICD-10-CM

## 2022-03-22 MED ORDER — PREGABALIN 100 MG OR CAPS
ORAL_CAPSULE | ORAL | 0 refills | Status: DC
Start: 2022-03-22 — End: 2022-04-19

## 2022-03-24 ENCOUNTER — Encounter (HOSPITAL_BASED_OUTPATIENT_CLINIC_OR_DEPARTMENT_OTHER)
Admission: RE | Disposition: A | Discharge status: Home Health | Payer: Self-pay | Source: Ambulatory Visit | Attending: Anesthesiology

## 2022-03-24 ENCOUNTER — Ambulatory Visit
Admission: RE | Admit: 2022-03-24 | Discharge: 2022-03-24 | Disposition: A | Discharge status: Home / Self Care | Payer: Medicare Other | Source: Ambulatory Visit | Attending: Anesthesiology | Admitting: Anesthesiology

## 2022-03-24 DIAGNOSIS — Z880 Allergy status to penicillin: Secondary | ICD-10-CM | POA: Insufficient documentation

## 2022-03-24 DIAGNOSIS — I10 Essential (primary) hypertension: Secondary | ICD-10-CM | POA: Insufficient documentation

## 2022-03-24 DIAGNOSIS — Z87442 Personal history of urinary calculi: Secondary | ICD-10-CM | POA: Insufficient documentation

## 2022-03-24 DIAGNOSIS — M5387 Other specified dorsopathies, lumbosacral region: Secondary | ICD-10-CM | POA: Insufficient documentation

## 2022-03-24 DIAGNOSIS — M549 Dorsalgia, unspecified: Secondary | ICD-10-CM | POA: Insufficient documentation

## 2022-03-24 DIAGNOSIS — M7918 Myalgia, other site: Secondary | ICD-10-CM | POA: Insufficient documentation

## 2022-03-24 DIAGNOSIS — Z882 Allergy status to sulfonamides status: Secondary | ICD-10-CM | POA: Insufficient documentation

## 2022-03-24 SURGERY — PAIN TPI (2 OR MORE MUSCLES)
Laterality: Bilateral

## 2022-03-24 MED ORDER — BUPIVACAINE HCL (PF) 0.25 % IJ SOLN
INTRAMUSCULAR | Status: AC
Start: 2022-03-24 — End: 2022-03-24
  Filled 2022-03-24: qty 10

## 2022-03-24 MED ORDER — BUPIVACAINE HCL (PF) 0.25 % IJ SOLN
INTRAMUSCULAR | Status: DC | PRN
Start: 2022-03-24 — End: 2022-03-24
  Administered 2022-03-24 (×2): 9 mL

## 2022-03-24 SURGICAL SUPPLY — 16 items
APPLICATOR CHLORAPREP 3ML, CLEAR (Prep Solutions) ×2 IMPLANT
COVER ULTRASOUND PROBE W/ GEL (Drapes/towels) IMPLANT
MARKER SECURELINE SURG SKIN (Misc Medical Supply) ×2 IMPLANT
NEEDLE BD HYPO 27G X 1.25" (Needles/punch/cannula/biopsy) IMPLANT
NEEDLE BD HYPO 30G X 1" (Needles/punch/cannula/biopsy) ×2 IMPLANT
NEEDLE ECHOBLOCK MSK 21G X 3 1/8" (Needles/punch/cannula/biopsy) IMPLANT
NEEDLE ECHOBLOCK MSK 21G X 4" (Needles/punch/cannula/biopsy) IMPLANT
NEEDLE ECHOBLOCK MSK 22G X 2" (Needles/punch/cannula/biopsy)
NEEDLE PROTECT 18G X 1.5" (Needles/punch/cannula/biopsy) ×2
NEEDLE PROTECT 25G X 1.5" (Needles/punch/cannula/biopsy) IMPLANT
NEEDLE SPINE QUINCKE 23G X 3.5" (Needles/punch/cannula/biopsy) IMPLANT
NEEDLE SPINE QUINCKE 23G X 5" (Needles/punch/cannula/biopsy) IMPLANT
NEEDLE SPINE QUINCKE 25G X 3.5" (Needles/punch/cannula/biopsy)
NEEDLE SPINE QUINCKE 25G X 6" (Needles/punch/cannula/biopsy)
TOWELS OR BLUE 4-PACK STERILE, DISPOSABLE (Drapes/towels)
TRAY SINGLE SHOT EPIDURAL (Procedure Packs/kits) ×2 IMPLANT

## 2022-03-24 NOTE — Op Note (Signed)
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 The patient has benefited from similar injections in the past.    Procedure in detail:  Written informed consent was obtained.  The chart was reviewed, questions were answered and the patient wished to proceed. The patient had no contraindications to the procedure.  Vital signs were stable. Standard monitoring was applied.  The patient and the physician assistant confirmed the site of injection after an official "time out."     Localization Time Out:  An additional intraoperative timeout, specifically to confirm accurate localization, was conducted by the physician assistant.     The patient remained awake and alert throughout the procedure.  The patient was placed in the prone position.  The skin was prepped with chlorhexidine and sterile drapes were applied.  Using palpation of bony and soft tissue landmarks, active trigger points were located in the muscle groups listed above.    Next these muscles were injected with a total of Bupivacaine (0.25%)  9 mL using a 30 gauge 1 inch needle. No paresthesias occurred. The needle was removed. The patient did not experience any hemodynamic or neurologic sequelae.      Post operative instructions were explained and given to the patient. The patient tolerated the procedure well and was discharged in stable health.    Dr. Bridgett Larsson is the attending physician and was immediately available.

## 2022-03-24 NOTE — H&P (Signed)
Ambulatory Surgery/Invasive Procedure History and Physical      Primary Care Physician Laurine Blazer    Chief Complaint:  low back pain     71 year old male     There were no vitals taken for this visit.    Past Medical History:   Diagnosis Date   . Chronic back pain    . Congenital hydronephrosis    . Gout    . Headache    . Hematuria    . HTN (hypertension) 12/10/2021   . Kidney disease    . Kidney stones    . Major depressive disorder, single episode    . Polyarthropathy or polyarthritis of multiple sites    . Retinal detachment    . Urethral stricture        Allergies   Allergen Reactions   . Amoxicillin Rash     Not allergic. Patient tolerating ampicillin,    . Sulfa Drugs Unspecified       No current facility-administered medications for this encounter.       I have reviewed the past medical history, allergies and current medications as documented in the electronic health record.      Physical Exam       Can this patient make their own healthcare decisions?  Yes    Chest:  Breaths easily     Heart:  RRR    Abdomen:  Soft    Pain Management Needs/Options discussed.    No Advanced Directives.      Resuscitative Status:  Full Code, Full Care    Diagnosis:    ICD-10-CM ICD-9-CM    1. Myalgia, other site  M79.18 729.1     Added automatically from request for surgery [id number]      2. Trigger point with back pain  M54.9 724.5     Added automatically from request for surgery [id number]          Procedure: Trigger point injection    The procedure is designed to be therapeutic.    Discussed Risks, Benefits, and Alternatives to procedure.  Questions answered.  Patient voiced understanding and wished to proceed. Consent signed.      Risk include:    Minor adverse effects: Superficial infection, bleeding, bruising, headache, worsening of pain, lack of benefit, flushing or redness of the face and neck, elevated blood pressure, increased blood sugar, difficulty sleeping, changes in mood, nausea, or vomiting. A transient  sensation of tingling or shooting during the injection.    Rare but serious adverse effects: Allergic reaction, internal bleeding, infection in deeper tissue such as the discs or spine requiring prolonged or intravenous antibiotics or surgery, punctured lungs, changes in blood pressure, pulse and respirations, cardiac arrest, permanent injury to nerves or spinal cord, paralysis, stroke and even death.     See procedure note of same date.

## 2022-03-28 ENCOUNTER — Other Ambulatory Visit: Payer: Self-pay | Admitting: *Deleted

## 2022-03-28 ENCOUNTER — Telehealth: Payer: Self-pay | Admitting: *Deleted

## 2022-03-28 ENCOUNTER — Ambulatory Visit
Admission: RE | Admit: 2022-03-28 | Discharge: 2022-03-28 | Disposition: A | Discharge status: Home / Self Care | Payer: Medicare Other | Source: Ambulatory Visit | Attending: Surgery | Admitting: Surgery

## 2022-03-28 ENCOUNTER — Encounter (HOSPITAL_BASED_OUTPATIENT_CLINIC_OR_DEPARTMENT_OTHER): Payer: Self-pay | Admitting: Surgery

## 2022-03-28 ENCOUNTER — Ambulatory Visit (HOSPITAL_BASED_OUTPATIENT_CLINIC_OR_DEPARTMENT_OTHER): Payer: Medicare Other

## 2022-03-28 ENCOUNTER — Encounter (HOSPITAL_BASED_OUTPATIENT_CLINIC_OR_DEPARTMENT_OTHER)
Admission: RE | Disposition: A | Discharge status: Home Health | Payer: Self-pay | Source: Ambulatory Visit | Attending: Surgery

## 2022-03-28 VITALS — BP 119/75 | HR 60 | Temp 97.7°F | Resp 16 | Ht 67.0 in | Wt 192.7 lb

## 2022-03-28 DIAGNOSIS — Z87891 Personal history of nicotine dependence: Secondary | ICD-10-CM

## 2022-03-28 DIAGNOSIS — Z122 Encounter for screening for malignant neoplasm of respiratory organs: Secondary | ICD-10-CM

## 2022-03-28 DIAGNOSIS — C642 Malignant neoplasm of left kidney, except renal pelvis: Secondary | ICD-10-CM

## 2022-03-28 DIAGNOSIS — N1330 Unspecified hydronephrosis: Secondary | ICD-10-CM | POA: Insufficient documentation

## 2022-03-28 DIAGNOSIS — C662 Malignant neoplasm of left ureter: Secondary | ICD-10-CM

## 2022-03-28 DIAGNOSIS — C678 Malignant neoplasm of overlapping sites of bladder: Secondary | ICD-10-CM

## 2022-03-28 SURGERY — IR TUBE CHECK
Anesthesia: Local | Laterality: Left

## 2022-03-28 MED ORDER — IODIXANOL 320 MG/ML IV SOLN
INTRAVENOUS | Status: DC | PRN
Start: 2022-03-28 — End: 2022-03-28
  Administered 2022-03-28 (×2): 20 mL

## 2022-03-28 MED ORDER — IODIXANOL 320 MG/ML IV SOLN
INTRAVENOUS | Status: AC
Start: 2022-03-28 — End: 2022-03-28
  Filled 2022-03-28: qty 50

## 2022-03-28 MED ORDER — LIDOCAINE HCL 2% EX GEL (UROJET)
Status: AC
Start: 2022-03-28 — End: 2022-03-28
  Filled 2022-03-28: qty 10

## 2022-03-28 MED ORDER — LIDOCAINE HCL 2% EX GEL (UROJET)
10.0000 mL | Freq: Once | Status: DC | PRN
Start: 2022-03-28 — End: 2022-03-28

## 2022-03-28 MED ORDER — SODIUM CHLORIDE (PF) 0.9 % IJ SOLN
25.0000 mg | Freq: Once | INTRAVESICAL | Status: AC
Start: 2022-03-28 — End: 2022-03-28
  Administered 2022-03-28 (×2): 25 mg via INTRAVESICAL
  Filled 2022-03-28: qty 25

## 2022-03-28 SURGICAL SUPPLY — 8 items
APPLICATOR CHLORAPREP 26ML, ~~LOC~~ (Prep Solutions)
COVER PROBE MICROTEK INTRAOPERATIVE 5" X 96" PC1308 (Drapes/towels) IMPLANT
DECANTER BAG-A-JET STERILE (Misc Medical Supply) ×2
NEEDLE BLUNT FILL 18G X 1.5" (Needles/punch/cannula/biopsy) ×2 IMPLANT
PREP CHLOROPREP 10.5ML 1-STEP (Prep Solutions)
PROCEDURE PACK - IR NON-VASCULAR (Procedure Packs/kits) ×2 IMPLANT
SYRINGE HYPO LL 20CC (Needles/punch/cannula/biopsy) ×2
TOWELS OR BLUE 4-PACK STERILE, DISPOSABLE (Drapes/towels)

## 2022-03-28 NOTE — Telephone Encounter (Signed)
Left message for pt to call to schedule f/u lung screening CT scan.  ?

## 2022-03-28 NOTE — Interdisciplinary (Signed)
Patient arrived ambulatory, alert and without signs or symptoms of distress     Patient presents for 2nd dose of BCG. Patient voided 10 cc urine, no gross hematuria noted.  Patient denies s/s of UTI, dysuria/frequency in urination/flank/suprapubic pain. VSS.      Procedure was explained by nurse and understood by patient.     Area prepped with Betadine in sterile technique. Lidocaine 2% topical jelly instilled into urethra. [place] foley catheter inserted in standard sterile procedure, bladder drained. BCG 25 mg in 50 ml normal saline instilled into bladder. BCG dwelled for 2 hours before drained in bag and catheter removed. BCG disposed per policy.      Procedure well tolerated by the patient.     Information/teaching given to patient including s/s of infection/bleeding/symptom control and when to notify MD, patient verbalized understanding.      Patient discharged to home in stable condition.

## 2022-03-28 NOTE — Plan of Care (Signed)
Problem: Promotion of Perioperative Health and Safety  Goal: Promotion of Health and Safety of the Perioperative Patient  Description: The patient remains safe, receives treatment appropriate to the surgical intervention and patient's physiological needs and is discharged or transferred to the appropriate level of care.    Information below is the current care plan.  Outcome: Progressing

## 2022-03-28 NOTE — Discharge Instructions (Signed)
NEPHROSTOMY TUBE EXCHANGE    Wound Care  ALWAYS [facility] YOUR HANDS BEFORE AND AFTER WOUND CARE  You may have some clear-to-light yellow drainage around the catheter insertion site for the first 1 to 3 days. This is normal  A tube from your kidney through the skin on your back helps the urine flow from your kidney into a bag. You may see some blood in the bag at first. This is normal and should clear over the next 24-72 hours.  Change the bandage in 24 hours looking for any redness, swelling or drainage-  You may use a cotton ball to clean around the site.  Once that initial bandage is changed, you need to change the dressing often enough to keep your skin clean and dry    Contact your primary care doctor or Interventional Radiology Department if  You have a temperature of 101 degrees and chills  You have pain in your back or side that will not go away or is getting worse  You have urine that smells bad or looks cloudy, or urine is leaking around the catheter  You notice unusual changes in the skin around the area of the catheter such as increased swelling, redness, bleeding, or soreness    Go to your nearest Emergency Room or call 9-1-1 if  You have a temperature over 102 degrees  You have uncontrollable pain, shortness of breath, shaking or chills    Medication  If you received sedation: The medications you received for the procedure are still in your system. Do not consume alcohol, operate heavy machinery, drive, or make important decisions for the rest of the day  You may resume taking your regular medications  If you have mild pain, you may take Acetaminophen   DO NOT take Ibuprofen (Motrin) or Naproxen (Aleve) for the next 24 hours     Activity  You may return to your normal activity the day after the procedure  No exercising or lifting heavy objects (nothing greater than 10 pounds) for the next 72 hours  Avoid any activity that causes a pulling sensation, pain around the catheter, or kinking in the  catheter  You may take a shower tomorrow.  DO NOT soak in water            Follow-up Appointment    Appointments [phone]  Nurse line [phone]

## 2022-03-28 NOTE — Patient Instructions (Signed)
BCG Treatment for Bladder Cancer    WHAT IS BCG?  Bacillus Calmette-Guerin (BCG) is a type of bacterium used in bladder cancer to prevent the cancer from coming back after surgery. It is placed into the bladder through a catheter (tube). BCG is also given as a vaccine for tuberculosis (TB) in some countries.    HOW DOES BCG WORK?  When given in the bladder, BCG causes an immune response that brings disease- fighting cells to the bladder. Your immune system can then fight against the cancer cells that are found in the lining of the bladder.    WHAT IS THE USUAL COURSE OF TREATMENT?  Once a week for 6 weeks or once a week for 3 weeks or monthly, as directed by your physician. After your last treatment, you will be scheduled for a cystoscopy in 4-6 weeks.    WHAT DO I NEED TO TELL MY PROVIDER BEFORE I RECEIVE BCG?  If you have ever had an bad reaction to BCG  If you have any of these health problems:  Active TB  Any cancer beside bladder cancer  HIV/AIDS  Weak immune system  Any infection or fever, or if you are taking antibiotics  Blood in the urine  Problems with urinary retention or incontinence  If you are receiving cancer therapy (for example, chemotherapy, radiation) or medications that suppress your immune system (for example, steroids).  If you are taking a diuretic (water pill).  If you had a biopsy, transuretheral resection of bladder tumor (TURBT), or bladder catheterization that caused injury or bleeding within the past two weeks.  If you are pregnant or may become pregnant during treatment. Do not become pregnant while on therapy.  If you are breastfeeding or plan to breastfeed during treatment. We recommend stopping breastfeeding while on BCG.    BEFORE BCG TREATMENT  Do not drink caffeine or alcohol on treatment days.  Purchase household bleach. You will need it at home after treatment to prevent BCG from entering the water system.  Do not drink fluids for four (4) hours before treatment.  Please  bring an absorbent pad or underwear if you normally experience leaks.    WHAT CAN I EXPECT DURING BCG TREATMENT?  BCG will be given in a treatment room at the Urology Clinic or infusion center.  Do not drink fluids for the two (2) hours while BCG is in your bladder.  If you feel any discomfort during treatment, notify your nurse immediately.  Positioning: The bladder is an expandable sac-like organ that contracts when it is empty. The inner lining of the bladder tucks in the folds and expands out to accommodate liquid. For this reason, no turning or lying is necessary. You may position yourself in whatever position you prefer while at home.  At each visit for BCG treatment, you will be asked for a urine sample. If you have signs or symptoms of a bladder infection or blood in the urine, you will not receive BCG that day.  If you are having treatment you will be asked to undress from the waist down and given a sheet to cover yourself.  The nurse will ask you to lie on your back on the bed; they will clean your genital area.  A catheter will be placed into your urethra (tube that carries urine out of your body). This catheter will let any urine drain from your bladder. Once the urine is drained, BCG will be placed into your bladder through the same catheter.    Lidocaine gel, a numbing medication, may be used to make the catheter placement more comfortable.  After BCG is given, the catheter is removed and you will be allowed to go home.  You will hold the BCG solution in your bladder for two (2) hours; you may urinate (pee) sooner if absolutely necessary.  After 2 hours, urinate in your home toilet.  Both men and women should sit on the toilet to urinate to prevent splashing of urine.  Do not flush the toilet! Disinfect urine for 15 minutes with two (2) cups of household bleach before flushing.  Wash your hands thoroughly with warm water and soap.    Follow these same instructions every time you urinate for the first six  (6) hours, and plan to stay home and use the same toilet for those six (6) hours.    AFTER BCG TREATMENT  You may return to your normal activities and social contact. You do not need to restrict your movements and regular routine. You can eat and drink as you normally do.  Drink plenty of fluids after treatment to flush BCG out of your bladder.  If urine contacts your skin, thoroughly wash the area with soap and water.  If any clothes or linens have urine on them, wash separately in a washing machine on the hot cycle with bleach.  If urine splashes or spills on the floor, toilet seat, or other areas, cover the area with bleach-soaked paper towels for fifteen (15) minutes, then clean with soap and water.  If you are sexually active, you or your partner must wear a condom during the treatment course and for one (1) week after treatment has ended.    SIDE EFFECTS  You may feel painful or difficult urination, burning, urgency, frequency or see blood in urine (hematuria).  Flu-like symptoms: Fever, malaise, chills, fatigue, generalized aches and pains    Most patients have mild symptoms of bladder irritation. You may also have flu-like symptoms. Symptoms usually begin 4 to 6 hours after BCG is given and last 2 to 3 days after treatment. Side effects may be worse each time you have a BCG treatment.    The following symptoms may be signs of a rare, but serious systemic BCG infection. Contact your provider or nurse Urology Clinic ([phone]), on-call urologist ([phone]) during weekends, nights, and holidays,  or go to the emergency room immediately if you experience any of the following symptoms:  Fever higher than 103?F anytime OR Fever higher than 101.3?F that lasts for more than 48 hours  Increase in urinary symptoms and flu-like symptoms lasting more than 48 hours  Signs of liver problems like dark urine, feeling tired, not hungry, upset stomach or stomach pain, light-colored stools, throwing up, or yellow skin  or eyes  Joint pain, skin rash, cough  Extreme fatigue (unable to take care of yourself)    Call 911 immediately if you experience any of the following symptoms:  Shortness of breath  Confusion  Dizziness or lightheadedness

## 2022-04-04 ENCOUNTER — Ambulatory Visit: Payer: Medicare Other | Attending: Urology

## 2022-04-04 VITALS — BP 109/55 | HR 61 | Temp 97.4°F | Resp 16 | Ht 67.0 in

## 2022-04-04 DIAGNOSIS — C642 Malignant neoplasm of left kidney, except renal pelvis: Secondary | ICD-10-CM | POA: Insufficient documentation

## 2022-04-04 DIAGNOSIS — C679 Malignant neoplasm of bladder, unspecified: Secondary | ICD-10-CM | POA: Insufficient documentation

## 2022-04-04 MED ORDER — SODIUM CHLORIDE 0.9 % IJ SOLN (CUSTOM)
25.0000 mg | Freq: Once | INTRAVESICAL | Status: AC
Start: 2022-04-04 — End: 2022-04-04
  Administered 2022-04-04: 25 mg via INTRAVESICAL
  Filled 2022-04-04: qty 25

## 2022-04-04 MED ORDER — LIDOCAINE HCL 2% EX GEL (UROJET)
Status: AC
Start: 2022-04-04 — End: 2022-04-04
  Filled 2022-04-04: qty 10

## 2022-04-04 MED ORDER — LIDOCAINE HCL 2% EX GEL (UROJET)
10.0000 mL | Freq: Once | Status: DC | PRN
Start: 2022-04-04 — End: 2022-04-04
  Administered 2022-04-04: 10 mL via URETHRAL

## 2022-04-04 NOTE — Interdisciplinary (Signed)
Patient arrived ambulatory, alert and without signs or symptoms of distress     Patient presents for 3rd dose of BCG. Patient voided 40 cc urine, no gross hematuria noted.  Patient denies s/s of UTI, dysuria/frequency in urination/flank/suprapubic pain. VSS.      Procedure was explained by nurse and understood by patient.      Area prepped with Betadine in sterile technique. Lidocaine 2% topical jelly instilled into urethra. [place] foley catheter inserted in standard sterile procedure, bladder drained. BCG 25 mg in 50 ml normal saline instilled into bladder. Patient remained in clinic for 2 hours before BCG was drained from catheter. Catheter removed. BCG disposed of per policy.      Procedure well tolerated by the patient.     Information/teaching given to patient including s/s of infection/bleeding/symptom control and when to notify MD, patient verbalized understanding.      Patient discharged to home in stable condition.

## 2022-04-04 NOTE — Patient Instructions (Signed)
BCG Treatment for Bladder Cancer    WHAT IS BCG?  Bacillus Calmette-Guerin (BCG) is a type of bacterium used in bladder cancer to prevent the cancer from coming back after surgery. It is placed into the bladder through a catheter (tube). BCG is also given as a vaccine for tuberculosis (TB) in some countries.    HOW DOES BCG WORK?  When given in the bladder, BCG causes an immune response that brings disease- fighting cells to the bladder. Your immune system can then fight against the cancer cells that are found in the lining of the bladder.    WHAT IS THE USUAL COURSE OF TREATMENT?  Once a week for 6 weeks or once a week for 3 weeks or monthly, as directed by your physician. After your last treatment, you will be scheduled for a cystoscopy in 4-6 weeks.    WHAT DO I NEED TO TELL MY PROVIDER BEFORE I RECEIVE BCG?  If you have ever had an bad reaction to BCG  If you have any of these health problems:  Active TB  Any cancer beside bladder cancer  HIV/AIDS  Weak immune system  Any infection or fever, or if you are taking antibiotics  Blood in the urine  Problems with urinary retention or incontinence  If you are receiving cancer therapy (for example, chemotherapy, radiation) or medications that suppress your immune system (for example, steroids).  If you are taking a diuretic (water pill).  If you had a biopsy, transuretheral resection of bladder tumor (TURBT), or bladder catheterization that caused injury or bleeding within the past two weeks.  If you are pregnant or may become pregnant during treatment. Do not become pregnant while on therapy.  If you are breastfeeding or plan to breastfeed during treatment. We recommend stopping breastfeeding while on BCG.    BEFORE BCG TREATMENT  Do not drink caffeine or alcohol on treatment days.  Purchase household bleach. You will need it at home after treatment to prevent BCG from entering the water system.  Do not drink fluids for four (4) hours before treatment.  Please  bring an absorbent pad or underwear if you normally experience leaks.    WHAT CAN I EXPECT DURING BCG TREATMENT?  BCG will be given in a treatment room at the Urology Clinic or infusion center.  Do not drink fluids for the two (2) hours while BCG is in your bladder.  If you feel any discomfort during treatment, notify your nurse immediately.  Positioning: The bladder is an expandable sac-like organ that contracts when it is empty. The inner lining of the bladder tucks in the folds and expands out to accommodate liquid. For this reason, no turning or lying is necessary. You may position yourself in whatever position you prefer while at home.  At each visit for BCG treatment, you will be asked for a urine sample. If you have signs or symptoms of a bladder infection or blood in the urine, you will not receive BCG that day.  If you are having treatment you will be asked to undress from the waist down and given a sheet to cover yourself.  The nurse will ask you to lie on your back on the bed; they will clean your genital area.  A catheter will be placed into your urethra (tube that carries urine out of your body). This catheter will let any urine drain from your bladder. Once the urine is drained, BCG will be placed into your bladder through the same catheter.    Lidocaine gel, a numbing medication, may be used to make the catheter placement more comfortable.  After BCG is given, the catheter is removed and you will be allowed to go home.  You will hold the BCG solution in your bladder for two (2) hours; you may urinate (pee) sooner if absolutely necessary.  After 2 hours, urinate in your home toilet.  Both men and women should sit on the toilet to urinate to prevent splashing of urine.  Do not flush the toilet! Disinfect urine for 15 minutes with two (2) cups of household bleach before flushing.  Wash your hands thoroughly with warm water and soap.    Follow these same instructions every time you urinate for the first six  (6) hours, and plan to stay home and use the same toilet for those six (6) hours.    AFTER BCG TREATMENT  You may return to your normal activities and social contact. You do not need to restrict your movements and regular routine. You can eat and drink as you normally do.  Drink plenty of fluids after treatment to flush BCG out of your bladder.  If urine contacts your skin, thoroughly wash the area with soap and water.  If any clothes or linens have urine on them, wash separately in a washing machine on the hot cycle with bleach.  If urine splashes or spills on the floor, toilet seat, or other areas, cover the area with bleach-soaked paper towels for fifteen (15) minutes, then clean with soap and water.  If you are sexually active, you or your partner must wear a condom during the treatment course and for one (1) week after treatment has ended.    SIDE EFFECTS  You may feel painful or difficult urination, burning, urgency, frequency or see blood in urine (hematuria).  Flu-like symptoms: Fever, malaise, chills, fatigue, generalized aches and pains    Most patients have mild symptoms of bladder irritation. You may also have flu-like symptoms. Symptoms usually begin 4 to 6 hours after BCG is given and last 2 to 3 days after treatment. Side effects may be worse each time you have a BCG treatment.    The following symptoms may be signs of a rare, but serious systemic BCG infection. Contact your provider or nurse Urology Clinic ([phone]), on-call urologist ([phone]) during weekends, nights, and holidays,  or go to the emergency room immediately if you experience any of the following symptoms:  Fever higher than 103?F anytime OR Fever higher than 101.3?F that lasts for more than 48 hours  Increase in urinary symptoms and flu-like symptoms lasting more than 48 hours  Signs of liver problems like dark urine, feeling tired, not hungry, upset stomach or stomach pain, light-colored stools, throwing up, or yellow skin  or eyes  Joint pain, skin rash, cough  Extreme fatigue (unable to take care of yourself)    Call 911 immediately if you experience any of the following symptoms:  Shortness of breath  Confusion  Dizziness or lightheadedness

## 2022-04-07 ENCOUNTER — Other Ambulatory Visit: Payer: Medicare Other | Attending: Urology

## 2022-04-07 DIAGNOSIS — N289 Disorder of kidney and ureter, unspecified: Secondary | ICD-10-CM | POA: Insufficient documentation

## 2022-04-07 DIAGNOSIS — C662 Malignant neoplasm of left ureter: Secondary | ICD-10-CM | POA: Insufficient documentation

## 2022-04-07 LAB — CBC WITH DIFF, BLOOD
ANC-Automated: 2.7 10*3/uL (ref 1.6–7.0)
Abs Basophils: 0 10*3/uL (ref <0.2)
Abs Eosinophils: 0.1 10*3/uL (ref 0.0–0.5)
Abs Lymphs: 2 10*3/uL (ref 0.8–3.1)
Abs Monos: 0.6 10*3/uL (ref 0.2–0.8)
Basophils: 1 %
Eosinophils: 2 %
Hct: 40.7 % (ref 40.0–50.0)
Hgb: 13.3 gm/dL — ABNORMAL LOW (ref 13.7–17.5)
Lymphocytes: 36 %
MCH: 28.5 pg (ref 26.0–32.0)
MCHC: 32.7 g/dL (ref 32.0–36.0)
MCV: 87.3 um3 (ref 79.0–95.0)
MPV: 10.4 fL (ref 9.4–12.4)
Monocytes: 10 %
Plt Count: 221 10*3/uL (ref 140–370)
RBC: 4.66 10*6/uL (ref 4.60–6.10)
RDW: 15.9 % — ABNORMAL HIGH (ref 12.0–14.0)
Segs: 50 %
WBC: 5.4 10*3/uL (ref 4.0–10.0)

## 2022-04-07 LAB — RENAL FUNCTION PANEL, BLOOD
Albumin: 4.5 g/dL (ref 3.5–5.2)
Anion Gap: 12 mmol/L (ref 7–15)
BUN: 23 mg/dL (ref 8–23)
Bicarbonate: 25 mmol/L (ref 22–29)
Calcium: 9.2 mg/dL (ref 8.5–10.6)
Chloride: 104 mmol/L (ref 98–107)
Creatinine: 1.21 mg/dL — ABNORMAL HIGH (ref 0.67–1.17)
Glucose: 112 mg/dL — ABNORMAL HIGH (ref 70–99)
Phosphorous: 3.5 mg/dL (ref 2.7–4.5)
Potassium: 4.5 mmol/L (ref 3.5–5.1)
Sodium: 141 mmol/L (ref 136–145)
eGFR Based on CKD-EPI 2021 Equation: >60 mL/min/{1.73_m2}

## 2022-04-07 LAB — URINALYSIS WITH CULTURE REFLEX, WHEN INDICATED
Bilirubin: NEGATIVE
Glucose: NEGATIVE
Ketones: NEGATIVE
Leuk Esterase: 500 Leu/uL — AB
Specific Gravity: 1.016 (ref 1.002–1.030)
Urobilinogen: NEGATIVE
WBC: >50 — AB (ref 0–?)
pH: 6 (ref 5.0–8.0)

## 2022-04-07 LAB — RANDOM URINE TP/CR PANEL
Creatinine, Urine: 87 mg/dL (ref 40–278)
TP/CR Ratio Random: 0.61
Total Protein, Urine: 53 mg/dL

## 2022-04-07 LAB — CYSTATIN C, SERUM WITH EGFR
Cystatin C, Serum: 1.25 mg/L — ABNORMAL HIGH (ref 0.61–0.95)
eGFR Based on Cystatin C: 56 mL/min (ref >60)

## 2022-04-09 LAB — URINE CULTURE: Urine Culture Result: >100000 — AB

## 2022-04-11 ENCOUNTER — Other Ambulatory Visit: Payer: Medicare Other

## 2022-04-11 ENCOUNTER — Other Ambulatory Visit: Payer: Self-pay | Admitting: Family Medicine

## 2022-04-11 ENCOUNTER — Encounter (HOSPITAL_BASED_OUTPATIENT_CLINIC_OR_DEPARTMENT_OTHER): Payer: Self-pay | Admitting: Urology

## 2022-04-11 ENCOUNTER — Ambulatory Visit: Payer: Medicare Other | Attending: Urology

## 2022-04-11 ENCOUNTER — Other Ambulatory Visit: Payer: Self-pay

## 2022-04-11 VITALS — BP 124/54 | HR 61 | Temp 97.5°F | Resp 18 | Ht 67.0 in | Wt 191.9 lb

## 2022-04-11 DIAGNOSIS — R7303 Prediabetes: Secondary | ICD-10-CM

## 2022-04-11 DIAGNOSIS — Z125 Encounter for screening for malignant neoplasm of prostate: Secondary | ICD-10-CM

## 2022-04-11 DIAGNOSIS — E7800 Pure hypercholesterolemia, unspecified: Secondary | ICD-10-CM

## 2022-04-11 DIAGNOSIS — C642 Malignant neoplasm of left kidney, except renal pelvis: Secondary | ICD-10-CM

## 2022-04-11 MED ORDER — NITROFURANTOIN MONOHYD MACRO 100 MG OR CAPS
100.0000 mg | ORAL_CAPSULE | Freq: Two times a day (BID) | ORAL | 0 refills | Status: DC
Start: 2022-04-11 — End: 2022-09-15

## 2022-04-11 MED ORDER — SODIUM CHLORIDE 0.9 % IJ SOLN (CUSTOM)
25.0000 mg | Freq: Once | INTRAVESICAL | Status: AC
Start: 2022-04-11 — End: 2022-04-11
  Administered 2022-04-11: 25 mg via INTRAVESICAL
  Filled 2022-04-11: qty 25

## 2022-04-11 MED ORDER — LIDOCAINE HCL 2% EX GEL (UROJET)
10.0000 mL | Freq: Once | Status: DC | PRN
Start: 2022-04-11 — End: 2022-04-11
  Administered 2022-04-11: 10 mL via URETHRAL

## 2022-04-11 MED ORDER — LIDOCAINE HCL 2% EX GEL (UROJET)
Status: AC
Start: 2022-04-11 — End: 2022-04-11
  Filled 2022-04-11: qty 10

## 2022-04-11 NOTE — Result Encounter Note (Signed)
-  No user information entered into Result Notes-

## 2022-04-11 NOTE — Interdisciplinary (Signed)
Called Dr. Sallyanne Kuster to clarify whether or not patient can start BCG due to protocols regarding antibiotic use concurrently with BCG treatment.  Patient does report some discharge from urethra, but urine output is noted in neph bag.  Denies any other symptoms.  Per Dr. Sallyanne Kuster, patient may come back with positive urine culture since he does have neph tube in place (colonized).  It is OK to continue on BCG treatment while starting and completing antibiotic course.  Relayed this information to K. Lam, RN.

## 2022-04-11 NOTE — Patient Instructions (Signed)
BCG Treatment for Bladder Cancer    WHAT IS BCG?  Bacillus Calmette-Guerin (BCG) is a type of bacterium used in bladder cancer to prevent the cancer from coming back after surgery. It is placed into the bladder through a catheter (tube). BCG is also given as a vaccine for tuberculosis (TB) in some countries.    HOW DOES BCG WORK?  When given in the bladder, BCG causes an immune response that brings disease- fighting cells to the bladder. Your immune system can then fight against the cancer cells that are found in the lining of the bladder.    WHAT IS THE USUAL COURSE OF TREATMENT?  Once a week for 6 weeks or once a week for 3 weeks or monthly, as directed by your physician. After your last treatment, you will be scheduled for a cystoscopy in 4-6 weeks.    WHAT DO I NEED TO TELL MY PROVIDER BEFORE I RECEIVE BCG?  If you have ever had an bad reaction to BCG  If you have any of these health problems:  Active TB  Any cancer beside bladder cancer  HIV/AIDS  Weak immune system  Any infection or fever, or if you are taking antibiotics  Blood in the urine  Problems with urinary retention or incontinence  If you are receiving cancer therapy (for example, chemotherapy, radiation) or medications that suppress your immune system (for example, steroids).  If you are taking a diuretic (water pill).  If you had a biopsy, transuretheral resection of bladder tumor (TURBT), or bladder catheterization that caused injury or bleeding within the past two weeks.  If you are pregnant or may become pregnant during treatment. Do not become pregnant while on therapy.  If you are breastfeeding or plan to breastfeed during treatment. We recommend stopping breastfeeding while on BCG.    BEFORE BCG TREATMENT  Do not drink caffeine or alcohol on treatment days.  Purchase household bleach. You will need it at home after treatment to prevent BCG from entering the water system.  Do not drink fluids for four (4) hours before treatment.  Please  bring an absorbent pad or underwear if you normally experience leaks.    WHAT CAN I EXPECT DURING BCG TREATMENT?  BCG will be given in a treatment room at the Urology Clinic or infusion center.  Do not drink fluids for the two (2) hours while BCG is in your bladder.  If you feel any discomfort during treatment, notify your nurse immediately.  Positioning: The bladder is an expandable sac-like organ that contracts when it is empty. The inner lining of the bladder tucks in the folds and expands out to accommodate liquid. For this reason, no turning or lying is necessary. You may position yourself in whatever position you prefer while at home.  At each visit for BCG treatment, you will be asked for a urine sample. If you have signs or symptoms of a bladder infection or blood in the urine, you will not receive BCG that day.  If you are having treatment you will be asked to undress from the waist down and given a sheet to cover yourself.  The nurse will ask you to lie on your back on the bed; they will clean your genital area.  A catheter will be placed into your urethra (tube that carries urine out of your body). This catheter will let any urine drain from your bladder. Once the urine is drained, BCG will be placed into your bladder through the same catheter.    Lidocaine gel, a numbing medication, may be used to make the catheter placement more comfortable.  After BCG is given, the catheter is removed and you will be allowed to go home.  You will hold the BCG solution in your bladder for two (2) hours; you may urinate (pee) sooner if absolutely necessary.  After 2 hours, urinate in your home toilet.  Both men and women should sit on the toilet to urinate to prevent splashing of urine.  Do not flush the toilet! Disinfect urine for 15 minutes with two (2) cups of household bleach before flushing.  Wash your hands thoroughly with warm water and soap.    Follow these same instructions every time you urinate for the first six  (6) hours, and plan to stay home and use the same toilet for those six (6) hours.    AFTER BCG TREATMENT  You may return to your normal activities and social contact. You do not need to restrict your movements and regular routine. You can eat and drink as you normally do.  Drink plenty of fluids after treatment to flush BCG out of your bladder.  If urine contacts your skin, thoroughly wash the area with soap and water.  If any clothes or linens have urine on them, wash separately in a washing machine on the hot cycle with bleach.  If urine splashes or spills on the floor, toilet seat, or other areas, cover the area with bleach-soaked paper towels for fifteen (15) minutes, then clean with soap and water.  If you are sexually active, you or your partner must wear a condom during the treatment course and for one (1) week after treatment has ended.    SIDE EFFECTS  You may feel painful or difficult urination, burning, urgency, frequency or see blood in urine (hematuria).  Flu-like symptoms: Fever, malaise, chills, fatigue, generalized aches and pains    Most patients have mild symptoms of bladder irritation. You may also have flu-like symptoms. Symptoms usually begin 4 to 6 hours after BCG is given and last 2 to 3 days after treatment. Side effects may be worse each time you have a BCG treatment.    The following symptoms may be signs of a rare, but serious systemic BCG infection. Contact your provider or nurse Urology Clinic ([phone]), on-call urologist ([phone]) during weekends, nights, and holidays,  or go to the emergency room immediately if you experience any of the following symptoms:  Fever higher than 103?F anytime OR Fever higher than 101.3?F that lasts for more than 48 hours  Increase in urinary symptoms and flu-like symptoms lasting more than 48 hours  Signs of liver problems like dark urine, feeling tired, not hungry, upset stomach or stomach pain, light-colored stools, throwing up, or yellow skin  or eyes  Joint pain, skin rash, cough  Extreme fatigue (unable to take care of yourself)    Call 911 immediately if you experience any of the following symptoms:  Shortness of breath  Confusion  Dizziness or lightheadedness

## 2022-04-11 NOTE — Result Encounter Note (Signed)
Patient is here for BCG, confirmed with Dr. Sallyanne Kuster it is OK for patient to be on ABT and start BCG today.  Patient is aware of plan.

## 2022-04-11 NOTE — Interdisciplinary (Signed)
Patient arrived ambulatory, alert and without signs or symptoms of distress     Patient presents for 4th dose of BCG. Patient voided 10 cc urine, no gross hematuria noted.  Patient denies s/s of UTI, dysuria/frequency in urination/flank/suprapubic pain. VSS. Patient noted that he was started on an antibiotic per Dr. Sallyanne Kuster. He plans to start it 4/25 as he has not picked up the medication yet. Reviewed with Dr. Sallyanne Kuster and was given the ok to proceed with BCG while patient is on antibiotics concurrently.      Procedure was explained by nurse and understood by patient.      Area prepped with Betadine in sterile technique. Lidocaine 2% topical jelly instilled into urethra. [place] foley catheter inserted in standard sterile procedure, bladder drained. BCG 25 mg in 50 ml normal saline instilled into bladder. BCG dwelled in bladder for 1 hour 45 minutes before patient went to bathroom. He passed gas and strained and some BCG came out from around urethra. Bladder drained with minimal output and catheter was removed. BCG disposed of per policy.     Procedure well tolerated by the patient.     Information/teaching given to patient including s/s of infection/bleeding/symptom control and when to notify MD, patient verbalized understanding.      Patient discharged to home in stable condition.

## 2022-04-12 ENCOUNTER — Other Ambulatory Visit (INDEPENDENT_AMBULATORY_CARE_PROVIDER_SITE_OTHER): Payer: Medicare Other

## 2022-04-12 ENCOUNTER — Encounter (HOSPITAL_BASED_OUTPATIENT_CLINIC_OR_DEPARTMENT_OTHER): Payer: Self-pay | Admitting: Orthopaedic Surgery of the Spine

## 2022-04-12 DIAGNOSIS — E7800 Pure hypercholesterolemia, unspecified: Secondary | ICD-10-CM

## 2022-04-12 DIAGNOSIS — Z125 Encounter for screening for malignant neoplasm of prostate: Secondary | ICD-10-CM

## 2022-04-12 DIAGNOSIS — R7303 Prediabetes: Secondary | ICD-10-CM | POA: Diagnosis not present

## 2022-04-12 DIAGNOSIS — Z981 Arthrodesis status: Secondary | ICD-10-CM

## 2022-04-14 ENCOUNTER — Other Ambulatory Visit: Payer: Self-pay | Admitting: Cardiovascular Disease

## 2022-04-15 ENCOUNTER — Encounter: Payer: Medicare Other | Admitting: Family Medicine

## 2022-04-15 ENCOUNTER — Ambulatory Visit (INDEPENDENT_AMBULATORY_CARE_PROVIDER_SITE_OTHER): Payer: Medicare Other | Admitting: Nephrology

## 2022-04-15 VITALS — BP 108/67 | HR 48 | Ht 67.0 in | Wt 192.9 lb

## 2022-04-15 DIAGNOSIS — N179 Acute kidney failure, unspecified: Secondary | ICD-10-CM

## 2022-04-15 DIAGNOSIS — I10 Essential (primary) hypertension: Secondary | ICD-10-CM

## 2022-04-15 DIAGNOSIS — N183 Chronic kidney disease, stage 3 unspecified (CMS-HCC): Secondary | ICD-10-CM

## 2022-04-15 DIAGNOSIS — C642 Malignant neoplasm of left kidney, except renal pelvis: Secondary | ICD-10-CM

## 2022-04-15 DIAGNOSIS — N289 Disorder of kidney and ureter, unspecified: Secondary | ICD-10-CM

## 2022-04-15 NOTE — Addendum Note (Signed)
Addended by: Ellamae Sia on: 04/15/2022 11:03 AM ? ? Modules accepted: Orders ? ?

## 2022-04-18 ENCOUNTER — Telehealth (HOSPITAL_BASED_OUTPATIENT_CLINIC_OR_DEPARTMENT_OTHER): Payer: Self-pay

## 2022-04-18 ENCOUNTER — Ambulatory Visit (HOSPITAL_BASED_OUTPATIENT_CLINIC_OR_DEPARTMENT_OTHER): Payer: Self-pay

## 2022-04-18 ENCOUNTER — Ambulatory Visit (HOSPITAL_BASED_OUTPATIENT_CLINIC_OR_DEPARTMENT_OTHER): Admit: 2022-04-18 | Discharge: 2022-04-18 | Disposition: A | Discharge status: Home Health | Payer: Medicare Other

## 2022-04-18 ENCOUNTER — Ambulatory Visit: Payer: Medicare Other | Attending: Orthopaedic Surgery of the Spine | Admitting: Orthopaedic Surgery of the Spine

## 2022-04-18 VITALS — BP 122/64 | HR 74 | Temp 97.9°F | Resp 16 | Ht 67.0 in | Wt 192.5 lb

## 2022-04-18 DIAGNOSIS — M25512 Pain in left shoulder: Secondary | ICD-10-CM

## 2022-04-18 DIAGNOSIS — C642 Malignant neoplasm of left kidney, except renal pelvis: Secondary | ICD-10-CM | POA: Insufficient documentation

## 2022-04-18 DIAGNOSIS — Z981 Arthrodesis status: Secondary | ICD-10-CM | POA: Insufficient documentation

## 2022-04-18 DIAGNOSIS — M4317 Spondylolisthesis, lumbosacral region: Secondary | ICD-10-CM

## 2022-04-18 DIAGNOSIS — M542 Cervicalgia: Secondary | ICD-10-CM | POA: Insufficient documentation

## 2022-04-18 DIAGNOSIS — M5412 Radiculopathy, cervical region: Secondary | ICD-10-CM | POA: Insufficient documentation

## 2022-04-18 DIAGNOSIS — G8929 Other chronic pain: Secondary | ICD-10-CM | POA: Insufficient documentation

## 2022-04-18 MED ORDER — LIDOCAINE HCL 2% EX GEL (UROJET)
10.0000 mL | Freq: Once | Status: DC | PRN
Start: 2022-04-18 — End: 2022-04-18
  Administered 2022-04-18: 10 mL via URETHRAL

## 2022-04-18 MED ORDER — LIDOCAINE HCL 2% EX GEL (UROJET)
Status: AC
Start: 2022-04-18 — End: 2022-04-18
  Filled 2022-04-18: qty 10

## 2022-04-18 MED ORDER — SODIUM CHLORIDE 0.9 % IJ SOLN (CUSTOM)
25.0000 mg | Freq: Once | INTRAVESICAL | Status: AC
Start: 2022-04-18 — End: 2022-04-18
  Administered 2022-04-18: 25 mg via INTRAVESICAL
  Filled 2022-04-18: qty 25

## 2022-04-18 NOTE — Patient Instructions (Signed)
BCG Treatment for Bladder Cancer    WHAT IS BCG?  Bacillus Calmette-Guerin (BCG) is a type of bacterium used in bladder cancer to prevent the cancer from coming back after surgery. It is placed into the bladder through a catheter (tube). BCG is also given as a vaccine for tuberculosis (TB) in some countries.    HOW DOES BCG WORK?  When given in the bladder, BCG causes an immune response that brings disease- fighting cells to the bladder. Your immune system can then fight against the cancer cells that are found in the lining of the bladder.    WHAT IS THE USUAL COURSE OF TREATMENT?  Once a week for 6 weeks or once a week for 3 weeks or monthly, as directed by your physician. After your last treatment, you will be scheduled for a cystoscopy in 4-6 weeks.    WHAT DO I NEED TO TELL MY PROVIDER BEFORE I RECEIVE BCG?  If you have ever had an bad reaction to BCG  If you have any of these health problems:  Active TB  Any cancer beside bladder cancer  HIV/AIDS  Weak immune system  Any infection or fever, or if you are taking antibiotics  Blood in the urine  Problems with urinary retention or incontinence  If you are receiving cancer therapy (for example, chemotherapy, radiation) or medications that suppress your immune system (for example, steroids).  If you are taking a diuretic (water pill).  If you had a biopsy, transuretheral resection of bladder tumor (TURBT), or bladder catheterization that caused injury or bleeding within the past two weeks.  If you are pregnant or may become pregnant during treatment. Do not become pregnant while on therapy.  If you are breastfeeding or plan to breastfeed during treatment. We recommend stopping breastfeeding while on BCG.    BEFORE BCG TREATMENT  Do not drink caffeine or alcohol on treatment days.  Purchase household bleach. You will need it at home after treatment to prevent BCG from entering the water system.  Do not drink fluids for four (4) hours before treatment.  Please  bring an absorbent pad or underwear if you normally experience leaks.    WHAT CAN I EXPECT DURING BCG TREATMENT?  BCG will be given in a treatment room at the Urology Clinic or infusion center.  Do not drink fluids for the two (2) hours while BCG is in your bladder.  If you feel any discomfort during treatment, notify your nurse immediately.  Positioning: The bladder is an expandable sac-like organ that contracts when it is empty. The inner lining of the bladder tucks in the folds and expands out to accommodate liquid. For this reason, no turning or lying is necessary. You may position yourself in whatever position you prefer while at home.  At each visit for BCG treatment, you will be asked for a urine sample. If you have signs or symptoms of a bladder infection or blood in the urine, you will not receive BCG that day.  If you are having treatment you will be asked to undress from the waist down and given a sheet to cover yourself.  The nurse will ask you to lie on your back on the bed; they will clean your genital area.  A catheter will be placed into your urethra (tube that carries urine out of your body). This catheter will let any urine drain from your bladder. Once the urine is drained, BCG will be placed into your bladder through the same catheter.    Lidocaine gel, a numbing medication, may be used to make the catheter placement more comfortable.  After BCG is given, the catheter is removed and you will be allowed to go home.  You will hold the BCG solution in your bladder for two (2) hours; you may urinate (pee) sooner if absolutely necessary.  After 2 hours, urinate in your home toilet.  Both men and women should sit on the toilet to urinate to prevent splashing of urine.  Do not flush the toilet! Disinfect urine for 15 minutes with two (2) cups of household bleach before flushing.  Wash your hands thoroughly with warm water and soap.    Follow these same instructions every time you urinate for the first six  (6) hours, and plan to stay home and use the same toilet for those six (6) hours.    AFTER BCG TREATMENT  You may return to your normal activities and social contact. You do not need to restrict your movements and regular routine. You can eat and drink as you normally do.  Drink plenty of fluids after treatment to flush BCG out of your bladder.  If urine contacts your skin, thoroughly wash the area with soap and water.  If any clothes or linens have urine on them, wash separately in a washing machine on the hot cycle with bleach.  If urine splashes or spills on the floor, toilet seat, or other areas, cover the area with bleach-soaked paper towels for fifteen (15) minutes, then clean with soap and water.  If you are sexually active, you or your partner must wear a condom during the treatment course and for one (1) week after treatment has ended.    SIDE EFFECTS  You may feel painful or difficult urination, burning, urgency, frequency or see blood in urine (hematuria).  Flu-like symptoms: Fever, malaise, chills, fatigue, generalized aches and pains    Most patients have mild symptoms of bladder irritation. You may also have flu-like symptoms. Symptoms usually begin 4 to 6 hours after BCG is given and last 2 to 3 days after treatment. Side effects may be worse each time you have a BCG treatment.    The following symptoms may be signs of a rare, but serious systemic BCG infection. Contact your provider or nurse Urology Clinic ([phone]), on-call urologist ([phone]) during weekends, nights, and holidays,  or go to the emergency room immediately if you experience any of the following symptoms:  Fever higher than 103?F anytime OR Fever higher than 101.3?F that lasts for more than 48 hours  Increase in urinary symptoms and flu-like symptoms lasting more than 48 hours  Signs of liver problems like dark urine, feeling tired, not hungry, upset stomach or stomach pain, light-colored stools, throwing up, or yellow skin  or eyes  Joint pain, skin rash, cough  Extreme fatigue (unable to take care of yourself)    Call 911 immediately if you experience any of the following symptoms:  Shortness of breath  Confusion  Dizziness or lightheadedness

## 2022-04-18 NOTE — Patient Instructions (Signed)
_____________________________________________________________________    Hello, my name is Kindrick Lankford. I am the Medical Assistant who helped you out today.     It was a pleasure assisting you today. If you have any questions please feel free to call us at [phone].     We are committed to making sure our patient receive the best care. Satisfaction surveys are now randomly being sent out to patients after their visits. These surveys will be received via text, e-mail and or in the mail.  If you happen to get one, we'd greatly appreciate you taking a few minutes to fill it out to let us know what you like best about your visit and if there is anything we can improve. We use this information to be sure you are receiving the best care, as well as to recognize staff members who do a great job.    Thank you for using [facility] for your healthcare needs. Have a great day!

## 2022-04-18 NOTE — Interdisciplinary (Signed)
Patient arrived ambulatory, alert and without signs or symptoms of distress     Patient presents for 5th dose of BCG. Patient voided 40 cc urine, no gross hematuria noted.  Patient denies s/s of UTI, dysuria/frequency in urination/flank/suprapubic pain. VSS.      Procedure was explained by nurse and understood by patient.       Area prepped with Betadine in sterile technique. Lidocaine 2% topical jelly instilled into urethra. [place] foley catheter inserted in standard sterile procedure, bladder drained. BCG 25 mg in 50 ml normal saline instilled into bladder.Catheter capped for 2 hours and then bladder drained. Catheter removed. BCG disposed of per policy.      Procedure well tolerated by the patient.     Information/teaching given to patient including s/s of infection/bleeding/symptom control and when to notify MD, patient verbalized understanding.      Patient discharged to home in stable condition.

## 2022-04-18 NOTE — Telephone Encounter (Signed)
Left Message to schedule neph tube check

## 2022-04-19 ENCOUNTER — Other Ambulatory Visit (INDEPENDENT_AMBULATORY_CARE_PROVIDER_SITE_OTHER): Payer: Self-pay | Admitting: Vascular & Interventional Radiology

## 2022-04-19 ENCOUNTER — Other Ambulatory Visit (HOSPITAL_BASED_OUTPATIENT_CLINIC_OR_DEPARTMENT_OTHER): Payer: Self-pay | Admitting: Nurse Practitioner

## 2022-04-19 DIAGNOSIS — Z981 Arthrodesis status: Secondary | ICD-10-CM

## 2022-04-19 LAB — EMMI , ANESTHESIA (ADULT): EMMI Video Order Number: 16219710296

## 2022-04-19 LAB — EMMI , PATIENT SATISFACTION: HOSPITAL DISCHARGE EXPECTATIONS: EMMI Video Order Number: 17726928835

## 2022-04-19 LAB — EMMI,  PAIN MANAGEMENT: ACUTE IN HOSPITAL: EMMI Video Order Number: 13846411935

## 2022-04-19 MED ORDER — PREGABALIN 100 MG OR CAPS
ORAL_CAPSULE | ORAL | 0 refills | Status: AC
Start: 2022-04-19 — End: ?

## 2022-04-21 ENCOUNTER — Other Ambulatory Visit (INDEPENDENT_AMBULATORY_CARE_PROVIDER_SITE_OTHER): Payer: Medicare Other

## 2022-04-21 DIAGNOSIS — R7303 Prediabetes: Secondary | ICD-10-CM | POA: Diagnosis not present

## 2022-04-21 DIAGNOSIS — E7800 Pure hypercholesterolemia, unspecified: Secondary | ICD-10-CM

## 2022-04-21 DIAGNOSIS — Z125 Encounter for screening for malignant neoplasm of prostate: Secondary | ICD-10-CM

## 2022-04-21 LAB — COMPREHENSIVE METABOLIC PANEL
ALT: 16 U/L (ref 0–53)
AST: 18 U/L (ref 0–37)
Albumin: 4.1 g/dL (ref 3.5–5.2)
Alkaline Phosphatase: 86 U/L (ref 39–117)
BUN: 14 mg/dL (ref 6–23)
CO2: 31 mEq/L (ref 19–32)
Calcium: 9.2 mg/dL (ref 8.4–10.5)
Chloride: 103 mEq/L (ref 96–112)
Creatinine, Ser: 1.12 mg/dL (ref 0.40–1.50)
GFR: 67.54 mL/min (ref >60.00)
Glucose, Bld: 95 mg/dL (ref 70–99)
Potassium: 4.5 mEq/L (ref 3.5–5.1)
Sodium: 142 mEq/L (ref 135–145)
Total Bilirubin: 0.9 mg/dL (ref 0.2–1.2)
Total Protein: 7 g/dL (ref 6.0–8.3)

## 2022-04-21 LAB — LIPID PANEL
Cholesterol: 145 mg/dL (ref 0–200)
HDL: 57.7 mg/dL (ref >39.00)
LDL Cholesterol: 74 mg/dL (ref 0–99)
NonHDL: 87.52
Total CHOL/HDL Ratio: 3
Triglycerides: 70 mg/dL (ref 0.0–149.0)
VLDL: 14 mg/dL (ref 0.0–40.0)

## 2022-04-21 LAB — PSA, MEDICARE: PSA: 3 ng/ml (ref 0.10–4.00)

## 2022-04-21 LAB — HEMOGLOBIN A1C: Hgb A1c MFr Bld: 5.7 % (ref 4.6–6.5)

## 2022-04-25 ENCOUNTER — Other Ambulatory Visit: Payer: Medicare Other

## 2022-04-25 ENCOUNTER — Encounter (HOSPITAL_BASED_OUTPATIENT_CLINIC_OR_DEPARTMENT_OTHER): Payer: Self-pay | Admitting: Orthopaedic Surgery of the Spine

## 2022-04-25 ENCOUNTER — Ambulatory Visit: Payer: Medicare Other | Attending: Urology

## 2022-04-25 ENCOUNTER — Telehealth (HOSPITAL_BASED_OUTPATIENT_CLINIC_OR_DEPARTMENT_OTHER): Payer: Self-pay | Admitting: Urology

## 2022-04-25 VITALS — BP 131/64 | HR 60 | Temp 97.9°F | Resp 16 | Ht 67.0 in | Wt 193.8 lb

## 2022-04-25 DIAGNOSIS — F419 Anxiety disorder, unspecified: Secondary | ICD-10-CM

## 2022-04-25 DIAGNOSIS — C642 Malignant neoplasm of left kidney, except renal pelvis: Secondary | ICD-10-CM | POA: Insufficient documentation

## 2022-04-25 MED ORDER — SODIUM CHLORIDE 0.9 % IJ SOLN (CUSTOM)
25.0000 mg | Freq: Once | INTRAVESICAL | Status: AC
Start: 2022-04-25 — End: 2022-04-25
  Administered 2022-04-25: 25 mg via INTRAVESICAL
  Filled 2022-04-25: qty 25

## 2022-04-25 MED ORDER — LIDOCAINE HCL 2% EX GEL (UROJET)
Status: AC
Start: 2022-04-25 — End: 2022-04-25
  Filled 2022-04-25: qty 10

## 2022-04-25 MED ORDER — LIDOCAINE HCL 2% EX GEL (UROJET)
10.0000 mL | Freq: Once | Status: DC | PRN
Start: 2022-04-25 — End: 2022-04-25
  Administered 2022-04-25: 10 mL via URETHRAL

## 2022-04-25 NOTE — Interdisciplinary (Signed)
Patient arrived ambulatory, alert and without signs or symptoms of distress     Patient presents for 6th dose of BCG. Patient voided 30 cc urine, no gross hematuria noted.  Patient denies s/s of UTI, dysuria/frequency in urination/flank/suprapubic pain. VSS.      Procedure was explained by nurse and understood by patient.      Area prepped with Betadine in sterile technique. Lidocaine 2% topical jelly instilled into urethra. [place] foley catheter inserted in standard sterile procedure, bladder drained. BCG 25 mg in 50 ml normal saline instilled into bladder. BCG dwelled for 2 hours. Bladder drained after 2 hours and BCG disposed of per policy. Catheter removed.     Procedure well tolerated by the patient.     Information/teaching given to patient including s/s of infection/bleeding/symptom control and when to notify MD, patient verbalized understanding.      Patient discharged to home in stable condition.

## 2022-04-25 NOTE — Telephone Encounter (Signed)
HI Vicente Males,  Please advise  PT's last BCG today 5/8. He has a f/u w/ Dr. Sallyanne Kuster on 5/16. Does he need cysto 6-8wks?

## 2022-04-25 NOTE — Patient Instructions (Signed)
BCG Treatment for Bladder Cancer    WHAT IS BCG?  Bacillus Calmette-Guerin (BCG) is a type of bacterium used in bladder cancer to prevent the cancer from coming back after surgery. It is placed into the bladder through a catheter (tube). BCG is also given as a vaccine for tuberculosis (TB) in some countries.    HOW DOES BCG WORK?  When given in the bladder, BCG causes an immune response that brings disease- fighting cells to the bladder. Your immune system can then fight against the cancer cells that are found in the lining of the bladder.    WHAT IS THE USUAL COURSE OF TREATMENT?  Once a week for 6 weeks or once a week for 3 weeks or monthly, as directed by your physician. After your last treatment, you will be scheduled for a cystoscopy in 4-6 weeks.    WHAT DO I NEED TO TELL MY PROVIDER BEFORE I RECEIVE BCG?  If you have ever had an bad reaction to BCG  If you have any of these health problems:  Active TB  Any cancer beside bladder cancer  HIV/AIDS  Weak immune system  Any infection or fever, or if you are taking antibiotics  Blood in the urine  Problems with urinary retention or incontinence  If you are receiving cancer therapy (for example, chemotherapy, radiation) or medications that suppress your immune system (for example, steroids).  If you are taking a diuretic (water pill).  If you had a biopsy, transuretheral resection of bladder tumor (TURBT), or bladder catheterization that caused injury or bleeding within the past two weeks.  If you are pregnant or may become pregnant during treatment. Do not become pregnant while on therapy.  If you are breastfeeding or plan to breastfeed during treatment. We recommend stopping breastfeeding while on BCG.    BEFORE BCG TREATMENT  Do not drink caffeine or alcohol on treatment days.  Purchase household bleach. You will need it at home after treatment to prevent BCG from entering the water system.  Do not drink fluids for four (4) hours before treatment.  Please  bring an absorbent pad or underwear if you normally experience leaks.    WHAT CAN I EXPECT DURING BCG TREATMENT?  BCG will be given in a treatment room at the Urology Clinic or infusion center.  Do not drink fluids for the two (2) hours while BCG is in your bladder.  If you feel any discomfort during treatment, notify your nurse immediately.  Positioning: The bladder is an expandable sac-like organ that contracts when it is empty. The inner lining of the bladder tucks in the folds and expands out to accommodate liquid. For this reason, no turning or lying is necessary. You may position yourself in whatever position you prefer while at home.  At each visit for BCG treatment, you will be asked for a urine sample. If you have signs or symptoms of a bladder infection or blood in the urine, you will not receive BCG that day.  If you are having treatment you will be asked to undress from the waist down and given a sheet to cover yourself.  The nurse will ask you to lie on your back on the bed; they will clean your genital area.  A catheter will be placed into your urethra (tube that carries urine out of your body). This catheter will let any urine drain from your bladder. Once the urine is drained, BCG will be placed into your bladder through the same catheter.    Lidocaine gel, a numbing medication, may be used to make the catheter placement more comfortable.  After BCG is given, the catheter is removed and you will be allowed to go home.  You will hold the BCG solution in your bladder for two (2) hours; you may urinate (pee) sooner if absolutely necessary.  After 2 hours, urinate in your home toilet.  Both men and women should sit on the toilet to urinate to prevent splashing of urine.  Do not flush the toilet! Disinfect urine for 15 minutes with two (2) cups of household bleach before flushing.  Wash your hands thoroughly with warm water and soap.    Follow these same instructions every time you urinate for the first six  (6) hours, and plan to stay home and use the same toilet for those six (6) hours.    AFTER BCG TREATMENT  You may return to your normal activities and social contact. You do not need to restrict your movements and regular routine. You can eat and drink as you normally do.  Drink plenty of fluids after treatment to flush BCG out of your bladder.  If urine contacts your skin, thoroughly wash the area with soap and water.  If any clothes or linens have urine on them, wash separately in a washing machine on the hot cycle with bleach.  If urine splashes or spills on the floor, toilet seat, or other areas, cover the area with bleach-soaked paper towels for fifteen (15) minutes, then clean with soap and water.  If you are sexually active, you or your partner must wear a condom during the treatment course and for one (1) week after treatment has ended.    SIDE EFFECTS  You may feel painful or difficult urination, burning, urgency, frequency or see blood in urine (hematuria).  Flu-like symptoms: Fever, malaise, chills, fatigue, generalized aches and pains    Most patients have mild symptoms of bladder irritation. You may also have flu-like symptoms. Symptoms usually begin 4 to 6 hours after BCG is given and last 2 to 3 days after treatment. Side effects may be worse each time you have a BCG treatment.    The following symptoms may be signs of a rare, but serious systemic BCG infection. Contact your provider or nurse Urology Clinic ([phone]), on-call urologist ([phone]) during weekends, nights, and holidays,  or go to the emergency room immediately if you experience any of the following symptoms:  Fever higher than 103?F anytime OR Fever higher than 101.3?F that lasts for more than 48 hours  Increase in urinary symptoms and flu-like symptoms lasting more than 48 hours  Signs of liver problems like dark urine, feeling tired, not hungry, upset stomach or stomach pain, light-colored stools, throwing up, or yellow skin  or eyes  Joint pain, skin rash, cough  Extreme fatigue (unable to take care of yourself)    Call 911 immediately if you experience any of the following symptoms:  Shortness of breath  Confusion  Dizziness or lightheadedness

## 2022-04-27 NOTE — Telephone Encounter (Signed)
From: Lovena Le  To: [name], MD  Sent: 05/26/3418 4:23 PM PDT  Subject: medication for MRI    Please order me something to relax me during my MRI May 19 at [facility] thank you

## 2022-04-27 NOTE — Telephone Encounter (Signed)
Routed to rn team..  Please tee up medication for mri anxiety

## 2022-04-27 NOTE — Telephone Encounter (Signed)
Per LOV 02/22/22, patient is to follow up.  OK to make [place] if transportation is an issue.

## 2022-04-28 ENCOUNTER — Telehealth (HOSPITAL_BASED_OUTPATIENT_CLINIC_OR_DEPARTMENT_OTHER): Payer: Self-pay | Admitting: Nurse Practitioner

## 2022-04-28 DIAGNOSIS — F419 Anxiety disorder, unspecified: Secondary | ICD-10-CM

## 2022-04-28 MED ORDER — LORAZEPAM 0.5 MG OR TABS
ORAL_TABLET | ORAL | 0 refills | Status: DC
Start: 2022-04-28 — End: 2022-04-28

## 2022-04-28 NOTE — Telephone Encounter (Signed)
Sent rx for Ativan since Valium did not help last time.

## 2022-04-28 NOTE — Telephone Encounter (Signed)
Elena from [facility] calling to state they do not carry LORazepam (ATIVAN) 0.5 MG  Place 1 tab Sublingual 30 minutes prior to procedure, may repeat once, would like to know if patient can be given tablet.         [id number] [place] [id number]  Phone:  [phone]:  [phone]    Please assist, thank you.

## 2022-04-28 NOTE — Telephone Encounter (Signed)
Pt noted he has tried Valium in the past, one tab only and it didn't "really do anything". Advised we can try two tablets

## 2022-04-29 ENCOUNTER — Ambulatory Visit (INDEPENDENT_AMBULATORY_CARE_PROVIDER_SITE_OTHER): Payer: Medicare Other | Admitting: Family Medicine

## 2022-04-29 ENCOUNTER — Other Ambulatory Visit: Payer: Self-pay | Admitting: Cardiovascular Disease

## 2022-04-29 ENCOUNTER — Encounter: Payer: Self-pay | Admitting: Family Medicine

## 2022-04-29 VITALS — BP 120/66 | HR 53 | Temp 97.6°F | Ht 67.5 in | Wt 220.0 lb

## 2022-04-29 DIAGNOSIS — E669 Obesity, unspecified: Secondary | ICD-10-CM

## 2022-04-29 DIAGNOSIS — Z23 Encounter for immunization: Secondary | ICD-10-CM | POA: Diagnosis not present

## 2022-04-29 DIAGNOSIS — I7143 Infrarenal abdominal aortic aneurysm, without rupture: Secondary | ICD-10-CM

## 2022-04-29 DIAGNOSIS — K760 Fatty (change of) liver, not elsewhere classified: Secondary | ICD-10-CM

## 2022-04-29 DIAGNOSIS — N529 Male erectile dysfunction, unspecified: Secondary | ICD-10-CM

## 2022-04-29 DIAGNOSIS — Z720 Tobacco use: Secondary | ICD-10-CM

## 2022-04-29 DIAGNOSIS — Z0000 Encounter for general adult medical examination without abnormal findings: Secondary | ICD-10-CM | POA: Diagnosis not present

## 2022-04-29 DIAGNOSIS — Z7189 Other specified counseling: Secondary | ICD-10-CM

## 2022-04-29 DIAGNOSIS — I700 Atherosclerosis of aorta: Secondary | ICD-10-CM

## 2022-04-29 DIAGNOSIS — Z87891 Personal history of nicotine dependence: Secondary | ICD-10-CM | POA: Diagnosis not present

## 2022-04-29 DIAGNOSIS — I714 Abdominal aortic aneurysm, without rupture, unspecified: Secondary | ICD-10-CM

## 2022-04-29 DIAGNOSIS — R7303 Prediabetes: Secondary | ICD-10-CM

## 2022-04-29 DIAGNOSIS — J432 Centrilobular emphysema: Secondary | ICD-10-CM

## 2022-04-29 DIAGNOSIS — E7800 Pure hypercholesterolemia, unspecified: Secondary | ICD-10-CM

## 2022-04-29 DIAGNOSIS — I359 Nonrheumatic aortic valve disorder, unspecified: Secondary | ICD-10-CM

## 2022-04-29 MED ORDER — LORAZEPAM 1 MG OR TABS
ORAL_TABLET | ORAL | 0 refills | Status: DC
Start: 2022-04-29 — End: 2022-09-15

## 2022-04-29 NOTE — Assessment & Plan Note (Signed)
Advanced directives- brought and scanned 02/2021. Wife Diane and son Rasool Rommel are HCPOA. Does not want prolonged life support if terminal condition. Does want both artificial hydration and nutrition however. HCPOA can override advance directive.  ?

## 2022-04-29 NOTE — Assessment & Plan Note (Signed)

## 2022-04-29 NOTE — Patient Instructions (Addendum)
Pneumovax-23 today.  ?You are doing well today. Continue current medicines. ?Return as needed or in 1 year for next wellness visit.  ? ?Health Maintenance After Age 69 ?After age 97, you are at a higher risk for certain long-term diseases and infections as well as injuries from falls. Falls are a major cause of broken bones and head injuries in people who are older than age 107. Getting regular preventive care can help to keep you healthy and well. Preventive care includes getting regular testing and making lifestyle changes as recommended by your health care provider. Talk with your health care provider about: ?Which screenings and tests you should have. A screening is a test that checks for a disease when you have no symptoms. ?A diet and exercise plan that is right for you. ?What should I know about screenings and tests to prevent falls? ?Screening and testing are the best ways to find a health problem early. Early diagnosis and treatment give you the best chance of managing medical conditions that are common after age 37. Certain conditions and lifestyle choices may make you more likely to have a fall. Your health care provider may recommend: ?Regular vision checks. Poor vision and conditions such as cataracts can make you more likely to have a fall. If you wear glasses, make sure to get your prescription updated if your vision changes. ?Medicine review. Work with your health care provider to regularly review all of the medicines you are taking, including over-the-counter medicines. Ask your health care provider about any side effects that may make you more likely to have a fall. Tell your health care provider if any medicines that you take make you feel dizzy or sleepy. ?Strength and balance checks. Your health care provider may recommend certain tests to check your strength and balance while standing, walking, or changing positions. ?Foot health exam. Foot pain and numbness, as well as not wearing proper  footwear, can make you more likely to have a fall. ?Screenings, including: ?Osteoporosis screening. Osteoporosis is a condition that causes the bones to get weaker and break more easily. ?Blood pressure screening. Blood pressure changes and medicines to control blood pressure can make you feel dizzy. ?Depression screening. You may be more likely to have a fall if you have a fear of falling, feel depressed, or feel unable to do activities that you used to do. ?Alcohol use screening. Using too much alcohol can affect your balance and may make you more likely to have a fall. ?Follow these instructions at home: ?Lifestyle ?Do not drink alcohol if: ?Your health care provider tells you not to drink. ?If you drink alcohol: ?Limit how much you have to: ?0-1 drink a day for women. ?0-2 drinks a day for men. ?Know how much alcohol is in your drink. In the [place] one drink equals one 12 oz bottle of beer (355 mL), one 5 oz glass of wine (148 mL), or one 1? oz glass of hard liquor (44 mL). ?Do not use any products that contain nicotine or tobacco. These products include cigarettes, chewing tobacco, and vaping devices, such as e-cigarettes. If you need help quitting, ask your health care provider. ?Activity ? ?Follow a regular exercise program to stay fit. This will help you maintain your balance. Ask your health care provider what types of exercise are appropriate for you. ?If you need a cane or walker, use it as recommended by your health care provider. ?Wear supportive shoes that have nonskid soles. ?Safety ? ?Remove any tripping hazards,  such as rugs, cords, and clutter. ?Install safety equipment such as grab bars in bathrooms and safety rails on stairs. ?Keep rooms and walkways well-lit. ?General instructions ?Talk with your health care provider about your risks for falling. Tell your health care provider if: ?You fall. Be sure to tell your health care provider about all falls, even ones that seem minor. ?You feel dizzy,  tiredness (fatigue), or off-balance. ?Take over-the-counter and prescription medicines only as told by your health care provider. These include supplements. ?Eat a healthy diet and maintain a healthy weight. A healthy diet includes low-fat dairy products, low-fat (lean) meats, and fiber from whole grains, beans, and lots of fruits and vegetables. ?Stay current with your vaccines. ?Schedule regular health, dental, and eye exams. ?Summary ?Having a healthy lifestyle and getting preventive care can help to protect your health and wellness after age 65. ?Screening and testing are the best way to find a health problem early and help you avoid having a fall. Early diagnosis and treatment give you the best chance for managing medical conditions that are more common for people who are older than age 72. ?Falls are a major cause of broken bones and head injuries in people who are older than age 19. Take precautions to prevent a fall at home. ?Work with your health care provider to learn what changes you can make to improve your health and wellness and to prevent falls. ?This information is not intended to replace advice given to you by your health care provider. Make sure you discuss any questions you have with your health care provider. ?Document Revised: 04/26/2021 Document Reviewed: 04/26/2021 ?Elsevier Patient Education ? [place]. ? ?

## 2022-04-29 NOTE — Progress Notes (Signed)
? ? Patient ID: Johnny Holland, male    DOB: 1951/01/27, 71 y.o.   MRN: [id number] ? ?This visit was conducted in person. ? ?BP 120/66   Pulse (!) 53   Temp 97.6 ?F (36.4 ?C) (Temporal)   Ht 5' 7.5" (1.715 m)   Wt 220 lb (99.8 kg)   SpO2 95%   BMI 33.95 kg/m?   ? ?CC: AMW ?Subjective:  ? ?HPI: ?Johnny Holland is a 71 y.o. male presenting on 04/29/2022 for Medicare Wellness ? ? ?Did not see health advisor this year.  ? ?Hearing Screening - Comments:: Wears bilateral hearing aids.  Wearing at today's OV.  ?Vision Screening - Comments:: Last eye exam, 02/2022.  ?[place] Office Visit from 04/29/2022 in [place] at [place]  ?PHQ-2 Total Score 0  ? ?  ?  ? ?  04/29/2022  ? 10:31 AM 03/06/2020  ?  3:23 PM  ?Fall Risk   ?Falls in the past year? 1 0  ?Number falls in past yr: 0   ?Injury with Fall? 0   [place] Fortunately no major injury despite falling onto forearm with resultant hematoma.  ? ?No significant GERD symptoms or dysphagia. H/o duodenal ulcer 1980s.  ? 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 ?Preventative: ?COLONOSCOPY WITH PROPOFOL 04/13/2020 - TA x2, divrticulosis, rpt 5 yrs (Vanga, Tally Due, MD) ?Prostate cancer screening - yearly, no significant prostate symptoms ?Lung cancer screening - yearly, reassuring  ?Fluvax - declines  ?COVID - declines  ?Td - 2007, Tdap 2018  ?Prevnar-13 02/2019, pneumovax today ?Zostavax - 2014  ?[facility]  ?Advanced directives- brought and scanned 02/2021. Wife Diane and son Koki Buxton are HCPOA. Does not want prolonged life support if terminal condition. Does want both artificial hydration and nutrition however. HCPOA can override advance directive.  ?Seat belt use discussed  ?Sunscreen use discussed. No suspicious moles. Sees derm regularly.  ?Ex smoker - quit smoking 2011 prior 1-2 ppd (42+ PY history). Continues vaping 1 cartridge/day.  ?Alcohol - rare  ?Dentist  q6 mo ?Eye exam yearly - no eye insurance ?Bowel - no constipation  ?Bladder - no incontinence ? 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   ? ?Relevant past medical, surgical, family and social history reviewed and updated as indicated. Interim medical history since our last visit reviewed. ?Allergies and medications reviewed and updated. ?Outpatient Medications Prior to Visit  ?Medication Sig Dispense Refill  ? albuterol (PROVENTIL HFA;VENTOLIN HFA) 108 (90 Base) MCG/ACT inhaler Inhale 2 puffs into the lungs every 6 (six) hours as needed for wheezing or shortness of breath. 1 Inhaler 0  ? aspirin 81 MG tablet Take 1 tablet (81 mg total) by mouth daily.    ? atorvastatin (LIPITOR) 80 MG tablet TAKE 1/2 TABLET BY MOUTH ONCE DAILY 45 tablet 3  ? Multiple Vitamin (MULTI-VITAMIN DAILY PO) Take by mouth.    ? sildenafil (REVATIO) 20 MG tablet Take 2-5 tablets (40-100 mg total) by mouth daily as needed (relations). 30 tablet 1  ? sildenafil (VIAGRA) 100 MG tablet Take 0.5-1 tablets (50-100 mg total) by mouth as needed for erectile dysfunction. 15 tablet 3  ? ?  No facility-administered medications prior to visit.  ?  ? ?Per HPI unless specifically indicated in ROS section below ?Review of Systems  ?Constitutional:  Negative for activity change, appetite change, chills, fatigue, fever and unexpected weight change.  ?HENT:  Negative for hearing loss.   ?Eyes:  Negative for visual disturbance.  ?Respiratory:  Negative for cough, chest tightness, shortness of breath and wheezing.   ?Cardiovascular:  Negative for chest pain, palpitations and leg swelling.  ?Gastrointestinal:  Negative for abdominal distention,  abdominal pain, blood in stool, constipation, diarrhea, nausea and vomiting.  ?Genitourinary:  Negative for difficulty urinating and hematuria.  ?Musculoskeletal:  Negative for arthralgias, myalgias and neck pain.  ?Skin:  Negative for rash.  ?Neurological:  Negative for dizziness, seizures, syncope and headaches.  ?Hematological:  Negative for adenopathy. Does not bruise/bleed easily.  ?Psychiatric/Behavioral:  Negative for dysphoric mood. The patient is not nervous/anxious.   ? ?Objective:  ?BP 120/66   Pulse (!) 53   Temp 97.6 ?F (36.4 ?C) (Temporal)   Ht 5' 7.5" (1.715 m)   Wt 220 lb (99.8 kg)   SpO2 95%   BMI 33.95 kg/m?   ?Wt Readings from Last 3 Encounters:  ?04/29/22 220 lb (99.8 kg)  ?12/31/21 221 lb 8 oz (100.5 kg)  ?03/08/21 220 lb 2 oz (99.8 kg)  ?  ?  ?Physical Exam ?Vitals and nursing note reviewed.  ?Constitutional:   ?   General: He is not in acute distress. ?   Appearance: Normal appearance. He is well-developed. He is not ill-appearing.  ?HENT:  ?   Head: Normocephalic and atraumatic.  ?   Right Ear: Hearing, tympanic membrane, ear canal and external ear normal.  ?   Left Ear: Hearing, tympanic membrane, ear canal and external ear normal.  ?Eyes:  ?   General: No scleral icterus. ?   Extraocular Movements: Extraocular movements intact.  ?   Conjunctiva/sclera: Conjunctivae normal.  ?   Pupils: Pupils are equal, round, and reactive to light.  ?Neck:  ?   Thyroid: No thyroid mass or thyromegaly.  ?   Vascular: Carotid bruit (referred) present.  ?Cardiovascular:  ?   Rate and Rhythm: Normal rate and regular rhythm.  ?   Pulses: Normal pulses.     ?     Radial pulses are 2+ on the right side and 2+ on the left side.  ?   Heart sounds: Murmur (3/6 systolic USB) heard.  ?Pulmonary:  ?   Effort: Pulmonary effort is normal. No respiratory distress.  ?   Breath sounds: Normal breath sounds. No wheezing, rhonchi or rales.  ?Abdominal:  ?   General: Bowel sounds are normal. There is no distension.  ?    Palpations: Abdomen is soft. There is no hepatomegaly, splenomegaly or mass.  ?   Tenderness: There is no abdominal tenderness. There is no guarding or rebound.  ?   Hernia: No hernia is present.  ?Musculoskeletal:     ?   General: Normal range of motion.  ?   Cervical back: Normal range of motion and neck supple.  ?   Right lower leg: No edema.  ?   Left lower leg: No edema.  ?   Comments: 2+ DP bilaterally  ?Lymphadenopathy:  ?   Cervical: No cervical adenopathy.  ?Skin: ?   General: Skin is warm and dry.  ?   Findings: No rash.  ?Neurological:  ?   General: No focal deficit present.  ?   Mental  Status: He is alert and oriented to person, place, and time.  ?   Comments:  ?Recall 3/3 ?Calculation 5/5 DLROW  ?Psychiatric:     ?   Mood and Affect: Mood normal.     ?   Behavior: Behavior normal.     ?   Thought Content: Thought content normal.     ?   Judgment: Judgment normal.  ? ?   ?Results for orders placed or performed in visit on 04/21/22  ?Hemoglobin A1c  ?Result Value Ref Range  ? Hgb A1c MFr Bld 5.7 4.6 - 6.5 %  ?Lipid panel  ?Result Value Ref Range  ? Cholesterol 145 0 - 200 mg/dL  ? Triglycerides 70.0 0.0 - 149.0 mg/dL  ? HDL 57.70 >39.00 mg/dL  ? VLDL 14.0 0.0 - 40.0 mg/dL  ? LDL Cholesterol 74 0 - 99 mg/dL  ? Total CHOL/HDL Ratio 3   ? NonHDL 87.52   ?Comprehensive metabolic panel  ?Result Value Ref Range  ? Sodium 142 135 - 145 mEq/L  ? Potassium 4.5 3.5 - 5.1 mEq/L  ? Chloride 103 96 - 112 mEq/L  ? CO2 31 19 - 32 mEq/L  ? Glucose, Bld 95 70 - 99 mg/dL  ? BUN 14 6 - 23 mg/dL  ? Creatinine, Ser 1.12 0.40 - 1.50 mg/dL  ? Total Bilirubin 0.9 0.2 - 1.2 mg/dL  ? Alkaline Phosphatase 86 39 - 117 U/L  ? AST 18 0 - 37 U/L  ? ALT 16 0 - 53 U/L  ? Total Protein 7.0 6.0 - 8.3 g/dL  ? Albumin 4.1 3.5 - 5.2 g/dL  ? GFR 67.54 >60.00 mL/min  ? Calcium 9.2 8.4 - 10.5 mg/dL  ?PSA, Medicare  ?Result Value Ref Range  ? PSA 3.00 0.10 - 4.00 ng/ml  ? ? ?Assessment & Plan:  ? ?Problem List Items Addressed This Visit   ? ?  Medicare annual wellness visit, subsequent - Primary (Chronic)  ?  I have personally reviewed the Medicare Annual Wellness questionnaire and have noted ?1. The patient's medical and social history ?2. Their use of alc

## 2022-04-29 NOTE — Telephone Encounter (Signed)
RN returned call to pt. No answer. VM left  Informed pt of providers response as follows:     Premed sent to pharmacy.    Advised pt to call us back [phone] should they need further assistance.  Closing encounter.

## 2022-04-29 NOTE — Telephone Encounter (Signed)
Ativan tab sent to pharmacy.

## 2022-04-30 NOTE — Assessment & Plan Note (Signed)
Ex smoker quit 2011, continues vaping.  ?Continues yearly lung cancer screening CT  ?

## 2022-04-30 NOTE — Assessment & Plan Note (Signed)
Encouraged healthy diet and lifestyle choices to affect sustainable weight loss.  ?

## 2022-04-30 NOTE — Assessment & Plan Note (Addendum)
Mild-mod AR, mild AS. Followed by cardiology, appreciate their care.  ?

## 2022-04-30 NOTE — Assessment & Plan Note (Signed)
Continue to monitor, encourage weight loss.  ?

## 2022-04-30 NOTE — Assessment & Plan Note (Signed)
Chronic, stable on atorvastatin '40mg'$  daily (1/2 of '80mg'$  tab). Finds switch from pravastatin to atorvastatin has helped ED.  ?The 10-year ASCVD risk score (Arnett DK, et al., 2019) is: 11% ?  Values used to calculate the score: ?    Age: 71 years ?    Sex: Male ?    Is Non-Hispanic African American: No ?    Diabetic: No ?    Tobacco smoker: No ?    Systolic Blood Pressure: 753 mmHg ?    Is BP treated: No ?    HDL Cholesterol: 57.7 mg/dL ?    Total Cholesterol: 145 mg/dL  ?

## 2022-04-30 NOTE — Assessment & Plan Note (Addendum)
Stable prediabetes - reviewed limiting added sugars in diet .  ?

## 2022-04-30 NOTE — Assessment & Plan Note (Signed)
Doing well on PRN generic sildenafil ?

## 2022-04-30 NOTE — Assessment & Plan Note (Signed)
Seen on imaging. Asxs.  ?

## 2022-04-30 NOTE — Assessment & Plan Note (Signed)
Continue monitoring of this.  ?

## 2022-04-30 NOTE — Assessment & Plan Note (Signed)
Continue aspirin, statin.  

## 2022-05-03 ENCOUNTER — Other Ambulatory Visit: Payer: Self-pay

## 2022-05-03 ENCOUNTER — Encounter (HOSPITAL_BASED_OUTPATIENT_CLINIC_OR_DEPARTMENT_OTHER): Payer: Self-pay | Admitting: Urology

## 2022-05-03 ENCOUNTER — Ambulatory Visit: Payer: Medicare Other | Attending: Urology | Admitting: Urology

## 2022-05-03 VITALS — BP 129/74 | HR 54 | Temp 97.6°F | Resp 17 | Ht 67.0 in | Wt 193.8 lb

## 2022-05-03 DIAGNOSIS — C679 Malignant neoplasm of bladder, unspecified: Secondary | ICD-10-CM | POA: Insufficient documentation

## 2022-05-03 LAB — EMMI, NOSE TO TOES PRE-OPERATIVE CLEANSING: EMMI Video Order Number: 18785617228

## 2022-05-03 MED ORDER — DIAZEPAM 5 MG OR TABS
5.0000 mg | ORAL_TABLET | Freq: Four times a day (QID) | ORAL | 0 refills | Status: DC | PRN
Start: 2022-05-03 — End: 2022-07-07

## 2022-05-03 NOTE — Patient Instructions (Addendum)
PLAN:     Please call [facility] Scheduling at [phone] to schedule your appointment at your earliest convenience. In order to obtain the most accurate insurance approval, we secure authorization after you are registered and scheduled for imaging services.  Please have your insurance cards available when scheduling your exam.     Your prescription has been sent to your retail pharmacy.  (Valium for pre-MRI)    Your physician has ordered a walk-in x-ray exam.  Please choose from a location below and go directly to the department or at the time your physician has instructed.  You will be served by the imaging department on a first come, first serve basis.  If you have any questions, please call the clinic or one of the front desk numbers listed below.  -[place]  [phone]    Hours: 8a-5p Mon-Fri  -[facility] in [place], [place], [place] (does not take all insurances, please call ahead)        [phone]   Hours: 8a-7p daily/Saturday and Sunday closed 12p-1p for lunch  -[facility] - [address], [place], [place] (does not take all insurances, please call ahead)  [phone]  Hours: 8a-12p/1p-7p daily (closed for lunch)  -[facility] - [place], [address], [place], [place] (does not take all insurances, please call ahead)  [phone]  Hours: 8a-12p/1p-7p daily (closed for lunch)  -[place], [place], [place]  (limited access, please call ahead)  [phone]  Hours: 8:30a-12p/1p-5p (closed for lunch) Mon-Fri  -[facility] [address] [place], [place]   [phone] or [phone]  Hours: 8a-4:30p, Mon-Fri    Morene Antu will contact you in 3-5 business days to schedule surgery  Please submit a urine sample 2-3 weeks before surgery  Morene Antu will let you know if COVID test is required prior to surgery   If you are taking  blood thinners, please obtain clearance from the prescribing physician to hold them prior to surgery   PLEASE HOLD ALL NSAIDS (non-steroidal anti-inflammatory drugs) SUCH AS advil, aleve, motrin, ibuprofen, relafen, lodine, feldene, Diclofenac, voltaren, indomethacin, naproxen, celebrex, Mobic 7 days before surgery.   Please hold vitamins, supplements, herbs & fish oil 7 days before surgery.  See below for a more extensive list of medications that may thin your blood.      For more information on Urology, please visit:   [email or web address]    [email or web address]      Carlota Raspberry MD  Department of Urology     [facility] - Urology  Monday-Friday 8:00- 5:00p.m. (Closed Holidays and Weekends)     For any question or to schedule any appointment call [phone]    AFTER HOURS EMERGENCY NUMBER [phone]  Ask for the Urologist/Oncologist Physician On-Call.        [facility] - Urology  Fax number: [phone]     Clinical Nurse  Vicente Males  Phone: [phone]  Fax: [phone]    Administrative Assistant/[facility]   Phone: [phone]  Fax: [phone]    Social Worker  Agnes Lawrence, LCSW  Phone: [phone]

## 2022-05-03 NOTE — Progress Notes (Signed)
ORTHOPAEDIC SPINE SURGERY     Visit Type: Office Visit    Reason For Visit: Recheck and Back Pain    Surgery Date: 11/08/2021    Procedure: XLIF L2-3 and PSIF L2 thru pelvis     History Of Present Illness: 71 year old male returns to spine clinic for follow up evaluation now approximately 6 months s/p revision lumbar decompression and fusion L2 thru pelvis. He states that he is doing well although has had ongoing complaints of generalized back stiffness and intermittent pain, which is well controlled. He had completed a course of PT, although has had several medical issues. 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 His primary complaint today is neck pain and stiffness with associated pain and paresthesias extending into his upper extremities and shoulder.  Patient rates his pain as 6. Patient reports no changes in bowel and bladder function. Patient returns for follow up.    Allergies:   Allergies   Allergen Reactions   . Amoxicillin Rash     Not allergic. Patient tolerating ampicillin,    . Sulfa Drugs Unspecified       Medications:   Current Outpatient Medications   Medication Sig   . acetaminophen (TYLENOL) 325 MG tablet Take 2 tablets (650 mg) by mouth every 6 hours as needed for Mild Pain (Pain Score 1-3) (alternate with motrin).   Marland Kitchen albuterol 108 (90 Base) MCG/ACT inhaler Inhale 2 puffs by mouth every 4 hours as needed for Wheezing.   Marland Kitchen allopurinol (ZYLOPRIM) 100 MG tablet Take 1 tablet (100 mg) by mouth daily.   Marland Kitchen aluminum-magnesium-simethicone (MAG-AL PLUS) 200-200-20 MG/5ML suspension Take 30 mL by mouth every 6 hours as needed for Heartburn.   Marland Kitchen ascorbic acid (VITAMIN C) 500 MG tablet Take 1 tablet (500 mg) by mouth daily.   . bisacodyl (DULCOLAX) 10 MG suppository Insert 1 suppository (10 mg) rectally daily as needed for Constipation.   . ciclopirox (LOPROX) 0.77 % GEL Apply topically 2 times daily.   Marland Kitchen docusate sodium (COLACE) 250 MG capsule  Take 1 capsule (250 mg) by mouth 2 times daily.   . DULoxetine (CYMBALTA) 20 MG CR capsule Take 1 capsule (20 mg) by mouth daily.   . heparin 100 UNIT/ML injection 1 mL (100 Units) by IntraCATHETER route every 6 hours as needed (PICC care).   . heparin 5000 UNIT/ML injection Inject 1 mL (5,000 Units) under the skin every 8 hours.   Marland Kitchen ketotifen (ALAWAY) 0.025 % ophthalmic solution Place 1 drop into both eyes 2 times daily.   Marland Kitchen lidocaine (ASPERCREME) 4 % patch Apply 1 patch topically every 24 hours. Leave patch on for 12 hours, then remove for 12 hours.   . Loratadine (ALAVERT PO) Take 10 mg by mouth daily.   Marland Kitchen LORazepam (ATIVAN) 1 MG tablet Place 1/2 tab Sublingual 30 minutes prior to procedure, may repeat once. Do not exceed 1 tablet   . melatonin 5 MG tablet Take 1 tablet (5 mg) by mouth at bedtime.   . methylPREDNISolone (MEDROL DOSEPACK) 4 MG tablet Take as directed on package   . Multiple Vitamins-Minerals (MULTIVITAMIN WITH MINERALS) TABS tablet Take 1 tablet by mouth daily.   . naloxone (NARCAN) 4 mg/0.1 mL nasal spray For suspected opioid overdose, call 911! Then spray once in one nostril. Repeat after 3 minutes if no or minimal response using a new spray in other nostril.   . nitrofurantoin monohydrate (MACROBID) 100 MG capsule Take 1  capsule (100 mg) by mouth 2 times daily.   . ondansetron (ZOFRAN ODT) 8 MG disintegrating tablet Take 1 tablet (8 mg) on or under the tongue every 8 hours as needed for Nausea/Vomiting.   Marland Kitchen oxyCODONE (ROXICODONE) 5 MG immediate release tablet Take 1 tablet (5 mg) by mouth 2 times daily as needed for Moderate Pain (Pain Score 4-6) or Severe Pain (Pain Score 7-10).   [name] Glycol-Propyl Glycol (SYSTANE ULTRA OP)    . polyethylene glycol (GLYCOLAX) 17 GM/SCOOP powder Take 17 g by mouth daily. Mix with 4 to 8 ounces of fluid (water, juice, soda, coffee, or tea) prior to administration.   . pregabalin (LYRICA) 100 MG capsule TAKE 1 CAPSULE BY MOUTH EVERYDAY AT BEDTIME   .  senna (SENOKOT) 8.6 MG tablet Take 1 tablet (8.6 mg) by mouth daily.   . sodium chloride 0.9 % flush 10 mL by IntraCATHETER route every 8 hours.   . tamsulosin (FLOMAX) 0.4 MG capsule TAKE 1 CAPSULE BY MOUTH EVERY DAY   . tizanidine (ZANAFLEX) 2 MG tablet Take 1 tablet (2 mg) by mouth every 8 hours as needed (spasm). (Patient taking differently: Take 2 tablets (4 mg) by mouth every 8 hours as needed (spasm).)   . traZODone (DESYREL) 50 MG tablet Take 0.5 tablets (25 mg) by mouth at bedtime.   . triamcinolone (KENALOG) 0.1 % cream Apply 1 Application. topically 2 times daily. Apply a thin layer as directed   . VITAMIN D PO Take 125 mg by mouth daily.     No current facility-administered medications for this visit.         Review of Systems:  CONSTITUTIONAL: No unexpected weight loss, fevers, chills, or anorexia. SKIN: Noncontributory. GENITOURINARY: Symptoms as noted above in HPI. NEUROLOGIC: Symptoms as noted above in HPI. MUSCULOSKELETAL: Symptoms as noted above in HPI.    Physical Examination:  There were no vitals taken for this visit.  CONSTITUTIONAL: Well appearing and well groomed. PSYCHOLOGICAL: Alert and oriented and appropriate to situation. INTEGUMENT: Intact. VASCULAR: Radial and pedal pulses are intact and symmetric. EXTREMITIES: Bilateral upper and lower extremities have good range of motion and no significant deformities. GAIT: Brisk with good coordination.     CERVICAL SPINE: Nontender to palpation. Spurling's test negative. Sensation intact of the upper extremities.    RANGE OF MOTION (in degrees)       RT LT  Lateral motion   80  80  Lateral bend   45  45  Flexion 40 degrees. Extension 35 degrees. Motion in all planes is nonpainful.    MOTOR    RT LT  Trapezius   5/5  5/5  Deltoid    5/5  5/5  Bicep    5/5  5/5  Tricep    5/5  5/5  Wrist flexor   5/5  5/5  Wrist extensor  5/5  5/5  Intrinsics   5/5  5/5  Grip    5/5  5/5    DEEP TENDON REFLEXES       RT  LT  Bicep    2+  2+  Brachioradialis  2+   2+  Tricep    2+  2+    THORACIC SPINE  Nontender to percussion. Alignment - normal, no paravertebral muscle fullness. Sensation intact. Beevor's negative. Abdominal reflexes are absent and symmetric.     LUMBAR SPINE  Nontender to percussion. Heel to toe walk without deficit. Sensation intact of the lower extremities. Trendelenburg sign negative. Skin  intact, incisions well healed.    RANGE OF MOTION        RT  LT  lateral bend, normal at 25 degrees 25  25  rotation, normal at 30 degrees 30  30  Flexion - normal at 40 degrees. Extension - normal at 10 degrees.    MOTOR STRENGTH       RT  LT  Iliopsoas   5/5  5/5  Quadricep   5/5  5/5  Anterior tibialis   5/5  5/5  Extensor hallucis longus 5/5  5/5  Gastrocsoleus  5/5  5/5    DEEP TENDON REFLEXES       RT  LT  Patellar   2+  2+  Achilles   2+  2+    PATHOLOGIC REFLEXES      RT LT  Clonus    Neg Neg  Babinski   Neg Neg  Hoffmans   Neg Neg    STRAIGHT LEG RAISING       RT  LT  Sitting @ 90 degrees   Neg Neg    IMAGING STUDIES:   Plain radiographs lumbar spine shows:  FINDINGS:   There are five lumbar type vertebrae.    Redemonstrated is the L2 through S1 bilateral transpedicular fixation with X lift interbody metallic cage at O7/2 and X lift interbody PEEK cages L3/4 and L4/5 and ALIF at L5/S1. Alignment is maintained. No significant subsidence. Similar degree of anterolisthesis of L5 on S1. There is also been posterior decompression L3 and L4. Adjacent level disc space unchanged.    Probable left nephrostomy tube.    IMPRESSION:  Alignment maintained, no complications.    OTHER MEDICAL RECORDS/IMAGING STUDIES REVIEWED: None    IMPRESSION:   Encounter Diagnoses   Name Primary?   . Cervical radiculopathy Yes   . Neck pain    . Chronic left shoulder pain         PLAN/DISCUSSION: Reviewed clinical history as well as exam results and imaging findings with patient in detail. He is doing well following revision lumbar fusion L2 through pelvis. His primary complaint  today is ongoing and worsening neck pain with radiating pain into shoulder and arm. Placed order for MRI cervical spine for further evaluation . Recommend ongoing PT and HEP for lower back.  All questions were answered and Lekeith Wulf understood and was satisfied with this plan.     FOLLOWUP: Return to clinic: three months. The patient is encouraged to call us with any questions or problems in the interim. Our contact numbers were given to the patient.

## 2022-05-03 NOTE — Progress Notes (Signed)
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  CT urography on 04/22/2021 confirmed a filling defect in the anterior left renal pelvis.  Chest CT has been ordered but not performed yet.  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  He tolerated the procedure well.      He received 6 instillations of gelatinous mitomycin between August 05, 2021 and September 09, 2021.    MR urogram from October 26, 2021 with mild thickening in the left renal pelvis otherwise unremarkable.    Baseline creatinine has remained pre stable at 1.32    Notably, [facility] bladder cancer on 08-19-2021; repeat on 08-26-2021 no cancer.    He received ureteroscopy and transurethral resection of bladder tumor on December 24, 2021 which was notable for small volume low-grade appearing tumors in the renal pelvis as well as a tumor in the mid ureter.  Final pathology from the bladder was notable for high-grade urothelial dysplasia.    He was readmitted for urinary tract infection and hydronephrosis despite a stent the nephrostomy tube was placed on January 11, 2022.  Stent removal has been performed and nephrostogram on 02/11/2022 shows minimal drainage of contrast into the bladder.  Repeat anterograde nephrostogram following a course of steroids was notable for partial anterograde egress of contrast into the bladder    He has also completed intravesical BCG.    Review of Systems: No TIA's or unusual headaches, no dysphagia.  No prolonged cough. No dyspnea or chest pain on exertion.  No abdominal pain, change in bowel habits, black or bloody stools.  No urinary tract  or BPH symptoms.  No new or unusual musculoskeletal symptoms.    Physical Exam:  There were no vitals filed for this visit.   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    -at this point I would advise operative cystoscopy and ureteroscopy to further evaluate his lower and upper tract.  Stent would be placed at that time.  The nephrostomy tube can be capped to confirm the stent is working.  We will then plan on stent removal followed by anterograde nephrostogram and nephrostomy tube removal.    Complete restaging also CT chest abdomen and pelvis

## 2022-05-04 ENCOUNTER — Ambulatory Visit
Admission: RE | Admit: 2022-05-04 | Discharge: 2022-05-04 | Disposition: A | Discharge status: Home / Self Care | Payer: Medicare Other | Source: Ambulatory Visit | Attending: Acute Care | Admitting: Acute Care

## 2022-05-04 DIAGNOSIS — Z122 Encounter for screening for malignant neoplasm of respiratory organs: Secondary | ICD-10-CM | POA: Insufficient documentation

## 2022-05-04 DIAGNOSIS — Z87891 Personal history of nicotine dependence: Secondary | ICD-10-CM | POA: Insufficient documentation

## 2022-05-06 ENCOUNTER — Other Ambulatory Visit: Payer: Self-pay | Admitting: Acute Care

## 2022-05-06 ENCOUNTER — Encounter (HOSPITAL_BASED_OUTPATIENT_CLINIC_OR_DEPARTMENT_OTHER): Payer: Self-pay | Admitting: Vascular & Interventional Radiology

## 2022-05-06 ENCOUNTER — Ambulatory Visit (HOSPITAL_BASED_OUTPATIENT_CLINIC_OR_DEPARTMENT_OTHER): Payer: Medicare Other

## 2022-05-06 ENCOUNTER — Ambulatory Visit
Admission: RE | Admit: 2022-05-06 | Discharge: 2022-05-06 | Disposition: A | Discharge status: Home / Self Care | Payer: Medicare Other | Source: Ambulatory Visit | Attending: Vascular & Interventional Radiology | Admitting: Vascular & Interventional Radiology

## 2022-05-06 ENCOUNTER — Encounter (HOSPITAL_BASED_OUTPATIENT_CLINIC_OR_DEPARTMENT_OTHER)
Admission: RE | Disposition: A | Discharge status: Home Health | Payer: Self-pay | Source: Ambulatory Visit | Attending: Vascular & Interventional Radiology

## 2022-05-06 DIAGNOSIS — Z87891 Personal history of nicotine dependence: Secondary | ICD-10-CM

## 2022-05-06 DIAGNOSIS — Z122 Encounter for screening for malignant neoplasm of respiratory organs: Secondary | ICD-10-CM

## 2022-05-06 DIAGNOSIS — N1330 Unspecified hydronephrosis: Secondary | ICD-10-CM

## 2022-05-06 DIAGNOSIS — Z436 Encounter for attention to other artificial openings of urinary tract: Secondary | ICD-10-CM | POA: Insufficient documentation

## 2022-05-06 DIAGNOSIS — C662 Malignant neoplasm of left ureter: Secondary | ICD-10-CM | POA: Insufficient documentation

## 2022-05-06 SURGERY — IR CHNG NEPHROSTOMY TUBE
Anesthesia: Local | Laterality: Left

## 2022-05-06 MED ORDER — LIDOCAINE HCL 1 % IJ SOLN
INTRAMUSCULAR | Status: AC
Start: 2022-05-06 — End: 2022-05-06
  Filled 2022-05-06: qty 10

## 2022-05-06 MED ORDER — IODIXANOL 320 MG/ML IV SOLN
INTRAVENOUS | Status: AC
Start: 2022-05-06 — End: 2022-05-06
  Filled 2022-05-06: qty 50

## 2022-05-06 MED ORDER — IODIXANOL 320 MG/ML IV SOLN
INTRAVENOUS | Status: DC | PRN
Start: 2022-05-06 — End: 2022-05-06
  Administered 2022-05-06: 10 mL

## 2022-05-06 MED ORDER — LIDOCAINE HCL 1 % IJ SOLN
INTRAMUSCULAR | Status: DC | PRN
Start: 2022-05-06 — End: 2022-05-06
  Administered 2022-05-06: 10 mL via INTRADERMAL

## 2022-05-06 SURGICAL SUPPLY — 12 items
BAG DRAINAGE NEPHROSTOMY 600ML (Misc Medical Supply) ×6 IMPLANT
CATHETER DRAIN MULTIPURPOSE MAC-LOC 8.5FR X 25CM (Lines/Drains) ×3 IMPLANT
DECANTER BAG-A-JET STERILE (Misc Medical Supply) ×6 IMPLANT
DRAPE ANGIO TIBURON BRACHIAL 38" X 44" (Drapes/towels) ×3
DRESSING SPONGE 2X2X4 PLY (Dressings/packing) ×3
DRESSING SPONGE IV 2X2 STRL (Dressings/packing) ×6 IMPLANT
DRESSING TEGADERM I.V. ADVANCED 4X4 1688 (Dressings/packing) ×3
GLOVE BIOGEL PI ULTRATOUCH SIZE 6 (Gloves/Gowns) ×9 IMPLANT
GLOVE BIOGEL PI ULTRATOUCH SIZE 7 (Gloves/Gowns) ×3 IMPLANT
GUIDEWIRE AMPLATZ SUPER STIFF 0.035" X 75CM, 7CM TAPER, STRAIGHT (Procedural wires/sheaths/catheters/balloons/dilators) ×3
PROCEDURE PACK - IR NON-VASCULAR (Procedure Packs/kits) ×3 IMPLANT
SUTURE ETHILON 2-0 18" FS (Suture) ×3 IMPLANT

## 2022-05-06 NOTE — Brief Op Note (Signed)
BRIEF OPERATIVE NOTE    DATE: 05/06/2022  TIME: 10:21 AM    PREOPERATIVE DIAGNOSIS: Urothelial [place]    POSTOPERATIVE DIAGNOSIS: same    PROCEDURE INFORMATION:  Procedure(s):  IR NEPHROSTOMY CHANGE    ATTENDING SURGEON:   Surgeon(s) and Role:     * Redmond, Berdine Dance, MD - Primary     * Loni Muse, MD - Fellow    ANESTHESIA:   local    FINDINGS:   Successful LEFT PCN exchange with new 8.65F tube.    SPECIMENS: none    Fluids/Blood Products:      IV Fluids: none    Blood Products: none    EBL: none    Urine Output: none    COMPLICATIONS: none    DISPOSITION:   - routine change in 2-3 months.    Lurlean Horns, MD - PGY 6   Resident, Interventional Radiology    Interventional Radiology Pagers:    Working Hours (M-F, 7 am - 5 pm)  [facility] - "Group [facility] Pager"  [facility] - "Group [facility] Pager"    After Hours ( M-F, 5 pm - 7 am and weekends)   All Hospials: "Call IR Home" [phone]

## 2022-05-07 ENCOUNTER — Inpatient Hospital Stay (INDEPENDENT_AMBULATORY_CARE_PROVIDER_SITE_OTHER)
Admit: 2022-05-07 | Discharge: 2022-05-07 | Disposition: A | Discharge status: Home Health | Payer: Medicare Other | Attending: Orthopaedic Surgery of the Spine | Admitting: Orthopaedic Surgery of the Spine

## 2022-05-07 DIAGNOSIS — M542 Cervicalgia: Secondary | ICD-10-CM

## 2022-05-07 DIAGNOSIS — M4802 Spinal stenosis, cervical region: Secondary | ICD-10-CM

## 2022-05-07 DIAGNOSIS — G8929 Other chronic pain: Secondary | ICD-10-CM

## 2022-05-07 DIAGNOSIS — M4722 Other spondylosis with radiculopathy, cervical region: Secondary | ICD-10-CM

## 2022-05-07 DIAGNOSIS — G9589 Other specified diseases of spinal cord: Secondary | ICD-10-CM

## 2022-05-07 DIAGNOSIS — M5412 Radiculopathy, cervical region: Secondary | ICD-10-CM

## 2022-05-09 ENCOUNTER — Telehealth (HOSPITAL_BASED_OUTPATIENT_CLINIC_OR_DEPARTMENT_OTHER): Payer: Self-pay | Admitting: Urology

## 2022-05-09 NOTE — Telephone Encounter (Signed)
SURGERY: Left ureteroscopy, possible biopsy, possible laser fulguration of lesion, possible cytology washings, possible insertion of ureteral stent     Surgeon: Dr. Baron Hamper with patient - scheduled surgery at [facility] and provided plan below.    Reviewed pre op instructions with patient - NPO after midnight    Reviewed Transportation with family/friend after discharge   - Please arrange for transportation home after your surgery and hospital stay. A taxi, shuttle bus, uber or lyft is NOT acceptable! You will need to be accompanied home by a responsible adult.  - You must have a friend or relative accompany you on the day of surgery.  This person will be asked to review discharge instructions with the nurse and to take you home. If you do not bring someone, your surgery may be cancelled.   - You CANNOT drive yourself home or use public transportation. You CANNOT use a ride-share service (Uber/Lyft) or a taxi unless you have your friend or relative accompany you    Reviewed [facility] visitor policy - up to 2 visitors allowed    Pt verbalized understanding    QUESTIONS  Sleep Apnea - no  Pacemaker/Cardiac Issues - no  Catheter/Port/NT - NT  Blood Thinners - no  Smoking? - no   [Patient aware to discontinue any blood thinning medications/vitamins 7 days prior to surgery]    Hospitalized w/in 3 months - no  Able to walk up 2 flight of stairs - yes  Difficulty w/ anesthesia or airway - no    SURGERY PLAN  [Schedule pre op w/ NP Castano for Dr. Victorino December major cases]  DATE -  06/14  CHECK IN - 9:00 AM  APC - TBD  POV - 06/27  [Patient aware surgery time is subject to change]      Urine Culture - to be completed w/in 30 days - pt aware    Is Medical Clearance needed - not indicated    Surgery Letter - to be sent via MyChart      Surgical Consent - media     Vasectomy Sterilizaton Consent - not needed    Auth:  Will request as time gets closer per auth teams request.       *NOTES IF ANY

## 2022-05-09 NOTE — Progress Notes (Signed)
NEPHROLOGY CLINIC VISIT - INITIAL     Referring MD: [name]  Primary MD: Laurine Blazer    Reason for Visit: Chronic Kidney Disease    HPI:  Stephen [name] is a 71 year old male who is referred to renal clinic for evaluation and management of kidey disease.     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 Last hospital admission in Jan 2023 for urinary tract infection and hydronephrosis.    During this visit patient is feeling fine. Denies chest pain and shortness of breath. No urinary tract or BPH symptoms.     Review of labs showed stable kidney function with Scr 1.21 mg/dl.      Review of Systems:  GEN: denies fevers, chills, weight changes  HEENT: denies odynophagia,dysphagia  Cardio: denies chest pain, palpitations, exertional symptoms  Resp: denies dyspnea, cough  GI: denies nausea, vomiting, diarrhea, constipation  GU: denies hematuria, dysuria, polyuria, nocturia  Ext: denies edema  MSK: denies weakness, myalgias, arthralgias  Neuro: denies paresthesias, weakness  Integument: denies rash, bruising    PMH, reviewed with patient:  Active Ambulatory Problems     Diagnosis Date Noted   . Post-traumatic stricture of anterior urethra 05/23/2017   . Bladder neck contracture 10/11/2017   . Lumbar radiculopathy 10/18/2017   . Cervical radiculopathy 01/15/2018   . Incomplete bladder emptying 02/01/2018   . Lumbosacral spondylosis without myelopathy 05/24/2018   . Chronic SI joint pain 12/20/2018   . Degenerative spondylolisthesis 03/24/2019   . Lumbar stenosis with neurogenic claudication 03/24/2019   . Pre-procedure lab exam 04/22/2019   . Cervical spondylosis without myelopathy 04/22/2019   . Cervical spondylosis  06/11/2019   . S/P lumbar fusion 09/06/2019   . Chronic left SI joint pain 02/19/2020   . Chronic midline thoracic back pain 05/06/2020   . Spondylolisthesis at L5-S1 level 08/18/2020   . Left renal mass 04/29/2021   . Malignant neoplasm of urinary bladder, unspecified site (CMS-HCC) 06/25/2021   . Dorsopathy 06/28/2021   . Ureter malignant neoplasm, left (CMS-HCC) 10/26/2021   . Cauda equina compression (CMS-HCC) 10/29/2021   . COVID-19 virus infection 12/10/2021   . Community acquired pneumonia 12/10/2021   . Acute urinary retention 12/10/2021   . HTN (hypertension) 12/10/2021   . Gout 12/10/2021   . MDD (major depressive disorder) 12/10/2021   . Urothelial cancer (CMS-HCC) 12/10/2021   . Urinary tract infection associated with indwelling urethral catheter (CMS-HCC) 01/11/2022   . Ureteral obstruction 01/11/2022   . Hydronephrosis 01/11/2022   . Urothelial carcinoma of kidney, left (CMS-HCC) 02/22/2022   . Myalgia, other site 03/07/2022   . Trigger point with back pain 03/07/2022     Resolved Ambulatory Problems     Diagnosis Date Noted   . Acute hypoxemic respiratory failure (CMS-HCC) 12/10/2021   . Pyelonephritis 01/10/2022   . Sepsis (CMS-HCC) 01/11/2022   . AKI (acute kidney injury) (CMS-HCC) 01/11/2022     Past Medical History:   Diagnosis Date   . Chronic back pain    . Congenital hydronephrosis    . Headache    . Hematuria    . Kidney disease    . Kidney stones    . Major depressive disorder, single episode    . Polyarthropathy or polyarthritis  of multiple sites    . Retinal detachment    . Urethral stricture        Social History, reviewed with patient:  Social History     Socioeconomic History   . Marital status: Single     Spouse name: Not on file   . Number of children: Not on file   . Years of education: Not on file   . Highest education level: Not on file   Occupational History   . Not on file   Tobacco Use   . Smoking status: Never   . Smokeless tobacco: Never   Vaping Use   . Vaping Use: Never used    Substance and Sexual Activity   . Alcohol use: No   . Drug use: Not Currently   . Sexual activity: Not Currently     Partners: Female   Other Topics Concern   . Military Service No   . Blood Transfusions Yes   . Caffeine Concern No   . Occupational Exposure No   . Hobby Hazards No   . Sleep Concern Yes     Comment: due to my lower back discomfort and leg discomfort   . Stress Concern Yes     Comment: Health/ housing/ financial   . Weight Concern No   . Special Diet No   . Back Care Yes     Comment: Am very careful with any activity   . Exercises Regularly Yes   . Seat Belt Use Yes   . Performs Self-Exams Yes   . Bike Helmet Use No   Social History Narrative   . Not on file     Social Determinants of Health     Financial Resource Strain: Not on file   Food Insecurity: Not on file   Transportation Needs: Not on file   Physical Activity: Not on file   Stress: Not on file   Social Connections: Not on file   Intimate Partner Violence: Not on file   Housing Stability: Not on file       Family History, reviewed with patient:  Family History   Adopted: Yes   Family history unknown: Yes       Allergies:  Allergies   Allergen Reactions   . Sulfa Drugs Unspecified       Medications  Reviewed and reconciled with patient during encounter  Current Outpatient Medications on File Prior to Visit   Medication Sig Dispense Refill   . acetaminophen (TYLENOL) 325 MG tablet Take 2 tablets (650 mg) by mouth every 6 hours as needed for Mild Pain (Pain Score 1-3) (alternate with motrin). 30 tablet 0   . albuterol 108 (90 Base) MCG/ACT inhaler Inhale 2 puffs by mouth every 4 hours as needed for Wheezing. 6.7 g 0   . allopurinol (ZYLOPRIM) 100 MG tablet Take 1 tablet (100 mg) by mouth daily. 30 tablet 0   . aluminum-magnesium-simethicone (MAG-AL PLUS) 287-867-67 MG/5ML suspension Take 30 mL by mouth every 6 hours as needed for Heartburn. 240 mL 0   . ascorbic acid (VITAMIN C) 500 MG tablet Take 1 tablet (500 mg) by mouth daily.     .  bisacodyl (DULCOLAX) 10 MG suppository Insert 1 suppository (10 mg) rectally daily as needed for Constipation. 10 suppository 0   . ciclopirox (LOPROX) 0.77 % GEL Apply topically 2 times daily.     Marland Kitchen docusate sodium (COLACE) 250 MG capsule Take 1 capsule (250 mg) by mouth 2 times daily. [place]  capsule 0   . DULoxetine (CYMBALTA) 20 MG CR capsule Take 1 capsule (20 mg) by mouth daily. 90 capsule 3   . [DISCONTINUED] gabapentin (NEURONTIN) 100 MG capsule Take 1 capsule (100 mg) by mouth 3 times daily for 7 days. Start taking after your procedure is completed. 21 capsule 0   . heparin 100 UNIT/ML injection 1 mL (100 Units) by IntraCATHETER route every 6 hours as needed (PICC care). 30 each 0   . heparin 5000 UNIT/ML injection Inject 1 mL (5,000 Units) under the skin every 8 hours. 90 each 0   . ketotifen (ALAWAY) 0.025 % ophthalmic solution Place 1 drop into both eyes 2 times daily.     Marland Kitchen lidocaine (ASPERCREME) 4 % patch Apply 1 patch topically every 24 hours. Leave patch on for 12 hours, then remove for 12 hours. 30 patch 0   . Loratadine (ALAVERT PO) Take 10 mg by mouth daily.     . melatonin 5 MG tablet Take 1 tablet (5 mg) by mouth at bedtime. 30 tablet 0   . methylPREDNISolone (MEDROL DOSEPACK) 4 MG tablet Take as directed on package 1 each 0   . Multiple Vitamins-Minerals (MULTIVITAMIN WITH MINERALS) TABS tablet Take 1 tablet by mouth daily. 30 tablet 1   . naloxone (NARCAN) 4 mg/0.1 mL nasal spray For suspected opioid overdose, call 911! Then spray once in one nostril. Repeat after 3 minutes if no or minimal response using a new spray in other nostril. 2 each 0   . nitrofurantoin monohydrate (MACROBID) 100 MG capsule Take 1 capsule (100 mg) by mouth 2 times daily. 20 capsule 0   . ondansetron (ZOFRAN ODT) 8 MG disintegrating tablet Take 1 tablet (8 mg) on or under the tongue every 8 hours as needed for Nausea/Vomiting. 10 tablet 0   . oxyCODONE (ROXICODONE) 5 MG immediate release tablet Take 1 tablet (5 mg) by mouth  2 times daily as needed for Moderate Pain (Pain Score 4-6) or Severe Pain (Pain Score 7-10). 30 tablet 0   . Polyethyl Glycol-Propyl Glycol (SYSTANE ULTRA OP)      . polyethylene glycol (GLYCOLAX) 17 GM/SCOOP powder Take 17 g by mouth daily. Mix with 4 to 8 ounces of fluid (water, juice, soda, coffee, or tea) prior to administration. 255 g 0   . senna (SENOKOT) 8.6 MG tablet Take 1 tablet (8.6 mg) by mouth daily. 30 tablet 0   . sodium chloride 0.9 % flush 10 mL by IntraCATHETER route every 8 hours. 90 each 0   . tamsulosin (FLOMAX) 0.4 MG capsule TAKE 1 CAPSULE BY MOUTH EVERY DAY 30 capsule 3   . tizanidine (ZANAFLEX) 2 MG tablet Take 1 tablet (2 mg) by mouth every 8 hours as needed (spasm). (Patient taking differently: Take 2 tablets (4 mg) by mouth every 8 hours as needed (spasm).) 40 tablet 0   . traZODone (DESYREL) 50 MG tablet Take 0.5 tablets (25 mg) by mouth at bedtime. 30 tablet 0   . triamcinolone (KENALOG) 0.1 % cream Apply 1 Application. topically 2 times daily. Apply a thin layer as directed     . VITAMIN D PO Take 125 mg by mouth daily.       No current facility-administered medications on file prior to visit.       Physical exam:  BP 108/67 (BP Location: Left arm, BP Patient Position: Sitting, BP cuff size: Large)   Pulse (!) 48   Ht '5\' 7"'$  (1.702 m)   Wt 87.5  kg (192 lb 14.4 oz)   BMI 30.21 kg/m   GEN: Alert, oriented, and in no acute distress.   HEENT: normocephalic, EOMI, oropharynx clear with MMM  Neck: JVP does not appear elevated.   CV: RRR, no murmurs, rubs, or gallops.    Lungs: Clear to auscultation bilaterally.   GI: Soft, nontender, nondistended with normoactive bowel sounds  Extremities: No edema    Labs:  Lab Results   Component Value Date    NA 141 04/07/2022    K 4.5 04/07/2022    CL 104 04/07/2022    BICARB 25 04/07/2022    BUN 23 04/07/2022    CREAT 1.21 (H) 04/07/2022    GLU 112 (H) 04/07/2022    [place] 9.2 04/07/2022     Lab Results   Component Value Date    WBC 5.4 04/07/2022     HGB 13.3 (L) 04/07/2022    HCT 40.7 04/07/2022    PLT 221 04/07/2022     No results found for: PTHINTACT  No results found for: VITD25HYDROX, VD2, VD3, VDT    Lab Results   Component Value Date    COLORUA Light-Yellow 04/07/2022    APPEARUA Excessively turbid 04/07/2022    GLUCOSEUA Negative 04/07/2022    BILIUA Negative 04/07/2022    KETONEUA Negative 04/07/2022    SGUA 1.016 04/07/2022    BLOODUA Trace (A) 04/07/2022    PHUA 6.0 04/07/2022    PROTEINUA 1+ (A) 04/07/2022    UROBILUA Negative 04/07/2022    NITRITEUA 2+ (A) 04/07/2022    LEUKESTUA 500 (A) 04/07/2022    WBCUA >50 (A) 04/07/2022    RBCUA 0-2 04/07/2022    HYALINEUA >5 (A) 12/01/2021       Assessment/Plan: Hakiem Malizia is a 71 year old male who is referred to renal clinic for evaluation and management of kidey disease.     CKD stage 3 likely due to reduced renal mass (solitary kidney), prostate/urothelial cancer and AKI episode contributing to mal-adaptive repair. Continue to monitor kidney function. Get labs prior to next clinic visit. CKD education, prevention and treatment options discussed. Avoid chronic use of NSAID.    Acid-base/electrolyte: stable and at goal.    Prostate cancer/urothelial cancer: Managed by urology.    All questions answered. Follow-up in 6 months.    I spent total of42mn on this patients care on the day of visit, including face to face time with the patient as well as time spent reviewing patient records and test results, documenting in the medical records, obtaining history and exam, counseling and education, placing order and co-ordinating care

## 2022-05-10 LAB — EMMI,  PAIN MANAGEMENT: ACUTE IN HOSPITAL: EMMI Video Order Number: 17020870501

## 2022-05-10 LAB — EMMI , ANESTHESIA (ADULT): EMMI Video Order Number: 12710503819

## 2022-05-10 LAB — EMMI , PATIENT SATISFACTION: HOSPITAL DISCHARGE EXPECTATIONS: EMMI Video Order Number: 12321141223

## 2022-05-12 ENCOUNTER — Encounter (HOSPITAL_BASED_OUTPATIENT_CLINIC_OR_DEPARTMENT_OTHER): Payer: Self-pay | Admitting: Urology

## 2022-05-12 ENCOUNTER — Other Ambulatory Visit: Payer: Self-pay

## 2022-05-12 ENCOUNTER — Encounter (HOSPITAL_BASED_OUTPATIENT_CLINIC_OR_DEPARTMENT_OTHER): Payer: Self-pay | Admitting: Orthopaedic Surgery of the Spine

## 2022-05-13 ENCOUNTER — Other Ambulatory Visit: Payer: Self-pay | Admitting: Cardiovascular Disease

## 2022-05-13 NOTE — Telephone Encounter (Signed)
Routed to app    No follow up scheduled yet. Clinic note advises 3 month follow up yet mri is done and patient is asking about that.    PLAN/DISCUSSION: Reviewed clinical history as well as exam results and imaging findings with patient in detail. He is doing well following revision lumbar fusion L2 through pelvis. His primary complaint today is ongoing and worsening neck pain with radiating pain into shoulder and arm. Placed order for MRI cervical spine for further evaluation . Recommend ongoing PT and HEP for lower back.  All questions were answered and Stephen [name] understood and was satisfied with this plan.     FOLLOWUP: Return to clinic: three months. The patient is encouraged to call us with any questions or problems in the interim. Our contact numbers were given to the patient.          Electronically signed by Jennye Moccasin, MD at 1/61/0960 3:49 AM   Office Visit on 04/18/2022     Office Visit on 04/18/2022

## 2022-05-13 NOTE — Telephone Encounter (Signed)
From: Lovena Le  To: [name], MD  Sent: 8/93/4068 4:25 PM PDT  Subject: cervical MRI of neck on 05/20     after the holiday weekend please let me know if anything we can do what is the result of the whole thing/[id number]/ thanks

## 2022-05-17 ENCOUNTER — Telehealth (HOSPITAL_BASED_OUTPATIENT_CLINIC_OR_DEPARTMENT_OTHER): Payer: Self-pay | Admitting: Urology

## 2022-05-17 NOTE — Telephone Encounter (Signed)
Hello,    This patient's chart has been reviewed and needs an APC APP (PA/NP) appt. The APC schedulers ([facility] or [facility]) will schedule the appointment once they have secured a spot and will message you back once the appointment has been made. If no appt slot can be found, pt will be see anesthesia DOS.     Thank you for your patience,  APC Team Members

## 2022-05-17 NOTE — Telephone Encounter (Signed)
Tru Rana            06-Sep-1951                [id number]    APC Triage Tool Visit Result: APC Video  Translation Needed: no  DOS: 06/14  Surgeon: Dr. Sallyanne Kuster  Status (Inpatient, Outpatient, Obs): Outpatient      Patient needs APC appointment 2-5 weeks from DOS. No appointments available.

## 2022-05-18 ENCOUNTER — Other Ambulatory Visit: Payer: Self-pay

## 2022-05-18 NOTE — Telephone Encounter (Signed)
Spoke with pt - aware of APC [place] for tomorrow 06/01

## 2022-05-18 NOTE — Telephone Encounter (Signed)
Patient is now booked for Pre-op Anesthesia consult. Please Check the appointment online.

## 2022-05-19 ENCOUNTER — Other Ambulatory Visit (HOSPITAL_BASED_OUTPATIENT_CLINIC_OR_DEPARTMENT_OTHER): Payer: Self-pay | Admitting: Nurse Practitioner

## 2022-05-19 ENCOUNTER — Telehealth (INDEPENDENT_AMBULATORY_CARE_PROVIDER_SITE_OTHER): Payer: Medicare Other | Admitting: Nurse Practitioner

## 2022-05-19 DIAGNOSIS — Z981 Arthrodesis status: Secondary | ICD-10-CM

## 2022-05-19 DIAGNOSIS — Z01818 Encounter for other preprocedural examination: Secondary | ICD-10-CM

## 2022-05-19 NOTE — Patient Instructions (Signed)
PREOPERATIVE SURGICAL INFORMATION     Your surgery is currently scheduled at [facility] on 6/14  The scheduler will be contacting you with the check in time      [facility], [place], [address], [place], [place] [address]  Please check in at Patient Services in [place] on 1st floor (unless the surgery team gave you different instructions)    [facility] (including [facility]): [employer] structure, Microbiologist structure, or Dance movement psychotherapist parking (7am-5pm at [employer]; 5am-5pm at [employer]) for same cost as self-parking in the front entrance of the [facility]   [email or web address]     QUESTIONS    If you have any questions between now and the day of your surgery, please do not hesitate to call:     [facility]: [phone]     DAY OF SURGERY ARRIVAL TIME:    On the day of your Surgery/Procedure, please arrive at the time provided by the surgery/preop team. If you have any questions regarding your arrival time, please call:    Preoperative Surgical Admissions at [facility]:       Ok to take the following medications as scheduled with a small sip of water on the morning of surgery (or if taken in the evening, please take the night prior): pregabalin, tamsulosin    PLEASE HOLD ALL NSAIDS (non-steroidal anti-inflammatory drugs) SUCH AS advil, aleve, motrin, ibuprofen, relafen, lodine, feldene, Diclofenac, voltaren, indomethacin, naproxen, celebrex, Mobic 7 days before surgery.  Please also hold any medications containing Aspirin.       Please hold vitamins, supplements, herbs & fish oil 7 days before surgery.     It is OK to take acetaminophen (Tylenol) for pain around the time of surgery unless you have liver disease.      AFTER YOUR  VISIT WITH [name], IF YOU START TAKING A NEW MEDICATION BEFORE SURGERY, PLEASE CALL us TO MAKE SURE IT IS SAFE TO TAKE & WILL NOT AFFECT YOUR SURGERY.         TO DO LIST:    Labs to be done prior to surgery: none  -       EATING/DRINKING     DO NOT EAT OR DRINK ANYTHING AFTER MIDNIGHT ON THE DAY OF SURGERY    Preparing for your Surgery:     Please wear clean loose-fitting clothes and leave valuables at home   Do not shave or remove body hair. Facial shaving is permitted. If you are having head surgery, ask your doctor whether you can shave.  Bring a picture ID and your insurance card, and be prepared to pay your deductible or co-insurance by cash, check, or credit card when you arrive.   If you are going home after your surgery, please make sure to arrange for an adult to drive you home. You CANNOT use UBER or LYFT. If you do not have a ride, your surgery may be cancelled.    Visitor policy during the JMEQA-83 pandemic is subject to change. Current visitor policy can be found at [email or web address]  It is advised that you abstain from smoking, vaping, and drug use in the weeks leading up to any procedure     On The Day of Your Surgery:  Check in at the location mentioned above   COVID testing may be done on arrival to the Pre-op or Procedural areas according to the current CDPH mandate.   If you are a woman of child bearing age, please note that you may be asked to give a urine sample upon check-in  You will meet your anesthesia and surgery teams in the preoperative holding area before surgery.   Once surgery is over, you will wake up in the recovery room.  If you go home, an adult chaperone will need to stay with you for the first 24 hours after surgery.   Visitor policy during the VQOHC-09 pandemic is subject to change. Current visitor policy can be found at [email or web address]    A video about what to expect for the day of surgery can be found  here:    [email or web address]  Or by searching "You-tube" for [place] before surgery and [place] after surgery     Your medical records are available to you at [email or web address]

## 2022-05-19 NOTE — Anesthesia Preprocedure Evaluation (Addendum)
ANESTHESIA PRE-OPERATIVE EVALUATION    Patient Information    Name: Stephen [name]    MRN: [id number]    DOB: 1951-11-14    Age: 71 year old    Sex: male  Procedure(s):  Left ureteroscopy, possible biopsy, possible laser fulguration of lesion, possible cytology washings, possible insertion of ureteral stent      Pre-op Vitals:   There were no vitals taken for this visit.        Primary language spoken:  English    ROS/Medical History:      History of Present Illness: Stephen [name] is a 71 year old male with history of bladder cancer, now scheduled for the procedure stated above on 6/14 w/Dr. Sallyanne Kuster '@[facility] Roni Bread    Hx-congenital hydronephrosis s/p right nephrectomy, HTN, chronic back pain, cervical stenosis      General:  negative for General ROS  Uses cane but can walk 2 blocks slowly  BMI 30.3 Cardiovascular:  hypertension (no meds),  EKG 11/2021: SB rate 51     TEE 130/2023  Summary:   1. The left ventricular size is normal. The left ventricular systolic function is normal.   2. The right ventricular size is normal and systolic function is normal.   3. No evidence of any gross valvular vegetations or perivalvular abscess   4. Compared to prior study , no significant change compared with prior TTE    TTE 1/272023  EF 75%  Summary:   1. The left ventricular size is normal. The left ventricular systolic function is normal.   2. Indeterminate left ventricular diastolic function.   3. The right ventricular size is normal and systolic function is normal.   4. Aortic valve sclerosis is present.   5. Mild aortic valve regurgitation observed with color flow Doppler.   6. Dilated aortic root.   7. Pulmonary artery pressure could not be measured because there was insufficient tricuspid regurgitation.   8. No evidence of valvular vegetation noted. Would consider TEE if clinically indicated.   9. No previous study for comparison.    Denies any recent chest pain, shortness of breath, dizziness, palpitations, lower extremity  edema, orthopnea, PND.   fair exercise tolerance, limited by low back pain, denies exertional CP or sig DOE     Anesthesia History:  history of difficult intubation,  PONV,  chronic pain patient (Oxycodone as needed , not daily),  Lumbar surgery 11/08/21; Placement Time 1346; Placed By CRNA (specify); Insertion Attempts 1; Laryngoscope View Grade 1; Laryngoscopy Technique Glidescope (size 4); Size (mm) 7.0; Placement Verified By Auscultation, Capnometry; Removal Date 11/08/21  G3V required bougie with DL  Easy Glide  Placement Date 12/24/21; Placement Time 1001; Placed By Anesthesia, In OR, CRNA (specify); Insertion Attempts 1; Airway Device LMA; Size (mm) 4.0; Pulmonary:   no recent URI/pneumonia,  CTA Pulmonary Embolus  Result Date: 12/02/2021  IMPRESSION: 1. No evidence of pulmonary embolism. 2. Multifocal pneumonia. 3. Ectatic ascending aorta. 4. Mild amount of coronary artery calcifications. 5. Few additional findings above.     X-Ray Chest Single View  Result Date: 12/02/2021  IMPRESSION: Increased bibasal opacities of atelectasis/pneumonia with possible right pleural effusion.      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                      Most  recent labs 4/20 in RR (and below).  -    ---------------------------------------------------  2 points of identification verification completed   Reviewed AVS and sent to patient's email/MyChart                 Physical Exam    Airway:    Inter-inciser distance > 4 cm  Prognanth Unable    Mallampati: II  Neck ROM: full  TM distance: > 6 cm  Short thick neck: No          Cardiovascular:  - cardiovascular exam normal         Pulmonary:  - pulmonary exam normal           Neuro/Neck/Skeletal/Skin:      Dental:  - normal exam      Abdominal:      General: obesity     Additional Clinical Notes:               Last  OSA (STOP BANG) Score:    Has a physician diagnosed you with sleep apnea?: No  Do you snore loudly (loud enough to be heard through a closed door)?: No  Do you often feel tired, fatigued or sleepy during the day?: No  Has anyone observed that you stop breathing while you are sleeping?: No  Have you ever been treated for high blood pressure?: Yes  OSA total score (A score of 2 or more is high risk. Offer patient sleep study.): 1       Past Medical History:   Diagnosis Date   . Chronic back pain    . Congenital hydronephrosis    . Gout    . Headache    . Hematuria    . HTN (hypertension) 12/10/2021   . Kidney disease    . Kidney stones    . Major depressive disorder, single episode    . Polyarthropathy or polyarthritis of multiple sites    . Retinal detachment    . Urethral stricture      Past Surgical History:   Procedure Laterality Date   . CT INSERTION OF SUPRAPUBIC CATH  09/25/2015   . NEPHRECTOMY Right 1995   . APPENDECTOMY     . COLONOSCOPY     . CYSTOSCOPY     . CYSTOSCOPY W/ LASER LITHOTRIPSY     . OTHER SURGICAL HISTORY      Interstim 01/29/2011   . SPINE SURGERY  09/21    Lumbar-sacral fusion   . TRANSURETHRAL RESECTION OF PROSTATE       Social History     Socioeconomic History   . Marital status: Single   Tobacco Use   . Smoking status: Never   . Smokeless tobacco: Never   Vaping Use   . Vaping Use:  Never used   Substance and Sexual Activity   .  Alcohol use: No   . Drug use: Not Currently   . Sexual activity: Not Currently     Partners: Female   Other Topics Concern   . Military Service No   . Blood Transfusions Yes   . Caffeine Concern No   . Occupational Exposure No   . Hobby Hazards No   . Sleep Concern Yes     Comment: due to my lower back discomfort and leg discomfort   . Stress Concern Yes     Comment: Health/ housing/ financial   . Weight Concern No   . Special Diet No   . Back Care Yes     Comment: Am very careful with any activity   . Exercises Regularly Yes   . Seat Belt Use Yes   . Performs Self-Exams Yes   . Bike Helmet Use No     Alcohol Use: Not on file       Current Outpatient Medications   Medication Sig Dispense Refill   . acetaminophen (TYLENOL) 325 MG tablet Take 2 tablets (650 mg) by mouth every 6 hours as needed for Mild Pain (Pain Score 1-3) (alternate with motrin). 30 tablet 0   . albuterol 108 (90 Base) MCG/ACT inhaler Inhale 2 puffs by mouth every 4 hours as needed for Wheezing. 6.7 g 0   . allopurinol (ZYLOPRIM) 100 MG tablet Take 1 tablet (100 mg) by mouth daily. 30 tablet 0   . aluminum-magnesium-simethicone (MAG-AL PLUS) 400-867-61 MG/5ML suspension Take 30 mL by mouth every 6 hours as needed for Heartburn. 240 mL 0   . ascorbic acid (VITAMIN C) 500 MG tablet Take 1 tablet (500 mg) by mouth daily.     . bisacodyl (DULCOLAX) 10 MG suppository Insert 1 suppository (10 mg) rectally daily as needed for Constipation. 10 suppository 0   . ciclopirox (LOPROX) 0.77 % GEL Apply topically 2 times daily.     . diazepam (VALIUM) 5 MG tablet Take 1 tablet (5 mg) by mouth every 6 hours as needed for Anxiety (Take 30-60 minutes prior to MRI.  You will need a driver). 1 tablet 0   . docusate sodium (COLACE) 250 MG capsule Take 1 capsule (250 mg) by mouth 2 times daily. 60 capsule 0   . DULoxetine (CYMBALTA) 20 MG CR capsule Take 1 capsule (20 mg) by mouth daily. 90 capsule 3   . heparin 100 UNIT/ML  injection 1 mL (100 Units) by IntraCATHETER route every 6 hours as needed (PICC care). 30 each 0   . heparin 5000 UNIT/ML injection Inject 1 mL (5,000 Units) under the skin every 8 hours. 90 each 0   . ketotifen (ALAWAY) 0.025 % ophthalmic solution Place 1 drop into both eyes 2 times daily.     Marland Kitchen lidocaine (ASPERCREME) 4 % patch Apply 1 patch topically every 24 hours. Leave patch on for 12 hours, then remove for 12 hours. 30 patch 0   . Loratadine (ALAVERT PO) Take 10 mg by mouth daily.     Marland Kitchen LORazepam (ATIVAN) 1 MG tablet Place 1/2 tab Sublingual 30 minutes prior to procedure, may repeat once. Do not exceed 1 tablet 1 tablet 0   . melatonin 5 MG tablet Take 1 tablet (5 mg) by mouth at bedtime. 30 tablet 0   . methylPREDNISolone (MEDROL DOSEPACK) 4 MG tablet Take as directed on package 1 each 0   . Multiple Vitamins-Minerals (MULTIVITAMIN WITH MINERALS) TABS tablet Take 1 tablet by mouth daily. 30 tablet 1   .  naloxone (NARCAN) 4 mg/0.1 mL nasal spray For suspected opioid overdose, call 911! Then spray once in one nostril. Repeat after 3 minutes if no or minimal response using a new spray in other nostril. 2 each 0   . nitrofurantoin monohydrate (MACROBID) 100 MG capsule Take 1 capsule (100 mg) by mouth 2 times daily. 20 capsule 0   . ondansetron (ZOFRAN ODT) 8 MG disintegrating tablet Take 1 tablet (8 mg) on or under the tongue every 8 hours as needed for Nausea/Vomiting. 10 tablet 0   . oxyCODONE (ROXICODONE) 5 MG immediate release tablet Take 1 tablet (5 mg) by mouth 2 times daily as needed for Moderate Pain (Pain Score 4-6) or Severe Pain (Pain Score 7-10). 30 tablet 0   . Polyethyl Glycol-Propyl Glycol (SYSTANE ULTRA OP)      . polyethylene glycol (GLYCOLAX) 17 GM/SCOOP powder Take 17 g by mouth daily. Mix with 4 to 8 ounces of fluid (water, juice, soda, coffee, or tea) prior to administration. 255 g 0   . pregabalin (LYRICA) 100 MG capsule TAKE 1 CAPSULE BY MOUTH EVERYDAY AT BEDTIME 30 capsule 0   . senna  (SENOKOT) 8.6 MG tablet Take 1 tablet (8.6 mg) by mouth daily. 30 tablet 0   . sodium chloride 0.9 % flush 10 mL by IntraCATHETER route every 8 hours. 90 each 0   . tamsulosin (FLOMAX) 0.4 MG capsule TAKE 1 CAPSULE BY MOUTH EVERY DAY 30 capsule 3   . tizanidine (ZANAFLEX) 2 MG tablet Take 1 tablet (2 mg) by mouth every 8 hours as needed (spasm). (Patient taking differently: Take 2 tablets (4 mg) by mouth every 8 hours as needed (spasm).) 40 tablet 0   . traZODone (DESYREL) 50 MG tablet Take 0.5 tablets (25 mg) by mouth at bedtime. 30 tablet 0   . triamcinolone (KENALOG) 0.1 % cream Apply 1 Application. topically 2 times daily. Apply a thin layer as directed     . VITAMIN D PO Take 125 mg by mouth daily.       No current facility-administered medications for this visit.     Allergies   Allergen Reactions   . Sulfa Drugs Unspecified       Labs and Other Data  Lab Results   Component Value Date    NA 141 04/07/2022    K 4.5 04/07/2022    CL 104 04/07/2022    BICARB 25 04/07/2022    BUN 23 04/07/2022    CREAT 1.21 (H) 04/07/2022    GLU 112 (H) 04/07/2022    [place] 9.2 04/07/2022     Lab Results   Component Value Date    AST 16 01/19/2022    ALT 25 01/19/2022    GGT 131 (H) 01/17/2022    LDH 172 01/19/2022    ALK 129 01/19/2022    TP 5.4 (L) 01/19/2022    ALB 4.5 04/07/2022    TBILI 0.17 01/19/2022    DBILI <0.2 01/19/2022     Lab Results   Component Value Date    WBC 5.4 04/07/2022    RBC 4.66 04/07/2022    HGB 13.3 (L) 04/07/2022    HCT 40.7 04/07/2022    MCV 87.3 04/07/2022    MCHC 32.7 04/07/2022    RDW 15.9 (H) 04/07/2022    PLT 221 04/07/2022    MPV 10.4 04/07/2022    SEG 50 04/07/2022    LYMPHS 36 04/07/2022    MONOS 10 04/07/2022    EOS 2 04/07/2022  BASOS 1 04/07/2022     Lab Results   Component Value Date    INR 1.1 01/17/2022    PTT 31 01/11/2022     Lab Results   Component Value Date    ARTPH 7.48 (H) 11/08/2021    ARTPO2 213 (H) 11/08/2021    ARTPCO2 32 (L) 11/08/2021       Anesthesia Plan:  Risks and Benefits  of Anesthesia  I have personally performed an appropriate pre-anesthesia physical exam of the patient (including heart, lungs, and airway) prior to the anesthetic and reviewed the pertinent medical history, drug and allergy history, laboratory and imaging studies and consultations.   I have determined that the patient has had adequate assessment and testing.  I have validated the documentation of these elements of the patient exam and/or have made necessary changes to reflect my own observations during my pre-anesthesia exam.  Anesthetic techniques, invasive monitors, anesthetic drugs for induction, maintenance and post-operative analgesia, risks and alternatives have been explained to the patient and/or patient's representatives.    I have prescribed the anesthetic plan:         Planned anesthesia method: General         ASA 3 (Severe systemic disease)     Potential anesthesia problems identified and risks including but not limited to the following were discussed with patient and/or patient's representative: Adverse or allergic drug reaction, Dental injury or sore throat, Recall, Ocular injury, Nerve injury and Injury to brain, heart and other organs    No Beta Blocker Indicated: Does not meet criteria and Patient not on beta blockers    Planned monitoring method: Routine monitoring  Comments: (Patient chart and past medical history reviewed. Discussed risks, benefits, and alternatives of general anesthesia and endotracheal intubation with patient, with risks including sore throat, dental damage, pneumonia, heart attack, stroke, and death. Patient verbalized understanding. All questions and concerns addressed. Patient elected to proceed with general anesthesia.   )    Informed Consent:  Anesthetic plan and risks discussed with Patient.    Plan discussed with CRNA, Attending, Surgeon and OR Nurse.

## 2022-05-20 MED ORDER — PREGABALIN 100 MG OR CAPS
ORAL_CAPSULE | ORAL | 0 refills | Status: DC
Start: 2022-05-20 — End: 2022-06-26

## 2022-05-20 NOTE — Telephone Encounter (Signed)
Lyrica refilled to pharmacy.

## 2022-05-23 ENCOUNTER — Encounter (HOSPITAL_BASED_OUTPATIENT_CLINIC_OR_DEPARTMENT_OTHER): Payer: Self-pay | Admitting: Urology

## 2022-05-24 NOTE — Telephone Encounter (Signed)
From: Lovena Le  To: Carlota Raspberry, MD  Sent: 05/23/2022 7:46 PM PDT  Subject: Antibiotic order from my GP    got a urine sample last week for upcoming surgery this one was not good do I need to start this antibiotic (7day)? YN#[id number]

## 2022-05-25 ENCOUNTER — Encounter (HOSPITAL_BASED_OUTPATIENT_CLINIC_OR_DEPARTMENT_OTHER): Payer: Self-pay | Admitting: Urology

## 2022-05-25 NOTE — Telephone Encounter (Signed)
Tc to [facility] at [phone] regarding pt labs.  Hold time over 45 min. Disconnected call.    Faxed records request to [phone].

## 2022-05-26 ENCOUNTER — Encounter (HOSPITAL_BASED_OUTPATIENT_CLINIC_OR_DEPARTMENT_OTHER): Payer: Self-pay | Admitting: Urology

## 2022-05-26 ENCOUNTER — Encounter (HOSPITAL_BASED_OUTPATIENT_CLINIC_OR_DEPARTMENT_OTHER): Payer: Self-pay | Admitting: Nurse Practitioner

## 2022-05-26 ENCOUNTER — Ambulatory Visit: Payer: Medicare Other | Admitting: Nurse Practitioner

## 2022-05-26 DIAGNOSIS — M4802 Spinal stenosis, cervical region: Secondary | ICD-10-CM

## 2022-05-26 NOTE — Progress Notes (Signed)
ORTHOPAEDIC SPINE SURGERY     Visit Type: Office Visit    Reason For Visit: Surgical Follow-up (Lumbar fusion)    Surgery Date: 11/08/2021    Procedure: XLIF L2-3 and PSIF L2 thru pelvis     History Of Present Illness: 71 year old male returns to spine clinic for follow up evaluation now approximately 6 months s/p revision lumbar decompression and fusion L2 thru pelvis. He states that he is doing well although has had ongoing complaints of generalized back stiffness and intermittent pain, which is well controlled. He had completed a course of PT, although has had several medical issues. 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 His primary complaint today is neck pain and stiffness with associated pain and paresthesias extending into his upper extremities and shoulder.  Patient rates his pain as  . Patient reports no changes in bowel and bladder function. Patient returns for follow up.    Interval History: patient states he continues to have neck pain and shoulder and arm pain, especially in the left arm. On occasion his left arm feels weak. He is currently going through the last chemo treatment. He is interested in a cervical injection.     Allergies:   Allergies   Allergen Reactions   . Sulfa Drugs Unspecified       Medications:   Current Outpatient Medications   Medication Sig   . acetaminophen (TYLENOL) 325 MG tablet Take 2 tablets (650 mg) by mouth every 6 hours as needed for Mild Pain (Pain Score 1-3) (alternate with motrin).   Marland Kitchen albuterol 108 (90 Base) MCG/ACT inhaler Inhale 2 puffs by mouth every 4 hours as needed for Wheezing.   Marland Kitchen allopurinol (ZYLOPRIM) 100 MG tablet Take 1 tablet (100 mg) by mouth daily.   Marland Kitchen aluminum-magnesium-simethicone (MAG-AL PLUS) 200-200-20 MG/5ML suspension Take 30 mL by mouth every 6 hours as needed for Heartburn.   Marland Kitchen ascorbic acid (VITAMIN C) 500 MG tablet Take 1 tablet (500 mg) by mouth daily.   . bisacodyl (DULCOLAX)  10 MG suppository Insert 1 suppository (10 mg) rectally daily as needed for Constipation.   . ciclopirox (LOPROX) 0.77 % GEL Apply topically 2 times daily.   . diazepam (VALIUM) 5 MG tablet Take 1 tablet (5 mg) by mouth every 6 hours as needed for Anxiety (Take 30-60 minutes prior to MRI.  You will need a driver).   Marland Kitchen docusate sodium (COLACE) 250 MG capsule Take 1 capsule (250 mg) by mouth 2 times daily.   . DULoxetine (CYMBALTA) 20 MG CR capsule Take 1 capsule (20 mg) by mouth daily.   . heparin 100 UNIT/ML injection 1 mL (100 Units) by IntraCATHETER route every 6 hours as needed (PICC care).   . heparin 5000 UNIT/ML injection Inject 1 mL (5,000 Units) under the skin every 8 hours.   Marland Kitchen ketotifen (ALAWAY) 0.025 % ophthalmic solution Place 1 drop into both eyes 2 times daily.   Marland Kitchen lidocaine (ASPERCREME) 4 % patch Apply 1 patch topically every 24 hours. Leave patch on for 12 hours, then remove for 12 hours.   . Loratadine (ALAVERT PO) Take 10 mg by mouth daily.   Marland Kitchen LORazepam (ATIVAN) 1 MG tablet Place 1/2 tab Sublingual 30 minutes prior to procedure, may repeat once. Do not exceed 1 tablet   . melatonin 5 MG tablet Take 1 tablet (5 mg) by mouth at bedtime.   . methylPREDNISolone (MEDROL DOSEPACK) 4 MG tablet Take as directed  on package   . Multiple Vitamins-Minerals (MULTIVITAMIN WITH MINERALS) TABS tablet Take 1 tablet by mouth daily.   . naloxone (NARCAN) 4 mg/0.1 mL nasal spray For suspected opioid overdose, call 911! Then spray once in one nostril. Repeat after 3 minutes if no or minimal response using a new spray in other nostril.   . nitrofurantoin monohydrate (MACROBID) 100 MG capsule Take 1 capsule (100 mg) by mouth 2 times daily.   . ondansetron (ZOFRAN ODT) 8 MG disintegrating tablet Take 1 tablet (8 mg) on or under the tongue every 8 hours as needed for Nausea/Vomiting.   Marland Kitchen oxyCODONE (ROXICODONE) 5 MG immediate release tablet Take 1 tablet (5 mg) by mouth 2 times daily as needed for Moderate Pain (Pain Score  4-6) or Severe Pain (Pain Score 7-10).   [name] Glycol-Propyl Glycol (SYSTANE ULTRA OP)    . polyethylene glycol (GLYCOLAX) 17 GM/SCOOP powder Take 17 g by mouth daily. Mix with 4 to 8 ounces of fluid (water, juice, soda, coffee, or tea) prior to administration.   . pregabalin (LYRICA) 100 MG capsule TAKE 1 CAPSULE BY MOUTH EVERYDAY AT BEDTIME   . senna (SENOKOT) 8.6 MG tablet Take 1 tablet (8.6 mg) by mouth daily.   . sodium chloride 0.9 % flush 10 mL by IntraCATHETER route every 8 hours.   . tamsulosin (FLOMAX) 0.4 MG capsule TAKE 1 CAPSULE BY MOUTH EVERY DAY   . tizanidine (ZANAFLEX) 2 MG tablet Take 1 tablet (2 mg) by mouth every 8 hours as needed (spasm). (Patient taking differently: Take 2 tablets (4 mg) by mouth every 8 hours as needed (spasm).)   . traZODone (DESYREL) 50 MG tablet Take 0.5 tablets (25 mg) by mouth at bedtime.   . triamcinolone (KENALOG) 0.1 % cream Apply 1 Application. topically 2 times daily. Apply a thin layer as directed   . VITAMIN D PO Take 125 mg by mouth daily.     No current facility-administered medications for this visit.         Review of Systems:  CONSTITUTIONAL: No unexpected weight loss, fevers, chills, or anorexia. SKIN: Noncontributory. GENITOURINARY: Symptoms as noted above in HPI. NEUROLOGIC: Symptoms as noted above in HPI. MUSCULOSKELETAL: Symptoms as noted above in HPI.    Physical Examination:  There were no vitals taken for this visit.        IMAGING STUDIES:    IMPRESSION:   1. 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 No visible spinal cord edema. Postcontrast cervical spine MRI is recommended to exclude an associated enhancing process which may alter management.   2. Degenerative changes also result in severe foraminal stenosis on the left at C3-C4, C4-C5, bilaterally at C5-C6, and on the right at C6-C7,  with expected mass effect on the respective exiting nerve roots. Additional moderate degenerative foraminal stenosis on the left at C6-C7 and on the right at C7-T1 may also be symptomatic.   3. Fibrovascular degenerative endplate changes at P8-K9 and C7-T1 may contribute to local symptoms.    OTHER MEDICAL RECORDS/IMAGING STUDIES REVIEWED: None    IMPRESSION:   No diagnosis found.     PLAN/DISCUSSION:  Discussed the findings of the MRI with the patient. Contacted Dr. Ruthine Dose , ok to proceed with MRI with contrast. Once completed patient will follow up to discuss results and if clear can proceed with  possible cESI.    FOLLOWUP: Return to clinic: post MRI. The patient is encouraged to call us with any questions or problems in the interim. Our contact numbers were given to the patient.    ---------------------(data below generated by Sheria Lang, NP)--------------------     Patient Verification & Telemedicine Consent & Financial Waiver:    1.   Identity: I have verified this patient's identity to be accurate.  2.   Consent: I verify consent has been secured in one of the following methods: (a) obtained written/ online attestation consent (via MyChartVideoVisit pathway), (b) the spoke-side provider has obtained verbal or written consent from patient/surrogate (if this is a "provider to provider" evaluation), or (c) in all other cases, I have personally obtained verbal consent from the patient/ surrogate (noting all elements below) to perform this voluntary telemedicine evaluation (including obtaining history, performing examination and reviewing data provided by the patient).   The patient/ surrogate has the right to refuse this evaluation.  I have explained risks (including potential loss of confidentiality), benefits, alternatives, and the potential need for subsequent face to face care. Patient/ surrogate understands that there is a risk of medical inaccuracies given that our recommendations will be made based  on reported data (and we must therefore assume this information is accurate).  Knowing that there is a risk that this information is not reported accurately, and that the telemedicine video, audio, or data feed may be incomplete, the patient agrees to proceed with evaluation and holds [name] harmless knowing these risks.  3.   Healthcare Team: The patient/ surrogate has been notified that other healthcare professionals (including students, residents and Metallurgist) may be involved in this audio-video evaluation.   All laws concerning confidentiality and patient access to medical records and copies of medical records apply to telemedicine.  4.   Privacy: If this is a Radiographer, therapeutic Visit, the patient/ surrogate has received the [place] Notice of Privacy Practices via E-Checkin process.  For all other video visit techniques, I have verbally provided the patient/ surrogate with the [place] in [place] ([email or web address]) or Spanish ([email or web address]://[email or web address]://[email or web address].[email or web address]).  The patient/ surrogate acknowledges both being provided the NPP link, and has been offered to have the NPP mailed to the patient/ surrogate by [name] mail.  The patient/ surrogate has voiced understanding an acknowledgement of receipt of this NPP web address.  If the patient/surrogate has elected to receive the NPP via [name] mail, I verify that the NPP will be sent promptly to the patient/surrogate via [name] mail.  5.   Capacity: I have reviewed this above verification and consent paragraph with the patient/ surrogate and the patient is capacitated or has a surrogate. If the patient is not capacitated to understand the above, and no surrogate is available, since this is not an emergency evaluation, the visit will be rescheduled until such time that the patient can consent, or the surrogate is available to consent. If this is an emergency evaluation and the patient is not capacitated to understand the above, and no  surrogate is available, I am proceeding with this evaluation as this is felt to be an emergency setting and no appropriate specialist is available at the bedside to perform these evaluations.  6.   Financial Waiver: If this is a Radiographer, therapeutic Visit, the patient has been made aware of the financial waiver via E-Checkin process.  For all other video visit techniques, an E-Checkin process is not performed.  As such, I have personally verbally informed  the patient/ surrogate that this evaluation will be a billable encounter similar to an in-person clinic visit, and the patient/ surrogate has agreed to pay the fee for services rendered.  If we are billing insurance for the patient's telehealth visit, his out-of-pocket cost will be determined based on his plan and will be billed to him.  The patient/ surrogate has also been informed that if the patient does not have insurance or does not wish to use insurance, [place] [employer] price for a primary care telehealth visit is $59.00 and specialist telehealth visit is $88.00.  I have further informed the patient/ surrogate that in the event the patient has additional services provided in conjunction with the specialty visit (Ex. Psychotherapy services), those services will be billed at the current rate less a 45% discount.  7.   Intra-State Location: The patient/ surrogate attests to understanding that if the patient accesses these services from a location outside of [place], that the patient does so at the patient's own risk and initiative and that the patient is ultimately responsible for compliance with any laws or regulations associated with the patient's use.  8.   Specific Use:The patient/ surrogate understands that [facility] makes no representation that materials or servicesdelivered via telecommunication services, or listed on telemedicine websites, are appropriate or available for use in any other location.           Demographics:  Medical Record #:  [id number]  Date: May 26, 2022  Patient Name: Stephen [name]  DOB: October 03, 1951  Age: 71 year old  Sex: male  Location: Home address on file  Patient seen Status: Patient was evaluated via telemedicine (audio or audio/video)     Evaluator(s):  Jac Romulus was evaluated by me today.    Clinic Location: [facility]  [address]  [place] [address]      Time spend reviewing chart, outside records, communication and imaging studies in preparation for visit, face-to- face time in person or virtually ,counseling, completing orders, and documenting encounter: 30 minutes

## 2022-05-26 NOTE — Telephone Encounter (Signed)
05/26/22 '@11'$ :10 am no results received from [facility].

## 2022-05-26 NOTE — Telephone Encounter (Signed)
From: Lovena Le  To: Carlota Raspberry, MD  Sent: 05/26/2022 10:27 AM PDT  Subject: fax labs from [facility]    I hope you have finally received the labs/MR#[id number]

## 2022-05-27 ENCOUNTER — Other Ambulatory Visit: Payer: Self-pay | Admitting: Cardiovascular Disease

## 2022-05-27 ENCOUNTER — Encounter (HOSPITAL_BASED_OUTPATIENT_CLINIC_OR_DEPARTMENT_OTHER): Payer: Self-pay | Admitting: Urology

## 2022-05-27 DIAGNOSIS — R339 Retention of urine, unspecified: Secondary | ICD-10-CM

## 2022-05-27 NOTE — Telephone Encounter (Signed)
Pended Macrobid, will route to MD.

## 2022-05-27 NOTE — Telephone Encounter (Signed)
Results received and scanned into media.

## 2022-05-27 NOTE — Telephone Encounter (Signed)
Tc to [facility] at [phone] 2nd attempt. Spoke with Yunuen.   msg was routed at [phone] to referral cord to fax labs at number that was provided of [phone].  Provided [phone]. Once labs received will scan and upload to media.

## 2022-05-30 ENCOUNTER — Encounter (HOSPITAL_BASED_OUTPATIENT_CLINIC_OR_DEPARTMENT_OTHER): Payer: Self-pay | Admitting: Urology

## 2022-05-30 MED ORDER — NITROFURANTOIN MONOHYD MACRO 100 MG OR CAPS
100.0000 mg | ORAL_CAPSULE | Freq: Two times a day (BID) | ORAL | 0 refills | Status: AC
Start: 2022-05-30 — End: 2022-06-06

## 2022-05-30 NOTE — Telephone Encounter (Addendum)
Hi Dr. Sallyanne Kuster,  Pt has Left URS, possible bx, possible laser fulg 06/01/22  Pre-op urine culture is + for infection  Macrobid was pended  Please review/ sign if appropriate - he is already taking Macrobid empirically pending culture - care to extend to cover through surgery date?

## 2022-05-31 ENCOUNTER — Other Ambulatory Visit (HOSPITAL_BASED_OUTPATIENT_CLINIC_OR_DEPARTMENT_OTHER): Payer: Self-pay | Admitting: Nurse Practitioner

## 2022-05-31 ENCOUNTER — Encounter (HOSPITAL_BASED_OUTPATIENT_CLINIC_OR_DEPARTMENT_OTHER): Payer: Self-pay | Admitting: Nurse Practitioner

## 2022-05-31 ENCOUNTER — Other Ambulatory Visit (HOSPITAL_BASED_OUTPATIENT_CLINIC_OR_DEPARTMENT_OTHER): Payer: Self-pay | Admitting: Urology

## 2022-05-31 DIAGNOSIS — F419 Anxiety disorder, unspecified: Secondary | ICD-10-CM

## 2022-05-31 DIAGNOSIS — Z981 Arthrodesis status: Secondary | ICD-10-CM

## 2022-05-31 DIAGNOSIS — R52 Pain, unspecified: Secondary | ICD-10-CM

## 2022-05-31 MED ORDER — LORAZEPAM 1 MG OR TABS
1.0000 mg | ORAL_TABLET | ORAL | 0 refills | Status: DC
Start: 2022-05-31 — End: 2022-06-01

## 2022-05-31 NOTE — Telephone Encounter (Signed)
Closing Encounter- Routing to [employer]

## 2022-05-31 NOTE — Telephone Encounter (Signed)
Message received via [facility].    Stephen [name] "[name]"  P [facility] (supporting Sheria Lang, NP) 4 hours ago (7:13 AM)     DW  please order me something to relax me during my MRI on June 17 /MR#[id number]/ thank you

## 2022-05-31 NOTE — Telephone Encounter (Signed)
From: Lovena Le  To: Sheria Lang, NP  Sent: 05/31/2022 7:13 AM PDT  Subject: medication for my MRI    please order me something to relax me during my MRI on June 17 /MR#[id number]/ thank you

## 2022-05-31 NOTE — Telephone Encounter (Signed)
Filled Ativan 1 mg one tablet.

## 2022-05-31 NOTE — Telephone Encounter (Signed)
Routed to Renato Gails NP    T'd up Ativan 1 mg to take one hour prior to MRI , can repeat once if continuing to have claustrophobia (this is higher then last time, patient states Ativan 0.5 mg was ineffective )       MyChart Message       RN called and spoke with patient   States that Ativan 0.5 mg and Valium 1 mg is ineffective for treating his claustrophobia  Requesting Ativan and if it can be stronger then 0.5 mg   Patient has a ride to and from MRI   Confirmed pharmacy - [facility] [address]

## 2022-06-01 ENCOUNTER — Ambulatory Visit (HOSPITAL_COMMUNITY): Payer: Medicare Other | Admitting: Nurse Practitioner

## 2022-06-01 ENCOUNTER — Ambulatory Visit (HOSPITAL_BASED_OUTPATIENT_CLINIC_OR_DEPARTMENT_OTHER): Payer: Medicare Other | Admitting: Anesthesiology

## 2022-06-01 ENCOUNTER — Ambulatory Visit
Admission: RE | Admit: 2022-06-01 | Discharge: 2022-06-01 | Disposition: A | Discharge status: Home / Self Care | Payer: Medicare Other | Attending: Urology | Admitting: Urology

## 2022-06-01 ENCOUNTER — Encounter (HOSPITAL_COMMUNITY): Admission: RE | Disposition: A | Discharge status: Home Health | Payer: Self-pay | Attending: Urology

## 2022-06-01 ENCOUNTER — Ambulatory Visit (HOSPITAL_BASED_OUTPATIENT_CLINIC_OR_DEPARTMENT_OTHER): Payer: Medicare Other

## 2022-06-01 DIAGNOSIS — Z936 Other artificial openings of urinary tract status: Secondary | ICD-10-CM | POA: Insufficient documentation

## 2022-06-01 DIAGNOSIS — Z85528 Personal history of other malignant neoplasm of kidney: Secondary | ICD-10-CM | POA: Insufficient documentation

## 2022-06-01 DIAGNOSIS — M47817 Spondylosis without myelopathy or radiculopathy, lumbosacral region: Secondary | ICD-10-CM | POA: Insufficient documentation

## 2022-06-01 DIAGNOSIS — Z8554 Personal history of malignant neoplasm of ureter: Secondary | ICD-10-CM | POA: Insufficient documentation

## 2022-06-01 DIAGNOSIS — Z981 Arthrodesis status: Secondary | ICD-10-CM | POA: Insufficient documentation

## 2022-06-01 DIAGNOSIS — Z87440 Personal history of urinary (tract) infections: Secondary | ICD-10-CM | POA: Insufficient documentation

## 2022-06-01 DIAGNOSIS — M48062 Spinal stenosis, lumbar region with neurogenic claudication: Secondary | ICD-10-CM | POA: Insufficient documentation

## 2022-06-01 DIAGNOSIS — Z882 Allergy status to sulfonamides status: Secondary | ICD-10-CM | POA: Insufficient documentation

## 2022-06-01 DIAGNOSIS — M4722 Other spondylosis with radiculopathy, cervical region: Secondary | ICD-10-CM | POA: Insufficient documentation

## 2022-06-01 DIAGNOSIS — Z79899 Other long term (current) drug therapy: Secondary | ICD-10-CM | POA: Insufficient documentation

## 2022-06-01 DIAGNOSIS — Z9049 Acquired absence of other specified parts of digestive tract: Secondary | ICD-10-CM | POA: Insufficient documentation

## 2022-06-01 DIAGNOSIS — N3000 Acute cystitis without hematuria: Secondary | ICD-10-CM

## 2022-06-01 DIAGNOSIS — N323 Diverticulum of bladder: Secondary | ICD-10-CM | POA: Insufficient documentation

## 2022-06-01 DIAGNOSIS — N3021 Other chronic cystitis with hematuria: Secondary | ICD-10-CM | POA: Insufficient documentation

## 2022-06-01 DIAGNOSIS — Z8551 Personal history of malignant neoplasm of bladder: Secondary | ICD-10-CM | POA: Insufficient documentation

## 2022-06-01 DIAGNOSIS — M5416 Radiculopathy, lumbar region: Secondary | ICD-10-CM | POA: Insufficient documentation

## 2022-06-01 DIAGNOSIS — G8929 Other chronic pain: Secondary | ICD-10-CM | POA: Insufficient documentation

## 2022-06-01 DIAGNOSIS — M109 Gout, unspecified: Secondary | ICD-10-CM | POA: Insufficient documentation

## 2022-06-01 DIAGNOSIS — F329 Major depressive disorder, single episode, unspecified: Secondary | ICD-10-CM | POA: Insufficient documentation

## 2022-06-01 DIAGNOSIS — Z905 Acquired absence of kidney: Secondary | ICD-10-CM

## 2022-06-01 DIAGNOSIS — Z9221 Personal history of antineoplastic chemotherapy: Secondary | ICD-10-CM | POA: Insufficient documentation

## 2022-06-01 DIAGNOSIS — R52 Pain, unspecified: Secondary | ICD-10-CM

## 2022-06-01 DIAGNOSIS — N135 Crossing vessel and stricture of ureter without hydronephrosis: Secondary | ICD-10-CM | POA: Insufficient documentation

## 2022-06-01 DIAGNOSIS — Z8616 Personal history of COVID-19: Secondary | ICD-10-CM | POA: Insufficient documentation

## 2022-06-01 DIAGNOSIS — C679 Malignant neoplasm of bladder, unspecified: Secondary | ICD-10-CM

## 2022-06-01 DIAGNOSIS — Z87442 Personal history of urinary calculi: Secondary | ICD-10-CM | POA: Insufficient documentation

## 2022-06-01 DIAGNOSIS — Z9889 Other specified postprocedural states: Secondary | ICD-10-CM | POA: Insufficient documentation

## 2022-06-01 DIAGNOSIS — N2889 Other specified disorders of kidney and ureter: Secondary | ICD-10-CM | POA: Insufficient documentation

## 2022-06-01 DIAGNOSIS — R9341 Abnormal radiologic findings on diagnostic imaging of renal pelvis, ureter, or bladder: Secondary | ICD-10-CM | POA: Insufficient documentation

## 2022-06-01 DIAGNOSIS — N320 Bladder-neck obstruction: Secondary | ICD-10-CM | POA: Insufficient documentation

## 2022-06-01 DIAGNOSIS — N3020 Other chronic cystitis without hematuria: Secondary | ICD-10-CM

## 2022-06-01 DIAGNOSIS — M4317 Spondylolisthesis, lumbosacral region: Secondary | ICD-10-CM | POA: Insufficient documentation

## 2022-06-01 DIAGNOSIS — I10 Essential (primary) hypertension: Secondary | ICD-10-CM | POA: Insufficient documentation

## 2022-06-01 SURGERY — URETEROSCOPY, NON-STONE
Anesthesia: General | Site: Ureter | Laterality: Left | Wound class: Class II (Clean Contaminated)

## 2022-06-01 MED ORDER — IODIXANOL 320 MG/ML IV SOLN
INTRAVENOUS | Status: AC
Start: 2022-06-01 — End: 2022-06-01
  Filled 2022-06-01: qty 100

## 2022-06-01 MED ORDER — ACETAMINOPHEN 500 MG OR TABS
1000.00 mg | ORAL_TABLET | Freq: Three times a day (TID) | ORAL | 0 refills | Status: AC
Start: 2022-06-01 — End: 2022-06-08

## 2022-06-01 MED ORDER — LABETALOL HCL 5 MG/ML IV SOLN
5.0000 mg | INTRAVENOUS | Status: DC | PRN
Start: 2022-06-01 — End: 2022-06-01

## 2022-06-01 MED ORDER — IODIXANOL 320 MG/ML IV SOLN
INTRAVENOUS | Status: DC | PRN
Start: 2022-06-01 — End: 2022-06-01
  Administered 2022-06-01: 10 mL

## 2022-06-01 MED ORDER — LACTATED RINGERS IV SOLN
INTRAVENOUS | Status: DC | PRN
Start: 2022-06-01 — End: 2022-06-01

## 2022-06-01 MED ORDER — AMPICILLIN SODIUM 1 GM IV SOLR
INTRAVENOUS | Status: DC | PRN
Start: 2022-06-01 — End: 2022-06-01
  Administered 2022-06-01: 1000 mg via INTRAMUSCULAR

## 2022-06-01 MED ORDER — LIDOCAINE HCL (PF) 1 % IJ SOLN
0.1000 mL | Freq: Once | INTRAMUSCULAR | Status: DC | PRN
Start: 2022-06-01 — End: 2022-06-01

## 2022-06-01 MED ORDER — FENTANYL CITRATE (PF) 250 MCG/5ML IJ SOLN
INTRAMUSCULAR | Status: DC | PRN
Start: 2022-06-01 — End: 2022-06-01
  Administered 2022-06-01 (×2): 50 ug via INTRAVENOUS

## 2022-06-01 MED ORDER — ONDANSETRON HCL 4 MG/2ML IV SOLN
INTRAMUSCULAR | Status: DC | PRN
Start: 2022-06-01 — End: 2022-06-01
  Administered 2022-06-01: 4 mg via INTRAVENOUS

## 2022-06-01 MED ORDER — DEXAMETHASONE SODIUM PHOSPHATE 4 MG/ML IJ SOLN (CUSTOM)
INTRAMUSCULAR | Status: DC | PRN
Start: 2022-06-01 — End: 2022-06-01
  Administered 2022-06-01: 6 mg via INTRAVENOUS

## 2022-06-01 MED ORDER — ONDANSETRON HCL 4 MG/2ML IV SOLN
4.0000 mg | Freq: Once | INTRAMUSCULAR | Status: DC | PRN
Start: 2022-06-01 — End: 2022-06-01

## 2022-06-01 MED ORDER — EPHEDRINE SULFATE (PRESSORS) 5 MG/ML IV SOLN
INTRAVENOUS | Status: DC | PRN
Start: 2022-06-01 — End: 2022-06-01
  Administered 2022-06-01: 7.5 mg via INTRAVENOUS
  Administered 2022-06-01 (×2): 5 mg via INTRAVENOUS
  Administered 2022-06-01: 10 mg via INTRAVENOUS
  Administered 2022-06-01: 7.5 mg via INTRAVENOUS

## 2022-06-01 MED ORDER — DIPHENHYDRAMINE HCL 50 MG/ML IJ SOLN
12.5000 mg | Freq: Once | INTRAMUSCULAR | Status: DC | PRN
Start: 2022-06-01 — End: 2022-06-01

## 2022-06-01 MED ORDER — LIDOCAINE HCL 2 % IJ SOLN WRAPPED RECORD
INTRAMUSCULAR | Status: DC | PRN
Start: 2022-06-01 — End: 2022-06-01
  Administered 2022-06-01: 60 mg via INTRAVENOUS

## 2022-06-01 MED ORDER — LACTATED RINGERS IV SOLN
INTRAVENOUS | Status: DC
Start: 2022-06-01 — End: 2022-06-01

## 2022-06-01 MED ORDER — PROPOFOL 200 MG/20ML IV EMUL
INTRAVENOUS | Status: DC | PRN
Start: 2022-06-01 — End: 2022-06-01
  Administered 2022-06-01: 200 mg via INTRAVENOUS

## 2022-06-01 MED ORDER — SENNA 8.6 MG OR TABS
8.60 mg | ORAL_TABLET | Freq: Every day | ORAL | 0 refills | Status: AC
Start: 2022-06-01 — End: 2022-07-01

## 2022-06-01 MED ORDER — FENTANYL CITRATE (PF) 100 MCG/2ML IJ SOLN
INTRAMUSCULAR | Status: AC
Start: 2022-06-01 — End: 2022-06-01
  Filled 2022-06-01: qty 2

## 2022-06-01 MED ORDER — OXYCODONE HCL 5 MG OR TABS
5.0000 mg | ORAL_TABLET | Freq: Once | ORAL | Status: DC | PRN
Start: 2022-06-01 — End: 2022-06-01

## 2022-06-01 MED ORDER — CEFTRIAXONE SODIUM 1 GM IJ SOLR
INTRAMUSCULAR | Status: DC | PRN
Start: 2022-06-01 — End: 2022-06-01
  Administered 2022-06-01: 1000 mg via INTRAVENOUS

## 2022-06-01 MED ORDER — AMPICILLIN SODIUM 1 GM IJ SOLR
INTRAMUSCULAR | Status: AC
Start: 2022-06-01 — End: 2022-06-01
  Filled 2022-06-01: qty 1000

## 2022-06-01 MED ORDER — LIDOCAINE HCL 2% EX GEL (UROJET)
Status: AC
Start: 2022-06-01 — End: 2022-06-01
  Filled 2022-06-01: qty 10

## 2022-06-01 MED ORDER — FENTANYL CITRATE (PF) 50 MCG/ML IJ SOLN (WRAPPED RECORD) ~~LOC~~
50.0000 ug | INTRAMUSCULAR | Status: DC | PRN
Start: 2022-06-01 — End: 2022-06-01

## 2022-06-01 MED ORDER — HYDROMORPHONE HCL 1 MG/ML IJ SOLN
0.5000 mg | INTRAMUSCULAR | Status: DC | PRN
Start: 2022-06-01 — End: 2022-06-01

## 2022-06-01 MED ORDER — LORAZEPAM 1 MG OR TABS
1.0000 mg | ORAL_TABLET | ORAL | 0 refills | Status: AC
Start: 2022-06-01 — End: 2022-06-01

## 2022-06-01 MED ORDER — CEFAZOLIN SODIUM-DEXTROSE 2-4/5 GM/100ML-% IV SOLN WRAPPER
2000.00 mg | Freq: Once | INTRAVENOUS | Status: DC
Start: 2022-06-01 — End: 2022-06-01

## 2022-06-01 MED ORDER — CELECOXIB 200 MG OR CAPS
200.00 mg | ORAL_CAPSULE | Freq: Two times a day (BID) | ORAL | 0 refills | Status: DC
Start: 2022-06-01 — End: 2022-08-18

## 2022-06-01 MED ORDER — NALOXONE HCL 0.4 MG/ML IJ SOLN
0.1000 mg | INTRAMUSCULAR | Status: DC | PRN
Start: 2022-06-01 — End: 2022-06-01

## 2022-06-01 MED ORDER — OXYCODONE HCL 5 MG OR TABS
5.00 mg | ORAL_TABLET | ORAL | 0 refills | Status: DC | PRN
Start: 2022-06-01 — End: 2022-08-02

## 2022-06-01 MED ORDER — FENTANYL CITRATE (PF) 50 MCG/ML IJ SOLN (WRAPPED RECORD) ~~LOC~~
25.0000 ug | INTRAMUSCULAR | Status: DC | PRN
Start: 2022-06-01 — End: 2022-06-01

## 2022-06-01 MED ORDER — ACETAMINOPHEN 325 MG PO TABS
975.0000 mg | ORAL_TABLET | Freq: Once | ORAL | Status: AC
Start: 2022-06-01 — End: 2022-06-01
  Administered 2022-06-01: 975 mg via ORAL
  Filled 2022-06-01: qty 3

## 2022-06-01 SURGICAL SUPPLY — 38 items
BAG CYSTO DRAIN SKYTRON TABLE (Misc Surgical Supply)
BAG URINE DRAIN 200Ml (Lines/Drains)
BASIN SINGLE BASIC STERILE (Misc Surgical Supply) ×2
BASKET DAKOTA 1.9F X 11MM X 120CM (Procedural wires/sheaths/catheters/balloons/dilators) ×2 IMPLANT
CATHETER URETERAL OPEN END 5FR X 70CM (Procedural wires/sheaths/catheters/balloons/dilators) ×2 IMPLANT
CATHETER URETHRAL DUAL LUMEN 10 FR X 54 CM (Lines/Drains) ×2
CONTAINER PRECISION SPECIMEN, 4OZ- STERILE (Misc Medical Supply) ×2
DRAIN BAG URINE 2000 ML (Lines/Drains) IMPLANT
DRAPE X-RAY C ARM 41" X 74" (Drapes/towels) IMPLANT
DRESSING TELFA STRL 3" X 8" (Dressings/packing) ×2 IMPLANT
ELECTRODE BUGBEE FULGURATING 2FR X 105CM ACMI (Cautery) IMPLANT
ELECTRODE FULURGATING BUGBEE SHORT TIP ACMI (Cautery)
FIBER LASER HOLMIUM 200 (Misc Medical Supply) ×2 IMPLANT
FORCEP ALLIGATOR 3.3FR X 115CM FLEXIBLE SHAFT (Procedural wires/sheaths/catheters/balloons/dilators) ×2
FORCEP BIOPSY BIGOPSY BACKLOADING 2.4FR X 115CM (Procedural wires/sheaths/catheters/balloons/dilators) IMPLANT
FORCEPS BIOPSY URETEROSCOPIC PIRANHA 3F 1.1MM X 115 CM FLEXIBLE (Procedural wires/sheaths/catheters/balloons/dilators) IMPLANT
GLOVE BIOGEL PI INDICATOR SIZE 6 (Gloves/Gowns) ×4 IMPLANT
GLOVE BIOGEL PI ULTRATOUCH SIZE 7 (Gloves/Gowns) ×2 IMPLANT
GLOVE SURGEON BIOGEL SIZE 7.5 (Gloves/Gowns) ×2
GOWN SIRUS XLG BLUE, AAMI LVL 4 (Gloves/Gowns) ×2 IMPLANT
GOWN SURGICAL ULTRA XL BLUE, AAMI LVL 3 (Gloves/Gowns) ×2 IMPLANT
GUIDEWIRE AMPLATZ SUPER STIFF 0.035" X 145CM, 7CM BENTSON (Procedural wires/sheaths/catheters/balloons/dilators) ×2
GUIDEWIRE SENSOR DUAL FLEX ANGLED TIP .035 X 150CM NITINOL (Procedural wires/sheaths/catheters/balloons/dilators) ×2
GUIDEWIRE SENSOR DUAL FLEX STRAIGHT TIP .035 X 150CM NITINOL (Procedural wires/sheaths/catheters/balloons/dilators) ×2 IMPLANT
HOLDER FOLEY CATH SECUREMENT DEVICE STATLOCK (Patient Care Supply)
NEEDLE PROTECT 22G X 1.5" (Needles/punch/cannula/biopsy) IMPLANT
RENTAL FEE FORTEC LASER HOLMIUM LOW WATTAGE (Rentals) ×2 IMPLANT
SCRUB, SURGICAL, EXIDINE, 4%, CHG, 4OZ (Prep Solutions)
SHEATH URETERAL NAVIGATOR 11F/13F X 46CM (Procedural wires/sheaths/catheters/balloons/dilators) ×2 IMPLANT
SLEEVE SCD KNEE MEDIUM (Patient Care Supply) IMPLANT
SOLUTION IRR BAG .9% N/S 3000ML (Non-Pharmacy Meds/Solutions)
SOLUTION IRR POUR BTL 0.9% NS 1000ML (Non-Pharmacy Meds/Solutions)
SOLUTION IRR POUR BTL H20 1000ML (Non-Pharmacy Meds/Solutions) IMPLANT
STENT ASCERTA FIRM 6FR X 26CM (Ureteral Stents) ×2 IMPLANT
SURGICAL PACK CYSTO - SAME DAY (Procedure Packs/kits) ×2
TUBING CYSTO BLADDER IRRIGATION SET (Tubing/Suction) ×2 IMPLANT
TUBING SUCTION MEDI-VAC 9/32" X 20' (Tubing/Suction) ×2
VALVE ENDOSCOPIC ADJUSTABLE UROSEAL (Misc Surgical Supply) ×2 IMPLANT

## 2022-06-01 NOTE — Op Note (Signed)
OPERATIVE NOTE    CASE ID: [id number]    DATE OF OPERATION: 06/01/2022    PREOPERATIVE DIAGNOSIS: Upper tract urothehlial carcinoma    POSTOPERATIVE DIAGNOSIS: Same    PROCEDURE:   Procedure(s):  Left ureteroscopy, biopsy,  laser fulguration of lesion,cytology washings,  Left insertion of ureteral stent    Surgeon(s) and Role:     * Carlota Raspberry, MD - Primary     * Herbert Spires, Theressa Stamps, MD - Resident - Assisting    ANESTHESIA: General    Anesthesiologist: Tivis Ringer, MD  CRNA: Starla Link Pervis Hocking, CRNA     OR Staff:  Circulator: Laurin Coder, RN; Valeria Batman, Florencia  Scrub: Damaris Schooner; Clarene Critchley; Eldridge, [place]  X-Ray Tech: McKinney, Ilda Foil, RN    FINDINGS:   -Small caliber stricture at the level of the ureteropelvic junction on retrograde pyelogram, dilated sequentially with a dual lumen catheter and then a 11/13Fr x 46cm ureteral access sheath.  Ureteral washings were taken.  -Lower pole calyx with erythema and papillary lesions, fulgurated with the holmium laser at 0.4J and '20Hz' .  -After dilation of the left ureter, the left collecting system was noted to be draining.  -A 34f x 26cm ureteral stent was placed  -We then biopsied the posterior bladder, left lateral bladder wall,     INDICATIONS FOR PROCEDURE: The patient is a 71year old male with a history of solitary kidney, recurrent bladder neck contracture s/p TURP, and left ureteral filling defect on evaluation for hematuria.  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         PROCEDURE IN DETAIL:  The patient was met and examined in the preoperative area. The indications, risks, benefits, and alternatives were explained to the patient.  All questions were answered and informed consent was obtained. The patient was then brought to the operating room and positioned supine on the surgical bed. Anesthesia was induced. The patient was placed  in a dorsal lithotomy position, prepped and draped in the usual sterile fashion. Antibiotics consisting of ceftriaxone and ampicillin IV were administered. A final time out was performed, confirming the correct patient, patient information, procedure, imaging, and equipment.     We began by intubating the urethra with a 19.5Fr rigid cystoscope.  There was noted papillary lesions an erythema in a large right diverticulum and along the posterior bladder wall.  We proceeded to intubate the left ureteral orifice with a 5Fr open ended catheter and shot a gentle retrograde.  We noted minimal contrast in the left collecting system and a stricture at the left ureteropelvic junction.  We advanced a sensor wire and confirmed its presence in the left renal pelvis with fluoroscopy. We decided to sequentially dilate this stricture beginning with a dual lumen catheter and then proceeding to an 11/13Fr x 46 cm ureteral access sheath.  We began by advancing the inner obturator first and then advanced the access sheath with the inner and outer access sheath in place.  We noted efflux of urine and collected a specimen for cytology.    We advanced a flexible ureteroscope into the left renal pelvis and performed a full survey of the calyces.  We noted erythema and papillary lesions in the lower pole calyx at the site of the left nephrostomy tube.  We proceeded to fulgurate these areas with Holmium laser.  Once completed the decision was made to place a 6Fr x 26cm stent.  This was  deployed successfully and confirmed on fluoroscopy.    We then once again placed a 19.5Fr rigid cystoscope and performed bladder biopsies of the left bladder wall and posterior bladder wall.  We used bug bee for fulguration and once we attained appropriate hemostasis we emptied the bladder.  All counts were correct at the conclusion of the case.    The attending surgeon was present and scrubbed for the entirety of the case.    Procedure(s):  Left ureteroscopy,  biopsy,  laser fulguration of lesion,cytology washings,  Left insertion of ureteral stent - Wound Class: Class II (Clean Contaminated) - Incision Closure: No Incision / NA    SPECIMENS:  ID Type Source Tests Collected by Time Destination   1 : Left renal pelvic washings Washings Pelvic CYTOPATH NON GYN Carlota Raspberry, MD 06/01/2022 1241    A : Left lateral bladder wall Tissue Bladder PATHOLOGY TISSUE Veronia Beets, MD 06/01/2022 1339    B : posterior bladder wall Tissue Bladder PATHOLOGY TISSUE Veronia Beets, MD 06/01/2022 1339        IMPLANTS:  Implant Name Type Inv. Item Serial No. Manufacturer Lot No. LRB No. Used Action   STENT ASCERTA FIRM 6FR X 56PV - [id number] Ureteral Stents STENT ASCERTA FIRM 6FR X 94IA  [employer] [id number] Left 1 Implanted       Fluids/Blood Products:      IV Fluids: 500 ml crystalloid    Blood Products: none    EBL: 10 ml    Urine Output: unknown    COMPLICATIONS: None    DISPOSITION:  Patient extubated uneventfully and brought to PACU in stable condition.  PACU for recovery, will have follow-up with Dr. Sallyanne Kuster to discuss pathology.

## 2022-06-01 NOTE — Discharge Instructions (Addendum)
Diagnosis and Reason for Admission    You were admitted to the hospital for the following reason(s):  Upper tract urothelial cancer    Your full diagnosis list is located on this After Visit Summary in the Hospital Problems section.    What [facility] Hospital Stay    The main tests and treatments done for you during this hospitalization were:    Left ureteroscopy and laser fulguration of upper tract malignancy, left ureteral stent placement, left retrograde pyelogram, and bladder biopsies.    The following evaluation is still important to complete after discharge from the hospital:  Follow-up with Dr. Sallyanne Kuster on 06/14/2022    Instructions for After Discharge    Your diet at home should be a regular diet.    Your activity level at home should be:  As tolerated    Specific activity restrictions:    Do not drive while taking narcotic pain medications.    Wound or tube care instructions:  Expect some blood in your urine. If it stays dark (like color of red wine) or if clots form that prevent you from urinating, please call or go to ER.     Your medication list is located on this After Visit Summary in the Current Discharge Medication List section.  Your nurse will review this information with you before you leave the hospital.    Reasons to Contact a Doctor Urgently    Call or go to ED if:  Increased or uncontrolled pain.  Severe abdominal pain.  Nausea and vomiting.  Discharge or leakage from the surgical (or wound) site.  Fevers or chills.  Shortness of breath or difficulty breathing.  Chest pains or palpitations.  Expect some blood in your urine. If it stays dark (like color of red wine) or if clots form that prevent you from urinating, please call or go to ER.   You will have discomfort with urinating because of the stent. This can be the sensation of having to urinate frequently and flank pain with voiding.     If you have any questions about your hospital care, your medications, or if you have new or  concerning symptoms soon after going home from the hospital, and you need to contact your hospital physician, your hospital physician can be contacted in the following manner:  [facility] operator at [phone].     Once you are able to see your primary care physician (PCP), your PCP will then be responsible for further medication refills, or appointment referrals.    What Needs to Happen Next After Discharge -- Appointments and Follow Up    Any appointments already scheduled at [facility] clinics will be listed in the Future Appointments section at the top of this After Visit Summary.  Any appointments that have been requested, but have not yet been scheduled, will be listed below that under Orders After Discharge.    Sometimes tests performed in the hospital do not yet have results by the time a patient goes home.  The following key tests will need to be followed up at your next appointment: None    Additional Information for You from your Case Manager and/or Social Worker       Additional Information for You from your Discharging Nurse      Post-operative medications    Several non-opioid medications will be prescribed  Take medications as prescribed to stay ahead of the unpredictable pain following kidney stone surgery  It is ok to omit an  individual medication if allergic or not covered by insurance.   The pain may worsen during the 48 hours after stent removal. Please continue medications for 2 days after stent removal.

## 2022-06-01 NOTE — H&P (Signed)
Pre-Operative Surgery History and Physical      Patient Name: Stephen [name]  MRN: [id number]  Room#: [facility] OR/Main OR        ID: Stephen [name] is a 71 year old presenting for L URS, possible biopsy, possible laser fulguration, possible cytology washings, possible ureteral stent insertion.  Pt w/Hx of solitary kidney, recurrent bladder neck contracture s/p TURP, and left ureteral filling defect on evaluation for hematuria.  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       DM: (-)  BB: (-)  UCx 04/07/22: (-) E. Faecalis      Patient Active Problem List    Diagnosis Date Noted   . Myalgia, other site 03/07/2022     Added automatically from request for surgery [id number]     . Trigger point with back pain 03/07/2022     Added automatically from request for surgery [id number]     . Urothelial carcinoma of kidney, left (CMS-HCC) 02/22/2022   . Urinary tract infection associated with indwelling urethral catheter (CMS-HCC) 01/11/2022   . Ureteral obstruction 01/11/2022   . Hydronephrosis 01/11/2022   . COVID-19 virus infection 12/10/2021   . Community acquired pneumonia 12/10/2021   . Acute urinary retention 12/10/2021   . HTN (hypertension) 12/10/2021   . Gout 12/10/2021   . MDD (major depressive disorder) 12/10/2021   . Urothelial cancer (CMS-HCC) 12/10/2021   . Cauda equina compression (CMS-HCC) 10/29/2021   . Ureter malignant neoplasm, left (CMS-HCC) 10/26/2021     Added automatically from request for surgery [id number]     . Dorsopathy 06/28/2021     Added automatically from request for surgery [id number]     . Malignant neoplasm of urinary bladder, unspecified site (CMS-HCC) 06/25/2021     Added automatically from request for surgery [id number]     . Left renal mass 04/29/2021     Added automatically from request for surgery [id number]     . Spondylolisthesis at L5-S1 level 08/18/2020   . Chronic midline thoracic back pain 05/06/2020     Added  automatically from request for surgery [id number]     . Chronic left SI joint pain 02/19/2020     Added automatically from request for surgery [id number]     . S/P lumbar fusion 09/06/2019   . Cervical spondylosis 06/11/2019     Added automatically from request for surgery [id number]     . Pre-procedure lab exam 04/22/2019     Added automatically from request for surgery [id number]     . Cervical spondylosis without myelopathy 04/22/2019     Added automatically from request for surgery [id number]     . Degenerative spondylolisthesis 03/24/2019     Added automatically from request for surgery [id number]     . Lumbar stenosis with neurogenic claudication 03/24/2019     Added automatically from request for surgery [id number]     . Chronic SI joint pain 12/20/2018     Added automatically from request for surgery [phone]     . Lumbosacral spondylosis without myelopathy 05/24/2018     Added automatically from request for surgery [id number]     . Incomplete bladder emptying 02/01/2018     Added automatically from request for surgery [phone]     . Cervical radiculopathy 01/15/2018     Added automatically from request for surgery [id number]     . Lumbar radiculopathy 10/18/2017     Added automatically from request for surgery  [id number]     . Bladder neck contracture 10/11/2017     Added automatically from request for surgery [id number]     . Post-traumatic stricture of anterior urethra 05/23/2017     Added automatically from request for surgery [id number]         Past Medical History:   Diagnosis Date   . Chronic back pain    . Congenital hydronephrosis    . Gout    . Headache    . Hematuria    . HTN (hypertension) 12/10/2021   . Kidney disease    . Kidney stones    . Major depressive disorder, single episode    . Polyarthropathy or polyarthritis of multiple sites    . Retinal detachment    . Urethral stricture        Past Surgical History:   Procedure Laterality Date   . CT INSERTION OF SUPRAPUBIC CATH  09/25/2015   . NEPHRECTOMY Right 1995   . APPENDECTOMY     .  COLONOSCOPY     . CYSTOSCOPY     . CYSTOSCOPY W/ LASER LITHOTRIPSY     . OTHER SURGICAL HISTORY      Interstim 01/29/2011   . SPINE SURGERY  09/21    Lumbar-sacral fusion   . TRANSURETHRAL RESECTION OF PROSTATE           Current Facility-Administered Medications   Medication Dose Route Frequency Provider Last Rate Last Admin   . ceFAZolin in dextrose (ANCEF) IVPB 2,000 mg  2,000 mg IntraVENOUS Once Bagrodia, Aditya, MD       . lactated ringers infusion   IntraVENOUS Continuous Bagrodia, Aditya, MD 30 mL/hr at 06/01/22 0942 New Bag at 06/01/22 0942   . lactated ringers infusion   IntraVENOUS Continuous Bagrodia, Aditya, MD       . lidocaine (XYLOCAINE) 1% PF injection 0.1 mL  0.1 mL IntraDERMAL Once PRN Bagrodia, Aditya, MD             Allergies   Allergen Reactions   . Sulfa Drugs Unspecified         Family History   Adopted: Yes   Family history unknown: Yes       Social History     Socioeconomic History   . Marital status: Single     Spouse name: Not on file   . Number of children: Not on file   . Years of education: Not on file   . Highest education level: Not on file   Occupational History   . Not on file   Tobacco Use   . Smoking status: Never   . Smokeless tobacco: Never   Vaping Use   . Vaping Use: Never used   Substance and Sexual Activity   . Alcohol use: No   . Drug use: Not Currently   . Sexual activity: Not Currently     Partners: Female   Other Topics Concern   . Military Service No   . Blood Transfusions Yes   . Caffeine Concern No   . Occupational Exposure No   . Hobby Hazards No   . Sleep Concern Yes     Comment: due to my lower back discomfort and leg discomfort   . Stress Concern Yes     Comment: Health/ housing/ financial   . Weight Concern No   . Special Diet No   . Back Care Yes     Comment: Am very careful with any activity   . Exercises   Regularly Yes   . Seat Belt Use Yes   . Performs Self-Exams Yes   . Bike Helmet Use No   Social History Narrative   . Not on file     Social Determinants of  Health     Financial Resource Strain: Not on file   Food Insecurity: Not on file   Transportation Needs: Not on file   Physical Activity: Not on file   Stress: Not on file   Social Connections: Not on file   Intimate Partner Violence: Not on file   Housing Stability: Not on file       Review Of Systems  As noted in HPI/PMH      Physical Exam:   Vitals: BP 124/65 (BP Location: Right arm, BP Patient Position: Sitting)   Pulse 57   Temp 96.6 F (35.9 C)   Resp 16   Ht 5' 6.93" (1.7 m)   Wt 86.3 kg (190 lb 3.2 oz)   SpO2 96%   BMI 29.85 kg/m       General Description: Alert and oriented. Affect normal.     Chest and Lungs: No deformity.     Heart: Regular rate and rhythm.     Abdomen: Soft, non-distended, non-tender.    Integument: No active lesion.    Neurologic: No focal deficit    Musculoskeletal: Moves all extremities without apparent deficit    Peripheral Vascular: extremities warm, well perfused    Recent Labs:  Lab Results   Component Value Date    NA 141 04/07/2022    K 4.5 04/07/2022    CL 104 04/07/2022    BICARB 25 04/07/2022    BUN 23 04/07/2022    CREAT 1.21 (H) 04/07/2022    GLU 112 (H) 04/07/2022    [place] 9.2 04/07/2022    AST 16 01/19/2022    ALT 25 01/19/2022    ALK 129 01/19/2022    TBILI 0.17 01/19/2022    ALB 4.5 04/07/2022    TP 5.4 (L) 01/19/2022       Lab Results   Component Value Date    WBC 5.4 04/07/2022    RBC 4.66 04/07/2022    HGB 13.3 (L) 04/07/2022    HCT 40.7 04/07/2022    MCV 87.3 04/07/2022    MCHC 32.7 04/07/2022    RDW 15.9 (H) 04/07/2022    PLT 221 04/07/2022    MPV 10.4 04/07/2022    LYMPHS 36 04/07/2022    MONOS 10 04/07/2022    EOS 2 04/07/2022    BASOS 1 04/07/2022       Lab Results   Component Value Date    PT 12.5 01/17/2022    INR 1.1 01/17/2022    PTT 31 01/11/2022       Imaging:    IR Nephrostogram    Result Date: 05/06/2022  Narrative: EXAM DESCRIPTION: IR NEPHROSTOGRAM CLINICAL HISTORY: Solitary left kidney with urothelial cancer and hydronephrosis s/p nephrostomy  tube, here for routine exchange. PROCEDURE: 1. Antegrade pyelogram 2. Fluoroscopic percutaneous nephrostomy exchange 3. Moderate sedation was provided by an IR nurse under constant supervision of the Interventional Radiologist. Sedation time was 45 minutes. CONSENT: The risks benefits and alternatives to the procedure were discussed with the patient and/or patient's representative. The patient or patient's representative verbalized understanding and the desire to proceed. Written informed consent was obtained. Sterile precautions were used. TECHNIQUE: The patient was placed in a prone position. Initially, a diagnostic nephrostogram was performed through the indwelling catheter.  Following the diagnostic study, the 8.5 Fr PCN was removed over a wire and replaced with 8.5 French PCN. Post placement injection was performed. The tube was sutured in place and connected to gravity drainage Complications: None OPERATORS: Staff: Dr. Redmond Fellow/Resident/PA/NP: Liu CONTRAST: 10 ml visipaque 320 into the collecting system MEDICATIONS: Local Lidocaine FLUOROSCOPY TIME: RF/XA Dose Summary: Plane A - DAP: 24 uGym2; Beam On Time: NaN min; Fluoro Time: .5 min; Reference Point Dose: 1.5 mGy; Fluoro DAP: NaN uGym2 FINDINGS: Pre-existing PCN in appropriate position. Post exchange injection shows good position and drainage. CONCURRENT SUPERVISION: I was present for the procedure and have reviewed the images and report.    Preliminary created by: Liu, Michael Signed by: Redmond, Jonas 05/06/2022 10:28:40    Impression: IMPRESSION: Successful nephrostomy exchange on the LEFT side with a new 8.5 Fr tube. Plan: Routine nephrostomy exchange in 8-12 weeks.     Assessment  Preoperative history and physical completed for Consented Surgical Procedure.    PLAN  Proceed to OR for scheduled surgery      Abx: Ampicillin and ceftriaxone    Code Status: Full Code/Full Care

## 2022-06-01 NOTE — Anesthesia Postprocedure Evaluation (Signed)
Anesthesia Post Note    Patient: Stephen [name]    Procedure(s) Performed: Procedure(s):  Left ureteroscopy, biopsy,  laser fulguration of lesion,cytology washings,  Left insertion of ureteral stent      Final anesthesia type: General    Patient location: PACU    Post anesthesia pain: adequate analgesia    Mental status: drowsy    Airway Patent: Yes    Last Vitals:   Vitals Value Taken Time   BP 125/49 06/01/22 1400   Temp 35.9 C 06/01/22 1400   Pulse 55 06/01/22 1405   Resp 15 06/01/22 1405   SpO2 99 % 06/01/22 1400   Vitals shown include unvalidated device data.     Post vital signs: stable    Hydration: adequate    N/V:no    Anesthetic complications: no    Plan of care per primary team.

## 2022-06-03 NOTE — Telephone Encounter (Signed)
RN called and spoke with patient on the phone   Discussed that Ativan 1 mg script was signed by Gwenyth Ober NP    Patient has picked it up   Verbalizes understanding how to take medication   No further action needed   Closing encounter

## 2022-06-04 ENCOUNTER — Inpatient Hospital Stay (INDEPENDENT_AMBULATORY_CARE_PROVIDER_SITE_OTHER): Admit: 2022-06-04 | Discharge: 2022-06-04 | Disposition: A | Discharge status: Home Health | Payer: Medicare Other

## 2022-06-04 DIAGNOSIS — M5030 Other cervical disc degeneration, unspecified cervical region: Secondary | ICD-10-CM

## 2022-06-04 DIAGNOSIS — R9089 Other abnormal findings on diagnostic imaging of central nervous system: Secondary | ICD-10-CM

## 2022-06-04 DIAGNOSIS — M4802 Spinal stenosis, cervical region: Secondary | ICD-10-CM

## 2022-06-04 MED ORDER — GADOBUTROL 1 MMOL/ML IV SOLN (WRAPPED RECORD)
8.0000 mL | Freq: Once | INTRAVENOUS | Status: AC
Start: 2022-06-04 — End: 2022-06-04
  Administered 2022-06-04: 8 mL via INTRAVENOUS

## 2022-06-04 MED ORDER — GADOBUTROL 1 MMOL/ML IV SOLN (WRAPPED RECORD)
10.0000 mL | Freq: Once | INTRAVENOUS | Status: DC
Start: 2022-06-04 — End: 2022-06-04

## 2022-06-07 ENCOUNTER — Telehealth (HOSPITAL_BASED_OUTPATIENT_CLINIC_OR_DEPARTMENT_OTHER): Payer: Self-pay

## 2022-06-07 NOTE — Telephone Encounter (Signed)
[facility] / Medical Oncology Social Work Telephone note:    [name] left me a voicemail last week asking for f/u SW support.  He is managed by Dr. Sallyanne Kuster.  [name] underwent testing re: his bladder cancer on 6/14.  He will be getting results on 6/27 and wants to talk to me after he gets the results.  (Has appt with Dr. Sallyanne Kuster for 9:45 am that day.)    I was able to make phone contact today.  We spoke for 10 minutes.  He was open and talkative with this LCSW.        [name] mentioned that he has sent me several texts that I didn't answer.  Reiterated that my office is a landline only and does not take texts.  (I have already reviewed this with him, in person X 2.)    Informed pt that d/t my schedule, I won't be able to meet in person on his next appt date.  We agreed touch base on 6/27, likely by phone.    [name], [place] Social Worker  [phone]

## 2022-06-09 ENCOUNTER — Other Ambulatory Visit (INDEPENDENT_AMBULATORY_CARE_PROVIDER_SITE_OTHER): Payer: Medicare Other

## 2022-06-14 ENCOUNTER — Other Ambulatory Visit (HOSPITAL_BASED_OUTPATIENT_CLINIC_OR_DEPARTMENT_OTHER): Payer: Self-pay | Admitting: Urology

## 2022-06-14 ENCOUNTER — Encounter (HOSPITAL_BASED_OUTPATIENT_CLINIC_OR_DEPARTMENT_OTHER): Payer: Self-pay | Admitting: Urology

## 2022-06-14 ENCOUNTER — Encounter (HOSPITAL_BASED_OUTPATIENT_CLINIC_OR_DEPARTMENT_OTHER): Payer: Self-pay

## 2022-06-14 ENCOUNTER — Ambulatory Visit: Payer: Medicare Other | Attending: Urology | Admitting: Urology

## 2022-06-14 VITALS — BP 109/62 | HR 59 | Temp 97.6°F | Resp 16 | Ht 66.93 in | Wt 191.1 lb

## 2022-06-14 DIAGNOSIS — C678 Malignant neoplasm of overlapping sites of bladder: Secondary | ICD-10-CM | POA: Insufficient documentation

## 2022-06-14 DIAGNOSIS — C689 Malignant neoplasm of urinary organ, unspecified: Secondary | ICD-10-CM | POA: Insufficient documentation

## 2022-06-14 NOTE — Progress Notes (Signed)
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  CT urography on 04/22/2021 confirmed a filling defect in the anterior left renal pelvis.  Chest CT has been ordered but not performed yet.  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  He tolerated the procedure well.      He received 6 instillations of gelatinous mitomycin between August 05, 2021 and September 09, 2021.    MR urogram from October 26, 2021 with mild thickening in the left renal pelvis otherwise unremarkable.    Baseline creatinine has remained pre stable at 1.32    Notably, [facility] bladder cancer on 08-19-2021; repeat on 08-26-2021 no cancer.    He received ureteroscopy and transurethral resection of bladder tumor on December 24, 2021 which was notable for small volume low-grade appearing tumors in the renal pelvis as well as a tumor in the mid ureter.  Final pathology from the bladder was notable for high-grade urothelial dysplasia.    He was readmitted for urinary tract infection and hydronephrosis despite a stent the nephrostomy tube was placed on January 11, 2022.  Stent removal has been performed and nephrostogram on 02/11/2022 shows minimal drainage of contrast into the bladder.  Repeat anterograde nephrostogram following a course of steroids was notable for partial anterograde egress of contrast into the bladder    Ureteroscopy and transurethral resection of bladder tumor on 06-01-2022 with no evidence of disease on pathology.  Wide caliber UPJ stricture; stent was placed    Review of Systems: No TIA's or unusual headaches, no dysphagia.  No prolonged cough. No  dyspnea or chest pain on exertion.  No abdominal pain, change in bowel habits, black or bloody stools.  No urinary tract or BPH symptoms.  No new or unusual musculoskeletal symptoms.    Physical Exam:  There were no vitals filed for this visit.   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    -stent removal; BCG through nephrostomy (decent dwell with UPJ obstruction)  -antegrade nephrostogram after BCG instillation; if draining remove; if not, replace into the upper pole    -MRU urogram and chest CT

## 2022-06-14 NOTE — Patient Instructions (Signed)
PLAN:    Schedule a visit in 2 weeks for a nurse visit (BCG)   Schedule a visit in 12 weeks with Dr. Sallyanne Kuster for cystoscopy   PLEASE [facility]   Call to schedule imaging and complete at least 1.5 weeks prior to next visit   -Please call [facility] Scheduling at [phone] to schedule your appointment at your earliest convenience. In order to obtain the most accurate insurance approval, we secure authorization after you are registered and scheduled for imaging services.  Please have your insurance cards available when scheduling your exam.       For more information on Urology, please visit:   [email or web address]    [email or web address]      Carlota Raspberry MD  Department of Urology     [facility] - Urology  Monday-Friday 8:00- 5:00p.m. (Closed Holidays and Weekends)     For any question or to schedule any appointment call [phone]    AFTER HOURS EMERGENCY NUMBER [phone]  Ask for the Urologist/Oncologist Physician On-Call.        [facility] - Urology  Fax number: [phone]     Clinical Nurse  Vicente Males  Phone: [phone]  Fax: [phone]    Administrative Assistant/Surgery [place]   Phone: [phone]  Fax: [phone]    Social Worker  Agnes Lawrence, LCSW  Phone: [phone]

## 2022-06-15 ENCOUNTER — Telehealth (HOSPITAL_BASED_OUTPATIENT_CLINIC_OR_DEPARTMENT_OTHER): Payer: Self-pay | Admitting: Urology

## 2022-06-15 ENCOUNTER — Telehealth (HOSPITAL_BASED_OUTPATIENT_CLINIC_OR_DEPARTMENT_OTHER): Payer: Self-pay

## 2022-06-15 DIAGNOSIS — C678 Malignant neoplasm of overlapping sites of bladder: Secondary | ICD-10-CM

## 2022-06-15 NOTE — Telephone Encounter (Signed)
[name] left me a message yesterday to check in.  He met with Dr. Sallyanne Kuster re: testing results.  [name] also asked for the phone number to [facility].    I sent him a one way text with phone number to cancer care.    Today, I called and left a message for [name].  He is welcome to call back anytime.    [name], [place] Social Worker  [phone]

## 2022-06-15 NOTE — Telephone Encounter (Signed)
Caller: Pt   Relationship to patient: Self   Phone # [phone]  Provider: Sallyanne Kuster   Notes:     Pt is calling to speak with Paulina in regards to scheduling additional BCG treatments. Pt would like a call back.   Please assist.     Caller has been advised of 48-72 hr turnaround time.

## 2022-06-20 ENCOUNTER — Ambulatory Visit (HOSPITAL_BASED_OUTPATIENT_CLINIC_OR_DEPARTMENT_OTHER): Payer: Medicare Other

## 2022-06-22 NOTE — Telephone Encounter (Signed)
Instructed to speak with urology regarding PCN change around BCG treatment.  Our next scheduled preventative maintenance change will be mid august.    S Tymeir Weathington PA

## 2022-06-22 NOTE — Telephone Encounter (Signed)
Pt will be having multiple BCG treatments using his neph tube soon.  Would like to know if his neph tube should be changed prior to treatment beginning.        Please contact pt. By phone or MyChart message

## 2022-06-23 ENCOUNTER — Other Ambulatory Visit (HOSPITAL_BASED_OUTPATIENT_CLINIC_OR_DEPARTMENT_OTHER): Payer: Self-pay | Admitting: Urology

## 2022-06-23 DIAGNOSIS — R339 Retention of urine, unspecified: Secondary | ICD-10-CM

## 2022-06-24 ENCOUNTER — Encounter (HOSPITAL_BASED_OUTPATIENT_CLINIC_OR_DEPARTMENT_OTHER): Payer: Self-pay | Admitting: Urology

## 2022-06-24 MED ORDER — TAMSULOSIN HCL 0.4 MG PO CAPS
ORAL_CAPSULE | ORAL | 1 refills | Status: DC
Start: 2022-06-24 — End: 2022-12-09

## 2022-06-26 ENCOUNTER — Other Ambulatory Visit (HOSPITAL_BASED_OUTPATIENT_CLINIC_OR_DEPARTMENT_OTHER): Payer: Self-pay | Admitting: Anesthesiology

## 2022-06-26 ENCOUNTER — Other Ambulatory Visit (HOSPITAL_COMMUNITY): Payer: Self-pay | Admitting: Student in an Organized Health Care Education/Training Program

## 2022-06-26 ENCOUNTER — Other Ambulatory Visit (HOSPITAL_BASED_OUTPATIENT_CLINIC_OR_DEPARTMENT_OTHER): Payer: Self-pay | Admitting: Nurse Practitioner

## 2022-06-26 DIAGNOSIS — Z981 Arthrodesis status: Secondary | ICD-10-CM

## 2022-06-26 DIAGNOSIS — M47817 Spondylosis without myelopathy or radiculopathy, lumbosacral region: Secondary | ICD-10-CM

## 2022-06-26 DIAGNOSIS — C679 Malignant neoplasm of bladder, unspecified: Secondary | ICD-10-CM

## 2022-06-27 ENCOUNTER — Telehealth (HOSPITAL_BASED_OUTPATIENT_CLINIC_OR_DEPARTMENT_OTHER): Payer: Self-pay | Admitting: Urology

## 2022-06-27 MED ORDER — PREGABALIN 100 MG OR CAPS
100.0000 mg | ORAL_CAPSULE | Freq: Every evening | ORAL | 2 refills | Status: DC
Start: 2022-06-27 — End: 2022-07-27

## 2022-06-27 NOTE — Telephone Encounter (Signed)
Routed to provider to review triage below and refill request     DOS: N/A  LOV: 05/26/22  NOV: None scheduled  LRD: 05/20/22

## 2022-06-27 NOTE — Telephone Encounter (Addendum)
PT Is calling because they have questions regarding  Scheduling an upcoming Bcg treatment.    Please call:[phone]

## 2022-06-27 NOTE — Telephone Encounter (Signed)
signed

## 2022-06-29 NOTE — Telephone Encounter (Signed)
Pt is calling to follow up. Agent informed Pt that there are no orders for BCG. Agent called and spoke with [place]. Mariane Baumgarten will be checking to confirm that there are enough vials. Paulina will call Pt back.

## 2022-06-29 NOTE — Telephone Encounter (Signed)
Pt requesting status on message below.   Pt is BCG maint x3.    Please confirm if ok to schedule .

## 2022-06-30 ENCOUNTER — Encounter (HOSPITAL_BASED_OUTPATIENT_CLINIC_OR_DEPARTMENT_OTHER): Payer: Self-pay | Admitting: Nurse Practitioner

## 2022-06-30 DIAGNOSIS — Z981 Arthrodesis status: Secondary | ICD-10-CM

## 2022-07-01 NOTE — Telephone Encounter (Signed)
Returned call to Stephen [name]. States that the pharmacy's system was down and he can refill the medication and let the patient know when it is ready.    Matter resolved.

## 2022-07-01 NOTE — Telephone Encounter (Signed)
From: Lovena Le  To: Sheria Lang, NP  Sent: 06/30/2022 10:35 AM PDT  Subject: Lyrica renewal     trying to get my Lyrica renewed first attempt was sent to a different doctor MR# [id number] thank e

## 2022-07-01 NOTE — Telephone Encounter (Signed)
Tc to Seth Bake at [facility], states the Lyrica did not go through.    Pharmacy    [id number] [place] [place]  [address] [id number]  Phone:  [phone]:  [phone]  DEA #:  [id number]  DAW Reason: --     Outpatient Medication Detail    pregabalin (LYRICA) 100 MG capsule        Sig - Route: Take 1 capsule (100 mg) by mouth at bedtime. - Oral        Sent to pharmacy as: pregabalin (LYRICA) 100 MG capsule        Class: ePrescribe        Notes to Pharmacy: Not to exceed 5 additional fills before 10/16/2022        Date/Time Signed: 06/27/2022 19:02       E-Prescribing Status: Transmission to pharmacy failed (06/27/2022 8:05 PM PDT)          Routed to rn team to re- order if provided would like that.

## 2022-07-05 ENCOUNTER — Ambulatory Visit: Payer: Medicare Other | Admitting: Physician Assistant

## 2022-07-05 ENCOUNTER — Encounter (HOSPITAL_BASED_OUTPATIENT_CLINIC_OR_DEPARTMENT_OTHER): Payer: Self-pay | Admitting: Physician Assistant

## 2022-07-05 DIAGNOSIS — M4310 Spondylolisthesis, site unspecified: Secondary | ICD-10-CM

## 2022-07-05 DIAGNOSIS — M5416 Radiculopathy, lumbar region: Secondary | ICD-10-CM

## 2022-07-05 DIAGNOSIS — C642 Malignant neoplasm of left kidney, except renal pelvis: Secondary | ICD-10-CM

## 2022-07-05 DIAGNOSIS — Z981 Arthrodesis status: Secondary | ICD-10-CM

## 2022-07-05 DIAGNOSIS — M48062 Spinal stenosis, lumbar region with neurogenic claudication: Secondary | ICD-10-CM

## 2022-07-05 DIAGNOSIS — M549 Dorsalgia, unspecified: Secondary | ICD-10-CM

## 2022-07-05 DIAGNOSIS — M539 Dorsopathy, unspecified: Secondary | ICD-10-CM

## 2022-07-05 DIAGNOSIS — M47817 Spondylosis without myelopathy or radiculopathy, lumbosacral region: Secondary | ICD-10-CM

## 2022-07-05 NOTE — Progress Notes (Signed)
PAIN CLINIC FOLLOW UP TELEMEDICINE NOTE    Primary Care Physician Laurine Blazer    SUBJECTIVE:  This is a 71 year old male presenting for a tele video visit with urothelial Stephen [name] s/p chemo and surgery withchronic neck and low back pain s/pALIF L5-S15/18/22now s/p revision (L2-pelvis posterior fusion) with Dr. Lelon Huh on 77/82/42 last seen by our service on 03/03/22 presenting with complaints of low back and right leg pain. Back pain (R>L) is located at the level of the belt line with radiation into the right thigh to the level of the knee. Also complains of paresthesias in his legs and feet.  This is complicated by neuropathy diagnosis. Leg paresthesias are associated with "heaviness" in the calves.  Reports a recent incident resulting in a fall secondary to legs giving out.  Denies any injuries. Pain is made worse with lying flat> standing and walking.  Unclear what makes pain better.  He underwent lumbar trigger point injections on 03/24/22 with no benefit.  He has engaged in physical therapy with mixed results.  Continues to do home exercise program.    Pain Treatments  Previous pain procedures and response include:   -03/24/22 repeat bilateral lumbar trigger point injections (no relief)  -07/22/21 Lumbar TPIs (minimal relief)  -05/31/19 left C4-C 6 diagnostic MBB (80% relief)  -RFA LeftL3-L512/9/19(did not provide much relief)  -Left L3-5 MBB 06/26/2018  -Cervical ESI 01/23/2018  -Lumbar ESI 11/24/2018  Pain medication  -Tylenol as needed  -Lyrica '100mg'$  at bedtime  -Cymbalta 20 mg daily  -Tizanidine    [facility] Return Patient Questionnaire  Main reason for today's visit: discuss lower back pain and legs and left shoulder and neck pain  Any other concerns: missing my cancer treatments due to drug shortages  Have you had a pain procedure since your last visit? No  What procedure(s)?    How much relief have pain procedures provided?    Since your LAST visit to the pain clinic until NOW, how much  relief have pain medications provided? 0% (no relief)  Over the last 12 months, how many sessions of physical therapy have you done? Zero  Of those sessions of physical therapy, how many were over the last 6 months?    If you had physical therapy, did it help?    Do you do home exercises? Yes  What exercises do you do? walking stretches  What does the pain feel like? Pressure;Tingling;Aching;Numbing;Throbbing;Radiating;Cramping;Squeezing;Itching;Pinching;Heavy  Do you have? Weakness;Muscle Spasms;Tingling;Coldness;Tightness  Does the pain make it hard for you to: Walk;Sleep;Exercise  What other things are hard for you to do?    Rate your pain at its worst in the past week: 9  Rate your pain at its least in the past week: 7  Rate your pain on average in the past week: 8  What number best describes how, during the past week, pain has interfered with your enjoyment of life: 7  What number best describes how, during the past week, pain has interfered with your general activity: 7  PEG Score: 7  Have you had any new studies done to evaluate your pain since your last visit? Yes  Please list: MRI    Patient's significant social history is:   Social History     Socioeconomic History   . Marital status: Single     Spouse name: Not on file   . Number of children: Not on file   . Years of education: Not on file   .  Highest education level: Not on file   Occupational History   . Not on file   Tobacco Use   . Smoking status: Never   . Smokeless tobacco: Never   Vaping Use   . Vaping Use: Never used   Substance and Sexual Activity   . Alcohol use: No   . Drug use: Not Currently   . Sexual activity: Not Currently     Partners: Female   Other Topics Concern   . Military Service No   . Blood Transfusions Yes   . Caffeine Concern No   . Occupational Exposure No   . Hobby Hazards No   . Sleep Concern Yes     Comment: due to my lower back discomfort and leg discomfort   . Stress Concern Yes     Comment: Health/ housing/ financial   .  Weight Concern No   . Special Diet No   . Back Care Yes     Comment: Am very careful with any activity   . Exercises Regularly Yes   . Seat Belt Use Yes   . Performs Self-Exams Yes   . Bike Helmet Use No   Social History Narrative   . Not on file     Social Determinants of Health     Financial Resource Strain: Not on file   Food Insecurity: Not on file   Transportation Needs: Not on file   Physical Activity: Not on file   Stress: Not on file   Social Connections: Not on file   Intimate Partner Violence: Not on file   Housing Stability: Not on file       Past medical history is significant for:   has a past medical history of Chronic back pain, Congenital hydronephrosis, Gout, Headache, Hematuria, HTN (hypertension) (12/10/2021), Kidney disease, Kidney stones, Major depressive disorder, single episode, Polyarthropathy or polyarthritis of multiple sites, Retinal detachment, and Urethral stricture.    He has no past medical history of AF (atrial fibrillation) (CMS-HCC), Asthma, Chronic kidney disease (CKD), Chronic obstructive airway disease (CMS-HCC), Congestive heart failure (CMS-HCC), Heart disease, or Hypothyroidism.    Current medications include:  Current Outpatient Medications   Medication Sig Dispense Refill   . albuterol 108 (90 Base) MCG/ACT inhaler Inhale 2 puffs by mouth every 4 hours as needed for Wheezing. 6.7 g 0   . allopurinol (ZYLOPRIM) 100 MG tablet Take 1 tablet (100 mg) by mouth daily. 30 tablet 0   . aluminum-magnesium-simethicone (MAG-AL PLUS) 876-811-57 MG/5ML suspension Take 30 mL by mouth every 6 hours as needed for Heartburn. 240 mL 0   . ascorbic acid (VITAMIN C) 500 MG tablet Take 1 tablet (500 mg) by mouth daily.     . bisacodyl (DULCOLAX) 10 MG suppository Insert 1 suppository (10 mg) rectally daily as needed for Constipation. 10 suppository 0   . celecoxib (CELEBREX) 200 MG capsule Take 1 capsule (200 mg) by mouth 2 times daily for 7 days. 14 capsule 0   . ciclopirox (LOPROX) 0.77 % GEL  Apply topically 2 times daily.     . diazepam (VALIUM) 5 MG tablet Take 1 tablet (5 mg) by mouth every 6 hours as needed for Anxiety (Take 30-60 minutes prior to MRI.  You will need a driver). 1 tablet 0   . docusate sodium (COLACE) 250 MG capsule Take 1 capsule (250 mg) by mouth 2 times daily. 60 capsule 0   . DULoxetine (CYMBALTA) 20 MG CR capsule Take 1 capsule (20 mg) by  mouth daily. 90 capsule 3   . heparin 100 UNIT/ML injection 1 mL (100 Units) by IntraCATHETER route every 6 hours as needed (PICC care). 30 each 0   . heparin 5000 UNIT/ML injection Inject 1 mL (5,000 Units) under the skin every 8 hours. 90 each 0   . ketotifen (ALAWAY) 0.025 % ophthalmic solution Place 1 drop into both eyes 2 times daily.     Marland Kitchen lidocaine (ASPERCREME) 4 % patch Apply 1 patch topically every 24 hours. Leave patch on for 12 hours, then remove for 12 hours. 30 patch 0   . Loratadine (ALAVERT PO) Take 10 mg by mouth daily.     Marland Kitchen LORazepam (ATIVAN) 1 MG tablet Place 1/2 tab Sublingual 30 minutes prior to procedure, may repeat once. Do not exceed 1 tablet 1 tablet 0   . melatonin 5 MG tablet Take 1 tablet (5 mg) by mouth at bedtime. 30 tablet 0   . methylPREDNISolone (MEDROL DOSEPACK) 4 MG tablet Take as directed on package 1 each 0   . Multiple Vitamins-Minerals (MULTIVITAMIN WITH MINERALS) TABS tablet Take 1 tablet by mouth daily. 30 tablet 1   . naloxone (NARCAN) 4 mg/0.1 mL nasal spray For suspected opioid overdose, call 911! Then spray once in one nostril. Repeat after 3 minutes if no or minimal response using a new spray in other nostril. 2 each 0   . nitrofurantoin monohydrate (MACROBID) 100 MG capsule Take 1 capsule (100 mg) by mouth 2 times daily. 20 capsule 0   . ondansetron (ZOFRAN ODT) 8 MG disintegrating tablet Take 1 tablet (8 mg) on or under the tongue every 8 hours as needed for Nausea/Vomiting. 10 tablet 0   . oxyCODONE (ROXICODONE) 5 MG immediate release tablet Take 1 tablet (5 mg) by mouth every 4 hours as needed  for Severe Pain (Pain Score 7-10) for up to 5 doses. 5 tablet 0   . oxyCODONE (ROXICODONE) 5 MG immediate release tablet Take 1 tablet (5 mg) by mouth 2 times daily as needed for Moderate Pain (Pain Score 4-6) or Severe Pain (Pain Score 7-10). 30 tablet 0   . Polyethyl Glycol-Propyl Glycol (SYSTANE ULTRA OP)      . polyethylene glycol (GLYCOLAX) 17 GM/SCOOP powder Take 17 g by mouth daily. Mix with 4 to 8 ounces of fluid (water, juice, soda, coffee, or tea) prior to administration. 255 g 0   . pregabalin (LYRICA) 100 MG capsule Take 1 capsule (100 mg) by mouth at bedtime. 30 capsule 2   . senna (SENOKOT) 8.6 MG tablet Take 1 tablet (8.6 mg) by mouth daily. 30 tablet 0   . sodium chloride 0.9 % flush 10 mL by IntraCATHETER route every 8 hours. 90 each 0   . tamsulosin (FLOMAX) 0.4 MG capsule TAKE 1 CAPSULE BY MOUTH EVERY DAY 90 capsule 1   . tizanidine (ZANAFLEX) 2 MG tablet Take 1 tablet (2 mg) by mouth every 8 hours as needed (spasm). (Patient taking differently: Take 2 tablets (4 mg) by mouth every 8 hours as needed (spasm).) 40 tablet 0   . traZODone (DESYREL) 50 MG tablet Take 0.5 tablets (25 mg) by mouth at bedtime. 30 tablet 0   . triamcinolone (KENALOG) 0.1 % cream Apply 1 Application. topically 2 times daily. Apply a thin layer as directed     . VITAMIN D PO Take 125 mg by mouth daily.       No current facility-administered medications for this visit.  Patient's current allergies are:  Sulfa drugs    OBJECTIVE:  Physical Exam  General: no distress, cooperative, pleasant  Mental Status:  alert, oriented x 3. Speech is clear/ normal  Affect: euthymic  Eyes:  Pupils not pinpoint.  CV:  No visible extremity edema.  Respiratory:  Breathing easily without tachypnea or bradypnea.  GI/Abdomen: Not-distended.  Skin: No rash visible    EXAM DESCRIPTION:  MRI LUMBAR SPINE WO/W IV CONTRAST: 12/03/2021   CLINICAL HISTORY:  "Urinary retention, new onset."    TECHNIQUE:  Multiplanar, multisequence MRI of the lumbar  spine was performed prior to and following intravenous administration of gadolinium contrast on a 1.5 Tesla scanner.    Contrast: 10 mL Gadavist (gadobutrol) IV.    COMPARISON:  Lumbosacral spine radiograph 11/23/2021, CT L-spine 11/01/2021, MRI lumbar spine 10/29/2021.    FINDINGS:  Please see the impression.    IMPRESSION:  1. The last well-formed disc is labeled L5-S1, similar to prior lumbar spine MRI.    2. Since the prior lumbar spine MRI, interval partial L2 and complete L3 laminectomies as well as L2-3 interbody fusion. Interval revision posterior fixation, now from L2-S1. There are additional postsurgical changes related to past L4 laminectomy, L3-4 and L4-5 interbody fusions, and L5-S1 anterior interbody fusion. There is a postsurgical 9.8 x 4.1 cm fluid collection extending from L2-L5 with mild rim enhancement.    3. Retrolisthesis of L2 on L3 has decreased from 5 to 3 mm while grade 2 anterolisthesis of L5 on S1 remains stable at 10 mm.    4. L2-3 spinal canal narrowing has substantially improved with mild residual overall narrowing. However, the previously seen right paracentral-subarticular inferiorly-directed disc extrusion has enlarged from 15 x 6 to 17 x 7 mm (CC x AP; Se 2, Im 8), associated with complete right lateral recess narrowing. Edema within the extruded component compatible with recent progression. Mild left and mild-moderate right foraminal narrowing, improved on both sides from prior lumbar spine MRI.    5. L3-4 spinal canal narrowing has improved with no residual. Mild left foraminal narrowing, slightly improved. Mild-moderate right foraminal narrowing, slightly improved.    6. No significant spinal canal narrowing in the remaining visualized thoracic and lumbar levels. Persistent moderate-severe left and moderate right L4-5 and severe bilateral L5-S1 foraminal narrowing.    7. There is marrow edema in the L2 vertebral body and L3 superior endplate, likely related to stress  reaction and degenerative changes. Infectious process felt to be less likely. No definite abnormal intrathecal enhancement, although evaluation is limited by fixation and motion artifacts.      EXAM DESCRIPTION:  MRI CERVICAL SPINE POST CONTRAST ONLY:06/04/2022 1:32 pm    CLINICAL HISTORY:  71 year old male with abnormal MRI on 05/07/2022    TECHNIQUE:  T1 sequences were performed on a 3 Tesla  following 8 cc of Gadavist    COMPARISON:  MRI of the cervical spine dated 05/07/2022    FINDINGS:  No abnormal enhancement is seen.      IMPRESSION:  Specific attention was directed to the focal area of T2 hyperintensity with the spinal cord at the level of C4. No evidence of abnormal enhancement is identified. This finding most likely represents a prominent central canal.    No interval change in the multilevel degenerative disc disease.    ASSESSMENT  Encounter Diagnoses   Name Primary?   . Lumbar radiculopathy Yes   . Lumbosacral spondylosis without myelopathy    . S/P lumbar fusion    .  Degenerative spondylolisthesis    . Dorsopathy    . Urothelial carcinoma of kidney, left (CMS-HCC)    . Lumbar stenosis with neurogenic claudication    . Trigger point with back pain      This is a 71 year old male presenting for a tele video visit with urothelial Roll[place] s/p chemo and surgery withchronic neck and low back pain s/pALIF L5-S15/18/22now s/p revision (L2-pelvis posterior fusion) with Dr. Lelon Huh on 84/69/62 last seen by our service on 03/03/22 presenting with complaints of low back and right leg pain.  Recent lumbar trigger point injections provided no relief of pain.  Back and leg pain are equally as troublesome.  Leg pain complicated by history of neuropathy.  Recommendation is to undergo caudal ESI.    Patient aware that examination and assessment may be limited due to the nature of this telemedicine consultation visit and the inability to do a hands-on physical examination.    PLAN  Medication Plan:   No  changes    Procedure Plan:   -Caudal ESI (ordered today)    Other:  -Continue to do exercises learned in PT    Follow-up:   2-4 weeks post Caudal ESI for re-evaluation    Dr. Trinna Post is the attending physician in the clinic and was immediately available.       ---------------------(data below generated by Elvera Bicker, PA)--------------------    Patient Verification & Telemedicine Consent:    I am proceeding with this evaluation at the direct request of the patient.  I have verified this is the correct patient and have obtained verbal consent and written consent from the patient/ surrogate to perform this voluntary telemedicine evaluation (including obtaining history, performing examination and reviewing data provided by the patient).   The patient/ surrogate has the right to refuse this evaluation.  I have explained risks (including potential loss of confidentiality), benefits, alternatives, and the potential need for subsequent face to face care. Patient/ surrogate understands that there is a risk of medical inaccuracies given that our recommendations will be made based on reported data (and we must therefore assume this information is accurate).  Knowing that there is a risk that this information is not reported accurately, and that the telemedicine video, audio, or data feed may be incomplete, the patient agrees to proceed with evaluation and holds [name] harmless knowing these risks. In this evaluation, we will be providing recommendations only.  The ultimate decision to follow, or not follow, these recommendations will be left to the bedside treating/ requesting practitioner.  The patient/ surrogate has been notified that other healthcare professionals (including students, residents and Metallurgist) may be involved in this audio-video evaluation.   All laws concerning confidentiality and patient access to medical records and copies of medical records apply to telemedicine.  The patient/ surrogate has  received the [place] Notice of Privacy Practices.  I have reviewed this above verification and consent paragraph with the patient/ surrogate.  If the patient is not capacitated to understand the above, and no surrogate is available, since this is not an emergency evaluation, the visit will be rescheduled until such time that the patient can consent, or the surrogate is available to consent.    Demographics:   Medical Record #: [id number]   Date: July 05, 2022   Patient Name: Stephen [name]   DOB: [birth year]  Age: 72 year old  Sex: male  Location: Home address on file      Evaluator(s):   Lynnell Chad  Chervenak was evaluated by me today.    Clinic Location:  [facility]  [address]  [place] [address]    Total duration of encounter spent in pre-visit (reviewing last visit, reviewing prior Epic notes and reviewing images), intra-visit (updating relevant history, performing a video-based physical exam, creating a treatment plan and medical discussion with patient), and post-visit (note completion and placing of orders) on the day of the encounter, excluding separately reportable services/procedures: 30 minutes.

## 2022-07-06 NOTE — Telephone Encounter (Signed)
Caller: Patient  Phone # [phone]    Notes:     Patient is returning Paulina's call, was advised she will call him back today or tomorrow, she is waiting on scheduling instructions     Thank youu

## 2022-07-06 NOTE — Telephone Encounter (Signed)
Called pt, left message to call back and schedule BCG.

## 2022-07-07 ENCOUNTER — Encounter (HOSPITAL_BASED_OUTPATIENT_CLINIC_OR_DEPARTMENT_OTHER): Payer: Self-pay | Admitting: Urology

## 2022-07-07 MED ORDER — DIAZEPAM 5 MG OR TABS
5.0000 mg | ORAL_TABLET | ORAL | 1 refills | Status: AC
Start: 2022-07-07 — End: 2022-07-07

## 2022-07-08 ENCOUNTER — Other Ambulatory Visit (HOSPITAL_BASED_OUTPATIENT_CLINIC_OR_DEPARTMENT_OTHER): Payer: Self-pay | Admitting: Diagnostic Radiology

## 2022-07-08 ENCOUNTER — Telehealth (HOSPITAL_BASED_OUTPATIENT_CLINIC_OR_DEPARTMENT_OTHER): Payer: Self-pay

## 2022-07-08 ENCOUNTER — Other Ambulatory Visit (HOSPITAL_BASED_OUTPATIENT_CLINIC_OR_DEPARTMENT_OTHER): Payer: Self-pay | Admitting: Physician Assistant

## 2022-07-08 DIAGNOSIS — C662 Malignant neoplasm of left ureter: Secondary | ICD-10-CM

## 2022-07-08 NOTE — Telephone Encounter (Signed)
Called pt, appt scheduled.

## 2022-07-08 NOTE — Telephone Encounter (Signed)
Pt returned call from [place] to sched neph tube check.  Warm transferred to [facility].

## 2022-07-08 NOTE — Telephone Encounter (Signed)
Clinic still needed: no  LVM to schedule procedure: Neph tube check  Is time held: no

## 2022-07-15 ENCOUNTER — Ambulatory Visit
Admission: RE | Admit: 2022-07-15 | Discharge: 2022-07-15 | Disposition: A | Discharge status: Home / Self Care | Payer: Medicare Other | Attending: Urology | Admitting: Urology

## 2022-07-15 ENCOUNTER — Other Ambulatory Visit (HOSPITAL_BASED_OUTPATIENT_CLINIC_OR_DEPARTMENT_OTHER): Payer: Self-pay | Admitting: Urology

## 2022-07-15 ENCOUNTER — Ambulatory Visit (HOSPITAL_BASED_OUTPATIENT_CLINIC_OR_DEPARTMENT_OTHER)
Admit: 2022-07-15 | Discharge: 2022-07-15 | Disposition: A | Discharge status: Home Health | Payer: Medicare Other | Attending: Urology | Admitting: Urology

## 2022-07-15 DIAGNOSIS — C678 Malignant neoplasm of overlapping sites of bladder: Secondary | ICD-10-CM

## 2022-07-15 DIAGNOSIS — K8689 Other specified diseases of pancreas: Secondary | ICD-10-CM

## 2022-07-15 DIAGNOSIS — C662 Malignant neoplasm of left ureter: Secondary | ICD-10-CM

## 2022-07-15 DIAGNOSIS — C679 Malignant neoplasm of bladder, unspecified: Secondary | ICD-10-CM

## 2022-07-15 DIAGNOSIS — N1330 Unspecified hydronephrosis: Secondary | ICD-10-CM

## 2022-07-15 DIAGNOSIS — Z905 Acquired absence of kidney: Secondary | ICD-10-CM

## 2022-07-15 DIAGNOSIS — Z960 Presence of urogenital implants: Secondary | ICD-10-CM

## 2022-07-18 ENCOUNTER — Telehealth (HOSPITAL_BASED_OUTPATIENT_CLINIC_OR_DEPARTMENT_OTHER): Payer: Self-pay | Admitting: Urology

## 2022-07-18 ENCOUNTER — Telehealth (HOSPITAL_BASED_OUTPATIENT_CLINIC_OR_DEPARTMENT_OTHER): Payer: Self-pay

## 2022-07-18 ENCOUNTER — Ambulatory Visit: Payer: Medicare Other | Attending: Urology

## 2022-07-18 VITALS — BP 110/67 | HR 70 | Temp 98.0°F | Resp 16

## 2022-07-18 DIAGNOSIS — C642 Malignant neoplasm of left kidney, except renal pelvis: Secondary | ICD-10-CM | POA: Insufficient documentation

## 2022-07-18 MED ORDER — SODIUM CHLORIDE (PF) 0.9 % IJ SOLN
25.0000 mg | Freq: Once | INTRAVESICAL | Status: DC
Start: 2022-07-18 — End: 2022-07-18
  Filled 2022-07-18: qty 25

## 2022-07-18 MED ORDER — LIDOCAINE HCL 2% EX GEL (UROJET)
Status: AC
Start: 2022-07-18 — End: 2022-07-18
  Filled 2022-07-18: qty 10

## 2022-07-18 MED ORDER — LIDOCAINE HCL 2% EX GEL (UROJET)
10.0000 mL | Freq: Once | Status: DC | PRN
Start: 2022-07-18 — End: 2022-07-18
  Administered 2022-07-18: 10 mL via URETHRAL

## 2022-07-18 NOTE — Telephone Encounter (Signed)
[place] / Medical Oncology Social Work Telephone note:    [name] called today and left me a message that today was his first infusion, 1 of 6.    I returend his call while he was on his way home, stuck in traffic.  He presented as talkative and receptive.    [name] said he was pleased with good news, as his CT was negative for metastases.  They are still waiting for MRI results.    [name] says that his first infusion went well today.    He is on track with his treatment and seems to have a good understanding of the plan.    [name] and I agreed to stay in touch.  He is welcome to call me back anytime.  He is a good self advocate and reaches out when needed.    Will follow PRN.    [name], [place] Social Worker  [phone]

## 2022-07-18 NOTE — Interdisciplinary (Signed)
Patient arrived ambulatory, alert and without signs or symptoms of distress     Patient presents for his 1st dose of BCG. Patient voided 30 cc urine, no gross hematuria noted.  Patient denies s/s of UTI and is feeling well. VSS.      Procedure was explained by nurse and understood by patient.      Handout re: BCG given to patient.      Patient has a nephrostomy tube and urinates very little from his urethra. He is ok with having a foley placed and clamped for 2 hours. Area prepped with Betadine in sterile technique. Lidocaine 2% topical jelly instilled into urethra. [place] foley inserted in standard sterile procedure, bladder drained. BCG 25 mg in 50 ml normal saline instilled into bladder. Foley clamped at 2:20 pm    Foley drained of BCG solution at 4:20 pm.       Procedure well tolerated by the patient.     Information/teaching given to patient including s/s to expect, symptom control and when to notify MD vs go to ER. Patient verbalized understanding.      Patient discharged to home in stable, ambulatory condition.

## 2022-07-18 NOTE — Telephone Encounter (Signed)
Dr. Mina Marble,  Please advise if it will be a cysto 6-8weeks after last bcg treatment? Or a reg f/u?  If cysto, reg or BL?  Thanks

## 2022-07-18 NOTE — Telephone Encounter (Signed)
Left patient a voice message to schedule a MCVV with Dr. Sallyanne Kuster after 9/5. Please schedule.

## 2022-07-22 ENCOUNTER — Encounter (HOSPITAL_BASED_OUTPATIENT_CLINIC_OR_DEPARTMENT_OTHER): Payer: Self-pay | Admitting: Urology

## 2022-07-22 ENCOUNTER — Telehealth (HOSPITAL_BASED_OUTPATIENT_CLINIC_OR_DEPARTMENT_OTHER): Payer: Self-pay | Admitting: Urology

## 2022-07-24 ENCOUNTER — Encounter: Payer: Self-pay | Admitting: Cardiovascular Disease

## 2022-07-24 ENCOUNTER — Telehealth (HOSPITAL_BASED_OUTPATIENT_CLINIC_OR_DEPARTMENT_OTHER): Payer: Self-pay | Admitting: Urology

## 2022-07-24 NOTE — Telephone Encounter (Signed)
Called patient. He stated he was started on levaquin by PCP. He is not 100% sure if he was having UTI symptoms at that time.  He denies any fevers or urinary dysuria at that time.  However he did say he has some urinary frequency that possibly improve the little bit after he took the Levaquin.  He took it for 3 to 4 days including today.  On further inquiry, he thinks he likely did not have a UTI and that the symptoms are perhaps related to the nephrostomy tube being there for too long.  He is scheduled for nephrostomy tube change and states he will ask the interventional radiologist if they can change the tube more frequently.    Discussed that given lack of convincing UTI symptoms, he can stop levofloxacin after the doses he has already received, given that this drug has been reported in some literature to decrease the efficacy of BCG. Discussed that the alternative would be to continue his levofloxacin, with the theoretical risk of decreased BCG efficacy.  He opted for the 1st option of discontinuing levofloxacin at this time and closely monitoring his symptoms.    He thanked me for the call.

## 2022-07-25 ENCOUNTER — Encounter: Payer: Self-pay | Admitting: Family Medicine

## 2022-07-25 ENCOUNTER — Ambulatory Visit: Payer: Medicare Other | Attending: Urology

## 2022-07-25 ENCOUNTER — Encounter (HOSPITAL_BASED_OUTPATIENT_CLINIC_OR_DEPARTMENT_OTHER): Payer: Self-pay | Admitting: Urology

## 2022-07-25 VITALS — BP 105/64 | HR 83 | Temp 98.0°F | Resp 16 | Ht 67.0 in

## 2022-07-25 DIAGNOSIS — C642 Malignant neoplasm of left kidney, except renal pelvis: Secondary | ICD-10-CM | POA: Insufficient documentation

## 2022-07-25 MED ORDER — LIDOCAINE HCL 2% EX GEL (UROJET)
10.0000 mL | Freq: Once | Status: DC | PRN
Start: 2022-07-25 — End: 2022-07-25

## 2022-07-25 MED ORDER — SODIUM CHLORIDE (PF) 0.9 % IJ SOLN
16.7000 mg | Freq: Once | INTRAVESICAL | Status: AC
Start: 2022-07-25 — End: 2022-07-25
  Administered 2022-07-25: 17 mg via INTRAVESICAL
  Filled 2022-07-25: qty 17

## 2022-07-25 MED ORDER — ATORVASTATIN CALCIUM 80 MG PO TABS: ORAL_TABLET | ORAL | 1 refills | Status: DC

## 2022-07-25 MED ORDER — SILDENAFIL CITRATE 20 MG PO TABS
40.0000 mg | ORAL_TABLET | Freq: Every day | ORAL | 5 refills | Status: DC | PRN
Start: 2022-07-25 — End: 2023-01-23

## 2022-07-25 NOTE — Patient Instructions (Signed)
BCG Treatment for Bladder Cancer    WHAT IS BCG?  Bacillus Calmette-Guerin (BCG) is a type of bacterium used in bladder cancer to prevent the cancer from coming back after surgery. It is placed into the bladder through a catheter (tube). BCG is also given as a vaccine for tuberculosis (TB) in some countries.    HOW DOES BCG WORK?  When given in the bladder, BCG causes an immune response that brings disease- fighting cells to the bladder. Your immune system can then fight against the cancer cells that are found in the lining of the bladder.    WHAT IS THE USUAL COURSE OF TREATMENT?  Once a week for 6 weeks or once a week for 3 weeks or monthly, as directed by your physician. After your last treatment, you will be scheduled for a cystoscopy in 4-6 weeks.    WHAT DO I NEED TO TELL MY PROVIDER BEFORE I RECEIVE BCG?  If you have ever had an bad reaction to BCG  If you have any of these health problems:  Active TB  Any cancer beside bladder cancer  HIV/AIDS  Weak immune system  Any infection or fever, or if you are taking antibiotics  Blood in the urine  Problems with urinary retention or incontinence  If you are receiving cancer therapy (for example, chemotherapy, radiation) or medications that suppress your immune system (for example, steroids).  If you are taking a diuretic (water pill).  If you had a biopsy, transuretheral resection of bladder tumor (TURBT), or bladder catheterization that caused injury or bleeding within the past two weeks.  If you are pregnant or may become pregnant during treatment. Do not become pregnant while on therapy.  If you are breastfeeding or plan to breastfeed during treatment. We recommend stopping breastfeeding while on BCG.    BEFORE BCG TREATMENT  Do not drink caffeine or alcohol on treatment days.  Purchase household bleach. You will need it at home after treatment to prevent BCG from entering the water system.  Do not drink fluids for four (4) hours before treatment.  Please  bring an absorbent pad or underwear if you normally experience leaks.    WHAT CAN I EXPECT DURING BCG TREATMENT?  BCG will be given in a treatment room at the Urology Clinic or infusion center.  Do not drink fluids for the two (2) hours while BCG is in your bladder.  If you feel any discomfort during treatment, notify your nurse immediately.  Positioning: The bladder is an expandable sac-like organ that contracts when it is empty. The inner lining of the bladder tucks in the folds and expands out to accommodate liquid. For this reason, no turning or lying is necessary. You may position yourself in whatever position you prefer while at home.  At each visit for BCG treatment, you will be asked for a urine sample. If you have signs or symptoms of a bladder infection or blood in the urine, you will not receive BCG that day.  If you are having treatment you will be asked to undress from the waist down and given a sheet to cover yourself.  The nurse will ask you to lie on your back on the bed; they will clean your genital area.  A catheter will be placed into your urethra (tube that carries urine out of your body). This catheter will let any urine drain from your bladder. Once the urine is drained, BCG will be placed into your bladder through the same catheter.    Lidocaine gel, a numbing medication, may be used to make the catheter placement more comfortable.  After BCG is given, the catheter is removed and you will be allowed to go home.  You will hold the BCG solution in your bladder for two (2) hours; you may urinate (pee) sooner if absolutely necessary.  After 2 hours, urinate in your home toilet.  Both men and women should sit on the toilet to urinate to prevent splashing of urine.  Do not flush the toilet! Disinfect urine for 15 minutes with two (2) cups of household bleach before flushing.  Wash your hands thoroughly with warm water and soap.    Follow these same instructions every time you urinate for the first six  (6) hours, and plan to stay home and use the same toilet for those six (6) hours.    AFTER BCG TREATMENT  You may return to your normal activities and social contact. You do not need to restrict your movements and regular routine. You can eat and drink as you normally do.  Drink plenty of fluids after treatment to flush BCG out of your bladder.  If urine contacts your skin, thoroughly wash the area with soap and water.  If any clothes or linens have urine on them, wash separately in a washing machine on the hot cycle with bleach.  If urine splashes or spills on the floor, toilet seat, or other areas, cover the area with bleach-soaked paper towels for fifteen (15) minutes, then clean with soap and water.  If you are sexually active, you or your partner must wear a condom during the treatment course and for one (1) week after treatment has ended.    SIDE EFFECTS  You may feel painful or difficult urination, burning, urgency, frequency or see blood in urine (hematuria).  Flu-like symptoms: Fever, malaise, chills, fatigue, generalized aches and pains    Most patients have mild symptoms of bladder irritation. You may also have flu-like symptoms. Symptoms usually begin 4 to 6 hours after BCG is given and last 2 to 3 days after treatment. Side effects may be worse each time you have a BCG treatment.    The following symptoms may be signs of a rare, but serious systemic BCG infection. Contact your provider or nurse Urology Clinic ([phone]), on-call urologist ([phone]) during weekends, nights, and holidays,  or go to the emergency room immediately if you experience any of the following symptoms:  Fever higher than 103?F anytime OR Fever higher than 101.3?F that lasts for more than 48 hours  Increase in urinary symptoms and flu-like symptoms lasting more than 48 hours  Signs of liver problems like dark urine, feeling tired, not hungry, upset stomach or stomach pain, light-colored stools, throwing up, or yellow skin  or eyes  Joint pain, skin rash, cough  Extreme fatigue (unable to take care of yourself)    Call 911 immediately if you experience any of the following symptoms:  Shortness of breath  Confusion  Dizziness or lightheadedness

## 2022-07-25 NOTE — Interdisciplinary (Signed)
Patient arrived ambulatory, alert and without signs or symptoms of distress     Patient presents for 2nd dose of BCG. Patient voided 30 cc urine, no gross hematuria noted. Patient denies s/s of UTI, dysuria/frequency in urination/flank/suprapubic pain. VSS. Patient stopped Levaquin per Dr. Leland Her instruction.      Procedure was explained by nurse and understood by patient.      Area prepped with Betadine in sterile technique. BCG '17mg'$  in 15 ml normal saline instilled into nephrostomy tube. BCG aspirated from nephrostomy tube after 2 hours. BCG instillation at 1400. BCG aspirated at 1600. Dressing changed.    When BCG was aspirated, initially it was yellow and then it started to become pink but then it resolved and became clear yellow again. A total of 59m was aspirated. Dr. BSallyanne Kusternotified but no response after 30 minutes. After 15 minutes, more urine came out of the patient's nephrostomy tube and it was clear yellow. Discussed further with PA SGershon Craneand patient is ok to discharge. Patient discharged home with ED precautions.      Procedure well tolerated by the patient.     Information/teaching given to patient including s/s of infection/bleeding/symptom control and when to notify MD, patient verbalized understanding.      Patient discharged to home in stable condition.

## 2022-07-27 ENCOUNTER — Other Ambulatory Visit (HOSPITAL_BASED_OUTPATIENT_CLINIC_OR_DEPARTMENT_OTHER): Payer: Self-pay | Admitting: Anesthesiology

## 2022-07-27 ENCOUNTER — Other Ambulatory Visit (HOSPITAL_BASED_OUTPATIENT_CLINIC_OR_DEPARTMENT_OTHER): Payer: Self-pay | Admitting: Nurse Practitioner

## 2022-07-27 DIAGNOSIS — M47817 Spondylosis without myelopathy or radiculopathy, lumbosacral region: Secondary | ICD-10-CM

## 2022-07-27 DIAGNOSIS — Z981 Arthrodesis status: Secondary | ICD-10-CM

## 2022-07-28 MED ORDER — DULOXETINE HCL 20 MG OR CPEP
20.0000 mg | ORAL_CAPSULE | Freq: Every day | ORAL | 3 refills | Status: DC
Start: 2022-07-28 — End: 2023-05-29

## 2022-07-28 MED ORDER — PREGABALIN 100 MG OR CAPS
100.00 mg | ORAL_CAPSULE | Freq: Every evening | ORAL | 2 refills | Status: DC
Start: 2022-07-28 — End: 2022-08-23

## 2022-07-28 NOTE — Telephone Encounter (Signed)
Lyrica refilled

## 2022-07-28 NOTE — Telephone Encounter (Signed)
[facility] / Medical Oncology Social Work Telephone note:    Left Stephen [name] a message taht I will be away from the clinic until 8/21, and he can contact me as needed after that date.    [name], [place] Social Worker  [phone]

## 2022-07-28 NOTE — Telephone Encounter (Signed)
Routed to provider to review triage below and refill request     DOS: N/A  LOV:05/26/2022 Cervical stenosis of spine  NOV: None  LRD: 06/27/2022

## 2022-07-29 ENCOUNTER — Encounter (HOSPITAL_BASED_OUTPATIENT_CLINIC_OR_DEPARTMENT_OTHER): Payer: Self-pay | Admitting: Urology

## 2022-08-01 ENCOUNTER — Ambulatory Visit: Payer: Medicare Other | Attending: Urology

## 2022-08-01 ENCOUNTER — Other Ambulatory Visit (HOSPITAL_BASED_OUTPATIENT_CLINIC_OR_DEPARTMENT_OTHER): Payer: Self-pay | Admitting: Anesthesiology

## 2022-08-01 VITALS — BP 110/62 | HR 57 | Temp 97.3°F | Resp 17 | Wt 187.0 lb

## 2022-08-01 DIAGNOSIS — C642 Malignant neoplasm of left kidney, except renal pelvis: Secondary | ICD-10-CM | POA: Insufficient documentation

## 2022-08-01 DIAGNOSIS — M5416 Radiculopathy, lumbar region: Secondary | ICD-10-CM

## 2022-08-01 MED ORDER — SODIUM CHLORIDE (PF) 0.9 % IJ SOLN
17.0000 mg | Freq: Once | INTRAVESICAL | Status: AC
Start: 2022-08-01 — End: 2022-08-01
  Administered 2022-08-01: 17 mg via INTRAVESICAL
  Filled 2022-08-01: qty 17

## 2022-08-01 MED ORDER — LIDOCAINE HCL 2% EX GEL (UROJET)
10.0000 mL | Freq: Once | Status: DC | PRN
Start: 2022-08-01 — End: 2022-08-01

## 2022-08-01 NOTE — Patient Instructions (Signed)
BCG Treatment for Bladder Cancer    WHAT IS BCG?  Bacillus Calmette-Guerin (BCG) is a type of bacterium used in bladder cancer to prevent the cancer from coming back after surgery. It is placed into the bladder through a catheter (tube). BCG is also given as a vaccine for tuberculosis (TB) in some countries.    HOW DOES BCG WORK?  When given in the bladder, BCG causes an immune response that brings disease- fighting cells to the bladder. Your immune system can then fight against the cancer cells that are found in the lining of the bladder.    WHAT IS THE USUAL COURSE OF TREATMENT?  Once a week for 6 weeks or once a week for 3 weeks or monthly, as directed by your physician. After your last treatment, you will be scheduled for a cystoscopy in 4-6 weeks.    WHAT DO I NEED TO TELL MY PROVIDER BEFORE I RECEIVE BCG?  If you have ever had an bad reaction to BCG  If you have any of these health problems:  Active TB  Any cancer beside bladder cancer  HIV/AIDS  Weak immune system  Any infection or fever, or if you are taking antibiotics  Blood in the urine  Problems with urinary retention or incontinence  If you are receiving cancer therapy (for example, chemotherapy, radiation) or medications that suppress your immune system (for example, steroids).  If you are taking a diuretic (water pill).  If you had a biopsy, transuretheral resection of bladder tumor (TURBT), or bladder catheterization that caused injury or bleeding within the past two weeks.  If you are pregnant or may become pregnant during treatment. Do not become pregnant while on therapy.  If you are breastfeeding or plan to breastfeed during treatment. We recommend stopping breastfeeding while on BCG.    BEFORE BCG TREATMENT  Do not drink caffeine or alcohol on treatment days.  Purchase household bleach. You will need it at home after treatment to prevent BCG from entering the water system.  Do not drink fluids for four (4) hours before treatment.  Please  bring an absorbent pad or underwear if you normally experience leaks.    WHAT CAN I EXPECT DURING BCG TREATMENT?  BCG will be given in a treatment room at the Urology Clinic or infusion center.  Do not drink fluids for the two (2) hours while BCG is in your bladder.  If you feel any discomfort during treatment, notify your nurse immediately.  Positioning: The bladder is an expandable sac-like organ that contracts when it is empty. The inner lining of the bladder tucks in the folds and expands out to accommodate liquid. For this reason, no turning or lying is necessary. You may position yourself in whatever position you prefer while at home.  At each visit for BCG treatment, you will be asked for a urine sample. If you have signs or symptoms of a bladder infection or blood in the urine, you will not receive BCG that day.  If you are having treatment you will be asked to undress from the waist down and given a sheet to cover yourself.  The nurse will ask you to lie on your back on the bed; they will clean your genital area.  A catheter will be placed into your urethra (tube that carries urine out of your body). This catheter will let any urine drain from your bladder. Once the urine is drained, BCG will be placed into your bladder through the same catheter.    Lidocaine gel, a numbing medication, may be used to make the catheter placement more comfortable.  After BCG is given, the catheter is removed and you will be allowed to go home.  You will hold the BCG solution in your bladder for two (2) hours; you may urinate (pee) sooner if absolutely necessary.  After 2 hours, urinate in your home toilet.  Both men and women should sit on the toilet to urinate to prevent splashing of urine.  Do not flush the toilet! Disinfect urine for 15 minutes with two (2) cups of household bleach before flushing.  Wash your hands thoroughly with warm water and soap.    Follow these same instructions every time you urinate for the first six  (6) hours, and plan to stay home and use the same toilet for those six (6) hours.    AFTER BCG TREATMENT  You may return to your normal activities and social contact. You do not need to restrict your movements and regular routine. You can eat and drink as you normally do.  Drink plenty of fluids after treatment to flush BCG out of your bladder.  If urine contacts your skin, thoroughly wash the area with soap and water.  If any clothes or linens have urine on them, wash separately in a washing machine on the hot cycle with bleach.  If urine splashes or spills on the floor, toilet seat, or other areas, cover the area with bleach-soaked paper towels for fifteen (15) minutes, then clean with soap and water.  If you are sexually active, you or your partner must wear a condom during the treatment course and for one (1) week after treatment has ended.    SIDE EFFECTS  You may feel painful or difficult urination, burning, urgency, frequency or see blood in urine (hematuria).  Flu-like symptoms: Fever, malaise, chills, fatigue, generalized aches and pains    Most patients have mild symptoms of bladder irritation. You may also have flu-like symptoms. Symptoms usually begin 4 to 6 hours after BCG is given and last 2 to 3 days after treatment. Side effects may be worse each time you have a BCG treatment.    The following symptoms may be signs of a rare, but serious systemic BCG infection. Contact your provider or nurse Urology Clinic ([phone]), on-call urologist ([phone]) during weekends, nights, and holidays,  or go to the emergency room immediately if you experience any of the following symptoms:  Fever higher than 103?F anytime OR Fever higher than 101.3?F that lasts for more than 48 hours  Increase in urinary symptoms and flu-like symptoms lasting more than 48 hours  Signs of liver problems like dark urine, feeling tired, not hungry, upset stomach or stomach pain, light-colored stools, throwing up, or yellow skin  or eyes  Joint pain, skin rash, cough  Extreme fatigue (unable to take care of yourself)    Call 911 immediately if you experience any of the following symptoms:  Shortness of breath  Confusion  Dizziness or lightheadedness

## 2022-08-01 NOTE — Interdisciplinary (Deleted)
Patient arrived ambulatory, alert and not in any form of distress.  Here for BCG #3rd treatment. Patient  voided 50 mL urine, no gross hematuria noted.  no s/s of UTI, denies any dysuria/frequency in urination/flank/suprapubic pain. VSS.   Procedure was explained by nurse and understood by patient.     Area prepped with Betadine in sterile technique. Lidocaine 2% topical jelly instilled into urethra then *** FR straight catheter inserted in standard sterile procedure, bladder drained followed by instillation of  BCG ** mg  In 50 ml normal saline into bladder prior to removal of catheter.  Patient was instructed to retain the solution in the bladder for a minimum 2 hrs., if possible, and to void it out anytime thereafter.       Procedure well tolerated by the patient.  Information/teaching given to patient including s/s of infection/bleeding/symptom control and when to notify MD, patient verbalized understanding.      Patient discharged to home in stable condition.

## 2022-08-01 NOTE — Interdisciplinary (Signed)
Patient arrived ambulatory, alert and without signs or symptoms of distress     Patient presents for 3rd dose of BCG. Patient voided 60 cc urine, no gross hematuria noted.  Patient denies s/s of UTI, dysuria/frequency in urination/flank/suprapubic pain. VSS.      Procedure was explained by nurse and understood by patient.      Area prepped with Betadine in sterile technique. BCG 17 mg in 15 ml normal saline instilled into nephrostomy tube. Patient was instructed to retain the solution in the nephrostomy tube for a minimum 2 hrs., After 2 hours, 34m of fluid was aspirated from nephrostomy tube.     Procedure well tolerated by the patient.     Information/teaching given to patient including s/s of infection/bleeding/symptom control and when to notify MD, patient verbalized understanding.      Patient discharged to home in stable condition.

## 2022-08-02 ENCOUNTER — Ambulatory Visit (HOSPITAL_BASED_OUTPATIENT_CLINIC_OR_DEPARTMENT_OTHER): Payer: Medicare Other

## 2022-08-02 ENCOUNTER — Ambulatory Visit
Admission: RE | Admit: 2022-08-02 | Discharge: 2022-08-02 | Disposition: A | Discharge status: Home / Self Care | Payer: Medicare Other | Source: Ambulatory Visit | Attending: Diagnostic Radiology | Admitting: Diagnostic Radiology

## 2022-08-02 ENCOUNTER — Ambulatory Visit (HOSPITAL_BASED_OUTPATIENT_CLINIC_OR_DEPARTMENT_OTHER)
Admission: RE | Admit: 2022-08-02 | Discharge: 2022-08-02 | Disposition: A | Discharge status: Home Health | Payer: Medicare Other | Attending: Anesthesiology | Admitting: Anesthesiology

## 2022-08-02 ENCOUNTER — Encounter (HOSPITAL_BASED_OUTPATIENT_CLINIC_OR_DEPARTMENT_OTHER)
Admission: RE | Disposition: A | Discharge status: Home Health | Payer: Self-pay | Source: Ambulatory Visit | Attending: Diagnostic Radiology

## 2022-08-02 ENCOUNTER — Ambulatory Visit (HOSPITAL_BASED_OUTPATIENT_CLINIC_OR_DEPARTMENT_OTHER): Admit: 2022-08-02 | Discharge: 2022-08-02 | Disposition: A | Discharge status: Home Health | Payer: Medicare Other

## 2022-08-02 ENCOUNTER — Encounter (HOSPITAL_BASED_OUTPATIENT_CLINIC_OR_DEPARTMENT_OTHER): Payer: Self-pay | Admitting: Diagnostic Radiology

## 2022-08-02 ENCOUNTER — Encounter (HOSPITAL_BASED_OUTPATIENT_CLINIC_OR_DEPARTMENT_OTHER): Admission: RE | Disposition: A | Discharge status: Home Health | Payer: Self-pay | Attending: Anesthesiology

## 2022-08-02 DIAGNOSIS — Z87442 Personal history of urinary calculi: Secondary | ICD-10-CM | POA: Insufficient documentation

## 2022-08-02 DIAGNOSIS — F329 Major depressive disorder, single episode, unspecified: Secondary | ICD-10-CM | POA: Insufficient documentation

## 2022-08-02 DIAGNOSIS — M5416 Radiculopathy, lumbar region: Secondary | ICD-10-CM

## 2022-08-02 DIAGNOSIS — C662 Malignant neoplasm of left ureter: Secondary | ICD-10-CM

## 2022-08-02 DIAGNOSIS — G8929 Other chronic pain: Secondary | ICD-10-CM | POA: Insufficient documentation

## 2022-08-02 DIAGNOSIS — M109 Gout, unspecified: Secondary | ICD-10-CM | POA: Insufficient documentation

## 2022-08-02 DIAGNOSIS — H3320 Serous retinal detachment, unspecified eye: Secondary | ICD-10-CM | POA: Insufficient documentation

## 2022-08-02 DIAGNOSIS — R338 Other retention of urine: Secondary | ICD-10-CM

## 2022-08-02 DIAGNOSIS — Z882 Allergy status to sulfonamides status: Secondary | ICD-10-CM | POA: Insufficient documentation

## 2022-08-02 DIAGNOSIS — M130 Polyarthritis, unspecified: Secondary | ICD-10-CM | POA: Insufficient documentation

## 2022-08-02 DIAGNOSIS — Z436 Encounter for attention to other artificial openings of urinary tract: Secondary | ICD-10-CM

## 2022-08-02 DIAGNOSIS — N2889 Other specified disorders of kidney and ureter: Secondary | ICD-10-CM

## 2022-08-02 DIAGNOSIS — I10 Essential (primary) hypertension: Secondary | ICD-10-CM | POA: Insufficient documentation

## 2022-08-02 DIAGNOSIS — Q620 Congenital hydronephrosis: Secondary | ICD-10-CM | POA: Insufficient documentation

## 2022-08-02 DIAGNOSIS — N289 Disorder of kidney and ureter, unspecified: Secondary | ICD-10-CM | POA: Insufficient documentation

## 2022-08-02 SURGERY — PAIN ESI CAUDAL WITH IMAGING
Anesthesia: Local

## 2022-08-02 SURGERY — IR CHNG NEPHROSTOMY TUBE
Anesthesia: Local

## 2022-08-02 MED ORDER — DEXAMETHASONE SOD PHOSPHATE PF 10 MG/ML IJ SOLN
INTRAMUSCULAR | Status: AC
Start: 2022-08-02 — End: 2022-08-02
  Filled 2022-08-02: qty 1

## 2022-08-02 MED ORDER — IOHEXOL 240 MG/ML IJ SOLN
INTRAMUSCULAR | Status: AC
Start: 2022-08-02 — End: 2022-08-02
  Filled 2022-08-02: qty 10

## 2022-08-02 MED ORDER — DEXAMETHASONE SODIUM PHOSPHATE 10 MG/ML IJ SOLN (CUSTOM)
INTRAMUSCULAR | Status: DC | PRN
Start: 2022-08-02 — End: 2022-08-02
  Administered 2022-08-02: 5 mg via EPIDURAL

## 2022-08-02 MED ORDER — LIDOCAINE HCL 1 % IJ SOLN
INTRAMUSCULAR | Status: DC | PRN
Start: 2022-08-02 — End: 2022-08-02
  Administered 2022-08-02: 5 mL

## 2022-08-02 MED ORDER — IODIXANOL 320 MG/ML IV SOLN
INTRAVENOUS | Status: DC | PRN
Start: 2022-08-02 — End: 2022-08-02
  Administered 2022-08-02: 5 mL

## 2022-08-02 MED ORDER — IOHEXOL 240 MG/ML IJ SOLN
INTRAMUSCULAR | Status: DC | PRN
Start: 2022-08-02 — End: 2022-08-02
  Administered 2022-08-02: .5 mL via EPIDURAL

## 2022-08-02 MED ORDER — IODIXANOL 320 MG/ML IV SOLN
INTRAVENOUS | Status: AC
Start: 2022-08-02 — End: 2022-08-02
  Filled 2022-08-02: qty 50

## 2022-08-02 MED ORDER — LIDOCAINE HCL 1 % IJ SOLN
INTRAMUSCULAR | Status: AC
Start: 2022-08-02 — End: 2022-08-02
  Filled 2022-08-02: qty 20

## 2022-08-02 SURGICAL SUPPLY — 8 items
APPLICATOR CHLORAPREP 3ML, CLEAR (Prep Solutions) ×2 IMPLANT
CANNULA NASAL FLARED W/7' TUBE (Misc Medical Supply) IMPLANT
EXTENSION SET M20 LEN 2ML (Tubing/Suction) IMPLANT
FILTERLINE ETC02  ~~LOC~~ (SHORT TERM) (Misc Medical Supply) IMPLANT
GLOVE BIOGEL PI ULTRATOUCH SIZE 7.5 (Gloves/Gowns) ×4 IMPLANT
KIT,IV START,EA (Patient Care Supply) IMPLANT
MARKER SECURELINE SURG SKIN (Misc Medical Supply) IMPLANT
TRAY SINGLE SHOT EPIDURAL (Procedure Packs/kits) ×2 IMPLANT

## 2022-08-02 SURGICAL SUPPLY — 17 items
APPLICATOR CHLORAPREP 26ML, ~~LOC~~ (Prep Solutions) ×1 IMPLANT
BAG DRAINAGE NEPHROSTOMY 600ML (Misc Medical Supply) ×1
CATHETER DRAIN MULTIPURPOSE MAC-LOC 8.5FR X 25CM (Lines/Drains) ×1 IMPLANT
COVER PROBE MICROTEK INTRAOPERATIVE 5" X 96" PC1308 (Drapes/towels)
DECANTER BAG-A-JET STERILE (Misc Medical Supply) ×1 IMPLANT
DRESSING SPONGE 2X2X4 PLY (Dressings/packing) ×1 IMPLANT
DRESSING SPONGE IV 2X2 STRL (Dressings/packing) ×1 IMPLANT
DRESSING TEGADERM HP 4X4 (Dressings/packing) ×1
GLOVE BIOGEL PI INDICATOR SIZE 7.5 (Gloves/Gowns) ×5 IMPLANT
GOWN SURGICAL ULTRA XL BLUE, AAMI LVL 3 (Gloves/Gowns) ×2 IMPLANT
GUIDEWIRE STARTER J-CURVED FIXED CORE 0.035" X 150CM, 8CM TAPER, 15MM J TIP (Procedural wires/sheaths/catheters/balloons/dilators) ×1 IMPLANT
PREP CHLOROPREP 10.5ML 1-STEP (Prep Solutions) IMPLANT
PROCEDURE PACK - IR NON-VASCULAR (Procedure Packs/kits) ×1 IMPLANT
SOLUTION IV 0.9% NS 500ML (Non-Pharmacy Meds/Solutions) ×1 IMPLANT
SUTURE ETHILON 2-0 18" FS (Suture) ×1
TOWELS OR BLUE 4-PACK STERILE, DISPOSABLE (Drapes/towels) IMPLANT
WIRE BENTSON (MERIT) .035 X 150CM (Procedural wires/sheaths/catheters/balloons/dilators) ×1 IMPLANT

## 2022-08-02 NOTE — H&P (Signed)
Ambulatory Surgery/Invasive Procedure History and Physical      Primary Care Physician Laurine Blazer    Chief Complaint:  low back and leg pain     71 year old male here for a caudal ESI for pain control.    BP 104/64   Temp 97.6 F (36.4 C)   Resp 14   SpO2 99%     Past Medical History:   Diagnosis Date    Chronic back pain     Congenital hydronephrosis     Gout     Headache     Hematuria     HTN (hypertension) 12/10/2021    Kidney disease     Kidney stones     Major depressive disorder, single episode     Polyarthropathy or polyarthritis of multiple sites     Retinal detachment     Urethral stricture        Allergies   Allergen Reactions    Sulfa Drugs Unspecified       No current facility-administered medications for this encounter.       I have reviewed the past medical history, allergies and current medications as documented in the electronic health record.      Physical Exam       Can this patient make their own healthcare decisions?  Yes    Chest:  Breaths easily     Heart:  RRR    Abdomen:  Soft    Pain Management Needs/Options discussed.    No Advanced Directives.      Resuscitative Status:  Full Code, Full Care    Diagnosis:    ICD-10-CM ICD-9-CM    1. Lumbar radiculopathy [M54.16 (ICD-10-CM)]  M54.16 724.4           Procedure: Caudal epidural steroid injection    This is the first injection of this type.     Discussed Risks, Benefits, and Alternatives to procedure.  Questions answered.  Patient voiced understanding and wished to proceed. Consent signed.    Consent signed with patient using ipad and electronic signature option.    Risk include:    Minor adverse effects: Superficial infection, bleeding, bruising, headache, worsening of pain, lack of benefit, flushing or redness of the face and neck, elevated blood pressure, increased blood sugar, difficulty sleeping, changes in mood, nausea, or vomiting. A transient sensation of tingling or shooting during the injection.    Rare but serious  adverse effects: Allergic reaction, internal bleeding, infection in deeper tissue such as the discs or spine requiring prolonged or intravenous antibiotics or surgery, punctured lungs, changes in blood pressure, pulse and respirations, cardiac arrest, permanented injury to nerves or spinal cord, paralysis, stroke and even death.     See procedure note of same date.

## 2022-08-02 NOTE — Discharge Instructions (Signed)
General       PAIN MANAGEMENT     You may use over-the-counter acetaminophen (TYLENOL) for minor discomfort, if you are not otherwise restricted from using this medication or any other prescribed pain medication by the doctor that performed your procedure.     WHEN TO CALL YOUR PHYSICIAN      Fever (temperature > 100 F) and / or chills    Severe pain / chest pain    Bleeding, redness / drainage or new swelling around the incision site    Shortness of breath    Nausea or vomiting     Tubes/drains/lines:     If you have a drain or tube in place, no swimming, baths, or soaking in hot tubs.     MEDICATIONS     Please resume taking your regular medications, unless otherwise told by your doctor.     PHYSICIAN PERFORMING YOUR PROCEDURE: Dr. Clabe Seal      NEPHROSTOMY TUBE EXCHANGE    Wound Care  ALWAYS [facility] YOUR HANDS BEFORE AND AFTER WOUND CARE  You may have some clear-to-light yellow drainage around the catheter insertion site for the first 1 to 3 days. This is normal  A tube from your kidney through the skin on your back helps the urine flow from your kidney into a bag. You may see some blood in the bag at first. This is normal and should clear over the next 24-72 hours.  Change the bandage in 24 hours looking for any redness, swelling or drainage-  You may use a cotton ball to clean around the site.  Once that initial bandage is changed, you need to change the dressing often enough to keep your skin clean and dry    Contact your primary care doctor or Interventional Radiology Department if  You have a temperature of 101 degrees and chills  You have pain in your back or side that will not go away or is getting worse  You have urine that smells bad or looks cloudy, or urine is leaking around the catheter  You notice unusual changes in the skin around the area of the catheter such as increased swelling, redness, bleeding, or soreness    Go to your nearest Emergency Room or call 9-1-1 if  You have a temperature over 102  degrees  You have uncontrollable pain, shortness of breath, shaking or chills    Medication  If you received sedation: The medications you received for the procedure are still in your system. Do not consume alcohol, operate heavy machinery, drive, or make important decisions for the rest of the day  You may resume taking your regular medications  If you have mild pain, you may take Acetaminophen   DO NOT take Ibuprofen (Motrin) or Naproxen (Aleve) for the next 24 hours     Activity  You may return to your normal activity the day after the procedure  No exercising or lifting heavy objects (nothing greater than 10 pounds) for the next 72 hours  Avoid any activity that causes a pulling sensation, pain around the catheter, or kinking in the catheter  You may take a shower tomorrow.  DO NOT soak in water      CONTACT us:     During regular business hours, M-F 8:00 to 5:00, please call [phone] [facility]) or [phone] [facility] / [facility])     After Hours: Please call the [place] Page Operator at [phone] and ask for the Interventional Radiologist On-Call.

## 2022-08-02 NOTE — Op Note (Signed)
Pro[facility] Note, [facility]    Preoperative Diagnosis: Lumbar radiculopathy    Postoperative Diagnosis: Lumbar radiculopathy    Procedure:  1. Caudal epidural steroid injection    2.   Fluoroscopy for needle guidance.  3.  Lumbar epidurography.     Surgeon/Staff:  Sunday Corn, MD                        Indications: The patient c/o  Axial Low Back Pain and Lower Extremity Pain. The patient's history and physical findings are consistent with Lumbar radiculopathy. They are here for an injection at the site thought to be the source of their pain.  This is the first injection performed for this patient..  Conservative measures have been tried and failed. The patient has severe pain causing significant functional disability and/or quality of life. This is the first ESI performed for this patient for the current pain. Patient will continue their ongoing home exercise program after ESI.     Procedure in detail:  Written informed consent was obtained.  The chart was reviewed, questions were answered and the patient wished to proceed. The patient had no contraindications to the procedure.  Vital signs were stable. Standard monitoring was applied. The patient, the procedure nurse, and the physician confirmed the site of injection after an official "time out." The skin over the injection site was also marked and confirmed as the site of the injection.    Localization Time Out:  An additional intraoperative timeout, specifically to confirm accurate localization, was conducted by the attending physician.     The patient remained awake and alert throughout the procedure.  The patient was placed in the prone position.  The skin was prepped with chlorhexidine and sterile drapes were applied.  A 27 gauge 1.25 inch needle needle was inserted percutaneously and advanced under fluoroscopic guidance using  AP, and lateral projections.  The needle tip entered via the sacral hiatus into the lumbosacral space. No  paraesthesias occurred and aspiration was negative   Epidurography and radiographic interpretation: Epidurography was performed by injection of Omnipaque 240 under live fluoroscopy.  Multiple Xray images were obtained.  Radiologic examination confirmed proper spread of the contrast medium within the epidural space.  There was no evidence of intrathecal or intravascular runoff.    The needle was injected with Dexamethasone ('10mg'$ /mL)  0.5 mL and Preservative free normal saline 2.5 mL.  The needle was removed.  A sterile bandage was applied.  The patient did not experience any hemodynamic or neurologic sequelae.      The patient was transferred to the recovery area. Post operative instructions were explained and given to the  patient. The patient was discharged in stable health

## 2022-08-02 NOTE — Discharge Instructions (Addendum)
Patient verbalized understanding of below instructions    Procedure Done Today: Initial-Caudal epidural steroid injection    Follow-up: Pain Clinic in 6-8 weeks with MD or PA    Post Procedure Instructions  Continue present medication unless otherwise indicated.  Resume your normal diet after being discharge.  Ice pack to treatment site (no more than 20 minutes at a time) if needed for pain relief and/or muscle spasm.  Avoid strenuous activities and driving until tomorrow morning.  If you have a band-aid dressing, you may remove it tomorrow morning.  Resume normal activities tomorrow morning, unless otherwise directed.  No submersion in water for 24 hrs.    If your next appointment is for a "[place]", please call [phone] to make an appointment.     If your next appointment is for a "PAIN PROCEDURE", please call [phone] to schedule an appointment.    If your next appointment is with the "[place]", please call [phone].     If another procedure was ordered:    For routine orders, please allow up to 10 business days for the authorization to be submitted to your insurance. Your insurance will process the request. Once your procedure is authorized you will then be contacted by our team to be scheduled. If you have not heard from [name] after 10 business days, please, call the Pain Clinic to check on your authorization status [id number].    [facility], [address] [place], [place], [address]     Please log on to [place] [email or web address] before every clinic visit to complete your electronic questionnaire. This will help our team to provide you with the best personalized treatments and will help [name] to get your treatments approved by your insurance.    Patient Charity fundraiser Programmer, applications) [phone]  Covered [place] Option Hotline [phone]  Medical Records [phone]       Hours of Operation for Medical Records:  [facility] Monday -  Friday 730am-4pm  Radiology Imaging Scheduling [place] (Contracting) [phone]  website:  [email or web address]    Call the [facility] at [phone] and ask for the Pain Management Fellow on-call for ANY signs of:  Temperature above 101.5 F  Redness or drainage at the treatment site  Severe, uncontrollable headache    Call 911 IN CASE OF EMERGENCY

## 2022-08-08 ENCOUNTER — Ambulatory Visit: Payer: Medicare Other | Attending: Urology

## 2022-08-08 VITALS — BP 121/64 | HR 63 | Temp 98.6°F | Resp 16 | Ht 67.0 in | Wt 187.7 lb

## 2022-08-08 DIAGNOSIS — C642 Malignant neoplasm of left kidney, except renal pelvis: Secondary | ICD-10-CM | POA: Insufficient documentation

## 2022-08-08 MED ORDER — SODIUM CHLORIDE (PF) 0.9 % IJ SOLN
17.00 mg | Freq: Once | INTRAMUSCULAR | Status: AC
Start: 2022-08-08 — End: 2022-08-08
  Administered 2022-08-08: 17 mg via INTRAVESICAL
  Filled 2022-08-08: qty 17

## 2022-08-08 MED ORDER — LIDOCAINE HCL 2% EX GEL (UROJET)
10.00 mL | Freq: Once | Status: DC | PRN
Start: 2022-08-08 — End: 2022-08-08

## 2022-08-08 NOTE — Patient Instructions (Signed)
BCG Treatment for Bladder Cancer    WHAT IS BCG?  Bacillus Calmette-Guerin (BCG) is a type of bacterium used in bladder cancer to prevent the cancer from coming back after surgery. It is placed into the bladder through a catheter (tube). BCG is also given as a vaccine for tuberculosis (TB) in some countries.    HOW DOES BCG WORK?  When given in the bladder, BCG causes an immune response that brings disease- fighting cells to the bladder. Your immune system can then fight against the cancer cells that are found in the lining of the bladder.    WHAT IS THE USUAL COURSE OF TREATMENT?  Once a week for 6 weeks or once a week for 3 weeks or monthly, as directed by your physician. After your last treatment, you will be scheduled for a cystoscopy in 4-6 weeks.    WHAT DO I NEED TO TELL MY PROVIDER BEFORE I RECEIVE BCG?  If you have ever had an bad reaction to BCG  If you have any of these health problems:  Active TB  Any cancer beside bladder cancer  HIV/AIDS  Weak immune system  Any infection or fever, or if you are taking antibiotics  Blood in the urine  Problems with urinary retention or incontinence  If you are receiving cancer therapy (for example, chemotherapy, radiation) or medications that suppress your immune system (for example, steroids).  If you are taking a diuretic (water pill).  If you had a biopsy, transuretheral resection of bladder tumor (TURBT), or bladder catheterization that caused injury or bleeding within the past two weeks.  If you are pregnant or may become pregnant during treatment. Do not become pregnant while on therapy.  If you are breastfeeding or plan to breastfeed during treatment. We recommend stopping breastfeeding while on BCG.    BEFORE BCG TREATMENT  Do not drink caffeine or alcohol on treatment days.  Purchase household bleach. You will need it at home after treatment to prevent BCG from entering the water system.  Do not drink fluids for four (4) hours before treatment.  Please  bring an absorbent pad or underwear if you normally experience leaks.    WHAT CAN I EXPECT DURING BCG TREATMENT?  BCG will be given in a treatment room at the Urology Clinic or infusion center.  Do not drink fluids for the two (2) hours while BCG is in your bladder.  If you feel any discomfort during treatment, notify your nurse immediately.  Positioning: The bladder is an expandable sac-like organ that contracts when it is empty. The inner lining of the bladder tucks in the folds and expands out to accommodate liquid. For this reason, no turning or lying is necessary. You may position yourself in whatever position you prefer while at home.  At each visit for BCG treatment, you will be asked for a urine sample. If you have signs or symptoms of a bladder infection or blood in the urine, you will not receive BCG that day.  If you are having treatment you will be asked to undress from the waist down and given a sheet to cover yourself.  The nurse will ask you to lie on your back on the bed; they will clean your genital area.  A catheter will be placed into your urethra (tube that carries urine out of your body). This catheter will let any urine drain from your bladder. Once the urine is drained, BCG will be placed into your bladder through the same catheter.    Lidocaine gel, a numbing medication, may be used to make the catheter placement more comfortable.  After BCG is given, the catheter is removed and you will be allowed to go home.  You will hold the BCG solution in your bladder for two (2) hours; you may urinate (pee) sooner if absolutely necessary.  After 2 hours, urinate in your home toilet.  Both men and women should sit on the toilet to urinate to prevent splashing of urine.  Do not flush the toilet! Disinfect urine for 15 minutes with two (2) cups of household bleach before flushing.  Wash your hands thoroughly with warm water and soap.    Follow these same instructions every time you urinate for the first six  (6) hours, and plan to stay home and use the same toilet for those six (6) hours.    AFTER BCG TREATMENT  You may return to your normal activities and social contact. You do not need to restrict your movements and regular routine. You can eat and drink as you normally do.  Drink plenty of fluids after treatment to flush BCG out of your bladder.  If urine contacts your skin, thoroughly wash the area with soap and water.  If any clothes or linens have urine on them, wash separately in a washing machine on the hot cycle with bleach.  If urine splashes or spills on the floor, toilet seat, or other areas, cover the area with bleach-soaked paper towels for fifteen (15) minutes, then clean with soap and water.  If you are sexually active, you or your partner must wear a condom during the treatment course and for one (1) week after treatment has ended.    SIDE EFFECTS  You may feel painful or difficult urination, burning, urgency, frequency or see blood in urine (hematuria).  Flu-like symptoms: Fever, malaise, chills, fatigue, generalized aches and pains    Most patients have mild symptoms of bladder irritation. You may also have flu-like symptoms. Symptoms usually begin 4 to 6 hours after BCG is given and last 2 to 3 days after treatment. Side effects may be worse each time you have a BCG treatment.    The following symptoms may be signs of a rare, but serious systemic BCG infection. Contact your provider or nurse Urology Clinic ([phone]), on-call urologist ([phone]) during weekends, nights, and holidays,  or go to the emergency room immediately if you experience any of the following symptoms:  Fever higher than 103?F anytime OR Fever higher than 101.3?F that lasts for more than 48 hours  Increase in urinary symptoms and flu-like symptoms lasting more than 48 hours  Signs of liver problems like dark urine, feeling tired, not hungry, upset stomach or stomach pain, light-colored stools, throwing up, or yellow skin  or eyes  Joint pain, skin rash, cough  Extreme fatigue (unable to take care of yourself)    Call 911 immediately if you experience any of the following symptoms:  Shortness of breath  Confusion  Dizziness or lightheadedness

## 2022-08-08 NOTE — Interdisciplinary (Signed)
Patient arrived ambulatory, alert and without signs or symptoms of distress     Patient presents for 4th dose of BCG. Patient voided 60 cc urine, no gross hematuria noted.  Patient denies s/s of UTI, dysuria/frequency in urination/flank/suprapubic pain. VSS.      Procedure was explained by nurse and understood by patient.      Area prepped with alcohol swab. BCG 17 mg in 15 ml normal saline instilled into nephrostomy tube. BCG drained after 2 hours.     Procedure well tolerated by the patient.     Information/teaching given to patient including s/s of infection/bleeding/symptom control and when to notify MD, patient verbalized understanding.      Patient discharged to home in stable condition.

## 2022-08-15 ENCOUNTER — Ambulatory Visit: Payer: Medicare Other | Attending: Urology

## 2022-08-15 VITALS — BP 112/68 | HR 59 | Temp 97.7°F | Resp 16 | Ht 67.0 in | Wt 188.0 lb

## 2022-08-15 DIAGNOSIS — C642 Malignant neoplasm of left kidney, except renal pelvis: Secondary | ICD-10-CM | POA: Insufficient documentation

## 2022-08-15 MED ORDER — LIDOCAINE HCL 2% EX GEL (UROJET)
10.0000 mL | Freq: Once | Status: DC | PRN
Start: 2022-08-15 — End: 2022-08-15

## 2022-08-15 MED ORDER — SODIUM CHLORIDE (PF) 0.9 % IJ SOLN
17.0000 mg | Freq: Once | INTRAVESICAL | Status: AC
Start: 2022-08-15 — End: 2022-08-15
  Administered 2022-08-15: 17 mg via INTRAVESICAL
  Filled 2022-08-15: qty 17

## 2022-08-15 NOTE — Interdisciplinary (Signed)
Patient arrived ambulatory, alert and without signs or symptoms of distress     Patient presents for 5th dose of BCG. Patient voided 60 cc urine, no gross hematuria noted.  Patient denies s/s of UTI, dysuria/frequency in urination/flank/suprapubic pain. VSS.      Procedure was explained by nurse and understood by patient.       BCG 17 mg in 42m normal saline instilled into nephrostomy tube. BCG drained from nephrostomy tube after 2 hours.      Procedure well tolerated by the patient.     Information/teaching given to patient including s/s of infection/bleeding/symptom control and when to notify MD, patient verbalized understanding.      Patient discharged to home in stable condition.

## 2022-08-15 NOTE — Patient Instructions (Signed)
BCG Treatment for Bladder Cancer    WHAT IS BCG?  Bacillus Calmette-Guerin (BCG) is a type of bacterium used in bladder cancer to prevent the cancer from coming back after surgery. It is placed into the bladder through a catheter (tube). BCG is also given as a vaccine for tuberculosis (TB) in some countries.    HOW DOES BCG WORK?  When given in the bladder, BCG causes an immune response that brings disease- fighting cells to the bladder. Your immune system can then fight against the cancer cells that are found in the lining of the bladder.    WHAT IS THE USUAL COURSE OF TREATMENT?  Once a week for 6 weeks or once a week for 3 weeks or monthly, as directed by your physician. After your last treatment, you will be scheduled for a cystoscopy in 4-6 weeks.    WHAT DO I NEED TO TELL MY PROVIDER BEFORE I RECEIVE BCG?  If you have ever had an bad reaction to BCG  If you have any of these health problems:  Active TB  Any cancer beside bladder cancer  HIV/AIDS  Weak immune system  Any infection or fever, or if you are taking antibiotics  Blood in the urine  Problems with urinary retention or incontinence  If you are receiving cancer therapy (for example, chemotherapy, radiation) or medications that suppress your immune system (for example, steroids).  If you are taking a diuretic (water pill).  If you had a biopsy, transuretheral resection of bladder tumor (TURBT), or bladder catheterization that caused injury or bleeding within the past two weeks.  If you are pregnant or may become pregnant during treatment. Do not become pregnant while on therapy.  If you are breastfeeding or plan to breastfeed during treatment. We recommend stopping breastfeeding while on BCG.    BEFORE BCG TREATMENT  Do not drink caffeine or alcohol on treatment days.  Purchase household bleach. You will need it at home after treatment to prevent BCG from entering the water system.  Do not drink fluids for four (4) hours before treatment.  Please  bring an absorbent pad or underwear if you normally experience leaks.    WHAT CAN I EXPECT DURING BCG TREATMENT?  BCG will be given in a treatment room at the Urology Clinic or infusion center.  Do not drink fluids for the two (2) hours while BCG is in your bladder.  If you feel any discomfort during treatment, notify your nurse immediately.  Positioning: The bladder is an expandable sac-like organ that contracts when it is empty. The inner lining of the bladder tucks in the folds and expands out to accommodate liquid. For this reason, no turning or lying is necessary. You may position yourself in whatever position you prefer while at home.  At each visit for BCG treatment, you will be asked for a urine sample. If you have signs or symptoms of a bladder infection or blood in the urine, you will not receive BCG that day.  If you are having treatment you will be asked to undress from the waist down and given a sheet to cover yourself.  The nurse will ask you to lie on your back on the bed; they will clean your genital area.  A catheter will be placed into your urethra (tube that carries urine out of your body). This catheter will let any urine drain from your bladder. Once the urine is drained, BCG will be placed into your bladder through the same catheter.    Lidocaine gel, a numbing medication, may be used to make the catheter placement more comfortable.  After BCG is given, the catheter is removed and you will be allowed to go home.  You will hold the BCG solution in your bladder for two (2) hours; you may urinate (pee) sooner if absolutely necessary.  After 2 hours, urinate in your home toilet.  Both men and women should sit on the toilet to urinate to prevent splashing of urine.  Do not flush the toilet! Disinfect urine for 15 minutes with two (2) cups of household bleach before flushing.  Wash your hands thoroughly with warm water and soap.    Follow these same instructions every time you urinate for the first six  (6) hours, and plan to stay home and use the same toilet for those six (6) hours.    AFTER BCG TREATMENT  You may return to your normal activities and social contact. You do not need to restrict your movements and regular routine. You can eat and drink as you normally do.  Drink plenty of fluids after treatment to flush BCG out of your bladder.  If urine contacts your skin, thoroughly wash the area with soap and water.  If any clothes or linens have urine on them, wash separately in a washing machine on the hot cycle with bleach.  If urine splashes or spills on the floor, toilet seat, or other areas, cover the area with bleach-soaked paper towels for fifteen (15) minutes, then clean with soap and water.  If you are sexually active, you or your partner must wear a condom during the treatment course and for one (1) week after treatment has ended.    SIDE EFFECTS  You may feel painful or difficult urination, burning, urgency, frequency or see blood in urine (hematuria).  Flu-like symptoms: Fever, malaise, chills, fatigue, generalized aches and pains    Most patients have mild symptoms of bladder irritation. You may also have flu-like symptoms. Symptoms usually begin 4 to 6 hours after BCG is given and last 2 to 3 days after treatment. Side effects may be worse each time you have a BCG treatment.    The following symptoms may be signs of a rare, but serious systemic BCG infection. Contact your provider or nurse Urology Clinic ([phone]), on-call urologist ([phone]) during weekends, nights, and holidays,  or go to the emergency room immediately if you experience any of the following symptoms:  Fever higher than 103?F anytime OR Fever higher than 101.3?F that lasts for more than 48 hours  Increase in urinary symptoms and flu-like symptoms lasting more than 48 hours  Signs of liver problems like dark urine, feeling tired, not hungry, upset stomach or stomach pain, light-colored stools, throwing up, or yellow skin  or eyes  Joint pain, skin rash, cough  Extreme fatigue (unable to take care of yourself)    Call 911 immediately if you experience any of the following symptoms:  Shortness of breath  Confusion  Dizziness or lightheadedness

## 2022-08-18 ENCOUNTER — Encounter (HOSPITAL_BASED_OUTPATIENT_CLINIC_OR_DEPARTMENT_OTHER): Payer: Self-pay | Admitting: Urology

## 2022-08-18 DIAGNOSIS — C642 Malignant neoplasm of left kidney, except renal pelvis: Secondary | ICD-10-CM

## 2022-08-18 DIAGNOSIS — R82998 Other abnormal findings in urine: Secondary | ICD-10-CM

## 2022-08-19 ENCOUNTER — Telehealth (HOSPITAL_BASED_OUTPATIENT_CLINIC_OR_DEPARTMENT_OTHER): Payer: Self-pay

## 2022-08-19 NOTE — Telephone Encounter (Signed)
Stephen [name] / Medical Oncology Social Work Telephone note:    Reason for referral:  Incoming call from [place].  He will have his 6th BCG infusion on 9/5.  He asked that I come by to provide ongoing supportive intervention.  I told him I will plan on seeing him in clinic that day.    Stephen [name] updated me that he continues to work on housing; he has what sounds like a housing advocate at [employer] [email or web address].  Stephen [name] is hopeful that he can find something, or at a minimum, get on the Section 8 Housing list.    Stephen [name] also has questions about the new [place] app "MyPathApp" that he can download on his phone.      Impression/Plan:  Overall, Stephen [name] expresses a good understanding of his treatment plan. He expresses gratefulness for the support and care of Doretha Imus, RN, who provides his BCG infusions.    Stephen [name] is looking forward to finishing up treatment with Dr. Sallyanne Kuster and seems hopeful for his future.    SW to follow.    [name], [place] Social Worker  [phone]

## 2022-08-23 ENCOUNTER — Other Ambulatory Visit (HOSPITAL_BASED_OUTPATIENT_CLINIC_OR_DEPARTMENT_OTHER): Payer: Self-pay | Admitting: Nurse Practitioner

## 2022-08-23 ENCOUNTER — Ambulatory Visit: Payer: Medicare Other | Attending: Urology

## 2022-08-23 ENCOUNTER — Encounter (HOSPITAL_BASED_OUTPATIENT_CLINIC_OR_DEPARTMENT_OTHER): Payer: Self-pay

## 2022-08-23 ENCOUNTER — Telehealth (HOSPITAL_BASED_OUTPATIENT_CLINIC_OR_DEPARTMENT_OTHER): Payer: Self-pay

## 2022-08-23 VITALS — BP 128/74 | HR 56 | Temp 97.6°F | Resp 16 | Ht 67.0 in | Wt 188.0 lb

## 2022-08-23 DIAGNOSIS — Z981 Arthrodesis status: Secondary | ICD-10-CM

## 2022-08-23 DIAGNOSIS — C642 Malignant neoplasm of left kidney, except renal pelvis: Secondary | ICD-10-CM | POA: Insufficient documentation

## 2022-08-23 MED ORDER — LIDOCAINE HCL 2% EX GEL (UROJET)
10.0000 mL | Freq: Once | Status: DC | PRN
Start: 2022-08-23 — End: 2022-08-23

## 2022-08-23 MED ORDER — SODIUM CHLORIDE (PF) 0.9 % IJ SOLN
17.0000 mg | Freq: Once | INTRAVESICAL | Status: AC
Start: 2022-08-23 — End: 2022-08-23
  Administered 2022-08-23: 17 mg via INTRAVESICAL
  Filled 2022-08-23: qty 17

## 2022-08-23 NOTE — Telephone Encounter (Signed)
Please contact patient to schedule OR cystoscopy with ureteroscopy in 6 weeks.  Patient completed BCG today.

## 2022-08-23 NOTE — Patient Instructions (Signed)
BCG Treatment for Bladder Cancer    WHAT IS BCG?  Bacillus Calmette-Guerin (BCG) is a type of bacterium used in bladder cancer to prevent the cancer from coming back after surgery. It is placed into the bladder through a catheter (tube). BCG is also given as a vaccine for tuberculosis (TB) in some countries.    HOW DOES BCG WORK?  When given in the bladder, BCG causes an immune response that brings disease- fighting cells to the bladder. Your immune system can then fight against the cancer cells that are found in the lining of the bladder.    WHAT IS THE USUAL COURSE OF TREATMENT?  Once a week for 6 weeks or once a week for 3 weeks or monthly, as directed by your physician. After your last treatment, you will be scheduled for a cystoscopy in 4-6 weeks.    WHAT DO I NEED TO TELL MY PROVIDER BEFORE I RECEIVE BCG?  If you have ever had an bad reaction to BCG  If you have any of these health problems:  Active TB  Any cancer beside bladder cancer  HIV/AIDS  Weak immune system  Any infection or fever, or if you are taking antibiotics  Blood in the urine  Problems with urinary retention or incontinence  If you are receiving cancer therapy (for example, chemotherapy, radiation) or medications that suppress your immune system (for example, steroids).  If you are taking a diuretic (water pill).  If you had a biopsy, transuretheral resection of bladder tumor (TURBT), or bladder catheterization that caused injury or bleeding within the past two weeks.  If you are pregnant or may become pregnant during treatment. Do not become pregnant while on therapy.  If you are breastfeeding or plan to breastfeed during treatment. We recommend stopping breastfeeding while on BCG.    BEFORE BCG TREATMENT  Do not drink caffeine or alcohol on treatment days.  Purchase household bleach. You will need it at home after treatment to prevent BCG from entering the water system.  Do not drink fluids for four (4) hours before treatment.  Please  bring an absorbent pad or underwear if you normally experience leaks.    WHAT CAN I EXPECT DURING BCG TREATMENT?  BCG will be given in a treatment room at the Urology Clinic or infusion center.  Do not drink fluids for the two (2) hours while BCG is in your bladder.  If you feel any discomfort during treatment, notify your nurse immediately.  Positioning: The bladder is an expandable sac-like organ that contracts when it is empty. The inner lining of the bladder tucks in the folds and expands out to accommodate liquid. For this reason, no turning or lying is necessary. You may position yourself in whatever position you prefer while at home.  At each visit for BCG treatment, you will be asked for a urine sample. If you have signs or symptoms of a bladder infection or blood in the urine, you will not receive BCG that day.  If you are having treatment you will be asked to undress from the waist down and given a sheet to cover yourself.  The nurse will ask you to lie on your back on the bed; they will clean your genital area.  A catheter will be placed into your urethra (tube that carries urine out of your body). This catheter will let any urine drain from your bladder. Once the urine is drained, BCG will be placed into your bladder through the same catheter.    Lidocaine gel, a numbing medication, may be used to make the catheter placement more comfortable.  After BCG is given, the catheter is removed and you will be allowed to go home.  You will hold the BCG solution in your bladder for two (2) hours; you may urinate (pee) sooner if absolutely necessary.  After 2 hours, urinate in your home toilet.  Both men and women should sit on the toilet to urinate to prevent splashing of urine.  Do not flush the toilet! Disinfect urine for 15 minutes with two (2) cups of household bleach before flushing.  Wash your hands thoroughly with warm water and soap.    Follow these same instructions every time you urinate for the first six  (6) hours, and plan to stay home and use the same toilet for those six (6) hours.    AFTER BCG TREATMENT  You may return to your normal activities and social contact. You do not need to restrict your movements and regular routine. You can eat and drink as you normally do.  Drink plenty of fluids after treatment to flush BCG out of your bladder.  If urine contacts your skin, thoroughly wash the area with soap and water.  If any clothes or linens have urine on them, wash separately in a washing machine on the hot cycle with bleach.  If urine splashes or spills on the floor, toilet seat, or other areas, cover the area with bleach-soaked paper towels for fifteen (15) minutes, then clean with soap and water.  If you are sexually active, you or your partner must wear a condom during the treatment course and for one (1) week after treatment has ended.    SIDE EFFECTS  You may feel painful or difficult urination, burning, urgency, frequency or see blood in urine (hematuria).  Flu-like symptoms: Fever, malaise, chills, fatigue, generalized aches and pains    Most patients have mild symptoms of bladder irritation. You may also have flu-like symptoms. Symptoms usually begin 4 to 6 hours after BCG is given and last 2 to 3 days after treatment. Side effects may be worse each time you have a BCG treatment.    The following symptoms may be signs of a rare, but serious systemic BCG infection. Contact your provider or nurse Urology Clinic ([phone]), on-call urologist ([phone]) during weekends, nights, and holidays,  or go to the emergency room immediately if you experience any of the following symptoms:  Fever higher than 103?F anytime OR Fever higher than 101.3?F that lasts for more than 48 hours  Increase in urinary symptoms and flu-like symptoms lasting more than 48 hours  Signs of liver problems like dark urine, feeling tired, not hungry, upset stomach or stomach pain, light-colored stools, throwing up, or yellow skin  or eyes  Joint pain, skin rash, cough  Extreme fatigue (unable to take care of yourself)    Call 911 immediately if you experience any of the following symptoms:  Shortness of breath  Confusion  Dizziness or lightheadedness

## 2022-08-23 NOTE — Interdisciplinary (Signed)
Patient arrived ambulatory, alert and without signs or symptoms of distress     Patient presents for 6th dose of BCG. Patient voided 50 cc urine, no gross hematuria noted.  Patient denies s/s of UTI, dysuria/frequency in urination/flank/suprapubic pain. VSS.      Procedure was explained by nurse and understood by patient.      BCG 25 mg in 50 ml normal saline instilled into nephrostomy tube and retained for 2 hours. Nephrostomy tube drained after 2 hours and BCG was disposed of per protocol.       Procedure well tolerated by the patient.     Information/teaching given to patient including s/s of infection/bleeding/symptom control and when to notify MD, patient verbalized understanding.      Patient discharged to home in stable condition.

## 2022-08-24 MED ORDER — PREGABALIN 100 MG OR CAPS
100.00 mg | ORAL_CAPSULE | Freq: Every evening | ORAL | 2 refills | Status: DC
Start: 2022-08-24 — End: 2022-09-21

## 2022-08-24 NOTE — Telephone Encounter (Signed)
Lyrica refilled

## 2022-08-26 ENCOUNTER — Encounter (HOSPITAL_BASED_OUTPATIENT_CLINIC_OR_DEPARTMENT_OTHER): Payer: Self-pay | Admitting: Hospital

## 2022-08-26 NOTE — Telephone Encounter (Signed)
pt is calling to schedule surgery with Dr Sallyanne Kuster    Phone # [phone]  Did you attempt to reach the coordinator via phone? y  Outcome of attempt no answer    Caller has been advised of 48-72 hr turnaround time.

## 2022-08-26 NOTE — Telephone Encounter (Signed)
Routing to Anne-Marie to assist.

## 2022-08-26 NOTE — Progress Notes (Signed)
[place] / Medical Oncology Social Work Note    Met with Stephen [name] in the Urology clinic waiting room, during his 2 hour waiting period after infusion.  He presented as open and talkative.  He voiced a good understanding of his treatment plan and positive attitude about his health status and care.  Stephen [name] asked about the new pt support app through MyChart and downloaded it on his phone while we met.    I provided supportive counseling and validation of feelings.  Focused on client's strengths and efforts to take care of himself.  Reviewed coping with current family stressors (doesn't like living with bro-in-law) and briefly offered solutions.  Stephen [name] said "it's ok for now..." and he has developed ways of effectively dealing with the tension at home.  He still hopes to find some housing options through case manager at [employer].  Will be meeting with CM in a couple days.    While he has experienced many psychosocial stressors, Stephen [name] appears resilient and a good problem solver and good self advocate.    Stephen [name] knows he has an upcoming surgery with Dr. Sallyanne Kuster and is ready to schedule that.    Stephen [name] will stay in touch by phone. SW to follow as needed.     [name], [place] Social Worker  [phone]

## 2022-08-26 NOTE — Telephone Encounter (Signed)
SURGERY:  Left ureteroscopy, possible biopsy, possible laser fulguration of lesion, possible cytology washings, possible insertion of ureteral stent     Surgeon: Dr. Sallyanne Kuster    Received surgical order in Epic from Dr. Sallyanne Kuster      I contacted patient at [phone] to discuss surgery scheduling, there's no answer.     I left a detailed message and provided patient with my direct number to return my call to coordinate.    1st attempt to contact pt, sent MyChart msg as well

## 2022-08-29 NOTE — Telephone Encounter (Signed)
Left msg for pt - advised looking at scheduling surgery on 10/16 at [facility] since MD request to schedule 6 weeks after last BCG treatment which was on 09/05.    Request call back or reply to the Mychart msg I will be sending him.

## 2022-08-29 NOTE — Telephone Encounter (Signed)
pt is calling to schedule  surgery with Dr Sallyanne Kuster     Phone # phone on file   Did you attempt to reach the coordinator via phone? Yes   Outcome of attempt yes     Caller has been advised of 48-96 hr turnaround time.

## 2022-09-01 ENCOUNTER — Encounter: Payer: Self-pay | Admitting: Family Medicine

## 2022-09-02 ENCOUNTER — Telehealth (HOSPITAL_BASED_OUTPATIENT_CLINIC_OR_DEPARTMENT_OTHER): Payer: Self-pay | Admitting: Urology

## 2022-09-02 NOTE — Telephone Encounter (Signed)
Left msg for pt - advised surgery scheduled at at [facility] on 1012 with check in time at 5:20 AM    Reviewed pre op instructions with patient - NPO after midnight    Reviewed Transportation with family/friend after discharge   - Please arrange for transportation home after your surgery and hospital stay. A taxi, shuttle bus, uber or lyft is NOT acceptable! You will need to be accompanied home by a responsible adult.  - You must have a friend or relative accompany you on the day of surgery.  This person will be asked to review discharge instructions with the nurse and to take you home. If you do not bring someone, your surgery may be cancelled.   - You CANNOT drive yourself home or use public transportation. You CANNOT use a ride-share service (Uber/Lyft) or a taxi unless you have your friend or relative accompany you    Advised I will send information via MyChart and contact me with any questions.

## 2022-09-02 NOTE — Telephone Encounter (Signed)
Cristin Szatkowski            11/15/1951                [id number]    APC Triage Tool Visit Result: Video  Translation Needed: no  DOS: 10/12  Surgeon: Dr. Sallyanne Kuster  Status (Inpatient, Outpatient, Obs): Outpatient      Patient needs APC appointment 2-5 weeks from DOS. No appointments available.

## 2022-09-02 NOTE — Telephone Encounter (Signed)
.  Thank you for reaching out regarding your patient's APC appointment request. An APC RN phone call has been made. Patient is aware of appt date and time. The scheduled appointment information is in Epic.     Thank you,  APC Team Members

## 2022-09-07 NOTE — Telephone Encounter (Signed)
Received surgery order from Dr. Sallyanne Kuster for: Left ureteroscopy, possible biopsy, possible laser fulguration of lesion, possible cytology washings, possible insertion of ureteral stent       Spoke to pt and confirmed plan. Pt uses MyChart, will also send MyChart message with plan listed below:    Outpatient surgery plan confirmed for September 29, 2022 for Left ureteroscopy, possible biopsy, possible laser fulguration of lesion, possible cytology washings, possible insertion of ureteral stent      Surgery Date:  September 29, 2022  Tentative Check in time: 5:20 AM  Tentative Surgery time: 7:20 AM  [Patient aware that check in time is subject to change. Patient will receive confirmation call one business day prior from the operating room team to finalize surgery check in time.]  Location: [facility] located at [address] [place],  [place], [place] [address].     Post-Op Destination/Status: Outpatient  Patient aware to have a ride available upon discharge.  Uber/Lyft/Taxi not permitted.      Please arrange for transportation home after your surgery and hospital stay. A taxi, shuttle bus, uber or lyft is NOT acceptable! You will need to be accompanied home by a responsible adult.    You must have a friend or relative accompany you on the day of surgery.  This person will be asked to review discharge instructions with the nurse and to take you home. If you do not bring someone, your surgery may be cancelled.     You CANNOT drive yourself home or use public transportation. You CANNOT use a ride-share service (Uber/Lyft) or a taxi unless you have your friend or relative accompany you.    [facility] ([facility]) Appointment: September 15, 2022  (Patient informed they will be contacted with APC appt once scheduled)    1-2 Weeks Post Op w/ MD: October 11, 2022 at 2:00 PM       Surgical Consent:      Is patient aware of bowel prep?: Yes  - DO NOT eat or drink anything after midnight on  the night before your surgery.    Is patient on ASA/ Blood thinners? No  (If yes, confirm with clinical team that pt may stop 7 days prior to surgery)     Are you under the care of a cardiologist? No  (If yes, need name and inform pt they will need clearance)    Is Medical Clearance needed? No  (If clinical team not positive if clearance is needed, pt to see APC to confirm and should be 2 weeks prior to surgery date)     Are pre op labs & urine culture completed within 30 days of surgery date?: No - Patient aware to complete Labs 10-14 days prior to surgery. Lab order already in Epic for patient to complete.     Is patient diagnosed w/sleep apnea?: No  (If yes, patient to discuss with APC team if they need to bring CPAP machine day of surgery)     Can you walk up two flight of stairs and two blocks with out difficulty?: Yes     Has the patient been hospitalized with in the last 3 months?: No     Does the patient have a history of a difficult airway or other anesthesia problems?: No     Does pt have mychart?: Yes  (If yes, inform patient surgery letter to be sent via MyChart unless patient requests email or mail)  PRE-OPERATIVE NPO BOWEL PREPARATION    As soon as you wake up on the [place], please have a bowel movement if possible before you leave for the hospital.     Take your regular medications on the [place] (except Aspirin, Coumadin or Plavix).  If you regularly take any medication in the morning, especially insulin or other oral medication for diabetes you must discuss this with your doctor.  Please make sure that your doctor approves all the medications that you are taking.    DO NOT eat or drink anything after midnight on the night before your surgery.    REMEMBER: You cannot take aspirin or other similar medications (Motrin, Ibuprofen, Naprosen) or blood thinners (coumadin, plavix) for 1 week prior to this procedure, unless otherwise advised by your Physician.    If you have any  questions about this Bowel Preparation, please call  [facility] Nurse Case Manager      ASPIRIN/IBUPROFEN/BLOOD THINNING AGENTS    For 7 days prior to surgery, do not take any medication containing aspirin or ibuprofen, unless otherwise advised by your Physician. Please refer to the list below for some of the medications that contain aspirin and/or ibuprofen.  If it is necessary for you to take any medication, please use Tylenol or acetaminophen substitutes.      Advil    Gingkoba  Alka-Seltzer   Liquiprin  Alleve    Measurin  Anacin    Midol  APC    Motrin  ASA compound  Norgesic  Ascriptin   Novahistine with APC  Aspergum   Nuprin  Bufferin   PAC  CAMA    Percodan  Capron capsules   Phenaphen  Contact   Phensol  Cope    Relafen  Coricidin   Rivaroxaban (Xeralto)  Counterpain   Robaxisal  Dabigatran (prodaxa)  Sal-Fayne  Daprisal   Stanback  Darvon compound  Super Anahist  Dolene compound  Synalogos  Dristan    Talwin compound  Ecotrin    Trigesic  Edrisal    Triphen  Eliquis (Apixaban)  Trilisate  Equagesic   Triaminic  Excedrin   Vanquish  Femcaps   Vitamin E (or any other multi-vitamin)  Fiorinal   Warfrain (Coumadin, Jantoven)  Plavix    Zactirin    This list does not include every medication that contains aspirin or ibuprofen.  Before taking any medication prior to or after surgery, please read the label carefully for the active ingredients aspirin, salicylates, and/or ibuprofen.  If the medication contains these ingredients do not use.  Please inform your physician of all medications you are taking, including non-prescription medications.      PRE-OPERATIVE INSTRUCTIONS:     If there is any change in your surgery time, you will be contacted by the operating room scheduling team after 5:00 p.m. the day before your surgery     For 7 days prior to surgery, please do not take any medications that contain ibuprofen, or any non-steroidal anti-inflammatory medications. If you must take pain medication, please  take Tylenol or an acetaminophen substitute.  If you take prescription medications, check with the Anesthesiologist or the nurse in the Preoperative [place] about whether you should take your medications on the day of surgery, especially blood pressure and heart medications.  If you smoke, do not smoke for at least 24 hours prior to surgery. Please be aware that [facility] is a non-smoking facility.  Wear comfortable, loose clothing to the hospital.  Leave all valuables at home. This includes jewelry, credit cards, money (except for copayment).  ALL jewelry must be removed, including rings, earrings, necklaces, navel rings, etc.  If you wear contact lenses or glasses, bring a case for them. Contact lenses must be removed prior to surgery.  Bring a picture ID and your insurance card, and be prepared to pay your deductible or co-insurance by cash, check, or credit card when you arrive.   Please arrange for transportation home after your surgery and hospital stay. A taxi, shuttle bus, uber or lyft is NOT acceptable! You will need to be accompanied home by a responsible adult. You may want to have a pillow and/or blanket in the car for the ride home.  If you will be staying in the hospital, bring a robe, slippers and any items for grooming that you may need during your stay.  Please call your doctor if you have any questions regarding your surgery or if you develop any signs or symptoms of illness (fever, runny nose, cough, sore throat).     NOTE:  Your surgery may have to be cancelled if you:  You do not follow the fasting/bowel prep guidelines  You arrive late for check in at the hospital  Are having an outpatient surgery and you have not arranged for a responsible adult to accompany you home.       PREPARING FOR YOUR SURGERY:        Please wear clean loose-fitting clothes and leave valuables at home   Do not shave or remove body hair. Facial shaving is permitted. If you are having head surgery, ask your doctor  whether you can shave.  Bring a picture ID and your insurance card, and be prepared to pay your deductible or co-insurance by cash, check, or credit card when you arrive.   If you are going home after your surgery, please make sure to arrange for an adult to drive you home. You CANNOT use UBER or LYFT. If you do not have a ride, your surgery may be cancelled.    Visitor policy during the EYCXK-48 pandemic is subject to change. Current visitor policy can be found at [email or web address]       ON THE DAY OF YOUR SURGERY:     Check in at the location mentioned above   COVID testing may be done on arrival to the Pre-op or Procedural areas according to the current [facility]PH mandate.   If you are a woman of child bearing age, please note that you may be asked to give a urine sample upon check-in  You will meet your anesthesia and surgery teams in the preoperative holding area before surgery.   Once surgery is over, you will wake up in the recovery room.  If you go home, an adult chaperone will need to stay with you for the first 24 hours after surgery.   Visitor policy during the JEHUD-14 pandemic is subject to change. No more that two visitors are allowed per patient. Current visitor policy can be found at [email or web address]     A video about what to expect for the day of surgery can be found here:    [email or web address]  Or by searching "You-tube" for [place] before surgery and [place] after surgery     You medical records are available to you at [email or web address]       VISITOR POLICY  Please use the link below to review the most current [facility] visitor policy.    Visitors Actuary, Counselling psychologist  Restrictions  [facility] ([email or web address]    As a reminder, all visitors need to wear a mask or face covering when in our facilities, and we are still limiting the number of visitors. Children younger than 35 years of age are not allowed. Please  also encourage patients to have their visitors allow extra time to complete the verification process.      Your feedback is important to [name]  We strive to provide every patient with an exceptional patient care experience. You may receive a patient satisfaction survey in the next few weeks. Please fill out the survey upon receipt and return it in the self-addressed envelope. We value your feedback and use it to help improve the experience for all our patients at [facility]. Please call We Listen at [phone] if you want to share a positive experience you had at our facility or if you have suggestions about how we can improve our services. For medical questions or emergencies, please call your physician's office or 911. Thank you!      NOTICE: This correspondence contains confidential information that is being transmitted to and is intended only for the use of the recipient named above. Reading, disclosure, discussion, dissemination, distribution, or copying of this information by anyone other than the named recipient or his or her employees or agents is strictly prohibited. If you have received this document in error, please immediately destroy it and notify us by telephone at [phone].

## 2022-09-15 ENCOUNTER — Encounter (INDEPENDENT_AMBULATORY_CARE_PROVIDER_SITE_OTHER): Payer: Self-pay

## 2022-09-15 ENCOUNTER — Ambulatory Visit (INDEPENDENT_AMBULATORY_CARE_PROVIDER_SITE_OTHER): Payer: Medicare Other

## 2022-09-15 DIAGNOSIS — Z01818 Encounter for other preprocedural examination: Secondary | ICD-10-CM | POA: Insufficient documentation

## 2022-09-15 NOTE — Interdisciplinary (Signed)
Anesthesia Preparedness Clinic [facility]) RN PHONE CALL NOTE    Phone call to patient from [facility] RN today.     Confirmed medical history & that medications listed in Epic are accurate and up to date.     Patient scheduled for surgery on 09/29/2022 at [facility]    Confirmed preoperative instructions with patient including NPO instructions.     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

## 2022-09-15 NOTE — Patient Instructions (Signed)
Your surgery is currently scheduled at [facility] on 09/29/2022  The scheduler will be contacting you with the check in time                                                                                 [address], [place], [place] [address]  Check in and Operating room is down the elevator on the lower level (LL)    [place] structure, Microbiologist structure, or Dance movement psychotherapist parking (7am-5pm at [employer] entrance and [facility] Social research officer, government; 5am-5pm at [employer] by Emergency room/Labor and Delivery) for same cost as self-parking.  [email or web address]       QUESTIONS    If you have any questions between now and the day of your surgery, please do not hesitate to call:     [facility]: [facility] [facility] Preparedness clinic: ( medical office [place] building) [phone]  [place] [phone]      DAY OF SURGERY ARRIVAL TIME:    On the day of your Surgery/Procedure, please arrive at the time provided by the surgeon's clinic.  [facility] surgery Pre-op team will contact you the day before surgery.  Patients are limited to 2 visitors over the age of 77        [place]:        OK to take medications as scheduled with a small sip of water on the morning of surgery: your usual medications    PLEASE HOLD ALL NSAIDS (non-steroidal anti-inflammatory drugs) SUCH AS advil, aleve, motrin, ibuprofen, relafen, lodine, feldene, Diclofenac, voltaren, indomethacin, naproxen, celebrex, Mobic 7 days before surgery.       Please hold vitamins, supplements, CO-Q10, Glucosamine, herbs & fish oil 7 days before surgery. Vitamin C, D are ok to take.     It is OK to take acetaminophen (Tylenol) for pain around the time of surgery unless you have liver disease.      AFTER YOUR VISIT WITH [name], IF YOU START  TAKING A NEW MEDICATION BEFORE SURGERY, PLEASE CALL us TO MAKE SURE IT IS SAFE TO TAKE & WILL NOT AFFECT YOUR SURGERY.         OSA INSTRUCTIONS:     If you use a CPAP machine, please bring the entire machine, including mask and tubing, with you on the day of surgery.    TO DO LIST:     Labs to be done prior to surgery: URINE CULTURE AS DIRECTED BY YOUR SURGEON Please go to the LAB. Locations and hours can be found at [email or web address] Call before you go as some locations require appointments.          EATING/DRINKING     DO NOT EAT OR DRINK ANYTHING AFTER MIDNIGHT ON THE DAY OF SURGERY    Preparing for your Surgery:     Please wear clean loose-fitting clothes and leave valuables at home   Do not shave or remove body hair. Facial shaving is permitted. If you are having head/face surgery, ask your surgeon's office whether you can shave.  Bring a picture  ID and your insurance card, and be prepared to pay your deductible or co-insurance by cash, check, or credit card when you arrive.   All patients [facility] go home after their surgery, Please make sure to arrange for an adult to drive you home. You CANNOT use UBER or LYFT. If you do not have a ride, your surgery may be cancelled.       On The Day of Your Surgery:      Check in at the location mentioned above   COVID testing may be done on arrival to the Pre-op or Procedural areas according to the current CDPH mandate.   If you are a woman of child bearing age, please note that you may be asked to give a urine sample upon check-in  You will meet your anesthesia and surgery teams in the preoperative holding area before surgery.   Once surgery is over, you will wake up in the recovery room.  An adult chaperone will need to stay with you for the first 24 hours after surgery.   Visitor policy during the TDDUK-02 pandemic is subject to change.  No more that two visitors are allowed per patient. Current visitor policy can be found at  [email or web address]      A video about what to expect for the day of surgery can be found here:    [email or web address]  Or by searching "You-tube" for [place] before surgery and [place] after surgery     Your medical records are available to you at [email or web address] click sign up now.

## 2022-09-20 ENCOUNTER — Other Ambulatory Visit
Admission: RE | Admit: 2022-09-20 | Discharge: 2022-09-20 | Disposition: A | Discharge status: Home / Self Care | Payer: Medicare Other | Attending: Urology | Admitting: Urology

## 2022-09-20 DIAGNOSIS — R82998 Other abnormal findings in urine: Secondary | ICD-10-CM | POA: Insufficient documentation

## 2022-09-20 DIAGNOSIS — C642 Malignant neoplasm of left kidney, except renal pelvis: Secondary | ICD-10-CM

## 2022-09-21 ENCOUNTER — Other Ambulatory Visit: Payer: Medicare Other

## 2022-09-21 ENCOUNTER — Other Ambulatory Visit (HOSPITAL_BASED_OUTPATIENT_CLINIC_OR_DEPARTMENT_OTHER): Payer: Self-pay | Admitting: Nurse Practitioner

## 2022-09-21 DIAGNOSIS — Z981 Arthrodesis status: Secondary | ICD-10-CM

## 2022-09-22 MED ORDER — PREGABALIN 100 MG OR CAPS
100.0000 mg | ORAL_CAPSULE | Freq: Every evening | ORAL | 2 refills | Status: DC
Start: 2022-09-22 — End: 2022-10-26

## 2022-09-23 LAB — URINE CULTURE: Urine Culture Result: <10000

## 2022-09-25 NOTE — H&P (Signed)
Pre-Operative Urology History and Physical      Patient Name: Stephen [name]  MRN: [id number]  Room#: Room/bed info not found        ID: Casten Floren is a 71 year old presenting for L URS, poss laser fulguration lesion, poss stent.    71 year old male with a past medical history of solitary kidney, transurethral resection of the prostate recurrent bladder neck contracture, and recently diagnosed left sided filling defect on evaluation for hematuria.  CT urography on 04/22/2021 confirmed a filling defect in the anterior left renal pelvis.  Chest CT has been ordered but not performed yet.  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  He tolerated the procedure well.       He received 6 instillations of gelatinous mitomycin between August 05, 2021 and September 09, 2021.     MR urogram from October 26, 2021 with mild thickening in the left renal pelvis otherwise unremarkable.    Ureteroscopy and transurethral resection of bladder tumor on 06-01-2022 with no evidence of disease on pathology. Wide caliber UPJ stricture; stent was placed.    MRUrogram 07/15/22:  IMPRESSION:  Limited evaluation due to lack of IV contrast. Mild urothelial thickening persists in the left renal pelvis, likely posttreatment change but can not exclude residual disease.     Left percutaneous nephrostomy in place with improved mild left hydronephrosis.    (Relevant HPI copied from 06/14/22 clinic note)    Interval History:   Feels well. Denies chest pain, SOB, nausea, vomiting, fever, chills, dysuria.     Last PO: Before midnight.  Blood thinners: none  Urine Culture: <10k GPF 09/20/22      Patient Active Problem List    Diagnosis Date Noted    Pre-op evaluation 09/15/2022    Myalgia, other site 03/07/2022     Added automatically from request for surgery [id number]      Trigger point with back pain 03/07/2022      Added automatically from request for surgery [id number]      Urothelial carcinoma of kidney, left (CMS-HCC) 02/22/2022    Urinary tract infection associated with indwelling urethral catheter (CMS-HCC) 01/11/2022    Ureteral obstruction 01/11/2022    Hydronephrosis 01/11/2022    COVID-19 virus infection 12/10/2021    Community acquired pneumonia 12/10/2021    Acute urinary retention 12/10/2021    HTN (hypertension) 12/10/2021    Gout 12/10/2021    MDD (major depressive disorder) 12/10/2021    Urothelial cancer (CMS-HCC) 12/10/2021    Cauda equina compression (CMS-HCC) 10/29/2021    Ureter malignant neoplasm, left (CMS-HCC) 10/26/2021     Added automatically from request for surgery [id number]      Dorsopathy 06/28/2021     Added automatically from request for surgery [id number]      Malignant neoplasm of urinary bladder, unspecified site (CMS-HCC) 06/25/2021     Added automatically from request for surgery [id number]      Left renal mass 04/29/2021     Added automatically from request for surgery [id number]      Spondylolisthesis at L5-S1 level 08/18/2020    Chronic midline thoracic back pain 05/06/2020     Added automatically from request for surgery [id number]      Chronic left SI joint pain 02/19/2020     Added automatically from request for surgery [id number]      S/P lumbar fusion 09/06/2019  Cervical spondylosis 06/11/2019     Added automatically from request for surgery [id number]      Pre-procedure lab exam 04/22/2019     Added automatically from request for surgery [id number]      Cervical spondylosis without myelopathy 04/22/2019     Added automatically from request for surgery [id number]      Degenerative spondylolisthesis 03/24/2019     Added automatically from request for surgery [phone]      Lumbar stenosis with neurogenic claudication 03/24/2019     Added automatically from request for surgery [id number]      Chronic SI joint pain 12/20/2018     Added automatically from request for surgery [id number]      Lumbosacral spondylosis without  myelopathy 05/24/2018     Added automatically from request for surgery [id number]      Incomplete bladder emptying 02/01/2018     Added automatically from request for surgery [id number]      Cervical radiculopathy 01/15/2018     Added automatically from request for surgery [id number]      Lumbar radiculopathy 10/18/2017     Added automatically from request for surgery [id number]      Bladder neck contracture 10/11/2017     Added automatically from request for surgery [id number]      Post-traumatic stricture of anterior urethra 05/23/2017     Added automatically from request for surgery [id number]         Past Medical History:   Diagnosis Date    Chronic back pain     Congenital hydronephrosis     Gout     Headache     Hematuria     HTN (hypertension) 12/10/2021    Kidney disease     Kidney stones     Major depressive disorder, single episode     Polyarthropathy or polyarthritis of multiple sites     Retinal detachment     Urethral stricture        Past Surgical History:   Procedure Laterality Date    CT INSERTION OF SUPRAPUBIC CATH  09/25/2015    NEPHRECTOMY Right 1995    APPENDECTOMY      COLONOSCOPY      CYSTOSCOPY      CYSTOSCOPY W/ LASER LITHOTRIPSY      OTHER SURGICAL HISTORY      Interstim 01/29/2011    SPINE SURGERY  09/21    Lumbar-sacral fusion    TRANSURETHRAL RESECTION OF PROSTATE           Current Facility-Administered Medications   Medication Dose Route Frequency Provider Last Rate Last Admin    ceFAZolin in dextrose IVPB 2,000 mg  2,000 mg IntraVENOUS Once Carlota Raspberry, MD        lactated ringers infusion   IntraVENOUS Continuous Carlota Raspberry, MD 30 mL/hr at 09/29/22 0625 New Bag at 09/29/22 0625         Allergies   Allergen Reactions    Sulfa Drugs Unspecified         Family History   Adopted: Yes   Family history unknown: Yes       Social History     Socioeconomic History    Marital status: Single     Spouse name: Not on file    Number of children: Not on file    Years of education: Not on file    Highest education  level: Not on file   Occupational History    Not on file   Tobacco Use  Smoking status: Never    Smokeless tobacco: Never   Vaping Use    Vaping Use: Never used   Substance and Sexual Activity    Alcohol use: No    Drug use: Not Currently    Sexual activity: Not Currently     Partners: Female   Other Topics Concern    Military Service No    Blood Transfusions Yes    Caffeine Concern No    Occupational Exposure No    Hobby Hazards No    Sleep Concern Yes     Comment: due to my lower back discomfort and leg discomfort    Stress Concern Yes     Comment: Health/ housing/ financial    Weight Concern No    Special Diet No    Back Care Yes     Comment: Am very careful with any activity    Exercises Regularly Yes    Seat Belt Use Yes    Performs Self-Exams Yes    Bike Helmet Use No   Social History Narrative    Not on file     Social Determinants of Health     Financial Resource Strain: Not on file   Food Insecurity: Not on file   Transportation Needs: Not on file   Physical Activity: Not on file   Stress: Not on file   Social Connections: Not on file   Intimate Partner Violence: Not on file   Housing Stability: Not on file       Review Of Systems  As noted in HPI/PMH      Physical Exam:   Vitals: BP 159/70 (BP Location: Left arm, BP Patient Position: Semi-Fowlers)   Pulse (!) 44   Temp 96.8 F (36 C)   Resp 18   Ht _0  (1.702 m)   Wt 84.4 kg (186 lb)   SpO2 97%   BMI 29.13 kg/m     General Description: Alert and oriented. Affect normal.   Chest and Lungs: No deformity.   Heart: Regular rate and rhythm.   Abdomen: Soft, non-distended, non-tender.  Integument: No active lesion.  Neurologic: No focal deficit  Musculoskeletal: Moves all extremities without apparent deficit  Peripheral Vascular: extremities warm, well perfused    Recent Labs:  Lab Results   Component Value Date    NA 141 04/07/2022    K 4.5 04/07/2022    CL 104 04/07/2022    BICARB 25 04/07/2022    BUN 23 04/07/2022    CREAT 1.21 (H) 04/07/2022     GLU 112 (H) 04/07/2022    [place] 9.2 04/07/2022    AST 16 01/19/2022    ALT 25 01/19/2022    ALK 129 01/19/2022    TBILI 0.17 01/19/2022    ALB 4.5 04/07/2022    TP 5.4 (L) 01/19/2022       Lab Results   Component Value Date    WBC 5.4 04/07/2022    RBC 4.66 04/07/2022    HGB 13.3 (L) 04/07/2022    HCT 40.7 04/07/2022    MCV 87.3 04/07/2022    MCHC 32.7 04/07/2022    RDW 15.9 (H) 04/07/2022    PLT 221 04/07/2022    MPV 10.4 04/07/2022    LYMPHS 36 04/07/2022    MONOS 10 04/07/2022    EOS 2 04/07/2022    BASOS 1 04/07/2022       Lab Results   Component Value Date    PT 12.5 01/17/2022    INR  1.1 01/17/2022    PTT 31 01/11/2022         Assessment  Preoperative history and physical completed for Consented Surgical Procedure.    Operative site marked on the left side    PLAN  Proceed to OR for scheduled surgery    Code Status: Full Code/Full Care    Exie Parody, MD  Urology, PGY2

## 2022-09-27 ENCOUNTER — Other Ambulatory Visit: Payer: Self-pay

## 2022-09-28 ENCOUNTER — Encounter (HOSPITAL_BASED_OUTPATIENT_CLINIC_OR_DEPARTMENT_OTHER): Payer: Self-pay | Admitting: Anesthesiology

## 2022-09-28 ENCOUNTER — Other Ambulatory Visit (HOSPITAL_BASED_OUTPATIENT_CLINIC_OR_DEPARTMENT_OTHER): Payer: Self-pay | Admitting: Urology

## 2022-09-28 ENCOUNTER — Telehealth (HOSPITAL_BASED_OUTPATIENT_CLINIC_OR_DEPARTMENT_OTHER): Payer: Self-pay

## 2022-09-28 ENCOUNTER — Ambulatory Visit: Payer: Medicare Other | Attending: Anesthesiology | Admitting: Anesthesiology

## 2022-09-28 VITALS — BP 118/56 | HR 54

## 2022-09-28 DIAGNOSIS — Z419 Encounter for procedure for purposes other than remedying health state, unspecified: Secondary | ICD-10-CM

## 2022-09-28 DIAGNOSIS — M47817 Spondylosis without myelopathy or radiculopathy, lumbosacral region: Secondary | ICD-10-CM | POA: Insufficient documentation

## 2022-09-28 NOTE — Progress Notes (Signed)
PAIN CLINIC FOLLOW UP TELEMEDICINE NOTE    Primary Care Physician Laurine Blazer    SUBJECTIVE:  This is a 71 year old male with urothelial  s/p chemo and surgery now with L nephrostomy tube, chronic neck and low back pain s/p ALIF L5-S1 05/05/21 now s/p revision (L2-pelvis posterior fusion) with Dr. Lelon Huh on 15/17/61 now presenting as follow-up after a caudal epidural steroid on 08/02/22.    Patient reports minimal (<10%) relief from the caudal epidural for a limited time. Pain is now back to baseline at chronic 7/10 severity. No radiating pain. No new numbness/tingling, weakness, or bowel/bladder control changes. Walks, stretches. Not doing PT due to L nephrostomy tube.      Pain Treatments  Previous pain procedures and response include:   -08/02/22 caudal epidural injection - <10% relief  -03/24/22 repeat bilateral lumbar trigger point injections (no relief)  -07/22/21 Lumbar TPIs (minimal relief)  -05/31/19 left C4-C 6 diagnostic MBB (80% relief)  -RFA Left L3-L5 11/26/18 (did not provide much relief)  -Left L3-5 MBB 06/26/2018  -Cervical ESI 01/23/2018   -Lumbar ESI 11/24/2018    Pain medication  -Tylenol as needed  -Lyrica '100mg'$  at bedtime - not helpful  -Cymbalta 20 mg daily - not helpful  -Tizanidine - somewhat helpful    [facility] Return Patient [facility]  Main reason for today's visit: lower back pain  Any other concerns: hope to keep my mobility stable also hope my cancer checkup October 12 goes well  Have you had a pain procedure since your last visit? No  What procedure(s)?    How much relief have pain procedures provided?    Since your LAST visit to the pain clinic until NOW, how much relief have pain medications provided? 10%  Over the last 12 months, how many sessions of physical therapy have you done? Zero  Of those sessions of physical therapy, how many were over the last 6 months? Zero  If you had physical therapy, did it help?    Do you do home exercises? Yes  What exercises do  you do? Walking stretching  What does the pain feel like? Pressure;Tingling;Aching;Radiating;Squeezing;Itching  Do you have? Weakness;Muscle Spasms;Tingling;Tightness  Does the pain make it hard for you to: Walk;Sleep;Sit;Exercise  What other things are hard for you to do?    Rate your pain at its worst in the past week: 7  Rate your pain at its least in the past week: 7  Rate your pain on average in the past week: 7  What number best describes how, during the past week, pain has interfered with your enjoyment of life: 6  What number best describes how, during the past week, pain has interfered with your general activity: 6  PEG Score: 6  Have you had any new studies done to evaluate your pain since your last visit? No  Please list:      Patient's significant social history is:   Social History     Socioeconomic History    Marital status: Single     Spouse name: Not on file    Number of children: Not on file    Years of education: Not on file    Highest education level: Not on file   Occupational History    Not on file   Tobacco Use    Smoking status: Never    Smokeless tobacco: Never   Vaping Use    Vaping Use: Never used   Substance and Sexual  Activity    Alcohol use: No    Drug use: Not Currently    Sexual activity: Not Currently     Partners: Female   Other Topics Concern    Military Service No    Blood Transfusions Yes    Caffeine Concern No    Occupational Exposure No    Hobby Hazards No    Sleep Concern Yes     Comment: due to my lower back discomfort and leg discomfort    Stress Concern Yes     Comment: Health/ housing/ financial    Weight Concern No    Special Diet No    Back Care Yes     Comment: Am very careful with any activity    Exercises Regularly Yes    Seat Belt Use Yes    Performs Self-Exams Yes    Bike Helmet Use No   Social History Narrative    Not on file     Social Determinants of Health     Financial Resource Strain: Not on file   Food Insecurity: Not on file   Transportation Needs: Not on  file   Physical Activity: Not on file   Stress: Not on file   Social Connections: Not on file   Intimate Partner Violence: Not on file   Housing Stability: Not on file       Past medical history is significant for:   has a past medical history of Chronic back pain, Congenital hydronephrosis, Gout, Headache, Hematuria, HTN (hypertension) (12/10/2021), Kidney disease, Kidney stones, Major depressive disorder, single episode, Polyarthropathy or polyarthritis of multiple sites, Retinal detachment, and Urethral stricture.    He has no past medical history of AF (atrial fibrillation) (CMS-HCC), Asthma, Chronic kidney disease (CKD), Chronic obstructive airway disease (CMS-HCC), Clotting disorder (CMS-HCC), Congestive heart failure (CMS-HCC), COPD (chronic obstructive pulmonary disease) (CMS-HCC), Diabetes mellitus (CMS-HCC), Heart disease, Hypothyroidism, OSA (obstructive sleep apnea), or Seizures (CMS-HCC).    Current medications include:  Current Outpatient Medications   Medication Sig Dispense Refill    albuterol 108 (90 Base) MCG/ACT inhaler Inhale 2 puffs by mouth every 4 hours as needed for Wheezing. 6.7 g 0    allopurinol (ZYLOPRIM) 100 MG tablet Take 1 tablet (100 mg) by mouth daily. 30 tablet 0    ascorbic acid (VITAMIN C) 500 MG tablet Take 1 tablet (500 mg) by mouth daily.      ciclopirox (LOPROX) 0.77 % GEL Apply topically 2 times daily.      docusate sodium (COLACE) 250 MG capsule Take 1 capsule (250 mg) by mouth 2 times daily. 60 capsule 0    DULoxetine (CYMBALTA) 20 MG CR capsule Take 1 capsule (20 mg) by mouth daily. 90 capsule 3    ketotifen (ALAWAY) 0.025 % ophthalmic solution Place 1 drop into both eyes 2 times daily.      lidocaine (ASPERCREME) 4 % patch Apply 1 patch topically every 24 hours. Leave patch on for 12 hours, then remove for 12 hours. 30 patch 0    Loratadine (ALAVERT PO) Take 10 mg by mouth daily.      melatonin 5 MG tablet Take 1 tablet (5 mg) by mouth at bedtime. 30 tablet 0    Multiple  Vitamins-Minerals (MULTIVITAMIN WITH MINERALS) TABS tablet Take 1 tablet by mouth daily. 30 tablet 1    naloxone (NARCAN) 4 mg/0.1 mL nasal spray For suspected opioid overdose, call 911! Then spray once in one nostril. Repeat after 3 minutes if no or minimal response  using a new spray in other nostril. 2 each 0    Polyethyl Glycol-Propyl Glycol (SYSTANE ULTRA OP)       polyethylene glycol (GLYCOLAX) 17 GM/SCOOP powder Take 17 g by mouth daily. Mix with 4 to 8 ounces of fluid (water, juice, soda, coffee, or tea) prior to administration. 255 g 0    pregabalin (LYRICA) 100 MG capsule Take 1 capsule (100 mg) by mouth at bedtime. 30 capsule 2    senna (SENOKOT) 8.6 MG tablet Take 1 tablet (8.6 mg) by mouth daily. 30 tablet 0    tamsulosin (FLOMAX) 0.4 MG capsule TAKE 1 CAPSULE BY MOUTH EVERY DAY 90 capsule 1    tizanidine (ZANAFLEX) 2 MG tablet Take 1 tablet (2 mg) by mouth every 8 hours as needed (spasm). (Patient taking differently: Take 2 tablets (4 mg) by mouth every 8 hours as needed (spasm).) 40 tablet 0    traZODone (DESYREL) 50 MG tablet Take 0.5 tablets (25 mg) by mouth at bedtime. 30 tablet 0    triamcinolone (KENALOG) 0.1 % cream Apply 1 Application. topically 2 times daily. Apply a thin layer as directed      VITAMIN D PO Take 125 mg by mouth daily.       No current facility-administered medications for this visit.       Patient's current allergies are:  Sulfa drugs    OBJECTIVE:  Physical Exam  General: no distress, cooperative, pleasant  Mental Status:  alert, oriented x 3. Speech is clear/ normal  Affect: euthymic  Eyes:  Pupils not pinpoint.  CV:  No visible extremity edema.  Respiratory:  Breathing easily without tachypnea or bradypnea.  GI/Abdomen: Not-distended.  Skin: No rash visible    EXAM DESCRIPTION:  MRI LUMBAR SPINE WO/W IV CONTRAST: 12/03/2021    CLINICAL HISTORY:  "Urinary retention, new onset."     TECHNIQUE:  Multiplanar, multisequence MRI of the lumbar spine was performed prior to and  following intravenous administration of gadolinium contrast on a 1.5 Tesla scanner.     Contrast: 10 mL Gadavist (gadobutrol) IV.     COMPARISON:  Lumbosacral spine radiograph 11/23/2021, CT L-spine 11/01/2021, MRI lumbar spine 10/29/2021.     FINDINGS:  Please see the impression.    IMPRESSION:  1. The last well-formed disc is labeled L5-S1, similar to prior lumbar spine MRI.     2. Since the prior lumbar spine MRI, interval partial L2 and complete L3 laminectomies as well as L2-3 interbody fusion. Interval revision posterior fixation, now from L2-S1. There are additional postsurgical changes related to past L4 laminectomy, L3-4 and L4-5 interbody fusions, and L5-S1 anterior interbody fusion. There is a postsurgical 9.8 x 4.1 cm fluid collection extending from L2-L5 with mild rim enhancement.     3. Retrolisthesis of L2 on L3 has decreased from 5 to 3 mm while grade 2 anterolisthesis of L5 on S1 remains stable at 10 mm.     4. L2-3 spinal canal narrowing has substantially improved with mild residual overall narrowing. However, the previously seen right paracentral-subarticular inferiorly-directed disc extrusion has enlarged from 15 x 6 to 17 x 7 mm (CC x AP; Se 2, Im 8), associated with complete right lateral recess narrowing. Edema within the extruded component compatible with recent progression. Mild left and mild-moderate right foraminal narrowing, improved on both sides from prior lumbar spine MRI.     5. L3-4 spinal canal narrowing has improved with no residual. Mild left foraminal narrowing, slightly improved. Mild-moderate right foraminal narrowing,  slightly improved.     6. No significant spinal canal narrowing in the remaining visualized thoracic and lumbar levels. Persistent moderate-severe left and moderate right L4-5 and severe bilateral L5-S1 foraminal narrowing.     7. There is marrow edema in the L2 vertebral body and L3 superior endplate, likely related to stress reaction and degenerative changes.  Infectious process felt to be less likely. No definite abnormal intrathecal enhancement, although evaluation is limited by fixation and motion artifacts.      EXAM DESCRIPTION:  MRI CERVICAL SPINE POST CONTRAST ONLY:06/04/2022 1:32 pm     CLINICAL HISTORY:  71 year old male with abnormal MRI on 05/07/2022     TECHNIQUE:  T1 sequences were performed on a 3 Tesla  following 8 cc of Gadavist     COMPARISON:  MRI of the cervical spine dated 05/07/2022     FINDINGS:  No abnormal enhancement is seen.       IMPRESSION:  Specific attention was directed to the focal area of T2 hyperintensity with the spinal cord at the level of C4. No evidence of abnormal enhancement is identified. This finding most likely represents a prominent central canal.     No interval change in the multilevel degenerative disc disease.    ASSESSMENT  Encounter Diagnoses   Name Primary?    Lumbosacral spondylosis without myelopathy Yes       This is a 71 year old male presenting for a tele video visit with urothelial [place] s/p chemo and surgery with chronic neck and low back pain s/p ALIF L5-S1 05/05/21 now s/p revision (L2-pelvis posterior fusion) with Dr. Lelon Huh on 18/98/42 last seen by our service on 03/03/22 presenting as follow-up after caudal ESI on 08/02/22.    Patient's pain is likely multifactorial (canal stenosis, NF stenosis, discogenic, facet arthropathy). He has had numerous pain interventions including ESIs, MBB, RFA and TIPs which have not been helpful for him. At this point, he would benefit most from PT, especially water aerobics; however, he is limited at this time due to his nephrostomy tube. The patient is eager to return to PT once his tube is removed. Recommend following up with his PCP and urologist.      PLAN  Medication Plan:   No changes    Procedure Plan:   No procedural recommendations at this time    Other:  Recommend returning to PT as soon as possible    Follow-up:   -prn

## 2022-09-28 NOTE — Telephone Encounter (Addendum)
[facility] / Medical Oncology Social Work Telephone note:    [facility] called for psychosocial support prior to his procedure tomorrow with DR. Bagrodia, left ureteroscopy, biopsy, insertion of ureteral stent.  Pt said he is as prepared as possible, and he is eager to complete his surgery.    He has been working closely with Estevan Ryder for medical transportation for his health care and procedure tomorrow.  [place] has utilized his Hospital doctor.  He seems well prepared with the particulars of his care and emotionally prepared.  He voices understanding of his treatment plan.  He says he'll get results on 10/24.    He is seeing Dr. [name] for pain issues in his back.  There is no specific f/u plan.    He is also taking care of his dental health (dentures) and is almost done with his impressions.    [name] asked about another [place] for transportation which was awarded for $100 a year ago.  I told him to call and see if he still qualifies (needs to be in "active treatment" such as chemo or XRT).  If he does, I can assist with filling out, printing, and faxing grant paperwork for him.    [name] is aware I will be away from the office until 11/01.    Will follow as needed.    [name], [facility] Social Worker  [phone]

## 2022-09-28 NOTE — Progress Notes (Signed)
Attending Note:     Subjective:   I reviewed the history.   Patient interviewed and examined.   This is a 71 year old male who has a past medical history of Chronic back pain, Congenital hydronephrosis, Gout, Headache, Hematuria, HTN (hypertension) (12/10/2021), Kidney disease, Kidney stones, Major depressive disorder, single episode, Polyarthropathy or polyarthritis of multiple sites, Retinal detachment, and Urethral stricture. he  is here today for Low Back Pain and Leg Pain (Right leg pain)    This is a follow-up.    [facility]  Main reason for today's visit: lower back pain  Any other concerns: hope to keep my mobility stable also hope my cancer checkup October 12 goes well  Have you had a pain procedure since your last visit? No  What procedure(s)?    How much relief have pain procedures provided?    Since your LAST visit to the pain clinic until NOW, how much relief have pain medications provided? 10%  Over the last 12 months, how many sessions of physical therapy have you done? Zero  Of those sessions of physical therapy, how many were over the last 6 months? Zero  If you had physical therapy, did it help?    Do you do home exercises? Yes  What exercises do you do? Walking stretching  What does the pain feel like? Pressure;Tingling;Aching;Radiating;Squeezing;Itching  Do you have? Weakness;Muscle Spasms;Tingling;Tightness  Does the pain make it hard for you to: Walk;Sleep;Sit;Exercise  What other things are hard for you to do?    Rate your pain at its worst in the past week: 7  Rate your pain at its least in the past week: 7  Rate your pain on average in the past week: 7  What number best describes how, during the past week, pain has interfered with your enjoyment of life: 6  What number best describes how, during the past week, pain has interfered with your general activity: 6  PEG Score: 6  Have you had any new studies done to evaluate your pain since your last  visit? No  Please list:      Objective:   I have examined the patient and I concur with the fellow physician/resident physician/medical student exam as documented.   IR Tube Check    Result Date: 08/05/2022  IMPRESSION: Successful nephrostomy exchange on the left side with a 8.5 Fr tube. Plan: Routine nephrostomy exchange in 8-12 weeks.     MRI Urogram W/O Contrast Renal Failure Only    Result Date: 07/18/2022  IMPRESSION: Limited evaluation due to lack of IV contrast. Mild urothelial thickening persists in the left renal pelvis, likely posttreatment change but can not exclude residual disease. Left percutaneous nephrostomy in place with improved mild left hydronephrosis. Increased diffuse dilation of the main pancreatic duct without evidence of focal cut-off, nonspecific. Attention on follow-up. Surgically absent right kidney     CT Chest W/O Contrast    Result Date: 07/15/2022  IMPRESSION: No evidence of metastatic disease in the chest.    MRI Cervical Spine Post Contrast Only    Result Date: 06/07/2022  IMPRESSION: Specific attention was directed to the focal area of T2 hyperintensity with the spinal cord at the level of C4. No evidence of abnormal enhancement is identified. This finding most likely represents a prominent central canal. No interval change in the multilevel degenerative disc disease.     MRI Cervical Spine W/O Contrast    Result Date: 05/11/2022  IMPRESSION:  1. 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 No visible spinal cord edema. Postcontrast cervical spine MRI is recommended to exclude an associated enhancing process which may alter management. 2. Degenerative changes also result in severe foraminal stenosis on the left at C3-C4, C4-C5, bilaterally at C5-C6, and on the right at C6-C7, with expected mass effect on the respective exiting nerve  roots. Additional moderate degenerative foraminal stenosis on the left at C6-C7 and on the right at C7-T1 may also be symptomatic. 3. Fibrovascular degenerative endplate changes at R5-J8 and C7-T1 may contribute to local symptoms.    IR Nephrostogram    Result Date: 05/06/2022  IMPRESSION: Successful nephrostomy exchange on the LEFT side with a new 8.5 Fr tube. Plan: Routine nephrostomy exchange in 8-12 weeks.     X-Ray Lumbosacral Spine 2 Or 3 Views    Result Date: 04/18/2022  IMPRESSION: Alignment maintained, no complications.    IR Tube Check    Result Date: 03/28/2022  IMPRESSION: Successful nephrostomy check on the left side. Similar appearance of partial anterograde egress of contrast into the bladder. Plan: Follow-up with urology. Preventive maintenance nephrostomy exchange in 12 weeks. Can consider capping trial after completion of therapy if patient has adequate bladder function.     IR Nephrostogram    Result Date: 02/11/2022  IMPRESSION: Successful nephrostomy exchange on the left side with a 8.5 Fr tube. Minimal egress of contrast into the bladder. Plan: Follow-up with urology. Preventive maintenance nephrostomy exchange in 12 weeks.     X-Ray Chest Single View    Result Date: 01/18/2022  IMPRESSION: Right arm PICC followed to the mid superior vena cava, in good position. New linear right greater left basal opacities are suggestive of subsegmental atelectasis. However, early sequela of aspiration or developing multifocal pneumonia possible in the appropriate clinical context. Recommend continued attention on follow-up radiographs.    CT Chest W/O Contrast    Result Date: 01/12/2022  IMPRESSION: Marked improvement of multifocal pneumonia with some residual clustered nodular opacities which likely reflect airways centered infection rather than hematogenous spread. No new or worsened areas. No specific imaging features to suggest septic emboli. 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     CT  Head W/O Contrast    Result Date: 01/12/2022  IMPRESSION: 1. No acute intracranial abnormality.    IR Nephrostomy Tube    Result Date: 01/11/2022  IMPRESSION: Technically successful image-guided left percutaneous nephrostomy tube placement.     CT Abdomen And Pelvis W/O Contrast    Result Date: 01/11/2022  IMPRESSION: CT scan of the abdomen and pelvis without IV contrast. moderate left hydronephrosis, unchanged compared to 04/22/2021 CT urography. Note that assessment of solid organs is limited in the absence of intravenous contrast.     US Kidney Complete    Result Date: 01/10/2022  IMPRESSION: Moderate left hydronephrosis, mildly increased compared to prior. Partially visualized left nephroureteral stent in the left renal pelvis. Status post right nephrectomy.     X-Ray Chest Frontal And Lateral    Result Date: 01/10/2022  IMPRESSION: No acute findings. No pneumonia.    X-Ray Abdomen Single View    Result Date: 12/06/2021  IMPRESSION: 1. Unremarkable single view abdominal radiograph.     US Kidney Complete    Result Date: 12/03/2021  IMPRESSION: Persistent stable mild-to-moderate hydronephrosis, which is overall similar compared  to MR 10/25/2021. No obstructing stones visualized.     [name] Lower Extremity Venous Duplex Bilateral    Result Date: 12/03/2021  IMPRESSION: No evidence of deep venous thrombosis in the examined veins of the bilateral lower extremities.     CT L-Spine W/O IV Contrast    Result Date: 12/03/2021  IMPRESSION: Post revision posterior instrumented fusion and interbody fusion of L2-S1 with expected postsurgical changes in the posterior paraspinous soft tissues, better evaluated on contrast-enhanced MRI performed on same date. No acute fracture or subluxation. No evidence of hardware complication. No significant spinal canal narrowing. Multilevel neural foraminal narrowing, moderate to severe at L4-L5 and L5-S1; better evaluated on MRI.     MRI Lumbar Spine W/WO IV Contrast    Result Date:  12/03/2021  IMPRESSION: 1. The last well-formed disc is labeled L5-S1, similar to prior lumbar spine MRI. 2. Since the prior lumbar spine MRI, interval partial L2 and complete L3 laminectomies as well as L2-3 interbody fusion. Interval revision posterior fixation, now from L2-S1. There are additional postsurgical changes related to past L4 laminectomy, L3-4 and L4-5 interbody fusions, and L5-S1 anterior interbody fusion. There is a postsurgical 9.8 x 4.1 cm fluid collection extending from L2-L5 with mild rim enhancement. 3. Retrolisthesis of L2 on L3 has decreased from 5 to 3 mm while grade 2 anterolisthesis of L5 on S1 remains stable at 10 mm. 4. L2-3 spinal canal narrowing has substantially improved with mild residual overall narrowing. However, the previously seen right paracentral-subarticular inferiorly-directed disc extrusion has enlarged from 15 x 6 to 17 x 7 mm (CC x AP; Se 2, Im 8), associated with complete right lateral recess narrowing. Edema within the extruded component compatible with recent progression. Mild left and mild-moderate right foraminal narrowing, improved on both sides from prior lumbar spine MRI. 5. L3-4 spinal canal narrowing has improved with no residual. Mild left foraminal narrowing, slightly improved. Mild-moderate right foraminal narrowing, slightly improved. 6. No significant spinal canal narrowing in the remaining visualized thoracic and lumbar levels. Persistent moderate-severe left and moderate right L4-5 and severe bilateral L5-S1 foraminal narrowing. 7. There is marrow edema in the L2 vertebral body and L3 superior endplate, likely related to stress reaction and degenerative changes. Infectious process felt to be less likely. No definite abnormal intrathecal enhancement, although evaluation is limited by fixation and motion artifacts.     CTA Pulmonary Embolus    Result Date: 12/02/2021  IMPRESSION: 1. No evidence of pulmonary embolism. 2. Multifocal pneumonia. 3. Ectatic  ascending aorta. 4. Mild amount of coronary artery calcifications. 5. Few additional findings above.     X-Ray Chest Single View    Result Date: 12/02/2021  IMPRESSION: Increased bibasal opacities of atelectasis/pneumonia with possible right pleural effusion.     X-Ray Chest Single View    Result Date: 12/01/2021  IMPRESSION: Findings compatible with pneumonia.    X-Ray Knee 1 Or 2 Views - Right    Result Date: 12/01/2021  IMPRESSION: Minimal tricompartmental degeneration of the knee joint.    X-Ray Lumbosacral Spine 2 Or 3 Views    Result Date: 11/23/2021  IMPRESSION: No complications at recent revision spinal fusion, with combined interbody and posterior instrumented fixation from L2 through S1.    X-Ray Thoracolumbar Spine 2 Views    Result Date: 11/10/2021  IMPRESSION: No acute fracture of the imaged thoracolumbar spine. Recent revision spinal fusion from L2 to S1 is incompletely imaged on these radiographs and is better assessed on previous day lumbosacral spine radiographs.  X-Ray Lumbosacral Spine 2 Or 3 Views    Result Date: 11/09/2021  IMPRESSION: Satisfactory appearance status post extension of spinal fixation of to L2.    X-Ray Chest Single View    Result Date: 11/04/2021  IMPRESSION: No acute findings.    CT L-Spine W/O IV Contrast    Result Date: 11/02/2021  IMPRESSION: Post-surgical changes from prior L3-S1 posterior spinal fusion interbody fusion at these levels. No evidence of hardware complication. No significant change in the alignment compared to the MRI of the lumbar spine from 10/29/2021. Refer to the report from that exam regarding the degree of spinal canal and neural foraminal stenosis.     MRI Lumbar Spine W/WO IV Contrast    Result Date: 10/29/2021  IMPRESSION: Postsurgical changes from prior L3 with adjacent level disease at L2-L3-S1 spinal fusion, in conjunction with right paracentral disc extrusion and retrolisthesis, resulting in severe compression of the cauda equina, which appears  progressed from prior exam. Severe right and moderate left neural foraminal stenosis at L2-L3 which have progressed from 05/22/2020. Important findings delineated above were communicated via Epic by Lubertha South, Radiology  to the care provider, Oralia Rud, ED at 15:50 on 10/29/2021. Important but non urgent findings/recommendations delineated above were relayed to the care provider, Wilhemena Durie, by direct EPIC message at 17:00 on 10/29/2021.   CTRM:2003:epic.     X-Ray Lumbosacral Spine 2 Or 3 Views    Result Date: 10/29/2021  IMPRESSION: No fracture or acute abnormality. No significant change since prior. No hardware complications at L3 through S1 instrumented fusion. Adjacent segment degenerative disc disease at L2/L3 with 7 mm retrolisthesis.    MRI Urogram W/WO Contrast    Result Date: 10/25/2021  IMPRESSION: History of left urothelial cancer with interval resection of the large renal pelvic filling defect with only mild residual urothelial thickening of the renal pelvis, which could represent  post-treatment changes although cannot entirely exclude residual disease. Persistent mild to moderate left hydronephrosis. Prior right nephrectomy.     Assessment and plan reviewed with the fellow physician/resident physician/medical student.   I agree with the fellow physician/resident physician/medical student as documented.     Encounter Diagnoses   Name Primary?    Lumbosacral spondylosis without myelopathy Yes       Stephen [name] was seen today for low back pain and leg pain.    Diagnoses and all orders for this visit:    Lumbosacral spondylosis without myelopathy  Overview:  Added automatically from request for surgery [phone]          Patient Instructions   Please follow up with your primary care physician.  No further follow up needed in the pain clinic.  I have examined the patient, discussed the findings, reviewed the plan, and answered all questions with the patient.  See the fellow physician/resident physician/medical  student note for further details.    Medical Decision Making  Today reviewed notes from prior visit(s) with [facility]    Total duration of encounter spent in pre-visit (reviewing last visit and reviewing prior Epic notes), intra-visit (updating relevant history, performing physical exam, and medical discussion with patient), and post-visit (note completion) on the day of the encounter, excluding separately reportable services/procedures: 20 minutes.

## 2022-09-28 NOTE — Telephone Encounter (Signed)
From: Stephen [name]  To: Stephen Corn, MD  Sent: 09/28/2022 1:38 PM PDT  Subject: Info on pain control     just out to see you this morning forgot to ask you about a couple of advertised pain products/ QCKINETICS and relief factor also do you have the phone number for the CBD department? :[name] [phone] thank you

## 2022-09-28 NOTE — Patient Instructions (Signed)
Please follow up with your primary care physician.  No further follow up needed in the pain clinic.

## 2022-09-29 ENCOUNTER — Ambulatory Visit (HOSPITAL_BASED_OUTPATIENT_CLINIC_OR_DEPARTMENT_OTHER): Payer: Medicare Other | Admitting: Anesthesiology

## 2022-09-29 ENCOUNTER — Encounter (HOSPITAL_BASED_OUTPATIENT_CLINIC_OR_DEPARTMENT_OTHER): Admission: RE | Disposition: A | Discharge status: Home Health | Payer: Self-pay | Attending: Urology

## 2022-09-29 ENCOUNTER — Ambulatory Visit
Admission: RE | Admit: 2022-09-29 | Discharge: 2022-09-29 | Disposition: A | Discharge status: Home / Self Care | Payer: Medicare Other | Attending: Urology | Admitting: Urology

## 2022-09-29 ENCOUNTER — Other Ambulatory Visit (HOSPITAL_BASED_OUTPATIENT_CLINIC_OR_DEPARTMENT_OTHER): Payer: Self-pay | Admitting: Vascular & Interventional Radiology

## 2022-09-29 ENCOUNTER — Encounter (HOSPITAL_BASED_OUTPATIENT_CLINIC_OR_DEPARTMENT_OTHER): Payer: Self-pay

## 2022-09-29 ENCOUNTER — Encounter (HOSPITAL_BASED_OUTPATIENT_CLINIC_OR_DEPARTMENT_OTHER): Payer: Self-pay | Admitting: Urology

## 2022-09-29 ENCOUNTER — Ambulatory Visit: Payer: Medicare Other

## 2022-09-29 DIAGNOSIS — M109 Gout, unspecified: Secondary | ICD-10-CM | POA: Insufficient documentation

## 2022-09-29 DIAGNOSIS — Z882 Allergy status to sulfonamides status: Secondary | ICD-10-CM | POA: Insufficient documentation

## 2022-09-29 DIAGNOSIS — N135 Crossing vessel and stricture of ureter without hydronephrosis: Secondary | ICD-10-CM

## 2022-09-29 DIAGNOSIS — Z08 Encounter for follow-up examination after completed treatment for malignant neoplasm: Secondary | ICD-10-CM

## 2022-09-29 DIAGNOSIS — N3289 Other specified disorders of bladder: Secondary | ICD-10-CM

## 2022-09-29 DIAGNOSIS — M4722 Other spondylosis with radiculopathy, cervical region: Secondary | ICD-10-CM | POA: Insufficient documentation

## 2022-09-29 DIAGNOSIS — G8929 Other chronic pain: Secondary | ICD-10-CM | POA: Insufficient documentation

## 2022-09-29 DIAGNOSIS — C679 Malignant neoplasm of bladder, unspecified: Secondary | ICD-10-CM | POA: Insufficient documentation

## 2022-09-29 DIAGNOSIS — Z79899 Other long term (current) drug therapy: Secondary | ICD-10-CM

## 2022-09-29 DIAGNOSIS — Z905 Acquired absence of kidney: Secondary | ICD-10-CM | POA: Insufficient documentation

## 2022-09-29 DIAGNOSIS — Z419 Encounter for procedure for purposes other than remedying health state, unspecified: Secondary | ICD-10-CM

## 2022-09-29 DIAGNOSIS — M47817 Spondylosis without myelopathy or radiculopathy, lumbosacral region: Secondary | ICD-10-CM | POA: Insufficient documentation

## 2022-09-29 DIAGNOSIS — M48062 Spinal stenosis, lumbar region with neurogenic claudication: Secondary | ICD-10-CM | POA: Insufficient documentation

## 2022-09-29 DIAGNOSIS — I10 Essential (primary) hypertension: Secondary | ICD-10-CM | POA: Insufficient documentation

## 2022-09-29 DIAGNOSIS — N3000 Acute cystitis without hematuria: Secondary | ICD-10-CM

## 2022-09-29 DIAGNOSIS — C642 Malignant neoplasm of left kidney, except renal pelvis: Secondary | ICD-10-CM

## 2022-09-29 DIAGNOSIS — Z981 Arthrodesis status: Secondary | ICD-10-CM | POA: Insufficient documentation

## 2022-09-29 DIAGNOSIS — N131 Hydronephrosis with ureteral stricture, not elsewhere classified: Secondary | ICD-10-CM | POA: Insufficient documentation

## 2022-09-29 DIAGNOSIS — Z85528 Personal history of other malignant neoplasm of kidney: Secondary | ICD-10-CM

## 2022-09-29 DIAGNOSIS — R9341 Abnormal radiologic findings on diagnostic imaging of renal pelvis, ureter, or bladder: Secondary | ICD-10-CM | POA: Insufficient documentation

## 2022-09-29 DIAGNOSIS — M5416 Radiculopathy, lumbar region: Secondary | ICD-10-CM | POA: Insufficient documentation

## 2022-09-29 DIAGNOSIS — Z9049 Acquired absence of other specified parts of digestive tract: Secondary | ICD-10-CM | POA: Insufficient documentation

## 2022-09-29 DIAGNOSIS — Z8616 Personal history of COVID-19: Secondary | ICD-10-CM | POA: Insufficient documentation

## 2022-09-29 DIAGNOSIS — N320 Bladder-neck obstruction: Secondary | ICD-10-CM | POA: Insufficient documentation

## 2022-09-29 DIAGNOSIS — N2889 Other specified disorders of kidney and ureter: Secondary | ICD-10-CM

## 2022-09-29 DIAGNOSIS — Z9889 Other specified postprocedural states: Secondary | ICD-10-CM | POA: Insufficient documentation

## 2022-09-29 DIAGNOSIS — F329 Major depressive disorder, single episode, unspecified: Secondary | ICD-10-CM | POA: Insufficient documentation

## 2022-09-29 DIAGNOSIS — Z936 Other artificial openings of urinary tract status: Secondary | ICD-10-CM | POA: Insufficient documentation

## 2022-09-29 DIAGNOSIS — N3020 Other chronic cystitis without hematuria: Secondary | ICD-10-CM

## 2022-09-29 DIAGNOSIS — M4317 Spondylolisthesis, lumbosacral region: Secondary | ICD-10-CM | POA: Insufficient documentation

## 2022-09-29 DIAGNOSIS — Z87442 Personal history of urinary calculi: Secondary | ICD-10-CM | POA: Insufficient documentation

## 2022-09-29 DIAGNOSIS — N35013 Post-traumatic anterior urethral stricture: Secondary | ICD-10-CM | POA: Insufficient documentation

## 2022-09-29 SURGERY — URETEROSCOPY, NON-STONE
Anesthesia: General | Site: Ureter | Laterality: Left | Wound class: Class I (Clean)

## 2022-09-29 MED ORDER — LIDOCAINE HCL 2% EX GEL (UROJET)
Status: AC
Start: 2022-09-29 — End: 2022-09-29
  Filled 2022-09-29: qty 10

## 2022-09-29 MED ORDER — ONDANSETRON HCL 4 MG/2ML IV SOLN
INTRAMUSCULAR | Status: DC | PRN
Start: 2022-09-29 — End: 2022-09-29
  Administered 2022-09-29: 4 mg via INTRAVENOUS

## 2022-09-29 MED ORDER — MIDAZOLAM HCL 2 MG/2ML IJ SOLN
INTRAMUSCULAR | Status: DC | PRN
Start: 2022-09-29 — End: 2022-09-29
  Administered 2022-09-29: 2 mg via INTRAVENOUS

## 2022-09-29 MED ORDER — LIDOCAINE HCL 2 % IJ SOLN WRAPPED RECORD
INTRAMUSCULAR | Status: DC | PRN
Start: 2022-09-29 — End: 2022-09-29
  Administered 2022-09-29: 100 mg via INTRAVENOUS

## 2022-09-29 MED ORDER — GENTAMICIN SULFATE 40 MG/ML IJ SOLN
INTRAMUSCULAR | Status: DC | PRN
Start: 2022-09-29 — End: 2022-09-29
  Administered 2022-09-29: 320 mg via INTRAVENOUS

## 2022-09-29 MED ORDER — FENTANYL CITRATE (PF) 50 MCG/ML IJ SOLN (WRAPPED RECORD) ~~LOC~~
50.0000 ug | INTRAMUSCULAR | Status: DC | PRN
Start: 2022-09-29 — End: 2022-09-29

## 2022-09-29 MED ORDER — ROCURONIUM BROMIDE 50 MG/5ML IV SOLN
INTRAVENOUS | Status: AC
Start: 2022-09-29 — End: 2022-09-29
  Filled 2022-09-29: qty 5

## 2022-09-29 MED ORDER — ACETAMINOPHEN 500 MG OR TABS
1000.0000 mg | ORAL_TABLET | Freq: Three times a day (TID) | ORAL | 0 refills | Status: DC | PRN
Start: 2022-09-29 — End: 2023-12-25

## 2022-09-29 MED ORDER — LACTATED RINGERS IV SOLN
INTRAVENOUS | Status: DC | PRN
Start: 2022-09-29 — End: 2022-09-29

## 2022-09-29 MED ORDER — ACETAMINOPHEN 325 MG PO TABS
975.0000 mg | ORAL_TABLET | Freq: Once | ORAL | Status: AC
Start: 2022-09-29 — End: 2022-09-29
  Administered 2022-09-29: 975 mg via ORAL
  Filled 2022-09-29: qty 3

## 2022-09-29 MED ORDER — LIDOCAINE HCL (PF) 2 % IJ SOLN
INTRAMUSCULAR | Status: AC
Start: 2022-09-29 — End: 2022-09-29
  Filled 2022-09-29: qty 5

## 2022-09-29 MED ORDER — AMPICILLIN-SULBACTAM SODIUM 3 (2-1) GM IJ SOLR
INTRAMUSCULAR | Status: AC
Start: 2022-09-29 — End: 2022-09-29
  Filled 2022-09-29: qty 3000

## 2022-09-29 MED ORDER — HEPARIN SODIUM (PORCINE) 5000 UNIT/ML IJ SOLN
5000.0000 [IU] | Freq: Once | INTRAMUSCULAR | Status: DC
Start: 2022-09-29 — End: 2022-09-29

## 2022-09-29 MED ORDER — EPHEDRINE SULFATE DILUTION 5 MG/ML IJ SOLN
INTRAVENOUS | Status: AC
Start: 2022-09-29 — End: 2022-09-29
  Filled 2022-09-29: qty 10

## 2022-09-29 MED ORDER — PROPOFOL 200 MG/20ML IV EMUL
INTRAVENOUS | Status: AC
Start: 2022-09-29 — End: 2022-09-29
  Filled 2022-09-29: qty 40

## 2022-09-29 MED ORDER — EPHEDRINE SULFATE (PRESSORS) 5 MG/ML IV SOLN
INTRAVENOUS | Status: DC | PRN
Start: 2022-09-29 — End: 2022-09-29
  Administered 2022-09-29: 5 mg via INTRAVENOUS
  Administered 2022-09-29: 10 mg via INTRAVENOUS

## 2022-09-29 MED ORDER — CEFAZOLIN SODIUM 1 GM IJ SOLR
INTRAMUSCULAR | Status: AC
Start: 2022-09-29 — End: 2022-09-29
  Filled 2022-09-29: qty 2000

## 2022-09-29 MED ORDER — CELECOXIB 200 MG OR CAPS
200.0000 mg | ORAL_CAPSULE | Freq: Once | ORAL | Status: DC
Start: 2022-09-29 — End: 2022-09-29

## 2022-09-29 MED ORDER — FENTANYL CITRATE (PF) 250 MCG/5ML IJ SOLN
INTRAMUSCULAR | Status: DC | PRN
Start: 2022-09-29 — End: 2022-09-29
  Administered 2022-09-29: 50 ug via INTRAVENOUS
  Administered 2022-09-29: 25 ug via INTRAVENOUS
  Administered 2022-09-29 (×2): 50 ug via INTRAVENOUS

## 2022-09-29 MED ORDER — CEFAZOLIN SODIUM-DEXTROSE 2-3 GM-%(50ML) IV SOLR
2000.0000 mg | Freq: Once | INTRAVENOUS | Status: DC
Start: 2022-09-29 — End: 2022-09-29
  Filled 2022-09-29: qty 50

## 2022-09-29 MED ORDER — ROCURONIUM BROMIDE 100 MG/10ML IV SOLN
INTRAVENOUS | Status: DC | PRN
Start: 2022-09-29 — End: 2022-09-29
  Administered 2022-09-29: 50 mg via INTRAVENOUS

## 2022-09-29 MED ORDER — SUGAMMADEX SODIUM 200 MG/2ML IV SOLN
INTRAVENOUS | Status: DC | PRN
Start: 2022-09-29 — End: 2022-09-29
  Administered 2022-09-29: 180 mg via INTRAVENOUS

## 2022-09-29 MED ORDER — LIDOCAINE HCL 2% EX GEL (UROJET)
Status: DC | PRN
Start: 2022-09-29 — End: 2022-09-29
  Administered 2022-09-29: 10 mL via URETHRAL

## 2022-09-29 MED ORDER — FENTANYL CITRATE (PF) 100 MCG/2ML IJ SOLN
INTRAMUSCULAR | Status: AC
Start: 2022-09-29 — End: 2022-09-29
  Filled 2022-09-29: qty 2

## 2022-09-29 MED ORDER — NALOXONE HCL 0.4 MG/ML IJ SOLN
0.1000 mg | INTRAMUSCULAR | Status: DC | PRN
Start: 2022-09-29 — End: 2022-09-29

## 2022-09-29 MED ORDER — ONDANSETRON HCL 4 MG/2ML IV SOLN
INTRAMUSCULAR | Status: AC
Start: 2022-09-29 — End: 2022-09-29
  Filled 2022-09-29: qty 2

## 2022-09-29 MED ORDER — GENTAMICIN IN SALINE 0.8-0.9 MG/ML-% IV SOLN
INTRAVENOUS | Status: AC
Start: 2022-09-29 — End: 2022-09-29
  Filled 2022-09-29: qty 300

## 2022-09-29 MED ORDER — PROPOFOL 200 MG/20ML IV EMUL
INTRAVENOUS | Status: DC | PRN
Start: 2022-09-29 — End: 2022-09-29
  Administered 2022-09-29: 200 mg via INTRAVENOUS
  Administered 2022-09-29 (×2): 50 mg via INTRAVENOUS

## 2022-09-29 MED ORDER — OXYCODONE HCL 5 MG OR TABS
5.0000 mg | ORAL_TABLET | Freq: Once | ORAL | Status: DC | PRN
Start: 2022-09-29 — End: 2022-09-29

## 2022-09-29 MED ORDER — GENTAMICIN IN SALINE 0.8-0.9 MG/ML-% IV SOLN
INTRAVENOUS | Status: AC
Start: 2022-09-29 — End: 2022-09-29
  Filled 2022-09-29: qty 100

## 2022-09-29 MED ORDER — IOTHALAMATE MEGLUMINE 60 % IJ SOLN
INTRAMUSCULAR | Status: DC | PRN
Start: 2022-09-29 — End: 2022-09-29
  Administered 2022-09-29: 100 mL via INTRAVENOUS

## 2022-09-29 MED ORDER — AMPICILLIN SODIUM 1 GM IJ SOLR
INTRAMUSCULAR | Status: AC
Start: 2022-09-29 — End: 2022-09-29
  Filled 2022-09-29: qty 1000

## 2022-09-29 MED ORDER — DIPHENHYDRAMINE HCL 50 MG/ML IJ SOLN
12.5000 mg | Freq: Once | INTRAMUSCULAR | Status: DC | PRN
Start: 2022-09-29 — End: 2022-09-29

## 2022-09-29 MED ORDER — LACTATED RINGERS IV SOLN
INTRAVENOUS | Status: DC
Start: 2022-09-29 — End: 2022-09-29

## 2022-09-29 MED ORDER — MIDAZOLAM HCL 2 MG/2ML IJ SOLN
INTRAMUSCULAR | Status: AC
Start: 2022-09-29 — End: 2022-09-29
  Filled 2022-09-29: qty 2

## 2022-09-29 MED ORDER — DEXAMETHASONE SODIUM PHOSPHATE 20 MG/5ML IJ SOLN
INTRAMUSCULAR | Status: AC
Start: 2022-09-29 — End: 2022-09-29
  Filled 2022-09-29: qty 5

## 2022-09-29 MED ORDER — AMPICILLIN SODIUM 1 GM IV SOLR
INTRAVENOUS | Status: DC | PRN
Start: 2022-09-29 — End: 2022-09-29
  Administered 2022-09-29: 1000 mg via INTRAMUSCULAR

## 2022-09-29 MED ORDER — IOHEXOL 240 MG/ML IJ SOLN
INTRAMUSCULAR | Status: AC
Start: 2022-09-29 — End: 2022-09-29
  Filled 2022-09-29: qty 100

## 2022-09-29 SURGICAL SUPPLY — 25 items
BAG CYSTO DRAIN SKYTRON TABLE (Misc Surgical Supply) ×1 IMPLANT
BAG URINE DRAIN 200Ml (Lines/Drains)
CATHETER URETERAL OPEN END 5FR X 70CM (Procedural wires/sheaths/catheters/balloons/dilators) ×1 IMPLANT
CATHETER URETHRAL DUAL LUMEN 10 FR X 54 CM (Lines/Drains) ×1 IMPLANT
CONTAINER PRECISION SPECIMEN, 4OZ- STERILE (Misc Medical Supply) IMPLANT
DRAIN BAG URINE 2000 ML (Lines/Drains) IMPLANT
DRAPE X-RAY C ARM 41" X 74" (Drapes/towels) IMPLANT
FEE DELIVERY HOLMIUM LASER (Services) ×1
FEE RENTAL LASER HOLMIUM STAND-BY (Services) ×1 IMPLANT
GLOVE RADIATION 6.5 LTX (Gloves/Gowns) ×1 IMPLANT
GLOVE SURGEON BIOGEL SIZE 7.5 (Gloves/Gowns) ×1 IMPLANT
GUIDEWIRE AMPLATZ SUPER STIFF 0.035" X 145CM, 7CM BENTSON (Procedural wires/sheaths/catheters/balloons/dilators) ×1 IMPLANT
GUIDEWIRE SENSOR DUAL FLEX ANGLED TIP .035 X 150CM NITINOL (Procedural wires/sheaths/catheters/balloons/dilators) ×1 IMPLANT
GUIDEWIRE SENSOR DUAL FLEX STRAIGHT TIP .035 X 150CM NITINOL (Procedural wires/sheaths/catheters/balloons/dilators)
HOLDER FOLEY CATH SECUREMENT DEVICE STATLOCK (Patient Care Supply)
SCRUB, SURGICAL, EXIDINE, 4%, CHG, 4OZ (Prep Solutions) ×1
SHEATH URETERAL NAVIGATOR 11F/13F X 46CM (Procedural wires/sheaths/catheters/balloons/dilators) ×1 IMPLANT
SLEEVE SCD KNEE MEDIUM (Patient Care Supply) ×1 IMPLANT
SOLUTION IRR BAG .9% N/S 3000ML (Non-Pharmacy Meds/Solutions) ×2
SOLUTION IRR POUR BTL 0.9% NS 1000ML (Non-Pharmacy Meds/Solutions) ×1 IMPLANT
SOLUTION IRR POUR BTL H20 1000ML (Non-Pharmacy Meds/Solutions) ×1 IMPLANT
STENT URETERAL CONTOUR 7FR X 24CM (Ureteral Stents) ×1 IMPLANT
SURGICAL PACK CYSTO - SAME DAY (Procedure Packs/kits) ×1 IMPLANT
TUBING SUCTION MEDI-VAC 9/32" X 20' (Tubing/Suction) ×1 IMPLANT
VALVE ENDOSCOPIC ADJUSTABLE UROSEAL (Misc Surgical Supply) ×1

## 2022-09-29 NOTE — Addendum Note (Signed)
Addendum  created 09/29/22 1734 by Joanne Chars, MD    Child order released for a procedure order, Clinical Note Signed, Intraprocedure Blocks edited, LDA created via procedure documentation, SmartForm saved

## 2022-09-29 NOTE — Anesthesia Postprocedure Evaluation (Signed)
Anesthesia Post Note    Patient: Stephen [name]    Procedure(s) Performed: Procedure(s):  Left ureteroscopy, biopsy, insertion of ureteral stent      Final anesthesia type: General    Patient location: PACU    Post anesthesia pain: adequate analgesia    Mental status: awake, alert , and oriented    Airway Patent: Yes    Last Vitals:   Vitals Value Taken Time   BP 134/73 09/29/22 1015   Temp 36 C 09/29/22 0930   Pulse 54 09/29/22 0930   Resp 15 09/29/22 0930   SpO2 100 % 09/29/22 0930   Vitals shown include unvalidated device data.     Post vital signs: stable    Hydration: adequate    N/V:no    Anesthetic complications: no    Plan of care per primary team.

## 2022-09-29 NOTE — Anesthesia Preprocedure Evaluation (Addendum)
ANESTHESIA PRE-OPERATIVE EVALUATION    Patient Information    Name: Stephen [name]    MRN: [id number]    DOB: 10/30/1951    Age: 71 year old    Sex: male  Procedure(s):  Left ureteroscopy, possible biopsy, possible laser fulguration of lesion, possible cytology washings, possible insertion of ureteral stent      Pre-op Vitals:   BP 159/70 (BP Location: Left arm, BP Patient Position: Semi-Fowlers)   Pulse (!) 44   Temp 36 C   Resp 18   Ht 5' 7" (1.702 m)   Wt 84.4 kg (186 lb)   SpO2 97%   BMI 29.13 kg/m    BMI kg/m2: 29.13 kg/m2    Primary language spoken:  English    ROS/Medical History:      History of Present Illness: Stephen [name] is a 71 yo M who p/f L ureteroscopy with possible biopsy, laser fulguration of lesion, possible insertion of ureteral stent.    General:  no weight loss >10% of BW in last 6 months,  Unable to complete METS > 4 2/2 pain. States when he does exert himself he does not experience SOB, chest pain, palpitations, or other cardiac symptoms. Cardiovascular:  no CAD/Angina/MI/CABG/Stents,    no LV failure/CHF,   no valvular problems/murmurs,   EKG 11/2021: SB rate 51      TEE 130/2023  Summary:   1. The left ventricular size is normal. The left ventricular systolic function is normal.   2. The right ventricular size is normal and systolic function is normal.   3. No evidence of any gross valvular vegetations or perivalvular abscess   4. Compared to prior study , no significant change compared with prior TTE    TTE 1/272023  EF 75%  Summary:   1. The left ventricular size is normal. The left ventricular systolic function is normal.   2. Indeterminate left ventricular diastolic function.   3. The right ventricular size is normal and systolic function is normal.   4. Aortic valve sclerosis is present.   5. Mild aortic valve regurgitation observed with color flow Doppler.   6. Dilated aortic root.   7. Pulmonary artery pressure could not be measured because there was insufficient tricuspid  regurgitation.   8. No evidence of valvular vegetation noted. Would consider TEE if clinically indicated.   9. No previous study for comparison.     Denies any recent chest pain, shortness of breath, dizziness, palpitations, lower extremity edema, orthopnea, PND.   fair exercise tolerance, limited by low back pain, denies exertional CP or sig DOE     Anesthesia History:  no history of anesthetic complications,  no history of difficult IV access,  chronic pain patient,  Placement Time 1346; Placed By CRNA (specify); Insertion Attempts 1; Laryngoscope View Grade 1; Laryngoscopy Technique Glidescope (size 4); Size (mm) 7.0; Placement Verified By Auscultation, Capnometry; Removal Date 11/08/21  G3V required bougie with DL  Easy Glide  Placement Date 12/24/21; Placement Time 1001; Placed By Anesthesia, In OR, CRNA (specify); Insertion Attempts 1; Airway Device LMA; Size (mm) 4.0;   Pulmonary:   no asthma,  no home oxygen use,  no sleep apnea,  States no inhaler use.    Neuro/Psych:   negative for TIA/CVA,  no seizures,  psychiatric history,   Hematology/Oncology:       GI/Hepatic:   Infectious Disease:  negative for infectious disease     Renal:  chronic renal disease,   Endocrine/Other:     arthritis,   back pain,      Pregnancy History:   Pediatrics:         Pre Anesthesia Testing (PCC/CPC) notes/comments:                 Physical Exam    Airway:     Inter-inciser distance > 4 cm  Prognanth Unable    Mallampati: III  Neck ROM: full  TM distance: 5-6 cm  Short thick neck: No          Cardiovascular:     Comment: Bradycardic   Rhythm: regular   Rate: abnormal          Pulmonary:  - pulmonary exam normal              Neuro/Neck/Skeletal/Skin:  - Neck/Neuro/Skeletal/Skin exam normal          Dental:    Comment: Missing all teeth      Abdominal:   - normal exam           Additional Clinical Notes:           Last OSA (STOP BANG) Score:   Has a physician diagnosed you with sleep apnea?: No  Do you use a CPAP at home?: No  Do  you snore loudly (loud enough to be heard through a closed door)?: 0  Do you often feel tired, fatigued or sleepy during the day?: 0  Has anyone observed that you stop breathing while you are sleeping?: 0  Have you ever been treated for high blood pressure?: 1  OSA total score (A score of 2 or more is high risk. Offer patient sleep study.): 1      Past Medical History:   Diagnosis Date   . Chronic back pain    . Congenital hydronephrosis    . Gout    . Headache    . Hematuria    . HTN (hypertension) 12/10/2021   . Kidney disease    . Kidney stones    . Major depressive disorder, single episode    . Polyarthropathy or polyarthritis of multiple sites    . Retinal detachment    . Urethral stricture      Past Surgical History:   Procedure Laterality Date   . CT INSERTION OF SUPRAPUBIC CATH  09/25/2015   . NEPHRECTOMY Right 1995   . APPENDECTOMY     . COLONOSCOPY     . CYSTOSCOPY     . CYSTOSCOPY W/ LASER LITHOTRIPSY     . OTHER SURGICAL HISTORY      Interstim 01/29/2011   . SPINE SURGERY  09/21    Lumbar-sacral fusion   . TRANSURETHRAL RESECTION OF PROSTATE       Social History     Socioeconomic History   . Marital status: Single   Tobacco Use   . Smoking status: Never   . Smokeless tobacco: Never   Vaping Use   . Vaping Use: Never used   Substance and Sexual Activity   . Alcohol use: No   . Drug use: Not Currently   . Sexual activity: Not Currently     Partners: Female   Other Topics Concern   . Military Service No   . Blood Transfusions Yes   . Caffeine Concern No   . Occupational Exposure No   . Hobby Hazards No   . Sleep Concern Yes     Comment: due to my lower back discomfort and leg discomfort   . Stress  Concern Yes     Comment: Health/ housing/ financial   . Weight Concern No   . Special Diet No   . Back Care Yes     Comment: Am very careful with any activity   . Exercises Regularly Yes   . Seat Belt Use Yes   . Performs Self-Exams Yes   . Bike Helmet Use No     Alcohol Use: Not on file       Current  Facility-Administered Medications   Medication Dose Route Frequency Provider Last Rate Last Admin   . ceFAZolin in dextrose IVPB 2,000 mg  2,000 mg IntraVENOUS Once Bagrodia, Aditya, MD       . lactated ringers infusion   IntraVENOUS Continuous Bagrodia, Aditya, MD 30 mL/hr at 09/29/22 0625 New Bag at 09/29/22 0625     Allergies   Allergen Reactions   . Sulfa Drugs Unspecified       Labs and Other Data  Lab Results   Component Value Date    NA 141 04/07/2022    K 4.5 04/07/2022    CL 104 04/07/2022    BICARB 25 04/07/2022    BUN 23 04/07/2022    CREAT 1.21 (H) 04/07/2022    GLU 112 (H) 04/07/2022    CA 9.2 04/07/2022     Lab Results   Component Value Date    AST 16 01/19/2022    ALT 25 01/19/2022    GGT 131 (H) 01/17/2022    LDH 172 01/19/2022    ALK 129 01/19/2022    TP 5.4 (L) 01/19/2022    ALB 4.5 04/07/2022    TBILI 0.17 01/19/2022    DBILI <0.2 01/19/2022     Lab Results   Component Value Date    WBC 5.4 04/07/2022    RBC 4.66 04/07/2022    HGB 13.3 (L) 04/07/2022    HCT 40.7 04/07/2022    MCV 87.3 04/07/2022    MCHC 32.7 04/07/2022    RDW 15.9 (H) 04/07/2022    PLT 221 04/07/2022    MPV 10.4 04/07/2022    SEG 50 04/07/2022    LYMPHS 36 04/07/2022    MONOS 10 04/07/2022    EOS 2 04/07/2022    BASOS 1 04/07/2022     Lab Results   Component Value Date    INR 1.1 01/17/2022    PTT 31 01/11/2022     Lab Results   Component Value Date    ARTPH 7.48 (H) 11/08/2021    ARTPO2 213 (H) 11/08/2021    ARTPCO2 32 (L) 11/08/2021       Anesthesia Plan:  Risks and Benefits of Anesthesia  I have personally performed an appropriate pre-anesthesia physical exam of the patient (including heart, lungs, and airway) prior to the anesthetic and reviewed the pertinent medical history, drug and allergy history, laboratory and imaging studies and consultations.   I have determined that the patient has had adequate assessment and testing.  I have validated the documentation of these elements of the patient exam and/or have made necessary  changes to reflect my own observations during my pre-anesthesia exam.  Anesthetic techniques, invasive monitors, anesthetic drugs for induction, maintenance and post-operative analgesia, risks and alternatives have been explained to the patient and/or patient's representatives.    I have prescribed the anesthetic plan:         Planned anesthesia method: General         ASA 3 (Severe systemic disease)     Potential anesthesia   problems identified and risks including but not limited to the following were discussed with patient and/or patient's representative: Adverse or allergic drug reaction, Administration of blood products, Recall, Ocular injury, Dental injury or sore throat and Nerve injury    No Beta Blocker Indicated:     Planned monitoring method: Routine monitoring    Informed Consent:  Anesthetic plan and risks discussed with Patient.

## 2022-09-29 NOTE — Discharge Instructions (Signed)
Discharge Instructions Following Ureteroscopy, bladder biopsy      It is normal to have some burning when you urinate.     It is common to have blood in the urine after this procedure. It may be pink or even red; inform your doctor if you have a significant amount of clots in the urine or if you are unable to urinate at all. Be sure to drink plenty of fluids at all times.    You may have an internal stent (a hollow tube that runs from the kidney to your bladder), helping the urine to drain down from the kidney to your bladder after your surgery. Some patients do not notice that they have a stent, while others complain of the sensation of needing to urinate frequently, burning on urination, or even some back pain (especially when they go to urinate). These sensations usually improve gradually, some faster than others. This is not uncommon, but may initially warrant the use of pain medication, which you were prescribed. While the stent is in place, your urine may continue to be bloody. Your urologist will remove this stent as an outpatient.     Some patients are sent home with a foley catheter (a hollow tube which drains urine from your bladder), while others go home urinating on their own. If you still have a catheter, you will be provided with instructions regarding its care.     Drink at least 6-8 glasses of fluid per day; minimize night-time drinking if this causes you to awaken regularly to urinate.    You may resume your regular diet and regular medication regimen.     You may shower or bathe.     Additional medications for pain and stent discomfort that you may be prescribed: Take pyridium for burning pain in your urethra. This will cause your urine to turn [place], which is completely normal. You may also be provided with Tamsulosin, which helps relax the urinary tract.    As you have just undergone general anesthesia, you should refrain from driving, heavy lifting, smoking, alcohol consumption, or important  decision making for the next 24 hours. You may climb stairs and you may resume sexual activity.     Call your physician if you have a fever over 101F.     Make a follow up appointment with your urologist. A clinic scheduler should call you within the next three business days to schedule an appointment. If you do not hear from somebody, please call the number listed below.     For all other questions, contact the urology clinic using the following numbers.      Urology Contact Information:     If you have any questions about your hospital care, your medications, or if you have new or concerning symptoms soon after going home, and you need to contact your hospital physician, your hospital physician can be contacted in the following manner:    Business Hours (Monday - Friday; 8:00 AM - 4:30 PM): [phone]    After-Hours, Weekends/Holidays:  FOR EMERGENCY ISSUES ONLY, call [phone] and ask them to page the on-call Urologist. If it is not an emergency, then wait for business hours and call one of the numbers above.    If your clinic appointment has not been scheduled before you leave the hospital and you do not receive a call within 2 business days, call the clinic scheduler for your urologist's clinic to schedule the appointment.    Post-operative medications  Several non-opioid medications will be prescribed  Take medications as prescribed to stay ahead of the unpredictable pain following kidney stone surgery  It is ok to omit an individual medication if allergic or not covered by insurance.   The pain may worsen during the 48 hours after stent removal. Please continue medications for 2 days after stent removal.       Medication Duration  Purpose Notes   Acetaminophen (Tylenol) '1000mg'$   Every 8 hours alternating with ibuprofen until 2 days after stent removal or until pain is resolved.      If no stent, take until pain is resolved.  Pain medication No more than '4000mg'$  per 24-hour period   Ibuprofen (Motrin  or Advil) 400-'800mg'$   Every 8 hours alternating with Tylenol until 2 days after stent removal or until pain is resolved.     If no stent, take until pain is resolved.   Pain medication     Tamsulosin (Flomax) 0.'4mg'$  Every morning for 10 days  Relaxes ureter muscles and decreases kidney pain. Helps stone pieces pass.     Gabapentin '100mg'$  Every 8 hours as needed Pain medication    Pyridium '200mg'$  Every 8 hours as needed Urinary discomfort medication Only use if needed

## 2022-09-29 NOTE — Op Note (Signed)
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]    POSTOPERATIVE DIAGNOSIS: Same    PROCEDURE(s):   Procedure(s):  Left ureteroscopy, biopsy, insertion of ureteral stent - Wound Class: Class I (Clean) - Incision Closure: Deep and Superficial Layers      ANESTHESIA: GETTA  Anesthesiologist: Joanne Chars, MD     ANTIBIOTICS: Ampicillin, Gentamycin    OPERATIVE FINDINGS:  Erythematous patch at dome of bladder, biopsied  Indwelling L ureteral stent, removed  Mid ureteral narrowing, requiring dilation with 11Fr obturator then 11/13Fr access sheath  Flexible ureteroscopic evaluation of the left collecting system, no lesions concerning for malignancy  L ureteral stent replaced.    INDICATIONS FOR PROCEDURE:  Stephen [name] is a 71 year old presenting for L URS, poss laser fulguration lesion, poss stent. 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  CT urography on 04/22/2021 confirmed a filling defect in the anterior left renal pelvis.  Chest CT has been ordered but not performed yet.  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 He tolerated the procedure well.  He received 6 instillations of gelatinous mitomycin between August 05, 2021 and September 09, 2021. MR urogram from October 26, 2021 with mild thickening in the left renal pelvis otherwise unremarkable. Ureteroscopy and transurethral resection of bladder tumor on 06-01-2022 with no evidence of disease on  pathology. Wide caliber UPJ stricture; stent was placed. MRUrogram 07/15/22 showed limited evaluation due to lack of IV contrast. Mild urothelial thickening persists in the left renal pelvis, likely posttreatment change but can not exclude residual disease. Left percutaneous nephrostomy in place with improved mild left hydronephrosis. Here for evaluation of the bladder and left renal pelvis.    PROCEDURE IN DETAIL:  The patient was met and examined in the preoperative area. The indications, risks, benefits, and alternatives were explained to the patient.  All questions were answered and informed consent was obtained. The patient was then brought to the operating room and positioned supine on the surgical bed. Sequential compression devices were applied to bilateral lower extremities. Prior to induction of anesthesia, OR brief was performed confirming the correct patient, patient information, procedure and equipment. Once anesthesia was induced, the patient was placed in lithotomy position, prepped and draped in the usual sterile fashion, and antibiotics were administered.    After a timeout was performed, the 21-French rigid cystoscope was passed through the urethra into the bladder.  A systematic survey was performed of the bladder that revealed an erythematous patch at the dome of the bladder. No masses concerning for malignancy.      The left ureteral orifice was identified and cannulated with a 5-French open-ended and straight sensor wire. The wire was advanced to the kidney under fluoroscopy and was then secured as the safety wire. A retrograde pyelogram was performed to outline the collecting system.    A dual lumen catheter was used to dilate the ureter and facilitate the placement of a second wire. It was only  able to be advanced to the level of the mid ureter, where it met resistance. A second wire was introduced through the second lumen and advanced up to the renal pelvis as the working wire.  Next, a  flexible ureteroscope was advanced over the working wire up to that level of mid ureteral resistance, but was unable to be navigated across it.  Using the dual-lumen catheter, a Amplatz super stiff wire was advanced up to the level of the kidney, and a semi rigid ureteroscope was inserted into the ureter with attempt to traverse the narrowing, unsuccessfully.  Next, we then placed the 11Fr obturator of a 11-13Fr 46cm ureteral access sheath under fluoroscopy that was advanced to the ureteropelvic junction without resistance. This was removed and the 11-13Fr sheath was advanced to the elvel of the renal pelvis. Next, a flexible cystoscope was inserted into the ureteral access sheath and advanced into the renal pelvis.     On survey of the collecting system, a systematic survey showed no evidence of malignancy. The percutaneous nephrostomy tube was seen in the lower pole calyx.    The ureteral access sheath and the flexible ureteroscope were removed without evidence of ureteral injury. Using the safety wire, A 7x24Fr ureteral stent was placed over the safety wire. This was advanced up to the renal pelvis without difficulty and correct location of the proximal curl was confirmed under fluoroscopy. The distal curl was then confirmed in the bladder under fluoroscopy.     Next, using the 21Fr rigid cystoscope and rigid biopsy grasper, the erythematous patch at the dome of the bladder was biopsied.  Bugbee was used to fulgurate the site and achieve hemostasis. The bladder was drained, and this concluded the case.    The attending surgeon, Dr. Sallyanne Kuster, was present for the entirety of the case.    SPECIMENS:   ID Type Source Tests Collected by Time Destination   A : bladder dome biopsy  Biopsy Bladder PATHOLOGY TISSUE Veronia Beets, MD 09/29/2022 0827        IMPLANTS:   Implant Name Type Inv. Item Serial No. Manufacturer Lot No. LRB No. Used Action   STENT URETERAL CONTOUR 7FR X 24CM - [id number] Ureteral Stents  STENT URETERAL CONTOUR 7FR X 24CM  [employer] [id number] Left 1 Implanted       IV FLUIDS:  643m crystalloid    ESTIMATED BLOOD LOSS:   5cc    URINE OUTPUT:  N/a    COMPLICATIONS:   None    DISPOSITION: The patient was successfully extubated and taken to the post-anesthesia care unit in stable condition accompanied by a member of the surgical team    POST-OPERATIVE PLAN:  MRI 4-5 weeks  cysto stent removal in 6 weeks

## 2022-09-29 NOTE — Anesthesia Procedure Notes (Signed)
Intubation Procedure Note  Intubation Date & Time: 09/29/2022 7:32 AM   Universal Protocol: Verbal consent obtained, risks and benefits discussed, patient states understanding of the procedure being performed, the patient's understanding of the procedure matches consent given, procedure consent matches procedure scheduled, relevant documents present and verified, test results available and properly labeled, site marked, imaging studies available, required blood products, implants, devices, and special equipment available and Immediately prior to procedure a time out was called to verify the correct patient, procedure, equipment, support staff and site/side marked as required    Patient identity confirmed by: Patient identity confirmed by: verbally with patient    Consent given by: Consent given by: patient      Indications for Intubation: General Anesthetic   Procedure Details:   No Code Bag Used  no in-line stabilization  Intubation Method: Glidescope  Airway Adjuncts: None  Laryngoscopy View Grade: 1  Patient Status: Routine General        Tube/LMA Size (mm): 7.0  Tube Type: Cuffed  Number of attempts: 1      No Circoid Pressure    Post Procedure Details:  Assessment: Chest Rise, ETCO2 Monitor and Equal Bilateral Breath Sounds  Breath Sounds: Equal  Cuff Inflated  ETT to lip (cm): 22      Tube secured with: Adhesive Tape  bite block not used      Comments:     I, Joanne Chars, MD, was present for the entire procedure.    Joanne Chars, MD  09/29/2022 5:34 PM

## 2022-09-29 NOTE — Plan of Care (Signed)
Problem: Promotion of Perioperative Health and Safety  Goal: Promotion of Health and Safety of the Perioperative Patient  Description: The patient remains safe, receives treatment appropriate to the surgical intervention and patient's physiological needs and is discharged or transferred to the appropriate level of care.    Information below is the current care plan.  Outcome: Progressing  Flowsheets (Taken 09/29/2022 0825)  Guidelines: PACU  Individualized Interventions/Recommendations #1: Maintain and position for comfort and safety  Individualized Interventions/Recommendations #2 (if applicable): Pain control

## 2022-09-30 ENCOUNTER — Telehealth (HOSPITAL_BASED_OUTPATIENT_CLINIC_OR_DEPARTMENT_OTHER): Payer: Self-pay | Admitting: Urology

## 2022-09-30 NOTE — Telephone Encounter (Signed)
Per Dr. Sallyanne Kuster, schedule patient cysto removal in clinic for 11/21 and to cx appt 10/24    Attempted to call patient left VM w/ details   11/08/2022  9:00 AMCYSTO [351][place] [facility] UrologyBagrodia, Lindalou Hose, MD       Will follow up w/ patient to ensure he is aware of change

## 2022-10-03 ENCOUNTER — Telehealth (HOSPITAL_BASED_OUTPATIENT_CLINIC_OR_DEPARTMENT_OTHER): Payer: Self-pay

## 2022-10-03 NOTE — Telephone Encounter (Signed)
[place] / Medical Oncology Social Work Telephone note:    Stephen [name] called and e-mailed me several times about his new request for [employer] grant application for transportation.  (It's been over a year since his last application.)  He sent his verification of income that was needed.      Stephen [name] called the agency and received a personalized application.  In order to qualify, pt has to have been in "active treatment" within the past calendar year.  [name]'s app was forwarded to me via e-mail.  I filled out the application and faxed for [facility].      I called [facility] [employer] myself [phone]) and spoke with social worker Roderic Palau M.), who stated that Stephen [name] will qualify based on immunotherapy (BCG) treatments, which have occurred in the past year.      I performed a thorough chart review, and [name]'s last chemotherapy dose of mitomycin was given on 09/09/21.  As per chart, "He received 6 instillations of gelatinous mitomycin between August 05, 2021 and September 09, 2021."  Therefore, I could not submit grant based on chemo treatment within the last year, but I could submit based on surgery and immunotherapy.    Stephen [name] updated.  He v/u.    Will follow as needed.    [name], [place] Social Worker  [phone]

## 2022-10-04 ENCOUNTER — Encounter (HOSPITAL_BASED_OUTPATIENT_CLINIC_OR_DEPARTMENT_OTHER): Payer: Self-pay | Admitting: Urology

## 2022-10-06 ENCOUNTER — Encounter (HOSPITAL_BASED_OUTPATIENT_CLINIC_OR_DEPARTMENT_OTHER): Payer: Self-pay | Admitting: Hospital

## 2022-10-06 NOTE — Telephone Encounter (Signed)
Spoke to pt offered to send a message to Dr. Sallyanne Kuster re: results from 10/12 surgery, pt declined. Pt states he will wait until MD reaches out if needed.       Adivsed pt that his 11/21 appt is not w/ general anesthesia and that it would be completed in clinic. Also advised pt to complete the MRI Urogram prior to appt.     Pt verbalized understanding

## 2022-10-09 ENCOUNTER — Encounter (HOSPITAL_BASED_OUTPATIENT_CLINIC_OR_DEPARTMENT_OTHER): Payer: Self-pay | Admitting: Urology

## 2022-10-09 DIAGNOSIS — C642 Malignant neoplasm of left kidney, except renal pelvis: Secondary | ICD-10-CM

## 2022-10-09 NOTE — Telephone Encounter (Signed)
From: Lovena Le  To: Carlota Raspberry, MD  Sent: 10/09/2022 1:08 PM PDT  Subject: med for MRI    please order some Ativan for my MRI November 4 at [facility]/ thank you/ YF#[id number]

## 2022-10-11 ENCOUNTER — Ambulatory Visit (HOSPITAL_BASED_OUTPATIENT_CLINIC_OR_DEPARTMENT_OTHER): Payer: Medicare Other | Admitting: Urology

## 2022-10-14 MED ORDER — LORAZEPAM 1 MG OR TABS
1.0000 mg | ORAL_TABLET | Freq: Once | ORAL | 0 refills | Status: AC
Start: 2022-10-14 — End: 2022-10-14

## 2022-10-14 NOTE — Telephone Encounter (Signed)
Order signed.

## 2022-10-22 ENCOUNTER — Ambulatory Visit
Admission: RE | Admit: 2022-10-22 | Discharge: 2022-10-22 | Disposition: A | Discharge status: Home / Self Care | Payer: Medicare Other | Attending: Urology | Admitting: Urology

## 2022-10-22 DIAGNOSIS — K8689 Other specified diseases of pancreas: Secondary | ICD-10-CM

## 2022-10-22 DIAGNOSIS — Z936 Other artificial openings of urinary tract status: Secondary | ICD-10-CM

## 2022-10-22 DIAGNOSIS — R9389 Abnormal findings on diagnostic imaging of other specified body structures: Secondary | ICD-10-CM

## 2022-10-22 DIAGNOSIS — M899 Disorder of bone, unspecified: Secondary | ICD-10-CM

## 2022-10-22 DIAGNOSIS — Z960 Presence of urogenital implants: Secondary | ICD-10-CM

## 2022-10-22 DIAGNOSIS — Z8550 Personal history of malignant neoplasm of unspecified urinary tract organ: Secondary | ICD-10-CM | POA: Insufficient documentation

## 2022-10-22 DIAGNOSIS — N135 Crossing vessel and stricture of ureter without hydronephrosis: Secondary | ICD-10-CM | POA: Insufficient documentation

## 2022-10-22 DIAGNOSIS — Z9889 Other specified postprocedural states: Secondary | ICD-10-CM

## 2022-10-22 DIAGNOSIS — Z08 Encounter for follow-up examination after completed treatment for malignant neoplasm: Secondary | ICD-10-CM | POA: Insufficient documentation

## 2022-10-22 DIAGNOSIS — Z9079 Acquired absence of other genital organ(s): Secondary | ICD-10-CM

## 2022-10-22 DIAGNOSIS — Z8554 Personal history of malignant neoplasm of ureter: Secondary | ICD-10-CM

## 2022-10-25 ENCOUNTER — Ambulatory Visit (HOSPITAL_BASED_OUTPATIENT_CLINIC_OR_DEPARTMENT_OTHER): Payer: Medicare Other | Admitting: Urology

## 2022-10-26 ENCOUNTER — Other Ambulatory Visit (HOSPITAL_BASED_OUTPATIENT_CLINIC_OR_DEPARTMENT_OTHER): Payer: Self-pay | Admitting: Nurse Practitioner

## 2022-10-26 DIAGNOSIS — Z981 Arthrodesis status: Secondary | ICD-10-CM

## 2022-10-27 ENCOUNTER — Encounter (HOSPITAL_BASED_OUTPATIENT_CLINIC_OR_DEPARTMENT_OTHER): Payer: Self-pay

## 2022-10-27 MED ORDER — PREGABALIN 100 MG OR CAPS
100.0000 mg | ORAL_CAPSULE | Freq: Every evening | ORAL | 2 refills | Status: DC
Start: 2022-10-27 — End: 2022-11-25

## 2022-10-27 NOTE — Telephone Encounter (Signed)
Lyrica refilled

## 2022-11-01 ENCOUNTER — Encounter (HOSPITAL_BASED_OUTPATIENT_CLINIC_OR_DEPARTMENT_OTHER): Payer: Self-pay | Admitting: Vascular & Interventional Radiology

## 2022-11-01 ENCOUNTER — Ambulatory Visit
Admission: RE | Admit: 2022-11-01 | Discharge: 2022-11-01 | Disposition: A | Discharge status: Home / Self Care | Payer: Medicare Other | Source: Ambulatory Visit | Attending: Vascular & Interventional Radiology | Admitting: Vascular & Interventional Radiology

## 2022-11-01 ENCOUNTER — Ambulatory Visit (HOSPITAL_BASED_OUTPATIENT_CLINIC_OR_DEPARTMENT_OTHER): Payer: Medicare Other

## 2022-11-01 ENCOUNTER — Encounter (HOSPITAL_BASED_OUTPATIENT_CLINIC_OR_DEPARTMENT_OTHER)
Admission: RE | Disposition: A | Discharge status: Home Health | Payer: Self-pay | Source: Ambulatory Visit | Attending: Vascular & Interventional Radiology

## 2022-11-01 DIAGNOSIS — Z466 Encounter for fitting and adjustment of urinary device: Secondary | ICD-10-CM | POA: Insufficient documentation

## 2022-11-01 DIAGNOSIS — R338 Other retention of urine: Secondary | ICD-10-CM | POA: Insufficient documentation

## 2022-11-01 DIAGNOSIS — N2889 Other specified disorders of kidney and ureter: Secondary | ICD-10-CM | POA: Insufficient documentation

## 2022-11-01 DIAGNOSIS — N1330 Unspecified hydronephrosis: Secondary | ICD-10-CM

## 2022-11-01 SURGERY — IR CHNG NEPHROSTOMY TUBE
Anesthesia: Local | Laterality: Left

## 2022-11-01 MED ORDER — IODIXANOL 320 MG/ML IV SOLN
INTRAVENOUS | Status: DC | PRN
Start: 2022-11-01 — End: 2022-11-01
  Administered 2022-11-01: 10 mL

## 2022-11-01 MED ORDER — LIDOCAINE HCL 1 % IJ SOLN
INTRAMUSCULAR | Status: DC | PRN
Start: 2022-11-01 — End: 2022-11-01
  Administered 2022-11-01: 5 mL via INTRADERMAL

## 2022-11-01 SURGICAL SUPPLY — 18 items
APPLICATOR CHLORAPREP 26ML, ~~LOC~~ (Prep Solutions) IMPLANT
BAG DRAINAGE NEPHROSTOMY 600ML (Misc Medical Supply) ×1
CAP DUAL FUNCTION RED (Misc Medical Supply) ×2 IMPLANT
CATHETER DRAIN MULTIPURPOSE MAC-LOC 8.5FR X 25CM (Lines/Drains) ×1
COVER PROBE ISOSILK 6" X 96" INTRA OPERATIVE (Drapes/towels)
DECANTER BAG-A-JET STERILE (Misc Medical Supply) ×1
DRESSING SPONGE 2X2X4 PLY (Dressings/packing) ×1 IMPLANT
DRESSING SPONGE IV 2X2 STRL (Dressings/packing) ×1 IMPLANT
DRESSING TEGADERM I.V. ADVANCED 4X4 1688 (Dressings/packing) ×1 IMPLANT
GLOVE BIOGEL PI ULTRATOUCH SIZE 6 (Gloves/Gowns) ×1 IMPLANT
GLOVE BIOGEL PI ULTRATOUCH SIZE 6.5 (Gloves/Gowns) ×1 IMPLANT
GUIDEWIRE STARTER J-CURVED FIXED CORE 0.035" X 150CM, 8CM TAPER, 15MM J TIP (Procedural wires/sheaths/catheters/balloons/dilators) IMPLANT
PREP CHLOROPREP 10.5ML 1-STEP (Prep Solutions) IMPLANT
PROCEDURE PACK - IR NON-VASCULAR (Procedure Packs/kits) ×1 IMPLANT
SOLUTION IV 0.9% NS 500ML (Non-Pharmacy Meds/Solutions) ×1
SUTURE ETHILON 2-0 18" FS (Suture) ×1 IMPLANT
TOWELS OR BLUE 4-PACK STERILE, DISPOSABLE (Drapes/towels)
WIRE BENTSON (MERIT) .035 X 150CM (Procedural wires/sheaths/catheters/balloons/dilators) ×1 IMPLANT

## 2022-11-01 NOTE — Plan of Care (Signed)
Problem: Promotion of Perioperative Health and Safety  Goal: Promotion of Health and Safety of the Perioperative Patient  Description: The patient remains safe, receives treatment appropriate to the surgical intervention and patient's physiological needs and is discharged or transferred to the appropriate level of care.    Information below is the current care plan.  Outcome: Progressing  Flowsheets (Taken 11/01/2022 1213)  Patient /Family stated Goal: "to have my tube changed"  Individualized Interventions/Recommendations #1: "to have my tube changed"

## 2022-11-01 NOTE — Discharge Instructions (Addendum)
Interventional Radiology   DISCHARGE INSTRUCTIONS FOR PROCEDURE: PERCUTANEOUS NEPHROSTOMY (PCN) DILITATION AND TUBE PLACEMENT      ACTIVITY  ? You may return to normal activity the day after the procedure.  ? No exercising or lifting heavy  objects (anything  over 10 pounds) for 72 hours  after the procedure.  ? You may remove the dressing and shower 24 hours  after the procedure, and then  redress the site daily.    PAIN MANAGEMENT  ? You may use  over-the-counter acetaminophen (TYLENOL) for minor discomfort,  if you are not  otherwise restricted from using this medication.    CARE OF THE INCISION SITE  ? Please change the dressing in 24 hours  looking for redness, swelling, or drainage.  ? Once  the initial dressing is changed, after 24 hours,  no dressing change is required for 7 days.  ? Please follow-up with your nephrologists for supplies and instructions.    WHEN TO CALL YOUR PHYSICIAN  ? Fever  (temp > 100o F) and / or chills  ? Severe pain / chest pain  ? Bleeding,  redness / drainage, or swelling at the incision site  ? Shortness of breath    MEDICATIONS  ? Please resume taking your regular  medications, unless otherwise told by your doctor.      CONTACT us:  ? During regular  business hours,  M-F 8:00 to 4:00, please call  [phone] [facility]) or [phone] [facility])  ? After-hours,  please call the [place] Page Operator at [phone] and ask for the Interventional Radiologist On-Call.Interventional Radiology   DISCHARGE INSTRUCTIONS FOR PROCEDURE: PERCUTANEOUS NEPHROSTOMY (PCN) DILITATION AND TUBE PLACEMENT      ACTIVITY  ? You may return to normal activity the day after the procedure.  ? No exercising or lifting heavy  objects (anything  over 10 pounds) for 72 hours  after the procedure.  ? You may remove the dressing and shower 24 hours  after the procedure, and then  redress the site daily.    PAIN MANAGEMENT  ? You may use  over-the-counter acetaminophen (TYLENOL) for minor discomfort,   if you are not  otherwise restricted from using this medication.    CARE OF THE INCISION SITE  ? Please change the dressing in 24 hours  looking for redness, swelling, or drainage.  ? Once  the initial dressing is changed, after 24 hours,  no dressing change is required for 7 days.  ? Please follow-up with your nephrologists for supplies and instructions.    WHEN TO CALL YOUR PHYSICIAN  ? Fever  (temp > 100o F) and / or chills  ? Severe pain / chest pain  ? Bleeding,  redness / drainage, or swelling at the incision site  ? Shortness of breath    MEDICATIONS  ? Please resume taking your regular  medications, unless otherwise told by your doctor.    PHYSICIAN PERFORMING YOUR PROCEDURE:  Dr.       ADDITIONAL INSTRUCTIONS:  Stephen [name]         CONTACT us:  ? During regular  business hours,  M-F 8:00 to 4:00, please call  [phone] [facility]) or [phone] [facility])  ? After-hours,  please call the [place] Page Operator at [phone] and ask for the Interventional Radiologist On-Call.

## 2022-11-03 ENCOUNTER — Telehealth (HOSPITAL_BASED_OUTPATIENT_CLINIC_OR_DEPARTMENT_OTHER): Payer: Self-pay

## 2022-11-03 NOTE — Telephone Encounter (Signed)
[place] / Medical Oncology Social Work Telephone note:    [name] left me a voicemail to say he received his [place] and thanked me for the help.    [name] is having his cysto stent removed next week, and he wanted to touch base with me.      Left [name] a message.  Will f/u.    [name], [place] Social Worker  [phone]

## 2022-11-06 ENCOUNTER — Encounter (HOSPITAL_BASED_OUTPATIENT_CLINIC_OR_DEPARTMENT_OTHER): Payer: Self-pay | Admitting: Urology

## 2022-11-07 ENCOUNTER — Encounter (HOSPITAL_BASED_OUTPATIENT_CLINIC_OR_DEPARTMENT_OTHER): Payer: Self-pay | Admitting: Hospital

## 2022-11-08 ENCOUNTER — Ambulatory Visit: Payer: Medicare Other | Attending: Urology | Admitting: Urology

## 2022-11-08 ENCOUNTER — Encounter (HOSPITAL_BASED_OUTPATIENT_CLINIC_OR_DEPARTMENT_OTHER): Payer: Self-pay | Admitting: Urology

## 2022-11-08 ENCOUNTER — Other Ambulatory Visit (INDEPENDENT_AMBULATORY_CARE_PROVIDER_SITE_OTHER): Payer: Medicare Other

## 2022-11-08 VITALS — BP 116/62 | HR 60 | Temp 97.0°F | Resp 17 | Wt 186.0 lb

## 2022-11-08 DIAGNOSIS — N135 Crossing vessel and stricture of ureter without hydronephrosis: Secondary | ICD-10-CM | POA: Insufficient documentation

## 2022-11-08 DIAGNOSIS — C642 Malignant neoplasm of left kidney, except renal pelvis: Secondary | ICD-10-CM | POA: Insufficient documentation

## 2022-11-08 LAB — BASIC METABOLIC PANEL, BLOOD
Anion Gap: 12 mmol/L (ref 7–15)
BUN: 19 mg/dL (ref 8–23)
Bicarbonate: 27 mmol/L (ref 22–29)
Calcium: 10 mg/dL (ref 8.5–10.6)
Chloride: 103 mmol/L (ref 98–107)
Creatinine: 1.21 mg/dL — ABNORMAL HIGH (ref 0.67–1.17)
Glucose: 90 mg/dL (ref 70–99)
Potassium: 4.1 mmol/L (ref 3.5–5.1)
Sodium: 142 mmol/L (ref 136–145)
eGFR Based on CKD-EPI 2021 Equation: >60 mL/min/{1.73_m2}

## 2022-11-08 MED ORDER — LIDOCAINE HCL 2% EX GEL (UROJET)
10.0000 mL | Freq: Once | Status: AC
Start: 2022-11-08 — End: 2022-11-08
  Administered 2022-11-08: 10 mL via URETHRAL

## 2022-11-08 MED ORDER — LIDOCAINE HCL 2% EX GEL (UROJET)
Status: AC
Start: 2022-11-08 — End: 2022-11-08
  Filled 2022-11-08: qty 10

## 2022-11-08 NOTE — Interdisciplinary (Signed)
Procedure to be Performed: Cystoscopy      Time in Room # 4  for Cystoscopy with stent removal     Patient in supine position     Prepped with Betadine using sterile technique.   2% Lidocaine inserted into the urethra, patient tolerated well         Time out Called: 0920 Dr. Sallyanne Kuster  arrive       Start Procedure: 0920  End Procedure: 0928    Antibiotics given as None given      Patient out of room: 0940

## 2022-11-08 NOTE — Patient Instructions (Signed)
PLAN:    Schedule a visit in 3 months with Dr. Sallyanne Kuster for cystoscopy   PLEASE STOP BY THE FRONT DESK TO SCHEDULE   Complete lab work [place] building, located to the left of [place]   Call to schedule imaging and complete as soon as possible   -Please allow 5 business days for the order to be processed prior to calling the PET-CT scheduler.  PET/CT or PET scheduler: [phone]    You have been referred to the Department of [place] for a clinic consult. Please call [phone] to schedule an appointment at your earliest convenience.  Insurance varies from person to person and you may also need an insurance authorization prior to being seen.     You have been referred to the Department of Interventional Radiology for a clinic consult. Please call to schedule an appointment at your earliest convenience.  Insurance varies from person to person and you may also need an insurance authorization prior to being seen.     Interventional Radiology Department scheduling phone number: [phone].      For more information on Urology, please visit:   [email or web address]    [email or web address]      Carlota Raspberry MD  Department of Urology     [facility] - Urology  Monday-Friday 8:00- 5:00p.m. (Closed Holidays and Weekends)     For any question or to schedule any appointment call [phone]    AFTER HOURS EMERGENCY NUMBER [phone]  Ask for the Urologist/Oncologist Physician On-Call.        [place] - Urology  Fax number: [phone]     Clinical Nurse  Vicente Males  Phone: [phone]  Fax: [phone]    Administrative Assistant/Surgery [place]  Phone: [phone]  Fax: [phone]    Social Worker  Agnes Lawrence, LCSW  Phone: [phone]           Patient Instructions - Cystoscopy After Care     Post-Procedure Information: For approximately 8-12 hours following the  cystoscopy, you might have to urinate more frequently than usual and you may experience burning during urination. These minor discomforts should go away within 12-24 hours. If they continue, contact your doctor.     Your urine may be slightly red following your cystoscopy, this is normal. However, if your urine is bright red or if you are passing clots, call the Urology Clinic. If you are unable to urinate, try sitting in a tub of warm water to help you relax. Urinate into the bath water.      If you are still unable to urinate, call the Urology Clinic and ask to speak to the Urology Nurse.     If you develop a fever and/or chills, you may have an infection. Continue to take any antibiotics your doctor may have prescribed. If you were discharged without a prescription for antibiotics, call your doctor immediately. Do not use an old prescription.     If you break out in a rash, you may be having an allergic reaction to a drug. Call your doctor immediately. Discontinue taking your medication until you have been advised by your doctor.    Severe pain in the lower abdomen or low back may be an indication of bladder spasms or an infection. Contact the doctor if you experience continued discomfort.    Call the clinic and ask to speak to the nurse if you experience any of the  following:  bright red bleeding or blood clots in your urine  burning or frequent urination after 24 hours   inability to urinate  fever and/or chills  severe pain in lower abdomen or low back  you break out in a rash     If you have any questions and it is after business hours of 8am to 4:30pm, please call the page operator at # [phone] and ask to speak to the provider on-call.

## 2022-11-08 NOTE — Progress Notes (Signed)
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  CT urography on 04/22/2021 confirmed a filling defect in the anterior left renal pelvis.  Chest CT has been ordered but not performed yet.  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  He tolerated the procedure well.      He received 6 instillations of gelatinous mitomycin between August 05, 2021 and September 09, 2021.    MR urogram from October 26, 2021 with mild thickening in the left renal pelvis otherwise unremarkable.    Baseline creatinine has remained pre stable at 1.32    Notably, TaHG bladder cancer on 08-19-2021; repeat on 08-26-2021 no cancer.    He received ureteroscopy and transurethral resection of bladder tumor on December 24, 2021 which was notable for small volume low-grade appearing tumors in the renal pelvis as well as a tumor in the mid ureter.  Final pathology from the bladder was notable for high-grade urothelial dysplasia.    Ureteroscopy and transurethral resection of bladder tumor on 06-01-2022 with no evidence of disease on pathology.  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    MR urogram on October 22, 2022 with Multiple osseous lesions involving the bilateral iliac crest and right acetabulum concerning for osseous metastatic disease.     Review of Systems: No TIA's or unusual headaches, no  dysphagia.  No prolonged cough. No dyspnea or chest pain on exertion.  No abdominal pain, change in bowel habits, black or bloody stools.  No urinary tract or BPH symptoms.  No new or unusual musculoskeletal symptoms.      Physical Exam:  There were no vitals filed for this visit.   Patient in no acute distress  Alert and Oriented  Normocephalic, atraumatic  Abdomen soft, non tender  No lower extremity edema    PROCEDURE: A time out was conducted immediately prior to the procedure in agreement with the patient and team, confirming the following: patient name, MRN, date of birth, consent, procedure, site, side, marking, and correct equipment/implant, if applicable.    The patient was given the risks and benefits of cystoscopy.  He was given antibiotics. He was prepped and draped in standard surgical fashion. Flexible cystoscopy was performed with a 16-French flexible cystoscope. His anterior urethra appeared normal. His prostatic urethra was enlarged. The bladder was investigated.Stent grasped and removed in tact. Prior surgical site appeared normal without any evidence of mucosal abnormalities. There is no evidence of erythema, patchy areas, or papillary lesions. Complete inspection, including retroflexion, was performed, and barbotage cytology was obtained.      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    At this point I will order complete  staging including PET CT SAN.  A referral to our colleagues in [place] will be placed.  I will also order of biopsy of the bone.

## 2022-11-09 ENCOUNTER — Telehealth (HOSPITAL_BASED_OUTPATIENT_CLINIC_OR_DEPARTMENT_OTHER): Payer: Self-pay

## 2022-11-09 ENCOUNTER — Telehealth (INDEPENDENT_AMBULATORY_CARE_PROVIDER_SITE_OTHER): Payer: Self-pay

## 2022-11-09 DIAGNOSIS — C678 Malignant neoplasm of overlapping sites of bladder: Secondary | ICD-10-CM

## 2022-11-09 NOTE — Telephone Encounter (Signed)
L/M to schedule a consult for the IR APP Clinic.  Provided the IR clinic scheduling number, [phone], for patient to call back.

## 2022-11-09 NOTE — Telephone Encounter (Signed)
[facility] MED ONC NEW PATIENT INTAKE     DATE: 11/09/2022  REFERRING MD: Dr. Sallyanne Kuster   PCP: Dr. Marguerita Beards UPDATED/INSURANCE AUTH: Yes {Y/N}     BEST CALL BACK NUMBER AND WHO: [phone]     DX: (NEW Patient or 2nd Consult) mUTUC     REQUESTED RECORDS RELATED TO REASON FOR VISIT:  -          PATHOLOGY   Y  -          LABS Y   -          IMAGING/RADIOLOGY Y   -          HAS BEEN SEEN BY A UROLOGIST? Y  - WHO? Dr. Sallyanne Kuster  - WHERE/FACILITY NAME? [facility]      [facility] MEDICAL RECORDS TO [phone]     All new patient process will be reviewed and/or triaged within 48 - 72 business hours. If you have not heard from the GU New Patient Coordinator after 72 business hours, please contact us for a status update.

## 2022-11-09 NOTE — Telephone Encounter (Signed)
Pt called back and confirmed appt change

## 2022-11-09 NOTE — Telephone Encounter (Signed)
Received message from Dr Nicole Kindred that he would like to see pt for consult.    I attempted to call pt to rsc appt from 12/13 with Dr Bridgett Larsson to see Dr Nicole Kindred 12/14 instead.    Appt switched as requested and awaiting for pt to confirm new date:      12/01/2022  2:30 PM [facility] Doreen Beam, MD

## 2022-11-09 NOTE — Telephone Encounter (Signed)
Returned call and spoke with pt.    Pt aware of referral from Dr Sallyanne Kuster.    Pt noted he is having scans at [facility] 12/9 and his appt should be after that    Pt scheduled with Dr Bridgett Larsson at [facility] for:    11/30/2022  2:00 PM [facility] Bing Neighbors, MD       Pt confirmed appt and had no other questions.  Pt declined email and he states he sees appt already on his MyChart.    Closing encounter.

## 2022-11-14 ENCOUNTER — Encounter (INDEPENDENT_AMBULATORY_CARE_PROVIDER_SITE_OTHER): Payer: Self-pay | Admitting: Orthopaedic Surgery

## 2022-11-17 ENCOUNTER — Encounter: Payer: Self-pay | Admitting: Cardiovascular Disease

## 2022-11-25 ENCOUNTER — Other Ambulatory Visit (HOSPITAL_BASED_OUTPATIENT_CLINIC_OR_DEPARTMENT_OTHER): Payer: Self-pay | Admitting: Nurse Practitioner

## 2022-11-25 DIAGNOSIS — Z981 Arthrodesis status: Secondary | ICD-10-CM

## 2022-11-26 ENCOUNTER — Ambulatory Visit
Admission: RE | Admit: 2022-11-26 | Discharge: 2022-11-26 | Disposition: A | Discharge status: Home / Self Care | Payer: Medicare Other | Attending: Nuclear Medicine | Admitting: Nuclear Medicine

## 2022-11-26 ENCOUNTER — Ambulatory Visit (HOSPITAL_BASED_OUTPATIENT_CLINIC_OR_DEPARTMENT_OTHER)
Admit: 2022-11-26 | Discharge: 2022-11-26 | Disposition: A | Discharge status: Home Health | Payer: Medicare Other | Attending: Urology | Admitting: Urology

## 2022-11-26 DIAGNOSIS — C642 Malignant neoplasm of left kidney, except renal pelvis: Secondary | ICD-10-CM | POA: Insufficient documentation

## 2022-11-26 DIAGNOSIS — Z436 Encounter for attention to other artificial openings of urinary tract: Secondary | ICD-10-CM

## 2022-11-26 MED ORDER — FLUDEOXYGLUCOSE F-18 IV KIT
7.5700 | PACK | Freq: Once | INTRAVENOUS | Status: AC
Start: 2022-11-26 — End: 2022-11-26
  Administered 2022-11-26: 7.57 via INTRAVENOUS
  Filled 2022-11-26: qty 7.57

## 2022-11-28 MED ORDER — PREGABALIN 100 MG OR CAPS
100.0000 mg | ORAL_CAPSULE | Freq: Every evening | ORAL | 2 refills | Status: DC
Start: 2022-11-28 — End: 2023-03-29

## 2022-11-30 ENCOUNTER — Telehealth (HOSPITAL_BASED_OUTPATIENT_CLINIC_OR_DEPARTMENT_OTHER): Payer: Self-pay

## 2022-11-30 ENCOUNTER — Ambulatory Visit (HOSPITAL_BASED_OUTPATIENT_CLINIC_OR_DEPARTMENT_OTHER): Payer: Medicare Other | Admitting: Student in an Organized Health Care Education/Training Program

## 2022-11-30 NOTE — Telephone Encounter (Addendum)
[place] / Medical Oncology Social Work Telephone note:    Stephen [name] called me X 2 with updates on his progress.  It sounds like he has a good grasp on the treatment plan and appreciates Dr. Victorino December interventions.  Stephen [name] presents as a solid self advocate and asks good questions.      I called him back and left a message, thanking him for updates, and he can return my call anytime.    Stephen [name] then returned my call again.  Left a message.  His phone is not charged, so it's not properly working, and he can't accept calls.      Stephen [name] reported he's been in the hospital (didn't say which one) since Saturday, 12/9 for sepsis "I dodged a bullet this time".  He sounded very tired.  He hopes to be d/c'd today, so he can see Dr. Nicole Kindred for new appt tomorrow.  Stephen [name] is very motivated to f/u with medical oncology.      This SW will research other cancer grants to assist with resources, as Stephen [name] continues to have challenges with finances.      Stephen [name] has a new pt appt with Dr. Nicole Kindred on 12/14 and return visit to Dr. Sallyanne Kuster on 03/03/23.    Will message [name], RN, from Dr. Les Pou team to let her know I am following pt.     [name], [place] Social Worker  [phone]

## 2022-11-30 NOTE — Telephone Encounter (Signed)
Left vm for pt to call back to schedule clinic consult.

## 2022-12-01 ENCOUNTER — Telehealth (HOSPITAL_BASED_OUTPATIENT_CLINIC_OR_DEPARTMENT_OTHER): Payer: Self-pay

## 2022-12-01 ENCOUNTER — Ambulatory Visit: Payer: Medicare Other | Attending: Urology | Admitting: Hematology & Oncology

## 2022-12-01 ENCOUNTER — Encounter (HOSPITAL_BASED_OUTPATIENT_CLINIC_OR_DEPARTMENT_OTHER): Payer: Self-pay | Admitting: Hematology & Oncology

## 2022-12-01 VITALS — BP 114/57 | HR 66 | Temp 96.7°F | Resp 16 | Ht 67.0 in | Wt 182.0 lb

## 2022-12-01 DIAGNOSIS — C689 Malignant neoplasm of urinary organ, unspecified: Secondary | ICD-10-CM | POA: Insufficient documentation

## 2022-12-01 DIAGNOSIS — C642 Malignant neoplasm of left kidney, except renal pelvis: Secondary | ICD-10-CM | POA: Insufficient documentation

## 2022-12-01 DIAGNOSIS — C679 Malignant neoplasm of bladder, unspecified: Secondary | ICD-10-CM | POA: Insufficient documentation

## 2022-12-01 MED ORDER — LEVOFLOXACIN 750 MG OR TABS
750.00 mg | ORAL_TABLET | Freq: Every day | ORAL | Status: DC
Start: 2022-11-30 — End: 2022-12-22

## 2022-12-01 NOTE — Interdisciplinary (Signed)
Advance Care Planning      What gives the patient's life meaning?  NA

## 2022-12-01 NOTE — Telephone Encounter (Signed)
Pt called to inform that he is keeping his appt today with Dr. Nicole Kindred for in person and does not want to do a mcvv, states he got a call about appt change. Please assist, thank you.

## 2022-12-01 NOTE — Patient Instructions (Signed)
Patient Instructions:    -- You have been referred to Interventional Radiology for a biopsy.  You may call [phone] to schedule your appointment.     --Dr. Nicole Kindred will follow up after your biopsy      Have questions about Advanced Care Planning?  PREPARE ([email or web address])       Future Appointments   Date Time Provider [place]   03/03/2023  9:00 AM Carlota Raspberry, MD [place] Resource Website:  [email or web address]    Contact Information:   Thomes Lolling, MD   Assistant Professor of [place] [facility]  Genitourinary Malignancy      Sunday Corn, M.S., PA-C  Sr. Physician Assistant    Clinical questions or concerns  Oncology Nurse Case Manager   [name] RN, BSN  T: [phone]  Department of Urology   [facility]  [place] [address]    Dr. Nicole Kindred Follow Up Appointment Assistance  Administrative Assistant   Fortunato Curling  T: [phone]  F: [phone]    Monday-Friday 8:00- 4:30p.m. (Closed Holidays and Weekends)  Please listen to message carefully for daily information.   Leave a detailed message with your name, medical record number and   telephone number. Messages are usually returned within 24 hours.     [place] Phone List     AFTER HOURS EMERGENCY NUMBER   Ask for the Oncologist Physician On-Call.                   [place] Scheduling line: [phone]  Call for information about our new location and to schedule or cancel appointments.    [facility] Scheduling line: [phone]   Call to schedule your [name], CT Scans or MRI studies.    PET Scan Scheduling line: [phone]  Call to schedule your PET scan    Prostate MRI Scheduling line: [phone] or [phone]  Call to schedule your prostate MRI.   Performed at the [facility] ([place]).    Nuclear Medicine [place] [phone]   Call to schedule your bone scan, [facility] [place]: [place]   Call to schedule, cancel, or reschedule chemotherapy appointments.  Infusion Center Hours--- Monday-Friday 7:00-7:30 p.m.; Sat-Sun 8:00-4:30pm.    [place] ([place]): [phone]  Monday-Friday 730-530 p.m.; Closed Weekends    Radiation Oncology: [phone]    Call to schedule, cancel, or reschedule radiation appointments    Social Worker  [place]: Agnes Lawrence, [place]    T: [phone]  [place]: Omar Person, LCSW  T: [phone]    Financial Counselors   [phone] at [facility]   [phone] or [phone]      [facility]  [address] [place] [address]    -You can also reach [name] by [place]. To set up your MyChart please see the following site: [email or web address]  -For cancer support services, please visit out Patient and [place] support services, please visit:  [email or web address]  -For other support from the [place], please visit: [email or web address]              Thank you for allowing [name] to participate in your care  today! We value your feedback as we strive to provide every patient with an exceptional care experience.     You may receive a patient satisfaction survey in the mail in the next few weeks. Please fill out the survey upon receipt and return it in the self-addressed envelope. Your feedback will be used to help improve the experience for all our patients at [facility].     If you want to share a positive experience you had at our facility please call We Listen at [phone]. If you have any suggestions on how we can improve our services please call the [facility] leadership team at  [phone].    For medical questions or emergencies, please call your physician's office or 911. Thank you!

## 2022-12-01 NOTE — Telephone Encounter (Signed)
Called the emergency contact # - sister. She answered and stated the patient was planning in coming in person to the 2:30 pm new consult. He was going to leave by 12:45 pm to make it on time.     She will give him the message to ask if he would prefer a video appt instead. Routing for notification.

## 2022-12-01 NOTE — Telephone Encounter (Addendum)
Received message from Dr. Nicole Kindred the patient was just discharged yesterday. Asked to check if the patient will be able to make it in today @ 2:30 pm - new consult. Called the patient and the call went to vm.     Left a message asking for a return call. Also informed pt this visit can be a video consult. Left [phone]

## 2022-12-01 NOTE — Telephone Encounter (Signed)
Clinic still needed: No  LVM to schedule procedure: Tube Check  Is time held: No

## 2022-12-01 NOTE — Progress Notes (Signed)
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    Stage: Cancer Staging   Ureter malignant neoplasm, left (CMS-HCC)  Staging form: Renal Pelvis And Ureter, AJCC 8th Edition  - Clinical: Stage IV (cTX, cN0, pM1) - Signed by Doreen Beam, MD on 12/01/2022      Oncologic History:    04/22/21  CTU: filling defect in L renal pelvis  05/05/21  Cysto with L ureteroscopy revealed a L papillary tumor, biopsied: mixed high (30%) and low grade urothelial carcinoma  06/16/21  Repeat cysto, papillary tumor: pathology low grade papillary [place]  09/xx/22  Completed induction with mitogel (6 cycles)  08/19/21  HGTa of the bladder, no muscle  08/26/21  Repeat TURBT: NED, muscle identified  10/26/21  MRU: mild thickening in the L renal pelvis  12/24/21  Cysto/L ureteroscopy: dysplasia in the bladder; no disease in L ureter  05/06/22  L PCN placed  06/01/22  TURBT and L ureteroscopy: NED, stricture in place, stent placed  09/29/22  TURBT and L ureteroscopy: NED  10/22/22  MRU: Mild diffuse thickening in the L ureter/renal pelvis. Multiple osseous lesions involving the bilateral iliac crest and right acetabulum concerning for osseous metastatic disease.   11/26/22  PET/CT: diffuse osseous FDG activity with discrete osseous lesions concerning for metastatic disease.     Interim History:  Here for initial evaluation.  Notes chronic pain in spine, pelvis, nothing new.  No weight change.  Living with his sister.    Review of Systems:   A complete ROS was performed and is negative except as documented above.     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 40 tablet 0    allopurinol (ZYLOPRIM) 100 MG tablet Take 1 tablet (100 mg) by mouth daily. 30 tablet 0    [DISCONTINUED] ascorbic acid (VITAMIN C) 500 MG tablet Take 1 tablet (500 mg) by mouth daily.      [DISCONTINUED] ciclopirox (LOPROX) 0.77 % GEL Apply topically 2 times daily.      [DISCONTINUED] docusate sodium (COLACE) 250 MG capsule Take 1 capsule (250 mg) by mouth 2 times daily. 60 capsule 0    DULoxetine (CYMBALTA) 20 MG CR capsule Take 1 capsule (20 mg) by mouth daily. 90 capsule 3    [DISCONTINUED] gabapentin (NEURONTIN) 100 MG capsule Take 1 capsule (100 mg) by mouth 3 times daily for 7 days. Start taking after your procedure is completed. 21 capsule 0    ketotifen (ALAWAY) 0.025 % ophthalmic solution Place 1 drop into both eyes 2 times daily.      levoFLOXacin (LEVAQUIN) 750 MG tablet Take 1 tablet (750 mg) by mouth daily.      [  DISCONTINUED] lidocaine (ASPERCREME) 4 % patch Apply 1 patch topically every 24 hours. Leave patch on for 12 hours, then remove for 12 hours. 30 patch 0    Loratadine (ALAVERT PO) Take 10 mg by mouth daily.      [DISCONTINUED] melatonin 5 MG tablet Take 1 tablet (5 mg) by mouth at bedtime. 30 tablet 0    Multiple Vitamins-Minerals (MULTIVITAMIN WITH MINERALS) TABS tablet Take 1 tablet by mouth daily. 30 tablet 1    naloxone (NARCAN) 4 mg/0.1 mL nasal spray For suspected opioid overdose, call 911! Then spray once in one nostril. Repeat after 3 minutes if no or minimal response using a new spray in other nostril. 2 each 0    [DISCONTINUED] Polyethyl Glycol-Propyl Glycol (SYSTANE ULTRA OP)       [DISCONTINUED] polyethylene glycol (GLYCOLAX) 17 GM/SCOOP powder Take 17 g  by mouth daily. Mix with 4 to 8 ounces of fluid (water, juice, soda, coffee, or tea) prior to administration. 255 g 0    pregabalin (LYRICA) 100 MG capsule Take 1 capsule (100 mg) by mouth at bedtime. 30 capsule 2    [DISCONTINUED] senna (SENOKOT) 8.6 MG tablet Take 1 tablet (8.6 mg) by mouth daily. 30 tablet 0    tamsulosin (FLOMAX) 0.4 MG capsule TAKE 1 CAPSULE BY MOUTH EVERY DAY 90 capsule 1    tizanidine (ZANAFLEX) 2 MG tablet Take 1 tablet (2 mg) by mouth every 8 hours as needed (spasm). (Patient taking differently: Take 2 tablets (4 mg) by mouth every 8 hours as needed (spasm).) 40 tablet 0    traZODone (DESYREL) 50 MG tablet Take 0.5 tablets (25 mg) by mouth at bedtime. 30 tablet 0    triamcinolone (KENALOG) 0.1 % cream Apply 1 Application. topically 2 times daily. Apply a thin layer as directed      [DISCONTINUED] VITAMIN D PO Take 125 mg by mouth daily.       No current facility-administered medications on file prior to visit.       Physical Examination:   12/01/22  1356   BP: (!) 114/57   Pulse: 66   Temp: 96.7 F (35.9 C)   Resp: 16   SpO2: 99%     ECOG PFS 1  General: Well-nourished and in no apparent distress???????. Walks with a cane.  HEENT: Extraocular movements intact. Moist mucous membranes without oral lesions.  Neck: No thyromegaly or lymphadenopathy.   Chest: Clear to ausculation bilaterally. No wheezes, rales or rhonchi.    Cardiac: Regular rate and rhythm without any murmurs, rubs or gallops.   Abdomen: Abd soft, nontender without any hepatosplenomegaly.   Extremities: No clubbing, cyanosis, or edema.  Skin: No rashes or lesions.   Neurological: Awake, alert and oriented x 3. Attention, language and memory are intact. Strength and sensation are grossly intact.     Labs: Following labs reviewed  Lab Results   Component Value Date    WBC 5.4 04/07/2022    RBC 4.66 04/07/2022    HGB 13.3 (L) 04/07/2022    HCT 40.7 04/07/2022    MCV 87.3 04/07/2022    MCHC 32.7 04/07/2022    RDW 15.9 (H)  04/07/2022    PLT 221 04/07/2022    MPV 10.4 04/07/2022      Lab Results   Component Value Date    BUN 19 11/08/2022    CREAT 1.21 (H) 11/08/2022    CL 103 11/08/2022    NA 142 11/08/2022    K 4.1 11/08/2022  [place] 10.0 11/08/2022    TBILI 0.17 01/19/2022    ALB 4.5 04/07/2022    TP 5.4 (L) 01/19/2022    AST 16 01/19/2022    ALK 129 01/19/2022    BICARB 27 11/08/2022    ALT 25 01/19/2022    GLU 90 11/08/2022        Assessment:  Arley Salamone is a 71 year old male with OA, HTN, MDD with L UTUC with MRI and PET/CT concerning for metastatic disease.     I personally reviewed the patient's laboratory data as noted above.     I personally reviewed the patient's imaging report and independently reviewed the images as noted in the onc history.     Today I had a long conversation with data about the diagnosis, prognosis and treatment options for upper tract urothelial carcinoma.  Unfortunately on his most recent MRI and subsequent PET scans there was significant osseous metastatic disease.  I do think that it is imperative that we get a biopsy of 1 of these osseous lesions to demonstrate this is metastatic urothelial disease.  He has never had lymph node disease your significant tumor burden therefore I do think that it is possible this may be a different etiology.  The scale I discussed with a strong suspicion is that this is metastatic carcinoma.      Because I am concerned about a new carcinoma I discussed the diagnosis, prognosis and treatment options for this.  I discussed the standard treatment option combination of chemotherapy and immunotherapy with enfortumab vedotin and pembrolizumab.  I discussed that this is associated with a 60% chance of response in the longest progression and overall survival that we have sent today.  That being said I discussed that this disease is likely incurable and treatment options generally weak people live longer happy healthier.  However there are around 10-20% of patients who  have a significant response these may actually ureter disease.    Lastly we discussed the trials.  We will be ordering the key make her trial which randomizes patients to combination for now vedotin plus pembrolizumab plus or minus a novel immunotherapy agent.      In terms of his social situation, I do think that [name] has limited resources and I advocated that he bring his sister to his next appointment when we are going over his diagnosis, prognosis and treatment options.  He will think about it.    For now we will obtain a biopsy and I will plan to see him back shortly biopsy is done.  We will plan to start his treatment for thereafter.    Plan:  - biopsy of osseous lesion, likely L iliac crest    RTC after biopsy    The plan was discussed thoroughly with the patient. All questions were answered to the best of my ability.     I spent greater than 90 minutes today reviewing patient's file, including notes, labs and imaging, interacting face to face with the patient including performing a physical exam, counseling the patient, placing orders to be done prior to next visit, coordinating care and documenting this visit in the patient's chart.    Charlett Lango. Nicole Kindred, M.D.  Assistant Professor of [employer] [place] [place]  [address]  [place] [address]

## 2022-12-02 ENCOUNTER — Encounter (INDEPENDENT_AMBULATORY_CARE_PROVIDER_SITE_OTHER): Payer: Medicare Other | Admitting: Orthopaedic Surgery

## 2022-12-02 ENCOUNTER — Telehealth (HOSPITAL_BASED_OUTPATIENT_CLINIC_OR_DEPARTMENT_OTHER): Payer: Self-pay

## 2022-12-02 NOTE — Telephone Encounter (Signed)
Oncology social work telephone call:     Primary LCSW JReynolds    Oncology Provider: Dr Nicole Kindred  Oncology Diagnosis: Urothelial carcinoma of kidney    Reason for referral: Pt left message that has been recently in hospital for sepsis, saw Dr Nicole Kindred and the news is "grim". He was hoping for a call for support next week.     Intervention:  *Brief consultation with primary LCSW who has worked extensively with patient  *Called and left message that primary LCSW is out of the office until most likely middle next week; provided access and crisis line should pt feel the need to talk over the weekend      Plan:   Social work to remain available PRN    [employer]

## 2022-12-02 NOTE — Telephone Encounter (Signed)
Patient returned phone call regarding scheduling bx consult, however patient is not interested in scheduling clinic appt, and has requested to bypass. He states he was seen yesterday by Oncology, to review his PET SCAN, and the severity of his situation has increased, and he is not willing to wait much longer. Please advise when the time permits. Thank you. -KE      Priority and Order Details    Priority Order Status Class     STAT Sent Internal referral       Collection Information    Order Questions    Question Answer Comment   Reason for consult: biopsy of osseus lesion concern for metastasis    Appointment time frame: 1st Available    Note: For additional choices see drop down.   Services Other (Please add Comment)    Other Service Comment: biopsy of osseus lesion concern for metastasis

## 2022-12-05 ENCOUNTER — Other Ambulatory Visit (HOSPITAL_BASED_OUTPATIENT_CLINIC_OR_DEPARTMENT_OTHER): Payer: Self-pay | Admitting: Vascular & Interventional Radiology

## 2022-12-05 DIAGNOSIS — C678 Malignant neoplasm of overlapping sites of bladder: Secondary | ICD-10-CM

## 2022-12-05 DIAGNOSIS — N1330 Unspecified hydronephrosis: Secondary | ICD-10-CM

## 2022-12-05 NOTE — Addendum Note (Signed)
Addended by: Barry Dienes on: 12/05/2022 09:43 AM     Modules accepted: Orders

## 2022-12-05 NOTE — Telephone Encounter (Signed)
Left iliac bone lesion PET avid         Sur1 placed

## 2022-12-06 ENCOUNTER — Encounter (INDEPENDENT_AMBULATORY_CARE_PROVIDER_SITE_OTHER): Payer: Self-pay | Admitting: Orthopaedic Surgery

## 2022-12-07 ENCOUNTER — Telehealth (HOSPITAL_BASED_OUTPATIENT_CLINIC_OR_DEPARTMENT_OTHER): Payer: Self-pay | Admitting: Hematology & Oncology

## 2022-12-07 NOTE — Telephone Encounter (Signed)
Voicemail:   Pt called to inform that Bone bx is scheduled for 12/22/21, Neph tube change on 02/01/22, requesting return call to ensure message was received.     RN returned call to pt. Informed this is appropriate timing to ensure pathology report is completed for review at visit. Has questions regarding fasting prior to procedures- advised to call IR to obtain details. Patient was appreciative of call.    Future Appointments   Date Time Provider [place]   12/22/2022 24:17 PM [place] [facility] CT [place]   01/06/2023 10:30 AM Doreen Beam, MD [facility]   01/09/2023  9:00 AM Denzil Hughes, MD, PhD [facility]   02/01/2023 10:30 AM Roni Bread ANGIO 1 [facility] IR [place]   03/03/2023  9:00 AM Carlota Raspberry, MD [facility]

## 2022-12-08 ENCOUNTER — Telehealth (HOSPITAL_BASED_OUTPATIENT_CLINIC_OR_DEPARTMENT_OTHER): Payer: Self-pay

## 2022-12-08 NOTE — Telephone Encounter (Signed)
Oncology social work telephone call:     Primary LCSW JReynolds    Oncology Provider: Dr Nicole Kindred  Oncology Diagnosis: Urothelial carcinoma of kidney    Reason for referral: Follow up call to assess pt coping. Please see this writer's note of 12/02/22 for more information      Intervention:  *Reached pt briefly. Pt stated he is disappointed in the news but is coping day to day as best as he can. He appreciates primary social worker support and would prefer to wait upon her return.       Plan:   Social work to remain available PRN. Primary social worker updated    JSaisan LCSW

## 2022-12-09 ENCOUNTER — Other Ambulatory Visit (HOSPITAL_BASED_OUTPATIENT_CLINIC_OR_DEPARTMENT_OTHER): Payer: Self-pay | Admitting: Urology

## 2022-12-09 DIAGNOSIS — R339 Retention of urine, unspecified: Secondary | ICD-10-CM

## 2022-12-09 MED ORDER — TAMSULOSIN HCL 0.4 MG PO CAPS
ORAL_CAPSULE | ORAL | 1 refills | Status: DC
Start: 2022-12-09 — End: 2023-04-24

## 2022-12-15 ENCOUNTER — Encounter (INDEPENDENT_AMBULATORY_CARE_PROVIDER_SITE_OTHER): Payer: Self-pay

## 2022-12-15 ENCOUNTER — Other Ambulatory Visit (INDEPENDENT_AMBULATORY_CARE_PROVIDER_SITE_OTHER): Payer: Medicare Other

## 2022-12-15 ENCOUNTER — Ambulatory Visit (INDEPENDENT_AMBULATORY_CARE_PROVIDER_SITE_OTHER): Payer: Medicare Other | Admitting: Physician Assistant

## 2022-12-15 VITALS — BP 99/67 | HR 73 | Temp 97.8°F | Resp 18 | Wt 182.1 lb

## 2022-12-15 DIAGNOSIS — M25511 Pain in right shoulder: Secondary | ICD-10-CM

## 2022-12-15 NOTE — Interdisciplinary (Signed)
Name/DOB verified.  Pt roomed, VS, and intake done.   Informed on plan of care and aware of delay; pt verbalized understanding.   Comfort measures offered: yes  Vitals stable: yes  Pain addressed: yes  Pt masked: yes        Patient Discharge/Education   AVS printed/explained to pt: Yes   Learner: Patient   Method: Explanation with highlights and Handout   Treatment education given: Yes   Response: Verbalizes understanding   Stable upon discharge: Yes and directed/walked to exit   Pleased w/ service and informed of delay: Yes no complaints   Water: offered, my name and location provided for any needs.   Parking Validation: 1hr-2hr Parking validation informed. Patient aware to stop by the front desk to validate ticket.

## 2022-12-15 NOTE — Progress Notes (Signed)
Chief Complaint:   Chief Complaint   Patient presents with    Shoulder Pain, Right     71 year old male patient fell on 11/26/22, injuring his right shoulder. Patient was recently in hospital for sepsis. Patient now here for evaluation of shoulder.        Case summary is as follows: Stephen [name] is a 71 year old male with chief c/o  Right shoulder pain x3 weeks.  Patient states he had a fall on the 9th due to feeling dizzy when he was sick with sepsis.  Patient states he was bending over leaning on a stool when he fell onto the stool and got his right arm/shoulder stuck in between stool another object.  Patient notes he has been having persistent pain to his upper arm and shoulder, worse with movement.  Patient states he was taking Tylenol, gabapentin with no improvement.  Denies any numbness in extremity.      Past Medical History:   Diagnosis Date    Chronic back pain     Congenital hydronephrosis     Gout     Headache     Hematuria     HTN (hypertension) 12/10/2021    Kidney disease     Kidney stones     Major depressive disorder, single episode     Polyarthropathy or polyarthritis of multiple sites     Retinal detachment     Urethral stricture      Past Surgical History:   Procedure Laterality Date    CT INSERTION OF SUPRAPUBIC CATH  09/25/2015    NEPHRECTOMY Right 1955    APPENDECTOMY      COLONOSCOPY      CYSTOSCOPY      CYSTOSCOPY W/ LASER LITHOTRIPSY      OTHER SURGICAL HISTORY      Interstim 01/29/2011    SPINE SURGERY  09/21    Lumbar-sacral fusion    TRANSURETHRAL RESECTION OF PROSTATE       Social History     Socioeconomic History    Marital status: Single   Tobacco Use    Smoking status: Never    Smokeless tobacco: Never   Vaping Use    Vaping Use: Never used   Substance and Sexual Activity    Alcohol use: No    Drug use: Not Currently    Sexual activity: Not Currently     Partners: Female   Other Topics Concern    Military Service No    Blood Transfusions Yes    Caffeine Concern No    Occupational  Exposure No    Hobby Hazards No    Sleep Concern Yes     Comment: due to my lower back discomfort and leg discomfort    Stress Concern Yes     Comment: Health/ housing/ financial    Weight Concern No    Special Diet No    Back Care Yes     Comment: Am very careful with any activity    Exercises Regularly Yes    Seat Belt Use Yes    Performs Self-Exams Yes    Bike Helmet Use No     Social Determinants of Health     Intimate Partner Violence: Low Risk  (11/07/2022)    [place] IPV     Have you ever been emotionally or physically abused by your partner or someone important to you?: No     Within the last year have you been hit, slapped, kicked or  otherwise physically hurt by someone?: No     Since you've been pregnant, have you been slapped, kicked or otherwise physically hurt by someone?: N/A     Within the last year has anyone forced you to have sexual activities?: No     Are you afraid of your spouse or partner you listed above?: No         Review of Systems -  Review of Systems   Cardiovascular:  Negative for chest pain.   Musculoskeletal:  Positive for arthralgias.   Neurological:  Negative for weakness.           Physical Examination:    12/15/22  0924   BP: 99/67   Pulse: 73   Temp: 97.8 F (36.6 C)   Resp: 18   SpO2: 98%     Vital Signs noted from Triage Page.    General:  Well appearing, no distress  Head: NCAT  Eyes: EOMI. No conjunctival injection.  Mouth:  MMM.   Nose: Without congestion  Neck:  Supple.  No lymphadenopathy.  Back: Normal alignment  Chest:  respirations are non-labored.  Heart:  Regular rate   No peripheral edema, no cyanosis. Cap refill <2 sec.  Abd: Non-distended   Extremities:  MAEx4.  Pain noted to upper humerus and right shoulder but no tenderness to palpation.  Pain is noted with active and passive range of motion.  Patient was able to touch opposing shoulder.  Able to fully range shoulder.  Neuro:  GCS 15. Normal mentation.   Skin: Warm, dry.          Results:    X-ray of right shoulder no  acute fractures or dislocations.    Patient and family (if applicable) will be notified for any significant discrepancies as formal read by Radiology will occur at a later time.       X-Ray Shoulder Complete Min 2 Views - Right    Result Date: 12/15/2022  IMPRESSION: No fracture. Focal lytic glenoid lesion with high uptake seen on recent PET is barely visible on x-ray. Mild degenerative changes.      Differential:   Rotator cuff injury, adhesive capsulitis, ligament strain, fracture      Impression:    ICD-10-CM ICD-9-CM    1. Acute pain of right shoulder  M25.511 719.41 X-Ray Shoulder Complete Min 2 Views - Right                MDM:   Patient presents for acute right shoulder pain status post fall 3 weeks ago.  Of right shoulder however notes it was painful with active and passive range of motion.  No acute tenderness on palpation patient has intact pulses and sensations.  Clinically I suspect patient may have possible soft tissue injury.  Patient will be referred to physical therapy.  Instructed supportive care, follow up with PCP.    Plan:     PT referral  RICE  F/u PCP     Follow up plans and return/ED precautions were provided.  Pt agrees with plan of care and verbalized understanding.       Portions of this encounter were used with voice recognition software. Some errors, erroneous grammar and misrepresented words may be reflected in the dictation.     Supervising physician for this visit is Dr. Roselyn Reef, MD

## 2022-12-22 ENCOUNTER — Encounter (HOSPITAL_COMMUNITY)
Admission: RE | Disposition: A | Discharge status: Home Health | Payer: Self-pay | Source: Ambulatory Visit | Attending: Surgery

## 2022-12-22 ENCOUNTER — Ambulatory Visit (HOSPITAL_BASED_OUTPATIENT_CLINIC_OR_DEPARTMENT_OTHER): Payer: Medicare Other

## 2022-12-22 ENCOUNTER — Encounter (HOSPITAL_BASED_OUTPATIENT_CLINIC_OR_DEPARTMENT_OTHER): Payer: Self-pay | Admitting: Surgery

## 2022-12-22 ENCOUNTER — Ambulatory Visit
Admission: RE | Admit: 2022-12-22 | Discharge: 2022-12-22 | Disposition: A | Discharge status: Home / Self Care | Payer: Medicare Other | Source: Ambulatory Visit | Attending: Surgery | Admitting: Surgery

## 2022-12-22 DIAGNOSIS — C678 Malignant neoplasm of overlapping sites of bladder: Secondary | ICD-10-CM

## 2022-12-22 DIAGNOSIS — C679 Malignant neoplasm of bladder, unspecified: Secondary | ICD-10-CM | POA: Insufficient documentation

## 2022-12-22 DIAGNOSIS — M898X8 Other specified disorders of bone, other site: Secondary | ICD-10-CM | POA: Insufficient documentation

## 2022-12-22 DIAGNOSIS — C7951 Secondary malignant neoplasm of bone: Secondary | ICD-10-CM

## 2022-12-22 DIAGNOSIS — C662 Malignant neoplasm of left ureter: Secondary | ICD-10-CM

## 2022-12-22 LAB — HEMOGRAM, BLOOD
Hct: 37.5 % — ABNORMAL LOW (ref 40.0–50.0)
Hgb: 12.3 gm/dL — ABNORMAL LOW (ref 13.7–17.5)
MCH: 29.3 pg (ref 26.0–32.0)
MCHC: 32.8 g/dL (ref 32.0–36.0)
MCV: 89.3 um3 (ref 79.0–95.0)
MPV: 9.7 fL (ref 9.4–12.4)
Plt Count: 200 10*3/uL (ref 140–370)
RBC: 4.2 10*6/uL — ABNORMAL LOW (ref 4.60–6.10)
RDW: 15 % — ABNORMAL HIGH (ref 12.0–14.0)
WBC: 6 10*3/uL (ref 4.0–10.0)

## 2022-12-22 LAB — PROTHROMBIN TIME, BLOOD
INR: 1
PT,Patient: 11.5 s (ref 9.7–12.5)

## 2022-12-22 SURGERY — IR CT BIOPSY BONE
Anesthesia: Moderate Sedation - by non-anesthesia staff only | Laterality: Left

## 2022-12-22 MED ORDER — HYDROMORPHONE HCL 1 MG/ML IJ SOLN
INTRAMUSCULAR | Status: AC
Start: 2022-12-22 — End: ?
  Filled 2022-12-22: qty 1

## 2022-12-22 MED ORDER — SODIUM CHLORIDE 0.9 % IV SOLN
INTRAVENOUS | Status: AC | PRN
Start: 2022-12-22 — End: 2022-12-22
  Administered 2022-12-22: 50 mL/h via INTRAVENOUS

## 2022-12-22 MED ORDER — MIDAZOLAM HCL 2 MG/2ML IJ SOLN
INTRAMUSCULAR | Status: AC
Start: 2022-12-22 — End: ?
  Filled 2022-12-22: qty 2

## 2022-12-22 MED ORDER — FENTANYL CITRATE (PF) 100 MCG/2ML IJ SOLN
INTRAMUSCULAR | Status: AC
Start: 2022-12-22 — End: ?
  Filled 2022-12-22: qty 2

## 2022-12-22 MED ORDER — LIDOCAINE HCL 1 % IJ SOLN
INTRAMUSCULAR | Status: DC | PRN
Start: 2022-12-22 — End: 2022-12-22
  Administered 2022-12-22: 5 mL via INTRADERMAL

## 2022-12-22 MED ORDER — FENTANYL CITRATE (PF) 100 MCG/2ML IJ SOLN
INTRAMUSCULAR | Status: DC | PRN
Start: 2022-12-22 — End: 2022-12-22
  Administered 2022-12-22: 50 ug via INTRAVENOUS
  Administered 2022-12-22 (×2): 25 ug via INTRAVENOUS

## 2022-12-22 MED ORDER — HYDROCODONE-ACETAMINOPHEN 5-325 MG OR TABS
1.0000 | ORAL_TABLET | ORAL | Status: DC | PRN
Start: 2022-12-22 — End: 2022-12-22

## 2022-12-22 MED ORDER — HEPARIN SODIUM LOCK FLUSH 100 UNIT/ML IJ SOLN CUSTOM
500.0000 [IU] | Freq: Once | INTRAVENOUS | Status: DC | PRN
Start: 2022-12-22 — End: 2022-12-22

## 2022-12-22 MED ORDER — ONDANSETRON HCL 4 MG/2ML IV SOLN
8.0000 mg | Freq: Three times a day (TID) | INTRAMUSCULAR | Status: DC | PRN
Start: 2022-12-22 — End: 2022-12-22

## 2022-12-22 MED ORDER — MIDAZOLAM HCL 2 MG/2ML IJ SOLN
INTRAMUSCULAR | Status: DC | PRN
Start: 2022-12-22 — End: 2022-12-22
  Administered 2022-12-22: .5 mg via INTRAVENOUS
  Administered 2022-12-22: 1 mg via INTRAVENOUS
  Administered 2022-12-22: .5 mg via INTRAVENOUS

## 2022-12-22 MED ORDER — HYDROMORPHONE HCL 1 MG/ML IJ SOLN
INTRAMUSCULAR | Status: DC | PRN
Start: 2022-12-22 — End: 2022-12-22
  Administered 2022-12-22: 1 mg via INTRAVENOUS

## 2022-12-22 MED ORDER — HYDROMORPHONE HCL 1 MG/ML IJ SOLN
0.5000 mg | INTRAMUSCULAR | Status: DC | PRN
Start: 2022-12-22 — End: 2022-12-22

## 2022-12-22 SURGICAL SUPPLY — 9 items
CONTAINER, FORMALIN, PFILLED, 10% NBF, 20ML (Misc Medical Supply) ×1
DECANTER BAG-A-JET STERILE (Misc Medical Supply) ×1
DRAPE SHEET MEDIUM STERILE 40X70IN (Drapes/towels) ×1 IMPLANT
GLOVE BIOGEL PI INDICATOR SIZE 7.5 (Gloves/Gowns) ×1
GLOVE BIOGEL PI ULTRATOUCH SIZE 6.5 (Gloves/Gowns) ×1
GLOVE BIOGEL PI ULTRATOUCH SIZE 7.5 (Gloves/Gowns) ×3 IMPLANT
PROCEDURE PACK - IR NON-VASCULAR (Procedure Packs/kits) ×1 IMPLANT
SET BIOPSY TEMNO ELITE NON-VALVED 18G X 15CM BX/5 (Needles/punch/cannula/biopsy) ×1 IMPLANT
TRAY BONE LESION BIOPSY 10/10G (Needles/punch/cannula/biopsy) ×1

## 2022-12-22 NOTE — Plan of Care (Signed)
Problem: Promotion of Perioperative Health and Safety  Goal: Promotion of Health and Safety of the Perioperative Patient  Description: The patient remains safe, receives treatment appropriate to the surgical intervention and patient's physiological needs and is discharged or transferred to the appropriate level of care.    Information below is the current care plan.  Outcome: Resolved  Flowsheets (Taken 12/22/2022 1424)  Guidelines: PACU  Individualized Interventions/Recommendations #1: Manage pain  Individualized Interventions/Recommendations #2 (if applicable): Anticipate needs

## 2022-12-22 NOTE — RN OR/Procedure Note (Signed)
INTERVENTIONAL RADIOLOGY NURSING NOTE:     A biopsy of iliac bone was performed on 12/22/22.   After specimens were obtained and labeled, they were handed off to Pathology by Ranae Plumber    Total Specimens Sent: 4

## 2022-12-22 NOTE — H&P (Signed)
HISTORY & PHYSICAL - INTERVAL ASSESSMENT    **ONLY TO BE USED IN ADDITION TO A HISTORY & PHYSICAL**        This interval assessment is required for History & Physical completed less than 30 days prior to the admission or surgery. A History & Physical completed more than 30 days prior to the admission or surgery must be repeated.    This note is based on the note by Lam, PA on 12/15/22    Current Medical Status:  Unchanged    Medications / Allergies:  Unchanged    Review of Systems:  Unchanged    Physical Examination:  I have examined the patient today.  Unchanged    Laboratory or Clinical Data:  Unchanged    Modifications of Initial Care Plan:  Unchanged    PRE-PROCEDURE RECORD FOR SEDATION    Planned procedure:  left iliac bone biopsy    Planned type of sedation:  moderate sedation.  I have discussed this procedure, its potential risks and complications, the risks associated with refusal of the procedure, and alternatives to the procedure with this patient.  Planned sedation route:  Intravenous.    The patient has consented to the procedure.    Sedation/Anesthesia History:    The patient has no history of an adverse reaction to sedation or anesthesia.    I have assessed the patient's status immediately prior to this procedure:  Yes    I have discussed pain management needs and options for the patient with the patient or caregiver:  Yes    Sedation options, risks, and plans have been discussed with the patient or caregiver.  Questions were answered.  The patient or caregiver agrees to proceed as planned.    Airway History and Assessment:      Airway Class:  Class III  Neck range of motion  ROM somewhat limited by pain    Potential airway issues?  None    ASA classification of physical status:  3.  A patient with severe systemic disease that limits activity but is not incapacitating.    Josephine Cables, MD, PhD  IR4/PGY-6  Interventional Radiology    Interventional Radiology Pagers:    Working Hours (M-F, 7 am - 5  pm)  [facility] - "Group [facility] Pager"  [facility] - "Group [facility] Pager"    After Hours ( M-F, 5 pm - 7 am and weekends)   [facility]: "Call IR Home" [phone]

## 2022-12-22 NOTE — Brief Op Note (Signed)
INTERVENTIONAL RADIOLOGY BRIEF PROCEDURE NOTE     DATE: 12/22/2022  TIME: 1:59 PM    PREPROCEDURE DIAGNOSIS: history of urothelial carcinoma and FDG-avid lesion in left iliacus    POSTPROCEDURE DIAGNOSIS: Same    PROCEDURE: CT-guided left iliac lesion bone bx    VIR ATTENDING: Cresenciano Lick, MD    ASSISTANTS(s): Josephine Cables, MD, PhD (Resident)    ANESTHESIA: Lidocaine local and Moderate sedation  Intra-op Administered Meds from 12/22/2022 1305 to 12/22/2022 1359       Date/Time Order Dose Route Action Action by Comments    12/22/2022 1342 PST fentaNYL injection 25 mcg IntraVENOUS Given Marshell Levan., RN --    12/22/2022 1340 PST fentaNYL injection 25 mcg IntraVENOUS Given Marshell Levan., RN --    12/22/2022 1332 PST fentaNYL injection 50 mcg IntraVENOUS Given Marshell Levan., RN --    12/22/2022 1335 PST HYDROmorphone (DILAUDID) injection 1 mg IntraVENOUS Given Marshell Levan., RN --    12/22/2022 1336 PST lidocaine 1% injection 5 mL IntraDERMAL Given Maryjane Hurter, MD --    12/22/2022 1342 PST midazolam (VERSED) injection 0.5 mg IntraVENOUS Given Marshell Levan., RN --    12/22/2022 1340 PST midazolam (VERSED) injection 0.5 mg IntraVENOUS Given Marshell Levan., RN --    12/22/2022 1332 PST midazolam (VERSED) injection 1 mg IntraVENOUS Given Marshell Levan., RN --    12/22/2022 1336 PST sodium chloride 0.9% infusion 50 mL/hr IntraVENOUS New Bag Melburn Hake T.M., RN --            FINDINGS: Technically successful CT-guided left iliac lesion bone bx. Please see dictated report for additional findings.    SPECIMENS:  11-gauge core needle specimens x 1 sent for pathology. 18-gauge core needle soft tissue specimens x 3 sent for pathology.    Fluids/Blood Products:      IV Fluids: None    Blood Products: None    EBL: Minimal    Urine Output: None    COMPLICATIONS: None.    DISPOSITION: To home. in stable condition.    RECOMMENDATIONS:  2hr sedation recovery      Josephine Cables, MD, PhD  PGY-6  Interventional  Radiology

## 2022-12-22 NOTE — Discharge Instructions (Signed)
INTERVENTIONAL RADIOLOGY  GENERAL POST-PROCEDURE INSTRUCTIONS      Sedation  The sedative medications used during the procedure will remain in your system for up to 24 hours.  Therefore, we would like you to take it easy for the rest of the day.  Do not drive, sign any legal documents, or drink alcohol.    Pain Management  You may use over-the-counter acetaminophen (Tylenol) for minor discomfort, if you are not otherwise restricted from using this medication.    When to Call Your Physician  Fever (temperature over 100 degrees) or chills  Severe pain or chest pain  Bleeding, redness, swelling, or drainage around the incision site  Shortness of breath    Medications  Please resume taking your regular medications, unless otherwise instructed by your doctor.    Additional Instructions (if any)    _____________________________________________________________    _____________________________________________________________    Contact Us    During regular business hours (Monday trough Friday from 8:00 AM to 4:00 PM), please call [phone] ([place]).    After hours, please call the [place] page operator at [phone] and ask for the interventional radiologist on call.    Interventional Radiology Scheduling - [phone]

## 2022-12-26 ENCOUNTER — Telehealth (HOSPITAL_BASED_OUTPATIENT_CLINIC_OR_DEPARTMENT_OTHER): Payer: Self-pay

## 2022-12-26 NOTE — Telephone Encounter (Signed)
[place] / Medical Oncology Social Work Telephone note:    Follow up call with Stephen [name] for ongoing support.  He is currently in the ED at [facility] for what sounds like bronchitis.  He plans to f/u with Dr. Nicole Kindred re: treatment planning and is looking forward to that.    Consult with [name], RN.    Will follow.    [name], [place] Social Worker  [phone]

## 2022-12-27 ENCOUNTER — Telehealth: Payer: Self-pay | Admitting: Cardiovascular Disease

## 2022-12-27 NOTE — Telephone Encounter (Signed)
Lvm to schedule 12 month f/u

## 2022-12-27 NOTE — Telephone Encounter (Signed)
-----   Message from Quentin Angst, [place] sent at 12/26/2022  4:55 PM EST ----- Regarding: Yearly follow up visit Hi,  Could you contact this patient to schedule a yearly follow up visit. The patient was last seen on 12/31/2021 by Dr. Fletcher Anon. Thank you so much.   Sherena.

## 2022-12-28 ENCOUNTER — Other Ambulatory Visit: Payer: Self-pay

## 2022-12-28 ENCOUNTER — Encounter: Payer: Self-pay | Admitting: Cardiovascular Disease

## 2022-12-28 MED ORDER — ATORVASTATIN CALCIUM 80 MG PO TABS: ORAL_TABLET | ORAL | 1 refills | Status: DC

## 2022-12-28 NOTE — Telephone Encounter (Signed)
error 

## 2022-12-29 ENCOUNTER — Encounter: Payer: Self-pay | Admitting: Cardiovascular Disease

## 2022-12-29 ENCOUNTER — Encounter (HOSPITAL_BASED_OUTPATIENT_CLINIC_OR_DEPARTMENT_OTHER): Payer: Self-pay | Admitting: Hospital

## 2022-12-29 ENCOUNTER — Encounter (HOSPITAL_BASED_OUTPATIENT_CLINIC_OR_DEPARTMENT_OTHER): Payer: Self-pay | Admitting: Hematology & Oncology

## 2022-12-29 MED ORDER — ATORVASTATIN CALCIUM 40 MG PO TABS: 40.0000 mg | ORAL_TABLET | Freq: Every day | ORAL | 3 refills | Status: DC

## 2022-12-29 NOTE — Telephone Encounter (Signed)
1/11 : Per Danielle placed a request for image from [employer]

## 2023-01-02 ENCOUNTER — Other Ambulatory Visit (HOSPITAL_COMMUNITY): Payer: Self-pay

## 2023-01-02 ENCOUNTER — Other Ambulatory Visit: Payer: Self-pay

## 2023-01-05 ENCOUNTER — Telehealth (HOSPITAL_BASED_OUTPATIENT_CLINIC_OR_DEPARTMENT_OTHER): Payer: Self-pay | Admitting: Nurse Practitioner

## 2023-01-05 DIAGNOSIS — M79601 Pain in right arm: Secondary | ICD-10-CM

## 2023-01-05 NOTE — Telephone Encounter (Signed)
Routed to provider to review and make further recommendations as needed.  -Patient experiencing severe pain in RUE s/p fall two months ago  -Seen by [facility]. XR was negative and PT advised.  -Patient has found no relief with PT, OTC pain medications, and other therapeutic measures.  -Patient requesting pain medication bridge until evaluation w/ Dr. Radene Knee Monday. Advised patient to follow up w/ PCP since new issue, different body part. Patient insisted to ask you anyway. Pended flexeril 5 mg just in case.  -Will wait until appt w/ Dr. Radene Knee to see if hand referral is needed.    ==========================================================  Reason for call: severe pain in RUE s/p fall in November    DOS:  LOV:  NOV:    Outgoing call to patient.  -Increasing pain after falling on anterior right shoulder.  -10/10 constant, deep, dull pain in right upper arm. Pain with all types of movement. Nothing in particular makes it worse. Pain also in right neck but does not think it is related.  -States there is an area on his upper right arm right below shoulder joint where is feels soft when pressing on it.  -Denies noticeable swelling or bruising. Denies burning sensation, shooting pain, N/T.  -[facility] XR was ordered but showed no acute fractures or dislocations. Plan was PT referral, RICE and f/y w/ PCP. His PT thinks he may have torn something. Has not followed up with PCP.  -Currently not doing anything for pain. States nothing works. Has tried ibuprofen and tylenol PRN, topical creams, heat/ice. Taking tizanidine and lyrica with no relief. Has not tried flexeril.  -Patient requesting pain medication from NP Bilenkaya if able to bridge until new patient appt w/ Dr. Radene Knee on Monday.  -Patient asking if any further imaging needs to be done.    Recommendations/Plan:  -Informed patient this is a new issue with a different body part and patient needs to be evaluated by a hand specialist, not spine.  Advised patient to reach out to PCP for pain management and further testing in the interim. Will ask NP Bilenkaya anyway since patient insists. Pended flexeril 5 mg.  -Also informed patient Dr. Radene Knee will likely order imaging during visit if it is indicated.  -Reinforced taking OTC pain medications on an alternating schedule and also to increase dosage but patient was not interested.    - Cat RN, Ambulatory Float Team

## 2023-01-05 NOTE — Telephone Encounter (Signed)
Stephen [name] 72 year old   ESTABLISHED PAT ONLY     Caller's Name, Relationship to patient: [name]      Reason for call (including the symptoms/concerns patient has): Pt. Calling today stating he fell back in November and is experiencing excruciating pain in right arm. Pt. States he is not able to use arm at all.    Pt. Is an established Pt. With NP Bilenkaya and requested CC agent to reach out to clinic and see if she could prescribe him something for the pain and or a muscle relaxer. Pt. States he has an appt. With Dr. Radene Knee on Monday and is planning on f/u with this then but needs something to help with the pain until then.    CC agent let Pt. Know I would reach out as a courtesy but he may be redirected to PCP/ED due to not being seen for this injury yet.         Current Pain Level (0 Lowest -10 highest) : 10      Provider Name:NP Bilenkaya      If  Patient had surgery:n/a      LOV Date and Plan: 05/26/2022  PLAN/DISCUSSION:  Discussed the findings of the MRI with the patient. Contacted Dr. Ruthine Dose , ok to proceed with MRI with contrast. Once completed patient will follow up to discuss results and if clear can proceed with possible cESI.     FOLLOWUP: Return to clinic: post MRI. The patient is encouraged to call us with any questions or problems in the interim. Our contact numbers were given to the patient.      Best time to call patient and/or [place], and best contact ph# (RN please document outbound call): [phone]      Reminder:  Symptom calls should be responded to within 24 hours by RN. If you need a more urgent callback to patient, please use RN chat group and/or page. Use messaging guidelines for red-flag symptoms. Please verify that your patient has received callback with-in the requested timeframe.

## 2023-01-05 NOTE — Telephone Encounter (Signed)
Needs an evaluation. Not sure if Flexeril is the appropriate med or needs something else since it has not been evaluated. Agree with triage

## 2023-01-06 ENCOUNTER — Encounter (INDEPENDENT_AMBULATORY_CARE_PROVIDER_SITE_OTHER): Payer: Medicare Other | Admitting: Orthopaedic Surgery

## 2023-01-06 ENCOUNTER — Ambulatory Visit: Payer: Medicare Other | Attending: Hematology & Oncology | Admitting: Hematology & Oncology

## 2023-01-06 ENCOUNTER — Other Ambulatory Visit: Admit: 2023-01-06 | Discharge: 2023-01-06 | Disposition: A | Discharge status: Home / Self Care | Payer: Medicare Other

## 2023-01-06 ENCOUNTER — Encounter (HOSPITAL_BASED_OUTPATIENT_CLINIC_OR_DEPARTMENT_OTHER): Payer: Self-pay | Admitting: Hematology & Oncology

## 2023-01-06 ENCOUNTER — Encounter (INDEPENDENT_AMBULATORY_CARE_PROVIDER_SITE_OTHER): Payer: Self-pay

## 2023-01-06 ENCOUNTER — Other Ambulatory Visit (INDEPENDENT_AMBULATORY_CARE_PROVIDER_SITE_OTHER): Payer: Medicare Other | Attending: Hematology & Oncology

## 2023-01-06 VITALS — BP 101/47 | HR 60 | Temp 98.4°F | Resp 16 | Ht 67.0 in | Wt 183.0 lb

## 2023-01-06 DIAGNOSIS — G893 Neoplasm related pain (acute) (chronic): Secondary | ICD-10-CM | POA: Insufficient documentation

## 2023-01-06 DIAGNOSIS — C679 Malignant neoplasm of bladder, unspecified: Secondary | ICD-10-CM | POA: Insufficient documentation

## 2023-01-06 LAB — CBC WITH DIFF, BLOOD
ANC-Automated: 7.7 10*3/uL — ABNORMAL HIGH (ref 1.6–7.0)
Abs Basophils: 0 10*3/uL (ref <0.2)
Abs Eosinophils: 0.1 10*3/uL (ref 0.0–0.5)
Abs Lymphs: 1.5 10*3/uL (ref 0.8–3.1)
Abs Monos: 0.6 10*3/uL (ref 0.2–0.8)
Basophils: 0 %
Eosinophils: 1 %
Hct: 36.9 % — ABNORMAL LOW (ref 40.0–50.0)
Hgb: 12 gm/dL — ABNORMAL LOW (ref 13.7–17.5)
Imm Gran Abs: 0.1 10*3/uL — ABNORMAL HIGH (ref <0.1)
Lymphocytes: 15 %
MCH: 29.2 pg (ref 26.0–32.0)
MCHC: 32.5 g/dL (ref 32.0–36.0)
MCV: 89.8 um3 (ref 79.0–95.0)
MPV: 9.8 fL (ref 9.4–12.4)
Monocytes: 6 %
Plt Count: 340 10*3/uL (ref 140–370)
RBC: 4.11 10*6/uL — ABNORMAL LOW (ref 4.60–6.10)
RDW: 14.6 % — ABNORMAL HIGH (ref 12.0–14.0)
Segs: 78 %
WBC: 9.9 10*3/uL (ref 4.0–10.0)

## 2023-01-06 LAB — COMPREHENSIVE METABOLIC PANEL, BLOOD
ALT (SGPT): 7 U/L (ref 0–41)
AST (SGOT): 13 U/L (ref 0–40)
Albumin: 4.2 g/dL (ref 3.5–5.2)
Alkaline Phos: 330 U/L — ABNORMAL HIGH (ref 40–129)
Anion Gap: 11 mmol/L (ref 7–15)
BUN: 21 mg/dL (ref 8–23)
Bicarbonate: 27 mmol/L (ref 22–29)
Bilirubin, Tot: 0.65 mg/dL (ref <1.2)
Calcium: 9.6 mg/dL (ref 8.5–10.6)
Chloride: 97 mmol/L — ABNORMAL LOW (ref 98–107)
Creatinine: 1.16 mg/dL (ref 0.67–1.17)
Glucose: 97 mg/dL (ref 70–99)
Potassium: 4.6 mmol/L (ref 3.5–5.1)
Sodium: 135 mmol/L — ABNORMAL LOW (ref 136–145)
Total Protein: 7.9 g/dL (ref 6.0–8.0)
eGFR Based on CKD-EPI 2021 Equation: >60 mL/min/{1.73_m2}

## 2023-01-06 MED ORDER — OXYCODONE HCL 5 MG OR TABS
5.00 mg | ORAL_TABLET | Freq: Four times a day (QID) | ORAL | 0 refills | Status: DC | PRN
Start: 2023-01-06 — End: 2023-01-19

## 2023-01-06 NOTE — Patient Instructions (Addendum)
Patient Instructions:    -- Schedule your next follow up appointments at the front desk on your way out, or by calling the Urology Scheduling line:  [phone].  1.)  Stephen [name] on 2/28 or 2/29   2.)  Dr. Nicole Kindred on 2/15    -- Complete labs TODAY:  CBC, CMP      --[facility] testing on 12/22/22 pathology    --You have been referred to Radiation Oncology.  Please call [phone] to schedule an appointment.    -- Dr. Nicole Kindred has referred you to the [facility].  Call [phone] to schedule an appointment    --You have been referred to Interventional Radiology for PORT PLACEMENT.  You may call [phone] to schedule your appointment.     --Oxycodone 5 mg tablets [facility].  Take 1 tablet every 6 hours as needed for severe pain.      Have questions about Advanced Care Planning?  PREPARE ([email or web address])       Future Appointments   Date Time Provider [place]   01/09/2023  9:00 AM Denzil Hughes, MD, PhD [facility]   02/01/2023 10:30 AM Roni Bread ANGIO 1 [facility] IR [place]   03/03/2023  9:00 AM Carlota Raspberry, MD [facility] [place] Resource Website:  [email or web address]    Contact Information:   Thomes Lolling, MD   Assistant Professor of [place] [facility]  Genitourinary Malignancy      Stephen [name], M.S., PA-C  Sr. Physician Assistant    Clinical questions or concerns  Oncology Nurse Case Manager   [name] RN, BSN  T: [phone]  Department of Urology   [facility]  [place], [place] [address]    Dr. Nicole Kindred Follow Up Appointment Assistance  Administrative Assistant   Fortunato Curling  T: [phone]  F: [phone]    Monday-Friday 8:00- 4:30p.m. (Closed Holidays and Weekends)  Please listen to message carefully for daily information.   Leave a detailed message with your name, medical record  number and   telephone number. Messages are usually returned within 24 hours.     [place] Phone List     AFTER HOURS EMERGENCY NUMBER   Ask for the Oncologist Physician On-Call.                   [place] Scheduling line: [phone]  Call for information about our new location and to schedule or cancel appointments.    [facility] Scheduling line: [phone]   Call to schedule your [name], CT Scans or MRI studies.    PET Scan Scheduling line: [phone]  Call to schedule your PET scan    Prostate MRI Scheduling line: [phone] or [phone]  Call to schedule your prostate MRI.   Performed at the [facility] ([place]).    Nuclear Medicine [place] [phone]   Call to schedule your bone scan, Radium [place]: [place]   Call to schedule, cancel, or reschedule chemotherapy appointments.  Infusion Center Hours--- Monday-Friday 7:00-7:30 p.m.; Sat-Sun 8:00-4:30pm.    [place] ([place]): [phone]  Monday-Friday 730-530 p.m.; Closed Weekends    Radiation Oncology: [phone]    Call to schedule, cancel, or reschedule radiation appointments  Social Worker  [place]: Agnes Lawrence, [place]    T: [phone]  [place]: Omar Person, [place]  T: [place]   [phone] at [facility]   [phone] or [phone]      [facility]  [address], [place] [address]    -You can also reach [name] by [place]. To set up your MyChart please see the following site: [email or web address]  -For cancer support services, please visit out Patient and [place] support services, please visit:  -[email or web address]  -For other support from the [facility], please visit:  [email or web address]              Thank you for allowing [name] to participate in your care today! We value your feedback as we strive to provide every patient with an exceptional care experience.     You may receive a patient satisfaction survey in the mail in the next few weeks. Please fill out the survey upon receipt and return it in the self-addressed envelope. Your feedback will be used to help improve the experience for all our patients at [place].     If you want to share a positive experience you had at our facility please call We Listen at [phone]. If you have any suggestions on how we can improve our services please call the [facility] leadership team at [phone].    For medical questions or emergencies, please call your physician's office or 911. Thank you!          Enfortumab (Padcev) + Pembrolizumab (Keytruda)     Dr. Nicole Kindred has prescribed for you Enfortumab Vedotin (Padcev) + Pembrolizumab Beryle Flock).  These are two medications, both given via an intravenous infusion. This is given in 21 day cycles.  Each cycle is broken down into 3 weeks:     Week One:  Day 1- Labs/Fast track  (OR you can have your labs drawn at any [place] up to 2 days before your scheduled infusion) --> Dr. Gracy Bruins -->Enfortumab Infusion + Keytruda Infusion (1.5-2 hours for infusion)        Week Two:  Day 8:  Labs/Fast track then Enfortumab Infusion (30 minutes for infusion)        Week Three:  REST       Take Home Medication:  Prochlorperazine (Compazine) 10 mg:  Take 1 tablet every 6 hours, as needed (for nausea/vomiting)      When to call:    Nausea or vomiting unrelieved by medication    Vomiting accompanied by inability to keep down food or fluids    Diarrhea (3 or more episodes within 24 hours)    Fever >100 F    Chills    Difficulty with eating or drinking    Unrelieved pain    Chest pain or pressure    Shortness of breath or difficulty breathing/new  persistent cough  -Rash/itching   -Abdominal pain/discomfort   -Bloody stools   -Persistent cough   -Blurry vision   -Grey stools  -Muscle weakness  -Tinging or numbness of the fingers or toes  -Neurologic changes            *Anything you are concerned about, please do not hesitate to reach out the nurse Abb Gobert at [phone]. I am available M-F 8:00 am - 4:30 pm         *After hours number to call  [phone] and ask to speak to the Oncologist on-call        Have questions about Advanced Care Planning?  PREPARE ([email or web address])

## 2023-01-06 NOTE — Interdisciplinary (Signed)
Patient  to start on Enfortumab (PADCEV) and Pembrolizamab Beryle Flock) on 1/25.      *CONSENT on file       Nursing assessment:     Pt denies complaints.  Pt able to perform activities of daily living without difficulty.  Pt declines nutrition referral at this time.    Case Management assessment:   Transportation, home environment, ability to identify and report  adverse effects, ability to engage in self care and/or provide care, willingness to participate in and adhere to the treatment plan all without barriers.     case managment assessment: Barriers to learning assessed: emotional issues, support system poor, and transportation.   Social Work Referral: Accepted. Referral placed to Agnes Lawrence for sw reason: transportation .cancer resources, and emotional support.  Pt is already established with JR SW.    Psychosocial assessment:  Wellbeing screening assessment completed. The patient's affect mood is appropriate to situation. Personal and/or social support system consists of: friends and family. Interventions: Supportive care given. Personal difficulties related to treatment addressed to patient's satisfaction.    Education:  Chemo Teach Completed  Verbal and written instructions given to patient, including materials on Enfortumab (PADCEV) and Pembrolizamab Beryle Flock), expected length of infusion visits, pancytopenia (neutropenia, anemia, and thrombocytopenia), oral care, and management of: pain, nausea, vomiting, constipation, and depression.   Chemotherapy treatment plan, pre-medications sheets and common side effects reviewed with patient. Pre and post chemotherapy regimen, associated lab test, return appointments interval during chemotherapy discussed.   Pt instructed to contact their doctor or nurse immediately if develops:  Fever > 100 F and/or chills  Unrelieved pain  Chest pain or pressure  Shortness of breath or difficulty breathing  Nausea or vomiting that is not relieved with medication  Vomiting  accompanied by inability to keep down food or fluids   Diarrhea (3 or more episodes within 24 hours)   Pt given after-hours contact information for urgent situations   Social Services and Support Group information provided.  Contact information given.   Last office visit AVS: 12/01/2022 reviewed with patient. Patient verbalized understanding of written and oral instructions.          Contact information given and reviewed.   Questions asked and answered to patient's satisfaction and understanding    Return visit on 2/1 with Sunday Corn, PA    Future Appointments   Date Time Provider [place]   01/09/2023  9:00 AM Denzil Hughes, MD, PhD [facility]   01/19/2023  1:30 PM Leeanne Rio, [place] [facility]   02/01/2023 10:30 AM [facility] ANGIO 1 [facility] IR [place]   02/02/2023  1:30 PM Doreen Beam, MD [facility]   03/03/2023  9:00 AM Carlota Raspberry, MD [facility]         NCM sent the following via secure email to [facility] pathology and [facility]:    - [place]  -Pathology report  -AVS with order  -Copy of insurance card

## 2023-01-06 NOTE — Progress Notes (Signed)
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    Stage: Cancer Staging   Ureter malignant neoplasm, left (CMS-HCC)  Staging form: Renal Pelvis And Ureter, AJCC 8th Edition  - Clinical: Stage IV (cTX, cN0, pM1) - Signed by Doreen Beam, MD on 12/01/2022      Oncologic History:    04/22/21  CTU: filling defect in L renal pelvis  05/05/21  Cysto with L ureteroscopy revealed a L papillary tumor, biopsied: mixed high (30%) and low grade urothelial carcinoma  06/16/21  Repeat cysto, papillary tumor: pathology low grade papillary [place]  09/xx/22  Completed induction with mitogel (6 cycles)  08/19/21  HGTa of the bladder, no muscle  08/26/21  Repeat TURBT: NED, muscle identified  10/26/21  MRU: mild thickening in the L renal pelvis  12/24/21  Cysto/L ureteroscopy: dysplasia in the bladder; no disease in L ureter  05/06/22  L PCN placed  06/01/22  TURBT and L ureteroscopy: NED, stricture in place, stent placed  09/29/22  TURBT and L ureteroscopy: NED  10/22/22  MRU: Mild diffuse thickening in the L ureter/renal pelvis. Multiple osseous lesions involving the bilateral iliac crest and right acetabulum concerning for osseous metastatic disease.   11/26/22  PET/CT: diffuse osseous FDG activity with discrete osseous lesions concerning for metastatic disease.  12/22/22      Interim History:  He's currently staying with his sister, but he thinks he's going to need to find a new place.  He has had ongoing R arm/shoulder pain x 2 months that is getting worse.  Ortho appt set up for Monday.  He does have transportation and food.   He also notes some L leg pain.     Review of Systems:   A complete ROS was performed and is negative except as documented above.     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 40 tablet 0    allopurinol (ZYLOPRIM) 100 MG tablet Take 1 tablet (100 mg) by mouth daily. 30 tablet 0    DULoxetine (CYMBALTA) 20 MG CR capsule Take 1 capsule (20 mg) by mouth daily. 90 capsule 3    [DISCONTINUED] gabapentin (NEURONTIN) 100 MG capsule Take 1 capsule (100 mg) by mouth 3 times daily for 7 days. Start taking after your procedure is completed. 21 capsule 0    ketotifen (ALAWAY) 0.025 % ophthalmic solution Place 1 drop into both eyes 2 times daily.      Loratadine (ALAVERT PO) Take 10 mg by mouth daily.      Multiple Vitamins-Minerals (MULTIVITAMIN WITH MINERALS) TABS tablet Take 1 tablet by mouth daily. 30 tablet 1    naloxone (NARCAN) 4 mg/0.1 mL nasal  spray For suspected opioid overdose, call 911! Then spray once in one nostril. Repeat after 3 minutes if no or minimal response using a new spray in other nostril. 2 each 0    pregabalin (LYRICA) 100 MG capsule Take 1 capsule (100 mg) by mouth at bedtime. 30 capsule 2    tamsulosin (FLOMAX) 0.4 MG capsule TAKE 1 CAPSULE BY MOUTH EVERY DAY 90 capsule 1    tizanidine (ZANAFLEX) 2 MG tablet Take 1 tablet (2 mg) by mouth every 8 hours as needed (spasm). (Patient taking differently: Take 2 tablets (4 mg) by mouth every 8 hours as needed (spasm).) 40 tablet 0    traZODone (DESYREL) 50 MG tablet Take 0.5 tablets (25 mg) by mouth at bedtime. 30 tablet 0    triamcinolone (KENALOG) 0.1 % cream Apply 1 Application. topically 2 times daily. Apply a thin layer as directed       No current  facility-administered medications on file prior to visit.       Physical Examination:   01/06/23  1002   BP: (!) 101/47   Pulse: 60   Temp: 98.4 F (36.9 C)   Resp: 16   SpO2: 99%     ECOG PFS 1  General: Well-nourished and in no apparent distress???????. Walks with a cane. Holds R arm in pain, R arm is held close to his body.   HEENT: Extraocular movements intact. Moist mucous membranes without oral lesions.  Neck: No thyromegaly or lymphadenopathy.   Chest: Clear to ausculation bilaterally. No wheezes, rales or rhonchi.    Cardiac: Regular rate and rhythm without any murmurs, rubs or gallops.   Abdomen: Abd soft, nontender without any hepatosplenomegaly.   Extremities: No clubbing, cyanosis, or edema.  Skin: No rashes or lesions.   Neurological: Awake, alert and oriented x 3. Attention, language and memory are intact. Strength and sensation are grossly intact.     Labs: Following labs reviewed  Lab Results   Component Value Date    WBC 6.0 12/22/2022    RBC 4.20 (L) 12/22/2022    HGB 12.3 (L) 12/22/2022    HCT 37.5 (L) 12/22/2022    MCV 89.3 12/22/2022    MCHC 32.8 12/22/2022    RDW 15.0 (H) 12/22/2022    PLT 200 12/22/2022    MPV 9.7 12/22/2022      Lab Results   Component Value Date    BUN 19 11/08/2022    CREAT 1.21 (H) 11/08/2022    CL 103 11/08/2022    NA 142 11/08/2022    K 4.1 11/08/2022    [place] 10.0 11/08/2022    TBILI 0.17 01/19/2022    ALB 4.5 04/07/2022    TP 5.4 (L) 01/19/2022    AST 16 01/19/2022    ALK 129 01/19/2022    BICARB 27 11/08/2022    ALT 25 01/19/2022    GLU 90 11/08/2022        Assessment:  Stephen [name] is a 72 year old male with OA, HTN, MDD with L UTUC with metastatic disease [id number]), with osseous mets (Pet/CT with diffuse enhancement and discrete lesions).     I personally reviewed the patient's laboratory data as noted above.     I personally reviewed the patient's imaging report and independently reviewed the images as noted in the onc history.     Today I had a prolonged  conversation about the diagnosis, prognosis and treatment options for advanced urothelial carcinoma.  We discussed that this disease is  incurable and treatment is aimed at making people live longer, happier, healthier lives with a  small percentage of patients having prolonged good outcomes. I have recommended NGS testing on tissue and blood which we would also recommend.  We discussed prognosis on the order of 2 years, with some people doing much better, while others may not do as well.  We discussed that prognosis is largely based on whether their disease responds to therapy.  We discussed SOC approach which would be to start with combination enfortumab vedotin and pembrolizumab.  We discussed this regimen is given 2 weeks on, 1 week off.  We discussed the course of therapy and that this treatment is generally indefinite, though the EV is usually stopped after 6-9 cycles.  We discussed ORR of ~60-70%.  We discussed side effects of each therapy, and I highlighted risk of rash, neuropathy, diarrhea, anorexia, dry eyes/keratitis and IO-mediated side effects.    He is having significant pain in his R arm.  He has a metastatic lesion in his R glenoid fossa which I think is causing this.  He is also complaining of pain in his distal humerus.  We will obtain Xrs today.     Advanced Urothelial Carcinoma, [id number]  - Labs today - CBC/CMP  - NGS on tissue - bone biopsy  - Port placement   - Treatment plan:  --- Enfortumab vedotin 1.25 mg/kg D1, D8 Q21d  --- Pembrolizumab 200 mg D1 Q21d  --- prn anti-nausea meds  --- prn antidiarrheal medications  --- prn topical skin care  --- ED warnings   - start treatment next week  - RTC 2 weeks with Reena prior to D8  - RTC 4 weeks with me prior to C2  - consider adding zometa given significnt bone disease    R shoulder pain  - XR R arm, R shoulder   - 2/2 metastatic lesion  - referral for XRT  - palliative care referral  - offered oxycodone but he was not interested in this  - he will  see orthopediics on Monday for this    RTC after biopsy    The plan was discussed thoroughly with the patient. All questions were answered to the best of my ability.     I spent greater than 90 minutes today reviewing patient's file, including notes, labs and imaging, interacting face to face with the patient including performing a physical exam, counseling the patient, placing orders to be done prior to next visit, coordinating care and documenting this visit in the patient's chart.    Charlett Lango. Nicole Kindred, M.D.  Assistant Professor of [employer] [place], [place]  [place], [place] [id number]       Advance Care Planning    With their permission, I had a discussion with Stephen [name] today regarding their disease, metastatic urothelial carcinoma.  Additional people present at the time of discussion:  advanced urothelial carcinoma.      The goal of treatment is non-curative.  Anticipated cancer trajectory shared with the patient: advanced urothelial carcinoma. Prognosis on average is 1-2 years, however about 10% of patients may do very well for years.     Patient's Health Care Agent: sister Woodroe Chen,      Total time spent face to face with patient and/or surrogate in Advance Care Planning: 20 min.

## 2023-01-06 NOTE — Telephone Encounter (Signed)
Called patient. No answer. Advised NP Cristal Ford advises an evaluation. Left voicemail voicemail to call [facility] so he can follow up with shoulder specialists.     Call Center- Please schedule with hand app for follow up new shoulder pain.   He has Medicare A /B

## 2023-01-08 ENCOUNTER — Encounter (HOSPITAL_BASED_OUTPATIENT_CLINIC_OR_DEPARTMENT_OTHER): Payer: Self-pay | Admitting: Hematology & Oncology

## 2023-01-09 ENCOUNTER — Ambulatory Visit (HOSPITAL_BASED_OUTPATIENT_CLINIC_OR_DEPARTMENT_OTHER): Admit: 2023-01-09 | Discharge: 2023-01-09 | Disposition: A | Discharge status: Home Health | Payer: Medicare Other

## 2023-01-09 ENCOUNTER — Ambulatory Visit: Payer: Medicare Other | Attending: Orthopaedic Surgery | Admitting: Orthopaedic Surgery

## 2023-01-09 ENCOUNTER — Encounter (HOSPITAL_BASED_OUTPATIENT_CLINIC_OR_DEPARTMENT_OTHER): Payer: Self-pay | Admitting: Orthopaedic Surgery

## 2023-01-09 ENCOUNTER — Other Ambulatory Visit (HOSPITAL_BASED_OUTPATIENT_CLINIC_OR_DEPARTMENT_OTHER): Payer: Self-pay | Admitting: Vascular & Interventional Radiology

## 2023-01-09 VITALS — BP 112/49 | HR 55 | Temp 98.5°F | Resp 16

## 2023-01-09 DIAGNOSIS — M25551 Pain in right hip: Secondary | ICD-10-CM

## 2023-01-09 DIAGNOSIS — C642 Malignant neoplasm of left kidney, except renal pelvis: Secondary | ICD-10-CM

## 2023-01-09 DIAGNOSIS — Z981 Arthrodesis status: Secondary | ICD-10-CM

## 2023-01-09 DIAGNOSIS — M533 Sacrococcygeal disorders, not elsewhere classified: Secondary | ICD-10-CM | POA: Insufficient documentation

## 2023-01-09 DIAGNOSIS — M47812 Spondylosis without myelopathy or radiculopathy, cervical region: Secondary | ICD-10-CM

## 2023-01-09 DIAGNOSIS — G8929 Other chronic pain: Secondary | ICD-10-CM

## 2023-01-09 DIAGNOSIS — C679 Malignant neoplasm of bladder, unspecified: Secondary | ICD-10-CM

## 2023-01-09 NOTE — Telephone Encounter (Signed)
Per CC agent Estill Bamberg message below. Patient was scheduled to see Dr. Radene Knee today 01/22 and wanted medication to relieve pain until appointment. No further action needed from ortho scheduling at this time.

## 2023-01-09 NOTE — Progress Notes (Signed)
Attending Note:   Self referral for Orthopaedic Surgical consultation for the patient Stephen [name] for problems related to had concerns including New Patient (Right Shoulder Pain ).     My report follows:    Subjective:   I reviewed the history and medical record, and interviewed and examined the patient.    History of present illness (HPI): 72 year old male here for Orthopaedic Surgical evaluation to treat his pain and functional impairment. I introduced myself and my profession as a Sports Medicine non-surgeon here in the Orthopaedic Surgery department.    The patient is a patient established with the [facility] (Dr [name]), previously seen here in Ortho Spine surgery by Dr Zlomislic in [id number]. The patient is scheduled for a problem with the right hip. However he denies that. He comes today with a complaint of right shoulder pain.    He fell two months ago, landing on the right shoulder. Pain deep in the right shoulder (top of the shoulder, slightly behind, and front of the shoulder, axillary and in the upper arm), rated "20/10", with limited mobility. Uses the left hand to help mobilize the right arm. Oncology following, performed a PET scan, diagnosed lesion, MEDS percocet not helpful. Xray done 11-2022 as well. Pain is constant, not worse at night. Nonradiating pain.    Oncology note 01-06-2023:  Interim History:  He's currently staying with his sister, but he thinks he's going to need to find a new place.  He has had ongoing R arm/shoulder pain x 2 months that is getting worse.  Ortho appt set up for Monday.  He does have transportation and food.   He also notes some L leg pain.     R shoulder pain  - XR R arm, R shoulder   - 2/2 metastatic lesion  - referral for XRT  - palliative care referral  - offered oxycodone but he was not interested in this  - he will see orthopediics on Monday for this      Previously, he was treated inpatient at [facility] 12-26-2022 - 12-27-2022 for pneumonia:  Admission Date:  12/26/2022     Discharge Date: 12/27/2022     DISCHARGE DIAGNOSES   Principal Problem:    Pneumonia due to infectious organism, unspecified laterality, unspecified part of lung       Per 12-15-2022 express care note:     Expand All Collapse All    Chief Complaint:        Chief Complaint   Patient presents with    Shoulder Pain, Right       72 year old male patient fell on 11/26/22, injuring his right shoulder. Patient was recently in hospital for sepsis. Patient now here for evaluation of shoulder.        Case summary is as follows: Stephen [name] is a 72 year old male with chief c/o  Right shoulder pain x3 weeks.  Patient states he had a fall on the 9th due to feeling dizzy when he was sick with sepsis.  Patient states he was bending over leaning on a stool when he fell onto the stool and got his right arm/shoulder stuck in between stool another object.  Patient notes he has been having persistent pain to his upper arm and shoulder, worse with movement.  Patient states he was taking Tylenol, gabapentin with no improvement.  Denies any numbness in extremity.      Impression:      ICD-10-CM ICD-9-CM  1. Acute pain of right shoulder  M25.511 719.41 X-Ray Shoulder Complete Min 2 Views - Right            MDM:   Patient presents for acute right shoulder pain status post fall 3 weeks ago.  Of right shoulder however notes it was painful with active and passive range of motion.  No acute tenderness on palpation patient has intact pulses and sensations.  Clinically I suspect patient may have possible soft tissue injury.  Patient will be referred to physical therapy.  Instructed supportive care, follow up with PCP.     Plan:      PT referral  [facility]  F/u PCP         I reviewed the past medical history/problem list, allergies, medications, family history and social history from the [facility]   Patient Active Problem List   Diagnosis    Post-traumatic stricture of anterior urethra    Bladder neck contracture     Lumbar radiculopathy    Cervical radiculopathy    Incomplete bladder emptying    Lumbosacral spondylosis without myelopathy    Chronic SI joint pain    Degenerative spondylolisthesis    Lumbar stenosis with neurogenic claudication    Pre-procedure lab exam    Cervical spondylosis without myelopathy    Cervical spondylosis    S/P lumbar fusion    Chronic left SI joint pain    Chronic midline thoracic back pain    Spondylolisthesis at L5-S1 level    Left renal mass    Malignant neoplasm of urinary bladder, unspecified site (CMS-HCC)    Dorsopathy    Ureter malignant neoplasm, left (CMS-HCC)    Cauda equina compression (CMS-HCC)    COVID-19 virus infection    Community acquired pneumonia    Acute urinary retention    HTN (hypertension)    Gout    MDD (major depressive disorder)    Urothelial cancer (CMS-HCC)    Urinary tract infection associated with indwelling urethral catheter (CMS-HCC)    Ureteral obstruction    Hydronephrosis    Urothelial carcinoma of kidney, left (CMS-HCC)    Myalgia, other site    Trigger point with back pain    Pre-op evaluation    Malignant neoplasm of overlapping sites of bladder (CMS-HCC)     Allergies   Allergen Reactions    Sulfa Drugs Unspecified     Current Outpatient Medications   Medication Sig Dispense Refill    acetaminophen (TYLENOL) 500 MG tablet Take 2 tablets (1,000 mg) by mouth every 8 hours as needed for Mild Pain (Pain Score 1-3) or Moderate Pain (Pain Score 4-6). 40 tablet 0    allopurinol (ZYLOPRIM) 100 MG tablet Take 1 tablet (100 mg) by mouth daily. 30 tablet 0    DULoxetine (CYMBALTA) 20 MG CR capsule Take 1 capsule (20 mg) by mouth daily. 90 capsule 3    ketotifen (ALAWAY) 0.025 % ophthalmic solution Place 1 drop into both eyes 2 times daily.      Loratadine (ALAVERT PO) Take 10 mg by mouth daily.      Multiple Vitamins-Minerals (MULTIVITAMIN WITH MINERALS) TABS tablet Take 1 tablet by mouth daily. 30 tablet 1    naloxone (NARCAN) 4 mg/0.1 mL nasal spray For suspected  opioid overdose, call 911! Then spray once in one nostril. Repeat after 3 minutes if no or minimal response using a new spray in other nostril. 2 each 0    oxyCODONE (ROXICODONE) 5 MG immediate release  tablet Take 1 tablet (5 mg) by mouth every 6 hours as needed for Severe Pain (Pain Score 7-10). 30 tablet 0    pregabalin (LYRICA) 100 MG capsule Take 1 capsule (100 mg) by mouth at bedtime. 30 capsule 2    tamsulosin (FLOMAX) 0.4 MG capsule TAKE 1 CAPSULE BY MOUTH EVERY DAY 90 capsule 1    tizanidine (ZANAFLEX) 2 MG tablet Take 1 tablet (2 mg) by mouth every 8 hours as needed (spasm). (Patient taking differently: Take 2 tablets (4 mg) by mouth every 8 hours as needed (spasm).) 40 tablet 0    traZODone (DESYREL) 50 MG tablet Take 0.5 tablets (25 mg) by mouth at bedtime. 30 tablet 0    triamcinolone (KENALOG) 0.1 % cream Apply 1 Application. topically 2 times daily. Apply a thin layer as directed       No current facility-administered medications for this visit.     Family History   Adopted: Yes   Family history unknown: Yes   No hereditary or high-risk disease identified otherwise.    Social History     Socioeconomic History    Marital status: Single   Tobacco Use    Smoking status: Never     Passive exposure: Never    Smokeless tobacco: Never   Vaping Use    Vaping Use: Never used   Substance and Sexual Activity    Alcohol use: No    Drug use: Not Currently    Sexual activity: Not Currently     Partners: Female   Other Topics Concern    Military Service No    Blood Transfusions Yes    Caffeine Concern No    Occupational Exposure No    Hobby Hazards No    Sleep Concern Yes     Comment: due to my lower back discomfort and leg discomfort    Stress Concern Yes     Comment: Health/ housing/ financial    Weight Concern No    Special Diet No    Back Care Yes     Comment: Am very careful with any activity    Exercises Regularly Yes    Seat Belt Use Yes    Performs Self-Exams Yes    Bike Helmet Use No     Social Determinants  of Health     Intimate Partner Violence: Low Risk  (01/11/2022)    [place] IPV     Have you ever been emotionally or physically abused by your partner or someone important to you?: No     Within the last year have you been hit, slapped, kicked or otherwise physically hurt by someone?: No     Since you've been pregnant, have you been slapped, kicked or otherwise physically hurt by someone?: N/A     Within the last year has anyone forced you to have sexual activities?: No     Are you afraid of your spouse or partner you listed above?: No       Review of Systems: as indicated in the history of present illness and per the intake form. All others reviewed and were negative.    Objective:   Vitals pain   BP (!) 112/49 (BP Location: Left arm, BP Patient Position: Sitting, BP cuff size: Large)   Pulse 55   Temp 98.5 F (36.9 C) (Temporal)   Resp 16   SpO2 97%   Gen WNWD, Pulm No SOB, he does have an occasional dry cough, Abd no discomfort, CV 1+ pulses radial  MS cooperative and pleasant  HEENT pupils not pinprick  Neuro normal hoffmans, normal DTRs, full strength (grip/WE/EE/EF/ShAbd), normal finger coordinated movements    C spine with mild forward head posture, limitations in ROM also with mild pain.    MSK    Left shoulder forward flexion active with restrictions at 60 degrees, able to get to 90 degrees  Right shoulder forward flexion active to a bit past 30 degrees, passively to past 60 degrees. Passive IR/ER without additional pain.    Studies    PET/CT  11-27-2022  MUSCULOSKELETAL:  Bones: There is diffuse marrow heterogeneity and hypermetabolic activity throughout the axial and proximal appendicular skeleton. However, superimposed on this there are multiple discretely hypermetabolic osseous lesions. Representative lesions include:  -right scapula near the glenohumeral joint with SUV max 8.0 (CT image 105).        X-RAY SHOULDER COMPLETE MIN 2 VIEWS - RIGHT     CLINICAL HISTORY:  Right shoulder pain. History of  urothelial carcinoma.     COMPARISON:  11/26/2022.     FINDINGS:  Right shoulder 4 images     No fracture or malalignment is present. Recent PET shows increased uptake at the right glenoid without cortical destruction. The underlying lesion is not clearly visible on x-ray. There is a subtle area of lucency seen on the axillary view corresponding to the lesion with irregularity of the anterior glenoid wall, not well assessed.     Acromioclavicular and glenohumeral alignment are normal. There is mild acromioclavicular and glenohumeral arthrosis. There are a few degenerative enthesopathic cysts at the greater tuberosity. The humeral/acromial distance is normal. The para-articular soft tissues are normal.        Signed by: Delsa Grana 12/15/2022 09:54:48  IMPRESSION:  IMPRESSION:  No fracture. Focal lytic glenoid lesion with high uptake seen on recent PET is barely visible on x-ray. Mild degenerative changes.            Assessment and plan:     Right shoulder pain x 2 mos, metastatic lesion    Health conditions with potential relevance to the chief complaint (but not necessarily acted upon today):  Problem List Items Addressed This Visit       Chronic SI joint pain    Relevant Orders    X-Ray Lumbosacral Spine 2 Or 3 Views    X-Ray Hip Unilateral With Pelvis 2 or 3 Views - Right    Cervical spondylosis    Relevant Orders    X-Ray Lumbosacral Spine 2 Or 3 Views    S/P lumbar fusion    Relevant Orders    X-Ray Lumbosacral Spine 2 Or 3 Views    X-Ray Hip Unilateral With Pelvis 2 or 3 Views - Right    Urothelial carcinoma of kidney, left (CMS-HCC)    Relevant Orders    X-Ray Lumbosacral Spine 2 Or 3 Views    X-Ray Hip Unilateral With Pelvis 2 or 3 Views - Right     Other Visit Diagnoses       Right hip pain    -  Primary    Relevant Orders    X-Ray Lumbosacral Spine 2 Or 3 Views    X-Ray Hip Unilateral With Pelvis 2 or 3 Views - Right            This patient does not have a condition amenable to Orthopaedic surgery at this  time. In the meanwhile, here are management suggestions we discussed:    1. Agree with radiation treatment  2. Medication recommendations: The [facility] Department is a consultation service with regard to medications, focussed on the treatment of acute surgical and peri-operative pain. We do not take over the writing of routine prescriptions for patients. We are happy to make initial recommendations regarding pain medications for the patient's PCP to consider implementing/continuing.     - no further recommendations  - follow up with your Urologist and Pain management    3. Follow up as needed with Orthopaedic Surgery      4. Who to call if you have questions? Call our [facility] office at [phone].      I spent 30 minutes caring for this patient, including preparing to see the patient, obtaining and reviewing the history, seeing the patient and performing a medically appropriate examination, reviewing diagnostic tests and labs, ordering tests/medications or procedures, providing education, counseling and coordination of care, and documenting clinic information in the record. All questions were answered and Bulmaro Feagans understood and was satisfied with this plan.    We discussed issues as described above. All of the patient's questions were answered to their satisfaction.    Macon Large, M.D., Ph.D.  Clinical Professor, [place] Department of Orthopaedic Surgery  Board Certified, Sports Medicine  Chief, Physical Medicine and [employer] rowing reg[employer], Chief Health Officer    [email or web address]    CC: Laurine Blazer

## 2023-01-09 NOTE — Telephone Encounter (Signed)
Inbound call to schedule port placement at [facility] location 02/13/2023 at 0900 am. Patient prepped NPO 8 hours, needs a driver, and allow 3-4 hours for visit. Appointment scheduled and confirmed with patient. Patient aware of 0800 am check-in time, location, and prep.

## 2023-01-09 NOTE — Patient Instructions (Addendum)
It was a pleasure seeing you today. Today, we discussed:     Right shoulder pain, metastatic lesion    1. Agree with radiation treatment    2. Medication recommendations: The [facility] is a consultation service with regard to medications, focussed on the treatment of acute surgical and peri-operative pain. We do not take over the writing of routine prescriptions for patients. We are happy to make initial recommendations regarding pain medications for the patient's PCP to consider implementing/continuing.     - no further recommendations  - follow up with your Urologist and Pain management    3. Follow up as needed with Orthopaedic Surgery      4. Who to call if you have questions? Call our [facility] office at [phone].      Take care!  Macon Large, MD PhD

## 2023-01-11 ENCOUNTER — Telehealth (HOSPITAL_BASED_OUTPATIENT_CLINIC_OR_DEPARTMENT_OTHER): Payer: Self-pay

## 2023-01-11 ENCOUNTER — Telehealth (HOSPITAL_BASED_OUTPATIENT_CLINIC_OR_DEPARTMENT_OTHER): Payer: Self-pay | Admitting: Hematology & Oncology

## 2023-01-11 NOTE — Telephone Encounter (Signed)
[facility] / Medical Oncology Social Work Telephone note:    Initial information/Reason for referral:  Stephen [name] is a 72 year old male, who is followed by Dr. Nicole Kindred for bladder cancer.  Pt is well known to this LCSW from the past while in treatment with Dr. Sallyanne Kuster.      Ongoing social work intervention requested by [name], RN to assist with support and resources, now that Stephen [name] has metastatic disease and will be followed closely by Med/Onc.  Stephen [name] is scheduled to have a port placed on 2/26 to start his infusions.    Stephen [name] was also referred to our GU navigator, [name], and she has made contact with him (see Linda's phone encounter.)    Interview data:  I spoke with Stephen [name] for 15 minutes by phone.  He was open and receptive, easily engaged in the social work interview.  He expressed appropriate concern about his metastatic disease, especially now with his shoulder pain "that's where the cancer is.  All this time, that's what was causing the pain (in his shoulder)".      Stephen [name] voiced appropriate understanding of the treatment plan; he did benefit from some coaching, e.g. reminders of appt dates and times and clarification of the role of Palliative Care (PC).  He is looking forward to his PC team visit and is focused on help with pain control.  I reiterated several times that [facility] team looks at the "whole person" and is not focused only on pain control, per se. He eventually v/u.    Stephen [name] uses MyChart effectively and can track his appts when he is focused (he was on the bus while we were talking).  Stephen [name] also calls his insurance plan to arrange for his own transportation. Therefore, he needs a few days' notice before appts are scheduled.      Assessment/Plan:  Stephen [name] is a single male with limited support and resources, however, he is resilient and responsible and follows up as appropriate.  He is a capable self advocate, and while unconventional in his approach, he gets things  done and compliance is very good.  Stephen [name] can be tangential but is easily redirected.  He expresses sincere appreciation for our team: MD, PA, RN, LCSW, and navigator.      Consult with GU nursing and navigation.  Epic message sent to Palliative care MD and LCSW, for informational purposes only.   Stephen [name] is more than welcome to contact me anytime.  Social work to follow.    [name], [place] Social Worker  [phone]

## 2023-01-11 NOTE — Telephone Encounter (Unsigned)
Per request of NCM Danielle J for Dr. Nicole Kindred to reach out to patient and assist with getting appointments/referrals organized and scheduled.   1.Rad/Onc  2. Palliative Care  3. IR re: port placement    Spoke to patient and he has already scheduled appointments for referrals. He will see Rad/Onc on 01/17/23 in [place]. The other appointments are scheduled and visible in Epic. Patient has spoken to Palliative Care and is waiting for a call back to schedule.     He had just come in from walking his sister's chihuahua "Lorre Nick" He did write down my contact info and we did review his scheduled appointments. Patient is using a Public affairs consultant and is also active on MyChart. He is very proactive and has also scheduled his own transportation. I reassured patient that I am here to help him and to call me even if just to review his scheduled appointments. He is very appreciative of call and we plan to keep in touch.  He also confirms his excellent care from [employer].     Routing to [employer] NCM and [employer] as Juluis Rainier.

## 2023-01-12 ENCOUNTER — Encounter (HOSPITAL_BASED_OUTPATIENT_CLINIC_OR_DEPARTMENT_OTHER): Payer: Self-pay

## 2023-01-17 ENCOUNTER — Encounter (HOSPITAL_BASED_OUTPATIENT_CLINIC_OR_DEPARTMENT_OTHER): Payer: Self-pay | Admitting: Radiation Oncology

## 2023-01-17 NOTE — Progress Notes (Signed)
Date of Consult:  01/17/2023     ICD-10:  C79.51 - Secondary malignant neoplasm of bone     All Diagnoses:   Primary, c79.51 - secondary malignant neoplasm of bone, Diagnosed 01/16/2023 (active)  .    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    Reason for Consultation:  cancer metastatic to right shoulder    This consultation has been personally requested in writing by Thomes Lolling to evaluate the possible use of radiation therapy to treat Stephen [name].  A copy of this consultation has been made available to the requesting physician to aid in the medical decision making process for the care of this patient.    History of Present Illness:  This history was obtained after reviewing the patient's electronic health record and speaking with the patient.     04/22/21  CTU: filling defect in L renal pelvis   05/05/21  Cysto with L ureteroscopy revealed a L papillary tumor, biopsied: mixed high (30%) and low grade urothelial carcinoma   06/16/21  Repeat cysto, papillary tumor: pathology low grade papillary [place]   09/xx/22  Completed induction with mitogel (6 cycles)   08/19/21  HGTa of the bladder, no muscle   08/26/21  Repeat TURBT: NED, muscle identified   10/26/21  MRU: mild thickening in the L renal pelvis   12/24/21  Cysto/L ureteroscopy: dysplasia in the bladder; no disease in L ureter   05/06/22  L PCN placed   06/01/22  TURBT and L ureteroscopy: NED, stricture in place, stent placed   09/29/22  TURBT and L ureteroscopy: NED   10/22/22  MRU: Mild diffuse thickening in the L ureter/renal pelvis. Multiple osseous lesions involving the bilateral iliac crest and right acetabulum concerning for osseous metastatic disease.    11/26/22  PET/CT: diffuse osseous FDG activity with discrete osseous lesions concerning for metastatic disease.   12/22/22  Left iliac crest biopsy -   FINAL PATHOLOGIC DIAGNOSIS:   A: Left iliac crest, biopsy:   -  Metastatic urothelial carcinoma.    Patient was recently seen by medical oncology, Dr. Nicole Kindred, who recommends enfortumab vedotin and pembrolizumab. Patient will start this soon. He was seen by orthopedics re: right shoulder metastasis, no surgical intervention indicated.     He presents today for radiation oncology consultation.  Today, he complains of severe pain in the right shoulder which radiates down the right arm. He can barely use the right arm due to this pain. Patient states pain is "20 out of 10". Patient is former Marine scientist. Patient was offered oxycodone by Dr. Nicole Kindred but he declined this. He has pending consult with palliative care next week.        Past Medical History:  Chronic hydronephrosis, gout, hypertension, major depressive disorder, Malignant neoplasm of overlapping sites of bladder, polyarthritis, Retinal detachment, Ureter maligant neoplasm, left, Urethral stricture, Urothelial carcinoma of left kidney.    Past Surgical History:  Appendectomy, colonoscopy, cystoscopy w/ laser lithotripsy, lumbar- sacral fusion, nephrectomy, right and transurethral resectionof prostate.     Medications:  [place]  Allopurinol  Cymbalta  Flomax  Lyrica  Oxycodone HCl  Tamsulosin HCl  TraZODone HCl     Allergies:  Sulfa drugs     Family History:  Patient is adopted, family history is unknown     Social History:  Last screened on 01/16/2023 - Never smoked. Last screened on 01/16/2023 - Never drank.     Review of Systems:  Allergic/Immunologic Complains of allergies. Denies adverse reactions.   Cardiovascular Denies arrhythmias, pacemaker, shortness of breath, edema, orthopnea and palpitations.   Constitutional Complains of fatigue. Denies lack of appetite, fever, lethargy, malaise, night sweats, rigors / chills and change in weight.   Endocrine Denies diabetes, hot flashes, menstrual irregularities and thyroid disease.   Head & Neck Denies dysphagia, ear pain, neck masses, blurred vision, problems with hearing, mouth  dryness, double vision, otitis, glaucoma, sputum production, hoarseness, visual acuity and tinnitus.   Gastrointestinal Complains of pain / cramping. Denies abdominal pain, change in bowel habits, constipation, diarrhea, heartburn / dyspepsia, vomiting up blood, blood in stools, hemorrhoids, melena / GI bleeding, nausea, satiety and vomiting.   ROS Genitourinary (M) Denies dysuria, frequency, hematuria, impotence, incontinence, nocturia, renal stone disease, retrograde ejaculation, scrotal swelling, urgency and urine color change.   Hematologic/Lymphatic Denies anemia and lymphedema.   Skin Complains of history of skin cancer. Denies blistering, bruising, dry skin, facial burning, nail changes, photosensitivity, pruritus, rash and urticaria.   Musculoskeletal Complains of arthritis, bone pain, joint pain and decreased range of motion. Denies history of fractures.   Neurologic Complains of abnormal gait. Denies disorientation, dizziness, headaches, insomnia, memory loss, numbness/tingling, sensory problems, paralysis, seizure and stroke.   Psychiatric Complains of mood swings. Denies delusions, history of mental illness, depression and euphoria.   Respiratory Denies cough, uses oxygen, coughing up blood, hiccoughs and wheezing.        Physical Exam:  Performed on 01/17/2023  2:03 PM           Constitutional - Well-developed, well-nourished and in no acute distress.  HEENT - Normocephalic, atraumatic.  Sclerae is non-icteric  Oral cavity and oropharynx are without lesions. Mucous membranes are moist.  Neck - Supple.  No palpable cervical or supraclavicular adenopathy.  Abdomen - Soft, nontender and nondistended.  No palpable masses or hepatosplenomegaly.  Musculoskeletal - Pain localizes to the right shoulder joint  Lymphatics - No palpable cervical, supraclavicular, axillary, epitrochlear or inguino-femoral adenopathy.  Extremities - No clubbing, cyanosis, or edema.  Neurologic - CN II-XII are grossly intact.  Motor  strength is 5/5 in bilateral upper and lower extremities and symmetric.  Sensory exam is grossly intact to touch.  Gait is normal.  Patient is alert and oriented x 3.    Vital Signs:  Performed on 01/17/2023 2:00 PM   BMI - 28.662 kg/m2 (HIGH) -    Height - 67 in -    Weight - 183 lbs -    Temperature - 98.7 F -    Pulse - 67 /min -    Respiration - 18 /min -    O2 Sat - 95 % (LOW) -    Pain - 10 -    Fatigue - 0 -     BP - 141/ 67 mm(hg)(HIGH/) - ;   Fall Risk - 40 -    Pathology: Reviewed, see HPI.    Radiology:  I performed a thorough review of the patient's pertinent radiographic reports during this visit.  I also personally reviewed and interpreted pertinent radiographic images.  My interpretation of this review is: PET-CT shows metastasis in the right glenoid fossa which correlates with his pain localization in the right shoulder joint.    Performance Status:  2 - Ambulatory/capable of all self-care, unable to perform any work activities. Up and about more than 50% of waking hours. 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    Recommendation:    The patient has a bony metastasis causing significant pain. I discussed option of a course of palliative radiotherapy which typically provides good pain relief, although this cannot be guaranteed. The patient elected to proceed. I will plan to treat the right shoulder joint / glenoid fossa metastasis to 30 Gy in 10 fractions with 3D conformal technique.    I explained in detail to the patient the diagnosis, prognosis, and various treatment options.  The logistics, risks, rationale, and alternatives to radiotherapy were discussed in detail. Potential side effects include but are not limited to fatigue, skin reaction, alopecia in the treatment field, damage to bone or muscle or nerves, arthritis, decreased right upper extremity function / frozen shoulder.  All questions were  answered to the patient's satisfaction.     The patient understanding these risks and having the opportunity to ask questions, consented to go forward with treatment.    -CT simulation will be schedule on 01/20/23 and plan to start radiation the week of 01/23/23.  -Plan for 30 Gy in 10 fractions to the right shoulder.      Medical Necessity:  3-D Conformal    3-D Conformal External Beam Radiation Therapy is recommended to treat this patient's disease in order to maximize dose to the treatment area while minimizing damage to the surrounding critical tissue(s).    Thank you for allowing me to participate in the care of this patient.  Please feel free to contact me with any questions or concerns.    Length of Visit:  Total duration of encounter spent in pre-visit, intra-visit and post-visit activities: 90 minutes.        [place]      Electronically Signed: Sandria Manly, MD  Electronically Signed Date/Time:01/17/2023 2:35:17 PM   Signature(s) derived from controlled access password        CC: Thomes Lolling

## 2023-01-18 NOTE — Progress Notes (Unsigned)
Name: Stephen [name]   Date of Birth: 1950/12/31  Medical Record Number: [id number]    [facility]     Patient ID: likely metastatic urothelial carcinoma    Stage: Cancer Staging   Ureter malignant neoplasm, left (CMS-HCC)  Staging form: Renal Pelvis And Ureter, AJCC 8th Edition  - Clinical: Stage IV (cTX, cN0, pM1) - Signed by Stephen Beam, MD on 12/01/2022    Oncologic History:    04/22/21  CTU: filling defect in L renal pelvis  05/05/21  Cysto with L ureteroscopy revealed a L papillary tumor, biopsied: mixed high (30%) and low grade urothelial carcinoma  06/16/21  Repeat cysto, papillary tumor: pathology low grade papillary [place]  09/xx/22  Completed induction with mitogel (6 cycles)  08/19/21  HGTa of the bladder, no muscle  08/26/21  Repeat TURBT: NED, muscle identified  10/26/21  MRU: mild thickening in the L renal pelvis  12/24/21  Cysto/L ureteroscopy: dysplasia in the bladder; no disease in L ureter  05/06/22  L PCN placed  06/01/22  TURBT and L ureteroscopy: NED, stricture in place, stent placed  09/29/22  TURBT and L ureteroscopy: NED  10/22/22  MRU: Mild diffuse thickening in the L ureter/renal pelvis. Multiple osseous lesions involving the bilateral iliac crest and right acetabulum concerning for osseous metastatic disease.   11/26/22  PET/CT: diffuse osseous FDG activity with discrete osseous lesions concerning for metastatic disease.  12/22/22  L iliac crest biopsy: metastatic urothelial carcinoma  01/09/23  Seen by ortho, recommend palliative XRT  01/17/23  Seen by rad onc, simulation planned for 2/02    Interim History:  Here today for interim follow-up.  Scheduled to start EV/pembro for treatment of advanced urothelial carcinoma.   Pain not controlled. Patient reports excruciating pain involving R arm; says oxycodone does not work for him.   Recently seen by ortho for assessment of metastatic lesion involving R scapula near glenohumeral joint (SUV Max 8.0) - plan is  for palliative XRT.   Seen by rad onc 1/30.  Has radiation SIM appt tomorrow 2/02 in [place]. Sees Palliative Care Monday 2/05.   Appetite ok, tol regular meals  BM's every few days, takes Senna prn   Neph tubes exchange planned 2/14  Port placement planned 2/26  He's currently staying with his sister, but he thinks he's going to need to find a new place.  He's been contacted by Social Work and our patient navigator, Stephen [name] for additional support.     Review of Systems:   A complete ROS was performed and is negative except as documented above.     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 40 tablet 0    allopurinol (ZYLOPRIM) 100 MG tablet Take 1 tablet (100 mg) by mouth daily. 30 tablet 0  DULoxetine (CYMBALTA) 20 MG CR capsule Take 1 capsule (20 mg) by mouth daily. 90 capsule 3    [DISCONTINUED] gabapentin (NEURONTIN) 100 MG capsule Take 1 capsule (100 mg) by mouth 3 times daily for 7 days. Start taking after your procedure is completed. 21 capsule 0    ketotifen (ALAWAY) 0.025 % ophthalmic solution Place 1 drop into both eyes 2 times daily.      Loratadine (ALAVERT PO) Take 10 mg by mouth daily.      Multiple Vitamins-Minerals (MULTIVITAMIN WITH MINERALS) TABS tablet Take 1 tablet by mouth daily. 30 tablet 1    naloxone (NARCAN) 4 mg/0.1 mL nasal spray For suspected opioid overdose, call 911! Then spray once in one nostril. Repeat after 3  minutes if no or minimal response using a new spray in other nostril. 2 each 0    oxyCODONE (ROXICODONE) 5 MG immediate release tablet Take 1 tablet (5 mg) by mouth every 6 hours as needed for Severe Pain (Pain Score 7-10). 30 tablet 0    pregabalin (LYRICA) 100 MG capsule Take 1 capsule (100 mg) by mouth at bedtime. 30 capsule 2    tamsulosin (FLOMAX) 0.4 MG capsule TAKE 1 CAPSULE BY MOUTH EVERY DAY 90 capsule 1    tizanidine (ZANAFLEX) 2 MG tablet Take 1 tablet (2 mg) by mouth every 8 hours as needed (spasm). (Patient taking differently: Take 2 tablets (4 mg) by mouth every 8 hours as needed (spasm).) 40 tablet 0    traZODone (DESYREL) 50 MG tablet Take 0.5 tablets (25 mg) by mouth at bedtime. 30 tablet 0    triamcinolone (KENALOG) 0.1 % cream Apply 1 Application. topically 2 times daily. Apply a thin layer as directed       No current facility-administered medications on file prior to visit.     Physical Examination:   01/19/23  1257   BP: (!) 136/47   Pulse: 67   Temp: 97.3 F (36.3 C)   Resp: 16   SpO2: 97%     ECOG PFS 1  General: Well-nourished and in no apparent distress???????. Walks with a cane. Holds R arm in pain, R arm is held close to his body.   HEENT: Extraocular movements intact. Moist mucous membranes without oral lesions.  Neck: No thyromegaly or lymphadenopathy.   Chest: Clear to ausculation bilaterally. No wheezes, rales or rhonchi.    Cardiac: Regular rate and rhythm without any murmurs, rubs or gallops.   Abdomen: Abd soft, nontender without any hepatosplenomegaly.   Extremities: No clubbing, cyanosis, or edema.  Skin: No rashes or lesions.   Neurological: Awake, alert and oriented x 3. Attention, language and memory are intact. Strength and sensation are grossly intact.     Labs: Following labs reviewed  Lab Results   Component Value Date    WBC 9.9 01/06/2023    RBC 4.11 (L) 01/06/2023    HGB 12.0 (L) 01/06/2023    HCT 36.9 (L) 01/06/2023    MCV 89.8 01/06/2023    MCHC 32.5 01/06/2023     RDW 14.6 (H) 01/06/2023    PLT 340 01/06/2023    MPV 9.8 01/06/2023      Lab Results   Component Value Date    BUN 21 01/06/2023    CREAT 1.16 01/06/2023    CL 97 (L) 01/06/2023    NA 135 (L) 01/06/2023    K 4.6 01/06/2023    [place] 9.6 01/06/2023    TBILI 0.65 01/06/2023    ALB 4.2  01/06/2023    TP 7.9 01/06/2023    AST 13 01/06/2023    ALK 330 (H) 01/06/2023    BICARB 27 01/06/2023    ALT 7 01/06/2023    GLU 97 01/06/2023        Assessment:  Yoandri Congrove is a 72 year old male with OA, HTN, MDD with L UTUC with metastatic disease ([id number]), with osseous mets (Pet/CT with diffuse enhancement and discrete lesions).     Labs pending today.     No recent imaging to review for today's appointment.     Patient presents today for an interim follow-up. To date, he still has not been scheduled to start his infusions of EV/pembro for reasons which remain unclear. He is in significant pain; he has been seen by ortho and rad onc with plans to start palliative XRT to the metastatic lesion involving his R glenohumeral joint. Sim appt scheduled tomorrow, 2/02. He has tried pain control with oxycodone which has been ineffective; he has an appointment with Palliative Care on Monday 2/05, however, he is requesting pain control in the interim. L PCN draining well, plan for tube exchange in 2/14.  Port placement planned for 2/26.  Called infusion today, pt now scheduled to start treatment tomorrow, 2/02. He will get labs today prior to his infusion. RTC in 1 week for f/u with Sunday Corn, PA-C for follow-up prior to D8 EV ([facility] ok if there are transportation issues). Pt verbalized understanding of this plan and is in agreement.       Advanced Urothelial Carcinoma, [id number]  - Labs today - CBC/CMP  - NGS on tissue - bone biopsy  - Port placement planned 2/26  - C1D1 EV/pembro planned for tomorrow, 2/02  - Treatment plan:  --- Enfortumab vedotin 1.25 mg/kg D1, D8 Q21d  --- Pembrolizumab 200 mg D1 Q21d  --- prn anti-nausea meds  ---  prn antidiarrheal medications  --- prn topical skin care  --- ED warnings   - RTC 1 week with Sunday Corn, PA-C prior to D8 ([place] ok)  - RTC with Dr. Nicole Kindred in 3 weeks prior to C2  - consider adding zometa given significnt bone disease    R shoulder pain  - 2/2 metastatic lesion  - seen by ortho 1/22  - seen by rad onc 1/30, [facility]  - palliative care appt scheduled 2/05  - offered oxycodone but he was not interested in this    L PCN, initially placed 04/2022  - tube exchange planned 2/14  - draining well, will monitor    RTC 1 week with Sunday Corn, PA-C        Reviewed plan with the patient.  All questions answered to patient's satisfaction.  Patient verbalized understanding and agreement with above plan.      Today's visit lasted 30 minutes - including charting, communication with other doctors or caregivers, review of labs, pertinent imaging and test results; treatment planning including note completion, order entry, and coordination of care - and consisted more than 50% of time in the face-to-face patient counseling.       Sunday Corn, PA-C  Senior Physician Assistant  [facility]    Supervising Physician:  Dr. Thomes Lolling      [facility]  [facility]  [address]  [place], [place] [address]  [phone]

## 2023-01-19 ENCOUNTER — Encounter (HOSPITAL_BASED_OUTPATIENT_CLINIC_OR_DEPARTMENT_OTHER): Payer: Self-pay | Admitting: Physician Assistant

## 2023-01-19 ENCOUNTER — Other Ambulatory Visit (INDEPENDENT_AMBULATORY_CARE_PROVIDER_SITE_OTHER): Payer: Medicare Other

## 2023-01-19 ENCOUNTER — Telehealth (HOSPITAL_BASED_OUTPATIENT_CLINIC_OR_DEPARTMENT_OTHER): Payer: Self-pay

## 2023-01-19 ENCOUNTER — Ambulatory Visit: Payer: Medicare Other | Attending: Physician Assistant | Admitting: Physician Assistant

## 2023-01-19 VITALS — BP 136/47 | HR 67 | Temp 97.3°F | Resp 16 | Ht 67.0 in | Wt 183.0 lb

## 2023-01-19 DIAGNOSIS — G893 Neoplasm related pain (acute) (chronic): Secondary | ICD-10-CM | POA: Insufficient documentation

## 2023-01-19 DIAGNOSIS — C7910 Secondary malignant neoplasm of unspecified urinary organs: Secondary | ICD-10-CM

## 2023-01-19 DIAGNOSIS — Z79899 Other long term (current) drug therapy: Secondary | ICD-10-CM | POA: Insufficient documentation

## 2023-01-19 LAB — COMPREHENSIVE METABOLIC PANEL, BLOOD
ALT (SGPT): 8 U/L (ref 0–41)
AST (SGOT): 16 U/L (ref 0–40)
Albumin: 4.4 g/dL (ref 3.5–5.2)
Alkaline Phos: 489 U/L — ABNORMAL HIGH (ref 40–129)
Anion Gap: 11 mmol/L (ref 7–15)
BUN: 27 mg/dL — ABNORMAL HIGH (ref 8–23)
Bicarbonate: 26 mmol/L (ref 22–29)
Bilirubin, Tot: 0.46 mg/dL (ref <1.2)
Calcium: 9.3 mg/dL (ref 8.5–10.6)
Chloride: 104 mmol/L (ref 98–107)
Creatinine: 1.42 mg/dL — ABNORMAL HIGH (ref 0.67–1.17)
Glucose: 112 mg/dL — ABNORMAL HIGH (ref 70–99)
Potassium: 4.8 mmol/L (ref 3.5–5.1)
Sodium: 141 mmol/L (ref 136–145)
Total Protein: 7.9 g/dL (ref 6.0–8.0)
eGFR Based on CKD-EPI 2021 Equation: 53 mL/min/{1.73_m2}

## 2023-01-19 LAB — CBC WITH DIFF, BLOOD
ANC-Automated: 5.2 10*3/uL (ref 1.6–7.0)
Abs Basophils: 0.1 10*3/uL (ref <0.2)
Abs Eosinophils: 0.1 10*3/uL (ref 0.0–0.5)
Abs Lymphs: 1.5 10*3/uL (ref 0.8–3.1)
Abs Monos: 0.5 10*3/uL (ref 0.2–0.8)
Basophils: 1 %
Eosinophils: 1 %
Hct: 37.3 % — ABNORMAL LOW (ref 40.0–50.0)
Hgb: 12.2 gm/dL — ABNORMAL LOW (ref 13.7–17.5)
Imm Gran %: 1 % — ABNORMAL HIGH (ref <1)
Imm Gran Abs: 0.1 10*3/uL — ABNORMAL HIGH (ref <0.1)
Lymphocytes: 21 %
MCH: 29 pg (ref 26.0–32.0)
MCHC: 32.7 g/dL (ref 32.0–36.0)
MCV: 88.8 um3 (ref 79.0–95.0)
MPV: 9.9 fL (ref 9.4–12.4)
Monocytes: 7 %
Plt Count: 257 10*3/uL (ref 140–370)
RBC: 4.2 10*6/uL — ABNORMAL LOW (ref 4.60–6.10)
RDW: 14.5 % — ABNORMAL HIGH (ref 12.0–14.0)
Segs: 70 %
WBC: 7.5 10*3/uL (ref 4.0–10.0)

## 2023-01-19 LAB — TSH, BLOOD: TSH: 1.4 u[IU]/mL (ref 0.27–4.20)

## 2023-01-19 LAB — CORTISOL, BLOOD: Cortisol: 14 ug/dL

## 2023-01-19 LAB — FREE THYROXINE, BLOOD: Free T4: 1.18 ng/dL (ref 0.93–1.70)

## 2023-01-19 MED ORDER — OXYCODONE HCL 10 MG OR TABS
10.0000 mg | ORAL_TABLET | Freq: Four times a day (QID) | ORAL | 0 refills | Status: DC | PRN
Start: 2023-01-19 — End: 2023-01-23

## 2023-01-19 NOTE — Telephone Encounter (Signed)
Outpatient Palliative RN Narrative    - Message received from front desk, [place] requesting sooner palliative appoinment, spoke to patient, there is one available appointment for tomorrow morning at 0900, patient shares that they are scheduling infusion for tomorrow at 0815 which will last an hour and then will need to drive to radiation right after, unable to make a 0900 apt, will keep apt for Monday for now

## 2023-01-19 NOTE — Addendum Note (Signed)
Addended by: Herb Grays on: 01/19/2023 05:28 PM     Modules accepted: Orders

## 2023-01-19 NOTE — Patient Instructions (Addendum)
Patient Instructions:    -- Labs today.    -- Infusion tomorrow (Friday) 2/02 at 8:15 at [facility].    -- Radiation Oncology Simulation appointment: tomorrow (Friday) 2/02 at 12:30pm in [place].    -- Palliative Care appointment: Monday 2/05 at 10am.    -- Return to clinic on 2/8 with [facility]. Please stop at the desk on your way out to schedule. Video visit is ok if there are conflicts with transportation.     Have questions about Advanced Care Planning?  PREPARE ([email or web address])       Future Appointments   Date Time Provider [place]   01/23/2023 10:00 AM Leisa Lenz, MD [facility] [name]   01/27/2023 10:00 AM FAST TRACK MUC FAST MUC   01/27/2023 11:45 AM MUC INFUSION/CTI MUC Onc Chem MUC   02/01/2023 10:30 AM LJ ANGIO 1 [facility] IR [place]   02/02/2023  1:30 PM Doreen Beam, MD [facility]   02/13/2023  9:00 AM Roni Bread ANGIO 1 [facility] IR [place]   03/03/2023  9:00 AM Carlota Raspberry, MD [facility] Website:  [email or web address]    Contact Information:   Thomes Lolling, MD   Assistant Professor of [place] [facility]  Genitourinary Malignancy      Sunday Corn, M.S., PA-C  Sr. Physician Assistant    Clinical questions or concerns  Oncology Nurse Case Manager   [name] RN, BSN  T: [phone]  Department of Urology   [facility]  [place], [place] [address]    Dr. Nicole Kindred Follow Up Appointment Assistance  Administrative Assistant   Fortunato Curling  T: [phone]  F: [phone]    Monday-Friday 8:00- 4:30p.m. (Closed Holidays and Weekends)  Please listen to message carefully for daily information.   Leave a detailed message with your name, medical record number and   telephone number. Messages are usually returned within 24 hours.     [place] Phone List     AFTER HOURS EMERGENCY  NUMBER   Ask for the Oncologist Physician On-Call.                   [place] Scheduling line: [phone]  Call for information about our new location and to schedule or cancel appointments.    [facility] Scheduling line: [phone]   Call to schedule your [name], CT Scans or MRI studies.    PET Scan Scheduling line: [phone]  Call to schedule your PET scan    Prostate MRI Scheduling line: [phone] or [phone]  Call to schedule your prostate MRI.   Performed at the [place] ([place]).    Nuclear Medicine [place] [phone]   Call to schedule your bone scan, [facility] [place]: [place]   Call to schedule, cancel, or reschedule chemotherapy appointments.  Infusion Center Hours--- Monday-Friday 7:00-7:30 p.m.; Sat-Sun 8:00-4:30pm.    [place] ([place]): [phone]  Monday-Friday 730-530 p.m.; Closed Weekends    Radiation Oncology: [phone]    Call to schedule, cancel, or reschedule radiation appointments    Social Worker  [place]: Agnes [place], [place]    T: [place]: Omar Person, LCSW  T: [phone]    Financial Counselors   [phone] at [name]   [phone] or [phone]      [facility]  [address], [place] [address]    -You can also reach [name] by [place]. To set up your MyChart please see the following site: [email or web address]  -For cancer support services, please visit out Patient and [place] support services, please visit:  [email or web address]  -For other support from the [place], please visit: [email or web address]              Thank you for allowing [name] to participate in your care today! We value your feedback as we strive to provide  every patient with an exceptional care experience.     You may receive a patient satisfaction survey in the mail in the next few weeks. Please fill out the survey upon receipt and return it in the self-addressed envelope. Your feedback will be used to help improve the experience for all our patients at [facility].     If you want to share a positive experience you had at our facility please call We Listen at [phone]. If you have any suggestions on how we can improve our services please call the [facility] leadership team at [phone].    For medical questions or emergencies, please call your physician's office or 911. Thank you!          Enfortumab (Padcev) + Pembrolizumab (Keytruda)     Dr. Nicole Kindred has prescribed for you Enfortumab Vedotin (Padcev) + Pembrolizumab Beryle Flock).  These are two medications, both given via an intravenous infusion. This is given in 21 day cycles.  Each cycle is broken down into 3 weeks:     Week One:  Day 1- Labs/Fast track  (OR you can have your labs drawn at any  up to 2 days before your scheduled infusion) --> Dr. Gracy Bruins -->Enfortumab Infusion + Keytruda Infusion (1.5-2 hours for infusion)        Week Two:  Day 8:  Labs/Fast track then Enfortumab Infusion (30 minutes for infusion)        Week Three:  REST       Take Home Medication:  Prochlorperazine (Compazine) 10 mg:  Take 1 tablet every 6 hours, as needed (for nausea/vomiting)      When to call:    Nausea or vomiting unrelieved by medication    Vomiting accompanied by inability to keep down food or fluids    Diarrhea (3 or more episodes within 24 hours)    Fever >100 F    Chills    Difficulty with eating or drinking    Unrelieved pain    Chest pain or pressure    Shortness of breath or difficulty breathing/new persistent cough  -Rash/itching   -Abdominal pain/discomfort   -Bloody stools   -Persistent cough   -Blurry vision   -Grey stools  -Muscle weakness  -Tinging or numbness of the  fingers or toes  -Neurologic changes            *Anything you are concerned about, please do not hesitate to reach out the nurse Danielle at [phone]. I am available M-F 8:00 am - 4:30 pm         *After hours number to call [phone] and ask to speak to the Oncologist on-call        Have questions about Advanced Care Planning?  PREPARE ([email or web address])

## 2023-01-19 NOTE — Addendum Note (Signed)
Addended by: Sunday Corn on: 01/19/2023 08:39 PM     Modules accepted: Orders

## 2023-01-20 ENCOUNTER — Telehealth (HOSPITAL_BASED_OUTPATIENT_CLINIC_OR_DEPARTMENT_OTHER): Payer: Self-pay

## 2023-01-20 ENCOUNTER — Ambulatory Visit
Admission: RE | Admit: 2023-01-20 | Discharge: 2023-01-20 | Disposition: A | Discharge status: Home / Self Care | Payer: Medicare Other | Attending: Hematology & Oncology | Admitting: Hematology & Oncology

## 2023-01-20 ENCOUNTER — Encounter (HOSPITAL_BASED_OUTPATIENT_CLINIC_OR_DEPARTMENT_OTHER): Payer: Self-pay | Admitting: Hematology & Oncology

## 2023-01-20 VITALS — BP 125/57 | HR 57 | Temp 98.9°F | Resp 16 | Ht 67.0 in | Wt 187.1 lb

## 2023-01-20 DIAGNOSIS — G893 Neoplasm related pain (acute) (chronic): Secondary | ICD-10-CM | POA: Insufficient documentation

## 2023-01-20 DIAGNOSIS — C679 Malignant neoplasm of bladder, unspecified: Secondary | ICD-10-CM | POA: Insufficient documentation

## 2023-01-20 DIAGNOSIS — C662 Malignant neoplasm of left ureter: Secondary | ICD-10-CM

## 2023-01-20 DIAGNOSIS — Z5112 Encounter for antineoplastic immunotherapy: Secondary | ICD-10-CM | POA: Insufficient documentation

## 2023-01-20 DIAGNOSIS — Z936 Other artificial openings of urinary tract status: Secondary | ICD-10-CM | POA: Insufficient documentation

## 2023-01-20 MED ORDER — ONDANSETRON HCL 4 MG/2ML IV SOLN
8.0000 mg | Freq: Once | INTRAMUSCULAR | Status: AC
Start: 2023-01-20 — End: 2023-01-20
  Administered 2023-01-20: 8 mg via INTRAVENOUS
  Filled 2023-01-20: qty 4

## 2023-01-20 MED ORDER — SODIUM CHLORIDE 0.9 % IV SOLN
Freq: Once | INTRAVENOUS | Status: AC
Start: 2023-01-20 — End: 2023-01-20

## 2023-01-20 MED ORDER — SODIUM CHLORIDE 0.9 % IV SOLN
100.0000 mg | Freq: Once | INTRAVENOUS | Status: AC
Start: 2023-01-20 — End: 2023-01-20
  Administered 2023-01-20: 100 mg via INTRAVENOUS
  Filled 2023-01-20: qty 60

## 2023-01-20 MED ORDER — HYDROMORPHONE HCL 1 MG/ML IJ SOLN
0.5000 mg | Freq: Once | INTRAMUSCULAR | Status: AC | PRN
Start: 2023-01-20 — End: 2023-01-20
  Administered 2023-01-20: 0.5 mg via INTRAVENOUS
  Filled 2023-01-20: qty 0.5

## 2023-01-20 MED ORDER — SODIUM CHLORIDE 0.9 % IV SOLN
200.0000 mg | Freq: Once | INTRAVENOUS | Status: AC
Start: 2023-01-20 — End: 2023-01-20
  Administered 2023-01-20: 200 mg via INTRAVENOUS
  Filled 2023-01-20: qty 8

## 2023-01-20 MED ORDER — PROCHLORPERAZINE MALEATE 10 MG OR TABS
10.0000 mg | ORAL_TABLET | Freq: Four times a day (QID) | ORAL | 5 refills | Status: DC | PRN
Start: 2023-01-20 — End: 2023-11-06

## 2023-01-20 NOTE — Telephone Encounter (Addendum)
Called patient at his mobile number - no answer, left voicemail letting him know a new, higher dose prescription for oxycodone was written by [place] and [facility] [address]  Patient was encouraged to take oxy on a consistent schedule for optimal pain coverage. It was also mentioned that he would be given a dose of IV dilaudid at his infusion today to help kick-start better pain control. Patient was encouraged to call us back for questions.

## 2023-01-20 NOTE — Interdisciplinary (Signed)
Chemotherapy Nursing Note - [place]    Stephen [name] is a 72 year old male who presents for chemotherapy cycle 1, day(s) 1 of PADCEV/Keytruda.    Current Chemotherapy Signed Informed Consent verified/obtained yes    Pre-treatment nursing assessment:  No problems identified upon assessment. Patient arrived for treatment today, patient has no questions about medication and denies need for console. Due to delay in appointment start time, patient rescheduled rad onc appointment for today, plan for 10 rounds to right arm, new appointment Wednesday. Patient feeling okay, pain to right arm that is chronic since diagnosis, no nausea/vomiting, constipation or diarrhea. Pain medication ordered by MD, given with some relief.      Labs drawn yesterday, patient mets treatment parameters, pre-medication given.    PIV placed to left arm with good blood return.     Lab Results   Component Value Date    ABSNEUTRO 5.2 01/19/2023    WBC 7.5 01/19/2023    RBC 4.20 (L) 01/19/2023    HGB 12.2 (L) 01/19/2023    HCT 37.3 (L) 01/19/2023    MCV 88.8 01/19/2023    MCHC 32.7 01/19/2023    RDW 14.5 (H) 01/19/2023    PLT 257 01/19/2023     Lab Results   Component Value Date    NA 141 01/19/2023    K 4.8 01/19/2023    CL 104 01/19/2023    BICARB 26 01/19/2023    BUN 27 (H) 01/19/2023    CREAT 1.42 (H) 01/19/2023    GLU 112 (H) 01/19/2023    [place] 9.3 01/19/2023       Lab Results   Component Value Date    AST 16 01/19/2023    ALT 8 01/19/2023    GGT 131 (H) 01/17/2022    LDH 172 01/19/2022    ALK 489 (H) 01/19/2023    TP 7.9 01/19/2023    ALB 4.4 01/19/2023    TBILI 0.46 01/19/2023    DBILI <0.2 01/19/2022      Vitals:    01/20/23 0858 01/20/23 0902 01/20/23 1204   BP:  (!) 133/59 (!) 125/57   BP Location:  Left arm Left arm   BP Patient Position:  Sitting Sitting   Pulse:  55 57   Resp:  16 16   Temp:  97.7 F (36.5 C) 98.9 F (37.2 C)   TempSrc:  Temporal Oral   SpO2:  98% 98%   Weight: 84.8 kg (187 lb 1 oz)     Height: '5\' 7"'$   (1.702 m)       Pain Score: 8  Body surface area is 2 meters squared.  Body mass index is 29.3 kg/m.    ECOG  ECOG: 1     PHQ2  PHQ2 Total: 2    Chemotherapy:  Medications   enfortumab vedotin-ejfv (PADCEV) 100 mg in sodium chloride 0.9 % 100 mL chemo infusion (0 mg IntraVENOUS IV Stop 01/20/23 1035)   sodium chloride 0.9% infusion (0 mL IntraVENOUS Stopped 01/20/23 1200)   ondansetron (ZOFRAN) injection 8 mg (8 mg IntraVENOUS Given 01/20/23 0930)   pembrolizumab (KEYTRUDA) 200 mg in sodium chloride 0.9 % 100 mL infusion (0 mg IntraVENOUS Completed 01/20/23 1140)   HYDROmorphone (DILAUDID) injection 0.5 mg (0.5 mg IntraVENOUS Given 01/20/23 0945)     Stephen [name] tolerated treatment well.    Post blood return: Brisk  IV access post infusion: NS and IV discontinued. Site CDI, wrapped with gauze/coban.    Patient  Education  Learner: Patient  Barriers to learning: No Barriers  Readiness to learn: Acceptance  Method: Explanation  Response: Verbalizes understanding    Discharge Plan  Discharge instructions given to patient.  Future appointments: date given and reviewed with treatment plan.  Discharge Mode: Ambulatory  Discharge Time: [place] by: Self  Discharged To: Home

## 2023-01-23 ENCOUNTER — Encounter: Payer: Self-pay | Admitting: Family Medicine

## 2023-01-23 ENCOUNTER — Telehealth (HOSPITAL_BASED_OUTPATIENT_CLINIC_OR_DEPARTMENT_OTHER): Payer: Self-pay | Admitting: Hematology & Oncology

## 2023-01-23 ENCOUNTER — Ambulatory Visit: Payer: Medicare Other | Attending: Hematology & Oncology | Admitting: Family Medicine

## 2023-01-23 ENCOUNTER — Encounter (HOSPITAL_BASED_OUTPATIENT_CLINIC_OR_DEPARTMENT_OTHER): Payer: Self-pay

## 2023-01-23 ENCOUNTER — Other Ambulatory Visit (HOSPITAL_BASED_OUTPATIENT_CLINIC_OR_DEPARTMENT_OTHER): Payer: Self-pay | Admitting: Pharmacist

## 2023-01-23 ENCOUNTER — Encounter (HOSPITAL_BASED_OUTPATIENT_CLINIC_OR_DEPARTMENT_OTHER): Payer: Self-pay | Admitting: Family Medicine

## 2023-01-23 ENCOUNTER — Other Ambulatory Visit: Payer: Self-pay

## 2023-01-23 VITALS — BP 132/75 | HR 67 | Temp 98.7°F | Resp 18 | Ht 67.0 in | Wt 187.4 lb

## 2023-01-23 DIAGNOSIS — N529 Male erectile dysfunction, unspecified: Secondary | ICD-10-CM

## 2023-01-23 DIAGNOSIS — G8929 Other chronic pain: Secondary | ICD-10-CM | POA: Insufficient documentation

## 2023-01-23 DIAGNOSIS — M25511 Pain in right shoulder: Secondary | ICD-10-CM

## 2023-01-23 DIAGNOSIS — N1330 Unspecified hydronephrosis: Secondary | ICD-10-CM | POA: Insufficient documentation

## 2023-01-23 DIAGNOSIS — R110 Nausea: Secondary | ICD-10-CM | POA: Insufficient documentation

## 2023-01-23 DIAGNOSIS — M62838 Other muscle spasm: Secondary | ICD-10-CM | POA: Insufficient documentation

## 2023-01-23 DIAGNOSIS — R000 Tachycardia, unspecified: Secondary | ICD-10-CM | POA: Insufficient documentation

## 2023-01-23 DIAGNOSIS — C7910 Secondary malignant neoplasm of unspecified urinary organs: Secondary | ICD-10-CM | POA: Insufficient documentation

## 2023-01-23 DIAGNOSIS — M5441 Lumbago with sciatica, right side: Secondary | ICD-10-CM | POA: Insufficient documentation

## 2023-01-23 DIAGNOSIS — Z515 Encounter for palliative care: Secondary | ICD-10-CM

## 2023-01-23 DIAGNOSIS — G893 Neoplasm related pain (acute) (chronic): Secondary | ICD-10-CM | POA: Insufficient documentation

## 2023-01-23 DIAGNOSIS — G4700 Insomnia, unspecified: Secondary | ICD-10-CM | POA: Insufficient documentation

## 2023-01-23 DIAGNOSIS — C679 Malignant neoplasm of bladder, unspecified: Secondary | ICD-10-CM | POA: Insufficient documentation

## 2023-01-23 DIAGNOSIS — Z9189 Other specified personal risk factors, not elsewhere classified: Secondary | ICD-10-CM | POA: Insufficient documentation

## 2023-01-23 MED ORDER — OXYCODONE HCL 10 MG OR TABS
ORAL_TABLET | ORAL | 0 refills | Status: DC
Start: 2023-01-23 — End: 2023-03-01

## 2023-01-23 MED ORDER — TIZANIDINE HCL 2 MG OR TABS
4.00 mg | ORAL_TABLET | Freq: Three times a day (TID) | ORAL | 0 refills | Status: DC | PRN
Start: 2023-01-23 — End: 2023-02-13

## 2023-01-23 MED ORDER — METHADONE HCL 5 MG OR TABS
5.0000 mg | ORAL_TABLET | Freq: Two times a day (BID) | ORAL | 0 refills | Status: DC
Start: 2023-01-23 — End: 2023-02-13
  Filled 2023-01-23: qty 15, 8d supply, fill #0

## 2023-01-23 MED ORDER — SENNA 8.6 MG OR TABS
17.2000 mg | ORAL_TABLET | Freq: Every day | ORAL | 2 refills | Status: DC
Start: 2023-01-23 — End: 2023-03-24
  Filled 2023-01-23: qty 180, 90d supply, fill #0

## 2023-01-23 MED ORDER — NALOXONE HCL 4 MG/0.1ML NA LIQD
NASAL | 0 refills | Status: AC
Start: 2023-01-23 — End: ?
  Filled 2023-01-23: qty 2, 2d supply, fill #0

## 2023-01-23 MED ORDER — SILDENAFIL CITRATE 20 MG PO TABS: 40.0000 mg | ORAL_TABLET | Freq: Every day | ORAL | 5 refills | Status: DC | PRN

## 2023-01-23 NOTE — Telephone Encounter (Signed)
E[facility]scribed [facility].   Plz update pt's insurance info.

## 2023-01-23 NOTE — Telephone Encounter (Signed)
-----   Message from [name], RN sent at 01/23/2023 10:52 AM PST -----  Oh I didn't see the 2/22 with Nicole Kindred!    Keep 2/22 with Nicole Kindred, but cancel the 2/15 we don't need that      ----- Message -----  From: Betsey Amen  Sent: 01/23/2023  10:44 AM PST  To: [name], RN    Both appts need to be cancelled he has a 2/15 and 2/22 w/ Dr. Nicole Kindred    [place]   ----- Message -----  From: [name], RN  Sent: 01/23/2023  10:40 AM PST  To: [facility] Front Desk Staff    FD-    Pt's 2/15 Nicole Kindred appt should be r/s to 2/22 with [place].  If pt asks the reason, the pt needs a provider visit closer to his treatment date which is 2/23 and Dr. Nicole Kindred is OOT that week.    Thanks    D

## 2023-01-23 NOTE — Progress Notes (Signed)
Methadone Pharmacist Progress Note   [email or web address]    Chronic Pain Indication: cancer related pain   3  Current Opioid Regimen: oxy 15-'20mg'$  q4 prn pain    Opioid Tolerant Patient (for new starts only)? YES    Initial Dose of Methadone < or = 30 mg/day (for new starts only)?yes    EKG (List QTc and date)? 364 (12/26/22)    Any relevant MAJOR/SEVERE drug interactions? Only with high doses of methadone    Trazodone Hydrochloride and Methadone  Trazodone Hydrochloride may increase the risk of QT prolongation and other serious toxicities when given with Methadone  Avoid coadministration of trazodone and methadone due to an additive risk of QT prolongation. Concomitant use of opioid agonists with trazodone may also cause excessive sedation, somnolence, and increase the risk for serotonin syndrome. Limit the use of opioid pain medications with trazodone to only patients for whom alternative treatment options are inadequate. If concurrent use is necessary, use the lowest effective doses and minimum treatment durations needed to achieve the desired clinical effect. Educate patients about the risks and symptoms of serotonin syndrome and excessive CNS depression. Trazodone can prolong the QT/QTc interval at therapeutic doses, and there are postmarketing reports of torsade de pointes (TdP). Methadone is associated with an increased risk for QT prolongation and torsade de pointes (TdP), especially at higher doses (i.e., more than 200 mg/day but averaging approximately 400 mg/day in adult patients). Most cases involve patients being treated for pain with large, multiple daily doses of methadone, although cases have been reported in patients receiving doses commonly used for maintenance treatment of opioid addiction.    I have reviewed this patient's medication history and chronic pain indication to ensure that methadone is used in accordance with our [place] Methadone in Pain Management  Guideline or the FDA-approved labeling and prescribing information.

## 2023-01-23 NOTE — Interdisciplinary (Signed)
Palliative Care RN Note    Reason for visit: consultation  Dx metastatic urothelial carcinoma    Advanced Care Planning: on file   POLST not on file    Assessment:    Chief complaint: pain and psychosocial/family support    - Patient Orientation: A&O x 4, answers questions readily and appropriately with recent and remote memories intact    Pain Assessment:  Location: right arm and shoulder   Current pain  10+  Worst pain  20+  Length of time pain medication lasts not taking is not helping          Pain Medication routine  Oxycodone 10 mg q 6 hrs stopped taking last Friday  Cymbalta 20 mg taking daily   Stopped ibuprofen and tylenol    Bowel Movements:   - Consistency normal  - managed with Senna prn   - LBM: yesterday     Nausea:  - taking compazine prn    Anxiety/depression  - very angry and upset     Sleep/Fatigue  -  trazodone 50 mg hs    Appetite  -  did not assess    WellBeing Assessment:   -Patient's needs currently being addressed.  Patient's affect/mood appropriate for situation.       Education: Network engineer and verbal instruction provided regarding plan of care.   Barriers to learning assessed: No barriers noted. Patient verbalized understanding of teaching and instructions.       Contact information, AVS given and reviewed related to plan of care. Questions answered to patient's satisfaction and verbalized understanding. Patient/family aware to call with any questions or concerns that may arise.    Tayshaun Kroh Christen RN

## 2023-01-23 NOTE — Patient Instructions (Addendum)
[name] Palliative Care Service      PLAN OF CARE  -We will start you on methadone '5mg'$  1 tablet by mouth twice a day  -We will increase your oxycodone immediate release (Roxicodone) to '10mg'$  1.5 tablets to 2 tablets ('15mg'$  to '20mg'$ ) by mouth every 4 hours as needed for pain  -Continue Senna 2 tablets by mouth daily, increase to twice a day as needed for constipation  -We will call you tomorrow to check on your pain    We will see you for follow up on 02/27/23 at 8 AM in person at [name] (60 minutes)    Please bring ALL of your medications to ALL of your appointments  Please bring medications that you take regularly as well as the ones you take as needed      List of Stephen [name] Service Team Members    Providers  Mare Loan, NP  Pecola Lawless, DO  Isidor Holts, MD  Alroy Bailiff, MD  Fannie Knee, MD    Administrative Assistant/Team Coordinator   [facility]    Social Worker  [address] [place], [place] [phone] (office)    Spiritual Counselor/Chaplain  Marcelle Smiling, [place], [facility]    Nurse Case Managers  Nicole Kindred, RN   Nestor Ramp, RN, [place] RN  Artelia Laroche LVN  .............................................................................................................................................    How to contact us:    RN LINE:  [phone] (Please leave a message)  FAX:   [phone]  MyChart:  Please use MyChart to communicate non-emergent, non-symptom related issues      [place]: [phone]     Hours: Monday through Friday 8:00 am - 3:30 pm   We currently do not offer services on Saturday or Sunday or holidays     Emergency: please call 911 or go to the nearest emergency room     Doctor on Call: for urgent issues before 8:00 am and after 4:00 pm on weekdays and all day on Holidays, Saturday and Sundays,  [phone] and ask for the Palliative Doctor On Call.        .............................................................................................................................................    Special Instructions Regarding Medications:     1. Prescription Refills  -please let us know at least 72 hours in advance if you need a refill via phone call or via MyChart message  -Opioid Pain medications and any other controlled drug  will be written at your appointment.   -If you miss an appointment, please call [phone] to reschedule your appointment.   -You must be seen in the clinic to receive prescriptions for opioid medications and controlled medications      2. Storage   -prescriptions should be stored in the original bottles in a cool, dry place.  -please keep then securely out of reach of children and pets.     3. Disposal: Medication disposal is determined by your city. This link will take you to a list of agencies throughout [facility]:     [email or web address]    4. Precaution: Please keep all medications out of reach of children.    ............................................................................................................................................Marland Kitchen    Advance Care Planning    Many patients ask about the differences between an Advance Directive and a POLST.     Here is a Diplomatic Services operational officer that clarifies the differences between these documents:   [email or web address].cz     Primary differences between an Advance Directive and a POLST are:     An  Advance Directive is for:   1. Anyone 86 years of age or older   2. Provides instructions for future treatment   3. Appoints a health care representative   4. Guides inpatient treatment decisions when make available     A POLST is for:   1. Persons with serious illness - at any age   27. Provides medical orders for current treatment   3. Guides actions by Emergency Medical Personnel  when made available   4. Guides inpatient treatment decisions when made available  5. This is a bright pink form that should be placed on your refrigerator   .............................................................................................................................................    We encourage all our patients to plan for emergencies outside of emergencies.  One key issue is understanding the facts surrounding emergency care and treatment preferences.  Please listen to the following 20-minute podcast with your loved ones to learn more: [email or web address]  .............................................................................................................................................    Patient Experience Department    [facility]'s Patient Experience Department is here to assist you and your family to ensure that your experience with [name] is a positive one. Please tell us about your experience by contacting us at [phone], or at [email or web address].  Your feedback will be kept confidential.

## 2023-01-23 NOTE — Progress Notes (Signed)
[facility] Palliative Care Service   Outpatient Social Work Note    Patient Information / Diagnosis:  Monday January 23, 2023 Stephen [name], a 72 year old year-old man, presents today with a history of likely metastatic urothelial carcinoma with diffuse osseous mets, for an initial outpatient palliative care appointment. He is under the care of Stephen [name] oncology Dr Stephen [name]. He met with Dr Stephen [name] and me in person today. 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 He lives with his sister, her husband, and her daughter. He doesn't like the husband or the daughter. He likes to focus on his sister and her dog. He likes to go for a walk in the morning, with her dog, to the beach, to get a coffee. He walks with a rolling cart so that he can do his own shopping, and for balance. He has a walker but prefers the cart. He mentions his family won't allow him to drive - and he doesn't plan to now that he's taking opiate pain medications - so he uses the bus and trolley to get around.     Current Functional Status:  Using a cane today. Usually uses a rolling cart so that he carry things in it while he walks. He uses [employer] transportation to get to appointments.     Living Situation:  Patient lives with his sister and her husband and her daughter in [place], [place].     Funding:  Medicare A & B and [employer] on file. 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     Advance Care Planning  Understanding of Illness and Treatment Plan:  Patient reports some delays in getting scheduled to start radiation at [facility]. He has already started infusions with Padcev and Pembro. Patient reports that he was told he may live 1-2 years with treatment.   Hopes/Goals:  Patient hopes for a medicine to get rid of his pain. Patient hopes to continue being independent, going out for his walks and to do his shopping.   POLST/Advance Directives:  AD on file. Time limited trial. Focus on pain relief is very important to patient.   Health Care Representative/Surrogate Decision Maker (in absence of advance directive):  Stephen [name] [phone]      Assessment:  This is a 72 year-old man with metastatic cancer who is understandably distressed by his R shoulder and arm pain. We explained that we likely cannot get rid of his pain, but would like to try to help reduce his pain.     Intervention:  Education/reality testing and Psychosocial assessment  Plan:  PALL SW FOLLOW UP: SW to remain available PRN    Stephen Battles, LCSW (she, her)   Clinical Social Worker       Outpatient Palliative Care

## 2023-01-23 NOTE — Telephone Encounter (Signed)
Left message for patient that his 02/02/23 appt has been cancelled per MD's nurse

## 2023-01-23 NOTE — Goals of Care (Signed)
Advance Care Planning      Patient did not want to discuss at this time   [place]

## 2023-01-23 NOTE — Progress Notes (Signed)
 [name] Initial Consult    Requested by Dr. Brock Ra    CC: Pain, psychosocial support    HPI: Stephen [name] is a 72 year old male with a history of metastatic urothelial carcinoma, OA, HTN, MDD, gout referred to Palliative Care clinic for symptom management and psychosocial support.    Per most recent Oncology note on 01/19/23, patient currently on treatment with EV/pembrolizumab for treatment of advanced urothelial carcinoma.  Recently seen by Orthopedics for assessment of metastatic lesion involving the R scapula near glenohumeral joint, planned for palliative XRT.  Had [facility] appointment on 01/20/23 in [facility] planned, got rescheduled but still planning radiation treatment.  Nephrostomy tube exchange planned for 02/01/23.  Planned for port placement on 02/13/23.    Today we met with patient in palliative care clinic. We introduced him to our team and described our role in their care. We addressed several issues during their visit:     #) Pain: See description below.     Pain Assessment:  Location: Shoulder, right ribs  Quality: Sharp, cramping, muscle pain, burning  Severity      Current pain score 1-10: 10      Worst pain score 1-10: 10  Duration/Timing: Constant  Modifying factors       Aggravating factors: Movement, activities       Alleviating factors: None identified    -Stopped taking oxycodone IR 3 days ago, not finding it helpful for his pain.  Difficult to get history from patient as to how much he was taking and for how long, but seems to have been taking at least oxycodone IR 10mg  PO q4h to q6h around the clock up until 3 days ago.  -Has tried Ibuprofen, did not help  -Had a fall (states it was in 10/2022)  and has been having worsening pain since then.  Got evaluated on 01/09/23 with Orthopedics, got XR Lumbosacral spine, XR R Pelvis ordered but I don't see any reports from these.  Had XR R shoulder on 12/15/22 showing mild degenerative changes but no fractures or dislocations  -Also  has followed with Pain Management (Dr. Earlene Plater)  -Has been having lower back pain and neck pain as well  -Takes duloxetine and pregabalin but not helping too much  -Has had morphine IV in the hospital in the past, has never had a bad reaction  -Has had Fentanyl IV in the hospital in the past with no adverse reactions, did not help that much with pain  -Has also had hydromorphone IV in the past in the hospital  -Has appointment for radiation on 01/25/23  -Takes pregabalin 100mg  PO daily, not sure if getting any benefit  -Takes tizanidine 4mg  PO q8h PRN, taking 1-2 times a day  -Takes duloxetine 20mg  PO daily, not sure if getting any benefit  -Denies any adverse effects of the oxycodone IR such as sedation, lethargy, myoclonus or pruritus  -States he is not currently taking lorazepam or diazepam anymore (noted these prescriptions remotely on CURES)  -Per CURES: oxycodone IR 10mg  #90 on 01/20/23, oxycodone IR 5mg  #30 on 01/06/23, pregabalin 100mg  #30 on 12/30/22, codeine solution #131mL on 12/28/22, Norco 5/325mg  #10 on 12/27/22    #) Bowel movements:  -Takes Senna 2 tablets PO daily PRN  -Has been having regular BM  -No constipation/diarrhea reported today    #) Nausea:  -Takes prochlorperazine 10mg  PO q6h PRN for nausea  -No nausea/vomiting reported today    #) Nutrition:  -Reports fair appetite  -  Current weight: 85kg    #) Insomnia:  -Takes trazodone 50mg  1.5 tablets (75mg ) PO nightly, helps with sleep    #) Urothelial carcinoma:  -Shares that in terms of progonsis, his understand is "I'm going to make it until Christmas," may live 1-2 years with treatment  -Currently on treatment with EV/pembrolizumab and planned for radiation soon   -Recently admitted from 11/26/22-11/30/22 for catheter associated UTI and enterobacter infection at [facility]    #) Psychosocial domain:  -Enjoys going to [employer]  -His sister Rinaldo Cloud has been an important source of support for him  -Does not get along well with brother-in-law or  sister's dog  -Worked as an ED nurse in the past, stopped years ago (in 1990)  -Uses cart to go grocery shopping  -Rides bus and takes trolley for transportation  [employer] with a cane  -Also uses a shopping cart to ambulate sometimes and to do shopping    #) Advance care planning:  -Health care agent: Colman Cater (sister)    ROS: Symptom Assessment      01/20/2023    11:23 AM 11/04/2021     1:42 PM   Palliative Care Symptom Assessment   ECOG 1    Palliative performance scale   70       Additional ROS reviewed in detail and negative:  Review of Systems   Constitutional:  Positive for fatigue. Negative for appetite change, chills, diaphoresis and fever.   HENT:   Negative for sore throat, trouble swallowing and voice change.    Eyes:  Negative for icterus.   Respiratory:  Negative for chest tightness, cough, hemoptysis, shortness of breath and wheezing.    Cardiovascular:  Negative for leg swelling and palpitations.   Gastrointestinal:  Negative for abdominal pain, blood in stool, constipation, diarrhea, nausea and vomiting.   Genitourinary:  Positive for difficulty urinating. Negative for dysuria, frequency and hematuria.    Musculoskeletal:  Positive for arthralgias, back pain and gait problem.   Neurological:  Positive for gait problem. Negative for light-headedness and speech difficulty.   Psychiatric/Behavioral:  Negative for suicidal ideas.         Outpatient Palliative Regimen:  Acetaminophen 1000mg  PO q8h PRN pain  Duloxetine 20mg  PO daily  Oxycodone IR 10mg  PO q6h PRN pain  Prochlorperazine 10mg  PO q6h PRN nausea  Tizanidine 2mg  PO q8h PRN muscle spasms  Trazodone 50mg  1/2 tablet (25mg ) PO nightly   Pregabalin 1 00mg  PO nightly   Narcan PRN      Conversion to Oral Morphine Equivalent = Estimated to be 60 to 90; stopped taking all opioids 3 days ago    Allergies & Reactions: Review of patient's allergies indicates Sulfa drugs    Medications: (reviewed today)  Current Outpatient Medications on File Prior to Visit    Medication Sig Dispense Refill    acetaminophen (TYLENOL) 500 MG tablet Take 2 tablets (1,000 mg) by mouth every 8 hours as needed for Mild Pain (Pain Score 1-3) or Moderate Pain (Pain Score 4-6). 40 tablet 0    allopurinol (ZYLOPRIM) 100 MG tablet Take 1 tablet (100 mg) by mouth daily. 30 tablet 0    DULoxetine (CYMBALTA) 20 MG CR capsule Take 1 capsule (20 mg) by mouth daily. 90 capsule 3    [DISCONTINUED] gabapentin (NEURONTIN) 100 MG capsule Take 1 capsule (100 mg) by mouth 3 times daily for 7 days. Start taking after your procedure is completed. 21 capsule 0    ketotifen (ALAWAY) 0.025 % ophthalmic  solution Place 1 drop into both eyes 2 times daily.      Loratadine (ALAVERT PO) Take 10 mg by mouth daily.      Multiple Vitamins-Minerals (MULTIVITAMIN WITH MINERALS) TABS tablet Take 1 tablet by mouth daily. 30 tablet 1    [DISCONTINUED] naloxone (NARCAN) 4 mg/0.1 mL nasal spray For suspected opioid overdose, call 911! Then spray once in one nostril. Repeat after 3 minutes if no or minimal response using a new spray in other nostril. 2 each 0    oxyCODONE (ROXICODONE) 10 MG tablet Take 1.5 tablets to 2 tablets (15mg  to 20mg ) by mouth every 4 hours as needed for pain 90 tablet 0    [DISCONTINUED] oxyCODONE (ROXICODONE) 10 MG tablet Take 1 tablet (10 mg) by mouth every 6 hours as needed for Severe Pain (Pain Score 7-10) for up to 30 days. 90 tablet 0    pregabalin (LYRICA) 100 MG capsule Take 1 capsule (100 mg) by mouth at bedtime. 30 capsule 2    prochlorperazine (COMPAZINE) 10 MG tablet Take 1 tablet (10 mg) by mouth every 6 hours as needed (Nausea/Vomiting). 30 tablet 5    tamsulosin (FLOMAX) 0.4 MG capsule TAKE 1 CAPSULE BY MOUTH EVERY DAY 90 capsule 1    tizanidine (ZANAFLEX) 2 MG tablet Take 2 tablets (4 mg) by mouth every 8 hours as needed (spasm). 90 tablet 0    [DISCONTINUED] tizanidine (ZANAFLEX) 2 MG tablet Take 1 tablet (2 mg) by mouth every 8 hours as needed (spasm). (Patient taking differently: Take  2 tablets (4 mg) by mouth every 8 hours as needed (spasm).) 40 tablet 0    traZODone (DESYREL) 50 MG tablet Take 0.5 tablets (25 mg) by mouth at bedtime. 30 tablet 0    triamcinolone (KENALOG) 0.1 % cream Apply 1 Application. topically 2 times daily. Apply a thin layer as directed       No current facility-administered medications on file prior to visit.       Past Medical History:  Past Medical History:   Diagnosis Date    Chronic back pain     Congenital hydronephrosis     Gout     Headache     Hematuria     HTN (hypertension) 12/10/2021    Kidney disease     Kidney stones     Major depressive disorder, single episode     Polyarthropathy or polyarthritis of multiple sites     Retinal detachment     Urethral stricture        Past Surgical History:  Past Surgical History:   Procedure Laterality Date    CT INSERTION OF SUPRAPUBIC CATH  09/25/2015    NEPHRECTOMY Right 1955    APPENDECTOMY      COLONOSCOPY      CYSTOSCOPY      CYSTOSCOPY W/ LASER LITHOTRIPSY      OTHER SURGICAL HISTORY      Interstim 01/29/2011    SPINE SURGERY  09/21    Lumbar-sacral fusion    TRANSURETHRAL RESECTION OF PROSTATE         Social History:  Healthcare agent/surrogate decision-maker: Colman Cater (sister)  Social History     Social History Narrative    Not on file        Family History:  Family History   Adopted: Yes   Family history unknown: Yes       PEX:  Vitals:    01/23/23 1009   BP: 132/75   BP Location: Right  arm   BP Patient Position: Sitting   BP cuff size: Regular   Pulse: 67   Resp: 18   Temp: 98.7 F (37.1 C)   TempSrc: Temporal   SpO2: 98%   Weight: 85 kg (187 lb 6.4 oz)   Height: 5\' 7"  (1.702 m)     General: No acute distress, well nourished, well developed  HEENT: PERRLA, EOMI, no conjunctivitis or scleral icterus noted.   Nares patent.  Neck:  Trachea midline.  Lungs: Clear to auscultation bilaterally, no wheezes, no rhonchi, no rales  CV: Regular rate and rhythm, no murmurs, no rubs, no gallops  Abdomen: Soft,  nondistended, nontender to palpation, no rebound or guarding, normoactive bowel sounds in all four quadrants, no CVAT  Neuro: Oriented x3, no dysarthria, moving all four extremities spontaneously, 5/5 strength throughout  Psych: Thought process linear and goal-directed, thought content appropriate, affect full range and appropriate, denies suicidal ideation.  No evidence of hallucinations or delusions  Skin: Warm and well perfused.  Brisk capillary refill.      Pertinent Labs:     Lab Results   Component Value Date    WBC 7.5 01/19/2023    RBC 4.20 (L) 01/19/2023    HGB 12.2 (L) 01/19/2023    HCT 37.3 (L) 01/19/2023    MCV 88.8 01/19/2023    MCHC 32.7 01/19/2023    RDW 14.5 (H) 01/19/2023    PLT 257 01/19/2023    MPV 9.9 01/19/2023       Lab Results   Component Value Date    BUN 27 (H) 01/19/2023    CREAT 1.42 (H) 01/19/2023    CL 104 01/19/2023    NA 141 01/19/2023    K 4.8 01/19/2023    [place] 9.3 01/19/2023    TBILI 0.46 01/19/2023    ALB 4.4 01/19/2023    TP 7.9 01/19/2023    AST 16 01/19/2023    ALK 489 (H) 01/19/2023    BICARB 26 01/19/2023    ALT 8 01/19/2023    GLU 112 (H) 01/19/2023       Above labs reviewed    Pertinent Diagnostic Study Findings:  PET/CT 11/26/22  IMPRESSION:  1. Left percutaneous nephrostomy in place. Nonspecific soft tissue thickening and inflammatory stranding surrounding the left renal collecting system and proximal left ureter is incompletely assessed with this technique. Metabolic analysis is particularly technically limited given intense hypermetabolic activity related to excreted radiotracer.  2. Multiple small adjacent lymph nodes predominantly in the retroperitoneal region demonstrate metabolic activity at or slightly above blood pool and are nonspecific.  3. Diffuse marrow hypermetabolic activity with multiple discrete hypermetabolic osseous lesions. Background of diffuse marrow hypermetabolic activity is nonspecific and could represent sequela of therapy. However, a diffusely  infiltrative process is not excluded. Discretely hypermetabolic osseous lesions are concerning for osseous metastatic disease.  4. Focal hypermetabolic activity associated with soft tissue prominence along the left lateral aspect of the distal sigmoid colon. Underlying colonic lesion is not excluded. Attention on follow-up is recommended. Finding could be further evaluated with colonoscopy if clinically appropriate.    R Shoulder XR 12/15/22  IMPRESSION:  No fracture. Focal lytic glenoid lesion with high uptake seen on recent PET is barely visible on x-ray. Mild degenerative changes.    Above findings reviewed    ASSESSMENT: Stephen [name] is a 72 year old male with a history of metastatic urothelial carcinoma, OA, HTN, MDD, gout referred to Palliative Care clinic for symptom management and psychosocial  support.    Medical chart reviewed in detail.  Above labs reviewed and notable for anemia, elevated creatinine (Cr 1.42), elevated alkaline phosphatase  Above diagnostic images and reports reviewed by me personally and notable for left percutaneous nephrostomy, nonspecific soft tissue thickening and inflammatory stranding surrounding the left renal collecting systema nd proximal left ureter; diffuse marrow hypermetabolic activity     Stephen [name] was seen today for pain.    Diagnoses and all orders for this visit:    Metastatic urothelial carcinoma (CMS-HCC)    Hydronephrosis, unspecified hydronephrosis type    Chronic midline low back pain with right-sided sciatica  -     naloxone (NARCAN) 4 mg/0.1 mL nasal spray; For suspected opioid overdose, call 911! Then spray once in one nostril. Repeat after 3 minutes if no or minimal response using a new spray in other nostril.    Chronic right shoulder pain    Muscle spasm    Cancer related pain  -     methadone (DOLOPHINE) 5 MG tablet; Take 1 tablet (5 mg) by mouth every 12 hours.  -     CURES Review Documentation - I Reviewed CURES    Insomnia, unspecified  type    Nausea    At risk for constipation  -     senna (SENOKOT) 8.6 MG tablet; Take 2 tablets (17.2 mg) by mouth daily.    Palliative care by specialist    Had a long discussion with patient today about his pain management options.  He has a great deal of frustration about having had trials of several medications for his R shoulder pain in particular without much benefit and had to be redirected quite a bit during evaluation today as he would perseverate on his frustration and repeat himself quite a bit.  It's unclear to me how much oxycodone IR he has been taking because he has trouble providing adequate history, but I estimate he was taking oxycodone IR 10mg  4 to 6 tablets total per day up until 3 days ago when he stopped taking it.  He was started on pregabalin 100mg  PO daily and duloxetine 20mg  PO daily but has difficulty telling me if they've helped or not.  He has difficulty telling me if other opioids such as hydromorphone or Fentanyl have been more helpful for him in the past (although there is a hydromorphone shortage right now which may limit our options).  Discussed with patient that there does seem to be a significant neuropathic component to his pain.  It also sounds like he would benefit from a long acting pain medication.  We opted to start methadone.  He had an EKG at an outside facility recently that I was able to locate on the EMR, with a normal QTc.  He is aware that we will need to check EKG each time we change the methadone dose for his safety.    PLAN:  - start methadone 5mg  PO q12h.  Last Qtc = 364 on 12/26/22 EKG.  Recheck EKG after 1 week  - increase oxycodone IR to 10mg  1.5 tablets to 2 tablets (15mg  to 20mg ) PO q4h PRN pain  - continue acetaminophen 1000mg  PO q8h PRN pain  - continue tizanidine 2mg  2 tablets (4mg ) PO q8h PRN muscle spasms  - CURES reviewed  - Narcan PRN prescribed today and we discussed how/when to use in emergencies  - continue pregabalin 100mg  PO daily, could consider  increasing in the future if patient amenable, monitor renal function closely  -  continue duloxetine 20mg  PO daily  - start Senna 2 tablets PO daily  - continue prochlorperazine 10mg  PO q6h PRN nausea  - continue trazodone 50mg  1 to 1.5 tablets (50mg  to 75mg ) PO nightly PRN insomnia  - continue follow up with Pain Management  - Agree with Radiation Oncology evaluation  - please mychart message or call the palliative care clinic regarding symptom management    Follow up in palliative care clinic on 02/27/23 at 8 AM in person at [facility] (60 minutes)    Patient evaluated with the following [name] Service Members:  Caralee Ates, RN   Marko Stai, LCSW    Thank you for your consult.    Shea Javeah Loeza, MD  Attending Physician  [facility] Palliative Care Service    Total Attending time 75 min:  75 min spent performing physical examination, counseling patient, and coordinating their care with medical team.

## 2023-01-24 NOTE — Telephone Encounter (Signed)
Insurance has been updated

## 2023-01-25 NOTE — Goals of Care (Signed)
Advance Care Planning      With their permission, I had a discussion with Stephen [name] today regarding their goals of care.     The patient is willing to engage in goals of care discussion.    Regarding understand of their illness, the patient has a good understanding of their illness and prognosis.    They define quality of life as Health perception (feeling healthy & not limited), Relationships.    Their hopes are: Life extension, "to sustain life beyond what could be anticipated in the absence of treatment".    Overall, the plan of care is: Improve function.    Details: Stephen [name] shares today that his understanding of his prognosis of his urothelial carcinoma is 1-2 years, "I could make it until Christmas."  He has a lot of distress about his pain today which occupied most of our time, so we did not extensively discuss advance care planning topics.  He did name his sister Stephen [name] as his Media planner.  He does have an advance directive on file in the chart which we will review at subsequent visits.    Patient's [place] (aka surrogate decision maker): Stephen [name] (sister)     Patient has the following documents on file: advance directive.        Total time spent face to face with patient and/or surrogate in Advance Care Planning: 5 min.

## 2023-01-25 NOTE — Progress Notes (Signed)
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    Stage: Cancer Staging   Ureter malignant neoplasm, left (CMS-HCC)  Staging form: Renal Pelvis And Ureter, AJCC 8th Edition  - Clinical: Stage IV (cTX, cN0, pM1) - Signed by Doreen Beam, MD on 12/01/2022    Oncologic History:    04/22/21  CTU: filling defect in L renal pelvis  05/05/21  Cysto with L ureteroscopy revealed a L papillary tumor, biopsied: mixed high (30%) and low grade urothelial carcinoma  06/16/21  Repeat cysto, papillary tumor: pathology low grade papillary [place]  09/xx/22  Completed induction with mitogel (6 cycles)  08/19/21  HGTa of the bladder, no muscle  08/26/21  Repeat TURBT: NED, muscle identified  10/26/21  MRU: mild thickening in the L renal pelvis  12/24/21  Cysto/L ureteroscopy: dysplasia in the bladder; no disease in L ureter  05/06/22  L PCN placed  06/01/22  TURBT and L ureteroscopy: NED, stricture in place, stent placed  09/29/22  TURBT and L ureteroscopy: NED  10/22/22  MRU: Mild diffuse thickening in the L ureter/renal pelvis. Multiple osseous lesions involving the bilateral iliac crest and right acetabulum concerning for osseous metastatic disease.   11/26/22  PET/CT: diffuse osseous FDG activity with discrete osseous lesions concerning for metastatic disease.  12/22/22  L iliac crest biopsy: metastatic urothelial carcinoma  01/09/23  Seen by ortho, recommend palliative XRT  01/17/23  Seen by rad onc, simulation planned for 2/02  01/20/23  C1D1 EV 1.25 mg/kg D1,8 of 21 day cycle + pembrolizumab 200 mg IV D1 of 21 day cycle      Interim History:  Presents today prior to D8 EV planned for 2/09  Still not doing well today  Saw palliative care on 2/05 - started on methadone 45m 518mQ12 hrs  Says he's taking "too many pills" so he stopped his pain meds and nausea meds a few days ago  Reports  constipation this week  Sig fatigue, feels very weak; walking with cane assist   [facility]palliative xrt to R glenohumeral met) - start date?  Seen by IR, scheduled for PCN exchange 2/14  Port placement scheduled 2/26  Constipated, taking senna prn  No neuropahty - has some cramps in LE  He's currently staying with his sister, but he thinks he's going to need to find a new place.  He's been contacted by Social Work and our patient navigator, LiVito Bergeror additional support.     Review of Systems:   A complete ROS was performed and is negative except as documented above.     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 40 tablet 0    allopurinol (ZYLOPRIM) 100 MG tablet Take 1 tablet (100 mg) by mouth daily. 30 tablet 0    DULoxetine (CYMBALTA) 20 MG CR capsule Take 1 capsule (20 mg) by mouth daily. 90 capsule 3    [DISCONTINUED] gabapentin (NEURONTIN) 100 MG capsule Take 1 capsule (100 mg) by mouth 3 times daily for 7 days. Start taking after your procedure is completed. 21 capsule 0    ketotifen (ALAWAY) 0.025 % ophthalmic solution Place 1 drop into both eyes 2 times daily.      Loratadine (ALAVERT PO) Take 10 mg by mouth daily.      methadone (DOLOPHINE) 5 MG tablet Take 1 tablet (5 mg) by mouth every 12 hours. 15 tablet 0    Multiple Vitamins-Minerals (MULTIVITAMIN WITH MINERALS) TABS tablet Take 1 tablet by mouth  daily. 30 tablet 1    naloxone (NARCAN) 4 mg/0.1 mL nasal spray For suspected opioid overdose, call 911! Then spray once in one nostril. Repeat after 3 minutes if no or minimal response using a new spray in other nostril. 2 each 0    [DISCONTINUED] naloxone (NARCAN) 4 mg/0.1 mL nasal spray For suspected opioid overdose, call 911! Then spray once in one nostril. Repeat after 3 minutes if no or minimal response using a new spray in other nostril. 2 each 0    oxyCODONE (ROXICODONE) 10 MG tablet Take 1.5 tablets to 2 tablets (51m to 275m by mouth every 4 hours as needed for pain 90 tablet 0    [DISCONTINUED] oxyCODONE (ROXICODONE) 10 MG tablet Take 1 tablet (10 mg) by mouth every 6 hours as needed for Severe Pain (Pain Score 7-10) for up to 30 days. 90 tablet 0    pregabalin (LYRICA) 100 MG capsule Take 1 capsule (100 mg) by mouth at bedtime. 30 capsule 2    prochlorperazine (COMPAZINE) 10 MG tablet Take 1 tablet (10 mg) by mouth every 6 hours as needed (Nausea/Vomiting). 30 tablet 5    senna (SENOKOT) 8.6 MG tablet Take 2 tablets (17.2 mg) by mouth daily. 180 tablet 2    tamsulosin (FLOMAX) 0.4 MG capsule TAKE 1 CAPSULE BY MOUTH EVERY DAY 90 capsule 1    tizanidine (ZANAFLEX) 2 MG tablet Take 2 tablets (4 mg) by mouth every 8 hours as needed (spasm). 90 tablet 0    [DISCONTINUED] tizanidine (ZANAFLEX) 2 MG tablet Take 1 tablet (2 mg) by mouth every 8 hours as needed (spasm). (Patient taking differently: Take 2 tablets (4 mg) by mouth every 8 hours as needed (spasm).) 40 tablet 0    traZODone (DESYREL) 50 MG tablet Take 0.5 tablets (25 mg) by mouth at bedtime. 30 tablet 0    triamcinolone (KENALOG) 0.1 % cream Apply 1 Application. topically 2 times daily. Apply a thin layer as directed       No current facility-administered medications on file prior to visit.     Physical Examination:   01/26/23  0959   BP: 132/78   Pulse: 78   Temp: 97.6 F (36.4 C)   Resp: 18   SpO2: 98%     ECOG PFS 1  General: Looking a little  bit better today but still fatigued???????. Walks with a cane. Limited mobility of R arm d/t know osseous metastasis.  HEENT: Extraocular movements intact. Moist mucous membranes without oral lesions.  Neck: No thyromegaly or lymphadenopathy.   Chest: Clear to ausculation bilaterally. No wheezes, rales or rhonchi.  Cardiac: Regular rate and rhythm without any murmurs, rubs or gallops.   Abdomen: Abd soft, nontender without any hepatosplenomegaly.   Extremities: No clubbing, cyanosis, or edema.  Skin: No rashes or lesions.   Neurological: Awake, alert and oriented x 3. Attention, language and memory are intact. Strength and sensation are grossly intact.   L PCN draining clear, yellow urine. Clean dressing in place.     Labs: Following labs reviewed  Lab Results   Component Value Date    WBC 7.5 01/19/2023    RBC 4.20 (L) 01/19/2023    HGB 12.2 (L) 01/19/2023    HCT 37.3 (L) 01/19/2023    MCV 88.8 01/19/2023    MCHC 32.7 01/19/2023    RDW 14.5 (H) 01/19/2023    PLT 257 01/19/2023    MPV 9.9 01/19/2023      Lab Results   Component Value Date    BUN 27 (H) 01/19/2023    CREAT 1.42 (H) 01/19/2023    CL 104 01/19/2023    NA 141 01/19/2023    K 4.8 01/19/2023    [place] 9.3 01/19/2023    TBILI 0.46 01/19/2023    ALB 4.4 01/19/2023    TP 7.9 01/19/2023    AST 16 01/19/2023    ALK 489 (H) 01/19/2023    BICARB 26 01/19/2023    ALT 8 01/19/2023    GLU 112 (H) 01/19/2023        Assessment:  Kiya Elek is a 72 year old male with OA, HTN, MDD with L UTUC with metastatic disease [id number]), with osseous mets (Pet/CT with diffuse enhancement and discrete lesions).     No new labs to review for today's clinic.     No recent imaging to review for today's appointment.     Patient presents today prior to D8 EV.  Still struggling with a variety of issues, however, we have a comprehensive plan in place to address the following:  Pain management per Palliative Care - pt encouraged to restart methadone/pain meds per their  recommendations. Pill box to facilitate med organization. Palliative XRT to R glenohumeral met s/p SIM on 2/07; possible start date next week.   PCN exchange planned for 2/14. Suspect nausea may be due to constipation - start docusate bid and cont senna for BM's.  Port placement planned for 2/26.  RTC in 2 weeks with Dr. Nicole Kindred prior to C2.         Advanced Urothelial Carcinoma, [id number]  - Labs today - CBC/CMP  - NGS on tissue - bone biopsy  - Port placement planned 2/26  - C1D8 EV planned 2/09  - Treatment plan:  --- Enfortumab vedotin 1.25 mg/kg D1, D8 Q21d  --- Pembrolizumab 200 mg D1 Q21d  --- prn anti-nausea meds  --- prn antidiarrheal medications  --- prn topical skin care  --- ED precautions  - RTC with Dr. Nicole Kindred in 2 weeks prior to C2  - consider adding zometa given significnt bone disease    R shoulder pain  - 2/2 metastatic lesion  - seen by ortho 1/22  - seen by rad onc 1/30; SIM 2/07  - seen by palliative care team (Dr. Fannie Knee) 2/05     Cancer related pain  - seen by palliative care 2/05 prescribed:   Start methadone 75m 1 tab Q12hrs  Increase oxycodone 163m1.5 tabs to 2 tabs po Q4h prn pain  Continue tizanidine 54m24m 2 tabs po Q8h prn muscle spasm  Continue duloxetine  8m po daily  Start senna 2 tabs daily  Cont prochlorperazine 158mpo Q6h prn nausea  Continue trazadone 5045m to 1.5 tabs po prn insomnia  Start senna 8.6 mg - 2 tabs po daily   - offered oxycodone but he was not interested in this    L PCN, initially placed 04/2022  - tube exchange planned 2/14  - draining well, will monitor    RTC 2 weeks with Dr. SteNicole Kindred   Reviewed plan with the patient.  All questions answered to patient's satisfaction.  Patient verbalized understanding and agreement with above plan.      Today's visit lasted 40 minutes - including charting, communication with other doctors or caregivers, review of labs, pertinent imaging and test results; treatment planning including note completion, order entry, and  coordination of care - and consisted more than 50% of time in the face-to-face patient counseling.       ReeSunday CornA-C  Senior Physician Assistant  [facility]    Supervising Physician:  Dr. TylThomes Lolling   [place] [facility]ufkinA [phone]

## 2023-01-26 ENCOUNTER — Encounter (HOSPITAL_BASED_OUTPATIENT_CLINIC_OR_DEPARTMENT_OTHER): Payer: Self-pay | Admitting: Physician Assistant

## 2023-01-26 ENCOUNTER — Ambulatory Visit: Payer: Medicare Other | Attending: Physician Assistant | Admitting: Physician Assistant

## 2023-01-26 VITALS — BP 132/78 | HR 78 | Temp 97.6°F | Resp 18 | Ht 67.0 in | Wt 181.3 lb

## 2023-01-26 DIAGNOSIS — R530 Neoplastic (malignant) related fatigue: Secondary | ICD-10-CM | POA: Insufficient documentation

## 2023-01-26 DIAGNOSIS — R110 Nausea: Secondary | ICD-10-CM | POA: Insufficient documentation

## 2023-01-26 DIAGNOSIS — R52 Pain, unspecified: Secondary | ICD-10-CM | POA: Insufficient documentation

## 2023-01-26 DIAGNOSIS — C7910 Secondary malignant neoplasm of unspecified urinary organs: Secondary | ICD-10-CM | POA: Insufficient documentation

## 2023-01-26 MED ORDER — DOCUSATE SODIUM 100 MG OR CAPS
100.00 mg | ORAL_CAPSULE | Freq: Two times a day (BID) | ORAL | 2 refills | Status: DC | PRN
Start: 2023-01-26 — End: 2023-03-24

## 2023-01-26 NOTE — Patient Instructions (Addendum)
Patient Instructions:    - Day 8 infusion tomorrow, 2/9, as scheduled     - PCN exchange on 2/14, as scheduled    - Follow-up with Radiation Oncology to start palliative XRT to right shoulder    - Resume pain meds per palliative care team    - Get a pill box for assistance with medication management    - Continue Senna and start Docusate (colace) for assistance with constipation    - Return 2/22,as scheduled, with Dr. Nicole Kindred    When to call:    Nausea or vomiting unrelieved by medication    Vomiting accompanied by inability to keep down food or fluids    Diarrhea (3 or more episodes within 24 hours)    Fever >100 F    Chills    Difficulty with eating or drinking    Unrelieved pain    Chest pain or pressure    Shortness of breath or difficulty breathing/new persistent cough  -Rash/itching   -Abdominal pain/discomfort   -Bloody stools   -Persistent cough   -Blurry vision   -Grey stools  -Muscle weakness  -Tinging or numbness of the fingers or toes  -Neurologic changes      *Anything you are concerned about, please do not hesitate to reach out the nurse Danielle at [phone]. I am available M-F 8:00 am - 4:30 pm      *After hours number to call [phone] and ask to speak to the Oncologist on-call    Have questions about Advanced Care Planning?  PREPARE ([email or web address])       Future Appointments   Date Time Provider Ar[place]   01/27/2023  8:30 AM FAST TRACK MUC FAST MUC   01/27/2023 10:30 AM MUC INFUSION/CTI MUC Onc Chem MUC   02/01/2023 10:30 AM LJ ANGIO 1 THH IR [place]   02/09/2023 12:00 PM Doreen Beam, MD [facility] [facility]   02/10/2023  8:15 AM FAST TRACK MUC FAST MUC   02/10/2023 10:15 AM MUC INFUSION/CTI MUC Onc Chem MUC   02/13/2023  9:00 AM LJ ANGIO 1 THH IR [place]   02/17/2023  8:15 AM FAST TRACK MUC FAST MUC   02/17/2023 10:15 AM MUC INFUSION/CTI MUC Onc Chem MUC   02/27/2023  8:00 AM Leisa Lenz, MD [facility] [place]   03/03/2023  9:00 AM Carlota Raspberry, MD [facility] [place] Resource Website:  [email or web address]    Contact Information:   Thomes Lolling, MD   Assistant Professor of [facility]  Genitourinary Malignancy    Sunday Corn, M.S., PA-C  Sr. Physician Assistant    Clinical questions or concerns  Oncology Nurse Case Manager   [name] RN, BSN  T: [phone]  Department of Urology   [facility]  [place], [place] [id number]    Dr. Nicole Kindred Follow Up Appointment Assistance  Administrative Assistant   Fortunato Curling  T: [phone]  F: [phone]    Monday-Friday 8:00- 4:30p.m. (Closed Holidays and Weekends)  Please listen to message carefully for daily information.   Leave a detailed message with your name, medical record number and   telephone number. Messages are usually returned within 24 hours.     [place] Phone List     AFTER HOURS EMERGENCY NUMBER   Ask for the Oncologist Physician On-Call.  [place] Scheduling line: [phone]  Call for information about our new location and to schedule or cancel appointments.    [facility] Scheduling line: [phone]   Call to schedule your [name], CT Scans or MRI studies.    PET Scan Scheduling line: [phone]  Call to schedule your PET scan    Prostate MRI Scheduling line: [phone] or [phone]  Call to schedule your prostate MRI.   Performed at the [facility] ([place]).    Nuclear Medicine [place] [phone]   Call to schedule your bone scan, [facility] [place] [place]: [place]   Call to schedule, cancel, or reschedule chemotherapy appointments.  Infusion Center Hours--- Monday-Friday 7:00-7:30 p.m.; Sat-Sun 8:00-4:30pm.    [place] ([place]): [phone]  Monday-Friday 730-530 p.m.; Closed  Weekends    Radiation Oncology: [phone]    Call to schedule, cancel, or reschedule radiation appointments    Social Worker  [place]: Agnes Lawrence, [place]    T: [phone]  [place]: Omar Person, LCSW  T: [phone]    Financial Counselors   [phone] at [facility]   [phone] or [phone]      [facility]  [address] [place] [address]    -You can also reach [name] by [place]. To set up your MyChart please see the following site: [email or web address]  -For cancer support services, please visit out Patient and [place] support services, please visit:  -[email or web address]  -For other support from the [place], please visit: [email or web address]    Thank you for allowing [name] to participate in your care today! We value your feedback as we strive to provide every patient with an exceptional care experience.     You may receive a patient satisfaction survey in the mail in the next few weeks. Please fill out the survey upon receipt and return it in the self-addressed envelope. Your feedback will be used to help improve the experience for all our patients at [facility].     If you want to share a positive experience you had at our facility please call We Listen at [phone]. If you have any suggestions on how we can improve our services please call the [facility] leadership team at [phone].    For medical questions or emergencies, please call your physician's office or 911. Thank you!

## 2023-01-27 ENCOUNTER — Ambulatory Visit (HOSPITAL_BASED_OUTPATIENT_CLINIC_OR_DEPARTMENT_OTHER): Admit: 2023-01-27 | Discharge: 2023-01-27 | Disposition: A | Discharge status: Home Health | Payer: Medicare Other

## 2023-01-27 ENCOUNTER — Ambulatory Visit (HOSPITAL_BASED_OUTPATIENT_CLINIC_OR_DEPARTMENT_OTHER): Payer: Medicare Other

## 2023-01-27 ENCOUNTER — Ambulatory Visit
Admission: RE | Admit: 2023-01-27 | Discharge: 2023-01-27 | Disposition: A | Discharge status: Home / Self Care | Payer: Medicare Other | Attending: Hematology & Oncology | Admitting: Hematology & Oncology

## 2023-01-27 VITALS — BP 120/58 | HR 75 | Temp 98.3°F | Resp 16 | Ht 67.0 in | Wt 181.4 lb

## 2023-01-27 DIAGNOSIS — Z5112 Encounter for antineoplastic immunotherapy: Secondary | ICD-10-CM | POA: Insufficient documentation

## 2023-01-27 DIAGNOSIS — C662 Malignant neoplasm of left ureter: Secondary | ICD-10-CM

## 2023-01-27 LAB — COMPREHENSIVE METABOLIC PANEL, BLOOD
ALT (SGPT): 12 U/L (ref 0–41)
AST (SGOT): 27 U/L (ref 0–40)
Albumin: 4.1 g/dL (ref 3.5–5.2)
Alkaline Phos: 548 U/L — ABNORMAL HIGH (ref 40–129)
Anion Gap: 10 mmol/L (ref 7–15)
BUN: 18 mg/dL (ref 8–23)
Bicarbonate: 31 mmol/L — ABNORMAL HIGH (ref 22–29)
Bilirubin, Tot: 0.85 mg/dL (ref <1.2)
Calcium: 9.2 mg/dL (ref 8.5–10.6)
Chloride: 99 mmol/L (ref 98–107)
Creatinine: 1.21 mg/dL — ABNORMAL HIGH (ref 0.67–1.17)
Glucose: 124 mg/dL — ABNORMAL HIGH (ref 70–99)
Potassium: 4.4 mmol/L (ref 3.5–5.1)
Sodium: 140 mmol/L (ref 136–145)
Total Protein: 7.6 g/dL (ref 6.0–8.0)
eGFR Based on CKD-EPI 2021 Equation: >60 mL/min/{1.73_m2}

## 2023-01-27 LAB — CBC WITH DIFF, BLOOD
ANC-Automated: 6.4 10*3/uL (ref 1.6–7.0)
Abs Basophils: 0 10*3/uL (ref <0.2)
Abs Eosinophils: 0.1 10*3/uL (ref 0.0–0.5)
Abs Lymphs: 1.2 10*3/uL (ref 0.8–3.1)
Abs Monos: 0.8 10*3/uL (ref 0.2–0.8)
Basophils: 0 %
Eosinophils: 1 %
Hct: 35.9 % — ABNORMAL LOW (ref 40.0–50.0)
Hgb: 12 gm/dL — ABNORMAL LOW (ref 13.7–17.5)
Lymphocytes: 14 %
MCH: 28.8 pg (ref 26.0–32.0)
MCHC: 33.4 g/dL (ref 32.0–36.0)
MCV: 86.3 um3 (ref 79.0–95.0)
MPV: 9.6 fL (ref 9.4–12.4)
Monocytes: 9 %
Plt Count: 268 10*3/uL (ref 140–370)
RBC: 4.16 10*6/uL — ABNORMAL LOW (ref 4.60–6.10)
RDW: 14.6 % — ABNORMAL HIGH (ref 12.0–14.0)
Segs: 76 %
WBC: 8.5 10*3/uL (ref 4.0–10.0)

## 2023-01-27 MED ORDER — SODIUM CHLORIDE 0.9 % IV SOLN
100.0000 mg | Freq: Once | INTRAVENOUS | Status: AC
Start: 2023-01-27 — End: 2023-01-27
  Administered 2023-01-27: 100 mg via INTRAVENOUS
  Filled 2023-01-27: qty 60

## 2023-01-27 MED ORDER — ONDANSETRON HCL 4 MG/2ML IV SOLN
8.0000 mg | Freq: Once | INTRAMUSCULAR | Status: AC
Start: 2023-01-27 — End: 2023-01-27
  Administered 2023-01-27: 8 mg via INTRAVENOUS
  Filled 2023-01-27: qty 4

## 2023-01-27 MED ORDER — SODIUM CHLORIDE 0.9 % IV SOLN
Freq: Once | INTRAVENOUS | Status: AC
Start: 2023-01-27 — End: 2023-01-27

## 2023-01-27 NOTE — Interdisciplinary (Signed)
Patient arrived ambulatory in stable condition. Denies nausea, vomiting, diarrhea, or constipation.   Denies fevers or recent illness    PIV started to: right arm 22g                      Positive blood return. #1 attempt. Flushed with 22m NS and capped.   Labs drawn: 0820    Patient discharged ambulatory in stable condition to lobby awaiting lab results.   Charge Triage RN to review labs

## 2023-01-27 NOTE — Interdisciplinary (Signed)
Chemotherapy Nursing Note - [place]    Stephen [name] is a 72 year old male who presents for chemotherapy cycle 1, day(s) 8 of Padcev.    Current Chemotherapy Signed Informed Consent verified/obtained yes 01/06/23    Pre-treatment nursing assessment:  On arrival, pt said he was feeling very fatigued and weak. He woke up feeling like "he was in the twilight zone." Has been very nauseous, taking compazine at home. Has been in a lot of pain recently. Pt saw PA Reena Cherry yesterday, who told him to keep taking his pain medicine and to see palliative, as per pt. Pt mentioned his arthritis in his throat, making it feel tight at times. Pt states he does not have trouble breathing or get short of breath. Pt has been constipated, started taking stool softeners yesterday with positive effect. C/o dry eyes. States he has not had an appetite and hasn't been eating much but has been doing well with hydrating, has been emptying his nephrostomy bag often. States he noticed blood in his bag a few days ago that went away quickly. He is having muscle spasms by his R knee that he says is causing him to fall. [place] a few days ago in his room, he is staying with his sister currently. He states he did not injure himself or hit his head.     DtNicole Kindred paged after pt explained all of this to me. Dr. Nicole Kindred spoke with myself and the patient on the phone. Pt and Dr were in agreement to proceed with treatment today and see how he feels afterwards. Pt is starting radiation treatment on Monday. States he did not want to proceed if he is still feeling this way, Dr. Nicole Kindred encouraged him to proceed and pt was in agreement.     Patient met treatment parameters.    Lab Results   Component Value Date    ABSNEUTRO 6.4 01/27/2023    WBC 8.5 01/27/2023    RBC 4.16 (L) 01/27/2023    HGB 12.0 (L) 01/27/2023    HCT 35.9 (L) 01/27/2023    MCV 86.3 01/27/2023    MCHC 33.4 01/27/2023    RDW 14.6 (H) 01/27/2023    PLT 268 01/27/2023        Lab Results   Component Value Date    NA 140 01/27/2023    K 4.4 01/27/2023    CL 99 01/27/2023    BICARB 31 (H) 01/27/2023    BUN 18 01/27/2023    CREAT 1.21 (H) 01/27/2023    GLU 124 (H) 01/27/2023    Mentone 9.2 01/27/2023           Lab Results   Component Value Date    AST 27 01/27/2023    ALT 12 01/27/2023    GGT 131 (H) 01/17/2022    LDH 172 01/19/2022    ALK 548 (H) 01/27/2023    TP 7.6 01/27/2023    ALB 4.1 01/27/2023    TBILI 0.85 01/27/2023    DBILI <0.2 01/19/2022        Vitals:    01/27/23 0819 01/27/23 1025 01/27/23 1133 01/27/23 1314   BP:  119/67  (!) 120/58   BP Location:  Left arm  Left arm   BP Patient Position:  Sitting  Semi-Fowlers   Pulse:  70  75   Resp:  18  16   Temp:  97 F (36.1 C)  98.3 F (36.8 C)   TempSrc:  Temporal  Temporal   SpO2:  96%  98%   Weight: 82.3 kg (181 lb 7 oz) 82.3 kg (181 lb 7 oz) 82.3 kg (181 lb 7 oz)    Height: 5' 7"$  (1.702 m) 5' 7"$  (1.702 m) 5' 7"$  (1.702 m)      Pain Score: 10  Body surface area is 1.97 meters squared.  Body mass index is 28.42 kg/m.    ECOG  ECOG: 2       Chemotherapy:  Ezel Kope tolerated treatment well.    Post blood return: Brisk  IV access post infusion: NS    Medications   sodium chloride 0.9% infusion (0 mL IntraVENOUS Stopped 01/27/23 1315)   ondansetron (ZOFRAN) injection 8 mg (8 mg IntraVENOUS Given 01/27/23 1112)   enfortumab vedotin-ejfv (PADCEV) 100 mg in sodium chloride 0.9 % 100 mL chemo infusion (0 mg IntraVENOUS IV Stop 01/27/23 1231)         Patient Education  Learner: Patient  Barriers to learning: No Barriers  Readiness to learn: Acceptance  Method: Explanation    Treatment Education:       Signs and symptoms of infection, bleeding, adverse reaction(s), symptom control, and when to notify MD.    Patient instructed on post-treatment care and precautions specific to drug(s) administered.   If applicable, education given on vesicant administration risk and signs and symptoms of extravasation (redness, pain, swelling, pressure  at IV site) to report immediately to the chemotherapy RN.    Fall Prevention Education: Instructed patient to call for assistance, call light within reach.   Pain Education: Patient instructed to contact nurse if pain should develop or if their current pain therapy becomes ineffective.       Treatment education provided to patient: yes    Response: Verbalizes understanding    Discharge Plan  Discharge instructions given to patient.  Future appointments:date given and reviewed with treatment plan. 02/10/23  Discharge Mode: Ambulatory with cane  Discharge Time: 1325    Accompanied by: Self  Discharged To: Home

## 2023-01-30 ENCOUNTER — Telehealth: Payer: Self-pay

## 2023-01-30 ENCOUNTER — Other Ambulatory Visit (HOSPITAL_COMMUNITY): Payer: Self-pay

## 2023-01-30 NOTE — Telephone Encounter (Signed)
Spoke to patient and inform him that his Silenafil Citrate 22m required PA that was denied. I told him about discount card and he said [facility] put discount card on there form him and he got it for under 20.00 and he said he was fine with that because he do not use them but PRN.

## 2023-01-30 NOTE — Telephone Encounter (Signed)
Noted. Patient aware per note.

## 2023-02-01 ENCOUNTER — Other Ambulatory Visit (HOSPITAL_BASED_OUTPATIENT_CLINIC_OR_DEPARTMENT_OTHER): Payer: Self-pay

## 2023-02-01 DIAGNOSIS — N1330 Unspecified hydronephrosis: Secondary | ICD-10-CM | POA: Insufficient documentation

## 2023-02-02 ENCOUNTER — Ambulatory Visit (HOSPITAL_BASED_OUTPATIENT_CLINIC_OR_DEPARTMENT_OTHER): Payer: Medicare Other | Admitting: Hematology & Oncology

## 2023-02-02 ENCOUNTER — Encounter (HOSPITAL_BASED_OUTPATIENT_CLINIC_OR_DEPARTMENT_OTHER): Payer: Self-pay

## 2023-02-02 ENCOUNTER — Telehealth (HOSPITAL_BASED_OUTPATIENT_CLINIC_OR_DEPARTMENT_OTHER): Payer: Self-pay

## 2023-02-02 NOTE — Telephone Encounter (Signed)
Called to schedule patient's neph tube check appointment. Per patient he is currently admitted at [facility] and just had his tube changed by them. He will call us back when it's time to schedule his next change.

## 2023-02-07 ENCOUNTER — Telehealth (HOSPITAL_BASED_OUTPATIENT_CLINIC_OR_DEPARTMENT_OTHER): Payer: Self-pay

## 2023-02-07 DIAGNOSIS — C662 Malignant neoplasm of left ureter: Secondary | ICD-10-CM

## 2023-02-07 NOTE — Telephone Encounter (Signed)
Dr. Nicole Kindred,     Stephen [name] is scheduled for infusion of Padcev/Keytruda on 2/23.     Orders are currently unsigned; signed orders are required to avoid cancellation of appointment.   If orders remain unsigned, the patient will be contacted 24 hours prior to their scheduled appointment notifying them of cancellation. The infusion appointment will then be rescheduled after orders are signed.     Patient has a scheduled clinic visit on 2/22.    Thank you,   Hardie Shackleton, RN  RN Liaison

## 2023-02-09 ENCOUNTER — Encounter (HOSPITAL_BASED_OUTPATIENT_CLINIC_OR_DEPARTMENT_OTHER): Payer: Self-pay | Admitting: Hematology & Oncology

## 2023-02-09 ENCOUNTER — Ambulatory Visit: Payer: Medicare Other | Attending: Hematology & Oncology | Admitting: Hematology & Oncology

## 2023-02-09 VITALS — BP 123/61 | HR 70 | Temp 97.8°F | Resp 16 | Ht 67.0 in | Wt 172.5 lb

## 2023-02-09 DIAGNOSIS — C7910 Secondary malignant neoplasm of unspecified urinary organs: Secondary | ICD-10-CM | POA: Insufficient documentation

## 2023-02-09 NOTE — Patient Instructions (Signed)
Patient Instructions:    -- Schedule your next follow up appointment with Dr. Nicole Kindred on 3/11 at the front desk on your way out, or by calling the Urology Scheduling line:  [phone].      -- Dr. Nicole Kindred has referred you to Nutrition:  [phone]          Have questions about Advanced Care Planning?  PREPARE ([email or web address])       Future Appointments   Date Time Provider [place]   02/10/2023  8:15 AM FAST TRACK MUC FAST MUC   02/10/2023 10:15 AM MUC INFUSION/CTI MUC Onc Chem MUC   02/13/2023  9:00 AM LJ ANGIO 1 [facility]H IR [place]   02/17/2023  8:15 AM FAST TRACK MUC FAST MUC   02/17/2023 10:15 AM MUC INFUSION/CTI MUC Onc Chem MUC   02/27/2023  8:00 AM Leisa Lenz, MD [facility] [place]   03/03/2023  9:00 AM Carlota Raspberry, MD [facility] [place] Resource Website:  [email or web address]    Contact Information:   Thomes Lolling, MD   Assistant Professor of [facility]  Genitourinary Malignancy      Sunday Corn, M.S., PA-C  Sr. Physician Assistant    Clinical questions or concerns  Oncology Nurse Case Manager   [name] RN, BSN  T: [phone]  Department of Urology   [facility]  [place], [place] [id number]    Dr. Nicole Kindred Follow Up Appointment Assistance  Administrative Assistant   Fortunato Curling  T: [phone]  F: [phone]    Monday-Friday 8:00- 4:30p.m. (Closed Holidays and Weekends)  Please listen to message carefully for daily information.   Leave a detailed message with your name, medical record number and   telephone number. Messages are usually returned within 24 hours.     [place] Phone List     AFTER HOURS EMERGENCY NUMBER   Ask for the Oncologist Physician On-Call.                   [place] Scheduling line: [phone]  Call for information about our new location and to  schedule or cancel appointments.    [facility] Scheduling line: [phone]   Call to schedule your [name], CT Scans or MRI studies.    PET Scan Scheduling line: [phone]  Call to schedule your PET scan    Prostate MRI Scheduling line: [phone] or [phone]  Call to schedule your prostate MRI.   Performed at the [place] ([place]).    Nuclear Medicine [place] [phone]   Call to schedule your bone scan, [facility] [place]: [place]   Call to schedule, cancel, or reschedule chemotherapy appointments.  Infusion Center Hours--- Monday-Friday 7:00-7:30 p.m.; Sat-Sun 8:00-4:30pm.    [place] ([place]): [phone]  Monday-Friday 730-530 p.m.; Closed Weekends    Radiation Oncology: [phone]    Call to schedule, cancel, or reschedule radiation appointments    Social Worker  [place]: Agnes Lawrence, [place]    T: [place]: Omar Person, LCSW  T: [phone]    Financial Counselors   [phone] at [facility]   [phone] or [phone]      [facility]  [id number] [phone]  [place], [place] [address]    -You can also reach [name] by [place]. To set up your MyChart please see the following site: [email or web address]  -For cancer support services, please visit out Patient and [place] support services, please visit:  [email or web address]  -For other support from the [place], please visit: [email or web address]              Thank you for allowing [name] to participate in your care today! We value your feedback as we strive to provide every patient with an exceptional care experience.     You may receive a patient satisfaction survey in the mail in the next few weeks. Please fill out the survey upon receipt and return it  in the self-addressed envelope. Your feedback will be used to help improve the experience for all our patients at [facility].     If you want to share a positive experience you had at our facility please call We Listen at [phone]. If you have any suggestions on how we can improve our services please call the [facility] leadership team at [phone].    For medical questions or emergencies, please call your physician's office or 911. Thank you!

## 2023-02-09 NOTE — Progress Notes (Signed)
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    Stage: Cancer Staging   Ureter malignant neoplasm, left (CMS-HCC)  Staging form: Renal Pelvis And Ureter, AJCC 8th Edition  - Clinical: Stage IV (cTX, cN0, pM1) - Signed by Doreen Beam, MD on 12/01/2022    Oncologic History:    04/22/21  CTU: filling defect in L renal pelvis  05/05/21  Cysto with L ureteroscopy revealed a L papillary tumor, biopsied: mixed high (30%) and low grade urothelial carcinoma  06/16/21  Repeat cysto, papillary tumor: pathology low grade papillary [place]  09/xx/22  Completed induction with mitogel (6 cycles)  08/19/21  HGTa of the bladder, no muscle  08/26/21  Repeat TURBT: NED, muscle identified  10/26/21  MRU: mild thickening in the L renal pelvis  12/24/21  Cysto/L ureteroscopy: dysplasia in the bladder; no disease in L ureter  05/06/22  L PCN placed  06/01/22  TURBT and L ureteroscopy: NED, stricture in place, stent placed  09/29/22  TURBT and L ureteroscopy: NED  10/22/22  MRU: Mild diffuse thickening in the L ureter/renal pelvis. Multiple osseous lesions involving the bilateral iliac crest and right acetabulum concerning for osseous metastatic disease.   11/26/22  PET/CT: diffuse osseous FDG activity with discrete osseous lesions concerning for metastatic disease.  12/22/22  L iliac crest biopsy: metastatic urothelial carcinoma  01/09/23  Seen by ortho, recommend palliative XRT  01/17/23  Seen by rad onc, simulation planned for 2/02  01/20/23  C1D1 EV 1.25 mg/kg D1,8 of 21 day cycle + pembrolizumab 200 mg IV D1 of 21 day cycle  02/11-02/19  Admitted to [facility] with AMS and resp failure 2/2 opiates.  Also treated for septic shock, AKI (Cr 2.5) and acute liver injury (AST/ALT 2800/2100).   S/p exchange L PCN.   02/07/23  start XRT to R Shoulder    Interim History:  Here prior to C2 EV+P  Was  hospitalized as above.  Pain in shoulder is a bit better - 6-7/10.  Nausea.  Not eating a lot.  Hasn't had a bowel movement.  He'd like to get some miralax.   Missing his radiation treatment today.   Not taking methadone.  Taking oxycodone prn.  He'd like a consultation with nutrition to talk about dietary supplements.   He'd like to get treatment tomorrow.     Review of Systems:   A complete ROS was performed and is negative except as documented above.     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 40 tablet 0    allopurinol (ZYLOPRIM) 100 MG tablet Take 1 tablet (100 mg) by mouth daily. 30 tablet 0    docusate sodium (  COLACE) 100 MG capsule Take 1 capsule (100 mg) by mouth 2 times daily as needed for Constipation. 30 capsule 2    DULoxetine (CYMBALTA) 20 MG CR capsule Take 1 capsule (20 mg) by mouth daily. 90 capsule 3    [DISCONTINUED] gabapentin (NEURONTIN) 100 MG capsule Take 1 capsule (100 mg) by mouth 3 times daily for 7 days. Start taking after your procedure is completed. 21 capsule 0    ketotifen (ALAWAY) 0.025 % ophthalmic solution Place 1 drop into both eyes 2 times daily.      Loratadine (ALAVERT PO) Take 10 mg by mouth daily.      methadone (DOLOPHINE) 5 MG tablet Take 1 tablet (5 mg) by mouth every 12 hours. 15 tablet 0    Multiple Vitamins-Minerals (MULTIVITAMIN WITH MINERALS) TABS tablet Take 1 tablet by mouth daily. 30 tablet 1     naloxone (NARCAN) 4 mg/0.1 mL nasal spray For suspected opioid overdose, call 911! Then spray once in one nostril. Repeat after 3 minutes if no or minimal response using a new spray in other nostril. 2 each 0    oxyCODONE (ROXICODONE) 10 MG tablet Take 1.5 tablets to 2 tablets ('15mg'$  to '20mg'$ ) by mouth every 4 hours as needed for pain 90 tablet 0    pregabalin (LYRICA) 100 MG capsule Take 1 capsule (100 mg) by mouth at bedtime. 30 capsule 2    prochlorperazine (COMPAZINE) 10 MG tablet Take 1 tablet (10 mg) by mouth every 6 hours as needed (Nausea/Vomiting). 30 tablet 5    senna (SENOKOT) 8.6 MG tablet Take 2 tablets (17.2 mg) by mouth daily. 180 tablet 2    tamsulosin (FLOMAX) 0.4 MG capsule TAKE 1 CAPSULE BY MOUTH EVERY DAY 90 capsule 1    tizanidine (ZANAFLEX) 2 MG tablet Take 2 tablets (4 mg) by mouth every 8 hours as needed (spasm). 90 tablet 0    traZODone (DESYREL) 50 MG tablet Take 0.5 tablets (25 mg) by mouth at bedtime. 30 tablet 0    triamcinolone (KENALOG) 0.1 % cream Apply 1 Application. topically 2 times daily. Apply a thin layer as directed       No current facility-administered medications on file prior to visit.     Physical Examination:   02/09/23  1131   BP: 123/61   Pulse: 70   Temp: 97.8 F (36.6 C)   Resp: 16   SpO2: 97%     ECOG PFS 1  General: Looking a little bit better today but still fatigued???????. Walks with a cane. Limited mobility of R arm d/t know osseous metastasis.  HEENT: Extraocular movements intact. Moist mucous membranes without oral lesions.  Neck: No thyromegaly or lymphadenopathy.   Chest: Clear to ausculation bilaterally. No wheezes, rales or rhonchi.    Cardiac: Regular rate and rhythm without any murmurs, rubs or gallops.   Abdomen: Abd soft, nontender without any hepatosplenomegaly.   Extremities: No clubbing, cyanosis, or edema.  Skin: No rashes or lesions.   Neurological: Awake, alert and oriented x 3. Attention, language and memory are intact. Strength and sensation are  grossly intact.   L PCN draining clear, yellow urine. Clean dressing in place.     Labs: Following labs reviewed  Lab Results   Component Value Date    WBC 8.5 01/27/2023    RBC 4.16 (L) 01/27/2023    HGB 12.0 (L) 01/27/2023    HCT 35.9 (L) 01/27/2023    MCV 86.3 01/27/2023    MCHC 33.4 01/27/2023  RDW 14.6 (H) 01/27/2023    PLT 268 01/27/2023    MPV 9.6 01/27/2023      Lab Results   Component Value Date    BUN 18 01/27/2023    CREAT 1.21 (H) 01/27/2023    CL 99 01/27/2023    NA 140 01/27/2023    K 4.4 01/27/2023    [place] 9.2 01/27/2023    TBILI 0.85 01/27/2023    ALB 4.1 01/27/2023    TP 7.6 01/27/2023    AST 27 01/27/2023    ALK 548 (H) 01/27/2023    BICARB 31 (H) 01/27/2023    ALT 12 01/27/2023    GLU 124 (H) 01/27/2023                Assessment:  Maximillion Aveni is a 72 year old male with OA, HTN, MDD with L UTUC with metastatic disease [id number]), with osseous mets (Pet/CT with diffuse enhancement and discrete lesions). FGFR-TACC mutation.  Started on EV+Pembro.    I personally reviewed labs as above  I personally reviewed imaging as above.    I reviewed records from outside hospital.  He essentially went in with narcotic-induced respiratory failure, AKI and ALI.  Labs  He is now doing much better.  He has recovered and would like treatment.  We will check his labs tomorrow and assuming labs are ok, we will proceed with treatment.  His pain is much better controlled.     Advanced Urothelial Carcinoma, [id number]  - Port placement planned 2/26  - Proceed with treatment pending labs, C2  - Treatment plan:  --- Enfortumab vedotin 1.25 mg/kg D1, D8 Q21d  --- Pembrolizumab 200 mg D1 Q21d  --- prn anti-nausea meds  --- prn antidiarrheal medications  --- prn topical skin care  --- ED precautions  - RTC 3 weeks  - consider adding zometa given significnt bone disease; needs vit D level checked and dental clearance    R shoulder pain  - 2/2 metastatic lesion  - seen by ortho 1/22  - started on radiation 2/20  - seen by  palliative care team (Dr. Fannie Knee) 2/05     Cancer related pain  - seen by palliative care 2/05 prescribed:   - follow up with palliative care after recent hospitalization to optimize pain management    L PCN, initially placed 04/2022  - tube exchange planned next exchange in 04/2023  - draining well, will monitor    RTC 3 weeks    This visit was spent addressing a complex, chronic illness that poses a threat to life or bodily function to this patient.  I independently reviewed the patient's history since prior visit, reviewed intervening labs and imaging, counseled the patient on the management of their chronic illness, and placed orders to be done prior to next visit.     Charlett Lango. Nicole Kindred, M.D.  Assistant Professor of [place], [place]  [address]  [place] [id number]

## 2023-02-10 ENCOUNTER — Encounter (HOSPITAL_BASED_OUTPATIENT_CLINIC_OR_DEPARTMENT_OTHER): Payer: Self-pay

## 2023-02-10 ENCOUNTER — Ambulatory Visit
Admission: RE | Admit: 2023-02-10 | Discharge: 2023-02-10 | Disposition: A | Discharge status: Home / Self Care | Payer: Medicare Other | Attending: Hematology & Oncology | Admitting: Hematology & Oncology

## 2023-02-10 ENCOUNTER — Ambulatory Visit (HOSPITAL_BASED_OUTPATIENT_CLINIC_OR_DEPARTMENT_OTHER): Admit: 2023-02-10 | Discharge: 2023-02-10 | Disposition: A | Discharge status: Home Health | Payer: Medicare Other

## 2023-02-10 VITALS — BP 136/59 | HR 67 | Temp 98.3°F | Resp 18 | Ht 67.0 in | Wt 171.7 lb

## 2023-02-10 DIAGNOSIS — Z936 Other artificial openings of urinary tract status: Secondary | ICD-10-CM | POA: Insufficient documentation

## 2023-02-10 DIAGNOSIS — C662 Malignant neoplasm of left ureter: Secondary | ICD-10-CM

## 2023-02-10 DIAGNOSIS — Z95828 Presence of other vascular implants and grafts: Secondary | ICD-10-CM | POA: Insufficient documentation

## 2023-02-10 DIAGNOSIS — Z5112 Encounter for antineoplastic immunotherapy: Secondary | ICD-10-CM | POA: Insufficient documentation

## 2023-02-10 LAB — COMPREHENSIVE METABOLIC PANEL, BLOOD
ALT (SGPT): 49 U/L — ABNORMAL HIGH (ref 0–41)
AST (SGOT): 26 U/L (ref 0–40)
Albumin: 3.9 g/dL (ref 3.5–5.2)
Alkaline Phos: 414 U/L — ABNORMAL HIGH (ref 40–129)
Anion Gap: 14 mmol/L (ref 7–15)
BUN: 12 mg/dL (ref 8–23)
Bicarbonate: 25 mmol/L (ref 22–29)
Bilirubin, Tot: 0.71 mg/dL (ref <1.2)
Calcium: 8.8 mg/dL (ref 8.5–10.6)
Chloride: 95 mmol/L — ABNORMAL LOW (ref 98–107)
Creatinine: 1.2 mg/dL — ABNORMAL HIGH (ref 0.67–1.17)
Glucose: 123 mg/dL — ABNORMAL HIGH (ref 70–99)
Potassium: 4.1 mmol/L (ref 3.5–5.1)
Sodium: 134 mmol/L — ABNORMAL LOW (ref 136–145)
Total Protein: 7.1 g/dL (ref 6.0–8.0)
eGFR Based on CKD-EPI 2021 Equation: >60 mL/min/{1.73_m2}

## 2023-02-10 LAB — CBC WITH DIFF, BLOOD
ANC-Automated: 4.9 10*3/uL (ref 1.6–7.0)
ANC-Instrument: 4.9 10*3/uL (ref 1.6–7.0)
Abs Basophils: 0.1 10*3/uL (ref <0.2)
Abs Eosinophils: 0.1 10*3/uL (ref 0.0–0.5)
Abs Lymphs: 1.3 10*3/uL (ref 0.8–3.1)
Abs Monos: 0.9 10*3/uL — ABNORMAL HIGH (ref 0.2–0.8)
Basophils: 1 %
Eosinophils: 1 %
Hct: 32.5 % — ABNORMAL LOW (ref 40.0–50.0)
Hgb: 11 gm/dL — ABNORMAL LOW (ref 13.7–17.5)
Imm Gran %: 1 % — ABNORMAL HIGH (ref <1)
Lymphocytes: 18 %
MCH: 28.7 pg (ref 26.0–32.0)
MCHC: 33.8 g/dL (ref 32.0–36.0)
MCV: 84.9 um3 (ref 79.0–95.0)
MPV: 9.2 fL — ABNORMAL LOW (ref 9.4–12.4)
Monocytes: 12 %
Plt Count: 458 10*3/uL — ABNORMAL HIGH (ref 140–370)
RBC: 3.83 10*6/uL — ABNORMAL LOW (ref 4.60–6.10)
RDW: 15.2 % — ABNORMAL HIGH (ref 12.0–14.0)
Segs: 67 %
WBC: 7.3 10*3/uL (ref 4.0–10.0)

## 2023-02-10 LAB — TSH, BLOOD: TSH: 1.79 u[IU]/mL (ref 0.27–4.20)

## 2023-02-10 LAB — CORTISOL, BLOOD: Cortisol: 15.5 ug/dL

## 2023-02-10 LAB — FREE THYROXINE, BLOOD: Free T4: 1.29 ng/dL (ref 0.93–1.70)

## 2023-02-10 MED ORDER — IBUPROFEN 200 MG OR TABS
400.0000 mg | ORAL_TABLET | Freq: Once | ORAL | Status: DC
Start: 2023-02-10 — End: 2023-02-11

## 2023-02-10 MED ORDER — SODIUM CHLORIDE 0.9 % IV SOLN
80.0000 mg | Freq: Once | INTRAVENOUS | Status: AC
Start: 2023-02-10 — End: 2023-02-10
  Administered 2023-02-10: 80 mg via INTRAVENOUS
  Filled 2023-02-10: qty 60

## 2023-02-10 MED ORDER — SODIUM CHLORIDE 0.9 % IV BOLUS
1000.0000 mL | INJECTION | Freq: Once | INTRAVENOUS | Status: AC
Start: 2023-02-10 — End: 2023-02-10
  Administered 2023-02-10: 1000 mL via INTRAVENOUS

## 2023-02-10 MED ORDER — ONDANSETRON HCL 4 MG/2ML IV SOLN
8.0000 mg | Freq: Once | INTRAMUSCULAR | Status: AC
Start: 2023-02-10 — End: 2023-02-10
  Administered 2023-02-10: 8 mg via INTRAVENOUS
  Filled 2023-02-10: qty 4

## 2023-02-10 MED ORDER — SODIUM CHLORIDE 0.9 % IV SOLN
1.0000 mg/kg | Freq: Once | INTRAVENOUS | Status: DC
Start: 2023-02-10 — End: 2023-02-10

## 2023-02-10 MED ORDER — SODIUM CHLORIDE 0.9 % IV SOLN
200.0000 mg | Freq: Once | INTRAVENOUS | Status: AC
Start: 2023-02-10 — End: 2023-02-10
  Administered 2023-02-10: 200 mg via INTRAVENOUS
  Filled 2023-02-10: qty 8

## 2023-02-10 MED ORDER — SODIUM CHLORIDE 0.9 % IV SOLN
Freq: Once | INTRAVENOUS | Status: AC
Start: 2023-02-10 — End: 2023-02-10

## 2023-02-10 NOTE — Interdisciplinary (Signed)
Patient arrived ambulatory with cane in stable condition. Denies nausea, vomiting, diarrhea, or constipation.   Denies fevers or recent illness    PIV started to: Right  wrist                     Positive blood return. #1 attempt. Flushed with 46m NS and capped.   Labs drawn: 0[place]   Patient discharged ambulatory in stable condition to lobby awaiting lab results.   Charge Triage RN to review labs

## 2023-02-10 NOTE — Interdisciplinary (Addendum)
Chemotherapy Nursing Note - Stephen [name]  72 year old  @ [place] for [place] and [place] ICD-9-CM   1. Ureter malignant neoplasm, left (CMS-HCC)  C66.2 189.2     Chief Complaint:  Line Care and Chemotherapy    Current Chemotherapy Signed Informed Consent: yes 01/06/23  Vital Signs:  Blood Pressure (Abnormal) 136/59 (BP Location: Right arm, BP Patient Position: Sitting)   Pulse 67   Temperature 98.3 F (36.8 C) (Temporal)   Respiration 18   Height '5\' 7"'$  (1.702 m)   Weight 77.9 kg (171 lb 11.8 oz)   Oxygen Saturation 100%   Body Mass Index 26.90 kg/m   Lab Results   Component Value Date    WBC 7.3 02/10/2023    RBC 3.83 (L) 02/10/2023    HGB 11.0 (L) 02/10/2023    HCT 32.5 (L) 02/10/2023    MCV 84.9 02/10/2023    MCHC 33.8 02/10/2023    RDW 15.2 (H) 02/10/2023    PLT 458 (H) 02/10/2023    MPV 9.2 (L) 02/10/2023      Lab Results   Component Value Date    AST 26 02/10/2023    ALT 49 (H) 02/10/2023    GGT 131 (H) 01/17/2022    LDH 172 01/19/2022    ALK 414 (H) 02/10/2023    TP 7.1 02/10/2023    ALB 3.9 02/10/2023    TBILI 0.71 02/10/2023    DBILI <0.2 01/19/2022     Pre-treatment Nursing Assessment:   Presents to Infusion Center alert, oriented, and ambulatory.  Denies fever, chills, or night sweats  Denies chest pain or palpitations  Denies respiratory symptoms including cough, shortness of breath, dyspnea on exertion  Denies vomiting, diarrhea, constipation.  Denies hematuria, dysuria, frequency, urgency.    Abnormal Findings:  Pt reports siome nausea which started when taking antibiotics last week.   Pt reports numbness/tingling in hands and feet that started before chemotherapy and has not worsened.  Pt reports arm pain due to cancer. APP ordered motrin but pt declined. Pt reported not drinking enough water due to pain in stomach which pt believes is due to antibiotics he started last week.   Access:  Pt's PIV from [facility] right wrist was occluded and would not flush.    PIV in Left Forearm inserted. Brisk blood return.   Chemotherapy:  Padcev '80mg'$   Keytruda '200mg'$   Treatment Notes:  Patient tolerated infusions well and without complication. Pt reported that nausea resolved and he felt "better" at end of infusions. Pt discharged in stable condition with VSS.  PIV flushed, removed, and pressure dressing applied  Treatment Education:   Information/teaching given to patient including: signs and symptoms of infection, bleeding, adverse reaction(s), symptom control, and when to notify MD.  Follow-up Appointments:  Return in 3 days (on 02/13/2023) for chemo, fast track.  Discharge instructions given to patient.  Future appointments:date given and reviewed with treatment plan.  Discharge Mode: Ambulatory with cane   Discharge Time: 1433  Accompanied by: Self  Discharged To: Home     An independent Double Check of chemotherapy/biotherapy/immunotherapy of all medications in the treatment plan for this date of service was performed to include: verification of provider order against drug label (drug, dose, route, patient name, MRN), height, weight and recalculation of body surface area (BSA). Dose, route and schedule of administration with chemotherapy reference text has also been verified. Dose being given is confirmed to be within recommended dose  range and flush solution is compatible. See medication administration record for documentation of two competent verifiers.  Treatment Administration:  Medications   ibuprofen (MOTRIN) tablet 400 mg (400 mg Oral Not given 02/10/23 1200)   sodium chloride 0.9 % bolus 1,000 mL (0 mL IntraVENOUS Completed 02/10/23 1240)   sodium chloride 0.9% infusion (0 mL IntraVENOUS Stopped 02/10/23 1419)   ondansetron (ZOFRAN) injection 8 mg (8 mg IntraVENOUS Given 02/10/23 1146)   pembrolizumab (KEYTRUDA) 200 mg in sodium chloride 0.9 % 100 mL infusion (0 mg IntraVENOUS Completed 02/10/23 1412)   enfortumab vedotin-ejfv (PADCEV) 80 mg in sodium chloride 0.9 % 100 mL  chemo infusion (0 mg IntraVENOUS IV Stop 02/10/23 1320)

## 2023-02-10 NOTE — Progress Notes (Signed)
[facility] / Medical Oncology Social Work Telephone note:    Current status:  Met with Stephen [name] in infusion center to provide [employer] card for transportation from [employer] (ACS) grant.  Stephen [name] was appreciative, as he has financial hardship and lives on limited income.    Stephen [name] continues with nausea and lack of appetite.  He believes it's from the abx he's taking and he will call the doctor about this.  He was not clear which MD has prescribed it.  I encouraged him to be a self advocate and ask important questions of his providers.      Stephen [name] wants to see [place] RD for ideas for supplements, as he's losing weight.  He'd prefer [facility] over [facility], which makes sense.    Stephen [name] was receptive and appreciative of social work visit today.    Plan:  Will follow as needed.  Stephen [name] has my number and can call anytime.    [name], [place] Social Worker  [phone]

## 2023-02-13 ENCOUNTER — Ambulatory Visit
Admission: RE | Admit: 2023-02-13 | Discharge: 2023-02-13 | Disposition: A | Discharge status: Home / Self Care | Payer: Medicare Other | Source: Ambulatory Visit | Attending: Vascular & Interventional Radiology | Admitting: Vascular & Interventional Radiology

## 2023-02-13 ENCOUNTER — Ambulatory Visit (HOSPITAL_BASED_OUTPATIENT_CLINIC_OR_DEPARTMENT_OTHER): Payer: Medicare Other

## 2023-02-13 ENCOUNTER — Encounter (HOSPITAL_COMMUNITY)
Admission: RE | Disposition: A | Discharge status: Home Health | Payer: Self-pay | Source: Ambulatory Visit | Attending: Vascular & Interventional Radiology

## 2023-02-13 DIAGNOSIS — C679 Malignant neoplasm of bladder, unspecified: Secondary | ICD-10-CM | POA: Insufficient documentation

## 2023-02-13 SURGERY — IR PLCMT VASCULAR PORT
Anesthesia: Moderate Sedation - by non-anesthesia staff only

## 2023-02-13 MED ORDER — SODIUM CHLORIDE 0.9 % IV SOLN
INTRAVENOUS | Status: AC | PRN
Start: 2023-02-13 — End: 2023-02-13
  Administered 2023-02-13: 75 mL/h via INTRAVENOUS

## 2023-02-13 MED ORDER — HYDROMORPHONE HCL 1 MG/ML IJ SOLN
1.0000 mg | INTRAMUSCULAR | Status: DC | PRN
Start: 2023-02-13 — End: 2023-02-13

## 2023-02-13 MED ORDER — FENTANYL CITRATE (PF) 100 MCG/2ML IJ SOLN
INTRAMUSCULAR | Status: DC | PRN
Start: 2023-02-13 — End: 2023-02-13
  Administered 2023-02-13 (×4): 25 ug via INTRAVENOUS

## 2023-02-13 MED ORDER — HEPARIN SODIUM LOCK FLUSH 100 UNIT/ML IJ SOLN CUSTOM
500.0000 [IU] | Freq: Once | INTRAVENOUS | Status: DC | PRN
Start: 2023-02-13 — End: 2023-02-13

## 2023-02-13 MED ORDER — LIDOCAINE-EPINEPHRINE 1 %-1:100000 IJ SOLN
INTRAMUSCULAR | Status: AC
Start: 2023-02-13 — End: ?
  Filled 2023-02-13: qty 20

## 2023-02-13 MED ORDER — MIDAZOLAM HCL 2 MG/2ML IJ SOLN
INTRAMUSCULAR | Status: AC
Start: 2023-02-13 — End: ?
  Filled 2023-02-13: qty 2

## 2023-02-13 MED ORDER — LIDOCAINE-EPINEPHRINE 1 %-1:100000 IJ SOLN
INTRAMUSCULAR | Status: DC | PRN
Start: 2023-02-13 — End: 2023-02-13
  Administered 2023-02-13: 3 mL via INTRADERMAL

## 2023-02-13 MED ORDER — FENTANYL CITRATE (PF) 100 MCG/2ML IJ SOLN
INTRAMUSCULAR | Status: AC
Start: 2023-02-13 — End: ?
  Filled 2023-02-13: qty 2

## 2023-02-13 MED ORDER — LIDOCAINE HCL 1 % IJ SOLN
INTRAMUSCULAR | Status: DC | PRN
Start: 2023-02-13 — End: 2023-02-13
  Administered 2023-02-13: 3 mL via INTRADERMAL
  Administered 2023-02-13: 5 mL via INTRADERMAL

## 2023-02-13 MED ORDER — HEPARIN SODIUM LOCK FLUSH 100 UNIT/ML IJ SOLN CUSTOM
INTRAVENOUS | Status: DC | PRN
Start: 2023-02-13 — End: 2023-02-13
  Administered 2023-02-13: 500 [IU]

## 2023-02-13 MED ORDER — METOCLOPRAMIDE HCL 5 MG/ML IJ SOLN
10.0000 mg | INTRAMUSCULAR | Status: DC | PRN
Start: 2023-02-13 — End: 2023-02-13

## 2023-02-13 MED ORDER — ONDANSETRON HCL 4 MG/2ML IV SOLN
4.0000 mg | INTRAMUSCULAR | Status: DC | PRN
Start: 2023-02-13 — End: 2023-02-13

## 2023-02-13 MED ORDER — HEPARIN SODIUM LOCK FLUSH 100 UNIT/ML IJ SOLN CUSTOM
INTRAVENOUS | Status: AC
Start: 2023-02-13 — End: ?
  Filled 2023-02-13: qty 5

## 2023-02-13 MED ORDER — MIDAZOLAM HCL 2 MG/2ML IJ SOLN
INTRAMUSCULAR | Status: DC | PRN
Start: 2023-02-13 — End: 2023-02-13
  Administered 2023-02-13 (×4): .5 mg via INTRAVENOUS

## 2023-02-13 MED ORDER — HYDROCODONE-ACETAMINOPHEN 5-325 MG OR TABS
1.0000 | ORAL_TABLET | ORAL | Status: DC | PRN
Start: 2023-02-13 — End: 2023-02-13

## 2023-02-13 SURGICAL SUPPLY — 16 items
ADHESIVE MINI DERMABOND HV (Suture) ×1
ADHESIVE MINI DERMABOND HV BX/12 (Suture) ×1
COVER FLEXIBLE LIGHT HANDLE SINGLE- STERILE (Drapes/towels) ×1
COVER PROBE 6" X 96 CS/20 (Drapes/towels) ×1 IMPLANT
COVER SNAP KOVER BANDED BAG 30" X 36" (Drapes/towels) ×1
CUTDOWN TRAY (Disp Instruments) ×1 IMPLANT
DECANTER BAG-A-JET STERILE (Misc Medical Supply) ×1
FLOWSWITCH TIP HP (Misc Surgical Supply) ×1 IMPLANT
GLOVE BIOGEL PI INDICATOR SIZE 7 (Gloves/Gowns) ×1
GLOVE BIOGEL PI ULTRATOUCH SIZE 7 (Gloves/Gowns) ×2 IMPLANT
GOWN SURGICAL ULTRA XL BLUE, AAMI LVL 3 (Gloves/Gowns) ×1 IMPLANT
KIT STD M.I. 5FR X 45CM NT/T ECHO S PG BX/10 (Kits/Sets/Trays) ×1 IMPLANT
PORT SMARTPORT CT TI 9.6FR (Ports and Reservoirs) ×1 IMPLANT
PROCEDURE PACK - IR NON-VASCULAR (Procedure Packs/kits) ×2 IMPLANT
SCALPEL DISPOSABLE #15 BLADE (Knives/Blades) ×1 IMPLANT
SUTURE VICRYL PLUS 3-0 27" PS-1 VCP936H (Suture) ×1 IMPLANT

## 2023-02-13 NOTE — Discharge Instructions (Signed)
General     SEDATION     If you were given sedative or pain medications used during the procedure you are considered legally impaired for 24 hours. Medications used for sedation can remain in your system for up to 24 hours. Please do not drive, sign any legal documents or drink alcohol during this time.     You may experience temporary alterations in memory or amnesia. If this has not resolved after 24 hours please contact us.     PAIN MANAGEMENT     You may use over-the-counter acetaminophen (TYLENOL) for minor discomfort, if you are not otherwise restricted from using this medication or any other prescribed pain medication by the doctor that performed your procedure.     WHEN TO CALL YOUR PHYSICIAN      Fever (temperature > 100 F) and / or chills    Severe pain / chest pain    Bleeding, redness / drainage or new swelling around the incision site    Shortness of breath    Nausea or vomiting     Tubes/drains/lines:     If you have a drain or tube in place, no swimming, baths, or soaking in hot tubs.     MEDICATIONS     Please resume taking your regular medications, unless otherwise told by your doctor.    CONTACT us:     During regular business hours, M-F 8:00 to 5:00, please call the [facility] at [phone].    After Hours: Please call the [place] [facility] Operator at [phone] and ask for the Interventional Radiologist On-Call.     Please call the [place] [facility] Operator at [phone] and ask for the Neurosurgery doctor On-Call (for Neuro patients)      Port placement/removal:    If Skin Glue was used to [employer] placement/removal:    If Skin Glue was used to close the incision, please prevent clothing from irritating the site. No dressing is required. DO NOT apply any creams or lotions to the incision site.  No swimming or use of a hot tub until the site has fully healed, approximately 7-10 days     PORT CARE:  When not in use, The SmartPort should be flushed every 4 weeks, this should be done at your oncologist office  or transfusion center.    Your [place] is MRI conditional. This means you can safely have an MRI if:              The magnet strength is 3 Tesla or less          **Notify the MRI technologist or doctor that you have a metal port prior to your scanTemple-Inland are made of Titanium and should not cause a problem in airport scanners. However, it is recommended that you carry your implant card at all times.     a hot tub until the site has fully healed, approximately 7-10 days

## 2023-02-13 NOTE — H&P (Signed)
HISTORY & PHYSICAL - INTERVAL ASSESSMENT    **ONLY TO BE USED IN ADDITION TO A HISTORY & PHYSICAL**    Stephen [name]  [id number]      This interval assessment is required for History & Physical completed less than 30 days but more than 24 hours prior to the admission or surgery. A History & Physical completed more than 30 days prior to the admission or surgery must be repeated.  See H & P from Dr. Nicole Kindred on 02/09/23.      Current Medical Status:  Unchanged    Medications / Allergies:  Unchanged    Review of Systems:  Unchanged    Physical Examination:  I have examined the patient today. Pertinent portions of the exam are unchanged    Laboratory or Clinical Data:  Unchanged    Modifications of Initial Care Plan:  Unchanged    PRE-PROCEDURE RECORD FOR SEDATION    Planned procedure:  Procedure(s):  IR PLCMT VASCULAR PORT    Planned type of sedation:   Moderate sedation.  I have discussed this procedure, its potential risks and complications, the risks associated with refusal of the procedure, and alternatives to the procedure with this patient.  Planned sedation route:  IV.      Sedation/Anesthesia History:    The patient has no history of an adverse reaction to sedation or anesthesia.    I have assessed the patient's status immediately prior to this procedure:  Yes  I have discussed pain management needs and options for the patient with the patient or caregiver:  Yes    Sedation options, risks, and plans have been discussed with the patient or caregiver.  Questions were answered.  The patient or caregiver agrees to proceed as planned.    Airway History and Assessment:      Airway Class:  Class II.  Neck range of motion  normal.    Potential airway issues?  None    ASA classification of physical status:  3.  A patient with severe systemic disease that limits activity but is not incapacitating.    ATTENDING RADIOLOGIST: Dr. Zane Herald    The patient has consented to the procedure.    Stephen [name]     02/13/23      8:38 AM

## 2023-02-13 NOTE — Plan of Care (Signed)
Problem: Promotion of Perioperative Health and Safety  Goal: Promotion of Health and Safety of the Perioperative Patient  Description: The patient remains safe, receives treatment appropriate to the surgical intervention and patient's physiological needs and is discharged or transferred to the appropriate level of care.    Information below is the current care plan.  Outcome: Progressing  Flowsheets (Taken 02/13/2023 0805)  Patient /Family stated Goal: have port placed

## 2023-02-14 ENCOUNTER — Encounter (HOSPITAL_BASED_OUTPATIENT_CLINIC_OR_DEPARTMENT_OTHER): Payer: Self-pay | Admitting: Hematology & Oncology

## 2023-02-14 DIAGNOSIS — C7910 Secondary malignant neoplasm of unspecified urinary organs: Secondary | ICD-10-CM

## 2023-02-16 ENCOUNTER — Ambulatory Visit: Payer: Medicare Other | Attending: Cardiovascular Disease | Admitting: Cardiovascular Disease

## 2023-02-16 ENCOUNTER — Encounter: Payer: Self-pay | Admitting: Cardiovascular Disease

## 2023-02-16 VITALS — BP 138/80 | HR 54 | Ht 68.0 in | Wt 224.5 lb

## 2023-02-16 DIAGNOSIS — E785 Hyperlipidemia, unspecified: Secondary | ICD-10-CM

## 2023-02-16 DIAGNOSIS — I359 Nonrheumatic aortic valve disorder, unspecified: Secondary | ICD-10-CM | POA: Diagnosis not present

## 2023-02-16 DIAGNOSIS — I2510 Atherosclerotic heart disease of native coronary artery without angina pectoris: Secondary | ICD-10-CM | POA: Diagnosis not present

## 2023-02-16 DIAGNOSIS — I714 Abdominal aortic aneurysm, without rupture, unspecified: Secondary | ICD-10-CM | POA: Diagnosis not present

## 2023-02-16 NOTE — Progress Notes (Signed)
Cardiology Office Note   Date:  02/16/2023   ID:  Johnny Holland, DOB 07-04-1951, MRN [id number]  PCP:  Ria Bush, MD  Cardiologist:   Kathlyn Sacramento, MD   Chief Complaint  Patient presents with   Other    12 month F/u no complaints today. Meds reviewed verbally with pt.      History of Present Illness:And Johnny Holland is a 72 y.o. male who is here today for a follow-up visit regarding nonobstructive coronary artery disease and aortic valve disease.   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  Echocardiogram in April 2019 showed normal LV systolic function, grade 2 diastolic dysfunction, mildly calcified aortic valve without significant stenosis, mild to moderate aortic regurgitation and no evidence of pulmonary hypertension. Nuclear stress test in 2019 showed small apical defect felt to represent artifact with normal ejection fraction. Most recent echocardiogram in June 2020 showed an EF of 60 to 65%, moderately calcified aortic valve with mild aortic insufficiency and stenosis.    CTA of the coronary arteries done in August 2020 showed a calcium score of 580, nondominant RCA with 50% proximal stenosis and moderate proximal LAD stenosis.  No significant lesions by FFR.  He had myalgia with high-dose atorvastatin that resolved after decreasing the dose to 40 mg once daily.  He is known to have small abdominal aortic aneurysm.  He has been doing well with no chest pain, shortness of breath or palpitations.  He quit smoking cigarettes in 2011 but continues to [place].  Past Medical History:  Diagnosis Date   Aortic regurgitation 10/15/2012   Echocardiogram showing calcified aortic valves, mild AS, mod AR, preserved EF 55-65%, mild LV dilation and LVH (02/2017). rec rpt 1 yr.    CAD (coronary artery disease) 01/2017   4v by CT   Carotid stenosis 01/12/2016    Minimal on [name] (01/2016) f/u PRN    COPD (chronic obstructive pulmonary disease) ([place]) 01/2017   mild paraseptal and centrilobular by CT   COVID-19 virus infection 11/2020   Diverticulitis    Diverticulosis of colon    Duodenal ulcer    Hepatic steatosis 01/2017   by CT   History of smoking    HLD (hyperlipidemia)    Right shoulder pain    impingement with partial RTC tear ([place])   Right-sided Bell's palsy 12/06/2018   Thoracic aorta atherosclerosis ([place]) 01/2017   by CT    Past Surgical History:  Procedure Laterality Date   CARDIOVASCULAR STRESS TEST  04/2018   low risk study, no ischemia   COLONOSCOPY  11/30/06   diverticulosis; no polyps   COLONOSCOPY WITH PROPOFOL N/A 04/13/2020   TA x2, divrticulosis, rpt 5 yrs (Vanga, Tally Due, MD)   INCISE AND DRAIN ABCESS     Hemmorhoid   VASECTOMY     x 2     Current Outpatient Medications  Medication Sig Dispense Refill   albuterol (PROVENTIL HFA;VENTOLIN HFA) 108 (90 Base) MCG/ACT inhaler Inhale 2 puffs into the lungs every 6 (six) hours as needed for wheezing or shortness of breath. 1 Inhaler 0   aspirin 81 MG tablet Take 1 tablet (81 mg total) by mouth daily.     atorvastatin (LIPITOR) 40 MG tablet Take 1 tablet (40 mg total) by mouth daily. TAKE 1/2 TABLET BY MOUTH EVERY DAY (Patient taking differently: Take 40 mg by mouth daily.) 90 tablet 3  Multiple Vitamin (MULTI-VITAMIN DAILY PO) Take by mouth.     sildenafil (REVATIO) 20 MG tablet Take 2-5 tablets (40-100 mg total) by mouth daily as needed (relations). 30 tablet 5   No current facility-administered medications for this visit.    Allergies:   Erythromycin    Social History:  The patient  reports that he quit smoking about 11 years ago. His smoking use included cigarettes. He has a 42.00 pack-year smoking history. He has never used smokeless tobacco. He reports current alcohol use. He reports that he does not use drugs.   Family History:  The patient's family  history includes Alcohol abuse in his father and sister; Alzheimer's disease in an other family member; Breast cancer in his maternal aunt; COPD in his mother; Cataracts in his mother; Cirrhosis in his father; Depression in his sister; Diabetes in his paternal grandmother; Emphysema in his father; Heart attack (age of onset: 67) in his father; Heart failure in his father; Macular degeneration in his mother.    ROS:  Please see the history of present illness.   Otherwise, review of systems are positive for none.   All other systems are reviewed and negative.    PHYSICAL EXAM: VS:  BP 138/80 (BP Location: Left Arm, Patient Position: Sitting, Cuff Size: Normal)   Pulse (!) 54   Ht '5\' 8"'$  (1.727 m)   Wt 224 lb 8 oz (101.8 kg)   SpO2 97%   BMI 34.14 kg/m  , BMI Body mass index is 34.14 kg/m. GEN: Well nourished, well developed, in no acute distress  HEENT: normal  Neck: no JVD, carotid bruits, or masses Cardiac: RRR; no  rubs, or gallops,no edema .  2 / 6 systolic ejection murmur in the aortic area which is made peaking with radiation to the carotid arteries Respiratory:  clear to auscultation bilaterally, normal work of breathing GI: soft, nontender, nondistended, + BS MS: no deformity or atrophy  Skin: warm and dry, no rash Neuro:  Strength and sensation are intact Psych: euthymic mood, full affect   EKG:  EKG is ordered today. The ekg ordered today demonstrates sinus bradycardia with a PAC and left axis deviation.  Recent Labs: 04/21/2022: ALT 16; BUN 14; Creatinine, Ser 1.12; Potassium 4.5; Sodium 142    Lipid Panel    Component Value Date/Time   CHOL 145 04/21/2022 0854   CHOL 141 04/10/2020 1127   TRIG 70.0 04/21/2022 0854   HDL 57.70 04/21/2022 0854   HDL 60 04/10/2020 1127   CHOLHDL 3 04/21/2022 0854   VLDL 14.0 04/21/2022 0854   LDLCALC 74 04/21/2022 0854   LDLCALC 59 04/10/2020 1127   LDLDIRECT 65 04/10/2020 1127   LDLDIRECT 130.1 10/10/2011 0938      Wt Readings  from Last 3 Encounters:  02/16/23 224 lb 8 oz (101.8 kg)  05/04/22 220 lb (99.8 kg)  04/29/22 220 lb (99.8 kg)          04/27/2018    1:22 PM  PAD Screen  Previous PAD dx? No  Previous surgical procedure? No  Pain with walking? No  Feet/toe relief with dangling? No  Painful, non-healing ulcers? No  Extremities discolored? No      ASSESSMENT AND PLAN:  1.  Coronary artery disease involving native coronary arteries without angina: The patient is known to have mild to moderate nonobstructive coronary artery disease on previous CTA.  He is doing well with no anginal symptoms.  Continue medical therapy.  2.  Aortic valve disease:  Most recent echocardiogram in 2022 showed mild aortic stenosis.  His exam is suggestive of at least moderate stenosis.  I requested a follow-up echocardiogram to be done in June of this year.  3.  Hyperlipidemia: He is doing well with atorvastatin 40 mg once daily.  Most recent lipid profile showed an LDL of 74.  4.  tobacco use: He quit cigarette smoking but continues to vape.  I discussed with him the importance of cessation.  5.  Small abdominal aortic aneurysm.  This was 3.4 cm on most recent testing.  I requested a follow-up ultrasound in June.   Disposition:   FU with me in 1 year  Signed,  Kathlyn Sacramento, MD  02/16/2023 4:31 PM    [place]

## 2023-02-16 NOTE — Patient Instructions (Signed)
Medication Instructions:  No changes *If you need a refill on your cardiac medications before your next appointment, please call your pharmacy*   Lab Work: None ordered If you have labs (blood work) drawn today and your tests are completely normal, you will receive your results only by: [facility] (if you have MyChart) OR A paper copy in the mail If you have any lab test that is abnormal or we need to change your treatment, we will call you to review the results.   Testing/Procedures: Your physician has requested that you have an abdominal aorta duplex. During this test, an ultrasound is used to evaluate the aorta. Allow 30 minutes for this exam. Do not eat after midnight the day before and avoid carbonated beverages.  Your physician has requested that you have an echocardiogram. Echocardiography is a painless test that uses sound waves to create images of your heart. It provides your doctor with information about the size and shape of your heart and how well your heart's chambers and valves are working.   You may receive an ultrasound enhancing agent through an IV if needed to better visualize your heart during the echo. This procedure takes approximately one hour.  There are no restrictions for this procedure.  This will take place at [facility] ([id number], [place]    Follow-Up: At [facility], you and your health needs are our priority.  As part of our continuing mission to provide you with exceptional heart care, we have created designated Provider Care Teams.  These Care Teams include your primary Cardiologist (physician) and Advanced Practice Providers (APPs -  Physician Assistants and Nurse Practitioners) who all work together to provide you with the care you need, when you need it.  We recommend signing up for the patient portal called "MyChart".  Sign up information is provided on this After Visit Summary.  MyChart is used to  connect with patients for Virtual Visits (Telemedicine).  Patients are able to view lab/test results, encounter notes, upcoming appointments, etc.  Non-urgent messages can be sent to your provider as well.   To learn more about what you can do with MyChart, go to [email or web address].    Your next appointment:   12 month(s)  Provider:   You may see Kathlyn Sacramento, MD or one of the following Advanced Practice Providers on your designated Care Team:   Murray Hodgkins, NP Christell Faith, PA-C Cadence Kathlen Mody, PA-C Gerrie Nordmann, NP

## 2023-02-17 ENCOUNTER — Ambulatory Visit
Admission: RE | Admit: 2023-02-17 | Discharge: 2023-02-17 | Disposition: A | Discharge status: Home / Self Care | Payer: Medicare Other | Attending: Hematology & Oncology | Admitting: Hematology & Oncology

## 2023-02-17 ENCOUNTER — Ambulatory Visit (HOSPITAL_BASED_OUTPATIENT_CLINIC_OR_DEPARTMENT_OTHER): Admit: 2023-02-17 | Discharge: 2023-02-17 | Payer: Medicare Other

## 2023-02-17 ENCOUNTER — Encounter (HOSPITAL_BASED_OUTPATIENT_CLINIC_OR_DEPARTMENT_OTHER): Payer: Self-pay

## 2023-02-17 VITALS — BP 116/66 | HR 63 | Temp 97.3°F | Resp 16 | Ht 66.14 in | Wt 166.2 lb

## 2023-02-17 VITALS — BP 127/72 | HR 65 | Temp 98.0°F | Resp 20 | Ht 66.14 in | Wt 166.2 lb

## 2023-02-17 DIAGNOSIS — N2889 Other specified disorders of kidney and ureter: Secondary | ICD-10-CM

## 2023-02-17 DIAGNOSIS — Z5112 Encounter for antineoplastic immunotherapy: Secondary | ICD-10-CM | POA: Insufficient documentation

## 2023-02-17 DIAGNOSIS — C662 Malignant neoplasm of left ureter: Secondary | ICD-10-CM

## 2023-02-17 DIAGNOSIS — Z95828 Presence of other vascular implants and grafts: Secondary | ICD-10-CM | POA: Insufficient documentation

## 2023-02-17 DIAGNOSIS — Z5189 Encounter for other specified aftercare: Secondary | ICD-10-CM

## 2023-02-17 LAB — COMPREHENSIVE METABOLIC PANEL, BLOOD
ALT (SGPT): 16 U/L (ref 0–41)
AST (SGOT): 24 U/L (ref 0–40)
Albumin: 3.9 g/dL (ref 3.5–5.2)
Alkaline Phos: 331 U/L — ABNORMAL HIGH (ref 40–129)
Anion Gap: 11 mmol/L (ref 7–15)
BUN: 12 mg/dL (ref 8–23)
Bicarbonate: 28 mmol/L (ref 22–29)
Bilirubin, Tot: 0.61 mg/dL (ref <1.2)
Calcium: 8.7 mg/dL (ref 8.5–10.6)
Chloride: 97 mmol/L — ABNORMAL LOW (ref 98–107)
Creatinine: 1.03 mg/dL (ref 0.67–1.17)
Glucose: 111 mg/dL — ABNORMAL HIGH (ref 70–99)
Potassium: 4.4 mmol/L (ref 3.5–5.1)
Sodium: 136 mmol/L (ref 136–145)
Total Protein: 7 g/dL (ref 6.0–8.0)
eGFR Based on CKD-EPI 2021 Equation: >60 mL/min/{1.73_m2}

## 2023-02-17 LAB — CBC WITH DIFF, BLOOD
ANC-Automated: 3.4 10*3/uL (ref 1.6–7.0)
ANC-Instrument: 3.4 10*3/uL (ref 1.6–7.0)
Abs Basophils: 0.1 10*3/uL (ref <0.2)
Abs Eosinophils: 0.2 10*3/uL (ref 0.0–0.5)
Abs Lymphs: 1.5 10*3/uL (ref 0.8–3.1)
Abs Monos: 1 10*3/uL — ABNORMAL HIGH (ref 0.2–0.8)
Basophils: 1 %
Eosinophils: 3 %
Hct: 30.6 % — ABNORMAL LOW (ref 40.0–50.0)
Hgb: 10 gm/dL — ABNORMAL LOW (ref 13.7–17.5)
Lymphocytes: 24 %
MCH: 27.8 pg (ref 26.0–32.0)
MCHC: 32.7 g/dL (ref 32.0–36.0)
MCV: 85 um3 (ref 79.0–95.0)
MPV: 9.4 fL (ref 9.4–12.4)
Monocytes: 16 %
Plt Count: 348 10*3/uL (ref 140–370)
RBC: 3.6 10*6/uL — ABNORMAL LOW (ref 4.60–6.10)
RDW: 15.7 % — ABNORMAL HIGH (ref 12.0–14.0)
Segs: 56 %
WBC: 6.1 10*3/uL (ref 4.0–10.0)

## 2023-02-17 MED ORDER — SODIUM CHLORIDE 0.9 % IV SOLN
80.0000 mg | Freq: Once | INTRAVENOUS | Status: AC
Start: 2023-02-17 — End: 2023-02-17
  Administered 2023-02-17: 80 mg via INTRAVENOUS
  Filled 2023-02-17: qty 60

## 2023-02-17 MED ORDER — SODIUM CHLORIDE 0.9 % IJ SOLN (CUSTOM)
20.0000 mL | INTRAMUSCULAR | Status: DC | PRN
Start: 2023-02-17 — End: 2023-02-17
  Administered 2023-02-17: 20 mL via INTRAVENOUS

## 2023-02-17 MED ORDER — SODIUM CHLORIDE 0.9 % IV BOLUS
1000.0000 mL | INJECTION | Freq: Once | INTRAVENOUS | Status: AC
Start: 2023-02-17 — End: 2023-02-17
  Administered 2023-02-17: 1000 mL via INTRAVENOUS

## 2023-02-17 MED ORDER — HEPARIN SODIUM LOCK FLUSH 100 UNIT/ML IJ SOLN CUSTOM
500.0000 [IU] | INTRAVENOUS | Status: DC | PRN
Start: 2023-02-17 — End: 2023-02-18
  Administered 2023-02-17: 500 [IU] via INTRAVENOUS

## 2023-02-17 MED ORDER — ONDANSETRON HCL 4 MG/2ML IV SOLN
8.0000 mg | Freq: Once | INTRAMUSCULAR | Status: AC
Start: 2023-02-17 — End: 2023-02-17
  Administered 2023-02-17: 8 mg via INTRAVENOUS
  Filled 2023-02-17: qty 4

## 2023-02-17 MED ORDER — SODIUM CHLORIDE 0.9 % IV SOLN
Freq: Once | INTRAVENOUS | Status: AC
Start: 2023-02-17 — End: 2023-02-17

## 2023-02-17 MED ORDER — HEPARIN SODIUM LOCK FLUSH 100 UNIT/ML IJ SOLN CUSTOM
500.0000 [IU] | INTRAVENOUS | Status: DC | PRN
Start: 2023-02-17 — End: 2023-02-17
  Administered 2023-02-17: 500 [IU] via INTRAVENOUS

## 2023-02-17 MED ORDER — SODIUM CHLORIDE 0.9 % IJ SOLN (CUSTOM)
20.0000 mL | INTRAMUSCULAR | Status: DC | PRN
Start: 2023-02-17 — End: 2023-02-18
  Administered 2023-02-17: 20 mL via INTRAVENOUS

## 2023-02-17 NOTE — Interdisciplinary (Signed)
Chemotherapy Nursing Note - Stephen    Jaquante [name] is a 72 year old male who presents for chemotherapy cycle 2, day(s) 8 of [place].    Current Chemotherapy Signed Informed Consent verified/obtained yes    Pre-treatment nursing assessment:  Pt arrived to infusion chair ambulatory in stable condition. Pt alert and oriented. Pt describes general pain 7-8/10 today. Has medication at home but did not take it this morning. Denies needing pain medication at this time. Pt describes trouble eating, intermittent nausea, and fatigue. Pt has medications at home for nausea. MD aware of issues.   Port accessed in [place] with brisk, positive blood return. Hydration started.     Patient met treatment parameters.    Lab Results   Component Value Date    ABSNEUTRO 3.4 02/17/2023    IANC 3.4 02/17/2023    WBC 6.1 02/17/2023    RBC 3.60 (L) 02/17/2023    HGB 10.0 (L) 02/17/2023    HCT 30.6 (L) 02/17/2023    MCV 85.0 02/17/2023    MCHC 32.7 02/17/2023    RDW 15.7 (H) 02/17/2023    PLT 348 02/17/2023       Lab Results   Component Value Date    NA 136 02/17/2023    K 4.4 02/17/2023    CL 97 (L) 02/17/2023    BICARB 28 02/17/2023    BUN 12 02/17/2023    CREAT 1.03 02/17/2023    GLU 111 (H) 02/17/2023    [place] 8.7 02/17/2023           Lab Results   Component Value Date    AST 24 02/17/2023    ALT 16 02/17/2023    GGT 131 (H) 01/17/2022    LDH 172 01/19/2022    ALK 331 (H) 02/17/2023    TP 7.0 02/17/2023    ALB 3.9 02/17/2023    TBILI 0.61 02/17/2023    DBILI <0.2 01/19/2022      Pain Score: 7  Body surface area is 1.88 meters squared.  Body mass index is 26.72 kg/m.    ECOG  ECOG: 1     PHQ2     Not Applicable    Chemotherapy:  Stephen [name] tolerated treatment well.    Post blood return: Brisk  IV access post infusion: NS and Heparin 100 units/ml      Patient Education  Learner: Patient  Barriers to learning: No Barriers  Readiness to learn: Acceptance  Method: Explanation    Treatment Education:       Signs and symptoms  of infection, bleeding, adverse reaction(s), symptom control, and when to notify MD.    Patient instructed on post-treatment care and precautions specific to drug(s) administered.   If applicable, education given on vesicant administration risk and signs and symptoms of extravasation (redness, pain, swelling, pressure at IV site) to report immediately to the chemotherapy RN.    Fall Prevention Education: Instructed patient to call for assistance, call light within reach.   Pain Education: Patient instructed to contact nurse if pain should develop or if their current pain therapy becomes ineffective.       Treatment education provided to patient: yes    Response: Verbalizes understanding    Discharge Plan  Discharge instructions given to patient.  Future appointments:date given and reviewed with treatment plan.  Discharge Mode: Ambulatory  Discharge Time: 1200    Accompanied by: Self  Discharged To: Home

## 2023-02-17 NOTE — Interdisciplinary (Signed)
Nurses Note: Pt in for Port-A-Cath line care and lab draw.  Vital signs stable and pt. in no apparent distress.  Pt reports no nausea, vomiting, constipation, or diarrhea.  Site clean, dry, and intact.  Line accessed and blood return verified.  Dressing and posiflow cap changed.  Line flushed with NS and Heparin 100 units/ml.  Pt waiting for lab results.  Pt discharged to lobby no apparent distress.  Pt will return to infusion later today for Treatment.

## 2023-02-20 ENCOUNTER — Telehealth (HOSPITAL_BASED_OUTPATIENT_CLINIC_OR_DEPARTMENT_OTHER): Payer: Self-pay | Admitting: Urology

## 2023-02-20 ENCOUNTER — Encounter (HOSPITAL_BASED_OUTPATIENT_CLINIC_OR_DEPARTMENT_OTHER): Payer: Self-pay | Admitting: Radiation Oncology

## 2023-02-20 ENCOUNTER — Encounter (HOSPITAL_BASED_OUTPATIENT_CLINIC_OR_DEPARTMENT_OTHER): Payer: Self-pay | Admitting: Hematology & Oncology

## 2023-02-20 NOTE — Telephone Encounter (Signed)
From: Lovena Le  To: Doreen Beam, MD  Sent: 02/20/2023 9:14 AM PST  Subject: diet concerns and med question     have a nutrition consult Thursday at [facility] no appetite not eating tired also my GP after my visit with labs and UA gave me Cipro should I take it another bug in the urine/MR#[id number]

## 2023-02-20 NOTE — Progress Notes (Signed)
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 He missed multiple appointments over multiple weeks. He eventually came for one fraction, but then was unreachable and did not return for any further treatment. We attempted to call multiple times but patient was unreachable.  It has been a pleasure assisting in the care of Stephen [name].     Sandria Manly, MD   [facility] [employer]    Electronically Signed: Sandria Manly, MD  Electronically Signed Date/Time:  02/15/2023 1:30:27 PM  cc: Thomes Lolling

## 2023-02-20 NOTE — Telephone Encounter (Signed)
Patient is calling asking if CYSTO appointment on 03/03/23 is still needed. He states visit was made last year and not sure if Dr. Sallyanne Kuster would still like patient to come in for a cystoscopy. Patient states he has came in for multiple visits with Dr. Nicole Kindred.     Patient states he prefers a response back via mychart.

## 2023-02-21 ENCOUNTER — Telehealth (HOSPITAL_BASED_OUTPATIENT_CLINIC_OR_DEPARTMENT_OTHER): Payer: Self-pay | Admitting: Hematology & Oncology

## 2023-02-21 NOTE — Telephone Encounter (Signed)
Patient called this navigator asking for assistance in understanding the appointments he needs to schedule.     1:15pm I returned his call and did have to leave a v/m. I left my call back info w/a brief explanation for call.

## 2023-02-21 NOTE — Telephone Encounter (Signed)
Patient requesting a call back in regards to previous message  Please call to confirm date and time        Please call  [phone] ok to leave a detailed   Or   Send my chart message

## 2023-02-22 ENCOUNTER — Telehealth (HOSPITAL_BASED_OUTPATIENT_CLINIC_OR_DEPARTMENT_OTHER): Payer: Self-pay | Admitting: Physician Assistant

## 2023-02-22 NOTE — Telephone Encounter (Signed)
Played phone tag with patient last couple of days. Patient did call again and I was able to speak to him. He is not doing so well with the side effects of Chemo. He has lost 20 lbs, and continues with nausea and very low appetite. He has an appointment with Nutrition on 02/23/23. He has his appointments in order, but is having a hard time getting all of his Radiation appointments scheduled. He is having radiation down in [facility]. He will have to coordinate with them. I did offer to assist him with scheduling all rides once radiation is scheduled. He will let me know once that is complete. He says he only needs 9 more visits. Patient did v/u that I need him to let me know when his radiation appointments are scheduled.

## 2023-02-23 ENCOUNTER — Telehealth (HOSPITAL_BASED_OUTPATIENT_CLINIC_OR_DEPARTMENT_OTHER): Payer: Self-pay

## 2023-02-23 ENCOUNTER — Encounter (HOSPITAL_BASED_OUTPATIENT_CLINIC_OR_DEPARTMENT_OTHER): Payer: Self-pay | Admitting: Hematology & Oncology

## 2023-02-23 ENCOUNTER — Ambulatory Visit: Payer: Medicare Other

## 2023-02-23 DIAGNOSIS — C642 Malignant neoplasm of left kidney, except renal pelvis: Secondary | ICD-10-CM

## 2023-02-23 DIAGNOSIS — E860 Dehydration: Secondary | ICD-10-CM

## 2023-02-23 DIAGNOSIS — C689 Malignant neoplasm of urinary organ, unspecified: Secondary | ICD-10-CM

## 2023-02-23 DIAGNOSIS — C7910 Secondary malignant neoplasm of unspecified urinary organs: Secondary | ICD-10-CM

## 2023-02-23 NOTE — Interdisciplinary (Signed)
 Nutrition Outpatient Initial Assessment     Location of Service: [facility]   [facility]  [address] 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     Patient presents today prior to D8 EV. Still struggling with a variety of issues, however, we have a comprehensive plan in place to address the following: Pain management per Palliative Care - pt encouraged to restart methadone/pain meds per their recommendations. Pill box to facilitate med organization. Palliative XRT to R glenohumeral met s/p SIM on 2/07; possible start date next week. PCN exchange planned for 2/14. Suspect nausea may be due to constipation - start docusate bid and cont senna for BM's. Port placement planned for 2/26. RTC in 2 weeks with Dr. Roseanne Reno prior to C2   ______________________________________________________________________  ______________________________________________________________________      Chief Complaint [Nutritional]: Nutritional Optimization    RD Assessment: Pt presents unaccompanied to clinic visit. Pt reports poor appetite since he's started chemotherapy, states he'll try to eat but "it doesn't want to go down". Endorses dysgeusia, describing as lack of taste with food. Pt is also having issues w/ bottom teeth implants so is having difficulty eating solid foods. Pt provided w/ ONS samples last week, states they're okay but doesn't think he could drink 2-3 cartons/day of the ones we provided him. Remembers receiving Nepro while at [facility] and likes this ONS the best out of the ones he tried  so far, feels he could drink more of the nepro shakes.     Note pt presents w/ significant weight loss (12% in 1 month), poor PO intake for 1 month and observed physical signs of severe muscle and fat wasting - meets criteria for severe malnutrition in the setting of chronic illness.     Discussed importance of preventing further weight loss during treatment. Encouraged small frequent meals every 2-3 hours throughout the day and choosing high calorie high protein food and liquids choices to aid in meeting nutrient needs and maximizing intake.Recommended goal of minimum 3 nepro/day to aid in meeting nutrient needs.Suggested liquids w/ calories such as pedilyte, whole milk, etc. Discussed possible need for IV hydration given ongoing limited PO intake, message sent to RN. Provided pt w/ more high calorie shakes samples and Pedialyte powder packets today. Provided w/ soft high protein foods list. Encouraged pt to continue supportive medication management as prescribed to aid in symptom management as able. Pt v/u, will continue to follow closely during treatment.     #pended MD Nepro ONS order       GI: +nausea, denies emesis, takes compazine as needed which helps manage symptoms. +constipation, relates it to pain medication management, takes senna to manage as needed, last BM was Tuesday        Diet Type: oral    Estimated Nutrition Needs:       Calories: 2250-2625 kcal (30-35 kcal/kg)     Protein: 90-112 gm (1.2-1.5 gm/kg)     Fluids: 1 mL/kcal OR per MD  Food allergies/intolerances: NKFA          Food Recall: has only had a couple protein shakes to drink, denies any substantial solid food intake over the past week      Hydration:  not a whole lot of water      Supplements: MVI    Education / Intervention:  Focus on scheduled eating pattern of small freq meals every 2-3 hours throughout the day  Choose high calorie high protein foods to maximize intake  Recommend oral nutrition supplement to support meeting  nutrient needs: Nepro 3x/day as warranted   Continue working on hydration status  Message sent to [employer] re: IV hydration     Goals: - Pt to demonstrate intake of >80% estimated needs with good tolerance as evidenced by pt report.        ____________________________________________________________________________________________________________________________________________    Vitals:  Ht:   Height - Most Recent Measurement   02/17/23 5' 6.14" (1.68 m)      Wt.:  pt reports ~20# weight loss in the last month (significant 12.5% weight loss in 1 month)  Wt Readings from Last 1 Encounters:   02/17/23 75.4 kg (166 lb 3.6 oz)           BMI: Estimated body mass index is 26.72 kg/m as calculated from the following:    Height as of 02/17/23: 5' 6.14" (1.68 m).    Weight as of 02/17/23: 75.4 kg (166 lb 3.6 oz).  Wt hx: UBW: 184#  Wt Readings from Last 10 Encounters:   02/17/23 75.4 kg (166 lb 3.6 oz)   02/17/23 75.4 kg (166 lb 3.6 oz)   02/10/23 77.9 kg (171 lb 11.8 oz)   02/10/23 77.9 kg (171 lb 11.8 oz)   02/09/23 78.2 kg (172 lb 8 oz)   01/27/23 82.3 kg (181 lb 7 oz)   01/27/23 82.3 kg (181 lb 7 oz)   01/26/23 82.2 kg (181 lb 4.8 oz)   01/23/23 85 kg (187 lb 6.4 oz)   01/20/23 84.8 kg (187 lb 1 oz)         Labs:  Lab Results   Component Value Date    WBC 6.1 02/17/2023    RBC 3.60 (L) 02/17/2023    HGB 10.0 (L) 02/17/2023    HCT 30.6 (L) 02/17/2023    MCV 85.0 02/17/2023    MCHC 32.7 02/17/2023    RDW 15.7 (H) 02/17/2023    PLT 348 02/17/2023    MPV 9.4 02/17/2023    ABSNEUTRO 3.4 02/17/2023    IANC 3.4 02/17/2023     Lab Results   Component Value Date    NA 136 02/17/2023    K 4.4 02/17/2023    CL 97 (L) 02/17/2023    BICARB 28 02/17/2023    BUN 12 02/17/2023    CREAT 1.03 02/17/2023    GLU 111 (H) 02/17/2023    [place] 8.7 02/17/2023     Lab Results   Component Value Date    AST 24 02/17/2023    ALT 16 02/17/2023    ALK 331 (H) 02/17/2023    TP 7.0 02/17/2023    ALB 3.9 02/17/2023    TBILI 0.61 02/17/2023     Lab Results    Component Value Date    PHOS 3.5 04/07/2022    MG 2.1 01/12/2022         Medications/supplements:  Current Outpatient Medications   Medication Sig    acetaminophen (TYLENOL) 500 MG tablet Take  2 tablets (1,000 mg) by mouth every 8 hours as needed for Mild Pain (Pain Score 1-3) or Moderate Pain (Pain Score 4-6).    allopurinol (ZYLOPRIM) 100 MG tablet Take 1 tablet (100 mg) by mouth daily.    docusate sodium (COLACE) 100 MG capsule Take 1 capsule (100 mg) by mouth 2 times daily as needed for Constipation.    DULoxetine (CYMBALTA) 20 MG CR capsule Take 1 capsule (20 mg) by mouth daily.    ketotifen (ALAWAY) 0.025 % ophthalmic solution Place 1 drop into both eyes 2 times daily.    Loratadine (ALAVERT PO) Take 10 mg by mouth daily.    Multiple Vitamins-Minerals (MULTIVITAMIN WITH MINERALS) TABS tablet Take 1 tablet by mouth daily.    naloxone (NARCAN) 4 mg/0.1 mL nasal spray For suspected opioid overdose, call 911! Then spray once in one nostril. Repeat after 3 minutes if no or minimal response using a new spray in other nostril.    oxyCODONE (ROXICODONE) 10 MG tablet Take 1.5 tablets to 2 tablets (15mg  to 20mg ) by mouth every 4 hours as needed for pain    pregabalin (LYRICA) 100 MG capsule Take 1 capsule (100 mg) by mouth at bedtime.    prochlorperazine (COMPAZINE) 10 MG tablet Take 1 tablet (10 mg) by mouth every 6 hours as needed (Nausea/Vomiting).    senna (SENOKOT) 8.6 MG tablet Take 2 tablets (17.2 mg) by mouth daily.    tamsulosin (FLOMAX) 0.4 MG capsule TAKE 1 CAPSULE BY MOUTH EVERY DAY    traZODone (DESYREL) 50 MG tablet Take 0.5 tablets (25 mg) by mouth at bedtime.    triamcinolone (KENALOG) 0.1 % cream Apply 1 Application. topically 2 times daily. Apply a thin layer as directed     No current facility-administered medications for this visit.          Past Medical / Surgical History:  Past Medical History:   Diagnosis Date    Chronic back pain     Congenital hydronephrosis     Gout     Headache     Hematuria      HTN (hypertension) 12/10/2021    Kidney disease     Kidney stones     Major depressive disorder, single episode     Polyarthropathy or polyarthritis of multiple sites     Retinal detachment     Urethral stricture      Past Surgical History:   Procedure Laterality Date    CT INSERTION OF SUPRAPUBIC CATH  09/25/2015    NEPHRECTOMY Right 1955    APPENDECTOMY      COLONOSCOPY      CYSTOSCOPY      CYSTOSCOPY W/ LASER LITHOTRIPSY      OTHER SURGICAL HISTORY      Interstim 01/29/2011    SPINE SURGERY  09/21    Lumbar-sacral fusion    TRANSURETHRAL RESECTION OF PROSTATE           Readiness to change: Ready  Expected Compliance: Good                             Barriers to Learning/Compliance: None  Pt. Verbalized understanding. Patient confirmed all questions and concerns were addressed to satisfaction, email provided for interim questions.  Monitoring and Evaluation:  - Monitor po intake/diet tolerance  - Monitor BM pattern  - Monitor weight changes.        Follow up in 4 weeks as schedule allows - message  sent to scheduling team to coordinate        Riley Lam, RD

## 2023-02-23 NOTE — Telephone Encounter (Signed)
Outpatient Palliative RN Narrative    - Call placed to follow up on pain, per medication records, methadone was discontinued on 2/26 reason: "patient no longer taking", unable to reach patient to confirm or check in on pain, message left requesting call back to update    - Note from today reviewed, patient requesting refill for Compazine, Rx should have refills remaining, [facility], [facility] initiated, patient still has #4 refill remaining

## 2023-02-23 NOTE — Addendum Note (Signed)
Addended by: Thomes Lolling on: 02/23/2023 06:04 PM     Modules accepted: Orders

## 2023-02-24 ENCOUNTER — Telehealth (HOSPITAL_BASED_OUTPATIENT_CLINIC_OR_DEPARTMENT_OTHER): Payer: Self-pay | Admitting: Physician Assistant

## 2023-02-24 ENCOUNTER — Encounter (HOSPITAL_BASED_OUTPATIENT_CLINIC_OR_DEPARTMENT_OTHER): Payer: Self-pay

## 2023-02-24 NOTE — Telephone Encounter (Signed)
RN called pt to relay mcm and scheduling line.  No answer so RN LVM (no pt identifiers were provided) with information + scheduling line + callback number should he have questions.

## 2023-02-24 NOTE — Telephone Encounter (Signed)
Prescription Refill Request     Who is requesting the refill? pt    Prescribing provider: [place] office visit: 02/09/2023  Next office visit: 03/01/2023    Requested Medication/s:     Oxycodone and Compazine    Pharm[facility]ed:      [id number] [place] [place] [id number] Phone: [phone]: [phone]      Is patient out of medication? N    If yes when did patient run out of medication? Has about 2-3 doses left     Has Patient been advised this message will be transmitted to office and if any issues can expect a response within the next 72 hours? y

## 2023-02-25 ENCOUNTER — Other Ambulatory Visit (HOSPITAL_BASED_OUTPATIENT_CLINIC_OR_DEPARTMENT_OTHER): Payer: Self-pay | Admitting: Hematology & Oncology

## 2023-02-25 DIAGNOSIS — C662 Malignant neoplasm of left ureter: Secondary | ICD-10-CM

## 2023-02-27 ENCOUNTER — Ambulatory Visit (HOSPITAL_BASED_OUTPATIENT_CLINIC_OR_DEPARTMENT_OTHER): Payer: Medicare Other

## 2023-02-27 ENCOUNTER — Ambulatory Visit: Payer: Medicare Other | Attending: Family Medicine | Admitting: Family Medicine

## 2023-02-27 ENCOUNTER — Encounter (HOSPITAL_BASED_OUTPATIENT_CLINIC_OR_DEPARTMENT_OTHER): Payer: Self-pay

## 2023-02-27 VITALS — BP 112/62 | HR 76 | Temp 97.6°F | Resp 16 | Ht 66.14 in | Wt 163.5 lb

## 2023-02-27 DIAGNOSIS — G4700 Insomnia, unspecified: Secondary | ICD-10-CM | POA: Insufficient documentation

## 2023-02-27 DIAGNOSIS — M792 Neuralgia and neuritis, unspecified: Secondary | ICD-10-CM | POA: Insufficient documentation

## 2023-02-27 DIAGNOSIS — R630 Anorexia: Secondary | ICD-10-CM

## 2023-02-27 DIAGNOSIS — R634 Abnormal weight loss: Secondary | ICD-10-CM | POA: Insufficient documentation

## 2023-02-27 DIAGNOSIS — R110 Nausea: Secondary | ICD-10-CM | POA: Insufficient documentation

## 2023-02-27 DIAGNOSIS — C7910 Secondary malignant neoplasm of unspecified urinary organs: Secondary | ICD-10-CM | POA: Insufficient documentation

## 2023-02-27 DIAGNOSIS — K5900 Constipation, unspecified: Secondary | ICD-10-CM | POA: Insufficient documentation

## 2023-02-27 DIAGNOSIS — R5383 Other fatigue: Secondary | ICD-10-CM | POA: Insufficient documentation

## 2023-02-27 DIAGNOSIS — Z8701 Personal history of pneumonia (recurrent): Secondary | ICD-10-CM

## 2023-02-27 DIAGNOSIS — K1379 Other lesions of oral mucosa: Secondary | ICD-10-CM | POA: Insufficient documentation

## 2023-02-27 DIAGNOSIS — G893 Neoplasm related pain (acute) (chronic): Secondary | ICD-10-CM | POA: Insufficient documentation

## 2023-02-27 DIAGNOSIS — E46 Unspecified protein-calorie malnutrition: Secondary | ICD-10-CM | POA: Insufficient documentation

## 2023-02-27 DIAGNOSIS — R5381 Other malaise: Secondary | ICD-10-CM | POA: Insufficient documentation

## 2023-02-27 DIAGNOSIS — Z515 Encounter for palliative care: Secondary | ICD-10-CM | POA: Insufficient documentation

## 2023-02-27 MED ORDER — POLYETHYLENE GLYCOL 3350 OR PACK
17.00 g | PACK | Freq: Every day | ORAL | 2 refills | Status: DC | PRN
Start: 2023-02-27 — End: 2023-03-24

## 2023-02-27 MED ORDER — NYSTATIN 100000 UNIT/ML MT SUSP
5.0000 mL | Freq: Four times a day (QID) | OROMUCOSAL | 0 refills | Status: DC
Start: 2023-02-27 — End: 2023-04-29

## 2023-02-27 NOTE — Progress Notes (Signed)
[name] Palliative Care Note     Reason for visit: Follow-up  Pain management   Psychosocial or family support      Diagnosis: MN of urinary bladder, unspecified site      Advanced Care Planning: On File  Surrogate Decision Maker:  Stephen [name], sister  [phone]    CC:  - pain is not managed  - he was not able to walk yesterday while out in town.    - he managed to return to his placed by walking  - report report constipation  - not eating likely from a dental issues  - not sleeping well at night.     Patient is focused on fixing the nephrostomy.  Offered help but declined.  Noted the urine output draining on the tubing.    Unaccompanied this visit and took UBER    Assessment:     Patient Orientation: A&O x 4, answers questions readily and appropriately with recent and remote memories intact    Pain Assessment:    Location: bilateral shoulders and right rib area  New c/o of pain on both thighs, both knees  Quality: sharp pain on the right thigh  Severity:      Current pain score 1-10:  9 (has not taken pain medication this morning since the UBER arrived early)      Worst pain score 1-10: 10      Best daily pain score 1-10: did not discuss  Duration/Timing: continuous  Modifying factors       Aggravating factors: "from everything"       Alleviating factors: none     Pain Medication:   1) tylenol 500 mg - take 2 tabs every 8 hour PRN   - report not taking it  2) oxycodone 10 mg - Take 1.5 tablets to 2 tablets ('15mg'$  to '20mg'$ ) by mouth every 4 hours as needed for pain   3) lyrica 100 mg - Take 1 capsule (100 mg) by mouth at bedtime.   4) Cymbalta 20 mg PO daily  - did not provide answer if he is taking it or not.  His focused is on fixing the nephrostomy  5) zyloprim 100 mg daily -   - report he takes     Report he does not take the tizanidine.      Nausea/vomiting:    - onset of nausea related to dental issues, did not provide details     Medication:   1) compazine 10 mg as ordered  - did not discuss if he is  taking it.      Appetite:   - report he is not eating "zero"     GI:  Bowel Movements:   - Consistency:  report having constipation  - last bm 2 days ago     Medication:   1) colace 100 mg PO bid PRN  2) senna 8.6 mg - Take 2 tablets (17.2 mg) by mouth daily.     Anxiety/depression:   - unable to walk, not eating well, not sleeping     Medication:   None     Sleep/Fatigue   - not sleeping    Medication:   None    Financial challenges: Patient did not discuss.    Well Being Assessment:   -Patient's needs currently being addressed.  Patient's affect/mood appropriate for situation.     Education: Network engineer and verbal instruction provided regarding plan of care.   Barriers to learning assessed: No barriers noted. Patient  verbalized understanding of teaching and instructions.     Contact information, AVS given and reviewed related to plan of care. Questions answered to patient's satisfaction and verbalized understanding. Patient/family aware to call with any questions or concerns that may arise.    Stephen [name], LVN

## 2023-02-27 NOTE — Patient Instructions (Addendum)
Stephen [name] PALLIATIVE CARE SERVICE: AFTER VISIT SUMMARY   ?   Plan   If you have a telemedicine appointment, one of our nurses may call you on the day of your appointment.     -We have discontinued methadone  -We will change oxycodone IR (Roxicodone) to '10mg'$  1 tablet by mouth every 4 hours as needed for pain  -Discontinue duloxetine (Cymbalta)  -Continue pregabalin (Lyrica) '100mg'$  by mouth at bedtime  -Continue Senna 2 tablets by mouth daily, increase to twice a day as needed for constipation if needed  -Continue docusate '100mg'$  by mouth twice a day  -Start Miralax 1 tablespoon in water daily  -Continue prochlorperazine '10mg'$  by mouth every 6 hours as needed for nausea  -I have prescribed you Nystatin mouth wash     We will see you for follow up on 04/10/23 at 2 PM in person at [facility] (60 minutes)    From the social worker  Go to dentist  Call [place], [place] to schedule your radiation treatment [phone]  Attached is a list of your upcoming appointments    List of Stephen A. [name] Service Team Members  Providers: Mare Loan, NP, Pecola Lawless, DO, Isidor Holts, MD, Alroy Bailiff, MD, Fannie Knee, MD, Grafton Folk, MD  ?   Administrative Assistant/Team Coordinator: Aida Raider   ?   Social Workers: Nelson, [place], Leanna Battles, LCSW and [employer], [place]      Spiritual Counselors/Chaplains: Marcelle Smiling, [facility], Truett Mainland, [place] CME  ?   Nurse Case Managers: Nicole Kindred, RN, Nestor Ramp, RN [facility], Donna Christen RN, Artelia Laroche, LVN   .............................................................................................................................................     How to contact us:   ?   RN LINE: ???????[phone] (Please leave a voicemail and we will call you back.)   FAX: ??????????????? [phone]   MyChart: ??????? Please use MyChart for non-emergent issues.   ?   Hours: Monday through Friday 8:00 am - 5:00 pm?      After hours:  For urgent symptom issues after hours, please call [phone] and ask for the on-call palliative care doctor.   ?   Emergency: Please call 911 or go to the nearest emergency room.?   .............................................................................................................................................   ?  [facility] Chart Help Desk Phone Number: [phone]      ............................................................................................................................................     Special Instructions Regarding Medications:   ?   1. Prescription Refills   -Please let us know at least 72 hours in advance if you need a refill via phone call or via MyChart message.   -Opioid Pain medications and any other controlled drug will be written at your appointment.    -If you miss an appointment, please call [phone] and make reschedule your appointment.    -You must be seen in the clinic to receive prescriptions for opioid medications and controlled medications    ?   2. Storage    -Medicines should be stored in the original bottles in a cool, dry place.     -Please keep them securely out of reach of children and pets.   ?   3. Disposal: Please dispose of your medicines safely. Here is a link to safe disposal sites in [place]: http://www.mason.info/.php    ?   4. Precaution: Please keep all medications out of reach of children.   ?   .............................................................................................................................................?     Advance Care Planning   ?   Many patients ask about the differences between an Advance Directive  and a POLST.       Here is a Diplomatic Services operational officer that clarifies the differences between these documents:    [email or web address].cz ?      Differences between an Advance Directive and a POLST are:    An Advance Directive is  for:    1. Anyone 34 years of age or older    2. Provides instructions for future treatment    3. Appoints a health care representative    4. Guides inpatient treatment decisions when make available       A POLST is for:    1. Anyone with a serious illness - any age   79. Provides medical orders for current treatment    3. Guides actions by Emergency Medical Personnel when made available    4. Guides inpatient treatment decisions when made available   5. This is a bright pink form that should be placed on your refrigerator    .............................................................................................................................................     Patient Experience Department   ?   The Patient Experience Department acts as a bridge between our patients, hospitals and physicians to respond to concerns, issues and requests. Patient experience specialists are here to help patients and their families communicate their experiences with [facility] and navigate our health system. Please call [phone] or send an email to [email or web address].    ..............................................Marland Kitchen    MyPath    We are excited to share our new [place] application: MyPath. MyPath will guide you through your unique cancer journey and connect you with our expansive network of curated support services and resources. It is available in [place] and [place].     You can download the MyPath application in four easy steps:    1.       Use the QR code to open your app store.  2.       Click on the MyPath app icon and install on your device.  3.       Open the MyPath app.  4.       Enter your [facility] username and password to log in.

## 2023-02-27 NOTE — Progress Notes (Signed)
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 He is under the care of [facility] Dr Nicole Kindred. He met with Dr Amalia Hailey and me in person today. He appeared thin and tired today. He reported that yesterday he was out running errands and his legs felt weak and wobbly.     Per brief chart review, since our last visit patient was hospitalized with possible overdose after sister came home from church and found patient unresponsive. Today patient affirmed it's okay for teams to contact his sister for help or collateral information as needed.     Medical Issues Addressed Today:    Pain, R shoulder and arm, continues  Pain, R leg is new    Weight loss / appetite  Patient alternatively says he can't each much, or says he's eating fine  Per brief chart review, he has lost 8kg since our last appointment only 5 weeks ago.   Per self-report he had some breakfast bars, some fruit, and some nutritional supplement shakes yesterday     Constipated, hasn't started Miralax but has it at home    Psychosocial Issues Addressed Today & Current Coping:  He lives with his sister, her husband, and her daughter. He doesn't like the husband or the daughter. He likes to focus on his sister and her dog. He likes to go for a walk in the morning, with her dog, to the beach, to get a coffee. Sister works from home, but patient doesn't like to ask for her help. "She's already done enough for me."     Current Functional Status:  Using a cane today. He reported that yesterday he was out running errands and his legs felt weak and wobbly.     Living Situation:  Patient lives with his sister and her husband and her daughter in [place], [place].     Funding:  Medicare A  & B and [employer] on file. 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     Advance Care Planning  Understanding of Illness and Treatment Plan:  Patient again reports some delays in getting scheduled to start radiation at [facility]. He started radiation but then was hospitalized. He hasn't called them reschedule.   Hopes/Goals:  Today patient expressed wish to minimize his medications. He doesn't want to take things he thinks aren't helping.   POLST/Advance Directives:  AD on file. Time limited trial. Focus on pain relief is very important to patient.   Health Care Representative/Surrogate Decision Maker (in absence of advance directive):  Payton Emerald [id number]      Assessment:  This is a 72 year-old man with metastatic urothelial cancer.     Plan:  PALL SW FOLLOW UP: SW to remain available PRN    Leanna Battles, LCSW (she, her)   Clinical Social Worker       Outpatient Palliative Care

## 2023-02-27 NOTE — Progress Notes (Signed)
[facility] Service Outpatient Progress Note    Requested by Dr. Thomes Lolling    CC: Pain, psychosocial support    ID: Stephen [name] is a 72 year old male with a history of metastatic urothelial carcinoma, OA, HTN, MDD, gout referred to Palliative Care clinic for symptom management and psychosocial support.    Interval Events:  -Stephen [name] was admitted to [facility] from 01/29/23-02/06/23.  Per review of note, had possible opioid overdose and was given Narcan.  Also was found to have possible PNA, treated with antibiotic course, and NSTEMI and AKI.  Methadone discontinued.  Stephen [name] had a follow up with Oncology (Dr. Nicole Kindred) on 02/09/23.  Planned to continue enfortumab and pembrolizumab.  Stephen [name] underwent port placement on 02/13/23.    Subjective:    Today we met with patient in palliative care clinic. We introduced him to our team and described our role in their care. We addressed several issues during their visit:     #) Pain: See description below.   Location: bilateral shoulders and right rib area  New c/o of pain on both thighs, both knees  Quality: sharp pain on the right thigh  Severity:      Current pain score 1-10:  9 (has not taken pain medication this morning since the [place] arrived early)      Worst pain score 1-10: 10      Best daily pain score 1-10: did not discuss  Duration/Timing: constant  Modifying factors       Aggravating factors: "from everything"       Alleviating factors: oxycodone IR; feels hopeful that radiation may help him    -Not taking acetaminophen  -Takes pregabalin '100mg'$  PO nightly   -Taking duloxetine '20mg'$  PO daily  -Not taking tizanidine  -Has oxycodone IR '10mg'$  1.5 tablets to 2 tablets ('15mg'$  to '20mg'$ ) PO q4h PRN pain, only taking 1 tablet as needed and takes this infrequently due to concerns about his possible overdose in 01/2023  -He is unsure why he may have had a possible overdose.  Was not feeling unwell the day before this occurred. He states he had taken methadone '5mg'$  PO q12h for  a few days but actually did not take the day prior to the hospital stay.  He does not believe he accidentally took medications incorrectly and states he organizes all of his medications in a pill box.  He denies any suicide attempt or suicidal ideation.  Organizes his own medications.  Resistant to the idea of his sister helping with his medications but he is open to me reaching out to her on the phone to discuss a safety plan for him.  He does not feel duloxetine and pregabalin really help him and is agreeable to weaning off of these, although states that he worries he was hospitalized because he hadn't taken them in the few days prior to the hospitalization (believes the discontinuation of these medications somehow caused him to become sedated/obtunded).  -Also has followed with Pain Management (Dr. [name])  -Has had morphine IV in the hospital in the past, has never had a bad reaction  -Has had Fentanyl IV in the hospital in the past with no adverse reactions, did not help that much with pain  -Has also had hydromorphone IV in the past in the hospital  -Denies any adverse effects of the oxycodone IR such as sedation, lethargy, myoclonus or pruritus  -States he is not currently taking lorazepam or diazepam anymore (noted these prescriptions remotely on  CURES).  UDS at [facility] did not show any benzodiazepines  -Despite reporting high pain levels, does not want to change doses or add any additional medications today  -Per CURES: pregabalin '100mg'$  #30 on 02/08/23, methadone '5mg'$  #15, oxycodone IR '10mg'$  #90 on 01/20/23    #) Bowel movements:  -Taking docusate '100mg'$  PO 1-2 times a day  -Taking Senna 1-2 tablets PO daily  -Has Miralax at home, has not started it  -Has constipation, has been having BM every 2-3 days    #) Nausea:  -Reports nausea  -Plans to see dentist this week due to gum issues, using denture glue and worries it is causing gum problems and may be contributed to nausea  -Takes prochlorperazine '10mg'$  PO q6h  PRN for nausea, he does feel that this helps  -Denies vomiting    #) Nutrition:  -Reports very poor appetite  -Has lost 11kg since last visit with me  -Current weight: 74.2kg  -Worked with Nutrition to get supplements/shakes (has already gotten some and is trying to take these daily)  -Has Nutrition referral, does plan to see them    #) Insomnia:  -Takes trazodone '50mg'$  1/2 tablet to 1 tablet ('25mg'$  to '50mg'$ ) PO nightly, helps with sleep, but has not been taking recently due to feeling concerned about the possible overdose that happened.  Shared that he can try melatonin as well if he would like.    #) Fatigue and weakness:  -He is able to get up out of bed for a few hours a day  -Notes that he is beginning to feel wobbly on his legs    #) Urothelial carcinoma:  -Currently on treatment with EV/pembrolizumab and planned for radiation soon.  Would still like to pursue this.  States he is overall tolerating treatment well.    #) History of pneumonia:  -Was thought to have pneumonia while hospitalized at [facility], was given course of antibiotics.  Denies any shortness of breath, coughing or wheezing today    #) Nephrostomy tube:  -Sister helps with dressings for nephrostomy tube  -Has been noticing clear drainage from nephrostomy, no hematuria or abnormal color    #) Psychosocial domain:  -Open to our team reaching out to his sister Stephen [name] to discuss a safety plan for him.  -Sister works as a Media planner for [employer] and works at home.  Gets along well with his sister and takes care of her dog for her.  Does not get along well with brother in law.  -Has been receiving home health with [place] (speech therapy, physical therapy)    #) Advance care planning:  -Health care agent: Stephen [name] (sister)    ROS: Symptom Assessment      02/27/2023     8:00 AM 02/17/2023    10:44 AM 02/10/2023    12:34 PM 01/27/2023    11:04 AM 01/20/2023    11:23 AM 11/04/2021     1:42 PM   Palliative Care Symptom Assessment    Pain Level 9        Constipation 4        Tiredness 9        Nausea 8        Depression 3        Anxiety 3        Drowsiness 3        Appetite 7        Wellbeing 3        Dyspnea  3        Insomnia 7        ECOG  '1 2 2 1    '$ Financial Distress 1        Spiritual Pain 1        Palliative performance scale       70       Additional ROS reviewed in detail and negative:  Review of Systems   Constitutional:  Positive for appetite change, fatigue and unexpected weight change. Negative for chills, diaphoresis and fever.   HENT:   Positive for trouble swallowing. Negative for sore throat and voice change.    Eyes:  Negative for icterus.   Respiratory:  Negative for chest tightness, cough, hemoptysis, shortness of breath and wheezing.    Cardiovascular:  Negative for leg swelling and palpitations.        +Rib pain   Gastrointestinal:  Positive for constipation and nausea. Negative for abdominal pain, blood in stool, diarrhea and vomiting.   Genitourinary:  Positive for difficulty urinating. Negative for dysuria and frequency.    Musculoskeletal:  Positive for arthralgias, back pain and gait problem.   Skin:  Negative for rash.   Neurological:  Positive for extremity weakness and gait problem. Negative for light-headedness and speech difficulty.   Psychiatric/Behavioral:  Positive for sleep disturbance. Negative for suicidal ideas.         Outpatient Palliative Regimen:  Acetaminophen '1000mg'$  PO q8h PRN pain (not taking)  Duloxetine '20mg'$  PO daily  Docusate '100mg'$  PO BID (taking 1 time per day)  Oxycodone IR '10mg'$  1.5 to 2 tablets ('15mg'$  to '20mg'$ ) PO Q4H PRN pain (actually is only taking 1 tablet as needed)  Prochlorperazine '10mg'$  PO q6h PRN nausea  Tizanidine '2mg'$  PO q8h PRN muscle spasms (not taking)  Trazodone '50mg'$  1/2 tablet to 1 tablet ('25mg'$  to '50mg'$ ) PO nightly   Pregabalin 1 '00mg'$  PO nightly   Miralax 17gm PO daily PRN constipation  Narcan PRN      Conversion to Oral Morphine Equivalent = 0 to 15    Allergies & Reactions: Review  of patient's allergies indicates Sulfa drugs    Medications: (reviewed today)  Current Outpatient Medications on File Prior to Visit   Medication Sig Dispense Refill    acetaminophen (TYLENOL) 500 MG tablet Take 2 tablets (1,000 mg) by mouth every 8 hours as needed for Mild Pain (Pain Score 1-3) or Moderate Pain (Pain Score 4-6). 40 tablet 0    allopurinol (ZYLOPRIM) 100 MG tablet Take 1 tablet (100 mg) by mouth daily. 30 tablet 0    docusate sodium (COLACE) 100 MG capsule Take 1 capsule (100 mg) by mouth 2 times daily as needed for Constipation. 30 capsule 2    DULoxetine (CYMBALTA) 20 MG CR capsule Take 1 capsule (20 mg) by mouth daily. 90 capsule 3    [DISCONTINUED] gabapentin (NEURONTIN) 100 MG capsule Take 1 capsule (100 mg) by mouth 3 times daily for 7 days. Start taking after your procedure is completed. 21 capsule 0    ketotifen (ALAWAY) 0.025 % ophthalmic solution Place 1 drop into both eyes 2 times daily.      [DISCONTINUED] Loratadine (ALAVERT PO) Take 10 mg by mouth daily.      Multiple Vitamins-Minerals (MULTIVITAMIN WITH MINERALS) TABS tablet Take 1 tablet by mouth daily. 30 tablet 1    naloxone (NARCAN) 4 mg/0.1 mL nasal spray For suspected opioid overdose, call 911! Then spray once in one nostril. Repeat after 3 minutes if  no or minimal response using a new spray in other nostril. 2 each 0    oxyCODONE (ROXICODONE) 10 MG tablet Take 1.5 tablets to 2 tablets ('15mg'$  to '20mg'$ ) by mouth every 4 hours as needed for pain 90 tablet 0    polyethylene glycol (MIRALAX) 17 g packet Take 1 packet (17 g) by mouth daily as needed for Constipation. IN 120 - 240 ML OF FLUID 1 each 2    pregabalin (LYRICA) 100 MG capsule Take 1 capsule (100 mg) by mouth at bedtime. 30 capsule 2    prochlorperazine (COMPAZINE) 10 MG tablet Take 1 tablet (10 mg) by mouth every 6 hours as needed (Nausea/Vomiting). 30 tablet 5    senna (SENOKOT) 8.6 MG tablet Take 2 tablets (17.2 mg) by mouth daily. 180 tablet 2    tamsulosin (FLOMAX) 0.4  MG capsule TAKE 1 CAPSULE BY MOUTH EVERY DAY 90 capsule 1    traZODone (DESYREL) 50 MG tablet Take 0.5 tablets (25 mg) by mouth at bedtime. 30 tablet 0    triamcinolone (KENALOG) 0.1 % cream Apply 1 Application. topically 2 times daily. Apply a thin layer as directed       No current facility-administered medications on file prior to visit.       Past Medical History:  Past Medical History:   Diagnosis Date    Chronic back pain     Congenital hydronephrosis     Gout     Headache     Hematuria     HTN (hypertension) 12/10/2021    Kidney disease     Kidney stones     Major depressive disorder, single episode     Polyarthropathy or polyarthritis of multiple sites     Retinal detachment     Urethral stricture        Past Surgical History:  Past Surgical History:   Procedure Laterality Date    CT INSERTION OF SUPRAPUBIC CATH  09/25/2015    NEPHRECTOMY Right 1955    APPENDECTOMY      COLONOSCOPY      CYSTOSCOPY      CYSTOSCOPY W/ LASER LITHOTRIPSY      OTHER SURGICAL HISTORY      Interstim 01/29/2011    SPINE SURGERY  09/21    Lumbar-sacral fusion    TRANSURETHRAL RESECTION OF PROSTATE         Social History:  Healthcare agent/surrogate decision-maker: Stephen [name] (sister)  Social History     Social History Narrative    Not on file        Family History:  Family History   Adopted: Yes   Family history unknown: Yes       PEX:  Vitals:    02/27/23 0819   BP: 112/62   BP Location: Left arm   BP Patient Position: Sitting   BP cuff size: Regular   Pulse: 76   Resp: 16   Temp: 97.6 F (36.4 C)   TempSrc: Temporal   SpO2: 98%   Weight: 74.2 kg (163 lb 8 oz)   Height: 5' 6.14" (1.68 m)     General: No acute distress, thin, sitting in chair  HEENT: PERRLA, EOMI, no conjunctivitis or scleral icterus noted.   Nares patent.  Neck:  Trachea midline.  Lungs: Clear to auscultation bilaterally, no wheezes, no rhonchi, no rales  CV: Regular rate and rhythm, no murmurs, no rubs, no gallops  Abdomen: Soft, nondistended, nontender to  palpation, no rebound or guarding, normoactive bowel sounds in  all four quadrants, no CVAT  Neuro: Oriented x3, no dysarthria, moving all four extremities spontaneously, 5/5 strength throughout  Psych: Thought process linear and goal-directed, thought content appropriate, affect full range and appropriate, denies suicidal ideation.  No evidence of hallucinations or delusions  Skin: Warm and well perfused.  Brisk capillary refill.      Pertinent Labs:     Lab Results   Component Value Date    WBC 6.1 02/17/2023    RBC 3.60 (L) 02/17/2023    HGB 10.0 (L) 02/17/2023    HCT 30.6 (L) 02/17/2023    MCV 85.0 02/17/2023    MCHC 32.7 02/17/2023    RDW 15.7 (H) 02/17/2023    PLT 348 02/17/2023    MPV 9.4 02/17/2023       Lab Results   Component Value Date    BUN 12 02/17/2023    CREAT 1.03 02/17/2023    CL 97 (L) 02/17/2023    NA 136 02/17/2023    K 4.4 02/17/2023    [place] 8.7 02/17/2023    TBILI 0.61 02/17/2023    ALB 3.9 02/17/2023    TP 7.0 02/17/2023    AST 24 02/17/2023    ALK 331 (H) 02/17/2023    BICARB 28 02/17/2023    ALT 16 02/17/2023    GLU 111 (H) 02/17/2023       Above labs reviewed    Pertinent Diagnostic Study Findings:  PET/CT 11/26/22  IMPRESSION:  1. Left percutaneous nephrostomy in place. Nonspecific soft tissue thickening and inflammatory stranding surrounding the left renal collecting system and proximal left ureter is incompletely assessed with this technique. Metabolic analysis is particularly technically limited given intense hypermetabolic activity related to excreted radiotracer.  2. Multiple small adjacent lymph nodes predominantly in the retroperitoneal region demonstrate metabolic activity at or slightly above blood pool and are nonspecific.  3. Diffuse marrow hypermetabolic activity with multiple discrete hypermetabolic osseous lesions. Background of diffuse marrow hypermetabolic activity is nonspecific and could represent sequela of therapy. However, a diffusely infiltrative process is not  excluded. Discretely hypermetabolic osseous lesions are concerning for osseous metastatic disease.  4. Focal hypermetabolic activity associated with soft tissue prominence along the left lateral aspect of the distal sigmoid colon. Underlying colonic lesion is not excluded. Attention on follow-up is recommended. Finding could be further evaluated with colonoscopy if clinically appropriate.    R Shoulder XR 12/15/22  IMPRESSION:  No fracture. Focal lytic glenoid lesion with high uptake seen on recent PET is barely visible on x-ray. Mild degenerative changes.    Above findings reviewed    ASSESSMENT: Shaqwan Hoak is a 72 year old male with a history of metastatic urothelial carcinoma, OA, HTN, MDD, gout referred to Palliative Care clinic for symptom management and psychosocial support.    Medical chart reviewed in detail.  Above labs reviewed and notable for anemia, elevated alkaline phosphatase  Above diagnostic images and reports reviewed by me personally and notable for left percutaneous nephrostomy, nonspecific soft tissue thickening and inflammatory stranding surrounding the left renal collecting systema nd proximal left ureter; diffuse marrow hypermetabolic activity.  Reported also had CT Chest at [facility] which was negative for PE.    Diagnoses and all orders for this visit:    Metastatic urothelial carcinoma (CMS-HCC)    Cancer related pain    Neuropathic pain    Constipation, unspecified constipation type    Nausea    Protein-calorie malnutrition, unspecified severity (CMS-HCC)    Weight loss  Poor appetite    Insomnia, unspecified type    Fatigue, unspecified type    Debility    History of pneumonia    Palliative care by specialist        Since last visit, patient had a possible opioid overdose and was hospitalized at [facility].  I am unclear exactly how this occurred, as he states he was taking his medications as prescribed and in fact had not been taking the methadone the day prior to the  hospitalization.  He denies any suicidal ideation and states it was not a suicide attempt.  He was found to have possible pneumonia as well as sepsis which may have contributed to this episode.  Patient expresses desire to minimize medications as much as he can to avoid a similar episode.  He is open to our team reaching out to his sister Stephen [name] for safety planning.    PLAN:  - discontinue methadone (has already been discontinued)  - change oxycodone IR to '10mg'$  PO q4h PRN pain  - continue acetaminophen '1000mg'$  PO q8h PRN pain  - discontinue duloxetine as patient reports he doesn't believe it is helping his symptoms  - continue pregabalin '100mg'$  PO nightly, monitor renal function closely.  Patient reports questionable benefit, will consider weaning him off of this in the future  - CURES reviewed  - Narcan PRN prescribed today and we discussed how/when to use in emergencies  - continue Senna 2 tablets PO daily  - continue docusate '100mg'$  PO BID  - start Miralax 17gm PO daily PRN constipation (patient already has at home)  - continue prochlorperazine '10mg'$  PO q6h PRN nausea  - continue trazodone '50mg'$  1 to 1.5 tablets ('50mg'$  to '75mg'$ ) PO nightly PRN insomnia.  Have also counseled patient he can try melatonin.  - continue follow up with Pain Management  - Agree with Radiation Oncology evaluation  - Agree with Nutrition evaluation  - Agree with home health  - please mychart message or call the palliative care clinic regarding symptom management    Follow up in palliative care clinic on 04/10/23 at 2 PM in person at [facility] (60 minutes)    Patient evaluated with the following [facility] Service Members:  Artelia Laroche, LVN  Leanna Battles, LCSW    Thank you for your consult.    Leisa Lenz, MD  Attending Physician  [facility]    Total Attending time 61 min:  61 min spent performing physical examination, counseling patient, and coordinating their care with medical team.

## 2023-02-28 ENCOUNTER — Telehealth (HOSPITAL_BASED_OUTPATIENT_CLINIC_OR_DEPARTMENT_OTHER): Payer: Self-pay

## 2023-02-28 ENCOUNTER — Encounter (HOSPITAL_BASED_OUTPATIENT_CLINIC_OR_DEPARTMENT_OTHER): Payer: Self-pay | Admitting: Hematology & Oncology

## 2023-02-28 ENCOUNTER — Ambulatory Visit
Admission: RE | Admit: 2023-02-28 | Discharge: 2023-02-28 | Disposition: A | Discharge status: Home / Self Care | Payer: Medicare Other | Attending: Hematology & Oncology | Admitting: Hematology & Oncology

## 2023-02-28 VITALS — BP 131/62 | HR 68 | Temp 98.0°F | Resp 16 | Ht 66.14 in | Wt 165.3 lb

## 2023-02-28 DIAGNOSIS — C662 Malignant neoplasm of left ureter: Secondary | ICD-10-CM

## 2023-02-28 DIAGNOSIS — Z5189 Encounter for other specified aftercare: Secondary | ICD-10-CM

## 2023-02-28 DIAGNOSIS — C642 Malignant neoplasm of left kidney, except renal pelvis: Secondary | ICD-10-CM | POA: Insufficient documentation

## 2023-02-28 LAB — CBC WITH DIFF, BLOOD
ANC-Automated: 3.1 10*3/uL (ref 1.6–7.0)
ANC-Instrument: 3.1 10*3/uL (ref 1.6–7.0)
Abs Basophils: 0 10*3/uL (ref <0.2)
Abs Eosinophils: 0.2 10*3/uL (ref 0.0–0.5)
Abs Lymphs: 1.2 10*3/uL (ref 0.8–3.1)
Abs Monos: 0.7 10*3/uL (ref 0.2–0.8)
Basophils: 1 %
Eosinophils: 4 %
Hct: 29.9 % — ABNORMAL LOW (ref 40.0–50.0)
Hgb: 9.8 gm/dL — ABNORMAL LOW (ref 13.7–17.5)
Lymphocytes: 23 %
MCH: 28.3 pg (ref 26.0–32.0)
MCHC: 32.8 g/dL (ref 32.0–36.0)
MCV: 86.4 um3 (ref 79.0–95.0)
MPV: 9.1 fL — ABNORMAL LOW (ref 9.4–12.4)
Monocytes: 14 %
Plt Count: 283 10*3/uL (ref 140–370)
RBC: 3.46 10*6/uL — ABNORMAL LOW (ref 4.60–6.10)
RDW: 15.8 % — ABNORMAL HIGH (ref 12.0–14.0)
Segs: 58 %
WBC: 5.3 10*3/uL (ref 4.0–10.0)

## 2023-02-28 LAB — COMPREHENSIVE METABOLIC PANEL, BLOOD
ALT (SGPT): 13 U/L (ref 0–41)
AST (SGOT): 24 U/L (ref 0–40)
Albumin: 3.6 g/dL (ref 3.5–5.2)
Alkaline Phos: 195 U/L — ABNORMAL HIGH (ref 40–129)
Anion Gap: 12 mmol/L (ref 7–15)
BUN: 12 mg/dL (ref 8–23)
Bicarbonate: 27 mmol/L (ref 22–29)
Bilirubin, Tot: 0.6 mg/dL (ref <1.2)
Calcium: 9 mg/dL (ref 8.5–10.6)
Chloride: 97 mmol/L — ABNORMAL LOW (ref 98–107)
Creatinine: 1.1 mg/dL (ref 0.67–1.17)
Glucose: 132 mg/dL — ABNORMAL HIGH (ref 70–99)
Potassium: 4.2 mmol/L (ref 3.5–5.1)
Sodium: 136 mmol/L (ref 136–145)
Total Protein: 6.9 g/dL (ref 6.0–8.0)
eGFR Based on CKD-EPI 2021 Equation: >60 mL/min/{1.73_m2}

## 2023-02-28 MED ORDER — HEPARIN SODIUM LOCK FLUSH 100 UNIT/ML IJ SOLN CUSTOM
500.0000 [IU] | INTRAVENOUS | Status: DC | PRN
Start: 2023-02-28 — End: 2023-03-01
  Administered 2023-02-28: 500 [IU] via INTRAVENOUS
  Filled 2023-02-28: qty 5

## 2023-02-28 MED ORDER — SODIUM CHLORIDE 0.9 % IV BOLUS
1000.0000 mL | INJECTION | Freq: Once | INTRAVENOUS | Status: AC
Start: 2023-02-28 — End: 2023-02-28
  Administered 2023-02-28: 1000 mL via INTRAVENOUS

## 2023-02-28 MED ORDER — LIDOCAINE HCL (PF) 1 % IJ SOLN
0.3000 mL | INTRAMUSCULAR | Status: DC | PRN
Start: 2023-02-28 — End: 2023-03-01
  Administered 2023-02-28: 0.3 mL via INTRADERMAL
  Filled 2023-02-28: qty 2

## 2023-02-28 MED ORDER — NEPRO OR LIQD
3.0000 | Freq: Every day | ORAL | 11 refills | Status: AC
Start: 2023-02-28 — End: ?

## 2023-02-28 MED ORDER — SODIUM CHLORIDE 0.9 % IJ SOLN (CUSTOM)
20.0000 mL | INTRAMUSCULAR | Status: DC | PRN
Start: 2023-02-28 — End: 2023-03-01

## 2023-02-28 NOTE — Telephone Encounter (Signed)
Dr. Nicole Kindred and [place],     Ved Mackellar is scheduled for infusion of Padcev/Keytruda on 03/15.     Orders are currently unsigned; signed orders are required to avoid cancellation of appointment.   If orders remain unsigned, the patient will be contacted 24 hours prior to their scheduled appointment notifying them of cancellation. The infusion appointment will then be rescheduled after orders are signed.     Patient has a scheduled clinic visit on 03/13.    Thank you,   Delsa Sale, RN Lewanda Rife

## 2023-02-28 NOTE — Interdisciplinary (Signed)
Hydration Nursing Note - [facility]    Stephen [name] is a 72 year old male who presents for hydration.    Vitals:    02/28/23 1243   BP: 131/62   BP Location: Left arm   BP Patient Position: Sitting   Pulse: 68   Resp: 16   Temp: 98 F (36.7 C)   TempSrc: Temporal   SpO2: 99%   Weight: 75 kg (165 lb 5.5 oz)   Height: 5' 6.14" (1.68 m)     Body surface area is 1.87 meters squared.  Body mass index is 26.57 kg/m.    Treatment Plan  Medications   lidocaine (XYLOCAINE) 1% PF injection 0.3 mL (0.3 mL IntraDERMAL Given 02/28/23 1250)   sodium chloride 0.9 % flush 20 mL (has no administration in time range)   heparin (HEP-LOCK) flush injection 500 Units (500 Units IntraVENOUS Given 02/28/23 1400)   sodium chloride 0.9 % bolus 1,000 mL (0 mL IntraVENOUS Completed 02/28/23 1400)       IV Infusion Information:  IV access: Right Port-A-Cath   Gauge: 20 x 3/4  IV site assessment: Within Normal Limits  Blood return: Brisk  Flush: NS and Heparin 500 units/ml      Patient Education  Learner: Patient  Readiness to learn: Acceptance  Method: Explanation    Treatment Education: Information/teaching given to patient including: signs and symptoms of infection, bleeding, adverse reaction(s), symptom control, and when to notify MD.    Lytle Michaels Prevention Education: Instructed patient to call for assistance.    Pain Education: Patient instructed to contact nurse if pain should develop or if their current pain therapy becomes ineffective.    Response: Verbalizes understanding    Discharge Plan  Discharge instructions given to patient.  Future appointments given and reviewed with treatment plan.  Discharge Mode: Ambulatory w/cane  Discharge Time: 1410  Accompanied by: Self  Discharged To: Home

## 2023-02-28 NOTE — Addendum Note (Signed)
Encounter addended by: Shela Commons on: 02/28/2023 5:07 PM   Actions taken: Flowsheet accepted, Charge Capture section accepted

## 2023-03-01 ENCOUNTER — Ambulatory Visit: Payer: Medicare Other | Attending: Physician Assistant | Admitting: Physician Assistant

## 2023-03-01 ENCOUNTER — Encounter (HOSPITAL_BASED_OUTPATIENT_CLINIC_OR_DEPARTMENT_OTHER): Payer: Self-pay | Admitting: Physician Assistant

## 2023-03-01 DIAGNOSIS — G893 Neoplasm related pain (acute) (chronic): Secondary | ICD-10-CM

## 2023-03-01 DIAGNOSIS — C7910 Secondary malignant neoplasm of unspecified urinary organs: Secondary | ICD-10-CM | POA: Insufficient documentation

## 2023-03-01 MED ORDER — OXYCODONE HCL 10 MG OR TABS
ORAL_TABLET | ORAL | 0 refills | Status: DC
Start: 2023-03-01 — End: 2023-03-29

## 2023-03-01 NOTE — Progress Notes (Signed)
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    Stage: Cancer Staging   Ureter malignant neoplasm, left (CMS-HCC)  Staging form: Renal Pelvis And Ureter, AJCC 8th Edition  - Clinical: Stage IV (cTX, cN0, pM1) - Signed by Doreen Beam, MD on 12/01/2022    Oncologic History:    04/22/21  CTU: filling defect in L renal pelvis  05/05/21  Cysto with L ureteroscopy revealed a L papillary tumor, biopsied: mixed high (30%) and low grade urothelial carcinoma  06/16/21  Repeat cysto, papillary tumor: pathology low grade papillary   09/xx/22  Completed induction with mitogel (6 cycles)  08/19/21  HGTa of the bladder, no muscle  08/26/21  Repeat TURBT: NED, muscle identified  10/26/21  MRU: mild thickening in the L renal pelvis  12/24/21  Cysto/L ureteroscopy: dysplasia in the bladder; no disease in L ureter  05/06/22  L PCN placed  06/01/22  TURBT and L ureteroscopy: NED, stricture in place, stent placed  09/29/22  TURBT and L ureteroscopy: NED  10/22/22  MRU: Mild diffuse thickening in the L ureter/renal pelvis. Multiple osseous lesions involving the bilateral iliac crest and right acetabulum concerning for osseous metastatic disease.   11/26/22  PET/CT: diffuse osseous FDG activity with discrete osseous lesions concerning for metastatic disease.  12/22/22  L iliac crest biopsy: metastatic urothelial carcinoma  01/09/23  Seen by ortho, recommend palliative XRT  01/17/23  Seen by rad onc, simulation planned for 2/02  01/20/23  C1D1 EV 1.25 mg/kg D1,8 of 21 day cycle + pembrolizumab 200 mg IV D1 of 21 day cycle  02/11-02/19  Admitted to [facility] with AMS and resp failure 2/2 opiates.  Also treated for septic shock, AKI (Cr 2.5) and acute liver injury (AST/ALT 2800/2100).   S/p exchange L PCN.   02/07/23  start XRT to R Shoulder  02/10/23  C2D1 EV (dose reduced to 1.0 mg/kg) + pembro  200 mg IV  02/17/23  C2D8 EV/pembro    Interim History:  Here prior to C3 EV+P  No fever/chills.  No appetite. Not eating/drinking; losing weight. Lost 3-4 lbs since last OV. Seen by nutrition; not drinking recommended diet supplements as often as he should be.  "My whole body hurts."  R arm pain has improved, now 6-7/10 (was much higher) - still has not completed palliative xrt.  Taking oxycodone 1 -2 tabs daily; no more methadone  BM's once every few days; not constipated, no diarrhea  Denies neuropathy but says his hands and feet feel weak.  Reports he "can't walk well" d/t lower back pain and stiffness which flares intermittently  No change in meds.    Review of Systems:   A complete ROS was performed and is negative except as documented above.     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 40 tablet 0    allopurinol (ZYLOPRIM) 100 MG tablet Take 1 tablet (100 mg) by mouth daily. 30 tablet 0    docusate sodium (COLACE) 100 MG capsule Take 1 capsule (100 mg) by mouth 2 times daily as needed for Constipation. 30 capsule 2    DULoxetine (CYMBALTA) 20 MG CR capsule Take 1 capsule (20 mg) by mouth daily. 90 capsule 3    [DISCONTINUED] gabapentin (NEURONTIN) 100 MG capsule Take 1 capsule (100 mg) by mouth 3 times daily for 7 days. Start taking after your procedure is completed. 21 capsule 0    ketotifen (ALAWAY) 0.025 % ophthalmic solution Place 1 drop into  both eyes 2 times daily.      [DISCONTINUED] Loratadine (ALAVERT PO) Take 10 mg by mouth daily.      Multiple Vitamins-Minerals (MULTIVITAMIN WITH MINERALS) TABS tablet Take 1 tablet by mouth daily. 30 tablet 1    naloxone (NARCAN) 4 mg/0.1 mL nasal spray For suspected opioid overdose, call 911! Then spray once in one nostril. Repeat after 3 minutes if no or minimal response using a new spray in other nostril. 2 each 0    Nutritional Supplements (NEPRO) LIQD Take 3 Cans by mouth daily. 30000 mL 11    nystatin (MYCOSTATIN) 100,000 units/mL suspension Take 5 mL (500,000 Units) by mouth 4 times daily. 280 mL 0    oxyCODONE (ROXICODONE) 10 MG tablet Take 1.5 tablets to 2 tablets ('15mg'$  to '20mg'$ ) by mouth every 4 hours as needed for pain 90 tablet 0    polyethylene glycol (MIRALAX) 17 g packet Take 1 packet (17 g) by mouth daily as needed for Constipation. IN 120 - 240 ML OF FLUID 1 each 2    pregabalin (LYRICA) 100 MG capsule Take 1 capsule (100 mg) by mouth at bedtime. 30 capsule 2    prochlorperazine (COMPAZINE) 10 MG tablet Take 1 tablet (10 mg) by mouth every 6 hours as needed (Nausea/Vomiting). 30 tablet 5    senna (SENOKOT) 8.6 MG tablet Take 2 tablets (17.2 mg) by mouth daily. 180 tablet 2    tamsulosin (FLOMAX) 0.4 MG capsule TAKE 1 CAPSULE BY MOUTH EVERY DAY 90 capsule 1    traZODone (DESYREL) 50 MG tablet Take 0.5 tablets (25 mg) by mouth at bedtime. 30 tablet 0    triamcinolone (KENALOG) 0.1 % cream Apply 1 Application. topically 2 times daily. Apply a thin layer as directed       Current Facility-Administered Medications on File Prior to Visit   Medication Dose Route Frequency Provider Last Rate Last Admin    [DISCONTINUED] heparin (HEP-LOCK) flush injection 500 Units  500 Units IntraVENOUS PRN Doreen Beam, MD   500 Units at 02/28/23 1400    [DISCONTINUED] lidocaine (XYLOCAINE) 1% PF injection 0.3 mL  0.3 mL IntraDERMAL PRN Doreen Beam, MD   0.3 mL at 02/28/23 1250    [COMPLETED] sodium  chloride 0.9 % bolus 1,000 mL  1,000 mL IntraVENOUS Once Doreen Beam, MD   Completed at 02/28/23 1400    [DISCONTINUED] sodium chloride 0.9 % flush 20 mL  20 mL IntraVENOUS PRN Doreen Beam, MD         Physical Examination:   03/01/23  1335   BP: 107/66   Pulse: 75   Temp: 97.6 F (36.4 C)   Resp: 16  SpO2: 99%     ECOG PFS 1  General: Looking better today, still fatigued???????. Walks independently but uses a cane intermittently.   HEENT:  Moist mucous membranes without oral lesions.  Chest: Clear to ausculation bilaterally. No wheezes, rales or rhonchi.    Cardiac: Regular rate and rhythm without any murmurs, rubs or gallops.   Abdomen: Abd soft, nontender without any hepatosplenomegaly.   Extremities: No clubbing, cyanosis, or edema. Previous R posterior shoulder mass decreased in size. Incr R shoulder ROM.    Skin: No rashes or lesions.   Neurological: Awake, alert and oriented x 3. Attention, language and memory are intact. Strength and sensation are grossly intact.   L PCN draining clear, yellow urine. Clean dressing in place.     Labs: Following labs reviewed  Lab Results   Component Value Date    WBC 5.3 02/28/2023    RBC 3.46 (L) 02/28/2023    HGB 9.8 (L) 02/28/2023    HCT 29.9 (L) 02/28/2023    MCV 86.4 02/28/2023    MCHC 32.8 02/28/2023    RDW 15.8 (H) 02/28/2023    PLT 283 02/28/2023    MPV 9.1 (L) 02/28/2023      Lab Results   Component Value Date    BUN 12 02/28/2023    CREAT 1.10 02/28/2023    CL 97 (L) 02/28/2023    NA 136 02/28/2023    K 4.2 02/28/2023    [place] 9.0 02/28/2023    TBILI 0.60 02/28/2023    ALB 3.6 02/28/2023    TP 6.9 02/28/2023    AST 24 02/28/2023    ALK 195 (H) 02/28/2023    BICARB 27 02/28/2023    ALT 13 02/28/2023    GLU 132 (H) 02/28/2023                Assessment:  Pinches Zarrella is a 72 year old male with OA, HTN, MDD with L UTUC with metastatic disease [id number]), with osseous mets (Pet/CT with diffuse enhancement and discrete lesions). FGFR-TACC  mutation.  Started on EV+Pembro.    Labs from 3/12 reviewed today in clinic.   No new imaging to review from today's clinic.     Patient presents today for follow-up.  Doing fair.  Appetite low, still losing weight. Needs to see dentist for properly fitting dentures. Seen by Nutrition - needs to adhere to dietary supplement recommendations. R arm pain has significantly improved; still needs to complete palliative XRT - delayed d/t scheduling issues. Pt agreed to call about this asap. Cont oxycodone '5mg'$  prn for pain control.  We had a lengthy discussion about SE's of enfortumab vedotin to include fatigue, malaise, and loss of appetite which have been a sig issue for him. Reviewed need for po intake to maintain strength and energy. Discussed option to delay upcoming infusion to allow for additional recovery. Pt motivated to move forward with infusion as scheduled this Friday 3/15. Low threshold to hold D8 infusion if appetite/po intake cont to wane; pt verbalized understanding.         Advanced Urothelial Carcinoma, [id number]  - Port placement planned 2/26  - Proceed with C3 on 3/15  - Treatment plan:  --- Enfortumab vedotin 1.25 --> 1.0 mg/kg (dose reduced prior to C2) D1, D8 Q21d  --- Pembrolizumab 200 mg D1 Q21d  --- prn anti-nausea meds  --- prn antidiarrheal medications  --- prn topical skin care  --- ED precautions  - RTC 4/04 with Dr. Nicole Kindred prior to  next infusion  - consider adding zometa given significnt bone disease; needs vit D level checked and dental clearance    R shoulder pain  - 2/2 metastatic lesion  - seen by ortho 1/22  - needs to complete palliative radiation  - seen by palliative care team (Dr. Fannie Knee) 2/05     Cancer related pain  - seen by palliative care 2/05 prescribed:   - follow up with palliative care after recent hospitalization to optimize pain management    L PCN, initially placed 04/2022  - tube exchange planned next exchange in 04/2023  - draining well, will monitor    RTC 3  weeks      Reviewed plan with the patient.  All questions answered to patient's satisfaction.  Patient verbalized understanding and agreement with above plan.      Today's visit lasted 30 minutes - including charting, communication with other doctors or caregivers, review of labs, pertinent imaging and test results; treatment planning including note completion, order entry, and coordination of care - and consisted more than 50% of time in the face-to-face patient counseling.       Sunday Corn, PA-C  Senior Physician Assistant  [facility]    Supervising Physician:  Dr. Thomes Lolling      [facility]  [facility]  [address]  [place], [place] [address]  [phone]

## 2023-03-01 NOTE — Patient Instructions (Addendum)
Patient Instructions:    - You will have your  infusion 3/15 as scheduled.       - Continue recommendations from Nutrition. Please contact our team if your appetite does not improve.    - We have refilled your prescription for oxycodone - this was sent to your local pharmacy.     - Call the radiation department to schedule appointments as soon as possible:    Radiation Oncology Department scheduling phone numbers:  Monday-Friday 8-4:30pm Closed weekends and major [place] call [phone]   [place] call [phone]     - Return to clinic for follow-up with Dr. Nicole Kindred on April 4th.    Future Appointments   Date Time Provider [place]   03/03/2023  9:30 AM [place] [place] [facility] [place]   03/03/2023 11:00 AM [facility], [place] [facility] [place]   03/06/2023  2:00 PM Nanetta Batty, PA [facility] [facility]   03/23/2023 12:00 PM Doreen Beam, MD [facility]   04/10/2023  2:00 PM Leisa Lenz, MD [place] [facility] [place]       Have questions about Advanced Care Planning?  PREPARE ([email or web address])     For [place] lab locations, please visit  [email or web address]  *If Labs completed outside of [place], it is patient's responsibility to bring a copy of results or send lab results to clinic office.     *[place] [facility] Resource Website:  [email or web address]    Contact Information:   Thomes Lolling, MD   Assistant Professor of [facility]  Genitourinary Malignancy    Sunday Corn, M.S., PA-C  Sr. Physician Assistant    Clinical questions or concerns  Oncology Nurse Case Manager   [name] RN, BSN  T: [phone]  Department of Urology   [facility]  [place], [place] [id number]    Dr. Nicole Kindred Follow [place]   [place] Curling  T: [phone]  F: [phone]    Monday-Friday  8:00- 4:30p.m. (Closed Holidays and Weekends)  Please listen to message carefully for daily information.   Leave a detailed message with your name, medical record number and   telephone number. Messages are usually returned within 24 hours.     [place] Phone List     AFTER HOURS EMERGENCY NUMBER   Ask for the Oncologist Physician On-Call.                   [place] Scheduling line: [phone]  Call for information about our new location and to schedule or cancel appointments.    [facility] Scheduling line: [phone]   Call to schedule your [name], CT Scans or MRI studies.    PET Scan Scheduling line: [phone]  Call to schedule your PET scan    Prostate MRI Scheduling line: [phone] or [phone]  Call to schedule your prostate MRI.   Performed at the [facility] ([place]).    Nuclear Medicine [place] [phone]   Call to schedule your bone scan, [facility] [place]: [place]   Call to schedule, cancel, or reschedule chemotherapy appointments.  Infusion Center Hours--- Monday-Friday 7:00-7:30 p.m.; Sat-Sun 8:00-4:30pm.    [place] ([address]): [phone]  Monday-Friday 730-530 p.m.; Closed Weekends  [place]: [phone]  Monday-Friday 8:00-5:00 p.m.  Mail order options available     Radiation Oncology: [phone]    Call to schedule, cancel, or reschedule radiation appointments    Social Worker  [place]: Agnes Lawrence, [place]    T: [phone]  [place]: Omar Person, LCSW  T: [phone]    Financial Counselors   [phone] at [facility]   [phone] or [phone]    [facility]  [address], [place] [address]    -You can also reach [name] by [place]. To set up your MyChart please see the following site: [email or web address]  -For cancer support  services, please visit out Patient and [place] support services, please visit:  -[email or web address]  -For other support from the [place], please visit: [email or web address]  -Advanced care planning: [email or web address]      Thank you for allowing [name] to participate in your care today! We value your feedback as we strive to provide every patient with an exceptional care experience.     You may receive a patient satisfaction survey in the mail in the next few weeks. Please fill out the survey upon receipt and return it in the self-addressed envelope. Your feedback will be used to help improve the experience for all our patients at [place].     If you want to share a positive experience you had at our facility please call We Listen at [phone]. If you have any suggestions on how we can improve our services please call the [facility] leadership team at [phone].    For medical questions or emergencies, please call your physician's office or 911. Thank you!

## 2023-03-02 ENCOUNTER — Telehealth (HOSPITAL_BASED_OUTPATIENT_CLINIC_OR_DEPARTMENT_OTHER): Payer: Self-pay

## 2023-03-02 NOTE — Telephone Encounter (Signed)
Called and left message to call back.  Fast track appointment cancelled due to patient did his labs last 02/28/2023.  Pt ok to come in at 11am for infusion appointment.

## 2023-03-03 ENCOUNTER — Ambulatory Visit (HOSPITAL_BASED_OUTPATIENT_CLINIC_OR_DEPARTMENT_OTHER): Payer: Medicare Other | Admitting: Urology

## 2023-03-03 ENCOUNTER — Ambulatory Visit (HOSPITAL_BASED_OUTPATIENT_CLINIC_OR_DEPARTMENT_OTHER): Admit: 2023-03-03 | Discharge: 2023-03-03 | Disposition: A | Discharge status: Home Health | Payer: Medicare Other

## 2023-03-03 ENCOUNTER — Ambulatory Visit
Admission: RE | Admit: 2023-03-03 | Discharge: 2023-03-03 | Disposition: A | Discharge status: Home / Self Care | Payer: Medicare Other | Attending: Physician Assistant | Admitting: Physician Assistant

## 2023-03-03 VITALS — BP 111/56 | HR 59 | Temp 96.5°F | Resp 18 | Ht 66.14 in | Wt 166.2 lb

## 2023-03-03 DIAGNOSIS — C679 Malignant neoplasm of bladder, unspecified: Secondary | ICD-10-CM | POA: Insufficient documentation

## 2023-03-03 DIAGNOSIS — Z5189 Encounter for other specified aftercare: Secondary | ICD-10-CM

## 2023-03-03 DIAGNOSIS — C662 Malignant neoplasm of left ureter: Secondary | ICD-10-CM

## 2023-03-03 DIAGNOSIS — Z5112 Encounter for antineoplastic immunotherapy: Secondary | ICD-10-CM | POA: Insufficient documentation

## 2023-03-03 MED ORDER — LIDOCAINE HCL (PF) 1 % IJ SOLN
0.3000 mL | INTRAMUSCULAR | Status: DC | PRN
Start: 2023-03-03 — End: 2023-03-03
  Administered 2023-03-03: 0.3 mL via INTRADERMAL

## 2023-03-03 MED ORDER — HYDROCORTISONE SOD SUCCINATE 100 MG IJ SOLR (CUSTOM)
100.0000 mg | Freq: Once | INTRAMUSCULAR | Status: DC | PRN
Start: 2023-03-03 — End: 2023-03-03

## 2023-03-03 MED ORDER — HEPARIN SODIUM LOCK FLUSH 100 UNIT/ML IJ SOLN CUSTOM
500.0000 [IU] | INTRAVENOUS | Status: DC | PRN
Start: 2023-03-03 — End: 2023-03-03
  Administered 2023-03-03: 500 [IU] via INTRAVENOUS

## 2023-03-03 MED ORDER — SODIUM CHLORIDE 0.9 % IJ SOLN (CUSTOM)
20.0000 mL | INTRAMUSCULAR | Status: DC | PRN
Start: 2023-03-03 — End: 2023-03-03
  Administered 2023-03-03: 20 mL via INTRAVENOUS

## 2023-03-03 MED ORDER — SODIUM CHLORIDE 0.9 % IV SOLN
80.0000 mg | Freq: Once | INTRAVENOUS | Status: AC
Start: 2023-03-03 — End: 2023-03-03
  Administered 2023-03-03: 80 mg via INTRAVENOUS
  Filled 2023-03-03: qty 60

## 2023-03-03 MED ORDER — ONDANSETRON HCL 4 MG/2ML IV SOLN
8.0000 mg | Freq: Once | INTRAMUSCULAR | Status: AC
Start: 2023-03-03 — End: 2023-03-03
  Administered 2023-03-03: 8 mg via INTRAVENOUS
  Filled 2023-03-03: qty 4

## 2023-03-03 MED ORDER — SODIUM CHLORIDE 0.9 % IV SOLN
Freq: Once | INTRAVENOUS | Status: AC
Start: 2023-03-03 — End: 2023-03-03

## 2023-03-03 MED ORDER — DIPHENHYDRAMINE HCL 50 MG/ML IJ SOLN
25.0000 mg | Freq: Once | INTRAMUSCULAR | Status: DC | PRN
Start: 2023-03-03 — End: 2023-03-03

## 2023-03-03 MED ORDER — FAMOTIDINE (PF) 20 MG/2ML IV SOLN
20.0000 mg | Freq: Once | INTRAVENOUS | Status: DC | PRN
Start: 2023-03-03 — End: 2023-03-03

## 2023-03-03 MED ORDER — SODIUM CHLORIDE 0.9 % IV SOLN
200.0000 mg | Freq: Once | INTRAVENOUS | Status: AC
Start: 2023-03-03 — End: 2023-03-03
  Administered 2023-03-03: 200 mg via INTRAVENOUS
  Filled 2023-03-03: qty 8

## 2023-03-03 NOTE — Interdisciplinary (Signed)
Chemotherapy Nursing Note - [place]    Stephen [name] is a 72 year old male who presents for chemotherapy cycle 3, day(s) 1 of Padcev + Keytruda.    Current Chemotherapy Signed Informed Consent verified/obtained yes    Pre-treatment nursing assessment:  No problems identified upon assessment.  Patient arrived ambulatory with help of cane in stable condition. Vitals stable, A & O x4.  Denies pain.   Denies nausea or vomiting.   Denies diarrhea or constipation.   Denies fevers or recent illness.   Pt states he  is hydrating and eating well.       Patient met treatment parameters. PAC accessed per policy, site benign, brisk blood return obtained prior to start.  Pt premedicated as ordered.    Lab Results   Component Value Date    ABSNEUTRO 3.1 02/28/2023    IANC 3.1 02/28/2023    WBC 5.3 02/28/2023    RBC 3.46 (L) 02/28/2023    HGB 9.8 (L) 02/28/2023    HCT 29.9 (L) 02/28/2023    MCV 86.4 02/28/2023    MCHC 32.8 02/28/2023    RDW 15.8 (H) 02/28/2023    PLT 283 02/28/2023       Lab Results   Component Value Date    NA 136 02/28/2023    K 4.2 02/28/2023    CL 97 (L) 02/28/2023    BICARB 27 02/28/2023    BUN 12 02/28/2023    CREAT 1.10 02/28/2023    GLU 132 (H) 02/28/2023    [place] 9.0 02/28/2023           Lab Results   Component Value Date    AST 24 02/28/2023    ALT 13 02/28/2023    GGT 131 (H) 01/17/2022    LDH 172 01/19/2022    ALK 195 (H) 02/28/2023    TP 6.9 02/28/2023    ALB 3.6 02/28/2023    TBILI 0.60 02/28/2023    DBILI <0.2 01/19/2022        Vitals:    03/03/23 1015 03/03/23 1257   BP: (!) 111/52 (!) 111/56   BP Location: Left arm Right arm   BP Patient Position: Sitting Sitting   Pulse: 76 59   Resp: 16 18   Temp: 97.8 F (36.6 C) 96.5 F (35.8 C)   TempSrc: Temporal Temporal   SpO2: 100% 99%   Weight: 75.4 kg (166 lb 3.6 oz)    Height: 5' 6.14" (1.68 m)      Pain Score: 7  Body surface area is 1.88 meters squared.  Body mass index is 26.72 kg/m.    ECOG  ECOG: 0 Not Applicable    PHQ2  PHQ2  Total: 0  Not Applicable    Chemotherapy:  Malloy Colletta tolerated treatment well.    Post blood return: Brisk  IV access post infusion: NS, Heparin 100 units/ml, and Other: Site benign, dressing applied.      Patient Education  Learner: Patient  Barriers to learning: No Barriers  Readiness to learn: Acceptance  Method: Explanation    Treatment Education:       Signs and symptoms of infection, bleeding, adverse reaction(s), symptom control, and when to notify MD.    Patient instructed on post-treatment care and precautions specific to drug(s) administered.   If applicable, education given on vesicant administration risk and signs and symptoms of extravasation (redness, pain, swelling, pressure at IV site) to report immediately to the chemotherapy RN.  Fall Prevention Education: Instructed patient to call for assistance, call light within reach.   Pain Education: Patient instructed to contact nurse if pain should develop or if their current pain therapy becomes ineffective.       Treatment education provided to patient: yes    Response: Verbalizes understanding    Discharge Plan  Discharge instructions given to patient.  Future appointments:date given and reviewed with treatment plan.  Discharge Mode: Ambulatory with cane  Discharge Time: [place] by: Self  Discharged To: Home     Medications   lidocaine (XYLOCAINE) 1% PF injection 0.3 mL (0.3 mL IntraDERMAL Given 03/03/23 1028)   sodium chloride 0.9 % flush 20 mL (20 mL IntraVENOUS Given 03/03/23 1300)   heparin (HEP-LOCK) flush injection 500 Units (500 Units IntraVENOUS Given 03/03/23 1300)   diphenhydrAMINE (BENADRYL) injection 25 mg (has no administration in time range)   hydrocortisone sodium succinate (SOLUCORTEF) injection 100 mg (has no administration in time range)   famotidine (PEPCID) injection 20 mg (has no administration in time range)   enfortumab vedotin-ejfv (PADCEV) 80 mg in sodium chloride 0.9 % 100 mL chemo infusion (0 mg IntraVENOUS  IV Stop 03/03/23 1143)   sodium chloride 0.9% infusion (0 mL IntraVENOUS Stopped 03/03/23 1300)   ondansetron (ZOFRAN) injection 8 mg (8 mg IntraVENOUS Given 03/03/23 1046)   pembrolizumab (KEYTRUDA) 200 mg in sodium chloride 0.9 % 100 mL infusion (0 mg IntraVENOUS Completed 03/03/23 1300)

## 2023-03-06 ENCOUNTER — Ambulatory Visit: Payer: Medicare Other | Attending: Urology | Admitting: Urology

## 2023-03-06 DIAGNOSIS — C642 Malignant neoplasm of left kidney, except renal pelvis: Secondary | ICD-10-CM | POA: Insufficient documentation

## 2023-03-06 MED ORDER — LIDOCAINE HCL 2% EX GEL (UROJET)
Status: AC
Start: 2023-03-06 — End: ?
  Filled 2023-03-06: qty 10

## 2023-03-06 MED ORDER — CIPROFLOXACIN HCL 500 MG OR TABS
500.0000 mg | ORAL_TABLET | Freq: Once | ORAL | Status: AC
Start: 2023-03-06 — End: 2023-03-06
  Administered 2023-03-06: 500 mg via ORAL

## 2023-03-06 MED ORDER — LIDOCAINE HCL 2% EX GEL (UROJET)
10.0000 mL | Freq: Once | Status: AC
Start: 2023-03-06 — End: 2023-03-06
  Administered 2023-03-06: 10 mL via URETHRAL

## 2023-03-06 MED ORDER — CIPROFLOXACIN HCL 500 MG OR TABS
ORAL_TABLET | ORAL | Status: AC
Start: 2023-03-06 — End: ?
  Filled 2023-03-06: qty 1

## 2023-03-06 NOTE — Interdisciplinary (Signed)
Two patient identifiers completed, Stephen [name] [birth year]  Rooming, vital signs, intake completed      Procedure to be Performed: Cystoscopy     Time in Room: 1410    Pt reports no nausea, vomiting, constipation, or diarrhea.   Procedure explained to patient and agreed.     Patient in supine position      Prepped with Betadine, sterile technique applied  Urojet Lidocaine was inserted into Urethra per facility protocol      Time out Called: 1501     A time out was conducted immediately prior to the procedure in agreement with the patient and team, confirming the following: patient name, MRN, date of birth, consent, procedure, site, side, marking, and correct equipment/implant, if applicable.           Start Procedure: 1501  End Procedure: [id number]  PVR:     Patient out of room:      Administrations This Visit       ciprofloxacin (CIPRO) tablet 500 mg       Admin Date  03/06/2023 Action  Given Dose  500 mg Route  Oral Administered By  Ashley Egegik, LVN              lidocaine (UROJET) 2 % topical jelly 10 mL       Admin Date  03/06/2023 Action  Given Dose  10 mL Route  Urethral Administered By  Ashley Dasher, LVN

## 2023-03-06 NOTE — Patient Instructions (Signed)
Patient Instructions - Cystoscopy After Care     Post-Procedure Information:For approximately 8-12 hours following the cystoscopy, you might have to urinate more frequently than usual and you may experience burning during urination. These minor discomforts should go away within 12-24 hours. If they continue, contact your doctor.      Your urine may be slightly red following your cystoscopy, this is normal. However, if your urine is bright red or if you are passing clots, call the Urology Clinic. If you are unable to urinate, try sitting in a tub of warm water to help you relax. Urinate into the bath water.       If you are still unable to urinate, call the Urology Clinic and ask to speak to the Urology Nurse.      If you develop a fever and/or chills, you may have an infection. Call your doctor immediately to see if you  need a prescription for antibiotics. Do not use an old prescription.      If you break out in a rash, you may be having an allergic reaction to a drug. Call your doctor immediately. Discontinue taking your medication until you have been advised by your doctor.     Severe pain in the lower abdomen or low back may be an indication of bladder spasms or an infection. Contact the doctor if you experience continued discomfort.     Call the clinic and ask to speak to the nurse if you experience any of the following:  · bright red bleeding or blood clots in your urine  · burning or frequent urination after 24 hours   · inability to urinate  · fever and/or chills  · severe pain in lower abdomen or low back  · you break out in a rash      If you have any questions and it is after business hours of 8am to 4:30pm, please call the page operator at # [phone] and ask to speak to the provider on-call.

## 2023-03-06 NOTE — Progress Notes (Signed)
CC:  I am here for cystoscopy      Interval History: Stephen [name] is a 72 year old male with a past medical history of solitary kidney, transurethral resection of the prostate recurrent bladder neck contracture, and recently diagnosed left sided filling defect on evaluation for hematuria.  CT urography on 04/22/2021 confirmed a filling defect in the anterior left renal pelvis.  Chest CT has been ordered but not performed yet.  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  He tolerated the procedure well.      He received 6 instillations of gelatinous mitomycin between August 05, 2021 and September 09, 2021.    MR urogram from October 26, 2021 with mild thickening in the left renal pelvis otherwise unremarkable.    Baseline creatinine has remained pre stable at 1.32    Notably, [facility] bladder cancer on 08-19-2021; repeat on 08-26-2021 no cancer.    He received ureteroscopy and transurethral resection of bladder tumor on December 24, 2021 which was notable for small volume low-grade appearing tumors in the renal pelvis as well as a tumor in the mid ureter.  Final pathology from the bladder was notable for high-grade urothelial dysplasia.    Ureteroscopy and transurethral resection of bladder tumor on 06-01-2022 with no evidence of disease on pathology.  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    MR urogram on October 22, 2022 with Multiple osseous lesions involving the bilateral iliac crest and right acetabulum concerning for osseous metastatic disease.     PET/CT with diffuse osseous FDG activity with discrete osseous lesions.   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  He was started on palliative radiation XRT and chemotherapy that is managed by Dr. Nicole Kindred.      He has undergone left PCN exchange 11/01/22 and 02/13/23.  He does not void on his own.    He was scheduled for cystoscopy with Dr. Sallyanne Kuster, but reports that he had to reschedule his appointment.  Presents today for cystoscopy.      Review of Systems: No TIA's or unusual headaches, no dysphagia.  No prolonged cough. No dyspnea or chest pain on exertion.  No abdominal pain, change in bowel habits, black or bloody stools.  No urinary tract or BPH symptoms.  No new or unusual musculoskeletal symptoms.      Physical Exam:   03/06/23  1403   BP: 109/64   Pulse: 70   Temp: 97.5 F (36.4 C)   Resp: 18   SpO2: 97%      Patient in no acute distress  Alert and Oriented  Normocephalic, atraumatic  Abdomen soft, non tender  No lower extremity edema    PROCEDURE: A time out was conducted immediately prior to the procedure in agreement with the patient and team, confirming the following: patient name, MRN, date of birth, consent, procedure, site, side, marking, and correct equipment/implant, if applicable.    The patient was given the risks and benefits of cystoscopy.  He was given antibiotics. He was prepped and draped in standard surgical fashion. Flexible cystoscopy was performed with a 16-French flexible cystoscope. His anterior urethra appeared  normal. His prostatic urethra was enlarged. The bladder was investigated.. Prior surgical site appeared normal without any evidence of mucosal abnormalities. There is no evidence of erythema, patchy areas, or papillary lesions. Complete inspection, including retroflexion, was performed, and barbotage cytology was obtained.      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       Assessment and plan:  72 year old male with newly diagnosed left upper tract urothelial  carcinoma with mixed high-grade and low-grade components in the context of solitary kidney.    Cystoscopy today without lesion; urine cytology was sent  Continue follow-up/chemotherapy with medical oncology  Recommended repeat cystoscopy with Dr. Sallyanne Kuster in 3 months    Theresia Bough, PA-C    [facility]  Department of Urology  [address], [place], [place] [address]  [phone]    Attending Urologist:  Dr Sallyanne Kuster, MD

## 2023-03-07 NOTE — Addendum Note (Signed)
Encounter addended by: Molly Maduro on: 03/07/2023 11:28 AM   Actions taken: Flowsheet accepted, Charge Capture section accepted

## 2023-03-08 NOTE — Addendum Note (Signed)
Encounter addended by: Molly Maduro on: 03/08/2023 12:30 PM   Actions taken: Flowsheet accepted, Charge Capture section accepted

## 2023-03-11 ENCOUNTER — Ambulatory Visit
Admission: RE | Admit: 2023-03-11 | Discharge: 2023-03-11 | Disposition: A | Discharge status: Home / Self Care | Payer: Medicare Other

## 2023-03-11 ENCOUNTER — Ambulatory Visit (HOSPITAL_BASED_OUTPATIENT_CLINIC_OR_DEPARTMENT_OTHER): Admit: 2023-03-11 | Discharge: 2023-03-11 | Disposition: A | Discharge status: Home Health | Payer: Medicare Other

## 2023-03-11 DIAGNOSIS — Z5189 Encounter for other specified aftercare: Secondary | ICD-10-CM

## 2023-03-11 MED ORDER — LIDOCAINE HCL (PF) 1 % IJ SOLN
0.3000 mL | INTRAMUSCULAR | Status: DC | PRN
Start: 2023-03-11 — End: 2023-03-12

## 2023-03-11 MED ORDER — HEPARIN SODIUM LOCK FLUSH 100 UNIT/ML IJ SOLN CUSTOM
500.0000 [IU] | INTRAVENOUS | Status: DC | PRN
Start: 2023-03-11 — End: 2023-03-12

## 2023-03-11 MED ORDER — SODIUM CHLORIDE 0.9 % IJ SOLN (CUSTOM)
20.0000 mL | INTRAMUSCULAR | Status: DC | PRN
Start: 2023-03-11 — End: 2023-03-12

## 2023-03-13 ENCOUNTER — Encounter (HOSPITAL_BASED_OUTPATIENT_CLINIC_OR_DEPARTMENT_OTHER): Payer: Self-pay

## 2023-03-13 ENCOUNTER — Telehealth (HOSPITAL_BASED_OUTPATIENT_CLINIC_OR_DEPARTMENT_OTHER): Payer: Self-pay

## 2023-03-13 NOTE — Telephone Encounter (Signed)
[place] / Medical Oncology Social Work Telephone note:    Stephen [name] is well known to this LCSW and Dr. Les Pou team for treatment of bladder cancer.  Stephen [name] periodically checks in with social work for ongoing support.  He is an effective self advocate and asks good questions.    Today, I received a message from Stephen Gave, RN, that Stephen [name] missed his infusion on Saturday, 3/23.  This is unusual for Stephen [name] who has excellent adherence to the treatment plan, readily communicates with staff, and uses MyChart effectively.  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    I left a message for Stephen [name] asking him to return my call.    Will update Danielle, RN, once Stephen [name] calls back.    Update 3/26:  Stephen [name] returned my call at 8 pm last night.  Stephen [name] he was in an outside ED, waiting on testing and labs.      Today, chart shows that he was admitted to [facility] for concerns of encephalopathy.  But he is stable.  Both Dr. Nicole [name] and Stephen Poles RN aware.    Tried to call Stephen [name] back today but his phone was not accepting calls.  (He did mention in his voicemail that his phone charge was low.)      Stephen [name], [place] Social Worker  [phone]

## 2023-03-17 ENCOUNTER — Telehealth (HOSPITAL_BASED_OUTPATIENT_CLINIC_OR_DEPARTMENT_OTHER): Payer: Self-pay

## 2023-03-17 NOTE — Telephone Encounter (Signed)
[place] / Medical Oncology Social Work Telephone note:    Follow up call with Stephen [name]; we have been leaving each other messages for the past couple days.  Stephen [name] said he's feeling better.  Today, he is able to take care of household tasks like laundry and cleaning etc.  He continues to be well supported by his sister.    Stephen [name] had been in [facility] ED.  Not happy with the care there.  Encouraged him to come to [facility] ED for future since he's in our system.  I did inform him all ED's have long waits, however.      Stephen [name] said his phone was shut off by [employer] (b/c of payment lapse) while he was in the ED now got it reinstated.  He may switch to his sister's [employer] plan.  Stephen [name] knows how important it is to have a relaible phone in order to manage his cancer treatment and other health issues.    [name]'s fast track, appt with Dr. Nicole Kindred, and Infusion are all scheduled for 4/4.      Of note, [name]'s brother-in-law (with whom he lived) passed away two weeks ago.  Stephen [name] will continue to live with his sister (main support) and niece.    Stephen [name] is also followed by Education officer, museum from [employer].  He has a good rapport with her.    Updated [name], RN.    [name], [place] Social Worker  [phone]

## 2023-03-20 ENCOUNTER — Telehealth (HOSPITAL_BASED_OUTPATIENT_CLINIC_OR_DEPARTMENT_OTHER): Payer: Self-pay | Admitting: Hematology & Oncology

## 2023-03-20 ENCOUNTER — Telehealth (HOSPITAL_BASED_OUTPATIENT_CLINIC_OR_DEPARTMENT_OTHER): Payer: Self-pay

## 2023-03-20 DIAGNOSIS — C662 Malignant neoplasm of left ureter: Secondary | ICD-10-CM

## 2023-03-20 NOTE — Telephone Encounter (Signed)
Caller: patient   Relationship to patient: self  Phone # [phone]  Provider: Dr. Nicole Kindred  Notes:     Patient is calling in regards to see if he can have a nurse visit in regards to his tube . Patient states he is leaking urine on him and does not want to wait till Thursday for the appointment because than he is leaking urine for 3 to 4 days and would not like that please assist     Caller has been advised of 24-72 hr turnaround time.

## 2023-03-20 NOTE — Telephone Encounter (Signed)
Records reviewed. Pt seen at [facility] ER on 3/25. CT scan showed PCN tube was dislodged, tube replaced on 3/26.   Treated empirically for UTI, Ceftin 500mg  twice daily x 7 days.    Return call placed to pt to discuss symptoms.   No answer. LVM requesting return call.     Call center - if pt calls back, please secure chat this Probation officer.     Pt with urothelial carcinoma. Treated with EV+pembro. Pt with L PCN tube.

## 2023-03-20 NOTE — Telephone Encounter (Signed)
2nd call placed to pt to assess symptoms.   No answer.

## 2023-03-20 NOTE — Telephone Encounter (Signed)
Dr. Nicole Kindred,     Lovena Le is scheduled for infusion of Padcev on 4/4.     Please review the treatment plan and change the date to reflect patient's scheduled appointment on 4/4.    Patient has a scheduled clinic visit on 4/4.    Thank you,   Hardie Shackleton, RN  RN Liaison

## 2023-03-21 ENCOUNTER — Inpatient Hospital Stay
Admission: EM | Admit: 2023-03-21 | Discharge: 2023-03-24 | DRG: 947 | Disposition: A | Discharge status: Home Health | Payer: Medicare Other | Attending: Hospitalist | Admitting: Hospitalist

## 2023-03-21 ENCOUNTER — Emergency Department (HOSPITAL_COMMUNITY): Payer: Medicare Other

## 2023-03-21 ENCOUNTER — Other Ambulatory Visit (HOSPITAL_COMMUNITY): Payer: Self-pay

## 2023-03-21 DIAGNOSIS — R1310 Dysphagia, unspecified: Secondary | ICD-10-CM

## 2023-03-21 DIAGNOSIS — C799 Secondary malignant neoplasm of unspecified site: Secondary | ICD-10-CM

## 2023-03-21 DIAGNOSIS — C642 Malignant neoplasm of left kidney, except renal pelvis: Secondary | ICD-10-CM

## 2023-03-21 DIAGNOSIS — M109 Gout, unspecified: Secondary | ICD-10-CM | POA: Diagnosis present

## 2023-03-21 DIAGNOSIS — D630 Anemia in neoplastic disease: Secondary | ICD-10-CM | POA: Diagnosis present

## 2023-03-21 DIAGNOSIS — Z79891 Long term (current) use of opiate analgesic: Secondary | ICD-10-CM

## 2023-03-21 DIAGNOSIS — K5900 Constipation, unspecified: Secondary | ICD-10-CM | POA: Diagnosis present

## 2023-03-21 DIAGNOSIS — E43 Unspecified severe protein-calorie malnutrition: Secondary | ICD-10-CM | POA: Diagnosis present

## 2023-03-21 DIAGNOSIS — G8929 Other chronic pain: Secondary | ICD-10-CM

## 2023-03-21 DIAGNOSIS — C7951 Secondary malignant neoplasm of bone: Secondary | ICD-10-CM | POA: Diagnosis present

## 2023-03-21 DIAGNOSIS — M130 Polyarthritis, unspecified: Secondary | ICD-10-CM | POA: Diagnosis present

## 2023-03-21 DIAGNOSIS — I129 Hypertensive chronic kidney disease with stage 1 through stage 4 chronic kidney disease, or unspecified chronic kidney disease: Secondary | ICD-10-CM | POA: Diagnosis present

## 2023-03-21 DIAGNOSIS — G4700 Insomnia, unspecified: Secondary | ICD-10-CM | POA: Diagnosis present

## 2023-03-21 DIAGNOSIS — Z559 Problems related to education and literacy, unspecified: Secondary | ICD-10-CM

## 2023-03-21 DIAGNOSIS — R531 Weakness: Secondary | ICD-10-CM

## 2023-03-21 DIAGNOSIS — Z905 Acquired absence of kidney: Secondary | ICD-10-CM

## 2023-03-21 DIAGNOSIS — Z7409 Other reduced mobility: Principal | ICD-10-CM

## 2023-03-21 DIAGNOSIS — M479 Spondylosis, unspecified: Secondary | ICD-10-CM | POA: Diagnosis present

## 2023-03-21 DIAGNOSIS — R5383 Other fatigue: Secondary | ICD-10-CM

## 2023-03-21 DIAGNOSIS — Z9079 Acquired absence of other genital organ(s): Secondary | ICD-10-CM

## 2023-03-21 DIAGNOSIS — Z79899 Other long term (current) drug therapy: Secondary | ICD-10-CM

## 2023-03-21 DIAGNOSIS — R112 Nausea with vomiting, unspecified: Secondary | ICD-10-CM | POA: Diagnosis present

## 2023-03-21 DIAGNOSIS — Z6824 Body mass index (BMI) 24.0-24.9, adult: Secondary | ICD-10-CM

## 2023-03-21 DIAGNOSIS — C678 Malignant neoplasm of overlapping sites of bladder: Secondary | ICD-10-CM

## 2023-03-21 DIAGNOSIS — R627 Adult failure to thrive: Secondary | ICD-10-CM

## 2023-03-21 DIAGNOSIS — K9049 Malabsorption due to intolerance, not elsewhere classified: Secondary | ICD-10-CM

## 2023-03-21 DIAGNOSIS — Z9221 Personal history of antineoplastic chemotherapy: Secondary | ICD-10-CM

## 2023-03-21 DIAGNOSIS — Z789 Other specified health status: Secondary | ICD-10-CM

## 2023-03-21 DIAGNOSIS — Z882 Allergy status to sulfonamides status: Secondary | ICD-10-CM

## 2023-03-21 DIAGNOSIS — Z87442 Personal history of urinary calculi: Secondary | ICD-10-CM

## 2023-03-21 DIAGNOSIS — M25561 Pain in right knee: Secondary | ICD-10-CM | POA: Diagnosis present

## 2023-03-21 DIAGNOSIS — Z936 Other artificial openings of urinary tract status: Secondary | ICD-10-CM

## 2023-03-21 DIAGNOSIS — Z923 Personal history of irradiation: Secondary | ICD-10-CM

## 2023-03-21 DIAGNOSIS — N189 Chronic kidney disease, unspecified: Secondary | ICD-10-CM | POA: Diagnosis present

## 2023-03-21 DIAGNOSIS — G893 Neoplasm related pain (acute) (chronic): Principal | ICD-10-CM | POA: Diagnosis present

## 2023-03-21 LAB — CBC WITH DIFF, BLOOD
ANC-Automated: 4 10*3/uL (ref 1.6–7.0)
Abs Basophils: 0 10*3/uL (ref <0.2)
Abs Eosinophils: 0.1 10*3/uL (ref 0.0–0.5)
Abs Lymphs: 0.9 10*3/uL (ref 0.8–3.1)
Abs Monos: 0.6 10*3/uL (ref 0.2–0.8)
Basophils: 1 %
Eosinophils: 1 %
Hct: 30.1 % — ABNORMAL LOW (ref 40.0–50.0)
Hgb: 10 gm/dL — ABNORMAL LOW (ref 13.7–17.5)
Imm Gran %: 1 % — ABNORMAL HIGH (ref <1)
Lymphocytes: 16 %
MCH: 27.7 pg (ref 26.0–32.0)
MCHC: 33.2 g/dL (ref 32.0–36.0)
MCV: 83.4 um3 (ref 79.0–95.0)
MPV: 10 fL (ref 9.4–12.4)
Monocytes: 11 %
Plt Count: 267 10*3/uL (ref 140–370)
RBC: 3.61 10*6/uL — ABNORMAL LOW (ref 4.60–6.10)
RDW: 16.5 % — ABNORMAL HIGH (ref 12.0–14.0)
Segs: 70 %
WBC: 5.7 10*3/uL (ref 4.0–10.0)

## 2023-03-21 LAB — URINALYSIS WITH CULTURE REFLEX, WHEN INDICATED
Bilirubin: NEGATIVE
Glucose: NEGATIVE
Ketones: NEGATIVE
Leuk Esterase: 250 Leu/uL — AB
Nitrite: NEGATIVE
Specific Gravity: 1.019 (ref 1.002–1.030)
Urobilinogen: NEGATIVE
pH: 5.5 (ref 5.0–8.0)

## 2023-03-21 LAB — COMPREHENSIVE METABOLIC PANEL, BLOOD
ALT (SGPT): 11 U/L (ref 0–41)
AST (SGOT): 22 U/L (ref 0–40)
Albumin: 3.7 g/dL (ref 3.5–5.2)
Alkaline Phos: 115 U/L (ref 40–129)
Anion Gap: 11 mmol/L (ref 7–15)
BUN: 26 mg/dL — ABNORMAL HIGH (ref 8–23)
Bicarbonate: 29 mmol/L (ref 22–29)
Bilirubin, Tot: 0.57 mg/dL (ref <1.2)
Calcium: 9.7 mg/dL (ref 8.5–10.6)
Chloride: 98 mmol/L (ref 98–107)
Creatinine: 0.99 mg/dL (ref 0.67–1.17)
Glucose: 117 mg/dL — ABNORMAL HIGH (ref 70–99)
Potassium: 4.3 mmol/L (ref 3.5–5.1)
Sodium: 138 mmol/L (ref 136–145)
Total Protein: 6.9 g/dL (ref 6.0–8.0)
eGFR Based on CKD-EPI 2021 Equation: >60 mL/min/{1.73_m2}

## 2023-03-21 LAB — LACTATE, BLOOD: Lactate: 0.9 mmol/L (ref 0.5–2.0)

## 2023-03-21 LAB — LIPASE, BLOOD: Lipase: 18 U/L (ref 13–60)

## 2023-03-21 MED ORDER — POLYETHYLENE GLYCOL 3350 OR PACK
17.0000 g | PACK | Freq: Every day | ORAL | Status: DC
Start: 2023-03-21 — End: 2023-03-22
  Administered 2023-03-22: 17 g via ORAL
  Filled 2023-03-21 (×2): qty 1

## 2023-03-21 MED ORDER — ALLOPURINOL 100 MG OR TABS
100.0000 mg | ORAL_TABLET | Freq: Every day | ORAL | Status: DC
Start: 2023-03-21 — End: 2023-03-24
  Administered 2023-03-21 – 2023-03-24 (×4): 100 mg via ORAL
  Filled 2023-03-21 (×4): qty 1

## 2023-03-21 MED ORDER — OXYCODONE HCL 10 MG OR TABS
10.0000 mg | ORAL_TABLET | Freq: Once | ORAL | Status: AC
Start: 2023-03-21 — End: 2023-03-21
  Administered 2023-03-21: 10 mg via ORAL
  Filled 2023-03-21: qty 1

## 2023-03-21 MED ORDER — PROCHLORPERAZINE MALEATE 10 MG OR TABS
5.0000 mg | ORAL_TABLET | Freq: Four times a day (QID) | ORAL | Status: DC | PRN
Start: 2023-03-21 — End: 2023-03-24

## 2023-03-21 MED ORDER — ENOXAPARIN SODIUM 40 MG/0.4ML IJ SOSY
40.0000 mg | PREFILLED_SYRINGE | Freq: Every day | INTRAMUSCULAR | Status: DC
Start: 2023-03-21 — End: 2023-03-21

## 2023-03-21 MED ORDER — NALOXONE HCL 0.4 MG/ML IJ SOLN
0.1000 mg | INTRAMUSCULAR | Status: DC | PRN
Start: 2023-03-21 — End: 2023-03-24

## 2023-03-21 MED ORDER — SODIUM CHLORIDE 0.9% TKO INFUSION
INTRAVENOUS | Status: DC | PRN
Start: 2023-03-21 — End: 2023-03-24

## 2023-03-21 MED ORDER — OXYCODONE HCL 5 MG OR TABS
5.0000 mg | ORAL_TABLET | ORAL | Status: DC | PRN
Start: 2023-03-21 — End: 2023-03-24
  Administered 2023-03-21 – 2023-03-24 (×9): 5 mg via ORAL
  Filled 2023-03-21 (×9): qty 1

## 2023-03-21 MED ORDER — ONDANSETRON 4 MG OR TBDP
4.0000 mg | ORAL_TABLET | Freq: Three times a day (TID) | ORAL | Status: DC | PRN
Start: 2023-03-21 — End: 2023-03-24
  Administered 2023-03-21 – 2023-03-22 (×2): 4 mg via ORAL
  Filled 2023-03-21 (×2): qty 1

## 2023-03-21 MED ORDER — PROCHLORPERAZINE EDISYLATE 5 MG/ML IJ SOLN WRAPPED RECORD
5.0000 mg | Freq: Four times a day (QID) | INTRAMUSCULAR | Status: DC | PRN
Start: 2023-03-21 — End: 2023-03-24
  Administered 2023-03-21 – 2023-03-23 (×3): 5 mg via INTRAVENOUS
  Filled 2023-03-21 (×3): qty 2

## 2023-03-21 MED ORDER — TAMSULOSIN HCL 0.4 MG PO CAPS
0.4000 mg | ORAL_CAPSULE | Freq: Every day | ORAL | Status: DC
Start: 2023-03-21 — End: 2023-03-24
  Administered 2023-03-21 – 2023-03-24 (×4): 0.4 mg via ORAL
  Filled 2023-03-21 (×4): qty 1

## 2023-03-21 MED ORDER — ACETAMINOPHEN 325 MG PO TABS
1000.0000 mg | ORAL_TABLET | Freq: Three times a day (TID) | ORAL | Status: DC | PRN
Start: 2023-03-21 — End: 2023-03-21

## 2023-03-21 MED ORDER — THERA M PLUS OR TABS
1.0000 | ORAL_TABLET | Freq: Every day | ORAL | Status: DC
Start: 2023-03-21 — End: 2023-03-24
  Administered 2023-03-21 – 2023-03-24 (×4): 1 via ORAL
  Filled 2023-03-21 (×4): qty 1

## 2023-03-21 MED ORDER — ACETAMINOPHEN 325 MG PO TABS
1000.0000 mg | ORAL_TABLET | Freq: Three times a day (TID) | ORAL | Status: DC | PRN
Start: 2023-03-21 — End: 2023-03-24

## 2023-03-21 MED ORDER — SODIUM CHLORIDE 0.9 % IV SOLN
INTRAVENOUS | Status: AC
Start: 2023-03-21 — End: 2023-03-22

## 2023-03-21 MED ORDER — TRAZODONE HCL 50 MG OR TABS
50.0000 mg | ORAL_TABLET | Freq: Every evening | ORAL | Status: DC
Start: 2023-03-21 — End: 2023-03-24
  Administered 2023-03-21 – 2023-03-23 (×3): 50 mg via ORAL
  Filled 2023-03-21 (×3): qty 1

## 2023-03-21 MED ORDER — HEPARIN SODIUM (PORCINE) 5000 UNIT/ML IJ SOLN
5000.0000 [IU] | Freq: Three times a day (TID) | INTRAMUSCULAR | Status: DC
Start: 2023-03-21 — End: 2023-03-24
  Administered 2023-03-21 – 2023-03-24 (×10): 5000 [IU] via SUBCUTANEOUS
  Filled 2023-03-21 (×10): qty 1

## 2023-03-21 MED ORDER — ONDANSETRON HCL 4 MG/2ML IV SOLN
4.0000 mg | Freq: Three times a day (TID) | INTRAMUSCULAR | Status: DC | PRN
Start: 2023-03-21 — End: 2023-03-24
  Administered 2023-03-23: 4 mg via INTRAVENOUS
  Filled 2023-03-21 (×2): qty 2

## 2023-03-21 MED ORDER — SENNA 8.6 MG OR TABS
2.0000 | ORAL_TABLET | Freq: Two times a day (BID) | ORAL | Status: DC
Start: 2023-03-21 — End: 2023-03-24
  Administered 2023-03-21 – 2023-03-24 (×6): 17.2 mg via ORAL
  Filled 2023-03-21 (×6): qty 2

## 2023-03-21 MED ORDER — LIDOCAINE 4 % EX PTCH
1.0000 | MEDICATED_PATCH | CUTANEOUS | Status: DC
Start: 2023-03-21 — End: 2023-03-24
  Administered 2023-03-21 – 2023-03-24 (×4): 1 via TRANSDERMAL
  Filled 2023-03-21 (×4): qty 1

## 2023-03-21 MED ORDER — PREGABALIN 100 MG OR CAPS
100.0000 mg | ORAL_CAPSULE | Freq: Every evening | ORAL | Status: DC
Start: 2023-03-21 — End: 2023-03-24
  Administered 2023-03-21 – 2023-03-23 (×3): 100 mg via ORAL
  Filled 2023-03-21 (×3): qty 1

## 2023-03-21 MED ORDER — SODIUM CHLORIDE 0.9 % IJ SOLN (CUSTOM)
3.0000 mL | INTRAMUSCULAR | Status: DC | PRN
Start: 2023-03-21 — End: 2023-03-24

## 2023-03-21 MED ORDER — SODIUM CHLORIDE 0.9 % IJ SOLN (CUSTOM)
3.0000 mL | Freq: Three times a day (TID) | INTRAMUSCULAR | Status: DC
Start: 2023-03-21 — End: 2023-03-24
  Administered 2023-03-21 – 2023-03-24 (×9): 3 mL via INTRAVENOUS

## 2023-03-21 NOTE — ED Notes (Signed)
Admission provider at bedside

## 2023-03-21 NOTE — H&P (Signed)
Hospital Medicine Admission    Attending MD:   Linna Caprice*    Chief Complaint:  "The other hospital screwed me up"    History of Present Illness:     Stephen [name] is a 72 year old male with a history of metastatic urothelial carcinoma (bone) s/p L nephrostomy and ureteral stent placement s/p chemoradiation, most recently on EV/pembrolizumab (last given 03/03/23), CKD, gout, BPH s/p TURP, insomnia, and recent admission to [facility] (3/25-3/26) for a dislodged nephrostomy tube who presented w/ a constellation of symptoms including nausea, poor PO intake, weight loss, constipation, and generalized weakness.     Reports he is staying with his sister who helps care for him but she is gone for the next few days at a funeral for her husband who recently passed away. He was recently admitted to [facility] from 3/25-3/26 and feels like he is declining overall. He reports losing 40 lb in about 1 month stating, "I just can't eat". He does have some nausea without vomiting but his appetite is very poor even outside of the nausea. He reports some upper/epigastric abdominal pain that is worsening recently and becoming more constant which he attributes to his stomach being empty. It is a dull pain, worse with palpation, and currently a 5/10. Not sure whether it is worse with eating/drinking because he doesn't eat/drink much. Since leaving the hospital he has been very constipated as well though did have a small hard black bowel movement a few days ago. Denies hematochezia. 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 He has been lightheaded more frequently lately but denies syncope though has had a couple "close calls". Overall, he feels extremely weak though it is not focal. Has not been able to walk much due to R hip/R knee pain though this is not new. In fact none of his symptoms are new, they have just been persistent and he does not feel he can manage them well at home without  support. Describes a few days of nasal congestion as well. Denies fever/chills. Lastly, he reports swelling in his legs and intermittent chest pain with slightly increased SOB. Chest pain is described as tightness. It is not present currently and the last time he felt it was a week or two ago. Typically occurs with deep breathing. He cannot lay flat due to back pain.    Past Medical History:  Past Medical History:   Diagnosis Date    Chronic back pain     Congenital hydronephrosis     Gout     Headache     Hematuria     HTN (hypertension) 12/10/2021    Kidney disease     Kidney stones     Major depressive disorder, single episode     Polyarthropathy or polyarthritis of multiple sites     Retinal detachment     Urethral stricture        Past Surgical History:  Past Surgical History:   Procedure Laterality Date    CT INSERTION OF SUPRAPUBIC CATH  09/25/2015    NEPHRECTOMY Right 1955    APPENDECTOMY      COLONOSCOPY      CYSTOSCOPY      CYSTOSCOPY W/ LASER LITHOTRIPSY      OTHER SURGICAL HISTORY      Interstim 01/29/2011    SPINE SURGERY  09/21    Lumbar-sacral fusion    TRANSURETHRAL RESECTION OF PROSTATE  Allergies:  Allergies   Allergen Reactions    Sulfa Drugs Unspecified       Medications:  Prior to Admission Medications   Prescriptions Last Dose Informant Patient Reported? Taking?   DULoxetine (CYMBALTA) 20 MG CR capsule   No No   Sig: Take 1 capsule (20 mg) by mouth daily.   Multiple Vitamins-Minerals (MULTIVITAMIN WITH MINERALS) TABS tablet   No No   Sig: Take 1 tablet by mouth daily.   Nutritional Supplements (NEPRO) LIQD   No No   Sig: Take 3 Cans by mouth daily.   acetaminophen (TYLENOL) 500 MG tablet   No No   Sig: Take 2 tablets (1,000 mg) by mouth every 8 hours as needed for Mild Pain (Pain Score 1-3) or Moderate Pain (Pain Score 4-6).   allopurinol (ZYLOPRIM) 100 MG tablet   No No   Sig: Take 1 tablet (100 mg) by mouth daily.   docusate sodium (COLACE) 100 MG capsule   No No   Sig: Take 1 capsule  (100 mg) by mouth 2 times daily as needed for Constipation.   ketotifen (ALAWAY) 0.025 % ophthalmic solution   Yes No   Sig: Place 1 drop into both eyes 2 times daily.   naloxone (NARCAN) 4 mg/0.1 mL nasal spray   No No   Sig: For suspected opioid overdose, call 911! Then spray once in one nostril. Repeat after 3 minutes if no or minimal response using a new spray in other nostril.   nystatin (MYCOSTATIN) 100,000 units/mL suspension   No No   Sig: Take 5 mL (500,000 Units) by mouth 4 times daily.   oxyCODONE (ROXICODONE) 10 MG tablet   No No   Sig: Take 1.5 tablets to 2 tablets (15mg  to 20mg ) by mouth every 4 hours as needed for pain   polyethylene glycol (MIRALAX) 17 g packet   Yes No   Sig: Take 1 packet (17 g) by mouth daily as needed for Constipation. IN 120 - 240 ML OF FLUID   pregabalin (LYRICA) 100 MG capsule   No No   Sig: Take 1 capsule (100 mg) by mouth at bedtime.   prochlorperazine (COMPAZINE) 10 MG tablet   No No   Sig: Take 1 tablet (10 mg) by mouth every 6 hours as needed (Nausea/Vomiting).   senna (SENOKOT) 8.6 MG tablet   No No   Sig: Take 2 tablets (17.2 mg) by mouth daily.   tamsulosin (FLOMAX) 0.4 MG capsule   No No   Sig: TAKE 1 CAPSULE BY MOUTH EVERY DAY   traZODone (DESYREL) 50 MG tablet   No No   Sig: Take 0.5 tablets (25 mg) by mouth at bedtime.   triamcinolone (KENALOG) 0.1 % cream   Yes No   Sig: Apply 1 Application. topically 2 times daily. Apply a thin layer as directed      Facility-Administered Medications: None        Social History:  Social History     Socioeconomic History    Marital status: Single   Tobacco Use    Smoking status: Never     Passive exposure: Never    Smokeless tobacco: Never   Vaping Use    Vaping Use: Never used   Substance and Sexual Activity    Alcohol use: No    Drug use: Not Currently    Sexual activity: Not Currently     Partners: Female   Social Activities of Daily Living Programmer, multimedia  No    Blood Transfusions Yes    Caffeine Concern No     Occupational Exposure No    Hobby Hazards No    Sleep Concern Yes     Comment: due to my lower back discomfort and leg discomfort    Stress Concern Yes     Comment: Health/ housing/ financial    Weight Concern No    Special Diet No    Back Care Yes     Comment: Am very careful with any activity    Exercises Regularly Yes    Seat Belt Use Yes    Performs Self-Exams Yes    Bike Helmet Use No       Family History:  Family History   Adopted: Yes   Family history unknown: Yes       ROS 10 point:  Review of Systems   Constitutional:  Positive for appetite change, fatigue and unexpected weight change. Negative for chills and fever.   HENT:  Positive for congestion and rhinorrhea (chronic). Negative for sore throat.    Respiratory:  Positive for shortness of breath. Negative for cough, wheezing and stridor.    Cardiovascular:  Positive for chest pain and leg swelling.   Gastrointestinal:  Positive for abdominal pain, constipation and nausea. Negative for blood in stool, diarrhea and vomiting.   Genitourinary:  Positive for difficulty urinating (Has PCN). Negative for hematuria.   Musculoskeletal:  Positive for arthralgias, back pain and myalgias.   Skin:  Negative for rash and wound.   Neurological:  Positive for dizziness, weakness, light-headedness and numbness. Negative for seizures and syncope.   Psychiatric/Behavioral:  Negative for confusion and hallucinations.         Physical Exam:  BP 104/61 (BP Location: Left arm, BP Patient Position: Sitting)   Pulse 55   Temp 97.7 F (36.5 C)   Resp 17   Ht 5\' 6"  (1.676 m)   Wt 69.1 kg (152 lb 5.4 oz)   SpO2 99%   BMI 24.59 kg/m   Physical Exam  Vitals reviewed.   Constitutional:       General: He is not in acute distress.     Appearance: He is not ill-appearing, toxic-appearing or diaphoretic.      Comments: Moderate to severe diffuse muscle wasting   HENT:      Head: Normocephalic and atraumatic.      Mouth/Throat:      Mouth: Mucous membranes are moist.   Eyes:       General: No scleral icterus.     Extraocular Movements: Extraocular movements intact.      Conjunctiva/sclera: Conjunctivae normal.   Cardiovascular:      Rate and Rhythm: Normal rate and regular rhythm.      Pulses: Normal pulses.      Heart sounds: No murmur heard.  Pulmonary:      Effort: Pulmonary effort is normal. No respiratory distress.      Breath sounds: Normal breath sounds. No stridor. No wheezing, rhonchi or rales.   Abdominal:      General: Bowel sounds are normal. There is no distension.      Palpations: Abdomen is soft.      Tenderness: There is abdominal tenderness (epigastric). There is no guarding or rebound.   Genitourinary:     Comments: Nephrostomy tube in place (L) draining clear yellow urine  Musculoskeletal:         General: Normal range of motion.      Cervical back:  Normal range of motion. No rigidity.      Right lower leg: No edema.      Left lower leg: No edema.   Skin:     General: Skin is warm and dry.      Coloration: Skin is not jaundiced.   Neurological:      General: No focal deficit present.      Mental Status: He is alert and oriented to person, place, and time.      Sensory: No sensory deficit.      Motor: No weakness.   Psychiatric:      Comments: Slightly anxious          Labs and Other Data:  Lab Results   Component Value Date    NA 138 03/21/2023    K 4.3 03/21/2023    CL 98 03/21/2023    BICARB 29 03/21/2023    BUN 26 (H) 03/21/2023    CREAT 0.99 03/21/2023    GLU 117 (H) 03/21/2023    [place] 9.7 03/21/2023     Lab Results   Component Value Date    WBC 5.7 03/21/2023    HGB 10.0 (L) 03/21/2023    HCT 30.1 (L) 03/21/2023    PLT 267 03/21/2023     Lab Results   Component Value Date    WBC 5.7 03/21/2023    HGB 10.0 (L) 03/21/2023    HCT 30.1 (L) 03/21/2023    PLT 267 03/21/2023    SEG 70 03/21/2023    LYMPHS 16 03/21/2023    MONOS 11 03/21/2023    EOS 1 03/21/2023     Lab Results   Component Value Date    AST 22 03/21/2023    ALT 11 03/21/2023    ALK 115 03/21/2023    TBILI 0.57  03/21/2023    TP 6.9 03/21/2023    ALB 3.7 03/21/2023     No results found for: "INR", "PTT"  No results found for: "ARTPH", "ARTPO2", "ARTPCO2"  No results found for: "PH", "PO2", "PCO2"  No results found for: "TSH", "FREET4", "T3"  No results found for: "CPK", "[place]", "TROPONIN"  No results found for: "CPK", "[place]", "TROPONIN"  Lab Results   Component Value Date    PHUA 5.5 03/21/2023    SGUA 1.019 03/21/2023    GLUCOSEUA Negative 03/21/2023    KETONEUA Negative 03/21/2023    BLOODUA 1+ (A) 03/21/2023    PROTEINUA 1+ (A) 03/21/2023    LEUKESTUA 250 (A) 03/21/2023    NITRITEUA Negative 03/21/2023    WBCUA 11-20 (A) 03/21/2023    RBCUA 11-20 (A) 03/21/2023       Micro:   Microbiology Results (last 30 days)       ** No results found for the last 720 hours. **            Imaging:   X-Ray Chest Single View   Final Result   IMPRESSION:   No acute findings to explain the provided history except for new port currently being accessed, and possible skeletal metastases worsened or new since last chest x-ray May 5th of last year          Assessment and Care Plan:  Stephen [name] is a 72 year old male with a history of metastatic urothelial carcinoma (bone) s/p L nephrostomy and ureteral stent placement s/p chemoradiation, most recently on EV/pembrolizumab (last given 03/03/23), CKD, gout, BPH s/p TURP, insomnia, and recent admission to [facility] (3/25-3/26) for a dislodged nephrostomy tube who  presented w/ a constellation of symptoms including nausea, poor PO intake, weight loss, constipation, and generalized weakness and has been admitted to failure to thrive.    #Severe protein calorie malnutrition  #Unintentional weight loss  #Poor PO intake  #Generalized weakness  #Nausea without vomiting  #Abdominal pain: Reports long-standing nausea without vomiting and epigastric abdominal pain worsening recently. Severely limiting PO intake and believes he has lost 40 lb. Has severe diffuse muscle wasting on exam and had mild  epigastric tenderness to palpation. Suspect this is all 2/2 cancer and treatment. Lipase and LFT's normal. No e/o acute infection or bleeding. Other considerations include gastritis, gastroparesis, PUD though these are less likely. Abdominal pain may also be at least partially related to constipation.   - Antiemetics   - Zofran first line   - Compazine second line  - Nutrition consulted, appreciate recommendations  - PT/OT  - IVF: NS 100 mL/hr x 12 hours    #Constipation: Likely 2/2 narcotics and poor PO intake.  - Aggressive bowel regimen    #L hip pain  #Chronic back pain: Back pain 2/2 OA and L hip pain related to cancer. Not taking any pain medications at home. Was previously seen by palliative care team on 2/5  - Tylenol 975 mg Q8H PRN  - Lidocaine patches PRN  - Consider re-consulting Palliative Care in AM    #Metastatic urothelial carcinoma s/p  #L nephrostomy and ureteral stent placement: See note from [place], [place] on 3/13 for details of care. Most recently started on EV/pembrolizumab, last received 03/03/23. Nephrostomy exchanged most recently 03/14/23. Undergoing XRT to right shoulder.   - Will notify Dr. Nicole Kindred of admission    #CKD: Kidney function at baseline.   - Avoid nephrotoxic agents  - Renally dose all medications    #Normocytic anemia: Hgb stable from prior without e/o acute blood loss. Hgb 10.  - Daily CBC    #Neuropathic pain: Continue home pregabalin 100 mg nightly  - Duloxetine discontinued on outpatient visit 3/11 per notes- patient unsure if taking    #Gout: Continue home allopurinol 100 mg daily    #BPH s/p TURP: Continue home tamsulosin 0.4 mg daily    #Diet: Regular diet  #Bowel regimen: Senna and miralax BID PRN  #VTE prophylaxis: Heparin  #Proxy decision maker: Payton Emerald [id number]  #Code status: Full code    Barth Kirks, [facility]  03/21/23, 1:20 PM

## 2023-03-21 NOTE — Telephone Encounter (Addendum)
Spoke to pt, reported that urine is draining less in the bag and leaking from PCN insertion site is more than what's draining, he is on his way to [facility] for tube eval, he called to update [name].    Stated that he can tolerate the pain but he can not tolerate waiting for 2 more days, he has been leaking for 5 days now.    Secure chat to RN Delia Heady / Cleta Alberts  but both off line

## 2023-03-21 NOTE — ED Provider Notes (Addendum)
ED Provider Note  [place] electronic medical record reviewed for pertinent medical history.     Stephen [name] DOB: 10-Apr-1951 PMD: Laurine Blazer     Chief Complaint   Patient presents with    Nausea     Nausea onset today, unable to eat x 2-3 weeks. Reports feeling unwell    Weakness     Last chemo 2-3 weeks ago, body feels so tired and weak, can't walk. Head hurts, body hurts    Urinary Catheter Problem     Reports left nephrostomy tube leaking       HPI: Stephen [name] is a 72 year old male who has a past medical history of Chronic back pain, Congenital hydronephrosis, Gout, Headache, Hematuria, HTN (hypertension) (12/10/2021), Kidney disease, Kidney stones, Major depressive disorder, single episode, Polyarthropathy or polyarthritis of multiple sites, Retinal detachment, and Urethral stricture.     72 yo M with hx of urothelial carcinoma with osseous metastasis, left ureteral/kidney urothelial cancer status post local treatment and chemotherapy, status post nephrostomy tube initially placed 04/2022, s/p exchange on 01/31/23, chronic pain, opiate dependence, neuropathic pain, cancer related nausea and vomiting, BPH s/p TURP, pulmonary nodule 3 mm, gout     Patient presents to the emergency department with poor p.o. tolerance, generalized fatigue and nausea.  She states symptoms have been progressive in nature over the past week.  He states he has diffuse body pain he is unable to walk.  He states he normally lives with his sister but she is leaving town and patient states he is unable to care for himself for the few days she is out of town.  He is requesting admission to the hospital.  He was scheduled for chemotherapy and a few days and he would like to be admitted for his chemotherapy.  He states he is unable to arrange this as an outpatient with the sister out of town.    He states he is able to eat and drink small sips but has not been eating well.     He denies fevers.  He states that his nephrostomy  tube is leaking where it connects to the bag.  He notes normal ostomy tube output.    He reports poorly controlled pain.  Patient does have history chronic pain.          External Data Sources (Select all that apply):  Discharge summary from 03/14/2019 her.  Patient was admitted for a dislodged nephrostomy tube.    Pertinent Medical History:    PMHx: As above    Past Surgical History:   Procedure Laterality Date    CT INSERTION OF SUPRAPUBIC CATH  09/25/2015    NEPHRECTOMY Right 1955    APPENDECTOMY      COLONOSCOPY      CYSTOSCOPY      CYSTOSCOPY W/ LASER LITHOTRIPSY      OTHER SURGICAL HISTORY      Interstim 01/29/2011    SPINE SURGERY  09/21    Lumbar-sacral fusion    TRANSURETHRAL RESECTION OF PROSTATE         Family History   Adopted: Yes   Family history unknown: Yes       Current Outpatient Medications   Medication Instructions    acetaminophen (TYLENOL) 1,000 mg, Oral, EVERY 8 HOURS PRN    allopurinol (ZYLOPRIM) 100 mg, Oral, DAILY    docusate sodium (COLACE) 100 mg, Oral, 2 TIMES DAILY PRN    DULoxetine (CYMBALTA) 20 mg, Oral, DAILY  ketotifen (ALAWAY) 0.025 % ophthalmic solution 1 drop, Both Eyes, 2 TIMES DAILY    Multiple Vitamins-Minerals (MULTIVITAMIN WITH MINERALS) TABS tablet 1 tablet, Oral, DAILY    naloxone (NARCAN) 4 mg/0.1 mL nasal spray For suspected opioid overdose, call 911! Then spray once in one nostril. Repeat after 3 minutes if no or minimal response using a new spray in other nostril.    Nutritional Supplements (NEPRO) LIQD 3 Cans, Oral, DAILY    nystatin (MYCOSTATIN) 500,000 Units, Oral, 4 TIMES DAILY    oxyCODONE (ROXICODONE) 10 MG tablet Take 1.5 tablets to 2 tablets (15mg  to 20mg ) by mouth every 4 hours as needed for pain    polyethylene glycol (MIRALAX) 17 g, Oral, DAILY PRN, IN 120 - 240 ML OF FLUID    pregabalin (LYRICA) 100 mg, Oral, AT BEDTIME    prochlorperazine (COMPAZINE) 10 mg, Oral, EVERY 6 HOURS PRN    senna (SENOKOT) 17.2 mg, Oral, DAILY    tamsulosin (FLOMAX) 0.4 MG  capsule TAKE 1 CAPSULE BY MOUTH EVERY DAY    traZODone (DESYREL) 25 mg, Oral, AT BEDTIME    triamcinolone (KENALOG) 0.1 % cream 1 Application., Topical, 2 TIMES DAILY, Apply a thin layer as directed       Physical Exam  BP 125/64 (BP Location: Left arm, BP Patient Position: Sitting)   Pulse 54   Temp 98.2 F (36.8 C)   Resp 17   Ht 5\' 6"  (1.676 m)   Wt 69.1 kg (152 lb 5.4 oz)   SpO2 99%   BMI 24.59 kg/m   Physical Exam  Constitutional:       General: He is not in acute distress.     Appearance: Normal appearance. He is well-developed. He is not ill-appearing, toxic-appearing or diaphoretic.   HENT:      Head: Normocephalic and atraumatic.   Eyes:      Extraocular Movements: Extraocular movements intact.      Comments: Moves eyes spontaneously    Neck:      Comments: Able to touch chin to sternum without pain  Cardiovascular:      Rate and Rhythm: Normal rate and regular rhythm.      Heart sounds: No murmur heard.     No gallop.   Pulmonary:      Effort: Pulmonary effort is normal. No respiratory distress.      Breath sounds: Normal breath sounds. No wheezing, rhonchi or rales.   Chest:      Chest wall: No tenderness.   Abdominal:      General: Abdomen is flat.      Palpations: Abdomen is soft. There is no mass.      Tenderness: There is no abdominal tenderness. There is no right CVA tenderness, left CVA tenderness, guarding or rebound.      Comments: Appears flat   Musculoskeletal:         General: No swelling or tenderness. Normal range of motion.      Cervical back: Normal range of motion and neck supple. No rigidity.      Right lower leg: No edema.      Left lower leg: No edema.      Comments: Moves all extremities   Skin:     General: Skin is warm.   Neurological:      Mental Status: He is alert and oriented to person, place, and time.   Psychiatric:         Mood and Affect: Mood normal.  Orders/Medications    Orders Placed This Encounter   Procedures    X-Ray Chest Single View    CBC w/ Diff  [place]    Comprehensive Metabolic Panel    Lipase, Blood Green Plasma Separator Tube    Urinalysis with Culture Reflex, when indicated    Lactate, Blood - See Instructions       Medications   oxyCODONE (ROXICODONE) tablet 10 mg (has no administration in time range)   nalOXone (NARCAN) injection 0.1 mg (has no administration in time range)       Medical Decision Making/Assessment/Plan    This is a(n) 72 year old male who has a past medical history of Chronic back pain, Congenital hydronephrosis, Gout, Headache, Hematuria, HTN (hypertension) (12/10/2021), Kidney disease, Kidney stones, Major depressive disorder, single episode, Polyarthropathy or polyarthritis of multiple sites, Retinal detachment, and Urethral stricture. and presents with poorly controlled cancer related pain, generalized fatigue and weakness, poor p.o. intake.  The patient states he feels like he is unable to care for himself at this time.  He has declining a SNF transfer.  He states he needs help for a few days where his sister is out of town and he can return.  At this time, I doubt an acute process.  Patient's nephrostomy tube in tact with adequate drainage. No leaking observed.   Dx/tx limited by SDOH: Problems related to education and literacy. These factors potentially limit the patient's ability to obtain adequate follow up and/or comply with treatment.    ED Course/Updates/Disposition  ED Course as of 03/21/23 1311   Melina Modena Corliss's Documentation   Tue Mar 21, 2023   1306 Lactate: 0.9  Normal   1306 Creatinine: 0.99  Normal   1307 Hgb(!): 10.0  Low but baseline   1307 Leuk Esterase(!): 250  Trace, improved when compared to prior   1307 No acute findings to explain the provided history except for new port currently being accessed, and possible skeletal metastases worsened or new since last chest x-ray May 5th of last year       CMS-HCC/Risk Adjustment Factor Diagnoses       Needs Recertification        Category and Diagnosis From     CMS-HCC 47:  Immunodeficiency, unspecified (CMS-HCC) Tawny Asal, MD on 01/10/2022    CMS-HCC 52:  Opioid overdose (CMS-HCC) [place] on 01/29/2023    CMS-HCC 35:  Major depressive disorder, recurrent, unspecified (CMS-HCC) [facility] 72:  Other specified diseases of spinal cord (CMS-HCC) Zlomislic, Vinko, MD on [phone]    CMS-HCC 188:  Encounter for attention to other artificial openings of urinary tract (CMS-HCC) Carlota Raspberry, MD on 11/26/2022                         After discussion with the team of Dr. 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     Data Reviewed:        Risk of Complications and/or Morbidity:               Fredirick Maudlin, MD  03/21/23 1310       Fredirick Maudlin, MD  03/21/23 1311

## 2023-03-21 NOTE — ED Notes (Signed)
Bed: S  Expected date:   Expected time:   Means of arrival: Automobile  Comments:

## 2023-03-21 NOTE — Telephone Encounter (Signed)
Patient called requesting to speak to Elmyra Ricks in regards to previous message, states he will go to ER in ambulance since he is unable to walk. Please assist.    [place]: [phone]

## 2023-03-22 DIAGNOSIS — C7951 Secondary malignant neoplasm of bone: Secondary | ICD-10-CM

## 2023-03-22 DIAGNOSIS — M545 Low back pain, unspecified: Secondary | ICD-10-CM

## 2023-03-22 DIAGNOSIS — E43 Unspecified severe protein-calorie malnutrition: Secondary | ICD-10-CM

## 2023-03-22 DIAGNOSIS — Z7409 Other reduced mobility: Secondary | ICD-10-CM

## 2023-03-22 DIAGNOSIS — E46 Unspecified protein-calorie malnutrition: Secondary | ICD-10-CM

## 2023-03-22 DIAGNOSIS — G893 Neoplasm related pain (acute) (chronic): Principal | ICD-10-CM

## 2023-03-22 DIAGNOSIS — R110 Nausea: Secondary | ICD-10-CM

## 2023-03-22 DIAGNOSIS — G8929 Other chronic pain: Secondary | ICD-10-CM

## 2023-03-22 DIAGNOSIS — Z515 Encounter for palliative care: Secondary | ICD-10-CM

## 2023-03-22 DIAGNOSIS — K5900 Constipation, unspecified: Secondary | ICD-10-CM

## 2023-03-22 DIAGNOSIS — C689 Malignant neoplasm of urinary organ, unspecified: Secondary | ICD-10-CM

## 2023-03-22 LAB — CBC WITH DIFF, BLOOD
ANC-Automated: 1.3 10*3/uL — ABNORMAL LOW (ref 1.6–7.0)
Abs Basophils: 0 10*3/uL (ref <0.2)
Abs Eosinophils: 0.2 10*3/uL (ref 0.0–0.5)
Abs Lymphs: 1.1 10*3/uL (ref 0.8–3.1)
Abs Monos: 0.5 10*3/uL (ref 0.2–0.8)
Basophils: 1 %
Eosinophils: 5 %
Hct: 25.8 % — ABNORMAL LOW (ref 40.0–50.0)
Hgb: 8.5 gm/dL — ABNORMAL LOW (ref 13.7–17.5)
Lymphocytes: 36 %
MCH: 27.8 pg (ref 26.0–32.0)
MCHC: 32.9 g/dL (ref 32.0–36.0)
MCV: 84.3 um3 (ref 79.0–95.0)
MPV: 10.1 fL (ref 9.4–12.4)
Monocytes: 16 %
Plt Count: 212 10*3/uL (ref 140–370)
RBC: 3.06 10*6/uL — ABNORMAL LOW (ref 4.60–6.10)
RDW: 16.9 % — ABNORMAL HIGH (ref 12.0–14.0)
Segs: 41 %
WBC: 3.2 10*3/uL — ABNORMAL LOW (ref 4.0–10.0)

## 2023-03-22 LAB — BASIC METABOLIC PANEL, BLOOD
Anion Gap: 8 mmol/L (ref 7–15)
BUN: 19 mg/dL (ref 8–23)
Bicarbonate: 29 mmol/L (ref 22–29)
Calcium: 9.1 mg/dL (ref 8.5–10.6)
Chloride: 102 mmol/L (ref 98–107)
Creatinine: 1.18 mg/dL — ABNORMAL HIGH (ref 0.67–1.17)
Glucose: 84 mg/dL (ref 70–99)
Potassium: 3.9 mmol/L (ref 3.5–5.1)
Sodium: 139 mmol/L (ref 136–145)
eGFR Based on CKD-EPI 2021 Equation: >60 mL/min/{1.73_m2}

## 2023-03-22 LAB — URINE CULTURE: Urine Culture Result: NO GROWTH

## 2023-03-22 LAB — PHOSPHORUS, BLOOD: Phosphorous: 4.6 mg/dL — ABNORMAL HIGH (ref 2.7–4.5)

## 2023-03-22 LAB — MAGNESIUM, BLOOD: Magnesium: 1.7 mg/dL (ref 1.6–2.4)

## 2023-03-22 LAB — CANDIDA AURIS NUCLEIC ACID DETECTION TEST: Candida auris Nucleic Acid Detection Test: NOT DETECTED

## 2023-03-22 LAB — MRSA SURVEILLANCE CULTURE

## 2023-03-22 MED ORDER — POLYETHYLENE GLYCOL 3350 OR PACK
17.0000 g | PACK | Freq: Two times a day (BID) | ORAL | Status: DC
Start: 2023-03-22 — End: 2023-03-24
  Administered 2023-03-22 – 2023-03-24 (×4): 17 g via ORAL
  Filled 2023-03-22 (×5): qty 1

## 2023-03-22 NOTE — Progress Notes (Signed)
Hospital Medicine Daily Progress Note    SUBJECTIVE AND INTERVAL EVENTS:     NAEON    Patient is doing okay. He really wants to know if he will still get his chemo infusion tomorrow. He feels like his pain is okay but is okay with meeting with palliative care today.     Review of systems: no fevers or chills    OBJECTIVE:  BP  Min: 104/90  Max: 129/63  Temp  Min: 97.9 F (36.6 C)  Max: 98.9 F (37.2 C)  Pulse  Min: 52  Max: 66  Resp  Min: 17  Max: 18  SpO2  Min: 95 %  Max: 98 %  Weight  Min: 71.4 kg (157 lb 8 oz)  Max: 71.4 kg (157 lb 8 oz)    BP 115/65 (BP Location: Right arm, BP Patient Position: Semi-Fowlers)   Pulse 57   Temp 98.8 F (37.1 C)   Resp 18   Ht 5\' 6"  (1.676 m)   Wt 71.4 kg (157 lb 8 oz)   SpO2 95%   BMI 25.42 kg/m     Physical Examination:    General: resting comfortably in bed  HEENT: PERRL, moist mucous membranes, oropharynx without erythema or exudates  Cardiovascular: No JVD. Regular rate and rhythm. Normal S1,S2. No murmurs appreciated. No rubs or gallops appreciated. Extremities warm and well-perfused. No lower extremity edema.  Respiratory: Good air entry bilaterally. No prolonged expiratory phase. No wheezes appreciated. No crackles appreciated.  Gastrointestinal: + bowel sounds. Soft, non-distended. No tenderness to palpation. No palpable masses appreciated. No hepatosplenomegaly appreciated.  Neurologic: moving all four extremities, no tremors appreciated   Skin: no lesions or rashes    Glucose:  No results for input(s): "GLUCPOCT" in the last 72 hours.    Pertinent laboratory, imaging, and procedure data: recent results reviewed in chart.    Na 139 (04/03) CL 102 (04/03) BUN 19 (04/03) GLU   84 (04/03)   K 3.9 (04/03) CO2 29 (04/03) Cr 1.18* (04/03)      WBC 3.2* (04/03) HGB 8.5* (04/03) PLT 212 (04/03)    HCT 25.8* (04/03)      TP 6.9 (04/02) ALT 11 (04/02) TBILI 0.57 (04/02) ALK PHOS  115 (04/02)   ALB 3.7 (04/02) AST 22 (04/02) DBILI        ASSESSMENT AND PLAN:  Stephen [name] is a 72 year old male with a history of metastatic urothelial carcinoma (bone) s/p L nephrostomy and ureteral stent placement s/p chemoradiation, most recently on EV/pembrolizumab (last given 03/03/23), CKD, gout, BPH s/p TURP, insomnia, and recent admission to [facility] (3/25-3/26) for a dislodged nephrostomy tube who presented w/ a constellation of symptoms including nausea, poor PO intake, weight loss, constipation, and generalized weakness and has been admitted for failure to thrive.     #Severe protein calorie malnutrition  #Unintentional weight loss  #Poor PO intake  #Generalized weakness  #Nausea without vomiting  #Abdominal pain  #Constipation  Reports long-standing nausea without vomiting and epigastric abdominal pain worsening recently. Severely limiting PO intake and believes he has lost 40 lb in the past month. Suspect this is all 2/2 cancer and treatment. Lipase and LFT's normal. Abdominal pain may also be at least partially related to constipation.   - Antiemetics              - Zofran first line              - Compazine second line  -  Increase Miralax to BID dosing  - Continue Senna BID  - Nutrition consulted, appreciate recommendations  - PT/OT     #L hip pain  #Chronic back pain  #Cancer pain  - Consult palliative care  - Tylenol 975 mg Q8H PRN  - Oxycodone 5 mg q4h PRN  - Lidocaine patches PRN     #Metastatic urothelial carcinoma s/p  #L nephrostomy and ureteral stent placement: See note from [place], [place] on 3/13 for details of care. Most recently started on EV/pembrolizumab, last received 03/03/23. Nephrostomy exchanged most recently 03/14/23. Undergoing XRT to right shoulder.   - Dr. Nicole Kindred (primary Onc)     #CKD: Kidney function at baseline.   - Avoid nephrotoxic agents  - Renally dose all medications     #Normocytic anemia  - Daily CBC     #Neuropathic pain  -Continue home pregabalin 100 mg nightly     #Gout  - Continue home allopurinol 100 mg daily     #BPH s/p TURP  - Continue home  tamsulosin 0.4 mg daily     #Diet: Regular diet  #Bowel regimen: Senna and miralax BID PRN  #VTE prophylaxis: Heparin  #Proxy decision maker: Stephen [name], [phone]  #Code status: Full code     Disposition Planning 03/22/2023:  Days to Complete Medical Plan of Care Requiring Ongoing Hospitalization: 2 days out; Care Needs: Clinical Improvement/Symptom Control: pain, nausea, PO intake                 (Discharge Planning and EDD Info)  Other remaining discharge needs:  TBD    Discharge location: Home with home health  Outpatient Follow-up Needed: Oncology        Cheral Almas, MD  Family Medicine PGY-3  [facility]

## 2023-03-22 NOTE — Consults (Signed)
PALLIATIVE CARE TEAM INITIAL CONSULT    Requesting Physician: Enid Baas*  Reason for Consult: To evaluate patient for pain and nausea.    HPI: Stephen [name] is a 72 year old man with past medical history of metastatic urothelial carcinoma (mets to bone, s/p left nephrostomy and ureteral stent placement s/p chemotherapy/immunotherapy radiation, most recently on EV/pembrolizumab, last given 03/03/23), CKD, gout, BPH s/p TURP, insomnia, and recent admission to [facility] (3/25-3/26) for dislodged nephrostomy tube admitted on 03/21/2023 with with nausea, poor PO intake, generalized weakness.  Familiar to our service from outpatient clinic    History from review of >3 external notes from primary & consulting teams and unit staff:   -lives with sister who helps care for him, she is currently out of town at a funeral  -ambulated with PT with walker      History from patient:   -Pain in bilateral shoulders and R lateral - no acute changes, controlled with current regimen  -Does not take oxycodone consistently at home, mentions recent reductions in medications  -Intermittent nausea, improved after dose of compazine, poor appetite   -No BM in past couple of days, does not feel constipated  -Hoping that he will be able to get to his oncology infusion tomorrow. Has missed some prior and worries about missing more.     Additional history from most recent outpatient palliative visit:  -recent admission at [facility] with concern for possible overdose.   "Patient expresses desire to minimize medications as much as he can to avoid a similar episode." Methadone and cymbalta discontinued, lyrica dose reduced, oxy 10mg  PO Q4h prn, scheduled tylenol 100mg  PO Q8h prn   Rad Onc referral in place    Medication administration record reviewed in detail, pertinent findings include:    allopurinol  100 mg Daily    heparin  5,000 Units Q8H    lidocaine  1 patch Q24H    multivitamin with minerals  1 tablet Daily    polyethylene glycol  17  g Q12H    pregabalin  100 mg HS    senna  2 tablet BID    sodium chloride  3 mL Q8H    tamsulosin  0.4 mg Daily    traZODone  50 mg HS      acetaminophen  975 mg Q8H PRN    nalOXone  0.1 mg Q2 Min PRN    ondansetron  4 mg Q8H PRN    Or    ondansetron  4 mg Q8H PRN    oxyCODONE  5 mg Q4H PRN    prochlorperazine  5 mg Q6H PRN    Or    prochlorperazine  5 mg Q6H PRN    sodium chloride  3 mL PRN    sodium chloride   Continuous PRN     Morphine equivalent daily dose 30mg ; The patient is on opioids and requires frequent monitoring of renal and hepatic function.    Examination:  BP 129/63 (BP Location: Right arm, BP Patient Position: Semi-Fowlers)   Pulse 52   Temp 98.9 F (37.2 C)   Resp 18   Ht 5\' 6"  (1.676 m)   Wt 71.4 kg (157 lb 8 oz)   SpO2 98%   BMI 25.42 kg/m   Appearance: NAD  Level of alertness: AxO+4, mildly tangential at times     I independently reviewed & interpreted all interval labs and imaging with notable findings of: elevated creatinine with normal GFR, normal H/H, normal hepatic function  ASSESSMENT  Palliative Problem List:  metastatic urothelial carcinoma (bone) s/p L nephrostomy and ureteral stent placement s/p chemoradiation , which poses a threat to life and which informs my clinical approach and decision-making  Cancer related pain - bony metastases  Constipation - at risk for due to opioid regimen and decreased PO intake  Nausea  Frailty    Functional status: Palliative Performance Scale* 60%  Health care agent: Identified and documented;  sister - Pam    RECOMMENDATIONS  Pain  Continue current regimen -   APAP 975mg  Po Q8h prn mild pain  Oxycodone 5mg  Q4h prn mod-severe pain (home regimen of 10mg  Q4h prn but pain currently controlled at lower dose so will not increase)  Lidocaine patch  Lyrica 100mg  PO nightly  Agree with outpatient radiation oncology consult    Constipation  Continue Miralax 2x/day, Senna 2 tabs PO 2x/day    Nausea  Continue with current prn compazine (home regimen of  compazine 10mg  PO Q6h prn)    Has outpatient palliative clinic visit scheduled for 4/22.    Management details from above were discussed with Dr. Gilford Raid from hospital medicine team today. Thank you very much for involving [name] in [employer] care.  Please do not hesitate to contact us with any further questions.     --  Flossie Dibble, NP  [place] Team 2   Team 2 pager [phone]   [facility]

## 2023-03-22 NOTE — Plan of Care (Signed)
Problem: Promotion of Health and Safety  Goal: Promotion of Health and Safety  Description: The patient remains safe, receives appropriate treatment and achieves optimal outcomes (physically, psychosocially, and spiritually) within the limitations of the disease process by discharge.    Information below is the current care plan.  Outcome: Progressing  Flowsheets  Taken 03/22/2023 1216 by Abigail Butts, RN  Outcome Evaluation (rationale for progressing/not progressing) every shift: A&O x4, VSS and afebrile. Discussed POC including nausea and pain management. medicated with PO oxycodone Q 4 hrs. pt reported mild pain relief. educated regarding all safety measures including fall precautions. Encouraged ambulation and educated regarding dvt prevention and infection control measures. Pt remained safe and in no acute distress at this time. Plan of care ongoing.  Taken 03/22/2023 0729 by Abigail Butts, RN  Patient /Family stated Goal: pain managment  Taken 03/22/2023 0226 by Gwynneth Aliment, RN  Guidelines: Inpatient Nursing Guidelines  Individualized Interventions/Recommendations #1: monitor pain level.  Individualized Interventions/Recommendations #2 (if applicable): monitor for nausea.  Individualized Interventions/Recommendations #3 (if applicable): maintain safety.  Individualized Interventions/Recommendations #4 (if applicable): clustering care to promote rest and healing.  Individualized Interventions/Recommendations #5 (if applicable): encouraged to call for assistance as needed.  Note:

## 2023-03-22 NOTE — Interdisciplinary (Signed)
Occupational Therapy Evaluation and Discharge    Admitting Physician:  Enid Baas*  Admission Date 03/21/2023    Inpatient Diagnosis:   Problem List         Codes    Impaired functional mobility, balance, gait, and endurance    -  Primary ICD-10-CM: Z74.09  ICD-9-CM: V49.89    Decreased activities of daily living (ADL)     ICD-10-CM: Z78.9  ICD-9-CM: V49.89            IP Start of Service  Start of Care: 03/22/23  Onset Date: 03/21/2023  Reason for referral: Decline in performance of activities of daily living (ADL)    Preferred [place]         Past Medical History:   Diagnosis Date    Chronic back pain     Congenital hydronephrosis     Gout     Headache     Hematuria     HTN (hypertension) 12/10/2021    Kidney disease     Kidney stones     Major depressive disorder, single episode     Polyarthropathy or polyarthritis of multiple sites     Retinal detachment     Urethral stricture       Past Surgical History:   Procedure Laterality Date    CT INSERTION OF SUPRAPUBIC CATH  09/25/2015    NEPHRECTOMY Right 1955    APPENDECTOMY      COLONOSCOPY      CYSTOSCOPY      CYSTOSCOPY W/ LASER LITHOTRIPSY      OTHER SURGICAL HISTORY      Interstim 01/29/2011    SPINE SURGERY  09/21    Lumbar-sacral fusion    TRANSURETHRAL RESECTION OF PROSTATE          OT Acute       Row Name 03/22/23 1300          Type of Visit    Type of Occupational Therapy note Occupational Therapy Evaluation and Discharge       [place] Name 03/22/23 1300          Treatment Time    Treatment Start Time 1140     Total TIMED Treatment (min) 30     Total Treatment Time (min) 76       Row Name 03/22/23 1300          Treatment Precautions/Restrictions    Precautions/Restrictions Fall     Fall Socks/charm       Row Name 03/22/23 1300          Medical History    History of presenting condition 72 year old male with a history of 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     Fall history Discussed fall prevention education with patient       [place] Name 03/22/23 1300          Functional History    General ADL/Self-Care Assistance Needs Needed assist with ADLs and self care     Equipment required for mobility in the home Odelia Gage Name 03/22/23 1300          Social History    Living Situation Lives with  family     [place] accessibility Performs activities of daily living (ADL's) on one level     Other Social History Information Lives with sister and niece in a [facility]. Sister is able to provide assistance as needed. Pt typically ambulates with SPC and is able to complete ADL tasks with occassional assist.       Row Name 03/22/23 1300          Subjective    Subjective information Pt found standing at EOB with RN present     Patient status Patient agreeable to treatment;Nursing in agreement for treatment       Row Name 03/22/23 1300          Pain Assessment    Pain Asssessment Tool Numeric Pain Rating Scale       Row Name 03/22/23 1300          Numeric Pain Rating Scale    Pain Intensity - rating at present 9     Pain Intensity- rating after treatment 9     Location generalized - shoulders, BLE, low back       Row Name 03/22/23 1300          Activities of Daily Living (ADLs)    Self Feeding Supervised     Other Self Feeding Information set up of items due to needing occassional assist to open containers     Self Grooming Supervised     Other Self Grooming Information SUP during stance phase - pt able to Ind complete g/h tasks     Lower Body Dressing Supervised     Other Lower Body Dressing Information requires extra time to reach towards feet, SUP for stance     Toileting Supervised      Toilet Transfers Supervised     Other Toilet Transfers Information lift and lower       Row Name 03/22/23 1300          [place] AM-PAC: Daily Activity    Assistance Needed to Put on and Take off Regular Lower Body Clothing 3     Assistance Needed to Bathe, Including Washing, Rinsing, and Drying 3     Assistance Needed to Toilet Environmental manager, Bedpan, or Urinal) 3     Assistance Needed to Put on and Take off Regular Upper Body Clothing 3     Assistance Needed to Take Care of Personal Grooming Such as Brushing Teeth 4     Assistance Needed to Eat Meals 4     AM-PAC Daily Activity Total Score 20     AMP-PAC Daily Activity Impairment rating Score 20-22 - 20-39% impaired       Row Name 03/22/23 1300          Objective    Overall Cognitive Status Intact - no cognitive limitations or impairments noted     Other  Cognitive Status Information A&O x4     Communication No communication limitations or impairments noted. Current status of hearing, speech and vision allow functional communication.     Coordination/Motor control No limitations or impairments noted. Movement patterns are fluid and coordinated throughout     Balance Balance limitations present     Static Sitting Balance Good - able to maintain balance without handhold support, limited postural sway     Dynamic Sitting Balance Good - accepts moderate challenge, able to maintain balance while picking object off floor     Static Standing Balance Good - able to  maintain balance without handhold support, limited postural sway     Dynamic Standing Balance Fair - accepts minimal challenge, able to maintain balance while turning head/trunk     Extremity Assessment Range of motion, strength,  muscle tone and/or sensation limitations present     LUE findings baseline dec shoulder ROM, limited by pain. AAROM 0-90     RUE findings baseline dec shoulder ROM, limited by pain. AAROM 0-90     LLE findings see PT note     RLE findings see PT note     Functional Mobility Functional mobility  deficits present     Bed Mobility Modified independent     Bed Mobility Comments EOB to supine     Transfers to/from Stand Supervised     Transfer Comments STS from EOB, no AE     Ambulation during functional tasks Supervised     Device used for ambulation/mobility Front wheeled walker     Ambulation Distance ADL and household related mobility     Other Objective Findings Instructed in ADL and functional mobility. Pt initially not using FWW and required SBA-CGA with mild unsteady gait. Given FWW and improved to SUP, pt reports having FWW at home and can start using. Edu provided on importance of continued out of bed ADLs and mobilization while in house to prevent deconditioning, verbalized understanding. Left in bed with call light in reach, all needs met.                     OT Acute Tool Box       Row Name 03/22/23 1300          Cognition Assessment    Overall Cognitive Status Intact - no cognitive limitations or impairments noted                        Eval cont.       [place] Name 03/22/23 1300          Patient/Family Education    Learner(s) Patient     Learner response to rehab patient education interventions Verbalizes understanding;Able to return demonstrate teaching     Patient/family training comments OT POC, continued out of bed with staff       Row Name 03/22/23 1300          Assessment    Assessment Pt is currently performing at SUP level for functional transfers, functional mobility and ADL tasks. Demos mild deconditioning and able to show improved balance with use of FWW. Pt with no further acute OT needs, able to continue to mobilize with nursing staff while in house. Will d/c from skilled OT services at this time.    POST ACUTE REHABILITATION DISCHARGE RECOMMENDATIONS:    Recommended Post-Acute Therapy Follow up:   Home Health    Patient appropriate for discharge to prior living situation    Deficits :   All mobility related ADLs assist level: Supervision  Toileting assist level: Supervision  Bathing  assist level: Supervision  Lower Body Dressing assist level: Supervision      Other Post Acute Discharge Recommendations : inc family support as needed for ADL and IADL tasks.    Equipment recommendations:   Shower chair  Other shower stool vs chair pending on size ( pt reports having small tub)         [place] Name 03/22/23 1300          Treatment Plan Disussion    Treatment  Plan Discussion and Agreement Patient/family/caregiver stated understanding and agreement with the therapy plan       Row Name 03/22/23 1300          Treatment Plan    Continue therapy to address Other (comments)  d/c     Frequency of treatment Patient appropriate for discharge from therapy     Duration of treatment (number of visits) One time only, further treatment not indicated     Status of treatment One time only treatment, further skilled therapy not indicated       [place] Name 03/22/23 1300          Patient Safety Considerations     Patient safety considerations Patient returned to bed at end of treatment;Call light left in reach and fall precautions in place;Nursing notified of safety considerations at end of treatment     Patient assistive device requirements for safe ambulation [place] Picket Name 03/22/23 1300          Patient Mobilization Recommendations (as tolerated)     Toileting Patient able to use bathroom     Ambulation Patient to ambulate with nursing two times per day shift, once per night shift     Out of bed Patient out of bed to chair for all meals     Device  Walker     Safe patient handling equipment  None needed       [place] Name 03/22/23 1300          Therapy Plan Communication    Therapy Plan Communication Discussed therapy plan with Nursing and/or Physician       [place] Name 03/22/23 1300          Occupational Therapy Patient Discharge Instructions    Your Occupational Therapist suggests the following Continue to complete your self care Activities of Daily Living as frequently as possible       Row Name 03/22/23 1300           Type of Eval    Moderate Complexity [phone]) Completed       Row Name 03/22/23 1300          Therapeutic Procedures    Self-Care/ADL Training [phone]) Activities of daily living training;Dressing;Grooming;Patient education;Personal hygiene         Total TIMED Treatment (min) 30                     The occupational therapist of record is endorsed by evaluating occupational therapist.

## 2023-03-22 NOTE — Plan of Care (Signed)
Problem: Promotion of Health and Safety  Goal: Promotion of Health and Safety  Description: The patient remains safe, receives appropriate treatment and achieves optimal outcomes (physically, psychosocially, and spiritually) within the limitations of the disease process by discharge.    Information below is the current care plan.  Outcome: Progressing  Flowsheets  Taken 03/22/2023 0226  Guidelines: Inpatient Nursing Guidelines  Individualized Interventions/Recommendations #1: monitor pain level.  Individualized Interventions/Recommendations #2 (if applicable): monitor for nausea.  Individualized Interventions/Recommendations #3 (if applicable): maintain safety.  Individualized Interventions/Recommendations #4 (if applicable): clustering care to promote rest and healing.  Individualized Interventions/Recommendations #5 (if applicable): encouraged to call for assistance as needed.  Outcome Evaluation (rationale for progressing/not progressing) every shift: Patient arrived to unit from [place] with ccp. A/Ox4, calm and cooperative. Able to make needs known. Not in respiratory distress. IVF NS ongoing. Pain controlled with current regimen. Safety measures maintained. Call light in reach at all times. Plan of care ongoing. Will continue to monitor.  Taken 03/22/2023 0207  Patient /Family stated Goal: pain control  Note:

## 2023-03-22 NOTE — Interdisciplinary (Signed)
Unable to do CM assessment due to other duties.     Nita Sickle, RN, CM  Care Management

## 2023-03-22 NOTE — Interdisciplinary (Signed)
Physical Therapy Evaluation and Discharge    Admitting Physician:  Enid Baas*  Admission Date 03/21/2023    Inpatient Diagnosis:   Problem List         Codes    Impaired functional mobility, balance, gait, and endurance    -  Primary ICD-10-CM: Z74.09  ICD-9-CM: V49.89            IP Start of Service   Start of Care: 03/22/23  Onset Date: 4/2  Reason for referral: Activity tolerance limitation;Decline in functional ability/mobility    Preferred Language:English         Past Medical History:   Diagnosis Date    Chronic back pain     Congenital hydronephrosis     Gout     Headache     Hematuria     HTN (hypertension) 12/10/2021    Kidney disease     Kidney stones     Major depressive disorder, single episode     Polyarthropathy or polyarthritis of multiple sites     Retinal detachment     Urethral stricture       Past Surgical History:   Procedure Laterality Date    CT INSERTION OF SUPRAPUBIC CATH  09/25/2015    NEPHRECTOMY Right 1955    APPENDECTOMY      COLONOSCOPY      CYSTOSCOPY      CYSTOSCOPY W/ LASER LITHOTRIPSY      OTHER SURGICAL HISTORY      Interstim 01/29/2011    SPINE SURGERY  09/21    Lumbar-sacral fusion    TRANSURETHRAL RESECTION OF PROSTATE          PT Acute       Row Name 03/22/23 1100          Type of Visit    Type of Physical Therapy note Physical Therapy Evaluation and Discharge       Row Name 03/22/23 1100          Treatment Precautions/Restrictions    Precautions/Restrictions Fall     Fall Socks/charm       Row Name 03/22/23 1100          Medical History    History of presenting condition Stephen [name] is a 72 year old male with a history of metastatic urothelial carcinoma (bone) s/p L nephrostomy and ureteral stent placement s/p chemoradiation, most recently on EV/pembrolizumab (last given 03/03/23), CKD, gout, BPH s/p TURP, insomnia, and recent admission to [place] (3/25-3/26) for a dislodged nephrostomy tube who presented w/ a constellation of symptoms including nausea, poor PO  intake, weight loss, constipation, and generalized weakness.       [place] Name 03/22/23 1100          Functional History    Prior Level of Function Minimal deficits     Equipment required for mobility in the home Cane;Walker     Other Functional History Information ambulates with SPC vs FWW       Row Name 03/22/23 1100          Social History    Living Situation Lives with parent/family     [place] accessibility  Performs activities of daily living (ADL's) on one level;Accessible with wheelchair or walker     Other Social History Information lives with sister in [facility] with 0 STE       Row Name 03/22/23 1100          Subjective  Subjective Information I need this emptied (drain)     Patient status Patient agreeable to treatment;Nursing in agreement for treatment       Row Name 03/22/23 1100          Pain Assessment    Pain Asssessment Tool Numeric Pain Rating Scale       Row Name 03/22/23 1100          Numeric Pain Rating Scale    Pain Intensity - rating at present 4     Pain Intensity- rating after treatment 4     Location multiple C/O pain RN notified       [place] Name 03/22/23 1100          Objective    Overall Cognitive Status Intact - no cognitive limitations or impairments noted     Communication No communication limitations or impairments noted. Current status of hearing, speech and vision allow functional communication.     Coordination/Motor control No limitations or impairments noted. Movement patterns are fluid and coordinated throughout     Balance Balance limitations present     Static Sitting Balance Normal - able to maintain steady balance without handhold support     Dynamic Sitting Balance Good - accepts moderate challenge, able to maintain balance while picking object off floor     Static Standing Balance Good - able to maintain balance without handhold support, limited postural sway     Dynamic Standing Balance Fair - accepts minimal challenge, able to maintain balance while  turning head/trunk     Other Balance Information FWW best for ambulation     Extremity Assessment Range of motion, strength,  muscle tone and/or sensation limitations present     Other  Extremity Assessment  Information 4/5     Functional Mobility Functional mobility deficits present     Bed Mobility Independent     Bed Mobility Comments supine to sit independent     Transfers to/from Stand Independent     Transfer Comments sit to stands with FWW independent     Gait Supervised     Gait Comments kyphotic, multiple head turns     Device used for ambulation/mobility Front wheeled walker     Ambulation Distance 123ft X 2     Step Navigation up/down 3 stairs step to, supervised     Other Objective Findings mobilized as above, multiple C/O pain, left supine, RN/CM notified                          Eval cont.       [place] Name 03/22/23 1100          [place] AM-PAC: Basic Mobility    Assistance Needed to Turn from Back to Side While in a Flat Bed Without Using Bedrails 4 - None (independent)     Difficulty with Supine to Sit Transfer 4 - None (independent)     How Much Help Needed to Move to/from Bed to Chair 4 - None (independent)     Difficulty with Sit to Stand Transfer from Chair with Arms 4 - None (independent)     How Much Help Needed to Walk in Room 3 - A little (supervised/min assist)     How Much Help Needed to Climb 3-5 Steps with a Rail 3 - A little (supervised/min assist)     AMPAC Total Score 22     Assessment: AM-PAC Basic Mobility Impairment Rating Score 19-22 - 20-39% impaired  [place] Name 03/22/23 1100          Patient/Family Education    Learner(s) Patient     Learner response to rehab patient education interventions Verbalizes understanding;Able to return demonstrate teaching     Patient/family training comments ambulate with [place] Name 03/22/23 1100          Assessment    Assessment Patient mobilized well with PT able to ambulate around unit with FWW and climb stairs all supervised. PT will sign  off.  POST ACUTE REHABILITATION DISCHARGE RECOMMENDATIONS:    Recommended Post-Acute Therapy Follow up:   None    Patient appropriate for discharge to prior living situation    Deficits :         Other Post Acute Discharge Recommendations :     Equipment recommendations:   None         Rehab Potential Good       Row Name 03/22/23 1100          Patient stated Goal    Patient stated goal none stated       [place] Name 03/22/23 1100          Treatment Plan    Frequency of treatment Patient appropriate for discharge from therapy     Duration of treatment (number of visits) One time only, further treatment not indicated     Status of treatment Patient appropriate for discharge from therapy     Interdisciplinary Recommendations Occupational Therapy consult       [place] Name 03/22/23 1100          Patient Safety Considerations     Patient safety considerations Patient returned to bed at end of treatment;Call light left in reach and fall precautions in place;Nursing notified of safety considerations at end of treatment     Patient assistive device requirements for safe ambulation [place] Picket Name 03/22/23 1100          Therapy Plan Communication    Therapy Plan Communication Discussed therapy plan with Nursing and/or Physician;Discussed therapy plan and patient's mobility status with Case Manager       [place] Name 03/22/23 1100          Physical Therapy Patient Discharge Instructions    Your Physical Therapist suggests the following Supervision with walking is suggested for increased safety       Row Name 03/22/23 1100          Type of Eval    Low Complexity [phone]) Completed       Row Name 03/22/23 1100          Therapeutic Procedures    Gait Training [phone]) Assistive device training;Gait pattern analysis and treatment of deviations;Patient education;Stair/curb/obstacle navigation training        Total TIMED Treatment (min)  15     Neuromuscular re-education [phone])  Balance activities to improve control of center of gravity  over base of support        Total TIMED Treatment (min)  15     Therapeutic Activities ([id number])  Assistance/facilitation of bed mobility;Facillitation of safety awareness/responses during functional tasks;Dynamic activities to improve performance of  functional tasks/activities;Functional activities;Patient education;Transfer training with weight shift and direction change        Total TIMED Treatment (min)  15       Row Name 03/22/23 1100          Treatment Time  Total TIMED Treatment  (min) 45     Total Treatment Time (min) 60     Treatment start time 1000                        The physical therapist of record is endorsed by evaluating physical therapist.

## 2023-03-22 NOTE — Interdisciplinary (Signed)
Nutrition Note      Evaluation Type: Initial;Consult (malnutrition)    Recommendations:    -Patient meets criteria for Severe Malnutrition    - Continue Regular diet  - Consider SLP consult for pt reported swallowing difficulties   - Add Suplena BID  - Encourage PO of >75% of meals and ONS   - Continue multivitamin w/ minerals given poor PO  - Check Vit D lab given severe malnutrition   - Continue current bowel regimen  - Antiemetics PRN  - Monitor and replete lytes PRN  - Weights q72hrs to trend                                                                                                                                         A: Per MD: Stephen [name] is a 72 year old male with a history of 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    Nutrition Summary   Current Nutrition Regimen: Regular  Source of Information: Chart Review;Spoke with patient  Barriers to Intake: Abdominal pain;Decreased appetite;Nausea  Additional Comments: Pt reports decrease in appetite started around 3 weeks ago or longer, pt reports having stomach pain and feeling like the food just sits there but does not make it into the stomach. Pt reports not being able to eat anything other than a couple bites of food per day and 1 Nepro ONS. Before appetite decrease happened pt reports being a big eater and eating a variety of different foods. Noted pt is edentulous but reports no interference with eating as he is able to self-select softer foods. Pt reports having a small throat and feeling like foods don't move down throat easily and has a tight  feeling when eating. Pt reports feeling nauseas daily when at home. Pt was eating breakfast during visit and stated this is the first time he has been able to eat anything for months but is worried he might vomit, Zofran given this AM per EMR. Pt reports he does not experience vomitting at home, just nausea. Pt reports constipation, no diarrhea, last BM was 2 days ago. Pt reports not being able to drink much water either, consistent with outpatient RD note 3/7.  Pt reports taking a multivitamin and home and has no NKFA. Pt prefers renal friendly ONS - vanilla.  Adequacy of Nutrition Intake: Meeting <25% of estimated needs      Anthropometrics   Height - Most Recent Measurement   03/21/23 5\' 6"  (1.676 m)       Weight For Nutrition Equations: 71.4 kg (157 lb 6.5  oz)  Weight Trends: significant weight loss, 25-30lbs in two months  BMI for Nutrition Calculations: 25.4  Ideal Body Weight (kg): 64.36  Percent of Ideal Body Weight: 110.94 %  Usual Body Weight (Dietary): 81.6 kg (180 lb) (Per pt and EMR)  Change from UBW (%): -12.55 %  Time Frame of Weight Change From UBW: 1-3 months    Weights (last 14 days)       Date/Time Weight Weight Source Percentage Weight Change (%) Who    03/22/23 0500 71.4 kg (157 lb 8 oz) Bed scale 3.39 % EE    03/21/23 0940 69.1 kg (152 lb 5.4 oz) Standing scale 0 % HE           Wt Readings from Last 20 Encounters:   03/22/23 71.4 kg (157 lb 8 oz)   03/06/23 73.9 kg (163 lb)   03/03/23 75.4 kg (166 lb 3.6 oz)   03/01/23 74.5 kg (164 lb 4.8 oz)   02/28/23 75 kg (165 lb 5.5 oz)   02/27/23 74.2 kg (163 lb 8 oz)   02/17/23 75.4 kg (166 lb 3.6 oz)   02/17/23 75.4 kg (166 lb 3.6 oz)   02/10/23 77.9 kg (171 lb 11.8 oz)   02/10/23 77.9 kg (171 lb 11.8 oz)   02/09/23 78.2 kg (172 lb 8 oz)   01/27/23 82.3 kg (181 lb 7 oz)   01/27/23 82.3 kg (181 lb 7 oz)   01/26/23 82.2 kg (181 lb 4.8 oz)   01/23/23 85 kg (187 lb 6.4 oz)   01/20/23 84.8 kg (187 lb 1 oz)   01/19/23 83 kg (183 lb)   01/06/23 83 kg (183 lb)    12/15/22 82.6 kg (182 lb 1.6 oz)   12/01/22 82.6 kg (182 lb)       Estimated Needs  Calories: 1928 kcal/day - 2285 kcal/day (27 kcal/kg/day - 32 kcal/kg/day x 71.4 kg (157 lb 6.5 oz))  Protein: 57 g/day - 71 g/day (0.8 g/kg/day - 1 g/kg/day x 71.4 kg (157 lb 6.5 oz))  Fluids: 1928 mL/day - 2285 mL/day (27 mL/kg/day - 32 mL/kg/day x 71.4 kg (157 lb 6.5 oz)) or per MD    Harvel Quale Equation: [id number]      Nutrition Focused Physical Exam   Body Fat Loss    Orbital (!) Severe (03/22/23 1013)   Upper Arm (!) Severe (03/22/23 1013)   Thoracic/Lumbar (!) Moderate (03/22/23 1013)   Muscle Mass Loss    Temple (!) Severe (03/22/23 1013)   Clavicle Bone Region (!) Severe (03/22/23 1013)   Deltoid (!) Moderate (03/22/23 1013)   Scapula Bone Region (!) Moderate (03/22/23 1013)   Interosseous (!) Moderate (03/22/23 1013)   Anterior Thigh (!) Severe (03/22/23 1013)   Patellar Region (!) Severe (03/22/23 1013)   Posterior Calf (!) Severe (03/22/23 1013)   Micronutrient Deficiency Hair falling out per pt (03/22/23 1013)     Edema:         Malnutrition Diagnostic Criteria - Chronic Illness  Malnutrition Designation: Severe  Energy Intake: <75% for greater than or equal to 1 Month  Weight Loss: >5% in 1 Month  Body Fat Loss: Severe  Muscle Mass Loss: Severe  Chronic Dx Status: New    Clinical Considerations:   Allergies: Sulfa drugs  IV Access - Peripheral:     IV Access - Central  Port A Cath Single Lumen  - 02/13/23 Right Internal jugular (Active)     Tubes and Drains:  Nephrostomy -  Left (Active)        GI:  Stool Assessment for the past 168 hrs:   Stool (mL)   03/22/23 0550 0 ml       Skin Integrity:  Skin Integrity (WDL): Within Defined Limits       Wounds/Incisions:       Pressure Injuries:       Labs: reviewed   Recent Labs     03/21/23  1029 03/22/23  0500   NA 138 139   K 4.3 3.9   CL 98 102   BICARB 29 29   BUN 26* 19   CREAT 0.99 1.18*   GLU 117* 84   [place] 9.7 9.1   MG  --  1.7   PHOS  --  4.6*   ALK 115  --    ALT 11   --    AST 22  --    TBILI 0.57  --    ALB 3.7  --    WBC 5.7 3.2*   ABSNEUTRO 4.0 1.3*       No results found for: "CHOL", "HDL", "LDLCALC", "TRIG", "LDLDIRECT"    No results found for: "A1C"    No results for input(s): "GLUCPOCT" in the last 72 hours.    No results found for: "VITD25HYDROX", "VITAMIND25HY", "VD2", "VD3", "VDT"    Medication Review Comments: Reviewed  IV:    sodium chloride       Scheduled:    allopurinol  100 mg Daily    heparin  5,000 Units Q8H    lidocaine  1 patch Q24H    multivitamin with minerals  1 tablet Daily    polyethylene glycol  17 g Q12H    pregabalin  100 mg HS    senna  2 tablet BID    sodium chloride  3 mL Q8H    tamsulosin  0.4 mg Daily    traZODone  50 mg HS       Discharge: pending clinical course    Education: when clinically appropriate     RD/DTR to monitor/evaluate: labs, weight trends, oral or nutrition support status, and signs and symptoms of new skin concerns. Relayed recommendations to MD.     Will continue to follow patient per approved [facility] Nutrition Prioritization Schedule guidelines. Nutrition Services remains available via [facility] should patient medical status change.    Francesca Oman, Dietetic Intern   03/22/2023

## 2023-03-23 ENCOUNTER — Ambulatory Visit
Admission: RE | Admit: 2023-03-23 | Discharge: 2023-03-23 | Disposition: A | Discharge status: Home / Self Care | Payer: Medicare Other

## 2023-03-23 ENCOUNTER — Ambulatory Visit (HOSPITAL_BASED_OUTPATIENT_CLINIC_OR_DEPARTMENT_OTHER): Payer: Medicare Other | Admitting: Hematology & Oncology

## 2023-03-23 ENCOUNTER — Ambulatory Visit (HOSPITAL_BASED_OUTPATIENT_CLINIC_OR_DEPARTMENT_OTHER): Admit: 2023-03-23 | Discharge: 2023-03-23 | Disposition: A | Discharge status: Home Health | Payer: Medicare Other

## 2023-03-23 LAB — CBC WITH DIFF, BLOOD
ANC-Automated: 1.4 10*3/uL — ABNORMAL LOW (ref 1.6–7.0)
Abs Basophils: 0 10*3/uL (ref <0.2)
Abs Eosinophils: 0.2 10*3/uL (ref 0.0–0.5)
Abs Lymphs: 1.4 10*3/uL (ref 0.8–3.1)
Abs Monos: 0.4 10*3/uL (ref 0.2–0.8)
Basophils: 1 %
Eosinophils: 5 %
Hct: 27.5 % — ABNORMAL LOW (ref 40.0–50.0)
Hgb: 8.9 gm/dL — ABNORMAL LOW (ref 13.7–17.5)
Lymphocytes: 40 %
MCH: 27.8 pg (ref 26.0–32.0)
MCHC: 32.4 g/dL (ref 32.0–36.0)
MCV: 85.9 um3 (ref 79.0–95.0)
MPV: 10 fL (ref 9.4–12.4)
Monocytes: 13 %
Plt Count: 221 10*3/uL (ref 140–370)
RBC: 3.2 10*6/uL — ABNORMAL LOW (ref 4.60–6.10)
RDW: 16.8 % — ABNORMAL HIGH (ref 12.0–14.0)
Segs: 41 %
WBC: 3.4 10*3/uL — ABNORMAL LOW (ref 4.0–10.0)

## 2023-03-23 LAB — BASIC METABOLIC PANEL, BLOOD
Anion Gap: 8 mmol/L (ref 7–15)
BUN: 16 mg/dL (ref 8–23)
Bicarbonate: 29 mmol/L (ref 22–29)
Calcium: 9 mg/dL (ref 8.5–10.6)
Chloride: 101 mmol/L (ref 98–107)
Creatinine: 1.04 mg/dL (ref 0.67–1.17)
Glucose: 86 mg/dL (ref 70–99)
Potassium: 3.9 mmol/L (ref 3.5–5.1)
Sodium: 138 mmol/L (ref 136–145)
eGFR Based on CKD-EPI 2021 Equation: >60 mL/min/{1.73_m2}

## 2023-03-23 LAB — MAGNESIUM, BLOOD: Magnesium: 1.6 mg/dL (ref 1.6–2.4)

## 2023-03-23 LAB — VITAMIN D, 25-OH TOTAL: Vitamin D, 25-Hydroxy: 77 ng/mL (ref 30–80)

## 2023-03-23 LAB — PHOSPHORUS, BLOOD: Phosphorous: 4.2 mg/dL (ref 2.7–4.5)

## 2023-03-23 MED ORDER — DICLOFENAC SODIUM 1 % EX GEL
2.0000 g | Freq: Four times a day (QID) | CUTANEOUS | Status: DC | PRN
Start: 2023-03-23 — End: 2023-03-24
  Administered 2023-03-23: 2 g via TOPICAL
  Filled 2023-03-23: qty 100

## 2023-03-23 NOTE — Plan of Care (Signed)
Problem: Promotion of Health and Safety  Goal: Promotion of Health and Safety  Description: The patient remains safe, receives appropriate treatment and achieves optimal outcomes (physically, psychosocially, and spiritually) within the limitations of the disease process by discharge.    Information below is the current care plan.  Outcome: Progressing  Flowsheets  Taken 03/23/2023 0224  Guidelines: Inpatient Nursing Guidelines  Outcome Evaluation (rationale for progressing/not progressing) every shift: Patient is A/Ox4, able to make needs known. VSS and afebrile. Pain controlled with current regimen. Denies nausea and vomiting. Not in distress. Ambulating with walker aound halls independently, safety measures maintained. L nephrostomy patent and intact draining with clear yellow output. Plan of care ongoing. Will continue to monitor.  Taken 03/22/2023 1930  Patient /Family stated Goal: rest  Taken 03/22/2023 0226  Individualized Interventions/Recommendations #1: monitor pain level.  Individualized Interventions/Recommendations #2 (if applicable): monitor for nausea.  Individualized Interventions/Recommendations #3 (if applicable): maintain safety.  Individualized Interventions/Recommendations #4 (if applicable): clustering care to promote rest and healing.  Individualized Interventions/Recommendations #5 (if applicable): encouraged to call for assistance as needed.  Note:

## 2023-03-23 NOTE — Interdisciplinary (Signed)
03/23/23 1719   Initial Assessment   CM Initial Assessment * Completed   Readmission Risk Assessment   Readmission Within 30 Days of Discharge * Yes  (Admitted to [facility] 03/25-03/26)   Admission Was* Unplanned   1. Is your preferred language in EPIC correct? Yes  (N/A - pt originally admitted to [facility], now re-admitted at [facility])   2. What symptom brought you to the hospital? Chemo issues   3.Was this a symptom you had during the last stay? Yes   If Yes- Why do you think it is still there?  Unknown - pt does not feel that [facility] did much to care for him   If Yes- Was it there before you left? Yes   4. Do you feel like you were given enough education before leaving the hospital? No   If No- What do you feel you were missing? Pt does not elaborate much but continues to state that [facility] did not do much for him   5. Did you understand the AVS information?  Yes   6. Did you get verbal instructions in your preferred language? Yes   If Yes- Was Dorothyann Gibbs used? No  (Pt is English speaking)   7. Did you follow the instruction on the AVS? Yes   8. Was HH arranged upon discharge during your last hospital visit?  Yes   If Yes- Did you receive the scheduled HH visit? Yes   9. Did you have enough help/support at home?  Yes   10. Did you get your discharge medications? Yes   11. Did you take your medications as prescribed/instructed? Yes   12. Did you see your PCP or Specialist before coming back to the hospital? Yes   13. Did you receive a 48hr post discharge phone call?  No   14. What do you (CM) think is the top reason for readmission? Pt originally admitted to [facility] for dislodged nephrostomy tubes, now re-admitted for failure to thrive related to cancer/chemo   Do You Have Transportation Issues/Concerns That Make It Difficult To Get To Your Appointments?  No   Recent Hospitalizations (Within Last 6 Months) * Yes   High Risk For Readmission * Yes   High Risk Indicators Chronic illness   Action Taken To Prevent Readmission  After Discharge * Home health referral   Recommendations to the Physician* Home health orders   Patient Information   Where was the patient admitted from? * Home   Address on Facesheet correct?* Yes   Patient contact phone number on Facesheet correct? Yes   PCP listed on Facesheet correct? Yes   Prior to Level of Function * Ambulatory;Independent with ADL's   Assistive Device * Walker;Cane   Home Care Services * Yes   Type of Home Care Services on Admission Home PT;Nurse visit   Home Service Name,  Address and Phone Number on Admission [facility]   Additional Services on Admission [facility]   Available Assistance/Support System  and Prior Community Resources Family member(s)   Primary Caretaker(s) * Self;Family   Primary Family/Caregiver Contact Name, Number and Relationship * Colman Cater, sister, [phone]   Permission to Contact * Yes   Secondary Contact Name, Number and Relationship N/A   Conservator/Public Guardian Name and Contact Info N/A   Social Worker Consult   Do you need to see a Child psychotherapist? * No   Income Information   Income Source Geneticist, molecular   Discharge Planning   Living  Arrangements on Admission* Family Member  (Pt lives with his sister and neice)   Type of Residence * One Story Home   Stairs/Steps to home  Yes   Number of Stairs/Steps 1 smalls step   Patient's Discharge Goal(s) Home;Home Health   Anticipated Discharge Disposition/Needs Home with Family;HH RN;Home PT/OT   Anticipated DischargeTransportation *  Family/Friend   Anticipated Discharge Transportation Details Sister to provide transport home   Barriers to Discharge * None   Patient Engaged in Discharge Planning * Yes   PATIENT CHOICE: Patient/Family/Legal/Surrogate Decision Maker Has Been Given a List Options And Choice In The Selection of Post-Acute Care Providers Yes   Patient's Top 3 Discharge Facility Choices  [facility]   Family/Caregiver's Assessed for  * Readiness, willingness, and ability to provide or support self-management activities   Respite Care * Not Applicable   Patient/Family/Other Are In Agreement With Discharge Plan * To be determined   Public Health Clearance Needed * Not Applicable     Medical Necessity:    LOS at time of Initial Assessment: 2 Days 6 Hours  Pt admitted on 03/21/2023 11:06 AM    LACE+ Score: 3571    Address verified as discharge address:   [address]  [place] [phone] (home)    PCP verified:  Renelda Lomaalzada, Heather Ann  [address] / [place] [id number]  telephone [phone]  fax [phone]    Pharmacy:  [facility] [phone] - [place], [place] - [address]    PLOF:      Hx of SNF placement:      Hx of Home health services:     - Use previous home health agency upon discharge (yes/no):     DME (includes ALL home equipment):     Hx of HD/PD:  Maintenance Dialysis History    Patient has no recorded history of maintenance dialysis.     Clinic:  Address:   Phone:  Chair schedule:  Transport:  Verified by:     DISCHARGE PLANNING    Support system:      Anticipated DC disposition (home, SNF, etc) :      Anticipated DC needs (HH, DME, none, etc):      Anticipated barriers to discharge:       Transportation:             Expected discharge date:         Wilfred Lacyebecca A Eddrick Dilone, RN

## 2023-03-23 NOTE — Plan of Care (Signed)
Problem: Promotion of Health and Safety  Goal: Promotion of Health and Safety  Description: The patient remains safe, receives appropriate treatment and achieves optimal outcomes (physically, psychosocially, and spiritually) within the limitations of the disease process by discharge.    Information below is the current care plan.  Outcome: Progressing  Flowsheets  Taken 03/23/2023 1324  Guidelines: Inpatient Nursing Guidelines  Individualized Interventions/Recommendations #1: Patient with nausea prior to eating. Attempting to pre-medicate with anti-emetics prior to eating to increase PO intake. Patient with decent fluid intake, continuing to encourage intake as tolerated.  Individualized Interventions/Recommendations #2 (if applicable): Patient with lower back pain and joint pain. Oxycodone given this morning, declining further doses at this time. Voltaren gel ordered from pharmacy, per patient to use for R knee.  Individualized Interventions/Recommendations #3 (if applicable): Patient having difficulty accessing MyChart. Assisted patient to reset MyChart and reaccess application. Patient able to reaccess his chart.  Outcome Evaluation (rationale for progressing/not progressing) every shift: Patient still with significant joint pain/back pain. Able to ambulate short distances in hallway, using walker for further distances. Able to bring self to bathroom independently. Emptying nephrostomy independently. Plan for possible discharge tomorrow pending speech evaluation and improvement in PO intake.  Taken 03/23/2023 0839  Patient /Family stated Goal: Speak with oncology/urology MD for rescheduling of chemo  Note:

## 2023-03-23 NOTE — Interdisciplinary (Signed)
Speech Language Pathology Swallow Evaluation       Preferred Language:English  Speech-Language Pathology    Swallow Treatment   Assessment   Patient presents with suspected functional oropharyngeal swallow c/b adequate oral containment/clearance and no overt s/sx of aspiration across PO trials. Patient remains on room-air with clear CXR suggesting candidacy to continue current diet orders with adherence to swallow precautions (outlined below). Patient reports intermittent pharyngeal stasis sensation vs ?globus sensation (as sensation present even w/o PO intake). 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 However, patient with consistent swallow trigger palpated in the context of PO trials. Given patient's clinical complaints and desire for further work-up, he may benefit from an OP VFSS to further assess oropharyngeal swallow function. No further IP needs indicated at this time. Please re-consult if changes arise.     This occurs in the setting of 72 year old male with a history of metastatic urothelial carcinoma (bone) s/p L nephrostomy and ureteral stent placement s/p chemoradiation, most recently on EV/pembrolizumab (last given 03/03/23), CKD, gout, BPH s/p TURP, insomnia, and recent admission to [facility] (3/25-3/26) for a dislodged nephrostomy tube who presented w/ a constellation of symptoms including nausea, poor PO intake, weight loss, constipation, and generalized weakness and has been admitted for failure to thrive.    Recommendations     Diet: regular IDDSI-7 foods and thin IDDSI-0 drinks  Patient to self-select soft, moist food options from menu  Sit fully upright  Small sips & bites  Straws OK  Medication administration: as tolerated  Oral hygiene: brush gums and tongue  Discharge:   [facility] [phone]) for swallow therapy order code: [id number]     Please contact SLP on-call  pager with questions or concerns:  [place] [phone]  [place] [phone]     Objective - Additional Information   Patient recieved upright in bed; on room air. Reported "not being able to swallow" and endorsed pharyngeal stasis sensation even at current baseline w/o PO intake. Patient denied any overt s/sx of aspiration with solids/liquid intake. OME revealed edentulism and xerostomia . Pt currently selecting softer foods off menu to manage for edentulism.  Denied any recent PNAs. Endorsed significant recent weight loss (reports ~40lbs). Declined any changes in medications.     PO trials- feeding assist provided by patient:   Thin liquids (8oz via single cup sips and consecutive straw sips), puree solids (4oz), declined advanced solids d/t edentulism and ill-fitting dentures: Adequate oral containment & clearence. No overt s/sx of aspiration and vocal quality remained clear. Following PO trials, patient reporting that he "cannot swallow" in the context of dry swallow/swallowing salivia, however as soon as patient recieved PO solids/liquids he was able to initiate swallow trigger per palpation. Suspect xerostomia contribiting to inability to dry swallow.    Safe swallow strategies discussed with option for OP VFSS if swallow concerns persist, pt verbalized understanding andendorsed desire for ongoing assessment. Education regarding general aspiration precautions provided. MD/RN updated.          SP Pt Hist       Row Name 03/23/23 1500          Start of Service    Start of Care 03/23/23     Onset date (this episode) 03/21/23     Reason for referral Swallow Impairment     Type of evaluation 92610 Evaluation of oral & pharyngeal swallowing function  Row Name 03/23/23 1500          Patient History     Patient history 72 year old male with a history of metastatic urothelial carcinoma (bone) s/p L nephrostomy and ureteral stent placement s/p chemoradiation, most recently on EV/pembrolizumab (last given  03/03/23), CKD, gout, BPH s/p TURP, insomnia, and recent admission to [facility] (3/25-3/26) for a dislodged nephrostomy tube who presented w/ a constellation of symptoms including nausea, poor PO intake, weight loss, constipation, and generalized weakness and has been admitted for failure to thrive.                      SP Gen Assess       Row Name 03/23/23 1500          Additional Medical Data    Equipment Present None     Respiratory Status  Room air     Level of alertness Alert;Attentive     Date of Chest X-Ray 03/21/23     Chest X-Ray --  No concerns for PNA       Row Name 03/23/23 1500          Functional Oral Intake Scale     Total Oral Intake (Levels 4-7)  6 Total oral intake with no special preparation, but must avoid specific foods or liquid items                    SP Oral Motor Eval       Row Name 03/23/23 1600          Oral Motor Evaluation    Dentition Within normal limits     Buccal cavity Normal     Mucosal Quality  Other (Comment);Dry  Xerostomia     Hard palate  Normal     Vocal Quality  Good       Row Name 03/23/23 1600          CN V-Trigeminal    Tone/Mass of [place] and Temporalis Normal     Jaw movement Normal     Protrusion/Retraction of Mandible Normal       Row Name 03/23/23 1600          CN VII-Facial    Facial Muscles Intact bilaterally     Forehead Normal     Eyelids Normal     Lips Normal       Row Name 03/23/23 1600          CN IX-X Vagus-Glossopharyngeal    Velum at Rest Symmetric     Soft palate Elevates symmetrically     Velar Movement with /a/ Elevates symmetrically     Gag Reflex Unable to evaluate/assess       Row Name 03/23/23 1600          CN XII Hypoglossal    Tongue Protrusion/Retraction Normal     Tongue Lateralization --  reduced     Tongue movement Normal protrusion                    SP Bedside Swallow       Row Name 03/23/23 1500          Clinical Swallow Evaluation    Bedside Swallow Eval Source Other (Comment)  per MD consult     Dysphagia Symptoms  Xerostomia;Globus  sensation;Hold up of food in throat;Unintentional weight loss;Other( comments)  difficulties initiating a dry swallow     Current diet PO regular solids;Thin  liquids     Problems Weight loss from dysphagia;Other (comments)  Xerstomia likely impacting difficulties to initiation druy swallow. No overt s/sx of aspiration, suspect functional oropharyngeal swallow     Clinical Exam Recommendations Other (comments)  Consider OP VFSS, tx not indicated during IP admission                                  SP Plan       Row Name 03/23/23 1500          Outcome Measure    Assessment Tool FOIS 6/7, IDDSI-FDS 8/8     Score see above       Row Name 03/23/23 1500          Treatment Assessment     Speech treatment diagnosis  Dysphagia Unspecified     Assessment  see above     Status Cleared by RN for session;Cleared by physician for session       Row Name 03/23/23 1500          Patient Education    Do you have any questions Yes --all questions asked and answered     How do you best learn Verbal instruction;Demonstration     Include in my healthcare Myself;healthcare team     Patient/Family teaching Completed this visit       Row Name 03/23/23 1500          Treatment Today    92526  Tx of swallow dysfunction and/or oral function for feeding Airway protection strategies;PO trials;Patient/family education       Row Name 03/23/23 1500          Treatment Plan    Treatment plan No treatment indicated/within functional limits     Duration of treatment (in weeks) One time only, further treatment not indicated     Recommendations Continue current diet;Aspiration precautions;Thin liquids;Regular solids       Row Name 03/23/23 1500          Additional Recommendations    Discussion and Agreement Patient;MD/RN     Agreement to Plan of Care Patient stated understanding and agreement;No family/caregiver present     Family present for treatment None present     Interdisciplinary Communication  Discussed with RN;Discussed with Physician       Row  Name 03/23/23 1500          SLP Rehab Services DC Checklist    Does pt. require additional therapy session prior to DC? No

## 2023-03-23 NOTE — Progress Notes (Signed)
Hospital Medicine Daily Progress Note    SUBJECTIVE AND INTERVAL EVENTS:     NAEON    Patient is doing okay. He finally had a bowel movement. He understands that [facility] and will have to reschedule his infusion as they are unable to have him come over just for the infusion while as an inpatient in the hospital. He reports this morning that he sometimes has difficulty swallowing and has a lot of bone pain. His R knee in particular has bothered him more recently.     Review of systems: no fevers or chills    OBJECTIVE:  BP  Min: 99/57  Max: 123/57  Temp  Min: 97.6 F (36.4 C)  Max: 98.8 F (37.1 C)  Pulse  Min: 52  Max: 70  Resp  Min: 16  Max: 20  SpO2  Min: 95 %  Max: 99 %    BP (!) 123/57 (BP Location: Left arm, BP Patient Position: Sitting)   Pulse 56   Temp 98.5 F (36.9 C)   Resp 16   Ht 5\' 6"  (1.676 m)   Wt 71.4 kg (157 lb 8 oz)   SpO2 99%   BMI 25.42 kg/m     Physical Examination:    General: resting comfortably in bed  HEENT:  moist mucous membranes  Cardiovascular: No JVD. Regular rate and rhythm. Normal S1,S2. No murmurs appreciated. No rubs or gallops appreciated. Extremities warm and well-perfused. No lower extremity edema.  Respiratory: Good air entry bilaterally. No prolonged expiratory phase. No wheezes appreciated. No crackles appreciated.  Gastrointestinal: + bowel sounds. Soft, non-distended. No tenderness to palpation.  GU: L nephrostomy tube draining clear, yellow urine  Neurologic: moving all four extremities, no tremors appreciated   Skin: no lesions or rashes    Pertinent laboratory, imaging, and procedure data: recent results reviewed in chart.    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     #Severe protein calorie malnutrition  #Unintentional weight loss  #Poor PO intake  #Dysphagia  #Generalized weakness  #Abdominal pain  #Constipation- improving  - SLP consult  - Antiemetics              - Zofran first line              - Compazine second line  - Bowel reigmen: Miralax and Senna BID  - Nutrition consulted, appreciate recommendations  - PT/OT consulted, appreciate recs  --- Needs HH OT     #  L hip pain  #Chronic back pain  #Cancer pain  - Continue current regimen; palliative care consulted and appreciate recs  - Tylenol 975 mg Q8H PRN  - Oxycodone 5 mg q4h PRN  - Lidocaine patches PRN     #Metastatic urothelial carcinoma s/p  #L nephrostomy and ureteral stent placement: See note from [place], [place] on 3/13 for details of care. Most recently started on EV/pembrolizumab, last received 03/03/23. Nephrostomy exchanged most recently 03/14/23. Undergoing XRT to right shoulder.   - Dr. Roseanne Reno (primary Onc); unable to message in Epic Chat     #CKD: Kidney function at baseline.   - Avoid nephrotoxic agents  - Renally dose all medications     #Normocytic anemia  - Daily CBC     #Neuropathic pain  -Continue home pregabalin 100 mg nightly     #Gout  - Continue home allopurinol 100 mg daily     #BPH s/p TURP  - Continue home tamsulosin 0.4 mg daily     #Diet:  Regular diet  #Bowel regimen: Senna and miralax BID PRN  #VTE prophylaxis: Heparin  #Proxy decision maker: Dot Lanes, [phone]  #Code status: Full code     Disposition Planning 03/23/2023:  Days to Complete Medical Plan of Care Requiring Ongoing Hospitalization: Anticipated Tomorrow; Care Needs: Tolerating Diet                 (Discharge Planning and EDD Info)  Other remaining discharge needs: home health PT/OT   Discharge location: Home with home health  Outpatient Follow-up Needed: Oncology        Verne Grain, MD  Family Medicine PGY-3  [facility]

## 2023-03-23 NOTE — Interdisciplinary (Signed)
Spiritual Care Note    Date of Spiritual Care Visit: 03/23/23  Referred By: Rounding;Palliative Care    Spiritual Screening Provided: No  Date of Spiritual Screening: No data recorded    Assessment: Pt was alert and welcoming of visit. He expressed some frustration at missing a chemotherapy appointment due to his present admission, but understanding. He expressed gratitude for being in the right place to address his current concerns. He said he did not have any Spiritual Care needs at this time, but that he was glad to know of the availability of support.    Interventions: [facility], Discuss spiritual resources/coping, Emotional support, Listening, Chaplain oriented Pt to the availability of Spiritual Care support and how to request a visit.    Outcomes: Gratitude  Will Spiritual Care provide a Follow-Up Visit? No  Follow-Up Details: Chaplain communicated ongoing availability of Spiritual Care for support, and will continue follow-up.    Chaplain Armandina Gemma, D.Min., [facility]    To request Spiritual Care services, please place an order for "IP Consult to Spiritual Care".  For urgent/STAT requests, please also page [phone].

## 2023-03-24 ENCOUNTER — Telehealth (HOSPITAL_BASED_OUTPATIENT_CLINIC_OR_DEPARTMENT_OTHER): Payer: Self-pay | Admitting: Hematology & Oncology

## 2023-03-24 ENCOUNTER — Encounter (HOSPITAL_COMMUNITY): Payer: Self-pay

## 2023-03-24 ENCOUNTER — Other Ambulatory Visit: Payer: Self-pay

## 2023-03-24 DIAGNOSIS — M25561 Pain in right knee: Secondary | ICD-10-CM

## 2023-03-24 LAB — C-REACTIVE PROTEIN, BLOOD: CRP: 4.08 mg/dL — ABNORMAL HIGH (ref <0.5)

## 2023-03-24 LAB — SED RATE, BLOOD: Sed Rate: 32 mm/hr (ref 2–37)

## 2023-03-24 LAB — BASIC METABOLIC PANEL, BLOOD
Anion Gap: 9 mmol/L (ref 7–15)
BUN: 14 mg/dL (ref 8–23)
Bicarbonate: 31 mmol/L — ABNORMAL HIGH (ref 22–29)
Calcium: 9.1 mg/dL (ref 8.5–10.6)
Chloride: 102 mmol/L (ref 98–107)
Creatinine: 1 mg/dL (ref 0.67–1.17)
Glucose: 112 mg/dL — ABNORMAL HIGH (ref 70–99)
Potassium: 3.8 mmol/L (ref 3.5–5.1)
Sodium: 142 mmol/L (ref 136–145)
eGFR Based on CKD-EPI 2021 Equation: >60 mL/min/{1.73_m2}

## 2023-03-24 LAB — CBC WITH DIFF, BLOOD
ANC-Automated: 1.2 10*3/uL — ABNORMAL LOW (ref 1.6–7.0)
Abs Basophils: 0 10*3/uL (ref <0.2)
Abs Eosinophils: 0.2 10*3/uL (ref 0.0–0.5)
Abs Lymphs: 1.3 10*3/uL (ref 0.8–3.1)
Abs Monos: 0.5 10*3/uL (ref 0.2–0.8)
Basophils: 1 %
Eosinophils: 7 %
Hct: 25.9 % — ABNORMAL LOW (ref 40.0–50.0)
Hgb: 8.6 gm/dL — ABNORMAL LOW (ref 13.7–17.5)
Lymphocytes: 40 %
MCH: 28 pg (ref 26.0–32.0)
MCHC: 33.2 g/dL (ref 32.0–36.0)
MCV: 84.4 um3 (ref 79.0–95.0)
MPV: 10.5 fL (ref 9.4–12.4)
Monocytes: 14 %
Plt Count: 220 10*3/uL (ref 140–370)
RBC: 3.07 10*6/uL — ABNORMAL LOW (ref 4.60–6.10)
RDW: 17 % — ABNORMAL HIGH (ref 12.0–14.0)
Segs: 37 %
WBC: 3.3 10*3/uL — ABNORMAL LOW (ref 4.0–10.0)

## 2023-03-24 LAB — MAGNESIUM, BLOOD: Magnesium: 1.8 mg/dL (ref 1.6–2.4)

## 2023-03-24 LAB — PHOSPHORUS, BLOOD: Phosphorous: 4.2 mg/dL (ref 2.7–4.5)

## 2023-03-24 MED ORDER — POLYETHYLENE GLYCOL 3350 OR PACK
17.00 g | PACK | Freq: Two times a day (BID) | ORAL | 0 refills | Status: AC | PRN
Start: 2023-03-24 — End: ?

## 2023-03-24 MED ORDER — TRAZODONE HCL 50 MG OR TABS
50.00 mg | ORAL_TABLET | Freq: Every evening | ORAL | 0 refills | Status: DC
Start: 2023-03-24 — End: 2023-04-10

## 2023-03-24 MED ORDER — SENNA 8.6 MG OR TABS
17.2000 mg | ORAL_TABLET | Freq: Two times a day (BID) | ORAL | 0 refills | Status: DC
Start: 2023-03-24 — End: 2023-10-09

## 2023-03-24 MED ORDER — DICLOFENAC SODIUM 1 % EX GEL
2.0000 g | Freq: Four times a day (QID) | CUTANEOUS | 0 refills | Status: DC | PRN
Start: 2023-03-24 — End: 2023-11-23
  Filled 2023-03-24: qty 1, fill #0
  Filled 2023-06-01: qty 100, 13d supply, fill #0

## 2023-03-24 MED ORDER — HEPARIN SODIUM LOCK FLUSH 100 UNIT/ML IJ SOLN CUSTOM
500.0000 [IU] | Freq: Once | INTRAVENOUS | Status: AC | PRN
Start: 2023-03-24 — End: 2023-03-24
  Administered 2023-03-24: 500 [IU]
  Filled 2023-03-24: qty 5

## 2023-03-24 MED ORDER — LIDOCAINE 4 % EX PTCH
1.0000 | MEDICATED_PATCH | CUTANEOUS | 0 refills | Status: DC
Start: 2023-03-24 — End: 2023-05-11
  Filled 2023-03-24: qty 10, 10d supply, fill #0

## 2023-03-24 NOTE — Interdisciplinary (Signed)
Patient has discharge orders in place. After visit summary reviewed with patient at bedside, all questions and concerns addressed. Port flushed with heparin, de-accessed prior to discharge, gauze dressing clean/dry/intact. Patient refused discharge medications from discharge pharmacy at this time. Left nephrostomy tube intact. Patient discharged with all personal belongings. Patient left without any distress/concerns.

## 2023-03-24 NOTE — Discharge Instructions (Signed)
Diagnosis and Reason for Admission    You were admitted to the hospital for the following reason(s):  weakness and poor appetite    Your full diagnosis list is located on this After Visit Summary in the Hospital Problems section.    What Happened During Your Hospital Stay    You were evaluated by Physical Therapy, Occupational Therapy, Palliative Care and Speech/Language Pathology. We increased your Miralax and Senna to twice per day dosing to get you to have a bowel movement. Physical Therapy worked with you on using the walker for moving around. We will add Occupational Therapy to your Home Health nursing regimen. Speech/Language wants to see you as an outpatient to further evaluate your swallowing but you are able to continue eating regular foods at this time. We kept your pain regimen the same with the help of Palliative Care and started Voltaren gel for your right knee pain. Please make a follow up appointment with your Oncologist (Dr. Roseanne Reno).    Instructions for After Discharge    Your diet at home should be a regular diet.    Your activity level at home should be:  regular activity.    Specific activity restrictions:    None    Wound or tube care instructions:  Resume previous wound care.    Your medication list is located on this After Visit Summary in the Current Discharge Medication List section.  Your nurse will review this information with you before you leave the hospital.    It is very important for you to keep a current medication list with you in order to assist your doctors with your medical care.  Bring this After Visit Summary with you to your follow up appointments.    Reasons to Contact a Doctor Urgently    Call 911 or return to the hospital immediately if:  you develop shortness of breath, chest pain, fall down or any other concerning symptom.    You should contact either your primary care physician or your hospital physician for any of the following reasons: acute illness    If you have any  questions about your hospital care, your medications, or if you have new or concerning symptoms soon after going home from the hospital, and you need to contact your hospital physician, your hospital physician can be contacted in the following manner:  [facility] operator at [phone].    Once you are able to see your primary care physician (PCP), your PCP will then be responsible for further medication refills, or appointment referrals.    What Needs to Happen Next After Discharge -- Appointments and Follow Up    Any appointments already scheduled at [place] clinics will be listed in the Future Appointments section at the top of this After Visit Summary.  Any appointments that have been requested, but have not yet been scheduled, will be listed below that under Post Discharge Referrals.    Sometimes tests performed in the hospital do not yet have results by the time a patient goes home.  The following key tests will need to be followed up at your next appointment: None    Medical Home Information    Your primary care provider or clinic currently on file at [facility] is: Elijah Birk, Davene Costain  You currently have an advance directive or living will on file at [facility]: Yes    Handouts Given to You (if applicable)

## 2023-03-24 NOTE — Discharge Summary (Signed)
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       Additional Hospital Diagnoses ("rule out" or "suspected" diagnoses, etc.):  Malnutrition Diagnosis (if any): Severe malnutrition     Malnutrition Designation: Severe     Unintentional weight loss  Nausea with vomiting  Right knee pain  Chronic back pain  Metastatic urothelial carcinoma with L nephrostomy and ureteral stent  Constipation  Dysphagia    Principal Procedure During This Hospitalization (required):  PT, OT, Nutrition and SLP evaluation    Other Procedures Performed During This Hospitalization (required):  None    Procedure results are available in Chart Review in Epic.  For those providers external to [facility], the key procedure results are listed below:  X-Ray Chest Single View    Result Date: 03/21/2023  IMPRESSION: No acute findings to explain the provided history except for new port currently being accessed, and possible skeletal metastases worsened or new since last chest x-ray May 5th of last year     Consultations Obtained During This Hospitalization:  Case Management  Nutrition Services  Occupational Therapy  Palliative Care Service  Physical Therapy  Speech Therapy    Key consultant recommendations:  Add HH OT to patient's current HH regimen; keep pain med regimen the same; outpatient swallow study and swallow training    Reason for Admission to the Hospital / History of Present Illness:  Stephen [name] is a 72 year old male with a 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      Reports he is staying with his sister who helps care for him but she is gone for the next few days at a funeral for her husband who recently passed away. He was recently admitted to [facility] from 3/25-3/26 and feels like he is declining overall. He reports losing 40 lb in about 1 month stating, "I just can't eat". He does have some nausea without vomiting but his appetite is very poor even outside of the nausea. He reports some upper/epigastric abdominal pain that is worsening recently and becoming more constant which he attributes to his stomach being empty. It is a dull pain, worse with palpation, and currently a 5/10. Not sure whether it is worse with eating/drinking because he doesn't eat/drink much. Since leaving the hospital he has been very constipated as well though did have a small hard black bowel movement a few days ago. Denies hematochezia. 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 He has been lightheaded more frequently lately but denies syncope though has had a couple "close calls". Overall, he feels extremely weak though it is not focal. Has not been able to walk much due to R hip/R knee pain though this is not new. In fact none of his symptoms are new, they have just been persistent and he does not feel  he can manage them well at home without support. Describes a few days of nasal congestion as well. Denies fever/chills. Lastly, he reports swelling in his legs and intermittent chest pain with slightly increased SOB. Chest pain is described as tightness. It is not present currently and the last time he felt it was a week or two ago. Typically occurs with deep breathing. He cannot lay flat due to back pain.    Hospital Course by Problem (required):  Weakness  Cancer Pain  Patient  was evaluated with PT, OT and palliative care. No changes to pain regimen as his pain was well controlled during the admission. PT worked with patient with FWW. OT recommended adding on OT with HH. Patient already had HH. On day of discharge, patient is ambulating with walker without issue and pain is well controlled. Patient will follow up with Oncology (Dr. Roseanne RenoStewart) as scheduled for urothelial cancer.    Poor PO intake  Unintentional weight loss  Nausea with vomiting  Constipation  Dysphagia  Senna and Miralax were optimized to twice daily dosing. Patient was able to tolerate a normal diet without any vomiting. SLP was consulted and did bedside swallow study which patient passed. 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 SLP recommended outpatient referral and potential video swallow study.    Tests Outstanding at Discharge Requiring Follow Up:  There are no tests or other items that require follow up after hospital discharge.      Discharge Condition (required):  Improved.    Key Physical Exam Findings at Discharge:  Mental Status Exam: Patient is alert and oriented to person, place, time, and situation.  No significant physical examination findings at the time of discharge.    Discharge Diet:  Regular.    Discharge Disposition:  Home with home care services:  home OT .    Discharge Code Status:  Full code / full care  This code status is not changed from the time of admission.    Discharge Medications:     What To Do With Your Medications        START taking these medications        Add'l Info   diclofenac 1 % gel  Commonly known as: VOLTAREN  Apply 2 g topically every 6 hours as needed (Right knee pain).   Quantity: 1 each  Refills: 0     lidocaine 4 % patch  Commonly known as: ASPERCREME  Apply 1 patch topically every 24 hours. Leave patch on for 12 hours, then remove for 12 hours.   Quantity: 30 patch  Refills: 0            CHANGE how you take these medications         Add'l Info   polyethylene glycol 17 g packet  Commonly known as: MIRALAX  Take 1 packet (17 g) by mouth every 12 hours.   Quantity: 1 each  Refills: 0  What changed:   when to take this  reasons to take this  additional instructions     senna 8.6 MG tablet  Commonly known as: SENOKOT  Take 2 tablets (17.2 mg) by mouth 2 times daily.   Quantity: 30 tablet  Refills: 0  What changed: when to take this            CONTINUE taking these medications        Add'l Info   Alaway 0.025 % ophthalmic solution  Place 1 drop  into both eyes 2 times daily.  Generic drug: ketotifen   Refills: 0     allopurinol 100 MG tablet  Commonly known as: ZYLOPRIM  Take 1 tablet (100 mg) by mouth daily.   Quantity: 30 tablet  Refills: 0     DULoxetine 20 MG CR capsule  Commonly known as: CYMBALTA  Take 1 capsule (20 mg) by mouth daily.   Quantity: 90 capsule  Refills: 3     multivitamin with minerals Tabs tablet  Take 1 tablet by mouth daily.   Quantity: 30 tablet  Refills: 1     naloxone 4 mg/0.1 mL nasal spray  Commonly known as: NARCAN  For suspected opioid overdose, call 911! Then spray once in one nostril. Repeat after 3 minutes if no or minimal response using a new spray in other nostril.   Quantity: 2 each  Refills: 0     Nepro Liqd  Take 3 Cans by mouth daily.   Quantity: 30000 mL  Refills: 11     nystatin 100,000 units/mL suspension  Commonly known as: MYCOSTATIN  Take 5 mL (500,000 Units) by mouth 4 times daily.   Quantity: 280 mL  Refills: 0     oxyCODONE 10 MG tablet  Commonly known as: ROXICODONE  Take 1.5 tablets to 2 tablets (15mg  to 20mg ) by mouth every 4 hours as needed for pain   Quantity: 90 tablet  Refills: 0     pregabalin 100 MG capsule  Commonly known as: LYRICA  Take 1 capsule (100 mg) by mouth at bedtime.   Quantity: 30 capsule  Refills: 2     prochlorperazine 10 MG tablet  Commonly known as: COMPAZINE  Take 1 tablet (10 mg) by mouth every 6 hours as needed (Nausea/Vomiting).   Quantity: 30 tablet  Refills: 5      tamsulosin 0.4 MG capsule  Commonly known as: FLOMAX  TAKE 1 CAPSULE BY MOUTH EVERY DAY   Quantity: 90 capsule  Refills: 1     traZODone 50 MG tablet  Commonly known as: DESYREL  Take 1 tablet (50 mg) by mouth at bedtime.   Quantity: 30 tablet  Refills: 0     triamcinolone 0.1 % cream  Commonly known as: KENALOG  Apply 1 Application. topically 2 times daily. Apply a thin layer as directed   Refills: 0            STOP taking these medications      docusate sodium 100 MG capsule  Commonly known as: COLACE            ASK your doctor about these medications        Add'l Info   acetaminophen 500 MG tablet  Commonly known as: TYLENOL  Take 2 tablets (1,000 mg) by mouth every 8 hours as needed for Mild Pain (Pain Score 1-3) or Moderate Pain (Pain Score 4-6).   Quantity: 40 tablet  Refills: 0               Where to Get Your Medications        These medications were sent to [facility]  [address], [place] [address]      Hours: Mon-Fri: 8:30am-7:00pm; Sat-Sun & Holidays: 9:00am-5:00pm Phone: [phone]   diclofenac 1 % gel  lidocaine 4 % patch         Allergies:  )  Allergies   Allergen Reactions    Sulfa Drugs Unspecified  Follow Up Appointments:    Already Scheduled:  Future Appointments   Date Time Provider Department Center   04/10/2023  2:00 PM Shea EvansEvans, Sarah Suzanne, MD [facility] Onc [facility]       PostDischarge Referrals and Orders       [facility]      Oncology Transitions of Care Discharge Follow Up (Established Oncology Patients Only)            Certain appointment types may not appear in this section. Refer to the Post Discharge Referrals section of the After Visit Summary for further information.    Discharging Physician's Contact Information:  [facility] Hospital Medicine phone triage at [phone].    Verne Grainlayton Loretta Doutt, MD  Family Medicine PGY-3  [facility]

## 2023-03-24 NOTE — Interdisciplinary (Signed)
03/24/23 1149   Discharge Plan   CM Discharge Arrangements Complete Yes   CM met with Pt to discuss pending discharge Yes   Discussed Following Post-Acute arranged services Home Health   Post Acute arrangements for Home Health (Pt/family informed of vendor and date/time for services) Yes   Does pt/family have additional discharge concerns No   Living Arrangements on Admission* Family Member   Patient Engaged in Discharge Planning * Yes   Family/Caregiver's Assessed for * Not Applicable   Respite Care * Not Applicable   Patient/Family/Other Are In Agreement With Discharge Plan * Yes   Verified phone number for DC location * Yes   Verified address for DC location * Yes   PATIENT CHOICE: Patient/Family/Legal/Surrogate Decision Maker Has Been Given a List Options And Choice In The Selection of Post-Acute Care Providers Yes   CM discussed the following with pt, and/or family, and/or DPOA [place] has agreements with select post-acute care providers in the collaborative care network   Public Health Clearance Needed * Not Applicable   If Medicare or Medicare Managed Care: Important Message from Medicare Given   If Medicare or Medicare Managed Care: Important Message from Medicare Given Not Applicable   Discharge Planning Needs   Do You Have Transportation Issues/Concerns That Make It Difficult To Get To Your Appointments?  No   Does this patient have CM discharge planning needs? Yes   CM Needs Met? Yes   Final Discharge Transportation   Transportation*  Family/Friend   Date 03/24/23   Final Discharge Destination/Services   Final Discharge Destination/Services * Home;Home Health   Home Health/Home Infusion   Home Health Care Agency * [facility]   Phone * Phone: [phone]     CM confirmed with patient dc plans and verbalized understanding.

## 2023-03-24 NOTE — Telephone Encounter (Signed)
Return call placed to Stephen [name].   Reviewed Stephen [name] will need to be seen by Dr. Roseanne Reno prior to resuming treatment with EV+pembro.   Reviewed will likely try and schedule for 4/18.   Stephen [name] v/u and aware will be contacted next week with details of f/u visit.

## 2023-03-24 NOTE — Plan of Care (Signed)
Problem: Promotion of Health and Safety  Goal: Promotion of Health and Safety  Description: The patient remains safe, receives appropriate treatment and achieves optimal outcomes (physically, psychosocially, and spiritually) within the limitations of the disease process by discharge.    Information below is the current care plan.  03/24/2023 1627 by Sharlette Dense, RN  Outcome: Resolved  03/24/2023 1414 by Sharlette Dense, RN  Outcome: Progressing  Flowsheets  Taken 03/24/2023 1413  Guidelines: Inpatient Nursing Guidelines  Individualized Interventions/Recommendations #1: Assess patient's pain and provide medications per MAR.  Individualized Interventions/Recommendations #2 (if applicable): Monitor left nephrostomy.  Individualized Interventions/Recommendations #3 (if applicable): Answer patient's call light requests.  Individualized Interventions/Recommendations #4 (if applicable): Continue bowel regimen.  Individualized Interventions/Recommendations #5 (if applicable): Cluster care to promote rest.  Outcome Evaluation (rationale for progressing/not progressing) every shift: Patient has discharge orders pending, awaiting ride at this time. Pain currently managed by medication regimen, respirations even and unlabored. Left nephrostomy intact, draining clear yellow output. Calm and cooeprative with care, ambulates multiple times around the unit with even/steady gait. Bowel regimen continued, no BM reported thus far. VSS, AOx4, afebrile, plan of care ongoing.  Taken 03/24/2023 0855  Patient /Family stated Goal: "control pain"  Note:

## 2023-03-24 NOTE — Plan of Care (Signed)
Problem: Promotion of Health and Safety  Goal: Promotion of Health and Safety  Description: The patient remains safe, receives appropriate treatment and achieves optimal outcomes (physically, psychosocially, and spiritually) within the limitations of the disease process by discharge.    Information below is the current care plan.  Outcome: Progressing  Flowsheets (Taken 03/24/2023 0325)  Guidelines: Inpatient Nursing Guidelines  Outcome Evaluation (rationale for progressing/not progressing) every shift: Patient remains stable thus far this shift. VSS. Pain well controlled with current pain regimen. Instructed to call when in need of assistance. Pt amenable to current plan of care.  Note:

## 2023-03-24 NOTE — Telephone Encounter (Signed)
Patient called to request advice on when to reschedule PADCEV  infusion. Patient will be discharge today 04/05 and like to know if he is good to reschedule. Please call for details.     Thank you for your time.    Abby M.  Infusion scheduler

## 2023-03-24 NOTE — Plan of Care (Signed)
Problem: Promotion of Health and Safety  Goal: Promotion of Health and Safety  Description: The patient remains safe, receives appropriate treatment and achieves optimal outcomes (physically, psychosocially, and spiritually) within the limitations of the disease process by discharge.    Information below is the current care plan.  Outcome: Progressing  Flowsheets  Taken 03/24/2023 1413  Guidelines: Inpatient Nursing Guidelines  Individualized Interventions/Recommendations #1: Assess patient's pain and provide medications per MAR.  Individualized Interventions/Recommendations #2 (if applicable): Monitor left nephrostomy.  Individualized Interventions/Recommendations #3 (if applicable): Answer patient's call light requests.  Individualized Interventions/Recommendations #4 (if applicable): Continue bowel regimen.  Individualized Interventions/Recommendations #5 (if applicable): Cluster care to promote rest.  Outcome Evaluation (rationale for progressing/not progressing) every shift: Patient has discharge orders pending, awaiting ride at this time. Pain currently managed by medication regimen, respirations even and unlabored. Left nephrostomy intact, draining clear yellow output. Calm and cooeprative with care, ambulates multiple times around the unit with even/steady gait. Bowel regimen continued, no BM reported thus far. VSS, AOx4, afebrile, plan of care ongoing.  Taken 03/24/2023 0855  Patient /Family stated Goal: "control pain"  Note:

## 2023-03-25 NOTE — Progress Notes (Signed)
I have evaluated the patient today; he will be discharged from the hospital today.     Today, his physical examination is notable for normal exam, with moderately poor muscle bulk and tone.    Please refer to the Discharge Summary for further details.    I spent 25 minutes on the patient's care unit in the preparation and execution of the hospital discharge.    Paulette Blanch [facility]

## 2023-03-26 LAB — BLOOD CULTURE
Blood Culture Result: NO GROWTH
Blood Culture Result: NO GROWTH

## 2023-03-27 ENCOUNTER — Telehealth (HOSPITAL_BASED_OUTPATIENT_CLINIC_OR_DEPARTMENT_OTHER): Payer: Self-pay | Admitting: Hematology & Oncology

## 2023-03-27 ENCOUNTER — Telehealth (HOSPITAL_BASED_OUTPATIENT_CLINIC_OR_DEPARTMENT_OTHER): Payer: Self-pay

## 2023-03-27 NOTE — Telephone Encounter (Signed)
Discharge Follow Up Telephone Encounter:     Subject/Reason: Post-discharge follow up call     Primary Oncologist: Dr. Roseanne Reno  PA: Franco Nones    RN: Elsworth Soho     Diagnosis: Metastatic urothelial carcinoma      Discharged from: Hospital admission     Discharge diagnosis:  Weakness, poor PO intake      Date of discharge: 03/24/23  Note: Call to the patient to follow up on recent ED/hospital admission, review discharge instructions, and discuss any patient concerns. No answer. Left a message requesting a call back and provided my direct phone number. Will attempt a second call tomorrow.     Discharge Follow up::   Pt is scheduled to follow up with Dr. Roseanne Reno on 04/03/23.    Routing to [employer] team as FYI.

## 2023-03-27 NOTE — Telephone Encounter (Signed)
Pt returning call received to confirm appt. made 4/15 at 9 a.m. Informed pt about appt. Pt stated will check MyChart. FYI.

## 2023-03-27 NOTE — Telephone Encounter (Signed)
RN called pt to offer a RV with Dr. Roseanne Reno on 4/15.  No answer so RN LVM with her callback number.

## 2023-03-27 NOTE — Telephone Encounter (Signed)
Called the patient to offer a return visit on 4/15 @ 9:00 am with Dr. Roseanne Reno. The call went to voice mail. Left a message asking for a confirmation call back at [phone]

## 2023-03-28 ENCOUNTER — Telehealth (HOSPITAL_BASED_OUTPATIENT_CLINIC_OR_DEPARTMENT_OTHER): Payer: Self-pay

## 2023-03-28 NOTE — Telephone Encounter (Signed)
Patient called attempting to schedule for a Video Swallow Study. Referral is on file for ST Dx:     metastatic urothelial cancer with dysphagia     However additional order is needed for radiology.     Sending msg to request if additional order may be placed for radiology:  [id number]-Video Swallow Imaging (X-ray Swallowing Function)     (Modified Barium)

## 2023-03-28 NOTE — Telephone Encounter (Signed)
Second call to the patient to follow up on recent hospital admission, review discharge instructions, and discuss any patient concerns. No answer. Left a message requesting a call back and provided my direct phone number.

## 2023-03-29 ENCOUNTER — Other Ambulatory Visit (HOSPITAL_BASED_OUTPATIENT_CLINIC_OR_DEPARTMENT_OTHER): Payer: Self-pay | Admitting: Physician Assistant

## 2023-03-29 ENCOUNTER — Telehealth (HOSPITAL_BASED_OUTPATIENT_CLINIC_OR_DEPARTMENT_OTHER): Payer: Self-pay

## 2023-03-29 ENCOUNTER — Other Ambulatory Visit (HOSPITAL_BASED_OUTPATIENT_CLINIC_OR_DEPARTMENT_OTHER): Payer: Self-pay | Admitting: Nurse Practitioner

## 2023-03-29 ENCOUNTER — Encounter (HOSPITAL_BASED_OUTPATIENT_CLINIC_OR_DEPARTMENT_OTHER): Payer: Self-pay | Admitting: Hospitalist

## 2023-03-29 DIAGNOSIS — C7910 Secondary malignant neoplasm of unspecified urinary organs: Secondary | ICD-10-CM

## 2023-03-29 DIAGNOSIS — Z981 Arthrodesis status: Secondary | ICD-10-CM

## 2023-03-29 DIAGNOSIS — R1310 Dysphagia, unspecified: Secondary | ICD-10-CM

## 2023-03-29 DIAGNOSIS — G893 Neoplasm related pain (acute) (chronic): Secondary | ICD-10-CM

## 2023-03-29 MED ORDER — PREGABALIN 100 MG OR CAPS
100.0000 mg | ORAL_CAPSULE | Freq: Every evening | ORAL | 2 refills | Status: DC
Start: 2023-03-29 — End: 2023-05-29

## 2023-03-29 NOTE — Telephone Encounter (Signed)
Routed to [place] NP - does pt need to get refill from PCP ?     LOV: 05/26/2022   NOV: none  LRD: 11/28/2022 Lyrica 100 mg at bedtime (30 capsules) (2 refills ordered)

## 2023-03-29 NOTE — Telephone Encounter (Signed)
Pt calling in stating he was just discharged from the hospital and needs speech therapy. Order on file is for a VSS missing the xray portion. Messaged referring MD with no response. Please review if Xray order is needed.       Please assist

## 2023-03-31 MED ORDER — OXYCODONE HCL 10 MG OR TABS
ORAL_TABLET | ORAL | 0 refills | Status: DC
Start: 2023-03-31 — End: 2023-04-16

## 2023-03-31 NOTE — Telephone Encounter (Signed)
Rx signed.

## 2023-04-03 ENCOUNTER — Ambulatory Visit: Payer: Medicare Other | Attending: Hospitalist | Admitting: Hematology & Oncology

## 2023-04-03 ENCOUNTER — Encounter (HOSPITAL_BASED_OUTPATIENT_CLINIC_OR_DEPARTMENT_OTHER): Payer: Self-pay | Admitting: Hematology & Oncology

## 2023-04-03 VITALS — BP 100/59 | Temp 96.3°F | Resp 16 | Ht 66.0 in | Wt 157.0 lb

## 2023-04-03 DIAGNOSIS — C7910 Secondary malignant neoplasm of unspecified urinary organs: Secondary | ICD-10-CM | POA: Insufficient documentation

## 2023-04-03 DIAGNOSIS — C7949 Secondary malignant neoplasm of other parts of nervous system: Secondary | ICD-10-CM | POA: Insufficient documentation

## 2023-04-03 DIAGNOSIS — M549 Dorsalgia, unspecified: Secondary | ICD-10-CM | POA: Insufficient documentation

## 2023-04-03 DIAGNOSIS — C642 Malignant neoplasm of left kidney, except renal pelvis: Secondary | ICD-10-CM | POA: Insufficient documentation

## 2023-04-03 NOTE — Interdisciplinary (Signed)
LEFT PCN insertion site assessed.  It is healing well.  Insertion site cleansed with alcohol pad, dried, and left open to air.    LEFT PCN bag was changed.  Pt was provided with extra bags and supplies at pt visit.  PCN education form reviewed and given to pt upon discharge.

## 2023-04-03 NOTE — Progress Notes (Signed)
Name: Stephen [name]   Date of Birth: 1951-05-05  Medical Record Number: [id number]    Genitourinary Medical Oncology Clinic     Patient ID: likely metastatic urothelial carcinoma    Stage: Cancer Staging   Ureter malignant neoplasm, left (CMS-HCC)  Staging form: Renal Pelvis And Ureter, AJCC 8th Edition  - Clinical: Stage IV (cTX, cN0, pM1) - Signed by Cheri Guppy, MD on 12/01/2022    Oncologic History:    04/22/21  CTU: filling defect in L renal pelvis  05/05/21  Cysto with L ureteroscopy revealed a L papillary tumor, biopsied: mixed high (30%) and low grade urothelial carcinoma  06/16/21  Repeat cysto, papillary tumor: pathology low grade papillary [place]  09/xx/22  Completed induction with mitogel (6 cycles)  08/19/21  HGTa of the bladder, no muscle  08/26/21  Repeat TURBT: NED, muscle identified  10/26/21  MRU: mild thickening in the L renal pelvis  12/24/21  Cysto/L ureteroscopy: dysplasia in the bladder; no disease in L ureter  05/06/22  L PCN placed  06/01/22  TURBT and L ureteroscopy: NED, stricture in place, stent placed  09/29/22  TURBT and L ureteroscopy: NED  10/22/22  MRU: Mild diffuse thickening in the L ureter/renal pelvis. Multiple osseous lesions involving the bilateral iliac crest and right acetabulum concerning for osseous metastatic disease.   11/26/22  PET/CT: diffuse osseous FDG activity with discrete osseous lesions concerning for metastatic disease.  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  Also treated for septic shock, AKI (Cr 2.5) and acute liver injury (AST/ALT 2800/2100).   S/p exchange L PCN.   02/07/23  start XRT to R Shoulder  02/10/23  C2D1 EV (dose reduced to 1.0 mg/kg) + pembro  200 mg IV  03/03/23  C3D1 EV/pembro, missed D8  03/06/23  Cysto: NED.  Cytology NED.   03/14/23  Seen at [facility] ED for dislodged tube, replaced  04/02-05/24  Admitted with weakness, poor PO intake.     Interim History:  Here after his most recent hospitalization.  He can't remember why he went into the hospital.   He's now at home. He's not eating very well.  His teeth are having an issue. Dentures are not fitting. He's doing a supplement a couple times a day.  He takes occ oxycodone.    Arm is doing much better, but he's having more pain in his back.   No neuropathy in his feet, but thinks his knees are the major issue.   No blood in urine or in L PCN - L PCN is draining well.     Review of Systems:   A complete ROS was performed and is negative except as documented above.     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  Non smoker  No etoh  Medications:  Current Outpatient Medications on File Prior to Visit   Medication Sig Dispense Refill    acetaminophen (TYLENOL) 500 MG tablet Take 2 tablets (1,000 mg) by mouth every 8 hours as needed for Mild Pain (Pain Score 1-3) or Moderate Pain (Pain Score 4-6). 40 tablet 0    allopurinol (ZYLOPRIM) 100 MG tablet Take 1 tablet (100 mg) by mouth daily. 30 tablet 0    diclofenac (VOLTAREN) 1 % gel Apply 2 g topically every 6 hours as needed (Right knee pain). 1 each 0    DULoxetine (CYMBALTA) 20 MG CR capsule Take 1 capsule (20 mg) by mouth daily. 90 capsule 3    [DISCONTINUED] gabapentin (NEURONTIN) 100 MG capsule Take 1 capsule (100 mg) by mouth 3 times daily for 7 days. Start taking after your procedure is completed. 21 capsule 0    ketotifen (ALAWAY) 0.025 % ophthalmic solution Place 1  drop into both eyes 2 times daily.      lidocaine (ASPERCREME) 4 % patch Apply 1 patch topically every 24 hours. Leave patch on for 12 hours, then remove for 12 hours. 30 patch 0    Multiple Vitamins-Minerals (MULTIVITAMIN WITH MINERALS) TABS tablet Take 1 tablet by mouth daily. 30 tablet 1    naloxone (NARCAN) 4 mg/0.1 mL nasal spray For suspected opioid overdose, call 911! Then spray once in one nostril. Repeat after 3 minutes if no or minimal response using a new spray in other nostril. 2 each 0    Nutritional Supplements (NEPRO) LIQD Take 3 Cans by mouth daily. 30000 mL 11    nystatin (MYCOSTATIN) 100,000 units/mL suspension Take 5 mL (500,000 Units) by mouth 4 times daily. 280 mL 0    oxyCODONE (ROXICODONE) 10 MG tablet Take 1.5 tablets to 2 tablets (15mg  to 20mg ) by mouth every 4 hours as needed for pain 90 tablet 0    polyethylene glycol (MIRALAX) 17 g packet Take 1 packet (17 g) by mouth every 12 hours. 1 each 0    pregabalin (LYRICA) 100 MG capsule Take 1 capsule (100 mg) by mouth at bedtime. 30 capsule 2    prochlorperazine (COMPAZINE) 10 MG tablet Take 1 tablet (10 mg) by mouth every 6 hours as needed (Nausea/Vomiting). 30 tablet 5    senna (SENOKOT) 8.6 MG tablet Take 2 tablets (17.2 mg) by mouth 2 times daily. 30 tablet 0    tamsulosin (FLOMAX) 0.4 MG capsule TAKE 1 CAPSULE BY MOUTH EVERY DAY 90 capsule 1    traZODone (DESYREL) 50 MG tablet Take 1 tablet (50 mg) by mouth at bedtime. 30 tablet 0    triamcinolone (KENALOG) 0.1 % cream Apply 1 Application. topically 2 times daily. Apply a thin layer as directed       No current facility-administered medications on file prior to visit.     Physical Examination:   04/03/23  0818   BP: (!) 100/59   Temp: 96.3 F (35.7 C)   Resp: 16   SpO2: 98%     ECOG PS 1  General: Appears chronically ill, some tangetial conversation  HEENT: Moist mucous membranes without oral lesions.   Neck: No thyromegaly or lymphadenopathy.   Chest: Clear to ausculation bilaterally. No  wheezes, rales or rhonchi.    Cardiac: Regular rate and rhythm without any murmurs, rubs or gallops.   Abdomen: Abd soft, nontender without any hepatosplenomegaly.   Extremities: No clubbing, cyanosis, or edema.  Skin: No rashes or lesions.   Neurological: Awake, alert and oriented  x 3. Strength and sensation are grossly intact.   Psychologic: Appropriate mood and affect.    Labs: Following labs reviewed  Lab Results   Component Value Date    WBC 3.3 (L) 03/24/2023    RBC 3.07 (L) 03/24/2023    HGB 8.6 (L) 03/24/2023    HCT 25.9 (L) 03/24/2023    MCV 84.4 03/24/2023    MCHC 33.2 03/24/2023    RDW 17.0 (H) 03/24/2023    PLT 220 03/24/2023    MPV 10.5 03/24/2023      Lab Results   Component Value Date    BUN 14 03/24/2023    CREAT 1.00 03/24/2023    CL 102 03/24/2023    NA 142 03/24/2023    K 3.8 03/24/2023    [place] 9.1 03/24/2023    TBILI 0.57 03/21/2023    ALB 3.7 03/21/2023    TP 6.9 03/21/2023    AST 22 03/21/2023    ALK 115 03/21/2023    BICARB 31 (H) 03/24/2023    ALT 11 03/21/2023    GLU 112 (H) 03/24/2023                Assessment:  Abdelrahman Nair is a 72 year old male with OA, HTN, MDD with L UTUC with metastatic disease ([id number]), with osseous mets (Pet/CT with diffuse enhancement and discrete lesions). FGFR-TACC mutation.  Started on EV+Pembro.    I personally reviewed labs as above.  I personally reviewed imaging as above.    Here for follow up after recent hospitalization.  He seems to be doing ok, but I do have concerns:  1) new back pain  2) repeated hospitalization  We will repeat imaging with a PET/cT and MRI lumbar spine.  I did discuss whether he is best suited to be at home alone.  He thinks he is and thinks he's safe at home.   He would like to get started back up with treatment.  We will try to get him started this Thursday and I would like thim to be evaluated prior to his D8 treatment.   He seems to have only received 1/10 fx of XRT to R shoulder.  He needs to follow up with them.     Advanced  Urothelial Carcinoma, [id number]  - Proceed with C4  - Treatment plan:  --- Enfortumab vedotin 1.25 --> 1.0 mg/kg (dose reduced prior to C2) D1, D8 Q21d  --- Pembrolizumab 200 mg D1 Q21d  --- prn anti-nausea meds  --- prn antidiarrheal medications  --- prn topical skin care  --- ED precautions  - RTC prio to C4D8  - MRI lumbar spine  - Pet/CT  - consider adding zometa given significnt bone disease; needs vit D level checked and dental clearance - he's known to have significant dental issues so this needs to be cleared first  - nutrition referral    Cancer related pain  - follows with palliative care  - continue intermittent oxycodone.     R shoulder pain  - 2/2 metastatic lesion  - seen by ortho 1/22  - needs fllow up with rad onc to get XRT restarted  - seen by palliative care team (Dr. Margarita Mail) 2/05     L PCN, initially placed 04/2022  - tube exchange planned next exchange in 04/2023  - draining well, will monitor    This visit was spent addressing a complex, chronic illness that poses a threat to life or bodily function to this patient.  I independently reviewed the patient's history since prior visit, reviewed intervening labs and imaging, counseled the patient on the management of their chronic illness, and placed orders to be done prior to next visit.     Charlett Lango. Nicole Kindred, M.D.  Assistant Professor of [place] of [place], [place]  [address]  [place], [place] [address]

## 2023-04-03 NOTE — Patient Instructions (Addendum)
Patient Instructions:    -- Schedule your next follow up appointments  at the front desk on your way out, or by calling the Urology Scheduling line:  [phone].  1.)  Follow up with Franco Nones on 4/25 (chemo should be after this time)  2.)  Return visit with Dr. Roseanne Reno on 5/9    --Schedule below scans for first available:    1.)  PET Scan:  PET/CT or PET scheduler: [phone]  2.)  MRI Lumbar spine:   Call [phone] to schedule these exams.       --Re-start chemotherapy later this week.  Yesica Kemler sent a message to infusion, keep your phone on you to schedule      --Call Radiation Oncology to complete Radiation to right arm: [phone]     --Call and schedule an appointment with [facility] Nutrition:  [phone]     -- Dr. Roseanne Reno would like to start you on Denosumab, a bone strengthening agent.  You will need to set up an appointment to see your Dentist to be evaluated and make sure that all invasive dental procedures are completed prior to starting this medication.          Have questions about Advanced Care Planning?  PREPARE ([email or web address])       Future Appointments   Date Time Provider Department Center   04/10/2023  2:00 PM Shea Evans, MD [facility] Onc [facility]           *[facility] Website:  [email or web address]    Contact Information:   Brock Ra, MD   Assistant Professor of Medicine  Medical Oncology  Genitourinary Malignancy      Franco Nones, M.S., PA-C  Sr. Physician Assistant    Clinical questions or concerns  Oncology Nurse Case Manager   Elsworth Soho RN, BSN  T: [phone]  Department of Urology   [facility]  [address]  [place], [place] [address]    Dr. Roseanne Reno Follow Up Appointment Assistance  Administrative Assistant   Terence Lux  T: [phone]  F: [phone]    Monday-Friday 8:00- 4:30p.m. (Closed Holidays and Weekends)  Please listen to message  carefully for daily information.   Leave a detailed message with your name, medical record number and   telephone number. Messages are usually returned within 24 hours.     [facility] Phone List     AFTER HOURS EMERGENCY NUMBER   Ask for the Oncologist Physician On-Call.                   [phone]    [facility] Scheduling line: [phone]  Call for information about our new location and to schedule or cancel appointments.    [facility] Scheduling line: [phone]   Call to schedule your [name], CT Scans or MRI studies.    PET Scan Scheduling line: [phone]  Call to schedule your PET scan    Prostate MRI Scheduling line: [phone] or [phone]  Call to schedule your prostate MRI.   Performed at the [facility] ([facility]).    Nuclear Medicine Scheduling Line [phone]   Call to schedule your bone scan, Radium 223     [facility] Infusion Center: [phone]   Call to schedule, cancel, or reschedule chemotherapy appointments.  Infusion Center Hours--- Monday-Friday 7:00-7:30 p.m.; Sat-Sun 8:00-4:30pm.    [facility] ([address]): [phone]  Monday-Friday 730-530 p.m.;  Closed Weekends    Radiation Oncology: [phone]    Call to schedule, cancel, or reschedule radiation appointments    Social Worker  [place]: Agnes Lawrence, [place]    T: [phone]  [facility]: Omar Person, LCSW  T: [place]   [phone] at [facility]   [phone] or [phone]      [facility]  [address] [place] [address]    -You can also reach [name] by [place]. To set up your MyChart please see the following site: [email or web address]  -For cancer support services, please visit out Patient and [place] support services, please  visit:  [email or web address]  -For other support from the [place], please visit: [email or web address]              Thank you for allowing [name] to participate in your care today! We value your feedback as we strive to provide every patient with an exceptional care experience.     You may receive a patient satisfaction survey in the mail in the next few weeks. Please fill out the survey upon receipt and return it in the self-addressed envelope. Your feedback will be used to help improve the experience for all our patients at [facility].     If you want to share a positive experience you had at our facility please call We Listen at [phone]. If you have any suggestions on how we can improve our services please call the [facility] leadership team at [phone].    For medical questions or emergencies, please call your physician's office or 911. Thank you!

## 2023-04-05 ENCOUNTER — Telehealth (HOSPITAL_BASED_OUTPATIENT_CLINIC_OR_DEPARTMENT_OTHER): Payer: Self-pay

## 2023-04-05 ENCOUNTER — Telehealth (HOSPITAL_BASED_OUTPATIENT_CLINIC_OR_DEPARTMENT_OTHER): Payer: Self-pay | Admitting: Hematology & Oncology

## 2023-04-05 NOTE — Telephone Encounter (Signed)
Caller: pt  Relationship to patient: self  Phone # [phone]  Preferred Method of Communication: Call back    Notes: Pt requesting a call from [place], [place]. Needs to have a clearance letter for Dentist requests a call to discuss.

## 2023-04-05 NOTE — Telephone Encounter (Signed)
[facility] faxed notification PA required for oxyCODONE (ROXICODONE) 10 MG tablet Sig: Take 1.5 tablets to 2 tablets (  to ) by mouth every 4 hours as needed for pain.     Key: [id number]

## 2023-04-06 ENCOUNTER — Encounter (HOSPITAL_BASED_OUTPATIENT_CLINIC_OR_DEPARTMENT_OTHER): Payer: Self-pay | Admitting: Hematology & Oncology

## 2023-04-06 ENCOUNTER — Ambulatory Visit (HOSPITAL_BASED_OUTPATIENT_CLINIC_OR_DEPARTMENT_OTHER): Admit: 2023-04-06 | Discharge: 2023-04-06 | Disposition: A | Discharge status: Home Health | Payer: Medicare Other

## 2023-04-06 ENCOUNTER — Ambulatory Visit
Admission: RE | Admit: 2023-04-06 | Discharge: 2023-04-06 | Disposition: A | Discharge status: Home / Self Care | Payer: Medicare Other | Attending: Hematology & Oncology | Admitting: Hematology & Oncology

## 2023-04-06 VITALS — BP 124/62 | HR 68 | Temp 97.3°F | Resp 20 | Ht 66.14 in | Wt 165.8 lb

## 2023-04-06 VITALS — BP 110/66 | HR 60 | Temp 97.1°F | Resp 16 | Wt 165.8 lb

## 2023-04-06 DIAGNOSIS — C662 Malignant neoplasm of left ureter: Secondary | ICD-10-CM

## 2023-04-06 DIAGNOSIS — Z936 Other artificial openings of urinary tract status: Secondary | ICD-10-CM | POA: Insufficient documentation

## 2023-04-06 DIAGNOSIS — Z5189 Encounter for other specified aftercare: Secondary | ICD-10-CM

## 2023-04-06 DIAGNOSIS — Z5112 Encounter for antineoplastic immunotherapy: Secondary | ICD-10-CM | POA: Insufficient documentation

## 2023-04-06 DIAGNOSIS — Z95828 Presence of other vascular implants and grafts: Secondary | ICD-10-CM | POA: Insufficient documentation

## 2023-04-06 LAB — FREE THYROXINE, BLOOD: Free T4: 1.32 ng/dL (ref 0.93–1.70)

## 2023-04-06 LAB — COMPREHENSIVE METABOLIC PANEL, BLOOD
ALT (SGPT): 51 U/L — ABNORMAL HIGH (ref 0–41)
AST (SGOT): 140 U/L — ABNORMAL HIGH (ref 0–40)
Albumin: 3.7 g/dL (ref 3.5–5.2)
Alkaline Phos: 94 U/L (ref 40–129)
Anion Gap: 10 mmol/L (ref 7–15)
BUN: 22 mg/dL (ref 8–23)
Bicarbonate: 31 mmol/L — ABNORMAL HIGH (ref 22–29)
Bilirubin, Tot: 0.65 mg/dL (ref <1.2)
Calcium: 9.7 mg/dL (ref 8.5–10.6)
Chloride: 100 mmol/L (ref 98–107)
Creatinine: 1.03 mg/dL (ref 0.67–1.17)
Glucose: 109 mg/dL — ABNORMAL HIGH (ref 70–99)
Potassium: 4.3 mmol/L (ref 3.5–5.1)
Sodium: 141 mmol/L (ref 136–145)
Total Protein: 6.7 g/dL (ref 6.0–8.0)
eGFR Based on CKD-EPI 2021 Equation: >60 mL/min/{1.73_m2}

## 2023-04-06 LAB — CBC WITH DIFF, BLOOD
ANC-Automated: 2.8 10*3/uL (ref 1.6–7.0)
ANC-Instrument: 2.8 10*3/uL (ref 1.6–7.0)
Abs Basophils: 0 10*3/uL (ref <0.2)
Abs Eosinophils: 0.1 10*3/uL (ref 0.0–0.5)
Abs Lymphs: 1.3 10*3/uL (ref 0.8–3.1)
Abs Monos: 0.5 10*3/uL (ref 0.2–0.8)
Basophils: 0.6 %
Eosinophils: 2.3 %
Hct: 28.6 % — ABNORMAL LOW (ref 40.0–50.0)
Hgb: 9.4 gm/dL — ABNORMAL LOW (ref 13.7–17.5)
Imm Gran %: 0.8 % (ref <1)
Lymphocytes: 26.8 %
MCH: 27.9 pg (ref 26.0–32.0)
MCHC: 32.9 g/dL (ref 32.0–36.0)
MCV: 84.9 um3 (ref 79.0–95.0)
MPV: 9.8 fL (ref 9.4–12.4)
Monocytes: 11.2 %
Plt Count: 210 10*3/uL (ref 140–370)
RBC: 3.37 10*6/uL — ABNORMAL LOW (ref 4.60–6.10)
RDW: 18.2 % — ABNORMAL HIGH (ref 12.0–14.0)
Segs: 58.3 %
WBC: 4.7 10*3/uL (ref 4.0–10.0)

## 2023-04-06 LAB — TSH, BLOOD: TSH: 1.72 u[IU]/mL (ref 0.27–4.20)

## 2023-04-06 LAB — CORTISOL, BLOOD: Cortisol: 9.7 ug/dL

## 2023-04-06 MED ORDER — SODIUM CHLORIDE 0.9 % IV SOLN
80.0000 mg | Freq: Once | INTRAVENOUS | Status: AC
Start: 2023-04-06 — End: 2023-04-06
  Administered 2023-04-06: 80 mg via INTRAVENOUS
  Filled 2023-04-06: qty 60

## 2023-04-06 MED ORDER — SODIUM CHLORIDE 0.9 % IV SOLN
Freq: Once | INTRAVENOUS | Status: AC
Start: 2023-04-06 — End: 2023-04-06

## 2023-04-06 MED ORDER — HEPARIN SODIUM LOCK FLUSH 100 UNIT/ML IJ SOLN CUSTOM
500.0000 [IU] | INTRAVENOUS | Status: DC | PRN
Start: 2023-04-06 — End: 2023-04-06
  Administered 2023-04-06: 500 [IU] via INTRAVENOUS

## 2023-04-06 MED ORDER — SODIUM CHLORIDE 0.9 % IJ SOLN (CUSTOM)
20.0000 mL | INTRAMUSCULAR | Status: DC | PRN
Start: 2023-04-06 — End: 2023-04-07
  Administered 2023-04-06: 20 mL via INTRAVENOUS

## 2023-04-06 MED ORDER — SODIUM CHLORIDE 0.9 % IV SOLN
200.0000 mg | Freq: Once | INTRAVENOUS | Status: AC
Start: 2023-04-06 — End: 2023-04-06
  Administered 2023-04-06: 200 mg via INTRAVENOUS
  Filled 2023-04-06: qty 8

## 2023-04-06 MED ORDER — SODIUM CHLORIDE 0.9 % IJ SOLN (CUSTOM)
20.0000 mL | INTRAMUSCULAR | Status: DC | PRN
Start: 2023-04-06 — End: 2023-04-06
  Administered 2023-04-06: 20 mL via INTRAVENOUS

## 2023-04-06 MED ORDER — ONDANSETRON HCL 4 MG/2ML IV SOLN
8.0000 mg | Freq: Once | INTRAMUSCULAR | Status: AC
Start: 2023-04-06 — End: 2023-04-06
  Administered 2023-04-06: 8 mg via INTRAVENOUS
  Filled 2023-04-06: qty 4

## 2023-04-06 MED ORDER — LIDOCAINE HCL (PF) 1 % IJ SOLN
0.3000 mL | INTRAMUSCULAR | Status: DC | PRN
Start: 2023-04-06 — End: 2023-04-07

## 2023-04-06 MED ORDER — HEPARIN SODIUM LOCK FLUSH 100 UNIT/ML IJ SOLN CUSTOM
500.0000 [IU] | INTRAVENOUS | Status: DC | PRN
Start: 2023-04-06 — End: 2023-04-07
  Administered 2023-04-06: 500 [IU] via INTRAVENOUS

## 2023-04-06 NOTE — Telephone Encounter (Signed)
Return call placed to pt.   Pt states he received clearance from dentist and submit letter from Dr. Roseanne Reno detailing needed clearance letter to his dentist on 4/16.   Reviewed we have not yet received the dental clearance letter.   Advised pt to call his dentist to check on letter for our office.   Pt requests NCM also call dental office.   Call disconnected while pt looking for number for his dental office.     Call center - if pt calls back, please ask if he has the name and number for his dental office.

## 2023-04-06 NOTE — Telephone Encounter (Signed)
Pt returning Joni Reining RN call asking to speak to her. Informed pt via Joni Reining RN chat will call pt back shortly. FYI.     Best call back: [phone]

## 2023-04-06 NOTE — Telephone Encounter (Addendum)
Caller: Pt  Relationship to patient: self  Phone # [phone]  Provider: Dr. Roseanne Reno  Notes:     Pt is calling to speak to RN Joni Reining in regards to previous message. States call was disconnected,the number for the dental office is  [phone] option 3. Please assist

## 2023-04-06 NOTE — Interdisciplinary (Signed)
Patient arrived ambulatory in stable condition. Denies nausea, vomiting, diarrhea, and constipation.     RIGHT chest port prepped and accessed per sterile protocol with positive blood return. 20G 3/4 inch Huber needle.   Labs drawn: 1435  Biopatch placed: N/A  Port flushed with 20ml NS and 500 units Heparin and Curos capped. Dressing applied.   Patient tolerated well and discharged ambulatory in stable condition to lobby awaiting labs.   Infusion RN to review lab results

## 2023-04-06 NOTE — Telephone Encounter (Signed)
Call placed to pts dentist, [facility], [phone] option 3 for dental clinic.   Received VM for dental office.   Left detailed VM requesting dental clearance be faxed to our office.    Return call placed to pt and updated of above.

## 2023-04-06 NOTE — Progress Notes (Signed)
AST 140, ALT 51  Has not received EV+pem since 3/15.  Proceed with treatment.   Will monitor LFTs in 1 week.

## 2023-04-06 NOTE — Interdisciplinary (Signed)
Chemotherapy Nursing Note - [facility]    Stephen [name] is a 71 year old male who presents for chemotherapy      Type Current Treatment Planned For    Enfortumab vedotin-ejfv (Padcev) Days 1, 8 / Pembrolizumab Day 1 - Every 21 Days Oncology Treatment ([place]) Day 1, Cycle 4 Today       Current Chemotherapy Signed Informed Consent verified/obtained yes    Patient met treatment parameters.    Lab Results   Component Value Date    ABSNEUTRO 2.8 04/06/2023    IANC 2.8 04/06/2023    WBC 4.7 04/06/2023    RBC 3.37 (L) 04/06/2023    HGB 9.4 (L) 04/06/2023    HCT 28.6 (L) 04/06/2023    MCV 84.9 04/06/2023    MCHC 32.9 04/06/2023    RDW 18.2 (H) 04/06/2023    PLT 210 04/06/2023       Lab Results   Component Value Date    NA 141 04/06/2023    K 4.3 04/06/2023    CL 100 04/06/2023    BICARB 31 (H) 04/06/2023    BUN 22 04/06/2023    CREAT 1.03 04/06/2023    GLU 109 (H) 04/06/2023    [place] 9.7 04/06/2023           Lab Results   Component Value Date    AST 140 (H) 04/06/2023    ALT 51 (H) 04/06/2023    GGT 131 (H) 01/17/2022    LDH 172 01/19/2022    ALK 94 04/06/2023    TP 6.7 04/06/2023    ALB 3.7 04/06/2023    TBILI 0.65 04/06/2023    DBILI <0.2 01/19/2022        Vitals:    04/06/23 1633   BP: (!) 96/59   BP Location: Left arm   BP Patient Position: Sitting   Pulse: 50   Resp: 16   Temp: 97.8 F (36.6 C)   TempSrc: Temporal   SpO2: 99%   Weight: 75.2 kg (165 lb 12.6 oz)   Height: 5' 6.14" (1.68 m)     Pain Score: 0  Body surface area is 1.87 meters squared.  Body mass index is 26.65 kg/m.    ECOG  ECOG: 1 Not Applicable    PHQ2  PHQ2 Total: 0  Not Applicable    Chemotherapy:  Hand off given by Stephen Lush, RN.     Stephen [name] tolerated treatment well.    Post blood return: Brisk  IV access post infusion: NS and Heparin 500 units/ml     per MAR and de-accessed with needle intact. Site benign. Dressing applied    Patient Education  Learner: Patient  Barriers to learning: No Barriers  Readiness to learn:  Acceptance  Method: Explanation    Treatment Education:       Signs and symptoms of infection, bleeding, adverse reaction(s), symptom control, and when to notify MD.    Patient instructed on post-treatment care and precautions specific to drug(s) administered.   If applicable, education given on vesicant administration risk and signs and symptoms of extravasation (redness, pain, swelling, pressure at IV site) to report immediately to the chemotherapy RN.    Fall Prevention Education: Instructed patient to call for assistance, call light within reach.   Pain Education: Patient instructed to contact nurse if pain should develop or if their current pain therapy becomes ineffective.       Treatment education provided to patient: yes    Response: Verbalizes  understanding    Discharge Plan  Discharge instructions given to patient.  Future appointments:date given and reviewed with treatment plan.  Discharge Mode: Ambulatory  Discharge Time: 1908    Accompanied by: Self  Discharged To: Home    Medications   heparin (HEP-LOCK) flush injection 500 Units (500 Units IntraVENOUS Given 04/06/23 1903)   sodium chloride 0.9 % flush 20 mL (20 mL IntraVENOUS Given 04/06/23 1902)   enfortumab vedotin-ejfv (PADCEV) 80 mg in sodium chloride 0.9 % 100 mL chemo infusion (0 mg IntraVENOUS IV Stop 04/06/23 1804)   sodium chloride 0.9% infusion (0 mL IntraVENOUS Stopped 04/06/23 1904)   ondansetron (ZOFRAN) injection 8 mg (8 mg IntraVENOUS Given 04/06/23 1657)   pembrolizumab (KEYTRUDA) 200 mg in sodium chloride 0.9 % 100 mL infusion (0 mg IntraVENOUS Completed 04/06/23 1903)

## 2023-04-06 NOTE — Telephone Encounter (Signed)
Return call placed to pt.   No answer. LVM requesting return call.     Call center - if pt calls back, please  secure chat this Clinical research associate.       Pt with urothelial carcinoma. Treated with Enfortumab + Pembrolizumab. Last visit 4/15. Next visit 4/25.

## 2023-04-06 NOTE — Interdisciplinary (Signed)
Chemotherapy Nursing Note - [facility]    Stephen [name] is a 72 year old male who presents for chemotherapy cycle 4, day(s) 1 of padcev and [name].     Current Chemotherapy Signed Informed Consent verified/obtained yes    Pre-treatment nursing assessment:  Patient denies any vision changes, change in sensation/tingling of hands/feet, or s/s of pneumonitis such as SOB or cough, or new rash since last infusion. Patient does endorse pain/weakness in joints and came in w/ a walker today, but it has been ongoing.     Patient did not meet treatment parameters due to elevated ALT/AST. VORB from Dr. Roseanne Reno that it is okay to continue w/ treatment today.     Lab Results   Component Value Date    ABSNEUTRO 2.8 04/06/2023    IANC 2.8 04/06/2023    WBC 4.7 04/06/2023    RBC 3.37 (L) 04/06/2023    HGB 9.4 (L) 04/06/2023    HCT 28.6 (L) 04/06/2023    MCV 84.9 04/06/2023    MCHC 32.9 04/06/2023    RDW 18.2 (H) 04/06/2023    PLT 210 04/06/2023       Lab Results   Component Value Date    NA 141 04/06/2023    K 4.3 04/06/2023    CL 100 04/06/2023    BICARB 31 (H) 04/06/2023    BUN 22 04/06/2023    CREAT 1.03 04/06/2023    GLU 109 (H) 04/06/2023    [place] 9.7 04/06/2023           Lab Results   Component Value Date    AST 140 (H) 04/06/2023    ALT 51 (H) 04/06/2023    GGT 131 (H) 01/17/2022    LDH 172 01/19/2022    ALK 94 04/06/2023    TP 6.7 04/06/2023    ALB 3.7 04/06/2023    TBILI 0.65 04/06/2023    DBILI <0.2 01/19/2022        Vitals:    04/06/23 1633   BP: (!) 96/59   BP Location: Left arm   BP Patient Position: Sitting   Pulse: 50   Resp: 16   Temp: 97.8 F (36.6 C)   TempSrc: Temporal   SpO2: 99%   Weight: 75.2 kg (165 lb 12.6 oz)   Height: 5' 6.14" (1.68 m)     Pain Score: 0  Body surface area is 1.87 meters squared.  Body mass index is 26.65 kg/m.    ECOG  ECOG: 1     PHQ2  PHQ2 Total: 0      Chemotherapy:  [name] tolerated padcev treatment well. Passed patient on to Marissa RN at 1800.      Medications   enfortumab vedotin-ejfv (PADCEV) 80 mg in sodium chloride 0.9 % 100 mL chemo infusion (80 mg IntraVENOUS Given 04/06/23 1732)   heparin (HEP-LOCK) flush injection 500 Units (has no administration in time range)   sodium chloride 0.9 % flush 20 mL (has no administration in time range)   pembrolizumab (KEYTRUDA) 200 mg in sodium chloride 0.9 % 100 mL infusion (has no administration in time range)   sodium chloride 0.9% infusion ( IntraVENOUS New Bag 04/06/23 1652)   ondansetron (ZOFRAN) injection 8 mg (8 mg IntraVENOUS Given 04/06/23 1657)

## 2023-04-07 ENCOUNTER — Telehealth (HOSPITAL_BASED_OUTPATIENT_CLINIC_OR_DEPARTMENT_OTHER): Payer: Self-pay | Admitting: Hematology & Oncology

## 2023-04-07 ENCOUNTER — Telehealth (HOSPITAL_BASED_OUTPATIENT_CLINIC_OR_DEPARTMENT_OTHER): Payer: Self-pay

## 2023-04-07 ENCOUNTER — Encounter (HOSPITAL_BASED_OUTPATIENT_CLINIC_OR_DEPARTMENT_OTHER): Payer: Self-pay

## 2023-04-07 NOTE — Telephone Encounter (Signed)
RN received call from pt.  Pt is very tangential during the conversation.  He reports:  -Burning pain in thighs.  Denies visual skin rash.  He can't differentiate whether it is a burning skin rash pain, muscular or a bone like burning pain.  States, "I've been walking more.  Maybe it's that."  -Denies redness, swelling, erythema on legs  -Twitching in his ear.  Denies change in hearing, tinnitus.  -Asking to review upcoming appointments       Pt with advanced [facility].  He received D1C4 on 4/18.      Plan/Recommendations:  -RN reviewed 4/22, 4/25, and 4/28 appointments with pt.  He wrote these down and will schedule his transportation.  -Cetaphil body moisturizer  -OTC Claritin daily  -Kenalog cream to affected areas as needed  -On Call oncology line provided    Per pt request, [facility] sent with skin recommendations.    Message routed to provider for notification.

## 2023-04-07 NOTE — Telephone Encounter (Signed)
RN returned pt's call.  No answer so RN LVM with her callback number.

## 2023-04-07 NOTE — Telephone Encounter (Signed)
Prior authorization for oxycodone approved. Patient notified via MyChart message.

## 2023-04-07 NOTE — Telephone Encounter (Signed)
Opened in error

## 2023-04-07 NOTE — Telephone Encounter (Signed)
Pt requesting to speak to RN or MD. States he has questions regarding his treatment yesterday. Also has question in regards reaching out to his dentist    Please assist  [phone]

## 2023-04-10 ENCOUNTER — Telehealth (HOSPITAL_BASED_OUTPATIENT_CLINIC_OR_DEPARTMENT_OTHER): Payer: Self-pay

## 2023-04-10 ENCOUNTER — Ambulatory Visit: Payer: Medicare Other | Attending: Family Medicine | Admitting: Family Medicine

## 2023-04-10 ENCOUNTER — Encounter (HOSPITAL_BASED_OUTPATIENT_CLINIC_OR_DEPARTMENT_OTHER): Payer: Self-pay | Admitting: Family Medicine

## 2023-04-10 VITALS — BP 114/60 | HR 60 | Temp 97.3°F | Resp 20 | Ht 66.0 in | Wt 157.6 lb

## 2023-04-10 DIAGNOSIS — R5383 Other fatigue: Secondary | ICD-10-CM | POA: Insufficient documentation

## 2023-04-10 DIAGNOSIS — Z515 Encounter for palliative care: Secondary | ICD-10-CM

## 2023-04-10 DIAGNOSIS — R630 Anorexia: Secondary | ICD-10-CM | POA: Insufficient documentation

## 2023-04-10 DIAGNOSIS — Z9189 Other specified personal risk factors, not elsewhere classified: Secondary | ICD-10-CM

## 2023-04-10 DIAGNOSIS — K5903 Drug induced constipation: Secondary | ICD-10-CM

## 2023-04-10 DIAGNOSIS — R1310 Dysphagia, unspecified: Secondary | ICD-10-CM | POA: Insufficient documentation

## 2023-04-10 DIAGNOSIS — R110 Nausea: Secondary | ICD-10-CM | POA: Insufficient documentation

## 2023-04-10 DIAGNOSIS — R52 Pain, unspecified: Secondary | ICD-10-CM

## 2023-04-10 DIAGNOSIS — R634 Abnormal weight loss: Secondary | ICD-10-CM | POA: Insufficient documentation

## 2023-04-10 DIAGNOSIS — G4700 Insomnia, unspecified: Secondary | ICD-10-CM

## 2023-04-10 DIAGNOSIS — M62838 Other muscle spasm: Secondary | ICD-10-CM

## 2023-04-10 DIAGNOSIS — G893 Neoplasm related pain (acute) (chronic): Secondary | ICD-10-CM | POA: Insufficient documentation

## 2023-04-10 DIAGNOSIS — T402X5A Adverse effect of other opioids, initial encounter: Secondary | ICD-10-CM | POA: Insufficient documentation

## 2023-04-10 DIAGNOSIS — C7910 Secondary malignant neoplasm of unspecified urinary organs: Secondary | ICD-10-CM | POA: Insufficient documentation

## 2023-04-10 DIAGNOSIS — Z7409 Other reduced mobility: Secondary | ICD-10-CM | POA: Insufficient documentation

## 2023-04-10 DIAGNOSIS — R0609 Other forms of dyspnea: Secondary | ICD-10-CM | POA: Insufficient documentation

## 2023-04-10 MED ORDER — TRIAMCINOLONE ACETONIDE 0.1 % EX CREA
1.0000 | TOPICAL_CREAM | Freq: Two times a day (BID) | CUTANEOUS | 0 refills | Status: DC
Start: 2023-04-10 — End: 2023-04-29

## 2023-04-10 MED ORDER — TIZANIDINE HCL 4 MG OR TABS
4.0000 mg | ORAL_TABLET | Freq: Three times a day (TID) | ORAL | 0 refills | Status: DC | PRN
Start: 2023-04-10 — End: 2023-04-29

## 2023-04-10 MED ORDER — TIZANIDINE HCL 4 MG OR TABS
4.00 mg | ORAL_TABLET | Freq: Three times a day (TID) | ORAL | Status: DC | PRN
Start: ? — End: 2023-04-10

## 2023-04-10 MED ORDER — TRAZODONE HCL 50 MG OR TABS
ORAL_TABLET | ORAL | 0 refills | Status: DC
Start: 2023-04-10 — End: 2023-04-29

## 2023-04-10 NOTE — Interdisciplinary (Signed)
Palliative Care RN Note    Reason for visit: follow up   Dx urothelial carcinoma    Advanced Care Planning:  on file   POLST not on file    Assessment:    Chief complaint: pain    - Patient Orientation: A&O x 4, answers questions readily and appropriately with recent and remote memories intact    Pain Assessment:  Location: lower back  Current pain  4/10  aches         Pain Medication routine  Tylenol 1,000 mg q 8 hours prn (not taking  Oxycodone 10 mg 1 tablet q 4 hrs takes 1-2 daily   Following with pain management  Topical creams lidocaine, voltaren gel    Bowel Movements:   - Consistency normal  - managed with Senna 2 daily and docusate 100 mg bid  Started Miralax last visit   - LBM: has been having several normal bms for the last few days     Nausea:  - occasionally  Zofran prn     Sleep/Fatigue   lyrica 100 mg hs  Trazodone 50 mg hs  Still wakes up agitated periodically     Appetite  -  ok    WellBeing Assessment:   -Patient's needs currently being addressed.  Patient's affect/mood appropriate for situation.       Education: Chief Operating Officer and verbal instruction provided regarding plan of care.   Barriers to learning assessed: No barriers noted. Patient verbalized understanding of teaching and instructions.       Contact information, AVS given and reviewed related to plan of care. Questions answered to patient's satisfaction and verbalized understanding. Patient/family aware to call with any questions or concerns that may arise.    Caralee Ates RN

## 2023-04-10 NOTE — Progress Notes (Signed)
[facility] Outpatient Progress Note    Requested by Dr. Brock Ra    CC: Pain, psychosocial support    ID: Stephen [name] is a 72 year old male with a history of metastatic urothelial carcinoma, OA, HTN, MDD, gout referred to Palliative Care clinic for symptom management and psychosocial support.    Interval Events:  -Stephen [name] had a follow up with Oncology (Dr. Roseanne Reno) on 04/03/23.  Planned to continue on enfortumab and pembrolizumab.  PET/CT and MRI lumbar spine ordered.  Stephen [name] was admitted to [facility] from 03/21/23-03/24/23 with weakness and poor PO intake.  Evaluated with PT, OT and palliative care.  Started on lidocaine 4% patches and diclofenac 1% topical, discharged with oxycodone IR  1.5 to 2 tablets (  to ) PO q4h PRN pain, pregabalin  PO nightly, duloxetine CR  PO daily    Subjective:    Today we met with patient in palliative care clinic. We introduced him to our team and described our role in their care. We addressed several issues during their visit:     #) Pain: See description below.   Location: Low back; feels like shoulder pain has improved; teeth/gums; also feels pain in his thighs  Quality: Burning, tightness  Severity:      Current pain score 1-10: 8      Worst pain score 1-10: 10  Duration/Timing: constant  Modifying factors       Aggravating factors: sitting down, activities       Alleviating factors: oxycodone IR, pregabalin, tizanidine    -Has oxycodone IR  1.5 tablets to 2 tablets (  to ) PO q4h PRN pain, taking 1 tablet only per day, typically in the morning.  Feels worried about taking more due to hx of possible opioid overdose in the past requiring hospitalization  -Taking diclofenac 1% topical QID PRN pain, finds this helpful for neck pain, encouraged him to use this  -Takes pregabalin  PO nightly   -Taking duloxetine CR  PO daily  -Not taking tizanidine, mentions this was recently prescribed to him again at dose of tizanidine  PO q8h PRN, he did  pick this up but accidentally knocked it over and doesn't feel comfortable taking tablets that were on the floor.  I wrote a new prescription for him, unclear if pharmacy will be willing to fill it.  He states it helps with muscle spasms  -Also has followed with Pain Management (Dr. Earlene Plater)  -Denies any adverse effects of the oxycodone IR, pregabalin, duloxetine or tizanidine such as sedation, lethargy, myoclonus or pruritus  -Was recently prescribed triamcinolone topical cream for burning sensation on skin on back, but has not been able to get it.  Requests I resend it.  I sent for him today.  He was also told to take an oral antihistamine daily (which he is taking) along with Cetaphil lotion  -Per CURES: oxycodone IR  #90 on 03/31/23, pregabalin  #30 on 03/31/23, oxycodone IR  #90 on 03/02/23    #) Bowel movements:  -Has been having frequent BM over the last few days so temporarily stopped taking Senna  -Usually taking Senna 2 tablets PO daily, typically takes in the morning  -Takes Miralax 17gm PO daily PRN constipation and docusate PRN for constipation  -Having regular BM    #) Nausea:  -Reports nausea occasionally  -Takes prochlorperazine  PO q6h PRN for nausea, still helpful when he needs it  -Denies vomiting    #) Nutrition:  -Had some cereal and coffee  this morning  -Trying to eat soft foods which he tolerates better  -Reports fair appetite, improved since his last visit with me  -Current weight: 71.5kg (down 3kg since last visit with me)    #) Insomnia:  -Takes trazodone 50mg  1/2 tablet to 1 tablet (25mg  to 50mg ) PO nightly, paces at night, unable to sleep at times, open to trying trazodone 100mg  PO nightly PRN to see if he sleeps better    #) Fatigue and weakness:  -Mentions feeling a little dizzy today, attributes this to his neck discomfort, provided patient with some water today and he felt that this improved  -Feels fatigued during the day    #) Dyspnea on exertion:  -Has been  noticing dyspnea on exertion which he reports is mild  -Denies any fever, chills, coughing, wheezing, hemoptysis today    #) Nephrostomy tube:  -Reports no problems with nephrostomy tube today  -Has been noticing clear drainage from nephrostomy, no hematuria or abnormal color    #) Dysphagia:  -Has been noticing a sensation of tightness when he swallows  -Was referred to Speech Therapy but has been having difficulties getting established, will see if I can reach out to Oncology about this    #) Dental issues:  -Expresses concerns about how his teeth have changed and gums seem to be receding.  -Worries his dentures are not going to fit now  -Following with dentist for this problem.    #) Urothelial carcinoma:  -Planned to continue on enfortumab and pembrolizumab.  PET/CT and MRI lumbar spine ordered.    #) Debility:  -This patient has a mobility limitation that significantly impairs the patients ability to participate in toileting, feeding, grooming, bathing and dressing.  This limitation cannot be sufficiently resolved with a cane or walker. The patient's home is reported to provide adequate access for the manual wheelchair prescribed.  This chair will significantly improve the patient's ability to participate in MRADLs and he will use it on a regular basis.  The patient has not expressed any unwillingness to use the wheelchair in his home.  The patient has a caregiver who is available, willing and able to provide assistance with the wheelchair.    #) Psychosocial domain:  -Requests to get assistance getting a new walker if possible.    #) Advance care planning:  -Health care agent: Stephen [name] (sister)    ROS: Symptom Assessment      04/06/2023     5:41 PM 03/03/2023    11:10 AM 02/27/2023     8:00 AM 02/17/2023    10:44 AM 02/10/2023    12:34 PM 01/27/2023    11:04 AM 01/20/2023    11:23 AM   Palliative Care Symptom Assessment   Pain Level   9       Constipation   4       Tiredness   9       Nausea   8       Depression   3        Anxiety   3       Drowsiness   3       Appetite   7       Wellbeing   3       Dyspnea   3       Insomnia   7       ECOG 1 0  1 2 2 1    Financial Distress   1  Spiritual Pain   1           Additional ROS reviewed in detail and negative:  Review of Systems   Constitutional:  Positive for appetite change, fatigue and unexpected weight change. Negative for chills, diaphoresis and fever.   HENT:   Positive for trouble swallowing. Negative for sore throat and voice change.    Eyes:  Negative for icterus.   Respiratory:  Negative for chest tightness, cough, hemoptysis, shortness of breath and wheezing.         +Dyspnea on exertion   Cardiovascular:  Negative for chest pain, leg swelling and palpitations.   Gastrointestinal:  Positive for nausea. Negative for abdominal pain, blood in stool, constipation, diarrhea and vomiting.        +Loose stools   Genitourinary:  Positive for difficulty urinating. Negative for dysuria, frequency and hematuria.    Musculoskeletal:  Positive for arthralgias, back pain and gait problem.   Skin:  Negative for rash.   Neurological:  Positive for extremity weakness and gait problem. Negative for light-headedness and speech difficulty.   Psychiatric/Behavioral:  Positive for sleep disturbance. Negative for suicidal ideas.         Outpatient Palliative Regimen:  Acetaminophen  PO q8h PRN pain   Duloxetine CR  PO daily  Oxycodone IR  1.5 to 2 tablets (  to ) PO Q4H PRN pain  Prochlorperazine  PO q6h PRN nausea  Trazodone  PO nightly   Pregabalin 1  PO nightly   Miralax 17gm PO BID  Senna 2 tablets PO BID  Lidocaine 4% patch 12 hours on / 12 hours off  Diclofenac 1% topical QID PRN pain  Narcan PRN      Conversion to Oral Morphine Equivalent = 0 to 15    Allergies & Reactions: Review of patient's allergies indicates Sulfa drugs    Medications: (reviewed today)  Current Outpatient Medications on File Prior to Visit   Medication Sig Dispense Refill     acetaminophen (TYLENOL) 500 MG tablet Take 2 tablets (1,000 mg) by mouth every 8 hours as needed for Mild Pain (Pain Score 1-3) or Moderate Pain (Pain Score 4-6). 40 tablet 0    allopurinol (ZYLOPRIM) 100 MG tablet Take 1 tablet (100 mg) by mouth daily. 30 tablet 0    diclofenac (VOLTAREN) 1 % gel Apply 2 g topically every 6 hours as needed (Right knee pain). 1 each 0    DULoxetine (CYMBALTA) 20 MG CR capsule Take 1 capsule (20 mg) by mouth daily. 90 capsule 3    [DISCONTINUED] gabapentin (NEURONTIN) 100 MG capsule Take 1 capsule (100 mg) by mouth 3 times daily for 7 days. Start taking after your procedure is completed. 21 capsule 0    ketotifen (ALAWAY) 0.025 % ophthalmic solution Place 1 drop into both eyes 2 times daily.      lidocaine (ASPERCREME) 4 % patch Apply 1 patch topically every 24 hours. Leave patch on for 12 hours, then remove for 12 hours. 30 patch 0    Multiple Vitamins-Minerals (MULTIVITAMIN WITH MINERALS) TABS tablet Take 1 tablet by mouth daily. 30 tablet 1    naloxone (NARCAN) 4 mg/0.1 mL nasal spray For suspected opioid overdose, call 911! Then spray once in one nostril. Repeat after 3 minutes if no or minimal response using a new spray in other nostril. 2 each 0    Nutritional Supplements (NEPRO) LIQD Take 3 Cans by mouth daily. 30000 mL 11    nystatin (MYCOSTATIN) 100,000 units/mL suspension  Take 5 mL (500,000 Units) by mouth 4 times daily. 280 mL 0    oxyCODONE (ROXICODONE) 10 MG tablet Take 1.5 tablets to 2 tablets (15mg  to 20mg ) by mouth every 4 hours as needed for pain 90 tablet 0    polyethylene glycol (MIRALAX) 17 g packet Take 1 packet (17 g) by mouth every 12 hours. 1 each 0    pregabalin (LYRICA) 100 MG capsule Take 1 capsule (100 mg) by mouth at bedtime. 30 capsule 2    prochlorperazine (COMPAZINE) 10 MG tablet Take 1 tablet (10 mg) by mouth every 6 hours as needed (Nausea/Vomiting). 30 tablet 5    senna (SENOKOT) 8.6 MG tablet Take 2 tablets (17.2 mg) by mouth 2 times daily. 30  tablet 0    tamsulosin (FLOMAX) 0.4 MG capsule TAKE 1 CAPSULE BY MOUTH EVERY DAY 90 capsule 1    [DISCONTINUED] tiZANidine (ZANAFLEX) 4 MG tablet Take 1 tablet (4 mg) by mouth every 8 hours as needed.      traZODone (DESYREL) 50 MG tablet Take 1-2 tablets (50mg  to 100mg ) by mouth at bedtime as needed for insomnia 60 tablet 0    [DISCONTINUED] traZODone (DESYREL) 50 MG tablet Take 1 tablet (50 mg) by mouth at bedtime. 30 tablet 0    [DISCONTINUED] triamcinolone (KENALOG) 0.1 % cream Apply 1 Application. topically 2 times daily. Apply a thin layer as directed       No current facility-administered medications on file prior to visit.       Past Medical History:  Past Medical History:   Diagnosis Date    Chronic back pain     Congenital hydronephrosis     Gout     Headache     Hematuria     HTN (hypertension) 12/10/2021    Kidney disease     Kidney stones     Major depressive disorder, single episode     Polyarthropathy or polyarthritis of multiple sites     Retinal detachment     Urethral stricture        Past Surgical History:  Past Surgical History:   Procedure Laterality Date    CT INSERTION OF SUPRAPUBIC CATH  09/25/2015    NEPHRECTOMY Right 1955    APPENDECTOMY      COLONOSCOPY      CYSTOSCOPY      CYSTOSCOPY W/ LASER LITHOTRIPSY      OTHER SURGICAL HISTORY      Interstim 01/29/2011    SPINE SURGERY  09/21    Lumbar-sacral fusion    TRANSURETHRAL RESECTION OF PROSTATE         Social History:  Healthcare agent/surrogate decision-maker: Stephen [name] (sister)  Social History     Social History Narrative    Not on file        Family History:  Family History   Adopted: Yes   Family history unknown: Yes       PEX:  Vitals:    04/10/23 1257   BP: 114/60   BP Location: Right arm   BP Patient Position: Sitting   BP cuff size: Regular   Pulse: 60   Resp: 20   Temp: 97.3 F (36.3 C)   TempSrc: Temporal   SpO2: 98%   Weight: 71.5 kg (157 lb 9.6 oz)   Height: 5\' 6"  (1.676 m)       General: No acute distress, thin, sitting in  chair  HEENT: PERRLA, EOMI, no conjunctivitis or scleral icterus noted.   Nares patent.  Neck:  Trachea midline.  Lungs: Clear to auscultation bilaterally, no wheezes, no rhonchi, no rales  CV: Regular rate and rhythm, no murmurs, no rubs, no gallops  Abdomen: Soft, nondistended, nontender to palpation, no rebound or guarding, normoactive bowel sounds in all four quadrants, no CVAT  Neuro: Oriented x3, no dysarthria, moving all four extremities spontaneously, 5/5 strength throughout  Psych: Thought process linear and goal-directed, thought content appropriate, affect full range and appropriate, denies suicidal ideation.  No evidence of hallucinations or delusions  Skin: Warm and well perfused.  Brisk capillary refill.      Pertinent Labs:     Lab Results   Component Value Date    WBC 4.7 04/06/2023    RBC 3.37 (L) 04/06/2023    HGB 9.4 (L) 04/06/2023    HCT 28.6 (L) 04/06/2023    MCV 84.9 04/06/2023    MCHC 32.9 04/06/2023    RDW 18.2 (H) 04/06/2023    PLT 210 04/06/2023    MPV 9.8 04/06/2023       Lab Results   Component Value Date    BUN 22 04/06/2023    CREAT 1.03 04/06/2023    CL 100 04/06/2023    NA 141 04/06/2023    K 4.3 04/06/2023    [place] 9.7 04/06/2023    TBILI 0.65 04/06/2023    ALB 3.7 04/06/2023    TP 6.7 04/06/2023    AST 140 (H) 04/06/2023    ALK 94 04/06/2023    BICARB 31 (H) 04/06/2023    ALT 51 (H) 04/06/2023    GLU 109 (H) 04/06/2023       Above labs reviewed    Pertinent Diagnostic Study Findings:  CXR 03/21/23  IMPRESSION:  No acute findings to explain the provided history except for new port currently being accessed, and possible skeletal metastases worsened or new since last chest x-ray May 5th of last year    Above findings reviewed    ASSESSMENT: Stephen [name] is a 72 year old male with a history of metastatic urothelial carcinoma, OA, HTN, MDD, gout referred to Palliative Care clinic for symptom management and psychosocial support.    Medical chart reviewed in detail.  Above labs reviewed  and notable for anemia, elevated alkaline phosphatase  Above diagnostic images and reports reviewed by me personally and notable for recent CXR with no acute findings, +skeletal metastases.    We discussed our team's pain contract and how to take pain medications/safely and effectively. We discussed how to contact our team for refills and that they should only obtain pain medications from our team moving forward. We talked about how we do not provide early refills for lost/stolen medications or if pain medications are taken more than prescribed. Patient demonstrated understanding and agreed to only get pain medications from our team moving forward.       Stephen [name] was seen today for pain.    Diagnoses and all orders for this visit:    Metastatic urothelial carcinoma (CMS-HCC)  -     triamcinolone (KENALOG) 0.1 % cream; Apply 1 Application. topically 2 times daily. Apply a thin layer as directed    Cancer related pain    Impaired functional mobility, balance, gait, and endurance    Burning pain  -     triamcinolone (KENALOG) 0.1 % cream; Apply 1 Application. topically 2 times daily. Apply a thin layer as directed    Muscle spasm  -     tiZANidine (ZANAFLEX) 4 MG tablet; Take  1 tablet (4 mg) by mouth every 8 hours as needed (muscle spasm).    Nausea    Therapeutic opioid induced constipation    Weight loss    Poor appetite    Insomnia, unspecified type    Fatigue, unspecified type    Dyspnea on exertion    Dysphagia, unspecified type    At risk for dental problems    Palliative care by specialist          PLAN:  - change oxycodone IR to 10mg  PO q4h PRN pain.  Please note that patient has a history of possible opioid overdose and would not recommend long acting opioids unless absolutely necessary and clear safety plan in place  - continue acetaminophen 1000mg  PO q8h PRN pain  - continue duloxetine CR 20mg  PO daily per patient request, monitor carefully for signs/symptoms of serotonin syndrome  - continue pregabalin 100mg  PO  nightly, monitor renal function closely.  Patient reports questionable benefit, will consider weaning him off of this in the future  - restart tizanidine 4mg  PO q8h PRN muscle spasms  - sent prescription for triamcinolone topical cream PRN burning skin pain per patient request  - CURES reviewed  - Narcan PRN prescribed today and we discussed how/when to use in emergencies  - continue Senna 2 tablets PO 1-2 times daily PRN constipation  - continue docusate 100mg  PO BID PRN constipation  - continue Miralax 17gm PO BID PRN constipation  - continue prochlorperazine 10mg  PO q6h PRN nausea  - increase trazodone to 50mg  2 tablets (100mg ) PO nightly PRN insomnia.   - continue follow up with Pain Management  - Continue Nutrition follow up.  Counseled patient to stop by front desk or call Nutrition to schedule a follow up appointment  - Will reach out to Oncology regarding patient's concerns about getting Speech Therapy, walker  - please mychart message or call the palliative care clinic regarding symptom management    Follow up in palliative care clinic on 05/29/23 at 11 AM in person at [facility] (60 minutes)    Patient evaluated with the following [facility] Service Members:  Caralee Ates, RN     Thank you for your consult.    Shea Lavone Weisel, MD  Attending Physician  [facility] Palliative Care Service    Total Attending time 61 min:  61 min spent performing physical examination, counseling patient, and coordinating their care with medical team.

## 2023-04-10 NOTE — Telephone Encounter (Signed)
RN called [employer] and was provided with fax number.    Dental Clearance form faxed with confirmation.    [facility]  T:  [phone]   F:  [phone]

## 2023-04-10 NOTE — Patient Instructions (Addendum)
Stephen [name] PALLIATIVE CARE SERVICE: AFTER VISIT SUMMARY   ?   Plan   If you have a telemedicine appointment, one of our nurses may call you on the day of your appointment.     -We will continue oxycodone IR (Roxicodone)  1-2 tablets (  to ) by mouth every 4 hours as needed for pain  -Continue duloxetine CR  by mouth daily  -Continue pregabalin (Lyrica)  by mouth at bedtime  -Continue Senna 2 tablets by mouth daily, increase to twice a day as needed for constipation if needed  -Continue docusate  by mouth twice a day as needed for constipation  -Continue Miralax 1 tablespoon in water daily as needed for constipation  -Continue prochlorperazine  by mouth every 6 hours as needed for nausea  -I will reach out to Oncology regarding your questions about speech therapy referral and getting a new walker  -Make sure to make an appointment with Nutrition      We will see you for follow up on 05/29/23 at 11 AM in person at [place] (60 minutes)         List of Stephen A. [name] Service Team Members  Providers: Davy Pique, NP, Era Skeen, DO, Berenice Primas, MD, Novella Rob, MD, Margarita Mail, MD, Clint Guy, MD  ?   Administrative Assistant/Team Coordinator: Jeannetta Ellis   ?   Social Workers: Natalia, [address], Marko Stai, LCSW and [employer], [place]      Spiritual Counselors/Chaplains: Lehman Prom, [facility], Dan Europe, [place] CME  ?   Nurse Case Managers: Sherald Barge, RN, Caralee Ates RN, Georjean Mode, LVN   .............................................................................................................................................     How to contact us:   ?   RN LINE: ???????[phone] (Please leave a voicemail and we will call you back.)   FAX: ??????????????? [phone]   MyChart: ??????? Please use MyChart for non-emergent issues.   ?   Hours: Monday through Friday 8:00 am - 5:00 pm?      After hours: For urgent symptom issues after hours,  please call [phone] and ask for the on-call palliative care doctor.   ?   Emergency: Please call 911 or go to the nearest emergency room.?   .............................................................................................................................................   ?  [facility] Chart Help Desk Phone Number: [phone]      ............................................................................................................................................     Special Instructions Regarding Medications:   ?   1. Prescription Refills   -Please let us know at least 72 hours in advance if you need a refill via phone call or via MyChart message.   -Opioid Pain medications and any other controlled drug will be written at your appointment.    -If you miss an appointment, please call [phone] and make reschedule your appointment.    -You must be seen in the clinic to receive prescriptions for opioid medications and controlled medications    ?   2. Storage    -Medicines should be stored in the original bottles in a cool, dry place.     -Please keep them securely out of reach of children and pets.   ?   3. Disposal: Please dispose of your medicines safely. Here is a link to safe disposal sites in [place]: [email or web address].com/.php    ?   4. Precaution: Please keep all medications out of reach of children.   ?   .............................................................................................................................................?     Advance Care Planning   ?   Many patients ask about the differences between an Advance Directive and a POLST.  Here is a Lobbyist that clarifies the differences between these documents:    [email or web address] ?      Differences between an Advance Directive and a POLST are:    An Advance Directive is for:    1. Anyone 61 years of age or older     2. Provides instructions for future treatment    3. Appoints a health care representative    4. Guides inpatient treatment decisions when make available       A POLST is for:    1. Anyone with a serious illness - any age   2. Provides medical orders for current treatment    3. Guides actions by Emergency Medical Personnel when made available    4. Guides inpatient treatment decisions when made available   5. This is a bright pink form that should be placed on your refrigerator    .............................................................................................................................................     Patient Experience Department   ?   The Patient Experience Department acts as a bridge between our patients, hospitals and physicians to respond to concerns, issues and requests. Patient experience specialists are here to help patients and their families communicate their experiences with [facility] and navigate our health system. Please call [phone] or send an email to [email or web address].    ..............................................Marland Kitchen    MyPath    We are excited to share our new [facility], Cancer Services application: MyPath. MyPath will guide you through your unique cancer journey and connect you with our expansive network of curated support services and resources. It is available in [place] and [place].     You can download the MyPath application in four easy steps:    1.       Use the QR code to open your app store.  2.       Click on the MyPath app icon and install on your device.  3.       Open the MyPath app.  4.       Enter your [place] username and password to log in.

## 2023-04-11 NOTE — Progress Notes (Signed)
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    Stage: Cancer Staging   Ureter malignant neoplasm, left (CMS-HCC)  Staging form: Renal Pelvis And Ureter, AJCC 8th Edition  - Clinical: Stage IV (cTX, cN0, pM1) - Signed by Cheri Guppy, MD on 12/01/2022    Oncologic History:    04/22/21  CTU: filling defect in L renal pelvis  05/05/21  Cysto with L ureteroscopy revealed a L papillary tumor, biopsied: mixed high (30%) and low grade urothelial carcinoma  06/16/21  Repeat cysto, papillary tumor: pathology low grade papillary [place]  09/xx/22  Completed induction with mitogel (6 cycles)  08/19/21  HGTa of the bladder, no muscle  08/26/21  Repeat TURBT: NED, muscle identified  10/26/21  MRU: mild thickening in the L renal pelvis  12/24/21  Cysto/L ureteroscopy: dysplasia in the bladder; no disease in L ureter  05/06/22  L PCN placed  06/01/22  TURBT and L ureteroscopy: NED, stricture in place, stent placed  09/29/22  TURBT and L ureteroscopy: NED  10/22/22  MRU: Mild diffuse thickening in the L ureter/renal pelvis. Multiple osseous lesions involving the bilateral iliac crest and right acetabulum concerning for osseous metastatic disease.   11/26/22  PET/CT: diffuse osseous FDG activity with discrete osseous lesions concerning for metastatic disease.  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  Also treated for septic shock, AKI (Cr 2.5) and acute liver injury (AST/ALT 2800/2100).   S/p exchange L PCN.   02/07/23  start XRT to R Shoulder  02/10/23  C2D1 EV (dose reduced to 1.0 mg/kg) + pembro  200 mg IV  03/03/23  C3D1 EV/pembro, missed D8  03/06/23  Cysto: NED.  Cytology NED.   03/14/23  Seen at [facility] ED for dislodged tube, replaced  04/02-05/24  Admitted with weakness, poor PO intake.   04/06/23  C4D1 EV/pembro  04/13/23  C4D8 EV    Interim History:  Here today prior D8 infusion  R arm is ok, needs to f/u with rad onc to complete treatment  Compliant with skin hygiene (cetaphil)  Reports resolving groin skin rash for which he is using "some sort of cream"  Appetite better, gaining weight; eating small meals daily  Pain relatively well controlled; he takes oxycodone  Back pain is a bit better - taking Zanaflex (for muscle spasm) which helps - has Mri scheduled next week  Denies hematuria; L PCN draining well  Reports a "burning type of numbness" in his thighs bilaterally; this is intermittent. Denies numbness of hands/feet.  He's still concerned about his knees which remain bothersome  No change in meds    Review of Systems:   A complete ROS was performed and is negative except as documented above.     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  Non smoker  No etoh    Medications:  Current Outpatient Medications on File Prior to Visit   Medication Sig Dispense Refill    acetaminophen (TYLENOL) 500 MG tablet Take 2 tablets (1,000 mg) by mouth every 8 hours as needed for Mild Pain (Pain Score 1-3) or Moderate Pain (Pain Score 4-6). 40 tablet 0    allopurinol (ZYLOPRIM) 100 MG tablet Take 1 tablet (100 mg) by mouth daily. 30 tablet 0    diclofenac (VOLTAREN) 1 % gel Apply 2 g topically every 6 hours as needed (Right knee pain). 1 each 0    DULoxetine (CYMBALTA) 20 MG CR capsule Take 1 capsule (20 mg) by mouth daily. 90  capsule 3    [DISCONTINUED] gabapentin (NEURONTIN) 100 MG capsule Take 1 capsule (100 mg) by mouth 3 times daily for 7 days. Start taking after your procedure is completed. 21 capsule 0    ketotifen (ALAWAY) 0.025 % ophthalmic solution Place 1 drop into both eyes 2 times daily.      lidocaine (ASPERCREME) 4 % patch Apply 1 patch topically every 24 hours. Leave patch on for 12 hours, then remove for 12 hours. 30 patch 0    Multiple Vitamins-Minerals (MULTIVITAMIN WITH MINERALS) TABS tablet Take 1 tablet by mouth daily. 30 tablet 1    naloxone (NARCAN) 4 mg/0.1 mL nasal spray For suspected opioid overdose, call 911! Then spray once in one nostril. Repeat after 3 minutes if no or minimal response using a new spray in other nostril. 2 each 0    Nutritional Supplements (NEPRO) LIQD Take 3 Cans by mouth daily. 30000 mL 11    nystatin (MYCOSTATIN) 100,000 units/mL suspension Take 5 mL (500,000 Units) by mouth 4 times daily. 280 mL 0    oxyCODONE (ROXICODONE) 10 MG tablet Take 1.5 tablets to 2 tablets (15mg  to 20mg ) by mouth every 4 hours as needed for pain 90 tablet 0    polyethylene glycol (MIRALAX) 17 g packet Take 1 packet (17 g) by mouth every 12 hours. 1 each 0    pregabalin (LYRICA) 100 MG capsule Take 1 capsule (100 mg) by mouth at bedtime. 30 capsule 2    prochlorperazine (COMPAZINE) 10 MG tablet Take 1 tablet (10 mg) by mouth every 6 hours as needed (Nausea/Vomiting). 30 tablet 5    senna (SENOKOT) 8.6 MG tablet Take 2 tablets (17.2 mg) by mouth 2 times daily. 30 tablet 0    tamsulosin (FLOMAX) 0.4 MG capsule TAKE 1 CAPSULE BY MOUTH EVERY DAY 90 capsule 1    [DISCONTINUED] tiZANidine (ZANAFLEX) 4 MG tablet Take 1 tablet (4 mg) by mouth every 8 hours as needed.      tiZANidine (ZANAFLEX) 4 MG tablet Take 1 tablet (4 mg) by mouth every 8 hours as needed (muscle spasm). 90 tablet 0    traZODone (DESYREL) 50 MG tablet Take 1-2 tablets (50mg  to 100mg ) by mouth at bedtime as needed for insomnia 60 tablet 0     [DISCONTINUED] traZODone (DESYREL) 50 MG tablet Take 1 tablet (50 mg) by mouth at bedtime. 30 tablet 0    triamcinolone (KENALOG) 0.1 % cream Apply 1 Application. topically 2 times daily. Apply a thin layer as directed 1 each 0    [DISCONTINUED] triamcinolone (KENALOG) 0.1 % cream Apply 1 Application. topically 2 times daily. Apply a thin layer as directed  No current facility-administered medications on file prior to visit.       Physical Examination:   04/13/23  1502   BP: 115/61   Pulse: 63   Temp: 97.3 F (36.3 C)   Resp: 19   SpO2: 98%     ECOG PS 1  General: Appears chronically ill, some tangetial conversation which has been consistent during treatment.  HEENT: Moist mucous membranes. +Thrush.  Neck: No thyromegaly or lymphadenopathy.   Chest: Clear to ausculation bilaterally. No wheezes, rales or rhonchi.    Cardiac: Regular rate and rhythm without any murmurs, rubs or gallops.   Abdomen: Abd soft, nontender without any hepatosplenomegaly.   Extremities: No clubbing, cyanosis, or edema.  Skin: Resolving bil inguinal candidal rash.   Neurological: Awake, alert and oriented x 3. Strength and sensation are grossly intact.   Psychologic: Appropriate mood and affect.  GU: L PCN site clean and dry, draining clear, yellow urine.     Labs: Following labs reviewed  Lab Results   Component Value Date    WBC 6.2 04/13/2023    RBC 3.25 (L) 04/13/2023    HGB 9.4 (L) 04/13/2023    HCT 28.6 (L) 04/13/2023    MCV 88.0 04/13/2023    MCHC 32.9 04/13/2023    RDW 18.5 (H) 04/13/2023    PLT 221 04/13/2023    MPV 9.8 04/13/2023      Lab Results   Component Value Date    BUN 22 04/06/2023    CREAT 1.03 04/06/2023    CL 100 04/06/2023    NA 141 04/06/2023    K 4.3 04/06/2023    [place] 9.7 04/06/2023    TBILI 0.65 04/06/2023    ALB 3.7 04/06/2023    TP 6.7 04/06/2023    AST 140 (H) 04/06/2023    ALK 94 04/06/2023    BICARB 31 (H) 04/06/2023    ALT 51 (H) 04/06/2023    GLU 109 (H) 04/06/2023                Assessment:  Joren Rehm is a 72 year old male with OA, HTN, MDD with L UTUC with metastatic disease ([id number]), with osseous mets (Pet/CT with diffuse enhancement and discrete lesions). FGFR-TACC mutation.  Started on EV+Pembro.    I personally reviewed labs as above.  No new imaging to review for today's visit.     Here today for follow-up prior to D8 EV. Doing relatively well. Back pain somewhat improved with Zanaflex. Mri lower back scheduled 4/28. R arm pain stable; will contact rad onc to complete palliative xrt. PE revealed intraoral thrush and bil inguinal candida - rx for nystatin swish/swallow + nystatin powder sent to [facility]. Ok for infusion today. Needs PET CT to complete restaging - he will call radiology to schedule this. RTC 5/09 with Dr. Roseanne Reno.     Advanced Urothelial Carcinoma, [id number]  - Proceed with C4D8 EV today  - Treatment plan:  --- Enfortumab vedotin 1.25 --> 1.0 mg/kg (dose reduced prior to C2) D1, D8 Q21d  --- Pembrolizumab 200 mg D1 Q21d  --- prn anti-nausea meds  --- prn antidiarrheal medications  --- prn topical skin care  --- ED precautions  - RTC 5/09 with Dr. Roseanne Reno prior to C5  - MRI lumbar spine 4/28  - PET/CT, pt needs to schedule  - consider adding zometa given significnt bone disease; needs vit D level checked and dental clearance - he's known to have significant dental issues so  this needs to be cleared first  - nutrition referral    Cancer related pain  - follows with palliative care  - continue intermittent oxycodone.   - zanaflex for muscle spasm    R shoulder pain  - 2/2 metastatic lesion  - seen by ortho 1/22  - needs fllow up with rad onc to get XRT restarted  - seen by palliative care team (Dr. Margarita Mail) 2/05     L PCN, initially placed 04/2022  - tube exchange planned next exchange in 04/2023  - draining well, will monitor    Candida  - nystatin swish and swallow QID  - nystatin powder bil groin prn      Reviewed plan with the patient.  All questions answered to patient's  satisfaction.  Patient verbalized understanding and agreement with above plan.      Today's visit lasted 30 minutes - including charting, communication with other doctors or caregivers, review of labs, pertinent imaging and test results; treatment planning including note completion, order entry, and coordination of care - and consisted more than 50% of time in the face-to-face patient counseling.       Franco Nones, PA-C  Senior Physician Assistant  [facility]    Supervising Physician:  Dr. Brock Ra      [facility]  [facility]  [address]  [place], [place] [address]  [phone]

## 2023-04-13 ENCOUNTER — Ambulatory Visit (HOSPITAL_BASED_OUTPATIENT_CLINIC_OR_DEPARTMENT_OTHER): Payer: Medicare Other | Admitting: Physician Assistant

## 2023-04-13 ENCOUNTER — Encounter (HOSPITAL_BASED_OUTPATIENT_CLINIC_OR_DEPARTMENT_OTHER): Payer: Self-pay | Admitting: Physician Assistant

## 2023-04-13 ENCOUNTER — Ambulatory Visit
Admission: RE | Admit: 2023-04-13 | Discharge: 2023-04-13 | Disposition: A | Discharge status: Home / Self Care | Payer: Medicare Other | Attending: Hematology & Oncology | Admitting: Hematology & Oncology

## 2023-04-13 ENCOUNTER — Other Ambulatory Visit: Payer: Self-pay

## 2023-04-13 ENCOUNTER — Ambulatory Visit (HOSPITAL_BASED_OUTPATIENT_CLINIC_OR_DEPARTMENT_OTHER): Admit: 2023-04-13 | Discharge: 2023-04-13 | Disposition: A | Discharge status: Home Health | Payer: Medicare Other

## 2023-04-13 VITALS — BP 110/57 | HR 54 | Temp 97.2°F | Resp 18 | Ht 66.0 in | Wt 158.1 lb

## 2023-04-13 VITALS — BP 115/61 | HR 63 | Temp 97.3°F | Resp 19 | Ht 66.0 in | Wt 158.1 lb

## 2023-04-13 DIAGNOSIS — Z95828 Presence of other vascular implants and grafts: Secondary | ICD-10-CM | POA: Insufficient documentation

## 2023-04-13 DIAGNOSIS — C662 Malignant neoplasm of left ureter: Secondary | ICD-10-CM

## 2023-04-13 DIAGNOSIS — Z5189 Encounter for other specified aftercare: Secondary | ICD-10-CM

## 2023-04-13 DIAGNOSIS — B3789 Other sites of candidiasis: Secondary | ICD-10-CM

## 2023-04-13 DIAGNOSIS — Z5112 Encounter for antineoplastic immunotherapy: Secondary | ICD-10-CM | POA: Insufficient documentation

## 2023-04-13 DIAGNOSIS — C7910 Secondary malignant neoplasm of unspecified urinary organs: Secondary | ICD-10-CM

## 2023-04-13 DIAGNOSIS — B370 Candidal stomatitis: Secondary | ICD-10-CM

## 2023-04-13 LAB — CBC WITH DIFF, BLOOD
ANC-Automated: 4.4 10*3/uL (ref 1.6–7.0)
ANC-Instrument: 4.4 10*3/uL (ref 1.6–7.0)
Abs Basophils: 0 10*3/uL (ref <0.2)
Abs Eosinophils: 0.2 10*3/uL (ref 0.0–0.5)
Abs Lymphs: 1.1 10*3/uL (ref 0.8–3.1)
Abs Monos: 0.5 10*3/uL (ref 0.2–0.8)
Basophils: 0.5 %
Eosinophils: 2.6 %
Hct: 28.6 % — ABNORMAL LOW (ref 40.0–50.0)
Hgb: 9.4 gm/dL — ABNORMAL LOW (ref 13.7–17.5)
Imm Gran %: 0.5 % (ref <1)
Lymphocytes: 18.2 %
MCH: 28.9 pg (ref 26.0–32.0)
MCHC: 32.9 g/dL (ref 32.0–36.0)
MCV: 88 um3 (ref 79.0–95.0)
MPV: 9.8 fL (ref 9.4–12.4)
Monocytes: 8.1 %
Plt Count: 221 10*3/uL (ref 140–370)
RBC: 3.25 10*6/uL — ABNORMAL LOW (ref 4.60–6.10)
RDW: 18.5 % — ABNORMAL HIGH (ref 12.0–14.0)
Segs: 70.1 %
WBC: 6.2 10*3/uL (ref 4.0–10.0)

## 2023-04-13 LAB — COMPREHENSIVE METABOLIC PANEL, BLOOD
ALT (SGPT): 24 U/L (ref 0–41)
AST (SGOT): 31 U/L (ref 0–40)
Albumin: 3.6 g/dL (ref 3.5–5.2)
Alkaline Phos: 103 U/L (ref 40–129)
Anion Gap: 12 mmol/L (ref 7–15)
BUN: 26 mg/dL — ABNORMAL HIGH (ref 8–23)
Bicarbonate: 31 mmol/L — ABNORMAL HIGH (ref 22–29)
Bilirubin, Tot: 0.58 mg/dL (ref <1.2)
Calcium: 9.8 mg/dL (ref 8.5–10.6)
Chloride: 98 mmol/L (ref 98–107)
Creatinine: 1.14 mg/dL (ref 0.67–1.17)
Glucose: 165 mg/dL — ABNORMAL HIGH (ref 70–99)
Potassium: 4.3 mmol/L (ref 3.5–5.1)
Sodium: 141 mmol/L (ref 136–145)
Total Protein: 6.4 g/dL (ref 6.0–8.0)
eGFR Based on CKD-EPI 2021 Equation: >60 mL/min/{1.73_m2}

## 2023-04-13 MED ORDER — HEPARIN SODIUM LOCK FLUSH 100 UNIT/ML IJ SOLN CUSTOM
500.0000 [IU] | INTRAVENOUS | Status: DC | PRN
Start: 2023-04-13 — End: 2023-04-14
  Administered 2023-04-13: 500 [IU] via INTRAVENOUS

## 2023-04-13 MED ORDER — NYSTATIN 100000 UNIT/GM EX POWD
1.0000 | Freq: Three times a day (TID) | CUTANEOUS | 0 refills | Status: DC
Start: 2023-04-13 — End: 2023-04-13

## 2023-04-13 MED ORDER — SODIUM CHLORIDE 0.9 % IV SOLN
80.0000 mg | Freq: Once | INTRAVENOUS | Status: DC
Start: 2023-04-13 — End: 2023-04-13

## 2023-04-13 MED ORDER — LIDOCAINE HCL (PF) 1 % IJ SOLN
0.3000 mL | INTRAMUSCULAR | Status: DC | PRN
Start: 2023-04-13 — End: 2023-04-14
  Administered 2023-04-13: 0.3 mL via INTRADERMAL

## 2023-04-13 MED ORDER — SODIUM CHLORIDE 0.9 % IJ SOLN (CUSTOM)
20.0000 mL | INTRAMUSCULAR | Status: DC | PRN
Start: 2023-04-13 — End: 2023-04-13
  Administered 2023-04-13: 20 mL via INTRAVENOUS

## 2023-04-13 MED ORDER — SODIUM CHLORIDE 0.9 % IJ SOLN (CUSTOM)
20.0000 mL | INTRAMUSCULAR | Status: DC | PRN
Start: 2023-04-13 — End: 2023-04-14
  Administered 2023-04-13: 20 mL via INTRAVENOUS

## 2023-04-13 MED ORDER — NYSTATIN 100000 UNIT/GM EX POWD
1.0000 | Freq: Three times a day (TID) | CUTANEOUS | 0 refills | Status: DC
Start: 2023-04-13 — End: 2023-05-11
  Filled 2023-04-13: qty 15, 10d supply, fill #0
  Filled 2023-04-26: qty 15, 14d supply, fill #0

## 2023-04-13 MED ORDER — SODIUM CHLORIDE 0.9 % IV SOLN
Freq: Once | INTRAVENOUS | Status: AC
Start: 2023-04-13 — End: 2023-04-13

## 2023-04-13 MED ORDER — HEPARIN SODIUM LOCK FLUSH 100 UNIT/ML IJ SOLN CUSTOM
500.0000 [IU] | INTRAVENOUS | Status: DC | PRN
Start: 2023-04-13 — End: 2023-04-13
  Administered 2023-04-13: 500 [IU] via INTRAVENOUS

## 2023-04-13 MED ORDER — SODIUM CHLORIDE 0.9 % IV SOLN
70.0000 mg | Freq: Once | INTRAVENOUS | Status: AC
Start: 2023-04-13 — End: 2023-04-13
  Administered 2023-04-13: 70 mg via INTRAVENOUS
  Filled 2023-04-13: qty 40

## 2023-04-13 MED ORDER — SODIUM CHLORIDE 0.9 % IV SOLN
1.0000 mg/kg | Freq: Once | INTRAVENOUS | Status: DC
Start: 2023-04-13 — End: 2023-04-13

## 2023-04-13 MED ORDER — NYSTATIN 100000 UNIT/ML MT SUSP
5.0000 mL | Freq: Four times a day (QID) | OROMUCOSAL | 0 refills | Status: DC
Start: 2023-04-13 — End: 2023-04-29
  Filled 2023-04-13 – 2023-04-26 (×2): qty 280, 14d supply, fill #0

## 2023-04-13 MED ORDER — ONDANSETRON HCL 4 MG/2ML IV SOLN
8.0000 mg | Freq: Once | INTRAMUSCULAR | Status: AC
Start: 2023-04-13 — End: 2023-04-13
  Administered 2023-04-13: 8 mg via INTRAVENOUS
  Filled 2023-04-13: qty 4

## 2023-04-13 NOTE — Interdisciplinary (Signed)
Patient arrived ambulatory in stable condition. Denies nausea, vomiting, diarrhea, and constipation.     RIGHT chest port prepped and accessed per sterile protocol with positive blood return. 20G 3/4 inch Huber needle.   Labs drawn: 1420  Biopatch placed: N/A  Port flushed with 20ml NS and 500 units Heparin and Curos capped. Dressing applied.   Patient tolerated well and discharged ambulatory in stable condition to lobby awaiting labs.   Infusion RN to review lab results

## 2023-04-13 NOTE — Interdisciplinary (Signed)
Chemotherapy Nursing Note - [facility]    Stephen [name] is a 72 year old male who presents for chemotherapy cycle 4, day(s) 8 of Padcev and Keytruda (no [name] today).    Current Chemotherapy Signed Informed Consent verified/obtained yes    Pre-treatment nursing assessment:  No problems identified upon assessment.  Pt denied any new symptoms, screened as ordered in the treatment plan for visual changes, pulm changes, neuropathy changes, etc.  The only symptom patient is reporting is chronic pain generalized.    Patient met treatment parameters.    Lab Results   Component Value Date    ABSNEUTRO 4.4 04/13/2023    IANC 4.4 04/13/2023    WBC 6.2 04/13/2023    RBC 3.25 (L) 04/13/2023    HGB 9.4 (L) 04/13/2023    HCT 28.6 (L) 04/13/2023    MCV 88.0 04/13/2023    MCHC 32.9 04/13/2023    RDW 18.5 (H) 04/13/2023    PLT 221 04/13/2023       Lab Results   Component Value Date    NA 141 04/13/2023    K 4.3 04/13/2023    CL 98 04/13/2023    BICARB 31 (H) 04/13/2023    BUN 26 (H) 04/13/2023    CREAT 1.14 04/13/2023    GLU 165 (H) 04/13/2023    [place] 9.8 04/13/2023           Lab Results   Component Value Date    AST 31 04/13/2023    ALT 24 04/13/2023    GGT 131 (H) 01/17/2022    LDH 172 01/19/2022    ALK 103 04/13/2023    TP 6.4 04/13/2023    ALB 3.6 04/13/2023    TBILI 0.58 04/13/2023    DBILI <0.2 01/19/2022        Vitals:    04/13/23 1616   BP: 121/70   BP Location: Left arm   BP Patient Position: Sitting   Pulse: 62   Resp: 18   Temp: 98.1 F (36.7 C)   TempSrc: Temporal   SpO2: 97%   Weight: 71.7 kg (158 lb 1.6 oz)   Height: 5\' 6"  (1.676 m)     Pain Score: 0  Body surface area is 1.83 meters squared.  Body mass index is 25.52 kg/m.    ECOG  ECOG: 2     Chemotherapy:  Romuald Mccaslin tolerated treatment well.    Medications   sodium chloride 0.9 % flush 20 mL (has no administration in time range)   heparin (HEP-LOCK) flush injection 500 Units (has no administration in time range)   enfortumab vedotin-ejfv  (PADCEV) 70 mg in sodium chloride 0.9 % 100 mL chemo infusion (has no administration in time range)   sodium chloride 0.9% infusion ( IntraVENOUS New Bag 04/13/23 1632)   ondansetron (ZOFRAN) injection 8 mg (8 mg IntraVENOUS Given 04/13/23 1632)     Post blood return: Brisk  IV access post infusion: Heparin 100 units/ml and Other: Needle removed    Patient Education  Learner: Patient  Barriers to learning: No Barriers  Readiness to learn: Acceptance  Method: Explanation    Treatment Education:       Signs and symptoms of infection, bleeding, adverse reaction(s), symptom control, and when to notify MD.    Patient instructed on post-treatment care and precautions specific to drug(s) administered.   If applicable, education given on vesicant administration risk and signs and symptoms of extravasation (redness, pain, swelling, pressure at IV site)  to report immediately to the chemotherapy RN.    Fall Prevention Education: Instructed patient to call for assistance, call light within reach.   Pain Education: Patient instructed to contact nurse if pain should develop or if their current pain therapy becomes ineffective.       Treatment education provided to patient: yes    Response: Verbalizes understanding    Discharge Plan  Discharge instructions given to patient.  Future appointments:request submitted.  Discharge Mode: Walker  Discharge Time: 1755    Accompanied by: Self  Discharged To: Home

## 2023-04-13 NOTE — Patient Instructions (Addendum)
Patient Instructions:     - You will have your Enfortumab (PADCEV) infusion treatment today.    - Keep your scheduled appointment with Dr. Roseanne Reno    - Schedule your PET CT scan [phone].  - Ideally prior to your appointment with Dr. Roseanne Reno on 5/9    - MRI as scheduled     We have sent the following medications to your pharmacy:   [id number] - [place], [place] - [address]  Requested Prescriptions     Pending Prescriptions Disp Refills    nystatin (MYCOSTATIN) 100000 UNIT/GM powder 15 g 0     Sig: Apply 1 Application. topically 3 times daily. Apply to affected area         Future Appointments   Date Time Provider Department Center   04/13/2023  4:00 PM MUC INFUSION/CTI MUC Onc Chem MUC   04/16/2023  8:00 AM TH MOBILE MRI [facility] MRI [place] [facility]   04/27/2023 11:00 AM Cheri Guppy, MD [facility]   05/04/2023  1:30 PM Riley Lam, RD MUC Onc MUC   05/29/2023 11:00 AM Shea Evans, MD MUC Onc MUC       Have questions about Advanced Care Planning?  PREPARE ([email or web address])     For [place] lab locations, please visit  [email or web address]  *If Labs completed outside of [facility], it is patient's responsibility to bring a copy of results or send lab results to clinic office.     *[facility] Resource Website:  [email or web address]    Contact Information:   Brock Ra, MD   Assistant Professor of Medicine  Medical Oncology  Genitourinary Malignancy    Franco Nones, M.S., PA-C  Sr. Physician Assistant    Clinical questions or concerns  Oncology Nurse Case Manager   Elsworth Soho RN, BSN  T: [phone]  Department of Urology   [facility]  [address]  [place], [place] [address]    Dr. Roseanne Reno Follow Up Appointment Assistance  Administrative Assistant   Terence Lux  T: [phone]  F: [phone]    Monday-Friday 8:00- 4:30p.m. (Closed Holidays and  Weekends)  Please listen to message carefully for daily information.   Leave a detailed message with your name, medical record number and   telephone number. Messages are usually returned within 24 hours.     [facility] Phone List     AFTER HOURS EMERGENCY NUMBER   Ask for the Oncologist Physician On-Call.                   [phone]    Outpatient Pavilion Scheduling line: [phone]  Call for information about our new location and to schedule or cancel appointments.    [facility] Scheduling line: [phone]   Call to schedule your [name], CT Scans or MRI studies.    PET Scan Scheduling line: [phone]  Call to schedule your PET scan    Prostate MRI Scheduling line: [phone] or [phone]  Call to schedule your prostate MRI.   Performed at the [facility] ([facility]).    Nuclear Medicine Scheduling Line [phone]   Call to schedule your bone scan, Radium 223     [facility] Infusion Center: [phone]   Call to schedule, cancel, or reschedule chemotherapy appointments.  Infusion Center Hours--- Monday-Friday 7:00-7:30 p.m.; Sat-Sun 8:00-4:30pm.    [facility] ([address]): [phone]  Monday-Friday 730-530 p.m.; Closed  [place]: [phone]  Monday-Friday 8:00-5:00 p.m.  Mail order options available     Radiation Oncology: [phone]    Call to schedule, cancel, or reschedule radiation appointments    Social Worker  [place]: Agnes Lawrence, [place]    T: [phone]  [facility]: Omar Person, LCSW  T: [phone]    Financial Counselors   [phone] at [facility]   [phone] or [phone]    [facility]  [address] [place] [address]    -You can also reach [name] by [place]. To set up your MyChart please see the following site: [email or web address]  -For cancer support services, please visit out Patient and  [place] support services, please visit:  -[email or web address]  -For other support from the [place], please visit: [email or web address]  -Advanced care planning: [email or web address]      Thank you for allowing [name] to participate in your care today! We value your feedback as we strive to provide every patient with an exceptional care experience.     You may receive a patient satisfaction survey in the mail in the next few weeks. Please fill out the survey upon receipt and return it in the self-addressed envelope. Your feedback will be used to help improve the experience for all our patients at [facility].     If you want to share a positive experience you had at our facility please call We Listen at [phone]. If you have any suggestions on how we can improve our services please call the [facility] leadership team at [phone].    For medical questions or emergencies, please call your physician's office or 911. Thank you!

## 2023-04-14 NOTE — Telephone Encounter (Signed)
Christina from dental office called stating she has no received the dental form clearance, requesting form to be emailed to her to [email or web address]. Patient has an appt 5/13. Please assist.    PH: [phone] ext [phone]

## 2023-04-16 ENCOUNTER — Other Ambulatory Visit: Payer: Self-pay | Admitting: Acute Care

## 2023-04-16 ENCOUNTER — Ambulatory Visit
Admission: RE | Admit: 2023-04-16 | Discharge: 2023-04-16 | Disposition: A | Discharge status: Home / Self Care | Payer: Medicare Other | Attending: Hematology & Oncology | Admitting: Hematology & Oncology

## 2023-04-16 ENCOUNTER — Other Ambulatory Visit (HOSPITAL_BASED_OUTPATIENT_CLINIC_OR_DEPARTMENT_OTHER): Payer: Self-pay | Admitting: Physician Assistant

## 2023-04-16 DIAGNOSIS — Z87891 Personal history of nicotine dependence: Secondary | ICD-10-CM

## 2023-04-16 DIAGNOSIS — Z122 Encounter for screening for malignant neoplasm of respiratory organs: Secondary | ICD-10-CM

## 2023-04-16 DIAGNOSIS — M898X8 Other specified disorders of bone, other site: Secondary | ICD-10-CM

## 2023-04-16 DIAGNOSIS — G893 Neoplasm related pain (acute) (chronic): Secondary | ICD-10-CM

## 2023-04-16 DIAGNOSIS — M549 Dorsalgia, unspecified: Secondary | ICD-10-CM

## 2023-04-16 DIAGNOSIS — C7910 Secondary malignant neoplasm of unspecified urinary organs: Secondary | ICD-10-CM

## 2023-04-16 DIAGNOSIS — C7949 Secondary malignant neoplasm of other parts of nervous system: Secondary | ICD-10-CM | POA: Insufficient documentation

## 2023-04-16 MED ORDER — GADOBUTROL 1 MMOL/ML IV SOLN (WRAPPED RECORD)
10.0000 mL | Freq: Once | INTRAVENOUS | Status: AC
Start: 2023-04-16 — End: 2023-04-16
  Administered 2023-04-16: 7 mL via INTRAVENOUS
  Filled 2023-04-16: qty 10

## 2023-04-16 NOTE — Addendum Note (Signed)
Encounter addended by: Randol Kern on: 04/16/2023 9:43 AM   Actions taken: Charge Capture section accepted

## 2023-04-17 NOTE — Telephone Encounter (Addendum)
Dental Clearance form secure emailed to [email or web address]     Form also faxed to [phone] with fax confirmation.    Call placed to [facility] and RN LVM to see if form was received.

## 2023-04-18 MED ORDER — OXYCODONE HCL 10 MG OR TABS
ORAL_TABLET | ORAL | 0 refills | Status: DC
Start: 2023-04-18 — End: 2023-05-22

## 2023-04-20 ENCOUNTER — Telehealth (HOSPITAL_BASED_OUTPATIENT_CLINIC_OR_DEPARTMENT_OTHER): Payer: Self-pay | Admitting: Hematology & Oncology

## 2023-04-20 ENCOUNTER — Inpatient Hospital Stay
Admission: EM | Admit: 2023-04-20 | Discharge: 2023-04-22 | DRG: 394 | Disposition: A | Discharge status: Home / Self Care | Payer: Medicare Other | Attending: Internal Medicine | Admitting: Internal Medicine

## 2023-04-20 ENCOUNTER — Encounter (HOSPITAL_COMMUNITY): Payer: Self-pay

## 2023-04-20 ENCOUNTER — Emergency Department
Admission: EM | Admit: 2023-04-20 | Discharge status: Against Medical Advice | Payer: Medicare Other | Attending: Emergency Medicine | Admitting: Emergency Medicine

## 2023-04-20 ENCOUNTER — Emergency Department (HOSPITAL_COMMUNITY): Payer: Medicare Other

## 2023-04-20 DIAGNOSIS — T83022A Displacement of nephrostomy catheter, initial encounter: Secondary | ICD-10-CM | POA: Diagnosis present

## 2023-04-20 DIAGNOSIS — M899 Disorder of bone, unspecified: Secondary | ICD-10-CM

## 2023-04-20 DIAGNOSIS — R110 Nausea: Secondary | ICD-10-CM | POA: Diagnosis present

## 2023-04-20 DIAGNOSIS — D649 Anemia, unspecified: Secondary | ICD-10-CM | POA: Diagnosis present

## 2023-04-20 DIAGNOSIS — K521 Toxic gastroenteritis and colitis: Principal | ICD-10-CM | POA: Diagnosis present

## 2023-04-20 DIAGNOSIS — Y846 Urinary catheterization as the cause of abnormal reaction of the patient, or of later complication, without mention of misadventure at the time of the procedure: Secondary | ICD-10-CM | POA: Diagnosis present

## 2023-04-20 DIAGNOSIS — Z9221 Personal history of antineoplastic chemotherapy: Secondary | ICD-10-CM

## 2023-04-20 DIAGNOSIS — C662 Malignant neoplasm of left ureter: Secondary | ICD-10-CM

## 2023-04-20 DIAGNOSIS — M84550A Pathological fracture in neoplastic disease, pelvis, initial encounter for fracture: Secondary | ICD-10-CM | POA: Diagnosis present

## 2023-04-20 DIAGNOSIS — Q620 Congenital hydronephrosis: Secondary | ICD-10-CM

## 2023-04-20 DIAGNOSIS — M130 Polyarthritis, unspecified: Secondary | ICD-10-CM | POA: Diagnosis present

## 2023-04-20 DIAGNOSIS — K5903 Drug induced constipation: Secondary | ICD-10-CM | POA: Diagnosis present

## 2023-04-20 DIAGNOSIS — N35919 Unspecified urethral stricture, male, unspecified site: Secondary | ICD-10-CM | POA: Diagnosis present

## 2023-04-20 DIAGNOSIS — E876 Hypokalemia: Secondary | ICD-10-CM | POA: Diagnosis present

## 2023-04-20 DIAGNOSIS — Z85528 Personal history of other malignant neoplasm of kidney: Secondary | ICD-10-CM

## 2023-04-20 DIAGNOSIS — I10 Essential (primary) hypertension: Secondary | ICD-10-CM | POA: Diagnosis present

## 2023-04-20 DIAGNOSIS — R531 Weakness: Secondary | ICD-10-CM

## 2023-04-20 DIAGNOSIS — F32A Depression, unspecified: Secondary | ICD-10-CM | POA: Diagnosis present

## 2023-04-20 DIAGNOSIS — Z4682 Encounter for fitting and adjustment of non-vascular catheter: Secondary | ICD-10-CM

## 2023-04-20 DIAGNOSIS — M109 Gout, unspecified: Secondary | ICD-10-CM | POA: Diagnosis present

## 2023-04-20 DIAGNOSIS — Z87442 Personal history of urinary calculi: Secondary | ICD-10-CM

## 2023-04-20 DIAGNOSIS — T40605A Adverse effect of unspecified narcotics, initial encounter: Secondary | ICD-10-CM | POA: Diagnosis present

## 2023-04-20 DIAGNOSIS — C7951 Secondary malignant neoplasm of bone: Secondary | ICD-10-CM | POA: Diagnosis present

## 2023-04-20 DIAGNOSIS — Z905 Acquired absence of kidney: Secondary | ICD-10-CM

## 2023-04-20 DIAGNOSIS — Z5321 Procedure and treatment not carried out due to patient leaving prior to being seen by health care provider: Secondary | ICD-10-CM | POA: Insufficient documentation

## 2023-04-20 DIAGNOSIS — K4020 Bilateral inguinal hernia, without obstruction or gangrene, not specified as recurrent: Secondary | ICD-10-CM

## 2023-04-20 DIAGNOSIS — R54 Age-related physical debility: Secondary | ICD-10-CM | POA: Diagnosis present

## 2023-04-20 DIAGNOSIS — K5289 Other specified noninfective gastroenteritis and colitis: Secondary | ICD-10-CM | POA: Diagnosis present

## 2023-04-20 DIAGNOSIS — Z9049 Acquired absence of other specified parts of digestive tract: Secondary | ICD-10-CM

## 2023-04-20 DIAGNOSIS — M549 Dorsalgia, unspecified: Secondary | ICD-10-CM | POA: Diagnosis present

## 2023-04-20 DIAGNOSIS — R195 Other fecal abnormalities: Secondary | ICD-10-CM

## 2023-04-20 DIAGNOSIS — G8929 Other chronic pain: Secondary | ICD-10-CM | POA: Diagnosis present

## 2023-04-20 DIAGNOSIS — Z79899 Other long term (current) drug therapy: Secondary | ICD-10-CM

## 2023-04-20 DIAGNOSIS — R197 Diarrhea, unspecified: Secondary | ICD-10-CM

## 2023-04-20 LAB — CBC WITH DIFF, BLOOD
ANC-Automated: 5.6 10*3/uL (ref 1.6–7.0)
Abs Basophils: 0 10*3/uL (ref <0.2)
Abs Eosinophils: 0 10*3/uL (ref 0.0–0.5)
Abs Lymphs: 1.3 10*3/uL (ref 0.8–3.1)
Abs Monos: 0.9 10*3/uL — ABNORMAL HIGH (ref 0.2–0.8)
Basophils: 0.4 %
Eosinophils: 0.4 %
Hct: 24.5 % — ABNORMAL LOW (ref 40.0–50.0)
Hgb: 8.2 gm/dL — ABNORMAL LOW (ref 13.7–17.5)
Imm Gran %: 0.3 % (ref <1)
Lymphocytes: 16.7 %
MCH: 29.2 pg (ref 26.0–32.0)
MCHC: 33.5 g/dL (ref 32.0–36.0)
MCV: 87.2 um3 (ref 79.0–95.0)
MPV: 9.8 fL (ref 9.4–12.4)
Monocytes: 11.1 %
Plt Count: 174 10*3/uL (ref 140–370)
RBC: 2.81 10*6/uL — ABNORMAL LOW (ref 4.60–6.10)
RDW: 18.3 % — ABNORMAL HIGH (ref 12.0–14.0)
Segs: 71.1 %
WBC: 7.8 10*3/uL (ref 4.0–10.0)

## 2023-04-20 LAB — GASTROINTESTINAL PATHOGEN NUCLEIC ACID DETECTION TEST
Adenovirus F (40/41) PCR, Stool: NOT DETECTED
Astrovirus PCR, Stool: NOT DETECTED
Campylobacter Group PCR, Stool: NOT DETECTED
Cryptosporidium species PCR, Stool: NOT DETECTED
Cyclospora cayetanensis PCR, Stool: NOT DETECTED
Entamoeba histolytica PCR, Stool: NOT DETECTED
Enteroaggregative E. coli (EAEC) PCR, Stool: NOT DETECTED
Enteropathogenic E. coli (EPEC) PCR, Stool: NOT DETECTED
Enterotoxigenic E. coli (ETEC) PCR, Stool: NOT DETECTED
Giardia lamblia PCR, Stool: NOT DETECTED
Norovirus GI/GII PCR, Stool: NOT DETECTED
Plesiomonas shigelloides PCR, Stool: NOT DETECTED
Rotavirus A PCR, Stool: NOT DETECTED
Salmonella Species PCR, Stool: NOT DETECTED
Sapovirus (I, II, IV, and V) PCR, Stool: NOT DETECTED
Shiga-like toxin-producing E. coli (STEC) PCR, Stool: NOT DETECTED
Shigella species/EIEC PCR, Stool: NOT DETECTED
Vibrio Group PCR, Stool: NOT DETECTED
Vibrio cholerae PCR, Stool: NOT DETECTED
Yersinia enterocolitica PCR, Stool: NOT DETECTED

## 2023-04-20 LAB — URINALYSIS WITH CULTURE REFLEX, WHEN INDICATED
Bilirubin: NEGATIVE
Glucose: NEGATIVE
Ketones: NEGATIVE
Leuk Esterase: 500 Leu/uL — AB
Nitrite: NEGATIVE
Specific Gravity: 1.015 (ref 1.002–1.030)
Urobilinogen: NEGATIVE
WBC: >50 — AB (ref 0–?)
pH: 6 (ref 5.0–8.0)

## 2023-04-20 LAB — COMPREHENSIVE METABOLIC PANEL, BLOOD
ALT (SGPT): 13 U/L (ref 0–41)
AST (SGOT): 22 U/L (ref 0–40)
Albumin: 3.4 g/dL — ABNORMAL LOW (ref 3.5–5.2)
Alkaline Phos: 81 U/L (ref 40–129)
Anion Gap: 10 mmol/L (ref 7–15)
BUN: 17 mg/dL (ref 8–23)
Bicarbonate: 27 mmol/L (ref 22–29)
Bilirubin, Tot: 0.99 mg/dL (ref <1.2)
Calcium: 8.2 mg/dL — ABNORMAL LOW (ref 8.5–10.6)
Chloride: 99 mmol/L (ref 98–107)
Creatinine: 0.89 mg/dL (ref 0.67–1.17)
Glucose: 96 mg/dL (ref 70–99)
Potassium: 3.3 mmol/L — ABNORMAL LOW (ref 3.5–5.1)
Sodium: 136 mmol/L (ref 136–145)
Total Protein: 5.8 g/dL — ABNORMAL LOW (ref 6.0–8.0)
eGFR Based on CKD-EPI 2021 Equation: >60 mL/min/{1.73_m2}

## 2023-04-20 LAB — CLOSTRIDIOIDES (CLOSTRIDIUM) DIFFICILE TESTING ALGORITHM, STOOL: Clostridioides (Clostridium) difficile Testing Algorithm, S: NOT DETECTED

## 2023-04-20 MED ORDER — ONDANSETRON HCL 4 MG/2ML IV SOLN
4.0000 mg | Freq: Once | INTRAMUSCULAR | Status: AC
Start: 2023-04-20 — End: 2023-04-20
  Administered 2023-04-20: 4 mg via INTRAVENOUS
  Filled 2023-04-20: qty 2

## 2023-04-20 MED ORDER — ONDANSETRON 4 MG OR TBDP
4.0000 mg | ORAL_TABLET | Freq: Once | ORAL | Status: DC
Start: 2023-04-20 — End: 2023-04-20

## 2023-04-20 MED ORDER — POTASSIUM CHLORIDE 20 MEQ OR PACK
20.0000 meq | PACK | Freq: Once | ORAL | Status: AC
Start: 2023-04-20 — End: 2023-04-20
  Administered 2023-04-20: 20 meq via ORAL
  Filled 2023-04-20: qty 1

## 2023-04-20 MED ORDER — IOHEXOL 350 MG/ML IV SOLN
500.0000 mL | Freq: Once | INTRAVENOUS | Status: AC
Start: 2023-04-20 — End: 2023-04-20
  Administered 2023-04-20: 100 mL via INTRAVENOUS
  Filled 2023-04-20: qty 500

## 2023-04-20 MED ORDER — LACTATED RINGERS IV SOLN
Freq: Once | INTRAVENOUS | Status: AC
Start: 2023-04-20 — End: 2023-04-20

## 2023-04-20 NOTE — Telephone Encounter (Signed)
Return call placed to pt.    Pt c/o:   1) for past 2 days having formed stools every 15-20 minutes. Doesn't know its going to come out - just comes out. Pt wears diaper.   - + abdominal pain. Pt states 'my stomach is just bloated and tight and it hurts'  - not using imodium. Does not have any in house and unable to obtain any at this time.   Confirmed pt not taking miralax, but pt unsure if he is taking senna. Advised to HOLD all stool softeners given frequent bowel movements.     2) dark, amber color urine in PCN and left flank pain.   + chills this am. Denies fevers  Feels he is not drinking enough fluids due to nausea, difficulty with swallowing, abdominal pain.  Feels dizzy when standing     3) nausea, worse this am than previous episodes. Took compazine without relief of symptoms. Denies vomiting.   - not taking anything for. Doesn't have any in the house.       Discussed possible labs and infusion visit for IV fluids. Pt states he is unable to get to clinic for evaluation due to transportation issues. States insurance cannot provide transportation without advanced notice. Currently resides with his sister but states 'she's busy and can't take me anywhere for a few days'.     Pt states 'I feel just horrible and  hurt all over. I think I'm going to call an ambulance to come to the hospital.'  Offered to help pt call 911 for transportation to ER. Pt declines and states he is able to call and plans to come to ER today for evaluation.     Pt with urothelial carcinoma. Treated with EV+pembrolizumab Received C4D8 on 4/25. Next visit 5/9.

## 2023-04-20 NOTE — ED Notes (Addendum)
Report called to [facility] RN at [facility] [place] [phone], pt transferring to rm 530.

## 2023-04-20 NOTE — ED Notes (Signed)
Received report from sara rn       Pt to be transferred to [facility] , awaiting transport BLS       Pt resting in stretcher aox4

## 2023-04-20 NOTE — Telephone Encounter (Signed)
Symptom Call        Who is reporting the symptoms? pt    What are the symptoms: Pt is having very frequent bowel movements every 20 minutes. Urine doesn't look real good he says ad does not feel well. States he cannot get transportation. [facility] offered to call ambulance but pt declined due to ambulance will not provide ride back.Pt thinks he is getting dehydrated.having a bad headache.    When did the symptoms start: 2 days ago    Pain level 0-10:  10    Where is the pain located:      Is there any bleeding: none    Last office visit: 04/13/2023    Was a follow up appt scheduled? If YES for when?     Best way to contact patient: [phone]     Is it OK to leave a voicemail: yes    Was patient informed of turnaround time: yes    Was patient informed of ED Precautions: yes

## 2023-04-20 NOTE — EMS Narrative (Addendum)
EMS note for [id number] by [email or web address], last updated at 12:21 on 2023-04-20  [id number], [facility]     1896-12-19     71 Years       52.2 kg                      Patient's symptom(s):  R53.1 - Weakness                      Paramedic's impression(s):  R53.1 - Weakness                      Responded to a private residence for a male found sitting in his walker   complaining of increased fatigue and frequent bowel movements, mild   distress.  Pt reports that for the past three days he has felt increased   weakness and difficulty eating and retaining fluids. Pt reports that he is   a cancer pt and received his last chemo treatment last week. Pt reports   that he has also been having frequent bowel movements approximately 30   minutes apart from each other for the past 3 days that he describes as   solid and normal. Pt reports that he activated 911 today as the symptoms   would not relieve on their own.  A/O x4. GCS 15. MAE x4. Pupils PERRL @3mm .   Skins warm, pink, and dry. Pt denies any vomiting or diarrhea. Pt denies   any headache or dizziness. Pt denies any head, neck, back, abdominal, or   extremity pain x4. Pt reports of a higher frequency in bowel movements. Pt   reports of increased fatigue. Pt reports of nausea. No outward signs of   trauma. Nephrostomy bag noted. Other Secondary assessment finding   unremarkable.  Pulse oximetry to rule out hypoxia. Vital signs and BLS   assessment to rule out any abnormal findings. Pt placed in a position of   comfort on gurney and kept warm via ambulance heaters.   Pt transported to   hospital ER with full turnover and report given to RN. Pt's belongings, if   any, left with pt at bedside.                        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

## 2023-04-20 NOTE — ED Notes (Signed)
Called jerrell Rosine Abe RN at [facility] [phone] r.e. per dr Gabriel Rainwater, after pt had left ED via ems transport for transfer, radiologist had concern for nephrostomy tube p ossibly being misplaced, however pt had already left this ED for transfer to [facility] , transfer accepted report given,   Charge rn April aware, charge rn ian aaware,   [place] rn called by this rn to notify ,

## 2023-04-20 NOTE — ED Provider Notes (Signed)
ED Provider Note  [place] electronic medical record reviewed for pertinent medical history.     Stephen [name] DOB: [birth year] PMD: Renelda Loma     Chief Complaint   Patient presents with   . Weakness     BIBA from home. C/o weakness and increase bowel movements for the last 3 days. Noted L nephrostomy. Hx of bone and kidney cancer. Last chemo was last week       HPI: Stephen [name] is a 72 year old male who has a past medical history of Chronic back pain, Congenital hydronephrosis, Gout, Headache, Hematuria, HTN (hypertension) (12/10/2021), Kidney disease, Kidney stones, Major depressive disorder, single episode, Polyarthropathy or polyarthritis of multiple sites, Retinal detachment, and Urethral stricture.  Urothelial carcinoma treated with EV+pembrolizumab Received C4D8 on 4/25 presenting with diarrhea dark colored urine nausea and abdominal pain.  Patient complains of increased bowel movements for last 3 days about every 15-20 minutes he says it has not really diarrhea there is no blood.  He has generalized abdominal discomfort from having so many bowel movements and feels generally weak but no fevers some chills.  Says that his urine was dark in his nephrostomy tube but no dysuria it is now more clear there was no cloudiness.  Some nausea no vomiting.  No coughing chest pain shortness of breath          External Data Sources (Select all that apply):  specialty clinic note from (institution & date) the medical oncology clinic for-25-24. 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    Pertinent Medical History:    PMHx: As above    Past Surgical History:   Procedure Laterality Date   . CT INSERTION OF SUPRAPUBIC CATH  09/25/2015   . NEPHRECTOMY Right 1955   . APPENDECTOMY     . COLONOSCOPY     . CYSTOSCOPY     . CYSTOSCOPY W/ LASER LITHOTRIPSY     . OTHER SURGICAL HISTORY      Interstim 01/29/2011   . SPINE SURGERY  09/21    Lumbar-sacral fusion   . TRANSURETHRAL  RESECTION OF PROSTATE         Family History   Adopted: Yes   Family history unknown: Yes       Current Outpatient Medications   Medication Instructions   . acetaminophen (TYLENOL) 1,000 mg, Oral, EVERY 8 HOURS PRN   . allopurinol (ZYLOPRIM) 100 mg, Oral, DAILY   . diclofenac (VOLTAREN) 2 g, Topical, EVERY 6 HOURS PRN   . DULoxetine (CYMBALTA) 20 mg, Oral, DAILY   . ketotifen (ALAWAY) 0.025 % ophthalmic solution 1 drop, Both Eyes, 2 TIMES DAILY   . lidocaine (ASPERCREME) 4 % patch 1 patch, Transdermal, EVERY 24 HOURS, Leave patch on for 12 hours, then remove for 12 hours.   . Multiple Vitamins-Minerals (MULTIVITAMIN WITH MINERALS) TABS tablet 1 tablet, Oral, DAILY   . naloxone (NARCAN) 4 mg/0.1 mL nasal spray For suspected opioid overdose, call 911! Then spray once in one nostril. Repeat after 3 minutes if no or minimal response using a new spray in other nostril.   . Nutritional Supplements (NEPRO) LIQD 3 Cans, Oral, DAILY   . nystatin (MYCOSTATIN) 100000 UNIT/GM powder 1 Application., Topical, 3 TIMES DAILY, Apply to affected area   . nystatin (MYCOSTATIN) 500,000 Units, Oral, 4 TIMES DAILY   . nystatin (MYCOSTATIN) 500,000 Units, Swish & Swallow, 4 TIMES DAILY   . oxyCODONE (ROXICODONE) 10 MG tablet Take  1.5 tablets to 2 tablets (15mg  to 20mg ) by mouth every 4 hours as needed for pain   . polyethylene glycol (MIRALAX) 17 g, Oral, EVERY 12 HOURS   . pregabalin (LYRICA) 100 mg, Oral, AT BEDTIME   . prochlorperazine (COMPAZINE) 10 mg, Oral, EVERY 6 HOURS PRN   . senna (SENOKOT) 17.2 mg, Oral, 2 TIMES DAILY   . tamsulosin (FLOMAX) 0.4 MG capsule TAKE 1 CAPSULE BY MOUTH EVERY DAY   . tiZANidine (ZANAFLEX) 4 mg, Oral, EVERY 8 HOURS PRN   . traZODone (DESYREL) 50 MG tablet Take 1-2 tablets (50mg  to 100mg ) by mouth at bedtime as needed for insomnia   . triamcinolone (KENALOG) 0.1 % cream 1 Application., Topical, 2 TIMES DAILY, Apply a thin layer as directed       Physical Exam  BP 115/58   Pulse 60   Temp 98.1 F (36.7  C)   Resp 17   Ht 5\' 6"  (1.676 m)   Wt 73.5 kg (162 lb)   SpO2 99%   BMI 26.15 kg/m   Physical Exam  General: well appearing, sitting comfortably in bed  Head: moist mucous membranes   Eyes: pupils round and reactive bilaterally   Neck: no lymphadenopathy   CV: RRR, no murmurs   Pulm: Breathing comfortably, clear to ascultation bilaterally  Abdomen: soft, non distended, mild generalized abdominal discomfort that has nonfocal no rebound or guarding.  Left nephrostomy tube in place with straw-colored urine  MSK: no peripheral edema, no calf tenderness bilaterally   Neuro: Alert and oriented x3, mentation normal, moving all extremities equally   Skin: warm well perfused no rashes       Orders/Medications    Orders Placed This Encounter   Procedures   . CMP   . CBC w/ Diff Lavender   . Urinalysis with Culture Reflex, when indicated   . Gastrointestinal Pathogen Nucleic Acid Test       Medications   lactated ringers 1,000 mL IV bolus (has no administration in time range)       Medical Decision Making/Assessment/Plan    This is a(n) 72 year old male who has a past medical history of Chronic back pain, Congenital hydronephrosis, Gout, Headache, Hematuria, HTN (hypertension) (12/10/2021), Kidney disease, Kidney stones, Major depressive disorder, single episode, Polyarthropathy or polyarthritis of multiple sites, Retinal detachment, and Urethral stricture. and presents with increased bowel movements generalized weakness generalized abdominal discomfort.  Possible malignancy chemo related.  Patient is afebrile nontoxic appearing.  Will obtain GI pathogen labs hydrate.  Low concern for sepsis given normal vital signs low concern for intra-abdominal infection given no significant abdominal discomfort no fevers..  Patient's lab show hypokalemia no a KI no other significant electrolyte derangements his urine is near baseline LFTs unremarkable stable anemia no leukocytosis.  Will obtain CT scan given the severity of his  diarrhea.  Patient is so generally weak and having such significant bowel issues that he feels he can not go home he feels he needs hydration and we will treat symptomatically GI pathogen pending.  Will discuss with medicine for admission      ED Course/Updates/Disposition  ED Course as of 04/20/23 1806   [facility] Documentation   Thu Apr 20, 2023   1440 Anemia to 8.2 which is chronic for patient no new leukocytosis   1524 Urine is very similar to prior there is no bacteria no significant symptoms of urinary tract infection   1747 Discussed with Dr. Clementeen Graham who suggested east  campus admission   1751 Dr. Kennedy [facility] campus accepted for admission tele      Others' Documentation   Thu Apr 20, 2023   1805 S/o transfer to [place].  72 year old w urothelial [place] pending transfer to east.  Diarrhea.  Needs hydration.  Going to a tele bed.   [AL]      ED Course User Index  [AL] Derenda Fennel, MD         ED Clinical Impression as of 04/20/23 1806   Diarrhea, unspecified type   Generalized weakness   Hypokalemia   Ureter malignant neoplasm, left (CMS-HCC)       CMS-HCC/Risk Adjustment Factor Diagnoses       Needs Recertification        Category and Diagnosis From    CMS-HCC 47:  Immunodeficiency, unspecified (CMS-HCC) Miles Costain, MD on 01/10/2022    CMS-HCC 55:  Opioid overdose (CMS-HCC) [facility] on 01/29/2023    CMS-HCC 59:  Major depressive disorder, recurrent, unspecified (CMS-HCC) [place] [facility] [employer]    CMS-HCC 72:  Other specified diseases of spinal cord (CMS-HCC) Zlomislic, Vinko, MD on 05/07/2022    CMS-HCC 188:  Encounter for attention to other artificial openings of urinary tract (CMS-HCC) Cristy Hilts, MD on 11/26/2022                     Consultations - I discussed management, patient care, and disposition planning with the following consulting services :  Medicine, who recommended pending.          Data Reviewed:        Risk of Complications and/or  Morbidity:    Risk - Treatment: Admission was considered for this patient, and a final disposition was not achieved at the time this note was signed as their workup was still pending at the time of signout to the oncoming team.           Malachi Bonds, MD  04/20/23 [phone]

## 2023-04-20 NOTE — ED Notes (Signed)
Pt out of ed via stretcher with bls transport, with belongings and chart, with ems  Pt in nad

## 2023-04-20 NOTE — ED Notes (Signed)
Pt ambulatory to BR with steady/even gait using home walker.   Constantly feels like he needs to have BM, has had 1 formed brown BM since arrival to ED.   Provided with sandwich, jello, juice and banana. Tolerated well.   Awaiting CT scan.

## 2023-04-20 NOTE — ED MD Progress Note (Addendum)
ED Course as of 04/20/23 2317   [facility] Documentation   Thu Apr 20, 2023   1805 S/o transfer to [place].  72 year old w urothelial [place] pending transfer to east.  Diarrhea.  Needs hydration.  Going to a tele bed.        Others' Documentation   Thu Apr 20, 2023   1440 Anemia to 8.2 which is chronic for patient no new leukocytosis [FR]   1524 Urine is very similar to prior there is no bacteria no significant symptoms of urinary tract infection [FR]   1747 Discussed with Dr. Clementeen Graham who suggested east campus admission [FR]   1751 Dr. Kennedy [facility] campus accepted for admission tele [FR]      ED Course User Index  [FR] Malachi Bonds, MD         ED Clinical Impression as of 04/20/23 2317   Diarrhea, unspecified type   Generalized weakness   Hypokalemia   Ureter malignant neoplasm, left (CMS-HCC)     Received a call from radiology attending about final read.  Per radiology perc nephrostomy may be displaced and in the cortex and not the renal pelvis.  I called Dr. Corky Mull to relay this finding and he stated that the patient should not be transferred and should instead be kept here for admission.  Went to alert nurse and charge but patient had already left with EMS.      IMPRESSION:  Worsening osseous metastatic disease, as detailed, with a new nondisplaced pathologic fracture, measuring approximately 22 mm in transverse dimension, at the left iliac wing.    Placement of a left nephrostomy tube with pigtail component in the inferior renal cortex and medulla. Mild left hydronephrosis, improved from prior. Mild pelvic and ureteral enhancement, nonspecific, may reflect pyelitis. No evidence of pyelonephritis.    Redemonstration of moderate to large-sized stool burden with a mild amount of perirectal soft tissue edema, raising the possibility of stercoral colitis.    Important but non urgent findings/recommendations delineated above were verbally communicated by Charolett Bumpers to the care provider, AT  [place], at 22:51 on 04/20/2023.   CTRM:2003:verbal.        Called back Dr. Corky Mull and let him know that patient was already in route.  Did relay findings from final CT read to Dr. Corky Mull and we will push the chart to them.  Confirmed with nurse that nephrostomy was continuing to drain immediately prior to transfer, so unclear if the tube is even dislodged.

## 2023-04-21 ENCOUNTER — Telehealth (HOSPITAL_BASED_OUTPATIENT_CLINIC_OR_DEPARTMENT_OTHER): Payer: Self-pay | Admitting: Hematology & Oncology

## 2023-04-21 NOTE — Telephone Encounter (Signed)
RN returned pt call.  His questions were answered to his satisfaction.  Pt aware that Dr. Roseanne Reno knows about his admission.  RN will check in with him early next week.

## 2023-04-21 NOTE — Telephone Encounter (Signed)
Caller: Pt  Phone # [phone]  Provider: Roseanne Reno   Notes: Pt is requesting a call back. He is currently admitted to [facility] hospital. Pt states they want to keep him a few more nights and he does not know why. Pt is worried about not being able to have his scans done next week and would like some advice.     Caller has been advised of 48 hr turnaround time.

## 2023-04-22 ENCOUNTER — Other Ambulatory Visit (HOSPITAL_BASED_OUTPATIENT_CLINIC_OR_DEPARTMENT_OTHER): Payer: Self-pay | Admitting: Urology

## 2023-04-22 DIAGNOSIS — R339 Retention of urine, unspecified: Secondary | ICD-10-CM

## 2023-04-22 NOTE — Addendum Note (Signed)
Encounter addended by: Wannetta Sender on: 04/22/2023 10:44 AM   Actions taken: Flowsheet accepted, Charge Capture section accepted

## 2023-04-23 ENCOUNTER — Encounter (HOSPITAL_COMMUNITY): Payer: Self-pay

## 2023-04-24 ENCOUNTER — Telehealth (HOSPITAL_BASED_OUTPATIENT_CLINIC_OR_DEPARTMENT_OTHER): Payer: Self-pay

## 2023-04-24 ENCOUNTER — Encounter (HOSPITAL_BASED_OUTPATIENT_CLINIC_OR_DEPARTMENT_OTHER): Payer: Self-pay

## 2023-04-24 DIAGNOSIS — C662 Malignant neoplasm of left ureter: Secondary | ICD-10-CM

## 2023-04-24 LAB — URINE CULTURE

## 2023-04-24 MED ORDER — TAMSULOSIN HCL 0.4 MG PO CAPS
ORAL_CAPSULE | ORAL | 1 refills | Status: DC
Start: 2023-04-24 — End: 2023-04-29

## 2023-04-24 NOTE — Telephone Encounter (Signed)
Dr. Roseanne Reno,     Stephen [name] is scheduled for infusion of Padcev/Keytruda on 5/9.     Orders are currently unsigned; signed orders are required to avoid cancellation of appointment.   If orders remain unsigned, the patient will be contacted 24 hours prior to their scheduled appointment notifying them of cancellation. The infusion appointment will then be rescheduled after orders are signed.     Patient has a scheduled clinic visit on 5/9.    Thank you,   Theora Gianotti, RN  RN Liaison

## 2023-04-24 NOTE — Telephone Encounter (Signed)
Patient is calling requesting to speak with Duwayne Heck directly, I let him know of my chart message but would like a call back [phone]

## 2023-04-24 NOTE — Telephone Encounter (Signed)
RN received message from pt via CC.  Pt states he is out of the hospital and plans to make his 5/8 PET scan and 5/9 RV with Dr. Roseanne Reno.

## 2023-04-24 NOTE — ED Follow-up Note (Signed)
ED Pharmacist Culture Follow Up Note  Result Type: Urine Culture     Urine cx (+) E Faecalis and Pseudomonas putida  - pt has nephrostomy tubes, was transferred to [facility] for admission and discharged from there  - faxed over results to [facility] [phone] to be scanned into chart    The following culture results have been reviewed:    Microbiology Results (last 7 days)       Procedure Component Value - Date/Time    Clostridioides (Clostridium) difficile Testing Algorithm Liquid Stool in Sterile Container Stool/Feces [[id number]] Collected: 04/20/23 1433    Lab Status: Final result Specimen: Stool/Feces Updated: 04/20/23 2319     Clostridioides (Clostridium) difficile Testing Algorithm, S Not Detected by 2 step algorithm     Comment: Initial PCR test detected toxin gene, but toxin  production not detected by EIA.      Indicates likely colonization, treatment not   usually indicated. If clinical suspicion   persists, consider ID or GI consultation. This  result cannot exclude toxin levels below   detection limits.    ----------------------------------------------  This test uses an FDA-cleared real time PCR   assay for the qualitative detection of the   Clostridioides (Clostridium) difficile toxin B  gene (tcdB), followed by an FDA-approved rapid  enzyme-linked immunoassay (ELISA) for the   qualitative detection of Clostridioides   (Clostridium) difficile toxins A and B.       Orders for repeat testing within 7 days of   this result will be rejected unless authorized  by Infectious Disease or Microbiology section   director.         Narrative:      If specimen not collected by 04/22/2023, contact physician to  reassess need for collection. If the patient has  non-diarrheal stool, the sample will not be sent and this  will be noted in the "Sticky Notes"    Urine Culture [[id number]]  (Abnormal)  (Susceptibility) Collected: 04/20/23 1423    Lab Status: Edited Result - FINAL Specimen: Urine  Updated: 04/24/23 1049     Urine Culture Enterococcus faecalis  >100,000 CFU/mL  Identification performed by [employer]( Maldi-ToF). This test  was developed and its performance characteristics determined by [facility]. It has not been cleared or  approved by the U.S. Food and Drug Administration.  The FDA has  determined that such clearance or approval is not necessary.        Pseudomonas putida  >100,000 CFU/mL  Identification performed by [employer]( Maldi-ToF). This test  was developed and its performance characteristics determined by [facility]. It has not been cleared or  approved by the U.S. Food and Drug Administration.  The FDA has  determined that such clearance or approval is not necessary.  ----------------------------------------------  CARBAPENEM RESISTANT ORGANISM  This result has been reported to the [facility] and   Epidemiology Unit as required by CCR Title 17,  Section 2500.      Susceptibility        Enterococcus faecalis      MIC      Ampicillin 1 mcg/mL Susceptible  [1]       Daptomycin <=1 mcg/mL Susceptible  [2]       Levofloxacin <=1 mcg/mL Susceptible  [3]       Linezolid <=1 mcg/mL Susceptible  [2]       Nitrofurantoin <=16 mcg/mL Susceptible  [  2]       Penicillin G 4 mcg/mL Susceptible  [1]       Vancomycin 1 mcg/mL Susceptible  [2]                    [1]  MODERATE risk of C. diff infection     [2]  Low risk of C. diff infection     [3]  HIGH risk of C. diff infection                Susceptibility        Pseudomonas putida      MIC      Amikacin >32 mcg/mL Resistant  [1]       Aztreonam >16 mcg/mL Resistant  [1]       Cefepime 16 mcg/mL Intermediate  [2]       Ceftazidime >16 mcg/mL Resistant  [2]       Ciprofloxacin 1 mcg/mL Susceptible  [2]       Gentamicin 8 mcg/mL Intermediate  [1]       Levofloxacin 1 mcg/mL Susceptible  [2]       Meropenem 4 mcg/mL Susceptible  [2]        Piperacillin/Tazobactam 8/4 mcg/mL Susceptible  [3]       Tetracycline 4 mcg/mL Susceptible  [1]       Tobramycin 4 mcg/mL Susceptible  [1]                    [1]  Low risk of C. diff infection     [2]  HIGH risk of C. diff infection     [3]  MODERATE risk of C. diff infection                            Chana Bode, PharmD, BCCCP

## 2023-04-24 NOTE — Telephone Encounter (Signed)
RN called pt to check in and see how he is doing/see if he is still in the hospital.  No answer so RN LVM with her callback number.

## 2023-04-24 NOTE — Telephone Encounter (Signed)
RN called pt, no answer so RN LVM with her callback number.  Also stated that pt can send mychart message to Dr. Roseanne Reno.

## 2023-04-25 ENCOUNTER — Other Ambulatory Visit: Payer: Self-pay

## 2023-04-26 ENCOUNTER — Ambulatory Visit (HOSPITAL_BASED_OUTPATIENT_CLINIC_OR_DEPARTMENT_OTHER)
Admit: 2023-04-26 | Discharge: 2023-04-26 | Disposition: A | Discharge status: Home Health | Payer: Medicare Other | Attending: Hematology & Oncology | Admitting: Hematology & Oncology

## 2023-04-26 ENCOUNTER — Other Ambulatory Visit: Payer: Self-pay

## 2023-04-26 DIAGNOSIS — Z87442 Personal history of urinary calculi: Secondary | ICD-10-CM

## 2023-04-26 DIAGNOSIS — C7951 Secondary malignant neoplasm of bone: Secondary | ICD-10-CM | POA: Diagnosis present

## 2023-04-26 DIAGNOSIS — D849 Immunodeficiency, unspecified: Secondary | ICD-10-CM | POA: Diagnosis present

## 2023-04-26 DIAGNOSIS — M130 Polyarthritis, unspecified: Secondary | ICD-10-CM | POA: Diagnosis present

## 2023-04-26 DIAGNOSIS — Q620 Congenital hydronephrosis: Secondary | ICD-10-CM

## 2023-04-26 DIAGNOSIS — G893 Neoplasm related pain (acute) (chronic): Secondary | ICD-10-CM | POA: Diagnosis present

## 2023-04-26 DIAGNOSIS — G928 Other toxic encephalopathy: Secondary | ICD-10-CM | POA: Diagnosis present

## 2023-04-26 DIAGNOSIS — G4700 Insomnia, unspecified: Secondary | ICD-10-CM | POA: Diagnosis present

## 2023-04-26 DIAGNOSIS — I129 Hypertensive chronic kidney disease with stage 1 through stage 4 chronic kidney disease, or unspecified chronic kidney disease: Secondary | ICD-10-CM | POA: Diagnosis present

## 2023-04-26 DIAGNOSIS — N189 Chronic kidney disease, unspecified: Secondary | ICD-10-CM | POA: Diagnosis present

## 2023-04-26 DIAGNOSIS — M109 Gout, unspecified: Secondary | ICD-10-CM | POA: Diagnosis present

## 2023-04-26 DIAGNOSIS — N1330 Unspecified hydronephrosis: Secondary | ICD-10-CM

## 2023-04-26 DIAGNOSIS — M6282 Rhabdomyolysis: Principal | ICD-10-CM | POA: Diagnosis present

## 2023-04-26 DIAGNOSIS — Z79899 Other long term (current) drug therapy: Secondary | ICD-10-CM

## 2023-04-26 DIAGNOSIS — N400 Enlarged prostate without lower urinary tract symptoms: Secondary | ICD-10-CM | POA: Diagnosis present

## 2023-04-26 DIAGNOSIS — Z95828 Presence of other vascular implants and grafts: Secondary | ICD-10-CM

## 2023-04-26 DIAGNOSIS — Z436 Encounter for attention to other artificial openings of urinary tract: Secondary | ICD-10-CM

## 2023-04-26 DIAGNOSIS — Z9079 Acquired absence of other genital organ(s): Secondary | ICD-10-CM

## 2023-04-26 DIAGNOSIS — D649 Anemia, unspecified: Secondary | ICD-10-CM | POA: Diagnosis present

## 2023-04-26 DIAGNOSIS — C642 Malignant neoplasm of left kidney, except renal pelvis: Secondary | ICD-10-CM

## 2023-04-26 DIAGNOSIS — C7910 Secondary malignant neoplasm of unspecified urinary organs: Secondary | ICD-10-CM

## 2023-04-26 DIAGNOSIS — I959 Hypotension, unspecified: Secondary | ICD-10-CM | POA: Diagnosis present

## 2023-04-26 DIAGNOSIS — N179 Acute kidney failure, unspecified: Secondary | ICD-10-CM | POA: Diagnosis present

## 2023-04-26 DIAGNOSIS — C679 Malignant neoplasm of bladder, unspecified: Secondary | ICD-10-CM

## 2023-04-26 LAB — GLUCOSE POCT, BLOOD: Glucose (POCT): 98 mg/dL (ref 70–99)

## 2023-04-26 MED ORDER — FLUDEOXYGLUCOSE F-18 IV KIT
9.1300 | PACK | Freq: Once | INTRAVENOUS | Status: AC
Start: 2023-04-26 — End: 2023-04-26
  Administered 2023-04-26: 9.13 via INTRAVENOUS
  Filled 2023-04-26: qty 9.13

## 2023-04-27 ENCOUNTER — Inpatient Hospital Stay
Admission: EM | Admit: 2023-04-27 | Discharge: 2023-04-29 | DRG: 557 | Disposition: A | Discharge status: Home / Self Care | Payer: Medicare Other | Source: Ambulatory Visit | Attending: Student in an Organized Health Care Education/Training Program | Admitting: Student in an Organized Health Care Education/Training Program

## 2023-04-27 ENCOUNTER — Ambulatory Visit (HOSPITAL_BASED_OUTPATIENT_CLINIC_OR_DEPARTMENT_OTHER): Payer: Medicare Other | Admitting: Hematology & Oncology

## 2023-04-27 ENCOUNTER — Ambulatory Visit (HOSPITAL_BASED_OUTPATIENT_CLINIC_OR_DEPARTMENT_OTHER): Admit: 2023-04-27 | Discharge: 2023-04-27 | Disposition: A | Discharge status: Home Health | Payer: Medicare Other

## 2023-04-27 ENCOUNTER — Telehealth (HOSPITAL_BASED_OUTPATIENT_CLINIC_OR_DEPARTMENT_OTHER): Payer: Self-pay | Admitting: Hematology & Oncology

## 2023-04-27 ENCOUNTER — Emergency Department (HOSPITAL_BASED_OUTPATIENT_CLINIC_OR_DEPARTMENT_OTHER): Payer: Medicare Other

## 2023-04-27 ENCOUNTER — Encounter (HOSPITAL_BASED_OUTPATIENT_CLINIC_OR_DEPARTMENT_OTHER): Payer: Self-pay | Admitting: Acute Care

## 2023-04-27 ENCOUNTER — Encounter (HOSPITAL_BASED_OUTPATIENT_CLINIC_OR_DEPARTMENT_OTHER): Payer: Self-pay | Admitting: Physician Assistant

## 2023-04-27 VITALS — BP 84/49 | HR 54 | Temp 97.7°F | Resp 15

## 2023-04-27 VITALS — BP 45/21 | HR 55

## 2023-04-27 DIAGNOSIS — Z7409 Other reduced mobility: Secondary | ICD-10-CM

## 2023-04-27 DIAGNOSIS — G9340 Encephalopathy, unspecified: Secondary | ICD-10-CM

## 2023-04-27 DIAGNOSIS — M109 Gout, unspecified: Secondary | ICD-10-CM | POA: Diagnosis present

## 2023-04-27 DIAGNOSIS — I129 Hypertensive chronic kidney disease with stage 1 through stage 4 chronic kidney disease, or unspecified chronic kidney disease: Secondary | ICD-10-CM | POA: Diagnosis present

## 2023-04-27 DIAGNOSIS — R4182 Altered mental status, unspecified: Secondary | ICD-10-CM | POA: Diagnosis present

## 2023-04-27 DIAGNOSIS — C642 Malignant neoplasm of left kidney, except renal pelvis: Secondary | ICD-10-CM

## 2023-04-27 DIAGNOSIS — R2689 Other abnormalities of gait and mobility: Secondary | ICD-10-CM

## 2023-04-27 DIAGNOSIS — I77810 Thoracic aortic ectasia: Secondary | ICD-10-CM

## 2023-04-27 DIAGNOSIS — D849 Immunodeficiency, unspecified: Secondary | ICD-10-CM | POA: Diagnosis present

## 2023-04-27 DIAGNOSIS — D649 Anemia, unspecified: Secondary | ICD-10-CM | POA: Diagnosis present

## 2023-04-27 DIAGNOSIS — N179 Acute kidney failure, unspecified: Secondary | ICD-10-CM

## 2023-04-27 DIAGNOSIS — I959 Hypotension, unspecified: Secondary | ICD-10-CM

## 2023-04-27 DIAGNOSIS — G928 Other toxic encephalopathy: Secondary | ICD-10-CM | POA: Diagnosis present

## 2023-04-27 DIAGNOSIS — M130 Polyarthritis, unspecified: Secondary | ICD-10-CM | POA: Diagnosis present

## 2023-04-27 DIAGNOSIS — C7951 Secondary malignant neoplasm of bone: Secondary | ICD-10-CM | POA: Diagnosis present

## 2023-04-27 DIAGNOSIS — I10 Essential (primary) hypertension: Principal | ICD-10-CM

## 2023-04-27 DIAGNOSIS — N1330 Unspecified hydronephrosis: Secondary | ICD-10-CM

## 2023-04-27 DIAGNOSIS — Z9079 Acquired absence of other genital organ(s): Secondary | ICD-10-CM

## 2023-04-27 DIAGNOSIS — C678 Malignant neoplasm of overlapping sites of bladder: Secondary | ICD-10-CM

## 2023-04-27 DIAGNOSIS — I9589 Other hypotension: Secondary | ICD-10-CM

## 2023-04-27 DIAGNOSIS — G4700 Insomnia, unspecified: Secondary | ICD-10-CM | POA: Diagnosis present

## 2023-04-27 DIAGNOSIS — N400 Enlarged prostate without lower urinary tract symptoms: Secondary | ICD-10-CM | POA: Diagnosis present

## 2023-04-27 DIAGNOSIS — Z79899 Other long term (current) drug therapy: Secondary | ICD-10-CM

## 2023-04-27 DIAGNOSIS — Z95828 Presence of other vascular implants and grafts: Secondary | ICD-10-CM | POA: Insufficient documentation

## 2023-04-27 DIAGNOSIS — Q620 Congenital hydronephrosis: Secondary | ICD-10-CM

## 2023-04-27 DIAGNOSIS — Z87442 Personal history of urinary calculi: Secondary | ICD-10-CM

## 2023-04-27 DIAGNOSIS — Z960 Presence of urogenital implants: Secondary | ICD-10-CM

## 2023-04-27 DIAGNOSIS — G893 Neoplasm related pain (acute) (chronic): Secondary | ICD-10-CM | POA: Diagnosis present

## 2023-04-27 DIAGNOSIS — N189 Chronic kidney disease, unspecified: Secondary | ICD-10-CM | POA: Diagnosis present

## 2023-04-27 DIAGNOSIS — R0602 Shortness of breath: Secondary | ICD-10-CM

## 2023-04-27 DIAGNOSIS — Z5189 Encounter for other specified aftercare: Secondary | ICD-10-CM

## 2023-04-27 DIAGNOSIS — R7989 Other specified abnormal findings of blood chemistry: Secondary | ICD-10-CM

## 2023-04-27 DIAGNOSIS — C662 Malignant neoplasm of left ureter: Secondary | ICD-10-CM | POA: Insufficient documentation

## 2023-04-27 DIAGNOSIS — C679 Malignant neoplasm of bladder, unspecified: Secondary | ICD-10-CM

## 2023-04-27 DIAGNOSIS — M6282 Rhabdomyolysis: Secondary | ICD-10-CM | POA: Diagnosis present

## 2023-04-27 DIAGNOSIS — C689 Malignant neoplasm of urinary organ, unspecified: Secondary | ICD-10-CM

## 2023-04-27 DIAGNOSIS — N3000 Acute cystitis without hematuria: Secondary | ICD-10-CM

## 2023-04-27 LAB — COMPREHENSIVE METABOLIC PANEL, BLOOD
ALT (SGPT): 41 U/L (ref 0–41)
ALT (SGPT): 40 U/L (ref 0–41)
AST (SGOT): 121 U/L — ABNORMAL HIGH (ref 0–40)
AST (SGOT): 112 U/L — ABNORMAL HIGH (ref 0–40)
Albumin: 3 g/dL — ABNORMAL LOW (ref 3.5–5.2)
Albumin: 3.2 g/dL — ABNORMAL LOW (ref 3.5–5.2)
Alkaline Phos: 77 U/L (ref 40–129)
Alkaline Phos: 78 U/L (ref 40–129)
Anion Gap: 14 mmol/L (ref 7–15)
Anion Gap: 10 mmol/L (ref 7–15)
BUN: 38 mg/dL — ABNORMAL HIGH (ref 8–23)
BUN: 38 mg/dL — ABNORMAL HIGH (ref 8–23)
Bicarbonate: 23 mmol/L (ref 22–29)
Bicarbonate: 28 mmol/L (ref 22–29)
Bilirubin, Tot: 0.92 mg/dL (ref <1.2)
Bilirubin, Tot: 0.86 mg/dL (ref <1.2)
Calcium: 9 mg/dL (ref 8.5–10.6)
Calcium: 8.7 mg/dL (ref 8.5–10.6)
Chloride: 99 mmol/L (ref 98–107)
Chloride: 100 mmol/L (ref 98–107)
Creatinine: 2.35 mg/dL — ABNORMAL HIGH (ref 0.67–1.17)
Creatinine: 2.37 mg/dL — ABNORMAL HIGH (ref 0.67–1.17)
Glucose: 167 mg/dL — ABNORMAL HIGH (ref 70–99)
Glucose: 153 mg/dL — ABNORMAL HIGH (ref 70–99)
Potassium: 4.3 mmol/L (ref 3.5–5.1)
Potassium: 4.2 mmol/L (ref 3.5–5.1)
Sodium: 136 mmol/L (ref 136–145)
Sodium: 138 mmol/L (ref 136–145)
Total Protein: 5.7 g/dL — ABNORMAL LOW (ref 6.0–8.0)
Total Protein: 5.8 g/dL — ABNORMAL LOW (ref 6.0–8.0)
eGFR Based on CKD-EPI 2021 Equation: 29 mL/min/{1.73_m2}
eGFR Based on CKD-EPI 2021 Equation: 29 mL/min/{1.73_m2}

## 2023-04-27 LAB — ECG 12-LEAD
ATRIAL RATE: 54 {beats}/min
P AXIS: 53 degrees
PR INTERVAL: 200 ms
QRS INTERVAL/DURATION: 98 ms
QT: 424 ms
QTc (Bazett): 402 ms
QTc (Fredericia): 409 ms
R AXIS: 57 degrees
T AXIS: 41 degrees
VENTRICULAR RATE: 54 {beats}/min

## 2023-04-27 LAB — BASIC METABOLIC PANEL, BLOOD
Anion Gap: 11 mmol/L (ref 7–15)
BUN: 32 mg/dL — ABNORMAL HIGH (ref 8–23)
Bicarbonate: 26 mmol/L (ref 22–29)
Calcium: 8.8 mg/dL (ref 8.5–10.6)
Chloride: 101 mmol/L (ref 98–107)
Creatinine: 1.74 mg/dL — ABNORMAL HIGH (ref 0.67–1.17)
Glucose: 165 mg/dL — ABNORMAL HIGH (ref 70–99)
Potassium: 4.4 mmol/L (ref 3.5–5.1)
Sodium: 138 mmol/L (ref 136–145)
eGFR Based on CKD-EPI 2021 Equation: 42 mL/min/{1.73_m2}

## 2023-04-27 LAB — CBC WITH DIFF, BLOOD
ANC-Automated: 8.5 10*3/uL — ABNORMAL HIGH (ref 1.6–7.0)
ANC-Automated: 6.9 10*3/uL (ref 1.6–7.0)
ANC-Instrument: 8.5 10*3/uL — ABNORMAL HIGH (ref 1.6–7.0)
Abs Basophils: 0 10*3/uL (ref <0.2)
Abs Basophils: 0 10*3/uL (ref <0.2)
Abs Eosinophils: 0 10*3/uL (ref 0.0–0.5)
Abs Eosinophils: 0 10*3/uL (ref 0.0–0.5)
Abs Lymphs: 0.9 10*3/uL (ref 0.8–3.1)
Abs Lymphs: 0.7 10*3/uL — ABNORMAL LOW (ref 0.8–3.1)
Abs Monos: 1 10*3/uL — ABNORMAL HIGH (ref 0.2–0.8)
Abs Monos: 0.9 10*3/uL — ABNORMAL HIGH (ref 0.2–0.8)
Basophils: 0.2 %
Basophils: 0.2 %
Eosinophils: 0.1 %
Eosinophils: 0.1 %
Hct: 25 % — ABNORMAL LOW (ref 40.0–50.0)
Hct: 23.6 % — ABNORMAL LOW (ref 40.0–50.0)
Hgb: 8.4 gm/dL — ABNORMAL LOW (ref 13.7–17.5)
Hgb: 8 gm/dL — ABNORMAL LOW (ref 13.7–17.5)
Imm Gran %: 0.6 % (ref <1)
Imm Gran %: 0.8 % (ref <1)
Imm Gran Abs: 0.1 10*3/uL — ABNORMAL HIGH (ref <0.1)
Imm Gran Abs: 0.1 10*3/uL — ABNORMAL HIGH (ref <0.1)
Lymphocytes: 8.7 %
Lymphocytes: 8.6 %
MCH: 28.8 pg (ref 26.0–32.0)
MCH: 29 pg (ref 26.0–32.0)
MCHC: 33.6 g/dL (ref 32.0–36.0)
MCHC: 33.9 g/dL (ref 32.0–36.0)
MCV: 85.6 um3 (ref 79.0–95.0)
MCV: 85.5 um3 (ref 79.0–95.0)
MPV: 9.5 fL (ref 9.4–12.4)
MPV: 9.9 fL (ref 9.4–12.4)
Monocytes: 9.4 %
Monocytes: 10 %
Plt Count: 238 10*3/uL (ref 140–370)
Plt Count: 199 10*3/uL (ref 140–370)
RBC: 2.92 10*6/uL — ABNORMAL LOW (ref 4.60–6.10)
RBC: 2.76 10*6/uL — ABNORMAL LOW (ref 4.60–6.10)
RDW: 17.6 % — ABNORMAL HIGH (ref 12.0–14.0)
RDW: 17.7 % — ABNORMAL HIGH (ref 12.0–14.0)
Segs: 81 %
Segs: 80.3 %
WBC: 10.5 10*3/uL — ABNORMAL HIGH (ref 4.0–10.0)
WBC: 8.6 10*3/uL (ref 4.0–10.0)

## 2023-04-27 LAB — CKMB+INDEX (NO CPK), BLOOD
CK-MB Index: 2.1 %
CK-MB Index: 2.1 %
CK-MB Index: 2.2 %
CK-MB: 133 ng/mL — ABNORMAL HIGH (ref 0–5)
CK-MB: 127 ng/mL — ABNORMAL HIGH (ref 0–5)
CK-MB: 122 ng/mL — ABNORMAL HIGH (ref 0–5)

## 2023-04-27 LAB — URINALYSIS WITH CULTURE REFLEX, WHEN INDICATED
Bilirubin: NEGATIVE
Glucose: NEGATIVE
Ketones: NEGATIVE
Leuk Esterase: 250 Leu/uL — AB
Nitrite: NEGATIVE
Specific Gravity: 1.012 (ref 1.002–1.030)
Urobilinogen: NEGATIVE
pH: 5.5 (ref 5.0–8.0)

## 2023-04-27 LAB — 2D ECHO WITH IMAGE ENHANCEMENT AGENT IF NECESSARY
LA Diameter: 3.93 cm
LA Volume Index: 43.3 ml/m2
LV Diastolic Diameter: 5.79 cm
LV Diastolic Volume Index: 75 ml/m2
LV Diastolic Volume: 137.2 ml
LV Ejection Fraction: 57 %
LV Systolic Diameter: 3.65 cm
LV Systolic Volume Index: 32 ml/m2
LV Systolic Volume: 58.8 ml
Mitral E:E' Ratio: 6.41 unitless
PA Pressure: 22 mmHg
TR Velocity: 2.17 m/s

## 2023-04-27 LAB — CPK-CREATINE PHOSPHOKINASE, BLOOD
CPK: 6231 U/L (ref 0–175)
CPK: 5992 U/L (ref 0–175)
CPK: 5616 U/L (ref 0–175)
CPK: 4581 U/L — ABNORMAL HIGH (ref 0–175)

## 2023-04-27 LAB — PROTHROMBIN TIME, BLOOD
INR: 1.3
PT,Patient: 14.4 s — ABNORMAL HIGH (ref 9.7–12.5)

## 2023-04-27 LAB — LACTATE, BLOOD: Lactate: 1.2 mmol/L (ref 0.5–2.0)

## 2023-04-27 LAB — APTT, BLOOD: PTT: 29 s (ref 27–36)

## 2023-04-27 LAB — TROPONIN T GEN 5 W/REFLEX TO CK/CKMB
Troponin T Gen 5 w/Reflex CK/CKMB: 202 ng/L (ref <22)
Troponin T Gen 5: 197 ng/L (ref <22)
Troponin T Gen 5: 181 ng/L (ref <22)

## 2023-04-27 LAB — FREE THYROXINE, BLOOD: Free T4: 1.12 ng/dL (ref 0.93–1.70)

## 2023-04-27 LAB — PHOSPHORUS, BLOOD: Phosphorous: 4.1 mg/dL (ref 2.7–4.5)

## 2023-04-27 LAB — MAGNESIUM, BLOOD: Magnesium: 1.8 mg/dL (ref 1.6–2.4)

## 2023-04-27 LAB — PRO BNP, BLOOD: BNPP: 1109 pg/mL — ABNORMAL HIGH (ref 0–899)

## 2023-04-27 LAB — TSH, BLOOD: TSH: 2.19 u[IU]/mL (ref 0.27–4.20)

## 2023-04-27 LAB — GLUCOSE POCT, BLOOD: Glucose (POCT): 172 mg/dL — ABNORMAL HIGH (ref 70–99)

## 2023-04-27 MED ORDER — HEPARIN SODIUM LOCK FLUSH 100 UNIT/ML IJ SOLN CUSTOM
500.0000 [IU] | INTRAVENOUS | Status: DC | PRN
Start: 2023-04-27 — End: 2023-04-28

## 2023-04-27 MED ORDER — SODIUM CHLORIDE 0.9 % IV BOLUS
500.0000 mL | INJECTION | Freq: Once | INTRAVENOUS | Status: AC
Start: 2023-04-27 — End: 2023-04-27
  Administered 2023-04-27: 500 mL via INTRAVENOUS

## 2023-04-27 MED ORDER — LEVOFLOXACIN IN D5W 500 MG/100ML IV SOLN
500.0000 mg | INTRAVENOUS | Status: DC
Start: 2023-04-27 — End: 2023-04-29
  Administered 2023-04-27 – 2023-04-28 (×2): 500 mg via INTRAVENOUS
  Filled 2023-04-27 (×2): qty 100

## 2023-04-27 MED ORDER — SODIUM CHLORIDE 0.9 % IV BOLUS
1000.0000 mL | INJECTION | Freq: Once | INTRAVENOUS | Status: AC
Start: 2023-04-27 — End: 2023-04-27
  Administered 2023-04-27: 1000 mL via INTRAVENOUS

## 2023-04-27 MED ORDER — LIDOCAINE HCL (PF) 1 % IJ SOLN
0.3000 mL | INTRAMUSCULAR | Status: DC | PRN
Start: 2023-04-27 — End: 2023-04-28

## 2023-04-27 MED ORDER — SODIUM CHLORIDE 0.9 % IJ SOLN (CUSTOM)
3.0000 mL | Freq: Three times a day (TID) | INTRAMUSCULAR | Status: DC
Start: 2023-04-27 — End: 2023-04-29
  Administered 2023-04-27 – 2023-04-29 (×3): 3 mL via INTRAVENOUS

## 2023-04-27 MED ORDER — SODIUM CHLORIDE 0.9% TKO INFUSION
INTRAVENOUS | Status: AC | PRN
Start: 2023-04-27 — End: ?

## 2023-04-27 MED ORDER — SODIUM CHLORIDE 0.9 % IJ SOLN (CUSTOM)
3.0000 mL | INTRAMUSCULAR | Status: DC | PRN
Start: 2023-04-27 — End: 2023-04-29

## 2023-04-27 MED ORDER — SODIUM CHLORIDE 0.9 % IJ SOLN (CUSTOM)
20.0000 mL | INTRAMUSCULAR | Status: DC | PRN
Start: 2023-04-27 — End: 2023-04-28

## 2023-04-27 MED ORDER — OXYCODONE HCL 5 MG OR TABS
5.0000 mg | ORAL_TABLET | ORAL | Status: DC | PRN
Start: 2023-04-27 — End: 2023-04-29
  Administered 2023-04-28: 5 mg via ORAL
  Filled 2023-04-27: qty 1

## 2023-04-27 MED ORDER — SODIUM CHLORIDE 0.9 % IV SOLN
INTRAVENOUS | Status: AC
Start: 2023-04-27 — End: 2023-04-28

## 2023-04-27 MED ORDER — TRAZODONE HCL 50 MG OR TABS
100.0000 mg | ORAL_TABLET | Freq: Every evening | ORAL | Status: DC | PRN
Start: 2023-04-27 — End: 2023-04-28
  Filled 2023-04-27: qty 2

## 2023-04-27 MED ORDER — ONDANSETRON HCL 4 MG/2ML IV SOLN
4.0000 mg | Freq: Four times a day (QID) | INTRAMUSCULAR | Status: DC | PRN
Start: 2023-04-27 — End: 2023-04-29

## 2023-04-27 MED ORDER — OXYCODONE HCL 10 MG OR TABS
10.0000 mg | ORAL_TABLET | ORAL | Status: DC | PRN
Start: 2023-04-27 — End: 2023-04-29
  Administered 2023-04-28 – 2023-04-29 (×2): 10 mg via ORAL
  Filled 2023-04-27 (×2): qty 1

## 2023-04-27 MED ORDER — HEPARIN SODIUM (PORCINE) 5000 UNIT/ML IJ SOLN
5000.0000 [IU] | Freq: Two times a day (BID) | INTRAMUSCULAR | Status: DC
Start: 2023-04-28 — End: 2023-04-29
  Administered 2023-04-28 – 2023-04-29 (×3): 5000 [IU] via SUBCUTANEOUS
  Filled 2023-04-27 (×3): qty 1

## 2023-04-27 NOTE — ED Notes (Signed)
Patient report given to Leafy Kindle RN, assuming care for patient at this time.

## 2023-04-27 NOTE — ED Notes (Signed)
Bed: 02  Expected date:   Expected time:   Means of arrival:   Comments:  [facility], 71 M hypotension, brady

## 2023-04-27 NOTE — ED Notes (Signed)
Patient transported back from ECHO.

## 2023-04-27 NOTE — ED Notes (Signed)
Bed: 35  Expected date:   Expected time:   Means of arrival:   Comments:  2

## 2023-04-27 NOTE — ED Notes (Signed)
Cardiology at bedside.

## 2023-04-27 NOTE — Progress Notes (Deleted)
CARDIOLOGY CONSULT NOTE     Attending MD:   Campbell, Colleen Joanne*    Chief Complaint:  Hypotension, elevated troponin     Identification/History of Present Illness:     Stephen [name] is a 71 year old male with PMHx of metastatic urothelial carcinoma with osseous mets on chemotherapy (EV last 4/25, pembro last 4/18) and s/p l PCN. And ureteral stent placement, chronic pain, BPH s/p TURP, who presents from infusion center with hypotension.    Per report, patient was due for next cycle of chemotherapy however was found to be hypotensive to 40s/20s with unresponsiveness. Given 2L fluids in the ED, with BP improved to 90-100/50-60s. Cardiology consulted for elevated troponin to 202.     Per patient, he has been feeling weak for a few days and having a hard time walking. Endorses muscle aches in his thighs. Lost 45 pounds in the past few weeks. Denies LOC, lightheadedness or dizziness. Normally walks with a 4ww/cane, however largely in chair or bed for past few days. Had a fall without head strike two weeks ago without loss of consciousness. Denies other falls since then. Denies chest pain time, got flutter in chest for one second a few months ago. He last received chemotherapy on 4/25. Has a left nephrostomy tube in place, makes no urine.     At infusion center this morning found BP to be 40/25. Only occasionally measures blood pressure at home. Has not been eating very well these days, yesterday he ate meat/potatoes, had soda yesterday, does say he drank a "few glassfuls" of water yesterday.  Denies fevers/chills at home.     In room, HR 47-56, satting 100% RA, BP 124/70.      Notable labs:   Cr 2.37 (b/l 0.89 last week)   Troponin 202 -> 197  CKMB 133 -> 127   Lactate 1.2   BNP 1109 (baseline 166 in 2022)         Cardiac History:  TEE 12/2021: EF 75%   Summary:   1. The left ventricular size is normal. The left ventricular systolic function is normal.   2. The right ventricular size is normal and systolic  function is normal.   3. No evidence of any gross valvular vegetations or perivalvular abscess   4. Compared to prior study , no significant change compared with prior TTE.       Cardiac Medications:    Past Medical and Surgical History:  Past Medical History:   Diagnosis Date    Chronic back pain     Congenital hydronephrosis     Gout     Headache     Hematuria     HTN (hypertension) 12/10/2021    Kidney disease     Kidney stones     Major depressive disorder, single episode     Polyarthropathy or polyarthritis of multiple sites     Retinal detachment     Urethral stricture      Past Surgical History:   Procedure Laterality Date    CT INSERTION OF SUPRAPUBIC CATH  09/25/2015    NEPHRECTOMY Right 1955    APPENDECTOMY      COLONOSCOPY      CYSTOSCOPY      CYSTOSCOPY W/ LASER LITHOTRIPSY      OTHER SURGICAL HISTORY      Interstim 01/29/2011    SPINE SURGERY  09/21    Lumbar-sacral fusion    TRANSURETHRAL RESECTION OF PROSTATE           Allergies:  Allergies   Allergen Reactions    Sulfa Drugs Unspecified       Medications:  Current Facility-Administered Medications on File Prior to Encounter   Medication Dose Route Frequency Provider Last Rate Last Admin    heparin (HEP-LOCK) flush injection 500 Units  500 Units IntraVENOUS PRN Stewart, Tyler Francis, MD        lidocaine (XYLOCAINE) 1% PF injection 0.3 mL  0.3 mL IntraDERMAL PRN Stewart, Tyler Francis, MD        [COMPLETED] sodium chloride 0.9 % bolus 1,000 mL  1,000 mL IntraVENOUS Once Lannon, Makenzie Jean, NP 999 mL/hr at 04/27/23 1035 ED Continuing Infusion at 04/27/23 1035    [COMPLETED] sodium chloride 0.9 % bolus 500 mL  500 mL IntraVENOUS Once Lannon, Makenzie Jean, NP 999 mL/hr at 04/27/23 1035 ED Continuing Infusion at 04/27/23 1035    sodium chloride 0.9 % flush 20 mL  20 mL IntraVENOUS PRN Stewart, Tyler Francis, MD         Current Outpatient Medications on File Prior to Encounter   Medication Sig Dispense Refill    acetaminophen (TYLENOL) 500 MG tablet Take  2 tablets (1,000 mg) by mouth every 8 hours as needed for Mild Pain (Pain Score 1-3) or Moderate Pain (Pain Score 4-6). 40 tablet 0    allopurinol (ZYLOPRIM) 100 MG tablet Take 1 tablet (100 mg) by mouth daily. 30 tablet 0    diclofenac (VOLTAREN) 1 % gel Apply 2 g topically every 6 hours as needed (Right knee pain). 1 each 0    DULoxetine (CYMBALTA) 20 MG CR capsule Take 1 capsule (20 mg) by mouth daily. 90 capsule 3    [DISCONTINUED] gabapentin (NEURONTIN) 100 MG capsule Take 1 capsule (100 mg) by mouth 3 times daily for 7 days. Start taking after your procedure is completed. 21 capsule 0    ketotifen (ALAWAY) 0.025 % ophthalmic solution Place 1 drop into both eyes 2 times daily.      lidocaine (ASPERCREME) 4 % patch Apply 1 patch topically every 24 hours. Leave patch on for 12 hours, then remove for 12 hours. 30 patch 0    Multiple Vitamins-Minerals (MULTIVITAMIN WITH MINERALS) TABS tablet Take 1 tablet by mouth daily. 30 tablet 1    naloxone (NARCAN) 4 mg/0.1 mL nasal spray For suspected opioid overdose, call 911! Then spray once in one nostril. Repeat after 3 minutes if no or minimal response using a new spray in other nostril. 2 each 0    Nutritional Supplements (NEPRO) LIQD Take 3 Cans by mouth daily. 30000 mL 11    nystatin (MYCOSTATIN) 100,000 units/mL suspension 5 mL (500,000 Units) by Swish & Swallow route 4 times daily. 280 mL 0    nystatin (MYCOSTATIN) 100,000 units/mL suspension Take 5 mL (500,000 Units) by mouth 4 times daily. 280 mL 0    nystatin (MYCOSTATIN) 100000 UNIT/GM powder Apply 1 Application. topically 3 times daily. Apply to affected area 15 g 0    oxyCODONE (ROXICODONE) 10 MG tablet Take 1.5 tablets to 2 tablets (15mg to 20mg) by mouth every 4 hours as needed for pain 90 tablet 0    polyethylene glycol (MIRALAX) 17 g packet Take 1 packet (17 g) by mouth every 12 hours. 1 each 0    pregabalin (LYRICA) 100 MG capsule Take 1 capsule (100 mg) by mouth at bedtime. 30 capsule 2     prochlorperazine (COMPAZINE) 10 MG tablet Take 1 tablet (10 mg) by mouth every 6 hours as   needed (Nausea/Vomiting). 30 tablet 5    senna (SENOKOT) 8.6 MG tablet Take 2 tablets (17.2 mg) by mouth 2 times daily. 30 tablet 0    tamsulosin (FLOMAX) 0.4 MG capsule TAKE 1 CAPSULE BY MOUTH EVERY DAY 90 capsule 1    [DISCONTINUED] tamsulosin (FLOMAX) 0.4 MG capsule TAKE 1 CAPSULE BY MOUTH EVERY DAY 90 capsule 1    tiZANidine (ZANAFLEX) 4 MG tablet Take 1 tablet (4 mg) by mouth every 8 hours as needed (muscle spasm). 90 tablet 0    traZODone (DESYREL) 50 MG tablet Take 1-2 tablets (50mg to 100mg) by mouth at bedtime as needed for insomnia 60 tablet 0    triamcinolone (KENALOG) 0.1 % cream Apply 1 Application. topically 2 times daily. Apply a thin layer as directed 1 each 0       Social History:  Social History     Socioeconomic History    Marital status: Single   Tobacco Use    Smoking status: Never     Passive exposure: Never    Smokeless tobacco: Never   Vaping Use    Vaping status: Never Used   Substance and Sexual Activity    Alcohol use: No    Drug use: Not Currently    Sexual activity: Not Currently     Partners: Female   Social Activities of Daily Living Present    Military Service No    Blood Transfusions Yes    Caffeine Concern No    Occupational Exposure No    Hobby Hazards No    Sleep Concern Yes     Comment: due to my lower back discomfort and leg discomfort    Stress Concern Yes     Comment: Health/ housing/ financial    Weight Concern No    Special Diet No    Back Care Yes     Comment: Am very careful with any activity    Exercises Regularly Yes    Seat Belt Use Yes    Performs Self-Exams Yes    Bike Helmet Use No       Family History:  Family History   Adopted: Yes   Family history unknown: Yes       Review of Systems:  A 14-point review of systems is non-contributory except as noted in the HPI.    Physical Exam:  BP 104/61   Pulse (!) 47   Temp 97.5 F (36.4 C)   Resp 9   Ht 5' 6" (1.676 m)   Wt 74.1 kg  (163 lb 5.8 oz)   SpO2 100%   BMI 26.37 kg/m     General: NAD. AOx3.  HEENT: PERRL. Moist mucous membranes.  Neck: Supple, no bruits.  CV: JVP at mid neck, RRR, no m/r/g. Normal S1 and S2.  Resp: CTAB. Normal inspiratory effort.  Abd: NTND, normoactive BS.  Extr: WWP, peripheral pulses 2+BL. 2+ lower extremity edema to the level of the knees bilaterally   Neuro: Moves all extremities spontaneously. EOMI.    Labs and Other Data:  Recent Labs     04/27/23  1003 04/27/23  1048   NA 136 138   K 4.3 4.2   CL 99 100   BICARB 23 28   BUN 38* 38*   CREAT 2.35* 2.37*   [place] 9.0 8.7   TP 5.7* 5.8*   ALB 3.0* 3.2*   TBILI 0.92 0.86   AST 121* 112*   ALT 41 40   ALK 77 78         Recent Labs     04/27/23  1003 04/27/23  1048   WBC 10.5* 8.6   HGB 8.4* 8.0*   HCT 25.0* 23.6*   MCV 85.6 85.5   PLT 238 199   SEG 81.0 80.3   LYMPHS 8.7 8.6   MONOS 9.4 10.0   EOS 0.1 0.1       Recent Labs     04/27/23  1048   PTT 29   INR 1.3       Lab Results   Component Value Date    TSH 2.19 04/27/2023       No results found for: "CHOL", "HDL", "LDLCALC", "TRIG", "LDLDIRECT"    Lab Results   Component Value Date    CPK 6,231 (HH) 04/27/2023    CKMBH 133 (H) 04/27/2023    TROPONIN 202 (HH) 04/27/2023       Lab Results   Component Value Date    BNPP 1,109 (H) 04/27/2023       Baseline BNP: 166  BNP today: 1109     EKG:  On my read HR 54, no STE/STD, good R wave progression      Imaging:  PET (skull to mid-thigh) 04/26/2023:   IMPRESSION:  1. Left percutaneous nephrostomy in place. There is interval increase in now moderate left hydronephrosis.  2. Multiple small abdominopelvic lymph nodes demonstrate metabolic activity at or slightly above blood pool and remain nonspecific. In particular, a periportal lymph node demonstrates slight interval increase in size and hypermetabolic activity. Continued attention on follow-up is recommended.  3. Interval development of numerous predominantly sclerotic osseous lesions throughout the axial and proximal  appendicular skeleton. When compared with prior PET-CT 11/26/2022 many of the previous hypermetabolic lesions demonstrate interval decrease in metabolic activity and increased sclerosis, most compatible with positive response to therapy. Diffuse heterogeneous hypermetabolic activity remains throughout the axial and appendicular skeleton. This may be related to a background of reactive or treatment related process. Ongoing hypermetabolic metastasis is not excluded and continued attention on follow-up is recommended.  4. Interval development of multiple mildly hypermetabolic mediastinal and hilar lymph nodes. Findings are nonspecific and could represent a superimposed infectious or inflammatory process. However, new site of metastatic disease is not excluded and close attention on follow-up is recommended.  5. Persistent focal hypermetabolic activity associated with soft tissue prominence along the left lateral aspect of the distal sigmoid colon. Findings are in the setting of large colonic stool burden and could represent stercoral colitis. Correlate with symptoms.    CT A/P with contrast 04/20/2023:   IMPRESSION:  Worsening osseous metastatic disease, as detailed, with a new nondisplaced pathologic fracture, measuring approximately 22 mm in transverse dimension, at the left iliac wing.     Placement of a left nephrostomy tube with pigtail component in the inferior renal cortex and medulla. Mild left hydronephrosis, improved from prior. Mild pelvic and ureteral enhancement, nonspecific, may reflect pyelitis. No evidence of pyelonephritis.     Redemonstration of moderate to large-sized stool burden with a mild amount of perirectal soft tissue edema, raising the possibility of stercoral colitis.    TTE 04/27/2023:  Summary:   1. Technically difficult study with suboptimal images.   2. The left ventricular size is normal. The left ventricular systolic function is normal. Left ventricular ejection fraction by Simpson's  biplane is 57 %.   3. The right ventricular size is normal and systolic function is normal.   4. Normal pulmonary artery pressure with right ventricular systolic pressure of   22 mmHg using an estimated right atrial pressure of 3 mmHg.   5. Moderately dilated left atrium.   6. No significant valvular abnormalities.   7. Dilated aortic root measuring 4.16 cm and 2.27 cm/m index.   8. Compared to prior study no significant change.       Assessment and Plan:  # Troponinemia c/f non-MI myocardial injury  # Hypotension   Stephen [name] is a 71 year old male with PMHx of metastatic urothelial carcinoma with osseous mets on chemotherapy (EV last 4/25, pembro last 4/18) and s/p l PCN and ureteral stent placement, chronic pain, BPH s/p TURP, who presents from infusion center with hypotension. He was found to have stably elevated troponins to 202 -> 197-> 181, which is elevated from his baseline, however without any chest pain or other anginal equivalent and with EKG without evidence of acute ischemia. Does have LE edema without JVP elevation, pulmonary crackles, or O2 requirement; with his low albumin and metastatic cancer can consider anasarca related to his malnutrition. Reassuringly his TTE is also stable today suggesting non-cardiac causes of LE edema. His troponins are stable and flat concerning for chronic myocardial injury likely in the setting of hypotension, possible rhabdomyolysis with elevated CPK, acute kidney injury, possible infection.   - No indication for inpatient cardiology admission or inpatient ischemic evaluation. Given his age/comorbidities, he may have some coronary artery disease at baseline however would not pursue any ischemic workup at this time  - Appreciate medicine team admission for management of hypotension, AKI   - Cardiology will sign off         This patient was seen and discussed with the cardiology fellow Dr. Shakoor and the cardiology attending Dr. Bui.       Zakari Couchman, MD   Internal  Medicine, PGY-2

## 2023-04-27 NOTE — Interdisciplinary (Addendum)
0945 Received a cal from the Registration desk staff, states pt checked in early to FT appt at 1015. Pt is unaccompanied, on a wheelchair with walker next to pt and pt c/o dizziness.  Pt sitting upright on the wheelchair with eyes closed and did no appear to be in apparent distress.  Prepped face sheet for FT.    See APP Ronne Binning' note. IC rapid response called and attended to pt until 911 was called and brought patient to the hospital.

## 2023-04-27 NOTE — Progress Notes (Signed)
SUBJECTIVE:  Stephen [name] is a 72 year old male with urothelial cancer who comes to the Infusion Center today for Padcev and Keytruda today. The [facility] charge nurse urgently informed me that this patient was in triage and is hypotensive with a blood pressure of 45/19 mm Hg. He was immediately wheeled back to the infusion area. The patient is unaccompanied.     Prior to being wheeled back to the infusion area, a medical assistant noticed the patient was slouched over in a wheelchair, unaccompanied by anyone. He was difficult to arouse, but was able report feeling dizzy. He notified the triage nurse and vitals were immediately taken. When the patient was found to be hypotensive at 45/19 mm Hg the Fast track charge nurse was notified, who then notified me.     OBJECTIVE:    .  Vitals:    04/27/23 1011 04/27/23 1013 04/27/23 1014 04/27/23 1016   BP: (!) 90/48 (!) 82/50  (!) 84/49   BP Location: Left arm Left arm  Left arm   BP Patient Position: Other (Comment) Other (Comment)  Other (Comment)   Pulse: (!) 49 52  54   Resp: 12   15   Temp:   97.7 F (36.5 C)    TempSrc:   Temporal    SpO2: 97% 98%         GENERAL:  Ill-appearing male slouched over in wheelchair. Weak and unable to move without assistance. Eyes closed but able to open them spontaneously when instructed.  HEENT:  PERRLA. Sclerae white.  Conjunctivae pink.  Buccal mucosa is pink and moist without lesions.  Posterior oropharynx is clear of erythema or exudate.    HEART:  Regular rate and rhythm, normal S1 and S2 without murmurs. Weak pulse.   LUNGS:  Diminished bilaterally, without wheezes, crackles or rhonchi. Bradypnea with shallow breathing.   SKIN:  Skin pale and cold to touch. No suspicious rashes or lesions.  NEUROLOGICAL:  Difficult to awake, but once awake AO x 3; responsive to verbal questioning but speech garbled and difficult to understand.       LABORATORY STUDIES:   Lab Results   Component Value Date    WBC 8.6 04/27/2023    RBC 2.76  (L) 04/27/2023    HGB 8.0 (L) 04/27/2023    HCT 23.6 (L) 04/27/2023    MCV 85.5 04/27/2023    MCHC 33.9 04/27/2023    RDW 17.7 (H) 04/27/2023    PLT 199 04/27/2023    MPV 9.9 04/27/2023    SEG 80.3 04/27/2023    LYMPHS 8.6 04/27/2023    MONOS 10.0 04/27/2023    EOS 0.1 04/27/2023    BASOS 0.2 04/27/2023      Lab Results   Component Value Date    BUN 38 (H) 04/27/2023    CREAT 2.37 (H) 04/27/2023    CL 100 04/27/2023    NA 138 04/27/2023    K 4.2 04/27/2023    [place] 8.7 04/27/2023    TBILI 0.86 04/27/2023    ALB 3.2 (L) 04/27/2023    TP 5.8 (L) 04/27/2023    AST 112 (H) 04/27/2023    ALK 78 04/27/2023    BICARB 28 04/27/2023    ALT 40 04/27/2023    GLU 153 (H) 04/27/2023       ASSESSMENT AND PLAN:    When the patient arrived to the infusion area slumped over in the wheelchair, he was immediately transferred to the infusion chair via  three medical assistants; it was a full assist. He had a weak but palpable pulse, but given the patient's severe hypotension and bradypnea, 911 was called immediately.     Once in the chair, he was placed in Trendelenburg. Vitals were taken again and Normal Saline 0.9% 1000 mL fluid was started to gravity via an already accessed Port-a-Cath. It is unclear when this Port-A-Cath was accessed but the RN confirmed blood return. At this time, the patient was slow to respond to questioning and barely able to open eyes. He continued to exhibit bradypnea.    The crash cart was obtained and the AED Zoll pads applied. A PIV was inserted for a second form of access. A second Normal Saline 0.9% 500 mL bolus was ordered and started. At this point, the patient was able to open his eyes more quickly and was AO x 4. A POCT blood glucose was taken and resulted at 172 mg/dL. The patient became more alert and was able to tell me that he was dropped off via [place]. It is unclear how he got into/out of the [place] and into the building given his state. He reported feeling dizzy but denied shortness of breath or chest  pain. Vitals cycled again and he remained hypotensive.    EMS arrived at the chairside and handoff was given. ED referral entered. Patient was safely transferred to [facility] ED. Dr. Roseanne Reno made aware of events.       Leafy Ro, MS, FNP-BC  [facility] - Infusion  [facility]

## 2023-04-27 NOTE — Telephone Encounter (Signed)
Caller: Rinaldo Cloud   Relationship to patient: Sister   Phone # [phone]  Provider: Dr.Stewart   Notes:     Pt sister calling to notify MD pt will not be attending his appt scheduled for today, he is currently at the ER, unsure of the reason.     Caller has been advised of 24-48 hr turnaround time.

## 2023-04-27 NOTE — ED Notes (Signed)
Geriatric Emergency Medicine Pharmacist Consult  The following medication list has been reviewed for BEERS criteria and geriatric considerations (including, but not limited to, drug-drug, drug-disease, and drug-age interactions).  The below finding reflect medications that should typically be avoided in most older patients, medications that should be avoided in older patients with certain conditions, medications that should be used with caution because of benefits that may offset risks, medication interactions, and changes in dosing based on kidney function.  However, these recommendations reflect potentially inappropriate medications that may clinically benefit the patient and in no means are meant to be a substitution for clinical decisions made by the provider.    Current Facility-Administered Medications on File Prior to Encounter   Medication Dose Route Frequency Provider Last Rate Last Admin    heparin (HEP-LOCK) flush injection 500 Units  500 Units IntraVENOUS PRN Cheri Guppy, MD        lidocaine (XYLOCAINE) 1% PF injection 0.3 mL  0.3 mL IntraDERMAL PRN Cheri Guppy, MD        [COMPLETED] sodium chloride 0.9 % bolus 1,000 mL  1,000 mL IntraVENOUS Once Manuela Schwartz, NP 999 mL/hr at 04/27/23 1035 ED Continuing Infusion at 04/27/23 1035    [COMPLETED] sodium chloride 0.9 % bolus 500 mL  500 mL IntraVENOUS Once Manuela Schwartz, NP 999 mL/hr at 04/27/23 1035 ED Continuing Infusion at 04/27/23 1035    sodium chloride 0.9 % flush 20 mL  20 mL IntraVENOUS PRN Cheri Guppy, MD         Current Outpatient Medications on File Prior to Encounter   Medication Sig Dispense Refill    acetaminophen (TYLENOL) 500 MG tablet Take 2 tablets (1,000 mg) by mouth every 8 hours as needed for Mild Pain (Pain Score 1-3) or Moderate Pain (Pain Score 4-6). 40 tablet 0    allopurinol (ZYLOPRIM) 100 MG tablet Take 1 tablet (100 mg) by mouth daily. 30 tablet 0    diclofenac (VOLTAREN) 1 % gel  Apply 2 g topically every 6 hours as needed (Right knee pain). 1 each 0    DULoxetine (CYMBALTA) 20 MG CR capsule Take 1 capsule (20 mg) by mouth daily. 90 capsule 3    [DISCONTINUED] gabapentin (NEURONTIN) 100 MG capsule Take 1 capsule (100 mg) by mouth 3 times daily for 7 days. Start taking after your procedure is completed. 21 capsule 0    ketotifen (ALAWAY) 0.025 % ophthalmic solution Place 1 drop into both eyes 2 times daily.      lidocaine (ASPERCREME) 4 % patch Apply 1 patch topically every 24 hours. Leave patch on for 12 hours, then remove for 12 hours. 30 patch 0    Multiple Vitamins-Minerals (MULTIVITAMIN WITH MINERALS) TABS tablet Take 1 tablet by mouth daily. 30 tablet 1    naloxone (NARCAN) 4 mg/0.1 mL nasal spray For suspected opioid overdose, call 911! Then spray once in one nostril. Repeat after 3 minutes if no or minimal response using a new spray in other nostril. 2 each 0    Nutritional Supplements (NEPRO) LIQD Take 3 Cans by mouth daily. 30000 mL 11    nystatin (MYCOSTATIN) 100,000 units/mL suspension 5 mL (500,000 Units) by Swish & Swallow route 4 times daily. 280 mL 0    nystatin (MYCOSTATIN) 100,000 units/mL suspension Take 5 mL (500,000 Units) by mouth 4 times daily. 280 mL 0    nystatin (MYCOSTATIN) 100000 UNIT/GM powder Apply 1 Application. topically 3 times daily. Apply to affected area 15 g  0    oxyCODONE (ROXICODONE) 10 MG tablet Take 1.5 tablets to 2 tablets (15mg  to 20mg ) by mouth every 4 hours as needed for pain 90 tablet 0    polyethylene glycol (MIRALAX) 17 g packet Take 1 packet (17 g) by mouth every 12 hours. 1 each 0    pregabalin (LYRICA) 100 MG capsule Take 1 capsule (100 mg) by mouth at bedtime. 30 capsule 2    prochlorperazine (COMPAZINE) 10 MG tablet Take 1 tablet (10 mg) by mouth every 6 hours as needed (Nausea/Vomiting). 30 tablet 5    senna (SENOKOT) 8.6 MG tablet Take 2 tablets (17.2 mg) by mouth 2 times daily. 30 tablet 0    tamsulosin (FLOMAX) 0.4 MG capsule TAKE 1  CAPSULE BY MOUTH EVERY DAY 90 capsule 1    [DISCONTINUED] tamsulosin (FLOMAX) 0.4 MG capsule TAKE 1 CAPSULE BY MOUTH EVERY DAY 90 capsule 1    tiZANidine (ZANAFLEX) 4 MG tablet Take 1 tablet (4 mg) by mouth every 8 hours as needed (muscle spasm). 90 tablet 0    traZODone (DESYREL) 50 MG tablet Take 1-2 tablets (50mg  to 100mg ) by mouth at bedtime as needed for insomnia 60 tablet 0    triamcinolone (KENALOG) 0.1 % cream Apply 1 Application. topically 2 times daily. Apply a thin layer as directed 1 each 0       Pharmacist findings and recommendations:    Oxycodone    While they not on the BEERS medication list, opioids such as oxycodone can lead to falls and fractures in elderly patients.  If opioids are deemed required to treat the patient's pain, limited use is recommended in this age group.  A possible alternative is to use acetaminophen-only products to treat pain.       Tizanidine    Muscle relaxers are poorly tolerated by older adults and may increase risk of sedation and fractures.  Possible substitution for mild or moderate pain include acetaminophen, a non-acetylated salicylate such as salsalate, diclofenac topical (Voltaren), lidocaine  patch or lidocaine 5% ointment.     Prochlorperazine     Prochlorperazine is highly anticholinergic and should be avoided in older adults.  It  can cause confusion, delirium, agitation, hallucinations, slowed GI motility, urinary retention and constipation as well.  Ondansetron is a possible alternative and has less potential for adverse effects.     Kela Millin, PharmD  Staff Pharmacist II  [place] [facility]      References:    [facility] Abbreviated Beers Criteria for Potentially Inappropriate Medication Use in Older Adults:  https://pulse.PopTick.no.pdf    American Geriatrics Society 2019 Updated AGS Beers Criteria for Potentially Inappropriate Medication Use in Older  Adults:  ExchangeRating.si.16073

## 2023-04-27 NOTE — Plan of Care (Signed)
Problem: Promotion of Health and Safety  Goal: Promotion of Health and Safety  Description: The patient remains safe, receives appropriate treatment and achieves optimal outcomes (physically, psychosocially, and spiritually) within the limitations of the disease process by discharge.    Information below is the current care plan.  Outcome: Progressing  Note:

## 2023-04-27 NOTE — ED Provider Notes (Signed)
ED Provider Note  [facility] electronic medical record reviewed for pertinent medical history.     Stephen [name] DOB: 30-Jun-1951 PMD: Renelda L[place]     Chief Complaint   Patient presents with   . Hypotension     Hx of kidney [place]. BIBA from [facility], pt became unresponsive prior to infusion tx, initial BP 45/21, IVF bolus administered at clinic. 99/55 rpt BP upon medical arrival. Pt denies cp, sob.        HPI: Stephen [name] is a 73 year old male who has a past medical history of Chronic back pain, Congenital hydronephrosis, Gout, Headache, Hematuria, HTN (hypertension) (12/10/2021), Kidney disease, Kidney stones, Major depressive disorder, single episode, Polyarthropathy or polyarthritis of multiple sites, Retinal detachment, and Urethral stricture.  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  He has a left nephrostomy tube and has not noticed a change in output.  He has not had a cough chest pain or shortness of breath he denies headache.  Patient states he has not been able to walk the last few days he has significant bilateral lower extremity edema he has not had recent transfusions.  I did review his epic records he had a TEE done January of 2023 which was normal I also noted patient had a history of stercoral colitis earlier this month patient states he has been having normal bowel movements.          External Data Sources (Select all that apply):      Pertinent Medical History:    PMHx: As above    Past Surgical History:   Procedure Laterality Date   . CT INSERTION OF SUPRAPUBIC CATH  09/25/2015   . NEPHRECTOMY Right 1955   . APPENDECTOMY     .  COLONOSCOPY     . CYSTOSCOPY     . CYSTOSCOPY W/ LASER LITHOTRIPSY     . OTHER SURGICAL HISTORY      Interstim 01/29/2011   . SPINE SURGERY  09/21    Lumbar-sacral fusion   . TRANSURETHRAL RESECTION OF PROSTATE         Family History   Adopted: Yes   Family history unknown: Yes       Current Outpatient Medications   Medication Instructions   . acetaminophen (TYLENOL) 1,000 mg, Oral, EVERY 8 HOURS PRN   . allopurinol (ZYLOPRIM) 100 mg, Oral, DAILY   . diclofenac (VOLTAREN) 2 g, Topical, EVERY 6 HOURS PRN   . DULoxetine (CYMBALTA) 20 mg, Oral, DAILY   . ketotifen (ALAWAY) 0.025 % ophthalmic solution 1 drop, Both Eyes, 2 TIMES DAILY   . lidocaine (ASPERCREME) 4 % patch 1 patch, Transdermal, EVERY 24 HOURS, Leave patch on for 12 hours, then remove for 12 hours.   . Multiple Vitamins-Minerals (MULTIVITAMIN WITH MINERALS) TABS tablet 1 tablet, Oral, DAILY   . naloxone (NARCAN) 4 mg/0.1 mL nasal spray For suspected opioid overdose, call 911! Then spray once in one nostril. Repeat after 3 minutes if no or minimal response using a new spray in other nostril.   . Nutritional Supplements (NEPRO) LIQD 3 Cans, Oral,  DAILY   . nystatin (MYCOSTATIN) 100000 UNIT/GM powder 1 Application., Topical, 3 TIMES DAILY, Apply to affected area   . nystatin (MYCOSTATIN) 500,000 Units, Oral, 4 TIMES DAILY   . nystatin (MYCOSTATIN) 500,000 Units, Swish & Swallow, 4 TIMES DAILY   . oxyCODONE (ROXICODONE) 10 MG tablet Take 1.5 tablets to 2 tablets (15mg  to 20mg ) by mouth every 4 hours as needed for pain   . polyethylene glycol (MIRALAX) 17 g, Oral, EVERY 12 HOURS   . pregabalin (LYRICA) 100 mg, Oral, AT BEDTIME   . prochlorperazine (COMPAZINE) 10 mg, Oral, EVERY 6 HOURS PRN   . senna (SENOKOT) 17.2 mg, Oral, 2 TIMES DAILY   . tamsulosin (FLOMAX) 0.4 MG capsule TAKE 1 CAPSULE BY MOUTH EVERY DAY   . tiZANidine (ZANAFLEX) 4 mg, Oral, EVERY 8 HOURS PRN   . traZODone (DESYREL) 50 MG tablet Take 1-2 tablets (50mg  to 100mg ) by mouth at bedtime as needed  for insomnia   . triamcinolone (KENALOG) 0.1 % cream 1 Application., Topical, 2 TIMES DAILY, Apply a thin layer as directed       Physical Exam  BP 98/61   Pulse 54   Temp 97.5 F (36.4 C)   Resp 13   Ht 5\' 6"  (1.676 m)   Wt 74.1 kg (163 lb 5.8 oz)   SpO2 100%   BMI 26.37 kg/m   Physical Exam      pe stable 71 year oldmale BP 98/61   Pulse 54   Temp 97.5 F (36.4 C)   Resp 13   Ht 5\' 6"  (1.676 m)   Wt 74.1 kg (163 lb 5.8 oz)   SpO2 100%   BMI 26.37 kg/m  noted wnl sao2 wnl  Lungs ctab no wheezing no rales, no rhonchi pale mucous membranes  Heart s1, s2 rrr no murmur  abd soft bs pos,nt,nm,nr,ng,nd  Throat wnl  Eyes anict 4-2 perrla eom intact  Left nephrostomy tube in place yellow urine noted.   Skin no rashes  Neck trachea mildine , no nodes no midline pain  Back tender diffuse l spine no flank pain  Ext b 3+ edema  Neuro face symmetric,   b upper and lower ext intact, symmetric    PET/CT Skull To Mid Thigh (Non Diag [facility])    Result Date: 04/26/2023  IMPRESSION: 1. Left percutaneous nephrostomy in place. There is interval increase in now moderate left hydronephrosis. 2. Multiple small abdominopelvic lymph nodes demonstrate metabolic activity at or slightly above blood pool and remain nonspecific. In particular, a periportal lymph node demonstrates slight interval increase in size and hypermetabolic activity. Continued attention on follow-up is recommended. 3. Interval development of numerous predominantly sclerotic osseous lesions throughout the axial and proximal appendicular skeleton. When compared with prior PET-CT 11/26/2022 many of the previous hypermetabolic lesions demonstrate interval decrease in metabolic activity and increased sclerosis, most compatible with positive response to therapy. Diffuse heterogeneous hypermetabolic activity remains throughout the axial and appendicular skeleton. This may be related to a background of reactive or treatment related process. Ongoing  hypermetabolic metastasis is not excluded and continued attention on follow-up is recommended. 4. Interval development of multiple mildly hypermetabolic mediastinal and hilar lymph nodes. Findings are nonspecific and could represent a superimposed infectious or inflammatory process. However, new site of metastatic disease is not excluded and close attention on follow-up is recommended. 5. Persistent focal hypermetabolic activity associated with soft tissue prominence along the left lateral aspect of the distal sigmoid colon. Findings are  in the setting of large colonic stool burden and could represent stercoral colitis. Correlate with symptoms.     CT Abdomen And Pelvis With Contrast    Result Date: 04/20/2023  IMPRESSION: Worsening osseous metastatic disease, as detailed, with a new nondisplaced pathologic fracture, measuring approximately 22 mm in transverse dimension, at the left iliac wing. Placement of a left nephrostomy tube with pigtail component in the inferior renal cortex and medulla. Mild left hydronephrosis, improved from prior. Mild pelvic and ureteral enhancement, nonspecific, may reflect pyelitis. No evidence of pyelonephritis. Redemonstration of moderate to large-sized stool burden with a mild amount of perirectal soft tissue edema, raising the possibility of stercoral colitis. Important but non urgent findings/recommendations delineated above were verbally communicated by Charolett Bumpers to the care provider, [place], at 22:51 on 04/20/2023.   CTRM:2003:verbal.     MRI Lumbar Spine W/WO IV Contrast    Result Date: 04/17/2023  IMPRESSION: Since the previous examination of 12/03/2021 multiple discrete osseous lesions have developed within the spine and pelvis. Multiple lesions are noted in the ileum bilaterally. Vertebral lesions are noted in the T11, T12 and L1 multiple lesions are noted.  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     Results for  orders placed or performed during the hospital encounter of 04/27/23   CBC w/ Diff Lavender   Result Value Ref Range    WBC 10.5 (H) 4.0 - 10.0 1000/mm3    RBC 2.92 (L) 4.60 - 6.10 mill/mm3    Hgb 8.4 (L) 13.7 - 17.5 gm/dL    Hct 16.1 (L) 09.6 - 50.0 %    MCV 85.6 79.0 - 95.0 um3    MCH 28.8 26.0 - 32.0 pgm    MCHC 33.6 32.0 - 36.0 g/dL    RDW 04.5 (H) 40.9 - 14.0 %    MPV 9.5 9.4 - 12.4 fL    Plt Count 238 140 - 370 1000/mm3    ANC-Instrument 8.5 (H) 1.6 - 7.0 1000/mm3    Segs 81.0 %    Imm Gran % 0.6 <1 %    Lymphocytes 8.7 %    Monocytes 9.4 %    Eosinophils 0.1 %    Basophils 0.2 %    ANC-Automated 8.5 (H) 1.6 - 7.0 1000/mm3    Imm Gran Abs 0.1 (H) <0.1 1000/mm3    Abs Lymphs 0.9 0.8 - 3.1 1000/mm3    Abs Monos 1.0 (H) 0.2 - 0.8 1000/mm3    Abs Eosinophils 0.0 0.0 - 0.5 1000/mm3    Abs Basophils 0.0 <0.2 1000/mm3    Diff Type Automated          Orders/Medications    Orders Placed This Encounter   Procedures   . CMP   . CBC w/ Diff [place]   . aPTT, Blood Blue   . Prothrombin Time, Blood Blue   . Lactate, Blood - See Instructions   . Troponin T Gen 5 w/Reflex to CK/CKMB Green Plasma Separator Tube   . Troponin T Gen 5 w/Reflex to CK/CKMB Green Plasma Separator Tube   . Troponin T Gen 5 w/Reflex to CK/CKMB Green Plasma Separator Tube   . CPK, Blood Green Plasma Separator Tube   . Pro Bnp, Blood Green Plasma Separator Tube   . Urinalysis with Culture Reflex, when indicated   . ECG 12 Lead   . ECG 12 Lead       Medications   sodium chloride 0.9 % bolus 1,000  mL (1,000 mL IntraVENOUS New Bag 04/27/23 1100)       Medical Decision Making/Assessment/Plan    This is a(n) 72 year old male who has a past medical history of Chronic back pain, Congenital hydronephrosis, Gout, Headache, Hematuria, HTN (hypertension) (12/10/2021), Kidney disease, Kidney stones, Major depressive disorder, single episode, Polyarthropathy or polyarthritis of multiple sites, Retinal detachment, and Urethral stricture. and presents with a chief  complaint of severe hypotension differential includes sepsis bleeding a severe anemia he has no abdominal pain or chest pain to suggest ACS but we will screen for this as well.  Differential is broad at this point and we will await labs to further define her workup.      ED Course/Updates/Disposition  ED Course as of 04/27/23 1422   Quin Hoop [facility] Documentation   Thu Apr 27, 2023   1226 Hgb at baseline noted high troponin and aki will hydrateekg with sinus brady rate is 54 no acute st elev or dep         ED Clinical Impression as of 04/27/23 1422   Non-traumatic rhabdomyolysis   Urothelial cancer (CMS-HCC)       CMS-HCC/Risk Adjustment Factor Diagnoses       Needs Recertification        Category and Diagnosis From    CMS-HCC 47:  Immunodeficiency, unspecified (CMS-HCC) Miles Costain, MD on 01/10/2022    CMS-HCC 55:  Opioid overdose (CMS-HCC) [facility] on 01/29/2023    CMS-HCC 59:  Major depressive disorder, recurrent, unspecified (CMS-HCC) [place] [facility] [employer]    CMS-HCC 72:  Other specified diseases of spinal cord (CMS-HCC) Zlomislic, Vinko, MD on 05/07/2022    CMS-HCC 188:  Encounter for attention to other artificial openings of urinary tract (CMS-HCC) Cristy Hilts, MD on 11/26/2022                               Data Reviewed:        Risk of Complications and/or Morbidity:               Marissa Calamity, MD  04/27/23 1113

## 2023-04-27 NOTE — Consults (Signed)
CARDIOLOGY CONSULT NOTE     Attending MD:   Marissa Calamity*    Chief Complaint:  Hypotension, elevated troponin     Identification/History of Present Illness:     Stephen [name] is a 72 year old male with PMHx of metastatic urothelial carcinoma with osseous mets on chemotherapy (EV last 4/25, pembro last 4/18) and s/p l PCN. And ureteral stent placement, chronic pain, BPH s/p TURP, who presents from infusion center with hypotension.    Per report, patient was due for next cycle of chemotherapy however was found to be hypotensive to 40s/20s with unresponsiveness. Given 2L fluids in the ED, with BP improved to 90-100/50-60s. Cardiology consulted for elevated troponin to 202.     Per patient, he has been feeling weak for a few days and having a hard time walking. Endorses muscle aches in his thighs. Lost 45 pounds in the past few weeks. Denies LOC, lightheadedness or dizziness. Normally walks with a 4ww/cane, however largely in chair or bed for past few days. Had a fall without head strike two weeks ago without loss of consciousness. Denies other falls since then. Denies chest pain time, got flutter in chest for one second a few months ago. He last received chemotherapy on 4/25. Has a left nephrostomy tube in place, makes no urine.     At infusion center this morning found BP to be 40/25. Only occasionally measures blood pressure at home. Has not been eating very well these days, yesterday he ate meat/potatoes, had soda yesterday, does say he drank a "few glassfuls" of water yesterday.  Denies fevers/chills at home.     In room, HR 47-56, satting 100% RA, BP 124/70.      Notable labs:   Cr 2.37 (b/l 0.89 last week)   Troponin 202 -> 197  CKMB 133 -> 127   Lactate 1.2   BNP 1109 (baseline 166 in 2022)         Cardiac History:  TEE 12/2021: EF 75%   Summary:   1. The left ventricular size is normal. The left ventricular systolic function is normal.   2. The right ventricular size is normal and systolic  function is normal.   3. No evidence of any gross valvular vegetations or perivalvular abscess   4. Compared to prior study , no significant change compared with prior TTE.       Cardiac Medications:    Past Medical and Surgical History:  Past Medical History:   Diagnosis Date    Chronic back pain     Congenital hydronephrosis     Gout     Headache     Hematuria     HTN (hypertension) 12/10/2021    Kidney disease     Kidney stones     Major depressive disorder, single episode     Polyarthropathy or polyarthritis of multiple sites     Retinal detachment     Urethral stricture      Past Surgical History:   Procedure Laterality Date    CT INSERTION OF SUPRAPUBIC CATH  09/25/2015    NEPHRECTOMY Right 1955    APPENDECTOMY      COLONOSCOPY      CYSTOSCOPY      CYSTOSCOPY W/ LASER LITHOTRIPSY      OTHER SURGICAL HISTORY      Interstim 01/29/2011    SPINE SURGERY  09/21    Lumbar-sacral fusion    TRANSURETHRAL RESECTION OF PROSTATE  Allergies:  Allergies   Allergen Reactions    Sulfa Drugs Unspecified       Medications:  Current Facility-Administered Medications on File Prior to Encounter   Medication Dose Route Frequency Provider Last Rate Last Admin    heparin (HEP-LOCK) flush injection 500 Units  500 Units IntraVENOUS PRN Cheri Guppy, MD        lidocaine (XYLOCAINE) 1% PF injection 0.3 mL  0.3 mL IntraDERMAL PRN Cheri Guppy, MD        [COMPLETED] sodium chloride 0.9 % bolus 1,000 mL  1,000 mL IntraVENOUS Once Manuela Schwartz, NP 999 mL/hr at 04/27/23 1035 ED Continuing Infusion at 04/27/23 1035    [COMPLETED] sodium chloride 0.9 % bolus 500 mL  500 mL IntraVENOUS Once Manuela Schwartz, NP 999 mL/hr at 04/27/23 1035 ED Continuing Infusion at 04/27/23 1035    sodium chloride 0.9 % flush 20 mL  20 mL IntraVENOUS PRN Cheri Guppy, MD         Current Outpatient Medications on File Prior to Encounter   Medication Sig Dispense Refill    acetaminophen (TYLENOL) 500 MG tablet Take  2 tablets (1,000 mg) by mouth every 8 hours as needed for Mild Pain (Pain Score 1-3) or Moderate Pain (Pain Score 4-6). 40 tablet 0    allopurinol (ZYLOPRIM) 100 MG tablet Take 1 tablet (100 mg) by mouth daily. 30 tablet 0    diclofenac (VOLTAREN) 1 % gel Apply 2 g topically every 6 hours as needed (Right knee pain). 1 each 0    DULoxetine (CYMBALTA) 20 MG CR capsule Take 1 capsule (20 mg) by mouth daily. 90 capsule 3    [DISCONTINUED] gabapentin (NEURONTIN) 100 MG capsule Take 1 capsule (100 mg) by mouth 3 times daily for 7 days. Start taking after your procedure is completed. 21 capsule 0    ketotifen (ALAWAY) 0.025 % ophthalmic solution Place 1 drop into both eyes 2 times daily.      lidocaine (ASPERCREME) 4 % patch Apply 1 patch topically every 24 hours. Leave patch on for 12 hours, then remove for 12 hours. 30 patch 0    Multiple Vitamins-Minerals (MULTIVITAMIN WITH MINERALS) TABS tablet Take 1 tablet by mouth daily. 30 tablet 1    naloxone (NARCAN) 4 mg/0.1 mL nasal spray For suspected opioid overdose, call 911! Then spray once in one nostril. Repeat after 3 minutes if no or minimal response using a new spray in other nostril. 2 each 0    Nutritional Supplements (NEPRO) LIQD Take 3 Cans by mouth daily. 30000 mL 11    nystatin (MYCOSTATIN) 100,000 units/mL suspension 5 mL (500,000 Units) by Swish & Swallow route 4 times daily. 280 mL 0    nystatin (MYCOSTATIN) 100,000 units/mL suspension Take 5 mL (500,000 Units) by mouth 4 times daily. 280 mL 0    nystatin (MYCOSTATIN) 100000 UNIT/GM powder Apply 1 Application. topically 3 times daily. Apply to affected area 15 g 0    oxyCODONE (ROXICODONE) 10 MG tablet Take 1.5 tablets to 2 tablets (15mg  to 20mg ) by mouth every 4 hours as needed for pain 90 tablet 0    polyethylene glycol (MIRALAX) 17 g packet Take 1 packet (17 g) by mouth every 12 hours. 1 each 0    pregabalin (LYRICA) 100 MG capsule Take 1 capsule (100 mg) by mouth at bedtime. 30 capsule 2     prochlorperazine (COMPAZINE) 10 MG tablet Take 1 tablet (10 mg) by mouth every 6 hours as  needed (Nausea/Vomiting). 30 tablet 5    senna (SENOKOT) 8.6 MG tablet Take 2 tablets (17.2 mg) by mouth 2 times daily. 30 tablet 0    tamsulosin (FLOMAX) 0.4 MG capsule TAKE 1 CAPSULE BY MOUTH EVERY DAY 90 capsule 1    [DISCONTINUED] tamsulosin (FLOMAX) 0.4 MG capsule TAKE 1 CAPSULE BY MOUTH EVERY DAY 90 capsule 1    tiZANidine (ZANAFLEX) 4 MG tablet Take 1 tablet (4 mg) by mouth every 8 hours as needed (muscle spasm). 90 tablet 0    traZODone (DESYREL) 50 MG tablet Take 1-2 tablets (50mg  to 100mg ) by mouth at bedtime as needed for insomnia 60 tablet 0    triamcinolone (KENALOG) 0.1 % cream Apply 1 Application. topically 2 times daily. Apply a thin layer as directed 1 each 0       Social History:  Social History     Socioeconomic History    Marital status: Single   Tobacco Use    Smoking status: Never     Passive exposure: Never    Smokeless tobacco: Never   Vaping Use    Vaping status: Never Used   Substance and Sexual Activity    Alcohol use: No    Drug use: Not Currently    Sexual activity: Not Currently     Partners: Female   Social Activities of Daily Living Present    Military Service No    Blood Transfusions Yes    Caffeine Concern No    Occupational Exposure No    Hobby Hazards No    Sleep Concern Yes     Comment: due to my lower back discomfort and leg discomfort    Stress Concern Yes     Comment: Health/ housing/ financial    Weight Concern No    Special Diet No    Back Care Yes     Comment: Am very careful with any activity    Exercises Regularly Yes    Seat Belt Use Yes    Performs Self-Exams Yes    Bike Helmet Use No       Family History:  Family History   Adopted: Yes   Family history unknown: Yes       Review of Systems:  A 14-point review of systems is non-contributory except as noted in the HPI.    Physical Exam:  BP 104/61   Pulse (!) 47   Temp 97.5 F (36.4 C)   Resp 9   Ht 5\' 6"  (1.676 m)   Wt 74.1 kg  (163 lb 5.8 oz)   SpO2 100%   BMI 26.37 kg/m     General: NAD. AOx3.  HEENT: PERRL. Moist mucous membranes.  Neck: Supple, no bruits.  CV: JVP at mid neck, RRR, no m/r/g. Normal S1 and S2.  Resp: CTAB. Normal inspiratory effort.  Abd: NTND, normoactive BS.  Extr: WWP, peripheral pulses 2+BL. 2+ lower extremity edema to the level of the knees bilaterally   Neuro: Moves all extremities spontaneously. EOMI.    Labs and Other Data:  Recent Labs     04/27/23  1003 04/27/23  1048   NA 136 138   K 4.3 4.2   CL 99 100   BICARB 23 28   BUN 38* 38*   CREAT 2.35* 2.37*   [place] 9.0 8.7   TP 5.7* 5.8*   ALB 3.0* 3.2*   TBILI 0.92 0.86   AST 121* 112*   ALT 41 40   ALK 77 78  Recent Labs     04/27/23  1003 04/27/23  1048   WBC 10.5* 8.6   HGB 8.4* 8.0*   HCT 25.0* 23.6*   MCV 85.6 85.5   PLT 238 199   SEG 81.0 80.3   LYMPHS 8.7 8.6   MONOS 9.4 10.0   EOS 0.1 0.1       Recent Labs     04/27/23  1048   PTT 29   INR 1.3       Lab Results   Component Value Date    TSH 2.19 04/27/2023       No results found for: "CHOL", "HDL", "LDLCALC", "TRIG", "LDLDIRECT"    Lab Results   Component Value Date    CPK 6,231 (HH) 04/27/2023    CKMBH 133 (H) 04/27/2023    TROPONIN 202 (HH) 04/27/2023       Lab Results   Component Value Date    BNPP 1,109 (H) 04/27/2023       Baseline BNP: 166  BNP today: 1109     EKG:  On my read HR 54, no STE/STD, good R wave progression      Imaging:  PET (skull to mid-thigh) 04/26/2023:   IMPRESSION:  1. Left percutaneous nephrostomy in place. There is interval increase in now moderate left hydronephrosis.  2. Multiple small abdominopelvic lymph nodes demonstrate metabolic activity at or slightly above blood pool and remain nonspecific. In particular, a periportal lymph node demonstrates slight interval increase in size and hypermetabolic activity. Continued attention on follow-up is recommended.  3. Interval development of numerous predominantly sclerotic osseous lesions throughout the axial and proximal  appendicular skeleton. When compared with prior PET-CT 11/26/2022 many of the previous hypermetabolic lesions demonstrate interval decrease in metabolic activity and increased sclerosis, most compatible with positive response to therapy. Diffuse heterogeneous hypermetabolic activity remains throughout the axial and appendicular skeleton. This may be related to a background of reactive or treatment related process. Ongoing hypermetabolic metastasis is not excluded and continued attention on follow-up is recommended.  4. Interval development of multiple mildly hypermetabolic mediastinal and hilar lymph nodes. Findings are nonspecific and could represent a superimposed infectious or inflammatory process. However, new site of metastatic disease is not excluded and close attention on follow-up is recommended.  5. Persistent focal hypermetabolic activity associated with soft tissue prominence along the left lateral aspect of the distal sigmoid colon. Findings are in the setting of large colonic stool burden and could represent stercoral colitis. Correlate with symptoms.    CT A/P with contrast 04/20/2023:   IMPRESSION:  Worsening osseous metastatic disease, as detailed, with a new nondisplaced pathologic fracture, measuring approximately 22 mm in transverse dimension, at the left iliac wing.     Placement of a left nephrostomy tube with pigtail component in the inferior renal cortex and medulla. Mild left hydronephrosis, improved from prior. Mild pelvic and ureteral enhancement, nonspecific, may reflect pyelitis. No evidence of pyelonephritis.     Redemonstration of moderate to large-sized stool burden with a mild amount of perirectal soft tissue edema, raising the possibility of stercoral colitis.    TTE 04/27/2023:  Summary:   1. Technically difficult study with suboptimal images.   2. The left ventricular size is normal. The left ventricular systolic function is normal. Left ventricular ejection fraction by Simpson's  biplane is 57 %.   3. The right ventricular size is normal and systolic function is normal.   4. Normal pulmonary artery pressure with right ventricular systolic pressure of  22 mmHg using an estimated right atrial pressure of 3 mmHg.   5. Moderately dilated left atrium.   6. No significant valvular abnormalities.   7. Dilated aortic root measuring 4.16 cm and 2.27 cm/m index.   8. Compared to prior study no significant change.       Assessment and Plan:  # Troponinemia c/f non-MI myocardial injury  # Hypotension   Stephen [name] is a 72 year old male with PMHx of metastatic urothelial carcinoma with osseous mets on chemotherapy (EV last 4/25, pembro last 4/18) and s/p l PCN and ureteral stent placement, chronic pain, BPH s/p TURP, who presents from infusion center with hypotension. He was found to have stably elevated troponins to 202 -> 197-> 181, which is elevated from his baseline, however without any chest pain or other anginal equivalent and with EKG without evidence of acute ischemia. Does have LE edema without JVP elevation, pulmonary crackles, or O2 requirement; with his low albumin and metastatic cancer can consider anasarca related to his malnutrition. Reassuringly his TTE is also stable today suggesting non-cardiac causes of LE edema. His troponins are stable and flat concerning for chronic myocardial injury likely in the setting of hypotension, possible rhabdomyolysis with elevated CPK, acute kidney injury, possible infection.   - No indication for inpatient cardiology admission or inpatient ischemic evaluation. Given his age/comorbidities, he may have some coronary artery disease at baseline however would not pursue any ischemic workup at this time  - Appreciate medicine team admission for management of hypotension, AKI   - Cardiology will sign off         This patient was seen and discussed with the cardiology fellow Dr. Jeanmarie Plant and the cardiology attending Dr. Daivd Council.       Darrelyn Hillock, MD   Internal  Medicine, PGY-2

## 2023-04-27 NOTE — ED Geriatric Nursing Note (Signed)
Chief Complaint   Stephen [name] is a 72 year old year old male that presented to the Emergency Department today with the chief complaint of: Hypotension (Hx of kidney [place]. BIBA from [facility] [place] center, pt became unresponsive prior to infusion tx, initial BP 45/21, IVF bolus administered at clinic. 99/55 rpt BP upon medical arrival. Pt denies cp, sob. )     Pertinent Medical History   Past Medical History:   Diagnosis Date    Chronic back pain     Congenital hydronephrosis     Gout     Headache     Hematuria     HTN (hypertension) 12/10/2021    Kidney disease     Kidney stones     Major depressive disorder, single episode     Polyarthropathy or polyarthritis of multiple sites     Retinal detachment     Urethral stricture       Emergency Contact   Extended Emergency Contact Information  Primary Emergency Contact: Evert,Pamela-Sister   [place]  Home Phone: [phone]  Work Phone: [phone]  Mobile Phone: [phone]  Relation: SISTER  Other needs: None  Preferred language: English  Interpreter needed? No   PCP   Renelda Loma - [phone]   Consent   [name] gave verbal consent to comprehensive geriatric assessment. The Emergency Department Attending Provider is aware of geriatric screening.       Their ISAR (Identify Seniors At Risk) score today was ISAR Score (Scores 2 or higher indicate High Risk): 4.    A score of 2 or > means if they are discharged home, it could result in a poor outcome.         The framework guides the geriatric nursing assessment: Mobility, Mentation, Medications, what Matters.     Home Environment    Who do you live with? Family Member   Type of Residence: Private Home   Are there stairs in the home? No   Suspicion of Abuse    Elder Abuse Suspicion Index (EASI) NEGATIVE  A positive EASI should be reported to APS and/or Child psychotherapist.     : Mobility    Do you use any DME:     Yes; Walker   Have you had any falls in the past 3 months?   No    GUG Score= GUG Score: (!) 4 - Moderately abnormal  Consider a Physical Therapy Consult for a patient with a GUG Score of 3 or higher.   Physical Therapy Referral? Yes   Pt provided with a walker or cane? No   KATZ ADLs Activities of Daily Living   Bathing Bathing: 0 - Dependence   Dressing Dressing: 0 - Dependence   Toileting Toileting: 0 - Dependence   Transferring Transferring: 0 - Dependence   Continence Continence: 0 - Dependence   Feeding Feeding: 1 - Independence   KATZ Score KATZ Total (Score of 3 or less is Abnormal): 1           IADL's Instrumental Activities of Daily Living   Ability to Use Telephone:   Independent   Shopping:   Dependent   Food Preparation:   Dependent   Housekeeping:   Dependent   Laundry:   Dependent   Mode of Transportation:   Dependent   Responsible for Own Medications:   Dependent   Ability to Handle Finances:   Dependent     Caregiver Strain    Caregiver Strain  Score     Social Work referral? No   Caregiver Gender Male sister and grand daughter   : Medications    BEERS List Medication(s)  Roxicodone compazine zanaflex desyrel   Pharmacy Referral Yes   : Mentation    Dementia: AMT4 Score  Abbreviated Mental Test 4 AMT4 Score: 4  A score of <4 suggests possible cognitive impairment   Alzheimer's: BAS  Brief Alzheimer's Screen  Data not collected  A score of <23 suggests possible cognitive impairment   Delirium: CAM-ICU  Confusion Assessment Method for the Intensive Care Unit NEGATIVE   Depression: PHQ2 & PHQ9  Patient Health Questionnaire PHQ2 Total: 0  A score of 3+ suggests possible depressive disorder.     Total Score               Depression Severity        1-4                       Minimal depression        5-9                       Mild depression       10-14                    Moderate depression       15-19                    Mod/severe depress       20-27                    Severe depression   Referral None   Nutrition    Last six (6) documented weights     04/27/2023     10:40 AM 04/20/2023     1:26 PM 04/13/2023     4:16 PM 04/13/2023     3:02 PM 04/10/2023    12:57 PM 04/06/2023     4:33 PM   Date Weight Recorded   Metric 74.1 kg 73.483 kg 71.714 kg 71.714 kg 71.487 kg 75.2 kg   Pounds/Ounces 163 lb 5.8 oz 162 lb 158 lb 1.6 oz 158 lb 1.6 oz 157 lb 9.6 oz 165 lb 12.6 oz      MNA Score  Mini Nutritional Assessment MNA Score (8 or less is abnormal): 12  Consider a Nutrition Consult when MNA Score < 11   Referral None   : What Matters    What Matters  Example: What matters the most to you? What does a good day look like for you? What health goal is most important to you? To feel better   PPS Score Palliative performance scale : 50%   Disposition Admit   GENIE Referrals    Referrals today Physical Therapy, Pharmacy, and GENIE   PCP Updated? No               GENIE Communication   Mentation  Met pt upon arrival to the ED  He was confused about the details that caused him to be brought to the ED.    Mobility  Pt states he needs a walker and has help at home from his sister.  Pt states he is always dehydrated.   Referral   to In patient physical therapy.    Weight  He states he has lost a lot of weight because his bottom dentures don't fit well  and he cannot eat with them.      Pt very disappointed he missed his infusion today and his chance to speak to his oncologist Dr Gabriel Earing   Plan is to be admitted per Dr Orvan Falconer  Pt in agreement             Screened by   Geriatric Emergency Nurse Initiative Expert (GENIE)  Amparo Bristol, RN  [facility]  Emergency Department - [place]  04/27/2023 8:16 PM

## 2023-04-27 NOTE — H&P (Signed)
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 Roseanne Reno), CKD, BPH s/p TURP, gout and insomnia with recent OSH hospitalization from 5/02-5/03/2023 for diarrhea, who was referred to ED from infusion center for hypotension ( BP 45/21).    Per ED RN, pt was noted to have systolic in the 40s with bradycardia after checking in for chemo-immunotherapy infusion, reportedly unresponsive, received fluid bolus with improvement of systolic to 90s. Pt endorsing generalized weakness. States that he has been unable to ambulate ( with walker) for 2 days, endorsing bilateral lower extremity soreness for 2 days. Also endorsing poor PO intake, normally has dentures but currently not using, tolerates mechanical soft diet but states that he has not been eating much for weeks.     Pt reports that his left nephrostomy has been draining normally, denies flank pain.       ED Course:  Vitals: Afebrile, HR 47-->58, BP 96/59-->124/70, no hypoxia  Labs: Cr 2.37, CPK 6231-->5992, BNPP 1109, lactate 1.2, Trop 202-->197, hgb 8.0  Meds: NS 2L bolus  Consults: Cardiology      ( Per outpatient urology notes)  Oncologic History:  04/22/21  CTU: filling defect in L renal pelvis  05/05/21  Cysto with L ureteroscopy revealed a L papillary tumor, biopsied: mixed high (30%) and low grade urothelial carcinoma  06/16/21  Repeat cysto, papillary tumor: pathology low grade papillary [place]  09/xx/22  Completed induction with mitogel (6 cycles)  08/19/21  HGTa of the bladder, no muscle  08/26/21  Repeat TURBT: NED, muscle identified  10/26/21  MRU: mild thickening in the L renal  pelvis  12/24/21  Cysto/L ureteroscopy: dysplasia in the bladder; no disease in L ureter  05/06/22  L PCN placed  06/01/22  TURBT and L ureteroscopy: NED, stricture in place, stent placed  09/29/22  TURBT and L ureteroscopy: NED  10/22/22  MRU: Mild diffuse thickening in the L ureter/renal pelvis. Multiple osseous lesions involving the bilateral iliac crest and right acetabulum concerning for osseous metastatic disease.   11/26/22  PET/CT: diffuse osseous FDG activity with discrete osseous lesions concerning for metastatic disease.  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  Also treated for septic shock, AKI (Cr 2.5) and acute liver injury (AST/ALT 2800/2100).   S/p exchange L PCN.   02/07/23  start XRT to R Shoulder  02/10/23  C2D1 EV (dose reduced to 1.0 mg/kg) + pembro 200 mg IV  03/03/23  C3D1 EV/pembro, missed D8  03/06/23  Cysto: NED.  Cytology NED.   03/14/23  Seen at [facility] ED for dislodged tube, replaced  04/02-05/24  Admitted with weakness, poor PO intake.   04/06/23  C4D1 EV/pembro  04/13/23  C4D8 EV      Past Medical History:  Past Medical History:   Diagnosis Date    Chronic back pain     Congenital hydronephrosis     Gout     Headache     Hematuria     HTN (hypertension) 12/10/2021    Kidney disease     Kidney stones     Major depressive disorder, single episode     Polyarthropathy or polyarthritis of multiple sites     Retinal detachment     Urethral stricture        Past Surgical History:  Past Surgical History:   Procedure Laterality Date    CT INSERTION OF SUPRAPUBIC CATH  09/25/2015    NEPHRECTOMY Right 1955    APPENDECTOMY      COLONOSCOPY      CYSTOSCOPY      CYSTOSCOPY W/ LASER LITHOTRIPSY      OTHER  SURGICAL HISTORY      Interstim 01/29/2011    SPINE SURGERY  09/21    Lumbar-sacral fusion    TRANSURETHRAL RESECTION OF PROSTATE         Allergies:  Allergies   Allergen Reactions    Sulfa Drugs Unspecified       Medications:  Current Facility-Administered Medications on File Prior to Encounter   Medication    heparin (HEP-LOCK) flush injection 500 Units    lidocaine (XYLOCAINE) 1% PF injection 0.3 mL    [COMPLETED] sodium chloride 0.9 % bolus 1,000 mL    [COMPLETED] sodium chloride 0.9 % bolus 500 mL    sodium chloride 0.9 % flush 20 mL     Current Outpatient Medications on File Prior to Encounter   Medication Sig    acetaminophen (TYLENOL) 500 MG tablet Take 2 tablets (1,000 mg) by mouth every 8 hours as needed for Mild Pain (Pain Score 1-3) or Moderate Pain (Pain Score 4-6).    allopurinol (ZYLOPRIM) 100 MG tablet Take 1 tablet (100 mg) by mouth daily.    diclofenac (VOLTAREN) 1 % gel Apply 2 g topically every 6 hours as needed (Right knee pain).    DULoxetine (CYMBALTA) 20 MG CR capsule Take 1 capsule (20 mg) by mouth daily.    [DISCONTINUED] gabapentin (NEURONTIN) 100 MG capsule Take 1 capsule (100 mg) by mouth 3 times daily for 7 days. Start taking after your procedure is completed.    ketotifen (ALAWAY) 0.025 % ophthalmic solution Place 1 drop into both eyes 2 times daily.    lidocaine (ASPERCREME) 4 % patch Apply 1 patch topically every 24 hours. Leave patch on for 12 hours, then remove for 12 hours.    Multiple Vitamins-Minerals (MULTIVITAMIN WITH MINERALS) TABS tablet Take 1 tablet by mouth daily.    naloxone (NARCAN) 4 mg/0.1 mL nasal spray For suspected opioid overdose, call 911! Then spray once in one nostril. Repeat after 3 minutes if no or minimal response using a new spray in other nostril.    Nutritional Supplements (NEPRO) LIQD Take 3 Cans by mouth daily.    nystatin (MYCOSTATIN) 100,000 units/mL suspension 5 mL (500,000 Units) by Swish & Swallow route 4 times daily.    nystatin (MYCOSTATIN)  100,000 units/mL suspension Take 5 mL (500,000 Units) by mouth 4 times daily.    nystatin (MYCOSTATIN) 100000 UNIT/GM powder Apply 1 Application. topically 3 times daily. Apply to affected area    oxyCODONE (ROXICODONE) 10 MG tablet Take 1.5 tablets to 2 tablets (15mg  to 20mg ) by mouth every 4 hours as needed for pain    polyethylene glycol (MIRALAX)  17 g packet Take 1 packet (17 g) by mouth every 12 hours.    pregabalin (LYRICA) 100 MG capsule Take 1 capsule (100 mg) by mouth at bedtime.    prochlorperazine (COMPAZINE) 10 MG tablet Take 1 tablet (10 mg) by mouth every 6 hours as needed (Nausea/Vomiting).    senna (SENOKOT) 8.6 MG tablet Take 2 tablets (17.2 mg) by mouth 2 times daily.    tamsulosin (FLOMAX) 0.4 MG capsule TAKE 1 CAPSULE BY MOUTH EVERY DAY    [DISCONTINUED] tamsulosin (FLOMAX) 0.4 MG capsule TAKE 1 CAPSULE BY MOUTH EVERY DAY    tiZANidine (ZANAFLEX) 4 MG tablet Take 1 tablet (4 mg) by mouth every 8 hours as needed (muscle spasm).    traZODone (DESYREL) 50 MG tablet Take 1-2 tablets (50mg  to 100mg ) by mouth at bedtime as needed for insomnia    triamcinolone (KENALOG) 0.1 % cream Apply 1 Application. topically 2 times daily. Apply a thin layer as directed         Social History:  Social History     Socioeconomic History    Marital status: Single   Tobacco Use    Smoking status: Never     Passive exposure: Never    Smokeless tobacco: Never   Vaping Use    Vaping status: Never Used   Substance and Sexual Activity    Alcohol use: No    Drug use: Not Currently    Sexual activity: Not Currently     Partners: Female   Social Activities of Daily Living Present    Military Service No    Blood Transfusions Yes    Caffeine Concern No    Occupational Exposure No    Hobby Hazards No    Sleep Concern Yes     Comment: due to my lower back discomfort and leg discomfort    Stress Concern Yes     Comment: Health/ housing/ financial    Weight Concern No    Special Diet No    Back Care Yes     Comment: Am very careful  with any activity    Exercises Regularly Yes    Seat Belt Use Yes    Performs Self-Exams Yes    Bike Helmet Use No       Family History:  Family History   Adopted: Yes   Family history unknown: Yes     Review of Systems   Constitutional:  Positive for fatigue. Negative for chills and fever.   HENT:  Negative for ear discharge, ear pain, sinus pressure and trouble swallowing.    Eyes:  Negative for photophobia.   Respiratory:  Negative for cough and shortness of breath.    Gastrointestinal:  Negative for abdominal pain, constipation, diarrhea and nausea.   Endocrine: Negative for polydipsia, polyphagia and polyuria.   Genitourinary:  Negative for flank pain and hematuria.   Musculoskeletal:  Negative for arthralgias and back pain.        Bilateral lower extremity weakness and soreness   Skin:  Negative for rash and wound.   Neurological:  Negative for dizziness, light-headedness and headaches.   Psychiatric/Behavioral:  Negative for agitation and confusion.          Physical Exam:  BP 124/70   Pulse 58   Temp 97.5 F (36.4 C)   Resp 12   Ht 5\' 6"  (1.676 m)   Wt 74.1 kg (163 lb 5.8 oz)   SpO2 99%   BMI 26.37 kg/m  Constitutional: He is oriented to person, place, and time.     Head: Normocephalic and atraumatic.   Nose: Nose normal.   Mouth/Throat: Mucous membranes are moist. Oropharynx is clear.   Eyes: Right eye exhibits no discharge. Left eye exhibits no discharge.   Cardiovascular: Normal rate, regular rhythm, normal heart sounds and normal pulses.   Pulmonary/Chest: Effort normal and breath sounds normal. No respiratory distress. He has no wheezes.   Abdominal: Soft. Normal appearance. He exhibits no distension. There is no abdominal tenderness.   Genitourinary:    Genitourinary Comments: L nephrostomy with 30 mL clear yellow urine     Musculoskeletal:         General: Normal range of motion.      Cervical back: Normal range of motion and neck supple.      Right lower leg: No edema.      Left lower  leg: No edema.   Neurological: He is alert and oriented to person, place, and time.   Skin: Skin is warm and dry.   Psychiatric: His behavior is normal. Mood normal.         Labs and Other Data:  Lab Results   Component Value Date    NA 138 04/27/2023    K 4.2 04/27/2023    CL 100 04/27/2023    BICARB 28 04/27/2023    BUN 38 (H) 04/27/2023    CREAT 2.37 (H) 04/27/2023    GLU 153 (H) 04/27/2023    [place] 8.7 04/27/2023     Lab Results   Component Value Date    WBC 8.6 04/27/2023    HGB 8.0 (L) 04/27/2023    HCT 23.6 (L) 04/27/2023    PLT 199 04/27/2023     Lab Results   Component Value Date    WBC 8.6 04/27/2023    HGB 8.0 (L) 04/27/2023    HCT 23.6 (L) 04/27/2023    PLT 199 04/27/2023    SEG 80.3 04/27/2023    LYMPHS 8.6 04/27/2023    MONOS 10.0 04/27/2023    EOS 0.1 04/27/2023     Lab Results   Component Value Date    AST 112 (H) 04/27/2023    ALT 40 04/27/2023    ALK 78 04/27/2023    TBILI 0.86 04/27/2023    TP 5.8 (L) 04/27/2023    ALB 3.2 (L) 04/27/2023     Lab Results   Component Value Date    INR 1.3 04/27/2023    PTT 29 04/27/2023     No results found for: "ARTPH", "ARTPO2", "ARTPCO2"  No results found for: "PH", "PO2", "PCO2"  No results found for: "TSH", "FREET4", "T3"  Lab Results   Component Value Date    CPK 5,992 (HH) 04/27/2023    CKMBH 127 (H) 04/27/2023    TROPONIN 197 (HH) 04/27/2023    TROPONIN 202 (HH) 04/27/2023     Lab Results   Component Value Date    CPK 5,992 (HH) 04/27/2023    CPK 6,231 (HH) 04/27/2023    CKMBH 127 (H) 04/27/2023    CKMBH 133 (H) 04/27/2023    TROPONIN 197 (HH) 04/27/2023    TROPONIN 202 (HH) 04/27/2023     No results found for: "PHUA", "SGUA", "GLUCOSEUA", "KETONEUA", "BLOODUA", "PROTEINUA", "LEUKESTUA", "NITRITEUA", "WBCUA", "RBCUA", "EPITHCELLSUA", "CRYSTALSUA", "COMMENTSUA"    Micro:   Microbiology Results (last 30 days)       Procedure Component Value - Date/Time    Clostridioides (Clostridium) difficile Testing Algorithm Liquid Stool in  Sterile Container Stool/Feces  [[id number]] Collected: 04/20/23 1433    Lab Status: Final result Specimen: Stool/Feces Updated: 04/20/23 2319     Clostridioides (Clostridium) difficile Testing Algorithm, S Not Detected by 2 step algorithm     Comment: Initial PCR test detected toxin gene, but toxin  production not detected by EIA.      Indicates likely colonization, treatment not   usually indicated. If clinical suspicion   persists, consider ID or GI consultation. This  result cannot exclude toxin levels below   detection limits.    ----------------------------------------------  This test uses an FDA-cleared real time PCR   assay for the qualitative detection of the   Clostridioides (Clostridium) difficile toxin B  gene (tcdB), followed by an FDA-approved rapid  enzyme-linked immunoassay (ELISA) for the   qualitative detection of Clostridioides   (Clostridium) difficile toxins A and B.       Orders for repeat testing within 7 days of   this result will be rejected unless authorized  by Infectious Disease or Microbiology section   director.         Narrative:      If specimen not collected by 04/22/2023, contact physician to  reassess need for collection. If the patient has  non-diarrheal stool, the sample will not be sent and this  will be noted in the "Sticky Notes"    Urine Culture [[id number]]  (Abnormal)  (Susceptibility) Collected: 04/20/23 1423    Lab Status: Edited Result - FINAL Specimen: Urine Updated: 04/24/23 1049     Urine Culture Enterococcus faecalis  >100,000 CFU/mL  Identification performed by [employer]( [facility]-ToF). This test  was developed and its performance characteristics determined by [facility]. It has not been cleared or  approved by the U.S. Food and Drug Administration.  The FDA has  determined that such clearance or approval is not necessary.        Pseudomonas putida  >100,000 CFU/mL  Identification performed by [employer]( [facility]di-ToF). This test  was developed and its  performance characteristics determined by [facility]. It has not been cleared or  approved by the U.S. Food and Drug Administration.  The FDA has  determined that such clearance or approval is not necessary.  ----------------------------------------------  CARBAPENEM RESISTANT ORGANISM  This result has been reported to the [facility] and   Epidemiology Unit as required by CCR Title 17,  Section 2500.      Susceptibility        Enterococcus faecalis      MIC      Ampicillin Susceptible  [1]       Daptomycin Susceptible  [2]       Levofloxacin Susceptible  [3]       Linezolid Susceptible  [2]       Nitrofurantoin Susceptible  [2]       Penicillin G Susceptible  [1]       Vancomycin Susceptible  [2]                    [1]  MODERATE risk of C. diff infection     [2]  Low risk of C. diff infection     [3]  HIGH risk of C. diff infection                Susceptibility        Pseudomonas putida      MIC  Amikacin Resistant  [1]       Aztreonam Resistant  [1]       Cefepime Intermediate  [2]       Ceftazidime Resistant  [2]       Ciprofloxacin Susceptible  [2]       Gentamicin Intermediate  [1]       Levofloxacin Susceptible  [2]       Meropenem Susceptible  [2]       Piperacillin/Tazobactam Susceptible  [3]       Tetracycline Susceptible  [1]       Tobramycin Susceptible  [1]                    [1]  Low risk of C. diff infection     [2]  HIGH risk of C. diff infection     [3]  MODERATE risk of C. diff infection                              Imaging:   No results found.      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 Roseanne Reno), CKD, BPH s/p TURP, gout and insomnia who was referred to ED from infusion center for hypotension ( BP 45/21), found to have AKI and rhabdomyolysis .     # AKI on  CKD  # Solitary L kidney ( s/p nephrectomy as a child for hydronephrosis)  # S/p L PCN 04/2021  Pt endorsing poor PO intake for weeks. During recent OSH hospitalization, L nephrostomy exchange on the left side to 8Fr tube on 04/21/2023. Pt reports that L PCN has been draining normally.   Cr 2.37 ( bl 0.99-1.2)  - f/u with US kidney   - s/p NS 2L bolus, continue MIVF NS 100 cc/hr for 16 hours   - continue to trend Cr  - strict I&O   - avoid nephrotoxic medcaions     # Elevated CPK c/f rhabdomyolysis  CPK 6231-->5992--> 5616  - repeat CPK in AM     # Hypotension  # C/f urinary infection   Pt referred to ED from infusion center due to hypotension with BP 45/21, now s/p NS 2L bolus with improvement of BP to 116/67  Pt afebrile, VSS, BP improved after fluids, no hypoxia  No leukocytosis  UA with 250LE, 21-50 WBC and rare bacteria. Recent urine culture from 5/02 with Enterococcus and Pseudomonas putida   - will start levofloxacin ( 5/9 -  )  - f/u with blood cultures     # Elevated troponin  Troponin 202-->197-->181  - cardiology consulted by ED   - no ischemic work up indicated at this time   - f/u with echocardiogram   - cardiac monitoring     # Metastatic urothelial carcinoma ( osseous mets, s/p L PCN 04/2022, currently on Enfortumab vedotin + pembrolizumab, followed by Dr. Roseanne Reno)  - notify oncologist of admission in AM     # Cancer related pain   - continue with PO oxycodone 5 mg for mod/ 10 mg for severe q4h PRN   - hold home tizanidine 4 mg q8h PRN d/t hypotension  - hold home lyrica due to rhabdomyolysis     # Chronic anemia  Hgb 8.0( bl hgb 8.2-11.9)  -  continue to monitor hgb     # BPH s/p TURP  - hold home tamsulosin 0.4 mg     # Gout  - hold home allopurinol d/t AKI    # Insomnia   - continue home trazodone 100 mg nightly    # Level of care  - med/surg tele    # FEN  - diet mechanical soft     # PPX  - subcutaneous heparin    # Code  - full code           [place] Charon, PA    04/27/23, 2:29 PM    Days to  Complete Medical Plan of Care Requiring Ongoing Hospitalization: 3 days out --->Care Needs: Clinical Improvement/Symptom Control: Hypotension/AKI/ Rhabdo                 (Discharge Planning and EDD Info)    .

## 2023-04-27 NOTE — ED Floor Report (Signed)
ED to IP Handoff    Report created by Carlean Jews, RN at 3:18 PM 04/27/2023.     HANDOFF REPORT UPDATE/CHANGES (changes in patient status/care/events prior to transfer)  By who:  Time:   Additional information:                                                                                                                                                     Stephen [name] is a 72 year old male.    Brief Summary of ED Visit (to include focused assessment and neuro status):    Per ER MD Campbell's notes,   "       Chief Complaint   Patient presents with    Hypotension       Hx of kidney [place]. BIBA from [facility] center, pt became unresponsive prior to infusion tx, initial BP 45/21, IVF bolus administered at clinic. 99/55 rpt BP upon medical arrival. Pt denies cp, sob.          HPI: Stephen [name] is a 72 year old male who has a past medical history of Chronic back pain, Congenital hydronephrosis, Gout, Headache, Hematuria, HTN (hypertension) (12/10/2021), Kidney disease, Kidney stones, Major depressive disorder, single episode, Polyarthropathy or polyarthritis of multiple sites, Retinal detachment, and Urethral stricture.  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  He has a left nephrostomy tube and has not noticed a change in output.  He has not had a cough chest pain or shortness of breath he denies headache.  Patient states he has not been able to walk the last few days he has significant bilateral lower extremity edema he has not had recent transfusions.  I did review his epic records he had a TEE done January of 2023 which was normal I also noted  patient had a history of stercoral colitis earlier this month patient states he has been having normal bowel movements."    RN shift assessment exceptions to WDL: +generalized weakness, BLE swelling and cramping    Any significant events and interventions with responses:  Pls see MAR    Radiologic studies not completed: US kidney  (None unless otherwise noted)    Chief Complaint   Patient presents with    Hypotension     Hx of kidney [place]. BIBA from [facility] center, pt became unresponsive prior to infusion tx, initial BP 45/21, IVF bolus administered at clinic. 99/55 rpt BP upon medical  arrival. Pt denies cp, sob.        Admitted for: rhabdomyolysis    Code Status:  Please refer to In-pt admitting doctors orders     Level of Care: Tele     Sepsis Section:    Is patient septic? no If yes, complete below:    BC x 2 drawn? no  If No explain:  n/a    Repeat lactate needed? no  If Yes, when is it due?      Have TWO Blood Pressures (with MAPs) been documented AFTER crystalloids completed?  no     All initial antibiotics given?  no  If No, explain:  n/a    Amount of IV fluids received 2000 mL  _________________________________________________________________    Is patient on Heparin? no If yes, complete below:     Time Heparin bolus was given:     Additional drips patient is on:     Cardiac rhythm: SB    Oxygen Delivery: None    Past Medical History:   Diagnosis Date    Chronic back pain     Congenital hydronephrosis     Gout     Headache     Hematuria     HTN (hypertension) 12/10/2021    Kidney disease     Kidney stones     Major depressive disorder, single episode     Polyarthropathy or polyarthritis of multiple sites     Retinal detachment     Urethral stricture        Past Surgical History:   Procedure Laterality Date    CT INSERTION OF SUPRAPUBIC CATH  09/25/2015    NEPHRECTOMY Right 1955    APPENDECTOMY      COLONOSCOPY      CYSTOSCOPY      CYSTOSCOPY W/ LASER LITHOTRIPSY      OTHER SURGICAL HISTORY      Interstim  01/29/2011    SPINE SURGERY  09/21    Lumbar-sacral fusion    TRANSURETHRAL RESECTION OF PROSTATE         Allergies: Sulfa drugs    ED Fall Risk: (!) Yes    Skin issues:  no    >> If yes, note areas of skin breakdown. See appropriate photos.      Ambulatory:  no    Sitter needed: no    CSSRS Suicide Risk Level Screening in Triage: Minimal Risk    Does this patient have a history of aggressive behavior and/or is this patient a green banner patient? no    Isolation Required: no     >> If yes , what type of isolation:     Is patient in custody?  no    Is patient in restraints? no    Swallow Screen:      Swallow Screen Result:      Was the patient made NPO & a Speech Language Pathology Consult Ordered? no    Vitals:    04/27/23 1357 04/27/23 1412 04/27/23 1427 04/27/23 1457   BP: 119/73 124/70 134/68 116/67   Pulse: 54 58 53 (!) 48   Resp: 19 12 13 12    Temp:       SpO2: 100% 99% 100% 100%   Weight:       Height:           Tubes Collected: (S) Blue, Green PST, Lavender, Yellow SST, Green PST on Ice (04/27/23 1052 : Brahim Dolman, Kelli Churn, RN)  Initial Lactate (time acquired) : (S) 1048 (  04/27/23 1052 : SilverioKelli Churn, RN)  Repeat Lactate (time acquired): (S) 1308 (04/27/23 1052 : SilverioKelli Churn, RN)    Lab Results   Component Value Date    WBC 8.6 04/27/2023    RBC 2.76 (L) 04/27/2023    HGB 8.0 (L) 04/27/2023    HCT 23.6 (L) 04/27/2023    MCV 85.5 04/27/2023    MCHC 33.9 04/27/2023    RDW 17.7 (H) 04/27/2023    PLT 199 04/27/2023    MPV 9.9 04/27/2023       Lab Results   Component Value Date    NA 138 04/27/2023    K 4.2 04/27/2023    CL 100 04/27/2023    BICARB 28 04/27/2023    BUN 38 (H) 04/27/2023    CREAT 2.37 (H) 04/27/2023    GLU 153 (H) 04/27/2023    [place] 8.7 04/27/2023       Lab Results   Component Value Date    LACTATE 1.2 04/27/2023       Lab Results   Component Value Date    CPK 5,992 (HH) 04/27/2023    CPK 6,231 (HH) 04/27/2023    CKMBH 122 (H) 04/27/2023    CKMBH 127 (H) 04/27/2023     CKMBH 133 (H) 04/27/2023    TROPONIN 181 (HH) 04/27/2023    TROPONIN 197 (HH) 04/27/2023    TROPONIN 202 (HH) 04/27/2023       No results found for: "PH", "PCO2", "O2CONTENT", "IVHC3", "IVBE", "O2SAT", "UNPH", "UNPCO2", "ARTPH", "ARTPCO2", "ARTO2CNT", "IAHC3", "IABE", "ARTO2SAT", "UNAPH", "UNAPCO2"    No results found for this visit on 04/27/23.      Patient Lines/Drains/Airways Status       Active PICC Line / CVC Line / PIV Line / Drain / Airway / Intraosseous Line / Epidural Line / ART Line / Line Type / Wound / Pressure Ulcer Injury       Name Placement date Placement time Site Days    Port A Cath Single Lumen  - 02/13/23 Right Internal jugular 02/13/23  0908  Internal jugular  73    Peripheral IV - 22 G Right Antecubital --  --  Antecubital  --    Nephrostomy -  Left #1 11/01/22  1333  Left  177    Nephrostomy -  Left 03/21/23  1610  Left  36    Stent -  Ureteral (left) 12/24/21  1102  Ureteral (left)  489                        ED Handoff Report is ready for review.  Admitting RN may reach Emergency Department RN, Kelli Churn Tonjua Rossetti, RN, at [phone] with any questions.

## 2023-04-27 NOTE — ED Notes (Signed)
Patient transported to ECHO at this time. Pt on cardiac monitoring, RN present in ECHO.

## 2023-04-28 ENCOUNTER — Encounter: Payer: Self-pay | Admitting: *Deleted

## 2023-04-28 DIAGNOSIS — Z006 Encounter for examination for normal comparison and control in clinical research program: Secondary | ICD-10-CM

## 2023-04-28 DIAGNOSIS — R4182 Altered mental status, unspecified: Secondary | ICD-10-CM | POA: Diagnosis present

## 2023-04-28 DIAGNOSIS — N179 Acute kidney failure, unspecified: Secondary | ICD-10-CM | POA: Diagnosis present

## 2023-04-28 DIAGNOSIS — I959 Hypotension, unspecified: Secondary | ICD-10-CM | POA: Diagnosis present

## 2023-04-28 DIAGNOSIS — R339 Retention of urine, unspecified: Secondary | ICD-10-CM

## 2023-04-28 DIAGNOSIS — G4700 Insomnia, unspecified: Secondary | ICD-10-CM

## 2023-04-28 DIAGNOSIS — Z905 Acquired absence of kidney: Secondary | ICD-10-CM

## 2023-04-28 DIAGNOSIS — D62 Acute posthemorrhagic anemia: Secondary | ICD-10-CM

## 2023-04-28 DIAGNOSIS — Z936 Other artificial openings of urinary tract status: Secondary | ICD-10-CM

## 2023-04-28 DIAGNOSIS — C679 Malignant neoplasm of bladder, unspecified: Secondary | ICD-10-CM

## 2023-04-28 DIAGNOSIS — N189 Chronic kidney disease, unspecified: Secondary | ICD-10-CM

## 2023-04-28 DIAGNOSIS — M109 Gout, unspecified: Secondary | ICD-10-CM

## 2023-04-28 LAB — CBC WITH DIFF, BLOOD
ANC-Automated: 5.7 10*3/uL (ref 1.6–7.0)
Abs Basophils: 0 10*3/uL (ref <0.2)
Abs Eosinophils: 0.1 10*3/uL (ref 0.0–0.5)
Abs Lymphs: 1.4 10*3/uL (ref 0.8–3.1)
Abs Monos: 0.7 10*3/uL (ref 0.2–0.8)
Basophils: 0.4 %
Eosinophils: 1.3 %
Hct: 24 % — ABNORMAL LOW (ref 40.0–50.0)
Hgb: 8 gm/dL — ABNORMAL LOW (ref 13.7–17.5)
Imm Gran %: 0.4 % (ref <1)
Lymphocytes: 18 %
MCH: 28.6 pg (ref 26.0–32.0)
MCHC: 33.3 g/dL (ref 32.0–36.0)
MCV: 85.7 um3 (ref 79.0–95.0)
MPV: 10 fL (ref 9.4–12.4)
Monocytes: 8.4 %
Plt Count: 210 10*3/uL (ref 140–370)
RBC: 2.8 10*6/uL — ABNORMAL LOW (ref 4.60–6.10)
RDW: 17.8 % — ABNORMAL HIGH (ref 12.0–14.0)
Segs: 71.5 %
WBC: 7.9 10*3/uL (ref 4.0–10.0)

## 2023-04-28 LAB — COMPREHENSIVE METABOLIC PANEL, BLOOD
ALT (SGPT): 39 U/L (ref 0–41)
AST (SGOT): 88 U/L — ABNORMAL HIGH (ref 0–40)
Albumin: 2.7 g/dL — ABNORMAL LOW (ref 3.5–5.2)
Alkaline Phos: 71 U/L (ref 40–129)
Anion Gap: 7 mmol/L (ref 7–15)
BUN: 27 mg/dL — ABNORMAL HIGH (ref 8–23)
Bicarbonate: 27 mmol/L (ref 22–29)
Bilirubin, Tot: 0.71 mg/dL (ref <1.2)
Calcium: 8.5 mg/dL (ref 8.5–10.6)
Chloride: 102 mmol/L (ref 98–107)
Creatinine: 1.5 mg/dL — ABNORMAL HIGH (ref 0.67–1.17)
Glucose: 116 mg/dL — ABNORMAL HIGH (ref 70–99)
Potassium: 4.2 mmol/L (ref 3.5–5.1)
Sodium: 136 mmol/L (ref 136–145)
Total Protein: 5.2 g/dL — ABNORMAL LOW (ref 6.0–8.0)
eGFR Based on CKD-EPI 2021 Equation: 50 mL/min/{1.73_m2}

## 2023-04-28 LAB — CPK-CREATINE PHOSPHOKINASE, BLOOD: CPK: 2891 U/L — ABNORMAL HIGH (ref 0–175)

## 2023-04-28 LAB — CORTISOL, BLOOD: Cortisol: 21.7 ug/dL

## 2023-04-28 MED ORDER — POLYETHYLENE GLYCOL 3350 OR PACK
17.0000 g | PACK | Freq: Two times a day (BID) | ORAL | Status: DC | PRN
Start: 2023-04-28 — End: 2023-04-29

## 2023-04-28 MED ORDER — SODIUM CHLORIDE 0.9 % IV SOLN
INTRAVENOUS | Status: AC
Start: 2023-04-28 — End: 2023-04-29

## 2023-04-28 MED ORDER — SENNA 8.6 MG OR TABS
2.0000 | ORAL_TABLET | Freq: Two times a day (BID) | ORAL | Status: DC | PRN
Start: 2023-04-28 — End: 2023-04-29

## 2023-04-28 MED ORDER — OXYCODONE HCL 5 MG OR TABS
5.0000 mg | ORAL_TABLET | ORAL | Status: DC | PRN
Start: 2023-04-28 — End: 2023-04-29
  Administered 2023-04-28: 5 mg via ORAL
  Filled 2023-04-28: qty 1

## 2023-04-28 MED ORDER — HYDROXYZINE HCL 25 MG OR TABS
25.0000 mg | ORAL_TABLET | Freq: Once | ORAL | Status: AC
Start: 2023-04-28 — End: 2023-04-28
  Administered 2023-04-28: 25 mg via ORAL
  Filled 2023-04-28 (×2): qty 1

## 2023-04-28 MED ORDER — TRAZODONE HCL 50 MG OR TABS
50.0000 mg | ORAL_TABLET | Freq: Every evening | ORAL | Status: DC | PRN
Start: 2023-04-28 — End: 2023-04-29
  Administered 2023-04-28: 50 mg via ORAL
  Filled 2023-04-28: qty 1

## 2023-04-28 NOTE — Research (Signed)
Message left for Mr Nevins about V1p research. Encouraged him to call if interested.

## 2023-04-28 NOTE — Interdisciplinary (Signed)
Physical Therapy Evaluation    Admitting Physician:  Simeon Craft, MD  Admission Date 04/27/2023    Inpatient Diagnosis:     IP Start of Service   Start of Care: 04/28/23  Onset Date: 04/27/23  Reason for referral: Activity tolerance limitation;Decline in functional ability/mobility;Range of motion/strength limitations    Preferred Language:English         Past Medical History:   Diagnosis Date    Chronic back pain     Congenital hydronephrosis     Gout     Headache     Hematuria     HTN (hypertension) 12/10/2021    Kidney disease     Kidney stones     Major depressive disorder, single episode     Polyarthropathy or polyarthritis of multiple sites     Retinal detachment     Urethral stricture       Past Surgical History:   Procedure Laterality Date    CT INSERTION OF SUPRAPUBIC CATH  09/25/2015    NEPHRECTOMY Right 1955    APPENDECTOMY      COLONOSCOPY      CYSTOSCOPY      CYSTOSCOPY W/ LASER LITHOTRIPSY      OTHER SURGICAL HISTORY      Interstim 01/29/2011    SPINE SURGERY  09/21    Lumbar-sacral fusion    TRANSURETHRAL RESECTION OF PROSTATE          PT Acute       Row Name 04/28/23 1100          Type of Visit    Type of Physical Therapy note Physical Therapy Evaluation       Row Name 04/28/23 1100          Treatment Precautions/Restrictions    Precautions/Restrictions Fall     Fall Socks/charm       Row Name 04/28/23 1100          Medical History    History of presenting condition Reco Iseminger is a 72 year old male with a history of solitary L kidney ( s/p nephrectomy as a child for hydronephrosis), metastatic urothelial carcinoma ( osseous mets, 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  Pt is found to have AKI and rhabdomyolysis .     Fall history Falls reported in the last 6 months       Row Name 04/28/23 1100           Functional History    Prior Level of Function Minimal deficits     Equipment required for mobility in the home Walker     Other Functional History Information Pt has been ambulating around home with walker indepdendently.       Row Name 04/28/23 1100          Social History    Living Situation Lives with parent/family     Home Environment House     Home accessibility  Performs activities of daily living (ADL's) on one level     Other Social History Information Pt lives with sister who is abale to provide 24/7 care.       Row Name 04/28/23 1100          Subjective    Subjective Information Pt reports he is feeling pain in his back and R  knee. Pt denies any numbness or tingling. Pt interested in walking and reports he feels better with his pain once walking. Pt notes his R side feels weaker than L.     Patient status Patient agreeable to treatment;Nursing in agreement for treatment       Row Name 04/28/23 1100          Pain Assessment    Pain Asssessment Tool Numeric Pain Rating Scale       Row Name 04/28/23 1100          Numeric Pain Rating Scale    Pain Intensity - rating at present 7     Pain Intensity- rating after treatment 4     Location R knee and back       Row Name 04/28/23 1100          Objective    Overall Cognitive Status Intact - no cognitive limitations or impairments noted     Other  Cognitive Status Information --     Communication No communication limitations or impairments noted. Current status of hearing, speech and vision allow functional communication.     Coordination/Motor control No limitations or impairments noted. Movement patterns are fluid and coordinated throughout     Balance Balance limitations present     Static Sitting Balance Normal - able to maintain steady balance without handhold support     Dynamic Sitting Balance Good - accepts moderate challenge, able to maintain balance while picking object off floor     Static Standing Balance Good - able to maintain balance without  handhold support, limited postural sway     Dynamic Standing Balance Fair - accepts minimal challenge, able to maintain balance while turning head/trunk     Other Balance Information Pt able to stand statically with BUE on 4WW.     Extremity Assessment Range of motion, strength,  muscle tone and/or sensation limitations present     LLE findings hip flex, knee ext, ankle DF 3/5     RLE findings hip flex, knee ext, ankle DF 3+/5  not consistent with subjective     Other  Extremity Assessment  Information Light touch intact for BLE.     Functional Mobility Functional mobility deficits present     Bed Mobility Minimum assistance (25% assistance)     Bed Mobility Comments Pt transfered from supine to EOB with min A.     Transfers to/from Stand Minimum assistance (25% assistance);Supervised     Transfer Comments Pt required SBA w FWW for STS and stand to chair. Pt had poor eccentric control when sitting in chair.     Gait Supervised  W/ FWW     Gait Comments Pt had decreased stride stance, cadence, and hip/knee flexion d/t pain in R knee and BLE weakness.     Device used for ambulation/mobility Other (comments)  Four wheel walker     Ambulation Distance ~250 ft     Other Objective Findings Pt presented supine in bed, BP 109/66. Pt at EOB, BP 107/63. Pt STS, BP 117/59. Pt post gait ambulation in sitting, BP 126/61. Pt denies dizziness throughout session. Pt returned in chair with needs med. Nursing staff noified pt at chair and his BP.                          Eval cont.       Row Name 04/28/23 [address]  AM-PAC: Basic Mobility    Assistance Needed to Turn from Back to Side While in a Flat Bed Without Using Bedrails 3 - A little (supervised/min assist)     Difficulty with Supine to Sit Transfer 3 - A little (supervised/min assist)     How Much Help Needed to Move to/from Bed to Chair 3 - A little (supervised/min assist)     Difficulty with Sit to Stand Transfer from Chair with Arms 3 - A little (supervised/min  assist)     How Much Help Needed to Walk in Room 3 - A little (supervised/min assist)     How Much Help Needed to Climb 3-5 Steps with a Rail 2 - A lot (mod/max assist)     AMPAC Total Score 17     Assessment: AM-PAC Basic Mobility Impairment Rating Score 13-18 - 40-59% impaired       Row Name 04/28/23 1100          Patient/Family Education    Learner(s) Patient     Learner response to rehab patient education interventions Verbalizes understanding     Patient/family training comments role of PT, PT POC, gait training       Row Name 04/28/23 1100          Assessment    Assessment Pt is a 72 year old male presenting with hypotension , found to have AKI and rhabdomyolysis. Pt is limited by BLE weakness, balance impairments, and decline in activity tolerance, impacting his functional mobility and ADLs. Skilled PT is reccomended to improve impairments above.    POST ACUTE REHABILITATION DISCHARGE RECOMMENDATIONS:    Recommended Post-Acute Therapy Follow up:   Outpatient    Patient appropriate for discharge to prior living situation    Deficits :   Bed mobility: assist level: Minimum (25% or less assistance)  Transfers: assist level: Minimum (25% or less assistance)  Gait: assist level: Supervision      Equipment recommendations:   None         Rehab Potential Good       Row Name 04/28/23 1100          Patient stated Goal    Patient stated goal Walk around more       Row Name 04/28/23 1100          Goal 1 (Short Term)    Impairment Functional mobility limitation     Custom goal Pt will be able to mobilize in bed and transfer with supervision and LRAD.     Number of visits 6     Goal Status New       Row Name 04/28/23 1100          Goal 2 (Short Term)    Impairment Balance impairment     Balance To improve postural control and safety in standing, patient able to maintain static standing balance without handhold support and limited postural sway     Number of visits 6     Goal Status New       Row Name 04/28/23 1100           Goal 3 (Short Term)    Impairment Gait impairment     Custom goal Pt wil be able to ambulate ~351ft+ with LRAD and supervision.     Number of visits 6     Goal Status New       Row Name 04/28/23 1100          Planned Therapy Interventions  and Rationale    Gait Training to normalize gait pattern and improve safety while ambulating with assistive device     Neuromuscular Re-Education to improve kinesthetic awareness and postural control     Therapeutic Activities to improve functional mobility and ability to navigate in the home and/or community;to improve transfers between surfaces     Theraputic Exercise to improve activity tolerance to allow greater independence with functional mobility skills;to increase strength to allow greater independence with functional mobility skills       Row Name 04/28/23 1100          Treatment Plan Disussion    Treatment Plan Discussion and Agreement Patient/family/caregiver stated understanding and agreement with the therapy plan       Row Name 04/28/23 1100          Treatment Plan    Continue therapy to address Activity tolerance limitation;Decline in functional ability/mobility;Range of Motion/Strength limitations     Frequency of treatment 3 times per week     Duration of treatment (number of visits) While patient is hospitalized and in need of skilled therapy services     Status of treatment Patient evaluated and will benefit from ongoing skilled therapy       Row Name 04/28/23 1100          Patient Safety Considerations     Patient safety considerations Patient left sitting at end of treatment;Call light left in reach and fall precautions in place;Nursing notified of safety considerations at end of treatment     Patient assistive device requirements for safe ambulation Lupita Shutter Name 04/28/23 1100          Therapy Plan Communication    Therapy Plan Communication Discussed therapy plan with Nursing and/or Physician       Row Name 04/28/23 1100          Physical Therapy  Patient Discharge Instructions    Your Physical Therapist suggests the following Continue to use your assistive device as instructed when walking to protect your injured extremity until directed by your Physician that you can progress with your activity       Row Name 04/28/23 1100          Type of Eval    Low Complexity [phone]) Completed       Row Name 04/28/23 1100          Therapeutic Procedures    Gait Training (56213) Patient education;Gait pattern analysis and treatment of deviations        Total TIMED Treatment (min)  10     Therapeutic Activities (97530)  Assistance/facilitation of bed mobility;Patient education;Transfer training with weight shift and direction change        Total TIMED Treatment (min)  15       Row Name 04/28/23 1100          Treatment Time     Total TIMED Treatment  (min) --  25     Total Treatment Time (min) 40     Treatment start time 0925                        The physical therapist of record is endorsed by evaluating physical therapist.

## 2023-04-28 NOTE — Interdisciplinary (Signed)
04/28/23 1151   Initial Assessment   CM Initial Assessment * Completed   Readmission Risk Assessment   Readmission Within 30 Days of Discharge * No   Do You Have Transportation Issues/Concerns That Make It Difficult To Get To Your Appointments?  No   Recent Hospitalizations (Within Last 6 Months) * Yes   High Risk For Readmission * Yes   High Risk Indicators Anticipated long term health care needs (e.g. new diabetic, CHF, Stroke, MI, etc.)   Action Taken To Prevent Readmission After Discharge * Social work evaluation and recommendations   Recommendations to the Physician* Case management consult;Social worker evaluation   Patient Information   Where was the patient admitted from? * Home   Address on Facesheet correct?* Yes   Patient contact phone number on Facesheet correct? Yes   PCP listed on Facesheet correct? Yes   Prior to Level of Function * Use Assisted Devices   Assistive Device * Rollator/4 Wheel [place];Walker;Cane   Home Care Services * Yes   Type of Home Care Services on Admission Home PT   Home Service Name,  Address and Phone Number on Admission [facility]   Additional Services on Admission Not Applicable   Available Assistance/Support System  and Prior Community Resources Family member(s)   Primary Caretaker(s) * Self;Family   Primary Family/Caregiver Contact Name, Number and Relationship * Colman Cater, sister, [phone]   Permission to Contact * Yes   Social Worker Consult   Do you need to see a Child psychotherapist? * No   Income Information   Income Source Chief Technology Officer History Not Applicable   Veterans Affiliation No   Do you have difficulty affording your medications No   Discharge Planning   Living Arrangements on Admission* Family Member   Type of Residence * One Story Home   Stairs/Steps to home  Yes   Number of Stairs/Steps 1   Patient's Discharge Goal(s) Home;Home Health   Anticipated Discharge Disposition/Needs Home;Home with Family;Home  PT/OT   Anticipated DischargeTransportation *  Other (Comment)   Anticipated Discharge Transportation Details pt stated that he may need transport but he does have [employer] transport benefits.   Barriers to Discharge * None   Patient Engaged in Discharge Planning * Yes   PATIENT CHOICE: Patient/Family/Legal/Surrogate Decision Maker Has Been Given a List Options And Choice In The Selection of Post-Acute Care Providers Yes   Patient's Top 3 Discharge Facility Choices  [facility]   CM discussed the following with pt, and/or family, and/or DPOA [place] has agreements with select post-acute care providers in the collaborative care network;If patient has chosen [employer] or [facility] for post-acute care, they were informed that we partner with, and have a financial interest in these organizations   Family/Caregiver's Assessed for * Readiness, willingness, and ability to provide or support self-management activities;Readiness to provide care to the patient   Respite Care * Not Applicable   Patient/Family/Other Are In Agreement With Discharge Plan * To be determined   Public Health Clearance Needed * No     Medical Necessity: rhabdomyolysis    LOS at time of Initial Assessment: 1 Day 1 Hour  Pt admitted on 04/27/2023 10:46 AM    LACE+ Score: 69    Address verified as discharge address: verified with pt  [address] [place] [address] - [phone] (home)    PCP verified:  Renelda Loma  [address] /  [place] [address]  telephone [phone]  fax [phone]    Pharmacy:  [facility] [phone] - [place], [place] - [address]    PLOF:  ind with FWW and cane    Hx of SNF placement:  none    Hx of Home health services:  [facility]    - Use previous home health agency upon discharge (yes/no): yes    DME (includes ALL home equipment): rolator, FWW, cane    Hx of HD/PD: n/a   Maintenance Dialysis History    Patient has no recorded history of maintenance  dialysis.     Clinic:  Address:   Phone:  Chair schedule:  Transport:  Verified by:     DISCHARGE PLANNING    Support system:  CM met with pt at bedside.  Pt stated that he lives in a [facility] with one step to enter with his sister and niece.      Anticipated DC disposition (home, SNF, etc) :  home with family     Anticipated DC needs (HH, DME, none, etc):  pending MD recs.  Started referral to resume [facility] PT/OT with [facility] per pt request.      Anticipated barriers to discharge:   none    Transportation: insurance benefits.            Expected discharge date:  pending clinical progression        Jerre Simon, RN

## 2023-04-28 NOTE — Discharge Summary (Signed)
Date of Admission:  04/27/2023  Date of Discharge:  04/29/23    Patient Name:  Stephen [name]    Principal Diagnosis (required):  Rhabdomyolysis    Hospital Problem List (required):  Active Hospital Problems    Diagnosis    *Rhabdomyolysis [M62.82]    Acute kidney injury superimposed on chronic kidney disease (CMS-HCC) [N17.9, N18.9]    Hypotension [I95.9]    Altered mental status [R41.82]      Resolved Hospital Problems   No resolved problems to display.       Additional Hospital Diagnoses ("rule out" or "suspected" diagnoses, etc.):                  Principal Procedure During This Hospitalization (required):  Ultrasound of kidneys    Other Procedures Performed During This Hospitalization (required):    US Kidney Complete    Result Date: 04/28/2023  IMPRESSION: Moderate left hydronephrosis with percutaneous nephrostomy tube. This is unchanged compared to 04/20/2023 CT abdomen and pelvis and 10/22/2022 MRI urogram given differences in technique.       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 Patient denise any chest pain or shortness of breath. We are consulted for elevated troponins. Appears euvolemic on exam with mildl LE edema. Labs notable for AKI and elevated troponins, but dramatically lower than elevated CPK. ECG notable for sinus bradycardia without any significant ischemic changes. Echocardiogram with preserved biventricular function and no significant WMA. Overall, clinical presentation more consistent with rhabdomyolysis. Less likely to be related to ACS, myocarditis, or heart failure. Agree with admission to medicine and work-up for acute rhabdomyolysis and polymyositis with immune checkpoint therapy. Rest of plan per primary note.     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 Roseanne Reno), CKD, BPH s/p TURP, gout and insomnia who was referred to ED from infusion center for hypotension ( BP 45/21), found to have AKI and rhabdomyolysis     Hospital Course by Problem (required):  # Hypotension  # C/f urinary infection   Pt referred to ED from infusion center due to hypotension with BP 45/21. Improved with fluids and initiation of antibiotics and has maintained MAP > 65 without hypoperfusion symptoms since admission.  Seen by Cardiology this admission given elevated troponin with ECHO 04/27/2023 reviewed who were not concerned for ACS. ECHO without evidence of tamponade, wall motion abnormalities, or valvular defects to explain the hypotensive episode. Pt is not tachycardiac and on room air, so PE suspicion is also low. Suspect hypotensive episode is combination of medication and possible UTI.   Recommended stopping Flomax given he does not urinate via his urethra anymore and has good urine output through his nephrostomy tube. Also discussed stopping Trazodone, however he was agreeable to decreasing dose to 50mg  from 100mg . Advised to stop Zanaflex as well for now.    # Possible urinary tract infection: with persistent abnormal urinalysis (although collected from nephrostomy)  - will opt to treat due to presenting hypotension and immunocompromised state.   - continue levofloxacin 750mg  daily for total 5 day course. End 5/13. (based on prior susceptibilities) QTC 409     #  AKI on CKD  # Solitary L kidney ( s/p nephrectomy as a child for hydronephrosis)  # S/p L PCN 5/202  - suspect pre-renal in setting of hypotension. Improved with IVF with good urine output.   - pt has persistent left hydronephrosis, reviewed by urology, and no intervention needed as left PCN tube is draining  well.   - Cr near baseline on discharge.      # Elevated CPK c/f rhabdomyolysis   - improved with IVF.   - pt denies any trauma, but does report being more bedbound the past few days due to right medial thigh pain that has now improved.   history not consistent with myositis given abrupt onset and now improvement. May be pain mediated, as he reports pain in medial thigh ligament. No physical skin exam findings on hand and face concerning for polymyositis, dermatomyositis. No evidence of mechanic hands. Had good LE and UE strength on my exam without fatigability. Seen by PT who recommend outpatient PT. Will place referral and also renew home PT.     # Acute encephalopathy: toxic metabolic. Resolved.        # Metastatic urothelial carcinoma ( osseous mets, s/p L PCN 04/2022, currently on Enfortumab vedotin + pembrolizumab, followed by Dr. Roseanne Reno)  - message sent to oncologist to inform of admission      # Cancer related pain   - continue home oxycodone.   - hold home tizanidine 4 mg q8h PRN d/t hypotension  - Continue lyrica      # BPH s/p TURP   - hold home tamsulosin 0.4 mg given hypotension, and pt does not urinate through urethra anymore. Advised to follow up with urology to discuss further.      # Gout   - cont home allopurinol d/t AKI     # Insomnia   - home trazodone decrease to 50mg  nightly     Tests Outstanding at Discharge Requiring Follow Up:  There are no tests or other items that require follow up after hospital discharge.      Discharge Condition (required):  Stable.    Key Physical Exam Findings at Discharge:  Mental Status Exam: Patient is alert and oriented to person, place, time, and situation.  Cardiac: RRR. No murmurs     Abdomen: Abdomen soft, non-tender. Left nephrostomy tube with clear urine.   Extremities: 4/5 strength in b/l LE.       Discharge Diet:  Regular.    Discharge Disposition:  Home.        Discharge Code Status:  Full code / full care  This code status is not changed from the time of  admission.    Discharge Medications:     What To Do With Your Medications        START taking these medications        Add'l Info   levoFLOXacin 750 MG tablet  Commonly known as: LEVAQUIN  Take 1 tablet (750 mg) by mouth daily for 3 days.   Quantity: 3 tablet  Refills: 0            CHANGE how you take these medications        Add'l Info   traZODone 50 MG tablet  Commonly known as: DESYREL  Take 1 tablets (50mg ) by mouth at bedtime as needed for insomnia   Quantity: 60 tablet  Refills: 0  What changed: additional instructions            CONTINUE taking  these medications        Add'l Info   acetaminophen 500 MG tablet  Commonly known as: TYLENOL  Take 2 tablets (1,000 mg) by mouth every 8 hours as needed for Mild Pain (Pain Score 1-3) or Moderate Pain (Pain Score 4-6).   Quantity: 40 tablet  Refills: 0     Alaway 0.035 % (0.025 % base) ophthalmic solution  Place 1 drop into both eyes 2 times daily.  Generic drug: ketotifen fumarate   Refills: 0     allopurinol 100 MG tablet  Commonly known as: ZYLOPRIM  Take 1 tablet (100 mg) by mouth daily.   Quantity: 30 tablet  Refills: 0     diclofenac 1 % gel  Commonly known as: VOLTAREN  Apply 2 g topically every 6 hours as needed (Right knee pain).   Quantity: 1 each  Refills: 0     DULoxetine 20 MG CR capsule  Commonly known as: CYMBALTA  Take 1 capsule (20 mg) by mouth daily.   Quantity: 90 capsule  Refills: 3     lidocaine 4 % patch  Commonly known as: ASPERCREME  Apply 1 patch topically every 24 hours. Leave patch on for 12 hours, then remove for 12 hours.   Quantity: 30 patch  Refills: 0     multivitamin with minerals Tabs tablet  Take 1 tablet by mouth daily.   Quantity: 30 tablet  Refills: 1     naloxone 4 mg/0.1 mL nasal spray  Commonly known as: NARCAN  For suspected opioid overdose, call 911! Then spray once in one nostril. Repeat after 3 minutes if no or minimal response using a new spray in other nostril.   Quantity: 2 each  Refills: 0     Nepro Liqd  Take 3 Cans by  mouth daily.   Quantity: 30000 mL  Refills: 11     nystatin 100000 UNIT/GM powder  Commonly known as: MYCOSTATIN  Apply topically to affected area three times daily  (Apply 1 Application. topically 3 times daily. Apply to affected area)   Quantity: 15 g  Refills: 0     oxyCODONE 10 MG tablet  Commonly known as: ROXICODONE  Take 1.5 tablets to 2 tablets (15mg  to 20mg ) by mouth every 4 hours as needed for pain   Quantity: 90 tablet  Refills: 0     polyethylene glycol 17 g packet  Commonly known as: MIRALAX  Take 1 packet (17 g) by mouth every 12 hours.   Quantity: 1 each  Refills: 0     pregabalin 100 MG capsule  Commonly known as: LYRICA  Take 1 capsule (100 mg) by mouth at bedtime.   Quantity: 30 capsule  Refills: 2     prochlorperazine 10 MG tablet  Commonly known as: COMPAZINE  Take 1 tablet (10 mg) by mouth every 6 hours as needed (Nausea/Vomiting).   Quantity: 30 tablet  Refills: 5     senna 8.6 MG tablet  Commonly known as: SENOKOT  Take 2 tablets (17.2 mg) by mouth 2 times daily.   Quantity: 30 tablet  Refills: 0            STOP taking these medications      nystatin 100,000 units/mL suspension  Commonly known as: MYCOSTATIN     tamsulosin 0.4 MG capsule  Commonly known as: FLOMAX     tiZANidine 4 MG tablet  Commonly known as: ZANAFLEX     triamcinolone 0.1 % cream  Commonly known as: KENALOG  Where to Get Your Medications        These medications were sent to [facility]  [address], [place] [address]      Hours: Mon-Fri: 8:30am-7:00pm; Sat-Sun & Holidays: 9:00am-5:00pm Phone: [phone]   levoFLOXacin 750 MG tablet  traZODone 50 MG tablet         Allergies:  )  Allergies   Allergen Reactions    Sulfa Drugs Unspecified       Follow Up Appointments:    Already Scheduled:  Future Appointments   Date Time Provider Department Center   05/04/2023 12:45 PM FAST TRACK MUC FAST MUC   05/04/2023  1:30 PM Riley Lam, RD M[facility] Onc MUC   05/04/2023  2:45 PM MUC  INFUSION/CTI MUC Onc Chem MUC   05/29/2023  9:00 AM Bethann Goo., SLP [facility] Sp Ther [place]   05/29/2023  9:30 AM [facility] DIAGNOSTIC ROOM 5 [facility] XRAY [place]   05/29/2023 11:00 AM Shea Evans, MD MUC Onc MUC       PostDischarge Referrals and Orders       Consult to [facility] - Internal      Consult/Referral to Primary Care            Certain appointment types may not appear in this section. Refer to the Post Discharge Referrals section of the After Visit Summary for further information.    Discharging Physician's Contact Information:  [facility] operator at [phone].  I spent >30 min coordinating this patients care for discharge which includes:  -medication reconciliation  -counseling patient on new medications and treatment plan  -coordinating follow up appointments

## 2023-04-28 NOTE — Event / Update (Incomplete)
***    #   Fluid responsive Hypotension   - hypotensive at infusion center, has maintain MAP > 65 without hypoperfusion symptoms since admission. Unclear etiology of hypotensive episode at infusion center. Given pt's immunocompromised status, continue to monitor inpatient while awaiting culture results. Fluid bolus as needed. Seen by Cardiology, ECHO 04/27/2023 reviewed who were not concerned for ACS or other cardiac etiology transient for hypotension.   - follow up orthostatic BP   - medication adjustments discussed with patient. Recommended stopping Flomax given he does not urinate via his urethra anymore since his nephrostomy tube. Also discussed stopping Trazodone, however he was agreeable to decreasing dose to 50mg .     # AKI on CKD in the setting of a solitary kidney: suspecting prerenal. Improving with IVF.   He has stable moderate hydronephrosis with good nephrostomy tube output.   - continue Ivf. Strict I and O      # Rhabdomyolysis: no history of trauma or prolonged immobility  - Improving with fluids.   - history not consistent with myositis given abrupt onset. May be pain mediated, as he reports pain in medial thigh. No physical exam findings concerning for polymyositis. No evidence of mechanic hands. Had good LE and UE strength on my exam without fatigability. Continue to trend CPK.     # Acute encephalopathy: likely toxic/metabolic -resolved      # Possible urinary tract infection: with persistent abnormal urinalysis (although collected from nephrostomy), will opt to treat due to presenting hypotension and immunocompromised state.   - continue levofloxacin IV (based on prior susceptibilities) QTC 409

## 2023-04-28 NOTE — ED Notes (Signed)
Bed: [facility] 08  Expected date:   Expected time:   Means of arrival:   Comments:  40

## 2023-04-28 NOTE — Progress Notes (Signed)
Daily Progress Note    Current Hospital Stay:   1 day - Admitted on: 04/27/2023    24H/Interval Events:  Moderate left hydronephrosis with percutaneous nephrostomy tube in place. This is unchanged compared to 04/20/2023   NAEON    Subjective  States that he feels overall well and improved today. Notes right knee pain that is chronic and feels like his baseline.  Denies any recent or current infectious symptoms. Was in usual state of health until two days ago when he suddenly felt too weak to stand or ambulate with walker. Presented to regularly scheduled chemo and was noted to be hypotensive.     Last Stool Occurrence:     Objective  Temperature:  [97.5 F (36.4 C)-98.3 F (36.8 C)] 98 F (36.7 C) (05/10 0015)  Blood pressure (BP): (96-134)/(53-78) 112/64 (05/10 0412)  Heart Rate:  [47-76] 64 (05/10 0412)  Respirations:  [9-19] 17 (05/10 0412)  Pain Score: 5 (05/10 0412)  O2 Device: None (Room air) (05/10 0015)  SpO2:  [98 %-100 %] 98 % (05/10 0015)     05/09 0600 - 05/10 0559  In: 1000 [I.V.:1000]  Out: 1300 [Urine:1300]    PE   GEN: comfortable appearing, in NAD, sitting upright at the edge of bed  HEENT: NCAT; EOMI; OP clear, MMM  CV: RRR, no M/R/G  Pulm: CTAB, no w/r/r. Symmetric chest wall expansion  Ext: WWP, no C/C/E. Knees non-tender to palpation  Skin: no rashes or lesions    Labs  LABS:  BMP:  CBC:   Recent Labs     04/27/23  1048 04/27/23  2036 04/28/23  0543   NA 138 138 136   K 4.2 4.4 4.2   CL 100 101 102   BICARB 28 26 27    BUN 38* 32* 27*   CREAT 2.37* 1.74* 1.50*   GLU 153* 165* 116*   [place] 8.7 8.8 8.5   MG  --  1.8  --    PHOS  --  4.1  --    BNPP 1,109*  --   --      Recent Labs     04/27/23  1003 04/27/23  1048 04/28/23  0543   WBC 10.5* 8.6 7.9   HGB 8.4* 8.0* 8.0*   HCT 25.0* 23.6* 24.0*   PLT 238 199 210   SEG 81.0 80.3 71.5   LYMPHS 8.7 8.6 18.0   MONOS 9.4 10.0 8.4        Coags:  LFTs:   Recent Labs     04/27/23  1048   PTT 29   INR 1.3   PT 14.4*      Recent Labs     04/27/23  1003  04/27/23  1048 04/28/23  0543   ALK 77 78 71   AST 121* 112* 88*   ALT 41 40 39   TBILI 0.92 0.86 0.71   TP 5.7* 5.8* 5.2*   ALB 3.0* 3.2* 2.7*        ABG  Inflammation:   No results for input(s): "ARTPH", "ARTPCO2", "ARTPO2", "ARTHC03", "ARTBE", "ARTO2SAT" in the last 72 hours.  Recent Labs     04/27/23  1048 04/27/23  1200 04/27/23  1408   TROPONIN 202* 197* 181*   LACTATE 1.2  --   --            Recent Labs     04/26/23  1109 04/27/23  1016   GLUCPOCT 98 172*  ASSESSMENT AND PLAN  72 year old male with a history of solitary L kidney, metastatic urothelial carcinoma ( osseous mets, s/p L PCN 04/2022, currently on Enfortumab vedotin + pembrolizumab, followed by Dr. Roseanne Reno), CKD, BPH s/p TURP, gout and insomnia who was referred to ED from infusion center for hypotension ( BP 45/21), found to have AKI and rhabdomyolysis . Now normotensive with improving kidney function after IV fluids and antibiotics.    # AKI on CKD  # Solitary L kidney ( s/p nephrectomy as a child for hydronephrosis)  # S/p L PCN 04/2021  Pt endorsing poor PO intake for weeks. During recent OSH hospitalization, L nephrostomy exchange on the left side to 8Fr tube on 04/21/2023. Pt reports that L PCN has been draining normally. Renal US obtained showing moderate left hydronephrosis with percutaneous nephrostomy tube (unchanged compared to 04/20/2023). Suspect pre-renal AKI secondary to hypovolemia with concern for possible underlying infection and septic presentation.  Cr 2.37 ( bl 0.99-1.2)  - s/p NS 2L bolus, continue MIVF NS 100 cc/hr for 16 hours x2  - continue to trend Cr  - strict I&O   - avoid nephrotoxic medcaions      # Elevated CPK c/f rhabdomyolysis  No recent trauma. Reports sudden weakness with limited ability to move prior to admission. Unclear if prolonged mobility caused rhabdo or if weakness is secondary to myositis. PT evaluation completed without evidence of fatigability or focal deficits. Overall lower suspicion for myositis. CPK  6231-->5992--> 5616 --> 2891  - Fluid resuscitation as above  - Monitor CPK with AM labs     # Hypotension  # C/f urinary infection   Pt referred to ED from infusion center due to hypotension with BP 45/21, now s/p NS 2L bolus and initiation of levofloxacin. BP now improved and stable. UA and hypotension concerning for possible sepsis however afebrile and no leukocytosis.   UA with 250LE, 21-50 WBC and rare bacteria. Recent urine culture from 5/02 with Enterococcus and Pseudomonas putida   - continue levofloxacin ( 5/9 -  )  - f/u with blood and urine cultures   - Hold blood pressure lowering meds (tamsulosin, trazodone, tizanidine). Consider discontinuing Flomax at time of discharge     # Acute encephalopathy: resolved    # Elevated troponin  Troponin 202-->197-->181. cardiology consulted by ED. Echo completed with normal biventricular function and no wall motion abnormalities. Cardiology assessment is that elevated troponin is more likely related to rhabdomyolysis and do not feel ischemic work up is indicated.   - cardiac monitoring      # Metastatic urothelial carcinoma ( osseous mets, s/p L PCN 04/2022, currently on Enfortumab vedotin + pembrolizumab, followed by Dr. Roseanne Reno)  - message sent to oncologist to inform of admission      # Cancer related pain   - continue with PO oxycodone 5 mg for mod/ 10 mg for severe q4h PRN   - hold home tizanidine 4 mg q8h PRN d/t hypotension  - hold home lyrica due to rhabdomyolysis      # Chronic anemia  Hgb 8.0( bl hgb 8.2-11.9)  - continue to monitor hgb      # BPH s/p TURP  - hold home tamsulosin 0.4 mg      # Gout  - hold home allopurinol d/t AKI     # Insomnia   - hold home trazodone 100 mg nightly     # Level of care  - med/surg tele     #  FEN  - diet mechanical soft      # PPX  - subcutaneous heparin     # Code  - full code       #FEN- Diet Soft and Bite Sized (Mech Soft)-IDDSI-6   #PPx  Foley:  PCN in place    Code status: Full Code     Disposition Planning  04/28/2023:  Days to Complete Medical Plan of Care Requiring Ongoing Hospitalization: Anticipated Tomorrow --->Care Needs: Clinical Improvement/Symptom Control: clinical stability                 (Discharge Planning and EDD Info)  Other remaining discharge needs: None   Discharge location: Home  Outpatient Follow-up Needed: PCM, Oncology      Reva Bores, MD  Family Medicine, PGY3  [facility]        Medications:    heparin  5,000 Units Q12H    levoFLOXacin  500 mg Q24H NR    sodium chloride  3 mL Q8H     IV Meds:    sodium chloride      sodium chloride 100 mL/hr at 04/28/23 0410     PRN Meds:    ondansetron  4 mg Q6H PRN    oxyCODONE  5 mg Q4H PRN    Or    oxyCODONE  10 mg Q4H PRN    oxyCODONE  5 mg Q4H PRN    sodium chloride  3 mL PRN    sodium chloride   Continuous PRN    traZODone  100 mg Nightly PRN

## 2023-04-29 ENCOUNTER — Other Ambulatory Visit: Payer: Self-pay | Admitting: Family Medicine

## 2023-04-29 ENCOUNTER — Other Ambulatory Visit: Payer: Self-pay

## 2023-04-29 DIAGNOSIS — E7800 Pure hypercholesterolemia, unspecified: Secondary | ICD-10-CM

## 2023-04-29 DIAGNOSIS — Z125 Encounter for screening for malignant neoplasm of prostate: Secondary | ICD-10-CM

## 2023-04-29 DIAGNOSIS — R7303 Prediabetes: Secondary | ICD-10-CM

## 2023-04-29 LAB — COMPREHENSIVE METABOLIC PANEL, BLOOD
ALT (SGPT): 48 U/L — ABNORMAL HIGH (ref 0–41)
AST (SGOT): 87 U/L — ABNORMAL HIGH (ref 0–40)
Albumin: 2.8 g/dL — ABNORMAL LOW (ref 3.5–5.2)
Alkaline Phos: 71 U/L (ref 40–129)
Anion Gap: 12 mmol/L (ref 7–15)
BUN: 20 mg/dL (ref 8–23)
Bicarbonate: 24 mmol/L (ref 22–29)
Bilirubin, Tot: 0.61 mg/dL (ref <1.2)
Calcium: 8.6 mg/dL (ref 8.5–10.6)
Chloride: 103 mmol/L (ref 98–107)
Creatinine: 1.17 mg/dL (ref 0.67–1.17)
Glucose: 139 mg/dL — ABNORMAL HIGH (ref 70–99)
Potassium: 4.2 mmol/L (ref 3.5–5.1)
Sodium: 139 mmol/L (ref 136–145)
Total Protein: 5.4 g/dL — ABNORMAL LOW (ref 6.0–8.0)
eGFR Based on CKD-EPI 2021 Equation: >60 mL/min/{1.73_m2}

## 2023-04-29 LAB — CBC WITH DIFF, BLOOD
ANC-Automated: 2.8 10*3/uL (ref 1.6–7.0)
Abs Basophils: 0 10*3/uL (ref <0.2)
Abs Eosinophils: 0.1 10*3/uL (ref 0.0–0.5)
Abs Lymphs: 1.4 10*3/uL (ref 0.8–3.1)
Abs Monos: 0.4 10*3/uL (ref 0.2–0.8)
Basophils: 0.4 %
Eosinophils: 2.5 %
Hct: 24.9 % — ABNORMAL LOW (ref 40.0–50.0)
Hgb: 8.3 gm/dL — ABNORMAL LOW (ref 13.7–17.5)
Imm Gran %: 0.4 % (ref <1)
Lymphocytes: 29.7 %
MCH: 29.1 pg (ref 26.0–32.0)
MCHC: 33.3 g/dL (ref 32.0–36.0)
MCV: 87.4 um3 (ref 79.0–95.0)
MPV: 9.8 fL (ref 9.4–12.4)
Monocytes: 8.2 %
Plt Count: 214 10*3/uL (ref 140–370)
RBC: 2.85 10*6/uL — ABNORMAL LOW (ref 4.60–6.10)
RDW: 17.6 % — ABNORMAL HIGH (ref 12.0–14.0)
Segs: 58.8 %
WBC: 4.7 10*3/uL (ref 4.0–10.0)

## 2023-04-29 LAB — URINE CULTURE

## 2023-04-29 LAB — CPK-CREATINE PHOSPHOKINASE, BLOOD: CPK: 2370 U/L — ABNORMAL HIGH (ref 0–175)

## 2023-04-29 LAB — MRSA SURVEILLANCE CULTURE

## 2023-04-29 MED ORDER — LEVOFLOXACIN 750 MG OR TABS
750.0000 mg | ORAL_TABLET | Freq: Every day | ORAL | 0 refills | Status: AC
Start: 2023-04-29 — End: 2023-05-02
  Filled 2023-04-29: qty 3, 3d supply, fill #0

## 2023-04-29 MED ORDER — TRAZODONE HCL 50 MG OR TABS
ORAL_TABLET | ORAL | 0 refills | Status: DC
Start: 2023-04-29 — End: 2023-06-05
  Filled 2023-04-29: qty 60, 60d supply, fill #0

## 2023-04-29 MED ORDER — HEPARIN SODIUM LOCK FLUSH 100 UNIT/ML IJ SOLN CUSTOM
500.0000 [IU] | Freq: Once | INTRAVENOUS | Status: DC | PRN
Start: 2023-04-29 — End: 2023-04-29

## 2023-04-29 NOTE — ED Notes (Signed)
Bed: 49  Expected date:   Expected time:   Means of arrival:   Comments:  [facility] 8

## 2023-04-29 NOTE — Interdisciplinary (Signed)
04/29/23 1134   Assessment   Assessment Type Face to Face;Progress/Follow-up   Referral Information   Referral Type Other (Comment)  (Consult - pt needs assistance with transport home.  MD plans to d/c today.)     SOCIAL WORK: Consult - pt needs assistance with transport home.  MD plans to d/c today. SW informed pt is ready for d/c.     INFORMATION: H&P 85/40, 72 year old male with a history of solitary L kidney, metastatic urothelial carcinoma ( osseous mets, s/p L PCN 04/2022, currently on Enfortumab vedotin + pembrolizumab, followed by Dr. Roseanne Reno), CKD, BPH s/p TURP, gout and insomnia who was referred to ED from infusion center for hypotension ( BP 45/21), found to have AKI and rhabdomyolysis .      SW met with pt at b/s. Pt seen sitting up in bed, awake/gathering his belongings, dressed in personal clothing. Personal rollator/4WW with seat observed in room. SW introduced self/role, reason for visit. Pt presenting A/OX4, polite/pleasant, confirmed needing assistance with transportation home (address: [address] [place], [place] [address]; resides with sister and niece [sister's dtr], supportive). 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 Single, no children. Identified sister and her dtr as supports, currently unable to assist with transport. Informed SW he is awaiting d/c meds from d/c pharmacy.     SW spoke with primary RN, confirmed pt awaiting d/c meds from d/c pharmacy. Will update SW once pt is ready for transport.     SW to remain available.       Stephen [name], MSW  Clinical Social Worker

## 2023-04-29 NOTE — Interdisciplinary (Addendum)
04/29/23 0950   Discharge Plan   CM Discharge Arrangements Complete Yes   CM met with Pt to discuss pending discharge Yes   Living Arrangements on Admission* Family Member   Patient Engaged in Discharge Planning * Yes   Family/Caregiver's Assessed for * Readiness, willingness, and ability to provide or support self-management activities;Readiness to provide care to the patient   Respite Care * Not Applicable   Patient/Family/Other Are In Agreement With Discharge Plan * Yes   Verified phone number for DC location * Yes   Verified address for DC location * Yes   PATIENT CHOICE: Patient/Family/Legal/Surrogate Decision Maker Has Been Given a List Options And Choice In The Selection of Post-Acute Care Providers Yes   CM discussed the following with pt, and/or family, and/or DPOA [place] has agreements with select post-acute care providers in the collaborative care network;If patient has chosen Retail buyer or [facility] for post-acute care, they were informed that we partner with, and have a financial interest in these organizations   Patient's Top 3 Discharge Facility Choices  [facility]   Public Health Clearance Needed * Not Applicable   If Medicare or Medicare Managed Care: Important Message from Medicare Given   If Medicare or Medicare Managed Care: Important Message from [facility] Given Not Applicable   Discharge Planning Needs   Actions Needed for Discharge Final DC Order - Post-acute care arranged   Do You Have Transportation Issues/Concerns That Make It Difficult To Get To Your Appointments?  No   Primary Family/Caregiver Contact Name, Number and Relationship * Colman Cater, sister, [phone]   Advance Directive: Other (Comment)   Does this patient have CM discharge planning needs? Yes   CM Needs Met? Yes   Does this patient have SW discharge planning needs? No   Information from Case Management (S)  Please call [facility] Phone: [phone] if  you don't hear from them within 48 hours after d/c to set up your first [facility] appt.   Final Discharge Transportation   Transportation*  Family/Friend   Date 04/29/23   Discharge Transportation Details *  family   Team notified of transport  Yes   Final Discharge Destination/Services   Final Discharge Destination/Services * Home Health   Home Health/Home Infusion   Home Health Care Agency * (S)  [facility] PT/[facility]   Phone * (S)  Phone: [phone]     [facility] PT/OT: [facility] Phone: [phone]  has accepted to resume.    Transport: Pt indicated that he will need assistance.  SW consulted.    Bedside Rn and MD notified.

## 2023-04-29 NOTE — Progress Notes (Signed)
Patient d/c at 1209  D/C pharmacy brought over pt meds  Pt educated on med changes.\  VSS  Alert & Oriented x4 on d/c  Iv removed.  Port in place.    Pt ambulates with walker to lyft ride.    Alva Garnet RN

## 2023-04-29 NOTE — Interdisciplinary (Signed)
04/29/23 1206   Assessment   Assessment Type Progress/Follow-up;Face to Face   Referral Information   Referral Type Other (Comment)  (Consult - pt needs assistance with transport home.  MD plans to d/c today. SW informed pt is ready for d/c.)   Social Assessment   Prior to Level of Function * Use Assisted Devices   Assistive Device * Rollator/4 Wheel [facility]   Primary Family/Caregiver Contact Name, Number and Relationship * Sister, Colman Cater [phone] (resides with sister and sister's dtr).   Has discharge transport been arranged? Yes   Transportation Company/Phone Number *  Lyft     SOCIAL WORK: Consult - pt needs assistance with transport home.  MD plans to d/c today. SW informed pt is ready for d/c, d/c meds received. See also previous SW note this encounter.      INFORMATION: H&P 23/80, 72 year old male with a history of solitary L kidney, metastatic urothelial carcinoma ( osseous mets, s/p L PCN 04/2022, currently on Enfortumab vedotin + pembrolizumab, followed by Dr. Roseanne Reno), CKD, BPH s/p TURP, gout and insomnia who was referred to ED from infusion center for hypotension ( BP 45/21), found to have AKI and rhabdomyolysis .      SW f/u with pt at b/s. Pt seen standing by room, utilizing rollator/4WW. Primary RN at b/s, informed SW pt is ready to go (pt's home address: [address] [place], [place] [address]; resides with sister and niece [sister's dtr], supportive).     SW arranged. Later observed pt safely enter Lyft vehicle upon arrival.     Primary RN updated.     SW to remain available.       Luisa Hart, MSW  Clinical Social Worker

## 2023-04-29 NOTE — Discharge Instructions (Addendum)
Diagnosis and Reason for Admission    You were admitted to the hospital for the following reason(s):   LOW BLOOD PRESSURE    Your full diagnosis list is located on this After Visit Summary in the Hospital Problems section.    What Happened During Your Hospital Stay    WE SUSPECT YOUR BLOOD PRESSURE WAS LOW DUE TO URINARY TRACT INFECTION AND MEDICATION.   Take the levofloxacin in the evening with dinner FOR YOUR URINARY TRACT INFECTION   STOP TAKING TIZANIDINE AND FLOMAX FOR NOW. YOU CAN TALK TO YOUR DOCTORS ABOUT RESUMING IN THE FUTURE.   DECREASE TRAZODONE DOSE TO 50MG  AS WELL AS THIS CAN LOWER BLOOD PRESSURE.   YOU WERE ALSO GIVEN FLUIDS TO HELP WITH YOUR KIDNEY FUNCTION WHICH IS NOW NEAR ITS NORMAL BASELINE.     Instructions for After Discharge    Your diet at home should be a regular diet.    Your activity level at home should be:  as much exercise or activity as you can tolerate.    Specific activity restrictions:    None    Wound or tube care instructions:  None    Your medication list is located on this After Visit Summary in the Current Discharge Medication List section.  Your nurse will review this information with you before you leave the hospital.    It is very important for you to keep a current medication list with you in order to assist your doctors with your medical care.  Bring this After Visit Summary with you to your follow up appointments.      Reasons to Contact a Doctor Urgently    Call 911 or return to the hospital immediately if:  RETURN TO THE HOSPITAL FOR HIGH FEVERS >100.4, CONFUSION, BLOOD PRESSURE < 90/60   IF YOU EXPERIENCE WORSENING OF THE SYMPTOMS THAT BROUGHT YOU TO THE HOSPITAL, PLEASE CALL 911 OR RETURN TO THE EMERGENCY DEPARTMENT IMMEDIATELY      You should contact either your primary care physician or your hospital physician for any of the following reasons: MEDICATION REFILL     If you have any questions about your hospital care, your medications, or if you have new or  concerning symptoms soon after going home from the hospital, and you need to contact your hospital physician, your hospital physician can be contacted in the following manner:  [facility] operator at [phone].    Once you are able to see your primary care physician (PCP), your PCP will then be responsible for further medication refills, or appointment referrals.    What Needs to Happen Next After Discharge -- Appointments and Follow Up    Any appointments already scheduled at [facility] clinics will be listed in the Future Appointments section at the top of this After Visit Summary.  Any appointments that have been requested, but have not yet been scheduled, will be listed below that under Post Discharge Referrals.    Sometimes tests performed in the hospital do not yet have results by the time a patient goes home.  The following key tests will need to be followed up at your next appointment: None    Medical Home Information    Your primary care provider or clinic currently on file at [facility] is: Elijah Birk, Davene Costain  You currently have an advance directive or living will on file at : Yes    Handouts Given to You (if applicable)

## 2023-05-01 ENCOUNTER — Other Ambulatory Visit (INDEPENDENT_AMBULATORY_CARE_PROVIDER_SITE_OTHER): Payer: Medicare Other

## 2023-05-01 ENCOUNTER — Telehealth (HOSPITAL_BASED_OUTPATIENT_CLINIC_OR_DEPARTMENT_OTHER): Payer: Self-pay

## 2023-05-01 ENCOUNTER — Telehealth (HOSPITAL_BASED_OUTPATIENT_CLINIC_OR_DEPARTMENT_OTHER): Payer: Self-pay | Admitting: Speech-Language Pathologist

## 2023-05-01 ENCOUNTER — Other Ambulatory Visit: Payer: Self-pay

## 2023-05-01 ENCOUNTER — Encounter (HOSPITAL_BASED_OUTPATIENT_CLINIC_OR_DEPARTMENT_OTHER): Payer: Self-pay | Admitting: Hematology & Oncology

## 2023-05-01 DIAGNOSIS — Z125 Encounter for screening for malignant neoplasm of prostate: Secondary | ICD-10-CM

## 2023-05-01 DIAGNOSIS — E7800 Pure hypercholesterolemia, unspecified: Secondary | ICD-10-CM | POA: Diagnosis not present

## 2023-05-01 DIAGNOSIS — R7303 Prediabetes: Secondary | ICD-10-CM | POA: Diagnosis not present

## 2023-05-01 DIAGNOSIS — C642 Malignant neoplasm of left kidney, except renal pelvis: Secondary | ICD-10-CM

## 2023-05-01 DIAGNOSIS — R1312 Dysphagia, oropharyngeal phase: Secondary | ICD-10-CM

## 2023-05-01 DIAGNOSIS — R490 Dysphonia: Secondary | ICD-10-CM

## 2023-05-01 LAB — COMPREHENSIVE METABOLIC PANEL
ALT: 16 U/L (ref 0–53)
AST: 18 U/L (ref 0–37)
Albumin: 3.9 g/dL (ref 3.5–5.2)
Alkaline Phosphatase: 85 U/L (ref 39–117)
BUN: 14 mg/dL (ref 6–23)
CO2: 29 mEq/L (ref 19–32)
Calcium: 9 mg/dL (ref 8.4–10.5)
Chloride: 103 mEq/L (ref 96–112)
Creatinine, Ser: 1.11 mg/dL (ref 0.40–1.50)
GFR: 67.78 mL/min (ref >60.00)
Glucose, Bld: 92 mg/dL (ref 70–99)
Potassium: 4.8 mEq/L (ref 3.5–5.1)
Sodium: 139 mEq/L (ref 135–145)
Total Bilirubin: 0.7 mg/dL (ref 0.2–1.2)
Total Protein: 6.9 g/dL (ref 6.0–8.3)

## 2023-05-01 LAB — LIPID PANEL
Cholesterol: 145 mg/dL (ref 0–200)
HDL: 53.3 mg/dL (ref >39.00)
LDL Cholesterol: 76 mg/dL (ref 0–99)
NonHDL: 91.5
Total CHOL/HDL Ratio: 3
Triglycerides: 78 mg/dL (ref 0.0–149.0)
VLDL: 15.6 mg/dL (ref 0.0–40.0)

## 2023-05-01 LAB — PSA: PSA: 3.44 ng/mL (ref 0.10–4.00)

## 2023-05-01 LAB — HEMOGLOBIN A1C: Hgb A1c MFr Bld: 5.8 % (ref 4.6–6.5)

## 2023-05-01 NOTE — Telephone Encounter (Signed)
Discharge Follow Up Telephone Encounter:     Subject/Reason: Post-discharge follow up call     Primary Oncologist: Dr. Roseanne Reno   APP:  Franco Nones   RN: Elsworth Soho      Diagnosis: metastatic urothelial carcinoma      Discharged from: ED     Discharge diagnosis: Rhabdomyolysis, AKI      Date of discharge: 5/11      Review Discharge AVS:  Assessed understanding of each topic and used the teach back method to educate.   Reviewed post-discharge instructions   Completed a medication reconciliation with the patient:  Has patient picked up d/c meds? Y  Have they started taking them? Y  Any barriers we can help with? N  Discussed equipment or supplies provided at discharge    Home health services established- Pt to [facility] with [facility] for PT/OT      Symptoms and concerns since discharge:   Symptoms since discharge: Pt reports he is fatigued but overall physically feeling pretty well. States he has a little swelling in his feet but is keeping them elevated throughout the day.     Pt concerns: Pt notes he wants to move up his video swallow study appt. States he called and is hoping they can squeeze him in for a sooner appt. Notes he feels it is getting more difficult to eat and swallow.     Plan if issues arise: The patient states they have the clinic number to use for questions/concerns during business hours, and the after hours number for urgent issues during non-business hours. We reviewed ED precautions, signs and symptoms that would warrant a 911 call or ED visit.      Discharge Follow up::   Discussed scheduling pt for f/u prior to padcev/keytruda scheduled for Thursday, 5/16. Scheduled pt for in person visit with Reena at 9:30am on Wednesday, 5/15.   Pt states he just scheduled his transport for his Thursday appts. States he will call now to schedule transport for the Wednesday appt with Reena and let me know if there are any issues.       Routing to [employer] team as FYI.

## 2023-05-01 NOTE — Patient Instructions (Addendum)
Patient Instructions:     - you will not get treatment tomorrow, we will give hydration     - Schedule a return follow visit with Dr. Roseanne Reno next week- we will reach out with the date and time     - You will have your Labs drawn today     - Ultrasound to rule out DVT  today check in at 1045 for an 11 am appt at [facility]     Future Appointments   Date Time Provider Department Center   05/03/2023 11:00 AM [facility] [name] 2 [facility] [name] [facility]   05/04/2023  1:30 PM Riley Lam, RD MUC Onc MUC   05/04/2023  2:45 PM [facility] INFUSION/CTI MUC Onc Chem MUC   05/29/2023 11:00 AM Shea Evans, MD MUC Onc MUC       Have questions about Advanced Care Planning?  PREPARE ([email or web address])     For [place] lab locations, please visit  [email or web address]  *If Labs completed outside of [place], it is patient's responsibility to bring a copy of results or send lab results to clinic office.     *[place] [facility] Website:  [email or web address]    Contact Information:   Brock Ra, MD   Assistant Professor of Medicine  Medical Oncology  Genitourinary Malignancy    Franco Nones, M.S., PA-C  Sr. Physician Assistant    Clinical questions or concerns  Oncology Nurse Case Manager   Elsworth Soho RN, BSN  T: [phone]  Department of Urology   [facility]  [address]  [place], [place] [address]    Dr. Roseanne Reno Follow Up Appointment Assistance  Administrative Assistant   Terence Lux  T: [phone]  F: [phone]    Monday-Friday 8:00- 4:30p.m. (Closed Holidays and Weekends)  Please listen to message carefully for daily information.   Leave a detailed message with your name, medical record number and   telephone number. Messages are usually returned within 24 hours.     [facility] Phone List     AFTER HOURS EMERGENCY NUMBER   Ask for the Oncologist Physician On-Call.                    [phone]    Outpatient Pavilion Scheduling line: [phone]  Call for information about our new location and to schedule or cancel appointments.    [facility] Scheduling line: [phone]   Call to schedule your [name], CT Scans or MRI studies.    PET Scan Scheduling line: [phone]  Call to schedule your PET scan    Prostate MRI Scheduling line: [phone] or [phone]  Call to schedule your prostate MRI.   Performed at the [facility] ([facility]).    Nuclear Medicine Scheduling Line [phone]   Call to schedule your bone scan, Radium 223     [facility]: [phone]   Call to schedule, cancel, or reschedule chemotherapy appointments.  Infusion Center Hours--- Monday-Friday 7:00-7:30 p.m.; Sat-Sun 8:00-4:30pm.    [facility] ([address]): [phone]  Monday-Friday 730-530 p.m.; Closed [facility]: [phone]  Monday-Friday 8:00-5:00 p.m.  Mail order options available     Radiation Oncology: [phone]    Call to schedule, cancel, or reschedule radiation appointments    Social Worker  [place]: Mathews Robinsons, [place]    T: [phone]  [facility]: Henri Medal, LCSW  T: [phone]  Financial Counselors   [phone] at [facility]   [phone] or [phone]    [facility]  [address], [place] [address]    -You can also reach [name] by MyChart. To set up your MyChart please see the following site: [email or web address]  -For cancer support services, please visit out Patient and [facility].  -For [facility] support services, please visit:  -[email or web address]  -For other support from the American Cancer Society, please visit: [email or web address]/[email or web address]/index  -Advanced care planning: [email or web address]      Thank you for  allowing [name] to participate in your care today! We value your feedback as we strive to provide every patient with an exceptional care experience.     You may receive a patient satisfaction survey in the mail in the next few weeks. Please fill out the survey upon receipt and return it in the self-addressed envelope. Your feedback will be used to help improve the experience for all our patients at [facility].     If you want to share a positive experience you had at our facility please call We Listen at [phone]. If you have any suggestions on how we can improve our services please call the [facility] leadership team at [phone].    For medical questions or emergencies, please call your physician's office or 911. Thank you!

## 2023-05-01 NOTE — Telephone Encounter (Signed)
Dr. Roseanne Reno,     Stephen [name] is scheduled for infusion of Padcev/Keytruda on 5/16.       Please review the treatment plan and change the date to reflect patient's scheduled appointment on 5/16      Thank you,   Owens Shark

## 2023-05-01 NOTE — Telephone Encounter (Signed)
Pt calling in as he states he can't wait until June to complete his VSS. Pt stated he is  a chemo pt and his throat is closing out; bad and tight. He has to swallow very slowly and has a hard time eating.     Clinic please review and advise,    Thank you !

## 2023-05-02 ENCOUNTER — Encounter: Payer: Self-pay | Admitting: Hospital

## 2023-05-02 LAB — BLOOD CULTURE
Blood Culture Result: NO GROWTH
Blood Culture Result: NO GROWTH

## 2023-05-02 NOTE — Progress Notes (Signed)
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 Multiple osseous lesions involving the bilateral iliac crest and right acetabulum concerning for osseous metastatic disease.   11/26/22  PET/CT: diffuse osseous FDG activity with discrete osseous lesions concerning for metastatic disease.  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  Also treated for septic shock, AKI (Cr 2.5) and acute liver injury (AST/ALT 2800/2100).   S/p exchange L PCN.   02/07/23  start XRT to R Shoulder  02/10/23  C2D1 EV (dose reduced to 1.0 mg/kg) + pembro  200 mg IV  03/03/23  C3D1 EV/pembro, missed D8  03/06/23  Cysto: NED.  Cytology NED.   03/14/23  Seen at [facility] ED for dislodged tube, replaced  04/02-05/24  Admitted with weakness, poor PO intake.   04/06/23  C4D1 EV/pembro  04/13/23  C4D8 EV  05/09-11/24  Admitted for treatment of AKI and rhabdomyolysis    Interim History:  Here today prior D8 infusion  R arm is ok, needs to f/u with rad onc to complete treatment  Compliant with skin hygiene (cetaphil)  Reports resolving groin skin rash for which he is using "some sort of cream"  Appetite better, gaining weight; eating small meals daily  Pain relatively well controlled; he takes oxycodone  Back pain is a bit better - taking Zanaflex (for muscle spasm) which helps - has Mri scheduled next week  Denies hematuria; L PCN draining well  Reports a "burning type of numbness" in his thighs bilaterally; this is intermittent. Denies numbness of hands/feet.  He's still concerned about his knees which remain bothersome  No change in meds    Review of Systems:   A complete ROS was performed and is negative except as documented above.     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  Non smoker  No etoh    Medications:  Current Outpatient Medications on File Prior to Visit   Medication Sig Dispense Refill    acetaminophen (TYLENOL) 500 MG tablet Take 2 tablets (1,000 mg) by mouth every 8 hours as needed for Mild Pain (Pain Score 1-3) or Moderate Pain (Pain Score 4-6). 40 tablet 0    allopurinol (ZYLOPRIM) 100 MG tablet Take 1 tablet (100 mg) by mouth daily. 30 tablet 0    diclofenac (VOLTAREN) 1 % gel Apply 2 g topically every 6 hours as needed (Right knee pain). 1 each 0    DULoxetine (CYMBALTA)  20 MG CR capsule Take 1 capsule (20 mg) by mouth daily. 90 capsule 3    [DISCONTINUED] gabapentin (NEURONTIN) 100 MG capsule Take 1 capsule (100 mg) by mouth 3 times daily for 7 days. Start taking after your procedure is completed. 21 capsule 0    ketotifen (ALAWAY) 0.025 % ophthalmic solution Place 1 drop into both eyes 2 times daily.      levoFLOXacin (LEVAQUIN) 750 MG tablet Take 1 tablet (750 mg) by mouth daily for 3 days. 3 tablet 0    lidocaine (ASPERCREME) 4 % patch Apply 1 patch topically every 24 hours. Leave patch on for 12 hours, then remove for 12 hours. 30 patch 0    Multiple Vitamins-Minerals (MULTIVITAMIN WITH MINERALS) TABS tablet Take 1 tablet by mouth daily. 30 tablet 1    naloxone (NARCAN) 4 mg/0.1 mL nasal spray For suspected opioid overdose, call 911! Then spray once in one nostril. Repeat after 3 minutes if no or minimal response using a new spray in other nostril. 2 each 0    Nutritional Supplements (NEPRO) LIQD Take 3 Cans by mouth daily. 30000 mL 11    [DISCONTINUED] nystatin (MYCOSTATIN) 100,000 units/mL suspension 5 mL (500,000 Units) by Swish & Swallow route 4 times daily. 280 mL 0    [DISCONTINUED] nystatin (MYCOSTATIN) 100,000 units/mL suspension Take 5 mL (500,000 Units) by mouth 4 times daily. 280 mL 0    nystatin (MYCOSTATIN) 100000 UNIT/GM powder Apply 1 Application. topically 3 times daily. Apply to affected area 15 g 0    oxyCODONE (ROXICODONE) 10 MG tablet Take 1.5 tablets to 2 tablets (15mg  to 20mg ) by mouth every 4 hours as needed for pain 90 tablet 0    polyethylene glycol (MIRALAX) 17 g packet Take 1 packet (17 g) by mouth every 12 hours. 1 each 0    pregabalin (LYRICA) 100 MG capsule Take 1 capsule (100 mg) by mouth at bedtime. 30 capsule 2    prochlorperazine (COMPAZINE) 10 MG tablet Take 1 tablet (10 mg) by mouth every 6 hours as needed (Nausea/Vomiting). 30 tablet 5    senna (SENOKOT) 8.6 MG tablet Take 2 tablets (17.2 mg) by mouth 2 times daily. 30 tablet 0     [DISCONTINUED] tamsulosin (FLOMAX) 0.4 MG capsule TAKE 1 CAPSULE BY MOUTH EVERY DAY 90 capsule 1    [DISCONTINUED] tiZANidine (ZANAFLEX) 4 MG tablet Take 1 tablet (4 mg) by mouth every 8 hours as needed (muscle spasm). 90 tablet 0    traZODone (DESYREL) 50 MG tablet Take 1 tablets (50mg ) by mouth at bedtime as needed for insomnia 60 tablet 0    [DISCONTINUED] traZODone (DESYREL) 50 MG tablet Take 1-2 tablets (50mg  to 100mg ) by  mouth at bedtime as needed for insomnia 60 tablet 0    [DISCONTINUED] triamcinolone (KENALOG) 0.1 % cream Apply 1 Application. topically 2 times daily. Apply a thin layer as directed 1 each 0     No current facility-administered medications on file prior to visit.       Physical Examination:  There were no vitals filed for this visit.    ECOG PS 1  General: Appears chronically ill, some tangetial conversation which has been consistent during treatment.  HEENT: Moist mucous membranes. +Thrush.  Neck: No thyromegaly or lymphadenopathy.   Chest: Clear to ausculation bilaterally. No wheezes, rales or rhonchi.    Cardiac: Regular rate and rhythm without any murmurs, rubs or gallops.   Abdomen: Abd soft, nontender without any hepatosplenomegaly.   Extremities: No clubbing, cyanosis, or edema.  Skin: Resolving bil inguinal candidal rash.   Neurological: Awake, alert and oriented x 3. Strength and sensation are grossly intact.   Psychologic: Appropriate mood and affect.  GU: L PCN site clean and dry, draining clear, yellow urine.     Labs: Following labs reviewed  Lab Results   Component Value Date    WBC 4.7 04/29/2023    RBC 2.85 (L) 04/29/2023    HGB 8.3 (L) 04/29/2023    HCT 24.9 (L) 04/29/2023    MCV 87.4 04/29/2023    MCHC 33.3 04/29/2023    RDW 17.6 (H) 04/29/2023    PLT 214 04/29/2023    MPV 9.8 04/29/2023      Lab Results   Component Value Date    BUN 20 04/29/2023    CREAT 1.17 04/29/2023    CL 103 04/29/2023    NA 139 04/29/2023    K 4.2 04/29/2023    [place] 8.6 04/29/2023    TBILI 0.61  04/29/2023    ALB 2.8 (L) 04/29/2023    TP 5.4 (L) 04/29/2023    AST 87 (H) 04/29/2023    ALK 71 04/29/2023    BICARB 24 04/29/2023    ALT 48 (H) 04/29/2023    GLU 139 (H) 04/29/2023                Assessment:  Stephen [name] is a 72 year old male with OA, HTN, MDD with L UTUC with metastatic disease ([id number]), with osseous mets (Pet/CT with diffuse enhancement and discrete lesions). FGFR-TACC mutation.  Started on EV+Pembro.    I personally reviewed labs as above.  No new imaging to review for today's visit.     Here today for follow-up prior to D8 EV. Doing relatively well. Back pain somewhat improved with Zanaflex. Mri lower back scheduled 4/28. R arm pain stable; will contact rad onc to complete palliative xrt. PE revealed intraoral thrush and bil inguinal candida - rx for nystatin swish/swallow + nystatin powder sent to [facility]. Ok for infusion today. Needs PET CT to complete restaging - he will call radiology to schedule this. RTC 5/09 with Dr. Roseanne Reno.     Plan:  RTC 1-2 weeks with Dr. Roseanne Reno  Repeat labs today to include CPK    Advanced Urothelial Carcinoma, [id number]  - Proceed with C4D8 EV today  - Treatment plan:  --- Enfortumab vedotin 1.25 --> 1.0 mg/kg (dose reduced prior to C2) D1, D8 Q21d  --- Pembrolizumab 200 mg D1 Q21d  --- prn anti-nausea meds  --- prn antidiarrheal medications  --- prn topical skin care  --- ED precautions  - RTC 5/09 with Dr. Roseanne Reno prior to C5  -  MRI lumbar spine 4/28  - PET/CT, pt needs to schedule  - consider adding zometa given significnt bone disease; needs vit D level checked and dental clearance - he's known to have significant dental issues so this needs to be cleared first  - nutrition referral    Cancer related pain  - follows with palliative care  - continue intermittent oxycodone.   - zanaflex for muscle spasm    R shoulder pain  - 2/2 metastatic lesion  - seen by ortho 1/22  - needs fllow up with rad onc to get XRT restarted  - seen by palliative care  team (Dr. Margarita Mail) 2/05     L PCN, initially placed 04/2022  - tube exchange planned next exchange in 04/2023  - draining well, will monitor    Candida  - nystatin swish and swallow QID  - nystatin powder bil groin prn      Reviewed plan with the patient.  All questions answered to patient's satisfaction.  Patient verbalized understanding and agreement with above plan.      Today's visit lasted 30 minutes - including charting, communication with other doctors or caregivers, review of labs, pertinent imaging and test results; treatment planning including note completion, order entry, and coordination of care - and consisted more than 50% of time in the face-to-face patient counseling.       Franco Nones, PA-C  Senior Physician Assistant  [facility]    Supervising Physician:  Dr. Brock Ra      [facility]  [facility]  [address]  [place], [place] [address]  [phone]

## 2023-05-02 NOTE — Telephone Encounter (Signed)
Left message for pt to call back. To relay message below per clinic

## 2023-05-02 NOTE — Telephone Encounter (Signed)
S.w pt and relayed information below. Pt was then transferred over to [facility] with Speech Pathology dept.    Thank you

## 2023-05-03 ENCOUNTER — Encounter (HOSPITAL_BASED_OUTPATIENT_CLINIC_OR_DEPARTMENT_OTHER): Payer: Self-pay | Admitting: Physician Assistant

## 2023-05-03 ENCOUNTER — Telehealth (HOSPITAL_BASED_OUTPATIENT_CLINIC_OR_DEPARTMENT_OTHER): Payer: Self-pay

## 2023-05-03 ENCOUNTER — Ambulatory Visit (HOSPITAL_BASED_OUTPATIENT_CLINIC_OR_DEPARTMENT_OTHER)
Admission: RE | Admit: 2023-05-03 | Discharge: 2023-05-03 | Disposition: A | Discharge status: Home Health | Payer: Medicare Other

## 2023-05-03 ENCOUNTER — Ambulatory Visit
Payer: Medicare Other | Attending: Student in an Organized Health Care Education/Training Program | Admitting: Physician Assistant

## 2023-05-03 ENCOUNTER — Encounter (HOSPITAL_BASED_OUTPATIENT_CLINIC_OR_DEPARTMENT_OTHER): Payer: Self-pay | Admitting: Hematology & Oncology

## 2023-05-03 ENCOUNTER — Other Ambulatory Visit (INDEPENDENT_AMBULATORY_CARE_PROVIDER_SITE_OTHER): Payer: Medicare Other

## 2023-05-03 VITALS — BP 118/63 | HR 63 | Temp 97.6°F | Resp 15 | Ht 66.0 in | Wt 157.3 lb

## 2023-05-03 DIAGNOSIS — E079 Disorder of thyroid, unspecified: Secondary | ICD-10-CM

## 2023-05-03 DIAGNOSIS — C689 Malignant neoplasm of urinary organ, unspecified: Secondary | ICD-10-CM | POA: Insufficient documentation

## 2023-05-03 DIAGNOSIS — C7910 Secondary malignant neoplasm of unspecified urinary organs: Secondary | ICD-10-CM

## 2023-05-03 DIAGNOSIS — E860 Dehydration: Secondary | ICD-10-CM

## 2023-05-03 DIAGNOSIS — R600 Localized edema: Secondary | ICD-10-CM

## 2023-05-03 LAB — COMPREHENSIVE METABOLIC PANEL, BLOOD
ALT (SGPT): 48 U/L — ABNORMAL HIGH (ref 0–41)
AST (SGOT): 55 U/L — ABNORMAL HIGH (ref 0–40)
Albumin: 3.7 g/dL (ref 3.5–5.2)
Alkaline Phos: 78 U/L (ref 40–129)
Anion Gap: 11 mmol/L (ref 7–15)
BUN: 35 mg/dL — ABNORMAL HIGH (ref 8–23)
Bicarbonate: 30 mmol/L — ABNORMAL HIGH (ref 22–29)
Bilirubin, Tot: 0.37 mg/dL (ref <1.2)
Calcium: 9.7 mg/dL (ref 8.5–10.6)
Chloride: 100 mmol/L (ref 98–107)
Creatinine: 1.48 mg/dL — ABNORMAL HIGH (ref 0.67–1.17)
Glucose: 110 mg/dL — ABNORMAL HIGH (ref 70–99)
Potassium: 4.5 mmol/L (ref 3.5–5.1)
Sodium: 141 mmol/L (ref 136–145)
Total Protein: 6.8 g/dL (ref 6.0–8.0)
eGFR Based on CKD-EPI 2021 Equation: 51 mL/min/{1.73_m2}

## 2023-05-03 LAB — CBC WITH DIFF, BLOOD
ANC-Automated: 6.5 10*3/uL (ref 1.6–7.0)
Abs Basophils: 0 10*3/uL (ref <0.2)
Abs Eosinophils: 0.1 10*3/uL (ref 0.0–0.5)
Abs Lymphs: 1.3 10*3/uL (ref 0.8–3.1)
Abs Monos: 0.4 10*3/uL (ref 0.2–0.8)
Basophils: 0.5 %
Eosinophils: 1 %
Hct: 28.7 % — ABNORMAL LOW (ref 40.0–50.0)
Hgb: 9.4 gm/dL — ABNORMAL LOW (ref 13.7–17.5)
Imm Gran %: 0.4 % (ref <1)
Lymphocytes: 16 %
MCH: 29.3 pg (ref 26.0–32.0)
MCHC: 32.8 g/dL (ref 32.0–36.0)
MCV: 89.4 um3 (ref 79.0–95.0)
MPV: 9.4 fL (ref 9.4–12.4)
Monocytes: 4.8 %
Plt Count: 287 10*3/uL (ref 140–370)
RBC: 3.21 10*6/uL — ABNORMAL LOW (ref 4.60–6.10)
RDW: 17.7 % — ABNORMAL HIGH (ref 12.0–14.0)
Segs: 77.3 %
WBC: 8.4 10*3/uL (ref 4.0–10.0)

## 2023-05-03 LAB — CPK-CREATINE PHOSPHOKINASE, BLOOD: CPK: 650 U/L — ABNORMAL HIGH (ref 0–175)

## 2023-05-03 LAB — CORTISOL, BLOOD: Cortisol: 19.3 ug/dL

## 2023-05-03 LAB — FREE THYROXINE, BLOOD: Free T4: 1.05 ng/dL (ref 0.93–1.70)

## 2023-05-03 LAB — TSH, BLOOD: TSH: 2.58 u[IU]/mL (ref 0.27–4.20)

## 2023-05-03 MED ORDER — LORAZEPAM 0.5 MG OR TABS
ORAL_TABLET | ORAL | Status: DC
Start: ? — End: 2023-05-11

## 2023-05-03 MED ORDER — SENNOSIDES-DOCUSATE SODIUM 8.6-50 MG OR TABS
2.00 | ORAL_TABLET | ORAL | Status: DC
Start: 2023-03-14 — End: 2023-05-11

## 2023-05-03 MED ORDER — OXYCODONE-ACETAMINOPHEN 5-325 MG OR TABS
ORAL_TABLET | ORAL | Status: DC
Start: ? — End: 2023-05-11

## 2023-05-03 MED ORDER — VITAMIN D3 2000 UNIT OR CAPS
ORAL_CAPSULE | ORAL | Status: DC
Start: ? — End: 2023-05-11

## 2023-05-03 MED ORDER — VITAMIN C 500 MG PO CAPS
ORAL_CAPSULE | ORAL | Status: DC
Start: ? — End: 2023-05-11

## 2023-05-03 MED ORDER — AMITRIPTYLINE HCL 50 MG OR TABS
ORAL_TABLET | ORAL | Status: DC
Start: ? — End: 2023-05-11

## 2023-05-03 MED ORDER — DIAZEPAM 10 MG OR TABS
ORAL_TABLET | ORAL | Status: DC
Start: ? — End: 2023-05-11

## 2023-05-03 MED ORDER — ALBUTEROL SULFATE 108 (90 BASE) MCG/ACT IN AERS: INHALATION_SPRAY | RESPIRATORY_TRACT | Status: AC

## 2023-05-03 MED ORDER — UMECTA PD 40-0.3 % EX SUSP
CUTANEOUS | Status: DC
Start: ? — End: 2023-05-11

## 2023-05-03 MED ORDER — FLUTICASONE PROPIONATE 50 MCG/ACT NA SUSP
2.00 | NASAL | Status: AC
Start: 2022-03-25 — End: ?

## 2023-05-03 MED ORDER — CHLORHEXIDINE GLUCONATE 0.12 % MT SOLN
OROMUCOSAL | Status: DC
Start: ? — End: 2023-05-11

## 2023-05-03 MED ORDER — CYCLOBENZAPRINE HCL 5 MG OR TABS
ORAL_TABLET | ORAL | Status: DC
Start: ? — End: 2023-05-11

## 2023-05-03 MED ORDER — IBUPROFEN 600 MG OR TABS
ORAL_TABLET | ORAL | Status: AC
Start: 2023-01-06 — End: ?

## 2023-05-03 MED ORDER — PROTONIX 40 MG OR TBEC
DELAYED_RELEASE_TABLET | ORAL | Status: DC
Start: 2023-04-23 — End: 2023-05-11

## 2023-05-03 MED ORDER — LORATADINE 10 MG OR TBDP
ORAL_TABLET | ORAL | Status: AC
Start: 2023-03-29 — End: ?

## 2023-05-03 MED ORDER — SOOTHE XP OP
OPHTHALMIC | Status: AC
Start: 2020-04-14 — End: ?

## 2023-05-03 NOTE — Interdisciplinary (Signed)
Two patient identifiers completed, Stephen [name] [birth year]  Rooming, vital signs, intake completed

## 2023-05-04 ENCOUNTER — Encounter (HOSPITAL_BASED_OUTPATIENT_CLINIC_OR_DEPARTMENT_OTHER): Payer: Self-pay

## 2023-05-04 ENCOUNTER — Other Ambulatory Visit (HOSPITAL_BASED_OUTPATIENT_CLINIC_OR_DEPARTMENT_OTHER): Payer: Self-pay | Admitting: Surgery

## 2023-05-04 ENCOUNTER — Ambulatory Visit (HOSPITAL_BASED_OUTPATIENT_CLINIC_OR_DEPARTMENT_OTHER): Payer: Medicare Other

## 2023-05-04 ENCOUNTER — Telehealth (HOSPITAL_BASED_OUTPATIENT_CLINIC_OR_DEPARTMENT_OTHER): Payer: Self-pay

## 2023-05-04 ENCOUNTER — Encounter (HOSPITAL_BASED_OUTPATIENT_CLINIC_OR_DEPARTMENT_OTHER): Payer: Self-pay | Admitting: Family Medicine

## 2023-05-04 ENCOUNTER — Ambulatory Visit
Admission: RE | Admit: 2023-05-04 | Discharge: 2023-05-04 | Disposition: A | Discharge status: Home / Self Care | Payer: Medicare Other | Attending: Hematology & Oncology | Admitting: Hematology & Oncology

## 2023-05-04 ENCOUNTER — Other Ambulatory Visit: Payer: Self-pay

## 2023-05-04 VITALS — BP 109/79 | HR 60 | Temp 98.1°F | Resp 16 | Ht 66.0 in | Wt 157.6 lb

## 2023-05-04 DIAGNOSIS — C7910 Secondary malignant neoplasm of unspecified urinary organs: Secondary | ICD-10-CM

## 2023-05-04 DIAGNOSIS — C689 Malignant neoplasm of urinary organ, unspecified: Secondary | ICD-10-CM | POA: Insufficient documentation

## 2023-05-04 DIAGNOSIS — N1330 Unspecified hydronephrosis: Secondary | ICD-10-CM

## 2023-05-04 DIAGNOSIS — Z5189 Encounter for other specified aftercare: Secondary | ICD-10-CM

## 2023-05-04 DIAGNOSIS — C642 Malignant neoplasm of left kidney, except renal pelvis: Secondary | ICD-10-CM | POA: Insufficient documentation

## 2023-05-04 DIAGNOSIS — F419 Anxiety disorder, unspecified: Secondary | ICD-10-CM

## 2023-05-04 LAB — CBC WITH DIFF, BLOOD
ANC-Automated: 4.9 10*3/uL (ref 1.6–7.0)
ANC-Instrument: 4.9 10*3/uL (ref 1.6–7.0)
Abs Basophils: 0 10*3/uL (ref <0.2)
Abs Eosinophils: 0.1 10*3/uL (ref 0.0–0.5)
Abs Lymphs: 1.5 10*3/uL (ref 0.8–3.1)
Abs Monos: 0.5 10*3/uL (ref 0.2–0.8)
Basophils: 0.3 %
Eosinophils: 1.7 %
Hct: 25.4 % — ABNORMAL LOW (ref 40.0–50.0)
Hgb: 8.5 gm/dL — ABNORMAL LOW (ref 13.7–17.5)
Imm Gran %: 0.4 % (ref <1)
Lymphocytes: 21.1 %
MCH: 29 pg (ref 26.0–32.0)
MCHC: 33.5 g/dL (ref 32.0–36.0)
MCV: 86.7 um3 (ref 79.0–95.0)
MPV: 9.6 fL (ref 9.4–12.4)
Monocytes: 6.5 %
Plt Count: 283 10*3/uL (ref 140–370)
RBC: 2.93 10*6/uL — ABNORMAL LOW (ref 4.60–6.10)
RDW: 17.9 % — ABNORMAL HIGH (ref 12.0–14.0)
Segs: 70 %
WBC: 6.9 10*3/uL (ref 4.0–10.0)

## 2023-05-04 LAB — COMPREHENSIVE METABOLIC PANEL, BLOOD
ALT (SGPT): 37 U/L (ref 0–41)
AST (SGOT): 38 U/L (ref 0–40)
Albumin: 3.5 g/dL (ref 3.5–5.2)
Alkaline Phos: 76 U/L (ref 40–129)
Anion Gap: 12 mmol/L (ref 7–15)
BUN: 34 mg/dL — ABNORMAL HIGH (ref 8–23)
Bicarbonate: 28 mmol/L (ref 22–29)
Bilirubin, Tot: 0.26 mg/dL (ref <1.2)
Calcium: 9.2 mg/dL (ref 8.5–10.6)
Chloride: 101 mmol/L (ref 98–107)
Creatinine: 1.21 mg/dL — ABNORMAL HIGH (ref 0.67–1.17)
Glucose: 141 mg/dL — ABNORMAL HIGH (ref 70–99)
Potassium: 4.2 mmol/L (ref 3.5–5.1)
Sodium: 141 mmol/L (ref 136–145)
Total Protein: 6.3 g/dL (ref 6.0–8.0)
eGFR Based on CKD-EPI 2021 Equation: >60 mL/min/{1.73_m2}

## 2023-05-04 MED ORDER — HEPARIN SODIUM LOCK FLUSH 100 UNIT/ML IJ SOLN CUSTOM
500.0000 [IU] | INTRAVENOUS | Status: DC | PRN
Start: 2023-05-04 — End: 2023-05-04
  Administered 2023-05-04: 500 [IU] via INTRAVENOUS

## 2023-05-04 MED ORDER — HYDROXYZINE HCL 25 MG OR TABS
25.0000 mg | ORAL_TABLET | Freq: Three times a day (TID) | ORAL | 0 refills | Status: DC | PRN
Start: 2023-05-04 — End: 2023-05-17
  Filled 2023-05-04: qty 30, 10d supply, fill #0

## 2023-05-04 MED ORDER — SODIUM CHLORIDE 0.9 % IJ SOLN (CUSTOM)
20.0000 mL | INTRAMUSCULAR | Status: DC | PRN
Start: 2023-05-04 — End: 2023-05-04
  Administered 2023-05-04: 20 mL via INTRAVENOUS

## 2023-05-04 MED ORDER — SODIUM CHLORIDE 0.9 % IV BOLUS
1000.0000 mL | INJECTION | Freq: Once | INTRAVENOUS | Status: AC
Start: 2023-05-04 — End: 2023-05-04
  Administered 2023-05-04: 1000 mL via INTRAVENOUS

## 2023-05-04 NOTE — Telephone Encounter (Signed)
RN called pt to offer RV appt with Dr. Roseanne Reno on 5/23.    He confirms he can make 5/23 at 9 am.      Pt is requesting a prescription for anti-anxiety medication multiple times during phone call.  Pt also sent a mcyhart message yesterday, which was routed to Dr. Roseanne Reno and PA Valentino Saxon.  RN stated she will get back to pt as soon as they message back.  RN relayed that he can call or message the palliative care department for a more immediate response.      Per 5/15 OV:  Reports significant anxiety which is affecting his QoL. Will contact palliative care for assistance     Per pt request, RN reviewed negative [name] results from 5/15.      Message routed to Oncology and [facility] team for anti-anxiety advisement.    Message  Received: Today  Elsworth Soho, RN  P Muc Infusion Schedulers  Cancer Center Appointment Requests  Location: [facility]  MD Provider: Brock Ra  Type of Appointment: Infusion    Infusion Request  [facility]: [facility]  Treatment: Enfortumab + Pembro  Does the patient have a PICC or a PORT? yes  Acuity Level: 2  Length of Treatment: 1-2 hrs  Date(s) Requested: 5/23 at 10 am (2 hrs), 5/30 (1 hr)  Clinic / MD Appointment Same Day? yes    Pre-Chemo Labs: yes  Date(s) Requested: 5/23 and 5/30    Comments: Please call the patient to schedule, thank you!

## 2023-05-04 NOTE — Telephone Encounter (Signed)
Call back received from the pt.  He is currently at [facility] for his Infusion.  He would prefer for medication to be sent to [facility] if it can be sent today before 5 pm.    If it can't, he would like it to be sent to [facility] off of [address].

## 2023-05-04 NOTE — Telephone Encounter (Signed)
RN called infusion center to see if pt was still there.  Per infusion, pt has not showed up for his appt today. Appt scheduled at 2:45 pm.    RN called pt to see which pharmacy he would prefer medication to be sent to, no answer so RN LVM.

## 2023-05-04 NOTE — Interdisciplinary (Signed)
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     Patient presents today prior to D8 EV. Still struggling with a variety of issues, however, we have a comprehensive plan in place to address the following: Pain management per Palliative Care - pt encouraged to restart methadone/pain meds per their recommendations. Pill box to facilitate med organization. Palliative XRT to R glenohumeral met s/p SIM on 2/07; possible start date next week. PCN exchange planned for 2/14. Suspect nausea may be due to constipation - start docusate bid and cont senna for BM's. Port placement planned for 2/26. RTC in 2 weeks with Dr. Roseanne Reno prior to C2   ______________________________________________________________________  ______________________________________________________________________      Chief Complaint [Nutritional]: Nutritional Optimization    RD Assessment: Pt presents unaccompanied to clinic visit. Pt reports improvement in appetite since last visit, making an effort to eat small amounts throughout the day, and his sister is helping his with preparing meals and snacks for him. He continues to eat soft foods d/t not being able to wear his dentures caused by changes in his mouth r/t his weight loss. Pt also reports neck and throat tightness that he occasionally experiences causing his throat to "shut down" and he isn't able to eat when this happens, relates to increased stress levels, pt has appointment  w/ SLP next week. Ongoing dysgeusia, specifically coffee doesn't takes good anymore.     Recall:   B: banana, yogurt, cereal and  juice  SN: Nepro  Also snacks throughout the day on: cottage cheese, pudding, apple sauce, sandwiches  Fluids: states he is not drinking a lot of water, is doing more sprite, cranberry juice and Gatorade     Pt trying to incorporate activity as able to support strength regain, takes his dog for short walks most days.     Pt mentioned increased anxiety in general and is actively working w/ medical team for antianxiety rx. Pt open to having SW reach out.     Reviewed importance of preventing further weight loss during treatment. Encouraged small frequent meals every 2-3 hours throughout the day and choosing high calorie high protein food and liquids choices to aid in meeting nutrient needs and maximizing intake. Recommended increasing to 3 nepro/day to aid in meeting nutrient needs. Reviewed importance of adequate hydration, encouraged liquids w/ calories such as pedilyte, whole milk, etc. Encouraged pt to continue supportive medication management as prescribed to aid in symptom management as able. Pt v/u, will continue to follow closely during treatment.        GI: Denies N/V/D/C, last BM was this morning. Reports he continues to lean more towards constipation/slow bowel movements. Pt reports improvement in pain but continues to take oxycodone daily.       Diet Type: oral    Estimated Nutrition Needs:       Calories: 2250-2625 kcal (30-35 kcal/kg)  Protein: 90-112 gm (1.2-1.5 gm/kg)     Fluids: 1 mL/kcal OR per MD            Food allergies/intolerances: NKFA     Supplements: MVI    Education / Intervention:  Focus on scheduled eating pattern of small freq meals every 2-3 hours throughout the day  Choose high calorie high protein foods to maximize intake  Recommend oral nutrition supplement to support meeting nutrient needs: Nepro 3x/day as warranted   Continue working on hydration  status  Referral message sent to SW    Goals: - Pt to demonstrate intake of >80% estimated needs with good tolerance as evidenced by pt report.        ____________________________________________________________________________________________________________________________________________    Vitals:  Ht:   Height - Most Recent Measurement   05/03/23 5\' 6"  (1.676 m)      Wt.:  Pt presents with additional wt loss, reports weight  recently fluctuating between: 154#, - 156#  02/23/23: pt reports ~20# weight loss in the last month (significant 12.5% weight loss in 1 month)  Wt Readings from Last 1 Encounters:   05/03/23 71.4 kg (157 lb 4.8 oz)           BMI: Estimated body mass index is 25.39 kg/m as calculated from the following:    Height as of 05/03/23: 5\' 6"  (1.676 m).    Weight as of 05/03/23: 71.4 kg (157 lb 4.8 oz).  Wt hx: UBW: 184#  Wt Readings from Last 10 Encounters:   05/03/23 71.4 kg (157 lb 4.8 oz)   04/27/23 74.1 kg (163 lb 5.8 oz)   04/20/23 73.5 kg (162 lb)   04/13/23 71.7 kg (158 lb 1.6 oz)   04/13/23 71.7 kg (158 lb 1.6 oz)   04/10/23 71.5 kg (157 lb 9.6 oz)   04/06/23 75.2 kg (165 lb 12.6 oz)   04/06/23 75.2 kg (165 lb 12.6 oz)   04/03/23 71.2 kg (157 lb)   03/24/23 68.8 kg (151 lb 9.6 oz)         Labs:  Lab Results   Component Value Date    WBC 8.4 05/03/2023    RBC 3.21 (L) 05/03/2023    HGB 9.4 (L) 05/03/2023    HCT 28.7 (L) 05/03/2023    MCV 89.4 05/03/2023    MCHC 32.8 05/03/2023    RDW 17.7 (H) 05/03/2023    PLT 287 05/03/2023    MPV 9.4 05/03/2023    ABSNEUTRO 6.5 05/03/2023    IANC 8.5 (H) 04/27/2023     Lab Results   Component Value Date    NA 141 05/03/2023    K 4.5 05/03/2023    CL 100 05/03/2023    BICARB 30 (H) 05/03/2023    BUN 35 (H) 05/03/2023    CREAT 1.48 (H) 05/03/2023    GLU 110 (H) 05/03/2023    [place] 9.7 05/03/2023     Lab Results   Component Value Date    AST 55 (H) 05/03/2023    ALT 48 (H) 05/03/2023    ALK 78 05/03/2023    TP 6.8 05/03/2023    ALB 3.7 05/03/2023    TBILI 0.37  05/03/2023     Lab Results   Component Value Date    PHOS 4.1 04/27/2023    MG 1.8 04/27/2023         Medications/supplements:  Current Outpatient Medications   Medication Sig    acetaminophen (TYLENOL) 500 MG tablet Take 2 tablets (1,000 mg) by  mouth every 8 hours as needed for Mild Pain (Pain Score 1-3) or Moderate Pain (Pain Score 4-6).    albuterol (PROAIR HFA) 108 (90 Base) MCG/ACT inhaler ProAir HFA 90 mcg/actuation aerosol inhaler    allopurinol (ZYLOPRIM) 100 MG tablet Take 1 tablet (100 mg) by mouth daily.    amitriptyline (ELAVIL) 50 MG tablet     Artificial Tear Solution (SOOTHE XP OP)     Ascorbic Acid (VITAMIN C) 500 MG CAPS --    chlorhexidine (PERIDEX) 0.12 % solution     Cholecalciferol (VITAMIN D3) 50 MCG (2000 UT) capsule --    cyclobenzaprine (FLEXERIL) 5 MG tablet Flexeril 5 mg tablet   Take 1 tablet 3 times a day by oral route as needed.    diazePAM (VALIUM) 10 MG tablet     diclofenac (VOLTAREN) 1 % gel Apply 2 g topically every 6 hours as needed (Right knee pain).    DULoxetine (CYMBALTA) 20 MG CR capsule Take 1 capsule (20 mg) by mouth daily.    fluticasone propionate (FLONASE) 50 MCG/ACT nasal spray 2 sprays.    ibuprofen (MOTRIN) 600 MG tablet     ketotifen (ALAWAY) 0.025 % ophthalmic solution Place 1 drop into both eyes 2 times daily.    lidocaine (ASPERCREME) 4 % patch Apply 1 patch topically every 24 hours. Leave patch on for 12 hours, then remove for 12 hours.    loratadine (CLARITIN REDITABS) 10 MG dissolvable tablet     LORazepam (ATIVAN) 0.5 MG tablet     Multiple Vitamins-Minerals (MULTIVITAMIN WITH MINERALS) TABS tablet Take 1 tablet by mouth daily.    naloxone (NARCAN) 4 mg/0.1 mL nasal spray For suspected opioid overdose, call 911! Then spray once in one nostril. Repeat after 3 minutes if no or minimal response using a new spray in other nostril.    Nutritional Supplements (NEPRO) LIQD Take 3 Cans by mouth daily.    nystatin (MYCOSTATIN) 100000 UNIT/GM powder Apply 1 Application.  topically 3 times daily. Apply to affected area    oxyCODONE (ROXICODONE) 10 MG tablet Take 1.5 tablets to 2 tablets (15mg  to 20mg ) by mouth every 4 hours as needed for pain    oxyCODONE-acetaminophen (PERCOCET) 5-325 MG tablet     polyethylene glycol (MIRALAX) 17 g packet Take 1 packet (17 g) by mouth every 12 hours.    pregabalin (LYRICA) 100 MG capsule Take 1 capsule (100 mg) by mouth at bedtime.    prochlorperazine (COMPAZINE) 10 MG tablet Take 1 tablet (10 mg) by mouth every 6 hours as needed (Nausea/Vomiting).    PROTONIX 40 MG tablet     senna (SENOKOT) 8.6 MG tablet Take 2 tablets (17.2 mg) by mouth 2 times daily.    senna-docusate (PERICOLACE) 8.6-50 MG tablet Take 2 tablets by mouth.    traZODone (DESYREL) 50 MG tablet Take 1 tablets (50mg ) by mouth at bedtime as needed for insomnia    Urea-Hyaluronate Sodium (UMECTA PD) 40-0.3 % SUSP Umecta PD 40 % topical emulsion     No current facility-administered medications for this visit.          Past Medical / Surgical History:  Past Medical History:   Diagnosis Date    Chronic back pain     Congenital hydronephrosis     Gout     Headache     Hematuria     HTN (hypertension) 12/10/2021    Kidney disease     Kidney stones     Major depressive disorder, single episode  Polyarthropathy or polyarthritis of multiple sites     Retinal detachment     Urethral stricture      Past Surgical History:   Procedure Laterality Date    CT INSERTION OF SUPRAPUBIC CATH  09/25/2015    NEPHRECTOMY Right 1955    APPENDECTOMY      COLONOSCOPY      CYSTOSCOPY      CYSTOSCOPY W/ LASER LITHOTRIPSY      OTHER SURGICAL HISTORY      Interstim 01/29/2011    SPINE SURGERY  09/21    Lumbar-sacral fusion    TRANSURETHRAL RESECTION OF PROSTATE           Readiness to change: Ready  Expected Compliance: Good                             Barriers to Learning/Compliance: None  Pt. Verbalized understanding. Patient confirmed all questions and concerns were addressed to satisfaction, email provided  for interim questions.  Monitoring and Evaluation:  - Monitor po intake/diet tolerance  - Monitor BM pattern  - Monitor weight changes.        Follow up next available on June 13th at 1:30PM in clinic       Riley Lam, RD

## 2023-05-04 NOTE — Telephone Encounter (Signed)
[name] Service Progress Note    Received note from Oncology team that patient is requesting medication for anxiety.  Patient has history of possible overdose from his opioids, although unclear if sepsis contributed to that episode (did require Narcan at the time).  Occurred in context of a low dose methadone prescription. Have placed stat Psychiatry referral.  Will send hydroxyzine 25mg  PO q8h PRN anxiety to [facility] pharmacy.    Per CURES: oxycodone IR 10mg  #90 on 04/18/23, oxycodone IR 10mg  #90 on 03/31/23, pregabalin 100mg  #30 on 03/31/23.    Shea Waneta Fitting, MD  Attending Physician  [facility] Palliative [facility] Service

## 2023-05-04 NOTE — Interdisciplinary (Signed)
Non-Chemotherapy Infusion Nursing Note - [facility]    Stephen [name] is a 72 year old male who presents for infusion of hydration     Vitals:    05/04/23 1604   BP: 109/79   BP Location: Left arm   BP Patient Position: Sitting   Pulse: 60   Resp: 16   Temp: 98.1 F (36.7 C)   TempSrc: Temporal   SpO2: 99%   Weight: 71.5 kg (157 lb 10.1 oz)   Height: 5\' 6"  (1.676 m)     Pain Score: 7  Body surface area is 1.82 meters squared.  Body mass index is 25.44 kg/m.    Pre-treatment nursing assessment:  Unexpected findings: patient presents to infusion with huber needle in [facility], patient is not sure how long needle has been in place.   Huber needle removed, PAC reaccessed with aseptic technique.   Labs drawn.   Patient aware to pick up RX at [facility] pharmacy     [name] tolerated treatment well.  Hydration given.  Patient walked to pharmacy at [place] to pick up RX   Post blood return: Brisk  Post-Flush: NS, Heparin 100 units/ml, and Deaccessed Huber needle    Patient Education  Learner: Patient  Barriers to learning: No Barriers  Readiness to learn: Acceptance  Method: Explanation    Treatment Education: Information/teaching given to patient including: signs and symptoms of infection, bleeding, adverse reaction(s), symptom control, and when to notify MD.    Marletta Lor Prevention Education: Instructed patient to call for assistance.    Pain Education: Patient instructed to contact nurse if pain should develop or if their current pain therapy becomes ineffective.    Response: Verbalizes understanding    Discharge Plan  Discharge instructions given to patient.  Future appointments given and reviewed with treatment plan.  Discharge Mode: Ambulatory  Accompanied by: Self  Discharged To: Home

## 2023-05-05 ENCOUNTER — Encounter: Payer: Medicare Other | Admitting: Family Medicine

## 2023-05-05 ENCOUNTER — Encounter (HOSPITAL_BASED_OUTPATIENT_CLINIC_OR_DEPARTMENT_OTHER): Payer: Self-pay | Admitting: Nurse Practitioner

## 2023-05-06 NOTE — Addendum Note (Signed)
Encounter addended by: Lennie Muckle on: 05/06/2023 8:18 AM   Actions taken: Charge Capture section accepted

## 2023-05-08 ENCOUNTER — Ambulatory Visit
Admission: RE | Admit: 2023-05-08 | Discharge: 2023-05-08 | Disposition: A | Discharge status: Home / Self Care | Payer: Medicare Other | Source: Ambulatory Visit | Attending: Family Medicine | Admitting: Family Medicine

## 2023-05-08 DIAGNOSIS — Z122 Encounter for screening for malignant neoplasm of respiratory organs: Secondary | ICD-10-CM | POA: Diagnosis present

## 2023-05-08 DIAGNOSIS — Z87891 Personal history of nicotine dependence: Secondary | ICD-10-CM | POA: Diagnosis present

## 2023-05-09 ENCOUNTER — Ambulatory Visit: Payer: Medicare Other | Attending: Hematology & Oncology | Admitting: Speech-Language Pathologist

## 2023-05-09 VITALS — BP 119/63 | HR 50 | Temp 97.9°F | Resp 16 | Ht 66.0 in | Wt 155.0 lb

## 2023-05-09 DIAGNOSIS — R490 Dysphonia: Secondary | ICD-10-CM

## 2023-05-09 DIAGNOSIS — R1312 Dysphagia, oropharyngeal phase: Secondary | ICD-10-CM | POA: Insufficient documentation

## 2023-05-09 MED ORDER — LIDOCAINE ATOMIZER SYRINGE 4% SOLUTION (~~LOC~~)
Status: AC
Start: 2023-05-09 — End: 2023-05-09
  Filled 2023-05-09: qty 0.5

## 2023-05-09 MED ORDER — PHENYLEPHRINE ATOMIZER SYRINGE 0.25% SOLUTION
Status: AC
Start: 2023-05-09 — End: 2023-05-09
  Filled 2023-05-09: qty 0.5

## 2023-05-09 NOTE — Interdisciplinary (Signed)
[facility]     [facility]  Division of Head and Neck Surgery      SPEECH PATHOLOGY CONSULT    Chief Complaint: Dysphagia    Referring Provider: Cheri Guppy    History  Stephen [name] is a 72 year old male referred to speech pathology services at [facility].   Stage IV (cTx, cN0, pM1) ureter malignant neoplasm.  Has lost 40 pounds. Receded gums, cannot wear dentures  Cannot swallow pills. Feels as if there is a  click when he swallows.   Consumes protein supplements and soft/chopped/puree items.   Voice is hypophonic  +Xerostomia    Pain: +0/10  Current Weight: 155  Current Diet: Minced/moist and puree with thin liquids  Patient stated goal: To get better    Social  Family History   Adopted: Yes   Family history unknown: Yes     No family status information on file.          No data to display                Medications  Current Outpatient Medications on File Prior to Visit   Medication Sig Dispense Refill    acetaminophen (TYLENOL) 500 MG tablet Take 2 tablets (1,000 mg) by mouth every 8 hours as needed for Mild Pain (Pain Score 1-3) or Moderate Pain (Pain Score 4-6). 40 tablet 0    albuterol (PROAIR HFA) 108 (90 Base) MCG/ACT inhaler ProAir HFA 90 mcg/actuation aerosol inhaler      allopurinol (ZYLOPRIM) 100 MG tablet Take 1 tablet (100 mg) by mouth daily. 30 tablet 0    amitriptyline (ELAVIL) 50 MG tablet       Artificial Tear Solution (SOOTHE XP OP)       Ascorbic Acid (VITAMIN C) 500 MG CAPS --      chlorhexidine (PERIDEX) 0.12 % solution       Cholecalciferol (VITAMIN D3) 50 MCG (2000 UT) capsule --      cyclobenzaprine (FLEXERIL) 5 MG tablet Flexeril 5 mg tablet   Take 1 tablet 3 times a day by oral route as needed.      diazePAM (VALIUM) 10 MG tablet       diclofenac (VOLTAREN) 1 % gel Apply 2 g topically every 6 hours as needed (Right knee pain). 1 each 0    DULoxetine (CYMBALTA) 20 MG CR capsule Take 1 capsule (20 mg) by mouth daily. 90 capsule 3     fluticasone propionate (FLONASE) 50 MCG/ACT nasal spray 2 sprays.      [DISCONTINUED] gabapentin (NEURONTIN) 100 MG capsule Take 1 capsule (100 mg) by mouth 3 times daily for 7 days. Start taking after your procedure is completed. 21 capsule 0    hydrOXYzine HCL (ATARAX) 25 MG tablet Take 1 tablet (25 mg) by mouth every 8 hours as needed for Anxiety. 30 tablet 0    ibuprofen (MOTRIN) 600 MG tablet       ketotifen (ALAWAY) 0.025 % ophthalmic solution Place 1 drop into both eyes 2 times daily.      lidocaine (ASPERCREME) 4 % patch Apply 1 patch topically every 24 hours. Leave patch on for 12 hours, then remove for 12 hours. 30 patch 0    loratadine (CLARITIN REDITABS) 10 MG dissolvable tablet       LORazepam (ATIVAN) 0.5 MG tablet       Multiple Vitamins-Minerals (MULTIVITAMIN WITH MINERALS) TABS tablet Take 1 tablet by mouth daily. 30  tablet 1    naloxone (NARCAN) 4 mg/0.1 mL nasal spray For suspected opioid overdose, call 911! Then spray once in one nostril. Repeat after 3 minutes if no or minimal response using a new spray in other nostril. 2 each 0    Nutritional Supplements (NEPRO) LIQD Take 3 Cans by mouth daily. 30000 mL 11    nystatin (MYCOSTATIN) 100000 UNIT/GM powder Apply 1 Application. topically 3 times daily. Apply to affected area 15 g 0    oxyCODONE (ROXICODONE) 10 MG tablet Take 1.5 tablets to 2 tablets (15mg  to 20mg ) by mouth every 4 hours as needed for pain 90 tablet 0    oxyCODONE-acetaminophen (PERCOCET) 5-325 MG tablet       polyethylene glycol (MIRALAX) 17 g packet Take 1 packet (17 g) by mouth every 12 hours. 1 each 0    pregabalin (LYRICA) 100 MG capsule Take 1 capsule (100 mg) by mouth at bedtime. 30 capsule 2    prochlorperazine (COMPAZINE) 10 MG tablet Take 1 tablet (10 mg) by mouth every 6 hours as needed (Nausea/Vomiting). 30 tablet 5    PROTONIX 40 MG tablet       senna (SENOKOT) 8.6 MG tablet Take 2 tablets (17.2 mg) by mouth 2 times daily. 30 tablet 0    senna-docusate (PERICOLACE)  8.6-50 MG tablet Take 2 tablets by mouth.      traZODone (DESYREL) 50 MG tablet Take 1 tablets (50mg ) by mouth at bedtime as needed for insomnia 60 tablet 0    Urea-Hyaluronate Sodium (UMECTA PD) 40-0.3 % SUSP Umecta PD 40 % topical emulsion       No current facility-administered medications on file prior to visit.       Medical History  Past Medical History:   Diagnosis Date    Chronic back pain     Congenital hydronephrosis     Gout     Headache     Hematuria     HTN (hypertension) 12/10/2021    Kidney disease     Kidney stones     Major depressive disorder, single episode     Polyarthropathy or polyarthritis of multiple sites     Retinal detachment     Urethral stricture      Patient Active Problem List   Diagnosis    Post-traumatic stricture of anterior urethra    Bladder neck contracture    Lumbar radiculopathy    Cervical radiculopathy    Incomplete bladder emptying    Lumbosacral spondylosis without myelopathy    Chronic SI joint pain    Degenerative spondylolisthesis    Lumbar stenosis with neurogenic claudication    Pre-procedure lab exam    Cervical spondylosis without myelopathy    Cervical spondylosis    S/P lumbar fusion    Chronic left SI joint pain    Chronic midline thoracic back pain    Spondylolisthesis at L5-S1 level    Left renal mass    Malignant neoplasm of urinary bladder, unspecified site (CMS-HCC)    Dorsopathy    Ureter malignant neoplasm, left (CMS-HCC)    Cauda equina compression (CMS-HCC)    COVID-19 virus infection    Community acquired pneumonia    Acute urinary retention    HTN (hypertension)    Gout    MDD (major depressive disorder)    Urothelial cancer (CMS-HCC)    Urinary tract infection associated with indwelling urethral catheter (CMS-HCC)    Ureteral obstruction    Hydronephrosis    Urothelial carcinoma of kidney,  left (CMS-HCC)    Myalgia, other site    Trigger point with back pain    Pre-op evaluation    Malignant neoplasm of overlapping sites of bladder (CMS-HCC)    Cancer  associated pain    Rhabdomyolysis    Acute kidney injury superimposed on chronic kidney disease (CMS-HCC)    Hypotension    Altered mental status       Cancer History  Oncology History   Ureter malignant neoplasm, left (CMS-HCC)   10/26/2021 Initial Diagnosis    Ureter malignant neoplasm, left (CMS-HCC)     12/01/2022 Cancer Staged    Staging form: Renal Pelvis And Ureter, AJCC 8th Edition  - Clinical: Stage IV (cTX, cN0, pM1) - Signed by Cheri Guppy, MD on 12/01/2022          Patient Questionnaires  Eating Tool Assessment = 38  Voice Handicap Index = 11  PILL-5 = 16  MD Anderson Dysphagia Index (MDADI)  - Global Score = 2  - Composite Score = 44.30    Functional Oral Intake Scale (FOIS)    A. Tube Dependent (Levels 1-3)  No oral intake  Tube dependent with minimal/inconsistent oral intake  Tube supplements with consistent oral intake of food/liquid    B. Total Oral Intake (Levels 4-7)  4.   Total oral intake of a single consistency  5.   Total oral intake of multiple consistencies requiring special preparation  6.   Total oral intake with no special preparation, but must avoid specific foods or liquid items  7.   Total oral intake with no restrictions    PATIENT SCORE: 5    Performance Status Scale for Head and Neck Cancer Patients (PSS-HN)    Eating in Public: 25  100   No restriction of place, food, or companion (eats out at any opportunity)  75     No restriction of place, but restricts diet when in public (eats anywhere, but may limit intake to less "messy foods")  50     Eats only in presence of selected persons in selected places  25     Eats only at home in presence of selected persons  0       Always eats alone    Understandability in Speech: 100  100   Always understandable  75     Understandable most of the time; Occasional repetition necessary  50     Usually understandable; Face-to-face contact necessary  25     Difficult to understand  0       Never understandable; May use written  communication    Normalcy of Diet: 40  100   Full diet (no restrictions)  90     Full diet (liquid assist)  80     All meat  70     Raw carrots, celery  60     Dry bread and crackers  50     Soft chewable foods (e.g., macaroni, canned/soft fruits, cooked vegetables, fish, hamburger, small pieces of meat)  40     Soft foods requiring no chewing (e.g., mashed potatoes, apple sauce, pudding)  30     Pureed foods (in blender)  20     Warm liquids  10     Cold liquids  0       Nonoral feeding (tube fed)      Voice/Speech Evaluation (CPT 92524/92522)    Oral-Motor Examination:  Facial Muscles: Intact bilaterally  Dentition: Edentulous;Other (comments) (Cannot wear  dentures)  Buccal cavity: Normal  Mucosal Quality : Good  Lips: Normal  Tongue movement: Decreased protrusion;Decreased lingual strength  Jaw/lingual Differentiation: Reduced  Hard palate : Normal  Velar Movement with /a/: Reduced palatal elevation/weak  Gag Reflex: Unable to evaluate/assess  Vocal Quality : Asthenia;Fair  Lingual Strength (IOPI): 31Kpa, 25Kpa, 23Kpa    Daily hydration:        No data to display              Vocal demands: Limited  Voice quality and characteristics: Asthenic, hypophonic  Speech quality and characteristics: No dysfluency noted; Precise articulation  Intelligibility: 100% at the conversational level  Vocal resonance: No hyper- or hyponasality noted  DDKs: 90% for accuracy and rate    Perceptual Observations:  Pitch Inflection: Reduced   Speech Rate: WFL   Breath Support: WFL     GRBAS  Grade: 1   Roughness: 0   Breathiness: 1   Asthenia: 1   Strain: 0   Total: 3     Aerodynamic Measures:  /s/ = 9  /z/ = 12   Ratio:  0.75  Normal = 1.4   Maximum Phonation Time:  Predicted in seconds  Males     (Vital Capacity (mL) / 110) x 0.67  Females (Vital Capacity (mL) / 100) x 0.59 18 seconds            Acoustic Measures:  Speaking Pitch: B2  Pitch Range:  A2-D4  Average Speaking Volume: 64dB    Diagnostic Voice Therapy: (CPT [phone])     Education: Discussed nature and source of hypophonia including  asthenia with nutritional status as strong confounder   Exercise / Technique Modifications: Increasing vocal loudness, effort, optimizing nutrition   Response: Highly motivated       Clinical Swallow Evaluation: (CPT 92610)  Dysphagia Symptoms : Increased deglutition time;Hold up of food in throat;Pharyngeal symptoms;Xerostomia;Unintentional weight loss  Current diet: PO ground;PO pureed;Thin liquids  Oral Secretions: Xerostomia  Respirations: Normal  Able to maintain weight?: No  Significant Recent Weight Loss: Yes  How much?: 40  Cognitive Status: Normal  Self-Feeding Ability: Normal  Cough Strength: Strong  Respiratory Status: Normal  Speech Intelligibility: Normal  Food/liquid administered during evaluation: Pureed food by spoon;Thin liquid by cup  Lip seal: Within normal limits  Oral Prep : Intact  Mastication: Other (comments) (edentulous)  Swallow Initiation: Delayed A-P propulsion 1-5 seconds  Hyolaryngeal elevation: Reduced  Oral stasis: No  Swallow efficiency: Multiple swallows/bolus  Duration of deglutition: Increased  Problems: Increased time to eat;Decreased stamina/fatigue when eating;Sensation of pharyngeal stasis  Clinical Exam Recommendations: FEES;Initiate Swallow therapy  Compensatory techniques used: Chin Tuck  Aspiration Precautions : Slow rate of presentation;Small sips/bites;Alternate between foods and liquids;Tuck chin with every swallow  Diet recommendations: Ground diet PO;Pureed diet PO;Regular liquids PO    Flexible Endoscopic Evaluation of Swallowing (FEES): (CPT [id number])  A flexible endoscope was passed through the L nare. Decongestant and lidocane were applied. Velopharyngeal closure was wfl. Structures of the tongue base, epiglottis, arytenoids, pyriform sinuses and post-cricoid region were [facility]. Vocal fold appearance was intact with adequate movement. Secretions were fairly managed (collected in valleculae and pyriform  sinuses. Murray Secretion Scale (MSS) = 1. Base of tongue retraction was reduced during a back-vowel production. Pharyngeal squeeze was reduced during a high-pitched vocalization.     Murray Secretion Scale (MSS)   0 = Thin clear secretions  1 = Pooling outside laryngeal vestibule  2 = Pooling in laryngeal vestibule transiently,  spills in over observation period or patient clears them  3 = Pooling in laryngeal vestibule consistently and patient does not clear them    PO administered: Thin (water via ), puree (applesauce)   Oral containment/control of bolus: Patient able to form, and transfer   Swallow timing: Min delay with spill along laryngeal surface of epiglottis   Pharyngeal residue: Vallecular score = IV (Improved with chin tuck and effort)  Pyriform score = IV   Sensation / Response to residue: Diminished   Airway protection: WFL   Penetration: None    Aspiration: None    Penetration-Aspiration Scale: 1   Compensatory strategies: Chin tuck, double swallow, effortful swallow   Biofeedback:  Provided     Pharyngeal residue ratings based on Yale Pharyngeal Residue Severity Rating Scale (I - None, II - Trace, III - Mild, IV - Moderate, V - Severe)    PAS Scoring  1 - Does not enter airway  2 - Enters airway, remains above vocal folds, no residue  3 - Remains above vocal folds, visible residual remains  4 - Contacts vocal folds, no residue remains  5 - Contacts vocal folds, visible residue remains  6 - Passes glottis, no subglottic residue visible  7 - Passes glottis, visible subglottic residual despite sensory response  8 - Passes glottis, visible subglottic residual, absent sensory response        Diagnostic Swallow Therapy: (CPT 92526)    Education Reviewed FEES with patient. Provided patient education re: results of exam which reflect mild oropharyngeal dysphagia that appears to be associated with deconditioning, poor nutrition, weight loss  Clinical s/s of aspiration and dysphagia were discussed.    Instruction/Observations Provided safe swallowing strategies and compensatory strategies for PO intake including: effortful swallow, liquid assist/wash   Exercise(s) Provided demonstration and instruction re: all tasks       Evaluation Time: 30 minutes  Treatment Time: 30 minutes        Impressions  Stephen [name] presents with mild oropharyngeal dysphagia nd mild dysphonia associated with cancer diagnosis and associated weight loss/deconditioning. FEES identified reduced BOT retraction with associated retention in valleculae. Deficits may be confounded by poor dentition (edentulous) head positioning/arthritis. Recommend continue current diet with aggressive focus on increasing nutritional status with integration of swallow exercise regimen.        Recommendations  Diet Recommendation: Foods = Puree - 5, Drinks = Thins - 0  Compensatory Strategies:Liquid wash, effortful swallow  Return for therapy (92526/92507) q 2 weeks             Goals        Tolerate specified oral intake of foods and liquids with stable pulmonary function and absence of signs/symptoms of aspiration during skilled meal/oral gratification trial.      Tolerate 100% oral intake consisting of puree) foods and thin liquids with stable pulmonary function and absence of signs/symptoms of aspiration during skilled meal/oral gratification trial over 6 weeks       Verbalize and implement dysphagia compensatory strategies to ensure safe and efficient swallowing.             Stephen [name] was educated via written and verbal methods and was in agreement with the above-mentioned plan of care.    Thank you for your very kind referral.       Benita Gutter, MA, CCC-SLP, BCS-S

## 2023-05-10 ENCOUNTER — Ambulatory Visit (INDEPENDENT_AMBULATORY_CARE_PROVIDER_SITE_OTHER): Payer: Medicare Other | Admitting: Family Medicine

## 2023-05-10 ENCOUNTER — Encounter: Payer: Self-pay | Admitting: Family Medicine

## 2023-05-10 VITALS — BP 134/66 | HR 56 | Temp 97.2°F | Ht 67.75 in | Wt 221.1 lb

## 2023-05-10 DIAGNOSIS — Z0000 Encounter for general adult medical examination without abnormal findings: Secondary | ICD-10-CM | POA: Diagnosis not present

## 2023-05-10 DIAGNOSIS — N529 Male erectile dysfunction, unspecified: Secondary | ICD-10-CM

## 2023-05-10 DIAGNOSIS — M79642 Pain in left hand: Secondary | ICD-10-CM | POA: Insufficient documentation

## 2023-05-10 DIAGNOSIS — J432 Centrilobular emphysema: Secondary | ICD-10-CM

## 2023-05-10 DIAGNOSIS — R7303 Prediabetes: Secondary | ICD-10-CM

## 2023-05-10 DIAGNOSIS — E7800 Pure hypercholesterolemia, unspecified: Secondary | ICD-10-CM

## 2023-05-10 DIAGNOSIS — Z7189 Other specified counseling: Secondary | ICD-10-CM

## 2023-05-10 DIAGNOSIS — E669 Obesity, unspecified: Secondary | ICD-10-CM

## 2023-05-10 DIAGNOSIS — Z720 Tobacco use: Secondary | ICD-10-CM

## 2023-05-10 DIAGNOSIS — I700 Atherosclerosis of aorta: Secondary | ICD-10-CM

## 2023-05-10 DIAGNOSIS — Z8711 Personal history of peptic ulcer disease: Secondary | ICD-10-CM

## 2023-05-10 DIAGNOSIS — I2510 Atherosclerotic heart disease of native coronary artery without angina pectoris: Secondary | ICD-10-CM

## 2023-05-10 DIAGNOSIS — K760 Fatty (change of) liver, not elsewhere classified: Secondary | ICD-10-CM

## 2023-05-10 DIAGNOSIS — I359 Nonrheumatic aortic valve disorder, unspecified: Secondary | ICD-10-CM

## 2023-05-10 DIAGNOSIS — I7143 Infrarenal abdominal aortic aneurysm, without rupture: Secondary | ICD-10-CM

## 2023-05-10 DIAGNOSIS — Z87891 Personal history of nicotine dependence: Secondary | ICD-10-CM

## 2023-05-10 NOTE — Progress Notes (Signed)
Ph: [phone] Fax: [phone]   Patient ID: Johnny Holland, male    DOB: 04/26/1951, 72 y.o.   MRN: [id number]  This visit was conducted in person.  BP 134/66   Pulse (!) 56   Temp (!) 97.2 F (36.2 C) (Temporal)   Ht 5' 7.75" (1.721 m)   Wt 221 lb 2 oz (100.3 kg)   SpO2 96%   BMI 33.87 kg/m    CC: AMW/CPE Subjective:   HPI: Johnny Holland is a 72 y.o. male presenting on 05/10/2023 for Medicare Wellness   Did not see health advisor this year.   Hearing Screening - Comments:: Wears B lateral hearing aids. Wearing at today's OV.  Vision Screening - Comments:: Last eye exam, 01/2023.  Flowsheet Row Office Visit from 04/29/2022 in [facility]  PHQ-2 Total Score 0          04/29/2022   10:31 AM 03/06/2020    3:23 PM  Fall Risk   Falls in the past year? 1 0  Number falls in past yr: 0   Injury with Fall? 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 Planned rpt echocardiogram 05/2023.   Notes worsening bilateral hand stiffness as well as triggering of digits of both hands. Wants to avoid aleve. No redness, swelling, warmth   Preventative: COLONOSCOPY WITH PROPOFOL 04/13/2020 - TA x2, divrticulosis, rpt 5 yrs (Vanga, Loel Dubonnet, MD) Prostate cancer screening - yearly, no significant prostate symptoms Lung cancer screening - continue yearly CTs  Fluvax - declines  COVID - declines  Td - 2007, Tdap 2018  Prevnar-13 02/2019, pneumovax 04/2022 Zostavax - 2014  Shingrix - 08/2017, 12/2017  Advanced directives- brought and scanned 02/2021. Wife Diane and son Venson Stumbo are HCPOA. Does not want prolonged life support if terminal condition. Does want both artificial hydration and nutrition however. HCPOA can override advance directive.  Seat belt use discussed  Sunscreen use discussed. No suspicious moles. Sees derm regularly. 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  Ex smoker - quit smoking 2011 prior 1-2 ppd (42+  PY history). Continues vaping <1 cartridge/day.  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 Interim medical history since our last visit reviewed. Allergies and medications reviewed and updated. Outpatient Medications Prior to Visit  Medication Sig Dispense Refill   albuterol (PROVENTIL HFA;VENTOLIN HFA) 108 (90 Base) MCG/ACT inhaler Inhale 2 puffs into the lungs every 6 (six) hours as needed for wheezing or shortness of breath. 1 Inhaler 0   aspirin 81 MG tablet Take 1 tablet (81 mg total) by mouth daily.     atorvastatin (LIPITOR) 40 MG tablet Take 1 tablet (40 mg total) by mouth daily. TAKE 1/2 TABLET BY MOUTH EVERY DAY (Patient taking differently: Take 40 mg by mouth daily.) 90 tablet 3   Multiple Vitamin (MULTI-VITAMIN DAILY PO) Take by mouth.     sildenafil (REVATIO) 20 MG tablet Take 2-5 tablets (40-100  mg total) by mouth daily as needed (relations). 30 tablet 5   No facility-administered medications prior to visit.     Per HPI unless specifically indicated in ROS section below Review of Systems  Constitutional:  Negative for activity change, appetite change, chills, fatigue, fever and unexpected weight change.  HENT:  Negative for hearing loss.   Eyes:  Negative for visual disturbance.  Respiratory:  Negative for cough, chest tightness, shortness of breath and wheezing.   Cardiovascular:  Positive for leg swelling. Negative for chest pain and  palpitations.  Gastrointestinal:  Negative for abdominal distention, abdominal pain, blood in stool, constipation, diarrhea, nausea and vomiting.  Genitourinary:  Negative for difficulty urinating and hematuria.  Musculoskeletal:  Negative for arthralgias, myalgias and neck pain.  Skin:  Negative for rash.  Neurological:  Negative for dizziness, seizures, syncope and headaches.  Hematological:  Negative for adenopathy. Does not bruise/bleed easily.  Psychiatric/Behavioral:  Negative for dysphoric mood. The patient is not nervous/anxious.     Objective:  BP 134/66   Pulse (!) 56   Temp (!) 97.2 F (36.2 C) (Temporal)   Ht 5' 7.75" (1.721 m)   Wt 221 lb 2 oz (100.3 kg)   SpO2 96%   BMI 33.87 kg/m   Wt Readings from Last 3 Encounters:  05/10/23 221 lb 2 oz (100.3 kg)  02/16/23 224 lb 8 oz (101.8 kg)  05/04/22 220 lb (99.8 kg)      Physical Exam Vitals and nursing note reviewed.  Constitutional:      General: He is not in acute distress.    Appearance: Normal appearance. He is well-developed. He is not ill-appearing.  HENT:     Head: Normocephalic and atraumatic.     Right Ear: Hearing, tympanic membrane, ear canal and external ear normal.     Left Ear: Hearing, tympanic membrane, ear canal and external ear normal.     Nose: Nose normal.     Mouth/Throat:     Mouth: Mucous membranes are moist.     Pharynx: Oropharynx is clear. No oropharyngeal exudate or posterior oropharyngeal erythema.  Eyes:     General: No scleral icterus.    Extraocular Movements: Extraocular movements intact.     Conjunctiva/sclera: Conjunctivae normal.     Pupils: Pupils are equal, round, and reactive to light.  Neck:     Thyroid: No thyroid mass or thyromegaly.     Vascular: Carotid bruit: referred from cardiac murmur.  Cardiovascular:     Rate and Rhythm: Normal rate and regular rhythm.     Pulses: Normal pulses.          Radial pulses are 2+ on the right side and 2+ on the left side.     Heart  sounds: Murmur (3/6 systolic USB) heard.  Pulmonary:     Effort: Pulmonary effort is normal. No respiratory distress.     Breath sounds: Normal breath sounds. No wheezing, rhonchi or rales.  Abdominal:     General: Bowel sounds are normal. There is no distension.     Palpations: Abdomen is soft. There is no mass.     Tenderness: There is no abdominal tenderness. There is no guarding or rebound.     Hernia: No hernia is present.  Musculoskeletal:        General: Normal range of motion.     Cervical back: Normal range of motion and neck supple.     Right lower leg: No edema.     Left lower leg:  No edema.  Lymphadenopathy:     Cervical: No cervical adenopathy.  Skin:    General: Skin is warm and dry.     Findings: No rash.  Neurological:     General: No focal deficit present.     Mental Status: He is alert and oriented to person, place, and time.     Comments:  Recall 3/3 Calculation 5/5 DLROW  Psychiatric:        Mood and Affect: Mood normal.        Behavior: Behavior normal.        Thought Content: Thought content normal.        Judgment: Judgment normal.       Results for orders placed or performed in visit on 05/01/23  PSA  Result Value Ref Range   PSA 3.44 0.10 - 4.00 ng/mL  Hemoglobin A1c  Result Value Ref Range   Hgb A1c MFr Bld 5.8 4.6 - 6.5 %  Comprehensive metabolic panel  Result Value Ref Range   Sodium 139 135 - 145 mEq/L   Potassium 4.8 3.5 - 5.1 mEq/L   Chloride 103 96 - 112 mEq/L   CO2 29 19 - 32 mEq/L   Glucose, Bld 92 70 - 99 mg/dL   BUN 14 6 - 23 mg/dL   Creatinine, Ser 0.45 0.40 - 1.50 mg/dL   Total Bilirubin 0.7 0.2 - 1.2 mg/dL   Alkaline Phosphatase 85 39 - 117 U/L   AST 18 0 - 37 U/L   ALT 16 0 - 53 U/L   Total Protein 6.9 6.0 - 8.3 g/dL   Albumin 3.9 3.5 - 5.2 g/dL   GFR 40.98 >11.91 mL/min   Calcium 9.0 8.4 - 10.5 mg/dL  Lipid panel  Result Value Ref Range   Cholesterol 145 0 - 200 mg/dL   Triglycerides 47.8 0.0 - 149.0 mg/dL   HDL  29.56 >21.30 mg/dL   VLDL 86.5 0.0 - 78.4 mg/dL   LDL Cholesterol 76 0 - 99 mg/dL   Total CHOL/HDL Ratio 3    NonHDL 91.50     Assessment & Plan:   Problem List Items Addressed This Visit     Personal history of tobacco use, presenting hazards to health    Continue lung cancer screening program.  Continues vaping - encouraged full cessation, precontemplative.       PEPTIC ULCER DISEASE, HX OF    Avoid NSAIDs       HLD (hyperlipidemia)    Chronic, stable on atorvastatin.  The 10-year ASCVD risk score (Arnett DK, et al., 2019) is: 14.9%   Values used to calculate the score:     Age: 52 years     Sex: Male     Is Non-Hispanic African American: No     Diabetic: No     Tobacco smoker: No     Systolic Blood Pressure: 134 mmHg     Is BP treated: No     HDL Cholesterol: 53.3 mg/dL     Total Cholesterol: 145 mg/dL       Aortic valve disease    Followed by cardiology, appreciate their care      Obesity, Class I, BMI 30-34.9    Encourage healthy diet and lifestyle choices to affect sustainable weight loss.       Prediabetes    Encouraged limiting added sugar in diet.       Erectile dysfunction   Thoracic aorta atherosclerosis (HCC)    Continue aspirin, statin.  CAD (coronary artery disease)    Followed by cardiology, appreciate their care      COPD (chronic obstructive pulmonary disease) (HCC)    Noted on imaging. Does note exertional dyspnea but declines further evaluation.       Hepatic steatosis   Abdominal aortic aneurysm (AAA) without rupture (HCC)    Followed by cardiology, appreciate their care      Vapes nicotine containing substance    Encouraged full cessation.       Medicare annual wellness visit, subsequent - Primary (Chronic)    I have personally reviewed the Medicare Annual Wellness questionnaire and have noted 1. The patient's medical and social history 2. Their use of alcohol, tobacco or illicit drugs 3. Their current medications and  supplements 4. The patient's functional ability including ADL's, fall risks, home safety risks and hearing or visual impairment. Cognitive function has been assessed and addressed as indicated.  5. Diet and physical activity 6. Evidence for depression or mood disorders The patients weight, height, BMI have been recorded in the chart. I have made referrals, counseling and provided education to the patient based on review of the above and I have provided the pt with a written personalized care plan for preventive services. Provider list updated.. See scanned questionairre as needed for further documentation. Reviewed preventative protocols and updated unless pt declined.       Advanced directives, counseling/discussion (Chronic)    Previously discussed      Health maintenance examination (Chronic)    Preventative protocols reviewed and updated unless pt declined. Discussed healthy diet and lifestyle.       Bilateral hand pain    Hand pain/stiffness in h/o OA - anticipate wear and tear arthritis. Also has evidence of trigger finger.  Avoid NSAIDs in h/o peptic ulcer disease.  Provided with possible supplements to try including turmeric, vit D, glucosamine.  Offered sports med eval for trigger finger and/or hand surgery referral.         No orders of the defined types were placed in this encounter.   No orders of the defined types were placed in this encounter.   Patient Instructions  Good to see you today  Return as needed or in 1 year for next physical. For hand pain/stiffness - may try turmeric 500mg  twice daily, vitamin D 2000 units daily, glucosamine like in osteo biflex. Let us know if you'd like hand surgery referral, or schedule appointment to see sports medicine Dr Patsy Lager.   Follow up plan: Return in about 1 year (around 05/09/2024) for annual exam, prior fasting for blood work, medicare wellness visit.  Eustaquio Boyden, MD

## 2023-05-10 NOTE — Assessment & Plan Note (Addendum)
Noted on imaging. Does note exertional dyspnea but declines further evaluation.

## 2023-05-10 NOTE — Assessment & Plan Note (Addendum)

## 2023-05-10 NOTE — Assessment & Plan Note (Signed)
Encourage healthy diet and lifestyle choices to affect sustainable weight loss.  

## 2023-05-10 NOTE — Assessment & Plan Note (Signed)
Continue aspirin, statin.  

## 2023-05-10 NOTE — Assessment & Plan Note (Signed)
Continue lung cancer screening program.  Continues vaping - encouraged full cessation, precontemplative.

## 2023-05-10 NOTE — Assessment & Plan Note (Signed)
Avoid NSAIDs 

## 2023-05-10 NOTE — Assessment & Plan Note (Signed)
Followed by cardiology, appreciate their care. 

## 2023-05-10 NOTE — Assessment & Plan Note (Signed)
Encouraged full cessation.  ?

## 2023-05-10 NOTE — Patient Instructions (Addendum)
Good to see you today  Return as needed or in 1 year for next physical. For hand pain/stiffness - may try turmeric 500mg  twice daily, vitamin D 2000 units daily, glucosamine like in osteo biflex. Let us know if you'd like hand surgery referral, or schedule appointment to see sports medicine Dr Patsy Lager.

## 2023-05-10 NOTE — Assessment & Plan Note (Addendum)
Previously discussed.

## 2023-05-10 NOTE — Assessment & Plan Note (Signed)
Hand pain/stiffness in h/o OA - anticipate wear and tear arthritis. Also has evidence of trigger finger.  Avoid NSAIDs in h/o peptic ulcer disease.  Provided with possible supplements to try including turmeric, vit D, glucosamine.  Offered sports med eval for trigger finger and/or hand surgery referral.

## 2023-05-10 NOTE — Assessment & Plan Note (Signed)
Preventative protocols reviewed and updated unless pt declined. Discussed healthy diet and lifestyle.  

## 2023-05-10 NOTE — Assessment & Plan Note (Signed)
Chronic, stable on atorvastatin.  The 10-year ASCVD risk score (Arnett DK, et al., 2019) is: 14.9%   Values used to calculate the score:     Age: 72 years     Sex: Male     Is Non-Hispanic African American: No     Diabetic: No     Tobacco smoker: No     Systolic Blood Pressure: 134 mmHg     Is BP treated: No     HDL Cholesterol: 53.3 mg/dL     Total Cholesterol: 145 mg/dL

## 2023-05-10 NOTE — Assessment & Plan Note (Signed)
Encouraged limiting added sugar in diet.  

## 2023-05-11 ENCOUNTER — Other Ambulatory Visit: Payer: Self-pay | Admitting: Acute Care

## 2023-05-11 ENCOUNTER — Encounter (HOSPITAL_BASED_OUTPATIENT_CLINIC_OR_DEPARTMENT_OTHER): Payer: Self-pay | Admitting: Urology

## 2023-05-11 ENCOUNTER — Ambulatory Visit (HOSPITAL_BASED_OUTPATIENT_CLINIC_OR_DEPARTMENT_OTHER): Payer: Medicare Other | Admitting: Hematology & Oncology

## 2023-05-11 ENCOUNTER — Ambulatory Visit (HOSPITAL_BASED_OUTPATIENT_CLINIC_OR_DEPARTMENT_OTHER): Admit: 2023-05-11 | Discharge: 2023-05-11 | Payer: Medicare Other

## 2023-05-11 ENCOUNTER — Encounter (HOSPITAL_BASED_OUTPATIENT_CLINIC_OR_DEPARTMENT_OTHER): Payer: Self-pay | Admitting: Hematology & Oncology

## 2023-05-11 ENCOUNTER — Ambulatory Visit
Admission: RE | Admit: 2023-05-11 | Discharge: 2023-05-11 | Disposition: A | Discharge status: Home / Self Care | Payer: Medicare Other | Attending: Hematology & Oncology | Admitting: Hematology & Oncology

## 2023-05-11 VITALS — BP 113/62 | HR 56 | Temp 97.7°F | Resp 16 | Ht 66.0 in | Wt 157.2 lb

## 2023-05-11 VITALS — Wt 158.1 lb

## 2023-05-11 VITALS — BP 124/75 | HR 65 | Temp 97.6°F | Resp 16

## 2023-05-11 DIAGNOSIS — Z122 Encounter for screening for malignant neoplasm of respiratory organs: Secondary | ICD-10-CM

## 2023-05-11 DIAGNOSIS — Z87891 Personal history of nicotine dependence: Secondary | ICD-10-CM

## 2023-05-11 DIAGNOSIS — Z95828 Presence of other vascular implants and grafts: Secondary | ICD-10-CM | POA: Insufficient documentation

## 2023-05-11 DIAGNOSIS — Z5189 Encounter for other specified aftercare: Secondary | ICD-10-CM

## 2023-05-11 DIAGNOSIS — C7910 Secondary malignant neoplasm of unspecified urinary organs: Secondary | ICD-10-CM

## 2023-05-11 DIAGNOSIS — C662 Malignant neoplasm of left ureter: Secondary | ICD-10-CM | POA: Insufficient documentation

## 2023-05-11 DIAGNOSIS — Z5112 Encounter for antineoplastic immunotherapy: Secondary | ICD-10-CM | POA: Insufficient documentation

## 2023-05-11 LAB — COMPREHENSIVE METABOLIC PANEL, BLOOD
ALT (SGPT): 16 U/L (ref 0–41)
AST (SGOT): 21 U/L (ref 0–40)
Albumin: 3.9 g/dL (ref 3.5–5.2)
Alkaline Phos: 84 U/L (ref 40–129)
Anion Gap: 8 mmol/L (ref 7–15)
BUN: 34 mg/dL — ABNORMAL HIGH (ref 8–23)
Bicarbonate: 31 mmol/L — ABNORMAL HIGH (ref 22–29)
Bilirubin, Tot: 0.43 mg/dL (ref <1.2)
Calcium: 9.5 mg/dL (ref 8.5–10.6)
Chloride: 98 mmol/L (ref 98–107)
Creatinine: 1.21 mg/dL — ABNORMAL HIGH (ref 0.67–1.17)
Glucose: 110 mg/dL — ABNORMAL HIGH (ref 70–99)
Potassium: 5 mmol/L (ref 3.5–5.1)
Sodium: 137 mmol/L (ref 136–145)
Total Protein: 6.6 g/dL (ref 6.0–8.0)
eGFR Based on CKD-EPI 2021 Equation: >60 mL/min/{1.73_m2}

## 2023-05-11 LAB — TSH, BLOOD: TSH: 2.95 u[IU]/mL (ref 0.27–4.20)

## 2023-05-11 LAB — CBC WITH DIFF, BLOOD
ANC-Automated: 5.5 10*3/uL (ref 1.6–7.0)
ANC-Instrument: 5.5 10*3/uL (ref 1.6–7.0)
Abs Basophils: 0 10*3/uL (ref <0.2)
Abs Eosinophils: 0.2 10*3/uL (ref 0.0–0.5)
Abs Lymphs: 1.9 10*3/uL (ref 0.8–3.1)
Abs Monos: 0.7 10*3/uL (ref 0.2–0.8)
Basophils: 0.5 %
Eosinophils: 2.4 %
Hct: 27.9 % — ABNORMAL LOW (ref 40.0–50.0)
Hgb: 9 gm/dL — ABNORMAL LOW (ref 13.7–17.5)
Imm Gran %: 0.2 % (ref <1)
Lymphocytes: 22.5 %
MCH: 28.7 pg (ref 26.0–32.0)
MCHC: 32.3 g/dL (ref 32.0–36.0)
MCV: 88.9 um3 (ref 79.0–95.0)
MPV: 9.5 fL (ref 9.4–12.4)
Monocytes: 8.3 %
Plt Count: 286 10*3/uL (ref 140–370)
RBC: 3.14 10*6/uL — ABNORMAL LOW (ref 4.60–6.10)
RDW: 18.6 % — ABNORMAL HIGH (ref 12.0–14.0)
Segs: 66.1 %
WBC: 8.2 10*3/uL (ref 4.0–10.0)

## 2023-05-11 LAB — FREE THYROXINE, BLOOD: Free T4: 1.13 ng/dL (ref 0.93–1.70)

## 2023-05-11 LAB — CORTISOL, BLOOD: Cortisol: 11.9 ug/dL

## 2023-05-11 MED ORDER — HEPARIN SODIUM LOCK FLUSH 100 UNIT/ML IJ SOLN CUSTOM
500.0000 [IU] | INTRAVENOUS | Status: DC | PRN
Start: 2023-05-11 — End: 2023-05-11
  Administered 2023-05-11: 500 [IU] via INTRAVENOUS

## 2023-05-11 MED ORDER — SODIUM CHLORIDE 0.9 % IV SOLN
50.0000 mg | Freq: Once | INTRAVENOUS | Status: AC
Start: 2023-05-11 — End: 2023-05-11
  Administered 2023-05-11: 50 mg via INTRAVENOUS
  Filled 2023-05-11: qty 30

## 2023-05-11 MED ORDER — ONDANSETRON HCL 4 MG/2ML IV SOLN
8.0000 mg | Freq: Once | INTRAMUSCULAR | Status: AC
Start: 2023-05-11 — End: 2023-05-11
  Administered 2023-05-11: 8 mg via INTRAVENOUS
  Filled 2023-05-11: qty 4

## 2023-05-11 MED ORDER — SODIUM CHLORIDE 0.9 % IV SOLN
200.0000 mg | Freq: Once | INTRAVENOUS | Status: AC
Start: 2023-05-11 — End: 2023-05-11
  Administered 2023-05-11: 200 mg via INTRAVENOUS
  Filled 2023-05-11: qty 8

## 2023-05-11 MED ORDER — SODIUM CHLORIDE 0.9 % IV SOLN
Freq: Once | INTRAVENOUS | Status: AC
Start: 2023-05-11 — End: 2023-05-11

## 2023-05-11 MED ORDER — GABAPENTIN 100 MG OR CAPS
100.00 mg | ORAL_CAPSULE | Freq: Three times a day (TID) | ORAL | Status: DC
Start: ? — End: 2023-05-11

## 2023-05-11 MED ORDER — SODIUM CHLORIDE 0.9 % IJ SOLN (CUSTOM)
20.0000 mL | INTRAMUSCULAR | Status: DC | PRN
Start: 2023-05-11 — End: 2023-05-11
  Administered 2023-05-11: 20 mL via INTRAVENOUS

## 2023-05-11 MED ORDER — LIDOCAINE HCL (PF) 1 % IJ SOLN
0.3000 mL | INTRAMUSCULAR | Status: DC | PRN
Start: 2023-05-11 — End: 2023-05-11

## 2023-05-11 NOTE — Patient Instructions (Addendum)
Patient Instructions:    -- Schedule your next follow up appointments  at the front desk on your way out, or by calling the Urology Scheduling line:  [phone].  1.)  Return visit with Franco Nones on 6/13  2.)  Return visit with Dr. Roseanne Reno on 7/11    --Bring all of your home medications to your next appointment with Franco Nones    --Treat today and return as scheduled      Have questions about Advanced Care Planning?  PREPARE ([email or web address])       Future Appointments   Date Time Provider Department Center   05/11/2023 10:00 AM MUC INFUSION/CTI MUC Onc Chem MUC   05/18/2023  8:45 AM FAST TRACK MUC FAST MUC   05/18/2023 10:00 AM MUC INFUSION/CTI MUC Onc Chem MUC   05/29/2023 11:00 AM Shea Evans, MD MUC Onc MUC   06/01/2023  1:30 PM Riley Lam, RD MUC Onc MUC   06/20/2023 11:00 AM Blumenfeld, Georgena Spurling, SP MUC Onc MUC           *[facility] Website:  [email or web address]    Contact Information:   Brock Ra, MD   Assistant Professor of Medicine  Medical Oncology  Genitourinary Malignancy      Franco Nones, M.S., PA-C  Sr. Physician Assistant    Clinical questions or concerns  Oncology Nurse Case Manager   Elsworth Soho RN, BSN  T: [phone]  Department of Urology   [facility]  [address]  [place], [place] [address]    Dr. Roseanne Reno Follow Up Appointment Assistance  Administrative Assistant   Terence Lux  T: [phone]  F: [phone]    Monday-Friday 8:00- 4:30p.m. (Closed Holidays and Weekends)  Please listen to message carefully for daily information.   Leave a detailed message with your name, medical record number and   telephone number. Messages are usually returned within 24 hours.     [facility] Phone List     AFTER HOURS EMERGENCY NUMBER   Ask for the Oncologist Physician On-Call.                   [phone]    Outpatient  [facility] Scheduling line: [phone]  Call for information about our new location and to schedule or cancel appointments.    [facility] Scheduling line: [phone]   Call to schedule your Stephen [name], CT Scans or MRI studies.    PET Scan Scheduling line: [phone]  Call to schedule your PET scan    Prostate MRI Scheduling line: [phone] or [phone]  Call to schedule your prostate MRI.   Performed at the [facility] ([facility]).    Nuclear Medicine Scheduling Line [phone]   Call to schedule your bone scan, Radium 223     [facility] Infusion Center: [phone]   Call to schedule, cancel, or reschedule chemotherapy appointments.  Infusion Center Hours--- Monday-Friday 7:00-7:30 p.m.; Sat-Sun 8:00-4:30pm.    [facility] ([address]): [phone]  Monday-Friday 730-530 p.m.; Closed Weekends    Radiation Oncology: [phone]    Call to schedule, cancel, or reschedule radiation appointments    Social Worker  [place]: Mathews Robinsons, [place]    T: [phone]  [facility]: Henri Medal, LCSW  T: [phone]    Financial Counselors   [phone] at [facility]   [phone] or [phone]  [facility]  [address], [place] [address]    -You can also reach Stephen [name] by MyChart. To set up your MyChart please see the following site: [email or web address]  -For cancer support services, please visit out Patient and [facility].  -For [facility] support services, please visit:  [email or web address]  -For other support from the American Cancer Society, please visit: [email or web address]/index              Thank you for allowing Stephen [name] to participate in your care today! We value your feedback as we strive to provide every patient with an exceptional care experience.     You may receive a patient satisfaction  survey in the mail in the next few weeks. Please fill out the survey upon receipt and return it in the self-addressed envelope. Your feedback will be used to help improve the experience for all our patients at [facility].     If you want to share a positive experience you had at our facility please call We Listen at [phone]. If you have any suggestions on how we can improve our services please call the [facility] leadership team at [phone].    For medical questions or emergencies, please call your physician's office or 911. Thank you!

## 2023-05-11 NOTE — Interdisciplinary (Signed)
Chemotherapy Nursing Note -  Infusion Center    Stephen [name] is a 72 year old male who presents for chemotherapy cycle 5, day(s) 1 of PADCEV + KEYTRUDA.    Current Chemotherapy Signed Informed Consent verified/obtained yes    Pre-treatment nursing assessment:  Patient arrived to infusion center today ambulatory via walker. Patient is A&Ox4, unaccompnied. Patient reports feeling okay today, but tired. Eating and drinking well at home. Denies nausea, vomiting, constipation or diarrhea. No complaints of fever, cough, rash, itching or SOB.     Port a cath was accessed in the fast track. Blood return present and flushing well.     Patient met treatment parameters.    Lab Results   Component Value Date    ABSNEUTRO 5.5 05/11/2023    IANC 5.5 05/11/2023    WBC 8.2 05/11/2023    RBC 3.14 (L) 05/11/2023    HGB 9.0 (L) 05/11/2023    HCT 27.9 (L) 05/11/2023    MCV 88.9 05/11/2023    MCHC 32.3 05/11/2023    RDW 18.6 (H) 05/11/2023    PLT 286 05/11/2023       Lab Results   Component Value Date    NA 137 05/11/2023    K 5.0 05/11/2023    CL 98 05/11/2023    BICARB 31 (H) 05/11/2023    BUN 34 (H) 05/11/2023    CREAT 1.21 (H) 05/11/2023    GLU 110 (H) 05/11/2023    [place] 9.5 05/11/2023           Lab Results   Component Value Date    AST 21 05/11/2023    ALT 16 05/11/2023    GGT 131 (H) 01/17/2022    LDH 172 01/19/2022    ALK 84 05/11/2023    TP 6.6 05/11/2023    ALB 3.9 05/11/2023    TBILI 0.43 05/11/2023    DBILI <0.2 01/19/2022        Vitals:    05/11/23 1215   BP: 124/75   Pulse: 65   Resp: 16   Temp: 97.6 F (36.4 C)   TempSrc: Temporal     Pain Score:    There is no height or weight on file to calculate BSA.  There is no height or weight on file to calculate BMI.    ECOG  ECOG: 2     PHQ2  PHQ2 Total: 0      Chemotherapy:  [name] tolerated treatment well.      Medications   enfortumab vedotin-ejfv (PADCEV) 50 mg in sodium chloride 0.9 % 100 mL chemo infusion (0 mg IntraVENOUS IV Stop 05/11/23 1124)   sodium  chloride 0.9% infusion (0 mL IntraVENOUS Stopped 05/11/23 1230)   ondansetron (ZOFRAN) injection 8 mg (8 mg IntraVENOUS Given 05/11/23 1024)   pembrolizumab (KEYTRUDA) 200 mg in sodium chloride 0.9 % 100 mL infusion (0 mg IntraVENOUS Completed 05/11/23 1224)     Thirty minute observation completed between infusions.     Post blood return: Brisk  IV access post infusion: NS + Heparin  Port de-accessed.       Patient Education  Learner: Patient  Barriers to learning: No Barriers  Readiness to learn: Acceptance  Method: Explanation    Treatment Education:       Signs and symptoms of infection, bleeding, adverse reaction(s), symptom control, and when to notify MD.    Patient instructed on post-treatment care and precautions specific to drug(s) administered.   If applicable, education given on  vesicant administration risk and signs and symptoms of extravasation (redness, pain, swelling, pressure at IV site) to report immediately to the chemotherapy RN.    Fall Prevention Education: Instructed patient to call for assistance, call light within reach.   Pain Education: Patient instructed to contact nurse if pain should develop or if their current pain therapy becomes ineffective.       Treatment education provided to patient: yes    Response: Verbalizes understanding    Discharge Plan  Discharge instructions given to patient.  Future appointments:date given and reviewed with treatment plan.  Discharge Mode: Ambulatory  Discharge Time: 1245    Accompanied by: Self  Discharged To: Home

## 2023-05-11 NOTE — Addendum Note (Signed)
Encounter addended by: Britt Bolognese on: 05/11/2023 10:27 AM   Actions taken: Flowsheet accepted

## 2023-05-11 NOTE — Interdisciplinary (Signed)
Nurses Note: Pt in for Port-A-Cath line care and lab draw.  Vital signs stable and pt. in no apparent distress.  Pt reports no nausea, vomiting, constipation, or diarrhea.  Site clean, dry, and intact.  Line accessed and blood return verified.  Dressing and posiflow cap changed.  Line flushed with NS and Heparin 100 units/ml.  Pt waiting for lab results.  Pt discharged to MD follow-up in no apparent distress.  [facility] time today for Enfortumab/Pembrolizumab.

## 2023-05-11 NOTE — Interdisciplinary (Signed)
Received: Today  Elsworth Soho, RN  P Muc Infusion Schedulers  Cancer Center Appointment Requests  Location: [facility]  MD Provider: Brock Ra  Type of Appointment: Infusion    Infusion Request  Location: [facility]  Treatment: Enfortumab + Keytruda  Does the patient have a PICC or a PORT? yes  Acuity Level: 2  Length of Treatment: 1-2 hrs  Date(s) Requested: 6/13 at 10 am (2 hrs), 6/20 (1 hr), 7/11 at 10 am (2 hrs), 7/18 (1hr)  Clinic / MD Appointment Same Day? Yes on 6/13 at 7/11    Pre-Chemo Labs: yes  Date(s) Requested: 6/13 at 8 am, 6/20, 7/11 at 8 am, 7/18    Comments: Please call the patient to schedule, thank you!

## 2023-05-11 NOTE — Progress Notes (Signed)
Name: Stephen [name]   Date of Birth: June 24, 1951  Medical Record Number: [id number]    [facility]     Patient ID: Metastatic urothelial carcinoma    Stage: Cancer Staging   Ureter malignant neoplasm, left (CMS-HCC)  Staging form: Renal Pelvis And Ureter, AJCC 8th Edition  - Clinical: Stage IV (cTX, cN0, pM1) - Signed by Cheri Guppy, MD on 12/01/2022    Oncologic History:    04/22/21  CTU: filling defect in L renal pelvis  05/05/21  Cysto with L ureteroscopy revealed a L papillary tumor, biopsied: mixed high (30%) and low grade urothelial carcinoma  06/16/21  Repeat cysto, papillary tumor: pathology low grade papillary [place]  09/xx/22  Completed induction with mitogel (6 cycles)  08/19/21  HGTa of the bladder, no muscle  08/26/21  Repeat TURBT: NED, muscle identified  10/26/21  MRU: mild thickening in the L renal pelvis  12/24/21  Cysto/L ureteroscopy: dysplasia in the bladder; no disease in L ureter  05/06/22  L PCN placed  06/01/22  TURBT and L ureteroscopy: NED, stricture in place, stent placed  09/29/22  TURBT and L ureteroscopy: NED  10/22/22  MRU: Mild diffuse thickening in the L ureter/renal pelvis. Multiple osseous lesions involving the bilateral iliac crest and right acetabulum concerning for osseous metastatic disease.   11/26/22  PET/CT: diffuse osseous FDG activity with discrete osseous lesions concerning for metastatic disease.  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  Also treated for septic shock, AKI (Cr 2.5) and acute liver injury (AST/ALT 2800/2100).   S/p exchange L PCN.   02/07/23  start XRT to R Shoulder  02/10/23  C2D1 EV (dose reduced to 1.0 mg/kg) + pembro 200 mg  IV  03/03/23  C3D1 EV/pembro, missed D8  03/06/23  Cysto: NED.  Cytology NED.   03/14/23  Seen at [facility] ED for dislodged tube, replaced  04/02-05/24  Admitted with weakness, poor PO intake.   04/06/23  C4D1 EV/pembro  04/13/23  C4D8 EV  04/26/23  PET/CT: osseous lesions much improved, many new sclerotic lesions c/w treatment change.  Nonspecific mediastinal and pelvic LN  05/09-11/24  Admitted for treatment of AKI and rhabdomyolysis    Interim History:  Here to consider next cycle of EV/P, cycle 5  Recently evaluated by SLP  He is eating better.   He'd like to restart radiation to her R shoulder  He's having issues with constipation.   L PCN draining clear yellow  Swelling has gone down    Review of Systems:   A complete ROS was performed and is negative except as documented above.     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  Non smoker  No etoh    Medications:  Current Outpatient Medications on File Prior to Visit  Medication Sig Dispense Refill    acetaminophen (TYLENOL) 500 MG tablet Take 2 tablets (1,000 mg) by mouth every 8 hours as needed for Mild Pain (Pain Score 1-3) or Moderate Pain (Pain Score 4-6). 40 tablet 0    albuterol (PROAIR HFA) 108 (90 Base) MCG/ACT inhaler ProAir HFA 90 mcg/actuation aerosol inhaler      allopurinol (ZYLOPRIM) 100 MG tablet Take 1 tablet (100 mg) by mouth daily. 30 tablet 0    [DISCONTINUED] amitriptyline (ELAVIL) 50 MG tablet       Artificial Tear Solution (SOOTHE XP OP)       [DISCONTINUED] Ascorbic Acid (VITAMIN C) 500 MG CAPS --      [DISCONTINUED] chlorhexidine (PERIDEX) 0.12 % solution       [DISCONTINUED] Cholecalciferol (VITAMIN D3) 50 MCG (2000 UT) capsule --      [DISCONTINUED]  cyclobenzaprine (FLEXERIL) 5 MG tablet Flexeril 5 mg tablet   Take 1 tablet 3 times a day by oral route as needed.      [DISCONTINUED] diazePAM (VALIUM) 10 MG tablet       diclofenac (VOLTAREN) 1 % gel Apply 2 g topically every 6 hours as needed (Right knee pain). 1 each 0    DULoxetine (CYMBALTA) 20 MG CR capsule Take 1 capsule (20 mg) by mouth daily. 90 capsule 3    fluticasone propionate (FLONASE) 50 MCG/ACT nasal spray 2 sprays.      [DISCONTINUED] gabapentin (NEURONTIN) 100 MG capsule Take 1 capsule (100 mg) by mouth 3 times daily.      hydrOXYzine HCL (ATARAX) 25 MG tablet Take 1 tablet (25 mg) by mouth every 8 hours as needed for Anxiety. 30 tablet 0    ibuprofen (MOTRIN) 600 MG tablet       ketotifen (ALAWAY) 0.025 % ophthalmic solution Place 1 drop into both eyes 2 times daily.      [DISCONTINUED] lidocaine (ASPERCREME) 4 % patch Apply 1 patch topically every 24 hours. Leave patch on for 12 hours, then remove for 12 hours. 30 patch 0    loratadine (CLARITIN REDITABS) 10 MG dissolvable tablet       [DISCONTINUED] LORazepam (ATIVAN) 0.5 MG tablet       Multiple Vitamins-Minerals (MULTIVITAMIN WITH MINERALS) TABS tablet Take 1 tablet by mouth daily. 30 tablet 1    naloxone (NARCAN) 4 mg/0.1 mL nasal spray For suspected opioid overdose, call 911! Then spray once in one nostril. Repeat after 3 minutes if no or minimal response using a new spray in other nostril. 2 each 0    Nutritional Supplements (NEPRO) LIQD Take 3 Cans by mouth daily. 30000 mL 11    [DISCONTINUED] nystatin (MYCOSTATIN) 100000 UNIT/GM powder Apply 1 Application. topically 3 times daily. Apply to affected area 15 g 0    oxyCODONE (ROXICODONE) 10 MG tablet Take 1.5 tablets to 2 tablets (15mg  to 20mg ) by mouth every 4 hours as needed for pain 90 tablet 0    [DISCONTINUED] oxyCODONE-acetaminophen (PERCOCET) 5-325 MG tablet       polyethylene glycol (MIRALAX) 17 g packet Take 1 packet (17 g) by mouth every 12 hours. 1 each 0    pregabalin (LYRICA)  100 MG capsule Take 1 capsule (100 mg) by mouth at bedtime. 30 capsule 2    prochlorperazine (COMPAZINE) 10 MG tablet Take 1 tablet (10 mg) by mouth every 6 hours as needed (Nausea/Vomiting). 30 tablet 5    [DISCONTINUED] PROTONIX 40 MG tablet       senna (SENOKOT) 8.6 MG tablet  Take 2 tablets (17.2 mg) by mouth 2 times daily. 30 tablet 0    [DISCONTINUED] senna-docusate (PERICOLACE) 8.6-50 MG tablet Take 2 tablets by mouth.      traZODone (DESYREL) 50 MG tablet Take 1 tablets (50mg ) by mouth at bedtime as needed for insomnia 60 tablet 0    [DISCONTINUED] Urea-Hyaluronate Sodium (UMECTA PD) 40-0.3 % SUSP Umecta PD 40 % topical emulsion       No current facility-administered medications on file prior to visit.       Physical Examination:  There were no vitals filed for this visit.  ECOG PS 1  General: Appears thin today. Some tangential conversation, this remains stable. Walks without rolling walker, appears stable  HEENT: Moist mucous membranes.   Neck: No thyromegaly or lymphadenopathy.   Chest: Clear to ausculation bilaterally. No wheezes, rales or rhonchi.    Cardiac: Regular rate and rhythm without any murmurs, rubs or gallops.   Abdomen: Abd soft, non-tender. +BS.   Extremities: No clubbing, cyanosis. No edema  Neurological: Awake, alert and oriented x 3. Strength and sensation are grossly intact.   Psychologic: Appropriate mood and affect.  GU: L PCN site clean and dry, draining clear, yellow urine.     Labs: Following labs reviewed  Lab Results   Component Value Date    WBC 8.2 05/11/2023    RBC 3.14 (L) 05/11/2023    HGB 9.0 (L) 05/11/2023    HCT 27.9 (L) 05/11/2023    MCV 88.9 05/11/2023    MCHC 32.3 05/11/2023    RDW 18.6 (H) 05/11/2023    PLT 286 05/11/2023    MPV 9.5 05/11/2023      Lab Results   Component Value Date    BUN 34 (H) 05/04/2023    CREAT 1.21 (H) 05/04/2023    CL 101 05/04/2023    NA 141 05/04/2023    K 4.2 05/04/2023    [place] 9.2 05/04/2023    TBILI 0.26 05/04/2023    ALB 3.5 05/04/2023    TP  6.3 05/04/2023    AST 38 05/04/2023    ALK 76 05/04/2023    BICARB 28 05/04/2023    ALT 37 05/04/2023    GLU 141 (H) 05/04/2023                Imaging:    PET CT 5/08:  1. Left percutaneous nephrostomy in place. There is interval increase in now moderate left hydronephrosis.  2. Multiple small abdominopelvic lymph nodes demonstrate metabolic activity at or slightly above blood pool and remain nonspecific. In particular, a periportal lymph node demonstrates slight interval increase in size and hypermetabolic activity. Continued attention on follow-up is recommended.  3. Interval development of numerous predominantly sclerotic osseous lesions throughout the axial and proximal appendicular skeleton. When compared with prior PET-CT 11/26/2022 many of the previous hypermetabolic lesions demonstrate interval decrease in metabolic activity and increased sclerosis, most compatible with positive response to therapy. Diffuse heterogeneous hypermetabolic activity remains throughout the axial and appendicular skeleton. This may be related to a background of reactive or treatment related process. Ongoing hypermetabolic metastasis is not excluded and continued attention on follow-up is recommended.  4. Interval development of multiple mildly hypermetabolic mediastinal and hilar lymph nodes. Findings are nonspecific and could represent a superimposed infectious or inflammatory process. However, new site of metastatic disease is not excluded and close attention on follow-up is recommended.  5. Persistent focal hypermetabolic activity associated with soft tissue prominence along the left lateral aspect of  the distal sigmoid colon. Findings are in the setting of large colonic stool burden and could represent stercoral colitis. Correlate with symptoms.      Assessment:  Stephen [name] is a 72 year old male with OA, HTN, MDD with L UTUC with metastatic disease ([id number]), with osseous mets (Pet/CT with diffuse enhancement and  discrete lesions). FGFR-TACC mutation.  Started on EV+Pembro.  Scans after 4 cycles with improvement of osseous lesions, but demonstrate substantial sclerotic osseous lesions, suggesting treated lesions not scene prior    I personally reviewed labs as above.  I personally reviewed Pet/Ct as above.    Scan with improvement in osseous lesions.  Overall, Stephen [name] has had a tough time with treatment and symptoms from his disease.  I will dose reduce the EV from 1.0 --> 0.75 mg/kg and continue pembrolizumab.        Advanced Urothelial Carcinoma, [id number]  - proceed with C5  - Treatment plan:  --- Enfortumab vedotin 1.25 --> 1.0 mg/kg --> 0.75 mg/kg  (dose reduced prior to C5 to 0.75) D1, D8 Q21d  --- Pembrolizumab 200 mg D1 Q21d  --- prn anti-nausea meds  --- prn antidiarrheal medications  --- prn topical skin care  --- ED precautions  - consider adding zometa given significnt bone disease; needs vit D level checked and dental clearance - he's known to have significant dental issues so this needs to be cleared first  - nutrition referral  - needs to bring in meds at next appt to do a full med rec  - RTC 3 weeks with PA-C Cherry  - RTC 6 weeks with me     Anxiety, worsening  - f/u with palliative care  - monitor closely    RLE edema  - improved    Cancer related pain  - follows with palliative care  - continue intermittent oxycodone.   - caution given multiple events of AMS    R shoulder pain  - 2/2 metastatic lesion  - seen by ortho 1/22  - needs fllow up with rad onc to get XRT restarted  - seen by palliative care team (Dr. Margarita Mail) 2/05     L PCN, initially placed 04/2022  - tube exchange planned 05/10/23  - draining well, will monitor    This visit was spent addressing a complex, chronic illness that poses a threat to life or bodily function to this patient.  I independently reviewed the patient's history since prior visit, reviewed intervening labs and imaging, counseled the patient on the management of their chronic  illness, and placed orders to be done prior to next visit.     Asa Saunas. Roseanne Reno, M.D.  Assistant Professor of Medicine   [employer] [place], [facility]  [facility]  [address]  [place], [place] [address]

## 2023-05-12 NOTE — Telephone Encounter (Signed)
Duplicate, see other MyChart dated 05/11/23.

## 2023-05-15 ENCOUNTER — Encounter (HOSPITAL_BASED_OUTPATIENT_CLINIC_OR_DEPARTMENT_OTHER): Payer: Self-pay | Admitting: Hematology & Oncology

## 2023-05-15 DIAGNOSIS — Z0000 Encounter for general adult medical examination without abnormal findings: Secondary | ICD-10-CM

## 2023-05-15 DIAGNOSIS — K4090 Unilateral inguinal hernia, without obstruction or gangrene, not specified as recurrent: Secondary | ICD-10-CM

## 2023-05-15 DIAGNOSIS — K439 Ventral hernia without obstruction or gangrene: Secondary | ICD-10-CM

## 2023-05-16 NOTE — Telephone Encounter (Signed)
From: [name]  To: Cheri Guppy  Sent: 05/15/2023 9:00 PM PDT  Subject: left hernia     I have a left groin hernia it has gotten bigger no pain like someone out there to see if they could take care of it my clinic is not connected to [place] my GP left the clinic referred me to a new one maybe I should one out there now I have no one right now who can help me with this it may need to be removed I have no way to get an appointment out there right now sorry to bother you just have no one to help me with this just can't get a break let me know what you can do doing well now don't need any more stress or setbacks will be out there Thursday/MR/#[phone] hoping to get radiation set up now this

## 2023-05-17 ENCOUNTER — Encounter (HOSPITAL_BASED_OUTPATIENT_CLINIC_OR_DEPARTMENT_OTHER): Payer: Self-pay | Admitting: Family Medicine

## 2023-05-17 ENCOUNTER — Other Ambulatory Visit (HOSPITAL_BASED_OUTPATIENT_CLINIC_OR_DEPARTMENT_OTHER): Payer: Self-pay | Admitting: Family Medicine

## 2023-05-17 DIAGNOSIS — F419 Anxiety disorder, unspecified: Secondary | ICD-10-CM

## 2023-05-18 ENCOUNTER — Telehealth: Payer: Self-pay

## 2023-05-18 ENCOUNTER — Telehealth (HOSPITAL_BASED_OUTPATIENT_CLINIC_OR_DEPARTMENT_OTHER): Payer: Self-pay

## 2023-05-18 ENCOUNTER — Encounter (HOSPITAL_BASED_OUTPATIENT_CLINIC_OR_DEPARTMENT_OTHER)
Admission: RE | Disposition: A | Discharge status: Home Health | Payer: Self-pay | Source: Ambulatory Visit | Attending: Surgery

## 2023-05-18 ENCOUNTER — Other Ambulatory Visit: Payer: Self-pay

## 2023-05-18 ENCOUNTER — Ambulatory Visit (HOSPITAL_BASED_OUTPATIENT_CLINIC_OR_DEPARTMENT_OTHER): Admit: 2023-05-18 | Discharge: 2023-05-18 | Payer: Medicare Other

## 2023-05-18 ENCOUNTER — Ambulatory Visit (HOSPITAL_BASED_OUTPATIENT_CLINIC_OR_DEPARTMENT_OTHER): Payer: Medicare Other

## 2023-05-18 ENCOUNTER — Other Ambulatory Visit (HOSPITAL_BASED_OUTPATIENT_CLINIC_OR_DEPARTMENT_OTHER): Payer: Self-pay | Admitting: Family Medicine

## 2023-05-18 ENCOUNTER — Encounter (HOSPITAL_BASED_OUTPATIENT_CLINIC_OR_DEPARTMENT_OTHER): Payer: Self-pay | Admitting: Surgery

## 2023-05-18 ENCOUNTER — Ambulatory Visit
Admission: RE | Admit: 2023-05-18 | Discharge: 2023-05-18 | Disposition: A | Discharge status: Home / Self Care | Payer: Medicare Other | Source: Ambulatory Visit | Attending: Surgery | Admitting: Surgery

## 2023-05-18 VITALS — BP 110/53 | HR 60 | Temp 97.6°F | Resp 16 | Ht 66.1 in | Wt 153.4 lb

## 2023-05-18 VITALS — BP 100/54 | HR 62 | Temp 98.3°F | Resp 17 | Ht 66.14 in | Wt 153.4 lb

## 2023-05-18 DIAGNOSIS — Z5112 Encounter for antineoplastic immunotherapy: Secondary | ICD-10-CM | POA: Insufficient documentation

## 2023-05-18 DIAGNOSIS — Z95828 Presence of other vascular implants and grafts: Secondary | ICD-10-CM | POA: Insufficient documentation

## 2023-05-18 DIAGNOSIS — N1330 Unspecified hydronephrosis: Secondary | ICD-10-CM

## 2023-05-18 DIAGNOSIS — F419 Anxiety disorder, unspecified: Secondary | ICD-10-CM

## 2023-05-18 DIAGNOSIS — N131 Hydronephrosis with ureteral stricture, not elsewhere classified: Secondary | ICD-10-CM

## 2023-05-18 DIAGNOSIS — Z5189 Encounter for other specified aftercare: Secondary | ICD-10-CM

## 2023-05-18 DIAGNOSIS — C679 Malignant neoplasm of bladder, unspecified: Secondary | ICD-10-CM | POA: Insufficient documentation

## 2023-05-18 DIAGNOSIS — C689 Malignant neoplasm of urinary organ, unspecified: Secondary | ICD-10-CM

## 2023-05-18 DIAGNOSIS — Z466 Encounter for fitting and adjustment of urinary device: Secondary | ICD-10-CM | POA: Insufficient documentation

## 2023-05-18 DIAGNOSIS — C662 Malignant neoplasm of left ureter: Secondary | ICD-10-CM | POA: Insufficient documentation

## 2023-05-18 DIAGNOSIS — N179 Acute kidney failure, unspecified: Secondary | ICD-10-CM

## 2023-05-18 LAB — COMPREHENSIVE METABOLIC PANEL, BLOOD
ALT (SGPT): 14 U/L (ref 0–41)
AST (SGOT): 21 U/L (ref 0–40)
Albumin: 4.1 g/dL (ref 3.5–5.2)
Alkaline Phos: 98 U/L (ref 40–129)
Anion Gap: 9 mmol/L (ref 7–15)
BUN: 32 mg/dL — ABNORMAL HIGH (ref 8–23)
Bicarbonate: 31 mmol/L — ABNORMAL HIGH (ref 22–29)
Bilirubin, Tot: 0.57 mg/dL (ref <1.2)
Calcium: 9.6 mg/dL (ref 8.5–10.6)
Chloride: 99 mmol/L (ref 98–107)
Creatinine: 1.16 mg/dL (ref 0.67–1.17)
Glucose: 121 mg/dL — ABNORMAL HIGH (ref 70–99)
Potassium: 4.6 mmol/L (ref 3.5–5.1)
Sodium: 139 mmol/L (ref 136–145)
Total Protein: 7 g/dL (ref 6.0–8.0)
eGFR Based on CKD-EPI 2021 Equation: >60 mL/min/{1.73_m2}

## 2023-05-18 LAB — CBC WITH DIFF, BLOOD
ANC-Automated: 3.6 10*3/uL (ref 1.6–7.0)
ANC-Instrument: 3.6 10*3/uL (ref 1.6–7.0)
Abs Basophils: 0 10*3/uL (ref <0.2)
Abs Eosinophils: 0.2 10*3/uL (ref 0.0–0.5)
Abs Lymphs: 1.3 10*3/uL (ref 0.8–3.1)
Abs Monos: 0.7 10*3/uL (ref 0.2–0.8)
Basophils: 0.5 %
Eosinophils: 3.5 %
Hct: 28.5 % — ABNORMAL LOW (ref 40.0–50.0)
Hgb: 9.4 gm/dL — ABNORMAL LOW (ref 13.7–17.5)
Imm Gran %: 0.3 % (ref <1)
Lymphocytes: 21.9 %
MCH: 29.1 pg (ref 26.0–32.0)
MCHC: 33 g/dL (ref 32.0–36.0)
MCV: 88.2 um3 (ref 79.0–95.0)
MPV: 9.7 fL (ref 9.4–12.4)
Monocytes: 11.5 %
Plt Count: 218 10*3/uL (ref 140–370)
RBC: 3.23 10*6/uL — ABNORMAL LOW (ref 4.60–6.10)
RDW: 18.5 % — ABNORMAL HIGH (ref 12.0–14.0)
Segs: 62.3 %
WBC: 5.7 10*3/uL (ref 4.0–10.0)

## 2023-05-18 SURGERY — IR CHNG NEPHROSTOMY TUBE
Anesthesia: Local | Laterality: Left

## 2023-05-18 MED ORDER — ONDANSETRON HCL 4 MG/2ML IV SOLN
8.0000 mg | Freq: Once | INTRAMUSCULAR | Status: AC
Start: 2023-05-18 — End: 2023-05-18
  Administered 2023-05-18: 8 mg via INTRAVENOUS
  Filled 2023-05-18: qty 4

## 2023-05-18 MED ORDER — SODIUM CHLORIDE 0.9 % IJ SOLN (CUSTOM)
10.0000 mL | INTRAMUSCULAR | Status: DC | PRN
Start: 2023-05-18 — End: 2023-05-18
  Administered 2023-05-18 (×2): 10 mL via INTRAVENOUS

## 2023-05-18 MED ORDER — SODIUM CHLORIDE 0.9 % IV SOLN
50.0000 mg | Freq: Once | INTRAVENOUS | Status: AC
Start: 2023-05-18 — End: 2023-05-18
  Administered 2023-05-18: 50 mg via INTRAVENOUS
  Filled 2023-05-18: qty 30

## 2023-05-18 MED ORDER — HYDROXYZINE HCL 25 MG OR TABS
25.0000 mg | ORAL_TABLET | Freq: Three times a day (TID) | ORAL | 0 refills | Status: DC | PRN
Start: 2023-05-18 — End: 2023-05-29
  Filled 2023-05-18: qty 30, 10d supply, fill #0

## 2023-05-18 MED ORDER — IODIXANOL 320 MG/ML IV SOLN
INTRAVENOUS | Status: AC
Start: 2023-05-18 — End: 2023-05-18
  Filled 2023-05-18: qty 50

## 2023-05-18 MED ORDER — LIDOCAINE HCL 1 % IJ SOLN
INTRAMUSCULAR | Status: AC
Start: 2023-05-18 — End: 2023-05-18
  Filled 2023-05-18: qty 20

## 2023-05-18 MED ORDER — SODIUM CHLORIDE 0.9 % IJ SOLN (CUSTOM)
20.0000 mL | INTRAMUSCULAR | Status: DC | PRN
Start: 2023-05-18 — End: 2023-05-18
  Administered 2023-05-18: 20 mL via INTRAVENOUS

## 2023-05-18 MED ORDER — HEPARIN SODIUM LOCK FLUSH 100 UNIT/ML IJ SOLN CUSTOM
500.0000 [IU] | INTRAVENOUS | Status: DC | PRN
Start: 2023-05-18 — End: 2023-05-18
  Administered 2023-05-18: 500 [IU] via INTRAVENOUS

## 2023-05-18 MED ORDER — LIDOCAINE HCL (PF) 1 % IJ SOLN
0.3000 mL | INTRAMUSCULAR | Status: DC | PRN
Start: 2023-05-18 — End: 2023-05-18
  Administered 2023-05-18: 0.3 mL via INTRADERMAL
  Filled 2023-05-18: qty 2

## 2023-05-18 MED ORDER — LIDOCAINE HCL 1 % IJ SOLN
INTRAMUSCULAR | Status: DC | PRN
Start: 2023-05-18 — End: 2023-05-18
  Administered 2023-05-18: 5 mL via INTRADERMAL

## 2023-05-18 MED ORDER — SODIUM CHLORIDE 0.9 % IV SOLN
Freq: Once | INTRAVENOUS | Status: AC
Start: 2023-05-18 — End: 2023-05-18

## 2023-05-18 MED ORDER — IODIXANOL 320 MG/ML IV SOLN
INTRAVENOUS | Status: DC | PRN
Start: 2023-05-18 — End: 2023-05-18
  Administered 2023-05-18: 10 mL

## 2023-05-18 SURGICAL SUPPLY — 12 items
BAG DRAINAGE NEPHROSTOMY 600ML (Misc Medical Supply) ×1 IMPLANT
CATHETER DRAIN MULTIPURPOSE MAC-LOC 8.5FR X 25CM (Lines/Drains) ×1 IMPLANT
DRESSING SPONGE 2X2X4 PLY (Dressings/packing) ×1
DRESSING SPONGE IV 2X2 STRL (Dressings/packing) ×1 IMPLANT
GLOVE BIOGEL PI ULTRATOUCH SIZE 6 (Gloves/Gowns) ×1
GLOVE BIOGEL PI ULTRATOUCH SIZE 6.5 (Gloves/Gowns) ×2 IMPLANT
GUIDEWIRE AMPLATZ SUPER STIFF 0.035" X 75CM, 7CM TAPER, STRAIGHT (Procedural wires/sheaths/catheters/balloons/dilators) ×1
IV3000 IV DRESSING 4" X 4 3/4" 4/CA (Dressings/packing) ×1 IMPLANT
PROCEDURE PACK - IR NON-VASCULAR (Procedure Packs/kits) ×1 IMPLANT
SUTURE ETHILON 2-0 18" FS (Suture) ×1
SYRINGE HYPO LL 10CC (Needles/punch/cannula/biopsy) ×2
SYRINGE HYPO LL 20CC (Needles/punch/cannula/biopsy) ×1

## 2023-05-18 NOTE — Telephone Encounter (Signed)
PA initiated via Covermymeds; KEY: [id number]. Awaiting determination.

## 2023-05-18 NOTE — Telephone Encounter (Signed)
PA denied.

## 2023-05-18 NOTE — Interdisciplinary (Signed)
Pt arrived to FT ambulatory with walker and unaccompanied. Patient denies N/V/D/C, SOB, fever, chills, rash, and pain. Right PAC accessed per protocol with brisk blood return and flushes well. Labs drawn from [facility] and sent. Patient discharged to waiting room for treatment in stable condition. Charge Triage RN to follow lab results.

## 2023-05-18 NOTE — Interdisciplinary (Signed)
Chemotherapy Nursing Note - [facility]    Stephen [name] is a 72 year old male who presents for chemotherapy cycle 5, day(s) 8 of enfortumab vedotin-ejfv (Padcev).  He arrived ambulatory with walker and steady gait.  He is unaccompanied today.    Current Chemotherapy Signed Informed Consent verified/obtained: Yes - 01/06/2023    Pre-treatment nursing assessment:  Stephen [name] states that he feels "okay" today.  He denies pain, fever, cough/shortness of breath, nausea/vomiting, or constipation/diarrhea.  He feels that his appetite is "terrible" and feels mildly fatigued.  Upon arrival, Stephen [name] stated, "How long is this going to take?! I need to go get my tube changed!"  He explained that his nephrostomy tube has been leaking "for more than a month" and that he is scheduled in [facility] for a nephrostomy tube change this afternoon at 1300.  RN explained that today's infusion should take just over an hour total and that he would be able to make his next appointment.  After reiterating this multiple times, Stephen [name] visibly relaxed.    Stephen [name] has received Padcev several times in the past and has tolerated infusions without difficulty.  He denies questions or concerns regarding today's treatment.    Patient met treatment parameters.  Stephen [name]'s ANC, hemoglobin, platelet count, AST/ALT, total bilirubin, and blood glucose are noted to be within treatment parameters.  The patient specifically denies vision changes, skin rashes, change in sensation/numbness/tingling in extremities, or signs/symptoms of lung disease.  Stephen [name] was pre-medicated with Zofran.    Lab Results   Component Value Date    ABSNEUTRO 3.6 05/18/2023    IANC 3.6 05/18/2023    WBC 5.7 05/18/2023    RBC 3.23 (L) 05/18/2023    HGB 9.4 (L) 05/18/2023    HCT 28.5 (L) 05/18/2023    MCV 88.2 05/18/2023    MCHC 33.0 05/18/2023    RDW 18.5 (H) 05/18/2023    PLT 218 05/18/2023       Lab Results   Component Value Date    NA 139 05/18/2023    K 4.6 05/18/2023    CL 99 05/18/2023     BICARB 31 (H) 05/18/2023    BUN 32 (H) 05/18/2023    CREAT 1.16 05/18/2023    GLU 121 (H) 05/18/2023    [place] 9.6 05/18/2023           Lab Results   Component Value Date    AST 21 05/18/2023    ALT 14 05/18/2023    GGT 131 (H) 01/17/2022    LDH 172 01/19/2022    ALK 98 05/18/2023    TP 7.0 05/18/2023    ALB 4.1 05/18/2023    TBILI 0.57 05/18/2023    DBILI <0.2 01/19/2022        Vitals:    05/18/23 0840 05/18/23 1039 05/18/23 1153   BP:  (!) 115/55 (!) 110/53   BP Location:  Left arm Left arm   BP Patient Position:  Sitting Sitting   Pulse:  61 60   Resp:  16 16   Temp:  97.7 F (36.5 C) 97.6 F (36.4 C)   TempSrc:  Temporal Temporal   SpO2:  98% 98%   Weight: 69.6 kg (153 lb 7 oz) 69.6 kg (153 lb 7 oz)    Height: 5' 6.14" (1.68 m) 5' 6.1" (1.679 m)      Pain Score: 0  Body surface area is 1.8 meters squared.  Body mass index is 24.69 kg/m.    ECOG  ECOG: 2    PHQ2  N/A - Day 8    Chemotherapy:  Medications   sodium chloride 0.9 % flush 10 mL (10 mL IntraVENOUS Given 05/18/23 1157)   heparin (HEP-LOCK) flush injection 500 Units (500 Units IntraVENOUS Given 05/18/23 1157)   sodium chloride 0.9% infusion (0 mL IntraVENOUS Stopped 05/18/23 1157)   ondansetron (ZOFRAN) injection 8 mg (8 mg IntraVENOUS Given 05/18/23 1039)   enfortumab vedotin-ejfv (PADCEV) 50 mg in sodium chloride 0.9 % 100 mL chemo infusion (0 mg IntraVENOUS IV Stop 05/18/23 1152)     Stephen [name] tolerated treatment well.  Vital signs remained stable and the patient had no signs/symptoms of infusion reaction.  Stephen [name] was discharged in stable condition.  Post blood return: Brisk  IV access post infusion: NS and Heparin 100 units/mL; Huber needle removed    Patient Education  Learner: Patient  Barriers to learning: No Barriers  Readiness to learn: Acceptance  Method: Explanation    Treatment Education:       Signs and symptoms of infection, bleeding, adverse reaction(s), symptom control, and when to notify MD.    Patient instructed on post-treatment care  and precautions specific to drug(s) administered.   If applicable, education given on vesicant administration risk and signs and symptoms of extravasation (redness, pain, swelling, pressure at IV site) to report immediately to the chemotherapy RN.    Fall Prevention Education: Instructed patient to call for assistance, call light within reach.   Pain Education: Patient instructed to contact nurse if pain should develop or if their current pain therapy becomes ineffective.       Treatment education provided to patient: yes    Response: Verbalizes understanding    Discharge Plan  Discharge instructions given to patient.  Future appointments:date given and reviewed with treatment plan.  Stephen [name] will return on 06/04/23 for his next Padcev and Keytruda infusions.  Discharge Mode: Ambulatory with walker  Discharge Time: 1201   Accompanied by: Other: Juan MA  Discharged To: Other: Interventional Radiology at [facility].

## 2023-05-18 NOTE — Telephone Encounter (Signed)
[facility] OUTPATIENT PAVILION / Medical Oncology Social Work Telephone note:    Relevant background information:  Stephen [name] is well known to this LCSW and Dr. Marca Ancona team for treatment of metastatic bladder cancer.  Please refer to my social work notes dated: 08/27/21 through 03/17/23.    Historically, Stephen [name] is an effective self advocate and asks many questions.  He presents with longstanding anxiety and rumination.  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  Stephen [name] is adherent with all treatment recommendations.    Stephen [name] is also followed by Dr. Jimmy Footman, [facility] Palliative care service, and chart review shows she made a referral for [facility].  Appt has been scheduled.    Reason for referral:  On 5/24, I received a message from Evalina Field, RD, that Stephen [name] presented to her appt with anxiety.  "I recently saw this patient for a nutrition follow up and he expressed increased general anxiety, he is working closely w/ palliative team for medication management but I felt he mind benefit from touching base with (a Child psychotherapist) to see if we can support him in any other way, he was open to hearing from someone via telephone."     Interview:  Today, I called Stephen [name] on his preferred number.  We spoke for 15 minutes.  He was polite and receptive.  Stephen [name] was busy today, with chemotherapy and having his nephrostomy tubes changed, "my body is worn out".    Stephen [name] willingly shared that he had significant anxiety and Dr. Logan Bores prescribed Atarax which has helped.  He said between the cancer and side effects of chemo ("I feel like I have ants crawling on me"), his anxiety heightened.  He said "it's getting better.... with a small dose (of anti-anxiety meds)" but he hopes to have Rx renewed on 6/10 when he goes back to [facility] team.  He has also scheduled a new appt with Dr. Reina Fuse of [facility] on 6/17, of which Stephen [name] is a little hesitant.  I reinforced that Dr. Reina Fuse will assist with anxiety  med management and encouraged Stephen [name] to f/u.  Stephen [name] v/u and indicated he'd take this under advisement.      Stephen [name] is accessing door to door transportation through his insurance, which is helping with the many appts he has at [facility].    Intervention:  Provided empathic listening and supportive counseling, anticipatory guidance.  Collaborated with medical team.  Promoted positive coping strengths/skills and self-care.  Focused on client strengths. Encouraged f/u with [facility] Psychiatry.      Impression:  Overall, Stephen [name] is open and receptive to social work.  He is a single male with metastatic cancer who has adequate support at home.  Stephen [name] understandably struggles with anxiety and reaches out for help as needed.  While unconventional and perseverative at times, Stephen [name] remains an effective self advocate and receptive and pleasant with staff.  He continues to welcome social work, is receptive to supportive counseling and validation of feelings, and expresses sincere appreciation for my call today.    Plan:  Encouraged f/u with Dr. Reina Fuse of [facility] Psych.  Will update Dr. Marca Ancona team once he calls back.   Will follow as needed.      Kingsley Plan, LCSW  [facility] Social Worker  [phone]

## 2023-05-18 NOTE — Telephone Encounter (Signed)
Hi Maria, your patient came in the office and asked where his prescription for his protein shake nepro and would like to get it prescribed or ordered, he is out of it and needs it.

## 2023-05-18 NOTE — Telephone Encounter (Signed)
[name] Service Progress Note    Received refill request for hydroxyzine 25mg  PO q8h PRN anxiety, refill sent for patient. Noted that patient still has not scheduled a Psychiatry appointment despite stat referral having been placed, and despite coming to the front desk requesting urgent anxiolytics in the past.  Team to follow up to emphasize importance of scheduling Psychiatry appointment and see if any barriers have come up for patient and to assess his anxiety.    Shea Lathyn Griggs, MD  Attending Physician  [facility] Palliative Care Service

## 2023-05-18 NOTE — Telephone Encounter (Signed)
Called pt re: his request for an rx for Nepro. Informed him Nepro rx already in place and he is set up with [facility] for supply delivery, provided [facility] enteral nutrition department phone number [phone]) for pt to call to request a refill. Also spoke w/ Donata Clay, RD from [facility] and she will pass along message to rep for them to reach out to patient to set up a refill delivery if pt does not call in. Pt v/u and was appreciative of call.     Evalina Field, RD, CSO

## 2023-05-18 NOTE — Telephone Encounter (Signed)
[name]iative [facility] Note    Presented to the pharmacy requesting Hydroxyzine refill  Did not respond to the mychart message I sent yesterday   Currently in IR    I spoke to Annalicia in Psych Dept to request help with scheduling consult  She will call pt today around 2pm and tomorrow if unable to reach him today    Hydroxyzine refill sent to [facility]     Will call pt this afternoon to assess

## 2023-05-18 NOTE — Discharge Instructions (Signed)
Interventional Radiology   DISCHARGE INSTRUCTIONS FOR PROCEDURE: PERCUTANEOUS NEPHROSTOMY (PCN) DILITATION AND TUBE PLACEMENT    SEDATION  The sedative medications used during the procedure will remain  in your system for up to 24hours. Therefore, we would like you to relax for the rest of the day.Do not drive, sign any legal documents or drink alcohol.    ACTIVITY  ? You may return to normal activity the day after the procedure.  ? No exercising or lifting heavy  objects (anything  over 10 pounds) for 72 hours  after the procedure.  ? You may remove the dressing and shower 24 hours  after the procedure, and then  redress the site daily.    PAIN MANAGEMENT  ? You may use  over-the-counter acetaminophen (TYLENOL) for minor discomfort,  if you are not  otherwise restricted from using this medication.    CARE OF THE INCISION SITE  ? Please change the dressing in 24 hours  looking for redness, swelling, or drainage.  ? Once  the initial dressing is changed, after 24 hours,  no dressing change is required for 7 days.  ? Please follow-up with your nephrologists for supplies and instructions.    WHEN TO CALL YOUR PHYSICIAN  ? Fever  (temp > 100o F) and / or chills  ? Severe pain / chest pain  ? Bleeding,  redness / drainage, or swelling at the incision site  ? Shortness of breath    MEDICATIONS  ? Please resume taking your regular  medications, unless otherwise told by your doctor.    PHYSICIAN PERFORMING YOUR PROCEDURE:  Dr.  Ramiro Harvest     ADDITIONAL INSTRUCTIONS:           CONTACT us:  ? During regular  business hours,  M-F 8:00 to 4:00, please call  [phone] [facility]) or [phone] [facility])  ? After-hours,  please call the [place] Page Operator at [phone] and ask for the Interventional Radiologist On-Call.

## 2023-05-19 ENCOUNTER — Telehealth (HOSPITAL_BASED_OUTPATIENT_CLINIC_OR_DEPARTMENT_OTHER): Payer: Self-pay

## 2023-05-19 NOTE — Telephone Encounter (Signed)
Spoke w/ [facility] RD re: pt's Nepro rx and pt's request for refills. She informed me that [facility] staff has tried multiple attempts to reach patient to coordinate refill delivery and received no response so pt will have to call [facility] to confirm refill request.     Called and spoke w/ pt, pt confirms contact with [facility] today via phone and is expecting delivery of Nepro sometime next week.     Evalina Field, RD, CSO

## 2023-05-22 ENCOUNTER — Encounter (HOSPITAL_BASED_OUTPATIENT_CLINIC_OR_DEPARTMENT_OTHER): Payer: Self-pay | Admitting: Anesthesiology

## 2023-05-22 ENCOUNTER — Telehealth (HOSPITAL_BASED_OUTPATIENT_CLINIC_OR_DEPARTMENT_OTHER): Payer: Self-pay | Admitting: Hematology & Oncology

## 2023-05-22 ENCOUNTER — Other Ambulatory Visit (HOSPITAL_BASED_OUTPATIENT_CLINIC_OR_DEPARTMENT_OTHER): Payer: Self-pay | Admitting: Physician Assistant

## 2023-05-22 DIAGNOSIS — C7910 Secondary malignant neoplasm of unspecified urinary organs: Secondary | ICD-10-CM

## 2023-05-22 DIAGNOSIS — G893 Neoplasm related pain (acute) (chronic): Secondary | ICD-10-CM

## 2023-05-22 NOTE — Telephone Encounter (Signed)
Routing message to ordering Dietician

## 2023-05-22 NOTE — Telephone Encounter (Signed)
Caller: Lori  Relationship to patient: [facility]  Phone # [phone]  [phone]  Provider: Roseanne Reno  Notes:     Lawson Fiscal calling to ask for an update on form send. Form under media 5/31. Please assist    Caller has been advised of 24-48 hr turnaround time.

## 2023-05-23 ENCOUNTER — Encounter: Payer: Self-pay | Admitting: Family Medicine

## 2023-05-23 MED ORDER — OXYCODONE HCL 10 MG OR TABS
ORAL_TABLET | ORAL | 0 refills | Status: DC
Start: 2023-05-23 — End: 2023-05-29

## 2023-05-23 NOTE — Addendum Note (Signed)
Encounter addended by: Wannetta Sender on: 05/23/2023 11:08 AM   Actions taken: Flowsheet accepted, Charge Capture section accepted

## 2023-05-24 MED ORDER — SCOPOLAMINE 1 MG/3DAYS TD PT72: 1.0000 | MEDICATED_PATCH | TRANSDERMAL | 0 refills | Status: AC

## 2023-05-24 NOTE — Telephone Encounter (Signed)
ERx 

## 2023-05-25 NOTE — Telephone Encounter (Signed)
Orders signed and faxed back to [facility] at [phone] with fax confirmation.

## 2023-05-28 ENCOUNTER — Other Ambulatory Visit (HOSPITAL_BASED_OUTPATIENT_CLINIC_OR_DEPARTMENT_OTHER): Payer: Self-pay | Admitting: Nurse Practitioner

## 2023-05-28 DIAGNOSIS — Z981 Arthrodesis status: Secondary | ICD-10-CM

## 2023-05-29 ENCOUNTER — Encounter (HOSPITAL_BASED_OUTPATIENT_CLINIC_OR_DEPARTMENT_OTHER): Payer: Self-pay | Admitting: Licensed Clinical Social Worker

## 2023-05-29 ENCOUNTER — Ambulatory Visit (HOSPITAL_BASED_OUTPATIENT_CLINIC_OR_DEPARTMENT_OTHER): Payer: Medicare Other

## 2023-05-29 ENCOUNTER — Ambulatory Visit: Payer: Medicare Other | Admitting: Family Medicine

## 2023-05-29 ENCOUNTER — Other Ambulatory Visit: Payer: Self-pay

## 2023-05-29 ENCOUNTER — Telehealth (HOSPITAL_BASED_OUTPATIENT_CLINIC_OR_DEPARTMENT_OTHER): Payer: Self-pay

## 2023-05-29 ENCOUNTER — Ambulatory Visit (HOSPITAL_BASED_OUTPATIENT_CLINIC_OR_DEPARTMENT_OTHER): Payer: Self-pay | Admitting: Speech-Language Pathologist

## 2023-05-29 VITALS — BP 99/47 | HR 57 | Temp 96.8°F | Resp 18 | Ht 66.1 in | Wt 157.4 lb

## 2023-05-29 DIAGNOSIS — J329 Chronic sinusitis, unspecified: Secondary | ICD-10-CM | POA: Insufficient documentation

## 2023-05-29 DIAGNOSIS — C7951 Secondary malignant neoplasm of bone: Secondary | ICD-10-CM | POA: Insufficient documentation

## 2023-05-29 DIAGNOSIS — G4700 Insomnia, unspecified: Secondary | ICD-10-CM | POA: Insufficient documentation

## 2023-05-29 DIAGNOSIS — R413 Other amnesia: Secondary | ICD-10-CM | POA: Insufficient documentation

## 2023-05-29 DIAGNOSIS — Z981 Arthrodesis status: Secondary | ICD-10-CM | POA: Insufficient documentation

## 2023-05-29 DIAGNOSIS — M47812 Spondylosis without myelopathy or radiculopathy, cervical region: Secondary | ICD-10-CM | POA: Insufficient documentation

## 2023-05-29 DIAGNOSIS — Z515 Encounter for palliative care: Secondary | ICD-10-CM | POA: Insufficient documentation

## 2023-05-29 DIAGNOSIS — G893 Neoplasm related pain (acute) (chronic): Secondary | ICD-10-CM | POA: Insufficient documentation

## 2023-05-29 DIAGNOSIS — K4090 Unilateral inguinal hernia, without obstruction or gangrene, not specified as recurrent: Secondary | ICD-10-CM | POA: Insufficient documentation

## 2023-05-29 DIAGNOSIS — R5383 Other fatigue: Secondary | ICD-10-CM | POA: Insufficient documentation

## 2023-05-29 DIAGNOSIS — R1310 Dysphagia, unspecified: Secondary | ICD-10-CM | POA: Insufficient documentation

## 2023-05-29 DIAGNOSIS — K5903 Drug induced constipation: Secondary | ICD-10-CM | POA: Insufficient documentation

## 2023-05-29 DIAGNOSIS — M47817 Spondylosis without myelopathy or radiculopathy, lumbosacral region: Secondary | ICD-10-CM | POA: Insufficient documentation

## 2023-05-29 DIAGNOSIS — I959 Hypotension, unspecified: Secondary | ICD-10-CM | POA: Insufficient documentation

## 2023-05-29 DIAGNOSIS — F064 Anxiety disorder due to known physiological condition: Secondary | ICD-10-CM | POA: Insufficient documentation

## 2023-05-29 DIAGNOSIS — C7910 Secondary malignant neoplasm of unspecified urinary organs: Secondary | ICD-10-CM | POA: Insufficient documentation

## 2023-05-29 DIAGNOSIS — T402X5A Adverse effect of other opioids, initial encounter: Secondary | ICD-10-CM | POA: Insufficient documentation

## 2023-05-29 DIAGNOSIS — C662 Malignant neoplasm of left ureter: Secondary | ICD-10-CM

## 2023-05-29 DIAGNOSIS — M5412 Radiculopathy, cervical region: Secondary | ICD-10-CM | POA: Insufficient documentation

## 2023-05-29 DIAGNOSIS — R110 Nausea: Secondary | ICD-10-CM | POA: Insufficient documentation

## 2023-05-29 MED ORDER — PREGABALIN 100 MG OR CAPS
100.0000 mg | ORAL_CAPSULE | Freq: Two times a day (BID) | ORAL | 0 refills | Status: DC
Start: 2023-05-29 — End: 2023-07-03
  Filled 2023-05-29: qty 60, 30d supply, fill #0

## 2023-05-29 MED ORDER — HYDROMORPHONE HCL 2 MG OR TABS
2.0000 mg | ORAL_TABLET | ORAL | 0 refills | Status: DC | PRN
Start: 2023-05-29 — End: 2023-06-14
  Filled 2023-05-29: qty 60, 10d supply, fill #0

## 2023-05-29 MED ORDER — HYDROXYZINE HCL 25 MG OR TABS
25.0000 mg | ORAL_TABLET | Freq: Three times a day (TID) | ORAL | 2 refills | Status: DC | PRN
Start: 2023-05-29 — End: 2023-09-04
  Filled 2023-05-29: qty 30, 10d supply, fill #0

## 2023-05-29 MED ORDER — DULOXETINE HCL 20 MG OR CPEP
20.0000 mg | ORAL_CAPSULE | Freq: Two times a day (BID) | ORAL | 2 refills | Status: DC
Start: 2023-05-29 — End: 2023-12-11
  Filled 2023-05-29: qty 60, 30d supply, fill #0
  Filled 2023-06-21: qty 60, 30d supply, fill #1

## 2023-05-29 MED ORDER — TIZANIDINE HCL 2 MG OR TABS
2.0000 mg | ORAL_TABLET | Freq: Three times a day (TID) | ORAL | 2 refills | Status: DC | PRN
Start: 2023-05-29 — End: 2024-01-11
  Filled 2023-05-29: qty 30, 10d supply, fill #0
  Filled 2023-06-14: qty 30, 10d supply, fill #1

## 2023-05-29 NOTE — Progress Notes (Signed)
[facility] Palliative Care   Follow-up Outpatient Clinic Consult- Social Work     ID: 72 year old male presents today with a history of metastatic urothelial carcinoma, OA, HTN, MDD, gout     Referral: Dr. Roseanne Reno for symptom management and psychosocial support.     Present: Pt and LCSW.      Medical Issues: recent nephrostomy tube exchange. Will have a chemo dose reduction. Has upcoming Pain Clinic appt on 6/21. Pain currently at Low Back.     Brief Psychosocial Assessment:  LCSW unable to join team for assessment, however was able to meet pt briefly after his palliative care appt with Dr. Logan Bores to introduce self and assess for immediate psychosocial needs. Please see former Palliative Care LCSW note for additional details. Pt is a retired Engineer, civil (consulting).     Visit was brief, he felt tired and wanted to go upstairs to have a soup before he called his transportation to pick him up.      Pt lives with his sister and  tries to get out of the home daily, even on days his energy is low. Enjoys taking the bus to the beach and walking their dog. Likes sitting at [facility]    Pt feels worried about new pain symptoms and was referred for MRI imaging. He plans to stop Physical therapy until  pain improves. He will still be mindful to stay as active as possible.     Has [facility] clinic PCP appointment tomorrow at 2pm. Has upcoming Psychiatry appointment.     He remains fairly independent at home and able to care for himself.    HCAD on file.       Referrals Provided:   None today.     Recommendations:  Provide emotional support as needed.     Reassess mood/coping as illness/tx evolves    Provide family/CG support as needed    Enage in ACP as appropriate     May benefit from engaging his sister at future visits via conference to determine if he may benefit from additional support, ie. IHSS.     Carleene Mains, [place], APHSW-C  Clinical Social Worker  [facility] Palliative Care Outpatient Service  Certified  Spanish Bilingual Provider [phone]  T: [phone]

## 2023-05-29 NOTE — Interdisciplinary (Signed)
[name] Note     Reason for visit: Follow up       Diagnosis: Cancer         Assessment:     Patient Orientation: A&O x 4, answers questions readily and appropriately with recent and remote memories intact    CC:  Follow up, medication management        Pain Assessment:    Location: Whole body  Severity:      Current pain score 1-10:  8      Worst pain score 1-10: 10      Best daily pain score 1-10: 5  Duration/Timing: All the time  Modifying factors       Aggravating factors: Movement       Alleviating factors: None    Pain Medication:   1) Oxycodone but doesn't do much per pt    Nausea/vomiting:    Nausea       Appetite:   Better than it was, eating is difficult      GI:  Bowel Movements:   - Consistency:  Hard but soft     - Constipation sometimes    Medication:   1) Miralax, pt not taking       Anxiety/depression:   Anxiety           Sleep/Fatigue   Pt sleeping a few hrs a night   Tired all the time    Medication:   1) Trazodone  2) Senna  3) Lyrica          Well Being Assessment:   -Patient's needs currently being addressed.  Patient's affect/mood appropriate for situation.     Education: Chief Operating Officer and verbal instruction provided regarding plan of care.   Barriers to learning assessed: No barriers noted. Patient verbalized understanding of teaching and instructions.     Contact information, AVS given and reviewed related to plan of care. Questions answered to patient's satisfaction and verbalized understanding. Patient/family aware to call with any questions or concerns that may arise.    Fenton Foy, LVN

## 2023-05-29 NOTE — Progress Notes (Signed)
[facility] Service Outpatient Progress Note    Requested by Dr. Brock Ra    CC: Pain, psychosocial support    ID: Stephen [name] is a 72 year old male with a history of metastatic urothelial carcinoma, OA, HTN, MDD, gout referred to Palliative Care clinic for symptom management and psychosocial support.    Interval Events:  -Stephen [name] underwent nephrostomy tube exchange on 05/18/23 with IR  -Stephen [name] had a follow up with Oncology on 05/11/23. Per review of note, planned for enfortumab vedotin dose reduction and continued pembrolizumab  -Stephen [name] has an appointment with Pain Management scheduled on 06/09/23    Subjective:    Today we met with patient in palliative care clinic. We introduced him to our team and described our role in their care. We addressed several issues during their visit:     #) Pain: See description below.   Location: Low back; feels like shoulder pain has improved; teeth/gums; also feels pain in his thighs  Quality: Burning (legs), tightness/pressure  Severity:      Current pain score 1-10: 8      Worst pain score 1-10: 10  Duration/Timing: constant  Modifying factors       Aggravating factors: sitting down, activities       Alleviating factors: oxycodone IR, pregabalin, tizanidine    -Has oxycodone IR 10mg  1.5 tablets to 2 tablets (15mg  to 20mg ) PO q4h PRN pain.  Doesn't really think it helps.  -Open to switching to hydromorphone to see if this helps his pain better  -Stopped taking tizanidine, unsure why, did find it helpful in the past  -Getting physical therapy in [place]  -Taking pregabalin 100mg  PO nightly  -Taking duloxetine CR 20mg  PO daily  -Taking diclofenac 1% topical QID PRN pain which still is helpful  -Also has followed with Pain Management (Dr. Earlene Plater)  -Denies any adverse effects of the oxycodone IR, pregabalin, duloxetine or tizanidine such as sedation, lethargy, myoclonus or pruritus  -Per CURES: oxycodone IR 10mg  #90, pregabalin 100mg  #30 on 05/05/23    #) Bowel movements:  -Had episode  of diarrhea today  -Has constipation frequently  -Taking Senna-S 50mg -8.6mg  PO BID  -Takes Miralax 17gm PO daily PRN constipation and docusate PRN for constipation  -Having regular BM    #) Nausea:  -Reports nausea due to post-nasal drip, but not taking loratidine or Flonase currently, encouraged him to restart the Flonase and counseled him that the hydroxyzine can also help with his post-nasal drip and nasal congestion  -Takes prochlorperazine 10mg  PO q6h PRN for nausea, still helpful when he needs it  -Denies vomiting    #) Nutrition:  -Still having difficulty getting dentures fixed  -Has maintained weight since last visit  -Still eating soft foods, reports some tightness when swallowing at times    #) Insomnia:  -Taking trazodone 50mg  PO nightly, does wake up frequently during night to change urine bag    #) Fatigue:  -Continues to feel quite fatigued most days, able to talk short walks    #) Dyspnea on exertion:  -No dyspnea on exertion today  -Denies any fever, chills, coughing, wheezing, hemoptysis today    #) Nephrostomy tube:  -Reports no problems with nephrostomy tube today  -Has been noticing clear drainage from nephrostomy, no hematuria.  Thinks he might be developing a UTI, noticing some darker color to urine, plans to talk to PCP about it tomorrow and to get urine test for possible UTI  -No fevers, chills, abdominal pain, flank pain  reported today    #) Dysphagia:  -Seeing Speech Therapy  -Feels a sensation of tightness in his throat sometimes  -Doing exercises that were recommended by Speech Therapy    #) Sinusitis:  -Reports postnasal drip, nasal congestion chronically (for at least the past year)  -Denies fever, chills, sinus pain, sinus pressure  -Does notice coughing  -Mentions that his PCP recently retired from clinical medicine, was referred to a new PCP who he is establishing with tomorrow (thinks her name is Dr. Burman Riis)    #) Anxiety:  -Since last visit, patient came to [facility] front desk  requesting an anxiolytic for severe anxiety.  [facility] referral was placed and he now has appointment scheduled.  He was started on hydroxyzine 25mg  PO q8h PRN anxiety  -Feels a physical sense of stress with chemotherapy, "revved up," otherwise has difficulty describing further what his anxiety feels like  -Will avoid stronger anxiolytics such as lorazepam given hx of memory problems and hx of possible opioid overdose    #) Urothelial carcinoma:  -Planned to continue on EV and pembrolizumab.  Recent PET/CT and MRI lumbar spine with pathologic fracture of left iliac wing, worsening osseous metastatic disease.  Discussed that he should avoid heavy lifting and strenuous activities    #) Possible left inguinal hernia:  -Plans to see Surgery soon for evaluation    #) Hypotension:  -Noted to have low BP today but does not report any dizziness, lightheadedness, chest pain, palpitations, PND, orthopnea or syncope    #) Psychosocial domain:  -Spoke to sister Rinaldo Cloud over the phone today.  She has noticed that Stephen [name] seems to have memory problems and forgets appointment times and forgets to take medications.  She helps to manage his medications for him and has created a medication list for him.  Discussed some strategies for how she can help manage the medications and his safety, such as giving short supplies of medications in a separate bottle (such as 5-10 hydromorphone tablets in a separate bottle) to monitor use more closely, creating a checklist and checking off when medication doses are given.  Will also place referral to Memory Clinic as she states she has been noticing the memory problems for quite some time now  -Does not have IHSS services, Rinaldo Cloud thinks he likely would not cooperate with this    #) Advance care planning:  -Health care agent: Colman Cater (sister)    ROS: Symptom Assessment      05/18/2023    10:44 AM 05/11/2023    11:05 AM 05/11/2023     8:11 AM 04/27/2023     6:00 PM 04/13/2023     4:45 PM 04/06/2023      5:41 PM 03/03/2023    11:10 AM   Palliative Care Symptom Assessment   ECOG 2 2 0  2 1 0   Palliative performance scale     50          Additional ROS reviewed in detail and negative:  Review of Systems   Constitutional:  Positive for appetite change, fatigue and unexpected weight change. Negative for chills, diaphoresis and fever.   HENT:   Positive for trouble swallowing. Negative for sore throat and voice change.         +Nasal congestion  +Post-nasal drip   Eyes:  Negative for icterus.   Respiratory:  Positive for cough. Negative for chest tightness, hemoptysis, shortness of breath and wheezing.    Cardiovascular:  Negative for chest pain, leg  swelling and palpitations.   Gastrointestinal:  Positive for nausea. Negative for abdominal pain, blood in stool, constipation, diarrhea and vomiting.        +Loose stools   Genitourinary:  Positive for difficulty urinating. Negative for dysuria, frequency and hematuria.    Musculoskeletal:  Positive for arthralgias, back pain, gait problem and neck pain.   Skin:  Negative for rash.   Neurological:  Positive for extremity weakness and gait problem. Negative for light-headedness and speech difficulty.   Psychiatric/Behavioral:  Positive for sleep disturbance. Negative for suicidal ideas. The patient is nervous/anxious.         Outpatient Palliative Regimen:  Acetaminophen 1000mg  PO q8h PRN pain   Duloxetine CR 20mg  PO daily  Oxycodone IR 10mg  1.5 to 2 tablets (15mg  to 20mg ) PO Q4H PRN pain  Prochlorperazine 10mg  PO q6h PRN nausea  Trazodone 50mg  PO nightly   Pregabalin 1 00mg  PO nightly   Miralax 17gm PO BID (takes only as needed)  Senokot 1 tablet PO BID  Lidocaine 4% patch 12 hours on / 12 hours off  Diclofenac 1% topical QID PRN pain  Narcan PRN  Hydroxyzine 25mg  PO q8h PRN anxiety  Ibuprofen 600mg  PRN      Conversion to Oral Morphine Equivalent = Unclear, not able to tell me how much oxycodone IR he takes every day    Allergies & Reactions: Review of patient's  allergies indicates Sulfa drugs    Medications: (reviewed today)  Current Outpatient Medications on File Prior to Visit   Medication Sig Dispense Refill    acetaminophen (TYLENOL) 500 MG tablet Take 2 tablets (1,000 mg) by mouth every 8 hours as needed for Mild Pain (Pain Score 1-3) or Moderate Pain (Pain Score 4-6). 40 tablet 0    albuterol (PROAIR HFA) 108 (90 Base) MCG/ACT inhaler ProAir HFA 90 mcg/actuation aerosol inhaler      allopurinol (ZYLOPRIM) 100 MG tablet Take 1 tablet (100 mg) by mouth daily. 30 tablet 0    Artificial Tear Solution (SOOTHE XP OP)       diclofenac (VOLTAREN) 1 % gel Apply 2 g topically every 6 hours as needed (Right knee pain). 1 each 0    [DISCONTINUED] DULoxetine (CYMBALTA) 20 MG CR capsule Take 1 capsule (20 mg) by mouth daily. 90 capsule 3    fluticasone propionate (FLONASE) 50 MCG/ACT nasal spray 2 sprays.      [DISCONTINUED] hydrOXYzine HCL (ATARAX) 25 MG tablet Take 1 tablet (25 mg) by mouth every 8 hours as needed for Anxiety. 30 tablet 0    ibuprofen (MOTRIN) 600 MG tablet       ketotifen (ALAWAY) 0.025 % ophthalmic solution Place 1 drop into both eyes 2 times daily.      loratadine (CLARITIN REDITABS) 10 MG dissolvable tablet       Multiple Vitamins-Minerals (MULTIVITAMIN WITH MINERALS) TABS tablet Take 1 tablet by mouth daily. 30 tablet 1    naloxone (NARCAN) 4 mg/0.1 mL nasal spray For suspected opioid overdose, call 911! Then spray once in one nostril. Repeat after 3 minutes if no or minimal response using a new spray in other nostril. 2 each 0    Nutritional Supplements (NEPRO) LIQD Take 3 Cans by mouth daily. 30000 mL 11    [DISCONTINUED] oxyCODONE (ROXICODONE) 10 MG tablet Take 1.5 tablets to 2 tablets (15mg  to 20mg ) by mouth every 4 hours as needed for pain 90 tablet 0    polyethylene glycol (MIRALAX) 17 g packet Take 1 packet (17 g)  by mouth 2 times daily as needed for Constipation. 1 each 0    [DISCONTINUED] pregabalin (LYRICA) 100 MG capsule Take 1 capsule (100 mg)  by mouth at bedtime. 30 capsule 2    prochlorperazine (COMPAZINE) 10 MG tablet Take 1 tablet (10 mg) by mouth every 6 hours as needed (Nausea/Vomiting). 30 tablet 5    senna (SENOKOT) 8.6 MG tablet Take 2 tablets (17.2 mg) by mouth 2 times daily. 30 tablet 0    traZODone (DESYREL) 50 MG tablet Take 1 tablets (50mg ) by mouth at bedtime as needed for insomnia 60 tablet 0     No current facility-administered medications on file prior to visit.       Past Medical History:  Past Medical History:   Diagnosis Date    Chronic back pain     Congenital hydronephrosis     Gout     Headache     Hematuria     HTN (hypertension) 12/10/2021    Kidney disease     Kidney stones     Major depressive disorder, single episode     Polyarthropathy or polyarthritis of multiple sites     Retinal detachment     Urethral stricture        Past Surgical History:  Past Surgical History:   Procedure Laterality Date    CT INSERTION OF SUPRAPUBIC CATH  09/25/2015    NEPHRECTOMY Right 1955    APPENDECTOMY      COLONOSCOPY      CYSTOSCOPY      CYSTOSCOPY W/ LASER LITHOTRIPSY      OTHER SURGICAL HISTORY      Interstim 01/29/2011    SPINE SURGERY  09/21    Lumbar-sacral fusion    TRANSURETHRAL RESECTION OF PROSTATE         Social History:  Healthcare agent/surrogate decision-maker: Colman Cater (sister)  Social History     Social History Narrative    Not on file        Family History:  Family History   Adopted: Yes   Family history unknown: Yes       PEX:  Vitals:    05/29/23 1027   BP: (!) 99/47   BP Location: Left arm   BP Patient Position: Sitting   BP cuff size: Regular   Pulse: 57   Resp: 18   Temp: 96.8 F (36 C)   TempSrc: Temporal   SpO2: 99%   Weight: 71.4 kg (157 lb 6.4 oz)   Height: 5' 6.1" (1.679 m)         General: No acute distress, thin, sitting in chair  HEENT: PERRLA, EOMI, no conjunctivitis or scleral icterus noted.   Nares patent, +swollen turbinates noted.  +Post-nasal drip noted.  No sinus pain with palpation.  Neck:  Trachea  midline.  Lungs: Clear to auscultation bilaterally, no wheezes, no rhonchi, no rales  CV: Regular rate and rhythm, no murmurs, no rubs, no gallops  Abdomen: Soft, nondistended, nontender to palpation, no rebound or guarding, normoactive bowel sounds in all four quadrants, no CVAT, +nephrostomy tube noted with clear urine in bag  Neuro: Oriented x3, no dysarthria, moving all four extremities spontaneously, 5/5 strength throughout.  Appears confused at times with apparent memory issue (for example, I have to repeat changes in doses of his medications several times before he appears to understand or process it)  Psych: Thought process linear and goal-directed, thought content appropriate, affect full range and appropriate, denies suicidal ideation.  No evidence  of hallucinations or delusions  Skin: Warm and well perfused.  Brisk capillary refill.      Pertinent Labs:     Lab Results   Component Value Date    WBC 5.7 05/18/2023    RBC 3.23 (L) 05/18/2023    HGB 9.4 (L) 05/18/2023    HCT 28.5 (L) 05/18/2023    MCV 88.2 05/18/2023    MCHC 33.0 05/18/2023    RDW 18.5 (H) 05/18/2023    PLT 218 05/18/2023    MPV 9.7 05/18/2023       Lab Results   Component Value Date    BUN 32 (H) 05/18/2023    CREAT 1.16 05/18/2023    CL 99 05/18/2023    NA 139 05/18/2023    K 4.6 05/18/2023    [place] 9.6 05/18/2023    TBILI 0.57 05/18/2023    ALB 4.1 05/18/2023    TP 7.0 05/18/2023    AST 21 05/18/2023    ALK 98 05/18/2023    BICARB 31 (H) 05/18/2023    ALT 14 05/18/2023    GLU 121 (H) 05/18/2023       Above labs reviewed    Pertinent Diagnostic Study Findings:  PET/CT 04/26/23  IMPRESSION:  1. Left percutaneous nephrostomy in place. There is interval increase in now moderate left hydronephrosis.  2. Multiple small abdominopelvic lymph nodes demonstrate metabolic activity at or slightly above blood pool and remain nonspecific. In particular, a periportal lymph node demonstrates slight interval increase in size and hypermetabolic activity.  Continued attention on follow-up is recommended.  3. Interval development of numerous predominantly sclerotic osseous lesions throughout the axial and proximal appendicular skeleton. When compared with prior PET-CT 11/26/2022 many of the previous hypermetabolic lesions demonstrate interval decrease in metabolic activity and increased sclerosis, most compatible with positive response to therapy. Diffuse heterogeneous hypermetabolic activity remains throughout the axial and appendicular skeleton. This may be related to a background of reactive or treatment related process. Ongoing hypermetabolic metastasis is not excluded and continued attention on follow-up is recommended.  4. Interval development of multiple mildly hypermetabolic mediastinal and hilar lymph nodes. Findings are nonspecific and could represent a superimposed infectious or inflammatory process. However, new site of metastatic disease is not excluded and close attention on follow-up is recommended.  5. Persistent focal hypermetabolic activity associated with soft tissue prominence along the left lateral aspect of the distal sigmoid colon. Findings are in the setting of large colonic stool burden and could represent stercoral colitis. Correlate with symptoms.      CT Abdomen/Pelvis 04/20/23  IMPRESSION:  Worsening osseous metastatic disease, as detailed, with a new nondisplaced pathologic fracture, measuring approximately 22 mm in transverse dimension, at the left iliac wing.     Placement of a left nephrostomy tube with pigtail component in the inferior renal cortex and medulla. Mild left hydronephrosis, improved from prior. Mild pelvic and ureteral enhancement, nonspecific, may reflect pyelitis. No evidence of pyelonephritis.     Redemonstration of moderate to large-sized stool burden with a mild amount of perirectal soft tissue edema, raising the possibility of stercoral colitis.    MRI Lumbar Spine 04/16/23  IMPRESSION:  Since the previous examination  of 12/03/2021 multiple discrete osseous lesions have developed within the spine and pelvis.     Multiple lesions are noted in the ileum bilaterally. Vertebral lesions are noted in the T11, T12 and L1 multiple lesions are noted.      Widespread osseous lesions within the spine and pelvis most consistent with  multiple metastases.      Above findings reviewed    ASSESSMENT: Stephen [name] is a 72 year old male with a history of metastatic urothelial carcinoma, OA, HTN, MDD, gout referred to Palliative Care clinic for symptom management and psychosocial support.    Medical chart reviewed in detail.  Above labs reviewed and notable for anemia, hyperglycemia  Above diagnostic images and reports reviewed by me personally and notable for left PCN in place; numerous sclerotic osseous lesions throughout the axial and proximal appendicular skeleton; mildly hypermetabolic mediastinal and hilar lymph nodes.  Recent CT A/P with new nondisplaced pathologic fracture at the left iliac wing.      Stephen [name] was seen today for pain.    Diagnoses and all orders for this visit:    Metastatic urothelial carcinoma (CMS-HCC)  -     pregabalin (LYRICA) 100 MG capsule; Take 1 capsule (100 mg) by mouth 2 times daily.  -     tizanidine (ZANAFLEX) 2 MG tablet; Take 1 tablet (2 mg) by mouth every 8 hours as needed (muscle spasm).  -     MRI Cervical Spine W/WO IV Contrast; Future  -     CURES Review Documentation - I Reviewed CURES    Pain from bone metastases (CMS-HCC)  -     HYDROmorphone (DILAUDID) 2 MG tablet; Take 1 tablet (2 mg) by mouth every 4 hours as needed for Moderate Pain (Pain Score 4-6) or Severe Pain (Pain Score 7-10).  -     tizanidine (ZANAFLEX) 2 MG tablet; Take 1 tablet (2 mg) by mouth every 8 hours as needed (muscle spasm).  -     MRI Cervical Spine W/WO IV Contrast; Future    Cancer related pain  -     HYDROmorphone (DILAUDID) 2 MG tablet; Take 1 tablet (2 mg) by mouth every 4 hours as needed for Moderate Pain (Pain Score 4-6)  or Severe Pain (Pain Score 7-10).  -     pregabalin (LYRICA) 100 MG capsule; Take 1 capsule (100 mg) by mouth 2 times daily.  -     tizanidine (ZANAFLEX) 2 MG tablet; Take 1 tablet (2 mg) by mouth every 8 hours as needed (muscle spasm).  -     CURES Review Documentation - I Reviewed CURES    Lumbosacral spondylosis without myelopathy  -     DULoxetine (CYMBALTA) 20 MG CR capsule; Take 1 capsule (20 mg) by mouth 2 times daily.    S/P lumbar fusion    Cervical radiculopathy  -     MRI Cervical Spine W/WO IV Contrast; Future    Cervical spondylosis without myelopathy  -     MRI Cervical Spine W/WO IV Contrast; Future    Therapeutic opioid induced constipation    Hypotension, unspecified hypotension type    Fatigue, unspecified type    Insomnia, unspecified type    Nausea    Dysphagia, unspecified type    Sinusitis, unspecified chronicity, unspecified location    Anxiety disorder due to general medical condition  -     pregabalin (LYRICA) 100 MG capsule; Take 1 capsule (100 mg) by mouth 2 times daily.  -     hydrOXYzine HCL (ATARAX) 25 MG tablet; Take 1 tablet (25 mg) by mouth every 8 hours as needed for Anxiety.    Memory problem  -     Consult/Referral to Neurology    Left inguinal hernia    Palliative care by specialist  PLAN:  - discontinue oxycodone IR  - start hydromorphone 2mg  PO q4h PRN severe pain.  Spoke to patient's sister on the phone, advised her to give him short supplies of medications (#5 tablets at a time) in a separate bottle to monitor his use closely and improve his safety.  - continue acetaminophen 1000mg  PO q8h PRN pain  - increase duloxetine CR to 20mg  PO BID to target pain and anxiety, monitor carefully for signs/symptoms of serotonin syndrome  - increase pregabalin to 100mg  PO BID, monitor renal function closely.    - restart tizanidine at dose of 2mg  PO q8h PRN muscle spasms  - continue hydroxyzine 25mg  PO q8h PRN anxiety  - CURES reviewed  - Narcan PRN prescribed at previous visit  and we discussed how/when to use in emergencies.  Confirmed that patient has this at home.  - continue Senna-S 1 tablet PO BID  - continue docusate 100mg  PO BID PRN constipation  - continue Miralax 17gm PO BID PRN constipation  - continue prochlorperazine 10mg  PO q6h PRN nausea  - continue trazodone 50mg   PO nightly PRN insomnia.   - continue follow up with Pain Management  - Continue Nutrition follow up  - Continue follow up with PT, Speech Therapy  - Placed Psychiatry referral, patient has upcoming appointment  - Referred to Memory Clinic to address chronic memory issues  - Advised patient to see PCP tomorrow as scheduled and request ENT referral regarding chronic postnasal drip, nasal congestion  - Agree with Surgery evaluation for possible L inguinal hernia  - please mychart message or call the palliative care clinic regarding symptom management    Follow up in palliative care clinic on 06/26/23 at 8 AM in person at [facility] (60 minutes)    Patient evaluated with the following [name] Service Members:  Hulan Fess, Spiritual Care Intern    Thank you for your consult.    Shea Blayze Haen, MD  Attending Physician  [facility] Palliative Care Service    Total Attending time 65 min:  65 min spent performing physical examination, counseling patient, and coordinating their care with medical team.

## 2023-05-29 NOTE — Telephone Encounter (Signed)
Rx done by Dr Jimmy Footman with [facility]

## 2023-05-29 NOTE — Patient Instructions (Addendum)
Stephen [name] PALLIATIVE CARE SERVICE: AFTER VISIT SUMMARY    PHONE NUMBER (Monday-Friday 8am-5pm) = [phone]   > we have several nurses on our team who all work together to ensure efficient care  > please leave [name] a voicemail and we will call you back   ?    Plan     -We will discontinue oxycodone immediate release  -We will start hydromorphone 2mg  1 tablet by mouth every 4 hours as needed for pain  -We will increase duloxetine CR to 20mg  by mouth 2 times a day  -We will increase pregabalin (Lyrica) to 100mg  by mouth 2 times a day  -Continue Senna 1 tablet by mouth 2 times a day  -Continue docusate 100mg  by mouth twice a day as needed for constipation  -Continue Miralax 1 tablespoon in water daily as needed for constipation  -Continue prochlorperazine 10mg  by mouth every 6 hours as needed for nausea  -Make sure to see Psychiatry as scheduled  -I have ordered an MRI of your cervical spine to evaluate your neck pain     We will see you for follow up on 06/26/23 at 8 AM in person at [facility] (60 minutes).       List of Stephen A. [name] Service Team Members  Providers: Davy Pique, NP, Era Skeen, DO, Berenice Primas, MD, Novella Rob, MD, Margarita Mail, MD, Clint Guy, MD  ?   Administrative Assistant/Team Coordinator: Jeannetta Ellis   ?   Social Workers: [place], [address], Marko Stai, LCSW and [employer], [place]      Spiritual Counselors/Chaplains: Lehman Prom, [facility], Dan Europe, [place] CME  ?   Nurse Case Managers: Sherald Barge, RN, Caralee Ates RN, Georjean Mode, LVN   .............................................................................................................................................     How to contact us:   ?   NURSES' LINE: ???????[phone] (Please leave a voicemail and we will call you back.)   FAX: ???????????????          [phone]   MyChart: ???????           Please use MyChart for non-emergent issues.   ?   Hours: Monday through Friday 8:00 am -  5:00 pm?      After hours: For urgent symptom issues after hours, please call [phone] and ask for the on-call palliative care doctor.   ?   Emergency: Please call 911 or go to the nearest emergency room.?   .............................................................................................................................................   ?  [facility] Chart Help Desk Phone Number: [phone]      ............................................................................................................................................     Special Instructions Regarding Medications:   ?   1. Prescription Refills   -Please let us know at least 72 hours in advance if you need a refill via phone call or via MyChart message.   -We can also prescribe all of your palliative care medications through [facility] mail order pharmacy,  -------- please let us know 4 business days in advance of running out  -------- this pharmacy is not open on the weekends   ---------no deliveries on holidays  -------- someone will need to be home to sign for the medication (will need to show drivers license)  -Pain medications to treat cancer related pain should only be prescribed by [name] (unless you need to go to the ED)   -We must see you regularly to assess you and safely prescribe  -If you miss an appointment or need to cancel/reschedule please call us right away at [phone] and leave a voicemail   ?  2. Storage    -Medicines should be stored in the original bottles in a cool, dry place.     -Please keep them securely out of reach of children and pets.   ?   3. Disposal: Please dispose of your medicines safely. Here is a link to safe disposal sites in [place]: http://trevino.com/.php    ?   4. Precaution: Please keep all medications out of reach of children.   ?    .............................................................................................................................................?     Advance Care Planning   ?   Many patients ask about the differences between an Advance Directive and a POLST.       Here is a Lobbyist that clarifies the differences between these documents:    [email or web address] ?      Differences between an Advance Directive and a POLST are:    An Advance Directive is for:    1. Anyone 15 years of age or older    2. Provides instructions for future treatment    3. Appoints a health care representative    4. Guides inpatient treatment decisions when make available       A POLST is for:    1. Anyone with a serious illness - any age   2. Provides medical orders for current treatment    3. Guides actions by Emergency Medical Personnel when made available    4. Guides inpatient treatment decisions when made available   5. This is a bright pink form that should be placed on your refrigerator    .............................................................................................................................................     Patient Experience Department   ?   The Patient Experience Department acts as a bridge between our patients, hospitals and physicians to respond to concerns, issues and requests. Patient experience specialists are here to help patients and their families communicate their experiences with [facility] and navigate our health system. Please call [phone] or send an email to [email or web address].    ..............................................Marland Kitchen    MyPath    We are excited to share our new  [facility], Cancer Services application: MyPath. MyPath will guide you through your unique cancer journey and connect you with our expansive network of curated support services and resources. It is available in [place] and [place].     You can  download the MyPath application in four easy steps:    1.       Use the QR code to open your app store.  2.       Click on the MyPath app icon and install on your device.  3.       Open the MyPath app.  4.       Enter your [place] username and password to log in.

## 2023-05-29 NOTE — Interdisciplinary (Signed)
Stephen [name] is 72 years old.  Complaining about anxiety attack.  When I inquired more, he was only able to describe body shaking.  He does have a referral to see a psychiatrist.  He suffers from several physical condition besides cancer.  He seems to be knowledgeable of his medical conditions because he used to work as a Engineer, civil (consulting).  He acknowledges that he is very tired and he looks tired.  He does go to church but not sure how often.  When I asked him how his spiritiual practice helped him, he did not give me a clear response.  When I asked what I can do for him, he said just being there for him.

## 2023-05-29 NOTE — Telephone Encounter (Signed)
Dr. Roseanne Reno,     Stephen [name] is scheduled for infusion of Enfortumab + Pembrolizumab on 6/13.     The patient has a scheduled clinic visit on 6/13.    The following items are currently outstanding:    Orders are unsigned    *As a reminder of our infusion center process, if an unsigned order and/or consent are not addressed 1 business day before the infusion center appointment, the patient appointment will be cancelled.      Thank you,   Tyson Dense RN

## 2023-05-30 ENCOUNTER — Encounter (HOSPITAL_BASED_OUTPATIENT_CLINIC_OR_DEPARTMENT_OTHER): Payer: Self-pay | Admitting: Family Medicine

## 2023-05-30 NOTE — Progress Notes (Signed)
Name: Stephen [name]   Date of Birth: 1951-11-10  Medical Record Number: [id number]    Genitourinary Medical Oncology Clinic     Patient ID: Metastatic urothelial carcinoma    Stage: Cancer Staging   Ureter malignant neoplasm, left (CMS-HCC)  Staging form: Renal Pelvis And Ureter, AJCC 8th Edition  - Clinical: Stage IV (cTX, cN0, pM1) - Signed by Cheri Guppy, MD on 12/01/2022    Oncologic History:    04/22/21  CTU: filling defect in L renal pelvis  05/05/21  Cysto with L ureteroscopy revealed a L papillary tumor, biopsied: mixed high (30%) and low grade urothelial carcinoma  06/16/21  Repeat cysto, papillary tumor: pathology low grade papillary [place]  09/xx/22  Completed induction with mitogel (6 cycles)  08/19/21  HGTa of the bladder, no muscle  08/26/21  Repeat TURBT: NED, muscle identified  10/26/21  MRU: mild thickening in the L renal pelvis  12/24/21  Cysto/L ureteroscopy: dysplasia in the bladder; no disease in L ureter  05/06/22  L PCN placed  06/01/22  TURBT and L ureteroscopy: NED, stricture in place, stent placed  09/29/22  TURBT and L ureteroscopy: NED  10/22/22  MRU: Mild diffuse thickening in the L ureter/renal pelvis. Multiple osseous lesions involving the bilateral iliac crest and right acetabulum concerning for osseous metastatic disease.   11/26/22  PET/CT: diffuse osseous FDG activity with discrete osseous lesions concerning for metastatic disease.  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  Also treated for septic shock, AKI (Cr 2.5) and acute liver injury (AST/ALT 2800/2100).   S/p exchange L PCN.   02/07/23  start XRT to R Shoulder  02/10/23  C2D1 EV (dose reduced to 1.0 mg/kg) + pembro 200 mg  IV  03/03/23  C3D1 EV/pembro, missed D8  03/06/23  Cysto: NED.  Cytology NED.   03/14/23  Seen at [facility] ED for dislodged tube, replaced  04/02-05/24  Admitted with weakness, poor PO intake.   04/06/23  C4D1 EV/pembro  04/13/23  C4D8 EV  04/26/23  PET/CT: osseous lesions much improved, many new sclerotic lesions c/w treatment change.  Nonspecific mediastinal and pelvic LN  05/09-11/24  Admitted for treatment of AKI and rhabdomyolysis  06/01/23  C6D1 EV/Pembro    Interim History:  Here to consider next cycle of EV/P, cycle 6  Eating soft foods, better than it has been  Weight is stable  Takes protein drinks  Saw pain mgmt who added dilaudid 3 days ago  Still has pain but thinks it's a little better  May be getting an injection to his neck per speech therapist  Throat looked ok on scope but his swallowing muscles are weak  L PCN is draining well, clear yellow  Constipation has improved, has been going daily for the most part  Numbness to his feet is about the same  No rash or itching  Has surgical consult about L groin hernia  Needs to get refitted for his dentures  Will be getting cement placed on the right bottom in October to help with his dentures      Review of Systems:   A complete ROS was performed and is negative except  as documented above.     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  Non smoker  No etoh    Medications:  Current Outpatient Medications on File Prior to Visit   Medication Sig Dispense Refill    acetaminophen (TYLENOL) 500 MG tablet Take 2 tablets (1,000 mg) by mouth every 8 hours as needed for Mild Pain (Pain Score 1-3) or Moderate Pain (Pain Score 4-6). 40 tablet 0    albuterol (PROAIR HFA) 108 (90 Base)  MCG/ACT inhaler ProAir HFA 90 mcg/actuation aerosol inhaler      allopurinol (ZYLOPRIM) 100 MG tablet Take 1 tablet (100 mg) by mouth daily. 30 tablet 0    Artificial Tear Solution (SOOTHE XP OP)       diclofenac (VOLTAREN) 1 % gel Apply 2 g topically every 6 hours as needed (Right knee pain). 1 each 0    DULoxetine (CYMBALTA) 20 MG CR capsule Take 1 capsule (20 mg) by mouth 2 times daily. 180 capsule 2    [DISCONTINUED] DULoxetine (CYMBALTA) 20 MG CR capsule Take 1 capsule (20 mg) by mouth daily. 90 capsule 3    fluticasone propionate (FLONASE) 50 MCG/ACT nasal spray 2 sprays.      HYDROmorphone (DILAUDID) 2 MG tablet Take 1 tablet (2 mg) by mouth every 4 hours as needed for Moderate Pain (Pain Score 4-6) or Severe Pain (Pain Score 7-10). 60 tablet 0    hydrOXYzine HCL (ATARAX) 25 MG tablet Take 1 tablet (25 mg) by mouth every 8 hours as needed for Anxiety. 30 tablet 2    [DISCONTINUED] hydrOXYzine HCL (ATARAX) 25 MG tablet Take 1 tablet (25 mg) by mouth every 8 hours as needed for Anxiety. 30 tablet 0    ibuprofen (MOTRIN) 600 MG tablet       ketotifen (ALAWAY) 0.025 % ophthalmic solution Place 1 drop into both eyes 2 times daily.      loratadine (CLARITIN REDITABS) 10 MG dissolvable tablet       Multiple Vitamins-Minerals (MULTIVITAMIN WITH MINERALS) TABS tablet Take 1 tablet by mouth daily. 30 tablet 1    naloxone (NARCAN) 4 mg/0.1 mL nasal spray For suspected opioid overdose, call 911! Then spray once in one nostril. Repeat after 3 minutes if no or minimal response using a new spray in other nostril. 2 each 0    Nutritional Supplements (NEPRO) LIQD Take 3 Cans by mouth daily. 30000 mL 11    [DISCONTINUED] oxyCODONE (ROXICODONE) 10 MG tablet Take 1.5 tablets to 2 tablets (15mg  to 20mg ) by mouth every 4 hours as needed for pain 90 tablet 0    polyethylene glycol (MIRALAX) 17 g packet Take 1 packet (17 g) by mouth 2 times daily as needed for Constipation. 1 each 0    pregabalin (LYRICA) 100 MG capsule Take 1  capsule (100 mg) by mouth 2 times daily. 60 capsule 0    [DISCONTINUED] pregabalin (LYRICA) 100 MG capsule Take 1 capsule (100 mg) by mouth at bedtime. 30 capsule 2    prochlorperazine (COMPAZINE) 10 MG tablet Take 1 tablet (10 mg) by mouth every 6 hours as needed (Nausea/Vomiting). 30 tablet 5  senna (SENOKOT) 8.6 MG tablet Take 2 tablets (17.2 mg) by mouth 2 times daily. 30 tablet 0    tizanidine (ZANAFLEX) 2 MG tablet Take 1 tablet (2 mg) by mouth every 8 hours as needed (muscle spasm). 30 tablet 2    traZODone (DESYREL) 50 MG tablet Take 1 tablets (50mg ) by mouth at bedtime as needed for insomnia 60 tablet 0     No current facility-administered medications on file prior to visit.       Physical Examination:   06/01/23  0831   BP: (!) 94/53   Pulse: 61   Temp: 98.1 F (36.7 C)   Resp: 17   SpO2: 99%     ECOG PS 1  General: Appears thin today. Some tangential conversation, this remains stable. Gait observed without walker, steady  HEENT: Moist mucous membranes.   Neck: No thyromegaly or lymphadenopathy.   Chest: Clear to ausculation bilaterally. No wheezes, rales or rhonchi.    Cardiac: Regular rate and rhythm without any murmurs, rubs or gallops.   Abdomen: Abd soft, non-tender. +BS.   Extremities: No clubbing, cyanosis. No edema  Neurological: Awake, alert and oriented x 3. Strength and sensation are grossly intact.   Psychologic: Appropriate mood and affect.  GU: L PCN site clean and dry, draining clear, yellow urine.     Labs: Following labs reviewed  Lab Results   Component Value Date    WBC 7.1 06/01/2023    RBC 3.19 (L) 06/01/2023    HGB 9.5 (L) 06/01/2023    HCT 29.5 (L) 06/01/2023    MCV 92.5 06/01/2023    MCHC 32.2 06/01/2023    RDW 17.6 (H) 06/01/2023    PLT 236 06/01/2023    MPV 9.4 06/01/2023      Lab Results   Component Value Date    BUN 30 (H) 06/01/2023    CREAT 1.13 06/01/2023    CL 100 06/01/2023    NA 140 06/01/2023    K 4.3 06/01/2023    [place] 9.4 06/01/2023    TBILI 0.41 06/01/2023    ALB 3.8  06/01/2023    TP 6.5 06/01/2023    AST 22 06/01/2023    ALK 95 06/01/2023    BICARB 29 06/01/2023    ALT 13 06/01/2023    GLU 122 (H) 06/01/2023                Imaging:    PET CT 5/08:  1. Left percutaneous nephrostomy in place. There is interval increase in now moderate left hydronephrosis.  2. Multiple small abdominopelvic lymph nodes demonstrate metabolic activity at or slightly above blood pool and remain nonspecific. In particular, a periportal lymph node demonstrates slight interval increase in size and hypermetabolic activity. Continued attention on follow-up is recommended.  3. Interval development of numerous predominantly sclerotic osseous lesions throughout the axial and proximal appendicular skeleton. When compared with prior PET-CT 11/26/2022 many of the previous hypermetabolic lesions demonstrate interval decrease in metabolic activity and increased sclerosis, most compatible with positive response to therapy. Diffuse heterogeneous hypermetabolic activity remains throughout the axial and appendicular skeleton. This may be related to a background of reactive or treatment related process. Ongoing hypermetabolic metastasis is not excluded and continued attention on follow-up is recommended.  4. Interval development of multiple mildly hypermetabolic mediastinal and hilar lymph nodes. Findings are nonspecific and could represent a superimposed infectious or inflammatory process. However, new site of metastatic disease is not excluded and close attention on follow-up is recommended.  5. Persistent focal hypermetabolic activity associated with soft tissue prominence along the left lateral aspect of the distal sigmoid colon. Findings are in the setting of large colonic stool burden and could represent stercoral colitis. Correlate with symptoms.      Assessment:  Stephen [name] is a 72 year old male with OA, HTN, MDD with L UTUC with metastatic disease ([id number]), with osseous mets (Pet/CT with diffuse  enhancement and discrete lesions). FGFR-TACC mutation.  Started on EV+Pembro.  Scans after 4 cycles with improvement of osseous lesions, but demonstrate substantial sclerotic osseous lesions, suggesting treated lesions not scene prior    Labs reviewed, stable. Still needs dental clearance prior to starting Zometa. He is going to reach out to radonc to resume R shoulder XRT. Proceed with C6 today, f/u with Dr. Roseanne Reno as scheduled. Per pt, med rec was done during visit with Dr. Logan Bores.    Advanced Urothelial Carcinoma, [id number]  - proceed with C6  - Treatment plan:  --- Enfortumab vedotin 1.25 --> 1.0 mg/kg --> 0.75 mg/kg  (dose reduced prior to C5 to 0.75) D1, D8 Q21d  --- Pembrolizumab 200 mg D1 Q21d  --- prn anti-nausea meds  --- prn antidiarrheal medications  --- prn topical skin care  --- ED precautions  - consider adding zometa given significnt bone disease; needs vit D level checked and dental clearance - he's known to have significant dental issues so this needs to be cleared first  - nutrition referral  - RTC 3 weeks with Dr. Roseanne Reno    Anxiety, worsening  - f/u with palliative care  - monitor closely    RLE edema  - improved    Cancer related pain  - follows with palliative care  - continue intermittent oxycodone.   - caution given multiple events of AMS    R shoulder pain  - 2/2 metastatic lesion  - seen by ortho 1/22  - needs follow up with rad onc to get XRT restarted  - seen by palliative care team (Dr. Margarita Mail) 2/05     L PCN, initially placed 04/2022  - draining well, will monitor          Reviewed plan with the patient.  All questions answered to patient's satisfaction.  Patient verbalized understanding and agreement with above plan.      Today's visit included charting, communication with other doctors or caregivers, review of labs, pertinent imaging and test results; treatment planning including note completion, order entry, and coordination of care - and consisted more than 50% of time in the  face-to-face patient counseling.     Vivia Ewing, MSN, FNP-C, AOCNP  [facility]     [facility]  [facility]  [address]  [place], [place] [address]  [phone]     Supervising Physician:  Dr. Brock Ra

## 2023-05-30 NOTE — Telephone Encounter (Signed)
From: Stephen [name]  To: Stephen [name]  Sent: 05/30/2023 3:38 PM PDT  Subject: past MRI location    MR#.

## 2023-05-31 ENCOUNTER — Encounter (HOSPITAL_BASED_OUTPATIENT_CLINIC_OR_DEPARTMENT_OTHER): Payer: Self-pay | Admitting: Orthopaedic Surgery of the Spine

## 2023-06-01 ENCOUNTER — Ambulatory Visit (HOSPITAL_BASED_OUTPATIENT_CLINIC_OR_DEPARTMENT_OTHER): Payer: Medicare Other

## 2023-06-01 ENCOUNTER — Encounter (HOSPITAL_BASED_OUTPATIENT_CLINIC_OR_DEPARTMENT_OTHER): Payer: Self-pay | Admitting: Nurse Practitioner

## 2023-06-01 ENCOUNTER — Ambulatory Visit
Admission: RE | Admit: 2023-06-01 | Discharge: 2023-06-01 | Disposition: A | Discharge status: Home / Self Care | Payer: Medicare Other | Attending: Physician Assistant | Admitting: Physician Assistant

## 2023-06-01 ENCOUNTER — Ambulatory Visit (HOSPITAL_BASED_OUTPATIENT_CLINIC_OR_DEPARTMENT_OTHER): Admit: 2023-06-01 | Discharge: 2023-06-01 | Payer: Medicare Other

## 2023-06-01 ENCOUNTER — Other Ambulatory Visit: Payer: Self-pay

## 2023-06-01 ENCOUNTER — Encounter (HOSPITAL_BASED_OUTPATIENT_CLINIC_OR_DEPARTMENT_OTHER): Payer: Self-pay | Admitting: Hematology & Oncology

## 2023-06-01 ENCOUNTER — Ambulatory Visit (HOSPITAL_BASED_OUTPATIENT_CLINIC_OR_DEPARTMENT_OTHER): Payer: Medicare Other | Admitting: Nurse Practitioner

## 2023-06-01 VITALS — BP 94/55 | HR 54 | Temp 97.3°F | Resp 16 | Ht 66.14 in | Wt 156.5 lb

## 2023-06-01 VITALS — BP 94/53 | HR 61 | Temp 98.1°F | Resp 17 | Ht 66.14 in | Wt 156.5 lb

## 2023-06-01 DIAGNOSIS — M549 Dorsalgia, unspecified: Secondary | ICD-10-CM

## 2023-06-01 DIAGNOSIS — R1312 Dysphagia, oropharyngeal phase: Secondary | ICD-10-CM

## 2023-06-01 DIAGNOSIS — G8929 Other chronic pain: Secondary | ICD-10-CM

## 2023-06-01 DIAGNOSIS — C662 Malignant neoplasm of left ureter: Secondary | ICD-10-CM | POA: Insufficient documentation

## 2023-06-01 DIAGNOSIS — K439 Ventral hernia without obstruction or gangrene: Secondary | ICD-10-CM

## 2023-06-01 DIAGNOSIS — C7910 Secondary malignant neoplasm of unspecified urinary organs: Secondary | ICD-10-CM

## 2023-06-01 DIAGNOSIS — K4090 Unilateral inguinal hernia, without obstruction or gangrene, not specified as recurrent: Secondary | ICD-10-CM

## 2023-06-01 DIAGNOSIS — F419 Anxiety disorder, unspecified: Secondary | ICD-10-CM

## 2023-06-01 DIAGNOSIS — Z5189 Encounter for other specified aftercare: Secondary | ICD-10-CM

## 2023-06-01 DIAGNOSIS — G893 Neoplasm related pain (acute) (chronic): Secondary | ICD-10-CM

## 2023-06-01 DIAGNOSIS — Z5112 Encounter for antineoplastic immunotherapy: Secondary | ICD-10-CM | POA: Insufficient documentation

## 2023-06-01 DIAGNOSIS — R490 Dysphonia: Secondary | ICD-10-CM

## 2023-06-01 LAB — CBC WITH DIFF, BLOOD
ANC-Automated: 4.5 10*3/uL (ref 1.6–7.0)
ANC-Instrument: 4.5 10*3/uL (ref 1.6–7.0)
Abs Basophils: 0 10*3/uL (ref <0.2)
Abs Eosinophils: 0.2 10*3/uL (ref 0.0–0.5)
Abs Lymphs: 1.7 10*3/uL (ref 0.8–3.1)
Abs Monos: 0.6 10*3/uL (ref 0.2–0.8)
Basophils: 0.4 %
Eosinophils: 2.7 %
Hct: 29.5 % — ABNORMAL LOW (ref 40.0–50.0)
Hgb: 9.5 gm/dL — ABNORMAL LOW (ref 13.7–17.5)
Imm Gran %: 0.4 % (ref <1)
Lymphocytes: 23.9 %
MCH: 29.8 pg (ref 26.0–32.0)
MCHC: 32.2 g/dL (ref 32.0–36.0)
MCV: 92.5 um3 (ref 79.0–95.0)
MPV: 9.4 fL (ref 9.4–12.4)
Monocytes: 8.8 %
Plt Count: 236 10*3/uL (ref 140–370)
RBC: 3.19 10*6/uL — ABNORMAL LOW (ref 4.60–6.10)
RDW: 17.6 % — ABNORMAL HIGH (ref 12.0–14.0)
Segs: 63.8 %
WBC: 7.1 10*3/uL (ref 4.0–10.0)

## 2023-06-01 LAB — COMPREHENSIVE METABOLIC PANEL, BLOOD
ALT (SGPT): 13 U/L (ref 0–41)
AST (SGOT): 22 U/L (ref 0–40)
Albumin: 3.8 g/dL (ref 3.5–5.2)
Alkaline Phos: 95 U/L (ref 40–129)
Anion Gap: 11 mmol/L (ref 7–15)
BUN: 30 mg/dL — ABNORMAL HIGH (ref 8–23)
Bicarbonate: 29 mmol/L (ref 22–29)
Bilirubin, Tot: 0.41 mg/dL (ref <1.2)
Calcium: 9.4 mg/dL (ref 8.5–10.6)
Chloride: 100 mmol/L (ref 98–107)
Creatinine: 1.13 mg/dL (ref 0.67–1.17)
Glucose: 122 mg/dL — ABNORMAL HIGH (ref 70–99)
Potassium: 4.3 mmol/L (ref 3.5–5.1)
Sodium: 140 mmol/L (ref 136–145)
Total Protein: 6.5 g/dL (ref 6.0–8.0)
eGFR Based on CKD-EPI 2021 Equation: >60 mL/min/{1.73_m2}

## 2023-06-01 LAB — TSH, BLOOD: TSH: 1.83 u[IU]/mL (ref 0.27–4.20)

## 2023-06-01 LAB — CORTISOL, BLOOD: Cortisol: 12.8 ug/dL

## 2023-06-01 LAB — FREE THYROXINE, BLOOD: Free T4: 1.03 ng/dL (ref 0.93–1.70)

## 2023-06-01 MED ORDER — SODIUM CHLORIDE 0.9 % IV SOLN
200.0000 mg | Freq: Once | INTRAVENOUS | Status: AC
Start: 2023-06-01 — End: 2023-06-01
  Administered 2023-06-01: 200 mg via INTRAVENOUS
  Filled 2023-06-01: qty 8

## 2023-06-01 MED ORDER — ALBUTEROL SULFATE (5 MG/ML) 0.5% IN NEBU
2.5000 mg | INHALATION_SOLUTION | Freq: Once | RESPIRATORY_TRACT | Status: AC | PRN
Start: 2023-06-01 — End: 2023-06-01

## 2023-06-01 MED ORDER — HEPARIN SODIUM LOCK FLUSH 100 UNIT/ML IJ SOLN CUSTOM
500.0000 [IU] | INTRAVENOUS | Status: DC | PRN
Start: 2023-06-01 — End: 2023-06-01
  Administered 2023-06-01: 500 [IU] via INTRAVENOUS

## 2023-06-01 MED ORDER — EPINEPHRINE 0.3 MG/0.3ML IJ SOAJ
0.3000 mg | INTRAMUSCULAR | Status: AC | PRN
Start: 2023-06-01 — End: 2023-06-01

## 2023-06-01 MED ORDER — HYDROCORTISONE SOD SUCCINATE 100 MG IJ SOLR (CUSTOM)
100.0000 mg | Freq: Once | INTRAMUSCULAR | Status: AC | PRN
Start: 2023-06-01 — End: 2023-06-01

## 2023-06-01 MED ORDER — SODIUM CHLORIDE 0.9 % IV SOLN
50.0000 mg | Freq: Once | INTRAVENOUS | Status: AC
Start: 2023-06-01 — End: 2023-06-01
  Administered 2023-06-01: 50 mg via INTRAVENOUS
  Filled 2023-06-01: qty 30

## 2023-06-01 MED ORDER — SODIUM CHLORIDE 0.9 % IJ SOLN (CUSTOM)
20.0000 mL | INTRAMUSCULAR | Status: DC | PRN
Start: 2023-06-01 — End: 2023-06-01
  Administered 2023-06-01: 20 mL via INTRAVENOUS

## 2023-06-01 MED ORDER — SODIUM CHLORIDE 0.9 % IV SOLN
INTRAVENOUS | Status: DC | PRN
Start: 2023-06-01 — End: 2023-06-01

## 2023-06-01 MED ORDER — DIPHENHYDRAMINE HCL 50 MG/ML IJ SOLN
25.0000 mg | Freq: Once | INTRAMUSCULAR | Status: AC | PRN
Start: 2023-06-01 — End: 2023-06-01

## 2023-06-01 MED ORDER — LIDOCAINE HCL (PF) 1 % IJ SOLN
0.3000 mL | INTRAMUSCULAR | Status: DC | PRN
Start: 2023-06-01 — End: 2023-06-02
  Administered 2023-06-01: 0.3 mL via INTRADERMAL
  Filled 2023-06-01: qty 2

## 2023-06-01 MED ORDER — HEPARIN SODIUM LOCK FLUSH 100 UNIT/ML IJ SOLN CUSTOM
500.0000 [IU] | INTRAVENOUS | Status: DC | PRN
Start: 2023-06-01 — End: 2023-06-02
  Administered 2023-06-01: 500 [IU] via INTRAVENOUS

## 2023-06-01 MED ORDER — SODIUM CHLORIDE 0.9 % IV BOLUS
500.0000 mL | INJECTION | Freq: Once | INTRAVENOUS | Status: DC | PRN
Start: 2023-06-01 — End: 2023-06-01

## 2023-06-01 MED ORDER — FAMOTIDINE (PF) 20 MG/2ML IV SOLN
20.0000 mg | Freq: Once | INTRAVENOUS | Status: AC | PRN
Start: 2023-06-01 — End: 2023-06-01

## 2023-06-01 MED ORDER — ONDANSETRON HCL 4 MG/2ML IV SOLN
8.0000 mg | Freq: Once | INTRAMUSCULAR | Status: AC
Start: 2023-06-01 — End: 2023-06-01
  Administered 2023-06-01: 8 mg via INTRAVENOUS
  Filled 2023-06-01: qty 4

## 2023-06-01 MED ORDER — SODIUM CHLORIDE 0.9 % IJ SOLN (CUSTOM)
20.0000 mL | INTRAMUSCULAR | Status: DC | PRN
Start: 2023-06-01 — End: 2023-06-02
  Administered 2023-06-01: 20 mL via INTRAVENOUS

## 2023-06-01 MED ORDER — SODIUM CHLORIDE 0.9 % IV SOLN
Freq: Once | INTRAVENOUS | Status: AC
Start: 2023-06-01 — End: 2023-06-01

## 2023-06-01 NOTE — Interdisciplinary (Signed)
Nurses Note: Pt in for Port-A-Cath line care and lab draw.  Vital signs stable and pt. in no apparent distress.  Pt reports no nausea, vomiting, constipation, or diarrhea.  Site clean, dry, and intact.  Line accessed and blood return verified.  Dressing and posiflow cap changed.  Line flushed with NS and Heparin 100 units/ml.  Pt waiting for lab results.  Pt discharged to lobby.

## 2023-06-01 NOTE — Patient Instructions (Addendum)
Patient Instructions:    - Follow-up with Dr. Roseanne Reno as scheduled.      - Proceed with infusions as planned    Future Appointments   Date Time Provider Department Center   06/01/2023  9:00 AM Juan Quam, NP [facility]   06/01/2023 10:00 AM MUC INFUSION/CTI MUC Onc Chem MUC   06/01/2023  1:30 PM Riley Lam, RD M[facility] Onc MUC   06/05/2023 10:30 AM [place], Rayna Sexton, MD M[facility] Gen Psy MUC   06/08/2023  9:30 AM FAST TRACK MUC FAST MUC   06/08/2023 11:30 AM MUC INFUSION/CTI MUC Onc Chem MUC   06/09/2023  1:00 PM Rodman Key, PA [facility] Pain [facility]   06/20/2023 11:00 AM Huston Foley, SP MUC Onc MUC   06/23/2023 11:45 AM TH MOBILE MRI [facility] MRI [facility]   06/26/2023  8:00 AM Shea Evans, MD MUC Onc MUC   06/29/2023  8:00 AM FAST TRACK MUC FAST MUC   06/29/2023  9:00 AM Cheri Guppy, MD [facility]   06/29/2023 10:00 AM MUC INFUSION/CTI MUC Onc Chem MUC   06/30/2023 10:00 AM Forestine Chute, MD MON Srg Tram MON   07/06/2023  8:00 AM FAST TRACK MUC FAST MUC   07/06/2023 10:00 AM MUC INFUSION/CTI MUC Onc Chem MUC          Vivia Ewing, MSN, FNP-C, AOCNP  [facility]     [facility]  [facility]  [address]  [place], [place] [address]  [phone]

## 2023-06-01 NOTE — Interdisciplinary (Signed)
Chemotherapy Nursing Note - [facility]    Stephen [name] is a 72 year old male who presents for chemotherapy   Enfortumab vedotin-ejfv (Padcev) / Pembrolizumab C6D1  Current Chemotherapy Signed Informed Consent verified/obtained yes    Pre-treatment nursing assessment:  Patient arrived ambulatory in stable condition.   Pt has chronic pain and is taking pain as needed.  Denies nausea or vomiting.   Denies diarrhea or constipation.   Pt reported moderate fatigue. Has baseline tingling and numbness to both feet.  No worsening symptoms since last treatment  No problems identified upon assessment.  Patient met treatment parameters.  Pain Score: 6  Body surface area is 1.82 meters squared.  Body mass index is 25.16 kg/m.    ECOG  ECOG: 2     PHQ2  PHQ2 Total: 3      Chemotherapy:  Stephen [name] tolerated treatment well.    Post blood return: Brisk  IV access post infusion: NS and Heparin 500 units/ml      Patient Education  Learner: Patient  Barriers to learning: No Barriers  Readiness to learn: Acceptance  Method: Explanation    Treatment Education:       Signs and symptoms of infection, bleeding, adverse reaction(s), symptom control, and when to notify MD.    Patient instructed on post-treatment care and precautions specific to drug(s) administered.   If applicable, education given on vesicant administration risk and signs and symptoms of extravasation (redness, pain, swelling, pressure at IV site) to report immediately to the chemotherapy RN.    Fall Prevention Education: Instructed patient to call for assistance, call light within reach.   Pain Education: Patient instructed to contact nurse if pain should develop or if their current pain therapy becomes ineffective.       Treatment education provided to patient: yes    Response: Verbalizes understanding    Discharge Plan  Discharge instructions given to patient.  Future appointments:date given and reviewed with treatment plan.  Discharge Mode:  Walker   Accompanied by: Self  Discharged To: Home

## 2023-06-02 ENCOUNTER — Encounter (HOSPITAL_BASED_OUTPATIENT_CLINIC_OR_DEPARTMENT_OTHER): Payer: Self-pay | Admitting: Hematology & Oncology

## 2023-06-02 DIAGNOSIS — C7910 Secondary malignant neoplasm of unspecified urinary organs: Secondary | ICD-10-CM

## 2023-06-02 DIAGNOSIS — R197 Diarrhea, unspecified: Secondary | ICD-10-CM

## 2023-06-02 MED ORDER — LOPERAMIDE HCL 2 MG OR CAPS
ORAL_CAPSULE | ORAL | 0 refills | Status: DC
Start: 2023-06-02 — End: 2023-11-06

## 2023-06-04 NOTE — Telephone Encounter (Signed)
IMAGING STUDIES:   LOV/ BILENKAYA 05-26-22   IMPRESSION:   1. 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 No visible spinal cord edema. Postcontrast cervical spine MRI is recommended to exclude an associated enhancing process which may alter management.   2. 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 Additional moderate degenerative foraminal stenosis on the left at C6-C7 and on the right at C7-T1 may also be symptomatic.   3. Fibrovascular degenerative endplate changes at C6-C7 and C7-T1 may contribute to local symptoms.     OTHER MEDICAL RECORDS/IMAGING STUDIES REVIEWED: None     IMPRESSION:   No diagnosis found.      PLAN/DISCUSSION:  Discussed the findings of the MRI with the patient. Contacted Dr. Revonda Standard , ok to proceed with MRI with contrast. Once completed patient will follow up to discuss results and if clear can proceed with possible cESI.     FOLLOWUP: Return to clinic: post MRI. The patient is encouraged to call us with any questions or problems in the interim. Our contact numbers were given to the patient.

## 2023-06-05 ENCOUNTER — Ambulatory Visit: Payer: Medicare Other | Attending: Family Medicine | Admitting: Psychiatry

## 2023-06-05 DIAGNOSIS — C679 Malignant neoplasm of bladder, unspecified: Secondary | ICD-10-CM | POA: Insufficient documentation

## 2023-06-05 DIAGNOSIS — C7910 Secondary malignant neoplasm of unspecified urinary organs: Secondary | ICD-10-CM | POA: Insufficient documentation

## 2023-06-05 DIAGNOSIS — F419 Anxiety disorder, unspecified: Secondary | ICD-10-CM | POA: Insufficient documentation

## 2023-06-05 DIAGNOSIS — C689 Malignant neoplasm of urinary organ, unspecified: Secondary | ICD-10-CM | POA: Insufficient documentation

## 2023-06-05 DIAGNOSIS — Z7409 Other reduced mobility: Secondary | ICD-10-CM

## 2023-06-05 DIAGNOSIS — F411 Generalized anxiety disorder: Secondary | ICD-10-CM | POA: Insufficient documentation

## 2023-06-05 MED ORDER — BUSPIRONE HCL 10 MG OR TABS
ORAL_TABLET | ORAL | 0 refills | Status: DC
Start: 2023-06-05 — End: 2023-06-30

## 2023-06-05 MED ORDER — TRAZODONE HCL 50 MG OR TABS
ORAL_TABLET | ORAL | 0 refills | Status: DC
Start: 2023-06-05 — End: 2023-10-19

## 2023-06-06 ENCOUNTER — Ambulatory Visit: Payer: Medicare Other | Attending: Cardiovascular Disease

## 2023-06-06 ENCOUNTER — Ambulatory Visit (INDEPENDENT_AMBULATORY_CARE_PROVIDER_SITE_OTHER): Payer: Medicare Other

## 2023-06-06 ENCOUNTER — Ambulatory Visit (HOSPITAL_BASED_OUTPATIENT_CLINIC_OR_DEPARTMENT_OTHER): Payer: Medicare Other | Admitting: Physician Assistant

## 2023-06-06 DIAGNOSIS — I714 Abdominal aortic aneurysm, without rupture, unspecified: Secondary | ICD-10-CM

## 2023-06-06 DIAGNOSIS — I359 Nonrheumatic aortic valve disorder, unspecified: Secondary | ICD-10-CM

## 2023-06-06 DIAGNOSIS — I7141 Pararenal abdominal aortic aneurysm, without rupture: Secondary | ICD-10-CM

## 2023-06-06 LAB — ECHOCARDIOGRAM COMPLETE
AR max vel: 2.57 cm2
AV Area VTI: 2.9 cm2
AV Area mean vel: 2.58 cm2
AV Mean grad: 12.5 mmHg
AV Peak grad: 23.3 mmHg
AV Vena cont: 0.8 cm
Ao pk vel: 2.42 m/s
Area-P 1/2: 2.56 cm2
Calc EF: 54.5 %
P 1/2 time: 581 msec
S' Lateral: 3.7 cm
Single Plane A2C EF: 56.3 %
Single Plane A4C EF: 52 %

## 2023-06-06 NOTE — Telephone Encounter (Signed)
Left message for patient to call for scheduling with Dr.Zlomislic, please see Irina's note for scheduling ok and assist him with the appointment when he calls back.

## 2023-06-06 NOTE — Telephone Encounter (Signed)
Spoke with Pt. Pt has been successfully scheduled for:    07/03/2023 10AM Dr Herma Carson, preferred date per pt.    Pt voiced understanding and was very appreciative.     Matter resolved.

## 2023-06-06 NOTE — Addendum Note (Signed)
Encounter addended by: Lennie Muckle on: 06/06/2023 1:46 PM   Actions taken: Charge Capture section accepted

## 2023-06-08 ENCOUNTER — Ambulatory Visit: Admit: 2023-06-08 | Discharge: 2023-06-08 | Disposition: A | Discharge status: Home Health | Payer: Medicare Other

## 2023-06-08 ENCOUNTER — Ambulatory Visit (HOSPITAL_BASED_OUTPATIENT_CLINIC_OR_DEPARTMENT_OTHER): Admit: 2023-06-08 | Payer: Medicare Other

## 2023-06-08 ENCOUNTER — Encounter (HOSPITAL_BASED_OUTPATIENT_CLINIC_OR_DEPARTMENT_OTHER): Payer: Self-pay | Admitting: Urology

## 2023-06-08 ENCOUNTER — Telehealth (HOSPITAL_BASED_OUTPATIENT_CLINIC_OR_DEPARTMENT_OTHER): Payer: Self-pay | Admitting: Urology

## 2023-06-08 VITALS — BP 104/61 | HR 55 | Temp 98.0°F | Resp 16 | Ht 66.0 in | Wt 155.2 lb

## 2023-06-08 VITALS — BP 99/73 | HR 63 | Temp 97.5°F | Resp 17 | Ht 66.14 in | Wt 154.3 lb

## 2023-06-08 DIAGNOSIS — Z95828 Presence of other vascular implants and grafts: Secondary | ICD-10-CM | POA: Insufficient documentation

## 2023-06-08 DIAGNOSIS — Z5189 Encounter for other specified aftercare: Secondary | ICD-10-CM

## 2023-06-08 DIAGNOSIS — C662 Malignant neoplasm of left ureter: Secondary | ICD-10-CM

## 2023-06-08 DIAGNOSIS — Z5112 Encounter for antineoplastic immunotherapy: Secondary | ICD-10-CM | POA: Insufficient documentation

## 2023-06-08 LAB — CBC WITH DIFF, BLOOD
ANC-Automated: 4.2 10*3/uL (ref 1.6–7.0)
ANC-Instrument: 4.2 10*3/uL (ref 1.6–7.0)
Abs Basophils: 0 10*3/uL (ref <0.2)
Abs Eosinophils: 0.2 10*3/uL (ref 0.0–0.5)
Abs Lymphs: 1.7 10*3/uL (ref 0.8–3.1)
Abs Monos: 0.6 10*3/uL (ref 0.2–0.8)
Basophils: 0.4 %
Eosinophils: 2.2 %
Hct: 30.9 % — ABNORMAL LOW (ref 40.0–50.0)
Hgb: 10.1 gm/dL — ABNORMAL LOW (ref 13.7–17.5)
Imm Gran %: 0.3 % (ref <1)
Lymphocytes: 25.3 %
MCH: 29.6 pg (ref 26.0–32.0)
MCHC: 32.7 g/dL (ref 32.0–36.0)
MCV: 90.6 um3 (ref 79.0–95.0)
MPV: 10.1 fL (ref 9.4–12.4)
Monocytes: 9.4 %
Plt Count: 226 10*3/uL (ref 140–370)
RBC: 3.41 10*6/uL — ABNORMAL LOW (ref 4.60–6.10)
RDW: 17 % — ABNORMAL HIGH (ref 12.0–14.0)
Segs: 62.4 %
WBC: 6.7 10*3/uL (ref 4.0–10.0)

## 2023-06-08 LAB — COMPREHENSIVE METABOLIC PANEL, BLOOD
ALT (SGPT): 12 U/L (ref 0–41)
AST (SGOT): 20 U/L (ref 0–40)
Albumin: 4.2 g/dL (ref 3.5–5.2)
Alkaline Phos: 100 U/L (ref 40–129)
Anion Gap: 8 mmol/L (ref 7–15)
BUN: 27 mg/dL — ABNORMAL HIGH (ref 8–23)
Bicarbonate: 31 mmol/L — ABNORMAL HIGH (ref 22–29)
Bilirubin, Tot: 0.64 mg/dL (ref <1.2)
Calcium: 9.7 mg/dL (ref 8.5–10.6)
Chloride: 102 mmol/L (ref 98–107)
Creatinine: 1.19 mg/dL — ABNORMAL HIGH (ref 0.67–1.17)
Glucose: 74 mg/dL (ref 70–99)
Potassium: 4.4 mmol/L (ref 3.5–5.1)
Sodium: 141 mmol/L (ref 136–145)
Total Protein: 6.9 g/dL (ref 6.0–8.0)
eGFR Based on CKD-EPI 2021 Equation: >60 mL/min/{1.73_m2}

## 2023-06-08 MED ORDER — SODIUM CHLORIDE 0.9 % IV SOLN
Freq: Once | INTRAVENOUS | Status: AC
Start: 2023-06-08 — End: 2023-06-08

## 2023-06-08 MED ORDER — SODIUM CHLORIDE 0.9 % IJ SOLN (CUSTOM)
20.0000 mL | INTRAMUSCULAR | Status: DC | PRN
Start: 2023-06-08 — End: 2023-06-09
  Administered 2023-06-08: 20 mL via INTRAVENOUS

## 2023-06-08 MED ORDER — HEPARIN SODIUM LOCK FLUSH 100 UNIT/ML IJ SOLN CUSTOM
500.0000 [IU] | INTRAVENOUS | Status: DC | PRN
Start: 2023-06-08 — End: 2023-06-09
  Administered 2023-06-08: 500 [IU] via INTRAVENOUS

## 2023-06-08 MED ORDER — HEPARIN SODIUM LOCK FLUSH 100 UNIT/ML IJ SOLN CUSTOM
500.0000 [IU] | INTRAVENOUS | Status: DC | PRN
Start: 2023-06-08 — End: 2023-06-08
  Administered 2023-06-08: 500 [IU] via INTRAVENOUS

## 2023-06-08 MED ORDER — LIDOCAINE HCL (PF) 1 % IJ SOLN
0.3000 mL | INTRAMUSCULAR | Status: DC | PRN
Start: 2023-06-08 — End: 2023-06-09

## 2023-06-08 MED ORDER — ONDANSETRON HCL 4 MG/2ML IV SOLN
8.0000 mg | Freq: Once | INTRAMUSCULAR | Status: AC
Start: 2023-06-08 — End: 2023-06-08
  Administered 2023-06-08: 8 mg via INTRAVENOUS
  Filled 2023-06-08: qty 4

## 2023-06-08 MED ORDER — SODIUM CHLORIDE 0.9 % IV SOLN
50.0000 mg | Freq: Once | INTRAVENOUS | Status: AC
Start: 2023-06-08 — End: 2023-06-08
  Administered 2023-06-08: 50 mg via INTRAVENOUS
  Filled 2023-06-08: qty 30

## 2023-06-08 MED ORDER — SODIUM CHLORIDE 0.9 % IJ SOLN (CUSTOM)
20.0000 mL | INTRAMUSCULAR | Status: DC | PRN
Start: 2023-06-08 — End: 2023-06-08
  Administered 2023-06-08: 20 mL via INTRAVENOUS

## 2023-06-08 NOTE — Telephone Encounter (Signed)
Caller: Pt  Relationship to patient: self   Phone # [phone]  Provider: Adelene Amas   Notes:     Pt is calling is calling in regards to the Flomax. Pt would like to know if he is still supposed to be taking still. PT would like a call back or a mychart.  Please assist.     Caller has been advised of 48-72 hr turnaround time.

## 2023-06-08 NOTE — Interdisciplinary (Signed)
Chemotherapy Nursing Note - [facility]    Stephen [name] is a 72 year old male who presents for chemotherapy cycle 6, day(s) 8 of enfortumab vedotin (PADCEV) infusion.    Current Chemotherapy Signed Informed Consent verified/obtained yes    Pre-treatment nursing assessment:  Pt arrived ambulatory to appointment, pt is alert and oriented x4. Pt denies nausea, vomiting, and constipation. Pt states he experienced diarrhea after last treatment, per pt he took Imodium at home, no interventions requested at this time. Pt denies SOB and cough. Pt states pain, per pt he had pain medication at home prior to appointment, no interventions requested at this time. Pt denies visual changes, skin rashes, numbness, tingling, muscle weakness, and changes in sensation. Pt denies signs and symptoms of pneumonitis or interstitial lung disease. Pt agreeable to todays treatment.        Patient met treatment parameters.    Lab Results   Component Value Date    ABSNEUTRO 4.2 06/08/2023    IANC 4.2 06/08/2023    WBC 6.7 06/08/2023    RBC 3.41 (L) 06/08/2023    HGB 10.1 (L) 06/08/2023    HCT 30.9 (L) 06/08/2023    MCV 90.6 06/08/2023    MCHC 32.7 06/08/2023    RDW 17.0 (H) 06/08/2023    PLT 226 06/08/2023       Lab Results   Component Value Date    NA 141 06/08/2023    K 4.4 06/08/2023    CL 102 06/08/2023    BICARB 31 (H) 06/08/2023    BUN 27 (H) 06/08/2023    CREAT 1.19 (H) 06/08/2023    GLU 74 06/08/2023    [place] 9.7 06/08/2023           Lab Results   Component Value Date    AST 20 06/08/2023    ALT 12 06/08/2023    GGT 131 (H) 01/17/2022    LDH 172 01/19/2022    ALK 100 06/08/2023    TP 6.9 06/08/2023    ALB 4.2 06/08/2023    TBILI 0.64 06/08/2023    DBILI <0.2 01/19/2022        Vitals:    06/08/23 0855 06/08/23 1113 06/08/23 1315   BP:  (!) 113/58 104/61   BP Location:  Left arm Left arm   BP Patient Position:  Sitting Sitting   Pulse:  51 55   Resp:  16 16   Temp:  98 F (36.7 C) 98 F (36.7 C)   TempSrc:  Temporal Temporal    SpO2:  100%    Weight: 70 kg (154 lb 5.2 oz) 70.4 kg (155 lb 3.3 oz)    Height: 5' 6.14" (1.68 m) 5\' 6"  (1.676 m)      Pain Score: 4  Body surface area is 1.81 meters squared.  Body mass index is 25.05 kg/m.    ECOG  ECOG: 2 Not Applicable      PAC to RCW accessed in fast track. PAC flushed with positive blood return.     Chemotherapy:  Stephen [name] tolerated treatment well.    Post blood return: Brisk  IV access post infusion: NS/Heparin and PAC de-accessed by Tami Ribas, RN (resource).       Patient Education  Learner: Patient  Barriers to learning: No Barriers  Readiness to learn: Acceptance  Method: Explanation    Treatment Education:       Signs and symptoms of infection, bleeding, adverse reaction(s), symptom control, and when to notify  MD.    Patient instructed on post-treatment care and precautions specific to drug(s) administered.   If applicable, education given on vesicant administration risk and signs and symptoms of extravasation (redness, pain, swelling, pressure at IV site) to report immediately to the chemotherapy RN.    Fall Prevention Education: Instructed patient to call for assistance, call light within reach.   Pain Education: Patient instructed to contact nurse if pain should develop or if their current pain therapy becomes ineffective.       Treatment education provided to patient: yes    Response: Verbalizes understanding    Discharge Plan  Discharge instructions given to patient. Pt discharged by Tami Ribas, RN (resource).   Future appointments:date given and reviewed with treatment plan. Next appointment 7/11.  Discharge Mode: Ambulatory with walker.   Discharge Time: 1318    Accompanied by: Self  Discharged To: Home     Medications   sodium chloride 0.9 % flush 20 mL (20 mL IntraVENOUS Given 06/08/23 1124)   heparin (HEP-LOCK) flush injection 500 Units (500 Units IntraVENOUS Given 06/08/23 1314)   sodium chloride 0.9% infusion (0 mL IntraVENOUS Stopped 06/08/23 1314)   ondansetron (ZOFRAN)  injection 8 mg (8 mg IntraVENOUS Given 06/08/23 1136)   enfortumab vedotin-ejfv (PADCEV) 50 mg in sodium chloride 0.9 % 100 mL chemo infusion (0 mg IntraVENOUS IV Stop 06/08/23 1243)

## 2023-06-08 NOTE — Interdisciplinary (Signed)
Nurses Note: Pt in for Port-A-Cath line care and lab draw.  Vital signs stable and pt. in no apparent distress.  Pt reports no nausea, vomiting, constipation, or diarrhea.  Site clean, dry, and intact.  Line accessed and blood return verified.  Dressing and posiflow cap changed.  Line flushed with NS and Heparin 100 units/ml.  Pt waiting for lab results.  Pt discharged to lobby.

## 2023-06-08 NOTE — Telephone Encounter (Signed)
2pt ID Verified for QA  RN reviewed med list; No recent Rx for Flomax  Spoke w/ pt and notified to send [name] picture of Flomax bottle to check for Rx date  Pt has not been on Flomax for quite some time >18yr per pt; he is wondering if he needs it again since he still has a bottle of it. He will send a picture of the bottle to mychart and we can check if it is an old bottle. No recent Rx refill sent for him.

## 2023-06-09 ENCOUNTER — Other Ambulatory Visit (HOSPITAL_BASED_OUTPATIENT_CLINIC_OR_DEPARTMENT_OTHER): Payer: Self-pay | Admitting: Vascular & Interventional Radiology

## 2023-06-09 ENCOUNTER — Ambulatory Visit: Payer: Medicare Other | Attending: Physician Assistant | Admitting: Physician Assistant

## 2023-06-09 ENCOUNTER — Telehealth (INDEPENDENT_AMBULATORY_CARE_PROVIDER_SITE_OTHER): Payer: Self-pay

## 2023-06-09 VITALS — BP 102/64 | HR 57

## 2023-06-09 DIAGNOSIS — M7918 Myalgia, other site: Secondary | ICD-10-CM | POA: Insufficient documentation

## 2023-06-09 DIAGNOSIS — C642 Malignant neoplasm of left kidney, except renal pelvis: Secondary | ICD-10-CM | POA: Insufficient documentation

## 2023-06-09 DIAGNOSIS — C679 Malignant neoplasm of bladder, unspecified: Secondary | ICD-10-CM

## 2023-06-09 DIAGNOSIS — N179 Acute kidney failure, unspecified: Secondary | ICD-10-CM

## 2023-06-09 DIAGNOSIS — N1330 Unspecified hydronephrosis: Secondary | ICD-10-CM

## 2023-06-09 NOTE — Telephone Encounter (Signed)
Clinic still needed: No  LVM to schedule procedure:Tube Check   Is time held: Yes 06/27, [facility]

## 2023-06-09 NOTE — Patient Instructions (Addendum)
What is a Trigger Point?  These are tight, dysfunctional, and painful bands of muscle that are often located in the neck, upper back, and lower back.     These trigger points are usually very tender to applied pressure, and can often radiates pain outwards when pressed. Over time, this muscular tension can reduce blood flow to the painful regions and worsen the pain cycle.     This condition is often referred to as myofascial pain syndrome by healthcare providers.     What is a Research scientist (physical sciences)?  A trigger point injection involves the use of a small needle to administer local anesthetic medication directly into painful areas within the muscles. This helps to relax the muscles and break the cycle of dysfunction. Your physician may also use a technique known as dry needling to help break up the tight tissue and stimulate increased blood flow to the area.      In certain cases, your physician may choose to inject Botox (botulinum toxin) into the trigger points. This medication reduces muscle contraction at a molecular level, and can also be very effective in treating myofascial pain.     How Are Trigger Point Injections Performed?  You will be asked to sit or lie in a position in which the affected are be accessible to your physician.     Cleaning solution is applied to site of the pain. Your physician will then use a small needle to inject some local anesthetic medication into each of the painful areas of the affected muscles.     For trigger point injections of certain structures, such as the piriformis muscle, image guidance with either ultrasound or fluoroscopy (X-ray) will be utilized to ensure accurate placement of the needle.     Risks and Complications:  Trigger point injections are considered very safe in general.     However, as with any minor medical procedure, there are potential risks. This includes soreness, bleeding, bruising, or infection. If the procedure is done in the upper back or chest,  then pneumothorax (collapsed lung) is also a potential rare complication.     We will take every measure to minimize these potential risks and maximize the therapeutic benefit.    You have been referred to the Department of Osteopathic Manual Medicine for a clinic consult. Please call to schedule an appointment at your earliest convenience.  Insurance varies from person to person and you may also need an insurance authorization prior to being seen.     Osteopathic Manual Medicine Department scheduling phone number:  [phone]     Osteopathic Manual Medicine:  Doctors of Osteopathy (DO) go through educational training similar to allopathic (MD) doctors, and are similarly licensed to practice a full scope of medicine in any specialty of choice. DOs are also trained in a variety of different manual medicine procedures - using their hands and touch to diagnose and treat you. They may work on your bones, joints, muscles, fascia, nerves, lymphatics, circulation and more over the course of a treatment. Osteopathic manual medicine can improve your health and alleviate your symptoms by restoring balance in your body and restoring your body's inherent ability to heal and regulate. Each treatment course is different and individually tailored to your needs and specific situation.       Most insurances cover osteopathic services. We look forward to serving your Osteopathic Manual Medicine needs.

## 2023-06-09 NOTE — Telephone Encounter (Signed)
Inbound call to patient       Reason for call: Pt noticed sutures were no longer intact during dressing change yesterday.  Denies fever/chills, flank pain, redness, swelling or drainage at site.  Draining well into bag, denies leaking at tube site.      Plan: Pt told to secure nephrostomy tube with tape, empty bag frequently and secure to clothing.  Pt prefers to have tube resutured if possible.  . Discussed red flag symptoms/ED precautions.  Pt verbalized understanding and thankful for follow up. Will need case request for resuture.  Route to APP team for further review and recommendations.    Date of Procedure: 05/18/2023  Procedure Type: nephrostomy exchange on the left side with an 8.5 Fr tube.   Provider: Dr. Ramiro Harvest    Current Associated Symptoms:  [] Fever  [] Chills  [] Leaking Tube [] Drainage  [] No Output [] Pain -       Medication Review:   Patient is taking: N/A    MyChart Photo: No

## 2023-06-09 NOTE — Addendum Note (Signed)
Encounter addended by: Cheri Guppy, MD on: 06/09/2023 9:09 AM   Actions taken: Treatment plan modified

## 2023-06-09 NOTE — Progress Notes (Unsigned)
PAIN CLINIC FOLLOW UP NOTE    Primary Care Physician No Pcp, Per Patient    Chief Complaint: Neck Pain (Chronic;severe) and Shoulder Pain (Right side; chronic; not severe)      SUBJECTIVE:  This is a 72 year old male who  has a past medical history of Chronic back pain, Congenital hydronephrosis, Gout, Headache, Hematuria, HTN (hypertension) (12/10/2021), Kidney disease, Kidney stones, Major depressive disorder, single episode, Polyarthropathy or polyarthritis of multiple sites, Retinal detachment, and Urethral stricture. and  has a past surgical history that includes Nephrectomy (Right, 1955); CT Insertion Of Suprapubic Cath (09/25/2015); Other surgical history; Appendectomy; Cystoscopy; Cystoscopy w/ laser lithotripsy; Colonoscopy; Transurethral resection of prostate; and Spine surgery (09/21). 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 Christa See on 11/12/21     Reports pain in neck, right arm, low back and throat.   He was told by Speech Therapist that his muscles are weak. He will be undergoing MRI Cervical Spine.    Today he would like to address neck pain and stiffness.  In the past did not have much success with procedures but interested in trialing again.    On the pain diagram today the patient shades in the areas of their neck and low back.     The patient stated their pain today is Pain Score: 6/10.       [facility]  Main reason for today's visit: pain control neck and very lower back  Any other concerns: just every day things  Have you had a pain procedure since your last visit? No  What procedure(s)?    How much relief have pain procedures provided?    Since your LAST visit to the pain clinic until NOW, how much  relief have pain medications provided? 0% (no relief)  Over the last 12 months, how many sessions of physical therapy have you done? 1-7  Of those sessions of physical therapy, how many were over the last 6 months? 1-7  If you had physical therapy, did it help? Didn't help  Do you do home exercises? Yes  What exercises do you do? walking stretches body exercises  What does the pain feel like? Tingling;Numbing;Cramping;Squeezing;Sharp;Heavy  Do you have? Weakness;Muscle Spasms;Tingling;Tightness  Does the pain make it hard for you to: Walk;Sleep;Sit;Exercise  What other things are hard for you to do?    Rate your pain at its worst in the past week: 9  Rate your pain at its least in the past week: 9  Rate your pain on average in the past week: 9  What number best describes how, during the past week, pain has interfered with your enjoyment of life: 6  What number best describes how, during the past week, pain has interfered with your general activity: 6  PEG Score: 7  Have you had any new studies done to evaluate your pain since your last visit? Yes  Please list:      Pain Treatments  Previous pain procedures, dates, and response include:   11/24/2017 Lumbar ESI minimal relief  01/23/2018 Cervical ESI minimal relief  05/31/2019 Left L3-5 MBB >50% relief  11/26/2018 Left L3-5 MBB RFA No relief  03/24/2020 Left SI joint injection minimal relief  04/08/2020 Left L5-S1  TF ESI minimal relief  07/22/2021 TPI low back - no relief  08/02/2022 Caudal ESI - <10% relief       Past Medical History:   Diagnosis Date    Chronic back pain     Congenital hydronephrosis     Gout     Headache     Hematuria     HTN (hypertension) 12/10/2021    Kidney disease     Kidney stones     Major depressive disorder, single episode     Polyarthropathy or polyarthritis of multiple sites     Retinal detachment     Urethral stricture      Past Surgical History:   Procedure Laterality Date    CT INSERTION OF SUPRAPUBIC CATH  09/25/2015    NEPHRECTOMY Right 1955     APPENDECTOMY      COLONOSCOPY      CYSTOSCOPY      CYSTOSCOPY W/ LASER LITHOTRIPSY      OTHER SURGICAL HISTORY      Interstim 01/29/2011    SPINE SURGERY  09/21    Lumbar-sacral fusion    TRANSURETHRAL RESECTION OF PROSTATE       Current Outpatient Medications   Medication Sig Dispense Refill    acetaminophen (TYLENOL) 500 MG tablet Take 2 tablets (1,000 mg) by mouth every 8 hours as needed for Mild Pain (Pain Score 1-3) or Moderate Pain (Pain Score 4-6). 40 tablet 0    albuterol (PROAIR HFA) 108 (90 Base) MCG/ACT inhaler ProAir HFA 90 mcg/actuation aerosol inhaler      allopurinol (ZYLOPRIM) 100 MG tablet Take 1 tablet (100 mg) by mouth daily. 30 tablet 0    Artificial Tear Solution (SOOTHE XP OP)       busPIRone (BUSPAR) 10 MG tablet One tab po daily for 5 days, then one tab po bid 60 tablet 0    diclofenac (VOLTAREN) 1 % gel Apply 2 g topically every 6 hours as needed (Right knee pain). 100 g 0    DULoxetine (CYMBALTA) 20 MG CR capsule Take 1 capsule (20 mg) by mouth 2 times daily. 180 capsule 2    fluticasone propionate (FLONASE) 50 MCG/ACT nasal spray 2 sprays.      HYDROmorphone (DILAUDID) 2 MG tablet Take 1 tablet (2 mg) by mouth every 4 hours as needed for Moderate Pain (Pain Score 4-6) or Severe Pain (Pain Score 7-10). 60 tablet 0    hydrOXYzine HCL (ATARAX) 25 MG tablet Take 1 tablet (25 mg) by mouth every 8 hours as needed for Anxiety. 30 tablet 2    ibuprofen (MOTRIN) 600 MG tablet       ketotifen (ALAWAY) 0.025 % ophthalmic solution Place 1 drop into both eyes 2 times daily.      loperamide (IMODIUM) 2 MG capsule For mild to moderate diarrhea (less than 5 BMs within 24 hours):  Take 1 capsule (2 mg) by mouth 4 times daily as needed for Diarrhea.  For severe Diarrhea (5 or more medium to large BMs within 24 hours):  take 2 capsules and then 1 capsule every 2 hours until diarrhea stops. 90 capsule 0    loratadine (CLARITIN REDITABS) 10 MG dissolvable tablet       Multiple Vitamins-Minerals  (MULTIVITAMIN WITH MINERALS) TABS tablet Take 1 tablet by mouth daily. 30 tablet 1    naloxone (NARCAN) 4 mg/0.1 mL nasal spray For suspected opioid overdose, call 911! Then spray once in one nostril. Repeat after 3 minutes if no or minimal  response using a new spray in other nostril. 2 each 0    Nutritional Supplements (NEPRO) LIQD Take 3 Cans by mouth daily. 30000 mL 11    polyethylene glycol (MIRALAX) 17 g packet Take 1 packet (17 g) by mouth 2 times daily as needed for Constipation. 1 each 0    pregabalin (LYRICA) 100 MG capsule Take 1 capsule (100 mg) by mouth 2 times daily. 60 capsule 0    prochlorperazine (COMPAZINE) 10 MG tablet Take 1 tablet (10 mg) by mouth every 6 hours as needed (Nausea/Vomiting). 30 tablet 5    senna (SENOKOT) 8.6 MG tablet Take 2 tablets (17.2 mg) by mouth 2 times daily. 30 tablet 0    tizanidine (ZANAFLEX) 2 MG tablet Take 1 tablet (2 mg) by mouth every 8 hours as needed (muscle spasm). 30 tablet 2    traZODone (DESYREL) 50 MG tablet Take one tab nightly by mouth at bedtime as needed for insomnia 90 tablet 0     No current facility-administered medications for this visit.     Allergies   Allergen Reactions    Sulfa Drugs Unspecified, Hives and Other     Social History     Socioeconomic History    Marital status: Single     Spouse name: Not on file    Number of children: Not on file    Years of education: Not on file    Highest education level: Not on file   Occupational History    Not on file   Tobacco Use    Smoking status: Never     Passive exposure: Never    Smokeless tobacco: Never   Vaping Use    Vaping status: Never Used   Substance and Sexual Activity    Alcohol use: No    Drug use: Not Currently    Sexual activity: Not Currently     Partners: Female   Other Topics Concern    Military Service No    Blood Transfusions Yes    Caffeine Concern No    Occupational Exposure No    Hobby Hazards No    Sleep Concern Yes     Comment: due to my lower back discomfort and leg discomfort     Stress Concern Yes     Comment: Health/ housing/ financial    Weight Concern No    Special Diet No    Back Care Yes     Comment: Am very careful with any activity    Exercises Regularly Yes    Seat Belt Use Yes    Performs Self-Exams Yes    Bike Helmet Use No   Social History Narrative    Not on file     Social Determinants of Health     Financial Resource Strain: Not on file   Food Insecurity: No Food Insecurity (04/28/2023)    Hunger Vital Sign     Worried About Running Out of Food in the Last Year: Never true     Ran Out of Food in the Last Year: Never true   Transportation Needs: No Transportation Needs (04/28/2023)    PRAPARE - Therapist, art (Medical): No     Lack of Transportation (Non-Medical): No   Physical Activity: Not on file   Stress: Not on file   Social Connections: Unknown (01/31/2023)    Received from [facility]    Social Connections     In the past 3 months, do you feel that  you lack companionship or social support?: Not on file   Intimate Partner Violence: Low Risk  (04/28/2023)    [place] IPV     Have you ever been emotionally or physically abused by your partner or someone important to you?: No     Within the last year have you been hit, slapped, kicked or otherwise physically hurt by someone?: No     Since you've been pregnant, have you been slapped, kicked or otherwise physically hurt by someone?: Not on file     Within the last year has anyone forced you to have sexual activities?: No     Are you afraid of your spouse or partner you listed above?: No   Housing Stability: Unknown (04/28/2023)    Housing Stability Vital Sign     Unable to Pay for Housing in the Last Year: No     Number of Places Lived in the Last Year: Not on file     Unstable Housing in the Last Year: No     Family History   Adopted: Yes   Family history unknown: Yes       Physical Exam:   Vitals: BP 102/64 (BP Location: Right arm, BP Patient Position: Sitting)   Pulse 57   SpO2 99%   Constitutional:  Vital signs listed above.alert, no distress  Psych: alert, oriented. Speech is clear/ normal. Affect is euthymic.  Eyes: Sclera white, conjunctiva clear, lids are without lag. Pupils equal, not pinpoint.  Skin: Skin color, texture, turgor normal. No rashes or lesions.  Musculoskeletal:  C-Spine     Cervical taut, tender bands consistent with active trigger points: Bilateral, cervical paraspinals, trapezius  Labs and Imaging:     IR Change Nephrostomy Tube    Result Date: 05/25/2023  IMPRESSION: Successful nephrostomy exchange on the left side with an 8.5 Fr tube. Plan: Preventive maintenance nephrostomy exchange in 12 weeks.     [name] Lower Extremity Venous Duplex Bilateral    Result Date: 05/03/2023  IMPRESSION: No evidence of deep venous thrombosis in the examined veins.     US Kidney Complete    Result Date: 04/28/2023  IMPRESSION: Moderate left hydronephrosis with percutaneous nephrostomy tube. This is unchanged compared to 04/20/2023 CT abdomen and pelvis and 10/22/2022 MRI urogram given differences in technique.     PET/CT Skull To Mid Thigh (Non Diag CT For [facility])    Result Date: 04/26/2023  IMPRESSION: 1. Left percutaneous nephrostomy in place. There is interval increase in now moderate left hydronephrosis. 2. Multiple small abdominopelvic lymph nodes demonstrate metabolic activity at or slightly above blood pool and remain nonspecific. In particular, a periportal lymph node demonstrates slight interval increase in size and hypermetabolic activity. Continued attention on follow-up is recommended. 3. Interval development of numerous predominantly sclerotic osseous lesions throughout the axial and proximal appendicular skeleton. When compared with prior PET-CT 11/26/2022 many of the previous hypermetabolic lesions demonstrate interval decrease in metabolic activity and increased sclerosis, most compatible with positive response to therapy. Diffuse heterogeneous hypermetabolic activity remains throughout the axial and  appendicular skeleton. This may be related to a background of reactive or treatment related process. Ongoing hypermetabolic metastasis is not excluded and continued attention on follow-up is recommended. 4. Interval development of multiple mildly hypermetabolic mediastinal and hilar lymph nodes. Findings are nonspecific and could represent a superimposed infectious or inflammatory process. However, new site of metastatic disease is not excluded and close attention on follow-up is recommended. 5. Persistent focal hypermetabolic activity associated with soft tissue  prominence along the left lateral aspect of the distal sigmoid colon. Findings are in the setting of large colonic stool burden and could represent stercoral colitis. Correlate with symptoms.     CT Abdomen And Pelvis With Contrast    Result Date: 04/20/2023  IMPRESSION: Worsening osseous metastatic disease, as detailed, with a new nondisplaced pathologic fracture, measuring approximately 22 mm in transverse dimension, at the left iliac wing. Placement of a left nephrostomy tube with pigtail component in the inferior renal cortex and medulla. Mild left hydronephrosis, improved from prior. Mild pelvic and ureteral enhancement, nonspecific, may reflect pyelitis. No evidence of pyelonephritis. Redemonstration of moderate to large-sized stool burden with a mild amount of perirectal soft tissue edema, raising the possibility of stercoral colitis. Important but non urgent findings/recommendations delineated above were verbally communicated by Charolett Bumpers to the care provider, [facility] [place], at 22:51 on 04/20/2023.   CTRM:2003:verbal.     MRI Lumbar Spine W/WO IV Contrast    Result Date: 04/17/2023  IMPRESSION: Since the previous examination of 12/03/2021 multiple discrete osseous lesions have developed within the spine and pelvis. Multiple lesions are noted in the ileum bilaterally. Vertebral lesions are noted in the T11, T12 and L1 multiple lesions are noted.   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     X-Ray Chest Single View    Result Date: 03/21/2023  IMPRESSION: No acute findings to explain the provided history except for new port currently being accessed, and possible skeletal metastases worsened or new since last chest x-ray May 5th of last year    IR Placement Vascular Port    Result Date: 02/13/2023  IMPRESSION: Successful right sided power injectable chest port placement. Plan: The port may be used immediately; please be mindful of the glue during port access- do not place Tegaderm over glue for any reason.     IR Biopsy Bone    Result Date: 12/29/2022  IMPRESSION: Successful CT guided biopsy of left iliac bone lesion     X-Ray Shoulder Complete Min 2 Views - Right    Result Date: 12/15/2022  IMPRESSION: No fracture. Focal lytic glenoid lesion with high uptake seen on recent PET is barely visible on x-ray. Mild degenerative changes.         Assessment and Plan:  No diagnosis found.    This is a 72 year old male who  has a past medical history of Chronic back pain, Congenital hydronephrosis, Gout, Headache, Hematuria, HTN (hypertension) (12/10/2021), Kidney disease, Kidney stones, Major depressive disorder, single episode, Polyarthropathy or polyarthritis of multiple sites, Retinal detachment, and Urethral stricture. and  has a past surgical history that includes Nephrectomy (Right, 1955); CT Insertion Of Suprapubic Cath (09/25/2015); Other surgical history; Appendectomy; Cystoscopy; Cystoscopy w/ laser lithotripsy; Colonoscopy; Transurethral resection of prostate; and Spine surgery (09/21). who presents for Neck Pain (Chronic;severe) and Shoulder Pain (Right side; chronic; not severe)  He is a candidate to proceed with trigger point injections to address myofascial component of his neck pain.   Also recommending Consult with OM.        Today reviewed notes from prior visit(s) with [facility]        Risks applicable to today's encounter and procedural risks relevant to the patient include:   decision regarding procedure(s) with identified patient or procedure risk factors with identified patient or procedure risk factors  risk of worsening of pain in setting of chronic pain disorder with possible central  sensitization    An injection is medically necessary at this time as patient continues to have functional limitations in daily activities and diminished quality of life secondary to pain in spite of a trial of conservative treatments >6 weeks over the past 6 months, which have included physical therapy, patient education, psychosocial support, and oral medications. In this context, an injection will be provided as part of a comprehensive pain management program       Procedure Plan:   1. Trigger Point Injection (will clear with Dr. [facility])     Regarding the above interventions, the patient has been educated regarding the risks (including bleeding, infection, increased pain, nerve damage, spinal cord injury, or allergic reaction), benefits, and alternatives. The patient states he/she understands and is eager to proceed.      Other:  Refer to OM for myofascial release      The supervising physician responsible for the care of this patient is Dr. Earlene Plater.  Dr. Earlene Plater is the attending physician in clinic who was immediately available.

## 2023-06-09 NOTE — Telephone Encounter (Signed)
Patient is calling complaining of symptoms. Patient is requesting medical advice.     Who is calling (name +ph#): Stephen [name] (PATIENT) [phone]    Procedure: NEPHROSTOMY TUBE    What are the symptoms? SUTURES HAVE FALLEN OUT FROM TUBE SITE, PT WOULD LIKE THEM REPLACED, IF POSSIBLE.    Pain level 0-10:  3-4    When did the symptoms start? NOTICED YESTERDAY DURING DRESSING CHANGE    Where is the pain located? CONCERN REGARDING SUTURES AT TUBE SITE    Is there bleeding? NO      *Patient was transferred to procedure scheduling team during call, to assist with scheduling tube change however patient also wanted to know if he could possibly be seen sometime today around 1, he is scheduled at [facility] and will be in the area. Patient advised I was not able to guarantee an appointment however would make team aware regarding matter and if we are able to accommodate, we most certainly will. -KE

## 2023-06-09 NOTE — Telephone Encounter (Signed)
Incoming call from Pt requesting to s/c PCN change reported symptoms     Who is calling (name +ph#): Patient      Procedure: PCN      What are the symptoms? SUTURES HAVE FALLEN OUT FROM TUBE SITE, PT WOULD LIKE THEM REPLACED, IF POSSIBLE. ASKING TO BE SEEN TODAY IF POSSIBLE.      Pain level 0-10:  3-4     When did the symptoms start? YESTERDAY DURING DRESSING CHANGE     Where is the pain located? CONCERN REGARDING SUTURES AT TUBE SITE     Is there bleeding? NO    Case has been escalated to PF to s/c for a sooner PCN change.

## 2023-06-09 NOTE — Telephone Encounter (Signed)
Outbound call to patient and LMOM to call clinic [phone]

## 2023-06-09 NOTE — Addendum Note (Signed)
Addended by: Myrtie Neither on: 06/09/2023 10:44 AM     Modules accepted: Orders

## 2023-06-09 NOTE — Telephone Encounter (Signed)
Called patient to confirm the following appointment.    Procedure:Nephrostomy tube change  Date:6/27  Check in Time:0800  Procedure Start time:0900  Location:[facility]  Prep:N/A-Local  Driver: N/A

## 2023-06-12 NOTE — Telephone Encounter (Signed)
See mychart message 6/20  Closing TE

## 2023-06-13 ENCOUNTER — Encounter (HOSPITAL_BASED_OUTPATIENT_CLINIC_OR_DEPARTMENT_OTHER): Payer: Self-pay | Admitting: Physician Assistant

## 2023-06-13 ENCOUNTER — Telehealth (HOSPITAL_BASED_OUTPATIENT_CLINIC_OR_DEPARTMENT_OTHER): Payer: Self-pay

## 2023-06-13 NOTE — Telephone Encounter (Signed)
[facility] / Medical Oncology Social Work Telephone note:    Stephen [name] called in for ongoing support.  We spoke for 10 minutes.  He was friendly and outgoing.  He said he's doing fairly well and outlined his current treatment plan.  He reported to be coping WNL.    He was out walking the dogs and trying to stay active but knows when to limit himself (like yard work).  He said he recently had his tubes pulled and has stiches.  He knows he needs to be careful.  I encouraged self care and reiterated that there will be plenty of time in the future for things like chores, gardening, etc.      Stephen [name] voiced no other specific social work needs.  He thanked me for the call.  Today was just a check in for general support.    Will follow as needed.    Kingsley Plan, LCSW  [facility] Social Worker  [phone]

## 2023-06-14 ENCOUNTER — Other Ambulatory Visit: Payer: Self-pay

## 2023-06-14 ENCOUNTER — Other Ambulatory Visit (HOSPITAL_BASED_OUTPATIENT_CLINIC_OR_DEPARTMENT_OTHER): Payer: Self-pay | Admitting: Family Medicine

## 2023-06-14 DIAGNOSIS — C7951 Secondary malignant neoplasm of bone: Secondary | ICD-10-CM

## 2023-06-14 DIAGNOSIS — G893 Neoplasm related pain (acute) (chronic): Secondary | ICD-10-CM

## 2023-06-15 ENCOUNTER — Ambulatory Visit
Admission: RE | Admit: 2023-06-15 | Discharge: 2023-06-15 | Disposition: A | Discharge status: Home / Self Care | Payer: Medicare Other | Source: Ambulatory Visit | Attending: Interventional Radiology | Admitting: Interventional Radiology

## 2023-06-15 ENCOUNTER — Encounter (HOSPITAL_BASED_OUTPATIENT_CLINIC_OR_DEPARTMENT_OTHER): Payer: Self-pay | Admitting: Interventional Radiology

## 2023-06-15 ENCOUNTER — Ambulatory Visit (HOSPITAL_BASED_OUTPATIENT_CLINIC_OR_DEPARTMENT_OTHER): Payer: Medicare Other

## 2023-06-15 ENCOUNTER — Other Ambulatory Visit: Payer: Self-pay

## 2023-06-15 ENCOUNTER — Encounter (HOSPITAL_BASED_OUTPATIENT_CLINIC_OR_DEPARTMENT_OTHER): Admission: RE | Payer: Self-pay | Source: Ambulatory Visit

## 2023-06-15 DIAGNOSIS — N179 Acute kidney failure, unspecified: Secondary | ICD-10-CM

## 2023-06-15 DIAGNOSIS — N1330 Unspecified hydronephrosis: Secondary | ICD-10-CM

## 2023-06-15 DIAGNOSIS — Z436 Encounter for attention to other artificial openings of urinary tract: Secondary | ICD-10-CM

## 2023-06-15 SURGERY — IR CHNG NEPHROSTOMY TUBE
Anesthesia: Local | Laterality: Left

## 2023-06-15 MED ORDER — LIDOCAINE HCL 2% EX GEL (UROJET)
Status: AC
Start: 2023-06-15 — End: 2023-06-15
  Filled 2023-06-15: qty 10

## 2023-06-15 MED ORDER — IODIXANOL 320 MG/ML IV SOLN
INTRAVENOUS | Status: DC | PRN
Start: 2023-06-15 — End: 2023-06-15
  Administered 2023-06-15: 5 mL

## 2023-06-15 MED ORDER — IODIXANOL 320 MG/ML IV SOLN
INTRAVENOUS | Status: AC
Start: 2023-06-15 — End: 2023-06-15
  Filled 2023-06-15: qty 50

## 2023-06-15 MED ORDER — LIDOCAINE HCL 1 % IJ SOLN
INTRAMUSCULAR | Status: AC
Start: 2023-06-15 — End: 2023-06-15
  Filled 2023-06-15: qty 10

## 2023-06-15 MED ORDER — HYDROMORPHONE HCL 2 MG OR TABS
2.0000 mg | ORAL_TABLET | ORAL | 0 refills | Status: DC | PRN
Start: 2023-06-15 — End: 2023-07-14
  Filled 2023-06-15: qty 60, 10d supply, fill #0

## 2023-06-15 MED ORDER — LIDOCAINE HCL 1 % IJ SOLN
INTRAMUSCULAR | Status: DC | PRN
Start: 2023-06-15 — End: 2023-06-15
  Administered 2023-06-15: 10 mL via INTRADERMAL

## 2023-06-15 SURGICAL SUPPLY — 12 items
BAG DRAINAGE NEPHROSTOMY 600ML (Misc Medical Supply) ×1
CATHETER DRAIN MULTIPURPOSE MAC-LOC 10.2FR X 25CM (Lines/Drains) ×1
DECANTER BAG-A-JET STERILE (Misc Medical Supply) ×1 IMPLANT
DRESSING SPONGE 2X2X4 PLY (Dressings/packing) ×1 IMPLANT
DRESSING SPONGE IV 2X2 STRL (Dressings/packing) ×1 IMPLANT
DRESSING TEGADERM HP 4X4 (Dressings/packing) ×1
DRESSING TEGADERM I.V. ADVANCED 4X4 1688 (Dressings/packing) ×1 IMPLANT
GLOVE BIOGEL PI ULTRATOUCH SIZE 7.5 (Gloves/Gowns) ×3 IMPLANT
GUIDEWIRE AMPLATZ SUPER STIFF 0.035" X 75CM, 7CM TAPER, STRAIGHT (Procedural wires/sheaths/catheters/balloons/dilators) ×1 IMPLANT
PROCEDURE PACK - IR NON-VASCULAR (Procedure Packs/kits) ×1 IMPLANT
SOLUTION IV 0.9% NS 500ML (Non-Pharmacy Meds/Solutions) ×1
SUTURE ETHILON 2-0 18" FS (Suture) ×1

## 2023-06-15 NOTE — Telephone Encounter (Signed)
[name] Progress Note    Received refill request for hydromorphone 2mg  PO q4h PRN pain.  Patient's last appointment with our team was on 05/29/23 and patient's next appointment with our team is on 06/26/23.  Patient has Narcan PRN on file.  Reviewed CURES, per CURES, last picked up pregabalin 100mg  #60 on 05/29/23, hydromorphone 2mg  #60 on 05/29/23, oxycodone IR 10mg  #90 on 05/23/23  .  No aberrancies noted.  Will send refill as requested.    Shea Thedora Rings, MD  Attending Physician  [facility]

## 2023-06-15 NOTE — Discharge Instructions (Signed)
General Discharge Instructions     PAIN MANAGEMENT     You may use over-the-counter acetaminophen (TYLENOL) for minor discomfort, if you are not otherwise restricted from using this medication or any other prescribed pain medication by the doctor that performed your procedure.     WHEN TO CALL YOUR PHYSICIAN      Fever (temperature > 100 F) and / or chills    Severe pain / chest pain    Bleeding, redness / drainage or new swelling around the incision site    Shortness of breath    Nausea or vomiting     Tubes/drains/lines:     If you have a drain or tube in place, no swimming, baths, or soaking in hot tubs.     MEDICATIONS     Please resume taking your regular medications, unless otherwise told by your doctor.     PHYSICIAN PERFORMING YOUR PROCEDURE: T. Brueckner - PA    CONTACT us:     During regular business hours, M-F 8:00 to 4:30, please call the IR Clinic at [phone].    After Hours: Please call the [place] [facility] Operator at [phone] and ask for the Interventional Radiologist On-Call.     Please call the [place] [facility] Operator at [phone] and ask for the Neurosurgery doctor On-Call (for Neuro patients)    Nephrostomy Tube Placement / Nephroureteral Tube Placement / Retrograde Nephroureteral Tube     We are pleased that you have chosen [facility] for your care today.  The procedure you had was a kidney tube placement.  A small flexible tube was placed into your kidney to drain your urine.       Pain Management   You received local anesthesia during your nephrostomy tube placement. As the anesthesia wears off, you may feel some pain and discomfort from your procedure. The site where your nephrostomy tube was placed may be sore.   You may use over the counter acetaminophen (Tylenol) for minor discomfort, if you are not otherwise restricted from using this medication or any other prescribed pain medication by your doctor that performed the procedure.      Activity   You may start taking  showers again 24 hours after your procedure. Shower as usual, but do not scrub or soak your tube site. Blot dry gently with a soft cloth.       Redress the site after your first shower. Once the initial dressing is changed, no dressing change is required for 7 days unless the dressing appears visibly soiled.       Avoid heavy lifting (more than 10 pounds) and strenuous physical activity for 7 days after your procedure.      Do not soak in tubs, hot tubs, or go swimming for 7-10 days or until your site heals.      Sedation / Anesthesia   You have been treated with a sedative, pain medication, or anesthetic that might make you sleepy, and have side effects that may not completely wear off for up to 24 hours. These side effects include:   Sleepiness   Confused thinking   Dizziness, loss of coordination   Nausea   Temporary alterations in memory or amnesia     If any of these side effects have not worn off after 24 hours, please contact your doctor's office or go to the nearest emergency room. For your safety, you must have a responsible adult help you go home.      Because of  the side effects of your anesthesia or medications, we strongly recommend that you:   Do not drive a car or operate machinery for 24 hours   Do not sign any legal documents, make any important personal or legal decisions for 24 hours   Take only the medications prescribed by your doctor   Do not drink alcohol for at least 24 hours      Tube care: Kidney tubes need to be exchanged every 3 months to prevent them from clogging.       Diet: You can eat your normal diet. If your stomach is upset, try bland, low-fat foods like plain rice, broiled chicken, toast, and yogurt. Drink plenty of fluids (unless your doctor tells you not to).      When to call / Contact information        Normal:     Bruising and small lump around site  Mild discomfort  Pink or red-tinged output in bag for the first 24-48 hours that clears up more each day     Urgent:     Call  your clinic or contact the hospital operator at [phone]       Fever (temperature > 100F) and/or chills  Severe pain / chest pain  Bleeding, redness / drainage or new swelling around the incision site  Shortness of breath  Nausea or vomiting  Bleeding that soaks through the bandage or doesn't stop with direct pressure     Emergency:     Call 911 Uncontrolled bleeding  Difficulty breathing       Contact Information:    Monday to Friday from 8:00 am to 4:30 pm:  Please call the IR clinic at: [phone].    Nights and weekends:   Call the hospital operator at [phone] and ask for the Interventional Radiologist on call.    Non-urgent questions:  Please send a message on My Chart.

## 2023-06-16 ENCOUNTER — Encounter (HOSPITAL_BASED_OUTPATIENT_CLINIC_OR_DEPARTMENT_OTHER): Payer: Self-pay | Admitting: Hospital

## 2023-06-18 NOTE — Progress Notes (Signed)
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  On evaluation, the patient reports that "I have a lot of anxiety".  He reports that he has overwhelmed by the burden of his condition reporting that he has in a lot of pain and he is presently receiving chemotherapy and radiation treatment for pain control.  He reports "I have pain all over, I can not relax".  He reports that he is unable to drive because of these load of opioid for pain control.  He reports that he uses local transport companies to get to his scheduled appointments.  At this time, he reports that he feels isolated and he has not able to get out, reporting that he is working with his treatment team for optimal pain control to improve on his quality of life.  The patient further notes that he has been struggling with early and middle insomnia reporting that he wakes up 2-3 times at nighttime to into his nephrostomy bag.  He reports "it drains a lot".  At this time, the patient reports that he is coping as best as he is able to under the circumstances.  At this time, he does not endorse any core neurovegetative symptoms of depression, including suicidality.  He reports that he is quite frustrated over findings of progressive disease despite active treatment.  At this time, he reports that he has been prescribed Cymbalta for pain and anxiety but he reports that "it is not touching the pain".  At this time, the patient is hopeful that radiation will provide some palliative relief.  At this time, he does not endorse any somatic anxiety including  spontaneous panic attacks.  He remains future oriented.    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  3) Inpatient Hospitalizations: The patient denies prior history of psychiatric hospitalizations.  4) Outpatient treatment/psychiatrist:  None   5) Medication Trials:  Cymbalta 60 mg p.o. b.i.d.; patient recalls recent dose titration but can not recall the effective date of the dose titration.  6)The patient is unable to answer questions regarding psychological trauma.    Family/Social/Substance History:  The patient reports that she lives with his sister in it dog.  He reports that he is not married.  He has no children.  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  He reports that he has 1 sister.  He denies any history of any blood relatives with any affective or psychotic illnesses.  He denies any tobacco alcohol illicit drug use.  He reports that he has had significant functional impairment over the past several months on account of progressive disease and pain.  Past Medical History:  Diagnoses:  Patient Active Problem List   Diagnosis    Post-traumatic stricture of anterior urethra    Bladder neck contracture    Lumbar radiculopathy    Cervical radiculopathy    Incomplete bladder emptying  Lumbosacral spondylosis without myelopathy    Chronic SI joint pain    Degenerative spondylolisthesis    Lumbar stenosis with neurogenic claudication    Pre-procedure lab exam    Cervical spondylosis without myelopathy    Cervical spondylosis    S/P lumbar fusion    Chronic left SI joint pain    Chronic midline thoracic back pain    Spondylolisthesis at L5-S1 level    Left renal mass    Malignant neoplasm of urinary bladder, unspecified site (CMS-HCC)    Dorsopathy    Ureter malignant neoplasm, left (CMS-HCC)    Cauda equina compression (CMS-HCC)    COVID-19 virus infection     Community acquired pneumonia    Acute urinary retention    HTN (hypertension)    Gout    MDD (major depressive disorder)    Urothelial cancer (CMS-HCC)    Urinary tract infection associated with indwelling urethral catheter (CMS-HCC)    Ureteral obstruction    Hydronephrosis    Urothelial carcinoma of kidney, left (CMS-HCC)    Myalgia, other site    Trigger point with back pain    Pre-op evaluation    Malignant neoplasm of overlapping sites of bladder (CMS-HCC)    Cancer associated pain    Rhabdomyolysis    Acute kidney injury superimposed on chronic kidney disease (CMS-HCC)    Hypotension    Altered mental status     Surgeries:  Past Surgical History:   Procedure Laterality Date    CT INSERTION OF SUPRAPUBIC CATH  09/25/2015    NEPHRECTOMY Right 1955    APPENDECTOMY      COLONOSCOPY      CYSTOSCOPY      CYSTOSCOPY W/ LASER LITHOTRIPSY      OTHER SURGICAL HISTORY      Interstim 01/29/2011    SPINE SURGERY  09/21    Lumbar-sacral fusion    TRANSURETHRAL RESECTION OF PROSTATE       Allergies:   Allergies   Allergen Reactions    Sulfa Drugs Unspecified, Hives and Other       Medications:   Current Outpatient Medications on File Prior to Visit   Medication Sig Dispense Refill    acetaminophen (TYLENOL) 500 MG tablet Take 2 tablets (1,000 mg) by mouth every 8 hours as needed for Mild Pain (Pain Score 1-3) or Moderate Pain (Pain Score 4-6). 40 tablet 0    albuterol (PROAIR HFA) 108 (90 Base) MCG/ACT inhaler ProAir HFA 90 mcg/actuation aerosol inhaler      allopurinol (ZYLOPRIM) 100 MG tablet Take 1 tablet (100 mg) by mouth daily. 30 tablet 0    Artificial Tear Solution (SOOTHE XP OP)       diclofenac (VOLTAREN) 1 % gel Apply 2 g topically every 6 hours as needed (Right knee pain). 100 g 0    DULoxetine (CYMBALTA) 20 MG CR capsule Take 1 capsule (20 mg) by mouth 2 times daily. 180 capsule 2    fluticasone propionate (FLONASE) 50 MCG/ACT nasal spray 2 sprays.      [DISCONTINUED] HYDROmorphone (DILAUDID) 2 MG tablet Take 1  tablet (2 mg) by mouth every 4 hours as needed for Moderate Pain (Pain Score 4-6) or Severe Pain (Pain Score 7-10). 60 tablet 0    hydrOXYzine HCL (ATARAX) 25 MG tablet Take 1 tablet (25 mg) by mouth every 8 hours as needed for Anxiety. 30 tablet 2    ibuprofen (MOTRIN) 600 MG tablet       ketotifen (ALAWAY) 0.025 %  ophthalmic solution Place 1 drop into both eyes 2 times daily.      loperamide (IMODIUM) 2 MG capsule For mild to moderate diarrhea (less than 5 BMs within 24 hours):  Take 1 capsule (2 mg) by mouth 4 times daily as needed for Diarrhea.  For severe Diarrhea (5 or more medium to large BMs within 24 hours):  take 2 capsules and then 1 capsule every 2 hours until diarrhea stops. 90 capsule 0    loratadine (CLARITIN REDITABS) 10 MG dissolvable tablet       Multiple Vitamins-Minerals (MULTIVITAMIN WITH MINERALS) TABS tablet Take 1 tablet by mouth daily. 30 tablet 1    naloxone (NARCAN) 4 mg/0.1 mL nasal spray For suspected opioid overdose, call 911! Then spray once in one nostril. Repeat after 3 minutes if no or minimal response using a new spray in other nostril. 2 each 0    Nutritional Supplements (NEPRO) LIQD Take 3 Cans by mouth daily. 30000 mL 11    polyethylene glycol (MIRALAX) 17 g packet Take 1 packet (17 g) by mouth 2 times daily as needed for Constipation. 1 each 0    pregabalin (LYRICA) 100 MG capsule Take 1 capsule (100 mg) by mouth 2 times daily. 60 capsule 0    prochlorperazine (COMPAZINE) 10 MG tablet Take 1 tablet (10 mg) by mouth every 6 hours as needed (Nausea/Vomiting). 30 tablet 5    senna (SENOKOT) 8.6 MG tablet Take 2 tablets (17.2 mg) by mouth 2 times daily. 30 tablet 0    tizanidine (ZANAFLEX) 2 MG tablet Take 1 tablet (2 mg) by mouth every 8 hours as needed (muscle spasm). 30 tablet 2     No current facility-administered medications on file prior to visit.     PSYCHIATRIC SPECIALTY EXAMINATION     Vital Signs:   There were no vitals taken for this visit.    Psychometric Scales:       01/20/2023     9:07 AM 03/03/2023    12:57 PM 04/06/2023     5:00 PM 04/27/2023     8:14 PM 05/11/2023    11:00 AM 06/01/2023    11:30 AM 06/05/2023     8:37 AM   [place] PHQ9 DEPRESSION QUESTIONNAIRE   Interest 1 0 0 0 0 2 0   Depressed 1 0 0 0 0 1 0          06/05/2023     8:36 AM   GAD 7   1. Feeling nervous, anxious or on edge 1   2. Not being able to stop or control worrying 1   3. Worrying too much about different things 1   4. Trouble relaxing 1   5. Being so restless that it is hard to sit still 0   6. Being easily annoyed or irritable 0   7. Feeling afraid as if something awful might happen 0   GAD7 Patient Total 4   If you checked off any problems, how difficult have these problems made it for you to do your job along with other people? Not difficult at all           No data to display                Pertinent Studies/Labs:    Latest Reference Range & Units 06/01/23 07:48   Sodium 136 - 145 mmol/L 140   Potassium 3.5 - 5.1 mmol/L 4.3   Chloride 98 - 107 mmol/L 100   Bicarbonate  22 - 29 mmol/L 29   Anion Gap 7 - 15 mmol/L 11   BUN 8 - 23 mg/dL 30 (H)   Creatinine 5.40 - 1.17 mg/dL 9.81   eGFR Based on CKD-EPI 2021 Equation mL/min/1.73 m2 >60   Glucose 70 - 99 mg/dL 191 (H)   Calcium 8.5 - 10.6 mg/dL 9.4   Alkaline Phos 40 - 129 U/L 95   ALT (SGPT) 0 - 41 U/L 13   AST (SGOT) 0 - 40 U/L 22   Bilirubin, Tot <1.2 mg/dL 4.78   Albumin 3.5 - 5.2 g/dL 3.8   (H): Data is abnormally high    Mental Status Examination:   Appearance:  Appropriately groomed for the season, ambulating with a walker, cooperative, wincing in pain frustrated with no evidence of psychomotor retardation.    Behavior:  Help seeking, able to establish a rapport  Motor/Abnormal Involuntary Movements: No abnormal or involuntary movements  Gait: not assessed  Speech: understandable/clear, coherent/meaningful, normal rate, and normal volume  Mood: "Have pain... all over all the time"   Affect:  Mood congruent, broad range of affect   Thought Process: Coherent,  logical  Associations: linear and goal-directed  Thought Content:  At this time, the patient does not endorse any core neurovegetative symptoms of depression, including suicidality.  He reports that he is overwhelm with chronic, recurring cancer pain.  He reports that he is frequently tense and anxious and keyed up and on edge unable to relax due to pain.  At this time, it was not endorse any disrupted sleep or somatic anxiety, including bouts of pawn spontaneous panic attacks.  He reports that he does stay asleep unless he he is changing his nephrostomy bag.  At this time, he reports that his back has been draining a lot.  He reports that he is most focused on a remedy to the pain causing anxiety, hopeful that radiation treatment will provide some relief.  At this time, he remains future oriented.  He does not endorse any suicidal ideation, plans or intent.  He has compliant with the Cymbalta as prescribed.  Although he reports that he is not experiencing any benefit from anxiety relief as the dose was titrated.  Perceptions: no abnl  Insight/Judgment:  Good/good   Orientation: AAO x4   Memory: recent and remote memory grossly intact   Attention/Concentration: no gross abnormalities   Language: Average based on verbal fluency and interaction   Fund of knowledge and Intellect: Average based on interview and verbal fluency    MEDICAL DECISION MAKING     Assessment:  Harjap Todorovich is a 72 year old male with OA, HTN, MDD with L UTUC with metastatic disease ([id number]), with osseous mets (Pet/CT with diffuse enhancement and discrete lesions) who presents to clinic for a psychiatric intake appointment, reporting he can not pain burden and related anxiety in the face of a guarded prognosis and progressive disease.      Patient Strengths: Seeking treatment voluntarily, Supportive/involved family/friends, Well connected to community/professional resources, History of independent functioning, Accepts need for  medication/therapeutic interventions, Intact cognition, Has insight into illness, Stable vocational history, History of treatment adherence, Stable living arrangements, and Average intelligence     A comprehensive suicide risk assessment was performed and the patient was assessed to be at a low acute risk of self-harm.  Modifiable risk factors include precipitating stressors and severe medical illness.  Non-modifiable risk factors include existing psychiatric diagnoses, male gender, and older age.  The patient also  has protective factors of future life plans, coping skills, and access to health care.      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 Adjustment disorder, with anxious mood  3. Urothelial cancer      Plan  At this time, the patient was educated about his premorbid history and his presenting predicament.  The patient responded well to psychoeducation about the comorbidity of chronic pain and mood and anxiety burden.  At this time, he is instructed to continue Cymbalta 60 mg p.o. b.i.d..  We will target his residual anxiety with a trial of BuSpar 10mg  p.o. daily x7 days and titrate BuSpar 10 mg p.o. b.i.d. daily thereafter.  At this time, the patient has responded well to psychoeducation about indication for adjunctive psychotherapy.  In the interim, he will continue routine follow-up with the palliative care team for optimal pain control.  He is encouraged to add structure to his daily routine.  He patient S will be changed to work with SW to tap into local resources to meet his needs, including other psychosocial support where indicated.  The patient will return to clinic for follow-up as scheduled.    Labs/Tests/Consultation:  None  Referrals:  None    Additional Planning:  - Provided psychoeducation, supportive therapy and empathic listening today.  - Discussed suicide and emergency room precautions.  - Discussed risks/benefits/alternatives to  medication, patient voiced understanding.  - CURES review on 06/05/23 reveals no suspicious activity.      Disposition: Return visit two weeks    Tommy Rainwater, MD  Staff Psychiatrist  [facility]

## 2023-06-19 ENCOUNTER — Encounter (HOSPITAL_BASED_OUTPATIENT_CLINIC_OR_DEPARTMENT_OTHER): Payer: Self-pay | Admitting: Family Medicine

## 2023-06-19 ENCOUNTER — Other Ambulatory Visit: Payer: Self-pay

## 2023-06-19 DIAGNOSIS — F40240 Claustrophobia: Secondary | ICD-10-CM

## 2023-06-19 MED ORDER — LORAZEPAM 0.5 MG OR TABS
ORAL_TABLET | ORAL | 0 refills | Status: DC
Start: 2023-06-19 — End: 2023-07-14
  Filled 2023-06-19: qty 2, 1d supply, fill #0

## 2023-06-19 NOTE — Telephone Encounter (Signed)
From: [name]  To: Margarita Mail, MD  Sent: 06/19/2023 10:00 AM PDT  Subject: MRI pre med    I think you we ordered an MRI please order a pre medication for if you can before the fourth/MR#[id number]/ thank you

## 2023-06-20 ENCOUNTER — Other Ambulatory Visit: Payer: Self-pay

## 2023-06-20 ENCOUNTER — Ambulatory Visit: Payer: Medicare Other | Admitting: Speech-Language Pathologist

## 2023-06-20 VITALS — BP 126/67 | HR 59 | Temp 97.8°F | Resp 16 | Ht 66.0 in | Wt 156.0 lb

## 2023-06-20 DIAGNOSIS — R1312 Dysphagia, oropharyngeal phase: Secondary | ICD-10-CM | POA: Insufficient documentation

## 2023-06-20 MED ORDER — LIDOCAINE ATOMIZER SYRINGE 4% SOLUTION (~~LOC~~)
Status: AC
Start: 2023-06-20 — End: 2023-06-20
  Filled 2023-06-20: qty 0.5

## 2023-06-20 MED ORDER — PHENYLEPHRINE ATOMIZER SYRINGE 0.25% SOLUTION
Status: AC
Start: 2023-06-20 — End: 2023-06-20
  Filled 2023-06-20: qty 0.5

## 2023-06-20 NOTE — Interdisciplinary (Signed)
RN did not assess patient this visit. Allergies reviewed.  Medications removed:    - 1 syringe of Lidocaine 4% solution 0.5 mL/syringe  - 1 syringe of Phenylephrine 0.25 % solution 0.5 mL/syringe    Medications labeled and given to Liza Blumenfeld SLP and scoped the patient.

## 2023-06-20 NOTE — Interdisciplinary (Signed)
[facility]     [facility]  Division of Head and Neck Surgery      SPEECH PATHOLOGY TREATMENT NOTE (CPT [phone])    Chief Complaint: Dysphagia    Subjective  Patient last seen 05/09/23. Since last session, he has continued with infusions and will undergo imaging to explore pain/lesions in his back. He remains on a soft diet, largely due to dentition.      Pain: 6/10 Back  Weight: 156 pounds (+1)    Patient Questionnaires  Voice Handicap Index (VHI-10) = DNC  Eating Tool Assessment (EAT-10) = DNC  PILL-5 = DNC  MDADI: Global:DNC    Composite:DNC         Functional Oral Intake Scale (FOIS)    A. Tube Dependent (Levels 1-3)  No oral intake  Tube dependent with minimal/inconsistent oral intake  Tube supplements with consistent oral intake of food/liquid    B. Total Oral Intake (Levels 4-7)  4.   Total oral intake of a single consistency  5.   Total oral intake of multiple consistencies requiring special preparation  6.   Total oral intake with no special preparation, but must avoid specific foods or liquid items  7.   Total oral intake with no restrictions    PATIENT SCORE: 5    PERFORMANCE STATUS SCALE - HN    Eating in Public: 50  100   No restriction of place, food, or companion (eats out at any opportunity)  75     No restriction of place, but restricts diet when in public (eats anywhere, but may limit intake to less "messy foods")  50     Eats only in presence of selected persons in selected places  25     Eats only at home in presence of selected persons  0       Always eats alone    Understandability in Speech: 100  100   Always understandable  75     Understandable most of the time; Occasional repetition necessary  50     Usually understandable; Face-to-face contact necessary  25     Difficult to understand  0       Never understandable; May use written communication    Normalcy of Diet: 50  100   Full diet (no restrictions)  90     Full diet (liquid assist)  80     All meat  70     Raw  carrots, celery  60     Dry bread and crackers  50     Soft chewable foods (e.g., macaroni, canned/soft fruits, cooked vegetables, fish, hamburger, small pieces of meat)  40     Soft foods requiring no chewing (e.g., mashed potatoes, apple sauce, pudding)  30     Pureed foods (in blender)  20     Warm liquids  10     Cold liquids  0       Nonoral feeding (tube fed)     Cancer History  Oncology History   Ureter malignant neoplasm, left (CMS-HCC)   10/26/2021 Initial Diagnosis    Ureter malignant neoplasm, left (CMS-HCC)     12/01/2022 Cancer Staged    Staging form: Renal Pelvis And Ureter, AJCC 8th Edition  - Clinical: Stage IV (cTX, cN0, pM1) - Signed by Cheri Guppy, MD on 12/01/2022         Objective  Mr. 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 Decongestant and lidocane were applied. Velopharyngeal closure was diminished on the L. (See photo). Structures of the tongue base, epiglottis, arytenoids, pyriform sinuses and post-cricoid region were [facility]. Vocal fold appearance was intact with adequate movement. Secretions were fairly managed.  Murray Secretion Scale (MSS) = 1. Base of tongue retraction was Mindly reduced during a back-vowel production. Pharyngeal squeeze was [facility] during a high-pitched vocalization.     Murray Secretion Scale (MSS)   0 = Thin clear secretions  1 = Pooling outside laryngeal vestibule  2 = Pooling in laryngeal vestibule transiently, spills in over observation period or patient clears them  3 = Pooling in laryngeal vestibule consistently and patient does not clear them    PO administered: Thin (water via cup),  puree (applesauce), mixed/soft (fruit cup)   Oral containment/control of bolus: Patient able to masticate, form, and transfer.   Swallow timing: Timely   Pharyngeal residue: Vallecular  score = III  Pyriform score = III   Sensation / Response to residue:  [facility]   Airway protection: Min decreased   Penetration: Yes with puree texture(s) during the swallow.    Sensory response:  diminished   Aspiration: None    Penetration-Aspiration Scale: 3   Compensatory strategies: Double swallow   Biofeedback:  Provided     Pharyngeal residue ratings based on Yale Pharyngeal Residue Severity Rating Scale (I - None, II - Trace, III - Mild, IV - Moderate, V - Severe)    PAS Scoring  1 - Does not enter airway  2 - Enters airway, remains above vocal folds, no residue  3 - Remains above vocal folds, visible residual remains  4 - Contacts vocal folds, no residue remains  5 - Contacts vocal folds, visible residue remains  6 - Passes glottis, no subglottic residue visible  7 - Passes glottis, visible subglottic residual despite sensory response  8 - Passes glottis, visible subglottic residual, absent sensory response      Diagnostic Swallow Therapy: (CPT [phone])    Education Provided patient education re: results of testing that highlight utility of double swallow and superficial penetration.  Clinical s/s of aspiration and dysphagia were discussed.   Instruction/Observations Oral Opening, measured by [employer] Tool = 34 mm  PO Trials = See FEES  Provided safe swallowing strategies and compensatory strategies for PO intake including: double swallow   Exercise(s) Provided demonstration and instruction re: all tasks       Assessment  Patient presents with Mild oropharyngeal dysphagia associated with mild reduction in base of tongue retraction and airway protection. Compensatory strategies (effortful swallow, double swallow) were helpful. Swallow function is confounded by poor dental status.    Plan  Return 6 weeks  Diet Recommendations: Foods = Easy to chew - 7, Drinks = Thins - 0  Compensatory Strategies: Double swallow, effortful swallow  Return for therapy (92526/[id number]) q 2 weeks         Goals Addressed                    This Visit's Progress     Tolerate specified oral intake of foods and liquids with stable pulmonary function and absence of signs/symptoms of aspiration during skilled meal/oral gratification trial.   On track     Tolerate 100% oral intake consisting of puree) foods  and thin liquids with stable pulmonary function and absence of signs/symptoms of aspiration during skilled meal/oral gratification trial over 6 weeks       Verbalize and implement dysphagia compensatory strategies to ensure safe and efficient swallowing.   On track                Benita Gutter, MA, CCC-SLP, BCS-S  Speech Pathologist

## 2023-06-22 ENCOUNTER — Other Ambulatory Visit: Payer: Self-pay

## 2023-06-23 ENCOUNTER — Other Ambulatory Visit: Payer: Self-pay

## 2023-06-23 ENCOUNTER — Ambulatory Visit
Admission: RE | Admit: 2023-06-23 | Discharge: 2023-06-23 | Disposition: A | Discharge status: Home / Self Care | Payer: Medicare Other | Attending: Family Medicine | Admitting: Family Medicine

## 2023-06-23 DIAGNOSIS — C7951 Secondary malignant neoplasm of bone: Secondary | ICD-10-CM | POA: Insufficient documentation

## 2023-06-23 DIAGNOSIS — M2578 Osteophyte, vertebrae: Secondary | ICD-10-CM

## 2023-06-23 DIAGNOSIS — M5412 Radiculopathy, cervical region: Secondary | ICD-10-CM | POA: Insufficient documentation

## 2023-06-23 DIAGNOSIS — G893 Neoplasm related pain (acute) (chronic): Secondary | ICD-10-CM | POA: Insufficient documentation

## 2023-06-23 DIAGNOSIS — M47812 Spondylosis without myelopathy or radiculopathy, cervical region: Secondary | ICD-10-CM | POA: Insufficient documentation

## 2023-06-23 DIAGNOSIS — M4802 Spinal stenosis, cervical region: Secondary | ICD-10-CM

## 2023-06-23 DIAGNOSIS — C7910 Secondary malignant neoplasm of unspecified urinary organs: Secondary | ICD-10-CM | POA: Insufficient documentation

## 2023-06-23 MED ORDER — GADOBUTROL 1 MMOL/ML IV SOLN (WRAPPED RECORD)
7.0000 mL | Freq: Once | INTRAVENOUS | Status: AC
Start: 2023-06-23 — End: 2023-06-23
  Administered 2023-06-23: 7 mL via INTRAVENOUS
  Filled 2023-06-23: qty 7

## 2023-06-26 ENCOUNTER — Ambulatory Visit: Payer: Medicare Other | Attending: Family Medicine | Admitting: Family Medicine

## 2023-06-26 ENCOUNTER — Telehealth (HOSPITAL_BASED_OUTPATIENT_CLINIC_OR_DEPARTMENT_OTHER): Payer: Self-pay

## 2023-06-26 VITALS — BP 94/52 | HR 56 | Temp 96.9°F | Resp 16 | Ht 66.0 in | Wt 160.4 lb

## 2023-06-26 DIAGNOSIS — R1310 Dysphagia, unspecified: Secondary | ICD-10-CM | POA: Insufficient documentation

## 2023-06-26 DIAGNOSIS — T402X5A Adverse effect of other opioids, initial encounter: Secondary | ICD-10-CM | POA: Insufficient documentation

## 2023-06-26 DIAGNOSIS — C7910 Secondary malignant neoplasm of unspecified urinary organs: Secondary | ICD-10-CM | POA: Insufficient documentation

## 2023-06-26 DIAGNOSIS — G4700 Insomnia, unspecified: Secondary | ICD-10-CM | POA: Insufficient documentation

## 2023-06-26 DIAGNOSIS — C7951 Secondary malignant neoplasm of bone: Secondary | ICD-10-CM | POA: Insufficient documentation

## 2023-06-26 DIAGNOSIS — M5416 Radiculopathy, lumbar region: Secondary | ICD-10-CM | POA: Insufficient documentation

## 2023-06-26 DIAGNOSIS — M5412 Radiculopathy, cervical region: Secondary | ICD-10-CM | POA: Insufficient documentation

## 2023-06-26 DIAGNOSIS — R5383 Other fatigue: Secondary | ICD-10-CM | POA: Insufficient documentation

## 2023-06-26 DIAGNOSIS — R110 Nausea: Secondary | ICD-10-CM | POA: Insufficient documentation

## 2023-06-26 DIAGNOSIS — Z515 Encounter for palliative care: Secondary | ICD-10-CM | POA: Insufficient documentation

## 2023-06-26 DIAGNOSIS — R296 Repeated falls: Secondary | ICD-10-CM | POA: Insufficient documentation

## 2023-06-26 DIAGNOSIS — F064 Anxiety disorder due to known physiological condition: Secondary | ICD-10-CM | POA: Insufficient documentation

## 2023-06-26 DIAGNOSIS — M47812 Spondylosis without myelopathy or radiculopathy, cervical region: Secondary | ICD-10-CM | POA: Insufficient documentation

## 2023-06-26 DIAGNOSIS — G893 Neoplasm related pain (acute) (chronic): Secondary | ICD-10-CM | POA: Insufficient documentation

## 2023-06-26 DIAGNOSIS — K5903 Drug induced constipation: Secondary | ICD-10-CM | POA: Insufficient documentation

## 2023-06-26 DIAGNOSIS — C662 Malignant neoplasm of left ureter: Secondary | ICD-10-CM

## 2023-06-26 DIAGNOSIS — R413 Other amnesia: Secondary | ICD-10-CM | POA: Insufficient documentation

## 2023-06-26 NOTE — Progress Notes (Signed)
[facility] Service Outpatient Progress Note    Requested by Dr. Brock Ra    CC: Pain, psychosocial support    ID: Stephen [name] is a 72 year old male with a history of metastatic urothelial carcinoma, OA, HTN, MDD, gout referred to Palliative Care clinic for symptom management and psychosocial support.    Interval Events:  -Stephen [name] had an appointment with Pain Management on 06/09/23.  Per review of note, planned for trigger point injections and referred to OM for myofascial release  -Stephen [name] had a follow up with Psychiatry on 06/05/23.  Per review of note, was started on Buspar 10mg  PO daily with plan to titrate up to Buspar 10mg  PO BID after 1 week.  Counseled to continue duloxetine 20mg  PO BID  -Stephen [name] had a follow up with Oncology on 06/01/23, with plan for enfortumab with dose reduction and continuing pembrolizumab.  Stephen [name] has an upcoming appointment with Orthopedics on 07/03/23  Stephen [name] underwent IR nephrostomy tube exchange on 06/15/23    Subjective:    Today we met with patient in palliative care clinic.  We addressed several issues during their visit:     #) Pain: See description below.   Location: Low back; feels like shoulder pain has improved; teeth/gums; also feels pain in his thighs  Quality: Burning (legs), tightness/pressure  Severity: Moderate  Duration/Timing: constant  Modifying factors       Aggravating factors: sitting down, activities       Alleviating factors: tizanidine; patient otherwise has difficulty telling me which medications seem to help him and not help him    -Mentions he wants to get XRT but has been having difficulty scheduling it.  Per review of chart has been lost to follow up in the past.  Will reach out to Radiation Oncology for patient  -Taking hydromorphone 2mg  PO q4h PRN pain, but unsure how often he is taking it  Sister managing his medications.  -States he does not take hydromorphone every day  -Taking pregabalin 100mg  PO, not sure if taking once a day or BID as prescribed, states  his sister organizes his medications  -Taking duloxetine CR 20mg  PO, not sure if taking once a day or BID as prescribed, sister organizes his medications  -Stopped getting PT, open to possibly getting it again through [facility] although expresses reluctance today mentioning that he already has so many appointments  -Taking tizanidine 2mg  PO q8h PRN muscle spasms, does not take every day  -Taking diclofenac 1% topical QID PRN pain which still is helpful, only uses from time to time  -Also has followed with Pain Management, planned for PT soon  -Denies any adverse effects of the hydromorphone, pregabalin, duloxetine or tizanidine such as sedation, lethargy, myoclonus or pruritus.  However, mentions that he has been having falls recently.  -Per CURES: lorazepam 0.5mg  #2 tablets on 06/20/23, hydromorphone 2mg  #60 on 06/20/23, pregabalin 100mg  #60 on 05/29/23, hydromorphone 2mg  #60 on 05/29/23   -MRI cervical spine has been performed but is pending read    #) Bowel movements:  -Reports having some constipation today  -Thinks constipation gets worse with chemotherapy, but is manageable when he is not getting constipation  -Taking Senna-S 50mg -8.6mg  PO BID per his medication list although his sister manages his medications and he is unsure what he is taking  -Takes Miralax 17gm PO daily PRN constipation and docusate PRN for constipation per previous discussions, will reach out to sister to clarify medication list  -Having regular BM    #)  Nausea:  -Takes prochlorperazine 10mg  PO q6h PRN for nausea, still helpful when he needs it  -Denies nausea/vomiting today    #) Nutrition and dysphagia:  -Has maintained weight since last visit  -Still eating soft foods, has difficulty with swallowing which he follows with Speech Therapy for.  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  -Offered to order wheelchair for patient but he adamantly declines this.  Expressed concern that he has been falling frequently.  Will reach out to his sister to discuss    #) Dyspnea on exertion:  -No dyspnea on exertion today  -Denies any fever, chills, coughing, wheezing, hemoptysis today    #) Nephrostomy tube:  -Had nephrostomy tube exchange on 06/15/23  -No fevers, chills, abdominal pain, flank pain reported today  -Urine has been clear without any hematuria or abnormal color/odor per patient    #) Sinusitis:  -Reports postnasal drip, nasal congestion chronically (for at least the past year), has not changed since last visit  -Feels like his R sinus feels plugged up chronically  -Denies fever, chills, sinus pain, pharyngitis  -Does notice coughing from time to time  -Mentions that his PCP recently retired from clinical medicine, was referred to a new PCP (Dr. Burman Riis), unclear if he has established with her yet or not.  Has been advised to see PCP regarding his sinusitis    #) Anxiety:  -Taking Buspar 10mg  PO, not sure if taking 1 or 2 times a day right now (was prescribed by Psychiatry, sister manages his medications)  -Stopped taking hydroxyzine since last visit per instructions of other providers  -Will avoid stronger anxiolytics such as lorazepam given hx of memory problems and hx of possible opioid overdose unless assessed to be indicated per Psychiatry    #) Urothelial carcinoma:  -Planned to continue on EV and pembrolizumab.  Recent PET/CT and MRI lumbar spine with pathologic fracture of left iliac wing, worsening osseous metastatic disease.      #) Possible left inguinal hernia:  -Plans to see Surgery on 06/30/23  for evaluation    #) Hypotension:  -Noted to have low BP today but does not report any dizziness, lightheadedness, chest pain, palpitations, PND, orthopnea or syncope.  States this is a chronic problem and his BP is always low.  Reports eating/drinking adequately    #) Recurrent falls:  -Mentions having a few falls recently at ground level.  Has skin tear on bilateral arms relating to this fall.  States this is occurring because his R leg "gives out" due to chronic nerve problems.      #) Psychosocial domain:  -Using medical transportation to get to appointments  -Walking with a walker, adamantly declines a wheelchair or PT or home evaluation today  -Enjoys taking his sister's dog for walks  -Enjoys going to the beach    #) Advance care planning:  -Health care agent: Colman Cater (sister)    ROS: Symptom Assessment      06/26/2023     8:00 AM 06/08/2023  12:01 PM 06/01/2023     1:47 PM 05/18/2023    10:44 AM 05/11/2023    11:05 AM 05/11/2023     8:11 AM 04/27/2023     6:00 PM   Palliative Care Symptom Assessment   Pain Level 5         Constipation 5         Tiredness 3         Nausea 0         Depression 9         Anxiety 9         Drowsiness 6         Appetite 4         Wellbeing 8         Dyspnea 0         Insomnia 3         ECOG  2 2 2 2  0    Palliative performance scale        50       Additional ROS reviewed in detail and negative:  Review of Systems   Constitutional:  Positive for appetite change, fatigue and unexpected weight change. Negative for chills, diaphoresis and fever.   HENT:   Positive for trouble swallowing. Negative for sore throat and voice change.         +Nasal congestion  +Post-nasal drip   Eyes:  Negative for icterus.   Respiratory:  Positive for cough. Negative for chest tightness, hemoptysis, shortness of breath and wheezing.    Cardiovascular:  Negative for chest pain, leg swelling and palpitations.   Gastrointestinal:  Positive for constipation. Negative for abdominal pain, blood in stool,  diarrhea, nausea and vomiting.   Genitourinary:  Positive for difficulty urinating. Negative for dysuria, frequency and hematuria.    Musculoskeletal:  Positive for arthralgias, back pain, gait problem and neck pain.   Skin:  Negative for rash.   Neurological:  Positive for extremity weakness and gait problem. Negative for light-headedness and speech difficulty.   Psychiatric/Behavioral:  Positive for sleep disturbance. Negative for suicidal ideas. The patient is nervous/anxious.         Outpatient Palliative Regimen:  Acetaminophen 1000mg  PO q8h PRN pain   Duloxetine CR 20mg  PO BID  Prochlorperazine 10mg  PO q6h PRN nausea  Trazodone 50mg  PO nightly   Pregabalin 100mg  PO BID   Miralax 17gm PO BID (takes only as needed)  Senokot 1 tablet PO BID  Lidocaine 4% patch 12 hours on / 12 hours off  Diclofenac 1% topical QID PRN pain  Narcan PRN  Hydroxyzine 25mg  PO q8h PRN anxiety  Ibuprofen 600mg  PRN  Tizanidine 2mg  PO q8h PRN muscle spasms    Conversion to Oral Morphine Equivalent = Unknown, will have to reach out to patient's sister to discuss    Allergies & Reactions: Review of patient's allergies indicates Sulfa drugs    Medications: (reviewed today)  Current Outpatient Medications on File Prior to Visit   Medication Sig Dispense Refill    acetaminophen (TYLENOL) 500 MG tablet Take 2 tablets (1,000 mg) by mouth every 8 hours as needed for Mild Pain (Pain Score 1-3) or Moderate Pain (Pain Score 4-6). 40 tablet 0    albuterol (PROAIR HFA) 108 (90 Base) MCG/ACT inhaler ProAir HFA 90 mcg/actuation aerosol inhaler      allopurinol (ZYLOPRIM) 100 MG tablet Take 1 tablet (100 mg) by mouth daily. 30 tablet 0    Artificial Tear Solution (SOOTHE XP OP)  busPIRone (BUSPAR) 10 MG tablet One tab po daily for 5 days, then one tab po bid 60 tablet 0    diclofenac (VOLTAREN) 1 % gel Apply 2 g topically every 6 hours as needed (Right knee pain). 100 g 0    DULoxetine (CYMBALTA) 20 MG CR capsule Take 1 capsule (20 mg) by mouth 2  times daily. 180 capsule 2    fluticasone propionate (FLONASE) 50 MCG/ACT nasal spray 2 sprays.      HYDROmorphone (DILAUDID) 2 MG tablet Take 1 tablet (2 mg) by mouth every 4 hours as needed for Moderate Pain (Pain Score 4-6) or Severe Pain (Pain Score 7-10). 60 tablet 0    hydrOXYzine HCL (ATARAX) 25 MG tablet Take 1 tablet (25 mg) by mouth every 8 hours as needed for Anxiety. 30 tablet 2    ibuprofen (MOTRIN) 600 MG tablet       ketotifen (ALAWAY) 0.025 % ophthalmic solution Place 1 drop into both eyes 2 times daily.      loperamide (IMODIUM) 2 MG capsule For mild to moderate diarrhea (less than 5 BMs within 24 hours):  Take 1 capsule (2 mg) by mouth 4 times daily as needed for Diarrhea.  For severe Diarrhea (5 or more medium to large BMs within 24 hours):  take 2 capsules and then 1 capsule every 2 hours until diarrhea stops. 90 capsule 0    loratadine (CLARITIN REDITABS) 10 MG dissolvable tablet       LORazepam (ATIVAN) 0.5 MG tablet Take 1 tablet by mouth 30 to 60 minutes prior to MRI.  May repeat dose once if needed. 2 tablet 0    Multiple Vitamins-Minerals (MULTIVITAMIN WITH MINERALS) TABS tablet Take 1 tablet by mouth daily. 30 tablet 1    naloxone (NARCAN) 4 mg/0.1 mL nasal spray For suspected opioid overdose, call 911! Then spray once in one nostril. Repeat after 3 minutes if no or minimal response using a new spray in other nostril. 2 each 0    Nutritional Supplements (NEPRO) LIQD Take 3 Cans by mouth daily. 30000 mL 11    polyethylene glycol (MIRALAX) 17 g packet Take 1 packet (17 g) by mouth 2 times daily as needed for Constipation. 1 each 0    pregabalin (LYRICA) 100 MG capsule Take 1 capsule (100 mg) by mouth 2 times daily. 60 capsule 0    prochlorperazine (COMPAZINE) 10 MG tablet Take 1 tablet (10 mg) by mouth every 6 hours as needed (Nausea/Vomiting). 30 tablet 5    senna (SENOKOT) 8.6 MG tablet Take 2 tablets (17.2 mg) by mouth 2 times daily. 30 tablet 0    tizanidine (ZANAFLEX) 2 MG tablet Take 1  tablet (2 mg) by mouth every 8 hours as needed (muscle spasm). 30 tablet 2    traZODone (DESYREL) 50 MG tablet Take one tab nightly by mouth at bedtime as needed for insomnia 90 tablet 0     No current facility-administered medications on file prior to visit.       Past Medical History:  Past Medical History:   Diagnosis Date    Chronic back pain     Congenital hydronephrosis     Gout     Headache     Hematuria     HTN (hypertension) 12/10/2021    Kidney disease     Kidney stones     Major depressive disorder, single episode     Polyarthropathy or polyarthritis of multiple sites     Retinal detachment  Urethral stricture        Past Surgical History:  Past Surgical History:   Procedure Laterality Date    CT INSERTION OF SUPRAPUBIC CATH  09/25/2015    NEPHRECTOMY Right 1955    APPENDECTOMY      COLONOSCOPY      CYSTOSCOPY      CYSTOSCOPY W/ LASER LITHOTRIPSY      OTHER SURGICAL HISTORY      Interstim 01/29/2011    SPINE SURGERY  09/21    Lumbar-sacral fusion    TRANSURETHRAL RESECTION OF PROSTATE         Social History:  Healthcare agent/surrogate decision-maker: Colman Cater (sister)  Social History     Social History Narrative    Not on file        Family History:  Family History   Adopted: Yes   Family history unknown: Yes       PEX:  Vitals:    06/26/23 0802   BP: (!) 94/52   BP Location: Left arm   BP Patient Position: Sitting   BP cuff size: Regular   Pulse: 56   Resp: 16   Temp: 96.9 F (36.1 C)   TempSrc: Temporal   SpO2: 99%   Weight: 72.8 kg (160 lb 6.4 oz)   Height: 5\' 6"  (1.676 m)           General: No acute distress, thin, sitting in chair  HEENT: PERRLA, EOMI, no conjunctivitis or scleral icterus noted.   Nares patent, +swollen turbinates noted.  +Post-nasal drip noted.  No sinus pain with palpation.  Neck:  Trachea midline.  Lungs: Clear to auscultation bilaterally, no wheezes, no rhonchi, no rales  CV: Regular rate and rhythm, no murmurs, no rubs, no gallops  Abdomen: Soft, nondistended, nontender  to palpation, no rebound or guarding, normoactive bowel sounds in all four quadrants, no CVAT, +nephrostomy tube noted with clear urine in bag on L side  Neuro: Oriented x3, no dysarthria, moving all four extremities spontaneously, 5/5 strength throughout.  Continues to appear to have memory issues at times (unable to tell me how often and when he is taking certain medications, reports his sister manages this)  Psych: Thought process linear and goal-directed, thought content appropriate, affect full range and appropriate, denies suicidal ideation.  No evidence of hallucinations or delusions  Skin: Warm and well perfused.  Brisk capillary refill.      Pertinent Labs:     Lab Results   Component Value Date    WBC 6.7 06/08/2023    RBC 3.41 (L) 06/08/2023    HGB 10.1 (L) 06/08/2023    HCT 30.9 (L) 06/08/2023    MCV 90.6 06/08/2023    MCHC 32.7 06/08/2023    RDW 17.0 (H) 06/08/2023    PLT 226 06/08/2023    MPV 10.1 06/08/2023       Lab Results   Component Value Date    BUN 27 (H) 06/08/2023    CREAT 1.19 (H) 06/08/2023    CL 102 06/08/2023    NA 141 06/08/2023    K 4.4 06/08/2023    [place] 9.7 06/08/2023    TBILI 0.64 06/08/2023    ALB 4.2 06/08/2023    TP 6.9 06/08/2023    AST 20 06/08/2023    ALK 100 06/08/2023    BICARB 31 (H) 06/08/2023    ALT 12 06/08/2023    GLU 74 06/08/2023       Above labs reviewed    Pertinent Diagnostic  Study Findings:  PET/CT 04/26/23  IMPRESSION:  1. Left percutaneous nephrostomy in place. There is interval increase in now moderate left hydronephrosis.  2. Multiple small abdominopelvic lymph nodes demonstrate metabolic activity at or slightly above blood pool and remain nonspecific. In particular, a periportal lymph node demonstrates slight interval increase in size and hypermetabolic activity. Continued attention on follow-up is recommended.  3. Interval development of numerous predominantly sclerotic osseous lesions throughout the axial and proximal appendicular skeleton. When compared with  prior PET-CT 11/26/2022 many of the previous hypermetabolic lesions demonstrate interval decrease in metabolic activity and increased sclerosis, most compatible with positive response to therapy. Diffuse heterogeneous hypermetabolic activity remains throughout the axial and appendicular skeleton. This may be related to a background of reactive or treatment related process. Ongoing hypermetabolic metastasis is not excluded and continued attention on follow-up is recommended.  4. Interval development of multiple mildly hypermetabolic mediastinal and hilar lymph nodes. Findings are nonspecific and could represent a superimposed infectious or inflammatory process. However, new site of metastatic disease is not excluded and close attention on follow-up is recommended.  5. Persistent focal hypermetabolic activity associated with soft tissue prominence along the left lateral aspect of the distal sigmoid colon. Findings are in the setting of large colonic stool burden and could represent stercoral colitis. Correlate with symptoms.      CT Abdomen/Pelvis 04/20/23  IMPRESSION:  Worsening osseous metastatic disease, as detailed, with a new nondisplaced pathologic fracture, measuring approximately 22 mm in transverse dimension, at the left iliac wing.     Placement of a left nephrostomy tube with pigtail component in the inferior renal cortex and medulla. Mild left hydronephrosis, improved from prior. Mild pelvic and ureteral enhancement, nonspecific, may reflect pyelitis. No evidence of pyelonephritis.     Redemonstration of moderate to large-sized stool burden with a mild amount of perirectal soft tissue edema, raising the possibility of stercoral colitis.    MRI Lumbar Spine 04/16/23  IMPRESSION:  Since the previous examination of 12/03/2021 multiple discrete osseous lesions have developed within the spine and pelvis.     Multiple lesions are noted in the ileum bilaterally. Vertebral lesions are noted in the T11, T12 and L1  multiple lesions are noted.      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      Above findings reviewed    ASSESSMENT: Stephen [name] is a 72 year old male with a history of metastatic urothelial carcinoma, OA, HTN, MDD, gout referred to Palliative Care clinic for symptom management and psychosocial support.    Medical chart reviewed in detail.  Above labs reviewed and notable for anemia, elevated creatinine (Cr 1.19) personally and notable for left PCN in place; numerous sclerotic osseous lesions throughout the axial and proximal appendicular skeleton; mildly hypermetabolic mediastinal and hilar lymph nodes.  Recent CT A/P with new nondisplaced pathologic fracture at the left iliac wing.      Stephen [name] was seen today for pain.    Diagnoses and all orders for this visit:    Metastatic urothelial carcinoma (CMS-HCC)    Pain from bone metastases (CMS-HCC)    Cancer related pain    Cervical radiculopathy    Cervical spondylosis without myelopathy    Therapeutic opioid induced constipation    Fatigue, unspecified type    Insomnia, unspecified type    Nausea    Dysphagia, unspecified type    Anxiety disorder due to general medical condition    Memory problem  Recurrent falls    Palliative care by specialist              PLAN:  - patient is unsure of what medications he is taking and when, will reach out to his sister to discuss further and ensure he has a safe medication plan.  Will also discuss his memory problems and frequent falls with her (noted he has referral to [facility] but has not scheduled an appointment yet)  - continue hydromorphone 2mg  PO q4h PRN severe pain.  Patient has a history of opioid overdose, recommend avoiding long acting opioids at this time.  - continue acetaminophen 1000mg  PO q8h PRN pain  - continue duloxetine CR 20mg  PO BID to target pain and anxiety, monitor carefully for signs/symptoms of serotonin syndrome  - continue pregabalin  100mg  PO BID, monitor renal function closely.    - continue tizanidine at dose of 2mg  PO q8h PRN muscle spasms.  Will discuss with patient's sister, consider discontinuing in light of recent recurrent falls.  - have discontinued hydroxyzine, agree with discontinuing this as patient is at high risk of falls  - CURES reviewed  - Narcan PRN prescribed at previous visit and we discussed how/when to use in emergencies.  Confirmed that patient has this at home.  - continue Senna-S 1 tablet PO BID  - continue docusate 100mg  PO BID PRN constipation  - continue Miralax 17gm PO BID PRN constipation  - continue prochlorperazine 10mg  PO q6h PRN nausea  - continue trazodone 50mg   PO nightly PRN insomnia.   - continue follow up with Pain Management  - Continue Nutrition follow up  - Continue follow up with PT, Speech Therapy  - Placed Psychiatry referral, patient has upcoming appointment  - Referred to Memory Clinic to address chronic memory issues but noted there is no appointment today.    - Agree with Surgery evaluation for possible L inguinal hernia  - Reached out to Radiation Oncology to assist patient in scheduling appointment  - Patient adamantly declines IHSS or home based palliative care or physical therapy.  Declines caregiver support.  Will reach out to sister to discuss  - please mychart message or call the palliative care clinic regarding symptom management    Follow up in palliative care clinic on 08/14/23 at 2 PM in person at [facility] (60 minutes)    Patient evaluated with the following [name] Service Members:  Hulan Fess, Spiritual Care Intern    Thank you for your consult.  We will continue to follow this patient and provide longitudinal care, symptom management and psychosocial support.    Shea Gjon Letarte, MD  Attending Physician  [facility] Palliative Care Service    Total Attending time 55 min:  55 min spent performing physical examination, counseling patient, and coordinating their care with medical team.

## 2023-06-26 NOTE — Patient Instructions (Addendum)
Stephen [name] PALLIATIVE CARE SERVICE: AFTER VISIT SUMMARY     PHONE NUMBER (Monday-Friday 8am-5pm) = [phone]   > we have several nurses on our team who all work together to ensure efficient care  > please leave [name] a voicemail and we will call you back   ?   Plan:    -Continue hydromorphone 2mg  1 tablet by mouth every 4 hours as needed for pain  -Continue duloxetine CR (Cymbalta) 20mg  by mouth 2 times a day  -Continue pregabalin (Lyrica) 100mg  by mouth 2 times a day  -Continue Senna 1 tablet by mouth 2 times a day  -Continue docusate 100mg  by mouth twice a day as needed for constipation  -Continue Miralax 1 tablespoon in water daily as needed for constipation  -Continue prochlorperazine 10mg  by mouth every 6 hours as needed for nausea  -Continue follow up with Psychiatry  -I will follow up on your MRI of your cervical spine to evaluate your neck pain     We will see you for follow up on 08/14/23 at 2 PM in person at [facility] (60 minutes)        Stephen [name]  Daily Meds as of 06/26/23    Morning  1 - Multivitamin  1- Zyloprim (Allopurinol) 100mg   1 - Cymbalta (duloxetine) 20mg    1- Senna S (Senexon-S) 50mg -8.6mg   1 - Lyrica (Pregabalin) 100mg     Evening  1 - Senna S (Senexon-S) 50mg -8.6mg   1 - Lyrica (Pregabalin) 100mg   1 - Desyrel (Trazodone) 50mg   1 - Cymbalta (duloxetine) 20mg      List of Stephen A. [name] Service Team Members  Providers: Davy Pique, NP, Era Skeen, DO, Berenice Primas, MD, Novella Rob, MD, Margarita Mail, MD, Clint Guy, MD  ?   Administrative Assistant/Team Coordinator: Jeannetta Ellis   ?   Social Workers: Designer, jewellery, [address] and [employer], [place]      Spiritual Counselors/Chaplains: Lehman Prom, [facility], Dan Europe, [place] CME  ?   Nurse Case Managers: Sherald Barge, RN, Caralee Ates RN, Georjean Mode, LVN   .............................................................................................................................................      How to contact us:    ?   NURSES' LINE: ???????[phone] (Please leave a voicemail and we will call you back.)   FAX: ???????????????            [phone]   MyChart: ???????           Please use MyChart for non-emergent issues.   ?   Hours: Monday through Friday 8:00 am - 5:00 pm?      After hours: For urgent symptom issues after hours, please call [phone] and ask for the on-call palliative care doctor.   ?   Emergency: Please call 911 or go to the nearest emergency room.?   .............................................................................................................................................   ?  [facility] Chart Help Desk Phone Number: [phone]      ............................................................................................................................................      Special Instructions Regarding Medications:   ?   1. Prescription Refills   -Please let us know at least 72 hours in advance if you need a refill via phone call or via MyChart message.   -We can also prescribe all of your palliative care medications through  mail order pharmacy,  -------- please let us know 4 business days in advance of running out  -------- this pharmacy is not open on the weekends   ---------no deliveries on holidays  -------- someone will need to be home to sign for the medication (will need to show drivers license)  -  Pain medications to treat cancer related pain should only be prescribed by [name] (unless you need to go to the ED)   -We must see you regularly to assess you and safely prescribe  -If you miss an appointment or need to cancel/reschedule please call us right away at [phone] and leave a voicemail   ?   2. Storage    -Medicines should be stored in the original bottles in a cool, dry place.     -Please keep them securely out of reach of children and pets.   ?   3. Disposal: Please dispose of your medicines safely. Here is a link to safe disposal sites in [place]:  http://trevino.com/.php    ?   4. Precaution: Please keep all medications out of reach of children.   ?   .............................................................................................................................................?      Advance Care Planning   ?   Many patients ask about the differences between an Advance Directive and a POLST.       Here is a Lobbyist that clarifies the differences between these documents:    [email or web address] ?      Differences between an Advance Directive and a POLST are:    An Advance Directive is for:    1. Anyone 15 years of age or older    2. Provides instructions for future treatment    3. Appoints a health care representative    4. Guides inpatient treatment decisions when make available       A POLST is for:    1. Anyone with a serious illness - any age   2. Provides medical orders for current treatment    3. Guides actions by Emergency Medical Personnel when made available    4. Guides inpatient treatment decisions when made available   5. This is a bright pink form that should be placed on your refrigerator    .............................................................................................................................................      Patient Experience Department   ?   The Patient Experience Department acts as a bridge between our patients, hospitals and physicians to respond to concerns, issues and requests. Patient experience specialists are here to help patients and their families communicate their experiences with [facility], [place] and navigate our health system. Please call [phone] or send an email to [email or web address].    ..............................................Marland Kitchen     MyPath     We are excited to share our new [facility], Cancer Services application: MyPath. MyPath will guide you through your unique cancer  journey and connect you with our expansive network of curated support services and resources. It is available in [place] and [place].     You can download the MyPath application in four easy steps:     1.       Use the QR code to open your app store.  2.       Click on the MyPath app icon and install on your device.  3.       Open the MyPath app.  4.       Enter your [facility] username and password to log in.

## 2023-06-26 NOTE — Telephone Encounter (Signed)
Dr. Roseanne Reno and [name], [place],     Stephen [name] is scheduled for infusion of Padcev/Keytruda on 07/11.     The patient has a scheduled clinic visit on 07/11.    The following items are currently outstanding:    Orders are unsigned  Treatment plan date is outside of the plan's treatment window  Please adjust the date to reflect the patient's scheduled appointment on 07/11    *As a reminder of our infusion center process, if an unsigned order and/or consent are not addressed 1 business day before the infusion center appointment, the patient appointment will be cancelled.      Thank you,   Gerri Spore, RN [name]

## 2023-06-26 NOTE — Interdisciplinary (Signed)
Palliative Care RN Note    Reason for visit: follow up   Dx urothelial carcinoma    Advanced Care Planning:  on file   POLST not on file    Assessment:    Chief complaint: pain/anxiety    - Patient Orientation: A&O x 4, answers questions readily and appropriately with recent and remote memories intact    Pain Assessment:  Location (s): right shoulder, lower back  legs  Symptoms tingling, sometimes sharp  current pain -worse 6-7/10           Pain Medication routine  Dilaudid 2 mg pt unsure how often    Bowel Movements:   - Consistency constipation  - not managed with Senna (unsure of dose)    Nausea:  - Denies N/V    Anxiety/depression/fatigue/sleep  -s lot of anxiety   Buspar  Lyrica 100 mg   Desyrel 50 mg     Appetite  -  ok  Difficulty swallowing     WellBeing Assessment:   -Patient's needs currently being addressed.  Patient's affect/mood appropriate for situation.  Lives with sister Rinaldo Cloud       Education: Chief Operating Officer and verbal instruction provided regarding plan of care.   Barriers to learning assessed: No barriers noted. Patient verbalized understanding of teaching and instructions.       Contact information, AVS given and reviewed related to plan of care. Questions answered to patient's satisfaction and verbalized understanding. Patient/family aware to call with any questions or concerns that may arise.    Caralee Ates RN

## 2023-06-26 NOTE — Interdisciplinary (Signed)
Spiritual Care Note    Date of Spiritual Care Visit: 06/26/23  Referred By: Palliative Care    Spiritual Screening Provided: No  Date of Spiritual Screening: 05/29/23    Assessment: Self-determination and connection to nature.    Interventions: Emotional support, Listening, [facility]    Outcomes: Gratitude  Will Spiritual Care provide a Follow-Up Visit? No  Follow-Up Details:     Stephen [name]  06/26/2023 8:55 AM    To request Spiritual Care services, please place an order for "IP Consult to Spiritual Care".  For urgent/STAT requests, please also page [phone].

## 2023-06-27 ENCOUNTER — Other Ambulatory Visit (HOSPITAL_BASED_OUTPATIENT_CLINIC_OR_DEPARTMENT_OTHER): Payer: Self-pay | Admitting: Psychiatry

## 2023-06-27 DIAGNOSIS — F411 Generalized anxiety disorder: Secondary | ICD-10-CM

## 2023-06-29 ENCOUNTER — Ambulatory Visit
Admission: RE | Admit: 2023-06-29 | Discharge: 2023-06-29 | Disposition: A | Discharge status: Home / Self Care | Payer: Medicare Other | Attending: Physician Assistant | Admitting: Physician Assistant

## 2023-06-29 ENCOUNTER — Encounter (HOSPITAL_BASED_OUTPATIENT_CLINIC_OR_DEPARTMENT_OTHER): Payer: Self-pay | Admitting: Hematology & Oncology

## 2023-06-29 ENCOUNTER — Ambulatory Visit (HOSPITAL_BASED_OUTPATIENT_CLINIC_OR_DEPARTMENT_OTHER): Payer: Medicare Other | Admitting: Hematology & Oncology

## 2023-06-29 ENCOUNTER — Ambulatory Visit (HOSPITAL_BASED_OUTPATIENT_CLINIC_OR_DEPARTMENT_OTHER): Admit: 2023-06-29 | Discharge: 2023-06-29 | Payer: Medicare Other

## 2023-06-29 VITALS — BP 139/76 | HR 53 | Temp 97.2°F | Resp 17 | Ht 66.14 in | Wt 157.8 lb

## 2023-06-29 VITALS — BP 112/84 | HR 52 | Temp 96.9°F | Resp 18 | Ht 66.14 in | Wt 158.2 lb

## 2023-06-29 VITALS — BP 114/58 | HR 53 | Temp 97.2°F | Resp 17 | Ht 66.14 in | Wt 157.8 lb

## 2023-06-29 DIAGNOSIS — C688 Malignant neoplasm of overlapping sites of urinary organs: Secondary | ICD-10-CM | POA: Insufficient documentation

## 2023-06-29 DIAGNOSIS — Z5189 Encounter for other specified aftercare: Secondary | ICD-10-CM

## 2023-06-29 DIAGNOSIS — C662 Malignant neoplasm of left ureter: Secondary | ICD-10-CM

## 2023-06-29 DIAGNOSIS — C7910 Secondary malignant neoplasm of unspecified urinary organs: Secondary | ICD-10-CM | POA: Insufficient documentation

## 2023-06-29 DIAGNOSIS — Z5112 Encounter for antineoplastic immunotherapy: Secondary | ICD-10-CM | POA: Insufficient documentation

## 2023-06-29 DIAGNOSIS — Z95828 Presence of other vascular implants and grafts: Secondary | ICD-10-CM | POA: Insufficient documentation

## 2023-06-29 LAB — CORTISOL, BLOOD: Cortisol: 16.8 ug/dL

## 2023-06-29 LAB — COMPREHENSIVE METABOLIC PANEL, BLOOD
ALT (SGPT): 9 U/L (ref 0–41)
AST (SGOT): 18 U/L (ref 0–40)
Albumin: 4 g/dL (ref 3.5–5.2)
Alkaline Phos: 107 U/L (ref 40–129)
Anion Gap: 11 mmol/L (ref 7–15)
BUN: 31 mg/dL — ABNORMAL HIGH (ref 8–23)
Bicarbonate: 28 mmol/L (ref 22–29)
Bilirubin, Tot: 0.47 mg/dL (ref <1.2)
Calcium: 9.6 mg/dL (ref 8.5–10.6)
Chloride: 100 mmol/L (ref 98–107)
Creatinine: 1.14 mg/dL (ref 0.67–1.17)
Glucose: 108 mg/dL — ABNORMAL HIGH (ref 70–99)
Potassium: 4.2 mmol/L (ref 3.5–5.1)
Sodium: 139 mmol/L (ref 136–145)
Total Protein: 6.9 g/dL (ref 6.0–8.0)
eGFR Based on CKD-EPI 2021 Equation: >60 mL/min/{1.73_m2}

## 2023-06-29 LAB — CBC WITH DIFF, BLOOD
ANC-Automated: 4.3 10*3/uL (ref 1.6–7.0)
ANC-Instrument: 4.3 10*3/uL (ref 1.6–7.0)
Abs Basophils: 0 10*3/uL (ref <0.2)
Abs Eosinophils: 0.1 10*3/uL (ref 0.0–0.5)
Abs Lymphs: 1.6 10*3/uL (ref 0.8–3.1)
Abs Monos: 0.6 10*3/uL (ref 0.2–0.8)
Basophils: 0.5 %
Eosinophils: 2 %
Hct: 31.6 % — ABNORMAL LOW (ref 40.0–50.0)
Hgb: 10.5 g/dL — ABNORMAL LOW (ref 13.7–17.5)
Imm Gran %: 0.3 % (ref <1)
Lymphocytes: 23.9 %
MCH: 30 pg (ref 26.0–32.0)
MCHC: 33.2 g/dL (ref 32.0–36.0)
MCV: 90.3 um3 (ref 79.0–95.0)
MPV: 9.9 fL (ref 9.4–12.4)
Monocytes: 9.2 %
Plt Count: 214 10*3/uL (ref 140–370)
RBC: 3.5 10*6/uL — ABNORMAL LOW (ref 4.60–6.10)
RDW: 15.9 % — ABNORMAL HIGH (ref 12.0–14.0)
Segs: 64.1 %
WBC: 6.7 10*3/uL (ref 4.0–10.0)

## 2023-06-29 LAB — TSH, BLOOD: TSH: 1.79 u[IU]/mL (ref 0.27–4.20)

## 2023-06-29 LAB — FREE THYROXINE, BLOOD: Free T4: 1.15 ng/dL (ref 0.93–1.70)

## 2023-06-29 MED ORDER — SODIUM CHLORIDE 0.9 % IV SOLN
Freq: Once | INTRAVENOUS | Status: AC
Start: 2023-06-29 — End: 2023-06-29

## 2023-06-29 MED ORDER — LIDOCAINE HCL (PF) 1 % IJ SOLN
0.3000 mL | INTRAMUSCULAR | Status: DC | PRN
Start: 2023-06-29 — End: 2023-06-29

## 2023-06-29 MED ORDER — SODIUM CHLORIDE 0.9 % IV SOLN
200.0000 mg | Freq: Once | INTRAVENOUS | Status: AC
Start: 2023-06-29 — End: 2023-06-29
  Administered 2023-06-29: 200 mg via INTRAVENOUS
  Filled 2023-06-29: qty 8

## 2023-06-29 MED ORDER — LIDOCAINE HCL (PF) 1 % IJ SOLN
0.3000 mL | INTRAMUSCULAR | Status: DC | PRN
Start: 2023-06-29 — End: 2023-06-29
  Administered 2023-06-29: 0.3 mL via INTRADERMAL
  Filled 2023-06-29: qty 2

## 2023-06-29 MED ORDER — HEPARIN SODIUM LOCK FLUSH 100 UNIT/ML IJ SOLN CUSTOM
500.0000 [IU] | INTRAVENOUS | Status: DC | PRN
Start: 2023-06-29 — End: 2023-06-29
  Administered 2023-06-29: 500 [IU] via INTRAVENOUS

## 2023-06-29 MED ORDER — ONDANSETRON HCL 4 MG/2ML IV SOLN
8.0000 mg | Freq: Once | INTRAMUSCULAR | Status: AC
Start: 2023-06-29 — End: 2023-06-29
  Administered 2023-06-29: 8 mg via INTRAVENOUS
  Filled 2023-06-29: qty 4

## 2023-06-29 MED ORDER — SODIUM CHLORIDE 0.9 % IV SOLN
50.0000 mg | Freq: Once | INTRAVENOUS | Status: AC
Start: 2023-06-29 — End: 2023-06-29
  Administered 2023-06-29: 50 mg via INTRAVENOUS
  Filled 2023-06-29: qty 30

## 2023-06-29 MED ORDER — SODIUM CHLORIDE 0.9 % IJ SOLN (CUSTOM)
20.0000 mL | INTRAMUSCULAR | Status: DC | PRN
Start: 2023-06-29 — End: 2023-06-29
  Administered 2023-06-29 (×2): 20 mL via INTRAVENOUS

## 2023-06-29 MED ORDER — SODIUM CHLORIDE 0.9 % IJ SOLN (CUSTOM)
20.0000 mL | INTRAMUSCULAR | Status: DC | PRN
Start: 2023-06-29 — End: 2023-06-29
  Administered 2023-06-29: 20 mL via INTRAVENOUS

## 2023-06-29 NOTE — Progress Notes (Signed)
Name: Stephen [name]   Date of Birth: 24-Jan-1951  Medical Record Number: [id number]    Genitourinary Medical Oncology Clinic     Patient ID: Metastatic urothelial carcinoma    Stage: Cancer Staging   Ureter malignant neoplasm, left (CMS-HCC)  Staging form: Renal Pelvis And Ureter, AJCC 8th Edition  - Clinical: Stage IV (cTX, cN0, pM1) - Signed by Cheri Guppy, MD on 12/01/2022    Oncologic History:    04/22/21  CTU: filling defect in L renal pelvis  05/05/21  Cysto with L ureteroscopy revealed a L papillary tumor, biopsied: mixed high (30%) and low grade urothelial carcinoma  06/16/21  Repeat cysto, papillary tumor: pathology low grade papillary [place]  09/xx/22  Completed induction with mitogel (6 cycles)  08/19/21  HGTa of the bladder, no muscle  08/26/21  Repeat TURBT: NED, muscle identified  10/26/21  MRU: mild thickening in the L renal pelvis  12/24/21  Cysto/L ureteroscopy: dysplasia in the bladder; no disease in L ureter  05/06/22  L PCN placed  06/01/22  TURBT and L ureteroscopy: NED, stricture in place, stent placed  09/29/22  TURBT and L ureteroscopy: NED  10/22/22  MRU: Mild diffuse thickening in the L ureter/renal pelvis. Multiple osseous lesions involving the bilateral iliac crest and right acetabulum concerning for osseous metastatic disease.   11/26/22  PET/CT: diffuse osseous FDG activity with discrete osseous lesions concerning for metastatic disease.  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  Also treated for septic shock, AKI (Cr 2.5) and acute liver injury (AST/ALT 2800/2100).   S/p exchange L PCN.   02/07/23  start XRT to R Shoulder  02/10/23  C2D1 EV (dose reduced to 1.0 mg/kg) + pembro 200 mg  IV  03/03/23  C3D1 EV/pembro, missed D8  03/06/23  Cysto: NED.  Cytology NED.   03/14/23  Seen at [facility] ED for dislodged tube, replaced  04/02-05/24  Admitted with weakness, poor PO intake.   04/06/23  C4D1 EV/pembro  04/13/23  C4D8 EV  04/26/23  PET/CT: osseous lesions much improved, many new sclerotic lesions c/w treatment change.  Nonspecific mediastinal and pelvic LN  05/09-11/24  Admitted for treatment of AKI and rhabdomyolysis  06/01/23  C6D1 EV/Pembro  06/23/23  MRI C-Spine: disease in spine, but no fracture or epidural involvement.  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  He's drinking 2-3 protein shakes today.  Dentures will come in October.  Pain is more or less under control.   Still has not made appt with rad onc to finish radiation therapy.   L PCN is draining well, clear yellow  Occ tingling in toes/hands  He had a fall on Sunday    Review of Systems:   A complete ROS was performed and is negative except as documented above.     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  Non smoker  No etoh    Medications:  Current Outpatient Medications on File Prior to Visit   Medication Sig Dispense Refill    acetaminophen (TYLENOL) 500 MG tablet Take 2 tablets (1,000 mg) by mouth every 8 hours as needed for Mild Pain (Pain Score 1-3) or Moderate Pain (Pain Score 4-6). 40 tablet 0    albuterol (PROAIR HFA) 108 (90 Base) MCG/ACT inhaler ProAir HFA 90 mcg/actuation aerosol inhaler      allopurinol (ZYLOPRIM) 100 MG tablet Take 1 tablet (100 mg) by  mouth daily. 30 tablet 0    Artificial Tear Solution (SOOTHE XP OP)       busPIRone (BUSPAR) 10 MG tablet One tab po daily for 5 days, then one tab po bid 60 tablet 0    diclofenac (VOLTAREN) 1 % gel Apply 2 g topically every 6 hours as needed (Right knee pain). 100 g 0    DULoxetine (CYMBALTA) 20 MG CR capsule Take 1 capsule (20 mg) by mouth 2 times daily. 180 capsule 2    fluticasone propionate (FLONASE) 50 MCG/ACT nasal spray 2 sprays.      HYDROmorphone (DILAUDID) 2 MG tablet Take 1 tablet (2 mg) by mouth every 4 hours as needed for Moderate Pain (Pain Score 4-6) or Severe Pain (Pain Score 7-10). 60 tablet 0    hydrOXYzine HCL (ATARAX) 25 MG tablet Take 1 tablet (25 mg) by mouth every 8 hours as needed for Anxiety. 30 tablet 2    ibuprofen (MOTRIN) 600 MG tablet       ketotifen (ALAWAY) 0.025 % ophthalmic solution Place 1 drop into both eyes 2 times daily.      loperamide (IMODIUM) 2 MG capsule For mild to moderate diarrhea (less than 5 BMs within 24 hours):  Take 1 capsule (2 mg) by mouth 4 times daily as needed for Diarrhea.  For severe Diarrhea (5 or more medium to large BMs within 24 hours):  take 2 capsules and then 1 capsule every 2 hours until diarrhea stops. 90 capsule 0    loratadine (CLARITIN REDITABS) 10 MG dissolvable tablet       LORazepam (ATIVAN) 0.5 MG tablet Take 1 tablet by mouth 30 to 60 minutes prior to MRI.  May repeat dose once if needed. 2 tablet 0    Multiple Vitamins-Minerals (MULTIVITAMIN WITH MINERALS) TABS tablet Take 1 tablet by mouth daily. 30 tablet 1    naloxone (NARCAN) 4 mg/0.1 mL nasal spray For suspected opioid overdose, call 911! Then spray once in one nostril. Repeat after 3 minutes if no or minimal response using a new spray in other nostril. 2 each 0    Nutritional Supplements (NEPRO) LIQD Take 3 Cans by mouth daily. 30000 mL 11    polyethylene glycol (MIRALAX) 17 g packet Take 1 packet (17 g) by mouth 2 times daily as needed for Constipation. 1 each 0    pregabalin  (LYRICA) 100 MG capsule Take 1 capsule (100 mg) by mouth 2 times daily. 60 capsule 0    prochlorperazine (COMPAZINE) 10 MG tablet Take 1 tablet (10 mg) by mouth every 6 hours as needed (Nausea/Vomiting). 30 tablet 5    senna (SENOKOT) 8.6 MG tablet Take 2 tablets (17.2 mg) by  mouth 2 times daily. 30 tablet 0    tizanidine (ZANAFLEX) 2 MG tablet Take 1 tablet (2 mg) by mouth every 8 hours as needed (muscle spasm). 30 tablet 2    traZODone (DESYREL) 50 MG tablet Take one tab nightly by mouth at bedtime as needed for insomnia 90 tablet 0     No current facility-administered medications on file prior to visit.       Physical Examination:   06/29/23  0825   BP: 139/76   Pulse: 53   Temp: 97.2 F (36.2 C)   Resp: 17   SpO2: 100%     ECOG PS 1  General: NAD  HEENT: Moist mucous membranes.   Neck: No thyromegaly or lymphadenopathy.   Chest: Clear to ausculation bilaterally. No wheezes, rales or rhonchi.    Cardiac: Regular rate and rhythm without any murmurs, rubs or gallops.   Abdomen: Abd soft, non-tender. +BS.   Extremities: No clubbing, cyanosis. No edema  Neurological: Awake, alert and oriented x 3. Strength and sensation are grossly intact.   Psychologic: Appropriate mood and affect.  GU: L PCN site clean and dry, draining clear, yellow urine.     Labs: Following labs reviewed  Lab Results   Component Value Date    WBC 6.7 06/29/2023    RBC 3.50 (L) 06/29/2023    HGB 10.5 (L) 06/29/2023    HCT 31.6 (L) 06/29/2023    MCV 90.3 06/29/2023    MCHC 33.2 06/29/2023    RDW 15.9 (H) 06/29/2023    PLT 214 06/29/2023    MPV 9.9 06/29/2023      Lab Results   Component Value Date    BUN 27 (H) 06/08/2023    CREAT 1.19 (H) 06/08/2023    CL 102 06/08/2023    NA 141 06/08/2023    K 4.4 06/08/2023    [place] 9.7 06/08/2023    TBILI 0.64 06/08/2023    ALB 4.2 06/08/2023    TP 6.9 06/08/2023    AST 20 06/08/2023    ALK 100 06/08/2023    BICARB 31 (H) 06/08/2023    ALT 12 06/08/2023    GLU 74 06/08/2023                Imaging:    PET CT  5/08:  1. Left percutaneous nephrostomy in place. There is interval increase in now moderate left hydronephrosis.  2. Multiple small abdominopelvic lymph nodes demonstrate metabolic activity at or slightly above blood pool and remain nonspecific. In particular, a periportal lymph node demonstrates slight interval increase in size and hypermetabolic activity. Continued attention on follow-up is recommended.  3. Interval development of numerous predominantly sclerotic osseous lesions throughout the axial and proximal appendicular skeleton. When compared with prior PET-CT 11/26/2022 many of the previous hypermetabolic lesions demonstrate interval decrease in metabolic activity and increased sclerosis, most compatible with positive response to therapy. Diffuse heterogeneous hypermetabolic activity remains throughout the axial and appendicular skeleton. This may be related to a background of reactive or treatment related process. Ongoing hypermetabolic metastasis is not excluded and continued attention on follow-up is recommended.  4. Interval development of multiple mildly hypermetabolic mediastinal and hilar lymph nodes. Findings are nonspecific and could represent a superimposed infectious or inflammatory process. However, new site of metastatic disease is not excluded and close attention on follow-up is recommended.  5. Persistent focal hypermetabolic activity associated with soft tissue prominence along the left lateral aspect of the distal sigmoid colon. Findings are in the  setting of large colonic stool burden and could represent stercoral colitis. Correlate with symptoms.      Assessment:  Stephen [name] is a 72 year old male with OA, HTN, MDD with L UTUC with metastatic disease ([id number]), with osseous mets (Pet/CT with diffuse enhancement and discrete lesions). FGFR-TACC mutation.  Started on EV+Pembro.  Scans after 4 cycles with improvement of osseous lesions, but demonstrate substantial sclerotic osseous  lesions, suggesting treated lesions not scene prior.    I personally reviewed labs as above.    Here prior to C7 EV/P.  He appears to be tolerating EV/P at this dose reduction.    We are still awaiting his dental clearance to start zometa.  Continue on Ev/P, staging scans prior to next visit    Advanced Urothelial Carcinoma, [id number]  - proceed with C7  - Treatment plan:  --- Enfortumab vedotin 1.25 --> 1.0 mg/kg --> 0.75 mg/kg  (dose reduced prior to C5 to 0.75) D1, D8 Q21d  --- Pembrolizumab 200 mg D1 Q21d  --- prn anti-nausea meds  --- prn antidiarrheal medications  --- prn topical skin care  --- ED precautions  - consider adding zometa given significnt bone disease; needs dental clearance - he's known to have significant dental issues so this needs to be cleared first  - RTC 3 weeks with Pet/CT prior    Cancer related pain  - follows with palliative care, on multiple medications for pain including dilaudid  - caution given multiple events of AMS    R shoulder pain  - 2/2 metastatic lesion  - seen by ortho 1/22  - needs follow up with rad onc to get XRT restarted    Neck pain  - to see ortho    Anxiety  - f/u with palliative care  - monitor closely    RLE edema  - improved    L PCN, initially placed 04/2022  - draining well, will monitor    This visit was spent addressing a complex, chronic illness that poses a threat to life or bodily function to this patient.  I independently reviewed the patient's history since prior visit, reviewed intervening labs and imaging, counseled the patient on the management of their chronic illness, and placed orders to be done prior to next visit.     Asa Saunas. Roseanne Reno, M.D.  Assistant Professor of Medicine   [employer] [place], [facility]  [facility]  [address]  [place], [place] [address]

## 2023-06-29 NOTE — Interdisciplinary (Signed)
Chemotherapy Nursing Note - [facility]    Kelson Garbo is a 72 year old male who presents for chemotherapy     Type Current Treatment Planned For    Enfortumab vedotin-ejfv (Padcev) Days 1, 8 / Pembrolizumab Day 1 - Every 21 Days Oncology Treatment () Day 1, Cycle 7 Today       Current Chemotherapy Signed Informed Consent verified/obtained yes- 01/06/23    Pre-treatment nursing assessment:  No problems identified upon assessment.  -denies worsening visual changes. Not wearing eyeglasses  -denies any SOB, new cough. Pt said just his usual sinusitis this morning  - no new skin rash  -no worsening skin rash    Patient met treatment parameters.    Lab Results   Component Value Date    ABSNEUTRO 4.3 06/29/2023    IANC 4.3 06/29/2023    WBC 6.7 06/29/2023    RBC 3.50 (L) 06/29/2023    HGB 10.5 (L) 06/29/2023    HCT 31.6 (L) 06/29/2023    MCV 90.3 06/29/2023    MCHC 33.2 06/29/2023    RDW 15.9 (H) 06/29/2023    PLT 214 06/29/2023       Lab Results   Component Value Date    NA 139 06/29/2023    K 4.2 06/29/2023    CL 100 06/29/2023    BICARB 28 06/29/2023    BUN 31 (H) 06/29/2023    CREAT 1.14 06/29/2023    GLU 108 (H) 06/29/2023     9.6 06/29/2023           Lab Results   Component Value Date    AST 18 06/29/2023    ALT 9 06/29/2023    GGT 131 (H) 01/17/2022    LDH 172 01/19/2022    ALK 107 06/29/2023    TP 6.9 06/29/2023    ALB 4.0 06/29/2023    TBILI 0.47 06/29/2023    DBILI <0.2 01/19/2022        Vitals:    06/29/23 0804 06/29/23 0953   BP:  (!) 121/52   BP Location:  Right arm   BP Patient Position:  Sitting   Pulse:  (!) 47   Resp:  17   Temp:  96.8 F (36 C)   TempSrc:  Temporal   SpO2:  99%   Weight: 71.6 kg (157 lb 13.6 oz) 71.7 kg (158 lb 2.9 oz)   Height: 5' 6.14" (1.68 m) 5' 6.14" (1.68 m)     Pain Score:    Body surface area is 1.83 meters squared.  Body mass index is 25.42 kg/m.    ECOG  ECOG: 2 Not Applicable    PHQ2  PHQ2 Total: 1  Not Applicable    Chemotherapy:  Tre Barcellona  tolerated treatment well.    Post blood return: Brisk  IV access post infusion: NS and Heparin 5000 units/ml   per MAR and de-accessed with needle intact. Site benign. Dressing applied     Patient Education  Learner: Patient  Barriers to learning: No Barriers  Readiness to learn: Acceptance  Method: Explanation    Treatment Education:       Signs and symptoms of infection, bleeding, adverse reaction(s), symptom control, and when to notify MD.    Patient instructed on post-treatment care and precautions specific to drug(s) administered.   If applicable, education given on vesicant administration risk and signs and symptoms of extravasation (redness, pain, swelling, pressure at IV site) to report immediately to the chemotherapy RN.  Fall Prevention Education: Instructed patient to call for assistance, call light within reach.   Pain Education: Patient instructed to contact nurse if pain should develop or if their current pain therapy becomes ineffective.       Treatment education provided to patient: yes    Response: Verbalizes understanding    Discharge Plan  Discharge instructions given to patient.  Future appointments:date given and reviewed with treatment plan. Next appointment on 7/18- pt is aware  Discharge Mode: Dan Humphreys with transportation driver  Discharge Time: 4696   Accompanied by: Self  Discharged To: Home   Medications   lidocaine (XYLOCAINE) 1% PF injection 0.3 mL (has no administration in time range)   sodium chloride 0.9 % flush 20 mL (20 mL IntraVENOUS Given 06/29/23 1006)   heparin (HEP-LOCK) flush injection 500 Units (has no administration in time range)   pembrolizumab (KEYTRUDA) 200 mg in sodium chloride 0.9 % 100 mL infusion (200 mg IntraVENOUS New Bag 06/29/23 1152)   sodium chloride 0.9% infusion ( IntraVENOUS New Bag 06/29/23 1006)   ondansetron (ZOFRAN) injection 8 mg (8 mg IntraVENOUS Given 06/29/23 1014)   enfortumab vedotin-ejfv (PADCEV) 50 mg in sodium chloride 0.9 % 100 mL chemo infusion (0  mg IntraVENOUS IV Stop 06/29/23 1110)

## 2023-06-29 NOTE — Interdisciplinary (Signed)
LEFT PCN insertion site assessed.  It is healing well, however is still having a small amount of drainage.  Insertion site cleansed with alcohol pad, dried, and re-dressed    LEFT PCN bag was changed.  Pt was provided with extra bags and supplies at pt visit.  PCN education form reviewed and given to pt upon discharge.      Message  Received: Today  Elsworth Soho, RN  P Muc Infusion Schedulers  Cancer Center Appointment Requests  Location: [facility]  MD Provider: Brock Ra  Type of Appointment: Infusion    Infusion Request  Location: [facility]  Treatment: Padcev + Keytruda  Does the patient have a PICC or a PORT? yes  Acuity Level: 2  Length of Treatment: 2 hrs  Date(s) Requested: 8/1 at 1 pm  Clinic / MD Appointment Same Day? yes    Pre-Chemo Labs: yes  Date(s) Requested: 8/1 at 11 am      Infusion Request  Location: [facility]  Treatment: Padcev  Does the patient have a PICC or a PORT? yes  Acuity Level: 2  Length of Treatment: 1 hrs  Date(s) Requested: 8/8  Clinic / MD Appointment Same Day? no    Pre-Chemo Labs: yes  Date(s) Requested: 8/8    Comments: Please call the patient to schedule, thank you!

## 2023-06-29 NOTE — Patient Instructions (Addendum)
Patient Instructions:    -- Schedule your next follow up appointment with Dr. Roseanne Reno on 8/1 at the front desk on your way out, or by calling the Urology Scheduling line:  [phone].    --Dr. Roseanne Reno has ordered a PET Scan.  Call the PET scheduler at [phone] to *Schedule between 7/25-7/31    --Schedule your next infusions by calling [phone]:  -8/1 (after Dr. Roseanne Reno appt)  -8/9    --Schedule Radiation:  [id number]      Have questions about Advanced Care Planning/Advance Directive?  PREPARE ([email or web address])           Future Appointments   Date Time Provider Department Center   06/29/2023 10:00 AM MUC INFUSION/CTI MUC Onc Chem MUC   06/30/2023 10:00 AM Black, Volney American, MD MON Srg Tram MON   07/03/2023 10:00 AM Zlomislic, Alger Memos, MD [facility]   07/06/2023  8:00 AM FAST TRACK MUC FAST MUC   07/06/2023 10:00 AM MUC INFUSION/CTI MUC Onc Chem MUC   07/06/2023  3:30 PM Riley Lam, RD MUC Onc MUC   07/10/2023  9:00 AM Tommy Rainwater, MD MUC Gen Psy MUC   08/14/2023  2:00 PM Shea Evans, MD MUC Onc MUC         Visit this website for Cancer resources at our [facility].  This includes information on [place] [facility] Support Groups, Mind/Body Wellness, Treatment Education Classes, and other [place] Cancer Services:      [email or web address]       *Chief Operating Officer Website:  [email or web address]    Contact Information:   Brock Ra, MD   Assistant Professor of Medicine  Medical Oncology  Genitourinary Malignancy      Franco Nones, M.S., PA-C  Sr. Physician Assistant    Clinical questions or concerns  Oncology Nurse Case Manager   Elsworth Soho RN, BSN  T: [phone]  Department of Urology   [facility]  [address]  [place], [place] [address]    Dr. Roseanne Reno Follow Up Appointment Assistance  Administrative Assistant   Terence Lux  T:  [phone]  F: [phone]    Monday-Friday 8:00- 4:30p.m. (Closed Holidays and Weekends)  Please listen to message carefully for daily information.   Leave a detailed message with your name, medical record number and   telephone number. Messages are usually returned within 24 hours.     [facility] Phone List     AFTER HOURS EMERGENCY NUMBER   Ask for the Oncologist Physician On-Call.                   [phone]    Outpatient Pavilion Scheduling line: [phone]  Call for information about our new location and to schedule or cancel appointments.    [facility] Scheduling line: [phone]   Call to schedule your [name], CT Scans or MRI studies.    PET Scan Scheduling line: [phone]  Call to schedule your PET scan    Prostate MRI Scheduling line: [phone] or [phone]  Call to schedule your prostate MRI.   Performed at the [facility] ([facility]).    Nuclear Medicine Scheduling Line [phone]   Call to schedule your bone scan, Radium 223     [facility] Infusion Center: [phone]   Call to schedule, cancel, or reschedule chemotherapy appointments.  Infusion Center Hours--- Monday-Friday 7:00-7:30 p.m.; Sat-Sun 8:00-4:30pm.    [facility] ([address]): [phone]  Monday-Friday 730-530 p.m.; Closed Weekends    Radiation Oncology: [phone]    Call to schedule, cancel, or reschedule radiation appointments    Social Worker  [place]: Mathews Robins[place], [place]    T: [phone]  [place]: Henri Medal, LCSW  T: [phone]    Financial Counselors   [phone] at [facility]   [phone] or [phone]      [facility]  [address] [place] [address]    -You can also reach [name] by MyChart. To set up your MyChart please see the following site: [email or web address]  -For cancer support services, please visit out Patient and [facility].  -For [facility] support services, please visit:  -[email or web address]  -For other support from the American Cancer Society, please visit: [email or web address]/[email or web address]              Thank you for allowing [name] to participate in your care today! We value your feedback as we strive to provide every patient with an exceptional care experience.     You may receive a patient satisfaction survey in the mail in the next few weeks. Please fill out the survey upon receipt and return it in the self-addressed envelope. Your feedback will be used to help improve the experience for all our patients at [facility].     If you want to share a positive experience you had at our facility please call We Listen at [phone]. If you have any suggestions on how we can improve our services please call the [facility] leadership team at [phone].    For medical questions or emergencies, please call your physician's office or 911. Thank you!

## 2023-06-29 NOTE — Interdisciplinary (Signed)
Pt in for Port-A-Cath line care and lab draw.  Vital signs stable and pt. in no apparent distress. Site clean, dry, and intact.  Line accessed and blood return verified.  Dressing and posiflow cap changed. Labs drawn per orders and sent. Line flushed with NS and Heparin 500 units/ml.  Pt waiting for lab results.  Pt discharged to MD appt in no apparent distress.

## 2023-06-30 ENCOUNTER — Ambulatory Visit
Payer: Medicare Other | Attending: Student in an Organized Health Care Education/Training Program | Admitting: Student in an Organized Health Care Education/Training Program

## 2023-06-30 ENCOUNTER — Encounter (HOSPITAL_BASED_OUTPATIENT_CLINIC_OR_DEPARTMENT_OTHER): Payer: Self-pay | Admitting: Student in an Organized Health Care Education/Training Program

## 2023-06-30 VITALS — BP 115/58 | HR 66 | Temp 97.8°F | Resp 16 | Wt 162.0 lb

## 2023-06-30 DIAGNOSIS — K4090 Unilateral inguinal hernia, without obstruction or gangrene, not specified as recurrent: Secondary | ICD-10-CM | POA: Insufficient documentation

## 2023-06-30 MED ORDER — BUSPIRONE HCL 10 MG OR TABS
ORAL_TABLET | ORAL | 0 refills | Status: DC
Start: 2023-06-30 — End: 2023-08-15

## 2023-06-30 NOTE — Progress Notes (Signed)
Emergency General Surgery Clinic Note    Reason for visit: left inguinal hernia    HPI:  Stephen [name] is a 71 year old male with PMH of metastatic urothelial carcinoma, left PCN, OA, HTN, MDD, gout, who presents to general surgery clinic for evaluation of left inguinal hernia. The patient reports first noticing the hernia a few months ago. Reports generalized pain due to medical history, but does not feel that it is attributed to his hernia. Patient denies N/V or changes in bowel habits.     ROS  Negative other than in HPI    PMH  Past Medical History:   Diagnosis Date    Chronic back pain     Congenital hydronephrosis     Gout     Headache     Hematuria     HTN (hypertension) 12/10/2021    Kidney disease     Kidney stones     Major depressive disorder, single episode     Polyarthropathy or polyarthritis of multiple sites     Retinal detachment     Urethral stricture        PSH  Past Surgical History:   Procedure Laterality Date    APPENDECTOMY      COLONOSCOPY      CT INSERTION OF SUPRAPUBIC CATH  09/25/2015    CYSTOSCOPY      CYSTOSCOPY W/ LASER LITHOTRIPSY      NEPHRECTOMY Right 1955    OTHER SURGICAL HISTORY      Interstim 01/29/2011    SPINE SURGERY  09/21    Lumbar-sacral fusion    TRANSURETHRAL RESECTION OF PROSTATE         Meds    Current Outpatient Medications:     acetaminophen (TYLENOL) 500 MG tablet, Take 2 tablets (1,000 mg) by mouth every 8 hours as needed for Mild Pain (Pain Score 1-3) or Moderate Pain (Pain Score 4-6)., Disp: 40 tablet, Rfl: 0    albuterol (PROAIR HFA) 108 (90 Base) MCG/ACT inhaler, ProAir HFA 90 mcg/actuation aerosol inhaler, Disp: , Rfl:     allopurinol (ZYLOPRIM) 100 MG tablet, Take 1 tablet (100 mg) by mouth daily., Disp: 30 tablet, Rfl: 0    Artificial Tear Solution (SOOTHE XP OP), , Disp: , Rfl:     busPIRone (BUSPAR) 10 MG tablet, TAKE 1 TAB TWICE DAILY, Disp: 60 tablet, Rfl: 0    diclofenac (VOLTAREN) 1 % gel, Apply 2 g topically every 6 hours as needed (Right knee  pain)., Disp: 100 g, Rfl: 0    DULoxetine (CYMBALTA) 20 MG CR capsule, Take 1 capsule (20 mg) by mouth 2 times daily., Disp: 180 capsule, Rfl: 2    fluticasone propionate (FLONASE) 50 MCG/ACT nasal spray, 2 sprays., Disp: , Rfl:     HYDROmorphone (DILAUDID) 2 MG tablet, Take 1 tablet (2 mg) by mouth every 4 hours as needed for Moderate Pain (Pain Score 4-6) or Severe Pain (Pain Score 7-10)., Disp: 60 tablet, Rfl: 0    hydrOXYzine HCL (ATARAX) 25 MG tablet, Take 1 tablet (25 mg) by mouth every 8 hours as needed for Anxiety., Disp: 30 tablet, Rfl: 2    ibuprofen (MOTRIN) 600 MG tablet, , Disp: , Rfl:     ketotifen (ALAWAY) 0.025 % ophthalmic solution, Place 1 drop into both eyes 2 times daily., Disp: , Rfl:     loperamide (IMODIUM) 2 MG capsule, For mild to moderate diarrhea (less than 5 BMs within 24 hours):  Take 1 capsule (2 mg) by  mouth 4 times daily as needed for Diarrhea.  For severe Diarrhea (5 or more medium to large BMs within 24 hours):  take 2 capsules and then 1 capsule every 2 hours until diarrhea stops., Disp: 90 capsule, Rfl: 0    loratadine (CLARITIN REDITABS) 10 MG dissolvable tablet, , Disp: , Rfl:     LORazepam (ATIVAN) 0.5 MG tablet, Take 1 tablet by mouth 30 to 60 minutes prior to MRI.  May repeat dose once if needed., Disp: 2 tablet, Rfl: 0    Multiple Vitamins-Minerals (MULTIVITAMIN WITH MINERALS) TABS tablet, Take 1 tablet by mouth daily., Disp: 30 tablet, Rfl: 1    naloxone (NARCAN) 4 mg/0.1 mL nasal spray, For suspected opioid overdose, call 911! Then spray once in one nostril. Repeat after 3 minutes if no or minimal response using a new spray in other nostril., Disp: 2 each, Rfl: 0    Nutritional Supplements (NEPRO) LIQD, Take 3 Cans by mouth daily., Disp: 30000 mL, Rfl: 11    polyethylene glycol (MIRALAX) 17 g packet, Take 1 packet (17 g) by mouth 2 times daily as needed for Constipation., Disp: 1 each, Rfl: 0    pregabalin (LYRICA) 100 MG capsule, Take 1 capsule (100 mg) by mouth 2 times  daily., Disp: 60 capsule, Rfl: 0    prochlorperazine (COMPAZINE) 10 MG tablet, Take 1 tablet (10 mg) by mouth every 6 hours as needed (Nausea/Vomiting)., Disp: 30 tablet, Rfl: 5    senna (SENOKOT) 8.6 MG tablet, Take 2 tablets (17.2 mg) by mouth 2 times daily., Disp: 30 tablet, Rfl: 0    tizanidine (ZANAFLEX) 2 MG tablet, Take 1 tablet (2 mg) by mouth every 8 hours as needed (muscle spasm)., Disp: 30 tablet, Rfl: 2    traZODone (DESYREL) 50 MG tablet, Take one tab nightly by mouth at bedtime as needed for insomnia, Disp: 90 tablet, Rfl: 0  No current facility-administered medications for this visit.    Allergies  Allergies   Allergen Reactions    Sulfa Drugs Unspecified, Hives and Other       Family  No family hx bleeding or anesthesia issues    Social  Denies use of tobacco, alcohol, and illicit drugs.     O:  BP (!) 115/58 (BP Location: Right arm, BP Patient Position: Sitting, BP cuff size: Regular)   Pulse 66   Temp 97.8 F (36.6 C) (Temporal)   Resp 16   Wt 73.5 kg (162 lb)   SpO2 98%   BMI 26.04 kg/m     Physical Exam  General: Alert, flat affect, cooperative.  Heart: Normal rate and regular rhythm  Lungs: Unlabored breathing on room air  Abdomen: Soft, non-tender, non-distended, no rebound or guarding.  Left inguinal hernia soft, easily reduces while laying down, NTTP, no overlying skin changes  Extremities: No cyanosis, clubbing, or edema. Full range of motion  Neuro: A&Ox4, motor/sensory grossly intact x 4 extremities.    A/P:  Stephen [name] is a 72 year old male with PMH of metastatic urothelial carcinoma with left PCN, OA, HTN, MDD, gout who presents to general surgery clinic for evaluation of left inguinal hernia. Hernia is easily reducible, NTTP with no skin changes. No surgical intervention indicated at this time. Return to ER precautions provided.     - No surgical intervention indicated at this time  - Return to ER precautions provided    Patient seen and discussed with Dr. Leilani Able, NP  General  Surgery  Service Pager: [phone]    This note Non-Critical Care: represents a split/shared encounter with my supervising attending Dr. Vedia Coffer on 06/30/23. Due to the patient's complex medical condition and the need for medical decision making at the level of the physician, I have seen this patient in collaboration with my supervising attending listed above.  I personally spent 10 minutes today in the care of this patient. The attending will be the billing provider for this patient.

## 2023-07-02 ENCOUNTER — Other Ambulatory Visit (HOSPITAL_BASED_OUTPATIENT_CLINIC_OR_DEPARTMENT_OTHER): Payer: Self-pay | Admitting: Family Medicine

## 2023-07-02 ENCOUNTER — Other Ambulatory Visit (HOSPITAL_BASED_OUTPATIENT_CLINIC_OR_DEPARTMENT_OTHER): Payer: Self-pay | Admitting: Psychiatry

## 2023-07-02 DIAGNOSIS — C7910 Secondary malignant neoplasm of unspecified urinary organs: Secondary | ICD-10-CM

## 2023-07-02 DIAGNOSIS — G893 Neoplasm related pain (acute) (chronic): Secondary | ICD-10-CM

## 2023-07-02 DIAGNOSIS — F411 Generalized anxiety disorder: Secondary | ICD-10-CM

## 2023-07-02 DIAGNOSIS — F064 Anxiety disorder due to known physiological condition: Secondary | ICD-10-CM

## 2023-07-03 ENCOUNTER — Ambulatory Visit (HOSPITAL_BASED_OUTPATIENT_CLINIC_OR_DEPARTMENT_OTHER): Admit: 2023-07-03 | Discharge: 2023-07-03 | Disposition: A | Discharge status: Home Health | Payer: Medicare Other

## 2023-07-03 ENCOUNTER — Ambulatory Visit (HOSPITAL_BASED_OUTPATIENT_CLINIC_OR_DEPARTMENT_OTHER): Payer: Medicare Other

## 2023-07-03 ENCOUNTER — Ambulatory Visit (HOSPITAL_BASED_OUTPATIENT_CLINIC_OR_DEPARTMENT_OTHER): Payer: Medicare Other | Admitting: Nurse Practitioner

## 2023-07-03 ENCOUNTER — Ambulatory Visit
Payer: Medicare Other | Attending: Student in an Organized Health Care Education/Training Program | Admitting: Orthopaedic Surgery of the Spine

## 2023-07-03 ENCOUNTER — Other Ambulatory Visit (HOSPITAL_BASED_OUTPATIENT_CLINIC_OR_DEPARTMENT_OTHER): Payer: Self-pay | Admitting: Nurse Practitioner

## 2023-07-03 VITALS — BP 115/57 | HR 55 | Temp 98.5°F | Ht 66.0 in | Wt 162.0 lb

## 2023-07-03 DIAGNOSIS — C679 Malignant neoplasm of bladder, unspecified: Secondary | ICD-10-CM

## 2023-07-03 DIAGNOSIS — M5412 Radiculopathy, cervical region: Secondary | ICD-10-CM

## 2023-07-03 DIAGNOSIS — Z981 Arthrodesis status: Secondary | ICD-10-CM

## 2023-07-03 DIAGNOSIS — M47812 Spondylosis without myelopathy or radiculopathy, cervical region: Secondary | ICD-10-CM

## 2023-07-03 DIAGNOSIS — G893 Neoplasm related pain (acute) (chronic): Secondary | ICD-10-CM

## 2023-07-03 DIAGNOSIS — C7951 Secondary malignant neoplasm of bone: Secondary | ICD-10-CM

## 2023-07-03 DIAGNOSIS — F064 Anxiety disorder due to known physiological condition: Secondary | ICD-10-CM

## 2023-07-03 DIAGNOSIS — C7910 Secondary malignant neoplasm of unspecified urinary organs: Secondary | ICD-10-CM

## 2023-07-03 MED ORDER — PREGABALIN 100 MG OR CAPS
100.00 mg | ORAL_CAPSULE | Freq: Every evening | ORAL | 0 refills | Status: DC
Start: 2023-07-03 — End: 2023-07-25

## 2023-07-03 NOTE — Progress Notes (Signed)
ORTHOPAEDIC SPINE SURGERY     Visit Type: Office Visit    Reason For Visit: Consultation (Stage 2 revision laminectomy and PSIF L3 thru S1, pain)    Surgery Date: 11/08/2021    Procedure: XLIF L2-3 and PSIF L2 thru pelvis     History Of Present Illness: 72 year old male returns to spine clinic for follow up evaluation now approximately 6 months s/p revision lumbar decompression and fusion L2 thru pelvis. He states that he is doing well although has had ongoing complaints of generalized back stiffness and intermittent pain, which is well controlled. He had completed a course of PT, although has had several medical issues. 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 His primary complaint today is neck pain and stiffness with associated pain and paresthesias extending into his upper extremities and shoulder.  Patient rates his pain as 4. Patient reports no changes in bowel and bladder function. Patient returns for follow up.    Interval hx 07/03/23: patient returns today for follow up. He reports he has a lot of pain everywhere. He says his biggest source of pain are his anterior thighs bilaterally, w/ pain radiating to the R medial knee, and complains of R knee buckling. He reports numbness/tingling in his feet as well. He says he recently saw Speech therapy due to some swalling issues and was told it is due to his cervical spine. He reports radiating pain into both arms as well.     Allergies:   Allergies   Allergen Reactions    Sulfa Drugs Unspecified, Hives and Other       Medications:   Current Outpatient Medications   Medication Sig    acetaminophen (TYLENOL) 500 MG tablet Take 2 tablets (1,000 mg) by mouth every 8 hours as needed for Mild Pain (Pain Score 1-3) or Moderate Pain (Pain Score 4-6).    albuterol (PROAIR HFA) 108 (90 Base) MCG/ACT inhaler ProAir HFA 90 mcg/actuation aerosol inhaler    allopurinol (ZYLOPRIM) 100 MG tablet Take 1 tablet (100  mg) by mouth daily.    Artificial Tear Solution (SOOTHE XP OP)     busPIRone (BUSPAR) 10 MG tablet TAKE 1 TAB TWICE DAILY    diclofenac (VOLTAREN) 1 % gel Apply 2 g topically every 6 hours as needed (Right knee pain).    DULoxetine (CYMBALTA) 20 MG CR capsule Take 1 capsule (20 mg) by mouth 2 times daily.    fluticasone propionate (FLONASE) 50 MCG/ACT nasal spray 2 sprays.    HYDROmorphone (DILAUDID) 2 MG tablet Take 1 tablet (2 mg) by mouth every 4 hours as needed for Moderate Pain (Pain Score 4-6) or Severe Pain (Pain Score 7-10).    hydrOXYzine HCL (ATARAX) 25 MG tablet Take 1 tablet (25 mg) by mouth every 8 hours as needed for Anxiety.    ibuprofen (MOTRIN) 600 MG tablet     ketotifen (ALAWAY) 0.025 % ophthalmic solution Place 1 drop into both eyes 2 times daily.    loperamide (IMODIUM) 2 MG capsule For mild to moderate diarrhea (less than 5 BMs within 24 hours):  Take 1 capsule (2 mg) by mouth 4 times daily as needed for Diarrhea.  For severe Diarrhea (5 or more medium to large BMs within 24 hours):  take 2 capsules and then 1 capsule every 2 hours until diarrhea stops.    loratadine (CLARITIN REDITABS) 10 MG dissolvable tablet     LORazepam (ATIVAN) 0.5 MG tablet Take 1 tablet  by mouth 30 to 60 minutes prior to MRI.  May repeat dose once if needed.    Multiple Vitamins-Minerals (MULTIVITAMIN WITH MINERALS) TABS tablet Take 1 tablet by mouth daily.    naloxone (NARCAN) 4 mg/0.1 mL nasal spray For suspected opioid overdose, call 911! Then spray once in one nostril. Repeat after 3 minutes if no or minimal response using a new spray in other nostril.    Nutritional Supplements (NEPRO) LIQD Take 3 Cans by mouth daily.    polyethylene glycol (MIRALAX) 17 g packet Take 1 packet (17 g) by mouth 2 times daily as needed for Constipation.    pregabalin (LYRICA) 100 MG capsule Take 1 capsule (100 mg) by mouth 2 times daily.    prochlorperazine (COMPAZINE) 10 MG tablet Take 1 tablet (10 mg) by mouth every 6 hours as needed  (Nausea/Vomiting).    senna (SENOKOT) 8.6 MG tablet Take 2 tablets (17.2 mg) by mouth 2 times daily.    tizanidine (ZANAFLEX) 2 MG tablet Take 1 tablet (2 mg) by mouth every 8 hours as needed (muscle spasm).    traZODone (DESYREL) 50 MG tablet Take one tab nightly by mouth at bedtime as needed for insomnia     No current facility-administered medications for this visit.         Review of Systems:  CONSTITUTIONAL: No unexpected weight loss, fevers, chills, or anorexia. SKIN: Noncontributory. GENITOURINARY: Symptoms as noted above in HPI. NEUROLOGIC: Symptoms as noted above in HPI. MUSCULOSKELETAL: Symptoms as noted above in HPI.    Physical Examination:  BP (!) 115/57 (BP Location: Left arm, BP Patient Position: Sitting, BP cuff size: Regular)   Pulse 55   Temp 98.5 F (36.9 C) (Temporal)   Ht 5\' 6"  (1.676 m)   Wt 73.5 kg (162 lb)   BMI 26.15 kg/m   CONSTITUTIONAL: Well appearing and well groomed. PSYCHOLOGICAL: Alert and oriented and appropriate to situation. INTEGUMENT: Intact. VASCULAR: Radial and pedal pulses are intact and symmetric. EXTREMITIES: Bilateral upper and lower extremities have good range of motion and no significant deformities. GAIT: Brisk with good coordination.     CERVICAL SPINE: Nontender to palpation. Spurling's test negative. Sensation intact of the upper extremities.    RANGE OF MOTION (in degrees)       RT LT  Lateral motion   80  80  Lateral bend   45  45  Flexion 40 degrees. Extension 35 degrees. Motion in all planes is nonpainful.    MOTOR    RT LT  Trapezius   5/5  5/5  Deltoid    5/5  5/5  Bicep    5/5  4/5  Tricep    5/5  5/5  Wrist flexor   5/5  5/5  Wrist extensor  5/5  5/5  Intrinsics   5/5  4/5  Grip    5/5  4/5    DEEP TENDON REFLEXES       RT  LT  Bicep    2+  2+  Brachioradialis  2+  2+  Tricep    2+  2+    THORACIC SPINE  Nontender to percussion. Alignment - normal, no paravertebral muscle fullness. Sensation intact. Beevor's negative. Abdominal reflexes are absent and  symmetric.     LUMBAR SPINE  Nontender to percussion. Heel to toe walk without deficit. Sensation intact of the lower extremities. Trendelenburg sign negative. Skin intact, incisions well healed.    RANGE OF MOTION  RT  LT  lateral bend, normal at 25 degrees 25  25  rotation, normal at 30 degrees 30  30  Flexion - normal at 40 degrees. Extension - normal at 10 degrees.    MOTOR STRENGTH       RT  LT  Iliopsoas   4/5  5-/5  Quadricep   4+/5  5/5  Anterior tibialis   5/5  5/5  Extensor hallucis longus 5/5  5/5  Gastrocsoleus  5/5  5/5    DEEP TENDON REFLEXES       RT  LT  Patellar   2+  2+  Achilles   2+  2+    PATHOLOGIC REFLEXES      RT LT  Clonus    Neg Neg  Babinski   Neg Neg  Hoffmans   Neg Neg    STRAIGHT LEG RAISING       RT  LT  Sitting @ 90 degrees   Neg Neg    IMAGING STUDIES:     EXAM DESCRIPTION:  MRI CERVICAL SPINE WO/W IV CONTRAST: 06/23/2023 11:59 am     CLINICAL HISTORY:  "Cancer metastatic to bone suspected; Cervical radicular pain; significant worsening neck pain, hx of metastatic urothelial carcinoma."     TECHNIQUE:  Multiplanar, multisequence MRI of the cervical spine was performed without and with contrast on a 1.5 Tesla scanner. Limited sagittal postcontrast sequences.     Contrast: 7 mL gadobutrol IV.     COMPARISON:  PET-CT from 04/26/2023. MRI cervical spine from 06/04/2022, 05/07/2022, and 05/01/2018.     FINDINGS:  Posterior Fossa and Craniocervical Junction: No abnormality.     Alignment: Redemonstration of straightening normal cervical lordosis.     Vertebrae: Vertebral body heights are intact. Mild edema in the right pedicle/facet at C5-C6, which likely stress/reactive related. Edema and enhancement involving the left facet at C4-C5, more conspicuous from prior exam. Trace adjacent paraspinal enhancement without abscess or other inflammatory changes.  A few T2 hypointense lesions/marrow replacing, including 10 mm anteroinferior C3, 6 mm in C7, 10 mm lesion along the left aspect of T1  adjacent to the pedicle, and 11 mm lesions in the inferior posterior aspect and right aspect of T2. Limited evaluation of postcontrast on sagittal imaging.     Discs, Spinal Canal, and Neural Foramina: Intervertebral disc height loss more pronounced at C5-C6, C6-C7, and C7-T1, and T1-T2. Degenerative changes and spinal stenosis as described below by disc level.     -C2-C3: No significant spondylosis. No spinal canal stenosis. No neuroforaminal stenosis.     -C3-C4: Severe left facet arthropathy. No significant spinal canal stenosis. Severe left foraminal stenosis. No significant right foraminal stenosis.     -C4-C5: Posterior disc osteophyte complex, uncovertebral joints hypertrophy, and severe left facet arthropathy. Moderate spinal canal stenosis with abutment of the ventral cord. Severe left foraminal stenosis. No significant right foraminal stenosis.     -C5-C6: Posterior disc osteophyte complex, uncovertebral joints hypertrophy, mild facet arthropathy. Moderate spinal canal stenosis with abutment of the ventral cord. Severe left-greater-than-right foraminal stenosis.     -C6-C7: Posterior disc osteophyte complex, uncovertebral joints hypertrophy, and moderate left facet arthropathy. Mild spinal canal stenosis. Moderate-severe right, and moderate left foraminal stenosis.     -C7-T1: No significant spondylosis. No spinal canal stenosis. No neuroforaminal stenosis.     -Visualized Thoracic Spine: No significant spondylosis. No spinal canal stenosis. No neuroforaminal stenosis.     Spinal Cord: No abnormality.     Soft Tissues: No  abnormality.     CONCURRENT SUPERVISION:  I have reviewed the images and agree with the fellow's interpretation.           Preliminary created by: Glade Lloyd   Signed by: Nils Pyle 06/28/2023 21:46:21  IMPRESSION:  IMPRESSION:  1.  Scattered marrow replacing lesions within the cervical spine compatible with metastasis without pathologic fracture or epidural component.     2.  Mildly progressed multilevel degenerative changes of the cervical spine, with moderate spinal canal stenosis at C4-C5, and C5-C6 with abutment of the ventral cord. No definite underlying abnormal cord signal.     3. Multilevel foraminal stenosis, up to severe left at C3-C4, C4-C5, and bilateral at C5-C6, and on the right at C6-C7.     5. Multilevel facet arthropathy, with increased enhancement along the left facet at C4-C5, favored to be degenerative/inflammatory.    EXAM DESCRIPTION:  MRI LUMBAR SPINE WO/W IV CONTRAST: 04/16/2023 8:41 am     CLINICAL HISTORY:  72 year old male with a history of urothelial carcinoma now with new and worsening back pain     TECHNIQUE:  MRI of the lumbar spine was performed without and with intravenous contrast on a 1.5 Tesla magnet with acquisition of the following sequences: Localizer, sagittal T1, T2 and STIR. Axial T1 and T2. Following the administration of 7 mL of Gadavist intravenous contrast material, axial and sagittal fat saturated T1 weighted images were obtained.     COMPARISON:  MRI of the lumbar spine dated 12/03/2021     FINDINGS:  Redemonstrated is the L2 through S1 bilateral transpedicular fixation with X lift interbody metallic cage at E4/5 and X lift interbody PEEK cages L3/4 and L4/5 and ALIF at L5/S1. There is no evidence of an occult fracture or bone marrow bruise. There is persistent evidence of postoperative fluid collection multiloculated in character measuring 5.7 x 2.1 cm. The right kidney is absent.     Since the previous examination of 12/03/2021 multiple discrete osseous lesions have developed within the spine and pelvis. Multiple lesions are noted in the ileum bilaterally. Osseous lesions are noted in the T11, T12 and L1 vertebral bodies. Multiple lesions are noted in the ileum and sacrum bilaterally.  The osseous lesions do not appear to enhance following contrast material.     The abdominal aorta and inferior vena cava appear normal     T11-T12: There is  degeneration of the intervertebral disc space without significant central or foraminal stenosis.     T12-L1: There is degeneration and desiccation of the intervertebral disc space without significant central or foraminal stenosis.     L1-L2:  There is degeneration and desiccation of the intervertebral disc space with a mild retrolisthesis. No significant central or foraminal stenosis is noted.     L2-L3:  Postoperative changes are noted without significant central or foraminal stenosis.     L3-L4:  Postoperative changes are noted without significant central stenosis. Moderate right and mild left foraminal stenosis is noted.     L4-L5: Postoperative changes are noted without significant central or foraminal stenosis.     L5-S1: Postoperative changes are noted with a grade 1 anterior spondylolisthesis. No significant central or foraminal stenosis is noted     CONCURRENT SUPERVISION:  I have reviewed the images and agree with the fellow's interpretation.           Signed by: Vedia Coffer 04/17/2023 08:59:15  IMPRESSION:  IMPRESSION:  Since the previous examination of 12/03/2021 multiple discrete osseous lesions have developed  within the spine and pelvis.     Multiple lesions are noted in the ileum bilaterally. Vertebral lesions are noted in the T11, T12 and L1 multiple lesions are noted.      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      Plain radiographs lumbar spine shows:  FINDINGS:   There are five lumbar type vertebrae.     Redemonstrated is the L2 through S1 bilateral transpedicular fixation with X lift interbody metallic cage at Z6/1 and X lift interbody PEEK cages L3/4 and L4/5 and ALIF at L5/S1. Alignment is maintained. No significant subsidence. Similar degree of anterolisthesis of L5 on S1. There is also been posterior decompression L3 and L4. Adjacent level disc space unchanged.     Probable left nephrostomy tube.     IMPRESSION:  Alignment  maintained, no complications.    OTHER MEDICAL RECORDS/IMAGING STUDIES REVIEWED: None    IMPRESSION:   Encounter Diagnoses   Name Primary?    S/P lumbar fusion Yes    Cervical spondylosis         PLAN/DISCUSSION: Reviewed clinical history as well as exam results and imaging findings with patient in detail.     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 We will obtain an MRI thoracic spine to further evaluate these lesions. At this time we would also  recommend continued PT and injections as well as continued observation, and have provided a referral to pain management for evaluation for further injections.    FOLLOWUP: Return to clinic: three months. The patient is encouraged to call us with any questions or problems in the interim. Our contact numbers were given to the patient.

## 2023-07-03 NOTE — Telephone Encounter (Signed)
[name] Progress Note    Received refill request for pregabalin 100mg  PO BID.  Patient's last appointment with our team was on 06/26/23 and patient's next appointment with our team is on 08/14/23.  Patient has Narcan PRN on file.  Reviewed CURES, per CURES, last picked up lorazepam 0.5mg  #2 on 06/20/23, hydromorphone #60 on 06/20/23, pregabalin 100mg  #60 on 05/29/23 .  No aberrancies noted.  Noted that Leanor Rubenstein NP already sent refill to [facility], will have team reach out to patient to let him know.    Shea Azaiah Licciardi, MD  Attending Physician  [facility]

## 2023-07-03 NOTE — Interdisciplinary (Signed)
Pt seen for nephrostomy tube bag change.  Detached old bag and replaced with new bag.

## 2023-07-03 NOTE — Telephone Encounter (Signed)
Refill request     Lyrica 100 mg take BID # 60 last filled 6/10    Last visit 7/8 Dr. Logan Bores  Next visit 8/26 Dr. Logan Bores    Routing to provider

## 2023-07-04 ENCOUNTER — Encounter (HOSPITAL_BASED_OUTPATIENT_CLINIC_OR_DEPARTMENT_OTHER): Payer: Self-pay | Admitting: Hematology & Oncology

## 2023-07-04 NOTE — Telephone Encounter (Signed)
From: [name]  To: Brock Ra, MD  Sent: 07/04/2023 10:06 AM PDT  Subject: MRI schedule     not able to contact anyone to make an PET scan appointment only on line appointment are after August first/MR# [id number]

## 2023-07-06 ENCOUNTER — Ambulatory Visit (HOSPITAL_BASED_OUTPATIENT_CLINIC_OR_DEPARTMENT_OTHER): Admit: 2023-07-06 | Discharge: 2023-07-06 | Payer: Medicare Other

## 2023-07-06 ENCOUNTER — Ambulatory Visit (HOSPITAL_BASED_OUTPATIENT_CLINIC_OR_DEPARTMENT_OTHER): Payer: Medicare Other

## 2023-07-06 ENCOUNTER — Encounter (HOSPITAL_BASED_OUTPATIENT_CLINIC_OR_DEPARTMENT_OTHER): Payer: Self-pay | Admitting: Hematology & Oncology

## 2023-07-06 ENCOUNTER — Ambulatory Visit
Admission: RE | Admit: 2023-07-06 | Discharge: 2023-07-06 | Disposition: A | Discharge status: Home / Self Care | Payer: Medicare Other | Attending: Physician Assistant | Admitting: Physician Assistant

## 2023-07-06 ENCOUNTER — Telehealth (HOSPITAL_BASED_OUTPATIENT_CLINIC_OR_DEPARTMENT_OTHER): Payer: Self-pay

## 2023-07-06 VITALS — BP 127/57 | HR 55 | Temp 97.4°F | Resp 18 | Wt 158.5 lb

## 2023-07-06 VITALS — BP 126/76 | HR 62 | Temp 96.4°F | Resp 16 | Ht 66.14 in | Wt 158.5 lb

## 2023-07-06 DIAGNOSIS — C679 Malignant neoplasm of bladder, unspecified: Secondary | ICD-10-CM | POA: Insufficient documentation

## 2023-07-06 DIAGNOSIS — R21 Rash and other nonspecific skin eruption: Secondary | ICD-10-CM | POA: Insufficient documentation

## 2023-07-06 DIAGNOSIS — C662 Malignant neoplasm of left ureter: Secondary | ICD-10-CM

## 2023-07-06 DIAGNOSIS — C799 Secondary malignant neoplasm of unspecified site: Secondary | ICD-10-CM | POA: Insufficient documentation

## 2023-07-06 DIAGNOSIS — Z95828 Presence of other vascular implants and grafts: Secondary | ICD-10-CM | POA: Insufficient documentation

## 2023-07-06 DIAGNOSIS — Z5189 Encounter for other specified aftercare: Secondary | ICD-10-CM

## 2023-07-06 DIAGNOSIS — Z5112 Encounter for antineoplastic immunotherapy: Secondary | ICD-10-CM | POA: Insufficient documentation

## 2023-07-06 LAB — COMPREHENSIVE METABOLIC PANEL, BLOOD
ALT (SGPT): 11 U/L (ref 0–41)
AST (SGOT): 18 U/L (ref 0–40)
Albumin: 4.3 g/dL (ref 3.5–5.2)
Alkaline Phos: 109 U/L (ref 40–129)
Anion Gap: 7 mmol/L (ref 7–15)
BUN: 28 mg/dL — ABNORMAL HIGH (ref 8–23)
Bicarbonate: 32 mmol/L — ABNORMAL HIGH (ref 22–29)
Bilirubin, Tot: 0.43 mg/dL (ref <1.2)
Calcium: 10.3 mg/dL (ref 8.5–10.6)
Chloride: 102 mmol/L (ref 98–107)
Creatinine: 1.14 mg/dL (ref 0.67–1.17)
Glucose: 91 mg/dL (ref 70–99)
Potassium: 4.7 mmol/L (ref 3.5–5.1)
Sodium: 141 mmol/L (ref 136–145)
Total Protein: 7.6 g/dL (ref 6.0–8.0)
eGFR Based on CKD-EPI 2021 Equation: >60 mL/min/{1.73_m2}

## 2023-07-06 LAB — CBC WITH DIFF, BLOOD
ANC-Automated: 5.4 10*3/uL (ref 1.6–7.0)
ANC-Instrument: 5.4 10*3/uL (ref 1.6–7.0)
Abs Basophils: 0 10*3/uL (ref <0.2)
Abs Eosinophils: 0.2 10*3/uL (ref 0.0–0.5)
Abs Lymphs: 1.7 10*3/uL (ref 0.8–3.1)
Abs Monos: 0.6 10*3/uL (ref 0.2–0.8)
Basophils: 0.5 %
Eosinophils: 2.2 %
Hct: 34 % — ABNORMAL LOW (ref 40.0–50.0)
Hgb: 11.3 g/dL — ABNORMAL LOW (ref 13.7–17.5)
Imm Gran %: 0.4 % (ref <1)
Lymphocytes: 21.4 %
MCH: 29.7 pg (ref 26.0–32.0)
MCHC: 33.2 g/dL (ref 32.0–36.0)
MCV: 89.5 um3 (ref 79.0–95.0)
MPV: 9.4 fL (ref 9.4–12.4)
Monocytes: 7.5 %
Plt Count: 272 10*3/uL (ref 140–370)
RBC: 3.8 10*6/uL — ABNORMAL LOW (ref 4.60–6.10)
RDW: 15.2 % — ABNORMAL HIGH (ref 12.0–14.0)
Segs: 68 %
WBC: 7.9 10*3/uL (ref 4.0–10.0)

## 2023-07-06 MED ORDER — SODIUM CHLORIDE 0.9 % IJ SOLN (CUSTOM)
20.0000 mL | INTRAMUSCULAR | Status: DC | PRN
Start: 2023-07-06 — End: 2023-07-06
  Administered 2023-07-06 (×2): 20 mL via INTRAVENOUS

## 2023-07-06 MED ORDER — SODIUM CHLORIDE 0.9 % IJ SOLN (CUSTOM)
20.0000 mL | INTRAMUSCULAR | Status: DC | PRN
Start: 2023-07-06 — End: 2023-07-06
  Administered 2023-07-06: 20 mL via INTRAVENOUS

## 2023-07-06 MED ORDER — SODIUM CHLORIDE 0.9 % IV SOLN
Freq: Once | INTRAVENOUS | Status: AC
Start: 2023-07-06 — End: 2023-07-06

## 2023-07-06 MED ORDER — LIDOCAINE HCL (PF) 1 % IJ SOLN
0.3000 mL | INTRAMUSCULAR | Status: DC | PRN
Start: 2023-07-06 — End: 2023-07-06

## 2023-07-06 MED ORDER — SODIUM CHLORIDE 0.9 % IV SOLN
50.0000 mg | Freq: Once | INTRAVENOUS | Status: AC
Start: 2023-07-06 — End: 2023-07-06
  Administered 2023-07-06: 50 mg via INTRAVENOUS
  Filled 2023-07-06: qty 30

## 2023-07-06 MED ORDER — HEPARIN SODIUM LOCK FLUSH 100 UNIT/ML IJ SOLN CUSTOM
500.0000 [IU] | INTRAVENOUS | Status: DC | PRN
Start: 2023-07-06 — End: 2023-07-06
  Administered 2023-07-06: 500 [IU] via INTRAVENOUS

## 2023-07-06 MED ORDER — ONDANSETRON HCL 4 MG/2ML IV SOLN
8.0000 mg | Freq: Once | INTRAMUSCULAR | Status: AC
Start: 2023-07-06 — End: 2023-07-06
  Administered 2023-07-06: 8 mg via INTRAVENOUS
  Filled 2023-07-06: qty 4

## 2023-07-06 NOTE — Interdisciplinary (Deleted)
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  Patient has multiple co-morbidities that pose substantial risk if exposed to COVID-19.       MD Assessment:  Stephen [name] is a 72 year old male with OA, HTN, MDD with L UTUC with metastatic disease ([id number]), with osseous mets (Pet/CT with diffuse enhancement and discrete lesions).     Patient presents today prior to D8 EV. Still struggling with a variety of issues, however, we have a comprehensive plan in place to address the following: Pain management per Palliative Care - pt encouraged to restart methadone/pain meds per their recommendations. Pill box to facilitate med organization. Palliative XRT to R glenohumeral met s/p SIM on 2/07; possible start date next week. PCN exchange planned for 2/14. Suspect nausea may be due to constipation - start docusate bid and cont senna for BM's. Port placement planned for 2/26. RTC in 2 weeks with Dr. Roseanne Reno prior to C2   ______________________________________________________________________  ______________________________________________________________________      Chief Complaint [Nutritional]: Nutritional Optimization    RD Assessment: Pt presents unaccompanied to clinic visit. Pt reports improvement in appetite since last visit, making an effort to eat small amounts throughout the day, and his sister is helping his with preparing meals and snacks for him. He continues to eat soft foods d/t not being able to wear his dentures  caused by changes in his mouth r/t his weight loss. Pt also reports neck and throat tightness that he occasionally experiences causing his throat to "shut down" and he isn't able to eat when this happens, relates to increased stress levels, pt has appointment w/ SLP next week. Ongoing dysgeusia, specifically coffee doesn't takes good anymore.     Recall:   B: banana, yogurt, cereal and [place] juice  SN: Nepro  Also snacks throughout the day on: cottage cheese, pudding, apple sauce, sandwiches  Fluids: states he is not drinking a lot of water, is doing more sprite, cranberry juice and Gatorade     Pt trying to incorporate activity as able to support strength regain, takes his dog for short walks most days.     Pt mentioned increased anxiety in general and is actively working w/ medical team for antianxiety rx. Pt open to having SW reach out.     Reviewed importance of preventing further weight loss during treatment. Encouraged small frequent meals every 2-3 hours throughout the day and choosing high calorie high protein food and liquids choices to aid in meeting nutrient needs and maximizing intake. Recommended increasing to 3 nepro/day to aid in meeting nutrient needs. Reviewed importance of adequate hydration, encouraged liquids w/ calories such as pedilyte, whole milk, etc. Encouraged pt to continue supportive medication management as prescribed to aid in symptom management as able. Pt v/u, will continue to follow closely during treatment.     ***         GI: Denies N/V/D/C, last BM was this morning. Reports he continues  to lean more towards constipation/slow bowel movements. Pt reports improvement in pain but continues to take oxycodone daily.       Diet Type: oral    Estimated Nutrition Needs:       Calories: 2250-2625 kcal (30-35 kcal/kg)     Protein: 90-112 gm (1.2-1.5 gm/kg)     Fluids: 1 mL/kcal OR per MD            Food allergies/intolerances: NKFA     Supplements: MVI    Education / Intervention:  Focus on  scheduled eating pattern of small freq meals every 2-3 hours throughout the day  Choose high calorie high protein foods to maximize intake  Recommend oral nutrition supplement to support meeting nutrient needs: Nepro 3x/day as warranted   Continue working on hydration status  Referral message sent to SW    Goals: - Pt to demonstrate intake of >80% estimated needs with good tolerance as evidenced by pt report.        ____________________________________________________________________________________________________________________________________________    Vitals:  Ht:   Height - Most Recent Measurement   07/06/23 5' 6.14" (1.68 m)      Wt.:    Pt presents with additional wt loss, reports weight  recently fluctuating between: 154#, - 156#  02/23/23: pt reports ~20# weight loss in the last month (significant 12.5% weight loss in 1 month)  Wt Readings from Last 1 Encounters:   07/06/23 71.9 kg (158 lb 8.2 oz)           BMI: Estimated body mass index is 25.48 kg/m as calculated from the following:    Height as of an earlier encounter on 07/06/23: 5' 6.14" (1.68 m).    Weight as of an earlier encounter on 07/06/23: 71.9 kg (158 lb 8.2 oz).  Wt hx: UBW: 184#  Wt Readings from Last 10 Encounters:   07/06/23 71.9 kg (158 lb 8.2 oz)   07/06/23 71.9 kg (158 lb 8.2 oz)   07/04/23 72.6 kg (160 lb)   07/03/23 73.5 kg (162 lb)   06/30/23 73.5 kg (162 lb)   06/29/23 71.6 kg (157 lb 13.6 oz)   06/29/23 71.7 kg (158 lb 2.9 oz)   06/29/23 71.6 kg (157 lb 13.6 oz)   06/26/23 72.8 kg (160 lb 6.4 oz)   06/20/23 70.8 kg (156 lb)         Labs:  Lab Results   Component Value Date    WBC 7.9 07/06/2023    RBC 3.80 (L) 07/06/2023    HGB 11.3 (L) 07/06/2023    HCT 34.0 (L) 07/06/2023    MCV 89.5 07/06/2023    MCHC 33.2 07/06/2023    RDW 15.2 (H) 07/06/2023    PLT 272 07/06/2023    MPV 9.4 07/06/2023    ABSNEUTRO 5.4 07/06/2023    IANC 5.4 07/06/2023     Lab Results   Component Value Date    NA 141 07/06/2023    K 4.7 07/06/2023    CL 102  07/06/2023    BICARB 32 (H) 07/06/2023    BUN 28 (H) 07/06/2023    CREAT 1.14 07/06/2023    GLU 91 07/06/2023    [place] 10.3 07/06/2023     Lab Results   Component Value Date    AST 18 07/06/2023    ALT 11 07/06/2023    ALK 109 07/06/2023    TP 7.6 07/06/2023    ALB 4.3 07/06/2023    TBILI 0.43 07/06/2023     Lab  Results   Component Value Date    PHOS 4.1 04/27/2023    MG 1.8 04/27/2023         Medications/supplements:  Current Outpatient Medications   Medication Sig    acetaminophen (TYLENOL) 500 MG tablet Take 2 tablets (1,000 mg) by mouth every 8 hours as needed for Mild Pain (Pain Score 1-3) or Moderate Pain (Pain Score 4-6).    albuterol (PROAIR HFA) 108 (90 Base) MCG/ACT inhaler ProAir HFA 90 mcg/actuation aerosol inhaler    allopurinol (ZYLOPRIM) 100 MG tablet Take 1 tablet (100 mg) by mouth daily.    Artificial Tear Solution (SOOTHE XP OP)     busPIRone (BUSPAR) 10 MG tablet TAKE 1 TAB TWICE DAILY    diclofenac (VOLTAREN) 1 % gel Apply 2 g topically every 6 hours as needed (Right knee pain).    DULoxetine (CYMBALTA) 20 MG CR capsule Take 1 capsule (20 mg) by mouth 2 times daily.    fluticasone propionate (FLONASE) 50 MCG/ACT nasal spray 2 sprays.    HYDROmorphone (DILAUDID) 2 MG tablet Take 1 tablet (2 mg) by mouth every 4 hours as needed for Moderate Pain (Pain Score 4-6) or Severe Pain (Pain Score 7-10).    hydrOXYzine HCL (ATARAX) 25 MG tablet Take 1 tablet (25 mg) by mouth every 8 hours as needed for Anxiety.    ibuprofen (MOTRIN) 600 MG tablet     ketotifen (ALAWAY) 0.025 % ophthalmic solution Place 1 drop into both eyes 2 times daily.    loperamide (IMODIUM) 2 MG capsule For mild to moderate diarrhea (less than 5 BMs within 24 hours):  Take 1 capsule (2 mg) by mouth 4 times daily as needed for Diarrhea.  For severe Diarrhea (5 or more medium to large BMs within 24 hours):  take 2 capsules and then 1 capsule every 2 hours until diarrhea stops.    loratadine (CLARITIN REDITABS) 10 MG dissolvable tablet      LORazepam (ATIVAN) 0.5 MG tablet Take 1 tablet by mouth 30 to 60 minutes prior to MRI.  May repeat dose once if needed.    Multiple Vitamins-Minerals (MULTIVITAMIN WITH MINERALS) TABS tablet Take 1 tablet by mouth daily.    naloxone (NARCAN) 4 mg/0.1 mL nasal spray For suspected opioid overdose, call 911! Then spray once in one nostril. Repeat after 3 minutes if no or minimal response using a new spray in other nostril.    Nutritional Supplements (NEPRO) LIQD Take 3 Cans by mouth daily.    polyethylene glycol (MIRALAX) 17 g packet Take 1 packet (17 g) by mouth 2 times daily as needed for Constipation.    pregabalin (LYRICA) 100 MG capsule Take 1 capsule (100 mg) by mouth at bedtime.    prochlorperazine (COMPAZINE) 10 MG tablet Take 1 tablet (10 mg) by mouth every 6 hours as needed (Nausea/Vomiting).    senna (SENOKOT) 8.6 MG tablet Take 2 tablets (17.2 mg) by mouth 2 times daily.    tizanidine (ZANAFLEX) 2 MG tablet Take 1 tablet (2 mg) by mouth every 8 hours as needed (muscle spasm).    traZODone (DESYREL) 50 MG tablet Take one tab nightly by mouth at bedtime as needed for insomnia     No current facility-administered medications for this visit.     Facility-Administered Medications Ordered in Other Visits   Medication    heparin (HEP-LOCK) flush injection 500 Units    sodium chloride 0.9 % flush 20 mL          Past Medical /  Surgical History:  Past Medical History:   Diagnosis Date    Chronic back pain     Congenital hydronephrosis     Gout     Headache     Hematuria     HTN (hypertension) 12/10/2021    Kidney disease     Kidney stones     Major depressive disorder, single episode     Polyarthropathy or polyarthritis of multiple sites     Retinal detachment     Urethral stricture      Past Surgical History:   Procedure Laterality Date    CT INSERTION OF SUPRAPUBIC CATH  09/25/2015    NEPHRECTOMY Right 1955    APPENDECTOMY      COLONOSCOPY      CYSTOSCOPY      CYSTOSCOPY W/ LASER LITHOTRIPSY      OTHER SURGICAL  HISTORY      Interstim 01/29/2011    SPINE SURGERY  09/21    Lumbar-sacral fusion    TRANSURETHRAL RESECTION OF PROSTATE           Readiness to change: Ready  Expected Compliance: Good                             Barriers to Learning/Compliance: None  Pt. Verbalized understanding. Patient confirmed all questions and concerns were addressed to satisfaction, email provided for interim questions.  Monitoring and Evaluation:  - Monitor po intake/diet tolerance  - Monitor BM pattern  - Monitor weight changes.        Follow up next available on June 13th at 1:30PM in clinic ***      Riley Lam, RD

## 2023-07-06 NOTE — Interdisciplinary (Signed)
Patient arrived ambulatory with walker in stable condition. Denies nausea, vomiting, diarrhea, and constipation. Patient presents for central line draw for infusion.    Patient states he has been feeling very tired, and he stated this is unchanged from his last MD appointment. He also reports 8/10 pain which he takes Dilaudid for.    Right chest Port-A-Cath prepped and accessed per sterile protocol using 20G 3/4 inch Huber needle with positive blood return.  Labs drawn: 0826  Biopatch placed: No  Port flushed with 20ml NS and 500 units Heparin. Curos capped. Dressing applied.   Patient tolerated well and discharged ambulatory with walker in stable condition to lobby awaiting labs.   Infusion Triage RN to review lab results.    Medications   lidocaine (XYLOCAINE) 1% PF injection 0.3 mL (0.3 mL IntraDERMAL Not given 07/06/23 0834)   sodium chloride 0.9 % flush 20 mL (20 mL IntraVENOUS Given 07/06/23 0833)   heparin (HEP-LOCK) flush injection 500 Units (500 Units IntraVENOUS Given 07/06/23 0833)

## 2023-07-06 NOTE — Progress Notes (Signed)
SUBJECTIVE:  Stephen [name] is a 72 year old male with metastatic urothelial carcinoma who comes to the Infusion Center today for Cycle 7 Day 8 of his treatment with Padcev and Pembrolizumab. The patient reports to the Infusion Center nurse the presence of a rash that began on a couple of days ago.  I was asked to evaluate the patient.    The patient reports that ~2 days ago he noted a rash to his right leg and thigh area. The rash is localized to that area and is not present on any other part of the body. It is not itchy. The patient notes that since onset, it is already self-resolving. He denies any new medications or change in medications, denies new sheets, towels, linens, or clothes that were not laundered prior to wearing or using, denies new soaps, perfumes, shampoos, foods or travel in the last week, fever, chills, chest pain, shortness of breath nor has he had any feelings of throat constriction or airway compromise. He is unaccompanied at today's infusion visit.     OBJECTIVE:  VITAL SIGNS:  BP 143/70 (BP Location: Right arm, BP Patient Position: Sitting)   Pulse 51   Temp 97.2 F (36.2 C) (Temporal)   Resp 18   Ht 5' 6.14" (1.68 m)   Wt 71.9 kg (158 lb 8.2 oz)   SpO2 100%   BMI 25.48 kg/m     GENERAL:  Male in no acute distress.  Cooperative and able to move around without assistance.  Found sitting in infusion chair resting comfortably. HEENT:  Sclerae white.  Conjunctivae pink.  Buccal mucosa is pink and moist without lesions.  Posterior oropharynx is clear of erythema or exudate.  HEART:  Bradycardia with regular rhythm. Normal S1 and S2 without murmurs.  LUNGS:  Breathing is even and unlabored. No use of accessory muscles to breathe. Clear to auscultation in all fields without wheezes, crackles or rhonchi.  ABDOMEN: PCN tube in place with clear yellow drainage in bag.  EXTREMITIES:  No clubbing, cyanosis, or edema.  SKIN:  There is a flesh colored rash, isolated to the right thigh area.  The rash is not raised. No blisters or vesicles are noted. Skin is intact. This rash is not present on any other part of the body.     New rash to right thigh/groin area      LABORATORY STUDIES:   Lab Results   Component Value Date    WBC 7.9 07/06/2023    RBC 3.80 (L) 07/06/2023    HGB 11.3 (L) 07/06/2023    HCT 34.0 (L) 07/06/2023    MCV 89.5 07/06/2023    MCHC 33.2 07/06/2023    RDW 15.2 (H) 07/06/2023    PLT 272 07/06/2023    MPV 9.4 07/06/2023    SEG 68.0 07/06/2023    LYMPHS 21.4 07/06/2023    MONOS 7.5 07/06/2023    EOS 2.2 07/06/2023    BASOS 0.5 07/06/2023      Lab Results   Component Value Date    BUN 28 (H) 07/06/2023    CREAT 1.14 07/06/2023    CL 102 07/06/2023    NA 141 07/06/2023    K 4.7 07/06/2023    [place] 10.3 07/06/2023    TBILI 0.43 07/06/2023    ALB 4.3 07/06/2023    TP 7.6 07/06/2023    AST 18 07/06/2023    ALK 109 07/06/2023    BICARB 32 (H) 07/06/2023    ALT 11 07/06/2023  GLU 91 07/06/2023       ASSESSMENT AND PLAN:    72M with metastatic urothelial carcinoma, receiving treatment with Padcev and Pembrolizumab, reporting new onset rash localized to the right thigh and groin area, not itchy, no other associated symptoms, does not appear to be drug-mediated rash given appearance and distribution, but rather a topical irritant of some sort. VSS. The patient notes that the rash has already improved. He denies new medications and this does not appear to be a drug-mediated rash. It is possible that the patient has come into contact with a topical irritant given the isolated distribution. The patient does note that he is "due to deep clean and wash my sheets" and notes that he primarily sleeps on his right side. The patient was advised to monitor and should the rash persist, worsen, spread or other symptoms occur, to contact his primary care team for further advisement. The patient verbalized understanding of all. The patient received treatment as ordered and planned. He is otherwise due for F/U OV  with Dr. Roseanne Reno on 07/20/23.     Viviann Spare Annamary Carolin, MS, AGACNP-BC, AOCNP  [facility]- Infusion

## 2023-07-06 NOTE — Interdisciplinary (Signed)
Chemotherapy Nursing Note - [facility]    Stephen [name] is a 72 year old male who presents for chemotherapy cycle 7, day(s) 8 of enfortumab vedotin-ejfv (PADCEV) infusion.    Current Chemotherapy Signed Informed Consent verified/obtained yes    Pre-treatment nursing assessment:  Pt arrived ambulatory with walker to appointment, pt is alert and oriented x4. Pt denies nausea, vomiting, diarrhea and constipation. Pt denies SOB and cough. Pt states pain, no interventions requested at this time. Pt denies visual changes, muscle weakness, and tingling. Pt states numbness on feet and hands, per pt it is not new and it is not getting worse. Pt denies signs and symptoms of pneumonitis or interstitial lung disease. Pt states having a mild rash on right thigh. Mick Sell, APP to chairside to assess. Per Mick Sell, APP okay to proceed with treatment. See her note. Pt agreeable to todays treatment.        Patient met treatment parameters.    Lab Results   Component Value Date    ABSNEUTRO 5.4 07/06/2023    IANC 5.4 07/06/2023    WBC 7.9 07/06/2023    RBC 3.80 (L) 07/06/2023    HGB 11.3 (L) 07/06/2023    HCT 34.0 (L) 07/06/2023    MCV 89.5 07/06/2023    MCHC 33.2 07/06/2023    RDW 15.2 (H) 07/06/2023    PLT 272 07/06/2023       Lab Results   Component Value Date    NA 141 07/06/2023    K 4.7 07/06/2023    CL 102 07/06/2023    BICARB 32 (H) 07/06/2023    BUN 28 (H) 07/06/2023    CREAT 1.14 07/06/2023    GLU 91 07/06/2023    [place] 10.3 07/06/2023           Lab Results   Component Value Date    AST 18 07/06/2023    ALT 11 07/06/2023    GGT 131 (H) 01/17/2022    LDH 172 01/19/2022    ALK 109 07/06/2023    TP 7.6 07/06/2023    ALB 4.3 07/06/2023    TBILI 0.43 07/06/2023    DBILI <0.2 01/19/2022        Vitals:    07/06/23 0936 07/06/23 1127   BP: 143/70 126/76   BP Location: Right arm Right arm   BP Patient Position: Sitting Sitting   Pulse: 51 62   Resp: 18 16   Temp: 97.2 F (36.2 C) 96.4 F (35.8 C)   TempSrc: Temporal  Temporal   SpO2: 100% 99%   Weight: 71.9 kg (158 lb 8.2 oz)    Height: 5' 6.14" (1.68 m)      Pain Score: 8  Body surface area is 1.83 meters squared.  Body mass index is 25.48 kg/m.    ECOG  ECOG: 2 Not Applicable      PAC to RCW accessed in fast track. PAC flushed with positive blood return.     Chemotherapy:  Stephen [name] tolerated treatment well.    Post blood return: Brisk  IV access post infusion: NS/Heparin and PAC de-accessed.       Patient Education  Learner: Patient  Barriers to learning: No Barriers  Readiness to learn: Acceptance  Method: Explanation    Treatment Education:       Signs and symptoms of infection, bleeding, adverse reaction(s), symptom control, and when to notify MD.    Patient instructed on post-treatment care and precautions specific to drug(s) administered.  If applicable, education given on vesicant administration risk and signs and symptoms of extravasation (redness, pain, swelling, pressure at IV site) to report immediately to the chemotherapy RN.    Fall Prevention Education: Instructed patient to call for assistance, call light within reach.   Pain Education: Patient instructed to contact nurse if pain should develop or if their current pain therapy becomes ineffective.       Treatment education provided to patient: yes    Response: Verbalizes understanding    Discharge Plan  Discharge instructions given to patient.  Future appointments:date given and reviewed with treatment plan. Next appointment 8/1.  Discharge Mode: Ambulatory with walker  Discharge Time: 1135    Accompanied by: Self  Discharged To: Home     Medications   sodium chloride 0.9 % flush 20 mL (20 mL IntraVENOUS Given 07/06/23 1130)   heparin (HEP-LOCK) flush injection 500 Units (500 Units IntraVENOUS Given 07/06/23 1130)   sodium chloride 0.9% infusion (0 mL IntraVENOUS Stopped 07/06/23 1130)   ondansetron (ZOFRAN) injection 8 mg (8 mg IntraVENOUS Given 07/06/23 1007)   enfortumab vedotin-ejfv (PADCEV) 50 mg in  sodium chloride 0.9 % 100 mL chemo infusion (0 mg IntraVENOUS IV Stop 07/06/23 1109)

## 2023-07-06 NOTE — Telephone Encounter (Signed)
Called pt for 3:30pm nutrition visit, no answer, left VM with callback number for pt to reschedule.     Evalina Field, RD, CSO

## 2023-07-07 ENCOUNTER — Other Ambulatory Visit (HOSPITAL_BASED_OUTPATIENT_CLINIC_OR_DEPARTMENT_OTHER): Payer: Self-pay | Admitting: Vascular & Interventional Radiology

## 2023-07-07 ENCOUNTER — Telehealth (HOSPITAL_BASED_OUTPATIENT_CLINIC_OR_DEPARTMENT_OTHER): Payer: Self-pay

## 2023-07-07 DIAGNOSIS — N1330 Unspecified hydronephrosis: Secondary | ICD-10-CM

## 2023-07-07 NOTE — Telephone Encounter (Signed)
Clinic still needed: No  LVM to schedule procedure: Neph Tube Check  Is time held: No

## 2023-07-10 ENCOUNTER — Other Ambulatory Visit: Payer: Self-pay

## 2023-07-10 ENCOUNTER — Ambulatory Visit: Payer: Medicare Other | Attending: Psychiatry | Admitting: Psychiatry

## 2023-07-10 ENCOUNTER — Encounter (HOSPITAL_BASED_OUTPATIENT_CLINIC_OR_DEPARTMENT_OTHER): Payer: Self-pay | Admitting: Hematology & Oncology

## 2023-07-10 DIAGNOSIS — C679 Malignant neoplasm of bladder, unspecified: Secondary | ICD-10-CM | POA: Insufficient documentation

## 2023-07-10 DIAGNOSIS — Z7409 Other reduced mobility: Secondary | ICD-10-CM | POA: Insufficient documentation

## 2023-07-10 DIAGNOSIS — F064 Anxiety disorder due to known physiological condition: Secondary | ICD-10-CM | POA: Insufficient documentation

## 2023-07-10 DIAGNOSIS — C689 Malignant neoplasm of urinary organ, unspecified: Secondary | ICD-10-CM | POA: Insufficient documentation

## 2023-07-10 DIAGNOSIS — F411 Generalized anxiety disorder: Secondary | ICD-10-CM | POA: Insufficient documentation

## 2023-07-10 DIAGNOSIS — M5416 Radiculopathy, lumbar region: Secondary | ICD-10-CM | POA: Insufficient documentation

## 2023-07-10 MED ORDER — BUSPIRONE HCL 15 MG OR TABS
15.0000 mg | ORAL_TABLET | Freq: Two times a day (BID) | ORAL | 1 refills | Status: DC
Start: 2023-07-10 — End: 2023-09-04

## 2023-07-13 ENCOUNTER — Encounter (HOSPITAL_BASED_OUTPATIENT_CLINIC_OR_DEPARTMENT_OTHER): Payer: Self-pay | Admitting: Family Medicine

## 2023-07-13 ENCOUNTER — Encounter (HOSPITAL_BASED_OUTPATIENT_CLINIC_OR_DEPARTMENT_OTHER): Payer: Self-pay | Admitting: Orthopaedic Surgery of the Spine

## 2023-07-13 DIAGNOSIS — C7951 Secondary malignant neoplasm of bone: Secondary | ICD-10-CM

## 2023-07-13 DIAGNOSIS — G893 Neoplasm related pain (acute) (chronic): Secondary | ICD-10-CM

## 2023-07-13 DIAGNOSIS — F40240 Claustrophobia: Secondary | ICD-10-CM

## 2023-07-14 ENCOUNTER — Other Ambulatory Visit: Payer: Self-pay

## 2023-07-14 MED ORDER — LORAZEPAM 0.5 MG OR TABS
ORAL_TABLET | ORAL | 0 refills | Status: DC
Start: 2023-07-14 — End: 2023-11-23

## 2023-07-14 MED ORDER — HYDROMORPHONE HCL 2 MG OR TABS
2.0000 mg | ORAL_TABLET | ORAL | 0 refills | Status: DC | PRN
Start: 2023-07-14 — End: 2023-08-09

## 2023-07-14 NOTE — Telephone Encounter (Signed)
[name] Progress Note    Received refill request for hydromorphone 2mg  PO q4h PRN pain.  Patient's last appointment with our team was on 06/26/23 and patient's next appointment with our team is on 08/14/23.  Patient has Narcan PRN on file.  Reviewed CURES, per CURES, last picked up pregabalin 100mg  #30 on 07/03/23, lorazepam 0.5mg  #2 on 06/20/23, hydromorphone 2mg  #60 on 06/20/23.  No aberrancies noted.  Will send refill as requested.  Will also send lorazepam 0.5mg  #2 tablets for him to use prior to MRI per his request.    Stephen Xxavier Noon, MD  Attending Physician  [facility] Palliative Care Service

## 2023-07-14 NOTE — Telephone Encounter (Signed)
[name] Palliative Care Note: Refill Request    Patient is requesting refill for Dilaudid 2mg  tabs     Remaining: Pending pt response ***    Last dispensed on: 7/2 for 60 tabs indicating pt is using 2-3 tabs/day     Last palliative care appt: 7/8 with Dr. Margarita Mail   Next palliative care appt: 8/26 with Dr. Margarita Mail     Preferred Pharmacy: [phone] -  - [address]    ----------    Plan   Route to team

## 2023-07-14 NOTE — Addendum Note (Signed)
Encounter addended by: Ulis Rias on: 07/14/2023 6:26 AM   Actions taken: Charge Capture section accepted

## 2023-07-14 NOTE — Addendum Note (Signed)
Encounter addended by: Ulis Rias on: 07/14/2023 6:24 AM   Actions taken: Charge Capture section accepted

## 2023-07-17 ENCOUNTER — Telehealth (HOSPITAL_BASED_OUTPATIENT_CLINIC_OR_DEPARTMENT_OTHER): Payer: Self-pay

## 2023-07-17 DIAGNOSIS — C662 Malignant neoplasm of left ureter: Secondary | ICD-10-CM

## 2023-07-17 NOTE — Telephone Encounter (Signed)
Dr. Roseanne Reno,     [name] is scheduled for infusion of Padcev + Pembrolizumab on 8/1.     The patient has a scheduled clinic visit on 8/1.    The following items are currently outstanding:    Orders are unsigned    *As a reminder of our infusion center process, if an unsigned order and/or consent are not addressed 1 business day before the infusion center appointment, the patient appointment will be cancelled.      Thank you,   Tyson Dense RN

## 2023-07-18 ENCOUNTER — Encounter (HOSPITAL_BASED_OUTPATIENT_CLINIC_OR_DEPARTMENT_OTHER): Payer: Self-pay | Admitting: Hematology & Oncology

## 2023-07-18 ENCOUNTER — Encounter (HOSPITAL_BASED_OUTPATIENT_CLINIC_OR_DEPARTMENT_OTHER): Payer: Self-pay | Admitting: Orthopaedic Surgery of the Spine

## 2023-07-18 NOTE — Telephone Encounter (Signed)
From: [name]  To: Brock Ra, MD  Sent: 07/18/2023 3:15 PM PDT  Subject: supplys    please get a little time to change my neph tube dsg I will have the supplies also I need some more drain bags thank you:/MR#[id number]

## 2023-07-18 NOTE — Telephone Encounter (Signed)
From: [name]  To: [name], MD  Sent: 07/13/2023 1:47 PM PDT  Subject: MRI results     please review my MRIs can't get my mid thoracic MRI until end of August:/MR#[id number] any suggestions appreciated

## 2023-07-18 NOTE — Progress Notes (Signed)
Attending Note:    Subjective:  I reviewed the history.  Patient interviewed and examined.  History of present illness (HPI):  Consultation (Stage 2 revision laminectomy and PSIF L3 thru S1, pain)     Review of Systems (ROS): As per the fellow's note.  Past Medical, Family, Social History:  As per the fellow's  note.    Objective:   I have examined the patient and I concur with the fellow's exam.  Assessment and plan reviewed with the fellow. I agree with the fellow's plan as documented.  See the fellow's note for further details.

## 2023-07-18 NOTE — Telephone Encounter (Signed)
Can try getting the MRI at [facility], might be sooner.

## 2023-07-19 ENCOUNTER — Ambulatory Visit
Admission: RE | Admit: 2023-07-19 | Discharge: 2023-07-19 | Disposition: A | Discharge status: Home / Self Care | Payer: Medicare Other | Attending: In Vivo & In Vitro Nuclear Medicine | Admitting: In Vivo & In Vitro Nuclear Medicine

## 2023-07-19 ENCOUNTER — Ambulatory Visit (HOSPITAL_BASED_OUTPATIENT_CLINIC_OR_DEPARTMENT_OTHER)
Admit: 2023-07-19 | Discharge: 2023-07-19 | Disposition: A | Discharge status: Home Health | Payer: Medicare Other | Attending: Hematology & Oncology | Admitting: Hematology & Oncology

## 2023-07-19 ENCOUNTER — Telehealth (HOSPITAL_BASED_OUTPATIENT_CLINIC_OR_DEPARTMENT_OTHER): Payer: Self-pay | Admitting: Hematology & Oncology

## 2023-07-19 DIAGNOSIS — R935 Abnormal findings on diagnostic imaging of other abdominal regions, including retroperitoneum: Secondary | ICD-10-CM

## 2023-07-19 DIAGNOSIS — C679 Malignant neoplasm of bladder, unspecified: Secondary | ICD-10-CM

## 2023-07-19 DIAGNOSIS — C7910 Secondary malignant neoplasm of unspecified urinary organs: Secondary | ICD-10-CM | POA: Insufficient documentation

## 2023-07-19 DIAGNOSIS — I898 Other specified noninfective disorders of lymphatic vessels and lymph nodes: Secondary | ICD-10-CM

## 2023-07-19 DIAGNOSIS — C688 Malignant neoplasm of overlapping sites of urinary organs: Secondary | ICD-10-CM | POA: Insufficient documentation

## 2023-07-19 LAB — GLUCOSE POCT, BLOOD: Glucose (POCT): 100 mg/dL — ABNORMAL HIGH (ref 70–99)

## 2023-07-19 MED ORDER — FLUDEOXYGLUCOSE F-18 IV KIT
10.4600 | PACK | Freq: Once | INTRAVENOUS | Status: AC
Start: 2023-07-19 — End: 2023-07-19
  Administered 2023-07-19: 10.46 via INTRAVENOUS
  Filled 2023-07-19: qty 10.46

## 2023-07-19 MED ORDER — HEPARIN SODIUM LOCK FLUSH 100 UNIT/ML IJ SOLN CUSTOM
500.0000 [IU] | Freq: Once | INTRAVENOUS | Status: AC | PRN
Start: 2023-07-19 — End: 2023-07-19
  Administered 2023-07-19: 500 [IU]
  Filled 2023-07-19: qty 5

## 2023-07-19 NOTE — Telephone Encounter (Signed)
Secure message sent in Epic to Radiology Scheduling and escalation group for final report for pet scan imaging prior to follow up.  Future Appointments   Date Time Provider Department Center   07/20/2023 11:00 AM FAST TRACK MUC FAST MUC   07/20/2023 12:00 PM Cheri Guppy, MD [facility]   07/20/2023  1:00 PM [facility] INFUSION/CTI [facility] Onc Chem MUC   07/26/2023  2:00 PM Jordan Hawks, MD [facility] RAD ONC [facility]   08/14/2023  2:00 PM Shea Evans, MD [facility] Onc [facility]   08/19/2023  8:00 AM [facility] MRI 2 [facility]

## 2023-07-20 ENCOUNTER — Ambulatory Visit
Admission: RE | Admit: 2023-07-20 | Discharge: 2023-07-20 | Disposition: A | Discharge status: Home / Self Care | Payer: Medicare Other | Attending: Radiation Oncology | Admitting: Radiation Oncology

## 2023-07-20 ENCOUNTER — Ambulatory Visit (HOSPITAL_BASED_OUTPATIENT_CLINIC_OR_DEPARTMENT_OTHER): Payer: Medicare Other | Admitting: Hematology & Oncology

## 2023-07-20 ENCOUNTER — Ambulatory Visit (HOSPITAL_BASED_OUTPATIENT_CLINIC_OR_DEPARTMENT_OTHER): Admit: 2023-07-20 | Discharge: 2023-07-20 | Payer: Medicare Other

## 2023-07-20 ENCOUNTER — Ambulatory Visit: Admit: 2023-07-20 | Discharge: 2023-07-20 | Payer: Medicare Other

## 2023-07-20 ENCOUNTER — Encounter (HOSPITAL_BASED_OUTPATIENT_CLINIC_OR_DEPARTMENT_OTHER): Payer: Self-pay | Admitting: Hematology & Oncology

## 2023-07-20 VITALS — BP 123/64 | HR 59 | Temp 98.0°F | Resp 16 | Ht 66.14 in | Wt 163.6 lb

## 2023-07-20 VITALS — BP 123/64 | HR 59 | Temp 98.0°F | Resp 20 | Ht 66.14 in | Wt 163.6 lb

## 2023-07-20 VITALS — BP 123/60 | HR 54 | Temp 97.6°F | Resp 16 | Ht 66.14 in | Wt 163.6 lb

## 2023-07-20 DIAGNOSIS — Z95828 Presence of other vascular implants and grafts: Secondary | ICD-10-CM | POA: Insufficient documentation

## 2023-07-20 DIAGNOSIS — Z5112 Encounter for antineoplastic immunotherapy: Secondary | ICD-10-CM | POA: Insufficient documentation

## 2023-07-20 DIAGNOSIS — C7951 Secondary malignant neoplasm of bone: Secondary | ICD-10-CM | POA: Insufficient documentation

## 2023-07-20 DIAGNOSIS — C662 Malignant neoplasm of left ureter: Secondary | ICD-10-CM

## 2023-07-20 DIAGNOSIS — C7910 Secondary malignant neoplasm of unspecified urinary organs: Secondary | ICD-10-CM

## 2023-07-20 DIAGNOSIS — Z5189 Encounter for other specified aftercare: Secondary | ICD-10-CM

## 2023-07-20 LAB — CORTISOL, BLOOD: Cortisol: 8.1 ug/dL

## 2023-07-20 LAB — CBC WITH DIFF, BLOOD
ANC-Automated: 3.8 10*3/uL (ref 1.6–7.0)
ANC-Instrument: 3.8 10*3/uL (ref 1.6–7.0)
Abs Basophils: 0 10*3/uL (ref <0.2)
Abs Eosinophils: 0.2 10*3/uL (ref 0.0–0.5)
Abs Lymphs: 1.8 10*3/uL (ref 0.8–3.1)
Abs Monos: 0.6 10*3/uL (ref 0.2–0.8)
Basophils: 0.6 %
Eosinophils: 2.7 %
Hct: 30.6 % — ABNORMAL LOW (ref 40.0–50.0)
Hgb: 10.2 gm/dL — ABNORMAL LOW (ref 13.7–17.5)
Imm Gran %: 0.3 % (ref <1)
Lymphocytes: 28.2 %
MCH: 30.1 pg (ref 26.0–32.0)
MCHC: 33.3 g/dL (ref 32.0–36.0)
MCV: 90.3 um3 (ref 79.0–95.0)
MPV: 9.7 fL (ref 9.4–12.4)
Monocytes: 9.4 %
Plt Count: 237 10*3/uL (ref 140–370)
RBC: 3.39 10*6/uL — ABNORMAL LOW (ref 4.60–6.10)
RDW: 15.6 % — ABNORMAL HIGH (ref 12.0–14.0)
Segs: 58.8 %
WBC: 6.4 10*3/uL (ref 4.0–10.0)

## 2023-07-20 LAB — COMPREHENSIVE METABOLIC PANEL, BLOOD
ALT (SGPT): 12 U/L (ref 0–41)
AST (SGOT): 21 U/L (ref 0–40)
Albumin: 3.9 g/dL (ref 3.5–5.2)
Alkaline Phos: 113 U/L (ref 40–129)
Anion Gap: 12 mmol/L (ref 7–15)
BUN: 27 mg/dL — ABNORMAL HIGH (ref 8–23)
Bicarbonate: 28 mmol/L (ref 22–29)
Bilirubin, Tot: 0.5 mg/dL (ref <1.2)
Calcium: 9.9 mg/dL (ref 8.5–10.6)
Chloride: 99 mmol/L (ref 98–107)
Creatinine: 1.28 mg/dL — ABNORMAL HIGH (ref 0.67–1.17)
Glucose: 114 mg/dL — ABNORMAL HIGH (ref 70–99)
Potassium: 4.3 mmol/L (ref 3.5–5.1)
Sodium: 139 mmol/L (ref 136–145)
Total Protein: 6.7 g/dL (ref 6.0–8.0)
eGFR Based on CKD-EPI 2021 Equation: >60 mL/min/{1.73_m2}

## 2023-07-20 LAB — TSH, BLOOD: TSH: 1.71 u[IU]/mL (ref 0.27–4.20)

## 2023-07-20 LAB — FREE THYROXINE, BLOOD: Free T4: 1.1 ng/dL (ref 0.93–1.70)

## 2023-07-20 MED ORDER — SODIUM CHLORIDE 0.9 % IJ SOLN (CUSTOM)
20.0000 mL | INTRAMUSCULAR | Status: DC | PRN
Start: 2023-07-20 — End: 2023-07-20
  Administered 2023-07-20: 20 mL via INTRAVENOUS

## 2023-07-20 MED ORDER — HEPARIN SODIUM LOCK FLUSH 100 UNIT/ML IJ SOLN CUSTOM
500.0000 [IU] | INTRAVENOUS | Status: DC | PRN
Start: 2023-07-20 — End: 2023-07-20
  Administered 2023-07-20: 500 [IU] via INTRAVENOUS

## 2023-07-20 MED ORDER — SODIUM CHLORIDE 0.9 % IV SOLN
Freq: Once | INTRAVENOUS | Status: AC
Start: 2023-07-20 — End: 2023-07-20

## 2023-07-20 MED ORDER — SODIUM CHLORIDE 0.9 % IV SOLN
200.0000 mg | Freq: Once | INTRAVENOUS | Status: AC
Start: 2023-07-20 — End: 2023-07-20
  Administered 2023-07-20: 200 mg via INTRAVENOUS
  Filled 2023-07-20: qty 8

## 2023-07-20 MED ORDER — ONDANSETRON HCL 4 MG/2ML IV SOLN
8.0000 mg | Freq: Once | INTRAMUSCULAR | Status: AC
Start: 2023-07-20 — End: 2023-07-20
  Administered 2023-07-20: 8 mg via INTRAVENOUS
  Filled 2023-07-20: qty 4

## 2023-07-20 MED ORDER — LIDOCAINE HCL (PF) 1 % IJ SOLN
0.3000 mL | INTRAMUSCULAR | Status: DC | PRN
Start: 2023-07-20 — End: 2023-07-20
  Administered 2023-07-20: 0.3 mL via INTRADERMAL

## 2023-07-20 MED ORDER — SODIUM CHLORIDE 0.9 % IV SOLN
50.0000 mg | Freq: Once | INTRAVENOUS | Status: AC
Start: 2023-07-20 — End: 2023-07-20
  Administered 2023-07-20: 50 mg via INTRAVENOUS
  Filled 2023-07-20: qty 30

## 2023-07-20 NOTE — Addendum Note (Signed)
Encounter addended by: Myra Gianotti, RN on: 07/20/2023 4:38 PM   Actions taken: Flowsheet accepted

## 2023-07-20 NOTE — Interdisciplinary (Signed)
Chemotherapy Nursing Note - [facility]    Stephen [name] is a 72 year old male who presents for chemotherapy cycle 8, day(s) 1 of Padcev + Keytruda.    Current Chemotherapy Signed Informed Consent verified/obtained yes    Pre-treatment nursing assessment:  A&Ox4, walker, arrived to infusion in no acute distress. Denies any complaints at this time.  Pt denies CP, SOB, cough, fatigue, fevers, chills, headache or any dizziness/lightheadedness.  Denies any nausea, vomiting, diarrhea, or constipation.  No peripheral neuropathy.  Full ROM, skin is intact.    PAC access and labs drawn in fast track. Blood return verified prior to start of infusion. RN reviewed labs and determined pt met treatment parameters. Patient premedicated per treatment plan. Positive blood return noted in [facility] access.    Independent Double Check of chemotherapy/biotherapy/immunotherapy of all medications in the treatment plan for this date of service was performed to include: verification of provider order against drug label (drug, dose, route, patient name, MRN), height, weight and recalculation of body surface area (BSA). Dose, route and schedule of administration with chemotherapy reference text has also been verified. Dose being given is confirmed to be within recommended dose range and flush solution is compatible. See medication administration record for documentation of two competent verifiers.      Lab Results   Component Value Date    ABSNEUTRO 3.8 07/20/2023    IANC 3.8 07/20/2023    WBC 6.4 07/20/2023    RBC 3.39 (L) 07/20/2023    HGB 10.2 (L) 07/20/2023    HCT 30.6 (L) 07/20/2023    MCV 90.3 07/20/2023    MCHC 33.3 07/20/2023    RDW 15.6 (H) 07/20/2023    PLT 237 07/20/2023       Lab Results   Component Value Date    NA 139 07/20/2023    K 4.3 07/20/2023    CL 99 07/20/2023    BICARB 28 07/20/2023    BUN 27 (H) 07/20/2023    CREAT 1.28 (H) 07/20/2023    GLU 114 (H) 07/20/2023    [place] 9.9 07/20/2023           Lab Results    Component Value Date    AST 21 07/20/2023    ALT 12 07/20/2023    GGT 131 (H) 01/17/2022    LDH 172 01/19/2022    ALK 113 07/20/2023    TP 6.7 07/20/2023    ALB 3.9 07/20/2023    TBILI 0.50 07/20/2023    DBILI <0.2 01/19/2022        Vitals:    07/20/23 1057 07/20/23 1337 07/20/23 1553   BP:  112/62 123/60   BP Location:  Left arm    BP Patient Position:  Sitting    Pulse:  79 54   Resp:  17 16   Temp:  97.7 F (36.5 C) 97.6 F (36.4 C)   TempSrc:  Temporal    SpO2:  99% 99%   Weight: 74.2 kg (163 lb 9.3 oz) 74.2 kg (163 lb 9.3 oz)    Height: 5' 6.14" (1.68 m) 5' 6.14" (1.68 m)      Pain Score: 0  Body surface area is 1.86 meters squared.  Body mass index is 26.29 kg/m.    ECOG  ECOG: 2     PHQ2  PHQ2 Total: 0      Chemotherapy:  Medications   sodium chloride 0.9 % flush 20 mL (20 mL IntraVENOUS Given 07/20/23 1553)   heparin (  HEP-LOCK) flush injection 500 Units (500 Units IntraVENOUS Given 07/20/23 1553)   sodium chloride 0.9% infusion (0 mL IntraVENOUS Completed 07/20/23 1555)   ondansetron (ZOFRAN) injection 8 mg (8 mg IntraVENOUS Given 07/20/23 1346)   enfortumab vedotin-ejfv (PADCEV) 50 mg in sodium chloride 0.9 % 100 mL chemo infusion (0 mg IntraVENOUS IV Stop 07/20/23 1445)   pembrolizumab (KEYTRUDA) 200 mg in sodium chloride 0.9 % 100 mL infusion (0 mg IntraVENOUS Completed 07/20/23 1552)     Patietn had 30 minute observation after Padcev without incidence.   Stephen [name] tolerated treatment well.    Post blood return: Brisk  IV access post infusion: NS and Heparin 100 units/ml      Patient Education  Learner: Patient  Barriers to learning: No Barriers  Readiness to learn: Acceptance  Method: Explanation    Treatment Education:       Signs and symptoms of infection, bleeding, adverse reaction(s), symptom control, and when to notify MD.    Patient instructed on post-treatment care and precautions specific to drug(s) administered.   If applicable, education given on vesicant administration risk and signs and  symptoms of extravasation (redness, pain, swelling, pressure at IV site) to report immediately to the chemotherapy RN.    Fall Prevention Education: Instructed patient to call for assistance, call light within reach.   Pain Education: Patient instructed to contact nurse if pain should develop or if their current pain therapy becomes ineffective.       Treatment education provided to patient: yes    Response: Verbalizes understanding    Discharge Plan  Discharge instructions given to patient.  Future appointments:date given and reviewed with treatment plan.  Discharge Mode: Walker  Discharge Time: 1600    Accompanied by: Self  Discharged To: Home

## 2023-07-20 NOTE — Progress Notes (Signed)
Name: Stephen [name]   Date of Birth: 25-Jul-1951  Medical Record Number: [id number]    [facility]itour[facility]     Patient ID: Metastatic urothelial carcinoma    Stage: Cancer Staging   Ureter malignant neoplasm, left (CMS-HCC)  Staging form: Renal Pelvis And Ureter, AJCC 8th Edition  - Clinical: Stage IV (cTX, cN0, pM1) - Signed by Cheri Guppy, MD on 12/01/2022    Oncologic History:    04/22/21  CTU: filling defect in L renal pelvis  05/05/21  Cysto with L ureteroscopy revealed a L papillary tumor, biopsied: mixed high (30%) and low grade urothelial carcinoma  06/16/21  Repeat cysto, papillary tumor: pathology low grade papillary [place]  09/xx/22  Completed induction with mitogel (6 cycles)  08/19/21  HGTa of the bladder, no muscle  08/26/21  Repeat TURBT: NED, muscle identified  10/26/21  MRU: mild thickening in the L renal pelvis  12/24/21  Cysto/L ureteroscopy: dysplasia in the bladder; no disease in L ureter  05/06/22  L PCN placed  06/01/22  TURBT and L ureteroscopy: NED, stricture in place, stent placed  09/29/22  TURBT and L ureteroscopy: NED  10/22/22  MRU: Mild diffuse thickening in the L ureter/renal pelvis. Multiple osseous lesions involving the bilateral iliac crest and right acetabulum concerning for osseous metastatic disease.   11/26/22  PET/CT: diffuse osseous FDG activity with discrete osseous lesions concerning for metastatic disease.  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  Also treated for septic shock, AKI (Cr 2.5) and acute liver injury (AST/ALT 2800/2100).   S/p exchange L PCN.   02/07/23  start XRT to R Shoulder  02/10/23  C2D1 EV (dose reduced to 1.0 mg/kg) + pembro 200 mg  IV  03/03/23  C3D1 EV/pembro, missed D8  03/06/23  Cysto: NED.  Cytology NED.   03/14/23  Seen at [facility] ED for dislodged tube, replaced  04/02-05/24  Admitted with weakness, poor PO intake.   04/06/23  C4D1 EV/pembro  04/13/23  C4D8 EV  04/26/23  PET/CT: osseous lesions much improved, many new sclerotic lesions c/w treatment change.  Nonspecific mediastinal and pelvic LN  05/09-11/24  Admitted for treatment of AKI and rhabdomyolysis  05/11/23  C5D1 EV/P DR to 0.75  06/01/23  C6D1 EV/Pembro 0.75  06/23/23  MRI C-Spine: disease in spine, but no fracture or epidural involvement.  moderate spinal canal stenosis from DJD at C4-C5, and C5-C6 and abutment of ventral cord  06/29/23  C7D1  EV/Pembro 0.75   07/19/23  PET/CT: not read yet, but on my read, POD in multiple Lns and     Interim History:  Here for scan review and consideration of C8 EV/pembro  He's gaining weight, gained 6-8 lbs.  He is eating.  "My back is so bad"  L PCN is "doing well"  "I always have tingling" and "I always have tingling in my hands"  Using walker to walk. He has not had any more falls.     Review of Systems:   A complete ROS was performed and is negative except as documented above.     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  Non smoker  No etoh    Medications:  Current Outpatient Medications on File Prior to Visit   Medication Sig Dispense Refill    acetaminophen (TYLENOL) 500 MG tablet Take 2 tablets (1,000 mg) by mouth every 8 hours as needed for Mild Pain (Pain Score 1-3) or Moderate Pain (Pain Score 4-6). 40 tablet 0    albuterol (PROAIR HFA) 108 (90 Base) MCG/ACT inhaler ProAir HFA 90 mcg/actuation aerosol inhaler      allopurinol (ZYLOPRIM) 100 MG  tablet Take 1 tablet (100 mg) by mouth daily. 30 tablet 0    Artificial Tear Solution (SOOTHE XP OP)       busPIRone (BUSPAR) 10 MG tablet TAKE 1 TAB TWICE DAILY 60 tablet 0    busPIRone (BUSPAR) 15 MG tablet Take 1 tablet (15 mg) by mouth 2 times daily. 180 tablet 1    diclofenac (VOLTAREN) 1 % gel Apply 2 g topically every 6 hours as needed (Right knee pain). 100 g 0    DULoxetine (CYMBALTA) 20 MG CR capsule Take 1 capsule (20 mg) by mouth 2 times daily. 180 capsule 2    fluticasone propionate (FLONASE) 50 MCG/ACT nasal spray 2 sprays.      HYDROmorphone (DILAUDID) 2 MG tablet Take 1 tablet (2 mg) by mouth every 4 hours as needed for Moderate Pain (Pain Score 4-6) or Severe Pain (Pain Score 7-10). 60 tablet 0    hydrOXYzine HCL (ATARAX) 25 MG tablet Take 1 tablet (25 mg) by mouth every 8 hours as needed for Anxiety. 30 tablet 2    ibuprofen (MOTRIN) 600 MG tablet       ketotifen (ALAWAY) 0.025 % ophthalmic solution Place 1 drop into both eyes 2 times daily.      loperamide (IMODIUM) 2 MG capsule For mild to moderate diarrhea (less than 5 BMs within 24 hours):  Take 1 capsule (2 mg) by mouth 4 times daily as needed for Diarrhea.  For severe Diarrhea (5 or more medium to large BMs within 24 hours):  take 2 capsules and then 1 capsule every 2 hours until diarrhea stops. 90 capsule 0    loratadine (CLARITIN REDITABS) 10 MG dissolvable tablet       LORazepam (ATIVAN) 0.5 MG tablet Take 1 tablet by mouth 30 to 60 minutes prior to MRI.  May repeat dose once if needed. 2 tablet 0    Multiple Vitamins-Minerals (MULTIVITAMIN WITH MINERALS) TABS tablet Take 1 tablet by mouth daily. 30 tablet 1    naloxone (NARCAN) 4 mg/0.1 mL nasal spray For suspected opioid overdose, call 911! Then spray once in one nostril. Repeat after 3 minutes if no or minimal response using a new spray in other nostril. 2 each 0    Nutritional Supplements (NEPRO) LIQD Take 3 Cans by mouth daily. 30000 mL 11    polyethylene glycol (MIRALAX) 17 g packet  Take 1 packet (17 g) by mouth 2 times daily as needed for Constipation. 1 each 0    pregabalin (LYRICA) 100 MG capsule Take 1 capsule (100 mg) by mouth at bedtime. 30 capsule 0    prochlorperazine (COMPAZINE) 10 MG tablet Take 1 tablet (  10 mg) by mouth every 6 hours as needed (Nausea/Vomiting). 30 tablet 5    senna (SENOKOT) 8.6 MG tablet Take 2 tablets (17.2 mg) by mouth 2 times daily. 30 tablet 0    tizanidine (ZANAFLEX) 2 MG tablet Take 1 tablet (2 mg) by mouth every 8 hours as needed (muscle spasm). 30 tablet 2    traZODone (DESYREL) 50 MG tablet Take one tab nightly by mouth at bedtime as needed for insomnia 90 tablet 0     No current facility-administered medications on file prior to visit.       Physical Examination:   07/20/23  1140   BP: 123/64   Pulse: 59   Temp: 98 F (36.7 C)   Resp: 16   SpO2: 100%     ECOG PS 1  General: NAD  HEENT: Moist mucous membranes.   Neck: No thyromegaly or lymphadenopathy.   Chest: Clear to ausculation bilaterally. No wheezes, rales or rhonchi.    Cardiac: Regular rate and rhythm without any murmurs, rubs or gallops.   Abdomen: Abd soft, non-tender. +BS.   Extremities: No clubbing, cyanosis. No edema  Neurological: Awake, alert and oriented x 3. Strength and sensation are grossly intact.   Psychologic: Appropriate mood and affect.  GU: L PCN site clean and dry, draining clear, yellow urine.     Labs: Following labs reviewed  Lab Results   Component Value Date    WBC 7.9 07/06/2023    RBC 3.80 (L) 07/06/2023    HGB 11.3 (L) 07/06/2023    HCT 34.0 (L) 07/06/2023    MCV 89.5 07/06/2023    MCHC 33.2 07/06/2023    RDW 15.2 (H) 07/06/2023    PLT 272 07/06/2023    MPV 9.4 07/06/2023      Lab Results   Component Value Date    BUN 28 (H) 07/06/2023    CREAT 1.14 07/06/2023    CL 102 07/06/2023    NA 141 07/06/2023    K 4.7 07/06/2023    [place] 10.3 07/06/2023    TBILI 0.43 07/06/2023    ALB 4.3 07/06/2023    TP 7.6 07/06/2023    AST 18 07/06/2023    ALK 109 07/06/2023    BICARB 32 (H)  07/06/2023    ALT 11 07/06/2023    GLU 91 07/06/2023                Imaging:    PET CT 5/08:  1. Left percutaneous nephrostomy in place. There is interval increase in now moderate left hydronephrosis.  2. Multiple small abdominopelvic lymph nodes demonstrate metabolic activity at or slightly above blood pool and remain nonspecific. In particular, a periportal lymph node demonstrates slight interval increase in size and hypermetabolic activity. Continued attention on follow-up is recommended.  3. Interval development of numerous predominantly sclerotic osseous lesions throughout the axial and proximal appendicular skeleton. When compared with prior PET-CT 11/26/2022 many of the previous hypermetabolic lesions demonstrate interval decrease in metabolic activity and increased sclerosis, most compatible with positive response to therapy. Diffuse heterogeneous hypermetabolic activity remains throughout the axial and appendicular skeleton. This may be related to a background of reactive or treatment related process. Ongoing hypermetabolic metastasis is not excluded and continued attention on follow-up is recommended.  4. Interval development of multiple mildly hypermetabolic mediastinal and hilar lymph nodes. Findings are nonspecific and could represent a superimposed infectious or inflammatory process. However, new site of metastatic disease is not excluded and close attention on follow-up is  recommended.  5. Persistent focal hypermetabolic activity associated with soft tissue prominence along the left lateral aspect of the distal sigmoid colon. Findings are in the setting of large colonic stool burden and could represent stercoral colitis. Correlate with symptoms.      Assessment:  Markis Langland is a 72 year old male with OA, HTN, MDD with L UTUC with metastatic disease ([id number]), with osseous mets (Pet/CT with diffuse enhancement and discrete lesions). FGFR-TACC mutation.  Started on EV+Pembro.    Scans after 4  cycles with improvement of osseous lesions, but demonstrate substantial sclerotic osseous lesions, suggesting treated lesions not scene prior.    I personally reviewed labs as above.  I personally reviewed scans as above.    [name] is here for cycle 8 of enfortumab vedotin and pembrolizumab.  His PET scan, though not read officially by Radiology, on my read, has significant uptake in his mediastinal lymph nodes.  This is concerning for  progression of disease in his lymph nodes.  I discussed this with [name].  We will wait for the official read, but I told him I was concerned that his current treatment and no longer be keeping his cancer under control.  We discussed next steps and I discussed options such as chemotherapy and FGFR inhibitors.  We also discussed a clinical trial of a novel FGFR inhibitor which he is interested in.  We will continue his treatment with enfortumab vedotin and pembrolizumab today and await the official read on the scan.  If there is indeed progression of his disease we will screen him for the clinical trial.  I had my clinical research coordinator meet with him and give him the consent to look over.    He will also see rad onc to discuss treatment for his lower spine and also to complete XRT to his R shoulder.       Advanced Urothelial Carcinoma, [id number]  - as above  - proceed with C8  - Treatment plan:  --- Enfortumab vedotin 1.25 --> 1.0 mg/kg --> 0.75 mg/kg  (dose reduced prior to C5 to 0.75) D1, D8 Q21d  --- Pembrolizumab 200 mg D1 Q21d  --- prn anti-nausea meds  --- prn antidiarrheal medications  --- prn topical skin care  --- ED precautions  - consider adding zometa given significnt bone disease; needs dental clearance - he's known to have significant dental issues so this needs to be cleared first  - should follow up with radiation oncologist to consider XRT to lower spine.   - RTC 3 weeks to discuss treatment options  - if POD on Pet/Ct, will screen for FGFR study; if not eligible  for study, consider alternative treatment options.     Cancer related pain  - follows with palliative care, on multiple medications for pain including dilaudid  - caution given multiple events of AMS    R shoulder pain  - 2/2 metastatic lesion  - seen by ortho 1/22  - needs follow up with rad onc to get XRT restarted    Anxiety  - f/u with palliative care  - monitor closely    RLE edema  - improved    L PCN, initially placed 04/2022  - draining well, will monitor    This visit was spent addressing a complex, chronic illness that poses a threat to life or bodily function to this patient.  I independently reviewed the patient's history since prior visit, reviewed intervening labs and imaging, counseled the patient on the management  of their chronic illness, and placed orders to be done prior to next visit.     Asa Saunas. Roseanne Reno, M.D.  Assistant Professor of Medicine   [employer] [place], [facility]  [facility]  [address]  [place], [place] [address]

## 2023-07-20 NOTE — Interdisciplinary (Signed)
Left PCN dressing  - soiled last changed 4 days ago    changed completed   Provided education regarding daily changes or to forgo dressing given tract is well established- 04/2022

## 2023-07-20 NOTE — Interdisciplinary (Signed)
Patient arrived w/ walker ambulatory in stable condition. Pt. denies nausea, vomiting, diarrhea, and constipation.     Right chest port prepped and accessed per sterile protocol with positive blood return. 20G 3/4 inch Huber needle inserted.   Labs drawn: 1105  Biopatch placed: no  Port flushed with 20ml NS and 500 units Heparin and Curos capped. Dressing applied.   Patient tolerated well and discharged in stable condition to lobby awaiting labs.   Infusion RN to review lab results.    Medications   lidocaine (XYLOCAINE) 1% PF injection 0.3 mL (has no administration in time range)   sodium chloride 0.9 % flush 20 mL (has no administration in time range)   heparin (HEP-LOCK) flush injection 500 Units (has no administration in time range)     Paul Dykes, LVN  07/20/23

## 2023-07-20 NOTE — Patient Instructions (Addendum)
Patient Instructions:    -- You will receive treatment today    -- You are scheduled for a follow up with Dr. Roseanne Reno on 08/10/23 at 2:30 pm.       Have questions about Advanced Care Planning/Advance Directive?  PREPARE ([email or web address])         Future Appointments   Date Time Provider Department Center   07/20/2023  1:00 PM [facility] INFUSION/CTI [facility] Onc Chem [facility]   07/26/2023  2:00 PM Jordan Hawks, MD [facility] RAD ONC [facility]   08/10/2023  2:30 PM Cheri Guppy, MD [facility]   08/14/2023  2:00 PM Shea Evans, MD [facility] Onc [facility]   08/19/2023  8:00 AM [facility] MRI 2 [facility] MRI Jillene Bucks         Visit this website for Cancer resources at our [facility].  This includes information on [facility] Chemo Support Groups, Mind/Body Wellness, Treatment Education Classes, and other [facility] Services:      [email or web address]       *Chief Operating Officer Website:  [email or web address]    Contact Information:   Brock Ra, MD   Assistant Professor of Medicine  Medical Oncology  Genitourinary Malignancy      Franco Nones, M.S., PA-C  Sr. Physician Assistant    Clinical questions or concerns  Oncology Nurse Case Manager   Elsworth Soho RN, BSN  T: [phone]  Department of Urology   [facility]  [address]  [place], [place] [address]    Dr. Roseanne Reno Follow Up Appointment Assistance  Administrative Assistant   Terence Lux  T: [phone]  F: [phone]    Monday-Friday 8:00- 4:30p.m. (Closed Holidays and Weekends)  Please listen to message carefully for daily information.   Leave a detailed message with your name, medical record number and   telephone number. Messages are usually returned within 24 hours.     [facility] Phone List     AFTER HOURS EMERGENCY NUMBER   Ask for the Oncologist Physician On-Call.                   [phone]    Outpatient  [facility] Scheduling line: [phone]  Call for information about our new location and to schedule or cancel appointments.    [facility] Scheduling line: [phone]   Call to schedule your [name], CT Scans or MRI studies.    PET Scan Scheduling line: [phone]  Call to schedule your PET scan    Prostate MRI Scheduling line: [phone] or [phone]  Call to schedule your prostate MRI.   Performed at the [facility] ([facility]).    Nuclear Medicine Scheduling Line [phone]   Call to schedule your bone scan, Radium 223     [facility] Infusion Center: [phone]   Call to schedule, cancel, or reschedule chemotherapy appointments.  Infusion Center Hours--- Monday-Friday 7:00-7:30 p.m.; Sat-Sun 8:00-4:30pm.    [facility] ([address]): [phone]  Monday-Friday 730-530 p.m.; Closed Weekends    Radiation Oncology: [phone]    Call to schedule, cancel, or reschedule radiation appointments    Social Worker  [place]: Mathews [place], [place]    T: [phone]  [facility]: Henri Medal, LCSW  T: [phone]    Financial Counselors   [phone] at [facility]   [phone] or [phone]      [facility]  [email or web address] [address], [place] [address]    -You can also reach [name] by MyChart. To set up your MyChart please see the following site: [email or web address]  -For cancer support services, please visit out Patient and [facility].  -For [facility] support services, please visit:  -[email or web address]  -For other support from the American Cancer Society, please visit: [email or web address]/[email or web address]              Thank you for allowing [name] to participate in your care today! We value your feedback as we strive to provide every patient with an exceptional care experience.     You may receive a patient satisfaction  survey in the mail in the next few weeks. Please fill out the survey upon receipt and return it in the self-addressed envelope. Your feedback will be used to help improve the experience for all our patients at [facility].     If you want to share a positive experience you had at our facility please call We Listen at [phone]. If you have any suggestions on how we can improve our services please call the [facility] leadership team at [phone].    For medical questions or emergencies, please call your physician's office or 911. Thank you!

## 2023-07-22 ENCOUNTER — Encounter (HOSPITAL_BASED_OUTPATIENT_CLINIC_OR_DEPARTMENT_OTHER): Payer: Self-pay

## 2023-07-22 NOTE — Telephone Encounter (Signed)
S/P lumbar fusion Yes     Cervical spondylosis           PLAN/DISCUSSION: Reviewed clinical history as well as exam results and imaging findings with patient in detail.      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 We will obtain an MRI thoracic spine to further evaluate these lesions. At this time we would also  recommend continued PT and injections as well as continued observation, and have provided a referral to pain management for evaluation for further injections.     FOLLOWUP: Return to clinic: three months. The patient is encouraged to call us with any questions or problems in the interim. Our contact numbers were given to the patient.            Electronically signed by Hyman Hopes, MD at 07/18/2023  7:13 AM         Office Visit on 07/03/2023

## 2023-07-22 NOTE — Telephone Encounter (Signed)
From: [name]  To: [name], MD  Sent: 07/18/2023 1:61 PM PDT  Subject: MRI results     please review my MR and my PET scan results any new treatment ideas? [id number] thank you

## 2023-07-23 ENCOUNTER — Encounter (HOSPITAL_BASED_OUTPATIENT_CLINIC_OR_DEPARTMENT_OTHER): Payer: Self-pay | Admitting: Hematology & Oncology

## 2023-07-23 NOTE — Telephone Encounter (Signed)
I dont see where the MRI results or the images are.

## 2023-07-24 ENCOUNTER — Ambulatory Visit (HOSPITAL_BASED_OUTPATIENT_CLINIC_OR_DEPARTMENT_OTHER): Payer: Medicare Other

## 2023-07-24 NOTE — Progress Notes (Signed)
Adult Psychiatry Established Patient Follow-up Appt    Patient name: Stephen [name]  Date: Monday July 10, 2023    Clinic: [facility]    HISTORY     HPI: Chief Complaint: Anxiety  HPI: Stephen [name] is a 72 year old male with OA, HTN, MDD with L UTUC with metastatic disease ([id number]), with osseous mets (Pet/CT with diffuse enhancement and discrete lesions) who presents to clinic for a psychiatric follow up appointment.     Interim History:   The patient is seen for interval follow-up care.  On evaluation, the patient reports court not doing that great and court.  The patient reports that his pain continued to increase and he is scheduled for MRI/PET scan imaging studies for information that may inform his treatment planning.  At this time, he reports "the lesions or floating in my spine, they are everywhere"  At this time, he reports that he is court at the last stage of pain medicine "death pain medicine".  He reports that "I am functioning but not that great...  Buying some more time".  At this time, the patient does not endorse any core neurovegetative symptoms of depression, including suicidality.  He reports that he is more anxious on account of pain.  He reports no adverse side effects of the trial of the BuSpar.  He reports that he is "hang in there", denying any somatic anxiety or panic attacks.  At this time, he does not endorse any adverse side effects of the trial of the BuSpar.  He is readily reassured about his upcoming imaging studies and follow-up with palliative Medicine for optimal pain control.    Past Psychiatric, Family, & Social History Updates:   Substance use:  No tobacco alcohol illicit drug use  Current work/support:   Limited functioning capacity on account of persistent pain.  The patient reports compliance with BuSpar as prescribed.      Past Medical History:  Patient Active Problem List   Diagnosis    Post-traumatic stricture of anterior urethra    Bladder  neck contracture    Lumbar radiculopathy    Cervical radiculopathy    Incomplete bladder emptying    Lumbosacral spondylosis without myelopathy    Chronic SI joint pain    Degenerative spondylolisthesis    Lumbar stenosis with neurogenic claudication    Pre-procedure lab exam    Cervical spondylosis without myelopathy    Cervical spondylosis    S/P lumbar fusion    Chronic left SI joint pain    Chronic midline thoracic back pain    Spondylolisthesis at L5-S1 level    Left renal mass    Malignant neoplasm of urinary bladder, unspecified site (CMS-HCC)    Dorsopathy    Ureter malignant neoplasm, left (CMS-HCC)    Cauda equina compression (CMS-HCC)    COVID-19 virus infection    Community acquired pneumonia    Acute urinary retention    HTN (hypertension)    Gout    MDD (major depressive disorder)    Urothelial cancer (CMS-HCC)    Urinary tract infection associated with indwelling urethral catheter (CMS-HCC)    Ureteral obstruction    Hydronephrosis    Urothelial carcinoma of kidney, left (CMS-HCC)    Myalgia, other site    Trigger point with back pain    Pre-op evaluation    Malignant neoplasm of overlapping sites of bladder (CMS-HCC)    Cancer associated pain    Rhabdomyolysis    Acute kidney  injury superimposed on chronic kidney disease (CMS-HCC)    Hypotension    Altered mental status       Allergies:   Allergies   Allergen Reactions    Sulfa Drugs Unspecified, Hives and Other       Medications:  Current Outpatient Medications on File Prior to Visit   Medication Sig Dispense Refill    acetaminophen (TYLENOL) 500 MG tablet Take 2 tablets (1,000 mg) by mouth every 8 hours as needed for Mild Pain (Pain Score 1-3) or Moderate Pain (Pain Score 4-6). 40 tablet 0    albuterol (PROAIR HFA) 108 (90 Base) MCG/ACT inhaler ProAir HFA 90 mcg/actuation aerosol inhaler      allopurinol (ZYLOPRIM) 100 MG tablet Take 1 tablet (100 mg) by mouth daily. 30 tablet 0    Artificial Tear Solution (SOOTHE XP OP)       busPIRone (BUSPAR) 10  MG tablet TAKE 1 TAB TWICE DAILY 60 tablet 0    diclofenac (VOLTAREN) 1 % gel Apply 2 g topically every 6 hours as needed (Right knee pain). 100 g 0    DULoxetine (CYMBALTA) 20 MG CR capsule Take 1 capsule (20 mg) by mouth 2 times daily. 180 capsule 2    fluticasone propionate (FLONASE) 50 MCG/ACT nasal spray 2 sprays.      hydrOXYzine HCL (ATARAX) 25 MG tablet Take 1 tablet (25 mg) by mouth every 8 hours as needed for Anxiety. 30 tablet 2    ibuprofen (MOTRIN) 600 MG tablet       ketotifen (ALAWAY) 0.025 % ophthalmic solution Place 1 drop into both eyes 2 times daily.      loperamide (IMODIUM) 2 MG capsule For mild to moderate diarrhea (less than 5 BMs within 24 hours):  Take 1 capsule (2 mg) by mouth 4 times daily as needed for Diarrhea.  For severe Diarrhea (5 or more medium to large BMs within 24 hours):  take 2 capsules and then 1 capsule every 2 hours until diarrhea stops. 90 capsule 0    loratadine (CLARITIN REDITABS) 10 MG dissolvable tablet       Multiple Vitamins-Minerals (MULTIVITAMIN WITH MINERALS) TABS tablet Take 1 tablet by mouth daily. 30 tablet 1    naloxone (NARCAN) 4 mg/0.1 mL nasal spray For suspected opioid overdose, call 911! Then spray once in one nostril. Repeat after 3 minutes if no or minimal response using a new spray in other nostril. 2 each 0    Nutritional Supplements (NEPRO) LIQD Take 3 Cans by mouth daily. 30000 mL 11    polyethylene glycol (MIRALAX) 17 g packet Take 1 packet (17 g) by mouth 2 times daily as needed for Constipation. 1 each 0    pregabalin (LYRICA) 100 MG capsule Take 1 capsule (100 mg) by mouth at bedtime. 30 capsule 0    prochlorperazine (COMPAZINE) 10 MG tablet Take 1 tablet (10 mg) by mouth every 6 hours as needed (Nausea/Vomiting). 30 tablet 5    senna (SENOKOT) 8.6 MG tablet Take 2 tablets (17.2 mg) by mouth 2 times daily. 30 tablet 0    tizanidine (ZANAFLEX) 2 MG tablet Take 1 tablet (2 mg) by mouth every 8 hours as needed (muscle spasm). 30 tablet 2     traZODone (DESYREL) 50 MG tablet Take one tab nightly by mouth at bedtime as needed for insomnia 90 tablet 0     No current facility-administered medications on file prior to visit.       PSYCHIATRIC SPECIALTY EXAMINATION  Vital Signs:   There were no vitals taken for this visit.    Psychometric Scales:      04/06/2023     5:00 PM 04/27/2023     8:14 PM 05/11/2023    11:00 AM 06/01/2023    11:30 AM 06/05/2023     8:37 AM 06/29/2023     9:53 AM 07/20/2023     1:37 PM   Stephen [name] PHQ9 DEPRESSION QUESTIONNAIRE   Interest 0 0 0 2 0 0 0   Depressed 0 0 0 1 0 1 0          06/05/2023     8:36 AM   GAD 7   1. Feeling nervous, anxious or on edge 1   2. Not being able to stop or control worrying 1   3. Worrying too much about different things 1   4. Trouble relaxing 1   5. Being so restless that it is hard to sit still 0   6. Being easily annoyed or irritable 0   7. Feeling afraid as if something awful might happen 0   GAD7 Patient Total 4   If you checked off any problems, how difficult have these problems made it for you to do your job along with other people? Not difficult at all           No data to display                Pertinent Studies/Labs:    Latest Reference Range & Units 07/06/23 08:26   Sodium 136 - 145 mmol/L 141   Potassium 3.5 - 5.1 mmol/L 4.7   Chloride 98 - 107 mmol/L 102   Bicarbonate 22 - 29 mmol/L 32 (H)   Anion Gap 7 - 15 mmol/L 7   BUN 8 - 23 mg/dL 28 (H)   Creatinine 1.61 - 1.17 mg/dL 0.96   eGFR Based on CKD-EPI 2021 Equation mL/min/1.73 m2 >60   Glucose 70 - 99 mg/dL 91   Calcium 8.5 - 04.5 mg/dL 40.9   Alkaline Phos 40 - 129 U/L 109   ALT (SGPT) 0 - 41 U/L 11   AST (SGOT) 0 - 40 U/L 18   Bilirubin, Tot <1.2 mg/dL 8.11   Albumin 3.5 - 5.2 g/dL 4.3   Total Protein 6.0 - 8.0 g/dL 7.6   (H): Data is abnormally high    Mental Status Examination:   Appearance:  Appropriately groomed for the season, ambulating with a walker, cooperative, frustrated with no evidence of psychomotor agitation.    Behavior:  Help seeking,  in pain, able to establish a rapport  Motor/Abnormal Involuntary Movements:  None noted  Gait: not assessed  Speech: understandable/clear, coherent/meaningful, normal rate, and normal volume  Mood: "Still in pain"   Affect:  Mood congruent, broad range of affect   Thought Process: Coherent, logical  Associations: linear and goal-directed  Thought Content:  At this time, the patient does not endorse any core neurovegetative symptoms of depression, including suicidality.  It was not endorse any disrupted sleep or psychomotor agitation.  She is preoccupied with pain, triggering anxiety and grief over his prognosis in the long term.  He is concern over functional impairment on account of pain burden.  At this time, is hopeful for optimal pain control.  He is compliant with his Cymbalta as prescribed by palliative Medicine.  He has not experienced any adverse side effects to the trial of the BuSpar.  He is in agreement with titration  of the BuSpar as tolerated to target his residual anxiety.  Perceptions: no abnl  Insight/Judgment:  Good/good   Orientation: AAO x4   Memory: recent and remote memory grossly intact alert and oriented to person time place and situation  Attention/Concentration: no gross abnormalities, adequate  Language: Average based on verbal fluency and interaction   Fund of knowledge and Intellect: Average based on interview and verbal fluency    MEDICAL DECISION MAKING     Assessment:  Daymond Ellsworth is a 72 year old male with OA, HTN, MDD with L UTUC with metastatic disease ([id number]), with osseous mets (Pet/CT with diffuse enhancement and discrete lesions) who presents to clinic for a psychiatric intake appointment, reporting he can not pain burden and related anxiety in the face of a guarded prognosis and progressive disease.      Patient Strengths: Seeking treatment voluntarily, Supportive/involved family/friends, Well connected to community/professional resources, History of independent  functioning, Accepts need for medication/therapeutic interventions, Intact cognition, Has insight into illness, Stable vocational history, History of treatment adherence, Stable living arrangements, and Average intelligence      A comprehensive suicide risk assessment was performed and the patient was assessed to be at a low acute risk of self-harm.  Modifiable risk factors include precipitating stressors and severe medical illness.  Non-modifiable risk factors include existing psychiatric diagnoses, male gender, and older age.  The patient also has protective factors of future life plans, coping skills, and access to health care.        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 Adjustment disorder, with anxious mood  3. Urothelial cancer        Plan  At this time, the patient was educated about his premorbid history, his presenting predicament, and his course in his prescribed medication regime. At this time, he is instructed to continue Cymbalta 60 mg p.o. b.i.d..  He is instructed to discontinue BuSpar 10 mg p.o. b.i.d. he is directed to start BuSpar 15 mg p.o. b.i.d.Marland Kitchen  At this time, the patient has responded well to psychoeducation about indication for adjunctive psychotherapy.  In the interim, he will continue routine follow-up with the palliative care team for optimal pain control.  He is encouraged to add structure to his daily routine.  He patient directed to work with SW to tap into local resources to meet his needs, including other psychosocial support where indicated.  The patient was informed of my departure from [facility] effective 07/19/2023.  He anticipate his care to transition to a colleague taking over my case load as scheduled.  The patient will return to clinic for follow-up as scheduled.     Labs/Tests/Consultation:  None  Referrals:  None       Additional Planning:  - Provided psychoeducation, supportive therapy and empathic listening  today.  - Discussed suicide and emergency room precautions.  - Discussed risks/benefits/alternatives to medication, patient voiced understanding.  - CURES review on 07/10/23 reveals no suspicious activity.      Disposition: Return visit six weeks    Tommy Rainwater, MD  Staff Psychiatrist  [facility]

## 2023-07-25 ENCOUNTER — Other Ambulatory Visit (HOSPITAL_BASED_OUTPATIENT_CLINIC_OR_DEPARTMENT_OTHER): Payer: Self-pay | Admitting: Nurse Practitioner

## 2023-07-25 DIAGNOSIS — G893 Neoplasm related pain (acute) (chronic): Secondary | ICD-10-CM

## 2023-07-25 DIAGNOSIS — F064 Anxiety disorder due to known physiological condition: Secondary | ICD-10-CM

## 2023-07-25 DIAGNOSIS — C7910 Secondary malignant neoplasm of unspecified urinary organs: Secondary | ICD-10-CM

## 2023-07-26 ENCOUNTER — Ambulatory Visit (HOSPITAL_BASED_OUTPATIENT_CLINIC_OR_DEPARTMENT_OTHER)
Admit: 2023-07-26 | Discharge: 2023-07-26 | Payer: Medicare Other | Attending: Hematology & Oncology | Admitting: Hematology & Oncology

## 2023-07-26 ENCOUNTER — Ambulatory Visit (HOSPITAL_BASED_OUTPATIENT_CLINIC_OR_DEPARTMENT_OTHER): Payer: Medicare Other

## 2023-07-26 DIAGNOSIS — C7951 Secondary malignant neoplasm of bone: Secondary | ICD-10-CM | POA: Insufficient documentation

## 2023-07-26 DIAGNOSIS — C679 Malignant neoplasm of bladder, unspecified: Secondary | ICD-10-CM

## 2023-07-26 DIAGNOSIS — C7910 Secondary malignant neoplasm of unspecified urinary organs: Secondary | ICD-10-CM | POA: Insufficient documentation

## 2023-07-26 DIAGNOSIS — G893 Neoplasm related pain (acute) (chronic): Secondary | ICD-10-CM

## 2023-07-26 DIAGNOSIS — C662 Malignant neoplasm of left ureter: Secondary | ICD-10-CM | POA: Insufficient documentation

## 2023-07-26 MED ORDER — PREGABALIN 100 MG OR CAPS
100.0000 mg | ORAL_CAPSULE | Freq: Every evening | ORAL | 0 refills | Status: DC
Start: 2023-07-26 — End: 2023-08-26

## 2023-07-26 NOTE — Discharge Instructions (Signed)
NAME - Stephen [name]   MRN - [id number]   DOB - 08/16/1951   Date - 07/26/2023     [facility]  RADIATION ONCOLOGY DEPARTMENT  Monday - Friday 8:00 AM to 5:00 PM   Telephone Number: [phone]    Your Radiation Oncologist is Dr. Gillermo Murdoch    For clinical questions or concerns contact:  Sherryl Barters RN  Telephone Number: [phone]   Monday-Thursday    For scheduling contact:   Jen Mow Electronics engineer)  Telephone Number: [phone]    Preparing for Radiation Therapy Class  When:  Each Wednesday from 1:00-2:00 PM - you are welcome to attend as many times as you wish  Where:  via Zoom  Registration: Please register online to receive the Zoom details to join the class at [email or web address]  and go to Patient Resources - Patient Family Events - Preparing for Radiation Therapy Class.    Radiation Treatment Planning:  The next appointment in our department will be for a CT simulation (mapping scan), which is done with a CT scanner.    Our Environmental health practitioner will call you with your CT Simulation date, once she receives authorization to treat from your insurance company. The approval from your insurance can take up to two weeks to obtain.  The imaging from this scan is used to plan and develop the individual, specialized treatment for your tumor.  The imaging is also used to determine the exact location and measurement of the treatment area, and to create a shield to protect normal tissue.  Small marks (tattoos or dots of colored ink) may be placed on your skin during this appointment. These marks show where to aim the radiation.  You will receive your radiation start date at your CT simulation appointment.  Your treatment usually begins 10-14 business days after your CT simulation for the prescribed number of treatments.  We know it can be difficult to wait for your treatment start date, but it is very important that your physician and our  staff have time to ensure your treatment is planned accurately and safely.     Oncology Authorization Phone #  Phone # [phone] for Pre-Service Authorization Questions   Hours of operation Monday - Friday 8a-4p    Post Service Insurance & Billing Questions:  Thank you for choosing [facility] for your medical care. We're here to help. For billing questions, you can:  Call [phone], 9 a.m. to 6 p.m. Monday to Friday  Email [name] at [email or web address] (include the patient's guarantor/account number, medical       record number or telephone number to help [name] locate your account)  Visit Frequently Asked Questions for information about facility fees, insurance, financial assistance, your billing statements and general billing issues  [email or web address]    Radiation Appointments:  We treat Monday through Friday.    All of your treatment appointments will be scheduled by our Radiation Therapists. Please discuss all treatment scheduling concerns directly with your Radiation Therapist when you come for your first appointment.    On Treatment Visits:   Dr. Lodema Hong will check on you each week during treatment.    This is usually done on Wednesday, immediately following your scheduled treatment time.  Please address any questions or concerns at this visit.     About the Treatment:  What is external beam radiation therapy?  It is a common cancer treatment that uses high doses of radiation to destroy cancer cells and shrink tumors.  A large machine aims radiation at the cancer.  The machine moves around you without touching you.   Nothing will touch or burn you, and you will not feel anything during the treatment.   It does not make you radioactive.     How does treatment work?   At high doses, radiation is used as an x-ray to take pictures inside your body. In cancer treatment, higher doses of radiation are used to destroy cancer cells.     Side Effects and Management    SKIN  CARE AND SKIN CHANGES  Check with your doctor or nurse before you put anything on your skin.  Look at the list of skin products at the bottom of this sheet. Ask your doctor or nurse which ones are okay for you to use.    Protect your skin:  Make sure your clothing covers the area being treated when you are outside. (This may mean wearing a hat).   Wear clothes that are loose.  Choose clothes and bed sheets made of soft cotton.  Use an Neurosurgeon if your doctor or nurse says you can shave.    Care for your skin:  Shower or bathe with warm, not hot, water. Don't shower more than one time a day.  If you bathe, limit baths to two times a week. Bathe for less than 30 minutes.  Gently pat your skin dry after showers or baths.  Don't rub off the markings your radiation therapist made on your skin. They show where to place the radiation.  Don't use heating pads, ice packs, or bandages on the area getting radiation.  Don't use tanning beds.   Ask your doctor or nurse about the best time to put on lotion or other skin products.  Watch out for a more serious skin problem (moist/wet/peeling skin reaction).   If your skin hurts in the area where you get treatment, tell your doctor or nurse.  Your skin might have a moist reaction. Most often this happens in areas where the skin folds, such as behind the ears or under the breasts. It can lead to an infection if not properly treated.   Ask your doctor or nurse how to care for these areas.        What To Do When You Feel Weak Or Tired    FATIGUE    Did you know that most people getting radiation therapy feel very tired?  Fatigue does not mean that the cancer is getting worse or that the treatment is not working.  Feeling tired is normal during this time. You may feel a little tired or very tired during radiation therapy.    TIPS:  Be active if you can.  Most people feel better when they exercise each day. Some people even sleep and eat better when they exercise.  Walk for 15 to  30 minutes each day.  Take a short bike ride or ride an exercise bike.  Choose an exercise or sport that you enjoy.     Do fewer things.  Ask for help when you need it. You may have times of high and low energy.  Do the activities that are most important to you first.  Ask family and friends for help. They can make meals, drive you to the doctor, or help in other ways.  Learn your limits. Don't fill your day with too many activities. Plan a work schedule that is right for you.  Some people feel well enough  to work. Others need to cut back.  Take medical leave if you need to.  Ask your boss if you can work from home.    Plan time to rest.  Many people need more rest during radiation therapy.  Sleep at least 8 hours each night.  Take short naps during the day. Nap for less than 1 hour at a time.  Read a book or listen to music to relax before going to bed at night.    The detailed side effects of radiation were discussed with you by your physician during the consult. A copy of your consent form detailing these effects was given to you.      Social Work:  A Child psychotherapist is available in Marketing executive for any concerns you may have.   Your nurse or doctor can assist you with an appointment.  The Social Worker can also be reached at [phone].    After Hours Information:  For urgent matters after hours, on weekends, or on holidays please call  [phone] and ask to speak with the Radiation Oncologist ON CALL. If you feel like you are having a medical emergency please go to your nearest emergency room or call 911.

## 2023-07-26 NOTE — Telephone Encounter (Signed)
Refills sent to provider.

## 2023-07-26 NOTE — Interdisciplinary (Signed)
Nutrition Outpatient Follow up     Location of Service: [facility]  [facility]  [address] 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  Patient has multiple co-morbidities that pose substantial risk if exposed to COVID-19.         MD Assessment:  Stephen [name] is a 72 year old male with OA, HTN, MDD with L UTUC with metastatic disease ([id number]), with osseous mets (Pet/CT with diffuse enhancement and discrete lesions). FGFR-TACC mutation.  Started on EV+Pembro.     Scans after 4 cycles with improvement of osseous lesions, but demonstrate substantial sclerotic osseous lesions, suggesting treated lesions not scene prior.  ______________________________________________________________________  ______________________________________________________________________      Chief Complaint [Nutritional]: Nutritional Optimization    RD Assessment: Pt presents unaccompanied to telephone call. Reports stable PO intake, drinks 3 nepro shakes daily in addition to meals and snacks, states he tries to eat as much as he can/has available to him. Continues to receive Nepro via [facility]. Reports weight gain, see below for details. States that his treatment regimen might be changing and/or he might be put on an experimental program.     Encouraged pt to continue w/ stable PO intake promoting weight maintenance. Continue small frequent meals every 2-3 hours throughout the day and choosing high calorie high protein food and liquids choices to aid in meeting nutrient needs and maximizing intake. Continue 3 nepro/day to aid in meeting  nutrient needs. Reviewed importance of consistent adequate nutrition and fluids even with change in treatment plan. Pt v/u, will continue to follow closely.       GI: Denies N/V/D/C, last BM was this morning. Reports he continues to lean more towards constipation/slow bowel movements.        Diet Type: oral    Estimated Nutrition Needs:       Calories: 2250-2625 kcal (30-35 kcal/kg)     Protein: 90-112 gm (1.2-1.5 gm/kg)     Fluids: 1 mL/kcal OR per MD            Food allergies/intolerances: NKFA     Supplements: MVI    Education / Intervention:  Focus on scheduled eating pattern of small freq meals every 2-3 hours throughout the day  Choose high calorie high protein foods to maximize intake  Recommend oral nutrition supplement to support meeting nutrient needs: Nepro 3x/day as warranted   Continue working on hydration status  Continue follow up w/ SLP and diet texture per their recs: Easy to chew - 7, Drinks = Thins - 0     Goals: - Pt to demonstrate intake of >80% estimated needs with good tolerance as evidenced by pt report.        ____________________________________________________________________________________________________________________________________________    Vitals:  Ht:   Height - Most Recent Measurement   07/20/23 5' 6.14" (1.68 m)      Wt.:  8/7: reports recent weight of 160# - pt presents with ~5# weight gain over the past 2-3 months   05/04/23: Pt presents with additional wt loss, reports weight  recently fluctuating  between: 154#, - 156#  02/23/23: pt reports ~20# weight loss in the last month (significant 12.5% weight loss in 1 month)  Wt Readings from Last 1 Encounters:   07/20/23 74.2 kg (163 lb 9.3 oz)           BMI: Estimated body mass index is 26.29 kg/m as calculated from the following:    Height as of 07/20/23: 5' 6.14" (1.68 m).    Weight as of 07/20/23: 74.2 kg (163 lb 9.3 oz).  Wt hx: UBW: 184#  Wt Readings from Last 10 Encounters:   07/20/23 74.2 kg (163 lb 9.3 oz)   07/20/23 74.2 kg  (163 lb 9.3 oz)   07/20/23 74.2 kg (163 lb 9.3 oz)   07/06/23 71.9 kg (158 lb 8.2 oz)   07/06/23 71.9 kg (158 lb 8.2 oz)   07/04/23 72.6 kg (160 lb)   07/03/23 73.5 kg (162 lb)   06/30/23 73.5 kg (162 lb)   06/29/23 71.6 kg (157 lb 13.6 oz)   06/29/23 71.7 kg (158 lb 2.9 oz)         Labs:  Lab Results   Component Value Date    WBC 6.4 07/20/2023    RBC 3.39 (L) 07/20/2023    HGB 10.2 (L) 07/20/2023    HCT 30.6 (L) 07/20/2023    MCV 90.3 07/20/2023    MCHC 33.3 07/20/2023    RDW 15.6 (H) 07/20/2023    PLT 237 07/20/2023    MPV 9.7 07/20/2023    ABSNEUTRO 3.8 07/20/2023    IANC 3.8 07/20/2023     Lab Results   Component Value Date    NA 139 07/20/2023    K 4.3 07/20/2023    CL 99 07/20/2023    BICARB 28 07/20/2023    BUN 27 (H) 07/20/2023    CREAT 1.28 (H) 07/20/2023    GLU 114 (H) 07/20/2023    [place] 9.9 07/20/2023     Lab Results   Component Value Date    AST 21 07/20/2023    ALT 12 07/20/2023    ALK 113 07/20/2023    TP 6.7 07/20/2023    ALB 3.9 07/20/2023    TBILI 0.50 07/20/2023     Lab Results   Component Value Date    PHOS 4.1 04/27/2023    MG 1.8 04/27/2023         Medications/supplements:  Current Outpatient Medications   Medication Sig    acetaminophen (TYLENOL) 500 MG tablet Take 2 tablets (1,000 mg) by mouth every 8 hours as needed for Mild Pain (Pain Score 1-3) or Moderate Pain (Pain Score 4-6).    albuterol (PROAIR HFA) 108 (90 Base) MCG/ACT inhaler ProAir HFA 90 mcg/actuation aerosol inhaler    allopurinol (ZYLOPRIM) 100 MG tablet Take 1 tablet (100 mg) by mouth daily.    Artificial Tear Solution (SOOTHE XP OP)     busPIRone (BUSPAR) 10 MG tablet TAKE 1 TAB TWICE DAILY    busPIRone (BUSPAR) 15 MG tablet Take 1 tablet (15 mg) by mouth 2 times daily.    diclofenac (VOLTAREN) 1 % gel Apply 2 g topically every 6 hours as needed (Right knee pain).    DULoxetine (CYMBALTA) 20 MG CR capsule Take 1 capsule (20 mg) by mouth 2 times daily.    fluticasone propionate (FLONASE) 50 MCG/ACT nasal spray 2 sprays.     HYDROmorphone (DILAUDID) 2 MG tablet Take 1 tablet (2 mg) by mouth every 4 hours as needed for Moderate Pain (Pain  Score 4-6) or Severe Pain (Pain Score 7-10).    hydrOXYzine HCL (ATARAX) 25 MG tablet Take 1 tablet (25 mg) by mouth every 8 hours as needed for Anxiety.    ibuprofen (MOTRIN) 600 MG tablet     ketotifen (ALAWAY) 0.025 % ophthalmic solution Place 1 drop into both eyes 2 times daily.    loperamide (IMODIUM) 2 MG capsule For mild to moderate diarrhea (less than 5 BMs within 24 hours):  Take 1 capsule (2 mg) by mouth 4 times daily as needed for Diarrhea.  For severe Diarrhea (5 or more medium to large BMs within 24 hours):  take 2 capsules and then 1 capsule every 2 hours until diarrhea stops.    loratadine (CLARITIN REDITABS) 10 MG dissolvable tablet     LORazepam (ATIVAN) 0.5 MG tablet Take 1 tablet by mouth 30 to 60 minutes prior to MRI.  May repeat dose once if needed.    Multiple Vitamins-Minerals (MULTIVITAMIN WITH MINERALS) TABS tablet Take 1 tablet by mouth daily.    naloxone (NARCAN) 4 mg/0.1 mL nasal spray For suspected opioid overdose, call 911! Then spray once in one nostril. Repeat after 3 minutes if no or minimal response using a new spray in other nostril.    Nutritional Supplements (NEPRO) LIQD Take 3 Cans by mouth daily.    polyethylene glycol (MIRALAX) 17 g packet Take 1 packet (17 g) by mouth 2 times daily as needed for Constipation.    pregabalin (LYRICA) 100 MG capsule Take 1 capsule (100 mg) by mouth at bedtime.    prochlorperazine (COMPAZINE) 10 MG tablet Take 1 tablet (10 mg) by mouth every 6 hours as needed (Nausea/Vomiting).    senna (SENOKOT) 8.6 MG tablet Take 2 tablets (17.2 mg) by mouth 2 times daily.    tizanidine (ZANAFLEX) 2 MG tablet Take 1 tablet (2 mg) by mouth every 8 hours as needed (muscle spasm).    traZODone (DESYREL) 50 MG tablet Take one tab nightly by mouth at bedtime as needed for insomnia     No current facility-administered medications for this visit.           Past Medical / Surgical History:  Past Medical History:   Diagnosis Date    Chronic back pain     Congenital hydronephrosis     Gout     Headache     Hematuria     HTN (hypertension) 12/10/2021    Kidney disease     Kidney stones     Major depressive disorder, single episode     Polyarthropathy or polyarthritis of multiple sites     Retinal detachment     Urethral stricture      Past Surgical History:   Procedure Laterality Date    CT INSERTION OF SUPRAPUBIC CATH  09/25/2015    NEPHRECTOMY Right 1955    APPENDECTOMY      COLONOSCOPY      CYSTOSCOPY      CYSTOSCOPY W/ LASER LITHOTRIPSY      OTHER SURGICAL HISTORY      Interstim 01/29/2011    SPINE SURGERY  09/21    Lumbar-sacral fusion    TRANSURETHRAL RESECTION OF PROSTATE           Readiness to change: Ready  Expected Compliance: Good                             Barriers to Learning/Compliance: None  Pt. Verbalized understanding. Patient  confirmed all questions and concerns were addressed to satisfaction, email provided for interim questions.  Monitoring and Evaluation:  - Monitor po intake/diet tolerance  - Monitor BM pattern  - Monitor weight changes.        Follow up in 4-6 weeks, message sent to call to schedule f/u      Riley Lam, RD, CSO    ---------------------(data below generated by Riley Lam, RD)--------------------     Patient Verification & Telemedicine Consent & Financial Waiver:    1.   Identity: I have verified this patient's identity to be accurate.  2.   Consent: I verify consent has been secured in one of the following methods: (a) obtained written/ online attestation consent (via MyChartVideoVisit pathway), (b) the spoke-side provider has obtained verbal or written consent from patient/surrogate (if this is a "provider to provider" evaluation), or (c) in all other cases, I have personally obtained verbal consent from the patient/ surrogate (noting all elements below) to perform this voluntary telemedicine evaluation  (including obtaining history, performing examination and reviewing data provided by the patient).   The patient/ surrogate has the right to refuse this evaluation.  I have explained risks (including potential loss of confidentiality), benefits, alternatives, and the potential need for subsequent face to face care. Patient/ surrogate understands that there is a risk of medical inaccuracies given that our recommendations will be made based on reported data (and we must therefore assume this information is accurate).  Knowing that there is a risk that this information is not reported accurately, and that the telemedicine video, audio, or data feed may be incomplete, the patient agrees to proceed with evaluation and holds [name] harmless knowing these risks.  3.   Healthcare Team: The patient/ surrogate has been notified that other healthcare professionals (including students, residents and Engineer, maintenance) may be involved in this audio-video evaluation.   All laws concerning confidentiality and patient access to medical records and copies of medical records apply to telemedicine.  4.   Privacy: If this is a Restaurant manager, fast food Visit, the patient/ surrogate has received the [place] Notice of Privacy Practices via E-Checkin process.  For all other video visit techniques, I have verbally provided the patient/ surrogate with the [place] web link in [place] (https://health.dDotCom.si.aspx) or Spanish (https://health.LavishToys.ch.aspx).  The patient/ surrogate acknowledges both being provided the NPP link, and has been offered to have the NPP mailed to the patient/ surrogate by [name] mail.  The patient/ surrogate has voiced understanding an acknowledgement of receipt of this NPP web address.  If the patient/surrogate has elected to receive the NPP via [name] mail, I verify that the NPP will be sent promptly to the patient/surrogate via [name] mail.  5.   Capacity: I have reviewed this above verification and consent  paragraph with the patient/ surrogate and the patient is capacitated or has a surrogate. If the patient is not capacitated to understand the above, and no surrogate is available, since this is not an emergency evaluation, the visit will be rescheduled until such time that the patient can consent, or the surrogate is available to consent. If this is an emergency evaluation and the patient is not capacitated to understand the above, and no surrogate is available, I am proceeding with this evaluation as this is felt to be an emergency setting and no appropriate specialist is available at the bedside to perform these evaluations.  6.   Financial Waiver: If this is a Restaurant manager, fast food Visit, the patient has been made  aware of the financial waiver via E-Checkin process.  For all other video visit techniques, an E-Checkin process is not performed.  As such, I have personally verbally informed the patient/ surrogate that this evaluation will be a billable encounter similar to an in-person clinic visit, and the patient/ surrogate has agreed to pay the fee for services rendered.  If we are billing insurance for the patient's telehealth visit, his out-of-pocket cost will be determined based on his plan and will be billed to him.  The patient/ surrogate has also been informed that if the patient does not have insurance or does not wish to use insurance, [place] [address] [employer] price for a primary care telehealth visit is $59.00 and specialist telehealth visit is $88.00.  I have further informed the patient/ surrogate that in the event the patient has additional services provided in conjunction with the specialty visit (Ex. Psychotherapy services), those services will be billed at the current rate less a 45% discount.  7.   Intra-State Location: The patient/ surrogate attests to understanding that if the patient accesses these services from a location outside of [place], that the patient does so at the patient's own risk and  initiative and that the patient is ultimately responsible for compliance with any laws or regulations associated with the patient's use.  8.   Specific Use:The patient/ surrogate understands that [facility] makes no representation that materials or servicesdelivered via telecommunication services, or listed on telemedicine websites, are appropriate or available for use in any other location.           Demographics:  Medical Record #: [id number]  Date: July 26, 2023  Patient Name: Stephen [name]  DOB: 08/04/1951  Age: 72 year old  Sex: male  Location: Home address on file  Patient seen Status: Patient was evaluated via telemedicine (audio or audio/video)     Evaluator(s):  Vandell Goulet was evaluated by me today.    Clinic Location: [facility] OUTPATIENT PAVILION   [facility]  [address]  [place] [address]

## 2023-07-27 ENCOUNTER — Ambulatory Visit (HOSPITAL_BASED_OUTPATIENT_CLINIC_OR_DEPARTMENT_OTHER): Admit: 2023-07-27 | Discharge: 2023-07-27 | Payer: Medicare Other

## 2023-07-27 ENCOUNTER — Ambulatory Visit
Admission: RE | Admit: 2023-07-27 | Discharge: 2023-07-27 | Disposition: A | Discharge status: Home / Self Care | Payer: Medicare Other | Attending: Hematology & Oncology | Admitting: Hematology & Oncology

## 2023-07-27 ENCOUNTER — Other Ambulatory Visit: Payer: Self-pay

## 2023-07-27 ENCOUNTER — Encounter (HOSPITAL_BASED_OUTPATIENT_CLINIC_OR_DEPARTMENT_OTHER): Payer: Self-pay | Admitting: Radiation Oncology

## 2023-07-27 VITALS — BP 130/71 | HR 57 | Temp 98.0°F | Resp 18 | Ht 66.14 in | Wt 158.7 lb

## 2023-07-27 VITALS — BP 126/59 | HR 54 | Temp 97.9°F | Resp 17 | Ht 66.14 in | Wt 160.9 lb

## 2023-07-27 DIAGNOSIS — Z5189 Encounter for other specified aftercare: Secondary | ICD-10-CM

## 2023-07-27 DIAGNOSIS — C662 Malignant neoplasm of left ureter: Secondary | ICD-10-CM

## 2023-07-27 DIAGNOSIS — Z5112 Encounter for antineoplastic immunotherapy: Secondary | ICD-10-CM | POA: Insufficient documentation

## 2023-07-27 DIAGNOSIS — Z95828 Presence of other vascular implants and grafts: Secondary | ICD-10-CM | POA: Insufficient documentation

## 2023-07-27 DIAGNOSIS — C7952 Secondary malignant neoplasm of bone marrow: Secondary | ICD-10-CM

## 2023-07-27 LAB — CBC WITH DIFF, BLOOD
ANC-Automated: 3.3 10*3/uL (ref 1.6–7.0)
ANC-Instrument: 3.3 10*3/uL (ref 1.6–7.0)
Abs Basophils: 0.1 10*3/uL (ref <0.2)
Abs Eosinophils: 0.1 10*3/uL (ref 0.0–0.5)
Abs Lymphs: 1.8 10*3/uL (ref 0.8–3.1)
Abs Monos: 0.6 10*3/uL (ref 0.2–0.8)
Basophils: 0.9 %
Eosinophils: 2.4 %
Hct: 32.6 % — ABNORMAL LOW (ref 40.0–50.0)
Hgb: 11.1 gm/dL — ABNORMAL LOW (ref 13.7–17.5)
Imm Gran %: 0.2 % (ref <1)
Imm Gran Abs: 0 10*3/uL (ref <0.1)
Lymphocytes: 30.8 %
MCH: 30.1 pg (ref 26.0–32.0)
MCHC: 34 g/dL (ref 32.0–36.0)
MCV: 88.3 um3 (ref 79.0–95.0)
MPV: 9.6 fL (ref 9.4–12.4)
Monocytes: 9.4 %
Plt Count: 233 10*3/uL (ref 140–370)
RBC: 3.69 10*6/uL — ABNORMAL LOW (ref 4.60–6.10)
RDW: 15.5 % — ABNORMAL HIGH (ref 12.0–14.0)
Segs: 56.3 %
WBC: 5.8 10*3/uL (ref 4.0–10.0)

## 2023-07-27 LAB — COMPREHENSIVE METABOLIC PANEL, BLOOD
ALT (SGPT): 14 U/L (ref 0–41)
AST (SGOT): 19 U/L (ref 0–40)
Albumin: 4.2 g/dL (ref 3.5–5.2)
Alkaline Phos: 120 U/L (ref 40–129)
Anion Gap: 12 mmol/L (ref 7–15)
BUN: 29 mg/dL — ABNORMAL HIGH (ref 8–23)
Bicarbonate: 28 mmol/L (ref 22–29)
Bilirubin, Tot: 0.78 mg/dL (ref <1.2)
Calcium: 10.1 mg/dL (ref 8.5–10.6)
Chloride: 98 mmol/L (ref 98–107)
Creatinine: 1.39 mg/dL — ABNORMAL HIGH (ref 0.67–1.17)
Glucose: 125 mg/dL — ABNORMAL HIGH (ref 70–99)
Potassium: 4.3 mmol/L (ref 3.5–5.1)
Sodium: 138 mmol/L (ref 136–145)
Total Protein: 7.5 g/dL (ref 6.0–8.0)
eGFR Based on CKD-EPI 2021 Equation: 55 mL/min/{1.73_m2}

## 2023-07-27 MED ORDER — LIDOCAINE HCL (PF) 1 % IJ SOLN
0.3000 mL | INTRAMUSCULAR | Status: DC | PRN
Start: 2023-07-27 — End: 2023-07-27
  Administered 2023-07-27: 0.3 mL via INTRADERMAL

## 2023-07-27 MED ORDER — SODIUM CHLORIDE 0.9 % IV SOLN
Freq: Once | INTRAVENOUS | Status: AC
Start: 2023-07-27 — End: 2023-07-27

## 2023-07-27 MED ORDER — HYDROMORPHONE HCL 2 MG OR TABS
2.0000 mg | ORAL_TABLET | Freq: Once | ORAL | Status: AC
Start: 2023-07-27 — End: 2023-07-27
  Administered 2023-07-27: 2 mg via ORAL
  Filled 2023-07-27: qty 1

## 2023-07-27 MED ORDER — PROCHLORPERAZINE EDISYLATE 5 MG/ML IJ SOLN WRAPPED RECORD
10.0000 mg | Freq: Once | INTRAMUSCULAR | Status: AC
Start: 2023-07-27 — End: 2023-07-27
  Administered 2023-07-27: 10 mg via INTRAVENOUS
  Filled 2023-07-27: qty 2

## 2023-07-27 MED ORDER — SODIUM CHLORIDE 0.9 % IV SOLN
50.0000 mg | Freq: Once | INTRAVENOUS | Status: AC
Start: 2023-07-27 — End: 2023-07-27
  Administered 2023-07-27: 50 mg via INTRAVENOUS
  Filled 2023-07-27: qty 20

## 2023-07-27 MED ORDER — HEPARIN SODIUM LOCK FLUSH 100 UNIT/ML IJ SOLN CUSTOM
500.0000 [IU] | INTRAVENOUS | Status: DC | PRN
Start: 2023-07-27 — End: 2023-07-27
  Administered 2023-07-27: 500 [IU] via INTRAVENOUS

## 2023-07-27 MED ORDER — SODIUM CHLORIDE 0.9 % IJ SOLN (CUSTOM)
20.0000 mL | INTRAMUSCULAR | Status: DC | PRN
Start: 2023-07-27 — End: 2023-07-28
  Administered 2023-07-27: 20 mL via INTRAVENOUS

## 2023-07-27 MED ORDER — HEPARIN SODIUM LOCK FLUSH 100 UNIT/ML IJ SOLN CUSTOM
500.0000 [IU] | INTRAVENOUS | Status: DC | PRN
Start: 2023-07-27 — End: 2023-07-28
  Administered 2023-07-27: 500 [IU] via INTRAVENOUS

## 2023-07-27 MED ORDER — ONDANSETRON HCL 4 MG/2ML IV SOLN
8.0000 mg | Freq: Once | INTRAMUSCULAR | Status: DC
Start: 2023-07-27 — End: 2023-07-27

## 2023-07-27 MED ORDER — SODIUM CHLORIDE 0.9 % IJ SOLN (CUSTOM)
20.0000 mL | INTRAMUSCULAR | Status: DC | PRN
Start: 2023-07-27 — End: 2023-07-27
  Administered 2023-07-27: 20 mL via INTRAVENOUS

## 2023-07-27 NOTE — Interdisciplinary (Signed)
Fast Track Nursing Note - [facility]  72 year old male @ [facility] FAST TRACK for line care and labs.     ICD-10-CM ICD-9-CM   1. Encounter for aftercare  Z51.89 V58.9   2. Ureter malignant neoplasm, left (CMS-HCC)  C66.2 189.2     Chief Complaint:  Line Care and Collect Specimen    Vital Signs:  BP 130/71 (BP Location: Left arm, BP Patient Position: Sitting)   Pulse 57   Temp 98 F (36.7 C) (Temporal)   Resp 18   Ht 5' 6.14" (1.68 m)   Wt 72 kg (158 lb 11.7 oz)   SpO2 100%   BMI 25.51 kg/m   Assessment:   Patient denies fever, chills, NVD, shortness of breath, or coughing. Patient endorses pain in his arm related to disease.   Access:  PAC accessed w/ 20 g .75 inch needle. Labs drawn, line flushed and heparin locked.   Discharge Plan:  No follow-ups on file.  Discharge Mode: Ambulatory  Discharge Time: 1308  Accompanied by: Self  Discharged To: Other: waiting area for lab results   Treatment Administration  Medications   lidocaine (XYLOCAINE) 1% PF injection 0.3 mL (0.3 mL IntraDERMAL Given 07/27/23 1255)   sodium chloride 0.9 % flush 20 mL (20 mL IntraVENOUS Given 07/27/23 1306)   heparin (HEP-LOCK) flush injection 500 Units (500 Units IntraVENOUS Given 07/27/23 1306)

## 2023-07-27 NOTE — Interdisciplinary (Signed)
Chemotherapy Nursing Note - [facility]    Stephen [name] is a 72 year old male who presents for chemotherapy cycle 8, day(s) 8 of Padcev.    Current Chemotherapy Signed Informed Consent verified/obtained yes- 01/06/23    Pre-treatment nursing assessment:  Stephen [name] arrived ambulatory w/walker and is A&Ox4, unaccompanied.   VSS. Reports 8/10 right arm and back pain and just got over some constipation. Denies n/v/d, fever, SOB, or cough.    Patient met treatment parameters.    APP [place] notified- one dose of home Dialudid 2mg  PO ordered and changed premed of Zofran to Compazine to help reduce risk of constipation.  Dilaudid and Compazine adminsitered.  Padcev infused over 30 minutes without issue.  Discharged my Resource RN Bev.    Lab Results   Component Value Date    ABSNEUTRO 3.3 07/27/2023    IANC 3.3 07/27/2023    WBC 5.8 07/27/2023    RBC 3.69 (L) 07/27/2023    HGB 11.1 (L) 07/27/2023    HCT 32.6 (L) 07/27/2023    MCV 88.3 07/27/2023    MCHC 34.0 07/27/2023    RDW 15.5 (H) 07/27/2023    PLT 233 07/27/2023       Lab Results   Component Value Date    NA 138 07/27/2023    K 4.3 07/27/2023    CL 98 07/27/2023    BICARB 28 07/27/2023    BUN 29 (H) 07/27/2023    CREAT 1.39 (H) 07/27/2023    GLU 125 (H) 07/27/2023    [place] 10.1 07/27/2023           Lab Results   Component Value Date    AST 19 07/27/2023    ALT 14 07/27/2023    GGT 131 (H) 01/17/2022    LDH 172 01/19/2022    ALK 120 07/27/2023    TP 7.5 07/27/2023    ALB 4.2 07/27/2023    TBILI 0.78 07/27/2023    DBILI <0.2 01/19/2022        Vitals:    07/27/23 1253 07/27/23 1509 07/27/23 1632   BP:  (!) 125/57 (!) 126/59   BP Location:  Left arm Left arm   BP Patient Position:  Sitting Sitting   Pulse:  60 54   Resp:  17 17   Temp:  97.3 F (36.3 C) 97.9 F (36.6 C)   TempSrc:  Temporal Temporal   SpO2:  98% 99%   Weight: 75 kg (165 lb 5.5 oz) 73 kg (160 lb 15 oz)    Height: 5' 6.14" (1.68 m) 5' 6.14" (1.68 m)      Pain Score: 7  Body surface area is  1.85 meters squared.  Body mass index is 25.87 kg/m.    ECOG  ECOG: 2     Chemotherapy:  Janssen Puls tolerated treatment well.    Post blood return: Brisk  IV access post infusion: NS and Heparin 100 units/ml    Medications   sodium chloride 0.9 % flush 20 mL (20 mL IntraVENOUS Given 07/27/23 1637)   heparin (HEP-LOCK) flush injection 500 Units (500 Units IntraVENOUS Given 07/27/23 1636)   sodium chloride 0.9% infusion (0 mL IntraVENOUS Stopped 07/27/23 1637)   HYDROmorphone (DILAUDID) tablet 2 mg (2 mg Oral Given 07/27/23 1528)   prochlorperazine (COMPAZINE) injection 10 mg (10 mg IntraVENOUS Given 07/27/23 1531)   enfortumab vedotin-ejfv (PADCEV) 50 mg in sodium chloride 0.9 % 100 mL chemo infusion (0 mg IntraVENOUS IV Stop 07/27/23  1914)      Patient Education  Learner: Patient  Barriers to learning: No Barriers  Readiness to learn: Acceptance  Method: Explanation    Treatment Education:       Signs and symptoms of infection, bleeding, adverse reaction(s), symptom control, and when to notify MD.    Patient instructed on post-treatment care and precautions specific to drug(s) administered.   If applicable, education given on vesicant administration risk and signs and symptoms of extravasation (redness, pain, swelling, pressure at IV site) to report immediately to the chemotherapy RN.    Fall Prevention Education: Instructed patient to call for assistance, call light within reach.   Pain Education: Patient instructed to contact nurse if pain should develop or if their current pain therapy becomes ineffective.       Treatment education provided to patient: yes    Response: Verbalizes understanding    Discharge Plan  Discharge instructions given to patient.  Future appointments:date given and reviewed with treatment plan.  Discharge Mode: Ambulatory w/walker  Discharge Time: 1641   Accompanied by: Self  Discharged To: Home

## 2023-07-28 ENCOUNTER — Telehealth (HOSPITAL_BASED_OUTPATIENT_CLINIC_OR_DEPARTMENT_OTHER): Payer: Self-pay

## 2023-07-28 ENCOUNTER — Telehealth (HOSPITAL_BASED_OUTPATIENT_CLINIC_OR_DEPARTMENT_OTHER): Payer: Self-pay | Admitting: Radiation Oncology

## 2023-07-28 NOTE — Telephone Encounter (Signed)
[facility] OUTPATIENT PAVILION / Medical Oncology Social Work Telephone note:    Stephen [name] is well known to this LCSW from prior phone calls and in-person interventions.  He called me on 8/8 to touch base and check in.  He was at the infusion center.  Unfortunately I missed his call.    Today, I returned his call; there was no answer.  I left a message apologizing that I missed his call and let him know I will be OOO until 8/20 and will be available after that.     Kingsley Plan, LCSW  [facility] Social Worker  [phone]

## 2023-07-30 ENCOUNTER — Encounter (HOSPITAL_BASED_OUTPATIENT_CLINIC_OR_DEPARTMENT_OTHER): Payer: Self-pay | Admitting: Family Medicine

## 2023-07-31 ENCOUNTER — Telehealth (HOSPITAL_BASED_OUTPATIENT_CLINIC_OR_DEPARTMENT_OTHER): Payer: Self-pay | Admitting: Radiation Oncology

## 2023-07-31 ENCOUNTER — Telehealth (HOSPITAL_BASED_OUTPATIENT_CLINIC_OR_DEPARTMENT_OTHER): Payer: Self-pay | Admitting: Orthopaedic Surgery of the Spine

## 2023-07-31 ENCOUNTER — Encounter (HOSPITAL_BASED_OUTPATIENT_CLINIC_OR_DEPARTMENT_OTHER): Payer: Self-pay | Admitting: Orthopaedic Surgery of the Spine

## 2023-07-31 NOTE — Telephone Encounter (Signed)
Name of caller: Stephen [name]   Relationship to patient:Self  Interpreter needed:No    Reason for call: Message to RN - FYI only  Pt. calling back in regards to below [facility] Message, Pt. Advised that it was a mix up and Darl Pikes can disregard previous message.       LOV Date & Plan: 07/03/2023  PLAN/DISCUSSION: Reviewed clinical history as well as exam results and imaging findings with patient in detail.      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 We will obtain an MRI thoracic spine to further evaluate these lesions. At this time we would also  recommend continued PT and injections as well as continued observation, and have provided a referral to pain management for evaluation for further injections.        Best number to be reached at: [phone]             Route to RN pool if requesting clinical advice, it can take 24-48 hours turnaround time, route to MA if administrative advice it can take 3-5 business days turn around time.     If an action is needed from the call center please route to P Ortho Scheduling Pool (not directly to cc staff)

## 2023-07-31 NOTE — Telephone Encounter (Signed)
From: [name]  To: Margarita Mail, MD  Sent: 07/30/2023 6:37 PM PDT  Subject: New med list    Hello Dr. Logan Bores I thank you for all your help and support I will see you again in one of my 10+ medical visits my sister is on a week long business trip she is having my niece do my meds please have someone send me the list of my morning and evening meds the list up there is too confusing/ thank you/ see you soon/ [id number]

## 2023-08-01 ENCOUNTER — Encounter (HOSPITAL_BASED_OUTPATIENT_CLINIC_OR_DEPARTMENT_OTHER): Payer: Self-pay | Admitting: Orthopaedic Surgery of the Spine

## 2023-08-02 NOTE — Consults (Addendum)
CONSULTING PHYSICIAN: Gillermo Murdoch, M.D.    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 He was previously being treated at  [facility], but only received 1/10 fx to R shoulder. Patient preferred  having RT restarted here. He is being referred for continuation of  treatment.      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  Gout  Myalgia  Appendectomy ?"  Nephrectomy ?" (as a child for hydronephrosis)  TURP ?"  Spinal surgery ?"    PAST RADIATION HISTORY: 02/07/2023 - 1 fx At [facility]          CARDIAC DEVICE: Denies    CHEMOTHERAPY: Padcev (Day 1 and 8) and Keytruda every 21 days ?" C8D1 8/1    MEDICATIONS: (VMcGrath, RN)  acetaminophen (TYLENOL) 500 MG tablet  albuterol (PROAIR HFA) 108 (90 Base) MCG/ACT inhaler  allopurinol (ZYLOPRIM) 100 MG tablet  Artificial Tear Solution (SOOTHE XP OP)  busPIRone (BUSPAR) 10 MG tablet  busPIRone (BUSPAR) 15 MG tablet  diclofenac (VOLTAREN) 1 % gel  DULoxetine (CYMBALTA) 20 MG CR capsule  fluticasone propionate (FLONASE) 50 MCG/ACT nasal spray  HYDROmorphone (DILAUDID) 2 MG tablet  hydrOXYzine HCL (ATARAX) 25 MG tablet  ibuprofen (MOTRIN) 600 MG tablet  ketotifen (ALAWAY) 0.025 % ophthalmic solution  loperamide (IMODIUM) 2 MG capsule  loratadine (CLARITIN REDITABS) 10 MG dissolvable tablet  LORazepam (ATIVAN) 0.5 MG tablet  Multiple Vitamins-Minerals (MULTIVITAMIN WITH MINERALS) TABS  tablet  naloxone (NARCAN) 4 mg/0.1 mL nasal spray  Nutritional Supplements (NEPRO) LIQD  polyethylene glycol (MIRALAX) 17 g packet  pregabalin (LYRICA) 100 MG capsule  prochlorperazine (COMPAZINE) 10 MG tablet  senna (SENOKOT) 8.6 MG tablet  tizanidine (ZANAFLEX) 2 MG tablet  traZODone (DESYREL) 50 MG tablet  Triaminocolone 0.1% topical cream    ALLERGIES: (Reviewed by VMcGrath, RN)  Sulfa drugs ?" hives    FAMILY HISTORY: Patient is adopted, family history is unknown to him.    SOCIAL HISTORY: Originally from [place], former Market researcher. Lives  with his sister, Rinaldo Cloud. Likes to read and walk his dog, Lucy. Does not  have car/transportation.  Alcohol use: Denies currently  Tobacco use: Never    REVIEW OF SYSTEMS:  Notable findings from this review were:  ROS Constitutional - Complains of fatigue and change in weight previously  lost 40 lbs, now weight slowly improving. Denies lack of appetite. ROS  Eyes - Denies visual difficulties. ROS ENMT - Complains of mouth dryness.  Denies dysphagia, ear pain, epistaxis, problems with hearing and tinnitus.  ROS Integumentary - Complains of rash on LLE, being seen by derm. ROS  Cardiovascular - Denies chest pain, edema and palpitations. ROS  Respiratory - Complains of cough that is dry and chronic. Denies dyspnea  and wheezing. ROS Gastrointestinal - Complains of constipation. Denies  abdominal pain, change in bowel habits, diarrhea, blood in stools, nausea,  pain / cramping and vomiting.  ROS Genitourinary (M) - Complains of  nocturia r/t needing to empty his nephrostomy bag 3-4 times nightly.  Denies urine color change. ROS Musculoskeletal - Complains of arthritis,  bone pain, joint pain, muscle weakness and decreased range of motion. ROS  Neurologic - Complains of abnormal gait. Denies disorientation, dizziness,  headaches, insomnia, memory loss, motor weakness and sensory problems.    CHAPERONE DOCUMENTATION:    FATIGUE SCORE: 1    PERFORMANCE:2 - Ambulatory/capable  of all self-care, unable to perform any  work activities. Up and about more than 50% of waking hours. (ECOG)    REVIEW OF RADIOLOGY: I performed a thorough review of the patient's most  recent radiographic reports during this visit. I also personally reviewed  and interpreted the recent imaging for this patient.    07/19/23 PET Skull Base to Mid Thigh  Bones: Interval metabolic progression of previous numerous predominantly  sclerotic lesions throughout the axial and proximal appendicular skeleton  most pronounced in the sternum, ribs, spine and pelvis with index lesions:  *lower sternum; IM 80 SUVmax 3.5 prior 3.2  *left lateral 5th rib (IM 83 SUVmax 3.0 prior 2.0)  *left T12 (IM 128 SUVmax 4.5 prior 3.8)  *right posterior S2 (IM 190 SUVmax 4.4 prior 4.1)  *right anterior iliac spine) IM 198 SUVmax 5.7 prior 3.4)  *left lesser trochanter (IM 234 SUVmax 3.4 prior 3.7).    IMPRESSION:  1. Compared to prior PET-CT 04/26/23 interval metabolic/anatomic progression  of previous thoracic, mediastinal and abdominal lymph nodes.  2. Interval metabolic progression of previous sclerotic lesions  predominantly in the sternum, ribs, spine, pelvis and left femur.  3. New diffuse increased uptake in the stomach lining which may represent  underlying gastritis and can be followed on subsequent exam.    06/23/23 MRI Cervical Spine  IMPRESSION:  1.  Scattered marrow replacing lesions within the cervical spine  compatible with metastasis without pathologic fracture or epidural  component.  2. Mildly progressed multilevel degenerative changes of the cervical  spine, with moderate spinal canal stenosis at C4-C5, and C5-C6 with  abutment of the ventral cord. No definite underlying abnormal cord signal.  3. Multilevel foraminal stenosis, up to severe left at C3-C4, C4-C5, and  bilateral at C5-C6, and on the right at C6-C7.  5. Multilevel facet arthropathy, with increased enhancement along the left  facet at C4-C5, favored to be  degenerative/inflammatory.    04/26/23 PET Skull Base to Mid Thigh  IMPRESSION:  1. Left percutaneous nephrostomy in place. There is interval increase in  now moderate left hydronephrosis.  2. Multiple small abdominopelvic lymph nodes demonstrate metabolic  activity at or slightly above blood pool and remain nonspecific. In  particular, a periportal lymph node demonstrates slight interval increase  in size and hypermetabolic activity. Continued attention on follow-up is  recommended.  3. Interval development of numerous predominantly sclerotic osseous  lesions throughout the axial and proximal appendicular skeleton. When  compared with prior PET-CT 11/26/2022 many of the previous hypermetabolic  lesions demonstrate interval decrease in metabolic activity and increased  sclerosis, most compatible with positive response to therapy. Diffuse  heterogeneous hypermetabolic activity remains throughout the axial and  appendicular skeleton. This may be related to a background of reactive or  treatment related process. Ongoing hypermetabolic metastasis is not  excluded and continued attention on follow-up is recommended.  4. Interval development of multiple mildly hypermetabolic mediastinal and  hilar lymph nodes. Findings are nonspecific and could represent a  superimposed infectious or inflammatory  process. However, new site of  metastatic disease is not excluded and close attention on follow-up is  recommended.  5. Persistent focal hypermetabolic activity associated with soft tissue  prominence along the left lateral aspect of the distal sigmoid colon.  Findings are in the setting of large colonic stool burden and could  represent stercoral colitis. Correlate with symptoms.    LABORATORY/PATHOLOGY: I performed a thorough review of the patient's most  recent laboratory, and pathology reports during this visit.    12/22/22 Pathology Results  **not the current treatment area  FINAL PATHOLOGIC DIAGNOSIS:  A: Left iliac crest,  biopsy:  - Metastatic urothelial carcinoma.    PHYSICAL EXAM:    VITALS/Constitutional: Well-developed, well-nourished and in no acute  distress.  Performed on 07/26/2023 2:05 PM  Temperature - 97.7 F  Pulse - 74 / min  Respiration - 16 / min  O2 Sat - 98 %  Pain - 9  BP - 109/ 54 mm(hg)  Performed on 07/26/2023 2:09 PM  Weight - 72.6 kg    Head: Normocephalic, atraumatic.  Eyes: Sclerae are non-icteric.  Pupils are equal and round.  Extraocular  movements are intact.  ENMT: Oral cavity and oropharynx are without lesions. Mucous membranes are  moist.  Neck: Supple.  No palpable cervical or supraclavicular adenopathy.  Pulmonary: Clear to auscultation, bilaterally.  No rhonchi, rales or  wheezes.  Cardiovascular: Regular rate and rhythm.   No murmurs heard.  No edema.  GI: Soft, nontender and nondistended.  No palpable masses or  hepatosplenomegaly.  Musculoskeletal: No palpable spinous or paraspinous tenderness.  Range of  motion appears normal in all 4 extremities.  Lymphatics: No palpable cervical, supraclavicular, axillary, or  inguino-femoral adenopathy.  Extremities: No clubbing, cyanosis, or edema.  Skin: No visible lesions.  Neurologic: CN II-XII are grossly intact.  Motor strength is 5/5 in  bilateral upper and lower extremities and symmetric.    Sensory exam is  grossly intact to touch.  Gait is normal.  Psychiatric: Patient is alert and oriented x 3.  Normal mood and affect.  REVIEW OF XRAYS AND LABS: A thorough review of the patient's most recent  radiographic, laboratory, and pathology reports was performed.  Notable  findings were:      ADDITIONAL TESTS/MEDICATION ORDERS: Today I ordered CT simulation      IMPRESSION: Stephen [name] is a 72 year old Male with  bone metastases  to the right shoulder here for transfer of care to continue his radiation  treatment.    RECOMMENDATION/PLAN: Given patient?Ts transportation issues, I recommend a  single fraction of 8 Gy to the right shoulder to complete his  palliative  course. He will be scheduled for simulation.    Plan is subject to change pending further analysis.    INFORMED CONSENT:  I reviewed the rationale and goal of the proposed  treatment course. The radiotherapeutic procedure with associated side  effects and potential complications were explained. The patient, Stephen  BRETTEN [name], had the opportunity to ask questions which were answered to  the best of my knowledge. The patient voiced understanding of the above  and has elected to proceed with treatment.    Radiation requires intensive monitoring consisting of at least weekly  visits during treatment as radiation therapy can cause significant side  effects and toxicity.  These have been discussed with the patient at  length.    cc: Brock Ra, MD    Signature derived from controlled access password  Gillermo Murdoch, MD - PID [id number] 07/31/2023 11:23:04 AM

## 2023-08-03 ENCOUNTER — Other Ambulatory Visit: Payer: Self-pay | Admitting: Cardiovascular Disease

## 2023-08-03 ENCOUNTER — Inpatient Hospital Stay (HOSPITAL_BASED_OUTPATIENT_CLINIC_OR_DEPARTMENT_OTHER): Admit: 2023-08-03 | Discharge: 2023-08-03 | Disposition: A | Discharge status: Home Health | Payer: Medicare Other

## 2023-08-03 DIAGNOSIS — I714 Abdominal aortic aneurysm, without rupture, unspecified: Secondary | ICD-10-CM

## 2023-08-07 ENCOUNTER — Other Ambulatory Visit (HOSPITAL_BASED_OUTPATIENT_CLINIC_OR_DEPARTMENT_OTHER): Payer: Self-pay | Admitting: Family Medicine

## 2023-08-07 DIAGNOSIS — C7951 Secondary malignant neoplasm of bone: Secondary | ICD-10-CM

## 2023-08-07 DIAGNOSIS — G893 Neoplasm related pain (acute) (chronic): Secondary | ICD-10-CM

## 2023-08-08 ENCOUNTER — Encounter (HOSPITAL_BASED_OUTPATIENT_CLINIC_OR_DEPARTMENT_OTHER): Payer: Self-pay | Admitting: Hematology & Oncology

## 2023-08-08 ENCOUNTER — Telehealth (HOSPITAL_BASED_OUTPATIENT_CLINIC_OR_DEPARTMENT_OTHER): Payer: Self-pay

## 2023-08-08 DIAGNOSIS — C662 Malignant neoplasm of left ureter: Secondary | ICD-10-CM

## 2023-08-08 NOTE — Telephone Encounter (Signed)
Dr. Roseanne Reno and [name], [place],     Stephen [name] is scheduled for infusion of Padcev/Keytruda on 08/23.     The patient has a scheduled clinic visit on 08/22.    The following items are currently outstanding:    Orders are unsigned      *As a reminder of our infusion center process, if an unsigned order and/or consent are not addressed 1 business day before the infusion center appointment, the patient appointment will be cancelled.      Thank you,   Gerri Spore, RN [name]

## 2023-08-09 ENCOUNTER — Encounter (HOSPITAL_BASED_OUTPATIENT_CLINIC_OR_DEPARTMENT_OTHER): Payer: Self-pay | Admitting: Family Medicine

## 2023-08-09 DIAGNOSIS — G893 Neoplasm related pain (acute) (chronic): Secondary | ICD-10-CM

## 2023-08-09 DIAGNOSIS — C7951 Secondary malignant neoplasm of bone: Secondary | ICD-10-CM

## 2023-08-09 MED ORDER — HYDROMORPHONE HCL 2 MG OR TABS
2.0000 mg | ORAL_TABLET | ORAL | 0 refills | Status: DC | PRN
Start: 2023-08-09 — End: 2023-08-28

## 2023-08-09 NOTE — Telephone Encounter (Signed)
Refill request    Dilaudid 2 mg 1 q 4 hrs prn pain # 60 last filled 7/26    Last visit 7/8 Dr, Logan Bores  Next visit 8/26 Dr. Logan [facility]    [facility]    Routing to provider

## 2023-08-09 NOTE — Telephone Encounter (Signed)
Request was managed. Closing encounter

## 2023-08-09 NOTE — Telephone Encounter (Signed)
Palliative Care NP Note    RN note reviewed.   Pt requesting refill for Dilaudid 2mg    Last Filled: 7/28 #60  CURES reviewed today. No aberrant activity.  Rx [facility] Dilaudid 2mg  #[facility].       Davy Pique, NP-C  [facility] Palliative Care Service  T: [phone]  [email or web address]

## 2023-08-09 NOTE — Telephone Encounter (Signed)
From: [name]  To: Margarita Mail, MD  Sent: 08/09/2023 11:38 AM PDT  Subject: medications    In regards to your message I take an Dilaudid in the

## 2023-08-10 ENCOUNTER — Other Ambulatory Visit: Payer: Self-pay

## 2023-08-10 ENCOUNTER — Encounter (HOSPITAL_BASED_OUTPATIENT_CLINIC_OR_DEPARTMENT_OTHER): Payer: Self-pay | Admitting: Hematology & Oncology

## 2023-08-10 ENCOUNTER — Ambulatory Visit: Payer: Medicare Other | Attending: Hematology & Oncology | Admitting: Hematology & Oncology

## 2023-08-10 ENCOUNTER — Telehealth (HOSPITAL_BASED_OUTPATIENT_CLINIC_OR_DEPARTMENT_OTHER): Payer: Self-pay | Admitting: Hematology & Oncology

## 2023-08-10 VITALS — BP 133/64 | HR 54 | Temp 96.7°F | Resp 16 | Ht 66.14 in | Wt 160.0 lb

## 2023-08-10 DIAGNOSIS — C7910 Secondary malignant neoplasm of unspecified urinary organs: Secondary | ICD-10-CM | POA: Insufficient documentation

## 2023-08-10 NOTE — Patient Instructions (Signed)
Patient Instructions:    -- Stop treatment    --Trial Coordinator will reach out to you tomorrow        Have questions about Advanced Care Planning/Advance Directive?  PREPARE ([email or web address])           Future Appointments   Date Time Provider Department Center   08/14/2023  2:00 PM Shea Evans, MD M[facility] Onc [facility]   08/16/2023  2:20 PM [facility] M[facility] RAD ONC MUC   08/19/2023  8:00 AM [facility] MRI 2 [facility]   08/20/2023  4:00 AM RAD ONC BILLING SERIES MUC RAD ONC MUC   09/01/2023  1:30 PM Riley Lam, RD [facility] BREAST [facility]   09/18/2023 11:00 AM Zlomislic, Alger Memos, MD [facility] Ortho [facility]   09/19/2023  4:00 AM RAD ONC BILLING SERIES MUC RAD ONC MUC         Visit this website for Cancer resources at our [facility].  This includes information on [place] Chemo Support Groups, Mind/Body Wellness, Treatment Education Classes, and other [place] Cancer Services:      [email or web address]       *Chief Operating Officer Website:  [email or web address]    Contact Information:   Brock Ra, MD   Assistant Professor of Medicine  Medical Oncology  Genitourinary Malignancy      Franco Nones, M.S., PA-C  Sr. Physician Assistant    Clinical questions or concerns  Oncology Nurse Case Manager   Elsworth Soho RN, BSN  T: [phone]  Department of Urology   [facility]  [address]  [place], [place] [address]    Dr. Roseanne Reno Follow Up Appointment Assistance  Administrative Assistant   Terence Lux  T: [phone]  F: [phone]    Monday-Friday 8:00- 4:30p.m. (Closed Holidays and Weekends)  Please listen to message carefully for daily information.   Leave a detailed message with your name, medical record number and   telephone number. Messages are usually returned within 24 hours.     [facility] Phone List     AFTER HOURS EMERGENCY NUMBER   Ask  for the Oncologist Physician On-Call.                   [phone]    Outpatient Pavilion Scheduling line: [phone]  Call for information about our new location and to schedule or cancel appointments.    [facility] Scheduling line: [phone]   Call to schedule your [name], CT Scans or MRI studies.    PET Scan Scheduling line: [phone]  Call to schedule your PET scan    Prostate MRI Scheduling line: [phone] or [phone]  Call to schedule your prostate MRI.   Performed at the [facility] ([facility]).    Nuclear Medicine Scheduling Line [phone]   Call to schedule your bone scan, Radium 223     [facility] Infusion Center: [phone]   Call to schedule, cancel, or reschedule chemotherapy appointments.  Infusion Center Hours--- Monday-Friday 7:00-7:30 p.m.; Sat-Sun 8:00-4:30pm.    [facility] ([address]): [phone]  Monday-Friday 730-530 p.m.; Closed Weekends    Radiation Oncology: [phone]    Call to schedule, cancel, or reschedule radiation appointments    Social Worker  [place]: Mathews Rob[place], [place]    T: [phone]  [facility]: Henri Medal, LCSW  T: [phone]    Financial Counselors   [phone] at [facility]  Cancer Center   [phone] or [phone]      [facility]  [address], [place] [address]    -You can also reach [name] by MyChart. To set up your MyChart please see the following site: [email or web address]  -For cancer support services, please visit out Patient and [facility].  -For [facility] support services, please visit:  -[email or web address]  -For other support from the American Cancer Society, please visit: [email or web address]/index              Thank you for allowing [name] to participate in your care today! We value your feedback as we strive to provide every patient  with an exceptional care experience.     You may receive a patient satisfaction survey in the mail in the next few weeks. Please fill out the survey upon receipt and return it in the self-addressed envelope. Your feedback will be used to help improve the experience for all our patients at [facility].     If you want to share a positive experience you had at our facility please call We Listen at [phone]. If you have any suggestions on how we can improve our services please call the [facility] leadership team at [phone].    For medical questions or emergencies, please call your physician's office or 911. Thank you!

## 2023-08-10 NOTE — Progress Notes (Signed)
Name: Stephen [name]   Date of Birth: 1951/05/22  Medical Record Number: [id number]    [facility]     Patient ID: Metastatic urothelial carcinoma    Stage: Cancer Staging   Ureter malignant neoplasm, left (CMS-HCC)  Staging form: Renal Pelvis And Ureter, AJCC 8th Edition  - Clinical: Stage IV (cTX, cN0, pM1) - Signed by Cheri Guppy, MD on 12/01/2022    Oncologic History:    04/22/21  CTU: filling defect in L renal pelvis  05/05/21  Cysto with L ureteroscopy revealed a L papillary tumor, biopsied: mixed high (30%) and low grade urothelial carcinoma  06/16/21  Repeat cysto, papillary tumor: pathology low grade papillary [place]  09/xx/22  Completed induction with mitogel (6 cycles)  08/19/21  HGTa of the bladder, no muscle  08/26/21  Repeat TURBT: NED, muscle identified  10/26/21  MRU: mild thickening in the L renal pelvis  12/24/21  Cysto/L ureteroscopy: dysplasia in the bladder; no disease in L ureter  05/06/22  L PCN placed  06/01/22  TURBT and L ureteroscopy: NED, stricture in place, stent placed  09/29/22  TURBT and L ureteroscopy: NED  10/22/22  MRU: Mild diffuse thickening in the L ureter/renal pelvis. Multiple osseous lesions involving the bilateral iliac crest and right acetabulum concerning for osseous metastatic disease.   11/26/22  PET/CT: diffuse osseous FDG activity with discrete osseous lesions concerning for metastatic disease.  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  Also treated for septic shock, AKI (Cr 2.5) and acute liver injury (AST/ALT 2800/2100).   S/p exchange L PCN.   02/07/23  start XRT to R Shoulder  02/10/23  C2D1 EV (dose reduced to 1.0 mg/kg) + pembro 200 mg  IV  03/03/23  C3D1 EV/pembro, missed D8  03/06/23  Cysto: NED.  Cytology NED.   03/14/23  Seen at [facility] ED for dislodged tube, replaced  04/02-05/24  Admitted with weakness, poor PO intake.   04/06/23  C4D1 EV/pembro  04/13/23  C4D8 EV  04/26/23  PET/CT: osseous lesions much improved, many new sclerotic lesions c/w treatment change.  Nonspecific mediastinal and pelvic LN  05/09-11/24  Admitted for treatment of AKI and rhabdomyolysis  05/11/23  C5D1 EV/P DR to 0.75  06/01/23  C6D1 EV/Pembro 0.75  06/23/23  MRI C-Spine: disease in spine, but no fracture or epidural involvement.  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  He is eating.  "My back is so bad"  L PCN is "doing well"  "I always have tingling" and "I always have tingling in my hands"  Using walker to walk. He has not had any more falls.     Review of Systems:   A complete ROS was performed and is negative except as documented above.     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  Non smoker  No etoh    Medications:  Current Outpatient Medications on File Prior to Visit   Medication Sig Dispense Refill    acetaminophen (TYLENOL) 500 MG tablet Take 2 tablets (1,000 mg) by mouth every 8 hours as needed for Mild Pain (Pain Score 1-3) or Moderate Pain (Pain Score 4-6). 40 tablet 0    albuterol (PROAIR HFA) 108 (90 Base) MCG/ACT inhaler ProAir HFA 90 mcg/actuation aerosol inhaler      allopurinol (ZYLOPRIM)  100 MG tablet Take 1 tablet (100 mg) by mouth daily. 30 tablet 0    Artificial Tear Solution (SOOTHE XP OP)       busPIRone (BUSPAR) 10 MG tablet TAKE 1 TAB TWICE DAILY 60 tablet 0    busPIRone (BUSPAR) 15 MG tablet Take 1 tablet (15 mg) by mouth 2 times daily. 180 tablet 1    diclofenac (VOLTAREN) 1 % gel Apply 2 g topically every 6 hours as needed (Right knee pain). 100 g 0    DULoxetine (CYMBALTA) 20 MG CR capsule Take 1 capsule (20 mg) by mouth 2 times daily. 180 capsule 2    fluticasone propionate (FLONASE) 50 MCG/ACT nasal spray 2 sprays.      HYDROmorphone (DILAUDID) 2 MG tablet Take 1 tablet (2 mg) by mouth every 4 hours as needed for Moderate Pain (Pain Score 4-6) or Severe Pain (Pain Score 7-10). 60 tablet 0    [DISCONTINUED] HYDROmorphone (DILAUDID) 2 MG tablet Take 1 tablet (2 mg) by mouth every 4 hours as needed for Moderate Pain (Pain Score 4-6) or Severe Pain (Pain Score 7-10). 60 tablet 0    hydrOXYzine HCL (ATARAX) 25 MG tablet Take 1 tablet (25 mg) by mouth every 8 hours as needed for Anxiety. 30 tablet 2    ibuprofen (MOTRIN) 600 MG tablet       ketotifen (ALAWAY) 0.025 % ophthalmic solution Place 1 drop into both eyes 2 times daily.      loperamide (IMODIUM) 2 MG capsule For mild to moderate diarrhea (less than 5 BMs within 24 hours):  Take 1 capsule (2 mg) by mouth 4 times daily as needed for Diarrhea.  For severe Diarrhea (5 or more medium to large BMs within 24 hours):  take 2 capsules and then 1 capsule every 2 hours until diarrhea stops. 90 capsule 0    loratadine (CLARITIN REDITABS) 10 MG dissolvable tablet       LORazepam (ATIVAN) 0.5 MG tablet Take 1 tablet by mouth 30 to 60 minutes prior to MRI.  May repeat dose once if needed. 2 tablet 0    Multiple Vitamins-Minerals (MULTIVITAMIN WITH MINERALS) TABS tablet Take 1 tablet by mouth daily. 30 tablet 1    naloxone (NARCAN) 4 mg/0.1 mL nasal spray For suspected opioid overdose, call 911! Then spray once in one nostril. Repeat after 3 minutes  if no or minimal response using a new spray in other nostril. 2 each 0    Nutritional Supplements (NEPRO) LIQD Take 3 Cans by mouth daily. 30000 mL 11    polyethylene glycol (MIRALAX) 17 g packet Take 1 packet (17 g) by mouth 2 times daily as needed  for Constipation. 1 each 0    pregabalin (LYRICA) 100 MG capsule Take 1 capsule (100 mg) by mouth at bedtime. 30 capsule 0    prochlorperazine (COMPAZINE) 10 MG tablet Take 1 tablet (10 mg) by mouth every 6 hours as needed (Nausea/Vomiting). 30 tablet 5    senna (SENOKOT) 8.6 MG tablet Take 2 tablets (17.2 mg) by mouth 2 times daily. 30 tablet 0    tizanidine (ZANAFLEX) 2 MG tablet Take 1 tablet (2 mg) by mouth every 8 hours as needed (muscle spasm). 30 tablet 2    traZODone (DESYREL) 50 MG tablet Take one tab nightly by mouth at bedtime as needed for insomnia 90 tablet 0     No current facility-administered medications on file prior to visit.       Physical Examination:   08/10/23  1355   BP: 133/64   Pulse: 54   Temp: 96.7 F (35.9 C)   Resp: 16   SpO2: 100%     ECOG PS 1  General: NAD  HEENT: Moist mucous membranes.   Neck: No thyromegaly or lymphadenopathy.   Chest: Clear to ausculation bilaterally. No wheezes, rales or rhonchi.    Cardiac: Regular rate and rhythm without any murmurs, rubs or gallops.   Abdomen: Abd soft, non-tender. +BS.   Extremities: No clubbing, cyanosis. No edema  Neurological: Awake, alert and oriented x 3. Strength and sensation are grossly intact.   Psychologic: Appropriate mood and affect.  GU: L PCN site clean and dry, draining clear, yellow urine.     Labs: Following labs reviewed  Lab Results   Component Value Date    WBC 5.8 07/27/2023    RBC 3.69 (L) 07/27/2023    HGB 11.1 (L) 07/27/2023    HCT 32.6 (L) 07/27/2023    MCV 88.3 07/27/2023    MCHC 34.0 07/27/2023    RDW 15.5 (H) 07/27/2023    PLT 233 07/27/2023    MPV 9.6 07/27/2023      Lab Results   Component Value Date    BUN 29 (H) 07/27/2023    CREAT 1.39 (H) 07/27/2023    CL 98  07/27/2023    NA 138 07/27/2023    K 4.3 07/27/2023    [place] 10.1 07/27/2023    TBILI 0.78 07/27/2023    ALB 4.2 07/27/2023    TP 7.5 07/27/2023    AST 19 07/27/2023    ALK 120 07/27/2023    BICARB 28 07/27/2023    ALT 14 07/27/2023    GLU 125 (H) 07/27/2023                Imaging:    PET CT 5/08:  1. Left percutaneous nephrostomy in place. There is interval increase in now moderate left hydronephrosis.  2. Multiple small abdominopelvic lymph nodes demonstrate metabolic activity at or slightly above blood pool and remain nonspecific. In particular, a periportal lymph node demonstrates slight interval increase in size and hypermetabolic activity. Continued attention on follow-up is recommended.  3. Interval development of numerous predominantly sclerotic osseous lesions throughout the axial and proximal appendicular skeleton. When compared with prior PET-CT 11/26/2022 many of the previous hypermetabolic lesions demonstrate interval decrease in metabolic activity and increased sclerosis, most compatible with positive response to therapy. Diffuse heterogeneous hypermetabolic activity remains throughout the axial and appendicular skeleton. This may be related to a background of reactive or treatment related process. Ongoing hypermetabolic metastasis is not excluded and continued attention on follow-up is recommended.  4. Interval  development of multiple mildly hypermetabolic mediastinal and hilar lymph nodes. Findings are nonspecific and could represent a superimposed infectious or inflammatory process. However, new site of metastatic disease is not excluded and close attention on follow-up is recommended.  5. Persistent focal hypermetabolic activity associated with soft tissue prominence along the left lateral aspect of the distal sigmoid colon. Findings are in the setting of large colonic stool burden and could represent stercoral colitis. Correlate with symptoms.      Assessment:  Kent Grabinski is a 72 year old male  with OA, HTN, MDD with L UTUC with metastatic disease (NU2V2Z3G), with osseous mets (Pet/CT with diffuse enhancement and discrete lesions). FGFR-TACC mutation.  Started on EV+Pembro.    Scans after 4 cycles with improvement of osseous lesions, but demonstrate substantial sclerotic osseous lesions, suggesting treated lesions not scene prior.    I personally reviewed labs as above.  I personally reviewed scans as above.    Here prior to C9 EV+Pembro.  His PET/CT shows POD.  We discussed the nature of progression and that this represents a significant change.  I discussed that there are treatment options, that have a range of response on the order of 20-40%.  He has a FGFR-TACC mutatoin.  We discussed chemotherapy, but he is not an excellent candidate for chemotherapy, particularly platinum based therapy.  We dsicussed data for erdafitinib.  We also discussed the [id number] study, a phase 1 study evaluating a novel FGFR inhibitor.  See narrative below.     Advanced Urothelial Carcinoma, [id number]  - hold further EV/P  - screen for ABSK clinical trial  - hold on XRT to R shoulder as we screen for clinical trial, can do this after he goes on study.   - consider adding zometa given significnt bone disease; needs dental clearance - he's known to have significant dental issues so this needs to be cleared first    Cancer related pain  - follows with palliative care, on multiple medications for pain including dilaudid  - caution given multiple events of AMS    R shoulder pain  - 2/2 metastatic lesion  - seen by ortho 1/22  - XRT as above    Anxiety  - f/u with palliative care  - monitor closely    RLE edema  - improved    L PCN, initially placed 04/2022  - draining well, will monitor    This visit was spent addressing a complex, chronic illness that poses a threat to life or bodily function to this patient.  I independently reviewed the patient's history since prior visit, reviewed intervening labs and imaging, counseled the  patient on the management of their chronic illness, and placed orders to be done prior to next visit.     Asa Saunas. Roseanne Reno, M.D.  Assistant Professor of Medicine   [place] [facility] [place], [facility]  [facility]  [address]  [place], [place] [address]       Sub-I Narrative      Sponsor / Study title: [employer] / "A Phase 1, Open-Label Study of [id number] to Assess Safety, Tolerability, and Pharmacokinetics in Patients with Advanced Solid Tumors"      IRB: [phone]      I reviewed Mr. Wataru Alvillar current clinical, laboratory and imaging data and the patient is a potential candidate for IRB [phone].     I discussed standard of care, and clinical trial options as appropriate with the patient on  10-Aug-2023. I reviewed with the patient the currently available clinical trials at our institution relevant to the patient's medical condition in relation to the following: Main objectives of the trial, available therapeutic agent, research phase of the trial, known side effects, contraception usage as applicable and explained that there may be unforeseeable risks.     Additionally, it was discussed that while I have initially reviewed the patient's medical record to determine the patient is potentially eligible for the trial, further evaluation during the screening period may reveal new information excluding the patient from participation.     We discussed that the routine care costs procedures are billed to their medical insurance while the research specific services are covered by the study sponsor.     The patient was advised to take the necessary time to make an informed decision about participating in the trial. I provided the patient with the opportunity to ask any questions in reference to the study which I answered to the patient's satisfaction. I emphasized that if the patient is ineligible for or decides not to participate in the study, I would continue to be  their treating physician.     The research coordinator met with the patient to further discuss the informed consent and study details. A consent in the patient's preferable literature language was provided. The patient was encouraged to contact the research staff or myself with any questions or concerns related to the study.

## 2023-08-11 ENCOUNTER — Ambulatory Visit
Admission: RE | Admit: 2023-08-11 | Discharge: 2023-08-11 | Disposition: A | Discharge status: Home / Self Care | Payer: Medicare Other

## 2023-08-11 ENCOUNTER — Ambulatory Visit (HOSPITAL_BASED_OUTPATIENT_CLINIC_OR_DEPARTMENT_OTHER): Payer: Medicare Other

## 2023-08-14 ENCOUNTER — Ambulatory Visit: Payer: Medicare Other | Attending: Family Medicine | Admitting: Family Medicine

## 2023-08-14 VITALS — BP 117/80 | HR 56 | Temp 98.0°F | Resp 16 | Ht 66.14 in | Wt 161.3 lb

## 2023-08-14 DIAGNOSIS — Z9189 Other specified personal risk factors, not elsewhere classified: Secondary | ICD-10-CM | POA: Insufficient documentation

## 2023-08-14 DIAGNOSIS — G893 Neoplasm related pain (acute) (chronic): Secondary | ICD-10-CM | POA: Insufficient documentation

## 2023-08-14 DIAGNOSIS — Z9181 History of falling: Secondary | ICD-10-CM | POA: Insufficient documentation

## 2023-08-14 DIAGNOSIS — T402X5A Adverse effect of other opioids, initial encounter: Secondary | ICD-10-CM | POA: Insufficient documentation

## 2023-08-14 DIAGNOSIS — R1310 Dysphagia, unspecified: Secondary | ICD-10-CM | POA: Insufficient documentation

## 2023-08-14 DIAGNOSIS — Z515 Encounter for palliative care: Secondary | ICD-10-CM | POA: Insufficient documentation

## 2023-08-14 DIAGNOSIS — K5903 Drug induced constipation: Secondary | ICD-10-CM | POA: Insufficient documentation

## 2023-08-14 DIAGNOSIS — F419 Anxiety disorder, unspecified: Secondary | ICD-10-CM | POA: Insufficient documentation

## 2023-08-14 DIAGNOSIS — R413 Other amnesia: Secondary | ICD-10-CM | POA: Insufficient documentation

## 2023-08-14 DIAGNOSIS — C7910 Secondary malignant neoplasm of unspecified urinary organs: Secondary | ICD-10-CM | POA: Insufficient documentation

## 2023-08-14 DIAGNOSIS — R5383 Other fatigue: Secondary | ICD-10-CM | POA: Insufficient documentation

## 2023-08-14 DIAGNOSIS — M47812 Spondylosis without myelopathy or radiculopathy, cervical region: Secondary | ICD-10-CM | POA: Insufficient documentation

## 2023-08-14 DIAGNOSIS — G4700 Insomnia, unspecified: Secondary | ICD-10-CM | POA: Insufficient documentation

## 2023-08-14 DIAGNOSIS — C7951 Secondary malignant neoplasm of bone: Secondary | ICD-10-CM | POA: Insufficient documentation

## 2023-08-14 DIAGNOSIS — M5412 Radiculopathy, cervical region: Secondary | ICD-10-CM | POA: Insufficient documentation

## 2023-08-14 NOTE — Patient Instructions (Addendum)
[facility] Palliative Care       PLAN OF CARE:    -We will increase hydromorphone to 2mg  2 tablets (4mg ) by mouth every 4 hours as needed for pain.  I recommend you try taking this around the clock for pain.  -Continue duloxetine CR (Cymbalta) 20mg  by mouth 2 times a day  -Continue pregabalin (Lyrica) 100mg  by mouth 2 times a day  -Continue Senna 1 tablet by mouth 2 times a day  -Continue docusate 100mg  by mouth twice a day as needed for constipation  -Continue Miralax 1 tablespoon in water daily as needed for constipation  -Continue prochlorperazine 10mg  by mouth every 6 hours as needed for nausea  -Continue follow up with Psychiatry. We can help you establish with a new psychiatrist.    We will see you for follow up on 09/04/23 at 9 AM in person at [facility] (60 minutes).    List of Palliative Care Providers:  Theda Belfast, NP  Gaspar Skeeters, NP  Margarita Mail, MD  Davy Pique, NP  Vivien Presto, NP  Novella Rob, MD  Berenice Primas, MD  Clint Guy MD  Era Skeen, DO FAAHPM  Awilda Metro, MD    Administrative Assistant/Team Coordinator   Jeannetta Ellis   Alcide Clever     Social Workers:   Carleene Mains, [place]    Spiritual Counselor/Chaplain  Lehman Prom, [facility]   Dan Europe, [place] CME     Nurse Case Managers  Sherald Barge, RN   Caralee Ates, RN  Reva Pedro Earls, RN   Georjean Mode, [place]  .............................................................................................................................................      HOW TO CONTACT PALLIATIVE CARE  NURSE LINE--------[phone]--leave a voicemail and we will call you back  SCHEDULING------[phone]  FAX--------------------[phone]   [place]------Use MyChart Messaging for non-emergent issues  ?   HOURS: Monday through Friday 8:00 am - 5:00 pm?(closed holidays and weekends)      AFTER HOURS: For urgent symptom issues after hours, call [phone] and ask for the Doctor-on-Call for Palliative Care.   ?   Emergency: Call 911  or go to the nearest emergency room.?   .............................................................................................................................................   ?  [place] Help Desk: [phone]      ............................................................................................................................................      Special Instructions Regarding Medications:   ?   1. Prescription Refills       -4 days before running out of your medication, NOTIFY us                 Use [phone] or via MyChart message.                 We can Mail you palliative care prescriptions if you like, they are managed by the [facility] mail order pharmacy                Please let us know 4 business days in advance of running out    Mail order refills are ONLY delivered Monday-Friday (no holidays)     We will need to know the delivery address     Someone will need to be home to sign for the medication (will need to show drivers license and sign for delivery)    2. -Pain medication Agreement                Pain medications to treat cancer-relate pain should only be prescribed by [place] palliative care unless you are in the ER               We must  see you regularly to assess you and safely prescribe               If you miss an appointment or need to cancel/reschedule please call us right away at [phone]  ?   2. Storage                Medicines should be stored in the original bottles in a cool, dry place.                 Please keep them securely out of reach of children and pets.   ?   3. Disposal: Please dispose of your medicines safely. Here is a link to safe disposal sites in [place]: http://trevino.com/.php    ?   4. Precaution: Please keep all medications out of reach of children.   ?   .............................................................................................................................................?   Financial  Counselors  [phone] [facility]  [phone] or [phone]     Advance Care Planning   ?   Many patients ask about the differences between an Advance Directive and a POLST.       Here is a Lobbyist that clarifies the differences between these documents:    [email or web address] ?      Differences between an Advance Directive and a POLST are:    An Advance Directive is for:    1. Anyone 55 years of age or older    2. Provides instructions for future treatment    3. Appoints a health care representative    4. Guides inpatient treatment decisions when make available       A POLST is for:    1. Anyone with a serious illness - any age   2. Provides medical orders for current treatment    3. Guides actions by Emergency Medical Personnel when made available    4. Guides inpatient treatment decisions when made available   5. This is a bright pink form that should be placed on your refrigerator    .............................................................................................................................................      Patient Experience Department   ?   The Patient Experience Department acts as a bridge between our patients, hospitals and physicians to respond to concerns, issues and requests. Patient experience specialists are here to help patients and their families communicate their experiences with [facility] and navigate our health system. Please call [phone] or send an email to [email or web address].    ..............................................Marland Kitchen     MyPath     We are excited to share our new [facility] application: MyPath. MyPath will guide you through your unique cancer journey and connect you with our expansive network of curated support services and resources. It is available in [place] and [place].     You can download the MyPath application in four easy steps:     1.        Use the QR code to open your app store.  2.       Click on the MyPath app icon and install on your device.  3.       Open the MyPath app.  4.       Enter your  username and password to log in.

## 2023-08-14 NOTE — Progress Notes (Unsigned)
[facility] Service Outpatient Progress Note    Requested by Dr. Brock Ra    CC: Pain, psychosocial support    ID: Stephen [name] is a 72 year old male with a history of metastatic urothelial carcinoma, OA, HTN, MDD, gout referred to Palliative Care clinic for symptom management and psychosocial support.    Interval Events:  -Stephen [name] had a follow up with Oncology (Dr. Roseanne Reno) on 08/10/23.  Per review of note, planned to continue treatment with enfortumab and pembrolizumab.  Stephen [name] had an evaluation with Radiation Oncology (Dr. Lodema Hong) on 07/26/23.  Per review of note, planned for XRT single fraction to right shoulder.    Subjective:    Today we met with patient in palliative care clinic.  We addressed several issues during their visit:     #) Pain: See description below.   Location: Right upper extremity; also endorses pain all over his body  Quality: Sharp, aching  Severity:   Current pain score: 9  Best pain score: 7  Duration/Timing: constant  Modifying factors       Aggravating factors: sitting down, activities       Alleviating factors: tizanidine, hydromorphone, pregabalin    -Reports pain is not controlled today  -Not taking Ibuprofen PRN or acetaminophen PRN, not helping with pain  -Takes duloxetine CR 20mg  PO BID  -Taking pregabalin 100mg  PO BID (unclear if taking only 1 time per day or 2 times per day)  -Taking hydromorphone 2mg  PO q4h PRN, takes 2-3 times a day, does not seem to help his pain at this dose.  -Not taking Ibuprofen PRN, not taking acetaminophen PRN  -Planned for XRT to R shoulder, has been lost to f/u in the past  -Sister helps him to organize a lot of his medications  -Taking tizanidine 2mg  PO q8h PRN muscle spasms, does not take every day, in past has thought it helped but really not as sure anymore.  -Taking diclofenac 1% topical QID PRN pain which still is helpful, only uses from time to time  -Also has followed with Pain Management  -Denies any adverse effects of the hydromorphone,  pregabalin, duloxetine or tizanidine such as sedation, lethargy, myoclonus or pruritus.  Not currently having falls but in the past has reported issues with frequent falls, which we need to monitor closely  -Per CURES: hydromorphone 2mg  #60 on 08/11/23, pregabalin 100mg  #30 on 07/30/23, lorazepam 0.5mg  #2 on 07/16/23    #) Bowel movements:  -Occasionally has constipation  -Not taking Senna or Miralax.  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    -Does not report any new falls since last visit, but at last visit was reporting frequently falls    #) Nephrostomy tube:  -Has nephrostomy tube exchange on 08/23/23  -No fevers, chills, abdominal pain, flank pain reported today  -Urine has been clear without any hematuria or abnormal color/odor per patient, we examined today and confirmed this    #) Anxiety:  -Endorses significant anxiety today which he attributes to having recently been told his cancer was worsening and having discussed treatment options  (including potential clinical trial per patient)  -Wakes up frequently during the middle of the night to drain nephrostomy bags  -Takes trazodone 50mg  PO nightly  -Taking Buspar 15mg  PO BID, unsure if helping  -Stopped taking hydroxyzine, in the past has helped but today he reports it doesn't help with anxiety  -Discussed that we can try additional anxiolytics but Stephen [name] declines today.  He seems to have been lost to f/u since Dr. Reina Fuse left [place], will check with Psychiatry team if they can touch base with Stephen [name]  -Will avoid stronger anxiolytics such as lorazepam given hx of memory problems, frequent falls and hx of possible opioid overdose unless assessed to be indicated per Psychiatry    #) Urothelial carcinoma:  -Mentions that he had a difficult visit with Dr. Roseanne Reno recently, was told he had 3 treatment options but can only remember clinical trial and chemotherapy being options.  Mentions he is planning to enroll in a clinical trial (or at least to discuss it)    #) Possible left inguinal hernia:  -Saw Surgery in 06/2023, hernia found to be reducible, no surgical intervention indicated    #) Psychosocial domain:  -Using medical transportation to get to appointments  -Walking with a walker, adamantly declines a wheelchair or PT or home evaluation at previous appointments  -Enjoys taking his sister's dog for walks  -Enjoys going to the beach, taking walks  -Sister continues to be his main source of support and helps him to manage his medications    #) Advance care planning:  -Health care agent: Colman Cater (sister)  -Has advance directive on file from 2019 which is notarized.    #) Medication list:  -Stephen [name] mentions his medication list is hard to read, we prepared a new medication list for him today.    Morning:  -Multivitamin  -Allopurinol 100mg   -Duloxetine (Cymbalta) 20mg   -Senna S 50mg -8.6  -Pregabalin (Lyrica) 100mg   -Buspar 15mg     Evening:  -Allopurinol 100mg   -Duloxetine (Cymbalta) 20mg   -Senna S  50mg -8.6  -Pregabalin (Lyrica) 100mg   -Buspar 15mg   -Trazodone 50mg     As needed:  -Hydromorphone (Dilaudid) 2mg  2 tablets (4mg ) by mouth every 4 hours as needed for pain  -Tizanidine 2mg  1 tablet by mouth every 8 hours as needed for pain  -Miralax 1 tablespoon in water by mouth 2 times a day as needed for constipation  -Loperamide (Imodium) 2mg  by mouth 4 times a day as needed for diarrhea  -Prochlorperazine 10mg  by mouth every 6 hours as needed for nausea  -Hydroxyzine (Atarax) 25mg  by mouth every 8 hours as needed for anxiety  -Diclofenac 1% gel (Voltaren) gel topically 4 times a day as needed for pain    ROS: Symptom Assessment      07/27/2023     5:52 PM 07/20/2023     3:38 PM 07/06/2023    10:14 AM 06/29/2023  9:53 AM 06/26/2023     8:00 AM 06/08/2023    12:01 PM 06/01/2023     1:47 PM   Palliative Care Symptom Assessment   Pain Level     5     Constipation     5     Tiredness     3     Nausea     0     Depression     9     Anxiety     9     Drowsiness     6     Appetite     4     Wellbeing     8     Dyspnea     0     Insomnia     3     ECOG 2 2 2 2  2 2        Additional ROS reviewed in detail and negative:  Review of Systems   Constitutional:  Positive for appetite change, fatigue and unexpected weight change. Negative for chills, diaphoresis and fever.   HENT:   Positive for trouble swallowing. Negative for sore throat and voice change.         +Nasal congestion  +Post-nasal drip   Eyes:  Negative for icterus.   Respiratory:  Negative for chest tightness, cough, hemoptysis, shortness of breath and wheezing.    Cardiovascular:  Negative for chest pain, leg swelling and palpitations.   Gastrointestinal:  Positive for constipation. Negative for abdominal pain, blood in stool, diarrhea, nausea and vomiting.   Genitourinary:  Positive for difficulty urinating. Negative for dysuria, frequency and hematuria.    Musculoskeletal:  Positive for arthralgias, back pain, gait problem and neck pain.   Skin:  Negative for rash.    Neurological:  Positive for extremity weakness and gait problem. Negative for light-headedness and speech difficulty.   Psychiatric/Behavioral:  Positive for sleep disturbance. Negative for suicidal ideas. The patient is nervous/anxious.         Outpatient Palliative Regimen:  Acetaminophen 1000mg  PO q8h PRN pain   Duloxetine CR 20mg  PO BID  Prochlorperazine 10mg  PO q6h PRN nausea  Trazodone 50mg  PO nightly   Pregabalin 100mg  PO BID   Miralax 17gm PO BID PRN constipation  Senna-S 1 tablet PO BID (not taking per patient)  Lidocaine 4% patch 12 hours on / 12 hours off  Diclofenac 1% topical QID PRN pain  Narcan PRN  Hydroxyzine 25mg  PO q8h PRN anxiety (not taking)  Ibuprofen 600mg  PRN  Tizanidine 2mg  PO q8h PRN muscle spasms    Conversion to Oral Morphine Equivalent = 30 to 40     Allergies & Reactions: Review of patient's allergies indicates Sulfa drugs    Medications: (reviewed today)  Current Outpatient Medications on File Prior to Visit   Medication Sig Dispense Refill    acetaminophen (TYLENOL) 500 MG tablet Take 2 tablets (1,000 mg) by mouth every 8 hours as needed for Mild Pain (Pain Score 1-3) or Moderate Pain (Pain Score 4-6). 40 tablet 0    albuterol (PROAIR HFA) 108 (90 Base) MCG/ACT inhaler ProAir HFA 90 mcg/actuation aerosol inhaler      allopurinol (ZYLOPRIM) 100 MG tablet Take 1 tablet (100 mg) by mouth daily. 30 tablet 0    Artificial Tear Solution (SOOTHE XP OP)       [DISCONTINUED] busPIRone (BUSPAR) 10 MG tablet TAKE 1 TAB TWICE DAILY 60 tablet 0    busPIRone (BUSPAR) 15 MG tablet Take 1 tablet (  15 mg) by mouth 2 times daily. 180 tablet 1    diclofenac (VOLTAREN) 1 % gel Apply 2 g topically every 6 hours as needed (Right knee pain). 100 g 0    DULoxetine (CYMBALTA) 20 MG CR capsule Take 1 capsule (20 mg) by mouth 2 times daily. 180 capsule 2    fluticasone propionate (FLONASE) 50 MCG/ACT nasal spray 2 sprays.      HYDROmorphone (DILAUDID) 2 MG tablet Take 1 tablet (2 mg) by mouth every 4 hours as  needed for Moderate Pain (Pain Score 4-6) or Severe Pain (Pain Score 7-10). 60 tablet 0    hydrOXYzine HCL (ATARAX) 25 MG tablet Take 1 tablet (25 mg) by mouth every 8 hours as needed for Anxiety. 30 tablet 2    ibuprofen (MOTRIN) 600 MG tablet       ketotifen (ALAWAY) 0.025 % ophthalmic solution Place 1 drop into both eyes 2 times daily.      loperamide (IMODIUM) 2 MG capsule For mild to moderate diarrhea (less than 5 BMs within 24 hours):  Take 1 capsule (2 mg) by mouth 4 times daily as needed for Diarrhea.  For severe Diarrhea (5 or more medium to large BMs within 24 hours):  take 2 capsules and then 1 capsule every 2 hours until diarrhea stops. 90 capsule 0    loratadine (CLARITIN REDITABS) 10 MG dissolvable tablet       LORazepam (ATIVAN) 0.5 MG tablet Take 1 tablet by mouth 30 to 60 minutes prior to MRI.  May repeat dose once if needed. 2 tablet 0    Multiple Vitamins-Minerals (MULTIVITAMIN WITH MINERALS) TABS tablet Take 1 tablet by mouth daily. 30 tablet 1    naloxone (NARCAN) 4 mg/0.1 mL nasal spray For suspected opioid overdose, call 911! Then spray once in one nostril. Repeat after 3 minutes if no or minimal response using a new spray in other nostril. 2 each 0    Nutritional Supplements (NEPRO) LIQD Take 3 Cans by mouth daily. 30000 mL 11    polyethylene glycol (MIRALAX) 17 g packet Take 1 packet (17 g) by mouth 2 times daily as needed for Constipation. 1 each 0    pregabalin (LYRICA) 100 MG capsule Take 1 capsule (100 mg) by mouth at bedtime. 30 capsule 0    prochlorperazine (COMPAZINE) 10 MG tablet Take 1 tablet (10 mg) by mouth every 6 hours as needed (Nausea/Vomiting). 30 tablet 5    senna (SENOKOT) 8.6 MG tablet Take 2 tablets (17.2 mg) by mouth 2 times daily. 30 tablet 0    tizanidine (ZANAFLEX) 2 MG tablet Take 1 tablet (2 mg) by mouth every 8 hours as needed (muscle spasm). 30 tablet 2    traZODone (DESYREL) 50 MG tablet Take one tab nightly by mouth at bedtime as needed for insomnia 90 tablet 0      No current facility-administered medications on file prior to visit.       Past Medical History:  Past Medical History:   Diagnosis Date    Chronic back pain     Congenital hydronephrosis     Gout     Headache     Hematuria     HTN (hypertension) 12/10/2021    Kidney disease     Kidney stones     Major depressive disorder, single episode     Polyarthropathy or polyarthritis of multiple sites     Retinal detachment     Urethral stricture  Past Surgical History:  Past Surgical History:   Procedure Laterality Date    CT INSERTION OF SUPRAPUBIC CATH  09/25/2015    NEPHRECTOMY Right 1955    APPENDECTOMY      COLONOSCOPY      CYSTOSCOPY      CYSTOSCOPY W/ LASER LITHOTRIPSY      OTHER SURGICAL HISTORY      Interstim 01/29/2011    SPINE SURGERY  09/21    Lumbar-sacral fusion    TRANSURETHRAL RESECTION OF PROSTATE         Social History:  Healthcare agent/surrogate decision-maker: Colman Cater (sister)  Social History     Social History Narrative    Not on file        Family History:  Family History   Adopted: Yes   Family history unknown: Yes       PEX:  Vitals:    08/14/23 1349   BP: 117/80   BP Location: Left arm   BP Patient Position: Sitting   BP cuff size: Regular   Pulse: 56   Resp: 16   Temp: 98 F (36.7 C)   TempSrc: Temporal   SpO2: 98%   Weight: 73.2 kg (161 lb 4.8 oz)   Height: 5' 6.14" (1.68 m)           General: No acute distress, thin, sitting in chair  HEENT: PERRLA, EOMI, no conjunctivitis or scleral icterus noted.   Nares patent, no sinus pain with palpation.  Neck:  Trachea midline.  Lungs: Clear to auscultation bilaterally, no wheezes, no rhonchi, no rales  CV: Regular rate and rhythm, no murmurs, no rubs, no gallops  Abdomen: Soft, nondistended, nontender to palpation, no rebound or guarding, normoactive bowel sounds in all four quadrants, no CVAT, +nephrostomy tube noted with clear urine in bag on L side  Neuro: Oriented x3, no dysarthria, moving all four extremities spontaneously, 5/5  strength throughout.  Continues to appear to have memory issues at times (unable to tell me how often and when he is taking certain medications once again today) although seems able to recall most things  Psych: Thought process linear and goal-directed, thought content appropriate, affect full range and appropriate, denies suicidal ideation, speech is pressured at times and he is reporting significant anxiety today.  No evidence of hallucinations or delusions  Skin: Warm and well perfused.  Brisk capillary refill.      Pertinent Labs:     Lab Results   Component Value Date    WBC 5.8 07/27/2023    RBC 3.69 (L) 07/27/2023    HGB 11.1 (L) 07/27/2023    HCT 32.6 (L) 07/27/2023    MCV 88.3 07/27/2023    MCHC 34.0 07/27/2023    RDW 15.5 (H) 07/27/2023    PLT 233 07/27/2023    MPV 9.6 07/27/2023       Lab Results   Component Value Date    BUN 29 (H) 07/27/2023    CREAT 1.39 (H) 07/27/2023    CL 98 07/27/2023    NA 138 07/27/2023    K 4.3 07/27/2023    [place] 10.1 07/27/2023    TBILI 0.78 07/27/2023    ALB 4.2 07/27/2023    TP 7.5 07/27/2023    AST 19 07/27/2023    ALK 120 07/27/2023    BICARB 28 07/27/2023    ALT 14 07/27/2023    GLU 125 (H) 07/27/2023       Above labs reviewed    Pertinent Diagnostic Study  Findings:  PET/CT 07/19/23  IMPRESSION:  1. Compared to prior PET-CT 04/26/23 interval metabolic/anatomic progression of previous thoracic, mediastinal and abdominal lymph nodes.  2. Interval metabolic progression of previous sclerotic lesions predominantly in the sternum, ribs, spine, pelvis and left femur.  3. New diffuse increased uptake in the stomach lining which may represent underlying gastritis and can be followed on subsequent exam.      Above findings reviewed    ASSESSMENT: Sricharan Treadaway is a 72 year old male with a history of metastatic urothelial carcinoma, OA, HTN, MDD, gout referred to Palliative Care clinic for symptom management and psychosocial support.    Medical chart reviewed in detail.  Above labs  reviewed and notable for anemia, elevated creatinine (Cr 1.39)   Above imaging reviewed personally and notable for left PCN in place; numerous sclerotic osseous lesions throughout the axial and proximal appendicular skeleton; mildly hypermetabolic mediastinal and hilar lymph nodes.  Recent CT A/P with new nondisplaced pathologic fracture at the left iliac wing.      Kreig was seen today for pain.    Diagnoses and all orders for this visit:    Metastatic urothelial carcinoma (CMS-HCC)    Pain from bone metastases (CMS-HCC)    Cancer related pain    Cervical radiculopathy    Cervical spondylosis without myelopathy    Therapeutic opioid induced constipation    Fatigue, unspecified type    Insomnia, unspecified type    At risk for nausea    Dysphagia, unspecified type    Anxiety    Memory problem    History of fall    Palliative care by specialist                PLAN:  - patient is at times unsure of what medications he is taking and when, will reach out to his sister to discuss further and ensure he has a safe medication plan. We prepared a new medication list for today with large font.   - increase hydromorphone to 2mg  2 tablets (4mg ) PO q4h PRN severe pain.  Patient has a history of opioid overdose, recommend avoiding long acting opioids at this time.  If pain not relieved with taking hydromorphone around the clock at this dose, may consider Fentanyl patch if patient's sister willing to help him manage it safely  - continue acetaminophen 1000mg  PO q8h PRN pain  - continue duloxetine CR 20mg  PO BID to target pain and anxiety, monitor carefully for signs/symptoms of serotonin syndrome  - continue pregabalin 100mg  PO BID, monitor renal function closely.    - continue tizanidine at dose of 2mg  PO q8h PRN muscle spasms.    - have discontinued hydroxyzine, holding this right now as patient is at high risk of falls and he also did not feel it was helpful  - continue Buspar 15mg  PO BID  - CURES reviewed  - Narcan PRN  prescribed at previous visit and we discussed how/when to use in emergencies.  Confirmed that patient has this at home.  - continue Senna-S 1 tablet PO BID (encouraged Stephen [name] to make sure to take this)  - continue Miralax 17gm PO BID PRN constipation  - continue prochlorperazine 10mg  PO q6h PRN nausea  - continue trazodone 50mg   PO nightly PRN insomnia.   - continue follow up with Pain Management  - Continue Nutrition follow up  - Continue follow up with PT, Speech Therapy  - Will reach out to Psychiatry to make sure Stephen [name] has a  now provider now that Dr. Reina Fuse has left [place]  - Referred to Memory Clinic to address chronic memory issues but noted there is no appointment today.    - Patient pending XRT with Radiation Oncology  - Patient adamantly declines IHSS or home based palliative care or physical therapy.  Declines wheelchair.  - please mychart message or call the palliative care clinic regarding symptom management    Follow up in palliative care clinic on 09/04/23 at 9 AM in person at [facility] (60 minutes)    Patient evaluated with the following [name] Service Members:  Georjean Mode, LVN  Martha Clan, MD  Rayburn Go, MS4    Thank you for your consult.  We will continue to follow this patient and provide longitudinal care, symptom management and psychosocial support.    Shea Anique Beckley, MD  Attending Physician  [facility] Palliative Care Service    Total Attending time 65 min:  65 min spent performing physical examination, counseling patient, and coordinating their care with medical team.

## 2023-08-14 NOTE — Progress Notes (Unsigned)
[name] Service Palliative Care Note     Reason for visit: Follow-up   Pain management   Psychosocial or family support     Diagnosis: MN of urinary bladder     Advanced Care Planning: On File  Surrogate Decision Maker:  Colman Cater, sister  [phone]    Assessment:     Patient Orientation: A&O x 4, answers questions readily and appropriately with recent and remote memories intact    CC:  - pain is not managed    General:   - he managed the nephrostomy drain and repost adequate urinary output.     Pain Assessment:    Location: general body pain "all over my body'  Quality: dull to sharp pain  Severity:      Current pain score 1-10:  9      Worst pain score 1-10: 10      Best daily pain score 1-10: did not provide   Duration/Timing: constant   Modifying factors       Aggravating factors: unknown cause, pain comes in even at rest        Alleviating factors: pain medications help a little, per patient     Pain Medication:   1) ibuprofen 600 mg - report he does not take it since   2) tylenol 500 mg (2 tabs) every 8 hours, not taking  3) cymbalta 20 mg capsule bid daily   - it does not help manage the pain   4) dilaudid 2 mg every 4 hours PRN  - report he takes it every 4 hours ATC.   - pain is not managed.    Nausea/vomiting:     - Denies N/V    Medication:   1) compazine 10 mg as ordered    Appetite:   He makes effort to eat.  He has small dinner every night and takes nephro protein drink bid/day     GI:  Bowel Movements:   - Consistency:  occasional constipation      - bm every other day and had bm today  - he does not take stool softener to regulate bm.     Medication:   1) senna as ordered   2) miralax as ordered    Anxiety/depression:   - he was told by his oncologist - treatment is not working"    Medication:   - buspar 10 mg bid     Sleep/Fatigue   - sleep pattern varies mostly awake every 4 hours to empty     Medication:   1) trazadone 50 mg at HS     Financial challenges: None     Well Being Assessment:    -Patient's needs currently being addressed.  Patient's affect/mood appropriate for situation.     Education: Chief Operating Officer and verbal instruction provided regarding plan of care.   Barriers to learning assessed: No barriers noted. Patient verbalized understanding of teaching and instructions.     Contact information, AVS given and reviewed related to plan of care. Questions answered to patient's satisfaction and verbalized understanding. Patient/family aware to call with any questions or concerns that may arise.    Georjean Mode, LVN

## 2023-08-15 ENCOUNTER — Encounter (HOSPITAL_BASED_OUTPATIENT_CLINIC_OR_DEPARTMENT_OTHER): Payer: Self-pay | Admitting: Hematology & Oncology

## 2023-08-15 ENCOUNTER — Telehealth (HOSPITAL_BASED_OUTPATIENT_CLINIC_OR_DEPARTMENT_OTHER): Payer: Self-pay | Admitting: Family Medicine

## 2023-08-15 ENCOUNTER — Telehealth (HOSPITAL_BASED_OUTPATIENT_CLINIC_OR_DEPARTMENT_OTHER): Payer: Self-pay | Admitting: Radiation Oncology

## 2023-08-15 DIAGNOSIS — C678 Malignant neoplasm of overlapping sites of bladder: Secondary | ICD-10-CM

## 2023-08-15 NOTE — Telephone Encounter (Signed)
[name] Service Progress Note    Spoke with patient's sister Elita Quick over the phone this afternoon.  Went over all of his medications and our plan for him to take hydromorphone 2mg  2 tablets (4mg ) PO q4h around the clock for a few days and potentially start Fentanyl patch if pain still not controlled.  She thinks Fentanyl patch would be a good idea and easy for her to keep track of for him, given that he's had some memory problems and needs help with his medications.  I let her know we suggested additional anxiolytics for Stephen [name] but he preferred to opt against additional medications for now.  I let her know we prepared a medication list for Stephen [name] yesterday and sent him home with this (she has not yet seen it and will talk to him about this).  She mentions that she manages all of his scheduled medications but that Stephen [name] typically takes hydromorphone on his own.  However, he has not seemed excessively sleepy or had any falls recently that she knows of.  I let her know to contact us if any questions or concerns arise and we will call Stephen [name] later this week to check in.    Shea Honest Vanleer, MD  Assistant Professor  [facility] Palliative Care Service  [email or web address]

## 2023-08-16 ENCOUNTER — Ambulatory Visit (HOSPITAL_BASED_OUTPATIENT_CLINIC_OR_DEPARTMENT_OTHER): Payer: Self-pay

## 2023-08-16 ENCOUNTER — Telehealth (HOSPITAL_BASED_OUTPATIENT_CLINIC_OR_DEPARTMENT_OTHER): Payer: Self-pay

## 2023-08-16 ENCOUNTER — Encounter (HOSPITAL_BASED_OUTPATIENT_CLINIC_OR_DEPARTMENT_OTHER): Payer: Self-pay | Admitting: Hematology & Oncology

## 2023-08-16 NOTE — Interdisciplinary (Addendum)
CRC Note     Sponsor / Study title: [employer]. / "A Phase 1, Open-Label Study of [id number] to Assess Safety, Tolerability, and Pharmacokinetics in Patients with Advanced Solid Tumors"     IRB: [phone]  Re: Initial Informed Consent CRC Narrative     I met with Mr. Stephen [name] in-person on 15-Aug-2023, to discuss the [employer] [id number] (IRB [phone]) Informed Consent Form. I thoroughly reviewed the informed consent form with the patient, including the purpose of the study, the voluntary nature of the study, and the extent of confidentiality. Dr. Brock Ra reviewed the risks and benefits of participation in this clinical trial and presented to the subject all other alternative types of therapies during a clinic visit on 10-Aug-2023.      The patient states he is no longer sexually active. Reproductive risks were reviewed with the patient and he was informed of adequate contraceptive measures if he were to consider sexual activity during his participation in this study.     In addition to the Informed Consent Form, we reviewed the Annette Stable of Rights and HIPAA forms. Per Dr. Marca Ancona evaluation, the patient has decision-making capabilities and the information was provided in a language understandable to him. He was given ample time to review the consent and ask any questions. All of his questions were answered to apparent satisfaction. Neither the investigator nor the research staff have coerced or influenced the patient to participate in the trial.      Stephen [name] agreed to participate in the dose escalation part of the study and signed the consent ([facility] IRB Approved Version 17 Mar 2023) and HIPAA form on 15-Aug-2023, prior to any study related procedures. As the person administering consent, I signed the informed consent form on 15-Aug-2023.     The original signed forms will remain in the clinical research chart. Copies of the signed consent, signed HIPAA, and bill of  rights were given to the patient and also sent to medical records to be uploaded in Epic (EMR). The patient was also provided with my contact information.      Ruben Gottron  Clinical Research Coordinator  [phone]

## 2023-08-16 NOTE — Telephone Encounter (Signed)
Attempted to reach patient to offer echo appointment. LVM provided office # to call back and reschedule

## 2023-08-17 ENCOUNTER — Other Ambulatory Visit (INDEPENDENT_AMBULATORY_CARE_PROVIDER_SITE_OTHER): Payer: Medicare Other

## 2023-08-17 ENCOUNTER — Telehealth (HOSPITAL_BASED_OUTPATIENT_CLINIC_OR_DEPARTMENT_OTHER): Payer: Self-pay

## 2023-08-17 ENCOUNTER — Ambulatory Visit (HOSPITAL_BASED_OUTPATIENT_CLINIC_OR_DEPARTMENT_OTHER): Payer: Medicare Other

## 2023-08-17 NOTE — Telephone Encounter (Addendum)
[facility] / Medical Oncology Social Work Telephone note:    Stephen [name] and I have been exchanging voicemails the past few days in follow up for psychosocial support.  Stephen [name] has many appts with providers here at [facility], including: [facility], Radiation Oncology, palliative care, pain service, Nutrition service, etc.  He is also undergoing imaging and testing for a clinical trial with Dr. Roseanne Reno. Stephen [name] is hopeful about this treatment option.    Dr. Logan Bores saw Stephen [name] on 8/26, noted ongoing anxiety, and determined that he had been lost to f/u with [facility].  He also needed to switch providers, as Dr. Reina Fuse (who last saw him on 7/22) has left the cancer center.  The plan was for RTC in 6 weeks. Dr. Reina Fuse also spoke with Stephen [name] about transfer to one of his colleagues.  I passed this info along to [facility], and Stephen [name] was scheduled for 10/30 with Horton Finer, NP, at 10:00 am.    Today, I called Stephen [name] to check-in, as well as let him know about that [facility] appt with Marchelle Folks.  We spoke for 15 minutes.   Stephen [name] uses MyChart consistently and is aware of the appt with Marchelle Folks.  He is also keeping track effectively of all the other testing (MRI, CT scan, cardiac echo) and appts (IR for neph tubes, Nutrition, Pain service, and Palliative care) in his record.  He arranges his own transportation through his insurance without complaint.    I asked him about his anxiety on the day of appt with Dr. Logan Bores, and he stated he had difficulty that day scheduling an appt and was "upset that day but then I'm better now".  He sounded calm and in good spirits during our call.  Stephen [name] thanked me for the call and the support,  We agreed to talk again in the near future.    Kingsley Plan, LCSW  [facility] Social Worker  [phone]

## 2023-08-19 ENCOUNTER — Ambulatory Visit
Admission: RE | Admit: 2023-08-19 | Discharge: 2023-08-19 | Disposition: A | Discharge status: Home / Self Care | Payer: Medicare Other | Attending: Student in an Organized Health Care Education/Training Program | Admitting: Student in an Organized Health Care Education/Training Program

## 2023-08-19 DIAGNOSIS — C7951 Secondary malignant neoplasm of bone: Secondary | ICD-10-CM | POA: Insufficient documentation

## 2023-08-19 MED ORDER — HEPARIN SODIUM LOCK FLUSH 100 UNIT/ML IJ SOLN CUSTOM
500.0000 [IU] | Freq: Once | INTRAVENOUS | Status: AC
Start: 2023-08-19 — End: 2023-08-19

## 2023-08-19 MED ORDER — GADOBUTROL 1 MMOL/ML IV SOLN (WRAPPED RECORD)
7.0000 mL | Freq: Once | INTRAVENOUS | Status: AC
Start: 2023-08-19 — End: 2023-08-19
  Administered 2023-08-19: 7 mL via INTRAVENOUS

## 2023-08-19 MED ORDER — HEPARIN SODIUM LOCK FLUSH 100 UNIT/ML IJ SOLN CUSTOM
INTRAVENOUS | Status: AC
Start: 2023-08-19 — End: 2023-08-19
  Administered 2023-08-19: 500 [IU]
  Filled 2023-08-19: qty 5

## 2023-08-20 ENCOUNTER — Ambulatory Visit (HOSPITAL_BASED_OUTPATIENT_CLINIC_OR_DEPARTMENT_OTHER): Payer: Medicare Other

## 2023-08-22 ENCOUNTER — Encounter (HOSPITAL_BASED_OUTPATIENT_CLINIC_OR_DEPARTMENT_OTHER): Payer: Self-pay | Admitting: Hematology & Oncology

## 2023-08-22 ENCOUNTER — Ambulatory Visit: Admit: 2023-08-22 | Discharge: 2023-08-22 | Disposition: A | Discharge status: Home / Self Care | Payer: Medicare Other

## 2023-08-22 DIAGNOSIS — C662 Malignant neoplasm of left ureter: Secondary | ICD-10-CM

## 2023-08-22 DIAGNOSIS — C678 Malignant neoplasm of overlapping sites of bladder: Secondary | ICD-10-CM

## 2023-08-22 DIAGNOSIS — Z006 Encounter for examination for normal comparison and control in clinical research program: Secondary | ICD-10-CM

## 2023-08-22 DIAGNOSIS — C679 Malignant neoplasm of bladder, unspecified: Secondary | ICD-10-CM

## 2023-08-22 DIAGNOSIS — C7910 Secondary malignant neoplasm of unspecified urinary organs: Secondary | ICD-10-CM

## 2023-08-22 DIAGNOSIS — C7989 Secondary malignant neoplasm of other specified sites: Secondary | ICD-10-CM

## 2023-08-22 DIAGNOSIS — K4090 Unilateral inguinal hernia, without obstruction or gangrene, not specified as recurrent: Secondary | ICD-10-CM

## 2023-08-22 DIAGNOSIS — C7951 Secondary malignant neoplasm of bone: Secondary | ICD-10-CM

## 2023-08-22 MED ORDER — HEPARIN SODIUM LOCK FLUSH 100 UNIT/ML IJ SOLN CUSTOM
INTRAVENOUS | Status: AC
Start: 2023-08-22 — End: 2023-08-22
  Filled 2023-08-22: qty 5

## 2023-08-22 MED ORDER — IOHEXOL 350 MG/ML IV SOLN
500.0000 mL | Freq: Once | INTRAVENOUS | Status: AC
Start: 2023-08-22 — End: 2023-08-22
  Administered 2023-08-22: 100 mL via INTRAVENOUS
  Filled 2023-08-22: qty 500

## 2023-08-22 MED ORDER — HEPARIN SODIUM LOCK FLUSH 100 UNIT/ML IJ SOLN CUSTOM
500.0000 [IU] | Freq: Once | INTRAVENOUS | Status: AC | PRN
Start: 2023-08-22 — End: 2023-08-22
  Administered 2023-08-22: 500 [IU]
  Filled 2023-08-22: qty 5

## 2023-08-23 ENCOUNTER — Other Ambulatory Visit (HOSPITAL_BASED_OUTPATIENT_CLINIC_OR_DEPARTMENT_OTHER): Payer: Self-pay | Admitting: Vascular & Interventional Radiology

## 2023-08-23 ENCOUNTER — Ambulatory Visit (HOSPITAL_BASED_OUTPATIENT_CLINIC_OR_DEPARTMENT_OTHER): Payer: Medicare Other

## 2023-08-23 ENCOUNTER — Encounter (HOSPITAL_BASED_OUTPATIENT_CLINIC_OR_DEPARTMENT_OTHER)
Admission: RE | Disposition: A | Discharge status: Home Health | Payer: Self-pay | Source: Ambulatory Visit | Attending: Vascular & Interventional Radiology

## 2023-08-23 ENCOUNTER — Ambulatory Visit
Admission: RE | Admit: 2023-08-23 | Discharge: 2023-08-23 | Disposition: A | Discharge status: Home / Self Care | Payer: Medicare Other | Source: Ambulatory Visit | Attending: Vascular & Interventional Radiology | Admitting: Vascular & Interventional Radiology

## 2023-08-23 ENCOUNTER — Telehealth (HOSPITAL_BASED_OUTPATIENT_CLINIC_OR_DEPARTMENT_OTHER): Payer: Self-pay

## 2023-08-23 ENCOUNTER — Encounter (HOSPITAL_BASED_OUTPATIENT_CLINIC_OR_DEPARTMENT_OTHER): Payer: Self-pay | Admitting: Vascular & Interventional Radiology

## 2023-08-23 DIAGNOSIS — Z436 Encounter for attention to other artificial openings of urinary tract: Secondary | ICD-10-CM

## 2023-08-23 DIAGNOSIS — N2889 Other specified disorders of kidney and ureter: Secondary | ICD-10-CM

## 2023-08-23 DIAGNOSIS — N1330 Unspecified hydronephrosis: Secondary | ICD-10-CM

## 2023-08-23 SURGERY — IR NEPHROSTOMY TUBE
Anesthesia: Local | Laterality: Left

## 2023-08-23 MED ORDER — LIDOCAINE HCL 1 % IJ SOLN
INTRAMUSCULAR | Status: AC
Start: 2023-08-23 — End: 2023-08-23
  Filled 2023-08-23: qty 10

## 2023-08-23 MED ORDER — LIDOCAINE HCL 1 % IJ SOLN
INTRAMUSCULAR | Status: DC | PRN
Start: 2023-08-23 — End: 2023-08-23
  Administered 2023-08-23: 3 mL via INTRADERMAL

## 2023-08-23 MED ORDER — IODIXANOL 320 MG/ML IV SOLN
INTRAVENOUS | Status: DC | PRN
Start: 2023-08-23 — End: 2023-08-23
  Administered 2023-08-23: 5 mL

## 2023-08-23 MED ORDER — IODIXANOL 320 MG/ML IV SOLN
INTRAVENOUS | Status: AC
Start: 2023-08-23 — End: 2023-08-23
  Filled 2023-08-23: qty 50

## 2023-08-23 SURGICAL SUPPLY — 21 items
APPLICATOR CHLORAPREP 26ML, ~~LOC~~ (Prep Solutions) IMPLANT
BAG DRAINAGE NEPHROSTOMY 600ML (Misc Medical Supply) ×1 IMPLANT
CANNULA NASAL OXYGEN (Misc Medical Supply) IMPLANT
CATHETER DRAIN MULTIPURPOSE MAC-LOC 10.2FR X 25CM (Lines/Drains) ×1 IMPLANT
COVER PROBE ISOSILK 6" X 96" INTRA OPERATIVE (Drapes/towels) IMPLANT
DECANTER BAG-A-JET STERILE (Misc Medical Supply) ×1 IMPLANT
DRESSING SPONGE 2X2X4 PLY (Dressings/packing) ×1 IMPLANT
DRESSING SPONGE IV 2X2 STRL (Dressings/packing) ×11 IMPLANT
DRESSING TEGADERM HP 4X4 (Dressings/packing) IMPLANT
DRESSING TEGADERM I.V. ADVANCED 4X4 1688 (Dressings/packing) ×11 IMPLANT
GLOVE BIOGEL PI ULTRATOUCH SIZE 6 (Gloves/Gowns) ×2 IMPLANT
GLOVE BIOGEL PI ULTRATOUCH SIZE 7 (Gloves/Gowns) ×1 IMPLANT
GUIDEWIRE AMPLATZ SUPER STIFF 0.035" X 75CM, 7CM TAPER, STRAIGHT (Procedural wires/sheaths/catheters/balloons/dilators) ×1 IMPLANT
GUIDEWIRE STARTER J-CURVED FIXED CORE 0.035" X 150CM, 8CM TAPER, 15MM J TIP (Procedural wires/sheaths/catheters/balloons/dilators) IMPLANT
INTRODUCER ACCUSTICK II (Needles/punch/cannula/biopsy) IMPLANT
INTRODUCER ACCUSTICK II 0.038" J TIP WIRE & 0.018" NITINOL WIRE (Procedural wires/sheaths/catheters/balloons/dilators) IMPLANT
INTRODUCER SHEATH GREBSET 5FR X 30 CM W/70CM GUIDEWIRE (Procedural wires/sheaths/catheters/balloons/dilators) IMPLANT
PREP CHLOROPREP 10.5ML 1-STEP (Prep Solutions) IMPLANT
PROCEDURE PACK - IR NON-VASCULAR (Procedure Packs/kits) ×1 IMPLANT
SUTURE ETHILON 2-0 18" FS (Suture) ×1 IMPLANT
TOWELS OR BLUE 4-PACK STERILE, DISPOSABLE (Drapes/towels) IMPLANT

## 2023-08-23 NOTE — Discharge Instructions (Signed)
 General Discharge Instructions     SEDATION     If you were given sedative or pain medications used during the procedure you are considered legally impaired for 24 hours. Medications used for sedation can remain in your system for up to 24 hours. Please do not drive, sign any legal documents or drink alcohol during this time.     You may experience temporary alterations in memory or amnesia. If this has not resolved after 24 hours please contact us.     PAIN MANAGEMENT     You may use over-the-counter acetaminophen (TYLENOL) for minor discomfort, if you are not otherwise restricted from using this medication or any other prescribed pain medication by the doctor that performed your procedure.     WHEN TO CALL YOUR PHYSICIAN      Fever (temperature > 100 F) and / or chills    Severe pain / chest pain    Bleeding, redness / drainage or new swelling around the incision site    Shortness of breath    Nausea or vomiting     Tubes/drains/lines:     If you have a drain or tube in place, no swimming, baths, or soaking in hot tubs.     MEDICATIONS     Please resume taking your regular medications, unless otherwise told by your doctor.     PHYSICIAN PERFORMING YOUR PROCEDURE: Dr. Levonne Spiller        CONTACT us:     During regular business hours, M-F 8:00 to 4:30, please call the IR Clinic at [phone].    After Hours: Please call the [place] [facility] Page Operator at [phone] and ask for the Interventional Radiologist On-Call.    Please call the [facility] Page Operator at [phone] and ask for the Neurosurgery doctor On-Call (for Neuro patients)    For scheduling concerns please call our Interventional Radiology Schedulers M-F 8:00 to 4:30 at [phone].  Nephrostomy Tube Placement / Nephroureteral Tube Placement / Retrograde Nephroureteral Tube     We are pleased that you have chosen [facility] for your care today.  The procedure you had was a kidney tube placement.  A small flexible tube was placed into your kidney  to drain your urine.       Pain Management   You received local anesthesia during your nephrostomy tube placement. As the anesthesia wears off, you may feel some pain and discomfort from your procedure. The site where your nephrostomy tube was placed may be sore.   You may use over the counter acetaminophen (Tylenol) for minor discomfort, if you are not otherwise restricted from using this medication or any other prescribed pain medication by your doctor that performed the procedure.      Activity   You may start taking showers again 24 hours after your procedure. Shower as usual, but do not scrub or soak your tube site. Blot dry gently with a soft cloth.       Redress the site after your first shower. Once the initial dressing is changed, no dressing change is required for 7 days unless the dressing appears visibly soiled.       Avoid heavy lifting (more than 10 pounds) and strenuous physical activity for 7 days after your procedure.      Do not soak in tubs, hot tubs, or go swimming for 7-10 days or until your site heals.      Sedation / Anesthesia   You have been treated with a sedative, pain medication, or  anesthetic that might make you sleepy, and have side effects that may not completely wear off for up to 24 hours. These side effects include:   Sleepiness   Confused thinking   Dizziness, loss of coordination   Nausea   Temporary alterations in memory or amnesia     If any of these side effects have not worn off after 24 hours, please contact your doctor's office or go to the nearest emergency room. For your safety, you must have a responsible adult help you go home.      Because of the side effects of your anesthesia or medications, we strongly recommend that you:   Do not drive a car or operate machinery for 24 hours   Do not sign any legal documents, make any important personal or legal decisions for 24 hours   Take only the medications prescribed by your doctor   Do not drink alcohol for at least 24 hours       Tube care: Kidney tubes need to be exchanged every 3 months to prevent them from clogging.       Diet: You can eat your normal diet. If your stomach is upset, try bland, low-fat foods like plain rice, broiled chicken, toast, and yogurt. Drink plenty of fluids (unless your doctor tells you not to).      When to call / Contact information        Normal:     Bruising and small lump around site  Mild discomfort  Pink or red-tinged output in bag for the first 24-48 hours that clears up more each day     Urgent:     Call your clinic or contact the hospital operator at [phone]       Fever (temperature > 100F) and/or chills  Severe pain / chest pain  Bleeding, redness / drainage or new swelling around the incision site  Shortness of breath  Nausea or vomiting  Bleeding that soaks through the bandage or doesn't stop with direct pressure     Emergency:     Call 911 Uncontrolled bleeding  Difficulty breathing       Contact Information:    Monday to Friday from 8:00 am to 4:30 pm:  Please call the IR clinic at: [phone].    Nights and weekends:   Call the hospital operator at [phone] and ask for the Interventional Radiologist on call.    Non-urgent questions:  Please send a message on My Chart.

## 2023-08-23 NOTE — Telephone Encounter (Signed)
Called patient to confirm the following appointment.    Procedure:Neph Tube Check  Date:11/07  Check in Time:0900  Procedure Start time:1000  Location:[facility]  Prep:No  Driver: No

## 2023-08-24 ENCOUNTER — Other Ambulatory Visit: Payer: Self-pay | Admitting: *Deleted

## 2023-08-24 ENCOUNTER — Encounter (HOSPITAL_BASED_OUTPATIENT_CLINIC_OR_DEPARTMENT_OTHER): Payer: Self-pay | Admitting: Family Medicine

## 2023-08-24 ENCOUNTER — Ambulatory Visit (INDEPENDENT_AMBULATORY_CARE_PROVIDER_SITE_OTHER): Payer: Self-pay | Admitting: Internal Medicine

## 2023-08-24 ENCOUNTER — Ambulatory Visit: Payer: Medicare Other | Admitting: Hematology & Oncology

## 2023-08-24 ENCOUNTER — Ambulatory Visit
Admission: RE | Admit: 2023-08-24 | Discharge: 2023-08-24 | Disposition: A | Discharge status: Home / Self Care | Payer: Medicare Other | Attending: Hematology & Oncology | Admitting: Hematology & Oncology

## 2023-08-24 ENCOUNTER — Encounter (HOSPITAL_BASED_OUTPATIENT_CLINIC_OR_DEPARTMENT_OTHER): Payer: Self-pay | Admitting: Hematology & Oncology

## 2023-08-24 ENCOUNTER — Ambulatory Visit (HOSPITAL_BASED_OUTPATIENT_CLINIC_OR_DEPARTMENT_OTHER): Payer: Medicare Other

## 2023-08-24 ENCOUNTER — Other Ambulatory Visit (HOSPITAL_BASED_OUTPATIENT_CLINIC_OR_DEPARTMENT_OTHER): Payer: Self-pay | Admitting: Psychiatry

## 2023-08-24 VITALS — BP 130/74 | HR 59 | Temp 97.0°F | Resp 18 | Ht 66.14 in | Wt 162.4 lb

## 2023-08-24 VITALS — BP 138/67 | HR 63 | Temp 98.3°F | Resp 18 | Ht 66.14 in | Wt 162.4 lb

## 2023-08-24 DIAGNOSIS — I714 Abdominal aortic aneurysm, without rupture, unspecified: Secondary | ICD-10-CM

## 2023-08-24 DIAGNOSIS — C7910 Secondary malignant neoplasm of unspecified urinary organs: Secondary | ICD-10-CM

## 2023-08-24 DIAGNOSIS — H02883 Meibomian gland dysfunction of right eye, unspecified eyelid: Secondary | ICD-10-CM

## 2023-08-24 DIAGNOSIS — Z95828 Presence of other vascular implants and grafts: Secondary | ICD-10-CM | POA: Insufficient documentation

## 2023-08-24 DIAGNOSIS — H43811 Vitreous degeneration, right eye: Secondary | ICD-10-CM

## 2023-08-24 DIAGNOSIS — Z006 Encounter for examination for normal comparison and control in clinical research program: Secondary | ICD-10-CM

## 2023-08-24 DIAGNOSIS — C7951 Secondary malignant neoplasm of bone: Secondary | ICD-10-CM

## 2023-08-24 DIAGNOSIS — Z5189 Encounter for other specified aftercare: Secondary | ICD-10-CM

## 2023-08-24 DIAGNOSIS — G893 Neoplasm related pain (acute) (chronic): Secondary | ICD-10-CM

## 2023-08-24 DIAGNOSIS — H2513 Age-related nuclear cataract, bilateral: Secondary | ICD-10-CM

## 2023-08-24 DIAGNOSIS — H04123 Dry eye syndrome of bilateral lacrimal glands: Secondary | ICD-10-CM

## 2023-08-24 DIAGNOSIS — C662 Malignant neoplasm of left ureter: Secondary | ICD-10-CM | POA: Insufficient documentation

## 2023-08-24 DIAGNOSIS — H11003 Unspecified pterygium of eye, bilateral: Secondary | ICD-10-CM

## 2023-08-24 DIAGNOSIS — F411 Generalized anxiety disorder: Secondary | ICD-10-CM

## 2023-08-24 DIAGNOSIS — H02886 Meibomian gland dysfunction of left eye, unspecified eyelid: Secondary | ICD-10-CM

## 2023-08-24 LAB — CBC WITH DIFF, BLOOD
ANC-Automated: 4 10*3/uL (ref 1.6–7.0)
Abs Basophils: 0 10*3/uL (ref <0.2)
Abs Eosinophils: 0.2 10*3/uL (ref 0.0–0.5)
Abs Lymphs: 1.6 10*3/uL (ref 0.8–3.1)
Abs Monos: 0.5 10*3/uL (ref 0.2–0.8)
Basophils: 0.6 %
Eosinophils: 2.5 %
Hct: 32.4 % — ABNORMAL LOW (ref 40.0–50.0)
Hgb: 10.7 gm/dL — ABNORMAL LOW (ref 13.7–17.5)
Imm Gran %: 0.3 % (ref <1)
Imm Gran Abs: 0 10*3/uL (ref <0.1)
Lymphocytes: 24.6 %
MCH: 29.9 pg (ref 26.0–32.0)
MCHC: 33 g/dL (ref 32.0–36.0)
MCV: 90.5 um3 (ref 79.0–95.0)
MPV: 10.4 fL (ref 9.4–12.4)
Monocytes: 7.8 %
Plt Count: 209 10*3/uL (ref 140–370)
RBC: 3.58 10*6/uL — ABNORMAL LOW (ref 4.60–6.10)
RDW: 15.5 % — ABNORMAL HIGH (ref 12.0–14.0)
Segs: 64.2 %
WBC: 6.3 10*3/uL (ref 4.0–10.0)

## 2023-08-24 LAB — COMPREHENSIVE METABOLIC PANEL, BLOOD
ALT (SGPT): 15 U/L (ref 0–41)
AST (SGOT): 20 U/L (ref 0–40)
Albumin: 4.1 g/dL (ref 3.5–5.2)
Alkaline Phos: 140 U/L — ABNORMAL HIGH (ref 40–129)
Anion Gap: 15 mmol/L (ref 7–15)
BUN: 33 mg/dL — ABNORMAL HIGH (ref 8–23)
Bicarbonate: 27 mmol/L (ref 22–29)
Bilirubin, Tot: 0.53 mg/dL (ref <1.2)
Calcium: 9.8 mg/dL (ref 8.5–10.6)
Chloride: 99 mmol/L (ref 98–107)
Creatinine: 1.56 mg/dL — ABNORMAL HIGH (ref 0.67–1.17)
Glucose: 210 mg/dL — ABNORMAL HIGH (ref 70–99)
Potassium: 3.8 mmol/L (ref 3.5–5.1)
Sodium: 141 mmol/L (ref 136–145)
Total Protein: 7 g/dL (ref 6.0–8.0)
eGFR Based on CKD-EPI 2021 Equation: 48 mL/min/{1.73_m2}

## 2023-08-24 LAB — THROMBIN TIME, BLOOD: Thrombin Time: 15.2 s (ref 12.8–16.2)

## 2023-08-24 LAB — URINALYSIS WITH CULTURE REFLEX, WHEN INDICATED
Bilirubin: NEGATIVE
Blood: NEGATIVE
Glucose: NEGATIVE
Ketones: NEGATIVE
Leuk Esterase: 250 Leu/uL — AB
Nitrite: NEGATIVE
Specific Gravity: 1.018 (ref 1.002–1.030)
Urobilinogen: NEGATIVE
pH: 7 (ref 5.0–8.0)

## 2023-08-24 LAB — FREE THYROXINE, BLOOD: Free T4: 1.31 ng/dL (ref 0.93–1.70)

## 2023-08-24 LAB — AMYLASE, BLOOD: Amylase: 80 U/L (ref 28–100)

## 2023-08-24 LAB — PROTHROMBIN TIME, BLOOD
INR: 1
PT,Patient: 11.3 s (ref 9.7–12.5)

## 2023-08-24 LAB — PHOSPHORUS, BLOOD: Phosphorous: 3.5 mg/dL (ref 2.7–4.5)

## 2023-08-24 LAB — HEPATITIS B SURFACE AG, BLOOD: HBsAg: NONREACTIVE

## 2023-08-24 LAB — FIBRINOGEN, BLOOD: Fibrinogen: 366 mg/dL (ref 200–450)

## 2023-08-24 LAB — HEPATITIS B CORE AB TOTAL: HBcAb Total: NONREACTIVE

## 2023-08-24 LAB — MAGNESIUM, BLOOD: Magnesium: 2 mg/dL (ref 1.6–2.4)

## 2023-08-24 LAB — TSH, BLOOD: TSH: 0.75 u[IU]/mL (ref 0.27–4.20)

## 2023-08-24 LAB — HIV 1/2 ANTIBODY & P24 ANTIGEN ASSAY, BLOOD: HIV 1/2 Antibody & P24 Antigen Assay: NONREACTIVE

## 2023-08-24 LAB — BILIRUBIN, DIR BLOOD: Bilirubin, Dir: <0.2 mg/dL (ref <=0.2)

## 2023-08-24 LAB — HCV ANTIBODY WITH REFLEX QUANT: Hepatitis C Ab: NONREACTIVE

## 2023-08-24 LAB — LIPASE, BLOOD: Lipase: 34 U/L (ref 13–60)

## 2023-08-24 LAB — TRIGLYCERIDES, BLOOD: Triglycerides: 148 mg/dL (ref 10–170)

## 2023-08-24 LAB — CHOLESTEROL, TOTAL BLOOD: Cholesterol: 132 mg/dL (ref <200)

## 2023-08-24 LAB — APTT, BLOOD: PTT: 35 s (ref 27–36)

## 2023-08-24 MED ORDER — LIDOCAINE HCL (PF) 1 % IJ SOLN
0.3000 mL | INTRAMUSCULAR | Status: DC | PRN
Start: 2023-08-24 — End: 2023-08-24
  Administered 2023-08-24: 0.3 mL via INTRADERMAL

## 2023-08-24 MED ORDER — HEPARIN SODIUM LOCK FLUSH 100 UNIT/ML IJ SOLN CUSTOM
500.0000 [IU] | INTRAVENOUS | Status: DC | PRN
Start: 2023-08-24 — End: 2023-08-24
  Administered 2023-08-24: 500 [IU] via INTRAVENOUS

## 2023-08-24 MED ORDER — SODIUM CHLORIDE 0.9 % IJ SOLN (CUSTOM)
20.0000 mL | INTRAMUSCULAR | Status: DC | PRN
Start: 2023-08-24 — End: 2023-08-24
  Administered 2023-08-24: 20 mL via INTRAVENOUS

## 2023-08-24 NOTE — Interdisciplinary (Addendum)
Patient arrived in stable condition.   ECG completed x3 ( appart), reviewed by APP and placed in red folder.   Right chest port prepped and accessed per sterile protocol with positive blood return.   Labs: 1315  20G 3/4 inch Huber needle. Port flushed with 20ml NS and 500 units Heparin.  Needle deaccessed with needle intact.   Site benign. Bandaid placed.   Patient tolerated well and discharged ambulatory in stable condition.     Medications   lidocaine (XYLOCAINE) 1% PF injection 0.3 mL (has no administration in time range)   sodium chloride 0.9 % flush 20 mL (has no administration in time range)   heparin (HEP-LOCK) flush injection 500 Units (has no administration in time range)     Paul Dykes, LVN  08/24/23

## 2023-08-24 NOTE — Progress Notes (Signed)
Name: Stephen [name]   Date of Birth: Jul 07, 1951  Medical Record Number: [id number]    [facility]     Patient ID: Metastatic urothelial carcinoma    Stage: Cancer Staging   Ureter malignant neoplasm, left (CMS-HCC)  Staging form: Renal Pelvis And Ureter, AJCC 8th Edition  - Clinical: Stage IV (cTX, cN0, pM1) - Signed by Cheri Guppy, MD on 12/01/2022    Oncologic History:    04/22/21  CTU: filling defect in L renal pelvis  05/05/21  Cysto with L ureteroscopy revealed a L papillary tumor, biopsied: mixed high (30%) and low grade urothelial carcinoma  06/16/21  Repeat cysto, papillary tumor: pathology low grade papillary [place]  09/xx/22  Completed induction with mitogel (6 cycles)  08/19/21  HGTa of the bladder, no muscle  08/26/21  Repeat TURBT: NED, muscle identified  10/26/21  MRU: mild thickening in the L renal pelvis  12/24/21  Cysto/L ureteroscopy: dysplasia in the bladder; no disease in L ureter  05/06/22  L PCN placed  06/01/22  TURBT and L ureteroscopy: NED, stricture in place, stent placed  09/29/22  TURBT and L ureteroscopy: NED  10/22/22  MRU: Mild diffuse thickening in the L ureter/renal pelvis. Multiple osseous lesions involving the bilateral iliac crest and right acetabulum concerning for osseous metastatic disease.   11/26/22  PET/CT: diffuse osseous FDG activity with discrete osseous lesions concerning for metastatic disease.  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  Also treated for septic shock, AKI (Cr 2.5) and acute liver injury (AST/ALT 2800/2100).   S/p exchange L PCN.   02/07/23  start XRT to R Shoulder  02/10/23  C2D1 EV (dose reduced to 1.0 mg/kg) + pembro 200 mg  IV  03/03/23  C3D1 EV/pembro, missed D8  03/06/23  Cysto: NED.  Cytology NED.   03/14/23  Seen at [facility] ED for dislodged tube, replaced  04/02-05/24  Admitted with weakness, poor PO intake.   04/06/23  C4D1 EV/pembro  04/13/23  C4D8 EV  04/26/23  PET/CT: osseous lesions much improved, many new sclerotic lesions c/w treatment change.  Nonspecific mediastinal and pelvic LN  05/09-11/24  Admitted for treatment of AKI and rhabdomyolysis  05/11/23  C5D1 EV/P DR to 0.75  06/01/23  C6D1 EV/Pembro 0.75  06/23/23  MRI C-Spine: disease in spine, but no fracture or epidural involvement.  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  Still having pain in R shoulder, but less.  More pain in the spine.   He's eating.  Using protein shakes  He still has occ tingling in his hands/feet  Usinga rolling walker to walk. He has not had any more falls.     Review of Systems:   A complete ROS was performed and is negative except as documented above.     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  Non smoker  No etoh    Medications:  Current Outpatient Medications on File Prior to Visit   Medication Sig Dispense Refill    acetaminophen (TYLENOL) 500 MG tablet Take 2 tablets (1,000 mg) by mouth every 8 hours as needed for Mild Pain (Pain Score 1-3) or Moderate Pain (Pain Score 4-6). 40 tablet 0    albuterol (PROAIR HFA) 108 (90 Base) MCG/ACT inhaler ProAir HFA 90 mcg/actuation aerosol inhaler      allopurinol (ZYLOPRIM) 100 MG tablet Take  1 tablet (100 mg) by mouth daily. 30 tablet 0    Artificial Tear Solution (SOOTHE XP OP)       busPIRone (BUSPAR) 15 MG tablet Take 1 tablet (15 mg) by mouth 2 times daily. 180 tablet 1    diclofenac (VOLTAREN) 1 % gel Apply 2 g topically every 6 hours as needed (Right knee pain). 100 g 0    DULoxetine (CYMBALTA) 20 MG CR capsule Take 1 capsule (20 mg) by mouth 2 times daily. 180 capsule 2    fluticasone propionate (FLONASE) 50 MCG/ACT nasal spray 2 sprays.      HYDROmorphone (DILAUDID) 2 MG tablet Take 1 tablet (2 mg) by mouth every 4 hours as needed for Moderate Pain (Pain Score 4-6) or Severe Pain (Pain Score 7-10). 60 tablet 0    hydrOXYzine HCL (ATARAX) 25 MG tablet Take 1 tablet (25 mg) by mouth every 8 hours as needed for Anxiety. 30 tablet 2    ibuprofen (MOTRIN) 600 MG tablet       ketotifen (ALAWAY) 0.025 % ophthalmic solution Place 1 drop into both eyes 2 times daily.      loperamide (IMODIUM) 2 MG capsule For mild to moderate diarrhea (less than 5 BMs within 24 hours):  Take 1 capsule (2 mg) by mouth 4 times daily as needed for Diarrhea.  For severe Diarrhea (5 or more medium to large BMs within 24 hours):  take 2 capsules and then 1 capsule every 2 hours until diarrhea stops. 90 capsule 0    loratadine (CLARITIN REDITABS) 10 MG dissolvable tablet       LORazepam (ATIVAN) 0.5 MG tablet Take 1 tablet by mouth 30 to 60 minutes prior to MRI.  May repeat dose once if needed. 2 tablet 0    Multiple Vitamins-Minerals (MULTIVITAMIN WITH MINERALS) TABS tablet Take 1 tablet by mouth daily. 30 tablet 1    naloxone (NARCAN) 4 mg/0.1 mL nasal spray For suspected opioid overdose, call 911! Then spray once in one nostril. Repeat after 3 minutes if no or minimal response using a new spray in other nostril. 2 each 0    Nutritional Supplements (NEPRO) LIQD Take 3 Cans by mouth daily. 30000 mL 11    polyethylene glycol (MIRALAX) 17 g packet Take 1 packet (17 g) by mouth 2 times daily as needed for Constipation. 1 each 0     pregabalin (LYRICA) 100 MG capsule Take 1 capsule (100 mg) by mouth at bedtime. 30 capsule 0    prochlorperazine (COMPAZINE) 10 MG tablet Take 1 tablet (10 mg) by mouth every 6 hours as needed (Nausea/Vomiting). 30 tablet 5    senna (SENOKOT) 8.6 MG tablet  Take 2 tablets (17.2 mg) by mouth 2 times daily. 30 tablet 0    tizanidine (ZANAFLEX) 2 MG tablet Take 1 tablet (2 mg) by mouth every 8 hours as needed (muscle spasm). 30 tablet 2    traZODone (DESYREL) 50 MG tablet Take one tab nightly by mouth at bedtime as needed for insomnia 90 tablet 0     No current facility-administered medications on file prior to visit.       Physical Examination:   08/24/23  1107 08/24/23  1108   BP: 147/89 130/74   Pulse: 59    Temp: 97 F (36.1 C)    Resp: 18    SpO2: 100%      ECOG PS 1  General: NAD  HEENT: Moist mucous membranes.   Neck: No thyromegaly or lymphadenopathy.   Chest: Clear to ausculation bilaterally. No wheezes, rales or rhonchi.    Cardiac: Regular rate and rhythm without any murmurs, rubs or gallops.   Abdomen: Abd soft, non-tender. +BS.   Extremities: No clubbing, cyanosis. No edema  Neurological: Awake, alert and oriented x 3. Strength and sensation are grossly intact.   Psychologic: Appropriate mood and affect.  GU: L PCN site clean and dry, draining clear, yellow urine.     Labs: Following labs reviewed  Lab Results   Component Value Date    WBC 5.8 07/27/2023    RBC 3.69 (L) 07/27/2023    HGB 11.1 (L) 07/27/2023    HCT 32.6 (L) 07/27/2023    MCV 88.3 07/27/2023    MCHC 34.0 07/27/2023    RDW 15.5 (H) 07/27/2023    PLT 233 07/27/2023    MPV 9.6 07/27/2023      Lab Results   Component Value Date    BUN 29 (H) 07/27/2023    CREAT 1.39 (H) 07/27/2023    CL 98 07/27/2023    NA 138 07/27/2023    K 4.3 07/27/2023    [place]rum 10.1 07/27/2023    TBILI 0.78 07/27/2023    ALB 4.2 07/27/2023    TP 7.5 07/27/2023    AST 19 07/27/2023    ALK 120 07/27/2023    BICARB 28 07/27/2023    ALT 14 07/27/2023    GLU 125 (H) 07/27/2023                 Imaging:    PET CT 5/08:  1. Left percutaneous nephrostomy in place. There is interval increase in now moderate left hydronephrosis.  2. Multiple small abdominopelvic lymph nodes demonstrate metabolic activity at or slightly above blood pool and remain nonspecific. In particular, a periportal lymph node demonstrates slight interval increase in size and hypermetabolic activity. Continued attention on follow-up is recommended.  3. Interval development of numerous predominantly sclerotic osseous lesions throughout the axial and proximal appendicular skeleton. When compared with prior PET-CT 11/26/2022 many of the previous hypermetabolic lesions demonstrate interval decrease in metabolic activity and increased sclerosis, most compatible with positive response to therapy. Diffuse heterogeneous hypermetabolic activity remains throughout the axial and appendicular skeleton. This may be related to a background of reactive or treatment related process. Ongoing hypermetabolic metastasis is not excluded and continued attention on follow-up is recommended.  4. Interval development of multiple mildly hypermetabolic mediastinal and hilar lymph nodes. Findings are nonspecific and could represent a superimposed infectious or inflammatory process. However, new site of metastatic disease is not excluded and close attention on follow-up is recommended.  5. Persistent focal hypermetabolic activity associated with soft tissue prominence  along the left lateral aspect of the distal sigmoid colon. Findings are in the setting of large colonic stool burden and could represent stercoral colitis. Correlate with symptoms.      Assessment:  Stephen [name] is a 72 year old male with OA, HTN, MDD with L UTUC with metastatic disease ([id number]), with osseous mets (Pet/CT with diffuse enhancement and discrete lesions). FGFR-TACC mutation.  Started on EV+Pembro.      Scans after 4 cycles with improvement of osseous lesions, but  demonstrate substantial sclerotic osseous lesions, suggesting treated lesions not scene prior.  Unfortunately developed POD after 8 cycles.     I personally reviewed labs as above.  I personally reviewed scans as above.    Here currnently undergoing screening for [id number] study.  Scans with continued progression of disease.    PE as above.     Advanced Urothelial Carcinoma, [id number]  - continue screening for ABSK trial  - hold on XRT to R shoulder as we screen for clinical trial, can do this after he goes on study.   - consider adding zometa given significnt bone disease; needs dental clearance - he's known to have significant dental issues so this needs to be cleared first    Cancer related pain  - follows with palliative care, on multiple medications for pain including dilaudid  - caution given multiple events of AMS    R shoulder pain  - 2/2 metastatic lesion  - seen by ortho 1/22  - XRT as above    Anxiety  - f/u with palliative care  - monitor closely    RLE edema  - improved    L PCN, initially placed 04/2022  - draining well, will monitor    This visit was spent addressing a complex, chronic illness that poses a threat to life or bodily function to this patient.  I independently reviewed the patient's history since prior visit, reviewed intervening labs and imaging, counseled the patient on the management of their chronic illness, and placed orders to be done prior to next visit.     Asa Saunas. Roseanne Reno, M.D.  Assistant Professor of Medicine   [facility] [place]  [facility]  [address]  [place], [place] [address]       Sub-I Narrative      Sponsor / Study title: [employer] / "A Phase 1, Open-Label Study of [id number] to Assess Safety, Tolerability, and Pharmacokinetics in Patients with Advanced Solid Tumors"      IRB: [phone]      I reviewed Mr. Aydyn Lizza current clinical, laboratory and imaging data and the patient is a potential  candidate for IRB [phone].     I discussed standard of care, and clinical trial options as appropriate with the patient on 10-Aug-2023. I reviewed with the patient the currently available clinical trials at our institution relevant to the patient's medical condition in relation to the following: Main objectives of the trial, available therapeutic agent, research phase of the trial, known side effects, contraception usage as applicable and explained that there may be unforeseeable risks.     Additionally, it was discussed that while I have initially reviewed the patient's medical record to determine the patient is potentially eligible for the trial, further evaluation during the screening period may reveal new information excluding the patient from participation.     We discussed that the routine care costs procedures are billed to their medical insurance while the  research specific services are covered by the study sponsor.     The patient was advised to take the necessary time to make an informed decision about participating in the trial. I provided the patient with the opportunity to ask any questions in reference to the study which I answered to the patient's satisfaction. I emphasized that if the patient is ineligible for or decides not to participate in the study, I would continue to be their treating physician.     The research coordinator met with the patient to further discuss the informed consent and study details. A consent in the patient's preferable literature language was provided. The patient was encouraged to contact the research staff or myself with any questions or concerns related to the study.

## 2023-08-24 NOTE — Patient Instructions (Signed)
Patient Instructions:    --Labs today at [facility]    --Return per [facility]      Have questions about Advanced Care Planning/Advance Directive?  PREPARE ([email or web address])           Future Appointments   Date Time Provider Department Center   08/24/2023 12:30 PM FAST TRACK MUC FAST MUC   08/25/2023  7:00 PM [facility] ECHO ROOM 1 [facility] IMAGING S[facility]   09/01/2023  1:30 PM Riley Lam, RD [facility]   09/04/2023  9:00 AM Shea Evans, MD M[facility] Onc MUC   09/18/2023 11:00 AM Zlomislic, Alger Memos, MD [facility]   09/19/2023  4:00 AM RAD ONC BILLING SERIES [facility] RAD ONC MUC   10/18/2023 10:00 AM Horton Finer, NP [facility] Gen Psy MUC         Visit this website for Cancer resources at our [facility].  This includes information on [place] Chemo Support Groups, Mind/Body Wellness, Treatment Education Classes, and other [place] [facility] Services:      [email or web address]       *Chief Operating Officer Website:  [email or web address]    Contact Information:   Brock Ra, MD   Assistant Professor of Medicine  Medical Oncology  Genitourinary Malignancy      Franco Nones, M.S., PA-C  Sr. Physician Assistant    Clinical questions or concerns  Oncology Nurse Case Manager   Elsworth Soho RN, BSN  T: [phone]  Department of Urology   [facility]  [address]  [place], [place] [address]    Dr. Roseanne Reno Follow Up Appointment Assistance  Administrative Assistant   Terence Lux  T: [phone]  F: [phone]    Monday-Friday 8:00- 4:30p.m. (Closed Holidays and Weekends)  Please listen to message carefully for daily information.   Leave a detailed message with your name, medical record number and   telephone number. Messages are usually returned within 24 hours.     [facility] Phone List     AFTER HOURS EMERGENCY NUMBER   Ask for the Oncologist  Physician On-Call.                   [phone]    Outpatient [facility]ilion Scheduling line: [phone]  Call for information about our new location and to schedule or cancel appointments.    [facility] Scheduling line: [phone]   Call to schedule your [name], CT Scans or MRI studies.    PET Scan Scheduling line: [phone]  Call to schedule your PET scan    Prostate MRI Scheduling line: [phone] or [phone]  Call to schedule your prostate MRI.   Performed at the [facility] ([facility]).    Nuclear Medicine Scheduling Line [phone]   Call to schedule your bone scan, Radium 223     [facility] Infusion Center: [phone]   Call to schedule, cancel, or reschedule chemotherapy appointments.  Infusion Center Hours--- Monday-Friday 7:00-7:30 p.m.; Sat-Sun 8:00-4:30pm.    [facility] ([address]): [phone]  Monday-Friday 730-530 p.m.; Closed Weekends    Radiation Oncology: [phone]    Call to schedule, cancel, or reschedule radiation appointments    Social Worker  [place]: Mathews [place], [place]    T: [phone]  [place]: Henri Medal, LCSW  T: [phone]    Financial Counselors   [phone] at [facility]   [phone] or [phone]  [facility]  [address] [place] [address]    -You can also reach [name] by MyChart. To set up your MyChart please see the following site: [email or web address]  -For cancer support services, please visit out Patient and [facility].  -For [facility] support services, please visit:  -[email or web address]  -For other support from the American Cancer Society, please visit: [email or web address]/[email or web address]              Thank you for allowing [name] to participate in your care today! We value your feedback as we strive to provide every patient with an exceptional  care experience.     You may receive a patient satisfaction survey in the mail in the next few weeks. Please fill out the survey upon receipt and return it in the self-addressed envelope. Your feedback will be used to help improve the experience for all our patients at [facility].     If you want to share a positive experience you had at our facility please call We Listen at [phone]. If you have any suggestions on how we can improve our services please call the [facility] leadership team at [phone].    For medical questions or emergencies, please call your physician's office or 911. Thank you!

## 2023-08-24 NOTE — Progress Notes (Signed)
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 Currently on enfortumab and pembrolizumab.  - Medical records reviewed    Past Medical History:   Diagnosis Date    Chronic back pain     Congenital hydronephrosis     Gout     Headache     Hematuria     HTN (hypertension) 12/10/2021    Kidney disease     Kidney stones     Major depressive disorder, single episode     Polyarthropathy or polyarthritis of multiple sites     Retinal detachment     Urethral stricture        Past Surgical History:   Procedure Laterality Date    CT INSERTION OF SUPRAPUBIC CATH  09/25/2015    NEPHRECTOMY Right 1955    APPENDECTOMY      COLONOSCOPY      CYSTOSCOPY      CYSTOSCOPY W/ LASER LITHOTRIPSY      OTHER SURGICAL HISTORY      Interstim 01/29/2011    SPINE SURGERY  09/21    Lumbar-sacral fusion    TRANSURETHRAL RESECTION OF PROSTATE         Social History     Socioeconomic History    Marital status: Single   Tobacco Use    Smoking status: Never     Passive exposure: Never    Smokeless tobacco: Never   Vaping Use    Vaping status: Never Used   Substance and Sexual Activity    Alcohol use: No    Drug use: Not Currently    Sexual activity: Not Currently     Partners: Female   Social Activities of Daily Living Present    Military Service No    Blood Transfusions Yes    Caffeine Concern No    Occupational Exposure No    Hobby Hazards No    Sleep Concern Yes     Comment: due to my lower back discomfort and leg discomfort    Stress Concern Yes     Comment: Health/ housing/ financial    Weight Concern No    Special Diet No    Back Care Yes     Comment: Am very careful with any activity    Exercises Regularly Yes    Seat Belt Use Yes    Performs Self-Exams Yes    Bike Helmet Use No       A1c        Na 138 (08/08) CL 98 (08/08) BUN 29* (08/08) GLU   125* (08/08)   K 4.3 (08/08) CO2 28 (08/08) Cr  1.39* (08/08)           WBC 5.8 (08/08) HGB 11.1* (08/08) PLT 233 (08/08)    HCT 32.6* (08/08)           Imaging  Fundus Photo:   OD PVD, Optic nerve perfused without abnormality, macula flat without abnormal changes, vasculature normal caliber and course, periphery flat   OS Optic nerve perfused without abnormality, macula flat without abnormal changes, vasculature normal caliber and course, periphery flat   AF  OD no concerning abnl AF  OS no concerning abnl AF  OCT:   OD flat, normal retinal laminations, small subfoveal IS/OS hyper-reflectivity  OS flat, normal retinal laminations     Research Patient   No evidence of  intraocular malignancy or inflammation on exam today   He has dry eye, but tells me that this is his baseline   [place] testing today was <85mm OU, he has 1+ scattered PEE, low tear lake, and MGD  He uses ATs once a day, I have advised 3-4x per day along with hot compresses     MGD OU   Dry Eye Syndrome OU   As above     Nuclear Sclerosis OU   CTM, not yet VS     Pterygium OU   No malignant features     PVD OD   No RT/RD on exam today

## 2023-08-25 ENCOUNTER — Telehealth (HOSPITAL_BASED_OUTPATIENT_CLINIC_OR_DEPARTMENT_OTHER): Payer: Self-pay

## 2023-08-25 ENCOUNTER — Ambulatory Visit (HOSPITAL_COMMUNITY): Payer: Medicare Other

## 2023-08-25 ENCOUNTER — Encounter (HOSPITAL_BASED_OUTPATIENT_CLINIC_OR_DEPARTMENT_OTHER): Payer: Self-pay | Admitting: Hematology & Oncology

## 2023-08-25 DIAGNOSIS — C7910 Secondary malignant neoplasm of unspecified urinary organs: Secondary | ICD-10-CM

## 2023-08-25 DIAGNOSIS — R7989 Other specified abnormal findings of blood chemistry: Secondary | ICD-10-CM

## 2023-08-25 LAB — URINE CULTURE

## 2023-08-25 NOTE — Telephone Encounter (Signed)
[name] Palliative Care Note    Rec'd mychart message from pt, I called to assess, left vm, will send mychart message.

## 2023-08-25 NOTE — Telephone Encounter (Signed)
Addendum    Called pt's sister Rinaldo Cloud and left a vm asking to call asap and before 430pm.

## 2023-08-25 NOTE — Telephone Encounter (Signed)
From: [name]  To: Margarita Mail, MD  Sent: 08/24/2023 7:36 PM PDT  Subject: Medication request     Hello I have been through a lot getting ready for my clinical trial I have been abit more stressed I need a refill on my Dilantin can you increase my Buspar I don't have an appointment with the new psychiatrist till the end of the month I will see you soon thank you/MR#[id number]

## 2023-08-25 NOTE — Telephone Encounter (Signed)
Per MD, pt needs to hydrate well over the weekend and have BMP re-drawn on Monday.    RN called pt to relay instructions, there was no answer so RN LVM with the above instructions.  BMP lab order is in and can be drawn at any [facility].

## 2023-08-26 ENCOUNTER — Other Ambulatory Visit (HOSPITAL_BASED_OUTPATIENT_CLINIC_OR_DEPARTMENT_OTHER): Payer: Self-pay | Admitting: Nurse Practitioner

## 2023-08-26 DIAGNOSIS — G893 Neoplasm related pain (acute) (chronic): Secondary | ICD-10-CM

## 2023-08-26 DIAGNOSIS — F064 Anxiety disorder due to known physiological condition: Secondary | ICD-10-CM

## 2023-08-26 DIAGNOSIS — C7910 Secondary malignant neoplasm of unspecified urinary organs: Secondary | ICD-10-CM

## 2023-08-26 LAB — URINE CULTURE

## 2023-08-28 ENCOUNTER — Encounter (INDEPENDENT_AMBULATORY_CARE_PROVIDER_SITE_OTHER): Payer: Self-pay | Admitting: Nephrology

## 2023-08-28 ENCOUNTER — Ambulatory Visit (INDEPENDENT_AMBULATORY_CARE_PROVIDER_SITE_OTHER): Payer: Medicare Other

## 2023-08-28 ENCOUNTER — Encounter (HOSPITAL_BASED_OUTPATIENT_CLINIC_OR_DEPARTMENT_OTHER): Payer: Self-pay | Admitting: Hematology & Oncology

## 2023-08-28 DIAGNOSIS — C678 Malignant neoplasm of overlapping sites of bladder: Secondary | ICD-10-CM

## 2023-08-28 DIAGNOSIS — N289 Disorder of kidney and ureter, unspecified: Secondary | ICD-10-CM

## 2023-08-28 DIAGNOSIS — C380 Malignant neoplasm of heart: Secondary | ICD-10-CM

## 2023-08-28 LAB — 2D ECHO WITH IMAGE ENHANCEMENT AGENT IF NECESSARY
AI PHT: 921 msec
IVC Diameter: 1.12 cm
LA Diameter: 4.2 cm
LA Volume Index: 31.8 ml/m2
LV Diastolic Diameter: 5.19 cm
LV Diastolic Volume Index: 55 ml/m2
LV Diastolic Volume: 100.8 ml
LV Ejection Fraction: 59 %
LV Systolic Diameter: 3.79 cm
LV Systolic Volume Index: 23 ml/m2
LV Systolic Volume: 41.3 ml
Mitral E:E' Ratio: 6.48 unitless
PA Pressure: 21 mmHg
TR Velocity: 2.13 m/s

## 2023-08-28 MED ORDER — PREGABALIN 100 MG OR CAPS
100.0000 mg | ORAL_CAPSULE | Freq: Every evening | ORAL | 0 refills | Status: DC
Start: 2023-08-28 — End: 2023-10-01

## 2023-08-28 NOTE — Telephone Encounter (Signed)
VM from patient who states he's getting his CR done tomorrow. He wants to know if this still needs to be done and if so, where to go. Primary NCM already responded to patient as shown below.

## 2023-08-28 NOTE — Telephone Encounter (Signed)
Routed to NP [facility] for approval of requested Lyrica refill  Last filled:   07-26-23 #30 no refills    DOS:   11/08/2021   XLIF L2-3 and PSIF L2 thru pelvis  LOV: 07-03-23  NOV:   09-18-23

## 2023-08-28 NOTE — Telephone Encounter (Signed)
Refill request    Dilaudid 2 mg take 1 q 4 hours # 60 last filled 8/21     Last visit 8/26 Dr. Logan Bores  Next visit 9/16 Dr. Logan Bores      Routing to provider

## 2023-08-29 ENCOUNTER — Encounter (HOSPITAL_BASED_OUTPATIENT_CLINIC_OR_DEPARTMENT_OTHER): Payer: Self-pay | Admitting: Hematology & Oncology

## 2023-08-29 ENCOUNTER — Other Ambulatory Visit (HOSPITAL_BASED_OUTPATIENT_CLINIC_OR_DEPARTMENT_OTHER): Payer: Medicare Other

## 2023-08-29 ENCOUNTER — Encounter (HOSPITAL_BASED_OUTPATIENT_CLINIC_OR_DEPARTMENT_OTHER): Admission: RE | Disposition: A | Discharge status: Home Health | Payer: Self-pay | Attending: Anesthesiology

## 2023-08-29 ENCOUNTER — Encounter (HOSPITAL_BASED_OUTPATIENT_CLINIC_OR_DEPARTMENT_OTHER): Payer: Self-pay | Admitting: Family Medicine

## 2023-08-29 ENCOUNTER — Other Ambulatory Visit: Payer: Self-pay

## 2023-08-29 ENCOUNTER — Ambulatory Visit
Admission: RE | Admit: 2023-08-29 | Discharge: 2023-08-29 | Disposition: A | Discharge status: Home / Self Care | Payer: Medicare Other | Attending: Anesthesiology | Admitting: Anesthesiology

## 2023-08-29 DIAGNOSIS — M7918 Myalgia, other site: Secondary | ICD-10-CM | POA: Insufficient documentation

## 2023-08-29 DIAGNOSIS — R7989 Other specified abnormal findings of blood chemistry: Secondary | ICD-10-CM

## 2023-08-29 DIAGNOSIS — M545 Low back pain, unspecified: Secondary | ICD-10-CM

## 2023-08-29 DIAGNOSIS — M542 Cervicalgia: Secondary | ICD-10-CM | POA: Insufficient documentation

## 2023-08-29 DIAGNOSIS — M539 Dorsopathy, unspecified: Secondary | ICD-10-CM | POA: Insufficient documentation

## 2023-08-29 DIAGNOSIS — C7910 Secondary malignant neoplasm of unspecified urinary organs: Secondary | ICD-10-CM

## 2023-08-29 LAB — BASIC METABOLIC PANEL, BLOOD
Anion Gap: 11 mmol/L (ref 7–15)
BUN: 33 mg/dL — ABNORMAL HIGH (ref 8–23)
Bicarbonate: 30 mmol/L — ABNORMAL HIGH (ref 22–29)
Calcium: 10 mg/dL (ref 8.5–10.6)
Chloride: 101 mmol/L (ref 98–107)
Creatinine: 1.54 mg/dL — ABNORMAL HIGH (ref 0.67–1.17)
Glucose: 110 mg/dL — ABNORMAL HIGH (ref 70–99)
Potassium: 4.6 mmol/L (ref 3.5–5.1)
Sodium: 142 mmol/L (ref 136–145)
eGFR Based on CKD-EPI 2021 Equation: 49 mL/min/{1.73_m2}

## 2023-08-29 LAB — TRIIODOTHYRONINE, FREE: Triiodothyronine, Free: 3 pg/mL (ref 2.5–4.3)

## 2023-08-29 SURGERY — PAIN TPI (2 OR MORE MUSCLES)
Laterality: Bilateral

## 2023-08-29 MED ORDER — BUPIVACAINE HCL (PF) 0.25 % IJ SOLN
INTRAMUSCULAR | Status: DC | PRN
Start: 2023-08-29 — End: 2023-08-29
  Administered 2023-08-29: 8 mL via PERINEURAL

## 2023-08-29 MED ORDER — HYDROMORPHONE HCL 2 MG OR TABS
ORAL_TABLET | ORAL | 0 refills | Status: DC
Start: 2023-08-29 — End: 2023-09-11
  Filled 2023-08-29: qty 60, 5d supply, fill #0

## 2023-08-29 SURGICAL SUPPLY — 5 items
APPLICATOR CHLORAPREP 3ML, CLEAR (Prep Solutions) ×1 IMPLANT
GLOVE BIOGEL PI ULTRATOUCH SIZE 7.5 (Gloves/Gowns) ×1 IMPLANT
NEEDLE BD HYPO 27G X 1.25" (Needles/punch/cannula/biopsy) ×1
NEEDLE PROTECT 18G X 1.5" (Needles/punch/cannula/biopsy) ×1 IMPLANT
SYRINGE HYPO LL 5CC (Needles/punch/cannula/biopsy) ×1

## 2023-08-29 NOTE — Telephone Encounter (Signed)
Addendum    Pt will be at [facility] this am, pick up Dilaudid at [facility], routing to team

## 2023-08-29 NOTE — Telephone Encounter (Signed)
[name] Progress Note    Received refill request for hydromorphone 2mg  to 4mg  PO q4h PRN pain.  Patient's last appointment with our team was on 08/14/23 and patient's next appointment with our team is on 09/04/23.  Patient has Narcan PRN on file.  Reviewed CURES, per CURES, last picked up hydromorphone 2mg  #60 on 08/11/23, pregabalin 100mg  #30 on 07/30/23, lorazepam 0.5mg  #2 on 07/16/23.  No aberrancies noted.  Will send refill as requested.    Shea Sheri Gatchel, MD  Assistant Professor  [facility] Palliative Care Service  [email or web address]

## 2023-08-29 NOTE — Op Note (Signed)
Procedure Note, [facility]    Preoperative Diagnosis: Cervicalgia.  Dorsopathy    Postoperative Diagnosis: Cervicalgia.  Dorsopathy    Procedures: 3 or more muscle group trigger point injections    Surgeon/Staff:  Aniel Hubble S. Earlene Plater, MD  Assistant:     Indications: The patient c/o tenderness and discomfort with palpation of cervical paraspinous and periscapular muscles    Conservative measures have failed to relieve their pain. This is the first injection performed for this patient..     Procedure in detail:  Written informed consent was obtained.  The chart was reviewed, questions were answered and the patient wished to proceed. The patient had no contraindications to the procedure.  Vital signs were stable. Standard monitoring was applied.  The patient and the physician confirmed the site of injection after an official time out.    Localization Time Out:  An additional intraoperative timeout, specifically to confirm accurate localization, was conducted by the attending physician.     The patient remained awake and alert throughout the procedure.  The patient was placed in the prone position.  The skin was prepped with chlorhexidine and sterile drapes were applied.  Using palpation of bony and soft tissue landmarks, active trigger points were located in the following muscle groups:  bilateral cervical  paraspinals, bilateral upper  trapezius,  bilateral levator  scapulae ,  bilateral supraspinatus  , bilateral rhomboids  , bilateral lumbar  paraspinous , and right quadatrus  lumborum    Next these muscles were injected with a total of  Bupivicaine (0.25%)  3 mL using a 30 gauge 1 inch needle for the cervical and 5mL using a 27 gauge 1.5 inch needle for the lumbar. Aspriation was negative. The needle was removed.  A sterile bandage was applied.  The patient did not experience any hemodynamic or neurologic sequelae.  Marland Kitchen     Post operative instructions were explained and given to the patient. The patient was  discharged in stable health.

## 2023-08-29 NOTE — Discharge Instructions (Addendum)
Patient verbalized understanding of below instructions    Procedure Done Today: Trigger point injections, Side/ Site: Bilateral Neck and shoulder muscles - Bilateral     Follow-up at the Pain Clinic in 4-6 weeks with Dr. Earlene Plater or Gordy Clement    Post Procedure Instructions  Continue present medication unless otherwise indicated.  Resume your normal diet after being discharge.  Ice pack to treatment site (no more than 20 minutes at a time) if needed for pain relief and/or muscle spasm.  Avoid strenuous activities and driving until tomorrow morning.  If you have a band-aid dressing, you may remove it tomorrow morning.  Resume normal activities tomorrow morning, unless otherwise directed.  No submersion in water for 24 hrs.    If your next appointment is for a "PAIN CLINIC FOLLOW-UP EVALUATION", please call [phone] to make an appointment.     If your next appointment is for a "PAIN PROCEDURE", please call [phone] to schedule an appointment.    If your next appointment is with the "ORTHOPEDIC CLINIC", please call [phone].     If another procedure was ordered:    For routine orders, please allow up to 10 business days for the authorization to be submitted to your insurance. Your insurance will process the request. Once your procedure is authorized you will then be contacted by our team to be scheduled. If you have not heard from [name] after 10 business days, please, call the Pain Clinic to check on your authorization status [id number].    [facility], [address], [place], [place], [address]     Please log on to [place] Product/process development [email or web address]) before every clinic visit to complete your electronic questionnaire. This will help our team to provide you with the best personalized treatments and will help [name] to get your treatments approved by your insurance.    Patient Field seismologist Electronics engineer) [phone]  Covered [place] Option Hotline [phone]  Medical Records [phone]        Hours of Operation for Medical Records:  [facility] Monday - Friday 730am-4pm  Radiology Imaging Scheduling [phone]  [place] Insurance Information (Contracting) [phone]  website:  [email or web address]    Call the Pain Clinic at [phone] during business hours and for after hours or weekends call the [facility] at [phone] and ask for the Pain Management Fellow on-call for ANY signs of:  Temperature above 101.5 F  Redness or drainage at the treatment site  Severe, uncontrollable headache    Call 911 IN CASE OF EMERGENCY

## 2023-08-29 NOTE — H&P (Signed)
Ambulatory Surgery/Invasive Procedure History and Physical      Primary Care Physician No Pcp, Per Patient    Chief Complaint:  neck pain and low back pain     72 year old male here for trigger point injections for pain control.      BP 121/67   Temp 97.6 F (36.4 C)   Resp 15   SpO2 97%     Past Medical History:   Diagnosis Date    Chronic back pain     Congenital hydronephrosis     Gout     Headache     Hematuria     HTN (hypertension) 12/10/2021    Kidney disease     Kidney stones     Major depressive disorder, single episode     Polyarthropathy or polyarthritis of multiple sites     Retinal detachment     Urethral stricture        Allergies   Allergen Reactions    Sulfa Drugs Unspecified, Hives and Other       Current Facility-Administered Medications   Medication Dose Route Frequency Provider Last Rate Last Admin    BUPivacaine 0.25% PF injection    Intra-Op PRN Jerelyn Charles, MD   8 mL at 08/29/23 0910       I have reviewed the past medical history, allergies and current medications as documented in the electronic health record.      Physical Exam       Can this patient make their own healthcare decisions?  Yes    Chest:  Breaths easily     Heart:  RRR    Abdomen:  Soft    Pain Management Needs/Options discussed.    No Advanced Directives.      Resuscitative Status:  Full Code, Full Care    Diagnosis: Myalgia other      Procedure(s):  Trigger point injections, Side/ Site:  Bilateral Neck and shoulder muscles    This is the first injection of this type.     Discussed Risks, Benefits, and Alternatives to procedure.  Questions answered.  Patient voiced understanding and wished to proceed. Consent signed.    Consent signed with patient using ipad and electronic signature option.    Risk include:    Minor adverse effects: Superficial infection, bleeding, bruising, headache, worsening of pain, lack of benefit, flushing or redness of the face and neck, elevated blood pressure, increased blood sugar,  difficulty sleeping, changes in mood, nausea, or vomiting. A transient sensation of tingling or shooting during the injection.    Rare but serious adverse effects: Allergic reaction, internal bleeding, infection in deeper tissue such as the discs or spine requiring prolonged or intravenous antibiotics or surgery, punctured lungs, changes in blood pressure, pulse and respirations, cardiac arrest, permanented injury to nerves or spinal cord, paralysis, stroke and even death.     See procedure note of same date.

## 2023-08-30 ENCOUNTER — Ambulatory Visit
Admission: RE | Admit: 2023-08-30 | Discharge: 2023-08-30 | Disposition: A | Discharge status: Home Health | Payer: Medicare Other

## 2023-08-30 ENCOUNTER — Ambulatory Visit (HOSPITAL_BASED_OUTPATIENT_CLINIC_OR_DEPARTMENT_OTHER): Admit: 2023-08-30 | Discharge: 2023-08-30 | Disposition: A | Discharge status: Home Health | Payer: Medicare Other

## 2023-08-30 ENCOUNTER — Encounter (HOSPITAL_BASED_OUTPATIENT_CLINIC_OR_DEPARTMENT_OTHER): Payer: Self-pay | Admitting: Hematology & Oncology

## 2023-08-30 ENCOUNTER — Other Ambulatory Visit: Payer: Self-pay

## 2023-08-30 DIAGNOSIS — C7910 Secondary malignant neoplasm of unspecified urinary organs: Secondary | ICD-10-CM

## 2023-08-30 NOTE — Addendum Note (Signed)
Encounter addended by: Erling Cruz on: 08/30/2023 3:53 AM   Actions taken: Charge Capture section accepted

## 2023-08-31 ENCOUNTER — Ambulatory Visit (HOSPITAL_BASED_OUTPATIENT_CLINIC_OR_DEPARTMENT_OTHER): Payer: Medicare Other

## 2023-08-31 ENCOUNTER — Other Ambulatory Visit (HOSPITAL_BASED_OUTPATIENT_CLINIC_OR_DEPARTMENT_OTHER): Payer: Self-pay | Admitting: Urology

## 2023-08-31 ENCOUNTER — Ambulatory Visit (HOSPITAL_BASED_OUTPATIENT_CLINIC_OR_DEPARTMENT_OTHER): Admit: 2023-08-31 | Discharge: 2023-08-31 | Disposition: A | Discharge status: Home Health | Payer: Medicare Other

## 2023-08-31 DIAGNOSIS — R339 Retention of urine, unspecified: Secondary | ICD-10-CM

## 2023-08-31 MED ORDER — TAMSULOSIN HCL 0.4 MG PO CAPS
ORAL_CAPSULE | ORAL | 1 refills | Status: AC
Start: 2023-08-31 — End: ?

## 2023-09-01 ENCOUNTER — Telehealth (HOSPITAL_BASED_OUTPATIENT_CLINIC_OR_DEPARTMENT_OTHER): Payer: Self-pay

## 2023-09-01 ENCOUNTER — Ambulatory Visit: Payer: Medicare Other

## 2023-09-01 DIAGNOSIS — C662 Malignant neoplasm of left ureter: Secondary | ICD-10-CM

## 2023-09-01 DIAGNOSIS — I491 Atrial premature depolarization: Secondary | ICD-10-CM

## 2023-09-01 DIAGNOSIS — I498 Other specified cardiac arrhythmias: Secondary | ICD-10-CM

## 2023-09-01 DIAGNOSIS — C7910 Secondary malignant neoplasm of unspecified urinary organs: Secondary | ICD-10-CM | POA: Insufficient documentation

## 2023-09-01 DIAGNOSIS — R001 Bradycardia, unspecified: Secondary | ICD-10-CM

## 2023-09-01 LAB — ECG 12-LEAD
ATRIAL RATE: 63 {beats}/min
ATRIAL RATE: 64 {beats}/min
ATRIAL RATE: 68 {beats}/min
ECG INTERPRETATION: NORMAL
P AXIS: 37 degrees
P AXIS: 41 degrees
P AXIS: 51 degrees
PR INTERVAL: 190 ms
PR INTERVAL: 194 ms
PR INTERVAL: 198 ms
QRS INTERVAL/DURATION: 108 ms
QRS INTERVAL/DURATION: 110 ms
QRS INTERVAL/DURATION: 110 ms
QT: 416 ms
QT: 420 ms
QT: 428 ms
QTc (Bazett): 429 ms
QTc (Bazett): 437 ms
QTc (Bazett): 446 ms
QTc (Fredericia): 425 ms
QTc (Fredericia): 435 ms
QTc (Fredericia): 438 ms
R AXIS: 42 degrees
R AXIS: 43 degrees
R AXIS: 49 degrees
T AXIS: -5 degrees
T AXIS: 24 degrees
T AXIS: 27 degrees
VENTRICULAR RATE: 63 {beats}/min
VENTRICULAR RATE: 64 {beats}/min
VENTRICULAR RATE: 68 {beats}/min

## 2023-09-01 NOTE — Telephone Encounter (Signed)
See MyChart message

## 2023-09-01 NOTE — Interdisciplinary (Signed)
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  Patient has multiple co-morbidities that pose substantial risk if exposed to COVID-19.       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 FGFR-TACC mutation.  Started on EV+Pembro.       Scans after 4 cycles with improvement of osseous lesions, but demonstrate substantial sclerotic osseous lesions, suggesting treated lesions not scene prior.  Unfortunately developed POD after 8 cycles.     Here currnently undergoing screening for [id number] study.  ______________________________________________________________________  ______________________________________________________________________      Chief Complaint [Nutritional]: Nutritional Optimization    RD Assessment: Pt presents unaccompanied to video visit. Continues w/ stable PO intake, drinks 3 nepro shakes daily in addition to meals and snacks, states he tries to eat as much as he can/has available to him. Continues to receive Nepro via [facility].     Diet recall:   B: breakfast bar yogurt + NEPRO  L: sandwich and pudding +NEPRO  D: salad and whatever sister prepares + NEPRO    Weight stable/continues to slowly trend up, see below for details. He is pending starting a new treatment however his creatinine levels have been elevated so he has not started treatment yet, they are planning  to redraw labs and reassess. Pt has been advised by MD to continue pushing fluids intake and hydrate very well.     Encouraged pt to continue w/ stable PO intake promoting weight maintenance. Continue small frequent meals every 2-3 hours throughout the day and continue supplementing with 3 nepro/day to aid in meeting nutrient needs. Reiterated recs for adequate fluids and reviewed fluid goals to support healthy kidney function. Pt v/u, will continue to follow closely.       GI: Denies N/V/D/C, last BM was this morning. Reports he continues to lean more towards constipation/slow bowel movements taking senna as needed.       Diet Type: oral    Estimated Nutrition Needs:       Calories: 2250-2625 kcal (30-35 kcal/kg)     Protein: 90-112 gm (1.2-1.5 gm/kg)     Fluids: 1 mL/kcal OR per MD            Food allergies/intolerances: NKFA     Supplements: MVI    Education / Intervention:  Focus on scheduled eating pattern of small freq meals every 2-3 hours throughout the day  Choose high calorie high protein foods to maximize intake  Recommend oral nutrition supplement to support meeting nutrient needs: Nepro 3x/day as warranted   Continue working on hydration status  Continue follow up w/ SLP and diet texture per their recs: Easy to chew - 7, Drinks = Thins - 0     Goals: - Pt to demonstrate intake of >80% estimated needs with good tolerance as evidenced by pt  report.        ____________________________________________________________________________________________________________________________________________    Vitals:  Ht:   Height - Most Recent Measurement   08/24/23 5' 6.14" (1.68 m)      Wt.:    9/5: 162#   8/7: reports recent weight of 160# - pt presents with ~5# weight gain over the past 2-3 months   05/04/23: Pt presents with additional wt loss, reports weight  recently fluctuating between: 154#, - 156#  02/23/23: pt reports ~20# weight loss in the last month (significant 12.5% weight loss in 1 month)  Wt Readings  from Last 1 Encounters:   08/24/23 73.7 kg (162 lb 6.3 oz)           BMI: Estimated body mass index is 26.1 kg/m as calculated from the following:    Height as of 08/24/23: 5' 6.14" (1.68 m).    Weight as of 08/24/23: 73.7 kg (162 lb 6.3 oz).  Wt hx: UBW: 184#  Wt Readings from Last 10 Encounters:   08/24/23 73.7 kg (162 lb 6.3 oz)   08/24/23 73.7 kg (162 lb 6.4 oz)   08/14/23 73.2 kg (161 lb 4.8 oz)   08/10/23 72.6 kg (160 lb)   07/27/23 72 kg (158 lb 11.7 oz)   07/27/23 73 kg (160 lb 15 oz)   07/20/23 74.2 kg (163 lb 9.3 oz)   07/20/23 74.2 kg (163 lb 9.3 oz)   07/20/23 74.2 kg (163 lb 9.3 oz)   07/06/23 71.9 kg (158 lb 8.2 oz)         Labs:  Lab Results   Component Value Date    WBC 6.3 08/24/2023    RBC 3.58 (L) 08/24/2023    HGB 10.7 (L) 08/24/2023    HCT 32.4 (L) 08/24/2023    MCV 90.5 08/24/2023    MCHC 33.0 08/24/2023    RDW 15.5 (H) 08/24/2023    PLT 209 08/24/2023    MPV 10.4 08/24/2023    ABSNEUTRO 4.0 08/24/2023    IANC 3.3 07/27/2023     Lab Results   Component Value Date    NA 142 08/29/2023    K 4.6 08/29/2023    CL 101 08/29/2023    BICARB 30 (H) 08/29/2023    BUN 33 (H) 08/29/2023    CREAT 1.54 (H) 08/29/2023    GLU 110 (H) 08/29/2023     10.0 08/29/2023     Lab Results   Component Value Date    AST 20 08/24/2023    ALT 15 08/24/2023    ALK 140 (H) 08/24/2023    TP 7.0 08/24/2023    ALB 4.1 08/24/2023    TBILI 0.53 08/24/2023     Lab Results   Component Value Date    PHOS 3.5 08/24/2023    TRIG 148 08/24/2023    CHOL 132 08/24/2023    MG 2.0 08/24/2023         Medications/supplements:  Current Outpatient Medications   Medication Sig    acetaminophen (TYLENOL) 500 MG tablet Take 2 tablets (1,000 mg) by mouth every 8 hours as needed for Mild Pain (Pain Score 1-3) or Moderate Pain (Pain Score 4-6).    albuterol (PROAIR HFA) 108 (90 Base) MCG/ACT inhaler ProAir HFA 90 mcg/actuation aerosol inhaler    allopurinol (ZYLOPRIM) 100 MG tablet Take 1 tablet (100 mg) by mouth daily.    Artificial Tear Solution  (SOOTHE XP OP)     busPIRone (BUSPAR) 15 MG tablet Take 1 tablet (15 mg) by  mouth 2 times daily.    diclofenac (VOLTAREN) 1 % gel Apply 2 g topically every 6 hours as needed (Right knee pain).    DULoxetine (CYMBALTA) 20 MG CR capsule Take 1 capsule (20 mg) by mouth 2 times daily.    fluticasone propionate (FLONASE) 50 MCG/ACT nasal spray 2 sprays.    HYDROmorphone (DILAUDID) 2 MG tablet Take 1 to 2 tablets (2mg  to 4mg ) by mouth every 4 hours as needed for moderate to severe pain    hydrOXYzine HCL (ATARAX) 25 MG tablet Take 1 tablet (25 mg) by mouth every 8 hours as needed for Anxiety.    ibuprofen (MOTRIN) 600 MG tablet     ketotifen (ALAWAY) 0.025 % ophthalmic solution Place 1 drop into both eyes 2 times daily.    loperamide (IMODIUM) 2 MG capsule For mild to moderate diarrhea (less than 5 BMs within 24 hours):  Take 1 capsule (2 mg) by mouth 4 times daily as needed for Diarrhea.  For severe Diarrhea (5 or more medium to large BMs within 24 hours):  take 2 capsules and then 1 capsule every 2 hours until diarrhea stops.    loratadine (CLARITIN REDITABS) 10 MG dissolvable tablet     LORazepam (ATIVAN) 0.5 MG tablet Take 1 tablet by mouth 30 to 60 minutes prior to MRI.  May repeat dose once if needed.    Multiple Vitamins-Minerals (MULTIVITAMIN WITH MINERALS) TABS tablet Take 1 tablet by mouth daily.    naloxone (NARCAN) 4 mg/0.1 mL nasal spray For suspected opioid overdose, call 911! Then spray once in one nostril. Repeat after 3 minutes if no or minimal response using a new spray in other nostril.    Nutritional Supplements (NEPRO) LIQD Take 3 Cans by mouth daily.    polyethylene glycol (MIRALAX) 17 g packet Take 1 packet (17 g) by mouth 2 times daily as needed for Constipation.    pregabalin (LYRICA) 100 MG capsule Take 1 capsule (100 mg) by mouth at bedtime.    prochlorperazine (COMPAZINE) 10 MG tablet Take 1 tablet (10 mg) by mouth every 6 hours as needed (Nausea/Vomiting).    senna (SENOKOT) 8.6 MG tablet Take  2 tablets (17.2 mg) by mouth 2 times daily.    tamsulosin (FLOMAX) 0.4 MG capsule TAKE 1 CAPSULE BY MOUTH EVERY DAY    tizanidine (ZANAFLEX) 2 MG tablet Take 1 tablet (2 mg) by mouth every 8 hours as needed (muscle spasm).    traZODone (DESYREL) 50 MG tablet Take one tab nightly by mouth at bedtime as needed for insomnia     No current facility-administered medications for this visit.          Past Medical / Surgical History:  Past Medical History:   Diagnosis Date    Chronic back pain     Congenital hydronephrosis     Gout     Headache     Hematuria     HTN (hypertension) 12/10/2021    Kidney disease     Kidney stones     Major depressive disorder, single episode     Polyarthropathy or polyarthritis of multiple sites     Retinal detachment     Urethral stricture      Past Surgical History:   Procedure Laterality Date    CT INSERTION OF SUPRAPUBIC CATH  09/25/2015    NEPHRECTOMY Right 1955    APPENDECTOMY      COLONOSCOPY      CYSTOSCOPY      CYSTOSCOPY W/ LASER  LITHOTRIPSY      OTHER SURGICAL HISTORY      Interstim 01/29/2011    SPINE SURGERY  09/21    Lumbar-sacral fusion    TRANSURETHRAL RESECTION OF PROSTATE           Readiness to change: Ready  Expected Compliance: Good                             Barriers to Learning/Compliance: None  Pt. Verbalized understanding. Patient confirmed all questions and concerns were addressed to satisfaction, email provided for interim questions.  Monitoring and Evaluation:  - Monitor po intake/diet tolerance  - Monitor BM pattern  - Monitor weight changes.        Follow up in 4-6 week- message sent to scheduling team to reach out and coordinate       Riley Lam, RD, CSO    ---------------------(data below generated by Riley Lam, RD)--------------------     Patient Verification & Telemedicine Consent & Financial Waiver:    1.   Identity: I have verified this patient's identity to be accurate.  2.   Consent: I verify consent has been secured in one of the  following methods: (a) obtained written/ online attestation consent (via MyChartVideoVisit pathway), (b) the spoke-side provider has obtained verbal or written consent from patient/surrogate (if this is a "provider to provider" evaluation), or (c) in all other cases, I have personally obtained verbal consent from the patient/ surrogate (noting all elements below) to perform this voluntary telemedicine evaluation (including obtaining history, performing examination and reviewing data provided by the patient).   The patient/ surrogate has the right to refuse this evaluation.  I have explained risks (including potential loss of confidentiality), benefits, alternatives, and the potential need for subsequent face to face care. Patient/ surrogate understands that there is a risk of medical inaccuracies given that our recommendations will be made based on reported data (and we must therefore assume this information is accurate).  Knowing that there is a risk that this information is not reported accurately, and that the telemedicine video, audio, or data feed may be incomplete, the patient agrees to proceed with evaluation and holds [name] harmless knowing these risks.  3.   Healthcare Team: The patient/ surrogate has been notified that other healthcare professionals (including students, residents and Engineer, maintenance) may be involved in this audio-video evaluation.   All laws concerning confidentiality and patient access to medical records and copies of medical records apply to telemedicine.  4.   Privacy: If this is a Restaurant manager, fast food Visit, the patient/ surrogate has received the [place] Notice of Privacy Practices via E-Checkin process.  For all other video visit techniques, I have verbally provided the patient/ surrogate with the [facility] web link in [place] ([email or web address]) or Spanish ([email or web address]).  The patient/ surrogate acknowledges both being provided the  NPP link, and has been offered to have the NPP mailed to the patient/ surrogate by [name] mail.  The patient/ surrogate has voiced understanding an acknowledgement of receipt of this NPP web address.  If the patient/surrogate has elected to receive the NPP via [name] mail, I verify that the NPP will be sent promptly to the patient/surrogate via [name] mail.  5.   Capacity: I have reviewed this above verification and consent paragraph with the patient/ surrogate and the patient is capacitated or has a surrogate. If the patient is not capacitated to understand the above, and no surrogate is available,  since this is not an emergency evaluation, the visit will be rescheduled until such time that the patient can consent, or the surrogate is available to consent. If this is an emergency evaluation and the patient is not capacitated to understand the above, and no surrogate is available, I am proceeding with this evaluation as this is felt to be an emergency setting and no appropriate specialist is available at the bedside to perform these evaluations.  6.   Financial Waiver: If this is a Restaurant manager, fast food Visit, the patient has been made aware of the financial waiver via E-Checkin process.  For all other video visit techniques, an E-Checkin process is not performed.  As such, I have personally verbally informed the patient/ surrogate that this evaluation will be a billable encounter similar to an in-person clinic visit, and the patient/ surrogate has agreed to pay the fee for services rendered.  If we are billing insurance for the patient's telehealth visit, his out-of-pocket cost will be determined based on his plan and will be billed to him.  The patient/ surrogate has also been informed that if the patient does not have insurance or does not wish to use insurance, [facility] [address] [employer] price for a primary care telehealth visit is $59.00 and specialist telehealth visit is $88.00.  I have further informed the patient/ surrogate  that in the event the patient has additional services provided in conjunction with the specialty visit (Ex. Psychotherapy services), those services will be billed at the current rate less a 45% discount.  7.   Intra-State Location: The patient/ surrogate attests to understanding that if the patient accesses these services from a location outside of [place], that the patient does so at the patient's own risk and initiative and that the patient is ultimately responsible for compliance with any laws or regulations associated with the patient's use.  8.   Specific Use:The patient/ surrogate understands that [facility] makes no representation that materials or servicesdelivered via telecommunication services, or listed on telemedicine websites, are appropriate or available for use in any other location.           Demographics:  Medical Record #: [id number]  Date: September 01, 2023  Patient Name: Stephen [name]  DOB: 02/28/1951  Age: 72 year old  Sex: male  Location: Home address on file  Patient seen Status: Patient was evaluated via telemedicine (audio or audio/video)     Evaluator(s):  Gaylon Bentz was evaluated by me today.    Clinic Location: [facility]   [facility]  [address]  [place] [address]

## 2023-09-03 ENCOUNTER — Encounter (HOSPITAL_BASED_OUTPATIENT_CLINIC_OR_DEPARTMENT_OTHER): Payer: Self-pay

## 2023-09-04 ENCOUNTER — Ambulatory Visit (HOSPITAL_BASED_OUTPATIENT_CLINIC_OR_DEPARTMENT_OTHER): Payer: Medicare Other

## 2023-09-04 ENCOUNTER — Encounter (HOSPITAL_BASED_OUTPATIENT_CLINIC_OR_DEPARTMENT_OTHER): Payer: Self-pay

## 2023-09-04 ENCOUNTER — Other Ambulatory Visit: Payer: Self-pay

## 2023-09-04 ENCOUNTER — Ambulatory Visit (HOSPITAL_BASED_OUTPATIENT_CLINIC_OR_DEPARTMENT_OTHER): Payer: Medicare Other | Admitting: Family Medicine

## 2023-09-04 ENCOUNTER — Ambulatory Visit
Admission: RE | Admit: 2023-09-04 | Discharge: 2023-09-04 | Disposition: A | Discharge status: Home / Self Care | Payer: Medicare Other | Attending: Vascular & Interventional Radiology | Admitting: Vascular & Interventional Radiology

## 2023-09-04 VITALS — HR 62 | Temp 98.2°F | Resp 16

## 2023-09-04 VITALS — BP 122/65 | HR 51 | Temp 97.5°F | Resp 16 | Ht 66.14 in | Wt 164.7 lb

## 2023-09-04 DIAGNOSIS — F419 Anxiety disorder, unspecified: Secondary | ICD-10-CM

## 2023-09-04 DIAGNOSIS — G893 Neoplasm related pain (acute) (chronic): Secondary | ICD-10-CM

## 2023-09-04 DIAGNOSIS — Z515 Encounter for palliative care: Secondary | ICD-10-CM

## 2023-09-04 DIAGNOSIS — Z5189 Encounter for other specified aftercare: Secondary | ICD-10-CM

## 2023-09-04 DIAGNOSIS — R413 Other amnesia: Secondary | ICD-10-CM

## 2023-09-04 DIAGNOSIS — M47812 Spondylosis without myelopathy or radiculopathy, cervical region: Secondary | ICD-10-CM

## 2023-09-04 DIAGNOSIS — C7910 Secondary malignant neoplasm of unspecified urinary organs: Secondary | ICD-10-CM

## 2023-09-04 DIAGNOSIS — G4700 Insomnia, unspecified: Secondary | ICD-10-CM

## 2023-09-04 DIAGNOSIS — K5903 Drug induced constipation: Secondary | ICD-10-CM

## 2023-09-04 DIAGNOSIS — C7951 Secondary malignant neoplasm of bone: Secondary | ICD-10-CM

## 2023-09-04 DIAGNOSIS — T402X5A Adverse effect of other opioids, initial encounter: Secondary | ICD-10-CM

## 2023-09-04 DIAGNOSIS — M5412 Radiculopathy, cervical region: Secondary | ICD-10-CM

## 2023-09-04 DIAGNOSIS — C689 Malignant neoplasm of urinary organ, unspecified: Secondary | ICD-10-CM | POA: Insufficient documentation

## 2023-09-04 DIAGNOSIS — Z9189 Other specified personal risk factors, not elsewhere classified: Secondary | ICD-10-CM

## 2023-09-04 DIAGNOSIS — N2889 Other specified disorders of kidney and ureter: Secondary | ICD-10-CM

## 2023-09-04 DIAGNOSIS — R5383 Other fatigue: Secondary | ICD-10-CM

## 2023-09-04 DIAGNOSIS — Z9181 History of falling: Secondary | ICD-10-CM

## 2023-09-04 DIAGNOSIS — F411 Generalized anxiety disorder: Secondary | ICD-10-CM

## 2023-09-04 LAB — CBC WITH DIFF, BLOOD
ANC-Automated: 3.8 10*3/uL (ref 1.6–7.0)
Abs Basophils: 0.1 10*3/uL (ref <0.2)
Abs Eosinophils: 0.2 10*3/uL (ref 0.0–0.5)
Abs Lymphs: 2.2 10*3/uL (ref 0.8–3.1)
Abs Monos: 0.7 10*3/uL (ref 0.2–0.8)
Basophils: 0.9 %
Eosinophils: 2.7 %
Hct: 35.4 % — ABNORMAL LOW (ref 40.0–50.0)
Hgb: 11.3 gm/dL — ABNORMAL LOW (ref 13.7–17.5)
Imm Gran %: 0.1 % (ref <1)
Imm Gran Abs: 0 10*3/uL (ref <0.1)
Lymphocytes: 30.9 %
MCH: 29.1 pg (ref 26.0–32.0)
MCHC: 31.9 g/dL — ABNORMAL LOW (ref 32.0–36.0)
MCV: 91.2 um3 (ref 79.0–95.0)
MPV: 10.1 fL (ref 9.4–12.4)
Monocytes: 10.6 %
Plt Count: 211 10*3/uL (ref 140–370)
RBC: 3.88 10*6/uL — ABNORMAL LOW (ref 4.60–6.10)
RDW: 15.9 % — ABNORMAL HIGH (ref 12.0–14.0)
Segs: 54.8 %
WBC: 7 10*3/uL (ref 4.0–10.0)

## 2023-09-04 LAB — COMPREHENSIVE METABOLIC PANEL, BLOOD
ALT (SGPT): 65 U/L — ABNORMAL HIGH (ref 0–41)
AST (SGOT): 56 U/L — ABNORMAL HIGH (ref 0–40)
Albumin: 4.2 g/dL (ref 3.5–5.2)
Alkaline Phos: 199 U/L — ABNORMAL HIGH (ref 40–129)
Anion Gap: 10 mmol/L (ref 7–15)
BUN: 31 mg/dL — ABNORMAL HIGH (ref 8–23)
Bicarbonate: 28 mmol/L (ref 22–29)
Bilirubin, Tot: 0.54 mg/dL (ref <1.2)
Calcium: 9.8 mg/dL (ref 8.5–10.6)
Chloride: 101 mmol/L (ref 98–107)
Creatinine: 1.33 mg/dL — ABNORMAL HIGH (ref 0.67–1.17)
Glucose: 129 mg/dL — ABNORMAL HIGH (ref 70–99)
Potassium: 4.1 mmol/L (ref 3.5–5.1)
Sodium: 139 mmol/L (ref 136–145)
Total Protein: 7.1 g/dL (ref 6.0–8.0)
eGFR Based on CKD-EPI 2021 Equation: 58 mL/min/{1.73_m2}

## 2023-09-04 MED ORDER — BUSPIRONE HCL 15 MG OR TABS
15.0000 mg | ORAL_TABLET | Freq: Three times a day (TID) | ORAL | 2 refills | Status: DC
Start: 2023-09-04 — End: 2023-10-18
  Filled 2023-09-04: qty 90, 30d supply, fill #0

## 2023-09-04 MED ORDER — HEPARIN SODIUM LOCK FLUSH 100 UNIT/ML IJ SOLN CUSTOM
500.0000 [IU] | INTRAVENOUS | Status: DC | PRN
Start: 2023-09-04 — End: 2023-09-05
  Administered 2023-09-04: 500 [IU] via INTRAVENOUS

## 2023-09-04 MED ORDER — SODIUM CHLORIDE 0.9 % IJ SOLN (CUSTOM)
20.0000 mL | INTRAMUSCULAR | Status: DC | PRN
Start: 2023-09-04 — End: 2023-09-05
  Administered 2023-09-04: 20 mL via INTRAVENOUS

## 2023-09-04 MED ORDER — LACTULOSE 10 GM/15ML OR SOLN
ORAL | 2 refills | Status: DC
Start: 2023-09-04 — End: 2023-11-28
  Filled 2023-09-04: qty 473, 6d supply, fill #0
  Filled 2023-10-12: qty 473, 6d supply, fill #1

## 2023-09-04 MED ORDER — LIDOCAINE HCL (PF) 1 % IJ SOLN
0.3000 mL | INTRAMUSCULAR | Status: DC | PRN
Start: 2023-09-04 — End: 2023-09-05

## 2023-09-04 NOTE — Patient Instructions (Addendum)
[facility] Palliative Care       PLAN OF CARE:    -Continue hydromorphone to 2mg  2 tablets (4mg ) by mouth every 4 hours as needed for pain.  I recommend you try taking this around the clock for pain.  -Continue duloxetine CR (Cymbalta) 20mg  by mouth 2 times a day  -Continue pregabalin (Lyrica) 100mg  by mouth daily  -Continue Senna 1 tablet by mouth 2 times a day  -Continue docusate 100mg  by mouth twice a day as needed for constipation  -Continue Miralax 1 tablespoon in water daily as needed for constipation  -We will add lactulose 15 milliliters to 30 milliliters by mouth 3 times daily as needed for constipation  -We will increase Buspar to 15mg  by mouth 3 times a day  -Continue prochlorperazine 10mg  by mouth every 6 hours as needed for nausea  -Continue follow up with Psychiatry (establishing with new provider soon)     We will see you for follow up on 10/09/23 at 9 AM in person at [place] (60  minutes)    List of Palliative Care Providers:  Theda Belfast, NP  Gaspar Skeeters, NP  Margarita Mail, MD  Davy Pique, NP  Vivien Presto, NP  Novella Rob, MD  Berenice Primas, MD  Clint Guy MD  Era Skeen, DO FAAHPM  Awilda Metro, MD    Administrative Assistant/Team Coordinator   Jeannetta Ellis   Alcide Clever     Social Workers:   Carleene Mains, [place]    Spiritual Counselor/Chaplain  Lehman Prom, [facility]   Dan Europe, [place] CME     Nurse Case Managers  Sherald Barge, RN   Caralee Ates, RN  Reva Pedro Earls, RN   Amie Bronson Curb, LVN  Lucilla Lame, [place]  .............................................................................................................................................      HOW TO CONTACT PALLIATIVE CARE  NURSE LINE--------[phone]--leave a voicemail and we will call you back  SCHEDULING------[phone]  FAX--------------------[phone]   [place]------Use MyChart Messaging for non-emergent issues  ?   HOURS: Monday through Friday 8:00 am - 5:00 pm?(closed holidays and  weekends)      AFTER HOURS: For urgent symptom issues after hours, call [phone] and ask for the Doctor-on-Call for Palliative Care.   ?   Emergency: Call 911 or go to the nearest emergency room.?   .............................................................................................................................................   ?  [place] Help Desk: [phone]      ............................................................................................................................................      Special Instructions Regarding Medications:   ?   1. Prescription Refills       -4 days before running out of your medication, NOTIFY us                 Use [phone] or via MyChart message.                 We can Mail you palliative care prescriptions if you like, they are managed by the  mail order pharmacy                Please let us know 4 business days in advance of running out    Mail order refills are ONLY delivered Monday-Friday (no holidays)     We will need to know the delivery address     Someone will need to be home to sign for the medication (will need to show drivers license and sign for delivery)    2. -Pain medication Agreement                Pain medications to treat cancer-relate pain should  only be prescribed by [facility] unless you are in the ER               We must see you regularly to assess you and safely prescribe               If you miss an appointment or need to cancel/reschedule please call us right away at [phone]  ?   2. Storage                Medicines should be stored in the original bottles in a cool, dry place.                 Please keep them securely out of reach of children and pets.   ?   3. Disposal: Please dispose of your medicines safely. Here is a link to safe disposal sites in [place]: http://trevino.com/.php    ?   4. Precaution: Please keep all medications out of reach of children.    ?   .............................................................................................................................................?   Financial Counselors  [phone] [facility]  [phone] or [phone]     Advance Care Planning   ?   Many patients ask about the differences between an Advance Directive and a POLST.       Here is a Lobbyist that clarifies the differences between these documents:    [email or web address] ?      Differences between an Advance Directive and a POLST are:    An Advance Directive is for:    1. Anyone 57 years of age or older    2. Provides instructions for future treatment    3. Appoints a health care representative    4. Guides inpatient treatment decisions when make available       A POLST is for:    1. Anyone with a serious illness - any age   2. Provides medical orders for current treatment    3. Guides actions by Emergency Medical Personnel when made available    4. Guides inpatient treatment decisions when made available   5. This is a bright pink form that should be placed on your refrigerator    .............................................................................................................................................      Patient Experience Department   ?   The Patient Experience Department acts as a bridge between our patients, hospitals and physicians to respond to concerns, issues and requests. Patient experience specialists are here to help patients and their families communicate their experiences with [facility] and navigate our health system. Please call [phone] or send an email to [email or web address].    ..............................................Marland Kitchen     MyPath     We are excited to share our new [facility] application: MyPath. MyPath will guide you through your unique cancer journey and connect you with our expansive network  of curated support services and resources. It is available in [place] and [place].     You can download the MyPath application in four easy steps:     1.       Use the QR code to open your app store.  2.       Click on the MyPath app icon and install on your device.  3.       Open the MyPath app.  4.       Enter your [facility] username and password to log in.

## 2023-09-04 NOTE — Interdisciplinary (Signed)
Patient arrived ambulatory in stable condition. Denies nausea, vomiting, diarrhea, and constipation.     Right chest port prepped and accessed per sterile protocol with positive blood return. 20G 3/4 inch Huber needle.   Labs drawn: 0820  Biopatch placed: No  Port flushed with 20ml NS and 500 units Heparin and de accessed with needle intact. Dressing applied.   Patient tolerated well and discharged ambulatory in stable condition to  home.

## 2023-09-04 NOTE — Progress Notes (Signed)
[facility] Service Outpatient Progress Note    Requested by Dr. Brock Ra    CC: Pain, psychosocial support    ID: Stephen [name] is a 72 year old male with a history of metastatic urothelial carcinoma, OA, HTN, MDD, gout referred to Palliative Care clinic for symptom management and psychosocial support.    Interval Events:  -Stephen [name] had a follow up with Oncology (Dr. Roseanne Reno) on 08/10/23.  Per review of note, planned to continue treatment with enfortumab and pembrolizumab.  Stephen [name] had an evaluation with Radiation Oncology (Dr. Lodema Hong) on 07/26/23.  Per review of note, planned for XRT single fraction to right shoulder.    Subjective:    Today we met with patient in palliative care clinic.  We addressed several issues during their visit:     #) Pain: See description below.   Location: Generalized body pain, shoulders, arms  Quality: Sharp, aching  Severity:   Current pain score: 7  Best pain score: 10  Duration/Timing: constant  Modifying factors       Aggravating factors: sitting down, activities       Alleviating factors: tizanidine, hydromorphone, pregabalin    -Taking hydromorphone 2mg  1-2 tablets (2mg  to 4mg ) PO q4h PRN, not taking 2 tablet dose, only takes the 1 tablet (2mg ) dose.  Discussed the dosing he is able to take today, he states he will try taking around the clock and will try the 2 tablet (4mg ) dose.  -Does not take Ibuprofen or acetaminophen or diclofenac 1% topical  -Takes duloxetine CR 20mg  PO BID  -Takes pregabalin 100mg  PO 1-2 times a day (unclear) - was prescribed most recently as BID but another provider has taken over prescription and it is now just daily, may be appropriate given decreased creatinine clearance (pt getting labs rechecked today)  -Taking tizanidine 2mg  PO q8h PRN muscle spasms, does not take every day, in past has thought it helped but really not as sure anymore.  -Also has followed with Pain Management  -Denies any adverse effects of the hydromorphone, pregabalin, duloxetine  or tizanidine such as sedation, lethargy, myoclonus or pruritus.  Not currently having falls but in the past has reported issues with frequent falls, which we need to monitor closely    #) Bowel movements:  -Had BM last night  -Has been having significant constipation, got better with Senna yesterday  -Does not like taking Miralax  -Discussed importance of consistently taking his laxatives and stool softeners, patient is unsure what he is taking day to day, points me towards his medication list, but doesn't have it with him today (medication list says Senna 1 tablet PO BID)    #) Nausea:  -Has nausea at times  -Takes prochlorperazine 10mg  PO q6h PRN for nausea, still helpful when he needs it  -Denies nausea/vomiting today    #) Nutrition and dysphagia:  -Drinking renal diet nutritional shakes to help keep calories up, worries about the protein content, wants to make sure he is not drinking too much protein as it could affect his creatinine  -Mainly eats soft foods  -Per review of Speech Therapy note, 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   No changes in this today  -Denies any new falls since last visit, but at last visit was reporting frequently falls, will need to monitor closely    #) Nephrostomy tube:  -Has nephrostomy tube exchange on 08/23/23  -Noted to have pink tinged urine today  -No fevers, chills, abdominal pain, flank pain reported today  -Plans to go to Urology for assistance today    #) Anxiety:  -Still having significant anxiety today, states if  creatinine is not better today he will not be eligible for clinical trial "and I'm done with all this" if that is the case, but clarifies that he would not want hospice and that he still finds certain outpatient appointments helpful  -Takes trazodone 50mg  PO nightly  -Taking Buspar 15mg  PO BID, wants a dose increase of this today  -Stopped taking hydroxyzine, states it didn't help with anxiety  -Has upcoming appointment to reestablish  -Will avoid stronger anxiolytics such as lorazepam given hx of memory problems, frequent falls and hx of possible opioid overdose unless assessed to be indicated per Psychiatry    #) Urothelial carcinoma:  -Since last visit, unclear if eligible for clinical trial, plans to get creatinine rechecked today to see if he is eligible    #) Psychosocial domain:  -Patient's sister currently in [place] for a business trip and also having a lot of stress with family  -Patient's sister helps to manage medications for him  -Using medical transportation to get to appointments  -Walking with a walker, adamantly declines a wheelchair or PT or home evaluation at previous appointments, no change in this today    #) Advance care planning:  -Health care agent: Stephen [name] (sister)  -Has advance directive on file from 2019 which is notarized.      ROS: Symptom Assessment      07/27/2023     5:52 PM 07/20/2023     3:38 PM 07/06/2023    10:14 AM 06/29/2023     9:53 AM 06/26/2023     8:00 AM 06/08/2023    12:01 PM 06/01/2023     1:47 PM   Palliative Care Symptom Assessment   Pain Level     5     Constipation     5     Tiredness     3     Nausea     0     Depression     9     Anxiety     9     Drowsiness     6     Appetite     4     Wellbeing     8     Dyspnea     0     Insomnia     3     ECOG 2 2 2 2  2 2        Additional ROS reviewed in detail and negative:  Review of Systems   Constitutional:  Positive for appetite change, fatigue and unexpected weight change. Negative for chills, diaphoresis and fever.   HENT:    Positive for trouble swallowing. Negative for sore throat and voice change.    Eyes:  Negative for icterus.   Respiratory:  Negative for chest tightness, cough, hemoptysis, shortness of breath and wheezing.    Cardiovascular:  Negative for chest pain, leg swelling and palpitations.  Gastrointestinal:  Positive for constipation and nausea. Negative for abdominal pain, blood in stool, diarrhea and vomiting.   Genitourinary:  Positive for difficulty urinating. Negative for dysuria, frequency and hematuria.         Pink urine   Musculoskeletal:  Positive for arthralgias, back pain, gait problem and neck pain.   Skin:  Negative for rash.   Neurological:  Positive for extremity weakness and gait problem. Negative for light-headedness and speech difficulty.   Psychiatric/Behavioral:  Positive for sleep disturbance. Negative for suicidal ideas. The patient is nervous/anxious.         Outpatient Palliative Regimen:  Acetaminophen 1000mg  PO q8h PRN pain   Duloxetine CR 20mg  PO BID  Prochlorperazine 10mg  PO q6h PRN nausea  Trazodone 50mg  PO nightly   Pregabalin 100mg  PO 1-2 times a day  Miralax 17gm PO BID PRN constipation  Senna-S 1 tablet PO BID   Lidocaine 4% patch 12 hours on / 12 hours off  Diclofenac 1% topical QID PRN pain  Narcan PRN  Ibuprofen 600mg  PRN  Tizanidine 2mg  PO q8h PRN muscle spasms  Loperamide PRN    Conversion to Oral Morphine Equivalent = Unclear    Allergies & Reactions: Review of patient's allergies indicates Sulfa drugs    Medications: (reviewed today)  Current Outpatient Medications on File Prior to Visit   Medication Sig Dispense Refill    acetaminophen (TYLENOL) 500 MG tablet Take 2 tablets (1,000 mg) by mouth every 8 hours as needed for Mild Pain (Pain Score 1-3) or Moderate Pain (Pain Score 4-6). 40 tablet 0    albuterol (PROAIR HFA) 108 (90 Base) MCG/ACT inhaler ProAir HFA 90 mcg/actuation aerosol inhaler      allopurinol (ZYLOPRIM) 100 MG tablet Take 1 tablet (100 mg) by mouth daily. 30 tablet  0    Artificial Tear Solution (SOOTHE XP OP)       busPIRone (BUSPAR) 15 MG tablet Take 1 tablet (15 mg) by mouth 2 times daily. 180 tablet 1    diclofenac (VOLTAREN) 1 % gel Apply 2 g topically every 6 hours as needed (Right knee pain). 100 g 0    DULoxetine (CYMBALTA) 20 MG CR capsule Take 1 capsule (20 mg) by mouth 2 times daily. 180 capsule 2    fluticasone propionate (FLONASE) 50 MCG/ACT nasal spray 2 sprays.      HYDROmorphone (DILAUDID) 2 MG tablet Take 1 to 2 tablets (2mg  to 4mg ) by mouth every 4 hours as needed for moderate to severe pain 60 tablet 0    hydrOXYzine HCL (ATARAX) 25 MG tablet Take 1 tablet (25 mg) by mouth every 8 hours as needed for Anxiety. 30 tablet 2    ibuprofen (MOTRIN) 600 MG tablet       ketotifen (ALAWAY) 0.025 % ophthalmic solution Place 1 drop into both eyes 2 times daily.      loperamide (IMODIUM) 2 MG capsule For mild to moderate diarrhea (less than 5 BMs within 24 hours):  Take 1 capsule (2 mg) by mouth 4 times daily as needed for Diarrhea.  For severe Diarrhea (5 or more medium to large BMs within 24 hours):  take 2 capsules and then 1 capsule every 2 hours until diarrhea stops. 90 capsule 0    loratadine (CLARITIN REDITABS) 10 MG dissolvable tablet       LORazepam (ATIVAN) 0.5 MG tablet Take 1 tablet by mouth 30 to 60 minutes prior to MRI.  May repeat dose once if needed. 2 tablet 0  Multiple Vitamins-Minerals (MULTIVITAMIN WITH MINERALS) TABS tablet Take 1 tablet by mouth daily. 30 tablet 1    naloxone (NARCAN) 4 mg/0.1 mL nasal spray For suspected opioid overdose, call 911! Then spray once in one nostril. Repeat after 3 minutes if no or minimal response using a new spray in other nostril. 2 each 0    Nutritional Supplements (NEPRO) LIQD Take 3 Cans by mouth daily. 30000 mL 11    polyethylene glycol (MIRALAX) 17 g packet Take 1 packet (17 g) by mouth 2 times daily as needed for Constipation. 1 each 0    pregabalin (LYRICA) 100 MG capsule Take 1 capsule (100 mg) by mouth at  bedtime. 30 capsule 0    prochlorperazine (COMPAZINE) 10 MG tablet Take 1 tablet (10 mg) by mouth every 6 hours as needed (Nausea/Vomiting). 30 tablet 5    senna (SENOKOT) 8.6 MG tablet Take 2 tablets (17.2 mg) by mouth 2 times daily. 30 tablet 0    tamsulosin (FLOMAX) 0.4 MG capsule TAKE 1 CAPSULE BY MOUTH EVERY DAY 90 capsule 1    tizanidine (ZANAFLEX) 2 MG tablet Take 1 tablet (2 mg) by mouth every 8 hours as needed (muscle spasm). 30 tablet 2    traZODone (DESYREL) 50 MG tablet Take one tab nightly by mouth at bedtime as needed for insomnia 90 tablet 0     No current facility-administered medications on file prior to visit.       Past Medical History:  Past Medical History:   Diagnosis Date    Chronic back pain     Congenital hydronephrosis     Gout     Headache     Hematuria     HTN (hypertension) 12/10/2021    Kidney disease     Kidney stones     Major depressive disorder, single episode     Polyarthropathy or polyarthritis of multiple sites     Retinal detachment     Urethral stricture        Past Surgical History:  Past Surgical History:   Procedure Laterality Date    CT INSERTION OF SUPRAPUBIC CATH  09/25/2015    NEPHRECTOMY Right 1955    APPENDECTOMY      COLONOSCOPY      CYSTOSCOPY      CYSTOSCOPY W/ LASER LITHOTRIPSY      OTHER SURGICAL HISTORY      Interstim 01/29/2011    SPINE SURGERY  09/21    Lumbar-sacral fusion    TRANSURETHRAL RESECTION OF PROSTATE         Social History:  Healthcare agent/surrogate decision-maker: Stephen [name] (sister)  Social History     Social History Narrative    Not on file        Family History:  Family History   Adopted: Yes   Family history unknown: Yes       PEX:  Vitals:    09/04/23 0904   BP: 122/65   BP Location: Left arm   BP Patient Position: Sitting   BP cuff size: Regular   Pulse: 51   Resp: 16   Temp: 97.5 F (36.4 C)   TempSrc: Temporal   SpO2: 99%   Weight: 74.7 kg (164 lb 11.2 oz)   Height: 5' 6.14" (1.68 m)             General: No acute distress, thin,  sitting in chair  HEENT: PERRLA, EOMI, no conjunctivitis or scleral icterus noted.     Neck:  Trachea midline.  Lungs: Clear to auscultation bilaterally, no wheezes, no rhonchi, no rales  CV: Regular rate and rhythm, no murmurs, no rubs, no gallops  Abdomen: Soft, nondistended, nontender to palpation, no rebound or guarding, normoactive bowel sounds in all four quadrants, no CVAT, +nephrostomy tube noted with slightly pink tinged urine in bag on L side  Neuro: Oriented x3, no dysarthria, moving all four extremities spontaneously, 5/5 strength throughout.  Continues to appear to have memory issues at times (unable to tell me how often and when he is taking certain medications once again today) although seems able to recall most things  Psych: Thought process linear and goal-directed, thought content appropriate, affect full range and appropriate, denies suicidal ideation, speech is pressured at times and he is reporting significant anxiety today.  No evidence of hallucinations or delusions  Skin: Warm and well perfused.  Brisk capillary refill.      Pertinent Labs:     Lab Results   Component Value Date    WBC 7.0 09/04/2023    RBC 3.88 (L) 09/04/2023    HGB 11.3 (L) 09/04/2023    HCT 35.4 (L) 09/04/2023    MCV 91.2 09/04/2023    MCHC 31.9 (L) 09/04/2023    RDW 15.9 (H) 09/04/2023    PLT 211 09/04/2023    MPV 10.1 09/04/2023       Lab Results   Component Value Date    BUN 31 (H) 09/04/2023    CREAT 1.33 (H) 09/04/2023    CL 101 09/04/2023    NA 139 09/04/2023    K 4.1 09/04/2023    [place] 9.8 09/04/2023    TBILI 0.54 09/04/2023    ALB 4.2 09/04/2023    TP 7.1 09/04/2023    AST 56 (H) 09/04/2023    ALK 199 (H) 09/04/2023    BICARB 28 09/04/2023    ALT 65 (H) 09/04/2023    GLU 129 (H) 09/04/2023       Above labs reviewed    Pertinent Diagnostic Study Findings:  CT Chest with contrast 08/22/23  IMPRESSION:  Findings of increased trending multi compartmental metastatic disease of the thorax.     Increased size of scattered  pulmonary nodules suspicious for increased trending pulmonary metastatic disease.     Stable to increase in size of multi station thoracic adenopathy worrisome for increased nodal disease.     Increased size and distribution of osseous metastatic bone disease from prior study.     New focal ground-glass in the right upper lobe is most likely infectious or inflammatory in nature. Attention on follow-up imaging recommended.     Additional ancillary findings as above. Please refer to concurrent CT abdomen/pelvis for description of findings below the diaphragm.    CT Urogram 08/22/23  IMPRESSION:  Stable osseous metastases when compared to the prior examination. No evidence of disease progression or new metastatic disease.     The bladder is decompressed and unable to be evaluated. If there is further concern, consider direct visualization.     Colon containing left inguinal hernia without evidence obstruction.    MRI Thoracic Spine 08/19/23  IMPRESSION:  Diffuse metastatic disease of the thoracic spine corresponding to sclerotic lesions on CT, some with associated mild epidural enhancement as above, however without significant spinal canal stenosis, compression of the cord, or abnormal cord signal.     No evidence of pathologic fracture.    Above findings reviewed    ASSESSMENT: Kealan Dintino is a 72 year old male with  a history of metastatic urothelial carcinoma, OA, HTN, MDD, gout referred to Palliative Care clinic for symptom management and psychosocial support.    Medical chart reviewed in detail.  Above labs reviewed and notable for anemia, elevated creatinine (Cr 1.54), elevated alkaline phosphatase   Above imaging reviewed personally and notable for left multi-compartmental metastatic disease of the thorax, increased size of scattered pulmonary nodules, stable osseous metastases, left inguinal hernia with no obstruction, diffuse metastatic disease of the thoracic spine      Diagnoses and all orders for this  visit:    Metastatic urothelial carcinoma (CMS-HCC)    Cancer related pain    Pain from bone metastases (CMS-HCC)    Cervical radiculopathy    Cervical spondylosis without myelopathy    Therapeutic opioid induced constipation    Fatigue, unspecified type    Insomnia, unspecified type    At risk for nausea    Anxiety    Memory problem    History of fall    Palliative care by specialist                  PLAN:  - continue hydromorphone to 2mg  2 tablets (4mg ) PO q4h PRN severe pain.  Patient has a history of opioid overdose, recommend avoiding long acting opioids at this time.  If pain not relieved with taking hydromorphone around the clock at this dose, may consider Fentanyl patch if patient's sister willing to help him manage it safely.  Patient counseled to take as prescribed (currently he is not taking very often and not taking prescribed dose, taking much less)  - continue acetaminophen 1000mg  PO q8h PRN pain  - continue duloxetine CR 20mg  PO BID to target pain and anxiety, monitor carefully for signs/symptoms of serotonin syndrome  - continue pregabalin 100mg  PO daily, monitor renal function closely.    - continue tizanidine at dose of 2mg  PO q8h PRN muscle spasms.    - have discontinued hydroxyzine, holding this right now as patient is at high risk of falls and he also did not feel it was helpful  - increase Buspar to 15mg  PO TID  - CURES reviewed  - Narcan PRN prescribed at previous visit and we discussed how/when to use in emergencies.  Confirmed that patient has this at home.  - continue Senna-S 1 tablet PO BID (encouraged Stephen [name] to make sure to take this)  - continue Miralax 17gm PO BID PRN constipation  - continue prochlorperazine 10mg  PO q6h PRN nausea  - continue trazodone 50mg   PO nightly PRN insomnia.   - continue follow up with Pain Management  - Continue Nutrition follow up  - Continue follow up with PT, Speech Therapy  - Pending Psychiatry follow up  - Referred to Memory Clinic to address chronic memory  issues but noted there is no appointment today.  Reminded him about this again today, may reach out to his sister  - Patient adamantly declines IHSS or home based palliative care or physical therapy.  Declines wheelchair.  - please mychart message or call the palliative care clinic regarding symptom management    Follow up in palliative care clinic on 10/09/23 at 9 AM in person at [facility] (60 minutes)    Patient evaluated with the following [facility] Service Members:  Georjean Mode, LVN    Thank you for your consult.  We will continue to follow this patient and provide longitudinal care, symptom management and psychosocial support.    Shea Markeesha Char, MD  Assistant Professor  [employer] Palliative [facility] Service  [email or web address]    Total Attending time 65 min:  65 min spent performing physical examination, counseling patient, and coordinating their care with medical team.

## 2023-09-04 NOTE — Progress Notes (Signed)
[facility] OUTPATIENT PAVILION / Medical Oncology Social Work Note    Initial information/reason for referral:  Stephen [name] is in treatment for bladder cancer with Dr. Roseanne Reno.  He also sees Dr. Logan Bores of the [facility].    Today, Stephen [name] saw Dr. Logan Bores for in-person visit at 9 am.  He also went to Urology clinic at [facility] and had his nephrostomy bag changed.    Stephen [name] self referred to social work for ongoing psychosocial support and resource referrals.  Stephen [name] and I met in the [employer] at [facility] for ~ 20 minutes.      Interview data:  He is very concerned about his labs and his creatinine levels, which may disqualify him for the clinical trail that he has much hope for treating his cancer.  Stephen [name] is hyper-focused on the lab results.  He shares if labs are not improved, then he will "cry and be depressed... that is the end of me (if he can't participate in trial)".  He is understandably concerned and frustrated, and he's done everything possible to treat his cancer.      Stephen [name] also shares that living with his sister and niece is very helpful b/c he needs some help at home, and they share cost of living expenses.  He notes "my niece probably wants me out of there", and their relationship has been strained in the past "we (now) tolerate each other".      Stephen [name] does not have the financial resource nor the energy to seek for housing and live on his own.  He has insight into this.  He assists around the house with chores, gardening, etc, but currently does less d/t his weakened condition.  It is my impression that he is overestimating his abilities, as he appears thin, frail, and fatigued, uses a walker for mobility.  However, Stephen [name] is independent and self reliant, expresses sincere desire to do as much on his own as possible, e.g. arrange transportation through [place] for appts., of which he is proud.  It also seems that having responsibilities and contributing at home are a motivating factor for Stephen [name] to feel relevant  and useful.      Stephen [name] thanked me for my time and the intervention today.    Intervention:  Provided empathic listening and supportive counseling, anticipatory guidance.  Collaborated with medical team.  Promoted positive coping strengths/skills and self-care.  Focused on client strengths. Provided referrals (see below).     Referrals:  Provided $25 gift card from [employer].    Discussed IHSS, which he may qualify for, to provide homemaker/help at home, as Stephen [name] states he has increasing weakness and fatigue that precludes him from being as active in the past with contributing to the household e.g. housekeeping, laundry, gardening etc.  But he maintains he can do this on his own and declined a referral to IHSS.  I asked a couple of times.  He also has a Solicitor, and I told him he could talk to them when/if he is ever interested.    Stephen [name] qualifies for a renewed Cancer Care yearly grant for bladder cancer ($300).  His anniversary is on 10/04/23.  Stephen [name] knows that I will call him back in a couple weeks to start the grant application.    Plan:  In person consult with Dr. Roseanne Reno and Wynelle Link.    Updated Dr. Logan Bores via epic message.    Will follow.    Kingsley Plan, LCSW  [employer]  [facility] Social Worker  [phone]

## 2023-09-04 NOTE — Progress Notes (Unsigned)
[facility] Service Palliative Care Note     Reason for visit: Follow-up  Pain management      Diagnosis: MN of urinary bladder     Advanced Care Planning: On File  Surrogate Decision Maker:  Colman Cater, sister  [phone]    Assessment:     Patient Orientation: A&O x 4, answers questions readily and appropriately with recent and remote memories intact    CC:  - high creatinine and concern not able to do the trial medication  - anxious and would like to discuss if the buspar can be increased.  He has an appointment on 09/21/23 new psych consult.     General:   - he does not know that he can take dilaudid 2 mg 1 to 2 tabs every 4 hours PRN.    - patient medication teaching provided.   - he does not take ibuprofen and tylenol     Pain Assessment:    Location: generalized body pain   Quality: dull pain   Severity:      Current pain score 1-10:  7      Worst pain score 1-10: 10       Best daily pain score 1-10: did not provided  Duration/Timing: "I don't know"  Modifying factors       Aggravating factors: pain comes on        Alleviating factors: rest     Pain Medication:   1) dilaudid 2 mg take 1 to 2 tabs every 4 hours PRN  - he is taking dilaudid occasionally 1 tablet  - he took it today  2) cymbalta 20 mg bid     Nausea/vomiting:     Denies N/V    Medication:   1) compazine 10 mg as ordered    Appetite:   - he takes Nephro supplemental drink   - he makes effort to eat.  He needs some dental work and he will follow-up with his dentist      GI:  Bowel Movements:   - Consistency:  constipation      - had a large bm yesterday   - taking senna as needed    Medication:   1) senna 8.6 mg as ordered   2) glycolax 17gm/scoop as ordered    Anxiety/depression:   - expressed feeling nervous related to elevated creatinine and might not have the chance to take the trial medication.     Medication:   - buspar 15 mg bid.     Sleep/Fatigue   - awake every 3 to 4 hours to manage the nephrostomy  - he takes nap.     Medication:   1)  lyrica 100 mg at hs  2) trazadone 50 mg  at HS    Financial challenges: none      Well Being Assessment:   -Patient's needs currently being addressed.  Patient's affect/mood appropriate for situation.     Education: Chief Operating Officer and verbal instruction provided regarding plan of care.   Barriers to learning assessed: No barriers noted. Patient verbalized understanding of teaching and instructions.     Contact information, AVS given and reviewed related to plan of care. Questions answered to patient's satisfaction and verbalized understanding. Patient/family aware to call with any questions or concerns that may arise.    Georjean Mode, LVN

## 2023-09-04 NOTE — Interdisciplinary (Signed)
Pt walked in requesting change of nephrostomy bag. Exit site with no signs of infection. Old bag removed, new bag applied. Provided patient with 1 extra bag per request.

## 2023-09-05 ENCOUNTER — Other Ambulatory Visit: Payer: Self-pay

## 2023-09-05 MED ORDER — ERDAFITINIB 4 MG PO TABS
8.0000 mg | ORAL_TABLET | Freq: Every day | ORAL | 3 refills | Status: DC
Start: 2023-09-05 — End: 2023-12-07
  Filled 2023-09-05: qty 56, 28d supply, fill #0
  Filled 2023-10-09: qty 56, 28d supply, fill #1
  Filled 2023-11-10: qty 56, 28d supply, fill #2
  Filled 2023-12-05: qty 56, 28d supply, fill #3

## 2023-09-05 NOTE — Progress Notes (Addendum)
New start [facility]

## 2023-09-06 ENCOUNTER — Telehealth (HOSPITAL_BASED_OUTPATIENT_CLINIC_OR_DEPARTMENT_OTHER): Payer: Self-pay | Admitting: Orthopaedic Surgery of the Spine

## 2023-09-06 ENCOUNTER — Other Ambulatory Visit: Payer: Self-pay

## 2023-09-06 NOTE — Telephone Encounter (Signed)
The Department of Orthopaedics is reaching out to you, in regard to your appointment on 09/18/2023.  Dr. Christa See is out of office and your appointment has been changed to 09/29/2023 at 1:20 pm - [facility] -ortho: [address]nd floor [place], [place] [address] with Dr. Christa See.     We are sorry for any inconvenience this may cause.      Please call the office at [phone] for any concerns or questions.      Thank you from Orthopaedics Department.

## 2023-09-07 ENCOUNTER — Ambulatory Visit (HOSPITAL_BASED_OUTPATIENT_CLINIC_OR_DEPARTMENT_OTHER): Payer: Medicare Other

## 2023-09-07 ENCOUNTER — Telehealth (HOSPITAL_BASED_OUTPATIENT_CLINIC_OR_DEPARTMENT_OTHER): Payer: Self-pay | Admitting: Hematology & Oncology

## 2023-09-07 ENCOUNTER — Encounter (HOSPITAL_BASED_OUTPATIENT_CLINIC_OR_DEPARTMENT_OTHER): Payer: Self-pay | Admitting: Hematology & Oncology

## 2023-09-07 ENCOUNTER — Other Ambulatory Visit: Payer: Self-pay

## 2023-09-07 DIAGNOSIS — C7910 Secondary malignant neoplasm of unspecified urinary organs: Secondary | ICD-10-CM

## 2023-09-07 NOTE — Telephone Encounter (Signed)
Caller: pt  Relationship to patient: self  Phone # [phone]  Provider: Roseanne Reno  Notes: Pt requesting Dr. Roseanne Reno to schedule him for lab draw tomorrow at 9 am. At [facility]. Pt states they couldn't schedule him when he called    Please assist    Caller has been advised of 48 hr turnaround time.

## 2023-09-07 NOTE — Telephone Encounter (Signed)
Pt sent [facility] with request.   Order placed.   Pt notified via [facility].   Request sent to infusion to schedule pt for FT for lab draw on 9/20 at 9am.

## 2023-09-07 NOTE — Telephone Encounter (Signed)
VM received from pt asking for order for CMP to be drawn on 9/20.     Pt has port, will need FT visit. Pt requests visit at 0900 as has ride available at that time.

## 2023-09-08 ENCOUNTER — Other Ambulatory Visit (INDEPENDENT_AMBULATORY_CARE_PROVIDER_SITE_OTHER): Payer: Medicare Other | Attending: Hematology & Oncology

## 2023-09-08 ENCOUNTER — Encounter (HOSPITAL_BASED_OUTPATIENT_CLINIC_OR_DEPARTMENT_OTHER): Payer: Self-pay | Admitting: Hematology & Oncology

## 2023-09-08 ENCOUNTER — Other Ambulatory Visit: Payer: Self-pay

## 2023-09-08 ENCOUNTER — Ambulatory Visit (HOSPITAL_BASED_OUTPATIENT_CLINIC_OR_DEPARTMENT_OTHER): Payer: Medicare Other

## 2023-09-08 DIAGNOSIS — C7910 Secondary malignant neoplasm of unspecified urinary organs: Secondary | ICD-10-CM | POA: Insufficient documentation

## 2023-09-08 DIAGNOSIS — C662 Malignant neoplasm of left ureter: Secondary | ICD-10-CM

## 2023-09-08 LAB — COMPREHENSIVE METABOLIC PANEL, BLOOD
ALT (SGPT): 138 U/L — ABNORMAL HIGH (ref 0–41)
AST (SGOT): 95 U/L — ABNORMAL HIGH (ref 0–40)
Albumin: 4.4 g/dL (ref 3.5–5.2)
Alkaline Phos: 230 U/L — ABNORMAL HIGH (ref 40–129)
Anion Gap: 14 mmol/L (ref 7–15)
BUN: 29 mg/dL — ABNORMAL HIGH (ref 8–23)
Bicarbonate: 30 mmol/L — ABNORMAL HIGH (ref 22–29)
Bilirubin, Tot: 0.76 mg/dL (ref <1.2)
Calcium: 9.9 mg/dL (ref 8.5–10.6)
Chloride: 97 mmol/L — ABNORMAL LOW (ref 98–107)
Creatinine: 1.38 mg/dL — ABNORMAL HIGH (ref 0.67–1.17)
Glucose: 97 mg/dL (ref 70–99)
Potassium: 4.3 mmol/L (ref 3.5–5.1)
Sodium: 141 mmol/L (ref 136–145)
Total Protein: 7.8 g/dL (ref 6.0–8.0)
eGFR Based on CKD-EPI 2021 Equation: 55 mL/min/{1.73_m2}

## 2023-09-08 NOTE — Interdisciplinary (Signed)
IRB [phone]  [id number]  SID: 04-003  CRC Note    Re: Screen Failure    This morning, I met with Mr. Stephen [name] to discuss the possibility of re-enrolling in the study under a different SID. I explained that if he chooses to re-enroll, he will need to undergo all screening procedures again due to the strict protocol requirements.    Mr. Stephen [name] agreed to sign the consent form and retest his creatinine levels, as there had been a downward trend in his recent values.    After signing the informed consent earlier today, Mr. Stephen [name] presented to the walk-in lab at [facility] to complete the necessary screening labs.    Unfortunately, his creatinine level was outside the eligibility range. I notified Dr. Roseanne Reno, who confirmed that Mr. Stephen [name] would be considered a screen failure and will not be retested for study eligibility.        Ruben Gottron  Clinical Research Coordinator  [phone]

## 2023-09-08 NOTE — Interdisciplinary (Signed)
IRB: [id number]  [id number]  SID: 04-002  CRC Note    Re: Screen Failure    Patient presented on-site to repeat CMP blood draw due to prior elevated creatinine level resulted on 08/24/2023. Creatinine levels drawn today unfortunately were still out of range for study eligibility, resulting in screen failure. Patient was notified of this result and was told that he may re-enroll in the study under a new subject ID number if he is willing to repeat screening procedures. Patient understood and had no further questions at this time.       Ruben Gottron  Clinical Research Coordinator  [phone]

## 2023-09-08 NOTE — Interdisciplinary (Addendum)
CRC Note     Sponsor / Study title: [employer]. / "A Phase 1, Open-Label Study of [id number] to Assess Safety, Tolerability, and Pharmacokinetics in Patients with Advanced Solid Tumors"     IRB: [phone]  Re: Initial Informed Consent CRC Narrative     I met with Mr. Stephen [name] in-person on 08-Sep-2023, to discuss the [employer] [id number] (IRB [phone]) Informed Consent Form.   Mr. Stephen [name] previously screen failed for this study on 29-Aug-2023, however was willing to consent to this study under a new subject ID. It was explained to him that we will need to repeat all screening procedures if he decides to move forward.      I thoroughly reviewed the informed consent form with the patient, including the purpose of the study, the voluntary nature of the study, and the extent of confidentiality. Dr. Brock Ra reviewed the risks and benefits of participation in this clinical trial and presented to the subject all other alternative types of therapies during a clinic visit on 10-Aug-2023.      The patient states he is no longer sexually active. Reproductive risks were reviewed with the patient and he was informed of adequate contraceptive measures if he were to consider sexual activity during his participation in this study.      In addition to the Informed Consent Form, we reviewed the Annette Stable of Rights and HIPAA forms. Per Dr. Marca Ancona evaluation, the patient has decision-making capabilities and the information was provided in a language understandable to him. He was given ample time to review the consent and ask any questions. All of his questions were answered to apparent satisfaction. Neither the investigator nor the research staff have coerced or influenced the patient to participate in the trial.      Stephen [name] agreed to re-enroll in the dose escalation part of the study and signed the consent ([employer] IRB Approved Version 17 Mar 2023) and HIPAA form on 08-Sep-2023, prior  to any study related procedures. As the person administering consent, I signed the informed consent form on 08-Sep-2023.     The original signed forms will remain in the clinical research chart. Copies of the signed consent, signed HIPAA, and bill of rights were given to the patient and also sent to medical records to be uploaded in Epic (EMR). The patient was also provided with my contact information.      Ruben Gottron  Clinical Research Coordinator  [phone]

## 2023-09-09 ENCOUNTER — Other Ambulatory Visit (HOSPITAL_BASED_OUTPATIENT_CLINIC_OR_DEPARTMENT_OTHER): Payer: Self-pay | Admitting: Family Medicine

## 2023-09-09 DIAGNOSIS — G893 Neoplasm related pain (acute) (chronic): Secondary | ICD-10-CM

## 2023-09-09 DIAGNOSIS — C7951 Secondary malignant neoplasm of bone: Secondary | ICD-10-CM

## 2023-09-10 ENCOUNTER — Encounter (HOSPITAL_BASED_OUTPATIENT_CLINIC_OR_DEPARTMENT_OTHER): Payer: Self-pay | Admitting: Family Medicine

## 2023-09-10 DIAGNOSIS — G893 Neoplasm related pain (acute) (chronic): Secondary | ICD-10-CM

## 2023-09-10 DIAGNOSIS — C7951 Secondary malignant neoplasm of bone: Secondary | ICD-10-CM

## 2023-09-11 ENCOUNTER — Encounter: Payer: Self-pay | Admitting: Family Medicine

## 2023-09-11 ENCOUNTER — Other Ambulatory Visit: Payer: Self-pay

## 2023-09-11 ENCOUNTER — Encounter (HOSPITAL_BASED_OUTPATIENT_CLINIC_OR_DEPARTMENT_OTHER): Payer: Self-pay | Admitting: Hematology & Oncology

## 2023-09-11 ENCOUNTER — Ambulatory Visit (HOSPITAL_BASED_OUTPATIENT_CLINIC_OR_DEPARTMENT_OTHER): Payer: Medicare Other

## 2023-09-11 DIAGNOSIS — C7910 Secondary malignant neoplasm of unspecified urinary organs: Secondary | ICD-10-CM

## 2023-09-11 MED ORDER — HYDROMORPHONE HCL 2 MG OR TABS
ORAL_TABLET | ORAL | 0 refills | Status: DC
Start: 2023-09-11 — End: 2023-09-18

## 2023-09-11 NOTE — Telephone Encounter (Signed)
[name] Service Progress Note    Received refill request for hydromorphone 2mg  1-2 tablets (2mg  to 4mg ) PO q4h PRN pai.  Patient's last appointment with our team was on 09/04/23 and patient's next appointment with our team is on 10/09/23.  Patient has Narcan PRN on file.  Reviewed CURES, per CURES, last picked up hydromorphone 2mg  #60 on 08/29/23, pregabalin 100mg  #30 on  08/29/23, hydromorphone 2mg  #60 on 08/11/23.  No aberrancies noted.  Will send refill as requested.    Shea Sylwia Cuervo, MD  Assistant Professor  [facility] Palliative Care Service  [email or web address]

## 2023-09-11 NOTE — Telephone Encounter (Signed)
From: [name]  To: Margarita Mail, MD  Sent: 09/10/2023 4:37 PM PDT  Subject: dilaudid    I let myself run out UY#[id number]

## 2023-09-11 NOTE — Telephone Encounter (Signed)
Refill request    Dilaudid 2 mg #60 take 1-2 q 4 hours prn pain, last fill 9/10  Patient states he ran out    Last visit 9/16 Dr. Logan Bores  Next visit 10/21 Dr. Logan Bores

## 2023-09-12 ENCOUNTER — Other Ambulatory Visit: Payer: Self-pay

## 2023-09-12 NOTE — Telephone Encounter (Signed)
P/C to sister Rinaldo Cloud to advise rx for hydromorphone sent and to check in on [place].    No answer left vm with palliative care phone number

## 2023-09-12 NOTE — Addendum Note (Signed)
Addended by: Richardson Dopp on: 09/12/2023 03:48 PM     Modules accepted: Orders

## 2023-09-12 NOTE — Telephone Encounter (Signed)
Pt calling back stating he is unable to get a ride for before 10/3 to get labs drawn.    Pt will come in earlier on 10/3 around 12:30 for lab draw.  Labs changed to stat priority.

## 2023-09-12 NOTE — Telephone Encounter (Signed)
Erdafitinib APPROVED.  Per Pharmacy, pt should receive this by 10/1.    Plan per Dr. Roseanne Reno:  -Start Erdafitinib as soon as you get this.  Take 8 mg (2 tabs) daily  -Labs (CBC, CMP, Phos) before 10/3  -Return to see Dr. Roseanne Reno on 10/3 at 2:30 pm    Pt VU of the above plan and thanked RN for call.

## 2023-09-13 ENCOUNTER — Other Ambulatory Visit: Payer: Self-pay

## 2023-09-13 MED ORDER — NICOTINE 14 MG/24HR TD PT24: 14.0000 mg | MEDICATED_PATCH | Freq: Every day | TRANSDERMAL | 0 refills | Status: DC

## 2023-09-13 NOTE — Telephone Encounter (Signed)
Voicemail: unable to hear message, extracted phone number from message details     RN called pt to assess needs. No answer left voicemail.

## 2023-09-15 ENCOUNTER — Other Ambulatory Visit: Payer: Self-pay | Admitting: Pharmacist

## 2023-09-15 ENCOUNTER — Other Ambulatory Visit: Payer: Self-pay

## 2023-09-15 NOTE — Progress Notes (Signed)
Specialty Pharmacy - Oral Oncology     Subjective   Stephen [name] is a 72 year old male, who is followed by the specialty pharmacy service for [facility].    Start Date:  09/19/23    Indication:  Urothelial carcinoma  Treatment Plan:   Schedule: Balversa 4mg  #56, 2 tab po daily (see mychart 09/11/23)    Medication Adherence    Demonstrates understanding of importance of adherence: yes  Informant: patient  Reliability of informant: reliable  Provider-estimated medication adherence level: 90-100%  Reasons for non-adherence: no problems identified  Adherence tools used: calendar  Support network for adherence: healthcare provider  Confirmed plan for next specialty medication refill: delivery by pharmacy  Refills needed for supportive medications: not needed       Adverse Effects    Adverse events reported: No  *All other systems reviewed and are negative       Medication Review  Medication review was performed: yes    Drug Interactions    Drug Interactions Evaluated: yes  Clinically Relevant Drug Interactions Identified: yes   Interactions list: Loperamide and Erdafitinib  Loperamide may have side effects increased by Erdafitinib    Monitor for loperamide-associated adverse reactions, such as CNS effects and cardiac toxicities (i.e., syncope, ventricular tachycardia, QT prolongation, torsade de pointes, cardiac arrest), if coadministered with erdafitinib. Concurrent use may increase loperamide exposure. Loperamide is a P-gp substrate and erdafitinib is a P-gp inhibitor. Coadministration with another P-gp inhibitor increased loperamide plasma concentrations by 2- to 3-fold.   Provided the patient with educational material regarding drug interactions: not applicable  Food Interactions Evaluated: no       Objective   Lab Results   Component Value Date    MG 2.0 08/24/2023    NA 141 09/08/2023    K 4.3 09/08/2023    CL 97 (L) 09/08/2023    BICARB 30 (H) 09/08/2023    BUN 29 (H) 09/08/2023    CREAT 1.38 (H) 09/08/2023     GLU 97 09/08/2023    [place] 9.9 09/08/2023    CPK 650 (H) 05/03/2023    GFRNON 54 10/01/2021    EGFRCKDEPI 55 09/08/2023     Lab Results   Component Value Date    WBC 7.0 09/04/2023    HGB 11.3 (L) 09/04/2023    HCT 35.4 (L) 09/04/2023    PLT 211 09/04/2023    LYMPHS 30.9 09/04/2023    ABSNEUTRO 3.8 09/04/2023    IANC 3.3 07/27/2023     Lab Results   Component Value Date    AST 95 (H) 09/08/2023    ALT 138 (H) 09/08/2023    ALK 230 (H) 09/08/2023    TBILI 0.76 09/08/2023    DBILI <0.2 08/24/2023    TP 7.8 09/08/2023    ALB 4.4 09/08/2023     Assessment & Plan   Indication, effectiveness, safety and convenience of his specialty medication(s) were reviewed today. Based on the information reviewed, therapy is appropriate at this time    Patient Counseling    Counseled the Patient on the Following: doses and administration discussed, safe handling, storage, and disposal discussed, possible adverse effects and management discussed, possible drug and prescription drug interactions discussed, possible drug and OTC drug and food interactions discussed, lab monitoring and follow-up discussed, use of contraception discussed, therapeutic rationale discussed, cost of medications and cost implications discussed, adherence and missed doses discussed, pharmacy contact information discussed, provided monograph and education materials  Erdatifinib:  - Swallow tablets whole, with or without food.  - If vomiting occurs, do not replace the dose; the next dose should be taken the next day.-  - If a dose is missed, it can be taken as soon as possible on the same day; do not take extra tablets to make up for the missed dose. Resume the regular daily schedule on the next day  - ADRs (>20%): (?20%): phosphate increased, stomatitis, fatigue, creatinine increased, diarrhea, dry  mouth, onycholysis, alanine aminotransferase increased, alkaline phosphatase  increased, sodium decreased, decreased appetite, albumin decreased,  dysgeusia,  hemoglobin decreased, dry skin, aspartate aminotransferase increased,  magnesium decreased, dry eye, alopecia, palmar-plantar erythrodysesthesia  syndrome, constipation, phosphate decreased, abdominal pain, calcium increased,  nausea, and musculoskeletal pain  - Warnings: central serous retinopathy/retinal  pigment epithelial detachment, fetal toxicity    Follow-up: 1 months    Ilsa Iha, [facility]  Specialty Pharmacist

## 2023-09-18 ENCOUNTER — Encounter (HOSPITAL_BASED_OUTPATIENT_CLINIC_OR_DEPARTMENT_OTHER): Payer: Self-pay | Admitting: Family Medicine

## 2023-09-18 ENCOUNTER — Ambulatory Visit (HOSPITAL_BASED_OUTPATIENT_CLINIC_OR_DEPARTMENT_OTHER): Payer: Medicare Other | Admitting: Orthopaedic Surgery of the Spine

## 2023-09-18 ENCOUNTER — Encounter (HOSPITAL_BASED_OUTPATIENT_CLINIC_OR_DEPARTMENT_OTHER): Payer: Self-pay | Admitting: Hematology & Oncology

## 2023-09-18 ENCOUNTER — Other Ambulatory Visit: Payer: Self-pay

## 2023-09-18 DIAGNOSIS — G893 Neoplasm related pain (acute) (chronic): Secondary | ICD-10-CM

## 2023-09-18 DIAGNOSIS — C7951 Secondary malignant neoplasm of bone: Secondary | ICD-10-CM

## 2023-09-18 DIAGNOSIS — M109 Gout, unspecified: Secondary | ICD-10-CM

## 2023-09-18 MED ORDER — HYDROMORPHONE HCL 2 MG OR TABS
ORAL_TABLET | ORAL | 0 refills | Status: DC
Start: 2023-09-18 — End: 2023-09-28

## 2023-09-18 NOTE — Telephone Encounter (Signed)
From: [name]  To: Margarita Mail, MD  Sent: 09/18/2023 7:56 AM PDT  Subject: Medication renewal     please renew my Dilaudid and Allopurinol / thank you/ [id number]

## 2023-09-18 NOTE — Telephone Encounter (Signed)
Palliative Care NP Note    LVN note reviewed.   Pt requesting refill for Dilaudid 2mg  and Allopurinol.   Last Filled: 9/23 #60  CURES reviewed today. No aberrant activity.  Rx for Dilaudid 2mg  #60 sent to [facility].   Rx for Allopurinol should come from PCP.     Stephen Pique, NP-C  [facility] Palliative Care Service  T: [phone]  [email or web address]

## 2023-09-18 NOTE — Telephone Encounter (Signed)
From: [name]  To: Brock Ra, MD  Sent: 09/18/2023 9:27 AM PDT  Subject: lab tests     can we also get a UA with these labs and if my creatinine is 1.2 or less can I get back on the clinical trial? [id number]

## 2023-09-19 ENCOUNTER — Encounter (HOSPITAL_BASED_OUTPATIENT_CLINIC_OR_DEPARTMENT_OTHER): Payer: Self-pay | Admitting: Family Medicine

## 2023-09-19 ENCOUNTER — Other Ambulatory Visit: Payer: Self-pay

## 2023-09-19 ENCOUNTER — Ambulatory Visit
Admission: RE | Admit: 2023-09-19 | Discharge: 2023-09-19 | Disposition: A | Discharge status: Home / Self Care | Payer: Medicare Other | Attending: Radiation Oncology | Admitting: Radiation Oncology

## 2023-09-19 DIAGNOSIS — C662 Malignant neoplasm of left ureter: Secondary | ICD-10-CM | POA: Insufficient documentation

## 2023-09-19 DIAGNOSIS — C7951 Secondary malignant neoplasm of bone: Secondary | ICD-10-CM | POA: Insufficient documentation

## 2023-09-19 NOTE — Telephone Encounter (Signed)
From: [name]  To: Margarita Mail, MD  Sent: 09/19/2023 4:20 PM PDT  Subject: medication request    thank you for renewing my Dilaudid going to start chemo pill in the morning going to see you soon for my appointment [id number]

## 2023-09-20 ENCOUNTER — Other Ambulatory Visit: Payer: Self-pay | Admitting: Pharmacist

## 2023-09-20 ENCOUNTER — Other Ambulatory Visit: Payer: Self-pay

## 2023-09-20 NOTE — Progress Notes (Signed)
Specialty Pharmacy Prior Auth Note    Patient Name: Stephen [name] DOB: Jun 11, 1951    Medication:  Mervyn Gay 4MG  tablets                                        Prescription receipt date: Sep 07, 2023    Prescription insurance company: [employer]    Prior authorization required? YES   Off label indication?yes    Prior authorization status:Prior authorization number: (Key: [id number])  PA Case ID #: [id number]       Prior authorization expiration date: denied    Patient contact summary:     Filling pharmacy: [facility]    Pharmacy phone: [phone]        Rochele Raring  Prior Authorization Technician  [facility]    September 20, 2023 2:21 PM

## 2023-09-21 ENCOUNTER — Other Ambulatory Visit (INDEPENDENT_AMBULATORY_CARE_PROVIDER_SITE_OTHER): Payer: Medicare Other

## 2023-09-21 ENCOUNTER — Encounter (HOSPITAL_BASED_OUTPATIENT_CLINIC_OR_DEPARTMENT_OTHER): Payer: Self-pay | Admitting: Hematology & Oncology

## 2023-09-21 ENCOUNTER — Ambulatory Visit: Payer: Medicare Other | Attending: Hematology & Oncology | Admitting: Hematology & Oncology

## 2023-09-21 VITALS — BP 128/74 | HR 55 | Temp 97.9°F | Resp 16 | Ht 66.14 in | Wt 167.4 lb

## 2023-09-21 DIAGNOSIS — C662 Malignant neoplasm of left ureter: Secondary | ICD-10-CM

## 2023-09-21 DIAGNOSIS — N390 Urinary tract infection, site not specified: Secondary | ICD-10-CM | POA: Insufficient documentation

## 2023-09-21 DIAGNOSIS — R319 Hematuria, unspecified: Secondary | ICD-10-CM | POA: Insufficient documentation

## 2023-09-21 DIAGNOSIS — C7910 Secondary malignant neoplasm of unspecified urinary organs: Secondary | ICD-10-CM | POA: Insufficient documentation

## 2023-09-21 LAB — COMPREHENSIVE METABOLIC PANEL, BLOOD
ALT (SGPT): 389 U/L — ABNORMAL HIGH (ref 0–41)
AST (SGOT): 261 U/L — ABNORMAL HIGH (ref 0–40)
Albumin: 4.2 g/dL (ref 3.5–5.2)
Alkaline Phos: 232 U/L — ABNORMAL HIGH (ref 40–129)
Anion Gap: 12 mmol/L (ref 7–15)
BUN: 31 mg/dL — ABNORMAL HIGH (ref 8–23)
Bicarbonate: 29 mmol/L (ref 22–29)
Bilirubin, Tot: 0.85 mg/dL (ref <1.2)
Calcium: 9.7 mg/dL (ref 8.5–10.6)
Chloride: 98 mmol/L (ref 98–107)
Creatinine: 1.31 mg/dL — ABNORMAL HIGH (ref 0.67–1.17)
Glucose: 98 mg/dL (ref 70–99)
Potassium: 4.1 mmol/L (ref 3.5–5.1)
Sodium: 139 mmol/L (ref 136–145)
Total Protein: 7.3 g/dL (ref 6.0–8.0)
eGFR Based on CKD-EPI 2021 Equation: 59 mL/min/{1.73_m2}

## 2023-09-21 LAB — THROMBIN TIME, BLOOD: Thrombin Time: 14.5 s (ref 12.8–16.2)

## 2023-09-21 LAB — CBC WITH DIFF, BLOOD
ANC-Automated: 3.6 10*3/uL (ref 1.6–7.0)
Abs Basophils: 0 10*3/uL (ref <0.2)
Abs Eosinophils: 0.2 10*3/uL (ref 0.0–0.5)
Abs Lymphs: 1.8 10*3/uL (ref 0.8–3.1)
Abs Monos: 0.7 10*3/uL (ref 0.2–0.8)
Basophils: 0.6 %
Eosinophils: 2.5 %
Hct: 36.3 % — ABNORMAL LOW (ref 40.0–50.0)
Hgb: 11.5 g/dL — ABNORMAL LOW (ref 13.7–17.5)
Imm Gran %: 0.3 % (ref <1)
Imm Gran Abs: 0 10*3/uL (ref <0.1)
Lymphocytes: 27.8 %
MCH: 28.9 pg (ref 26.0–32.0)
MCHC: 31.7 g/dL — ABNORMAL LOW (ref 32.0–36.0)
MCV: 91.2 um3 (ref 79.0–95.0)
MPV: 10 fL (ref 9.4–12.4)
Monocytes: 11.3 %
Plt Count: 214 10*3/uL (ref 140–370)
RBC: 3.98 10*6/uL — ABNORMAL LOW (ref 4.60–6.10)
RDW: 15.7 % — ABNORMAL HIGH (ref 12.0–14.0)
Segs: 57.5 %
WBC: 6.3 10*3/uL (ref 4.0–10.0)

## 2023-09-21 LAB — TSH, BLOOD: TSH: 2.51 u[IU]/mL (ref 0.27–4.20)

## 2023-09-21 LAB — URINALYSIS WITH CULTURE REFLEX, WHEN INDICATED
Bilirubin: NEGATIVE
Glucose: NEGATIVE
Ketones: NEGATIVE
Leuk Esterase: 500 Leu/uL — AB
Nitrite: NEGATIVE
RBC: >50 — AB (ref 0–?)
Specific Gravity: 1.016 (ref 1.002–1.030)
Urobilinogen: NEGATIVE
WBC: >50 — AB (ref 0–?)
pH: 6 (ref 5.0–8.0)

## 2023-09-21 LAB — TRIGLYCERIDES, BLOOD: Triglycerides: 125 mg/dL (ref 10–170)

## 2023-09-21 LAB — HIV 1/2 ANTIBODY & P24 ANTIGEN ASSAY, BLOOD: HIV 1/2 Antibody & P24 Antigen Assay: NONREACTIVE

## 2023-09-21 LAB — FIBRINOGEN, BLOOD: Fibrinogen: 420 mg/dL (ref 200–450)

## 2023-09-21 LAB — MAGNESIUM, BLOOD: Magnesium: 2 mg/dL (ref 1.6–2.4)

## 2023-09-21 LAB — PROTHROMBIN TIME, BLOOD
INR: 1
PT,Patient: 11.4 s (ref 9.7–12.5)

## 2023-09-21 LAB — HCV ANTIBODY WITH REFLEX QUANT: Hepatitis C Ab: NONREACTIVE

## 2023-09-21 LAB — BILIRUBIN, DIR BLOOD: Bilirubin, Dir: 0.2 mg/dL (ref <=0.2)

## 2023-09-21 LAB — APTT, BLOOD: PTT: 33 s (ref 27–36)

## 2023-09-21 LAB — FREE THYROXINE, BLOOD: Free T4: 1.12 ng/dL (ref 0.93–1.70)

## 2023-09-21 LAB — HEPATITIS B SURFACE AG, BLOOD: HBsAg: NONREACTIVE

## 2023-09-21 LAB — HEPATITIS B CORE AB TOTAL: HBcAb Total: NONREACTIVE

## 2023-09-21 LAB — PHOSPHORUS, BLOOD: Phosphorous: 3.6 mg/dL (ref 2.7–4.5)

## 2023-09-21 LAB — LIPASE, BLOOD: Lipase: 25 U/L (ref 13–60)

## 2023-09-21 LAB — AMYLASE, BLOOD: Amylase: 38 U/L (ref 28–100)

## 2023-09-21 LAB — CHOLESTEROL, TOTAL BLOOD: Cholesterol: 130 mg/dL (ref <200)

## 2023-09-21 NOTE — Patient Instructions (Signed)
Patient Instructions:    --Mychart Video visit on 10/17 at 5:30 pm with Dr. Roseanne Reno     --Have your labs drawn on 10/16 and every 2 weeks at any [facility]      Have questions about Advanced Care Planning/Advance Directive?  PREPARE ([email or web address])           Future Appointments   Date Time Provider Department Center   09/29/2023  1:20 PM Zlomislic, Alger Memos, MD [facility]   10/09/2023  9:00 AM Shea Evans, MD [facility] Onc [facility]   10/18/2023 10:00 AM Horton Finer, NP [facility] Gen Psy [facility]   12/01/2023  8:00 AM West Bali, MD [facility] [place] [facility]         Visit this website for Cancer resources at our [facility].  This includes information on [place] Chemo Support Groups, Mind/Body Wellness, Treatment Education Classes, and other [place] Cancer Services:      [email or web address]       *Chief Operating Officer Website:  [email or web address]    Contact Information:   Brock Ra, MD   Assistant Professor of Medicine  Medical Oncology  Genitourinary Malignancy      Franco Nones, M.S., PA-C  Sr. Physician Assistant    Clinical questions or concerns  Oncology Nurse Case Manager   Elsworth Soho RN, BSN  T: [phone]  Department of Urology   [facility]  [address]  [place], [place] [address]    Dr. Roseanne Reno Follow Up Appointment Assistance  Administrative Assistant   [place]  T: [phone]  F: [phone]    Monday-Friday 8:00- 4:30p.m. (Closed Holidays and Weekends)  Please listen to message carefully for daily information.   Leave a detailed message with your name, medical record number and   telephone number. Messages are usually returned within 24 hours.     [facility] Phone List     AFTER HOURS EMERGENCY NUMBER   Ask for the Oncologist Physician On-Call.                   [phone]    Outpatient Pavilion Scheduling line:  [phone]  Call for information about our new location and to schedule or cancel appointments.    [place] [facility] Scheduling line: [phone]   Call to schedule your [name], CT Scans or MRI studies.    PET Scan Scheduling line: [phone]  Call to schedule your PET scan    Prostate MRI Scheduling line: [phone] or [phone]  Call to schedule your prostate MRI.   Performed at the [facility] ([facility]).    Nuclear Medicine Scheduling Line [phone]   Call to schedule your bone scan, Radium 223     [facility] Infusion Center: [phone]   Call to schedule, cancel, or reschedule chemotherapy appointments.  Infusion Center Hours--- Monday-Friday 7:00-7:30 p.m.; Sat-Sun 8:00-4:30pm.    [facility] ([address]): [phone]  Monday-Friday 730-530 p.m.; Closed Weekends    Radiation Oncology: [phone]    Call to schedule, cancel, or reschedule radiation appointments    Social Worker  [place]: Mathews [place], [place]    T: [phone]  [place]: Henri Medal, LCSW  T: [phone]    Financial Counselors   [phone] at [facility]   [phone] or [phone]      [facility]  [phone] [address]  [place], [place]  [address]    -You can also reach [name] by MyChart. To set up your MyChart please see the following site: [email or web address]  -For cancer support services, please visit out Patient and [facility].  -For [facility] support services, please visit:  -[email or web address]  -For other support from the American Cancer Society, please visit: [email or web address]/Treatment/SupportProgramsServices/index              Thank you for allowing [name] to participate in your care today! We value your feedback as we strive to provide every patient with an exceptional care experience.     You may receive a patient satisfaction survey in the mail in the next  few weeks. Please fill out the survey upon receipt and return it in the self-addressed envelope. Your feedback will be used to help improve the experience for all our patients at [facility].     If you want to share a positive experience you had at our facility please call We Listen at [phone]. If you have any suggestions on how we can improve our services please call the [facility] leadership team at [phone].    For medical questions or emergencies, please call your physician's office or 911. Thank you!

## 2023-09-21 NOTE — Interdisciplinary (Signed)
Culture collected from L PCN and placed in Lab drop off room.

## 2023-09-21 NOTE — Progress Notes (Signed)
Name: Stephen [name]   Date of Birth: [birth year]  Medical Record Number: [id number]    [facility]     Patient ID: Metastatic urothelial carcinoma    Stage: Cancer Staging   Ureter malignant neoplasm, left (CMS-HCC)  Staging form: Renal Pelvis And Ureter, AJCC 8th Edition  - Clinical: Stage IV (cTX, cN0, pM1) - Signed by Cheri Guppy, MD on 12/01/2022    Oncologic History:    04/22/21  CTU: filling defect in L renal pelvis  05/05/21  Cysto with L ureteroscopy revealed a L papillary tumor, biopsied: mixed high (30%) and low grade urothelial carcinoma  06/16/21  Repeat cysto, papillary tumor: pathology low grade papillary [place] II  09/xx/22  Completed induction with mitogel (6 cycles)  08/19/21  HGTa of the bladder, no muscle  08/26/21  Repeat TURBT: NED, muscle identified  10/26/21  MRU: mild thickening in the L renal pelvis  12/24/21  Cysto/L ureteroscopy: dysplasia in the bladder; no disease in L ureter  05/06/22  L PCN placed  06/01/22  TURBT and L ureteroscopy: NED, stricture in place, stent placed  09/29/22  TURBT and L ureteroscopy: NED  10/22/22  MRU: Mild diffuse thickening in the L ureter/renal pelvis. Multiple osseous lesions involving the bilateral iliac crest and right acetabulum concerning for osseous metastatic disease.   11/26/22  PET/CT: diffuse osseous FDG activity with discrete osseous lesions concerning for metastatic disease.  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  Also treated for septic shock, AKI (Cr 2.5) and acute liver injury (AST/ALT 2800/2100).   S/p exchange L PCN.   02/07/23  start XRT to R Shoulder  02/10/23  C2D1 EV (dose reduced to 1.0 mg/kg) + pembro 200 mg  IV  03/03/23  C3D1 EV/pembro, missed D8  03/06/23  Cysto: NED.  Cytology NED.   03/14/23  Seen at [facility] ED for dislodged tube, replaced  04/02-05/24  Admitted with weakness, poor PO intake.   04/06/23  C4D1 EV/pembro  04/13/23  C4D8 EV  04/26/23  PET/CT: osseous lesions much improved, many new sclerotic lesions c/w treatment change.  Nonspecific mediastinal and pelvic LN  05/09-11/24  Admitted for treatment of AKI and rhabdomyolysis  05/11/23  C5D1 EV/P DR to 0.75  06/01/23  C6D1 EV/Pembro 0.75  06/23/23  MRI C-Spine: disease in spine, but no fracture or epidural involvement.  moderate spinal canal stenosis from DJD at C4-C5, and C5-C6 and abutment of ventral cord  06/29/23  C7D1  EV/Pembro 0.75   07/19/23  PET/CT: POD in Lns and osseous lesions  07/20/23  C8D1  EV/Pembro 0.75  09/xx/24  Attempted to place on FGFR trial  09/20/23  Started on erdafitinib    Interim History:  "I'm getting around pretty good"  R arm is bothering him  Feeling very fatigued.     Review of Systems:   A complete ROS was performed and is negative except as documented above.     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  Non smoker  No etoh    Medications:  Current Outpatient Medications on File Prior to Visit   Medication Sig Dispense Refill    acetaminophen (TYLENOL) 500 MG tablet Take 2 tablets (1,000 mg) by mouth every 8 hours as needed for Mild Pain (Pain Score 1-3) or Moderate Pain (Pain Score 4-6). 40 tablet 0    albuterol (PROAIR HFA) 108 (90 Base) MCG/ACT inhaler ProAir HFA 90 mcg/actuation aerosol inhaler      allopurinol (ZYLOPRIM) 100 MG tablet Take 1 tablet (100 mg) by mouth daily. 30 tablet 0    Artificial Tear Solution (SOOTHE XP OP)        busPIRone (BUSPAR) 15 MG tablet Take 1 tablet (15 mg) by mouth 3 times daily. 90 tablet 2    diclofenac (VOLTAREN) 1 % gel Apply 2 g topically every 6 hours as needed (Right knee pain). 100 g 0    DULoxetine (CYMBALTA) 20 MG CR capsule Take 1 capsule (20 mg) by mouth 2 times daily. 180 capsule 2    Erdafitinib 4 MG TABS Take 8 mg by mouth daily. 60 tablet 3    fluticasone propionate (FLONASE) 50 MCG/ACT nasal spray 2 sprays.      HYDROmorphone (DILAUDID) 2 MG tablet Take 1 to 2 tablets (2mg  to 4mg ) by mouth every 4 hours as needed for moderate to severe pain 60 tablet 0    [DISCONTINUED] HYDROmorphone (DILAUDID) 2 MG tablet Take 1 to 2 tablets (2mg  to 4mg ) by mouth every 4 hours as needed for moderate to severe pain 60 tablet 0    ibuprofen (MOTRIN) 600 MG tablet       ketotifen (ALAWAY) 0.025 % ophthalmic solution Place 1 drop into both eyes 2 times daily.      lactulose 10 GM/15ML solution Take 15mL to 30mL by mouth 3 times a day as needed for constipation (makes stools softer) 473 mL 2    loperamide (IMODIUM) 2 MG capsule For mild to moderate diarrhea (less than 5 BMs within 24 hours):  Take 1 capsule (2 mg) by mouth 4 times daily as needed for Diarrhea.  For severe Diarrhea (5 or more medium to large BMs within 24 hours):  take 2 capsules and then 1 capsule every 2 hours until diarrhea stops. 90 capsule 0    loratadine (CLARITIN REDITABS) 10 MG dissolvable tablet       LORazepam (ATIVAN) 0.5 MG tablet Take 1 tablet by mouth 30 to 60 minutes prior to MRI.  May repeat dose once if needed. 2 tablet 0    Multiple Vitamins-Minerals (MULTIVITAMIN WITH MINERALS) TABS tablet Take 1 tablet by mouth daily. 30 tablet 1    naloxone (NARCAN) 4 mg/0.1 mL nasal spray For suspected opioid overdose, call 911! Then spray once in one nostril. Repeat after 3 minutes if no or minimal response using a new spray in other nostril. 2 each 0    Nutritional Supplements (NEPRO) LIQD Take 3 Cans by mouth daily. 30000 mL 11    polyethylene  glycol (MIRALAX) 17 g packet Take 1 packet (17 g) by mouth 2 times daily as needed for Constipation. 1 each 0    pregabalin (LYRICA) 100 MG capsule Take 1 capsule (100 mg) by mouth at bedtime. 30 capsule 0    prochlorperazine (COMPAZINE) 10 MG tablet  Take 1 tablet (10 mg) by mouth every 6 hours as needed (Nausea/Vomiting). 30 tablet 5    senna (SENOKOT) 8.6 MG tablet Take 2 tablets (17.2 mg) by mouth 2 times daily. 30 tablet 0    tamsulosin (FLOMAX) 0.4 MG capsule TAKE 1 CAPSULE BY MOUTH EVERY DAY 90 capsule 1    tizanidine (ZANAFLEX) 2 MG tablet Take 1 tablet (2 mg) by mouth every 8 hours as needed (muscle spasm). 30 tablet 2    traZODone (DESYREL) 50 MG tablet Take one tab nightly by mouth at bedtime as needed for insomnia 90 tablet 0     No current facility-administered medications on file prior to visit.       Physical Examination:   09/21/23  1350   BP: 128/74   Pulse: 55   Temp: 97.9 F (36.6 C)   Resp: 16   SpO2: 97%     ECOG PS 1  General: NAD  HEENT: Moist mucous membranes.   Neck: No thyromegaly or lymphadenopathy.   Chest: Clear to ausculation bilaterally. No wheezes, rales or rhonchi.    Cardiac: Regular rate and rhythm without any murmurs, rubs or gallops.   Abdomen: Abd soft, non-tender. +BS.   Extremities: No clubbing, cyanosis. No edema  Neurological: Awake, alert and oriented x 3. Strength and sensation are grossly intact.   Psychologic: Appropriate mood and affect.  GU: L PCN site clean and dry, draining clear, yellow urine.     Labs: Following labs reviewed  Lab Results   Component Value Date    WBC 6.3 09/21/2023    RBC 3.98 (L) 09/21/2023    HGB 11.5 (L) 09/21/2023    HCT 36.3 (L) 09/21/2023    MCV 91.2 09/21/2023    MCHC 31.7 (L) 09/21/2023    RDW 15.7 (H) 09/21/2023    PLT 214 09/21/2023    MPV 10.0 09/21/2023      Lab Results   Component Value Date    BUN 31 (H) 09/21/2023    CREAT 1.31 (H) 09/21/2023    CL 98 09/21/2023    NA 139 09/21/2023    K 4.1 09/21/2023    Alburtis 9.7 09/21/2023    TBILI  0.85 09/21/2023    ALB 4.2 09/21/2023    TP 7.3 09/21/2023    AST 261 (H) 09/21/2023    ALK 232 (H) 09/21/2023    BICARB 29 09/21/2023    ALT 389 (H) 09/21/2023    GLU 98 09/21/2023                  Assessment:  Keneth Etchells is a 72 year old male with OA, HTN, MDD with L UTUC with metastatic disease ([id number]), with osseous mets (Pet/CT with diffuse enhancement and discrete lesions). FGFR-TACC mutation.  Started on EV+Pembro.      Scans after 4 cycles with improvement of osseous lesions, but demonstrate substantial sclerotic osseous lesions, suggesting treated lesions not scene prior.  Unfortunately developed POD after 8 cycles.     I personally reviewed labs as above.  I personally reviewed scans as above.    He has recently been started on erdafitinib.  Unfortunately his liver enzymes are up which is almost certainly from disease progression.  He has only been on therapy x 1 day.  We will continue erdafitinib for now.  I had a long discussion with Keysean about prognosis and treatment options.  I discussed that prognosis may be poor, on the order of \\a  couple of months  if he does not have a good response to therapy.  He understands and would like to proceed with therapy.  His functional status does seem to have improved and therefore he may be a candidate for cytotoxic therapy in the future if his labs allow for it.     Advanced Urothelial Carcinoma, [id number]  - continue erdafitinib  - labs and visits Q2w, MCVV ok   - proceed with XRT to R shoulder  - consider adding zometa given significnt bone disease; needs dental clearance - he's known to have significant dental issues so this needs to be cleared first    Cancer related pain  - follows with palliative care, on multiple medications for pain including dilaudid  - caution given multiple events of AMS    R shoulder pain  - 2/2 metastatic lesion  - seen by ortho 1/22  - XRT as above    Anxiety  - f/u with palliative care  - monitor closely    RLE edema  -  improved    L PCN, initially placed 04/2022  - draining well, will monitor    This visit was spent addressing a complex, chronic illness that poses a threat to life or bodily function to this patient.  I independently reviewed the patient's history since prior visit, reviewed intervening labs and imaging, counseled the patient on the management of their chronic illness, and placed orders to be done prior to next visit.     Asa Saunas. Roseanne Reno, M.D.  Assistant Professor of Medicine   [employer] [place], [facility]  [facility]  [address]  [place], [place] [address]

## 2023-09-23 LAB — URINE CULTURE

## 2023-09-24 LAB — URINE CULTURE

## 2023-09-26 ENCOUNTER — Encounter (HOSPITAL_BASED_OUTPATIENT_CLINIC_OR_DEPARTMENT_OTHER): Payer: Self-pay

## 2023-09-26 ENCOUNTER — Telehealth (HOSPITAL_BASED_OUTPATIENT_CLINIC_OR_DEPARTMENT_OTHER): Payer: Self-pay

## 2023-09-26 ENCOUNTER — Telehealth (HOSPITAL_BASED_OUTPATIENT_CLINIC_OR_DEPARTMENT_OTHER): Payer: Self-pay | Admitting: Radiation Oncology

## 2023-09-26 DIAGNOSIS — N390 Urinary tract infection, site not specified: Secondary | ICD-10-CM

## 2023-09-26 LAB — TRIIODOTHYRONINE, FREE: Triiodothyronine, Free: 2.7 pg/mL (ref 2.5–4.3)

## 2023-09-26 MED ORDER — AMOXICILLIN 500 MG OR CAPS
500.0000 mg | ORAL_CAPSULE | Freq: Three times a day (TID) | ORAL | 0 refills | Status: DC
Start: 2023-09-26 — End: 2023-11-06

## 2023-09-26 NOTE — Telephone Encounter (Signed)
RN called pt to relay Urine culture results and that antibiotic was sent to his [facility].  There was no answer so RN LVM with this message.    Amoxillcin 500 mg capsules:  Take 1 capsule (500 mg) by mouth 3 times daily

## 2023-09-26 NOTE — Telephone Encounter (Signed)
Cheri Guppy, MD  Elsworth Soho, RN; [place], Brantley Persons, [place]  Can we put him on amoxicillin 500mg  TID x 7 days?  Thanks

## 2023-09-26 NOTE — Telephone Encounter (Signed)
[facility] OUT[facility]IENT [facility] / Medical Oncology Social Work Telephone note:    Current status:  Stephen [name] called in for emotional support.  We were on the phone for 35 minutes.  He was receptive and open.  He wanted to provide me an update on his treatment plan.  He sounded weak, tired but alert.    He is understandably disappointed about the new liver enzyme results.  He informed me he's now on erdafitinib/immunotherapy (taking two pills a week).   As per Dr. Marca Ancona note 10/3: "I had a long discussion with Stephen [name] about prognosis and treatment options. I discussed that prognosis may be poor, on the order of  a couple of months if he does not have a good response to therapy. He understands and would like to proceed with therapy."    Stephen [name] is coping as best he can given the challenges and the ups and downs of his course.  He is currently working on coordinating all his appts, labs, etc., and understandably a little overwhelmed.  He continues to have support from his sister.  And he continues to be able to manage his appts., labs, etc., and schedules transportation on his own.  I attempt to inquire how Stephen [name] feels about guarded prognosis, while acknowledging this, he politely avoids the subject and keeps our conversation on more practical matters.    Intervention:  Provided Stephen [name] with supportive counseling and validation of feelings.  Focused on client strengths and his self advocacy.    Resource referral:  Connected Stephen [name] via 3 way call to [facility] [phone], and spoke with social worker Cassie.  Assisted Stephen [name] in applying for their bladder cancer transportation grant ($300).    Kennedy Bucker can be submitted after 10/04/23, his anniversary date for last year's grant.      Plan:  Stephen [name] to meet with Dr. Logan Bores on 10/21 and he is looking forward to   this.  Stephen [name] has an appt with Horton Finer, NP, of [facility] on 10/30.  Stephen [name] will send me proof of income for Cancer Care grant.  Will meet in person after Dr. Logan Bores' appt  on 10/21 to provide ongoing support and get his signature on the grant application.  Will continue to follow.    Kingsley Plan, LCSW  [facility] Social Worker  [phone]

## 2023-09-27 ENCOUNTER — Encounter (HOSPITAL_BASED_OUTPATIENT_CLINIC_OR_DEPARTMENT_OTHER): Payer: Self-pay | Admitting: Family Medicine

## 2023-09-27 DIAGNOSIS — C7951 Secondary malignant neoplasm of bone: Secondary | ICD-10-CM

## 2023-09-27 DIAGNOSIS — G893 Neoplasm related pain (acute) (chronic): Secondary | ICD-10-CM

## 2023-09-28 ENCOUNTER — Other Ambulatory Visit: Payer: Self-pay

## 2023-09-28 ENCOUNTER — Telehealth (HOSPITAL_BASED_OUTPATIENT_CLINIC_OR_DEPARTMENT_OTHER): Payer: Self-pay | Admitting: Radiation Oncology

## 2023-09-28 MED ORDER — HYDROMORPHONE HCL 2 MG OR TABS
ORAL_TABLET | ORAL | 0 refills | Status: DC
Start: 2023-09-28 — End: 2023-10-09
  Filled 2023-09-28: qty 60, 5d supply, fill #0

## 2023-09-28 NOTE — Telephone Encounter (Signed)
[name] Progress Note    Received refill request for hydromorphone 2mg  1-2 tablets (2mg  to 4mg ) PO q4h PRN pain.  Patient's last appointment with our team was on 09/04/23 and patient's next appointment with our team is on 10/09/23.  Patient has Narcan PRN on file.  Reviewed CURES, per CURES, last picked up hydromorphone 2mg  #60 on 09/19/23.  No aberrancies noted.  Will send refill as requested.    Shea Shakeema Lippman, MD  Assistant Professor  [facility] Palliative Care Service  [email or web address]

## 2023-09-28 NOTE — Telephone Encounter (Signed)
Refill request    Dilaudid 2 mg 1-2 tablets q 4 hrs # 60 last filled 9/30    Last visit 9/16 Dr. Logan Bores  Next visit 10/21 Dr. Logan Bores    [facility]    Routing to provider

## 2023-09-28 NOTE — Telephone Encounter (Signed)
From: [name]  To: Margarita Mail, MD  Sent: 09/27/2023 6:33 PM PDT  Subject: medication renewal     please renew my Dilaudid I will be out at the hospital for a ortho visit Friday October 11 can pick it up at [facility] will see you soon have some serious issues to discuss thank you/MR#[id number]

## 2023-09-29 ENCOUNTER — Other Ambulatory Visit: Payer: Self-pay

## 2023-09-29 ENCOUNTER — Encounter (HOSPITAL_BASED_OUTPATIENT_CLINIC_OR_DEPARTMENT_OTHER): Payer: Self-pay | Admitting: Family Medicine

## 2023-09-29 ENCOUNTER — Other Ambulatory Visit (INDEPENDENT_AMBULATORY_CARE_PROVIDER_SITE_OTHER): Payer: Medicare Other

## 2023-09-29 ENCOUNTER — Telehealth (HOSPITAL_BASED_OUTPATIENT_CLINIC_OR_DEPARTMENT_OTHER): Payer: Self-pay

## 2023-09-29 ENCOUNTER — Ambulatory Visit: Payer: Medicare Other | Attending: Orthopaedic Surgery of the Spine | Admitting: Orthopaedic Surgery of the Spine

## 2023-09-29 ENCOUNTER — Ambulatory Visit (HOSPITAL_BASED_OUTPATIENT_CLINIC_OR_DEPARTMENT_OTHER): Payer: Medicare Other

## 2023-09-29 DIAGNOSIS — G8929 Other chronic pain: Secondary | ICD-10-CM | POA: Insufficient documentation

## 2023-09-29 DIAGNOSIS — M5412 Radiculopathy, cervical region: Secondary | ICD-10-CM | POA: Insufficient documentation

## 2023-09-29 DIAGNOSIS — Z981 Arthrodesis status: Secondary | ICD-10-CM | POA: Insufficient documentation

## 2023-09-29 DIAGNOSIS — C7910 Secondary malignant neoplasm of unspecified urinary organs: Secondary | ICD-10-CM

## 2023-09-29 DIAGNOSIS — M4802 Spinal stenosis, cervical region: Secondary | ICD-10-CM | POA: Insufficient documentation

## 2023-09-29 DIAGNOSIS — M545 Low back pain, unspecified: Secondary | ICD-10-CM | POA: Insufficient documentation

## 2023-09-29 DIAGNOSIS — G959 Disease of spinal cord, unspecified: Secondary | ICD-10-CM | POA: Insufficient documentation

## 2023-09-29 LAB — COMPREHENSIVE METABOLIC PANEL, BLOOD
ALT (SGPT): 453 U/L — ABNORMAL HIGH (ref 0–41)
AST (SGOT): 325 U/L — ABNORMAL HIGH (ref 0–40)
Albumin: 4 g/dL (ref 3.5–5.2)
Alkaline Phos: 267 U/L — ABNORMAL HIGH (ref 40–129)
Anion Gap: 9 mmol/L (ref 7–15)
BUN: 27 mg/dL — ABNORMAL HIGH (ref 8–23)
Bicarbonate: 29 mmol/L (ref 22–29)
Bilirubin, Tot: 0.93 mg/dL (ref <1.2)
Calcium: 9.7 mg/dL (ref 8.5–10.6)
Chloride: 101 mmol/L (ref 98–107)
Creatinine: 1.35 mg/dL — ABNORMAL HIGH (ref 0.67–1.17)
Glucose: 97 mg/dL (ref 70–99)
Potassium: 4.4 mmol/L (ref 3.5–5.1)
Sodium: 139 mmol/L (ref 136–145)
Total Protein: 7.2 g/dL (ref 6.0–8.0)
eGFR Based on CKD-EPI 2021 Equation: 57 mL/min/{1.73_m2}

## 2023-09-29 LAB — CBC WITH DIFF, BLOOD
ANC-Automated: 3.7 10*3/uL (ref 1.6–7.0)
Abs Basophils: 0 10*3/uL (ref <0.2)
Abs Eosinophils: 0.2 10*3/uL (ref 0.0–0.5)
Abs Lymphs: 1.8 10*3/uL (ref 0.8–3.1)
Abs Monos: 0.7 10*3/uL (ref 0.2–0.8)
Basophils: 0.6 %
Eosinophils: 2.8 %
Hct: 34.2 % — ABNORMAL LOW (ref 40.0–50.0)
Hgb: 11.4 g/dL — ABNORMAL LOW (ref 13.7–17.5)
Imm Gran %: 0.3 % (ref <1)
Imm Gran Abs: 0 10*3/uL (ref <0.1)
Lymphocytes: 28.1 %
MCH: 29.8 pg (ref 26.0–32.0)
MCHC: 33.3 g/dL (ref 32.0–36.0)
MCV: 89.3 um3 (ref 79.0–95.0)
MPV: 10 fL (ref 9.4–12.4)
Monocytes: 11.1 %
Plt Count: 197 10*3/uL (ref 140–370)
RBC: 3.83 10*6/uL — ABNORMAL LOW (ref 4.60–6.10)
RDW: 15.6 % — ABNORMAL HIGH (ref 12.0–14.0)
Segs: 57.1 %
WBC: 6.5 10*3/uL (ref 4.0–10.0)

## 2023-09-29 LAB — PHOSPHORUS, BLOOD: Phosphorous: 4.9 mg/dL — ABNORMAL HIGH (ref 2.7–4.5)

## 2023-09-29 NOTE — Progress Notes (Addendum)
ORTHOPAEDIC SPINE SURGERY     Visit Type: Office Visit    Reason For Visit: Follow Up ( Stage 1: ALIF L5-S1 ; Stage 2 revision laminectomy and PSIF L3 thru S1, DOS: 8/31, pain)    Surgery Date: 11/08/2021    Procedure: XLIF L2-3 and PSIF L2 thru pelvis    Interval HPI:  72 year old gentleman with metastatic urothelial carcinoma now 2 years status post XLIF L2-3 and posterior spinal instrumented fusion L2 to pelvis who presents for follow-up.  At his last follow-up appointment, there was concern for ongoing right lower extremity radicular pain as well as low back pain now for an MRI of the thoracic spine was ordered.  The patient continues to have that right lower extremity pain primarily at the medial aspect of the knee.  His primary complaint is low back pain.  He also has neck pain and bilateral upper extremity pain.  He endorses clumsy hands, cleansed the feet, all no difficulty with holding onto objects, difficulty dropping objects, changes in his handwriting, difficulty with buttoning his shirt.  He over the course of the past months has become more dependent on utilizing a cane and now rolling walker due to pain as well as difficulty ambulating due to feeling off balance.  He also reports numbness and tingling of bilateral feet and hands.  The patient has failed treatment of his pain with ongoing oral opioids via the palliative care clinic as well as injections with pain management.        Prior History Of Present Illness: 72 year old male returns to spine clinic for follow up evaluation now approximately 6 months s/p revision lumbar decompression and fusion L2 thru pelvis. He states that he is doing well although has had ongoing complaints of generalized back stiffness and intermittent pain, which is well controlled. He had completed a course of PT, although has had several medical issues. 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 His primary  complaint today is neck pain and stiffness with associated pain and paresthesias extending into his upper extremities and shoulder.  Patient rates Tallis's pain as 8. Patient reports no changes in bowel and bladder function. Patient returns for follow up.    Interval hx 07/03/23: patient returns today for follow up. He reports he has a lot of pain everywhere. He says his biggest source of pain are his anterior thighs bilaterally, w/ pain radiating to the R medial knee, and complains of R knee buckling. He reports numbness/tingling in his feet as well. He says he recently saw Speech therapy due to some swalling issues and was told it is due to his cervical spine. He reports radiating pain into both arms as well.     Allergies:   Allergies   Allergen Reactions    Sulfa Drugs Unspecified, Hives and Other       Medications:   Current Outpatient Medications   Medication Sig    acetaminophen (TYLENOL) 500 MG tablet Take 2 tablets (1,000 mg) by mouth every 8 hours as needed for Mild Pain (Pain Score 1-3) or Moderate Pain (Pain Score 4-6).    albuterol (PROAIR HFA) 108 (90 Base) MCG/ACT inhaler ProAir HFA 90 mcg/actuation aerosol inhaler    allopurinol (ZYLOPRIM) 100 MG tablet Take 1 tablet (100 mg) by mouth daily.    amoxicillin (AMOXIL) 500 MG capsule Take 1 capsule (500 mg) by mouth 3 times daily.    Artificial Tear Solution (SOOTHE XP OP)  busPIRone (BUSPAR) 15 MG tablet Take 1 tablet (15 mg) by mouth 3 times daily.    diclofenac (VOLTAREN) 1 % gel Apply 2 g topically every 6 hours as needed (Right knee pain).    DULoxetine (CYMBALTA) 20 MG CR capsule Take 1 capsule (20 mg) by mouth 2 times daily.    Erdafitinib 4 MG TABS Take 8 mg by mouth daily.    fluticasone propionate (FLONASE) 50 MCG/ACT nasal spray 2 sprays.    HYDROmorphone (DILAUDID) 2 MG tablet Take 1 to 2 tablets (2mg  to 4mg ) by mouth every 4 hours as needed for moderate to severe pain    ibuprofen (MOTRIN) 600 MG tablet     ketotifen (ALAWAY) 0.025 %  ophthalmic solution Place 1 drop into both eyes 2 times daily.    lactulose 10 GM/15ML solution Take 15mL to 30mL by mouth 3 times a day as needed for constipation (makes stools softer)    loperamide (IMODIUM) 2 MG capsule For mild to moderate diarrhea (less than 5 BMs within 24 hours):  Take 1 capsule (2 mg) by mouth 4 times daily as needed for Diarrhea.  For severe Diarrhea (5 or more medium to large BMs within 24 hours):  take 2 capsules and then 1 capsule every 2 hours until diarrhea stops.    loratadine (CLARITIN REDITABS) 10 MG dissolvable tablet     LORazepam (ATIVAN) 0.5 MG tablet Take 1 tablet by mouth 30 to 60 minutes prior to MRI.  May repeat dose once if needed.    Multiple Vitamins-Minerals (MULTIVITAMIN WITH MINERALS) TABS tablet Take 1 tablet by mouth daily.    naloxone (NARCAN) 4 mg/0.1 mL nasal spray For suspected opioid overdose, call 911! Then spray once in one nostril. Repeat after 3 minutes if no or minimal response using a new spray in other nostril.    Nutritional Supplements (NEPRO) LIQD Take 3 Cans by mouth daily.    polyethylene glycol (MIRALAX) 17 g packet Take 1 packet (17 g) by mouth 2 times daily as needed for Constipation.    pregabalin (LYRICA) 100 MG capsule Take 1 capsule (100 mg) by mouth at bedtime.    prochlorperazine (COMPAZINE) 10 MG tablet Take 1 tablet (10 mg) by mouth every 6 hours as needed (Nausea/Vomiting).    senna (SENOKOT) 8.6 MG tablet Take 2 tablets (17.2 mg) by mouth 2 times daily.    tamsulosin (FLOMAX) 0.4 MG capsule TAKE 1 CAPSULE BY MOUTH EVERY DAY    tizanidine (ZANAFLEX) 2 MG tablet Take 1 tablet (2 mg) by mouth every 8 hours as needed (muscle spasm).    traZODone (DESYREL) 50 MG tablet Take one tab nightly by mouth at bedtime as needed for insomnia     No current facility-administered medications for this visit.         Review of Systems:  CONSTITUTIONAL: No unexpected weight loss, fevers, chills, or anorexia. SKIN: Noncontributory. GENITOURINARY: Symptoms as  noted above in HPI. NEUROLOGIC: Symptoms as noted above in HPI. MUSCULOSKELETAL: Symptoms as noted above in HPI.    Physical Examination:  There were no vitals taken for this visit.  CONSTITUTIONAL: Well appearing and well groomed. PSYCHOLOGICAL: Alert and oriented and appropriate to situation. INTEGUMENT: Intact. VASCULAR: Radial and pedal pulses are intact and symmetric. EXTREMITIES: Bilateral upper and lower extremities have good range of motion and no significant deformities. GAIT: Brisk with good coordination.     CERVICAL SPINE:     MOTOR    RT LT  Trapezius   5/5  5/5  Deltoid    5/5  5/5  Bicep    5/5  4/5  Tricep    5/5  5/5  Wrist flexor   5/5  5/5  Wrist extensor   5/5  5/5  Intrinsics   5/5  4/5  Grip    5/5  4/5    DEEP TENDON REFLEXES       RT  LT  Bicep    2+  2+  Brachioradialis  2+  2+  Tricep    2+  2+     Sensation intact to light touch from C2-T1   Positive Hoffmann's bilaterally    LUMBAR SPINE  Nontender to percussion. Heel to toe walk without deficit. Sensation intact of the lower extremities. Trendelenburg sign negative. Skin intact, incisions well healed.    MOTOR STRENGTH       RT  LT  Iliopsoas   4/5  5-/5  Quadricep   4+/5  5/5  Anterior tibialis  5/5  5/5  Extensor hallucis longus 5/5  5/5  Gastrocsoleus   5/5  5/5    DEEP TENDON REFLEXES       RT  LT  Patellar   2+  2+  Achilles   2+  2+    PATHOLOGIC REFLEXES      RT LT  Clonus    Neg Neg  Babinski   Neg Neg  Hoffmans   Neg Neg    STRAIGHT LEG RAISING       RT  LT  Sitting @ 90 degrees   Neg Neg    IMAGING STUDIES:     MRI of the thoracic spine performed in August of 2024 was reviewed by myself personally in clinic which revealed no disease that was compressive of neural elements throughout the thoracic spine    MRI of the cervical spine performed in 2024 was reviewed by myself personally in clinic which reveal multilevel moderate central stenosis with 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       PLAN/DISCUSSION: Reviewed clinical history as well as exam results and imaging findings with patient in detail.     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  CT C-spine and L-spine for further potential OR planning  PT referral placed  FOLLOWUP: Return to clinic: three months. The patient is encouraged to call us with any questions or problems in the interim. Our contact numbers were given to the patient.

## 2023-09-29 NOTE — Interdisciplinary (Signed)
Patient walked in requesting nursing to look at his urine. Reports dark colored urine from left nephrostomy that started this morning. Denies fever, chills, pain, decrease in urine production.   Urine clear, appears brown tinged, no blood clots seen in urine bag. Photo uploaded to media. RN Yasmin notified. Pt asking if this is a side effect of his chemo medication. Message sent to RN Danielle.     Pt aware rx sent on 10/8 for abx for UTI, pt confirms he has started the antibiotic. ED precautions provided.

## 2023-09-29 NOTE — Telephone Encounter (Signed)
RN received call from pt.  Pt reports:  -Pt's urine is darker/rust color (see media).  At this time of RN call, pt now states that his urine is now clear and no longer dark  -Drinking 1 L water a day  -Urine still smells, "But it always smells"  -Pt is taking amoxicillin as prescribed for positive urine culture  -Denies fever/chills      Pt with metastatic urothelial carcinoma currently taking Erdafitinib.      Plan/Recommendations:  -Continue 1-2 L of water intake daily  -ED precautions  -NOV 10/17    Reviewed with Dr. Roseanne Reno over the phone

## 2023-09-29 NOTE — Progress Notes (Signed)
Sponsor / Study title: [employer]. / "A Phase 1, Open-Label Study of [id number] to Assess Safety, Tolerability, and Pharmacokinetics in Patients with Advanced Solid Tumors"      IRB: [phone]      I reviewed Mr. Stephen [name] current clinical, laboratory and imaging data and the patient is a potential candidate for IRB [phone].     I discussed standard of care, and clinical trial options as appropriate with the patient on 10-Aug-2023. I reviewed with the patient the currently available clinical trials at our institution relevant to the patient's medical condition in relation to the following: Main objectives of the trial, available therapeutic agent, research phase of the trial, known side effects, contraception usage as applicable and explained that there may be unforeseeable risks.     Additionally, it was discussed that while I have initially reviewed the patient's medical record to determine the patient is potentially eligible for the trial, further evaluation during the screening period may reveal new information excluding the patient from participation.     We discussed that the routine care costs procedures are billed to their medical insurance while the research specific services are covered by the study sponsor.     The patient was advised to take the necessary time to make an informed decision about participating in the trial. I provided the patient with the opportunity to ask any questions in reference to the study which I answered to the patient's satisfaction. I emphasized that if the patient is ineligible for or decides not to participate in the study, I would continue to be their treating physician.     The research coordinator met with the patient to further discuss the informed consent and study details on 08-Sep-2023. A consent in the patient's preferable literature language was provided. The patient was encouraged to contact the research staff or myself with any  questions or concerns related to the study.

## 2023-10-01 ENCOUNTER — Other Ambulatory Visit (HOSPITAL_BASED_OUTPATIENT_CLINIC_OR_DEPARTMENT_OTHER): Payer: Self-pay | Admitting: Nurse Practitioner

## 2023-10-01 DIAGNOSIS — C7910 Secondary malignant neoplasm of unspecified urinary organs: Secondary | ICD-10-CM

## 2023-10-01 DIAGNOSIS — F064 Anxiety disorder due to known physiological condition: Secondary | ICD-10-CM

## 2023-10-01 DIAGNOSIS — G893 Neoplasm related pain (acute) (chronic): Secondary | ICD-10-CM

## 2023-10-02 MED ORDER — PREGABALIN 100 MG OR CAPS
100.0000 mg | ORAL_CAPSULE | Freq: Every evening | ORAL | 0 refills | Status: DC
Start: 2023-10-02 — End: 2023-10-09

## 2023-10-02 NOTE — Telephone Encounter (Signed)
LOV 09/29/23 Dr Z  Follow Up ( Stage 1: ALIF L5-S1 ; Stage 2 revision laminectomy and PSIF L3 thru S1, DOS: 8/31, pain)     Surgery Date: 11/08/2021       Refill sent to provider.

## 2023-10-05 ENCOUNTER — Other Ambulatory Visit: Payer: Self-pay

## 2023-10-05 ENCOUNTER — Ambulatory Visit: Payer: Medicare Other | Admitting: Hematology & Oncology

## 2023-10-05 DIAGNOSIS — R748 Abnormal levels of other serum enzymes: Secondary | ICD-10-CM

## 2023-10-05 DIAGNOSIS — C688 Malignant neoplasm of overlapping sites of urinary organs: Secondary | ICD-10-CM

## 2023-10-05 DIAGNOSIS — G893 Neoplasm related pain (acute) (chronic): Secondary | ICD-10-CM

## 2023-10-05 DIAGNOSIS — C7951 Secondary malignant neoplasm of bone: Secondary | ICD-10-CM

## 2023-10-05 DIAGNOSIS — K5903 Drug induced constipation: Secondary | ICD-10-CM

## 2023-10-05 NOTE — Patient Instructions (Addendum)
Patient Instructions:    -- Follow-up in 2 weeks. Mychart Video visit on 11/4 at 12:30 pm with Dr. Roseanne Reno     -- Have your labs drawn every 2 weeks at any [facility] (standing order expires 03/22/2024): CBC, Cmp, Phosphorous      Have questions about Advanced Care Planning/Advance Directive?  PREPARE ([email or web address])           Future Appointments   Date Time Provider Department Center   10/09/2023  7:00 AM MUC CT SIM [facility] RAD ONC MUC   10/09/2023  9:00 AM Shea Evans, MD MUC Onc MUC   10/18/2023  8:35 AM ROP [facility] MUC RAD ONC MUC   10/18/2023 10:00 AM Horton Finer, NP MUC Gen Psy MUC   12/01/2023  8:00 AM [place], MD [facility] [place] [facility]         Visit this website for Cancer resources at our [facility].  This includes information on [place] Chemo Support Groups, Mind/Body Wellness, Treatment Education Classes, and other [facility] Cancer Services:      [email or web address]       *Chief Operating Officer Website:  [email or web address]    Contact Information:   Brock Ra, MD   Assistant Professor of Medicine  Medical Oncology  Genitourinary Malignancy      Franco Nones, M.S., PA-C  Sr. Physician Assistant    Clinical questions or concerns  Oncology Nurse Case Manager   Elsworth Soho RN, BSN  T: [phone]  Department of Urology   [facility]  [address]  [place], [place] [address]    Dr. Roseanne Reno Follow Up Appointment Assistance  Administrative Assistant   [employer]  T: [phone]  F: [phone]    Monday-Friday 8:00- 4:30p.m. (Closed Holidays and Weekends)  Please listen to message carefully for daily information.   Leave a detailed message with your name, medical record number and   telephone number. Messages are usually returned within 24 hours.     [facility] Phone List     AFTER HOURS EMERGENCY NUMBER   Ask for the  Oncologist Physician On-Call.                   [phone]    Outpatient Pavilion Scheduling line: [phone]  Call for information about our new location and to schedule or cancel appointments.    [facility] Scheduling line: [phone]   Call to schedule your [name], CT Scans or MRI studies.    PET Scan Scheduling line: [phone]  Call to schedule your PET scan    Prostate MRI Scheduling line: [phone] or [phone]  Call to schedule your prostate MRI.   Performed at the [facility] ([facility]).    Nuclear Medicine Scheduling Line [phone]   Call to schedule your bone scan, Radium 223     [facility] Infusion Center: [phone]   Call to schedule, cancel, or reschedule chemotherapy appointments.  Infusion Center Hours--- Monday-Friday 7:00-7:30 p.m.; Sat-Sun 8:00-4:30pm.    [facility] ([address]): [phone]  Monday-Friday 730-530 p.m.; Closed Weekends    Radiation Oncology: [phone]    Call to schedule, cancel, or reschedule radiation appointments    Social Worker  [place]: Mathews [place], [place]    T: [phone]  [facility]: Henri Medal, LCSW  T: [phone]    Financial Counselors   [phone] at [facility]   [phone]  or [phone]      [facility]  [address] [place] [address]    -You can also reach [name] by MyChart. To set up your MyChart please see the following site: [email or web address]  -For cancer support services, please visit out Patient and [facility].  -For [facility] support services, please visit:  -[email or web address]  -For other support from the American Cancer Society, please visit: [email or web address]              Thank you for allowing [name] to participate in your care today! We value your feedback as we strive to provide every patient with an  exceptional care experience.     You may receive a patient satisfaction survey in the mail in the next few weeks. Please fill out the survey upon receipt and return it in the self-addressed envelope. Your feedback will be used to help improve the experience for all our patients at [facility].     If you want to share a positive experience you had at our facility please call We Listen at [phone]. If you have any suggestions on how we can improve our services please call the [facility] leadership team at [phone].    For medical questions or emergencies, please call your physician's office or 911. Thank you!

## 2023-10-05 NOTE — Progress Notes (Signed)
Name: Stephen [name]   Date of Birth: 05/27/1951  Medical Record Number: [id number]    Genitourinary Medical Oncology Clinic     Patient ID: Metastatic urothelial carcinoma    Stage: Cancer Staging   Ureter malignant neoplasm, left (CMS-HCC)  Staging form: Renal Pelvis And Ureter, AJCC 8th Edition  - Clinical: Stage IV (cTX, cN0, pM1) - Signed by Cheri Guppy, MD on 12/01/2022    Oncologic History:    04/22/21  CTU: filling defect in L renal pelvis  05/05/21  Cysto with L ureteroscopy revealed a L papillary tumor, biopsied: mixed high (30%) and low grade urothelial carcinoma  06/16/21  Repeat cysto, papillary tumor: pathology low grade papillary [place]  09/xx/22  Completed induction with mitogel (6 cycles)  08/19/21  HGTa of the bladder, no muscle  08/26/21  Repeat TURBT: NED, muscle identified  10/26/21  MRU: mild thickening in the L renal pelvis  12/24/21  Cysto/L ureteroscopy: dysplasia in the bladder; no disease in L ureter  05/06/22  L PCN placed  06/01/22  TURBT and L ureteroscopy: NED, stricture in place, stent placed  09/29/22  TURBT and L ureteroscopy: NED  10/22/22  MRU: Mild diffuse thickening in the L ureter/renal pelvis. Multiple osseous lesions involving the bilateral iliac crest and right acetabulum concerning for osseous metastatic disease.   11/26/22  PET/CT: diffuse osseous FDG activity with discrete osseous lesions concerning for metastatic disease.  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  Also treated for septic shock, AKI (Cr 2.5) and acute liver injury (AST/ALT 2800/2100).   S/p exchange L PCN.   02/07/23  start XRT to R Shoulder  02/10/23  C2D1 EV (dose reduced to 1.0 mg/kg) + pembro 200 mg  IV  03/03/23  C3D1 EV/pembro, missed D8  03/06/23  Cysto: NED.  Cytology NED.   03/14/23  Seen at [facility] ED for dislodged tube, replaced  04/02-05/24  Admitted with weakness, poor PO intake.   04/06/23  C4D1 EV/pembro  04/13/23  C4D8 EV  04/26/23  PET/CT: osseous lesions much improved, many new sclerotic lesions c/w treatment change.  Nonspecific mediastinal and pelvic LN  05/09-11/24  Admitted for treatment of AKI and rhabdomyolysis  05/11/23  C5D1 EV/P DR to 0.75  06/01/23  C6D1 EV/Pembro 0.75  06/23/23  MRI C-Spine: disease in spine, but no fracture or epidural involvement.  moderate spinal canal stenosis from DJD at C4-C5, and C5-C6 and abutment of ventral cord  06/29/23  C7D1  EV/Pembro 0.75   07/19/23  PET/CT: POD in Lns and osseous lesions  07/20/23  C8D1  EV/Pembro 0.75  09/xx/24  Attempted to place on FGFR trial  09/20/23  Started on erdafitinib    Interim History:  He's currently on erdafitinib.  He has been taking hydromorphone, but might be taking a bit less  Saw orthopedics and ordered CT scans for his spine.  Gums are irritated, has a lot of dry mouth.  Still just having constipation.    Review of Systems:   A complete ROS was performed and is negative except as documented above.     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  Non smoker  No etoh    Medications:  Current Outpatient Medications on File Prior to Visit   Medication Sig Dispense Refill    acetaminophen (TYLENOL) 500 MG tablet Take 2 tablets (1,000 mg) by mouth every 8 hours as needed for Mild Pain (Pain Score 1-3) or Moderate Pain (Pain Score 4-6). 40 tablet 0    albuterol (PROAIR HFA) 108 (90 Base) MCG/ACT inhaler ProAir HFA 90 mcg/actuation aerosol  inhaler      allopurinol (ZYLOPRIM) 100 MG tablet Take 1 tablet (100 mg) by mouth daily. 30 tablet 0    amoxicillin (AMOXIL) 500 MG capsule Take 1 capsule (500 mg) by mouth 3 times daily. 21 capsule 0    Artificial Tear Solution (SOOTHE XP OP)       busPIRone (BUSPAR) 15 MG tablet Take 1 tablet (15 mg) by mouth 3 times daily. 90 tablet 2    diclofenac (VOLTAREN) 1 % gel Apply 2 g topically every 6 hours as needed (Right knee pain). 100 g 0    DULoxetine (CYMBALTA) 20 MG CR capsule Take 1 capsule (20 mg) by mouth 2 times daily. 180 capsule 2    Erdafitinib 4 MG TABS Take 8 mg by mouth daily. 60 tablet 3    fluticasone propionate (FLONASE) 50 MCG/ACT nasal spray 2 sprays.      HYDROmorphone (DILAUDID) 2 MG tablet Take 1 to 2 tablets (2mg  to 4mg ) by mouth every 4 hours as needed for moderate to severe pain 60 tablet 0    ibuprofen (MOTRIN) 600 MG tablet       ketotifen (ALAWAY) 0.025 % ophthalmic solution Place 1 drop into both eyes 2 times daily.      lactulose 10 GM/15ML solution Take 15mL to 30mL by mouth 3 times a day as needed for constipation (makes stools softer) 473 mL 2    loperamide (IMODIUM) 2 MG capsule For mild to moderate diarrhea (less than 5 BMs within 24 hours):  Take 1 capsule (2 mg) by mouth 4 times daily as needed for Diarrhea.  For severe Diarrhea (5 or more medium to large BMs within 24 hours):  take 2 capsules and then 1 capsule every 2 hours until diarrhea stops. 90 capsule 0    loratadine (CLARITIN REDITABS) 10 MG dissolvable tablet       LORazepam (ATIVAN) 0.5 MG tablet Take 1 tablet by mouth 30 to 60 minutes prior to MRI.  May repeat dose once if needed. 2 tablet 0    Multiple Vitamins-Minerals (MULTIVITAMIN WITH MINERALS) TABS tablet Take 1 tablet by mouth daily. 30 tablet 1    naloxone (NARCAN) 4 mg/0.1 mL nasal spray For suspected opioid overdose, call 911! Then spray once in one nostril. Repeat after 3 minutes if no or minimal response using a new spray in other nostril. 2 each 0     Nutritional Supplements (NEPRO) LIQD Take 3 Cans by mouth daily. 30000 mL 11    polyethylene glycol (MIRALAX) 17 g packet Take 1 packet (17 g) by mouth 2 times daily as needed for Constipation. 1 each 0    pregabalin (LYRICA) 100 MG capsule Take 1 capsule (100 mg)  by mouth at bedtime. 30 capsule 0    [DISCONTINUED] pregabalin (LYRICA) 100 MG capsule Take 1 capsule (100 mg) by mouth at bedtime. 30 capsule 0    prochlorperazine (COMPAZINE) 10 MG tablet Take 1 tablet (10 mg) by mouth every 6 hours as needed (Nausea/Vomiting). 30 tablet 5    senna (SENOKOT) 8.6 MG tablet Take 2 tablets (17.2 mg) by mouth 2 times daily. 30 tablet 0    tamsulosin (FLOMAX) 0.4 MG capsule TAKE 1 CAPSULE BY MOUTH EVERY DAY 90 capsule 1    tizanidine (ZANAFLEX) 2 MG tablet Take 1 tablet (2 mg) by mouth every 8 hours as needed (muscle spasm). 30 tablet 2    traZODone (DESYREL) 50 MG tablet Take one tab nightly by mouth at bedtime as needed for insomnia 90 tablet 0     No current facility-administered medications on file prior to visit.       Physical Examination:  There were no vitals filed for this visit.    ECOG PS 1  General: NAD  MCVV    Labs: Following labs reviewed  Lab Results   Component Value Date    WBC 6.5 09/29/2023    RBC 3.83 (L) 09/29/2023    HGB 11.4 (L) 09/29/2023    HCT 34.2 (L) 09/29/2023    MCV 89.3 09/29/2023    MCHC 33.3 09/29/2023    RDW 15.6 (H) 09/29/2023    PLT 197 09/29/2023    MPV 10.0 09/29/2023      Lab Results   Component Value Date    BUN 27 (H) 09/29/2023    CREAT 1.35 (H) 09/29/2023    CL 101 09/29/2023    NA 139 09/29/2023    K 4.4 09/29/2023    Fletcher 9.7 09/29/2023    TBILI 0.93 09/29/2023    ALB 4.0 09/29/2023    TP 7.2 09/29/2023    AST 325 (H) 09/29/2023    ALK 267 (H) 09/29/2023    BICARB 29 09/29/2023    ALT 453 (H) 09/29/2023    GLU 97 09/29/2023                  Assessment:  Stephen [name] is a 72 year old male with OA, HTN, MDD with L UTUC with metastatic disease ([id number]), with osseous mets (Pet/CT  with diffuse enhancement and discrete lesions). FGFR-TACC mutation.  His lines of therapy have included:  EV/P --> POD after 8 cycles  erdafitinib    I personally reviewed labs as above.  I personally reviewed scans as above.    Started on erda about 2 weeks ago.  No significnat change in his symptoms.    LFTs rising, but rate of rise seems down.  Phos up to 4.9 - will monitor.  Continue therapy  He has significant disease in the spine, I will reach out to rad onc about possible treatment for the spine.     Advanced Urothelial Carcinoma, [id number]  - continue erdafitinib  - MCVV on 11/4 for follow up   - labs and visits Q2w, MCVV ok   - proceed with XRT to R shoulder  - discuss radiation to spine with radiation oncology and orthopedics  - he is to get CT scans of his spines ordered by ortho  - consider adding zometa given significnt bone disease; needs dental clearance - he's known to have significant dental issues so this needs to be cleared first    Cancer related pain, constipation  - follows with palliative  care, on multiple medications for pain including dilaudid  - caution given multiple events of AMS    R shoulder pain  - 2/2 metastatic lesion  - seen by ortho 1/22  - planned to complete XRT     Anxiety  - f/u with palliative care  - monitor closely    RLE edema  - improved    L PCN, initially placed 04/2022  - draining well, will monitor    This visit was spent addressing a complex, chronic illness that poses a threat to life or bodily function to this patient.  I independently reviewed the patient's history since prior visit, reviewed intervening labs and imaging, counseled the patient on the management of their chronic illness, and placed orders to be done prior to next visit.     Jaxxton has advanced urothelial carcinoma, currently on erdafitinib that has a risk of causing harm and needs monitoring of cbc, LFTs, electrolytes via labs tests every 2 weeks    Stephen [name]. Roseanne Reno, M.D.  Assistant Professor of  Medicine   [employer] [place], [facility]  [facility]  [address]  [place], [place] [address]

## 2023-10-08 ENCOUNTER — Encounter (HOSPITAL_BASED_OUTPATIENT_CLINIC_OR_DEPARTMENT_OTHER): Payer: Self-pay | Admitting: Hematology & Oncology

## 2023-10-09 ENCOUNTER — Other Ambulatory Visit: Payer: Self-pay

## 2023-10-09 ENCOUNTER — Encounter (HOSPITAL_BASED_OUTPATIENT_CLINIC_OR_DEPARTMENT_OTHER): Payer: Self-pay

## 2023-10-09 ENCOUNTER — Other Ambulatory Visit (HOSPITAL_BASED_OUTPATIENT_CLINIC_OR_DEPARTMENT_OTHER): Payer: Self-pay

## 2023-10-09 ENCOUNTER — Inpatient Hospital Stay (HOSPITAL_BASED_OUTPATIENT_CLINIC_OR_DEPARTMENT_OTHER): Admit: 2023-10-09 | Discharge: 2023-10-09 | Disposition: A | Discharge status: Home Health | Payer: Self-pay

## 2023-10-09 ENCOUNTER — Ambulatory Visit: Payer: Medicare Other | Attending: Family Medicine | Admitting: Family Medicine

## 2023-10-09 VITALS — BP 116/63 | HR 64 | Temp 97.0°F | Resp 16 | Ht 66.14 in | Wt 168.5 lb

## 2023-10-09 DIAGNOSIS — Z9181 History of falling: Secondary | ICD-10-CM | POA: Insufficient documentation

## 2023-10-09 DIAGNOSIS — R682 Dry mouth, unspecified: Secondary | ICD-10-CM | POA: Insufficient documentation

## 2023-10-09 DIAGNOSIS — G4700 Insomnia, unspecified: Secondary | ICD-10-CM | POA: Insufficient documentation

## 2023-10-09 DIAGNOSIS — C7910 Secondary malignant neoplasm of unspecified urinary organs: Secondary | ICD-10-CM | POA: Insufficient documentation

## 2023-10-09 DIAGNOSIS — C7951 Secondary malignant neoplasm of bone: Secondary | ICD-10-CM | POA: Insufficient documentation

## 2023-10-09 DIAGNOSIS — R413 Other amnesia: Secondary | ICD-10-CM | POA: Insufficient documentation

## 2023-10-09 DIAGNOSIS — T402X5A Adverse effect of other opioids, initial encounter: Secondary | ICD-10-CM | POA: Insufficient documentation

## 2023-10-09 DIAGNOSIS — M5412 Radiculopathy, cervical region: Secondary | ICD-10-CM | POA: Insufficient documentation

## 2023-10-09 DIAGNOSIS — G893 Neoplasm related pain (acute) (chronic): Secondary | ICD-10-CM | POA: Insufficient documentation

## 2023-10-09 DIAGNOSIS — M47812 Spondylosis without myelopathy or radiculopathy, cervical region: Secondary | ICD-10-CM | POA: Insufficient documentation

## 2023-10-09 DIAGNOSIS — Z515 Encounter for palliative care: Secondary | ICD-10-CM | POA: Insufficient documentation

## 2023-10-09 DIAGNOSIS — F419 Anxiety disorder, unspecified: Secondary | ICD-10-CM | POA: Insufficient documentation

## 2023-10-09 DIAGNOSIS — C662 Malignant neoplasm of left ureter: Secondary | ICD-10-CM | POA: Insufficient documentation

## 2023-10-09 DIAGNOSIS — R5383 Other fatigue: Secondary | ICD-10-CM | POA: Insufficient documentation

## 2023-10-09 DIAGNOSIS — K5903 Drug induced constipation: Secondary | ICD-10-CM | POA: Insufficient documentation

## 2023-10-09 DIAGNOSIS — K1230 Oral mucositis (ulcerative), unspecified: Secondary | ICD-10-CM | POA: Insufficient documentation

## 2023-10-09 DIAGNOSIS — Z9189 Other specified personal risk factors, not elsewhere classified: Secondary | ICD-10-CM | POA: Insufficient documentation

## 2023-10-09 MED ORDER — NYSTATIN 100000 UNIT/ML MT SUSP
5.0000 mL | Freq: Four times a day (QID) | OROMUCOSAL | 0 refills | Status: DC
Start: 2023-10-09 — End: 2023-10-23
  Filled 2023-10-09: qty 280, 14d supply, fill #0

## 2023-10-09 MED ORDER — PREGABALIN 100 MG OR CAPS
100.0000 mg | ORAL_CAPSULE | Freq: Two times a day (BID) | ORAL | Status: DC
Start: 2023-10-09 — End: 2023-10-23

## 2023-10-09 MED ORDER — LIDOCAINE VISCOUS 2 % MT SOLN
OROMUCOSAL | 2 refills | Status: DC
Start: 2023-10-09 — End: 2023-11-06
  Filled 2023-10-09: qty 100, 4d supply, fill #0
  Filled 2023-10-09: qty 200, 6d supply, fill #0

## 2023-10-09 MED ORDER — HYDROMORPHONE HCL 2 MG OR TABS
ORAL_TABLET | ORAL | 0 refills | Status: DC
Start: 2023-10-09 — End: 2023-10-23
  Filled 2023-10-09: qty 60, 5d supply, fill #0

## 2023-10-09 MED ORDER — ALUM & MAG HYDROXIDE-SIMETH 200-200-20 MG/5ML OR SUSP
ORAL | 2 refills | Status: AC
Start: 2023-10-09 — End: ?
  Filled 2023-10-09: qty 355, 6d supply, fill #0

## 2023-10-09 MED ORDER — DIPHENHYDRAMINE HCL 12.5 MG/5ML OR LIQD
ORAL | 2 refills | Status: DC
Start: 2023-10-09 — End: 2023-11-06
  Filled 2023-10-09: qty 236, 4d supply, fill #0

## 2023-10-09 MED ORDER — SENNA 8.6 MG OR TABS
17.2000 mg | ORAL_TABLET | Freq: Two times a day (BID) | ORAL | 2 refills | Status: DC
Start: 2023-10-09 — End: 2023-11-06
  Filled 2023-10-09: qty 120, 30d supply, fill #0

## 2023-10-09 MED ORDER — LIDOCAINE 5 % EX PTCH
1.0000 | MEDICATED_PATCH | CUTANEOUS | 2 refills | Status: DC
Start: 2023-10-09 — End: 2024-01-10
  Filled 2023-10-09: qty 30, 30d supply, fill #0

## 2023-10-09 NOTE — Progress Notes (Signed)
[facility] OUTPATIENT PAVILION / Medical Oncology Social Work Telephone note:    Current status:  Met with Stephen [name] in-person after his appt with Dr. Logan Bores to provide ongoing emotional support and follow up on Cancer Care grant for bladder cancer.  We spoke for ~ 15 minutes.  He was receptive.    Stephen [name] remains independent with scheduling and coordinating all his appts.  He admits to being fatigued and concerned about his future.  While he hopes for the best outcome, he is asking about hospice, etc.  I briefly review hospice (which he knows about services b/c of his parents being on hospice before their deaths) but caution him that majority of hospices are home based.  He v/u but would still prefer to be inpatient b/c his sister's home is small.  But, he is realistic and will accept whatever services hospice provides.  I also briefly consulted with Dr. Logan Bores about this.    Intervention:  Provided Stephen [name] with supportive counseling and validation of feelings.  Attempted to instill hope.    Resource follow up:  Stephen [name] signed his Cancer Care grant application for assistance with transportation.  It was then faxed to: Cancer Care fax # [phone]. Confirmation receipt received.  Will f/u next week about status of grant.    Plan:  Stephen [name] has an appt with Horton Finer, NP, of [facility] on 10/30.  Stephen [name] to meet with Dr. Logan Bores on 11/18.  Will continue to follow.    Kingsley Plan, LCSW  [facility] Social Worker  [phone]

## 2023-10-09 NOTE — Progress Notes (Signed)
[facility] Service Palliative Care Note     Reason for visit: Follow-up  Pain management     Diagnosis: MN of urinary bladder      Advanced Care Planning: On File  Surrogate Decision Maker:  Colman Cater, sister  [phone]    Assessment:     Patient Orientation: A&O x 4, answers questions readily and appropriately with recent and remote memories intact    CC:  Constipation  - current laxative not working   - neck pain - waiting for x-ray to be scheduled    General:    - "I lost my gold bracelet on my way here"  - uses FWW gel  - he does not take ibuprofen, zyloprim, voltaren 1% gel  - request refill of dilaudid, [facility]     Pain Assessment:    Location: lower back, shoulders, neck  Quality: patient did not provide   Severity:      Current pain score 1-10:  8      Worst pain score 1-10: 10    Duration/Timing: constant   Modifying factors       Aggravating factors: movement        Alleviating factors: patient did not provide     Pain Medication:   1) tylenol 500 mg 2 tabs 2x/day  2) cymbalta 200 mg CR daily   3) hydromorphone 2 mg taking 2 tabs every 4 hours   4) lyrcia 100 at night      Nausea/vomiting:    - Denies N/V    Medication:   1) compazine as ordered     Appetite:   - appetite varies, taking nephro liquid for supplement 2x/day     GI:  Bowel Movements:   - Consistency:  constipation, small bm yesterday.  State he is not eating well     - report he takes the laxative but not working out well.      Medication:   1) senna as ordered   2) lactulose as ordered  3) glycolax as ordered.     Anxiety/depression:   - appeared anxious - lost his gold bracelet on his way in the clinic     Medication:   - buspar 15 mg TID    Sleep/Fatigue   - awake every 2 hours to empty the nephrostomy pouch  - he nap during the day.      Medication:   1) trazadone 50 mg at noc.     Well Being Assessment:   -Patient's needs currently being addressed.  Patient's affect/mood appropriate for situation.     Education: Gaffer and verbal instruction provided regarding plan of care.   Barriers to learning assessed: No barriers noted. Patient verbalized understanding of teaching and instructions.     Contact information, AVS given and reviewed related to plan of care. Questions answered to patient's satisfaction and verbalized understanding. Patient/family aware to call with any questions or concerns that may arise.    Georjean Mode, LVN

## 2023-10-09 NOTE — Patient Instructions (Addendum)
[facility] Palliative Care       PLAN OF CARE:    -Continue hydromorphone to 2mg  2 tablets (4mg ) by mouth every 4 hours as needed for pain.  I recommend you try taking this around the clock for pain.  -I sent a prescription for lidocaine 5% patches for you.  Wear for 12 hours and take off for 12 hours each day  -Continue duloxetine CR (Cymbalta) 20mg  by mouth 2 times a day  -Continue pregabalin (Lyrica) 100mg  by mouth 2 times a day  -We will increase Senna to 2 tablets by mouth 2 times a day  -Continue docusate 100mg  by mouth twice a day as needed for constipation  -Continue Miralax 1 tablespoon in water daily as needed for constipation  -Continue lactulose 15 milliliters to 30 milliliters by mouth 3 times daily as needed for constipation  -Continue Buspar to 15mg  by mouth 3 times a day  -Continue prochlorperazine 10mg  by mouth every 6 hours as needed for nausea  -We will start magic mouthwash.  I also prescribed you an anti-fungal mouthwash (Nystatin)  -Continue follow up with Psychiatry (establishing with new provider soon) - let us know if you need help scheduling this     We will see you for follow up on 11/06/23 at 10 AM in person at [facility] (60 minutes)      List of Palliative Care Providers:  Theda Belfast, NP  Gaspar Skeeters, NP  Margarita Mail, MD  Davy Pique, NP  Vivien Presto, NP  Novella Rob, MD  Berenice Primas, MD  Clint Guy MD  Era Skeen, DO FAAHPM  Awilda Metro, MD    Administrative Assistant/Team Coordinator   Jeannetta Ellis   Alcide Clever     Social Workers:   Carleene Mains, [place]    Spiritual Counselor/Chaplain  Lehman Prom, [facility]   Dan Europe, [place] CME     Nurse Case Managers  Sherald Barge, RN   Caralee Ates, RN  Bonner Puna, RN   Amie Bronson Curb, LVN  Lucilla Lame, [place]  .............................................................................................................................................      HOW TO CONTACT PALLIATIVE CARE  NURSE  LINE--------[phone]--leave a voicemail and we will call you back  SCHEDULING------[phone]  FAX--------------------[phone]   [place]------Use MyChart Messaging for non-emergent issues  ?   HOURS: Monday through Friday 8:00 am - 5:00 pm?(closed holidays and weekends)      AFTER HOURS: For urgent symptom issues after hours, call [phone] and ask for the Doctor-on-Call for Palliative Care.   ?   Emergency: Call 911 or go to the nearest emergency room.?   .............................................................................................................................................   ?  [place] Help Desk: [phone]      ............................................................................................................................................      Special Instructions Regarding Medications:   ?   1. Prescription Refills       -4 days before running out of your medication, NOTIFY us                 Use [phone] or via MyChart message.                 We can Mail you palliative care prescriptions if you like, they are managed by the [place] mail order pharmacy                Please let us know 4 business days in advance of running out    Mail order refills are ONLY delivered Monday-Friday (no holidays)     We will need to know the delivery address  Someone will need to be home to sign for the medication (will need to show drivers license and sign for delivery)    2. -Pain medication Agreement                Pain medications to treat cancer-relate pain should only be prescribed by [facility] palliative care unless you are in the ER               We must see you regularly to assess you and safely prescribe               If you miss an appointment or need to cancel/reschedule please call us right away at [phone]  ?   2. Storage                Medicines should be stored in the original bottles in a cool, dry place.                 Please keep them securely out of  reach of children and pets.   ?   3. Disposal: Please dispose of your medicines safely. Here is a link to safe disposal sites in [place]: http://trevino.com/.php    ?   4. Precaution: Please keep all medications out of reach of children.   ?   .............................................................................................................................................?   Financial Counselors  [phone] [facility]  [phone] or [phone]     Advance Care Planning   ?   Many patients ask about the differences between an Advance Directive and a POLST.       Here is a Lobbyist that clarifies the differences between these documents:    [email or web address] ?      Differences between an Advance Directive and a POLST are:    An Advance Directive is for:    1. Anyone 79 years of age or older    2. Provides instructions for future treatment    3. Appoints a health care representative    4. Guides inpatient treatment decisions when make available       A POLST is for:    1. Anyone with a serious illness - any age   2. Provides medical orders for current treatment    3. Guides actions by Emergency Medical Personnel when made available    4. Guides inpatient treatment decisions when made available   5. This is a bright pink form that should be placed on your refrigerator    .............................................................................................................................................      Patient Experience Department   ?   The Patient Experience Department acts as a bridge between our patients, hospitals and physicians to respond to concerns, issues and requests. Patient experience specialists are here to help patients and their families communicate their experiences with [facility] and navigate our health system. Please call [phone] or send an email  to [email or web address].    ..............................................Marland Kitchen     MyPath     We are excited to share our new [facility] application: MyPath. MyPath will guide you through your unique cancer journey and connect you with our expansive network of curated support services and resources. It is available in [place] and [place].     You can download the MyPath application in four easy steps:     1.       Use the QR code to open your app store.  2.       Click on the MyPath app icon and install  on your device.  3.       Open the MyPath app.  4.       Enter your [place] username and password to log in.

## 2023-10-09 NOTE — Progress Notes (Signed)
[facility] Service Outpatient Progress Note    Requested by Dr. Brock Ra    CC: Pain, psychosocial support    ID: Stephen [name] is a 72 year old male with a history of metastatic urothelial carcinoma, OA, HTN, MDD, gout referred to Palliative Care clinic for symptom management and psychosocial support.    Interval Events:  -Stephen [name] had a follow up with Oncology (Dr. Roseanne Reno) on 10/05/23.  Per review of note, planned to continue treatment with erdafitinib and to get XRT to R shoulder  -Stephen [name] had an evaluation with Orthopedics (Dr. Christa See), planned for CT C-spine and CT L-spine, scoliosis X-rays and PT referral    Subjective:    Today we met with patient in palliative care clinic.  We addressed several issues during his visit:     #) Pain: See description below.   Location: Low back, shoulder, neck  Quality: Sharp, aching  Severity:   Current pain score: 8  Worst pain score: 10  Duration/Timing: constant  Modifying factors       Aggravating factors: sitting down, activities, movement       Alleviating factors: tizanidine, hydromorphone, pregabalin    -Not taking Ibuprofen  -Takes acetaminophen 500mg  PO q8h PRN pain  -Taking hydromorphone 2mg  1-2 tablets (2mg  to 4mg ) PO q4h PRN, taking 2 tablets every 4 hours, "I think it might work but I don't know."  Needs refill of this today.    -Taking tizanidine 2mg  PO q8h PRN muscle spasms, does not take every day but when he does take it, seems to help  -Taking pregabalin 100mg  PO BID  -Takes duloxetine CR 20mg  PO BID  -Also has followed with Pain Management, recently saw Orthopedics and pending CT L-spine and CT C-spine, scoliosis X-rays  -Denies any adverse effects of the hydromorphone, pregabalin, duloxetine or tizanidine such as sedation, lethargy, myoclonus or pruritus.  Not currently having falls but in the past has reported issues with frequent falls, which we need to monitor closely    #) Bowel movements:  -Has constipation, "really bad lately"  -Lactulose, Senna,  Miralax have not been helping  -Taking Senna 1 tablet PO BID, open to increasing dose to see if this helps, feels constipation is more of a motility issue as opposed to being a stool consistency issue  -Does not take Miralax and lactulose right now, was not helpful, does not want to restart them, prefers to increase dose of Senna  -Discussed importance of consistently taking his laxatives and stool softeners    #) Nausea:  -Has nausea at times but denies nausea/vomiting today  -Takes prochlorperazine 10mg  PO q6h PRN for nausea, still helpful when he needs it    #) Nutrition and dysphagia:  -Drinking renal nutrition shakes 2 times per day  -Mainly eats soft foods due to not having teeth or dentures  -Per review of Speech Therapy note, 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 Attributes this to his erdafitinib  -Has not noticed any white discharge in his oral cavity or throat  -Also noticing significant dry mouth, does not want to discontinue any of the medications he is taking that may be causing dry mouth as he feels they are important for pain and symptom management  -  Taking Biotene PRN, lozenges PRN but not helpful  -Open to trying magic mouthwash for pain control    #) Insomnia:  -Taking trazodone 50mg  PO nightly, wakes up frequently to change out nephrostomy bag    #) Fatigue:  -Continues to feel quite fatigued most days, able to talk short walks with his walker with a seat, have suggested wheelchair in the past but patient adamantly declined that.   No changes in this today.    -Denies any new falls since last visit, but at last visit was reporting frequently falls, will need to monitor closely    #) Nephrostomy tube:  -Has  nephrostomy tube exchange on 10/26/23  -Urine appears clear today  -No fevers, chills, abdominal pain, flank pain reported today    #) Anxiety:  -Having anxiety today, worries about his teeth and dry mouth, and also very stressed as he lost a gold bracelet today while walking from XRT to [facility].  Encouraged him to check with lost and found on this, I will also try to call security to see if they can help  -Taking Buspar 15mg  PO TID  -Takes trazodone 50mg  PO nightly  -Stopped taking hydroxyzine, states it didn't help with anxiety in the past  -Will avoid stronger anxiolytics such as lorazepam given hx of memory problems, frequent falls and hx of possible opioid overdose unless assessed to be indicated per Psychiatry    #) Urothelial carcinoma:  -Currently on treatment with erdafitinib    #) Psychosocial domain:  -Patient's sister is on a trip to [place] to see her grandchildren and family  -Patient's niece currently lives with him although not closely involved in his life  -Adamantly declines wheelchair, has declined increased home support  -Sister does help him manage medications, I will call her to touch base later this week  -Sister's mother-in-law died recently    #) Advance care planning:  -Health care agent: Stephen [name] (sister)  -Has advance directive on file from 2019 which is notarized.      ROS: Symptom Assessment      07/27/2023     5:52 PM 07/20/2023     3:38 PM 07/06/2023    10:14 AM 06/29/2023     9:53 AM 06/26/2023     8:00 AM 06/08/2023    12:01 PM 06/01/2023     1:47 PM   Palliative Care Symptom Assessment   Pain Level     5     Constipation     5     Tiredness     3     Nausea     0     Depression     9     Anxiety     9     Drowsiness     6     Appetite     4     Wellbeing     8     Dyspnea     0     Insomnia     3     ECOG 2 2 2 2  2 2        Additional ROS reviewed in detail and negative:  Review of Systems   Constitutional:  Positive for appetite change, fatigue and unexpected weight change.  Negative for chills, diaphoresis and fever.   HENT:   Positive for mouth sores and trouble swallowing. Negative for sore throat and voice change.    Eyes:  Negative for icterus.   Respiratory:  Negative for chest tightness, cough, hemoptysis, shortness of breath and wheezing.    Cardiovascular:  Negative for chest pain, leg swelling and palpitations.   Gastrointestinal:  Positive for constipation. Negative for abdominal pain, blood in stool, diarrhea, nausea and vomiting.   Genitourinary:  Negative for dysuria, frequency and hematuria.    Musculoskeletal:  Positive for arthralgias, back pain, gait problem and neck pain.   Skin:  Negative for rash.   Neurological:  Positive for extremity weakness and gait problem. Negative for light-headedness and speech difficulty.   Psychiatric/Behavioral:  Positive for sleep disturbance. Negative for suicidal ideas. The patient is nervous/anxious.         Outpatient Palliative Regimen:  Acetaminophen 1000mg  PO q8h PRN pain   Duloxetine CR 20mg  PO BID  Prochlorperazine 10mg  PO q6h PRN nausea  Trazodone 50mg  PO nightly   Pregabalin 100mg  PO BID  Miralax 17gm PO BID PRN constipation  Senna-S 1 tablet PO BID   Lidocaine 5% patch 12 hours on / 12 hours off  Diclofenac 1% topical QID PRN pain  Narcan PRN  Ibuprofen 600mg  PRN  Tizanidine 2mg  PO q8h PRN muscle spasms  Loperamide PRN  Lactulose 15mL to 30mL PO TID PRN constipation    Conversion to Oral Morphine Equivalent = 120    Allergies & Reactions: Review of patient's allergies indicates Sulfa drugs    Medications: (reviewed today)  Current Outpatient Medications on File Prior to Visit   Medication Sig Dispense Refill    acetaminophen (TYLENOL) 500 MG tablet Take 2 tablets (1,000 mg) by mouth every 8 hours as needed for Mild Pain (Pain Score 1-3) or Moderate Pain (Pain Score 4-6). 40 tablet 0    albuterol (PROAIR HFA) 108 (90 Base) MCG/ACT inhaler ProAir HFA 90 mcg/actuation aerosol inhaler      allopurinol (ZYLOPRIM) 100 MG tablet  Take 1 tablet (100 mg) by mouth daily. 30 tablet 0    amoxicillin (AMOXIL) 500 MG capsule Take 1 capsule (500 mg) by mouth 3 times daily. 21 capsule 0    Artificial Tear Solution (SOOTHE XP OP)       busPIRone (BUSPAR) 15 MG tablet Take 1 tablet (15 mg) by mouth 3 times daily. 90 tablet 2    diclofenac (VOLTAREN) 1 % gel Apply 2 g topically every 6 hours as needed (Right knee pain). 100 g 0    DULoxetine (CYMBALTA) 20 MG CR capsule Take 1 capsule (20 mg) by mouth 2 times daily. 180 capsule 2    Erdafitinib 4 MG TABS Take 8 mg by mouth daily. 60 tablet 3    fluticasone propionate (FLONASE) 50 MCG/ACT nasal spray 2 sprays.      [DISCONTINUED] HYDROmorphone (DILAUDID) 2 MG tablet Take 1 to 2 tablets (2mg  to 4mg ) by mouth every 4 hours as needed for moderate to severe pain 60 tablet 0    ibuprofen (MOTRIN) 600 MG tablet       ketotifen (ALAWAY) 0.025 % ophthalmic solution Place 1 drop into both eyes 2 times daily.      lactulose 10 GM/15ML solution Take 15mL to 30mL by mouth 3 times a day as needed for constipation (makes stools softer) 473 mL 2    loperamide (IMODIUM) 2 MG capsule For mild to moderate diarrhea (less than 5 BMs within 24 hours):  Take 1 capsule (2 mg) by mouth 4 times daily as needed for Diarrhea.  For severe Diarrhea (5 or more medium to large BMs within 24 hours):  take 2 capsules and  then 1 capsule every 2 hours until diarrhea stops. 90 capsule 0    loratadine (CLARITIN REDITABS) 10 MG dissolvable tablet       LORazepam (ATIVAN) 0.5 MG tablet Take 1 tablet by mouth 30 to 60 minutes prior to MRI.  May repeat dose once if needed. 2 tablet 0    Multiple Vitamins-Minerals (MULTIVITAMIN WITH MINERALS) TABS tablet Take 1 tablet by mouth daily. 30 tablet 1    naloxone (NARCAN) 4 mg/0.1 mL nasal spray For suspected opioid overdose, call 911! Then spray once in one nostril. Repeat after 3 minutes if no or minimal response using a new spray in other nostril. 2 each 0    Nutritional Supplements (NEPRO) LIQD  Take 3 Cans by mouth daily. 30000 mL 11    polyethylene glycol (MIRALAX) 17 g packet Take 1 packet (17 g) by mouth 2 times daily as needed for Constipation. 1 each 0    pregabalin (LYRICA) 100 MG capsule Take 1 capsule (100 mg) by mouth 2 times daily.      [DISCONTINUED] pregabalin (LYRICA) 100 MG capsule Take 1 capsule (100 mg) by mouth at bedtime. 30 capsule 0    prochlorperazine (COMPAZINE) 10 MG tablet Take 1 tablet (10 mg) by mouth every 6 hours as needed (Nausea/Vomiting). 30 tablet 5    [DISCONTINUED] senna (SENOKOT) 8.6 MG tablet Take 2 tablets (17.2 mg) by mouth 2 times daily. 30 tablet 0    tamsulosin (FLOMAX) 0.4 MG capsule TAKE 1 CAPSULE BY MOUTH EVERY DAY 90 capsule 1    tizanidine (ZANAFLEX) 2 MG tablet Take 1 tablet (2 mg) by mouth every 8 hours as needed (muscle spasm). 30 tablet 2    traZODone (DESYREL) 50 MG tablet Take one tab nightly by mouth at bedtime as needed for insomnia 90 tablet 0     No current facility-administered medications on file prior to visit.       Past Medical History:  Past Medical History:   Diagnosis Date    Chronic back pain     Congenital hydronephrosis     Gout     Headache     Hematuria     HTN (hypertension) 12/10/2021    Kidney disease     Kidney stones     Major depressive disorder, single episode     Polyarthropathy or polyarthritis of multiple sites     Retinal detachment     Urethral stricture        Past Surgical History:  Past Surgical History:   Procedure Laterality Date    CT INSERTION OF SUPRAPUBIC CATH  09/25/2015    NEPHRECTOMY Right 1955    APPENDECTOMY      COLONOSCOPY      CYSTOSCOPY      CYSTOSCOPY W/ LASER LITHOTRIPSY      OTHER SURGICAL HISTORY      Interstim 01/29/2011    SPINE SURGERY  09/21    Lumbar-sacral fusion    TRANSURETHRAL RESECTION OF PROSTATE         Social History:  Healthcare agent/surrogate decision-maker: Stephen [name] (sister)  Social History     Social History Narrative    Not on file        Family History:  Family History   Adopted:  Yes   Family history unknown: Yes       PEX:  Vitals:    10/09/23 0859   BP: 116/63   BP Location: Left arm   BP Patient Position: Sitting   BP  cuff size: Regular   Pulse: 64   Resp: 16   Temp: 97 F (36.1 C)   TempSrc: Temporal   SpO2: 99%   Weight: 76.4 kg (168 lb 8 oz)   Height: 5' 6.14" (1.68 m)       General: No acute distress, thin, sitting in chair  HEENT: PERRLA, EOMI, no conjunctivitis or scleral icterus noted.     Neck:  Trachea midline.  Lungs: Clear to auscultation bilaterally, no wheezes, no rhonchi, no rales  CV: Regular rate and rhythm, no murmurs, no rubs, no gallops  Abdomen: Soft, nondistended, nontender to palpation, no rebound or guarding, normoactive bowel sounds in all four quadrants, no CVAT, +nephrostomy tube noted with clear urine in bag on L side  Neuro: Oriented x3, no dysarthria, moving all four extremities spontaneously, 5/5 strength throughout.  Memory appears improved today (able to tell me exactly which medications he is and isn't taking)  Psych: Thought process linear and goal-directed, thought content appropriate, affect full range and appropriate, denies suicidal ideation, speech is pressured at times and he is reporting significant anxiety today.  No evidence of hallucinations or delusions  Skin: Warm and well perfused.  Brisk capillary refill.      Pertinent Labs:     Lab Results   Component Value Date    WBC 6.5 09/29/2023    RBC 3.83 (L) 09/29/2023    HGB 11.4 (L) 09/29/2023    HCT 34.2 (L) 09/29/2023    MCV 89.3 09/29/2023    MCHC 33.3 09/29/2023    RDW 15.6 (H) 09/29/2023    PLT 197 09/29/2023    MPV 10.0 09/29/2023       Lab Results   Component Value Date    BUN 27 (H) 09/29/2023    CREAT 1.35 (H) 09/29/2023    CL 101 09/29/2023    NA 139 09/29/2023    K 4.4 09/29/2023    [place] 9.7 09/29/2023    TBILI 0.93 09/29/2023    ALB 4.0 09/29/2023    TP 7.2 09/29/2023    AST 325 (H) 09/29/2023    ALK 267 (H) 09/29/2023    BICARB 29 09/29/2023    ALT 453 (H) 09/29/2023    GLU 97  09/29/2023       Above labs reviewed    Pertinent Diagnostic Study Findings:  CT Chest with contrast 08/22/23  IMPRESSION:  Findings of increased trending multi compartmental metastatic disease of the thorax.     Increased size of scattered pulmonary nodules suspicious for increased trending pulmonary metastatic disease.     Stable to increase in size of multi station thoracic adenopathy worrisome for increased nodal disease.     Increased size and distribution of osseous metastatic bone disease from prior study.     New focal ground-glass in the right upper lobe is most likely infectious or inflammatory in nature. Attention on follow-up imaging recommended.     Additional ancillary findings as above. Please refer to concurrent CT abdomen/pelvis for description of findings below the diaphragm.    CT Urogram 08/22/23  IMPRESSION:  Stable osseous metastases when compared to the prior examination. No evidence of disease progression or new metastatic disease.     The bladder is decompressed and unable to be evaluated. If there is further concern, consider direct visualization.     Colon containing left inguinal hernia without evidence obstruction.    MRI Thoracic Spine 08/19/23  IMPRESSION:  Diffuse metastatic disease of the thoracic spine corresponding to  sclerotic lesions on CT, some with associated mild epidural enhancement as above, however without significant spinal canal stenosis, compression of the cord, or abnormal cord signal.     No evidence of pathologic fracture.    Above findings reviewed    ASSESSMENT: Rajendra Stancato is a 72 year old male with a history of metastatic urothelial carcinoma, OA, HTN, MDD, gout referred to Palliative Care clinic for symptom management and psychosocial support.    Medical chart reviewed in detail.  Above labs reviewed and notable for anemia, elevated creatinine (Cr 1.35), elevated AST/ALT, elevated alkaline phosphatase  Above imaging reviewed personally and notable for left  multi-compartmental metastatic disease of the thorax, increased size of scattered pulmonary nodules, stable osseous metastases, left inguinal hernia with no obstruction, diffuse metastatic disease of the thoracic spine      Diagnoses and all orders for this visit:    Metastatic urothelial carcinoma (CMS-HCC)    Pain from bone metastases (CMS-HCC)  -     HYDROmorphone (DILAUDID) 2 MG tablet; Take 1 to 2 tablets (2mg  to 4mg ) by mouth every 4 hours as needed for moderate to severe pain    Cancer related pain  -     HYDROmorphone (DILAUDID) 2 MG tablet; Take 1 to 2 tablets (2mg  to 4mg ) by mouth every 4 hours as needed for moderate to severe pain  -     lidocaine (LIDODERM) 5 % patch; Apply 1 patch topically every 24 hours. Leave patch on for 12 hours, then remove for 12 hours.    Cervical radiculopathy    Cervical spondylosis without myelopathy    Therapeutic opioid induced constipation  -     senna (SENOKOT) 8.6 MG tablet; Take 2 tablets (17.2 mg) by mouth 2 times daily.    Fatigue, unspecified type    Insomnia, unspecified type    At risk for nausea    Memory problem    History of fall    Dry mouth  -     lidocaine (XYLOCAINE) 2 % solution; Mix viscous lidocaine 2%, Mylanta, and Benadryl liquid into a 1:1:1 solution.  Take 10mL by mouth every 4 hours as needed for pain.  Swish and spit or swallow for pain. If swallowed, wait 10 minutes prior to eating/drinking. May also apply medication to a Q-tip and dab on areas of sores.  -     diphenhydrAMINE (BENADRYL) 12.5 MG/5ML liquid; Mix viscous lidocaine 2%, Mylanta, and Benadryl liquid into a 1:1:1 solution.  Take 10mL by mouth every 4 hours as needed for pain.  Swish and spit or swallow for pain. If swallowed, wait 10 minutes prior to eating/drinking. May also apply medication to a Q-tip and dab on areas of sores.  -     aluminum-magnesium-simethicone (MAG-AL PLUS) 200-200-20 MG/5ML suspension; Mix viscous lidocaine 2%, Mylanta, and Benadryl liquid into a 1:1:1 solution.   Take 10mL by mouth every 4 hours as needed for pain.  Swish and spit or swallow for pain. If swallowed, wait 10 minutes prior to eating/drinking. May also apply medication to a Q-tip and dab on areas of sores.    Mucositis  -     nystatin (MYCOSTATIN) 100,000 units/mL suspension; 5 mL (500,000 Units) by Swish & Swallow route 4 times daily.    Anxiety    Palliative care by specialist                    PLAN:  - continue hydromorphone to 2mg  2 tablets (4mg ) PO  q4h PRN severe pain.  Patient has a history of opioid overdose, recommend avoiding long acting opioids at this time.  If pain not relieved with taking hydromorphone around the clock at this dose, may consider Fentanyl patch if patient's sister willing to help him manage it safely.  Patient counseled to take as prescribed (currently he is not taking very often and not taking prescribed dose, taking much less)  - continue acetaminophen 1000mg  PO q8h PRN pain  - continue duloxetine CR 20mg  PO BID to target pain and anxiety, monitor carefully for signs/symptoms of serotonin syndrome  - continue pregabalin 100mg  PO BID, monitor renal function closely.    - continue tizanidine at dose of 2mg  PO q8h PRN muscle spasms.    - start lidocaine 5% patches 12 hours on / 12 hours off  - start magic mouthwash PRN  - start Nystatin mouthwash per patient request for possible developing oral thrush  - have discontinued hydroxyzine, holding this right now as patient is at high risk of falls and he also did not feel it was helpful  - continue Buspar 15mg  PO TID  - CURES reviewed  - Narcan PRN prescribed at previous visit and we discussed how/when to use in emergencies.  Confirmed that patient has this at home.  - increase Senna to 2 tablets PO BID (encouraged Stephen [name] to make sure to take this)  - continue Miralax 17gm PO BID PRN constipation  - continue lactulose 15mL to 30mL PO TID PRN constipation  - continue prochlorperazine 10mg  PO q6h PRN nausea  - continue trazodone 50mg   PO  nightly PRN insomnia.   - continue follow up with Pain Management  - Continue Nutrition follow up  - Pending Psychiatry follow up  - Referred to Memory Clinic to address chronic memory issues but noted there is no appointment today.  Reminded him about this again today, may reach out to his sister again about this  - Patient adamantly declines IHSS or home based palliative care or physical therapy.  Declines wheelchair.  - please mychart message or call the palliative care clinic regarding symptom management    Follow up in palliative care clinic on 11/06/23 at 10 AM in person at [facility] (60 minutes)    Patient evaluated with the following [facility] Service Members:  Georjean Mode, LVN  Lindalou Hose, MD (Addiction Psychiatry fellow)    Thank you for your consult.  We will continue to follow this patient and provide longitudinal care, symptom management and psychosocial support.    Shea Demon Volante, MD  Assistant Professor  [facility] Palliative Care Service  [email or web address]    Total Attending time 55 min:  55 min spent performing physical examination, counseling patient, and coordinating their care with medical team.

## 2023-10-09 NOTE — Progress Notes (Signed)
Refill Reminder    Indication: ONCOLOGY/HEMATOLOGY  Medication: balversa     Methods of HIPAA Verification (select 2): Name and DOB    Changes Since Last Visit: None    Is patient ready to refill? Yes, refill submission from pharmacy.      Has the patient reported experiencing any of the following? N/A    Medication Adherence    Patient reported X missed doses in the last month: 0  Informant: patient  Reliability of informant: reliable  Adherence tools used: calendar  Support network for adherence: healthcare provider  Confirmed plan for next specialty medication refill: delivery by pharmacy       [name] has 7 day(s) of medication on hand.  Has Shantel Wiedmaier started or stopped any medications (includes Rx, OTC, herbal supplements) since last refill? no  Please confirm the following: Delivery - Date of Delivery 10/12/2023    Patient Support    Was any additional patient support provided?: No, patient does not need extra support at this time.         Next scheduled lab draw: none  Next scheduled follow up with provider: none  Patient's current quality of life? (1=poor, 10=excellent) N/A  Does patient need to speak with pharmacist? No intervention necessary.

## 2023-10-10 ENCOUNTER — Encounter (HOSPITAL_BASED_OUTPATIENT_CLINIC_OR_DEPARTMENT_OTHER): Payer: Self-pay

## 2023-10-10 ENCOUNTER — Other Ambulatory Visit: Payer: Self-pay

## 2023-10-10 NOTE — Interdisciplinary (Signed)
[facility]-- AVS sent to pt via mychart.

## 2023-10-11 ENCOUNTER — Other Ambulatory Visit: Payer: Self-pay

## 2023-10-11 DIAGNOSIS — C662 Malignant neoplasm of left ureter: Secondary | ICD-10-CM

## 2023-10-11 DIAGNOSIS — C7951 Secondary malignant neoplasm of bone: Secondary | ICD-10-CM

## 2023-10-11 NOTE — Progress Notes (Signed)
[facility] OUTPATIENT PAVILION / Medical Oncology Social Work Telephone note:    [name] application was approved, and a $300 check will be mailed to his home from the [place] on 10/20/23.      Today, I left a voicemail for [employer] him.    Kingsley Plan, LCSW  [facility] Social Worker  [phone]

## 2023-10-11 NOTE — Progress Notes (Signed)
Specialty Pharmacy - Oral Oncology     Subjective   Stephen [name] is a 72 year old male, who is followed by the specialty pharmacy service for [facility].    Start Date:  10/18/2023    Indication:  Urothelial carcinoma  Treatment Plan:   Schedule: Balversa 4mg : Take 2 tablets orally daily.     Medication Adherence    Patient reported X missed doses in the last month: 0  Any gaps in refill history greater than 2 weeks in the last 3 months: no  Demonstrates understanding of importance of adherence: yes  Informant: patient  Reliability of informant: reliable  Provider-estimated medication adherence level: 90-100%  Reasons for non-adherence: no problems identified  Adherence tools used: calendar  Support network for adherence: healthcare provider  Confirmed plan for next specialty medication refill: delivery by pharmacy  Refills needed for supportive medications: not needed       Adverse Effects    Ulcers/sores: Pos  Diarrhea: Pos       Medication Review  Medication review was performed: yes    Drug Interactions    Drug Interactions Evaluated: yes  Clinically Relevant Drug Interactions Identified: yes   Interactions list: Loperamide and Erdafitinib  Loperamide may have side effects increased by Erdafitinib    Monitor for loperamide-associated adverse reactions, such as CNS effects and cardiac toxicities (i.e., syncope, ventricular tachycardia, QT prolongation, torsade de pointes, cardiac arrest), if coadministered with erdafitinib. Concurrent use may increase loperamide exposure. Loperamide is a P-gp substrate and erdafitinib is a P-gp inhibitor. Coadministration with another P-gp inhibitor increased loperamide plasma concentrations by 2- to 3-fold.   Provided the patient with educational material regarding drug interactions: not applicable  Food Interactions Evaluated: no       Objective   Lab Results   Component Value Date    MG 2.0 09/21/2023    NA 139 09/29/2023    K 4.4 09/29/2023    CL 101 09/29/2023    BICARB  29 09/29/2023    BUN 27 (H) 09/29/2023    CREAT 1.35 (H) 09/29/2023    GLU 97 09/29/2023    [place] 9.7 09/29/2023    CPK 650 (H) 05/03/2023    GFRNON 54 10/01/2021    EGFRCKDEPI 57 09/29/2023     Lab Results   Component Value Date    WBC 6.5 09/29/2023    HGB 11.4 (L) 09/29/2023    HCT 34.2 (L) 09/29/2023    PLT 197 09/29/2023    LYMPHS 28.1 09/29/2023    ABSNEUTRO 3.7 09/29/2023    IANC 3.3 07/27/2023     Lab Results   Component Value Date    AST 325 (H) 09/29/2023    ALT 453 (H) 09/29/2023    ALK 267 (H) 09/29/2023    TBILI 0.93 09/29/2023    DBILI 0.2 09/21/2023    TP 7.2 09/29/2023    ALB 4.0 09/29/2023     Assessment & Plan   Indication, effectiveness, safety and convenience of his specialty medication(s) were reviewed today. Based on the information reviewed, therapy is appropriate at this time    Patient Counseling    Counseled the Patient on the Following: patient counseling declined, provided monograph and education materials         Clinical Specialty Pharmacy Goals   Based on continuation of therapy, clinical notes, and [facility] encounters, patient's symptoms are: stable  Based on documented history of reported issues, if applicable, patient is tolerating specialty & associated axillary medication(s) without  adverse effects: Yes  Based on fill history, patient is routinely filling specialty medication(s) & associated axillary medications at the [facility]: Yes  Based on documented clinical notes and [facility] telephone encounters patient is taking/administering specialty & axillary medication(s) properly: Yes      Follow-up: Patient to be re-assessed per PMP Program Protocols  \  Sid Falcon, [facility]  Specialty Pharmacist

## 2023-10-12 ENCOUNTER — Other Ambulatory Visit: Payer: Self-pay

## 2023-10-12 ENCOUNTER — Encounter (HOSPITAL_BASED_OUTPATIENT_CLINIC_OR_DEPARTMENT_OTHER): Payer: Self-pay | Admitting: Hematology & Oncology

## 2023-10-12 NOTE — Interdisciplinary (Signed)
CRC Note:   SID: 04-002 & 04-003  IRB [id number]  [id number]  Re: Screening Labs    Patient 04-002 signed the initial consent for IRB [id number] on 08/15/23 and underwent screening laboratory analysis on 08/24/2023. The patient's creatinine level was outside la eligibility parameters and Dr. Roseanne Reno requested to have the patients screening CMP repeated on 08/29/2023. After the patient's creatinine resulted out or range a second time, the patient was considered a screen fail on 08/29/2023.     Patient re-enrolled under SID 04-003 on 09/08/23, since prior labs were indicating a downward trend in creatinine levels. Unfortunately, the creatinine level resulted on 09/08/23 returned out of range for eligibility once more and Dr. Roseanne Reno confirmed he would be a repeat screen fail.     This note serves to clarify that the labs drawn on 09/04/23 were completed as Standard of Care and not for IRB [phone].       Stephen [name]  Clinical Research Coordinator  [phone], pager [phone]

## 2023-10-17 NOTE — Progress Notes (Signed)
Attending Note:    Subjective:  I reviewed the history.  Patient interviewed and examined.  History of present illness (HPI):  Follow Up ( Stage 1: ALIF L5-S1 ; Stage 2 revision laminectomy and PSIF L3 thru S1, DOS: 8/31, pain)     Review of Systems (ROS): As per the fellow's note.  Past Medical, Family, Social History:  As per the fellow's  note.    Objective:   I have examined the patient and I concur with the fellow's exam.  Assessment and plan reviewed with the fellow. I agree with the fellow's plan as documented.  See the fellow's note for further details.

## 2023-10-18 ENCOUNTER — Encounter (HOSPITAL_BASED_OUTPATIENT_CLINIC_OR_DEPARTMENT_OTHER): Payer: Self-pay | Admitting: Radiation Oncology

## 2023-10-18 ENCOUNTER — Ambulatory Visit: Payer: Medicare Other

## 2023-10-18 ENCOUNTER — Other Ambulatory Visit: Payer: Self-pay

## 2023-10-18 ENCOUNTER — Ambulatory Visit
Admission: RE | Admit: 2023-10-18 | Discharge: 2023-10-18 | Disposition: A | Discharge status: Home / Self Care | Payer: Medicare Other | Attending: Hematology & Oncology | Admitting: Hematology & Oncology

## 2023-10-18 ENCOUNTER — Inpatient Hospital Stay (HOSPITAL_BASED_OUTPATIENT_CLINIC_OR_DEPARTMENT_OTHER): Admit: 2023-10-18 | Discharge: 2023-10-18 | Disposition: A | Discharge status: Home Health | Payer: Self-pay

## 2023-10-18 VITALS — BP 116/63 | HR 50 | Temp 97.2°F | Resp 16 | Ht 66.14 in | Wt 163.8 lb

## 2023-10-18 DIAGNOSIS — C7951 Secondary malignant neoplasm of bone: Secondary | ICD-10-CM | POA: Insufficient documentation

## 2023-10-18 DIAGNOSIS — M7918 Myalgia, other site: Secondary | ICD-10-CM

## 2023-10-18 DIAGNOSIS — G893 Neoplasm related pain (acute) (chronic): Secondary | ICD-10-CM | POA: Insufficient documentation

## 2023-10-18 DIAGNOSIS — Z95828 Presence of other vascular implants and grafts: Secondary | ICD-10-CM | POA: Insufficient documentation

## 2023-10-18 DIAGNOSIS — C689 Malignant neoplasm of urinary organ, unspecified: Secondary | ICD-10-CM | POA: Insufficient documentation

## 2023-10-18 DIAGNOSIS — F4322 Adjustment disorder with anxiety: Secondary | ICD-10-CM | POA: Insufficient documentation

## 2023-10-18 DIAGNOSIS — C679 Malignant neoplasm of bladder, unspecified: Secondary | ICD-10-CM

## 2023-10-18 DIAGNOSIS — F411 Generalized anxiety disorder: Secondary | ICD-10-CM

## 2023-10-18 DIAGNOSIS — F064 Anxiety disorder due to known physiological condition: Secondary | ICD-10-CM | POA: Insufficient documentation

## 2023-10-18 DIAGNOSIS — N1330 Unspecified hydronephrosis: Secondary | ICD-10-CM | POA: Insufficient documentation

## 2023-10-18 DIAGNOSIS — C7910 Secondary malignant neoplasm of unspecified urinary organs: Secondary | ICD-10-CM | POA: Insufficient documentation

## 2023-10-18 DIAGNOSIS — Z5189 Encounter for other specified aftercare: Secondary | ICD-10-CM

## 2023-10-18 LAB — CBC WITH DIFF, BLOOD
ANC-Automated: 3.3 10*3/uL (ref 1.6–7.0)
Abs Basophils: 0 10*3/uL (ref <0.2)
Abs Eosinophils: 0.1 10*3/uL (ref 0.0–0.5)
Abs Lymphs: 1.5 10*3/uL (ref 0.8–3.1)
Abs Monos: 0.5 10*3/uL (ref 0.2–0.8)
Basophils: 0.7 %
Eosinophils: 2.5 %
Hct: 33.6 % — ABNORMAL LOW (ref 40.0–50.0)
Hgb: 11 g/dL — ABNORMAL LOW (ref 13.7–17.5)
Imm Gran %: 0.2 % (ref <1)
Imm Gran Abs: 0 10*3/uL (ref <0.1)
Lymphocytes: 27.5 %
MCH: 29.5 pg (ref 26.0–32.0)
MCHC: 32.7 g/dL (ref 32.0–36.0)
MCV: 90.1 um3 (ref 79.0–95.0)
MPV: 10 fL (ref 9.4–12.4)
Monocytes: 9.1 %
Plt Count: 154 10*3/uL (ref 140–370)
RBC: 3.73 10*6/uL — ABNORMAL LOW (ref 4.60–6.10)
RDW: 14.6 % — ABNORMAL HIGH (ref 12.0–14.0)
Segs: 60 %
WBC: 5.5 10*3/uL (ref 4.0–10.0)

## 2023-10-18 LAB — COMPREHENSIVE METABOLIC PANEL, BLOOD
ALT (SGPT): 435 U/L — ABNORMAL HIGH (ref 0–41)
AST (SGOT): 362 U/L — ABNORMAL HIGH (ref 0–40)
Albumin: 3.9 g/dL (ref 3.5–5.2)
Alkaline Phos: 187 U/L — ABNORMAL HIGH (ref 40–129)
Anion Gap: 11 mmol/L (ref 7–15)
BUN: 20 mg/dL (ref 8–23)
Bicarbonate: 29 mmol/L (ref 22–29)
Bilirubin, Tot: 1.43 mg/dL — ABNORMAL HIGH (ref <1.2)
Calcium: 9.9 mg/dL (ref 8.5–10.6)
Chloride: 98 mmol/L (ref 98–107)
Creatinine: 1.26 mg/dL — ABNORMAL HIGH (ref 0.67–1.17)
Glucose: 88 mg/dL (ref 70–99)
Potassium: 4.4 mmol/L (ref 3.5–5.1)
Sodium: 138 mmol/L (ref 136–145)
Total Protein: 7 g/dL (ref 6.0–8.0)
eGFR Based on CKD-EPI 2021 Equation: >60 mL/min/{1.73_m2}

## 2023-10-18 LAB — PHOSPHORUS, BLOOD: Phosphorous: 6.9 mg/dL — ABNORMAL HIGH (ref 2.7–4.5)

## 2023-10-18 MED ORDER — SODIUM CHLORIDE 0.9 % IJ SOLN (CUSTOM)
20.0000 mL | INTRAMUSCULAR | Status: DC | PRN
Start: 2023-10-18 — End: 2023-10-18
  Administered 2023-10-18: 20 mL via INTRAVENOUS

## 2023-10-18 MED ORDER — HEPARIN SODIUM LOCK FLUSH 100 UNIT/ML IJ SOLN CUSTOM
500.0000 [IU] | INTRAVENOUS | Status: DC | PRN
Start: 2023-10-18 — End: 2023-10-18
  Administered 2023-10-18: 500 [IU] via INTRAVENOUS

## 2023-10-18 MED ORDER — LIDOCAINE HCL (PF) 1 % IJ SOLN
0.3000 mL | INTRAMUSCULAR | Status: DC | PRN
Start: 2023-10-18 — End: 2023-10-18

## 2023-10-18 MED ORDER — BUSPIRONE HCL 10 MG OR TABS
20.0000 mg | ORAL_TABLET | Freq: Two times a day (BID) | ORAL | 2 refills | Status: AC
Start: 2023-10-18 — End: ?
  Filled 2023-10-18: qty 60, 15d supply, fill #0

## 2023-10-18 NOTE — Interdisciplinary (Signed)
Patient arrived ambulatory in stable condition via walker. VS stable. No distress noted. Denies nausea, vomiting, diarrhea, and constipation. Denies pain. States he is eating and hydrating fine. Denies cough, fever, or any recent illness. Patient agreeable to plan of care.    R chest port prepped and accessed per sterile protocol with positive blood return.   20G 3/4 inch Huber needle.   Labs drawn: yes  Biopatch placed: n/a    Port flushed with 20ml NS and 500 units Heparin, Curos capped, Dressing applied.     Patient tolerated well and discharged ambulatory in stable condition to lobby.    Triage Charge/RN to f/u parameters for tx.     Medications   sodium chloride 0.9 % flush 20 mL (20 mL IntraVENOUS Given 10/18/23 0917)   heparin (HEP-LOCK) flush injection 500 Units (500 Units IntraVENOUS Given 10/18/23 0917)   lidocaine (XYLOCAINE) 1% PF injection 0.3 mL (has no administration in time range)

## 2023-10-18 NOTE — Interdisciplinary (Signed)
Patient walked in for L PCN bag change. L PCN bag changed and patient sent home in stable condition.

## 2023-10-18 NOTE — Progress Notes (Signed)
Adult Psychiatry Established Patient [facility] Appt    Patient name: Stephen [name]  Date: Wednesday October 18, 2023    Clinic: [facility]    HISTORY     HPI: Chief Complaint: Anxiety  HPI: Stephen [name] is a 72 year old male with OA, HTN, MDD with L UTUC with metastatic disease ([id number]), with osseous mets (Pet/CT with diffuse enhancement and discrete lesions) who presents to clinic for a psychiatric change of care appointment.     Interim History:   -pt had radition today for his arm, continues to be in pain  -states pain medication does not work very well for back pain, does not seek to increase due to not wanting to be too lethargic  -dissapointed he did not qualify for clinical trial due to elevated creatinine  -feels anxiety is helped by buspar, but feels like anxity has increased recently   -currently living with sister, feels like this is stressful  -has concern about lost bracelet from last visit  -remains future oriented with motivation for treatment to add more time to his life  -Denies SI/HI  -sleep interrupted, has not been taking trazodone as he states he has been forgetting about it. Intends to start using it again when needed   -S/E: denies    Past Psychiatric, Family, & Social History Updates:   Substance use:  No tobacco alcohol illicit drug use  Current work/support:   Limited functioning capacity on account of persistent pain.  The patient reports compliance with BuSpar as prescribed.      Past Medical History:  Patient Active Problem List   Diagnosis    Post-traumatic stricture of anterior urethra    Bladder neck contracture    Lumbar radiculopathy    Cervical radiculopathy    Incomplete bladder emptying    Lumbosacral spondylosis without myelopathy    Chronic SI joint pain    Degenerative spondylolisthesis    Lumbar stenosis with neurogenic claudication    Pre-procedure lab exam    Cervical spondylosis without myelopathy    Cervical spondylosis    S/P lumbar fusion     Chronic left SI joint pain    Chronic midline thoracic back pain    Spondylolisthesis at L5-S1 level    Left renal mass    Malignant neoplasm of urinary bladder, unspecified site (CMS-HCC)    Dorsopathy    Ureter malignant neoplasm, left (CMS-HCC)    Cauda equina compression (CMS-HCC)    COVID-19 virus infection    Community acquired pneumonia    Acute urinary retention    HTN (hypertension)    Gout    MDD (major depressive disorder)    Urothelial cancer (CMS-HCC)    Urinary tract infection associated with indwelling urethral catheter (CMS-HCC)    Ureteral obstruction    Hydronephrosis    Urothelial carcinoma of kidney, left (CMS-HCC)    Myalgia, other site    Trigger point with back pain    Pre-op evaluation    Malignant neoplasm of overlapping sites of bladder (CMS-HCC)    Cancer associated pain    Rhabdomyolysis    Acute kidney injury superimposed on chronic kidney disease (CMS-HCC)    Hypotension    Altered mental status     Review of Systems - Constitutional: fatigue.  Eyes: negative.  Ears, Nose, Mouth, Throat: negative.  CV: negative.  Resp: negative.  GI: negative.  GU: negative.  Musculoskeletal: muscle pain.  Integumentary: itching.  Neuro: negative.  Psych: anxiety.  Endo:  negative.   Heme/Lymphatic: negative.  Allergy/Immun: negative.  All others negative     Allergies:   Allergies   Allergen Reactions    Sulfa Drugs Unspecified, Hives and Other       Medications:  Current Outpatient Medications on File Prior to Visit   Medication Sig Dispense Refill    acetaminophen (TYLENOL) 500 MG tablet Take 2 tablets (1,000 mg) by mouth every 8 hours as needed for Mild Pain (Pain Score 1-3) or Moderate Pain (Pain Score 4-6). 40 tablet 0    albuterol (PROAIR HFA) 108 (90 Base) MCG/ACT inhaler ProAir HFA 90 mcg/actuation aerosol inhaler      allopurinol (ZYLOPRIM) 100 MG tablet Take 1 tablet (100 mg) by mouth daily. 30 tablet 0    aluminum-magnesium-simethicone (MAG-AL PLUS) 200-200-20 MG/5ML suspension Mix viscous  lidocaine 2%, Mylanta, and Benadryl liquid into a 1:1:1 solution.  Take 10mL by mouth every 4 hours as needed for pain.  Swish and spit or swallow for pain. If swallowed, wait 10 minutes prior to eating/drinking. May also apply medication to a Q-tip and dab on areas of sores. 355 mL 2    amoxicillin (AMOXIL) 500 MG capsule Take 1 capsule (500 mg) by mouth 3 times daily. 21 capsule 0    Artificial Tear Solution (SOOTHE XP OP)       [DISCONTINUED] busPIRone (BUSPAR) 15 MG tablet Take 1 tablet (15 mg) by mouth 3 times daily. 90 tablet 2    diclofenac (VOLTAREN) 1 % gel Apply 2 g topically every 6 hours as needed (Right knee pain). 100 g 0    diphenhydrAMINE (BENADRYL) 12.5 MG/5ML liquid Mix viscous lidocaine 2%, Mylanta, and Benadryl liquid into a 1:1:1 solution.  Take 10mL by mouth every 4 hours as needed for pain.  Swish and spit or swallow for pain. If swallowed, wait 10 minutes prior to eating/drinking. May also apply medication to a Q-tip and dab on areas of sores. 236 mL 2    DULoxetine (CYMBALTA) 20 MG CR capsule Take 1 capsule (20 mg) by mouth 2 times daily. 180 capsule 2    Erdafitinib 4 MG TABS Take 8 mg by mouth daily. 60 tablet 3    fluticasone propionate (FLONASE) 50 MCG/ACT nasal spray 2 sprays.      HYDROmorphone (DILAUDID) 2 MG tablet Take 1 to 2 tablets (2mg  to 4mg ) by mouth every 4 hours as needed for moderate to severe pain 60 tablet 0    ibuprofen (MOTRIN) 600 MG tablet       ketotifen (ALAWAY) 0.025 % ophthalmic solution Place 1 drop into both eyes 2 times daily.      lactulose 10 GM/15ML solution Take 15mL to 30mL by mouth 3 times a day as needed for constipation (makes stools softer) 473 mL 2    lidocaine (LIDODERM) 5 % patch Apply 1 patch topically every 24 hours. Leave patch on for 12 hours, then remove for 12 hours. 30 patch 2    lidocaine (XYLOCAINE) 2 % solution Mix viscous lidocaine 2%, Mylanta, and Benadryl liquid into a 1:1:1 solution.  Take 10mL by mouth every 4 hours as needed for pain.   Swish and spit or swallow for pain. If swallowed, wait 10 minutes prior to eating/drinking. May also apply medication to a Q-tip and dab on areas of sores. 200 mL 2    loperamide (IMODIUM) 2 MG capsule For mild to moderate diarrhea (less than 5 BMs within 24 hours):  Take 1 capsule (2 mg) by mouth 4  times daily as needed for Diarrhea.  For severe Diarrhea (5 or more medium to large BMs within 24 hours):  take 2 capsules and then 1 capsule every 2 hours until diarrhea stops. 90 capsule 0    loratadine (CLARITIN REDITABS) 10 MG dissolvable tablet       LORazepam (ATIVAN) 0.5 MG tablet Take 1 tablet by mouth 30 to 60 minutes prior to MRI.  May repeat dose once if needed. 2 tablet 0    Multiple Vitamins-Minerals (MULTIVITAMIN WITH MINERALS) TABS tablet Take 1 tablet by mouth daily. 30 tablet 1    naloxone (NARCAN) 4 mg/0.1 mL nasal spray For suspected opioid overdose, call 911! Then spray once in one nostril. Repeat after 3 minutes if no or minimal response using a new spray in other nostril. 2 each 0    Nutritional Supplements (NEPRO) LIQD Take 3 Cans by mouth daily. 30000 mL 11    nystatin (MYCOSTATIN) 100,000 units/mL suspension 5 mL (500,000 Units) by Swish & Swallow route 4 times daily. 280 mL 0    polyethylene glycol (MIRALAX) 17 g packet Take 1 packet (17 g) by mouth 2 times daily as needed for Constipation. 1 each 0    pregabalin (LYRICA) 100 MG capsule Take 1 capsule (100 mg) by mouth 2 times daily.      prochlorperazine (COMPAZINE) 10 MG tablet Take 1 tablet (10 mg) by mouth every 6 hours as needed (Nausea/Vomiting). 30 tablet 5    senna (SENOKOT) 8.6 MG tablet Take 2 tablets (17.2 mg) by mouth 2 times daily. 120 tablet 2    tamsulosin (FLOMAX) 0.4 MG capsule TAKE 1 CAPSULE BY MOUTH EVERY DAY 90 capsule 1    tizanidine (ZANAFLEX) 2 MG tablet Take 1 tablet (2 mg) by mouth every 8 hours as needed (muscle spasm). 30 tablet 2    traZODone (DESYREL) 50 MG tablet Take one tab nightly by mouth at bedtime as needed  for insomnia 90 tablet 0     No current facility-administered medications on file prior to visit.       PSYCHIATRIC SPECIALTY EXAMINATION     Vital Signs:   There were no vitals taken for this visit.    Psychometric Scales:      04/06/2023     5:00 PM 04/27/2023     8:14 PM 05/11/2023    11:00 AM 06/01/2023    11:30 AM 06/05/2023     8:37 AM 06/29/2023     9:53 AM 07/20/2023     1:37 PM   Stephen [name] PHQ9 DEPRESSION QUESTIONNAIRE   Interest 0 0 0 2 0 0 0   Depressed 0 0 0 1 0 1 0          06/05/2023     8:36 AM   GAD 7   1. Feeling nervous, anxious or on edge 1   2. Not being able to stop or control worrying 1   3. Worrying too much about different things 1   4. Trouble relaxing 1   5. Being so restless that it is hard to sit still 0   6. Being easily annoyed or irritable 0   7. Feeling afraid as if something awful might happen 0   GAD7 Patient Total 4   If you checked off any problems, how difficult have these problems made it for you to do your job along with other people? Not difficult at all           No data to display  Pertinent Studies/Labs:    Latest Reference Range & Units 07/06/23 08:26   Sodium 136 - 145 mmol/L 141   Potassium 3.5 - 5.1 mmol/L 4.7   Chloride 98 - 107 mmol/L 102   Bicarbonate 22 - 29 mmol/L 32 (H)   Anion Gap 7 - 15 mmol/L 7   BUN 8 - 23 mg/dL 28 (H)   Creatinine 6.21 - 1.17 mg/dL 3.08   eGFR Based on CKD-EPI 2021 Equation mL/min/1.73 m2 >60   Glucose 70 - 99 mg/dL 91   Calcium 8.5 - 65.7 mg/dL 84.6   Alkaline Phos 40 - 129 U/L 109   ALT (SGPT) 0 - 41 U/L 11   AST (SGOT) 0 - 40 U/L 18   Bilirubin, Tot <1.2 mg/dL 9.62   Albumin 3.5 - 5.2 g/dL 4.3   Total Protein 6.0 - 8.0 g/dL 7.6   (H): Data is abnormally high    Mental Status Examination:   Appearance:  Appropriately groomed for the season, ambulating with a walker, cooperative, frustrated with no evidence of psychomotor agitation.    Behavior:  Help seeking, in pain, able to establish a rapport  Motor/Abnormal Involuntary Movements:  None  noted  Gait: not assessed  Speech: understandable/clear, coherent/meaningful, normal rate, and normal volume  Mood: "Still in pain"   Affect:  Mood congruent, broad range of affect   Thought Process: Coherent, logical  Associations: linear and goal-directed  Thought Content:  At this time, the patient does not endorse any core neurovegetative symptoms of depression, including suicidality.  It was not endorse any psychomotor agitation.  He is in agreement with titration of the BuSpar as tolerated to target his residual anxiety.  Perceptions: no abnl  Insight/Judgment:  Good/good   Orientation: AAO x4   Memory: recent and remote memory grossly intact alert and oriented to person time place and situation  Attention/Concentration: no gross abnormalities, adequate  Language: Average based on verbal fluency and interaction   Fund of knowledge and Intellect: Average based on interview and verbal fluency    MEDICAL DECISION MAKING     Assessment:  Stephen [name] is a 72 year old male with OA, HTN, MDD with L UTUC with metastatic disease ([id number]), with osseous mets (Pet/CT with diffuse enhancement and discrete lesions) who presents to clinic for a psychiatric intake appointment, reporting he can not pain burden and related anxiety in the face of a guarded prognosis and progressive disease.      Patient Strengths: Seeking treatment voluntarily, Supportive/involved family/friends, Well connected to community/professional resources, History of independent functioning, Accepts need for medication/therapeutic interventions, Intact cognition, Has insight into illness, Stable vocational history, History of treatment adherence, Stable living arrangements, and Average intelligence      A comprehensive suicide risk assessment was performed and the patient was assessed to be at a low acute risk of self-harm.  Modifiable risk factors include precipitating stressors and severe medical illness.  Non-modifiable risk factors include  existing psychiatric diagnoses, male gender, and older age.  The patient also has protective factors of future life plans, coping skills, and access to health care.        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 Adjustment disorder, with anxious mood  3. Urothelial cancer        Plan  At this time, the patient was educated about his premorbid history, his presenting predicament, and his course in his prescribed medication regime. At this time, he  is instructed to     Continue Cymbalta 60 mg p.o. b.i.d..    discontinue BuSpar 15 mg p.o. b.i.d.   start BuSpar 20 mg p.o. b.i.d.    Continue Trazodone 50mg  nightly prn insomnia          Labs/Tests/Consultation:  None  Referrals:  None       Additional Planning:  - Provided psychoeducation, supportive therapy and empathic listening today.  - Discussed suicide and emergency room precautions.  - Discussed risks/benefits/alternatives to medication, patient voiced understanding.  - CURES review on 10/18/23 reveals no suspicious activity.      Disposition: Return visit six weeks    Horton Finer, NP  Psychiatric Nurse Practitioner  [facility]

## 2023-10-19 ENCOUNTER — Other Ambulatory Visit (HOSPITAL_BASED_OUTPATIENT_CLINIC_OR_DEPARTMENT_OTHER): Payer: Self-pay | Admitting: Psychiatry

## 2023-10-19 DIAGNOSIS — Z7409 Other reduced mobility: Secondary | ICD-10-CM

## 2023-10-20 ENCOUNTER — Encounter (HOSPITAL_BASED_OUTPATIENT_CLINIC_OR_DEPARTMENT_OTHER): Payer: Self-pay | Admitting: Hematology & Oncology

## 2023-10-20 NOTE — Telephone Encounter (Signed)
Good Morning    A refill was requested. The medication requested has been pended below for your review.       Next visit in this department: 11/15/2023       Sallee Provencal  Medical Assistant    [facility]  [facility]  [address]  [place], [place] [address]  T: [phone] F: [phone]  website: [email or web address]

## 2023-10-21 ENCOUNTER — Encounter (HOSPITAL_BASED_OUTPATIENT_CLINIC_OR_DEPARTMENT_OTHER): Payer: Self-pay | Admitting: Family Medicine

## 2023-10-21 DIAGNOSIS — F419 Anxiety disorder, unspecified: Secondary | ICD-10-CM

## 2023-10-21 DIAGNOSIS — G893 Neoplasm related pain (acute) (chronic): Secondary | ICD-10-CM

## 2023-10-21 DIAGNOSIS — C7951 Secondary malignant neoplasm of bone: Secondary | ICD-10-CM

## 2023-10-21 DIAGNOSIS — K1230 Oral mucositis (ulcerative), unspecified: Secondary | ICD-10-CM

## 2023-10-21 DIAGNOSIS — C7910 Secondary malignant neoplasm of unspecified urinary organs: Secondary | ICD-10-CM

## 2023-10-23 ENCOUNTER — Ambulatory Visit: Payer: Medicare Other | Attending: Internal Medicine | Admitting: Hematology & Oncology

## 2023-10-23 ENCOUNTER — Encounter (HOSPITAL_BASED_OUTPATIENT_CLINIC_OR_DEPARTMENT_OTHER): Payer: Self-pay | Admitting: Orthopaedic Surgery of the Spine

## 2023-10-23 ENCOUNTER — Encounter (HOSPITAL_BASED_OUTPATIENT_CLINIC_OR_DEPARTMENT_OTHER): Payer: Self-pay

## 2023-10-23 ENCOUNTER — Encounter (HOSPITAL_BASED_OUTPATIENT_CLINIC_OR_DEPARTMENT_OTHER): Payer: Self-pay | Admitting: Nurse Practitioner

## 2023-10-23 ENCOUNTER — Other Ambulatory Visit: Payer: Self-pay

## 2023-10-23 DIAGNOSIS — C7801 Secondary malignant neoplasm of right lung: Secondary | ICD-10-CM

## 2023-10-23 DIAGNOSIS — G893 Neoplasm related pain (acute) (chronic): Secondary | ICD-10-CM

## 2023-10-23 DIAGNOSIS — C7802 Secondary malignant neoplasm of left lung: Secondary | ICD-10-CM

## 2023-10-23 DIAGNOSIS — K5903 Drug induced constipation: Secondary | ICD-10-CM

## 2023-10-23 DIAGNOSIS — N139 Obstructive and reflux uropathy, unspecified: Secondary | ICD-10-CM

## 2023-10-23 DIAGNOSIS — C688 Malignant neoplasm of overlapping sites of urinary organs: Secondary | ICD-10-CM

## 2023-10-23 DIAGNOSIS — C7910 Secondary malignant neoplasm of unspecified urinary organs: Secondary | ICD-10-CM

## 2023-10-23 DIAGNOSIS — C7951 Secondary malignant neoplasm of bone: Secondary | ICD-10-CM

## 2023-10-23 MED ORDER — HYDROMORPHONE HCL 2 MG OR TABS
ORAL_TABLET | ORAL | 0 refills | Status: DC
Start: 2023-10-23 — End: 2023-11-06
  Filled 2023-10-23: qty 60, 5d supply, fill #0

## 2023-10-23 MED ORDER — CALCIUM ACETATE (PHOS BINDER) 667 MG PO CAPS
2.0000 | ORAL_CAPSULE | Freq: Three times a day (TID) | ORAL | 2 refills | Status: DC
Start: 2023-10-23 — End: 2023-10-23

## 2023-10-23 MED ORDER — PREGABALIN 100 MG OR CAPS
100.0000 mg | ORAL_CAPSULE | Freq: Two times a day (BID) | ORAL | 0 refills | Status: DC
Start: 2023-10-23 — End: 2023-12-06
  Filled 2023-10-23: qty 60, 30d supply, fill #0

## 2023-10-23 MED ORDER — NYSTATIN 100000 UNIT/ML MT SUSP
5.0000 mL | Freq: Four times a day (QID) | OROMUCOSAL | 0 refills | Status: DC
Start: 2023-10-23 — End: 2023-11-06
  Filled 2023-10-23: qty 280, 14d supply, fill #0

## 2023-10-23 MED ORDER — CALCIUM ACETATE (PHOS BINDER) 667 MG PO CAPS
2.0000 | ORAL_CAPSULE | Freq: Three times a day (TID) | ORAL | 2 refills | Status: DC
Start: 2023-10-23 — End: 2023-11-29
  Filled 2023-10-23: qty 180, 30d supply, fill #0

## 2023-10-23 NOTE — Interdisciplinary (Signed)
[facility]-- AVS sent to pt via mychart.  RN called and spoke with pt over the phone and reviewed AVS.  Pt VU and thanked RN.

## 2023-10-23 NOTE — Telephone Encounter (Signed)
[name] Palliative Care Note:     Patient is requesting refill(s)   1) Dilaudid 2 mg tablet  Taking: 1 to 2 tablets (2mg  to 4mg ) by mouth every 4 hours as needed for moderate to severe pain    Last dispensed on: 10/09/23 for #60 tablets     2) Nystatin suspension   Taking: 5 mL (500,000 Units) by Swish & Swallow route 4 times daily.   Last dispensed:  10/09/23 for 280 mL    3) Pregabalin (lyrica) tablet   Taking: 1 capsule (100 mg) by mouth 2 times daily.   Last dispensed:  10/09/23   Dispensed quantity is not seen.      Last palliative care appt: 10/09/23 with Dr. Margarita Mail   Next palliative care appt: 11/06/23  with Dr. Margarita Mail     Preferred Pharmacy:   [facility]     Plan   Route to providers

## 2023-10-23 NOTE — Progress Notes (Signed)
Name: Stephen [name]   Date of Birth: 1950-12-26  Medical Record Number: [id number]    Genitourinary Medical Oncology Clinic     Patient ID: Metastatic urothelial carcinoma    Stage: Cancer Staging   Ureter malignant neoplasm, left (CMS-HCC)  Staging form: Renal Pelvis And Ureter, AJCC 8th Edition  - Clinical: Stage IV (cTX, cN0, pM1) - Signed by Cheri Guppy, MD on 12/01/2022    Oncologic History:    04/22/21  CTU: filling defect in L renal pelvis  05/05/21  Cysto with L ureteroscopy revealed a L papillary tumor, biopsied: mixed high (30%) and low grade urothelial carcinoma  06/16/21  Repeat cysto, papillary tumor: pathology low grade papillary [facility]  09/xx/22  Completed induction with mitogel (6 cycles)  08/19/21  HGTa of the bladder, no muscle  08/26/21  Repeat TURBT: NED, muscle identified  10/26/21  MRU: mild thickening in the L renal pelvis  12/24/21  Cysto/L ureteroscopy: dysplasia in the bladder; no disease in L ureter  05/06/22  L PCN placed  06/01/22  TURBT and L ureteroscopy: NED, stricture in place, stent placed  09/29/22  TURBT and L ureteroscopy: NED  10/22/22  MRU: Mild diffuse thickening in the L ureter/renal pelvis. Multiple osseous lesions involving the bilateral iliac crest and right acetabulum concerning for osseous metastatic disease.   11/26/22  PET/CT: diffuse osseous FDG activity with discrete osseous lesions concerning for metastatic disease.  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  Also treated for septic shock, AKI (Cr 2.5) and acute liver injury (AST/ALT 2800/2100).   S/p exchange L PCN.   02/07/23  start XRT to R Shoulder  02/10/23  C2D1 EV (dose reduced to 1.0 mg/kg) + pembro 200 mg  IV  03/03/23  C3D1 EV/pembro, missed D8  03/06/23  Cysto: NED.  Cytology NED.   03/14/23  Seen at [facility] ED for dislodged tube, replaced  04/02-05/24  Admitted with weakness, poor PO intake.   04/06/23  C4D1 EV/pembro  04/13/23  C4D8 EV  04/26/23  PET/CT: osseous lesions much improved, many new sclerotic lesions c/w treatment change.  Nonspecific mediastinal and pelvic LN  05/09-11/24  Admitted for treatment of AKI and rhabdomyolysis  05/11/23  C5D1 EV/P DR to 0.75  06/01/23  C6D1 EV/Pembro 0.75  06/23/23  MRI C-Spine: disease in spine, but no fracture or epidural involvement.  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  Lower back is his biggest problem - having some trouble walking - R leg has been giving out on him for months.  Other than that, he's been feeling pretty good.   Taking dilaudid1-2 times per day.  Had a fall yesterday.   Occ has bouts of constipation.   Some dry mouth, no mouth sores.    Review of Systems:  A complete ROS was performed and is negative except as documented above.     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  Non smoker  No etoh    Medications:  Current Outpatient Medications on File Prior to Visit   Medication Sig Dispense Refill    acetaminophen (TYLENOL) 500 MG tablet Take 2 tablets (1,000 mg) by mouth every 8 hours as needed for Mild Pain (Pain Score 1-3) or Moderate Pain (Pain Score 4-6).  40 tablet 0    albuterol (PROAIR HFA) 108 (90 Base) MCG/ACT inhaler ProAir HFA 90 mcg/actuation aerosol inhaler      allopurinol (ZYLOPRIM) 100 MG tablet Take 1 tablet (100 mg) by mouth daily. 30 tablet 0    aluminum-magnesium-simethicone (MAG-AL PLUS) 200-200-20 MG/5ML suspension Mix viscous lidocaine 2%, Mylanta, and Benadryl liquid into a 1:1:1 solution.  Take 10mL by mouth every 4 hours as needed for pain.  Swish and spit or swallow for pain. If swallowed, wait 10 minutes prior to eating/drinking. May also apply medication to a Q-tip and dab on areas of sores. 355 mL 2    amoxicillin (AMOXIL) 500 MG capsule Take 1 capsule (500 mg) by mouth 3 times daily. 21 capsule 0    Artificial Tear Solution (SOOTHE XP OP)       busPIRone (BUSPAR) 10 MG tablet Take 2 tablets (20 mg) by mouth 2 times daily. 60 tablet 2    diclofenac (VOLTAREN) 1 % gel Apply 2 g topically every 6 hours as needed (Right knee pain). 100 g 0    diphenhydrAMINE (BENADRYL) 12.5 MG/5ML liquid Mix viscous lidocaine 2%, Mylanta, and Benadryl liquid into a 1:1:1 solution.  Take 10mL by mouth every 4 hours as needed for pain.  Swish and spit or swallow for pain. If swallowed, wait 10 minutes prior to eating/drinking. May also apply medication to a Q-tip and dab on areas of sores. 236 mL 2    DULoxetine (CYMBALTA) 20 MG CR capsule Take 1 capsule (20 mg) by mouth 2 times daily. 180 capsule 2    Erdafitinib 4 MG TABS Take 8 mg by mouth daily. 60 tablet 3    fluticasone propionate (FLONASE) 50 MCG/ACT nasal spray 2 sprays.      HYDROmorphone (DILAUDID) 2 MG tablet Take 1 to 2 tablets (2mg  to 4mg ) by mouth every 4 hours as needed for moderate to severe pain 60 tablet 0    ibuprofen (MOTRIN) 600 MG tablet       ketotifen (ALAWAY) 0.025 % ophthalmic solution Place 1 drop into both eyes 2 times daily.      lactulose 10 GM/15ML solution Take 15mL to 30mL by mouth 3 times a day as needed for constipation (makes stools softer) 473 mL 2    lidocaine (LIDODERM) 5 %  patch Apply 1 patch topically every 24 hours. Leave patch on for 12 hours, then remove for 12 hours. 30 patch 2    lidocaine (XYLOCAINE) 2 % solution Mix viscous lidocaine 2%, Mylanta, and Benadryl liquid into a 1:1:1 solution.  Take 10mL by mouth every 4 hours as needed for pain.  Swish and spit or swallow for  pain. If swallowed, wait 10 minutes prior to eating/drinking. May also apply medication to a Q-tip and dab on areas of sores. 200 mL 2    loperamide (IMODIUM) 2 MG capsule For mild to moderate diarrhea (less than 5 BMs within 24 hours):  Take 1 capsule (2 mg) by mouth 4 times daily as needed for Diarrhea.  For severe Diarrhea (5 or more medium to large BMs within 24 hours):  take 2 capsules and then 1 capsule every 2 hours until diarrhea stops. 90 capsule 0    loratadine (CLARITIN REDITABS) 10 MG dissolvable tablet       LORazepam (ATIVAN) 0.5 MG tablet Take 1 tablet by mouth 30 to 60 minutes prior to MRI.  May repeat dose once if needed. 2 tablet 0    Multiple Vitamins-Minerals (MULTIVITAMIN WITH MINERALS) TABS tablet Take 1 tablet by mouth daily. 30 tablet 1    naloxone (NARCAN) 4 mg/0.1 mL nasal spray For suspected opioid overdose, call 911! Then spray once in one nostril. Repeat after 3 minutes if no or minimal response using a new spray in other nostril. 2 each 0    Nutritional Supplements (NEPRO) LIQD Take 3 Cans by mouth daily. 30000 mL 11    nystatin (MYCOSTATIN) 100,000 units/mL suspension 5 mL (500,000 Units) by Swish & Swallow route 4 times daily. 280 mL 0    polyethylene glycol (MIRALAX) 17 g packet Take 1 packet (17 g) by mouth 2 times daily as needed for Constipation. 1 each 0    pregabalin (LYRICA) 100 MG capsule Take 1 capsule (100 mg) by mouth 2 times daily.      prochlorperazine (COMPAZINE) 10 MG tablet Take 1 tablet (10 mg) by mouth every 6 hours as needed (Nausea/Vomiting). 30 tablet 5    senna (SENOKOT) 8.6 MG tablet Take 2 tablets (17.2 mg) by mouth 2 times daily. 120 tablet 2     tamsulosin (FLOMAX) 0.4 MG capsule TAKE 1 CAPSULE BY MOUTH EVERY DAY 90 capsule 1    tizanidine (ZANAFLEX) 2 MG tablet Take 1 tablet (2 mg) by mouth every 8 hours as needed (muscle spasm). 30 tablet 2    traZODone (DESYREL) 50 MG tablet Take one tab nightly by mouth at bedtime as needed for insomnia 90 tablet 0     No current facility-administered medications on file prior to visit.       Physical Examination:  There were no vitals filed for this visit.    ECOG PS 1  General: NAD  MCVV    Labs: Following labs reviewed  Lab Results   Component Value Date    WBC 5.5 10/18/2023    RBC 3.73 (L) 10/18/2023    HGB 11.0 (L) 10/18/2023    HCT 33.6 (L) 10/18/2023    MCV 90.1 10/18/2023    MCHC 32.7 10/18/2023    RDW 14.6 (H) 10/18/2023    PLT 154 10/18/2023    MPV 10.0 10/18/2023      Lab Results   Component Value Date    BUN 20 10/18/2023    CREAT 1.26 (H) 10/18/2023    CL 98 10/18/2023    NA 138 10/18/2023    K 4.4 10/18/2023    Provencal 9.9 10/18/2023    TBILI 1.43 (H) 10/18/2023    ALB 3.9 10/18/2023    TP 7.0 10/18/2023    AST 362 (H) 10/18/2023    ALK 187 (H) 10/18/2023    BICARB 29 10/18/2023    ALT 435 (H) 10/18/2023  GLU 88 10/18/2023                  Assessment:  Stephen [name] is a 72 year old male with OA, HTN, MDD with L UTUC with metastatic disease ([id number]), with osseous mets (Pet/CT with diffuse enhancement and discrete lesions). FGFR-TACC mutation.  His lines of therapy have included:  EV/P --> POD after 8 cycles  erdafitinib    I personally reviewed labs as above.  I personally reviewed scans as above.    Has been on erda about 5 weeks.  Labs about stable except for rising phos and bili that is rising.  Phos rising will add phoslo.  Continue erdafitinib  MCVV in 3 weeks with PET/CT prior to repeat staging.  Likely needs radiation to spinal mets    Advanced Urothelial Carcinoma, [id number]  - continue erdafitinib 8 mg daily  - add phoslo 1333 mg TID  - labs and visits Q2w  - likely to need radiation to spine  for spinal mets  - he is to get CT scans of his spines ordered by ortho  - consider adding zometa given significnt bone disease; needs dental clearance - he's known to have significant dental issues so this needs to be cleared first  - RTC 3 weeks with PET/Ct prior    Cancer related pain, constipation  - follows with palliative care, on multiple medications for pain including dilaudid  - caution given multiple events of AMS    R shoulder pain  - 2/2 metastatic lesion  - seen by ortho 1/22  - s/p XRT to R shoulder    Anxiety  - f/u with palliative care  - monitor closely    RLE edema  - improved    L PCN, initially placed 04/2022  - draining well, will monitor    This visit was spent addressing a complex, chronic illness that poses a threat to life or bodily function to this patient.  I independently reviewed the patient's history since prior visit, reviewed intervening labs and imaging, counseled the patient on the management of their chronic illness, and placed orders to be done prior to next visit.     Cauy has advanced urothelial carcinoma, currently on erdafitinib that has a risk of causing harm and needs monitoring of cbc, LFTs, electrolytes via labs tests every 2 weeks    Asa Saunas. Roseanne Reno, M.D.  Assistant Professor of Medicine   [employer] [place], [facility]  [facility]  [address]  [place], [place] [address]

## 2023-10-23 NOTE — Patient Instructions (Signed)
Patient Instructions:    -- Return to see Dr. Roseanne Reno on 12/5 at 1:30 pm    --Schedule PET scan for BEFORE 12/5:  [phone]    -- Complete labs EVERY TWO WEEKS:  CMP, CBC      --  Phoslo has been ordered to [facility]  calcium acetate (PHOSLO) 667 MG capsule [[id number]]    Order Details  Dose: 2 capsule Route: Oral Frequency: 3 TIMES DAILY WITH MEALS   Dispense Quantity: 180 capsule Refills: 2 Fills remaining: 2         Sig: Take 2 capsules (1,334 mg) by mouth 3 times daily (with meals).         Have questions about Advanced Care Planning/Advance Directive?  PREPARE ([email or web address])           Future Appointments   Date Time Provider Department Center   10/23/2023 12:30 PM Cheri Guppy, MD [facility]   10/31/2023  7:45 AM [facility] CT 2 [facility] CT [facility]   11/06/2023 10:00 AM Shea Evans, MD [facility] Onc [facility]   11/15/2023  9:00 AM Horton Finer, NP [facility] Gen Psy [facility]   12/01/2023  8:00 AM [place] Bali, MD [facility] [place] [facility]         Visit this website for Cancer resources at our [facility].  This includes information on [place] Chemo Support Groups, Mind/Body Wellness, Treatment Education Classes, and other [facility]:      [email or web address]       *Chief Operating Officer Website:  [email or web address]    Contact Information:   Brock Ra, MD   Assistant Professor of Medicine  Medical Oncology  Genitourinary Malignancy      Franco Nones, M.S., PA-C  Sr. Physician Assistant    Clinical questions or concerns  Oncology Nurse Case Manager   Elsworth Soho RN, BSN  T: [phone]  Department of Urology   [facility]  [address]  [place], [place] [address]    Dr. Roseanne Reno Follow Up Appointment Assistance  Administrative Assistant   [place]  T: [phone]  F: [phone]    Monday-Friday 8:00- 4:30p.m. (Closed Holidays and  Weekends)  Please listen to message carefully for daily information.   Leave a detailed message with your name, medical record number and   telephone number. Messages are usually returned within 24 hours.      [facility] Phone List     AFTER HOURS EMERGENCY NUMBER   Ask for the Oncologist Physician On-Call.                   [phone]    Outpatient Pavilion Scheduling line: [phone]  Call for information about our new location and to schedule or cancel appointments.    [facility] Scheduling line: [phone]   Call to schedule your [name], CT Scans or MRI studies.    PET Scan Scheduling line: [phone]  Call to schedule your PET scan    Prostate MRI Scheduling line: [phone] or [phone]  Call to schedule your prostate MRI.   Performed at the [facility] ([facility]).    Nuclear Medicine Scheduling Line [phone]   Call to schedule your bone scan, Radium 223     [facility] Infusion Center: [phone]   Call to schedule, cancel, or reschedule chemotherapy appointments.  Infusion Center Hours--- Monday-Friday 7:00-7:30 p.m.; Sat-Sun 8:00-4:30pm.    [facility] ([address]): [phone]  Monday-Friday 730-530 p.m.; Closed Weekends    Radiation Oncology: [phone]    Call to schedule, cancel, or reschedule radiation appointments    Social Worker  [place]: Mathews [place], [place]    T: [phone]  [place]: Henri Medal, LCSW  T: [phone]    Financial Counselors   [phone] at [facility]   [phone] or [phone]      [facility]  [address], [place] [address]    -You can also reach [name] by MyChart. To set up your MyChart please see the following site: [email or web address]  -For cancer support services, please visit out Patient and [facility].  -For [facility] support services, please  visit:  -[email or web address]  -For other support from the American Cancer Society, please visit: http://www.cancer.org/Treatment/SupportProgramsServices/index              Thank you for allowing [name] to participate in your care today! We value your feedback as we strive to provide every patient with an exceptional care experience.     You may receive a patient satisfaction survey in the mail in the next few weeks. Please fill out the survey upon receipt and return it in the self-addressed envelope. Your feedback will be used to help improve the experience for all our patients at [facility].     If you want to share a positive experience you had at our facility please call We Listen at [phone]. If you have any suggestions on how we can improve our services please call the [facility] leadership team at [phone].    For medical questions or emergencies, please call your physician's office or 911. Thank you!

## 2023-10-23 NOTE — Telephone Encounter (Signed)
[name] Service Progress Note    Received refill request for hydromorphone 2mg  to 4mg  PO q4h PRN pain, Nystatin 5mL PO QID, pregabalin 100mg  PO BID.  Patient's last appointment with our team was on 10/09/23 and patient's next appointment with our team is on 11/06/23.  Patient has Narcan PRN on file.  Reviewed CURES, per CURES, last picked up hydromorphone 2mg  #60 on 10/09/23, pregabalin 100mg  #30 on 10/02/23.  No aberrancies noted.  Will send refill as requested.    Stephen Alif Petrak, MD  Assistant Professor  [facility] Palliative Care Service  [email or web address]

## 2023-10-23 NOTE — Addendum Note (Signed)
Encounter addended by: Wannetta Sender on: 10/23/2023 12:29 PM   Actions taken: Flowsheet accepted, Charge Capture section accepted

## 2023-10-24 ENCOUNTER — Ambulatory Visit (HOSPITAL_BASED_OUTPATIENT_CLINIC_OR_DEPARTMENT_OTHER): Payer: Medicare Other

## 2023-10-24 ENCOUNTER — Other Ambulatory Visit: Payer: Self-pay

## 2023-10-24 MED ORDER — TRAZODONE HCL 50 MG OR TABS
ORAL_TABLET | ORAL | 0 refills | Status: AC
Start: 2023-10-24 — End: ?

## 2023-10-25 NOTE — Progress Notes (Addendum)
[facility], Radiation Oncology  Weekly Management Eval ?" OTV    Patient: Stephen [name]    DOB: [birth year]    MRN: [id number]    Date of Evaluation: 10/18/2023    Physician: Gillermo Murdoch,  Referring Physician: Brock Ra    Radiation:  Shoulder RT - Planned Dose 800cGy - Actual Dose 800cGy  2 Shoulder R (Plan ID - 1 Shoulder R) - Rx Dose 800cGy (Status - Treatment  Approved) - 1 / 1    Vitals: Performed on 10/18/2023 8:41 AM  Weight - 74.4 kg  Temperature - 97.0 F  Pulse - 65 / min  Respiration - 16 / min  O2 Sat - 98 %  Pain - 8  BP - 123/ 85 mm(hg)    Fatigue Score: 0    Performance: 2 - Ambulatory/capable of all self-care, unable to perform  any work activities. Up and about more than 50% of waking hours. (ECOG)    Nurse?Ts Note: Patient tolerated treatment well. Reports a 6/10 pain in   his  right shoulder today. States he has Dilaudid at home for pain management.  No other questions for the RN today.  CBernales RN    Chaperone Documentation:  Chaperone present: - No chaperone present: Not  required for the exam.    MD Note: Per nurse?Ts note.  No other new issues.    Physical Exam:  Gen: WD/WN A&O X3 in NAD  Treatment Area: Unchanged    Films:  Checked and approved for this week.    Assessment: Patient is tolerating treatment well.    Plan: I have reviewed the treatment plan and recommend continuation of  treatment as prescribed.    ATTENDING NOTE: I saw and evaluated the patient myself.  I agree with the  note above and the plan of care as documented in the resident's note.    Signature derived from controlled access password  Gillermo Murdoch, MD - PID [id number]  10/25/2023 12:30:02 PM

## 2023-10-26 ENCOUNTER — Ambulatory Visit
Admission: RE | Admit: 2023-10-26 | Discharge: 2023-10-26 | Disposition: A | Discharge status: Home / Self Care | Payer: Medicare Other | Source: Ambulatory Visit | Attending: Vascular & Interventional Radiology | Admitting: Vascular & Interventional Radiology

## 2023-10-26 ENCOUNTER — Other Ambulatory Visit: Payer: Self-pay

## 2023-10-26 ENCOUNTER — Telehealth (HOSPITAL_BASED_OUTPATIENT_CLINIC_OR_DEPARTMENT_OTHER): Payer: Self-pay

## 2023-10-26 ENCOUNTER — Ambulatory Visit (HOSPITAL_BASED_OUTPATIENT_CLINIC_OR_DEPARTMENT_OTHER): Payer: Medicare Other

## 2023-10-26 ENCOUNTER — Encounter (HOSPITAL_BASED_OUTPATIENT_CLINIC_OR_DEPARTMENT_OTHER)
Admission: RE | Disposition: A | Discharge status: Home Health | Payer: Self-pay | Source: Ambulatory Visit | Attending: Vascular & Interventional Radiology

## 2023-10-26 ENCOUNTER — Other Ambulatory Visit (HOSPITAL_BASED_OUTPATIENT_CLINIC_OR_DEPARTMENT_OTHER): Payer: Self-pay | Admitting: Physician Assistant

## 2023-10-26 DIAGNOSIS — N2889 Other specified disorders of kidney and ureter: Secondary | ICD-10-CM | POA: Insufficient documentation

## 2023-10-26 DIAGNOSIS — N179 Acute kidney failure, unspecified: Secondary | ICD-10-CM

## 2023-10-26 DIAGNOSIS — Z436 Encounter for attention to other artificial openings of urinary tract: Secondary | ICD-10-CM

## 2023-10-26 SURGERY — IR NEPHROSTOMY TUBE
Anesthesia: Local | Laterality: Left

## 2023-10-26 MED ORDER — IODIXANOL 320 MG/ML IV SOLN
INTRAVENOUS | Status: AC
Start: 2023-10-26 — End: 2023-10-26
  Filled 2023-10-26: qty 50

## 2023-10-26 MED ORDER — IODIXANOL 320 MG/ML IV SOLN
INTRAVENOUS | Status: DC | PRN
Start: 2023-10-26 — End: 2023-10-26
  Administered 2023-10-26: 10:00:00 10 mL

## 2023-10-26 MED ORDER — LIDOCAINE HCL 1 % IJ SOLN
INTRAMUSCULAR | Status: DC | PRN
Start: 2023-10-26 — End: 2023-10-26
  Administered 2023-10-26: 10:00:00 10 mL

## 2023-10-26 SURGICAL SUPPLY — 15 items
APPLICATOR CHLORAPREP 26ML, ~~LOC~~ (Prep Solutions)
BAG DRAINAGE NEPHROSTOMY 600ML (Misc Medical Supply) ×1
CATHETER DRAIN MULTIPURPOSE MAC-LOC 10.2FR X 25CM (Lines/Drains) ×1 IMPLANT
COVER PROBE ISOSILK 6" X 96" INTRA OPERATIVE (Drapes/towels) IMPLANT
DECANTER BAG-A-JET STERILE (Misc Medical Supply) ×1 IMPLANT
DRESSING SPONGE 2X2X4 PLY (Dressings/packing) ×1 IMPLANT
DRESSING SPONGE IV 2X2 STRL (Dressings/packing) ×1
DRESSING TEGADERM HP 4X4 (Dressings/packing) ×1 IMPLANT
GLOVE BIOGEL PI ULTRATOUCH SIZE 5.5 (Gloves/Gowns) ×1 IMPLANT
GLOVE BIOGEL PI ULTRATOUCH SIZE 8 (Gloves/Gowns) ×1 IMPLANT
GOWN SURGICAL ULTRA LG BLUE, AAMI LVL 3 (Gloves/Gowns) ×1 IMPLANT
GUIDEWIRE AMPLATZ SUPER STIFF 0.035" X 75CM, 7CM TAPER, STRAIGHT (Procedural wires/sheaths/catheters/balloons/dilators) ×1
PREP CHLOROPREP 10.5ML 1-STEP (Prep Solutions)
PROCEDURE PACK - IR NON-VASCULAR (Procedure Packs/kits) ×1
SUTURE ETHILON 2-0 18" FS (Suture) ×1 IMPLANT

## 2023-10-26 NOTE — Addendum Note (Signed)
Encounter addended by: Wannetta Sender on: 10/26/2023 9:14 AM   Actions taken: Flowsheet accepted, Charge Capture section accepted

## 2023-10-26 NOTE — Telephone Encounter (Signed)
Called patient to confirm the following appointment.    Procedure: CHNG NEPHROSTOMY TUBE   Date: 1/3  Check in Time: 9am  Procedure Start time: 10am  Location: [facility]  Prep: N/A  Driver: N/A

## 2023-10-26 NOTE — Plan of Care (Signed)
Problem: Promotion of Perioperative Health and Safety  Goal: Promotion of Health and Safety of the Perioperative Patient  Description: The patient remains safe, receives treatment appropriate to the surgical intervention and patient's physiological needs and is discharged or transferred to the appropriate level of care.    Information below is the current care plan.  Outcome: Progressing  Flowsheets (Taken 10/26/2023 0948)  Patient /Family stated Goal: change tubing and bag  Guidelines: PACU  Individualized Interventions/Recommendations #1: maintain safety and comfort of patient

## 2023-10-27 ENCOUNTER — Encounter (HOSPITAL_BASED_OUTPATIENT_CLINIC_OR_DEPARTMENT_OTHER): Payer: Self-pay | Admitting: Orthopaedic Surgery of the Spine

## 2023-10-31 ENCOUNTER — Encounter (HOSPITAL_BASED_OUTPATIENT_CLINIC_OR_DEPARTMENT_OTHER): Payer: Self-pay | Admitting: Hematology & Oncology

## 2023-10-31 ENCOUNTER — Ambulatory Visit: Payer: Medicare Other

## 2023-10-31 ENCOUNTER — Telehealth (HOSPITAL_BASED_OUTPATIENT_CLINIC_OR_DEPARTMENT_OTHER): Payer: Self-pay | Admitting: Hematology & Oncology

## 2023-10-31 NOTE — Telephone Encounter (Signed)
PT is calling to let Dr. Roseanne Reno know he is at [facility]. He's been there since Sunday night and doesn't know when he'll get out.    He states he's having trouble getting into his MyChart and attempt to reset his password. He states he can't get his cancer medicine or wont know if his sister can bring them to him.    Pt requests a callback from Dr. Marca Ancona nurse. Please review and return call back to pt at [phone] to further assist.

## 2023-10-31 NOTE — Telephone Encounter (Signed)
PLAN/DISCUSSION: Reviewed clinical history as well as exam results and imaging findings with patient in detail.      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     CT C-spine to evaluate potential bony impingement of the thecal sac   CT L-spine to evaluate arthrodesis as a source of pain  PT referral placed  [place] x-rays      FOLLOWUP: Return to clinic: three months. The patient is encouraged to call us with any questions or problems in the interim. Our contact numbers were given to the patient.            Electronically signed by Graylin Shiver, MD at 09/30/2023 11:09 AM

## 2023-10-31 NOTE — Telephone Encounter (Signed)
Pt was unable to  CT Scans were r/s to 11/26 at 12:15 pm.    RN called PET scheduling and LVM requesting that they call pt to schedule PET Scan before 12/5, if possible.  Pt's callback number was provided.    RN called pt to check in and relay the above appts.  No answer so RN LVM with new CT Scan appt date/time/location and her callback number should pt have additional questions.

## 2023-11-01 ENCOUNTER — Other Ambulatory Visit: Payer: Self-pay

## 2023-11-01 ENCOUNTER — Telehealth (HOSPITAL_BASED_OUTPATIENT_CLINIC_OR_DEPARTMENT_OTHER): Payer: Self-pay

## 2023-11-01 NOTE — Telephone Encounter (Signed)
[facility] OUTPATIENT PAVILION / Medical Oncology Social Work Telephone note:    Stephen [name] sent me the following message today:    "Hope you're doing well been in the hospital since Sunday at [facility] sepsis:-bad uti  talk later"    Dr. Marca Ancona team is aware.    Updated Dr. Logan Bores of [facility] service.    Will follow up with Stephen [name] to provide ongoing support once he's d/c'd from [facility].    Kingsley Plan, LCSW  [facility] Social Worker  [phone]

## 2023-11-04 ENCOUNTER — Other Ambulatory Visit (HOSPITAL_BASED_OUTPATIENT_CLINIC_OR_DEPARTMENT_OTHER): Payer: Self-pay | Admitting: Family Medicine

## 2023-11-04 DIAGNOSIS — G893 Neoplasm related pain (acute) (chronic): Secondary | ICD-10-CM

## 2023-11-04 DIAGNOSIS — C7951 Secondary malignant neoplasm of bone: Secondary | ICD-10-CM

## 2023-11-06 ENCOUNTER — Ambulatory Visit: Payer: Medicare Other | Attending: Family Medicine | Admitting: Family Medicine

## 2023-11-06 ENCOUNTER — Other Ambulatory Visit (HOSPITAL_BASED_OUTPATIENT_CLINIC_OR_DEPARTMENT_OTHER): Payer: Medicare Other

## 2023-11-06 ENCOUNTER — Other Ambulatory Visit: Payer: Self-pay

## 2023-11-06 ENCOUNTER — Ambulatory Visit (HOSPITAL_BASED_OUTPATIENT_CLINIC_OR_DEPARTMENT_OTHER)
Admit: 2023-11-06 | Discharge: 2023-11-06 | Disposition: A | Discharge status: Home Health | Payer: Medicare Other | Attending: Orthopaedic Surgery of the Spine | Admitting: Orthopaedic Surgery of the Spine

## 2023-11-06 VITALS — BP 122/82 | HR 61 | Temp 97.8°F | Resp 16 | Ht 66.14 in | Wt 157.8 lb

## 2023-11-06 DIAGNOSIS — Z4789 Encounter for other orthopedic aftercare: Secondary | ICD-10-CM

## 2023-11-06 DIAGNOSIS — R5383 Other fatigue: Secondary | ICD-10-CM | POA: Insufficient documentation

## 2023-11-06 DIAGNOSIS — Z936 Other artificial openings of urinary tract status: Secondary | ICD-10-CM | POA: Insufficient documentation

## 2023-11-06 DIAGNOSIS — M5412 Radiculopathy, cervical region: Secondary | ICD-10-CM | POA: Insufficient documentation

## 2023-11-06 DIAGNOSIS — N390 Urinary tract infection, site not specified: Secondary | ICD-10-CM | POA: Insufficient documentation

## 2023-11-06 DIAGNOSIS — Z981 Arthrodesis status: Secondary | ICD-10-CM | POA: Insufficient documentation

## 2023-11-06 DIAGNOSIS — G4700 Insomnia, unspecified: Secondary | ICD-10-CM | POA: Insufficient documentation

## 2023-11-06 DIAGNOSIS — R682 Dry mouth, unspecified: Secondary | ICD-10-CM | POA: Insufficient documentation

## 2023-11-06 DIAGNOSIS — R0981 Nasal congestion: Secondary | ICD-10-CM | POA: Insufficient documentation

## 2023-11-06 DIAGNOSIS — Z9181 History of falling: Secondary | ICD-10-CM | POA: Insufficient documentation

## 2023-11-06 DIAGNOSIS — R413 Other amnesia: Secondary | ICD-10-CM | POA: Insufficient documentation

## 2023-11-06 DIAGNOSIS — G893 Neoplasm related pain (acute) (chronic): Secondary | ICD-10-CM | POA: Insufficient documentation

## 2023-11-06 DIAGNOSIS — Z515 Encounter for palliative care: Secondary | ICD-10-CM | POA: Insufficient documentation

## 2023-11-06 DIAGNOSIS — C7951 Secondary malignant neoplasm of bone: Secondary | ICD-10-CM | POA: Insufficient documentation

## 2023-11-06 DIAGNOSIS — K5903 Drug induced constipation: Secondary | ICD-10-CM | POA: Insufficient documentation

## 2023-11-06 DIAGNOSIS — C7910 Secondary malignant neoplasm of unspecified urinary organs: Secondary | ICD-10-CM

## 2023-11-06 DIAGNOSIS — T402X5A Adverse effect of other opioids, initial encounter: Secondary | ICD-10-CM | POA: Insufficient documentation

## 2023-11-06 DIAGNOSIS — F419 Anxiety disorder, unspecified: Secondary | ICD-10-CM | POA: Insufficient documentation

## 2023-11-06 DIAGNOSIS — Z9189 Other specified personal risk factors, not elsewhere classified: Secondary | ICD-10-CM | POA: Insufficient documentation

## 2023-11-06 DIAGNOSIS — K1230 Oral mucositis (ulcerative), unspecified: Secondary | ICD-10-CM | POA: Insufficient documentation

## 2023-11-06 LAB — CBC WITH DIFF, BLOOD
ANC-Automated: 6.5 10*3/uL (ref 1.6–7.0)
Abs Basophils: 0 10*3/uL (ref <0.2)
Abs Eosinophils: 0.1 10*3/uL (ref 0.0–0.5)
Abs Lymphs: 1.6 10*3/uL (ref 0.8–3.1)
Abs Monos: 0.7 10*3/uL (ref 0.2–0.8)
Basophils: 0.3 %
Eosinophils: 1.4 %
Hct: 34.9 % — ABNORMAL LOW (ref 40.0–50.0)
Hgb: 11.1 g/dL — ABNORMAL LOW (ref 13.7–17.5)
Imm Gran %: 0.6 % (ref <1)
Imm Gran Abs: 0.1 10*3/uL — ABNORMAL HIGH (ref <0.1)
Lymphocytes: 17.6 %
MCH: 29.6 pg (ref 26.0–32.0)
MCHC: 31.8 g/dL — ABNORMAL LOW (ref 32.0–36.0)
MCV: 93.1 um3 (ref 79.0–95.0)
MPV: 10.3 fL (ref 9.4–12.4)
Monocytes: 7.3 %
Plt Count: 208 10*3/uL (ref 140–370)
RBC: 3.75 10*6/uL — ABNORMAL LOW (ref 4.60–6.10)
RDW: 14.6 % — ABNORMAL HIGH (ref 12.0–14.0)
Segs: 72.8 %
WBC: 8.9 10*3/uL (ref 4.0–10.0)

## 2023-11-06 LAB — COMPREHENSIVE METABOLIC PANEL, BLOOD
ALT (SGPT): 102 U/L — ABNORMAL HIGH (ref 0–41)
AST (SGOT): 114 U/L — ABNORMAL HIGH (ref 0–40)
Albumin: 4 g/dL (ref 3.5–5.2)
Alkaline Phos: 165 U/L — ABNORMAL HIGH (ref 40–129)
Anion Gap: 11 mmol/L (ref 7–15)
BUN: 22 mg/dL (ref 8–23)
Bicarbonate: 30 mmol/L — ABNORMAL HIGH (ref 22–29)
Bilirubin, Tot: 0.6 mg/dL (ref <1.2)
Calcium: 10.8 mg/dL — ABNORMAL HIGH (ref 8.5–10.6)
Chloride: 101 mmol/L (ref 98–107)
Creatinine: 1.08 mg/dL (ref 0.67–1.17)
Glucose: 125 mg/dL — ABNORMAL HIGH (ref 70–99)
Potassium: 4.4 mmol/L (ref 3.5–5.1)
Sodium: 142 mmol/L (ref 136–145)
Total Protein: 7.2 g/dL (ref 6.0–8.0)
eGFR Based on CKD-EPI 2021 Equation: >60 mL/min/{1.73_m2}

## 2023-11-06 LAB — PHOSPHORUS, BLOOD: Phosphorous: 4.7 mg/dL — ABNORMAL HIGH (ref 2.7–4.5)

## 2023-11-06 MED ORDER — HYDROMORPHONE HCL 2 MG OR TABS
ORAL_TABLET | ORAL | 0 refills | Status: DC
Start: 2023-11-06 — End: 2023-12-10
  Filled 2023-11-06: qty 90, 8d supply, fill #0

## 2023-11-06 MED ORDER — NYSTATIN 100000 UNIT/ML MT SUSP
5.0000 mL | Freq: Four times a day (QID) | OROMUCOSAL | 0 refills | Status: DC
Start: 2023-11-06 — End: 2023-11-13
  Filled 2023-11-06: qty 280, 14d supply, fill #0

## 2023-11-06 MED ORDER — DIPHENHYDRAMINE HCL 12.5 MG/5ML OR LIQD
ORAL | 2 refills | Status: DC
Start: 2023-11-06 — End: 2024-03-22
  Filled 2023-11-06: qty 236, 4d supply, fill #0

## 2023-11-06 MED ORDER — PROCHLORPERAZINE MALEATE 10 MG OR TABS
10.0000 mg | ORAL_TABLET | Freq: Four times a day (QID) | ORAL | 2 refills | Status: DC | PRN
Start: 2023-11-06 — End: 2024-03-22
  Filled 2023-11-06: qty 90, 23d supply, fill #0

## 2023-11-06 MED ORDER — FLUTICASONE PROPIONATE 50 MCG/ACT NA SUSP
2.0000 | Freq: Every day | NASAL | 2 refills | Status: DC
Start: 2023-11-06 — End: 2024-03-22
  Filled 2023-11-06: qty 16, 30d supply, fill #0

## 2023-11-06 MED ORDER — SENNA 8.6 MG OR TABS
17.2000 mg | ORAL_TABLET | Freq: Two times a day (BID) | ORAL | 2 refills | Status: DC
Start: 2023-11-06 — End: 2023-11-29
  Filled 2023-11-06: qty 120, 30d supply, fill #0

## 2023-11-06 MED ORDER — LIDOCAINE VISCOUS 2 % MT SOLN
OROMUCOSAL | 2 refills | Status: DC
Start: 2023-11-06 — End: 2024-03-22
  Filled 2023-11-06: qty 200, 3d supply, fill #0

## 2023-11-06 NOTE — Interdisciplinary (Signed)
Palliative Care RN Note    Reason for visit: follow up   Dx urothelial carcinoma    Advanced Care Planning:  on file   POLST not on file    Assessment:    Chief complaint: follow up constipation    - Patient Orientation: A&O x 4, answers questions readily and appropriately with recent and remote memories intact    Pain Assessment:  Location (s): lower back, shoulders and neck  symptoms  current pain  4/10  Worst pain  pain  7/10         Pain Medication routine  Tylenol 500 mg not taking   Cymbalta 200 mg daily  Hydromorphone 2 mg only took this one morning, states needs to take more frequently  Lyrica 100 mg hs    Bowel Movements:   - Consistency constipation  -not managed   Currently only taking 2-3 Senna daily  Not sure about lactulose, thinks not taking   - LBM: today, firm     Nausea:  - Denies N/V    Anxiety/depression/fatigue/sleep  - buspar 15 mg TID   Sleep interrupted to empty nephrostomy pouch  Trazodone for sleep   Anxious about CT results     WellBeing Assessment:   -Patient's needs currently being addressed.  Patient's affect/mood appropriate for situation.       Education: Chief Operating Officer and verbal instruction provided regarding plan of care.   Barriers to learning assessed: No barriers noted. Patient verbalized understanding of teaching and instructions.       Contact information, AVS given and reviewed related to plan of care. Questions answered to patient's satisfaction and verbalized understanding. Patient/family aware to call with any questions or concerns that may arise.    Caralee Ates RN

## 2023-11-06 NOTE — Patient Instructions (Addendum)
[facility] Palliative Care       PLAN OF CARE:    -Continue hydromorphone to 2mg  2 tablets (4mg ) by mouth every 4 hours as needed for pain  -Continue lidocaine 5% patches as needed for pain.  Wear for 12 hours and take off for 12 hours each day  -Continue duloxetine CR (Cymbalta) 20mg  by mouth 2 times a day  -Continue pregabalin (Lyrica) 100mg  by mouth 2 times a day  -Make sure you always take Senna 2 tablets by mouth 2 times a day  -Continue docusate 100mg  by mouth twice a day as needed for constipation  -Continue Miralax 1 tablespoon in water daily as needed for constipation  -Continue lactulose 15 milliliters to 30 milliliters by mouth 3 times daily as needed for constipation, make sure you take this  -Continue Buspar 20mg  by mouth 2 times a day  -Continue prochlorperazine 10mg  by mouth every 6 hours as needed for nausea  -I refilled magic mouthwash.  I also prescribed you an anti-fungal mouthwash (Nystatin), you can use this as needed if you think you have thrush (oral discharge)  -Continue follow up with Psychiatry     We will see you for follow up on 12/25/23 at 10 AM in person at [facility] (60 minutes)    List of Palliative Care Providers:  Marijean Heath, MD   Theda Belfast, NP  Gaspar Skeeters, NP  Margarita Mail, MD  Davy Pique, NP  Vivien Presto, NP  Novella Rob, MD  Berenice Primas, MD  Clint Guy MD  Era Skeen, DO FAAHPM  Awilda Metro, MD      Administrative Assistant/Team Coordinator   Jeannetta Ellis   Alcide Clever     Social Workers:   Laurena Spies, LCSW  [place] Panagiotou, LCSW    Spiritual Counselor/Chaplain  Lehman Prom, [facility]   Dan Europe, [place] CME     Nurse Case Managers  Sherald Barge, RN   Caralee Ates, RN  Reva Pedro Earls, RN   Amie Bronson Curb, LVN  Lucilla Lame, [place]  .............................................................................................................................................      HOW TO CONTACT PALLIATIVE CARE  NURSE  LINE--------[phone]--leave a voicemail and we will call you back  SCHEDULING------[phone]  FAX--------------------[phone]   [place]------Use MyChart Messaging for non-emergent issues  ?   HOURS: Monday through Friday 8:00 am - 5:00 pm?(closed holidays and weekends)      AFTER HOURS: For urgent symptom issues after hours, call [phone] and ask for the Doctor-on-Call for Palliative Care.   ?   Emergency: Call 911 or go to the nearest emergency room.?   .............................................................................................................................................   ?  [place] Help Desk: [phone]      ............................................................................................................................................      Special Instructions Regarding Medications:   ?   1. Prescription Refills       -4 days before running out of your medication, NOTIFY us                 Use [phone] or via MyChart message.                 We can Mail you palliative care prescriptions if you like, they are managed by the [facility] mail order pharmacy                Please let us know 4 business days in advance of running out    Mail order refills are ONLY delivered Monday-Friday (no holidays)     We will need to know the delivery address  Someone will need to be home to sign for the medication (will need to show drivers license and sign for delivery)    2. -Pain medication Agreement                Pain medications to treat cancer-relate pain should only be prescribed by [facility] palliative care unless you are in the ER               We must see you regularly to assess you and safely prescribe               If you miss an appointment or need to cancel/reschedule please call us right away at [phone]  ?   2. Storage                Medicines should be stored in the original bottles in a cool, dry place.                 Please keep them securely out of  reach of children and pets.   ?   3. Disposal: Please dispose of your medicines safely. Here is a link to safe disposal sites in [place]: http://trevino.com/.php    ?   4. Precaution: Please keep all medications out of reach of children.   ?   .............................................................................................................................................?   Financial Counselors  [phone] [facility]  [phone] or [phone]     Advance Care Planning   ?   Many patients ask about the differences between an Advance Directive and a POLST.       Here is a Lobbyist that clarifies the differences between these documents:    [email or web address] ?      Differences between an Advance Directive and a POLST are:    An Advance Directive is for:    1. Anyone 64 years of age or older    2. Provides instructions for future treatment    3. Appoints a health care representative    4. Guides inpatient treatment decisions when make available       A POLST is for:    1. Anyone with a serious illness - any age   2. Provides medical orders for current treatment    3. Guides actions by Emergency Medical Personnel when made available    4. Guides inpatient treatment decisions when made available   5. This is a bright pink form that should be placed on your refrigerator    .............................................................................................................................................      Patient Experience Department   ?   The Patient Experience Department acts as a bridge between our patients, hospitals and physicians to respond to concerns, issues and requests. Patient experience specialists are here to help patients and their families communicate their experiences with [facility] and navigate our health system. Please call [phone] or send an email  to [email or web address].    ..............................................Marland Kitchen     MyPath     We are excited to share our new [facility] Cancer Services application: MyPath. MyPath will guide you through your unique cancer journey and connect you with our expansive network of curated support services and resources. It is available in [place] and [place].     You can download the MyPath application in four easy steps:     1.       Use the QR code to open your app store.  2.       Click on the MyPath app icon and install  on your device.  3.       Open the MyPath app.  4.       Enter your [place] username and password to log in.

## 2023-11-06 NOTE — Progress Notes (Signed)
[facility] Service Outpatient Progress Note    Requested by Dr. Brock Ra    CC: Pain, psychosocial support    ID: Stephen [name] is a 72 year old male with a history of metastatic urothelial carcinoma, OA, HTN, MDD, gout referred to Palliative Care clinic for symptom management and psychosocial support.    Interval Events:  -Stephen [name] was admitted to [facility] from 10/29/23-11/02/23 for UTI, was treated with IV antibiotics while admitted but urine culture did not show any specific bacteria, was not discharged with PO antibiotics per Infectious Disease  -Stephen [name] had a follow up with Oncology (Dr. Roseanne Reno) on 10/23/23, per review of note planned to continue erdafitinib    Subjective:    Today we met with patient in palliative care clinic.  We addressed several issues during his visit:     #) Pain: See description below.   Location: Low back, shoulder, neck  Quality: Sharp, aching  Severity:   Current pain score: 4  Worst pain score: 7  Duration/Timing: constant, fluctuating  Modifying factors       Aggravating factors: sitting down, activities, movement       Alleviating factors: tizanidine, hydromorphone, pregabalin    -Not taking Ibuprofen, wants to remove this from medication list which we did today  -Has acetaminophen 500mg  PO q8h PRN pain but is not taking it  -Taking hydromorphone 2mg  1-2 tablets (2mg  to 4mg ) PO q4h PRN, does not take every day, sometimes only takes 1-2 tablets per day.  Needs refill today.    -Taking tizanidine 2mg  PO q8h PRN muscle spasms, not taking this very often  -Taking pregabalin 100mg  PO BID  -Takes duloxetine CR 20mg  PO BID  -Also has followed with Pain Management and Orthopedics  -Denies any adverse effects of the hydromorphone, pregabalin, duloxetine or tizanidine such as sedation, lethargy, myoclonus or pruritus.  Not currently having falls but in the past has reported issues with frequent falls, which we need to monitor closely.  Also has had some difficulties with mild memory issues,  making long acting opioids likely an unsafe choice (patient reports his sister is able to help with medications to some extent but is busy working and taking care of others during the day frequently)    #) Bowel movements:  -Having constipation, takes Senna 2-4 tablets PO total per day  -Not taking lactulose or Miralax.  Discussed that he should try taking these, he is amenable to this, will have team call patient later this week to check on him  -Discussed importance of consistently taking his laxatives and stool softeners to prevent opioid induced constipation and SBO  -Last BM today (firm)    #) Nausea:  -Has nausea at times but denies nausea/vomiting today  -Takes prochlorperazine 10mg  PO q6h PRN for nausea, still helpful when he needs it, does not desire any changes in this today but does want a refill of this    #) Nutrition and dysphagia:  -Drinking renal nutrition shakes as tolerated  -Mainly eats soft foods due to not having teeth or dentures  -Per review of Speech Therapy note, 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 Attributes this to his erdafitinib.  Worried he had oral thrush, taking Nystatin  to help treat/prevent this, wants refill of this today  -Magic mouthwash PRN has also been helpful, needs refill of a few ingredients of this today  -Also noticing significant dry mouth, does not want to discontinue any of the medications he is taking that may be causing dry mouth as he feels they are important for pain and symptom management  -Has tried Biotene PRN, lozenges PRN but not helpful    #) Insomnia:  -Taking trazodone 50mg  PO nightly PRN but not taking a lot of nights, wakes up frequently to change out nephrostomy  bag    #) Fatigue:  -Continues to feel quite fatigued most days, able to talk short walks with his walker with a seat, have suggested wheelchair in the past but patient adamantly declined that.   No changes in this today.  Still feeling fatigued but able to travel to/from appointments and family helping him with medications and other tasks at home  -Denies any new falls since last visit, but at previous visits was reporting frequently falls, will need to monitor closely    #) Nephrostomy tube and recent UTI symptoms:  -Urine appears clear today  -No fevers, chills, abdominal pain, flank pain reported today  -Recently finished course of IV antibiotics at [facility]  -Not taking tamsulosin, wants me to remove this from his medication list    #) Anxiety:  -Reports ongoing understandable anxiety about his cancer and cancer treatment, particularly about his upcoming PET/CT  -Taking Buspar 20mg  PO BID  -Takes trazodone 50mg  PO nightly PRN (not taking often right now)  -Stopped taking hydroxyzine, states it didn't help with anxiety in the past  -Will avoid stronger anxiolytics such as lorazepam given hx of memory problems, frequent falls and hx of possible opioid overdose unless assessed to be indicated per Psychiatry  -Seeing Psychiatry, last visit on 10/18/23    #) Urothelial carcinoma:  -Currently on treatment with erdafitinib, having oral pain and hair loss    #) Nasal congestion:  -Requesting refill of Flonase today    #) Psychosocial domain:  -Still living with his sister who he gets along well with  -Patient's niece currently lives with him although not closely involved in his life, and does not get along with her sometimes  -Adamantly declines wheelchair, has declined increased home support  -Sister does help him manage medications, Stephen [name] is okay with me calling her as needed to go over his medications  -Sister's mother-in-law died recently, father-in-law currently staying with him  -Inquires about hospice today,  understood hospice to be a place rather than a service (has been discussed with Stephen [name] by [employer] team in the past), but open to an informational phone call.  Feels wary of the hospice coming to his home to talk to him, would prefer to talk to them on the phone at first.  Does not feel ready for hospice at this point but wants to know his options.    #) Advance care planning:  -Health care agent: Colman Cater (sister)  -Has advance directive on file from 2019 which is notarized.      ROS: Symptom Assessment      11/06/2023    10:00 AM 07/27/2023     5:52 PM 07/20/2023     3:38 PM 07/06/2023    10:14 AM 06/29/2023     9:53 AM 06/26/2023     8:00 AM 06/08/2023    12:01 PM   Palliative Care Symptom Assessment   Pain Level 5  5    Constipation 7     5    Tiredness 3     3    Nausea 0     0    Depression 6     9    Anxiety 6     9    Drowsiness 3     6    Appetite 1     4    Wellbeing 5     8    Dyspnea 0     0    Weight Loss 4         Insomnia      3    ECOG  2 2 2 2  2        Additional ROS reviewed in detail and negative:  Review of Systems   Constitutional:  Positive for appetite change, fatigue and unexpected weight change. Negative for chills, diaphoresis and fever.   HENT:   Positive for mouth sores and trouble swallowing. Negative for sore throat and voice change.    Eyes:  Negative for icterus.   Respiratory:  Negative for chest tightness, cough, hemoptysis, shortness of breath and wheezing.    Cardiovascular:  Negative for chest pain, leg swelling and palpitations.   Gastrointestinal:  Positive for constipation. Negative for abdominal pain, blood in stool, diarrhea, nausea and vomiting.   Genitourinary:  Negative for dysuria, frequency and hematuria.    Musculoskeletal:  Positive for arthralgias, back pain, gait problem and neck pain.   Skin:  Negative for rash.   Neurological:  Positive for extremity weakness and gait problem. Negative for light-headedness and speech difficulty.   Psychiatric/Behavioral:  Positive for  sleep disturbance. Negative for suicidal ideas. The patient is nervous/anxious.         Outpatient Palliative Regimen:  Acetaminophen 1000mg  PO q8h PRN pain   Duloxetine CR 20mg  PO BID  Prochlorperazine 10mg  PO q6h PRN nausea  Trazodone 50mg  PO nightly (taking rarely as needed)  Pregabalin 100mg  PO BID  Miralax 17gm PO BID PRN constipation (not taking)  Senna-S 2 tablets PO BID (reports taking 2-4 tablets total per day)  Lidocaine 5% patch 12 hours on / 12 hours off  Diclofenac 1% topical QID PRN pain  Narcan PRN  Ibuprofen 600mg  PRN (not taking)  Tizanidine 2mg  PO q8h PRN muscle spasms (rarely taking this)  Loperamide PRN  Lactulose 15mL to 30mL PO TID PRN constipation (not taking)  Magic mouthwash PRN  Buspar 20mg  PO BID    Conversion to Oral Morphine Equivalent = 10 to 20    Allergies & Reactions: Review of patient's allergies indicates Sulfa drugs    Medications: (reviewed today)  Current Outpatient Medications on File Prior to Visit   Medication Sig Dispense Refill    acetaminophen (TYLENOL) 500 MG tablet Take 2 tablets (1,000 mg) by mouth every 8 hours as needed for Mild Pain (Pain Score 1-3) or Moderate Pain (Pain Score 4-6). 40 tablet 0    albuterol (PROAIR HFA) 108 (90 Base) MCG/ACT inhaler ProAir HFA 90 mcg/actuation aerosol inhaler      allopurinol (ZYLOPRIM) 100 MG tablet Take 1 tablet (100 mg) by mouth daily. 30 tablet 0    aluminum-magnesium-simethicone (MAG-AL PLUS) 200-200-20 MG/5ML suspension Mix viscous lidocaine 2%, Mylanta, and Benadryl liquid into a 1:1:1 solution.  Take 10mL by mouth every 4 hours as needed for pain.  Swish and spit or swallow for pain. If swallowed, wait 10 minutes prior to eating/drinking. May also apply medication to  a Q-tip and dab on areas of sores. 355 mL 2    [DISCONTINUED] amoxicillin (AMOXIL) 500 MG capsule Take 1 capsule (500 mg) by mouth 3 times daily. 21 capsule 0    Artificial Tear Solution (SOOTHE XP OP)       busPIRone (BUSPAR) 10 MG tablet Take 2 tablets (20 mg)  by mouth 2 times daily. 60 tablet 2    calcium acetate (PHOSLO) 667 MG capsule Take 2 capsules (1,334 mg) by mouth 3 times daily (with meals). 180 capsule 2    diclofenac (VOLTAREN) 1 % gel Apply 2 g topically every 6 hours as needed (Right knee pain). 100 g 0    [DISCONTINUED] diphenhydrAMINE (BENADRYL) 12.5 MG/5ML liquid Mix viscous lidocaine 2%, Mylanta, and Benadryl liquid into a 1:1:1 solution.  Take 10mL by mouth every 4 hours as needed for pain.  Swish and spit or swallow for pain. If swallowed, wait 10 minutes prior to eating/drinking. May also apply medication to a Q-tip and dab on areas of sores. 236 mL 2    DULoxetine (CYMBALTA) 20 MG CR capsule Take 1 capsule (20 mg) by mouth 2 times daily. 180 capsule 2    Erdafitinib 4 MG TABS Take 8 mg by mouth daily. 60 tablet 3    [DISCONTINUED] fluticasone propionate (FLONASE) 50 MCG/ACT nasal spray 2 sprays.      [DISCONTINUED] HYDROmorphone (DILAUDID) 2 MG tablet Take 1 to 2 tablets (2mg  to 4mg ) by mouth every 4 hours as needed for moderate to severe pain 60 tablet 0    [DISCONTINUED] ibuprofen (MOTRIN) 600 MG tablet       ketotifen (ALAWAY) 0.025 % ophthalmic solution Place 1 drop into both eyes 2 times daily.      lactulose 10 GM/15ML solution Take 15mL to 30mL by mouth 3 times a day as needed for constipation (makes stools softer) 473 mL 2    lidocaine (LIDODERM) 5 % patch Apply 1 patch topically every 24 hours. Leave patch on for 12 hours, then remove for 12 hours. 30 patch 2    [DISCONTINUED] lidocaine (XYLOCAINE) 2 % solution Mix viscous lidocaine 2%, Mylanta, and Benadryl liquid into a 1:1:1 solution.  Take 10mL by mouth every 4 hours as needed for pain.  Swish and spit or swallow for pain. If swallowed, wait 10 minutes prior to eating/drinking. May also apply medication to a Q-tip and dab on areas of sores. 200 mL 2    [DISCONTINUED] loperamide (IMODIUM) 2 MG capsule For mild to moderate diarrhea (less than 5 BMs within 24 hours):  Take 1 capsule (2 mg)  by mouth 4 times daily as needed for Diarrhea.  For severe Diarrhea (5 or more medium to large BMs within 24 hours):  take 2 capsules and then 1 capsule every 2 hours until diarrhea stops. 90 capsule 0    loratadine (CLARITIN REDITABS) 10 MG dissolvable tablet       LORazepam (ATIVAN) 0.5 MG tablet Take 1 tablet by mouth 30 to 60 minutes prior to MRI.  May repeat dose once if needed. 2 tablet 0    Multiple Vitamins-Minerals (MULTIVITAMIN WITH MINERALS) TABS tablet Take 1 tablet by mouth daily. 30 tablet 1    naloxone (NARCAN) 4 mg/0.1 mL nasal spray For suspected opioid overdose, call 911! Then spray once in one nostril. Repeat after 3 minutes if no or minimal response using a new spray in other nostril. 2 each 0    Nutritional Supplements (NEPRO) LIQD Take 3 Cans by mouth  daily. 30000 mL 11    [DISCONTINUED] nystatin (MYCOSTATIN) 100,000 units/mL suspension 5 mL (500,000 Units) by Swish & Swallow route 4 times daily. 280 mL 0    polyethylene glycol (MIRALAX) 17 g packet Take 1 packet (17 g) by mouth 2 times daily as needed for Constipation. 1 each 0    pregabalin (LYRICA) 100 MG capsule Take 1 capsule (100 mg) by mouth 2 times daily. 60 capsule 0    [DISCONTINUED] prochlorperazine (COMPAZINE) 10 MG tablet Take 1 tablet (10 mg) by mouth every 6 hours as needed (Nausea/Vomiting). 30 tablet 5    [DISCONTINUED] senna (SENOKOT) 8.6 MG tablet Take 2 tablets (17.2 mg) by mouth 2 times daily. 120 tablet 2    [DISCONTINUED] tamsulosin (FLOMAX) 0.4 MG capsule TAKE 1 CAPSULE BY MOUTH EVERY DAY 90 capsule 1    tizanidine (ZANAFLEX) 2 MG tablet Take 1 tablet (2 mg) by mouth every 8 hours as needed (muscle spasm). 30 tablet 2    traZODone (DESYREL) 50 MG tablet Take one tab nightly by mouth at bedtime as needed for insomnia 90 tablet 0     No current facility-administered medications on file prior to visit.       Past Medical History:  Past Medical History:   Diagnosis Date    Chronic back pain     Congenital hydronephrosis      Gout     Headache     Hematuria     HTN (hypertension) 12/10/2021    Kidney disease     Kidney stones     Major depressive disorder, single episode     Polyarthropathy or polyarthritis of multiple sites     Retinal detachment     Urethral stricture        Past Surgical History:  Past Surgical History:   Procedure Laterality Date    CT INSERTION OF SUPRAPUBIC CATH  09/25/2015    NEPHRECTOMY Right 1955    APPENDECTOMY      COLONOSCOPY      CYSTOSCOPY      CYSTOSCOPY W/ LASER LITHOTRIPSY      OTHER SURGICAL HISTORY      Interstim 01/29/2011    SPINE SURGERY  09/21    Lumbar-sacral fusion    TRANSURETHRAL RESECTION OF PROSTATE         Social History:  Healthcare agent/surrogate decision-maker: Colman Cater (sister)  Social History     Social History Narrative    Not on file        Family History:  Family History   Adopted: Yes   Family history unknown: Yes       PEX:  Vitals:    11/06/23 0950   BP: 122/82   BP Location: Left arm   BP Patient Position: Sitting   BP cuff size: Regular   Pulse: 61   Resp: 16   Temp: 97.8 F (36.6 C)   TempSrc: Temporal   SpO2: 97%   Weight: 71.6 kg (157 lb 12.8 oz)   Height: 5' 6.14" (1.68 m)       General: No acute distress, thin, sitting in chair  HEENT: PERRLA, EOMI, no conjunctivitis or scleral icterus noted.     Neck:  Trachea midline.  Lungs: Clear to auscultation bilaterally, no wheezes, no rhonchi, no rales  CV: Regular rate and rhythm, no murmurs, no rubs, no gallops  Abdomen: Soft, nondistended, nontender to palpation, no rebound or guarding, normoactive bowel sounds in all four quadrants, no CVAT, +nephrostomy tube noted with  clear urine in bag on L side  Neuro: Oriented x3, no dysarthria, moving all four extremities spontaneously, 5/5 strength throughout.  Still having some noticeable memory issues at times (does not recall hospice discussion with SW team in the past)  Psych: Thought process linear and goal-directed, thought content appropriate, affect full range and  appropriate, denies suicidal ideation, reports understandable anxiety about his cancer  Skin: Warm and well perfused.  Brisk capillary refill.      Pertinent Labs:     Lab Results   Component Value Date    WBC 8.9 11/06/2023    RBC 3.75 (L) 11/06/2023    HGB 11.1 (L) 11/06/2023    HCT 34.9 (L) 11/06/2023    MCV 93.1 11/06/2023    MCHC 31.8 (L) 11/06/2023    RDW 14.6 (H) 11/06/2023    PLT 208 11/06/2023    MPV 10.3 11/06/2023       Lab Results   Component Value Date    BUN 22 11/06/2023    CREAT 1.08 11/06/2023    CL 101 11/06/2023    NA 142 11/06/2023    K 4.4 11/06/2023    [place] 10.8 (H) 11/06/2023    TBILI 0.60 11/06/2023    ALB 4.0 11/06/2023    TP 7.2 11/06/2023    AST 114 (H) 11/06/2023    ALK 165 (H) 11/06/2023    BICARB 30 (H) 11/06/2023    ALT 102 (H) 11/06/2023    GLU 125 (H) 11/06/2023       Above labs reviewed    Pertinent Diagnostic Study Findings:  Ultrasound abdominal limited  Result Date: 10/30/2023  IMPRESSION: Unremarkable ultrasound of the gallbladder and liver release     CT cervical spine without contrast  Result Date: 10/29/2023  IMPRESSION: 1. No acute findings in the cervical, thoracic spine. Stable diffuse sclerotic metastases when compared with the study from March 2024. No pathologic fractures. 2. No significant canal stenosis in the cervical, thoracic spine. 3. Unchanged severe right neural from narrowing T1-T2 and T2-T3. 4. Mild anterior mediastinal lymphadenopathy. release    CT thoracic spine without contrast  Result Date: 10/29/2023  IMPRESSION: 1. No acute findings in the cervical, thoracic spine. Stable diffuse sclerotic metastases when compared with the study from March 2024. No pathologic fractures. 2. No significant canal stenosis in the cervical, thoracic spine. 3. Unchanged severe right neural from narrowing T1-T2 and T2-T3. 4. Mild anterior mediastinal lymphadenopathy. release    CT abdomen and pelvis without IV contrast  Result Date: 10/29/2023  IMPRESSION: 1. No acute  inflammation abdomen or pelvis. 2. Constipation with stool-filled distention of the rectum. 3. Bowel containing left inguinal hernia. No evidence of obstruction 4. Evolving appearance of the innumerable osteoblastic bone lesions throughout the axial and appendicular skeleton, similar in distribution but increased in sclerotic component in some of the bone lesions. This may reflect interval treatment response. No pathologic fracture. 5. Percutaneous left nephrostomy tube in place. No significant hydronephrosis. release EXPOSURE : This patient received a total of 2 exposure event(s) during this CT examination. The CTDIvol and DLP radiation dose values for each exposure are:       Above findings reviewed    ASSESSMENT: Torrez Whack is a 72 year old male with a history of metastatic urothelial carcinoma, OA, HTN, MDD, gout referred to Palliative Care clinic for symptom management and psychosocial support.    Medical chart reviewed in detail.  Above labs reviewed and notable for anemia, elevated creatinine (Cr 1.26),  elevated total bilirubin (1.43), elevated AST/ALT, elevated alkaline phosphatase   Above imaging reviewed personally and notable for unremarkable ultrasound of the gallbladder; no acute findings in spine; diffuse sclerotic metastases but no pathologic fractures in spine; constipation; left inguinal hernia      Diagnoses and all orders for this visit:    Metastatic urothelial carcinoma (CMS-HCC)    Pain from bone metastases (CMS-HCC)  -     HYDROmorphone (DILAUDID) 2 MG tablet; Take 1 to 2 tablets (2mg  to 4mg ) by mouth every 4 hours as needed for moderate to severe pain    Cervical radiculopathy    Cancer related pain  -     HYDROmorphone (DILAUDID) 2 MG tablet; Take 1 to 2 tablets (2mg  to 4mg ) by mouth every 4 hours as needed for moderate to severe pain    Dry mouth  -     diphenhydrAMINE (BENADRYL) 12.5 MG/5ML liquid; Mix viscous lidocaine 2%, Mylanta, and Benadryl liquid into a 1:1:1 solution.  Take  10mL by mouth every 4 hours as needed for pain.  Swish and spit or swallow for pain. If swallowed, wait 10 minutes prior to eating/drinking. May also apply medication to a Q-tip and dab on areas of sores.  -     lidocaine (XYLOCAINE) 2 % solution; Mix viscous lidocaine 2%, Mylanta, and Benadryl liquid into a 1:1:1 solution.  Take 10mL by mouth every 4 hours as needed for pain.  Swish and spit or swallow for pain. If swallowed, wait 10 minutes prior to eating/drinking. May also apply medication to a Q-tip and dab on areas of sores.    Mucositis  -     nystatin (MYCOSTATIN) 100,000 units/mL suspension; 5 mL (500,000 Units) by Swish & Swallow route 4 times daily.    Therapeutic opioid induced constipation  -     senna (SENOKOT) 8.6 MG tablet; Take 2 tablets (17.2 mg) by mouth 2 times daily.    Fatigue, unspecified type    At risk for nausea  -     prochlorperazine (COMPAZINE) 10 MG tablet; Take 1 tablet (10 mg) by mouth every 6 hours as needed (Nausea/Vomiting).    Insomnia, unspecified type    Anxiety    Memory problem    History of fall    Nasal congestion  -     fluticasone propionate (FLONASE) 50 MCG/ACT nasal spray; Spray 2 sprays into each nostril daily.    Nephrostomy status (CMS-HCC)    Recurrent UTI    Palliative care by specialist                      PLAN:  - continue hydromorphone to 2mg  2 tablets (4mg ) PO q4h PRN severe pain.  Patient has a history of opioid overdose, recommend avoiding long acting opioids at this time due to concern for memory issues.  If pain not relieved with taking hydromorphone around the clock at this dose, may consider Fentanyl patch if patient's sister willing to help him manage it safely.  Patient counseled to take as prescribed (currently he is not taking very often and not taking prescribed dose, taking much less)  - continue acetaminophen 500mg  to 1000mg  PO q8h PRN pain  - continue duloxetine CR 20mg  PO BID to target pain and anxiety, monitor carefully for signs/symptoms of  serotonin syndrome  - continue pregabalin 100mg  PO BID, monitor renal function closely.    - continue tizanidine at dose of 2mg  PO q8h PRN muscle spasms.    -  continue lidocaine 5% patches 12 hours on / 12 hours off  - continue magic mouthwash PRN  - continue Nystatin mouthwash per patient request for oral thrush prevention  - have discontinued hydroxyzine, holding this right now as patient is at high risk of falls and he also did not feel it was helpful  - continue Buspar 20mg  PO BID  - CURES reviewed  - Narcan PRN prescribed at previous visit and we discussed how/when to use in emergencies.  Confirmed that patient has this at home.  - continue Senna 2 tablets PO BID (encouraged Stephen [name] to make sure to take this consistently)  - continue Miralax 17gm PO BID PRN constipation  - continue lactulose 15mL to 30mL PO TID PRN constipation, patient counseled to try taking this 1-2 times a day on his scheduled  - can consider Movantik if constipation does not improve with the above  - continue prochlorperazine 10mg  PO q6h PRN nausea  - continue trazodone 50mg   PO nightly PRN insomnia.   - continue follow up with Pain Management  - Continue Nutrition follow up  - Continue Psychiatry follow up  - Referred to Memory Clinic to address chronic memory issues but noted there is no appointment today  - Patient adamantly declines IHSS or home based palliative care or physical therapy.  Declines wheelchair.  - hospice referral placed for informational visit only  - refilled Flonase nasal spray  - please mychart message or call the palliative care clinic regarding symptom management    Follow up in palliative care clinic on 12/25/23 at 10 AM in person at [facility] (60 minutes)    Patient evaluated with the following [facility] Service Members:  Georjean Mode, LVN    Thank you for your consult.  We will continue to follow this patient and provide longitudinal care, symptom management and psychosocial support.    Shea Niamya Vittitow, MD  Assistant  Professor  [facility] Palliative Care Service  [email or web address]    Total Attending time 60 min:  60 min spent performing physical examination, counseling patient, and coordinating their care with medical team.

## 2023-11-07 ENCOUNTER — Encounter (HOSPITAL_BASED_OUTPATIENT_CLINIC_OR_DEPARTMENT_OTHER): Payer: Self-pay | Admitting: Hematology & Oncology

## 2023-11-07 NOTE — Procedures (Signed)
[facility]  Radiation Oncology  [address]   [place], [place] [address]  [phone] Fax [phone]    Radiation Therapy Summary    Patient: Stephen [name]   Medical Record Number: [id number]      Date of Birth: [birth year]  Radiation Oncologist: Neill Loft  Referring Physician: Brock Ra    Course: 2 Shoulder R  Treatment Site: 1 Shoulder R  Ref. ID: Shoulder RT  Energy: 6X  Dose/Fx (cGy): 800  #Fx: 1 / 1  Dose Correction (cGy): 0  Total Dose (cGy): 800  Start Date: 10/18/2023  End Date: 10/18/2023  Elapsed Days: 0    Shoulder RT - Planned Dose 800cGy - Actual Dose 800cGy    Course and Elapsed Days:2 Shoulder R, 0, day(s).  Treatment Dates: 10/18/2023 - 10/18/2023    Treatment Summary: Stephen [name] received 800 cGy radiotherapy to the   Right Shoulder, using Conformal (Static Field).      Impression and Plan: Please reference the weekly on treatment notes for an   assessment of tumor response and toxicity. Patient was instructed to call   with any questions or concerns in the interim and will return as scheduled   for follow-up.    Signature derived from controlled access password  Gillermo Murdoch, MD - PID [id number]  11/07/2023 9:49:25 PM

## 2023-11-07 NOTE — Telephone Encounter (Signed)
PET scheduled for 11/15

## 2023-11-07 NOTE — Telephone Encounter (Signed)
Secure chat sent to PET scheduler

## 2023-11-08 ENCOUNTER — Encounter (HOSPITAL_BASED_OUTPATIENT_CLINIC_OR_DEPARTMENT_OTHER): Payer: Self-pay | Admitting: Hematology & Oncology

## 2023-11-08 ENCOUNTER — Telehealth (HOSPITAL_BASED_OUTPATIENT_CLINIC_OR_DEPARTMENT_OTHER): Payer: Self-pay

## 2023-11-08 NOTE — Telephone Encounter (Signed)
[facility] Service Palliative Care Note    Coordinating informational hospice visit.  Will inform them to contact pt by phone only.      Referral, facesheet and MD note faxed.    [facility]  Phone:   [phone]  Intake Fax:  [phone]

## 2023-11-09 ENCOUNTER — Encounter (HOSPITAL_BASED_OUTPATIENT_CLINIC_OR_DEPARTMENT_OTHER): Payer: Self-pay | Admitting: Hematology & Oncology

## 2023-11-09 ENCOUNTER — Encounter (HOSPITAL_BASED_OUTPATIENT_CLINIC_OR_DEPARTMENT_OTHER): Payer: Self-pay | Admitting: Family Medicine

## 2023-11-10 ENCOUNTER — Other Ambulatory Visit: Payer: Self-pay

## 2023-11-10 ENCOUNTER — Other Ambulatory Visit: Payer: Self-pay | Admitting: Pharmacist

## 2023-11-10 ENCOUNTER — Encounter (HOSPITAL_BASED_OUTPATIENT_CLINIC_OR_DEPARTMENT_OTHER): Payer: Self-pay | Admitting: Hematology & Oncology

## 2023-11-10 NOTE — Progress Notes (Signed)
Refill Reminder    Indication: ONCOLOGY/HEMATOLOGY  Medication: ONCOLOGY MED balversa    Methods of HIPAA Verification (select 2): Name and DOB    Changes Since Last Visit: None    Is patient ready to refill? Yes, refill initiated in [place].    Adverse Effects    Adverse events reported: No       Has the patient reported experiencing any of the following? N/A    Medication Adherence    Patient reported X missed doses in the last month: 0  Any gaps in refill history greater than 2 weeks in the last 3 months: no  Demonstrates understanding of importance of adherence: yes  Informant: patient  Reliability of informant: reliable  Adherence tools used: calendar  Support network for adherence: healthcare provider  Confirmed plan for next specialty medication refill: pick-up at pharmacy       Stephen [name] has 5 day(s) of medication on hand.  Has Stephen [name] started or stopped any medications (includes Rx, OTC, herbal supplements) since last refill? no  Please confirm the following: Pick up date [phone]    Patient Support    Was any additional patient support provided?: No, patient does not need extra support at this time.         Next scheduled lab draw: none  Next scheduled follow up with provider: none  Patient's current quality of life? (1=poor, 10=excellent) N/A  Does patient need to speak with pharmacist? No intervention necessary.

## 2023-11-10 NOTE — Telephone Encounter (Signed)
RN called pt to further assess.  No answer so RN LVM with callback number.

## 2023-11-12 ENCOUNTER — Other Ambulatory Visit: Payer: Self-pay

## 2023-11-13 ENCOUNTER — Ambulatory Visit
Admission: RE | Admit: 2023-11-13 | Discharge: 2023-11-13 | Disposition: A | Discharge status: Home Health | Payer: Medicare Other | Attending: Hematology & Oncology | Admitting: Hematology & Oncology

## 2023-11-13 ENCOUNTER — Ambulatory Visit (HOSPITAL_BASED_OUTPATIENT_CLINIC_OR_DEPARTMENT_OTHER)
Admit: 2023-11-13 | Discharge: 2023-11-13 | Disposition: A | Discharge status: Home Health | Payer: Medicare Other | Attending: Hematology & Oncology | Admitting: Hematology & Oncology

## 2023-11-13 ENCOUNTER — Other Ambulatory Visit: Payer: Self-pay

## 2023-11-13 ENCOUNTER — Other Ambulatory Visit (HOSPITAL_BASED_OUTPATIENT_CLINIC_OR_DEPARTMENT_OTHER): Payer: Self-pay | Admitting: Family Medicine

## 2023-11-13 DIAGNOSIS — K1230 Oral mucositis (ulcerative), unspecified: Secondary | ICD-10-CM

## 2023-11-13 DIAGNOSIS — C688 Malignant neoplasm of overlapping sites of urinary organs: Secondary | ICD-10-CM | POA: Insufficient documentation

## 2023-11-13 DIAGNOSIS — C689 Malignant neoplasm of urinary organ, unspecified: Secondary | ICD-10-CM

## 2023-11-13 DIAGNOSIS — R590 Localized enlarged lymph nodes: Secondary | ICD-10-CM

## 2023-11-13 DIAGNOSIS — C7910 Secondary malignant neoplasm of unspecified urinary organs: Secondary | ICD-10-CM | POA: Insufficient documentation

## 2023-11-13 LAB — GLUCOSE POCT, BLOOD: Glucose (POCT): 106 mg/dL — ABNORMAL HIGH (ref 70–99)

## 2023-11-13 MED ORDER — NYSTATIN 100000 UNIT/ML MT SUSP
5.0000 mL | Freq: Four times a day (QID) | OROMUCOSAL | 0 refills | Status: DC
Start: 2023-11-13 — End: 2023-12-06
  Filled 2023-11-13 – 2023-11-15 (×2): qty 280, 14d supply, fill #0

## 2023-11-13 MED ORDER — FLUDEOXYGLUCOSE F-18 IV KIT
6.5500 | PACK | Freq: Once | INTRAVENOUS | Status: AC
Start: 2023-11-13 — End: 2023-11-13
  Administered 2023-11-13: 17:00:00 6.55 via INTRAVENOUS
  Filled 2023-11-13: qty 6.55

## 2023-11-13 NOTE — Telephone Encounter (Signed)
[name] Service Progress Note    Received refill request for Nystatin mouthwash 5mL PO QID.  Refill sent to [facility] pharmacy for patient.    Shea Angelyn Osterberg, MD  Assistant Professor  [facility] Palliative Care Service  [email or web address]

## 2023-11-14 ENCOUNTER — Encounter: Payer: Self-pay | Admitting: Family Medicine

## 2023-11-14 ENCOUNTER — Other Ambulatory Visit: Payer: Self-pay

## 2023-11-14 ENCOUNTER — Ambulatory Visit: Payer: Medicare Other

## 2023-11-15 ENCOUNTER — Other Ambulatory Visit: Payer: Self-pay

## 2023-11-15 ENCOUNTER — Ambulatory Visit: Payer: Medicare Other

## 2023-11-15 ENCOUNTER — Ambulatory Visit (HOSPITAL_BASED_OUTPATIENT_CLINIC_OR_DEPARTMENT_OTHER): Payer: Medicare Other

## 2023-11-15 DIAGNOSIS — C642 Malignant neoplasm of left kidney, except renal pelvis: Secondary | ICD-10-CM

## 2023-11-15 DIAGNOSIS — F4320 Adjustment disorder, unspecified: Secondary | ICD-10-CM | POA: Insufficient documentation

## 2023-11-15 DIAGNOSIS — F064 Anxiety disorder due to known physiological condition: Secondary | ICD-10-CM | POA: Insufficient documentation

## 2023-11-15 DIAGNOSIS — M5416 Radiculopathy, lumbar region: Secondary | ICD-10-CM | POA: Insufficient documentation

## 2023-11-15 DIAGNOSIS — C689 Malignant neoplasm of urinary organ, unspecified: Secondary | ICD-10-CM | POA: Insufficient documentation

## 2023-11-15 NOTE — Interdisciplinary (Signed)
Pt walked in requesting assistance with changing nephrostomy bag. Bag changed without issue. 3 extra bags provided for changing at home.

## 2023-11-15 NOTE — Progress Notes (Signed)
Adult Psychiatry Established Patient follow up Appt    Patient name: Stephen [name]  Date: Wednesday November 15, 2023    Clinic: [facility]    HISTORY     HPI: Chief Complaint: Anxiety  HPI: Jahmair Carraher is a 72 year old male with OA, HTN, MDD with L UTUC with metastatic disease ([id number]), with osseous mets (Pet/CT with diffuse enhancement and discrete lesions) who presents to clinic for a psychiatric follow up appointment.   Pt last seen 10/18/23 at which time  Continue Cymbalta 60 mg p.o. b.i.d..    discontinue BuSpar 15 mg p.o. b.i.d.   start BuSpar 20 mg p.o. b.i.d.    Continue Trazodone 50mg  nightly prn insomnia         Interim History:   -pt states he is having ongoing constipation  -pt states ongoing lethargy  -anxiety is ongoig r/t to medical prognosis, had PET scan this morning  -pt states he has contacted hospice, not sure if he intends to call them back  -pt goes to beach multiple times per week, finds comfort at the ocean.  -continues to have motivation for treatment  -pt states trazodone has been helpful for sleep  -pt denies SI but states he is unsure if he wants to do hospice          Past Psychiatric, Family, & Social History Updates:   Substance use:  No tobacco alcohol illicit drug use  Current work/support:   Limited functioning capacity on account of persistent pain.  The patient reports compliance with BuSpar as prescribed.      Past Medical History:  Patient Active Problem List   Diagnosis    Post-traumatic stricture of anterior urethra    Bladder neck contracture    Lumbar radiculopathy    Cervical radiculopathy    Incomplete bladder emptying    Lumbosacral spondylosis without myelopathy    Chronic SI joint pain    Degenerative spondylolisthesis    Lumbar stenosis with neurogenic claudication    Pre-procedure lab exam    Cervical spondylosis without myelopathy    Cervical spondylosis    S/P lumbar fusion    Chronic left SI joint pain    Chronic midline thoracic  back pain    Spondylolisthesis at L5-S1 level    Left renal mass    Malignant neoplasm of urinary bladder, unspecified site (CMS-HCC)    Dorsopathy    Ureter malignant neoplasm, left (CMS-HCC)    Cauda equina compression (CMS-HCC)    COVID-19 virus infection    Community acquired pneumonia    Acute urinary retention    HTN (hypertension)    Gout    MDD (major depressive disorder)    Urothelial cancer (CMS-HCC)    Urinary tract infection associated with indwelling urethral catheter (CMS-HCC)    Ureteral obstruction    Hydronephrosis    Urothelial carcinoma of kidney, left (CMS-HCC)    Myalgia, other site    Trigger point with back pain    Pre-op evaluation    Malignant neoplasm of overlapping sites of bladder (CMS-HCC)    Cancer associated pain    Rhabdomyolysis    Acute kidney injury superimposed on chronic kidney disease (CMS-HCC)    Hypotension    Altered mental status     Review of Systems - Constitutional: fatigue.  Eyes: negative.  Ears, Nose, Mouth, Throat: negative.  CV: negative.  Resp: negative.  GI: negative.  GU: negative.  Musculoskeletal: muscle pain.  Integumentary: itching.  Neuro: negative.  Psych: anxiety.  Endo: negative.   Heme/Lymphatic: negative.  Allergy/Immun: negative.  All others negative     Allergies:   Allergies   Allergen Reactions    Sulfa Drugs Unspecified, Hives and Other       Medications:  Current Outpatient Medications on File Prior to Visit   Medication Sig Dispense Refill    acetaminophen (TYLENOL) 500 MG tablet Take 2 tablets (1,000 mg) by mouth every 8 hours as needed for Mild Pain (Pain Score 1-3) or Moderate Pain (Pain Score 4-6). 40 tablet 0    albuterol (PROAIR HFA) 108 (90 Base) MCG/ACT inhaler ProAir HFA 90 mcg/actuation aerosol inhaler      allopurinol (ZYLOPRIM) 100 MG tablet Take 1 tablet (100 mg) by mouth daily. 30 tablet 0    aluminum-magnesium-simethicone (MAG-AL PLUS) 200-200-20 MG/5ML suspension Mix viscous lidocaine 2%, Mylanta, and Benadryl liquid into a 1:1:1  solution.  Take 10mL by mouth every 4 hours as needed for pain.  Swish and spit or swallow for pain. If swallowed, wait 10 minutes prior to eating/drinking. May also apply medication to a Q-tip and dab on areas of sores. 355 mL 2    Artificial Tear Solution (SOOTHE XP OP)       busPIRone (BUSPAR) 10 MG tablet Take 2 tablets (20 mg) by mouth 2 times daily. 60 tablet 2    calcium acetate (PHOSLO) 667 MG capsule Take 2 capsules (1,334 mg) by mouth 3 times daily (with meals). 180 capsule 2    diclofenac (VOLTAREN) 1 % gel Apply 2 g topically every 6 hours as needed (Right knee pain). 100 g 0    diphenhydrAMINE (BENADRYL) 12.5 MG/5ML liquid Mix viscous lidocaine 2%, Mylanta, and Benadryl liquid into a 1:1:1 solution.  Take 10mL by mouth every 4 hours as needed for pain.  Swish and spit or swallow for pain. If swallowed, wait 10 minutes prior to eating/drinking. May also apply medication to a Q-tip and dab on areas of sores. 236 mL 2    DULoxetine (CYMBALTA) 20 MG CR capsule Take 1 capsule (20 mg) by mouth 2 times daily. 180 capsule 2    Erdafitinib 4 MG TABS Take 8 mg by mouth daily. 60 tablet 3    fluticasone propionate (FLONASE) 50 MCG/ACT nasal spray Spray 2 sprays into each nostril daily. 16 g 2    HYDROmorphone (DILAUDID) 2 MG tablet Take 1 to 2 tablets (2mg  to 4mg ) by mouth every 4 hours as needed for moderate to severe pain 90 tablet 0    ketotifen (ALAWAY) 0.025 % ophthalmic solution Place 1 drop into both eyes 2 times daily.      lactulose 10 GM/15ML solution Take 15mL to 30mL by mouth 3 times a day as needed for constipation (makes stools softer) 473 mL 2    lidocaine (LIDODERM) 5 % patch Apply 1 patch topically every 24 hours. Leave patch on for 12 hours, then remove for 12 hours. 30 patch 2    lidocaine (XYLOCAINE) 2 % solution Mix viscous lidocaine 2%, Mylanta, and Benadryl liquid into a 1:1:1 solution.  Take 10mL by mouth every 4 hours as needed for pain.  Swish and spit or swallow for pain. If swallowed,  wait 10 minutes prior to eating/drinking. May also apply medication to a Q-tip and dab on areas of sores. 200 mL 2    loratadine (CLARITIN REDITABS) 10 MG dissolvable tablet       LORazepam (ATIVAN) 0.5 MG tablet Take 1 tablet by  mouth 30 to 60 minutes prior to MRI.  May repeat dose once if needed. 2 tablet 0    Multiple Vitamins-Minerals (MULTIVITAMIN WITH MINERALS) TABS tablet Take 1 tablet by mouth daily. 30 tablet 1    naloxone (NARCAN) 4 mg/0.1 mL nasal spray For suspected opioid overdose, call 911! Then spray once in one nostril. Repeat after 3 minutes if no or minimal response using a new spray in other nostril. 2 each 0    Nutritional Supplements (NEPRO) LIQD Take 3 Cans by mouth daily. 30000 mL 11    nystatin (MYCOSTATIN) 100,000 units/mL suspension 5 mL (500,000 Units) by Swish & Swallow route 4 times daily. 280 mL 0    [DISCONTINUED] nystatin (MYCOSTATIN) 100,000 units/mL suspension 5 mL (500,000 Units) by Swish & Swallow route 4 times daily. 280 mL 0    polyethylene glycol (MIRALAX) 17 g packet Take 1 packet (17 g) by mouth 2 times daily as needed for Constipation. 1 each 0    pregabalin (LYRICA) 100 MG capsule Take 1 capsule (100 mg) by mouth 2 times daily. 60 capsule 0    prochlorperazine (COMPAZINE) 10 MG tablet Take 1 tablet (10 mg) by mouth every 6 hours as needed (Nausea/Vomiting). 90 tablet 2    senna (SENOKOT) 8.6 MG tablet Take 2 tablets (17.2 mg) by mouth 2 times daily. 120 tablet 2    tizanidine (ZANAFLEX) 2 MG tablet Take 1 tablet (2 mg) by mouth every 8 hours as needed (muscle spasm). 30 tablet 2    traZODone (DESYREL) 50 MG tablet Take one tab nightly by mouth at bedtime as needed for insomnia 90 tablet 0     No current facility-administered medications on file prior to visit.       PSYCHIATRIC SPECIALTY EXAMINATION     Vital Signs:   There were no vitals taken for this visit.    Psychometric Scales:      04/06/2023     5:00 PM 04/27/2023     8:14 PM 05/11/2023    11:00 AM 06/01/2023    11:30 AM  06/05/2023     8:37 AM 06/29/2023     9:53 AM 07/20/2023     1:37 PM   [place] PHQ9 DEPRESSION QUESTIONNAIRE   Interest 0 0 0 2 0 0 0   Depressed 0 0 0 1 0 1 0          06/05/2023     8:36 AM 11/14/2023    11:14 AM   GAD 7   1. Feeling nervous, anxious or on edge 1 1   2. Not being able to stop or control worrying 1 1   3. Worrying too much about different things 1 2   4. Trouble relaxing 1 1   5. Being so restless that it is hard to sit still 0 0   6. Being easily annoyed or irritable 0 0   7. Feeling afraid as if something awful might happen 0 1   GAD7 Patient Total 4 6   If you checked off any problems, how difficult have these problems made it for you to do your job along with other people? Not difficult at all Not difficult at all           No data to display                Pertinent Studies/Labs:    Latest Reference Range & Units 07/06/23 08:26   Sodium 136 - 145 mmol/L 141  Potassium 3.5 - 5.1 mmol/L 4.7   Chloride 98 - 107 mmol/L 102   Bicarbonate 22 - 29 mmol/L 32 (H)   Anion Gap 7 - 15 mmol/L 7   BUN 8 - 23 mg/dL 28 (H)   Creatinine 2.95 - 1.17 mg/dL 6.21   eGFR Based on CKD-EPI 2021 Equation mL/min/1.73 m2 >60   Glucose 70 - 99 mg/dL 91   Calcium 8.5 - 30.8 mg/dL 65.7   Alkaline Phos 40 - 129 U/L 109   ALT (SGPT) 0 - 41 U/L 11   AST (SGOT) 0 - 40 U/L 18   Bilirubin, Tot <1.2 mg/dL 8.46   Albumin 3.5 - 5.2 g/dL 4.3   Total Protein 6.0 - 8.0 g/dL 7.6   (H): Data is abnormally high    Mental Status Examination:   Appearance:  Appropriately groomed for the season, ambulating with a walker, cooperative, frustrated with no evidence of psychomotor agitation.    Behavior:  Help seeking, in pain, able to establish a rapport  Motor/Abnormal Involuntary Movements:  None noted  Gait: not assessed  Speech: understandable/clear, coherent/meaningful, normal rate, and normal volume  Mood: "Still in pain, I am considering hospice"   Affect:  Mood congruent, broad range of affect   Thought Process: Coherent, logical  Associations:  linear and goal-directed  Thought Content:  At this time, the patient does not endorse any core neurovegetative symptoms of depression, including suicidality.  It was not endorse any psychomotor agitation.  He is in agreement with titration of the BuSpar as tolerated to target his residual anxiety.  Perceptions: no abnl  Insight/Judgment:  Good/good   Orientation: AAO x4   Memory: recent and remote memory grossly intact alert and oriented to person time place and situation  Attention/Concentration: no gross abnormalities, adequate  Language: Average based on verbal fluency and interaction   Fund of knowledge and Intellect: Average based on interview and verbal fluency    MEDICAL DECISION MAKING     Assessment:  Challen Maqueda is a 72 year old male with OA, HTN, MDD with L UTUC with metastatic disease ([id number]), with osseous mets (Pet/CT with diffuse enhancement and discrete lesions) who presents to clinic for a psychiatric intake appointment, reporting he can not pain burden and related anxiety in the face of a guarded prognosis and progressive disease.      Patient Strengths: Seeking treatment voluntarily, Supportive/involved family/friends, Well connected to community/professional resources, History of independent functioning, Accepts need for medication/therapeutic interventions, Intact cognition, Has insight into illness, Stable vocational history, History of treatment adherence, Stable living arrangements, and Average intelligence      A comprehensive suicide risk assessment was performed and the patient was assessed to be at a low acute risk of self-harm.  Modifiable risk factors include precipitating stressors and severe medical illness.  Non-modifiable risk factors include existing psychiatric diagnoses, male gender, and older age.  The patient also has protective factors of future life plans, coping skills, and access to health care.        Diagnostic Impression:   1. 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 Adjustment disorder, with anxious mood  3. Urothelial cancer        Plan  At this time, the patient was educated about his premorbid history, his presenting predicament, and his course in his prescribed medication regime. At this time, he is instructed to     Continue Cymbalta 60 mg p.o. b.i.d..    discontinue BuSpar  15 mg p.o. b.i.d.   start BuSpar 20 mg p.o. b.i.d.    Continue Trazodone 50mg  nightly prn insomnia          Labs/Tests/Consultation:  None  Referrals:  None       Additional Planning:  - Provided psychoeducation, supportive therapy and empathic listening today.  - Discussed suicide and emergency room precautions.  - Discussed risks/benefits/alternatives to medication, patient voiced understanding.  - CURES review on 11/16/23 reveals no suspicious activity.      Disposition: Return visit 2 weeks    Horton Finer, NP  Psychiatric Nurse Practitioner  [facility]

## 2023-11-17 ENCOUNTER — Encounter (HOSPITAL_BASED_OUTPATIENT_CLINIC_OR_DEPARTMENT_OTHER): Payer: Self-pay | Admitting: Internal Medicine

## 2023-11-17 ENCOUNTER — Other Ambulatory Visit: Payer: Self-pay

## 2023-11-17 NOTE — Progress Notes (Signed)
Last several days, varying consistency, some hard, small pellets and some loose. 8-9 times a day. Having some cramping in the LUQ. He is taking lactulose.  Instructed to stop lactulose and see how he feels in the next few days. Follow up with Dr. Marca Ancona team on Monday.

## 2023-11-20 ENCOUNTER — Other Ambulatory Visit: Payer: Self-pay

## 2023-11-20 ENCOUNTER — Telehealth (HOSPITAL_BASED_OUTPATIENT_CLINIC_OR_DEPARTMENT_OTHER): Payer: Self-pay

## 2023-11-20 NOTE — Telephone Encounter (Signed)
2 VMs received from pt regarding diarrhea.  RN provided callback.  There was no answer.    RN LVM with her callback number.

## 2023-11-23 ENCOUNTER — Encounter (HOSPITAL_BASED_OUTPATIENT_CLINIC_OR_DEPARTMENT_OTHER): Payer: Self-pay | Admitting: Hematology & Oncology

## 2023-11-23 ENCOUNTER — Other Ambulatory Visit (INDEPENDENT_AMBULATORY_CARE_PROVIDER_SITE_OTHER): Payer: Medicare Other

## 2023-11-23 ENCOUNTER — Ambulatory Visit: Payer: Medicare Other | Attending: Hematology & Oncology | Admitting: Hematology & Oncology

## 2023-11-23 ENCOUNTER — Ambulatory Visit
Admission: RE | Admit: 2023-11-23 | Discharge: 2023-11-23 | Disposition: A | Discharge status: Home / Self Care | Payer: Medicare Other

## 2023-11-23 ENCOUNTER — Other Ambulatory Visit: Payer: Self-pay

## 2023-11-23 VITALS — BP 138/86 | HR 74 | Temp 97.8°F | Resp 16 | Ht 66.14 in | Wt 159.5 lb

## 2023-11-23 DIAGNOSIS — C642 Malignant neoplasm of left kidney, except renal pelvis: Secondary | ICD-10-CM | POA: Insufficient documentation

## 2023-11-23 DIAGNOSIS — C7910 Secondary malignant neoplasm of unspecified urinary organs: Secondary | ICD-10-CM

## 2023-11-23 DIAGNOSIS — R197 Diarrhea, unspecified: Secondary | ICD-10-CM | POA: Insufficient documentation

## 2023-11-23 DIAGNOSIS — L271 Localized skin eruption due to drugs and medicaments taken internally: Secondary | ICD-10-CM | POA: Insufficient documentation

## 2023-11-23 DIAGNOSIS — E860 Dehydration: Secondary | ICD-10-CM | POA: Insufficient documentation

## 2023-11-23 LAB — PHOSPHORUS, BLOOD: Phosphorous: 5.6 mg/dL — ABNORMAL HIGH (ref 2.7–4.5)

## 2023-11-23 LAB — CBC WITH DIFF, BLOOD
ANC-Automated: 4.7 10*3/uL (ref 1.6–7.0)
Abs Basophils: 0 10*3/uL (ref <0.2)
Abs Eosinophils: 0.1 10*3/uL (ref 0.0–0.5)
Abs Lymphs: 1 10*3/uL (ref 0.8–3.1)
Abs Monos: 0.5 10*3/uL (ref 0.2–0.8)
Basophils: 0.5 %
Eosinophils: 1.1 %
Hct: 33.6 % — ABNORMAL LOW (ref 40.0–50.0)
Hgb: 11.3 g/dL — ABNORMAL LOW (ref 13.7–17.5)
Imm Gran %: 0.3 % (ref <1)
Imm Gran Abs: 0 10*3/uL (ref <0.1)
Lymphocytes: 15.6 %
MCH: 30.8 pg (ref 26.0–32.0)
MCHC: 33.6 g/dL (ref 32.0–36.0)
MCV: 91.6 um3 (ref 79.0–95.0)
MPV: 10.6 fL (ref 9.4–12.4)
Monocytes: 7.6 %
Plt Count: 188 10*3/uL (ref 140–370)
RBC: 3.67 10*6/uL — ABNORMAL LOW (ref 4.60–6.10)
RDW: 15.5 % — ABNORMAL HIGH (ref 12.0–14.0)
Segs: 74.9 %
WBC: 6.3 10*3/uL (ref 4.0–10.0)

## 2023-11-23 LAB — COMPREHENSIVE METABOLIC PANEL, BLOOD
ALT (SGPT): 63 U/L — ABNORMAL HIGH (ref 0–41)
AST (SGOT): 65 U/L — ABNORMAL HIGH (ref 0–40)
Albumin: 3.9 g/dL (ref 3.5–5.2)
Alkaline Phos: 140 U/L — ABNORMAL HIGH (ref 40–129)
Anion Gap: 11 mmol/L (ref 7–15)
BUN: 25 mg/dL — ABNORMAL HIGH (ref 8–23)
Bicarbonate: 31 mmol/L — ABNORMAL HIGH (ref 22–29)
Bilirubin, Tot: 0.74 mg/dL (ref <1.2)
Calcium: 10.7 mg/dL — ABNORMAL HIGH (ref 8.5–10.6)
Chloride: 99 mmol/L (ref 98–107)
Creatinine: 1.26 mg/dL — ABNORMAL HIGH (ref 0.67–1.17)
Glucose: 141 mg/dL — ABNORMAL HIGH (ref 70–99)
Potassium: 4.2 mmol/L (ref 3.5–5.1)
Sodium: 141 mmol/L (ref 136–145)
Total Protein: 7.6 g/dL (ref 6.0–8.0)
eGFR Based on CKD-EPI 2021 Equation: >60 mL/min/{1.73_m2}

## 2023-11-23 NOTE — Addendum Note (Signed)
Addended by: Richardson Dopp on: 11/23/2023 03:17 PM     Modules accepted: Orders

## 2023-11-23 NOTE — Patient Instructions (Addendum)
Patient Instructions:    --IV Fluids today at [facility] 3rd floor at 4:30 pm     --Hold Erdafitinib     --Labs today:  CBC, CMP, Phosphorous    -- Return on 12/12 at 2 pm to see Franco Nones, PA    --You have been referred to Radiation Oncology.  Please call [phone] to schedule an appointment.      Homecare Instructions for Prevention of Hand-Foot Syndrome     Keep hands and feet clean and moist  Apply topical alcohol free emollients and creams, like  Bag Balm 2-3 times daily  Avoid topical anesthetics or diphenhydramine (Benadryl) containing creams (they can dry out the skin and can worsen toxicity)   Use mild soaps, such as Dove; do not rub or massage your feet and hands.    Immerse hands and feet in cool water 3 to 4 times per day    Elevate feet to avoid swelling when sitting    Avoid extremes of temperature, pressure, and friction on hands and feet to avoid trauma  Don't wear rings if possible  Don't wear tight-fitting shoes or tight clothes  Avoid using garden tools, household tools such as screwdrivers, and other tasks requiring tools that will cause pressure on your hand from a hard surface  Don't use knives to chop food that may cause excessive pressure and friction on your palms  Don't do aerobic exercise, jog, power walk, or jump  -  avoid long days of walking    Avoid contact with hot water or steam  Laundry  Dishwashing - dishwashing gloves should not be worn, as the rubber will hold heat against your palms  Long showers  Tub baths    Use soft pads for sore skin    Do not apply tight dressing or adhesive tape to skin, such as adhesive bandages    Pay attention to signs of infection (drainage, odor, increased redness, fever).     Call for: signs of infection, pain, tenderness, swelling, or blisters of hands or feet.                      Have questions about Advanced Care Planning/Advance Directive?  PREPARE ([email or web address].org)           Future Appointments   Date Time Provider Department Center    11/27/2023  7:00 AM [facility] CT SCANNER ROOM C1 [facility]   11/29/2023 12:30 PM Horton Finer, NP [facility] Gen Psy [facility]   11/30/2023  2:00 PM Agapito Games, PA [facility]   12/01/2023  8:00 AM West Bali, MD [facility] Neph [place] [facility]   12/25/2023  9:00 AM Shea Evans, MD [facility] Onc [facility]         Visit this website for Cancer resources at our [facility].  This includes information on [place] Chemo Support Groups, Mind/Body Wellness, Treatment Education Classes, and other [facility] Cancer Services:      [email or web address]       *Chief Operating Officer Website:  [email or web address]    Contact Information:   Brock Ra, MD   Assistant Professor of Medicine  Medical Oncology  Genitourinary Malignancy      Franco Nones, M.S., PA-C  Sr. Physician Assistant    Clinical questions or concerns  Oncology Nurse Case Manager   Elsworth Soho RN, BSN  T: [phone]  Department of Urology   [facility]  [address]  [place],  [place] [address]    Dr. Roseanne Reno Follow Up Appointment Assistance  Administrative Assistant   Kevan Rosebush  T: [phone]  F: [phone]    Monday-Friday 8:00- 4:30p.m. (Closed Holidays and Weekends)  Please listen to message carefully for daily information.   Leave a detailed message with your name, medical record number and   telephone number. Messages are usually returned within 24 hours.     [facility] Phone List     AFTER HOURS EMERGENCY NUMBER   Ask for the Oncologist Physician On-Call.                   [phone]    Outpatient Pavilion Scheduling line: [phone]  Call for information about our new location and to schedule or cancel appointments.    [facility] Scheduling line: [phone]   Call to schedule your [name], CT Scans or MRI studies.    PET Scan Scheduling line: [phone]  Call to schedule your PET  scan    Prostate MRI Scheduling line: [phone] or [phone]  Call to schedule your prostate MRI.   Performed at the [facility] ([facility]).    Nuclear Medicine Scheduling Line [phone]   Call to schedule your bone scan, Radium 223     [facility] Infusion Center: [phone]   Call to schedule, cancel, or reschedule chemotherapy appointments.  Infusion Center Hours--- Monday-Friday 7:00-7:30 p.m.; Sat-Sun 8:00-4:30pm.    [facility] (Floors 4 & 9): [phone]  Monday-Friday 730-530 p.m.; Closed Weekends    Radiation Oncology: [phone]    Call to schedule, cancel, or reschedule radiation appointments    Social Worker  [place]: Mathews Robinsons, [place]    T: [phone]  [place]: Henri Medal, LCSW  T: [phone]    Financial Counselors   [phone] at [facility]   [phone] or [phone]      [facility]  [address], [place] [address]    -You can also reach [name] by MyChart. To set up your MyChart please see the following site: [email or web address]  -For cancer support services, please visit out Patient and [facility].  -For [facility] support services, please visit:  -[email or web address]  -For other support from the American Cancer Society, please visit: http://[email or web address]/Treatment/SupportProgramsServices/index              Thank you for allowing [name] to participate in your care today! We value your feedback as we strive to provide every patient with an exceptional care experience.     You may receive a patient satisfaction survey in the mail in the next few weeks. Please fill out the survey upon receipt and return it in the self-addressed envelope. Your feedback will be used to help improve the experience for all our patients at [facility].     If you want to share a positive experience you had at our  facility please call We Listen at [phone]. If you have any suggestions on how we can improve our services please call the [facility] leadership team at [phone].    For medical questions or emergencies, please call your physician's office or 911. Thank you!

## 2023-11-23 NOTE — Interdisciplinary (Signed)
OV, facesheet, and referral faxed to [facility] at [phone]

## 2023-11-23 NOTE — Addendum Note (Signed)
Addended by: Franco Nones on: 11/23/2023 03:10 PM     Modules accepted: Orders

## 2023-11-23 NOTE — Progress Notes (Signed)
Name: Rook Soule   Date of Birth: Apr 23, 1951  Medical Record Number: [id number]    [facility]     Patient ID: Metastatic urothelial carcinoma    Stage: Cancer Staging   Ureter malignant neoplasm, left (CMS-HCC)  Staging form: Renal Pelvis And Ureter, AJCC 8th Edition  - Clinical: Stage IV (cTX, cN0, pM1) - Signed by Cheri Guppy, MD on 12/01/2022    Oncologic History:    04/22/21  CTU: filling defect in L renal pelvis  05/05/21  Cysto with L ureteroscopy revealed a L papillary tumor, biopsied: mixed high (30%) and low grade urothelial carcinoma  06/16/21  Repeat cysto, papillary tumor: pathology low grade papillary [place]  09/xx/22  Completed induction with mitogel (6 cycles)  08/19/21  HGTa of the bladder, no muscle  08/26/21  Repeat TURBT: NED, muscle identified  10/26/21  MRU: mild thickening in the L renal pelvis  12/24/21  Cysto/L ureteroscopy: dysplasia in the bladder; no disease in L ureter  05/06/22  L PCN placed  06/01/22  TURBT and L ureteroscopy: NED, stricture in place, stent placed  09/29/22  TURBT and L ureteroscopy: NED  10/22/22  MRU: Mild diffuse thickening in the L ureter/renal pelvis. Multiple osseous lesions involving the bilateral iliac crest and right acetabulum concerning for osseous metastatic disease.   11/26/22  PET/CT: diffuse osseous FDG activity with discrete osseous lesions concerning for metastatic disease.  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  Also treated for septic shock, AKI (Cr 2.5) and acute liver injury (AST/ALT 2800/2100).   S/p exchange L PCN.   02/07/23  start XRT to R Shoulder  02/10/23  C2D1 EV (dose reduced to 1.0 mg/kg) + pembro 200 mg  IV  03/03/23  C3D1 EV/pembro, missed D8  03/06/23  Cysto: NED.  Cytology NED.   03/14/23  Seen at [facility] ED for dislodged tube, replaced  04/02-05/24  Admitted with weakness, poor PO intake.   04/06/23  C4D1 EV/pembro  04/13/23  C4D8 EV  04/26/23  PET/CT: osseous lesions much improved, many new sclerotic lesions c/w treatment change.  Nonspecific mediastinal and pelvic LN  05/09-11/24  Admitted for treatment of AKI and rhabdomyolysis  05/11/23  C5D1 EV/P DR to 0.75  06/01/23  C6D1 EV/Pembro 0.75  06/23/23  MRI C-Spine: disease in spine, but no fracture or epidural involvement.  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 2. Although there has been interval improvement/resolution of previous multiple lesions in the sternum, spine and pelvis, there are multiple new lesions in L4, right posterior L2, and left lateral 4th and 7th ribs.    Interim History:  [name] is here for follow up.  He is having sores on the bottom of his feet and hands.  He  is also having diarrhea - 8-10 per day  He's not taking lactulose, thinks he's still taking senna    Review of Systems:   A complete ROS was performed and is negative except as documented above.     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  Non smoker  No etoh    Medications:  Current Outpatient Medications on File Prior  to Visit   Medication Sig Dispense Refill    acetaminophen (TYLENOL) 500 MG tablet Take 2 tablets (1,000 mg) by mouth every 8 hours as needed for Mild Pain (Pain Score 1-3) or Moderate Pain (Pain Score 4-6). 40 tablet 0    [DISCONTINUED] albuterol (PROAIR HFA) 108 (90 Base) MCG/ACT inhaler ProAir HFA 90 mcg/actuation aerosol inhaler      allopurinol (ZYLOPRIM) 100 MG tablet Take 1 tablet (100 mg) by mouth daily. 30 tablet 0    aluminum-magnesium-simethicone (MAG-AL PLUS) 200-200-20 MG/5ML suspension Mix viscous lidocaine 2%, Mylanta, and Benadryl liquid into a 1:1:1 solution.  Take 10mL by mouth every 4 hours as needed for pain.  Swish and spit or swallow for pain. If swallowed, wait 10 minutes prior to eating/drinking. May also apply medication to a Q-tip and dab on areas of sores. 355 mL 2    Artificial Tear Solution (SOOTHE XP OP)       busPIRone (BUSPAR) 10 MG tablet Take 2 tablets (20 mg) by mouth 2 times daily. 60 tablet 2    calcium acetate (PHOSLO) 667 MG capsule Take 2 capsules (1,334 mg) by mouth 3 times daily (with meals). 180 capsule 2    [DISCONTINUED] diclofenac (VOLTAREN) 1 % gel Apply 2 g topically every 6 hours as needed (Right knee pain). 100 g 0    diphenhydrAMINE (BENADRYL) 12.5 MG/5ML liquid Mix viscous lidocaine 2%, Mylanta, and Benadryl liquid into a 1:1:1 solution.  Take 10mL by mouth every 4 hours as needed for pain.  Swish and spit or swallow for pain. If swallowed, wait 10 minutes prior to eating/drinking. May also apply medication to a Q-tip and dab on areas of sores. 236 mL 2    DULoxetine (CYMBALTA) 20 MG CR capsule Take 1 capsule (20 mg) by mouth 2 times daily. 180 capsule 2    Erdafitinib 4 MG TABS Take 8 mg by mouth daily. 60 tablet 3    fluticasone propionate (FLONASE) 50 MCG/ACT nasal spray Spray 2 sprays into each nostril daily. 16 g 2    HYDROmorphone (DILAUDID) 2 MG tablet Take 1 to 2 tablets (2mg  to 4mg ) by mouth every 4 hours as needed for moderate to severe pain 90 tablet 0     ketotifen (ALAWAY) 0.025 % ophthalmic solution Place 1 drop into both eyes 2 times daily.      lactulose 10 GM/15ML solution Take 15mL to 30mL by mouth 3 times a day as needed for constipation (makes stools softer) 473 mL 2    lidocaine (LIDODERM) 5 % patch Apply 1 patch topically every 24 hours. Leave patch on for 12 hours, then remove for 12 hours. 30 patch 2    lidocaine (XYLOCAINE) 2 % solution Mix viscous lidocaine 2%, Mylanta,  and Benadryl liquid into a 1:1:1 solution.  Take 10mL by mouth every 4 hours as needed for pain.  Swish and spit or swallow for pain. If swallowed, wait 10 minutes prior to eating/drinking. May also apply medication to a Q-tip and dab on areas of sores. 200 mL 2    [DISCONTINUED] loratadine (CLARITIN REDITABS) 10 MG dissolvable tablet       [DISCONTINUED] LORazepam (ATIVAN) 0.5 MG tablet Take 1 tablet by mouth 30 to 60 minutes prior to MRI.  May repeat dose once if needed. 2 tablet 0    Multiple Vitamins-Minerals (MULTIVITAMIN WITH MINERALS) TABS tablet Take 1 tablet by mouth daily. 30 tablet 1    naloxone (NARCAN) 4 mg/0.1 mL nasal spray For suspected opioid overdose, call 911! Then spray once in one nostril. Repeat after 3 minutes if no or minimal response using a new spray in other nostril. 2 each 0    Nutritional Supplements (NEPRO) LIQD Take 3 Cans by mouth daily. 30000 mL 11    nystatin (MYCOSTATIN) 100,000 units/mL suspension 5 mL (500,000 Units) by Swish & Swallow route 4 times daily. 280 mL 0    polyethylene glycol (MIRALAX) 17 g packet Take 1 packet (17 g) by mouth 2 times daily as needed for Constipation. 1 each 0    pregabalin (LYRICA) 100 MG capsule Take 1 capsule (100 mg) by mouth 2 times daily. 60 capsule 0    prochlorperazine (COMPAZINE) 10 MG tablet Take 1 tablet (10 mg) by mouth every 6 hours as needed (Nausea/Vomiting). 90 tablet 2    senna (SENOKOT) 8.6 MG tablet Take 2 tablets (17.2 mg) by mouth 2 times daily. 120 tablet 2    tizanidine (ZANAFLEX) 2 MG tablet Take  1 tablet (2 mg) by mouth every 8 hours as needed (muscle spasm). 30 tablet 2    traZODone (DESYREL) 50 MG tablet Take one tab nightly by mouth at bedtime as needed for insomnia 90 tablet 0     No current facility-administered medications on file prior to visit.       Physical Examination:   11/23/23  1321   BP: 138/86   Pulse: 74   Temp: 97.8 F (36.6 C)   Resp: 16   SpO2: 98%     ECOG PS 1  General: Here today, with a lot of symptoms from skin changes.   HEENT: Moist mucous membranes without oral lesions.   Neck: No thyromegaly or lymphadenopathy.   Chest: Clear to ausculation bilaterally. No wheezes, rales or rhonchi.    Cardiac: Regular rate and rhythm without any murmurs, rubs or gallops.   Abdomen: Abd soft, nontender without any hepatosplenomegaly.   Extremities: No clubbing, cyanosis, or edema.  Skin: desquamation of palms of hands; ball of feet are tender to palpation  Neurological: Awake, alert and oriented x 3. Strength and sensation are grossly intact.   Psychologic: Appropriate mood and affect.      Labs: Following labs reviewed  Lab Results   Component Value Date    WBC 8.9 11/06/2023    RBC 3.75 (L) 11/06/2023    HGB 11.1 (L) 11/06/2023    HCT 34.9 (L) 11/06/2023    MCV 93.1 11/06/2023    MCHC 31.8 (L) 11/06/2023    RDW 14.6 (H) 11/06/2023    PLT 208 11/06/2023    MPV 10.3 11/06/2023      Lab Results   Component Value Date    BUN 22 11/06/2023    CREAT 1.08 11/06/2023  CL 101 11/06/2023    NA 142 11/06/2023    K 4.4 11/06/2023    Botines 10.8 (H) 11/06/2023    TBILI 0.60 11/06/2023    ALB 4.0 11/06/2023    TP 7.2 11/06/2023    AST 114 (H) 11/06/2023    ALK 165 (H) 11/06/2023    BICARB 30 (H) 11/06/2023    ALT 102 (H) 11/06/2023    GLU 125 (H) 11/06/2023                  Assessment:  Aldrick Averill is a 72 year old male with OA, HTN, MDD with L UTUC with metastatic disease ([id number]), with osseous mets (Pet/CT with diffuse enhancement and discrete lesions). FGFR-TACC mutation.  His lines of therapy  have included:  EV/P --> POD after 8 cycles  erdafitinib    I personally reviewed labs as above.  I personally reviewed scans as above.    [name] is here for scan review and follow up.  Scans overall seem about stable.  There are a couple of new lesions on the bones, and I would like to him to see radiation oncology to consider radiation to his sites of progression in the bones.  Labs show improvement in LFTs and alk phos, seems c/w with treatment response.  As for his treatment, he is clearly having significant toxicity from erda.  We will hold erda x 1 week, and I will see him back next week and we can consider restarting.     Advanced Urothelial Carcinoma, [id number]  - hold erda  - labs today cbc, cmp, phos  - continue phoslo 1333 mg TID  - labs and visits Q2w  - rad onc referral to spine  - consider adding zometa given significnt bone disease; needs dental clearance - he's known to have significant dental issues so this needs to be cleared first  - RTC 1 week  - 1L of fluids if possible    Hand-foot syndrome  - hold therapy as above  - cream for hands    Erda-induced diarrhea  - hold therapy as above  - imodium prn    Cancer related pain, constipation  - follows with palliative care, on multiple medications for pain including dilaudid  - caution given multiple events of AMS    R shoulder pain  - 2/2 metastatic lesion  - seen by ortho 1/22  - s/p XRT to R shoulder    Anxiety  - f/u with palliative care  - monitor closely    RLE edema  - improved    L PCN, initially placed 04/2022  - draining well, will monitor    This visit was spent addressing a complex, chronic illness that poses a threat to life or bodily function to this patient.  I independently reviewed the patient's history since prior visit, reviewed intervening labs and imaging, counseled the patient on the management of their chronic illness, and placed orders to be done prior to next visit.     Onalee Hua has advanced urothelial carcinoma, currently on  erdafitinib that has a risk of causing harm and needs monitoring of cbc, LFTs, electrolytes via labs tests every 2 weeks    Asa Saunas. Roseanne Reno, M.D.  Assistant Professor of Medicine   [employer] [facility] [place]  [facility]  [address]  [place], [place] [address]

## 2023-11-24 ENCOUNTER — Encounter (HOSPITAL_BASED_OUTPATIENT_CLINIC_OR_DEPARTMENT_OTHER): Payer: Self-pay | Admitting: Family Medicine

## 2023-11-27 ENCOUNTER — Ambulatory Visit
Admit: 2023-11-27 | Discharge: 2023-11-27 | Disposition: A | Discharge status: Home Health | Payer: Medicare Other | Attending: Orthopaedic Surgery of the Spine | Admitting: Orthopaedic Surgery of the Spine

## 2023-11-27 ENCOUNTER — Other Ambulatory Visit: Payer: Self-pay

## 2023-11-28 ENCOUNTER — Telehealth (HOSPITAL_BASED_OUTPATIENT_CLINIC_OR_DEPARTMENT_OTHER): Payer: Self-pay

## 2023-11-28 ENCOUNTER — Emergency Department (HOSPITAL_BASED_OUTPATIENT_CLINIC_OR_DEPARTMENT_OTHER): Payer: Medicare Other

## 2023-11-28 ENCOUNTER — Inpatient Hospital Stay
Admission: EM | Admit: 2023-11-28 | Discharge: 2023-12-04 | DRG: 698 | Disposition: A | Discharge status: Home Health | Payer: Medicare Other | Attending: Hospitalist | Admitting: Hospitalist

## 2023-11-28 ENCOUNTER — Inpatient Hospital Stay (HOSPITAL_BASED_OUTPATIENT_CLINIC_OR_DEPARTMENT_OTHER): Payer: Medicare Other

## 2023-11-28 ENCOUNTER — Emergency Department (HOSPITAL_COMMUNITY): Payer: Medicare Other

## 2023-11-28 DIAGNOSIS — Z789 Other specified health status: Secondary | ICD-10-CM

## 2023-11-28 DIAGNOSIS — N179 Acute kidney failure, unspecified: Secondary | ICD-10-CM | POA: Diagnosis present

## 2023-11-28 DIAGNOSIS — N189 Chronic kidney disease, unspecified: Secondary | ICD-10-CM | POA: Diagnosis present

## 2023-11-28 DIAGNOSIS — R001 Bradycardia, unspecified: Secondary | ICD-10-CM | POA: Diagnosis present

## 2023-11-28 DIAGNOSIS — Z87442 Personal history of urinary calculi: Secondary | ICD-10-CM

## 2023-11-28 DIAGNOSIS — Z79899 Other long term (current) drug therapy: Secondary | ICD-10-CM

## 2023-11-28 DIAGNOSIS — J9691 Respiratory failure, unspecified with hypoxia: Secondary | ICD-10-CM | POA: Diagnosis present

## 2023-11-28 DIAGNOSIS — N1330 Unspecified hydronephrosis: Secondary | ICD-10-CM

## 2023-11-28 DIAGNOSIS — Z923 Personal history of irradiation: Secondary | ICD-10-CM

## 2023-11-28 DIAGNOSIS — R531 Weakness: Secondary | ICD-10-CM | POA: Diagnosis present

## 2023-11-28 DIAGNOSIS — C689 Malignant neoplasm of urinary organ, unspecified: Secondary | ICD-10-CM

## 2023-11-28 DIAGNOSIS — Z6821 Body mass index (BMI) 21.0-21.9, adult: Secondary | ICD-10-CM

## 2023-11-28 DIAGNOSIS — D6859 Other primary thrombophilia: Secondary | ICD-10-CM

## 2023-11-28 DIAGNOSIS — M130 Polyarthritis, unspecified: Secondary | ICD-10-CM | POA: Diagnosis present

## 2023-11-28 DIAGNOSIS — R6520 Severe sepsis without septic shock: Secondary | ICD-10-CM | POA: Diagnosis present

## 2023-11-28 DIAGNOSIS — K5641 Fecal impaction: Secondary | ICD-10-CM | POA: Diagnosis present

## 2023-11-28 DIAGNOSIS — T83022A Displacement of nephrostomy catheter, initial encounter: Secondary | ICD-10-CM | POA: Diagnosis present

## 2023-11-28 DIAGNOSIS — D630 Anemia in neoplastic disease: Secondary | ICD-10-CM | POA: Diagnosis present

## 2023-11-28 DIAGNOSIS — Z981 Arthrodesis status: Secondary | ICD-10-CM

## 2023-11-28 DIAGNOSIS — R0602 Shortness of breath: Secondary | ICD-10-CM

## 2023-11-28 DIAGNOSIS — C7951 Secondary malignant neoplasm of bone: Secondary | ICD-10-CM | POA: Diagnosis present

## 2023-11-28 DIAGNOSIS — B9620 Unspecified Escherichia coli [E. coli] as the cause of diseases classified elsewhere: Secondary | ICD-10-CM | POA: Diagnosis present

## 2023-11-28 DIAGNOSIS — N136 Pyonephrosis: Secondary | ICD-10-CM | POA: Diagnosis present

## 2023-11-28 DIAGNOSIS — Z87440 Personal history of urinary (tract) infections: Secondary | ICD-10-CM

## 2023-11-28 DIAGNOSIS — Z9079 Acquired absence of other genital organ(s): Secondary | ICD-10-CM

## 2023-11-28 DIAGNOSIS — Z85528 Personal history of other malignant neoplasm of kidney: Secondary | ICD-10-CM

## 2023-11-28 DIAGNOSIS — Z882 Allergy status to sulfonamides status: Secondary | ICD-10-CM

## 2023-11-28 DIAGNOSIS — Z9049 Acquired absence of other specified parts of digestive tract: Secondary | ICD-10-CM

## 2023-11-28 DIAGNOSIS — Q620 Congenital hydronephrosis: Secondary | ICD-10-CM

## 2023-11-28 DIAGNOSIS — R2681 Unsteadiness on feet: Secondary | ICD-10-CM | POA: Diagnosis present

## 2023-11-28 DIAGNOSIS — M109 Gout, unspecified: Secondary | ICD-10-CM | POA: Diagnosis present

## 2023-11-28 DIAGNOSIS — C786 Secondary malignant neoplasm of retroperitoneum and peritoneum: Secondary | ICD-10-CM

## 2023-11-28 DIAGNOSIS — J9601 Acute respiratory failure with hypoxia: Secondary | ICD-10-CM

## 2023-11-28 DIAGNOSIS — Y738 Miscellaneous gastroenterology and urology devices associated with adverse incidents, not elsewhere classified: Secondary | ICD-10-CM | POA: Diagnosis present

## 2023-11-28 DIAGNOSIS — Z7409 Other reduced mobility: Secondary | ICD-10-CM

## 2023-11-28 DIAGNOSIS — I129 Hypertensive chronic kidney disease with stage 1 through stage 4 chronic kidney disease, or unspecified chronic kidney disease: Secondary | ICD-10-CM | POA: Diagnosis present

## 2023-11-28 DIAGNOSIS — T83512A Infection and inflammatory reaction due to nephrostomy catheter, initial encounter: Principal | ICD-10-CM | POA: Diagnosis present

## 2023-11-28 DIAGNOSIS — D631 Anemia in chronic kidney disease: Secondary | ICD-10-CM | POA: Diagnosis present

## 2023-11-28 DIAGNOSIS — E440 Moderate protein-calorie malnutrition: Secondary | ICD-10-CM | POA: Diagnosis present

## 2023-11-28 DIAGNOSIS — N390 Urinary tract infection, site not specified: Principal | ICD-10-CM | POA: Diagnosis present

## 2023-11-28 DIAGNOSIS — Z556 Problems related to health literacy: Secondary | ICD-10-CM

## 2023-11-28 DIAGNOSIS — C642 Malignant neoplasm of left kidney, except renal pelvis: Secondary | ICD-10-CM

## 2023-11-28 DIAGNOSIS — A419 Sepsis, unspecified organism: Secondary | ICD-10-CM

## 2023-11-28 DIAGNOSIS — A4181 Sepsis due to Enterococcus: Secondary | ICD-10-CM | POA: Diagnosis present

## 2023-11-28 DIAGNOSIS — F32A Depression, unspecified: Secondary | ICD-10-CM | POA: Diagnosis present

## 2023-11-28 DIAGNOSIS — R600 Localized edema: Secondary | ICD-10-CM | POA: Diagnosis present

## 2023-11-28 DIAGNOSIS — Z905 Acquired absence of kidney: Secondary | ICD-10-CM

## 2023-11-28 DIAGNOSIS — F411 Generalized anxiety disorder: Secondary | ICD-10-CM | POA: Diagnosis present

## 2023-11-28 DIAGNOSIS — T83511A Infection and inflammatory reaction due to indwelling urethral catheter, initial encounter: Secondary | ICD-10-CM

## 2023-11-28 DIAGNOSIS — Z9221 Personal history of antineoplastic chemotherapy: Secondary | ICD-10-CM

## 2023-11-28 DIAGNOSIS — M7918 Myalgia, other site: Secondary | ICD-10-CM

## 2023-11-28 DIAGNOSIS — G893 Neoplasm related pain (acute) (chronic): Secondary | ICD-10-CM | POA: Diagnosis present

## 2023-11-28 DIAGNOSIS — C679 Malignant neoplasm of bladder, unspecified: Secondary | ICD-10-CM

## 2023-11-28 LAB — TSH, BLOOD: TSH: 0.4 u[IU]/mL (ref 0.27–4.20)

## 2023-11-28 LAB — COMPREHENSIVE METABOLIC PANEL, BLOOD
ALT (SGPT): 31 U/L (ref 0–41)
AST (SGOT): 34 U/L (ref 0–40)
Albumin: 3.4 g/dL — ABNORMAL LOW (ref 3.5–5.2)
Alkaline Phos: 108 U/L (ref 40–129)
Anion Gap: 12 mmol/L (ref 7–15)
BUN: 30 mg/dL — ABNORMAL HIGH (ref 8–23)
Bicarbonate: 27 mmol/L (ref 22–29)
Bilirubin, Tot: 1.33 mg/dL — ABNORMAL HIGH (ref <1.2)
Calcium: 10.3 mg/dL (ref 8.5–10.6)
Chloride: 97 mmol/L — ABNORMAL LOW (ref 98–107)
Creatinine: 1.74 mg/dL — ABNORMAL HIGH (ref 0.67–1.17)
Glucose: 147 mg/dL — ABNORMAL HIGH (ref 70–99)
Potassium: 4.1 mmol/L (ref 3.5–5.1)
Sodium: 136 mmol/L (ref 136–145)
Total Protein: 6.3 g/dL (ref 6.0–8.0)
eGFR Based on CKD-EPI 2021 Equation: 42 mL/min/{1.73_m2}

## 2023-11-28 LAB — TROPONIN T GEN 5 W/REFLEX TO CK/CKMB
Troponin T Gen 5 w/Reflex CK/CKMB: 37 ng/L — ABNORMAL HIGH (ref <22)
Troponin T Gen 5: 35 ng/L — ABNORMAL HIGH (ref <22)

## 2023-11-28 LAB — CPK-CREATINE PHOSPHOKINASE, BLOOD
CPK: 44 U/L (ref 0–175)
CPK: 37 U/L (ref 0–175)

## 2023-11-28 LAB — URINALYSIS WITH CULTURE REFLEX, WHEN INDICATED
Bilirubin: NEGATIVE
Glucose: NEGATIVE
Ketones: NEGATIVE
Leuk Esterase: 500 Leu/uL — AB
Nitrite: NEGATIVE
Specific Gravity: 1.013 (ref 1.002–1.030)
Urobilinogen: NEGATIVE
WBC: >50 — AB (ref 0–?)
pH: 6.5 (ref 5.0–8.0)

## 2023-11-28 LAB — CBC WITH DIFF, BLOOD
ANC-Manual Mode: 13 10*3/uL — ABNORMAL HIGH (ref 1.6–7.0)
Abs Basophils: 0 10*3/uL (ref <0.2)
Abs Eosinophils: 0 10*3/uL (ref 0.0–0.5)
Abs Lymphs: 0.6 10*3/uL — ABNORMAL LOW (ref 0.8–3.1)
Abs Monos: 0.7 10*3/uL (ref 0.2–0.8)
Basophils: 0 %
Eosinophils: 0 %
Hct: 30 % — ABNORMAL LOW (ref 40.0–50.0)
Hgb: 10.1 g/dL — ABNORMAL LOW (ref 13.7–17.5)
Lymphocytes: 4 %
MCH: 30.6 pg (ref 26.0–32.0)
MCHC: 33.7 g/dL (ref 32.0–36.0)
MCV: 90.9 um3 (ref 79.0–95.0)
MPV: 10.5 fL (ref 9.4–12.4)
Monocytes: 5 %
Plt Count: 154 10*3/uL (ref 140–370)
RBC: 3.3 10*6/uL — ABNORMAL LOW (ref 4.60–6.10)
RDW: 15.2 % — ABNORMAL HIGH (ref 12.0–14.0)
Segs: 91 %
WBC: 14.3 10*3/uL — ABNORMAL HIGH (ref 4.0–10.0)

## 2023-11-28 LAB — CKMB+INDEX (NO CPK), BLOOD
CK-MB Index: 2.3 %
CK-MB: 1 ng/mL (ref 0–5)
CK-MB: <1 ng/mL (ref 0–5)

## 2023-11-28 LAB — PRO BNP, BLOOD: BNPP: 1152 pg/mL — ABNORMAL HIGH (ref 0–899)

## 2023-11-28 LAB — MDIFF
Plt Est: ADEQUATE
RBC Comment: NORMAL

## 2023-11-28 LAB — LACTATE, BLOOD: Lactate: 1.2 mmol/L (ref 0.5–2.0)

## 2023-11-28 MED ORDER — ACETAMINOPHEN 10 MG/ML IV SOLN
1000.0000 mg | Freq: Once | INTRAVENOUS | Status: AC
Start: 2023-11-28 — End: 2023-11-28
  Administered 2023-11-28: 15:00:00 1000 mg via INTRAVENOUS
  Filled 2023-11-28: qty 100

## 2023-11-28 MED ORDER — TRAZODONE HCL 50 MG OR TABS
50.0000 mg | ORAL_TABLET | Freq: Every evening | ORAL | Status: DC | PRN
Start: 2023-11-28 — End: 2023-12-04
  Administered 2023-11-29 – 2023-12-03 (×6): 50 mg via ORAL
  Filled 2023-11-28 (×6): qty 1

## 2023-11-28 MED ORDER — ALLOPURINOL 100 MG OR TABS
100.0000 mg | ORAL_TABLET | Freq: Every day | ORAL | Status: DC
Start: 2023-11-29 — End: 2023-12-04
  Administered 2023-11-29 – 2023-12-04 (×6): 100 mg via ORAL
  Filled 2023-11-28 (×6): qty 1

## 2023-11-28 MED ORDER — NALOXONE HCL 0.4 MG/ML IJ SOLN
0.1000 mg | INTRAMUSCULAR | Status: DC | PRN
Start: 2023-11-28 — End: 2023-12-04

## 2023-11-28 MED ORDER — TIZANIDINE HCL 4 MG OR TABS
2.0000 mg | ORAL_TABLET | Freq: Three times a day (TID) | ORAL | Status: DC | PRN
Start: 2023-11-28 — End: 2023-12-04

## 2023-11-28 MED ORDER — SODIUM CHLORIDE 0.9 % IJ SOLN (CUSTOM)
3.0000 mL | INTRAMUSCULAR | Status: DC | PRN
Start: 2023-11-28 — End: 2023-12-04

## 2023-11-28 MED ORDER — PROCHLORPERAZINE MALEATE 10 MG OR TABS
10.0000 mg | ORAL_TABLET | Freq: Four times a day (QID) | ORAL | Status: DC | PRN
Start: 2023-11-28 — End: 2023-12-04
  Administered 2023-12-01: 18:00:00 10 mg via ORAL
  Filled 2023-11-28: qty 1

## 2023-11-28 MED ORDER — CALCIUM ACETATE 667 MG OR TABS OR CAPS
1334.0000 mg | ORAL_CAPSULE | Freq: Three times a day (TID) | ORAL | Status: DC
Start: 2023-11-29 — End: 2023-11-29
  Administered 2023-11-29 (×2): 1334 mg via ORAL
  Filled 2023-11-28 (×2): qty 2

## 2023-11-28 MED ORDER — BUSPIRONE HCL 10 MG OR TABS
20.0000 mg | ORAL_TABLET | Freq: Two times a day (BID) | ORAL | Status: DC
Start: 2023-11-28 — End: 2023-12-04
  Administered 2023-11-29 – 2023-12-04 (×12): 20 mg via ORAL
  Filled 2023-11-28: qty 1
  Filled 2023-11-28: qty 2
  Filled 2023-11-28 (×4): qty 1
  Filled 2023-11-28: qty 2
  Filled 2023-11-28: qty 1
  Filled 2023-11-28 (×2): qty 2
  Filled 2023-11-28 (×2): qty 1

## 2023-11-28 MED ORDER — HYDROMORPHONE HCL 2 MG OR TABS
2.0000 mg | ORAL_TABLET | ORAL | Status: DC | PRN
Start: 2023-11-28 — End: 2023-12-04
  Administered 2023-11-29 – 2023-12-02 (×5): 2 mg via ORAL
  Filled 2023-11-28 (×5): qty 1

## 2023-11-28 MED ORDER — LACTATED RINGERS IV SOLN
Freq: Once | INTRAVENOUS | Status: AC
Start: 2023-11-28 — End: 2023-11-28

## 2023-11-28 MED ORDER — ALUM & MAG HYDROXIDE-SIMETH 200-200-20 MG/5ML OR SUSP
10.0000 mL | Freq: Four times a day (QID) | ORAL | Status: DC | PRN
Start: 2023-11-28 — End: 2023-12-01

## 2023-11-28 MED ORDER — HEPARIN SODIUM (PORCINE) 5000 UNIT/ML IJ SOLN
5000.0000 [IU] | Freq: Two times a day (BID) | INTRAMUSCULAR | Status: DC
Start: 2023-11-28 — End: 2023-12-04
  Administered 2023-11-29 – 2023-12-04 (×7): 5000 [IU] via SUBCUTANEOUS
  Filled 2023-11-28 (×8): qty 1

## 2023-11-28 MED ORDER — SODIUM CHLORIDE 0.9 % IV SOLN
4500.0000 mg | Freq: Once | INTRAVENOUS | Status: AC
Start: 2023-11-28 — End: 2023-11-28
  Administered 2023-11-28: 16:00:00 4500 mg via INTRAVENOUS
  Filled 2023-11-28: qty 4500

## 2023-11-28 MED ORDER — FLUTICASONE PROPIONATE 50 MCG/ACT NA SUSP
2.0000 | Freq: Every day | NASAL | Status: DC
Start: 2023-11-29 — End: 2023-12-04
  Administered 2023-11-29 – 2023-12-04 (×6): 2 via NASAL
  Filled 2023-11-28 (×2): qty 16

## 2023-11-28 MED ORDER — ACETAMINOPHEN 325 MG PO TABS
650.0000 mg | ORAL_TABLET | ORAL | Status: DC | PRN
Start: 2023-11-28 — End: 2023-12-04

## 2023-11-28 MED ORDER — LINEZOLID 600 MG/300ML IV SOLN
600.0000 mg | Freq: Once | INTRAVENOUS | Status: AC
Start: 2023-11-28 — End: 2023-11-28
  Administered 2023-11-28: 16:00:00 600 mg via INTRAVENOUS
  Filled 2023-11-28: qty 300

## 2023-11-28 MED ORDER — ACETAMINOPHEN 160 MG/5ML OR SOLN
650.0000 mg | ORAL | Status: DC | PRN
Start: 2023-11-28 — End: 2023-12-04

## 2023-11-28 MED ORDER — NYSTATIN 100000 UNIT/ML MT SUSP
5.0000 mL | Freq: Four times a day (QID) | OROMUCOSAL | Status: DC
Start: 2023-11-28 — End: 2023-12-04
  Administered 2023-11-29 – 2023-12-04 (×21): 500000 [IU] via ORAL
  Filled 2023-11-28 (×23): qty 5

## 2023-11-28 MED ORDER — THERA M PLUS OR TABS
1.0000 | ORAL_TABLET | Freq: Every day | ORAL | Status: DC
Start: 2023-11-29 — End: 2023-12-01
  Administered 2023-11-29 – 2023-12-01 (×3): 1 via ORAL
  Filled 2023-11-28 (×3): qty 1

## 2023-11-28 MED ORDER — SODIUM CHLORIDE 0.9 % IV SOLN
4500.0000 mg | Freq: Three times a day (TID) | INTRAVENOUS | Status: DC
Start: 2023-11-28 — End: 2023-12-01
  Administered 2023-11-28 – 2023-12-01 (×8): 4500 mg via INTRAVENOUS
  Filled 2023-11-28 (×8): qty 4500

## 2023-11-28 MED ORDER — LIDOCAINE VISCOUS 2 % MT SOLN
10.0000 mL | Freq: Four times a day (QID) | OROMUCOSAL | Status: DC | PRN
Start: 2023-11-28 — End: 2023-12-04

## 2023-11-28 MED ORDER — DULOXETINE HCL 20 MG OR CPEP
20.0000 mg | ORAL_CAPSULE | Freq: Two times a day (BID) | ORAL | Status: DC
Start: 2023-11-28 — End: 2023-12-04
  Administered 2023-11-29 – 2023-12-04 (×12): 20 mg via ORAL
  Filled 2023-11-28 (×12): qty 1

## 2023-11-28 MED ORDER — SODIUM CHLORIDE 0.9 % IJ SOLN (CUSTOM)
3.0000 mL | Freq: Three times a day (TID) | INTRAMUSCULAR | Status: DC
Start: 2023-11-28 — End: 2023-12-04
  Administered 2023-11-28 – 2023-12-04 (×13): 3 mL via INTRAVENOUS

## 2023-11-28 MED ORDER — HYDROMORPHONE HCL 4 MG OR TABS
4.0000 mg | ORAL_TABLET | ORAL | Status: DC | PRN
Start: 2023-11-28 — End: 2023-12-04
  Administered 2023-11-29 – 2023-12-03 (×7): 4 mg via ORAL
  Filled 2023-11-28: qty 2
  Filled 2023-11-28: qty 1
  Filled 2023-11-28 (×2): qty 2
  Filled 2023-11-28 (×2): qty 1
  Filled 2023-11-28: qty 2

## 2023-11-28 MED ORDER — ACETAMINOPHEN 325 MG PO TABS
500.0000 mg | ORAL_TABLET | Freq: Four times a day (QID) | ORAL | Status: DC | PRN
Start: 2023-11-28 — End: 2023-12-04
  Administered 2023-11-29 – 2023-12-01 (×2): 487.5 mg via ORAL
  Filled 2023-11-28 (×2): qty 2

## 2023-11-28 MED ORDER — ACETAMINOPHEN 650 MG RE SUPP
650.0000 mg | RECTAL | Status: DC | PRN
Start: 2023-11-28 — End: 2023-12-04

## 2023-11-28 MED ORDER — DIPHENHYDRAMINE HCL 12.5 MG/5ML OR ELIX
12.5000 mg | ORAL_SOLUTION | Freq: Four times a day (QID) | ORAL | Status: DC | PRN
Start: 2023-11-28 — End: 2023-12-04
  Filled 2023-11-28 (×2): qty 5

## 2023-11-28 MED ORDER — PREGABALIN 100 MG OR CAPS
100.0000 mg | ORAL_CAPSULE | Freq: Two times a day (BID) | ORAL | Status: DC
Start: 2023-11-28 — End: 2023-12-04
  Administered 2023-11-29 – 2023-12-04 (×12): 100 mg via ORAL
  Filled 2023-11-28 (×12): qty 1

## 2023-11-28 NOTE — Telephone Encounter (Signed)
Callback VM received from the pt.    RN called pt back x2 and LVMs with her callback number.  RN relayed that she would like to further assess his sx to see if we can get in him to the same day care clinic.  If RN is unable to touch base with the pt on the phone and/or if he sx are severe he should go to the ED for expedited evaluation.

## 2023-11-28 NOTE — ED Notes (Signed)
Dr.Dewolf, ICU, at bedside

## 2023-11-28 NOTE — EMS Narrative (Addendum)
 EMS note for [id number] by M30, last updated at 14:18 on 2023-11-28  M30, [name]     1896-12-19     72 Years     56.7   kg                      Patient's symptom(s):  R25.1 - Tremor, unspecified                      Paramedic's impression(s):  R53.1 - Weakness                      Tremors Patient has been having tremors in his hands and legs since last   night. 911 was activated by his sister due to these symptoms appearing in   the past and turning out to be sepsis.  M30 arrived on scene to residence   and found patient sitting in his wheelchair in living room tracking and   AOx4. Patient does not have outward signs of trauma. Patient had some back   and neck pain due to symptoms of his cancer. Legs and hands were shaking,   but PMS was intact.  No treatment utilized. Patient transported to [facility].                        Patient's Medical History:  N17.9 - Acute kidney failure, unspecified                      Patient's Known Allergies:  [id number] -   [id number] - No known allergies                      Paramedic Physical Exam:  Eye (Left) Assessment: PERRL, 3-mm  Eye (Right) Assessment: 3-mm, PERRL  Mental Status Assessment: Normal Baseline for Patient  Neurological Assessment: Normal Baseline for Patient                      Paramedic Procedures:  13:44 - 3-Lead ECG Monitored: Yes                      EMS Response Details:  EMS responded at 12:57  EMS arrived at 12:59

## 2023-11-28 NOTE — Consults (Signed)
ICU ATTENDING CONSULT NOTE     Reason for consult: concern for septic shock, hypoxemic respiratory failure       Consult requested by: Karie Georges     DOS: 11/28/23      HPI: 72 year old male who presented with metastatic urothelial carcinoma. We are consulted for possible septic shock     On erdafitinib and pembro. Has progression on PET/CT Nov 25 24     Came in for shaking in his hands and feet and overall not feeling well. Says he felt like this last time he had a urinary infection. Has nephrostomy bag in place. Draining clear yellow urine.     Has some back pain on the left.     No diarrhea, cough, sick contacts.         Vitals: Blood pressure 93/57, pulse 52, temperature 97.6 F (36.4 C), resp. rate 13, height 5\' 7"  (1.702 m), weight 67 kg (147 lb 11.3 oz), SpO2 97%.Body mass index is 23.13 kg/m.  I/O  No intake/output data recorded.    Physical exam:   Gen: non toxic appearing. Seems a little confused.   HEENT: EOMI, anicteric sclera   CV: brady but regular, normal s1 s2, no m/r/g   Pulm: CTAB, moving air well. No increased work of breathing  Abd: soft, non distended, non tender. No organomegaly  GU: Nephrostomy in place. Draining clear yellow urine    Ext: Warm and well perfused, no edema   Skin: No rash        LABS  Recent Labs     11/28/23  1428   WBC 14.3*   HGB 10.1*   HCT 30.0*   MCV 90.9   PLT 154   SEG 91.0   LYMPHS 4.0   MONOS 5.0   EOS 0.0       Recent Labs     11/28/23  1428   NA 136   K 4.1   CL 97*   BICARB 27   BUN 30*   CREAT 1.74*   GLU 147*   [place] 10.3   TP 6.3   ALB 3.4*   TBILI 1.33*   AST 34   ALT 31   ALK 108           Recent Labs     11/28/23  1428   LACTATE 1.2           Imaging / Studies:  CXR personally reviewed. No consolidation or PTX.         Assessment and Recommendations  72 year old male who presented with metastatic urothelial carcinoma. We are consulted for possible septic shock.     #Severe sepsis   #Urothelial carcinoma   #Bradycardia   #Nephrostomy tube in place    #hypoxemic respiratory failure   MAP is borderline low but maintaining above . S/p 2.5L of fluid but probably could get more. He seems dry and I wonder if he hasn't been eating well at home. If SBP drops again would trial another 1L of fluid. His chest xray looked good, so I think he could handle it from a respiratory perspective. Lactate normal, which is reassuring.     Overall no indication for ICU admission at this time.        Thank you for the opportunity to participate in this patient's care. We are happy to re-evaluate the patient at any time. Please page with any questions or concerns.     Zella Richer, MD  Attending Physician   Pulmonary and Critical Care Medicine

## 2023-11-28 NOTE — H&P (Signed)
HISTORY AND PHYSICAL    Admission Date:  11/28/2023    Attending MD:   Bubba Camp, DO, MPH    Chief Complaint:  Chief Complaint   Patient presents with   . Tremor     BIBA from home after sister activated 911 for tremors. Per medics, last time pt had tremors, he had sepsis. Pt denies fevers, weakness, HA. Hx renal . Lt nephrostomy in place        History of Present Illness:     Stephen [name] is a 72 year old male with metastatic urothelial carcinoma, hypertension, depression, gout, presented to the hospital due to shaking and weakness. Pt states he felt that he was become septic, as he had in the past, and presented to [facility]. Pt admits to diarrhea however states he had recently started taking lactulose which caused him to have frequent loose stools. He has since stopped the lactulose. Pt is a retired Engineer, civil (consulting). Followed with Dr. Roseanne Reno of oncology. Currently receiving treatment for metastatic urothelial carcinoma. His chemotherapy (Erdafitinib 8 mg ) has been on hold x 1 week due to diarrhea. 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 Pt states he is typically very independent, ambulates, cares for his daily activities independently but currently does not drive. Lives home with his sister.     In ED, pt was noted to be hypotensive. Seen by ICU. Given 2.5L IV Fluids, deemed appropriate for IMU level of care      ED Course:   ED Course as of 11/29/23 1154   Others' Documentation   Tue Nov 28, 2023   1507 EKG: No STEMI [JO]   1737 Leuk Esterase(!): 500 [JO]   1737 WBC(!): >50  Positive [JO]   1737 Troponin T Gen 5(!): 37  Stable [JO]   1737 BNPP(!): 1,152  Elevated [JO]   1737 WBC(!): 14.3  Elevated [JO]   1737 Lactate: 1.2  Normal [JO]   1738 Creatinine(!): 1.74  Increased from baseline [JO]   1903 EKG: No STEMI. Sinus brady with PAC [JO]   2007 Pt was evaluated by the ICU, they feel pt is appropriate for medicine.  [JO]      ED Course User Index  [JO] Kandis Nab, MD          ED Clinical Impression as of 11/29/23 1154   Urinary tract infection associated with catheterization of urinary tract, unspecified indwelling urinary catheter type, initial encounter (CMS-HCC)   Urothelial carcinoma of kidney, left (CMS-HCC)        Past Medical and Surgical History:  Past Medical History:   Diagnosis Date   . Chronic back pain    . Congenital hydronephrosis    . Gout    . Headache    . Hematuria    . HTN (hypertension) 12/10/2021   . Kidney disease    . Kidney stones    . Major depressive disorder, single episode    . Polyarthropathy or polyarthritis of multiple sites    . Retinal detachment    . Urethral stricture      Past Surgical History:   Procedure Laterality Date   . CT INSERTION OF SUPRAPUBIC CATH  09/25/2015   . NEPHRECTOMY Right 1955   . APPENDECTOMY     . COLONOSCOPY     . CYSTOSCOPY     . CYSTOSCOPY W/ LASER LITHOTRIPSY     . OTHER SURGICAL HISTORY      Interstim 01/29/2011   .  SPINE SURGERY  09/21    Lumbar-sacral fusion   . TRANSURETHRAL RESECTION OF PROSTATE         Medications:  Prior to Admission Medications:  Prior to Admission Medications   Prescriptions Last Dose Informant Patient Reported? Taking?   Artificial Tear Solution (SOOTHE XP OP)   Yes No   DULoxetine (CYMBALTA) 20 MG CR capsule   No No   Sig: Take 1 capsule (20 mg) by mouth 2 times daily.   Erdafitinib 4 MG TABS   No No   Sig: Take 8 mg by mouth daily.   HYDROmorphone (DILAUDID) 2 MG tablet   No No   Sig: Take 1 to 2 tablets (2mg  to 4mg ) by mouth every 4 hours as needed for moderate to severe pain   Multiple Vitamins-Minerals (MULTIVITAMIN WITH MINERALS) TABS tablet   No No   Sig: Take 1 tablet by mouth daily.   Nutritional Supplements (NEPRO) LIQD   No No   Sig: Take 3 Cans by mouth daily.   acetaminophen (TYLENOL) 500 MG tablet   No No   Sig: Take 2 tablets (1,000 mg) by mouth every 8 hours as needed for Mild Pain (Pain Score 1-3) or Moderate Pain (Pain Score 4-6).   allopurinol (ZYLOPRIM) 100 MG tablet   No No    Sig: Take 1 tablet (100 mg) by mouth daily.   aluminum-magnesium-simethicone (MAG-AL PLUS) 200-200-20 MG/5ML suspension   No No   Sig: Mix viscous lidocaine 2%, Mylanta, and Benadryl liquid into a 1:1:1 solution.  Take 10mL by mouth every 4 hours as needed for pain.  Swish and spit or swallow for pain. If swallowed, wait 10 minutes prior to eating/drinking. May also apply medication to a Q-tip and dab on areas of sores.   busPIRone (BUSPAR) 10 MG tablet   No No   Sig: Take 2 tablets (20 mg) by mouth 2 times daily.   calcium acetate (PHOSLO) 667 MG capsule   No No   Sig: Take 2 capsules (1,334 mg) by mouth 3 times daily (with meals).   diphenhydrAMINE (BENADRYL) 12.5 MG/5ML liquid   No No   Sig: Mix viscous lidocaine 2%, Mylanta, and Benadryl liquid into a 1:1:1 solution.  Take 10mL by mouth every 4 hours as needed for pain.  Swish and spit or swallow for pain. If swallowed, wait 10 minutes prior to eating/drinking. May also apply medication to a Q-tip and dab on areas of sores.   fluticasone propionate (FLONASE) 50 MCG/ACT nasal spray   No No   Sig: Spray 2 sprays into each nostril daily.   ketotifen (ALAWAY) 0.025 % ophthalmic solution   Yes No   Sig: Place 1 drop into both eyes 2 times daily.   lidocaine (LIDODERM) 5 % patch   No No   Sig: Apply 1 patch topically every 24 hours. Leave patch on for 12 hours, then remove for 12 hours.   lidocaine (XYLOCAINE) 2 % solution   No No   Sig: Mix viscous lidocaine 2%, Mylanta, and Benadryl liquid into a 1:1:1 solution.  Take 10mL by mouth every 4 hours as needed for pain.  Swish and spit or swallow for pain. If swallowed, wait 10 minutes prior to eating/drinking. May also apply medication to a Q-tip and dab on areas of sores.   naloxone (NARCAN) 4 mg/0.1 mL nasal spray   No No   Sig: For suspected opioid overdose, call 911! Then spray once in one nostril. Repeat after 3 minutes  if no or minimal response using a new spray in other nostril.   nystatin (MYCOSTATIN) 100,000  units/mL suspension   No No   Sig: 5 mL (500,000 Units) by Swish & Swallow route 4 times daily.   polyethylene glycol (MIRALAX) 17 g packet   Yes No   Sig: Take 1 packet (17 g) by mouth 2 times daily as needed for Constipation.   pregabalin (LYRICA) 100 MG capsule   No No   Sig: Take 1 capsule (100 mg) by mouth 2 times daily.   prochlorperazine (COMPAZINE) 10 MG tablet   No No   Sig: Take 1 tablet (10 mg) by mouth every 6 hours as needed (Nausea/Vomiting).   senna (SENOKOT) 8.6 MG tablet   No No   Sig: Take 2 tablets (17.2 mg) by mouth 2 times daily.   tizanidine (ZANAFLEX) 2 MG tablet   No No   Sig: Take 1 tablet (2 mg) by mouth every 8 hours as needed (muscle spasm).   traZODone (DESYREL) 50 MG tablet   No No   Sig: Take one tab nightly by mouth at bedtime as needed for insomnia      Facility-Administered Medications: None        Current Inpatient Medications:      . acetaminophen  487.5 mg Q6H PRN   . HYDROmorphone  2 mg Q4H PRN   . HYDROmorphone  4 mg Q4H PRN   . nalOXone  0.1 mg Q2 Min PRN       Allergies:  Allergies   Allergen Reactions   . Sulfa Drugs Unspecified, Hives and Other       Social History:  Denies tobacco alcohol or drugs  Lives home with sister  Retired Charity fundraiser     Family History:  Family History   Adopted: Yes   Family history unknown: Yes       Review of Systems:  12 point review of systems was reviewed and negative except per HPI.    Physical Exam:    Vitals:    11/28/23 1934 11/28/23 1936 11/28/23 2000 11/28/23 2100   BP: (!) 88/51 102/60 103/60 108/56   BP Location:  Left arm     BP Patient Position:  Sitting     Pulse: 53 54 51 50   Resp: 14 14 15 14    Temp:  97.7 F (36.5 C)     SpO2: 99% 98% 99% 99%   Weight:       Height:            General: NAD, cooperative, pleasant  HEENT: NCAT, EOMI  Cardiovascular: RRR, Normal S1, S2  Respiratory: CTAB  Abdomen: non-distended, normal bowel sounds all 4 quadrants, soft, nontender, left nephrostomy in place   Extremities: warm, no clubbing, right lower  ext edema  Psych/Mental Status: Mental status WNL, mood WNL  Skin: no obvious lesions    Labs and Other Data:  Na 136 (12/10) CL 97* (12/10) BUN 30* (12/10) GLU   147* (12/10)   K 4.1 (12/10) CO2 27 (12/10) Cr 1.74* (12/10)      TP 6.3 (12/10) ALT 31 (12/10) TBILI 1.33* (12/10) ALK PHOS  108 (12/10)   ALB 3.4* (12/10) AST 34 (12/10) DBILI          WBC 14.3* (12/10) HGB 10.1* (12/10) PLT 154 (12/10)    HCT 30.0* (12/10)        CT Abdomen And Pelvis W/O Contrast  Result Date: 11/28/2023  IMPRESSION: THIS PRELIMINARY  INTERPRETATION HAS NOT BEEN REVIEWED BY AN ATTENDING RADIOLOGIST: Malpositioning of left nephrostomy tube, which terminates in the inferior pole cortex. Moderate left hydronephrosis. Perinephric/periureteral stranding could represent congestion or infection/inflammation. New left basal opacity with air bronchograms, likely representing pneumonia. Large rectal stool ball. Consider disimpaction. Stable osseous metastatic disease. Note that assessment of solid organs is limited in the absence of intravenous contrast.     X-Ray Chest Single View  Result Date: 11/28/2023  IMPRESSION: No pneumonia, oedema or pneumothorax. Subcutaneous venous port remains in place in the right chest wall. Mixed osseous changes with some sclerotic metastases less conspicuous, others new.       Assessment and Care Plan:  Stephen [name] is a 72 year old male with metastatic urothelial carcinoma, hypertension, depression, gout, presented to the hospital due to shaking and weakness. Pt states he felt that he was become septic, as he had in the past, and presented to [facility] ED.       Sepsis   - previously grew pantoea and enterococcus faecalis  - received linizolid and zosyn in the ED, continue zosyn  - f/u blood and urine cultures from nephrostomy    Metastatic urothelial carcinoma   - heme onc consult in the morning, pt has been off of his medication (Erdafitinib 8 mg) has been on hold x 1 week due to diarrhea    AKI   -  follow up cr in AM after 2.5L IV fluids  - avoid nephrotoxins    Bradycardia  - monitor on telemetry  - currently asymptomatic, without dizziness or chest pain  - may benefit from cardiology evaluation in vs outpatient     Hypoxic respiratory failure   - not typically on home oxygen  - currently requiring 2L NC to maintain O2 Sat > 92 %  - continuous pulse ox monitoring    Right lower ext edema  - concerning in hypercoagulable patient (cancer)   - f/u duplex ultrasound     Pain   - continue acetaminophen 500mg  PO q8h prn mild pain  - continue hydromorphone 2mg  1-2 tablets (2mg  to 4mg ) PO q4h Prn moderate- severe pain  - continue  tizanidine 2mg  PO q8h PRN muscle spasms  - continue pregabalin 100mg  PO BID  - continue duloxetine CR 20mg  PO BID  - followed with Pain Management and Orthopedics    Generalized anxiety disorder  - Continue Cymbalta 60 mg p.o. b.i.d  - continue BuSpar 20 mg p.o. q12hrs   - Continue Trazodone 50mg  nightly prn insomnia    Level of care: IMU   Diet: regular  IVF: s/p 2.5L IV Fluids   DVT ppx: heparin q 12hrs  GI ppx:  x   PT/OT: ordered   Consultants: will need heme onc in am     Code Status: full code, would like to continue to discuss   Emergency Contact: sister     All labs and diagnostics were reviewed  Plan of care discussed with patient  Advanced care plan was discussed at length with patient   I attest that patient requires at least 2 midnights stay to complete work up , treatment and plan of care.       Bubba Camp, DO, MPH  Division of Hospital Medicine  11/28/23, 20:59 PM

## 2023-11-28 NOTE — Telephone Encounter (Signed)
VM received from pt, most of the message RN was unable to understand.  Pt is requesting to see Dr. Roseanne Reno today regarding his hands, feet, and fingers.    RN called pt back to further assess.  No answer so RN LVM with her callback number asking for a callback.

## 2023-11-28 NOTE — ED Provider Notes (Signed)
ED Provider Note  [facility] electronic medical record reviewed for pertinent medical history.     Stephen [name] DOB: [birth year] PMD: No Pcp, Per Patient     Chief Complaint   Patient presents with   . Tremor     BIBA from home after sister activated 911 for tremors. Per medics, last time pt had tremors, he had sepsis. Pt denies fevers, weakness, HA. Hx renal [place]. Lt nephrostomy in place       HPI: Stephen [name] is a 72 year old male who has a past medical history of Chronic back pain, Congenital hydronephrosis, Gout, Headache, Hematuria, HTN (hypertension) (12/10/2021), Kidney disease, Kidney stones, Major depressive disorder, single episode, Polyarthropathy or polyarthritis of multiple sites, Retinal detachment, and Urethral stricture.     72 yo M w/ hx of congenital hydronephrosis, urothelial carcinoma with metastatic disease who presents with a chief complaint of generalized shakiness, noting that this symptom previously occurred during a septic episode. The patient has been experiencing significant difficulty getting out of bed and has reported falling twice within the current week. Additionally, there is a report of pain throughout the body, with specific mention of discomfort in the triceps, arms, legs, and toes.    Currently, the patient has a nephrostomy tube in place and has ceased to produce urine through the penis. The patient also reports experiencing a low-grade fever and a general feeling of warmth. There have been no recent changes to the patient's medication regimen.          External Data Sources (Select all that apply):  DC summary from [facility] 11/02/23:   Urinary tract infection with hematuria, site unspecified  Patient presenting with generalized weakness and was found to have urinary tract infection with positive UA showing 20-100 WBCs, positive nitrite else, and moderate leuk esterase.  Noted to have prior history of pseudomonal UTI.    - was started empirically on Cefepime IV and clinically  improved  -- initial urine culture was growing GNR, however final urine cultures showed multiple organisms present.   - ID was consulted to assist with antibiotic plan given potential complex UTI in setting of nephrostomy tube. Per ID, unclear if had true infection and has completed 5 days of Cefepime so no further antibiotics required at this time.   -- Patient will discharge home and no PO antibiotic needed. Return precautions discussed. Follow-up with primary doctor after discharge.   - Routine nephrostomy tube care    Pertinent Medical History:    PMHx: As above    Past Surgical History:   Procedure Laterality Date   . CT INSERTION OF SUPRAPUBIC CATH  09/25/2015   . NEPHRECTOMY Right 1955   . APPENDECTOMY     . COLONOSCOPY     . CYSTOSCOPY     . CYSTOSCOPY W/ LASER LITHOTRIPSY     . OTHER SURGICAL HISTORY      Interstim 01/29/2011   . SPINE SURGERY  09/21    Lumbar-sacral fusion   . TRANSURETHRAL RESECTION OF PROSTATE         Family History   Adopted: Yes   Family history unknown: Yes       Current Outpatient Medications   Medication Instructions   . acetaminophen (TYLENOL) 1,000 mg, Oral, EVERY 8 HOURS PRN   . allopurinol (ZYLOPRIM) 100 mg, Oral, DAILY   . aluminum-magnesium-simethicone (MAG-AL PLUS) 200-200-20 MG/5ML suspension Mix viscous lidocaine 2%, Mylanta, and Benadryl liquid into a 1:1:1 solution.  Take 10mL by mouth  every 4 hours as needed for pain.  Swish and spit or swallow for pain. If swallowed, wait 10 minutes prior to eating/drinking. May also apply medication to a Q-tip and dab on areas of sores.   . Artificial Tear Solution (SOOTHE XP OP)    . [place] 8 mg, Oral, DAILY   . busPIRone (BUSPAR) 20 mg, Oral, 2 TIMES DAILY   . calcium acetate (PHOSLO) 1,334 mg, Oral, 3 TIMES DAILY WITH MEALS   . diphenhydrAMINE (BENADRYL) 12.5 MG/5ML liquid Mix viscous lidocaine 2%, Mylanta, and Benadryl liquid into a 1:1:1 solution.  Take 10mL by mouth every 4 hours as needed for pain.  Swish and spit or swallow  for pain. If swallowed, wait 10 minutes prior to eating/drinking. May also apply medication to a Q-tip and dab on areas of sores.   . DULoxetine (CYMBALTA) 20 mg, Oral, 2 TIMES DAILY   . fluticasone propionate (FLONASE) 50 MCG/ACT nasal spray 2 sprays, Each [place]aris, DAILY   . HYDROmorphone (DILAUDID) 2 MG tablet Take 1 to 2 tablets (2mg  to 4mg ) by mouth every 4 hours as needed for moderate to severe pain   . ketotifen (ALAWAY) 0.025 % ophthalmic solution 1 drop, Both Eyes, 2 TIMES DAILY   . lactulose 10 GM/15ML solution Take 15mL to 30mL by mouth 3 times a day as needed for constipation (makes stools softer)   . lidocaine (LIDODERM) 5 % patch 1 patch, Transdermal, EVERY 24 HOURS, Leave patch on for 12 hours, then remove for 12 hours.   . lidocaine (XYLOCAINE) 2 % solution Mix viscous lidocaine 2%, Mylanta, and Benadryl liquid into a 1:1:1 solution.  Take 10mL by mouth every 4 hours as needed for pain.  Swish and spit or swallow for pain. If swallowed, wait 10 minutes prior to eating/drinking. May also apply medication to a Q-tip and dab on areas of sores.   . Multiple Vitamins-Minerals (MULTIVITAMIN WITH MINERALS) TABS tablet 1 tablet, Oral, DAILY   . naloxone (NARCAN) 4 mg/0.1 mL nasal spray For suspected opioid overdose, call 911! Then spray once in one nostril. Repeat after 3 minutes if no or minimal response using a new spray in other nostril.   . Nutritional Supplements (NEPRO) LIQD 3 Cans, Oral, DAILY   . nystatin (MYCOSTATIN) 500,000 Units, Swish & Swallow, 4 TIMES DAILY   . polyethylene glycol (MIRALAX) 17 g, Oral, 2 TIMES DAILY PRN   . pregabalin (LYRICA) 100 mg, Oral, 2 TIMES DAILY   . prochlorperazine (COMPAZINE) 10 mg, Oral, EVERY 6 HOURS PRN   . senna (SENOKOT) 17.2 mg, Oral, 2 TIMES DAILY   . tizanidine (ZANAFLEX) 2 mg, Oral, EVERY 8 HOURS PRN   . traZODone (DESYREL) 50 MG tablet Take one tab nightly by mouth at bedtime as needed for insomnia       Physical Exam  BP 103/60   Pulse 51   Temp 97.7 F  (36.5 C)   Resp 15   Ht 5\' 7"  (1.702 m)   Wt 67 kg (147 lb 11.3 oz)   SpO2 99%   BMI 23.13 kg/m   Physical Exam  Constitutional:       General: He is not in acute distress.     Appearance: Normal appearance. He is well-developed. He is not ill-appearing, toxic-appearing or diaphoretic.   HENT:      Head: Normocephalic and atraumatic.   Eyes:      Extraocular Movements: Extraocular movements intact.      Comments: Moves eyes spontaneously  Neck:      Comments: Able to touch chin to sternum without pain  Cardiovascular:      Rate and Rhythm: Normal rate and regular rhythm.      Heart sounds: No murmur heard.     No gallop.   Pulmonary:      Effort: Pulmonary effort is normal. No respiratory distress.      Breath sounds: No wheezing, rhonchi or rales.      Comments: Bibasilar crackles  Chest:      Chest wall: No tenderness.   Abdominal:      General: Abdomen is flat.      Palpations: Abdomen is soft. There is no mass.      Tenderness: There is no abdominal tenderness. There is no right CVA tenderness, left CVA tenderness, guarding or rebound.      Comments: Nephrostomy tube on the left   Musculoskeletal:         General: No swelling or tenderness. Normal range of motion.      Cervical back: Normal range of motion and neck supple. No rigidity.      Right lower leg: No edema.      Left lower leg: No edema.      Comments: Moves all extremities   Skin:     General: Skin is warm.   Neurological:      Mental Status: He is alert and oriented to person, place, and time.   Psychiatric:         Mood and Affect: Mood normal.           Orders/Medications    Orders Placed This Encounter   Procedures   . X-Ray Chest Single View   . CT Abdomen And Pelvis W/O Contrast   . CMP   . CBC w/ Diff [place]   . Troponin T Gen 5 w/Reflex to CK/CKMB Green Plasma Separator Tube   . Troponin T Gen 5 w/Reflex to CK/CKMB Green Plasma Separator Tube   . Pro Bnp, Blood Green Plasma Separator Tube   . TSH, Blood - See Instructions   .  Urinalysis with Culture Reflex, when indicated   . CPK, Blood   . CKMB + Index (No CPK), Blood   . CPK, Blood   . CKMB + Index (No CPK), Blood   . MDIFF   . ECG 12 Lead   . ECG 12 Lead       Medications   acetaminophen (OFIRMEV) IV injection 1,000 mg (0 mg IntraVENOUS Completed 11/28/23 1512)   lactated ringers 1,000 mL IV bolus ( IntraVENOUS Completed 11/28/23 1644)   linezolid (ZYVOX) IVPB 600 mg (0 mg IntraVENOUS Completed 11/28/23 1659)   piperacillin-tazobactam (ZOSYN) 4,500 mg in sodium chloride 0.9 % 100 mL IVPB (0 mg IntraVENOUS Completed 11/28/23 1629)   lactated ringers 1,000 mL IV bolus ( IntraVENOUS Completed 11/28/23 1807)   lactated ringers 500 mL IV bolus ( IntraVENOUS Completed 11/28/23 1838)           Medical Decision Making/Assessment/Plan    This is a(n) 72 year old male who has a past medical history of Chronic back pain, Congenital hydronephrosis, Gout, Headache, Hematuria, HTN (hypertension) (12/10/2021), Kidney disease, Kidney stones, Major depressive disorder, single episode, Polyarthropathy or polyarthritis of multiple sites, Retinal detachment, and Urethral stricture. and presents with weakness and a fever of 100.5. Likely urine as source. Labs notable for an elevated creatinine. CT non-con abd/pelvis pending on admission. Pt was given broad spectrum abx. His bp dropped  initially and responded to fluids. Pt did require oxygen after receiving 1L of fluids. ICU was consulted and they felt pt was appropriate for IMU level of care. EKG w/ NSR.     Dx/tx limited by SDOH: Problems related to education and literacy: Literacy Problem : Health literacy. These factors potentially limit the patient's ability to obtain adequate follow up and/or comply with treatment.    ED Course/Updates/Disposition  ED Course as of 11/29/23 1154   Karie Georges Corliss's Documentation   Tue Nov 28, 2023   1507 EKG: No STEMI   1737 Leuk Esterase(!): 500   1737 WBC(!): >50  Positive   1737 Troponin T Gen 5(!):  37  Stable   1737 BNPP(!): 1,152  Elevated   1737 WBC(!): 14.3  Elevated   1737 Lactate: 1.2  Normal   1738 Creatinine(!): 1.74  Increased from baseline   1903 EKG: No STEMI. Sinus brady with PAC   2007 Pt was evaluated by the ICU, they feel pt is appropriate for medicine.          ED Clinical Impression as of 11/29/23 1154   Urinary tract infection associated with catheterization of urinary tract, unspecified indwelling urinary catheter type, initial encounter (CMS-HCC)   Urothelial carcinoma of kidney, left (CMS-HCC)            CMS-HCC/Risk Adjustment Factor Diagnoses       Needs Recertification        Category and Diagnosis From    CMS-HCC 47:  Immunodeficiency, unspecified (CMS-HCC) Miles Costain, MD on 01/10/2022    CMS-HCC 55:  Opioid overdose (CMS-HCC) [facility] on 01/29/2023    CMS-HCC 59:  Major depressive disorder, recurrent, unspecified (CMS-HCC) [place] [facility]                     Consultations - I discussed management, patient care, and disposition planning with the following consulting services : ICU, who recommended medicine admission. .    After discussion with the team of Dr. Lynett Fish of the Medicine [facility] service, the decision was made to admit the patient to the hospital with a diagnosis of Weakness [R53.1]  UTI (urinary tract infection) [N39.0].     Data Reviewed:      Limited Bedside Ultrasound Procedure:   All images archived on [facility] servers.        Risk of Complications and/or Morbidity:                 Kandis Nab, MD  11/28/23 2026

## 2023-11-28 NOTE — ED Notes (Signed)
Dr. Oswald at bedside

## 2023-11-28 NOTE — ED Notes (Signed)
Bed: 20  Expected date:   Expected time:   Means of arrival:   Comments:  Medic, weakness 38 M

## 2023-11-29 ENCOUNTER — Ambulatory Visit: Payer: Medicare Other

## 2023-11-29 ENCOUNTER — Inpatient Hospital Stay (HOSPITAL_BASED_OUTPATIENT_CLINIC_OR_DEPARTMENT_OTHER): Payer: Medicare Other

## 2023-11-29 ENCOUNTER — Encounter (HOSPITAL_BASED_OUTPATIENT_CLINIC_OR_DEPARTMENT_OTHER): Payer: Self-pay

## 2023-11-29 ENCOUNTER — Encounter (HOSPITAL_COMMUNITY): Admission: EM | Disposition: A | Discharge status: Home Health | Payer: Self-pay | Attending: Hospitalist

## 2023-11-29 DIAGNOSIS — T83022A Displacement of nephrostomy catheter, initial encounter: Secondary | ICD-10-CM

## 2023-11-29 DIAGNOSIS — N179 Acute kidney failure, unspecified: Secondary | ICD-10-CM

## 2023-11-29 DIAGNOSIS — C642 Malignant neoplasm of left kidney, except renal pelvis: Secondary | ICD-10-CM

## 2023-11-29 DIAGNOSIS — C679 Malignant neoplasm of bladder, unspecified: Secondary | ICD-10-CM

## 2023-11-29 LAB — CBC WITH DIFF, BLOOD
ANC-Automated: 7.9 10*3/uL — ABNORMAL HIGH (ref 1.6–7.0)
Abs Basophils: 0 10*3/uL (ref <0.2)
Abs Eosinophils: 0.1 10*3/uL (ref 0.0–0.5)
Abs Lymphs: 0.8 10*3/uL (ref 0.8–3.1)
Abs Monos: 0.6 10*3/uL (ref 0.2–0.8)
Basophils: 0.3 %
Eosinophils: 0.8 %
Hct: 25.7 % — ABNORMAL LOW (ref 40.0–50.0)
Hgb: 8.5 g/dL — ABNORMAL LOW (ref 13.7–17.5)
Imm Gran %: 0.5 % (ref <1)
Imm Gran Abs: 0.1 10*3/uL — ABNORMAL HIGH (ref <0.1)
Lymphocytes: 8.4 %
MCH: 30.5 pg (ref 26.0–32.0)
MCHC: 33.1 g/dL (ref 32.0–36.0)
MCV: 92.1 um3 (ref 79.0–95.0)
MPV: 10.4 fL (ref 9.4–12.4)
Monocytes: 6.2 %
Plt Count: 119 10*3/uL — ABNORMAL LOW (ref 140–370)
RBC: 2.79 10*6/uL — ABNORMAL LOW (ref 4.60–6.10)
RDW: 15.3 % — ABNORMAL HIGH (ref 12.0–14.0)
Segs: 83.8 %
WBC: 9.5 10*3/uL (ref 4.0–10.0)

## 2023-11-29 LAB — LACTATE, BLOOD
Lactate: 0.7 mmol/L (ref 0.5–2.0)
Lactate: 1 mmol/L (ref 0.5–2.0)

## 2023-11-29 LAB — COMPREHENSIVE METABOLIC PANEL, BLOOD
ALT (SGPT): 22 U/L (ref 0–41)
AST (SGOT): 20 U/L (ref 0–40)
Albumin: 2.7 g/dL — ABNORMAL LOW (ref 3.5–5.2)
Alkaline Phos: 93 U/L (ref 40–129)
Anion Gap: 6 mmol/L — ABNORMAL LOW (ref 7–15)
BUN: 27 mg/dL — ABNORMAL HIGH (ref 8–23)
Bicarbonate: 30 mmol/L — ABNORMAL HIGH (ref 22–29)
Bilirubin, Tot: 1.36 mg/dL — ABNORMAL HIGH (ref <1.2)
Calcium: 9.3 mg/dL (ref 8.5–10.6)
Chloride: 103 mmol/L (ref 98–107)
Creatinine: 1.63 mg/dL — ABNORMAL HIGH (ref 0.67–1.17)
Glucose: 115 mg/dL — ABNORMAL HIGH (ref 70–99)
Potassium: 3.9 mmol/L (ref 3.5–5.1)
Sodium: 139 mmol/L (ref 136–145)
Total Protein: 5.2 g/dL — ABNORMAL LOW (ref 6.0–8.0)
eGFR Based on CKD-EPI 2021 Equation: 45 mL/min/{1.73_m2}

## 2023-11-29 LAB — ECG 12-LEAD
ATRIAL RATE: 67 {beats}/min
ATRIAL RATE: 47 {beats}/min
P AXIS: 64 degrees
P AXIS: 52 degrees
PR INTERVAL: 180 ms
PR INTERVAL: 202 ms
QRS INTERVAL/DURATION: 98 ms
QRS INTERVAL/DURATION: 96 ms
QT: 386 ms
QT: 420 ms
QTc (Bazett): 407 ms
QTc (Bazett): 371 ms
QTc (Fredericia): 400 ms
QTc (Fredericia): 387 ms
R AXIS: 50 degrees
R AXIS: 48 degrees
T AXIS: 50 degrees
T AXIS: 11 degrees
VENTRICULAR RATE: 67 {beats}/min
VENTRICULAR RATE: 47 {beats}/min

## 2023-11-29 LAB — HEMOGRAM, BLOOD
Hct: 27.6 % — ABNORMAL LOW (ref 40.0–50.0)
Hgb: 8.8 g/dL — ABNORMAL LOW (ref 13.7–17.5)
MCH: 30 pg (ref 26.0–32.0)
MCHC: 31.9 g/dL — ABNORMAL LOW (ref 32.0–36.0)
MCV: 94.2 um3 (ref 79.0–95.0)
MPV: 10.3 fL (ref 9.4–12.4)
Plt Count: 120 10*3/uL — ABNORMAL LOW (ref 140–370)
RBC: 2.93 10*6/uL — ABNORMAL LOW (ref 4.60–6.10)
RDW: 15.3 % — ABNORMAL HIGH (ref 12.0–14.0)
WBC: 9.4 10*3/uL (ref 4.0–10.0)

## 2023-11-29 LAB — PHOSPHORUS, BLOOD: Phosphorous: 3.6 mg/dL (ref 2.7–4.5)

## 2023-11-29 LAB — MAGNESIUM, BLOOD: Magnesium: 1.7 mg/dL (ref 1.6–2.4)

## 2023-11-29 SURGERY — IR CHNG NEPHROSTOMY TUBE
Anesthesia: Local

## 2023-11-29 MED ORDER — MINERAL OIL RE ENEM
1.0000 | ENEMA | Freq: Once | RECTAL | Status: AC
Start: 2023-11-29 — End: 2023-11-29
  Administered 2023-11-29: 13:00:00 1 via RECTAL
  Filled 2023-11-29: qty 1

## 2023-11-29 MED ORDER — LIDOCAINE HCL 2% EX GEL (UROJET)
Status: AC
Start: 2023-11-29 — End: 2023-11-29
  Filled 2023-11-29: qty 10

## 2023-11-29 MED ORDER — IODIXANOL 320 MG/ML IV SOLN
INTRAVENOUS | Status: DC | PRN
Start: 2023-11-29 — End: 2023-11-29
  Administered 2023-11-29: 12:00:00 10 mL

## 2023-11-29 MED ORDER — LIDOCAINE HCL 1 % IJ SOLN
INTRAMUSCULAR | Status: AC
Start: 2023-11-29 — End: 2023-11-29
  Filled 2023-11-29: qty 20

## 2023-11-29 MED ORDER — IODIXANOL 320 MG/ML IV SOLN
INTRAVENOUS | Status: AC
Start: 2023-11-29 — End: 2023-11-29
  Filled 2023-11-29: qty 50

## 2023-11-29 MED ORDER — SODIUM CHLORIDE 0.9 % IV SOLN
Freq: Once | INTRAVENOUS | Status: AC
Start: 2023-11-29 — End: 2023-11-29

## 2023-11-29 MED ORDER — LIDOCAINE HCL 1 % IJ SOLN
INTRAMUSCULAR | Status: DC | PRN
Start: 2023-11-29 — End: 2023-11-29
  Administered 2023-11-29: 12:00:00 10 mL via INTRADERMAL

## 2023-11-29 SURGICAL SUPPLY — 19 items
APPLICATOR CHLORAPREP 26ML, ~~LOC~~ (Prep Solutions)
BAG DRAINAGE NEPHROSTOMY 600ML (Misc Medical Supply) ×1 IMPLANT
CATHETER DRAIN MULTIPURPOSE MAC-LOC 10.2FR X 25CM (Lines/Drains) ×1 IMPLANT
COVER PROBE ISOSILK 6" X 96" INTRA OPERATIVE (Drapes/towels) IMPLANT
DECANTER BAG-A-JET STERILE (Misc Medical Supply) ×1
DRESSING SPONGE 2X2X4 PLY (Dressings/packing) ×2 IMPLANT
DRESSING SPONGE IV 2X2 STRL (Dressings/packing) ×2 IMPLANT
DRESSING TEGADERM HP 4X4 (Dressings/packing) ×1 IMPLANT
DRESSING TEGADERM I.V. ADVANCED 4X4 1688 (Dressings/packing) ×1
GLOVE BIOGEL PI ULTRATOUCH SIZE 6.5 (Gloves/Gowns) ×1 IMPLANT
GLOVE BIOGEL PI ULTRATOUCH SIZE 8 (Gloves/Gowns) ×1 IMPLANT
GUIDEWIRE AMPLATZ SUPER STIFF 0.035" X 75CM, 7CM TAPER, STRAIGHT (Procedural wires/sheaths/catheters/balloons/dilators) ×1 IMPLANT
GUIDEWIRE STARTER J-CURVED FIXED CORE 0.035" X 150CM, 8CM TAPER, 15MM J TIP (Procedural wires/sheaths/catheters/balloons/dilators) IMPLANT
POSITIONER HEAD FOAM 9" DONUT (Misc Medical Supply) ×1 IMPLANT
PREP CHLOROPREP 10.5ML 1-STEP (Prep Solutions) IMPLANT
PROCEDURE PACK - IR NON-VASCULAR (Procedure Packs/kits) ×1 IMPLANT
SOLUTION IV 0.9% NS 500ML (Non-Pharmacy Meds/Solutions) ×1
SUTURE ETHILON 2-0 18" FS (Suture) ×1 IMPLANT
TOWELS OR BLUE 4-PACK STERILE, DISPOSABLE (Drapes/towels)

## 2023-11-29 NOTE — Interdisciplinary (Signed)
OTContact       Row Name 11/29/23 1040       Therapy Contact Note    Contact Time 1040    Therapy not provided at this time as Nursing has deferred therapy at this time.    Additional Comments RN requesting to defer therapy at this time due to hypotension. OT to reapproach as able/medically appropriate.

## 2023-11-29 NOTE — ED Notes (Signed)
Report received from Juan,RN

## 2023-11-29 NOTE — ED Notes (Signed)
Report given to John, RN

## 2023-11-29 NOTE — ED Notes (Signed)
Page sent out to attending MD Fatayerji, regarding clarification of order parameters or miscellaneous order speaking to current plan for care which is to monitor for acute decompensation related to current vital signs as parameter to notify physician for new orders. Will await call back or further orders. Will continue with plan of care.

## 2023-11-29 NOTE — Consults (Signed)
INTERVENTIONAL RADIOLOGY INPATIENT CONSULT NOTE    Assessment/Plan: 72 yo M w/unilateral kidney and malpositioned PCN.     - Procedure: Plan for PCN replacement with ultrasound and fluoroscopy  - Scheduling: Add on, timing to be determined    IR Preprocedure Checklist  - Sedation: Local anesthetic only, possible sedation  - Positioning: Prone  - NPO status: NPO required 8 hours pre-procedurally (clear liquids 2 hours pre-procedurally)  - Anticoagulation: Not currently anticoagulated  - OSA/Airway: No active concerns  - Antibiotics: Currently on antibiotics. No additional antibiotics required  - Allergies:   Allergies   Allergen Reactions    Sulfa Drugs Unspecified, Hives and Other     - Consent: Obtained    Case discussed with [facility] attending physician Dr. Levonne Spiller.      Consulting Provider: Medicine    Reason for Consult: PCN replacement.    History of Present Illness: 72 year old male with metastatic urothelial carcinoma, hypertension, depression, gout, presented to the hospital due to shaking and weakness. Unilateral kidney with dislodged PCN.     Current Inpatient Medications:   allopurinol  100 mg Daily    busPIRone  20 mg Q12H    calcium acetate  1,334 mg TID WC    DULoxetine  20 mg BID    fluticasone propionate  2 spray Daily    [Manual MAR Hold] heparin  5,000 Units Q12H    multivitamin with minerals  1 tablet Daily    nystatin  5 mL 4x Daily    piperacillin-tazobactam (ZOSYN) IVPB  4,500 mg Q8H    pregabalin  100 mg BID    sodium chloride  3 mL Q8H        acetaminophen  650 mg Q4H PRN    Or    acetaminophen  650 mg Q4H PRN    Or    acetaminophen  650 mg Q4H PRN    acetaminophen  487.5 mg Q6H PRN    aluminum-magnesium-simethicone  10 mL Q6H PRN    diphenhydrAMINE  12.5 mg Q6H PRN    HYDROmorphone  2 mg Q4H PRN    HYDROmorphone  4 mg Q4H PRN    lidocaine  10 mL Q6H PRN    nalOXone  0.1 mg Q2 Min PRN    prochlorperazine  10 mg Q6H PRN    sodium chloride  3 mL PRN    tizanidine  2 mg Q8H PRN    traZODone  50 mg  Nightly PRN       Examination:  Temperature:  [97.6 F (36.4 C)-100.5 F (38.1 C)] 98.4 F (36.9 C) (12/11 0600)  Blood pressure (BP): (78-123)/(41-88) 93/63 (12/11 1000)  Heart Rate:  [47-75] 48 (12/11 1000)  Respirations:  [7-21] 14 (12/11 1000)  Pain Score: 6 (12/11 0900)  O2 Device: Nasal cannula (12/11 0945)  O2 Flow Rate (L/min):  [1 l/min-2 l/min] 2 l/min (12/11 0945)  SpO2:  [80 %-99 %] 97 % (12/11 1000)    Labs and Other Data:  Lab Results   Component Value Date    NA 139 11/29/2023    K 3.9 11/29/2023    CL 103 11/29/2023    BICARB 30 (H) 11/29/2023    BUN 27 (H) 11/29/2023    CREAT 1.63 (H) 11/29/2023    GLU 115 (H) 11/29/2023    [place] 9.3 11/29/2023     Lab Results   Component Value Date    WBC 9.4 11/29/2023    HGB 8.8 (L) 11/29/2023  HCT 27.6 (L) 11/29/2023    PLT 120 (L) 11/29/2023     Lab Results   Component Value Date    AST 20 11/29/2023    ALT 22 11/29/2023    ALK 93 11/29/2023    TBILI 1.36 (H) 11/29/2023    TP 5.2 (L) 11/29/2023    ALB 2.7 (L) 11/29/2023     No results found for: "INR", "PTT"    Imaging:  Independent visualization of patient's images shows malpositioned left PCN, hydronephrosis.        Interventional Radiology Pagers:    Working Hours (M-F, 7 am - 5 pm)  [facility] 1st Call: [phone]  [facility] 1st Call: [phone]    After Hours ( M-F, 5 pm - 7 am and weekends)   [facility] - IR Home Call: [phone]     Note Author: Barbaraann Cao Kemo Spruce, 11/29/23, 11:07 AM

## 2023-11-29 NOTE — Brief Op Note (Signed)
BRIEF OPERATIVE NOTE    DATE: 11/29/2023  TIME: 11:58 AM    PREOPERATIVE DIAGNOSIS: Malpositioned PCN    POSTOPERATIVE DIAGNOSIS: PCN replaced/repositioned.     PROCEDURE: PCN Exchange    ATTENDING PHYSICIAN: Minocha  ASSISTANTS(s): Loletta Harper    ANESTHESIA/MEDICATIONS:   Intra-op Administered Meds from 11/28/2023 1158 to 11/29/2023 1158       Date/Time Order Dose Route Action Action by Comments    11/29/2023 1146 PST iodixanol (VISIPAQUE) 320 MG/ML injection 10 mL IntraCATHETER Given Hurley Cisco, [place] --    11/29/2023 1146 PST lidocaine 1% injection 10 mL IntraDERMAL Given [place], Shawn M, [place] --            FINDINGS: Old PCN retraced, new PCN in appropriate position.  See dictation for additional details.    SPECIMENS: See above.    COMPLICATIONS: None immediately    DISPOSITION: Return to floor in stable condition.    PLAN: Routine exchange in 3 months.

## 2023-11-29 NOTE — ED Notes (Signed)
Page sent out to attending regarding persistent bp measurements without new orders to treat low bp, pt in nad, negative for any acute neuro changes, aox4. Will continue with plan of care and await further orders.

## 2023-11-29 NOTE — Interdisciplinary (Signed)
PT Contact       Row Name 11/29/23 1018       Therapy Contact Note    Contact Time 1018    Therapy not provided at this time as Patient medically unstable and unable to participate.;Nursing has deferred therapy at this time.    Additional Comments Pt hypotensive, RN recommends deferral of PT eval. PT to follow up when next available.

## 2023-11-29 NOTE — ED Notes (Signed)
Pt remains aox4, negative for acute neuro changes at this time, attending aware trending bp, no further orders at this time, will continue with plan of care.

## 2023-11-29 NOTE — Interdisciplinary (Signed)
11/29/23 1135   Readmission Risk Assessment   Where was the patient admitted from? * Home   Readmission Within 30 Days of Discharge * No   Recent Hospitalizations (Within Last 6 Months) * Yes   High Risk For Readmission * No   Patient Information   Primary Family/Caregiver Contact Name, Number and Relationship * Self   Permission to Contact * Not Applicable   Stairs/Steps to home * No   Address on Facesheet correct?* Yes   Patient contact phone number on Facesheet correct? * Yes   PCP listed on Facesheet correct? * Yes   Assistive Device * Rollator/4 Wheel [facility]   Home Care Services * No   Patient's Discharge Goal(s) * Home;Home Health   Income Information   Do you have difficulty affording your medications * No   Patient Transportation   Do You Have Transportation Issues/Concerns That Make It Difficult To Get To Your Appointments? * No   Will someone be coming to the hospital to take you home upon discharge?  No  (I may need a taxi.  I usually use medical transport.)   Discharge   Anticipated Discharge Disposition/Needs * Too soon to be determined   Barriers to Discharge * Clinical reason   Family/Caregiver's Assessed for * Not Applicable   Respite Care * Not Applicable   Food Insecurity   Within the past 12 months, you worried that your food would run out before you got the money to buy more. Never true   Within the past 12 months, the food you bought just didn't last and you didn't have money to get more. Never true   Transportation Needs   In the past 12 months, has lack of transportation kept you from medical appointments or from getting medications? no   In the past 12 months, has lack of transportation kept you from meetings, work, or from getting things needed for daily living? No   Housing Stability   In the last 12 months, was there a time when you were not able to pay the mortgage or rent on time? N   In the past 12 months, how many times have you moved where you were living? 0   At any time in  the past 12 months, were you homeless or living in a shelter (including now)? N   Utilities   In the past 12 months has the electric, gas, oil, or water company threatened to shut off services in your home? No   Social Worker Consult   Do you need to see a Child psychotherapist? * No   Initial Assessment   CM Initial Assessment * Completed     Medical Necessity:  Chief Complaint   Patient presents with    Tremor     BIBA from home after sister activated 911 for tremors. Per medics, last time pt had tremors, he had sepsis. Pt denies fevers, weakness, HA. Hx renal [place]. Lt nephrostomy in place       LOS at time of Initial Assessment: 21 Hours  Pt admitted on 11/28/2023  2:00 PM    LACE+ Score: 70    Address verified as discharge address:   [address] [place] [address] [phone] (home)    PCP verified:  No Pcp, Per Patient  No address on file  telephone numberNone  fax numberNone    Pharmacy:  [id number] -  - [address]    PLOF:  States independence  with ADLs.  Uses a cane or 4WW.  [id number] is with him at the hospital.  He lives with his sister.    Hx of SNF placement:  No    Hx of Home health services:  No. If HHC is needed, he would like [facility].    - Use previous home health agency upon discharge (yes/no):     DME (includes ALL home equipment): See above     DISCHARGE PLANNING    Support system:  Sister    Anticipated DC disposition (home, SNF, etc) :  TBD;     Anticipated DC needs (HH, DME, none, etc):  TBD    Anticipated barriers to discharge:   Clinical stability    Transportation: TBD. Usually uses medical transport or cab.            Expected discharge date:  12/13 or 12/02/2023       Naida Sleight, RN

## 2023-11-29 NOTE — ED Notes (Signed)
Dr. Levell July called back and made aware of current pt status including concern of decreasing BP and MAP, no change in mentation and otherwise is asymptomatic. As discussed, Dr. to order fluids and to continue monitoring.

## 2023-11-29 NOTE — Plan of Care (Signed)
Problem: Promotion of Health and Safety  Goal: Promotion of Health and Safety  Description: The patient remains safe, receives appropriate treatment and achieves optimal outcomes (physically, psychosocially, and spiritually) within the limitations of the disease process by discharge.    Information below is the current care plan.  Flowsheets  Taken 11/29/2023 2014  Individualized Interventions/Recommendations #1: Frequent turns to help protect skin & help with pt back pain  Individualized Interventions/Recommendations #2 (if applicable): Monitor vital signs closely & keep SBP >90  Individualized Interventions/Recommendations #3 (if applicable): Encourage rest with clustered care  Taken 11/29/2023 1932  Patient /Family stated Goal: go home  Note:

## 2023-11-29 NOTE — ED Floor Report (Signed)
ED to IP Handoff    Report created by Clide Cliff, RN at 9:08 AM 11/29/2023.     HANDOFF REPORT UPDATE/CHANGES (changes in patient status/care/events prior to transfer)  By who:  Time:   Additional information:                                                                                                                                                     Stephen [name] is a 72 year old male.    Brief Summary of ED Visit (to include focused assessment and neuro status):    73 year old male with metastatic urothelial carcinoma, hypertension, depression, gout, presented to the hospital due to shaking and weakness. Pt states he felt that he was become septic, as he had in the past, and presented to [facility]. Pt admits to diarrhea however states he had recently started taking lactulose which caused him to have frequent loose stools. He has since stopped the lactulose. Pt is a retired Engineer, civil (consulting). Followed with Dr. Roseanne Reno of oncology. Currently receiving treatment for metastatic urothelial carcinoma. His chemotherapy (Erdafitinib 8 mg ) has been on hold x 1 week due to diarrhea. 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 Pt states he is typically very independent, ambulates, cares for his daily activities independently but currently does not drive. Lives home with his sister.     RN shift assessment exceptions to WDL: + weakness + left nephrostomy + utilizes walker + generalized body pain    Any significant events and interventions with responses:  Hypotensive and SB during the night. Patient given additional IVFs. Pt is Aox4.     Radiologic studies not completed: N/A  (None unless otherwise noted)    Chief Complaint   Patient presents with    Tremor     BIBA from home after sister activated 911 for tremors. Per medics, last time pt had tremors, he had sepsis. Pt denies fevers, weakness, HA. Hx renal [place]. Lt nephrostomy in place       Admitted for: UTI    Code Status:  Please refer to In-pt  admitting doctors orders     Level of Care: IMU     Sepsis Section:    Is patient septic? yes If yes, complete below:    BC x 2 drawn? yes  If No explain:  N/A    Repeat lactate needed? no  If Yes, when is it due?  N/A    Have TWO Blood Pressures (with MAPs) been documented AFTER crystalloids completed?  yes     All initial antibiotics given?  yes  If No, explain:  N/A    Amount of IV fluids received 2500 mL total  _________________________________________________________________    Is patient on Heparin? no If yes, complete below:     Time  Heparin bolus was given: N/A    Additional drips patient is on: Zosyn IV abx    Cardiac rhythm: SB/NSR     Oxygen Delivery: Nasal Cannula 2 L/Min    Past Medical History:   Diagnosis Date    Chronic back pain     Congenital hydronephrosis     Gout     Headache     Hematuria     HTN (hypertension) 12/10/2021    Kidney disease     Kidney stones     Major depressive disorder, single episode     Polyarthropathy or polyarthritis of multiple sites     Retinal detachment     Urethral stricture        Past Surgical History:   Procedure Laterality Date    CT INSERTION OF SUPRAPUBIC CATH  09/25/2015    NEPHRECTOMY Right 1955    APPENDECTOMY      COLONOSCOPY      CYSTOSCOPY      CYSTOSCOPY W/ LASER LITHOTRIPSY      OTHER SURGICAL HISTORY      Interstim 01/29/2011    SPINE SURGERY  09/21    Lumbar-sacral fusion    TRANSURETHRAL RESECTION OF PROSTATE         Allergies: Sulfa drugs    ED Fall Risk: (!) Yes    Skin issues:  no    >> If yes, note areas of skin breakdown. See appropriate photos.      Ambulatory:  yes w/ walker    Sitter needed: no    CSSRS Suicide Risk Level Screening in Triage: No Sitter    Does this patient have a history of aggressive behavior and/or is this patient a green banner patient? no    Isolation Required: no     >> If yes , what type of isolation: N/A    Is patient in custody?  no    Is patient in restraints? no    Swallow Screen:      Swallow Screen Result:      Was  the patient made NPO & a Speech Language Pathology Consult Ordered? no    Vitals:    11/29/23 0815 11/29/23 0830 11/29/23 0845 11/29/23 0900   BP: 116/88 115/83 (!) 89/59 100/68   BP Location:       BP Patient Position:       Pulse: 75 67 55 64   Resp: 18 18 14 17    Temp:       SpO2: 94% 94% 96% 95%   Weight:       Height:           Tubes Collected: (S) Green PST, Lavender (11/29/23 0521 : Asencion Noble, RN)    Lab Results   Component Value Date    WBC 9.5 11/29/2023    RBC 2.79 (L) 11/29/2023    HGB 8.5 (L) 11/29/2023    HCT 25.7 (L) 11/29/2023    MCV 92.1 11/29/2023    MCHC 33.1 11/29/2023    RDW 15.3 (H) 11/29/2023    PLT 119 (L) 11/29/2023    MPV 10.4 11/29/2023       Lab Results   Component Value Date    NA 139 11/29/2023    K 3.9 11/29/2023    CL 103 11/29/2023    BICARB 30 (H) 11/29/2023    BUN 27 (H) 11/29/2023    CREAT 1.63 (H) 11/29/2023    GLU 115 (H) 11/29/2023    [place] 9.3 11/29/2023  Lab Results   Component Value Date    PHOS 3.6 11/29/2023    MG 1.7 11/29/2023    LACTATE 0.7 11/29/2023       Lab Results   Component Value Date    CPK 37 11/28/2023    CPK 44 11/28/2023    CKMBH <1 11/28/2023    CKMBH 1 11/28/2023    TROPONIN 35 (H) 11/28/2023    TROPONIN 37 (H) 11/28/2023       No results found for: "PH", "PCO2", "O2CONTENT", "IVHC3", "IVBE", "O2SAT", "UNPH", "UNPCO2", "ARTPH", "ARTPCO2", "ARTO2CNT", "IAHC3", "IABE", "ARTO2SAT", "UNAPH", "UNAPCO2"    No results found for this visit on 11/28/23.      Patient Lines/Drains/Airways Status       Active PICC Line / CVC Line / PIV Line / Drain / Airway / Intraosseous Line / Epidural Line / ART Line / Line Type / Wound / Pressure Ulcer Injury       Name Placement date Placement time Site Days    Port A Cath Single Lumen  - 02/13/23 Right Internal jugular 02/13/23  0908  Internal jugular  289    Peripheral IV - 20 G Right;Upper  Forearm 11/28/23  1555  Forearm  less than 1    Nephrostomy -  Left 03/21/23  1610  Left  252    Nephrostomy -  Left 04/27/23  1900   Left  215    Nephrostomy -  Left #1 10/26/23  1014  Left  33    Stent -  Ureteral (left) 12/24/21  1102  Ureteral (left)  704                        ED Handoff Report is ready for review.  Admitting RN may reach Emergency Department RN, Clide Cliff, RN, at [phone] with any questions.

## 2023-11-29 NOTE — ED Notes (Signed)
Dr. Truong at bedside.

## 2023-11-29 NOTE — Progress Notes (Signed)
MEDICINE PROGRESS NOTE    Hospital day:   1 day - Admitted on: 11/28/2023    Subjective / Interval Events     46M h/o metastatic urothelial carcinoma, HTN, depression, gout, and insomnia here for feeling septic, found to have retraction of L PCN w left hydronephrosis w superimposed stranding, c/f urosepsis.    Doing ok, denies dizziness or lightheadedness. No acute concerns.    Objective        Last 24 hours  Temperature:  [97.6 F (36.4 C)-100.2 F (37.9 C)] 98.1 F (36.7 C) (12/11 1213)  Blood pressure (BP): (78-135)/(41-88) 135/70 (12/11 1400)  Heart Rate:  [46-75] 50 (12/11 1400)  Respirations:  [7-21] 10 (12/11 1400)  Pain Score: 6 (12/11 1329)  O2 Device: None (Room air) (12/11 1400)  O2 Flow Rate (L/min):  [1 l/min-4 l/min] 2 l/min (12/11 1213)  SpO2:  [80 %-99 %] 97 % (12/11 1400)  Urine x0 Stool x0 Emesis x0   12/10 0600 - 12/11 0559  In: 1400 [I.V.:1400]  Out: 1450 [Urine:1450]    Gen: laying in bed, NAD  HEENT: NCAT  CV: bradycardic  RESP: CTAB anteriorly, no increased WOB  ABD: soft, yellow urine in L PCN bag  Ext: slight LE resting tremor; peeling skin on hands     Pertinent Labs & Imaging     WBC 9.4 (12/11) HGB 8.8* (12/11) PLT 120* (12/11)    HCT 27.6* (12/11)      %Neutrophils 83.8 (12/11) %Bands   %Lymphs 8.4 (12/11) %Monos 6.2 (12/11) %Eos 0.8 (12/11) %Basos 0.3 (12/11)  Na 139 (12/11) CL 103 (12/11) BUN 27* (12/11) GLU   115* (12/11)   K 3.9 (12/11) CO2 30* (12/11) Cr 1.63* (12/11)      [place] 9.3 (12/11) Mg 1.7 (12/11) Phos 3.6 (12/11)  TP 5.2* (12/11) ALT 22 (12/11) TBILI 1.36* (12/11) ALK PHOS  93 (12/11)   ALB 2.7* (12/11) AST 20 (12/11) DBILI          CTAP  IMPRESSION:  Interval retraction of the left nephrostomy tube, now terminating in the inferior pole cortex. This contributes to new moderate left hydronephrosis with superimposed perinephric/periureteral stranding, nonspecific but may represent inflammation/infection. Recommend consultation with interventional radiology for consideration  of repositioning     Consolidative left basilar opacities, likely infectious/inflammatory..  Large rectal stool ball without definite evidence of stercoral colitis. Consider disimpaction.  Stable osseous and retroperitoneum metastatic disease.  Note that assessment of solid organs is limited in the absence of intravenous contrast.    Assessment / Plan   46M h/o metastatic urothelial carcinoma, HTN, depression, gout, and insomnia here for feeling septic, found to have retraction of L PCN w left hydronephrosis w superimposed stranding, c/f urosepsis.    # Sepsis 2/2 UTI  - CTAP with moderate left hydronephrosis with superimposed perinephric/periureteral stranding  - UA + with >100k E Coli, sensitivities pending  - pt non-febrile but presenting w same constellation of weakness and shaking as prior sepsis  - S/p Linezolid 12/11, Zosyn (12/11-)  - Pantoea, E Faecalis, Pseudomonas prior, zosyn-susceptible  PLAN  - f/u UA sensitivities  - Continue Zosyn (12/11-)    #AKI on CKD  #Moderate L Hydronephrosis   - s/p 12/11 PCN repositioning, draining well  - BL Cr ~1.2-1.3, 1.74 on admission  - S/p 3.5L fluid resuscitation in ED; suspected pre-renal with possible intrinsic damage 2/2 hydro  PLAN  - CTM Cr    #Large Stool Ball  - was having diarrhea  at home so stopped Lactulose  - wasn't taking miralax/senna  - stool ball appears close to rectum, will attempt to dislodge with enemas  PLAN  - s/p mineral oil enema 12/11    #Metastatic Urothelial Carcinoma  - f/w Dr. Roseanne Reno opt, will f/up continuation of Erdafitinib 8mg    - continue Acetaminophen 500mg  PO q8h prn mild pain  - continue Hydromorphone 2mg  1-2 tablets (2mg  to 4mg ) PO q4h PRN moderate- severe pain  - continue Tizanidine 2mg  PO q8h PRN muscle spasms  - continue Pregabalin 100mg  PO BID     Generalized anxiety disorder  - Continue Cymbalta 60 mg p.o. BID  - continue Buspar 20 mg p.o. q12hrs   - Continue Trazodone 50mg  nightly PRN insomnia    #Gout: home Allopurinol    #Allergies: home Flonase    Level of Care: IMU     Disposition Planning 11/29/2023:   Days to Complete Medical Plan of Care Requiring Ongoing Hospitalization: 2 days out --->Care Needs: Clinical Improvement/Symptom Control: Sepsis                 (Discharge Planning and EDD Info)   Remaining Needs: None    Discharge location: Home   Outpatient Referrals Needed: none      Access:   Port A Cath Single Lumen  - 02/13/23 Right Internal jugular (Active)       Peripheral IV - 20 G Right;Upper  Forearm (Active)       Nephrostomy -  Left (Active)       Nephrostomy -  Left (Active)       Nephrostomy -  Left (Active)       Stent -  Ureteral (left) (Active)         Foley:  PCN  Nephrostomy -  Left (Active)       Nephrostomy -  Left (Active)       Nephrostomy -  Left (Active)     VTE prophylaxis: SQ heparin [Manual MAR Hold] heparin-5,000 Units   IV fluids:       CODE status: Full Code  Last BM:    , , ,   Diet: Diet Regular        Ulyses Jarred MD PGY3 IM

## 2023-11-30 ENCOUNTER — Ambulatory Visit (HOSPITAL_BASED_OUTPATIENT_CLINIC_OR_DEPARTMENT_OTHER): Payer: Medicare Other | Admitting: Physician Assistant

## 2023-11-30 LAB — BASIC METABOLIC PANEL, BLOOD
Anion Gap: 6 mmol/L — ABNORMAL LOW (ref 7–15)
BUN: 23 mg/dL (ref 8–23)
Bicarbonate: 29 mmol/L (ref 22–29)
Calcium: 8.8 mg/dL (ref 8.5–10.6)
Chloride: 104 mmol/L (ref 98–107)
Creatinine: 1.45 mg/dL — ABNORMAL HIGH (ref 0.67–1.17)
Glucose: 91 mg/dL (ref 70–99)
Potassium: 3.9 mmol/L (ref 3.5–5.1)
Sodium: 139 mmol/L (ref 136–145)
eGFR Based on CKD-EPI 2021 Equation: 52 mL/min/{1.73_m2}

## 2023-11-30 LAB — CBC WITH DIFF, BLOOD
ANC-Automated: 5.3 10*3/uL (ref 1.6–7.0)
Abs Basophils: 0 10*3/uL (ref <0.2)
Abs Eosinophils: 0.3 10*3/uL (ref 0.0–0.5)
Abs Lymphs: 1.1 10*3/uL (ref 0.8–3.1)
Abs Monos: 0.5 10*3/uL (ref 0.2–0.8)
Basophils: 0.4 %
Eosinophils: 3.8 %
Hct: 25.4 % — ABNORMAL LOW (ref 40.0–50.0)
Hgb: 8.3 g/dL — ABNORMAL LOW (ref 13.7–17.5)
Imm Gran %: 0.3 % (ref <1)
Imm Gran Abs: 0 10*3/uL (ref <0.1)
Lymphocytes: 15.6 %
MCH: 30.6 pg (ref 26.0–32.0)
MCHC: 32.7 g/dL (ref 32.0–36.0)
MCV: 93.7 um3 (ref 79.0–95.0)
MPV: 9.8 fL (ref 9.4–12.4)
Monocytes: 7.1 %
Plt Count: 111 10*3/uL — ABNORMAL LOW (ref 140–370)
RBC: 2.71 10*6/uL — ABNORMAL LOW (ref 4.60–6.10)
RDW: 15.2 % — ABNORMAL HIGH (ref 12.0–14.0)
Segs: 72.8 %
WBC: 7.2 10*3/uL (ref 4.0–10.0)

## 2023-11-30 LAB — MAGNESIUM, BLOOD: Magnesium: 1.6 mg/dL (ref 1.6–2.4)

## 2023-11-30 LAB — PHOSPHORUS, BLOOD: Phosphorous: 3.5 mg/dL (ref 2.7–4.5)

## 2023-11-30 LAB — MRSA SURVEILLANCE CULTURE

## 2023-11-30 MED ORDER — HYDROCERIN EX CREA
TOPICAL_CREAM | Freq: Three times a day (TID) | CUTANEOUS | Status: DC
Start: 2023-11-30 — End: 2023-12-04
  Filled 2023-11-30 (×2): qty 454

## 2023-11-30 MED ORDER — PEG 3350-KCL-NABCB-NACL-NASULF 236 GM OR SOLR
4000.0000 mL | Freq: Once | ORAL | Status: AC
Start: 2023-11-30 — End: 2023-11-30
  Administered 2023-11-30: 12:00:00 4000 mL via ORAL
  Filled 2023-11-30: qty 4000

## 2023-11-30 NOTE — Interdisciplinary (Signed)
Physical Therapy Evaluation and Discharge    Admitting Physician:  Ronette Deter, MD  Admission Date 11/28/2023    Inpatient Diagnosis:   Problem List         Codes    Impaired functional mobility, balance, gait, and endurance     ICD-10-CM: Z74.09  ICD-9-CM: V49.89            IP Start of Service   Start of Care: 11/30/23  Onset Date: 12/10  Reason for referral: Activity tolerance limitation;Decline in functional ability/mobility    Preferred Language:English         Past Medical History:   Diagnosis Date    Chronic back pain     Congenital hydronephrosis     Gout     Headache     Hematuria     HTN (hypertension) 12/10/2021    Kidney disease     Kidney stones     Major depressive disorder, single episode     Polyarthropathy or polyarthritis of multiple sites     Retinal detachment     Urethral stricture       Past Surgical History:   Procedure Laterality Date    CT INSERTION OF SUPRAPUBIC CATH  09/25/2015    NEPHRECTOMY Right 1955    APPENDECTOMY      COLONOSCOPY      CYSTOSCOPY      CYSTOSCOPY W/ LASER LITHOTRIPSY      OTHER SURGICAL HISTORY      Interstim 01/29/2011    SPINE SURGERY  09/21    Lumbar-sacral fusion    TRANSURETHRAL RESECTION OF PROSTATE          PT Acute       Row Name 11/30/23 1500          Type of Visit    Type of Physical Therapy note Physical Therapy Evaluation and Discharge       Row Name 11/30/23 1500          Treatment Precautions/Restrictions    Precautions/Restrictions Fall;Multiple lines     Fall Socks/charm       Row Name 11/30/23 1500          Medical History    History of presenting condition 75M h/o metastatic urothelial carcinoma, HTN, depression, gout, and insomnia here for feeling septic, found to have retraction of L PCN w left hydronephrosis w superimposed stranding, c/f urosepsis.       Row Name 11/30/23 1500          Functional History    Prior Level of Function Minimal deficits     Equipment required for mobility in the home Lupita Shutter Name 11/30/23 1500           Social History    Living Situation Lives with parent/family     Home Environment House     Home accessibility  Accessible with wheelchair or walker;Performs activities of daily living (ADL's) on one level     Other Social History Information lives with sister in [facility] with 0 STE       Row Name 11/30/23 1500          Subjective    Subjective Information my L leg gives out     Patient status Patient agreeable to treatment;Nursing in agreement for treatment       Row Name 11/30/23 1500          Pain Assessment    Pain Asssessment Tool Numeric Pain  Rating Scale       Row Name 11/30/23 1500          Numeric Pain Rating Scale    Pain Intensity - rating at present 8     Pain Intensity- rating after treatment 8     Location reports chronic pain       Row Name 11/30/23 1500          Objective    Overall Cognitive Status Intact - no cognitive limitations or impairments noted     Communication No communication limitations or impairments noted. Current status of hearing, speech and vision allow functional communication.     Coordination/Motor control No limitations or impairments noted. Movement patterns are fluid and coordinated throughout     Balance Balance limitations present     Static Sitting Balance Normal - able to maintain steady balance without handhold support     Dynamic Sitting Balance Good - accepts moderate challenge, able to maintain balance while picking object off floor     Static Standing Balance Fair - able to maintain balance with handhold support, may require occasional minimal assistance     Dynamic Standing Balance Fair - accepts minimal challenge, able to maintain balance while turning head/trunk     Other Balance Information stanjding balance with FWW     Extremity Assessment Range of motion, strength,  muscle tone and/or sensation limitations present     Other  Extremity Assessment  Information 4-5/5     Functional Mobility Functional mobility deficits present     Bed Mobility Independent     Bed  Mobility Comments supine to sit independent     Transfers to/from Stand Independent     Transfer Comments sit to stand with FWW independent     Gait Supervised     Gait Comments cues for upright posture, knees flexed     Device used for ambulation/mobility Front wheeled walker     Ambulation Distance 232ft, around unit x 2     Other Objective Findings mobilized as above, MAP 72, recommended frequent ambulation with staff, declined HH PT, left supine, RN notified                          Eval cont.       Row Name 11/30/23 1500          [place] AM-PAC: Basic Mobility    Assistance Needed to Turn from Back to Side While in a Flat Bed Without Using Bedrails 4 - None (independent)     Difficulty with Supine to Sit Transfer 4 - None (independent)     How Much Help Needed to Move to/from Bed to Chair 3 - A little (supervised/min assist)     Difficulty with Sit to Stand Transfer from Chair with Arms 3 - A little (supervised/min assist)     How Much Help Needed to Walk in Room 3 - A little (supervised/min assist)     How Much Help Needed to Climb 3-5 Steps with a Rail 3 - A little (supervised/min assist)     AMPAC Total Score 20     Assessment: AM-PAC Basic Mobility Impairment Rating Score 19-22 - 20-39% impaired       Row Name 11/30/23 1500          Patient/Family Education    Learner(s) Patient     Learner response to rehab patient education interventions Verbalizes understanding;Able to return demonstrate teaching  Patient/family training comments recommended frequent ambulation with staff       Row Name 11/30/23 1500          Assessment    Assessment Patient able to ambulate in hallway with FWW supervised. PT will sign off.  POST ACUTE REHABILITATION DISCHARGE RECOMMENDATIONS:    Recommended Post-Acute Therapy Follow up:   None    Patient appropriate for discharge to prior living situation    Deficits :         Other Post Acute Discharge Recommendations : declined HH PT, DME in place    Equipment recommendations:    None         Rehab Potential Good       Row Name 11/30/23 1500          Patient stated Goal    Patient stated goal to go home       Row Name 11/30/23 1500          Treatment Plan    Frequency of treatment Patient appropriate for discharge from therapy     Duration of treatment (number of visits) One time only, further treatment not indicated     Status of treatment Patient appropriate for discharge from therapy       Row Name 11/30/23 1500          Patient Safety Considerations     Patient safety considerations Patient returned to bed at end of treatment;Call light left in reach and fall precautions in place;Nursing notified of safety considerations at end of treatment     Patient assistive device requirements for safe ambulation Lupita Shutter Name 11/30/23 1500          Therapy Plan Communication    Therapy Plan Communication Discussed therapy plan with Nursing and/or Physician       Row Name 11/30/23 1500          Physical Therapy Patient Discharge Instructions    Your Physical Therapist suggests the following Supervision with walking is suggested for increased safety       Row Name 11/30/23 1500          Type of Eval    Low Complexity [phone]) Completed       Row Name 11/30/23 1500          Therapeutic Procedures    Gait Training ([id number]) Assistive device training;Gait pattern analysis and treatment of deviations;Patient education        Total TIMED Treatment (min)  15     Therapeutic Activities [phone])  Assistance/facilitation of bed mobility;Dynamic activities to improve performance of  functional tasks/activities;Facillitation of safety awareness/responses during functional tasks;Functional activities;Patient education;Progressive mobilization to improve functional independence;Transfer training with weight shift and direction change        Total TIMED Treatment (min)  15       Row Name 11/30/23 1500          Treatment Time     Total TIMED Treatment  (min) 30     Total Treatment Time (min) 45     Treatment  start time 1500                        The physical therapist of record is endorsed by evaluating physical therapist.

## 2023-11-30 NOTE — Interdisciplinary (Signed)
Occupational Therapy Evaluation    Admitting Physician:  Ronette Deter, MD  Admission Date 11/28/2023    Inpatient Diagnosis:   Problem List         Codes    Impaired functional mobility, balance, gait, and endurance     ICD-10-CM: Z74.09  ICD-9-CM: V49.89    Decreased activities of daily living (ADL)     ICD-10-CM: Z78.9  ICD-9-CM: V49.89            IP Start of Service  Start of Care: 11/30/23  Onset Date: 11/28/2023  Reason for referral: Decline in performance of activities of daily living (ADL);Activity tolerance limitation;Decline in functional ability/mobility    Preferred Language:English         Past Medical History:   Diagnosis Date    Chronic back pain     Congenital hydronephrosis     Gout     Headache     Hematuria     HTN (hypertension) 12/10/2021    Kidney disease     Kidney stones     Major depressive disorder, single episode     Polyarthropathy or polyarthritis of multiple sites     Retinal detachment     Urethral stricture       Past Surgical History:   Procedure Laterality Date    CT INSERTION OF SUPRAPUBIC CATH  09/25/2015    NEPHRECTOMY Right 1955    APPENDECTOMY      COLONOSCOPY      CYSTOSCOPY      CYSTOSCOPY W/ LASER LITHOTRIPSY      OTHER SURGICAL HISTORY      Interstim 01/29/2011    SPINE SURGERY  09/21    Lumbar-sacral fusion    TRANSURETHRAL RESECTION OF PROSTATE          OT Acute       Row Name 11/30/23 1500          Type of Visit    Type of Occupational Therapy note Occupational Therapy Evaluation       Row Name 11/30/23 1500          Treatment Time    Treatment Start Time 0925     Total TIMED Treatment (min) --  25     Total Treatment Time (min) 40       Row Name 11/30/23 1500          Treatment Precautions/Restrictions    Precautions/Restrictions Fall     Fall Socks/charm       Row Name 11/30/23 1500          Medical History    History of presenting condition Pt is a 72 y/o M "with metastatic urothelial carcinoma, hypertension, depression, gout, presented to the hospital due to  shaking and weakness."     Fall history Discussed fall prevention education with patient       Row Name 11/30/23 1500          Functional History    Prior Level of Function Minimal deficits     General ADL/Self-Care Assistance Needs Independent with ADLs and self care using adaptive device/equipment     Self Care Equipment/Device(s) in the home Shower chair/bench     Equipment required for mobility in the home Walker     Other Functional History Information Pt reports he was independent for ADLs prior       Row Name 60/45/40 1500          Social History    Living Situation Lives  with family     Home Environment House     Home accessibility Performs activities of daily living (ADL's) on one level     Bathroom accessibility Walk in shower;Shower/tub seat     Other Social History Information Pt lives with his sister, who he states works from home and can assist as needed       Row Name 11/30/23 1500          Subjective    Patient status Patient agreeable to treatment;Nursing in agreement for treatment;Patient pain control adequate to participate in therapy       Row Name 11/30/23 1500          Pain Assessment    Pain Asssessment Tool Numeric Pain Rating Scale     Other Pain Assessment Information Pt pre-medicated for pain       Row Name 11/30/23 1500          Numeric Pain Rating Scale    Pain Intensity - rating at present 10     Pain Intensity- rating after treatment 10     Location Back       Row Name 11/30/23 1500          Activities of Daily Living (ADLs)    Self Feeding Independent     Self Grooming Supervised     Other Self Grooming Information Seated     Upper Body Dressing Supervised     Lower Body Dressing Moderate assistance (25-50% assistance)     Bathing Moderate assistance (25-50% assistance)     Toileting Minimum assistance (25% assistance)     Toilet Transfers Minimum assistance (25% assistance)     Other Toilet Transfers Information Simulated       Row Name 11/30/23 1500          [place] AM-PAC: Daily  Activity    Assistance Needed to Put on and Take off Regular Lower Body Clothing 2     Assistance Needed to Bathe, Including Washing, Rinsing, and Drying 2     Assistance Needed to Toilet Agricultural consultant, Bedpan, or Urinal) 3     Assistance Needed to Put on and Take off Regular Upper Body Clothing 2     Assistance Needed to Take Care of Personal Grooming Such as Brushing Teeth 3     Assistance Needed to Eat Meals 4     AM-PAC Daily Activity Total Score 16     AMP-PAC Daily Activity Impairment rating Score 14-19 - 40-59% impaired       Row Name 11/30/23 1500          Objective    Overall Cognitive Status Intact - no cognitive limitations or impairments noted     Communication No communication limitations or impairments noted. Current status of hearing, speech and vision allow functional communication.     Coordination/Motor control Other (comment)     Coordination/Motor Control  Information BUE tremoring     Balance Balance limitations present     Static Sitting Balance Good - able to maintain balance without handhold support, limited postural sway     Dynamic Sitting Balance Fair - accepts minimal challenge, able to maintain balance while turning head/trunk     Static Standing Balance Fair - able to maintain balance with handhold support, may require occasional minimal assistance     Dynamic Standing Balance Fair - accepts minimal challenge, able to maintain balance while turning head/trunk     Extremity Assessment Range of motion, strength,  muscle tone and/or  sensation limitations present     LUE findings Grossly 4/5     RUE findings Grossly 4/5, 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  Pt participated in functional activities as described above, declined further activity due to fatigue and increased back pain after sitting EOB to eat breakfast. Pt hypotensive, RN aware. Pt was left semi-supine in bed with all needs met and care handed off to RN.                        OT Acute Tool Box       Row Name 11/30/23 1500          Cognition Assessment    Overall Cognitive Status Intact - no cognitive limitations or impairments noted                        Eval cont.       Row Name 11/30/23 1500          Patient/Family Education    Learner(s) Patient     Learner response to rehab patient education interventions Verbalizes understanding;Able to return demonstrate teaching;Needs reinforcement     Patient/family training comments OT goals, POC, safety       Row Name 11/30/23 1500          Assessment    Assessment Pt seen for OT evaluation, presenting below baseline level of function at overall min level of assistance for ADLs and functional mobility. Pt is limited by decreased activity tolerance, generalized weakness, impaired balance/postural control, and pain. Pt will benefit from continued IPOT services to address impairments/limitations impacting pt's safety and independence in ADLs and functional mobility.     POST ACUTE REHABILITATION DISCHARGE RECOMMENDATIONS:    Recommended Post-Acute Therapy Follow up:   Home Health    If available, recommend discharge to a setting with the following assistance for the listed deficits:     Deficits :   All mobility related ADLs assist level: Minimum (25% or less assistance)      Other Post Acute Discharge Recommendations : Pending medical recommendations for hypotension    Equipment recommendations:   Bedside Commode  Shower chair      Rehab Potential Good       Row Name 11/30/23 1500          Patient stated Goal    Patient stated goal To get stronger       Row Name 11/30/23 1500          Goal  1 (Short Term)    Impairment Activity tolerance     Activity tolerance Patient able to stand at sink for 10 minutes in preparation for participation in ADL's     Custom goal Supervision     Number of visits 5     Goal Status New       Row Name 11/30/23 1500          Goal 2 (Short Term)  Impairment Functional ability/mobility - Toileting     Custom goal Pt will perform all aspects of toileting with supervision     Number of visits 5     Goal Status New       Row Name 11/30/23 1500          Planned Therapy Interventions and Rationale    Patient Education to improve energy conservation measures during functional activities;to increase safety during dynamic activities;to increase knowledge of precautions to prevent/minimize complications of condition;to increase independence in functional activities     Self-Care/ADL Training to improve independence with adaptive equipment;to improve independence with compensatory strategies;to improve energy conservation measures during functional activities;to improve home safety;to improve safety when completing daily activities and self care;to improve safety while using an assistive device     Therapeutic Activities to improve ability to perform self care and ADL's;to improve transfers between surfaces;to restore functional performace using graded activities     Therapeutic Exercise to improve activity tolerance to allow greater independence with self care and ADL's;to increase strength to allow greater independence with self care and ADL's       Row Name 11/30/23 1500          Treatment Plan Disussion    Treatment Plan Discussion and Agreement Patient/family/caregiver stated understanding and agreement with the therapy plan;Patient support system determined and all questions were asked and answered       Row Name 11/30/23 1500          Treatment Plan    Continue therapy to address Activity tolerance limitation;Decline in performance of activities of daily living (ADL);Decline in  functional ability/mobility     Frequency of treatment 4 times per week     Duration of treatment (number of visits) Treatment will continue while in hospital and in need of skilled therapy services     Status of treatment Patient evaluated and will benefit from ongoing skilled therapy       Row Name 11/30/23 1500          Patient Safety Considerations     Patient safety considerations Call light left in reach and fall precautions in place;Patient left  in appropriate pressure relieving position;Patient may be at risk for falls;Nursing notified of safety considerations at end of treatment;Patient returned to bed at end of treatment     Patient assistive device requirements for safe ambulation Lupita Shutter Name 11/30/23 1500          Therapy Plan Communication    Therapy Plan Communication Discussed therapy plan with Nursing and/or Physician       Row Name 11/30/23 1500          Occupational Therapy Patient Discharge Instructions    Your Occupational Therapist suggests the following Continue to follow your prescribed mobility precautions when transferring to the chair and toilet as instructed;Continue to complete your self care Activities of Daily Living as frequently as possible;Continue to use energy conservation, pursed lip breathing and self-pacing when completing your self care Activities of Daily Living;Supervision is suggested when you     Supervision is suggested when you bath;dress;toilet       Row Name 11/30/23 1500          Type of Eval    Low Complexity [phone]) Completed       Row Name 11/30/23 1500          Therapeutic Procedures    Therapeutic Activities ([id number]) Assistance/facilitation of bed mobility;Facilitation of  safety awareness/responses during functional tasks;Dynamic activities to improve performance of functional tasks/activities;Graded physical assist for functional activities;Functional activities;Grasp/release activities;Gross motor control activities;Patient education;Progressive  mobilization to improve functional independence;Transfer training with weight shift and direction change         Total TIMED Treatment (min) 25                     The occupational therapist of record is endorsed by evaluating occupational therapist.

## 2023-11-30 NOTE — ED Notes (Signed)
Patient HR dips into 40s/30s, as low as 38. Asymptomatic. MD made aware

## 2023-11-30 NOTE — Plan of Care (Signed)
Problem: Promotion of Health and Safety  Goal: Promotion of Health and Safety  Description: The patient remains safe, receives appropriate treatment and achieves optimal outcomes (physically, psychosocially, and spiritually) within the limitations of the disease process by discharge.    Information below is the current care plan.  Outcome: Progressing  Flowsheets (Taken 11/30/2023 0636)  Patient /Family stated Goal: Discharge home  Guidelines: Inpatient Nursing Guidelines  Individualized Interventions/Recommendations #1: Monitor VS especially HR and BP  Individualized Interventions/Recommendations #2 (if applicable): Promote comfort and rest  Individualized Interventions/Recommendations #3 (if applicable): Cluster care to promote rest  Outcome Evaluation (rationale for progressing/not progressing) every shift: Pt A&Ox4.  Pt seen to be asymptomatic bradycardic while sleeping.  Other VSS.  Pain relief provided to patient and offered for stool softeners due to large fecal impaction - disempaction pending for today 11/30/23.  Newly replaced nephrostomy tube from 12/11 in IR providing proper urine output and no S/Sx of bleeding at insertion site.  Note:

## 2023-11-30 NOTE — Plan of Care (Signed)
Problem: Promotion of Health and Safety  Goal: Promotion of Health and Safety  Description: The patient remains safe, receives appropriate treatment and achieves optimal outcomes (physically, psychosocially, and spiritually) within the limitations of the disease process by discharge.    Information below is the current care plan.  Outcome: Progressing  Flowsheets  Taken 11/30/2023 1707 by Donnetta Simpers, RN  Patient /Family stated Goal: to poop  Individualized Interventions/Recommendations #4 (if applicable): administer golytely to promote BM  Outcome Evaluation (rationale for progressing/not progressing) every shift: pt a+o x4. becomes bradycardic to high 30s. MD made aware on numerous occasions, acknowledged, no new orders. Hypotensive 90s/40s. MD said as long as BP doesnt sustain in 80s, its ok. Afebrile. Room air, sat high 90s. Worked with PT this shift. Turns self in bed. No skin issues noted. Continue port  Taken 11/30/2023 0636 by Demetrius Revel, RN  Guidelines: Inpatient Nursing Guidelines  Individualized Interventions/Recommendations #1: Monitor VS especially HR and BP  Individualized Interventions/Recommendations #2 (if applicable): Promote comfort and rest  Individualized Interventions/Recommendations #3 (if applicable): Cluster care to promote rest  Note:

## 2023-11-30 NOTE — Progress Notes (Signed)
MEDICINE PROGRESS NOTE    Hospital day:   2 days - Admitted on: 11/28/2023    Subjective / Interval Events     26M h/o metastatic urothelial carcinoma, HTN, depression, gout, and insomnia here for feeling septic, found to have retraction of L PCN w left hydronephrosis w superimposed stranding, c/f urosepsis.    Doing ok, feels like his shakiness is still there, mainly in legs. Concerned that he can't walk. Ok to try golyteley to dissolve stool ball.    Objective        Last 24 hours  Temperature:  [97.4 F (36.3 C)-99.3 F (37.4 C)] 98.3 F (36.8 C) (12/12 1200)  Blood pressure (BP): (81-124)/(44-69) 109/55 (12/12 1400)  Heart Rate:  [40-67] 45 (12/12 1400)  Respirations:  [11-24] 13 (12/12 1400)  Pain Score: 4 (12/12 0755)  O2 Device: None (Room air);Nasal cannula (12/12 1200)  O2 Flow Rate (L/min):  [2 l/min] 2 l/min (12/12 1200)  SpO2:  [91 %-98 %] 98 % (12/12 1400)  Urine x0 Stool x0 Emesis x0   12/11 0600 - 12/12 0559  In: 1740 [P.O.:740; I.V.:1000]  Out: 3550 [Urine:3550]    Gen: laying in bed, NAD  HEENT: NCAT  CV: bradycardic  RESP: CTAB anteriorly, no increased WOB  ABD: soft, urine in L PCN bag  Ext: able to text without tremor, no resting tremor observed on LE; peeling skin on hands     Pertinent Labs & Imaging     WBC 7.2 (12/12) HGB 8.3* (12/12) PLT 111* (12/12)    HCT 25.4* (12/12)      %Neutrophils 72.8 (12/12) %Bands   %Lymphs 15.6 (12/12) %Monos 7.1 (12/12) %Eos 3.8 (12/12) %Basos 0.4 (12/12)  Na 139 (12/12) CL 104 (12/12) BUN 23 (12/12) GLU   91 (12/12)   K 3.9 (12/12) CO2 29 (12/12) Cr 1.45* (12/12)      [place] 8.8 (12/12) Mg 1.6 (12/12) Phos 3.5 (12/12)  TP 5.2* (12/11) ALT 22 (12/11) TBILI 1.36* (12/11) ALK PHOS  93 (12/11)   ALB 2.7* (12/11) AST 20 (12/11) DBILI          CTAP  IMPRESSION:  Interval retraction of the left nephrostomy tube, now terminating in the inferior pole cortex. This contributes to new moderate left hydronephrosis with superimposed perinephric/periureteral stranding,  nonspecific but may represent inflammation/infection. Recommend consultation with interventional radiology for consideration of repositioning     Consolidative left basilar opacities, likely infectious/inflammatory..  Large rectal stool ball without definite evidence of stercoral colitis. Consider disimpaction.  Stable osseous and retroperitoneum metastatic disease.  Note that assessment of solid organs is limited in the absence of intravenous contrast.    Assessment / Plan   26M h/o metastatic urothelial carcinoma, HTN, depression, gout, and insomnia here for feeling septic, found to have retraction of L PCN w left hydronephrosis w superimposed stranding, c/f urosepsis.    # Sepsis 2/2 UTI  - CTAP with moderate left hydronephrosis with superimposed perinephric/periureteral stranding  - UA + with >100k E Coli, sensitivities pending  - pt non-febrile but presenting w same constellation of weakness and shaking as prior sepsis  - S/p Linezolid 12/11, Zosyn (12/11-)  - Pantoea, E Faecalis, Pseudomonas prior, zosyn-susceptible  PLAN  - f/u UA sensitivities  - Continue Zosyn (12/11-)  - PT/OT for unsteadiness (usually associated with infection per pt)    #Large Stool Ball  - was having diarrhea at home so stopped Lactulose  - wasn't taking miralax/senna  - stool ball appears  close to rectum, will attempt to dislodge with enemas  - s/p mineral oil enema 12/11 wo bowel movement  - discussed further enemas with BID miralax v trialing golytely prior to attempting disimpaction, pt opted for golyteley    #AKI on CKD, resolved  #Moderate L Hydronephrosis   - s/p 12/11 PCN repositioning, draining well  - BL Cr ~1.2-1.3, 1.74 on admission  - S/p 3.5L fluid resuscitation in ED; suspected pre-renal with possible intrinsic damage 2/2 hydro  - 1.45 12/12 ~around baseline    #Metastatic Urothelial Carcinoma  - f/w Dr. Roseanne Reno opt, continuing holding home Erdafitinib 8mg    - continue Acetaminophen 500mg  PO q8h prn mild pain  - continue  Hydromorphone 2mg  1-2 tablets (2mg  to 4mg ) PO q4h PRN moderate- severe pain  - continue Tizanidine 2mg  PO q8h PRN muscle spasms  - continue Pregabalin 100mg  PO BID     Generalized anxiety disorder  - Continue Cymbalta 60 mg p.o. BID  - continue Buspar 20 mg p.o. q12hrs   - Continue Trazodone 50mg  nightly PRN insomnia    #Gout: home Allopurinol   #Allergies: home Flonase    Level of Care: IMU     Disposition Planning 11/30/2023:   Days to Complete Medical Plan of Care Requiring Ongoing Hospitalization: 2 days out --->Care Needs: Clinical Improvement/Symptom Control: Sepsis                 (Discharge Planning and EDD Info)   Remaining Needs: None    Discharge location: Home   Outpatient Referrals Needed: none      Access:   Port A Cath Single Lumen  - 02/13/23 Right Internal jugular (Active)       Peripheral IV - 20 G Right;Upper  Forearm (Active)       Nephrostomy -  Left (Active)       Nephrostomy -  Left (Active)       Nephrostomy -  Left (Active)       Stent -  Ureteral (left) (Active)         Foley:  PCN  Nephrostomy -  Left (Active)       Nephrostomy -  Left (Active)       Nephrostomy -  Left (Active)     VTE prophylaxis: SQ heparin [Manual MAR Hold] heparin-5,000 Units   IV fluids:       CODE status: Full Code  Last BM:    , , ,   Diet: Diet Regular  Nutritional Supplement - Oral Boost Max/Premier Protein; Boost Max - Toni Arthurs; Deliver Supplements: TID        Ulyses Jarred MD PGY3 IM

## 2023-12-01 ENCOUNTER — Telehealth (INDEPENDENT_AMBULATORY_CARE_PROVIDER_SITE_OTHER): Payer: Medicare Other | Admitting: Nephrology

## 2023-12-01 LAB — BASIC METABOLIC PANEL, BLOOD
Anion Gap: 9 mmol/L (ref 7–15)
BUN: 24 mg/dL — ABNORMAL HIGH (ref 8–23)
Bicarbonate: 29 mmol/L (ref 22–29)
Calcium: 9.2 mg/dL (ref 8.5–10.6)
Chloride: 105 mmol/L (ref 98–107)
Creatinine: 1.46 mg/dL — ABNORMAL HIGH (ref 0.67–1.17)
Glucose: 86 mg/dL (ref 70–99)
Potassium: 3.5 mmol/L (ref 3.5–5.1)
Sodium: 143 mmol/L (ref 136–145)
eGFR Based on CKD-EPI 2021 Equation: 51 mL/min/{1.73_m2}

## 2023-12-01 MED ORDER — SODIUM CHLORIDE 0.9 % IV SOLN
1000.0000 mg | Freq: Four times a day (QID) | INTRAVENOUS | Status: DC
Start: 2023-12-01 — End: 2023-12-04
  Administered 2023-12-01 – 2023-12-04 (×12): 1000 mg via INTRAVENOUS
  Filled 2023-12-01 (×12): qty 1000

## 2023-12-01 MED ORDER — ONDANSETRON HCL 4 MG/2ML IV SOLN
4.0000 mg | Freq: Four times a day (QID) | INTRAMUSCULAR | Status: DC | PRN
Start: 2023-12-01 — End: 2023-12-04

## 2023-12-01 MED ORDER — AMPICILLIN 500 MG OR CAPS
500.0000 mg | ORAL_CAPSULE | Freq: Four times a day (QID) | ORAL | Status: DC
Start: 2023-12-01 — End: 2023-12-01
  Administered 2023-12-01: 13:00:00 500 mg via ORAL
  Filled 2023-12-01 (×3): qty 1

## 2023-12-01 MED ORDER — CIPROFLOXACIN HCL 500 MG OR TABS
250.0000 mg | ORAL_TABLET | Freq: Two times a day (BID) | ORAL | Status: DC
Start: 2023-12-01 — End: 2023-12-01

## 2023-12-01 NOTE — Progress Notes (Signed)
MEDICINE PROGRESS NOTE    Hospital day:   3 days - Admitted on: 11/28/2023    Subjective / Interval Events     74M h/o metastatic urothelial carcinoma, HTN, depression, gout, and insomnia here for feeling septic, found to have retraction of L PCN w left hydronephrosis w superimposed stranding, c/f urosepsis.    Doing ok, still feels concerned about going home d/t unsteadiness/weak legs. Thinks his leg will give out and he will not be able to get up. Open to disimpaction today.    Objective        Last 24 hours  Temperature:  [97 F (36.1 C)-98.5 F (36.9 C)] 98 F (36.7 C) (12/13 1705)  Blood pressure (BP): (100-152)/(56-85) 108/66 (12/13 1705)  Heart Rate:  [38-74] 61 (12/13 1705)  Respirations:  [11-22] 16 (12/13 1705)  Pain Score: 4 (12/13 1341)  O2 Device: None (Room air) (12/13 1705)  O2 Flow Rate (L/min):  [2 l/min] 2 l/min (12/13 1000)  SpO2:  [94 %-99 %] 98 % (12/13 1705)  Urine x0 Stool x1 Emesis x0   12/12 0600 - 12/13 0559  In: -   Out: 1650 [Urine:1650]    Gen: laying in bed, NAD  HEENT: NCAT  CV: bradycardic  RESP: no increased WOB  ABD: soft, yellow urine in L PCN bag  Ext: able to lift both legs against gravity and hold; peeling skin on hands     Pertinent Labs & Imaging     WBC 7.2 (12/12) HGB 8.3* (12/12) PLT 111* (12/12)    HCT 25.4* (12/12)      %Neutrophils 72.8 (12/12) %Bands   %Lymphs 15.6 (12/12) %Monos 7.1 (12/12) %Eos 3.8 (12/12) %Basos 0.4 (12/12)  Na 143 (12/13) CL 105 (12/13) BUN 24* (12/13) GLU   86 (12/13)   K 3.5 (12/13) CO2 29 (12/13) Cr 1.46* (12/13)      [place] 9.2 (12/13) Mg 1.6 (12/12) Phos 3.5 (12/12)  TP 5.2* (12/11) ALT 22 (12/11) TBILI 1.36* (12/11) ALK PHOS  93 (12/11)   ALB 2.7* (12/11) AST 20 (12/11) DBILI          CTAP  IMPRESSION:  Interval retraction of the left nephrostomy tube, now terminating in the inferior pole cortex. This contributes to new moderate left hydronephrosis with superimposed perinephric/periureteral stranding, nonspecific but may represent  inflammation/infection. Recommend consultation with interventional radiology for consideration of repositioning     Consolidative left basilar opacities, likely infectious/inflammatory..  Large rectal stool ball without definite evidence of stercoral colitis. Consider disimpaction.  Stable osseous and retroperitoneum metastatic disease.  Note that assessment of solid organs is limited in the absence of intravenous contrast.    Assessment / Plan   74M h/o metastatic urothelial carcinoma, HTN, depression, gout, and insomnia here for feeling septic, found to have retraction of L PCN w left hydronephrosis w superimposed stranding, c/f urosepsis.    # Sepsis 2/2 UTI  - CTAP with moderate left hydronephrosis with superimposed perinephric/periureteral stranding  - UA + with >100k E Coli, sensitivities pending  - pt non-febrile but presenting w same constellation of weakness and shaking as prior sepsis  - S/p Linezolid 12/11, Zosyn (12/11-12/12)  - E Faecalis pan-susceptible  PLAN  - Start Ampicillin 500mg  QID (12/13-12/17), will transition to Augmentin on DC - plan for total 7 day course  - PT re-consulted for continued unsteadiness -- Dr. Roseanne Reno also requests continued PT for safest dispo home    #Large Stool Ball  - was having diarrhea at  home so stopped Lactulose; wasn't taking miralax/senna  - stool ball appears close to rectum, will attempt to dislodge with enemas  - s/p mineral oil enema 12/11 wo bowel movement  - Pt not tolerating golyteley, only one BM 12/12, disimpacted as much as possible 12/13  PLAN  - pt to drink more of his golyteley today s/p disimpaction, if not stooling still, can attempt to disimpact further if he were able to mobilize some stool    #AKI on CKD, resolved  #Moderate L Hydronephrosis   - s/p 12/11 PCN repositioning, draining well  - BL Cr ~1.2-1.3, 1.74 on admission  - S/p 3.5L fluid resuscitation in ED; suspected pre-renal with possible intrinsic damage 2/2 hydro  - 1.45 12/12 ~around  baseline    #Metastatic Urothelial Carcinoma  - f/w Dr. Roseanne Reno opt, continuing holding home Erdafitinib 8mg    - continue Acetaminophen 500mg  PO q8h prn mild pain  - continue Hydromorphone 2mg  1-2 tablets (2mg  to 4mg ) PO q4h PRN moderate- severe pain  - continue Tizanidine 2mg  PO q8h PRN muscle spasms  - continue Pregabalin 100mg  PO BID     Generalized anxiety disorder  - Continue Cymbalta 60 mg p.o. BID  - continue Buspar 20 mg p.o. q12hrs   - Continue Trazodone 50mg  nightly PRN insomnia    #Gout: home Allopurinol   #Allergies: home Flonase    Level of Care: IMU     Disposition Planning 12/01/2023:   Days to Complete Medical Plan of Care Requiring Ongoing Hospitalization: 2 days out --->Care Needs: Clinical Improvement/Symptom Control: Sepsis                 (Discharge Planning and EDD Info)   Remaining Needs: None    Discharge location: Home   Outpatient Referrals Needed: none      Access:   Port A Cath Single Lumen  - 02/13/23 Right Internal jugular (Active)       Peripheral IV - 20 G Right;Upper  Forearm (Active)       Nephrostomy -  Left (Active)       Nephrostomy -  Left (Active)       Nephrostomy -  Left (Active)       Stent -  Ureteral (left) (Active)         Foley:  PCN  Nephrostomy -  Left (Active)       Nephrostomy -  Left (Active)       Nephrostomy -  Left (Active)     VTE prophylaxis: SQ heparin heparin-5,000 Units   IV fluids:       CODE status: Full Code  Last BM: 12/01/23 1341   , ,Small,   Diet: Diet Regular  Nutritional Supplement - Oral Boost Max/Premier Protein; Boost Max - Toni Arthurs; Deliver Supplements: TID        Stephen Jarred MD PGY3 IM

## 2023-12-01 NOTE — Plan of Care (Signed)
Problem: Promotion of Health and Safety  Goal: Promotion of Health and Safety  Description: The patient remains safe, receives appropriate treatment and achieves optimal outcomes (physically, psychosocially, and spiritually) within the limitations of the disease process by discharge.    Information below is the current care plan.  12/01/2023 0441 by Demetrius Revel, RN  Outcome: Progressing  Flowsheets (Taken 12/01/2023 0441)  Patient /Family stated Goal: Have bowel movement  Guidelines: Inpatient Nursing Guidelines  Individualized Interventions/Recommendations #1: Monitor VS  Individualized Interventions/Recommendations #2 (if applicable): Monitor HR and BP while sleeping  Individualized Interventions/Recommendations #3 (if applicable): Assess L nephrostomy site and ensure patency  Individualized Interventions/Recommendations #4 (if applicable): Encourage patient to report when feels need to have BM  Outcome Evaluation (rationale for progressing/not progressing) every shift: Pt A&Ox4.  While sleeping patient noted to be asymptomatic bradycardic.  VSS otherwise.  Pt understandingly annoyed with bowel movement status and noting cramping and feeling a lot of pressure due to large fecal mass found in colon.  Pt was noted to have a smear of BM, but none while placed on commode.  Plan is for disimpaction today (12/01/2023) if produced no BM overnight.  Rested for most of night and notified when pain meds were needed.  Note:          12/01/2023 0435 by Demetrius Revel, RN  Outcome: Progressing  Note:

## 2023-12-01 NOTE — Consults (Signed)
ONCOLOGY CONSULT NOTE    Reason for Consult: metastatic UC  Physician Requesting Consult: Dr. Danella Penton    Interval History:  72 yo M with metastatic UC, most recently on erdafitinib with improvement of his disease, however, which was stopped 2/2 side effects (hand-foot-syndrome), here with urosepsis in the setting of dislodged L PCN.  Care complicated by stool ball for which he is undergoing disimpaction.  I am seeing this patient to address side effects of erdafitinib and ensure appropriate care for his metastatic urothelial carcinoma.     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 Multiple osseous lesions involving the bilateral iliac crest and right acetabulum concerning for osseous metastatic disease.   11/26/22  PET/CT: diffuse osseous FDG activity with discrete osseous lesions concerning for metastatic disease.  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  Also treated for septic shock, AKI (Cr 2.5) and acute liver  injury (AST/ALT 2800/2100).   S/p exchange L PCN.   02/07/23  start XRT to R Shoulder  02/10/23  C2D1 EV (dose reduced to 1.0 mg/kg) + pembro 200 mg IV  03/03/23  C3D1 EV/pembro, missed D8  03/06/23  Cysto: NED.  Cytology NED.   03/14/23  Seen at [facility] ED for dislodged tube, replaced  04/02-05/24  Admitted with weakness, poor PO intake.   04/06/23  C4D1 EV/pembro  04/13/23  C4D8 EV  04/26/23  PET/CT: osseous lesions much improved, many new sclerotic lesions c/w treatment change.  Nonspecific mediastinal and pelvic LN  05/09-11/24  Admitted for treatment of AKI and rhabdomyolysis  05/11/23  C5D1 EV/P DR to 0.75  06/01/23  C6D1 EV/Pembro 0.75  06/23/23  MRI C-Spine: disease in spine, but no fracture or epidural involvement.  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 2. Although there has been interval improvement/resolution of previous multiple lesions in the sternum, spine and pelvis, there are multiple new lesions in L4, right posterior L2, and left lateral 4th and 7th ribs.    Review of Systems:  10/14 systems  reviewed and otherwise negative except for aforementioned symptoms.     PMHx/Surg Hx/Fam Hx/Allergies Reviewed in Epic    Current Inpatient Meds Reviewed    Physical Exam:   12/01/23  0754 12/01/23  1000 12/01/23  1122 12/01/23  1341   BP: 114/64 107/85  143/83   Pulse: 61 68 (!) 38 50   Temp: 97.3 F (36.3 C) 97 F (36.1 C)  98.5 F (36.9 C)   Resp: 13 16  16    SpO2: 99% 99%  97%     He is lying in bed  Hands with desquamative changes on skin in multiple areas, skin pealing on fingers  Feet are non-tender to palpation, no  significant skin changes on bottom of feet.  Lying in bed  NAD  Abd soft  RRR no mrg  CTAB no wrc  No edema  Psychologic: Appropriate mood and affect.    LABS  Lab Results   Component Value Date    NA 143 12/01/2023    K 3.5 12/01/2023    CL 105 12/01/2023    BICARB 29 12/01/2023    BUN 24 (H) 12/01/2023    CREAT 1.46 (H) 12/01/2023    GLU 86 12/01/2023    [place] 9.2 12/01/2023     Lab Results   Component Value Date    WBC 7.2 11/30/2023    HGB 8.3 (L) 11/30/2023    HCT 25.4 (L) 11/30/2023    PLT 111 (L) 11/30/2023    SEG 72.8 11/30/2023    LYMPHS 15.6 11/30/2023    MONOS 7.1 11/30/2023    EOS 3.8 11/30/2023     No results found for: "AST", "ALT", "ALK", "TBILI", "DBILI", "TP", "ALB"  No results found for: "INR", "PTT"    Assessment and Recommendations:  Stephen [name] is a 72 year old male with OA, HTN, MDD with L UTUC with metastatic disease ([id number]), with osseous mets (Pet/CT with diffuse enhancement and discrete lesions), most recently on erdafitinib with improvement in disease burden but 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   Now admitted for urosepsis in the setting of dislodged PCN and also with stool ball for which he is undergoing disimpaction.    He is s/p exchange of PCN and is currently on abx (cultures with GNR and enterococcus).    I personally reviewed his labs and imaging as above.    He is currently recovering from his infection, creatinine is improving.  He is getting disimpacted from a stool ball that has been causing him a lot of GI issues.    In terms of his metastatic UC, continue to hold erdafitinib.  His hands/feet are much better since holding this.  Next week we will likely restart but at a lower dose (6 mg daily).    He is anemic and we will continue to monitor this as an outpatient.  Goal hgb > 8.0.  Continue pain medications, he is known well to the palliative care service in case his pain is an issue in house.   Please continue PT while in house.  He strongly desires to go home at  discharge, and he needs to be able to walk to stay at home.  Previously his hand-foot syndrome was causing him a lot of pain and preventing this.    Udder cream/urea cream for hands if available.    This visit was spent addressing a complex, chronic illness that poses a threat to life or bodily function to this patient.  I independently reviewed the  patient's history, performed a comprehensive exam, reviewed labs and imaging, counseled the patient on the management of their chronic illness, and documented my recommendations.     Asa Saunas. Roseanne Reno, M.D.  Assistant Professor of Medicine   [employer], [facility]

## 2023-12-02 ENCOUNTER — Inpatient Hospital Stay (HOSPITAL_BASED_OUTPATIENT_CLINIC_OR_DEPARTMENT_OTHER): Payer: Medicare Other

## 2023-12-02 DIAGNOSIS — M4802 Spinal stenosis, cervical region: Secondary | ICD-10-CM

## 2023-12-02 DIAGNOSIS — M47812 Spondylosis without myelopathy or radiculopathy, cervical region: Secondary | ICD-10-CM

## 2023-12-02 DIAGNOSIS — M4804 Spinal stenosis, thoracic region: Secondary | ICD-10-CM

## 2023-12-02 DIAGNOSIS — Z981 Arthrodesis status: Secondary | ICD-10-CM

## 2023-12-02 LAB — CBC WITH DIFF, BLOOD
ANC-Automated: 1.8 10*3/uL (ref 1.6–7.0)
Abs Basophils: 0 10*3/uL (ref <0.2)
Abs Eosinophils: 0.1 10*3/uL (ref 0.0–0.5)
Abs Lymphs: 1.2 10*3/uL (ref 0.8–3.1)
Abs Monos: 0.4 10*3/uL (ref 0.2–0.8)
Basophils: 0.6 %
Eosinophils: 3.1 %
Hct: 26.8 % — ABNORMAL LOW (ref 40.0–50.0)
Hgb: 8.9 g/dL — ABNORMAL LOW (ref 13.7–17.5)
Imm Gran %: 0.6 % (ref <1)
Imm Gran Abs: 0 10*3/uL (ref <0.1)
Lymphocytes: 34.4 %
MCH: 30.6 pg (ref 26.0–32.0)
MCHC: 33.2 g/dL (ref 32.0–36.0)
MCV: 92.1 um3 (ref 79.0–95.0)
MPV: 10.3 fL (ref 9.4–12.4)
Monocytes: 9.9 %
Plt Count: 133 10*3/uL — ABNORMAL LOW (ref 140–370)
RBC: 2.91 10*6/uL — ABNORMAL LOW (ref 4.60–6.10)
RDW: 14.9 % — ABNORMAL HIGH (ref 12.0–14.0)
Segs: 51.4 %
WBC: 3.6 10*3/uL — ABNORMAL LOW (ref 4.0–10.0)

## 2023-12-02 LAB — BASIC METABOLIC PANEL, BLOOD
Anion Gap: 10 mmol/L (ref 7–15)
BUN: 22 mg/dL (ref 8–23)
Bicarbonate: 28 mmol/L (ref 22–29)
Calcium: 8.8 mg/dL (ref 8.5–10.6)
Chloride: 108 mmol/L — ABNORMAL HIGH (ref 98–107)
Creatinine: 1.31 mg/dL — ABNORMAL HIGH (ref 0.67–1.17)
Glucose: 89 mg/dL (ref 70–99)
Potassium: 3.5 mmol/L (ref 3.5–5.1)
Sodium: 146 mmol/L — ABNORMAL HIGH (ref 136–145)
eGFR Based on CKD-EPI 2021 Equation: 59 mL/min/{1.73_m2}

## 2023-12-02 MED ORDER — GADOBUTROL 1 MMOL/ML IV SOLN (WRAPPED RECORD)
7.0000 mL | Freq: Once | INTRAVENOUS | Status: AC
Start: 2023-12-02 — End: 2023-12-02
  Administered 2023-12-02: 7 mL via INTRAVENOUS
  Filled 2023-12-02: qty 7

## 2023-12-02 MED ORDER — POLYETHYLENE GLYCOL 3350 OR PACK
17.0000 g | PACK | Freq: Every day | ORAL | Status: DC
Start: 2023-12-03 — End: 2023-12-03

## 2023-12-02 MED ORDER — LORAZEPAM 1 MG OR TABS
1.0000 mg | ORAL_TABLET | Freq: Once | ORAL | Status: AC
Start: 2023-12-02 — End: 2023-12-02
  Administered 2023-12-02: 1 mg via ORAL
  Filled 2023-12-02: qty 1

## 2023-12-02 MED ORDER — SENNA 8.6 MG OR TABS
2.0000 | ORAL_TABLET | Freq: Every evening | ORAL | Status: DC
Start: 2023-12-03 — End: 2023-12-04

## 2023-12-02 NOTE — Interdisciplinary (Signed)
12/02/23 1300   Follow Up/Progress   Is the Patient Medically Stable for Discharge * No   CM Discharge Arrangements Complete No   Where was the patient admitted from? * Home   Barriers to Discharge * Clinical reason   Anticipated Discharge Disposition/Needs * HH PT   PATIENT CHOICE: Patient/Family/Legal/Surrogate Decision Maker Has Been Given a List Options And Choice In The Selection of Post-Acute Care Providers Yes   Patient's Top 3 Discharge Facility Choices  [facility]   Family/Caregiver's Assessed for * Readiness, willingness, and ability to provide or support self-management activities   Respite Care * Not Applicable   Patient/Family/Other Are In Agreement With Discharge Plan * Yes   Public Health Clearance Needed * Not Applicable   Anticipated DischargeTransportation *  Other (Comment)   Anticipated Discharge Transportation Details * TBD. Usually uses medical transport or cab.     Patient anticipated to be stable for discharge home tomorrow.  Tasked CMA to send referral to [facility] for HH-PT.

## 2023-12-02 NOTE — Plan of Care (Signed)
Problem: Promotion of Health and Safety  Goal: Promotion of Health and Safety  Description: The patient remains safe, receives appropriate treatment and achieves optimal outcomes (physically, psychosocially, and spiritually) within the limitations of the disease process by discharge.    Information below is the current care plan.  Outcome: Progressing  Flowsheets  Taken 12/02/2023 1037 by Ila Mcgill, RN  Individualized Interventions/Recommendations #2 (if applicable): Group care to promote patient rest  Individualized Interventions/Recommendations #5 (if applicable): Encourage mobility as tolerated  Outcome Evaluation (rationale for progressing/not progressing) every shift: Patient AOX4, no complaints of pain, VSS on RA, NSR on tele. MRI of spine completed this morning. Patient had incontinence of stool while getting oob for MRI. Working w/ PT. Pending transfer to med/surg floor when room available.  Taken 12/01/2023 0441 by Demetrius Revel, RN  Guidelines: Inpatient Nursing Guidelines  Individualized Interventions/Recommendations #1: Monitor VS  Individualized Interventions/Recommendations #3 (if applicable): Assess L nephrostomy site and ensure patency  Individualized Interventions/Recommendations #4 (if applicable): Encourage patient to report when feels need to have BM  Note:

## 2023-12-02 NOTE — Progress Notes (Signed)
MEDICINE PROGRESS NOTE    Hospital day:   4 days - Admitted on: 11/28/2023    Subjective / Interval Events     20M h/o metastatic urothelial carcinoma, HTN, depression, gout, and insomnia here for feeling septic, found to have retraction of L PCN w left hydronephrosis w superimposed stranding, c/f urosepsis.    Doing ok, still feels concerned about going home d/t unsteadiness/weak legs. Thinks his leg will give out and he will not be able to get up. Open to disimpaction today.    Objective        Last 24 hours  Temperature:  [97 F (36.1 C)-98.6 F (37 C)] 97 F (36.1 C) (12/14 1200)  Blood pressure (BP): (94-140)/(48-78) 140/68 (12/14 1200)  Heart Rate:  [44-61] 59 (12/14 1200)  Respirations:  [10-20] 20 (12/14 1200)  Pain Score: 0 (12/14 1200)  O2 Device: None (Room air) (12/14 1200)  SpO2:  [90 %-99 %] 98 % (12/14 1200)  Urine x0 Stool x2 Emesis x0   12/13 0600 - 12/14 0559  In: 800 [P.O.:800]  Out: 1900 [Urine:1900]    Gen: laying in bed, NAD  HEENT: NCAT  CV: bradycardic  RESP: no increased WOB  ABD: soft, yellow urine in L PCN bag  Ext: able to lift both legs against gravity and hold; peeling skin on hands     Pertinent Labs & Imaging     WBC 3.6* (12/14) HGB 8.9* (12/14) PLT 133* (12/14)    HCT 26.8* (12/14)      %Neutrophils 51.4 (12/14) %Bands   %Lymphs 34.4 (12/14) %Monos 9.9 (12/14) %Eos 3.1 (12/14) %Basos 0.6 (12/14)  Na 146* (12/14) CL 108* (12/14) BUN 22 (12/14) GLU   89 (12/14)   K 3.5 (12/14) CO2 28 (12/14) Cr 1.31* (12/14)      Esmond 8.8 (12/14) Mg 1.6 (12/12) Phos 3.5 (12/12)  TP   ALT   TBILI   ALK PHOS      ALB   AST   DBILI          CTAP  IMPRESSION:  Interval retraction of the left nephrostomy tube, now terminating in the inferior pole cortex. This contributes to new moderate left hydronephrosis with superimposed perinephric/periureteral stranding, nonspecific but may represent inflammation/infection. Recommend consultation with interventional radiology for consideration of repositioning      Consolidative left basilar opacities, likely infectious/inflammatory..  Large rectal stool ball without definite evidence of stercoral colitis. Consider disimpaction.  Stable osseous and retroperitoneum metastatic disease.  Note that assessment of solid organs is limited in the absence of intravenous contrast.    MRI Spine Survey  IMPRESSION:  Patient motion and lumbar hardware susceptibility artifact degrades assessment. Within these limitations:     Redemonstrated postsurgical changes related to L2-S1 posterior instrumented fusion, posterior L2-L5 decompression, and L5-S1 anterior fixation. Interbody cages are again seen from L2-L3 through L5-S1. Stable size of a 6.2 x 2.4 cm (SI by AP) collection within the L3/L4 posterior surgical bed. Please note sterility of the collection cannot be determined by MRI.     Interval increase diffuse osseous metastatic disease within the visualized bones, including the spine, partially imaged ribs and sacrum. No new pathologic fractures identified. Previously noted epidural extension within the midthoracic spine is difficult to delineate from vascular enhancement, though this may be increased within the midthoracic neural foramina. No convincing epidural extension into the bony spinal canal, though again this is difficult to definitively evaluate due to venous epidural enhancement (particularly dorsally at T4).  Minimal enhancement along the cauda equina nerve roots at the L5-S1 levels on axial imaging is favored to be vascular, though a leptomeningeal processes not completely excluded with certainty.     Otherwise, no convincing suspicious intrathecal, leptomeningeal, intramedullary, or epidural enhancement at evaluable levels, noting poor assessment at postsurgical levels due to susceptibility artifact from spinal hardware.     Of note, no specific findings of osteomyelitis-discitis or epidural abscess. However, there is increased marrow edema due to progressive osseous  metastasis, which makes superimposed infection difficult to exclude.     Redemonstrated degenerative findings with moderate canal stenosis at C4-C5 and C5-C6. Otherwise, no more than mild canal stenosis. No cord or cauda equina nerve root compression.     Evaluation of the neural foramina of the lumbar spine is limited due to susceptibility artifact from hardware, however there is no convincing high-grade foraminal stenosis of the lumbar spine. Probable moderate foraminal stenosis on the right at L1-L2 and possibly bilaterally at L4-L5, poorly characterized. Severe foraminal stenosis on the right at T2-T3 and possibly on the right at C7-T1, poorly characterized. Moderate-to-severe foraminal stenosis on the right at T1-T2 and T10-T11. Other scattered levels of up-to-moderate thoracic foraminal stenosis.    Assessment / Plan   31M h/o metastatic urothelial carcinoma, HTN, depression, gout, and insomnia here for feeling septic, found to have retraction of L PCN w left hydronephrosis w superimposed stranding, c/f urosepsis.    # Sepsis 2/2 UTI  - CTAP with moderate left hydronephrosis with superimposed perinephric/periureteral stranding  - UA + with >100k E Coli, sensitivities pending  - pt non-febrile but presenting w same constellation of weakness and shaking as prior sepsis  - S/p Linezolid 12/11, Zosyn (12/11-12/12)  - E Faecalis pan-susceptible  PLAN  - Start Ampicillin 500mg  QID (12/13-12/17), will transition to Augmentin on DC - plan for total 7 day course    #Imbalance, poa, improving  - Likely 2/2 neuropathic effects on chemo (currently on hold)  - Pt tremor significantly improved s/p antibiotics  - Given progressive weakness, stool balls, MRI spine survey completed  - MRI showed progression of known metastatic disease, no cord or cauda equina nerve root compression.  - PT re-consulted for continued unsteadiness -- rec HH PT    #Large Stool Ball  #Constipation  - was having diarrhea at home so stopped  Lactulose; wasn't taking miralax/senna  - stool ball appears close to rectum, will attempt to dislodge with enemas  - s/p mineral oil enema 12/11 wo bowel movement  - Pt not tolerating golyteley, only one BM 12/12, disimpacted as much as possible 12/13  - Stooling multiple times 12/12, 12/13  PLAN  - Transitioned to daily Miralax/Senna 12/15    #AKI on CKD, resolved  #Moderate L Hydronephrosis   - s/p 12/11 PCN repositioning, draining well  - BL Cr ~1.2-1.3, 1.74 on admission  - S/p 3.5L fluid resuscitation in ED; suspected pre-renal with possible intrinsic damage 2/2 hydro  - 1.45 12/12 ~around baseline    #Metastatic Urothelial Carcinoma  - f/w Dr. Roseanne Reno opt, continuing holding home Erdafitinib 8mg    - continue Acetaminophen 500mg  PO q8h prn mild pain  - continue Hydromorphone 2mg  1-2 tablets (2mg  to 4mg ) PO q4h PRN moderate- severe pain  - continue Tizanidine 2mg  PO q8h PRN muscle spasms  - continue Pregabalin 100mg  PO BID     Generalized anxiety disorder  - Continue Cymbalta 60 mg p.o. BID  - continue Buspar 20 mg p.o. q12hrs   -  Continue Trazodone 50mg  nightly PRN insomnia    #Gout: home Allopurinol   #Allergies: home Flonase    Level of Care: [facility]     Disposition Planning 12/02/2023:   Days to Complete Medical Plan of Care Requiring Ongoing Hospitalization: 2 days out --->Care Needs: Clinical Improvement/Symptom Control: Sepsis                 (Discharge Planning and EDD Info)   Remaining Needs: None    Discharge location: Home   Outpatient Referrals Needed: none      Access:   Port A Cath Single Lumen  - 02/13/23 Right Internal jugular (Active)       Peripheral IV - 20 G Right;Upper  Forearm (Active)       Nephrostomy -  Left (Active)       Nephrostomy -  Left (Active)       Nephrostomy -  Left (Active)       Stent -  Ureteral (left) (Active)         Foley:  PCN  Nephrostomy -  Left (Active)       Nephrostomy -  Left (Active)       Nephrostomy -  Left (Active)     VTE prophylaxis: SQ heparin  heparin-5,000 Units   IV fluids:       CODE status: Full Code  Last BM: 12/02/23 1200   ,Brown,Medium,   Diet: Diet Regular  Nutritional Supplement - Oral Boost Max/Premier Protein; Boost Max - Toni Arthurs; Deliver Supplements: TID        Ulyses Jarred MD PGY3 IM

## 2023-12-02 NOTE — Interdisciplinary (Signed)
Physical Therapy Re-Evaluation    Admitting Physician:  Ronette Deter, MD  Admission Date 11/28/2023    Inpatient Diagnosis:   Problem List         Codes    Impaired functional mobility, balance, gait, and endurance     ICD-10-CM: Z74.09  ICD-9-CM: V49.89    Decreased activities of daily living (ADL)     ICD-10-CM: Z78.9  ICD-9-CM: V49.89            IP Start of Service   Start of Care: 12/02/23  Onset Date: 11/28/23  Reason for referral: Decline in functional ability/mobility    Preferred Language:English         Past Medical History:   Diagnosis Date    Chronic back pain     Congenital hydronephrosis     Gout     Headache     Hematuria     HTN (hypertension) 12/10/2021    Kidney disease     Kidney stones     Major depressive disorder, single episode     Polyarthropathy or polyarthritis of multiple sites     Retinal detachment     Urethral stricture       Past Surgical History:   Procedure Laterality Date    CT INSERTION OF SUPRAPUBIC CATH  09/25/2015    NEPHRECTOMY Right 1955    APPENDECTOMY      COLONOSCOPY      CYSTOSCOPY      CYSTOSCOPY W/ LASER LITHOTRIPSY      OTHER SURGICAL HISTORY      Interstim 01/29/2011    SPINE SURGERY  09/21    Lumbar-sacral fusion    TRANSURETHRAL RESECTION OF PROSTATE          PT Acute       Row Name 12/02/23 1100          Type of Visit    Type of Physical Therapy note Physical Therapy Re-Evaluation       Row Name 12/02/23 1100          Treatment Precautions/Restrictions    Precautions/Restrictions Fall;Multiple lines     Fall Socks/charm       Row Name 12/02/23 1100          Medical History    History of presenting condition 44M h/o metastatic urothelial carcinoma, HTN, depression, gout, and insomnia here for feeling septic, found to have retraction of L PCN w left hydronephrosis w superimposed stranding, c/f urosepsis.     Fall history No falls reported in the last 6 months     Other Past Medical History Information HTN, depression, arthritis, urothelial cancer s/p chemo and  surgery, solitary L kidney, chronic indswelling foley, CKD, BPH s/p TURP, gout       Row Name 12/02/23 1100          Functional History    Prior Level of Function Minimal deficits     Equipment required for mobility in the home Lupita Shutter Name 12/02/23 1100          Social History    Living Situation Lives with parent/family     Home Environment House     Home accessibility  Accessible with wheelchair or walker;Performs activities of daily living (ADL's) on one level     Other Social History Information reports sister can assist       Row Name 12/02/23 1100          Subjective  Subjective Information Patient reports that he wants to walk     Patient status Nursing in agreement for treatment;Patient agreeable to treatment       Row Name 12/02/23 1100          Pain Assessment    Pain Asssessment Tool Numeric Pain Rating Scale       Row Name 12/02/23 1100          Numeric Pain Rating Scale    Pain Intensity - rating at present 8     Pain Intensity- rating after treatment 8     Location chronic back pain       Row Name 12/02/23 1100          Objective    Overall Cognitive Status Impaired     Other  Cognitive Status Information impulsive, easily distracted     Communication Speech Imparired     Other  Communication Information mild dysarthria     Coordination/Motor control No limitations or impairments noted. Movement patterns are fluid and coordinated throughout     Balance Balance limitations present     Static Sitting Balance Good - able to maintain balance without handhold support, limited postural sway     Dynamic Sitting Balance Good - accepts moderate challenge, able to maintain balance while picking object off floor     Static Standing Balance Good - able to maintain balance without handhold support, limited postural sway     Dynamic Standing Balance Fair - accepts minimal challenge, able to maintain balance while turning head/trunk     Other Balance Information standing balance with BUE support on 4WW,  fair to poor standing balance without UE support     Extremity Assessment Range of motion, strength,  muscle tone and/or sensation limitations present     LLE findings grossly 4/5 strength     RLE findings grossly 4/5 strength     Functional Mobility Functional mobility deficits present     Bed Mobility Minimum assistance (25% assistance)     Bed Mobility Comments OOB towards patient's L     Transfers to/from Stand Other (comments)  SBA     Transfer Comments from bed with 4WW     Gait Other (comments)  SBA     Gait Comments x300 feet total with 4 standing rest breaks 2/2 fatigue. Demonstrates decreased cadence. One posterior LOB during break when not holding onto walker requiring min A     Device used for ambulation/mobility Other (comments)     Device used Comments 4QI     Other Objective Findings Patient presented supine in bed, agreeable to PT. Patient performed mobility as described above, returned to bed and left with needs met.                          Eval cont.       Row Name 12/02/23 1100          [place] AM-PAC: Basic Mobility    Assistance Needed to Turn from Back to Side While in a Flat Bed Without Using Bedrails 4 - None (independent)     Difficulty with Supine to Sit Transfer 3 - A little (supervised/min assist)     How Much Help Needed to Move to/from Bed to Chair 3 - A little (supervised/min assist)     Difficulty with Sit to Stand Transfer from Chair with Arms 3 - A little (supervised/min assist)     How Much Help Needed  to Walk in Room 3 - A little (supervised/min assist)     How Much Help Needed to Climb 3-5 Steps with a Rail 3 - A little (supervised/min assist)     AMPAC Total Score 19     Assessment: AM-PAC Basic Mobility Impairment Rating Score 19-22 - 20-39% impaired       Row Name 12/02/23 1100          Patient/Family Education    Learner(s) Patient     Learner response to rehab patient education interventions Verbalizes understanding;Able to return demonstrate teaching     Patient/family  training comments role of PT, safe mobility       Row Name 12/02/23 1100          Assessment    Assessment PT re-eval completed. Patient with more impaired balance this date, primarily requiring SBA for mobility. He will benefit from PT to address deficits identified.    POST ACUTE REHABILITATION DISCHARGE RECOMMENDATIONS:    Recommended Post-Acute Therapy Follow up:   Home Health    Patient appropriate for discharge to prior living situation    Deficits :   All mobility: assist level: Other      Other Post Acute Discharge Recommendations : SBA. If family unable to provide sufficient assist, recommend DC to facility.     Equipment recommendations:   None         Rehab Potential Good       Row Name 12/02/23 1100          Goal 1 (Short Term)    Impairment Functional mobility limitation     Custom goal Patient will require no more than supervision for bed mobility and transfers with LRAD     Number of visits 6     Goal Status New       Row Name 12/02/23 1100          Goal 2 (Short Term)    Impairment Gait impairment     Custom goal Patient will require no more than supervision for ambulation x150+ feet with LRAD     Number of visits 6     Goal Status New       Row Name 12/02/23 1100          Goal 3 (Short Term)    Impairment Balance impairment     Balance To improve postural control and safety in standing, patient able to maintain static standing balance without handhold support and limited postural sway     Number of visits 6     Goal Status New       Row Name 12/02/23 1100          Planned Therapy Interventions and Rationale    Gait Training to normalize gait pattern and improve safety while ambulating with assistive device     Neuromuscular Re-Education to improve safety during dynamic activities;to improve kinesthetic awareness and postural control     Therapeutic Activities to improve functional mobility and ability to navigate in the home and/or community;to improve transfers between surfaces     Theraputic  Exercise to improve activity tolerance to allow greater independence with functional mobility skills;to increase strength to allow greater independence with functional mobility skills       Row Name 12/02/23 1100          Treatment Plan Disussion    Treatment Plan Discussion and Agreement Patient/family/caregiver stated understanding and agreement with the therapy plan       Row  Name 12/02/23 1100          Treatment Plan    Continue therapy to address Decline in functional ability/mobility     Frequency of treatment 3 times per week     Duration of treatment (number of visits) While patient is hospitalized and in need of skilled therapy services     Status of treatment Patient evaluated and will benefit from ongoing skilled therapy       Row Name 12/02/23 1100          Patient Safety Considerations     Patient safety considerations Patient returned to bed at end of treatment;Call light left in reach and fall precautions in place;Nursing notified of safety considerations at end of treatment     Patient assistive device requirements for safe ambulation Lupita Shutter Name 12/02/23 1100          Therapy Plan Communication    Therapy Plan Communication Discussed therapy plan with Nursing and/or Physician;Encouraged out of bed with assistance by     Encouraged out of bed with assistance by Nursing;Staff       Row Name 12/02/23 1100          Physical Therapy Patient Discharge Instructions    Your Physical Therapist suggests the following Continue to use your assistive device as instructed when walking to improve your stability and prevent falls       Row Name 12/02/23 1100          Type of Eval    Re-Evaluation [phone]) Completed       Row Name 12/02/23 1100          Therapeutic Procedures    Gait Training ([id number]) Assistive device training;Dynamic activities while walking;Gait pattern analysis and treatment of deviations;Patient education        Total TIMED Treatment (min)  15     Therapeutic Activities [phone]   Assistance/facilitation of bed mobility;Patient education;Progressive mobilization to improve functional independence;Transfer training with weight shift and direction change        Total TIMED Treatment (min)  8       Row Name 12/02/23 1100          Treatment Time     Total TIMED Treatment  (min) --  23     Total Treatment Time (min) 38     Treatment start time 1035                        The physical therapist of record is endorsed by evaluating physical therapist.

## 2023-12-02 NOTE — Interdisciplinary (Signed)
Report given to Alisha RN

## 2023-12-03 LAB — CBC WITH DIFF, BLOOD
ANC-Automated: 1.6 10*3/uL (ref 1.6–7.0)
Abs Basophils: 0 10*3/uL (ref <0.2)
Abs Eosinophils: 0.2 10*3/uL (ref 0.0–0.5)
Abs Lymphs: 1.3 10*3/uL (ref 0.8–3.1)
Abs Monos: 0.4 10*3/uL (ref 0.2–0.8)
Basophils: 0.6 %
Eosinophils: 4.6 %
Hct: 27.2 % — ABNORMAL LOW (ref 40.0–50.0)
Hgb: 8.8 g/dL — ABNORMAL LOW (ref 13.7–17.5)
Imm Gran %: 0.3 % (ref <1)
Imm Gran Abs: 0 10*3/uL (ref <0.1)
Lymphocytes: 36.5 %
MCH: 30.1 pg (ref 26.0–32.0)
MCHC: 32.4 g/dL (ref 32.0–36.0)
MCV: 93.2 um3 (ref 79.0–95.0)
MPV: 10.7 fL (ref 9.4–12.4)
Monocytes: 10.4 %
Plt Count: 139 10*3/uL — ABNORMAL LOW (ref 140–370)
RBC: 2.92 10*6/uL — ABNORMAL LOW (ref 4.60–6.10)
RDW: 14.6 % — ABNORMAL HIGH (ref 12.0–14.0)
Segs: 47.6 %
WBC: 3.5 10*3/uL — ABNORMAL LOW (ref 4.0–10.0)

## 2023-12-03 LAB — BASIC METABOLIC PANEL, BLOOD
Anion Gap: 9 mmol/L (ref 7–15)
BUN: 22 mg/dL (ref 8–23)
Bicarbonate: 29 mmol/L (ref 22–29)
Calcium: 9 mg/dL (ref 8.5–10.6)
Chloride: 107 mmol/L (ref 98–107)
Creatinine: 1.27 mg/dL — ABNORMAL HIGH (ref 0.67–1.17)
Glucose: 94 mg/dL (ref 70–99)
Potassium: 3.1 mmol/L — ABNORMAL LOW (ref 3.5–5.1)
Sodium: 145 mmol/L (ref 136–145)
eGFR Based on CKD-EPI 2021 Equation: >60 mL/min/{1.73_m2}

## 2023-12-03 LAB — BLOOD CULTURE
Blood Culture Result: NO GROWTH
Blood Culture Result: NO GROWTH

## 2023-12-03 MED ORDER — POLYETHYLENE GLYCOL 3350 OR PACK
17.0000 g | PACK | Freq: Every day | ORAL | Status: DC
Start: 2023-12-03 — End: 2023-12-04
  Administered 2023-12-03: 17 g via ORAL
  Filled 2023-12-03: qty 1

## 2023-12-03 MED ORDER — METHYLNALTREXONE BROMIDE 12 MG/0.6ML SC SOLN
6.0000 mg | Freq: Once | SUBCUTANEOUS | Status: AC
Start: 2023-12-03 — End: 2023-12-03
  Administered 2023-12-03: 6 mg via SUBCUTANEOUS
  Filled 2023-12-03: qty 0.6

## 2023-12-03 NOTE — Interdisciplinary (Signed)
Nutrition Note      Evaluation Type: Initial    Recommendations:    -Patient meets criteria for Moderate Malnutrition    - Continue Regular diet + Boost Max TID  - Provide multivitamin with minerals daily  - Obtain Vitamin D lab given risk for deficiency  - Monitor and replete lytes prn; low K  - Bowel regimen per MD  - Weights q72hrs for trends; standing preferable  - Continue Fults OP RD pre-established care                                                                                                                                         A: Per MD: 64M h/o metastatic urothelial carcinoma, HTN, depression, gout, and insomnia here for feeling septic, found to have retraction of L PCN w left hydronephrosis w superimposed stranding, c/f urosepsis.     Doing ok, still feels concerned about going home d/t unsteadiness/weak legs. Thinks his leg will give out and he will not be able to get up. Open to disimpaction today.     Nutrition Summary   Current Nutrition Regimen: Regular diet + Boost Max TID  Source of Information: Chart Review;Spoke with patient  Barriers to Intake: Decreased appetite;Constipation  Additional Comments: Visited pt in unit, pt stated poor PO during admission d/t feeling unwell and current constipation. Had trouble with setting up tray, RD helped pt. Pt stated good PO at home, just was feeling sick for a week and had low appetite. Sees OP RD previously, drinks ~2 Nepro per day. Ok with current ONS while inpatient, likes taste. No issues swallowing but does not have dentures currently, so hard to chew. NKFA. No recent N/V, previously with diarrhea, now feeling constipated, noted LBM today, diarrhea.  Adequacy of Nutrition Intake: Meeting 25-50% of estimated needs    Tray Items Taken for the past 168 hrs:   Number of Items Taken Number of Items on Tray Diet Tolerance   11/29/23 1800 2 4 --   12/01/23 1341 1 4 Tolerates     Supplement Intake for the past 168 hrs:   Diet Supplements Supplement  -  Intake (mL) Supplement Tolerance   12/01/23 1800 -- 200 mL Tolerated well   12/02/23 1200 -- 120 mL --   12/02/23 1800 -- 0 mL --   12/03/23 0750 Boost Max/Premier Protein -- --       Anthropometrics   Height - Most Recent Measurement   11/28/23 5\' 7"  (1.702 m)       Weight For Nutrition Equations: 61.7 kg (135 lb 15.6 oz) (admit bed wt - 5 kg for small volume ascites)  Weight Trends: ?weights, unsure if pt had lost wt in recent weeks or is within baseline, will monitor for standing wt  BMI for Nutrition Calculations: 21.29  Ideal Body Weight (kg): 67.14  Percent of Ideal Body Weight: 91.87 %  Usual  Body Weight (Dietary): 76.2 kg (168 lb) (highet wt, x2 months ago)  Change from UBW (%): -19.06 %  Time Frame of Weight Change From UBW: 1-3 months    Weights (last 14 days)       Date/Time Weight Weight Source Percentage Weight Change (%) Who    12/01/23 06:40:15 76.1 kg (167 lb 12.3 oz) Bed scale 13.58 % HH    11/28/23 1412 67 kg (147 lb 11.3 oz) Bed scale 0 % [facility]           Wt Readings from Last 20 Encounters:   12/01/23 76.1 kg (167 lb 12.3 oz)   11/23/23 72.3 kg (159 lb 8 oz)   11/06/23 71.6 kg (157 lb 12.8 oz)   10/18/23 74.3 kg (163 lb 12.8 oz)   10/09/23 76.4 kg (168 lb 8 oz)   09/21/23 75.9 kg (167 lb 6.4 oz)   09/04/23 74.7 kg (164 lb 11.2 oz)   08/24/23 73.7 kg (162 lb 6.3 oz)   08/24/23 73.7 kg (162 lb 6.4 oz)   08/14/23 73.2 kg (161 lb 4.8 oz)   08/10/23 72.6 kg (160 lb)   07/27/23 72 kg (158 lb 11.7 oz)   07/27/23 73 kg (160 lb 15 oz)   07/20/23 74.2 kg (163 lb 9.3 oz)   07/20/23 74.2 kg (163 lb 9.3 oz)   07/20/23 74.2 kg (163 lb 9.3 oz)   07/06/23 71.9 kg (158 lb 8.2 oz)   07/06/23 71.9 kg (158 lb 8.2 oz)   07/04/23 72.6 kg (160 lb)   07/03/23 73.5 kg (162 lb)     Estimated Needs  Calories: 1850 kcal/day - 2159 kcal/day (30 kcal/kg/day - 35 kcal/kg/day x 61.7 kg (135 lb 15.6 oz))  Protein: 74 g/day - 93 g/day (1.2 g/kg/day - 1.5 g/kg/day x 61.7 kg (135 lb 15.6 oz))  Fluids: 1850 mL/day - 2159 mL/day (30  mL/kg/day - 35 mL/kg/day x 61.7 kg (135 lb 15.6 oz)) or per MD    Huey Romans Equation: 1341  [employer] Equation:    [employer] Equation Used:    Indirect Calorimetry Results:      Nutrition Focused Physical Exam   Body Fat Loss    Orbital (!) Moderate (12/03/23 1013)   Upper Arm (!) Severe (12/03/23 1013)   Thoracic/Lumbar (!) Moderate (12/03/23 1013)   Muscle Mass Loss    Temple (!) Severe (12/03/23 1013)   Clavicle Bone Region (!) Moderate (12/03/23 1013)   Deltoid (!) Severe (12/03/23 1013)   Scapula Bone Region (!) Severe (12/03/23 1013)   Interosseous (!) Severe (12/03/23 1013)   Anterior Thigh (!) Moderate (12/03/23 1013)   Patellar Region (!) Moderate (12/03/23 1013)   Posterior Calf (!) Moderate (12/03/23 1013)   Micronutrient Deficiency none (12/03/23 1013)     Edema:         Malnutrition Diagnostic Criteria - Chronic Illness  Malnutrition Designation: Moderate  Energy Intake: not applicable  Weight Loss: >7.5% in 3 Months  Fluid Accumulation: Not Applicable  Body Fat Loss: Severe  Muscle Mass Loss: Severe  Chronic Dx Status: New    Clinical Considerations:   Allergies: Sulfa drugs  IV Access - Peripheral:  Peripheral IV - 20 G Right;Upper  Forearm (Active)     IV Access - Central  Port A Cath Single Lumen  - 02/13/23 Right Internal jugular (Active)     Tubes and Drains:  Nephrostomy -  Left (Active)        GI:  Stool Assessment for the past 168 hrs:   Stool Amount Stool Occurrence Stool Color Stool Appearance Stool Source   12/01/23 0300 Small 1 -- -- --   12/01/23 1341 -- 1 -- -- --   12/02/23 0458 Medium 1 Brown Partially liquid --   12/02/23 0700 Medium 1 Brown Entirely liquid --   12/02/23 1200 Medium 1 Brown Entirely liquid --   12/02/23 1745 Small 1 Brown Entirely liquid --   12/02/23 1800 Medium 1 Brown Entirely liquid --   12/03/23 0000 Medium 1 Brown Partially liquid Incontinent;Diaper   12/03/23 0245 Medium 1 Black Partially liquid Diaper   12/03/23 0700 Large 1 Brown Partially liquid  Diaper;Incontinent       Skin Integrity:  Skin Integrity (WDL): Exceptions to WDL  Skin Integrity  Skin Color: Normal for ethnicity  Skin Temp: Warm  Generalized Skin Integrity: Ecchymosis    Wounds/Incisions:       Pressure Injuries:  Pressure Ulcer/ Injury -  Unknown (if pressure related, contact WOCN to stage) Sacrum Medial Deep Tissue Pressure Injury (Active)       Labs: reviewed   Recent Labs     12/01/23  0508 12/02/23  0446 12/03/23  0442   NA 143 146* 145   K 3.5 3.5 3.1*   CL 105 108* 107   BICARB 29 28 29    BUN 24* 22 22   CREAT 1.46* 1.31* 1.27*   GLU 86 89 94   Olar 9.2 8.8 9.0   WBC  --  3.6* 3.5*   ABSNEUTRO  --  1.8 1.6       Lab Results   Component Value Date    CHOL 130 09/21/2023    TRIG 125 09/21/2023       No results found for: "A1C"    No results for input(s): "GLUCPOCT" in the last 72 hours.    Lab Results   Component Value Date    VITAMIND25HY 77 03/23/2023       Medication Review Comments: reviewed  IV:   Scheduled:    allopurinol  100 mg Daily    ampicillin (OMNIPEN) IVPB  1,000 mg Q6H    busPIRone  20 mg Q12H    DULoxetine  20 mg BID    fluticasone propionate  2 spray Daily    heparin  5,000 Units Q12H    hydrocerin   TID    nystatin  5 mL 4x Daily    [Manual MAR Hold] polyethylene glycol  17 g Daily    pregabalin  100 mg BID    [Manual MAR Hold] senna  2 tablet HS    sodium chloride  3 mL Q8H       Discharge: pending clinical course    Education: when clinically appropriate     RD/DTR to monitor/evaluate: labs, weight trends, oral or nutrition support status, and signs and symptoms of new skin concerns. Relayed recommendations to MD.     Will continue to follow patient per approved [place] Nutrition Prioritization Schedule guidelines. Nutrition Services remains available via IP Nutrition Consult should patient medical status change.    Edward Qualia, [place]  12/03/2023

## 2023-12-03 NOTE — Progress Notes (Signed)
MEDICINE PROGRESS NOTE    Hospital day:   5 days - Admitted on: 11/28/2023    Subjective / Interval Events     Stephen [name] h/o metastatic urothelial carcinoma, HTN, depression, gout, and insomnia here for feeling septic, found to have retraction of L PCN w left hydronephrosis w superimposed stranding, c/f urosepsis.    Feeling better today  Feels severly constipated despite 6 stools, wants to have some more BMs then dc tomorrow     Objective        Last 24 hours  Temperature:  [96.8 F (36 C)-98 F (36.7 C)] 97.6 F (36.4 C) (12/15 0754)  Blood pressure (BP): (116-165)/(50-77) 128/60 (12/15 0754)  Heart Rate:  [47-55] 49 (12/15 0754)  Respirations:  [16-17] 16 (12/15 0754)  Pain Score: 7 (12/15 1108)  O2 Device: None (Room air) (12/15 0754)  SpO2:  [96 %-99 %] 97 % (12/15 0754)  Urine x0 Stool x6 Emesis x0   12/14 0600 - 12/15 0559  In: 220 [P.O.:120; I.V.:100]  Out: 1825 [Urine:1825]    Gen: laying in bed, NAD  HEENT: NCAT  CV: bradycardic  RESP: lungs clear bilaterally   ABD: soft, yellow urine in L PCN bag  Ext: strength intact bilaterally.      Pertinent Labs & Imaging     WBC 3.5* (12/15) HGB 8.8* (12/15) PLT 139* (12/15)    HCT 27.2* (12/15)      %Neutrophils 47.6 (12/15) %Bands   %Lymphs 36.5 (12/15) %Monos 10.4 (12/15) %Eos 4.6 (12/15) %Basos 0.6 (12/15)  Na 145 (12/15) CL 107 (12/15) BUN 22 (12/15) GLU   94 (12/15)   K 3.1* (12/15) CO2 29 (12/15) Cr 1.27* (12/15)      [place] 9.0 (12/15) Mg   Phos    TP   ALT   TBILI   ALK PHOS      ALB   AST   DBILI          CTAP  IMPRESSION:  Interval retraction of the left nephrostomy tube, now terminating in the inferior pole cortex. This contributes to new moderate left hydronephrosis with superimposed perinephric/periureteral stranding, nonspecific but may represent inflammation/infection. Recommend consultation with interventional radiology for consideration of repositioning     Consolidative left basilar opacities, likely infectious/inflammatory..  Large rectal stool ball  without definite evidence of stercoral colitis. Consider disimpaction.  Stable osseous and retroperitoneum metastatic disease.  Note that assessment of solid organs is limited in the absence of intravenous contrast.    MRI Spine Survey  IMPRESSION:  Patient motion and lumbar hardware susceptibility artifact degrades assessment. Within these limitations:     Redemonstrated postsurgical changes related to L2-S1 posterior instrumented fusion, posterior L2-L5 decompression, and L5-S1 anterior fixation. Interbody cages are again seen from L2-L3 through L5-S1. Stable size of a 6.2 x 2.4 cm (SI by AP) collection within the L3/L4 posterior surgical bed. Please note sterility of the collection cannot be determined by MRI.     Interval increase diffuse osseous metastatic disease within the visualized bones, including the spine, partially imaged ribs and sacrum. No new pathologic fractures identified. Previously noted epidural extension within the midthoracic spine is difficult to delineate from vascular enhancement, though this may be increased within the midthoracic neural foramina. No convincing epidural extension into the bony spinal canal, though again this is difficult to definitively evaluate due to venous epidural enhancement (particularly dorsally at T4).     Minimal enhancement along the cauda equina nerve roots at the L5-S1 levels on  axial imaging is favored to be vascular, though a leptomeningeal processes not completely excluded with certainty.     Otherwise, no convincing suspicious intrathecal, leptomeningeal, intramedullary, or epidural enhancement at evaluable levels, noting poor assessment at postsurgical levels due to susceptibility artifact from spinal hardware.     Of note, no specific findings of osteomyelitis-discitis or epidural abscess. However, there is increased marrow edema due to progressive osseous metastasis, which makes superimposed infection difficult to exclude.     Redemonstrated degenerative  findings with moderate canal stenosis at C4-C5 and C5-C6. Otherwise, no more than mild canal stenosis. No cord or cauda equina nerve root compression.     Evaluation of the neural foramina of the lumbar spine is limited due to susceptibility artifact from hardware, however there is no convincing high-grade foraminal stenosis of the lumbar spine. Probable moderate foraminal stenosis on the right at L1-L2 and possibly bilaterally at L4-L5, poorly characterized. Severe foraminal stenosis on the right at T2-T3 and possibly on the right at C7-T1, poorly characterized. Moderate-to-severe foraminal stenosis on the right at T1-T2 and T10-T11. Other scattered levels of up-to-moderate thoracic foraminal stenosis.    Assessment / Plan   10M h/o metastatic urothelial carcinoma, HTN, depression, gout, and insomnia here for feeling septic, found to have retraction of L PCN w left hydronephrosis w superimposed stranding, c/f urosepsis.    # Sepsis 2/2 UTI  - CTAP with moderate left hydronephrosis with superimposed perinephric/periureteral stranding  - UA + with >100k E Coli, sensitivities pending  - pt non-febrile but presenting w same constellation of weakness and shaking as prior sepsis  - S/p Linezolid 12/11, Zosyn (12/11-12/12)  - E Faecalis pan-susceptible  PLAN  - ampicillin IV, switch to amox on DC    #Imbalance, poa, improving  - Likely 2/2 neuropathic effects on chemo (currently on hold)  - Pt tremor significantly improved s/p antibiotics  - Given progressive weakness, stool balls, MRI spine survey completed  - MRI showed progression of known metastatic disease, no cord or cauda equina nerve root compression.  - PT re-consulted for continued unsteadiness -- rec HH PT    #Large Stool Ball  #Constipation  - was having diarrhea at home so stopped Lactulose; wasn't taking miralax/senna  - stool ball appears close to rectum, will attempt to dislodge with enemas  - s/p mineral oil enema 12/11 wo bowel movement  - Pt not  tolerating golyteley, only one BM 12/12, disimpacted as much as possible 12/13  - Stooling multiple times 12/12, 12/13  PLAN  - Transitioned to daily Miralax/Senna 12/15    #AKI on CKD, resolved  #Moderate L Hydronephrosis   - s/p 12/11 PCN repositioning, draining well  - BL Cr ~1.2-1.3, 1.74 on admission  - S/p 3.5L fluid resuscitation in ED; suspected pre-renal with possible intrinsic damage 2/2 hydro  - 1.45 12/12 ~around baseline    #Metastatic Urothelial Carcinoma  - f/w Dr. Roseanne Reno opt, continuing holding home Erdafitinib 8mg    - continue Acetaminophen 500mg  PO q8h prn mild pain  - continue Hydromorphone 2mg  1-2 tablets (2mg  to 4mg ) PO q4h PRN moderate- severe pain  - continue Tizanidine 2mg  PO q8h PRN muscle spasms  - continue Pregabalin 100mg  PO BID     Generalized anxiety disorder  - Continue Cymbalta 60 mg p.o. BID  - continue Buspar 20 mg p.o. q12hrs   - Continue Trazodone 50mg  nightly PRN insomnia    #Gout: home Allopurinol   #Allergies: home Flonase    Level of Care: [facility]  Disposition Planning 12/03/2023:   Days to Complete Medical Plan of Care Requiring Ongoing Hospitalization: Anticipated Tomorrow --->Care Needs: Clinical Improvement/Symptom Control: continue BMs                 (Discharge Planning and EDD Info)   Remaining Needs: None    Discharge location: Home   Outpatient Referrals Needed: none      Access:   Port A Cath Single Lumen  - 02/13/23 Right Internal jugular (Active)       Peripheral IV - 20 G Right;Upper  Forearm (Active)       Nephrostomy -  Left (Active)       Nephrostomy -  Left (Active)       Nephrostomy -  Left (Active)       Stent -  Ureteral (left) (Active)         Foley:  PCN  Nephrostomy -  Left (Active)       Nephrostomy -  Left (Active)       Nephrostomy -  Left (Active)     VTE prophylaxis: SQ heparin heparin-5,000 Units   IV fluids:       CODE status: Full Code  Last BM: 12/03/23 0700   ,Brown,Large,Diaper;Incontinent (12/03/23 0700)  Diet: Diet Regular  Nutritional  Supplement - Oral Boost Max/Premier Protein; Boost Max - Vanilla; Deliver Supplements: TID

## 2023-12-04 ENCOUNTER — Other Ambulatory Visit: Payer: Self-pay

## 2023-12-04 LAB — BASIC METABOLIC PANEL, BLOOD
Anion Gap: 8 mmol/L (ref 7–15)
BUN: 20 mg/dL (ref 8–23)
Bicarbonate: 30 mmol/L — ABNORMAL HIGH (ref 22–29)
Calcium: 8.8 mg/dL (ref 8.5–10.6)
Chloride: 105 mmol/L (ref 98–107)
Creatinine: 1.25 mg/dL — ABNORMAL HIGH (ref 0.67–1.17)
Glucose: 123 mg/dL — ABNORMAL HIGH (ref 70–99)
Potassium: 3.3 mmol/L — ABNORMAL LOW (ref 3.5–5.1)
Sodium: 143 mmol/L (ref 136–145)
eGFR Based on CKD-EPI 2021 Equation: >60 mL/min/{1.73_m2}

## 2023-12-04 MED ORDER — AMOXICILLIN 875 MG OR TABS
875.0000 mg | ORAL_TABLET | Freq: Two times a day (BID) | ORAL | 0 refills | Status: AC
Start: 2023-12-04 — End: 2023-12-05
  Filled 2023-12-04: qty 2, 1d supply, fill #0

## 2023-12-04 MED ORDER — POLYETHYLENE GLYCOL 3350 OR POWD
17.0000 g | Freq: Every day | ORAL | 0 refills | Status: AC
Start: 2023-12-04 — End: ?
  Filled 2023-12-04: qty 238, 14d supply, fill #0

## 2023-12-04 MED ORDER — SENNA 8.6 MG OR TABS
17.2000 mg | ORAL_TABLET | Freq: Every evening | ORAL | 3 refills | Status: AC
Start: 2023-12-04 — End: ?
  Filled 2023-12-04: qty 60, 30d supply, fill #0

## 2023-12-04 MED ORDER — HEPARIN SODIUM LOCK FLUSH 100 UNIT/ML IJ SOLN CUSTOM
500.0000 [IU] | Freq: Once | INTRAVENOUS | Status: AC | PRN
Start: 2023-12-04 — End: 2023-12-04
  Administered 2023-12-04: 500 [IU]
  Filled 2023-12-04: qty 5

## 2023-12-04 NOTE — Interdisciplinary (Signed)
 12/04/23 1518   Assessment   Assessment Type Initial   Referral Information   Consult Type Other (Comment)  Teaching laboratory technician)   Social Assessment   Prior to Level of Function * Independent with ADL's   Primary Caretaker(s) * Self   Primary Family/Caregiver Contact Name, Number and Relationship Rushie Courier [phone]   Advance Directive Health Care Information Given to Patient   (No, however Pt verbally designates his Clementine Cutting Therese Flash (tel# [phone]) as his Surrogate Medical Decision Maker.)   Living Arrangements on Admission* Family Member   Type of Residence * Other (Comment)  (Home)   Transportation*    (Pt reported that he uses [employer])   Income Information   Income Source Retired/SS/Pension  (Pt reported that his income meets his basic needs.)   Mental Health Assessment   Past Mental Health Issues Pt reported anxiety, and stated that he receives medication for it. Pt denied having SI/HI.   Mental Status No issues   Behavioral Assessment No issues   Physical Assessment No issues   Mental Status - Orientation A&Ox4   Audit-C Questionnaire    Substance Abuse History Pt denied hx of ETOH/Drug Use.   Plans/Interventions/Discharge   Plan/Interventions Explore needs and options for aftercare, provide referrals   Barriers to Discharge * Clinical reason     SOCIAL WORK: Responding to consult for transportation.     SW met with Pt at bedside, and introduced self/role.     INFORMATION: Per chart review, Patient is " a 72 year old male with metastatic urothelial carcinoma, hypertension, depression, gout, presented to the hospital due to shaking and weakness. Pt states he felt that he was become septic, as he had in the past, and presented to [facility]. Pt admits to diarrhea however states he had recently started taking lactulose  which caused him to have frequent loose stools. He has since stopped the lactulose . Pt is a retired Engineer, civil (consulting). Followed with Dr. Annette Barters of oncology. Currently  receiving treatment for metastatic urothelial carcinoma. His chemotherapy (Erdafitinib  8 mg ) has been on hold x 1 week due to diarrhea. 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 Pt states he is typically very independent, ambulates, cares for his daily activities independently but currently does not drive. Lives home with his sister. "    LIVING SITUATION/INCOME: Patient resides in a home with Sister. Patient confirmed the address on facesheet to be correct and current: [address] [place] [address]     Pt reported that he gets SSA and that his income meets his basic needs.    SUPPORTSRushie Courier [phone]    ASSESSMENT: Patient was friendly and receptive to SW assessment. Patient A&Ox4; did not indicate concerns related to mental status (such as speech, eye contact, etc). Presented with euthymic mood and appropriate thought process. Maintained appropriate eye contact and remained engaged throughout entirety of conversation. Patient reported that he would like to stay another day, however " I think my job is done here." Patient confirmed that he does have PCP, Dr. Sherald Dienes through [facility]. Patient confirmed home address and stated Sister will be home to assist Patient. SW called [employer] and notified Patient that [facility] will p/up Patient at 4:45pm. Patient appreciative of SW f/up, no other needs identified.    PLAN:  - SW provided supportive counseling, listened attentively, validated feelings, and acknowledged challenges.  - [facility] arranged. Transport will p/up Pt at 4:45pm.  Its door to door, so Driver will come to Pts room. [place] # [id number] with Charm Coombs, Telephone # [phone]. Buzz Cass has the unit/Pt tel#. RN was made aware. SW also to provide taxi information in the event [facility] doesn't p/up Patient.   - SW to remain available to address pertinent needs/concerns.    Graciella Lavender, LCSW  In-patient Clinical Social Worker

## 2023-12-04 NOTE — Discharge Instructions (Signed)
 Diagnosis and Reason for Admission    You were admitted to the hospital for the following reason(s):  urinary tract infection    Your full diagnosis list is located on this After Visit Summary in the Hospital Problems section.    What Happened During Your Hospital Stay    The main tests and treatments done for you during this hospitalization were:    IR tube replacement  Antibiotic therapy  Cleaned out your stool burden    The following evaluation is still important to complete after discharge from the hospital:  Follow up with your oncologist    Instructions for After Discharge    Your diet at home should be a regular diet.    Your activity level at home should be:  regular activity.    Specific activity restrictions:    Do not drive while taking narcotic pain medications.  Do not work with heavy or complex machinery while taking narcotic pain medications.    Wound or tube care instructions:  None    Your medication list is located on this After Visit Summary in the Current Discharge Medication List section.  Your nurse will review this information with you before you leave the hospital.    It is very important for you to keep a current medication list with you in order to assist your doctors with your medical care.  Bring this After Visit Summary with you to your follow up appointments.         Reasons to Contact a Doctor Urgently    Call 911 or return to the hospital immediately if:  Fevers, chills, severe weakness    You should contact either your primary care physician or your hospital physician for any of the following reasons:  not sure how to take your medications or who to follow up with.     Your hospital physician at the time of hospital discharge is Cosme Dire.    Your physicians strive to explain things in a way you can understand. If you have any questions about your hospital care, your medications, or if you have new or concerning symptoms soon after going home from the hospital, and you need to  contact your hospital physician, they can be contacted in the following manner:     During business hours (Monday-Friday 8 a.m.-5 p.m.), please contact the hospital medicine office at [phone].  Outside of business hours, please contact the [facility] operator at [phone] who can page the on-call physician.       New problems and symptoms unrelated to your hospitalization are best addressed by your primary outpatient physician (PCP), as are requests for medication refills or appointment referrals after hospital discharge.  However, if new issues arise before you can see or contact your PCP, your hospital physician may be able to assist with these issues during this time.    What Needs to Happen Next After Discharge -- Appointments and Follow Up    Any appointments already scheduled at [place] clinics will be listed in the Future Appointments section at the top of this After Visit Summary.      Sometimes tests performed in the hospital do not yet have results by the time a patient goes home.  The following key tests will need to be followed up at your next appointment: None    Medical Home Information    Your primary care provider or clinic currently on file at [facility] is: No Pcp, Per Patient  You  currently have an advance directive or living will on file at [facility]: Yes    Handouts Given to You (if applicable)

## 2023-12-04 NOTE — Plan of Care (Signed)
 Problem: Promotion of Health and Safety  Goal: Promotion of Health and Safety  Description: The patient remains safe, receives appropriate treatment and achieves optimal outcomes (physically, psychosocially, and spiritually) within the limitations of the disease process by discharge.    Information below is the current care plan.  Outcome: Progressing  Flowsheets  Taken 12/04/2023 0800 by Edythe Grand, RN  Patient /Family stated Goal: pain mgmt, resolve of infection  Taken 12/04/2023 0130 by Faith Homes, RN  Guidelines: Inpatient Nursing Guidelines  Individualized Interventions/Recommendations #1: Cluster care to promote rest and optimal healing.  Individualized Interventions/Recommendations #2 (if applicable): Assess for pain and medicated prn.  Individualized Interventions/Recommendations #4 (if applicable): Administer IV abx.  Individualized Interventions/Recommendations #5 (if applicable): Assist pt in ambulating in the hallway.  Taken 12/01/2023 0441 by Aloma Jaksch, RN  Individualized Interventions/Recommendations #3 (if applicable): Assess L nephrostomy site and ensure patency  Note: Pt aaox4, calm, cooperative, ambulated in hallway a good distance with PT today, stable, steady with walker; VSS; denies pains; had small BMs x 2 today; plans for DC home today with taxi voucher by SW; IV ampicillin  completed today.

## 2023-12-04 NOTE — Discharge Summary (Addendum)
 Date of Admission:  11/28/2023  Date of Discharge:  12/04/2023    Patient Name:  Stephen [name]    Principal Diagnosis (required):  UTI (urinary tract infection)    Hospital Problem List (required):  Active Hospital Problems    Diagnosis    *UTI (urinary tract infection) [N39.0]      Resolved Hospital Problems   No resolved problems to display.   Sepsis 2/2 UTI due to L PCN     Additional Hospital Diagnoses ("rule out" or "suspected" diagnoses, etc.):  Malnutrition Diagnosis (if any): Moderate Malnutrition     Malnutrition Designation: Moderate         Principal Procedure During This Hospitalization (required):  IR replacement of L PCN     Other Procedures Performed During This Hospitalization (required):  IR Change Nephrostomy Tube    Result Date: 12/04/2023  IMPRESSION: Successful nephrostomy exchange on the left side with a 10.2 Fr tube. Plan: Routine nephrostomy exchange in 12 weeks.     MRI Spine Survey W/WO IV Contrast    Result Date: 12/02/2023  IMPRESSION: Patient motion and lumbar hardware susceptibility artifact degrades assessment. Within these limitations: Redemonstrated postsurgical changes related to L2-S1 posterior instrumented fusion, posterior L2-L5 decompression, and L5-S1 anterior fixation. Interbody cages are again seen from L2-L3 through L5-S1. Stable size of a 6.2 x 2.4 cm (SI by AP) collection within the L3/L4 posterior surgical bed. Please note sterility of the collection cannot be determined by MRI. Interval increase diffuse osseous metastatic disease within the visualized bones, including the spine, partially imaged ribs and sacrum. No new pathologic fractures identified. Previously noted epidural extension within the midthoracic spine is difficult to delineate from vascular enhancement, though this may be increased within the midthoracic neural foramina. No convincing epidural extension into the bony spinal canal, though again this is difficult to definitively evaluate due to venous  epidural enhancement (particularly dorsally at T4). Minimal enhancement along the cauda equina nerve roots at the L5-S1 levels on axial imaging is favored to be vascular, though a leptomeningeal processes not completely excluded with certainty. Otherwise, no convincing suspicious intrathecal, leptomeningeal, intramedullary, or epidural enhancement at evaluable levels, noting poor assessment at postsurgical levels due to susceptibility artifact from spinal hardware. Of note, no specific findings of osteomyelitis-discitis or epidural abscess. However, there is increased marrow edema due to progressive osseous metastasis, which makes superimposed infection difficult to exclude. Redemonstrated degenerative findings with moderate canal stenosis at C4-C5 and C5-C6. Otherwise, no more than mild canal stenosis. No cord or cauda equina nerve root compression. Evaluation of the neural foramina of the lumbar spine is limited due to susceptibility artifact from hardware, however there is no convincing high-grade foraminal stenosis of the lumbar spine. Probable moderate foraminal stenosis on the right at L1-L2 and possibly bilaterally at L4-L5, poorly characterized. Severe foraminal stenosis on the right at T2-T3 and possibly on the right at C7-T1, poorly characterized. Moderate-to-severe foraminal stenosis on the right at T1-T2 and T10-T11. Other scattered levels of up-to-moderate thoracic foraminal stenosis.     CT Abdomen And Pelvis W/O Contrast    Result Date: 11/29/2023  IMPRESSION: Interval retraction of the left nephrostomy tube, now terminating in the inferior pole cortex. This contributes to new moderate left hydronephrosis with superimposed perinephric/periureteral stranding, nonspecific but may represent inflammation/infection. Recommend consultation with interventional radiology for consideration of repositioning Consolidative left basilar opacities, likely infectious/inflammatory.. Large rectal stool ball without  definite evidence of stercoral colitis. Consider disimpaction. Stable osseous and retroperitoneum metastatic disease. Note that  assessment of solid organs is limited in the absence of intravenous contrast. Important findings delineated above were seen at 09:10 on 11/29/2023 and were verbally communicated by Clayburn Curb  to the care provider, Federico Hopkins, MD, at 09:10 on 11/29/2023.   CTRM:2002:verbal.     US  Lower Extremity Venous Duplex Bilateral    Result Date: 11/28/2023  IMPRESSION: No DVT in the bilateral lower extremity.    X-Ray Chest Single View    Result Date: 11/28/2023  IMPRESSION: No pneumonia, oedema or pneumothorax. Subcutaneous venous port remains in place in the right chest wall. Mixed osseous changes with some sclerotic metastases less conspicuous, others new.          Consultations Obtained During This Hospitalization:  Hematology oncology  IR    Reason for Admission to the Hospital / History of Present Illness:  Stephen [name] is a 72 year old male with metastatic urothelial carcinoma, hypertension, depression, gout, presented to the hospital due to shaking and weakness. Pt states he felt that he was become septic, as he had in the past, and presented to [facility]. Pt admits to diarrhea however states he had recently started taking lactulose  which caused him to have frequent loose stools. He has since stopped the lactulose . Pt is a retired Engineer, civil (consulting). Followed with Dr. Annette Barters of oncology. Currently receiving treatment for metastatic urothelial carcinoma. His chemotherapy (Erdafitinib  8 mg ) has been on hold x 1 week due to diarrhea. 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 Pt states he is typically very independent, ambulates, cares for his daily activities independently but currently does not drive. Lives home with his sister.      In ED, pt was noted to be hypotensive. Seen by ICU. Given 2.5L IV Fluids, deemed appropriate for IMU level of care    Hospital Course by  Problem (required):  Sepsis secondary to UTI  - admitted to medicine. IR performed PCN exchange given malpositioned PCN. Patient was treated with zosyn , narrowed to ampicillin  for enterococcus faecalis. He will finish a 7 day of antibiotics targetting enteroccocus faecalis.     Severe constipation  Rectal stool impaction  - disimpaction performed, golytely  washout thereafter  - recommended miralax  and senna on discharge     AKI on CKD - improved    Metastatic urothelial carcinoma  Cancer related pain  SPine metastases  - spine survey performed due to generalized weakness, however found to be non acute, moreso relateed to weakness from sepsis  - Dr Annette Barters saw the patient and will follow up in clinic to determine timing to restart antineoplastic medications.     Tests Outstanding at Discharge Requiring Follow Up:  There are no tests or other items that require follow up after hospital discharge.      Discharge Condition (required):  Good.    Key Physical Exam Findings at Discharge:  Mental Status Exam: Patient is alert and oriented to person, place, time, and situation.  Physical examination is significant for:  PCN bag with clear yellow urine .    Discharge Diet:  Regular.    Discharge Disposition:  Home with home care services:  home physical therapy.        Discharge Code Status:  Full code / full care  This code status is not changed from the time of admission.    Discharge Medications:     What To Do With Your Medications        START taking these medications  Add'l Info   amoxicillin  875 MG tablet  Commonly known as: AMOXIL   Take 1 tablet (875 mg) by mouth 2 times daily for 1 day.   Quantity: 2 tablet  Refills: 0     polyethylene glycol 17 GM/SCOOP powder  Commonly known as: GLYCOLAX   Take 17 grams (1 capful) by mouth daily. Mix with 4 to 8 ounces of fluid (water , juice, soda, coffee, or tea) prior to administration.   Quantity: 238 g  Refills: 0     senna 8.6 MG tablet  Commonly known as: SENOKOT  Take 2  tablets (17.2 mg) by mouth at bedtime.   Quantity: 60 tablet  Refills: 3            CONTINUE taking these medications        Add'l Info   acetaminophen  500 MG tablet  Commonly known as: TYLENOL   Take 2 tablets (1,000 mg) by mouth every 8 hours as needed for Mild Pain (Pain Score 1-3) or Moderate Pain (Pain Score 4-6).   Quantity: 40 tablet  Refills: 0     Alaway  0.035 % (0.025 % base) ophthalmic solution  Place 1 drop into both eyes 2 times daily.  Generic drug: ketotifen  fumarate   Refills: 0     allopurinol  100 MG tablet  Commonly known as: ZYLOPRIM   Take 1 tablet (100 mg) by mouth daily.   Quantity: 30 tablet  Refills: 0     Antacid Regular Strength 200-200-20 MG/5ML suspension  Mix in equal parts Benadryl , Maalox, and lidocaine  and swish and spit or swallow 10 ml of mixture by mouth every 4 hours as needed for pain. If swallowed, wait 10 minutes prior to eating/drinking. May also apply medication to Q-tip and dab on areas of sores  (Mix viscous lidocaine  2%, Mylanta, and Benadryl  liquid into a 1:1:1 solution.  Take 10mL by mouth every 4 hours as needed for pain.  Swish and spit or swallow for pain. If swallowed, wait 10 minutes prior to eating/drinking. May also apply medication to a Q-tip and dab on areas of sores.)  Generic drug: aluminum -magnesium -simethicone    Quantity: 355 mL  Refills: 2     Balversa  4 MG Tabs  Take 2 tablets by mouth daily  (Take 8 mg by mouth daily.)  Generic drug: Erdafitinib    Quantity: 60 tablet  Refills: 3     busPIRone  10 MG tablet  Commonly known as: BUSPAR   Take 2 tablets (20 mg) by mouth 2 times daily.   Quantity: 60 tablet  Refills: 2     diphenhydrAMINE  12.5 MG/5ML liquid  Commonly known as: BENADRYL   Mix in equal parts Benadryl , Maalox, and lidocaine  and swish and spit or swallow 10 ml of mixture by mouth every 4 hours as needed for pain. If swallowed, wait 10 minutes prior to eating/drinking. May also apply medication to Q-tip and dab on areas of sores  (Mix viscous lidocaine   2%, Mylanta, and Benadryl  liquid into a 1:1:1 solution.  Take 10mL by mouth every 4 hours as needed for pain.  Swish and spit or swallow for pain. If swallowed, wait 10 minutes prior to eating/drinking. May also apply medication to a Q-tip and dab on areas of sores.)   Quantity: 236 mL  Refills: 2     DULoxetine  20 MG CR capsule  Commonly known as: CYMBALTA   Take 1 capsule (20 mg) by mouth 2 times daily.   Quantity: 180 capsule  Refills: 2     fluticasone  propionate 50 MCG/ACT nasal  spray  Commonly known as: FLONASE   Spray 2 sprays into each nostril daily.   Quantity: 16 g  Refills: 2     HYDROmorphone  2 MG tablet  Commonly known as: DILAUDID   Take 1 to 2 tablets (2mg  to 4mg ) by mouth every 4 hours as needed for moderate to severe pain   Quantity: 90 tablet  Refills: 0     lidocaine  2 % solution  Commonly known as: XYLOCAINE   Mix in equal parts Benadryl , Maalox, and lidocaine  and swish and spit or swallow 10 ml of mixture by mouth every 4 hours as needed for pain. If swallowed, wait 10 minutes prior to eating/drinking. May also apply medication to Q-tip and dab on areas of sores  (Mix viscous lidocaine  2%, Mylanta, and Benadryl  liquid into a 1:1:1 solution.  Take 10mL by mouth every 4 hours as needed for pain.  Swish and spit or swallow for pain. If swallowed, wait 10 minutes prior to eating/drinking. May also apply medication to a Q-tip and dab on areas of sores.)   Quantity: 200 mL  Refills: 2     lidocaine  5 % patch  Commonly known as: LIDODERM   Apply 1 patch topically every 24 hours. Leave patch on for 12 hours, then remove for 12 hours.   Quantity: 30 patch  Refills: 2     multivitamin with minerals Tabs tablet  Take 1 tablet by mouth daily.   Quantity: 30 tablet  Refills: 1     naloxone  4 mg/0.1 mL nasal spray  Commonly known as: NARCAN   For suspected opioid overdose, call 911! Then spray once in one nostril. Repeat after 3 minutes if no or minimal response using a new spray in other nostril.   Quantity: 2  each  Refills: 0     Nepro Liqd  Take 3 Cans by mouth daily.   Quantity: 30000 mL  Refills: 11     nystatin  100,000 units/mL suspension  Commonly known as: MYCOSTATIN   Swish and swallow 5mL by mouth 4 times daily  (5 mL (500,000 Units) by Swish & Swallow route 4 times daily.)   Quantity: 280 mL  Refills: 0     pregabalin  100 MG capsule  Commonly known as: LYRICA   Take 1 capsule (100 mg) by mouth 2 times daily.   Quantity: 60 capsule  Refills: 0     prochlorperazine  10 MG tablet  Commonly known as: COMPAZINE   Take 1 tablet (10 mg) by mouth every 6 hours as needed (Nausea/Vomiting).   Quantity: 90 tablet  Refills: 2     SOOTHE XP OP   Refills: 0     tizanidine  2 MG tablet  Commonly known as: ZANAFLEX   Take 1 tablet (2 mg) by mouth every 8 hours as needed (muscle spasm).   Quantity: 30 tablet  Refills: 2     traZODone  50 MG tablet  Commonly known as: DESYREL   Take one tab nightly by mouth at bedtime as needed for insomnia   Quantity: 90 tablet  Refills: 0               Where to Get Your Medications        These medications were sent to [facility]  [id number], [place] [address]      Hours: Mon-Fri: 8:30am-7:00pm; Sat-Sun & Holidays: 9:00am-5:00pm Phone: [phone]   amoxicillin  875 MG tablet  polyethylene glycol 17 GM/SCOOP powder  senna 8.6 MG tablet  Allergies:  )  Allergies   Allergen Reactions    Sulfa Drugs Unspecified, Hives and Other       Follow Up Appointments:    Already Scheduled:  Future Appointments   Date Time Provider Department Center   12/22/2023  2:00 PM Sheela Denmark, MD [facility] [place] [facility]   12/25/2023  9:00 AM Tolbert Fothergill, MD [facility]       PostDischarge Referrals and Orders       Consult/Referral to Primary Care      Oncology Transitions of Care Discharge Follow Up (Established Oncology Patients Only)            Certain appointment types may not appear in this section. Refer to the Post Discharge Referrals section of the After Visit  Summary for further information.    Discharging Physician's Contact Information:  [facility] operator at [phone].    Time Spent - (for use by billing providers only)  40 minutes were spent in direct patient care activities, discharge planning, and coordination of care.

## 2023-12-04 NOTE — Interdisciplinary (Signed)
 Occupational Therapy Daily Treatment Note    Admitting Physician:  Marda Shack, MD  Admission Date 11/28/2023    Inpatient Diagnosis:   Problem List         Codes    Impaired functional mobility, balance, gait, and endurance     ICD-10-CM: Z74.09  ICD-9-CM: V49.89    Decreased activities of daily living (ADL)     ICD-10-CM: Z78.9  ICD-9-CM: V49.89            IP Start of Service  Start of Care: 11/30/23  Onset Date: 11/28/2023  Reason for referral: Decline in performance of activities of daily living (ADL);Activity tolerance limitation;Decline in functional ability/mobility    Preferred Language:English         Past Medical History:   Diagnosis Date    Chronic back pain     Congenital hydronephrosis     Gout     Headache     Hematuria     HTN (hypertension) 12/10/2021    Kidney disease     Kidney stones     Major depressive disorder, single episode     Polyarthropathy or polyarthritis of multiple sites     Retinal detachment     Urethral stricture       Past Surgical History:   Procedure Laterality Date    CT INSERTION OF SUPRAPUBIC CATH  09/25/2015    NEPHRECTOMY Right 1955    APPENDECTOMY      COLONOSCOPY      CYSTOSCOPY      CYSTOSCOPY W/ LASER LITHOTRIPSY      OTHER SURGICAL HISTORY      Interstim 01/29/2011    SPINE SURGERY  09/21    Lumbar-sacral fusion    TRANSURETHRAL RESECTION OF PROSTATE          OT Acute       Row Name 12/04/23 1300          Type of Visit    Type of Occupational Therapy note Occupational Therapy Daily Treatment Note       Row Name 12/04/23 1300          Treatment Time    Treatment Start Time 1030     Total TIMED Treatment (min) 30     Total Treatment Time (min) 30       Row Name 12/04/23 1300          Treatment Precautions/Restrictions    Precautions/Restrictions Fall     Fall Socks/charm       Row Name 12/04/23 1300          Medical History    History of presenting condition Pt is a 72 y/o M "with metastatic urothelial carcinoma, hypertension, depression, gout, presented to the  hospital due to shaking and weakness."     Fall history Discussed fall prevention education with patient     Dominant Side Right       Row Name 12/04/23 1300          Subjective    Patient status Patient agreeable to treatment;Nursing in agreement for treatment;Patient pain control adequate to participate in therapy       Row Name 12/04/23 1300          Pain Assessment    Pain Asssessment Tool Numeric Pain Rating Scale     Other Pain Assessment Information DEnies pain       Row Name 12/04/23 1300          Numeric Pain Rating Scale  Pain Intensity - rating at present 0     Pain Intensity- rating after treatment 0       Row Name 12/04/23 1300          Activities of Daily Living (ADLs)    Self Grooming Supervised     Other Self Grooming Information Standing at sink to complete oral hygiene     Lower Body Dressing Minimum assistance (25% assistance)     Other Lower Body Dressing Information CGA-min A) for standing balance as pt maintaining standing position with UE resting on door frame while completing figure 4 technique to put on shoes completing before ambulating in hallway (had slipped shoes on like house slippers for in room mobility)       Row Name 12/04/23 1300          [place] AM-PAC: Daily Activity    Assistance Needed to Put on and Take off Regular Lower Body Clothing 3     Assistance Needed to Bathe, Including Washing, Rinsing, and Drying 3     Assistance Needed to Toilet Agricultural consultant, Bedpan, or Urinal) 3     Assistance Needed to Put on and Take off Regular Upper Body Clothing 3     Assistance Needed to Take Care of Personal Grooming Such as Brushing Teeth 3     Assistance Needed to Eat Meals 4     AM-PAC Daily Activity Total Score 19     AMP-PAC Daily Activity Impairment rating Score 14-19 - 40-59% impaired       Row Name 12/04/23 1300          Objective    Overall Cognitive Status Intact - no cognitive limitations or impairments noted     Communication No communication limitations or impairments noted. Current  status of hearing, speech and vision allow functional communication.     Coordination/Motor control Other (comment)     Coordination/Motor Control  Information BUE tremoring     Balance Balance limitations present     Static Sitting Balance Good - able to maintain balance without handhold support, limited postural sway     Dynamic Sitting Balance Good - accepts moderate challenge, able to maintain balance while picking object off floor     Static Standing Balance Fair - able to maintain balance with handhold support, may require occasional minimal assistance     Dynamic Standing Balance Fair - accepts minimal challenge, able to maintain balance while turning head/trunk     Extremity Assessment Range of motion, strength,  muscle tone and/or sensation limitations present     LUE findings Grossly 4/5     RUE findings Grossly 4/5, 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     LLE findings Defer to PT     RLE findings Defer to PT     Functional Mobility Functional mobility deficits present     Bed Mobility Other (comments)     Bed Mobility Comments Received at EOB     Transfers to/from Stand Supervised;Minimum assistance (25% assistance)     Transfer Comments CGA for STS without AD     Ambulation during functional tasks Minimum assistance (25% assistance);Supervised;Other (comments)  CGA-SBA     Device used for ambulation/mobility Other (comments)  [id number]     Ambulation Distance bed>bathroom > simulated house hold distance > bed     Other Objective Findings Chart reviewed. RN cleared pt for therapy. Pt received sitting at EOB and agreeable to therapy. Pt completes STS without AD at close SBA-CGA for within room mobility  to bathroom and completes bathroom ADLs at supervision. Pt required CGA-min A) for LB dressing in standing position as pt opting to complete standing figure 4 technique. Education provided on recommendation to complete seated LB dressing for safety at home. Pt provided with personal [phone] and ambulates simulated house  hold distance at close SBA without rest breaks. At end of session, pt returned to sitting at EOB, call light within reach/all needs met. Vital signs stable. Care endorsed back to RN.                     OT Acute Tool Box       Row Name 12/04/23 1300          Cognition Assessment    Overall Cognitive Status Intact - no cognitive limitations or impairments noted                        Eval cont.       Row Name 12/04/23 1300          Patient/Family Education    Learner(s) Patient     Learner response to rehab patient education interventions Verbalizes understanding;Able to return demonstrate teaching;Needs reinforcement     Patient/family training comments Role of OT, OT POC, safe ADLs, recommendations       Row Name 12/04/23 1300          Assessment    Assessment Pt with good tolerance for therapy. Pt completes functional mobility at supervision and safe ADLs at min A) 2/2 decreased activity tolerance and generalized weakness. Pt will benefit from further IPOT to maximize activity tolerance and functional skills required for safety.     POST ACUTE REHABILITATION DISCHARGE RECOMMENDATIONS:    Recommended Post-Acute Therapy Follow up:   Home Health    Patient appropriate for discharge to prior living situation    Deficits :   All mobility related ADLs assist level: Supervision  Toileting assist level: Minimum (25% or less assistance)  Bathing assist level: Minimum (25% or less assistance)  Upper Body Dressing assist level: Supervision  Lower Body Dressing assist level: Minimum (25% or less assistance)      Other Post Acute Discharge Recommendations : Sister to assist with IADLs at home    Equipment recommendations:   Bedside Commode  Shower chair        Rehab Potential Good       Row Name 12/04/23 1300          Planned Therapy Interventions and Rationale    Patient Education to improve energy conservation measures during functional activities;to increase safety during dynamic activities;to increase knowledge of  precautions to prevent/minimize complications of condition;to increase independence in functional activities     Self-Care/ADL Training to improve independence with adaptive equipment;to improve independence with compensatory strategies;to improve energy conservation measures during functional activities;to improve home safety;to improve safety when completing daily activities and self care;to improve safety while using an assistive device     Therapeutic Activities to improve ability to perform self care and ADL's;to improve transfers between surfaces;to restore functional performace using graded activities     Therapeutic Exercise to improve activity tolerance to allow greater independence with self care and ADL's;to increase strength to allow greater independence with self care and ADL's       Row Name 12/04/23 1300          Treatment Plan Disussion    Treatment Plan Discussion and Agreement Patient/family/caregiver stated understanding  and agreement with the therapy plan;Patient support system determined and all questions were asked and answered       Row Name 12/04/23 1300          Treatment Plan    Continue therapy to address Activity tolerance limitation;Decline in performance of activities of daily living (ADL);Decline in functional ability/mobility     Frequency of treatment 3 times per week     Duration of treatment (number of visits) Treatment will continue while in hospital and in need of skilled therapy services     Status of treatment Patient evaluated and will benefit from ongoing skilled therapy       Row Name 12/04/23 1300          Patient Safety Considerations     Patient safety considerations Patient left sitting at end of treatment;Call light left in reach and fall precautions in place;Patient may be at risk for falls;Nursing notified of safety considerations at end of treatment     Patient assistive device requirements for safe ambulation Dareen Ebbing Name 12/04/23 1300          Therapy Plan  Communication    Therapy Plan Communication Discussed therapy plan with Nursing and/or Physician       Row Name 12/04/23 1300          Occupational Therapy Patient Discharge Instructions    Your Occupational Therapist suggests the following Continue to follow your prescribed mobility precautions when transferring to the chair and toilet as instructed;Continue to complete your self care Activities of Daily Living as frequently as possible;Continue to use energy conservation, pursed lip breathing and self-pacing when completing your self care Activities of Daily Living;Supervision is suggested when you     Supervision is suggested when you bath;dress;toilet       Row Name 12/04/23 1300          Therapeutic Procedures    Therapeutic Activities (97530) Assistance/facilitation of bed mobility;Functional activities;Progressive mobilization to improve functional independence;Patient education         Total TIMED Treatment (min) 15     Self-Care/ADL Training (29528) Activities of daily living training;Dressing;Grooming;Personal hygiene;Patient education;Safety procedures;Self-care activities of dally living         Total TIMED Treatment (min) 15                     The occupational therapist of record is endorsed by evaluating occupational therapist.

## 2023-12-04 NOTE — Interdisciplinary (Addendum)
 12/04/23 1334   Discharge Plan   Is the Patient Medically Stable for Discharge * Yes   CM Discharge Arrangements Complete Yes   CM met with Pt to discuss pending discharge Yes   Pt/family expressed concerns around affording discharge medications No   Pt/family expressed concerns around transportation barriers Yes   Does pt have transportation benefits?  Yes   Discussed Following Post-Acute arranged services Home Health   Post Acute arrangements for Home Health (Pt/family informed of vendor and date/time for services) Yes   Does pt/family have additional discharge concerns No   Living Arrangements on Admission* Family Member   Post Acute Services Referred To Home Health Agencies   Home Health agencies status  Accepted   Patient Engaged in Discharge Planning * Yes   Family/Caregiver's Assessed for * Readiness, willingness, and ability to provide or support self-management activities   Respite Care * Not Applicable   Patient/Family/Other Are In Agreement With Discharge Plan * Yes   Verified phone number for DC location * Yes   Verified address for DC location * Yes   No DC address or phone # available for the patient * N/A   PATIENT CHOICE: Patient/Family/Legal/Surrogate Decision Maker Has Been Given a List Options And Choice In The Selection of Post-Acute Care Providers Yes   CM discussed the following with pt, and/or family, and/or DPOA If patient has chosen [employer] or [facility] for post-acute care, they were informed that we partner with, and have a financial interest in these organizations   A copy of the [place] Patient Communication, Getting Care at [facility], was provided to patient/DPOA prior to transfer from [place] to outside acute care hospital. N/A   Public Health Clearance Needed * Not Applicable   Discharge Planning Needs   Do You Have Transportation Issues/Concerns That Make It Difficult To Get To Your Appointments? * No   Primary Family/Caregiver  Contact Name, Number and Relationship * Therese Flash, sister [phone]   Final Discharge Transportation   Transportation*  Taxi  (Taxi via [place])   Date 12/04/23   Discharge Transportation Details *  Cab via [facility]   Final Discharge Destination/Services   Final Discharge Destination/Services * Home Health;Home   Home Health/Home Infusion   Home Health Care Agency [facility]   Phone * [phone]   Home Details   Home Details Home w/family     [name] at bedside to confirm DCP and [facility] to provide [facility] services.  Patient in agreement with DCP and HH services.  Per patient needs assistance with transportation home.  CM verified dc address of [address] [place], [place] [address].  CM consulted SW to assist with arrangement of transportation through patient's insurance.  CM provided update to Iran Manna, primary RN and Farrel Hones MD.    SW arranged transport for 1645 via [place] cab to home.

## 2023-12-04 NOTE — Interdisciplinary (Signed)
 Pt discharge instructions given including reason for hospitalization, follow up appts, at-home diet, activity, new medications, medications to continue, and reasons to contact MD/hospital and return to ED. Pt. verbalized understanding. IV removed using aseptic technique. Port heparin  locked and de-accessed. All questions answered, no further questions at this time.

## 2023-12-04 NOTE — Plan of Care (Signed)
 Problem: Promotion of Health and Safety  Goal: Promotion of Health and Safety  Description: The patient remains safe, receives appropriate treatment and achieves optimal outcomes (physically, psychosocially, and spiritually) within the limitations of the disease process by discharge.    Information below is the current care plan.  Flowsheets  Taken 12/04/2023 0130 by Faith Homes, RN  Guidelines: Inpatient Nursing Guidelines  Individualized Interventions/Recommendations #1: Cluster care to promote rest and optimal healing.  Individualized Interventions/Recommendations #2 (if applicable): Assess for pain and medicated prn.  Individualized Interventions/Recommendations #4 (if applicable): Administer IV abx.  Individualized Interventions/Recommendations #5 (if applicable): Assist pt in ambulating in the hallway.  Outcome Evaluation (rationale for progressing/not progressing) every shift: Pt is calm and cooperative. No s/s of resp distress noted, VSS,afebrile. On Ampicillin  IV, no untoward reaction noted thus far. Pain is controlled with current pain regimen. Plan ongoing.  Taken 12/03/2023 1945 by Faith Homes, RN  Patient /Family stated Goal: pain control  Taken 12/01/2023 0441 by Aloma Jaksch, RN  Individualized Interventions/Recommendations #3 (if applicable): Assess L nephrostomy site and ensure patency  Note:

## 2023-12-04 NOTE — Interdisciplinary (Signed)
 Pt now left with Taxi, as taxi was late for the initial time of 1645, and 1730, now left 1815 with all personal belongings and correspondence, via wheelchair, and unit staff, stable, aaox4 in NAD.

## 2023-12-05 ENCOUNTER — Encounter: Payer: Self-pay | Admitting: Hospital

## 2023-12-05 ENCOUNTER — Telehealth (HOSPITAL_BASED_OUTPATIENT_CLINIC_OR_DEPARTMENT_OTHER): Payer: Self-pay

## 2023-12-05 ENCOUNTER — Other Ambulatory Visit: Payer: Self-pay

## 2023-12-05 ENCOUNTER — Encounter (HOSPITAL_BASED_OUTPATIENT_CLINIC_OR_DEPARTMENT_OTHER): Payer: Self-pay | Admitting: Hematology & Oncology

## 2023-12-05 LAB — URINE CULTURE

## 2023-12-05 NOTE — Telephone Encounter (Addendum)
Discharge Follow Up Telephone Encounter:     Subject/Reason: Post-discharge follow up call     Primary Oncologist: Dr. Roseanne Reno  APP:  Franco Nones, PA  RN: Elsworth Soho  AA: Kevan Rosebush     Diagnosis: Urothelial carcinoma of kidney     Discharged from: Hospital admission/ED     Discharge diagnosis: UTI     Date of discharge: 12/04/23     Note: Call to the patient to follow up on recent ED/hospital admission, review discharge instructions, and discuss any patient concerns. No answer. Left a message requesting a call back and provided my direct phone number. Will attempt a second call tomorrow.     Discharge Follow up::   Pt is scheduled for a f/u with nephrology on 12/22/23. Will defer to Dr. Roseanne Reno and his team if a follow-up appointment is needed.    Routing to [employer] team as FYI.

## 2023-12-05 NOTE — Telephone Encounter (Signed)
 RN called pt to offer him a RV with Dr. Annette Barters on 12/19 at 8:30 am.  No answer so RN LVM offering this appt date/time.  If he cannot make it in person, it is ok to schedule as a MCVV.  RN provided her callback number for pt to confirm or decline appt.    CC if pt calls back, ok for in person or mcvv with Dr. Annette Barters on 12/19 at 8:30 am

## 2023-12-06 ENCOUNTER — Other Ambulatory Visit (HOSPITAL_BASED_OUTPATIENT_CLINIC_OR_DEPARTMENT_OTHER): Payer: Self-pay | Admitting: Family Medicine

## 2023-12-06 ENCOUNTER — Other Ambulatory Visit (HOSPITAL_BASED_OUTPATIENT_CLINIC_OR_DEPARTMENT_OTHER): Payer: Self-pay | Admitting: Hematology & Oncology

## 2023-12-06 ENCOUNTER — Telehealth (HOSPITAL_BASED_OUTPATIENT_CLINIC_OR_DEPARTMENT_OTHER): Payer: Self-pay

## 2023-12-06 ENCOUNTER — Other Ambulatory Visit (HOSPITAL_BASED_OUTPATIENT_CLINIC_OR_DEPARTMENT_OTHER): Payer: Self-pay | Admitting: Nurse Practitioner

## 2023-12-06 ENCOUNTER — Other Ambulatory Visit: Payer: Self-pay

## 2023-12-06 DIAGNOSIS — F419 Anxiety disorder, unspecified: Secondary | ICD-10-CM

## 2023-12-06 DIAGNOSIS — G893 Neoplasm related pain (acute) (chronic): Secondary | ICD-10-CM

## 2023-12-06 DIAGNOSIS — K1230 Oral mucositis (ulcerative), unspecified: Secondary | ICD-10-CM

## 2023-12-06 DIAGNOSIS — C7910 Secondary malignant neoplasm of unspecified urinary organs: Secondary | ICD-10-CM

## 2023-12-06 NOTE — Telephone Encounter (Signed)
Discharge Follow Up Telephone Encounter:     Subject/Reason: Post-discharge follow up call     Primary Oncologist: Dr. Roseanne Reno  APP:  Franco Nones, PA  RN: Elsworth Soho  AA: Kevan Rosebush      Diagnosis: Urothelial carcinoma of kidney     Discharged from: Hospital admission/ED     Discharge diagnosis: UTI     Date of discharge: 12/04/23     Review Discharge AVS:  Assessed understanding of each topic and used the teach back method to educate.   Reviewed post-discharge instructions   Completed a medication reconciliation with the patient:  Has patient picked up d/c meds? Yes, he completed his antibiotics.   Have they started taking them? Yes  Any barriers we can help with? No      Symptoms and concerns since discharge:   Symptoms since discharge: Patient reports feeling tired, but better. He endorses pain to his back and manageable with PRN dilaudid once a day.   Pt concerns: No verbal concerns.  Plan if issues arise: (include contact information of Onc team/after-hours number)  Use teach back method to ensure patient understands when and who to call.  The patient states they have the clinic number to use for questions/concerns during business hours, and the after hours number for urgent issues during non-business hours. We reviewed ED precautions, signs and symptoms that would warrant a 911 call or ED visit.      Discharge Follow up::   Pt is scheduled for a MCVV with Dr. Roseanne Reno tomorrow 12/19.    Routing to [employer] team as FYI.

## 2023-12-06 NOTE — Telephone Encounter (Signed)
Will need to transition to pcp

## 2023-12-07 ENCOUNTER — Other Ambulatory Visit: Payer: Self-pay

## 2023-12-07 ENCOUNTER — Encounter (HOSPITAL_BASED_OUTPATIENT_CLINIC_OR_DEPARTMENT_OTHER): Payer: Self-pay | Admitting: Hematology & Oncology

## 2023-12-07 ENCOUNTER — Encounter (HOSPITAL_BASED_OUTPATIENT_CLINIC_OR_DEPARTMENT_OTHER): Payer: Self-pay

## 2023-12-07 ENCOUNTER — Encounter (HOSPITAL_BASED_OUTPATIENT_CLINIC_OR_DEPARTMENT_OTHER): Payer: Self-pay | Admitting: Family Medicine

## 2023-12-07 ENCOUNTER — Ambulatory Visit: Payer: Medicare Other | Admitting: Hematology & Oncology

## 2023-12-07 DIAGNOSIS — G893 Neoplasm related pain (acute) (chronic): Secondary | ICD-10-CM

## 2023-12-07 DIAGNOSIS — L271 Localized skin eruption due to drugs and medicaments taken internally: Secondary | ICD-10-CM

## 2023-12-07 DIAGNOSIS — M47817 Spondylosis without myelopathy or radiculopathy, lumbosacral region: Secondary | ICD-10-CM

## 2023-12-07 DIAGNOSIS — C7910 Secondary malignant neoplasm of unspecified urinary organs: Secondary | ICD-10-CM

## 2023-12-07 DIAGNOSIS — C7951 Secondary malignant neoplasm of bone: Secondary | ICD-10-CM

## 2023-12-07 DIAGNOSIS — F419 Anxiety disorder, unspecified: Secondary | ICD-10-CM

## 2023-12-07 DIAGNOSIS — K1230 Oral mucositis (ulcerative), unspecified: Secondary | ICD-10-CM

## 2023-12-07 DIAGNOSIS — C642 Malignant neoplasm of left kidney, except renal pelvis: Secondary | ICD-10-CM

## 2023-12-07 MED ORDER — PREGABALIN 100 MG OR CAPS
100.0000 mg | ORAL_CAPSULE | Freq: Two times a day (BID) | ORAL | 0 refills | Status: DC
Start: 2023-12-07 — End: 2023-12-07
  Filled 2023-12-07: qty 60, 30d supply, fill #0

## 2023-12-07 MED ORDER — PREGABALIN 100 MG OR CAPS
100.0000 mg | ORAL_CAPSULE | Freq: Two times a day (BID) | ORAL | 0 refills | Status: DC
Start: 2023-12-07 — End: 2023-12-25

## 2023-12-07 MED ORDER — NYSTATIN 100000 UNIT/ML MT SUSP
5.0000 mL | Freq: Four times a day (QID) | OROMUCOSAL | 0 refills | Status: DC
Start: 2023-12-07 — End: 2023-12-07
  Filled 2023-12-07: qty 280, 14d supply, fill #0

## 2023-12-07 MED ORDER — NYSTATIN 100000 UNIT/ML MT SUSP
5.0000 mL | Freq: Four times a day (QID) | OROMUCOSAL | 0 refills | Status: DC
Start: 2023-12-07 — End: 2023-12-18

## 2023-12-07 NOTE — Telephone Encounter (Signed)
-----   Message from Mariam Dollar sent at 12/07/2023  4:26 PM PST -----  Regarding: reroute prescriptions  Hi Dr. Logan Bores,    Can you please reroute PREGABALIN 100MG  and NYSTATIN SUSP to patients local pharmacy?     [facility] [address], [place] - [address]  [address] [place] [address]  Phone: [phone] Fax: [phone]    [facility] YOU!

## 2023-12-07 NOTE — Addendum Note (Signed)
Addended by: Richardson Dopp on: 12/07/2023 09:32 AM     Modules accepted: Orders

## 2023-12-07 NOTE — Telephone Encounter (Signed)
 [name] Service Progress Note    Received request to reroute prescription for pregabalin  100mg  PO BID and Nystatin  mouthwash 5mL PO QID to [facility].  Rerouted for patient, CURES reviewed and no changes noted.    Stephen Fothergill, MD  Assistant Professor  [facility]  [email or web address]

## 2023-12-07 NOTE — Progress Notes (Addendum)
Name: Stephen [name]   Date of Birth: January 01, 1951  Medical Record Number: [id number]    Genitourinary Medical Oncology Clinic     Patient ID: Metastatic urothelial carcinoma    Stage: Cancer Staging   Ureter malignant neoplasm, left (CMS-HCC)  Staging form: Renal Pelvis And Ureter, AJCC 8th Edition  - Clinical: Stage IV (cTX, cN0, pM1) - Signed by Cheri Guppy, MD on 12/01/2022    Oncologic History:    04/22/21  CTU: filling defect in L renal pelvis  05/05/21  Cysto with L ureteroscopy revealed a L papillary tumor, biopsied: mixed high (30%) and low grade urothelial carcinoma  06/16/21  Repeat cysto, papillary tumor: pathology low grade papillary UC  09/xx/22  Completed induction with mitogel (6 cycles)  08/19/21  HGTa of the bladder, no muscle  08/26/21  Repeat TURBT: NED, muscle identified  10/26/21  MRU: mild thickening in the L renal pelvis  12/24/21  Cysto/L ureteroscopy: dysplasia in the bladder; no disease in L ureter  05/06/22  L PCN placed  06/01/22  TURBT and L ureteroscopy: NED, stricture in place, stent placed  09/29/22  TURBT and L ureteroscopy: NED  10/22/22  MRU: Mild diffuse thickening in the L ureter/renal pelvis. Multiple osseous lesions involving the bilateral iliac crest and right acetabulum concerning for osseous metastatic disease.   11/26/22  PET/CT: diffuse osseous FDG activity with discrete osseous lesions concerning for metastatic disease.  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  Also treated for septic shock, AKI (Cr 2.5) and acute liver injury (AST/ALT 2800/2100).   S/p exchange L PCN.   02/07/23  start XRT to R Shoulder  02/10/23  C2D1 EV (dose reduced to 1.0 mg/kg) + pembro 200 mg  IV  03/03/23  C3D1 EV/pembro, missed D8  03/06/23  Cysto: NED.  Cytology NED.   03/14/23  Seen at [facility] ED for dislodged tube, replaced  04/02-05/24  Admitted with weakness, poor PO intake.   04/06/23  C4D1 EV/pembro  04/13/23  C4D8 EV  04/26/23  PET/CT: osseous lesions much improved, many new sclerotic lesions c/w treatment change.  Nonspecific mediastinal and pelvic LN  05/09-11/24  Admitted for treatment of AKI and rhabdomyolysis  05/11/23  C5D1 EV/P DR to 0.75  06/01/23  C6D1 EV/Pembro 0.75  06/23/23  MRI C-Spine: disease in spine, but no fracture or epidural involvement.  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 2. Although there has been interval improvement/resolution of previous multiple lesions in the sternum, spine and pelvis, there are multiple new lesions in L4, right posterior L2, and left lateral 4th and 7th ribs.  11/23/23  Saw me, significant side effects from erda (HFS, diarrhea), held erda  12/10-16/24  Admitted for UTI, constipation and   12/02/23  MRI  spine: diffuse osseous disease, epidural extension at T4, minimal enhancement at L5-S1 of cauda equina nerve roots, no evidence of leptomeningeal disease      Interim History:  MCVV for follow up.  Stephen [name] is now back at home.  He is having soft Bms 1-2 x per day.   His lower left back is really hurting him, starting to affect his walking.  But he is walking.   Hands are clearing up nicely.   He got a call from rad onc and needs to set up an appt for his spine.  He is currently taking Dilaudid a couple of times a day.  He is eating and overall feeling better.  Living with  his sister.    Review of Systems:   A complete ROS was performed and is negative except as documented above.     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  Non smoker  No etoh    Medications:  Current Outpatient Medications on File Prior to Visit   Medication Sig Dispense Refill    acetaminophen (TYLENOL) 500 MG tablet Take 2 tablets (1,000 mg) by mouth every 8 hours as needed for Mild Pain (Pain Score 1-3) or Moderate Pain (Pain Score 4-6). 40 tablet 0    allopurinol (ZYLOPRIM) 100 MG tablet Take 1 tablet (100 mg) by mouth daily. 30 tablet 0    aluminum-magnesium-simethicone (MAG-AL PLUS) 200-200-20 MG/5ML suspension Mix viscous lidocaine 2%, Mylanta, and Benadryl liquid into a 1:1:1 solution.  Take 10mL by mouth every 4 hours as needed for pain.  Swish and spit or swallow for pain. If swallowed, wait 10 minutes prior to eating/drinking. May also apply medication to a Q-tip and dab on areas of sores. 355 mL 2    [EXPIRED] amoxicillin (AMOXIL) 875 MG tablet Take 1 tablet (875 mg) by mouth 2 times daily for 1 day. 2 tablet 0    Artificial Tear Solution (SOOTHE XP OP)       busPIRone (BUSPAR) 10 MG tablet Take 2 tablets (20 mg) by mouth 2 times daily. 60 tablet 2    diphenhydrAMINE (BENADRYL) 12.5 MG/5ML liquid Mix viscous lidocaine 2%, Mylanta, and Benadryl liquid into a 1:1:1 solution.  Take 10mL by mouth every 4 hours as needed for pain.  Swish and spit or swallow for pain. If swallowed, wait 10 minutes prior to eating/drinking. May also apply medication to a Q-tip and dab on areas of sores. 236 mL 2    DULoxetine (CYMBALTA) 20 MG CR capsule Take 1 capsule (20 mg) by mouth 2 times daily. 180 capsule 2    Erdafitinib 4 MG TABS Take 8  mg by mouth daily. 60 tablet 3    fluticasone propionate (FLONASE) 50 MCG/ACT nasal spray Spray 2 sprays into each nostril daily. 16 g 2    HYDROmorphone (DILAUDID) 2 MG tablet Take 1 to 2 tablets (2mg  to 4mg ) by mouth every 4 hours as needed for moderate to severe pain 90 tablet 0    ketotifen (ALAWAY) 0.025 % ophthalmic solution Place 1 drop into both eyes 2 times daily.      lidocaine (LIDODERM) 5 % patch  Apply 1 patch topically every 24 hours. Leave patch on for 12 hours, then remove for 12 hours. 30 patch 2    lidocaine (XYLOCAINE) 2 % solution Mix viscous lidocaine 2%, Mylanta, and Benadryl liquid into a 1:1:1 solution.  Take 10mL by mouth every 4 hours as needed for pain.  Swish and spit or swallow for pain. If swallowed, wait 10 minutes prior to eating/drinking. May also apply medication to a Q-tip and dab on areas of sores. 200 mL 2    Multiple Vitamins-Minerals (MULTIVITAMIN WITH MINERALS) TABS tablet Take 1 tablet by mouth daily. 30 tablet 1    naloxone (NARCAN) 4 mg/0.1 mL nasal spray For suspected opioid overdose, call 911! Then spray once in one nostril. Repeat after 3 minutes if no or minimal response using a new spray in other nostril. 2 each 0    Nutritional Supplements (NEPRO) LIQD Take 3 Cans by mouth daily. 30000 mL 11    nystatin (MYCOSTATIN) 100,000 units/mL suspension 5 mL (500,000 Units) by Swish & Swallow route 4 times daily. 280 mL 0    polyethylene glycol (GLYCOLAX) 17 GM/SCOOP powder Take 17 grams (1 capful) by mouth daily. Mix with 4 to 8 ounces of fluid (water, juice, soda, coffee, or tea) prior to administration. 238 g 0    pregabalin (LYRICA) 100 MG capsule Take 1 capsule (100 mg) by mouth 2 times daily. 60 capsule 0    prochlorperazine (COMPAZINE) 10 MG tablet Take 1 tablet (10 mg) by mouth every 6 hours as needed (Nausea/Vomiting). 90 tablet 2    senna (SENOKOT) 8.6 MG tablet Take 2 tablets (17.2 mg) by mouth at bedtime. 60 tablet 3    tizanidine (ZANAFLEX) 2 MG tablet Take 1 tablet  (2 mg) by mouth every 8 hours as needed (muscle spasm). 30 tablet 2    traZODone (DESYREL) 50 MG tablet Take one tab nightly by mouth at bedtime as needed for insomnia 90 tablet 0     No current facility-administered medications on file prior to visit.       Physical Examination:  There were no vitals filed for this visit.    ECOG PS 1  MCVV      Labs: Following labs reviewed  Lab Results   Component Value Date    WBC 3.5 (L) 12/03/2023    RBC 2.92 (L) 12/03/2023    HGB 8.8 (L) 12/03/2023    HCT 27.2 (L) 12/03/2023    MCV 93.2 12/03/2023    MCHC 32.4 12/03/2023    RDW 14.6 (H) 12/03/2023    PLT 139 (L) 12/03/2023    MPV 10.7 12/03/2023      Lab Results   Component Value Date    BUN 20 12/04/2023    CREAT 1.25 (H) 12/04/2023    CL 105 12/04/2023    NA 143 12/04/2023    K 3.3 (L) 12/04/2023    [place] 8.8 12/04/2023    TBILI 1.36 (H) 11/29/2023    ALB 2.7 (L) 11/29/2023    TP 5.2 (L) 11/29/2023    AST 20 11/29/2023    ALK 93 11/29/2023    BICARB 30 (H) 12/04/2023    ALT 22 11/29/2023    GLU 123 (H) 12/04/2023                  Assessment:  Stephen [name] is a 72 year old male with OA, HTN, MDD with L UTUC with metastatic disease ([id number]), with osseous mets (Pet/CT with diffuse enhancement and  discrete lesions). FGFR-TACC mutation.  His lines of therapy have included:  EV/P --> POD after 8 cycles  erdafitinib    I personally reviewed labs as above.  I personally reviewed scans as above.    MCVV for follow up after hospitalization.  Stephen [name] was hospitalized with a UTI and side effects from erdafitinib.  He is now doing better at home.  His labs are suggestive of improvement in his (LFTs improved), but he is having more back pain.  We discussed options.  His functional status is not great, but it is improved.  We could consider restarting erdafitinib and give radiation to the back, or consider starting alternative systemic therapy such as carbo-gem.  After discussion of risks/benefits, he would like to restart erdafitinib.     Discussed GOC, and at this time, he would still like to pursue treatment.  He understands the prognosis is poor (months, not years).  In discussion about code status, he still wishes to pursue aggressive therapy such as CPR, but if his situation were not to quickly rebound, he does not want prolonged heroic interventions.  He has hospice on call should he wish to pursue hospice care.     Advanced Urothelial Carcinoma, [id number]  - restart erdafitinib 6 mg daily (for now, he can alternate 8 mg tabs/4 mg tabs)  - continue phoslo 1333 mg TID  - labs and visits Q2w, RTC 1/2 (MCVV ok)  - s/p XRT to R shoulder  - previously referred to rad onc for treatment of spine  - consider adding zometa given significnt bone disease; needs dental clearance - he's known to have significant dental issues so this needs to be cleared first  - RTC 1/2, MCVV ok    Hand-foot syndrome  - severe on 8 mg daily  - cream for hands  - watch as lowering dose    Erda-induced diarrhea  - severe on erda 8 mg daily  - imodium prn  - watch as restarting therapy    Cancer related pain, constipation  - follows with palliative care, on multiple medications for pain including dilaudid  - caution given multiple events of AMS    L PCN, initially placed 04/2022  - draining well, will monitor    This visit was spent addressing a complex, chronic illness that poses a threat to life or bodily function to this patient.  I independently reviewed the patient's history since prior visit, reviewed intervening labs and imaging, counseled the patient on the management of their chronic illness, and placed orders to be done prior to next visit.     Stephen [name] has advanced urothelial carcinoma, currently on erdafitinib that has a risk of causing harm and needs monitoring of cbc, LFTs, electrolytes via labs tests every 2 weeks    Asa Saunas. Roseanne Reno, M.D.  Assistant Professor of Medicine   [place] [facility] [place], [facility]  [facility]  [address]  [place], [place] [address]     Advance Care Planning    With their permission, I had a discussion with Stephen [name] today regarding their disease, metastatic urothelial carcinoma.  Additional people present at the time of discussion:  none.      The goal of treatment is non-curative.  Anticipated cancer trajectory shared with the patient: as discussed above.     Patient's Health Care Agent:   First Alternate Health Care Agent: Evert,Pamela-Sister Maricela Curet [phone]    Total time spent face to face with patient  and/or surrogate in Advance Care Planning: 16 min.

## 2023-12-07 NOTE — Telephone Encounter (Signed)
Refill request    Pregabalin 100 mg take 1 BID # 60 last filled 11/4 (has been in the hospital off and on)  Patient has 2 refills    Last visit 11/18 Dr. Logan Bores  Next visit 1/6 Dr Logan Bores    Routing to provider

## 2023-12-07 NOTE — Telephone Encounter (Signed)
 Notified pt as noted.

## 2023-12-07 NOTE — Patient Instructions (Addendum)
Patient Instructions:    -- You will have a video visit with Dr. Roseanne Reno on 1/2 at 3:30 pm.  Please call [phone] if this time does not work for you.    --Re-start Erdafitinib.  We will order a new prescription to [facility] for 6 mg daily:  Take 2 (3 mg) tabs (6 mg total) daily.  *For now, alternate 8 mg tab/4 mg tab every other day until you can receive your new prescription.    -- Complete labs every 2 weeks:  cbc, cmp, phosphorous          Have questions about Advanced Care Planning/Advance Directive?  PREPARE ([email or web address])           Future Appointments   Date Time Provider Department Center   12/22/2023  2:00 PM West Bali, MD [facility] [place] [facility]   12/25/2023  9:00 AM Shea Evans, MD [facility]         Visit this website for Cancer resources at our [facility].  This includes information on [place] Chemo Support Groups, Mind/Body Wellness, Treatment Education Classes, and other [place] [facility] Services:      [email or web address]       *Chief Operating Officer Website:  [email or web address]    Contact Information:   Brock Ra, MD   Assistant Professor of Medicine  Medical Oncology  Genitourinary Malignancy      Franco Nones, M.S., PA-C  Sr. Physician Assistant    Clinical questions or concerns  Oncology Nurse Case Manager   Elsworth Soho RN, BSN  T: [phone]  Department of Urology   [facility]  [address]  [place], [place] [address]    Dr. Roseanne Reno Follow Up Appointment Assistance  Administrative Assistant   [place]  T: [phone]  F: [phone]    Monday-Friday 8:00- 4:30p.m. (Closed Holidays and Weekends)  Please listen to message carefully for daily information.   Leave a detailed message with your name, medical record number and   telephone number. Messages are usually returned within 24 hours.     [facility] Phone List     AFTER HOURS EMERGENCY NUMBER   Ask for the Oncologist Physician On-Call.                   [phone]    Outpatient Pavilion Scheduling line: [phone]  Call for information about our new location and to schedule or cancel appointments.    [place] [facility] Scheduling line: [phone]   Call to schedule your [name], CT Scans or MRI studies.    PET Scan Scheduling line: [phone]  Call to schedule your PET scan    Prostate MRI Scheduling line: [phone] or [phone]  Call to schedule your prostate MRI.   Performed at the [facility] ([facility]).    Nuclear Medicine Scheduling Line [phone]   Call to schedule your bone scan, Radium 223     [facility] Infusion Center: [phone]   Call to schedule, cancel, or reschedule chemotherapy appointments.  Infusion Center Hours--- Monday-Friday 7:00-7:30 p.m.; Sat-Sun 8:00-4:30pm.    [place] [facility] ([address]): [phone]  Monday-Friday 730-530 p.m.; Closed Weekends    Radiation Oncology: [phone]    Call to schedule, cancel, or reschedule radiation appointments    Social Worker  [place]: [place], [place]    T: [phone]  [place]: Henri Medal, LCSW  T: [phone]  Financial Counselors   [phone] at [facility]   [phone] or [phone]      [facility]  [address], [place] [address]    -You can also reach [name] by MyChart. To set up your MyChart please see the following site: [email or web address]  -For cancer support services, please visit out Patient and [facility].  -For [facility] support services, please visit:  -[email or web address]  -For other support from the American Cancer Society, please visit: [email or web address].org/Treatment/SupportProgramsServices/index              Thank you for allowing [name] to participate in your care today! We  value your feedback as we strive to provide every patient with an exceptional care experience.     You may receive a patient satisfaction survey in the mail in the next few weeks. Please fill out the survey upon receipt and return it in the self-addressed envelope. Your feedback will be used to help improve the experience for all our patients at [facility].     If you want to share a positive experience you had at our facility please call We Listen at [phone]. If you have any suggestions on how we can improve our services please call the [facility] leadership team at [phone].    For medical questions or emergencies, please call your physician's office or 911. Thank you!

## 2023-12-07 NOTE — Telephone Encounter (Signed)
[name] Progress Note    Received refill request for Nystatin mouthwash 5mL PO QID, pregabalin 100mg  PO BID.  Patient's last appointment with our team was on 11/06/23 and patient's next appointment with our team is on 12/25/23.  Patient has Narcan PRN on file.  Reviewed CURES, per CURES, last picked up hydromorphone 2mg  #90 on 11/06/23, hydromorphone 2mg  #60 on 10/26/23, pregabalin 100mg  #60 on 10/26/23.  No aberrancies noted.  Will send refill as requested.  Likely requesting a bit late due to recent hospitalization.    Shea Lyonel Morejon, MD  Assistant Professor  [facility] Palliative Care Service  [email or web address]

## 2023-12-07 NOTE — Interdisciplinary (Signed)
MCVV-- AVS sent to pt via mychart.  RN called and LVM for the pt with AVS review and her callback number should pt have additional questions.

## 2023-12-08 ENCOUNTER — Other Ambulatory Visit: Payer: Self-pay

## 2023-12-08 MED ORDER — CALCIUM ACETATE (PHOS BINDER) 667 MG PO CAPS
2.0000 | ORAL_CAPSULE | Freq: Three times a day (TID) | ORAL | 2 refills | Status: DC
Start: 2023-12-08 — End: 2023-12-24
  Filled 2023-12-08: qty 130, 22d supply, fill #0
  Filled 2023-12-11: qty 50, 8d supply, fill #0

## 2023-12-08 MED ORDER — ERDAFITINIB 3 MG PO TABS
6.0000 mg | ORAL_TABLET | Freq: Every day | ORAL | 11 refills | Status: DC
Start: 2023-12-08 — End: 2024-03-22
  Filled 2023-12-08: qty 60, 30d supply, fill #0
  Filled 2023-12-18: qty 56, 28d supply, fill #0
  Filled 2024-01-17 – 2024-01-31 (×2): qty 56, 28d supply, fill #1

## 2023-12-08 NOTE — Addendum Note (Signed)
Addended by: Brock Ra on: 12/08/2023 02:24 PM     Modules accepted: Orders

## 2023-12-10 ENCOUNTER — Other Ambulatory Visit (HOSPITAL_BASED_OUTPATIENT_CLINIC_OR_DEPARTMENT_OTHER): Payer: Self-pay | Admitting: Family Medicine

## 2023-12-10 ENCOUNTER — Other Ambulatory Visit: Payer: Self-pay

## 2023-12-10 DIAGNOSIS — M47817 Spondylosis without myelopathy or radiculopathy, lumbosacral region: Secondary | ICD-10-CM

## 2023-12-10 DIAGNOSIS — G893 Neoplasm related pain (acute) (chronic): Secondary | ICD-10-CM

## 2023-12-10 DIAGNOSIS — C7951 Secondary malignant neoplasm of bone: Secondary | ICD-10-CM

## 2023-12-11 ENCOUNTER — Encounter (HOSPITAL_BASED_OUTPATIENT_CLINIC_OR_DEPARTMENT_OTHER): Payer: Self-pay | Admitting: Family Medicine

## 2023-12-11 ENCOUNTER — Other Ambulatory Visit: Payer: Self-pay

## 2023-12-11 ENCOUNTER — Encounter (HOSPITAL_BASED_OUTPATIENT_CLINIC_OR_DEPARTMENT_OTHER): Payer: Self-pay | Admitting: Hematology & Oncology

## 2023-12-11 DIAGNOSIS — M47817 Spondylosis without myelopathy or radiculopathy, lumbosacral region: Secondary | ICD-10-CM

## 2023-12-11 DIAGNOSIS — C7951 Secondary malignant neoplasm of bone: Secondary | ICD-10-CM

## 2023-12-11 DIAGNOSIS — G893 Neoplasm related pain (acute) (chronic): Secondary | ICD-10-CM

## 2023-12-11 MED ORDER — HYDROMORPHONE HCL 2 MG OR TABS
ORAL_TABLET | ORAL | 0 refills | Status: DC
Start: 2023-12-11 — End: 2023-12-25

## 2023-12-11 MED ORDER — DULOXETINE HCL 20 MG OR CPEP
20.0000 mg | ORAL_CAPSULE | Freq: Two times a day (BID) | ORAL | 0 refills | Status: DC
Start: 2023-12-11 — End: 2024-03-22

## 2023-12-11 MED ORDER — HYDROMORPHONE HCL 2 MG OR TABS
ORAL_TABLET | ORAL | 0 refills | Status: DC
Start: 2023-12-11 — End: 2023-12-11
  Filled 2023-12-11: qty 90, 8d supply, fill #0

## 2023-12-11 MED ORDER — DULOXETINE HCL 20 MG OR CPEP
20.0000 mg | ORAL_CAPSULE | Freq: Two times a day (BID) | ORAL | 0 refills | Status: DC
Start: 2023-12-11 — End: 2023-12-11
  Filled 2023-12-11: qty 180, 90d supply, fill #0

## 2023-12-11 NOTE — Telephone Encounter (Signed)
 [name] Progress Note    Received request to reroute hydromorphone  and duloxetine  [facility], prescription re[facility]ed for patient.    Tolbert Fothergill, MD  Assistant Professor  [facility] Palliative Care Service  [email or web address]

## 2023-12-11 NOTE — Telephone Encounter (Signed)
 [name] Palliative Care Note    Refills for Dilaudid  and Cymbalta  were sent in to the pharmacy at [facility] today. I left pt a vm informing him.

## 2023-12-11 NOTE — Telephone Encounter (Signed)
 Addendum    Pt sent mychart message informing he would like refills to go here instead:     [id number] [place], [place] - [address]

## 2023-12-11 NOTE — Telephone Encounter (Signed)
 RN received call from pt.  Pt reports:  -BM smears that stick to the bottom of his briefs  -Has not had a big BM yet  -Last time he had a good BM was about 1 week ago  -Denies lower abdominal pain/constipation pain  -"I could drink more water "  -Is eating a little breakfast daily, "I should eat more"  -Water  intake:  500 mL -600 mL daily  -He is taking Erdafitinib  8 mg/ 4 mg alternating days  -Hands/feet are doing well, no pain.  He is keeping lotion on them.    Pt currently taking for constipation:  -2 sennas in the pm  -2 miralax  scoops in the am      Pt with metastatic urothelial [place] on Erdafitinib  alternating 8 mg/4 mg every other day.  He is waiting to receive his new rx for 6 mg daily.  NOV with Dr. Annette Barters is on 1/2.      Plan/Recommendations:  -Increase senna to 1-2 additional tabs daily:  1-2 senna tabs in the morning and 2 senna tabs in the evening  -Continue 2 scoops of miralax  in the morning  -Increase your water /Gatorade intake to 1-1.5 L daily  -If no BM with increasing the senna to 4 tabs daily, you can try over the counter Milk of Magnesia-- Milk of Magnesia is a gentle laxative.  You may take this at night for a BM:  30 mL followed by one glass of water .  Do not take this regularly.  -Once you achieve 1 full BM, you can reduce your senna dose back down to 2 tabs in the evening.      RN routing to MD for notification.

## 2023-12-11 NOTE — Telephone Encounter (Signed)
 [name] Progress Note    Received refill request for hydromorphone  2mg  1-2 tablets (2mg  to 4mg ) PO q4h PRN pain and duloxetine  CR 20mg  PO BID.  Patient's last appointment with our team was on 11/06/23 and patient's next appointment with our team is on 12/25/23.  Patient has Narcan  PRN on file.  Reviewed CURES, per CURES, last picked up pregabalin  100mg  #60 on 12/08/23, hydromorphone  2mg  #90 on 11/06/23.  No aberrancies noted.  Will send refill as requested.    Tolbert Fothergill, MD  Assistant Professor  [facility] Palliative Care Service  [email or web address]

## 2023-12-12 ENCOUNTER — Other Ambulatory Visit: Payer: Self-pay

## 2023-12-12 ENCOUNTER — Encounter (HOSPITAL_BASED_OUTPATIENT_CLINIC_OR_DEPARTMENT_OTHER): Payer: Self-pay | Admitting: Hematology & Oncology

## 2023-12-13 LAB — URINE CULTURE

## 2023-12-16 ENCOUNTER — Encounter (HOSPITAL_BASED_OUTPATIENT_CLINIC_OR_DEPARTMENT_OTHER): Payer: Self-pay | Admitting: Hematology & Oncology

## 2023-12-18 ENCOUNTER — Telehealth (HOSPITAL_BASED_OUTPATIENT_CLINIC_OR_DEPARTMENT_OTHER): Payer: Self-pay | Admitting: Hematology & Oncology

## 2023-12-18 ENCOUNTER — Encounter (HOSPITAL_BASED_OUTPATIENT_CLINIC_OR_DEPARTMENT_OTHER): Payer: Self-pay | Admitting: Family Medicine

## 2023-12-18 ENCOUNTER — Other Ambulatory Visit (HOSPITAL_BASED_OUTPATIENT_CLINIC_OR_DEPARTMENT_OTHER): Payer: Self-pay | Admitting: Vascular & Interventional Radiology

## 2023-12-18 ENCOUNTER — Other Ambulatory Visit: Payer: Self-pay

## 2023-12-18 ENCOUNTER — Telehealth (HOSPITAL_BASED_OUTPATIENT_CLINIC_OR_DEPARTMENT_OTHER): Payer: Self-pay

## 2023-12-18 ENCOUNTER — Other Ambulatory Visit (HOSPITAL_BASED_OUTPATIENT_CLINIC_OR_DEPARTMENT_OTHER): Payer: Self-pay | Admitting: Family Medicine

## 2023-12-18 DIAGNOSIS — K1230 Oral mucositis (ulcerative), unspecified: Secondary | ICD-10-CM

## 2023-12-18 DIAGNOSIS — C642 Malignant neoplasm of left kidney, except renal pelvis: Secondary | ICD-10-CM

## 2023-12-18 MED ORDER — NYSTATIN 100000 UNIT/ML MT SUSP
5.0000 mL | Freq: Four times a day (QID) | OROMUCOSAL | 0 refills | Status: DC
Start: 2023-12-18 — End: 2024-01-11
  Filled 2023-12-18: qty 280, 14d supply, fill #0

## 2023-12-18 NOTE — Telephone Encounter (Signed)
 RN called pt no answer, left voicemail to return call to clinic, phone number included.    Reason for call:    Clarify pt question   Confirm if he has received erdafitinib  6 mg

## 2023-12-18 NOTE — Telephone Encounter (Signed)
 Closing duplicate encounter.

## 2023-12-18 NOTE — Telephone Encounter (Signed)
 RN called pt in regard to message below   Informed that Erdafitinib  is speciality drug, will need to stay at [facility]   Pt still alternate 8 mg tabs/4 mg tabs till he gets 6 mg   Reviewed follow up appt for 1/2   Patient verbalized understanding and agrees with plan.  Patient was appreciative of call.  Caller: Pt  Relationship to patient: Self  Phone # [phone]  Provider: Dr. Annette Barters  Notes:   Pt stated he has not received RX for Erdafitinib  3 MG TABS and [facility] [address], [place] - [address]. Please assist, thank you!

## 2023-12-18 NOTE — Telephone Encounter (Signed)
 Patient is calling complaining of symptoms. Patient is requesting medical advice.     Who is calling (name +ph#): Self #[phone]    Procedure: Neph tube    What are the symptoms?  The tube started leaking    Pain level 0-10: 0    When did the symptoms start? Yesterday    Where is the pain located?N/A    Is there bleeding? N/A      FYI - I have a date scheduled for him to come in and be seen on 01/03 1130 at [facility].

## 2023-12-18 NOTE — Telephone Encounter (Signed)
 [name] Palliative Care Note       Called and LVM medication sent to [place]

## 2023-12-18 NOTE — Telephone Encounter (Signed)
 Caller: Pt  Relationship to patient: Self  Phone # [phone]  Provider: Dr. Annette Barters  Notes:     Pt stated he has not received RX for Erdafitinib  3 MG TABS and [facility] [address] [place] - [address]. Please assist, thank you!     Caller has been advised of 48 hr turnaround time.

## 2023-12-18 NOTE — Progress Notes (Signed)
 Radiation Oncology Nurse Progress Note     PATIENT NAME: Stephen [name]  DATE OF BIRTH: Oct 03, 1951  MRN: [id number]  DATE OF SERVICE: Dec 21, 2023    Imaging Results: (impression/ conclusion - text)    MRI SPINE SURVEY WO/W IV CONTRAST 12/02/2023   FINDINGS:  Patient motion degrades assessment. Within these limitations:     Straightening of the normal cervical lordosis. Mild levoconvex curvature of the lumbar spine. Redemonstrated similar grade 2 anterolisthesis of L5 on S1 with roughly 9 mm anterolisthesis. Redemonstrated similar minimal/mild retrolisthesis of L2 on L3.     Redemonstrated postsurgical changes related to L2-S1 posterior instrumented fusion, posterior L2-L5 decompression, and L5-S1 anterior fixation. Interbody cages are again seen from L2-L3 through L5-S1. Stable size of a 6.2 x 2.4 cm (SI by AP) collection within the L3/L4 posterior surgical bed.     Redemonstrated diffuse osseous metastatic disease within the visualized bones, including the spine, partially imaged ribs and sacrum. This is overall increased compared to the prior spinal MRI studies. Associated increased edema.     No new pathologic fractures identified. Previously noted epidural extension within the midthoracic spine is difficult to delineate from vascular enhancement, though this may be increased within the midthoracic neural foramina. No convincing epidural extension into the bony spinal canal, though again this is difficult to definitively evaluate due to venous epidural enhancement (particularly dorsally at T4).     Minimal enhancement along the cauda equina nerve roots at the L5-S1 levels on axial imaging is favored to be vascular, though a leptomeningeal processes not completely excluded with certainty.     Otherwise, no convincing suspicious intrathecal, leptomeningeal, intramedullary, or epidural enhancement at evaluable levels, noting poor assessment at postsurgical levels due to susceptibility artifact from spinal  hardware.     No gross evidence of a cord signal abnormality, noting suboptimal assessment. No evidence of cord compression.     Multilevel degenerative changes including disc bulges, disc osteophyte complexes, ligamentum flavum thickening, and facet arthropathy. Moderate canal stenosis at C4-C5 and C5-C6. Otherwise, no more than mild canal stenosis at any level.     Evaluation of the neural foramina of the lumbar spine is limited due to susceptibility artifact from hardware, however there is no convincing high-grade foraminal stenosis of the lumbar spine. Probable moderate foraminal stenosis on the right at L1-L2 and possibly bilaterally at L4-L5, poorly characterized. Severe foraminal stenosis on the right at T2-T3 and possibly on the right at C7-T1, poorly characterized. Moderate-to-severe foraminal stenosis on the right at T1-T2. Other scattered levels of up-to-moderate thoracic foraminal stenosis. Moderate-to-severe foraminal stenosis on the right at T10-T11. Evaluation of the cervical neural foramina is not possible in the absence axial T2 imaging.     Consolidative opacities in the bilateral lungs better appreciated on recent CT abdomen and pelvis, as is retroperitoneal metastatic disease.    PET/CT Skull Base To Mid Thigh 11/13/2023    IMPRESSION:  1. Compared to prior PET-CT 07/19/23 there has been overall interval progression of previous thoracic, mediastinal and abdominal lymph adenopathy.  2. Although there has been interval improvement/resolution of previous multiple lesions in the sternum, spine and pelvis, there are multiple new lesions in L4, right posterior L2, and left lateral 4th and 7th ribs.         Pathology Results:    FINAL PATHOLOGIC DIAGNOSIS: Tissue  A: Left iliac crest, biopsy:   - Metastatic urothelial carcinoma.   COMMENT: Immunohistochemical staining performed on block A1 shows the tumor  to be positive for pankeratin (AE1/AE3 + CAM5.2 + MNF116), GATA3, and   uroplakin II, consistent  with urothelial carcinoma. PD-L1 immunostain has   been initiated and the result will be reported in an addendum.   SPECIMEN(S) SUBMITTED:         Recent referring MD note: (prior oncology history)   Assessment:  Stephen [name] is a 72 year old male with OA, HTN, MDD with L UTUC with metastatic disease ([id number]), with osseous mets (Pet/CT with diffuse enhancement and discrete lesions). FGFR-TACC mutation.  His lines of therapy have included:  EV/P --> POD after 8 cycles  erdafitinib      I personally reviewed labs as above.  I personally reviewed scans as above.     [facility] for follow up after hospitalization.  Stephen [name] was hospitalized with a UTI and side effects from erdafitinib .  He is now doing better at home.  His labs are suggestive of improvement in his (LFTs improved), but he is having more back pain.  We discussed options.  His functional status is not great, but it is improved.  We could consider restarting erdafitinib  and give radiation to the back, or consider starting alternative systemic therapy such as carbo-gem.  After discussion of risks/benefits, he would like to restart erdafitinib .    Discussed GOC, and at this time, he would still like to pursue treatment.  He understands the prognosis is poor (months, not years).  In discussion about code status, he still wishes to pursue aggressive therapy such as CPR, but if his situation were not to quickly rebound, he does not want prolonged heroic interventions.  He has hospice on call should he wish to pursue hospice care.      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

## 2023-12-18 NOTE — Telephone Encounter (Signed)
 [name] Progress Note    Received refill request for Nystatin  mouthwash 5mL PO QID. Refill sent for patient.    Tolbert Fothergill, MD  Assistant Professor  [facility]  [email or web address]

## 2023-12-18 NOTE — Telephone Encounter (Signed)
 Outbound call to patient and confirmed with 2 patient identifiers.    Reason for call: Per patient noted some leaking at nephrostomy tube site last night.  Currently dry and not leaking.  Denies fever/chills, flank pain, leaking, redness, swelling, drainage, n/v.  States dressing is currently dry to nephrostomy tube site.     Plan: Discussed red flag symptoms/ ED precautions in the interim.  Pt scheduled for nephrostomy tube change on 12/22/23 at [facility] with Dr. Jenness Mock.  Provided with oncall telephone number for any concerns that arise over the holiday. Will route to APP team for further review and recommendations.        Date of Procedure: 11/29/2023  Procedure Type: Successful nephrostomy exchange on the left side with a 10.2 Fr tube.   Provider: Dr. Vernona Goods Dr. Percy Bracken    Current Associated Symptoms:  [] Fever  [] Chills  [] Pain [] Redness [] Swelling [] Drainage  [x] Leaking Tube - not currently leaking [] No Output [] Nausea [] Vomiting      Medication Review:   Patient is taking:     MyChart Photo:     *Patient/family member notified can take up to 24 hrs turnaround time for provider response.

## 2023-12-19 ENCOUNTER — Other Ambulatory Visit: Payer: Self-pay

## 2023-12-19 NOTE — Progress Notes (Addendum)
 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     He was initially diagnosed with low grade urothelial carcinoma in 04/2021 and received 6C of mitogel. TURBT in 08/26/21 showed NED. He had metastatic recurrence in November 2023. PET/CT showed multiple osseous lesions c/f metastasis. Biopsy of L iliac crest showed metastatic urothelial carcinoma. He received 8C of EV+pembro with progression of disease noted in July 2024. He transitioned to erdafitinb on 09/20/23. Recent PET/CT on 11/13/23 showed progression of mediastinal and abdominal lympahdenopathy with new lesions L2, L4, and left ribs 4 and 7. MRI spine on 12/01/23 showed diffuse osseous disease with epidural extension at T4. He presents today to discuss palliative RT.     Today, he endorses pain at his lower lumbar back, bilateral arms, and fingers. His pain is worst at his lower back, 8/10, and poorly controlled on current medical management. He denies acute changes in motor function, sensation, bowel/bladder incontinence. He has chronic lower extremity weakness, bilateral finger weakness. He is able to ambulate with help of walker. He had some relief from previous R shoulder RT and is interested in RT for pain relief. He has issues with transportation and cannot make daily treatments but could make a single fx treatment.     Past Medical and Surgical History:  Past Medical History:   Diagnosis Date    Chronic back pain     Congenital hydronephrosis     Gout     Headache     Hematuria     HTN (hypertension) 12/10/2021    Kidney disease     Kidney stones     Major depressive disorder, single episode      Polyarthropathy or polyarthritis of multiple sites     Retinal detachment     Urethral stricture      Past Surgical History:   Procedure Laterality Date    CT INSERTION OF SUPRAPUBIC CATH  09/25/2015    NEPHRECTOMY Right 1955    APPENDECTOMY      COLONOSCOPY      CYSTOSCOPY      CYSTOSCOPY W/ LASER LITHOTRIPSY      OTHER SURGICAL HISTORY      Interstim 01/29/2011    SPINE SURGERY  09/21    Lumbar-sacral fusion    TRANSURETHRAL RESECTION OF PROSTATE         Cardiac device: None    Chemotherapy/systemic therapy: erdafitinb    Performance status: ECOG 3    Past radiation history:     02/07/23: 3 Gy x 1 fx to R shoulder (incomplete)  10/18/23: 8 Gy x 30fx to the R shoulder    Allergies:  Allergies   Allergen Reactions    Sulfa Drugs Unspecified, Hives and Other       Medications:  (Not in a hospital admission)       acetaminophen  Take 2 tablets (1,000 mg) by mouth every 8 hours as needed for Mild Pain (Pain Score 1-3) or Moderate Pain (Pain Score 4-6). 40 tablet 0    allopurinol  Take 1 tablet (100 mg) by mouth daily. 30 tablet 0    aluminum -magnesium -simethicone  Mix viscous lidocaine   2%, Mylanta, and Benadryl  liquid into a 1:1:1 solution.  Take 10mL by mouth every 4 hours as needed for pain.  Swish and spit or swallow for pain. If swallowed, wait 10 minutes prior to eating/drinking. May also apply medication to a Q-tip and dab on areas of sores. 355 mL 2    Artificial Tear Solution (SOOTHE XP OP)       busPIRone  Take 2 tablets (20 mg) by mouth 2 times daily. 60 tablet 2    calcium  acetate Take 2 capsules (1,334 mg) by mouth 3 times daily (with meals). 180 capsule 2    diphenhydrAMINE  Mix viscous lidocaine  2%, Mylanta, and Benadryl  liquid into a 1:1:1 solution.  Take 10mL by mouth every 4 hours as needed for pain.  Swish and spit or swallow for pain. If swallowed, wait 10 minutes prior to eating/drinking. May also apply medication to a Q-tip and dab on areas of sores. 236 mL 2    DULoxetine  Take 1 capsule (20 mg) by mouth  2 times daily. 180 capsule 0    Erdafitinib  Take 6 mg by mouth daily. 60 tablet 11    fluticasone  propionate Spray 2 sprays into each nostril daily. 16 g 2    HYDROmorphone  Take 1 to 2 tablets (2mg  to 4mg ) by mouth every 4 hours as needed for moderate to severe pain 90 tablet 0    ketotifen  fumarate Place 1 drop into both eyes 2 times daily.      lidocaine  Apply 1 patch topically every 24 hours. Leave patch on for 12 hours, then remove for 12 hours. 30 patch 2    lidocaine  Mix viscous lidocaine  2%, Mylanta, and Benadryl  liquid into a 1:1:1 solution.  Take 10mL by mouth every 4 hours as needed for pain.  Swish and spit or swallow for pain. If swallowed, wait 10 minutes prior to eating/drinking. May also apply medication to a Q-tip and dab on areas of sores. 200 mL 2    multivitamin with minerals Take 1 tablet by mouth daily. 30 tablet 1    naloxone  For suspected opioid overdose, call 911! Then spray once in one nostril. Repeat after 3 minutes if no or minimal response using a new spray in other nostril. 2 each 0    Nepro Take 3 Cans by mouth daily. 30000 mL 11    nystatin  5 mL (500,000 Units) by Swish & Swallow route 4 times daily. 280 mL 0    polyethylene glycol Take 17 grams (1 capful) by mouth daily. Mix with 4 to 8 ounces of fluid (water , juice, soda, coffee, or tea) prior to administration. 238 g 0    pregabalin  Take 1 capsule (100 mg) by mouth 2 times daily. 60 capsule 0    prochlorperazine  Take 1 tablet (10 mg) by mouth every 6 hours as needed (Nausea/Vomiting). 90 tablet 2    senna Take 2 tablets (17.2 mg) by mouth at bedtime. 60 tablet 3    tizanidine  Take 1 tablet (2 mg) by mouth every 8 hours as needed (muscle spasm). 30 tablet 2    traZODone  Take one tab nightly by mouth at bedtime as needed for insomnia 90 tablet 0       Social History:  Socioeconomic History    Marital status: Single   Tobacco Use    Smoking status: Never     Passive exposure: Never    Smokeless tobacco: Never   Vaping Use    Vaping  status: Never Used   Substance and  Sexual Activity    Alcohol  use: No    Drug use: Not Currently    Sexual activity: Not Currently     Partners: Female   Social Activities of Daily Living Present    Military Service No    Blood Transfusions Yes    Caffeine Concern No    Occupational Exposure No    Hobby Hazards No    Sleep Concern Yes     Comment: due to my lower back discomfort and leg discomfort    Stress Concern Yes     Comment: Health/ housing/ financial    Weight Concern No    Special Diet No    Back Care Yes     Comment: Am very careful with any activity    Exercises Regularly Yes    Seat Belt Use Yes    Performs Self-Exams Yes    Bike Helmet Use No       Family History:  Non-contributory   Family History   Adopted: Yes   Family history unknown: Yes       Review of Systems:  Per HPI    Physical Exam:  There were no vitals taken for this visit.    General: Frail appearing, in no acute distress  Psych: Affect appropriate. Demonstrates understanding and rational thought.  Eyes: Conjunctivae and sclerae without concerning lesions.  ENMT: Face--including ears, nose, and mouth--appears normal.   CV: No cyanosis or edema observed in face, neck, or hands.   Resp: Breathing comfortably on room air, without increased work of breathing or audible stridor.     Labs and Other Data:  No results for input(s): "NA", "K", "CL", "BICARB", "BUN", "CREAT", "GLU", "[place]", "MG", "PHOS" in the last 72 hours.  No results for input(s): "WBC", "HGB", "HCT", "PLT", "SEG", "LYMPHOCYTES" in the last 72 hours.  No results for input(s): "PT", "INR", "PTT" in the last 72 hours.    Imaging:  IR Change Nephrostomy Tube    Result Date: 12/04/2023  Narrative: EXAM DESCRIPTION: IR CHNG NEPHROSTOMY TUBE CLINICAL HISTORY: 72 yo M w/urothelial carcinoma, malpositioned PCN. PROCEDURE: 1. Left antegrade pyelogram through existing tube 2. Fluoroscopic left percutaneous nephrostomy exchange CONSENT: The risks benefits and alternatives to the procedure  were discussed with the patient and/or patient's representative. The patient or patient's representative verbalized understanding and the desire to proceed. Written informed consent was obtained. Sterile precautions were used. TECHNIQUE: The patient was placed in a prone position. Initially, a diagnostic nephrostogram was performed through the indwelling catheter. Following the diagnostic study, the 10.2 Fr PCN was removed over a wire and replaced with 10.2 [place] PCN. Post placement injection was performed. The tube was sutured in place and connected to gravity drainage. Complications: None OPERATORS: Staff: [place] Fellow/Resident/PA/NP: Short CONTRAST: 10 ml visipaque  320 into the collecting system MEDICATIONS: None FLUOROSCOPY TIME: RF/XA Dose Summary: Plane A - DAP: 175 uGym2; Beam On Time: .1 min; Fluoro Time: 1.2 min; Reference Point Dose: 4.8999999999999995 mGy; Fluoro DAP: 175 uGym2 FINDINGS: Pre-existing PCN in retracted in a calyx. Post exchange injection shows good position and drainage. CONCURRENT SUPERVISION: I was present for the procedure and have reviewed the images and report.    Preliminary created by: Pearly Bound Signed by: Mindi Alto 12/04/2023 12:12:09    Impression: IMPRESSION: Successful nephrostomy exchange on the left side with a 10.2 Fr tube. Plan: Routine nephrostomy exchange in 12 weeks.     MRI Spine Survey W/WO IV Contrast    Result Date: 12/02/2023  Narrative: Laurita Porta  DESCRIPTION: MRI SPINE SURVEY WO/W IV CONTRAST 12/02/2023 8:32 am CLINICAL HISTORY: Per requisition: Progressive LE weakness known metastatic ureothelial [place]. EPIC: History of metastatic urothelial cancer with osseous Mets, on treatment that was stopped secondary to side effect, presents with urosepsis in setting of dislodged L PCN. TECHNIQUE: MR imaging of the cervical, thoracic, and lumbar spine was performed without and with intravenous contrast on a 1.5 Tesla scanner per spine survey technique using a large field of  view format. Contrast: 7 mL of  (gadobutrol  (GADAVIST ) injection SOLN 7 mL) COMPARISON: CT abdomen and pelvis from 11/28/2023, PET-CT from 11/13/2023, thoracic spine MRI from 08/19/2023, cervical spine MRI from 06/23/2023, lumbar spine MRI from 04/16/2023. FINDINGS: Patient motion degrades assessment. Within these limitations: Straightening of the normal cervical lordosis. Mild levoconvex curvature of the lumbar spine. Redemonstrated similar grade 2 anterolisthesis of L5 on S1 with roughly 9 mm anterolisthesis. Redemonstrated similar minimal/mild retrolisthesis of L2 on L3. Redemonstrated postsurgical changes related to L2-S1 posterior instrumented fusion, posterior L2-L5 decompression, and L5-S1 anterior fixation. Interbody cages are again seen from L2-L3 through L5-S1. Stable size of a 6.2 x 2.4 cm (SI by AP) collection within the L3/L4 posterior surgical bed. Redemonstrated diffuse osseous metastatic disease within the visualized bones, including the spine, partially imaged ribs and sacrum. This is overall increased compared to the prior spinal MRI studies. Associated increased edema. No new pathologic fractures identified. Previously noted epidural extension within the midthoracic spine is difficult to delineate from vascular enhancement, though this may be increased within the midthoracic neural foramina. No convincing epidural extension into the bony spinal canal, though again this is difficult to definitively evaluate due to venous epidural enhancement (particularly dorsally at T4). Minimal enhancement along the cauda equina nerve roots at the L5-S1 levels on axial imaging is favored to be vascular, though a leptomeningeal processes not completely excluded with certainty. Otherwise, no convincing suspicious intrathecal, leptomeningeal, intramedullary, or epidural enhancement at evaluable levels, noting poor assessment at postsurgical levels due to susceptibility artifact from spinal hardware. No gross evidence of  a cord signal abnormality, noting suboptimal assessment. No evidence of cord compression. Multilevel degenerative changes including disc bulges, disc osteophyte complexes, ligamentum flavum thickening, and facet arthropathy. Moderate canal stenosis at C4-C5 and C5-C6. Otherwise, no more than mild canal stenosis at any level. Evaluation of the neural foramina of the lumbar spine is limited due to susceptibility artifact from hardware, however there is no convincing high-grade foraminal stenosis of the lumbar spine. Probable moderate foraminal stenosis on the right at L1-L2 and possibly bilaterally at L4-L5, poorly characterized. Severe foraminal stenosis on the right at T2-T3 and possibly on the right at C7-T1, poorly characterized. Moderate-to-severe foraminal stenosis on the right at T1-T2. Other scattered levels of up-to-moderate thoracic foraminal stenosis. Moderate-to-severe foraminal stenosis on the right at T10-T11. Evaluation of the cervical neural foramina is not possible in the absence axial T2 imaging. Consolidative opacities in the bilateral lungs better appreciated on recent CT abdomen and pelvis, as is retroperitoneal metastatic disease. CONCURRENT SUPERVISION: I have reviewed the images and agree with the fellow's interpretation.    Preliminary created by: Achilles Achilles Signed by: Bolar, Divya 12/02/2023 12:28:31    Impression: IMPRESSION: Patient motion and lumbar hardware susceptibility artifact degrades assessment. Within these limitations: Redemonstrated postsurgical changes related to L2-S1 posterior instrumented fusion, posterior L2-L5 decompression, and L5-S1 anterior fixation. Interbody cages are again seen from L2-L3 through L5-S1. Stable size of a 6.2 x 2.4 cm (SI by AP) collection within the L3/L4 posterior  surgical bed. Please note sterility of the collection cannot be determined by MRI. Interval increase diffuse osseous metastatic disease within the visualized bones, including the spine,  partially imaged ribs and sacrum. No new pathologic fractures identified. Previously noted epidural extension within the midthoracic spine is difficult to delineate from vascular enhancement, though this may be increased within the midthoracic neural foramina. No convincing epidural extension into the bony spinal canal, though again this is difficult to definitively evaluate due to venous epidural enhancement (particularly dorsally at T4). Minimal enhancement along the cauda equina nerve roots at the L5-S1 levels on axial imaging is favored to be vascular, though a leptomeningeal processes not completely excluded with certainty. Otherwise, no convincing suspicious intrathecal, leptomeningeal, intramedullary, or epidural enhancement at evaluable levels, noting poor assessment at postsurgical levels due to susceptibility artifact from spinal hardware. Of note, no specific findings of osteomyelitis-discitis or epidural abscess. However, there is increased marrow edema due to progressive osseous metastasis, which makes superimposed infection difficult to exclude. Redemonstrated degenerative findings with moderate canal stenosis at C4-C5 and C5-C6. Otherwise, no more than mild canal stenosis. No cord or cauda equina nerve root compression. Evaluation of the neural foramina of the lumbar spine is limited due to susceptibility artifact from hardware, however there is no convincing high-grade foraminal stenosis of the lumbar spine. Probable moderate foraminal stenosis on the right at L1-L2 and possibly bilaterally at L4-L5, poorly characterized. Severe foraminal stenosis on the right at T2-T3 and possibly on the right at C7-T1, poorly characterized. Moderate-to-severe foraminal stenosis on the right at T1-T2 and T10-T11. Other scattered levels of up-to-moderate thoracic foraminal stenosis.     CT Abdomen And Pelvis W/O Contrast    Result Date: 11/29/2023  Narrative: EXAM DESCRIPTION: CT ABDOMEN AND PELVIS W/O CONTRAST  CLINICAL HISTORY: Metastatic urothelial carcinoma. Evaluate placement of nephrostomy tube. TECHNIQUE: COVERAGE: Abdomen and pelvis IV CONTRAST: None PHASE ACQUIRED: Noncontrast POSITIVE ORAL CONTRAST GIVEN: No ADVERSE EVENTS: None Up-to-date CT equipment and radiation dose reduction techniques were employed. CTDIvol: 12.8 mGy. DLP: 711 mGy-cm. COMPARISON: PET-CT 11/13/2023, CT urogram 08/22/2023 FINDINGS: LOWER CHEST: Trace bilateral pleural effusions. Left basilar consolidative opacities with air bronchograms. LIVER: Unremarkable BILIARY: Unremarkable PANCREAS: Unremarkable SPLEEN: Unremarkable ADRENALS: Unremarkable KIDNEYS: Right nephrectomy. Moderate left hydronephrosis. Retraction of the nephrostomy tube with distal tip now terminating in the left inferior pole cortex (series 601, image 56). Perinephric and periureteral stranding. No nephrolithiasis. STOMACH/DUODENUM: Unremarkable VASCULATURE: Moderate atherosclerosis without aneurysm. Severe small vessel calcifications. LYMPHATICS: Stable 8 mm left retroperitoneal lymph node which had increased uptake on the prior PET-CT BOWEL: Large rectal stool ball measures up to 8.7 cm. No perirectal inflammatory change. Scattered colonic diverticulosis without evidence of diverticulitis. Small volume abdominal ascites. BLADDER/PELVIC ORGANS: Unremarkable BONES/SOFT TISSUES: Posterior fusion hardware. Multifocal sclerotic metastases, unchanged. Chronic rib fracture deformities. No acute pathologic fracture. CONCURRENT SUPERVISION: I have reviewed the images and agree with the resident/fellow's interpretation. DOSE STATEMENT: "[facility] System CT scanners employ modern techniques for CT dose reduction, including protocol review, automatic exposure control, and iterative reconstruction techniques. These features assure that radiation dose levels in CT are optimized and are consistent with state-of-the-art, low dose CT practice."    Preliminary created by: Lorence Rolls Signed by: Sirlin, Claude 11/29/2023 12:14:53    Impression: IMPRESSION: Interval retraction of the left nephrostomy tube, now terminating in the inferior pole cortex. This contributes to new moderate left hydronephrosis with superimposed perinephric/periureteral stranding, nonspecific but may represent inflammation/infection. Recommend consultation with interventional radiology for  consideration of repositioning Consolidative left basilar opacities, likely infectious/inflammatory.. Large rectal stool ball without definite evidence of stercoral colitis. Consider disimpaction. Stable osseous and retroperitoneum metastatic disease. Note that assessment of solid organs is limited in the absence of intravenous contrast. Important findings delineated above were seen at 09:10 on 11/29/2023 and were verbally communicated by Clayburn Curb  to the care provider, Federico Hopkins, MD, at 09:10 on 11/29/2023.   CTRM:2002:verbal.     US  Lower Extremity Venous Duplex Bilateral    Result Date: 11/28/2023  Narrative: EXAM DESCRIPTION: US  LOWER EXTREMITY VENOUS DUPLEX BILATERAL CLINICAL HISTORY: Oedema, hypercoagulable; concern for DVT TECHNIQUE: Real-time grey-scale and colour Doppler were performed. Spectral Doppler waveforms were obtained. COMPARISON: 05/03/2023 FINDINGS: RIGHT: Common femoral vein: Normal flow and compressibility. Deep femoral vein: Normal flow Greater saphenous vein: Normal compressibility and flow. Femoral vein: Normal flow and compressibility. Popliteal vein: Normal flow and compressibility. Proximal calf vein: Normal flow. LEFT: Common femoral vein: Normal flow and compressibility. Deep femoral vein: Normal flow Greater saphenous vein: Normal compressibility and flow. Femoral vein: Normal flow and compressibility. Popliteal vein: Normal flow and compressibility. Proximal calf vein: Normal flow. Colour flow was normal. Spectral waveforms are within normal limits.    Signed by: Lazarus Primer 11/28/2023 23:33:07    Impression: IMPRESSION: No DVT in the bilateral lower extremity.    X-Ray Chest Single View    Result Date: 11/28/2023  Narrative: EXAM DESCRIPTION: X-RAY CHEST SINGLE VIEW CLINICAL HISTORY: Weakness COMPARISON: 03/21/2023 FINDINGS: See impression    Signed by: Lazarus Primer 11/28/2023 15:24:24    Impression: IMPRESSION: No pneumonia, oedema or pneumothorax. Subcutaneous venous port remains in place in the right chest wall. Mixed osseous changes with some sclerotic metastases less conspicuous, others new.      Pathology:    12/22/22 L iliac crest biopsy    FINAL PATHOLOGIC DIAGNOSIS:   A: Left iliac crest, biopsy:   - Metastatic urothelial carcinoma.     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     We discussed the role of palliative radiation for his metastatic urothelial cancer. He is having pain predominately in the lower back. He also has some epidural extension in the mid T-spine along with severe foraminal stenosis of C7-T2 that may be contributing to his upper extremity weakness. We would recommend palliative RT for symptomatic and local control. If left untreated, these areas could progress to worsening symptoms and/or fracture. He has logistical barriers to daily treatments and had some relief from his previous 8 Gy x 1 fx to this right shoulder. We would recommend a 8 Gy x 1 fx to his L2-L4 and C7-T4. He would like to proceed.     Plan:     1) CT sim WO contrast  2) 3D CRT , 8 Gy x 1 fx = 8 Gy x 1 fx to L2-L4 and C7-T4    INFORMED CONSENT:  I reviewed the rationale and goal of the proposed treatment course. The radiotherapeutic procedure with associated side effects and potential complications were explained. We reviewed together the departmental consent form. The patient  had the opportunity to ask questions which were answered to the best of my knowledge. The patient voiced understanding of the above and has elected to proceed with treatment.    The above plan was discussed  with Dr. Rodolph Clap    Thank you for allowing me to participate in the care of this patient. Please feel free to contact me with any questions or concerns.    Margaretha Shave, MD  Radiation Oncology PGY-3    MEDICAL STUDENT, RESIDENT and ATTENDING: I was physically present with the resident during the performance of the history, examination and medical decision making. I personally examined the patient and discussed the case with the resident. I reviewed and agree with the findings and plan as documented by Margaretha Shave, MD. My additions and revisions are included in this record.      ---------------------(data below generated by Antonio Baumgarten, MD)--------------------     Patient Verification & Telemedicine Consent & Financial Waiver:    1.   Identity: I have verified this patient's identity to be accurate.  2.   Consent: I verify consent has been secured in one of the following methods: (a) obtained written/ online attestation consent (via MyChartVideoVisit pathway), (b) the spoke-side provider has obtained verbal or written consent from patient/surrogate (if this is a "provider to provider" evaluation), or (c) in all other cases, I have personally obtained verbal consent from the patient/ surrogate (noting all elements below) to perform this voluntary telemedicine evaluation (including obtaining history, performing examination and reviewing data provided by the patient).   The patient/ surrogate has the right to refuse this evaluation.  I have explained risks (including potential loss of confidentiality), benefits, alternatives, and the potential need for subsequent face to face care. Patient/ surrogate understands that there is a risk of medical inaccuracies given that our recommendations will be made based on  reported data (and we must therefore assume this information is accurate).  Knowing that there is a risk that this information is not reported accurately, and that the telemedicine video, audio, or data feed may be incomplete, the patient agrees to proceed with evaluation and holds us  harmless knowing these risks.  3.   Healthcare Team: The patient/ surrogate has been notified that other healthcare professionals (including students, residents and Engineer, maintenance) may be involved in this audio-video evaluation.   All laws concerning confidentiality and patient access to medical records and copies of medical records apply to telemedicine.  4.   Privacy: If this is a Restaurant manager, fast food Visit, the patient/ surrogate has received the [place] Notice of Privacy Practices via E-Checkin process.  For all other video visit techniques, I have verbally provided the patient/ surrogate with the [place] web link in [place] ([email or web address]) or Spanish ([email or web address]).  The patient/ surrogate acknowledges both being provided the NPP link, and has been offered to have the NPP mailed to the patient/ surrogate by US  mail.  The patient/ surrogate has voiced understanding an acknowledgement of receipt of this NPP web address.  If the patient/surrogate has elected to receive the NPP via US  mail, I verify that the NPP will be sent promptly to the patient/surrogate via [place]  mail.  5.   Capacity: I have reviewed this above verification and consent paragraph with the patient/ surrogate and the patient is capacitated or has a surrogate. If the patient is not capacitated to understand the above, and no surrogate is available, since this is not an emergency evaluation, the visit will be rescheduled until such time that the patient can consent, or the surrogate is available to consent. If this is an emergency evaluation and the patient is not capacitated to understand the above, and no  surrogate is available, I am proceeding with this  evaluation as this is felt to be an emergency setting and no appropriate specialist is available at the bedside to perform these evaluations.  6.   Financial Waiver: If this is a Restaurant manager, fast food Visit, the patient has been made aware of the financial waiver via E-Checkin process.  For all other video visit techniques, an E-Checkin process is not performed.  As such, I have personally verbally informed the patient/ surrogate that this evaluation will be a billable encounter similar to an in-person clinic visit, and the patient/ surrogate has agreed to pay the fee for services rendered.  If we are billing insurance for the patient's telehealth visit, his out-of-pocket cost will be determined based on his plan and will be billed to him.  The patient/ surrogate has also been informed that if the patient does not have insurance or does not wish to use insurance, [facility] [address] [employer] price for a primary care telehealth visit is $59.00 and specialist telehealth visit is $88.00.  I have further informed the patient/ surrogate that in the event the patient has additional services provided in conjunction with the specialty visit (Ex. Psychotherapy services), those services will be billed at the current rate less a 45% discount.  7.   Intra-State Location: The patient/ surrogate attests to understanding that if the patient accesses these services from a location outside of [place] , that the patient does so at the patient's own risk and initiative and that the patient is ultimately responsible for compliance with any laws or regulations associated with the patient's use.  8.   Specific Use:The patient/ surrogate understands that [place] makes no representation that materials or servicesdelivered via telecommunication services, or listed on telemedicine websites, are appropriate or available for use in any other location.     Location: Home address on file

## 2023-12-21 ENCOUNTER — Encounter (HOSPITAL_BASED_OUTPATIENT_CLINIC_OR_DEPARTMENT_OTHER): Payer: Self-pay | Admitting: Hematology & Oncology

## 2023-12-21 ENCOUNTER — Other Ambulatory Visit: Payer: Self-pay

## 2023-12-21 ENCOUNTER — Encounter (HOSPITAL_BASED_OUTPATIENT_CLINIC_OR_DEPARTMENT_OTHER): Payer: Self-pay | Admitting: Radiation Oncology

## 2023-12-21 ENCOUNTER — Ambulatory Visit: Payer: Medicare Other | Admitting: Hematology & Oncology

## 2023-12-21 ENCOUNTER — Ambulatory Visit
Admit: 2023-12-21 | Discharge: 2023-12-22 | Payer: Medicare Other | Attending: Hematology & Oncology | Admitting: Hematology & Oncology

## 2023-12-21 DIAGNOSIS — G893 Neoplasm related pain (acute) (chronic): Secondary | ICD-10-CM

## 2023-12-21 DIAGNOSIS — L271 Localized skin eruption due to drugs and medicaments taken internally: Secondary | ICD-10-CM

## 2023-12-21 DIAGNOSIS — C642 Malignant neoplasm of left kidney, except renal pelvis: Secondary | ICD-10-CM

## 2023-12-21 DIAGNOSIS — C689 Malignant neoplasm of urinary organ, unspecified: Secondary | ICD-10-CM

## 2023-12-21 NOTE — Patient Instructions (Addendum)
 Patient Instructions:    - Schedule a return follow visit with Dr. Annette Barters  on 1/31 by mychart video visit. Please schedule your follow-up appointment on your way out today at the front desk, or by calling [phone].      - You will have your CBC, CMP, and Phosphorus  drawn every 3 weeks.    - Pick up erdafitinib  6 mg     - Increase your Senna for your constipation      Future Appointments   Date Time Provider Department Center   12/22/2023  2:00 PM Sheela Denmark, MD [facility] [place] [facility]   12/25/2023  9:00 AM Tolbert Fothergill, MD [facility] On[facility]       Have questions about Advanced Care Planning?  PREPARE ([email or web address])     For [place] lab locations, please visit  [email or web address]  *If Labs completed outside of [place], it is patient's responsibility to bring a copy of results or send lab results to clinic office.     *[place] [facility] Website:  [email or web address]    Contact Information:   Geraline Knapp, MD   Assistant Professor of Medicine  Medical Oncology  Genitourinary Malignancy    Ronnell Coins, M.S., PA-C  Sr. Physician Assistant    Clinical questions or concerns  Oncology Nurse Case Manager   Deeann Fare RN, BSN  Voicemail: [phone]  Department of Urology   [facility]  [address]  [place], [place] [address]    -If you are unable to reach your nurse AND you have urgent symptoms, please call [facility] [phone]  Open Monday though Friday - 8 a.m. - 9 p.m.    Dr. Annette Barters Follow Up Appointment Assistance  Administrative Assistant   [place]  T: [phone]  F: [phone]    Monday-Friday 8:00- 4:30p.m. (Closed Holidays and Weekends)  Please listen to message carefully for daily information.   Leave a detailed message with your name, medical record number and   telephone number. Messages are usually returned within 24 hours.     [facility]  [facility] Phone List     AFTER HOURS EMERGENCY NUMBER   Ask for the Oncologist Physician On-Call.                   [phone]    Outpatient Pavilion Scheduling line: [phone]  Call for information about our new location and to schedule or cancel appointments.    [facility] Scheduling line: [phone]   Call to schedule your US , CT Scans or MRI studies.    PET Scan Scheduling line: [phone]  Call to schedule your PET scan    Prostate MRI Scheduling line: [phone] or [phone]  Call to schedule your prostate MRI.   Performed at the [facility] ([facility]).    Nuclear Medicine Scheduling Line [phone]   Call to schedule your bone scan, Radium 223     [facility]fusion Center: [phone]   Call to schedule, cancel, or reschedule chemotherapy appointments.  Infusion Center Hours--- Monday-Friday 7:00-7:30 p.m.; Sat-Sun 8:00-4:30pm.    [facility] ([address]lo[address]): [phone]  Hours: Weekdays, 7:30 a.m. - 5:30 p.m., Weekends (9th floor only), 7:30 am - 6 p.m.    [facility]: [phone]  Monday-Friday 8:00-5:00 p.m.  Mail order options available     Radiation Oncology: [phone]    Call to schedule, cancel, or reschedule radiation  appointments    Social Worker  [place]: [place], [place]    T: [phone]  [facility]: [place], [place]  T: [phone]    Financial Counselors   [phone] at [facility]   [phone] or [phone]    [facility]  [address] [place] [address]    -You can also reach us  by MyChart. To set up your MyChart please see the following site: [email or web address]  -For cancer support services, please visit out Patient and [facility].  -For [facility] support services, please visit:  -[email or web address]  -For other support from the American Cancer  Society, please visit: http://www.cancer.org/Treatment/SupportProgramsServices/index  -Advanced care planning: [email or web address]      Thank you for allowing us  to participate in your care today! We value your feedback as we strive to provide every patient with an exceptional care experience.     You may receive a patient satisfaction survey in the mail in the next few weeks. Please fill out the survey upon receipt and return it in the self-addressed envelope. Your feedback will be used to help improve the experience for all our patients at [facility].     If you want to share a positive experience you had at our facility please call We Listen at [phone]. If you have any suggestions on how we can improve our services please call the [facility] leadership team at [phone].    For medical questions or emergencies, please call your physician's office or 911. Thank you!

## 2023-12-21 NOTE — Patient Instructions (Signed)
 [facility]  RADIATION ONCOLOGY DEPARTMENT  Monday - Friday 8:00 AM to 5:00 PM   Telephone Number: [phone]    Your Radiation Oncologist is Dr. Andi Banas    For clinical questions or concerns contact:  Telephone Number: [phone]  Monday-Thursday    For scheduling contact:   Sam Creighton Electronics engineer)  Telephone Number: [phone]    Preparing for Radiation Therapy Class  When:  Each Wednesday from 1:00-2:00 PM - you are welcome to attend as many times as you wish  Where:  via Zoom  Registration: Please register online to receive the Zoom details to join the class at [email or web address]  and go to Patient Resources - Patient Family Events - Preparing for Radiation Therapy Class.    Radiation Treatment Planning:  The next appointment in our department will be for a CT simulation (mapping scan), which is done with a CT scanner.    Our Environmental health practitioner will call you with your CT Simulation date, once she receives authorization to treat from your insurance company. The approval from your insurance can take up to two weeks to obtain.  The imaging from this scan is used to plan and develop the individual, specialized treatment for your tumor.  The imaging is also used to determine the exact location and measurement of the treatment area, and to create a shield to protect normal tissue.  Small marks (tattoos or dots of colored ink) may be placed on your skin during this appointment. These marks show where to aim the radiation.  You will receive your radiation start date at your CT simulation appointment.  Your treatment usually begins 10-14 business days after your CT simulation for the prescribed number of treatments.  We know it can be difficult to wait for your treatment start date, but it is very important that your physician and our staff have time to ensure your treatment is planned accurately and safely.     Oncology Authorization  Phone #  Phone # [phone] for Pre-Service Authorization Questions   Hours of operation Monday - Friday 8a-4p    Post Service Insurance & Billing Questions:  Thank you for choosing [facility] for your medical care. We're here to help. For billing questions, you can:  Call [phone], 9 a.m. to 6 p.m. Monday to Friday  Email us  at [email or web address] (include the patient's guarantor/account number, medical record number or telephone number to help us  locate your account)  Visit Frequently Asked Questions for information about facility fees, insurance, financial assistance, your billing statements and general billing issues  [email or web address]    Radiation Appointments:  We treat Monday through Friday.    All of your treatment appointments will be scheduled by our Radiation Therapists. Please discuss all treatment scheduling concerns directly with your Radiation Therapist when you come for your first appointment.    On Treatment Visits:   Dr. Rodolph Clap will check on you each week during treatment.    This is usually done on Wednesday, immediately following your scheduled treatment time.  Please address any questions or concerns at this visit.     About the Treatment:  What is external beam radiation therapy?  It is a common cancer treatment that uses high doses of radiation to destroy cancer cells and shrink tumors.   A large machine aims radiation at the cancer.  The machine moves around you without touching you.   Nothing will touch or burn you, and you will not feel anything during the treatment.  It does not make you radioactive.     How does treatment work?   At high doses, radiation is used as an x-ray to take pictures inside your body. In cancer treatment, higher doses of radiation are used to destroy cancer cells.     Side Effects and Management    SKIN CARE AND SKIN CHANGES  Check with your doctor or nurse before you put anything on your skin.  Look at the  list of skin products at the bottom of this sheet. Ask your doctor or nurse which ones are okay for you to use.    Protect your skin:  Make sure your clothing covers the area being treated when you are outside. (This may mean wearing a hat).   Wear clothes that are loose.  Choose clothes and bed sheets made of soft cotton.  Use an Neurosurgeon if your doctor or nurse says you can shave.    Care for your skin:  Shower or bathe with warm, not hot, water . Don't shower more than one time a day.  If you bathe, limit baths to two times a week. Bathe for less than 30 minutes.  Gently pat your skin dry after showers or baths.  Don't rub off the markings your radiation therapist made on your skin. They show where to place the radiation.  Don't use heating pads, ice packs, or bandages on the area getting radiation.  Don't use tanning beds.   Ask your doctor or nurse about the best time to put on lotion or other skin products.  Watch out for a more serious skin problem (moist/wet/peeling skin reaction).   If your skin hurts in the area where you get treatment, tell your doctor or nurse.  Your skin might have a moist reaction. Most often this happens in areas where the skin folds, such as behind the ears or under the breasts. It can lead to an infection if not properly treated.   Ask your doctor or nurse how to care for these areas.        What To Do When You Feel Weak Or Tired    FATIGUE    Did you know that most people getting radiation therapy feel very tired?  Fatigue does not mean that the cancer is getting worse or that the treatment is not working.  Feeling tired is normal during this time. You may feel a little tired or very tired during radiation therapy.    TIPS:  Be active if you can.  Most people feel better when they exercise each day. Some people even sleep and eat better when they exercise.  Walk for 15 to 30 minutes each day.  Take a short bike ride or ride an exercise bike.  Choose an exercise or sport that  you enjoy.     Do fewer things.  Ask for help when you need it. You may have times of high and low energy.  Do the activities that are most important to you first.  Ask family and friends for help. They can make meals, drive you to the doctor, or help in other ways.  Learn your limits. Don't fill your day with too many activities. Plan a work schedule that is right for you.  Some people feel well enough to work. Others need to cut back.  Take medical leave if you need to.  Ask your boss if you can work from home.    Plan time to rest.  Many people need  more rest during radiation therapy.  Sleep at least 8 hours each night.  Take short naps during the day. Nap for less than 1 hour at a time.  Read a book or listen to music to relax before going to bed at night.    The detailed side effects of radiation were discussed with you by your physician during the consult. A copy of your consent form detailing these effects was given to you.      Social Work:  A Child psychotherapist is available in Marketing executive for any concerns you may have.   Your nurse or doctor can assist you with an appointment.  The Social Worker can also be reached at [phone].    After Hours Information:  For urgent matters after hours, on weekends, or on holidays please call  [phone] and ask to speak with the Radiation Oncologist ON CALL. If you feel like you are having a medical emergency please go to your nearest emergency room or call 911.

## 2023-12-21 NOTE — Progress Notes (Addendum)
 Specialty Pharmacy - Oral Oncology     Subjective   Stephen [name] is a 73 year old male, who is followed by the specialty pharmacy service for Balversa .    Start Date:  12/25/2023    Indication:  Urothelial carcinoma   Treatment Plan:   Schedule: Balversa  3mg : Take 2 tablets orally daily     Medication Adherence    What concerns does the patient have in regards to their medications: New lower dose due to Hand Foot Syndrome and Diarrhea  Patient reported X missed doses in the last month: >5  Any gaps in refill history greater than 2 weeks in the last 3 months: yes  Demonstrates understanding of importance of adherence: yes  Informant: patient  Reliability of informant: reliable  Provider-estimated medication adherence level: 90-100%  Reasons for non-adherence: adverse effects  Adherence tools used: calendar  Support network for adherence: healthcare provider  Confirmed plan for next specialty medication refill: pick-up at pharmacy  Refills needed for supportive medications: not needed       Adverse Effects    Adverse events reported: Yes  hand-foot syndrome: Pos  Diarrhea: Pos       Medication Review  Medication review was performed: yes    Drug Interactions    Drug Interactions Evaluated: yes  Clinically Relevant Drug Interactions Identified: yes   Interactions list: Loperamide  and Erdafitinib   Loperamide  may have side effects increased by Erdafitinib     Monitor for loperamide -associated adverse reactions, such as CNS effects and cardiac toxicities (i.e., syncope, ventricular tachycardia, QT prolongation, torsade de pointes, cardiac arrest), if coadministered with erdafitinib . Concurrent use may increase loperamide  exposure. Loperamide  is a P-gp substrate and erdafitinib  is a P-gp inhibitor. Coadministration with another P-gp inhibitor increased loperamide  plasma concentrations by 2- to 3-fold.   Provided the patient with educational material regarding drug interactions: not applicable  Food Interactions  Evaluated: no       Objective   Lab Results   Component Value Date    MG 1.6 11/30/2023    NA 143 12/04/2023    K 3.3 (L) 12/04/2023    CL 105 12/04/2023    BICARB 30 (H) 12/04/2023    BUN 20 12/04/2023    CREAT 1.25 (H) 12/04/2023    GLU 123 (H) 12/04/2023    [place] 8.8 12/04/2023    CPK 37 11/28/2023    GFRNON 54 10/01/2021    EGFRCKDEPI >60 12/04/2023     Lab Results   Component Value Date    WBC 3.5 (L) 12/03/2023    HGB 8.8 (L) 12/03/2023    HCT 27.2 (L) 12/03/2023    PLT 139 (L) 12/03/2023    LYMPHS 36.5 12/03/2023    ABSNEUTRO 1.6 12/03/2023    IANC 3.3 07/27/2023     Lab Results   Component Value Date    AST 20 11/29/2023    ALT 22 11/29/2023    ALK 93 11/29/2023    TBILI 1.36 (H) 11/29/2023    DBILI 0.2 09/21/2023    TP 5.2 (L) 11/29/2023    ALB 2.7 (L) 11/29/2023     Assessment & Plan   Indication, effectiveness, safety and convenience of his specialty medication(s) were reviewed today. Based on the information reviewed, therapy is appropriate at this time    Patient Counseling    Counseled the Patient on the Following: patient counseling declined, provided monograph and education materials           Clinical Specialty Pharmacy Goals  Based on continuation of therapy, clinical notes, and PCC encounters, patient's symptoms are: stable (although pt had side effects while on 4mg , MD reduce dose, and symptoms are improving)  Based on documented history of reported issues, if applicable, patient is tolerating specialty & associated axillary medication(s) without adverse effects: Yes  Based on fill history, patient is routinely filling specialty medication(s) & associated axillary medications at the [facility]: Yes  Based on documented clinical notes and [facility] telephone encounters patient is taking/administering specialty & axillary medication(s) properly: Yes      Follow-up: 12 weeks    [name] Talarico Dung Chenay Nesmith, [facility]  Specialty Pharmacist

## 2023-12-21 NOTE — Addendum Note (Signed)
 Addended byJeffie Mingle on: 12/21/2023 04:30 PM     Modules accepted: Orders

## 2023-12-21 NOTE — Interdisciplinary (Signed)
 PATIENT NAME: Stephen [name]  DATE OF BIRTH: April 10, 1951  MRN: [id number]    New Patient: Patient was seen to discuss radiation recommendations for spine.  Seen by video visit    REVIEW OF SYSTEMS:         Review of Systems   Constitutional:  Positive for fatigue.   HENT:  Positive for mouth sores.    Gastrointestinal:  Positive for constipation.   Musculoskeletal:  Positive for back pain.     Advance Care Planning       Based on above information I recommended the following:  patient/family declined to address with me at this time    Total time spent face-to-face with patient and/or surrogate decision maker providing counseling related to advance care planning:   2 minutes  Advance Care Planning      Fall Risk  1 or more of the following as reported by patient/caregiver = patient risk for falls: No fall risk identified     Interventions implemented during clinic visit: Other: see coments     Fall Risk Interventions:               Continue use of mobility device (cane, walker, wheelchair)      Education provided to patient/caregiver on fall risk factors and precautions.      Nutrition         Nutrition Interventions:   MST score < 2, referral not indicated, no additional questions or concerns      Education provided on importance of early nutrition intervention for improved  symptom management and outcomes, with in-person or telehealth visit and  family involvement.     Wellbeing Screening    Screening      No questionnaires available.                        Wellbeing Screening Interventions : Wellbeing Screening due today and patient declined to respond. Education provided on Wellbeing Screening and access via MyChart    Pain   Pain Score  Pain Score: 8  Pain Orientation (primary site): Lower  Pain Location: Back     Pain interventions:     Continue pharmacologic intervention(s)    Active CIEDs       No active implants to display in this view.            Patient Lines/Drains/Airways Status       Active PICC Line /  CVC Line / PIV Line / Drain / Airway / Intraosseous Line / Epidural Line / ART Line / Line Type / Wound / Pressure Ulcer Injury       Name Placement date Placement time Site Days    Port A Cath Single Lumen  - 02/13/23 Right Internal jugular 02/13/23  0908  Internal jugular  310    Nephrostomy -  Left 03/21/23  1610  Left  274    Nephrostomy -  Left 04/27/23  1900  Left  237    Nephrostomy -  Left 11/29/23  1151  Left  21    Stent -  Ureteral (left) 12/24/21  1102  Ureteral (left)  726                     Social History:  Resides: in [place] with sister  Support system: Lives with Sister  Transportation: Will be able to get transport to [place].  Lives in [place].   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       Language: English            Learning Needs Assessment:      Learner: Patient   Challenges to learning: No Barriers   Readiness to learn: Readiness to learn: Acceptance   Method: Explanation    Patient Education:   New cancer-directed therapy teaching:no    Education Materials:  Other: New patient instructions provided electronically    Educated on plan of care, office contact, and AVS using teach-back method. Questions answered to patient/caregiver satisfaction.     Patient/caregiver verbalized understanding

## 2023-12-21 NOTE — Progress Notes (Signed)
 Name: Stephen [name]   Date of Birth: [birth year]  Medical Record Number: [id number]    [facility]     Patient ID: Metastatic urothelial carcinoma    Stage: Cancer Staging   Ureter malignant neoplasm, left (CMS-HCC)  Staging form: Renal Pelvis And Ureter, AJCC 8th Edition  - Clinical: Stage IV (cTX, cN0, pM1) - Signed by Barkley Li, MD on 12/01/2022    Oncologic History:    04/22/21  CTU: filling defect in L renal pelvis  05/05/21  Cysto with L ureteroscopy revealed a L papillary tumor, biopsied: mixed high (30%) and low grade urothelial carcinoma  06/16/21  Repeat cysto, papillary tumor: pathology low grade papillary UC  09/xx/22  Completed induction with mitogel (6 cycles)  08/19/21  HGTa of the bladder, no muscle  08/26/21  Repeat TURBT: NED, muscle identified  10/26/21  MRU: mild thickening in the L renal pelvis  12/24/21  Cysto/L ureteroscopy: dysplasia in the bladder; no disease in L ureter  05/06/22  L PCN placed  06/01/22  TURBT and L ureteroscopy: NED, stricture in place, stent placed  09/29/22  TURBT and L ureteroscopy: NED  10/22/22  MRU: Mild diffuse thickening in the L ureter/renal pelvis. Multiple osseous lesions involving the bilateral iliac crest and right acetabulum concerning for osseous metastatic disease.   11/26/22  PET/CT: diffuse osseous FDG activity with discrete osseous lesions concerning for metastatic disease.  12/22/22  L iliac crest biopsy: metastatic urothelial carcinoma  01/09/23  Seen by ortho, recommend palliative XRT  01/17/23  Seen by rad onc, simulation planned for 2/02  01/20/23  C1D1 EV 1.25 mg/kg D1,8 of 21 day cycle + pembrolizumab  200 mg IV D1 of 21 day cycle  02/11-02/19  Admitted to [facility] with AMS and resp failure 2/2 opiates.  Also treated for septic shock, AKI (Cr 2.5) and acute liver injury (AST/ALT 2800/2100).   S/p exchange L PCN.   02/07/23  start XRT to R Shoulder  02/10/23  C2D1 EV (dose reduced to 1.0 mg/kg) + pembro 200 mg  IV  03/03/23  C3D1 EV/pembro, missed D8  03/06/23  Cysto: NED.  Cytology NED.   03/14/23  Seen at [facility] ED for dislodged tube, replaced  04/02-05/24  Admitted with weakness, poor PO intake.   04/06/23  C4D1 EV/pembro  04/13/23  C4D8 EV  04/26/23  PET/CT: osseous lesions much improved, many new sclerotic lesions c/w treatment change.  Nonspecific mediastinal and pelvic LN  05/09-11/24  Admitted for treatment of AKI and rhabdomyolysis  05/11/23  C5D1 EV/P DR to 0.75  06/01/23  C6D1 EV/Pembro 0.75  06/23/23  MRI C-Spine: disease in spine, but no fracture or epidural involvement.  moderate spinal canal stenosis from DJD at C4-C5, and C5-C6 and abutment of ventral cord  06/29/23  C7D1  EV/Pembro 0.75   07/19/23  PET/CT: POD in Lns and osseous lesions  07/20/23  C8D1  EV/Pembro 0.75  09/xx/24  Attempted to place on FGFR trial  08/22/23  CT chest, urogram:   09/20/23  Started on erdafitinib   11/13/23  PET/CT: overall interval progression of previous thoracic, mediastinal and abdominal lymph adenopathy. 2. Although there has been interval improvement/resolution of previous multiple lesions in the sternum, spine and pelvis, there are multiple new lesions in L4, right posterior L2, and left lateral 4th and 7th ribs.  11/23/23  Saw me, significant side effects from erda (HFS, diarrhea), held erda  12/10-16/24  Admitted for UTI, constipation and   12/02/23  MRI  spine: diffuse osseous disease, epidural extension at T4, minimal enhancement at L5-S1 of cauda equina nerve roots, no evidence of leptomeningeal disease      Interim History:  MCVV for follow up.  He is currently on 8 mg/4 mg every other day.  He feels a little tender in his feet.  Fingers/hands are weak.   Back still hurts.  Kidney tube is leaking.  He's taking diaudid less frequently.  He's also on lyrica  with the gabapentin   Bms are not good, still having constipation.   He's eating, but he's not eating very well.   Planned for: 3D CRT , 8  Gy x 1 fx = 8 Gy x 1 fx  to L2-L4 and C7-T4     Review of Systems:   A complete ROS was performed and is negative except as documented above.     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  Non smoker  No etoh    Medications:  Current Outpatient Medications on File Prior to Visit   Medication Sig Dispense Refill    acetaminophen  (TYLENOL ) 500 MG tablet Take 2 tablets (1,000 mg) by mouth every 8 hours as needed for Mild Pain (Pain Score 1-3) or Moderate Pain (Pain Score 4-6). 40 tablet 0    allopurinol  (ZYLOPRIM ) 100 MG tablet Take 1 tablet (100 mg) by mouth daily. 30 tablet 0    aluminum -magnesium -simethicone  (MAG-AL PLUS) 200-200-20 MG/5ML suspension Mix viscous lidocaine  2%, Mylanta, and Benadryl  liquid into a 1:1:1 solution.  Take 10mL by mouth every 4 hours as needed for pain.  Swish and spit or swallow for pain. If swallowed, wait 10 minutes prior to eating/drinking. May also apply medication to a Q-tip and dab on areas of sores. 355 mL 2    Artificial Tear Solution (SOOTHE XP OP)       busPIRone  (BUSPAR ) 10 MG tablet Take 2 tablets (20 mg) by mouth 2 times daily. 60 tablet 2    calcium  acetate (PHOSLO ) 667 MG capsule Take 2 capsules (1,334 mg) by mouth 3 times daily (with meals). 180 capsule 2    diphenhydrAMINE  (BENADRYL ) 12.5 MG/5ML liquid Mix viscous lidocaine  2%, Mylanta, and Benadryl  liquid into a 1:1:1 solution.  Take 10mL by mouth every 4 hours as needed for pain.  Swish and spit or swallow for pain. If swallowed, wait 10 minutes prior to eating/drinking. May also apply medication to a Q-tip and dab on areas of sores. 236 mL 2    DULoxetine  (CYMBALTA ) 20 MG CR capsule Take 1 capsule (20 mg) by mouth 2 times daily. 180 capsule 0    Erdafitinib  3  MG TABS Take 6 mg by mouth daily. 60 tablet 11    fluticasone  propionate (FLONASE ) 50 MCG/ACT nasal spray Spray 2 sprays into each nostril daily. 16 g 2    HYDROmorphone  (DILAUDID ) 2 MG tablet Take 1 to 2 tablets (2mg  to 4mg ) by mouth every 4 hours as needed for moderate to severe pain 90 tablet 0    ketotifen  (ALAWAY ) 0.025 % ophthalmic solution Place 1 drop into both eyes 2 times daily.      lidocaine  (LIDODERM ) 5 %  patch Apply 1 patch topically every 24 hours. Leave patch on for 12 hours, then remove for 12 hours. 30 patch 2    lidocaine  (XYLOCAINE ) 2 % solution Mix viscous lidocaine  2%, Mylanta, and Benadryl  liquid into a 1:1:1 solution.  Take 10mL by mouth every 4 hours as needed for pain.  Swish and spit or swallow for pain. If swallowed, wait 10 minutes prior to eating/drinking. May also apply medication to a Q-tip and dab on areas of sores. 200 mL 2    Multiple Vitamins-Minerals (MULTIVITAMIN WITH MINERALS) TABS tablet Take 1 tablet by mouth daily. 30 tablet 1    naloxone  (NARCAN ) 4 mg/0.1 mL nasal spray For suspected opioid overdose, call 911! Then spray once in one nostril. Repeat after 3 minutes if no or minimal response using a new spray in other nostril. 2 each 0    Nutritional Supplements (NEPRO) LIQD Take 3 Cans by mouth daily. 30000 mL 11    nystatin  (MYCOSTATIN ) 100,000 units/mL suspension 5 mL (500,000 Units) by Swish & Swallow route 4 times daily. 280 mL 0    [DISCONTINUED] nystatin  (MYCOSTATIN ) 100,000 units/mL suspension 5 mL (500,000 Units) by Swish & Swallow route 4 times daily. 280 mL 0    polyethylene glycol (GLYCOLAX ) 17 GM/SCOOP powder Take 17 grams (1 capful) by mouth daily. Mix with 4 to 8 ounces of fluid (water , juice, soda, coffee, or tea) prior to administration. 238 g 0    pregabalin  (LYRICA ) 100 MG capsule Take 1 capsule (100 mg) by mouth 2 times daily. 60 capsule 0    prochlorperazine  (COMPAZINE ) 10 MG tablet Take 1 tablet (10 mg) by mouth every 6 hours as needed (Nausea/Vomiting).  90 tablet 2    senna (SENOKOT) 8.6 MG tablet Take 2 tablets (17.2 mg) by mouth at bedtime. 60 tablet 3    tizanidine  (ZANAFLEX ) 2 MG tablet Take 1 tablet (2 mg) by mouth every 8 hours as needed (muscle spasm). 30 tablet 2    traZODone  (DESYREL ) 50 MG tablet Take one tab nightly by mouth at bedtime as needed for insomnia 90 tablet 0     No current facility-administered medications on file prior to visit.       Physical Examination:  There were no vitals filed for this visit.    ECOG PS 1  NAD  MCVV      Labs: Following labs reviewed  Lab Results   Component Value Date    WBC 3.5 (L) 12/03/2023    RBC 2.92 (L) 12/03/2023    HGB 8.8 (L) 12/03/2023    HCT 27.2 (L) 12/03/2023    MCV 93.2 12/03/2023    MCHC 32.4 12/03/2023    RDW 14.6 (H) 12/03/2023    PLT 139 (L) 12/03/2023    MPV 10.7 12/03/2023      Lab Results   Component Value Date    BUN 20 12/04/2023    CREAT 1.25 (H) 12/04/2023    CL 105 12/04/2023    NA 143 12/04/2023    K 3.3 (L) 12/04/2023    [place] 8.8 12/04/2023    TBILI 1.36 (H) 11/29/2023    ALB 2.7 (L) 11/29/2023    TP 5.2 (L) 11/29/2023    AST 20 11/29/2023    ALK 93 11/29/2023    BICARB 30 (H) 12/04/2023    ALT 22 11/29/2023    GLU 123 (H) 12/04/2023                  Assessment:  Myrtie Atkinson  Zayyan Mullen is a 73 year old male with OA, HTN, MDD with L UTUC with metastatic disease ([id number]), with osseous mets (Pet/CT with diffuse enhancement and discrete lesions). FGFR-TACC mutation.  His lines of therapy have included:  EV/P --> POD after 8 cycles  erdafitinib     I personally reviewed labs as above.  I personally reviewed scans as above.    MCVV for follow up.  Looks like he's doing better.  Needs labs, he will get these on 1/6 when he comes in for a visit.  Starting to have HFS, discussed taking breaks for 2-3 days.  We will continue current dose of erda and he will pick up erda 6 mg when he comes in.  Discussed doubling senna for ongoing constipation.   Proceed with radiation to spine    Advanced Urothelial  Carcinoma, [id number]  - continue erdafitinib  6 mg daily (for now, he can alternate 8 mg tabs/4 mg tabs until he gets 6 mg tabs)  - continue phoslo  1333 mg TID  - labs (CBC, CMP, phos) and visits Q3w  - s/p XRT to R shoulder  - plan for XRT to spine as above  - consider adding zometa given significnt bone disease; needs dental clearance - he's known to have significant dental issues so this needs to be cleared first  - RTC 01/18/23, MCVV  - repeat PET/CT at end of February    Hand-foot syndrome  - severe on 8 mg daily  - cream for hands  - watch as lowering dose  - take breaks for 2-3 days when starting to have symptoms    Erda-induced diarrhea  - severe on erda 8 mg daily  - however, may have been due to overflow diarrhea  - imodium  prn  - watch as restarting therapy    Cancer related pain, constipation  - follows with palliative care, on multiple medications for pain including dilaudid   - caution given multiple events of AMS    L PCN, initially placed 04/2022  - he will discuss draining issues with IR    This visit was spent addressing a complex, chronic illness that poses a threat to life or bodily function to this patient.  I independently reviewed the patient's history since prior visit, reviewed intervening labs and imaging, counseled the patient on the management of their chronic illness, and placed orders to be done prior to next visit.     Tatum has advanced urothelial carcinoma, currently on erdafitinib  that has a risk of causing harm and needs monitoring of cbc, LFTs, electrolytes via labs tests every 2 weeks    Tito Formica. Annette Barters, M.D.  Assistant Professor of Medicine   [employer] , [facility]  [facility]  [address]  [place], [place] [address]

## 2023-12-21 NOTE — Addendum Note (Signed)
 Addended by: Geraline Knapp on: 12/21/2023 05:11 PM     Modules accepted: Orders

## 2023-12-22 ENCOUNTER — Encounter (HOSPITAL_BASED_OUTPATIENT_CLINIC_OR_DEPARTMENT_OTHER): Admission: RE | Payer: Self-pay | Source: Ambulatory Visit

## 2023-12-22 ENCOUNTER — Telehealth (INDEPENDENT_AMBULATORY_CARE_PROVIDER_SITE_OTHER): Payer: Medicare Other | Admitting: Nephrology

## 2023-12-22 ENCOUNTER — Ambulatory Visit
Admission: RE | Admit: 2023-12-22 | Payer: Medicare Other | Source: Ambulatory Visit | Admitting: Vascular & Interventional Radiology

## 2023-12-22 ENCOUNTER — Other Ambulatory Visit: Payer: Self-pay

## 2023-12-22 DIAGNOSIS — C642 Malignant neoplasm of left kidney, except renal pelvis: Secondary | ICD-10-CM

## 2023-12-22 DIAGNOSIS — I10 Essential (primary) hypertension: Secondary | ICD-10-CM

## 2023-12-22 DIAGNOSIS — N179 Acute kidney failure, unspecified: Secondary | ICD-10-CM | POA: Insufficient documentation

## 2023-12-22 DIAGNOSIS — N183 Chronic kidney disease, stage 3 unspecified (CMS-HCC): Secondary | ICD-10-CM

## 2023-12-22 SURGERY — IR CHNG NEPHROSTOMY TUBE
Anesthesia: Local | Laterality: Left

## 2023-12-22 NOTE — Interdisciplinary (Signed)
 AVS Discharge instructions given to patient? N/A     Patient verbalized understanding of instructions given? N/A     Did patient have additional questions? N/A If yes, explain:     Next Scheduled Appointment rtc 47mo around 06/20/2024    Patient was notified of appointment policy and cancellation options: N/A     In-Person Yes     MyChart Telemedicine Visit: Yes     Telephone Follow up: Yes

## 2023-12-22 NOTE — Radiation Simulation Directive (Signed)
**   FOR ADMINISTRATIVE USE ONLY **    Radiation Oncology Simulation Directive        Rad Onc Treatment Planning Simulation [RAD08] [phone])   Radiation Oncology    Date and Time: 12/22/2023  4:19 PM   Department: Radiation Oncology PET/CT Center   Ordering/Authorizing: Antonio Baumgarten, MD               Patient Information       Patient Name  Stephen [name], Stephen [name] ([id number]) Legal Sex  Male DOB AGE  Feb 02, 1951 (73 year old)          Photographer       Authorizing Provider Ordering User    Antonio Baumgarten, MD Antonio Baumgarten, MD    [phone]          Order Questions       Question Answer    Is this for a new course of treatment? Yes    Sites: Spine    Treatment Goal Palliative    Patient Info: Previous RT    Area: R shoulder    Facility: [facility]    Consent Obtained Yes    Sim Date: 1st Available    Split Course No    Image Fusion Type: MRI     PET    Image Fusion Series: MRI spine survey (12/02/23, 3D +C); PET/CT (11/03/23)    Images to be Fused: [place]    Estimated Total Dose in Gy: 8    Primary Course: Photon    Photon: # Treatment Fractions (Excluding any sequential boost): 1    Care Path Reason Routine (7-10 days)    Start Date Requested Routine (7-10 Business Days)    Treatment Frequency Everyday Consecutive (M-F)    Treatment Technique 3D    # of Fields: 2    Imaging Modalities for Authorization: CBCT     AlignRT    Photon Sim Location: CT Sim    Scans Supine    Standard Patient Setup per Standards & Guidelines Yes    Scanning Range: C4-T7 and T12 to mid femur.    Actions: BB Site of Pain    Contrast? No    Planned Re-Sim? No    Sequential Boost: No                  Comments       17M metastatic urothelial cancer. 8 Gy x 1 fx to the C7-T4 and L2-L4          The following information represents the intent for the treatment course. The final prescription reflecting the treatment parameters i.e. fractionation, energy, beam arrangement and total dose will be provided in the  Prescribe Treat area of ARIA upon completion and evaluation of the requested dosimetry planning.     In addition to the items above I am ordering:      Beam modification devices as necessary per planning   Monitor Units calculation per treatment.   Verification Sim   Continuing Physics   Immobilization devices as necessary for reproducibility and setup for this patient and will be determined specifically in Sim.

## 2023-12-24 NOTE — Progress Notes (Signed)
 ---------------------(data below generated by Sheela Denmark, MD)--------------------     Patient Verification & Telemedicine Consent & Financial Waiver:    1.   Identity: I have verified this patient's identity to be accurate.  2.   Consent: I verify consent has been secured in one of the following methods: (a) obtained written/ online attestation consent (via MyChartVideoVisit pathway), (b) the spoke-side provider has obtained verbal or written consent from patient/surrogate (if this is a "provider to provider" evaluation), or (c) in all other cases, I have personally obtained verbal consent from the patient/ surrogate (noting all elements below) to perform this voluntary telemedicine evaluation (including obtaining history, performing examination and reviewing data provided by the patient).   The patient/ surrogate has the right to refuse this evaluation.  I have explained risks (including potential loss of confidentiality), benefits, alternatives, and the potential need for subsequent face to face care. Patient/ surrogate understands that there is a risk of medical inaccuracies given that our recommendations will be made based on reported data (and we must therefore assume this information is accurate).  Knowing that there is a risk that this information is not reported accurately, and that the telemedicine video, audio, or data feed may be incomplete, the patient agrees to proceed with evaluation and holds us  harmless knowing these risks.  3.   Healthcare Team: The patient/ surrogate has been notified that other healthcare professionals (including students, residents and Engineer, maintenance) may be involved in this audio-video evaluation.   All laws concerning confidentiality and patient access to medical records and copies of medical records apply to telemedicine.  4.   Privacy: If this is a Restaurant manager, fast food Visit, the patient/ surrogate has received the [place] Notice of Privacy Practices via E-Checkin process.   For all other video visit techniques, I have verbally provided the patient/ surrogate with the [facility] web link in [place] ([email or web address]://[email or web address]) or Spanish ([email or web address]://[email or web address]).  The patient/ surrogate acknowledges both being provided the NPP link, and has been offered to have the NPP mailed to the patient/ surrogate by [place]  mail.  The patient/ surrogate has voiced understanding an acknowledgement of receipt of this NPP web address.  If the patient/surrogate has elected to receive the NPP via US  mail, I verify that the NPP will be sent promptly to the patient/surrogate via [place]  mail.  5.   Capacity: I have reviewed this above verification and consent paragraph with the patient/ surrogate and the patient is capacitated or has a surrogate. If the patient is not capacitated to understand the above, and no surrogate is available, since this is not an emergency evaluation, the visit will be rescheduled until such time that the patient can consent, or the surrogate is available to consent. If this is an emergency evaluation and the patient is not capacitated to understand the above, and no surrogate is available, I am proceeding with this evaluation as this is felt to be an emergency setting and no appropriate specialist is available at the bedside to perform these evaluations.  6.   Financial Waiver: If this is a Restaurant manager, fast food Visit, the patient has been made aware of the financial waiver via E-Checkin process.  For all other video visit techniques, an E-Checkin process is not performed.  As such, I have personally verbally informed the patient/ surrogate that this evaluation will be a billable encounter similar to an in-person clinic visit, and the patient/ surrogate has agreed to pay the fee for services rendered.  If we are billing insurance for the  patient's telehealth visit, his out-of-pocket cost will be determined based on his plan and will be billed to him.  The  patient/ surrogate has also been informed that if the patient does not have insurance or does not wish to use insurance, [facility] [address] [employer] price for a primary care telehealth visit is $59.00 and specialist telehealth visit is $88.00.  I have further informed the patient/ surrogate that in the event the patient has additional services provided in conjunction with the specialty visit (Ex. Psychotherapy services), those services will be billed at the current rate less a 45% discount.  7.   Intra-State Location: The patient/ surrogate attests to understanding that if the patient accesses these services from a location outside of  , that the patient does so at the patient's own risk and initiative and that the patient is ultimately responsible for compliance with any laws or regulations associated with the patient's use.  8.   Specific Use:The patient/ surrogate understands that [employer] makes no representation that materials or servicesdelivered via telecommunication services, or listed on telemedicine websites, are appropriate or available for use in any other location.     Demographics:  Medical Record #: [id number]  Date: Dec 22, 2023  Patient Name: Stephen [name]  DOB: [birth year]  Age: 73 year old  Sex: male  Location: Home address on file  Patient seen Status: Patient was evaluated via telemedicine (audio or audio/video)     Evaluator(s):  Niccolas Loeper was evaluated by me today.    Clinic Location: [facility]   [facility]  [address] [place] [address]     NEPHROLOGY CLINIC VISIT    Reason for Visit: Chronic Kidney Disease    HPI: Stephen [name] is a 73 year old male who is referred to renal clinic for evaluation and management of kidey disease.     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 Last admitted from 12/10-12/16 for UTI and constipation.    During this visit patient reports generalized weakness. Poor appetite. Reports kidney tube is leaking. Denies chest pain and shortness of breath.     Review of labs showed stable kidney function with Scr 1.25 mg/dl. K 3.3 mg/dl.    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    Bladder neck contracture 10/11/2017    Lumbar radiculopathy 10/18/2017    Cervical radiculopathy 01/15/2018    Incomplete bladder emptying 02/01/2018    Lumbosacral spondylosis without myelopathy 05/24/2018    Chronic SI joint pain 12/20/2018    Degenerative spondylolisthesis 03/24/2019    Lumbar stenosis with neurogenic claudication 03/24/2019    Pre-procedure lab exam 04/22/2019    Cervical spondylosis without myelopathy 04/22/2019    Cervical spondylosis 06/11/2019    S/P lumbar fusion 09/06/2019    Chronic left SI joint pain 02/19/2020    Chronic midline thoracic back pain 05/06/2020    Spondylolisthesis at L5-S1 level 08/18/2020    Left renal mass 04/29/2021    Malignant neoplasm of urinary bladder, unspecified site (CMS-HCC) 06/25/2021    Dorsopathy 06/28/2021  Ureter malignant neoplasm, left (CMS-HCC) 10/26/2021    Cauda equina compression (CMS-HCC) 10/29/2021    COVID-19 virus infection 12/10/2021    Community acquired pneumonia 12/10/2021    Acute urinary retention 12/10/2021    HTN (hypertension) 12/10/2021    Gout 12/10/2021    MDD (major depressive disorder) 12/10/2021    Urothelial cancer (CMS-HCC) 12/10/2021    Urinary tract infection associated with indwelling urethral catheter (CMS-HCC) 01/11/2022    Ureteral obstruction 01/11/2022    Hydronephrosis 01/11/2022    Urothelial carcinoma of  kidney, left (CMS-HCC) 02/22/2022    Myalgia, other site 03/07/2022    Trigger point with back pain 03/07/2022    Pre-op evaluation 09/15/2022    Malignant neoplasm of overlapping sites of bladder (CMS-HCC) 11/09/2022    Cancer associated pain 03/21/2023    Rhabdomyolysis 04/27/2023    Acute kidney injury superimposed on chronic kidney disease (CMS-HCC) 04/28/2023    Hypotension 04/28/2023    Altered mental status 04/28/2023    UTI (urinary tract infection) 11/28/2023     Resolved Ambulatory Problems     Diagnosis Date Noted    Acute hypoxemic respiratory failure (CMS-HCC) 12/10/2021    Pyelonephritis 01/10/2022    Sepsis (CMS-HCC) 01/11/2022    AKI (acute kidney injury) (CMS-HCC) 01/11/2022     Past Medical History:   Diagnosis Date    Chronic back pain     Congenital hydronephrosis     Headache     Hematuria     Kidney disease     Kidney stones     Major depressive disorder, single episode     Polyarthropathy or polyarthritis of multiple sites     Retinal detachment     Urethral stricture        Social History, reviewed with patient:  Social History     Socioeconomic History    Marital status: Single     Spouse name: Not on file    Number of children: Not on file    Years of education: Not on file    Highest education level: Not on file   Occupational History    Not on file   Tobacco Use    Smoking status: Never     Passive exposure: Never    Smokeless tobacco: Never   Vaping Use    Vaping status: Never Used   Substance and Sexual Activity    Alcohol  use: No    Drug use: Not Currently    Sexual activity: Not Currently     Partners: Female   Other Topics Concern    Military Service No    Blood Transfusions Yes    Caffeine Concern No    Occupational Exposure No    Hobby Hazards No    Sleep Concern Yes     Comment: due to my lower back discomfort and leg discomfort    Stress Concern Yes     Comment: Health/ housing/ financial    Weight Concern No    Special Diet No    Back Care Yes     Comment: Am very careful with  any activity    Exercises Regularly Yes    Seat Belt Use Yes    Performs Self-Exams Yes    Bike Helmet Use No   Social History Narrative    Not on file     Social Determinants of Health     Financial Resource Strain: Not on file   Food Insecurity: No Food Insecurity (11/29/2023)    Hunger Vital  Sign     Worried About Programme researcher, broadcasting/film/video in the Last Year: Never true     Ran Out of Food in the Last Year: Never true   Transportation Needs: No Transportation Needs (11/29/2023)    PRAPARE - Therapist, art (Medical): No     Lack of Transportation (Non-Medical): No   Physical Activity: Not on file   Stress: Not on file   Social Connections: Unknown (01/31/2023)    Received from [facility]    Social Connections     In the past 3 months, do you feel that you lack companionship or social support?: Not on file   Intimate Partner Violence: Low Risk  (11/29/2023)    [facility] IPV     Have you ever been emotionally or physically abused by your partner or someone important to you?: No     Within the last year have you been hit, slapped, kicked or otherwise physically hurt by someone?: No     Since you've been pregnant, have you been slapped, kicked or otherwise physically hurt by someone?: Not on file     Within the last year has anyone forced you to have sexual activities?: No     Are you afraid of your spouse or partner you listed above?: No   Housing Stability: Low Risk  (11/29/2023)    Housing Stability Vital Sign     Unable to Pay for Housing in the Last Year: No     Number of Times Moved in the Last Year: 0     Homeless in the Last Year: No       Family History, reviewed with patient:  Family History   Adopted: Yes   Family history unknown: Yes       Allergies:  Allergies   Allergen Reactions    Sulfa Drugs Unspecified, Hives and Other       Medications  Reviewed and reconciled with patient during encounter  Current Outpatient Medications on File Prior to Visit   Medication Sig Dispense  Refill    acetaminophen  (TYLENOL ) 500 MG tablet Take 2 tablets (1,000 mg) by mouth every 8 hours as needed for Mild Pain (Pain Score 1-3) or Moderate Pain (Pain Score 4-6). 40 tablet 0    allopurinol  (ZYLOPRIM ) 100 MG tablet Take 1 tablet (100 mg) by mouth daily. 30 tablet 0    aluminum -magnesium -simethicone  (MAG-AL PLUS) 200-200-20 MG/5ML suspension Mix viscous lidocaine  2%, Mylanta, and Benadryl  liquid into a 1:1:1 solution.  Take 10mL by mouth every 4 hours as needed for pain.  Swish and spit or swallow for pain. If swallowed, wait 10 minutes prior to eating/drinking. May also apply medication to a Q-tip and dab on areas of sores. 355 mL 2    Artificial Tear Solution (SOOTHE XP OP)       busPIRone  (BUSPAR ) 10 MG tablet Take 2 tablets (20 mg) by mouth 2 times daily. 60 tablet 2    calcium  acetate (PHOSLO ) 667 MG capsule Take 2 capsules (1,334 mg) by mouth 3 times daily (with meals). 180 capsule 2    diphenhydrAMINE  (BENADRYL ) 12.5 MG/5ML liquid Mix viscous lidocaine  2%, Mylanta, and Benadryl  liquid into a 1:1:1 solution.  Take 10mL by mouth every 4 hours as needed for pain.  Swish and spit or swallow for pain. If swallowed, wait 10 minutes prior to eating/drinking. May also apply medication to a Q-tip and dab on areas of sores. 236 mL 2  DULoxetine  (CYMBALTA ) 20 MG CR capsule Take 1 capsule (20 mg) by mouth 2 times daily. 180 capsule 0    Erdafitinib  3 MG TABS Take 6 mg by mouth daily. 60 tablet 11    fluticasone  propionate (FLONASE ) 50 MCG/ACT nasal spray Spray 2 sprays into each nostril daily. 16 g 2    HYDROmorphone  (DILAUDID ) 2 MG tablet Take 1 to 2 tablets (2mg  to 4mg ) by mouth every 4 hours as needed for moderate to severe pain 90 tablet 0    ketotifen  (ALAWAY ) 0.025 % ophthalmic solution Place 1 drop into both eyes 2 times daily.      lidocaine  (LIDODERM ) 5 % patch Apply 1 patch topically every 24 hours. Leave patch on for 12 hours, then remove for 12 hours. 30 patch 2    lidocaine  (XYLOCAINE ) 2 %  solution Mix viscous lidocaine  2%, Mylanta, and Benadryl  liquid into a 1:1:1 solution.  Take 10mL by mouth every 4 hours as needed for pain.  Swish and spit or swallow for pain. If swallowed, wait 10 minutes prior to eating/drinking. May also apply medication to a Q-tip and dab on areas of sores. 200 mL 2    Multiple Vitamins-Minerals (MULTIVITAMIN WITH MINERALS) TABS tablet Take 1 tablet by mouth daily. 30 tablet 1    naloxone  (NARCAN ) 4 mg/0.1 mL nasal spray For suspected opioid overdose, call 911! Then spray once in one nostril. Repeat after 3 minutes if no or minimal response using a new spray in other nostril. 2 each 0    Nutritional Supplements (NEPRO) LIQD Take 3 Cans by mouth daily. 30000 mL 11    nystatin  (MYCOSTATIN ) 100,000 units/mL suspension 5 mL (500,000 Units) by Swish & Swallow route 4 times daily. 280 mL 0    polyethylene glycol (GLYCOLAX ) 17 GM/SCOOP powder Take 17 grams (1 capful) by mouth daily. Mix with 4 to 8 ounces of fluid (water , juice, soda, coffee, or tea) prior to administration. 238 g 0    pregabalin  (LYRICA ) 100 MG capsule Take 1 capsule (100 mg) by mouth 2 times daily. 60 capsule 0    prochlorperazine  (COMPAZINE ) 10 MG tablet Take 1 tablet (10 mg) by mouth every 6 hours as needed (Nausea/Vomiting). 90 tablet 2    senna (SENOKOT) 8.6 MG tablet Take 2 tablets (17.2 mg) by mouth at bedtime. 60 tablet 3    tizanidine  (ZANAFLEX ) 2 MG tablet Take 1 tablet (2 mg) by mouth every 8 hours as needed (muscle spasm). 30 tablet 2    traZODone  (DESYREL ) 50 MG tablet Take one tab nightly by mouth at bedtime as needed for insomnia 90 tablet 0     No current facility-administered medications on file prior to visit.       Physical exam:  Vitals reviewed  Unlabored breathing    Labs:  Lab Results   Component Value Date    NA 143 12/04/2023    K 3.3 (L) 12/04/2023    CL 105 12/04/2023    BICARB 30 (H) 12/04/2023    BUN 20 12/04/2023    CREAT 1.25 (H) 12/04/2023    GLU 123 (H) 12/04/2023     8.8  12/04/2023     Lab Results   Component Value Date    WBC 3.5 (L) 12/03/2023    HGB 8.8 (L) 12/03/2023    HCT 27.2 (L) 12/03/2023    PLT 139 (L) 12/03/2023     No results found for: "PTHINTACT"  No results found for: "VITD25HYDROX", "VD2", "VD3", "VDT"  Lab Results   Component Value Date    COLORUA Yellow 11/28/2023    APPEARUA Turbid 11/28/2023    GLUCOSEUA Negative 11/28/2023    BILIUA Negative 11/28/2023    KETONEUA Negative 11/28/2023    SGUA 1.013 11/28/2023    BLOODUA 2+ (A) 11/28/2023    PHUA 6.5 11/28/2023    PROTEINUA 2+ (A) 11/28/2023    UROBILUA Negative 11/28/2023    NITRITEUA Negative 11/28/2023    LEUKESTUA 500 (A) 11/28/2023    WBCUA >50 (A) 11/28/2023    RBCUA 21-50 (A) 11/28/2023    HYALINEUA 0-2 11/28/2023       Assessment/Plan: Stephen [name] is a 73 year old male who is referred to renal clinic for evaluation and management of kidey disease.     CKD stage 3 likely due to reduced renal mass (solitary kidney), prostate/urothelial cancer and AKI episode contributing to mal-adaptive repair. Continue to monitor kidney function. Get labs prior to next clinic visit.  Check Cystatin-C. CKD education, prevention and treatment options discussed. Avoid chronic use of NSAID. Discontinue calcium  acetate.    Acid-base/electrolyte: stable and at goal.    Prostate cancer/urothelial cancer: Managed by urology/oncology. Patient will follow with IR regarding tube leakage.    All questions answered. Follow-up in 6 months.    I spent total of 45 min on this patients care on the day of visit, including face to face time with the patient as well as time spent reviewing patient records and test results, documenting in the medical records, obtaining history and exam, counseling and education, placing order and co-ordinating care

## 2023-12-25 ENCOUNTER — Other Ambulatory Visit (INDEPENDENT_AMBULATORY_CARE_PROVIDER_SITE_OTHER): Payer: Medicare Other | Attending: Nephrology

## 2023-12-25 ENCOUNTER — Other Ambulatory Visit (INDEPENDENT_AMBULATORY_CARE_PROVIDER_SITE_OTHER): Payer: Medicare Other

## 2023-12-25 ENCOUNTER — Other Ambulatory Visit: Payer: Self-pay

## 2023-12-25 ENCOUNTER — Telehealth (HOSPITAL_BASED_OUTPATIENT_CLINIC_OR_DEPARTMENT_OTHER): Payer: Self-pay | Admitting: Radiation Oncology

## 2023-12-25 ENCOUNTER — Ambulatory Visit: Payer: Medicare Other | Admitting: Family Medicine

## 2023-12-25 VITALS — BP 107/68 | HR 74 | Temp 98.0°F | Resp 16 | Ht 66.14 in | Wt 156.4 lb

## 2023-12-25 DIAGNOSIS — Z9181 History of falling: Secondary | ICD-10-CM

## 2023-12-25 DIAGNOSIS — K5903 Drug induced constipation: Secondary | ICD-10-CM

## 2023-12-25 DIAGNOSIS — Z936 Other artificial openings of urinary tract status: Secondary | ICD-10-CM

## 2023-12-25 DIAGNOSIS — Z515 Encounter for palliative care: Secondary | ICD-10-CM

## 2023-12-25 DIAGNOSIS — R682 Dry mouth, unspecified: Secondary | ICD-10-CM

## 2023-12-25 DIAGNOSIS — M5412 Radiculopathy, cervical region: Secondary | ICD-10-CM

## 2023-12-25 DIAGNOSIS — C7910 Secondary malignant neoplasm of unspecified urinary organs: Secondary | ICD-10-CM | POA: Insufficient documentation

## 2023-12-25 DIAGNOSIS — G4700 Insomnia, unspecified: Secondary | ICD-10-CM

## 2023-12-25 DIAGNOSIS — C642 Malignant neoplasm of left kidney, except renal pelvis: Secondary | ICD-10-CM | POA: Insufficient documentation

## 2023-12-25 DIAGNOSIS — C7951 Secondary malignant neoplasm of bone: Secondary | ICD-10-CM

## 2023-12-25 DIAGNOSIS — M792 Neuralgia and neuritis, unspecified: Secondary | ICD-10-CM

## 2023-12-25 DIAGNOSIS — N183 Chronic kidney disease, stage 3 unspecified (CMS-HCC): Secondary | ICD-10-CM | POA: Insufficient documentation

## 2023-12-25 DIAGNOSIS — F419 Anxiety disorder, unspecified: Secondary | ICD-10-CM

## 2023-12-25 DIAGNOSIS — K1230 Oral mucositis (ulcerative), unspecified: Secondary | ICD-10-CM

## 2023-12-25 DIAGNOSIS — Z9189 Other specified personal risk factors, not elsewhere classified: Secondary | ICD-10-CM

## 2023-12-25 DIAGNOSIS — T402X5A Adverse effect of other opioids, initial encounter: Secondary | ICD-10-CM

## 2023-12-25 DIAGNOSIS — G893 Neoplasm related pain (acute) (chronic): Secondary | ICD-10-CM

## 2023-12-25 LAB — CBC WITH DIFF, BLOOD
ANC-Automated: 5.7 10*3/uL (ref 1.6–7.0)
Abs Basophils: 0 10*3/uL (ref <0.2)
Abs Eosinophils: 0.1 10*3/uL (ref 0.0–0.5)
Abs Lymphs: 1.9 10*3/uL (ref 0.8–3.1)
Abs Monos: 0.8 10*3/uL (ref 0.2–0.8)
Basophils: 0.4 %
Eosinophils: 0.9 %
Hct: 30.6 % — ABNORMAL LOW (ref 40.0–50.0)
Hgb: 9.9 g/dL — ABNORMAL LOW (ref 13.7–17.5)
Imm Gran %: 0.2 % (ref <1)
Imm Gran Abs: 0 10*3/uL (ref <0.1)
Lymphocytes: 22 %
MCH: 30.2 pg (ref 26.0–32.0)
MCHC: 32.4 g/dL (ref 32.0–36.0)
MCV: 93.3 um3 (ref 79.0–95.0)
MPV: 10.7 fL (ref 9.4–12.4)
Monocytes: 9.1 %
Plt Count: 149 10*3/uL (ref 140–370)
RBC: 3.28 10*6/uL — ABNORMAL LOW (ref 4.60–6.10)
RDW: 14.6 % — ABNORMAL HIGH (ref 12.0–14.0)
Segs: 67.4 %
WBC: 8.5 10*3/uL (ref 4.0–10.0)

## 2023-12-25 LAB — COMPREHENSIVE METABOLIC PANEL, BLOOD
ALT (SGPT): 24 U/L (ref 0–41)
AST (SGOT): 33 U/L (ref 0–40)
Albumin: 3.8 g/dL (ref 3.5–5.2)
Alkaline Phos: 96 U/L (ref 40–129)
Anion Gap: 11 mmol/L (ref 7–15)
BUN: 33 mg/dL — ABNORMAL HIGH (ref 8–23)
Bicarbonate: 32 mmol/L — ABNORMAL HIGH (ref 22–29)
Bilirubin, Tot: 0.88 mg/dL (ref <1.2)
Calcium: 10.7 mg/dL — ABNORMAL HIGH (ref 8.5–10.6)
Chloride: 95 mmol/L — ABNORMAL LOW (ref 98–107)
Creatinine: 1.48 mg/dL — ABNORMAL HIGH (ref 0.67–1.17)
Glucose: 82 mg/dL (ref 70–99)
Potassium: 5.6 mmol/L — ABNORMAL HIGH (ref 3.5–5.1)
Sodium: 138 mmol/L (ref 136–145)
Total Protein: 7 g/dL (ref 6.0–8.0)
eGFR Based on CKD-EPI 2021 Equation: 51 mL/min/{1.73_m2}

## 2023-12-25 LAB — CYSTATIN C, SERUM WITH EGFR
Cystatin C, Serum: 2 mg/L — ABNORMAL HIGH (ref 0.61–0.95)
eGFR Based on Cystatin C: 30 mL/min (ref >60)

## 2023-12-25 LAB — PHOSPHORUS, BLOOD: Phosphorous: 6.5 mg/dL — ABNORMAL HIGH (ref 2.7–4.5)

## 2023-12-25 MED ORDER — HYDROMORPHONE HCL 2 MG OR TABS
ORAL_TABLET | ORAL | 0 refills | Status: AC
Start: 2023-12-25 — End: ?
  Filled 2023-12-25: qty 90, 8d supply, fill #0

## 2023-12-25 MED ORDER — GABAPENTIN 300 MG OR CAPS
300.0000 mg | ORAL_CAPSULE | Freq: Three times a day (TID) | ORAL | 2 refills | Status: AC
Start: 2023-12-25 — End: ?
  Filled 2023-12-25: qty 90, 30d supply, fill #0

## 2023-12-25 MED ORDER — GABAPENTIN 300 MG OR CAPS
300.0000 mg | ORAL_CAPSULE | Freq: Three times a day (TID) | ORAL | Status: DC
Start: 2023-12-19 — End: 2023-12-25

## 2023-12-25 NOTE — Progress Notes (Signed)
 [facility] Service Palliative Care Note     Reason for visit: Follow-up   Pain management      Diagnosis: MN of urinary bladder     Advanced Care Planning: On File  Surrogate Decision Maker:  Sharlet Bridge, sister  [phone]    Assessment:     Patient Orientation: A&O x 4, answers questions readily and appropriately with recent and remote memories intact    CC:  - pain on both legs   - he would like to take gabapentin    - poor appetite    General:  - he managed the nephrostomy  - his sister Sharlet help manage his medications  - he wants tylenol  to be removed from medication lists  - does not use lidoderm  patch 5% ( it does not stick)  - report the nystatin  swish and swallow holp relieved mouth pain.     Pain Assessment:    Location: neck, lower back, and shoulders  Both legs   Quality: sharp     Pain Medication:   1) dilaudid  2 mg to 4 mg every 4 hours PRN   - taking 1 to 2x/day  2) gabapentin  300 mg 2x/day  3) zanaflex  2 mg every 8 hours as needed    Nausea/vomiting:    - occasional nausea but no vomiting from unknown cause    - he takes compazine  as needed     Medication:   1) compazine  as ordered    Appetite:   - poor appetite with low meal intake. He makes effort to eat.       GI:  Bowel Movements:   - Consistency:  denied constipation       Anxiety/depression:   - he continues to take buspar   - appeared sleepy this visit    Sleep/Fatigue   - report waking up 4x at night to manage the nephrostomy     Medication:   1) trazadone 50 mg as needed, report he takes it sometimes.     Financial challenges: none     Well Being Assessment:   -Patient's needs currently being addressed.  Patient's affect/mood appropriate for situation.     Education: Chief operating officer and verbal instruction provided regarding plan of care.   Barriers to learning assessed: No barriers noted. Patient verbalized understanding of teaching and instructions.     Contact information, AVS given and reviewed related to plan of care. Questions answered  to patient's satisfaction and verbalized understanding. Patient/family aware to call with any questions or concerns that may arise.    Royetta Makos, LVN

## 2023-12-25 NOTE — Progress Notes (Signed)
 [facility] Service Outpatient Progress Note    Requested by Dr. Norman Rattler    CC: Pain, psychosocial support    ID: Stephen [name] is a 73 year old male with a history of metastatic urothelial carcinoma, OA, HTN, MDD, gout referred to Palliative Care clinic for symptom management and psychosocial support.    Interval Events:  -Stephen [name] had an ED visit at [facility] on 12/19/23 for acute cystitis with hematuria, malfunction of nephrostomy tube.  Was given gabapentin  to help with tremors, unclear if pregabalin  was discontinued or not.  Underwent nephrostomy tube exchange.  Braulio had a follow up with Oncology (Dr. Rattler) on 12/21/23, per review of note planned to continue erdafitinib  and planned for XRT of spine    Subjective:    Today we met with patient in palliative care clinic.  We addressed several issues during his visit:     #) Pain: See description below.   Location: Low back, shoulder, neck; also has leg cramping  Quality: Sharp, aching  Severity:   Current pain score: 7 (back, neck)  Worst pain score: 10  Duration/Timing: constant, fluctuating  Modifying factors       Aggravating factors: sitting down, activities, movement, turning head       Alleviating factors: tizanidine , hydromorphone , gabapentin , pregabalin ?    -Not taking acetaminophen  PRN, wants this removed from medication list  -Taking pregabalin  100mg  PO BID although requesting gabapentin  and states he is also taking gabapentin  300mg  PO BID (not taking TID as prescribed).  Clarified to patient that patients normally only take 1 of these 2 medications and we will discontinue the pregabalin  after discussion today  -Takes duloxetine  CR 20mg  PO BID  -Taking hydromorphone  2mg  1-2 tablets (2mg  to 4mg ) PO q4h PRN, reports taking 2 tablets 1-2 times a day  -Not using lidocaine  patches (do not stick well), would still like to have them available PRN  -Taking tizanidine  2mg  PO q8h PRN muscle spasms, last took last night, makes him a little sleepy  -Also  has followed with Pain Management and Orthopedics  -Denies any adverse effects of the hydromorphone , pregabalin , duloxetine  or tizanidine  such as sedation, lethargy, myoclonus or pruritus.  Not currently having falls but in the past has reported issues with frequent falls, which we need to monitor closely.  Also has had some difficulties with mild memory issues, making long acting opioids likely an unsafe choice (patient reports his sister is able to help with medications to some extent but is busy working and taking care of others during the day frequently).  I called his sister Stephen [name] and spoke to her on the phone today, let her know we have prepared a new medication list for Stephen [name].  She states she is preparing all of his medications in a pill box an will change the medications according to the list.  States she keeps a close eye on his well being.  I did let her know he appeared sleepy at times today during appointment which Stephen [name] attributed to sleeping poorly last night    #) Bowel movements:  -Reports having 2-3 BM per day, but states it is formed stools  -Using incontinence pads due to having fecal urgency at times although denies overt incontinence.    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  Not noticing any white discharge on tongue/throat/gums currently, let me know he only needs to take this PRN when oral thrush noted  -Dry mouth causes decreased appetite for him  -Has tried Biotene PRN for dry mouth, doesn't always help, suggested he also try sour candies and lozenges PRN which he is willing to try  -Have discussed with patient that dry mouth may be partly medication induced but he did not want to discontinue any of the medications at this time    #) Insomnia:  -Taking trazodone  50mg  PO nightly PRN, helps but often doesn't sleep well due to needing to wake up and change nephrostomy bag    #) Nephrostomy tube and hx of recurrent UTI symptoms:  -Urine appears clear today, denies noticing any recent hematuria  -No fevers, chills, abdominal pain, flank pain reported today    #) Anxiety:  -Taking Buspar  20mg  PO BID  -Reports ongoing understandable anxiety about his cancer and cancer treatment  -Stopped taking hydroxyzine , states it didn't help with anxiety in the past (may also be worsening dry mouth when has taken this)  -Will avoid stronger anxiolytics such as lorazepam  given hx of memory problems, frequent falls and hx of possible opioid overdose unless assessed to be indicated per Psychiatry  -Seeing Psychiatry, last visit on 10/18/23, have encouraged follow up on ongoing basis    #) Urothelial carcinoma:  -Currently on treatment with erdafitinib , having oral pain and hair loss as potential side effects, otherwise tolerating well    #) Psychosocial domain:  -I called [name]'s sister Stephen [name] and gave her an update about his care plan today and his updated medication list that I am sending him home with.  She states she will make a new pill box for him with the new medication list as a guide.  She is keeping a close eye on him for safety.  She agreed we should hold off on long acting opioids for now given that he has a history of overdose on very low dose  methadone  in the past  -Stephen [name] is still living with his sister Stephen [name]  -Adamantly declines wheelchair, has declined increased home support although recently did accept home health referral with [facility] for RN visit and PT, states they will be setting up appointment this week  -Sister does help him manage medications, Stephen [name] is okay with me calling her as needed to go over his medications    #) Advance care planning and goals of care:  -Health care agent: Stephen [name] Bridge (sister)  -Has advance directive on file from 2019 which is notarized.  -Discussed option of hospice again today.  Stephen [name] has already had several visits from hospice and feels he knows how to contact them when he is ready.  He states that he wants to continue his treatment for now until it is no longer effective and/or causes intolerable side  effects.      ROS: Symptom Assessment      12/21/2023     8:22 AM 11/06/2023    10:00 AM 07/27/2023     5:52 PM 07/20/2023     3:38 PM 07/06/2023    10:14 AM 06/29/2023     9:53 AM 06/26/2023     8:00 AM   Palliative Care Symptom Assessment   Pain Level  5     5   Constipation  7     5   Tiredness  3     3   Nausea  0     0   Depression  6     9   Anxiety  6     9   Drowsiness  3     6   Appetite  1     4   Wellbeing  5     8   Dyspnea  0     0   Weight Loss  4        Insomnia       3   ECOG 3  2 2 2 2         Additional ROS reviewed in detail and negative:  Review of Systems   Constitutional:  Positive for appetite change, fatigue and unexpected weight change. Negative for chills, diaphoresis and fever.   HENT:   Positive for mouth sores and trouble swallowing. Negative for sore throat and voice change.    Eyes:  Negative for icterus.   Respiratory:  Negative for chest tightness, cough, hemoptysis, shortness of breath and wheezing.    Cardiovascular:  Negative for chest pain, leg swelling and palpitations.   Gastrointestinal:  Negative for abdominal pain, blood in stool, constipation, diarrhea, nausea and vomiting.    Genitourinary:  Negative for dysuria, frequency and hematuria.    Musculoskeletal:  Positive for arthralgias, back pain, gait problem and neck pain.   Skin:  Negative for rash.   Neurological:  Positive for extremity weakness and gait problem. Negative for light-headedness and speech difficulty.   Psychiatric/Behavioral:  Positive for sleep disturbance. Negative for suicidal ideas. The patient is nervous/anxious.         Outpatient Palliative Regimen:  Acetaminophen  1000mg  PO q8h PRN pain  (not taking, wants removed from medication list)  Duloxetine  CR 20mg  PO BID  Prochlorperazine  10mg  PO q6h PRN nausea  Trazodone  50mg  PO nightly (taking rarely as needed)  Pregabalin  100mg  PO BID (we will discontinue this today)  Gabapentin  300mg  PO TID (only taking 2 times a day)  Miralax  17gm PO BID PRN constipation (taking once a day)  Senna-S 2 tablets PO BID (taking once a day)  Lidocaine  5% patch 12 hours on / 12 hours off  Narcan  PRN  Tizanidine  2mg  PO q8h PRN muscle spasms  Magic mouthwash PRN  Buspar  20mg  PO BID    Conversion to Oral Morphine  Equivalent = 20 to 40    Allergies & Reactions: Review of patient's allergies indicates Sulfa drugs    Medications: (reviewed today)  Current Outpatient Medications on File Prior to Visit   Medication Sig Dispense Refill    [DISCONTINUED] acetaminophen  (TYLENOL ) 500 MG tablet Take 2 tablets (1,000 mg) by mouth every 8 hours as needed for Mild Pain (Pain Score 1-3) or Moderate Pain (Pain Score 4-6). 40 tablet 0    allopurinol  (ZYLOPRIM ) 100 MG tablet Take 1 tablet (100 mg) by mouth daily. 30 tablet 0    aluminum -magnesium -simethicone  (MAG-AL PLUS)  200-200-20 MG/5ML suspension Mix viscous lidocaine  2%, Mylanta, and Benadryl  liquid into a 1:1:1 solution.  Take 10mL by mouth every 4 hours as needed for pain.  Swish and spit or swallow for pain. If swallowed, wait 10 minutes prior to eating/drinking. May also apply medication to a Q-tip and dab on areas of sores. 355 mL 2    Artificial  Tear Solution (SOOTHE XP OP)       busPIRone  (BUSPAR ) 10 MG tablet Take 2 tablets (20 mg) by mouth 2 times daily. 60 tablet 2    [DISCONTINUED] calcium  acetate (PHOSLO ) 667 MG capsule Take 2 capsules (1,334 mg) by mouth 3 times daily (with meals). 180 capsule 2    diphenhydrAMINE  (BENADRYL ) 12.5 MG/5ML liquid Mix viscous lidocaine  2%, Mylanta, and Benadryl  liquid into a 1:1:1 solution.  Take 10mL by mouth every 4 hours as needed for pain.  Swish and spit or swallow for pain. If swallowed, wait 10 minutes prior to eating/drinking. May also apply medication to a Q-tip and dab on areas of sores. 236 mL 2    DULoxetine  (CYMBALTA ) 20 MG CR capsule Take 1 capsule (20 mg) by mouth 2 times daily. 180 capsule 0    Erdafitinib  3 MG TABS Take 6 mg by mouth daily. 60 tablet 11    fluticasone  propionate (FLONASE ) 50 MCG/ACT nasal spray Spray 2 sprays into each nostril daily. 16 g 2    [DISCONTINUED] gabapentin  (NEURONTIN ) 300 MG capsule Take 1 capsule (300 mg) by mouth 3 times daily.      [DISCONTINUED] HYDROmorphone  (DILAUDID ) 2 MG tablet Take 1 to 2 tablets (2mg  to 4mg ) by mouth every 4 hours as needed for moderate to severe pain 90 tablet 0    ketotifen  (ALAWAY ) 0.025 % ophthalmic solution Place 1 drop into both eyes 2 times daily.      lidocaine  (LIDODERM ) 5 % patch Apply 1 patch topically every 24 hours. Leave patch on for 12 hours, then remove for 12 hours. 30 patch 2    lidocaine  (XYLOCAINE ) 2 % solution Mix viscous lidocaine  2%, Mylanta, and Benadryl  liquid into a 1:1:1 solution.  Take 10mL by mouth every 4 hours as needed for pain.  Swish and spit or swallow for pain. If swallowed, wait 10 minutes prior to eating/drinking. May also apply medication to a Q-tip and dab on areas of sores. 200 mL 2    Multiple Vitamins-Minerals (MULTIVITAMIN WITH MINERALS) TABS tablet Take 1 tablet by mouth daily. 30 tablet 1    naloxone  (NARCAN ) 4 mg/0.1 mL nasal spray For suspected opioid overdose, call 911! Then spray once in one  nostril. Repeat after 3 minutes if no or minimal response using a new spray in other nostril. 2 each 0    Nutritional Supplements (NEPRO) LIQD Take 3 Cans by mouth daily. 30000 mL 11    nystatin  (MYCOSTATIN ) 100,000 units/mL suspension 5 mL (500,000 Units) by Swish & Swallow route 4 times daily. 280 mL 0    polyethylene glycol (GLYCOLAX ) 17 GM/SCOOP powder Take 17 grams (1 capful) by mouth daily. Mix with 4 to 8 ounces of fluid (water , juice, soda, coffee, or tea) prior to administration. 238 g 0    [DISCONTINUED] pregabalin  (LYRICA ) 100 MG capsule Take 1 capsule (100 mg) by mouth 2 times daily. 60 capsule 0    prochlorperazine  (COMPAZINE ) 10 MG tablet Take 1 tablet (10 mg) by mouth every 6 hours as needed (Nausea/Vomiting). 90 tablet 2    senna (SENOKOT) 8.6 MG tablet Take 2 tablets (  17.2 mg) by mouth at bedtime. 60 tablet 3    tizanidine  (ZANAFLEX ) 2 MG tablet Take 1 tablet (2 mg) by mouth every 8 hours as needed (muscle spasm). 30 tablet 2    traZODone  (DESYREL ) 50 MG tablet Take one tab nightly by mouth at bedtime as needed for insomnia 90 tablet 0     No current facility-administered medications on file prior to visit.       Past Medical History:  Past Medical History:   Diagnosis Date    Chronic back pain     Congenital hydronephrosis     Gout     Headache     Hematuria     HTN (hypertension) 12/10/2021    Kidney disease     Kidney stones     Major depressive disorder, single episode     Polyarthropathy or polyarthritis of multiple sites     Retinal detachment     Urethral stricture        Past Surgical History:  Past Surgical History:   Procedure Laterality Date    CT INSERTION OF SUPRAPUBIC CATH  09/25/2015    NEPHRECTOMY Right 1955    APPENDECTOMY      COLONOSCOPY      CYSTOSCOPY      CYSTOSCOPY W/ LASER LITHOTRIPSY      OTHER SURGICAL HISTORY      Interstim 01/29/2011    SPINE SURGERY  09/21    Lumbar-sacral fusion    TRANSURETHRAL RESECTION OF PROSTATE         Social History:  Healthcare agent/surrogate  decision-maker: Stephen [name] Bridge (sister)  Social History     Social History Narrative    Not on file        Family History:  Family History   Adopted: Yes   Family history unknown: Yes       PEX:  Vitals:    12/25/23 0817   BP: 107/68   BP Location: Left arm   BP Patient Position: Sitting   BP cuff size: Regular   Pulse: 74   Resp: 16   Temp: 98 F (36.7 C)   TempSrc: Temporal   SpO2: 98%   Weight: 70.9 kg (156 lb 6.4 oz)   Height: 5' 6.14 (1.68 m)       General: No acute distress, thin, sitting in chair  HEENT: PERRLA, EOMI, no conjunctivitis or scleral icterus noted.     Neck:  Trachea midline.  Lungs: Clear to auscultation bilaterally, no wheezes, no rhonchi, no rales  CV: Regular rate and rhythm, no murmurs, no rubs, no gallops  Abdomen: Soft, nondistended, nontender to palpation, no rebound or guarding, normoactive bowel sounds in all four quadrants, no CVAT, +nephrostomy tube noted with clear urine in bag on L side  Neuro: Oriented x3, no dysarthria, moving all four extremities spontaneously, 5/5 strength throughout.  Still having some noticeable memory issues at times  although this is actually improved today   Psych: Thought process linear and goal-directed, thought content appropriate, affect full range and appropriate, denies suicidal ideation, reports understandable anxiety about his cancer and ongoing cancer treatment  Skin: Warm and well perfused.  Brisk capillary refill.      Pertinent Labs:     Lab Results   Component Value Date    WBC 3.5 (L) 12/03/2023    RBC 2.92 (L) 12/03/2023    HGB 8.8 (L) 12/03/2023    HCT 27.2 (L) 12/03/2023    MCV 93.2 12/03/2023    MCHC  32.4 12/03/2023    RDW 14.6 (H) 12/03/2023    PLT 139 (L) 12/03/2023    MPV 10.7 12/03/2023       Lab Results   Component Value Date    BUN 20 12/04/2023    CREAT 1.25 (H) 12/04/2023    CL 105 12/04/2023    NA 143 12/04/2023    K 3.3 (L) 12/04/2023    CA 8.8 12/04/2023    TBILI 1.36 (H) 11/29/2023    ALB 2.7 (L) 11/29/2023    TP 5.2 (L)  11/29/2023    AST 20 11/29/2023    ALK 93 11/29/2023    BICARB 30 (H) 12/04/2023    ALT 22 11/29/2023    GLU 123 (H) 12/04/2023       Above labs reviewed    Pertinent Diagnostic Study Findings:  MRI Spine Survey w/wo contrast 12/02/23  IMPRESSION:  Patient motion and lumbar hardware susceptibility artifact degrades assessment. Within these limitations:     Redemonstrated postsurgical changes related to L2-S1 posterior instrumented fusion, posterior L2-L5 decompression, and L5-S1 anterior fixation. Interbody cages are again seen from L2-L3 through L5-S1. Stable size of a 6.2 x 2.4 cm (SI by AP) collection within the L3/L4 posterior surgical bed. Please note sterility of the collection cannot be determined by MRI.     Interval increase diffuse osseous metastatic disease within the visualized bones, including the spine, partially imaged ribs and sacrum. No new pathologic fractures identified. Previously noted epidural extension within the midthoracic spine is difficult to delineate from vascular enhancement, though this may be increased within the midthoracic neural foramina. No convincing epidural extension into the bony spinal canal, though again this is difficult to definitively evaluate due to venous epidural enhancement (particularly dorsally at T4).     Minimal enhancement along the cauda equina nerve roots at the L5-S1 levels on axial imaging is favored to be vascular, though a leptomeningeal processes not completely excluded with certainty.     Otherwise, no convincing suspicious intrathecal, leptomeningeal, intramedullary, or epidural enhancement at evaluable levels, noting poor assessment at postsurgical levels due to susceptibility artifact from spinal hardware.     Of note, no specific findings of osteomyelitis-discitis or epidural abscess. However, there is increased marrow edema due to progressive osseous metastasis, which makes superimposed infection difficult to exclude.     Redemonstrated degenerative  findings with moderate canal stenosis at C4-C5 and C5-C6. Otherwise, no more than mild canal stenosis. No cord or cauda equina nerve root compression.     Evaluation of the neural foramina of the lumbar spine is limited due to susceptibility artifact from hardware, however there is no convincing high-grade foraminal stenosis of the lumbar spine. Probable moderate foraminal stenosis on the right at L1-L2 and possibly bilaterally at L4-L5, poorly characterized. Severe foraminal stenosis on the right at T2-T3 and possibly on the right at C7-T1, poorly characterized. Moderate-to-severe foraminal stenosis on the right at T1-T2 and T10-T11. Other scattered levels of up-to-moderate thoracic foraminal stenosis.    US  Lower Extremity DVT 11/28/23  IMPRESSION:  No DVT in the bilateral lower extremity.    CT Abdomen/Pelvis without contrast 11/28/23  IMPRESSION:  Interval retraction of the left nephrostomy tube, now terminating in the inferior pole cortex. This contributes to new moderate left hydronephrosis with superimposed perinephric/periureteral stranding, nonspecific but may represent inflammation/infection. Recommend consultation with interventional radiology for consideration of repositioning     Consolidative left basilar opacities, likely infectious/inflammatory..     Large rectal stool ball without definite evidence  of stercoral colitis. Consider disimpaction.     Stable osseous and retroperitoneum metastatic disease.     Note that assessment of solid organs is limited in the absence of intravenous contrast.    PET/CT 11/13/23  IMPRESSION:  1. Compared to prior PET-CT 07/19/23 there has been overall interval progression of previous thoracic, mediastinal and abdominal lymph adenopathy.  2. Although there has been interval improvement/resolution of previous multiple lesions in the sternum, spine and pelvis, there are multiple new lesions in L4, right posterior L2, and left lateral 4th and 7th ribs.    Above findings  reviewed    ASSESSMENT: Stephen [name] is a 73 year old male with a history of metastatic urothelial carcinoma, OA, HTN, MDD, gout referred to Palliative Care clinic for symptom management and psychosocial support.    Medical chart reviewed in detail.  Above labs reviewed and notable for leukopenia, anemia, thrombocytopenia, elevated creatinine (Cr 1.25), mild hypokalemia, elevated total bilirubin, low albumin , low total protein   Above imaging reviewed personally and notable for recent MRI Spine with diffuse osseous metastatic disease in the spine, ribs and sacrum; moderate canal stenosis at C4-C5 and C5-C6 with degenerative findings; recent DVT US  with no DVT in bilateral LE; recent CT Abdomen/Pelvis with retraction of L nephrostoy tube, consolidative left basilar opacities, large rectal stool ball, stable osseous and retroperitoneal metastatic disease; recent ET/CT with progression of thoracic, mediastinal and abdominal lymphadenopathy with multiple new lesions in L4, right posterior L2, and left lateral 4th and 7th ribs       Diagnoses and all orders for this visit:    Metastatic urothelial carcinoma (CMS-HCC)    Metastasis to bone (CMS-HCC)    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  -     HYDROmorphone  (DILAUDID ) 2 MG tablet; Take 1 to 2 tablets (2mg  to 4mg ) by mouth every 4 hours as needed for moderate to severe pain    Cancer related pain  -     gabapentin  (NEURONTIN ) 300 MG capsule; Take 1 capsule (300 mg) by mouth 3 times daily.  -     HYDROmorphone  (DILAUDID ) 2 MG tablet; Take 1 to 2 tablets (2mg  to 4mg ) by mouth every 4 hours as needed for moderate to severe pain    Cervical radiculopathy    Neuropathic pain    Dry mouth    Mucositis    Therapeutic opioid induced constipation    At risk for nausea    Insomnia, unspecified type    Anxiety    History of fall    Nephrostomy  status (CMS-HCC)    Palliative care by specialist          PLAN:  - continue hydromorphone  to 2mg  2 tablets (4mg ) PO q4h PRN severe pain.  Patient has a history of opioid overdose, recommend avoiding long acting opioids at this time due to concern for memory issues.  Given his increased pain today I did call his sister to check in who agrees we should avoid long acting opioids for now, but she will call us  if anything changes.  If pain not relieved with taking hydromorphone  around the clock at this dose, may consider Fentanyl  patch if patient's sister willing to help him manage it safely  - discontinue acetaminophen  500mg  to 1000mg  PO q8h PRN pain per patient request  - continue  duloxetine  CR 20mg  PO BID to target pain and anxiety, monitor carefully for signs/symptoms of serotonin syndrome  - discontinue pregabalin  100mg  PO BID as patient would prefer to take gabapentin   - continue gabapentin  300mg  PO TID  - continue tizanidine  at dose of 2mg  PO q8h PRN muscle spasms.    - continue lidocaine  5% patches 12 hours on / 12 hours off PRN pain  - continue magic mouthwash PRN  - continue Nystatin  mouthwash per patient request for oral thrush when it occurs  - have discontinued hydroxyzine , holding this right now as patient is at high risk of falls and he also did not feel it was helpful  - continue Buspar  20mg  PO BID  - CURES reviewed  - Narcan  PRN prescribed at previous visit and we discussed how/when to use in emergencies.  Confirmed that patient has this at home.  - continue Senna 2 tablets PO nightly  - continue Miralax  17gm PO QAM  - continue prochlorperazine  10mg  PO q6h PRN nausea  - continue trazodone  50mg   PO nightly PRN insomnia.   - continue follow up with Pain Management  - Continue Nutrition follow up  - Continue Psychiatry follow up  - Referred to Memory Clinic to address chronic memory issues but noted there is no appointment today  - Patient adamantly declines I[facility]SS or home based palliative care but now open  to home health PT and RN visits (pending with [facility])  - hospice referral placed for informational visit only, patient has met with hospice and will contact hospice when ready to start services.  - please mychart message or call the palliative care clinic regarding symptom management    Follow up in palliative care clinic on 01/22/24 at 10 AM in person at [facility] (60 minutes)     Patient evaluated with the following [name] Service Members:  Royetta Makos, LVN    Thank you for your consult.  We will continue to follow this patient and provide longitudinal care, symptom management and psychosocial support.    Lauraine Elvie Sar, MD  Assistant Professor  [facility] Palliative Care Service  [email or web address]    Total Attending time 60 min:  55 min spent performing physical examination, counseling patient, and coordinating their care with medical team.

## 2023-12-25 NOTE — Patient Instructions (Addendum)
 [facility] Palliative Care       PLAN OF CARE:    -Continue hydromorphone  to 2mg  1 to 2 tablets (2mg  to 4mg ) by mouth every 4 hours as needed for pain  -Continue lidocaine  5% patches as needed for pain.  Wear for 12 hours and take off for 12 hours each day when you use these  -Continue duloxetine  CR (Cymbalta ) 20mg  by mouth 2 times a day  -DISCONTINUE pregabalin  (Lyrica ) 100mg  by mouth 2 times a day  -Continue taking gabapentin  300mg  1  capsule by mouth 3 times a day  -Continue Senna 2 tablets by mouth at bedtime  -Continue Miralax  1 tablespoon in water  daily  -Continue Buspar  20mg  by mouth 2 times a day  -Continue prochlorperazine  10mg  by mouth every 6 hours as needed for nausea  -Continue follow up with Psychiatry     We will see you for follow up on 01/22/24 at 10 AM in person at [place] (60 minutes)    List of Palliative Care Providers:  Vonita Blas, MD   Delon Shin, NP  Recardo Colorado, NP  Lauraine Sar, MD  Rickey Floor, NP  Powell Stanford, NP  Ozell Fredrickson, MD  Emmie Notch, MD  Corean Mustard MD  Warrick Ar, DO FAAHPM  Lamarr Pang, MD      Administrative Assistant/Team Coordinator   Janina Sprung   Sari Flies     Social Workers:   Rayfield Rama, LCSW  [place]  Panagiotou, LCSW    Spiritual Counselor/Chaplain  Isaiah Early, [facility]   Mylinda Groves, [place] CME     Nurse Case Managers  Mozell Chain, RN   Arland Decent, RN  Reva Ancil, RN   Royetta Makos, LVN  Joan Bihari, [place]  .............................................................................................................................................      HOW TO CONTACT PALLIATIVE CARE  NURSE LINE--------[phone]--leave a voicemail and we will call you back  SCHEDULING------[phone]  FAX--------------------[phone]   [place]------Use MyChart Messaging for non-emergent issues  ?   HOURS: Monday through Friday 8:00 am - 5:00 pm?(closed holidays and weekends)      AFTER HOURS: For urgent symptom issues after  hours, call [phone] and ask for the Doctor-on-Call for Palliative Care.   ?   Emergency: Call 911 or go to the nearest emergency room.?   .............................................................................................................................................   ?  [place] Help Desk: [phone]      ............................................................................................................................................      Special Instructions Regarding Medications:   ?   1. Prescription Refills       -4 days before running out of your medication, NOTIFY us                  Use [phone] or via MyChart message.                 We can Mail you palliative care prescriptions if you like, they are managed by the [facility] mail order pharmacy                Please let us  know 4 business days in advance of running out    Mail order refills are ONLY delivered Monday-Friday (no holidays)     We will need to know the delivery address     Someone will need to be home to sign for the medication (will need to show drivers license and sign for delivery)    2. -Pain medication Agreement                Pain medications to treat cancer-relate pain should only  be prescribed by [facility] palliative care unless you are in the ER               We must see you regularly to assess you and safely prescribe               If you miss an appointment or need to cancel/reschedule please call us  right away at [phone]  ?   2. Storage                Medicines should be stored in the original bottles in a cool, dry place.                 Please keep them securely out of reach of children and pets.   ?   3. Disposal: Please dispose of your medicines safely. Here is a link to safe disposal sites in [place] : http://trevino.com/.php    ?   4. Precaution: Please keep all medications out of reach of children.   ?    .............................................................................................................................................?   Financial Counselors  [phone] [facility]  [phone] or [phone]     Advance Care Planning   ?   Many patients ask about the differences between an Advance Directive and a POLST.       Here is a lobbyist that clarifies the differences between these documents:    [email or web address] ?      Differences between an Advance Directive and a POLST are:    An Advance Directive is for:    1. Anyone 51 years of age or older    2. Provides instructions for future treatment    3. Appoints a health care representative    4. Guides inpatient treatment decisions when make available       A POLST is for:    1. Anyone with a serious illness - any age   2. Provides medical orders for current treatment    3. Guides actions by Emergency Medical Personnel when made available    4. Guides inpatient treatment decisions when made available   5. This is a bright pink form that should be placed on your refrigerator    .............................................................................................................................................      Patient Experience Department   ?   The Patient Experience Department acts as a bridge between our patients, hospitals and physicians to respond to concerns, issues and requests. Patient experience specialists are here to help patients and their families communicate their experiences with [facility] and navigate our health system. Please call [phone] or send an email to [email or web address].    ..............................................[name]     MyPath     We are excited to share our new [facility] Cancer Services application: MyPath. MyPath will guide you through your unique cancer journey and connect you with our expansive network of  curated support services and resources. It is available in English and Spanish.     You can download the MyPath application in four easy steps:     1.       Use the QR code to open your app store.  2.       Click on the MyPath app icon and install on your device.  3.       Open the MyPath app.  4.       Enter your [place] username and password to log in.

## 2023-12-26 ENCOUNTER — Telehealth (HOSPITAL_BASED_OUTPATIENT_CLINIC_OR_DEPARTMENT_OTHER): Payer: Self-pay

## 2023-12-26 NOTE — Telephone Encounter (Signed)
Called pt, left message re: MCVV scheduled 01/19/24 at 2:30pm.

## 2023-12-26 NOTE — Telephone Encounter (Signed)
Called patient to confirm the following appointment.    Procedure:Nephrostomy tube change  Date:03/27  Check in Time: 1000  Procedure Start time:1100  Location: [facility]  Prep:N/A  Driver: N/A

## 2023-12-27 ENCOUNTER — Encounter (HOSPITAL_BASED_OUTPATIENT_CLINIC_OR_DEPARTMENT_OTHER): Payer: Self-pay | Admitting: Hematology & Oncology

## 2023-12-27 ENCOUNTER — Other Ambulatory Visit: Payer: Self-pay

## 2023-12-27 NOTE — Progress Notes (Signed)
Refill Reminder    Indication: ONCOLOGY/HEMATOLOGY  Medication: ONCOLOGY MED balversa    Methods of HIPAA Verification (select 2): Name and DOB    Changes Since Last Visit: None    Is patient ready to refill? Yes, refill initiated in [place].      Has the patient reported experiencing any of the following? N/A    Medication Adherence    Patient reported X missed doses in the last month: 0  Any gaps in refill history greater than 2 weeks in the last 3 months: no  Demonstrates understanding of importance of adherence: yes  Informant: patient  Reliability of informant: reliable  Adherence tools used: calendar  Support network for adherence: healthcare provider  Confirmed plan for next specialty medication refill: pick-up at pharmacy  Refills needed for supportive medications: not needed       Greysin Jozwiak has 0 day(s) of medication on hand.  Has Atharv Assis started or stopped any medications (includes Rx, OTC, herbal supplements) since last refill? no  Please confirm the following: Pick up date [phone]    Patient Support    Was any additional patient support provided?: No, patient does not need extra support at this time.         Next scheduled lab draw: none  Next scheduled follow up with provider: none  Patient's current quality of life? (1=poor, 10=excellent) N/A  Does patient need to speak with pharmacist? No intervention necessary.

## 2023-12-27 NOTE — Telephone Encounter (Signed)
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    Pt reports:  - painful rash   Onset: Monday and worsening   Unknown cause   Moderate pain, severely painful with palpation   Took 4 mg of dilaudid   Gabapentin     Nothing is helping with pain  Warm to touch   Pruritus   Erythema     Denies: nausea, vomiting, diarrhea, fever or chills.     Discussed with Franco Nones plan/Recommendations per conversation:    Urgent care evaluation- notes that he will need to stay local as he is having trouble with transportation. RN called [facility] urgent care they accept his insurance- RN offered contact information pt declines stating that the office has poor reputation. Pt states he may elect to go to ED in [place] to night      Offered same day clinic/ appt tomorrow - pt declined stating that he is unable to get up to  in [facility]  ED precautions provided   Advised to call back should he need further assistance   Patient was appreciative of call.

## 2023-12-29 ENCOUNTER — Telehealth (HOSPITAL_BASED_OUTPATIENT_CLINIC_OR_DEPARTMENT_OTHER): Payer: Self-pay

## 2023-12-29 ENCOUNTER — Ambulatory Visit
Admission: RE | Admit: 2023-12-29 | Discharge: 2023-12-29 | Disposition: A | Discharge status: Home / Self Care | Payer: Medicare Other | Attending: Radiation Oncology | Admitting: Radiation Oncology

## 2023-12-29 ENCOUNTER — Other Ambulatory Visit (HOSPITAL_BASED_OUTPATIENT_CLINIC_OR_DEPARTMENT_OTHER): Payer: Self-pay

## 2023-12-29 DIAGNOSIS — C7951 Secondary malignant neoplasm of bone: Secondary | ICD-10-CM | POA: Insufficient documentation

## 2023-12-29 DIAGNOSIS — C689 Malignant neoplasm of urinary organ, unspecified: Secondary | ICD-10-CM | POA: Insufficient documentation

## 2023-12-29 NOTE — Telephone Encounter (Signed)
 Artis Flock: [phone] F: [phone] GNFAO#[id number] completed and faxed back. Fax confirmation received.

## 2024-01-01 ENCOUNTER — Telehealth (INDEPENDENT_AMBULATORY_CARE_PROVIDER_SITE_OTHER): Payer: Self-pay

## 2024-01-01 ENCOUNTER — Other Ambulatory Visit (HOSPITAL_BASED_OUTPATIENT_CLINIC_OR_DEPARTMENT_OTHER): Payer: Self-pay | Admitting: Vascular & Interventional Radiology

## 2024-01-01 ENCOUNTER — Telehealth (INDEPENDENT_AMBULATORY_CARE_PROVIDER_SITE_OTHER): Payer: Self-pay | Admitting: Nephrology

## 2024-01-01 DIAGNOSIS — N1330 Unspecified hydronephrosis: Secondary | ICD-10-CM

## 2024-01-01 NOTE — Telephone Encounter (Signed)
 Returned call to [place] from Stephen [name]. Per chart review, Pt was seen virtually by Dr. Jeanell on 12/22/23 and was recommended to follow up with IR re: tube leakage.     Per dept, nephrology tubes are managed by IR, pt will need to direct to them for management. Shelly does not believe patient has contacted IR for this.     Info relayed to [facility] and provided IR phone number:  IR [phone]     She will call IR to discuss further.     Closing encounter.

## 2024-01-01 NOTE — Telephone Encounter (Signed)
 Ask of provider/APP: Please sign pended order for IR acute L nephrostomy tube check/change    Outbound call to patient's family member and confirmed with 2 patient identifiers.      Reason for call:  Pt with leaking from nephrostomy tube site x 3 days.  Denies fever, redness, swelling or drainage, n/v.  Continues to drain into drainage bag.  Positive for pain at site (hard for patient to distinguish as also with arthritic pain in that area) and leaking of urine at tube site.     Plan: Discussed red flag symptoms (fever or worsening pain)/ ED precautions in interim.  Will add to PF for tube check/change and route to APP team for review and signature      Date of Procedure: 12/19/2023  Procedure Type: New 12 French nephrostomy tube in the left renal pelvis.   Provider: [facility]     Last changed at [facility] was 11/29/2023    Current Associated Symptoms:  [] Fever  [x] Chills  [x] Pain - pt with back pain in that area so hard to distinguish if flank pain.   [] Redness [] Swelling [] Drainage  [x] Leaking Tube [x] No Output [] Nausea [] Vomiting      Medication Review:   Patient is taking:     MyChart Photo:     *Patient/family member notified can take up to 24 hrs turnaround time for provider response.

## 2024-01-01 NOTE — Telephone Encounter (Signed)
 Patient is calling complaining of symptoms. Patient is requesting medical advice.     Cherrie calling from [facility] home [facility] - She visited patient today and noticed that :     Who is calling (name +ph#):       Please call patient first - [phone]  If patient doesn't answer- Call sister Stephen [name] Stephen [name]   [facility]. [phone]     Procedure: Nephrostomy tube     What are the symptoms?     *Patient's Nephrostomy tube has been learking for 3 days-   *pain level 10/10- He took pain medicaiton at 11am  *Patient is find it diffivult to stand   *Feeling Weak       Pain level 0-10: 10/10    When did the symptoms start?  3 days ago     Where is the pain located? Top of his tube    Is there bleeding? No bleeding

## 2024-01-01 NOTE — Telephone Encounter (Signed)
 Outbound call to schedule:    Procedure: nephrostomy tube check/change  Location: [facility]  Date/time: 01/04/2024 at 0900   Check-in time: 0800     Prep: none   Driver:  no    Scheduled and confirmed with patient.

## 2024-01-01 NOTE — Telephone Encounter (Signed)
 Shiley from [facility] nurse called in stating patient NEPHROSTOMY TUBE is leaking and the site is wet. Shiley stated she was dressing the tube and patient is in a lot of pain 10/10. Shiley is requesting a callback to discuss further.      Please assist ,thank you     CB# [phone]   Or Pts #  [phone]

## 2024-01-02 ENCOUNTER — Emergency Department (HOSPITAL_COMMUNITY): Payer: Medicare Other

## 2024-01-02 ENCOUNTER — Ambulatory Visit (HOSPITAL_BASED_OUTPATIENT_CLINIC_OR_DEPARTMENT_OTHER): Payer: Self-pay | Admitting: Community Health

## 2024-01-02 ENCOUNTER — Inpatient Hospital Stay
Admission: EM | Admit: 2024-01-02 | Discharge: 2024-01-10 | DRG: 543 | Disposition: A | Discharge status: Home Health | Payer: Medicare Other | Attending: Internal Medicine | Admitting: Internal Medicine

## 2024-01-02 DIAGNOSIS — N1330 Unspecified hydronephrosis: Secondary | ICD-10-CM | POA: Insufficient documentation

## 2024-01-02 DIAGNOSIS — F329 Major depressive disorder, single episode, unspecified: Secondary | ICD-10-CM | POA: Diagnosis present

## 2024-01-02 DIAGNOSIS — Z515 Encounter for palliative care: Secondary | ICD-10-CM

## 2024-01-02 DIAGNOSIS — K5903 Drug induced constipation: Secondary | ICD-10-CM | POA: Diagnosis present

## 2024-01-02 DIAGNOSIS — C642 Malignant neoplasm of left kidney, except renal pelvis: Secondary | ICD-10-CM

## 2024-01-02 DIAGNOSIS — C7951 Secondary malignant neoplasm of bone: Principal | ICD-10-CM | POA: Diagnosis present

## 2024-01-02 DIAGNOSIS — Z936 Other artificial openings of urinary tract status: Secondary | ICD-10-CM

## 2024-01-02 DIAGNOSIS — Z7409 Other reduced mobility: Principal | ICD-10-CM

## 2024-01-02 DIAGNOSIS — M5416 Radiculopathy, lumbar region: Secondary | ICD-10-CM | POA: Diagnosis present

## 2024-01-02 DIAGNOSIS — C779 Secondary and unspecified malignant neoplasm of lymph node, unspecified: Secondary | ICD-10-CM | POA: Diagnosis present

## 2024-01-02 DIAGNOSIS — I10 Essential (primary) hypertension: Secondary | ICD-10-CM | POA: Diagnosis present

## 2024-01-02 DIAGNOSIS — M533 Sacrococcygeal disorders, not elsewhere classified: Secondary | ICD-10-CM | POA: Diagnosis present

## 2024-01-02 DIAGNOSIS — M79652 Pain in left thigh: Secondary | ICD-10-CM

## 2024-01-02 DIAGNOSIS — C678 Malignant neoplasm of overlapping sites of bladder: Secondary | ICD-10-CM

## 2024-01-02 DIAGNOSIS — M79662 Pain in left lower leg: Secondary | ICD-10-CM

## 2024-01-02 DIAGNOSIS — D630 Anemia in neoplastic disease: Secondary | ICD-10-CM | POA: Diagnosis present

## 2024-01-02 DIAGNOSIS — R339 Retention of urine, unspecified: Secondary | ICD-10-CM | POA: Diagnosis present

## 2024-01-02 DIAGNOSIS — G893 Neoplasm related pain (acute) (chronic): Principal | ICD-10-CM | POA: Diagnosis present

## 2024-01-02 DIAGNOSIS — Z79899 Other long term (current) drug therapy: Secondary | ICD-10-CM

## 2024-01-02 DIAGNOSIS — Q620 Congenital hydronephrosis: Secondary | ICD-10-CM

## 2024-01-02 DIAGNOSIS — G4700 Insomnia, unspecified: Secondary | ICD-10-CM | POA: Diagnosis present

## 2024-01-02 DIAGNOSIS — M7732 Calcaneal spur, left foot: Secondary | ICD-10-CM

## 2024-01-02 DIAGNOSIS — R262 Difficulty in walking, not elsewhere classified: Secondary | ICD-10-CM

## 2024-01-02 DIAGNOSIS — Z9049 Acquired absence of other specified parts of digestive tract: Secondary | ICD-10-CM

## 2024-01-02 DIAGNOSIS — R64 Cachexia: Secondary | ICD-10-CM | POA: Diagnosis present

## 2024-01-02 DIAGNOSIS — Z6823 Body mass index (BMI) 23.0-23.9, adult: Secondary | ICD-10-CM

## 2024-01-02 DIAGNOSIS — M25552 Pain in left hip: Secondary | ICD-10-CM

## 2024-01-02 DIAGNOSIS — E440 Moderate protein-calorie malnutrition: Secondary | ICD-10-CM | POA: Diagnosis present

## 2024-01-02 DIAGNOSIS — Z9079 Acquired absence of other genital organ(s): Secondary | ICD-10-CM

## 2024-01-02 DIAGNOSIS — C689 Malignant neoplasm of urinary organ, unspecified: Secondary | ICD-10-CM | POA: Diagnosis present

## 2024-01-02 DIAGNOSIS — Z905 Acquired absence of kidney: Secondary | ICD-10-CM

## 2024-01-02 DIAGNOSIS — N139 Obstructive and reflux uropathy, unspecified: Secondary | ICD-10-CM | POA: Diagnosis present

## 2024-01-02 DIAGNOSIS — T402X5A Adverse effect of other opioids, initial encounter: Secondary | ICD-10-CM | POA: Diagnosis present

## 2024-01-02 DIAGNOSIS — M109 Gout, unspecified: Secondary | ICD-10-CM | POA: Diagnosis present

## 2024-01-02 DIAGNOSIS — Z882 Allergy status to sulfonamides status: Secondary | ICD-10-CM

## 2024-01-02 DIAGNOSIS — R269 Unspecified abnormalities of gait and mobility: Secondary | ICD-10-CM | POA: Diagnosis present

## 2024-01-02 DIAGNOSIS — Z923 Personal history of irradiation: Secondary | ICD-10-CM

## 2024-01-02 DIAGNOSIS — Z85528 Personal history of other malignant neoplasm of kidney: Secondary | ICD-10-CM

## 2024-01-02 LAB — SED RATE, BLOOD: Sed Rate: 101 mm/h — ABNORMAL HIGH (ref 2–37)

## 2024-01-02 LAB — ECG 12-LEAD
ATRIAL RATE: 71 {beats}/min
P AXIS: 68 degrees
PR INTERVAL: 190 ms
QRS INTERVAL/DURATION: 96 ms
QT: 400 ms
QTc (Bazett): 434 ms
QTc (Fredericia): 423 ms
R AXIS: 26 degrees
T AXIS: 18 degrees
VENTRICULAR RATE: 71 {beats}/min

## 2024-01-02 LAB — CBC WITH DIFF, BLOOD
ANC-Automated: 6 10*3/uL (ref 1.6–7.0)
Abs Basophils: 0 10*3/uL (ref <0.2)
Abs Eosinophils: 0 10*3/uL (ref 0.0–0.5)
Abs Lymphs: 1.1 10*3/uL (ref 0.8–3.1)
Abs Monos: 0.7 10*3/uL (ref 0.2–0.8)
Basophils: 0.3 %
Eosinophils: 0.5 %
Hct: 28.8 % — ABNORMAL LOW (ref 40.0–50.0)
Hgb: 9.8 g/dL — ABNORMAL LOW (ref 13.7–17.5)
Imm Gran %: 0.4 % (ref <1)
Imm Gran Abs: 0 10*3/uL (ref <0.1)
Lymphocytes: 14.1 %
MCH: 31.2 pg (ref 26.0–32.0)
MCHC: 34 g/dL (ref 32.0–36.0)
MCV: 91.7 um3 (ref 79.0–95.0)
MPV: 10.2 fL (ref 9.4–12.4)
Monocytes: 9 %
Plt Count: 192 10*3/uL (ref 140–370)
RBC: 3.14 10*6/uL — ABNORMAL LOW (ref 4.60–6.10)
RDW: 13.9 % (ref 12.0–14.0)
Segs: 75.7 %
WBC: 7.9 10*3/uL (ref 4.0–10.0)

## 2024-01-02 LAB — COMPREHENSIVE METABOLIC PANEL, BLOOD
ALT (SGPT): 13 U/L (ref 0–41)
AST (SGOT): 20 U/L (ref 0–40)
Albumin: 3.4 g/dL — ABNORMAL LOW (ref 3.5–5.2)
Alkaline Phos: 104 U/L (ref 40–129)
Anion Gap: 15 mmol/L (ref 7–15)
BUN: 22 mg/dL (ref 8–23)
Bicarbonate: 28 mmol/L (ref 22–29)
Bilirubin, Tot: 1.27 mg/dL — ABNORMAL HIGH (ref <1.2)
Calcium: 10.2 mg/dL (ref 8.5–10.6)
Chloride: 93 mmol/L — ABNORMAL LOW (ref 98–107)
Creatinine: 1.11 mg/dL (ref 0.67–1.17)
Glucose: 109 mg/dL — ABNORMAL HIGH (ref 70–99)
Potassium: 4.1 mmol/L (ref 3.5–5.1)
Sodium: 136 mmol/L (ref 136–145)
Total Protein: 6.6 g/dL (ref 6.0–8.0)
eGFR Based on CKD-EPI 2021 Equation: >60 mL/min/{1.73_m2}

## 2024-01-02 LAB — C-REACTIVE PROTEIN, BLOOD: CRP: 20.43 mg/dL — ABNORMAL HIGH (ref <0.5)

## 2024-01-02 LAB — BILIRUBIN, DIR BLOOD: Bilirubin, Dir: 0.3 mg/dL — ABNORMAL HIGH (ref <=0.2)

## 2024-01-02 MED ORDER — TRAZODONE HCL 50 MG OR TABS
50.0000 mg | ORAL_TABLET | Freq: Every evening | ORAL | Status: DC | PRN
Start: 2024-01-02 — End: 2024-01-10
  Administered 2024-01-02 – 2024-01-09 (×6): 50 mg via ORAL
  Filled 2024-01-02 (×6): qty 1

## 2024-01-02 MED ORDER — THERA M PLUS OR TABS
1.0000 | ORAL_TABLET | Freq: Every day | ORAL | Status: DC
Start: 2024-01-02 — End: 2024-01-10
  Administered 2024-01-02 – 2024-01-10 (×9): 1 via ORAL
  Filled 2024-01-02 (×9): qty 1

## 2024-01-02 MED ORDER — NYSTATIN 100000 UNIT/ML MT SUSP
5.0000 mL | Freq: Four times a day (QID) | OROMUCOSAL | Status: DC
Start: 2024-01-02 — End: 2024-01-07
  Administered 2024-01-02 – 2024-01-07 (×17): 500000 [IU] via ORAL
  Filled 2024-01-02 (×21): qty 5

## 2024-01-02 MED ORDER — POLYETHYLENE GLYCOL 3350 OR PACK
17.0000 g | PACK | Freq: Every day | ORAL | Status: DC
Start: 2024-01-02 — End: 2024-01-08
  Administered 2024-01-02 – 2024-01-08 (×4): 17 g via ORAL
  Filled 2024-01-02 (×7): qty 1

## 2024-01-02 MED ORDER — BUSPIRONE HCL 10 MG OR TABS
20.0000 mg | ORAL_TABLET | Freq: Two times a day (BID) | ORAL | Status: DC
Start: 2024-01-02 — End: 2024-01-10
  Administered 2024-01-02 – 2024-01-10 (×16): 20 mg via ORAL
  Filled 2024-01-02 (×17): qty 2

## 2024-01-02 MED ORDER — GABAPENTIN 400 MG OR CAPS
400.0000 mg | ORAL_CAPSULE | Freq: Three times a day (TID) | ORAL | Status: DC
Start: 2024-01-02 — End: 2024-01-10
  Administered 2024-01-02 – 2024-01-10 (×25): 400 mg via ORAL
  Filled 2024-01-02 (×25): qty 1

## 2024-01-02 MED ORDER — DULOXETINE HCL 20 MG OR CPEP
20.0000 mg | ORAL_CAPSULE | Freq: Two times a day (BID) | ORAL | Status: DC
Start: 2024-01-02 — End: 2024-01-10
  Administered 2024-01-02 – 2024-01-10 (×15): 20 mg via ORAL
  Filled 2024-01-02 (×18): qty 1

## 2024-01-02 MED ORDER — POLYVINYL ALCOHOL-POVIDONE PF 1.4-0.6 % OP SOLN
1.0000 [drp] | OPHTHALMIC | Status: DC | PRN
Start: 2024-01-02 — End: 2024-01-10

## 2024-01-02 MED ORDER — HYDROMORPHONE HCL 1 MG/ML IJ SOLN
1.0000 mg | Freq: Once | INTRAMUSCULAR | Status: AC
Start: 2024-01-02 — End: 2024-01-02
  Administered 2024-01-02: 14:00:00 1 mg via INTRAVENOUS
  Filled 2024-01-02: qty 1

## 2024-01-02 MED ORDER — SODIUM CHLORIDE 0.9 % IJ SOLN (CUSTOM)
3.0000 mL | Freq: Three times a day (TID) | INTRAMUSCULAR | Status: DC
Start: 2024-01-02 — End: 2024-01-10
  Administered 2024-01-02 – 2024-01-10 (×19): 3 mL via INTRAVENOUS

## 2024-01-02 MED ORDER — SODIUM CHLORIDE 0.9 % IJ SOLN (CUSTOM)
3.0000 mL | INTRAMUSCULAR | Status: DC | PRN
Start: 2024-01-02 — End: 2024-01-10

## 2024-01-02 MED ORDER — HYDROMORPHONE HCL 1 MG/ML IJ SOLN
1.0000 mg | Freq: Once | INTRAMUSCULAR | Status: AC | PRN
Start: 2024-01-02 — End: 2024-01-02
  Administered 2024-01-02: 21:00:00 1 mg via INTRAVENOUS

## 2024-01-02 MED ORDER — FLUTICASONE PROPIONATE 50 MCG/ACT NA SUSP
2.0000 | Freq: Every day | NASAL | Status: DC
Start: 2024-01-02 — End: 2024-01-10
  Administered 2024-01-02 – 2024-01-10 (×9): 2 via NASAL
  Filled 2024-01-02: qty 16

## 2024-01-02 MED ORDER — NALOXONE HCL 0.4 MG/ML IJ SOLN
0.1000 mg | INTRAMUSCULAR | Status: DC | PRN
Start: 2024-01-02 — End: 2024-01-10

## 2024-01-02 MED ORDER — HYDROMORPHONE HCL 1 MG/ML IJ SOLN
1.0000 mg | INTRAMUSCULAR | Status: AC | PRN
Start: 2024-01-02 — End: 2024-01-03
  Filled 2024-01-02 (×2): qty 1

## 2024-01-02 MED ORDER — TIZANIDINE HCL 4 MG OR TABS
2.0000 mg | ORAL_TABLET | Freq: Three times a day (TID) | ORAL | Status: DC | PRN
Start: 2024-01-02 — End: 2024-01-10
  Administered 2024-01-05: 2 mg via ORAL
  Filled 2024-01-02: qty 1

## 2024-01-02 MED ORDER — SODIUM CHLORIDE 0.9% TKO INFUSION
INTRAVENOUS | Status: DC | PRN
Start: 2024-01-02 — End: 2024-01-10

## 2024-01-02 MED ORDER — HYDROMORPHONE HCL 4 MG OR TABS
4.0000 mg | ORAL_TABLET | ORAL | Status: DC | PRN
Start: 2024-01-02 — End: 2024-01-10
  Administered 2024-01-02 – 2024-01-10 (×29): 4 mg via ORAL
  Filled 2024-01-02 (×3): qty 2
  Filled 2024-01-02 (×2): qty 1
  Filled 2024-01-02: qty 2
  Filled 2024-01-02: qty 1
  Filled 2024-01-02 (×4): qty 2
  Filled 2024-01-02 (×3): qty 1
  Filled 2024-01-02: qty 2
  Filled 2024-01-02: qty 1
  Filled 2024-01-02: qty 2
  Filled 2024-01-02: qty 1
  Filled 2024-01-02: qty 2
  Filled 2024-01-02 (×3): qty 1
  Filled 2024-01-02: qty 2
  Filled 2024-01-02: qty 1
  Filled 2024-01-02 (×3): qty 2
  Filled 2024-01-02 (×2): qty 1

## 2024-01-02 MED ORDER — SENNA 8.6 MG OR TABS
17.2000 mg | ORAL_TABLET | Freq: Every day | ORAL | Status: DC
Start: 2024-01-02 — End: 2024-01-08
  Administered 2024-01-02 – 2024-01-08 (×6): 17.2 mg via ORAL
  Filled 2024-01-02 (×7): qty 2

## 2024-01-02 MED ORDER — ALLOPURINOL 100 MG OR TABS
100.0000 mg | ORAL_TABLET | Freq: Every day | ORAL | Status: DC
Start: 2024-01-02 — End: 2024-01-10
  Administered 2024-01-02 – 2024-01-10 (×9): 100 mg via ORAL
  Filled 2024-01-02 (×9): qty 1

## 2024-01-02 MED ORDER — PROCHLORPERAZINE MALEATE 10 MG OR TABS
10.0000 mg | ORAL_TABLET | Freq: Four times a day (QID) | ORAL | Status: DC | PRN
Start: 2024-01-02 — End: 2024-01-10

## 2024-01-02 MED ORDER — HEPARIN SODIUM (PORCINE) 5000 UNIT/ML IJ SOLN
5000.0000 [IU] | Freq: Three times a day (TID) | INTRAMUSCULAR | Status: DC
Start: 2024-01-02 — End: 2024-01-10
  Administered 2024-01-02 – 2024-01-10 (×24): 5000 [IU] via SUBCUTANEOUS
  Filled 2024-01-02 (×24): qty 1

## 2024-01-02 NOTE — ED Notes (Signed)
Bed: MEDED10  Expected date:   Expected time:   Means of arrival:   Comments:  U

## 2024-01-02 NOTE — ED Notes (Signed)
 Bed: U  Expected date:   Expected time:   Means of arrival:   Comments:  Medic

## 2024-01-02 NOTE — Telephone Encounter (Signed)
Pt presented to [facility] ED.

## 2024-01-02 NOTE — ED Notes (Signed)
MD Edythe Lynn to bedside

## 2024-01-02 NOTE — ED Provider Notes (Signed)
 ED Provider Note  [place] electronic medical record reviewed for pertinent medical history.     Stephen [name] DOB: [birth year] PMD: Clinic, [facility]     Chief Complaint   Patient presents with    Leg Pain     Leg pain x3 days. Hx of kidney CA. Pain different from chemo pains. Home meds not helping.       HPI: Stephen [name] is a 73 year old male who has a past medical history of Chronic back pain, Congenital hydronephrosis, Gout, Headache, Hematuria, HTN (hypertension) (12/10/2021), Kidney disease, Kidney stones, Major depressive disorder, single episode, Polyarthropathy or polyarthritis of multiple sites, Retinal detachment, and Urethral stricture.     The patient presents with a chief complaint of leg pain that began on Sunday night, characterized by a shooting sensation from his buttocks down to his left leg. The pain has significantly impacted his mobility, rendering him unable to get out of bed the previous day. He describes the pain as most severe in the back of his thigh and also reports discomfort in the area near his kidneys. The patient denies any history of falls or trauma that could have precipitated this pain. For pain management, he took hydromorphone  earlier today.  The patient denies experiencing fevers in conjunction with his current pain and states this type of pain is a new experience for him.    Social History:  The patient lives with others, though the specifics are not detailed.  He relies on mobility aids, including a walker, cane, and wheelchair, as needed.  Dental: The patient wears dentures.            External Data Sources (Select all that apply):      Pertinent Medical History:    PMHx: As above    Past Surgical History:   Procedure Laterality Date    CT INSERTION OF SUPRAPUBIC CATH  09/25/2015    NEPHRECTOMY Right 1955    APPENDECTOMY      COLONOSCOPY      CYSTOSCOPY      CYSTOSCOPY W/ LASER LITHOTRIPSY      OTHER SURGICAL HISTORY      Interstim 01/29/2011    SPINE  SURGERY  09/21    Lumbar-sacral fusion    TRANSURETHRAL RESECTION OF PROSTATE         Family History   Adopted: Yes   Family history unknown: Yes       Current Outpatient Medications   Medication Instructions    allopurinol  (ZYLOPRIM ) 100 mg, Oral, DAILY    aluminum -magnesium -simethicone  (MAG-AL PLUS) 200-200-20 MG/5ML suspension Mix viscous lidocaine  2%, Mylanta, and Benadryl  liquid into a 1:1:1 solution.  Take 10mL by mouth every 4 hours as needed for pain.  Swish and spit or swallow for pain. If swallowed, wait 10 minutes prior to eating/drinking. May also apply medication to a Q-tip and dab on areas of sores.    Artificial Tear Solution (SOOTHE XP OP)     Balversa  6 mg, Oral, DAILY    busPIRone  (BUSPAR ) 20 mg, Oral, 2 TIMES DAILY    diphenhydrAMINE  (BENADRYL ) 12.5 MG/5ML liquid Mix viscous lidocaine  2%, Mylanta, and Benadryl  liquid into a 1:1:1 solution.  Take 10mL by mouth every 4 hours as needed for pain.  Swish and spit or swallow for pain. If swallowed, wait 10 minutes prior to eating/drinking. May also apply medication to a Q-tip and dab on areas of sores.    DULoxetine  (CYMBALTA ) 20 mg, Oral, 2 TIMES DAILY  fluticasone  propionate (FLONASE ) 50 MCG/ACT nasal spray 2 sprays, Each Naris, DAILY    gabapentin  (NEURONTIN ) 300 mg, Oral, 3 TIMES DAILY    HYDROmorphone  (DILAUDID ) 2 MG tablet Take 1 to 2 tablets (2mg  to 4mg ) by mouth every 4 hours as needed for moderate to severe pain    ketotifen  (ALAWAY ) 0.025 % ophthalmic solution 1 drop, Both Eyes, 2 TIMES DAILY    lidocaine  (LIDODERM ) 5 % patch 1 patch, Transdermal, EVERY 24 HOURS, Leave patch on for 12 hours, then remove for 12 hours.    lidocaine  (XYLOCAINE ) 2 % solution Mix viscous lidocaine  2%, Mylanta, and Benadryl  liquid into a 1:1:1 solution.  Take 10mL by mouth every 4 hours as needed for pain.  Swish and spit or swallow for pain. If swallowed, wait 10 minutes prior to eating/drinking. May also apply medication to a Q-tip and dab on areas of sores.     Multiple Vitamins-Minerals (MULTIVITAMIN WITH MINERALS) TABS tablet 1 tablet, Oral, DAILY    naloxone  (NARCAN ) 4 mg/0.1 mL nasal spray For suspected opioid overdose, call 911! Then spray once in one nostril. Repeat after 3 minutes if no or minimal response using a new spray in other nostril.    Nutritional Supplements (NEPRO) LIQD 3 Cans, Oral, DAILY    nystatin  (MYCOSTATIN ) 500,000 Units, Swish & Swallow, 4 TIMES DAILY    polyethylene glycol (GLYCOLAX ) 17 GM/SCOOP powder Take 17 grams (1 capful) by mouth daily. Mix with 4 to 8 ounces of fluid (water , juice, soda, coffee, or tea) prior to administration.    prochlorperazine  (COMPAZINE ) 10 mg, Oral, EVERY 6 HOURS PRN    senna (SENOKOT) 17.2 mg, Oral, AT BEDTIME    tizanidine  (ZANAFLEX ) 2 mg, Oral, EVERY 8 HOURS PRN    traZODone  (DESYREL ) 50 MG tablet Take one tab nightly by mouth at bedtime as needed for insomnia       Physical Exam  BP (!) 114/59 (BP Location: Left arm, BP Patient Position: Semi-Fowlers)   Pulse 60   Temp 97.2 F (36.2 C)   Resp 16   Ht 5' 6 (1.676 m)   Wt 67 kg (147 lb 11.3 oz)   SpO2 100%   BMI 23.84 kg/m   Physical Exam  Constitutional:       General: He is not in acute distress.     Appearance: Normal appearance. He is well-developed. He is not ill-appearing, toxic-appearing or diaphoretic.   HENT:      Head: Normocephalic and atraumatic.   Eyes:      Extraocular Movements: Extraocular movements intact.      Comments: Moves eyes spontaneously    Neck:      Comments: Able to touch chin to sternum without pain  Cardiovascular:      Rate and Rhythm: Normal rate and regular rhythm.      Heart sounds: No murmur heard.     No gallop.   Pulmonary:      Effort: Pulmonary effort is normal. No respiratory distress.      Breath sounds: Normal breath sounds. No wheezing, rhonchi or rales.   Chest:      Chest wall: No tenderness.   Abdominal:      General: Abdomen is flat.      Palpations: Abdomen is soft. There is no mass.      Tenderness: There  is no abdominal tenderness. There is no right CVA tenderness, left CVA tenderness, guarding or rebound.      Comments: Appears flat   Musculoskeletal:  General: Tenderness present. No swelling. Normal range of motion.      Cervical back: Normal range of motion and neck supple. No rigidity.      Right lower leg: No edema.      Left lower leg: No edema.      Comments: Moves all extremities. Tenderness and pain to entire left lower ext. 2+ dp pulse. No skin changes. Tender to palpation    Skin:     General: Skin is warm.   Neurological:      Mental Status: He is alert and oriented to person, place, and time.   Psychiatric:         Mood and Affect: Mood normal.         Orders/Medications    Orders Placed This Encounter   Procedures    X-Ray Foot Complete Minimum 3 Views - Left    X-Ray Tibia & Fibula 2 Views - Left    X-Ray Femur Minimum 2 Views - Left    X-Ray Hip Unilateral With Pelvis 2 or 3 Views - Left    MRI Spine Survey W/WO IV Contrast    CMP    CBC w/ Diff Lavender    Sedimentation Rate (ESR), Blood Lavender    C-Reactive Protein, Blood Green Plasma Separator Tube    Phosphorus, Blood Green Plasma Separator Tube    Magnesium , Blood Green Plasma Separator Tube    Basic Metabolic Panel, Blood Green Plasma Separator Tube    Liver Panel, Blood Green Plasma Separator Tube    CBC w/ Diff Lavender    Lab Add On Test    Bilirubin, Direct, Blood    CBC w/ Diff Lavender    Comprehensive Metabolic Panel    CBC w/ Diff    CBC w/ Diff    Consult/Referral to Primary Care    IP Consult to Angio/Interventional Radiology - Urinary Drainage    IP Consult to [facility] Nurse    ECG 12 Lead       Medications   nalOXone  (NARCAN ) injection 0.1 mg (has no administration in time range)   sodium chloride  0.9 % flush 3 mL (3 mL IntraVENOUS Given 01/08/24 1408)   sodium chloride  0.9 % flush 3 mL (has no administration in time range)   sodium chloride  0.9 % TKO infusion (has no administration in time range)   heparin  injection  5,000 Units (5,000 Units Subcutaneous Given 01/08/24 1407)   allopurinol  (ZYLOPRIM ) tablet 100 mg (100 mg Oral Given 01/08/24 0817)   artificial tears (REFRESH) ophthalmic solution 1 drop (has no administration in time range)   busPIRone  (BUSPAR ) tablet 20 mg (20 mg Oral Given 01/08/24 0817)   DULoxetine  (CYMBALTA ) CR capsule 20 mg (20 mg Oral Given 01/08/24 0817)   fluticasone  propionate (FLONASE ) nasal spray 50 mcg/spray (2 sprays Each Naris Given 01/08/24 0817)   gabapentin  (NEURONTIN ) capsule 400 mg (400 mg Oral Given 01/08/24 1408)   multivitamin with minerals tablet 1 tablet (1 tablet Oral Given 01/08/24 0817)   prochlorperazine  (COMPAZINE ) tablet 10 mg (has no administration in time range)   tiZANidine  (ZANAFLEX ) tablet 2 mg (2 mg Oral Given 01/05/24 1221)   traZODone  (DESYREL ) tablet 50 mg (50 mg Oral Given 01/07/24 2211)   HYDROmorphone  (DILAUDID ) tablet 4 mg (4 mg Oral Given 01/07/24 2212)   HYDROmorphone  (DILAUDID ) injection 1 mg (1 mg IntraVENOUS Given 01/03/24 1259)   HYDROmorphone  (DILAUDID ) injection 1 mg (1 mg IntraVENOUS Given 01/04/24 2321)   HYDROmorphone  (DILAUDID ) injection 1 mg (1 mg  IntraVENOUS Given 01/06/24 0637)   HYDROmorphone  (DILAUDID ) injection 1 mg (has no administration in time range)   maalox-lidocaine  viscous-diphenhydrAMINE  1:1:1 ratio (mucositis mixture #3) oral solution 10 mL (10 mL Mouth/Throat Given 01/08/24 1700)   acetaminophen  (TYLENOL ) tablet 975 mg (975 mg Oral Given 01/08/24 1408)   polyethylene glycol (MIRALAX ) packet 17 g (has no administration in time range)   senna (SENOKOT) tablet 17.2 mg (has no administration in time range)   HYDROmorphone  (DILAUDID ) injection 1 mg (1 mg IntraVENOUS Given 01/02/24 1334)   HYDROmorphone  (DILAUDID ) injection 1 mg (1 mg IntraVENOUS Given 01/02/24 2032)   gadobutrol  (GADAVIST ) injection SOLN 7 mL (7 mL IntraVENOUS Given 01/05/24 0144)           Medical Decision Making/Assessment/Plan    This is a(n) 73 year old male who has a past medical history of  Chronic back pain, Congenital hydronephrosis, Gout, Headache, Hematuria, HTN (hypertension) (12/10/2021), Kidney disease, Kidney stones, Major depressive disorder, single episode, Polyarthropathy or polyarthritis of multiple sites, Retinal detachment, and Urethral stricture. and presents with acute left lower ext pain likely cancer related. Doubt dissection or other acute process. Pt was admitted to medicine.       ED Course/Updates/Disposition  ED Clinical Impression as of 01/08/24 2028   Cancer associated pain            CMS-HCC/Risk Adjustment Factor Diagnoses       Needs Recertification        Category and Diagnosis From    CMS-HCC 21:  Unspecified protein-calorie malnutrition (CMS-HCC) Antonucci, Garnette Barter, MD on 03/23/2023    CMS-HCC 47:  Immunodeficiency, unspecified (CMS-HCC) Belen Number, MD on 01/10/2022    CMS-HCC 48:  Other primary thrombophilia (CMS-HCC) Alvia Marolyn Shams, MD on 11/28/2023    CMS-HCC 55:  Opioid overdose (CMS-HCC) [facility] on 01/29/2023    CMS-HCC 59:  Major depressive disorder, recurrent, unspecified (CMS-HCC) [facility] [employer]    CMS-HCC 72:  Cervical myelopathy (CMS-HCC) Zlomislic, Vinko, MD on 09/29/2023    CMS-HCC 84:  Acute respiratory failure with hypoxia (CMS-HCC) Alvia Marolyn Shams, MD on 11/28/2023    CMS-HCC 108:  Thoracic aortic ectasia (CMS-HCC) Luke Christella Handy, MD on 04/27/2023                               Data Reviewed:      Limited Bedside Ultrasound Procedure:   All images archived on [facility] servers.        Risk of Complications and/or Morbidity:                 Ninetta Harlene Bergamo, MD  01/08/24 2049

## 2024-01-02 NOTE — H&P (Signed)
 Hospital Medicine Admission    Attending MD:   Jodelle Doyal PARAS, MD*    Chief Complaint:  Pain from L foot up to the hip and shoulder    History of Present Illness:     Stephen [name] is a 73 year old male with a history of congenital R hydronephrosis s/p R nephrectomy, OA, HTN, MDD, gout, metastatic urothelial carcinoma (bones, lymph nodes) s/p L nephrostomy tube currently on PO erdafitinib  c/b diffuse cancer-related pain, recently diagnosed UTI (12/31) s/p nephrostomy tube exchange and cefpodoxime, and recently diagnosed LUE cellulitis (12/27/23) s/p cephalexin who presented w/ diffuse L sided pain.    Challenging historian as he is tangential and doesn't answer questions directly, however he reports lots of pain related to his cancer at baseline and is followed by Palliative Care but reports he was doing ok until Sunday. Over the last 2-3 days he developed worsening pain in his L side from his L foot/heel up to his hip and even his L shoulder and couldn't get out of bed today. No recent falls. His home medications were not effective. The pain is described as stabbing and is an 8/10 currently, which is improved after receiving IV dilaudid . Is supposed to get radiation to his back in the next few weeks. He also notes some harder stools this week. He has chronic back pain which radiates to his stomach and may be a little worse than prior.    Declined for me to contact his sister at this time stating, she is busy with her computer work, don't bother her.    Past Medical History:  Past Medical History:   Diagnosis Date    Chronic back pain     Congenital hydronephrosis     Gout     Headache     Hematuria     HTN (hypertension) 12/10/2021    Kidney disease     Kidney stones     Major depressive disorder, single episode     Polyarthropathy or polyarthritis of multiple sites     Retinal detachment     Urethral stricture        Past Surgical History:  Past Surgical History:   Procedure Laterality Date    CT  INSERTION OF SUPRAPUBIC CATH  09/25/2015    NEPHRECTOMY Right 1955    APPENDECTOMY      COLONOSCOPY      CYSTOSCOPY      CYSTOSCOPY W/ LASER LITHOTRIPSY      OTHER SURGICAL HISTORY      Interstim 01/29/2011    SPINE SURGERY  09/21    Lumbar-sacral fusion    TRANSURETHRAL RESECTION OF PROSTATE         Allergies:  Allergies   Allergen Reactions    Sulfa Drugs Unspecified, Hives and Other       Medications:  Prior to Admission Medications   Prescriptions Last Dose Informant Patient Reported? Taking?   Artificial Tear Solution (SOOTHE XP OP)   Yes No   DULoxetine  (CYMBALTA ) 20 MG CR capsule   No No   Sig: Take 1 capsule (20 mg) by mouth 2 times daily.   Erdafitinib  3 MG TABS   No No   Sig: Take 6 mg by mouth daily.   HYDROmorphone  (DILAUDID ) 2 MG tablet   No No   Sig: Take 1 to 2 tablets (2mg  to 4mg ) by mouth every 4 hours as needed for moderate to severe pain   Multiple Vitamins-Minerals (MULTIVITAMIN WITH MINERALS) TABS tablet  No No   Sig: Take 1 tablet by mouth daily.   Nutritional Supplements (NEPRO) LIQD   No No   Sig: Take 3 Cans by mouth daily.   allopurinol  (ZYLOPRIM ) 100 MG tablet   No No   Sig: Take 1 tablet (100 mg) by mouth daily.   aluminum -magnesium -simethicone  (MAG-AL PLUS) 200-200-20 MG/5ML suspension   No No   Sig: Mix viscous lidocaine  2%, Mylanta, and Benadryl  liquid into a 1:1:1 solution.  Take 10mL by mouth every 4 hours as needed for pain.  Swish and spit or swallow for pain. If swallowed, wait 10 minutes prior to eating/drinking. May also apply medication to a Q-tip and dab on areas of sores.   busPIRone  (BUSPAR ) 10 MG tablet   No No   Sig: Take 2 tablets (20 mg) by mouth 2 times daily.   diphenhydrAMINE  (BENADRYL ) 12.5 MG/5ML liquid   No No   Sig: Mix viscous lidocaine  2%, Mylanta, and Benadryl  liquid into a 1:1:1 solution.  Take 10mL by mouth every 4 hours as needed for pain.  Swish and spit or swallow for pain. If swallowed, wait 10 minutes prior to eating/drinking. May also apply medication  to a Q-tip and dab on areas of sores.   fluticasone  propionate (FLONASE ) 50 MCG/ACT nasal spray   No No   Sig: Spray 2 sprays into each nostril daily.   gabapentin  (NEURONTIN ) 300 MG capsule   No No   Sig: Take 1 capsule (300 mg) by mouth 3 times daily.   ketotifen  (ALAWAY ) 0.025 % ophthalmic solution   Yes No   Sig: Place 1 drop into both eyes 2 times daily.   lidocaine  (LIDODERM ) 5 % patch   No No   Sig: Apply 1 patch topically every 24 hours. Leave patch on for 12 hours, then remove for 12 hours.   lidocaine  (XYLOCAINE ) 2 % solution   No No   Sig: Mix viscous lidocaine  2%, Mylanta, and Benadryl  liquid into a 1:1:1 solution.  Take 10mL by mouth every 4 hours as needed for pain.  Swish and spit or swallow for pain. If swallowed, wait 10 minutes prior to eating/drinking. May also apply medication to a Q-tip and dab on areas of sores.   naloxone  (NARCAN ) 4 mg/0.1 mL nasal spray   No No   Sig: For suspected opioid overdose, call 911! Then spray once in one nostril. Repeat after 3 minutes if no or minimal response using a new spray in other nostril.   nystatin  (MYCOSTATIN ) 100,000 units/mL suspension   No No   Sig: 5 mL (500,000 Units) by Swish & Swallow route 4 times daily.   polyethylene glycol (GLYCOLAX ) 17 GM/SCOOP powder   No No   Sig: Take 17 grams (1 capful) by mouth daily. Mix with 4 to 8 ounces of fluid (water , juice, soda, coffee, or tea) prior to administration.   prochlorperazine  (COMPAZINE ) 10 MG tablet   No No   Sig: Take 1 tablet (10 mg) by mouth every 6 hours as needed (Nausea/Vomiting).   senna (SENOKOT) 8.6 MG tablet   No No   Sig: Take 2 tablets (17.2 mg) by mouth at bedtime.   tizanidine  (ZANAFLEX ) 2 MG tablet   No No   Sig: Take 1 tablet (2 mg) by mouth every 8 hours as needed (muscle spasm).   traZODone  (DESYREL ) 50 MG tablet   No No   Sig: Take one tab nightly by mouth at bedtime as needed for insomnia  Facility-Administered Medications: None        Social History:  Socioeconomic History     Marital status: Single   Tobacco Use    Smoking status: Never     Passive exposure: Never    Smokeless tobacco: Never   Vaping Use    Vaping status: Never Used   Substance and Sexual Activity    Alcohol  use: No    Drug use: Not Currently    Sexual activity: Not Currently     Partners: Female   Social Activities of Daily Living Present    Military Service No    Blood Transfusions Yes    Caffeine Concern No    Occupational Exposure No    Hobby Hazards No    Sleep Concern Yes     Comment: due to my lower back discomfort and leg discomfort    Stress Concern Yes     Comment: Health/ housing/ financial    Weight Concern No    Special Diet No    Back Care Yes     Comment: Am very careful with any activity    Exercises Regularly Yes    Seat Belt Use Yes    Performs Self-Exams Yes    Bike Helmet Use No       Family History:  Family History   Adopted: Yes   Family history unknown: Yes       ROS 10 point:  Negative unless otherwise stated above.    Physical Exam:  BP 112/65 (BP Location: Left arm, BP Patient Position: Semi-Fowlers)   Pulse 68   Temp 96.2 F (35.7 C)   Resp 16   Ht 5' 6 (1.676 m)   Wt 73.5 kg (162 lb)   SpO2 97%   BMI 26.15 kg/m   Physical Exam  Nursing note reviewed: Diffuse severe muscle wasting.   Constitutional:       General: He is not in acute distress.     Appearance: Normal appearance. He is normal weight. He is not ill-appearing, toxic-appearing or diaphoretic.   HENT:      Head: Normocephalic and atraumatic.      Mouth/Throat:      Mouth: Mucous membranes are moist.   Eyes:      General: No scleral icterus.     Extraocular Movements: Extraocular movements intact.      Conjunctiva/sclera: Conjunctivae normal.   Cardiovascular:      Rate and Rhythm: Normal rate and regular rhythm.      Pulses: Normal pulses.      Heart sounds: No murmur heard.  Pulmonary:      Effort: Pulmonary effort is normal. No respiratory distress.      Breath sounds: Normal breath sounds. No stridor. No wheezing, rhonchi  or rales.   Abdominal:      General: Bowel sounds are normal. There is no distension.      Palpations: Abdomen is soft.      Tenderness: There is no abdominal tenderness. There is no guarding or rebound.   Genitourinary:     Comments: L nephrostomy in place draining clear dark yellow urine  Musculoskeletal:      Cervical back: Normal range of motion. No rigidity.      Right lower leg: No edema.      Left lower leg: No edema.   Skin:     General: Skin is warm and dry.      Comments: Dry, cracked hands w/ peeling skin on fingers but no erythema,  edema, or open wounds   Neurological:      General: No focal deficit present.      Mental Status: He is oriented to person, place, and time.      Comments: 3/5 strength w/ L hip flexion and plantar/dorsiflexion, 4/5 strength w/ R hip flexion, 5/5 in BUE,    Psychiatric:         Mood and Affect: Mood normal.         Behavior: Behavior normal.         Thought Content: Thought content normal.          Labs and Other Data:  Lab Results   Component Value Date    NA 136 01/02/2024    K 4.1 01/02/2024    CL 93 (L) 01/02/2024    BICARB 28 01/02/2024    BUN 22 01/02/2024    CREAT 1.11 01/02/2024    GLU 109 (H) 01/02/2024    CA 10.2 01/02/2024     Lab Results   Component Value Date    WBC 7.9 01/02/2024    HGB 9.8 (L) 01/02/2024    HCT 28.8 (L) 01/02/2024    PLT 192 01/02/2024     Lab Results   Component Value Date    WBC 7.9 01/02/2024    HGB 9.8 (L) 01/02/2024    HCT 28.8 (L) 01/02/2024    PLT 192 01/02/2024    SEG 75.7 01/02/2024    LYMPHS 14.1 01/02/2024    MONOS 9.0 01/02/2024    EOS 0.5 01/02/2024     Lab Results   Component Value Date    AST 20 01/02/2024    ALT 13 01/02/2024    ALK 104 01/02/2024    TBILI 1.27 (H) 01/02/2024    TP 6.6 01/02/2024    ALB 3.4 (L) 01/02/2024     No results found for: INR, PTT  No results found for: ARTPH, ARTPO2, ARTPCO2  No results found for: PH, PO2, PCO2  No results found for: TSH, FREET4, T3  No results found for: CPK,  CKMBH, TROPONIN  No results found for: CPK, CKMBH, TROPONIN  No results found for: PHUA, SGUA, GLUCOSEUA, KETONEUA, BLOODUA, PROTEINUA, LEUKESTUA, NITRITEUA, WBCUA, RBCUA, EPITHCELLSUA, CRYSTALSUA, COMMENTSUA    Micro:   Microbiology Results (last 30 days)       ** No results found for the last 720 hours. **            Imaging:   X-Ray Hip Unilateral With Pelvis 2 or 3 Views - Left   Final Result   IMPRESSION:   No acute displaced fracture or dislocation.      Redemonstration of multifocal osteosclerotic metastases of the imaged portion of the lower lumbar spine, pelvis, and left proximal femur, better demonstrated on prior CT from 11/28/2023.      Redemonstration of lumbosacral hardware, incompletely evaluated.      X-Ray Femur Minimum 2 Views - Left   Final Result   IMPRESSION:   No acute displaced fracture or dislocation.      Redemonstration of multifocal osteosclerotic metastases of the left hemipelvis and proximal femur, better demonstrated on prior CT abdomen pelvis.      X-Ray Tibia & Fibula 2 Views - Left   Final Result   IMPRESSION:   No acute displaced fracture or dislocation.      No osteolytic or osteoblastic lesions. No radiographic evidence of osteonecrosis.      Vascular calcifications are present.  X-Ray Foot Complete Minimum 3 Views - Left   Final Result   IMPRESSION:   No acute displaced fracture or dislocation.      Bones are demineralized but no osteolytic or osteoblastic lesions.      Minimal plantar calcaneal enthesopathy. Moderate degenerative changes of the first metatarsophalangeal joint. 2nd through fifth hammertoe deformities on this nonweightbearing study.      Vascular calcifications are present.          Assessment and Care Plan:  Estanislao Harmon is a 73 year old male with a history of congenital R hydronephrosis s/p R nephrectomy, OA, HTN, MDD, gout, metastatic urothelial carcinoma (bones, lymph nodes) s/p L nephrostomy tube currently on PO  erdafitinib  c/b diffuse cancer-related pain, recently diagnosed UTI (12/31) s/p nephrostomy tube exchange and cefpodoxime, and recently diagnosed LUE cellulitis (12/27/23) s/p cephalexin who presented w/ diffuse L sided pain suspected 2/2 cancer.     #Diffuse left sided pain  #Cancer-related pain: Followed by Palliative Care for cancer-related pain, last seen 1/6 w/ medications adjusted slightly (stopped pregabalin  and changed gabapentin  to 300 mg TID from BID). Reported taking 4 mg dilaudid  once to twice daily at this time for pain in his L shoulder, low back, and neck. Now reporting worsening pain in his L side from the foot up to the shoulder. On my exam, he has 3/5 strength w/ L hip flexion and 4/5 strength w/ L plantar/dorsiflexion. 4+/5 on the R. Difficult to say whether this is related to pain or is true weakness. Does have known mets to spine. Last MRI spine was 12/02/23 and showed increased diffuse osseous mets within the spine, ribs, and sacrum without new pathologic fractures as well as possibly increased epidural extension within the midthoracic spine. Reports he was previously able to ambulate but couldn't get out of bed. Is scheduled to receive radiation to his back though I'm not sure when exactly this is planned.   - Consulted Palliative Care to see 1/15, appreciate recommendations  - Consult radiation Oncology in AM  - For now, pain control:   - Continue home duloxetine  20 mg BID   - Increased gabapentin  to 400 mg TID   - Continue home tizanidine  2 mg Q8H PRN   - Dilaudid  4 mg PO Q4H PRN for moderate to severe pain   - Dilaudid  1 mg IV Q4H PRN for rescue   - Previously declined tylenol  and lidocaine  patches  - MRI spine survey ordered    #Metastatic urothelial carcinoma: Followed by Dr. Jackquline. Known mets to bones, diffuse. Had XRT to shoulder previously and is planned for XRT to his back soon. On erdafitinib . Some talk of hospice per Palliative Care notes but patient not wanting this at this time  and prefers to continue treatment.  - Will email Dr. Jackquline to notify him of admission  - Hold home erdafitinib  6 mg daily for now    #S/p L nephrostomy tube placement c/b  #?Leaking  #Recurrent UTI's: Recently treated for UTI 12/31 at OSH w/ cefpodoxime but ultimately culture grew mixed flora. No e/o UTI at this time. Most recently had nephrostomy tubes replaced 12/31. Possibly some leaking from his nephrostomy site and has IR appointment on 1/16.   - IR consult in AM if ongoing leaking and need for tube check/exchange    #Constipation: Continue home senna and miralax , increase if needed    #Elevated Tbili: Tbili 1.27 increased from 8 days ago when it was normal. No significant abdominal pain or  N/V.   - Dbili added on  - Trend    #Chronic normocytic anemia: Hgb 9.8 stable/increased from prior without e/o acute blood loss. Suspect anemia of chronic disease 2/2 malignancy.  - Trend    #Gout: Continue home allopurinol  100 mg daily    #MDD: Continue home buspirone  20 mg BID    #Insomnia: Continue home trazodone  50 mg nightly    #Diet: Regular diet  #VTE prophylaxis: Heparin   #Proxy decision maker: Darin [place], [phone]  #Code status: Full code     Disposition Planning 01/02/2024:  Days to Complete Medical Plan of Care Requiring Ongoing Hospitalization: 4 days out                 (Discharge Planning and EDD Info)  Other remaining discharge needs: None   Discharge location: Home  Outpatient Follow-up Needed: PCP, Oncology, Radiation Oncology, Palliative Care        Larraine Mainland, PA-C  Division of Hospital Medicine  01/02/24, 3:00 PM

## 2024-01-02 NOTE — EMS Narrative (Addendum)
 EMS note for [id number] by [email or web address], last updated at 10:22 on 2024-01-02  PETER [facility]     1896-12-19     72 Years       49.9 kg                      Patient's symptom(s):  M79.6 - Pain in right arm  M79.6 - Pain in right arm                      Paramedic's impression(s):  M79.60 - Pain in right arm  M79.60 - Pain in right arm                      [id number] responded to a private residence for a 73 YO M with a chief complain   of leg pain. Patient was found sitting upright tracking. Patient states for   the last two days patient has been feeling 9/10 sharp pain on his left leg   which radiated to his lower back. which eventually led to the activation   of 911. Patient has a history of liver cancer. Patient is Alert and   oriented x4 with a gcs of 15. Negative on the stroke scale. Denies of any   recent trauma. PMS intact ox 4 extremities. No outward signs of trauma.   Denies of any recent falls or injuries. No dizziness nausea vomiting. Has a   catheter with no abnormalities or complications. Confirms pain on   palpation. Skin signs pink warm and dry. No dyspnea, no LOC, no diarrhea or   recent illness symptoms.  Vitals and assessment,  Patient laid in position   of comfort. No other initiated or indicated treatments noted. Patient   transported code 19 to  [facility] with no notable changes during   transport. Verbal report given to nurse.                       Patient's Medical History:  C64.9 - Malignant neoplasm of unspecified kidney, except renal pelvis                      Patient's Known Allergies:  75086 -   [id number] - No known allergies                      Patient's Medications:  Hydromorphone                       Paramedic Physical Exam:  Eye (Left) Assessment: PERRL  Eye (Right) Assessment: PERRL  Mental Status Assessment: Oriented-Time  Mental Status Assessment: Oriented-Person  Mental Status Assessment: Oriented-Event  Mental Status Assessment: Oriented-Place  Neurological  Assessment: Strength-Symmetric  Neurological Assessment: Speech Normal  Neurological Assessment: Strength-Normal  Neurological Assessment: Gait-Normal                      EMS Response Details:  EMS responded at 09:09  EMS arrived at 09:23

## 2024-01-02 NOTE — Telephone Encounter (Signed)
 Outgoing call to patient and [facility] to call clinic [phone].

## 2024-01-02 NOTE — Telephone Encounter (Addendum)
 Patient is calling complaining of symptoms. Patient is requesting medical advice.     Who is calling (name +ph#): Stephen [name] (patient) [phone]     Procedure: neph tube     What are the symptoms? Neph tube leaking, pain on R side, unable to walk    Pain level 0-10: 10, pt states he is getting ready to contact an ambulance to transport him to [facility] ED, due to pain    When did the symptoms start? Last night    Where is the pain located?R side, kidney area    Is there bleeding? no

## 2024-01-02 NOTE — ED Notes (Signed)
 R chest port accessed using sterile technique, labs drawn and occlusive dressing in place.

## 2024-01-03 DIAGNOSIS — R269 Unspecified abnormalities of gait and mobility: Secondary | ICD-10-CM | POA: Diagnosis present

## 2024-01-03 DIAGNOSIS — K5903 Drug induced constipation: Secondary | ICD-10-CM

## 2024-01-03 DIAGNOSIS — R339 Retention of urine, unspecified: Secondary | ICD-10-CM

## 2024-01-03 DIAGNOSIS — N139 Obstructive and reflux uropathy, unspecified: Secondary | ICD-10-CM | POA: Diagnosis present

## 2024-01-03 DIAGNOSIS — G893 Neoplasm related pain (acute) (chronic): Secondary | ICD-10-CM

## 2024-01-03 DIAGNOSIS — Z905 Acquired absence of kidney: Secondary | ICD-10-CM | POA: Diagnosis present

## 2024-01-03 DIAGNOSIS — T402X5A Adverse effect of other opioids, initial encounter: Secondary | ICD-10-CM | POA: Diagnosis present

## 2024-01-03 DIAGNOSIS — C7951 Secondary malignant neoplasm of bone: Secondary | ICD-10-CM

## 2024-01-03 DIAGNOSIS — M5416 Radiculopathy, lumbar region: Secondary | ICD-10-CM

## 2024-01-03 LAB — LIVER PANEL, BLOOD
ALT (SGPT): 10 U/L (ref 0–41)
AST (SGOT): 21 U/L (ref 0–40)
Albumin: 3 g/dL — ABNORMAL LOW (ref 3.5–5.2)
Alkaline Phos: 85 U/L (ref 40–129)
Bilirubin, Dir: 0.3 mg/dL — ABNORMAL HIGH (ref <=0.2)
Bilirubin, Tot: 1.01 mg/dL (ref <1.2)
Total Protein: 5.7 g/dL — ABNORMAL LOW (ref 6.0–8.0)

## 2024-01-03 LAB — CBC WITH DIFF, BLOOD
ANC-Automated: 3.9 10*3/uL (ref 1.6–7.0)
Abs Basophils: 0 10*3/uL (ref <0.2)
Abs Eosinophils: 0.1 10*3/uL (ref 0.0–0.5)
Abs Lymphs: 1.2 10*3/uL (ref 0.8–3.1)
Abs Monos: 0.6 10*3/uL (ref 0.2–0.8)
Basophils: 0.5 %
Eosinophils: 2.1 %
Hct: 26.1 % — ABNORMAL LOW (ref 40.0–50.0)
Hgb: 8.6 g/dL — ABNORMAL LOW (ref 13.7–17.5)
Imm Gran %: 0.3 % (ref <1)
Imm Gran Abs: 0 10*3/uL (ref <0.1)
Lymphocytes: 19.8 %
MCH: 31.2 pg (ref 26.0–32.0)
MCHC: 33 g/dL (ref 32.0–36.0)
MCV: 94.6 um3 (ref 79.0–95.0)
MPV: 10.3 fL (ref 9.4–12.4)
Monocytes: 10.2 %
Plt Count: 171 10*3/uL (ref 140–370)
RBC: 2.76 10*6/uL — ABNORMAL LOW (ref 4.60–6.10)
RDW: 14 % (ref 12.0–14.0)
Segs: 67.1 %
WBC: 5.8 10*3/uL (ref 4.0–10.0)

## 2024-01-03 LAB — BASIC METABOLIC PANEL, BLOOD
Anion Gap: 11 mmol/L (ref 7–15)
BUN: 22 mg/dL (ref 8–23)
Bicarbonate: 30 mmol/L — ABNORMAL HIGH (ref 22–29)
Calcium: 9.6 mg/dL (ref 8.5–10.6)
Chloride: 97 mmol/L — ABNORMAL LOW (ref 98–107)
Creatinine: 1.26 mg/dL — ABNORMAL HIGH (ref 0.67–1.17)
Glucose: 117 mg/dL — ABNORMAL HIGH (ref 70–99)
Potassium: 3.9 mmol/L (ref 3.5–5.1)
Sodium: 138 mmol/L (ref 136–145)
eGFR Based on CKD-EPI 2021 Equation: >60 mL/min/{1.73_m2}

## 2024-01-03 LAB — MAGNESIUM, BLOOD: Magnesium: 1.8 mg/dL (ref 1.6–2.4)

## 2024-01-03 LAB — PHOSPHORUS, BLOOD: Phosphorous: 6.5 mg/dL — ABNORMAL HIGH (ref 2.7–4.5)

## 2024-01-03 MED ADMIN — Hydromorphone HCl Inj 1 MG/ML: 1 mg | INTRAVENOUS | NDC 00409128303

## 2024-01-03 NOTE — Progress Notes (Signed)
 Medicine Progress Note    Current Hospital Stay:   1 day - Admitted on: 01/02/2024    ID: Stephen [name] is a 73 year old male with a history of congenital R hydronephrosis s/p R nephrectomy, OA, HTN, MDD, gout, metastatic urothelial carcinoma (bones, lymph nodes) s/p L nephrostomy tube currently on PO erdafitinib  c/b diffuse cancer-related pain, recently diagnosed UTI (12/31) s/p nephrostomy tube exchange and cefpodoxime, and recently diagnosed LUE cellulitis (12/27/23) s/p cephalexin who presented w/ diffuse L sided pain.    Subjective / Interval Events   Interval/Subjective:  -Pt states that since Sunday he has been having significant pain on his L side of his back radiating down to his legs and up to his shoulder  --Pt reports that at baseline he has a lot of pain, but noticed it get significantly worse over the past 2-3 days  --Notes some urinary leaking from his nephrostomy tube; denies stool incontinenece  --Home medications include pregabalin , duloxetine , hydromorphone , tizanidine , and lidocaine  patches - states he hasn't gotten relief from many of those.    Scheduled Medications   allopurinol   100 mg Oral Daily 100 mg at 01/03/24 0825    busPIRone   20 mg Oral BID 20 mg at 01/03/24 0825    DULoxetine   20 mg Oral BID 20 mg at 01/03/24 9173    fluticasone  propionate  2 spray Each Naris Daily 2 spray at 01/03/24 9170    gabapentin   400 mg Oral TID 400 mg at 01/03/24 1300    heparin   5,000 Units Subcutaneous Q8H 5,000 Units at 01/03/24 1300    multivitamin with minerals  1 tablet Oral Daily 1 tablet at 01/03/24 0825    nystatin   5 mL Swish & Swallow 4x Daily 500,000 Units at 01/03/24 1209    polyethylene glycol  17 g Oral Daily 17 g at 01/03/24 0825    senna  17.2 mg Oral Daily 17.2 mg at 01/03/24 0825    sodium chloride   3 mL IntraVENOUS Q8H 3 mL at 01/03/24 1300     Continuous Medications   sodium chloride          PRN Medications   artificial tears  1 drop Both Eyes PRN    HYDROmorphone   1 mg IntraVENOUS Q4H  PRN    HYDROmorphone   4 mg Oral Q4H PRN    nalOXone   0.1 mg IntraVENOUS Q2 Min PRN    prochlorperazine   10 mg Oral Q6H PRN    sodium chloride   3 mL IntraVENOUS PRN    sodium chloride    IntraVENOUS Continuous PRN    tizanidine   2 mg Oral Q8H PRN    traZODone   50 mg Oral Nightly PRN       Objective   Vitals   Latest Entry  Range (last 24 hours)    Temperature: 98 F (36.7 C)  Temp  Avg: 97.7 F (36.5 C)  Min: 96 F (35.6 C)  Max: 99.4 F (37.4 C)    Blood pressure (BP): 105/53  BP  Min: 103/50  Max: 134/63    Heart Rate: 53  Pulse  Avg: 63.4  Min: 52  Max: 76    Respirations: 17  Resp  Avg: 16.8  Min: 14  Max: 19    SpO2: 95 %  SpO2  Avg: 95.4 %  Min: 92 %  Max: 97 %       No data recorded     Intake/Output (Last day):  01/14 0600 - 01/15 0559  In: -  Out: 1275 [Urine:1275]  01/02/2024 0600 - 01/03/2024 0600  Urine x0  Stool x0  Emesis x0       Weights:  Weights (last 14 days)       Date/Time Weight Weight Source Percentage Weight Change (%) Who    01/02/24 1111 73.5 kg (162 lb) Bed scale 0 % KL            Physical Exam:  General: NAD, AOx3, well-developed, well-nourished  HEENT: PERRLA, EOMI, sclera anicteric, MMM, pharynx w/out erythema or exudate  Neck: Supple, trachea midline, no LAD, no thyromegaly  CV: RRR, S1/S2 normal, no r/m/g, no JVD, no carotid bruit, no pedal edema  Lungs: CTAB, no rales/rhonchi/wheezing, no increased wob, no accessory muscle use  Abdomen: Soft, NTND, no HSM, no masses, BS+, no guarding, no rebound  Extremities: DP/PT 2+, warm, well-perfused, no edema/cyanosis/clubbing  MSK: No joint erythema, tenderness, or effusions  Neuro: CN II-XII grossly intact, no focal deficits, UE/LE strength 5/5, normal gait  Skin: No obvious rashes or lesions, no jaundice  Psych: Normal mood and affect, no SI/HI    Basic Labs:   BMP:  CBC:   Recent Labs     01/02/24  1215 01/03/24  0440   NA 136 138   K 4.1 3.9   CL 93* 97*   BICARB 28 30*   BUN 22 22   CREAT 1.11 1.26*   GLU 109* 117*   CA 10.2 9.6   MG  --   1.8   PHOS  --  6.5*     Ca 9.6 (01/15) Mg 1.8 (01/15) Phos 6.5* (01/15)    Recent Labs     01/02/24  1215 01/03/24  0440   WBC 7.9 5.8   HGB 9.8* 8.6*   HCT 28.8* 26.1*   PLT 192 171   SEG 75.7 67.1   LYMPHS 14.1 19.8   MONOS 9.0 10.2        Coags:  LFTs:   No results for input(s): PTT, INR, PT in the last 72 hours.   Recent Labs     01/02/24  1215 01/03/24  0440   ALK 104 85   AST 20 21   ALT 13 10   TBILI 1.27* 1.01   DBILI 0.3* 0.3*   TP 6.6 5.7*   ALB 3.4* 3.0*        Microbiology  None    Imaging:   X-Ray Femur Minimum 2 Views - Left    Result Date: 01/02/2024  IMPRESSION: No acute displaced fracture or dislocation. Redemonstration of multifocal osteosclerotic metastases of the left hemipelvis and proximal femur, better demonstrated on prior CT abdomen pelvis.    X-Ray Hip Unilateral With Pelvis 2 or 3 Views - Left    Result Date: 01/02/2024  IMPRESSION: No acute displaced fracture or dislocation. Redemonstration of multifocal osteosclerotic metastases of the imaged portion of the lower lumbar spine, pelvis, and left proximal femur, better demonstrated on prior CT from 11/28/2023. Redemonstration of lumbosacral hardware, incompletely evaluated.    X-Ray Foot Complete Minimum 3 Views - Left    Result Date: 01/02/2024  IMPRESSION: No acute displaced fracture or dislocation. Bones are demineralized but no osteolytic or osteoblastic lesions. Minimal plantar calcaneal enthesopathy. Moderate degenerative changes of the first metatarsophalangeal joint. 2nd through fifth hammertoe deformities on this nonweightbearing study. Vascular calcifications are present.    X-Ray Tibia & Fibula 2 Views - Left    Result Date: 01/02/2024  IMPRESSION: No acute displaced fracture or  dislocation. No osteolytic or osteoblastic lesions. No radiographic evidence of osteonecrosis. Vascular calcifications are present.       EKG:  No components found for: QTCINTERVAL     Procedures: None.     Assessment / Plan   Assessment and Care  Plan:  Stephen [name] is a 73 year old male with a history of congenital R hydronephrosis s/p R nephrectomy, OA, HTN, MDD, gout, metastatic urothelial carcinoma (bones, lymph nodes) s/p L nephrostomy tube currently on PO erdafitinib  c/b diffuse cancer-related pain, recently diagnosed UTI (12/31) s/p nephrostomy tube exchange and cefpodoxime, and recently diagnosed LUE cellulitis (12/27/23) s/p cephalexin who presented w/ diffuse L sided pain suspected 2/2 cancer.      #Diffuse left sided pain  #Cancer-related pain:   Followed by Palliative Care for cancer-related pain, last seen 1/6 w/ medications adjusted slightly (stopped pregabalin  and changed gabapentin  to 300 mg TID from BID). Reported taking 4 mg dilaudid  once to twice daily at this time for pain in his L shoulder, low back, and neck. Now reporting worsening pain in his L side from the foot up to the shoulder. On exam, pt has 3/5 strength w/ L hip flexion and 4/5 strength w/ L plantar/dorsiflexion. 4+/5 on the R. Difficult to say whether this is related to pain or is true weakness. Does have known mets to spine. Last MRI spine was 12/02/23 and showed increased diffuse osseous mets within the spine, ribs, and sacrum without new pathologic fractures as well as possibly increased epidural extension within the midthoracic spine. Reports he was previously able to ambulate but couldn't get out of bed. Is scheduled to receive radiation to his back - was scheduled to get it 1/22 but after reaching out to rad-onc possible that he can get it as early as tomorrow.  - Appreciate rad-onc assistance; plan for radiation 1/16 tentatively  - Consulted Palliative Care to see 1/15, appreciate recommendations  - For now, pain control:              - Continue home duloxetine  20 mg BID              - Increased gabapentin  to 400 mg TID              - Continue home tizanidine  2 mg Q8H PRN              - Dilaudid  4 mg PO Q4H PRN for moderate to severe pain              - Dilaudid  1  mg IV Q4H PRN for rescue              - Previously declined tylenol  and lidocaine  patches  -MRI spine survey ordered  -PT ordered     #Metastatic urothelial carcinoma: Followed by Dr. Jackquline. Known mets to bones, diffuse. Had XRT to shoulder previously and is planned for XRT to his back soon - scheduled on 1/22 but after reaching out to rad onc today, can try to expedite it to sometime this week. On erdafitinib . Some talk of hospice per Palliative Care notes but patient not wanting this at this time and prefers to continue treatment.  -Likely radiation tomorrow per rad-onc   -Will reach out to Dr. Jackquline to notify him of admission  -Hold home erdafitinib  6 mg daily for now     #S/p L nephrostomy tube placement c/b  #?Leaking  #Recurrent UTI's: Recently treated for UTI 12/31 at OSH w/ cefpodoxime but ultimately  culture grew mixed flora. No e/o UTI at this time. Most recently had nephrostomy tubes replaced 12/31. Possibly some leaking from his nephrostomy site and has IR appointment on 1/16.   - IR consult tomorrow if ongoing leaking and need for tube check/exchange     #Constipation: Continue home senna and miralax , increase if needed     #Elevated Tbili: Tbili 1.27 increased from 8 days ago when it was normal. No significant abdominal pain or N/V.   - Dbili added on  - Trend     #Chronic normocytic anemia: Hgb 9.8 stable/increased from prior without e/o acute blood loss. Suspect anemia of chronic disease 2/2 malignancy.  - Trend     #Gout: Continue home allopurinol  100 mg daily     #MDD: Continue home buspirone  20 mg BID     #Insomnia: Continue home trazodone  50 mg nightly     #Diet: Regular diet  #VTE prophylaxis: Heparin   #Proxy decision maker: Darin [place], [phone]  #Code status: Full code    Inpatient Checklist  Fluids: No IV fluids  Electrolytes: Replete PRN   Nutrition:       Diet    Diet Regular       Access: PIVs  VTE: not indicated due to low risk of VTE  Bowel regimen: None.  Foley: No foley  catheter    Code: Full Code     Disposition Planning 01/03/2024:  Remaining discharge needs: None   Discharge location: Home  Outpatient Follow-up Needed: None  Estimated Discharge: >72 hours      Patient was discussed with the attending [place], Michael Espos*, who agrees with the assessment and plan.    Verdon Brunette, MD  Internal Medicine Department, PGY-2  01/03/2024 9:58 AM

## 2024-01-03 NOTE — Interdisciplinary (Signed)
 Physical Therapy Evaluation    Admitting Physician:  Cathay Ozell Riling*  Admission Date 01/02/2024    Inpatient Diagnosis:   Problem List         Codes    Impaired mobility [Z74.09]    -  Primary ICD-10-CM: Z74.09  ICD-9-CM: 799.89            IP Start of Service   Start of Care: 01/03/24  Onset Date: /14/2024  Reason for referral: Activity tolerance limitation;Decline in functional ability/mobility;Range of motion/strength limitations    Preferred Language:English         Past Medical History:   Diagnosis Date    Chronic back pain     Congenital hydronephrosis     Gout     Headache     Hematuria     HTN (hypertension) 12/10/2021    Kidney disease     Kidney stones     Major depressive disorder, single episode     Polyarthropathy or polyarthritis of multiple sites     Retinal detachment     Urethral stricture       Past Surgical History:   Procedure Laterality Date    CT INSERTION OF SUPRAPUBIC CATH  09/25/2015    NEPHRECTOMY Right 1955    APPENDECTOMY      COLONOSCOPY      CYSTOSCOPY      CYSTOSCOPY W/ LASER LITHOTRIPSY      OTHER SURGICAL HISTORY      Interstim 01/29/2011    SPINE SURGERY  09/21    Lumbar-sacral fusion    TRANSURETHRAL RESECTION OF PROSTATE          PT Acute       Row Name 01/03/24 1700          Type of Visit    Type of Physical Therapy note Physical Therapy Evaluation       Row Name 01/03/24 1700          Treatment Precautions/Restrictions    Precautions/Restrictions Fall     Fall Socks/charm       Row Name 01/03/24 1700          Medical History    History of presenting condition Pt is a 73 year old male with a history of congenital R hydronephrosis s/p R nephrectomy, OA, HTN, MDD, gout, metastatic urothelial carcinoma (bones, lymph nodes) s/p L nephrostomy tube currently on PO erdafitinib  c/b diffuse cancer-related pain, recently diagnosed UTI (12/31) s/p nephrostomy tube exchange and cefpodoxime, and recently diagnosed LUE cellulitis (12/27/23) s/p cephalexin who presented w/ diffuse L sided  pain.     Other Past Medical History Information Does have known mets to spine. Last MRI spine was 12/02/23 and showed increased diffuse osseous mets within the spine, ribs, and sacrum without new pathologic fractures as well as possibly increased epidural extension within the midthoracic spine. X-rays from 1/14 confirm no acute fracture       Row Name 01/03/24 1700          Functional History    Prior Level of Function Minimal deficits     Equipment required for mobility in the home Walker;Cane     Other Functional History Information Pt ambulates with SPC for household distances, 4WW for community access       Row Name 01/03/24 1700          Social History    Living Situation Lives with parent/family     Home Environment House     Home accessibility  Accessible with wheelchair or walker     Other Social History Information Pt lives with sister and niece, sister can assist as needed       Row Name 01/03/24 1700          Subjective    Subjective Information Pt received supine in bed; agreeable to PT evaluation.     Patient status Patient agreeable to treatment;Nursing in agreement for treatment       Row Name 01/03/24 1700          Pain Assessment    Pain Asssessment Tool Numeric Pain Rating Scale       Row Name 01/03/24 1700          Numeric Pain Rating Scale    Pain Intensity - rating at present 6     Pain Intensity- rating after treatment 8     Location L leg, hip and back       Row Name 01/03/24 1700          Objective    Overall Cognitive Status Impaired     Other  Cognitive Status Information Pt A&O x 3 (self, situation and date), unable to state location. Pt intermittently confused during evaluation, stating I need to walk but then upset with therapist attempting to initiate standing transfers.     Communication Speech Imparired     Other  Communication Information Dysarthria     Coordination/Motor control No limitations or impairments noted. Movement patterns are fluid and coordinated throughout      Balance Balance limitations present     Static Sitting Balance Good - able to maintain balance without handhold support, limited postural sway     Dynamic Sitting Balance Fair - accepts minimal challenge, able to maintain balance while turning head/trunk     Static Standing Balance Poor - requires handhold support and moderate to maximal assistance to maintain position     Dynamic Standing Balance Not tested     Extremity Assessment Range of motion, strength,  muscle tone and/or sensation limitations present     LUE findings WFL     RUE findings Limited shoulder ROM due to history of mets to R shoulder     LE findings R LE MMT Grade   Hip flexion 2+/5   Knee extension 4/5   Dorsiflexion 4/5   R LE Strength screen    Knee flexion 4/5   Plantarflexion  4/5     L LE MMT Grade   Hip flexion 2+/5, limited by pain   Knee extension 2+/5, limited by pain   Dorsiflexion 4/5   L LE Strength screen    Knee flexion 4/5   Plantarflexion  4/5          Other  Extremity Assessment  Information Pt reported numbness along pattern of pain in L LE     Functional Mobility Functional mobility deficits present     Bed Mobility Moderate assistance (25-50% assistance)     Bed Mobility Comments Pt transferred supine<>sit with mod A for B LE Management with HOB maximally elevated. Prolonged time required for transfer due to pain.     Transfers to/from Stand Moderate assistance (25-50% assistance)     Transfer Comments Pt transferred STS from bed x 3 reps with mod A for initiation and increasing hip extension in standing. Pt required min A for trunk stability in standing with B HHA.     Other Objective Findings Pt mobilized as described above.     Upon  standing transfer, noted patient unable to bear full weight in L LE, maintained L LE in hip and knee flexion with forefoot on ground. RN notified, pt pending MRI.     Pt left supine in bed with all needs, all questions and concerns addressed. RN aware of patient status.                            Eval cont.       Row Name 01/03/24 1700          [place] AM-PAC: Basic Mobility    Assistance Needed to Turn from Back to Side While in a Flat Bed Without Using Bedrails 3 - A little (supervised/min assist)     Difficulty with Supine to Sit Transfer 2 - A lot (mod/max assist)     How Much Help Needed to Move to/from Bed to Chair 2 - A lot (mod/max assist)     Difficulty with Sit to Stand Transfer from Chair with Arms 2 - A lot (mod/max assist)     How Much Help Needed to Walk in Room 1 - Total (dependent)     How Much Help Needed to Climb 3-5 Steps with a Rail 1 - Total (dependent)     AMPAC Total Score 11     Assessment: AM-PAC Basic Mobility Impairment Rating Score 9-12 - 60-79% impaired       Row Name 01/03/24 1700          Patient/Family Education    Learner(s) Patient     Learner response to rehab patient education interventions Verbalizes understanding;Able to return demonstrate teaching     Patient/family training comments PT POC, D/C recommendations       Row Name 01/03/24 1700          Assessment    Assessment Pt is a 73 year old male with a history of congenital R hydronephrosis s/p R nephrectomy, OA, HTN, MDD, gout, metastatic urothelial carcinoma (bones, lymph nodes) s/p L nephrostomy tube currently on PO erdafitinib  c/b diffuse cancer-related pain, recently diagnosed UTI (12/31) s/p nephrostomy tube exchange and cefpodoxime, and recently diagnosed LUE cellulitis (12/27/23) s/p cephalexin who presented w/ diffuse L sided pain. Per pt, he was previously IND with all functional mobility with use of SPC and 4WW, able to ambulate limited community distances. Presently, pt required mod A for all mobility, unable to bear full weight in L LE in standing. Pt presents with pain limiting function, impaired standing balance, decreased L LE strength and decreased activity tolerance. Pt would continue to benefit from skilled therapy in the acute setting to improve activity tolerance, increase strength, and improve  functional mobility.    POST ACUTE REHABILITATION DISCHARGE RECOMMENDATIONS:    Recommended Post-Acute Therapy Follow up:   Ongoing Skilled Therapy after Discharge    If available, recommend discharge to a setting with the following assistance for the listed deficits:     Deficits :   Bed mobility: assist level: Moderate (25-50% assistance)  Transfers: assist level: Moderate (25-50% assistance)      Equipment recommendations:   Hospital bed: Patient demonstrate mobility impairment which limits self-body positioning in bed and requires adjustable bed for adequate pressure relief and positioning  Wheelchair: Patient is unable to safely use a cane or walker for Mobility Related Activities of Daily Living (MRADLs) within the home         Rehab Potential Fair       Row Name  01/03/24 1700          Patient stated Goal    Patient stated goal To walk       Row Name 01/03/24 1700          Goal 1 (Short Term)    Impairment Functional mobility limitation     Custom goal Pt will perform all bed mobility without bed features with SUP     Number of visits 5-7     Goal Status New       Row Name 01/03/24 1700          Goal 2 (Short Term)    Impairment Functional mobility limitation     Custom goal Pt will perform all functional transfers with LRAD and SUP     Number of visits 5-7     Goal Status New       Row Name 01/03/24 1700          Goal 3 (Short Term)    Impairment Gait impairment     Custom goal Pt will ambulate >50 ft with LRAD and SUP     Number of visits 7-10     Goal Status New       Row Name 01/03/24 1700          Planned Therapy Interventions and Rationale    Gait Training to normalize gait pattern and improve safety while ambulating     Neuromuscular Re-Education to improve safety during dynamic activities     Therapeutic Activities to improve transfers between surfaces;to improve functional mobility and ability to navigate in the home and/or community     Theraputic Exercise to increase strength to allow greater  independence with functional mobility skills;to improve activity tolerance to allow greater independence with functional mobility skills       Row Name 01/03/24 1700          Treatment Plan Disussion    Treatment Plan Discussion and Agreement Patient support system determined and all questions were asked and answered       Row Name 01/03/24 1700          Treatment Plan    Continue therapy to address Decline in functional ability/mobility     Frequency of treatment 3 times per week     Duration of treatment (number of visits) While patient is hospitalized and in need of skilled therapy services     Status of treatment Patient evaluated and will benefit from ongoing skilled therapy       Row Name 01/03/24 1700          Patient Safety Considerations     Patient safety considerations Patient returned to bed at end of treatment;Call light left in reach and fall precautions in place;Patient may be at risk for falls;Nursing notified of safety considerations at end of treatment     Patient assistive device requirements for safe ambulation Wheelchair       Row Name 01/03/24 1700          Therapy Plan Communication    Therapy Plan Communication Discussed therapy plan with Nursing and/or Physician       Row Name 01/03/24 1700          Physical Therapy Patient Discharge Instructions    Your Physical Therapist suggests the following Continue to complete your home exercise program daily as instructed;Continue to follow your prescribed mobility precautions when moving in and out of bed and walking  as instructed  Row Name 01/03/24 1700          Type of Eval    Low Complexity [phone]) Completed       Row Name 01/03/24 1700          Therapeutic Procedures    Therapeutic Activities [phone])  Assistance/facilitation of bed mobility;Transfer training with weight shift and direction change;Weight shift activities to improve safety in unsupported sitting or standing;Patient education;Progressive mobilization to improve functional  independence        Total TIMED Treatment (min)  30       Row Name 01/03/24 1700          Treatment Time     Total TIMED Treatment  (min) 30     Total Treatment Time (min) 45     Treatment start time 1535                        The physical therapist of record is endorsed by evaluating physical therapist.

## 2024-01-03 NOTE — Event / Update (Signed)
 Stephen [name] is a 73 year old male with cT1N0M1 urothelial carcinoma with widespread osseous metastasis on erdafitinib , s/p palliative RT to R shoulder (10/18/23),  he was seen recently as an outpatient for back pain involving both the lower back and upper back radiating down his arms with evidence of epidural extension in the mid T-spine along with severe foraminal stenosis of C7-T2 that may be contributing to his upper extremity weakness, he was planned for outpatient radiation to start on 01/10/24, now presenting with 3 days of worsening radiating back pain, admitted for pain management     Discussed with treating team. We will plan to move up his planned single fraction treatment to 01/04/24 at 4:45pm. Transport will be arranged.

## 2024-01-03 NOTE — Interdisciplinary (Signed)
 01/03/24 1015   Readmission Risk Assessment   Where was the patient admitted from? * Home   Admin Use Only 1   No action needed by CMs 2 1   Readmission Within 30 Days of Discharge * Yes   Admission Was* Unplanned   Recent Hospitalizations (Within Last 6 Months) * Yes   High Risk For Readmission * Yes   1. Is your preferred language in EPIC correct? Yes   2. What do you think brought you back to the hospital? Pain   3.Was this a symptom you had during the last stay? No   If No- Onset? Specific symptom? pain   4. Do you feel like you were given enough education before leaving the hospital? Yes   5. Did you understand the AVS information?  Yes   6. Did you get verbal instructions in your preferred language? Yes   If Yes- Was Eward used? No   7. Did you follow the instruction on the AVS? Yes   8. Was HH arranged upon discharge during your last hospital visit?  Yes   If Yes- Did you receive the scheduled HH visit? Yes   9. Did you have enough help/support at home?  Yes   10. Did you get your discharge medications? Yes   11. Did you take your medications as prescribed/instructed? Yes   12. Did you see your PCP or Specialist before coming back to the hospital? Yes   13. Did you receive a 48hr post discharge phone call?  Yes   If Yes- How many phone calls did you receive since your last visit?  1   14. Based on your assessment after meeting with the pt and answering all the above questions, what do you think the primary reason is for this unplanned admission?   Pain not under control   Patient Information   Primary Family/Caregiver Contact Name, Number and Relationship DEWAINE Males Evert/sister [phone]   Permission to Contact * Yes   Stairs/Steps to home * No   Address on Facesheet correct?* Yes   Patient contact phone number on Facesheet correct? * Yes   PCP listed on Facesheet correct? * Yes   Assistive Device * Walker;Cane   Home Care Services * Yes   Type of Home Care Services on Admission Home PT   Patient's  Discharge Goal(s) * Home;Home Health   Income Information   Do you have difficulty affording your medications * No   Patient Transportation   Do You Have Transportation Issues/Concerns That Make It Difficult To Get To Your Appointments? * No   Will someone be coming to the hospital to take you home upon discharge?  No  (Need transportation to be arranged through his insurance)   Discharge   Anticipated Discharge Disposition/Needs * Home with Family;HH PT;HH RN   Barriers to Discharge * None   Family/Caregiver's Assessed for * Not Applicable   Respite Care * Not Applicable   Food Insecurity   Within the past 12 months, you worried that your food would run out before you got the money to buy more. Never true   Within the past 12 months, the food you bought just didn't last and you didn't have money to get more. Never true   Transportation Needs   In the past 12 months, has lack of transportation kept you from medical appointments or from getting medications? no   In the past 12 months, has lack of transportation kept you from  meetings, work, or from getting things needed for daily living? No   Housing Stability   In the last 12 months, was there a time when you were not able to pay the mortgage or rent on time? N   In the past 12 months, how many times have you moved where you were living? 0   At any time in the past 12 months, were you homeless or living in a shelter (including now)? N   Utilities   In the past 12 months has the electric, gas, oil, or water  company threatened to shut off services in your home? No   Social Worker Consult   Do you need to see a child psychotherapist? * No   Initial Assessment   CM Initial Assessment * Completed     Medical Necessity:  Chief Complaint   Patient presents with    Leg Pain     Leg pain x3 days. Hx of kidney CA. Pain different from chemo pains. Home meds not helping.       LOS at time of Initial Assessment: 23 Hours  Pt admitted on 01/02/2024 10:35 AM    LACE+ Score: 70    Address  verified as discharge address:   [address] [place] [address] [phone] (home)    PCP verified:  Clinic, [facility]  [address] [place] [address]  telephone [phone]  fax [phone]    Pharmacy:  [facility]    [name], explained the role and verified demography.     PLOF:  He lives in [facility] with his sister and his niece, IADLs.    Hx of SNF placement:  None    Hx of Home health services:  [facility] for home PT   - Use previous home health agency upon discharge (yes/no): yes    DME (includes ALL home equipment): cane and walker    Hx of HD/PD: None  Maintenance Dialysis History    Patient has no recorded history of maintenance dialysis.          DISCHARGE PLANNING    Support system:  family    Anticipated DC disposition (home, SNF, etc) :  home    Anticipated DC needs (HH, DME, none, etc):  Home health    Anticipated barriers to discharge:   None    Transportation: Will need transportation to be arranged through his insurance.             Expected discharge date:  3-4 days.       Paola Stakes, RN

## 2024-01-03 NOTE — Interdisciplinary (Signed)
 01/03/24 2159   Assessment   Assessment Type Initial;Verbal;Face to Face;Readmission   Social Assessment   Where was the patient admitted from? * Home   Mode of Arrival Ambulance   Prior to Level of Function * Independent with ADL's   Assistive Device * Cane;Walker   Primary Family/Caregiver Contact Name, Number and Relationship DEWAINE Males Evert/sister [phone]   Permission to Contact * Yes   Interpreter Used? Not Needed   Literacy Can write;Can read   Living Arrangements on Admission* Family Member   Post Acute Services Referred To TBD   A List of Production Designer, Theatre/television/film Provided Not Applicable   Type of Residence * One Story Home   Home Care Services * Yes   Type of Home Care Services on Admission Home PT   Patient's Discharge Goal(s) * Home;Home Health   Do You Have Transportation Issues/Concerns That Make It Difficult To Get To Your Appointments? * No   Family/Caregiver's Assessed for * Readiness, willingness, and ability to provide or support self-management activities   Respite Care * Not Applicable   Medication Compliance Adheres to medication   Involvement with Law None   Income Information   Income Source Electrical Engineer History Not Applicable   Veterans Affiliation No   Do you have difficulty affording your medications * No   Mental Health Assessment   Mental Status No issues   Behavioral Assessment No issues   Physical Assessment No issues   Mental Status - Orientation Patient alert and oriented x2-3 as patient poor historian   Have you experienced any neglect or abuse in the past? No   Past Neglect or Abuse re-assessed at discharged No reported past neglect or abuse   Notify Treatment Team to Assess Patient's Capacity? No concerns at this time   Plans/Interventions/Discharge   Barriers to Discharge * Clinical reason   Discharge Information for Patient - THIS GROUP FILES TO THE PATIENT'S AVS   Social Worker Name and Phone Number:  Annitta Rollene HUGHS [phone]       SOCIAL WORK: SWer responding to readmission assessment.    SWer met with patient at bedside to introduce self, role and to complete readmission assessment.  Patient agreeable to speaking with this provider at this time.     REFERRAL SOURCE: Chart reviewed- readmission w/I 30days, patient was last at [facility] 12/27/2023 for cellulitis of left upper extremity.    INFORMATION: Per chart review, patient is a 73 year old male with metastatic urothelial carcinoma presenting uncontrolled pain secondary to metastases. Patient is followed by outpatient palliative care for symptom management. Patient has had radiation therapy.     LIVING SITUATION/SUPPORT: Patient resides at home with sister and niece in [facility].    FAMILY: Patient with supportive sister involved in his care.    INCOME: Unknown at this time the amount.     SUBSTANCE USE: Patient with no current or past use of AOD reported.        Annitta Rollene CALENDER, LCSW  Clinical Social Worker III  [facility]   Emergency Department  Mobile: [phone]

## 2024-01-04 ENCOUNTER — Other Ambulatory Visit (HOSPITAL_BASED_OUTPATIENT_CLINIC_OR_DEPARTMENT_OTHER): Payer: Self-pay

## 2024-01-04 ENCOUNTER — Ambulatory Visit (HOSPITAL_BASED_OUTPATIENT_CLINIC_OR_DEPARTMENT_OTHER): Payer: Medicare Other

## 2024-01-04 ENCOUNTER — Ambulatory Visit
Admission: RE | Admit: 2024-01-04 | Payer: Medicare Other | Source: Ambulatory Visit | Admitting: Vascular & Interventional Radiology

## 2024-01-04 ENCOUNTER — Inpatient Hospital Stay (HOSPITAL_BASED_OUTPATIENT_CLINIC_OR_DEPARTMENT_OTHER): Payer: Medicare Other

## 2024-01-04 ENCOUNTER — Encounter (HOSPITAL_COMMUNITY): Admission: EM | Disposition: A | Discharge status: Home Health | Payer: Self-pay | Attending: Internal Medicine

## 2024-01-04 DIAGNOSIS — C679 Malignant neoplasm of bladder, unspecified: Secondary | ICD-10-CM

## 2024-01-04 DIAGNOSIS — N1330 Unspecified hydronephrosis: Secondary | ICD-10-CM | POA: Insufficient documentation

## 2024-01-04 LAB — CANDIDA AURIS NUCLEIC ACID DETECTION TEST: Candida auris Nucleic Acid Detection Test: NOT DETECTED

## 2024-01-04 SURGERY — IR CHNG NEPHROSTOMY TUBE
Anesthesia: Local

## 2024-01-04 MED ORDER — IODIXANOL 320 MG/ML IV SOLN
INTRAVENOUS | Status: AC
Start: 2024-01-04 — End: 2024-01-04
  Filled 2024-01-04: qty 50

## 2024-01-04 MED ORDER — IODIXANOL 320 MG/ML IV SOLN
INTRAVENOUS | Status: DC | PRN
Start: 2024-01-04 — End: 2024-01-04
  Administered 2024-01-04: 5 mL

## 2024-01-04 MED ORDER — HYDROMORPHONE HCL 1 MG/ML IJ SOLN
1.0000 mg | INTRAMUSCULAR | Status: AC | PRN
Start: 2024-01-04 — End: 2024-01-05
  Administered 2024-01-04 (×3): 1 mg via INTRAVENOUS
  Filled 2024-01-04 (×3): qty 1

## 2024-01-04 SURGICAL SUPPLY — 10 items
BAG DRAINAGE NEPHROSTOMY 600ML (Misc Medical Supply) IMPLANT
COVER PROBE ISOSILK 6" X 96" INTRA OPERATIVE (Drapes/towels) IMPLANT
DECANTER BAG-A-JET STERILE (Misc Medical Supply) IMPLANT
DRESSING SPONGE 2X2X4 PLY (Dressings/packing) ×1 IMPLANT
DRESSING SPONGE IV 2X2 STRL (Dressings/packing) ×1 IMPLANT
DRESSING TEGADERM HP 4X4 (Dressings/packing) ×1 IMPLANT
GUIDEWIRE STARTER J-CURVED FIXED CORE 0.035" X 150CM, 8CM TAPER, 15MM J TIP (Procedural wires/sheaths/catheters/balloons/dilators) IMPLANT
PROCEDURE PACK - IR NON-VASCULAR (Procedure Packs/kits) IMPLANT
SOLUTION IV 0.9% NS 500ML (Non-Pharmacy Meds/Solutions) ×1 IMPLANT
TOWELS OR BLUE 4-PACK STERILE, DISPOSABLE (Drapes/towels) IMPLANT

## 2024-01-04 NOTE — RN OR/Procedure Note (Signed)
 Left neph tube checked, no leakage per PA. Bag and dessing changed. Clean, dry and intact

## 2024-01-04 NOTE — Progress Notes (Signed)
 Medicine Progress Note    Current Hospital Stay:   2 days - Admitted on: 01/02/2024    ID: Stephen [name] is a 73 year old male with a history of congenital R hydronephrosis s/p R nephrectomy, OA, HTN, MDD, gout, metastatic urothelial carcinoma (bones, lymph nodes) s/p L nephrostomy tube currently on PO erdafitinib  c/b diffuse cancer-related pain, recently diagnosed UTI (12/31) s/p nephrostomy tube exchange and cefpodoxime, and recently diagnosed LUE cellulitis (12/27/23) s/p cephalexin who presented w/ diffuse L sided pain.    Subjective / Interval Events   Interval/Subjective:  -Pt continues to be in pain; discussed giving more IV pain meds to better control symptoms  -Radiation therapy unfortunately moved pending MRI first       Scheduled Medications   allopurinol   100 mg Oral Daily 100 mg at 01/04/24 0835    busPIRone   20 mg Oral BID 20 mg at 01/04/24 9164    DULoxetine   20 mg Oral BID 20 mg at 01/03/24 2124    fluticasone  propionate  2 spray Each Naris Daily 2 spray at 01/04/24 9075    gabapentin   400 mg Oral TID 400 mg at 01/04/24 9366    heparin   5,000 Units Subcutaneous Q8H 5,000 Units at 01/04/24 9365    multivitamin with minerals  1 tablet Oral Daily 1 tablet at 01/04/24 0835    nystatin   5 mL Swish & Swallow 4x Daily 500,000 Units at 01/04/24 9366    polyethylene glycol  17 g Oral Daily 17 g at 01/03/24 0825    senna  17.2 mg Oral Daily 17.2 mg at 01/03/24 0825    sodium chloride   3 mL IntraVENOUS Q8H 3 mL at 01/04/24 0634     Continuous Medications   sodium chloride          PRN Medications   artificial tears  1 drop Both Eyes PRN    HYDROmorphone   1 mg IntraVENOUS Q4H PRN    HYDROmorphone   4 mg Oral Q4H PRN    nalOXone   0.1 mg IntraVENOUS Q2 Min PRN    prochlorperazine   10 mg Oral Q6H PRN    sodium chloride   3 mL IntraVENOUS PRN    sodium chloride    IntraVENOUS Continuous PRN    tizanidine   2 mg Oral Q8H PRN    traZODone   50 mg Oral Nightly PRN       Objective   Vitals   Latest Entry  Range (last 24  hours)    Temperature: 97.4 F (36.3 C)  Temp  Avg: 97.9 F (36.6 C)  Min: 97.4 F (36.3 C)  Max: 98.6 F (37 C)    Blood pressure (BP): 105/63  BP  Min: 102/46  Max: 114/46    Heart Rate: 74  Pulse  Avg: 64  Min: 51  Max: 74    Respirations: 20  Resp  Avg: 18  Min: 17  Max: 20    SpO2: 94 %  SpO2  Avg: 94.8 %  Min: 93 %  Max: 96 %       No data recorded     Intake/Output (Last day):  01/15 0600 - 01/16 0559  In: 300 [P.O.:300]  Out: 1200 [Urine:1200]  01/03/2024 0600 - 01/04/2024 0600  Urine x0  Stool x0  Emesis x0       Weights:  Weights (last 14 days)       Date/Time Weight Weight Source Percentage Weight Change (%) Who    01/02/24 1111 73.5 kg (162 lb)  Bed scale 0 % KL            Physical Exam:  General: NAD, Alert and oriented, well-developed, well-nourished  HEENT: PERRLA, EOMI, sclera anicteric, MMM, pharynx w/out erythema or exudate  Neck: Supple, trachea midline, no LAD, no thyromegaly  CV: RRR, S1/S2 normal, no r/m/g, no JVD, no carotid bruit, no pedal edema  Lungs: CTAB, no rales/rhonchi/wheezing, no increased wob, no accessory muscle use  Abdomen: Soft, NTND, no HSM, no masses, BS+, no guarding, no rebound  Extremities: DP/PT 2+, warm, well-perfused, no edema/cyanosis/clubbing  MSK: No joint erythema, tenderness, or effusions. Pain predominantly in low back and radiating down to legs.  Neuro: CN II-XII grossly intact, no focal deficits  Skin: No obvious rashes or lesions, no jaundice  Psych: Normal mood and affect, no SI/HI    Basic Labs:   BMP:  CBC:   Recent Labs     01/02/24  1215 01/03/24  0440   NA 136 138   K 4.1 3.9   CL 93* 97*   BICARB 28 30*   BUN 22 22   CREAT 1.11 1.26*   GLU 109* 117*   CA 10.2 9.6   MG  --  1.8   PHOS  --  6.5*     Ca 9.6 (01/15) Mg 1.8 (01/15) Phos 6.5* (01/15)    Recent Labs     01/02/24  1215 01/03/24  0440   WBC 7.9 5.8   HGB 9.8* 8.6*   HCT 28.8* 26.1*   PLT 192 171   SEG 75.7 67.1   LYMPHS 14.1 19.8   MONOS 9.0 10.2        Coags:  LFTs:   No results for input(s):  PTT, INR, PT in the last 72 hours.   Recent Labs     01/02/24  1215 01/03/24  0440   ALK 104 85   AST 20 21   ALT 13 10   TBILI 1.27* 1.01   DBILI 0.3* 0.3*   TP 6.6 5.7*   ALB 3.4* 3.0*        Microbiology  None    Imaging:   X-Ray Femur Minimum 2 Views - Left    Result Date: 01/02/2024  IMPRESSION: No acute displaced fracture or dislocation. Redemonstration of multifocal osteosclerotic metastases of the left hemipelvis and proximal femur, better demonstrated on prior CT abdomen pelvis.    X-Ray Hip Unilateral With Pelvis 2 or 3 Views - Left    Result Date: 01/02/2024  IMPRESSION: No acute displaced fracture or dislocation. Redemonstration of multifocal osteosclerotic metastases of the imaged portion of the lower lumbar spine, pelvis, and left proximal femur, better demonstrated on prior CT from 11/28/2023. Redemonstration of lumbosacral hardware, incompletely evaluated.    X-Ray Foot Complete Minimum 3 Views - Left    Result Date: 01/02/2024  IMPRESSION: No acute displaced fracture or dislocation. Bones are demineralized but no osteolytic or osteoblastic lesions. Minimal plantar calcaneal enthesopathy. Moderate degenerative changes of the first metatarsophalangeal joint. 2nd through fifth hammertoe deformities on this nonweightbearing study. Vascular calcifications are present.    X-Ray Tibia & Fibula 2 Views - Left    Result Date: 01/02/2024  IMPRESSION: No acute displaced fracture or dislocation. No osteolytic or osteoblastic lesions. No radiographic evidence of osteonecrosis. Vascular calcifications are present.       EKG:  No components found for: QTCINTERVAL     Procedures: None.     Assessment / Plan   Assessment and Care Plan:  Stephen [name] is a 73 year old male with a history of congenital R hydronephrosis s/p R nephrectomy, OA, HTN, MDD, gout, metastatic urothelial carcinoma (bones, lymph nodes) s/p L nephrostomy tube currently on PO erdafitinib  c/b diffuse cancer-related pain, recently diagnosed  UTI (12/31) s/p nephrostomy tube exchange and cefpodoxime, and recently diagnosed LUE cellulitis (12/27/23) s/p cephalexin who presented w/ diffuse L sided pain suspected 2/2 cancer.      #Diffuse left sided pain  #Cancer-related pain:   Followed by Palliative Care for cancer-related pain, last seen 1/6 w/ medications adjusted slightly (stopped pregabalin  and changed gabapentin  to 300 mg TID from BID). Reported taking 4 mg dilaudid  once to twice daily at this time for pain in his L shoulder, low back, and neck. Now reporting worsening pain in his L side from the foot up to the shoulder. On exam, pt has 3/5 strength w/ L hip flexion and 4/5 strength w/ L plantar/dorsiflexion. 4+/5 on the R. Difficult to say whether this is related to pain or is true weakness. Does have known mets to spine. Last MRI spine was 12/02/23 and showed increased diffuse osseous mets within the spine, ribs, and sacrum without new pathologic fractures as well as possibly increased epidural extension within the midthoracic spine. Reports he was previously able to ambulate but couldn't get out of bed. Is scheduled to receive radiation to his back. Discussed case with radiation-oncology, will plan for radiation of his spine after MRI spine survey completed.  -MRI spine survey   -Called MRI to see if patient could get MRI expedited but unfortunately very backed up so tentatively will happen 1/17-1/18   -Per rad onc, will plan for radiation tomorrow if MRI gets done  - For now, pain control:              - Continue home duloxetine  20 mg BID              - Increased gabapentin  to 400 mg TID              - Continue home tizanidine  2 mg Q8H PRN              - Dilaudid  4 mg PO Q4H PRN for moderate to severe pain              - Dilaudid  1 mg IV Q4H PRN for rescue              - Previously declined tylenol  and lidocaine  patches; can re-trial this today  -MRI spine survey ordered  -PT ordered     #Metastatic urothelial carcinoma: Followed by Dr. Jackquline.  Known mets to bones, diffuse. Had XRT to shoulder previously and is planned for XRT to his back soon - scheduled on 1/22 but after reaching out to rad onc today, can try to expedite it to sometime this week. On erdafitinib . Some talk of hospice per Palliative Care notes but patient not wanting this at this time and prefers to continue treatment.  -Will reach out to Dr. Jackquline to notify him of admission  -Hold home erdafitinib  6 mg daily for now     #S/p L nephrostomy tube placement c/b  #?Leaking  #Recurrent UTI's: Recently treated for UTI 12/31 at OSH w/ cefpodoxime but ultimately culture grew mixed flora. No e/o UTI at this time. Most recently had nephrostomy tubes replaced 12/31. Possibly some leaking from his nephrostomy site and has IR appointment on 1/16. Consulted IR - appears patient with hx of  leaking from the tube (2 weeks ago tube was exchanged and upsized) - ok to monitor from their end as long as he's still having output and not leaking significantly.  -Cont to monitor PCN  -No intervention from IR currently     #Constipation: Continue home senna and miralax , increase if needed     #Elevated Tbili: Tbili 1.27 increased from 8 days ago when it was normal. No significant abdominal pain or N/V.   - Dbili added on  - Trend     #Chronic normocytic anemia: Hgb 9.8 stable/increased from prior without e/o acute blood loss. Suspect anemia of chronic disease 2/2 malignancy.  - Trend     #Gout: Continue home allopurinol  100 mg daily     #MDD: Continue home buspirone  20 mg BID     #Insomnia: Continue home trazodone  50 mg nightly     #Diet: Regular diet  #VTE prophylaxis: Heparin   #Proxy decision maker: Darin [place], [phone]  #Code status: Full code    Inpatient Checklist  Fluids: No IV fluids  Electrolytes: Replete PRN   Nutrition:       Diet    Diet Regular       Access: PIVs  VTE: not indicated due to low risk of VTE  Bowel regimen: None.  Foley: No foley catheter    Code: Full Code     Disposition  Planning 01/04/2024:  Remaining discharge needs: None   Discharge location: Home  Outpatient Follow-up Needed: None  Estimated Discharge: >72 hours      Patient was discussed with the attending [place], Michael Espos*, who agrees with the assessment and plan.    Verdon Brunette, MD  Internal Medicine Department, PGY-3  01/03/2024 9:58 AM

## 2024-01-05 ENCOUNTER — Other Ambulatory Visit: Payer: Self-pay | Admitting: Cardiovascular Disease

## 2024-01-05 ENCOUNTER — Inpatient Hospital Stay (HOSPITAL_BASED_OUTPATIENT_CLINIC_OR_DEPARTMENT_OTHER): Payer: Medicare Other

## 2024-01-05 ENCOUNTER — Ambulatory Visit (HOSPITAL_BASED_OUTPATIENT_CLINIC_OR_DEPARTMENT_OTHER): Admit: 2024-01-05 | Discharge: 2024-01-05 | Discharge status: Home Health | Payer: Medicare Other

## 2024-01-05 ENCOUNTER — Other Ambulatory Visit (HOSPITAL_BASED_OUTPATIENT_CLINIC_OR_DEPARTMENT_OTHER): Payer: Self-pay

## 2024-01-05 ENCOUNTER — Encounter (HOSPITAL_BASED_OUTPATIENT_CLINIC_OR_DEPARTMENT_OTHER): Payer: Self-pay | Admitting: Student in an Organized Health Care Education/Training Program

## 2024-01-05 ENCOUNTER — Encounter (HOSPITAL_BASED_OUTPATIENT_CLINIC_OR_DEPARTMENT_OTHER): Payer: Self-pay | Admitting: Radiation Oncology

## 2024-01-05 DIAGNOSIS — M4316 Spondylolisthesis, lumbar region: Secondary | ICD-10-CM

## 2024-01-05 DIAGNOSIS — C689 Malignant neoplasm of urinary organ, unspecified: Secondary | ICD-10-CM

## 2024-01-05 DIAGNOSIS — R937 Abnormal findings on diagnostic imaging of other parts of musculoskeletal system: Secondary | ICD-10-CM

## 2024-01-05 DIAGNOSIS — R9089 Other abnormal findings on diagnostic imaging of central nervous system: Secondary | ICD-10-CM

## 2024-01-05 DIAGNOSIS — Z9889 Other specified postprocedural states: Secondary | ICD-10-CM

## 2024-01-05 DIAGNOSIS — C7951 Secondary malignant neoplasm of bone: Secondary | ICD-10-CM

## 2024-01-05 DIAGNOSIS — M4317 Spondylolisthesis, lumbosacral region: Secondary | ICD-10-CM

## 2024-01-05 LAB — ~~LOC~~ RAD ONC ARIA SESSION SUMMARY
Course Elapsed Days: 0
Course ID: 3
Plan Fractions Treated to Date: 1
Plan ID: 1
Plan Prescribed Dose Per Fraction: 800 cGy
Plan Total Fractions Prescribed: 1
Plan Total Prescribed Dose: 800 cGy
Reference Point Dosage Given to Date: 800 cGy
Reference Point Session Dosage Given: 800 cGy
Session Number: 1

## 2024-01-05 LAB — ~~LOC~~ RAD ONC ARIA COURSE SUMMARY
Course Elapsed Days: 0
Course ID: 2
Plan Fractions Treated to Date: 1
Plan ID: 1
Plan Prescribed Dose Per Fraction: 800 cGy
Plan Total Fractions Prescribed: 1
Plan Total Prescribed Dose: 800 cGy
Reference Point Dosage Given to Date: 800 cGy

## 2024-01-05 LAB — CBC WITH DIFF, BLOOD
ANC-Automated: 7.1 10*3/uL — ABNORMAL HIGH (ref 1.6–7.0)
Abs Basophils: 0 10*3/uL (ref <0.2)
Abs Eosinophils: 0.1 10*3/uL (ref 0.0–0.5)
Abs Lymphs: 1.2 10*3/uL (ref 0.8–3.1)
Abs Monos: 0.5 10*3/uL (ref 0.2–0.8)
Basophils: 0.2 %
Eosinophils: 0.6 %
Hct: 27.1 % — ABNORMAL LOW (ref 40.0–50.0)
Hgb: 9 g/dL — ABNORMAL LOW (ref 13.7–17.5)
Imm Gran %: 0.4 % (ref <1)
Imm Gran Abs: 0 10*3/uL (ref <0.1)
Lymphocytes: 13.6 %
MCH: 30.9 pg (ref 26.0–32.0)
MCHC: 33.2 g/dL (ref 32.0–36.0)
MCV: 93.1 um3 (ref 79.0–95.0)
MPV: 9.5 fL (ref 9.4–12.4)
Monocytes: 5.8 %
Plt Count: 237 10*3/uL (ref 140–370)
RBC: 2.91 10*6/uL — ABNORMAL LOW (ref 4.60–6.10)
RDW: 13.7 % (ref 12.0–14.0)
Segs: 79.4 %
WBC: 8.9 10*3/uL (ref 4.0–10.0)

## 2024-01-05 LAB — COMPREHENSIVE METABOLIC PANEL, BLOOD
ALT (SGPT): 14 U/L (ref 0–41)
AST (SGOT): 27 U/L (ref 0–40)
Albumin: 3 g/dL — ABNORMAL LOW (ref 3.5–5.2)
Alkaline Phos: 100 U/L (ref 40–129)
Anion Gap: 11 mmol/L (ref 7–15)
BUN: 26 mg/dL — ABNORMAL HIGH (ref 8–23)
Bicarbonate: 29 mmol/L (ref 22–29)
Bilirubin, Tot: 0.8 mg/dL (ref <1.2)
Calcium: 9.3 mg/dL (ref 8.5–10.6)
Chloride: 94 mmol/L — ABNORMAL LOW (ref 98–107)
Creatinine: 1.22 mg/dL — ABNORMAL HIGH (ref 0.67–1.17)
Glucose: 138 mg/dL — ABNORMAL HIGH (ref 70–99)
Potassium: 4.1 mmol/L (ref 3.5–5.1)
Sodium: 134 mmol/L — ABNORMAL LOW (ref 136–145)
Total Protein: 6.3 g/dL (ref 6.0–8.0)
eGFR Based on CKD-EPI 2021 Equation: >60 mL/min/{1.73_m2}

## 2024-01-05 LAB — MRSA SURVEILLANCE CULTURE

## 2024-01-05 MED ORDER — HYDROMORPHONE HCL 1 MG/ML IJ SOLN
1.0000 mg | INTRAMUSCULAR | Status: AC | PRN
Start: 2024-01-05 — End: 2024-01-06
  Administered 2024-01-05 – 2024-01-06 (×3): 1 mg via INTRAVENOUS
  Filled 2024-01-05 (×3): qty 1

## 2024-01-05 MED ORDER — GADOBUTROL 1 MMOL/ML IV SOLN (WRAPPED RECORD)
7.0000 mL | Freq: Once | INTRAVENOUS | Status: AC
Start: 2024-01-05 — End: 2024-01-05
  Administered 2024-01-05: 7 mL via INTRAVENOUS
  Filled 2024-01-05: qty 7

## 2024-01-05 NOTE — Interdisciplinary (Signed)
 Nutrition Note      Evaluation Type: Initial    Recommendations:    -Patient meets criteria for Moderate Malnutrition  - continue current liberalized regular diet with Novasource renal bid and encourage PO    ** if continues high P, may restrict 1g phos  - continue daily MVM   - wt every 48hrs as ordered                                                                                                                                         A: Per MD: 73 year old male with a history of congenital R hydronephrosis s/p R nephrectomy, OA, HTN, MDD, gout, metastatic urothelial carcinoma (bones, lymph nodes) s/p L nephrostomy tube currently on PO erdafitinib  c/b diffuse cancer-related pain, recently diagnosed UTI (12/31) s/p nephrostomy tube exchange and cefpodoxime, and recently diagnosed LUE cellulitis (12/27/23) s/p cephalexin who presented w/ diffuse L sided pain suspected 2/2 cancer.      Nutrition Summary   Current Nutrition Regimen:   Diet Regular  Nutritional Supplement - Oral Novasource Renal; Strawberry ; Deliver Supplements: BID  Source of Information: Chart Review;Spoke with patient  Barriers to Intake: Decreased appetite  Additional Comments: met pt in ED room; eating decent amount, no N/V; high P may r/t bone met, drinking Nepro at home, accpets Npvasource renal, high end of Vit D  Adequacy of Nutrition Intake: Meeting 50-75% of estimated needs    Tray Items Taken for the past 168 hrs:   Number of Items Taken Number of Items on Tray Diet Tolerance   01/03/24 0945 2 4 Tolerates   01/03/24 1200 3.5 6 Tolerates   01/04/24 1000 5 3 Tolerates     Supplement Intake for the past 168 hrs:   Diet Supplements Supplement  - Intake (mL) Supplement Tolerance   01/02/24 1800 Novasource Renal Supplement -- --   01/03/24 0800 Novasource Renal Supplement 0 mL --   01/03/24 1633 Novasource Renal Supplement 0 mL --   01/05/24 0800 -- 240 mL Tolerated well       Anthropometrics   Height - Most Recent Measurement   01/02/24 5' 6  (1.676 m)       Weight For Nutrition Equations: 68 kg (149 lb 14.6 oz) (EDW; consideration with small ascites last admission)  Weight Trends: likely losing wt, but unable to assess accurate trend  BMI for Nutrition Calculations: 24.19  Ideal Body Weight (kg): 64.36  Percent of Ideal Body Weight: 105.65 %  Usual Body Weight (Dietary): 73 kg (160 lb 15 oz) (in Sep per EMR)  Change from UBW (%): -6.85 %  Time Frame of Weight Change From UBW: 3-6 months    Weights (last 14 days)       Date/Time Weight Weight Source Percentage Weight Change (%) Who    01/02/24 1111 73.5 kg (162 lb) Bed scale 0 % KL  Wt Readings from Last 20 Encounters:   01/02/24 73.5 kg (162 lb)   12/25/23 70.9 kg (156 lb 6.4 oz)   12/04/23 74.4 kg (164 lb)   11/23/23 72.3 kg (159 lb 8 oz)   11/06/23 71.6 kg (157 lb 12.8 oz)   10/18/23 74.3 kg (163 lb 12.8 oz)   10/09/23 76.4 kg (168 lb 8 oz)   09/21/23 75.9 kg (167 lb 6.4 oz)   09/04/23 74.7 kg (164 lb 11.2 oz)   08/24/23 73.7 kg (162 lb 6.3 oz)   08/24/23 73.7 kg (162 lb 6.4 oz)   08/14/23 73.2 kg (161 lb 4.8 oz)   08/10/23 72.6 kg (160 lb)   07/27/23 72 kg (158 lb 11.7 oz)   07/27/23 73 kg (160 lb 15 oz)   07/20/23 74.2 kg (163 lb 9.3 oz)   07/20/23 74.2 kg (163 lb 9.3 oz)   07/20/23 74.2 kg (163 lb 9.3 oz)   07/06/23 71.9 kg (158 lb 8.2 oz)   07/06/23 71.9 kg (158 lb 8.2 oz)       Estimated Needs  Calories: 1700 kcal/day - 2040 kcal/day (25 kcal/kg/day - 30 kcal/kg/day x 68 kg (149 lb 14.6 oz))  Protein: 68 g/day - 102 g/day (1 g/kg/day - 1.5 g/kg/day x 68 kg (149 lb 14.6 oz))  Fluids: 2040 mL/day - 2380 mL/day (30 mL/kg/day - 35 mL/kg/day x 68 kg (149 lb 14.6 oz)) or per MD    Elinda Oliphant Equation: 1387  [employer] Equation:    [employer] Equation Used:    Indirect Calorimetry Results:      Nutrition Focused Physical Exam   Body Fat Loss    Orbital (!) Moderate (deferred today as pt eating lunch upon visit; RD assessment on 12/13/23, visually the same) (01/05/24 1111)   Upper Arm (!)  Severe (01/05/24 1111)   Thoracic/Lumbar (!) Moderate (01/05/24 1111)   Muscle Mass Loss    Temple (!) Severe (01/05/24 1111)   Clavicle Bone Region (!) Moderate (01/05/24 1111)   Deltoid (!) Severe (01/05/24 1111)   Scapula Bone Region (!) Severe (01/05/24 1111)   Interosseous (!) Severe (01/05/24 1111)   Anterior Thigh (!) Moderate (01/05/24 1111)   Patellar Region (!) Moderate (01/05/24 1111)   Posterior Calf (!) Moderate (01/05/24 1111)   Micronutrient Deficiency       Edema:         Malnutrition Diagnostic Criteria - Chronic Illness  Malnutrition Designation: Moderate  Energy Intake: not applicable  Weight Loss: 10% in 6 Months  Fluid Accumulation: Mild  Body Fat Loss: Severe  Muscle Mass Loss: Severe  Chronic Dx Status: Ongoing    Clinical Considerations:   Allergies: Sulfa drugs  IV Access - Peripheral:     IV Access - Central  Port A Cath Single Lumen  - 02/13/23 Right Internal jugular (Active)     Tubes and Drains:  Nephrostomy -  Left (Active)        GI:  Stool Assessment for the past 168 hrs:   Stool Occurrence   01/03/24 1000 0       Skin Integrity:  Skin Integrity (WDL): Within Defined Limits       Wounds/Incisions:       Pressure Injuries:       Labs: reviewed   Recent Labs     01/03/24  0440 01/05/24  0904   NA 138 134*   K 3.9 4.1   CL 97* 94*   BICARB 30* 29  BUN 22 26*   CREAT 1.26* 1.22*   GLU 117* 138*   CA 9.6 9.3   MG 1.8  --    PHOS 6.5*  --    ALK 85 100   ALT 10 14   AST 21 27   TBILI 1.01 0.80   DBILI 0.3*  --    ALB 3.0* 3.0*   WBC 5.8 8.9   ABSNEUTRO 3.9 7.1*       Lab Results   Component Value Date    CHOL 130 09/21/2023    TRIG 125 09/21/2023       No results found for: A1C    No results for input(s): GLUCPOCT in the last 72 hours.    Lab Results   Component Value Date    VITAMIND25HY 77 03/23/2023       Medication Review Comments:    IV:    sodium chloride            Scheduled:    allopurinol   100 mg Daily    busPIRone   20 mg BID    DULoxetine   20 mg BID    fluticasone  propionate   2 spray Daily    gabapentin   400 mg TID    heparin   5,000 Units Q8H    multivitamin with minerals  1 tablet Daily    nystatin   5 mL 4x Daily    polyethylene glycol  17 g Daily    senna  17.2 mg Daily    sodium chloride   3 mL Q8H       Discharge: pending clinical course    Education: when clinically appropriate     RD/DTR to monitor/evaluate: labs, weight trends, oral or nutrition support status, and signs and symptoms of new skin concerns. Relayed recommendations to MD.     Will continue to follow patient per approved [place] Nutrition Prioritization Schedule guidelines. Nutrition Services remains available via IP Nutrition Consult should patient medical status change.    Gaylin Green RD, CSG, pager [phone], [phone]  01/05/24

## 2024-01-05 NOTE — Progress Notes (Signed)
 Medicine Progress Note    Current Hospital Stay:   3 days - Admitted on: 01/02/2024    ID: Stephen [name] is a 73 year old male with a history of congenital R hydronephrosis s/p R nephrectomy, OA, HTN, MDD, gout, metastatic urothelial carcinoma (bones, lymph nodes) s/p L nephrostomy tube currently on PO erdafitinib  c/b diffuse cancer-related pain, recently diagnosed UTI (12/31) s/p nephrostomy tube exchange and cefpodoxime, and recently diagnosed LUE cellulitis (12/27/23) s/p cephalexin who presented w/ diffuse L sided pain.    Subjective / Interval Events   Interval/Subjective:  -MRI completed, plan for radiation of spine later today per rad-onc  -Pt continues to be in pain - added more IV pain meds       Scheduled Medications   allopurinol   100 mg Oral Daily 100 mg at 01/05/24 0808    busPIRone   20 mg Oral BID 20 mg at 01/05/24 9170    DULoxetine   20 mg Oral BID 20 mg at 01/05/24 9170    fluticasone  propionate  2 spray Each Naris Daily 2 spray at 01/05/24 0830    gabapentin   400 mg Oral TID 400 mg at 01/05/24 0513    heparin   5,000 Units Subcutaneous Q8H 5,000 Units at 01/05/24 0514    multivitamin with minerals  1 tablet Oral Daily 1 tablet at 01/05/24 9192    nystatin   5 mL Swish & Swallow 4x Daily 500,000 Units at 01/05/24 0514    polyethylene glycol  17 g Oral Daily 17 g at 01/03/24 0825    senna  17.2 mg Oral Daily 17.2 mg at 01/05/24 0810    sodium chloride   3 mL IntraVENOUS Q8H 3 mL at 01/05/24 0518     Continuous Medications   sodium chloride          PRN Medications   artificial tears  1 drop Both Eyes PRN    HYDROmorphone   1 mg IntraVENOUS Q4H PRN    HYDROmorphone   4 mg Oral Q4H PRN    nalOXone   0.1 mg IntraVENOUS Q2 Min PRN    prochlorperazine   10 mg Oral Q6H PRN    sodium chloride   3 mL IntraVENOUS PRN    sodium chloride    IntraVENOUS Continuous PRN    tizanidine   2 mg Oral Q8H PRN    traZODone   50 mg Oral Nightly PRN       Objective   Vitals   Latest Entry  Range (last 24 hours)    Temperature: 97.7  F (36.5 C)  Temp  Avg: 97.8 F (36.6 C)  Min: 96.5 F (35.8 C)  Max: 98.5 F (36.9 C)    Blood pressure (BP): (P) 108/56  BP  Min: 100/61  Max: 124/60    Heart Rate: 71  Pulse  Avg: 70.1  Min: 62  Max: 80    Respirations: 18  Resp  Avg: 17.3  Min: 16  Max: 20    SpO2: 92 %  SpO2  Avg: 94.1 %  Min: 92 %  Max: 97 %       No data recorded     Intake/Output (Last day):  01/16 0600 - 01/17 0559  In: 410 [P.O.:410]  Out: 500 [Urine:500]  01/04/2024 0600 - 01/05/2024 0600  Urine x0  Stool x0  Emesis x0       Weights:  Weights (last 14 days)       Date/Time Weight Weight Source Percentage Weight Change (%) Who    01/02/24 1111 73.5 kg (162  lb) Bed scale 0 % KL            Physical Exam:  General: NAD, Alert and oriented, well-developed, well-nourished  HEENT: PERRLA, EOMI, sclera anicteric, MMM, pharynx w/out erythema or exudate  Neck: Supple, trachea midline, no LAD, no thyromegaly  CV: RRR, S1/S2 normal, no r/m/g, no JVD, no carotid bruit, no pedal edema  Lungs: CTAB, no rales/rhonchi/wheezing, no increased wob, no accessory muscle use  Abdomen: Soft, NTND, no HSM, no masses, BS+, no guarding, no rebound  Extremities: DP/PT 2+, warm, well-perfused, no edema/cyanosis/clubbing  MSK: No joint erythema, tenderness, or effusions. Pain predominantly in low back and radiating down to legs.  Neuro: CN II-XII grossly intact, no focal deficits  Skin: No obvious rashes or lesions, no jaundice  Psych: Normal mood and affect, no SI/HI    Basic Labs:   BMP:  CBC:   Recent Labs     01/02/24  1215 01/03/24  0440   NA 136 138   K 4.1 3.9   CL 93* 97*   BICARB 28 30*   BUN 22 22   CREAT 1.11 1.26*   GLU 109* 117*   CA 10.2 9.6   MG  --  1.8   PHOS  --  6.5*     Ca 9.6 (01/15) Mg 1.8 (01/15) Phos 6.5* (01/15)    Recent Labs     01/02/24  1215 01/03/24  0440   WBC 7.9 5.8   HGB 9.8* 8.6*   HCT 28.8* 26.1*   PLT 192 171   SEG 75.7 67.1   LYMPHS 14.1 19.8   MONOS 9.0 10.2        Coags:  LFTs:   No results for input(s): PTT, INR, PT in  the last 72 hours.   Recent Labs     01/02/24  1215 01/03/24  0440   ALK 104 85   AST 20 21   ALT 13 10   TBILI 1.27* 1.01   DBILI 0.3* 0.3*   TP 6.6 5.7*   ALB 3.4* 3.0*        Microbiology  None    Imaging:   MRI:  IMPRESSION:  Patient motion degrades assessment, particularly of the lower thoracic/lumbar spine. Susceptibility artifact from lumbar spine hardware also degrades assessment of the lumbar spine.     Compared to the 12/02/2023 spine survey MRI and within be significant limitations, no convincing evidence disease progression (though see below).     Subtle apparent enhancement within the ventral and epidural region at L1-L2 characterized on sagittal imaging only (14/13; axial imaging is nondiagnostic); this was mentioned on the preliminary report. The ventral component is new, though strongly favored to reflect CSF artifact, given CSF artifact in this region on the sagittal T2 series. The dorsal enhancement is grossly stable compared to the prior, and is likely degenerative in etiology. (i.e., related to adjacent segment disease). Epidural tumor is felt unlikely.     Similar degree of scattered epidural/foraminal enhancement within the thoracic spine, for example on the right at T1-T2 and T2-T3; delineation between tumor and vasculature is challenging. Similar minimal enhancement along a few of the cauda equina nerve roots (noting that much of the cauda equina are poorly evaluated) is favored to be vascular or artifactual, with leptomeningeal enhancement felt less likely.     Otherwise, no convincing new or increasing intrathecal, leptomeningeal, or epidural enhancement. Please note evaluation for abnormal epidural/foraminal enhancement is particularly challenging due to artifacts and vascular/venous plexus  enhancement.     Redemonstrated are multifocal areas of abnormal signal intensity and enhancement throughout the visualized bones, likely related to osseous metastasis. This is grossly stable, though  exact comparison is challenging. No new pathologic fractures identified.     Redemonstrated are multiple postsurgical changes of the lumbar spine with stable 6 cm collection within the L3/L4 posterior surgical bed. Please note sterility of the collection is not determined by MRI. Redemonstrated degenerative findings with multilevel foraminal stenosis; see prior report for details. Redemonstrated mild grade 1 anterolisthesis of L5 on S1. Redemonstrated minimal retrolisthesis of L1 on L2.    X-Ray Femur Minimum 2 Views - Left    Result Date: 01/02/2024  IMPRESSION: No acute displaced fracture or dislocation. Redemonstration of multifocal osteosclerotic metastases of the left hemipelvis and proximal femur, better demonstrated on prior CT abdomen pelvis.    X-Ray Hip Unilateral With Pelvis 2 or 3 Views - Left    Result Date: 01/02/2024  IMPRESSION: No acute displaced fracture or dislocation. Redemonstration of multifocal osteosclerotic metastases of the imaged portion of the lower lumbar spine, pelvis, and left proximal femur, better demonstrated on prior CT from 11/28/2023. Redemonstration of lumbosacral hardware, incompletely evaluated.    X-Ray Foot Complete Minimum 3 Views - Left    Result Date: 01/02/2024  IMPRESSION: No acute displaced fracture or dislocation. Bones are demineralized but no osteolytic or osteoblastic lesions. Minimal plantar calcaneal enthesopathy. Moderate degenerative changes of the first metatarsophalangeal joint. 2nd through fifth hammertoe deformities on this nonweightbearing study. Vascular calcifications are present.    X-Ray Tibia & Fibula 2 Views - Left    Result Date: 01/02/2024  IMPRESSION: No acute displaced fracture or dislocation. No osteolytic or osteoblastic lesions. No radiographic evidence of osteonecrosis. Vascular calcifications are present.       EKG:  No components found for: QTCINTERVAL     Procedures: None.     Assessment / Plan   Assessment and Care Plan:  Shareef Eddinger  is a 73 year old male with a history of congenital R hydronephrosis s/p R nephrectomy, OA, HTN, MDD, gout, metastatic urothelial carcinoma (bones, lymph nodes) s/p L nephrostomy tube currently on PO erdafitinib  c/b diffuse cancer-related pain, recently diagnosed UTI (12/31) s/p nephrostomy tube exchange and cefpodoxime, and recently diagnosed LUE cellulitis (12/27/23) s/p cephalexin who presented w/ diffuse L sided pain suspected 2/2 cancer.      #Diffuse left sided pain  #Cancer-related pain:   Followed by Palliative Care for cancer-related pain, last seen 1/6 w/ medications adjusted slightly (stopped pregabalin  and changed gabapentin  to 300 mg TID from BID). Reported taking 4 mg dilaudid  once to twice daily at this time for pain in his L shoulder, low back, and neck. Now reporting worsening pain in his L side from the foot up to the shoulder. On exam, pt has 3/5 strength w/ L hip flexion and 4/5 strength w/ L plantar/dorsiflexion. 4+/5 on the R. Difficult to say whether this is related to pain or is true weakness. Does have known mets to spine. Last MRI spine was 12/02/23 and showed increased diffuse osseous mets within the spine, ribs, and sacrum without new pathologic fractures as well as possibly increased epidural extension within the midthoracic spine. Reports he was previously able to ambulate but couldn't get out of bed. Is scheduled to receive radiation to his back. Now s/p MRI spine survey, plan for radiation to manage pain. In speaking to radiology, favor the dorsal changes secondary to degenerative disc disease instead of  malignancy. Will reach out to rad-onc for appropriate management.  -Radiation today per rad onc  - For now, pain control:              - Continue home duloxetine  20 mg BID              - Increased gabapentin  to 400 mg TID              - Continue home tizanidine  2 mg Q8H PRN              - Dilaudid  4 mg PO Q4H PRN for moderate pain              - Dilaudid  1 mg IV Q4H PRN for severe  pain  - Scheduled tylenol   - MRI spine survey ordered  -PT ordered     #Metastatic urothelial carcinoma: Followed by Dr. Jackquline. Known mets to bones, diffuse. Had XRT to shoulder previously and is planned for XRT to his back soon - scheduled on 1/22 but after reaching out to rad onc today, can try to expedite it to sometime this week. On erdafitinib . Some talk of hospice per Palliative Care notes but patient not wanting this at this time and prefers to continue treatment.  -Will reach out to Dr. Jackquline to notify him of admission  -Hold home erdafitinib  6 mg daily for now     #S/p L nephrostomy tube placement c/b  #?Leaking  #Recurrent UTI's: Recently treated for UTI 12/31 at OSH w/ cefpodoxime but ultimately culture grew mixed flora. No e/o UTI at this time. Most recently had nephrostomy tubes replaced 12/31. Possibly some leaking from his nephrostomy site and has IR appointment on 1/16. Consulted IR - left neph tube checked without leakage.   -CTM    #Constipation: Continue home senna and miralax , increase if needed     #Elevated Tbili: Tbili 1.27 increased from 8 days ago when it was normal. No significant abdominal pain or N/V.   - Dbili added on  - Trend     #Chronic normocytic anemia: Hgb 9.8 stable/increased from prior without e/o acute blood loss. Suspect anemia of chronic disease 2/2 malignancy.  - Trend     #Gout: Continue home allopurinol  100 mg daily     #MDD: Continue home buspirone  20 mg BID     #Insomnia: Continue home trazodone  50 mg nightly     #Diet: Regular diet  #VTE prophylaxis: Heparin   #Proxy decision maker: Darin [place], [phone]  #Code status: Full code    Inpatient Checklist  Fluids: No IV fluids  Electrolytes: Replete PRN   Nutrition:       Diet    Diet Regular       Access: PIVs  VTE: not indicated due to low risk of VTE  Bowel regimen: None.  Foley: No foley catheter    Code: Full Code     Disposition Planning 01/05/2024:  Remaining discharge needs: None   Discharge location:  Home  Outpatient Follow-up Needed: None  Estimated Discharge: >72 hours      Patient was discussed with the attending [place], Michael Espos*, who agrees with the assessment and plan.    Verdon Brunette, MD  Internal Medicine Department, PGY-3  01/03/2024 9:58 AM

## 2024-01-05 NOTE — Plan of Care (Signed)
 Problem: Promotion of Health and Safety  Goal: Promotion of Health and Safety  Description: The patient remains safe, receives appropriate treatment and achieves optimal outcomes (physically, psychosocially, and spiritually) within the limitations of the disease process by discharge.    Information below is the current care plan.  Flowsheets  Taken 01/05/2024 0309  Outcome Evaluation (rationale for progressing/not progressing) every shift: VSS, pain managed w/ dilaudid . L PCN intact, not leaking, with good amount of yellowish output. MRI spine done. POC ongoing.  Taken 01/04/2024 2006  Patient /Family stated Goal: pain control  Note:

## 2024-01-05 NOTE — Interdisciplinary (Signed)
 Physical Therapy Daily Treatment Note    Admitting Physician:  Cathay Ozell Riling*  Admission Date 01/02/2024    Inpatient Diagnosis:   Problem List         Codes    Impaired mobility [Z74.09]    -  Primary ICD-10-CM: Z74.09  ICD-9-CM: 799.89    Impaired functional mobility, balance, gait, and endurance [Z74.09]     ICD-10-CM: Z74.09  ICD-9-CM: V49.89            IP Start of Service   Start of Care: 01/05/24  Onset Date: 01/01/2023  Reason for referral: Activity tolerance limitation;Decline in functional ability/mobility;Range of motion/strength limitations    Preferred Language:English         Past Medical History:   Diagnosis Date    Chronic back pain     Congenital hydronephrosis     Gout     Headache     Hematuria     HTN (hypertension) 12/10/2021    Kidney disease     Kidney stones     Major depressive disorder, single episode     Polyarthropathy or polyarthritis of multiple sites     Retinal detachment     Urethral stricture       Past Surgical History:   Procedure Laterality Date    CT INSERTION OF SUPRAPUBIC CATH  09/25/2015    NEPHRECTOMY Right 1955    APPENDECTOMY      COLONOSCOPY      CYSTOSCOPY      CYSTOSCOPY W/ LASER LITHOTRIPSY      OTHER SURGICAL HISTORY      Interstim 01/29/2011    SPINE SURGERY  09/21    Lumbar-sacral fusion    TRANSURETHRAL RESECTION OF PROSTATE          PT Acute       Row Name 01/05/24 1200          Type of Visit    Type of Physical Therapy note Physical Therapy Daily Treatment Note       Row Name 01/05/24 1200          Treatment Precautions/Restrictions    Precautions/Restrictions Fall     Fall Bed/chair alarm;Socks/charm     Other Precautions/Restrictions Information L nephrostomy       Row Name 01/05/24 1200          Medical History    History of presenting condition Pt is a 73 year old male with a history of congenital R hydronephrosis s/p R nephrectomy, OA, HTN, MDD, gout, metastatic urothelial carcinoma (bones, lymph nodes) s/p L nephrostomy tube currently on PO  erdafitinib  c/b diffuse cancer-related pain, recently diagnosed UTI (12/31) s/p nephrostomy tube exchange and cefpodoxime, and recently diagnosed LUE cellulitis (12/27/23) s/p cephalexin who presented w/ diffuse L sided pain.     Other Past Medical History Information Does have known mets to spine. Last MRI spine was 12/02/23 and showed increased diffuse osseous mets within the spine, ribs, and sacrum without new pathologic fractures as well as possibly increased epidural extension within the midthoracic spine. X-rays from 1/14 confirm no acute fracture       Row Name 01/05/24 1200          Functional History    Prior Level of Function Minimal deficits     Equipment required for mobility in the home Walker;Cane     Other Functional History Information Pt ambulates with SPC for household distances, 4WW for community access       Row Name  01/05/24 1200          Social History    Living Situation Lives with parent/family     Home Environment House     Home accessibility  Accessible with wheelchair or walker     Other Social History Information Pt lives with sister and niece, sister can assist as needed       Row Name 01/05/24 1200          Subjective    Subjective Information Pt received in sitting and agreeable to treat. Pt reports, I still have a lot of pain, but it feels a little better.     Patient status Patient agreeable to treatment;Nursing in agreement for treatment       Row Name 01/05/24 1200          Pain Assessment    Pain Asssessment Tool Numeric Pain Rating Scale       Row Name 01/05/24 1200          Numeric Pain Rating Scale    Pain Intensity - rating at present 8     Pain Intensity- rating after treatment 8     Location L lateral upper leg and low back       Row Name 01/05/24 1200          Objective    Overall Cognitive Status Impaired     Other  Cognitive Status Information Pt AOx4 to person, place, time, and situation, but noted delayed processing and confusion;     Communication Speech Imparired      Other  Communication Information Dysarthria     Coordination/Motor control No limitations or impairments noted. Movement patterns are fluid and coordinated throughout     Balance Balance limitations present     Static Sitting Balance Good - able to maintain balance without handhold support, limited postural sway     Dynamic Sitting Balance Fair - accepts minimal challenge, able to maintain balance while turning head/trunk     Static Standing Balance Fair - able to maintain balance with handhold support, may require occasional minimal assistance     Dynamic Standing Balance Fair - accepts minimal challenge, able to maintain balance while turning head/trunk     Other Balance Information Fair static dynamic standing balance with FWW for steadying, no LOB noted, min sway, pt tolerates bouts of standing for about 5 mins.     Extremity Assessment Range of motion, strength,  muscle tone and/or sensation limitations present     Functional Mobility Functional mobility deficits present     Bed Mobility Other (comments)     Bed Mobility Comments Not observed, pt received in sitting     Transfers to/from Stand Moderate assistance (25-50% assistance)     Transfer Comments STS and stand step pivot with FWW, Mod A, cues for task sequencing including AD management and proper heel-toe pivoting sequence. Pt requires max multimodal cues to increase carryover.     Gait Moderate assistance (25-50% assistance)     Gait Comments Instructed the pt in gait training across 4x 2-3 feet sidestepping, Mod A, FWW, with cues for lateral weight shifting, increased WB through B UE to offload the L LE and heel-toe pivot needed for limb advancement 2/2 poor tolerance to single leg stance. Attempted forward stepping, but pt unable to tolerate.     Device used for [employer] 4x 2-3 feet     Other Objective Findings RN cleared the pt,  Pt received in sitting and agreeable to treat. Instructed pt in  the above MRADLs as described; in addition instructed the pt in seated therex including LAQ, APs; Pt returned to sitting at end of treat. Pt left to comfort. All needs met, call light within reach, fall precautions in place. RN notified of findings/therapy recs.                          Eval cont.       Row Name 01/05/24 1200          Patient/Family Education    Learner(s) Patient     Learner response to rehab patient education interventions Verbalizes understanding;Needs reinforcement     Patient/family training comments PT role, PT POC, benefits/importance of OOB activity, HEP, safety recommendations,       Row Name 01/05/24 1200          Assessment    Assessment Pt tolerates treatment well including gait training across 4x 2-3 feet sidestepping, FWW, Mod A; barriers include pain; Plan for next PT session: Continue to address gait deficits. Pt presents below PLOF and will benefit from ongoing skilled PT services to maximize independence with MRADLs. RN notified/aware of therapy recommendations.     Hospital mobility recommendations for nursing/staff: OOB to chair for meals with one person assist.    POST ACUTE REHABILITATION DISCHARGE RECOMMENDATIONS:    Recommended Post-Acute Therapy Follow up:   Ongoing Skilled Therapy after Discharge    If available, recommend discharge to a setting with the following assistance for the listed deficits:     Deficits :   Transfers: assist level: Moderate (25-50% assistance)  Gait: assist level: Moderate (25-50% assistance)      Other Post Acute Discharge Recommendations : Recommend 24/7 caregiver assist.    Equipment recommendations:   Wheelchair: Patient is able to complete Mobility Related Activities of Daily Living (MRADLs) within the home in a more timely manner when using a standard wheelchair          Rehab Potential Good       Row Name 01/05/24 1200          Planned Therapy Interventions and Rationale    Gait Training to normalize gait pattern and improve safety while  ambulating with assistive device     Neuromuscular Re-Education to improve safety during dynamic activities;to improve kinesthetic awareness and postural control     Therapeutic Activities to improve functional mobility and ability to navigate in the home and/or community;to improve transfers between surfaces     Theraputic Exercise to improve activity tolerance to allow greater independence with functional mobility skills;to increase strength to allow greater independence with functional mobility skills       Row Name 01/05/24 1200          Treatment Plan Disussion    Treatment Plan Discussion and Agreement Patient/family/caregiver stated understanding and agreement with the therapy plan       Row Name 01/05/24 1200          Treatment Plan    Continue therapy to address Decline in functional ability/mobility     Frequency of treatment 3 times per week     Duration of treatment (number of visits) While patient is hospitalized and in need of skilled therapy services     Status of treatment Patient evaluated and will benefit from ongoing skilled therapy       Row Name 01/05/24 1200  Patient Safety Considerations     Patient safety considerations Patient returned to bed at end of treatment;Call light left in reach and fall precautions in place     Patient assistive device requirements for safe ambulation Wheelchair       Row Name 01/05/24 1200          Therapy Plan Communication    Therapy Plan Communication Discussed therapy plan with Nursing and/or Physician;Encouraged out of bed with assistance by     Encouraged out of bed with assistance by Nursing;Staff       Row Name 01/05/24 1200          Physical Therapy Patient Discharge Instructions    Your Physical Therapist suggests the following Continue to complete your home exercise program daily as instructed;Continue to follow your prescribed mobility precautions when moving in and out of bed and walking  as instructed;Continue to use correct body mechanics  when moving in and out of bed as instructed;Supervision with walking is suggested for increased safety;Continue to use your assistive device as instructed when walking to improve your stability and prevent falls       Row Name 01/05/24 1200          Therapeutic Procedures    Gait Training (02883) Assistive device training;Patient education;Gait pattern analysis and treatment of deviations        Total TIMED Treatment (min)  15     Therapeutic Activities (97530)  Assistance/facilitation of bed mobility;Transfer training with weight shift and direction change;Weight shift activities to improve safety in unsupported sitting or standing;Patient education;Progressive mobilization to improve functional independence        Total TIMED Treatment (min)  15     Therapeutic exercise  (97110)  Dynamic exercises;Strengthening exercises        Total TIMED Treatment (min)  15       Row Name 01/05/24 1200          Treatment Time     Total TIMED Treatment  (min) 45     Total Treatment Time (min) 45     Treatment start time 1120                        The physical therapist of record is endorsed by evaluating physical therapist.

## 2024-01-05 NOTE — Plan of Care (Addendum)
 Problem: Promotion of Health and Safety  Goal: Promotion of Health and Safety  Description: The patient remains safe, receives appropriate treatment and achieves optimal outcomes (physically, psychosocially, and spiritually) within the limitations of the disease process by discharge.    Information below is the current care plan.  Flowsheets (Taken 01/05/2024 2240)  Patient /Family stated Goal: Get well  Note:  CC:  Left flank pain, lower back pain. Hx urothelial ca met to bones, lymph nodes. S/p left nephrostomy tube. LUE celulitis.  A/o x 2 self, and where.   Plan  High fall risk- rails are up\  Pain control

## 2024-01-05 NOTE — Progress Notes (Signed)
 [facility], Radiation Oncology   Weekly Management Eval  OTV    Patient: Stephen [name]      DOB: [birth year]     MRN: [id number]     Date of Evaluation: 01/05/2024    Physician: Lamarr Slovak, MD.  Referring Provider: NORMAN RATTLER     Radiation:  C7-T4 Spine - Planned Dose 800cGy  L1-L4 Spine - Planned Dose 800cGy  3 SPINE (Plan ID - 1 C7-T4 Spine) - Rx Dose 800cGy (Status - Treatment   Approved) - 0 / 1  3 SPINE (Plan ID - 2 L1-L4 Spine) - Rx Dose 800cGy (Status - Treatment   Approved) - 0 / 1     Vitals:      Fatigue Score: 2    Performance:     Steroids: None    Ppx: N/a    Nurses Note:   Not seen by nursing.    Chaperone Documentation:   Chaperone present: - No chaperone present: Not   required for the exam.    MD Note: Patient currently hospitalized for worsening lumbar pain,   currently 8/10 in lower back. States IV medications are helping with his   pain. Denies nausea, loose stools, sore throat, focal weakness. Tolerated   treatment well today. Counseled on possible side effects to expect after   RT.     Physical Exam:      General: Frail, chronically ill appearing      Skin: Unchanged     Neuro: 4/5 in BLE and BUE. Sensation to light touch intact.      Treatment Area: Unchanged    Films:  Checked and approved for this week.    Plan: I have reviewed the treatment plan. Single fx treatment today. F/u   prn with Dr. Antonetta, can re-evaluate for additional sites of treatment if   needed.     MEDICAL STUDENT, RESIDENT, and ATTENDING: Alphonza Bumps, MD - PID [id number] was   physically present with the MS during the performance of the history,   examination, and medical decision making. I personally examined the patient   and discussed the case with Alphonza Bumps, MD - PID [id number]. I reviewed and   agree with the findings and plan as document by the Alphonza Bumps, MD - PID   [id number]. My additions and revisions are included in the record.      Signature derived from controlled access password  Lamarr Slovak, MD - PID [id number]  01/05/2024 6:40:48 PM

## 2024-01-06 LAB — COMPREHENSIVE METABOLIC PANEL, BLOOD
ALT (SGPT): 13 U/L (ref 0–41)
AST (SGOT): 27 U/L (ref 0–40)
Albumin: 3.1 g/dL — ABNORMAL LOW (ref 3.5–5.2)
Alkaline Phos: 102 U/L (ref 40–129)
Anion Gap: 12 mmol/L (ref 7–15)
BUN: 33 mg/dL — ABNORMAL HIGH (ref 8–23)
Bicarbonate: 29 mmol/L (ref 22–29)
Bilirubin, Tot: 0.8 mg/dL (ref <1.2)
Calcium: 9.5 mg/dL (ref 8.5–10.6)
Chloride: 94 mmol/L — ABNORMAL LOW (ref 98–107)
Creatinine: 1.27 mg/dL — ABNORMAL HIGH (ref 0.67–1.17)
Glucose: 129 mg/dL — ABNORMAL HIGH (ref 70–99)
Potassium: 3.9 mmol/L (ref 3.5–5.1)
Sodium: 135 mmol/L — ABNORMAL LOW (ref 136–145)
Total Protein: 6.4 g/dL (ref 6.0–8.0)
eGFR Based on CKD-EPI 2021 Equation: >60 mL/min/{1.73_m2}

## 2024-01-06 LAB — CBC WITH DIFF, BLOOD
ANC-Automated: 4.8 10*3/uL (ref 1.6–7.0)
Abs Basophils: 0 10*3/uL (ref <0.2)
Abs Eosinophils: 0.1 10*3/uL (ref 0.0–0.5)
Abs Lymphs: 0.9 10*3/uL (ref 0.8–3.1)
Abs Monos: 0.4 10*3/uL (ref 0.2–0.8)
Basophils: 0.3 %
Eosinophils: 1.4 %
Hct: 26.8 % — ABNORMAL LOW (ref 40.0–50.0)
Hgb: 9.1 g/dL — ABNORMAL LOW (ref 13.7–17.5)
Imm Gran %: 0.3 % (ref <1)
Imm Gran Abs: 0 10*3/uL (ref <0.1)
Lymphocytes: 15 %
MCH: 31 pg (ref 26.0–32.0)
MCHC: 34 g/dL (ref 32.0–36.0)
MCV: 91.2 um3 (ref 79.0–95.0)
MPV: 9.3 fL — ABNORMAL LOW (ref 9.4–12.4)
Monocytes: 6.2 %
Plt Count: 235 10*3/uL (ref 140–370)
RBC: 2.94 10*6/uL — ABNORMAL LOW (ref 4.60–6.10)
RDW: 13.4 % (ref 12.0–14.0)
Segs: 76.8 %
WBC: 6.3 10*3/uL (ref 4.0–10.0)

## 2024-01-06 MED ORDER — HYDROMORPHONE HCL 1 MG/ML IJ SOLN
1.0000 mg | INTRAMUSCULAR | Status: AC | PRN
Start: 2024-01-06 — End: 2024-01-07

## 2024-01-06 NOTE — Plan of Care (Signed)
 Problem: Promotion of Health and Safety  Goal: Promotion of Health and Safety  Description: The patient remains safe, receives appropriate treatment and achieves optimal outcomes (physically, psychosocially, and spiritually) within the limitations of the disease process by discharge.    Information below is the current care plan.  Outcome: Progressing  Flowsheets  Taken 01/06/2024 1815  Outcome Evaluation (rationale for progressing/not progressing) every shift: AO x 3-4, intermittently forgetful and perseverating on going home this morning. Wheeled patient in wheelchair through hallways to encourage delirium prevention. Requests PRN Dilaudid  for back pain, asleep at re-assessment. Moderate appetite. Denies nausea. Weak and unsteady on feet, requires reminders to call for assistance to use commode. PCN draining clear yellow urine, no leaking. Prophylactic Mepilex in place to coccyx. Moisture associated breakdown on right buttock, patient states he had been previously having diarrhea and wiping himself frequently. Assisted to turn side to side with pillow support while resting on stretcher, otherwise patient sits EOB. No BM today. Plan to transfer to [address] shortly.  Taken 01/06/2024 0800  Patient /Family stated Goal: wants to go home  Note:

## 2024-01-06 NOTE — Progress Notes (Signed)
 Medicine Progress Note    Current Hospital Stay:   4 days - Admitted on: 01/02/2024    ID: Stephen [name] is a 73 year old male with a history of congenital R hydronephrosis s/p R nephrectomy, OA, HTN, MDD, gout, metastatic urothelial carcinoma (bones, lymph nodes) s/p L nephrostomy tube currently on PO erdafitinib  c/b diffuse cancer-related pain, recently diagnosed UTI (12/31) s/p nephrostomy tube exchange and cefpodoxime, and recently diagnosed LUE cellulitis (12/27/23) s/p cephalexin who presented w/ diffuse L sided pain.    Subjective / Interval Events   Interval/Subjective:  - S/p spinal radiation, pt does not articulate whether this was helpful yet or not  - Perseverates about discharge w/ bedside RN, understood he has uncontrolled pain that should be addressed  - Did not answer directly whether or not he's having much pain  - Wants to go outside to be in the sun      Scheduled Medications   allopurinol   100 mg Oral Daily 100 mg at 01/05/24 0808    busPIRone   20 mg Oral BID 20 mg at 01/05/24 2118    DULoxetine   20 mg Oral BID 20 mg at 01/05/24 2118    fluticasone  propionate  2 spray Each Naris Daily 2 spray at 01/05/24 0830    gabapentin   400 mg Oral TID 400 mg at 01/06/24 9356    heparin   5,000 Units Subcutaneous Q8H 5,000 Units at 01/06/24 9356    multivitamin with minerals  1 tablet Oral Daily 1 tablet at 01/05/24 9192    nystatin   5 mL Swish & Swallow 4x Daily 500,000 Units at 01/06/24 9356    polyethylene glycol  17 g Oral Daily 17 g at 01/03/24 0825    senna  17.2 mg Oral Daily 17.2 mg at 01/05/24 0810    sodium chloride   3 mL IntraVENOUS Q8H 3 mL at 01/06/24 0644     Continuous Medications   sodium chloride          PRN Medications   artificial tears  1 drop Both Eyes PRN    HYDROmorphone   1 mg IntraVENOUS Q4H PRN    HYDROmorphone   4 mg Oral Q4H PRN    nalOXone   0.1 mg IntraVENOUS Q2 Min PRN    prochlorperazine   10 mg Oral Q6H PRN    sodium chloride   3 mL IntraVENOUS PRN    sodium chloride    IntraVENOUS  Continuous PRN    tizanidine   2 mg Oral Q8H PRN    traZODone   50 mg Oral Nightly PRN       Objective   Vitals   Latest Entry  Range (last 24 hours)    Temperature: 97.7 F (36.5 C)  Temp  Avg: 98.2 F (36.8 C)  Min: 97.6 F (36.4 C)  Max: 99.1 F (37.3 C)    Blood pressure (BP): 110/52  BP  Min: 85/56  Max: 110/52    Heart Rate: 79  Pulse  Avg: 69.9  Min: 57  Max: 80    Respirations: 17  Resp  Avg: 17.3  Min: 16  Max: 18    SpO2: 99 %  SpO2  Avg: 95.7 %  Min: 92 %  Max: 99 %       No data recorded     Intake/Output (Last day):  01/17 0600 - 01/18 0559  In: 240 [P.O.:240]  Out: 1350 [Urine:1350]  01/05/2024 0600 - 01/06/2024 0600  Urine x0  Stool x2  Emesis x0  Weights:  Weights (last 14 days)       Date/Time Weight Weight Source Percentage Weight Change (%) Who    01/02/24 1111 73.5 kg (162 lb) Bed scale 0 % KL            Physical Exam:  General: NAD, Alert and oriented, cachectic elderly male sitting in bed  HEENT: sclera anicteric  CV: RRR, S1/S2 normal, no r/m/g, no JVD, no carotid bruit, no pedal edema  Lungs: CTAB, no rales/rhonchi/wheezing, no increased wob, no accessory muscle use  Abdomen: Soft, NTND, no HSM, no masses, BS+, no guarding, no rebound  Extremities: DP/PT 2+, warm, well-perfused, no edema/cyanosis/clubbing  MSK: No joint erythema, tenderness, or effusions. Pain predominantly in low back and radiating down to legs, not reproducible this AM  Neuro: no focal deficits  Skin: No obvious rashes or lesions, no jaundice  Psych: Normal mood and affect, no SI/HI    Basic Labs:   BMP:  CBC:   Recent Labs     01/05/24  0904 01/06/24  0306   NA 134* 135*   K 4.1 3.9   CL 94* 94*   BICARB 29 29   BUN 26* 33*   CREAT 1.22* 1.27*   GLU 138* 129*   CA 9.3 9.5     Ca 9.5 (01/18) Mg   Phos      Recent Labs     01/05/24  0904 01/06/24  0306   WBC 8.9 6.3   HGB 9.0* 9.1*   HCT 27.1* 26.8*   PLT 237 235   SEG 79.4 76.8   LYMPHS 13.6 15.0   MONOS 5.8 6.2        Coags:  LFTs:   No results for input(s): PTT,  INR, PT in the last 72 hours.   Recent Labs     01/05/24  0904 01/06/24  0306   ALK 100 102   AST 27 27   ALT 14 13   TBILI 0.80 0.80   TP 6.3 6.4   ALB 3.0* 3.1*        Microbiology  None    Imaging:   MRI spine survey 1/17  IMPRESSION:  Patient motion degrades assessment, particularly of the lower thoracic/lumbar spine. Susceptibility artifact from lumbar spine hardware also degrades assessment of the lumbar spine.     Compared to the 12/02/2023 spine survey MRI and within be significant limitations, no convincing evidence disease progression (though see below).     Subtle apparent enhancement within the ventral and epidural region at L1-L2 characterized on sagittal imaging only (14/13; axial imaging is nondiagnostic); this was mentioned on the preliminary report. The ventral component is new, though strongly favored to reflect CSF artifact, given CSF artifact in this region on the sagittal T2 series. The dorsal enhancement is grossly stable compared to the prior, and is likely degenerative in etiology. (i.e., related to adjacent segment disease). Epidural tumor is felt unlikely.     Similar degree of scattered epidural/foraminal enhancement within the thoracic spine, for example on the right at T1-T2 and T2-T3; delineation between tumor and vasculature is challenging. Similar minimal enhancement along a few of the cauda equina nerve roots (noting that much of the cauda equina are poorly evaluated) is favored to be vascular or artifactual, with leptomeningeal enhancement felt less likely.     Otherwise, no convincing new or increasing intrathecal, leptomeningeal, or epidural enhancement. Please note evaluation for abnormal epidural/foraminal enhancement is particularly challenging due to artifacts and vascular/venous plexus  enhancement.     Redemonstrated are multifocal areas of abnormal signal intensity and enhancement throughout the visualized bones, likely related to osseous metastasis. This is grossly  stable, though exact comparison is challenging. No new pathologic fractures identified.     Redemonstrated are multiple postsurgical changes of the lumbar spine with stable 6 cm collection within the L3/L4 posterior surgical bed. Please note sterility of the collection is not determined by MRI. Redemonstrated degenerative findings with multilevel foraminal stenosis; see prior report for details. Redemonstrated mild grade 1 anterolisthesis of L5 on S1. Redemonstrated minimal retrolisthesis of L1 on L2.       EKG:  No components found for: QTCINTERVAL     Procedures: None.     Assessment / Plan   Assessment and Care Plan:  Stephen [name] is a 72 year old male with a history of congenital R hydronephrosis s/p R nephrectomy, OA, HTN, MDD, gout, metastatic urothelial carcinoma (bones, lymph nodes) s/p L nephrostomy tube currently on PO erdafitinib  c/b diffuse cancer-related pain, recently diagnosed UTI (12/31) s/p nephrostomy tube exchange and cefpodoxime, and recently diagnosed LUE cellulitis (12/27/23) s/p cephalexin who presented w/ diffuse L sided pain suspected 2/2 cancer.      # Diffuse left sided pain  # Cancer-related pain  Followed by Palliative Care for cancer-related pain, last seen 1/6 w/ medications adjusted slightly (stopped pregabalin  and changed gabapentin  to 300 mg TID from BID). Reported taking 4 mg dilaudid  once to twice daily at this time for pain in his L shoulder, low back, and neck. Now reporting worsening pain in his L side from the foot up to the shoulder. On exam, pt has 3/5 strength w/ L hip flexion and 4/5 strength w/ L plantar/dorsiflexion. 4+/5 on the R. Difficult to say whether this is related to pain or is true weakness. Does have known mets to spine. Last MRI spine was 12/02/23 and showed increased diffuse osseous mets within the spine, ribs, and sacrum without new pathologic fractures as well as possibly increased epidural extension within the midthoracic spine. Reports he was  previously able to ambulate but couldn't get out of bed. Now s/p MRI spine survey, and received radiation 1/17. Plan to continue to monitor over the next couple of days if this helps  - For now, pain control:              - Continue home duloxetine  20 mg BID              - Increased gabapentin  to 400 mg TID              - Continue home tizanidine  2 mg Q8H PRN              - Dilaudid  4 mg PO Q4H PRN for moderate pain              - Dilaudid  1 mg IV Q4H PRN for severe pain  - Scheduled tylenol   - PT ordered  - OOB as able  - If no further improvement of pain control by 1/20, will need to discuss with primary oncologist as well as palliative care MD     # Metastatic urothelial carcinoma: Followed by Dr. Jackquline. Known mets to bones, diffuse. Had XRT to shoulder previously and is planned for XRT to his back soon - scheduled on 1/22 but after reaching out to rad onc today, can try to expedite it to sometime this week. On erdafitinib . Some talk of hospice per Palliative Care  notes but patient not wanting this at this time and prefers to continue treatment.  - Hold home erdafitinib  6 mg daily for now     # S/p L nephrostomy tube placement c/b  # ?Leaking  # Recurrent UTI's: Recently treated for UTI 12/31 at OSH w/ cefpodoxime but ultimately culture grew mixed flora. No e/o UTI at this time. Most recently had nephrostomy tubes replaced 12/31. Possibly some leaking from his nephrostomy site and has IR appointment on 1/16. Consulted IR - left neph tube checked without leakage.   -CTM    # Constipation: Continue home senna and miralax , increase if needed     # Elevated Tbili: Tbili 1.27 increased from 8 days ago when it was normal. No significant abdominal pain or N/V.   - Dbili added on  - Trend     # Chronic normocytic anemia: Hgb 9.8 stable/increased from prior without e/o acute blood loss. Suspect anemia of chronic disease 2/2 malignancy.  - Trend     # Gout: Continue home allopurinol  100 mg daily     # MDD: Continue home  buspirone  20 mg BID     # Insomnia: Continue home trazodone  50 mg nightly     #Diet: Regular diet  #VTE prophylaxis: Heparin   #Proxy decision maker: Darin [place], [phone]  #Code status: Full code    Inpatient Checklist  Fluids: No IV fluids  Electrolytes: Replete PRN   Nutrition:       Diet    Diet Regular       Access: PIVs  VTE: not indicated due to low risk of VTE  Bowel regimen: None.  Foley: No foley catheter    Code: Full Code     Disposition Planning 01/06/2024:  Remaining discharge needs: None   Discharge location: Skilled nursing facility  Outpatient Follow-up Needed: None  Estimated Discharge: >72 hours      Patient was discussed with the attending Cathay Ozell Riling*, who agrees with the assessment and plan.    Tinnie LITTIE Amber, MD  Resident Physician (PGY-3)  Internal Medicine  [facility]

## 2024-01-07 LAB — COMPREHENSIVE METABOLIC PANEL, BLOOD
ALT (SGPT): 12 U/L (ref 0–41)
AST (SGOT): 25 U/L (ref 0–40)
Albumin: 3.2 g/dL — ABNORMAL LOW (ref 3.5–5.2)
Alkaline Phos: 107 U/L (ref 40–129)
Anion Gap: 13 mmol/L (ref 7–15)
BUN: 31 mg/dL — ABNORMAL HIGH (ref 8–23)
Bicarbonate: 28 mmol/L (ref 22–29)
Bilirubin, Tot: 0.71 mg/dL (ref <1.2)
Calcium: 9.7 mg/dL (ref 8.5–10.6)
Chloride: 99 mmol/L (ref 98–107)
Creatinine: 1.24 mg/dL — ABNORMAL HIGH (ref 0.67–1.17)
Glucose: 106 mg/dL — ABNORMAL HIGH (ref 70–99)
Potassium: 4 mmol/L (ref 3.5–5.1)
Sodium: 140 mmol/L (ref 136–145)
Total Protein: 6.5 g/dL (ref 6.0–8.0)
eGFR Based on CKD-EPI 2021 Equation: >60 mL/min/{1.73_m2}

## 2024-01-07 LAB — CBC WITH DIFF, BLOOD
ANC-Automated: 2.5 10*3/uL (ref 1.6–7.0)
Abs Basophils: 0 10*3/uL (ref <0.2)
Abs Eosinophils: 0.2 10*3/uL (ref 0.0–0.5)
Abs Lymphs: 1 10*3/uL (ref 0.8–3.1)
Abs Monos: 0.4 10*3/uL (ref 0.2–0.8)
Basophils: 0.5 %
Eosinophils: 5.9 %
Hct: 28.2 % — ABNORMAL LOW (ref 40.0–50.0)
Hgb: 9.5 g/dL — ABNORMAL LOW (ref 13.7–17.5)
Imm Gran %: 0.2 % (ref <1)
Imm Gran Abs: 0 10*3/uL (ref <0.1)
Lymphocytes: 24.4 %
MCH: 31.3 pg (ref 26.0–32.0)
MCHC: 33.7 g/dL (ref 32.0–36.0)
MCV: 92.8 um3 (ref 79.0–95.0)
MPV: 9.5 fL (ref 9.4–12.4)
Monocytes: 9 %
Plt Count: 247 10*3/uL (ref 140–370)
RBC: 3.04 10*6/uL — ABNORMAL LOW (ref 4.60–6.10)
RDW: 13.6 % (ref 12.0–14.0)
Segs: 60 %
WBC: 4.1 10*3/uL (ref 4.0–10.0)

## 2024-01-07 MED ORDER — MUCOSITIS MIXTURE FORMULA #3 MT SOLN (COMPOUNDED)
10.0000 mL | Freq: Four times a day (QID) | Status: DC
Start: 2024-01-07 — End: 2024-01-10
  Administered 2024-01-07 – 2024-01-10 (×13): 10 mL via OROMUCOSAL
  Filled 2024-01-07: qty 240

## 2024-01-07 MED ORDER — ACETAMINOPHEN 325 MG PO TABS
975.0000 mg | ORAL_TABLET | Freq: Three times a day (TID) | ORAL | Status: DC
Start: 2024-01-07 — End: 2024-01-10
  Administered 2024-01-07 – 2024-01-10 (×10): 975 mg via ORAL
  Filled 2024-01-07 (×10): qty 3

## 2024-01-07 NOTE — Plan of Care (Signed)
Problem: Promotion of Health and Safety  Goal: Promotion of Health and Safety  Description: The patient remains safe, receives appropriate treatment and achieves optimal outcomes (physically, psychosocially, and spiritually) within the limitations of the disease process by discharge.    Information below is the current care plan.  Outcome: Progressing  Flowsheets  Taken 01/07/2024 1743 by Jacqulyn Cane, RN  Outcome Evaluation (rationale for progressing/not progressing) every shift: VSS 3-4 with intermittent confusion and forgetfullness, pain controlled with current pain regimen, ambulated in the hallway with walker and SBA, maintained safety, POC ongoing  Taken 01/07/2024 0740 by Jacqulyn Cane, RN  Patient /Family stated Goal: feel better  Taken 01/07/2024 0416 by Peter Congo, RN  Guidelines: Inpatient Nursing Guidelines  Individualized Interventions/Recommendations #1: Oriented to room, staff and unit routines.  Individualized Interventions/Recommendations #2 (if applicable): Monitored for pain and discomfort.  Individualized Interventions/Recommendations #3 (if applicable): Kept L nephrostomy in place.  Individualized Interventions/Recommendations #4 (if applicable): Clustered care to promote rest and sleep.  Note:

## 2024-01-07 NOTE — Plan of Care (Signed)
Problem: Promotion of Health and Safety  Goal: Promotion of Health and Safety  Description: The patient remains safe, receives appropriate treatment and achieves optimal outcomes (physically, psychosocially, and spiritually) within the limitations of the disease process by discharge.    Information below is the current care plan.  Outcome: Progressing  Flowsheets  Taken 01/07/2024 0416  Guidelines: Inpatient Nursing Guidelines  Individualized Interventions/Recommendations #1: Oriented to room, staff and unit routines.  Individualized Interventions/Recommendations #2 (if applicable): Monitored for pain and discomfort.  Individualized Interventions/Recommendations #3 (if applicable): Kept L nephrostomy in place.  Individualized Interventions/Recommendations #4 (if applicable): Clustered care to promote rest and sleep.  Outcome Evaluation (rationale for progressing/not progressing) every shift: AOx3-4 with intermittent confusion and forgetfulness, VSS and afebrile. Given pain medication as ordered for pain control. L nephrostomy tube in place. Patient very unsteady provided non skid socks and walker. Bed alarm on and call light withn reach. Seen sleeping well and comfortably.  Taken 01/06/2024 2000  Patient /Family stated Goal: rest and sleep  Note:

## 2024-01-07 NOTE — Progress Notes (Addendum)
 HOSPITAL MEDICINE ATTENDING  DAILY PROGRESS NOTE      Subjective/Interval History:    Some improved pain in lower legs and feet, less radiculopathy  Still with pain in buttocks on a skin tear      12 systems reviewed as per my usual practice with no additional information identified.     Objective   Vital Signs:   Temperature:  [97.5 F (36.4 C)-98.2 F (36.8 C)] 98.2 F (36.8 C) (01/19 1122)  Blood pressure (BP): (92-120)/(48-58) 105/50 (01/19 1122)  Heart Rate:  [48-68] 68 (01/19 1122)  Respirations:  [16-18] 16 (01/19 1122)  Pain Score: 3 (01/19 1000)  O2 Device: None (Room air) (01/18 1741)  SpO2:  [95 %-100 %] 95 % (01/19 1122)    Wt Readings from Last 3 Encounters:   01/07/24 65.8 kg (145 lb 1 oz)   12/25/23 70.9 kg (156 lb 6.4 oz)   12/04/23 74.4 kg (164 lb)       01/18 0600 - 01/19 0559  In: 0   Out: 2175 [Urine:2175]  01/06/2024 0600 - 01/07/2024 0600  Urine x0  Stool x0  Emesis x0       Physical Exam:  General Appearance: frail, alert, no distress, pleasant affect, cooperative.  Neck:  Neck supple. No adenopathy, thyroid  symmetric, normal size.  Heart:  normal rate and regular rhythm, no murmurs, clicks, or gallops.  Lungs: clear to auscultation and percussion, no chest deformities noted.  Abdomen: Abdomen soft, non-tender. No masses or organomegaly. Bowel sounds normal.  Extremities:  no cyanosis, clubbing, or edema.  Skin:  sacrum 2 cm skin tear inferior to gluteal cleft on left.  Mental Status: Appearance/Cooperation: in no apparent distress, non-toxic, in no respiratory distress and acyanotic, alert, and oriented times 3      Allergies   Allergen Reactions    Sulfa Drugs Unspecified, Hives and Other       Scheduled Inpatient Medications   allopurinol   100 mg Daily    busPIRone   20 mg BID    DULoxetine   20 mg BID    fluticasone  propionate  2 spray Daily    gabapentin   400 mg TID    heparin   5,000 Units Q8H    maalox-lidocaine  viscous-diphenhydrAMINE  1:1:1 ratio  10 mL 4x Daily AC & HS    multivitamin  with minerals  1 tablet Daily    polyethylene glycol  17 g Daily    senna  17.2 mg Daily    sodium chloride   3 mL Q8H       PRN Inpatient Medications   artificial tears  1 drop PRN    HYDROmorphone   1 mg Q4H PRN    HYDROmorphone   4 mg Q4H PRN    nalOXone   0.1 mg Q2 Min PRN    prochlorperazine   10 mg Q6H PRN    sodium chloride   3 mL PRN    sodium chloride    Continuous PRN    tizanidine   2 mg Q8H PRN    traZODone   50 mg Nightly PRN       VTE prophylaxis/anticoagulation:    heparin   5,000 Units Subcutaneous Q8H   5,000 Units at 01/07/24 0506     Stool: Last BM: 01/07/24 1030   maalox-lidocaine  viscous-diphenhydrAMINE  1:1:1 ratio  10 mL 4x Daily AC & HS    polyethylene glycol  17 g Daily    senna  17.2 mg Daily      Analgesia:  HYDROmorphone   1 mg Q4H PRN  HYDROmorphone   4 mg Q4H PRN      Feeding: Diet Regular  Nutritional Supplement - Oral Novasource Renal; Strawberry ; Deliver Supplements: BID   Glucose:   No results for input(s): GLUCPOCT in the last 72 hours.   Oxygen RA   Ambulation: Per PT   Lines/Foley: Patient Lines/Drains/Airways Status       Active PICC Line / CVC Line / PIV Line / Drain / Airway / Intraosseous Line / Epidural Line / ART Line / Line Type / Wound / Pressure Ulcer Injury       Name Placement date Placement time Site Days    Port A Cath Single Lumen  - 02/13/23 Right Internal jugular 02/13/23  0908  Internal jugular  328    Nephrostomy -  Left 03/21/23  1610  Left  291    Nephrostomy -  Left 04/27/23  1900  Left  254    Nephrostomy -  Left 11/29/23  1151  Left  39    Stent -  Ureteral (left) 12/24/21  1102  Ureteral (left)  744    Impaired Skin Integrity -  Skin tear Buttock(s) Right 01/06/24  1648  -- less than 1                     Laboratory data:   WBC 4.1 (01/19) HGB 9.5* (01/19) PLT 247 (01/19)    HCT 28.2* (01/19)      %Neutrophils 60.0 (01/19) %Bands   %Lymphs 24.4 (01/19) %Monos 9.0 (01/19) %Eos 5.9 (01/19) %Basos 0.5 (01/19)  Na 140 (01/19) CL 99 (01/19) BUN 31* (01/19) GLU   106*  (01/19)   K 4.0 (01/19) CO2 28 (01/19) Cr 1.24* (01/19)      Ca 9.7 (01/19) Mg   Phos    TP 6.5 (01/19) ALT 12 (01/19) TBILI 0.71 (01/19) ALK PHOS  107 (01/19)   ALB 3.2* (01/19) AST 25 (01/19) DBILI            Microbiology Results (last 30 days)       Procedure Component Value - Date/Time    Candida Auris Nucleic Acid Detection Test Badger Other [[id number]] Collected: 01/04/24 1518    Lab Status: Final result Specimen: Swab from Other Updated: 01/04/24 2240     Candida auris Nucleic Acid Detection Test Not Detected     Comment: This test uses a real-time PCR assay for the   qualitative detection of DNA from Candida auris.  Because this test detects DNA, results may be   positive even when organisms are no longer   viable. This test was developed and its   performance characteristics determined by [facility]   [facility]. It has  not been cleared or approved by the [place]  Food and  Drug Administration (FDA). The FDA has   determined that such clearance or approval is   not necessary. This test is used for clinical   purposes. It should not be regarded as   investigational or for research use. This   laboratoy is certified under the Clinical   Laboratory Improvement Amendments (CLIA) as   qualified to perform high complexity clinical  laboratory testing.         Narrative:      Candida auris screening:  Single swab axilla and groin composite collection method:  Rub both sides of the swab tip over the left axilla skin  surface and then the right, targeting the crease in the skin  where the arm meets the body (i.e., swab both armpits,  swiping back and forth ~5 times per armpit).  With the same swab used on the axilla, rub both sides of the  swab tip over the left groin skin surface, targeting the  inguinal crease in the skin where the leg meets the pelvic  region and repeat with the right side (i.e., swab the skin of  both hip creases swiping back and forth ~5 times per hip  crease).     Methicillin Resistant Staphylococcus aureus (MRSA), Surveillance, Nasal Sherrin Right Nares [[id number]] Collected: 01/04/24 1518    Lab Status: Final result Specimen: Nares Updated: 01/05/24 2128     MRSA Surveillance Result No Methicillin Resistant Staphylococcus aureus isolated.            Imaging:   MRI Spine Survey W/WO IV Contrast    Result Date: 01/05/2024  IMPRESSION: Patient motion degrades assessment, particularly of the lower thoracic/lumbar spine. Susceptibility artifact from lumbar spine hardware also degrades assessment of the lumbar spine. Compared to the 12/02/2023 spine survey MRI and within be significant limitations, no convincing evidence disease progression (though see below). Subtle apparent enhancement within the ventral and dorsal epidural region at L1-L2 characterized on sagittal imaging only (14/13; axial imaging is nondiagnostic); this was mentioned on the preliminary report. The ventral component is new, though strongly favored to reflect CSF artifact, given CSF artifact in this region on the sagittal T2 series. The dorsal enhancement is grossly stable compared to the prior, and is likely degenerative in etiology. (i.e., related to adjacent segment disease). Epidural tumor is felt unlikely. Similar degree of scattered epidural/foraminal enhancement within the thoracic spine, for example on the right at T1-T2 and T2-T3; delineation between tumor and vasculature is challenging. Similar minimal enhancement along a few of the cauda equina nerve roots (noting that much of the cauda equina are poorly evaluated) is favored to be vascular or artifactual, with leptomeningeal enhancement felt less likely. Otherwise, no convincing new or increasing intrathecal, leptomeningeal, or epidural enhancement. Please note evaluation for abnormal epidural/foraminal enhancement is particularly challenging due to artifacts and vascular/venous plexus enhancement. Redemonstrated are multifocal areas of abnormal  signal intensity and enhancement throughout the visualized bones, likely related to osseous metastasis. This is grossly stable, though exact comparison is challenging. No new pathologic fractures identified. Redemonstrated are multiple postsurgical changes of the lumbar spine with stable 6 cm collection within the L3/L4 posterior surgical bed. Please note sterility of the collection is not determined by MRI. Redemonstrated degenerative findings with multilevel foraminal stenosis; see prior report for details. Redemonstrated mild grade 1 anterolisthesis of L5 on S1. Redemonstrated minimal retrolisthesis of L1 on L2. Consider dedicated imaging of the lumbar spine for further characterization, particularly of the neural foramina if there are radicular symptoms. Interval increase in opacification within the left-greater-than-right lower lobes, possibly worsening atelectasis and/or consolidation. Important findings delineated above were seen at 12:03 on 01/05/2024 and were verbally communicated by Divya Bolar  to the care provider, N MITTAL, at 12:03 on 01/05/2024.   CTRM:2002:verbal.    IR Tube Check    Result Date: 01/04/2024  IMPRESSION: Successful fluoroscopic guided left nephrostomy tube evaluation. Plan: Routine left nephrostomy tube exchange every 8-12 weeks.     X-Ray Femur Minimum 2 Views - Left    Result Date: 01/02/2024  IMPRESSION: No acute displaced fracture or dislocation. Redemonstration of multifocal osteosclerotic metastases of the left hemipelvis and proximal femur, better demonstrated on prior CT abdomen pelvis.  X-Ray Hip Unilateral With Pelvis 2 or 3 Views - Left    Result Date: 01/02/2024  IMPRESSION: No acute displaced fracture or dislocation. Redemonstration of multifocal osteosclerotic metastases of the imaged portion of the lower lumbar spine, pelvis, and left proximal femur, better demonstrated on prior CT from 11/28/2023. Redemonstration of lumbosacral hardware, incompletely evaluated.    X-Ray  Foot Complete Minimum 3 Views - Left    Result Date: 01/02/2024  IMPRESSION: No acute displaced fracture or dislocation. Bones are demineralized but no osteolytic or osteoblastic lesions. Minimal plantar calcaneal enthesopathy. Moderate degenerative changes of the first metatarsophalangeal joint. 2nd through fifth hammertoe deformities on this nonweightbearing study. Vascular calcifications are present.    X-Ray Tibia & Fibula 2 Views - Left    Result Date: 01/02/2024  IMPRESSION: No acute displaced fracture or dislocation. No osteolytic or osteoblastic lesions. No radiographic evidence of osteonecrosis. Vascular calcifications are present.    IR Change Nephrostomy Tube    Result Date: 12/19/2023  IMPRESSION: 1. Existing nephrostomy tube is well-positioned and patent. No apparent source for the leakage which is presumably around the catheter at this point. Catheter upsized from 10.2 French to 12 French. release    CT Abdomen And Pelvis W/O Contrast    Result Date: 12/19/2023  IMPRESSION: Right nephrectomy. No recurrent mass or lymphadenopathy. Probable gallbladder sludge. Left nephrostomy tube in place. No lymphadenopathy. Unremarkable adrenal glands and pancreas. No bowel obstruction. Large amount of stool in the rectosigmoid. No acute inflammatory process. Innumerable sclerotic lesions throughout the pelvis in the axial skeleton consistent with previous examination. Left inguinal hernia containing a loop of bowel. Left lung base infiltrate. release This patient received a total of 1 exposure event(s) during this CT examination. The CTDIvol and DLP radiation dose values for each exposure are:    IR Change Nephrostomy Tube    Result Date: 12/04/2023  IMPRESSION: Successful nephrostomy exchange on the left side with a 10.2 Fr tube. Plan: Routine nephrostomy exchange in 12 weeks.     MRI Spine Survey W/WO IV Contrast    Result Date: 12/02/2023  IMPRESSION: Patient motion and lumbar hardware susceptibility artifact  degrades assessment. Within these limitations: Redemonstrated postsurgical changes related to L2-S1 posterior instrumented fusion, posterior L2-L5 decompression, and L5-S1 anterior fixation. Interbody cages are again seen from L2-L3 through L5-S1. Stable size of a 6.2 x 2.4 cm (SI by AP) collection within the L3/L4 posterior surgical bed. Please note sterility of the collection cannot be determined by MRI. Interval increase diffuse osseous metastatic disease within the visualized bones, including the spine, partially imaged ribs and sacrum. No new pathologic fractures identified. Previously noted epidural extension within the midthoracic spine is difficult to delineate from vascular enhancement, though this may be increased within the midthoracic neural foramina. No convincing epidural extension into the bony spinal canal, though again this is difficult to definitively evaluate due to venous epidural enhancement (particularly dorsally at T4). Minimal enhancement along the cauda equina nerve roots at the L5-S1 levels on axial imaging is favored to be vascular, though a leptomeningeal processes not completely excluded with certainty. Otherwise, no convincing suspicious intrathecal, leptomeningeal, intramedullary, or epidural enhancement at evaluable levels, noting poor assessment at postsurgical levels due to susceptibility artifact from spinal hardware. Of note, no specific findings of osteomyelitis-discitis or epidural abscess. However, there is increased marrow edema due to progressive osseous metastasis, which makes superimposed infection difficult to exclude. Redemonstrated degenerative findings with moderate canal stenosis at C4-C5 and C5-C6. Otherwise, no more than mild canal  stenosis. No cord or cauda equina nerve root compression. Evaluation of the neural foramina of the lumbar spine is limited due to susceptibility artifact from hardware, however there is no convincing high-grade foraminal stenosis of the  lumbar spine. Probable moderate foraminal stenosis on the right at L1-L2 and possibly bilaterally at L4-L5, poorly characterized. Severe foraminal stenosis on the right at T2-T3 and possibly on the right at C7-T1, poorly characterized. Moderate-to-severe foraminal stenosis on the right at T1-T2 and T10-T11. Other scattered levels of up-to-moderate thoracic foraminal stenosis.     CT Abdomen And Pelvis W/O Contrast    Result Date: 11/29/2023  IMPRESSION: Interval retraction of the left nephrostomy tube, now terminating in the inferior pole cortex. This contributes to new moderate left hydronephrosis with superimposed perinephric/periureteral stranding, nonspecific but may represent inflammation/infection. Recommend consultation with interventional radiology for consideration of repositioning Consolidative left basilar opacities, likely infectious/inflammatory.. Large rectal stool ball without definite evidence of stercoral colitis. Consider disimpaction. Stable osseous and retroperitoneum metastatic disease. Note that assessment of solid organs is limited in the absence of intravenous contrast. Important findings delineated above were seen at 09:10 on 11/29/2023 and were verbally communicated by Collene Ida  to the care provider, Andrez Arlean Blake, MD, at 09:10 on 11/29/2023.   CTRM:2002:verbal.     US  Lower Extremity Venous Duplex Bilateral    Result Date: 11/28/2023  IMPRESSION: No DVT in the bilateral lower extremity.    X-Ray Chest Single View    Result Date: 11/28/2023  IMPRESSION: No pneumonia, oedema or pneumothorax. Subcutaneous venous port remains in place in the right chest wall. Mixed osseous changes with some sclerotic metastases less conspicuous, others new.    PET/CT Skull Base To Mid Thigh (Non Diag CT For [facility])    Result Date: 11/13/2023  IMPRESSION: 1. Compared to prior PET-CT 07/19/23 there has been overall interval progression of previous thoracic, mediastinal and abdominal lymph adenopathy. 2.  Although there has been interval improvement/resolution of previous multiple lesions in the sternum, spine and pelvis, there are multiple new lesions in L4, right posterior L2, and left lateral 4th and 7th ribs.     X-Ray Entire Spine Scoliosis 2 or 3 Views    Result Date: 11/06/2023  IMPRESSION: Coronal and sagittal balance as described. S-shaped curvature of the thoracolumbar spine as described. Known osseous metastatic disease depicted on previous MRI examinations is poorly demonstrated radiographically.    Ultrasound abdominal limited    Result Date: 10/30/2023  IMPRESSION: Unremarkable ultrasound of the gallbladder and liver release    CT T-Spine W/O IV Contrast    Result Date: 10/29/2023  IMPRESSION: 1. No acute findings in the cervical, thoracic spine. Stable diffuse sclerotic metastases when compared with the study from March 2024. No pathologic fractures. 2. No significant canal stenosis in the cervical, thoracic spine. 3. Unchanged severe right neural from narrowing T1-T2 and T2-T3. 4. Mild anterior mediastinal lymphadenopathy. release    CT C-Spine W/O Contrast    Result Date: 10/29/2023  IMPRESSION: 1. No acute findings in the cervical, thoracic spine. Stable diffuse sclerotic metastases when compared with the study from March 2024. No pathologic fractures. 2. No significant canal stenosis in the cervical, thoracic spine. 3. Unchanged severe right neural from narrowing T1-T2 and T2-T3. 4. Mild anterior mediastinal lymphadenopathy. release    CT Abdomen And Pelvis W/O Contrast    Result Date: 10/29/2023  IMPRESSION: 1. No acute inflammation abdomen or pelvis. 2. Constipation with stool-filled distention of the rectum. 3. Bowel containing left inguinal hernia.  No evidence of obstruction 4. Evolving appearance of the innumerable osteoblastic bone lesions throughout the axial and appendicular skeleton, similar in distribution but increased in sclerotic component in some of the bone lesions. This may reflect  interval treatment response. No pathologic fracture. 5. Percutaneous left nephrostomy tube in place. No significant hydronephrosis. release EXPOSURE : This patient received a total of 2 exposure event(s) during this CT examination. The CTDIvol and DLP radiation dose values for each exposure are:    X-ray portable chest single frontal view Reason: chest pain    Result Date: 10/29/2023  FINDINGS/IMPRESSION:   There is a right chest port with the tip terminating the SVC. The heart size is normal. There are retrocardiac and right basilar opacities which may reflect atelectasis, aspiration, or developing infection. No pleural effusion. No pneumothorax. release    IR Change Nephrostomy Tube    Result Date: 10/26/2023  IMPRESSION: Successful nephrostomy exchange on the left side with a 10.2 Fr tube. Plan: Routine nephrostomy exchange in 8-12 weeks.        Echo EF:   Lab Results   Component Value Date    LVEF 59 08/28/2023       The above data was reviewed by me today.          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       Plan:    # left low back/sacral pain with radicular radiation to both feet  # Cancer-related pain  Increased pain at home, he was previously able to ambulate but couldn't get out of bed since the weekend before admission. MRI spine survey with stable disease, no cord impingement. Received radiation single fraction 1/17 as previously planned  - s/p radiation single fraction to C7-T4 and L1-L4 on 1/17  - For now, pain control:              - Continue  home duloxetine  20 mg twice daily               - Increased gabapentin  to 400 mg TID              - Continue home tizanidine  2 mg Q8H PRN              - Dilaudid  4 mg PO Q4H PRN for moderate-severe pain              - Dilaudid  1 mg IV Q4H PRN rescue dose  - Scheduled tylenol  975 mg q8 hours  - seen by PT who recommended SNF prior to Radiation. Reassess now that pain is better after radiation  - if function remains poor, will need to discuss with primary oncologist for GOC. 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 Known mets to bones. On erdafitinib . Hospice had been discussed at last visit,  will need to readdress if functional state remains poor.  - Hold home erdafitinib  6 mg daily for now     # S/p L nephrostomy tube placement c/b  # ?Leaking  # Recurrent UTI's: Recently treated for UTI 12/31 at OSH w/ cefpodoxime but ultimately culture grew mixed flora. No e/o UTI at this time. Most recently had nephrostomy tubes replaced 12/31. Possibly some leaking from his nephrostomy site and has IR appointment on 1/16. Consulted IR who did drain check, PCN in appropriate position   - left neph tube checked without leakage.      # Constipation:   - Continue home senna and miralax , increase if needed     # Elevated Tbili:   Tbili 1.27 increased from 8 days ago when it was normal. No significant abdominal pain or N/V.      # Chronic normocytic anemia:   Suspect anemia of chronic disease due to malignancy.     # Gout:   Continue home allopurinol  100 mg daily     # MDD:   Continue home buspirone  20 mg twice daily      # Insomnia:   Continue home trazodone  50 mg nightly      Isolation: Immunocompromised Precautions  Level of Care: [facility]    Advance Care Planning   Advance Care Plan and/or Health Care Agent: already on file and current.    First Alternate Health Care Agent: Evert,Pamela-Sister GLENWOOD AHUMADA [phone]      Code Status: Full Code     Disposition Planning 01/07/2024:  Days to Complete Medical Plan of Care Requiring Ongoing Hospitalization: 2 days out --->Care Needs: Clinical Improvement/Symptom Control: pain, mobility                 (Discharge Planning and EDD Info)  Other remaining discharge needs: placement   Discharge location: To be determined  Outpatient Follow-up Needed: Oncology      I personally spent 45 total minutes in face-to-face and non-face-to-face activities related to the patient's visit today, excluding interpretation services and any separately reportable services/procedures  --  Ozell Fredrickson, MD  [facility]  Pager [phone]  PID# [id number]        ###

## 2024-01-08 LAB — COMPREHENSIVE METABOLIC PANEL, BLOOD
ALT (SGPT): 13 U/L (ref 0–41)
AST (SGOT): 23 U/L (ref 0–40)
Albumin: 2.9 g/dL — ABNORMAL LOW (ref 3.5–5.2)
Alkaline Phos: 102 U/L (ref 40–129)
Anion Gap: 10 mmol/L (ref 7–15)
BUN: 31 mg/dL — ABNORMAL HIGH (ref 8–23)
Bicarbonate: 31 mmol/L — ABNORMAL HIGH (ref 22–29)
Bilirubin, Tot: 0.76 mg/dL (ref <1.2)
Calcium: 9.6 mg/dL (ref 8.5–10.6)
Chloride: 99 mmol/L (ref 98–107)
Creatinine: 1.15 mg/dL (ref 0.67–1.17)
Glucose: 97 mg/dL (ref 70–99)
Potassium: 4.3 mmol/L (ref 3.5–5.1)
Sodium: 140 mmol/L (ref 136–145)
Total Protein: 6 g/dL (ref 6.0–8.0)
eGFR Based on CKD-EPI 2021 Equation: >60 mL/min/{1.73_m2}

## 2024-01-08 LAB — CBC WITH DIFF, BLOOD
ANC-Automated: 8.6 10*3/uL — ABNORMAL HIGH (ref 1.6–7.0)
Abs Basophils: 0 10*3/uL (ref <0.2)
Abs Eosinophils: 0.2 10*3/uL (ref 0.0–0.5)
Abs Lymphs: 1 10*3/uL (ref 0.8–3.1)
Abs Monos: 0.3 10*3/uL (ref 0.2–0.8)
Basophils: 0.1 %
Eosinophils: 1.9 %
Hct: 25.3 % — ABNORMAL LOW (ref 40.0–50.0)
Hgb: 8.4 g/dL — ABNORMAL LOW (ref 13.7–17.5)
Imm Gran %: 0.5 % (ref <1)
Imm Gran Abs: 0.1 10*3/uL — ABNORMAL HIGH (ref <0.1)
Lymphocytes: 9.8 %
MCH: 30.8 pg (ref 26.0–32.0)
MCHC: 33.2 g/dL (ref 32.0–36.0)
MCV: 92.7 um3 (ref 79.0–95.0)
MPV: 9.5 fL (ref 9.4–12.4)
Monocytes: 2.7 %
Plt Count: 236 10*3/uL (ref 140–370)
RBC: 2.73 10*6/uL — ABNORMAL LOW (ref 4.60–6.10)
RDW: 13.5 % (ref 12.0–14.0)
Segs: 85 %
WBC: 10.1 10*3/uL — ABNORMAL HIGH (ref 4.0–10.0)

## 2024-01-08 MED ORDER — SENNA 8.6 MG OR TABS
17.2000 mg | ORAL_TABLET | Freq: Two times a day (BID) | ORAL | Status: DC
Start: 2024-01-08 — End: 2024-01-10
  Administered 2024-01-09 – 2024-01-10 (×3): 17.2 mg via ORAL
  Filled 2024-01-08 (×3): qty 2

## 2024-01-08 MED ORDER — POLYETHYLENE GLYCOL 3350 OR PACK
17.0000 g | PACK | Freq: Two times a day (BID) | ORAL | Status: DC
Start: 2024-01-08 — End: 2024-01-10
  Administered 2024-01-09 (×2): 17 g via ORAL
  Filled 2024-01-08 (×2): qty 1

## 2024-01-08 NOTE — Progress Notes (Signed)
 HOSPITAL MEDICINE ATTENDING  DAILY PROGRESS NOTE      Subjective/Interval History:    Pain in low back and leg is better, has better mobility, able to get up with walker with assistance to bathroom      12 systems reviewed as per my usual practice with no additional information identified.     Objective   Vital Signs:   Temperature:  [98.2 F (36.8 C)-98.7 F (37.1 C)] 98.2 F (36.8 C) (01/20 0813)  Blood pressure (BP): (94-116)/(46-57) 94/54 (01/20 0813)  Heart Rate:  [53-68] 61 (01/20 0813)  Respirations:  [16-18] 16 (01/20 0813)  Pain Score: NA (pre med, non-pain or scheduled) (01/20 0603)  SpO2:  [95 %-98 %] 98 % (01/20 0813)    Wt Readings from Last 3 Encounters:   01/08/24 67 kg (147 lb 11.3 oz)   12/25/23 70.9 kg (156 lb 6.4 oz)   12/04/23 74.4 kg (164 lb)       01/19 0600 - 01/20 0559  In: 1238 [P.O.:1235; I.V.:3]  Out: 1525 [Urine:1525]  01/07/2024 0600 - 01/08/2024 0600  Urine x0  Stool x1  Emesis x0       Physical Exam:  General Appearance: frail, alert, no distress, pleasant affect, cooperative.  Neck:  Neck supple. No adenopathy, thyroid  symmetric, normal size.  Heart:  normal rate and regular rhythm, no murmurs, clicks, or gallops.  Lungs: clear to auscultation and percussion, no chest deformities noted.  Abdomen: Abdomen soft, non-tender. No masses or organomegaly. Bowel sounds normal.  Extremities:  no cyanosis, clubbing, or edema.  Skin:  sacrum 2 cm skin tear inferior to gluteal cleft on left.  Mental Status: Appearance/Cooperation: in no apparent distress, non-toxic, in no respiratory distress and acyanotic, alert, and oriented times 3      Allergies   Allergen Reactions    Sulfa Drugs Unspecified, Hives and Other       Scheduled Inpatient Medications   acetaminophen   975 mg Q8H    allopurinol   100 mg Daily    busPIRone   20 mg BID    DULoxetine   20 mg BID    fluticasone  propionate  2 spray Daily    gabapentin   400 mg TID    heparin   5,000 Units Q8H    maalox-lidocaine  viscous-diphenhydrAMINE   1:1:1 ratio  10 mL 4x Daily AC & HS    multivitamin with minerals  1 tablet Daily    polyethylene glycol  17 g Daily    senna  17.2 mg Daily    sodium chloride   3 mL Q8H       PRN Inpatient Medications   artificial tears  1 drop PRN    HYDROmorphone   4 mg Q4H PRN    nalOXone   0.1 mg Q2 Min PRN    prochlorperazine   10 mg Q6H PRN    sodium chloride   3 mL PRN    sodium chloride    Continuous PRN    tizanidine   2 mg Q8H PRN    traZODone   50 mg Nightly PRN       VTE prophylaxis/anticoagulation:    heparin   5,000 Units Subcutaneous Q8H   5,000 Units at 01/08/24 0604     Stool: Last BM: 01/07/24 1030   maalox-lidocaine  viscous-diphenhydrAMINE  1:1:1 ratio  10 mL 4x Daily AC & HS    polyethylene glycol  17 g Daily    senna  17.2 mg Daily      Analgesia:  acetaminophen   975 mg Q8H    HYDROmorphone   4 mg Q4H PRN      Feeding: Diet Regular  Nutritional Supplement - Oral Novasource Renal; Strawberry ; Deliver Supplements: BID   Glucose:   No results for input(s): GLUCPOCT in the last 72 hours.   Oxygen RA   Ambulation: Per PT   Lines/Foley: Patient Lines/Drains/Airways Status       Active PICC Line / CVC Line / PIV Line / Drain / Airway / Intraosseous Line / Epidural Line / ART Line / Line Type / Wound / Pressure Ulcer Injury       Name Placement date Placement time Site Days    Port A Cath Single Lumen  - 02/13/23 Right Internal jugular 02/13/23  0908  Internal jugular  329    Nephrostomy -  Left 03/21/23  1610  Left  292    Nephrostomy -  Left 04/27/23  1900  Left  255    Nephrostomy -  Left 11/29/23  1151  Left  39    Stent -  Ureteral (left) 12/24/21  1102  Ureteral (left)  744    Impaired Skin Integrity -  Skin tear Buttock(s) Right 01/06/24  1648  -- 1                     Laboratory data:   WBC 10.1* (01/20) HGB 8.4* (01/20) PLT 236 (01/20)    HCT 25.3* (01/20)      %Neutrophils 85.0 (01/20) %Bands   %Lymphs 9.8 (01/20) %Monos 2.7 (01/20) %Eos 1.9 (01/20) %Basos 0.1 (01/20)  Na 140 (01/20) CL 99 (01/20) BUN 31* (01/20)  GLU   97 (01/20)   K 4.3 (01/20) CO2 31* (01/20) Cr 1.15 (01/20)      Ca 9.6 (01/20) Mg   Phos    TP 6.0 (01/20) ALT 13 (01/20) TBILI 0.76 (01/20) ALK PHOS  102 (01/20)   ALB 2.9* (01/20) AST 23 (01/20) DBILI          Microbiology Results (last 30 days)       Procedure Component Value - Date/Time    Candida Auris Nucleic Acid Detection Test [facility] Other [[id number]] Collected: 01/04/24 1518    Lab Status: Final result Specimen: Swab from Other Updated: 01/04/24 2240     Candida auris Nucleic Acid Detection Test Not Detected     Comment: This test uses a real-time PCR assay for the   qualitative detection of DNA from Candida auris.  Because this test detects DNA, results may be   positive even when organisms are no longer   viable. This test was developed and its   performance characteristics determined by [facility]   [facility]. It has  not been cleared or approved by the [place]  Food and  Drug Administration (FDA). The FDA has   determined that such clearance or approval is   not necessary. This test is used for clinical   purposes. It should not be regarded as   investigational or for research use. This   laboratoy is certified under the Clinical   Laboratory Improvement Amendments (CLIA) as   qualified to perform high complexity clinical  laboratory testing.         Narrative:      Candida auris screening:  Single swab axilla and groin composite collection method:  Rub both sides of the swab tip over the left axilla skin  surface and then the right, targeting the crease in the skin  where the arm meets the body (  i.e., swab both armpits,  swiping back and forth ~5 times per armpit).  With the same swab used on the axilla, rub both sides of the  swab tip over the left groin skin surface, targeting the  inguinal crease in the skin where the leg meets the pelvic  region and repeat with the right side (i.e., swab the skin of  both hip creases swiping back and forth ~5 times per hip  crease).     Methicillin Resistant Staphylococcus aureus (MRSA), Surveillance, Nasal Sherrin Right Nares [[id number]] Collected: 01/04/24 1518    Lab Status: Final result Specimen: Nares Updated: 01/05/24 2128     MRSA Surveillance Result No Methicillin Resistant Staphylococcus aureus isolated.            Imaging:   MRI Spine Survey W/WO IV Contrast    Result Date: 01/05/2024  IMPRESSION: Patient motion degrades assessment, particularly of the lower thoracic/lumbar spine. Susceptibility artifact from lumbar spine hardware also degrades assessment of the lumbar spine. Compared to the 12/02/2023 spine survey MRI and within be significant limitations, no convincing evidence disease progression (though see below). Subtle apparent enhancement within the ventral and dorsal epidural region at L1-L2 characterized on sagittal imaging only (14/13; axial imaging is nondiagnostic); this was mentioned on the preliminary report. The ventral component is new, though strongly favored to reflect CSF artifact, given CSF artifact in this region on the sagittal T2 series. The dorsal enhancement is grossly stable compared to the prior, and is likely degenerative in etiology. (i.e., related to adjacent segment disease). Epidural tumor is felt unlikely. Similar degree of scattered epidural/foraminal enhancement within the thoracic spine, for example on the right at T1-T2 and T2-T3; delineation between tumor and vasculature is challenging. Similar minimal enhancement along a few of the cauda equina nerve roots (noting that much of the cauda equina are poorly evaluated) is favored to be vascular or artifactual, with leptomeningeal enhancement felt less likely. Otherwise, no convincing new or increasing intrathecal, leptomeningeal, or epidural enhancement. Please note evaluation for abnormal epidural/foraminal enhancement is particularly challenging due to artifacts and vascular/venous plexus enhancement. Redemonstrated are multifocal areas of abnormal  signal intensity and enhancement throughout the visualized bones, likely related to osseous metastasis. This is grossly stable, though exact comparison is challenging. No new pathologic fractures identified. Redemonstrated are multiple postsurgical changes of the lumbar spine with stable 6 cm collection within the L3/L4 posterior surgical bed. Please note sterility of the collection is not determined by MRI. Redemonstrated degenerative findings with multilevel foraminal stenosis; see prior report for details. Redemonstrated mild grade 1 anterolisthesis of L5 on S1. Redemonstrated minimal retrolisthesis of L1 on L2. Consider dedicated imaging of the lumbar spine for further characterization, particularly of the neural foramina if there are radicular symptoms. Interval increase in opacification within the left-greater-than-right lower lobes, possibly worsening atelectasis and/or consolidation. Important findings delineated above were seen at 12:03 on 01/05/2024 and were verbally communicated by Divya Bolar  to the care provider, N MITTAL, at 12:03 on 01/05/2024.   CTRM:2002:verbal.    IR Tube Check    Result Date: 01/04/2024  IMPRESSION: Successful fluoroscopic guided left nephrostomy tube evaluation. Plan: Routine left nephrostomy tube exchange every 8-12 weeks.     X-Ray Femur Minimum 2 Views - Left    Result Date: 01/02/2024  IMPRESSION: No acute displaced fracture or dislocation. Redemonstration of multifocal osteosclerotic metastases of the left hemipelvis and proximal femur, better demonstrated on prior CT abdomen pelvis.    X-Ray Hip Unilateral With Pelvis 2  or 3 Views - Left    Result Date: 01/02/2024  IMPRESSION: No acute displaced fracture or dislocation. Redemonstration of multifocal osteosclerotic metastases of the imaged portion of the lower lumbar spine, pelvis, and left proximal femur, better demonstrated on prior CT from 11/28/2023. Redemonstration of lumbosacral hardware, incompletely evaluated.    X-Ray  Foot Complete Minimum 3 Views - Left    Result Date: 01/02/2024  IMPRESSION: No acute displaced fracture or dislocation. Bones are demineralized but no osteolytic or osteoblastic lesions. Minimal plantar calcaneal enthesopathy. Moderate degenerative changes of the first metatarsophalangeal joint. 2nd through fifth hammertoe deformities on this nonweightbearing study. Vascular calcifications are present.    X-Ray Tibia & Fibula 2 Views - Left    Result Date: 01/02/2024  IMPRESSION: No acute displaced fracture or dislocation. No osteolytic or osteoblastic lesions. No radiographic evidence of osteonecrosis. Vascular calcifications are present.    IR Change Nephrostomy Tube    Result Date: 12/19/2023  IMPRESSION: 1. Existing nephrostomy tube is well-positioned and patent. No apparent source for the leakage which is presumably around the catheter at this point. Catheter upsized from 10.2 French to 12 French. release    CT Abdomen And Pelvis W/O Contrast    Result Date: 12/19/2023  IMPRESSION: Right nephrectomy. No recurrent mass or lymphadenopathy. Probable gallbladder sludge. Left nephrostomy tube in place. No lymphadenopathy. Unremarkable adrenal glands and pancreas. No bowel obstruction. Large amount of stool in the rectosigmoid. No acute inflammatory process. Innumerable sclerotic lesions throughout the pelvis in the axial skeleton consistent with previous examination. Left inguinal hernia containing a loop of bowel. Left lung base infiltrate. release This patient received a total of 1 exposure event(s) during this CT examination. The CTDIvol and DLP radiation dose values for each exposure are:    IR Change Nephrostomy Tube    Result Date: 12/04/2023  IMPRESSION: Successful nephrostomy exchange on the left side with a 10.2 Fr tube. Plan: Routine nephrostomy exchange in 12 weeks.     MRI Spine Survey W/WO IV Contrast    Result Date: 12/02/2023  IMPRESSION: Patient motion and lumbar hardware susceptibility artifact  degrades assessment. Within these limitations: Redemonstrated postsurgical changes related to L2-S1 posterior instrumented fusion, posterior L2-L5 decompression, and L5-S1 anterior fixation. Interbody cages are again seen from L2-L3 through L5-S1. Stable size of a 6.2 x 2.4 cm (SI by AP) collection within the L3/L4 posterior surgical bed. Please note sterility of the collection cannot be determined by MRI. Interval increase diffuse osseous metastatic disease within the visualized bones, including the spine, partially imaged ribs and sacrum. No new pathologic fractures identified. Previously noted epidural extension within the midthoracic spine is difficult to delineate from vascular enhancement, though this may be increased within the midthoracic neural foramina. No convincing epidural extension into the bony spinal canal, though again this is difficult to definitively evaluate due to venous epidural enhancement (particularly dorsally at T4). Minimal enhancement along the cauda equina nerve roots at the L5-S1 levels on axial imaging is favored to be vascular, though a leptomeningeal processes not completely excluded with certainty. Otherwise, no convincing suspicious intrathecal, leptomeningeal, intramedullary, or epidural enhancement at evaluable levels, noting poor assessment at postsurgical levels due to susceptibility artifact from spinal hardware. Of note, no specific findings of osteomyelitis-discitis or epidural abscess. However, there is increased marrow edema due to progressive osseous metastasis, which makes superimposed infection difficult to exclude. Redemonstrated degenerative findings with moderate canal stenosis at C4-C5 and C5-C6. Otherwise, no more than mild canal stenosis. No cord or cauda equina  nerve root compression. Evaluation of the neural foramina of the lumbar spine is limited due to susceptibility artifact from hardware, however there is no convincing high-grade foraminal stenosis of the  lumbar spine. Probable moderate foraminal stenosis on the right at L1-L2 and possibly bilaterally at L4-L5, poorly characterized. Severe foraminal stenosis on the right at T2-T3 and possibly on the right at C7-T1, poorly characterized. Moderate-to-severe foraminal stenosis on the right at T1-T2 and T10-T11. Other scattered levels of up-to-moderate thoracic foraminal stenosis.     CT Abdomen And Pelvis W/O Contrast    Result Date: 11/29/2023  IMPRESSION: Interval retraction of the left nephrostomy tube, now terminating in the inferior pole cortex. This contributes to new moderate left hydronephrosis with superimposed perinephric/periureteral stranding, nonspecific but may represent inflammation/infection. Recommend consultation with interventional radiology for consideration of repositioning Consolidative left basilar opacities, likely infectious/inflammatory.. Large rectal stool ball without definite evidence of stercoral colitis. Consider disimpaction. Stable osseous and retroperitoneum metastatic disease. Note that assessment of solid organs is limited in the absence of intravenous contrast. Important findings delineated above were seen at 09:10 on 11/29/2023 and were verbally communicated by Collene Ida  to the care provider, Andrez Arlean Blake, MD, at 09:10 on 11/29/2023.   CTRM:2002:verbal.     US  Lower Extremity Venous Duplex Bilateral    Result Date: 11/28/2023  IMPRESSION: No DVT in the bilateral lower extremity.    X-Ray Chest Single View    Result Date: 11/28/2023  IMPRESSION: No pneumonia, oedema or pneumothorax. Subcutaneous venous port remains in place in the right chest wall. Mixed osseous changes with some sclerotic metastases less conspicuous, others new.    PET/CT Skull Base To Mid Thigh (Non Diag CT For [facility])    Result Date: 11/13/2023  IMPRESSION: 1. Compared to prior PET-CT 07/19/23 there has been overall interval progression of previous thoracic, mediastinal and abdominal lymph adenopathy. 2.  Although there has been interval improvement/resolution of previous multiple lesions in the sternum, spine and pelvis, there are multiple new lesions in L4, right posterior L2, and left lateral 4th and 7th ribs.     X-Ray Entire Spine Scoliosis 2 or 3 Views    Result Date: 11/06/2023  IMPRESSION: Coronal and sagittal balance as described. S-shaped curvature of the thoracolumbar spine as described. Known osseous metastatic disease depicted on previous MRI examinations is poorly demonstrated radiographically.    Ultrasound abdominal limited    Result Date: 10/30/2023  IMPRESSION: Unremarkable ultrasound of the gallbladder and liver release    CT T-Spine W/O IV Contrast    Result Date: 10/29/2023  IMPRESSION: 1. No acute findings in the cervical, thoracic spine. Stable diffuse sclerotic metastases when compared with the study from March 2024. No pathologic fractures. 2. No significant canal stenosis in the cervical, thoracic spine. 3. Unchanged severe right neural from narrowing T1-T2 and T2-T3. 4. Mild anterior mediastinal lymphadenopathy. release    CT C-Spine W/O Contrast    Result Date: 10/29/2023  IMPRESSION: 1. No acute findings in the cervical, thoracic spine. Stable diffuse sclerotic metastases when compared with the study from March 2024. No pathologic fractures. 2. No significant canal stenosis in the cervical, thoracic spine. 3. Unchanged severe right neural from narrowing T1-T2 and T2-T3. 4. Mild anterior mediastinal lymphadenopathy. release    CT Abdomen And Pelvis W/O Contrast    Result Date: 10/29/2023  IMPRESSION: 1. No acute inflammation abdomen or pelvis. 2. Constipation with stool-filled distention of the rectum. 3. Bowel containing left inguinal hernia. No evidence of obstruction 4. Evolving  appearance of the innumerable osteoblastic bone lesions throughout the axial and appendicular skeleton, similar in distribution but increased in sclerotic component in some of the bone lesions. This may reflect  interval treatment response. No pathologic fracture. 5. Percutaneous left nephrostomy tube in place. No significant hydronephrosis. release EXPOSURE : This patient received a total of 2 exposure event(s) during this CT examination. The CTDIvol and DLP radiation dose values for each exposure are:    X-ray portable chest single frontal view Reason: chest pain    Result Date: 10/29/2023  FINDINGS/IMPRESSION:   There is a right chest port with the tip terminating the SVC. The heart size is normal. There are retrocardiac and right basilar opacities which may reflect atelectasis, aspiration, or developing infection. No pleural effusion. No pneumothorax. release    IR Change Nephrostomy Tube    Result Date: 10/26/2023  IMPRESSION: Successful nephrostomy exchange on the left side with a 10.2 Fr tube. Plan: Routine nephrostomy exchange in 8-12 weeks.        Echo EF:   Lab Results   Component Value Date    LVEF 59 08/28/2023       The above data was reviewed by me today.          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       Plan:    # left low back/sacral pain with radicular radiation to both feet  # Cancer-related pain  Increased pain at home, he was previously able to ambulate but couldn't get out of bed since the weekend before admission. MRI spine survey with stable disease, no cord impingement. Received radiation single fraction 1/17 as previously planned  - s/p radiation single fraction to C7-T4 and L1-L4 on 1/17  - For now, pain control:              - Continue  home duloxetine  20 mg twice daily               - Increased gabapentin  to 400 mg TID              - Continue home tizanidine  2 mg Q8H PRN              - Dilaudid  4 mg PO Q4H PRN for moderate-severe pain              - Dilaudid  1 mg IV Q4H PRN rescue dose  - Scheduled tylenol  975 mg q8 hours  - seen by PT who recommended SNF prior to Radiation. Reassess by PT now that pain is better after radiation  - if function remains poor, will need to discuss with primary oncologist for GOC. 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 Known mets to bones. On erdafitinib . Hospice had been discussed at last visit, will need to readdress  if functional state remains poor.  - Hold home erdafitinib  6 mg daily for now     # S/p L nephrostomy tube placement c/b  # ?Leaking  # Recurrent UTI's: Recently treated for UTI 12/31 at OSH w/ cefpodoxime but ultimately culture grew mixed flora. No e/o UTI at this time. Most recently had nephrostomy tubes replaced 12/31. Possibly some leaking from his nephrostomy site and has IR appointment on 1/16. Consulted IR who did drain check, PCN in appropriate position   - left neph tube checked without leakage.      # Constipation:   - Continue home senna and miralax , increase if needed     # Elevated Tbili, resolved:   Tbili 1.27 on admission, now normal. No significant abdominal pain or N/V.      # Chronic normocytic anemia:   Suspect anemia of chronic disease due to malignancy.     # Gout:   Continue home allopurinol  100 mg daily     # MDD:   Continue home buspirone  20 mg twice daily      # Insomnia:   Continue home trazodone  50 mg nightly      Isolation: Immunocompromised Precautions  Level of Care: [facility]    Advance Care Planning   Advance Care Plan and/or Health Care Agent: already on file and current.    First Alternate Health Care Agent: Evert,Pamela-Sister GLENWOOD AHUMADA [phone]      Code Status: Full Code     Disposition Planning 01/08/2024:  Days to Complete Medical Plan of Care Requiring Ongoing Hospitalization: Anticipated Tomorrow --->Care Needs: PT/OT Post-Discharge Recommendations and placement if needed                 (Discharge Planning and EDD Info)  Other remaining discharge needs: placement vs HHPT  Discharge location: To be determined  Outpatient Follow-up Needed: Oncology      I personally spent 45 total minutes in face-to-face and non-face-to-face activities related to the patient's visit today, excluding interpretation services and any separately reportable services/procedures  --  Ozell Fredrickson, MD  [facility]  Pager [phone]  PID# [id number]        ###

## 2024-01-08 NOTE — Plan of Care (Signed)
Problem: Promotion of Health and Safety  Goal: Promotion of Health and Safety  Description: The patient remains safe, receives appropriate treatment and achieves optimal outcomes (physically, psychosocially, and spiritually) within the limitations of the disease process by discharge.    Information below is the current care plan.  Flowsheets (Taken 01/07/2024 2015)  Individualized Interventions/Recommendations #1:   Fall precaution   Upper bed rails raised with bed kept in its lowest and locked position. Call bell secured in bed within patient's reach. Advised to call whenever help or assistance is needed. Compliant with instructions. Bed alarm engaged.  Individualized Interventions/Recommendations #2 (if applicable):   Assess for pain   No complaints of pain noted at this time. Available PRN analgesics during the shift discussed. Advised to call whenever med is needed for prompt pain management.  Individualized Interventions/Recommendations #3 (if applicable):   Aspiration precaution   Assist with meals and medication administration. Head of bed at 30 degrees,  Individualized Interventions/Recommendations #4 (if applicable): Cluster nursing care and interventions to promote rest and sleep.  Note:

## 2024-01-08 NOTE — Plan of Care (Signed)
Problem: Promotion of Health and Safety  Goal: Promotion of Health and Safety  Description: The patient remains safe, receives appropriate treatment and achieves optimal outcomes (physically, psychosocially, and spiritually) within the limitations of the disease process by discharge.    Information below is the current care plan.  Outcome: Progressing  Flowsheets  Taken 01/08/2024 0800 by Mickie Bail, RN  Patient /Family stated Goal: pain managed well  Taken 01/07/2024 2015 by Lennette Bihari, RN  Individualized Interventions/Recommendations #1:   Fall precaution   Upper bed rails raised with bed kept in its lowest and locked position. Call bell secured in bed within patient's reach. Advised to call whenever help or assistance is needed. Compliant with instructions. Bed alarm engaged.  Individualized Interventions/Recommendations #2 (if applicable):   Assess for pain   No complaints of pain noted at this time. Available PRN analgesics during the shift discussed. Advised to call whenever med is needed for prompt pain management.  Individualized Interventions/Recommendations #3 (if applicable):   Aspiration precaution   Assist with meals and medication administration. Head of bed at 30 degrees,  Individualized Interventions/Recommendations #4 (if applicable): Cluster nursing care and interventions to promote rest and sleep.  Taken 01/07/2024 0416 by Peter Congo, RN  Guidelines: Inpatient Nursing Guidelines  Note: Pt aaox3, forgetful at times but able to make needs known; here for [place]-related pain especially to L side; pt is ambulatory with walker; wound care consult pending for R sacrum skin tear; pending hospice consult as well. LBM 1/19 (normal), L PCN drain intact, draining well (does not void but rather through PCN only); VSS; continue mucositis protocol for mouth sores; R chest port intact; DC plan is TBD.

## 2024-01-08 NOTE — Plan of Care (Signed)
Problem: Promotion of Health and Safety  Goal: Promotion of Health and Safety  Description: The patient remains safe, receives appropriate treatment and achieves optimal outcomes (physically, psychosocially, and spiritually) within the limitations of the disease process by discharge.    Information below is the current care plan.  01/08/2024 0131 by Lennette Bihari, RN  Note:             Problem: Promotion of Health and Safety  Goal: Promotion of Health and Safety  Description: The patient remains safe, receives appropriate treatment and achieves optimal outcomes (physically, psychosocially, and spiritually) within the limitations of the disease process by discharge.    Information below is the current care plan.  01/08/2024 0131 by Lennette Bihari, RN  Note:             Problem: Promotion of Health and Safety  Goal: Promotion of Health and Safety  Description: The patient remains safe, receives appropriate treatment and achieves optimal outcomes (physically, psychosocially, and spiritually) within the limitations of the disease process by discharge.    Information below is the current care plan.  01/08/2024 0125 by Lennette Bihari, RN  Flowsheets (Taken 01/07/2024 2015)  Individualized Interventions/Recommendations #1:   Fall precaution   Upper bed rails raised with bed kept in its lowest and locked position. Call bell secured in bed within patient's reach. Advised to call whenever help or assistance is needed. Compliant with instructions. Bed alarm engaged.  Individualized Interventions/Recommendations #2 (if applicable):   Assess for pain   No complaints of pain noted at this time. Available PRN analgesics during the shift discussed. Advised to call whenever med is needed for prompt pain management.  Individualized Interventions/Recommendations #3 (if applicable):   Aspiration precaution   Assist with meals and medication administration. Head of bed at 30 degrees,  Individualized  Interventions/Recommendations #4 (if applicable): Cluster nursing care and interventions to promote rest and sleep.  Note:

## 2024-01-09 ENCOUNTER — Other Ambulatory Visit: Payer: Self-pay

## 2024-01-09 DIAGNOSIS — Z7409 Other reduced mobility: Secondary | ICD-10-CM

## 2024-01-09 LAB — CBC WITH DIFF, BLOOD
ANC-Automated: 2.9 10*3/uL (ref 1.6–7.0)
Abs Basophils: 0 10*3/uL (ref <0.2)
Abs Eosinophils: 0.2 10*3/uL (ref 0.0–0.5)
Abs Lymphs: 1 10*3/uL (ref 0.8–3.1)
Abs Monos: 0.3 10*3/uL (ref 0.2–0.8)
Basophils: 0.2 %
Eosinophils: 3.7 %
Hct: 27 % — ABNORMAL LOW (ref 40.0–50.0)
Hgb: 9 g/dL — ABNORMAL LOW (ref 13.7–17.5)
Imm Gran %: 0.2 % (ref <1)
Imm Gran Abs: 0 10*3/uL (ref <0.1)
Lymphocytes: 22.7 %
MCH: 30.8 pg (ref 26.0–32.0)
MCHC: 33.3 g/dL (ref 32.0–36.0)
MCV: 92.5 um3 (ref 79.0–95.0)
MPV: 9.5 fL (ref 9.4–12.4)
Monocytes: 6.7 %
Plt Count: 270 10*3/uL (ref 140–370)
RBC: 2.92 10*6/uL — ABNORMAL LOW (ref 4.60–6.10)
RDW: 13.5 % (ref 12.0–14.0)
Segs: 66.5 %
WBC: 4.3 10*3/uL (ref 4.0–10.0)

## 2024-01-09 LAB — COMPREHENSIVE METABOLIC PANEL, BLOOD
ALT (SGPT): 14 U/L (ref 0–41)
AST (SGOT): 22 U/L (ref 0–40)
Albumin: 3.2 g/dL — ABNORMAL LOW (ref 3.5–5.2)
Alkaline Phos: 106 U/L (ref 40–129)
Anion Gap: 11 mmol/L (ref 7–15)
BUN: 29 mg/dL — ABNORMAL HIGH (ref 8–23)
Bicarbonate: 31 mmol/L — ABNORMAL HIGH (ref 22–29)
Bilirubin, Tot: 0.53 mg/dL (ref <1.2)
Calcium: 9.9 mg/dL (ref 8.5–10.6)
Chloride: 100 mmol/L (ref 98–107)
Creatinine: 1.17 mg/dL (ref 0.67–1.17)
Glucose: 113 mg/dL — ABNORMAL HIGH (ref 70–99)
Potassium: 4.1 mmol/L (ref 3.5–5.1)
Sodium: 142 mmol/L (ref 136–145)
Total Protein: 6.5 g/dL (ref 6.0–8.0)
eGFR Based on CKD-EPI 2021 Equation: >60 mL/min/{1.73_m2}

## 2024-01-09 NOTE — Plan of Care (Signed)
Problem: Promotion of Health and Safety  Goal: Promotion of Health and Safety  Description: The patient remains safe, receives appropriate treatment and achieves optimal outcomes (physically, psychosocially, and spiritually) within the limitations of the disease process by discharge.    Information below is the current care plan.  Flowsheets (Taken 01/07/2024 2015)  Individualized Interventions/Recommendations #1:   Fall precaution   Upper bed rails raised with bed kept in its lowest and locked position. Call bell secured in bed within patient's reach. Advised to call whenever help or assistance is needed. Compliant with instructions. Bed alarm engaged.  Individualized Interventions/Recommendations #2 (if applicable):   Assess for pain   No complaints of pain noted at this time. Available PRN analgesics during the shift discussed. Advised to call whenever med is needed for prompt pain management.  Individualized Interventions/Recommendations #3 (if applicable):   Aspiration precaution   Assist with meals and medication administration. Head of bed at 30 degrees,  Individualized Interventions/Recommendations #4 (if applicable): Cluster nursing care and interventions to promote rest and sleep.  Note:

## 2024-01-09 NOTE — Consults (Signed)
ONCOLOGY CONSULT NOTE    Reason for Consult: Advanced UC, GOC  Physician Requesting Consult: Dr. Milta Deiters    Interval History:  73 yo M with advanced UC (see onc hx below) currently on erdafitinib, presented with worsening pain, he reports was in his feet, though notes suggest he was mostly complaining of back pain.  Since admission, MRI with diffuse disease (stable) and he received XRT to C7-T4 and L1-L4 in a single fraction on 1/17.  Now on oral meds.  Erdafitinib held on admission.     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 Multiple osseous lesions involving the bilateral iliac crest and right acetabulum concerning for osseous metastatic disease.   11/26/22  PET/CT: diffuse osseous FDG activity with discrete osseous lesions concerning for metastatic disease.  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  Also treated for septic shock, AKI (Cr 2.5) and acute liver injury (AST/ALT 2800/2100).   S/p exchange  L PCN.   02/07/23  start XRT to R Shoulder  02/10/23  C2D1 EV (dose reduced to 1.0 mg/kg) + pembro 200 mg IV  03/03/23  C3D1 EV/pembro, missed D8  03/06/23  Cysto: NED.  Cytology NED.   03/14/23  Seen at [facility] ED for dislodged tube, replaced  04/02-05/24  Admitted with weakness, poor PO intake.   04/06/23  C4D1 EV/pembro  04/13/23  C4D8 EV  04/26/23  PET/CT: osseous lesions much improved, many new sclerotic lesions c/w treatment change.  Nonspecific mediastinal and pelvic LN  05/09-11/24  Admitted for treatment of AKI and rhabdomyolysis  05/11/23  C5D1 EV/P DR to 0.75  06/01/23  C6D1 EV/Pembro 0.75  06/23/23  MRI C-Spine: disease in spine, but no fracture or epidural involvement.  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 2. Although there has been interval improvement/resolution of previous multiple lesions in the sternum, spine and pelvis, there are multiple new lesions in L4, right posterior L2, and left lateral 4th and 7th ribs.  11/23/23  Saw me, significant side effects  from erda (HFS, diarrhea), held erda  12/10-16/24  Admitted for UTI, constipation and   12/02/23  MRI spine: diffuse osseous disease, epidural extension at T4, minimal enhancement at L5-S1 of cauda equina nerve roots, no evidence of leptomeningeal disease    Review of Systems:  10/14 systems reviewed and otherwise negative except for aforementioned symptoms.     PMHx/Surg Hx/Fam Hx/Allergies Reviewed in Epic    Current Inpatient Meds Reviewed    Physical Exam:   01/09/24  0355 01/09/24  0430 01/09/24  0810 01/09/24  1200   BP: 113/61  (!) 121/50 (!) 108/58   Pulse:  65  54 72   Temp: 97.1 F (36.2 C)  97.6 F (36.4 C) 98.8 F (37.1 C)   Resp: 17 16 18 18    SpO2: 99%  94% 97%     ECOG PS 1  Walking in hallway with rolling walker  Thin, temporal wasting  RRR no mrg  CTAB no wrc  Abd soft  No edema  Feet mildly tender to palpation at heels.  Hands with healing founds on digits.    LABS  Lab Results   Component Value Date    NA 142 01/09/2024    K 4.1 01/09/2024    CL 100 01/09/2024    BICARB 31 (H) 01/09/2024    BUN 29 (H) 01/09/2024    CREAT 1.17 01/09/2024    GLU 113 (H) 01/09/2024    [place] 9.9 01/09/2024     Lab Results   Component Value Date    WBC 4.3 01/09/2024    HGB 9.0 (L) 01/09/2024    HCT 27.0 (L) 01/09/2024    PLT 270 01/09/2024    SEG 66.5 01/09/2024    LYMPHS 22.7 01/09/2024    MONOS 6.7 01/09/2024    EOS 3.7 01/09/2024     Lab Results   Component Value Date    AST 22 01/09/2024    ALT 14 01/09/2024    ALK 106 01/09/2024    TBILI 0.53 01/09/2024    TP 6.5 01/09/2024    ALB 3.2 (L) 01/09/2024     No results found for: "INR", "PTT"    Assessment and Recommendations:  73 yo M with advanced UC currently on erdafitinib presented with cancer related pain, s/p XRT to T/L spine.    I personally reviewed his labs and imaging during hospitalization.  His pain appears to be improved.  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  It seems improved since being off erda.  I'd favor restarting this now.  It is difficult to know if he is having a response given bone-predominant disease.  However, his LFTs seem to have responded to this therapy so I would be inclined to restart.      - restart erdafitnib 6 mg daily (if he is to go home today, he can start this tomorrow).  - pain control by palliative care  - he has a follow up with me on 1/31 scheduled    Oncology will sign off.  Please call me directly if I can be of any further assistance.     I spent a total of 70 minutes exclusive of separately  reportable services caring for this patient today to include reviewing  labs and other diagnostic testing, reviewing their medical records, seeing the patient, communicating with the patient's care providers, and documenting in the medical record.  Asa Saunas. Roseanne Reno, M.D.  Assistant Professor of Medicine   [employer] [place], [facility]

## 2024-01-09 NOTE — Progress Notes (Signed)
HOSPITAL MEDICINE ATTENDING  DAILY PROGRESS NOTE      Subjective/Interval History:    Pain has improved, had some left low back pain this morning but resolved at the time of my interview.  Ambulating on his own, uses a walker if he feels pain.  Overall, improving    12 systems reviewed as per my usual practice with no additional information identified.     Objective   Vital Signs:   Temperature:  [97.1 F (36.2 C)-98.8 F (37.1 C)] 98.8 F (37.1 C) (01/21 1200)  Blood pressure (BP): (101-121)/(50-62) 108/58 (01/21 1200)  Heart Rate:  [54-72] 72 (01/21 1200)  Respirations:  [16-18] 18 (01/21 1200)  Pain Score: 5 (01/21 1402)  O2 Device: None (Room air) (01/20 2352)  SpO2:  [94 %-100 %] 97 % (01/21 1200)    Wt Readings from Last 3 Encounters:   01/09/24 64.7 kg (142 lb 10.2 oz)   12/25/23 70.9 kg (156 lb 6.4 oz)   12/04/23 74.4 kg (164 lb)       01/20 0600 - 01/21 0559  In: 470 [P.O.:470]  Out: 1925 [Urine:1925]  01/08/2024 0600 - 01/09/2024 0600  Urine x1  Stool x6  Emesis x0       Physical Exam:  General Appearance: frail, alert, no distress, pleasant affect, cooperative.  Neck:  Neck supple. No adenopathy, thyroid symmetric, normal size.  Heart:  normal rate and regular rhythm, no murmurs, clicks, or gallops.  Lungs: clear to auscultation and percussion, no chest deformities noted.  Abdomen: Abdomen soft, non-tender. No masses or organomegaly. Bowel sounds normal.  Extremities:  no cyanosis, clubbing, or edema.  Mental Status: AO x 4      Allergies   Allergen Reactions    Sulfa Drugs Unspecified, Hives and Other       Scheduled Inpatient Medications   acetaminophen  975 mg Q8H    allopurinol  100 mg Daily    busPIRone  20 mg BID    DULoxetine  20 mg BID    fluticasone propionate  2 spray Daily    gabapentin  400 mg TID    heparin  5,000 Units Q8H    maalox-lidocaine viscous-diphenhydrAMINE 1:1:1 ratio  10 mL 4x Daily AC & HS    multivitamin with minerals  1 tablet Daily    polyethylene glycol  17 g BID    senna   17.2 mg BID    sodium chloride  3 mL Q8H       PRN Inpatient Medications   artificial tears  1 drop PRN    HYDROmorphone  4 mg Q4H PRN    nalOXone  0.1 mg Q2 Min PRN    prochlorperazine  10 mg Q6H PRN    sodium chloride  3 mL PRN    sodium chloride   Continuous PRN    tizanidine  2 mg Q8H PRN    traZODone  50 mg Nightly PRN       VTE prophylaxis/anticoagulation:    heparin  5,000 Units Subcutaneous Q8H   5,000 Units at 01/09/24 1402     Stool: Last BM: 01/09/24 0500   maalox-lidocaine viscous-diphenhydrAMINE 1:1:1 ratio  10 mL 4x Daily AC & HS    polyethylene glycol  17 g BID    senna  17.2 mg BID      Analgesia:  acetaminophen  975 mg Q8H    HYDROmorphone  4 mg Q4H PRN      Feeding: Diet Regular  Nutritional Supplement -  Oral Novasource Renal; Strawberry ; Deliver Supplements: BID   Glucose:   No results for input(s): "GLUCPOCT" in the last 72 hours.   Oxygen RA   Ambulation: Per PT   Lines/Foley: Patient Lines/Drains/Airways Status       Active PICC Line / CVC Line / PIV Line / Drain / Airway / Intraosseous Line / Epidural Line / ART Line / Line Type / Wound / Pressure Ulcer Injury       Name Placement date Placement time Site Days    Port A Cath Single Lumen  - 02/13/23 Right Internal jugular 02/13/23  0908  Internal jugular  330    Nephrostomy -  Left 03/21/23  1610  Left  294    Nephrostomy -  Left 04/27/23  1900  Left  256    Nephrostomy -  Left 11/29/23  1151  Left  41    Stent -  Ureteral (left) 12/24/21  1102  Ureteral (left)  746    Impaired Skin Integrity -  Skin tear Buttock(s) Right 01/06/24  1648  -- 2                     Laboratory data:   WBC 4.3 (01/21) HGB 9.0* (01/21) PLT 270 (01/21)    HCT 27.0* (01/21)      %Neutrophils 66.5 (01/21) %Bands   %Lymphs 22.7 (01/21) %Monos 6.7 (01/21) %Eos 3.7 (01/21) %Basos 0.2 (01/21)  Na 142 (01/21) CL 100 (01/21) BUN 29* (01/21) GLU   113* (01/21)   K 4.1 (01/21) CO2 31* (01/21) Cr 1.17 (01/21)      [place] 9.9 (01/21) Mg   Phos    TP 6.5 (01/21) ALT 14 (01/21) TBILI  0.53 (01/21) ALK PHOS  106 (01/21)   ALB 3.2* (01/21) AST 22 (01/21) DBILI          Microbiology Results (last 30 days)       Procedure Component Value - Date/Time    Candida Auris Nucleic Acid Detection Test Christianne Dolin Other [[id number]] Collected: 01/04/24 1518    Lab Status: Final result Specimen: Swab from Other Updated: 01/04/24 2240     Candida auris Nucleic Acid Detection Test Not Detected     Comment: This test uses a real-time PCR assay for the   qualitative detection of DNA from Candida auris.  Because this test detects DNA, results may be   positive even when organisms are no longer   viable. This test was developed and its   performance characteristics determined by [facility]   [facility]. It has  not been cleared or approved by the [name] Food and  Drug Administration (FDA). The FDA has   determined that such clearance or approval is   not necessary. This test is used for clinical   purposes. It should not be regarded as   investigational or for research use. This   laboratoy is certified under the Clinical   Laboratory Improvement Amendments (CLIA) as   qualified to perform high complexity clinical  laboratory testing.         Narrative:      Candida auris screening:  Single swab axilla and groin composite collection method:  Rub both sides of the swab tip over the left axilla skin  surface and then the right, targeting the crease in the skin  where the arm meets the body (i.e., swab both armpits,  swiping back and forth ~5 times per armpit).  With the  same swab used on the axilla, rub both sides of the  swab tip over the left groin skin surface, targeting the  inguinal crease in the skin where the leg meets the pelvic  region and repeat with the right side (i.e., swab the skin of  both hip creases swiping back and forth ~5 times per hip  crease).    Methicillin Resistant Staphylococcus aureus (MRSA), Surveillance, Nasal Christianne Dolin Nares [[id number]] Collected: 01/04/24 1518    Lab  Status: Final result Specimen: Nares Updated: 01/05/24 2128     MRSA Surveillance Result No Methicillin Resistant Staphylococcus aureus isolated.            Imaging:   MRI Spine Survey W/WO IV Contrast    Result Date: 01/05/2024  IMPRESSION: Patient motion degrades assessment, particularly of the lower thoracic/lumbar spine. Susceptibility artifact from lumbar spine hardware also degrades assessment of the lumbar spine. Compared to the 12/02/2023 spine survey MRI and within be significant limitations, no convincing evidence disease progression (though see below). Subtle apparent enhancement within the ventral and dorsal epidural region at L1-L2 characterized on sagittal imaging only (14/13; axial imaging is nondiagnostic); this was mentioned on the preliminary report. The ventral component is new, though strongly favored to reflect CSF artifact, given CSF artifact in this region on the sagittal T2 series. The dorsal enhancement is grossly stable compared to the prior, and is likely degenerative in etiology. (i.e., related to adjacent segment disease). Epidural tumor is felt unlikely. Similar degree of scattered epidural/foraminal enhancement within the thoracic spine, for example on the right at T1-T2 and T2-T3; delineation between tumor and vasculature is challenging. Similar minimal enhancement along a few of the cauda equina nerve roots (noting that much of the cauda equina are poorly evaluated) is favored to be vascular or artifactual, with leptomeningeal enhancement felt less likely. Otherwise, no convincing new or increasing intrathecal, leptomeningeal, or epidural enhancement. Please note evaluation for abnormal epidural/foraminal enhancement is particularly challenging due to artifacts and vascular/venous plexus enhancement. Redemonstrated are multifocal areas of abnormal signal intensity and enhancement throughout the visualized bones, likely related to osseous metastasis. This is grossly stable, though  exact comparison is challenging. No new pathologic fractures identified. Redemonstrated are multiple postsurgical changes of the lumbar spine with stable 6 cm collection within the L3/L4 posterior surgical bed. Please note sterility of the collection is not determined by MRI. Redemonstrated degenerative findings with multilevel foraminal stenosis; see prior report for details. Redemonstrated mild grade 1 anterolisthesis of L5 on S1. Redemonstrated minimal retrolisthesis of L1 on L2. Consider dedicated imaging of the lumbar spine for further characterization, particularly of the neural foramina if there are radicular symptoms. Interval increase in opacification within the left-greater-than-right lower lobes, possibly worsening atelectasis and/or consolidation. Important findings delineated above were seen at 12:03 on 01/05/2024 and were verbally communicated by Divya Bolar  to the care provider, N MITTAL, at 12:03 on 01/05/2024.   CTRM:2002:verbal.    IR Tube Check    Result Date: 01/04/2024  IMPRESSION: Successful fluoroscopic guided left nephrostomy tube evaluation. Plan: Routine left nephrostomy tube exchange every 8-12 weeks.     X-Ray Femur Minimum 2 Views - Left    Result Date: 01/02/2024  IMPRESSION: No acute displaced fracture or dislocation. Redemonstration of multifocal osteosclerotic metastases of the left hemipelvis and proximal femur, better demonstrated on prior CT abdomen pelvis.    X-Ray Hip Unilateral With Pelvis 2 or 3 Views - Left    Result Date: 01/02/2024  IMPRESSION: No acute displaced  fracture or dislocation. Redemonstration of multifocal osteosclerotic metastases of the imaged portion of the lower lumbar spine, pelvis, and left proximal femur, better demonstrated on prior CT from 11/28/2023. Redemonstration of lumbosacral hardware, incompletely evaluated.    X-Ray Foot Complete Minimum 3 Views - Left    Result Date: 01/02/2024  IMPRESSION: No acute displaced fracture or dislocation. Bones are  demineralized but no osteolytic or osteoblastic lesions. Minimal plantar calcaneal enthesopathy. Moderate degenerative changes of the first metatarsophalangeal joint. 2nd through fifth hammertoe deformities on this nonweightbearing study. Vascular calcifications are present.    X-Ray Tibia & Fibula 2 Views - Left    Result Date: 01/02/2024  IMPRESSION: No acute displaced fracture or dislocation. No osteolytic or osteoblastic lesions. No radiographic evidence of osteonecrosis. Vascular calcifications are present.    IR Change Nephrostomy Tube    Result Date: 12/19/2023  IMPRESSION: 1. Existing nephrostomy tube is well-positioned and patent. No apparent source for the leakage which is presumably around the catheter at this point. Catheter upsized from 10.2 [place] to 12 [place]. release    CT Abdomen And Pelvis W/O Contrast    Result Date: 12/19/2023  IMPRESSION: Right nephrectomy. No recurrent mass or lymphadenopathy. Probable gallbladder sludge. Left nephrostomy tube in place. No lymphadenopathy. Unremarkable adrenal glands and pancreas. No bowel obstruction. Large amount of stool in the rectosigmoid. No acute inflammatory process. Innumerable sclerotic lesions throughout the pelvis in the axial skeleton consistent with previous examination. Left inguinal hernia containing a loop of bowel. Left lung base infiltrate. release This patient received a total of 1 exposure event(s) during this CT examination. The CTDIvol and DLP radiation dose values for each exposure are:    IR Change Nephrostomy Tube    Result Date: 12/04/2023  IMPRESSION: Successful nephrostomy exchange on the left side with a 10.2 Fr tube. Plan: Routine nephrostomy exchange in 12 weeks.     MRI Spine Survey W/WO IV Contrast    Result Date: 12/02/2023  IMPRESSION: Patient motion and lumbar hardware susceptibility artifact degrades assessment. Within these limitations: Redemonstrated postsurgical changes related to L2-S1 posterior instrumented fusion,  posterior L2-L5 decompression, and L5-S1 anterior fixation. Interbody cages are again seen from L2-L3 through L5-S1. Stable size of a 6.2 x 2.4 cm (SI by AP) collection within the L3/L4 posterior surgical bed. Please note sterility of the collection cannot be determined by MRI. Interval increase diffuse osseous metastatic disease within the visualized bones, including the spine, partially imaged ribs and sacrum. No new pathologic fractures identified. Previously noted epidural extension within the midthoracic spine is difficult to delineate from vascular enhancement, though this may be increased within the midthoracic neural foramina. No convincing epidural extension into the bony spinal canal, though again this is difficult to definitively evaluate due to venous epidural enhancement (particularly dorsally at T4). Minimal enhancement along the cauda equina nerve roots at the L5-S1 levels on axial imaging is favored to be vascular, though a leptomeningeal processes not completely excluded with certainty. Otherwise, no convincing suspicious intrathecal, leptomeningeal, intramedullary, or epidural enhancement at evaluable levels, noting poor assessment at postsurgical levels due to susceptibility artifact from spinal hardware. Of note, no specific findings of osteomyelitis-discitis or epidural abscess. However, there is increased marrow edema due to progressive osseous metastasis, which makes superimposed infection difficult to exclude. Redemonstrated degenerative findings with moderate canal stenosis at C4-C5 and C5-C6. Otherwise, no more than mild canal stenosis. No cord or cauda equina nerve root compression. Evaluation of the neural foramina of the lumbar spine is limited due to  susceptibility artifact from hardware, however there is no convincing high-grade foraminal stenosis of the lumbar spine. Probable moderate foraminal stenosis on the right at L1-L2 and possibly bilaterally at L4-L5, poorly characterized.  Severe foraminal stenosis on the right at T2-T3 and possibly on the right at C7-T1, poorly characterized. Moderate-to-severe foraminal stenosis on the right at T1-T2 and T10-T11. Other scattered levels of up-to-moderate thoracic foraminal stenosis.     CT Abdomen And Pelvis W/O Contrast    Result Date: 11/29/2023  IMPRESSION: Interval retraction of the left nephrostomy tube, now terminating in the inferior pole cortex. This contributes to new moderate left hydronephrosis with superimposed perinephric/periureteral stranding, nonspecific but may represent inflammation/infection. Recommend consultation with interventional radiology for consideration of repositioning Consolidative left basilar opacities, likely infectious/inflammatory.. Large rectal stool ball without definite evidence of stercoral colitis. Consider disimpaction. Stable osseous and retroperitoneum metastatic disease. Note that assessment of solid organs is limited in the absence of intravenous contrast. Important findings delineated above were seen at 09:10 on 11/29/2023 and were verbally communicated by Channing Mutters  to the care provider, Ardelle Anton, MD, at 09:10 on 11/29/2023.   CTRM:2002:verbal.     [name] Lower Extremity Venous Duplex Bilateral    Result Date: 11/28/2023  IMPRESSION: No DVT in the bilateral lower extremity.    X-Ray Chest Single View    Result Date: 11/28/2023  IMPRESSION: No pneumonia, oedema or pneumothorax. Subcutaneous venous port remains in place in the right chest wall. Mixed osseous changes with some sclerotic metastases less conspicuous, others new.    PET/CT Skull Base To Mid Thigh (Non Diag CT For [facility])    Result Date: 11/13/2023  IMPRESSION: 1. Compared to prior PET-CT 07/19/23 there has been overall interval progression of previous thoracic, mediastinal and abdominal lymph adenopathy. 2. Although there has been interval improvement/resolution of previous multiple lesions in the sternum, spine and pelvis, there are  multiple new lesions in L4, right posterior L2, and left lateral 4th and 7th ribs.     X-Ray Entire Spine Scoliosis 2 or 3 Views    Result Date: 11/06/2023  IMPRESSION: Coronal and sagittal balance as described. S-shaped curvature of the thoracolumbar spine as described. Known osseous metastatic disease depicted on previous MRI examinations is poorly demonstrated radiographically.    Ultrasound abdominal limited    Result Date: 10/30/2023  IMPRESSION: Unremarkable ultrasound of the gallbladder and liver release    CT T-Spine W/O IV Contrast    Result Date: 10/29/2023  IMPRESSION: 1. No acute findings in the cervical, thoracic spine. Stable diffuse sclerotic metastases when compared with the study from March 2024. No pathologic fractures. 2. No significant canal stenosis in the cervical, thoracic spine. 3. Unchanged severe right neural from narrowing T1-T2 and T2-T3. 4. Mild anterior mediastinal lymphadenopathy. release    CT C-Spine W/O Contrast    Result Date: 10/29/2023  IMPRESSION: 1. No acute findings in the cervical, thoracic spine. Stable diffuse sclerotic metastases when compared with the study from March 2024. No pathologic fractures. 2. No significant canal stenosis in the cervical, thoracic spine. 3. Unchanged severe right neural from narrowing T1-T2 and T2-T3. 4. Mild anterior mediastinal lymphadenopathy. release    CT Abdomen And Pelvis W/O Contrast    Result Date: 10/29/2023  IMPRESSION: 1. No acute inflammation abdomen or pelvis. 2. Constipation with stool-filled distention of the rectum. 3. Bowel containing left inguinal hernia. No evidence of obstruction 4. Evolving appearance of the innumerable osteoblastic bone lesions throughout the axial and appendicular skeleton, similar in distribution  but increased in sclerotic component in some of the bone lesions. This may reflect interval treatment response. No pathologic fracture. 5. Percutaneous left nephrostomy tube in place. No significant  hydronephrosis. release EXPOSURE : This patient received a total of 2 exposure event(s) during this CT examination. The CTDIvol and DLP radiation dose values for each exposure are:    X-ray portable chest single frontal view Reason: chest pain    Result Date: 10/29/2023  FINDINGS/IMPRESSION:   There is a right chest port with the tip terminating the SVC. The heart size is normal. There are retrocardiac and right basilar opacities which may reflect atelectasis, aspiration, or developing infection. No pleural effusion. No pneumothorax. release    IR Change Nephrostomy Tube    Result Date: 10/26/2023  IMPRESSION: Successful nephrostomy exchange on the left side with a 10.2 Fr tube. Plan: Routine nephrostomy exchange in 8-12 weeks.        Echo EF:   Lab Results   Component Value Date    LVEF 59 08/28/2023       The above data was reviewed by me today.          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       Plan:    # left low back/sacral pain with radicular radiation to both feet  # Cancer-related pain  Increased pain at home, he was previously able to ambulate but couldn't get out of bed since the weekend before admission. MRI spine survey with stable disease, no cord impingement. Received radiation single fraction 1/17 as previously planned  - s/p radiation single fraction to C7-T4 and L1-L4 on 1/17  - For now, pain control:              - Continue home duloxetine 20 mg twice daily               - Increased gabapentin to 400 mg TID              - Continue home  tizanidine 2 mg Q8H PRN              - Dilaudid 4 mg PO Q4H PRN for moderate-severe pain  - Scheduled tylenol 975 mg q8 hours  - seen by PT who recommended SNF prior to Radiation. Reassess by PT now that pain is better after radiation, had HHPT on service prior to admission, favor dc home with HHPT  - appreciate Dr. Marca Ancona evaluation  - patient discussing signing on to service with hospice outpatient     # sacral skin tear:   -- Wound Nurse consult    # Metastatic urothelial carcinoma: Followed by Dr. Roseanne Reno. Known mets to bones. On erdafitinib. Hospice had been discussed at last visit, will need to readdress if functional state remains poor.  - Hold home erdafitinib 6 mg daily for now, ok to resume on dc     # S/p L nephrostomy tube placement c/b  # ?  Leaking  # Recurrent UTI's: Recently treated for UTI 12/31 at OSH w/ cefpodoxime but ultimately culture grew mixed flora. No e/o UTI at this time. Most recently had nephrostomy tubes replaced 12/31. Possibly some leaking from his nephrostomy site and has IR appointment on 1/16. Consulted IR who did drain check, PCN in appropriate position   - left neph tube checked without leakage.      # Constipation:   - Continue home senna and miralax, increase if needed  - having regular BMs now     # Elevated Tbili, resolved:   Tbili 1.27 on admission, now normal. No significant abdominal pain or N/V.      # Chronic normocytic anemia:   Suspect anemia of chronic disease due to malignancy.     # Gout:   Continue home allopurinol 100 mg daily     # MDD:   Continue home buspirone 20 mg twice daily      # Insomnia:   Continue home trazodone 50 mg nightly      Isolation: Immunocompromised Precautions  Level of Care: [facility]    Advance Care Planning   Advance Care Plan and/or Health Care Agent: already on file and current.    First Alternate Health Care Agent: Evert,Pamela-Sister Maricela Curet [phone]     Code Status: Full Code     Disposition Planning 01/09/2024:  Days to Complete  Medical Plan of Care Requiring Ongoing Hospitalization: Anticipated Tomorrow --->Care Needs: PT/OT Post-Discharge Recommendations and placement if needed                 (Discharge Planning and EDD Info)  Other remaining discharge needs: HHPT  Discharge location: To be determined  Outpatient Follow-up Needed: Oncology      Milta Deiters, DO  Assistant Clinical Professor  Department of Medicine  [phone]

## 2024-01-09 NOTE — Interdisciplinary (Signed)
Physical Therapy Daily Treatment Note    Admitting Physician:  Milta Deiters, DO  Admission Date 01/02/2024    Inpatient Diagnosis:   Problem List         Codes    Impaired mobility [Z74.09]    -  Primary ICD-10-CM: Z74.09  ICD-9-CM: 799.89    Impaired functional mobility, balance, gait, and endurance [Z74.09]     ICD-10-CM: Z74.09  ICD-9-CM: V49.89            IP Start of Service   Start of Care: 01/05/24  Onset Date: 01/01/2023  Reason for referral: Activity tolerance limitation;Decline in functional ability/mobility;Range of motion/strength limitations    Preferred Language:English         Past Medical History:   Diagnosis Date    Chronic back pain     Congenital hydronephrosis     Gout     Headache     Hematuria     HTN (hypertension) 12/10/2021    Kidney disease     Kidney stones     Major depressive disorder, single episode     Polyarthropathy or polyarthritis of multiple sites     Retinal detachment     Urethral stricture       Past Surgical History:   Procedure Laterality Date    CT INSERTION OF SUPRAPUBIC CATH  09/25/2015    NEPHRECTOMY Right 1955    APPENDECTOMY      COLONOSCOPY      CYSTOSCOPY      CYSTOSCOPY W/ LASER LITHOTRIPSY      OTHER SURGICAL HISTORY      Interstim 01/29/2011    SPINE SURGERY  09/21    Lumbar-sacral fusion    TRANSURETHRAL RESECTION OF PROSTATE          PT Acute       Row Name 01/09/24 1900          Type of Visit    Type of Physical Therapy note Physical Therapy Daily Treatment Note       Row Name 01/09/24 1900          Treatment Precautions/Restrictions    Precautions/Restrictions Fall     Fall Socks/charm       Row Name 01/09/24 1900          Medical History    History of presenting condition Pt is a 73 year old male with a history of congenital R hydronephrosis s/p R nephrectomy, OA, HTN, MDD, gout, metastatic urothelial carcinoma (bones, lymph nodes) s/p L nephrostomy tube currently on PO erdafitinib c/b diffuse cancer-related pain, recently diagnosed UTI (12/31) s/p  nephrostomy tube exchange and cefpodoxime, and recently diagnosed LUE cellulitis (12/27/23) s/p cephalexin who presented w/ diffuse L sided pain.     Fall history No falls reported in the last 6 months     Other Past Medical History Information Does have known mets to spine. Last MRI spine was 12/02/23 and showed increased diffuse osseous mets within the spine, ribs, and sacrum without new pathologic fractures as well as possibly increased epidural extension within the midthoracic spine. X-rays from 1/14 confirm no acute fracture       Row Name 01/09/24 1900          Subjective    Subjective Information Patient received sitting EOB and keen in participating in PT session     Patient status Nursing in agreement for treatment;Patient agreeable to treatment;Patient pain control adequate to participate in therapy       Row Name  01/09/24 1900          Pain Assessment    Pain Asssessment Tool Numeric Pain Rating Scale       Row Name 01/09/24 1900          Numeric Pain Rating Scale    Pain Intensity - rating at present 5     Pain Intensity- rating after treatment 5     Location bil sole of feet secondary to neuropathy       Row Name 01/09/24 1900          Objective    Overall Cognitive Status Intact - no cognitive limitations or impairments noted     Communication No communication limitations or impairments noted. Current status of hearing, speech and vision allow functional communication.     Coordination/Motor control No limitations or impairments noted. Movement patterns are fluid and coordinated throughout     Balance Balance limitations present     Static Sitting Balance Good - able to maintain balance without handhold support, limited postural sway     Dynamic Sitting Balance Good - accepts moderate challenge, able to maintain balance while picking object off floor     Static Standing Balance Good - able to maintain balance without handhold support, limited postural sway     Dynamic Standing Balance Good - accepts  moderate challenge, able to maintain balance while picking object off floor     Other Balance Information standing balance with FWW     LLE findings grossly muslce strength 4-/5     RLE findings grossy muslce strength 4-/5     Other  Extremity Assessment  Information patient reported numbness and pain bil sole of foot.     Functional Mobility Functional mobility deficits present     Bed Mobility Supervised     Transfers to/from Stand Supervised     Transfer Comments with FWW     Gait Supervised  SBA     Gait Comments Slow walking speed however step through gait patteen.     Device used for [employer] 150 ft     Other Objective Findings RN cleared the patient.  Patient receieved sititng EOB.  Patient is supervison with all functional mobility.  Amb 150 ft with FWW and SBA/supervision with step through gait pattern. Pain better controlled today.  Patient partiicpated in standing balance exercises with FWW- spot march x 10 each, heel raises x 10, toes raises x 10, alt knee flex x 10 each.  STS from chair x 10 with 1 UE support and FWW.  Patient returned sitting in bed with call bell within reach.  Nursing staff informed.                          Eval cont.       Row Name 01/09/24 1900          [place] AM-PAC: Basic Mobility    Assistance Needed to Turn from Back to Side While in a Flat Bed Without Using Bedrails 3 - A little (supervised/min assist)     Difficulty with Supine to Sit Transfer 3 - A little (supervised/min assist)     How Much Help Needed to Move to/from Bed to Chair 3 - A little (supervised/min assist)     Difficulty with Sit to Stand Transfer from Chair with Arms 3 - A little (supervised/min assist)     How Much Help Needed to  Walk in Room 3 - A little (supervised/min assist)     How Much Help Needed to Climb 3-5 Steps with a Rail 3 - A little (supervised/min assist)     AMPAC Total Score 18     Assessment: AM-PAC Basic Mobility Impairment Rating Score  13-18 - 40-59% impaired       Row Name 01/09/24 1900          Patient/Family Education    Learner(s) Patient     Learner response to rehab patient education interventions Verbalizes understanding;Able to return demonstrate teaching     Patient/family training comments PT role, PT POC, benefits/importance of OOB activity, HEP, safety recommendations, Discharge recommendation discussed.       Row Name 01/09/24 1900          Assessment    Assessment Patient seen for IP skilled PT intervention.  Patient has made significant progress in functional mobility since the last PT session.  Patient was able to ambulate 150ft with FWW and SBA/supervision and is at supervision level for bed mobility and transfers including STS with FWW.  Patient showed improved dynamics standing balance with FWW.  Patient will benefit from continued skilled PT intervention to improve dynamic standing balance, bil LE muslce strength and decrease fall risk.  Patient returned sitting in bed with call bell within reach.  Nursing staff informed.  POST ACUTE REHABILITATION DISCHARGE RECOMMENDATIONS:    Recommended Post-Acute Therapy Follow up:   Home Health    Patient appropriate for discharge to prior living situation    Deficits :   Bed mobility: assist level: Supervision  Transfers: assist level: Supervision  Gait: assist level: Supervision      Other Post Acute Discharge Recommendations : Needs supervision from family at night if discharged home.     Equipment recommendations:   Other    Patient has a SPC, FWW and a 4WW at home.        Rehab Potential Excellent       Row Name 01/09/24 1900          Patient stated Goal    Patient stated goal want to go home       Row Name 01/09/24 1900          Planned Therapy Interventions and Rationale    Gait Training to normalize gait pattern and improve safety while ambulating with assistive device     Neuromuscular Re-Education to improve safety during dynamic activities;to improve kinesthetic awareness and  postural control     Therapeutic Activities to improve functional mobility and ability to navigate in the home and/or community;to improve transfers between surfaces     Theraputic Exercise to improve activity tolerance to allow greater independence with functional mobility skills;to increase strength to allow greater independence with functional mobility skills       Row Name 01/09/24 1900          Treatment Plan Disussion    Treatment Plan Discussion and Agreement Patient/family/caregiver stated understanding and agreement with the therapy plan       Row Name 01/09/24 1900          Treatment Plan    Continue therapy to address Decline in functional ability/mobility     Frequency of treatment 3 times per week     Duration of treatment (number of visits) While patient is hospitalized and in need of skilled therapy services     Status of treatment Patient evaluated and will benefit from ongoing skilled therapy  Row Name 01/09/24 1900          Patient Safety Considerations     Patient safety considerations Patient returned to bed at end of treatment;Patient left sitting at end of treatment;Call light left in reach and fall precautions in place;Nursing notified of safety considerations at end of treatment;Patient may be at risk for falls     Patient assistive device requirements for safe ambulation Lupita Shutter Name 01/09/24 1900          Therapy Plan Communication    Therapy Plan Communication Discussed therapy plan and patient's mobility status with Case Manager;Discussed therapy plan with Nursing and/or Physician     Encouraged out of bed with assistance by Nursing;Staff       Row Name 01/09/24 1900          Physical Therapy Patient Discharge Instructions    Your Physical Therapist suggests the following Continue to complete your home exercise program daily as instructed;Continue to follow your prescribed mobility precautions when moving in and out of bed and walking  as instructed;Continue to use correct  body mechanics when moving in and out of bed as instructed;Supervision with walking is suggested for increased safety;Continue to use your assistive device as instructed when walking to improve your stability and prevent falls     Other Physical Therapy Instructions Encourage patient to ambulate in the hallway with FWW and SBA with nursing staff x 3 times a day.       Row Name 01/09/24 1900          Therapeutic Procedures    Gait Training (16109) Assistive device training;Patient education;Gait pattern analysis and treatment of deviations        Total TIMED Treatment (min)  20     Neuromuscular re-education [phone])  Anticipatory postural adjustment training;Balance activities to improve control of center of gravity over base of support;Patient education;Static balance training        Total TIMED Treatment (min)  15     Therapeutic Activities [phone])  Assistance/facilitation of bed mobility;Transfer training with weight shift and direction change;Weight shift activities to improve safety in unsupported sitting or standing;Patient education;Progressive mobilization to improve functional independence        Total TIMED Treatment (min)  10       Row Name 01/09/24 1900          Treatment Time     Total TIMED Treatment  (min) 45     Total Treatment Time (min) 45     Treatment start time 1800                        The physical therapist of record is endorsed by evaluating physical therapist.

## 2024-01-09 NOTE — Interdisciplinary (Signed)
01/09/24 1449   Follow Up/Progress   Is the Patient Medically Stable for Discharge * No   CM Discharge Arrangements Complete No   Where was the patient admitted from? * Home   Type of Home Care Services on Admission None   Barriers to Discharge * None   Anticipated Discharge Disposition/Needs * HH PT;Home PT/OT   Post Acute Services Referred To Home Health Agencies   Home Health agencies status  Pending   PATIENT CHOICE: Patient/Family/Legal/Surrogate Decision Maker Has Been Given a List Options And Choice In The Selection of Post-Acute Care Providers Yes   CM discussed the following with pt, and/or family, and/or DPOA [place] has agreements with select post-acute care providers in the collaborative care network;If patient has chosen Retail buyer or [facility] of [facility] for post-acute care, they were informed that we partner with, and have a financial interest in these organizations   Family/Caregiver's Assessed for * Readiness, willingness, and ability to provide or support self-management activities;Readiness to provide care to the patient   Respite Care * Not Applicable   Patient/Family/Other Are In Agreement With Discharge Plan * To be determined   Public Health Clearance Needed * Not Applicable   Anticipated DischargeTransportation *  Taxi   Anticipated Discharge Transportation Details * SW to arrange transport through [facility]     01/09/24  2:49 PM    Medical Intervention(s) requiring continued Hospital Stay:  Since admission, MRI with diffuse disease (stable) and he received XRT to C7-T4 and L1-L4 in a single fraction on 1/17.   - For now, pain control:              - Continue home duloxetine 20 mg twice daily               - Increased gabapentin to 400 mg TID              - Continue home tizanidine 2 mg Q8H PRN              - Dilaudid 4 mg PO Q4H PRN for moderate-severe pain              - Dilaudid 1 mg IV Q4H PRN rescue dose  - Scheduled tylenol 975 mg q8 hours      Anticipated  dispo plan & anticipated DC needs:  This [name] at bedside to discuss DC plan.   Pt made aware PT is recommending SNF upon DC, however pt is declining SNF at this time.   Pt states he lives with his sister who works from home and is able to provide assistance.  Pt requested for home health with [facility]. Referral sent   Home Health RN, PT and OT orders pended.     Anticipated Transportation @ discharge:  Pt requesting for assistance with transportation through his insurance- [employer]. SW consult placed.     Barriers to Discharge:  None     Pearletha Alfred, RN, BSN  Care Manager  [phone]

## 2024-01-09 NOTE — Consults (Signed)
Wound/Ostomy RN Consult    Admit date: 01/02/2024    Stephen [name] is a 73 year old male  Unit: 3309/3309    Sulfa drugs  Reason for consult: Initial Wound  Wound/Ostomy Type: Wound   Present on Admission:  No  Medial buttocks   Admit diagnosis: Cancer associated pain [G89.3]  Impaired mobility [Z74.09]  Pain in limb, unspecified [M79.60]  Impaired functional mobility, balance, gait, and endurance [Z74.09]        Past Medical History:   Diagnosis Date    Chronic back pain     Congenital hydronephrosis     Gout     Headache     Hematuria     HTN (hypertension) 12/10/2021    Kidney disease     Kidney stones     Major depressive disorder, single episode     Polyarthropathy or polyarthritis of multiple sites     Retinal detachment     Urethral stricture        CBC  Recent Labs     01/07/24  0600 01/08/24  0500 01/09/24  0530   WBC 4.1 10.1* 4.3   RBC 3.04* 2.73* 2.92*   HGB 9.5* 8.4* 9.0*   HCT 28.2* 25.3* 27.0*   MCV 92.8 92.7 92.5   MCH 31.3 30.8 30.8   MCHC 33.7 33.2 33.3   RDW 13.6 13.5 13.5   MPV 9.5 9.5 9.5   PLT 247 236 270   SEG 60.0 85.0 66.5   LYMPHS 24.4 9.8 22.7   MONOS 9.0 2.7 6.7   EOS 5.9 1.9 3.7   BASOS 0.5 0.1 0.2       Chemistry  Recent Labs     01/07/24  0600 01/08/24  0500 01/09/24  0530   NA 140 140 142   K 4.0 4.3 4.1   CL 99 99 100   BICARB 28 31* 31*   BUN 31* 31* 29*   CREAT 1.24* 1.15 1.17   GLU 106* 97 113*   [place] 9.7 9.6 9.9       LFTs  Recent Labs     01/07/24  0600 01/08/24  0500 01/09/24  0530   ALK 107 102 106   AST 25 23 22    ALT 12 13 14    TBILI 0.71 0.76 0.53   ALB 3.2* 2.9* 3.2*       Coags  No results for input(s): "PT", "PTT", "INR" in the last 72 hours.    No results for input(s): "PREALB" in the last 2160 hours.    No results for input(s): "A1C" in the last 2160 hours.    Blood Culture (no units)   Date Value   01/12/2022     Enterococcus faecalis  Anaerobic Bottle:  For susceptibility results refer to culture #[id number]  Collected date:01/10/22  Collected  time:1558  .  Identification performed by [employer] [facility]( [facility]di-ToF). This test  was developed and its performance characteristics determined by [facility]. It has not been cleared or  approved by the U.S. Food and Drug Administration.  The FDA has  determined that such clearance or approval is not necessary.         Surgical History   Past Surgical History:   Procedure Laterality Date    CT INSERTION OF SUPRAPUBIC CATH  09/25/2015    NEPHRECTOMY Right 1955    APPENDECTOMY      COLONOSCOPY      CYSTOSCOPY  CYSTOSCOPY W/ LASER LITHOTRIPSY      OTHER SURGICAL HISTORY      Interstim 01/29/2011    SPINE SURGERY  09/21    Lumbar-sacral fusion    TRANSURETHRAL RESECTION OF PROSTATE         Current Nutrition: Diet Type: Regular    Diet Status: PO    Moisture and incontinence associated skin damage (MASD/IAD)    Location:  inner buttocks (right)     Type of wound: Moisture and incontinence associated skin damage (MASD/IAD)    Stage: n/a    Color/type of wound tissue: Excoriation with partial thickness moisture damage. Site is not over a hard area. Patient informs me he acquired  wound while wiping vigorously.     Periwound area: pink     Exudate/Drainage: none    Tunneling/undermining: none    Odor: none    Wound Margins defined? Yes     Barriers to healing:Impaired mobility and incontinence       Current Goal of Treatment: manage moisture     Wound Care Recommendations:   Buttocks medial   -Cleanse with  Peri cleanser and pat dry.   -Apply Stoma powder to all open areas, dust off the excess powder then  -Apply Zinc Guard moisture barrier cream BID and prn  -No foam mepilex needed.     Patient/Family Education: Informed patient o how to protect skin with barrier zinc cream.     Findings and recommendations communicated to: Primary  RN Gerilyn Pilgrim     Plan: Will sign off. Reconsult for new or deteriorating wounds    Wound Care nurse: Luz Lex, RN

## 2024-01-09 NOTE — Plan of Care (Signed)
Problem: Promotion of Health and Safety  Goal: Promotion of Health and Safety  Description: The patient remains safe, receives appropriate treatment and achieves optimal outcomes (physically, psychosocially, and spiritually) within the limitations of the disease process by discharge.    Information below is the current care plan.  Outcome: Progressing  Flowsheets  Taken 01/09/2024 1747 by Lianne Cure, RN  Outcome Evaluation (rationale for progressing/not progressing) every shift: vital signs stable no confusion noted pain well controled ambulated in hallway nephrostomy bag emptied poc on going  Taken 01/09/2024 0804 by Lianne Cure, RN  Patient /Family stated Goal: pain control  Taken 01/07/2024 2015 by Lennette Bihari, RN  Individualized Interventions/Recommendations #1:   Fall precaution   Upper bed rails raised with bed kept in its lowest and locked position. Call bell secured in bed within patient's reach. Advised to call whenever help or assistance is needed. Compliant with instructions. Bed alarm engaged.  Individualized Interventions/Recommendations #2 (if applicable):   Assess for pain   No complaints of pain noted at this time. Available PRN analgesics during the shift discussed. Advised to call whenever med is needed for prompt pain management.  Individualized Interventions/Recommendations #3 (if applicable):   Aspiration precaution   Assist with meals and medication administration. Head of bed at 30 degrees,  Individualized Interventions/Recommendations #4 (if applicable): Cluster nursing care and interventions to promote rest and sleep.  Taken 01/07/2024 0416 by Peter Congo, RN  Guidelines: Inpatient Nursing Guidelines  Note:

## 2024-01-10 ENCOUNTER — Other Ambulatory Visit: Payer: Self-pay

## 2024-01-10 ENCOUNTER — Ambulatory Visit (HOSPITAL_BASED_OUTPATIENT_CLINIC_OR_DEPARTMENT_OTHER): Payer: Medicare Other

## 2024-01-10 LAB — CBC WITH DIFF, BLOOD
ANC-Automated: 2.6 10*3/uL (ref 1.6–7.0)
Abs Basophils: 0 10*3/uL (ref <0.2)
Abs Eosinophils: 0.2 10*3/uL (ref 0.0–0.5)
Abs Lymphs: 1.2 10*3/uL (ref 0.8–3.1)
Abs Monos: 0.3 10*3/uL (ref 0.2–0.8)
Basophils: 0.5 %
Eosinophils: 5.3 %
Hct: 25.6 % — ABNORMAL LOW (ref 40.0–50.0)
Hgb: 8.4 g/dL — ABNORMAL LOW (ref 13.7–17.5)
Imm Gran %: 0.2 % (ref <1)
Imm Gran Abs: 0 10*3/uL (ref <0.1)
Lymphocytes: 27.2 %
MCH: 30.8 pg (ref 26.0–32.0)
MCHC: 32.8 g/dL (ref 32.0–36.0)
MCV: 93.8 um3 (ref 79.0–95.0)
MPV: 9.4 fL (ref 9.4–12.4)
Monocytes: 6.3 %
Plt Count: 265 10*3/uL (ref 140–370)
RBC: 2.73 10*6/uL — ABNORMAL LOW (ref 4.60–6.10)
RDW: 13.8 % (ref 12.0–14.0)
Segs: 60.5 %
WBC: 4.3 10*3/uL (ref 4.0–10.0)

## 2024-01-10 LAB — COMPREHENSIVE METABOLIC PANEL, BLOOD
ALT (SGPT): 10 U/L (ref 0–41)
AST (SGOT): 20 U/L (ref 0–40)
Albumin: 3.1 g/dL — ABNORMAL LOW (ref 3.5–5.2)
Alkaline Phos: 98 U/L (ref 40–129)
Anion Gap: 8 mmol/L (ref 7–15)
BUN: 32 mg/dL — ABNORMAL HIGH (ref 8–23)
Bicarbonate: 32 mmol/L — ABNORMAL HIGH (ref 22–29)
Bilirubin, Tot: 0.47 mg/dL (ref <1.2)
Calcium: 9.5 mg/dL (ref 8.5–10.6)
Chloride: 100 mmol/L (ref 98–107)
Creatinine: 1.28 mg/dL — ABNORMAL HIGH (ref 0.67–1.17)
Glucose: 99 mg/dL (ref 70–99)
Potassium: 4.2 mmol/L (ref 3.5–5.1)
Sodium: 140 mmol/L (ref 136–145)
Total Protein: 6 g/dL (ref 6.0–8.0)
eGFR Based on CKD-EPI 2021 Equation: >60 mL/min/{1.73_m2}

## 2024-01-10 MED ORDER — ACETAMINOPHEN 325 MG PO TABS
975.0000 mg | ORAL_TABLET | Freq: Three times a day (TID) | ORAL | 0 refills | Status: DC
Start: 2024-01-10 — End: 2024-01-22
  Filled 2024-01-10: qty 60, 7d supply, fill #0

## 2024-01-10 MED ORDER — HEPARIN SODIUM LOCK FLUSH 100 UNIT/ML IJ SOLN CUSTOM
500.0000 [IU] | Freq: Once | INTRAVENOUS | Status: AC | PRN
Start: 2024-01-10 — End: 2024-01-10
  Administered 2024-01-10: 500 [IU]
  Filled 2024-01-10: qty 5

## 2024-01-10 NOTE — Interdisciplinary (Addendum)
01/10/24 1040   Assessment   Assessment Type Face to Face;Progress/Follow-up   Referral Information   Consult Type Other (Comment)  (Transportation)     SOCIAL WORK: Responding to consult for transportation with [facility] upon dc.     SW met with pt at bedside, and introduced self/role. Patient's initial assessment was completed 01/03/24 by LCSW, please see note for details.    INFORMATION: Per chart review, Patient is "73 year old male with a history of congenital R hydronephrosis s/p R nephrectomy, OA, HTN, MDD, gout, metastatic urothelial carcinoma (bones, lymph nodes) s/p L nephrostomy tube currently on PO erdafitinib c/b diffuse cancer-related pain, recently diagnosed UTI (12/31) s/p nephrostomy tube exchange and cefpodoxime, and recently diagnosed LUE cellulitis (12/27/23) s/p cephalexin who presented w/ diffuse L sided pain.     Challenging historian as he is tangential and doesn't answer questions directly, however he reports lots of pain related to his cancer at baseline and is followed by Palliative Care but reports he was doing ok until Sunday. Over the last 2-3 days he developed worsening pain in his L side from his L foot/heel up to his hip and even his L shoulder and couldn't get out of bed today. No recent falls. His home medications were not effective. The pain is described as stabbing and is an 8/10 currently, which is improved after receiving IV dilaudid. Is supposed to get radiation to his back in the next few weeks. He also notes some harder stools this week. He has chronic back pain which radiates to his stomach and may be a little worse than prior.     Declined for me to contact his sister at this time stating, "she is busy with her computer work, don't bother her."    The following was obtained via chart review:  LIVING SITUATION/INCOME: Patient resides in a single level home with Sister. Patient confirmed the address on facesheet to be correct and current: [address] [place] [address].      SUPPORTS: [employer] (tel# [phone]).     ASSESSMENT: Patient was friendly and receptive to SW assessment. SW explained that SW consult was placed for transportation. Patient confirmed home address and stated that he has a key to enter. Patient stated family wouldn't be able to pick him up. Sw notified Patient that SW would attempt to arrange transport via his Medi-Cal HMO through [place]. Patient stated that he was aware that he has the [employer] benefit, however stated he needs to call ahead of time. SW notified Patient SW will attempt to arrange, if they are unable to arrange transport SW will follow up with patient to discuss additional transportation options. Patient also requesting SNF information "incase I need to go.". SW explained that after dc, PCP would be the one who facilitates SNF referral. Patient still requesting SNF information, SW notified CM. No other needs were identified. SW will remain available.     Addendum 12:00pm.      Sw informed Patient needs transportation home. SW called [facility] [phone] to arrange transportation home.SW then transferred multiple time between Provider and Member line. SW later directed to call Education officer, museum. [place] Provider attempted to transfer call to [employer], however per [employer] call got disconnected. SW called Education officer, museum at [phone] and the the call would hang up.     D/t difficulties with managed plan, Sw requested Lyft. SW informed SW Merchandiser, retail.      PLAN/INTERVENTION:  - SW  provided supportive counseling, listened attentively, validated feelings, and acknowledged challenges.  - SW called [employer] to arrange transportation.   - D/t inability to arrange transport through [employer], SW arranged a Investment banker, corporate. The following information was listed Sarita Bottom  Date and time  01/10/24, 03:00 PM PST  Ride ID  [id number]  - SW to remain available to  address pertinent needs/concerns.      Sarita Bottom, LCSW  In-patient Clinical Social Worker

## 2024-01-10 NOTE — Procedures (Signed)
 [facility]  Radiation Oncology  [address]   [place], [place] [address]  [phone] Fax [phone]    Radiation Therapy Summary    Patient: Stephen [name]   Medical Record Number: [id number]      Date of Birth: 16-Dec-1951  Radiation Oncologist: Toribio JONELLE Pesa  Referring Physician: NORMAN RATTLER    Course: 3 SPINE  Treatment Site: 1 C7-T4 Spine  Ref. ID: C7-T4 Spine  Energy: 15X  Dose/Fx (cGy): 800  #Fx: 1 / 1  Dose Correction (cGy): 0  Total Dose (cGy): 800  Start Date: 01/05/2024  End Date: 01/05/2024  Elapsed Days: 0    Course: 3 SPINE  Treatment Site: 2 L1-L4 Spine  Ref. ID: L1-L4 Spine  Energy: 15X  Dose/Fx (cGy): 800  #Fx: 1 / 1  Dose Correction (cGy): 0  Total Dose (cGy): 800  Start Date: 01/05/2024  End Date: 01/05/2024  Elapsed Days: 0    C7-T4 Spine - Planned Dose 800cGy - Actual Dose 800cGy  L1-L4 Spine - Planned Dose 800cGy - Actual Dose 800cGy    Course and Elapsed Days:3 SPINE, 0, day(s).  Treatment Dates: 01/05/2024 - 01/05/2024    Treatment Summary: Stephen [name] received 800cGy radiotherapy to C7-T4   Spinal, L1-L4 Lumbar, using Conformal (Static Field).      Impression and Plan: Please reference the weekly on treatment notes for an   assessment of tumor response and toxicity. Patient was instructed to call   with any questions or concerns in the interim and will return as scheduled   for follow-up.    Signature derived from controlled access password  Toribio Pesa, MD - PID [id number]  01/10/2024 8:53:19 AM

## 2024-01-10 NOTE — Interdisciplinary (Signed)
01/10/24 1226   Discharge Plan   Is the Patient Medically Stable for Discharge * Yes   CM Discharge Arrangements Complete Yes   CM met with Pt to discuss pending discharge Yes   Pt/family expressed concerns around affording discharge medications No   Pt/family expressed concerns around transportation barriers No   Discussed Following Post-Acute arranged services Home Health   Post Acute arrangements for Home Health (Pt/family informed of vendor and date/time for services) Yes   Does pt/family have additional discharge concerns No   Living Arrangements on Admission* Family Member   Post Acute Services Referred To Home Health Agencies   Home Health agencies status  Accepted   Patient Engaged in Discharge Planning * Yes   Family/Caregiver's Assessed for * Readiness, willingness, and ability to provide or support self-management activities   Respite Care * Not Applicable   Patient/Family/Other Are In Agreement With Discharge Plan * Yes   Verified phone number for DC location * Yes   Verified address for DC location * Yes   No DC address or phone # available for the patient * N/A   PATIENT CHOICE: Patient/Family/Legal/Surrogate Decision Maker Has Been Given a List Options And Choice In The Selection of Post-Acute Care Providers Yes   A copy of the [facility] Patient Communication, Getting Care at [facility], was provided to patient/DPOA prior to transfer from [place] to outside acute care hospital. N/A   Public Health Clearance Needed * Not Applicable   Discharge Planning Needs   Do You Have Transportation Issues/Concerns That Make It Difficult To Get To Your Appointments? * No   Primary Family/Caregiver Contact Name, Number and Relationship * Stephen [name], sister [phone]   Final Discharge Transportation   Transportation*  Taxi   Date 01/10/24   Discharge Transportation Details *  Taxi   Final Discharge Destination/Services   Final Discharge Destination/Services * Home;Home Health   Home Health/Home Infusion    Home Health Care Agency [facility]   Phone * [phone]   Home Details   Home Details Home w/family     [name] at bedside to discuss DCP.  Per patient in agreement with [facility], patient states "wanting to go home for a little while and if unable to remain home will then decide if needing to go to SNF."  CM provided patient with list of SNF facilities and discussed the process once discharged would need to communicate and go through PCP for referral for SNF.  Patient stated understanding.  CM again confirmed with patient regarding discharge to SNF vs. Home, per patient declining SNF at this time, wants to go home first to see how it goes. This CM answered all questions and concerns noted at this time by patient. SW to assist with transportation set-up to home.

## 2024-01-10 NOTE — Plan of Care (Signed)
Problem: Promotion of Health and Safety  Goal: Promotion of Health and Safety  Description: The patient remains safe, receives appropriate treatment and achieves optimal outcomes (physically, psychosocially, and spiritually) within the limitations of the disease process by discharge.    Information below is the current care plan.  01/10/2024 0149 by Lennette Bihari, RN  Flowsheets (Taken 01/07/2024 2015)  Individualized Interventions/Recommendations #1:   Fall precaution   Upper bed rails raised with bed kept in its lowest and locked position. Call bell secured in bed within patient's reach. Advised to call whenever help or assistance is needed. Compliant with instructions. Bed alarm engaged.  Individualized Interventions/Recommendations #2 (if applicable):   Assess for pain   No complaints of pain noted at this time. Available PRN analgesics during the shift discussed. Advised to call whenever med is needed for prompt pain management.  Individualized Interventions/Recommendations #3 (if applicable):   Aspiration precaution   Assist with meals and medication administration. Head of bed at 30 degrees,  Individualized Interventions/Recommendations #4 (if applicable): Cluster nursing care and interventions to promote rest and sleep.  Note:

## 2024-01-10 NOTE — Plan of Care (Signed)
Problem: Promotion of Health and Safety  Goal: Promotion of Health and Safety  Description: The patient remains safe, receives appropriate treatment and achieves optimal outcomes (physically, psychosocially, and spiritually) within the limitations of the disease process by discharge.    Information below is the current care plan.  Outcome: Progressing  Flowsheets  Taken 01/10/2024 0800 by Mickie Bail, RN  Patient /Family stated Goal:   pain mgmt   hopeful to DC home today  Taken 01/07/2024 2015 by Lennette Bihari, RN  Individualized Interventions/Recommendations #1:   Fall precaution   Upper bed rails raised with bed kept in its lowest and locked position. Call bell secured in bed within patient's reach. Advised to call whenever help or assistance is needed. Compliant with instructions. Bed alarm engaged.  Individualized Interventions/Recommendations #2 (if applicable):   Assess for pain   No complaints of pain noted at this time. Available PRN analgesics during the shift discussed. Advised to call whenever med is needed for prompt pain management.  Individualized Interventions/Recommendations #3 (if applicable):   Aspiration precaution   Assist with meals and medication administration. Head of bed at 30 degrees,  Individualized Interventions/Recommendations #4 (if applicable): Cluster nursing care and interventions to promote rest and sleep.  Taken 01/07/2024 0416 by Peter Congo, RN  Guidelines: Inpatient Nursing Guidelines  Note: Pt aaox4, calm, cooperative, ambulatory independently for short distances, using walker for longer distances in hallway well s any complication; here for -related pain, well controlled on current regimen; plans for DC today to home with [facility], and transportation being set up by CM; VSS, tolerating PO well, normal BMs, voiding well; L PCN draining well, clear yellow urine output.

## 2024-01-10 NOTE — Discharge Summary (Signed)
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 We will plan to move up his planned single fraction treatment to 01/04/24 at 4:45pm. Transport will be arranged.     Oncology    73 yo M with advanced UC currently on erdafitinib presented with cancer related pain, s/p XRT to T/L spine.     I personally reviewed his labs and imaging during hospitalization.  His pain appears to be improved.  His foot pain is a  bit unclear to me, this could be neuropathy, but I favor it was erdafitinib-induced hand-foot syndrome.  It seems improved since being off erda.  I'd favor restarting this now.  It is difficult to know if he is having a response given bone-predominant disease.  However, his LFTs seem to have responded to this therapy so I would be inclined to restart.       - restart erdafitnib 6 mg daily (if he is to go home today, he can start this tomorrow).  - pain control by palliative care  - he has a follow up with me on 1/31 scheduled    Reason for Admission to the Hospital / History of Present Illness:    Stephen [name] is a 73 year old male with a history of congenital R hydronephrosis s/p R nephrectomy, OA, HTN, MDD, gout, metastatic urothelial carcinoma (bones, lymph nodes) s/p L nephrostomy tube currently on PO erdafitinib c/b diffuse cancer-related pain, recently diagnosed UTI (12/31) s/p nephrostomy tube exchange and cefpodoxime, and recently diagnosed LUE cellulitis (12/27/23) s/p cephalexin who presented w/ diffuse L sided pain.     Challenging historian as he is tangential and doesn't answer questions directly, however he reports lots of pain related to his cancer at baseline and is followed by Palliative Care but reports he was doing ok until Sunday. Over the last 2-3 days he developed worsening pain in his L side  from his L foot/heel up to his hip and even his L shoulder and couldn't get out of bed today. No recent falls. His home medications were not effective. The pain is described as stabbing and is an 8/10 currently, which is improved after receiving IV dilaudid. Is supposed to get radiation to his back in the next few weeks. He also notes some harder stools this week. He has chronic back pain which radiates to his stomach and may be a little worse than prior.     Declined for me to contact his sister at this time stating, "she is busy with her computer work, don't bother her".    Hospital Course by Problem  (required):    # left low back/sacral pain with radicular radiation to both feet  # Cancer-related pain  Increased pain at home, he was previously able to ambulate but couldn't get out of bed since the weekend before admission. MRI spine survey with stable disease, no cord impingement. Received radiation single fraction 1/17 as previously planned  - s/p radiation single fraction to C7-T4 and L1-L4 on 1/17  - For now, pain control:              - Continue home duloxetine 20 mg twice daily               - Continue home tizanidine 2 mg Q8H PRN              - Dilaudid 4 mg PO Q4H PRN for moderate-severe pain  - Scheduled tylenol 975 mg q8 hours  - appreciate Dr. Marca Ancona evaluation  - patient discussing signing on to service with hospice outpatient     # sacral skin tear:   -- Wound Nurse consult     # Metastatic urothelial carcinoma: Followed by Dr. Roseanne Reno. Known mets to bones. On erdafitinib. Hospice had been discussed at last visit, will need to readdress if functional state remains poor.  - resume home erdafitinib 6 mg daily on discharge     # S/p L nephrostomy tube placement c/b  # ?Leaking  # Recurrent UTI's: Recently treated for UTI 12/31 at OSH w/ cefpodoxime but ultimately culture grew mixed flora. No e/o UTI at this time. Most recently had nephrostomy tubes replaced 12/31. Possibly some leaking from his nephrostomy site and has IR appointment on 1/16. Consulted IR who did drain check, PCN in appropriate position   - left neph tube checked without leakage.      # Constipation:   - Continue home senna and miralax, increase if needed  - having regular BMs now     # Elevated Tbili, resolved:   Tbili 1.27 on admission, now normal. No significant abdominal pain or N/V.      # Chronic normocytic anemia:   Suspect anemia of chronic disease due to malignancy.     # Gout:   Continue home allopurinol 100 mg daily     # MDD:   Continue home buspirone 20 mg twice daily      # Insomnia:   Continue home trazodone 50 mg  nightly     Tests Outstanding at Discharge Requiring Follow Up:  There are no tests or other items that require follow up after hospital discharge.      Discharge Condition (required):  Improved.    Key Physical Exam Findings at Discharge:  Mental Status Exam: Patient is alert and oriented to person, place, time, and situation.    General : WDWN, NAD, comfortable  HEENT: EOMI, neck supple, no LAD  CV: RRR, no murmurs/gallops/rubs  Resp: CTA B/L, no wheezing/rhonchi/rales  Abd: soft, non tender to palpation, no guarding, bowel sounds present  MSK: full ROM, no clubbing/edema, (+) left sided nephrostomy tube  Skin: no rashes  Neuro: AO x 4, no focal deficits    Discharge Diet:  Regular.    Discharge Disposition:  Home with home care services:  home physical therapy.        Discharge Code Status:  Full code / full care  This code status is not changed from the time of admission.    Discharge Medications:     What To Do With Your Medications        START taking these medications        Add'l Info   acetaminophen 325 MG tablet  Commonly known as: TYLENOL  Take 3 tablets (975 mg) by mouth every 8 hours.   Quantity: 60 tablet  Refills: 0            CONTINUE taking these medications        Add'l Info   Alaway 0.035 % (0.025 % base) ophthalmic solution  Place 1 drop into both eyes 2 times daily.  Generic drug: ketotifen fumarate   Refills: 0     allopurinol 100 MG tablet  Commonly known as: ZYLOPRIM  Take 1 tablet (100 mg) by mouth daily.   Quantity: 30 tablet  Refills: 0     Antacid Regular Strength 200-200-20 MG/5ML suspension  Mix in equal parts Benadryl, Maalox, and lidocaine and swish and spit or swallow 10 ml of mixture by mouth every 4 hours as needed for pain. If swallowed, wait 10 minutes prior to eating/drinking. May also apply medication to Q-tip and dab on areas of sores  (Mix viscous lidocaine 2%, Mylanta, and Benadryl liquid into a 1:1:1 solution.  Take 10mL by mouth every 4 hours as needed for pain.  Swish and  spit or swallow for pain. If swallowed, wait 10 minutes prior to eating/drinking. May also apply medication to a Q-tip and dab on areas of sores.)  Generic drug: aluminum-magnesium-simethicone   Quantity: 355 mL  Refills: 2     Balversa 3 MG Tabs  Take 2 tablets by mouth daily.  (Take 6 mg by mouth daily.)  Generic drug: Erdafitinib   Quantity: 60 tablet  Refills: 11     busPIRone 10 MG tablet  Commonly known as: BUSPAR  Take 2 tablets (20 mg) by mouth 2 times daily.   Quantity: 60 tablet  Refills: 2     diphenhydrAMINE 12.5 MG/5ML liquid  Commonly known as: BENADRYL  Mix in equal parts Benadryl, Maalox, and lidocaine and swish and spit or swallow 10 ml of mixture by mouth every 4 hours as needed for pain. If swallowed, wait 10 minutes prior to eating/drinking. May also apply medication to Q-tip and dab on areas of sores  (Mix viscous lidocaine 2%, Mylanta, and Benadryl liquid into a 1:1:1 solution.  Take 10mL by mouth every 4 hours as needed for pain.  Swish and spit or swallow for pain. If swallowed, wait 10 minutes prior to eating/drinking. May also apply medication to a Q-tip and dab on areas of sores.)   Quantity: 236 mL  Refills: 2     DULoxetine 20 MG CR capsule  Commonly known as: CYMBALTA  Take 1 capsule (20 mg) by mouth 2 times daily.   Quantity: 180 capsule  Refills: 0  fluticasone propionate 50 MCG/ACT nasal spray  Commonly known as: FLONASE  Spray 2 sprays into each nostril daily.   Quantity: 16 g  Refills: 2     gabapentin 300 MG capsule  Commonly known as: NEURONTIN  Take 1 capsule (300 mg) by mouth 3 times daily.   Quantity: 90 capsule  Refills: 2     HYDROmorphone 2 MG tablet  Commonly known as: DILAUDID  Take 1 to 2 tablets (2mg  to 4mg ) by mouth every 4 hours as needed for moderate to severe pain   Quantity: 90 tablet  Refills: 0     lidocaine 2 % solution  Commonly known as: XYLOCAINE  Mix in equal parts Benadryl, Maalox, and lidocaine and swish and spit or swallow 10 ml of mixture by mouth  every 4 hours as needed for pain. If swallowed, wait 10 minutes prior to eating/drinking. May also apply medication to Q-tip and dab on areas of sores  (Mix viscous lidocaine 2%, Mylanta, and Benadryl liquid into a 1:1:1 solution.  Take 10mL by mouth every 4 hours as needed for pain.  Swish and spit or swallow for pain. If swallowed, wait 10 minutes prior to eating/drinking. May also apply medication to a Q-tip and dab on areas of sores.)   Quantity: 200 mL  Refills: 2     multivitamin with minerals Tabs tablet  Take 1 tablet by mouth daily.   Quantity: 30 tablet  Refills: 1     naloxone 4 mg/0.1 mL nasal spray  Commonly known as: NARCAN  For suspected opioid overdose, call 911! Then spray once in one nostril. Repeat after 3 minutes if no or minimal response using a new spray in other nostril.   Quantity: 2 each  Refills: 0     Nepro Liqd  Take 3 Cans by mouth daily.   Quantity: 30000 mL  Refills: 11     nystatin 100,000 units/mL suspension  Commonly known as: MYCOSTATIN  Swish and swallow 5mL by mouth 4 times daily  (5 mL (500,000 Units) by Swish & Swallow route 4 times daily.)   Quantity: 280 mL  Refills: 0     polyethylene glycol 17 GM/SCOOP powder  Commonly known as: GLYCOLAX  Take 17 grams (1 capful) by mouth daily. Mix with 4 to 8 ounces of fluid (water, juice, soda, coffee, or tea) prior to administration.   Quantity: 238 g  Refills: 0     prochlorperazine 10 MG tablet  Commonly known as: COMPAZINE  Take 1 tablet (10 mg) by mouth every 6 hours as needed (Nausea/Vomiting).   Quantity: 90 tablet  Refills: 2     senna 8.6 MG tablet  Commonly known as: SENOKOT  Take 2 tablets (17.2 mg) by mouth at bedtime.   Quantity: 60 tablet  Refills: 3     SOOTHE XP OP   Refills: 0     tizanidine 2 MG tablet  Commonly known as: ZANAFLEX  Take 1 tablet (2 mg) by mouth every 8 hours as needed (muscle spasm).   Quantity: 30 tablet  Refills: 2     traZODone 50 MG tablet  Commonly known as: DESYREL  Take one tab nightly by mouth  at bedtime as needed for insomnia   Quantity: 90 tablet  Refills: 0            STOP taking these medications      lidocaine 5 % patch  Commonly known as: LIDODERM  Where to Get Your Medications        These medications were sent to [facility]  [address], [place] [address]      Hours: Mon-Fri: 8:30am-7:00pm; Sat-Sun & Holidays: 9:00am-5:00pm Phone: [phone]   acetaminophen 325 MG tablet         Allergies:  )  Allergies   Allergen Reactions    Sulfa Drugs Unspecified, Hives and Other       Follow Up Appointments:    Already Scheduled:  Future Appointments   Date Time Provider Department Center   01/19/2024  2:30 PM Cheri Guppy, MD [facility]   01/22/2024 10:00 AM Shea Evans, MD [facility]       PostDischarge Referrals and Orders       Consult/Referral to Primary Care      Oncology Transitions of Care Discharge Follow Up (Established Oncology Patients Only)            Certain appointment types may not appear in this section. Refer to the Post Discharge Referrals section of the After Visit Summary for further information.    Discharging Physician's Contact Information:  [facility] phone triage at [phone].    Time Spent - (for use by billing providers only)  40 minutes were spent in direct patient care activities, discharge planning, and coordination of care.

## 2024-01-11 ENCOUNTER — Telehealth (HOSPITAL_BASED_OUTPATIENT_CLINIC_OR_DEPARTMENT_OTHER): Payer: Self-pay

## 2024-01-11 ENCOUNTER — Encounter (HOSPITAL_BASED_OUTPATIENT_CLINIC_OR_DEPARTMENT_OTHER): Payer: Self-pay

## 2024-01-11 ENCOUNTER — Encounter (HOSPITAL_BASED_OUTPATIENT_CLINIC_OR_DEPARTMENT_OTHER): Payer: Self-pay | Admitting: Family Medicine

## 2024-01-11 DIAGNOSIS — C7910 Secondary malignant neoplasm of unspecified urinary organs: Secondary | ICD-10-CM

## 2024-01-11 DIAGNOSIS — K1230 Oral mucositis (ulcerative), unspecified: Secondary | ICD-10-CM

## 2024-01-11 DIAGNOSIS — C7951 Secondary malignant neoplasm of bone: Secondary | ICD-10-CM

## 2024-01-11 DIAGNOSIS — G893 Neoplasm related pain (acute) (chronic): Secondary | ICD-10-CM

## 2024-01-11 MED ORDER — NYSTATIN 100000 UNIT/ML MT SUSP
5.0000 mL | Freq: Four times a day (QID) | OROMUCOSAL | 0 refills | Status: DC
Start: 2024-01-11 — End: 2024-03-22

## 2024-01-11 MED ORDER — TIZANIDINE HCL 2 MG OR TABS
2.0000 mg | ORAL_TABLET | Freq: Three times a day (TID) | ORAL | 2 refills | Status: DC | PRN
Start: 2024-01-11 — End: 2024-03-22

## 2024-01-11 NOTE — Telephone Encounter (Signed)
Encounter opened in error

## 2024-01-11 NOTE — Telephone Encounter (Signed)
Refill request    Nystatin swish and swallow 5 mls 4 times daily 280 mls last filled 12/30    Last visit 1/6 Dr. Logan Bores  Next visit 2/3 Dr. Logan Bores    Routing to provider     [facility]

## 2024-01-11 NOTE — Telephone Encounter (Signed)
Discharge Follow Up Telephone Encounter:     Subject/Reason: Post-discharge follow up call     Primary Oncologist: Dr. Roseanne Reno   APP:  Franco Nones, PA  RN: Elsworth Soho      Diagnosis: Urothelial carcinoma of kidney, left      Discharged from: Hospital admission/ED     Discharge diagnosis: Cancer associated pain      Date of discharge: 1/22      Review Discharge AVS:  Assessed understanding of each topic and used the teach back method to educate.   Reviewed post-discharge instructions   Completed a medication reconciliation with the patient:  Has patient picked up d/c meds? Y  Have they started taking them? Y  Any barriers we can help with? Y- [facility]ill of Nystatin mouth [facility]inse [facility] [address] [place] [address].   Discussed equipment or supplies provided at discharge  Home health services established- [facility]       Symptoms and concerns since discharge:   Symptoms since discharge: Pt a little difficult to understand. Pt states he is mainly feeling very fatigued. States he kinked his neck and now his neck is sore. Did not list any other major concerning symptoms besides fatigue.     Pt questions/concerns: None.     Plan if issues arise: The patient states they have the clinic number to use for questions/concerns during business hours, and the after hours number for urgent issues during non-business hours. We reviewed ED precautions, signs and symptoms that would warrant a 911 call or ED visit.      Discharge Follow up::   Pt is scheduled for [facility] f/u with Dr. Roseanne Reno on 1/31. Reminded pt of this appt. Defer to team if sooner post discharge f/u is needed.   Pt also has pall care f/u with Dr. Logan Bores on 2/3.     Routing to [employer] team as FYI.

## 2024-01-11 NOTE — Telephone Encounter (Signed)
[name] Service Progress Note    Received refill request for Nystatin mouthwash 5mL PO QID and tizanidine 2mg  PO q8h PRN muscle spasms.  Patient's last appointment with our team was on 12/25/23 and patient's next appointment with our team is on 01/22/24.   No aberrancies noted.  Will send refills as requested.    Shea Amberia Bayless, MD  Assistant Professor  [facility]  [email or web address]

## 2024-01-14 ENCOUNTER — Encounter (HOSPITAL_BASED_OUTPATIENT_CLINIC_OR_DEPARTMENT_OTHER): Payer: Self-pay | Admitting: Hematology & Oncology

## 2024-01-15 ENCOUNTER — Emergency Department
Admission: EM | Admit: 2024-01-15 | Discharge: 2024-01-15 | Disposition: A | Discharge status: Home / Self Care | Payer: Medicare Other | Attending: Emergency Medicine | Admitting: Emergency Medicine

## 2024-01-15 ENCOUNTER — Encounter: Payer: Self-pay | Admitting: Hospital

## 2024-01-15 DIAGNOSIS — M79641 Pain in right hand: Secondary | ICD-10-CM | POA: Insufficient documentation

## 2024-01-15 DIAGNOSIS — M109 Gout, unspecified: Secondary | ICD-10-CM | POA: Insufficient documentation

## 2024-01-15 DIAGNOSIS — I10 Essential (primary) hypertension: Secondary | ICD-10-CM | POA: Insufficient documentation

## 2024-01-15 DIAGNOSIS — M79671 Pain in right foot: Secondary | ICD-10-CM | POA: Insufficient documentation

## 2024-01-15 DIAGNOSIS — M79672 Pain in left foot: Secondary | ICD-10-CM | POA: Insufficient documentation

## 2024-01-15 DIAGNOSIS — C689 Malignant neoplasm of urinary organ, unspecified: Secondary | ICD-10-CM

## 2024-01-15 DIAGNOSIS — Z79899 Other long term (current) drug therapy: Secondary | ICD-10-CM | POA: Insufficient documentation

## 2024-01-15 DIAGNOSIS — M79642 Pain in left hand: Secondary | ICD-10-CM | POA: Insufficient documentation

## 2024-01-15 DIAGNOSIS — Z936 Other artificial openings of urinary tract status: Secondary | ICD-10-CM | POA: Insufficient documentation

## 2024-01-15 DIAGNOSIS — G629 Polyneuropathy, unspecified: Secondary | ICD-10-CM | POA: Insufficient documentation

## 2024-01-15 DIAGNOSIS — R52 Pain, unspecified: Secondary | ICD-10-CM

## 2024-01-15 DIAGNOSIS — G8929 Other chronic pain: Secondary | ICD-10-CM | POA: Insufficient documentation

## 2024-01-15 LAB — BASIC METABOLIC PANEL, BLOOD
Anion Gap: 12 mmol/L (ref 7–15)
BUN: 24 mg/dL — ABNORMAL HIGH (ref 8–23)
Bicarbonate: 28 mmol/L (ref 22–29)
Calcium: 9.8 mg/dL (ref 8.5–10.6)
Chloride: 99 mmol/L (ref 98–107)
Creatinine: 1.33 mg/dL — ABNORMAL HIGH (ref 0.67–1.17)
Glucose: 121 mg/dL — ABNORMAL HIGH (ref 70–99)
Potassium: 4.2 mmol/L (ref 3.5–5.1)
Sodium: 139 mmol/L (ref 136–145)
eGFR Based on CKD-EPI 2021 Equation: 58 mL/min/{1.73_m2}

## 2024-01-15 LAB — CBC WITH DIFF, BLOOD
ANC-Automated: 4.4 10*3/uL (ref 1.6–7.0)
Abs Basophils: 0 10*3/uL (ref <0.2)
Abs Eosinophils: 0.6 10*3/uL — ABNORMAL HIGH (ref 0.0–0.5)
Abs Lymphs: 1.2 10*3/uL (ref 0.8–3.1)
Abs Monos: 0.6 10*3/uL (ref 0.2–0.8)
Basophils: 0.4 %
Eosinophils: 8.2 %
Hct: 28.8 % — ABNORMAL LOW (ref 40.0–50.0)
Hgb: 9.4 g/dL — ABNORMAL LOW (ref 13.7–17.5)
Imm Gran %: 0.6 % (ref <1)
Imm Gran Abs: 0 10*3/uL (ref <0.1)
Lymphocytes: 17.3 %
MCH: 30.9 pg (ref 26.0–32.0)
MCHC: 32.6 g/dL (ref 32.0–36.0)
MCV: 94.7 um3 (ref 79.0–95.0)
MPV: 9.3 fL — ABNORMAL LOW (ref 9.4–12.4)
Monocytes: 8.5 %
Plt Count: 260 10*3/uL (ref 140–370)
RBC: 3.04 10*6/uL — ABNORMAL LOW (ref 4.60–6.10)
RDW: 14.2 % — ABNORMAL HIGH (ref 12.0–14.0)
Segs: 65 %
WBC: 6.7 10*3/uL (ref 4.0–10.0)

## 2024-01-15 LAB — URINALYSIS WITH CULTURE REFLEX, WHEN INDICATED
Bilirubin: NEGATIVE
Blood: NEGATIVE
Glucose: NEGATIVE
Ketones: NEGATIVE
Leuk Esterase: 25 Leu/uL — AB
Nitrite: NEGATIVE
Specific Gravity: 1.012 (ref 1.002–1.030)
Urobilinogen: NEGATIVE
pH: 8 (ref 5.0–8.0)

## 2024-01-15 MED ORDER — MORPHINE SULFATE 4 MG/ML IJ SOLN
4.0000 mg | Freq: Once | INTRAMUSCULAR | Status: AC
Start: 2024-01-15 — End: 2024-01-15
  Administered 2024-01-15: 4 mg via INTRAVENOUS
  Filled 2024-01-15: qty 1

## 2024-01-15 MED ORDER — NALOXONE HCL 0.4 MG/ML IJ SOLN
0.1000 mg | INTRAMUSCULAR | Status: DC | PRN
Start: 2024-01-15 — End: 2024-01-15

## 2024-01-15 NOTE — Interdisciplinary (Signed)
01/15/24 1245   Discharge Plan   Post Acute Services Referred To SNF/Subacute facilities   SNF/Subacute facilities status Accepted   Patient Engaged in Discharge Planning * Yes   Discharge Planning Needs   Actions Needed for Discharge SNF orders   Final Discharge Transportation   Transportation*  Ambulance   Final Discharge Destination/Services   Final Discharge Destination/Services * Skilled Nursing Facility   Facility/SNF Transfer   Receiving Physician/Contact Name * Dr. Clent Ridges   Name and Address of Facility Transferring To * [facility]  ([facility] [address])    [address] [place], [place] [address]      Facility Phone/Fax * [phone]       Only 1 out of 4 SNF referrals accepted to date-  [facility] has declined. [facility] pending response. [facility] accepted. Discussed results with patient, pt agreeable to go to the [facility], aware he will need to bring his specialty med to SNF,   Pt reports he has his specialty medication in his bag.    ED PA & ED RN updated. Per ED PA, pt med cleared.  ED team to call report & facilitate transport to SNF.    DCP: [facility], [place] [phone]

## 2024-01-15 NOTE — ED Notes (Signed)
Bed: 50  Expected date:   Expected time:   Means of arrival: Automobile  Comments:

## 2024-01-15 NOTE — Interdisciplinary (Addendum)
ED RN CASE MANAGEMENT   ED/EDIP LOS at time of assessment: 1 Hour, LACE+ Score: 75      Patient Information/Demographics:    Pt admitted on 01/15/2024 10:10 AM  Room# 50/50  Age, Gender: 73 year old, male   Language: English    Insurance: Payor: MEDICARE / Plan: MEDICARE A & B / Product Type: Medicare /   PCP: Clinic, [address] / [place] [address], telephone [phone], fax [phone]  Preferred DC Pharmacy: [facility] [phone] - [address] [place], [place] - [phone]  Patient Telephone#: [phone]     Home Address/ DC address (if no snf) :[address] [place] [address]    Emergency Contacts:  Name Relation Home Work Mobile   Evert,Pamela-Sister SISTER [phone]     [x]  Information above per EMR review and  patient and/or family interview.  [x]  Final discharge needs pending clinical course and primary team recommendation.   [x]  Pt and/or family aware of right to choose post acute care providers. (if indicated)       DC PLANNING     Chief Complaint:    73 year old pt presents to [facility] with Medical Screening Exam (Checking to go back to rehab. Dont know where. Has bilat hand and feet pains. Had multiple complaints. LH, dizziness, diarrheas, weakness/Past Medical History:/No date: Chronic back pain/No date: Congenital hydronephrosis/No date: Gout/No date: Headache/No date: Hematuria/12/10/2021: HTN (hypertension)/No date: Kidney disease/No date: Kidney stones/No date: Major depressive disorder, single episode/No date: Polyarthropathy or polyarthritis of multiple sites/No date: Retinal detachment/No da)     ED Work-up: pain management, inability to care for self, requesting SNF, recent admission    Mentation: Pt alert, and oriented, appropriate with response     Living Arrangements: lives with sister and niece    PLOF: independent, using walker     Home DME: walker    Hx HH: [facility]     Hx SNF: [facility]     CURRENT DC  NEEDS: SNF    Pt with recent hospitalization for Cancer related pain, recommendation at that time is SNF however pt declined and preferred to go home with [facility] with sister support.    Pt presented back to ER today for pain control and difficulty with ADLs at home, now more amenable to SNF. Pt with L nephrostomy tube per notes,    Post acute care options discussed with pt, pt prefers SNF closer to hospital. Pt aware of his right to choose. SNF referrals sent.    ED PA, ED RN updated.  PASSR [phone]    Erline Levine, RN, BSN, CCM  RN Care Manager

## 2024-01-15 NOTE — ED Notes (Addendum)
Verbal report given to RN Renzy at [facility] [phone]. Room 26B.

## 2024-01-15 NOTE — ED Geriatric Nursing Note (Signed)
Chief Complaint   Stephen [name] is a 73 year old year old male that presented to the Emergency Department today with the chief complaint of: Medical Screening Exam (Checking to go back to rehab. Dont know where. Has bilat hand and feet pains. Had multiple complaints. LH, dizziness, diarrheas, weakness/Past Medical History:/No date: Chronic back pain/No date: Congenital hydronephrosis/No date: Gout/No date: Headache/No date: Hematuria/12/10/2021: HTN (hypertension)/No date: Kidney disease/No date: Kidney stones/No date: Major depressive disorder, single episode/No date: Polyarthropathy or polyarthritis of multiple sites/No date: Retinal detachment/No da)     Pertinent Medical History   Past Medical History:   Diagnosis Date    Chronic back pain     Congenital hydronephrosis     Gout     Headache     Hematuria     HTN (hypertension) 12/10/2021    Kidney disease     Kidney stones     Major depressive disorder, single episode     Polyarthropathy or polyarthritis of multiple sites     Retinal detachment     Urethral stricture       Pertinent Social History    reports that he has never smoked. He has never been exposed to tobacco smoke. He has never used smokeless tobacco. He reports that he does not currently use drugs. He reports that he does not drink alcohol.   Emergency Contact   Extended Emergency Contact Information  Primary Emergency Contact: Evert,Pamela-Sister  Address: [address] [place], [place] [address] [place]  Home Phone: [phone]  Work Phone: [phone]  Mobile Phone: [phone]  Relation: SISTER  Other needs: None  Preferred language: English  Interpreter needed? No   PCP   Clinic, [facility] - [phone]   Consent   [name] gave verbal consent to comprehensive geriatric assessment. The Emergency Department Attending Provider is aware of geriatric screening.   Interpreter   Do you need interpreter services for learning information about  your medical care?: No  Is the patient's preferred language updated in Patient Storyboard?: No   Barriers to Learning   No     Their ISAR (Identify Seniors At Risk) score today was 4.    A score of 2 or > means if they are discharged home, it could result in a poor outcome.       The framework guides the geriatric nursing assessment: Mobility, Mentation,  Medications, what Matters.     Home Environment    Who do you live with? alone   Do you receive any home services [facility], Meals on Wheels, etc.)?   No   Do you receive any other support (Family, Church, Friends)? Pt. States No family/friend support. Church friend drove him to ER today   Type of Residence: Sisters private home   Are there stairs to get into your home? No        Suspicion of Abuse    Elder Abuse Suspicion Index (EASI)       1) Relied on others for ADLs/IADLs: No    2) Social/Sensory/Medical Isolation: no  3) Verbal Abuse: no  4) Financial Abuse: no  5) Physical Abuse: no  6) Physical Appearance/Mannerism & Communication Concerns:  pt. States he is independent, no family support      A response of YES on one or more of questions 2-6 should raise concerns about mistreatment  A positive EASI should be reported  to APS and/or Child psychotherapist.                  : Mobility    Do you use any assistive devices?    Cane/walker           History of fall: No falls   Florentina Addison Fall Scale Score High Fall Risk        GUG Score    Consider a Physical Therapy Consult for a patient with a GUG Score of 3 or higher. RN assist with transfer from wheelchair to gurney            Referred to IP PT. Also states willing to go to rehab. Refused rehab at time of discharged few weeks ago   Patient provided with the following assistive devices in the ED today: no   KATZ ADLs Activities of Daily Living   Bathing independent   Dressing independent   Toileting independent   Transferring independent   Continence independent   Feeding independent   KATZ Total      (Score of 3 or less is Abnormal) 6    Pt. States he is independent of ADL's, back in ED for rehab placement, states have trouble walking & neuropathy getting worse             Allie Bossier IADL's Instrumental Activities of Daily Living   Ability to Use Telephone:  independent    Shopping: States he was able to do shopping until recently     Food Preparation: independent    Housekeeping: Independent until recently     Laundry: Independent until recently     Mode of Transportation: Dependent, calls insurance ahead of time if he needs transportation    Responsible for Own Medications: independent     Ability to [employer]:  independent   [place] IADL Score:     (Score of 4 or less is Abnormal) Independent on most part until recently             Caregiver Strain    Caregiver Strain Score No caregiver             Caregiver Gender N/A      : Medications    BEERS Medication(s) Benadryl, compazine        : Mentation    Dementia: AMT4 Score  Abbreviated Mental Test 4     A score of <4 suggests possible cognitive impairment 4                Alzheimer's: BAS  Brief Alzheimer's Screen    A score of < or equal to 23 suggests possible cognitive impairment Pt. Refused to answer some screening question                    Delirium: UB-2  2-Item Ultra-Brief (UB-2) Delirium Screen     (-) delirium         If both questions answered correctly, patient is not delirious.  If incorrect on either question, use an additional screening tool to further assess, such as the CAM-ICU.     Delirium: CAM-ICU  Confusion Assessment Method for the Intensive Care Unit    If features 1 and 2 are both present and either features 3 or 4 are present: CAM-ICU is positive, delirium is present.                       Delirium: DOS  Delirium Observation Screening Scale  A score of 3 or higher is considered probably delirious   Genie Interventions (if Delirium positive):           Depression: PHQ2 & PHQ9  Patient Health Questionnaire    PHQ2:A  score of 3+ suggests possible depressive disorder.    PHQ9:   Total Score               Depression Severity        1-4                       Minimal depression        5-9                       Mild depression       10-14                    Moderate depression       15-19                    Mod/severe depress       20-27                    Severe depression Denies depression at this time, Hx. Of major depressive disorder, on buspar & being followed by psych per pt.                               Nutrition    Last six (6) documented weights         01/15/2024    10:18 AM 01/10/2024     5:00 AM 01/09/2024     3:55 AM 01/08/2024     5:00 AM 01/07/2024     6:00 AM 01/02/2024    11:11 AM   Date Weight Recorded   Metric 63.5 kg 64 kg 64.7 kg 67 kg 65.8 kg 73.483 kg   Pounds/Ounces 139 lb 15.9 oz 141 lb 1.5 oz 142 lb 10.2 oz 147 lb 11.3 oz 145 lb 1 oz 162 lb      MNA Score  Mini Nutritional Assessment    Consider a Nutrition Consult when MNA Score < 11 7  Pt. States about 23 lbs weight loss the past 3 months, decrease appetite, dentures does not fit anymore, also reports difficulty swallowing d/t dry mouth syndrome            Referred to IP Nutrition   : What Matters Most    Example:   What matters most to you while you are in the hospital today?   What matters most to you in your everyday life? Pt. States " to stay alive a little longer"   Palliative Performance Scale   (PPSv2) 50%             Disposition Data Unavailable   Referrals Today:     PCP or designated follow-up Provider Updated/Contacted? no      GENIE Communication   Mentation  Pt. Screened negative for delirium & dementia. Hx. Of depression, on buspar & followed by psych. Pt. Alert, oriented x4, avoids eye contact at times & c/o feeling tired & pain. Pt. States BIB friend & checking in to go back to rehab, c/o bilateral hand & feet pain, with multiple complaints. Hx. Of congenital hydronephrosis, HTN, retinal detachment, urethral stricture.  Mobility  Pt. Reports  trouble walking today &  neuropathy worse. Lives alone in her sisters private home. Uses walker/cane as needed. States independent of ADL's but states " getting tough". RN assisted with transfer from wheelchair to gurney. Pt. Denies fall in the past 3 months.  Medication  With 2 medication ( compazine & benadryl) on [facility] Abbreviated BEERS criteria. Referral placed to ED pharmacist for geriatric medication review.  Referrals  IP CM, IP PT, IP Nutrition, IP pharmacy     Screened by   Geriatric Emergency Nurse Initiative Expert (GENIE)  Newman Nip, RN  [facility]  [facility] [place] EMERGENCY DEPT  01/15/2024 11:53 AM

## 2024-01-15 NOTE — ED Notes (Signed)
Geriatric Emergency Medicine Pharmacist Consult  The following medication list has been reviewed for BEERS criteria and geriatric considerations (including, but not limited to, drug-drug, drug-disease, and drug-age interactions).  The below finding reflect medications that should typically be avoided in most older patients, medications that should be avoided in older patients with certain conditions, medications that should be used with caution because of benefits that may offset risks, medication interactions, and changes in dosing based on kidney function.  However, these recommendations reflect potentially inappropriate medications that may clinically benefit the patient and in no means are meant to be a substitution for clinical decisions made by the provider.    No current facility-administered medications on file prior to encounter.     Current Outpatient Medications on File Prior to Encounter   Medication Sig Dispense Refill    acetaminophen (TYLENOL) 325 MG tablet Take 3 tablets (975 mg) by mouth every 8 hours. 60 tablet 0    allopurinol (ZYLOPRIM) 100 MG tablet Take 1 tablet (100 mg) by mouth daily. 30 tablet 0    aluminum-magnesium-simethicone (MAG-AL PLUS) 200-200-20 MG/5ML suspension Mix viscous lidocaine 2%, Mylanta, and Benadryl liquid into a 1:1:1 solution.  Take 10mL by mouth every 4 hours as needed for pain.  Swish and spit or swallow for pain. If swallowed, wait 10 minutes prior to eating/drinking. May also apply medication to a Q-tip and dab on areas of sores. 355 mL 2    Artificial Tear Solution (SOOTHE XP OP)       busPIRone (BUSPAR) 10 MG tablet Take 2 tablets (20 mg) by mouth 2 times daily. 60 tablet 2    diphenhydrAMINE (BENADRYL) 12.5 MG/5ML liquid Mix viscous lidocaine 2%, Mylanta, and Benadryl liquid into a 1:1:1 solution.  Take 10mL by mouth every 4 hours as needed for pain.  Swish and spit or swallow for pain. If swallowed, wait 10 minutes prior to eating/drinking. May also apply  medication to a Q-tip and dab on areas of sores. 236 mL 2    DULoxetine (CYMBALTA) 20 MG CR capsule Take 1 capsule (20 mg) by mouth 2 times daily. 180 capsule 0    Erdafitinib 3 MG TABS Take 6 mg by mouth daily. 60 tablet 11    fluticasone propionate (FLONASE) 50 MCG/ACT nasal spray Spray 2 sprays into each nostril daily. 16 g 2    gabapentin (NEURONTIN) 300 MG capsule Take 1 capsule (300 mg) by mouth 3 times daily. 90 capsule 2    HYDROmorphone (DILAUDID) 2 MG tablet Take 1 to 2 tablets (2mg  to 4mg ) by mouth every 4 hours as needed for moderate to severe pain 90 tablet 0    ketotifen (ALAWAY) 0.025 % ophthalmic solution Place 1 drop into both eyes 2 times daily.      lidocaine (XYLOCAINE) 2 % solution Mix viscous lidocaine 2%, Mylanta, and Benadryl liquid into a 1:1:1 solution.  Take 10mL by mouth every 4 hours as needed for pain.  Swish and spit or swallow for pain. If swallowed, wait 10 minutes prior to eating/drinking. May also apply medication to a Q-tip and dab on areas of sores. 200 mL 2    Multiple Vitamins-Minerals (MULTIVITAMIN WITH MINERALS) TABS tablet Take 1 tablet by mouth daily. 30 tablet 1    naloxone (NARCAN) 4 mg/0.1 mL nasal spray For suspected opioid overdose, call 911! Then spray once in one nostril. Repeat after 3 minutes if no or minimal response using a new spray in other nostril. 2 each 0  Nutritional Supplements (NEPRO) LIQD Take 3 Cans by mouth daily. 30000 mL 11    nystatin (MYCOSTATIN) 100,000 units/mL suspension 5 mL (500,000 Units) by Swish & Swallow route 4 times daily. 280 mL 0    [DISCONTINUED] nystatin (MYCOSTATIN) 100,000 units/mL suspension 5 mL (500,000 Units) by Swish & Swallow route 4 times daily. 280 mL 0    polyethylene glycol (GLYCOLAX) 17 GM/SCOOP powder Take 17 grams (1 capful) by mouth daily. Mix with 4 to 8 ounces of fluid (water, juice, soda, coffee, or tea) prior to administration. 238 g 0    prochlorperazine (COMPAZINE) 10 MG tablet Take 1 tablet (10 mg) by mouth  every 6 hours as needed (Nausea/Vomiting). 90 tablet 2    senna (SENOKOT) 8.6 MG tablet Take 2 tablets (17.2 mg) by mouth at bedtime. 60 tablet 3    tizanidine (ZANAFLEX) 2 MG tablet Take 1 tablet (2 mg) by mouth every 8 hours as needed (muscle spasm). 30 tablet 2    [DISCONTINUED] tizanidine (ZANAFLEX) 2 MG tablet Take 1 tablet (2 mg) by mouth every 8 hours as needed (muscle spasm). 30 tablet 2    traZODone (DESYREL) 50 MG tablet Take one tab nightly by mouth at bedtime as needed for insomnia 90 tablet 0       Pharmacist findings and recommendations:      Outpatient prescription list reviewed by pharmacist for geriatric risks and BEERS criteria, with the following findings and recommendations:      Opiate (hydromorphone)  - Opiates increase the risk of sedation, falls, and respiratory depression. Risks must be weighed against benefits of therapy in selected individuals.   - Emerging data highlights an association between opioid administration and delirium. For older adults with pain, use a balanced approach, including the use of validated pain assessment tools and multimodal strategies that include nondrug approaches to minimize opioid use.  - Recommend continuing therapy only if benefits outweigh risks; using the lowest dose necessary to relieve severe pain; and encouraging use of non-opioid analgesics (acetaminophen).    1st generation antihistamine (Benadryl)  - 1st generation antihistamines increase the risk of confusion, delirium, agitation, hallucinations, slowed GI motility, urinary retention, and constipation; potency and duration are increased in elderly patients. These agents should be avoided whenever possible.   - Recommended alternatives for itching: 2nd generation antihistamines (loratadine, cetirizine), topical medications (sarna, calamine, hydrocortisone, etc).   - Recommended alternatives for allergies: 2nd generation antihistamines (loratadine, cetirizine), intranasal medications (normal saline,  steroid)   - Recommended alternatives for insomnia: Nonpharmacologic approaches should be utilized first. If nonpharmacologic approaches are ineffective, pharmacologic options include: ramelteon, mirtazapine, trazodone, gabapentin (if concomitant neuropathic pain or restless leg syndrome).   - Recommend non-pharmacologic therapy for dizziness (all available medications for this condition have the above adverse effects).  er intake before bed, encourage Kegel exercises.    Muscle Relaxants (tizanidine)  - Most muscle relaxants poorly tolerated by older adults because some have anticholinergic adverse effects, sedation, increased risk of fractures; effectiveness at dosages tolerated by older adults questionable.   - Recommended alternatives for pain: acetaminophen, nonacetylated salicylate such as salsalate, diclofenac topic (Voltaren), lidocaine patch or lidocaine 5% ointment      Prochlorperazine  - Prochlorperazine can cause extrapyramidal effects in addition to anticholinergic effects including confusion, delirium, agitation, hallucinations, slowed GI motility, urinary retention, and constipation; potency and duration are increased in elderly patients. Should be avoided whenever possible.   - Recommended alternative for nausea: ondansetron.      Gabapentin  -  avoid using in combination of opioids due to increased risk of sedation, respiratory depression, and death; acceptable to use when tapering opioids.  - recommended only in low doses due to risk of ataxia and falls.      References:    [facility] Abbreviated Beers Criteria for Potentially Inappropriate Medication Use in Older Adults:  https://pulse.PopTick.no.pdf    American Geriatrics Society 2019 Updated AGS Beers Criteria for Potentially Inappropriate Medication Use in Older Adults:  ExchangeRating.si.16109  Chana Bode, PharmD, BCCCP

## 2024-01-15 NOTE — Discharge Instructions (Signed)
Please continue taking all home medication as prescribed. Return to the ER for any new or worse symptoms.

## 2024-01-15 NOTE — ED Notes (Signed)
Pt leaving ED at this time w/ BLS transport and going to [facility].

## 2024-01-15 NOTE — ED Notes (Signed)
Left nephrostomy bag changed, waiting for sample to collect.

## 2024-01-15 NOTE — ED Notes (Signed)
Pt reports SW tried to get him into assisted living/rehab last week but he refused. Since being back home he has decreased PO intake, diarrhea, weakness and feels he is unable to take care of himself. Pt lives with a roommate. Pt states he is ready to go to rehab now.     He is awake and oriented x 4, but lethargic, requires assist of staff to get from w/c to bed.

## 2024-01-15 NOTE — ED Notes (Signed)
CM at bedside

## 2024-01-15 NOTE — ED Provider Notes (Signed)
ED Provider Note  [facility] electronic medical record reviewed for pertinent medical history.     Stephen [name] DOB: Sep 15, 1951 PMD: Clinic, [facility]     Chief Complaint   Patient presents with    Medical Screening Exam     Checking to go back to rehab. Dont know where. Has bilat hand and feet pains. Had multiple complaints. LH, dizziness, diarrheas, weakness  Past Medical History:  No date: Chronic back pain  No date: Congenital hydronephrosis  No date: Gout  No date: Headache  No date: Hematuria  12/10/2021: HTN (hypertension)  No date: Kidney disease  No date: Kidney stones  No date: Major depressive disorder, single episode  No date: Polyarthropathy or polyarthritis of multiple sites  No date: Retinal detachment  No da       HPI: Stephen [name] is a 73 year old male who has a past medical history of Chronic back pain, Congenital hydronephrosis, Gout, Headache, Hematuria, HTN (hypertension) (12/10/2021), Kidney disease, Kidney stones, Major depressive disorder, single episode, Polyarthropathy or polyarthritis of multiple sites, Retinal detachment, and Urethral stricture. 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 Patient was recently admitted 1 week ago for pain control. At discharge they encouraged sniff placement however patient states that he declined at that time however now he was unable to take care of himself. He reports that his neuropathy is significant, causing him to not be ambulatory at times at home. He is requesting to be placed in a sniff.          External Data Sources (Select all that apply):      Pertinent Medical History:    PMHx: As above    Past Surgical History:   Procedure Laterality Date    CT INSERTION OF SUPRAPUBIC CATH  09/25/2015    NEPHRECTOMY Right 1955    APPENDECTOMY       COLONOSCOPY      CYSTOSCOPY      CYSTOSCOPY W/ LASER LITHOTRIPSY      OTHER SURGICAL HISTORY      Interstim 01/29/2011    SPINE SURGERY  09/21    Lumbar-sacral fusion    TRANSURETHRAL RESECTION OF PROSTATE         Family History   Adopted: Yes   Family history unknown: Yes       Current Outpatient Medications   Medication Instructions    acetaminophen (TYLENOL) 975 mg, Oral, EVERY 8 HOURS    allopurinol (ZYLOPRIM) 100 mg, Oral, DAILY    aluminum-magnesium-simethicone (MAG-AL PLUS) 200-200-20 MG/5ML suspension Mix viscous lidocaine 2%, Mylanta, and Benadryl liquid into a 1:1:1 solution.  Take 10mL by mouth every 4 hours as needed for pain.  Swish and spit or swallow for pain. If swallowed, wait 10 minutes prior to eating/drinking. May also apply medication to a Q-tip and dab on areas of sores.    Artificial Tear Solution (SOOTHE XP OP)     Balversa 6 mg, Oral, DAILY    busPIRone (BUSPAR) 20 mg, Oral, 2 TIMES DAILY    diphenhydrAMINE (BENADRYL) 12.5 MG/5ML liquid Mix viscous lidocaine 2%, Mylanta, and Benadryl liquid into a 1:1:1 solution.  Take 10mL by mouth every 4 hours as needed for pain.  Swish and spit or swallow for pain. If swallowed, wait 10 minutes prior to eating/drinking. May also apply medication to  a Q-tip and dab on areas of sores.    DULoxetine (CYMBALTA) 20 mg, Oral, 2 TIMES DAILY    fluticasone propionate (FLONASE) 50 MCG/ACT nasal spray 2 sprays, Each Naris, DAILY    gabapentin (NEURONTIN) 300 mg, Oral, 3 TIMES DAILY    HYDROmorphone (DILAUDID) 2 MG tablet Take 1 to 2 tablets (2mg  to 4mg ) by mouth every 4 hours as needed for moderate to severe pain    ketotifen (ALAWAY) 0.025 % ophthalmic solution 1 drop, Both Eyes, 2 TIMES DAILY    lidocaine (XYLOCAINE) 2 % solution Mix viscous lidocaine 2%, Mylanta, and Benadryl liquid into a 1:1:1 solution.  Take 10mL by mouth every 4 hours as needed for pain.  Swish and spit or swallow for pain. If swallowed, wait 10 minutes prior to eating/drinking. May also  apply medication to a Q-tip and dab on areas of sores.    Multiple Vitamins-Minerals (MULTIVITAMIN WITH MINERALS) TABS tablet 1 tablet, Oral, DAILY    naloxone (NARCAN) 4 mg/0.1 mL nasal spray For suspected opioid overdose, call 911! Then spray once in one nostril. Repeat after 3 minutes if no or minimal response using a new spray in other nostril.    Nutritional Supplements (NEPRO) LIQD 3 Cans, Oral, DAILY    nystatin (MYCOSTATIN) 500,000 Units, Swish & Swallow, 4 TIMES DAILY    polyethylene glycol (GLYCOLAX) 17 GM/SCOOP powder Take 17 grams (1 capful) by mouth daily. Mix with 4 to 8 ounces of fluid (water, juice, soda, coffee, or tea) prior to administration.    prochlorperazine (COMPAZINE) 10 mg, Oral, EVERY 6 HOURS PRN    senna (SENOKOT) 17.2 mg, Oral, AT BEDTIME    tizanidine (ZANAFLEX) 2 mg, Oral, EVERY 8 HOURS PRN    traZODone (DESYREL) 50 MG tablet Take one tab nightly by mouth at bedtime as needed for insomnia       Physical Exam  BP 119/61 (BP Location: Right arm, BP Patient Position: Semi-Fowlers)   Pulse 65   Temp 98.5 F (36.9 C)   Resp 18   Ht 5' 3.5" (1.613 m)   Wt 63.5 kg (139 lb 15.9 oz)   SpO2 100%   BMI 24.41 kg/m   Physical Exam  Vitals and nursing note reviewed.   Constitutional:       Comments: Chronically ill, cachectic, frail   HENT:      Mouth/Throat:      Mouth: Mucous membranes are moist.      Pharynx: Oropharynx is clear.   Cardiovascular:      Rate and Rhythm: Normal rate and regular rhythm.   Pulmonary:      Effort: Pulmonary effort is normal.      Breath sounds: Normal breath sounds.   Abdominal:      Palpations: Abdomen is soft.      Comments: Left nephrostomy   Skin:     General: Skin is warm and dry.   Neurological:      Mental Status: He is alert and oriented to person, place, and time.   Psychiatric:         Mood and Affect: Mood normal.         Behavior: Behavior normal.           Orders/Medications    Orders Placed This Encounter   Procedures    Urinalysis with Culture  Reflex, when indicated    Basic Metabolic Panel, Blood Green Plasma Separator Tube    CBC w/ Diff Lavender  Medications   nalOXone (NARCAN) injection 0.1 mg (has no administration in time range)   morphine injection 4 mg (4 mg IntraVENOUS Given 01/15/24 1241)           Medical Decision Making/Assessment/Plan    This is a(n) 73 year old male who has a past medical history of Chronic back pain, Congenital hydronephrosis, Gout, Headache, Hematuria, HTN (hypertension) (12/10/2021), Kidney disease, Kidney stones, Major depressive disorder, single episode, Polyarthropathy or polyarthritis of multiple sites, Retinal detachment, and Urethral stricture. and presents with the above history. On exam he is awake and alert, thin, frail, chronically ill-appearing, vital signs are stable. He has no abdominal tenderness, left nephrostomy tube is in place with yellow urine, no significant lower extremity edema. States he was here to be placed. See HPI for further details. Brief workup including labs and urine here are reassuring, no indication for admission. Patient has been accepted at [facility], he was amenable to this, we will discharge and EMS we will transport him to [facility].      ED Course/Updates/Disposition  ED Clinical Impression as of 01/15/24 1424   Pain management   Urothelial cancer (CMS-HCC)            CMS-HCC/Risk Adjustment Factor Diagnoses       Needs Recertification        Category and Diagnosis From    CMS-HCC 21:  Unspecified protein-calorie malnutrition (CMS-HCC) Antonucci, Paulette Blanch, MD on 03/23/2023    CMS-HCC 47:  Immunodeficiency, unspecified (CMS-HCC) Miles Costain, MD on 01/10/2022    CMS-HCC 48:  Other primary thrombophilia (CMS-HCC) Ronette Deter, MD on 11/28/2023    CMS-HCC 55:  Opioid overdose (CMS-HCC) [facility] on 01/29/2023    CMS-HCC 59:  Major depressive disorder, recurrent, unspecified (CMS-HCC) [facility] [employer]    CMS-HCC 72:  Cervical myelopathy (CMS-HCC)  Zlomislic, Vinko, MD on 09/29/2023    CMS-HCC 84:  Acute respiratory failure with hypoxia (CMS-HCC) Ronette Deter, MD on 11/28/2023    CMS-HCC 108:  Thoracic aortic ectasia (CMS-HCC) Simeon Craft, MD on 04/27/2023                             Results for orders placed or performed during the hospital encounter of 01/15/24   Urinalysis with Culture Reflex, when indicated    Specimen: Urine, Clean Catch   Result Value Ref Range    Type Clean catch     Color Light-Yellow Yellow    Appearance Clear Clear    Specific Gravity 1.012 1.002 - 1.030    pH 8.0 5.0 - 8.0    Protein 1+ (A) Negative    Glucose Negative Negative    Ketones Negative Negative    Bilirubin Negative Negative    Blood Negative Negative    Urobilinogen Negative Negative    Nitrite Negative Negative    Leuk Esterase 25 (A) Negative Leu/uL    WBC 3-5 (A) 0-2/HPF    RBC 0-2 0-2/HPF    Bacteria None None-Rare/HPF   Basic Metabolic Panel, Blood Green Plasma Separator Tube   Result Value Ref Range    Glucose 121 (H) 70 - 99 mg/dL    BUN 24 (H) 8 - 23 mg/dL    Creatinine 1.61 (H) 0.67 - 1.17 mg/dL    eGFR Based on CKD-EPI 2021 Equation 58 mL/min/1.73 m2    Sodium 139 136 - 145 mmol/L    Potassium  4.2 3.5 - 5.1 mmol/L    Chloride 99 98 - 107 mmol/L    Bicarbonate 28 22 - 29 mmol/L    Anion Gap 12 7 - 15 mmol/L    Calcium 9.8 8.5 - 10.6 mg/dL   CBC w/ Diff Lavender   Result Value Ref Range    WBC 6.7 4.0 - 10.0 1000/mm3    RBC 3.04 (L) 4.60 - 6.10 mill/mm3    Hgb 9.4 (L) 13.7 - 17.5 gm/dL    Hct 16.1 (L) 09.6 - 50.0 %    MCV 94.7 79.0 - 95.0 um3    MCH 30.9 26.0 - 32.0 pgm    MCHC 32.6 32.0 - 36.0 g/dL    RDW 04.5 (H) 40.9 - 14.0 %    MPV 9.3 (L) 9.4 - 12.4 fL    Plt Count 260 140 - 370 1000/mm3    Segs 65.0 %    Imm Gran % 0.6 <1 %    Lymphocytes 17.3 %    Monocytes 8.5 %    Eosinophils 8.2 %    Basophils 0.4 %    ANC-Automated 4.4 1.6 - 7.0 1000/mm3    Imm Gran Abs 0.0 <0.1 1000/mm3    Abs Lymphs 1.2 0.8 - 3.1 1000/mm3    Abs Monos 0.6 0.2 - 0.8 1000/mm3     Abs Eosinophils 0.6 (H) 0.0 - 0.5 1000/mm3    Abs Basophils 0.0 <0.2 1000/mm3    Diff Type Automated      *Note: Due to a large number of results and/or encounters for the requested time period, some results have not been displayed. A complete set of results can be found in Results Review.       Data Reviewed:      Limited Bedside Ultrasound Procedure:   All images archived on [facility] servers.        Risk of Complications and/or Morbidity:        The note represents a split/shared encounter where my attending physician performed the substantive portion of the visit by completing the Medical Decision Making in its entirety.          Matt Holmes [place], [place]  01/15/24 1424       Colin Rhein, MD  01/17/24 1521

## 2024-01-17 ENCOUNTER — Other Ambulatory Visit: Payer: Self-pay

## 2024-01-17 ENCOUNTER — Encounter (HOSPITAL_BASED_OUTPATIENT_CLINIC_OR_DEPARTMENT_OTHER): Payer: Self-pay | Admitting: Hematology & Oncology

## 2024-01-17 ENCOUNTER — Encounter (HOSPITAL_BASED_OUTPATIENT_CLINIC_OR_DEPARTMENT_OTHER): Payer: Self-pay | Admitting: Family Medicine

## 2024-01-17 LAB — URINE CULTURE: Urine Culture Result: NO GROWTH

## 2024-01-17 NOTE — Progress Notes (Signed)
Refill Reminder    Indication: ONCOLOGY/HEMATOLOGY  Medication: ONCOLOGY MED balversa    Methods of HIPAA Verification (select 2): Name and DOB    Changes Since Last Visit: None    Is patient ready to refill? Yes, refill initiated in [place].      Has the patient reported experiencing any of the following? N/A    Medication Adherence    Patient reported X missed doses in the last month: 0  Any gaps in refill history greater than 2 weeks in the last 3 months: no  Demonstrates understanding of importance of adherence: yes  Informant: patient  Reliability of informant: reliable  Adherence tools used: calendar  Support network for adherence: healthcare provider  Confirmed plan for next specialty medication refill: pick-up at pharmacy  Refills needed for supportive medications: not needed       Stephen [name] has 7 day(s) of medication on hand.  Has Stephen [name] started or stopped any medications (includes Rx, OTC, herbal supplements) since last refill? no  Please confirm the following: Pick up date [phone]    Patient Support    Was any additional patient support provided?: No, patient does not need extra support at this time.         Next scheduled lab draw: none  Next scheduled follow up with provider: none  Patient's current quality of life? (1=poor, 10=excellent) N/A  Does patient need to speak with pharmacist? No intervention necessary.

## 2024-01-18 ENCOUNTER — Telehealth (HOSPITAL_BASED_OUTPATIENT_CLINIC_OR_DEPARTMENT_OTHER): Payer: Self-pay

## 2024-01-18 NOTE — Telephone Encounter (Signed)
[facility] OUTPATIENT PAVILION / Medical Oncology Social Work Telephone note:    Follow up:  Chart shows that Onalee Hua has been admitted to a SNF, [facility], on 1/27 from [facility] ED.  Social work phone call to follow up and check in with [place].    Pt presents as open and receptive.  He is appreciative of my call today.  He appears quite hopeful that he is gaining strength back and his medical status is improving.   [facility], "I'm walking more and my feet are better (from neuropathy symptoms)."  He is participating in daily PT and says he's making gains.  He hopes to d/c to home soon.     I encourage [name] to take full advantage of the rehab services at the [facility] before d/c'ing to home care where his sister takes care of him.  [name] voices understanding.     [name] is using insurance sponsored transportation and will be taking an Benedetto Goad to [facility] on Monday, 2/3 for his palliative care appt with Dr. Logan Bores at 10 am.    We agreed to meet outside the [facility] clinic at 11 am for ongoing social work intervention, as per request of [name].    Plan:  Will f/u in person on 2/3.  Elsworth Soho, RN, of [facility] Med/Onc and Dr. Logan Bores of palliative care updated via epic message.    Kingsley Plan, LCSW  [facility] Social Worker  [phone]

## 2024-01-19 ENCOUNTER — Other Ambulatory Visit: Payer: Self-pay

## 2024-01-19 ENCOUNTER — Encounter (HOSPITAL_BASED_OUTPATIENT_CLINIC_OR_DEPARTMENT_OTHER): Payer: Self-pay | Admitting: Hematology & Oncology

## 2024-01-19 ENCOUNTER — Encounter (HOSPITAL_BASED_OUTPATIENT_CLINIC_OR_DEPARTMENT_OTHER): Payer: Self-pay

## 2024-01-19 ENCOUNTER — Ambulatory Visit: Payer: Medicare Other | Admitting: Hematology & Oncology

## 2024-01-19 DIAGNOSIS — N139 Obstructive and reflux uropathy, unspecified: Secondary | ICD-10-CM

## 2024-01-19 DIAGNOSIS — L271 Localized skin eruption due to drugs and medicaments taken internally: Secondary | ICD-10-CM

## 2024-01-19 DIAGNOSIS — C642 Malignant neoplasm of left kidney, except renal pelvis: Secondary | ICD-10-CM

## 2024-01-19 DIAGNOSIS — G893 Neoplasm related pain (acute) (chronic): Secondary | ICD-10-CM

## 2024-01-19 NOTE — Interdisciplinary (Signed)
MCVV-- AVS sent to pt via mychart.  RN called and LVM for the pt with AVS review and her callback number should pt have additional questions.

## 2024-01-19 NOTE — Progress Notes (Signed)
---------------------(data below generated by Cheri Guppy, MD)--------------------     Patient Verification & Telemedicine Consent & Financial Waiver:    1.   Identity: I have verified this patient's identity to be accurate.  2.   Consent: I verify consent has been secured in one of the following methods: (a) obtained written/ online attestation consent (via MyChartVideoVisit pathway), (b) the spoke-side provider has obtained verbal or written consent from patient/surrogate (if this is a "provider to provider" evaluation), or (c) in all other cases, I have personally obtained verbal consent from the patient/ surrogate (noting all elements below) to perform this voluntary telemedicine evaluation (including obtaining history, performing examination and reviewing data provided by the patient).   The patient/ surrogate has the right to refuse this evaluation.  I have explained risks (including potential loss of confidentiality), benefits, alternatives, and the potential need for subsequent face to face care. Patient/ surrogate understands that there is a risk of medical inaccuracies given that our recommendations will be made based on reported data (and we must therefore assume this information is accurate).  Knowing that there is a risk that this information is not reported accurately, and that the telemedicine video, audio, or data feed may be incomplete, the patient agrees to proceed with evaluation and holds [name] harmless knowing these risks.  3.   Healthcare Team: The patient/ surrogate has been notified that other healthcare professionals (including students, residents and Engineer, maintenance) may be involved in this audio-video evaluation.   All laws concerning confidentiality and patient access to medical records and copies of medical records apply to telemedicine.  4.   Privacy: If this is a Restaurant manager, fast food Visit, the patient/ surrogate has received the [place] Notice of Privacy Practices via E-Checkin  process.  For all other video visit techniques, I have verbally provided the patient/ surrogate with the [place] web link in [place] (https://[email or web address].si.aspx) or Spanish ([email or web address]://[email or web address].[email or web address].aspx).  The patient/ surrogate acknowledges both being provided the NPP link, and has been offered to have the NPP mailed to the patient/ surrogate by [name] mail.  The patient/ surrogate has voiced understanding an acknowledgement of receipt of this NPP web address.  If the patient/surrogate has elected to receive the NPP via [name] mail, I verify that the NPP will be sent promptly to the patient/surrogate via [name] mail.  5.   Capacity: I have reviewed this above verification and consent paragraph with the patient/ surrogate and the patient is capacitated or has a surrogate. If the patient is not capacitated to understand the above, and no surrogate is available, since this is not an emergency evaluation, the visit will be rescheduled until such time that the patient can consent, or the surrogate is available to consent. If this is an emergency evaluation and the patient is not capacitated to understand the above, and no surrogate is available, I am proceeding with this evaluation as this is felt to be an emergency setting and no appropriate specialist is available at the bedside to perform these evaluations.  6.   Financial Waiver: If this is a Restaurant manager, fast food Visit, the patient has been made aware of the financial waiver via E-Checkin process.  For all other video visit techniques, an E-Checkin process is not performed.  As such, I have personally verbally informed the patient/ surrogate that this evaluation will be a billable encounter similar to an in-person clinic visit, and the patient/ surrogate has agreed to pay the fee for services rendered.  If we are billing insurance for the  patient's telehealth visit, his out-of-pocket cost will be determined based on his plan and will be billed to  him.    7.   Intra-State Location: The patient/ surrogate attests to understanding that if the patient accesses these services from a location outside of [place], that the patient does so at the patient's own risk and initiative and that the patient is ultimately responsible for compliance with any laws or regulations associated with the patient's use.  8.   Specific Use:The patient/ surrogate understands that [facility] makes no representation that materials or servicesdelivered via telecommunication services, or listed on telemedicine websites, are appropriate or available for use in any other location.           Demographics:  Medical Record #: [id number]  Date: January 19, 2024  Patient Name: Jayme Cham  DOB: 10-04-1951  Age: 73 year old  Sex: male  Location: Home address on file  Patient seen Status: Patient was evaluated via telemedicine (audio or audio/video)     Evaluator(s):  Nickolus Wadding was evaluated by me today.    Clinic Location: [facility] OUTPATIENT [facility]AVILION    [facility]  [address]  [place] [address]        Name: Favor Hackler   Date of Birth: Jan 14, 1951  Medical Record Number: [id number]    Genitourinary Medical Oncology Clinic     Patient ID: Metastatic urothelial carcinoma    Stage: Cancer Staging   Ureter malignant neoplasm, left (CMS-HCC)  Staging form: Renal Pelvis And Ureter, AJCC 8th Edition  - Clinical: Stage IV (cTX, cN0, pM1) - Signed by Cheri Guppy, MD on 12/01/2022    Oncologic History:    04/22/21  CTU: filling defect in L renal pelvis  05/05/21  Cysto with L ureteroscopy revealed a L papillary tumor, biopsied: mixed high (30%) and low grade urothelial carcinoma  06/16/21  Repeat cysto, papillary tumor: pathology low grade papillary UC  09/xx/22  Completed induction with mitogel (6 cycles)  08/19/21  HGTa of the bladder, no muscle  08/26/21  Repeat TURBT: NED, muscle identified  10/26/21  MRU: mild thickening in the L renal pelvis  12/24/21  Cysto/L  ureteroscopy: dysplasia in the bladder; no disease in L ureter  05/06/22  L PCN placed  06/01/22  TURBT and L ureteroscopy: NED, stricture in place, stent placed  09/29/22  TURBT and L ureteroscopy: NED  10/22/22  MRU: Mild diffuse thickening in the L ureter/renal pelvis. Multiple osseous lesions involving the bilateral iliac crest and right acetabulum concerning for osseous metastatic disease.   11/26/22  PET/CT: diffuse osseous FDG activity with discrete osseous lesions concerning for metastatic disease.  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  Also treated for septic shock, AKI (Cr 2.5) and acute liver injury (AST/ALT 2800/2100).   S/p exchange L PCN.   02/07/23  start XRT to R Shoulder  02/10/23  C2D1 EV (dose reduced to 1.0 mg/kg) + pembro 200 mg IV  03/03/23  C3D1 EV/pembro, missed D8  03/06/23  Cysto: NED.  Cytology NED.   03/14/23  Seen at [facility] ED for dislodged tube, replaced  04/02-05/24  Admitted  with weakness, poor PO intake.   04/06/23  C4D1 EV/pembro  04/13/23  C4D8 EV  04/26/23  PET/CT: osseous lesions much improved, many new sclerotic lesions c/w treatment change.  Nonspecific mediastinal and pelvic LN  05/09-11/24  Admitted for treatment of AKI and rhabdomyolysis  05/11/23  C5D1 EV/P DR to 0.75  06/01/23  C6D1 EV/Pembro 0.75  06/23/23  MRI C-Spine: disease in spine, but no fracture or epidural involvement.  moderate spinal canal stenosis from DJD at C4-C5, and C5-C6 and abutment of ventral cord  06/29/23  C7D1  EV/Pembro 0.75   07/19/23  PET/CT: POD in Lns and osseous lesions  07/20/23  C8D1  EV/Pembro 0.75  09/xx/24  Attempted to place on FGFR trial  08/22/23  CT chest, urogram:   09/20/23  Started on  erdafitinib  11/13/23  PET/CT: overall interval progression of previous thoracic, mediastinal and abdominal lymph adenopathy. 2. Although there has been interval improvement/resolution of previous multiple lesions in the sternum, spine and pelvis, there are multiple new lesions in L4, right posterior L2, and left lateral 4th and 7th ribs.  11/23/23  Saw me, significant side effects from erda (HFS, diarrhea), held erda  12/10-16/24  Admitted for UTI, constipation and   12/02/23  MRI spine: diffuse osseous disease, epidural extension at T4, minimal enhancement at L5-S1 of cauda equina nerve roots, no evidence of leptomeningeal disease  01/14-01/22/25  Admitted with worsening pain in feet, back. MRI with diffuse disease.  S/p XRT to C7-T4 and L1-L4 in single fraction on 1/17.  01/14/23  Seen in ED with hope to be placed.  Dc'd to [facility].      Interim History:  MCVV for follow up.  He is now staying at the [facility].  Walking well.  Bowels are better.  Eating better. Back always hurts.    He is still taking erdafitinib.  He would like to continue with treatment.     Review of Systems:   A complete ROS was performed and is negative except as documented above.     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  Non smoker  No etoh    Medications:  Current Outpatient Medications on File Prior to Visit   Medication Sig Dispense Refill    acetaminophen (TYLENOL) 325 MG tablet Take 3 tablets (975 mg) by mouth every 8 hours. 60 tablet 0    allopurinol (ZYLOPRIM) 100 MG tablet Take 1 tablet (100 mg) by mouth daily. 30 tablet 0    aluminum-magnesium-simethicone (MAG-AL PLUS) 200-200-20 MG/5ML suspension Mix viscous lidocaine 2%, Mylanta, and Benadryl  liquid into a 1:1:1 solution.  Take 10mL by mouth every 4 hours as needed for pain.  Swish and spit or swallow for pain. If swallowed, wait 10 minutes prior to eating/drinking. May also apply medication to a Q-tip and dab on areas of sores. 355 mL 2    Artificial Tear Solution (SOOTHE XP OP)       busPIRone (BUSPAR) 10 MG tablet Take 2 tablets (20 mg) by mouth 2 times daily. 60 tablet 2    diphenhydrAMINE (BENADRYL) 12.5 MG/5ML liquid Mix viscous lidocaine 2%, Mylanta, and  Benadryl liquid into a 1:1:1 solution.  Take 10mL by mouth every 4 hours as needed for pain.  Swish and spit or swallow for pain. If swallowed, wait 10 minutes prior to eating/drinking. May also apply medication to a Q-tip and dab on areas of sores. 236 mL 2    DULoxetine (CYMBALTA) 20 MG CR capsule Take 1 capsule (20 mg) by mouth 2 times daily. 180 capsule 0    Erdafitinib 3 MG TABS Take 6 mg by mouth daily. 60 tablet 11    fluticasone propionate (FLONASE) 50 MCG/ACT nasal spray Spray 2 sprays into each nostril daily. 16 g 2    gabapentin (NEURONTIN) 300 MG capsule Take 1 capsule (300 mg) by mouth 3 times daily. 90 capsule 2    HYDROmorphone (DILAUDID) 2 MG tablet Take 1 to 2 tablets (2mg  to 4mg ) by mouth every 4 hours as needed for moderate to severe pain 90 tablet 0    ketotifen (ALAWAY) 0.025 % ophthalmic solution Place 1 drop into both eyes 2 times daily.      lidocaine (XYLOCAINE) 2 % solution Mix viscous lidocaine 2%, Mylanta, and Benadryl liquid into a 1:1:1 solution.  Take 10mL by mouth every 4 hours as needed for pain.  Swish and spit or swallow for pain. If swallowed, wait 10 minutes prior to eating/drinking. May also apply medication to a Q-tip and dab on areas of sores. 200 mL 2    Multiple Vitamins-Minerals (MULTIVITAMIN WITH MINERALS) TABS tablet Take 1 tablet by mouth daily. 30 tablet 1    naloxone (NARCAN) 4 mg/0.1 mL nasal spray For suspected opioid overdose, call 911! Then spray once in one nostril. Repeat after 3 minutes if no  or minimal response using a new spray in other nostril. 2 each 0    Nutritional Supplements (NEPRO) LIQD Take 3 Cans by mouth daily. 30000 mL 11    nystatin (MYCOSTATIN) 100,000 units/mL suspension 5 mL (500,000 Units) by Swish & Swallow route 4 times daily. 280 mL 0    pembrolizumab (KEYTRUDA) 100 MG/4ML SOLN       polyethylene glycol (GLYCOLAX) 17 GM/SCOOP powder Take 17 grams (1 capful) by mouth daily. Mix with 4 to 8 ounces of fluid (water, juice, soda, coffee, or tea) prior to administration. 238 g 0    prochlorperazine (COMPAZINE) 10 MG tablet Take 1 tablet (10 mg) by mouth every 6 hours as needed (Nausea/Vomiting). 90 tablet 2    senna (SENOKOT) 8.6 MG tablet Take 2 tablets (17.2 mg) by mouth at bedtime. 60 tablet 3    tizanidine (ZANAFLEX) 2 MG tablet Take 1 tablet (2 mg) by mouth every 8 hours as needed (muscle spasm). 30 tablet 2    traZODone (DESYREL) 50 MG tablet Take one tab nightly by mouth at bedtime as needed for insomnia 90 tablet 0     No current facility-administered medications on file prior to visit.       Physical Examination:  There were no vitals filed for this visit.    ECOG PS 1  NAD  MCVV      Labs: Following labs reviewed  Lab Results   Component Value Date    WBC 6.7 01/15/2024    RBC 3.04 (L) 01/15/2024    HGB 9.4 (L) 01/15/2024    HCT 28.8 (L) 01/15/2024    MCV 94.7 01/15/2024    MCHC 32.6 01/15/2024    RDW 14.2 (H) 01/15/2024    PLT 260 01/15/2024    MPV 9.3 (L) 01/15/2024  Lab Results   Component Value Date    BUN 24 (H) 01/15/2024    CREAT 1.33 (H) 01/15/2024    CL 99 01/15/2024    NA 139 01/15/2024    K 4.2 01/15/2024    [place] 9.8 01/15/2024    TBILI 0.47 01/10/2024    ALB 3.1 (L) 01/10/2024    TP 6.0 01/10/2024    AST 20 01/10/2024    ALK 98 01/10/2024    BICARB 28 01/15/2024    ALT 10 01/10/2024    GLU 121 (H) 01/15/2024                  Assessment:  Lenvil Swaim is a 73 year old male with OA, HTN, MDD with L UTUC with metastatic disease ([id number]), with osseous mets  (Pet/CT with diffuse enhancement and discrete lesions). FGFR-TACC mutation.  His lines of therapy have included:  EV/P --> POD after 8 cycles  erdafitinib    I personally reviewed labs as above.  I personally reviewed scans as above.    [name] appears to be doing better since being admitted to the [facility] 3 days ago.  Eating better. Feet are better.  Pain controlled.  He is hoping to leave and go home.  He appears quite thin.  His most recent scans have not clearly demonstrated POD on erdafitinib, but certainly the drug has had its toxicities.  I discussed options with [name] including hospice, particularly given recurrent trips to the ED and hospital.  [name] is not ready for hospice and he would ike to continue treatment.  We will plan to repeat a PET/CT and I will follow up with a MCVV in 3 weeks.  If there is clear progression, we will once again need to discuss the utility of further treatment vs hospice.  May consider single agent gem, carbo-gem or Enhertu (he has 2+)    Advanced Urothelial Carcinoma, [id number]  - continue erdafitinib 6 mg daily  - continue phoslo 1333 mg TID  - labs (CBC, CMP, phos) and visits Q3w  - s/p XRT to R shoulder  - s/p XRT to C7-T4 and L1-L4  - consider adding zometa given significnt bone disease; needs dental clearance - he's known to have significant dental issues so this needs to be cleared first  - MCVV in 3 weeks with PET/CT and labs prior    Hand-foot syndrome  - severe on 8 mg daily  - cream for hands  - watch as lowering dose  - take breaks for 2-3 days when starting to have symptoms    Erda-induced diarrhea  - severe on erda 8 mg daily  - however, may have been due to overflow diarrhea  - imodium prn  - watch as restarting therapy    Cancer related pain, constipation  - follows with palliative care, on multiple medications for pain including dilaudid  - caution given multiple events of AMS    L PCN, initially placed 04/2022  - he will discuss draining issues with IR    This visit was  spent addressing a complex, chronic illness that poses a threat to life or bodily function to this patient.  I independently reviewed the patient's history since prior visit, reviewed intervening labs and imaging, counseled the patient on the management of their chronic illness, and placed orders to be done prior to next visit.     Onalee Hua has advanced urothelial carcinoma, currently on erdafitinib that has a risk of causing harm and needs monitoring  of cbc, LFTs, electrolytes via labs tests every 3 weeks    Asa Saunas. Roseanne Reno, M.D.  Assistant Professor of Medicine   [employer] [facility] [place], [facility]  [facility]  [address]  [place], [place] [address]

## 2024-01-19 NOTE — Patient Instructions (Signed)
Patient Instructions:    -- You have been scheduled for a video visit with Dr. Roseanne Reno on 2/21 at 11:30 am.  Please call [phone] if you need to re-schedule this time.    --Dr. Roseanne Reno has ordered a PET Scan.  Call the PET scheduler at [phone] to schedule this for BEFORE 2/21    -- Complete labs every 3 weeks:  CBC, CMP, Phosphorous      --Continue Erdafitinib 6 mg daily      Have questions about Advanced Care Planning/Advance Directive?  PREPARE ([email or web address].org)           Future Appointments   Date Time Provider Department Center   01/22/2024 10:00 AM Shea Evans, MD [facility] On[facility]         Visit this website for Cancer resources at our [facility].  This includes information on [place] Chemo Support Groups, Mind/Body Wellness, Treatment Education Classes, and other [place] Cancer Services:      [email or web address]       *Chief Operating Officer Website:  [email or web address]    Contact Information:   Brock Ra, MD   Assistant Professor of Medicine  Medical Oncology  Genitourinary Malignancy      Franco Nones, M.S., PA-C  Sr. Physician Assistant    Clinical questions or concerns  Oncology Nurse Case Manager   Elsworth Soho RN, BSN  T: [phone]  Department of Urology   [facility]  [address]  [place], [place] [address]    Dr. Roseanne Reno Follow Up Appointment Assistance  Administrative Assistant   [place]  T: [phone]  F: [phone]    Monday-Friday 8:00- 4:30p.m. (Closed Holidays and Weekends)  Please listen to message carefully for daily information.   Leave a detailed message with your name, medical record number and   telephone number. Messages are usually returned within 24 hours.     [facility] Phone List     AFTER HOURS EMERGENCY NUMBER   Ask for the Oncologist Physician On-Call.                    [phone]    Outpatient Pavilion Scheduling line: [phone]  Call for information about our new location and to schedule or cancel appointments.    [facility] Scheduling line: [phone]   Call to schedule your [name], CT Scans or MRI studies.    PET Scan Scheduling line: [phone]  Call to schedule your PET scan    Prostate MRI Scheduling line: [phone] or [phone]  Call to schedule your prostate MRI.   Performed at the [facility] ([facility]).    Nuclear Medicine Scheduling Line [phone]   Call to schedule your bone scan, Radium 223     [facility] Infusion Center: [phone]   Call to schedule, cancel, or reschedule chemotherapy appointments.  Infusion Center Hours--- Monday-Friday 7:00-7:30 p.m.; Sat-Sun 8:00-4:30pm.    [facility] ([address]): [phone]  Monday-Friday 730-530 p.m.; Closed Weekends    Radiation Oncology: [phone]    Call to schedule, cancel, or reschedule radiation appointments    Social Worker  [place]: Mathews [place], [place]    T: [phone]  [facility]: Henri Medal, LCSW  T: [phone]    Financial Counselors   [phone] at [facility]   [phone] or [phone]      [facility]  [phone]  [address]  [place], [place] [address]    -You can also reach [name] by MyChart. To set up your MyChart please see the following site: [email or web address]  -For cancer support services, please visit out Patient and [facility]  -For [facility] support services, please visit:  -[email or web address]  -For other support from the American Cancer Society, please visit: http://[email or web address].org/Treatment/SupportProgramsServices/index              Thank you for allowing [name] to participate in your care today! We value your feedback as we strive to provide every patient with an exceptional care experience.     You may receive  a patient satisfaction survey in the mail in the next few weeks. Please fill out the survey upon receipt and return it in the self-addressed envelope. Your feedback will be used to help improve the experience for all our patients at [facility].     If you want to share a positive experience you had at our facility please call We Listen at [phone]. If you have any suggestions on how we can improve our services please call the [facility] leadership team at [phone].    For medical questions or emergencies, please call your physician's office or 911. Thank you!

## 2024-01-21 ENCOUNTER — Encounter (HOSPITAL_BASED_OUTPATIENT_CLINIC_OR_DEPARTMENT_OTHER): Payer: Self-pay | Admitting: Family Medicine

## 2024-01-22 ENCOUNTER — Other Ambulatory Visit: Payer: Self-pay

## 2024-01-22 ENCOUNTER — Telehealth (HOSPITAL_BASED_OUTPATIENT_CLINIC_OR_DEPARTMENT_OTHER): Payer: Self-pay

## 2024-01-22 ENCOUNTER — Ambulatory Visit: Payer: Medicare Other | Attending: Family Medicine | Admitting: Family Medicine

## 2024-01-22 ENCOUNTER — Encounter (HOSPITAL_BASED_OUTPATIENT_CLINIC_OR_DEPARTMENT_OTHER): Payer: Self-pay

## 2024-01-22 VITALS — BP 135/70 | HR 66 | Temp 97.4°F | Resp 16 | Ht 63.5 in | Wt 144.3 lb

## 2024-01-22 DIAGNOSIS — K5903 Drug induced constipation: Secondary | ICD-10-CM | POA: Insufficient documentation

## 2024-01-22 DIAGNOSIS — Z936 Other artificial openings of urinary tract status: Secondary | ICD-10-CM | POA: Insufficient documentation

## 2024-01-22 DIAGNOSIS — G4700 Insomnia, unspecified: Secondary | ICD-10-CM | POA: Insufficient documentation

## 2024-01-22 DIAGNOSIS — Z9189 Other specified personal risk factors, not elsewhere classified: Secondary | ICD-10-CM | POA: Insufficient documentation

## 2024-01-22 DIAGNOSIS — C7951 Secondary malignant neoplasm of bone: Secondary | ICD-10-CM | POA: Insufficient documentation

## 2024-01-22 DIAGNOSIS — G893 Neoplasm related pain (acute) (chronic): Secondary | ICD-10-CM | POA: Insufficient documentation

## 2024-01-22 DIAGNOSIS — T402X5A Adverse effect of other opioids, initial encounter: Secondary | ICD-10-CM | POA: Insufficient documentation

## 2024-01-22 DIAGNOSIS — R0982 Postnasal drip: Secondary | ICD-10-CM | POA: Insufficient documentation

## 2024-01-22 DIAGNOSIS — M792 Neuralgia and neuritis, unspecified: Secondary | ICD-10-CM | POA: Insufficient documentation

## 2024-01-22 DIAGNOSIS — Z515 Encounter for palliative care: Secondary | ICD-10-CM | POA: Insufficient documentation

## 2024-01-22 DIAGNOSIS — F419 Anxiety disorder, unspecified: Secondary | ICD-10-CM | POA: Insufficient documentation

## 2024-01-22 DIAGNOSIS — C7910 Secondary malignant neoplasm of unspecified urinary organs: Secondary | ICD-10-CM | POA: Insufficient documentation

## 2024-01-22 NOTE — Telephone Encounter (Signed)
[facility] Service Palliative Care Note    [facility] [place] facility CN   Fax:  [phone]  Phone:  [phone]    Placed a call and spoke to Victorino Dike confirmed the clinical notes for today's visit was received.   Requested to re-fax the medication lists and H & P to [phone].  She agreed.     Palliative Care contact number was provided for questions or needs.    Routing to team.

## 2024-01-22 NOTE — Progress Notes (Signed)
[name]patient Progress Note    CC: Pain, psychosocial support    ID: Rodrigues Urbanek is a 73 year old male with a history of metastatic urothelial carcinoma, OA, HTN, MDD, gout referred to Palliative Care clinic for symptom management and psychosocial support.    Interval Events:  -[name] was admitted to  from 01/02/24-01/10/24 for left low back and sacral pain, planned to continue duloxetine CR 20mg  PO BID, tizanidine 2mg  PO q8h PRN muscle spasms, hydromorphone 4mg  PO q4h PRN pain, acetaminophen 975mg  PO q8h.    [name] had an ED visit on 01/15/24 due to need for SNF placement.  Was transported to [facility].  [name] had a follow up with Oncology (Dr. Roseanne Reno) on 01/19/24 with plan for continuing erdafitinib, PET/CT    Subjective:    Today we met with patient in palliative care clinic.  We addressed several issues during his visit:     #) Pain: See description below.   Location: Low back, shoulder, neck; lower extremities  Quality: Sharp, aching  Severity:   Current pain score: 8  Duration/Timing: constant, fluctuating  Modifying factors       Aggravating factors: sitting down, activities, movement, turning head       Alleviating factors: tizanidine, hydromorphone, gabapentin, pregabalin?    -Since last visit, neck and shoulder pain have improved but having ongoing pain in back and legs  -Not taking acetaminophen PRN, wants this removed from medication list  -Taking tizanidine 2mg  PO q8h PRN muscle spasms, finds this helpful to take  -Recalls me having suggested another medication for nerve pain (perhaps cyclobenzaprine? But explained that is a muscle relaxer) that begins with a "C" and is not Cymbalta  -Takingn duloxetine CR 20mg  PO BID  -Taking gabapentin 300mg  PO TID  -Taking hydromorphone 2mg  1-2 tablets (2mg  to 4mg ) PO q4h PRN, reports taking 2 tablets 3 times per day, last this morning, gets this at facility and I don't have access to [facility] to review how often he is getting this  -Not taking  pregabalin  -Wants increase in doses of duloxetine CR and hydromorphone, also inquires about Fentanyl patch, explained to him that I need to review his 24 hour OME and opioid use to figure out if Fentanyl patch is appropriate/safe at this point, will attempt to call facility about this tomorrow  -Feels like numbness/tingling in legs is improving  -Denies any adverse effects of the duloxetine CR, gabapentin, hydromorphone or tizanidine such as sedation, lethargy, myoclonus or pruritis    #) Bowel movements:  -Reports diarrhea this past weekend, has now improved, thinks it was brought on by eating too many pureed foods  -Would like loperamide on file PRN as an order at his facility, I let him know I will recommend this  -Having regular stool   -Taking Senna 2 tablets PO nightly   -Taking Miralax 17gm PO QAM  -May need to contact facility to get medication list and verify exactly what he is getting.    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, has mild oropharyngeal dysphagia associated with mild reduction in base of tongue retraction and airway protection    #) Insomnia:  -Taking trazodone 50mg  PO nightly PRN, helps but often doesn't sleep well due to needing to wake up and change nephrostomy bag    #) Nephrostomy tube and hx of recurrent UTI symptoms:  -Has to frequently change nephrostomy bag  -Urine appears clear today, denies noticing any recent hematuria or other UTI symptoms  -No  fevers, chills, abdominal pain, flank pain reported today    #) Anxiety:  -Taking Buspar 20mg  PO BID, wants to increase the dose of this today if possible as he is having ongoing anxiety  -Reports having anxiety after recent admission to SNF, really wants to go home as soon as he can.  Also mentions having discussed hospice with Dr. Roseanne Reno recently and does not feel ready for hospice, "I'm not like those people in the plane crash who are dead already"  -Stopped taking hydroxyzine, states it didn't help with anxiety in the past (may also be worsening dry mouth when has taken this)  -Have been avoiding stronger anxiolytics such as lorazepam given hx of memory problems, frequent falls and hx of possible opioid overdose but may be something to consider while he is at [facility] in a monitored environment as he seems to be having worsening anxiety.  -Seeing Psychiatry, last visit on 11/15/23, have encouraged follow up on ongoing basis although that's been challenging for [name] with his numerous appointments and recent ED visits and hospitalizations    #) Post-nasal drip:  -Reports ongoing post-nasal drip and phlegm in his throat (which has been a chronic issue for him)  -Taking Flonase nasal spray as prescribed  -Has had 1 vomiting episode due to excessive phlegm in his throat  -Would like an additional medication ordered for post nasal drip  -Denies fever, chills, pharyngitis, shortness of breath, coughing, wheezing, chest pain, hemoptysis    #) Urothelial carcinoma:  -Currently on treatment with erdafitinib, having oral pain and hair loss and skin changes on his hands/feet as potential side effects.  Noted that Dr. Roseanne Reno discussed hospice at his most recent appointment but [name] reiterates several times that he does not feel ready for hospice today.      #) Psychosocial domain:  -Sister Rinaldo Cloud continues to help with his medications when he is at home  -Staying at facility ([facility]) right now, using [facility] to get to  appointments  -Hopes to be able to go home as soon as possible, trying to work to PT to get as strong as he can  -Walking with walker today, we discussed fall preventions as I worry about him with all of the medications he is taking  -He is okay with me reaching out to his sister to check in.    #) Advance care planning and goals of care:  -Health care agent: Colman Cater (sister)  -Has advance directive on file from 2019 which is notarized.  -Discussed option of hospice with Dr. Roseanne Reno recently.  He reiterates today that he is not ready for hospice.  [name] has already had several visits from hospice and feels he knows how to contact them when he is ready.  He states that he wants to continue his treatment for now until it is no longer effective and/or causes intolerable side effects.  ROS: Symptom Assessment      12/21/2023     8:22 AM 11/06/2023    10:00 AM 07/27/2023     5:52 PM 07/20/2023     3:38 PM 07/06/2023    10:14 AM 06/29/2023     9:53 AM 06/26/2023     8:00 AM   Palliative Care Symptom Assessment   Pain Level  5     5   Constipation  7     5   Tiredness  3     3   Nausea  0     0   Depression  6     9   Anxiety  6     9   Drowsiness  3     6   Appetite  1     4   Wellbeing  5     8   Dyspnea  0     0   Weight Loss  4        Insomnia       3   ECOG 3  2 2 2 2         Additional ROS reviewed in detail and negative:  Review of Systems   Constitutional:  Positive for appetite change, fatigue and unexpected weight change. Negative for chills, diaphoresis and fever.   HENT:   Positive for mouth sores and trouble swallowing. Negative for sore throat and voice change.    Eyes:  Negative for icterus.   Respiratory:  Negative for chest tightness, cough, hemoptysis, shortness of breath and wheezing.    Cardiovascular:  Negative for chest pain, leg swelling and palpitations.   Gastrointestinal:  Positive for diarrhea. Negative for abdominal pain, blood in stool, constipation, nausea and vomiting.   Genitourinary:  Positive  for nocturia. Negative for dysuria, frequency and hematuria.    Musculoskeletal:  Positive for arthralgias, back pain, gait problem and neck pain.   Skin:  Negative for rash.   Neurological:  Positive for extremity weakness, gait problem and numbness. Negative for light-headedness and speech difficulty.   Psychiatric/Behavioral:  Positive for sleep disturbance. Negative for suicidal ideas. The patient is nervous/anxious.         Outpatient Palliative Regimen:  Acetaminophen i75mg  PO q8h  (not taking, wants removed from medication list)  Duloxetine CR 20mg  PO BID  Prochlorperazine 10mg  PO q6h PRN nausea  Trazodone 50mg  PO nightly PRN insomnia  Gabapentin 300mg  PO TID  Hydromorphone 2mg  1-2 tablets (2mg  to 4mg ) PO q4h PRN pain  Miralax 17gm PO BID daily  Senna 2 tablets PO nightly   Narcan PRN  Tizanidine 2mg  PO q8h PRN muscle spasms  Magic mouthwash PRN  Buspar 20mg  PO BID    Conversion to Oral Morphine Equivalent = 20 to 40?  Unclear without review of MAR    Allergies & Reactions: Review of patient's allergies indicates Sulfa drugs    Medications: (reviewed today)  Current Outpatient Medications on File Prior to Visit   Medication Sig Dispense Refill    [DISCONTINUED] acetaminophen (TYLENOL) 325 MG tablet Take 3 tablets (975 mg) by mouth every 8 hours. 60 tablet 0    allopurinol (ZYLOPRIM) 100 MG tablet Take 1 tablet (100 mg) by mouth daily. 30 tablet 0    aluminum-magnesium-simethicone (MAG-AL PLUS) 200-200-20 MG/5ML suspension Mix viscous lidocaine 2%, Mylanta, and Benadryl liquid into a 1:1:1 solution.  Take 10mL by mouth every 4 hours as needed for pain.  Swish and spit or swallow for pain.  If swallowed, wait 10 minutes prior to eating/drinking. May also apply medication to a Q-tip and dab on areas of sores. 355 mL 2    Artificial Tear Solution (SOOTHE XP OP)       busPIRone (BUSPAR) 10 MG tablet Take 2 tablets (20 mg) by mouth 2 times daily. 60 tablet 2    diphenhydrAMINE (BENADRYL) 12.5 MG/5ML liquid Mix  viscous lidocaine 2%, Mylanta, and Benadryl liquid into a 1:1:1 solution.  Take 10mL by mouth every 4 hours as needed for pain.  Swish and spit or swallow for pain. If swallowed, wait 10 minutes prior to eating/drinking. May also apply medication to a Q-tip and dab on areas of sores. 236 mL 2    DULoxetine (CYMBALTA) 20 MG CR capsule Take 1 capsule (20 mg) by mouth 2 times daily. 180 capsule 0    Erdafitinib 3 MG TABS Take 6 mg by mouth daily. 60 tablet 11    fluticasone propionate (FLONASE) 50 MCG/ACT nasal spray Spray 2 sprays into each nostril daily. 16 g 2    gabapentin (NEURONTIN) 300 MG capsule Take 1 capsule (300 mg) by mouth 3 times daily. 90 capsule 2    HYDROmorphone (DILAUDID) 2 MG tablet Take 1 to 2 tablets (2mg  to 4mg ) by mouth every 4 hours as needed for moderate to severe pain 90 tablet 0    ketotifen (ALAWAY) 0.025 % ophthalmic solution Place 1 drop into both eyes 2 times daily.      lidocaine (XYLOCAINE) 2 % solution Mix viscous lidocaine 2%, Mylanta, and Benadryl liquid into a 1:1:1 solution.  Take 10mL by mouth every 4 hours as needed for pain.  Swish and spit or swallow for pain. If swallowed, wait 10 minutes prior to eating/drinking. May also apply medication to a Q-tip and dab on areas of sores. 200 mL 2    Multiple Vitamins-Minerals (MULTIVITAMIN WITH MINERALS) TABS tablet Take 1 tablet by mouth daily. 30 tablet 1    naloxone (NARCAN) 4 mg/0.1 mL nasal spray For suspected opioid overdose, call 911! Then spray once in one nostril. Repeat after 3 minutes if no or minimal response using a new spray in other nostril. 2 each 0    Nutritional Supplements (NEPRO) LIQD Take 3 Cans by mouth daily. 30000 mL 11    nystatin (MYCOSTATIN) 100,000 units/mL suspension 5 mL (500,000 Units) by Swish & Swallow route 4 times daily. 280 mL 0    pembrolizumab (KEYTRUDA) 100 MG/4ML SOLN       polyethylene glycol (GLYCOLAX) 17 GM/SCOOP powder Take 17 grams (1 capful) by mouth daily. Mix with 4 to 8 ounces of fluid  (water, juice, soda, coffee, or tea) prior to administration. 238 g 0    prochlorperazine (COMPAZINE) 10 MG tablet Take 1 tablet (10 mg) by mouth every 6 hours as needed (Nausea/Vomiting). 90 tablet 2    senna (SENOKOT) 8.6 MG tablet Take 2 tablets (17.2 mg) by mouth at bedtime. 60 tablet 3    tizanidine (ZANAFLEX) 2 MG tablet Take 1 tablet (2 mg) by mouth every 8 hours as needed (muscle spasm). 30 tablet 2    traZODone (DESYREL) 50 MG tablet Take one tab nightly by mouth at bedtime as needed for insomnia 90 tablet 0     No current facility-administered medications on file prior to visit.       Past Medical History:  Past Medical History:   Diagnosis Date    Chronic back pain     Congenital hydronephrosis  Gout     Headache     Hematuria     HTN (hypertension) 12/10/2021    Kidney disease     Kidney stones     Major depressive disorder, single episode     Polyarthropathy or polyarthritis of multiple sites     Retinal detachment     Urethral stricture        Past Surgical History:  Past Surgical History:   Procedure Laterality Date    CT INSERTION OF SUPRAPUBIC CATH  09/25/2015    NEPHRECTOMY Right 1955    APPENDECTOMY      COLONOSCOPY      CYSTOSCOPY      CYSTOSCOPY W/ LASER LITHOTRIPSY      OTHER SURGICAL HISTORY      Interstim 01/29/2011    SPINE SURGERY  09/21    Lumbar-sacral fusion    TRANSURETHRAL RESECTION OF PROSTATE         Social History:  Healthcare agent/surrogate decision-maker: Colman Cater (sister)  Social History     Social History Narrative    Not on file        Family History:  Family History   Adopted: Yes   Family history unknown: Yes       PEX:  Vitals:    01/22/24 0956   BP: 135/70   BP Location: Left arm   BP Patient Position: Sitting   BP cuff size: Regular   Pulse: 66   Resp: 16   Temp: 97.4 F (36.3 C)   TempSrc: Temporal   SpO2: 97%   Weight: 65.5 kg (144 lb 4.8 oz)   Height: 5' 3.5" (1.613 m)       General: No acute distress, thin, sitting in chair  HEENT: PERRLA, EOMI, no  conjunctivitis or scleral icterus noted.     Neck:  Trachea midline.  Lungs: Clear to auscultation bilaterally, no wheezes, no rhonchi, no rales  CV: Regular rate and rhythm, no murmurs, no rubs, no gallops  Abdomen: Soft, nondistended, nontender to palpation, no rebound or guarding, normoactive bowel sounds in all four quadrants, no CVAT, +nephrostomy tube noted with clear urine in bag on L side  Neuro: Oriented x3, no dysarthria, moving all four extremities spontaneously, decreased strength in lower extremities noted but still ambulating steadily with walker.    Psych: Thought process linear and goal-directed, thought content appropriate, mood anxious, affect anxious, no suicidal ideation, reports understandable anxiety about his cancer and ongoing cancer treatment  Skin: Warm and well perfused.  Brisk capillary refill.  Peeling skin noted on his bilateral hands/fingers      Pertinent Labs:     Lab Results   Component Value Date    WBC 6.7 01/15/2024    RBC 3.04 (L) 01/15/2024    HGB 9.4 (L) 01/15/2024    HCT 28.8 (L) 01/15/2024    MCV 94.7 01/15/2024    MCHC 32.6 01/15/2024    RDW 14.2 (H) 01/15/2024    PLT 260 01/15/2024    MPV 9.3 (L) 01/15/2024       Lab Results   Component Value Date    BUN 24 (H) 01/15/2024    CREAT 1.33 (H) 01/15/2024    CL 99 01/15/2024    NA 139 01/15/2024    K 4.2 01/15/2024     9.8 01/15/2024    TBILI 0.47 01/10/2024    ALB 3.1 (L) 01/10/2024    TP 6.0 01/10/2024    AST 20 01/10/2024    ALK 98  01/10/2024    BICARB 28 01/15/2024    ALT 10 01/10/2024    GLU 121 (H) 01/15/2024       Above labs reviewed    Pertinent Diagnostic Study Findings:  MRI Spine Survey w/wo contrast 12/02/23  IMPRESSION:  Patient motion and lumbar hardware susceptibility artifact degrades assessment. Within these limitations:     Redemonstrated postsurgical changes related to L2-S1 posterior instrumented fusion, posterior L2-L5 decompression, and L5-S1 anterior fixation. Interbody cages are again seen from L2-L3  through L5-S1. Stable size of a 6.2 x 2.4 cm (SI by AP) collection within the L3/L4 posterior surgical bed. Please note sterility of the collection cannot be determined by MRI.     Interval increase diffuse osseous metastatic disease within the visualized bones, including the spine, partially imaged ribs and sacrum. No new pathologic fractures identified. Previously noted epidural extension within the midthoracic spine is difficult to delineate from vascular enhancement, though this may be increased within the midthoracic neural foramina. No convincing epidural extension into the bony spinal canal, though again this is difficult to definitively evaluate due to venous epidural enhancement (particularly dorsally at T4).     Minimal enhancement along the cauda equina nerve roots at the L5-S1 levels on axial imaging is favored to be vascular, though a leptomeningeal processes not completely excluded with certainty.     Otherwise, no convincing suspicious intrathecal, leptomeningeal, intramedullary, or epidural enhancement at evaluable levels, noting poor assessment at postsurgical levels due to susceptibility artifact from spinal hardware.     Of note, no specific findings of osteomyelitis-discitis or epidural abscess. However, there is increased marrow edema due to progressive osseous metastasis, which makes superimposed infection difficult to exclude.     Redemonstrated degenerative findings with moderate canal stenosis at C4-C5 and C5-C6. Otherwise, no more than mild canal stenosis. No cord or cauda equina nerve root compression.     Evaluation of the neural foramina of the lumbar spine is limited due to susceptibility artifact from hardware, however there is no convincing high-grade foraminal stenosis of the lumbar spine. Probable moderate foraminal stenosis on the right at L1-L2 and possibly bilaterally at L4-L5, poorly characterized. Severe foraminal stenosis on the right at T2-T3 and possibly on the right at  C7-T1, poorly characterized. Moderate-to-severe foraminal stenosis on the right at T1-T2 and T10-T11. Other scattered levels of up-to-moderate thoracic foraminal stenosis.    [name] Lower Extremity DVT 11/28/23  IMPRESSION:  No DVT in the bilateral lower extremity.    CT Abdomen/Pelvis without contrast 11/28/23  IMPRESSION:  Interval retraction of the left nephrostomy tube, now terminating in the inferior pole cortex. This contributes to new moderate left hydronephrosis with superimposed perinephric/periureteral stranding, nonspecific but may represent inflammation/infection. Recommend consultation with interventional radiology for consideration of repositioning     Consolidative left basilar opacities, likely infectious/inflammatory..     Large rectal stool ball without definite evidence of stercoral colitis. Consider disimpaction.     Stable osseous and retroperitoneum metastatic disease.     Note that assessment of solid organs is limited in the absence of intravenous contrast.    PET/CT 11/13/23  IMPRESSION:  1. Compared to prior PET-CT 07/19/23 there has been overall interval progression of previous thoracic, mediastinal and abdominal lymph adenopathy.  2. Although there has been interval improvement/resolution of previous multiple lesions in the sternum, spine and pelvis, there are multiple new lesions in L4, right posterior L2, and left lateral 4th and 7th ribs.    CT Abdomen/Pelvis without contrast 12/19/23  FINDINGS:  There is linear scarring at the lung bases. Left basal infiltrate is present. No effusion. Unremarkable adrenal glands and liver. Possible sludge in the gallbladder. Right nephrectomy. Left nephrostomy tube in place. Unremarkable pancreas. No lymphadenopathy. No evidence of bowel obstruction. Large amount of stool in the rectum. Left inguinal hernia containing small amount of bowel. No free fluid or free air. Multiple osteoblastic lesions involving the axial skeleton as well as pelvic bony  structures similar to November 2024.     MRI Spine Survey w/wo contrast 01/05/24  IMPRESSION:  Patient motion degrades assessment, particularly of the lower thoracic/lumbar spine. Susceptibility artifact from lumbar spine hardware also degrades assessment of the lumbar spine.     Compared to the 12/02/2023 spine survey MRI and within be significant limitations, no convincing evidence disease progression (though see below).     Subtle apparent enhancement within the ventral and dorsal epidural region at L1-L2 characterized on sagittal imaging only (14/13; axial imaging is nondiagnostic); this was mentioned on the preliminary report. The ventral component is new, though strongly favored to reflect CSF artifact, given CSF artifact in this region on the sagittal T2 series. The dorsal enhancement is grossly stable compared to the prior, and is likely degenerative in etiology. (i.e., related to adjacent segment disease). Epidural tumor is felt unlikely.     Similar degree of scattered epidural/foraminal enhancement within the thoracic spine, for example on the right at T1-T2 and T2-T3; delineation between tumor and vasculature is challenging. Similar minimal enhancement along a few of the cauda equina nerve roots (noting that much of the cauda equina are poorly evaluated) is favored to be vascular or artifactual, with leptomeningeal enhancement felt less likely.     Otherwise, no convincing new or increasing intrathecal, leptomeningeal, or epidural enhancement. Please note evaluation for abnormal epidural/foraminal enhancement is particularly challenging due to artifacts and vascular/venous plexus enhancement.     Redemonstrated are multifocal areas of abnormal signal intensity and enhancement throughout the visualized bones, likely related to osseous metastasis. This is grossly stable, though exact comparison is challenging. No new pathologic fractures identified.     Redemonstrated are multiple postsurgical changes of  the lumbar spine with stable 6 cm collection within the L3/L4 posterior surgical bed. Please note sterility of the collection is not determined by MRI.     Redemonstrated degenerative findings with multilevel foraminal stenosis; see prior report for details. Redemonstrated mild grade 1 anterolisthesis of L5 on S1. Redemonstrated minimal retrolisthesis of L1 on L2. Consider dedicated imaging of the lumbar spine for further characterization, particularly of the neural foramina if there are radicular symptoms.     Interval increase in opacification within the left-greater-than-right lower lobes, possibly worsening atelectasis and/or consolidation.    Above findings reviewed    ASSESSMENT: Bernard Slayden is a 73 year old male with a history of metastatic urothelial carcinoma, OA, HTN, MDD, gout referred to Palliative Care clinic for symptom management and psychosocial support.    Medical chart reviewed in detail.  Above labs reviewed and notable for anemia, elevated creatinine, low albumin  Above imaging reviewed personally and notable for recent MRI Spine with diffuse osseous metastatic disease in the spine, ribs and sacrum; moderate canal stenosis at C4-C5 and C5-C6 with degenerative findings; recent DVT US with no DVT in bilateral LE; recent CT Abdomen/Pelvis with retraction of L nephrostoy tube, consolidative left basilar opacities, large rectal stool ball, stable osseous and retroperitoneal metastatic disease; recent ET/CT with progression of thoracic, mediastinal and abdominal lymphadenopathy with multiple  new lesions in L4, right posterior L2, and left lateral 4th and 7th ribs .  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  Patient has a history of opioid overdose, recommend avoiding long acting opioids at this time due to concern for memory issues.    -If patient requires 12mg  of hydromorphone or more in 24 hour period, he can be started on Fentanyl 25cmg/hr transdermal patch q72h  - discontinue acetaminophen 975mg  PO q8h per patient request  - recommend increasing duloxetine CR to 20mg  2 capsules (40mg ) PO QAM and 1 capsule (20mg ) PO nightly target pain and anxiety, monitor carefully for signs/symptoms of serotonin syndrome  - continue gabapentin 300mg  PO TID  - continue tizanidine at dose of 2mg  PO q8h PRN muscle spasms.    - continue magic mouthwash PRN  - continue Nystatin mouthwash per patient request for oral thrush when it occurs, patient has prescription on file that he can pick up  - have discontinued hydroxyzine, holding this right now as patient is at high risk of falls and he also did not feel it was helpful  - recommend increasing Buspar to 20mg  PO TID (maximum dose)  - while at SNF, can consider also adding lorazepam 0.5mg  PO daily PRN severe anxiety  - CURES reviewed  - Narcan PRN prescribed at previous visit and we discussed how/when to use in emergencies.  Confirmed that patient has this at home.  - continue Senna 2 tablets PO nightly  - continue Miralax 17gm PO daily  - recommend starting loperamide 2mg  PO QID PRN diarrhea  - continue prochlorperazine 10mg  PO q6h PRN nausea  - continue trazodone 50mg   PO nightly PRN insomnia.   - recommend starting azelastine  nasal spray 2 sprays q2h PRN nasal congestion  - continue follow up with Pain Management, Oncology  - Continue Nutrition follow up  - Continue Psychiatry follow up  - Patient is not ready for hospice at this time.  Hospice referral placed for informational visit only, patient has met with hospice and will contact hospice when ready to start services.  - please mychart message or call the palliative care clinic regarding symptom management    Follow up in palliative care clinic on 02/26/24 at 10 AM in person at [facility] (60 minutes)     Patient evaluated with the following [name] Service Members:  Georjean Mode, LVN  [place] Panagiotou, LCSW    Thank you for your consult.  We will continue to follow this patient and provide longitudinal care of patient's cancer related pain, symptom management and psychosocial support.    Shea Henok Heacock, MD  Assistant Professor  [facility]  [email or web address]    Total Attending time 65 min:  Pre-Visit:  7 min spent in chart review including progress notes, labs and imaging as well as discussion with interdisciplinary team regarding potential support needed.  Intra-Visit:  35 min spent face to face in counseling and/or coordination of care related to complex decision-making around goals of care and symptom management  Post-Visit:  23 min spent in ongoing coordination of care, placing orders and note completion.

## 2024-01-22 NOTE — Progress Notes (Signed)
[facility] OUTPATIENT PAVILION / Medical Oncology Social Work Telephone note:    Current status:  Met with Stephen [name] in-person in the waiting area outside [place] clinic after his appt with Dr. Logan Bores to provide ongoing emotional support as per his request.  Stephen [name] remains living at [facility] for rehab.  He thinks he has ~ one more week there.  He says he's making gains in PT and getting stronger.  We spoke for 20 minutes.  He was receptive and forthcoming.    Stephen [name] remains as independent as possible for his ADL's and manages his cancer care independently.  He has already scheduled the f/u PET scan ordered by Dr. Roseanne Reno.  Stephen [name] shares that he is doing well at [facility] and appreciates their care and the therapy.  He hopes to go home soon "some people stay there and never leave (on the SNF side)" which he does not want to happen to him.  Stephen [name] claims his sister is at home to care for him and he wants to do his best to be as independently as possible in order to relieve her burden.  He shares that they get along well and have a strong relationship.      Stephen [name] reviews his health concerns, like weakness, fatigue and diarrhea.  He says that Dr. 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    Intervention:  Provided Stephen [name] with supportive counseling and validation of feelings.      Plan:  Will continue to follow as needed.    Kingsley Plan, LCSW  [facility] Social Worker  [phone]

## 2024-01-22 NOTE — Patient Instructions (Addendum)
[facility] Palliative Care       PLAN OF CARE:    -I will recommend that your doctor increase hydromorphone to 2mg  2-3 tablets (4mg  to 6mg ) by mouth every 4 hours as needed for pain  -Continue lidocaine 5% patches as needed for pain.  Wear for 12 hours and take off for 12 hours each day when you use these  -I will recommend that your doctor increase duloxetine CR (Cymbalta) to 40mg  by mouth in the morning and 20mg  by mouth at night  -I will recommend that your doctor start capsaicin topical as needed for foot pain  -Continue taking gabapentin 300mg  1  capsule by mouth 3 times a day  -Continue Senna 2 tablets by mouth at bedtime  -Continue Miralax 1 tablespoon in water daily  -I will recommend that your doctor increase Buspar to 20mg  by mouth 3 times a day  -I will recommend that your doctor start azelastine nasal spray as needed for nasal congestion  -I will recommend that your doctor start loperamide (Imodium) 2mg  by mouth 4 times a day as needed for diarrhea  -Continue prochlorperazine 10mg  by mouth every 6 hours as needed for nausea  -Continue follow up with Psychiatry     We will see you for follow up on 02/26/24 at 10 AM in person at [place] (60 minutes)      List of Palliative Care Providers:  Marijean Heath, MD   Theda Belfast, NP  Gaspar Skeeters, NP  Margarita Mail, MD  Davy Pique, NP  Vivien Presto, NP  Novella Rob, MD  Berenice Primas, MD  Clint Guy MD  Era Skeen, DO FAAHPM  Awilda Metro, MD      Administrative Assistant/Team Coordinator   Jeannetta Ellis   Alcide Clever     Social Workers:   Laurena Spies, LCSW  [place] Panagiotou, LCSW    Spiritual Counselor/Chaplain  Lehman Prom, [facility]   Dan Europe, [place] [facility]     Nurse Case Managers  Sherald Barge, RN   Caralee Ates, RN  Reva Pedro Earls, RN   Amie Bronson Curb, LVN  Lucilla Lame, [place]  .............................................................................................................................................      HOW TO  CONTACT PALLIATIVE CARE  NURSE LINE--------[phone]--leave a voicemail and we will call you back  SCHEDULING------[phone]  FAX--------------------[phone]   [place]------Use MyChart Messaging for non-emergent issues  ?   HOURS: Monday through Friday 8:00 am - 5:00 pm?(closed holidays and weekends)      AFTER HOURS: For urgent symptom issues after hours, call [phone] and ask for the Doctor-on-Call for Palliative Care.   ?   Emergency: Call 911 or go to the nearest emergency room.?   .............................................................................................................................................   ?  [facility] Help Desk: [phone]      ............................................................................................................................................      Special Instructions Regarding Medications:   ?   1. Prescription Refills       -4 days before running out of your medication, NOTIFY us                 Use [phone] or via MyChart message.                 We can Mail you palliative care prescriptions if you like, they are managed by the [place] mail order pharmacy                Please let us know 4 business days in advance of running out    Mail order refills are ONLY delivered Monday-Friday (no holidays)  We will need to know the delivery address     Someone will need to be home to sign for the medication (will need to show drivers license and sign for delivery)    2. -Pain medication Agreement                Pain medications to treat cancer-relate pain should only be prescribed by [facility] palliative care unless you are in the ER               We must see you regularly to assess you and safely prescribe               If you miss an appointment or need to cancel/reschedule please call us right away at [phone]  ?   2. Storage                Medicines should be stored in the original bottles in a cool, dry place.                  Please keep them securely out of reach of children and pets.   ?   3. Disposal: Please dispose of your medicines safely. Here is a link to safe disposal sites in [place]: http://trevino.com/.php    ?   4. Precaution: Please keep all medications out of reach of children.   ?   .............................................................................................................................................?   Financial Counselors  [phone] [facility]  [phone] or [phone]     Advance Care Planning   ?   Many patients ask about the differences between an Advance Directive and a POLST.       Here is a Lobbyist that clarifies the differences between these documents:    [email or web address] ?      Differences between an Advance Directive and a POLST are:    An Advance Directive is for:    1. Anyone 58 years of age or older    2. Provides instructions for future treatment    3. Appoints a health care representative    4. Guides inpatient treatment decisions when make available       A POLST is for:    1. Anyone with a serious illness - any age   2. Provides medical orders for current treatment    3. Guides actions by Emergency Medical Personnel when made available    4. Guides inpatient treatment decisions when made available   5. This is a bright pink form that should be placed on your refrigerator    .............................................................................................................................................      Patient Experience Department   ?   The Patient Experience Department acts as a bridge between our patients, hospitals and physicians to respond to concerns, issues and requests. Patient experience specialists are here to help patients and their families communicate their experiences with [facility] and navigate our health system. Please call  [phone] or send an email to [email or web address].    ..............................................Marland Kitchen     MyPath     We are excited to share our new [facility] application: MyPath. MyPath will guide you through your unique cancer journey and connect you with our expansive network of curated support services and resources. It is available in [place] and [place].     You can download the MyPath application in four easy steps:     1.       Use the QR code to open your app store.  2.  Click on the MyPath app icon and install on your device.  3.       Open the MyPath app.  4.       Enter your [place] username and password to log in.

## 2024-01-22 NOTE — Progress Notes (Signed)
[facility] Palliative Care Note    [facility] facility CN   Fax:  [phone]  Phone:  [phone]     Spoke to Victorino Dike patient was admitted to their facility on 01/27.  Currently receiving skilled PT.  Progress notes for today's visit with recommendations faxed to the facility.   In addition, requested to fax the medications lists and H & P to Palliative Care and fax number provided.      Palliative Care contact number was provided for questions or needs.    Routing to team.

## 2024-01-22 NOTE — Progress Notes (Signed)
[name] Palliative Care Note     Reason for visit: Follow-up   Pain management   Psychosocial or family support      Diagnosis: MN of urinary bladder unspecified site     Advanced Care Planning: On File  Surrogate Decision Maker:  Colman Cater, sister  [phone]    Assessment:     Patient Orientation: A&O x 4, answers questions readily and appropriately with recent and remote memories intact    CC:   - would like to increase the buspar to keep him more calmer    General:     - nasal drip and taking flonase nasal spray   - weight lost of 8 lbs from 162 lbs to 154 lbs  - continues to manage nephrostomy   - use Fww when ambulating   -  he does not take tylenol      Pain Assessment:    Location: BLEs and back  - BLES described as neuropathic pain  - chronic pain   Quality: dull pain for his back   Severity:      Current pain score 1-10:  8      Worst pain score 1-10: 8  Duration/Timing: constant   Modifying factors       Aggravating factors: bending, long distance walking        Alleviating factors: pain medications help     Pain Medication:   1) cymbaltal 20 mg CR bid   2) gabapentin 300 mg 1 cap tid  3) hydromorphone 2 mg taking 4 mg avearage of 3x/day  4) xanaflex 2 mg taken at night       Nausea/vomiting:    - some nausea due to phlegm "nothing serious"    Appetite:   - stated he is eating better now.    - he did not take the nephro liquid for 2 days now due to diarrhea.       GI:  Bowel Movements:   - Consistency:  has diarrhea las Saturday.  He is back on having regular stool after taking puree food       Medication:   1) glycolax as ordered   2) senna as ordered.     Anxiety/depression:   - he would like the buspar to be increase to get more calmer.   - he has support from his sister Rinaldo Cloud     Medication:   - buspar 10 mg bid     Sleep/Fatigue   - report he is able to sleep well at night.  He is awake 2-3x/noc to drain the nephrostomy.  He is able to return to sleep.     Medication:   1) trazadone 50  mg at [facility]    Financial challenges: None.  Stated he is alright.     Well Being Assessment:   -Patient's needs currently being addressed.  Patient's affect/mood appropriate for situation.     Education: Chief Operating Officer and verbal instruction provided regarding plan of care.   Barriers to learning assessed: No barriers noted. Patient verbalized understanding of teaching and instructions.     Contact information, AVS given and reviewed related to plan of care. Questions answered to patient's satisfaction and verbalized understanding. Patient/family aware to call with any questions or concerns that may arise.    Georjean Mode, LVN

## 2024-01-22 NOTE — Progress Notes (Signed)
[name] Palliative Care   Social Work: Follow Up Visit     ID: Stephen [name] is a 73 year old male with a history of metastatic urothelial carcinoma, OA, HTN, MDD, gout referred to Palliative Care clinic for symptom management and psychosocial support.     Present: Pt Stephen [name], Dr. Margarita Mail and this LCSW.      Medical Issues: See Dr. Margarita Mail visit note.      Psychosocial: Stephen [name] is currently living at [facility] he's looking forward to going home, discharge may be next week.  Pt lives with his sister and  tries to get out of the home daily, even on days his energy is low. Enjoys taking the bus to the beach and walking their dog. Likes sitting at [facility]     Spirituality: Did not discuss during today's visit.      Mental Health/Coping: Stephen [name] presents somewhat cachectic and pleasant.  Stephen [name] reports is doing coping well and feels he's getting good care at [facility]; he is looking forward to going home.  Stephen [name] was told he is ready for Hospice but disagrees and does not feel he is dying yet.  Stephen [name] remains independent and want to manage his own appointments.  Stephen [name] feels well supported by his sister Stephen [name].     Advance Care Planning:    Goals of Care/Patient's Goals:  Continue with cancer treatments, he is not ready for hospice and possibly discharge next week from [facility] center back to his sister's home.       POLST/Advance Directives:  On file.       Health Care Representative/Surrogate Decision Maker (in absence of advance directive): Sister Stephen [name]      Assessment: Stephen [name] appears to have knowledge about his disease and the ongoing treatments.  Stephen [name] is currently at [facility] with possible discharge to his sister's home next week.  Stephen [name] appears fragile but engaging while continuing to manage his medications and Dr. Algie Coffer.  Stephen [name] wants to keep active and walk as much as possible.      Referrals Provided: None    Recommendations:  1) Continue to explore how the patient's  quality of life is impacted by their illness.  2) Explore resources as appropriate.  3) Continue to provide emotional support as needed.   4) SW PRN    [place] Raimi Guillermo, MSW, [employer]  Clinical Social Worker   [facility]-Palliative Medicine  Telephone: [phone]

## 2024-01-22 NOTE — Telephone Encounter (Addendum)
[facility] P: [phone] F: [phone] completed and faxed back. Fax confirmation received.

## 2024-01-23 ENCOUNTER — Encounter: Payer: Self-pay | Admitting: Family Medicine

## 2024-01-23 ENCOUNTER — Telehealth (HOSPITAL_BASED_OUTPATIENT_CLINIC_OR_DEPARTMENT_OTHER): Payer: Self-pay | Admitting: Family Medicine

## 2024-01-23 DIAGNOSIS — N529 Male erectile dysfunction, unspecified: Secondary | ICD-10-CM

## 2024-01-23 NOTE — Telephone Encounter (Signed)
[facility] Call Center - Telephone Message for MD/RN:    Incoming call received from:  Myra from [facility]     Provider: Shea Evans, MD   Reason for call: Stephen [name] is calling in from the skilled nursing facility requesting Last office visit notes to be sent over to them , please send over   Return call requested: No     Best callback number: Ph: [phone] Fx: [phone]    [facility] to leave a detailed message: Yes     Inform Caller:    The timeframe for return calls is typically within 24 hours or before the end of the business day.

## 2024-01-23 NOTE — Telephone Encounter (Signed)
[name] Palliative Care Note    Reason for call    Notes faxed to facility  faxed. [phone]     > Palliative Care contact number was provider [phone]

## 2024-01-24 ENCOUNTER — Telehealth: Payer: Self-pay

## 2024-01-24 MED ORDER — SILDENAFIL CITRATE 20 MG PO TABS: 40.0000 mg | ORAL_TABLET | Freq: Every day | ORAL | 3 refills | Status: DC | PRN

## 2024-01-24 NOTE — Telephone Encounter (Signed)
 Pharmacy Patient Advocate Encounter   Received notification from CoverMyMeds that prior authorization for Sildenafil  20 mg tablets is required/requested.   Insurance verification completed.   The patient is insured through [facility] .   Per test claim: PA required; PA started via CoverMyMeds. KEY [id number] . Waiting for clinical questions to populate.

## 2024-01-24 NOTE — Telephone Encounter (Signed)
 E-scribed refill

## 2024-01-25 ENCOUNTER — Other Ambulatory Visit: Payer: Self-pay

## 2024-01-26 NOTE — Telephone Encounter (Signed)
 Pharmacy Patient Advocate Encounter   Received notification from CoverMyMeds that prior authorization for Sildenafil  Citrate (PAH) 20MG  tablets is required/requested.   Insurance verification completed.   The patient is insured through [facility].   Per test claim: PA required; PA submitted to above mentioned insurance via CoverMyMeds Key/confirmation #/EOC [id number] Status is pending

## 2024-01-29 ENCOUNTER — Other Ambulatory Visit (HOSPITAL_BASED_OUTPATIENT_CLINIC_OR_DEPARTMENT_OTHER): Payer: Self-pay | Admitting: Family Medicine

## 2024-01-29 DIAGNOSIS — M62838 Other muscle spasm: Secondary | ICD-10-CM

## 2024-01-29 NOTE — Telephone Encounter (Signed)
[name] Progress Note    Received refill request for tizanidine 4mg  PO q8h PRN back pain.  Patient's last appointment with our team was on 01/22/24 and patient's next appointment with our team is on 02/26/24.  Patient has Narcan PRN on file.  Reviewed CURES, per CURES, last picked up hydromorphone 2mg  #30 on 01/28/24.  No aberrancies noted.  Will send refill as requested after verifying dose (currently taking tizanidine 2mg  PO q8h PRN, need to see if patient is requesting dose increase)    Shea Vauda Salvucci, MD  Assistant Professor  [facility] Palliative Care Service  [email or web address]

## 2024-01-30 NOTE — Telephone Encounter (Signed)
[name] Palliative Care Note    Re:  Zanaflex   [facility] [employer]      Placed a call and spoke to [facility] facility staff she confirmed he is taking zanaflex 2 mg every 8 hours as needed.  Stated the patient would like to pick up the medication from outside pharmacy.  They will talk to the patient facility pharmacy can provide the medication.      In addition, med list and clinical notes requested on 02/03 only the cover sheet was received.  Requested to re-fax the above and right fax number was provided.     Palliative Care contact number was provided for questions or needs.      Routing to team.

## 2024-01-31 ENCOUNTER — Other Ambulatory Visit: Payer: Self-pay | Admitting: Pharmacist

## 2024-01-31 ENCOUNTER — Other Ambulatory Visit: Payer: Self-pay

## 2024-01-31 ENCOUNTER — Ambulatory Visit: Payer: Medicare Other

## 2024-01-31 ENCOUNTER — Other Ambulatory Visit (INDEPENDENT_AMBULATORY_CARE_PROVIDER_SITE_OTHER): Payer: Medicare Other | Attending: Hematology & Oncology

## 2024-01-31 DIAGNOSIS — C642 Malignant neoplasm of left kidney, except renal pelvis: Secondary | ICD-10-CM | POA: Insufficient documentation

## 2024-01-31 DIAGNOSIS — C7910 Secondary malignant neoplasm of unspecified urinary organs: Secondary | ICD-10-CM | POA: Insufficient documentation

## 2024-01-31 LAB — CBC WITH DIFF, BLOOD
ANC-Automated: 6.3 10*3/uL (ref 1.6–7.0)
Abs Basophils: 0 10*3/uL (ref <0.2)
Abs Eosinophils: 0.1 10*3/uL (ref 0.0–0.5)
Abs Lymphs: 0.6 10*3/uL — ABNORMAL LOW (ref 0.8–3.1)
Abs Monos: 0.6 10*3/uL (ref 0.2–0.8)
Basophils: 0.1 %
Eosinophils: 0.7 %
Hct: 26.9 % — ABNORMAL LOW (ref 40.0–50.0)
Hgb: 8.9 g/dL — ABNORMAL LOW (ref 13.7–17.5)
Imm Gran %: 0.4 % (ref <1)
Imm Gran Abs: 0 10*3/uL (ref <0.1)
Lymphocytes: 8.3 %
MCH: 30.8 pg (ref 26.0–32.0)
MCHC: 33.1 g/dL (ref 32.0–36.0)
MCV: 93.1 um3 (ref 79.0–95.0)
MPV: 9.4 fL (ref 9.4–12.4)
Monocytes: 7.5 %
Plt Count: 233 10*3/uL (ref 140–370)
RBC: 2.89 10*6/uL — ABNORMAL LOW (ref 4.60–6.10)
RDW: 14.5 % — ABNORMAL HIGH (ref 12.0–14.0)
Segs: 83 %
WBC: 7.6 10*3/uL (ref 4.0–10.0)

## 2024-01-31 LAB — COMPREHENSIVE METABOLIC PANEL, BLOOD
ALT (SGPT): 43 U/L — ABNORMAL HIGH (ref 0–41)
AST (SGOT): 41 U/L — ABNORMAL HIGH (ref 0–40)
Albumin: 3.5 g/dL (ref 3.5–5.2)
Alkaline Phos: 152 U/L — ABNORMAL HIGH (ref 40–129)
Anion Gap: 10 mmol/L (ref 7–15)
BUN: 36 mg/dL — ABNORMAL HIGH (ref 8–23)
Bicarbonate: 30 mmol/L — ABNORMAL HIGH (ref 22–29)
Bilirubin, Tot: 0.74 mg/dL (ref <1.2)
Calcium: 11.1 mg/dL — ABNORMAL HIGH (ref 8.5–10.6)
Chloride: 96 mmol/L — ABNORMAL LOW (ref 98–107)
Creatinine: 1.21 mg/dL — ABNORMAL HIGH (ref 0.67–1.17)
Glucose: 118 mg/dL — ABNORMAL HIGH (ref 70–99)
Potassium: 4.6 mmol/L (ref 3.5–5.1)
Sodium: 136 mmol/L (ref 136–145)
Total Protein: 7.3 g/dL (ref 6.0–8.0)
eGFR Based on CKD-EPI 2021 Equation: >60 mL/min/{1.73_m2}

## 2024-01-31 LAB — PHOSPHORUS, BLOOD: Phosphorous: 6 mg/dL — ABNORMAL HIGH (ref 2.7–4.5)

## 2024-01-31 NOTE — Progress Notes (Signed)
Refill Reminder    Indication: ONCOLOGY/HEMATOLOGY  Medication: ONCOLOGY MED balversa    Methods of HIPAA Verification (select 2): Name and DOB    Changes Since Last Visit: None    Is patient ready to refill? Yes, refill initiated in [place].    Adverse Effects    Adverse events reported: No       Has the patient reported experiencing any of the following? N/A    Medication Adherence    Patient reported X missed doses in the last month: 0  Any gaps in refill history greater than 2 weeks in the last 3 months: no  Demonstrates understanding of importance of adherence: yes  Informant: patient  Reliability of informant: reliable  Provider-estimated medication adherence level: good  Reasons for non-adherence: no problems identified  Adherence tools used: calendar  Support network for adherence: healthcare provider  Confirmed plan for next specialty medication refill: pick-up at pharmacy       Stephen [name] has 0 dose(s) of medication on hand.  Has Stephen [name] started or stopped any medications (includes Rx, OTC, herbal supplements) since last refill? no  Please confirm the following: Pick up date 01/31/24    Patient Support    Was any additional patient support provided?: No, patient does not need extra support at this time.         Next scheduled lab draw: none  Next scheduled follow up with provider: none  Patient's current quality of life? (1=poor, 10=excellent) N/A  Does patient need to speak with pharmacist? No intervention necessary.

## 2024-02-02 ENCOUNTER — Telehealth (HOSPITAL_BASED_OUTPATIENT_CLINIC_OR_DEPARTMENT_OTHER): Payer: Self-pay

## 2024-02-02 NOTE — Telephone Encounter (Signed)
Stephen [name] left this message with the wrong office. She is calling stating they are having trouble with the rx Erdafitnib (balversa). She is requesting a call back at [phone]

## 2024-02-02 NOTE — Telephone Encounter (Signed)
RN provided callback to Java, NCM at the [facility] in [place] [phone]).      RN reacher her VM.  RN LVM with her callback number requesting a callback.

## 2024-02-06 ENCOUNTER — Encounter: Payer: Self-pay | Admitting: Hospital

## 2024-02-06 ENCOUNTER — Encounter (HOSPITAL_BASED_OUTPATIENT_CLINIC_OR_DEPARTMENT_OTHER): Payer: Self-pay | Admitting: Hematology & Oncology

## 2024-02-06 ENCOUNTER — Ambulatory Visit: Payer: Medicare Other

## 2024-02-06 ENCOUNTER — Ambulatory Visit (HOSPITAL_BASED_OUTPATIENT_CLINIC_OR_DEPARTMENT_OTHER): Payer: Medicare Other

## 2024-02-06 NOTE — Telephone Encounter (Signed)
RN was able to speak with SNF NCM, Christina.  She states that the pt ran out of Balvsersa medication about 1 week ago.  He recently developed a cough, they did a CXR and he has been diagnosed with PNA.    Trula Ore confirmed that the [facility] does not have an Programme researcher, broadcasting/film/video.  She confirmed that their pharmacy is unable to dispense Balversa.      RN reviewed today's PET scan appointment time, location, and instructions.  They were unaware of this appointment.  She will try to coordinate transportation for the pt and will make him NPO shortly.

## 2024-02-08 NOTE — Telephone Encounter (Signed)
Per pt, PET was cancelled d/t PNA.  R    N called the [facility] and spoke with [place], Charity fundraiser.  RN is requesting for recent lab results, progress note, radiology reports, and medications to be faxed to Dr. Marca Ancona office at [phone] in preparation for pt's mcvv on 2/21.  She VU and will do this today.

## 2024-02-09 ENCOUNTER — Ambulatory Visit: Payer: Medicare Other | Admitting: Hematology & Oncology

## 2024-02-09 DIAGNOSIS — C642 Malignant neoplasm of left kidney, except renal pelvis: Secondary | ICD-10-CM

## 2024-02-09 NOTE — Telephone Encounter (Signed)
 [facility] Service Palliative Care Note    [facility]  [place]   Fax:  [phone]  Phone:  [phone]    Spoke to Trula Ore confirmed the patient still in the facility.   Tizanidine is an active medication and patient asked for it as needed, provided by the facility.    In addition, AVS faxed on 02/03 with recommendation of Dilaudid confirmed by [facility], order was carried over.    Trula Ore will speak to the patient regarding his medications are available at the facility.  Tentative discharge plan is next week    Routing to team.

## 2024-02-09 NOTE — Progress Notes (Signed)
 Called patient, did not answer.  Left VM.  Will try to call him on Monday.

## 2024-02-13 ENCOUNTER — Emergency Department (HOSPITAL_COMMUNITY): Payer: Medicare Other

## 2024-02-13 ENCOUNTER — Inpatient Hospital Stay
Admission: EM | Admit: 2024-02-13 | Discharge: 2024-02-22 | DRG: 871 | Disposition: A | Discharge status: Hospice | Payer: Medicare Other | Source: Skilled Nursing Facility | Attending: Pulmonary Medicine | Admitting: Pulmonary Medicine

## 2024-02-13 DIAGNOSIS — Q620 Congenital hydronephrosis: Secondary | ICD-10-CM

## 2024-02-13 DIAGNOSIS — Z923 Personal history of irradiation: Secondary | ICD-10-CM

## 2024-02-13 DIAGNOSIS — Z960 Presence of urogenital implants: Secondary | ICD-10-CM | POA: Diagnosis present

## 2024-02-13 DIAGNOSIS — I5020 Unspecified systolic (congestive) heart failure: Secondary | ICD-10-CM | POA: Diagnosis present

## 2024-02-13 DIAGNOSIS — A419 Sepsis, unspecified organism: Principal | ICD-10-CM | POA: Diagnosis present

## 2024-02-13 DIAGNOSIS — R1312 Dysphagia, oropharyngeal phase: Secondary | ICD-10-CM | POA: Diagnosis present

## 2024-02-13 DIAGNOSIS — N3091 Cystitis, unspecified with hematuria: Secondary | ICD-10-CM | POA: Diagnosis present

## 2024-02-13 DIAGNOSIS — D630 Anemia in neoplastic disease: Secondary | ICD-10-CM | POA: Diagnosis present

## 2024-02-13 DIAGNOSIS — Z87442 Personal history of urinary calculi: Secondary | ICD-10-CM

## 2024-02-13 DIAGNOSIS — Z9079 Acquired absence of other genital organ(s): Secondary | ICD-10-CM

## 2024-02-13 DIAGNOSIS — R059 Cough, unspecified: Secondary | ICD-10-CM

## 2024-02-13 DIAGNOSIS — M79605 Pain in left leg: Secondary | ICD-10-CM

## 2024-02-13 DIAGNOSIS — I214 Non-ST elevation (NSTEMI) myocardial infarction: Secondary | ICD-10-CM | POA: Diagnosis not present

## 2024-02-13 DIAGNOSIS — J189 Pneumonia, unspecified organism: Secondary | ICD-10-CM | POA: Diagnosis present

## 2024-02-13 DIAGNOSIS — Z66 Do not resuscitate: Secondary | ICD-10-CM | POA: Diagnosis not present

## 2024-02-13 DIAGNOSIS — Z936 Other artificial openings of urinary tract status: Secondary | ICD-10-CM

## 2024-02-13 DIAGNOSIS — Z7969 Long term (current) use of other immunomodulators and immunosuppressants: Secondary | ICD-10-CM

## 2024-02-13 DIAGNOSIS — R262 Difficulty in walking, not elsewhere classified: Secondary | ICD-10-CM

## 2024-02-13 DIAGNOSIS — Z515 Encounter for palliative care: Secondary | ICD-10-CM

## 2024-02-13 DIAGNOSIS — Z9089 Acquired absence of other organs: Secondary | ICD-10-CM

## 2024-02-13 DIAGNOSIS — I10 Essential (primary) hypertension: Secondary | ICD-10-CM

## 2024-02-13 DIAGNOSIS — R6521 Severe sepsis with septic shock: Secondary | ICD-10-CM | POA: Diagnosis present

## 2024-02-13 DIAGNOSIS — E43 Unspecified severe protein-calorie malnutrition: Secondary | ICD-10-CM | POA: Diagnosis present

## 2024-02-13 DIAGNOSIS — C689 Malignant neoplasm of urinary organ, unspecified: Secondary | ICD-10-CM

## 2024-02-13 DIAGNOSIS — Z79899 Other long term (current) drug therapy: Secondary | ICD-10-CM

## 2024-02-13 DIAGNOSIS — J9601 Acute respiratory failure with hypoxia: Secondary | ICD-10-CM | POA: Diagnosis present

## 2024-02-13 DIAGNOSIS — C7951 Secondary malignant neoplasm of bone: Secondary | ICD-10-CM | POA: Diagnosis present

## 2024-02-13 DIAGNOSIS — N390 Urinary tract infection, site not specified: Secondary | ICD-10-CM

## 2024-02-13 DIAGNOSIS — G893 Neoplasm related pain (acute) (chronic): Secondary | ICD-10-CM | POA: Diagnosis present

## 2024-02-13 DIAGNOSIS — R682 Dry mouth, unspecified: Secondary | ICD-10-CM | POA: Diagnosis present

## 2024-02-13 DIAGNOSIS — I472 Ventricular tachycardia, unspecified (CMS-HCC): Secondary | ICD-10-CM | POA: Diagnosis not present

## 2024-02-13 DIAGNOSIS — Z789 Other specified health status: Secondary | ICD-10-CM

## 2024-02-13 DIAGNOSIS — I110 Hypertensive heart disease with heart failure: Secondary | ICD-10-CM | POA: Diagnosis present

## 2024-02-13 DIAGNOSIS — Z981 Arthrodesis status: Secondary | ICD-10-CM

## 2024-02-13 DIAGNOSIS — G629 Polyneuropathy, unspecified: Secondary | ICD-10-CM | POA: Diagnosis present

## 2024-02-13 DIAGNOSIS — N179 Acute kidney failure, unspecified: Secondary | ICD-10-CM | POA: Diagnosis not present

## 2024-02-13 DIAGNOSIS — Z95828 Presence of other vascular implants and grafts: Secondary | ICD-10-CM

## 2024-02-13 DIAGNOSIS — Z6823 Body mass index (BMI) 23.0-23.9, adult: Secondary | ICD-10-CM

## 2024-02-13 DIAGNOSIS — Z7901 Long term (current) use of anticoagulants: Secondary | ICD-10-CM

## 2024-02-13 DIAGNOSIS — F329 Major depressive disorder, single episode, unspecified: Secondary | ICD-10-CM | POA: Diagnosis present

## 2024-02-13 DIAGNOSIS — D649 Anemia, unspecified: Secondary | ICD-10-CM

## 2024-02-13 DIAGNOSIS — L89151 Pressure ulcer of sacral region, stage 1: Secondary | ICD-10-CM | POA: Diagnosis present

## 2024-02-13 DIAGNOSIS — M130 Polyarthritis, unspecified: Secondary | ICD-10-CM | POA: Diagnosis present

## 2024-02-13 DIAGNOSIS — J690 Pneumonitis due to inhalation of food and vomit: Secondary | ICD-10-CM | POA: Diagnosis present

## 2024-02-13 DIAGNOSIS — C679 Malignant neoplasm of bladder, unspecified: Secondary | ICD-10-CM | POA: Diagnosis present

## 2024-02-13 DIAGNOSIS — I493 Ventricular premature depolarization: Secondary | ICD-10-CM | POA: Diagnosis present

## 2024-02-13 DIAGNOSIS — Z905 Acquired absence of kidney: Secondary | ICD-10-CM

## 2024-02-13 DIAGNOSIS — C799 Secondary malignant neoplasm of unspecified site: Secondary | ICD-10-CM

## 2024-02-13 DIAGNOSIS — E870 Hyperosmolality and hypernatremia: Secondary | ICD-10-CM | POA: Diagnosis not present

## 2024-02-13 DIAGNOSIS — M109 Gout, unspecified: Secondary | ICD-10-CM | POA: Diagnosis present

## 2024-02-13 DIAGNOSIS — Z87440 Personal history of urinary (tract) infections: Secondary | ICD-10-CM

## 2024-02-13 DIAGNOSIS — M79604 Pain in right leg: Secondary | ICD-10-CM

## 2024-02-13 DIAGNOSIS — Z882 Allergy status to sulfonamides status: Secondary | ICD-10-CM

## 2024-02-13 DIAGNOSIS — Z8701 Personal history of pneumonia (recurrent): Secondary | ICD-10-CM

## 2024-02-13 LAB — URINALYSIS WITH CULTURE REFLEX, WHEN INDICATED
Bilirubin: NEGATIVE
Blood: NEGATIVE
Glucose: NEGATIVE
Ketones: NEGATIVE
Leuk Esterase: 250 Leu/uL — AB
Nitrite: NEGATIVE
Protein: NEGATIVE
Specific Gravity: 1.009 (ref 1.002–1.030)
Urobilinogen: NEGATIVE
pH: 6 (ref 5.0–8.0)

## 2024-02-13 LAB — CBC WITH DIFF, BLOOD
ANC-Automated: 5.2 10*3/uL (ref 1.6–7.0)
Abs Basophils: 0 10*3/uL (ref <0.2)
Abs Eosinophils: 0 10*3/uL (ref 0.0–0.5)
Abs Lymphs: 0.8 10*3/uL (ref 0.8–3.1)
Abs Monos: 0.4 10*3/uL (ref 0.2–0.8)
Basophils: 0.2 %
Eosinophils: 0.3 %
Hct: 28.8 % — ABNORMAL LOW (ref 40.0–50.0)
Hgb: 9.6 g/dL — ABNORMAL LOW (ref 13.7–17.5)
Imm Gran %: 0.5 % (ref <1)
Imm Gran Abs: 0 10*3/uL (ref <0.1)
Lymphocytes: 12.5 %
MCH: 29.9 pg (ref 26.0–32.0)
MCHC: 33.3 g/dL (ref 32.0–36.0)
MCV: 89.7 um3 (ref 79.0–95.0)
MPV: 8.9 fL — ABNORMAL LOW (ref 9.4–12.4)
Monocytes: 6.6 %
Plt Count: 230 10*3/uL (ref 140–370)
RBC: 3.21 10*6/uL — ABNORMAL LOW (ref 4.60–6.10)
RDW: 14.6 % — ABNORMAL HIGH (ref 12.0–14.0)
Segs: 79.9 %
WBC: 6.5 10*3/uL (ref 4.0–10.0)

## 2024-02-13 LAB — TROPONIN T GEN 5 W/REFLEX TO CK/CKMB
Troponin T Gen 5 w/Reflex CK/CKMB: 37 ng/L — ABNORMAL HIGH (ref <22)
Troponin T Gen 5: 37 ng/L — ABNORMAL HIGH (ref <22)
Troponin T Gen 5: 116 ng/L (ref <22)

## 2024-02-13 LAB — UPPER RESPIRATORY PATHOGEN NUCLEIC ACID DETECTION TEST
Adenovirus PCR, Nasopharyngeal: NOT DETECTED
Bordetella Parapertussis PCR, Nasopharyngeal: NOT DETECTED
Bordetella Pertussis PCR, Nasopharyngeal: NOT DETECTED
Chlamydia pneumoniae PCR, Nasopharyngeal: NOT DETECTED
Coronavirus 229E PCR, Nasopharyngeal: NOT DETECTED
Coronavirus HKU1 PCR, Nasopharyngeal: NOT DETECTED
Coronavirus NL63 PCR, Nasopharyngeal: NOT DETECTED
Coronavirus OC43 PCR, Nasopharyngeal: NOT DETECTED
Influenza A PCR, Nasopharyngeal: NOT DETECTED
Influenza B PCR, Nasopharyngeal: NOT DETECTED
Metapneumovirus PCR, Nasopharyngeal: NOT DETECTED
Mycoplasma pneumoniae PCR, Nasopharyngeal: NOT DETECTED
Parainfluenza 1 PCR, Nasopharyngeal: NOT DETECTED
Parainfluenza 2 PCR, Nasopharyngeal: NOT DETECTED
Parainfluenza 3 PCR, Nasopharyngeal: NOT DETECTED
Parainfluenza 4 PCR, Nasopharyngeal: NOT DETECTED
Respiratory Syncytial Virus (RSV) PCR, Nasopharyngeal: NOT DETECTED
Rhinovirus/Enterovirus PCR, Nasopharyngeal: NOT DETECTED
SARS-CoV-2 (COVID-19 PCR, Nasopharyngeal: NOT DETECTED

## 2024-02-13 LAB — CKMB+INDEX (NO CPK), BLOOD
CK-MB Index: 2.9 %
CK-MB Index: 4 %
CK-MB Index: 7.1 %
CK-MB: 1 ng/mL (ref 0–5)
CK-MB: 1 ng/mL (ref 0–5)
CK-MB: 2 ng/mL (ref 0–5)

## 2024-02-13 LAB — ECG 12-LEAD
ATRIAL RATE: 73 {beats}/min
ATRIAL RATE: 68 {beats}/min
ATRIAL RATE: 60 {beats}/min
P AXIS: 30 degrees
P AXIS: 46 degrees
P AXIS: 61 degrees
PR INTERVAL: 164 ms
PR INTERVAL: 198 ms
PR INTERVAL: 204 ms
QRS INTERVAL/DURATION: 98 ms
QRS INTERVAL/DURATION: 112 ms
QRS INTERVAL/DURATION: 100 ms
QT: 378 ms
QT: 406 ms
QT: 424 ms
QTc (Bazett): 416 ms
QTc (Bazett): 431 ms
QTc (Bazett): 448 ms
QTc (Fredericia): 403 ms
QTc (Fredericia): 423 ms
QTc (Fredericia): 440 ms
R AXIS: 60 degrees
R AXIS: 29 degrees
R AXIS: 49 degrees
T AXIS: -24 degrees
T AXIS: -21 degrees
T AXIS: -35 degrees
VENTRICULAR RATE: 73 {beats}/min
VENTRICULAR RATE: 68 {beats}/min
VENTRICULAR RATE: 67 {beats}/min

## 2024-02-13 LAB — VENOUS BLOOD GAS
BE, Ven: 8.6 mmol/L — ABNORMAL HIGH (ref <=1.2)
FIO2, Ven: 21 %
HCO3, Ven: 31 mmol/L — ABNORMAL HIGH (ref 25–30)
O2 Sat, Ven (Est): 73.7 %
Temp, Ven: 36.9 Cel
pCO2, Ven (T): 49 mm[Hg] (ref 40–52)
pCO2, Ven (Uncorr): 49 mm[Hg] (ref 40–52)
pH, Ven (T): 7.45 — ABNORMAL HIGH (ref 7.33–7.40)
pH, Ven (Uncorr): 7.45 — ABNORMAL HIGH (ref 7.33–7.40)
pO2, Ven (T): 37 mm[Hg] (ref 25–44)
pO2, Ven (Uncorr): 37 mm[Hg] (ref 25–44)

## 2024-02-13 LAB — COMPREHENSIVE METABOLIC PANEL, BLOOD
ALT (SGPT): 29 U/L (ref 0–41)
AST (SGOT): 34 U/L (ref 0–40)
Albumin: 2.6 g/dL — ABNORMAL LOW (ref 3.5–5.2)
Alkaline Phos: 115 U/L (ref 40–129)
Anion Gap: 8 mmol/L (ref 7–15)
BUN: 30 mg/dL — ABNORMAL HIGH (ref 8–23)
Bicarbonate: 29 mmol/L (ref 22–29)
Bilirubin, Tot: 0.71 mg/dL (ref <1.2)
Calcium: 9.8 mg/dL (ref 8.5–10.6)
Chloride: 98 mmol/L (ref 98–107)
Creatinine: 0.88 mg/dL (ref 0.67–1.17)
Glucose: 134 mg/dL — ABNORMAL HIGH (ref 70–99)
Potassium: 4.1 mmol/L (ref 3.5–5.1)
Sodium: 135 mmol/L — ABNORMAL LOW (ref 136–145)
Total Protein: 5.7 g/dL — ABNORMAL LOW (ref 6.0–8.0)
eGFR Based on CKD-EPI 2021 Equation: >60 mL/min/{1.73_m2}

## 2024-02-13 LAB — CPK-CREATINE PHOSPHOKINASE, BLOOD
CPK: 35 U/L (ref 0–175)
CPK: 25 U/L (ref 0–175)
CPK: 28 U/L (ref 0–175)

## 2024-02-13 LAB — LACTATE, BLOOD
Lactate: 1 mmol/L (ref 0.5–2.0)
Lactate: 0.9 mmol/L (ref 0.5–2.0)

## 2024-02-13 LAB — LIPASE, BLOOD: Lipase: 11 U/L — ABNORMAL LOW (ref 13–60)

## 2024-02-13 LAB — TROPONIN T GEN 5: Troponin T Gen 5: 235 ng/L (ref <22)

## 2024-02-13 LAB — GLUCOSE (POCT): Glucose (POCT): 138 mg/dL — ABNORMAL HIGH (ref 70–99)

## 2024-02-13 LAB — INFLUENZA A/B & SARS-COV-2 PCR COMBO FOR RAPID RESPONSE LAB
Influenza A PCR, RRL: NOT DETECTED
Influenza B PCR, RRL: NOT DETECTED
SARS-CoV-2 PCR, RRL: NOT DETECTED

## 2024-02-13 LAB — CANDIDA AURIS NUCLEIC ACID DETECTION TEST: Candida auris Nucleic Acid Detection Test: NOT DETECTED

## 2024-02-13 LAB — BILIRUBIN, DIR BLOOD: Bilirubin, Dir: 0.2 mg/dL (ref <=0.2)

## 2024-02-13 MED ORDER — DIPHENHYDRAMINE HCL 12.5 MG/5ML OR ELIX
25.0000 mg | ORAL_SOLUTION | ORAL | Status: DC | PRN
Start: 2024-02-13 — End: 2024-02-14

## 2024-02-13 MED ORDER — HYDROMORPHONE HCL 2 MG OR TABS
2.0000 mg | ORAL_TABLET | ORAL | Status: DC | PRN
Start: 2024-02-13 — End: 2024-02-14
  Filled 2024-02-13: qty 1

## 2024-02-13 MED ORDER — SODIUM CHLORIDE 0.9% TKO INFUSION
INTRAVENOUS | Status: DC | PRN
Start: 2024-02-13 — End: 2024-02-22

## 2024-02-13 MED ORDER — POLYETHYLENE GLYCOL 3350 OR PACK
17.0000 g | PACK | Freq: Every day | ORAL | Status: DC
Start: 2024-02-13 — End: 2024-02-14
  Administered 2024-02-13 – 2024-02-14 (×2): 17 g via ORAL
  Filled 2024-02-13 (×2): qty 1

## 2024-02-13 MED ORDER — LACTATED RINGERS IV SOLN
Freq: Once | INTRAVENOUS | Status: AC
Start: 2024-02-13 — End: 2024-02-13

## 2024-02-13 MED ORDER — NALOXONE HCL 0.4 MG/ML IJ SOLN
0.1000 mg | INTRAMUSCULAR | Status: AC | PRN
Start: 2024-02-13 — End: ?

## 2024-02-13 MED ORDER — ENOXAPARIN SODIUM 40 MG/0.4ML IJ SOSY
40.0000 mg | PREFILLED_SYRINGE | Freq: Every day | INTRAMUSCULAR | Status: DC
Start: 2024-02-13 — End: 2024-02-14
  Administered 2024-02-13 – 2024-02-14 (×2): 40 mg via SUBCUTANEOUS
  Filled 2024-02-13 (×2): qty 1

## 2024-02-13 MED ORDER — POTASSIUM CHLORIDE 10 MEQ/100ML IV SOLN
10.0000 meq | Freq: Once | INTRAVENOUS | Status: DC
Start: 2024-02-13 — End: 2024-02-13

## 2024-02-13 MED ORDER — DULOXETINE HCL 20 MG OR CPEP
20.0000 mg | ORAL_CAPSULE | Freq: Two times a day (BID) | ORAL | Status: DC
Start: 2024-02-13 — End: 2024-02-14
  Administered 2024-02-13 – 2024-02-14 (×3): 20 mg via ORAL
  Filled 2024-02-13 (×3): qty 1

## 2024-02-13 MED ORDER — SODIUM CHLORIDE 0.9 % IJ SOLN (CUSTOM)
3.0000 mL | INTRAMUSCULAR | Status: DC | PRN
Start: 2024-02-13 — End: 2024-02-22
  Administered 2024-02-14: 3 mL via INTRAVENOUS

## 2024-02-13 MED ORDER — AZITHROMYCIN 250 MG OR TABS
500.0000 mg | ORAL_TABLET | Freq: Every day | ORAL | Status: DC
Start: 2024-02-14 — End: 2024-02-16

## 2024-02-13 MED ORDER — THERA M PLUS OR TABS
1.0000 | ORAL_TABLET | Freq: Every day | ORAL | Status: DC
Start: 2024-02-13 — End: 2024-02-14
  Administered 2024-02-13: 1 via ORAL
  Filled 2024-02-13: qty 1

## 2024-02-13 MED ORDER — PROCHLORPERAZINE MALEATE 10 MG OR TABS
10.0000 mg | ORAL_TABLET | Freq: Four times a day (QID) | ORAL | Status: DC | PRN
Start: 2024-02-13 — End: 2024-02-14
  Administered 2024-02-13: 10 mg via ORAL
  Filled 2024-02-13: qty 1

## 2024-02-13 MED ORDER — BUSPIRONE HCL 10 MG OR TABS
20.0000 mg | ORAL_TABLET | Freq: Two times a day (BID) | ORAL | Status: DC
Start: 2024-02-13 — End: 2024-02-14
  Administered 2024-02-13 (×2): 20 mg via ORAL
  Filled 2024-02-13 (×2): qty 1
  Filled 2024-02-13: qty 2

## 2024-02-13 MED ORDER — TIZANIDINE HCL 4 MG OR TABS
2.0000 mg | ORAL_TABLET | Freq: Three times a day (TID) | ORAL | Status: DC | PRN
Start: 2024-02-13 — End: 2024-02-14
  Administered 2024-02-13 – 2024-02-14 (×2): 2 mg via ORAL
  Filled 2024-02-13 (×2): qty 1

## 2024-02-13 MED ORDER — NOREPINEPHRINE-SODIUM CHLORIDE 8-0.9 MG/250ML-% IV SOLN
0.0000 ug/min | INTRAVENOUS | Status: DC
Start: 2024-02-13 — End: 2024-02-17
  Administered 2024-02-13: 4 ug/min via INTRAVENOUS
  Administered 2024-02-13 (×2): 3 ug/min via INTRAVENOUS
  Administered 2024-02-13: 6 ug/min via INTRAVENOUS
  Administered 2024-02-13: 1 ug/min via INTRAVENOUS
  Administered 2024-02-13 (×2): 2 ug/min via INTRAVENOUS
  Administered 2024-02-13: 4 ug/min via INTRAVENOUS
  Administered 2024-02-13: 2 ug/min via INTRAVENOUS
  Administered 2024-02-13: 5 ug/min via INTRAVENOUS
  Administered 2024-02-13: 2 ug/min via INTRAVENOUS
  Administered 2024-02-13: 7 ug/min via INTRAVENOUS
  Administered 2024-02-13 (×2): 4 ug/min via INTRAVENOUS
  Administered 2024-02-13 – 2024-02-14 (×3): 3 ug/min via INTRAVENOUS
  Administered 2024-02-14: 5 ug/min via INTRAVENOUS
  Administered 2024-02-14: 1 ug/min via INTRAVENOUS
  Administered 2024-02-14: 4 ug/min via INTRAVENOUS
  Administered 2024-02-14: 3 ug/min via INTRAVENOUS
  Administered 2024-02-14: 1 ug/min via INTRAVENOUS
  Administered 2024-02-14 (×2): 5 ug/min via INTRAVENOUS
  Administered 2024-02-14: 4 ug/min via INTRAVENOUS
  Administered 2024-02-14: 3 ug/min via INTRAVENOUS
  Administered 2024-02-14: 5 ug/min via INTRAVENOUS
  Administered 2024-02-14: 4 ug/min via INTRAVENOUS
  Administered 2024-02-14: 5 ug/min via INTRAVENOUS
  Administered 2024-02-14: 1 ug/min via INTRAVENOUS
  Administered 2024-02-14: 5 ug/min via INTRAVENOUS
  Administered 2024-02-14: 4 ug/min via INTRAVENOUS
  Administered 2024-02-14 (×2): 5 ug/min via INTRAVENOUS
  Administered 2024-02-14: 4 ug/min via INTRAVENOUS
  Administered 2024-02-14: 5 ug/min via INTRAVENOUS
  Administered 2024-02-14 (×2): 3 ug/min via INTRAVENOUS
  Administered 2024-02-14: 5 ug/min via INTRAVENOUS
  Administered 2024-02-14: 2 ug/min via INTRAVENOUS
  Administered 2024-02-14: 3 ug/min via INTRAVENOUS
  Administered 2024-02-14 (×2): 5 ug/min via INTRAVENOUS
  Administered 2024-02-15: 3 ug/min via INTRAVENOUS
  Administered 2024-02-15: 1 ug/min via INTRAVENOUS
  Administered 2024-02-15 (×2): 4 ug/min via INTRAVENOUS
  Administered 2024-02-15: 1 ug/min via INTRAVENOUS
  Administered 2024-02-15: 3 ug/min via INTRAVENOUS
  Administered 2024-02-15: 5 ug/min via INTRAVENOUS
  Administered 2024-02-15: 1 ug/min via INTRAVENOUS
  Administered 2024-02-15: 3 ug/min via INTRAVENOUS
  Administered 2024-02-15: 4 ug/min via INTRAVENOUS
  Administered 2024-02-15: 10 ug/min via INTRAVENOUS
  Administered 2024-02-15: 1 ug/min via INTRAVENOUS
  Administered 2024-02-15: 12 ug/min via INTRAVENOUS
  Administered 2024-02-15 (×2): 3 ug/min via INTRAVENOUS
  Administered 2024-02-15: 1 ug/min via INTRAVENOUS
  Administered 2024-02-15: 4 ug/min via INTRAVENOUS
  Administered 2024-02-15 (×2): 5 ug/min via INTRAVENOUS
  Administered 2024-02-15: 3 ug/min via INTRAVENOUS
  Administered 2024-02-15: 5 ug/min via INTRAVENOUS
  Administered 2024-02-15: 1 ug/min via INTRAVENOUS
  Administered 2024-02-15 (×2): 3 ug/min via INTRAVENOUS
  Administered 2024-02-15: 1 ug/min via INTRAVENOUS
  Administered 2024-02-15: 3 ug/min via INTRAVENOUS
  Administered 2024-02-15: 6 ug/min via INTRAVENOUS
  Administered 2024-02-15: 4 ug/min via INTRAVENOUS
  Administered 2024-02-15: 1 ug/min via INTRAVENOUS
  Administered 2024-02-15: 5 ug/min via INTRAVENOUS
  Administered 2024-02-15: 3 ug/min via INTRAVENOUS
  Administered 2024-02-15: 4 ug/min via INTRAVENOUS
  Administered 2024-02-15: 5 ug/min via INTRAVENOUS
  Administered 2024-02-16: 4 ug/min via INTRAVENOUS
  Administered 2024-02-16: 3 ug/min via INTRAVENOUS
  Administered 2024-02-16: 6 ug/min via INTRAVENOUS
  Administered 2024-02-16: 4 ug/min via INTRAVENOUS
  Administered 2024-02-16: 6 ug/min via INTRAVENOUS
  Administered 2024-02-16: 8 ug/min via INTRAVENOUS
  Administered 2024-02-16: 4 ug/min via INTRAVENOUS
  Administered 2024-02-16: 8 ug/min via INTRAVENOUS
  Administered 2024-02-16: 4 ug/min via INTRAVENOUS
  Administered 2024-02-16: 5 ug/min via INTRAVENOUS
  Administered 2024-02-16: 8 ug/min via INTRAVENOUS
  Administered 2024-02-16: 4 ug/min via INTRAVENOUS
  Administered 2024-02-16: 1 ug/min via INTRAVENOUS
  Administered 2024-02-16: 2 ug/min via INTRAVENOUS
  Administered 2024-02-16: 3 ug/min via INTRAVENOUS
  Administered 2024-02-16: 4 ug/min via INTRAVENOUS
  Administered 2024-02-16 (×2): 8 ug/min via INTRAVENOUS
  Administered 2024-02-16: 6 ug/min via INTRAVENOUS
  Administered 2024-02-16: 3 ug/min via INTRAVENOUS
  Administered 2024-02-16: 6 ug/min via INTRAVENOUS
  Administered 2024-02-16: 4 ug/min via INTRAVENOUS
  Administered 2024-02-16: 2 ug/min via INTRAVENOUS
  Administered 2024-02-16: 4 ug/min via INTRAVENOUS
  Administered 2024-02-16: 2 ug/min via INTRAVENOUS
  Administered 2024-02-16 (×2): 3 ug/min via INTRAVENOUS
  Administered 2024-02-16: 4 ug/min via INTRAVENOUS
  Administered 2024-02-16: 5 ug/min via INTRAVENOUS
  Administered 2024-02-16: 4 ug/min via INTRAVENOUS
  Administered 2024-02-16: 2 ug/min via INTRAVENOUS
  Administered 2024-02-16: 3 ug/min via INTRAVENOUS
  Administered 2024-02-16: 2 ug/min via INTRAVENOUS
  Administered 2024-02-16: 4 ug/min via INTRAVENOUS
  Administered 2024-02-16: 3 ug/min via INTRAVENOUS
  Administered 2024-02-16: 4 ug/min via INTRAVENOUS
  Administered 2024-02-16: 3 ug/min via INTRAVENOUS
  Administered 2024-02-16: 4 ug/min via INTRAVENOUS
  Administered 2024-02-16: 3 ug/min via INTRAVENOUS
  Administered 2024-02-16: 2 ug/min via INTRAVENOUS
  Administered 2024-02-16: 3 ug/min via INTRAVENOUS
  Administered 2024-02-17: 1 ug/min via INTRAVENOUS
  Administered 2024-02-17 (×3): 2 ug/min via INTRAVENOUS
  Administered 2024-02-17: 1 ug/min via INTRAVENOUS
  Administered 2024-02-17: 3 ug/min via INTRAVENOUS
  Administered 2024-02-17: 1 ug/min via INTRAVENOUS
  Administered 2024-02-17 (×2): 2 ug/min via INTRAVENOUS
  Administered 2024-02-17 (×2): 1 ug/min via INTRAVENOUS
  Administered 2024-02-17: 2 ug/min via INTRAVENOUS
  Administered 2024-02-17 (×2): 3 ug/min via INTRAVENOUS
  Administered 2024-02-17 (×3): 2 ug/min via INTRAVENOUS
  Filled 2024-02-13 (×4): qty 250

## 2024-02-13 MED ORDER — VANCOMYCIN HCL 1.5 G IV SOLR
1500.0000 mg | Freq: Once | INTRAVENOUS | Status: AC
Start: 2024-02-13 — End: 2024-02-13
  Administered 2024-02-13: 1500 mg via INTRAVENOUS
  Filled 2024-02-13: qty 1500

## 2024-02-13 MED ORDER — ALLOPURINOL 100 MG OR TABS
100.0000 mg | ORAL_TABLET | Freq: Every day | ORAL | Status: DC
Start: 2024-02-13 — End: 2024-02-14
  Administered 2024-02-13: 100 mg via ORAL
  Filled 2024-02-13: qty 1

## 2024-02-13 MED ORDER — FLUTICASONE PROPIONATE 50 MCG/ACT NA SUSP
2.0000 | Freq: Every day | NASAL | Status: AC
Start: 2024-02-13 — End: ?
  Administered 2024-02-13 – 2024-02-22 (×10): 2 via NASAL
  Filled 2024-02-13 (×2): qty 16

## 2024-02-13 MED ORDER — ERDAFITINIB 3 MG PO TABS
6.0000 mg | ORAL_TABLET | Freq: Every day | ORAL | Status: DC
Start: 2024-02-13 — End: 2024-02-18

## 2024-02-13 MED ORDER — SODIUM CHLORIDE 0.9 % IV SOLN
4500.0000 mg | Freq: Three times a day (TID) | INTRAVENOUS | Status: AC
Start: 2024-02-13 — End: ?
  Administered 2024-02-13 – 2024-02-22 (×27): 4500 mg via INTRAVENOUS
  Filled 2024-02-13 (×27): qty 4500

## 2024-02-13 MED ORDER — SODIUM CHLORIDE 0.9 % IV SOLN
4500.0000 mg | Freq: Once | INTRAVENOUS | Status: AC
Start: 2024-02-13 — End: 2024-02-13
  Administered 2024-02-13: 4500 mg via INTRAVENOUS
  Filled 2024-02-13: qty 4500

## 2024-02-13 MED ORDER — HYDROMORPHONE HCL 1 MG/ML IJ SOLN
1.0000 mg | INTRAMUSCULAR | Status: AC | PRN
Start: 2024-02-13 — End: 2024-02-14
  Administered 2024-02-13 – 2024-02-14 (×5): 1 mg via INTRAVENOUS
  Filled 2024-02-13 (×5): qty 1

## 2024-02-13 MED ORDER — POLYVINYL ALCOHOL-POVIDONE PF 1.4-0.6 % OP SOLN
2.0000 [drp] | Freq: Two times a day (BID) | OPHTHALMIC | Status: AC | PRN
Start: 2024-02-13 — End: ?
  Administered 2024-02-17 – 2024-02-21 (×3): 2 [drp] via OPHTHALMIC
  Filled 2024-02-13 (×3): qty 1

## 2024-02-13 MED ORDER — SODIUM CHLORIDE 0.9 % IJ SOLN (CUSTOM)
3.0000 mL | Freq: Three times a day (TID) | INTRAMUSCULAR | Status: DC
Start: 2024-02-13 — End: 2024-02-22
  Administered 2024-02-13 – 2024-02-22 (×15): 3 mL via INTRAVENOUS

## 2024-02-13 MED ORDER — STERILE WATER FOR INJECTION IJ SOLN
1000.0000 mg | Freq: Once | INTRAMUSCULAR | Status: AC
Start: 2024-02-13 — End: 2024-02-13
  Administered 2024-02-13: 1000 mg via INTRAVENOUS
  Filled 2024-02-13: qty 1000

## 2024-02-13 MED ORDER — VANCOMYCIN PER PHARMACY
INTRAVENOUS | Status: DC
Start: 2024-02-13 — End: 2024-02-13

## 2024-02-13 MED ORDER — GABAPENTIN 300 MG OR CAPS
300.0000 mg | ORAL_CAPSULE | Freq: Three times a day (TID) | ORAL | Status: DC
Start: 2024-02-13 — End: 2024-02-14
  Administered 2024-02-13 – 2024-02-14 (×4): 300 mg via ORAL
  Filled 2024-02-13 (×4): qty 1

## 2024-02-13 MED ORDER — SODIUM CHLORIDE 0.9 % IV SOLN
500.0000 mg | Freq: Once | INTRAVENOUS | Status: AC
Start: 2024-02-13 — End: 2024-02-13
  Administered 2024-02-13: 500 mg via INTRAVENOUS
  Filled 2024-02-13: qty 500

## 2024-02-13 MED ORDER — TRAZODONE HCL 50 MG OR TABS
50.0000 mg | ORAL_TABLET | Freq: Every evening | ORAL | Status: DC | PRN
Start: 2024-02-13 — End: 2024-02-14
  Administered 2024-02-13: 50 mg via ORAL
  Filled 2024-02-13: qty 1

## 2024-02-13 MED ORDER — VANCOMYCIN HCL 1 GM IV SOLR
1000.0000 mg | Freq: Two times a day (BID) | INTRAVENOUS | Status: DC
Start: 2024-02-13 — End: 2024-02-16
  Administered 2024-02-13 – 2024-02-16 (×6): 1000 mg via INTRAVENOUS
  Filled 2024-02-13 (×6): qty 1000

## 2024-02-13 MED ORDER — SENNA 8.6 MG OR TABS
17.2000 mg | ORAL_TABLET | Freq: Every evening | ORAL | Status: DC
Start: 2024-02-13 — End: 2024-02-14
  Administered 2024-02-13: 17.2 mg via ORAL
  Filled 2024-02-13: qty 2

## 2024-02-13 NOTE — Interdisciplinary (Signed)
 Occupational Therapy Evaluation    Admitting Physician:  Granville Nathalie, MD  Admission Date 02/13/2024    Inpatient Diagnosis:   Problem List         Codes    Sepsis, due to unspecified organism, unspecified whether acute organ dysfunction present    -  Primary ICD-10-CM: A41.9  ICD-9-CM: 038.9, 995.91    Decreased activities of daily living (ADL)     ICD-10-CM: Z78.9  ICD-9-CM: V49.89            IP Start of Service  Start of Care: 02/13/24  Reason for referral: Activity tolerance limitation;Decline in functional ability/mobility;Decline in performance of activities of daily living (ADL)    Preferred Language:English         Past Medical History:   Diagnosis Date    Chronic back pain     Congenital hydronephrosis     Gout     Headache     Hematuria     HTN (hypertension) 12/10/2021    Kidney disease     Kidney stones     Major depressive disorder, single episode     Polyarthropathy or polyarthritis of multiple sites     Retinal detachment     Urethral stricture       Past Surgical History:   Procedure Laterality Date    CT INSERTION OF SUPRAPUBIC CATH  09/25/2015    NEPHRECTOMY Right 1955    APPENDECTOMY      COLONOSCOPY      CYSTOSCOPY      CYSTOSCOPY W/ LASER LITHOTRIPSY      OTHER SURGICAL HISTORY      Interstim 01/29/2011    SPINE SURGERY  09/21    Lumbar-sacral fusion    TRANSURETHRAL RESECTION OF PROSTATE          OT Acute       Row Name 02/13/24 1200          Type of Visit    Type of Occupational Therapy note Occupational Therapy Evaluation       Row Name 02/13/24 1200          Treatment Time    Treatment Start Time 0900     Total TIMED Treatment (min) 45     Total Treatment Time (min) 60       Row Name 02/13/24 1200          Treatment Precautions/Restrictions    Precautions/Restrictions Fall;Multiple lines     Fall Socks/charm       Row Name 02/13/24 1200          Medical History    History of presenting condition "Stephen [name] is a 73 year old male w/ hx of 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 Overall appears well on exam with appropriate mentation but having ongoing fluctuation in BP requiring vasopressor support, suspect from sepsis."     Fall history No falls reported in the last 6 months       Row Name 02/13/24 1200          Functional History    Prior Level of Function Minimal deficits     General ADL/Self-Care Assistance Needs Needed assist with ADLs and self care     Equipment required for mobility in the home Walker     Other Functional History Information Pt d/c 1/22, was supervision level. Pt  reports was IND prior to admission in Jan 2025. Reports has participated minimally in mobility while at SNF.       Row Name 02/13/24 1200          Social History    Living Situation Lives with family     Home Environment House     Home accessibility Performs activities of daily living (ADL's) on one level     Other Social History Information Pt admitted from SNF. Was previously living in [facility] with sister who works from home       Row Name 02/13/24 1200          Subjective    Patient status Patient agreeable to treatment;Nursing in agreement for treatment       Row Name 02/13/24 1200          Pain Assessment    Pain Asssessment Tool Numeric Pain Rating Scale       Row Name 02/13/24 1200          Numeric Pain Rating Scale    Pain Intensity - rating at present 10     Pain Intensity- rating after treatment 10     Location lower back/ BLEs       Row Name 02/13/24 1200          Activities of Daily Living (ADLs)    Self Feeding Minimum assistance (25% assistance)     Other Self Feeding Information Med management, assist with small pills 2/2 neuropathy     Self Grooming Supervised     Other Self Grooming Information Seated GH     Toilet Transfers Moderate assistance (25-50% assistance)     Other Toilet Transfers Information STS from EOB with FWW       Row  Name 02/13/24 1200          [place] AM-PAC: Daily Activity    Assistance Needed to Put on and Take off Regular Lower Body Clothing 2     Assistance Needed to Bathe, Including Washing, Rinsing, and Drying 2     Assistance Needed to Toilet Agricultural consultant, Bedpan, or Urinal) 2     Assistance Needed to Put on and Take off Regular Upper Body Clothing 3     Assistance Needed to Take Care of Personal Grooming Such as Brushing Teeth 4     Assistance Needed to Eat Meals 4     AM-PAC Daily Activity Total Score 17     AMP-PAC Daily Activity Impairment rating Score 14-19 - 40-59% impaired       Row Name 02/13/24 1200          Objective    Overall Cognitive Status Intact - no cognitive limitations or impairments noted     Communication No communication limitations or impairments noted. Current status of hearing, speech and vision allow functional communication.     Coordination/Motor control Other (comment)     Coordination/Motor Control  Information Neuropathy in fingers     Balance Balance limitations present     Static Sitting Balance Good - able to maintain balance without handhold support, limited postural sway     Dynamic Sitting Balance Good - accepts moderate challenge, able to maintain balance while picking object off floor     Static Standing Balance Fair - able to maintain balance with handhold support, may require occasional minimal assistance     Dynamic Standing Balance Fair - accepts minimal challenge, able to maintain balance while turning head/trunk     Other Balance Information  SBA sitting balance, MIN standing balance with decreased hip extension     Extremity Assessment Range of motion, strength,  muscle tone and/or sensation limitations present     LUE findings Grossly 4-/5     RUE findings Grossly 4-/5     LLE findings Defer to PT     RLE findings Defer to PT     Functional Mobility Functional mobility deficits present     Bed Mobility Dependent/total assistance ( >75% assistance)     Bed Mobility Comments MAX Ax2  sup<>sit     Transfers to/from Stand Moderate assistance (25-50% assistance)     Transfer Comments STS from EOB with FWW     Ambulation during functional tasks Moderate assistance (25-50% assistance)     Device used for ambulation/mobility Front wheeled walker     Ambulation Distance side step towards HOB     Other Objective Findings Pt received in supine, agreeable to tx. Occupational profile collected. RN present throughout to manage hemodynamic stability. Pt assisted to sit EOB to complete [facility] and med management tasks. Pt assisted to stand and side step towards HOB. Decreased hip extension in standing. Pt returned to supine, left with RN.     Vitals:  Before activity - Supine, 101/58  During activity - Sitting, MAP 58-80  After activity - Supine, 92/57 (68)                    OT Acute Tool Box       Row Name 02/13/24 1200          Cognition Assessment    Overall Cognitive Status Intact - no cognitive limitations or impairments noted                        Eval cont.       Row Name 02/13/24 1200          Patient/Family Education    Learner(s) Patient     Learner response to rehab patient education interventions Verbalizes understanding;Able to return demonstrate teaching     Patient/family training comments OT role/POC, recommendations, safety       Row Name 02/13/24 1200          Assessment    Assessment Pt seen for OT EVAL, tolerated session well. Pt functioning below baseline, req MIN-MAX A for functional mobility and ADLs. Limitations include generalized weakness, decreased activity tolerance, and pain.     Will benefit from continued skilled IPOT to promote ADL participation and safe d/c.     POST ACUTE REHABILITATION DISCHARGE RECOMMENDATIONS:    Recommended Post-Acute Therapy Follow up:   Ongoing Skilled Therapy after Discharge    If available, recommend discharge to a setting with the following assistance for the listed deficits:     Deficits :   All mobility related ADLs assist level: Maximum (50-75%  assistance)  Toileting assist level: Maximum (50-75% assistance)  Bathing assist level: Moderate (25-50% assistance)  Upper Body Dressing assist level: Minimum (25% or less assistance)  Lower Body Dressing assist level: Maximum (50-75% assistance)    Equipment recommendations:   To be determined as patient progresses in therapy       Rehab Potential Good       Row Name 02/13/24 1200          Goal 1 (Short Term)    Impairment Functional ability/mobility - Standing activities     Functional ability/mobility - Standing activities Patient will demonstrate the  ability to perform /complete grooming and hygiene in standing position at modified independent/independent level     Custom goal SUP     Number of visits 5     Goal Status New       Row Name 02/13/24 1200          Goal 2 (Short Term)    Impairment Activities of Daily Living - Lower Body Dressing     Activities of Daily Living - Lower Body Dressing Patient able to perform and complete lower body dressing at modified independent/independent level     Custom goal LHAE PRN SUP     Number of visits 5     Goal Status New       Row Name 02/13/24 1200          Goal 3 (Short Term)    Impairment Functional ability/mobility - Toileting     Functional ability/mobility - Toileting Patient demonstrates the ability to perform and complete toilet transfers at modified independent/independent level     Custom goal SUP     Number of visits 5     Goal Status New       Row Name 02/13/24 1200          Planned Therapy Interventions and Rationale    Patient Education to improve energy conservation measures during functional activities;to increase independence in functional activities;to increase safety during dynamic activities     Self-Care/ADL Training to improve independence with adaptive equipment;to improve independence with compensatory strategies;to improve energy conservation measures during functional activities;to improve home safety;to improve safety while using an assistive  device;to improve safety when completing daily activities and self care     Therapeutic Activities to improve ability to perform self care and ADL's;to restore functional performace using graded activities;to improve transfers between surfaces     Therapeutic Exercise to improve activity tolerance to allow greater independence with self care and ADL's;to increase strength to allow greater independence with self care and ADL's       Row Name 02/13/24 1200          Treatment Plan Disussion    Treatment Plan Discussion and Agreement Patient support system determined and all questions were asked and answered;Patient/family/caregiver stated understanding and agreement with the therapy plan       Row Name 02/13/24 1200          Treatment Plan    Continue therapy to address Activity tolerance limitation;Decline in functional ability/mobility;Decline in performance of activities of daily living (ADL)     Frequency of treatment 4 times per week     Duration of treatment (number of visits) Treatment will continue while in hospital and in need of skilled therapy services     Status of treatment Patient evaluated and will benefit from ongoing skilled therapy       Row Name 02/13/24 1200          Patient Safety Considerations     Patient safety considerations Patient returned to bed at end of treatment;Call light left in reach and fall precautions in place;Patient may be at risk for falls;Nursing notified of safety considerations at end of treatment     Patient assistive device requirements for safe ambulation Walker;Wheelchair       Row Name 02/13/24 1200          Therapy Plan Communication    Therapy Plan Communication Discussed therapy plan with Nursing and/or Physician;Encouraged out of bed with assistance by     Encouraged  out of bed with assistance by Nursing;Staff       Row Name 02/13/24 1200          Occupational Therapy Patient Discharge Instructions    Your Occupational Therapist suggests the following Continue to  complete your self care Activities of Daily Living as frequently as possible;Continue to use energy conservation, pursed lip breathing and self-pacing when completing your self care Activities of Daily Living       Row Name 02/13/24 1200          Type of Eval    Low Complexity [phone]) Completed       Row Name 02/13/24 1200          Therapeutic Procedures    Therapeutic Activities (97530) Assistance/facilitation of bed mobility;Dynamic activities to improve performance of functional tasks/activities;Facilitation of safety awareness/responses during functional tasks;Functional activities;Graded physical assist for functional activities;Patient education;Progressive mobilization to improve functional independence;Transfer training with weight shift and direction change;Weight shift activities to improve safety in unsupported sitting or standing         Total TIMED Treatment (min) 30     Self-Care/ADL Training (32440) Activities of daily living training;Grooming;Patient education;Personal hygiene;Safety procedures;Self-care activities of dally living         Total TIMED Treatment (min) 15                     The occupational therapist of record is endorsed by evaluating occupational therapist.

## 2024-02-13 NOTE — H&P (Signed)
 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 He has been residing at the [facility] and developed a productive cough 1-2 weeks ago, reportedly had CXR done showing pneumonia and was given amoxicillin. He does not know if he finished the course of antibiotics. His cough has since improved and is now an intermittent dry cough. Denies overt dyspnea, chest pain, palpitations. Denies nausea/vomiting. No fevers, chills, rigors. He reports being woken up at his SNF today for low blood pressure but denies syncope, presyncopal symptoms. Patient recently admitted to [facility] 1/14-1/22 for worsening back pain thought to be cancer related pain - this is still ongoing.    ROS  14-point ROS completed and negative other than above.    Home Meds:   Prior to Admission medications    Medication Sig Start Date End Date Taking? Authorizing Provider   allopurinol (ZYLOPRIM) 100 MG tablet Take 1 tablet (100 mg) by mouth daily. 01/19/22   Miles Costain, MD   aluminum-magnesium-simethicone (MAG-AL PLUS) 200-200-20 MG/5ML suspension Mix viscous lidocaine 2%, Mylanta, and Benadryl liquid into a 1:1:1 solution.  Take 10mL by mouth every 4 hours as needed for pain.  Swish and spit or swallow for pain. If swallowed, wait 10 minutes prior to eating/drinking. May also apply medication to a Q-tip and dab on areas of sores. 10/09/23   Shea Evans, MD   Artificial Tear Solution (SOOTHE XP OP)  04/14/20   Historical, Documentation   busPIRone (BUSPAR) 10 MG tablet Take 2 tablets (20 mg) by mouth 2 times daily. 10/18/23   Horton Finer, NP   diphenhydrAMINE (BENADRYL) 12.5 MG/5ML liquid Mix  viscous lidocaine 2%, Mylanta, and Benadryl liquid into a 1:1:1 solution.  Take 10mL by mouth every 4 hours as needed for pain.  Swish and spit or swallow for pain. If swallowed, wait 10 minutes prior to eating/drinking. May also apply medication to a Q-tip and dab on areas of sores. 11/06/23   Shea Evans, MD   DULoxetine (CYMBALTA) 20 MG CR capsule Take 1 capsule (20 mg) by mouth 2 times daily. 12/11/23   Shea Evans, MD   Erdafitinib 3 MG TABS Take 6 mg by mouth daily. 12/08/23   Cheri Guppy, MD   fluticasone propionate [place]) 50 MCG/ACT nasal spray Spray 2 sprays into each nostril daily. 11/06/23   Shea Evans, MD   gabapentin (NEURONTIN) 300 MG capsule Take 1 capsule (300 mg) by mouth 3 times daily. 12/25/23   Shea Evans, MD   HYDROmorphone (DILAUDID) 2 MG tablet Take 1 to 2 tablets (2mg  to 4mg ) by mouth every 4 hours as needed for moderate to severe pain 12/25/23   Shea Evans, MD   ketotifen (ALAWAY) 0.025 % ophthalmic solution Place 1 drop into both eyes 2 times daily.    Historical, Documentation   lidocaine (XYLOCAINE) 2 % solution Mix viscous lidocaine 2%, Mylanta, and Benadryl liquid into a 1:1:1 solution.  Take 10mL by mouth every 4 hours as needed for pain.  Swish and spit or swallow for pain. If swallowed, wait 10 minutes  prior to eating/drinking. May also apply medication to a Q-tip and dab on areas of sores. 11/06/23   Shea Evans, MD   Multiple Vitamins-Minerals (MULTIVITAMIN WITH MINERALS) TABS tablet Take 1 tablet by mouth daily. 01/20/22   Miles Costain, MD   naloxone (NARCAN) 4 mg/0.1 mL nasal spray For suspected opioid overdose, call 911! Then spray once in one nostril. Repeat after 3 minutes if no or minimal response using a new spray in other nostril. 01/23/23   Shea Evans, MD   Nutritional Supplements (NEPRO) LIQD Take 3 Cans by mouth daily. 02/28/23   Agapito Games, PA   nystatin (MYCOSTATIN) 100,000 units/mL suspension  5 mL (500,000 Units) by Swish & Swallow route 4 times daily. 01/11/24   Shea Evans, MD   pembrolizumab [facility]) 100 MG/4ML SOLN     Historical, Documentation   polyethylene glycol (GLYCOLAX) 17 GM/SCOOP powder Take 17 grams (1 capful) by mouth daily. Mix with 4 to 8 ounces of fluid (water, juice, soda, coffee, or tea) prior to administration. 12/04/23   Ronette Deter, MD   prochlorperazine (COMPAZINE) 10 MG tablet Take 1 tablet (10 mg) by mouth every 6 hours as needed (Nausea/Vomiting). 11/06/23   Shea Evans, MD   senna (SENOKOT) 8.6 MG tablet Take 2 tablets (17.2 mg) by mouth at bedtime. 12/04/23   Ronette Deter, MD   tizanidine (ZANAFLEX) 2 MG tablet Take 1 tablet (2 mg) by mouth every 8 hours as needed (muscle spasm). 01/11/24   Shea Evans, MD   traZODone (DESYREL) 50 MG tablet Take one tab nightly by mouth at bedtime as needed for insomnia 10/24/23   Horton Finer, NP       Pertinent Meds:    azithromycin [facility]) IVPB  500 mg Once    potassium chloride  10 mEq Once        norepinephrine 8 mg in sodium chloride 0.9% 250 mL  0-50 mcg/min 9.4 mL/hr at 02/13/24 0431 5 mcg/min at 02/13/24 0431       Allergies:   Allergies   Allergen Reactions    Sulfa Drugs Hives, Other and Unspecified       Past Medical  Past Medical History:   Diagnosis Date    Chronic back pain     Congenital hydronephrosis     Gout     Headache     Hematuria     HTN (hypertension) 12/10/2021    Kidney disease     Kidney stones     Major depressive disorder, single episode     Polyarthropathy or polyarthritis of multiple sites     Retinal detachment     Urethral stricture        Surgical Hx    Past Surgical History:   Procedure Laterality Date    CT INSERTION OF SUPRAPUBIC CATH  09/25/2015    NEPHRECTOMY Right 1955    APPENDECTOMY      COLONOSCOPY      CYSTOSCOPY      CYSTOSCOPY W/ LASER LITHOTRIPSY      OTHER SURGICAL HISTORY      Interstim 01/29/2011    SPINE SURGERY  09/21    Lumbar-sacral fusion     TRANSURETHRAL RESECTION OF PROSTATE         Family Hx  Family History   Adopted: Yes   Family history unknown: Yes       Social Hx  Social History     Tobacco Use  Smoking status: Never     Passive exposure: Never    Smokeless tobacco: Never   Substance Use Topics    Alcohol use: No     Objective  Vitals: Reviewed.    Physical Exam  GEN: elderly appearing, NAD  HEENT: EOMI, dry mucous membranes   CARDS: bradycardic, occasional PVCs  RESP: CTAB, no wheezes/rales  ABD: Soft, NT/ND, NABS  MSK/EXT: WWP, trace pitting edema L>R   NEURO: 4/5 strength LE b/l, intact sensation  PSYCH: Normal affect    Pertinent Labs: Reviewed.  Na 135* (02/25) CL 98 (02/25) BUN 30* (02/25) GLU   134* (02/25)   K 4.1 (02/25) CO2 29 (02/25) Cr 0.88 (02/25)      WBC 6.5 (02/25) HGB 9.6* (02/25) PLT 230 (02/25)    HCT 28.8* (02/25)      Micro Data:   Lab Results   Component Value Date    BLOODCULT (A) 01/12/2022     Enterococcus faecalis  Anaerobic Bottle:  For susceptibility results refer to culture #[id number]  Collected date:01/10/22  Collected time:1558  .  Identification performed by [employer]( [facility]). This test  was developed and its performance characteristics determined by [facility]. It has not been cleared or  approved by the U.S. Food and Drug Administration.  The FDA has  determined that such clearance or approval is not necessary.      URINECULTURE (A) 11/28/2023     Enterococcus faecalis  >100,000 CFU/mL  Identification performed by [employer]( [facility]). This test  was developed and its performance characteristics determined by [facility]. It has not been cleared or  approved by the U.S. Food and Drug Administration.  The FDA has  determined that such clearance or approval is not necessary.      URINECULTURE (A) 11/28/2023     Gram-negative bacillus  50,000 - 100,000 CFU/mL  (Mixta intestinalis)  .  Identification performed by [employer](  [facility]). This test  was developed and its performance characteristics determined by [facility]. It has not been cleared or  approved by the U.S. Food and Drug Administration.  The FDA has  determined that such clearance or approval is not necessary.  .  Susceptibility testing performed at:  [facility]  [phone], [email or web address]   [address]   [place], [place] [address]  Evans Lance, MD, PhD, Chief Medical Officer         Results for orders placed or performed in visit on 09/21/23   HIV 1/2 Antibody & P24 Antigen Assay   Result Value Ref Range    HIV 1/2 Antibody & P24 Antigen Assay Non Reactive      *Note: Due to a large number of results and/or encounters for the requested time period, some results have not been displayed. A complete set of results can be found in Results Review.       Pertinent Imaging: Reviewed.  2/25 CXR     Unchanged positioning of the right IJ MediPort with the tip projecting over the mid SVC.     The cardiac silhouette is within normal limits     Interval development of right perihilar ill-defined opacities, given the provided history, concerning for pneumonia. No sizable pleural effusion or pneumothorax.     The visualized osseous structures demonstrate no acute abnormality. Degenerative changes of the spine.    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 Overall appears well on exam with appropriate mentation but having ongoing fluctuation in BP requiring vasopressor support, suspect from sepsis.     # Septic shock   - bcx, urine culture pending   - s/p 2L IVF in ED, fluctuating BPs   - low dose levophed to maintain MAPs > 65  - s/p ceftriaxone/azithro in ED, broaden to zosyn/azithromycin for now    #AHRF    #Pneumonia  - CXR w/ worsening R opacities, previously tried amoxicillin without improvement   - Flu/COVID negative   - RPNA pending   - consider CT chest if no improvement    # Metastatic urothelial carcinoma   # Frequent UTI   # L nephrostomy tube placement   - cont. Erdatfitnib   - cont. Home multimodal pain control  - ongoing palliative care eval    ICU Checklist  Analgesia/Sedation - none  O2/Vent - 2LNC  Lines - PIV, R port  Drains - L nephrostomy tube  DVT ppx - lovenox  PPI px- none  Glycemia - <180   PT/OT - yes  Antimicrobial - zosyn/azithromycin  NOK - sister    Code Status: FULL     Patient was seen and discussed with attending physician, Dr. Addison Naegeli.     Tamera Punt, MD  Pulmonary & Critical Care Medicine Fellow

## 2024-02-13 NOTE — ED Notes (Signed)
 Bed: 10  Expected date:   Expected time:   Means of arrival:   Comments:  6

## 2024-02-13 NOTE — ED Notes (Addendum)
 Pt with worsening bradycardia, HR 40s, occasionally dipping to 38-39. BP trending down, levo increased per MAR. Dr. Sena Hitch, first call notified. New orders received. Pt remains arousable. Will continue to monitor.

## 2024-02-13 NOTE — Plan of Care (Addendum)
 Problem: Promotion of Health and Safety  Goal: Promotion of Health and Safety  Description: The patient remains safe, receives appropriate treatment and achieves optimal outcomes (physically, psychosocially, and spiritually) within the limitations of the disease process by discharge.    Information below is the current care plan.  02/13/2024 1832 by Waylan Rocher, RN  Outcome: Progressing  Flowsheets (Taken 02/13/2024 0800)  Patient /Family stated Goal: no more cough  Note: Patient remained on 61mcg/min of levophed throughought shift with good BP response and limited HR ectopy. Patient sat on side of bed, tolerating diet well. Continues to have constant dry cough. IV dilaudid given for pain with mild pain relief endorsed. Wound consult places for sacral injury present on admission.         02/13/2024 1831 by Waylan Rocher, RN  Outcome: Progressing  Note:

## 2024-02-13 NOTE — ED Notes (Signed)
 Bed: 06  Expected date:   Expected time:   Means of arrival:   Comments:  Code

## 2024-02-13 NOTE — ED Notes (Signed)
Franklin MD at bedside

## 2024-02-13 NOTE — Progress Notes (Signed)
 Vancomycin Initiation Note  Vancomycin Indication: Pneumonia (Goal: Trough 10 - 20 mg/L, AUC 400-600 mg-h/L)  73 year old male, Height: 5\' 6"  (167.6 cm), Weight: 67.5 kg (148 lb 13 oz), Kidney Function: SCr: 0.88 mg/dL, at baseline.    Assessment / Plan:   Initial Pharmacokinetic Parameters: Volume of distribution: 46 L, Clearance: 3.5 L/h, Half-life: 9 h  A loading dose of 1500mg  was administered on 2/25 at 0926. Will start 1000mg  IV q12h for an estimated steady state trough of 13 mg/L and AUC 567 mg-h/L.   Monitor kidney function and plan random level on 2/27 or sooner if clinically indicated.    Pharmacist will continue to monitor and make adjustments as needed.    Verdis Frederickson, PharmD, BCPS

## 2024-02-13 NOTE — Interdisciplinary (Signed)
 PT Contact       Row Name 02/13/24 1357       Therapy Contact Note    Therapy not provided at this time as Patient/family/caregiver declined treatment.    Additional Comments Patient declined PT intervention secondary not ready yet. Will schedule PT for another time.

## 2024-02-13 NOTE — ED Provider Notes (Signed)
 ED Provider Note  [place] electronic medical record reviewed for pertinent medical history.     Stephen [name] DOB: May 22, 1951 PMD: Clinic, [facility]     ED Arrival Information       Expected   -    Arrival   02/13/2024 02:07    Acuity   Emergent/ESI Level 2              Means of arrival   Paramedic Unit    Escorted by   -    Service   ED Medicine    Admission type   Emergency              Arrival complaint   hypotension             Chief Complaint   Patient presents with    Blood Pressure     BIBM from SNF. C/o of dry cough for the last 3 days at facility. Pt found to be sat at 80s on RMA-> placed on 2L NC.  BP 80s/palp and Hrs 40s-50s. Given 1mg  of Atropine BP 89/49@0204   BG 141    Cough     x3 days       HPI: Stephen [name] is a 73 year old male who has a past medical history of Chronic back pain, Congenital hydronephrosis, Gout, Headache, Hematuria, HTN (hypertension) (12/10/2021), Kidney disease, Kidney stones, Major depressive disorder, single episode, Polyarthropathy or polyarthritis of multiple sites, Retinal detachment, and Urethral stricture.  presenting for evaluation of hypotension patient states over the last 3 days he has been experiencing progressively worsening cough and states that he was recently treated for pneumonia. Then woke up today morning feeling generalized body weakness. Activated EMS. On arrival EMS found patient to be hypotensive and bradycardic but alert and oriented x4. Per EMS patient was saturating in the 80s he was placed 2 L of nasal cannula SpO2 . Patient is currently on Eliquis. Reports compliance with the medications. Patient reports associated chills and night sweats, denies any syncope or presyncopal episodes, denies any nausea, vomiting, hematemesis, hemoptysis, hematochezia, melena stools, rash, dysphagia, odynophagia,                Hospital medicine note 1 02/08/2024 patient was admitted for cancer associated pain notable history of obstruction. Notable  history of usual carcinoma widespread metastasis. Previous cultures and sensitivities reviewed.         External Data Sources (Select all that apply):      Pertinent Medical History:    PMHx: As above    Past Surgical History:   Procedure Laterality Date    CT INSERTION OF SUPRAPUBIC CATH  09/25/2015    NEPHRECTOMY Right 1955    APPENDECTOMY      COLONOSCOPY      CYSTOSCOPY      CYSTOSCOPY W/ LASER LITHOTRIPSY      OTHER SURGICAL HISTORY      Interstim 01/29/2011    SPINE SURGERY  09/21    Lumbar-sacral fusion    TRANSURETHRAL RESECTION OF PROSTATE         Family History   Adopted: Yes   Family history unknown: Yes       Current Outpatient Medications   Medication Instructions    allopurinol (ZYLOPRIM) 100 mg, Oral, DAILY    aluminum-magnesium-simethicone (MAG-AL PLUS) 200-200-20 MG/5ML suspension Mix viscous lidocaine 2%, Mylanta, and Benadryl liquid into a 1:1:1 solution.  Take 10mL by mouth every 4 hours as needed for pain.  Swish and spit or swallow for pain. If swallowed, wait 10 minutes prior to eating/drinking. May also apply medication to a Q-tip and dab on areas of sores.    Artificial Tear Solution (SOOTHE XP OP)     Balversa 6 mg, Oral, DAILY    busPIRone (BUSPAR) 20 mg, Oral, 2 TIMES DAILY    diphenhydrAMINE (BENADRYL) 12.5 MG/5ML liquid Mix viscous lidocaine 2%, Mylanta, and Benadryl liquid into a 1:1:1 solution.  Take 10mL by mouth every 4 hours as needed for pain.  Swish and spit or swallow for pain. If swallowed, wait 10 minutes prior to eating/drinking. May also apply medication to a Q-tip and dab on areas of sores.    DULoxetine (CYMBALTA) 20 mg, Oral, 2 TIMES DAILY    fluticasone propionate (FLONASE) 50 MCG/ACT nasal spray 2 sprays, Each Naris, DAILY    gabapentin (NEURONTIN) 300 mg, Oral, 3 TIMES DAILY    HYDROmorphone (DILAUDID) 2 MG tablet Take 1 to 2 tablets (2mg  to 4mg ) by mouth every 4 hours as needed for moderate to severe pain    ketotifen (ALAWAY) 0.025 % ophthalmic solution 1 drop, Both  Eyes, 2 TIMES DAILY    lidocaine (XYLOCAINE) 2 % solution Mix viscous lidocaine 2%, Mylanta, and Benadryl liquid into a 1:1:1 solution.  Take 10mL by mouth every 4 hours as needed for pain.  Swish and spit or swallow for pain. If swallowed, wait 10 minutes prior to eating/drinking. May also apply medication to a Q-tip and dab on areas of sores.    Multiple Vitamins-Minerals (MULTIVITAMIN WITH MINERALS) TABS tablet 1 tablet, Oral, DAILY    naloxone (NARCAN) 4 mg/0.1 mL nasal spray For suspected opioid overdose, call 911! Then spray once in one nostril. Repeat after 3 minutes if no or minimal response using a new spray in other nostril.    Nutritional Supplements (NEPRO) LIQD 3 Cans, Oral, DAILY    nystatin (MYCOSTATIN) 500,000 Units, Swish & Swallow, 4 TIMES DAILY    pembrolizumab (KEYTRUDA) 100 MG/4ML SOLN     polyethylene glycol (GLYCOLAX) 17 GM/SCOOP powder Take 17 grams (1 capful) by mouth daily. Mix with 4 to 8 ounces of fluid (water, juice, soda, coffee, or tea) prior to administration.    prochlorperazine (COMPAZINE) 10 mg, Oral, EVERY 6 HOURS PRN    senna (SENOKOT) 17.2 mg, Oral, AT BEDTIME    tizanidine (ZANAFLEX) 2 mg, Oral, EVERY 8 HOURS PRN    traZODone (DESYREL) 50 MG tablet Take one tab nightly by mouth at bedtime as needed for insomnia       Physical Exam  BP (!) 75/57   Pulse 56   Temp 98.4 F (36.9 C)   Resp 17   Ht 5\' 6"  (1.676 m)   Wt 67.5 kg (148 lb 13 oz)   SpO2 95%   BMI 24.02 kg/m   Physical Exam  GENERAL APPEARANCE:  AxOx4, generally well-appearing adult, no acute distress.  HEENT:  NC, AT. MMM. EOMI, clear conjunctiva, oropharynx clear.  NECK:  Supple without lymphadenopathy.  No stiffness or restricted ROM.  HEART:  Normal rate and regular rhythm, normal S1/S1, no m/r/g  LUNGS:   CTAB, moving air well. No crackles or wheezes are heard.  ABDOMEN:  Soft, nontender, nondistended with good bowel sounds heard.  BACK: No CVAT, no obvious deformity.  EXTREMITIES:  Without cyanosis,  clubbing or edema.  NEUROLOGICAL:  Grossly nonfocal. Alert and oriented, moving all 4 extremities. CN grossly intact.  Skin:  Warm and dry without any  rash.     Orders/Medications    Orders Placed This Encounter   Procedures    X-Ray Chest Single View    Venous Blood Gas Heparin Syringe    CBC w/ Diff Lavender    CMP (Comprehensive Metabolic Panel)    Troponin T Gen 5 w/Reflex to CK/CKMB Green Plasma Separator Tube    Troponin T Gen 5 w/Reflex to CK/CKMB Green Plasma Separator Tube    Troponin T Gen 5 w/Reflex to CK/CKMB Green Plasma Separator Tube    Liver Panel, Blood Green Plasma Separator Tube    Lipase, Blood Green Plasma Separator Tube    Bilirubin, Direct, Blood    CPK, Blood Green Plasma Separator Tube    Urinalysis with Culture Reflex, when indicated    GLUCOSE (POCT)    CKMB + Index (No CPK), Blood    CPK, Blood    CKMB + Index (No CPK), Blood    Basic Metabolic Panel, Blood Green Plasma Separator Tube    Magnesium, Blood Green Plasma Separator Tube    Phosphorus, Blood Green Plasma Separator Tube    CBC w/ Diff Lavender    Magnesium, Blood Green Plasma Separator Tube    CPK, Blood    CKMB + Index (No CPK), Blood    ECG 12 Lead    ECG 12 Lead    ECG 12 Lead       Medications   norepinephrine 8 mg in sodium chloride 0.9% 250 mL (LEVOPHED) infusion premix (1 mcg/min IntraVENOUS Rate/Dose Change 02/13/24 0740)   sodium chloride 0.9 % flush 3 mL (3 mL IntraVENOUS Not given 02/13/24 0600)   sodium chloride 0.9 % flush 3 mL (has no administration in time range)   sodium chloride 0.9 % TKO infusion (has no administration in time range)   enoxaparin (LOVENOX) injection 40 mg (has no administration in time range)   allopurinol (ZYLOPRIM) tablet 100 mg (has no administration in time range)   artificial tears (REFRESH) ophthalmic solution 2 drop (has no administration in time range)   busPIRone (BUSPAR) tablet 20 mg (has no administration in time range)   diphenhydrAMINE (BENADRYL) elixir 25 mg (has no administration  in time range)   DULoxetine (CYMBALTA) CR capsule 20 mg (has no administration in time range)   Erdafitinib TABS 6 mg (has no administration in time range)   fluticasone propionate (FLONASE) nasal spray 50 mcg/spray (has no administration in time range)   gabapentin (NEURONTIN) capsule 300 mg (300 mg Oral Given 02/13/24 0741)   HYDROmorphone (DILAUDID) tablet 2 mg (has no administration in time range)   multivitamin with minerals tablet 1 tablet (has no administration in time range)   polyethylene glycol (MIRALAX) packet 17 g (has no administration in time range)   prochlorperazine (COMPAZINE) tablet 10 mg (has no administration in time range)   senna (SENOKOT) tablet 17.2 mg (has no administration in time range)   tiZANidine (ZANAFLEX) tablet 2 mg (has no administration in time range)   traZODone (DESYREL) tablet 50 mg (has no administration in time range)   azithromycin (ZITHROMAX) tablet 500 mg (has no administration in time range)   piperacillin-tazobactam (ZOSYN) 4,500 mg in sodium chloride 0.9 % 100 mL IVPB (4,500 mg IntraVENOUS New Bag 02/13/24 0741)     And   piperacillin-tazobactam (ZOSYN) 4,500 mg in sodium chloride 0.9 % 100 mL IVPB (has no administration in time range)   VANCOMYCIN PER PHARMACIST (has no administration in time range)   vancomycin (VANCOCIN) 1,500 mg in  sodium chloride 0.9 % 500 mL IVPB (has no administration in time range)   lactated ringers 1,965 mL IV bolus ( IntraVENOUS Stopped 02/13/24 0355)   cefTRIAXone (ROCEPHIN) 1,000 mg in sterile water (PF) 10 mL IV (0 mg IntraVENOUS Stopped 02/13/24 0417)   azithromycin (ZITHROMAX) 500 mg in sodium chloride 0.9 % 250 mL IVPB (0 mg IntraVENOUS Stopped 02/13/24 0508)   lactated ringers 500 mL IV bolus ( IntraVENOUS New Bag 02/13/24 0655)           Medical Decision Making/Assessment/Plan    This is a(n) 73 year old male who has a past medical history of Chronic back pain, Congenital hydronephrosis, Gout, Headache, Hematuria, HTN (hypertension)  (12/10/2021), Kidney disease, Kidney stones, Major depressive disorder, single episode, Polyarthropathy or polyarthritis of multiple sites, Retinal detachment, and Urethral stricture. and presents with history as documented above.Vital signs notable for hypotension, heart rate of 56 rest of vital signs within normal limits. No tachypnea or increased work of breathing noted. Notably patient saturating 88 placed on 6 L of oxygen with improvement in SpO2 96%. Physical exam notable for chronically ill-appearing patient, No adventitious lung sounds heard. Notable right-sided for no obvious erythema or induration noted. Abdomen is soft, peripheries warm, notable nephrostomy tube clear urine drainage noted. at this juncture differentials broad to include sepsis, acute electrolyte or metabolic disturbances, at this point patient initiated on IV fluids, septic workup initiated, following initial bolus septic dose of IV fluids patient is hemodynamics improved but became tenuous again this point patient was initiated retractions consulted patient to ICU      ED Course/Updates/Disposition  ED Course as of 02/13/24 0853   Jeronimo Norma Aziz's Documentation   Tue Feb 13, 2024   [id number] Icu coming        Others' Documentation   Tue Feb 13, 2024   0611 74M admitted to ICU for septic shock, hx bladder cancer with R nephrostomy tube, on pressors [RP]      ED Course User Index  [RP] Pile, Reubin Milan, MD         ED Clinical Impression as of 02/13/24 0853   Sepsis, due to unspecified organism, unspecified whether acute organ dysfunction present                     After discussion with the team of Dr. Gran[place] of the Pulmonary / Critical Care [facility]) service, the decision was made to admit the patient to the hospital with a diagnosis of Sepsis (CMS-HCC) [A41.9].     Data Reviewed:    EKG (interpreted by me): No STEMI  Limited Bedside Ultrasound Procedure:   All images archived on [facility] servers.        Risk of  Complications and/or Morbidity:  Risk - Drugs : Prescription drug management               Larita Fife [place], [place], [place]  Resident  02/13/24 [phone]       Lurline Hare, MD  02/20/24 [phone]

## 2024-02-13 NOTE — Progress Notes (Signed)
 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     History  He has been residing at the [name] and developed a productive cough 1-2 weeks ago, reportedly had CXR done showing pneumonia and was given amoxicillin. He does not know if he finished the course of antibiotics. His cough has since improved and is now an intermittent dry cough. Denies overt dyspnea, chest pain, palpitations. Denies nausea/vomiting. No fevers, chills, rigors. He reports being woken up at his SNF today for low blood pressure but denies syncope, presyncopal symptoms. Patient recently admitted to [name] for worsening back pain thought to be cancer related pain - this is still ongoing.     Hospital Course  2/26- Remains on pressors; troponin increased and echo had WMA; cardiology consulted   2/27- Had bradyarrythmia today with NSVT; heparin gtt started; pressors increased    Subjective/Interval Events  Patient feels well today, and would like to drink/eat. He feels that his symptoms are improving. Long goals of care     Pertinent Meds: Reviewed.   allopurinol  100 mg Daily    [START ON 02/14/2024] azithromycin  500 mg Daily    busPIRone  20 mg BID    DULoxetine  20 mg BID    enoxaparin  40 mg Daily    Erdafitinib  6 mg Daily    fluticasone propionate  2 spray Daily    gabapentin  300 mg TID    multivitamin with minerals  1 tablet Daily    piperacillin-tazobactam (ZOSYN) IVPB  4,500 mg Q8H    polyethylene glycol  17 g Daily    senna  17.2 mg HS    sodium chloride  3 mL Q8H        norepinephrine 8 mg in sodium chloride 0.9% 250 mL  0-50 mcg/min 1.9 mL/hr at 02/13/24 0740 1 mcg/min at 02/13/24 0740    sodium chloride              Allergies: Reviewed.  Allergies   Allergen Reactions    Sulfa Drugs Hives, Other and Unspecified       Objective  Vitals: Reviewed.  I/O Last 24: Reviewed.  Pressors: Reviewed.  Levophed: 2 mcg/min    Vent/ECMO: Reviewed.      Physical Exam  GEN: NAD, comfortable in bed  HEENT: NC/AT, MMM, OP without lesions  CARDS: Clear S1/S2, RR, no MRCG  RESP: CTAB, no wheezes/rales  ABD: Soft, NT/ND, NABS  MSK/EXT: WWP, no LE edema  NEURO: Non-focal  PSYCH: Normal affect    Pertinent Labs: Reviewed.  CBC  Lab Results   Component Value Date    WBC 5.9 02/17/2024    RBC 2.61 (L) 02/17/2024    HGB 7.8 (L) 02/17/2024    HCT 24.8 (L) 02/17/2024    MCV 95.0 02/17/2024    MCH 29.9 02/17/2024    MCHC 31.5 (L) 02/17/2024    RDW 15.0 (H) 02/17/2024    MPV 9.1 (L) 02/17/2024    PLT 253 02/17/2024    IANC 3.3 07/27/2023    SEG 67.7 02/17/2024    IMMGRANPC 0.3 02/17/2024    LYMPHS 19.7 02/17/2024    MONOS 8.4  02/17/2024    EOS 3.4 02/17/2024    BASOS 0.5 02/17/2024    ABSNEUTRO 4.0 02/17/2024    ABSLYMPHS 1.2 02/17/2024    ABSMONOS 0.5 02/17/2024    ABSEOS 0.2 02/17/2024    DIFFTYPE Automated 02/17/2024       CMP  Lab Results   Component Value Date    GLU 175 (H) 02/17/2024    BUN 30 (H) 02/17/2024    CREAT 1.23 (H) 02/17/2024    EGFRCKDEPI >60 02/17/2024    NA 149 (H) 02/17/2024    K 4.1 02/17/2024    CL 114 (H) 02/17/2024    BICARB 28 02/17/2024    ANION 7 02/17/2024    [place] 10.0 02/17/2024    TP 5.7 (L) 02/13/2024    ALB 2.6 (L) 02/13/2024    TBILI 0.71 02/13/2024    AST 34 02/13/2024    ALT 29 02/13/2024    ALK 115 02/13/2024       TSH  Lab Results   Component Value Date    TSH 0.40 11/28/2023       FT4  Lab Results   Component Value Date    FREET4 1.12 09/21/2023         Micro Data: Reviewed.    Prior Pertinent Micro Data: Reviewed.  Pertinent Imaging: Reviewed.    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 Course c/b NSTEMI with evidence of WMA on echo--?stress cardiomyopathy.      # Septic Shock   # Severe, Recurrent Aspiration Pneumonia- Aspiration   -Continue vanco and zosyn  -Will continue Levophed to keep MAP >65 while in hospital, with intention to turn off on arrival at hospice center     # NSTEMI with HFrEF  -Cont Aspirin  -Discontinue Heparin     # Metastatic urothelial cancer   Followed by Dr. Roseanne [name]. Patient has no treatment further treatment options.  -Dilaudid PRN for cancer related pain     #Goals of Care  Patient expresses understanding that he has untreatable metastatic urothelial cancer, and is severely week, with worsening, recurrent aspiration. He understands that attempts at eating/drinking are likely to worsen or cause recurrence of pneumonia. For this reason, he wishes to transition to DNR/DNI with no escalation of care, with intention to transition to comfort care once he has picked a hospice facility  -Keep on Vasopressors until able to get to     ICU Checklist  Analgesia/Sedation - Dilaudid PRN  Breathing - NC  Catheters - PICC, NGT  Delirium - None  Early mobility/DVT ppx - Turning off heparin today  Family - Updated sister at bedside  Gut - Pepcid  Glycemia - None  Skin - Turns  Deescalation of antibiotics - Will stop all abx once discharging to hospice    Code Status: Orders Placed This Encounter      Full Code - Call Code      Patient was seen and discussed with attending physician, [name].    Stephen Dawley, DO  Pulmonary & Critical Care Fellow  [name]

## 2024-02-13 NOTE — ED Notes (Signed)
 Repeat EKG done, given to Dr. Susann Givens at this time

## 2024-02-13 NOTE — Consults (Signed)
 ONCOLOGY CONSULT NOTE    Reason for Consult: PNA with septic shock in setting of advanced urothelial carcinoma  Physician Requesting Consult: Dr. 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    Today he is still in the ED, levophed weaned down to 1 mcg, oxygen at 3L.  He complains of R leg pain in hip/knee.    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 Multiple osseous lesions involving the bilateral iliac crest and right acetabulum concerning for osseous metastatic disease.   11/26/22  PET/CT: diffuse osseous FDG activity with discrete osseous lesions concerning for metastatic disease.  12/22/22  L iliac crest biopsy: metastatic urothelial carcinoma  01/09/23  Seen by ortho, recommend palliative XRT  01/17/23  Seen by rad onc, simulation planned for 2/02  01/20/23  C1D1 EV 1.25 mg/kg D1,8 of 21 day cycle + pembrolizumab 200 mg IV D1 of 21 day  cycle  02/11-02/19  Admitted to [facility] with AMS and resp failure 2/2 opiates.  Also treated for septic shock, AKI (Cr 2.5) and acute liver injury (AST/ALT 2800/2100).   S/p exchange L PCN.   02/07/23  start XRT to R Shoulder  02/10/23  C2D1 EV (dose reduced to 1.0 mg/kg) + pembro 200 mg IV  03/03/23  C3D1 EV/pembro, missed D8  03/06/23  Cysto: NED.  Cytology NED.   03/14/23  Seen at [facility] ED for dislodged tube, replaced  04/02-05/24  Admitted with weakness, poor PO intake.   04/06/23  C4D1 EV/pembro  04/13/23  C4D8 EV  04/26/23  PET/CT: osseous lesions much improved, many new sclerotic lesions c/w treatment change.  Nonspecific mediastinal and pelvic LN  05/09-11/24  Admitted for treatment of AKI and rhabdomyolysis  05/11/23  C5D1 EV/P DR to 0.75  06/01/23  C6D1 EV/Pembro 0.75  06/23/23  MRI C-Spine: disease in spine, but no fracture or epidural involvement.  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 2. Although there has been interval improvement/resolution of previous multiple lesions in the sternum, spine and  pelvis, there are multiple new lesions in L4, right posterior L2, and left lateral 4th and 7th ribs.  11/23/23  Saw me, significant side effects from erda (HFS, diarrhea), held erda  12/10-16/24  Admitted for UTI, constipation and   12/02/23  MRI spine: diffuse osseous disease, epidural extension at T4, minimal enhancement at L5-S1 of cauda equina nerve roots, no evidence of leptomeningeal disease  01/14-01/22/25  Admitted with worsening pain in feet, back. MRI with diffuse disease.  S/p XRT to C7-T4 and L1-L4 in single  fraction on 1/17.  01/14/23  Seen in ED with hope to be placed.  Dc'd to [facility].    Review of Systems:  10/14 systems reviewed and otherwise negative except for aforementioned symptoms.     PMHx/Surg Hx/Fam Hx/Allergies Reviewed in Epic    Current Inpatient Meds Reviewed    Physical Exam:   02/13/24  0716 02/13/24  0730 02/13/24  0740 02/13/24  0800   BP: (!) 88/64 123/70 159/57 (!) 75/57   Pulse: 56 59 68 56   Temp:       Resp: 23 17 17 17    SpO2:         Chronically ill, temporal wasting, coughing  Lungs clear  RRR no mrg  Abd soft  No edema  No sores on hands/feet.  No rash  Cognition and speech pattern at baseline      LABS  Lab Results   Component Value Date    NA 135 (L) 02/13/2024    K 4.1 02/13/2024    CL 98 02/13/2024    BICARB 29 02/13/2024    BUN 30 (H) 02/13/2024    CREAT 0.88 02/13/2024    GLU 134 (H) 02/13/2024    [place] 9.8 02/13/2024     Lab Results   Component Value Date    WBC 6.5 02/13/2024    HGB 9.6 (L) 02/13/2024    HCT 28.8 (L) 02/13/2024    PLT 230 02/13/2024    SEG 79.9 02/13/2024    LYMPHS 12.5 02/13/2024    MONOS 6.6 02/13/2024    EOS 0.3 02/13/2024     Lab Results   Component Value Date    AST 34 02/13/2024    ALT 29 02/13/2024    ALK 115 02/13/2024    TBILI 0.71 02/13/2024    DBILI 0.2 02/13/2024    TP 5.7 (L) 02/13/2024    ALB 2.6 (L) 02/13/2024     No results found for: "INR", "PTT"    Influenza/covid testing negative    Assessment and Recommendations:  73 yo M with advanced urothelial carcinoma presents with PNA/septic shock.    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  Given some worsening symptoms (pain), I suspect he has progression of disease.  We had tried to get a PET/CT as an outpatient, but he missed this last week.  It would be helpful to repeat staging scans with CT CAP to get a better sense of what his disease is doing; however, he has osseous predominant disease  and CT scans may not be as good as PET/CT to pick this up.  If he has progression of disease, treatment options would include cytotoxic chemotherapy which Stephen [name] is certainly not a candidate for now, and may not be in the future given his significantly declined functional status.  If POD on scans, his prognosis is very likely less than 6 months,  and likely much less.      At this time, I recommend continued supportive care for PNA.  When stable, it would be helpful to obtain a CT CAP for restaging.  Appreciate care from the ED an ICU teams.    Please feel free to reach out to me personally regarding this patient who is well known to me.    I spent greater than 80 minutes today reviewing patient's file, including notes, labs and imaging, interacting face to face with the patient, counseling the patient, placing orders to be done prior to next visit, coordinating care and documenting this visit in the patient's chart.    Asa Saunas. Roseanne Reno, M.D.  Assistant Professor of Medicine   [employer], [facility]

## 2024-02-13 NOTE — ED Notes (Signed)
 ICU Mds at bedside at this time

## 2024-02-14 ENCOUNTER — Inpatient Hospital Stay (HOSPITAL_BASED_OUTPATIENT_CLINIC_OR_DEPARTMENT_OTHER): Payer: Medicare Other

## 2024-02-14 ENCOUNTER — Other Ambulatory Visit (HOSPITAL_BASED_OUTPATIENT_CLINIC_OR_DEPARTMENT_OTHER): Payer: Self-pay

## 2024-02-14 DIAGNOSIS — J90 Pleural effusion, not elsewhere classified: Secondary | ICD-10-CM

## 2024-02-14 DIAGNOSIS — R579 Shock, unspecified: Secondary | ICD-10-CM

## 2024-02-14 DIAGNOSIS — R001 Bradycardia, unspecified: Secondary | ICD-10-CM

## 2024-02-14 DIAGNOSIS — R140 Abdominal distension (gaseous): Secondary | ICD-10-CM

## 2024-02-14 DIAGNOSIS — I214 Non-ST elevation (NSTEMI) myocardial infarction: Secondary | ICD-10-CM

## 2024-02-14 DIAGNOSIS — I5020 Unspecified systolic (congestive) heart failure: Secondary | ICD-10-CM

## 2024-02-14 DIAGNOSIS — R0602 Shortness of breath: Secondary | ICD-10-CM

## 2024-02-14 DIAGNOSIS — Z4659 Encounter for fitting and adjustment of other gastrointestinal appliance and device: Secondary | ICD-10-CM

## 2024-02-14 LAB — BASIC METABOLIC PANEL, BLOOD
Anion Gap: 10 mmol/L (ref 7–15)
BUN: 24 mg/dL — ABNORMAL HIGH (ref 8–23)
Bicarbonate: 29 mmol/L (ref 22–29)
Calcium: 9.9 mg/dL (ref 8.5–10.6)
Chloride: 103 mmol/L (ref 98–107)
Creatinine: 0.99 mg/dL (ref 0.67–1.17)
Glucose: 139 mg/dL — ABNORMAL HIGH (ref 70–99)
Potassium: 4 mmol/L (ref 3.5–5.1)
Sodium: 142 mmol/L (ref 136–145)
eGFR Based on CKD-EPI 2021 Equation: >60 mL/min/{1.73_m2}

## 2024-02-14 LAB — CBC WITH DIFF, BLOOD
ANC-Automated: 7.6 10*3/uL — ABNORMAL HIGH (ref 1.6–7.0)
Abs Basophils: 0 10*3/uL (ref <0.2)
Abs Eosinophils: 0.1 10*3/uL (ref 0.0–0.5)
Abs Lymphs: 1.4 10*3/uL (ref 0.8–3.1)
Abs Monos: 0.8 10*3/uL (ref 0.2–0.8)
Basophils: 0.4 %
Eosinophils: 0.7 %
Hct: 25.3 % — ABNORMAL LOW (ref 40.0–50.0)
Hgb: 8.2 g/dL — ABNORMAL LOW (ref 13.7–17.5)
Imm Gran %: 0.6 % (ref <1)
Imm Gran Abs: 0.1 10*3/uL — ABNORMAL HIGH (ref <0.1)
Lymphocytes: 13.9 %
MCH: 29.9 pg (ref 26.0–32.0)
MCHC: 32.4 g/dL (ref 32.0–36.0)
MCV: 92.3 um3 (ref 79.0–95.0)
MPV: 9.2 fL — ABNORMAL LOW (ref 9.4–12.4)
Monocytes: 8 %
Plt Count: 309 10*3/uL (ref 140–370)
RBC: 2.74 10*6/uL — ABNORMAL LOW (ref 4.60–6.10)
RDW: 14.7 % — ABNORMAL HIGH (ref 12.0–14.0)
Segs: 76.4 %
WBC: 9.9 10*3/uL (ref 4.0–10.0)

## 2024-02-14 LAB — 2D ECHO WITH IMAGE ENHANCEMENT AGENT IF NECESSARY
Ao Asc Diameter (cm): 3.7 cm
Ao Asc Index (cm/m2): 2.11 cm/m2
Ao Root Diameter (cm): 4.2 cm
Ao Root Index (cm/m2): 2.4 cm/m2
IVC Diameter: 2.27 cm
IVS Diameter (cm): 0.88 cm
LA Diameter: 3.9 cm
LA Volume Index: 46 mL/m2
LV Diastolic Diameter: 5.47 cm
LV Diastolic Volume Index: 86 mL/m2
LV Diastolic Volume: 150.1 mL
LV Ejection Fraction: 52 %
LV Systolic Diameter: 3.34 cm
LV Systolic Volume Index: 41 mL/m2
LV Systolic Volume: 72.3 mL
MV E/A Ratio: 1.22 unitless
Mitral E:E' Ratio: 8.56 unitless
PA Pressure: 47 mm[Hg]
TR Velocity: 3.14 m/s

## 2024-02-14 LAB — CKMB+INDEX (NO CPK), BLOOD
CK-MB Index: 10.8 %
CK-MB Index: 15.8 %
CK-MB Index: 17.2 %
CK-MB: 4 ng/mL (ref 0–5)
CK-MB: 6 ng/mL — ABNORMAL HIGH (ref 0–5)
CK-MB: 5 ng/mL (ref 0–5)

## 2024-02-14 LAB — TROPONIN T GEN 5
Troponin T Gen 5: 405 ng/L (ref <22)
Troponin T Gen 5: 311 ng/L (ref <22)
Troponin T Gen 5: 277 ng/L (ref <22)

## 2024-02-14 LAB — CPK-CREATINE PHOSPHOKINASE, BLOOD
CPK: 37 U/L (ref 0–175)
CPK: 38 U/L (ref 0–175)
CPK: 29 U/L (ref 0–175)

## 2024-02-14 LAB — MRSA SURVEILLANCE CULTURE

## 2024-02-14 LAB — PROTHROMBIN TIME, BLOOD
INR: 1.4
PT,Patient: 14.5 s — ABNORMAL HIGH (ref 9.7–12.5)

## 2024-02-14 LAB — MAGNESIUM, BLOOD: Magnesium: 1.8 mg/dL (ref 1.6–2.4)

## 2024-02-14 LAB — PHOSPHORUS, BLOOD: Phosphorous: 5 mg/dL — ABNORMAL HIGH (ref 2.7–4.5)

## 2024-02-14 LAB — APTT, BLOOD: PTT: 29 s (ref 27–36)

## 2024-02-14 MED ORDER — PROCHLORPERAZINE MALEATE 10 MG OR TABS
10.0000 mg | ORAL_TABLET | Freq: Four times a day (QID) | ORAL | Status: AC | PRN
Start: 2024-02-14 — End: ?
  Administered 2024-02-15 (×2): 10 mg via NASOGASTRIC
  Filled 2024-02-14 (×2): qty 1

## 2024-02-14 MED ORDER — VENLAFAXINE HCL 50 MG OR TABS
50.0000 mg | ORAL_TABLET | Freq: Two times a day (BID) | ORAL | Status: AC
Start: 2024-02-14 — End: ?
  Administered 2024-02-15 – 2024-02-22 (×15): 50 mg via NASOGASTRIC
  Filled 2024-02-14 (×16): qty 1

## 2024-02-14 MED ORDER — SODIUM CHLORIDE 0.9 % IJ SOLN (CUSTOM)
10.0000 mL | INTRAMUSCULAR | Status: DC | PRN
Start: 2024-02-14 — End: 2024-02-22
  Administered 2024-02-14: 10 mL via INTRAVENOUS

## 2024-02-14 MED ORDER — HEPARIN SODIUM LOCK FLUSH 100 UNIT/ML IJ SOLN CUSTOM
100.0000 [IU] | Freq: Once | INTRAVENOUS | Status: DC | PRN
Start: 2024-02-14 — End: 2024-02-22

## 2024-02-14 MED ORDER — GABAPENTIN 250 MG/5ML OR SOLN
300.0000 mg | Freq: Three times a day (TID) | ORAL | Status: AC
Start: 2024-02-14 — End: ?
  Administered 2024-02-14 – 2024-02-22 (×24): 300 mg via NASOGASTRIC
  Filled 2024-02-14 (×24): qty 6

## 2024-02-14 MED ORDER — LIDOCAINE HCL 1 % IJ SOLN
5.0000 mL | Freq: Once | INTRAMUSCULAR | Status: DC | PRN
Start: 2024-02-14 — End: 2024-02-14

## 2024-02-14 MED ORDER — SODIUM CHLORIDE 0.9 % IJ SOLN (CUSTOM)
10.0000 mL | Freq: Three times a day (TID) | INTRAMUSCULAR | Status: DC
Start: 2024-02-14 — End: 2024-02-14

## 2024-02-14 MED ORDER — FAMOTIDINE 20 MG OR TABS
20.0000 mg | ORAL_TABLET | Freq: Every day | ORAL | Status: AC
Start: 2024-02-14 — End: ?
  Administered 2024-02-14 – 2024-02-22 (×9): 20 mg via NASOGASTRIC
  Filled 2024-02-14 (×9): qty 1

## 2024-02-14 MED ORDER — SODIUM CHLORIDE 0.9 % IJ SOLN (CUSTOM)
10.0000 mL | Freq: Three times a day (TID) | INTRAMUSCULAR | Status: DC
Start: 2024-02-14 — End: 2024-02-22
  Administered 2024-02-15 – 2024-02-22 (×17): 10 mL

## 2024-02-14 MED ORDER — HEPARIN SODIUM LOCK FLUSH 100 UNIT/ML IJ SOLN CUSTOM
100.0000 [IU] | Freq: Once | INTRAVENOUS | Status: DC | PRN
Start: 2024-02-14 — End: 2024-02-14

## 2024-02-14 MED ORDER — DIPHENHYDRAMINE HCL 12.5 MG/5ML OR ELIX
25.0000 mg | ORAL_SOLUTION | ORAL | Status: AC | PRN
Start: 2024-02-14 — End: ?

## 2024-02-14 MED ORDER — HEPARIN (PORCINE) IN NACL 25000 UNITS/250ML 0.45% NACL IV SOLN (~~LOC~~ CUSTOM)
INTRAVENOUS | Status: DC
Start: 2024-02-14 — End: 2024-02-14
  Administered 2024-02-14: 800 [IU]/h via INTRAVENOUS
  Filled 2024-02-14: qty 250

## 2024-02-14 MED ORDER — SODIUM CHLORIDE 0.9 % IJ SOLN (CUSTOM)
10.0000 mL | INTRAMUSCULAR | Status: DC | PRN
Start: 2024-02-14 — End: 2024-02-14

## 2024-02-14 MED ORDER — HYDROMORPHONE HCL 1 MG/ML IJ SOLN
1.0000 mg | INTRAMUSCULAR | Status: DC | PRN
Start: 2024-02-14 — End: 2024-02-14

## 2024-02-14 MED ORDER — TRAZODONE HCL 50 MG OR TABS
50.0000 mg | ORAL_TABLET | Freq: Every evening | ORAL | Status: AC | PRN
Start: 2024-02-14 — End: ?
  Administered 2024-02-14 – 2024-02-20 (×5): 50 mg via NASOGASTRIC
  Filled 2024-02-14 (×5): qty 1

## 2024-02-14 MED ORDER — ALLOPURINOL 100 MG OR TABS
100.0000 mg | ORAL_TABLET | Freq: Every day | ORAL | Status: AC
Start: 2024-02-15 — End: ?
  Administered 2024-02-15 – 2024-02-22 (×8): 100 mg via NASOGASTRIC
  Filled 2024-02-14 (×8): qty 1

## 2024-02-14 MED ORDER — TIZANIDINE HCL 4 MG OR TABS
2.0000 mg | ORAL_TABLET | Freq: Three times a day (TID) | ORAL | Status: AC | PRN
Start: 2024-02-14 — End: ?
  Administered 2024-02-15 – 2024-02-21 (×17): 2 mg via NASOGASTRIC
  Filled 2024-02-14 (×17): qty 1

## 2024-02-14 MED ORDER — ENOXAPARIN SODIUM 40 MG/0.4ML IJ SOSY
40.0000 mg | PREFILLED_SYRINGE | Freq: Every day | INTRAMUSCULAR | Status: DC
Start: 2024-02-15 — End: 2024-02-15
  Administered 2024-02-15: 40 mg via SUBCUTANEOUS
  Filled 2024-02-14: qty 1

## 2024-02-14 MED ORDER — LIDOCAINE HCL 1 % IJ SOLN
5.0000 mL | Freq: Once | INTRAMUSCULAR | Status: AC | PRN
Start: 2024-02-14 — End: 2024-02-15

## 2024-02-14 MED ORDER — SENNA 8.6 MG OR TABS
17.2000 mg | ORAL_TABLET | Freq: Every evening | ORAL | Status: AC
Start: 2024-02-15 — End: ?
  Administered 2024-02-15 – 2024-02-20 (×3): 17.2 mg via NASOGASTRIC
  Filled 2024-02-14 (×4): qty 2

## 2024-02-14 MED ORDER — THERA M PLUS OR TABS
1.0000 | ORAL_TABLET | Freq: Every day | ORAL | Status: DC
Start: 2024-02-15 — End: 2024-02-19
  Administered 2024-02-15 – 2024-02-18 (×4): 1 via NASOGASTRIC
  Filled 2024-02-14 (×4): qty 1

## 2024-02-14 MED ORDER — AZITHROMYCIN 250 MG OR TABS
500.0000 mg | ORAL_TABLET | Freq: Every day | ORAL | Status: AC
Start: 2024-02-14 — End: 2024-02-14
  Administered 2024-02-14: 500 mg via NASOGASTRIC
  Filled 2024-02-14: qty 2

## 2024-02-14 MED ORDER — HEPARIN IV BOLUS FROM BAG
60.0000 [IU]/kg | Freq: Once | INTRAMUSCULAR | Status: DC
Start: 2024-02-14 — End: 2024-02-14

## 2024-02-14 MED ORDER — ASPIRIN 81 MG OR CHEW
81.0000 mg | CHEWABLE_TABLET | Freq: Every day | ORAL | Status: AC
Start: 2024-02-15 — End: ?
  Administered 2024-02-15 – 2024-02-22 (×8): 81 mg via NASOGASTRIC
  Filled 2024-02-14 (×8): qty 1

## 2024-02-14 MED ORDER — BUSPIRONE HCL 10 MG OR TABS
20.0000 mg | ORAL_TABLET | Freq: Two times a day (BID) | ORAL | Status: AC
Start: 2024-02-15 — End: ?
  Administered 2024-02-15 – 2024-02-22 (×15): 20 mg via NASOGASTRIC
  Filled 2024-02-14 (×15): qty 2

## 2024-02-14 MED ORDER — ASPIRIN 81 MG OR CHEW
81.0000 mg | CHEWABLE_TABLET | Freq: Every day | ORAL | Status: DC
Start: 2024-02-15 — End: 2024-02-14

## 2024-02-14 MED ORDER — MAGNESIUM SULFATE IN D5W 1-5 GM/100ML-% IV SOLN
1.0000 g | Freq: Once | INTRAVENOUS | Status: AC
Start: 2024-02-14 — End: 2024-02-14
  Administered 2024-02-14: 1 g via INTRAVENOUS
  Filled 2024-02-14: qty 100

## 2024-02-14 MED ORDER — HYDROMORPHONE HCL 1 MG/ML IJ SOLN
1.0000 mg | INTRAMUSCULAR | Status: AC | PRN
Start: 2024-02-14 — End: ?
  Administered 2024-02-14 – 2024-02-22 (×37): 1 mg via INTRAVENOUS
  Filled 2024-02-14 (×37): qty 1

## 2024-02-14 MED ORDER — HEPARIN (PORCINE) IN NACL 25000 UNITS/250ML 0.45% NACL IV SOLN (~~LOC~~ CUSTOM)
INTRAVENOUS | Status: DC
Start: 2024-02-14 — End: 2024-02-14

## 2024-02-14 MED ORDER — POLYETHYLENE GLYCOL 3350 OR PACK
17.0000 g | PACK | Freq: Every day | ORAL | Status: AC
Start: 2024-02-15 — End: ?
  Administered 2024-02-15: 17 g via NASOGASTRIC
  Filled 2024-02-14 (×2): qty 1

## 2024-02-14 MED ORDER — HEPARIN IV BOLUS FROM BAG
60.0000 [IU]/kg | Freq: Once | INTRAMUSCULAR | Status: AC
Start: 2024-02-14 — End: 2024-02-14
  Administered 2024-02-14: 4000 [IU] via INTRAVENOUS

## 2024-02-14 NOTE — Progress Notes (Signed)
 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 He has been residing at the [facility] and developed a productive cough 1-2 weeks ago, reportedly had CXR done showing pneumonia and was given amoxicillin. He does not know if he finished the course of antibiotics. His cough has since improved and is now an intermittent dry cough. Denies overt dyspnea, chest pain, palpitations. Denies nausea/vomiting. No fevers, chills, rigors. He reports being woken up at his SNF today for low blood pressure but denies syncope, presyncopal symptoms. Patient recently admitted to [facility] 1/14-1/22 for worsening back pain thought to be cancer related pain - this is still ongoing.    Events:  2/26--remains on pressors; troponin increased and echo had WMA; cardiology consulted     Pertinent Meds:    allopurinol  100 mg Daily    azithromycin  500 mg Daily    busPIRone  20 mg BID    DULoxetine  20 mg BID    enoxaparin  40 mg Daily    Erdafitinib  6 mg Daily    fluticasone propionate  2 spray Daily    gabapentin  300 mg TID    multivitamin with minerals  1 tablet Daily    piperacillin-tazobactam (ZOSYN) IVPB  4,500 mg Q8H    polyethylene glycol  17 g Daily    senna  17.2 mg HS    sodium chloride  10 mL Q8H    sodium chloride  3 mL Q8H    vancomycin (VANCOCIN) IVPB  1,000 mg Q12H        norepinephrine 8 mg in sodium chloride 0.9% 250 mL  0-50 mcg/min 9.4 mL/hr at 02/14/24 0700 5 mcg/min at 02/14/24 0700    sodium chloride           Allergies:   Allergies   Allergen Reactions    Sulfa Drugs Hives, Other and Unspecified     Exam:  Temperature:  [96 F (35.6 C)-99.2 F (37.3 C)] 97.7 F (36.5 C) (02/26  1200)  Blood pressure (BP): (69-132)/(42-70) 118/69 (02/26 1600)  Heart Rate:  [47-72] 57 (02/26 1600)  Respirations:  [10-24] 18 (02/26 1600)  Pain Score: 9 (02/26 1637)  O2 Device: Nasal cannula (02/26 1600)  O2 Flow Rate (L/min):  [2 l/min-15 l/min] 5 l/min (02/26 1600)  SpO2:  [84 %-100 %] 98 % (02/26 1600)    GEN: elderly appearing, NAD  HEENT: EOMI, dry mucous membranes   CARDS: RRR  RESP: decreased BS bases  ABD: Soft, NT/ND, NABS  MSK/EXT: WWP, trace pitting edema L>R     Pertinent Labs: Reviewed.  Na 142 (02/26) CL 103 (02/26) BUN 24* (02/26) GLU   139* (02/26)   K 4.0 (02/26) CO2 29 (02/26) Cr 0.99 (02/26)      WBC 9.9 (02/26) HGB 8.2* (02/26) PLT 309 (02/26)    HCT 25.3* (02/26)      Micro Data:   Lab Results   Component Value Date    BLOODCULT (A) 01/12/2022     Enterococcus faecalis  Anaerobic Bottle:  For susceptibility results refer to culture #[id number]  Collected date:01/10/22  Collected time:1558  .  Identification performed by [employer]( [facility]). This test  was developed and its performance characteristics determined by [facility]. It has not been cleared or  approved by the U.S. Food and Drug Administration.  The FDA has  determined that such clearance or approval is not necessary.      URINECULTURE (A) 11/28/2023     Enterococcus faecalis  >100,000 CFU/mL  Identification performed by [employer]( [facility]). This test  was developed and its performance characteristics determined by [facility]. It has not been cleared or  approved by the U.S. Food and Drug Administration.  The FDA has  determined that such clearance or approval is not necessary.      URINECULTURE (A) 11/28/2023     Gram-negative bacillus  50,000 - 100,000 CFU/mL  (Mixta intestinalis)  .  Identification performed by [employer]( [facility]). This test  was developed and its performance characteristics determined by [facility]. It has not been cleared or  approved by the U.S. Food and Drug Administration.  The FDA has  determined that such clearance or approval is not necessary.  .  Susceptibility testing performed at:  [facility]  [phone], [email or web address]   [address]   [place], [place] [address]  Evans Lance, MD, PhD, Chief Medical Officer         Results for orders placed or performed in visit on 09/21/23   HIV 1/2 Antibody & P24 Antigen Assay   Result Value Ref Range    HIV 1/2 Antibody & P24 Antigen Assay Non Reactive      *Note: Due to a large number of results and/or encounters for the requested time period, some results have not been displayed. A complete set of results can be found in Results Review.       Pertinent Imaging: Reviewed.  2/25 CXR     Unchanged positioning of the right IJ MediPort with the tip projecting over the mid SVC.     The cardiac silhouette is within normal limits     Interval development of right perihilar ill-defined opacities, given the provided history, concerning for pneumonia. No sizable pleural effusion or pneumothorax.     The visualized osseous structures demonstrate no acute abnormality. Degenerative changes of the spine.    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 Course c/b NSTEMI with evidence of WMA on echo--?stress cardiomyopathy.     (1) shock -- septic +/- cardiac  (2) pneumonia--likely aspiration     --f/u cultures, cont vanco and zosyn  --cont pressors to keep MAP > 65  --hold on more fluids given hypoxemia and HFrEF    (3) NSTEMI with HFrEF    --start Aspirin  --hold heparin gtt  --repeat echo once clinically improved and start aldactone and losartan as per cardiology    (4) metastatic urothelial cancer     --followed  by Dr. Roseanne Reno    ICU:  --lovenox  --pepcid  --start TFs given high aspiration risk     This patient is critically ill requiring critical care services due to vital organ system dysfunction that includes the following organ system(s); cardiovascular.    These problems include or stem from  shock .  My critical care of these organ system problems is required to assess, manipulate, and support vital organ function to prevent further life-threatening deterioration of the patient's condition including titration of pressors.     Medical complexity of this patient's current illness is high.    Patient's risk of deterioration and/or death is high.    Total time I spent providing critical care to this patient today exclusive of separately reportable procedures: 45 minutes    [place], MD

## 2024-02-14 NOTE — Interdisciplinary (Addendum)
 Speech-Language Pathology    Clinical Bedside Swallow Evaluation   Assessment   Pt present with mild oral and suspected mild pharyngeal stage dysphagia characterized by overt s/sx of aspiration with thin liquids to include delayed cough and wet vocal quality with all thin liquid trials. Baseline diet is puree and thin liquids at his SNF based on most recent FEES. Pt endorsed coughing with thin liquids during meals presently. Most recent VFSS completed in May 2024 revealed "mild oropharyngeal dysphagia nd mild dysphonia associated with cancer diagnosis and associated weight loss/deconditioning. FEES identified reduced BOT retraction with associated retention in valleculae." Diet recommended was puree and thin liquids (IDDSI L4/LO).  SLP consulted due to coughing noted by RN during oral care.        Recommendations   Diet: pureed IDDSI-4 foods and mildly thick (nectar) IDDSI-2 drinks  Single sips  Sit fully upright  Slow rate  Small sips & bites  RN Misc Order: Single ice chips after oral care  If any clinical changes noted in respiratory status, level of alertness or mentation are noted, consider hold PO intake or modify to NPO and re-consult SLP for reassessment promptly.   2      Focus on next session:  assessment of advanced liquid trials and consider need for instrumental   assessment.   3.     Medication administration: crushed in pureed IDDSI-4 food  4.     Oral hygiene: brush gums and tongue TID  5.     Discharge:   Continue SLP services for swallow therapy while inpatient or at next level of care if discharged today. Consider OP VFSS      Please contact SLP on-call pager with questions or concerns:  [facility] [phone]  [place] [phone]  [place] [phone]        Subjective and Objective - Additional Information   Stephen [name] is a 73 year old male with a history of metastatic urothelial carcinoma (bone) s/p L nephrostomy and ureteral stent placement s/p chemoradiation, most recently  on EV/pembrolizumab (last given 03/03/23), CKD, gout, BPH s/p TURP, insomnia, and recent admission to [facility] (3/25-3/26) for a dislodged nephrostomy tube who presented w/ a constellation of symptoms including nausea, poor PO intake, weight loss, constipation, and generalized weakness and has been admitted for failure to thrive.     Pt was seen upright in bed. Patient alert and agreeable. RN and Patient endorsed difficulty swallowing.   Patient cleared by RN; no family/caregivers present for session.   Patient Stated Goal: drink some water     Respiratory Status: Nasal Canula 5L/min  Contact Time: 12:00 PM  Total session duration (min): 1245  Preferred Language: English         Pain Score: 0      Treatment Diagnosis: Oropharyngeal dysphagia             DYSPHAGIA  Goal 1: Patient will utilize swallow precautions/modifications for reduced risk of aspiration on least restrictive diet level  Status: New                           Treatment Plan     Treatment plan: 57846  Tx of swallowing dysfunction and/or oral function for feeding    Frequency: 3 times per week    Duration of treatment (in weeks): 2 weeks         SLP provided education verbally and in written format regarding aspiration precautions,  compensatory strategies, and POC, Stephen Hua verbalized understanding. Interdisciplinary communication included MD/DO, RN, and aspiration precautions posted at bedside.          DC Checklist     Does pt. require additional therapy session prior to DC?: No

## 2024-02-14 NOTE — Goals of Care (Signed)
 We discussed his current clinical condition in the setting of his metastatic urothelial cancer and went over his wishes in case he had worsening respiratory status and/or a cardiac arrest.      He clearly stated he would not want to be intubated or undergo CPR/ALCS.      Therefore code status is DNR/DNI/full care    Stephen [place], MD

## 2024-02-14 NOTE — Plan of Care (Signed)
 Problem: Promotion of Health and Safety  Goal: Promotion of Health and Safety  Description: The patient remains safe, receives appropriate treatment and achieves optimal outcomes (physically, psychosocially, and spiritually) within the limitations of the disease process by discharge.    Information below is the current care plan.  Outcome: Not Progressing  Flowsheets  Taken 02/14/2024 1610  Guidelines: Inpatient Nursing Guidelines  Individualized Interventions/Recommendations #1: Monitor oxygenation  Individualized Interventions/Recommendations #2 (if applicable): Maintain hemodynamics, MAP > 65  Individualized Interventions/Recommendations #3 (if applicable): Offload bony prominences, maintain skin integrity  Outcome Evaluation (rationale for progressing/not progressing) every shift: Pt admitted from ED overnight. AOx4. On pressors for BP support. On 4 L NC, desats with coughing fits. Trending trops, several 12 lead EKGs overnight, repeat CXR, no chest pain. Pts pain managed with PRN dilaudid. Failed nursing swallow screen, pending SLP eval.  Taken 02/14/2024 0100  Patient /Family stated Goal: get some sleep  Note:

## 2024-02-14 NOTE — Interdisciplinary (Signed)
 Pt admitted to 3H 334. 2 RN skin check completed with Sharrie Rothman., RN. Pt with existing sacral wound, photo taken and attached to wound LDA.

## 2024-02-14 NOTE — Consults (Signed)
 INPATIENT CARDIOLOGY CONSULT, INITIAL NOTE    Consult Question: Elevated troponin, WMA    Requesting Provider: Granville Walden, MD    History of Present Illness:  Ali Mohl is a 73 year old male with a history of metastatic urothelial carcinoma, who is admitted to the MICU with hypoxemia, found to have septic shock from possible pulmonary source.      Pt states he does not have a cardiac history, does not really remember why he is admitted but denies any CP or palpitations.    Review of Systems:  Reviewed in detail and constitutional, CV, Respiratory, GI, GU, musculoskeletal, skin, neurologic all (-) unless noted in HPI above    Past Medical and Surgical History:  Past Medical History:   Diagnosis Date    Chronic back pain     Congenital hydronephrosis     Gout     Headache     Hematuria     HTN (hypertension) 12/10/2021    Kidney disease     Kidney stones     Major depressive disorder, single episode     Polyarthropathy or polyarthritis of multiple sites     Retinal detachment     Urethral stricture      Past Surgical History:   Procedure Laterality Date    CT INSERTION OF SUPRAPUBIC CATH  09/25/2015    NEPHRECTOMY Right 1955    APPENDECTOMY      COLONOSCOPY      CYSTOSCOPY      CYSTOSCOPY W/ LASER LITHOTRIPSY      OTHER SURGICAL HISTORY      Interstim 01/29/2011    SPINE SURGERY  09/21    Lumbar-sacral fusion    TRANSURETHRAL RESECTION OF PROSTATE         Allergies:  Allergies   Allergen Reactions    Sulfa Drugs Hives, Other and Unspecified       Medications:  Prior to Admission Medications   Prescriptions Last Dose Informant Patient Reported? Taking?   Artificial Tear Solution (SOOTHE XP OP)   Yes No   DULoxetine (CYMBALTA) 20 MG CR capsule   No No   Sig: Take 1 capsule (20 mg) by mouth 2 times daily.   Erdafitinib 3 MG TABS   No No   Sig: Take 6 mg by mouth daily.   HYDROmorphone (DILAUDID) 2 MG tablet   No No   Sig: Take 1 to 2 tablets (2mg  to 4mg ) by mouth every 4 hours as needed for moderate to severe  pain   Multiple Vitamins-Minerals (MULTIVITAMIN WITH MINERALS) TABS tablet   No No   Sig: Take 1 tablet by mouth daily.   Nutritional Supplements (NEPRO) LIQD   No No   Sig: Take 3 Cans by mouth daily.   allopurinol (ZYLOPRIM) 100 MG tablet   No No   Sig: Take 1 tablet (100 mg) by mouth daily.   aluminum-magnesium-simethicone (MAG-AL PLUS) 200-200-20 MG/5ML suspension   No No   Sig: Mix viscous lidocaine 2%, Mylanta, and Benadryl liquid into a 1:1:1 solution.  Take 10mL by mouth every 4 hours as needed for pain.  Swish and spit or swallow for pain. If swallowed, wait 10 minutes prior to eating/drinking. May also apply medication to a Q-tip and dab on areas of sores.   busPIRone (BUSPAR) 10 MG tablet   No No   Sig: Take 2 tablets (20 mg) by mouth 2 times daily.   diphenhydrAMINE (BENADRYL) 12.5 MG/5ML liquid   No No  Sig: Mix viscous lidocaine 2%, Mylanta, and Benadryl liquid into a 1:1:1 solution.  Take 10mL by mouth every 4 hours as needed for pain.  Swish and spit or swallow for pain. If swallowed, wait 10 minutes prior to eating/drinking. May also apply medication to a Q-tip and dab on areas of sores.   fluticasone propionate (FLONASE) 50 MCG/ACT nasal spray   No No   Sig: Spray 2 sprays into each nostril daily.   gabapentin (NEURONTIN) 300 MG capsule   No No   Sig: Take 1 capsule (300 mg) by mouth 3 times daily.   ketotifen (ALAWAY) 0.025 % ophthalmic solution   Yes No   Sig: Place 1 drop into both eyes 2 times daily.   lidocaine (XYLOCAINE) 2 % solution   No No   Sig: Mix viscous lidocaine 2%, Mylanta, and Benadryl liquid into a 1:1:1 solution.  Take 10mL by mouth every 4 hours as needed for pain.  Swish and spit or swallow for pain. If swallowed, wait 10 minutes prior to eating/drinking. May also apply medication to a Q-tip and dab on areas of sores.   naloxone (NARCAN) 4 mg/0.1 mL nasal spray   No No   Sig: For suspected opioid overdose, call 911! Then spray once in one nostril. Repeat after 3 minutes if no  or minimal response using a new spray in other nostril.   nystatin (MYCOSTATIN) 100,000 units/mL suspension   No No   Sig: 5 mL (500,000 Units) by Swish & Swallow route 4 times daily.   pembrolizumab (KEYTRUDA) 100 MG/4ML SOLN   Yes No   polyethylene glycol (GLYCOLAX) 17 GM/SCOOP powder   No No   Sig: Take 17 grams (1 capful) by mouth daily. Mix with 4 to 8 ounces of fluid (water, juice, soda, coffee, or tea) prior to administration.   prochlorperazine (COMPAZINE) 10 MG tablet   No No   Sig: Take 1 tablet (10 mg) by mouth every 6 hours as needed (Nausea/Vomiting).   senna (SENOKOT) 8.6 MG tablet   No No   Sig: Take 2 tablets (17.2 mg) by mouth at bedtime.   tizanidine (ZANAFLEX) 2 MG tablet   No No   Sig: Take 1 tablet (2 mg) by mouth every 8 hours as needed (muscle spasm).   traZODone (DESYREL) 50 MG tablet   No No   Sig: Take one tab nightly by mouth at bedtime as needed for insomnia      Facility-Administered Medications: None       Cardiac Medications:    norepinephrine 8 mg in sodium chloride 0.9% 250 mL, 0-50 mcg/min, IntraVENOUS    Social History:  He reports that he has never smoked. He has never been exposed to tobacco smoke. He has never used smokeless tobacco.  He reports no history of alcohol use.  He reports that he does not currently use drugs.    Family History:  Family History   Adopted: Yes   Family history unknown: Yes       Objective      Vitals  Temperature:  [96 F (35.6 C)-99.2 F (37.3 C)] 96 F (35.6 C) (02/26 0800)  Blood pressure (BP): (69-132)/(42-91) 117/60 (02/26 1100)  Heart Rate:  [47-80] 59 (02/26 1100)  Respirations:  [10-24] 11 (02/26 1100)  Pain Score: 0 (02/26 1100)  O2 Device: Nasal cannula (02/26 1100)  O2 Flow Rate (L/min):  [2 l/min-15 l/min] 5 l/min (02/26 0955)  SpO2:  [84 %-100 %] 99 % (02/26 1100)  Intake/Output Summary (Last 24 hours) at 02/14/2024 1235  Last data filed at 02/14/2024 1200  Gross per 24 hour   Intake 886.79 ml   Output 3800 ml   Net -2913.21 ml        Physical Exam  Constitutional: Cooperative, well developed, well nourished, no acute distress  HEENT: NCAT, EOMI  Neck: supple, JVP flat, no bruits  Cardiac: normal rate, regular rhythm, no murmurs, rubs, or gallops  Respiratory: clear to auscultation bilaterally, normal work of breathing on room air  Abdomen: Soft, non tender, bowel sounds present, no rebound or guarding  Skin: No rashes  Extremities: No peripheral edema, WWP, 2+ bilateral peripheral pulses  Neuro: No gross motor or sensory deficits, alert and responding to questions appropriately    Labs  Recent Labs     02/13/24  0226 02/14/24  0404   NA 135* 142   K 4.1 4.0   CL 98 103   BICARB 29 29   BUN 30* 24*   CREAT 0.88 0.99   [place] 9.8 9.9   MG  --  1.8   PHOS  --  5.0*   TP 5.7*  --    ALB 2.6*  --    TBILI 0.71  --    DBILI 0.2  --    AST 34  --    ALT 29  --    ALK 115  --        Recent Labs     02/13/24  0226 02/14/24  0404   WBC 6.5 9.9   HGB 9.6* 8.2*   HCT 28.8* 25.3*   MCV 89.7 92.3   PLT 230 309   SEG 79.9 76.4   LYMPHS 12.5 13.9   MONOS 6.6 8.0   EOS 0.3 0.7       No results for input(s): "PTT", "INR" in the last 72 hours.    Recent Labs     02/13/24  0657 02/13/24  2059 02/14/24  0116 02/14/24  1015   CPK 28  --  37 38   CKMBH 2  --  4 6*   TROPONIN 116* 235* 405* 311*       Recent Labs     08/24/23  1306 09/21/23  1055 11/28/23  1428   TSH 0.75 2.51 0.40       Recent Labs     08/24/23  1306 09/21/23  1055   CHOL 132 130   TRIG 148 125       No results found for: "ARTPH", "ARTPO2", "ARTPCO2"    Cardiac Studies    ECG  NSR, frequent PVCs > 2 morphologies, subtle STD V5-V6    Echocardiogram 02/14/24   Summary:   1. The left ventricular size is mildly increased. The left ventricular systolic function is mildly depressed. Left ventricular ejection fraction by Simpson's biplane is 52 %.   2. Multiple segmental abnormalities exist. See findings.   3. The right ventricular size is normal and systolic function is normal.   4. Mild aortic valve  regurgitation.   5. Moderately elevated pulmonary artery pressure with right ventricular systolic pressure of 47 mmHg using an estimated right atrial pressure of 8 mmHg.   6. Compared to prior study LVEF is lower and segmental wall motion abnormalities are present in the entire apex and the mid anterior and anteroseptal walls. RVSP is higher (previously 21 mmHg).   7. Primary team was notified of results.    Echocardiogram 08/28/2023  Summary:  1. The left ventricular size is normal. The left ventricular systolic function is normal. Left ventricular ejection fraction by Simpson's biplane is 59 %.   2. The right ventricular size is normal and systolic function is normal.   3. Normal pulmonary artery pressure with right ventricular systolic pressure of 21 mmHg using an estimated right atrial pressure of 3 mmHg.   4. Dilated aortic root measuring 3.70 cm and 2.03 cm/m index.   5. Dilated ascending aorta measuring 4.10 cm and 2.25 cm/m index.   6. No significant valvular abnormalities.   7. Compared to prior study, no signficant change on side by side comparison.    Assessment and Plan     Zhion Pevehouse is a 73 year old male with metastatic urothelial carcinoma, who is admitted to the MICU with hypoxemia, found to have septic shock from possible pulmonary source. Cardiology consulted for elevated troponin and new WMA.    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 In addition, the echocardiogram with antero-apical WMA is suggestive of Takotsubo CM. Would recommend ongoing treatment of sepsis, repeat TTE prior to discharge to re-evaluate WMA. If not improved, would consider ischemic evaluation at this time (does have significant pLAD CAC on CT chest).    Recommendations:  - start ASA 81 daily for now  - defer  hep gtt  - repeat TTE prior to discharge, if rWMA are not improved will consider ischemic evaluation at that time  - once off vasopressors can start trial of GDMT, start with spironolactone 12.5 and losartan 12.5 daily   - we will sign off for now, please page [name] with any abnormalities on repeat TTE    This patient was discussed with Cardiology attending  Dr. Doran Clay who agrees with the above assessment and plan. Discussed recommendations with primary team.    Thank you for this interesting consult. We will sign off, please reach out with any questions.    Sharrell Ku, MD, PhD   Cardiovascular Disease Fellow

## 2024-02-14 NOTE — Consults (Signed)
 Wound/Ostomy RN Consult  Photos and chart reviewed  Telemed consult     Admit date: 02/13/2024    Stephen [name] is a 73 year old male  Unit: ZO109/UE454    Sulfa drugs  Reason for consult: Initial Wound  Wound/Ostomy Type: Wound   Present on Admission:  yes, coccyx  Admit diagnosis: Sepsis (CMS-HCC) [A41.9]  Decreased activities of daily living (ADL) [Z78.9]  Sepsis, due to unspecified organism, unspecified whether acute organ dysfunction present (CMS-HCC) [A41.9]    Per H&P:   Please see housestaff note from today which I mostly agree with.  To summarize:  Patient is a 73 y.o. M with metastatic urothelial carcinoma, OA, HTN, MDD, and gout who presents from a skilled nursing facility for malaise, hypoxia and bradycardia. He was coughing phlegm at that time that was thick and gray in color. He is not coughing as much but still has some. It's more dry now. He was given amoxicillin as an outpatient. He denies fevers, chills or coughing up blood. He does have some muscle aches. He denies chest pain or palpitations. He denies nausea or vomiting. He has a PCN for urination and denies issues with this. He denies abdominal pain. He denies new rashes. He has a right-sided chest port. He has a little swelling in his left leg. He came in today as his blood pressure and heart rate were low. He reports feeling tired at the time but he was sleeping at the time. His heart rate is typically in the 60s. He doesn't have any prior smoking or pulmonary history. He denies sick contacts at the nursing facility. He has some dry mouth syndrome so he does cough from that as well. He denies a loss of appetite but is eating less. He has been losing weight. 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     GOC - he reports that he has seen palliative care and has had sepsis in the past. He doesn't really want to be on a vent but would accept it to save his life. He would want CPR as well. If he is unable to speak for himself, he would want  his sister as the Management consultant.     Medical Hx   Past Medical History:   Diagnosis Date    Chronic back pain     Congenital hydronephrosis     Gout     Headache     Hematuria     HTN (hypertension) 12/10/2021    Kidney disease     Kidney stones     Major depressive disorder, single episode     Polyarthropathy or polyarthritis of multiple sites     Retinal detachment     Urethral stricture        CBC  Recent Labs     02/13/24  0226 02/14/24  0404   WBC 6.5 9.9   RBC 3.21* 2.74*   HGB 9.6* 8.2*   HCT 28.8* 25.3*   MCV 89.7 92.3   MCH 29.9 29.9   MCHC 33.3 32.4   RDW 14.6* 14.7*   MPV 8.9* 9.2*   PLT 230 309   SEG 79.9 76.4   LYMPHS 12.5 13.9   MONOS 6.6 8.0   EOS 0.3 0.7   BASOS 0.2 0.4       Chemistry  Recent Labs     02/13/24  0226 02/14/24  0404   NA 135* 142   K 4.1 4.0   CL 98 103  BICARB 29 29   BUN 30* 24*   CREAT 0.88 0.99   GLU 134* 139*   [place] 9.8 9.9   MG  --  1.8   PHOS  --  5.0*       LFTs  Recent Labs     02/13/24  0226   ALK 115   AST 34   ALT 29   TBILI 0.71   DBILI 0.2   ALB 2.6*       Coags  Recent Labs     02/14/24  1308   PT 14.5*   PTT 29   INR 1.4       No results for input(s): "PREALB" in the last 2160 hours.    No results for input(s): "A1C" in the last 2160 hours.    Blood Culture (no units)   Date Value   01/12/2022     Enterococcus faecalis  Anaerobic Bottle:  For susceptibility results refer to culture #[id number]  Collected date:01/10/22  Collected time:1558  .  Identification performed by [employer] [facility]( Maldi-ToF). This test  was developed and its performance characteristics determined by [facility]. It has not been cleared or  approved by the U.S. Food and Drug Administration.  The FDA has  determined that such clearance or approval is not necessary.         Surgical History   Past Surgical History:   Procedure Laterality Date    CT INSERTION OF SUPRAPUBIC CATH  09/25/2015    NEPHRECTOMY Right 1955    APPENDECTOMY      COLONOSCOPY      CYSTOSCOPY       CYSTOSCOPY W/ LASER LITHOTRIPSY      OTHER SURGICAL HISTORY      Interstim 01/29/2011    SPINE SURGERY  09/21    Lumbar-sacral fusion    TRANSURETHRAL RESECTION OF PROSTATE         Current Nutrition:      Diet Status: NPO    Braden Score: 15    Bed Surface: Isoflex low air loss with pump    Last Physical Therapy date:      Last Occupational Therapy date:  60 (02/13/24 1200)    Image:        Wound Type:  POA Mixed IAD/Stage 1 Pressure Injury    Comments:  Consult placed for sacrococcygeal wound.  Chart and photos reviewed.      Current Goal of Treatment: pressure injury prevention    Wound Care Recommendations:   Sacral wound care  -clean sacral/coccyx area gently pat dry  -Apply skin prep to redness (allow 10-20 seconds to dry)  -place  Optifoam sacral dressing  -Change every 3 days for intact skin.  -Lift dressing to inspect skin Q shift      -Waffle seat cushion to be placed when out of bed in Chair.  Obtain from [facility]  QMVH#[id number]  -Turning q 2 hours and prn  -Add Low air loss pump to bed  - Green foam wedge to assist with turns  [place]# [id number]  -Z-flow pillow to offload head/ears  [place]# [id number]  -Comfort glide sheet or Hover Mat to be placed.  Obtain from [facility] comfort glide sheet [place]# [id number]  -Offloading boots to offload heels while in bed  Obtain from [facility] GUYQ#[id number]      Findings and recommendations communicated to: Staff RN    Plan: Will sign off. Reconsult for new or deteriorating wounds    Wound Care nurse:  Virgina Norfolk RN,  BSN, CWON  Certified Wound Ostomy Nurse   [facility]  Tel#[phone]

## 2024-02-14 NOTE — Interdisciplinary (Signed)
 02/14/24 1043   Readmission Risk Assessment   Where was the patient admitted from? * Home   Readmission Within 30 Days of Discharge * No   Recent Hospitalizations (Within Last 6 Months) * No   High Risk For Readmission * No   Patient Information   Primary Family/Caregiver Contact Name, Number and Relationship * Colman Cater - sister [phone])   Permission to Contact * Not Applicable   Stairs/Steps to home * No   Address on Facesheet correct?* Yes   Patient contact phone number on Facesheet correct? * Yes   PCP listed on Facesheet correct? * Yes   Assistive Device * Walker;Cane   Home Care Services * No   Patient's Discharge Goal(s) * Home   Income Information   Do you have difficulty affording your medications * No   Patient Transportation   Do You Have Transportation Issues/Concerns That Make It Difficult To Get To Your Appointments? * No   Will someone be coming to the hospital to take you home upon discharge?  No   Discharge   Anticipated Discharge Disposition/Needs * Too soon to be determined   Barriers to Discharge * None   Family/Caregiver's Assessed for * Not Applicable   Respite Care * Not Applicable   Food Insecurity   Within the past 12 months, you worried that your food would run out before you got the money to buy more. Never true   Within the past 12 months, the food you bought just didn't last and you didn't have money to get more. Never true   Transportation Needs   In the past 12 months, has lack of transportation kept you from medical appointments or from getting medications? no   In the past 12 months, has lack of transportation kept you from meetings, work, or from getting things needed for daily living? No   Housing Stability   In the last 12 months, was there a time when you were not able to pay the mortgage or rent on time? N   In the past 12 months, how many times have you moved where you were living? 0   At any time in the past 12 months, were you homeless or living in a shelter  (including now)? N   Utilities   In the past 12 months has the electric, gas, oil, or water company threatened to shut off services in your home? No   Social Worker Consult   Do you need to see a Child psychotherapist? * No   Initial Assessment   CM Initial Assessment * Completed         Medical Necessity:  Septic shock   - bcx, urine culture pending   - s/p 2L IVF in ED, fluctuating BPs   - low dose levophed to maintain MAPs > 65  - zosyn/azithromycin for now  - CXR w/ worsening R opacities, previously tried amoxicillin without improvement   - Flu/COVID negative   - RPNA pending   - consider CT chest if no improvement    LOS at time of Initial Assessment: 1 Day 8 Hours  Pt admitted on 02/13/2024  2:09 AM    LACE+ Score: 57    Address verified as discharge address:   [address]  [place] [address] [phone] (home)    PCP verified:  [facility]inic, [facility]  [address] / [place] [address]  telephone [phone]  fax [phone]    Pharmacy:  [facility] [phone] -  [place], [place] - [address]    Initial CM Assessment :  Initial DCP assessment discussed w/ pt. at bedside. Confirmed phone number and address on face sheet. Pt. admitted from [facility], still undecided if he wants to go back to same SNF.     PCP: Dr. Alycia Rossetti  DME: FWW and cane   DM: No  HD: No  HH: [facility]   SNF: [facility]   Transportation: TBD. BLS if DC back to SNF.     Anticipated DC needs : DC need to be determined pending clinical course and final recommendations from Primary team.       CM will continue to assess for post discharge needs.   Durable Medical Equipment: to be reassessed when ready for discharge. PT/OT eval pending.  Home with home health: to be reassessed when ready for discharge.   Skilled Nursing Facility: for needs assessed by the team to be reassessed when ready for discharge.     Orlene Plum, RN CM   Care Manager

## 2024-02-15 ENCOUNTER — Inpatient Hospital Stay (HOSPITAL_BASED_OUTPATIENT_CLINIC_OR_DEPARTMENT_OTHER): Payer: Medicare Other

## 2024-02-15 DIAGNOSIS — Z452 Encounter for adjustment and management of vascular access device: Secondary | ICD-10-CM

## 2024-02-15 DIAGNOSIS — R918 Other nonspecific abnormal finding of lung field: Secondary | ICD-10-CM

## 2024-02-15 LAB — ANTI XA UF (UNFRACTIONATED HEPARIN)
Anti Xa UF (Unfractionated Heparin): 0.2 [IU]/mL
Anti Xa UF (Unfractionated Heparin): 0.22 [IU]/mL

## 2024-02-15 LAB — BASIC METABOLIC PANEL, BLOOD
Anion Gap: 8 mmol/L (ref 7–15)
BUN: 21 mg/dL (ref 8–23)
Bicarbonate: 31 mmol/L — ABNORMAL HIGH (ref 22–29)
Calcium: 9.5 mg/dL (ref 8.5–10.6)
Chloride: 103 mmol/L (ref 98–107)
Creatinine: 1.06 mg/dL (ref 0.67–1.17)
Glucose: 147 mg/dL — ABNORMAL HIGH (ref 70–99)
Potassium: 3.7 mmol/L (ref 3.5–5.1)
Sodium: 142 mmol/L (ref 136–145)
eGFR Based on CKD-EPI 2021 Equation: >60 mL/min/{1.73_m2}

## 2024-02-15 LAB — CBC WITH DIFF, BLOOD
ANC-Automated: 6.2 10*3/uL (ref 1.6–7.0)
Abs Basophils: 0.1 10*3/uL (ref <0.2)
Abs Eosinophils: 0.2 10*3/uL (ref 0.0–0.5)
Abs Lymphs: 1.3 10*3/uL (ref 0.8–3.1)
Abs Monos: 0.7 10*3/uL (ref 0.2–0.8)
Basophils: 0.6 %
Eosinophils: 2 %
Hct: 26.2 % — ABNORMAL LOW (ref 40.0–50.0)
Hgb: 8.4 g/dL — ABNORMAL LOW (ref 13.7–17.5)
Imm Gran %: 0.6 % (ref <1)
Imm Gran Abs: 0.1 10*3/uL — ABNORMAL HIGH (ref <0.1)
Lymphocytes: 15.6 %
MCH: 29.8 pg (ref 26.0–32.0)
MCHC: 32.1 g/dL (ref 32.0–36.0)
MCV: 92.9 um3 (ref 79.0–95.0)
MPV: 9.1 fL — ABNORMAL LOW (ref 9.4–12.4)
Monocytes: 8.5 %
Plt Count: 300 10*3/uL (ref 140–370)
RBC: 2.82 10*6/uL — ABNORMAL LOW (ref 4.60–6.10)
RDW: 14.9 % — ABNORMAL HIGH (ref 12.0–14.0)
Segs: 72.7 %
WBC: 8.6 10*3/uL (ref 4.0–10.0)

## 2024-02-15 LAB — HEMOGRAM, BLOOD
Hct: 26 % — ABNORMAL LOW (ref 40.0–50.0)
Hct: 25.2 % — ABNORMAL LOW (ref 40.0–50.0)
Hgb: 8.3 g/dL — ABNORMAL LOW (ref 13.7–17.5)
Hgb: 8.1 g/dL — ABNORMAL LOW (ref 13.7–17.5)
MCH: 29.9 pg (ref 26.0–32.0)
MCH: 29.9 pg (ref 26.0–32.0)
MCHC: 31.9 g/dL — ABNORMAL LOW (ref 32.0–36.0)
MCHC: 32.1 g/dL (ref 32.0–36.0)
MCV: 93.5 um3 (ref 79.0–95.0)
MCV: 93 um3 (ref 79.0–95.0)
MPV: 9.2 fL — ABNORMAL LOW (ref 9.4–12.4)
MPV: 8.8 fL — ABNORMAL LOW (ref 9.4–12.4)
Plt Count: 295 10*3/uL (ref 140–370)
Plt Count: 280 10*3/uL (ref 140–370)
RBC: 2.78 10*6/uL — ABNORMAL LOW (ref 4.60–6.10)
RBC: 2.71 10*6/uL — ABNORMAL LOW (ref 4.60–6.10)
RDW: 14.8 % — ABNORMAL HIGH (ref 12.0–14.0)
RDW: 15 % — ABNORMAL HIGH (ref 12.0–14.0)
WBC: 8.7 10*3/uL (ref 4.0–10.0)
WBC: 8.7 10*3/uL (ref 4.0–10.0)

## 2024-02-15 LAB — MAGNESIUM, BLOOD: Magnesium: 2 mg/dL (ref 1.6–2.4)

## 2024-02-15 LAB — LACTATE, BLOOD: Lactate: 1.1 mmol/L (ref 0.5–2.0)

## 2024-02-15 LAB — VANCOMYCIN, RANDOM: Vancomycin, Random: 22 ug/mL

## 2024-02-15 LAB — TROPONIN T GEN 5
Troponin T Gen 5: 184 ng/L (ref <22)
Troponin T Gen 5: 151 ng/L (ref <22)

## 2024-02-15 LAB — PHOSPHORUS, BLOOD: Phosphorous: 4.2 mg/dL (ref 2.7–4.5)

## 2024-02-15 MED ORDER — HEPARIN (PORCINE) IN NACL 25000 UNITS/250ML 0.45% NACL IV SOLN (~~LOC~~ CUSTOM)
INTRAVENOUS | Status: DC
Start: 2024-02-15 — End: 2024-02-17
  Administered 2024-02-15 (×3): 865 [IU]/h via INTRAVENOUS
  Administered 2024-02-15 (×5): 800 [IU]/h via INTRAVENOUS
  Administered 2024-02-15: 865 [IU]/h via INTRAVENOUS
  Administered 2024-02-15 (×3): 800 [IU]/h via INTRAVENOUS
  Administered 2024-02-16: 1125 [IU]/h via INTRAVENOUS
  Administered 2024-02-16: 995 [IU]/h via INTRAVENOUS
  Administered 2024-02-16: 865 [IU]/h via INTRAVENOUS
  Administered 2024-02-16: 995 [IU]/h via INTRAVENOUS
  Administered 2024-02-16: 1255 [IU]/h via INTRAVENOUS
  Administered 2024-02-16: 1125 [IU]/h via INTRAVENOUS
  Administered 2024-02-16: 995 [IU]/h via INTRAVENOUS
  Administered 2024-02-16: 1125 [IU]/h via INTRAVENOUS
  Administered 2024-02-16: 995 [IU]/h via INTRAVENOUS
  Administered 2024-02-16: 1255 [IU]/h via INTRAVENOUS
  Administered 2024-02-16: 865 [IU]/h via INTRAVENOUS
  Administered 2024-02-16 (×2): 1125 [IU]/h via INTRAVENOUS
  Administered 2024-02-16: 995 [IU]/h via INTRAVENOUS
  Administered 2024-02-16: 1255 [IU]/h via INTRAVENOUS
  Administered 2024-02-16: 1125 [IU]/h via INTRAVENOUS
  Administered 2024-02-16 (×2): 995 [IU]/h via INTRAVENOUS
  Administered 2024-02-16: 1125 [IU]/h via INTRAVENOUS
  Administered 2024-02-16 (×2): 995 [IU]/h via INTRAVENOUS
  Administered 2024-02-16: 865 [IU]/h via INTRAVENOUS
  Administered 2024-02-16: 995 [IU]/h via INTRAVENOUS
  Administered 2024-02-17: 1385 [IU]/h via INTRAVENOUS
  Administered 2024-02-17: 1320 [IU]/h via INTRAVENOUS
  Administered 2024-02-17: 1255 [IU]/h via INTRAVENOUS
  Administered 2024-02-17 (×2): 1320 [IU]/h via INTRAVENOUS
  Administered 2024-02-17: 1255 [IU]/h via INTRAVENOUS
  Administered 2024-02-17 (×5): 1320 [IU]/h via INTRAVENOUS
  Administered 2024-02-17: 1255 [IU]/h via INTRAVENOUS
  Administered 2024-02-17 (×2): 1320 [IU]/h via INTRAVENOUS
  Administered 2024-02-17: 1385 [IU]/h via INTRAVENOUS
  Filled 2024-02-15 (×3): qty 250

## 2024-02-15 MED ORDER — ATROPINE SULFATE 1 MG/10ML IJ SOSY
0.5000 mg | PREFILLED_SYRINGE | Freq: Once | INTRAMUSCULAR | Status: AC | PRN
Start: 2024-02-15 — End: ?
  Filled 2024-02-15: qty 10

## 2024-02-15 MED ORDER — POTASSIUM CHLORIDE 20 MEQ/50ML IV SOLN
20.0000 meq | INTRAVENOUS | Status: AC
Start: 2024-02-15 — End: 2024-02-15
  Administered 2024-02-15 (×2): 20 meq via INTRAVENOUS
  Filled 2024-02-15 (×2): qty 50

## 2024-02-15 MED ORDER — PROCHLORPERAZINE EDISYLATE 5 MG/ML IJ SOLN WRAPPED RECORD
10.0000 mg | Freq: Four times a day (QID) | INTRAMUSCULAR | Status: AC | PRN
Start: 2024-02-15 — End: ?
  Administered 2024-02-16 – 2024-02-22 (×12): 10 mg via INTRAVENOUS
  Filled 2024-02-15 (×12): qty 2

## 2024-02-15 MED ORDER — HEPARIN IV BOLUS FROM BAG
60.0000 [IU]/kg | Freq: Once | INTRAMUSCULAR | Status: DC
Start: 2024-02-15 — End: 2024-02-15

## 2024-02-15 MED ORDER — HEPARIN (PORCINE) IN NACL 25000 UNITS/250ML 0.45% NACL IV SOLN (~~LOC~~ CUSTOM)
INTRAVENOUS | Status: DC
Start: 2024-02-15 — End: 2024-02-15

## 2024-02-15 MED ORDER — HEPARIN IV BOLUS FROM BAG
60.0000 [IU]/kg | Freq: Once | INTRAMUSCULAR | Status: AC
Start: 2024-02-15 — End: 2024-02-15
  Administered 2024-02-15: 4000 [IU] via INTRAVENOUS

## 2024-02-15 MED ORDER — BISACODYL 10 MG RE SUPP
10.0000 mg | Freq: Three times a day (TID) | RECTAL | Status: AC | PRN
Start: 2024-02-15 — End: ?
  Administered 2024-02-15: 10 mg via RECTAL
  Filled 2024-02-15: qty 1

## 2024-02-15 NOTE — Progress Notes (Signed)
 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 He has been residing at the [facility] and developed a productive cough 1-2 weeks ago, reportedly had CXR done showing pneumonia and was given amoxicillin. He does not know if he finished the course of antibiotics. His cough has since improved and is now an intermittent dry cough. Denies overt dyspnea, chest pain, palpitations. Denies nausea/vomiting. No fevers, chills, rigors. He reports being woken up at his SNF today for low blood pressure but denies syncope, presyncopal symptoms. Patient recently admitted to [facility] 1/14-1/22 for worsening back pain thought to be cancer related pain - this is still ongoing.    Events:  2/26--remains on pressors; troponin increased and echo had WMA; cardiology consulted   2/27--had bradyarrythmia today with NSVT; heparin gtt started; pressors increased    Pertinent Meds:    allopurinol  100 mg Daily    aspirin  81 mg Daily    busPIRone  20 mg BID    enoxaparin  40 mg Daily    Erdafitinib  6 mg Daily    famotidine  20 mg Daily    fluticasone propionate  2 spray Daily    gabapentin  300 mg TID    multivitamin with minerals  1 tablet Daily    piperacillin-tazobactam (ZOSYN) IVPB  4,500 mg Q8H    polyethylene glycol  17 g Daily    senna  17.2 mg HS    sodium chloride  10 mL Q8H    sodium chloride  3 mL Q8H    vancomycin (VANCOCIN) IVPB  1,000 mg Q12H    venlafaxine  50 mg BID        norepinephrine 8 mg in sodium chloride 0.9% 250 mL  0-50 mcg/min 7.5 mL/hr at 02/15/24 0701 4 mcg/min at 02/15/24 0701    sodium chloride           Allergies:   Allergies   Allergen Reactions    Sulfa Drugs Hives, Other and  Unspecified     Exam:  Temperature:  [97.7 F (36.5 C)-98.7 F (37.1 C)] 98.5 F (36.9 C) (02/27 0000)  Blood pressure (BP): (80-152)/(41-125) 84/57 (02/27 0700)  Heart Rate:  [37-76] 76 (02/27 0600)  Respirations:  [8-22] 19 (02/27 0600)  Pain Score: 5 (02/27 0600)  O2 Device: Nasal cannula (02/27 0700)  O2 Flow Rate (L/min):  [3 l/min-7 l/min] 6 l/min (02/27 0700)  SpO2:  [90 %-100 %] 90 % (02/27 0600)    Intake/Output Summary (Last 24 hours) at 02/15/2024 1641  Last data filed at 02/15/2024 1513  Gross per 24 hour   Intake 906.04 ml   Output 2175 ml   Net -1268.96 ml     GEN: elderly appearing, NAD  CARDS: RRR, brady  RESP: decreased BS bases  ABD: Soft, NT/ND, NABS  MSK/EXT: WWP, trace pitting edema L>R     Pertinent Labs: Reviewed.  Na 142 (02/27) CL 103 (02/27) BUN 21 (02/27) GLU   147* (02/27)   K 3.7 (02/27) CO2 31* (02/27) Cr 1.06 (02/27)      WBC 8.6 (02/27) HGB 8.4* (  02/27) PLT 300 (02/27)    HCT 26.2* (02/27)      Micro Data:   Lab Results   Component Value Date    BLOODCULT (A) 01/12/2022     Enterococcus faecalis  Anaerobic Bottle:  For susceptibility results refer to culture #[id number]  Collected date:01/10/22  Collected time:1558  .  Identification performed by [employer]( [facility]). This test  was developed and its performance characteristics determined by [facility]. It has not been cleared or  approved by the U.S. Food and Drug Administration.  The FDA has  determined that such clearance or approval is not necessary.      URINECULTURE (A) 11/28/2023     Enterococcus faecalis  >100,000 CFU/mL  Identification performed by [employer]( [facility]). This test  was developed and its performance characteristics determined by [facility]. It has not been cleared or  approved by the U.S. Food and Drug Administration.  The FDA has  determined that such clearance or approval is not necessary.      URINECULTURE (A) 11/28/2023      Gram-negative bacillus  50,000 - 100,000 CFU/mL  (Mixta intestinalis)  .  Identification performed by [employer]( [facility]). This test  was developed and its performance characteristics determined by [facility]. It has not been cleared or  approved by the U.S. Food and Drug Administration.  The FDA has  determined that such clearance or approval is not necessary.  .  Susceptibility testing performed at:  [facility]  [phone], [email or web address]   [address]   [place], [place] [address]  Evans Lance, MD, PhD, Chief Medical Officer         Results for orders placed or performed in visit on 09/21/23   HIV 1/2 Antibody & P24 Antigen Assay   Result Value Ref Range    HIV 1/2 Antibody & P24 Antigen Assay Non Reactive      *Note: Due to a large number of results and/or encounters for the requested time period, some results have not been displayed. A complete set of results can be found in Results Review.       Pertinent Imaging: Reviewed.  2/25 CXR     Unchanged positioning of the right IJ MediPort with the tip projecting over the mid SVC.     The cardiac silhouette is within normal limits     Interval development of right perihilar ill-defined opacities, given the provided history, concerning for pneumonia. No sizable pleural effusion or pneumothorax.     The visualized osseous structures demonstrate no acute abnormality. Degenerative changes of the spine.    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 Course c/b NSTEMI with evidence of WMA on echo--?stress cardiomyopathy.     (1) shock -- septic +/- cardiac  (2) pneumonia--likely aspiration     --f/u cultures, cont vanco and zosyn  --cont pressors to keep MAP > 65  --hold on more fluids given  hypoxemia and HFrEF    (3) NSTEMI with HFrEF    --cont Aspirin  --cont heparin gtt given concern for ongoing ischemia  --repeat echo once clinically improved and start aldactone and losartan as per cardiology    (4) metastatic urothelial cancer     --  followed by Dr. Roseanne Reno    ICU:  --pepcid  --cont TFs given high aspiration risk     This patient is critically ill requiring critical care services due to vital organ system dysfunction that includes the following organ system(s); cardiovascular.    These problems include or stem from  shock .    My critical care of these organ system problems is required to assess, manipulate, and support vital organ function to prevent further life-threatening deterioration of the patient's condition including titration of pressors.     Medical complexity of this patient's current illness is high.    Patient's risk of deterioration and/or death is high.    Total time I spent providing critical care to this patient today exclusive of separately reportable procedures: 45 minutes    Granville Gibbon, MD

## 2024-02-15 NOTE — Interdisciplinary (Signed)
 Nutrition Note      Evaluation Type: Initial    Recommendations:    -Patient meets criteria for Severe Malnutrition    Continuous Tube Feeding Recommendation     Peptamen 1.5 w/Prebio goal @ 60 mL/hr x 24 hr/day.   Start at 20 mL/hr and increase by 10 mL every 4 hrs to goal rate.  Other Recommended Tube Feeding Regimen: ProSource daily  Provides Daily: 2160 kcals, 98 g protein, 276.48 g carbs, 1109 mL free water, 1440 mL total volume (with prosource daily: 2240kcals, 118gm pro)    Suggested Free Water Flush: 30 mL Q4h or per MD                                                                                                                                             A:  Pt admin 2/2 sepsis r/t PNA from ECF w/ hypoxia. Metastatic urothelial [place] noted recurrent cystitis s/p L nephrostomy. Noted hypotension (MAP 81). +BS, constipation, St I pressure ulcer.    Nutrition Summary   Current Nutrition Regimen: Peptamen AF @40ml /hr  Source of Information: Chart Review;Spoke with Nurse  Barriers to Intake: NPO Status  Additional Comments: Pt admin 2/2 sepsis r/t PNA from ECF w/ hypoxia. Metastatic urothelial [place] noted recurrent cystitis s/p L nephrostomy. Noted hypotension (MAP 81). +BS, constipation, St I pressure ulcer.  Adequacy of Nutrition Intake: Meeting <25% of estimated needs    No data found.  No data found.    Anthropometrics   Height - Most Recent Measurement   02/13/24 5\' 6"  (1.676 m)       Weight For Nutrition Equations: 66.6 kg (146 lb 13.2 oz)     BMI for Nutrition Calculations: 23.7  Ideal Body Weight (kg): 64.36  Percent of Ideal Body Weight: 103.48 %  Usual Body Weight (Dietary): 73.5 kg (162 lb 0.6 oz)  Change from UBW (%): -9.39 %  Time Frame of Weight Change From UBW: 1 month    Weights (last 14 days)       Date/Time Weight Weight Source Percentage Weight Change (%) Who    02/14/24 0000 66.6 kg (146 lb 13.2 oz) Bed scale -1.33 % EA    02/13/24 0214 67.5 kg (148 lb 13 oz) Bed scale 0 % JG              Estimated Needs  Calories: 1998 kcal/day - 2331 kcal/day (30 kcal/kg/day - 35 kcal/kg/day x 66.6 kg (146 lb 13.2 oz))  Protein: 100 g/day - 120 g/day (1.5 g/kg/day - 1.8 g/kg/day x 66.6 kg (146 lb 13.2 oz))  Fluids: 1665 mL/day - 1998 mL/day (25 mL/kg/day - 30 mL/kg/day x 66.6 kg (146 lb 13.2 oz)) or per MD    Huey Romans Equation: 1373      Nutrition Focused Physical Exam   Body Fat Loss    Orbital (!) Moderate (02/15/24 1657)  Upper Arm (!) Mild (02/15/24 1657)   Thoracic/Lumbar Within Defined Limits (02/15/24 1657)   Muscle Mass Loss    [place]ple (!) Severe (02/15/24 1657)   Clavicle Bone Region (!) Severe (02/15/24 1657)   Deltoid Within Defined Limits (02/15/24 1657)   Scapula Bone Region Within Defined Limits (02/15/24 1657)   Interosseous Within Defined Limits (02/15/24 1657)   Anterior Thigh (!) Moderate (02/15/24 1657)   Patellar Region (!) Moderate (02/15/24 1657)   Posterior Calf (!) Moderate (02/15/24 1657)   Micronutrient Deficiency       Edema:         Malnutrition Diagnostic Criteria - Chronic Illness  Malnutrition Designation: Severe  Energy Intake: <75% for greater than or equal to 1 Month  Weight Loss: 5% in 1 Month  Fluid Accumulation: Not Applicable  Body Fat Loss: Moderate  Muscle Mass Loss: Severe  Chronic Dx Status: Ongoing    Clinical Considerations:   Allergies: Sulfa drugs  IV Access - Peripheral:     IV Access - Central  Port A Cath Single Lumen  - 02/13/24 Subclavian (Active)       PICC Double Lumen - 02/15/24 Right Basilic (Active)     Tubes and Drains:  Nephrostomy -  Left (Active)        GI:  Stool Assessment for the past 168 hrs:   Stool Amount Stool Occurrence Stool Color Stool Appearance Stool Source   02/14/24 0400 Smear 1 Brown Partially liquid Incontinent   02/15/24 0600 Smear 1 Brown -- --       Skin Integrity:  Skin Integrity (WDL): Exceptions to WDL       Wounds/Incisions:       Pressure Injuries:  Pressure Ulcer/ Injury -  Stage I (non-blanchable reddness over pressure  points) Coccyx (Active)       Labs: reviewed   Recent Labs     02/13/24  0226 02/14/24  0404 02/15/24  0200 02/15/24  0202   NA 135* 142  --  142   K 4.1 4.0  --  3.7   CL 98 103  --  103   BICARB 29 29  --  31*   BUN 30* 24*  --  21   CREAT 0.88 0.99  --  1.06   GLU 134* 139*  --  147*   [place] 9.8 9.9  --  9.5   MG  --  1.8  --  2.0   PHOS  --  5.0*  --  4.2   ALK 115  --   --   --    ALT 29  --   --   --    AST 34  --   --   --    TBILI 0.71  --   --   --    DBILI 0.2  --   --   --    ALB 2.6*  --   --   --    WBC 6.5 9.9 8.7 8.6   ABSNEUTRO 5.2 7.6*  --  6.2       Lab Results   Component Value Date    CHOL 130 09/21/2023    TRIG 125 09/21/2023       No results found for: "A1C"    Recent Labs     02/13/24  0235   GLUCPOCT 138*       Lab Results   Component Value Date    VITAMIND25HY 77 03/23/2023  Medication Review Comments:    IV:    heparin 25,000 units/250 mL   8 mL/hr at 02/15/24 1500 800 Units/hr at 02/15/24 1500    norepinephrine 8 mg in sodium chloride 0.9% 250 mL  0-50 mcg/min 1.9 mL/hr at 02/15/24 1647 1 mcg/min at 02/15/24 1647    sodium chloride           Scheduled:    allopurinol  100 mg Daily    aspirin  81 mg Daily    busPIRone  20 mg BID    Erdafitinib  6 mg Daily    famotidine  20 mg Daily    fluticasone propionate  2 spray Daily    gabapentin  300 mg TID    multivitamin with minerals  1 tablet Daily    piperacillin-tazobactam (ZOSYN) IVPB  4,500 mg Q8H    polyethylene glycol  17 g Daily    senna  17.2 mg HS    sodium chloride  10 mL Q8H    sodium chloride  3 mL Q8H    vancomycin (VANCOCIN) IVPB  1,000 mg Q12H    venlafaxine  50 mg BID       Discharge: pending clinical course    Education: when clinically appropriate     RD/DTR to monitor/evaluate: labs, weight trends, oral or nutrition support status, and signs and symptoms of new skin concerns. Relayed recommendations to MD.     Will continue to follow patient per approved [place] Nutrition Prioritization Schedule guidelines. Nutrition Services  remains available via IP Nutrition Consult should patient medical status change.    Georgiann Cocker, RD  02/15/2024

## 2024-02-15 NOTE — Interdisciplinary (Signed)
 PT Contact       Row Name 02/15/24 1431       Therapy Contact Note    Contact Time 1431    Therapy not provided at this time as Patient medically unstable and unable to participate.    Additional Comments Pt sinus bradycardiac, treatment team requesting hold on therapy. Will follow up pending medical stability, as schedule allows.

## 2024-02-15 NOTE — Progress Notes (Signed)
 INPATIENT CARDIOLOGY CONSULT, PROGRESS NOTE    DOA: 02/13/2024,  LOS: 2 days     Subjective and Interval Events:  - sinus bradycardia overnight followed by AIVR, ventricular escape, and short runs of polymorphic NSVT  - worsening shock this morning  - no CP  - WWP    Objective     Scheduled Meds:   allopurinol  100 mg Daily    aspirin  81 mg Daily    busPIRone  20 mg BID    Erdafitinib  6 mg Daily    famotidine  20 mg Daily    fluticasone propionate  2 spray Daily    gabapentin  300 mg TID    heparin  60 Units/kg Once    multivitamin with minerals  1 tablet Daily    piperacillin-tazobactam (ZOSYN) IVPB  4,500 mg Q8H    polyethylene glycol  17 g Daily    senna  17.2 mg HS    sodium chloride  10 mL Q8H    sodium chloride  3 mL Q8H    vancomycin (VANCOCIN) IVPB  1,000 mg Q12H    venlafaxine  50 mg BID       PRN Meds:    artificial tears  2 drop Q12H PRN    atropine  0.5 mg Once PRN    bisacodyl  10 mg Q8H PRN    diphenhydrAMINE  25 mg Q4H PRN    heparin  100 Units Once PRN    heparin  100 Units Once PRN    HYDROmorphone  1 mg Q4H PRN    lidocaine  5 mL Once PRN    nalOXone  0.1 mg Q2 Min PRN    prochlorperazine  10 mg Q6H PRN    sodium chloride  10 mL PRN    sodium chloride  3 mL PRN    sodium chloride   Continuous PRN    tizanidine  2 mg Q8H PRN    traZODone  50 mg Nightly PRN       Vitals:  Temp  Avg: 98.3 F (36.8 C)  Min: 97.7 F (36.5 C)  Max: 98.7 F (37.1 C)  Pulse  Avg: 60.6  Min: 37  Max: 76  BP  Min: 73/46  Max: 152/80  No data recorded  Resp  Avg: 15.3  Min: 8  Max: 25  SpO2  Avg: 97.6 %  Min: 90 %  Max: 100 %    Ins & outs:  02/26 0600 - 02/27 0559  In: 813.1 [I.V.:553.1]  Out: 2100 [Urine:2100]    Physical Exam  Constitutional: Cooperative, well developed, well nourished, no acute distress  HEENT: NCAT, EOMI  Neck: supple, JVP flat, no bruits  Cardiac: normal rate, regular rhythm, no murmurs, rubs, nor gallops  Respiratory: clear to auscultation bilaterally, normal work of breathing on room air  Abdomen:  Soft, non tender, bowel sounds present, no rebound or guarding  Skin: No rashes  Extremities: No peripheral edema, WWP, 2+ bilateral peripheral pulses  Neuro: No gross motor or sensory deficits, alert and responding to questions appropriately    Labs:  Recent Labs     02/13/24  0226 02/14/24  0404 02/15/24  0202   NA 135* 142 142   K 4.1 4.0 3.7   CL 98 103 103   BICARB 29 29 31*   BUN 30* 24* 21   CREAT 0.88 0.99 1.06   [place] 9.8 9.9 9.5   MG  --  1.8 2.0  PHOS  --  5.0* 4.2   TP 5.7*  --   --    ALB 2.6*  --   --    TBILI 0.71  --   --    DBILI 0.2  --   --    AST 34  --   --    ALT 29  --   --    ALK 115  --   --        Recent Labs     02/13/24  0226 02/14/24  0404 02/15/24  0200 02/15/24  0202   WBC 6.5 9.9 8.7 8.6   HGB 9.6* 8.2* 8.3* 8.4*   HCT 28.8* 25.3* 26.0* 26.2*   MCV 89.7 92.3 93.5 92.9   PLT 230 309 295 300   SEG 79.9 76.4  --  72.7   LYMPHS 12.5 13.9  --  15.6   MONOS 6.6 8.0  --  8.5   EOS 0.3 0.7  --  2.0       Recent Labs     02/14/24  1308   PTT 29   INR 1.4     Telemetry 02/15/24           Echocardiogram 02/14/24   Summary:   1. The left ventricular size is mildly increased. The left ventricular systolic function is mildly depressed. Left ventricular ejection fraction by Simpson's biplane is 52 %.   2. Multiple segmental abnormalities exist. See findings.   3. The right ventricular size is normal and systolic function is normal.   4. Mild aortic valve regurgitation.   5. Moderately elevated pulmonary artery pressure with right ventricular systolic pressure of 47 mmHg using an estimated right atrial pressure of 8 mmHg.   6. Compared to prior study LVEF is lower and segmental wall motion abnormalities are present in the entire apex and the mid anterior and anteroseptal walls. RVSP is higher (previously 21 mmHg).   7. Primary team was notified of results.     Echocardiogram 08/28/2023  Summary:   1. The left ventricular size is normal. The left ventricular systolic function is normal. Left ventricular  ejection fraction by Simpson's biplane is 59 %.   2. The right ventricular size is normal and systolic function is normal.   3. Normal pulmonary artery pressure with right ventricular systolic pressure of 21 mmHg using an estimated right atrial pressure of 3 mmHg.   4. Dilated aortic root measuring 3.70 cm and 2.03 cm/m index.   5. Dilated ascending aorta measuring 4.10 cm and 2.25 cm/m index.   6. No significant valvular abnormalities.   7. Compared to prior study, no signficant change on side by side comparison.    Assessment and Plan     Stephen [name] is a 73 year old year old male with metastatic urothelial carcinoma, who is admitted to the MICU with hypoxemia, found to have septic shock from possible pulmonary source. Cardiology consulted for elevated troponin and new WMA.     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 In addition, the echocardiogram with antero-apical WMA is suggestive of Takotsubo CM.     This AM developed sinus bradycardia with frequent PVCs, followed by AIVR and polymorphic  NSVT, in the context of worsening shock. Pt denies any symptoms during this, except for sacral pain from wound. On exam is WWP and mentating. 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    # GOC  Per discussion with primary team, who recently spoke with patient's oncologist, overall prognosis is poor without additional cancer directed therapies. They recommend conservative management and may consider palliative care/hospice. Pt is DNAR/DNI currently and would not like cardioversion for  unstable arrhythmias.      Recommendations:  - continue ASA 81  - start hep gtt with bolus for ACS, plan for 48h treatment (end heparin 2/29)  - given bradycardia and NSVT the patient is high risk for malignant ventricular arrhythmias   - consider using vasopressin as primary pressor to avoid further beta agonism in the setting of ventricular arrhythmia   - plan for conservative management in light of GOC    This patient was discussed with Cardiology attending  Dr. Doran Clay who agrees with the above assessment and plan. Discussed recommendations with primary team.    Thank you for this interesting consult. We will follow peripherally, please reach out with any questions or changes in clinical status.    Sharrell Ku, MD, PhD   Cardiovascular Disease Fellow

## 2024-02-15 NOTE — Progress Notes (Signed)
 Pharmacokinetics Note - Vancomycin  Vancomycin Indication: Pneumonia (Goal: Trough 10 - 20 mg/L, AUC 400-600 mg-h/L), started 02/13/2024  Vancomycin level: 22 mg/L on 02/15/2024 at 0202, random, at steady state  Kidney Function: SCr: 1.06 mg/dL, changed from baseline.  Culture results: NGTD    Assessment / Plan:   Pharmacokinetic Parameters: Volume of distribution: 50.2 L, Clearance: 3.7 L/h, Half-life: 9.6 h  Current regimen of 1000mg  IV q12h gives an estimated trough of 14 mg/L and AUC 553 mg-h/L at steady state, which is therapeutic.   Will continue current dose. Monitor renal function and culture results and plan random level on 02/18/2024 or sooner if clinically indicated.    Pharmacist will continue to monitor and make adjustments as needed.    Joylene John, PHARMD

## 2024-02-15 NOTE — Procedures (Signed)
 Stephen [name] is a 73 year old male patient.    ICD-10-CM ICD-9-CM   1. Sepsis, due to unspecified organism, unspecified whether acute organ dysfunction present  A41.9 038.9     995.91   2. Decreased activities of daily living (ADL)  Z78.9 V49.89   3. Dysphagia, oropharyngeal phase  R13.12 787.22     Past Medical History:   Diagnosis Date    Chronic back pain     Congenital hydronephrosis     Gout     Headache     Hematuria     HTN (hypertension) 12/10/2021    Kidney disease     Kidney stones     Major depressive disorder, single episode     Polyarthropathy or polyarthritis of multiple sites     Retinal detachment     Urethral stricture      Blood pressure (!) 91/57, pulse 68, temperature 98.5 F (36.9 C), resp. rate 16, height 5\' 6"  (1.676 m), weight 66.6 kg (146 lb 13.2 oz), SpO2 97%.    PICC Placement      Date/Time: 02/15/2024 11:30 AM      Universal Protocol  Consent: Verbal consent obtained. Written consent obtained.  Risks and benefits: risks, benefits and alternatives were discussed  Consent given by: patient  Patient understanding: patient states understanding of the procedure being performed  Patient consent: the patient's understanding of the procedure matches consent given  Procedure consent: procedure consent matches procedure scheduled  Relevant documents: relevant documents present and verified  Test results: test results available and properly labeled  Site marked: the operative site was marked  Imaging studies: imaging studies available  Patient identity confirmed: verbally with patient, arm band and hospital-assigned identification number  Time out: Immediately prior to procedure a "time out" was called to verify the correct patient, procedure, equipment, support staff and site/side marked as required.        Indications and Staff  Indications: medications/fluids  Insertion location/unit: [facility]  Reason for insertion: new indication  Performed by: Katrine Coho, RN  Attending  present: No  Additional staff members:  Macario Carls, RN          Procedure Details  Anesthesia: local infiltration  Local Anesthetic: lidocaine 1% without epinephrine  Anesthetic total: 5 mL  Patient was not sedated  Skin prep used?: chlorhexidine gluconate  Skin prep dry before first puncture: Yes      Patient position: supine  Insertion side: right  Insertion site: basilic    Catheter type: PICC  Tunneled: No   Catheter lumen: double lumen    Is this line for Dialysis: No  Brand (adult): Bard    Lot #: [id number]    Power rated for IV contrast: Yes  Antimicrobial catheter used: No  Ultrasound guidance: yes  Sterile ultrasound technique: sterile gel and sterile probe covers were used.Indications for ultrasound: safety  Ultrasound guided placement steps: guide wire removed, needle entry and vessel located  Sterile precautions used: sterile gown, cap, mask/eye shield, sterile gloves, head to toe drape on patient and hand hygiene  Sterile field maintained during procedure: Yes  Sterile technique maintained during procedure and dressing application: Yes  Catheter securement: subcutaneous securement    Post Procedure  Post-procedure: dressing applied and antimicrobial patch applied  Estimated Blood Loss: 5 mL  Assessment: blood return through all ports and placement verified by x-ray  Complications: none    Follow up: portable chest xray ordered and  flush protocol by guideline  Number of puncture attempts: 1  Line placement successful: Yes    Catheter tip location: SVC    Final length internal catheter: 36  Final length external catheter (cm): 3  Patient tolerance: patient tolerated the procedure well with no immediate complications      Comments: 36/3 R basilic    All guidewires and stylet were intact, all sharps were accounted for.                        Katrine Coho, RN  02/15/2024

## 2024-02-15 NOTE — Plan of Care (Signed)
 Problem: Promotion of Health and Safety  Goal: Promotion of Health and Safety  Description: The patient remains safe, receives appropriate treatment and achieves optimal outcomes (physically, psychosocially, and spiritually) within the limitations of the disease process by discharge.    Information below is the current care plan.  Outcome: Progressing  Flowsheets  Taken 02/15/2024 1828 by Dagoberto Ligas, RN  Outcome Evaluation (rationale for progressing/not progressing) every shift: Pt A&O4, MAE, Afebrile. On 5L NC, less coughing fits today. Assisted to recliner with FWW, tolerated well. C/O pain to backside and L hip - treated with PRN IV dilaudid with good effect. Suppository given for ongoing constipation - pt had 2 small formed BMs this shift. had brady episode into the 30s while sitting up in recliner followed by profound hypotension and VTACH/idioventricular rythmn. This RN notified team, who immediately came to bedside, EKG obtained, cards reconsulted, and pt moved back to bed and heparin gtt restarted with bolus. Remains on low dose norepinephrine gtt for MAP >65. BC x1 sent. PICC line placed. Trops downtrending.  Taken 02/14/2024 0653 by Vivi Barrack, RN  Individualized Interventions/Recommendations #1: Monitor oxygenation  Individualized Interventions/Recommendations #2 (if applicable): Maintain hemodynamics, MAP > 65  Individualized Interventions/Recommendations #3 (if applicable): Offload bony prominences, maintain skin integrity  Note:

## 2024-02-16 ENCOUNTER — Other Ambulatory Visit: Payer: Self-pay

## 2024-02-16 LAB — CPK-CREATINE PHOSPHOKINASE, BLOOD
CPK: 13 U/L (ref 0–175)
CPK: 15 U/L (ref 0–175)
CPK: 11 U/L (ref 0–175)
CPK: 20 U/L (ref 0–175)

## 2024-02-16 LAB — BASIC METABOLIC PANEL, BLOOD
Anion Gap: 7 mmol/L (ref 7–15)
BUN: 26 mg/dL — ABNORMAL HIGH (ref 8–23)
Bicarbonate: 29 mmol/L (ref 22–29)
Calcium: 9.7 mg/dL (ref 8.5–10.6)
Chloride: 106 mmol/L (ref 98–107)
Creatinine: 1.23 mg/dL — ABNORMAL HIGH (ref 0.67–1.17)
Glucose: 165 mg/dL — ABNORMAL HIGH (ref 70–99)
Potassium: 3.7 mmol/L (ref 3.5–5.1)
Sodium: 142 mmol/L (ref 136–145)
eGFR Based on CKD-EPI 2021 Equation: >60 mL/min/{1.73_m2}

## 2024-02-16 LAB — TROPONIN T GEN 5
Troponin T Gen 5: 166 ng/L (ref <22)
Troponin T Gen 5: 158 ng/L (ref <22)
Troponin T Gen 5: 142 ng/L (ref <22)
Troponin T Gen 5: 131 ng/L (ref <22)
Troponin T Gen 5: 141 ng/L (ref <22)

## 2024-02-16 LAB — CKMB+INDEX (NO CPK), BLOOD
CK-MB Index: 7.7 %
CK-MB Index: 6.7 %
CK-MB Index: 9.1 %
CK-MB Index: 5 %
CK-MB: 1 ng/mL (ref 0–5)
CK-MB: 1 ng/mL (ref 0–5)
CK-MB: 1 ng/mL (ref 0–5)
CK-MB: 1 ng/mL (ref 0–5)

## 2024-02-16 LAB — CBC WITH DIFF, BLOOD
ANC-Automated: 5.4 10*3/uL (ref 1.6–7.0)
Abs Basophils: 0.1 10*3/uL (ref <0.2)
Abs Eosinophils: 0.2 10*3/uL (ref 0.0–0.5)
Abs Lymphs: 1.5 10*3/uL (ref 0.8–3.1)
Abs Monos: 0.6 10*3/uL (ref 0.2–0.8)
Basophils: 0.7 %
Eosinophils: 2.1 %
Hct: 25 % — ABNORMAL LOW (ref 40.0–50.0)
Hgb: 8.1 g/dL — ABNORMAL LOW (ref 13.7–17.5)
Imm Gran %: 0.7 % (ref <1)
Imm Gran Abs: 0.1 10*3/uL — ABNORMAL HIGH (ref <0.1)
Lymphocytes: 19 %
MCH: 30.5 pg (ref 26.0–32.0)
MCHC: 32.4 g/dL (ref 32.0–36.0)
MCV: 94 um3 (ref 79.0–95.0)
MPV: 9 fL — ABNORMAL LOW (ref 9.4–12.4)
Monocytes: 7.7 %
Plt Count: 257 10*3/uL (ref 140–370)
RBC: 2.66 10*6/uL — ABNORMAL LOW (ref 4.60–6.10)
RDW: 14.7 % — ABNORMAL HIGH (ref 12.0–14.0)
Segs: 69.8 %
WBC: 7.7 10*3/uL (ref 4.0–10.0)

## 2024-02-16 LAB — ANTI XA UF (UNFRACTIONATED HEPARIN)
Anti Xa UF (Unfractionated Heparin): 0.12 [IU]/mL
Anti Xa UF (Unfractionated Heparin): 0.13 [IU]/mL
Anti Xa UF (Unfractionated Heparin): 0.17 [IU]/mL

## 2024-02-16 LAB — MAGNESIUM, BLOOD
Magnesium: 2.2 mg/dL (ref 1.6–2.4)
Magnesium: 2.1 mg/dL (ref 1.6–2.4)

## 2024-02-16 LAB — POTASSIUM, BLOOD: Potassium: 3.8 mmol/L (ref 3.5–5.1)

## 2024-02-16 LAB — PHOSPHORUS, BLOOD
Phosphorous: 3.6 mg/dL (ref 2.7–4.5)
Phosphorous: 2.8 mg/dL (ref 2.7–4.5)

## 2024-02-16 LAB — LAB MISC TEST

## 2024-02-16 MED ORDER — HEPARIN IV BOLUS FROM BAG
1600.0000 [IU] | Freq: Once | INTRAMUSCULAR | Status: AC
Start: 2024-02-16 — End: 2024-02-16
  Administered 2024-02-16: 1600 [IU] via INTRAVENOUS

## 2024-02-16 MED ORDER — POTASSIUM CHLORIDE 20 MEQ/50ML IV SOLN
20.0000 meq | Freq: Once | INTRAVENOUS | Status: AC
Start: 2024-02-16 — End: 2024-02-16
  Administered 2024-02-16: 20 meq via INTRAVENOUS
  Filled 2024-02-16: qty 50

## 2024-02-16 MED ORDER — POTASSIUM CHLORIDE 20 MEQ/50ML IV SOLN
20.0000 meq | Freq: Once | INTRAVENOUS | Status: AC
Start: 2024-02-17 — End: 2024-02-17
  Administered 2024-02-17: 20 meq via INTRAVENOUS
  Filled 2024-02-16: qty 50

## 2024-02-16 NOTE — Progress Notes (Signed)
 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 He has been residing at the [facility] and developed a productive cough 1-2 weeks ago, reportedly had CXR done showing pneumonia and was given amoxicillin. He does not know if he finished the course of antibiotics. His cough has since improved and is now an intermittent dry cough. Denies overt dyspnea, chest pain, palpitations. Denies nausea/vomiting. No fevers, chills, rigors. He reports being woken up at his SNF today for low blood pressure but denies syncope, presyncopal symptoms. Patient recently admitted to [facility] 1/14-1/22 for worsening back pain thought to be cancer related pain - this is still ongoing.    Events:  2/26--remains on pressors; troponin increased and echo had WMA; cardiology consulted   2/27--had bradyarrythmia today with NSVT; heparin gtt started; pressors increased    Pertinent Meds:    allopurinol  100 mg Daily    aspirin  81 mg Daily    busPIRone  20 mg BID    Erdafitinib  6 mg Daily    famotidine  20 mg Daily    fluticasone propionate  2 spray Daily    gabapentin  300 mg TID    multivitamin with minerals  1 tablet Daily    piperacillin-tazobactam (ZOSYN) IVPB  4,500 mg Q8H    polyethylene glycol  17 g Daily    senna  17.2 mg HS    sodium chloride  10 mL Q8H    sodium chloride  3 mL Q8H    vancomycin (VANCOCIN) IVPB  1,000 mg Q12H    venlafaxine  50 mg BID        heparin 25,000 units/250 mL   10 mL/hr at 02/16/24 0700 995 Units/hr at 02/16/24 0700    norepinephrine 8 mg in sodium chloride 0.9% 250 mL  0-50 mcg/min 7.5 mL/hr at 02/16/24 0700 4 mcg/min at 02/16/24 0700    sodium chloride           Allergies:    Allergies   Allergen Reactions    Sulfa Drugs Hives, Other and Unspecified     Exam:  Temperature:  [98.1 F (36.7 C)-98.8 F (37.1 C)] 98.8 F (37.1 C) (02/27 2346)  Blood pressure (BP): (73-139)/(39-82) 110/55 (02/28 0706)  Heart Rate:  [47-88] 73 (02/28 0706)  Respirations:  [9-25] 13 (02/28 0706)  Pain Score: Patient Sleeping, Respiratory Assessment Done (02/28 0500)  O2 Device: Nasal cannula (02/28 0600)  O2 Flow Rate (L/min):  [3 l/min-6 l/min] 6 l/min (02/28 0600)  SpO2:  [87 %-100 %] 95 % (02/28 0706)    Intake/Output Summary (Last 24 hours) at 02/16/2024 0758  Last data filed at 02/16/2024 0700  Gross per 24 hour   Intake 1811.93 ml   Output 2725 ml   Net -913.07 ml     GEN: elderly appearing, NAD  CARDS: RRR, brady  RESP: decreased BS bases  ABD: Soft, NT/ND, NABS  MSK/EXT: WWP, trace pitting edema L>R     Pertinent Labs: Reviewed.  Na 142 (02/28) CL 106 (02/28) BUN 26* (02/28) GLU   165* (02/28)   K 3.7 (02/28) CO2  29 (02/28) Cr 1.23* (02/28)      WBC 7.7 (02/28) HGB 8.1* (02/28) PLT 257 (02/28)    HCT 25.0* (02/28)      Micro Data:   Lab Results   Component Value Date    BLOODCULT (A) 01/12/2022     Enterococcus faecalis  Anaerobic Bottle:  For susceptibility results refer to culture #[id number]  Collected date:01/10/22  Collected time:1558  .  Identification performed by [employer]( [facility]). This test  was developed and its performance characteristics determined by [facility]. It has not been cleared or  approved by the U.S. Food and Drug Administration.  The FDA has  determined that such clearance or approval is not necessary.      URINECULTURE (A) 11/28/2023     Enterococcus faecalis  >100,000 CFU/mL  Identification performed by [employer]( [facility]). This test  was developed and its performance characteristics determined by [facility]. It has not been cleared or  approved by the U.S. Food and Drug Administration.   The FDA has  determined that such clearance or approval is not necessary.      URINECULTURE (A) 11/28/2023     Gram-negative bacillus  50,000 - 100,000 CFU/mL  (Mixta intestinalis)  .  Identification performed by [employer]( [facility]). This test  was developed and its performance characteristics determined by [facility]. It has not been cleared or  approved by the U.S. Food and Drug Administration.  The FDA has  determined that such clearance or approval is not necessary.  .  Susceptibility testing performed at:  [facility]  [phone], [email or web address]   [address]   [place], [place] [address]  Evans Lance, MD, PhD, Chief Medical Officer         Results for orders placed or performed in visit on 09/21/23   HIV 1/2 Antibody & P24 Antigen Assay   Result Value Ref Range    HIV 1/2 Antibody & P24 Antigen Assay Non Reactive      *Note: Due to a large number of results and/or encounters for the requested time period, some results have not been displayed. A complete set of results can be found in Results Review.       Pertinent Imaging: Reviewed.  2/25 CXR     Unchanged positioning of the right IJ MediPort with the tip projecting over the mid SVC.     The cardiac silhouette is within normal limits     Interval development of right perihilar ill-defined opacities, given the provided history, concerning for pneumonia. No sizable pleural effusion or pneumothorax.     The visualized osseous structures demonstrate no acute abnormality. Degenerative changes of the spine.    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 Course c/b NSTEMI with evidence of WMA on echo--?stress cardiomyopathy.     (1) shock -- septic +/- cardiac  (2) pneumonia--likely  aspiration     --f/u cultures, cont vanco and zosyn  --cont pressors to keep MAP > 65  --hold on more fluids given hypoxemia and HFrEF    (3) NSTEMI with HFrEF    --cont Aspirin  --cont heparin gtt given concern for ongoing ischemia  --repeat echo once clinically improved and start aldactone and  losartan as per cardiology    (4) metastatic urothelial cancer     --followed by Dr. Roseanne Reno    ICU:  --pepcid  --cont TFs given high aspiration risk     Having ongoing GOC conversations with pt given his metastatic cancer and ongoing pneumonia/NSTEMI.  We are trying to reach his sister as well to have a family meeting this weekend.        This patient is critically ill requiring critical care services due to vital organ system dysfunction that includes the following organ system(s); cardiovascular.    These problems include or stem from  shock .    My critical care of these organ system problems is required to assess, manipulate, and support vital organ function to prevent further life-threatening deterioration of the patient's condition including titration of pressors.     Medical complexity of this patient's current illness is high.    Patient's risk of deterioration and/or death is high.    Total time I spent providing critical care to this patient today exclusive of separately reportable procedures: 40 minutes    Granville Waller, MD

## 2024-02-16 NOTE — Plan of Care (Signed)
 Problem: Promotion of Health and Safety  Goal: Promotion of Health and Safety  Description: The patient remains safe, receives appropriate treatment and achieves optimal outcomes (physically, psychosocially, and spiritually) within the limitations of the disease process by discharge.    Information below is the current care plan.  Outcome: Progressing  Flowsheets  Taken 02/16/2024 1851 by Larinda Buttery, RN  Individualized Interventions/Recommendations #1: Monitor Oxygenation to SpO2 > 92%  Outcome Evaluation (rationale for progressing/not progressing) every shift: Pt is AOx4, moves all extremities, follows commands, afebrile. Reported pain to back and treated with PRN dilaudid x2 and responded well. On 4L NC and goal of O2 sat greater than 92%. NSR with PVCs, bigeminys/trigeminys. Tolerating TF at goal. 1 BM. L Nephrostomy OP good. OOB today for 3 hours in chair with assist. Vasopressor needs increased when turning or getting up to chair. On heparin drip. Potassium replaced x1.  Taken 02/16/2024 0800 by Larinda Buttery, RN  Patient /Family stated Goal: Get into chair  Taken 02/16/2024 0542 by Zorita Pang, RN  Guidelines: Inpatient Nursing Guidelines  Taken 02/14/2024 9147 by Vivi Barrack, RN  Individualized Interventions/Recommendations #2 (if applicable): Maintain hemodynamics, MAP > 65  Individualized Interventions/Recommendations #3 (if applicable): Offload bony prominences, maintain skin integrity  Note:

## 2024-02-16 NOTE — Interdisciplinary (Signed)
 Physical Therapy Evaluation    Admitting Physician:  Granville Flathead, MD  Admission Date 02/13/2024    Inpatient Diagnosis:   Problem List         Codes    Sepsis, due to unspecified organism, unspecified whether acute organ dysfunction present    -  Primary ICD-10-CM: A41.9  ICD-9-CM: 038.9, 995.91    Decreased activities of daily living (ADL)     ICD-10-CM: Z78.9  ICD-9-CM: V49.89    Dysphagia, oropharyngeal phase     ICD-10-CM: R13.12  ICD-9-CM: 787.22            IP Start of Service   Start of Care: 02/16/24  Onset Date: 02/13/2024  Reason for referral: Activity tolerance limitation;Decline in functional ability/mobility    Preferred Language:English         Past Medical History:   Diagnosis Date    Chronic back pain     Congenital hydronephrosis     Gout     Headache     Hematuria     HTN (hypertension) 12/10/2021    Kidney disease     Kidney stones     Major depressive disorder, single episode     Polyarthropathy or polyarthritis of multiple sites     Retinal detachment     Urethral stricture       Past Surgical History:   Procedure Laterality Date    CT INSERTION OF SUPRAPUBIC CATH  09/25/2015    NEPHRECTOMY Right 1955    APPENDECTOMY      COLONOSCOPY      CYSTOSCOPY      CYSTOSCOPY W/ LASER LITHOTRIPSY      OTHER SURGICAL HISTORY      Interstim 01/29/2011    SPINE SURGERY  09/21    Lumbar-sacral fusion    TRANSURETHRAL RESECTION OF PROSTATE          PT Acute       Row Name 02/16/24 1200          Type of Visit    Type of Physical Therapy note Physical Therapy Evaluation       Row Name 02/16/24 1200          Treatment Precautions/Restrictions    Precautions/Restrictions Fall;Multiple lines;+OH     Fall Socks/charm;Bed/chair alarm       Row Name 02/16/24 1200          Medical History    History of presenting condition "Stephen [name] is a 73 year old male w/ hx of 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 Overall appears well on exam with appropriate mentation but having ongoing fluctuation in BP requiring vasopressor support, suspect from sepsis."     Fall history No falls reported in the last 6 months       Row Name 02/16/24 1200          Functional History    Prior Level of Function Minimal deficits     Equipment required for mobility in the home Walker     Other Functional History Information Pt d/c 1/22, was supervision level. Pt reports was IND prior to admission in Jan 2025. Reports has participated minimally in mobility while at SNF.       Row Name 02/16/24 1200          Social History    Living Situation Lives with parent/family  Home Environment House     Home accessibility  Performs activities of daily living (ADL's) on one level     Other Social History Information Pt admitted from SNF. Was previously living in [facility] with sister who works from home       Row Name 02/16/24 1200          Subjective    Subjective Information "I want to walk"     Patient status Patient agreeable to treatment;Nursing in agreement for treatment       Row Name 02/16/24 1200          Pain Assessment    Pain Asssessment Tool Numeric Pain Rating Scale       Row Name 02/16/24 1200          Numeric Pain Rating Scale    Pain Intensity - rating at present 0     Pain Intensity- rating after treatment 0       Row Name 02/16/24 1200          Objective    Overall Cognitive Status Intact - no cognitive limitations or impairments noted     Communication No communication limitations or impairments noted. Current status of hearing, speech and vision allow functional communication.     Coordination/Motor control No limitations or impairments noted. Movement patterns are fluid and coordinated throughout     Balance Balance limitations present     Static Sitting Balance Good - able to maintain balance without handhold support, limited postural sway     Dynamic Sitting Balance Good -  accepts moderate challenge, able to maintain balance while picking object off floor     Static Standing Balance Fair - able to maintain balance with handhold support, may require occasional minimal assistance     Dynamic Standing Balance Fair - accepts minimal challenge, able to maintain balance while turning head/trunk     Extremity Assessment Flexibility, strength, muscle tone and sensation grossly within functional limits throughout     Functional Mobility Functional mobility deficits present     Bed Mobility Minimum assistance (25% assistance)     Bed Mobility Comments MIN A to EOB     Transfers to/from Stand Minimum assistance (25% assistance)     Transfer Comments MIN A FWW STS.     Gait Minimum assistance (25% assistance)     Gait Comments FWW side steps bed>chair     Device used for ambulation/mobility Front wheeled walker     Ambulation Distance ~34ft     Other Objective Findings RN cleared pt for PT. Pt greeted supine in bed, agreeable to therapy. RN present throughout session. +OH following transfers, asymptomatic. RN titrated levo with OOB. Pt grossly MIN A w/ functional mobility. Able to transfer w/ FWW from bed>chair with no complaints of dizziness. Continues to have soft pressures, mobility performed to tolerance. Following short rest break pt able to transfer from chair and perform standing marches x10 w/ FWW. Pt placed back in chair. Hemodynamically stable in chair. Pt comfortable w/ all needs. Encouraged to sit up in chair for 1 hour. RN at bedside.         Vital signs  Supine: 133/80  Sitting: 86/55  Standing: 94/56  Seated in chair: 132/71                          Eval cont.       Row Name 02/16/24 1200  [place] AM-PAC: Basic Mobility    Assistance Needed to Turn from Back to Side While in a Flat Bed Without Using Bedrails 3 - A little (supervised/min assist)     Difficulty with Supine to Sit Transfer 3 - A little (supervised/min assist)     How Much Help Needed to Move to/from Bed to Chair  3 - A little (supervised/min assist)     Difficulty with Sit to Stand Transfer from Chair with Arms 3 - A little (supervised/min assist)     How Much Help Needed to Walk in Room 3 - A little (supervised/min assist)     How Much Help Needed to Climb 3-5 Steps with a Rail 3 - A little (supervised/min assist)     AMPAC Total Score 18     Assessment: AM-PAC Basic Mobility Impairment Rating Score 13-18 - 40-59% impaired       Row Name 02/16/24 1200          Patient/Family Education    Learner(s) Patient     Learner response to rehab patient education interventions Verbalizes understanding;Needs reinforcement       Row Name 02/16/24 1200          Assessment    Assessment Pt presents with deficits in Balance and Activity tolerance affecting functional mobility. Pt is functioning below their reported baseline, currently requires one person assist for safe bed mobility, transfers, gait, household mobility, and community mobility, and will benefit from ongoing skilled PT to maximize functional independence and reach stated functional goals.     Hospital mobility recommendations for nursing/staff: OOB to chair for meals with one person assist    POST ACUTE REHABILITATION DISCHARGE RECOMMENDATIONS:    Recommended Post-Acute Therapy Follow up:   Ongoing Skilled Therapy after Discharge    If available, recommend discharge to a setting with the following assistance for the listed deficits:     Deficits :   Bed mobility: assist level: Minimum (25% or less assistance)  Transfers: assist level: Minimum (25% or less assistance)  Gait: assist level: Minimum (25% or less assistance)      Other Post Acute Discharge Recommendations : n/a    Equipment recommendations:   To be determined as patient progresses in therapy        Rehab Potential Good       Row Name 02/16/24 1200          Patient stated Goal    Patient stated goal To walk more       Row Name 02/16/24 1200          Goal 1 (Short Term)    Impairment Functional mobility limitation      Custom goal Pt will be IND w/ bed mobility     Number of visits 10     Goal Status New       Row Name 02/16/24 1200          Goal 2 (Short Term)    Impairment Functional mobility limitation     Custom goal Pt will be MOD IND w/ transfers using LRAD     Number of visits 10     Goal Status New       Row Name 02/16/24 1200          Goal 3 (Short Term)    Impairment Gait impairment     Custom goal Pt will be SUP w/ ambulating 233ft using LRAD     Number of visits 10  Goal Status New       Row Name 02/16/24 1200          Planned Therapy Interventions and Rationale    Gait Training to normalize gait pattern and improve safety while ambulating with assistive device     Neuromuscular Re-Education to improve safety during dynamic activities     Therapeutic Activities to improve functional mobility and ability to navigate in the home and/or community     Theraputic Exercise to improve activity tolerance to allow greater independence with functional mobility skills       Row Name 02/16/24 1200          Treatment Plan Disussion    Treatment Plan Discussion and Agreement Patient support system determined and all questions were asked and answered;Patient/family/caregiver stated understanding and agreement with the therapy plan       Row Name 02/16/24 1200          Treatment Plan    Continue therapy to address Activity tolerance limitation;Decline in functional ability/mobility     Frequency of treatment 5 times per week     Duration of treatment (number of visits) While patient is hospitalized and in need of skilled therapy services     Status of treatment Patient evaluated and will benefit from ongoing skilled therapy       Row Name 02/16/24 1200          Patient Safety Considerations     Patient safety considerations Patient left sitting at end of treatment;Call light left in reach and fall precautions in place;Patient may be at risk for falls;Nursing notified of safety considerations at end of treatment;Patient left  with all lines/drains secured and intact and RN notified     Patient assistive device requirements for safe ambulation Lupita Shutter Name 02/16/24 1200          Therapy Plan Communication    Therapy Plan Communication Discussed therapy plan with Nursing and/or Physician       Row Name 02/16/24 1200          Physical Therapy Patient Discharge Instructions    Your Physical Therapist suggests the following Continue to follow your prescribed mobility precautions when moving in and out of bed and walking  as instructed;Continue to use correct body mechanics when moving in and out of bed as instructed;Supervision with walking is suggested for increased safety;Continue to use your assistive device as instructed when walking to improve your stability and prevent falls       Row Name 02/16/24 1200          Type of Eval    Moderate Complexity (97162) Completed       Row Name 02/16/24 1200          Therapeutic Procedures    Gait Training (41324) Assistive device training;Patient education        Total TIMED Treatment (min)  15     Therapeutic Activities (97530)  Weight shift activities to improve safety in unsupported sitting or standing;Patient education;Functional activities        Total TIMED Treatment (min)  30       Row Name 02/16/24 1200          Treatment Time     Total TIMED Treatment  (min) 45     Total Treatment Time (min) 60     Treatment start time 1245  The physical therapist of record is endorsed by evaluating physical therapist.

## 2024-02-16 NOTE — Plan of Care (Signed)
 Problem: Promotion of Health and Safety  Goal: Promotion of Health and Safety  Description: The patient remains safe, receives appropriate treatment and achieves optimal outcomes (physically, psychosocially, and spiritually) within the limitations of the disease process by discharge.    Information below is the current care plan.  Outcome: Not Progressing  Flowsheets  Taken 02/16/2024 0542 by Zorita Pang, RN  Guidelines: Inpatient Nursing Guidelines  Taken 02/15/2024 2000 by Zorita Pang, RN  Patient /Family stated Goal: sleep  Taken 02/14/2024 0653 by Vivi Barrack, RN  Individualized Interventions/Recommendations #1: Monitor oxygenation  Individualized Interventions/Recommendations #2 (if applicable): Maintain hemodynamics, MAP > 65  Individualized Interventions/Recommendations #3 (if applicable): Offload bony prominences, maintain skin integrity  Note: A&Ox4. Currently on 6L NC. Not tolerating right sided turns very well d/t hemodynamic instability. Remains on levo and heparin gtts.

## 2024-02-17 LAB — CBC WITH DIFF, BLOOD
ANC-Automated: 4 10*3/uL (ref 1.6–7.0)
Abs Basophils: 0 10*3/uL (ref <0.2)
Abs Eosinophils: 0.2 10*3/uL (ref 0.0–0.5)
Abs Lymphs: 1.2 10*3/uL (ref 0.8–3.1)
Abs Monos: 0.5 10*3/uL (ref 0.2–0.8)
Basophils: 0.5 %
Eosinophils: 3.4 %
Hct: 24.8 % — ABNORMAL LOW (ref 40.0–50.0)
Hgb: 7.8 g/dL — ABNORMAL LOW (ref 13.7–17.5)
Imm Gran %: 0.3 % (ref <1)
Imm Gran Abs: 0 10*3/uL (ref <0.1)
Lymphocytes: 19.7 %
MCH: 29.9 pg (ref 26.0–32.0)
MCHC: 31.5 g/dL — ABNORMAL LOW (ref 32.0–36.0)
MCV: 95 um3 (ref 79.0–95.0)
MPV: 9.1 fL — ABNORMAL LOW (ref 9.4–12.4)
Monocytes: 8.4 %
Plt Count: 253 10*3/uL (ref 140–370)
RBC: 2.61 10*6/uL — ABNORMAL LOW (ref 4.60–6.10)
RDW: 15 % — ABNORMAL HIGH (ref 12.0–14.0)
Segs: 67.7 %
WBC: 5.9 10*3/uL (ref 4.0–10.0)

## 2024-02-17 LAB — RESPIRATORY CULTURE W/GRAM STAIN: Respiratory Culture Result: NORMAL

## 2024-02-17 LAB — BASIC METABOLIC PANEL, BLOOD
Anion Gap: 7 mmol/L (ref 7–15)
BUN: 30 mg/dL — ABNORMAL HIGH (ref 8–23)
Bicarbonate: 28 mmol/L (ref 22–29)
Calcium: 10 mg/dL (ref 8.5–10.6)
Chloride: 114 mmol/L — ABNORMAL HIGH (ref 98–107)
Creatinine: 1.23 mg/dL — ABNORMAL HIGH (ref 0.67–1.17)
Glucose: 175 mg/dL — ABNORMAL HIGH (ref 70–99)
Potassium: 4.1 mmol/L (ref 3.5–5.1)
Sodium: 149 mmol/L — ABNORMAL HIGH (ref 136–145)
eGFR Based on CKD-EPI 2021 Equation: >60 mL/min/{1.73_m2}

## 2024-02-17 LAB — CKMB+INDEX (NO CPK), BLOOD
CK-MB Index: 8.3 %
CK-MB Index: 7.7 %
CK-MB: 1 ng/mL (ref 0–5)
CK-MB: 1 ng/mL (ref 0–5)

## 2024-02-17 LAB — PHOSPHORUS, BLOOD: Phosphorous: 3.4 mg/dL (ref 2.7–4.5)

## 2024-02-17 LAB — MAGNESIUM, BLOOD: Magnesium: 2.2 mg/dL (ref 1.6–2.4)

## 2024-02-17 LAB — ANTI XA UF (UNFRACTIONATED HEPARIN)
Anti Xa UF (Unfractionated Heparin): 0.27 [IU]/mL
Anti Xa UF (Unfractionated Heparin): 0.28 [IU]/mL

## 2024-02-17 LAB — CPK-CREATINE PHOSPHOKINASE, BLOOD
CPK: 12 U/L (ref 0–175)
CPK: 13 U/L (ref 0–175)

## 2024-02-17 LAB — TROPONIN T GEN 5: Troponin T Gen 5: 112 ng/L (ref <22)

## 2024-02-17 MED ORDER — NOREPINEPHRINE-SODIUM CHLORIDE 8-0.9 MG/250ML-% IV SOLN
0.0000 ug/min | INTRAVENOUS | Status: DC
Start: 2024-02-17 — End: 2024-02-19
  Administered 2024-02-17 (×10): 2 ug/min via INTRAVENOUS
  Administered 2024-02-18: 0.5 ug/min via INTRAVENOUS
  Administered 2024-02-18: 2 ug/min via INTRAVENOUS
  Administered 2024-02-18: 0.5 ug/min via INTRAVENOUS
  Administered 2024-02-18 (×2): 2 ug/min via INTRAVENOUS
  Administered 2024-02-18: 0.5 ug/min via INTRAVENOUS
  Administered 2024-02-18: 2 ug/min via INTRAVENOUS
  Administered 2024-02-18: 0.5 ug/min via INTRAVENOUS
  Administered 2024-02-18: 2 ug/min via INTRAVENOUS
  Administered 2024-02-18: 3 ug/min via INTRAVENOUS
  Administered 2024-02-18: 4 ug/min via INTRAVENOUS
  Administered 2024-02-18: 0.5 ug/min via INTRAVENOUS
  Administered 2024-02-18 (×2): 2 ug/min via INTRAVENOUS
  Administered 2024-02-18: 1 ug/min via INTRAVENOUS
  Administered 2024-02-18 (×2): 2 ug/min via INTRAVENOUS
  Administered 2024-02-18: 0.5 ug/min via INTRAVENOUS
  Administered 2024-02-18: 3 ug/min via INTRAVENOUS
  Administered 2024-02-18: 1 ug/min via INTRAVENOUS
  Administered 2024-02-18 (×3): 2 ug/min via INTRAVENOUS
  Administered 2024-02-18: 0.5 ug/min via INTRAVENOUS
  Administered 2024-02-18: 1 ug/min via INTRAVENOUS
  Administered 2024-02-18 (×2): 2 ug/min via INTRAVENOUS
  Administered 2024-02-18: 0.5 ug/min via INTRAVENOUS
  Administered 2024-02-18 (×2): 2 ug/min via INTRAVENOUS
  Administered 2024-02-19 (×2): 1 ug/min via INTRAVENOUS
  Administered 2024-02-19: 0.5 ug/min via INTRAVENOUS
  Administered 2024-02-19: 2 ug/min via INTRAVENOUS
  Administered 2024-02-19: 1.5 ug/min via INTRAVENOUS
  Administered 2024-02-19 (×2): 1 ug/min via INTRAVENOUS
  Administered 2024-02-19: 1.5 ug/min via INTRAVENOUS
  Administered 2024-02-19: 1 ug/min via INTRAVENOUS
  Administered 2024-02-19: 1.5 ug/min via INTRAVENOUS
  Administered 2024-02-19: 1 ug/min via INTRAVENOUS
  Administered 2024-02-19: 2 ug/min via INTRAVENOUS
  Administered 2024-02-19 (×3): 1 ug/min via INTRAVENOUS
  Administered 2024-02-19: 2 ug/min via INTRAVENOUS
  Administered 2024-02-19 (×3): 1 ug/min via INTRAVENOUS
  Administered 2024-02-19: 1.5 ug/min via INTRAVENOUS
  Filled 2024-02-17: qty 250

## 2024-02-17 NOTE — Goals of Care (Addendum)
 Advance Care Planning   GOALS OF CARE / ADVANCE CARE PLANNING CONVERSATION NOTE    A discussion was held with the patient and/or their surrogate decision maker regarding advanced care planning and goals of care.    Persons present:  Patient able to advocate for himself. Sister Colman Cater. PCCM fellow Lynwood Dawley, MD; PCCM Attending Tannen Vandezande Cleda Daub    Patient's Health Care Agent:   First Alternate Health Care Agent: Evert,Pamela-Sister Maricela Curet [phone]    We discussed his overall cancer prognosis. We additionally discussed his overall weakness, frequent aspiration pneumonia, and septic shock. We are concerned we will not be able to break this cycle.     In this context, he would like to prioritize drinking fluids and accepting aspiration risk. We discussed no escalation of care. Specifically no re-upgrade to ICU if downgraded, no escalation of pressors. He was DNR/DNI before this conversations and remains as such.   He would like to continue medical therapy in the hospital to facilitate being discharged to hospice at a facility. He does not want to die in the hospital if at all possible. Given this, will continue low level pressors until able to discharge to hospice at facility.     We did discuss if he has recurrent aspiration pneumonia with hypoxic respiratory failure or worsening shock we would transition to comfort focused care and his time with [name] would be limited.   His sister and patient were able to ask questions and were thankful for the conversation.     Levonne Hubert, MD  Pulmonary/Critical Care Physician

## 2024-02-17 NOTE — Interdisciplinary (Signed)
 Case Management Progress Note:    CM spoke with patient and his sister Rinaldo Cloud at bedside. Both state that they would like patient to DC to SNF with hospice service. Patient states he was at [facility], but does not want to to return back to that SNF.     With patient and Pamela's consent, Hospice referral sent to [facility] hospice and [facility].    CM will continue to follow patient through case management process and provide support.    Aggie Cosier RN  Care Manager

## 2024-02-17 NOTE — Plan of Care (Signed)
 Problem: Promotion of Health and Safety  Goal: Promotion of Health and Safety  Description: The patient remains safe, receives appropriate treatment and achieves optimal outcomes (physically, psychosocially, and spiritually) within the limitations of the disease process by discharge.    Information below is the current care plan.  02/17/2024 1848 by Larinda Buttery, RN  Outcome: Progressing  Flowsheets  Taken 02/17/2024 1846 by Larinda Buttery, RN  Outcome Evaluation (rationale for progressing/not progressing) every shift: Pt is AOx4, moves all extremities, and follows commands. Afebrile. PRN dilaudid x2 for back pain. PRN compazine x1 for nausea. 4L NC for comfort. NSR to SB with frequent ectopy. Remains on low dose levo. Heparin gtt d/c by MD. Adequate UOP. MD spoke with patient and sister at bedside for goals of care and transition to hospice care. See GOC note per MD.  Taken 02/17/2024 0800 by Larinda Buttery, RN  Patient /Family stated Goal: Go home  Taken 02/16/2024 1851 by Larinda Buttery, RN  Individualized Interventions/Recommendations #1: Monitor Oxygenation to SpO2 > 92%  Taken 02/14/2024 0653 by Vivi Barrack, RN  Individualized Interventions/Recommendations #2 (if applicable): Maintain hemodynamics, MAP > 65  Individualized Interventions/Recommendations #3 (if applicable): Offload bony prominences, maintain skin integrity  Note:

## 2024-02-17 NOTE — Plan of Care (Signed)
 Problem: Promotion of Health and Safety  Goal: Promotion of Health and Safety  Description: The patient remains safe, receives appropriate treatment and achieves optimal outcomes (physically, psychosocially, and spiritually) within the limitations of the disease process by discharge.    Information below is the current care plan.  Outcome: Not Progressing  Flowsheets  Taken 02/16/2024 2000 by Zorita Pang, RN  Patient /Family stated Goal: get home  Taken 02/16/2024 1851 by Larinda Buttery, RN  Individualized Interventions/Recommendations #1: Monitor Oxygenation to SpO2 > 92%  Taken 02/16/2024 0542 by Zorita Pang, RN  Guidelines: Inpatient Nursing Guidelines  Taken 02/14/2024 [phone] by Vivi Barrack, RN  Individualized Interventions/Recommendations #2 (if applicable): Maintain hemodynamics, MAP > 65  Individualized Interventions/Recommendations #3 (if applicable): Offload bony prominences, maintain skin integrity  Note: Remains on low dose levo. Heparin gtt infusing and titrated per protocol. Good UO via nephrostomy. PRN dilaudid given x2 w/ good effect. 1 dose of compazine for nausea. 4L NC. Had a brief desat episode but recovered w/in a few minutes. Continues to have copious ectopy.

## 2024-02-18 LAB — BASIC METABOLIC PANEL, BLOOD
Anion Gap: 8 mmol/L (ref 7–15)
BUN: 37 mg/dL — ABNORMAL HIGH (ref 8–23)
Bicarbonate: 30 mmol/L — ABNORMAL HIGH (ref 22–29)
Calcium: 10.4 mg/dL (ref 8.5–10.6)
Chloride: 114 mmol/L — ABNORMAL HIGH (ref 98–107)
Creatinine: 1.28 mg/dL — ABNORMAL HIGH (ref 0.67–1.17)
Glucose: 157 mg/dL — ABNORMAL HIGH (ref 70–99)
Potassium: 4.1 mmol/L (ref 3.5–5.1)
Sodium: 152 mmol/L — ABNORMAL HIGH (ref 136–145)
eGFR Based on CKD-EPI 2021 Equation: >60 mL/min/{1.73_m2}

## 2024-02-18 LAB — BLOOD CULTURE
Blood Culture Result: NO GROWTH
Blood Culture Result: NO GROWTH

## 2024-02-18 MED ORDER — HYDROMORPHONE HCL 1 MG/ML IJ SOLN
1.0000 mg | INTRAMUSCULAR | Status: AC | PRN
Start: 2024-02-18 — End: ?
  Administered 2024-02-18 – 2024-02-22 (×5): 1 mg via INTRAVENOUS
  Filled 2024-02-18 (×5): qty 1

## 2024-02-18 MED ORDER — HYDROMORPHONE HCL 1 MG/ML IJ SOLN
1.0000 mg | Freq: Once | INTRAMUSCULAR | Status: AC
Start: 2024-02-18 — End: 2024-02-18
  Administered 2024-02-18: 1 mg via INTRAVENOUS
  Filled 2024-02-18: qty 1

## 2024-02-18 NOTE — Interdisciplinary (Signed)
 Case Management Progress Note:      CM called hospice agencies to follow up on the hospice referrals.    Per hospice agencies: [facility] and [facility], consents were emailed to patient's  sister to sign. At this time family has not signed consent with either hospice agencies.    CM attempted to reach patient's sister Rinaldo Cloud at listed phone #. No answer, voicemail left with a call back number.    CM will continue to follow patient through case management process and provide support.    Aggie Cosier RN  Care Manager

## 2024-02-18 NOTE — Progress Notes (Signed)
 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     History  He has been residing at the [facility] and developed a productive cough 1-2 weeks ago, reportedly had CXR done showing pneumonia and was given amoxicillin. He does not know if he finished the course of antibiotics. His cough has since improved and is now an intermittent dry cough. Denies overt dyspnea, chest pain, palpitations. Denies nausea/vomiting. No fevers, chills, rigors. He reports being woken up at his SNF today for low blood pressure but denies syncope, presyncopal symptoms. Patient recently admitted to [place] 1/14-1/22 for worsening back pain thought to be cancer related pain - this is still ongoing.     Hospital Course  2/26- Remains on pressors; troponin increased and echo had WMA; cardiology consulted   2/27- Had bradyarrythmia today with NSVT; heparin gtt started; pressors increased    Subjective/Interval Events  Patient still feels well today. Interested in drinking, but understands this is risk for aspiration. Will think about whether he wants to do that or wait until he has hospice facility.    Pertinent Meds: Reviewed.   allopurinol  100 mg Daily    aspirin  81 mg Daily    busPIRone  20 mg BID    famotidine  20 mg Daily    fluticasone propionate  2 spray Daily    gabapentin  300 mg TID    multivitamin with minerals  1 tablet Daily    piperacillin-tazobactam (ZOSYN) IVPB  4,500 mg Q8H    polyethylene glycol  17 g Daily    senna  17.2 mg HS    sodium chloride  10 mL Q8H    sodium chloride  3 mL Q8H    venlafaxine  50 mg BID        norepinephrine 8 mg in sodium chloride 0.9% 250 mL  0-10 mcg/min 0.9 mL/hr at 02/18/24 1200 0.5 mcg/min at  02/18/24 1200    sodium chloride   10 mL/hr at 02/18/24 1200 Rate Verify at 02/18/24 1200       Allergies: Reviewed.  Allergies   Allergen Reactions    Sulfa Drugs Hives, Other and Unspecified       Objective  Vitals: Reviewed.  I/O Last 24: Reviewed.  Pressors: Reviewed.  Levophed: 2 mcg/min    Vent/ECMO: Reviewed.      Physical Exam  GEN: NAD, comfortable in bed  HEENT: NC/AT, MMM, OP without lesions  CARDS: Clear S1/S2, RR, no MRCG  RESP: CTAB, no wheezes/rales  ABD: Soft, NT/ND, NABS  MSK/EXT: WWP, no LE edema  NEURO: Non-focal  PSYCH: Normal affect    Pertinent Labs: Reviewed.  CBC  Lab Results   Component Value Date    WBC 5.9 02/17/2024    RBC 2.61 (L) 02/17/2024    HGB 7.8 (L) 02/17/2024    HCT 24.8 (L) 02/17/2024    MCV 95.0 02/17/2024    MCH 29.9 02/17/2024    MCHC 31.5 (L) 02/17/2024    RDW 15.0 (H) 02/17/2024    MPV 9.1 (L) 02/17/2024    PLT 253 02/17/2024    IANC 3.3 07/27/2023    SEG 67.7 02/17/2024  IMMGRANPC 0.3 02/17/2024    LYMPHS 19.7 02/17/2024    MONOS 8.4 02/17/2024    EOS 3.4 02/17/2024    BASOS 0.5 02/17/2024    ABSNEUTRO 4.0 02/17/2024    ABSLYMPHS 1.2 02/17/2024    ABSMONOS 0.5 02/17/2024    ABSEOS 0.2 02/17/2024    DIFFTYPE Automated 02/17/2024       CMP  Lab Results   Component Value Date    GLU 157 (H) 02/18/2024    BUN 37 (H) 02/18/2024    CREAT 1.28 (H) 02/18/2024    EGFRCKDEPI >60 02/18/2024    NA 152 (H) 02/18/2024    K 4.1 02/18/2024    CL 114 (H) 02/18/2024    BICARB 30 (H) 02/18/2024    ANION 8 02/18/2024    [place] 10.4 02/18/2024    TP 5.7 (L) 02/13/2024    ALB 2.6 (L) 02/13/2024    TBILI 0.71 02/13/2024    AST 34 02/13/2024    ALT 29 02/13/2024    ALK 115 02/13/2024       TSH  Lab Results   Component Value Date    TSH 0.40 11/28/2023       FT4  Lab Results   Component Value Date    FREET4 1.12 09/21/2023         Micro Data: Reviewed.    Prior Pertinent Micro Data: Reviewed.  Pertinent Imaging: Reviewed.    Assessment/Plan    73 year old male w/ hx of metastatic urothelial  carcinoma to R shoulder (on erdafitnib), chronic back pain, 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 Course c/b NSTEMI with evidence of WMA on echo--?stress cardiomyopathy.      # Septic Shock   # Severe, Recurrent Aspiration Pneumonia- Aspiration   -Continue vanco and zosyn  -Will continue Levophed to keep MAP >65 while in hospital, with intention to turn off on arrival at hospice center  -Will order diet, to allow patient to eat if he would like. He is aware this is high risk for aspiration, and that may mean he passes in the hospital.     # NSTEMI with HFrEF  -Cont Aspirin  -Discontinue Heparin     # Metastatic urothelial cancer   Followed by Dr. Roseanne Reno. Patient has no treatment further treatment options.  -Dilaudid PRN for cancer related pain     #Goals of Care  Patient expresses understanding that he has untreatable metastatic urothelial cancer, and is severely week, with worsening, recurrent aspiration. He understands that attempts at eating/drinking are likely to worsen or cause recurrence of pneumonia. For this reason, he wishes to transition to DNR/DNI with no escalation of care, with intention to transition to comfort care once he has picked a hospice facility  -Keep on Vasopressors until able to get to     ICU Checklist  Analgesia/Sedation - Dilaudid PRN  Breathing - NC  Catheters - PICC, NGT  Delirium - None  Early mobility/DVT ppx - Turning off heparin today  Family - Updated sister at bedside  Gut - Pepcid  Glycemia - None  Skin - Turns  Deescalation of antibiotics - Will stop all abx once discharging to hospice    Code Status: Orders Placed This Encounter      Full Care/DNAR (Do not call Code)      Patient was seen and discussed with attending physician, Dr. Levonne Hubert.    Lynwood Dawley, DO  Pulmonary & Critical  Care Fellow  [facility]

## 2024-02-19 LAB — BLOOD CULTURE

## 2024-02-19 MED ORDER — LOPERAMIDE HCL 1 MG/7.5ML PO WRAPPED RECORD
2.0000 mg | Freq: Four times a day (QID) | ORAL | Status: AC | PRN
Start: 2024-02-19 — End: ?
  Administered 2024-02-19 – 2024-02-21 (×2): 2 mg via NASOGASTRIC
  Filled 2024-02-19 (×2): qty 15

## 2024-02-19 MED ORDER — MIDODRINE HCL 5 MG OR TABS
5.0000 mg | ORAL_TABLET | Freq: Three times a day (TID) | ORAL | Status: DC
Start: 2024-02-19 — End: 2024-02-19

## 2024-02-19 MED ORDER — CENTRUM OR LIQD
15.0000 mL | Freq: Every day | ORAL | Status: AC
Start: 2024-02-19 — End: ?
  Administered 2024-02-19 – 2024-02-22 (×4): 15 mL via NASOGASTRIC
  Filled 2024-02-19 (×4): qty 15

## 2024-02-19 MED ORDER — LACTATED RINGERS IV SOLN
Freq: Once | INTRAVENOUS | Status: AC
Start: 2024-02-19 — End: 2024-02-19

## 2024-02-19 MED ORDER — IPRATROPIUM-ALBUTEROL 0.5-2.5 (3) MG/3ML IN SOLN
3.0000 mL | Freq: Four times a day (QID) | RESPIRATORY_TRACT | Status: AC
Start: 2024-02-19 — End: ?
  Administered 2024-02-19 – 2024-02-22 (×10): 3 mL via RESPIRATORY_TRACT
  Filled 2024-02-19 (×11): qty 1

## 2024-02-19 MED ORDER — MIDODRINE HCL 5 MG OR TABS
5.0000 mg | ORAL_TABLET | Freq: Three times a day (TID) | ORAL | Status: AC
Start: 2024-02-19 — End: ?
  Administered 2024-02-19 (×2): 5 mg via NASOGASTRIC
  Filled 2024-02-19 (×2): qty 1

## 2024-02-19 NOTE — Plan of Care (Signed)
 Problem: Promotion of Health and Safety  Goal: Promotion of Health and Safety  Description: The patient remains safe, receives appropriate treatment and achieves optimal outcomes (physically, psychosocially, and spiritually) within the limitations of the disease process by discharge.    Information below is the current care plan.  Outcome: Progressing  Flowsheets  Taken 02/19/2024 1854 by Larinda Buttery, RN  Outcome Evaluation (rationale for progressing/not progressing) every shift: AOx4, moves all extremities, and follows all commands. SB to NSR with ectopy. Afebrile. Levophed gtt dc per MD. PRN dilaudid for pain and PRN compazine for nausea. SpO2 dropping to 87%-90%. Started from 4L NC to now HHFB 45L/80%. Transitioned from TF to PO to NPO d/t desat episodes. Nephrostomy output adequate. Spoke with hospice and social worker about dc to outside facility for hospice.  Taken 02/19/2024 0800 by Sharlee Blew, RN  Patient /Family stated Goal: Discharge to hospice  Taken 02/16/2024 1851 by Larinda Buttery, RN  Individualized Interventions/Recommendations #1: Monitor Oxygenation to SpO2 > 92%  Taken 02/16/2024 0542 by Zorita Pang, RN  Guidelines: Inpatient Nursing Guidelines  Taken 02/14/2024 [phone] by Vivi Barrack, RN  Individualized Interventions/Recommendations #2 (if applicable): Maintain hemodynamics, MAP > 65  Individualized Interventions/Recommendations #3 (if applicable): Offload bony prominences, maintain skin integrity  Note:

## 2024-02-19 NOTE — Progress Notes (Incomplete)
 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     History  He has been residing at the [facility] and developed a productive cough 1-2 weeks ago, reportedly had CXR done showing pneumonia and was given amoxicillin. He does not know if he finished the course of antibiotics. His cough has since improved and is now an intermittent dry cough. Denies overt dyspnea, chest pain, palpitations. Denies nausea/vomiting. No fevers, chills, rigors. He reports being woken up at his SNF today for low blood pressure but denies syncope, presyncopal symptoms. Patient recently admitted to [facility] 1/14-1/22 for worsening back pain thought to be cancer related pain - this is still ongoing.     Hospital Course  2/26- Remains on pressors; troponin increased and echo had WMA; cardiology consulted   2/27- Had bradyarrythmia today with NSVT; heparin gtt started; pressors increased    Subjective/Interval Events  Patient still feels well this morning. Wants to stop tube feeds, but continue drinking     Pertinent Meds: Reviewed.   allopurinol  100 mg Daily    aspirin  81 mg Daily    busPIRone  20 mg BID    famotidine  20 mg Daily    fluticasone propionate  2 spray Daily    gabapentin  300 mg TID    multivitamin with minerals  1 tablet Daily    piperacillin-tazobactam (ZOSYN) IVPB  4,500 mg Q8H    polyethylene glycol  17 g Daily    senna  17.2 mg HS    sodium chloride  10 mL Q8H    sodium chloride  3 mL Q8H    venlafaxine  50 mg BID        norepinephrine 8 mg in sodium chloride 0.9% 250 mL  0-10 mcg/min 1.9 mL/hr at 02/19/24 0600 1 mcg/min at 02/19/24 0600    sodium chloride   10 mL/hr at 02/18/24 1500 Rate Verify at 02/18/24 1500        Allergies: Reviewed.  Allergies   Allergen Reactions    Sulfa Drugs Hives, Other and Unspecified       Objective  Vitals: Reviewed.  I/O Last 24: Reviewed.  Pressors: Reviewed.  Levophed: 2 mcg/min    Vent/ECMO: Reviewed.      Physical Exam  GEN: NAD, comfortable in bed  HEENT: NC/AT, MMM, OP without lesions  CARDS: Clear S1/S2, RR, no MRCG  RESP: CTAB, no wheezes/rales  ABD: Soft, NT/ND, NABS  MSK/EXT: WWP, no LE edema  NEURO: Non-focal  PSYCH: Normal affect    Pertinent Labs: Reviewed.  CBC  Lab Results   Component Value Date    WBC 5.9 02/17/2024    RBC 2.61 (L) 02/17/2024    HGB 7.8 (L) 02/17/2024    HCT 24.8 (L) 02/17/2024    MCV 95.0 02/17/2024    MCH 29.9 02/17/2024    MCHC 31.5 (L) 02/17/2024    RDW 15.0 (H) 02/17/2024    MPV 9.1 (L) 02/17/2024    PLT 253 02/17/2024    IANC 3.3 07/27/2023    SEG 67.7 02/17/2024    IMMGRANPC 0.3 02/17/2024    LYMPHS 19.7 02/17/2024    MONOS 8.4 02/17/2024  EOS 3.4 02/17/2024    BASOS 0.5 02/17/2024    ABSNEUTRO 4.0 02/17/2024    ABSLYMPHS 1.2 02/17/2024    ABSMONOS 0.5 02/17/2024    ABSEOS 0.2 02/17/2024    DIFFTYPE Automated 02/17/2024       CMP  Lab Results   Component Value Date    GLU 157 (H) 02/18/2024    BUN 37 (H) 02/18/2024    CREAT 1.28 (H) 02/18/2024    EGFRCKDEPI >60 02/18/2024    NA 152 (H) 02/18/2024    K 4.1 02/18/2024    CL 114 (H) 02/18/2024    BICARB 30 (H) 02/18/2024    ANION 8 02/18/2024    [place] 10.4 02/18/2024    TP 5.7 (L) 02/13/2024    ALB 2.6 (L) 02/13/2024    TBILI 0.71 02/13/2024    AST 34 02/13/2024    ALT 29 02/13/2024    ALK 115 02/13/2024       TSH  Lab Results   Component Value Date    TSH 0.40 11/28/2023       FT4  Lab Results   Component Value Date    FREET4 1.12 09/21/2023         Micro Data: Reviewed.    Prior Pertinent Micro Data: Reviewed.  Pertinent Imaging: Reviewed.    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 Course c/b NSTEMI with evidence of WMA on echo--?stress cardiomyopathy.      # Septic Shock   # Severe, Recurrent Aspiration Pneumonia- Aspiration   -Continue vanco and zosyn  -Will continue Levophed to keep MAP >65 while in hospital, with intention to turn off on arrival at hospice center  -Will order diet, to allow patient to eat if he would like. He is aware this is high risk for aspiration, and that may mean he passes in the hospital.     # NSTEMI with HFrEF  -Cont Aspirin  -Discontinue Heparin     # Metastatic urothelial cancer   Followed by Dr. Roseanne Reno. Patient has no treatment further treatment options.  -Dilaudid PRN for cancer related pain     #Goals of Care  Patient expresses understanding that he has untreatable metastatic urothelial cancer, and is severely week, with worsening, recurrent aspiration. He understands that attempts at eating/drinking are likely to worsen or cause recurrence of pneumonia. For this reason, he wishes to transition to DNR/DNI with no escalation of care, with intention to transition to comfort care once he has picked a hospice facility  -Keep on Vasopressors until able to get to     ICU Checklist  Analgesia/Sedation - Dilaudid PRN  Breathing - NC  Catheters - PICC, NGT  Delirium - None  Early mobility/DVT ppx - Turning off heparin today  Family - Updated sister at bedside  Gut - Pepcid  Glycemia - None  Skin - Turns  Deescalation of antibiotics - Will stop all abx once discharging to hospice    Code Status: Orders Placed This Encounter      Full Care/DNAR (Do not call Code)      Patient was seen and discussed with attending physician, Dr. Greg Cutter.    Lynwood Dawley, DO  Pulmonary & Critical Care Fellow  [facility]

## 2024-02-19 NOTE — Plan of Care (Signed)
 Problem: Promotion of Health and Safety  Goal: Promotion of Health and Safety  Description: The patient remains safe, receives appropriate treatment and achieves optimal outcomes (physically, psychosocially, and spiritually) within the limitations of the disease process by discharge.    Information below is the current care plan.  Outcome: Progressing  Flowsheets  Taken 02/18/2024 2000 by Meyer Cory, RN  Patient /Family stated Goal: sleep and feel good  Taken 02/16/2024 1851 by Larinda Buttery, RN  Individualized Interventions/Recommendations #1: Monitor Oxygenation to SpO2 > 92%  Taken 02/14/2024 0653 by Vivi Barrack, RN  Individualized Interventions/Recommendations #2 (if applicable): Maintain hemodynamics, MAP > 65  Individualized Interventions/Recommendations #3 (if applicable): Offload bony prominences, maintain skin integrity  Note:    Patient A&Ox4, pleasant. Patient remains on tube feedings with a PO diet order for comfort feeds. Patient with requests for ginger ale and water during shift, both given. Patient remains with productive, wet cough both with and without intake of fluids. Fluids given in small increments with patient sitting upright to help minimize aspiration risk. Patient understand and receptive to these methods. Intermittent complaints of pain throughout shift treated with PRN medication orders. Remains on low dose levophed with goal of getting patient to hospice facility. Patient abel to rest some throughout shift, turned q2hr for skin integrity and comfort.

## 2024-02-19 NOTE — Progress Notes (Signed)
 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), chronic back pain, recurrent cystitis w/ hematuria (s/p L nephrostomy tube), gout, HTN     Interval Events  No acute overnight events      Physical Examination:  Temperature:  [97.2 F (36.2 C)-98.2 F (36.8 C)] 98.2 F (36.8 C) (03/03 0600)  Blood pressure (BP): (73-147)/(42-109) 109/42 (03/03 0700)  Heart Rate:  [44-91] 60 (03/03 0700)  Respirations:  [8-24] 13 (03/03 0700)  Pain Score: Patient Sleeping, Respiratory Assessment Done (03/03 0451)  O2 Device: Nasal cannula (03/03 0700)  O2 Flow Rate (L/min):  [4 l/min-6 l/min] 4 l/min (03/03 0700)  SpO2:  [94 %-100 %] 99 % (03/03 0700)           Intake/Output Summary (Last 24 hours) at 02/19/2024 0733  Last data filed at 02/19/2024 0636  Gross per 24 hour   Intake 2574.02 ml   Output 2275 ml   Net 299.02 ml         GEN: Well developed, cachectic, in no acute distress  EYES: eomi, per, anicteric  ENT: dry mucous membranes, no discharge, hearing intact to conversation  RESP: no increased work of breathing and good aeration  CV: regular rate, regular rhythm, and no murmurs, no extra heart sounds  ABD: non-distended, no tenderness to palpation  EXTREM: no clubbing and no edema  NEURO: cnii-xii grossly intact, grossly normal sensation/strength/coordination  SKIN: no rashes or bruises on exposed skin        Labs:  reviewed; Chem panel with, hypernatremia, metabolic alkalosis, AKI, CBC with, anemia, and elevated cardiac enzymes    Imaging:  reviewed; no new imaging    Diagnostics:  reviewed;         Assessment and Plan:  Stephen [name] is critically ill and admitted to the ICU with     Problems:  . Septic shock - due to pneumonia  . Pneumonia - due to aspiration  . Metastatic urothelial carcinoma - on erdafitnib without curative options  . NSTEMI - elevated  troponins, new wall motion abnormalities, completed 72hrs of heparin drip  .     Plans:  - judicious use of fluids and vasopressors to maintain MAP > 65  - continue abx coverage with zosyn  - supplemental O2 to maintain SpO2 > 93%  - goal fluid balance natural, provide small bolus today for GI losses  - the following services were consulted for interventions/recommendations: Cardiology and signed off   - will attempt conversion to oral midodrine to facilitate transfer for out of hospital hospice care per patient wishes     Agitation/Analgesia: hydromorphone  Breathing: as above  Catheters: as below  Delirium: monitor  DVT/Mobilizaiton: heparin-100 Units,heparin-100 Units   Endo/Glucose: BG goal 140-180   Gut: diet - patient directed, tube feeds holding due to diarrhea  Skin: per nursing   Family: updated  Code Status: DNAR - Full Care    Patient Lines/Drains/Airways Status       Active PICC Line / CVC Line / PIV Line / Drain / Airway / Intraosseous Line / Epidural Line / ART Line / Line Type / Wound / Pressure Ulcer Injury       Name Placement date Placement time Site Days    PICC Double Lumen - 02/15/24 Right Basilic 02/15/24  1130  Basilic  3    Port A Cath Single Lumen  - 02/13/24 Subclavian 02/13/24  0221  Subclavian  6    Nephrostomy -  Left 11/29/23  1151  Left  81    Stent -  Ureteral (left) 12/24/21  1102  Ureteral (left)  786    Pressure Ulcer/ Injury -  Stage I (non-blanchable reddness over pressure points) Coccyx 02/13/24  --  -- 6                     allopurinol  100 mg Daily    aspirin  81 mg Daily    busPIRone  20 mg BID    famotidine  20 mg Daily    fluticasone propionate  2 spray Daily    gabapentin  300 mg TID    multivitamin with minerals  1 tablet Daily    piperacillin-tazobactam (ZOSYN) IVPB  4,500 mg Q8H    polyethylene glycol  17 g Daily    senna  17.2 mg HS    sodium chloride  10 mL Q8H    sodium chloride  3 mL Q8H    venlafaxine  50 mg BID      norepinephrine 8 mg in sodium chloride 0.9% 250  mL  0-10 mcg/min 1.9 mL/hr at 02/19/24 0600 1 mcg/min at 02/19/24 0600    sodium chloride   10 mL/hr at 02/18/24 1500 Rate Verify at 02/18/24 1500      artificial tears  2 drop Q12H PRN    atropine  0.5 mg Once PRN    bisacodyl  10 mg Q8H PRN    diphenhydrAMINE  25 mg Q4H PRN    heparin  100 Units Once PRN    heparin  100 Units Once PRN    HYDROmorphone  1 mg Q4H PRN    HYDROmorphone  1 mg Q4H PRN    nalOXone  0.1 mg Q2 Min PRN    prochlorperazine  10 mg Q6H PRN    prochlorperazine  10 mg Q6H PRN    sodium chloride  10 mL PRN    sodium chloride  3 mL PRN    sodium chloride   Continuous PRN    tizanidine  2 mg Q8H PRN    traZODone  50 mg Nightly PRN             Merlene Laughter, MD, MA      This patient is critically ill due to vital organ failure including: septic shock.  The patient has a high probability of imminent deterioration due to the above and requires critical care management by me, including high complexity medical decision making and the assessment, manipulation and support of vital organ functions in an attempt to prevent further clinical decline. This included: titration of inotropes/vasopressors.     I personally spent 45 minutes of critical care time evaluating, managing, providing care and documenting critical care activities exclusively for this patient, to which I was immediately available. This was in relation to the organ systems outlined above, and did not involve time spent performing separately reportable (non-bundled) procedures.

## 2024-02-19 NOTE — Interdisciplinary (Addendum)
 CM notified that pt dc plan is going to SNF with hospice following. Pt has chosen [facility]. CM notified [facility] hospice liaison that pt has chosen them. Morrie Sheldon will f/u with pt and get consents signed. She will also reach out to SNFs that are contracted with [facility] hospice for open beds to accept pt today.   Karleen Hampshire, RN, CM    Update - CM notified that pt is no IV vasopressor, and SNF is unable to accept pt on vasopressor IV. Pt MD they will plan to transition pt to PO Midodrine. Pt can then transfer to SNF and be signed on to hospice. CM will continue to follow.   Karleen Hampshire, RN, CM

## 2024-02-19 NOTE — Interdisciplinary (Signed)
 Physical Therapy Daily Treatment Note    Admitting Physician:  Merlene Laughter, MD  Admission Date 02/13/2024    Inpatient Diagnosis:   Problem List         Codes    Sepsis, due to unspecified organism, unspecified whether acute organ dysfunction present    -  Primary ICD-10-CM: A41.9  ICD-9-CM: 038.9, 995.91    Decreased activities of daily living (ADL)     ICD-10-CM: Z78.9  ICD-9-CM: V49.89    Dysphagia, oropharyngeal phase     ICD-10-CM: R13.12  ICD-9-CM: 787.22    Difficulty in walking     ICD-10-CM: R26.2  ICD-9-CM: 719.7            IP Start of Service   Start of Care: 02/16/24  Onset Date: 02/13/2024  Reason for referral: Activity tolerance limitation;Decline in functional ability/mobility    Preferred Language:English         Past Medical History:   Diagnosis Date    Chronic back pain     Congenital hydronephrosis     Gout     Headache     Hematuria     HTN (hypertension) 12/10/2021    Kidney disease     Kidney stones     Major depressive disorder, single episode     Polyarthropathy or polyarthritis of multiple sites     Retinal detachment     Urethral stricture       Past Surgical History:   Procedure Laterality Date    CT INSERTION OF SUPRAPUBIC CATH  09/25/2015    NEPHRECTOMY Right 1955    APPENDECTOMY      COLONOSCOPY      CYSTOSCOPY      CYSTOSCOPY W/ LASER LITHOTRIPSY      OTHER SURGICAL HISTORY      Interstim 01/29/2011    SPINE SURGERY  09/21    Lumbar-sacral fusion    TRANSURETHRAL RESECTION OF PROSTATE          PT Acute       Row Name 02/19/24 1400          Type of Visit    Type of Physical Therapy note Physical Therapy Daily Treatment Note       Row Name 02/19/24 1400          Treatment Precautions/Restrictions    Precautions/Restrictions Fall;Multiple lines     Fall Socks/charm     Other Precautions/Restrictions Information 4L/min supp O2 via NC, NGT       Row Name 02/19/24 1400          Subjective    Subjective Information Pt received in bed, agreeable to PT. Pt requested assistance to get off  bedpan. Pt reported feeling slightly lightheaded in standing, improved in sitting     Patient status Patient agreeable to treatment;Nursing in agreement for treatment       Row Name 02/19/24 1400          Pain Assessment    Pain Asssessment Tool Numeric Pain Rating Scale       Row Name 02/19/24 1400          Numeric Pain Rating Scale    Pain Intensity - rating at present 4     Pain Intensity- rating after treatment 4      Back, R shoulder       Row Name 02/19/24 1400          Objective    Overall Cognitive Status At baseline level  Coordination/Motor control Other (comment)     Coordination/Motor Control  Information R handed. Pt able to perform alternating finger opposition and finger to nose coordination B UEs with slight increased time and tremor, accurate to target     Balance Balance limitations present     Static Sitting Balance Good - able to maintain balance without handhold support, limited postural sway     Dynamic Sitting Balance Good - accepts moderate challenge, able to maintain balance while picking object off floor     Static Standing Balance Fair - able to maintain balance with handhold support, may require occasional minimal assistance     Dynamic Standing Balance Fair - accepts minimal challenge, able to maintain balance while turning head/trunk     Other Balance Information standing balance w/FWW     Extremity Assessment Range of motion, strength,  muscle tone and/or sensation limitations present     LUE findings WFL, grossly 3+/5     RUE findings WFL, grossly 4/5     LLE findings hip flex 3+/5, quad 4/5, hamstring 4/5, ankle DF/PF 4/5     RLE findings hip flex 3+/5, quad 4/5, hamstring 4/5, ankle DF/PF 4/5     Other  Extremity Assessment  Information subjective intact sensation B UEs and LEs to touch, pt reported decreased sensation plantar surface of B feet     Functional Mobility Functional mobility deficits present     Bed Mobility Minimum assistance (25% assistance)     Bed Mobility  Comments Pt able to perform log roll bed mob with HOB 40 deg and bedrail, MinA for sequencing and press up     Transfers to/from Stand Minimum assistance (25% assistance)     Transfer Comments Pt able to perform STS w/FWW and MinA, cues for setup/sequencing and assist w/lift initiation     Gait Minimum assistance (25% assistance)     Gait Comments Pt able to walk in room w/FWW, demonstrating very decreased step length, height, and gait velocity. Distance limited by fatigue and some lightheadedness, MinA required for steadying and FWW management     Device used for ambulation/mobility Front wheeled walker     Ambulation Distance 29ftx2  standing break in between     Other Objective Findings Pt educated in pacing with positional changes and activity, benefits of mobility and safety recommendations. Pt educated in UE and LE AROM as therex. Pt able to perform transfer and gait training per above, educated in +orthostatic hypotension and importance of having assistance for all mobility. Pt returned to chair w/needs in reach. Pt educated in sitting LE HEP and provided with handout of education. Pt reported plan for hospice, and that his sister is helping him to tour locations.                    PT Acute Tool Box       Row Name 02/19/24 1400          Cardiopulmonary    Cardiopulmonary Cardiopulmonary: Vitals     Cardiopulmonary Comments supine BP 120/73 MAP 88 HR 61, sitting BP 105/59 MAP 75 HR 55 SPO2 98%, standing BP 99/85 MAP 90 HR 69                        Eval cont.       Row Name 02/19/24 1400          Patient/Family Education    Learner(s) Patient     Learner response  to rehab patient education interventions Verbalizes understanding;Needs reinforcement;Education materials provided     Patient/family training comments sitting LE HEP handout       Row Name 02/19/24 1400          Assessment    Assessment Pt is currently demonstrating decreased activity tolerance and limited by + orthostatic hypotension. While  hospitalized, pt would benefit from IPPT to improve safety with functional mobility, dymamic balance and gait endurance. Recommend daily mobility with nursing staff, and transfer to chair/sitting HEP to prevent deconditioning. Pt reported plan for hospice and sister to assist him in touring location.    POST ACUTE REHABILITATION DISCHARGE RECOMMENDATIONS:    Recommended Post-Acute Therapy Follow up:   Ongoing Skilled Therapy after Discharge    If available, recommend discharge to a setting with the following assistance for the listed deficits:     Deficits :   Bed mobility: assist level: Minimum (25% or less assistance)  Transfers: assist level: Minimum (25% or less assistance)  Gait: assist level: Minimum (25% or less assistance)      Other Post Acute Discharge Recommendations : assist for all mobility, continue O2 titration, sitting LE HEP    Equipment recommendations:   To be determined as patient progresses in therapy          Rehab Potential Good       Row Name 02/19/24 1400          Patient stated Goal    Patient stated goal to get OOB to chair       Row Name 02/19/24 1400          Planned Therapy Interventions and Rationale    Gait Training to normalize gait pattern and improve safety while ambulating with assistive device     Neuromuscular Re-Education to improve safety during dynamic activities;to improve kinesthetic awareness and postural control     Therapeutic Activities to improve functional mobility and ability to navigate in the home and/or community;to improve transfers between surfaces;to restore functional performace using graded activities     Theraputic Exercise to increase strength to allow greater independence with functional mobility skills;to improve activity tolerance to allow greater independence with functional mobility skills       Row Name 02/19/24 1400          Treatment Plan Disussion    Treatment Plan Discussion and Agreement Patient/family/caregiver stated understanding and agreement  with the therapy plan       Row Name 02/19/24 1400          Treatment Plan    Continue therapy to address Decline in functional ability/mobility     Frequency of treatment 3 times per week     Duration of treatment (number of visits) While patient is hospitalized and in need of skilled therapy services     Status of treatment Patient evaluated and will benefit from ongoing skilled therapy       Row Name 02/19/24 1400          Patient Safety Considerations     Patient safety considerations Patient left sitting at end of treatment;Call light left in reach and fall precautions in place;Patient may be at risk for falls;Patient left with all lines/drains secured and intact and RN notified;Nursing notified of safety considerations at end of treatment     Patient assistive device requirements for safe ambulation Walker;Wheelchair       Row Name 02/19/24 1400          Therapy Plan Communication  Therapy Plan Communication Discussed therapy plan with Nursing and/or Physician       Row Name 02/19/24 1400          Physical Therapy Patient Discharge Instructions    Your Physical Therapist suggests the following Continue to complete your home exercise program daily as instructed;Supervision with walking is suggested for increased safety;Continue to use your assistive device as instructed when walking to improve your stability and prevent falls;Continue to use your wheelchair for mobility until your Physician has indicated that you may progress with your activity level       Row Name 02/19/24 1400          Therapeutic Procedures    Gait Training (16109) Assistive device training;Patient education;Postural alighnment/biomechanic training during gait;Weight shift and postural control activities during gait;Gait pattern analysis and treatment of deviations        Total TIMED Treatment (min)  15     Neuromuscular re-education (97112)  Balance activities to improve control of center of gravity over base of support;Balance reaches -  upper extremity;Coordination activities;Detection of limits of stability and body position in space;Facilitation of protective responses and balance reactions;Patient education;Static balance training;Trunk alighment/stability activities        Total TIMED Treatment (min)  15     Therapeutic Activities (97530)  Weight shift activities to improve safety in unsupported sitting or standing;Patient education;Functional activities;Assistance/facilitation of bed mobility;Facillitation of safety awareness/responses during functional tasks;Progressive mobilization to improve functional independence;Transfer training with weight shift and direction change        Total TIMED Treatment (min)  20     Therapeutic exercise  (60454)  Assisted exercises with tactile cues/facilitation to increase muscle recruitment;Home Exercise Program (HEP) demonstration and performance;Patient education;Range of motion exercises;Strengthening exercises        Total TIMED Treatment (min)  10       Row Name 02/19/24 1400          Treatment Time     Total TIMED Treatment  (min) 60     Total Treatment Time (min) 60     Treatment start time 0945                        The physical therapist of record is endorsed by evaluating physical therapist.

## 2024-02-20 MED ORDER — NOREPINEPHRINE-SODIUM CHLORIDE 8-0.9 MG/250ML-% IV SOLN
0.0000 ug/min | INTRAVENOUS | Status: DC
Start: 2024-02-20 — End: 2024-02-22
  Administered 2024-02-20: 1 ug/min via INTRAVENOUS
  Administered 2024-02-20 (×3): 2 ug/min via INTRAVENOUS
  Administered 2024-02-20 (×2): 1 ug/min via INTRAVENOUS
  Administered 2024-02-20 (×3): 2 ug/min via INTRAVENOUS
  Administered 2024-02-20: 1 ug/min via INTRAVENOUS
  Administered 2024-02-20 (×3): 2 ug/min via INTRAVENOUS
  Administered 2024-02-20: 1 ug/min via INTRAVENOUS
  Administered 2024-02-20 (×2): 2 ug/min via INTRAVENOUS
  Administered 2024-02-20: 1.5 ug/min via INTRAVENOUS
  Administered 2024-02-20 (×4): 2 ug/min via INTRAVENOUS
  Administered 2024-02-20: 3 ug/min via INTRAVENOUS
  Administered 2024-02-20: 2 ug/min via INTRAVENOUS
  Administered 2024-02-21: 1 ug/min via INTRAVENOUS
  Administered 2024-02-21 (×7): 2 ug/min via INTRAVENOUS
  Administered 2024-02-21 (×2): 1 ug/min via INTRAVENOUS
  Administered 2024-02-21: 4 ug/min via INTRAVENOUS
  Administered 2024-02-21 (×2): 2 ug/min via INTRAVENOUS
  Administered 2024-02-21: 1 ug/min via INTRAVENOUS
  Administered 2024-02-21: 2 ug/min via INTRAVENOUS

## 2024-02-20 MED ORDER — MIDODRINE HCL 5 MG OR TABS
15.0000 mg | ORAL_TABLET | Freq: Three times a day (TID) | ORAL | Status: DC
Start: 2024-02-20 — End: 2024-02-22
  Administered 2024-02-20 – 2024-02-22 (×8): 15 mg via NASOGASTRIC
  Filled 2024-02-20 (×8): qty 1

## 2024-02-20 MED ORDER — LACTATED RINGERS IV SOLN
Freq: Once | INTRAVENOUS | Status: AC
Start: 2024-02-20 — End: 2024-02-20

## 2024-02-20 MED ORDER — MIDODRINE HCL 10 MG OR TABS
10.0000 mg | ORAL_TABLET | Freq: Three times a day (TID) | ORAL | Status: DC
Start: 2024-02-20 — End: 2024-02-20

## 2024-02-20 NOTE — Interdisciplinary (Addendum)
 OTContact       Row Name 02/20/24 9057       Therapy Contact Note    Therapy not provided at this time as Patient/family/caregiver declined treatment.    Additional Comments pt declined OT am treatment stating he had just gotten comfortable, agreeable for OT to reattempt at a later time.    OT returned at 1400, per RN pt received rescue dose for pain management has just fallen asleep after being in pain, will continue to follow up as pt condition and schedule permit.

## 2024-02-20 NOTE — Interdisciplinary (Signed)
 SLP Contact       Row Name 02/20/24 0815       Therapy Contact  Note    Contact Time 0815    Speech Therapy Screening Assessment Other (comment)    Additional Comments Clarified w/team re: diet status. Pt was advanced to Regular diet for oral gratification awaiting hospice per team. Team requesting to clear orders & d/c SLP services at this time.                Please contact SLP on-call pager with questions or concerns:  [place] [phone]  [place] [phone]

## 2024-02-20 NOTE — Interdisciplinary (Addendum)
 Nutrition Note      Evaluation Type: Follow-up    Recommendations:    -Patient meets criteria for Severe Malnutrition  1- Should warranted per GOC and pt agrees to restart EN, rec to start     Continuous Tube Feeding Recommendation     Isosource 1.5 goal @ 60 mL/hr x 24 hr/day.   Start at 15 mL/hr and increase by 15 mL every 4 hrs to goal rate.  Other Recommended Tube Feeding Regimen: n/a  Provides Daily: 2160 kcals, 98 g protein, 253.44 g carbs, 1094 mL free water , 1440 mL total volume.     Suggested Free Water  Flush:  per MD        2- Rec stool studies to r/o GI infxn, should results are -ve and D not improving, rec to start banatrol plus TID  3- Antimotility regimen pending GI w/u and D trends with bulking agents  4- Rec daily wt to trend                                                                                                                                         A: Per MD:   73 year old male Admitted 02/13/2024 for pneumonia. 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 On HHFB 40 L/min from NC  4 L/min on 3/3.   Team will attempt conversion to oral midodrine  to facilitate transfer for out of hospital hospice care per patient wishes        Nutrition Summary   Current Nutrition Regimen: NPO today, piror on Peptamen  1.5 w/ prebio at 60 ml/hr x 24 hrs+1 prosrouce  Source of Information: Chart Review;Spoke with Nurse;Spoke with Pharmacist;Patient not appropriate for interview (RT working closely with pt)  Barriers to Intake: Frequent TF infusion interruptions  Additional Comments: GI: abd: NTTP, ND per MD note. Noted pt placed on regular diet 3/2, TF Rx changed from Nutren 1.5 @30  mL/hr, to Peptamen  1.5 with prebio at 60 mL/hr x 24 hrs on 3/2 then NPO today. TF held 2/2 D per MD note,  1-3bm  since  2/28. No stool studies noted. RN not really sure about holding TF. s/w MD, might not restart TF now as TF is held per pt request.  Adequacy of Nutrition Intake: Meeting 50-75% of estimated needs (Avg intake x 5 days: 954 ml TF +0.6 prosource: 1479 kcal (74% low end needs), 77g protein (77% low end previous needs))    Tray Items Taken for the past 168 hrs:   Number of Items Taken Number of Items on Tray Diet Tolerance   02/19/24 0800 2 2 s/s of dysphagia     Supplement Intake for the past 168 hrs:  Diet Supplements Supplement  - Intake (mL) Supplement Tolerance   02/16/24 0537 Prosource 60 mL --   02/17/24 1400 Prosource 60 mL NPO   02/18/24 0550 Prosource 60 mL --       Anthropometrics   Height - Most Recent Measurement   02/13/24 5' 6 (1.676 m)       Weight For Nutrition Equations: 66.6 kg (146 lb 13.2 oz)     BMI for Nutrition Calculations: 23.7  Ideal Body Weight (kg): 64.36  Percent of Ideal Body Weight: 103.48 %  Usual Body Weight (Dietary): 73.5 kg (162 lb 0.6 oz) (Sept 2024 per RD note 1/17)  Change from UBW (%): -9.39 %  Time Frame of Weight Change From UBW: 3-6 months (x almost 6 mo, was at 145 lbs on 1/17 per RD note 1/17)    Weights (last 14 days)       Date/Time Weight Weight Source Percentage Weight Change (%) Who    02/14/24 0000 66.6 kg (146 lb 13.2 oz) Bed scale -1.33 % EA    02/13/24 0214 67.5 kg (148 lb 13 oz) Bed scale 0 % JG             Estimated Needs  Calories: 1998 kcal/day - 2331 kcal/day (30 kcal/kg/day - 35 kcal/kg/day x 66.6 kg (146 lb 13.2 oz))  Protein: 80 g/day - 133 g/day (1.2 g/kg/day - 2 g/kg/day x 66.6 kg (146 lb 13.2 oz))  Fluids: 1665 mL/day - 1998 mL/day (25 mL/kg/day - 30 mL/kg/day x 66.6 kg (146 lb 13.2 oz)) or per MD    Elinda Oliphant Equation: 1373    Nutrition Focused Physical Exam   Body Fat Loss    Orbital (!) Moderate (02/15/24 1657)   Upper Arm (!) Mild (02/15/24 1657)   Thoracic/Lumbar Within Defined Limits (02/15/24 1657)   Muscle Mass Loss    Temple (!)  Severe (02/15/24 1657)   Clavicle Bone Region (!) Severe (02/15/24 1657)   Deltoid Within Defined Limits (02/15/24 1657)   Scapula Bone Region Within Defined Limits (02/15/24 1657)   Interosseous Within Defined Limits (02/15/24 1657)   Anterior Thigh (!) Moderate (02/15/24 1657)   Patellar Region (!) Moderate (02/15/24 1657)   Posterior Calf (!) Moderate (02/15/24 1657)   Micronutrient Deficiency       Edema:    Edema  RL Extremity: +1;Non-pitting  LL Extremity: +1;Non-pitting    Malnutrition Diagnostic Criteria - Chronic Illness  Malnutrition Designation: Severe  Energy Intake: <75% for greater than or equal to 1 Month  Weight Loss: 10% in 6 Months (min  9.4% x 6 mo)  Fluid Accumulation: Not Applicable  Body Fat Loss: Moderate  Muscle Mass Loss: Moderate  Chronic Dx Status: Revised    Clinical Considerations:   Allergies: Sulfa drugs  IV Access - Peripheral:     IV Access - Central  Port A Cath Single Lumen  - 02/13/24 Subclavian (Active)       PICC Double Lumen - 02/15/24 Right Basilic (Active)     Tubes and Drains:  Nephrostomy -  Left (Active)        GI:  Stool Assessment for the past 168 hrs:   Stool Amount Stool Occurrence Stool Color Stool Appearance Stool Source   02/14/24 0400 Smear 1 Brown Partially liquid Incontinent   02/15/24 0600 Smear 1 Brown -- --   02/15/24 2000 Medium 1 Brown Soft formed Incontinent   02/16/24 0639 Medium 1 Brown Soft formed Incontinent   02/16/24 0705 Large 1  Brown Entirely liquid Incontinent   02/16/24 1700 Large 1 Black Entirely liquid Other (Comment)   02/17/24 0800 Large 1 Brown Entirely liquid Other (Comment)   02/18/24 0630 Large 1 Brown Entirely liquid Incontinent   02/18/24 2200 Large 2 Brown Entirely liquid Incontinent   02/19/24 0800 Large 1 Brown Entirely liquid Incontinent   02/20/24 0600 Large 1 Brown Entirely liquid Incontinent   02/20/24 0800 -- 0 -- -- --   02/20/24 1100 Large 1 Brown Entirely liquid Incontinent   02/20/24 1200 -- 0 -- -- --       Skin  Integrity:  Skin Integrity (WDL): Exceptions to WDL       Wounds/Incisions:       Pressure Injuries:  Pressure Ulcer/ Injury -  Stage I (non-blanchable reddness over pressure points) Coccyx (Active)       Labs: GFR >60  Recent Labs     02/18/24  0527   NA 152*   K 4.1   CL 114*   BICARB 30*   BUN 37*   CREAT 1.28*   GLU 157*   CA 10.4       Lab Results   Component Value Date    CHOL 130 09/21/2023    TRIG 125 09/21/2023       No results found for: A1C    No results for input(s): GLUCPOCT in the last 72 hours.    Lab Results   Component Value Date    VITAMIND25HY 77 03/23/2023       Medication Review Comments: Levo 2 mcg. lowest MAPs at 69 since noon  IV:    norepinephrine  8 mg in sodium chloride  0.9% 250 mL  0-50 mcg/min 3.8 mL/hr at 02/20/24 1400 2 mcg/min at 02/20/24 1400    sodium chloride    10 mL/hr at 02/18/24 1500 Rate Verify at 02/18/24 1500     Scheduled:    allopurinol   100 mg Daily    aspirin   81 mg Daily    busPIRone   20 mg BID    famotidine   20 mg Daily    fluticasone  propionate  2 spray Daily    gabapentin   300 mg TID    ipratropium-albuterol   3 mL 4x Daily    midodrine   15 mg TID    multivitamin with iron-minerals  15 mL Daily    piperacillin -tazobactam (ZOSYN ) IVPB  4,500 mg Q8H    polyethylene glycol  17 g Daily    senna  17.2 mg HS    sodium chloride   10 mL Q8H    sodium chloride   3 mL Q8H    venlafaxine   50 mg BID       Discharge: pending clinical course  Education: when clinically appropriate    RD to monitor/evaluate: labs, weight trends, oral or nutrition support status, and signs and symptoms of new skin concerns. Relayed recommendations to MD.     Will continue to follow patient per approved [place] [place] Nutrition Prioritization Schedule guidelines. Nutrition Services remains available via IP Nutrition Consult should patient medical status change.    Elder Sons, MSc,MS, RD, CNSC, CCTD  pager # [phone]  Senior Clinical Dietitian    02/20/2024

## 2024-02-20 NOTE — Progress Notes (Signed)
 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), chronic back pain, recurrent cystitis w/ hematuria (s/p L nephrostomy tube), gout, HTN     Interval Events  Norepi restarted overnight      Physical Examination:  Temperature:  [97.1 F (36.2 C)-100.2 F (37.9 C)] 98.7 F (37.1 C) (03/04 0800)  Blood pressure (BP): (74-133)/(39-95) 127/56 (03/04 0800)  Heart Rate:  [44-89] 72 (03/04 0740)  Respirations:  [10-34] 14 (03/04 0800)  Pain Score: 8 (03/04 0800)  O2 Device: Heated High Flow Blender (03/04 0800)  O2 Flow Rate (L/min):  [4 l/min-45 l/min] 40 l/min (03/04 0800)  SpO2:  [91 %-100 %] 99 % (03/04 0800)    FiO2 (%): 60 %      Intake/Output Summary (Last 24 hours) at 02/20/2024 9192  Last data filed at 02/20/2024 0800  Gross per 24 hour   Intake 699.97 ml   Output 1485 ml   Net -785.03 ml         GEN: Well developed, cachectic, in no acute distress  EYES: eomi, per, anicteric  ENT: dry mucous membranes, no discharge, hearing intact to conversation  RESP: no increased work of breathing and good aeration  CV: regular rate, regular rhythm, and no murmurs, no extra heart sounds  ABD: non-distended, no tenderness to palpation  EXTREM: no clubbing and no edema  NEURO: cnii-xii grossly intact, grossly normal sensation/strength/coordination  SKIN: no rashes or bruises on exposed skin        Labs:  reviewed; Chem panel with, hypernatremia, metabolic alkalosis, AKI, CBC with, anemia, and elevated cardiac enzymes    Imaging:  reviewed; no new imaging    Diagnostics:  reviewed;         Assessment and Plan:  [name] is critically ill and admitted to the ICU with     Problems:  . Septic shock - due to pneumonia  . Pneumonia - due to aspiration  . Metastatic urothelial carcinoma - on erdafitnib without curative options  . NSTEMI - elevated troponins, new wall  motion abnormalities, completed 72hrs of heparin  drip  . Variable arrhythmias - due to above     Plans:  - judicious use of fluids and vasopressors to maintain MAP > 65  - continue abx coverage with zosyn   - supplemental O2 to maintain SpO2 > 93%  - goal fluid balance natural, provide small bolus today for GI losses  - the following services were consulted for interventions/recommendations: Cardiology and signed off   - will attempt conversion to oral midodrine  to facilitate transfer for out of hospital hospice care per patient wishes     Agitation/Analgesia: hydromorphone   Breathing: as above  Catheters: as below  Delirium: monitor  DVT/Mobilizaiton: heparin -100 Units,heparin -100 Units   Endo/Glucose: BG goal 140-180   Gut: diet - patient directed, npo, tube feeds holding due to diarrhea  Skin: per nursing   Family: updated  Code Status: DNAR - Full Care    Patient Lines/Drains/Airways Status       Active PICC Line / CVC Line / PIV Line / Drain / Airway / Intraosseous Line / Epidural Line / ART Line / Line Type / Wound / Pressure Ulcer Injury       Name Placement date Placement time Site Days    PICC Double Lumen - 02/15/24  Right Basilic 02/15/24  1130  Basilic  4    Port A Cath Single Lumen  - 02/13/24 Subclavian 02/13/24  0221  Subclavian  7    Nephrostomy -  Left 11/29/23  1151  Left  82    Stent -  Ureteral (left) 12/24/21  1102  Ureteral (left)  787    Pressure Ulcer/ Injury -  Stage I (non-blanchable reddness over pressure points) Coccyx 02/13/24  --  -- 7                     allopurinol   100 mg Daily    aspirin   81 mg Daily    busPIRone   20 mg BID    famotidine   20 mg Daily    fluticasone  propionate  2 spray Daily    gabapentin   300 mg TID    ipratropium-albuterol   3 mL 4x Daily    midodrine   15 mg TID    multivitamin with iron-minerals  15 mL Daily    piperacillin -tazobactam (ZOSYN ) IVPB  4,500 mg Q8H    polyethylene glycol  17 g Daily    senna  17.2 mg HS    sodium chloride   10 mL Q8H    sodium chloride   3  mL Q8H    venlafaxine   50 mg BID      norepinephrine  8 mg in sodium chloride  0.9% 250 mL  0-50 mcg/min 3.8 mL/hr at 02/20/24 0800 2 mcg/min at 02/20/24 0800    sodium chloride    10 mL/hr at 02/18/24 1500 Rate Verify at 02/18/24 1500      artificial tears  2 drop Q12H PRN    atropine   0.5 mg Once PRN    bisacodyl   10 mg Q8H PRN    diphenhydrAMINE   25 mg Q4H PRN    heparin   100 Units Once PRN    heparin   100 Units Once PRN    HYDROmorphone   1 mg Q4H PRN    HYDROmorphone   1 mg Q4H PRN    loperamide   2 mg Q6H PRN    nalOXone   0.1 mg Q2 Min PRN    prochlorperazine   10 mg Q6H PRN    prochlorperazine   10 mg Q6H PRN    sodium chloride   10 mL PRN    sodium chloride   3 mL PRN    sodium chloride    Continuous PRN    tizanidine   2 mg Q8H PRN    traZODone   50 mg Nightly PRN             Lang Beverley Benders, MD, MA      This patient is critically ill due to vital organ failure including: septic shock.  The patient has a high probability of imminent deterioration due to the above and requires critical care management by me, including high complexity medical decision making and the assessment, manipulation and support of vital organ functions in an attempt to prevent further clinical decline. This included: titration of inotropes/vasopressors.     I personally spent 45 minutes of critical care time evaluating, managing, providing care and documenting critical care activities exclusively for this patient, to which I was immediately available. This was in relation to the organ systems outlined above, and did not involve time spent performing separately reportable (non-bundled) procedures.

## 2024-02-21 NOTE — Interdisciplinary (Signed)
 Occupational Therapy Daily Treatment Note    Admitting Physician:  Con Lang Righter, MD  Admission Date 02/13/2024    Inpatient Diagnosis:   Problem List         Codes    Sepsis, due to unspecified organism, unspecified whether acute organ dysfunction present    -  Primary ICD-10-CM: A41.9  ICD-9-CM: 038.9, 995.91    Decreased activities of daily living (ADL)     ICD-10-CM: Z78.9  ICD-9-CM: V49.89    Dysphagia, oropharyngeal phase     ICD-10-CM: R13.12  ICD-9-CM: 787.22    Difficulty in walking     ICD-10-CM: R26.2  ICD-9-CM: 719.7            IP Start of Service  Start of Care: 02/13/24  Reason for referral: Activity tolerance limitation;Decline in functional ability/mobility;Decline in performance of activities of daily living (ADL)    Preferred Language:English         Past Medical History:   Diagnosis Date    Chronic back pain     Congenital hydronephrosis     Gout     Headache     Hematuria     HTN (hypertension) 12/10/2021    Kidney disease     Kidney stones     Major depressive disorder, single episode     Polyarthropathy or polyarthritis of multiple sites     Retinal detachment     Urethral stricture       Past Surgical History:   Procedure Laterality Date    CT INSERTION OF SUPRAPUBIC CATH  09/25/2015    NEPHRECTOMY Right 1955    APPENDECTOMY      COLONOSCOPY      CYSTOSCOPY      CYSTOSCOPY W/ LASER LITHOTRIPSY      OTHER SURGICAL HISTORY      Interstim 01/29/2011    SPINE SURGERY  09/21    Lumbar-sacral fusion    TRANSURETHRAL RESECTION OF PROSTATE          OT Acute       Row Name 02/21/24 1200          Type of Visit    Type of Occupational Therapy note Occupational Therapy Daily Treatment Note       Row Name 02/21/24 1200          Treatment Time    Treatment Start Time 0955     Total TIMED Treatment (min) --  38     Total Treatment Time (min) 38       Row Name 02/21/24 1200          Treatment Precautions/Restrictions    Precautions/Restrictions Fall;Multiple lines     Fall Socks/charm       Row Name  02/21/24 1200          Medical History    History of presenting condition Bubber Rothert is a 73 year old male w/ hx of 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 Overall appears well on exam with appropriate mentation but having ongoing fluctuation in BP requiring vasopressor support, suspect from sepsis.     Fall history No falls reported in the last 6 months     Dominant Side Right       Row Name 02/21/24 1200          Subjective    Patient status Patient agreeable to  treatment;Nursing in agreement for treatment;Patient pain control adequate to participate in therapy       Row Name 02/21/24 1200          Pain Assessment    Pain Asssessment Tool Numeric Pain Rating Scale       Row Name 02/21/24 1200          Numeric Pain Rating Scale    Pain Intensity - rating at present 3     Pain Intensity- rating after treatment 3     Location lower back/ BLEs (pt reporting history of neuropathy)       Row Name 02/21/24 1200          Activities of Daily Living (ADLs)    Toilet Transfers Minimum assistance (25% assistance)     Other Toilet Transfers Information Simulated at bedside, STS with FWW at [facility]       Row Name 02/21/24 1200          [place] AM-PAC: Daily Activity    Assistance Needed to Put on and Take off Regular Lower Body Clothing 2     Assistance Needed to Bathe, Including Washing, Rinsing, and Drying 2     Assistance Needed to Toilet [employer], Bedpan, or Urinal) 2     Assistance Needed to Put on and Take off Regular Upper Body Clothing 3     Assistance Needed to Take Care of Personal Grooming Such as Brushing Teeth 3     Assistance Needed to Eat Meals 3     AM-PAC Daily Activity Total Score 15     AMP-PAC Daily Activity Impairment rating Score 14-19 - 40-59% impaired       Row Name 02/21/24 1200          Objective    Overall Cognitive Status Intact -  no cognitive limitations or impairments noted     Other  Cognitive Status Information Following simple commands     Communication No communication limitations or impairments noted. Current status of hearing, speech and vision allow functional communication.     Coordination/Motor control Other (comment)     Coordination/Motor Control  Information Neuropathy in fingers     Balance Balance limitations present     Static Sitting Balance Good - able to maintain balance without handhold support, limited postural sway     Dynamic Sitting Balance Good - accepts moderate challenge, able to maintain balance while picking object off floor     Static Standing Balance Fair - able to maintain balance with handhold support, may require occasional minimal assistance     Dynamic Standing Balance Fair - accepts minimal challenge, able to maintain balance while turning head/trunk     Other Balance Information Sitting balance: supervision  Standing balance: CGA with FWW, min A) with [facility]     Extremity Assessment Range of motion, strength,  muscle tone and/or sensation limitations present     LUE findings Grossly 4-/5     RUE findings Grossly 4-/5     LLE findings Defer to PT     RLE findings Defer to PT     Functional Mobility Functional mobility deficits present     Bed Mobility Minimum assistance (25% assistance)     Bed Mobility Comments supine > EOB towards R     Transfers to/from Stand Minimum assistance (25% assistance)     Transfer Comments STS with [facility]WW at [facility]     Ambulation during functional tasks Minimum assistance (25% assistance);Other (comments)  CGA  Device used for [employer] bed > 3-4 steps to bedside chair     Other Objective Findings Chart reviewed. RN cleared pt for therapy after pt BP improves after RN increased levo. RN remains at bedside to continue monitoring BP . Pt received semi supine in bed and agreeable to therapy. Pt completes bed mobility at min A)  and required 3-5 minutes seated rest break. Pt completes functional mobility with FWW at [facility] to complete 4-5 steps to bedside chair. Observation of pt soiled and required to stand ~1-2 minutes to receive max A) for posterior pericare hygiene in standing prior to sitting in chair. After brief seated rest break, pt requesting for increase seat cushion. Pt completes final STS with [facility] at heavy min A) to stand for brief <1 minute for RN to position cushion. At end of session, pt is in bedside chair, call light within reach/all needs met. Vital signs stable. Care endorsed back to RN present at bedside.  BP pre supine 78/49(60)  BP supine after levo 110/54 (68)  BP EOB 101/46 (62)  BP EOB after levo 112/44 (65)  BP post chair 119/56 (75)                   OT Acute Tool Box       Row Name 02/21/24 1200          Cognition Assessment    Overall Cognitive Status Intact - no cognitive limitations or impairments noted                        Eval cont.       Row Name 02/21/24 1200          Patient/Family Education    Learner(s) Patient     Learner response to rehab patient education interventions Verbalizes understanding;Able to return demonstrate teaching     Patient/family training comments OT role/POC, recommendations, safety       Row Name 02/21/24 1200          Assessment    Assessment Pt with good tolerance for therapy. Pt completes functional mobility at min A) and safe ADLs at mod A) 2/2 decreased activity tolerance and generalized weakness. Pt will benefit from further IPOT to maximize activity tolerance and functional skills required for safety.     POST ACUTE REHABILITATION DISCHARGE RECOMMENDATIONS:    Recommended Post-Acute Therapy Follow up:   Ongoing Skilled Therapy after Discharge    If available, recommend discharge to a setting with the following assistance for the listed deficits:     Deficits :   All mobility related ADLs assist level: Minimum (25% or less assistance)  Toileting assist level: Moderate  (25-50% assistance)  Bathing assist level: Moderate (25-50% assistance)  Upper Body Dressing assist level: Minimum (25% or less assistance)  Lower Body Dressing assist level: Maximum (50-75% assistance)      Other Post Acute Discharge Recommendations : n/a    Equipment recommendations:   To be determined as patient progresses in therapy        Rehab Potential Good       Row Name 02/21/24 1200          Planned Therapy Interventions and Rationale    Patient Education to improve energy conservation measures during functional activities;to increase independence in functional activities;to increase safety during dynamic activities     Self-Care/ADL Training to improve independence with adaptive equipment;to improve independence  with compensatory strategies;to improve energy conservation measures during functional activities;to improve home safety;to improve safety while using an assistive device;to improve safety when completing daily activities and self care     Therapeutic Activities to improve ability to perform self care and ADL's;to restore functional performace using graded activities;to improve transfers between surfaces     Therapeutic Exercise to improve activity tolerance to allow greater independence with self care and ADL's;to increase strength to allow greater independence with self care and ADL's       Row Name 02/21/24 1200          Treatment Plan Disussion    Treatment Plan Discussion and Agreement Patient support system determined and all questions were asked and answered;Patient/family/caregiver stated understanding and agreement with the therapy plan       Row Name 02/21/24 1200          Treatment Plan    Continue therapy to address Activity tolerance limitation;Decline in functional ability/mobility;Decline in performance of activities of daily living (ADL)     Frequency of treatment 4 times per week     Duration of treatment (number of visits) Treatment will continue while in hospital and in need of  skilled therapy services     Status of treatment Patient evaluated and will benefit from ongoing skilled therapy       Row Name 02/21/24 1200          Patient Safety Considerations     Patient safety considerations Call light left in reach and fall precautions in place;Patient may be at risk for falls;Nursing notified of safety considerations at end of treatment;Patient left sitting at end of treatment     Patient assistive device requirements for safe ambulation Vannie Ports Name 02/21/24 1200          Therapy Plan Communication    Therapy Plan Communication Discussed therapy plan with Nursing and/or Physician;Encouraged out of bed with assistance by     Encouraged out of bed with assistance by Nursing;Staff       Row Name 02/21/24 1200          Occupational Therapy Patient Discharge Instructions    Your Occupational Therapist suggests the following Continue to complete your self care Activities of Daily Living as frequently as possible;Continue to use energy conservation, pursed lip breathing and self-pacing when completing your self care Activities of Daily Living       Row Name 02/21/24 1200          Therapeutic Procedures    Therapeutic Activities (97530) Assistance/facilitation of bed mobility;Functional activities;Progressive mobilization to improve functional independence;Patient education         Total TIMED Treatment (min) 23     Self-Care/ADL Training (02464) Activities of daily living training;Dressing;Grooming;Patient education;Personal hygiene;Safety procedures;Self-care activities of dally living         Total TIMED Treatment (min) 15                     The occupational therapist of record is endorsed by evaluating occupational therapist.

## 2024-02-21 NOTE — Progress Notes (Signed)
 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), chronic back pain, recurrent cystitis w/ hematuria (s/p L nephrostomy tube), gout, HTN     Interval Events  Norepi restarted overnight      Physical Examination:  Temperature:  [97.4 F (36.3 C)-98.3 F (36.8 C)] 98.3 F (36.8 C) (03/05 0800)  Blood pressure (BP): (73-159)/(47-128) 105/57 (03/05 0800)  Heart Rate:  [40-117] 57 (03/05 0800)  Respirations:  [10-28] 19 (03/05 0800)  Pain Score: Patient Sleeping, Respiratory Assessment Done (03/05 0400)  O2 Device: Heated High Flow Blender (03/05 0700)  O2 Flow Rate (L/min):  [30 l/min-40 l/min] 40 l/min (03/05 0700)  SpO2:  [87 %-100 %] 96 % (03/05 0800)    FiO2 (%): 50 %      Intake/Output Summary (Last 24 hours) at 02/21/2024 9167  Last data filed at 02/21/2024 0700  Gross per 24 hour   Intake 1307.37 ml   Output 1575 ml   Net -267.63 ml         GEN: Well developed, cachectic, in no acute distress  EYES: eomi, per, anicteric  ENT: dry mucous membranes, no discharge, hearing intact to conversation  RESP: no increased work of breathing and good aeration  CV: regular rate, regular rhythm, and no murmurs, no extra heart sounds  ABD: non-distended, no tenderness to palpation  EXTREM: no clubbing and no edema  NEURO: cnii-xii grossly intact, grossly normal sensation/strength/coordination  SKIN: no rashes or bruises on exposed skin        Labs:  reviewed; no new labs    Imaging:  reviewed; no new imaging    Diagnostics:  reviewed;         Assessment and Plan:  Stephen [name] is critically ill and admitted to the ICU with     Problems:  . Septic shock - due to pneumonia  . Pneumonia - due to aspiration  . Metastatic urothelial carcinoma - on erdafitnib without curative options  . NSTEMI - elevated troponins, new wall motion abnormalities, completed 72hrs of  heparin  drip  . Variable arrhythmias - due to above     Plans:  - judicious use of fluids and vasopressors to maintain MAP > 65  - continue abx coverage with zosyn   - supplemental O2 to maintain SpO2 > 93%  - goal fluid balance natural, provide small bolus today for GI losses  - the following services were consulted for interventions/recommendations: Cardiology and signed off   - have attempted conversion to oral midodrine  to facilitate transfer for out of hospital hospice care per patient wishes     Agitation/Analgesia: hydromorphone   Breathing: as above  Catheters: as below  Delirium: monitor  DVT/Mobilizaiton: heparin -100 Units,heparin -100 Units   Endo/Glucose: BG goal 140-180   Gut: diet - patient directed, npo, tube feeds holding due to diarrhea  Skin: per nursing   Family: updated  Code Status: DNAR - Full Care    Patient Lines/Drains/Airways Status       Active PICC Line / CVC Line / PIV Line / Drain / Airway / Intraosseous Line / Epidural Line / ART Line / Line Type / Wound / Pressure Ulcer Injury       Name Placement date Placement time Site Days    PICC Double Lumen - 02/15/24 Right Basilic 02/15/24  1130  Basilic  5    Port A Cath Single Lumen  - 02/13/24 Subclavian 02/13/24  0221  Subclavian  8    Nephrostomy -  Left 11/29/23  1151  Left  83    Stent -  Ureteral (left) 12/24/21  1102  Ureteral (left)  788    Pressure Ulcer/ Injury -  Stage I (non-blanchable reddness over pressure points) Coccyx 02/13/24  --  -- 8                     allopurinol   100 mg Daily    aspirin   81 mg Daily    busPIRone   20 mg BID    famotidine   20 mg Daily    fluticasone  propionate  2 spray Daily    gabapentin   300 mg TID    ipratropium-albuterol   3 mL 4x Daily    midodrine   15 mg TID    multivitamin with iron-minerals  15 mL Daily    piperacillin -tazobactam (ZOSYN ) IVPB  4,500 mg Q8H    polyethylene glycol  17 g Daily    senna  17.2 mg HS    sodium chloride   10 mL Q8H    sodium chloride   3 mL Q8H    venlafaxine   50 mg BID       norepinephrine  8 mg in sodium chloride  0.9% 250 mL  0-50 mcg/min   Stopped at 02/21/24 0605    sodium chloride    10 mL/hr at 02/18/24 1500 Rate Verify at 02/18/24 1500      artificial tears  2 drop Q12H PRN    atropine   0.5 mg Once PRN    bisacodyl   10 mg Q8H PRN    diphenhydrAMINE   25 mg Q4H PRN    heparin   100 Units Once PRN    heparin   100 Units Once PRN    HYDROmorphone   1 mg Q4H PRN    HYDROmorphone   1 mg Q4H PRN    loperamide   2 mg Q6H PRN    nalOXone   0.1 mg Q2 Min PRN    prochlorperazine   10 mg Q6H PRN    prochlorperazine   10 mg Q6H PRN    sodium chloride   10 mL PRN    sodium chloride   3 mL PRN    sodium chloride    Continuous PRN    tizanidine   2 mg Q8H PRN    traZODone   50 mg Nightly PRN             Lang Beverley Benders, MD, MA      This patient is critically ill due to vital organ failure including: septic shock.  The patient has a high probability of imminent deterioration due to the above and requires critical care management by me, including high complexity medical decision making and the assessment, manipulation and support of vital organ functions in an attempt to prevent further clinical decline. This included: titration of inotropes/vasopressors.     I personally spent 45 minutes of critical care time evaluating, managing, providing care and documenting critical care activities exclusively for this patient, to which I was immediately available. This was in relation to the organ systems outlined above, and did not involve time spent performing separately reportable (non-bundled) procedures.

## 2024-02-21 NOTE — Plan of Care (Signed)
 Problem: Promotion of Health and Safety  Goal: Promotion of Health and Safety  Description: The patient remains safe, receives appropriate treatment and achieves optimal outcomes (physically, psychosocially, and spiritually) within the limitations of the disease process by discharge.    Information below is the current care plan.  Outcome: Progressing  Flowsheets  Taken 02/21/2024 0606 by Joane Postel Yun Yee, RN  Individualized Interventions/Recommendations #1: Monitor resp status, collaborate w/ RT  Individualized Interventions/Recommendations #2 (if applicable): Monitor blood pressure, MAP>65  Individualized Interventions/Recommendations #3 (if applicable): Manage pain, PRN's  Individualized Interventions/Recommendations #4 (if applicable): Turn q2, offload bony prominences  Outcome Evaluation (rationale for progressing/not progressing) every shift: Pt A&O x4, c/o of generalized pain - given PRN hydromorphone  w/ good relief. Remains on Hi Flo 40L/50%, no desaturation episodes. Coughing intermittently but no sputum. Coarse lung sounds. Mouth swabbed with moisturizer. SB 40's w/ PVC's, PAC's, MAP maintained w/ levo. L nephrostomy w/ good UOP.  x1 large BM.  Taken 02/20/2024 2000 by Cyrena Corean Talitha Elias, RN  Patient /Family stated Goal: pain management  Taken 02/16/2024 0542 by Luinstra, Kristin, RN  Guidelines: Inpatient Nursing Guidelines  Note:

## 2024-02-21 NOTE — Plan of Care (Signed)
 Problem: Promotion of Health and Safety  Goal: Promotion of Health and Safety  Description: The patient remains safe, receives appropriate treatment and achieves optimal outcomes (physically, psychosocially, and spiritually) within the limitations of the disease process by discharge.    Information below is the current care plan.  Outcome: Progressing  Flowsheets  Taken 02/21/2024 1752 by Peter Lazaro Lisle, RN  Outcome Evaluation (rationale for progressing/not progressing) every shift: Afebrile, intermittently on Levo. Pain managed w/ Dilaudid . NPO. Frequent oral care for dry mouth. Up w/ PT/OT to cardiac chair. Voiding qs, loose stools. No family contact.  Taken 02/21/2024 0800 by Peter Lazaro Lisle, RN  Patient /Family stated Goal: Manage pain and nausea  Taken 02/21/2024 0606 by Cyrena Corean Talitha Elias, RN  Individualized Interventions/Recommendations #1: Monitor resp status, collaborate w/ RT  Individualized Interventions/Recommendations #2 (if applicable): Monitor blood pressure, MAP>65  Individualized Interventions/Recommendations #3 (if applicable): Manage pain, PRN's  Individualized Interventions/Recommendations #4 (if applicable): Turn q2, offload bony prominences  Taken 02/16/2024 0542 by Luinstra, Kristin, RN  Guidelines: Inpatient Nursing Guidelines  Note:             Problem: Comfort Care/End of Life  Goal: Comfort Care/End of Life  Description: Patient and/or family provided with symptom management, psychosocial and spiritual support at the end of life.    Information below is the current care plan.  Outcome: Progressing

## 2024-02-21 NOTE — Interdisciplinary (Signed)
 Physical Therapy Daily Treatment Note    Admitting Physician:  Con Lang Righter, MD  Admission Date 02/13/2024    Inpatient Diagnosis:   Problem List         Codes    Sepsis, due to unspecified organism, unspecified whether acute organ dysfunction present    -  Primary ICD-10-CM: A41.9  ICD-9-CM: 038.9, 995.91    Decreased activities of daily living (ADL)     ICD-10-CM: Z78.9  ICD-9-CM: V49.89    Dysphagia, oropharyngeal phase     ICD-10-CM: R13.12  ICD-9-CM: 787.22    Difficulty in walking     ICD-10-CM: R26.2  ICD-9-CM: 719.7            IP Start of Service   Start of Care: 02/16/24  Onset Date: 02/13/2024  Reason for referral: Activity tolerance limitation;Decline in functional ability/mobility    Preferred Language:English         Past Medical History:   Diagnosis Date    Chronic back pain     Congenital hydronephrosis     Gout     Headache     Hematuria     HTN (hypertension) 12/10/2021    Kidney disease     Kidney stones     Major depressive disorder, single episode     Polyarthropathy or polyarthritis of multiple sites     Retinal detachment     Urethral stricture       Past Surgical History:   Procedure Laterality Date    CT INSERTION OF SUPRAPUBIC CATH  09/25/2015    NEPHRECTOMY Right 1955    APPENDECTOMY      COLONOSCOPY      CYSTOSCOPY      CYSTOSCOPY W/ LASER LITHOTRIPSY      OTHER SURGICAL HISTORY      Interstim 01/29/2011    SPINE SURGERY  09/21    Lumbar-sacral fusion    TRANSURETHRAL RESECTION OF PROSTATE          PT Acute       Row Name 02/21/24 1800          Type of Visit    Type of Physical Therapy note Physical Therapy Daily Treatment Note       Row Name 02/21/24 1800          Treatment Precautions/Restrictions    Precautions/Restrictions Fall;Multiple lines     Other Precautions/Restrictions Information HFNC 25L/min @30 %       Row Name 02/21/24 1800          Medical History    History of presenting condition Stephen [name] is a 73 year old male w/ hx of 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 Overall appears well on exam with appropriate mentation but having ongoing fluctuation in BP requiring vasopressor support, suspect from sepsis.     Fall history No falls reported in the last 6 months       Row Name 02/21/24 1800          Functional History    Prior Level of Function Minimal deficits     Equipment required for mobility in the home Walker     Other Functional History Information Pt d/c 1/22, was supervision level. Pt reports was IND prior to admission in Jan 2025. Reports has participated minimally in mobility while at SNF.       Row  Name 02/21/24 1800          Social History    Living Situation Lives with parent/family     Home Environment House     Home accessibility  Performs activities of daily living (ADL's) on one level     Other Social History Information Pt admitted from SNF. Was previously living in [facility] with sister who works from home       Row Name 02/21/24 1800          Subjective    Patient status Patient agreeable to treatment;Nursing in agreement for treatment       Row Name 02/21/24 1800          Pain Assessment    Pain Asssessment Tool Numeric Pain Rating Scale     Other Pain Assessment Information requests medication; RN notified       Row Name 02/21/24 1800          Numeric Pain Rating Scale    Pain Intensity - rating at present 6     Pain Intensity- rating after treatment 6     Location hands and feet       Row Name 02/21/24 1800          Objective    Overall Cognitive Status At baseline level     Communication Other (comments)     Other  Communication Information patient hypophonic / unclear in speech     Static Sitting Balance Good - able to maintain balance without handhold support, limited postural sway     Dynamic Sitting Balance Good - accepts moderate challenge, able to maintain balance while picking  object off floor     Static Standing Balance Fair - able to maintain balance with handhold support, may require occasional minimal assistance     Dynamic Standing Balance Fair - accepts minimal challenge, able to maintain balance while turning head/trunk     Other Balance Information CGA in standing with FWW, with limited postural sway and no LOB     Extremity Assessment Range of motion, strength,  muscle tone and/or sensation limitations present     LUE findings WFL, grossly 3+/5     RUE findings WFL, grossly 4/5     LLE findings hip flex 3+/5, quad 4/5, hamstring 4/5, ankle DF/PF 4/5     RLE findings hip flex 3+/5, quad 4/5, hamstring 4/5, ankle DF/PF 4/5     Functional Mobility Functional mobility deficits present     Transfers to/from Stand Minimum assistance (25% assistance)     Transfer Comments FWW with minA, verbal and tactile cues for hand placement and to lean forward     Gait Comments deferred today due to hemodynamic instability     Other Objective Findings Patient greeted in chair and agreeable to participate in chair therex and limited gait, RN clears pt for therapy. Patient performs 10x each LAQ, hip ABD, hip ADD, hip flexion, ankle pumps. Patient then able to stand from recliner with FWW with minA 2 times, and able to perform standing marches, but BP is not maintained thus patient sits back to chair after each trial. Additional sitting exercises performed between standing trials. BP recovers in sitting position in recliner. Patient also performs half STS to change gown and chuck, and then is left to rest in recliner. Patient endorsed back to RN.     Vitals   Pre- HR 56 O2 95% BP 119/56 on 25L HFNC @ 30%  Sitting in chair, nurse titrating medication -  BP 137/56  Standing - BP 70/46  Sitting after first standing trial - BP 110/58  Standing, second trial - BP 93/58  Final - BP 108/52                            Eval cont.       Row Name 02/21/24 1800          [place] AM-PAC: Basic Mobility    Assistance  Needed to Turn from Back to Side While in a Flat Bed Without Using Bedrails 3 - A little (supervised/min assist)     Difficulty with Supine to Sit Transfer 3 - A little (supervised/min assist)     How Much Help Needed to Move to/from Bed to Chair 3 - A little (supervised/min assist)     Difficulty with Sit to Stand Transfer from Chair with Arms 3 - A little (supervised/min assist)     How Much Help Needed to Walk in Room 3 - A little (supervised/min assist)     How Much Help Needed to Climb 3-5 Steps with a Rail 2 - A lot (mod/max assist)     AMPAC Total Score 17     Assessment: AM-PAC Basic Mobility Impairment Rating Score 13-18 - 40-59% impaired       Row Name 02/21/24 1800          Patient/Family Education    Learner(s) Patient     Learner response to rehab patient education interventions Verbalizes understanding     Patient/family training comments chair level HEP; importance of daily mobilization, patient agrees       Row Name 02/21/24 1800          Assessment    Assessment Patient today presents with activity limitations primarily stemming from orthostatic hypotension. Patient demo good strength and endurance of LE in recliner, but is unable to progress to gait due to hypotension. However, with encouragement and planning around pain medication, he is able to mobilize with minA and will benefit from further skilled PT services to increase activity tolerance. Recommend daily OOB with nursing to chair to perform seated exercises.    POST ACUTE REHABILITATION DISCHARGE RECOMMENDATIONS:    Recommended Post-Acute Therapy Follow up:   Ongoing Skilled Therapy after Discharge    If available, recommend discharge to a setting with the following assistance for the listed deficits:     Deficits :   All mobility: assist level: Minimum (25% or less assistance)          Equipment recommendations:   To be determined as patient progresses in therapy           Row Name 02/21/24 1800          Patient stated Goal    Patient  stated goal to get OOB to chair       Row Name 02/21/24 1800          Goal 1 (Short Term)    Impairment Functional mobility limitation     Custom goal Pt will be IND w/ bed mobility     Number of visits 10     Goal Status Continue       Row Name 02/21/24 1800          Goal 2 (Short Term)    Impairment Functional mobility limitation     Custom goal Pt will be MOD IND w/ transfers using LRAD     Number of visits 10  Goal Status Continue       Row Name 02/21/24 1800          Goal 3 (Short Term)    Impairment Gait impairment     Custom goal Pt will be SUP w/ ambulating 293ft using LRAD     Number of visits 10     Goal Status Continue       Row Name 02/21/24 1800          Treatment Plan Disussion    Treatment Plan Discussion and Agreement Patient/family/caregiver stated understanding and agreement with the therapy plan       Row Name 02/21/24 1800          Patient Safety Considerations     Patient safety considerations Patient left sitting at end of treatment;Call light left in reach and fall precautions in place;Patient may be at risk for falls;Patient left with all lines/drains secured and intact and RN notified;Nursing notified of safety considerations at end of treatment     Patient assistive device requirements for safe ambulation Walker;Wheelchair       Row Name 02/21/24 1800          Therapy Plan Communication    Therapy Plan Communication Discussed therapy plan with Nursing and/or Physician       Row Name 02/21/24 1800          Physical Therapy Patient Discharge Instructions    Your Physical Therapist suggests the following Continue to complete your home exercise program daily as instructed;Supervision with walking is suggested for increased safety;Continue to use your assistive device as instructed when walking to improve your stability and prevent falls;Continue to use your wheelchair for mobility until your Physician has indicated that you may progress with your activity level       Row Name 02/21/24 1800           Therapeutic Procedures    Neuromuscular re-education [phone])  Balance activities to improve control of center of gravity over base of support;Balance reaches - upper extremity;Coordination activities;Detection of limits of stability and body position in space;Facilitation of protective responses and balance reactions;Patient education;Static balance training;Trunk alighment/stability activities        Total TIMED Treatment (min)  15     Therapeutic Activities (97530)  Weight shift activities to improve safety in unsupported sitting or standing;Patient education;Functional activities;Assistance/facilitation of bed mobility;Facillitation of safety awareness/responses during functional tasks;Progressive mobilization to improve functional independence;Transfer training with weight shift and direction change        Total TIMED Treatment (min)  15     Therapeutic exercise  (02889)  Assisted exercises with tactile cues/facilitation to increase muscle recruitment;Home Exercise Program (HEP) demonstration and performance;Patient education;Range of motion exercises;Strengthening exercises        Total TIMED Treatment (min)  30       Row Name 02/21/24 1800          Treatment Time     Total TIMED Treatment  (min) 60     Total Treatment Time (min) 60     Treatment start time 1030                        The physical therapist of record is endorsed by evaluating physical therapist.

## 2024-02-21 NOTE — Interdisciplinary (Signed)
 02/21/24 1013   Follow Up/Progress   Is the Patient Medically Stable for Discharge * No   Where was the patient admitted from? * Skilled Nursing Facility   Facility Name and Address [facility]   Barriers to Discharge * None   Anticipated Discharge Disposition/Needs * Hospice facility (Skilled Nursing Facility)   Post Acute Services Referred To Hospice Facility (Skilled Nursing Facility)   Hospice facility status Pending   PATIENT CHOICE: Patient/Family/Legal/Surrogate Decision Maker Has Been Given a List Options And Choice In The Selection of Post-Acute Care Providers Yes   Family/Caregiver's Assessed for * Readiness to provide care to the patient;Readiness, willingness, and ability to provide or support self-management activities   Respite Care * Not Applicable   Patient/Family/Other Are In Agreement With Discharge Plan * To be determined   Public Health Clearance Needed * Not Applicable   Anticipated DischargeTransportation *  Ambulance   Anticipated Discharge Transportation Details * will need BLS transport upon DC     02/21/24  10:14 AM    Medical Intervention(s) requiring continued Hospital Stay:  HFNC     Anticipated dispo plan & anticipated DC needs:  DCP: Hospice at SNF.    On 3/4, patient signed consents with [facility].     CM spoke with [facility] placement coordinator Jocelyn ph # [phone] informed CM that at this time they do not have any SNF beds in the community.    CM spoke with patient at bedside and updated him. CM provided him information on [facility] homes. Patient gave consent to CM to send referrals. And states I'll think about it and get back to you.    CM inquired if patient was willing to consider [facility] hosp[facility] services. Patient states,  no, it's to far for my sister who lives in [place].    Barriers to Discharge:  No accepting SNF in community for Hospice care     Silas Blanch, RN  RN Care Manager

## 2024-02-22 ENCOUNTER — Other Ambulatory Visit: Payer: Self-pay

## 2024-02-22 DIAGNOSIS — C679 Malignant neoplasm of bladder, unspecified: Secondary | ICD-10-CM

## 2024-02-22 MED ORDER — IPRATROPIUM-ALBUTEROL 0.5-2.5 (3) MG/3ML IN SOLN
3.0000 mL | Freq: Four times a day (QID) | RESPIRATORY_TRACT | Status: DC
Start: 2024-02-22 — End: 2024-02-22

## 2024-02-22 MED ORDER — IPRATROPIUM-ALBUTEROL 0.5-2.5 (3) MG/3ML IN SOLN
3.0000 mL | Freq: Four times a day (QID) | RESPIRATORY_TRACT | Status: DC | PRN
Start: 2024-02-22 — End: 2024-02-22

## 2024-02-22 NOTE — Discharge Summary (Signed)
 Date of Admission:  02/13/2024  Date of Discharge:  02/22/24    Patient Name:  Stephen [name]    Principal Diagnosis (required):  Septic Shock    Hospital Problem List (required):  There are no hospital problems to display for this patient.    #Septic Shock  #Recurrent Aspiration PNA  #Acute Hypoxic Respiratory Failure  #NSTEMI w/ HFrEF  #Metastatic Urothelial Cancer    Additional Hospital Diagnoses (rule out or suspected diagnoses, etc.):  Malnutrition Diagnosis (if any): Severe Malnutrition     Malnutrition Designation: Severe         Principal Procedure Performed During This Hospitalization (required):  Placement of PICC line 02/27  NGT placement    Other Procedures Performed During This Hospitalization (required):  Procedure results are available in Chart Review in Epic.  For those providers external to [facility], the key procedure results are listed below:    PICC Placement 02/27  Post Procedure  Post-procedure: dressing applied and antimicrobial patch applied  Estimated Blood Loss: 5 mL  Assessment: blood return through all ports and placement verified by x-ray  Complications: none     Follow up: portable chest xray ordered and flush protocol by guideline  Number of puncture attempts: 1  Line placement successful: Yes     Catheter tip location: SVC     Final length internal catheter: 36  Final length external catheter (cm): 3  Patient tolerance: patient tolerated the procedure well with no immediate complications        Comments: 36/3 R basilic    Consultations Obtained During This Hospitalization:  Cardiology  Hematology/Oncology  Nutrition Services  Physical Therapy  Wound Care Service  Occupational Therapy  Speech Therapy  Pharmacy    Key consultant recommendations:  Cardiology Recs:  Clinical presentation, with no CP, but dynamic biomarkers and new WMA in the context of septic shock is more consistent with a T2MI and less so of ACS. 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      This AM developed sinus bradycardia with frequent PVCs, followed by AIVR and polymorphic NSVT, in the context of worsening shock. Pt denies any symptoms during this, except for sacral pain from wound. On exam is WWP and mentating. 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    Heme/Onc Recs:  If he has progression of disease, treatment options would include cytotoxic chemotherapy which Deatrice is certainly not a candidate for now, and may not be in the future given his significantly declined functional status.  If POD on scans, his prognosis is very likely less than 6 months, and likely much less.       At this time, I recommend continued supportive care for PNA.  When stable, it would be helpful to obtain a CT CAP for restaging.  Appreciate care from the ED an ICU teams.    Reason for Admission to the Hospital / Initial Presentation:  Patient is a 73 y.o. M with metastatic urothelial carcinoma, OA, HTN, MDD, and gout who presents from a skilled nursing facility for malaise, hypoxia and bradycardia. He was coughing phlegm at that time that was thick and gray in color. He is not coughing as much but still has some. It's more dry now. He was given amoxicillin  as an outpatient. He denies fevers, chills or coughing up blood.  He does have some muscle aches. He denies chest pain or palpitations. He denies nausea or vomiting. He has a PCN for urination and denies issues with this. He denies abdominal pain. He denies new rashes. He has a right-sided chest port. He has a little swelling in his left leg. He came in today as his blood pressure and heart rate were low. He reports feeling tired at the time but he was sleeping at the time. His heart rate is typically in the 60s. He doesn't have any prior smoking or pulmonary history.  He denies sick contacts at the nursing facility. He has some dry mouth syndrome so he does cough from that as well. He denies a loss of appetite but is eating less. He has been losing weight. 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     GOC - he reports that he has seen palliative care and has had sepsis in the past. He doesn't really want to be on a vent but would accept it to save his life. He would want CPR as well. If he is unable to speak for himself, he would want his sister as the management consultant.     Hospital Course by Problem (required):  2/26--remains on pressors; troponin increased and echo had WMA; cardiology consulted   2/27--had bradyarrythmia today with NSVT; heparin  gtt started; pressors increased  3/1 -- no escalation of care. Dnr/dni. Hospice referral. Will transition to comfort care at time of discharge. He does not want to pass in a hospital.  3/6 -- Ongoing discussions with the patient and family about timing and going to home hospice. Patient able to leave the hospital via Transport to care facility. Sister will be back in the area today so timing is more optimal for the patient to leave today.    Tests Outstanding at Discharge Requiring Follow Up:  None    Discharge Condition (required):  Poor.    Key Physical Exam Findings at Discharge:  Mental Status Exam: Patient is alert and oriented to person, place, time, and situation.  No significant physical examination findings at the time of discharge.    Discharge Diet:  Regular.    Discharge Disposition:  Hospice Home      Discharge Code Status:  Do Not Attempt Resuscitation / comfort measures only  This is a change in the patient's code status compared to admission.  The reason for the change in code status is as follows:  poor prognosis of the disease    Discharge Medications:     What To Do With Your Medications        CONTINUE taking these medications        Add'l Info   Alaway  0.035 % (0.025 % base) ophthalmic solution  Place 1 drop into  both eyes 2 times daily.  Generic drug: ketotifen  fumarate   Refills: 0     allopurinol  100 MG tablet  Commonly known as: ZYLOPRIM   Take 1 tablet (100 mg) by mouth daily.   Quantity: 30 tablet  Refills: 0     Antacid Regular Strength 200-200-20 MG/5ML suspension  Mix in equal parts Benadryl , Maalox, and lidocaine  and swish and spit or swallow 10 ml of mixture by mouth every 4 hours as needed for pain. If swallowed, wait 10 minutes prior to eating/drinking. May also apply medication to Q-tip and dab on areas of sores  (Mix viscous lidocaine  2%, Mylanta, and Benadryl  liquid into a 1:1:1 solution.  Take 10mL by mouth every 4  hours as needed for pain.  Swish and spit or swallow for pain. If swallowed, wait 10 minutes prior to eating/drinking. May also apply medication to a Q-tip and dab on areas of sores.)  Generic drug: aluminum -magnesium -simethicone    Quantity: 355 mL  Refills: 2     Balversa  3 MG Tabs  Take 2 tablets by mouth daily.  (Take 6 mg by mouth daily.)  Generic drug: Erdafitinib    Quantity: 60 tablet  Refills: 11     busPIRone  10 MG tablet  Commonly known as: BUSPAR   Take 2 tablets (20 mg) by mouth 2 times daily.   Quantity: 60 tablet  Refills: 2     diphenhydrAMINE  12.5 MG/5ML liquid  Commonly known as: BENADRYL   Mix in equal parts Benadryl , Maalox, and lidocaine  and swish and spit or swallow 10 ml of mixture by mouth every 4 hours as needed for pain. If swallowed, wait 10 minutes prior to eating/drinking. May also apply medication to Q-tip and dab on areas of sores  (Mix viscous lidocaine  2%, Mylanta, and Benadryl  liquid into a 1:1:1 solution.  Take 10mL by mouth every 4 hours as needed for pain.  Swish and spit or swallow for pain. If swallowed, wait 10 minutes prior to eating/drinking. May also apply medication to a Q-tip and dab on areas of sores.)   Quantity: 236 mL  Refills: 2     DULoxetine  20 MG CR capsule  Commonly known as: CYMBALTA   Take 1 capsule (20 mg) by mouth 2 times daily.   Quantity: 180  capsule  Refills: 0     fluticasone  propionate 50 MCG/ACT nasal spray  Commonly known as: FLONASE   Spray 2 sprays into each nostril daily.   Quantity: 16 g  Refills: 2     gabapentin  300 MG capsule  Commonly known as: NEURONTIN   Take 1 capsule (300 mg) by mouth 3 times daily.   Quantity: 90 capsule  Refills: 2     HYDROmorphone  2 MG tablet  Commonly known as: DILAUDID   Take 1 to 2 tablets (2mg  to 4mg ) by mouth every 4 hours as needed for moderate to severe pain   Quantity: 90 tablet  Refills: 0     Keytruda  100 MG/4ML Soln  Generic drug: pembrolizumab    Refills: 0     lidocaine  2 % solution  Commonly known as: XYLOCAINE   Mix in equal parts Benadryl , Maalox, and lidocaine  and swish and spit or swallow 10 ml of mixture by mouth every 4 hours as needed for pain. If swallowed, wait 10 minutes prior to eating/drinking. May also apply medication to Q-tip and dab on areas of sores  (Mix viscous lidocaine  2%, Mylanta, and Benadryl  liquid into a 1:1:1 solution.  Take 10mL by mouth every 4 hours as needed for pain.  Swish and spit or swallow for pain. If swallowed, wait 10 minutes prior to eating/drinking. May also apply medication to a Q-tip and dab on areas of sores.)   Quantity: 200 mL  Refills: 2     multivitamin with minerals Tabs tablet  Take 1 tablet by mouth daily.   Quantity: 30 tablet  Refills: 1     naloxone  4 mg/0.1 mL nasal spray  Commonly known as: NARCAN   For suspected opioid overdose, call 911! Then spray once in one nostril. Repeat after 3 minutes if no or minimal response using a new spray in other nostril.   Quantity: 2 each  Refills: 0     Nepro Liqd  Take 3 Cans by  mouth daily.   Quantity: 30000 mL  Refills: 11     nystatin  100,000 units/mL suspension  Commonly known as: MYCOSTATIN   Swish and swallow 5mL by mouth 4 times daily  (5 mL (500,000 Units) by Swish & Swallow route 4 times daily.)   Quantity: 280 mL  Refills: 0     polyethylene glycol 17 GM/SCOOP powder  Commonly known as: GLYCOLAX   Take 17 grams  (1 capful) by mouth daily. Mix with 4 to 8 ounces of fluid (water , juice, soda, coffee, or tea) prior to administration.   Quantity: 238 g  Refills: 0     prochlorperazine  10 MG tablet  Commonly known as: COMPAZINE   Take 1 tablet (10 mg) by mouth every 6 hours as needed (Nausea/Vomiting).   Quantity: 90 tablet  Refills: 2     senna 8.6 MG tablet  Commonly known as: SENOKOT  Take 2 tablets (17.2 mg) by mouth at bedtime.   Quantity: 60 tablet  Refills: 3     SOOTHE XP OP   Refills: 0     tizanidine  2 MG tablet  Commonly known as: ZANAFLEX   Take 1 tablet (2 mg) by mouth every 8 hours as needed (muscle spasm).   Quantity: 30 tablet  Refills: 2     traZODone  50 MG tablet  Commonly known as: DESYREL   Take one tab nightly by mouth at bedtime as needed for insomnia   Quantity: 90 tablet  Refills: 0              Allergies:  Allergies   Allergen Reactions    Sulfa Drugs Hives, Other and Unspecified       Follow Up Appointments:    Scheduled appointments:  None    For appointments requested for after discharge that have not yet been scheduled, refer to the Post Discharge Referrals section of the After Visit Summary.    Discharging Physician's Contact Information:  [facility] operator at [phone].    Time Spent - (for use by billing providers only)  35 minutes were spent in direct patient care activities, discharge planning, and coordination of care.    Cedar Ditullio, MD  Fellow

## 2024-02-22 NOTE — Interdisciplinary (Signed)
 [facility] discharge pick up time 1315

## 2024-02-22 NOTE — Addendum Note (Signed)
 Encounter addended by: Antonio Baumgarten, MD on: 02/22/2024 9:12 AM   Actions taken: Clinical Note Signed

## 2024-02-22 NOTE — Plan of Care (Signed)
 Problem: Promotion of Health and Safety  Goal: Promotion of Health and Safety  Description: The patient remains safe, receives appropriate treatment and achieves optimal outcomes (physically, psychosocially, and spiritually) within the limitations of the disease process by discharge.    Information below is the current care plan.  Outcome: Progressing  Flowsheets (Taken 02/22/2024 0234)  Individualized Interventions/Recommendations #1: Pain management  Individualized Interventions/Recommendations #2 (if applicable): Monitor BP and maintain MAp above 65, and use ordered pressors if needed  Individualized Interventions/Recommendations #3 (if applicable): Monitor resp. status and work with RT  Individualized Interventions/Recommendations #4 (if applicable): Turn q2h  Individualized Interventions/Recommendations #5 (if applicable): Promote rest by clustering care  Outcome Evaluation (rationale for progressing/not progressing) every shift: Pain managed with PRN meds. NPO. Worked with RT to find proper o2 needs. Monitored BP. Turned patient q2h  Note:

## 2024-02-22 NOTE — Interdisciplinary (Signed)
 Patient discharged with transportation at 1330. All belongings given to patient. Discharge paperwork discussed and questions answered. LDSs remain intact per facility, Right DL PICC, Dob/Hov, Right port cath, nephrostomy.

## 2024-02-22 NOTE — Plan of Care (Signed)
 Problem: Promotion of Health and Safety  Goal: Promotion of Health and Safety  Description: The patient remains safe, receives appropriate treatment and achieves optimal outcomes (physically, psychosocially, and spiritually) within the limitations of the disease process by discharge.    Information below is the current care plan.  Outcome: Progressing  Flowsheets  Taken 02/22/2024 0234 by Delana Drones, RN  Individualized Interventions/Recommendations #1: Pain management  Individualized Interventions/Recommendations #2 (if applicable): Monitor BP and maintain MAp above 65, and use ordered pressors if needed  Individualized Interventions/Recommendations #3 (if applicable): Monitor resp. status and work with RT  Individualized Interventions/Recommendations #4 (if applicable): Turn q2h  Individualized Interventions/Recommendations #5 (if applicable): Promote rest by clustering care  Taken 02/21/2024 0800 by Humphrey, Lazaro Lisle, RN  Patient /Family stated Goal: Manage pain and nausea  Taken 02/16/2024 0542 by Luinstra, Kristin, RN  Guidelines: Inpatient Nursing Guidelines  Note:

## 2024-02-22 NOTE — Interdisciplinary (Addendum)
 02/22/24 1033   Follow Up/Progress   Is the Patient Medically Stable for Discharge * Yes   Where was the patient admitted from? * Skilled Nursing Facility   Barriers to Discharge * Clinical reason   Anticipated Discharge Disposition/Needs * Hospice Community (Home,ILF,B&C)   Hospice facility status Accepted   Patient's Top 3 Discharge Facility Choices  [facility], [facility], [facility]   Family/Caregiver's Assessed for * Readiness, willingness, and ability to provide or support self-management activities   Respite Care * Not Applicable   Patient/Family/Other Are In Agreement With Discharge Plan * Yes   Public Health Clearance Needed * Not Applicable   Anticipated DischargeTransportation *  Ambulance     Pt and his sister Sharlet, would like to be discharged to a hospice home. Pt's sister spoke to [facility] yesterday and chose [facility]. CM connected with [facility] as they are visiting pt on bedside for consent signing. They are ready to accept pt today after pt signs consents. They have 2 beds available today and will arrange transport to their hospice home.   CM will continue to follow.   Tashi Band, RN, CM    Update - [facility] liaison met w/pt at bedside, and he signed consent with [facility] to go to their [facility] - [facility]. CM also called pt's sister Sharlet and message left for her regarding pt dc to hospice home for today.   Clovis Warwick, Rn, CM

## 2024-02-22 NOTE — Interdisciplinary (Signed)
 02/22/24 1118   Discharge Plan   Is the Patient Medically Stable for Discharge * Yes   CM Discharge Arrangements Complete Yes   Post Acute Services Referred To Hospice Community (Home, ILF, B&C)   Hospice community status Accepted   Patient's Top 3 Discharge Facility Choices  [facility] - [facility]   Final Discharge Transportation   Transportation*  Ambulance   Date 02/22/24   Transportation Company/Phone Number *  [facility]   Ambulance Meets Medical Necessity Yes   Discharge Transportation Details *  [facility] will confirm exact pickup time for this pt by [facility] ambulance.

## 2024-02-22 NOTE — Progress Notes (Signed)
 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), chronic back pain, recurrent cystitis w/ hematuria (s/p L nephrostomy tube), gout, HTN     Interval Events  Off norepi      Physical Examination:  Temperature:  [97.7 F (36.5 C)-98.6 F (37 C)] 98.6 F (37 C) (03/06 0800)  Blood pressure (BP): (92-142)/(48-91) 117/50 (03/06 1100)  Heart Rate:  [44-87] 68 (03/06 1100)  Respirations:  [11-29] 11 (03/06 1100)  Pain Score: Patient Sleeping, Respiratory Assessment Done (03/06 1100)  O2 Device: Nasal cannula (03/06 1100)  O2 Flow Rate (L/min):  [2 l/min-8 l/min] 4 l/min (03/06 1100)  SpO2:  [88 %-100 %] 100 % (03/06 1100)    FiO2 (%): 35 %      Intake/Output Summary (Last 24 hours) at 02/22/2024 1214  Last data filed at 02/22/2024 0800  Gross per 24 hour   Intake 1154.4 ml   Output 2250 ml   Net -1095.6 ml         GEN: Well developed, cachectic, in no acute distress  EYES: eomi, per, anicteric  ENT: dry mucous membranes, no discharge, hearing intact to conversation  RESP: no increased work of breathing and good aeration  CV: regular rate, regular rhythm, and no murmurs, no extra heart sounds  ABD: non-distended, no tenderness to palpation  EXTREM: no clubbing and no edema  NEURO: cnii-xii grossly intact, grossly normal sensation/strength/coordination  SKIN: no rashes or bruises on exposed skin        Labs:  reviewed; no new labs    Imaging:  reviewed; no new imaging    Diagnostics:  reviewed;         Assessment and Plan:  Ford Peddie is critically ill and admitted to the ICU with     Problems:  . Septic shock - due to pneumonia  . Pneumonia - due to aspiration  . Metastatic urothelial carcinoma - on erdafitnib without curative options  . NSTEMI - elevated troponins, new wall motion abnormalities, completed 72hrs of heparin  drip  . Variable arrhythmias -  due to above     Plans:  - transfer to hospice home for further care    Agitation/Analgesia: hydromorphone   Breathing: as above  Catheters: as below  Delirium: monitor  DVT/Mobilizaiton: heparin -100 Units,heparin -100 Units   Endo/Glucose: BG goal 140-180   Gut: diet - patient directed, npo, tube feeds holding due to diarrhea  Skin: per nursing   Family: updated  Code Status: DNAR - Full Care    Patient Lines/Drains/Airways Status       Active PICC Line / CVC Line / PIV Line / Drain / Airway / Intraosseous Line / Epidural Line / ART Line / Line Type / Wound / Pressure Ulcer Injury       Name Placement date Placement time Site Days    PICC Double Lumen - 02/15/24 Right Basilic 02/15/24  1130  Basilic  7    Port A Cath Single Lumen  - 02/13/24 Subclavian 02/13/24  0221  Subclavian  9    Nephrostomy -  Left 11/29/23  1151  Left  85    Stent -  Ureteral (left) 12/24/21  1102  Ureteral (left)  790    Pressure Ulcer/ Injury -  Stage I (non-blanchable reddness over pressure points) Coccyx 02/13/24  --  --  9                     allopurinol   100 mg Daily    aspirin   81 mg Daily    busPIRone   20 mg BID    famotidine   20 mg Daily    fluticasone  propionate  2 spray Daily    gabapentin   300 mg TID    ipratropium-albuterol   3 mL 4x Daily    midodrine   15 mg TID    multivitamin with iron-minerals  15 mL Daily    piperacillin -tazobactam (ZOSYN ) IVPB  4,500 mg Q8H    polyethylene glycol  17 g Daily    senna  17.2 mg HS    sodium chloride   10 mL Q8H    sodium chloride   3 mL Q8H    venlafaxine   50 mg BID      norepinephrine  8 mg in sodium chloride  0.9% 250 mL  0-50 mcg/min   Stopped at 02/21/24 1415    sodium chloride    10 mL/hr at 02/18/24 1500 Rate Verify at 02/18/24 1500      artificial tears  2 drop Q12H PRN    atropine   0.5 mg Once PRN    bisacodyl   10 mg Q8H PRN    diphenhydrAMINE   25 mg Q4H PRN    heparin   100 Units Once PRN    heparin   100 Units Once PRN    HYDROmorphone   1 mg Q4H PRN    HYDROmorphone   1 mg Q4H PRN     loperamide   2 mg Q6H PRN    nalOXone   0.1 mg Q2 Min PRN    prochlorperazine   10 mg Q6H PRN    prochlorperazine   10 mg Q6H PRN    sodium chloride   10 mL PRN    sodium chloride   3 mL PRN    sodium chloride    Continuous PRN    tizanidine   2 mg Q8H PRN    traZODone   50 mg Nightly PRN             Lang Beverley Benders, MD, MA

## 2024-02-26 ENCOUNTER — Ambulatory Visit: Payer: Medicare Other | Admitting: Family Medicine

## 2024-02-26 ENCOUNTER — Other Ambulatory Visit: Payer: Self-pay

## 2024-02-29 ENCOUNTER — Other Ambulatory Visit: Payer: Self-pay

## 2024-03-05 ENCOUNTER — Ambulatory Visit: Admitting: Nurse Practitioner

## 2024-03-05 VITALS — BP 118/60 | HR 50 | Temp 97.7°F | Ht 67.75 in | Wt 243.6 lb

## 2024-03-05 DIAGNOSIS — L03314 Cellulitis of groin: Secondary | ICD-10-CM | POA: Diagnosis not present

## 2024-03-05 MED ORDER — SULFAMETHOXAZOLE-TRIMETHOPRIM 800-160 MG PO TABS: 1.0000 | ORAL_TABLET | Freq: Two times a day (BID) | ORAL | 0 refills | Status: DC

## 2024-03-05 NOTE — Progress Notes (Signed)
 Acute Office Visit  Subjective:     Patient ID: Johnny Holland, male    DOB: [birth year], 73 y.o.   MRN: [id number]  Chief Complaint  Patient presents with   foreskin inflammation    Pt complains of swollen scrotum to foreskin that started a month ago. States of no pain. Pt complains that swollen foreskin has lasted longer than his herpes outbreak. Not sure if the two issues are related.  Pt states his foreskin is no longer sensitive and complains Sildenafil is not working well.     HPI Patient is in today for foreskin problems with a history of atherosclerosis, CAD, COPD, hepatic steatosis, Bells palsy, ED. Hx of herpes?   States that he noticed it approx 1 month aog. 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 As he has been less frequent with age. States that he would get a neuralgia in the past and then would get an eruption. States that this past time he got the neuralgia. It went from the left to the right after a few weeks. States that while that was happening the swelling came. States that it did effect his sex life. States that he had two days of a herpic outbreak approx 2 weeks ago and then resolved. The swelling is presistent. States that he was having some urine drops. He is unsure if it is discharge.  No injury or concern for STI   At the end of the visit patient did say that he was shaving himself and may have nicked the skin on that side Review of Systems  Constitutional:  Negative for chills and fever.  Respiratory:  Negative for shortness of breath.   Cardiovascular:  Negative for chest pain.  Gastrointestinal:  Negative for abdominal pain, constipation, diarrhea, nausea and vomiting.  Genitourinary:  Negative for dysuria, hematuria and urgency.        Objective:    BP 118/60   Pulse (!) 50   Temp 97.7 F (36.5 C) (Oral)   Ht 5' 7.75" (1.721 m)   Wt 243 lb 9.6 oz (110.5 kg)   SpO2 95%   BMI 37.31 kg/m  BP Readings from Last 3 Encounters:  03/05/24  118/60  05/10/23 134/66  02/16/23 138/80   Wt Readings from Last 3 Encounters:  03/05/24 243 lb 9.6 oz (110.5 kg)  05/10/23 221 lb 2 oz (100.3 kg)  02/16/23 224 lb 8 oz (101.8 kg)   SpO2 Readings from Last 3 Encounters:  03/05/24 95%  05/10/23 96%  02/16/23 97%      Physical Exam Vitals and nursing note reviewed. Exam conducted with a chaperone present Melina Copa, CMA).  Constitutional:      Appearance: Normal appearance.  Cardiovascular:     Rate and Rhythm: Normal rate and regular rhythm.     Heart sounds: Normal heart sounds.  Pulmonary:     Effort: Pulmonary effort is normal.     Breath sounds: Normal breath sounds.  Abdominal:     General: Bowel sounds are normal. There is no distension.     Palpations: There is no mass.     Tenderness: There is no abdominal tenderness.     Hernia: No hernia is present.  Genitourinary:      Comments: Redness and induration to the right inferior and anterior scrotum. Swelling noted. No pain  Neurological:     Mental Status: He is alert.     No results found for any visits on 03/05/24.  Assessment & Plan:   Problem List Items Addressed This Visit       Other   Cellulitis of groin - Primary   Concern for cellulitis of the scrotum.  Will have patient start Bactrim DS 1 tablet twice daily for 7 days.  No red flag symptoms in office.  Did offer to get ultrasound for courtesy of patient he politely declined.  Patient will follow-up with PCP in 7 to 10 days if no improvement consider getting ultrasound of scrotum.  Patient has no concern of STI nor any injury that he is aware of.      Relevant Medications   sulfamethoxazole-trimethoprim (BACTRIM DS) 800-160 MG tablet    Meds ordered this encounter  Medications   sulfamethoxazole-trimethoprim (BACTRIM DS) 800-160 MG tablet    Sig: Take 1 tablet by mouth 2 (two) times daily.    Dispense:  14 tablet    Refill:  0    Supervising Provider:   Roxy Manns A [[id number]]     Return in about 8 days (around 03/13/2024) for With DR G for Skin recheck .  Audria Nine, NP

## 2024-03-05 NOTE — Assessment & Plan Note (Signed)
 Concern for cellulitis of the scrotum.  Will have patient start Bactrim DS 1 tablet twice daily for 7 days.  No red flag symptoms in office.  Did offer to get ultrasound for courtesy of patient he politely declined.  Patient will follow-up with PCP in 7 to 10 days if no improvement consider getting ultrasound of scrotum.  Patient has no concern of STI nor any injury that he is aware of.

## 2024-03-05 NOTE — Patient Instructions (Signed)
 Nice to see you today I have sent medication to the pharmacy  Follow up with Dr. Reece Agar in 7-10 days

## 2024-03-13 ENCOUNTER — Ambulatory Visit: Admitting: Family Medicine

## 2024-03-13 ENCOUNTER — Encounter: Payer: Self-pay | Admitting: Family Medicine

## 2024-03-13 VITALS — BP 140/62 | HR 58 | Temp 98.1°F | Ht 67.75 in | Wt 236.5 lb

## 2024-03-13 DIAGNOSIS — N5089 Other specified disorders of the male genital organs: Secondary | ICD-10-CM | POA: Diagnosis not present

## 2024-03-13 MED ORDER — NYSTATIN 100000 UNIT/GM EX CREA
1.0000 | TOPICAL_CREAM | Freq: Two times a day (BID) | CUTANEOUS | 0 refills | Status: DC
Start: 2024-03-13 — End: 2024-05-24

## 2024-03-13 MED ORDER — FLUCONAZOLE 150 MG PO TABS: 150.0000 mg | ORAL_TABLET | ORAL | 0 refills | Status: AC

## 2024-03-13 NOTE — Assessment & Plan Note (Addendum)
 Anticipate due to scrotal candidiasis, started after trip to [employer] [place].  Recently treated for cellulitis, however not improved with bactrim course.  No open wound of skin.  Rx nystatin cream for penis/foreskin. Rx diflucan 150mg  weekly x 2 wks to cover candidiasis.  Update if not improving with treatment.  Not consistent with STI, UTI, fournier's gangrene.

## 2024-03-13 NOTE — Progress Notes (Signed)
 Ph: [phone] Fax: [phone]   Patient ID: Haywood Lasso, male    DOB: Jun 12, 1951, 73 y.o.   MRN: [id number]  This visit was conducted in person.  BP (!) 140/62 (BP Location: Right Arm, Cuff Size: Large)   Pulse (!) 58   Temp 98.1 F (36.7 C) (Oral)   Ht 5' 7.75" (1.721 m)   Wt 236 lb 8 oz (107.3 kg)   SpO2 93%   BMI 36.23 kg/m   BP Readings from Last 3 Encounters:  03/13/24 (!) 140/62  03/05/24 118/60  05/10/23 134/66    CC: f/u cellulitis visit  Subjective:   HPI: RODRIQUEZ THORNER is a 73 y.o. male presenting on 03/13/2024 for Cellulitis (Here for 8 day scrotum cellulitis. Took last dose of abx yesterday- not helpful. C/o ongoing swelling. )   Nicotine free since 09/2023!   H/o herpes flares - doesn't take medication. Notes paresthesias to different parts of body prior to flares.   Again had paresthesias to back but this time for several weeks.  Then developed herpes exacerbation to penis - very mild.  At same time developed swelling to foreskin of penis into R scrotum associated with decreased skin sensation.   Seen last week by Audria Nine with dx groin cellulitis treated with bactrim antibiotic.  No significant improvement with this.   No pain, itching, burning or stinging. No discharge.  No dysuria.  No fevers/chills, abd pain, nausea/vomiting.  No new sexual contacts.   Notes some urinary dribbling over last few years.   Episode may have started after trip to [facility] in [place].      Relevant past medical, surgical, family and social history reviewed and updated as indicated. Interim medical history since our last visit reviewed. Allergies and medications reviewed and updated. Outpatient Medications Prior to Visit  Medication Sig Dispense Refill   albuterol (PROVENTIL HFA;VENTOLIN HFA) 108 (90 Base) MCG/ACT inhaler Inhale 2 puffs into the lungs every 6 (six) hours as needed for wheezing or shortness of breath. 1 Inhaler 0   aspirin 81 MG  tablet Take 1 tablet (81 mg total) by mouth daily.     atorvastatin (LIPITOR) 40 MG tablet Take 1 tablet (40 mg total) by mouth daily. Please call to schedule 12 month follow up - [phone] 90 tablet 0   Multiple Vitamin (MULTI-VITAMIN DAILY PO) Take by mouth.     scopolamine (TRANSDERM-SCOP) 1 MG/3DAYS Place 1 patch (1.5 mg total) onto the skin every 3 (three) days. (Patient taking differently: Place 1 patch onto the skin every 3 (three) days. As needed) 10 patch 0   sildenafil (REVATIO) 20 MG tablet Take 2-5 tablets (40-100 mg total) by mouth daily as needed (relations). 30 tablet 3   nicotine (NICODERM CQ) 14 mg/24hr patch Place 1 patch (14 mg total) onto the skin daily. 28 patch 0   sulfamethoxazole-trimethoprim (BACTRIM DS) 800-160 MG tablet Take 1 tablet by mouth 2 (two) times daily. 14 tablet 0   No facility-administered medications prior to visit.     Per HPI unless specifically indicated in ROS section below Review of Systems  Objective:  BP (!) 140/62 (BP Location: Right Arm, Cuff Size: Large)   Pulse (!) 58   Temp 98.1 F (36.7 C) (Oral)   Ht 5' 7.75" (1.721 m)   Wt 236 lb 8 oz (107.3 kg)   SpO2 93%   BMI 36.23 kg/m   Wt Readings from Last 3 Encounters:  03/13/24 236 lb 8 oz (  107.3 kg)  03/05/24 243 lb 9.6 oz (110.5 kg)  05/10/23 221 lb 2 oz (100.3 kg)      Physical Exam Vitals and nursing note reviewed.  Constitutional:      Appearance: Normal appearance. He is not ill-appearing.  Genitourinary:    Pubic Area: Rash present.     Penis: Normal and uncircumcised. No phimosis, paraphimosis, erythema, tenderness or discharge.      Testes: Normal.        Right: Mass or tenderness not present.        Left: Mass or tenderness not present.       Comments:  Mild edema of foreskin but able to retract without phimosis Erythema with edema to anterior scrotal wall  Erythema to skin of inguinal region below abdomen, R to midline No spread of rash into  thighs Neurological:     Mental Status: He is alert.       Lab Results  Component Value Date   ALT 16 05/01/2023   AST 18 05/01/2023   ALKPHOS 85 05/01/2023   BILITOT 0.7 05/01/2023    Assessment & Plan:   Problem List Items Addressed This Visit     Scrotal edema - Primary   Anticipate due to scrotal candidiasis, started after trip to [employer] [place].  Recently treated for cellulitis, however not improved with bactrim course.  No open wound of skin.  Rx nystatin cream for penis/foreskin. Rx diflucan 150mg  weekly x 2 wks to cover candidiasis.  Update if not improving with treatment.  Not consistent with STI, UTI, fournier's gangrene.         Meds ordered this encounter  Medications   nystatin cream (MYCOSTATIN)    Sig: Apply 1 Application topically 2 (two) times daily.    Dispense:  30 g    Refill:  0   fluconazole (DIFLUCAN) 150 MG tablet    Sig: Take 1 tablet (150 mg total) by mouth once a week for 2 doses.    Dispense:  2 tablet    Refill:  0    No orders of the defined types were placed in this encounter.   Patient Instructions  Possible yeast infection.  Treat with nystatin cream sent to pharmacy to penis/foreskin Take diflucan antifungal 150mg  tablets sent to pharmacy. Let me know if not improved with this.   Follow up plan: Return if symptoms worsen or fail to improve.  Eustaquio Boyden, MD

## 2024-03-13 NOTE — Patient Instructions (Signed)
 Possible yeast infection.  Treat with nystatin cream sent to pharmacy to penis/foreskin Take diflucan antifungal 150mg  tablets sent to pharmacy. Let me know if not improved with this.

## 2024-03-14 ENCOUNTER — Ambulatory Visit
Admission: RE | Admit: 2024-03-14 | Payer: Medicare Other | Source: Ambulatory Visit | Admitting: Vascular & Interventional Radiology

## 2024-03-14 ENCOUNTER — Encounter (HOSPITAL_BASED_OUTPATIENT_CLINIC_OR_DEPARTMENT_OTHER): Admission: RE | Payer: Self-pay | Source: Ambulatory Visit

## 2024-03-14 DIAGNOSIS — C642 Malignant neoplasm of left kidney, except renal pelvis: Secondary | ICD-10-CM | POA: Insufficient documentation

## 2024-03-14 SURGERY — IR CHNG NEPHROSTOMY TUBE
Anesthesia: Local | Laterality: Left

## 2024-03-19 DEATH — deceased

## 2024-03-25 ENCOUNTER — Encounter: Payer: Self-pay | Admitting: Family Medicine

## 2024-03-25 DIAGNOSIS — N5089 Other specified disorders of the male genital organs: Secondary | ICD-10-CM

## 2024-03-28 ENCOUNTER — Ambulatory Visit

## 2024-03-29 NOTE — Telephone Encounter (Signed)
 Was unable to reach patient.

## 2024-04-10 ENCOUNTER — Other Ambulatory Visit: Payer: Self-pay | Admitting: Cardiovascular Disease

## 2024-04-10 NOTE — Telephone Encounter (Signed)
 Hi,  Could someone schedule this patient a 12 month follow up visit? The patient was last seen by Dr. Alvenia Aus on 02-16-2023. Thank you so much.

## 2024-04-19 ENCOUNTER — Ambulatory Visit: Admitting: Cardiology

## 2024-04-26 ENCOUNTER — Other Ambulatory Visit: Payer: Medicare Other

## 2024-04-29 ENCOUNTER — Ambulatory Visit: Attending: Cardiology | Admitting: Cardiology

## 2024-04-29 ENCOUNTER — Encounter: Payer: Self-pay | Admitting: Cardiology

## 2024-04-29 ENCOUNTER — Telehealth: Payer: Self-pay | Admitting: Cardiology

## 2024-04-29 VITALS — BP 130/66 | HR 54 | Ht 68.0 in | Wt 238.2 lb

## 2024-04-29 DIAGNOSIS — E785 Hyperlipidemia, unspecified: Secondary | ICD-10-CM | POA: Diagnosis not present

## 2024-04-29 DIAGNOSIS — I359 Nonrheumatic aortic valve disorder, unspecified: Secondary | ICD-10-CM

## 2024-04-29 DIAGNOSIS — I714 Abdominal aortic aneurysm, without rupture, unspecified: Secondary | ICD-10-CM

## 2024-04-29 DIAGNOSIS — R001 Bradycardia, unspecified: Secondary | ICD-10-CM

## 2024-04-29 DIAGNOSIS — I2510 Atherosclerotic heart disease of native coronary artery without angina pectoris: Secondary | ICD-10-CM | POA: Diagnosis not present

## 2024-04-29 MED ORDER — ATORVASTATIN CALCIUM 40 MG PO TABS: 40.0000 mg | ORAL_TABLET | Freq: Every day | ORAL | 3 refills | Status: DC

## 2024-04-29 MED ORDER — ATORVASTATIN CALCIUM 40 MG PO TABS: 40.0000 mg | ORAL_TABLET | Freq: Every day | ORAL | 3 refills | Status: AC

## 2024-04-29 NOTE — Progress Notes (Signed)
 Cardiology Office Note:  .   Date:  04/29/2024  ID:  [name], DOB [birth year], MRN [id number] PCP: Claire Crick, MD  [facility] Providers Cardiologist:  Antionette Kirks, MD    History of Present Illness: .   Johnny Holland is a 73 y.o. male with a past medical history of nonobstructive coronary artery disease, aortic valve disease, COPD, tobacco use, hyperlipidemia, Bell's palsy, diverticulosis without diverticulitis symptoms, fatty liver disease, obesity, erectile dysfunction, is here today to follow-up on his nonobstructive coronary artery disease.   Prior echocardiogram completed in April 2019 showed normal LV systolic function, G2 DD, mildly calcified aortic valve without significant stenosis, mild to moderate aortic regurgitation, and no evidence of pulmonary hypertension.  Nuclear stress test completed in 2019 showed small apical defect felt to represent artifact with normal ejection fraction.  Most recent echocardiogram in June 2020 showed EF 60 to 65%, moderately calcified aortic valve with mild aortic insufficiency and stenosis.  CTA of the coronary arteries in August 2020 showed a calcium  score of 580, nondominant RCA with 50% proximal stenosis and moderate proximal LAD stenosis.  No significant lesions by FFR.   He was last seen in clinic 02/16/2023 by Dr. Alvenia Aus.  At that time he had been doing well with no complaints from cardiac perspective.  Stated he had quit smoking in 2011 but continued to vape.  Denied any hospitalizations or visits to the emergency department.  He was scheduled for follow-up echocardiogram but there were no medication changes that were made.  He was also requested for follow-up ultrasound in June for the small abdominal aortic aneurysm that was previously noted.  He returns to clinic today stating that overall he has been doing well from the cardiac perspective.  He denies any chest pain, chest tightness, or palpitations.  He has occasional  shortness of breath.  He had stop smoking altogether last a year and has stopped vaping.  He stated that when he stopped that his breathing almost immediately improved.  He states that he has been compliant with his current medication regimen without side effects.  Denies any hospitalizations or visits to the emergency department.  ROS: 10 point review of systems has been reviewed and considered negative except ones been listed in the HPI  Studies Reviewed: Aaron Aas   EKG Interpretation Date/Time:  Monday Apr 29 2024 10:10:09 EDT Ventricular Rate:  54 PR Interval:  158 QRS Duration:  88 QT Interval:  432 QTC Calculation: 409 R Axis:   -36  Text Interpretation: Sinus bradycardia Left axis deviation Septal infarct (cited on or before 03-Dec-2018) When compared with ECG of 31-Dec-2021 10:17, No significant change was found Confirmed by Ronald Cockayne ([id number]) on 04/29/2024 10:12:25 AM    AAA Duplex 06/06/2023 Summary:  Abdominal Aorta: No evidence of an abdominal aortic aneurysm was  visualized. There is evidence of abnormal dilatation of the mid Abdominal  aorta. The largest aortic measurement is 3.2 cm. The largest aortic  diameter remains essentially unchanged compared   to prior exam. Previous diameter measurement was 3.4 cm obtained on  02/22/22.   2D echo 06/06/2023 1. Left ventricular ejection fraction, by estimation, is 55 to 60%. The  left ventricle has normal function. The left ventricle has no regional  wall motion abnormalities. Left ventricular diastolic parameters are  consistent with Grade I diastolic  dysfunction (impaired relaxation).   2. Right ventricular systolic function is normal. The right ventricular  size is normal.   3. Left atrial size  was mildly dilated.   4. The mitral valve is normal in structure. No evidence of mitral valve  regurgitation. No evidence of mitral stenosis. Moderate mitral annular  calcification.   5. The aortic valve has an indeterminant number of  cusps. There is  moderate calcification of the aortic valve. Aortic valve regurgitation is  mild to moderate. No aortic stenosis is present. Aortic valve mean  gradient measures 12.5 mmHg.   6. There is borderline dilatation of the aortic root, measuring 37 mm.   7. The inferior vena cava is normal in size with greater than 50%  respiratory variability, suggesting right atrial pressure of 3 mmHg.   Risk Assessment/Calculations:             Physical Exam:   VS:  BP 130/66 (BP Location: Left Arm, Patient Position: Sitting, Cuff Size: Normal)   Pulse (!) 54   Ht 5\' 8"  (1.727 m)   Wt 238 lb 3.2 oz (108 kg)   SpO2 96%   BMI 36.22 kg/m    Wt Readings from Last 3 Encounters:  04/29/24 238 lb 3.2 oz (108 kg)  03/13/24 236 lb 8 oz (107.3 kg)  03/05/24 243 lb 9.6 oz (110.5 kg)    GEN: Well nourished, well developed in no acute distress NECK: No JVD; No carotid bruits, heart murmur radiates into the bilateral carotids CARDIAC: RRR, II/VI systolic murmur RUSB without rubs or gallops RESPIRATORY:  Clear to auscultation without rales, wheezing or rhonchi  ABDOMEN: Soft, non-tender, non-distended EXTREMITIES: Trace pretibial edema; No deformity   ASSESSMENT AND PLAN: .   Coronary artery disease involving native coronary arteries without angina.  Patient has noted to have mild to moderate nonobstructive coronary artery disease on previous CTA.  EKG today revealed sinus bradycardia with rate of 54 with left axis deviation and old septal infarct with no acute changes.  He is continued on aspirin  81 mg daily and atorvastatin  40 mg daily.  No further ischemic testing is needed at this time.  Aortic valve disease with mild to moderate aortic regurgitation.  No aortic stenosis noted on last study but mild aortic stenosis noted on echocardiogram from 2022.  Heart murmur noticed on exam similar to prior examination.  He has been scheduled for an updated echocardiogram in June of this year to reevaluate  valvular disease.  Mixed hyperlipidemia where he is continued on atorvastatin  40 mg daily.  He is requesting refills today.  He has upcoming labs with his PCP next week.  Last LDL was 74.  Abdominal aortic aneurysm measuring 3.2 cm on last ultrasound.  He is scheduled for an updated ultrasound in June of this year.  Sinus bradycardia with rate of 54 with no acute changes noted on EKG today.  Patient is currently not on any AV nodal blocking agents.  Will continue to monitor with surveillance studies.  He continues to remain asymptomatic.       Dispo: Patient return to clinic to see MD/APP in 11 to 12 months or sooner if needed for reevaluation.  Signed, Markevious Ehmke, NP

## 2024-04-29 NOTE — Patient Instructions (Signed)
 Medication Instructions:  No changes at this time.   *If you need a refill on your cardiac medications before your next appointment, please call your pharmacy*  Lab Work: None  If you have labs (blood work) drawn today and your tests are completely normal, you will receive your results only by: MyChart Message (if you have MyChart) OR A paper copy in the mail If you have any lab test that is abnormal or we need to change your treatment, we will call you to review the results.  Testing/Procedures: Your physician has requested that you have an echocardiogram. Echocardiography is a painless test that uses sound waves to create images of your heart. It provides your doctor with information about the size and shape of your heart and how well your heart's chambers and valves are working. This procedure takes approximately one hour. There are no restrictions for this procedure. Please do NOT wear cologne, perfume, aftershave, or lotions (deodorant is allowed). Please arrive 15 minutes prior to your appointment time.  Please note: We ask at that you not bring children with you during ultrasound (echo/ vascular) testing. Due to room size and safety concerns, children are not allowed in the ultrasound rooms during exams. Our front office staff cannot provide observation of children in our lobby area while testing is being conducted. An adult accompanying a patient to their appointment will only be allowed in the ultrasound room at the discretion of the ultrasound technician under special circumstances. We apologize for any inconvenience.  Your physician has requested that you have an abdominal aorta duplex. During this test, an ultrasound is used to evaluate the aorta. Allow 30 minutes for this exam. Do not eat after midnight the day before and avoid carbonated beverages. This will take place at [address] [address], [place] [address]   Follow-Up: At [facility],  you and your health needs are our priority.  As part of our continuing mission to provide you with exceptional heart care, our providers are all part of one team.  This team includes your primary Cardiologist (physician) and Advanced Practice Providers or APPs (Physician Assistants and Nurse Practitioners) who all work together to provide you with the care you need, when you need it.  Your next appointment:   1 year(s)  Provider:   Antionette Kirks, MD or Ronald Cockayne, NP

## 2024-04-29 NOTE — Telephone Encounter (Signed)
  Pt trying to schedule AAA test but wanted to schedule it on the same day with his echo, however, no vascular schedule available after 06/11

## 2024-05-03 ENCOUNTER — Encounter: Payer: Medicare Other | Admitting: Family Medicine

## 2024-05-06 ENCOUNTER — Other Ambulatory Visit: Payer: Medicare Other

## 2024-05-10 ENCOUNTER — Encounter: Payer: Medicare Other | Admitting: Family Medicine

## 2024-05-10 NOTE — Addendum Note (Signed)
 Addended by: Claire Crick on: 05/10/2024 08:27 AM   Modules accepted: Orders

## 2024-05-13 ENCOUNTER — Other Ambulatory Visit: Payer: Self-pay | Admitting: Family Medicine

## 2024-05-13 DIAGNOSIS — Z125 Encounter for screening for malignant neoplasm of prostate: Secondary | ICD-10-CM

## 2024-05-13 DIAGNOSIS — E7800 Pure hypercholesterolemia, unspecified: Secondary | ICD-10-CM

## 2024-05-13 DIAGNOSIS — N5089 Other specified disorders of the male genital organs: Secondary | ICD-10-CM

## 2024-05-13 DIAGNOSIS — R7303 Prediabetes: Secondary | ICD-10-CM

## 2024-05-15 ENCOUNTER — Other Ambulatory Visit: Payer: Self-pay | Admitting: Acute Care

## 2024-05-15 ENCOUNTER — Ambulatory Visit: Payer: Self-pay | Admitting: Cardiology

## 2024-05-15 ENCOUNTER — Ambulatory Visit: Attending: Cardiology

## 2024-05-15 DIAGNOSIS — Z87891 Personal history of nicotine dependence: Secondary | ICD-10-CM

## 2024-05-15 DIAGNOSIS — Z122 Encounter for screening for malignant neoplasm of respiratory organs: Secondary | ICD-10-CM

## 2024-05-15 DIAGNOSIS — I714 Abdominal aortic aneurysm, without rupture, unspecified: Secondary | ICD-10-CM

## 2024-05-15 NOTE — Progress Notes (Signed)
 Stable abdominal aortic aneurysm measuring at 3.3 cm.  Measured 3.2 cm in June 2024.  Recommend repeat study in 1 year.  Continue current medication regimen without changes at this time.

## 2024-05-16 ENCOUNTER — Ambulatory Visit
Admission: RE | Admit: 2024-05-16 | Discharge: 2024-05-16 | Disposition: A | Discharge status: Home / Self Care | Source: Ambulatory Visit | Attending: Acute Care | Admitting: Acute Care

## 2024-05-16 ENCOUNTER — Other Ambulatory Visit (INDEPENDENT_AMBULATORY_CARE_PROVIDER_SITE_OTHER)

## 2024-05-16 ENCOUNTER — Other Ambulatory Visit

## 2024-05-16 DIAGNOSIS — Z87891 Personal history of nicotine dependence: Secondary | ICD-10-CM | POA: Diagnosis present

## 2024-05-16 DIAGNOSIS — Z125 Encounter for screening for malignant neoplasm of prostate: Secondary | ICD-10-CM

## 2024-05-16 DIAGNOSIS — Z122 Encounter for screening for malignant neoplasm of respiratory organs: Secondary | ICD-10-CM | POA: Insufficient documentation

## 2024-05-16 DIAGNOSIS — R7303 Prediabetes: Secondary | ICD-10-CM | POA: Diagnosis not present

## 2024-05-16 DIAGNOSIS — E7800 Pure hypercholesterolemia, unspecified: Secondary | ICD-10-CM | POA: Diagnosis not present

## 2024-05-16 DIAGNOSIS — N5089 Other specified disorders of the male genital organs: Secondary | ICD-10-CM | POA: Diagnosis not present

## 2024-05-16 LAB — CBC WITH DIFFERENTIAL/PLATELET
Basophils Absolute: 0 10*3/uL (ref 0.0–0.1)
Basophils Relative: 0.6 % (ref 0.0–3.0)
Eosinophils Absolute: 0.2 10*3/uL (ref 0.0–0.7)
Eosinophils Relative: 3.1 % (ref 0.0–5.0)
HCT: 42.4 % (ref 39.0–52.0)
Hemoglobin: 14.2 g/dL (ref 13.0–17.0)
Lymphocytes Relative: 25.3 % (ref 12.0–46.0)
Lymphs Abs: 1.9 10*3/uL (ref 0.7–4.0)
MCHC: 33.5 g/dL (ref 30.0–36.0)
MCV: 86.7 fl (ref 78.0–100.0)
Monocytes Absolute: 0.8 10*3/uL (ref 0.1–1.0)
Monocytes Relative: 10.2 % (ref 3.0–12.0)
Neutro Abs: 4.5 10*3/uL (ref 1.4–7.7)
Neutrophils Relative %: 60.8 % (ref 43.0–77.0)
Platelets: 194 10*3/uL (ref 150.0–400.0)
RBC: 4.89 Mil/uL (ref 4.22–5.81)
RDW: 13.7 % (ref 11.5–15.5)
WBC: 7.4 10*3/uL (ref 4.0–10.5)

## 2024-05-16 LAB — COMPREHENSIVE METABOLIC PANEL WITH GFR
ALT: 24 U/L (ref 0–53)
AST: 20 U/L (ref 0–37)
Albumin: 3.9 g/dL (ref 3.5–5.2)
Alkaline Phosphatase: 89 U/L (ref 39–117)
BUN: 12 mg/dL (ref 6–23)
CO2: 28 meq/L (ref 19–32)
Calcium: 9.2 mg/dL (ref 8.4–10.5)
Chloride: 104 meq/L (ref 96–112)
Creatinine, Ser: 1 mg/dL (ref 0.40–1.50)
GFR: 76.27 mL/min (ref >60.00)
Glucose, Bld: 99 mg/dL (ref 70–99)
Potassium: 4.1 meq/L (ref 3.5–5.1)
Sodium: 137 meq/L (ref 135–145)
Total Bilirubin: 0.8 mg/dL (ref 0.2–1.2)
Total Protein: 6.8 g/dL (ref 6.0–8.3)

## 2024-05-16 LAB — LIPID PANEL
Cholesterol: 152 mg/dL (ref 0–200)
HDL: 51.9 mg/dL (ref >39.00)
LDL Cholesterol: 79 mg/dL (ref 0–99)
NonHDL: 100.58
Total CHOL/HDL Ratio: 3
Triglycerides: 108 mg/dL (ref 0.0–149.0)
VLDL: 21.6 mg/dL (ref 0.0–40.0)

## 2024-05-16 LAB — HEMOGLOBIN A1C: Hgb A1c MFr Bld: 6 % (ref 4.6–6.5)

## 2024-05-16 LAB — PSA: PSA: 3.07 ng/mL (ref 0.10–4.00)

## 2024-05-17 ENCOUNTER — Ambulatory Visit: Payer: Self-pay | Admitting: Family Medicine

## 2024-05-22 ENCOUNTER — Encounter: Payer: Self-pay | Admitting: Family Medicine

## 2024-05-24 ENCOUNTER — Encounter: Payer: Self-pay | Admitting: Family Medicine

## 2024-05-24 ENCOUNTER — Ambulatory Visit: Payer: Self-pay | Admitting: Family Medicine

## 2024-05-24 ENCOUNTER — Ambulatory Visit: Admitting: Family Medicine

## 2024-05-24 ENCOUNTER — Ambulatory Visit
Admission: RE | Admit: 2024-05-24 | Discharge: 2024-05-24 | Disposition: A | Discharge status: Home / Self Care | Source: Ambulatory Visit | Attending: Family Medicine | Admitting: Family Medicine

## 2024-05-24 VITALS — BP 148/70 | HR 59 | Temp 98.6°F | Ht 67.5 in | Wt 237.5 lb

## 2024-05-24 DIAGNOSIS — R339 Retention of urine, unspecified: Secondary | ICD-10-CM

## 2024-05-24 DIAGNOSIS — K760 Fatty (change of) liver, not elsewhere classified: Secondary | ICD-10-CM

## 2024-05-24 DIAGNOSIS — M25551 Pain in right hip: Secondary | ICD-10-CM | POA: Diagnosis not present

## 2024-05-24 DIAGNOSIS — I359 Nonrheumatic aortic valve disorder, unspecified: Secondary | ICD-10-CM

## 2024-05-24 DIAGNOSIS — Z87891 Personal history of nicotine dependence: Secondary | ICD-10-CM

## 2024-05-24 DIAGNOSIS — Z720 Tobacco use: Secondary | ICD-10-CM

## 2024-05-24 DIAGNOSIS — I6529 Occlusion and stenosis of unspecified carotid artery: Secondary | ICD-10-CM

## 2024-05-24 DIAGNOSIS — I2510 Atherosclerotic heart disease of native coronary artery without angina pectoris: Secondary | ICD-10-CM

## 2024-05-24 DIAGNOSIS — R7303 Prediabetes: Secondary | ICD-10-CM

## 2024-05-24 DIAGNOSIS — N529 Male erectile dysfunction, unspecified: Secondary | ICD-10-CM

## 2024-05-24 DIAGNOSIS — Z7189 Other specified counseling: Secondary | ICD-10-CM

## 2024-05-24 DIAGNOSIS — N5089 Other specified disorders of the male genital organs: Secondary | ICD-10-CM

## 2024-05-24 DIAGNOSIS — E7800 Pure hypercholesterolemia, unspecified: Secondary | ICD-10-CM

## 2024-05-24 DIAGNOSIS — J432 Centrilobular emphysema: Secondary | ICD-10-CM

## 2024-05-24 DIAGNOSIS — Z0000 Encounter for general adult medical examination without abnormal findings: Secondary | ICD-10-CM | POA: Diagnosis not present

## 2024-05-24 DIAGNOSIS — I7143 Infrarenal abdominal aortic aneurysm, without rupture: Secondary | ICD-10-CM

## 2024-05-24 LAB — POC URINALSYSI DIPSTICK (AUTOMATED)
Bilirubin, UA: NEGATIVE
Blood, UA: NEGATIVE
Glucose, UA: NEGATIVE
Ketones, UA: NEGATIVE
Leukocytes, UA: NEGATIVE
Nitrite, UA: NEGATIVE
Protein, UA: NEGATIVE
Spec Grav, UA: 1.025 (ref 1.010–1.025)
Urobilinogen, UA: 0.2 U/dL
pH, UA: 5.5 (ref 5.0–8.0)

## 2024-05-24 NOTE — Patient Instructions (Addendum)
 Right hip xray today  Urinalysis today. Cut down on dark sodas, caffeine, spicy foods which can all irritate the bladder.  Incorporate walking into routine. Goal 150 mins/wk of moderate intensity aerobic exercise.  Good to see you today.  Congratulations on stopping vaping! Return as needed or in 1 year for next physical

## 2024-05-24 NOTE — Progress Notes (Signed)
 Ph: [phone] Fax: [phone]   Patient ID: Johnny Holland, male    DOB: [birth year], 73 y.o.   MRN: [id number]  This visit was conducted in person.  BP (!) 148/70   Pulse (!) 59   Temp 98.6 F (37 C) (Oral)   Ht 5' 7.5" (1.715 m)   Wt 237 lb 8 oz (107.7 kg)   SpO2 93%   BMI 36.65 kg/m   BP Readings from Last 3 Encounters:  05/24/24 (!) 148/70  04/29/24 130/66  03/13/24 (!) 140/62   CC: CPE/AMW Subjective:   HPI: Johnny Holland is a 73 y.o. male presenting on 05/24/2024 for Medicare Wellness   Did not see health advisor this year.   Hearing Screening - Comments:: Wears B hearing aids. Wearing at today's OV.  Vision Screening - Comments:: Last eye exam, fall 2024.  Flowsheet Row Office Visit from 05/24/2024 in [name] [place]  PHQ-2 Total Score 0          05/24/2024    3:40 PM 03/05/2024   11:26 AM 04/29/2022   10:31 AM 03/06/2020    3:23 PM  Fall Risk   Falls in the past year? 0 0 1 0  Number falls in past yr:  0 0   Injury with Fall?  0 0   Risk for fall due to :  No Fall Risks    Follow up  Falls evaluation completed     AAA with known CAD and aortic stenosis - sees cardiology yearly Johnny Holland). Pending updated echo 05/2024.    Scrotal swelling present for almost 3 months now. Initially treated for cellulitis with bactrim  antibiotic without benefit. On re evaluation, thought scrotal candidiasis that started after trip to [facility] - treated with diflucan  150mg  weekly x 2wks, rec nystatin  cream to penis. No significant improvement with this either. Upcoming urology appt 06/17/2024. Ongoing swelling. No rash. Notes ongoing dribbling after urinating.  He drinks a lot of soda - diet dr pepper.   R lateral hip pain ongoing for the past few months. Also notes chronic R groin pain ongoing for years. No bulge to groin. Feels he constantly aggravates this pain. Denies inciting trauma injury or falls.   Wears hearing aides.     Preventative: COLONOSCOPY WITH PROPOFOL  04/13/2020 - TA x2, divrticulosis, rpt 5 yrs (Vanga, Elson Halon, MD) Prostate cancer screening - yearly Lung cancer screening - continue yearly CTs - latest completed, read pending  Flu shot - declined COVID - declined  Td - 2007, Tdap 2018  Prevnar-13 02/2019, pneumovax 04/2022 Zostavax - 2014  Shingrix - 08/2017, 12/2017  Advanced directives- brought and scanned 02/2021. Wife Johnny Holland and son Johnny Holland are HCPOA. Does not want prolonged life support if terminal condition. Does want both artificial hydration and nutrition however. HCPOA can override advance directive.  Seat belt use discussed  Sunscreen use discussed. Sees derm regularly. 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  Ex smoker - quit smoking 2011 prior 1-2 ppd (42+ PY history). 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 Interim medical history since our last visit reviewed. Allergies and medications reviewed and updated. Outpatient Medications Prior to Visit  Medication Sig Dispense Refill   aspirin  81 MG tablet Take 1 tablet (81 mg total) by mouth daily.     atorvastatin  (LIPITOR) 40 MG tablet Take 1 tablet (40 mg total) by mouth daily. 90 tablet 3   Multiple Vitamin (MULTI-VITAMIN DAILY PO) Take by mouth.     scopolamine  (TRANSDERM-SCOP) 1 MG/3DAYS  Place 1 patch (1.5 mg total) onto the skin every 3 (three) days. (Patient taking differently: Place 1 patch onto the skin every 3 (three) days. As needed) 10 patch 0   sildenafil  (REVATIO ) 20 MG tablet Take 2-5 tablets (40-100 mg total) by mouth daily as needed (relations). 30 tablet 3   albuterol  (PROVENTIL  HFA;VENTOLIN  HFA) 108 (90 Base) MCG/ACT inhaler Inhale 2 puffs into the lungs every 6 (six) hours as needed for wheezing or shortness of breath. 1 Inhaler 0   nystatin  cream (MYCOSTATIN ) Apply 1 Application topically 2 (two) times daily. 30 g 0   No facility-administered medications prior to visit.     Per HPI unless specifically indicated in ROS section below Review of Systems  Constitutional:  Negative for activity change, appetite change, chills, fatigue, fever and unexpected weight change.  HENT:  Negative for hearing loss.   Eyes:  Negative for visual disturbance.  Respiratory:  Positive for wheezing (occ, improved after quitting vaping). Negative for cough, chest tightness and shortness of breath.   Cardiovascular:  Positive for leg swelling (at ankles). Negative for chest pain and palpitations.  Gastrointestinal:  Negative for abdominal distention, abdominal pain, blood in stool, constipation, diarrhea, nausea and vomiting.  Genitourinary:  Negative for difficulty urinating and hematuria.  Musculoskeletal:  Negative for arthralgias, myalgias and neck pain.  Skin:  Negative for rash.  Neurological:  Negative for dizziness, seizures, syncope and headaches.  Hematological:  Negative for adenopathy. Does not bruise/bleed easily.  Psychiatric/Behavioral:  Negative for dysphoric mood. The patient is not nervous/anxious.     Objective:  BP (!) 148/70   Pulse (!) 59   Temp 98.6 F (37 C) (Oral)   Ht 5' 7.5" (1.715 m)   Wt 237 lb 8 oz (107.7 kg)   SpO2 93%   BMI 36.65 kg/m   Wt Readings from Last 3 Encounters:  05/24/24 237 lb 8 oz (107.7 kg)  04/29/24 238 lb 3.2 oz (108 kg)   03/13/24 236 lb 8 oz (107.3 kg)      Physical Exam Vitals and nursing note reviewed.  Constitutional:      General: He is not in acute distress.    Appearance: Normal appearance. He is well-developed. He is not ill-appearing.  HENT:     Head: Normocephalic and atraumatic.     Right Ear: Hearing, tympanic membrane, ear canal and external ear normal.     Left Ear: Hearing, tympanic membrane, ear canal and external ear normal.     Mouth/Throat:     Mouth: Mucous membranes are moist.     Pharynx: Oropharynx is clear. No oropharyngeal exudate or posterior oropharyngeal erythema.  Eyes:     General: No scleral icterus.    Extraocular Movements: Extraocular movements intact.     Conjunctiva/sclera: Conjunctivae normal.     Pupils: Pupils  are equal, round, and reactive to light.  Neck:     Thyroid : No thyroid  mass or thyromegaly.     Vascular: Carotid bruit (bilateral) present.  Cardiovascular:     Rate and Rhythm: Normal rate and regular rhythm.     Pulses: Normal pulses.          Radial pulses are 2+ on the right side and 2+ on the left side.     Heart sounds: Murmur (4/6 systolic USB with radiation to carotids) heard.  Pulmonary:     Effort: Pulmonary effort is normal. No respiratory distress.     Breath sounds: Normal breath sounds. No wheezing, rhonchi or rales.  Abdominal:     General: Bowel sounds are normal. There is no distension.     Palpations: Abdomen is soft. There is no mass.     Tenderness: There is no abdominal tenderness. There is no guarding or rebound.     Hernia: No hernia is present.  Musculoskeletal:        General: Normal range of motion.     Cervical back: Normal range of motion and neck supple.     Right lower leg: Edema (tr) present.     Left lower leg: Edema (tr) present.     Comments:  Neg SLR bilaterally. ++ pain with int rotation at R hip. No pain at SIJ, GTB or sciatic notch bilaterally.   Lymphadenopathy:     Cervical: No cervical adenopathy.   Skin:    General: Skin is warm and dry.     Findings: No rash.  Neurological:     General: No focal deficit present.     Mental Status: He is alert and oriented to person, place, and time.     Comments:  Recall 3/3  Calculation 5/5 DRLOW  Psychiatric:        Mood and Affect: Mood normal.        Behavior: Behavior normal.        Thought Content: Thought content normal.        Judgment: Judgment normal.       Results for orders placed or performed in visit on 05/24/24  POCT Urinalysis Dipstick (Automated)   Collection Time: 05/24/24  4:27 PM  Result Value Ref Range   Color, UA yellow    Clarity, UA clear    Glucose, UA Negative Negative   Bilirubin, UA negative    Ketones, UA negative    Spec Grav, UA 1.025 1.010 - 1.025   Blood, UA negative    pH, UA 5.5 5.0 - 8.0   Protein, UA Negative Negative   Urobilinogen, UA 0.2 0.2 or 1.0 E.U./dL   Nitrite, UA negative    Leukocytes, UA Negative Negative   Lab Results  Component Value Date   CHOL 152 05/16/2024   HDL 51.90 05/16/2024   LDLCALC 79 05/16/2024   LDLDIRECT 65 04/10/2020   TRIG 108.0 05/16/2024   CHOLHDL 3 05/16/2024    Lab Results  Component Value Date   NA 137 05/16/2024   CL 104 05/16/2024   K 4.1 05/16/2024   CO2 28 05/16/2024   BUN 12 05/16/2024   CREATININE 1.00 05/16/2024   GFR 76.27 05/16/2024   CALCIUM  9.2 05/16/2024   ALBUMIN 3.9 05/16/2024   GLUCOSE 99 05/16/2024    Lab Results  Component Value Date   WBC 7.4 05/16/2024   HGB 14.2 05/16/2024   HCT 42.4 05/16/2024   MCV 86.7 05/16/2024   PLT 194.0 05/16/2024  Lab Results  Component Value Date   HGBA1C 6.0 05/16/2024    Assessment & Plan:   Problem List Items Addressed This Visit     Medicare annual wellness visit, subsequent - Primary (Chronic)   I have personally reviewed the Medicare Annual Wellness questionnaire and have noted 1. The patient's medical and social history 2. Their use of alcohol, tobacco or illicit  drugs 3. Their current medications and supplements 4. The patient's functional ability including ADL's, fall risks, home safety risks and hearing or visual impairment. Cognitive function has been assessed and addressed as indicated.  5. Diet and physical activity 6. Evidence for depression or mood disorders The patients weight, height, BMI have been recorded in the chart. I have made referrals, counseling and provided education to the patient based on review of the above and I have provided the pt with a written personalized care plan for preventive services. Provider list updated.. See scanned questionairre as needed for further documentation. Reviewed preventative protocols and updated unless pt declined.       Advanced directives, counseling/discussion (Chronic)   Previously discussed.       Health maintenance examination (Chronic)   Preventative protocols reviewed and updated unless pt declined. Discussed healthy diet and lifestyle.       Personal history of tobacco use, presenting hazards to health   Quit vaping 01/2024. Continue lung cancer screening.       HLD (hyperlipidemia)   Chronic, stable period on atorvastatin  40mg  daily with non-HDL 100 - continue. The 10-year ASCVD risk score (Arnett DK, et al., 2019) is: 19.6%   Values used to calculate the score:     Age: 61 years     Sex: Male     Is Non-Hispanic African American: No     Diabetic: No     Tobacco smoker: No     Systolic Blood Pressure: 148 mmHg     Is BP treated: No     HDL Cholesterol: 51.9 mg/dL     Total Cholesterol: 152 mg/dL       Aortic valve disease   Mild murmur, stable.  Pending updated echo 2025      Carotid stenosis   Minimal on prior imaging 2017. Continue to monitor.       Severe obesity (BMI 35.0-39.9) with comorbidity (HCC)   Discussed weight gain noted after quitting vaping Encouraged healthy diet and lifestyle choices to affect sustainable weight loss       Prediabetes    Encouraged limiting added sugars in diet.       Erectile dysfunction   Continue PRN sildenafil       CAD (coronary artery disease)   Continue aspirin , statin      COPD (chronic obstructive pulmonary disease) (HCC)   Noted on imaging, pt overall asxs off med.       Hepatic steatosis   Right hip pain   Anticipate OA related pain - update hip films today      Relevant Orders   DG HIP UNILAT W OR W/O PELVIS 2-3 VIEWS RIGHT   Abdominal aortic aneurysm (AAA) without rupture (HCC)   Followed by cardiology.       Vapes nicotine  containing substance   Congratulated on fully quitting vaping this past year      Scrotal edema   Describes more swelling to foreskin and penis, overall stable exam, pending urology eval.  Update UA - normal today.       Other Visit Diagnoses  Incomplete emptying of bladder       Relevant Orders   POCT Urinalysis Dipstick (Automated) (Completed)        No orders of the defined types were placed in this encounter.   Orders Placed This Encounter  Procedures   DG HIP UNILAT W OR W/O PELVIS 2-3 VIEWS RIGHT    Standing Status:   Future    Number of Occurrences:   1    Expiration Date:   11/23/2024    Reason for Exam (SYMPTOM  OR DIAGNOSIS REQUIRED):   R hip pain eval arthritis    Preferred imaging location?:   [facility]   POCT Urinalysis Dipstick (Automated)    Patient Instructions  Right hip xray today  Urinalysis today. Cut down on dark sodas, caffeine, spicy foods which can all irritate the bladder.  Incorporate walking into routine. Goal 150 mins/wk of moderate intensity aerobic exercise.  Good to see you today.  Congratulations on stopping vaping! Return as needed or in 1 year for next physical   Follow up plan: Return in about 1 year (around 05/24/2025), or if symptoms worsen or fail to improve, for annual exam, prior fasting for blood work, medicare wellness visit.  Claire Crick, MD

## 2024-05-24 NOTE — Telephone Encounter (Signed)
 Discussed at OV.  Still awaiting results.

## 2024-05-27 ENCOUNTER — Encounter: Payer: Self-pay | Admitting: Family Medicine

## 2024-05-27 NOTE — Assessment & Plan Note (Signed)
 Anticipate OA related pain - update hip films today

## 2024-05-27 NOTE — Assessment & Plan Note (Signed)
 Preventative protocols reviewed and updated unless pt declined. Discussed healthy diet and lifestyle.

## 2024-05-27 NOTE — Assessment & Plan Note (Signed)
Continue PRN sildenafil

## 2024-05-27 NOTE — Assessment & Plan Note (Signed)
 Continue aspirin, statin.

## 2024-05-27 NOTE — Assessment & Plan Note (Addendum)
 Noted on imaging, pt overall asxs off med.

## 2024-05-27 NOTE — Assessment & Plan Note (Signed)
 Discussed weight gain noted after quitting vaping Encouraged healthy diet and lifestyle choices to affect sustainable weight loss

## 2024-05-27 NOTE — Assessment & Plan Note (Addendum)
 Congratulated on fully quitting vaping this past year

## 2024-05-27 NOTE — Assessment & Plan Note (Signed)
 Previously discussed.

## 2024-05-27 NOTE — Assessment & Plan Note (Signed)
Encouraged limiting added sugars in diet.  

## 2024-05-27 NOTE — Assessment & Plan Note (Signed)
 Mild murmur, stable.  Pending updated echo 2025

## 2024-05-27 NOTE — Assessment & Plan Note (Signed)
 Followed by cardiology

## 2024-05-27 NOTE — Assessment & Plan Note (Signed)
 Chronic, stable period on atorvastatin  40mg  daily with non-HDL 100 - continue. The 10-year ASCVD risk score (Arnett DK, et al., 2019) is: 19.6%   Values used to calculate the score:     Age: 73 years     Sex: Male     Is Non-Hispanic African American: No     Diabetic: No     Tobacco smoker: No     Systolic Blood Pressure: 148 mmHg     Is BP treated: No     HDL Cholesterol: 51.9 mg/dL     Total Cholesterol: 152 mg/dL

## 2024-05-27 NOTE — Assessment & Plan Note (Addendum)
 Describes more swelling to foreskin and penis, overall stable exam, pending urology eval.  Update UA - normal today.

## 2024-05-27 NOTE — Assessment & Plan Note (Signed)
 I have personally reviewed the Medicare Annual Wellness questionnaire and have noted 1. The patient's medical and social history 2. Their use of alcohol, tobacco or illicit drugs 3. Their current medications and supplements 4. The patient's functional ability including ADL's, fall risks, home safety risks and hearing or visual impairment. Cognitive function has been assessed and addressed as indicated.  5. Diet and physical activity 6. Evidence for depression or mood disorders The patients weight, height, BMI have been recorded in the chart. I have made referrals, counseling and provided education to the patient based on review of the above and I have provided the pt with a written personalized care plan for preventive services. Provider list updated.. See scanned questionairre as needed for further documentation. Reviewed preventative protocols and updated unless pt declined.

## 2024-05-27 NOTE — Assessment & Plan Note (Signed)
 Minimal on prior imaging 2017. Continue to monitor.

## 2024-05-27 NOTE — Assessment & Plan Note (Signed)
 Quit vaping 01/2024. Continue lung cancer screening.

## 2024-05-30 ENCOUNTER — Ambulatory Visit: Attending: Cardiology

## 2024-05-30 DIAGNOSIS — I359 Nonrheumatic aortic valve disorder, unspecified: Secondary | ICD-10-CM | POA: Diagnosis not present

## 2024-05-30 LAB — ECHOCARDIOGRAM COMPLETE
AR max vel: 1.93 cm2
AV Area VTI: 1.99 cm2
AV Area mean vel: 1.88 cm2
AV Mean grad: 18 mmHg
AV Peak grad: 33.8 mmHg
Ao pk vel: 2.91 m/s
Area-P 1/2: 3.03 cm2
Calc EF: 61 %
P 1/2 time: 622 ms
S' Lateral: 3.4 cm
Single Plane A2C EF: 61.2 %
Single Plane A4C EF: 61.6 %

## 2024-05-31 ENCOUNTER — Encounter: Payer: Self-pay | Admitting: Family Medicine

## 2024-06-03 MED ORDER — DICLOFENAC SODIUM 75 MG PO TBEC: 75.0000 mg | DELAYED_RELEASE_TABLET | Freq: Two times a day (BID) | ORAL | 0 refills | Status: DC | PRN

## 2024-06-04 NOTE — Progress Notes (Signed)
 Heart squeeze is noted 60 to 65%, no wall motion abnormalities were noted, right and left upper chamber of the heart is mildly dilated, there is mild to moderate leakage noted in the aortic valve and moderate narrowing of the aortic valve, no stenosis.  Continue with annual surveillance studies for aortic stenosis.

## 2024-06-05 ENCOUNTER — Other Ambulatory Visit: Payer: Self-pay | Admitting: Acute Care

## 2024-06-05 DIAGNOSIS — Z87891 Personal history of nicotine dependence: Secondary | ICD-10-CM

## 2024-06-05 DIAGNOSIS — Z122 Encounter for screening for malignant neoplasm of respiratory organs: Secondary | ICD-10-CM

## 2024-06-17 ENCOUNTER — Ambulatory Visit: Admitting: Urology

## 2024-06-17 VITALS — BP 162/71 | HR 54 | Ht 67.0 in | Wt 235.0 lb

## 2024-06-17 DIAGNOSIS — N4829 Other inflammatory disorders of penis: Secondary | ICD-10-CM

## 2024-06-17 NOTE — Progress Notes (Signed)
 06/17/24 12:33 PM   Johnny Holland 21-Aug-1951 [id number]  CC: Penile concerns  HPI: 73 year old uncircumcised male here with concerns about his penis.  He is gained about 30 pounds in the last 6 months, and since that time has noticed some irritation of the foreskin and decrease in penile length.  He has tried nystatin  and Vagistat with no improvement.  He uses sildenafil  with good results for ED.  He drinks only soda during the day which contributes to frequency and nocturia.  He is not interested in circumcision.   PMH: Past Medical History:  Diagnosis Date   Allergy    Hayfever   Aortic regurgitation 10/15/2012   Echocardiogram showing calcified aortic valves, mild AS, mod AR, preserved EF 55-65%, mild LV dilation and LVH (02/2017). rec rpt 1 yr.    Arthritis    CAD (coronary artery disease) 01/2017   4v by CT   Carotid stenosis 01/12/2016   Minimal on US  (01/2016) f/u PRN    COPD (chronic obstructive pulmonary disease) (HCC) 01/2017   mild paraseptal and centrilobular by CT   COVID-19 virus infection 11/2020   Diverticulitis    Diverticulosis of colon    Duodenal ulcer    Heart murmur    Hepatic steatosis 01/2017   by CT   History of smoking    HLD (hyperlipidemia)    Right shoulder pain    impingement with partial RTC tear ([place])   Right-sided Bell's palsy 12/06/2018   Thoracic aorta atherosclerosis (HCC) 01/2017   by CT    Surgical History: Past Surgical History:  Procedure Laterality Date   CARDIOVASCULAR STRESS TEST  04/2018   low risk study, no ischemia   COLONOSCOPY  11/30/06   diverticulosis; no polyps   COLONOSCOPY WITH PROPOFOL  N/A 04/13/2020   TA x2, divrticulosis, rpt 5 yrs (Vanga, Corinn Skiff, MD)   INCISE AND DRAIN ABCESS     Hemmorhoid   VASECTOMY     x 2    Family History: Family History  Problem Relation Age of Onset   Cirrhosis Father        + alcohol   Heart failure Father    Emphysema Father        + smoker   Heart attack  Father 76       x 4   Alcohol abuse Father    COPD Mother        + smoker   Macular degeneration Mother    Cataracts Mother        Bilateral--removed   Diabetes Paternal Grandmother    Breast cancer Maternal Aunt    Depression Sister    Alcohol abuse Sister    Alzheimer's disease Other        Aunt    Social History:  reports that he quit smoking about 8 months ago. His smoking use included e-cigarettes and cigarettes. He started smoking about 54 years ago. He has a 42 pack-year smoking history. He has never used smokeless tobacco. He reports current alcohol use of about 1.0 standard drink of alcohol per week. He reports that he does not use drugs.  Physical Exam: BP (!) 162/71   Pulse (!) 54   Ht 5' 7 (1.702 m)   Wt 235 lb (106.6 kg)   BMI 36.81 kg/m    Constitutional:  Alert and oriented, No acute distress. Cardiovascular: No clubbing, cyanosis, or edema. Respiratory: Normal respiratory effort, no increased work of breathing. 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  We discussed weight loss, he is not interested in circumcision.  Follow-up with urology as needed   Redell Burnet, MD 06/17/2024  [facility] [address] [place], [place] [address] [phone]

## 2024-06-17 NOTE — Patient Instructions (Signed)
 Nocturia refers to the need to wake up during the night to urinate, which can disrupt your sleep and impact your overall well-being. Fortunately, there are several strategies you can employ to help prevent or manage nocturia. It's important to consult with your healthcare provider before making any significant changes to your routine. Here are some helpful strategies to consider:  Limit Fluid Intake Before Bed: Avoid drinking large amounts of fluids in the evening, especially within a few hours of bedtime. Consume most of your daily fluid intake earlier in the day to reduce the need to urinate at night.  Monitor Your Diet: Limit your intake of caffeine and alcohol, as these substances can increase urine production and irritate the bladder.  Avoid diet, "zero calorie," and artificially sweetened drinks, especially sodas, in the afternoon or evening. Be mindful of consuming foods and drinks with high water content before bedtime, such as watermelon and herbal teas.  Time Your Medications: If you're taking medications that contribute to increased urination, consult your healthcare provider about adjusting the timing of these medications to minimize their impact during the night.  Practice Double Voiding: Before going to bed, make an effort to empty your bladder twice within a short period. This can help reduce the amount of urine left in your bladder before sleep.  Bladder Training: Gradually increase the time between bathroom visits during the day to train your bladder to hold larger volumes of urine. Over time, this can help reduce the frequency of nighttime awakenings to urinate.  Elevate Your Legs During the Day: Elevating your legs during the day can help minimize fluid retention in your lower extremities, which might reduce nighttime urination.  Pelvic Floor Exercises: Strengthening your pelvic floor muscles through Kegel exercises can help improve bladder control and potentially reduce  the urge to urinate at night.  Create a Relaxing Bedtime Routine: Stress and anxiety can exacerbate nocturia. Engage in calming activities before bed, such as reading, listening to soothing music, or practicing relaxation techniques.  Stay Active: Engage in regular physical activity, but avoid intense exercise close to bedtime, as this can increase your body's demand for fluids.  Maintain a Healthy Weight: Excess weight can compress the bladder and contribute to bladder and urinary issues. Aim to achieve and maintain a healthy weight through a balanced diet and regular exercise.  Remember that every individual is unique, and the effectiveness of these strategies may vary. It's important to work with your healthcare provider to develop a plan that suits your specific needs and addresses any underlying causes of nocturia.

## 2024-07-03 ENCOUNTER — Other Ambulatory Visit: Payer: Self-pay | Admitting: Family Medicine

## 2024-08-04 ENCOUNTER — Encounter: Payer: Self-pay | Admitting: Family Medicine

## 2024-08-04 DIAGNOSIS — N529 Male erectile dysfunction, unspecified: Secondary | ICD-10-CM

## 2024-08-05 ENCOUNTER — Telehealth: Payer: Self-pay

## 2024-08-05 MED ORDER — SILDENAFIL CITRATE 20 MG PO TABS: 40.0000 mg | ORAL_TABLET | Freq: Every day | ORAL | 0 refills | Status: AC | PRN

## 2024-08-05 NOTE — Telephone Encounter (Signed)
 Pharmacy Patient Advocate Encounter   Received notification from CoverMyMeds that prior authorization for Sildenafil  Citrate (PAH) 20MG  tablets  is required/requested.   Insurance verification completed.   The patient is insured through [facility] .   Per test claim: PA required; PA submitted to above mentioned insurance via Latent Key/confirmation #/EOC [id number] Status is pending

## 2024-08-06 ENCOUNTER — Other Ambulatory Visit (HOSPITAL_COMMUNITY): Payer: Self-pay

## 2024-08-07 ENCOUNTER — Other Ambulatory Visit (HOSPITAL_COMMUNITY): Payer: Self-pay

## 2024-08-07 NOTE — Telephone Encounter (Signed)
 Pharmacy Patient Advocate Encounter  Received notification from [employer] that Prior Authorization for Sildenafil  Citrate (PAH) 20MG  tablets  has been DENIED.  See denial reason below. No denial letter attached in CMM. Will attach denial letter to Media tab once received.     PA #/Case ID/Reference #: [id number]

## 2024-08-07 NOTE — Telephone Encounter (Signed)
 Pt pays out of pocket for this Rx. Pt is aware this will not be covered by insurance at this dose for this indication.

## 2024-11-26 ENCOUNTER — Telehealth: Payer: Self-pay | Admitting: Family Medicine

## 2024-11-26 MED ORDER — AMOXICILLIN 875 MG PO TABS: 875.0000 mg | ORAL_TABLET | Freq: Two times a day (BID) | ORAL | 0 refills | Status: AC

## 2024-11-26 NOTE — Telephone Encounter (Signed)
 Called and spoke with patient.  Pt has started prescription.  States he hasn't had scratchy throat yet but is taking the medication.

## 2024-11-26 NOTE — Telephone Encounter (Addendum)
 Please update patient later this AM.  Wife was positive for strep.  He has similar sx.  Verify current sx and med allergy hx.  Rx sent for amoxil .  Thanks.

## 2024-12-06 ENCOUNTER — Ambulatory Visit: Admitting: Family Medicine

## 2024-12-06 ENCOUNTER — Encounter: Payer: Self-pay | Admitting: Family Medicine

## 2024-12-06 VITALS — BP 120/58 | HR 59 | Temp 99.0°F | Ht 67.0 in | Wt 228.2 lb

## 2024-12-06 DIAGNOSIS — R509 Fever, unspecified: Secondary | ICD-10-CM

## 2024-12-06 LAB — POCT INFLUENZA A/B
Influenza A, POC: NEGATIVE
Influenza B, POC: NEGATIVE

## 2024-12-06 LAB — POC COVID19 BINAXNOW: SARS Coronavirus 2 Ag: NEGATIVE

## 2024-12-06 NOTE — Progress Notes (Signed)
 "     Established patient visit   Patient: Johnny Holland   DOB: Oct 29, 1951   73 y.o. Male  MRN: [id number] Visit Date: 12/06/2024  Today's healthcare provider: Nancyann Perry, MD   Chief Complaint  Patient presents with   Headache    Patient is present with headache, fever x over 1 week Patient's wife had strep, patient was given abx along with wife since he had similar symptoms. On last day patient's fever spiked, patient is doing better today and headache is better today as well    Fever   Subjective    Discussed the use of AI scribe software for clinical note transcription with the patient, who gave verbal consent to proceed.  History of Present Illness   Johnny Holland is a 73 year old male who presents with recurrent fever and scalp pain.  He began experiencing a fever approximately one week ago, coinciding with his wife's illness. Both were prescribed amoxicillin , although he was not seen by a doctor at that time. His fever initially resolved within two days of starting the antibiotic but recurred on the last day of the course, approximately two days ago. The fever was 100F on the first day, 101F on the second day, and 48F yesterday. He does not believe he has a fever today.  He describes scalp pain as soreness rather than a headache, with the pain being diffuse and affecting the entire scalp. Aspirin  has been effective in alleviating the pain. No tingling sensation, sinus aches, or pain behind the eyes. He has not experienced a cough, sore throat, or any other symptoms typically associated with respiratory infections.  He has a history of aortic valve stenosis and is preparing for hip surgery in February. He uses hearing aids but has them removed during the visit due to mask interference. He has not noticed any rashes and attributes scalp sensitivity to sun exposure over the years. He recalls similar episodes of fever following visits to his granddaughter's basketball  games.       Medications: Show/hide medication list[1] Review of Systems     Objective    BP (!) 120/58 (BP Location: Right Arm, Patient Position: Sitting, Cuff Size: Normal)   Pulse (!) 59   Temp 99 F (37.2 C) (Oral)   Ht 5' 7 (1.702 m)   Wt 228 lb 3.2 oz (103.5 kg)   SpO2 96%   BMI 35.74 kg/m   Physical Exam   General Appearance:    Obese male, alert, cooperative, in no acute distress  HENT:   ENT exam normal, no neck nodes or sinus tenderness, bilateral TM normal without fluid or infection, neck without nodes, throat normal without erythema or exudate, and sinuses nontender  Eyes:    PERRL, conjunctiva/corneas clear, EOM's intact       Lungs:     Clear to auscultation bilaterally, respirations unlabored  Heart:    Bradycardic. Normal rhythm.  2/6 systolic murmur at right upper sternal border   Neurologic:   Awake, alert, oriented x 3. No apparent focal neurological           defect.       Results for orders placed or performed in visit on 12/06/24  POCT Influenza A/B  Result Value Ref Range   Influenza A, POC Negative Negative   Influenza B, POC Negative Negative  POC COVID-19 BinaxNow  Result Value Ref Range   SARS Coronavirus 2 Ag Negative Negative     Assessment &  Plan       Fever of unknown origin Intermittent fever likely viral, coinciding with wife's illness. Negative COVID, flu, and strep tests. No current infection symptoms. - Return for evaluation if fever recurs.         Nancyann Perry, MD  [facility] [phone] (phone) [phone] (fax)  [facility]    [1]  Outpatient Medications Prior to Visit  Medication Sig   aspirin  81 MG tablet Take 1 tablet (81 mg total) by mouth daily.   atorvastatin  (LIPITOR) 40 MG tablet Take 1 tablet (40 mg total) by mouth daily.   diclofenac  (VOLTAREN ) 75 MG EC tablet TAKE 1 TABLET (75 MG TOTAL) BY MOUTH 2 (TWO) TIMES DAILY AS NEEDED FOR MODERATE PAIN (PAIN SCORE  4-6).   Multiple Vitamin (MULTI-VITAMIN DAILY PO) Take by mouth.   scopolamine  (TRANSDERM-SCOP) 1 MG/3DAYS Place 1 patch (1.5 mg total) onto the skin every 3 (three) days. (Patient taking differently: Place 1 patch onto the skin every 3 (three) days. As needed)   sildenafil  (REVATIO ) 20 MG tablet Take 2-5 tablets (40-100 mg total) by mouth daily as needed (relations).   No facility-administered medications prior to visit.   "

## 2024-12-24 ENCOUNTER — Telehealth (HOSPITAL_BASED_OUTPATIENT_CLINIC_OR_DEPARTMENT_OTHER): Payer: Self-pay | Admitting: *Deleted

## 2024-12-24 ENCOUNTER — Telehealth: Payer: Self-pay | Admitting: Cardiovascular Disease

## 2024-12-24 NOTE — Telephone Encounter (Signed)
 Pt has been scheduled tele preop appt 01/07/25. Pt scheduled for surgery 02/20/25 at this time, but tells me that he is on a wait list for sooner surgery date.   Med rec and consent are done.

## 2024-12-24 NOTE — Telephone Encounter (Signed)
"  ° °  Pre-operative Risk Assessment    Patient Name: Johnny Holland  DOB: 02-10-1951 MRN: [id number]   Date of last office visit: 04/29/24 Date of next office visit: TBD   Request for Surgical Clearance    Procedure:  Right THA interior hip  Date of Surgery:  Clearance 02/20/25                                Surgeon:  Lynwood Sick, MD Surgeon's Group or Practice Name:  [facility] Phone number:  [phone] Fax number:  [phone]   Type of Clearance Requested:   - Pharmacy:  Hold Aspirin  81 mg   Type of Anesthesia:  Local / Spinal   Additional requests/questions:    Bonney Audrene LOISE Agapito   12/24/2024, 8:50 AM   "

## 2024-12-24 NOTE — Telephone Encounter (Signed)
" ° °  Name: Johnny Holland  DOB: 1951-01-31  MRN: [id number]  Primary Cardiologist: Deatrice Cage, MD Last OV 04/29/24  Preoperative team, please contact this patient and set up a phone call appointment for further preoperative risk assessment. Please obtain consent and complete medication review. Thank you for your help.  I confirm that guidance regarding antiplatelet and oral anticoagulation therapy has been completed and, if necessary, noted below.  Regarding ASA therapy, we recommend continuation of ASA throughout the perioperative period.  However, if the surgeon feels that cessation of ASA is required in the perioperative period, it may be stopped 5-7 days prior to surgery with a plan to resume it as soon as felt to be feasible from a surgical standpoint in the post-operative period.   I also confirmed the patient resides in the state of [place] . As per [facility] Medical Board telemedicine laws, the patient must reside in the state in which the provider is licensed.  Peace Jost D Birgit Nowling, NP 12/24/2024, 9:55 AM [facility]    "

## 2024-12-24 NOTE — Telephone Encounter (Signed)
 Pt has been scheduled tele preop appt 01/07/25. Pt scheduled for surgery 02/20/25 at this time, but tells me that he is on a wait list for sooner surgery date.   Med rec and consent are done.       Patient Consent for Virtual Visit        Johnny Holland has provided verbal consent on 12/24/2024 for a virtual visit (video or telephone).   CONSENT FOR VIRTUAL VISIT FOR:  Johnny Holland  By participating in this virtual visit I agree to the following:  I hereby voluntarily request, consent and authorize [facility] and its employed or contracted physicians, physician assistants, nurse practitioners or other licensed health care professionals (the Practitioner), to provide me with telemedicine health care services (the Services) as deemed necessary by the treating Practitioner. I acknowledge and consent to receive the Services by the Practitioner via telemedicine. I understand that the telemedicine visit will involve communicating with the Practitioner through live audiovisual communication technology and the disclosure of certain medical information by electronic transmission. I acknowledge that I have been given the opportunity to request an in-person assessment or other available alternative prior to the telemedicine visit and am voluntarily participating in the telemedicine visit.  I understand that I have the right to withhold or withdraw my consent to the use of telemedicine in the course of my care at any time, without affecting my right to future care or treatment, and that the Practitioner or I may terminate the telemedicine visit at any time. I understand that I have the right to inspect all information obtained and/or recorded in the course of the telemedicine visit and may receive copies of available information for a reasonable fee.  I understand that some of the potential risks of receiving the Services via telemedicine include:  Delay or interruption in medical evaluation due to  technological equipment failure or disruption; Information transmitted may not be sufficient (e.g. poor resolution of images) to allow for appropriate medical decision making by the Practitioner; and/or  In rare instances, security protocols could fail, causing a breach of personal health information.  Furthermore, I acknowledge that it is my responsibility to provide information about my medical history, conditions and care that is complete and accurate to the best of my ability. I acknowledge that Practitioner's advice, recommendations, and/or decision may be based on factors not within their control, such as incomplete or inaccurate data provided by me or distortions of diagnostic images or specimens that may result from electronic transmissions. I understand that the practice of medicine is not an exact science and that Practitioner makes no warranties or guarantees regarding treatment outcomes. I acknowledge that a copy of this consent can be made available to me via my patient portal [facility] My[facility]), or I can request a printed copy by calling the office of  [facility].    I understand that my insurance will be billed for this visit.   I have read or had this consent read to me. I understand the contents of this consent, which adequately explains the benefits and risks of the Services being provided via telemedicine.  I have been provided ample opportunity to ask questions regarding this consent and the Services and have had my questions answered to my satisfaction. I give my informed consent for the services to be provided through the use of telemedicine in my medical care

## 2025-01-01 ENCOUNTER — Encounter: Payer: Self-pay | Admitting: Family Medicine

## 2025-01-01 ENCOUNTER — Telehealth: Payer: Self-pay

## 2025-01-01 NOTE — Telephone Encounter (Signed)
 Pt needs pre-op appt for [facility]. Surgery is tentatively scheduled for 02-20-2025. Clearance form for [facility] is in my pending folder to be completed upon appt date.

## 2025-01-02 NOTE — Telephone Encounter (Signed)
I called and LVM for pt.

## 2025-01-07 ENCOUNTER — Ambulatory Visit: Admitting: Emergency Medicine

## 2025-01-07 DIAGNOSIS — Z01810 Encounter for preprocedural cardiovascular examination: Secondary | ICD-10-CM | POA: Diagnosis not present

## 2025-01-07 NOTE — Progress Notes (Signed)
 "   Virtual Visit via Telephone Note   Because of Johnny Holland co-morbid illnesses, he is at least at moderate risk for complications without adequate follow up.  This format is felt to be most appropriate for this patient at this time.  Due to technical limitations with video connection (technology), today's appointment will be conducted as an audio only telehealth visit, and Johnny Holland verbally agreed to proceed in this manner.   All issues noted in this document were discussed and addressed.  No physical exam could be performed with this format.  Evaluation Performed:  Preoperative cardiovascular risk assessment _____________   Date:  01/07/2025   Patient ID:  Johnny Holland, DOB [birth year], MRN [id number] Patient Location:  Home Provider location:   Office  Primary Care Provider:  Rilla Baller, MD Primary Cardiologist:  Deatrice Cage, MD  Chief Complaint / Patient Profile   74 y.o. y/o male with a h/o nonobstructive coronary artery disease, aortic valve disease, mild to moderate aortic valve regurgitation, abdominal aortic aneurysm, sinus bradycardia, COPD, tobacco use, hyperlipidemia, Bell's palsy, diverticulosis without diverticulitis, fatty liver disease, obesity, erectile dysfunction who is pending right THA anterior hip on 02/20/2025 with [facility] and presents today for telephonic preoperative cardiovascular risk assessment.  History of Present Illness    Johnny Holland is a 74 y.o. male who presents via audio/video conferencing for a telehealth visit today.  Pt was last seen in cardiology clinic on 04/29/2024 by Tylene Lunch, NP.  At that time Johnny Holland was doing well.  The patient is now pending procedure as outlined above. Since his last visit, he is doing well without acute cardiovascular concerns or complaints.  Remains quite active without any exertional symptoms.  He is currently swimming for exercise.  He denies any chest pains, dyspnea, syncope, palpitations,  or dizziness.  No symptoms to suggest angina.  He is able to complete greater than 4 METS.  Past Medical History    Past Medical History:  Diagnosis Date   Allergy    Hayfever   Aortic regurgitation 10/15/2012   Echocardiogram showing calcified aortic valves, mild AS, mod AR, preserved EF 55-65%, mild LV dilation and LVH (02/2017). rec rpt 1 yr.    Arthritis    CAD (coronary artery disease) 01/2017   4v by CT   Carotid stenosis 01/12/2016   Minimal on US  (01/2016) f/u PRN    COPD (chronic obstructive pulmonary disease) (HCC) 01/2017   mild paraseptal and centrilobular by CT   COVID-19 virus infection 11/2020   Diverticulitis    Diverticulosis of colon    Duodenal ulcer    Heart murmur    Hepatic steatosis 01/2017   by CT   History of smoking    HLD (hyperlipidemia)    Right shoulder pain    impingement with partial RTC tear ([place])   Right-sided Bell's palsy 12/06/2018   Thoracic aorta atherosclerosis 01/2017   by CT   Past Surgical History:  Procedure Laterality Date   CARDIOVASCULAR STRESS TEST  04/2018   low risk study, no ischemia   COLONOSCOPY  11/30/06   diverticulosis; no polyps   COLONOSCOPY WITH PROPOFOL  N/A 04/13/2020   TA x2, divrticulosis, rpt 5 yrs (Vanga, Corinn Skiff, MD)   INCISE AND DRAIN ABCESS     Hemmorhoid   VASECTOMY     x 2    Allergies  Allergies[1]  Home Medications    Prior to Admission medications  Medication Sig Start Date End Date  Taking? Authorizing Provider  aspirin  81 MG tablet Take 1 tablet (81 mg total) by mouth daily. 02/27/18   Rilla Baller, MD  atorvastatin  (LIPITOR) 40 MG tablet Take 1 tablet (40 mg total) by mouth daily. 04/29/24   Gerard Frederick, NP  diclofenac  (VOLTAREN ) 75 MG EC tablet TAKE 1 TABLET (75 MG TOTAL) BY MOUTH 2 (TWO) TIMES DAILY AS NEEDED FOR MODERATE PAIN (PAIN SCORE 4-6). 07/04/24   Rilla Baller, MD  Multiple Vitamin (MULTI-VITAMIN DAILY PO) Take by mouth.    [provider]  scopolamine   (TRANSDERM-SCOP) 1 MG/3DAYS Place 1 patch (1.5 mg total) onto the skin every 3 (three) days. Patient taking differently: Place 1 patch onto the skin every 3 (three) days. As needed 05/24/23   Rilla Baller, MD  sildenafil  (REVATIO ) 20 MG tablet Take 2-5 tablets (40-100 mg total) by mouth daily as needed (relations). 08/05/24   Rilla Baller, MD    Physical Exam    Vital Signs:  Johnny Holland does not have vital signs available for review today.  Given telephonic nature of communication, physical exam is limited. AAOx3. NAD. Normal affect.  Speech and respirations are unlabored.  Accessory Clinical Findings    None  Assessment & Plan    1.  Preoperative Cardiovascular Risk Assessment: According to the Revised Cardiac Risk Index (RCRI), his Perioperative Risk of Major Cardiac Event is (%): 0.4. His Functional Capacity in METs is: 7.28 according to the [facility] Activity Status Index (DASI).  The patient was advised that if he develops new symptoms prior to surgery to contact our office to arrange for a follow-up visit, and he verbalized understanding.  Regarding ASA therapy, we recommend continuation of ASA throughout the perioperative period.  However, if the surgeon feels that cessation of ASA is required in the perioperative period, it may be stopped 5-7 days prior to surgery with a plan to resume it as soon as felt to be feasible from a surgical standpoint in the post-operative period.   A copy of this note will be routed to requesting surgeon.  Time:   Today, I have spent 10 minutes with the patient with telehealth technology discussing medical history, symptoms, and management plan.     Lum LITTIE Louis, NP  01/07/2025, 10:35 AM     [1]  Allergies Allergen Reactions   Erythromycin     REACTION: ANAPHYLACTIC SHOCK   "

## 2025-01-24 ENCOUNTER — Encounter: Payer: Self-pay | Admitting: Family Medicine

## 2025-01-24 ENCOUNTER — Ambulatory Visit: Admitting: Family Medicine

## 2025-01-24 ENCOUNTER — Ambulatory Visit: Admission: RE | Admit: 2025-01-24 | Source: Ambulatory Visit

## 2025-01-24 VITALS — BP 140/78 | HR 54 | Temp 98.0°F | Ht 67.0 in | Wt 233.0 lb

## 2025-01-24 DIAGNOSIS — Z87891 Personal history of nicotine dependence: Secondary | ICD-10-CM

## 2025-01-24 DIAGNOSIS — J432 Centrilobular emphysema: Secondary | ICD-10-CM

## 2025-01-24 DIAGNOSIS — M1611 Unilateral primary osteoarthritis, right hip: Secondary | ICD-10-CM

## 2025-01-24 DIAGNOSIS — I2510 Atherosclerotic heart disease of native coronary artery without angina pectoris: Secondary | ICD-10-CM

## 2025-01-24 DIAGNOSIS — Z01818 Encounter for other preprocedural examination: Secondary | ICD-10-CM

## 2025-01-24 DIAGNOSIS — R7303 Prediabetes: Secondary | ICD-10-CM

## 2025-01-24 DIAGNOSIS — R030 Elevated blood-pressure reading, without diagnosis of hypertension: Secondary | ICD-10-CM | POA: Insufficient documentation

## 2025-01-24 LAB — CBC WITH DIFFERENTIAL/PLATELET
Basophils Absolute: 0 10*3/uL (ref 0.0–0.1)
Basophils Relative: 0.4 % (ref 0.0–3.0)
Eosinophils Absolute: 0.2 10*3/uL (ref 0.0–0.7)
Eosinophils Relative: 2 % (ref 0.0–5.0)
HCT: 45.6 % (ref 39.0–52.0)
Hemoglobin: 15.4 g/dL (ref 13.0–17.0)
Lymphocytes Relative: 23.9 % (ref 12.0–46.0)
Lymphs Abs: 1.9 10*3/uL (ref 0.7–4.0)
MCHC: 33.8 g/dL (ref 30.0–36.0)
MCV: 87.5 fl (ref 78.0–100.0)
Monocytes Absolute: 0.9 10*3/uL (ref 0.1–1.0)
Monocytes Relative: 11.8 % (ref 3.0–12.0)
Neutro Abs: 4.8 10*3/uL (ref 1.4–7.7)
Neutrophils Relative %: 61.9 % (ref 43.0–77.0)
Platelets: 200 10*3/uL (ref 150.0–400.0)
RBC: 5.21 Mil/uL (ref 4.22–5.81)
RDW: 14 % (ref 11.5–15.5)
WBC: 7.8 10*3/uL (ref 4.0–10.5)

## 2025-01-24 LAB — COMPREHENSIVE METABOLIC PANEL WITH GFR
ALT: 16 U/L (ref 3–53)
AST: 18 U/L (ref 5–37)
Albumin: 4.2 g/dL (ref 3.5–5.2)
Alkaline Phosphatase: 80 U/L (ref 39–117)
BUN: 13 mg/dL (ref 6–23)
CO2: 32 meq/L (ref 19–32)
Calcium: 9.7 mg/dL (ref 8.4–10.5)
Chloride: 101 meq/L (ref 96–112)
Creatinine, Ser: 1.08 mg/dL (ref 0.40–1.50)
GFR: 69.2 mL/min
Glucose, Bld: 102 mg/dL — ABNORMAL HIGH (ref 70–99)
Potassium: 4.2 meq/L (ref 3.5–5.1)
Sodium: 138 meq/L (ref 135–145)
Total Bilirubin: 0.6 mg/dL (ref 0.2–1.2)
Total Protein: 7.3 g/dL (ref 6.0–8.3)

## 2025-01-24 LAB — POC URINALSYSI DIPSTICK (AUTOMATED)
Bilirubin, UA: NEGATIVE
Blood, UA: NEGATIVE
Glucose, UA: NEGATIVE
Ketones, UA: NEGATIVE
Leukocytes, UA: NEGATIVE
Nitrite, UA: NEGATIVE
Protein, UA: NEGATIVE
Spec Grav, UA: 1.02
Urobilinogen, UA: 0.2 U/dL
pH, UA: 6

## 2025-01-24 LAB — HEMOGLOBIN A1C: Hgb A1c MFr Bld: 5.9 % (ref 4.6–6.5)

## 2025-01-24 NOTE — Assessment & Plan Note (Signed)
 Quit vaping 09/2023

## 2025-01-24 NOTE — Patient Instructions (Addendum)
 Labs, EKG, chest xray today.  Urinalysis today.  Monitor blood pressures at home , let me know if consistently >140/90.  Cut down on caffeine. Look at [facility] diet handout.  Good to see you today Return as needed or in the summer for regular physical.

## 2025-01-24 NOTE — Assessment & Plan Note (Signed)
 BP above goal off antihypertensive. DASH diet handout provided. Rec cut back on caffeine.  Rec start monitoring BP at home and let us  know if consistently >140/90 for further treatment recommendations.

## 2025-01-24 NOTE — Progress Notes (Signed)
 " Ph: [phone] Fax: [phone]   Patient ID: Alm JONELLE Blush, male    DOB: Oct 06, 1951, 74 y.o.   MRN: [id number]  This visit was conducted in person.  BP (!) 140/78 (BP Location: Left Arm, Patient Position: Sitting, Cuff Size: Normal)   Pulse (!) 54   Temp 98 F (36.7 C) (Oral)   Ht 5' 7 (1.702 m)   Wt 233 lb (105.7 kg)   SpO2 94%   BMI 36.49 kg/m   BP Readings from Last 3 Encounters:  01/24/25 (!) 140/78  12/06/24 (!) 120/58  06/17/24 (!) 162/71  148/56 on retesting  CC: preop evaluation  Subjective:   HPI: KURTIS ANASTASIA is a 74 y.o. male presenting on 01/24/2025 for Medical Clearance (Getting clearance for right hip, surgery scheduled for March/Pt is in a lot of pain and is ready to get surgery over with//Pt drinks 5 12oz cans Diet Dr Nunzio)   Alm JONELLE Blush  has a past medical history of Allergy, Aortic regurgitation (10/15/2012), Arthritis, CAD (coronary artery disease) (01/2017), Carotid stenosis (01/12/2016), COPD (chronic obstructive pulmonary disease) (HCC) (01/2017), COVID-19 virus infection (11/2020), Diverticulitis, Diverticulosis of colon, Duodenal ulcer, Heart murmur, Hepatic steatosis (01/2017), History of smoking, HLD (hyperlipidemia), Right shoulder pain, Right-sided Bell's palsy (12/06/2018), and Thoracic aorta atherosclerosis (01/2017).  Planned upcoming R total hip replacement by Dr Leora Gaba scheduled for 02/20/2025.    Patient has not had general anesthesia previously Latest surgical intervention was - vasectomies and colonoscopy.  Denies trouble with post-op nausea/vomiting, or trouble awakening after colonoscopy.   Denies chest pain, dyspnea, palpitations, leg swelling, HA, dizziness.  No fevers/chills, coughing, abd pain, diarrhea, or UTI symptoms.  He received cardiac clearance - see phone note 01/07/2025. Recommend continued aspirin  perioperatively unless stop recommended by surgeon.   Revised cardiac risk index score = 1 Planned high  risk surgery (intraperitoneal, intrathoracic, or suprainguinal vascular): No History of ischemic heart disease: yes History of congestive heart failure: No History of cerebrovascular disease: No History of preoperative insulin treatment: No History of kidney disease with creatinine >2 mg/dL: No    He quit vaping 89/7975.  He chronically drinks five 12oz cans of Dr Nunzio per day.      Relevant past medical, surgical, family and social history reviewed and updated as indicated. Interim medical history since our last visit reviewed. Allergies and medications reviewed and updated. Outpatient Medications Prior to Visit  Medication Sig Dispense Refill   aspirin  81 MG tablet Take 1 tablet (81 mg total) by mouth daily.     atorvastatin  (LIPITOR) 40 MG tablet Take 1 tablet (40 mg total) by mouth daily. 90 tablet 3   Multiple Vitamin (MULTI-VITAMIN DAILY PO) Take by mouth.     scopolamine  (TRANSDERM-SCOP) 1 MG/3DAYS Place 1 patch (1.5 mg total) onto the skin every 3 (three) days. (Patient taking differently: Place 1 patch onto the skin every 3 (three) days. As needed) 10 patch 0   sildenafil  (REVATIO ) 20 MG tablet Take 2-5 tablets (40-100 mg total) by mouth daily as needed (relations). 30 tablet 0   diclofenac  (VOLTAREN ) 75 MG EC tablet TAKE 1 TABLET (75 MG TOTAL) BY MOUTH 2 (TWO) TIMES DAILY AS NEEDED FOR MODERATE PAIN (PAIN SCORE 4-6). (Patient not taking: Reported on 01/24/2025) 60 tablet 0   No facility-administered medications prior to visit.     Per HPI unless specifically indicated in ROS section below Review of Systems  Objective:  BP (!) 140/78 (BP Location: Left Arm, Patient Position:  Sitting, Cuff Size: Normal)   Pulse (!) 54   Temp 98 F (36.7 C) (Oral)   Ht 5' 7 (1.702 m)   Wt 233 lb (105.7 kg)   SpO2 94%   BMI 36.49 kg/m   Wt Readings from Last 3 Encounters:  01/24/25 233 lb (105.7 kg)  12/06/24 228 lb 3.2 oz (103.5 kg)  06/17/24 235 lb (106.6 kg)      Physical  Exam Vitals and nursing note reviewed.  Constitutional:      Appearance: Normal appearance. He is not ill-appearing.  HENT:     Head: Normocephalic and atraumatic.     Mouth/Throat:     Mouth: Mucous membranes are moist.     Pharynx: Oropharynx is clear. No oropharyngeal exudate or posterior oropharyngeal erythema.  Eyes:     Pupils: Pupils are equal, round, and reactive to light.  Cardiovascular:     Rate and Rhythm: Normal rate and regular rhythm.     Pulses: Normal pulses.     Heart sounds: Murmur (3/6 systolic throughout) heard.  Pulmonary:     Effort: Pulmonary effort is normal. No respiratory distress.     Breath sounds: Normal breath sounds. No wheezing, rhonchi or rales.  Musculoskeletal:     Cervical back: Normal range of motion and neck supple.     Right lower leg: No edema.     Left lower leg: No edema.  Skin:    General: Skin is warm and dry.     Findings: No rash.  Neurological:     General: No focal deficit present.     Mental Status: He is alert.  Psychiatric:        Mood and Affect: Mood normal.        Behavior: Behavior normal.       Results for orders placed or performed in visit on 01/24/25  POCT Urinalysis Dipstick (Automated)   Collection Time: 01/24/25 11:23 AM  Result Value Ref Range   Color, UA Yellow    Clarity, UA clear    Glucose, UA Negative Negative   Bilirubin, UA Negative    Ketones, UA Negative    Spec Grav, UA 1.020 1.010 - 1.025   Blood, UA Negative    pH, UA 6.0 5.0 - 8.0   Protein, UA Negative Negative   Urobilinogen, UA 0.2 0.2 or 1.0 E.U./dL   Nitrite, UA Negative    Leukocytes, UA Negative Negative   Lab Results  Component Value Date   HGBA1C 6.0 05/16/2024    Lab Results  Component Value Date   NA 137 05/16/2024   CL 104 05/16/2024   K 4.1 05/16/2024   CO2 28 05/16/2024   BUN 12 05/16/2024   CREATININE 1.00 05/16/2024   GFR 76.27 05/16/2024   CALCIUM  9.2 05/16/2024   ALBUMIN 3.9 05/16/2024   GLUCOSE 99  05/16/2024    Lab Results  Component Value Date   WBC 7.4 05/16/2024   HGB 14.2 05/16/2024   HCT 42.4 05/16/2024   MCV 86.7 05/16/2024   PLT 194.0 05/16/2024   Echocardiogram 05/2024 - EF 60-65%, no RWMA, bilateral atria mildly dilated, mild-mod AR, moderate aortic valve stenosis.   EKG today - sinus bradycardia 40s, LAD, normal intervals, q waves anteroseptally unchanged from previous EKGs, no acute ST/T changes.   Assessment & Plan:   Problem List Items Addressed This Visit     Personal history of tobacco use, presenting hazards to health   Severe obesity (BMI 35.0-39.9) with comorbidity (HCC)  Prediabetes   CAD (coronary artery disease)   On aspirin , statin. Has received cardiac clearance      COPD (chronic obstructive pulmonary disease) (HCC)   Pt asxs off respiratory medication Update CXR.       Osteoarthritis of right hip   Pre-op evaluation - Primary   Update labs, UA, EKG, CXR today and will forward results to orthopedic team.  Anticipate adequately low risk to proceed with upcoming R hip replacement from a medical standpoint.       Relevant Orders   Comprehensive metabolic panel with GFR   Hemoglobin A1c   CBC with Differential/Platelet   DG Chest 2 View   POCT Urinalysis Dipstick (Automated) (Completed)   EKG 12-Lead   Elevated blood pressure reading in office without diagnosis of hypertension   BP above goal off antihypertensive. DASH diet handout provided. Rec cut back on caffeine.  Rec start monitoring BP at home and let us  know if consistently >140/90 for further treatment recommendations.         No orders of the defined types were placed in this encounter.   Orders Placed This Encounter  Procedures   DG Chest 2 View    Standing Status:   Future    Number of Occurrences:   1    Expiration Date:   01/24/2026    Reason for Exam (SYMPTOM  OR DIAGNOSIS REQUIRED):   preop eval    Preferred imaging location?:   [facility]    Comprehensive metabolic panel with GFR   Hemoglobin A1c   CBC with Differential/Platelet   POCT Urinalysis Dipstick (Automated)   EKG 12-Lead    Patient Instructions  Labs, EKG, chest xray today.  Urinalysis today.  Monitor blood pressures at home , let me know if consistently >140/90.  Cut down on caffeine. Look at [facility] diet handout.  Good to see you today Return as needed or in the summer for regular physical.   Follow up plan: Return if symptoms worsen or fail to improve.  Anton Blas, MD   "

## 2025-01-24 NOTE — Assessment & Plan Note (Addendum)
 Update labs, UA, EKG, CXR today and will forward results to orthopedic team.  Anticipate adequately low risk to proceed with upcoming R hip replacement from a medical standpoint.

## 2025-01-24 NOTE — Assessment & Plan Note (Signed)
 Pt asxs off respiratory medication Update CXR.

## 2025-01-24 NOTE — Assessment & Plan Note (Signed)
 On aspirin , statin. Has received cardiac clearance

## 2025-06-26 ENCOUNTER — Other Ambulatory Visit

## 2025-07-01 ENCOUNTER — Encounter: Admitting: Family Medicine
